# Patient Record
Sex: Male | Born: 1940 | Race: Black or African American | Hispanic: No | Marital: Single | Smoking: Former smoker
Health system: Southern US, Community
[De-identification: ages and names within clinical notes are randomized; demographics above are authoritative.]

## PROBLEM LIST (undated history)

## (undated) DIAGNOSIS — N186 End stage renal disease: Secondary | ICD-10-CM

## (undated) DIAGNOSIS — Z8679 Personal history of other diseases of the circulatory system: Secondary | ICD-10-CM

## (undated) DIAGNOSIS — I96 Gangrene, not elsewhere classified: Secondary | ICD-10-CM

## (undated) DIAGNOSIS — I639 Cerebral infarction, unspecified: Secondary | ICD-10-CM

## (undated) DIAGNOSIS — Z8673 Personal history of transient ischemic attack (TIA), and cerebral infarction without residual deficits: Secondary | ICD-10-CM

## (undated) DIAGNOSIS — Z992 Dependence on renal dialysis: Secondary | ICD-10-CM

## (undated) DIAGNOSIS — F419 Anxiety disorder, unspecified: Secondary | ICD-10-CM

## (undated) DIAGNOSIS — M869 Osteomyelitis, unspecified: Secondary | ICD-10-CM

## (undated) DIAGNOSIS — I350 Nonrheumatic aortic (valve) stenosis: Secondary | ICD-10-CM

## (undated) DIAGNOSIS — K922 Gastrointestinal hemorrhage, unspecified: Secondary | ICD-10-CM

## (undated) DIAGNOSIS — Z8719 Personal history of other diseases of the digestive system: Secondary | ICD-10-CM

## (undated) DIAGNOSIS — I739 Peripheral vascular disease, unspecified: Secondary | ICD-10-CM

## (undated) DIAGNOSIS — Z9289 Personal history of other medical treatment: Secondary | ICD-10-CM

## (undated) DIAGNOSIS — Z8711 Personal history of peptic ulcer disease: Secondary | ICD-10-CM

## (undated) DIAGNOSIS — I10 Essential (primary) hypertension: Secondary | ICD-10-CM

## (undated) DIAGNOSIS — R011 Cardiac murmur, unspecified: Secondary | ICD-10-CM

## (undated) DIAGNOSIS — M1990 Unspecified osteoarthritis, unspecified site: Secondary | ICD-10-CM

## (undated) DIAGNOSIS — D509 Iron deficiency anemia, unspecified: Secondary | ICD-10-CM

## (undated) HISTORY — DX: Personal history of transient ischemic attack (TIA), and cerebral infarction without residual deficits: Z86.73

## (undated) HISTORY — PX: ARTERIOVENOUS GRAFT PLACEMENT: SUR1029

## (undated) HISTORY — PX: SHOULDER SURGERY: SHX246

## (undated) HISTORY — DX: Personal history of other diseases of the circulatory system: Z86.79

## (undated) HISTORY — DX: Nonrheumatic aortic (valve) stenosis: I35.0

## (undated) HISTORY — DX: Iron deficiency anemia, unspecified: D50.9

## (undated) HISTORY — PX: CHOLECYSTECTOMY: SHX55

## (undated) HISTORY — PX: ESOPHAGOGASTRODUODENOSCOPY: SHX1529

## (undated) HISTORY — DX: Essential (primary) hypertension: I10

---
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
## 1998-06-27 ENCOUNTER — Encounter: Payer: Self-pay | Admitting: Nephrology

## 1998-06-27 ENCOUNTER — Ambulatory Visit (HOSPITAL_COMMUNITY): Admission: RE | Admit: 1998-06-27 | Discharge: 1998-06-27 | Payer: Self-pay | Admitting: Nephrology

## 1998-07-11 ENCOUNTER — Observation Stay (HOSPITAL_COMMUNITY): Admission: RE | Admit: 1998-07-11 | Discharge: 1998-07-12 | Payer: Self-pay | Admitting: Nephrology

## 1998-07-11 ENCOUNTER — Encounter: Payer: Self-pay | Admitting: Nephrology

## 1999-04-20 ENCOUNTER — Encounter (HOSPITAL_COMMUNITY): Admission: RE | Admit: 1999-04-20 | Discharge: 1999-07-19 | Payer: Self-pay | Admitting: Nephrology

## 1999-06-15 ENCOUNTER — Encounter: Payer: Self-pay | Admitting: Nephrology

## 1999-06-15 ENCOUNTER — Inpatient Hospital Stay (HOSPITAL_COMMUNITY): Admission: EM | Admit: 1999-06-15 | Discharge: 1999-06-20 | Payer: Self-pay | Admitting: Emergency Medicine

## 1999-06-30 ENCOUNTER — Emergency Department (HOSPITAL_COMMUNITY): Admission: EM | Admit: 1999-06-30 | Discharge: 1999-06-30 | Payer: Self-pay | Admitting: Emergency Medicine

## 1999-12-11 ENCOUNTER — Encounter: Payer: Self-pay | Admitting: Vascular Surgery

## 1999-12-11 ENCOUNTER — Ambulatory Visit (HOSPITAL_COMMUNITY): Admission: RE | Admit: 1999-12-11 | Discharge: 1999-12-11 | Payer: Self-pay | Admitting: Nephrology

## 1999-12-26 ENCOUNTER — Ambulatory Visit (HOSPITAL_COMMUNITY): Admission: RE | Admit: 1999-12-26 | Discharge: 1999-12-26 | Payer: Self-pay | Admitting: *Deleted

## 1999-12-26 ENCOUNTER — Encounter: Payer: Self-pay | Admitting: *Deleted

## 2002-04-29 HISTORY — PX: COLONOSCOPY: SHX174

## 2002-05-19 ENCOUNTER — Emergency Department (HOSPITAL_COMMUNITY): Admission: EM | Admit: 2002-05-19 | Discharge: 2002-05-19 | Payer: Self-pay | Admitting: Internal Medicine

## 2002-06-24 ENCOUNTER — Encounter: Payer: Self-pay | Admitting: Internal Medicine

## 2002-06-24 ENCOUNTER — Ambulatory Visit (HOSPITAL_COMMUNITY): Admission: RE | Admit: 2002-06-24 | Discharge: 2002-06-24 | Payer: Self-pay | Admitting: Internal Medicine

## 2002-06-25 ENCOUNTER — Emergency Department (HOSPITAL_COMMUNITY): Admission: EM | Admit: 2002-06-25 | Discharge: 2002-06-25 | Payer: Self-pay | Admitting: Anesthesiology

## 2002-07-06 ENCOUNTER — Ambulatory Visit (HOSPITAL_COMMUNITY): Admission: RE | Admit: 2002-07-06 | Discharge: 2002-07-06 | Payer: Self-pay | Admitting: General Surgery

## 2002-11-28 HISTORY — PX: ESOPHAGOGASTRODUODENOSCOPY: SHX1529

## 2002-12-12 ENCOUNTER — Inpatient Hospital Stay (HOSPITAL_COMMUNITY): Admission: EM | Admit: 2002-12-12 | Discharge: 2002-12-16 | Payer: Self-pay | Admitting: Emergency Medicine

## 2002-12-12 ENCOUNTER — Encounter: Payer: Self-pay | Admitting: Emergency Medicine

## 2002-12-13 ENCOUNTER — Encounter: Payer: Self-pay | Admitting: Internal Medicine

## 2003-02-06 ENCOUNTER — Encounter: Payer: Self-pay | Admitting: Emergency Medicine

## 2003-02-06 ENCOUNTER — Emergency Department (HOSPITAL_COMMUNITY): Admission: EM | Admit: 2003-02-06 | Discharge: 2003-02-06 | Payer: Self-pay | Admitting: Emergency Medicine

## 2003-04-05 ENCOUNTER — Ambulatory Visit (HOSPITAL_COMMUNITY): Admission: RE | Admit: 2003-04-05 | Discharge: 2003-04-05 | Payer: Self-pay | Admitting: Internal Medicine

## 2003-05-24 ENCOUNTER — Ambulatory Visit (HOSPITAL_COMMUNITY): Admission: RE | Admit: 2003-05-24 | Discharge: 2003-05-24 | Payer: Self-pay | Admitting: Nephrology

## 2003-11-17 ENCOUNTER — Emergency Department (HOSPITAL_COMMUNITY): Admission: EM | Admit: 2003-11-17 | Discharge: 2003-11-17 | Payer: Self-pay | Admitting: Internal Medicine

## 2003-11-28 ENCOUNTER — Ambulatory Visit (HOSPITAL_COMMUNITY): Admission: RE | Admit: 2003-11-28 | Discharge: 2003-11-28 | Payer: Self-pay | Admitting: Nephrology

## 2004-04-10 ENCOUNTER — Ambulatory Visit (HOSPITAL_COMMUNITY): Admission: RE | Admit: 2004-04-10 | Discharge: 2004-04-10 | Payer: Self-pay | Admitting: Nephrology

## 2004-10-25 ENCOUNTER — Emergency Department (HOSPITAL_COMMUNITY): Admission: EM | Admit: 2004-10-25 | Discharge: 2004-10-25 | Payer: Self-pay | Admitting: Emergency Medicine

## 2005-05-10 ENCOUNTER — Emergency Department (HOSPITAL_COMMUNITY): Admission: EM | Admit: 2005-05-10 | Discharge: 2005-05-10 | Payer: Self-pay | Admitting: Emergency Medicine

## 2007-03-10 ENCOUNTER — Ambulatory Visit (HOSPITAL_COMMUNITY): Admission: RE | Admit: 2007-03-10 | Discharge: 2007-03-10 | Payer: Self-pay | Admitting: Nephrology

## 2007-06-17 ENCOUNTER — Ambulatory Visit (HOSPITAL_COMMUNITY): Admission: RE | Admit: 2007-06-17 | Discharge: 2007-06-17 | Payer: Self-pay | Admitting: Medical

## 2007-09-07 ENCOUNTER — Ambulatory Visit (HOSPITAL_COMMUNITY): Admission: RE | Admit: 2007-09-07 | Discharge: 2007-09-07 | Payer: Self-pay | Admitting: Nephrology

## 2007-10-25 ENCOUNTER — Emergency Department (HOSPITAL_COMMUNITY): Admission: EM | Admit: 2007-10-25 | Discharge: 2007-10-25 | Payer: Self-pay | Admitting: Emergency Medicine

## 2007-10-29 ENCOUNTER — Ambulatory Visit (HOSPITAL_COMMUNITY): Admission: RE | Admit: 2007-10-29 | Discharge: 2007-10-29 | Payer: Self-pay | Admitting: Internal Medicine

## 2008-03-01 ENCOUNTER — Ambulatory Visit (HOSPITAL_COMMUNITY): Admission: RE | Admit: 2008-03-01 | Discharge: 2008-03-01 | Payer: Self-pay | Admitting: Urology

## 2008-09-01 ENCOUNTER — Emergency Department (HOSPITAL_COMMUNITY): Admission: EM | Admit: 2008-09-01 | Discharge: 2008-09-01 | Payer: Self-pay | Admitting: Emergency Medicine

## 2008-09-03 ENCOUNTER — Emergency Department (HOSPITAL_COMMUNITY): Admission: EM | Admit: 2008-09-03 | Discharge: 2008-09-03 | Payer: Self-pay | Admitting: Emergency Medicine

## 2008-09-08 ENCOUNTER — Ambulatory Visit (HOSPITAL_COMMUNITY): Admission: RE | Admit: 2008-09-08 | Discharge: 2008-09-08 | Payer: Self-pay | Admitting: Urology

## 2009-05-15 ENCOUNTER — Emergency Department (HOSPITAL_COMMUNITY): Admission: EM | Admit: 2009-05-15 | Discharge: 2009-05-15 | Payer: Self-pay | Admitting: Emergency Medicine

## 2009-06-27 ENCOUNTER — Ambulatory Visit: Payer: Self-pay | Admitting: Vascular Surgery

## 2009-06-28 ENCOUNTER — Ambulatory Visit: Payer: Self-pay | Admitting: Vascular Surgery

## 2009-06-28 ENCOUNTER — Ambulatory Visit (HOSPITAL_COMMUNITY): Admission: RE | Admit: 2009-06-28 | Discharge: 2009-06-28 | Payer: Self-pay | Admitting: Vascular Surgery

## 2009-07-12 ENCOUNTER — Emergency Department (HOSPITAL_COMMUNITY): Admission: EM | Admit: 2009-07-12 | Discharge: 2009-07-13 | Payer: Self-pay | Admitting: Emergency Medicine

## 2009-07-15 ENCOUNTER — Emergency Department (HOSPITAL_COMMUNITY): Admission: EM | Admit: 2009-07-15 | Discharge: 2009-07-15 | Payer: Self-pay | Admitting: Emergency Medicine

## 2009-07-18 ENCOUNTER — Ambulatory Visit: Payer: Self-pay | Admitting: Vascular Surgery

## 2009-07-27 ENCOUNTER — Ambulatory Visit: Payer: Self-pay | Admitting: Vascular Surgery

## 2009-07-27 ENCOUNTER — Ambulatory Visit (HOSPITAL_COMMUNITY): Admission: RE | Admit: 2009-07-27 | Discharge: 2009-07-27 | Payer: Self-pay | Admitting: Vascular Surgery

## 2009-08-14 ENCOUNTER — Ambulatory Visit (HOSPITAL_COMMUNITY): Admission: RE | Admit: 2009-08-14 | Discharge: 2009-08-14 | Payer: Self-pay | Admitting: Nephrology

## 2009-08-24 ENCOUNTER — Ambulatory Visit: Payer: Self-pay | Admitting: Vascular Surgery

## 2009-10-23 ENCOUNTER — Encounter (HOSPITAL_COMMUNITY): Admission: RE | Admit: 2009-10-23 | Discharge: 2009-11-22 | Payer: Self-pay | Admitting: Nephrology

## 2009-11-01 ENCOUNTER — Emergency Department (HOSPITAL_COMMUNITY): Admission: EM | Admit: 2009-11-01 | Discharge: 2009-11-01 | Payer: Self-pay | Admitting: Emergency Medicine

## 2009-11-30 ENCOUNTER — Inpatient Hospital Stay (HOSPITAL_COMMUNITY)
Admission: EM | Admit: 2009-11-30 | Discharge: 2009-12-02 | Payer: Self-pay | Source: Home / Self Care | Admitting: Emergency Medicine

## 2009-11-30 ENCOUNTER — Ambulatory Visit: Payer: Self-pay | Admitting: Cardiology

## 2009-12-01 ENCOUNTER — Encounter (INDEPENDENT_AMBULATORY_CARE_PROVIDER_SITE_OTHER): Payer: Self-pay | Admitting: Internal Medicine

## 2010-05-07 ENCOUNTER — Ambulatory Visit
Admission: RE | Admit: 2010-05-07 | Discharge: 2010-05-07 | Payer: Self-pay | Source: Home / Self Care | Attending: Surgery | Admitting: Surgery

## 2010-05-11 ENCOUNTER — Ambulatory Visit (HOSPITAL_COMMUNITY)
Admission: RE | Admit: 2010-05-11 | Discharge: 2010-05-11 | Payer: Self-pay | Source: Home / Self Care | Attending: Surgery | Admitting: Surgery

## 2010-05-14 LAB — POCT I-STAT 4, (NA,K, GLUC, HGB,HCT)
Glucose, Bld: 92 mg/dL (ref 70–99)
HCT: 53 % — ABNORMAL HIGH (ref 39.0–52.0)
Hemoglobin: 18 g/dL — ABNORMAL HIGH (ref 13.0–17.0)
Potassium: 5.2 mEq/L — ABNORMAL HIGH (ref 3.5–5.1)
Sodium: 138 mEq/L (ref 135–145)

## 2010-05-14 LAB — SURGICAL PCR SCREEN
MRSA, PCR: NEGATIVE
Staphylococcus aureus: NEGATIVE

## 2010-05-17 ENCOUNTER — Ambulatory Visit
Admission: RE | Admit: 2010-05-17 | Discharge: 2010-05-17 | Payer: Self-pay | Source: Home / Self Care | Attending: Vascular Surgery | Admitting: Vascular Surgery

## 2010-05-18 NOTE — Assessment & Plan Note (Signed)
OFFICE VISIT  [name], [name] DOB:  1940/10/25                                       05/17/2010 CHART#:[id number]  CHIEF COMPLAINT:  Bilateral foot ulcers.  HISTORY OF PRESENT ILLNESS:  The patient has a history of multiple ulcerations on both feet.  Each of these usually started as a blister. These have been persistent for 6 to 8 months.  He has been followed at the [facility] and was referred here for further evaluation of nonhealing wounds.  He denies any pain in his feet.  He denies any claudication symptoms although he seems to be minimally ambulatory.  CHRONIC MEDICAL PROBLEMS:  Include end-stage renal disease for the last 12 to 15 years.  He has dialysis on Mondays, Wednesdays and Fridays at [facility].  He also has a history of chronic neuropathy and hypertension.  All of these problems are currently controlled and followed by his nephrologist as well as the wound healing center.  ABIs were recently performed at the [facility] which showed an ABI on the right side of 0.8 and on the left 0.9.  He had, however, only toe pressures of 31 bilaterally.  PAST SURGICAL HISTORY:  Right shoulder fracture.  SOCIAL HISTORY:  He is single.  He has 2 children.  He quit smoking 5 years ago but was a heavy smoker prior to this.  He does not consume alcohol regularly.  FAMILY HISTORY:  Is not remarkable for vascular disease at age less than 8.  REVIEW OF SYSTEMS:  GENERAL:  He has had a 15 pound weight loss over the last 3 months; he thinks this is due to anorexia from antibiotics. VASCULAR:  He had a TIA 15 years ago. GI:  He had peptic ulcer disease 3 to 4 years ago but he did not require an operation. CARDIAC:  He denies any recent chest pain or shortness of breath. URINARY:  He has chronic kidney disease and is anuric. All other systems were negative.  MEDICATIONS: 1. Include Sensipar 60 mg once a day. 2.  Fosrenol 500 mg a.c. 3. Rena-Vite 1 tablet daily. 4. Gabapentin 600 mg q.h.s. 5. Clonidine 0.2 mg b.i.d. 6. Norvasc 5 mg once a day.  ALLERGIES:  Aspirin from his previous history of GI bleed.  PHYSICAL EXAMINATION:  Vital signs:  Blood pressure is 138/82 in the left arm, heart rate is 101 and regular.  Temperature is 98.  HEENT: Unremarkable.  Neck:  Has 2+ carotid pulses without bruit.  Chest: Clear to auscultation.  Cardiac:  Exam is regular rate and rhythm without murmur.  Abdomen:  Soft, nontender, nondistended.  No masses. Musculoskeletal:  He has no obvious major joint deformities. Neurologic:  Upper extremity and lower extremity motor strength is 5/5 and symmetric.  He has a decreased sensation in the feet on the plantar aspect bilaterally.  Skin:  Has a right heel ulcer which is 4 x 2 cm in diameter and is granulating at the base.  The fifth toe has gangrene. It is dry.  The first metatarsal head on the right foot has a 3 x 2 cm ulceration which has some granulation tissue.  On the left foot he has a 3 x 2 cm ulceration on the first metatarsal head which is granulating. He has a left lateral malleolus ulcer which is essentially healed  at this point.  Peripheral vascular:  He has 2+ radial pulses bilaterally. He has a healing incision on his right wrist from recent fistula ligation for steal.  He is currently using a hemodialysis catheter. Lower extremities:  He has 2+ femoral and 2+ popliteal pulses bilaterally.  He has absent pedal pulses bilaterally.  I reviewed his noninvasive studies from [facility] which showed the ABIs as listed above as well as toe pressures.  His noninvasive testing suggested primarily tibial or possible small vessel disease.  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  I believe the best option for him would be aortogram, bilateral  lower extremity runoff, possible angioplasty and stenting if possible or possible consider direct revascularization. There may also be a possibility he has no distal targets or small vessel disease alone.  I discussed the procedure risks, benefits, possible complications with the patient today.  We have scheduled him for an arteriogram with lower extremity runoff, possible angioplasty and stenting with my partner, Dr. Bridgett Larsson, on a nondialysis day.  This is scheduled for 05/24/2010.    Jessy Oto. Fields, MD Electronically Signed  CEF/MEDQ  D:  05/17/2010  T:  05/18/2010  Job:  [id number]  cc:   Dr Belva Bertin

## 2010-05-20 ENCOUNTER — Encounter: Payer: Self-pay | Admitting: Nephrology

## 2010-05-21 ENCOUNTER — Encounter: Payer: Self-pay | Admitting: Urology

## 2010-05-24 ENCOUNTER — Ambulatory Visit (HOSPITAL_COMMUNITY)
Admission: RE | Admit: 2010-05-24 | Discharge: 2010-05-24 | Payer: Self-pay | Source: Home / Self Care | Attending: Vascular Surgery | Admitting: Vascular Surgery

## 2010-05-24 LAB — POCT ACTIVATED CLOTTING TIME
Activated Clotting Time: 193 seconds
Activated Clotting Time: 211 seconds

## 2010-05-24 LAB — POCT I-STAT, CHEM 8
BUN: 44 mg/dL — ABNORMAL HIGH (ref 6–23)
Chloride: 107 mEq/L (ref 96–112)
Creatinine, Ser: 12.1 mg/dL — ABNORMAL HIGH (ref 0.4–1.5)
HCT: 52 % (ref 39.0–52.0)
Potassium: 5.9 mEq/L — ABNORMAL HIGH (ref 3.5–5.1)
Sodium: 137 mEq/L (ref 135–145)
TCO2: 26 mmol/L (ref 0–100)

## 2010-05-24 LAB — BASIC METABOLIC PANEL: CO2: 23 mEq/L (ref 19–32)

## 2010-05-27 NOTE — Op Note (Addendum)
[name], [name]                   ACCOUNT NO.:  [id number]  MEDICAL RECORD NO.:  [id number]          PATIENT TYPE:  AMB  LOCATION:  SDS                          FACILITY:  [facility]  PHYSICIAN:  Joseph Katherine, MD       DATE OF BIRTH:  04-11-1941  DATE OF PROCEDURE:  05/24/2010 DATE OF DISCHARGE:  05/24/2010                              OPERATIVE REPORT   PROCEDURES: 1. Left common femoral artery cannulation under ultrasound guidance. 2. Aortogram. 3. Bilateral leg runoff. 4. Third-order arterial selection. 5. Angioplasty of the right anterior tibial artery x 3.  PREOPERATIVE DIAGNOSIS:  Bilateral foot ulcers.  POSTOPERATIVE DIAGNOSIS:  Bilateral foot ulcers.  SURGEON:  Joseph Goodyear, MD  ESTIMATED BLOOD LOSS:  50 mL.  CONTRAST:  235 mL.  FINDINGS:  Included: 1. A patent aorta that was heavily calcified. 2. Bilateral renal arteries are occluded distally without any obvious     nephrograms. 3. Bilateral common iliac, internal and external iliac arteries are     patent, but heavily calcified. 4. Patent bilateral common superficial and profunda femoral arteries. 5. Bilateral superficial femoral artery is heavily diseased.  In the     right superficial femoral artery, there is an area with about 50%     stenosis. 6. Bilateral popliteal arteries are patent but heavily diseased. 7. Bilateral tibial arteries are diseased heavily with the right     posterior tibial occluded after takeoff. 8. The right anterior tibial has multiple occlusions with     reconstitution distally as the only runoff, obviously not in     continuity with blood flow. 9. The forefoot and the right foot is supplied via anterior tibial     artery that is only about 2.5 mm in diameter. 10.There is a resolution of multiple anterior tibial occlusions with a     repeat angioplasty first with a 2.0 mm x 220 mm and then followed     by a 3.0 mm x 220 mm balloon. 11.Residual right anterior tibial orificial stenosis  about 50% that     was not responsive to angioplasty with a 4 mm x 20 mm angioplasty     balloon x2. 12.The left tibial arteries are all occluded shortly after takeoff. 13.There is reconstitution of a left anterior tibial via collaterals. 14.Left anterior tibial is the only runoff to the foot with limited     visualization of the foot perfusion, obviously no in-line     continuity of flow.  INDICATIONS:  This is a 70 year old gentleman with bilateral foot ulcers which were apparently previously healing, but then recently, the ulcerations have gotten worse.  His right leg is worse than his left, so the plan was to intervene on his right leg first.  He is aware of the risk of this procedure, which includes bleeding, infection, possible embolization, possible access complications, possible need for emergent surgical intervention, possible embolization, and possible need for additional procedures.  He agreed to proceed forward with the procedure regardless of these risks.  DESCRIPTION OF OPERATION:  After full informed written consent was obtained from the patient,  he was brought back to the angio suite and placed supine upon the angio table.  He was connected to monitoring equipment.  Then, he was given conscious sedation, amounts of which are documented in his chart.  The patient was then prepped and draped in standard fashion for aortogram bilateral leg runoff.  I turned my attention first to his left groin as this was a right leg intervention. Under ultrasound guidance, I cannulated the left common femoral artery. Under ultrasound guidance, I passed the Bentson wire up into the aorta. The needle was exchanged for a 5-French sheath and I then exchanged the dilator for an Omniflush catheter, which was loaded to the level of L1. A wire was removed.  The catheter was connected to power injector circuit after performing the declotting deairing maneuver.  The power injector aortogram  findings are as listed above.  I then pulled down the catheter to just proximal to the bifurcation and then bilateral leg automated runoff was completed.  Due to the flow disturbances on both sides; however, the tibials were not well imaged.  It was also evident from the findings that his only true runoff to his right foot was the anterior tibial, which had multiple areas of occlusion.  As per discussion with Dr. Oneida [name], this patient is not a great candidate for intervention.  I felt the attempt at re-cannulating his right anterior tibial artery was indicated.  At this point, using a Bentson wire and Omniflush, I selected out the right common iliac artery and then using these two, I was able to advance down into the superficial femoral artery.  The wire was exchanged for a Rosen wire and the catheter was removed at this point and I exchanged the sheath out for a 6-French Ansel sheath, which was advanced over the bifurcation under fluoroscopic guidance.  This was lodged down into the superficial femoral artery without any difficulties.  I then game a 80 mg/kg bolus of Heparin intravenously to obtain therapeutic anticoagulation.  I then removed the Rosen wire and then connected the sheath to the power injector.  We did then some dedicated tibial images and a dedicated lateral foot found to fully image the tibias and the foot profusion.  Based on this, I felt again that an attempt at re-cannulating anterior tibial was necessary.  I at this point obtained a Cook exchange catheter and a 0.018" Spartacore wire and using these two, I was able to navigate down into the anterior tibial artery.  Using this combination, I was able to get through the occlusions and get down onto the dorsum of the right foot and anterior tibial artery.  At this point, the exchange catheter was removed.  Then I obtained a 2-mm x 220-mm angioplasty balloon.  This was placed and inflated twice along the length of the  anterior tibial artery to treat the areas of occlusion. The balloon was removed.  A hand injection demonstrated continued stenoses in this artery, so then we placed 3-mm x 220-mm balloon and treated the length of this anterior tibial artery with 2 inflations to treat the entire length.  Once again, the balloon was removed and then hand injections demonstrated resolution of most of the occlusions in this right anterior tibial artery.  At the orifice of the anterior tibial takeoff of the trifurcation; however, there were remained a 50% stenosis at the orifice.  I obtained a 4-mm x 20-mm balloon and centered it around this orificial stenosis and inflated it to 10 atmospheres for  a minute.  I did this twice in between injecting using hand injections to interrogate the artery.  There was absolutely no response to the angioplasties. Based on the size measurement of the digital caliper, I did not think it was safe to attempt a 5-mm dilation here.  My suspicion is that there is heavy calcification at this anterior tibial takeoff and this would be best treated with a Diamondback orbital arthrectomy device.  At this point, we completed some additional runoff films to make sure we did not embolize distally.  The anterior tibial artery remained open, and there was good flow down to the foot.  I then pulled the wire out and then pulled the sheath back into the left external iliac artery under fluoroscopic guidance.  The sheath was then connected to the power injector and then retrograde shots were completed to interrogate the tibial on the left side and also to foot.  The findings of which are listed above.  At this point, the plan was to pull this left sheath after the patient's anticoagulation has fully reversed and then he will be discharged after 4-hour bedrest.  COMPLICATIONS:  None.  CONDITION:  Stable.  PLAN:  The plan in this patient is to allow him to recover from this procedure.  He may  possibly need some additional treatment with orbital arthrectomy to the left anterior tibial artery orifice and possibly the superficial femoral artery to treat the 50% stenosis.      Joseph Geneva, MD     BLC/MEDQ  D:  05/24/2010  T:  05/25/2010  Job:  [id number]  Electronically Signed by Adele Barthel MD on 05/27/2010 03:06:12 PM

## 2010-06-04 ENCOUNTER — Ambulatory Visit: Payer: Self-pay | Admitting: Surgery

## 2010-06-10 NOTE — Op Note (Addendum)
[name], [name]                   ACCOUNT NO.:  [id number]  MEDICAL RECORD NO.:  [id number]          PATIENT TYPE:  AMB  LOCATION:  SDS                          FACILITY:  [facility]  PHYSICIAN:  Theotis Burrow IV, MDDATE OF BIRTH:  1940/08/05  DATE OF PROCEDURE:  05/11/2010 DATE OF DISCHARGE:  05/11/2010                              OPERATIVE REPORT   PREOPERATIVE DIAGNOSES:  End-stage renal disease, right arm steal syndrome.  POSTOPERATIVE DIAGNOSES:  End-stage renal disease, right arm steal syndrome.  PROCEDURE PERFORMED: 1. Ultrasound-guided placement of right internal jugular Diatek     catheter. 2. Ligation of right arteriovenous fistula.  ANESTHESIA:  MAC.  COMPLICATIONS:  None.  INDICATIONS:  Mr. Krajewski is a 70 year old gentleman who has had a right radiocephalic fistula placed several months ago.  He has developed progressive discomfort in his hand and now has nonhealing ulcers on his fingers.  Ultrasound in the office revealed a 50-mm gradient between his radial artery with and without graft compression.  I strongly feel that his fistula is contributing to the nonhealing nature of his wounds, and I have recommended that he undergo fistula ligation.  PROCEDURE:  The patient was identified in the holding area and taken to room six, placed supine on the table.  MAC anesthesia was administered. He was prepped and draped in usual fashion.  A time-out was called. Antibiotics were given.  I initially began by placing the Diatek catheter.  The right internal jugular vein was evaluated with ultrasound.  It was found to be easily compressible and widely patent. A 11 blade was used to make a skin incision.  Lidocaine 1% was used for local anesthesia.  The right internal jugular vein was then cannulated under ultrasound guidance with an 18 gauge needle.  An 0.035 wire was advanced easily into the inferior vena cava under fluoroscopic visualization.  Subcutaneous track was  dilated with sequential dilators and peel-away sheath was placed.  Next, a 23-cm Diatek catheter was advanced through the peel-away sheath, which was removed.  A site was then selected with a skin exit site.  This area was anesthetized and opened with an 11 blade.  Subcutaneous tunnel was then created and then dilated.  The catheter was brought through the tunnel.  The cuff was situated at the skin exit site.  Fluoroscopy was used to confirm that the catheter tip was in good position.  The catheter was connected to the ports.  Both ports flushed and aspirated without difficulty.  The catheter was sewn in place with 3-0 nylon.  Skin incisions in the neck was closed with 4-0 Vicryl.  Catheter was then filled with the appropriate volumes of  heparin.  Sterile dressing was applied.  Next, attention was turned towards the right arm, which was prepped and draped sterilely.  Lidocaine 1% was used for local anesthesia.  The patient's previous skin incision was opened with #10 blade.  Cautery was used to dissect through the subcutaneous tissue.  The fistula was identified and dissected out down to near the arteriovenous anastomosis.  Once adequate exposure was obtained, Baby Gregory clamps were  placed proximally and distally on the fistula.  The fistula was then transected with an 11 blade.  Both ends were oversewn in two layers with 5-0 Prolene.  The wound was irrigated and hemostasis was achieved.  The deep tissue was closed with 3-0 Vicryl. Skin was closed with 3-0 Vicryl.  Dermabond was placed on the wound. The patient tolerated the procedure well.  There were no complications.     Eldridge Abrahams, MD     VWB/MEDQ  D:  05/12/2010  T:  05/13/2010  Job:  [id number]  Electronically Signed by Orvan Falconer IV MD on 06/10/2010 09:57:38 PM

## 2010-06-15 ENCOUNTER — Ambulatory Visit: Payer: Self-pay | Admitting: Vascular Surgery

## 2010-06-15 ENCOUNTER — Ambulatory Visit (INDEPENDENT_AMBULATORY_CARE_PROVIDER_SITE_OTHER): Payer: BC Managed Care – PPO | Admitting: Vascular Surgery

## 2010-06-15 ENCOUNTER — Encounter (INDEPENDENT_AMBULATORY_CARE_PROVIDER_SITE_OTHER): Payer: BC Managed Care – PPO

## 2010-06-15 DIAGNOSIS — L98499 Non-pressure chronic ulcer of skin of other sites with unspecified severity: Secondary | ICD-10-CM

## 2010-06-15 DIAGNOSIS — Z48812 Encounter for surgical aftercare following surgery on the circulatory system: Secondary | ICD-10-CM

## 2010-06-15 DIAGNOSIS — I739 Peripheral vascular disease, unspecified: Secondary | ICD-10-CM

## 2010-06-21 NOTE — Assessment & Plan Note (Addendum)
OFFICE VISIT  KHAMAURI, RONDEAU DOB:  29-May-1940                                       06/04/2010 CHART#:[id number]  The patient was supposed to come back today for a 3 week followup following ligation of his fistula.  He did not show up today.    Eldridge Abrahams, MD Electronically Signed  VWB/MEDQ  D:  06/04/2010  T:  06/05/2010  Job:  [id number]

## 2010-06-25 ENCOUNTER — Ambulatory Visit (HOSPITAL_COMMUNITY)
Admission: RE | Admit: 2010-06-25 | Discharge: 2010-06-25 | Disposition: A | Discharge status: Home / Self Care | Payer: Medicare Other | Source: Ambulatory Visit | Attending: Vascular Surgery | Admitting: Vascular Surgery

## 2010-06-25 DIAGNOSIS — I70269 Atherosclerosis of native arteries of extremities with gangrene, unspecified extremity: Secondary | ICD-10-CM

## 2010-06-25 LAB — POCT I-STAT, CHEM 8
BUN: 44 mg/dL — ABNORMAL HIGH (ref 6–23)
Calcium, Ion: 1.03 mmol/L — ABNORMAL LOW (ref 1.12–1.32)
Creatinine, Ser: 13.8 mg/dL — ABNORMAL HIGH (ref 0.4–1.5)
Glucose, Bld: 97 mg/dL (ref 70–99)
HCT: 46 % (ref 39.0–52.0)
Hemoglobin: 15.6 g/dL (ref 13.0–17.0)
TCO2: 19 mmol/L (ref 0–100)

## 2010-06-25 LAB — POCT ACTIVATED CLOTTING TIME: Activated Clotting Time: 187 seconds

## 2010-06-27 NOTE — Op Note (Signed)
[name], [name]                   ACCOUNT NO.:  [id number]  MEDICAL RECORD NO.:  [id number]           PATIENT TYPE:  O  LOCATION:  SDSC                         FACILITY:  [facility]  PHYSICIAN:  Conrad Marengo, MD       DATE OF BIRTH:  Dec 23, 1940  DATE OF PROCEDURE:  06/25/2010 DATE OF DISCHARGE:  06/25/2010                              OPERATIVE REPORT   PROCEDURE:   1. Left common femoral artery cannulation under ultrasound guidance,  2. Third order arterial selection,  3. Right leg angiogram,  4. Orbital arthrectomy of the right anterior tibial artery with a Diamondback stealth device with a 1.25 Predator,  5. Angioplasty right anterior tibial artery with a 4 mm x 20-mm balloon.  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  SURGEON:  Aaron Edelman L. Bridgett Larsson, MD  ANESTHESIA:  Conscious sedation.  ESTIMATED BLOOD LOSS:  Minimal.  CONTRAST:  55 mL.  FINDINGS: 1. Patent right anterior tibial artery with orificial stenosis greater than 50%  2. Multiple right anterior tibial artery stenoses less than 50%, greatly improved from previous study,  3. In-line flow to the right dorsal arch  4. Resolution of the right anterior tibial artery orificial stenosis with orbital arthrectomy at 2 runs of 60,000 revolutions per minute and 2 runs of 90,000 revolutions per minute, followed by angioplasty with a 4-mm x 20-mm balloon.  INDICATIONS:  This is a 70 year old gentleman with right foot gangrene. Previously, I was asked to angio this gentleman to try to salvage his right foot.  At that sitting he had extensive atherosclerotic disease in his tibials.  His only in-line artery was his right anterior tibial artery.  There was multiple high-grade stenoses near 90% in his artery. I was able to traverse the stenoses and angioplasty most of his anterior tibial artery,  however at the orifice of its anterior tibial there was a calcified lesion that was resistant to multiple angioplasties including a 4-mm angioplasty balloon.  We allowed the patient to heal for a few weeks  and reevaluated him in clinic.   At that point he continued to have gangrenous  changes in the right foot with no clear evidence of him healing in his right foot,  even though the ABIs demonstrated approximately a 10% increase in his blood flow in that foot, it was felt that additional intervention was going to be necessary to give this gentleman any chance of healing his multiple ulcerations in his foot.  Based on the findings from previous, I suggested an orbital arthrectomy at its orificial lesion may give him a chance at successfully angioplasty of this lesion, so we discussed the risk of this procedure which included access complications, possible embolization, possible rupture of the treated arteries, possible need for emergent surgical operation and also possible need for additional procedure.  He is aware of the risks and agreed to proceed forward such.  DESCRIPTION OF THE OPERATION:  After full informed written consent was obtained from the patient, he was  brought back to the angio suite, and placed supine upon the angio table.  Prior to conscious sedation, he was connected to monitoring equipment and then given conscious sedation, amounts of which are documented in the chart.  After anesthetizing the left groin, under ultrasound guidance, I cannulated the left common femoral artery and advanced a J-wire up into his iliac system.  The needle was exchanged for a 6-French Ansel sheath which was advanced to the level of his common iliac artery.  The dilator was then removed and then a crossover catheter was loaded over the wire.  Using the crossover catheter in his J-wire, I was able to select out the right common iliac artery and advance down into the iliac system.  Using this  combination, I was actually able to get the sheath across over the bifurcation down to the level of the external iliac artery.  At this point I was no longer able to get the J-wire to advance, so I exchanged the J-wire out for a Bentson wire.  The Bentson wire was able to advance down into the superficial femoral artery and then I took out the catheter and loaded the dilator for the Ansel sheath back over the wire and advanced the sheath all the way down into the superficial femoral artery hubbing the catheter into process.  The patient was given 6500 units of Heparin intravenously to get therapeutic anticoagulation. At this point a Quick-Cross catheter was loaded over the wire and the wire was exchanged out for a 0.014" ViperWire which was loaded through the [employer] catheter.  Using this combination, we were able to traverse the superficial femoral artery and get down to the level of the popliteal artery.  We then did hand injections to the sheath demonstrating the orifice of the anterior tibial artery.  Using the ViperWire and the [employer], I was able to get into the anterior tibial artery.  I was able to advance the wire all the way down into the dorsum of the foot.  I then did hand injections through the sheath which demonstrated a widely patent anterior tibial artery with multiple stenoses less than 30%.  Dr. Oneida Alar was present and reviewed the films with me and together we decided that the stenoses in the anterior tibial artery were not sufficient to merit any type of intervention, however, the greater than 50% orificial stenosis of the anterior tibial needed to be treated, so at this point the Quick-Cross wire was removed.  The [employer] device was loaded with a 1.25 Predator drill.  This was placed over the wire and advanced down to the level of the orifice of the anterior tibial artery.  I did another repeat hand injection to once again verify the position at the  anterior tibial artery and then using orbital arthrectomy device, I treated the orifice with 2 runs at low velocity at 60,000 rpms and then I treated it twice with 90,000 rpms and then did a completion angio.  At this point there appeared to be some improvement but there was degree of haziness still at the orifice that raised concern that in fact there was still some residual stenosis here, so I obtained a 4-mm x 20-mm balloon and inflated it, centered around the orifice at the right anterior tibial artery.  This balloon inflated with actually no waste whatsoever at nominal 8 atmospheres.  The balloon was deflated and removed and then hand injections were completed.  This demonstrated complete resolution of the right anterior  tibial artery orificial stenosis.  At this point, the balloon was removed along with the ViperWire.  The sheath was then pulled back into the left external iliac artery with a plan to remove the sheath once his anticoagulation had completely reversed.    Complications were none.    Condition was stable.     Conrad , MD     BLC/MEDQ  D:  06/25/2010  T:  06/26/2010  Job:  [id number]  Electronically Signed by Adele Barthel MD on 06/27/2010 08:52:53 AM

## 2010-07-13 ENCOUNTER — Emergency Department (HOSPITAL_COMMUNITY): Payer: Medicare Other

## 2010-07-13 ENCOUNTER — Emergency Department (HOSPITAL_COMMUNITY)
Admission: EM | Admit: 2010-07-13 | Discharge: 2010-07-13 | Disposition: A | Discharge status: Home / Self Care | Payer: Medicare Other | Attending: Emergency Medicine | Admitting: Emergency Medicine

## 2010-07-13 DIAGNOSIS — N189 Chronic kidney disease, unspecified: Secondary | ICD-10-CM | POA: Insufficient documentation

## 2010-07-13 DIAGNOSIS — Z86718 Personal history of other venous thrombosis and embolism: Secondary | ICD-10-CM | POA: Insufficient documentation

## 2010-07-13 DIAGNOSIS — R109 Unspecified abdominal pain: Secondary | ICD-10-CM | POA: Insufficient documentation

## 2010-07-13 DIAGNOSIS — L909 Atrophic disorder of skin, unspecified: Secondary | ICD-10-CM | POA: Insufficient documentation

## 2010-07-13 DIAGNOSIS — L919 Hypertrophic disorder of the skin, unspecified: Secondary | ICD-10-CM | POA: Insufficient documentation

## 2010-07-13 LAB — BASIC METABOLIC PANEL
BUN: 11 mg/dL (ref 6–23)
Glucose, Bld: 93 mg/dL (ref 70–99)
Sodium: 141 mEq/L (ref 135–145)

## 2010-07-13 LAB — POCT CARDIAC MARKERS
CKMB, poc: 2.9 ng/mL (ref 1.0–8.0)
Troponin i, poc: <0.05 ng/mL (ref 0.00–0.09)

## 2010-07-13 LAB — CBC
HCT: 39.7 % (ref 39.0–52.0)
Hemoglobin: 12.4 g/dL — ABNORMAL LOW (ref 13.0–17.0)
Hemoglobin: 14.9 g/dL (ref 13.0–17.0)
MCH: 22 pg — ABNORMAL LOW (ref 26.0–34.0)
MCHC: 32.9 g/dL (ref 30.0–36.0)
RBC: 6.91 MIL/uL — ABNORMAL HIGH (ref 4.22–5.81)
WBC: 9.3 10*3/uL (ref 4.0–10.5)
WBC: 7.9 10*3/uL (ref 4.0–10.5)

## 2010-07-13 LAB — DIFFERENTIAL
Basophils Absolute: 0 10*3/uL (ref 0.0–0.1)
Basophils Absolute: 0 10*3/uL (ref 0.0–0.1)
Basophils Relative: 0 % (ref 0–1)
Eosinophils Absolute: 0.4 10*3/uL (ref 0.0–0.7)
Lymphocytes Relative: 19 % (ref 12–46)
Lymphs Abs: 1.8 10*3/uL (ref 0.7–4.0)
Monocytes Absolute: 0.6 10*3/uL (ref 0.1–1.0)
Neutro Abs: 6.4 10*3/uL (ref 1.7–7.7)
Neutro Abs: 5 10*3/uL (ref 1.7–7.7)
Neutrophils Relative %: 69 % (ref 43–77)
Neutrophils Relative %: 63 % (ref 43–77)

## 2010-07-13 LAB — LIPID PANEL
HDL: 31 mg/dL — ABNORMAL LOW (ref >39)
LDL Cholesterol: 85 mg/dL (ref 0–99)
Triglycerides: 122 mg/dL (ref <150)

## 2010-07-13 LAB — CARDIAC PANEL(CRET KIN+CKTOT+MB+TROPI)
CK, MB: 5.3 ng/mL — ABNORMAL HIGH (ref 0.3–4.0)
Relative Index: 4.7 — ABNORMAL HIGH (ref 0.0–2.5)
Relative Index: 5.2 — ABNORMAL HIGH (ref 0.0–2.5)
Troponin I: 0.27 ng/mL — ABNORMAL HIGH (ref 0.00–0.06)

## 2010-07-13 LAB — COMPREHENSIVE METABOLIC PANEL
ALT: <8 U/L (ref 0–53)
AST: 19 U/L (ref 0–37)
CO2: 29 mEq/L (ref 19–32)
Calcium: 9.1 mg/dL (ref 8.4–10.5)
GFR calc Af Amer: 10 mL/min — ABNORMAL LOW (ref >60)
GFR calc non Af Amer: 8 mL/min — ABNORMAL LOW (ref >60)
Potassium: 3.9 mEq/L (ref 3.5–5.1)
Sodium: 139 mEq/L (ref 135–145)

## 2010-07-13 LAB — D-DIMER, QUANTITATIVE: D-Dimer, Quant: 2.95 ug/mL-FEU — ABNORMAL HIGH (ref 0.00–0.48)

## 2010-07-13 LAB — BRAIN NATRIURETIC PEPTIDE: Pro B Natriuretic peptide (BNP): >3200 pg/mL — ABNORMAL HIGH (ref 0.0–100.0)

## 2010-07-13 MED ORDER — IOHEXOL 300 MG/ML  SOLN
100.0000 mL | Freq: Once | INTRAMUSCULAR | Status: AC | PRN
  Administered 2010-07-13: 100 mL via INTRAVENOUS

## 2010-07-14 ENCOUNTER — Emergency Department (HOSPITAL_COMMUNITY)
Admission: EM | Admit: 2010-07-14 | Discharge: 2010-07-14 | Disposition: A | Discharge status: Home / Self Care | Payer: Medicare Other | Attending: Emergency Medicine | Admitting: Emergency Medicine

## 2010-07-14 DIAGNOSIS — R109 Unspecified abdominal pain: Secondary | ICD-10-CM | POA: Insufficient documentation

## 2010-07-14 DIAGNOSIS — Z7901 Long term (current) use of anticoagulants: Secondary | ICD-10-CM | POA: Insufficient documentation

## 2010-07-14 DIAGNOSIS — I129 Hypertensive chronic kidney disease with stage 1 through stage 4 chronic kidney disease, or unspecified chronic kidney disease: Secondary | ICD-10-CM | POA: Insufficient documentation

## 2010-07-14 DIAGNOSIS — Z86718 Personal history of other venous thrombosis and embolism: Secondary | ICD-10-CM | POA: Insufficient documentation

## 2010-07-14 DIAGNOSIS — N189 Chronic kidney disease, unspecified: Secondary | ICD-10-CM | POA: Insufficient documentation

## 2010-07-14 DIAGNOSIS — K429 Umbilical hernia without obstruction or gangrene: Secondary | ICD-10-CM | POA: Insufficient documentation

## 2010-07-17 ENCOUNTER — Emergency Department (HOSPITAL_COMMUNITY)
Admission: EM | Admit: 2010-07-17 | Discharge: 2010-07-17 | Disposition: A | Discharge status: Home / Self Care | Payer: Medicare Other | Attending: Emergency Medicine | Admitting: Emergency Medicine

## 2010-07-17 DIAGNOSIS — I129 Hypertensive chronic kidney disease with stage 1 through stage 4 chronic kidney disease, or unspecified chronic kidney disease: Secondary | ICD-10-CM | POA: Insufficient documentation

## 2010-07-17 DIAGNOSIS — Z992 Dependence on renal dialysis: Secondary | ICD-10-CM | POA: Insufficient documentation

## 2010-07-17 DIAGNOSIS — N189 Chronic kidney disease, unspecified: Secondary | ICD-10-CM | POA: Insufficient documentation

## 2010-07-17 DIAGNOSIS — Z86718 Personal history of other venous thrombosis and embolism: Secondary | ICD-10-CM | POA: Insufficient documentation

## 2010-07-17 DIAGNOSIS — R1033 Periumbilical pain: Secondary | ICD-10-CM | POA: Insufficient documentation

## 2010-07-17 LAB — CBC
Hemoglobin: 13 g/dL (ref 13.0–17.0)
MCH: 22 pg — ABNORMAL LOW (ref 26.0–34.0)
MCV: 71 fL — ABNORMAL LOW (ref 78.0–100.0)
RBC: 5.9 MIL/uL — ABNORMAL HIGH (ref 4.22–5.81)

## 2010-07-17 LAB — DIFFERENTIAL
Eosinophils Absolute: 0.4 10*3/uL (ref 0.0–0.7)
Lymphs Abs: 2.5 10*3/uL (ref 0.7–4.0)
Monocytes Relative: 8 % (ref 3–12)
Neutro Abs: 5.2 10*3/uL (ref 1.7–7.7)
Neutrophils Relative %: 59 % (ref 43–77)

## 2010-07-17 LAB — COMPREHENSIVE METABOLIC PANEL
BUN: 22 mg/dL (ref 6–23)
CO2: 26 mEq/L (ref 19–32)
Chloride: 98 mEq/L (ref 96–112)
Creatinine, Ser: 9.15 mg/dL — ABNORMAL HIGH (ref 0.4–1.5)
GFR calc non Af Amer: 6 mL/min — ABNORMAL LOW (ref >60)
Total Bilirubin: 0.6 mg/dL (ref 0.3–1.2)

## 2010-07-22 ENCOUNTER — Emergency Department (HOSPITAL_COMMUNITY): Payer: Medicare Other

## 2010-07-22 ENCOUNTER — Emergency Department (HOSPITAL_COMMUNITY)
Admission: EM | Admit: 2010-07-22 | Discharge: 2010-07-22 | Disposition: A | Discharge status: Home / Self Care | Payer: Medicare Other | Attending: Emergency Medicine | Admitting: Emergency Medicine

## 2010-07-22 DIAGNOSIS — K5900 Constipation, unspecified: Secondary | ICD-10-CM | POA: Insufficient documentation

## 2010-07-22 DIAGNOSIS — Z992 Dependence on renal dialysis: Secondary | ICD-10-CM | POA: Insufficient documentation

## 2010-07-22 DIAGNOSIS — R630 Anorexia: Secondary | ICD-10-CM | POA: Insufficient documentation

## 2010-07-22 DIAGNOSIS — I129 Hypertensive chronic kidney disease with stage 1 through stage 4 chronic kidney disease, or unspecified chronic kidney disease: Secondary | ICD-10-CM | POA: Insufficient documentation

## 2010-07-22 DIAGNOSIS — N189 Chronic kidney disease, unspecified: Secondary | ICD-10-CM | POA: Insufficient documentation

## 2010-07-22 DIAGNOSIS — Z79899 Other long term (current) drug therapy: Secondary | ICD-10-CM | POA: Insufficient documentation

## 2010-07-22 DIAGNOSIS — R1033 Periumbilical pain: Secondary | ICD-10-CM | POA: Insufficient documentation

## 2010-07-22 DIAGNOSIS — Z86718 Personal history of other venous thrombosis and embolism: Secondary | ICD-10-CM | POA: Insufficient documentation

## 2010-07-22 LAB — CBC
Hemoglobin: 13.1 g/dL (ref 13.0–17.0)
MCHC: 31.1 g/dL (ref 30.0–36.0)
RBC: 6 MIL/uL — ABNORMAL HIGH (ref 4.22–5.81)

## 2010-07-22 LAB — DIFFERENTIAL
Basophils Absolute: 0.1 10*3/uL (ref 0.0–0.1)
Basophils Relative: 1 % (ref 0–1)
Eosinophils Absolute: 0.3 10*3/uL (ref 0.0–0.7)
Eosinophils Relative: 3 % (ref 0–5)
Lymphocytes Relative: 26 % (ref 12–46)
Lymphs Abs: 2.3 10*3/uL (ref 0.7–4.0)
Monocytes Absolute: 0.6 10*3/uL (ref 0.1–1.0)
Monocytes Relative: 7 % (ref 3–12)
Neutro Abs: 5.8 10*3/uL (ref 1.7–7.7)
Neutrophils Relative %: 64 % (ref 43–77)

## 2010-07-22 LAB — COMPREHENSIVE METABOLIC PANEL
ALT: <8 U/L (ref 0–53)
AST: 25 U/L (ref 0–37)
Albumin: 3.2 g/dL — ABNORMAL LOW (ref 3.5–5.2)
CO2: 25 mEq/L (ref 19–32)
Calcium: 8.1 mg/dL — ABNORMAL LOW (ref 8.4–10.5)
Chloride: 99 mEq/L (ref 96–112)
GFR calc Af Amer: 6 mL/min — ABNORMAL LOW (ref >60)
GFR calc non Af Amer: 5 mL/min — ABNORMAL LOW (ref >60)
Sodium: 135 mEq/L (ref 135–145)

## 2010-07-23 LAB — POCT I-STAT 4, (NA,K, GLUC, HGB,HCT)
Glucose, Bld: 102 mg/dL — ABNORMAL HIGH (ref 70–99)
HCT: 58 % — ABNORMAL HIGH (ref 39.0–52.0)
Hemoglobin: 19.7 g/dL — ABNORMAL HIGH (ref 13.0–17.0)
Potassium: 4.5 mEq/L (ref 3.5–5.1)

## 2010-07-23 LAB — HEMOGLOBIN AND HEMATOCRIT, BLOOD: Hemoglobin: 15.2 g/dL (ref 13.0–17.0)

## 2010-07-30 ENCOUNTER — Other Ambulatory Visit (HOSPITAL_COMMUNITY): Payer: Self-pay | Admitting: Internal Medicine

## 2010-08-02 ENCOUNTER — Ambulatory Visit (HOSPITAL_COMMUNITY)
Admission: RE | Admit: 2010-08-02 | Discharge: 2010-08-02 | Disposition: A | Discharge status: Home / Self Care | Payer: Medicare Other | Source: Ambulatory Visit | Attending: Internal Medicine | Admitting: Internal Medicine

## 2010-08-02 ENCOUNTER — Other Ambulatory Visit (HOSPITAL_COMMUNITY): Payer: Self-pay | Admitting: Internal Medicine

## 2010-08-02 DIAGNOSIS — K769 Liver disease, unspecified: Secondary | ICD-10-CM | POA: Insufficient documentation

## 2010-08-02 DIAGNOSIS — K8020 Calculus of gallbladder without cholecystitis without obstruction: Secondary | ICD-10-CM | POA: Insufficient documentation

## 2010-08-02 DIAGNOSIS — Z992 Dependence on renal dialysis: Secondary | ICD-10-CM | POA: Insufficient documentation

## 2010-08-02 DIAGNOSIS — R109 Unspecified abdominal pain: Secondary | ICD-10-CM | POA: Insufficient documentation

## 2010-08-03 ENCOUNTER — Emergency Department (HOSPITAL_COMMUNITY)
Admission: EM | Admit: 2010-08-03 | Discharge: 2010-08-03 | Disposition: A | Discharge status: Home / Self Care | Payer: Medicare Other | Attending: Emergency Medicine | Admitting: Emergency Medicine

## 2010-08-03 DIAGNOSIS — R1013 Epigastric pain: Secondary | ICD-10-CM | POA: Insufficient documentation

## 2010-08-03 DIAGNOSIS — N186 End stage renal disease: Secondary | ICD-10-CM | POA: Insufficient documentation

## 2010-08-03 DIAGNOSIS — I120 Hypertensive chronic kidney disease with stage 5 chronic kidney disease or end stage renal disease: Secondary | ICD-10-CM | POA: Insufficient documentation

## 2010-08-03 DIAGNOSIS — R1011 Right upper quadrant pain: Secondary | ICD-10-CM | POA: Insufficient documentation

## 2010-08-03 DIAGNOSIS — Z992 Dependence on renal dialysis: Secondary | ICD-10-CM | POA: Insufficient documentation

## 2010-08-03 DIAGNOSIS — R110 Nausea: Secondary | ICD-10-CM | POA: Insufficient documentation

## 2010-08-03 LAB — COMPREHENSIVE METABOLIC PANEL
Albumin: 3.1 g/dL — ABNORMAL LOW (ref 3.5–5.2)
Alkaline Phosphatase: 80 U/L (ref 39–117)
BUN: 20 mg/dL (ref 6–23)
Chloride: 102 mEq/L (ref 96–112)
Creatinine, Ser: 9.84 mg/dL — ABNORMAL HIGH (ref 0.4–1.5)
Glucose, Bld: 81 mg/dL (ref 70–99)
Total Bilirubin: 0.5 mg/dL (ref 0.3–1.2)
Total Protein: 7.7 g/dL (ref 6.0–8.3)

## 2010-08-03 LAB — CBC
Hemoglobin: 11.7 g/dL — ABNORMAL LOW (ref 13.0–17.0)
Platelets: 250 10*3/uL (ref 150–400)
RBC: 5.5 MIL/uL (ref 4.22–5.81)
WBC: 9.4 10*3/uL (ref 4.0–10.5)

## 2010-08-03 LAB — DIFFERENTIAL
Basophils Absolute: 0.1 10*3/uL (ref 0.0–0.1)
Eosinophils Absolute: 0.4 10*3/uL (ref 0.0–0.7)
Lymphocytes Relative: 21 % (ref 12–46)
Monocytes Relative: 6 % (ref 3–12)
Neutrophils Relative %: 68 % (ref 43–77)

## 2010-09-11 NOTE — Procedures (Signed)
CEPHALIC VEIN MAPPING   INDICATION:  Right upper extremity vein mapping, preop AVF.   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   EXAM:  The right cephalic vein is compressible.   Diameter measurements range from 0.31 cm to 0.52 cm.   The left cephalic vein is not evaluated.   See attached worksheet for all measurements.   IMPRESSION:  1. Patent right cephalic vein which is of acceptable diameter for use      as a dialysis access site.  2. Right basilic vein is patent with measurements as shown on attached      drawing.   ___________________________________________  Nelda Severe. Kellie Simmering, M.D.   AS/MEDQ  D:  07/18/2009  T:  07/19/2009  Job:  [id number]

## 2010-09-11 NOTE — Assessment & Plan Note (Signed)
OFFICE VISIT   [name], [name]  DOB:  [birth year]                                       07/18/2009  CHART#:[id number]   The patient returns today for vascular access evaluation in his right  extremity.  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  He also had a  Palindrome catheter inserted.  The left arm wound is healing.  He is on  antibiotics on dialysis.   On examination he has about a 1.5 to 2 cm area where the ulceration was  noted which is contracting and there is no drainage from this.  He has a  well-healed incision in the left arm.  Right upper extremity had a 3+  brachial and 2+ radial pulse.  Today I ordered vein mapping in the right  upper extremity which I reviewed and interpreted.  He does have adequate  cephalic vein in the right arm from the wrist to the shoulder and it  appears his basilic vein connects to the deep system in the mid upper  arm.   We will schedule him for a Cimino fistula of his right arm on Thursday,  March 31 at [facility] as an outpatient.  Hopefully this will provide  a satisfactory site for vascular access in this nice man.  I looked at  the forearm cephalic vein with the SonoSite today and it does appear to  be acceptable in caliber down to the level of the wrist.     Nelda Severe. Kellie Simmering, M.D.  Electronically Signed   JDL/MEDQ  D:  07/18/2009  T:  07/19/2009  Job:  [id number]

## 2010-09-11 NOTE — Assessment & Plan Note (Signed)
OFFICE VISIT   AVERIL, DOIDGE  DOB:  07-Aug-1940                                       05/07/2010  CHART#:[id number]   ADDENDUM:  Duplex ultrasound reveals significant change with  compression.  The second digit pressure in the right extremity is 38 at  rest.  With fistula compression it goes to 88.  Again, he is scheduled  for his fistula ligation this Friday.     Eldridge Abrahams, MD  Electronically Signed   VWB/MEDQ  D:  05/07/2010  T:  05/08/2010  Job:  [id number]

## 2010-09-11 NOTE — Consult Note (Signed)
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   HISTORY OF PRESENT ILLNESS:  The patient is a very pleasant 70 year old  gentleman who is in end-stage renal disease.  He has, in fact, been on  dialysis since 2002.  He currently dialyzes at [facility] in [place] on  Monday, Wednesdays and Fridays.  He had a fistula originally placed by  Dr. Scot Dock on June 19, 1999.  This has been his only fistula.  This  past weekend, he noted the fistula to be enlarging and becoming very  thin-walled.  This alarmed him and his physician recommended that he be  evaluated at [facility].   PAST MEDICAL HISTORY:  Consistent with hypertension, end-stage renal  disease, and a history of a stroke which is remote.   FAMILY HISTORY:  Noncontributory.   SOCIAL HISTORY:  He is single and retired.  He has a remote history of  tobacco use quitting, approximately 5 years ago.  He also has a remote  history of alcohol abuse.   REVIEW OF SYSTEMS:  A review of systems was undertaken.  All  interrogatories were answered negatively.   PHYSICAL FINDINGS:  A well-nourished, well-developed, elderly man in no  apparent distress.  Heart rate was 102, blood pressure 163/90, O2  saturation was 97%, respiratory rate was 16.  HEENT:  PERRLA.  EOMI.  Mucous membranes were pink and moist.  The trachea was midline.  Lungs  were clear to auscultation.  Cardiac exam revealed a regular rate and  rhythm.  A 2/6 systolic ejection murmur in the aortic window was  appreciated.  The abdomen was soft, nontender.  Bowel sounds were  present.  Musculoskeletal exam demonstrated no major deformities.  Neurological exam did reveal a very slight weakness in his right lower  extremity.  No  ulcers or rashes were present.  Lymphatic exam revealed  no lymphadenopathy.   Examination of his left AV fistula demonstrated 1 relatively large  aneurysmal area which was becoming very, very thin-walled.  At this  time, I requested Dr. Kellie Simmering to evaluate the patient.  He entered the  room and evaluated the fistula.  He felt that the fistula was in need of  ligation.  He so informed the patient and recommended ligation of the  fistula with insertion of a Palindrome hemodialysis catheter.  As the  patient dialyzes on Monday, Wednesday and Fridays, he recommended that  the procedure be done tomorrow with the patient being dialyzed on  Thursday if needed.   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  We do not advocate use of the fistula at this time for  hemodialysis.  We do feel that he should undergo ligation of the fistula  and placement of a Palindrome catheter.  This is scheduled to be done on  Wednesday, June 28, 2009.  After the fistula has had an opportunity to  cool down, we will reevaluate him for placement of other permanent  access.   Chad Cordial,  PA   Nelda Severe. Kellie Simmering, M.D.  Electronically Signed   KEL/MEDQ  D:  06/27/2009  T:  06/28/2009  Job:  [id number]   cc:   [facility]  Dr. Legrand Rams

## 2010-09-11 NOTE — Assessment & Plan Note (Signed)
OFFICE VISIT   [name], [name]  DOB:  Sep 18, 1940                                       08/24/2009  [id number]   Date of surgery was 07/27/2009.   The patient returns to clinic today following placement of a right  Cimino AV fistula on 07/27/2009.  This is the usual followup  appointment.  He does complain of some intermittent right hand numbness  and tingling.  He states that this only lasts less than 5 minutes at a  time and it usually occurs at night and without aggravating or  mitigating factors.  He states that when he is using his hand he does  not have this problem, the problem of numbness or tingling.  He does not  have any hand fatigue.   PHYSICAL FINDINGS:  Vital signs:  Heart rate is 94, blood pressure  210/121, temperature 97.9.  General:  He is a well-nourished African  American male in no distress.  HEENT:  NCAT.  PERRLA.  EOMI.  Mucous  membranes were pink and moist.  Lungs:  Were clear to auscultation.  Cardiac:  Exam revealed a regular rate and rhythm.  His incision was  well-healed.   He was instructed to continue to observe the tingling and numbness.  He  was informed that if this did increase or symptoms worsen or he  developed pain and hand fatigue with movement and work that he should  return to [place] for further evaluation.  His shunt appears to be maturing  very nicely and it should be ready for use in the next six to eight  weeks.   Joseph Cordial, PA   Joseph Oto. Fields, MD  Electronically Signed   KEL/MEDQ  D:  08/24/2009  T:  08/24/2009  Job:  [id number]

## 2010-09-11 NOTE — Assessment & Plan Note (Signed)
OFFICE VISIT   XYAIRE, GOLDNER  DOB:  09/08/1940                                       05/07/2010  CHART#:[id number]   [name] comes in today for evaluation of his right arm fistula.  The  patient had a right Cimino fistula placed by Dr. Kellie Simmering on July 27, 2009.  In his immediate postoperative visit, he was having intermittent  numbness and tingling, however, it was only lasting less than 5 minutes  at a time and went away with activity.  The patient states although he  has not been to see [place] in the office, he has been having problems since  his fistula went in.  It even caused him to miss deer hunting season  this year because he could not pull the trigger on his shotgun.  Over  the past 1 month, he has developed ulcers on the first and second  fingers.  He complains of some numbness and tingling and hand at all  times now.   PHYSICAL EXAMINATION:  Heart rate 120, blood pressure 119/72,  temperature is 98.1.  GENERAL:  Well-appearing, in no distress.  RESPIRATIONS:  Nonlabored.  CARDIOVASCULAR:  There is a palpable thrill within his fistula.  EXTREMITIES:  There are ulcerations of the first and second digits.  His  grip strength is adequate.   ASSESSMENT/PLAN:  Right finger ulcers on the same side as the right arm  fistula.  I suspect that his ulcerations and symptoms are related to a  steal syndrome.  I am going to order an ultrasound to confirm this.  In  the event that there is significant change in his pressures with and  without fistula compression, I am going to schedule him to get his  fistula ligated.  I discussed with him that this will hopefully improve  his a chance of healing his wounds.  I am not convinced that he is going  to get the sensation back in his hand as this has been present for quite  some time.  If ultrasound confirms a steal phenomenon, I am going to  remove his fistula this Friday the 13th.  We will coordinate his  dialysis  schedule so that he can be dialyzed appropriately.     Eldridge Abrahams, MD  Electronically Signed   VWB/MEDQ  D:  05/07/2010  T:  05/08/2010  Job:  [id number]

## 2010-09-13 ENCOUNTER — Inpatient Hospital Stay (HOSPITAL_COMMUNITY)
Admission: EM | Admit: 2010-09-13 | Discharge: 2010-09-18 | DRG: 417 | Disposition: A | Discharge status: Home / Self Care | Payer: Medicare Other | Source: Ambulatory Visit | Attending: General Surgery | Admitting: General Surgery

## 2010-09-13 ENCOUNTER — Emergency Department (HOSPITAL_COMMUNITY): Payer: Medicare Other

## 2010-09-13 DIAGNOSIS — E875 Hyperkalemia: Secondary | ICD-10-CM | POA: Diagnosis present

## 2010-09-13 DIAGNOSIS — Z992 Dependence on renal dialysis: Secondary | ICD-10-CM

## 2010-09-13 DIAGNOSIS — I120 Hypertensive chronic kidney disease with stage 5 chronic kidney disease or end stage renal disease: Secondary | ICD-10-CM | POA: Diagnosis present

## 2010-09-13 DIAGNOSIS — K8000 Calculus of gallbladder with acute cholecystitis without obstruction: Principal | ICD-10-CM | POA: Diagnosis present

## 2010-09-13 DIAGNOSIS — I739 Peripheral vascular disease, unspecified: Secondary | ICD-10-CM | POA: Diagnosis present

## 2010-09-13 DIAGNOSIS — N186 End stage renal disease: Secondary | ICD-10-CM | POA: Diagnosis present

## 2010-09-13 DIAGNOSIS — Z8673 Personal history of transient ischemic attack (TIA), and cerebral infarction without residual deficits: Secondary | ICD-10-CM

## 2010-09-13 LAB — CBC
HCT: 46.4 % (ref 39.0–52.0)
Hemoglobin: 14.6 g/dL (ref 13.0–17.0)
MCHC: 31.5 g/dL (ref 30.0–36.0)
RBC: 6.73 MIL/uL — ABNORMAL HIGH (ref 4.22–5.81)
WBC: 12.9 10*3/uL — ABNORMAL HIGH (ref 4.0–10.5)

## 2010-09-13 LAB — BASIC METABOLIC PANEL
CO2: 22 mEq/L (ref 19–32)
Calcium: 9.9 mg/dL (ref 8.4–10.5)
Chloride: 98 mEq/L (ref 96–112)
GFR calc Af Amer: 6 mL/min — ABNORMAL LOW (ref >60)
Glucose, Bld: 100 mg/dL — ABNORMAL HIGH (ref 70–99)
Potassium: 5.6 mEq/L — ABNORMAL HIGH (ref 3.5–5.1)
Sodium: 134 mEq/L — ABNORMAL LOW (ref 135–145)

## 2010-09-13 LAB — DIFFERENTIAL
Basophils Absolute: 0.1 10*3/uL (ref 0.0–0.1)
Eosinophils Relative: 4 % (ref 0–5)
Lymphocytes Relative: 28 % (ref 12–46)
Lymphs Abs: 3.6 10*3/uL (ref 0.7–4.0)
Monocytes Relative: 10 % (ref 3–12)
Neutrophils Relative %: 57 % (ref 43–77)

## 2010-09-13 LAB — HEPATIC FUNCTION PANEL
AST: 20 U/L (ref 0–37)
Albumin: 3.8 g/dL (ref 3.5–5.2)
Total Bilirubin: 0.2 mg/dL — ABNORMAL LOW (ref 0.3–1.2)
Total Protein: 9.4 g/dL — ABNORMAL HIGH (ref 6.0–8.3)

## 2010-09-14 ENCOUNTER — Inpatient Hospital Stay (HOSPITAL_COMMUNITY): Payer: Medicare Other

## 2010-09-14 LAB — BASIC METABOLIC PANEL
CO2: 21 mEq/L (ref 19–32)
Chloride: 98 mEq/L (ref 96–112)
Creatinine, Ser: 11.36 mg/dL — ABNORMAL HIGH (ref 0.4–1.5)
GFR calc Af Amer: 5 mL/min — ABNORMAL LOW (ref >60)
Glucose, Bld: 90 mg/dL (ref 70–99)

## 2010-09-14 LAB — DIFFERENTIAL
Band Neutrophils: 0 % (ref 0–10)
Basophils Relative: 1 % (ref 0–1)
Basophils Relative: 0 % (ref 0–1)
Eosinophils Absolute: 0.6 10*3/uL (ref 0.0–0.7)
Eosinophils Relative: 5 % (ref 0–5)
Lymphocytes Relative: 31 % (ref 12–46)
Lymphs Abs: 3.8 10*3/uL (ref 0.7–4.0)
Metamyelocytes Relative: 0 %
Monocytes Relative: 7 % (ref 3–12)
Myelocytes: 0 %
Neutro Abs: 7 10*3/uL (ref 1.7–7.7)

## 2010-09-14 LAB — HEPATIC FUNCTION PANEL
AST: 17 U/L (ref 0–37)
Albumin: 3.4 g/dL — ABNORMAL LOW (ref 3.5–5.2)
Alkaline Phosphatase: 125 U/L — ABNORMAL HIGH (ref 39–117)
Total Bilirubin: 0.4 mg/dL (ref 0.3–1.2)
Total Protein: 8.6 g/dL — ABNORMAL HIGH (ref 6.0–8.3)

## 2010-09-14 LAB — CBC
HCT: 43.4 % (ref 39.0–52.0)
HCT: 44.9 % (ref 39.0–52.0)
Hemoglobin: 13.7 g/dL (ref 13.0–17.0)
MCH: 21.7 pg — ABNORMAL LOW (ref 26.0–34.0)
MCH: 21.9 pg — ABNORMAL LOW (ref 26.0–34.0)
MCHC: 31.6 g/dL (ref 30.0–36.0)
MCHC: 32.1 g/dL (ref 30.0–36.0)
MCV: 68.3 fL — ABNORMAL LOW (ref 78.0–100.0)
RDW: 21.6 % — ABNORMAL HIGH (ref 11.5–15.5)
RDW: 22 % — ABNORMAL HIGH (ref 11.5–15.5)

## 2010-09-14 NOTE — Consult Note (Signed)
[name], [name]                             ACCOUNT NO.:  [id number]   MEDICAL RECORD NO.:  [id number]                   PATIENT TYPE:  INP   LOCATION:  IC03                                 FACILITY:  [facility]   PHYSICIAN:  [name], M.D.                DATE OF BIRTH:  Feb 01, 1941   DATE OF CONSULTATION:  12/13/2002  DATE OF DISCHARGE:                                   CONSULTATION   REFERRING PHYSICIANS:  Dr. Iona [name] and Dr. Garfield [name].   REASON FOR THE CONSULT:  This is a 70 year old gentleman with end-stage  renal disease on dialysis who presented with an upper GI bleed and dropped  his hematocrit down to 22 who has difficult-to-interpret symptoms due to his  mild uremic encephalopathy, but also has some evidence of discomfort in his  chest and has a mildly abnormal set of initial cardiac enzymes.  He is  difficult to interview but denies any chest discomfort.  Says he had some  sharp chest discomfort prior to admission but has none now.   PAST MEDICAL HISTORY:  Significant for end-stage renal disease.  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  Iron  deficiency anemia with heme positive stools in the past.  He has EGD and  colonoscopy done in March 2004 which showed diverticula and polyps in his  cecum which were resected.  He has hypertension.  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  He had an echocardiogram done in February  2001 which shows severe LVH with normal left ventricular function and  minimal valvular heart disease.   MEDICATIONS PRIOR TO ADMISSION:  1. Clonidine 0.2 mg b.i.d.  2. Norvasc 10 mg a day.  3. Metoprolol 100 mg a day.  4. PhosLo.  5. Lescol.  6. Requip.  7. Aspirin 325 mg a day.   SOCIAL HISTORY:  He lives in [place] with his daughter.   He is a retired  Engineer, manufacturing systems.  He has two children.  He smokes about a half a pack of  cigarettes a day and he quit about 10 years ago, does not drink any alcohol.  Has about a 40+ pack-year smoking history.  Does not use any illicit drugs.   FAMILY HISTORY:  His mother died during childbirth and his father died at  the age of 11 of a myocardial infarction.   CURRENT MEDICATIONS:  In the hospital he is on:  1. Unasyn 1.5 grams once a day.  2. Protonix 40 mg a day.  3. Normal saline.  4. Ativan, Demerol, Phenergan, and Benadryl - all p.r.n.   REVIEW OF SYSTEMS:  Unobtainable.  PHYSICAL EXAMINATION:  GENERAL:  He is an ill-appearing man with an NG tube  that is draining bright red blood.  VITAL SIGNS:  His temperature is 97.8, his pulse is 112 in sinus  tachycardia, respiratory rate 20, his blood pressure is 92/59, and 97% on 2  liters nasal cannula.  He is lying in bed uncomfortable in the left lateral  decubitus position and difficult to examine or interview.  HEENT:  Significant only for the NG tube and the poor dentition.  NECK:  Supple.  He has no jugular venous distention.  HEART:  He does have a 2/6 holosystolic murmur that radiates up into his  neck.  CHEST:  Clear to auscultation bilaterally.  ABDOMEN:  Mildly tender in the upper quadrants but otherwise is normal with  hyperactive bowel sounds.  EXTREMITIES:  Without significant clubbing, cyanosis, or edema.  NEUROLOGIC:  Difficult to interpret.  He is, however, not alert and oriented  at this time.   LABORATORY DATA:  He had a chest CT which showed no acute changes but the  liver lesion that was previously described.  His electrocardiogram shows  sinus tachycardia at a rate of 126 with normal axes, normal intervals, no ST-  T wave changes concerning for ischemia, and no Q waves; essentially  unchanged from an EKG from January 2004.  Laboratories:  His white blood  cell count is 22,000 with an H&H of 7 and 22;  platelet count of 296,000.  Sodium of 138, potassium 4.8, chloride of 100, bicarb of 22, BUN and  creatinine are 111 and 14.9 with a blood sugar of 109.  His liver function  studies are unremarkable.  Initial cardiac enzymes show a CK of 254 with an  MB of 7.0 and a troponin of 0.08.   ASSESSMENT AND PLAN:  In essence, this is a gentleman with unusual  discomfort in his chest which is atypical for coronary artery disease.  His  electrocardiogram is essentially unchanged but he has a mildly abnormal set  of cardiac enzymes on initial.  This may be secondary to either his acute GI  bleed or potentially even his renal failure.  He does have an acute upper GI  bleed which needs to be addressed promptly.  The end-stage renal disease I  think he is undergoing hemodialysis for this.  The confusion is not clear to  me as to what that is from, and his leukocytosis is also unclear.  At this  point if he does have coronary artery disease there is not a lot that we can  do to treat that other than to give him blood and potentially he may benefit  from some beta blockers but at this point with his hemodynamic instability I  am not certain that is a good idea.  I really would encourage initially for  him to be resuscitated with blood and fluids to stabilize his hemodynamics,  get a sense for where he is bleeding and treat that primarily, and at that  point I think aggressive evaluation for his coronary artery disease is  probably warranted, but until he can be safely anticoagulated, evaluation  invasively does not seem to be warranted at this point.                                               [name], M.D.  JH/MEDQ  D:  12/13/2002  T:  12/13/2002  Job:  [id number]

## 2010-09-14 NOTE — Consult Note (Signed)
NAMEFREDRIK, [name]                               ACCOUNT NO.:  [id number]   MEDICAL RECORD NO.:  [id number]                   PATIENT TYPE:   LOCATION:                                       FACILITY:   PHYSICIAN:  Barrie Folk. Berdine Addison, M.D.                DATE OF BIRTH:   DATE OF CONSULTATION:  DATE OF DISCHARGE:                                   CONSULTATION   CHIEF COMPLAINT:  Passing black stools around 1730 on the day of admission.   HISTORY OF PRESENT ILLNESS:  The patient is a 70 year old single disabled  black male from [place], [place].  History is positive for stomach  pain, increased bowel sounds, nausea, vomiting, dizziness and passing of  several black stools.  The patient experienced also, malaise and sweating  episodes.  The patient contacted his daughter for transportation to the  emergency room at [facility] for evaluation.  He vomited in the  emergency room at [facility] was described as coffee-  ground color and guaiac positive.  Stools examined by ER staff, revealed  black discoloration.   PAST MEDICAL HISTORY:  Negative for peptic ulcer disease, colon cancer,  inflammatory bowel disease and bleeding disorder.  Medical history positive  for diverticular disease of the sigmoid colon by colonoscopy, end-stage  renal disease and hypertension.  Medical history was negative also for  diabetes, tuberculosis, sickle cell, asthma, seizures disorder.   HABITS:  Negative for heavy consumption of nonsteroidal anti-inflammatory  agent, positive former use of ethanol x10 years and positive recurrent  cigarette smoking (1-1/2 pack per day since age 56).   PRESCRIBED MEDICATIONS:  1. Clonidine 0.2 mg p.o. b.i.d.  2. Norvasc 10 mg p.o. every day.  3. Toprol XL 100 mg p.o. every day.  4. One aspirin 325 mg, coated, p.o. every day.   ALLERGIES:  Not allergic to any known medication.   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  The patient had an AV  shunt inserted during this hospitalization.  Patient goes to dialysis on  Monday, Wednesday and Friday of each week, under the supervision of Dr.  Randel [name].   FAMILY HISTORY:  Mother deceased at age 55, cause unknown; father deceased  age 47, secondary to heart attack; one daughter living, age 60, with history  of hypertension and one son living, age 80, good health.   REVIEW OF SYSTEMS:  Negative for syncope, shortness of breath, chronic  hiccup, chest pain, palpitation, hemoptysis, wheezing, epistaxis, dysphagia,  constipation, diarrhea, swelling of legs, weight loss, night sweats, etc.  Sexually transmitted disease history is positive for gonorrhea  infection x2  (treated by a local physician).  Sexually transmitted disease history is  negative for syphilis, herpes and HIV infection.   PHYSICAL EXAMINATION:  GENERAL:  A middle-aged, medium framed, medium  height, alert black male in no apparent respiratory distress.  SKIN:  Warm and dry.  HEENT:  Head-Normocephalic.  Eyes-negative ptosis, sclerae white.  Pupils  round, equal and reactive to light.  Extraocular movement intact.  Ears-  normal auricle, external canals patent, tympanic membranes pearly-gray.  Nose-negative for discharge.  Mouth-positive missing teeth, dentition fair  with a few cavities, no bleeding gums.  Posterior pharynx benign.  VITAL SIGNS:  Blood pressure at 2130 on December 12, 2002, pulse 90,  respirations 32, blood pressure 107/59.  Blood pressure at 2201 on December 12, 2002, pulse 101, blood pressure 71/44.  Blood pressure at 2235 on December 12, 2002, pulse 93, blood pressure 88/52.  NECK:  Negative for lymphadenopathy or thyromegaly.  LUNGS:  Clear.  HEART:  Audible S1, S2 without murmur, rub or gallop.  Regular rate and  rhythm.  ABDOMEN:  Slightly obese, hyperactive bowel sounds, soft, nontender in all  four  quadrants, no palpable mass, no organomegaly.  GENITALIA:  Penis uncircumcised, no penile lesion or discharge.  Scrotum-  palpable testicles without nodular tenderness.  RECTAL:  Exam by ER, stools black.  EXTREMITIES:  Upper left arm positive for AV shunt with palpable thrill.  Lower extremities, no tibial edema, no discoloration.  Palpable femoral  artery and dorsalis pedis bilaterally.  NEUROLOGIC:  Alert and oriented to person, place and time.  Cranial nerves  II-XII appeared intact.  Babinksi downward bilaterally.   LABORATORY DATA:  White count 19.7, hemoglobin 10.4, hematocrit 32.5,  platelet 316,000.  Sodium 134, potassium 4.7, chloride 96, CO2 28, glucose  195, BUN 93, creatinine 15.0.  Albumin 3.2, SGOT 25, SGPT 21, alkaline  phosphatase 47.  Total bilirubin 0.4.  Prothrombin time 13.6 seconds.  INR  1.1, partial thromboplastin time 31 seconds.   IMPRESSIONS:  Primary  1. Acute upper gastrointestinal bleed.  2. Mild anemia.  Secondary Diagnoses  1. End-stage renal disease.  2. Hypertension.   DISCUSSION:  The patient has been evaluated by Dr. Gala [name] at my request  because of his hemodynamic instability.  Moreover, Dr. Caron [name],  Nephrologist, will be contacted to supervise his dialysis which is due on  December 13, 2002.  The patient has been given Pepcid 20 mg IV x1 in the  emergency room.  Moreover, he has been started on IV normal saline for a  bolus of 250 cc.  The patient is admitted to ICU.  He is scheduled for  transfusion of 1 unit of  packed red blood cells (type and cross) as ordered by GI.  Continue IV fluid  of normal saline.  NG tube has been inserted for low intermittent suction.  Activity-Bedrest.  Will repeat CBC, anemia panel and reticulocyte count.  Hold all antihypertensive medications.  Diagnostic EGD by [name],  M.D.                                                   Barrie Folk. Berdine Addison, M.D.   Armanda Heritage  D:  12/13/2002  T:  12/13/2002  Job:   [id number]

## 2010-09-14 NOTE — Op Note (Signed)
[place]  Patient:    [name], [name]                            MRN: [id number] Proc. Date: 12/26/99 Attending:  Gordy Clement, M.D.                           Operative Report  PREOPERATIVE DIAGNOSIS: 1. End-stage renal failure. 2. Poorly functioning right internal jugular Ash catheter.  POSTOPERATIVE DIAGNOSIS: 1. End-stage renal failure. 2. Poorly functioning right internal jugular Ash catheter.  OPERATION PERFORMED:  Reinsertion of right internal jugular Ash catheter.  SURGEON:  Gordy Clement, M.D.  ASSISTANT:  Nurse.  ANESTHESIA:  Local with MAC.  DESCRIPTION OF PROCEDURE:   The patient was brought to the operating room in stable condition.  Placed in supine position.  Right neck and chest prepped and draped in a sterile fashion.  Skin and subcutaneous tissues were instilled with 1% Xylocaine with epinephrine.  A transverse skin incision was made at the base of the right neck.  Dissection carried down to expose the Ash catheter which was tunneled.  The catheter mobilized.  Catheter controlled with a hemostat and divided transversely.  A guide wire passed through the catheter and the distal portion of the catheter removed.  The remaining portion of the catheter was also removed.  A new subcutaneous tunnel was then created.  A new Ash catheter was placed through the tunnel.  A tear-away sheath and dilator were inserted over the guide wire and the dilator and guide wire were removed.  The catheter placed through the tear-away sheath and the sheath removed.  The catheter positioned in the superior vena cava under fluoroscopic control.  The skin was closed at the insertion site with interrupted 3-0 nylon suture. Catheter fixed to the skin with interrupted 2-0 silks or interrupted 3-0 silk suture.  Catheter filled with heparin saline solution.  A sterile dressing was applied.  The patient was transferred to the recovery room in  stable conditionn. DD: 12/26/99 TD:  12/27/99 Job: [id number]

## 2010-09-14 NOTE — Op Note (Signed)
NAME:  MATHHEW, DOODY                             ACCOUNT NO.:  [id number]   MEDICAL RECORD NO.:  [id number]                   PATIENT TYPE:  AMB   LOCATION:  DAY                                  FACILITY:  [facility]   PHYSICIAN:  R. Garfield Cornea, M.D.              DATE OF BIRTH:  11-08-1940   DATE OF PROCEDURE:  DATE OF DISCHARGE:                                 OPERATIVE REPORT   PROCEDURE:  Surveillance esophagogastroduodenoscopy.   ENDOSCOPIST:  Cristopher Estimable. Rourk, M.D.   INDICATIONS FOR PROCEDURE:  The patient is a 70 year old gentleman admitted  to the hospital back in August this year with an upper GI bleed.  EGD  revealed multiple gastric ulcers in geographic distribution and erosive  reflux esophagitis.  He had H. pylori based on serologies.  He was treated  with a 2-week course of triple drug therapy.  He has done really well.  He  has been on Nexium 40 mg orally daily ever since.  He really has not had any  GI symptoms until actually today when he has experienced some nausea this  morning.  EGD is now being done to document healing of the previously  documented gastric ulceration.  This approach has been discussed with the  patient at length.  The potential risks, benefits, and alternatives have  been reviewed.  Please see my office dictation of February 01, 2003 and my  handwritten, interim and updated H&P.   PROCEDURE NOTE:  O2 saturation, blood pressure, pulse and respirations were  monitored throughout the entirety procedure.  Conscious sedation: Versed 3  mg IV in divided doses, morphine 2 mg IV.   INSTRUMENT:  Olympus video chip adult gastroscope.   FINDINGS:  Examination of the tubular esophagus revealed no mucosal  abnormalities. The EG junction was easily traversed.   STOMACH:  The gastric cavity was empty.  It insufflated well with air.  A  thorough examination of the gastric mucosa including a retroflex view of the  proximal stomach and esophagogastric junction  demonstrated only a small  hiatal hernia.  Previously noted gastric ulcers were completely healed and  the mucosa appeared normal.  The pylorus was patent and easily traversed.   DUODENUM:  The bulb and the second portion revealed no abnormalities.   THERAPEUTIC/DIAGNOSTIC MANEUVERS:  None.   The patient tolerated the procedure well and was reacted in endoscopy.   IMPRESSION:  1. Normal esophagus.  2. Small hiatal hernia otherwise normal stomach.  3. Normal D1 and D2.   RECOMMENDATIONS:  1. Continue Nexium 40 mg orally daily for gastroesophageal reflux disease.  2. A risks and benefits analysis needs to be done in regards to the     patient's starting back on antiplatelet therapy in the way of a baby     aspirin, once daily.  Certainly if it is elected to put him back on a  baby aspirin daily he needs to remain on concomitant acid suppression     therapy in the way of Nexium or some other PPI.      ___________________________________________                                            Bridgette Habermann, M.D.   RMR/MEDQ  D:  04/05/2003  T:  04/05/2003  Job:  [id number]   cc:   Scarlett Presto, M.D.  Fax: [phone]   Alison Murray, M.D.  [address]  [place]  [place] [address]  Fax: [place]. Berdine Addison, M.D.  [address]  [place]  [place] [address]  Fax: [phone]

## 2010-09-14 NOTE — Procedures (Signed)
   NAME:  TROYE, YAMASHIRO                             ACCOUNT NO.:  [id number]   MEDICAL RECORD NO.:  [id number]                   PATIENT TYPE:  EMS   LOCATION:  ED                                   FACILITY:  [facility]   PHYSICIAN:  Edward L. Luan Pulling, M.D.             DATE OF BIRTH:  [birth year]   DATE OF PROCEDURE:  06/25/2002  DATE OF DISCHARGE:  06/25/2002                                EKG INTERPRETATION   TIME/DATE:  [id number], June 25, 2002.   DESCRIPTION OF PROCEDURE:  The rhythm is a sinus tachycardia with a rate of  about 110.  The axis is leftward but does not meet criteria for left axis  deviation.  Somewhat slow R-wave progression across the precordium may  indicate a previous anteroseptal myocardial infarction.  There are ST and T-  wave changes which are diffuse but nonspecific.                                               Edward L. Luan Pulling, M.D.    ELH/MEDQ  D:  06/25/2002  T:  06/25/2002  Job:  [id number]

## 2010-09-14 NOTE — Op Note (Signed)
NAME:  [name], [name]                             ACCOUNT NO.:  [id number]   MEDICAL RECORD NO.:  [id number]                   PATIENT TYPE:  INP   LOCATION:  IC03                                 FACILITY:  [facility]   PHYSICIAN:  R. Garfield Cornea, M.D.              DATE OF BIRTH:  July 04, 1940   DATE OF PROCEDURE:  12/14/2002  DATE OF DISCHARGE:                                 OPERATIVE REPORT   PROCEDURE:  Bedside ICU diagnostic esophagogastroduodenoscopy.   ENDOSCOPIST:  Cristopher Estimable. Rourk, M.D.   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  He was admitted to the hospital with a  hemodynamically significant upper GI bleed.  He has stabilized over the past  12 hours.  Bleeding has subsided.  He had chest pain, elevated cardiac  enzymes and EKG abnormalities for which we held off on EGD yesterday and  proceeded with consultation.  It was not felt that he was having an acute  ishemic event per Dr. Wilhemina Cash.   He has received a total of 7 units of packed red blood cells since  admission.  As of this morning, he is now hemodynamically stable. He is  having no pain.   EGD is now being done at the bedside.  This approach has been discussed with  the patient at length.  The potential risks, benefits, and alternatives have  been reviewed; questions answered; all parties are agreeable. Please see the  documentation in the medical record.   PROCEDURE NOTE:  O2 saturation, blood pressure, pulse and respirations were  monitored throughout the entirety of the procedure.  Conscious sedation:  Morphine sulfate 2 mg IV, Versed 2 mg IV. Cetacaine spray for topical  oropharyngeal anesthesia.   INSTRUMENT:  Olympus video chip adult gastroscope.   FINDINGS:  Examination of the tubular esophagus revealed a single distal  esophageal erosion, otherwise the esophageal mucosa appeared normal.  The EG  junction was easily traversed.   STOMACH:  The gastric cavity was free of blood.  It insufflated well with  air.  A thorough examination of the gastric mucosa including a retroflex  view of the proximal stomach and esophagogastric junction was undertaken.  The patient had multiple punctate hemorrhages in the area of the antrum  consistent with NG tube trauma.   He also had multiple gastric ulcers in a geographic distribution  throughout the body and greater curvature. Although these lesions were  friable there was no stigmata of arterial bleeding seen.  This did not  appear to represent an infiltrating process.  Please see photos.  There were  also multiple antral erosions.  The pylorus was patent and easily traversed.   Examination of the bulb and the second portion revealed multiple bulbar  erosions; however, no duodenal ulcers were seen.  The second portion  appeared normal.  There were no therapeutic modalities employed.  The patient tolerated the  procedure well and was reacted at the bedside.   IMPRESSION:  1. Distal esophageal erosions consistent with erosive reflux esophagitis     (mild).  2. Multiple gastric ulcers in a geographic distribution.  3. Multiple antral and bulbar erosions.  4. Punctate hemorrhages in the antrum consistent with nasogastric tube     trauma. The remainder of the upper gastrointestinal tract was okay.  5. I suspect today's findings may be secondary to aspirin, Helicobacter     pylori needs to be ruled out.  Mucosal ischemia also remains in the     differential.  6. I doubt this represents a neoplastic process, but he will certainly need     to have follow up interval endoscopies to document healing.   RECOMMENDATIONS:  1. Change Protonix to p.o. Increase Protonix to 30 mg orally b.i.d.  2. Add Carafate 2 gm slurries q.i.d. x3 days.  3. Check H. pylori serologies.  4. Allow a clear liquid diet.  5. Recheck hemoglobin later today and tomorrow morning.  6. Avoid aspirin in the  future.  7. Plan for a repeat EGD in approximately 12 weeks.  I have discussed by     findings and recommendations with the patient's son in the ICU.                                               Bridgette Habermann, M.D.    RMR/MEDQ  D:  12/14/2002  T:  12/14/2002  Job:  [id number]   cc:   Barrie Folk. Berdine Addison, M.D.  [address]  [place]  [place] [address]  Fax: [phone]   Scarlett Presto, M.D.  Fax: [phone]   Alison Murray, M.D.  [address]  [place]  [place] [address]  Fax: [phone]

## 2010-09-14 NOTE — Op Note (Signed)
[place]  Patient:    [name], [name]                            MRN: [id number] Proc. Date: 12/11/99 Adm. Date:  [id number] Disc. Date: [id number] Attending:  Dorothea Glassman                           Operative Report  PREOPERATIVE DIAGNOSIS:  Chronic renal failure.  POSTOPERATIVE DIAGNOSIS:  Chronic renal failure.  PROCEDURE:  Placement of right IJ Ash catheter.  SURGEON:  Judeth Cornfield. Scot Dock, M.D.  ASSISTANT:  Nurse.  ANESTHESIA:  Local with sedation.  DESCRIPTION OF PROCEDURE:  The patient was taken to the operating room and sedated by anesthesia.  The neck and upper chest were prepped and draped in the usual fashion.  After the skin was infiltrated with 1% lidocaine, the right internal jugular vein was cannulated and the guide wire was introduced into the superior vena cava under fluoroscopic control.  The exit site for the catheter was chosen and the skin was anesthetized between the two areas.  A 24.0 cm catheter was passed between the two incisions with the cuff positioned in the midportion of the tunnel.  The tract over the wire was dilated and then the dilator and peel-away sheath were passed over the wire, and then the wire and dilator were removed.  The catheter was passed through the peel-away sheath and positioned in the right atrium.  Both ports went through easily and were then flushed with heparinized saline and filled with concentrated heparin.  The catheter was secured at its exit site with a #3-0 nylon suture. The IJ cannulation site was closed with #4-0 subcuticular stitch.  Sterile dressing was applied.  The patient tolerated the procedure well and was transferred to the recovery room in satisfactory condition.  All needle and sponge counts were correct.DD: 12/11/99 TD:  12/12/99 Job: [id number]

## 2010-09-14 NOTE — Discharge Summary (Signed)
NAMEKAVIK, YERTON                             ACCOUNT NO.:  [id number]   MEDICAL RECORD NO.:  [id number]                   PATIENT TYPE:  INP   LOCATION:  A201                                 FACILITY:  [facility]   PHYSICIAN:  Hildred Laser, M.D.                 DATE OF BIRTH:  [birth year]   DATE OF ADMISSION:  12/12/2002  DATE OF DISCHARGE:  12/16/2002                                 DISCHARGE SUMMARY   ADMISSION DIAGNOSES:  1. Gastrointestinal bleed with melena and hematemesis.  2. End-stage renal disease on dialysis.  3. History of hypertension.   DISCHARGE DIAGNOSES:  1. Upper gastrointestinal bleed with multiple gastric ulcers in a geographic     distribution, multiple antral and bulbar erosions, distal esophageal     erosions consistent with erosive reflux esophagitis.  2. Anemia secondary to #1.  3. Helicobacter pylori positive serologies.  4. End-stage renal disease.  5. Hypertension.   CONSULTATIONS:  1. Scarlett Presto, M.D., cardiology.  Consult obtained on December 13, 2002.  2. Dr. Iona Beard assisted with the patient's care, on call for primary     care physician, Dr. Irving Shows.  3. Alison Murray, M.D., for dialysis on December 13, 2002.   PROCEDURES:  1. Esophagogastroduodenoscopy on December 14, 2002, by Dr. Gala Romney which     revealed above findings as well as punctate hemorrhages in the antrum     consistent with nasogastric tube trauma.  2. Computed tomography of the chest, abdomen and pelvis on December 13, 2002,     performed for severe abdominal pain and chest pain revealing     arthrosclerosis of the thoracic aorta, but no evidence of dissection or     aneurysm.  Nasogastric tube in place.  Calcified pleural plaque in left     hemidiaphragm.  Stomach moderately distended with ingested material and     air which may reflect blood clot.  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  Mildly prominent lymph nodes in the gastrohepatic ligament.   HISTORY OF PRESENT ILLNESS:  The patient is a 70 year old, African-American  male with end-stage renal disease on dialysis.  He came to the emergency  room after experiencing several black tarry bowel movements.  While in the  ED, he became nauseated and had coffee-grounds emesis.  Just prior to  vomiting, he had periumbilical abdominal cramping.  In the ED, he was found  to be orthostatic with systolic pressure of 70 standing and sitting pressure  was 95 and pulse was in the 90-100 range.  NG tube was placed and returned  relatively small amount of coffee-grounds emesis and blood  clots.  He was  admitted for further management and evaluation.   LABORATORY DATA AND X-RAY FINDINGS:  On admission, the patient's WBC was  19,700, hemoglobin 10.4, hematocrit 32.5.  PT/INR normal, PTT normal.  Glucose elevated at 195, BUN 93, creatinine 15, potassium 4.7, albumin 3.2.  LFTs otherwise normal.   HOSPITAL COURSE:  Problem 1.  GASTROINTESTINAL BLEED:  Initially, the  patient was started on IV Protonix 40 mg q.24h.  He was given one unit of  packed red blood cells initially.  He was given gentle rehydration given  history of end-stage renal disease.  Overnight, he had several bloody,  maroon stools.  By the next morning, his hemoglobin was 9.2, hematocrit  28.4, white count up to 22.5.  BUN was up to 111 with creatinine 14.9.  Amylase and lipase were normal.  He was having severe abdominal pain.  He  developed chest pain.  Given his leukocytosis and severe abdominal pain, CT  of the abdomen, chest and pelvis was obtained with findings as outlined  above.  Cardiology consult was obtained for abnormal EKG and chest pain.  Cardiac enzymes x3 were mildly elevated, but it was felt to be due to his   renal failure.  His chest pain was felt to be atypical, although he does  have multiple risks factors for coronary artery disease, therefore  outpatient workup was recommended.  From a cardiology standpoint, he was  felt to be stable for endoscopy.  On hospital day #2, he had a diagnostic  bedside EGD as outlined above.  By this point, he had already received seven  units of packed red blood cells.  His Protonix was increased to b.i.d.  Carafate was added.  H. pylori serologies were obtained and were positive.  He is advised to use no more NSAIDs or aspirin.  By day #3 of  hospitalization, he was feeling much better.  He began diet and was  tolerating this very well.  He was transferred out to unit from the ICU.  He  was still passing some dark, maroon stools, therefore he was kept for an  extra day.  By the time of discharge, his hemoglobin was stable for more  than 24 hours and his stools were becoming more light.  He was felt to be  stable for discharge.  Not mentioned previously, his hemoglobin declined  down to 7.7 throughout this hospitalization.  By the time of discharge, his  hemoglobin was 10.6 post seven units of packed red blood cells.   Problem 2.  END-STAGE RENAL DISEASE:  Dr. Lowanda Foster was consulted for  dialysis.  The patient has end-stage renal disease on dialysis.  On  admission, his creatinine was 15.  The patient was hemodialyzed twice during  admission.  At the time of discharge, his creatinine was 12.8.  He received  six of his seven units of packed red blood cells while on dialysis.   Problem 3.  CHEST PAIN:  Cardiology consult was obtained.  He had sinus  tachycardia with nonspecific ST abnormalities.  Three sets of cardiac  enzymes were slightly elevated felt to be due to his renal failure.  The  patient was felt to have multiple risk factors and is scheduled for outpatient followup with Dr. Wilhemina Cash in one to two weeks.  Plans are for  possible adenosine Cardiolite.   The patient's chest pain resolved in less  than 24 hours of when it began.   Problem 4.  HISTORY OF LIVER LESION:  This was again seen on CT and was felt  to be reduced in size from prior MRI in 1999.  He will follow up with Dr.  Irving Shows.   DISCHARGE LABORATORY DATA AND X-RAY FINDINGS:  On December 16, 2002,  hemoglobin was 10.6, hematocrit 31.2.  Sodium was 134, potassium 4.6, BUN  62, creatinine 12.8.  White count did come down to 12.8 as of December 15, 2002.   DISCHARGE PHYSICAL EXAMINATION:  VITAL SIGNS:  Temperature 98.1, pulse 78,  respirations 18, blood pressure 148/85.  ABDOMEN:  Soft, nontender, nondistended.  CHEST:  Lungs clear to auscultation.   CONDITION ON DISCHARGE:  Good.   DISPOSITION:  Discharged to home.    DISCHARGE MEDICATIONS:  1. Resume home medications.  He will not take any aspirin or NSAIDs.  2. Protonix 40 mg one p.o. b.i.d. x2 weeks and then once daily, prescription     given with same instructions with one-month supply and three refills.  3. Amoxicillin 500 mg two tablets p.o. b.i.d. x14 days, no refills.  4. Clarithromycin 500 mg one p.o. b.i.d. x14 days, no refills.   FOLLOW UP:  The patient was instructed to follow up with Dr. Ellyn Hack on  September 2, at 11:30 a.m.  Follow up with Dr. Irving Shows on September 2,  at 9 a.m.  Our office will arrange for repeat upper endoscopy in 10 weeks by  Dr. Gala Romney.   SPECIAL INSTRUCTIONS:  He is to notify M.D. if he develops black stools,  blood in stools, fever, abdominal or chest pain and/or weakness.     Neil Crouch, P.A.                        Hildred Laser, M.D.    LL/MEDQ  D:  12/16/2002  T:  12/17/2002  Job:  [id number]

## 2010-09-14 NOTE — H&P (Signed)
NAME:  [name]                             ACCOUNT NO.:  [id number]   MEDICAL RECORD NO.:  [id number]                   PATIENT TYPE:  INP   LOCATION:  IC03                                 FACILITY:  [facility]   PHYSICIAN:  R. Garfield Cornea, M.D.              DATE OF BIRTH:  May 13, 1940   DATE OF ADMISSION:  12/12/2002  DATE OF DISCHARGE:                                HISTORY & PHYSICAL   REASON FOR ADMISSION:  Upper gastrointestinal bleed.   HISTORY OF PRESENT ILLNESS:  [name] is a pleasant 70 year old African  American male with end-stage renal disease, on hemodialysis.  He came to the  emergency department today after experiencing several black tarry bowel  movements.  While waiting in the emergency department, he became nauseated  and had some coffee-ground emesis.  Just prior to the vomiting, he had some  periumbilical abdominal cramping.  He was brought to the back of the  emergency department, where he was found to be orthostatic with a systolic  pressure of 70 standing and sitting pressure was 95.  Pulse was in the 90-  100 range.  NG tube was placed which has returned relatively small amount of  coffee-grounds and blood clot.   He tells me he had some vague abdominal cramping two days ago, but this is  the first time he has ever had black tarry bowel movements or hematemesis.  He currently denies any abdominal pain.  He has a systolic blood pressure of  95 and a pulse of 90.   ADMISSION LABORATORY DATA:  White count 19,700, H&H 10.4 and 32.5.  MCV  66.3, pro time 13.6, platelet count 316,000.   PAST MEDICAL HISTORY:  Significant for iron deficiency anemia, Hemoccult  positive stools noted earlier this year for which he underwent EGD and  colonoscopy by [name], M.D.  Old records indicate he underwent EGD  back in March of this year.  It was a negative exam.  Colonoscopy revealed  left-sided diverticula and a pedunculated polyp in the cecum which was  resected (pathology not on chart).  Again, prior to this acute illness, Mr.  [name] denies reflux symptoms, odynophagia, dysphagia, early satiety, nausea,  vomiting.  Has not had any melena or rectal bleeding previously, nor has he  had any abdominal pain.  No history of peptic ulcer disease.  He denies any  NSAID use, aside from the one aspirin that he is taking.  There is no family history of GI malignancy.  He gets dialysis three days  weekly and today he is followed by Dr. [name].  Drs. Hill and Warrington  primarily.   Past medical history is significant for long-standing hypertension, end-  stage renal disease, probably secondary to hypertension, on hemodialysis.  History of iron deficiency anemia with workup as outlined above.  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   [name] denies any cardiopulmonary problems previously, although old  records indicate he did have plain films previously, suggesting  cardiomyopathy and congestive heart failure.  [name], M.D. also was  noted to have ordered an MRI previously, which revealed a suspicious lesion  in the right lobe of the liver, impinging on his kidney (followup indicated  in the chart, however, the patient says he was rechecked and this lesion  improved on subsequent studies without being fully explained per patient).   PAST SURGICAL HISTORY:  Left AV shunt, left upper extremity surgery in  [place] and right shoulder at age 48.   ADMISSION MEDICATIONS:  1. Clonidine HCL 0.2 mg b.i.d.  2. Norvasc 10 mg daily.  3. Metoprolol 100 mg daily.  4. PhosLo.  5. Lescol XL.  6. Requip.  7. ASA 325 mg daily.   ALLERGIES:  No known drug allergies.   FAMILY HISTORY:  No current GI disease.   SOCIAL HISTORY:  Patient retired from a Clinical cytogeneticist.  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  He does smoke.  He does not use alcohol.  He  has two children.  One of his daughters,  Ms. [name], is with him  this evening.   REVIEW OF SYSTEMS:  No recent chest pain, dyspnea on exertion.  No change in  weight.  No fever or chills.   PHYSICAL EXAMINATION:  GENERAL:  A pleasant 70 year old African American  male, sitting up on a hospital gerney with an NG tube in place.  He is  alert, conversant, appears comfortable.  He is accompanied by his daughter.  VITAL SIGNS:  Blood pressure 95/70, pulse 90, respiratory rate 18.  SKIN:  Warm and dry.  HEENT:  Sclerae muddy, conjunctivae somewhat pale.  Oral cavity, no lesions.  Dentition somewhat poor.  NECK:  JVD is not prominent.  CHEST:  Lungs are clear to auscultation.  CARDIAC:  Regular rate and rhythm without murmur, gallop, rub.  ABDOMEN:  Nondistended.  He has no scars.  Positive bowel sounds, no bruits.  Abdomen is entirely soft and nontender, without appreciable mass or  organomegaly.  EXTREMITIES:  No edema.   ADDITIONAL LABORATORY DATA:  Sodium 134, potassium 12.7, chloride 96, carbon  dioxide 20, glucose 195, BUN 93, creatinine 15.  Calcium 8.5, total protein  6.7, albumin 3.2, AST 25, ALT 21, alkaline phosphatase 647, bilirubin 0.4.  White count 19.7, H&H 10.4, 32.5.  MCV 66.3 as stated above.  Neutrophils  56%.   ADMITTING IMPRESSION:  [name] is a pleasant 70 year old gentleman  admitted to the hospital with a hemodynamically significant gastrointestinal  bleed.  He has stabilized in terms of blood pressure with infusion of normal  saline.  He has been given a dose of Prevacid by Dr. Araceli Bouche here in the  emergency department.   He has had some recent abdominal discomfort as described above, which may or  may not have anything to do with his gastrointestinal bleeding.  He does  take aspirin daily.   I doubt if this gentleman has advanced chronic liver disease, peptic ulcer  disease is fairly out of the differential at this time.  The relationship between bleeding and vomiting is not consistent  for a Mallory-Weiss tear,  but at this point, that remains a possibility as well.   He does have end-stage renal disease and is on dialysis, scheduled to have  dialysis  tomorrow morning.  That will likely have to be postponed.   PLAN:  1. I discussed the patient briefly with Dr. Berdine Addison earlier this evening.  Will     go ahead and admit him to my service for placement in the ICU.  Will     start IV Protonix 40 mg IV for 24 hours.  Will back off his normal saline     a bit to run 75 cc/hr. and watch him very closely in terms of hemodynamic     parameters.  Will go ahead and transfuse 1 unit of packed red blood cells     initially and keep typed and crossed at all times.  2. Recheck CBC tomorrow morning.  3. Will plan to perform EGD tomorrow morning.  I have discussed this     approach along with potential benefits, alternatives with Ms. [name], the patient's daughter and Mr. Dibartolo.  All parties are     agreeable.  4. Will also ask Dr. [name] to come see him tomorrow.                                                    Bridgette Habermann, M.D.    RMR/MEDQ  D:  12/12/2002  T:  12/13/2002  Job:  [id number]   cc:   Barrie Folk. Berdine Addison, M.D.  [address]  [place]  [place] [address]  Fax: [phone]   Dr. [name]

## 2010-09-14 NOTE — Discharge Summary (Signed)
[place]. [facility]  Patient:    [name], [name]                            MRN: [id number] Adm. Date:  [id number] Disc. Date: [id number] Attending:  Georgette Shell Dictator:   Lorayne Bender, M.D. CC:         Dr. Forestine Chute, Moises Blood, M.D.             Lonzo Candy, M.D.                           Discharge Summary  DATE OF BIRTH:  December 31, 1940  DISCHARGE DIAGNOSES: 1. 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    a. CT without contrast negative.    b. MRI: Acute right parietal parasagittal cortical infarct.    c. 2-D echocardiogram:  No thrombus.  Mild tricuspid regurgitation.  Trace       mitral regurgitation.  Mild aortic regurgitation with severe asymmetric       left ventricular hypertrophy.    d. Carotid Doppler ultrasound: No significant ICA stenosis. 2. 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 3. Hypertension. 4. 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 5. 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 6. Minor/chronic renal insufficiency.  DISCHARGE MEDICATIONS: 1. Nephro-Vite 1 tablet per day. 2. Ferrous sulfate 325 mg t.i.d. 3. Clonidine 0.3 mg patch each week on Fridays. 4. Triamterene 100 mg q.d. 5. Torsemide 100 mg b.i.d. 6. K-Dur 20 mEq q.d. 7. Coumadin 5 mg q.d. 8. Norvasc 10 mg q.d. 9. Metoprolol XL 100 mg q.d.  DISCHARGE INSTRUCTIONS:  Appointment at short stay [facility] on 25 June 1999, for prothrombin time and BMET.  Later follow up with be with r. Demissachew in [place].  DIET:  Kidney failure diet, 78 g protein, 2 g sodium, 4 g potassium (as he has hypokalemia).  CONSULTATIONS:  Dr. Candis Schatz from neurology on 15 June 1999.  PROCEDURES: 1. Head CT without contrast: Hyperdense punctate foci within the basal ganglia    bilaterally attributed to calcification.  No acute intracranial    hemorrhage/edema. 2. Brain MRI: Acute right parietal parasagittal cortical infarcts/mild    atrophy and small vessel disease. 3. Carotid Dopplers: Mild noncalcific plug throughout bilaterally.  No    significant ICA stenosis, vertebral flow antegrade. 4. 2-D echocardiogram: Left ventricle normal size, no thrombus.  Severe asymmetric    left ventricular hypertrophy.  No echo findings consistent with left ventricular    outflow obstruction.  Ejection fraction normal.  Mild aortic sclerosis.  No    aortic stenosis.  Mild aortic regurgitation.  Trace mitral regurgitation. Mild    tricuspid regurgitation.  LABORATORY DATA:  Sodium 139, potassium 3.1, chloride 109, bicarb 23, glucose 83, BUN 31, creatinine 4.2, calcium 7.4, phosphorus 6.1.  White count 9.1, hemoglobin 8.0, MCV 66.1, platelets 498.  PT 12.5, PTT 32.  Iron 11, total iron binding capacity 103, saturation 11, ferritin 979.  UA: Specific gravity 1.017, pH 6.5,  protein more than 300, white blood cells 0 to 5, red blood cells 6 to 10, hyaline casts present.  HOSPITAL COURSE:  #1 -  ACUTE CEREBROVASCULAR ACCIDENT:  A 70 year old black male with history of  membranous glomerulonephritis was admitted with a two-day history of weakness in the  left lower extremity.  Head CT did not show any acute event; however, MRI showed acute stroke in the parietal lobe.  Neurology did not recommend immediate anticoagulation as it was not an acute event.  During the hospitalization, his eft lower extremity weakness improved.  Given the history of membranous glomerulonephritis/proteinuria, as he has hypercoagulopathy, we decided to put im on Coumadin to prevent CVA or any other thromboembolic events.  He as given 5 mg Coumadin on the day of discharge (20 June 1999).  He will be followed with prothrombin time on Monday, and then he will schedule appointment for the next ab work to adjust the Coumadin dose.  During hospital course,  physical therapy evaluated him and did not recommend inpatient or outpatient physical therapy for the lower extremity weakness.  #2 - MEMBRANOUS GLOMERULONEPHRITIS/CHRONIC RENAL INSUFFICIENCY:  He was on Cytoxan for a while until approximately one month ago.  However, as his renal function s worsening gradually, we decided to have an access for hemodialysis while he was in the hospital.  CVTS performed an A-V fistula in the right upper arm on 19 June 1999.  At this point, he does to need hemodialysis.  He will continue with a renal diet which is 70 g protein, 2 g sodium and 4 g potassium.  On hospital followup, be sure that he is on Os-Cal 500 mg t.i.d. with meals as his phosphorus level was high.  #3 - HYPOKALEMIA:  During hospital stay, he had hypokalemia with normal magnesium levels.  It was though to be secondary to the diuretic he was on.  After additional triamterene 100 mg q.d., his potassium was normal on 40 mEq K-Dur q.d.  On hospital followup, he needs a BMET to check the potassium level.  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  As his iron studies revealed iron deficiency during hospital stay, he received daily iron injections.  He as discharged on p.o. iron.  On hospital followup, consider epoetin injections as well.  #5 - HYPERTENSION:  On above medication, his blood pressure ran between normal range during hospital stay. DD:  06/20/99 TD:  06/20/99 Job: [id number]

## 2010-09-14 NOTE — Op Note (Signed)
[place]. [facility]  Patient:    [name], [name]                            MRN: [id number] Proc. Date: 06/19/99 Adm. Date:  [id number] Disc. Date: [id number] Attending:  Georgette Shell                           Operative Report  PREOPERATIVE DIAGNOSIS:  Chronic renal failure.  POSTOPERATIVE DIAGNOSIS:  Chronic renal failure.  OPERATION:  Placement of left upper arm arteriovenous fistula.  SURGEON:  Judeth Cornfield. Scot Dock, M.D.  ASSISTANT:  ______  ANESTHESIA:  Local sedation  TECHNIQUE:  The patient was taken to the operating room and sedated by anesthesia. The entire left upper extremity was prepped and draped in the usual sterile fashion.   After the skin was anesthetized with 1% lidocaine, a transverse incision was made at the antecubital level.  The brachial artery was dissected free here and was soft.  Next, the cephalic vein was dissected free and appeared to be usable.  I ligated this distally and gently distended it.  It easily took a 3.5 mm dilator, and I passed a 3-Fogarty catheter the entire length of the catheter.  The patient was systemically heparinized.  Clamps were placed proximally and distally on the brachial artery, and longitudinal arteriotomy was made.  The vein was mobilized  over and sewn end-to-side to the brachial artery using continuous 6-0 Prolene suture.  On completion, there was an excellent thrill in the vein.  Hemostasis as obtained in the wound, and the wound was closed with a deep layer of 3-0 Vicryl and skin closed with 4-0 Vicryl.  A sterile dressing was applied.  The patient tolerated the procedure well and was transferred to the recovery room in satisfactory condition.  All needle and sponge counts were correct. DD:  06/19/99 TD:  06/20/99 Job: [id number]

## 2010-09-14 NOTE — Group Therapy Note (Signed)
NAME:  [name], [name]                             ACCOUNT NO.:  [id number]   MEDICAL RECORD NO.:  [id number]                   PATIENT TYPE:  INP   LOCATION:  IC03                                 FACILITY:  [facility]   PHYSICIAN:  Alison Murray, M.D.             DATE OF BIRTH:  [birth year]   DATE OF PROCEDURE:  DATE OF DISCHARGE:                                   PROGRESS NOTE   REASON FOR CONSULTATION:  Upper gastrointestinal bleeding.   HISTORY OF PRESENT ILLNESS:  [name] is a 70 year old African American with  past medical history of hypertension, end stage renal disease on maintenance  hemodialysis.  He was brought to the emergency room after the patient had  coffee-ground vomitus and also black tarry stools.  [name] said that on  Friday, after he left dialysis, he had three to four diarrhea which  improved.  On the day before this admission, he started having some vomiting  of which seemed to be coffee-ground.  Hence, the patient was brought to the  emergency room.  When he was in the emergency room, the patient was also  found to be hypertensive with orthostatic hypertension, low H&H.  Hence, the  patient was admitted.  Since the patient had received some medication, at  this moment he seems to be somewhat sleepy, possibly from Phenergan and  Demerol that he received in the emergency room.  The patient denies any  fever, chills or sweating.  The patient also said that there is no one at  home with a similar problems.   PAST MEDICAL HISTORY:  1. Hypertension.  2. Right cerebrovascular accident which improved significantly over the last     years.  3. History of anemia, possibly combination from iron deficiency and history     of thalassemia, minor.  4. History of end-stage renal disease.  5. History of gastrointestinal bleeding from diverticulosis status post     colonoscopy.  6. History of liver cyst which was thought to be secondary to cavernous     hemangioma.  7.  History of left ventricular hypertrophy.   ALLERGIES:  No known drug allergies.   MEDICATIONS:  1. Unasyn 1.5 g q.12h.  He is not given any now.  2. Protonix 40 mg IV.  3. Ativan 1 mg q.4h. p.r.n. agitation.  4. Benadryl p.r.n.   SOCIAL HISTORY:  He has history of smoking.  No history of alcohol abuse.  He stopped drinking a long time ago.   FAMILY HISTORY:  Mother died at age 1 for unknown reason.  Father died at  24 with history of coronary artery disease, possibly MI.  He has a brother  who is healthy except history of hypertension and a son who is healthy.   REVIEW OF SYSTEMS:  No fever, chills or sweating.  Other diarrhea has  stopped.  He has some abdominal pain which  is making him somewhat restless.  No shortness of breath.  At this moment he feels better.  He was having  orthostatic hypertension and dizziness before he came.  No edema.   PHYSICAL EXAMINATION:  VITAL SIGNS:  Blood pressure 127/71, afebrile.  GENERAL APPEARANCE:  Somewhat sleepy but arousable.  HEENT:  No conjunctivae pallor.  Muddy sclerae.  Oral mucosa seems to be  somewhat dry.  NECK:  Supple.  No JVD.  CHEST:  Clear to auscultation.  HEART:  Regular rate and rhythm.  No murmurs.  ABDOMEN:  Soft, positive bowel sounds, nontender.  EXTREMITIES:  No edema.   LABORATORY DATA:  White blood cell count from this morning was 22.5.  Hemoglobin when he came was 10.4 and 32.5.  It presently went down to 7.3  and 22.1.  He has an NG tube with coffee-ground material.  Sodium 137,  potassium 4.8, albumin 111, creatinine 14.9, calcium 8.1.  CPK 254, MB  fraction is 7.  Relative index 2.8.  Troponin 0.08.   ASSESSMENT:  1. Upper gastrointestinal bleeding.  Etiology not clear.  Not sure whether     the patient has new onset.  CT scan of the abdomen did not show any gas     in the diaphragm.  He has no sign of perforation.  His H&H seems to be     dropping.  The patient denies any shortness of breath at this  moment.  2. End-stage.  The patient has high BUN and creatinine.  This could be     because of upper gastrointestinal bleeding.  His BUN has gone to 111,     normal potassium.  The patient does not have any sign of fluid overload.  3. Elevated cardiac enzymes.  Being evaluated by Cardiology.  4. Leukocytosis.  The patient, at this moment, does not have any fever,     chills or sweating.  Source not clear.  He has a fistula, possibly     reactive, but at this moment cannot rule out infectious etiology.  His     white blood cell count seems to be increasing.  5. Hypertension.  BP seems to be somewhat low.   RECOMMENDATIONS:  We will ____________ today.  Will plan to transfuse him  two units.  Probably type and cross for another two units.  Will do a blood  culture also.  Will hold any heparin.  Will use __________ and evaluation by  Cardiology, and at this moment since GI is on board, will follow the patient  and wait for final management.  Now we will not remove any fluid.                                               Alison Murray, M.D.    BB/MEDQ  D:  12/13/2002  T:  12/13/2002  Job:  [id number]

## 2010-09-14 NOTE — H&P (Signed)
[place]. [facility]  Patient:    [name], [name]                            MRN: [id number] Adm. Date:  [id number] Attending:  Georgette Shell Dictator:   Lorayne Bender, M.D.                         History and Physical  CHIEF COMPLAINT:  Left leg "feeling funny".  HISTORY OF PRESENT ILLNESS:  The patient is a 70 year old black male with a history of membranous glomerulonephritis, who presents with a two day history of a funny feeling, heaviness in the left lower extremity.  He denies numbness, tingling, ack pain, back injury.  No urinary incontinence.  He had nausea two days ago with one time of vomiting.  Denies decreased appetite, weight loss.  No fever or chills.  Does not have any history of diabetes.  PAST MEDICAL HISTORY: 1.  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  No     baseline creatinine in the old records. 2.  Hypertension. 3.  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 4.  A 7.5 x 7 cm mass lateral to the right kidney, rule out renal cell carcinoma.     No renal vein thrombosis by MRI in December 2000. 5.  Microcytic anemia/thalassemia minor.  FAMILY HISTORY:  Mother died during a delivery.  Father died at age 20 from acute MI.  SOCIAL HISTORY:  Lives alone in [place], [place] and works at [employer]. Rare to occasional alcohol.  Smoker of half a pack a day.  ALLERGIES:  No known drug allergies.  MEDICATIONS: 1.  Norvasc 10 mg q.d. 2.  Zaroxolyn 5 mg q.d. 3.  Demadex 100 mg b.i.d. 4.  Toprol XL 100 mg q.d. 5.  K-Dur 40 mEq q.d. 6.  Off Cytoxan about one month.  PHYSICAL EXAMINATION:  VITAL SIGNS:  Blood pressure 194/111, heart rate 86, temperature 98 degrees, respirations 16.  Oxygen saturation 96 per cent on room air.  GENERAL:  Black male in bed, in no acute distress.  SKIN:  Warm and dry.  HEENT:  Fundi benign.  PERL.  NECK:   No JVD, no  lymphadenopathy.  HEART:  Regular, no murmurs  LUNGS:  Clear to auscultation.  No wheezing.  ABDOMEN:  Nontender, nondistended.  Positive bowel sounds.  No hepatosplenomegaly.  EXTREMITIES:  1+ pitting edema bilaterally.  NEUROLOGIC:  Oriented x three, alert, awake.  Cranial nerves intact.  No sensory loss in extremities.  Left lower extremity mild proximal muscle weakness, 4+/5.  Others 5/5 motor functions.  All deep tendon reflexes normoactive.  Babinski negative bilaterally.  Cerebellar tests normal.  Gait unsteady.  LABORATORY DATA:  CBC showed hemoglobin 8.5, WBC 11.2, MCV 61; platelets 641,000. Sodium 144, potassium 3.0, chloride 118, bicarbonate 22, BUN 29, creatinine 5.0, glucose 76.  Calcium 7.8, total protein 4.7, albumin 1.2, total bilirubin 0.2, alkaline phosphatase 52, SGPT 5, SGOT 12.  Head CT shows no acute process.  ASSESSMENT:  The patient is a 69 year old black male with a history of hypertension and nephrotic syndrome, who presents with mild left lower extremity weakness going on for about two days.  Although head CT does not show an acute event a lacunar  infarct is likely.  To rule out a cortical stroke MRI may be considered.  He was on Cytoxan for  membranous glomerulonephritis until a month ago, and not aware of any neurologic side effects of Cytoxan that could explain this weakness.  When he was diagnosed with nephrotic syndrome he had an extensive work-up for a possible malignancy which all returned negative.  PLAN: 1.  Admit and get old records from the office. 2.  Neurology consultation. 3.  Add aspirin to current treatment. 4.  Discontinue Zaroxolyn because of hypokalemia and check magnesium level. Anemia     work-up, iron studies, stool guaiacs x three. 5.  Follow creatinine level.  If increases progressively then consider MRI to     rule out renal vein thrombosis.DD:  06/15/99 TD:  06/15/99 Job: [id number]

## 2010-09-15 ENCOUNTER — Inpatient Hospital Stay (HOSPITAL_COMMUNITY): Payer: Medicare Other

## 2010-09-15 LAB — BASIC METABOLIC PANEL
BUN: 28 mg/dL — ABNORMAL HIGH (ref 6–23)
Calcium: 10.1 mg/dL (ref 8.4–10.5)
Creatinine, Ser: 9.95 mg/dL — ABNORMAL HIGH (ref 0.4–1.5)
GFR calc non Af Amer: 5 mL/min — ABNORMAL LOW (ref >60)
Glucose, Bld: 94 mg/dL (ref 70–99)
Potassium: 6.2 mEq/L — ABNORMAL HIGH (ref 3.5–5.1)

## 2010-09-15 LAB — DIFFERENTIAL
Basophils Relative: 0 % (ref 0–1)
Eosinophils Absolute: 0.3 10*3/uL (ref 0.0–0.7)
Lymphocytes Relative: 25 % (ref 12–46)
Lymphs Abs: 3.7 10*3/uL (ref 0.7–4.0)
Neutro Abs: 9.4 10*3/uL — ABNORMAL HIGH (ref 1.7–7.7)

## 2010-09-15 LAB — CBC
MCH: 22 pg — ABNORMAL LOW (ref 26.0–34.0)
MCV: 69.1 fL — ABNORMAL LOW (ref 78.0–100.0)
Platelets: 306 10*3/uL (ref 150–400)
RBC: 6.54 MIL/uL — ABNORMAL HIGH (ref 4.22–5.81)

## 2010-09-16 LAB — DIFFERENTIAL
Basophils Relative: 0 % (ref 0–1)
Eosinophils Relative: 3 % (ref 0–5)
Lymphocytes Relative: 21 % (ref 12–46)
Monocytes Absolute: 1.4 10*3/uL — ABNORMAL HIGH (ref 0.1–1.0)
Neutrophils Relative %: 65 % (ref 43–77)

## 2010-09-16 LAB — BASIC METABOLIC PANEL
BUN: 19 mg/dL (ref 6–23)
CO2: 30 mEq/L (ref 19–32)
Chloride: 95 mEq/L — ABNORMAL LOW (ref 96–112)
Glucose, Bld: 89 mg/dL (ref 70–99)
Potassium: 4.9 mEq/L (ref 3.5–5.1)

## 2010-09-16 LAB — CBC
HCT: 40 % (ref 39.0–52.0)
MCV: 69 fL — ABNORMAL LOW (ref 78.0–100.0)
RBC: 5.8 MIL/uL (ref 4.22–5.81)
WBC: 13 10*3/uL — ABNORMAL HIGH (ref 4.0–10.5)

## 2010-09-17 ENCOUNTER — Other Ambulatory Visit: Payer: Self-pay | Admitting: General Surgery

## 2010-09-17 LAB — DIFFERENTIAL
Basophils Relative: 1 % (ref 0–1)
Eosinophils Absolute: 0.5 10*3/uL (ref 0.0–0.7)
Monocytes Absolute: 1.3 10*3/uL — ABNORMAL HIGH (ref 0.1–1.0)
Monocytes Relative: 12 % (ref 3–12)
Neutro Abs: 6.4 10*3/uL (ref 1.7–7.7)

## 2010-09-17 LAB — BASIC METABOLIC PANEL
BUN: 24 mg/dL — ABNORMAL HIGH (ref 6–23)
CO2: 26 mEq/L (ref 19–32)
Chloride: 90 mEq/L — ABNORMAL LOW (ref 96–112)
Creatinine, Ser: 10.38 mg/dL — ABNORMAL HIGH (ref 0.4–1.5)

## 2010-09-17 LAB — CBC
Hemoglobin: 12 g/dL — ABNORMAL LOW (ref 13.0–17.0)
MCH: 21.5 pg — ABNORMAL LOW (ref 26.0–34.0)
MCHC: 31.4 g/dL (ref 30.0–36.0)
MCV: 68.3 fL — ABNORMAL LOW (ref 78.0–100.0)

## 2010-09-17 NOTE — Consult Note (Signed)
NAMEJOSHUAN, [name]                   ACCOUNT NO.:  [id number]  MEDICAL RECORD NO.:  [id number]           PATIENT TYPE:  I  LOCATION:  [id number]                          FACILITY:  [facility]  PHYSICIAN:  Alison Murray, M.D.DATE OF BIRTH:  10/29/1940  DATE OF CONSULTATION:  09/14/2010 DATE OF DISCHARGE:                                CONSULTATION   REASON FOR CONSULTATION:  End-stage renal disease for hemodialysis.  [name] is a 70 years old gentleman who is well-known to me who has history of end-stage renal disease, on maintenance hemodialysis, history of hypertension presently came with abdominal pain and distention. According to the patient, he was having pain since yesterday, and he could not move his bowels and because of that, he came to the emergency room and presently admitted to the hospital for further workup and management.  According to the patient, he still has the pain.  However, he moved his bowel one time after he came to the hospital.  He deniesany fevers, chills, or sweating.  PAST MEDICAL HISTORY: 1. He has history of end-stage renal disease.  He is on maintenance     hemodialysis on Monday, Wednesday, and Friday.  He is due for     today. 2. History of hypertension. 3. History of GI bleeding. 4. History of mild CVA. 5. History of neuropathy. 6. History of cavernous hemangioma of the liver. 7. History of recurrent access failure. 8. 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 9. History of right AV fistula placement and ligation of the fistula     because of steal syndrome, and he presently has a catheter.  MEDICATIONS: 1. Lopressor 5 mg IV q.6 h. 2. Protonix 40 mg p.o. daily. 3. Zosyn, he is getting 2.25 g IV q.8 h. 4. He is getting also IV fluids at 80 mL per hour, everything else is     p.r.n.  ALLERGIES:  He is allergic to ASPIRIN.  SOCIAL HISTORY:  No history of smoking.  No history of alcohol abuse.  FAMILY HISTORY:  Noncontributory.  REVIEW  OF SYSTEMS:  Mainly abdominal pain, diffuse, and this is associated with abdominal distention and also constipation.  The patient denies any fevers, chills, or sweating.  He has moved his bowels since he came to the hospital.  There is no blood or any other finding. However, even though he moves his bowel, still he has some pain.  PHYSICAL EXAMINATION:  VITAL SIGNS:  Temperature is 97.5, blood pressure 111/69, pulse of 81. CHEST:  Clear to auscultation.  No rales.  No rhonchi.  No egophony. HEART:  Regular rate and rhythm.  No murmur.  No S3. ABDOMEN:  Slightly distended, somewhat tender.  No rebound tenderness. However, seems to be somewhat tympanic. EXTREMITIES:  He does not have any edema.  His white blood cell count is 12.4, hemoglobin 13.7, hematocrit 43.7, platelet of 316.  His sodium is 135, potassium 5.4, BUN is 34, creatinine 11.36.  His alk phosphate is 125, albumin is 3.4.  He has abdominal x-ray, which showed no acute abdominal findings.  ASSESSMENT: 1. Abdominal pain  with constipation and also some nausea, but no     vomiting.  Presently, still he has bowel.  He does not have any     fevers, chills, or sweating. 2. End-stage renal disease.  He is status post hemodialysis on     Wednesday.  BUN and creatinine within acceptable range.  However,     his potassium seems to be somewhat high.  He is due for dialysis     today. 3. History of hypertension, blood pressure seems to be reasonably     controlled. 4. Previous history of gastrointestinal bleeding. 5. History of mild cerebrovascular accident. 6. History of cavernous hemangioma of the liver. 7. History of thalassemia and anemia of chronic disease. 8. History of recurrent loss of fistula and graft. 9. 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 10.History of gangrene of his foot and infection, treated at [facility].  RECOMMENDATIONS:  We  will make arrangement for the patient to get dialysis.  If he is going to have surgery today that is by history, probably we will not use any heparin and we will use 2K/2.5 calcium bath.  Probably, we will try to get about 2 L.  We will continue his other medications.  If he is not going to have surgery, however, probably we will use some heparin, and we will treat him for regular dialysis duration.     Alison Murray, M.D.     BB/MEDQ  D:  09/14/2010  T:  09/14/2010  Job:  [id number]  Electronically Signed by Alison Murray M.D. on 09/17/2010 01:42:52 PM

## 2010-09-18 ENCOUNTER — Inpatient Hospital Stay (HOSPITAL_COMMUNITY): Payer: Medicare Other

## 2010-09-18 LAB — BASIC METABOLIC PANEL
BUN: 36 mg/dL — ABNORMAL HIGH (ref 6–23)
Chloride: 91 mEq/L — ABNORMAL LOW (ref 96–112)
GFR calc non Af Amer: 4 mL/min — ABNORMAL LOW (ref >60)
Potassium: 4.7 mEq/L (ref 3.5–5.1)
Sodium: 129 mEq/L — ABNORMAL LOW (ref 135–145)

## 2010-09-18 LAB — DIFFERENTIAL
Lymphocytes Relative: 21 % (ref 12–46)
Lymphs Abs: 2.2 10*3/uL (ref 0.7–4.0)
Monocytes Relative: 9 % (ref 3–12)
Neutro Abs: 7 10*3/uL (ref 1.7–7.7)
Neutrophils Relative %: 67 % (ref 43–77)

## 2010-09-18 LAB — CBC
HCT: 36.1 % — ABNORMAL LOW (ref 39.0–52.0)
Hemoglobin: 11.4 g/dL — ABNORMAL LOW (ref 13.0–17.0)
MCH: 21.4 pg — ABNORMAL LOW (ref 26.0–34.0)
MCV: 67.7 fL — ABNORMAL LOW (ref 78.0–100.0)
Platelets: 274 10*3/uL (ref 150–400)
RBC: 5.33 MIL/uL (ref 4.22–5.81)
WBC: 10.5 10*3/uL (ref 4.0–10.5)

## 2010-10-17 NOTE — Op Note (Signed)
NAMEKIRKPATRICK, MONCLOVA                   ACCOUNT NO.:  [id number]  MEDICAL RECORD NO.:  [id number]           PATIENT TYPE:  I  LOCATION:  [id number]                          FACILITY:  [facility]  PHYSICIAN:  Chelsea Primus, MD      DATE OF BIRTH:  Jun 19, 1940  DATE OF PROCEDURE:  09/17/2010 DATE OF DISCHARGE:                              OPERATIVE REPORT   PREOPERATIVE DIAGNOSES:  Acute cholecystitis.  POSTOPERATIVE DIAGNOSIS:  Acute cholecystitis.  PROCEDURE:  Laparoscopic cholecystectomy.  SURGEON:  Chelsea Primus, MD  ANESTHESIA:  General endotracheal local anesthetic with 0.5% Sensorcaine plain.  SPECIMEN:  Gallbladder.  ESTIMATED BLOOD LOSS:  Less than 100 mL.  INDICATIONS:  The patient is a 70 year old male with a history of end- stage renal disease who presented to [facility] with right upper quadrant abdominal pain.  It had actually been worked up previously and was actually had known biliary disease.  However, prior to elective intervention, the patient had an acute episode of cholecystitis.  He was admitted for continued management and intervention.  Due to his end-stage renal disease, however, the patient did require several rounds of hemodialysis to adequately resuscitate him to a point of proceeding to the operating room.  At this point, his electrolytes were within normal limits other than his elevated creatinine and plans are for proceeding to the operating room.  Risks, benefits, and alternatives were discussed at length with the patient including but not limited to risk of bleeding, infection, bile leak, possibility of intraoperative cardiac and pulmonary events, bile duct injury, small bowel injury.  The patient is aware and consents to proceed.  OPERATION:  The patient was taken to the operating room and was placed in supine position on the operating table at which time, the general anesthetic was administered.  Once the patient was asleep, he  was endotracheally intubated by Anesthesia.  At this point, his abdomen was prepped with DuraPrep solution and draped in standard fashion.  A stab incision was created supraumbilically with an 11 blade scalpel. Additional dissection down to the subcuticular tissue, was carried out using a Kocher clamp, which was utilized to grasp the anterior abdominal fascia and moved this anteriorly.  A Veress needle was inserted.  Saline drop test was utilized to confirm intraperitoneal placement then pneumoperitoneum was initiated.  Once sufficient pneumoperitoneum was obtained, an 11-mm trocar was inserted over laparoscope allowing visualization of the trocar entering into the peritoneal cavity.  At this time, the inner cannula was removed.  The laparoscope was reinserted.  There was no evidence of any trocar or Veress needle placement injury.  At this time, the remaining trocars were placed with an 11-mm trocar in the epigastrium, a 5-mm trocar in the midline between two 11-mm trocars and a 5-mm trocar in the right lateral abdominal wall. The patient was placed into a reverse Trendelenburg left lateral decubitus position.  The fundus of the gallbladder was grasped and lifted up and over the right lobe of the liver.  Due to distention, I did use a Weck needle to aspirate the fluid out of the gallbladder. This  did return back bilious in nature without any evidence of hydrops. This did allow for adequate decompression of the gallbladder to adequately grasp it, lifted up and over the right lobe of the liver.  At this time, the peritoneal reflection was bluntly stripped off the infundibulum exposing the cystic duct as it enters into the infundibulum.  Three Endoclips were placed proximally, one distally, and cystic duct was divided between the two most distal clips.  Similarly, the cystic artery was identified.  Two Endoclips were placed proximally, one distally, and the cystic artery was divided between  the two most distal clips.  During the dissection, the posterior branch of the cystic artery was encountered.  This was also clipped with two Endoclips and dissection was continued with electrocautery to dissect the gallbladder free from the gallbladder fossa.  Once it was freed, it was placed into an EndoCatch bag and placed up and over the right lobe of the liver.  At this time, inspection of the gallbladder demonstrated adequate hemostasis having been obtained with electrocautery.  However, the gallbladder fossa was quite raw, and therefore after copiously irrigating the field, I opted to place a piece of Surgicel snow into the gallbladder fossa to help with continued hemostasis.  The Endoclips were noted to be in excellent position.  There was no evidence of any bleeding or bile leak from any of the clips.  At this time, attention was turned to closure.  Using an Endoclose suture passing device, 2-0 Vicryl suture was passed through both the 11-mm trocar sites.  With these sutures in place, the gallbladder was retrieved and removed through the umbilical trocar site. Some blunt and sharp dissection was required to adequately dilate the umbilical trocar site enough to remove the gallbladder in an intact EndoCatch bag, which was placed in the back table and sent as primary specimen to Pathology.  At this time, pneumoperitoneum was evacuated, the Vicryl sutures were secured.  The local anesthetic was instilled.  4- 0 Monocryl was utilized to reapproximate the skin edges at all four trocar sites.  The skin was washed and dried with moist dry towel. Benzoin was applied around the incision.  Half inch Steri-Strips were placed.  Drapes were removed. The patient was allowed to come out of general anesthetic.  He was transferred back to regular hospital bed in stable condition.  At the conclusion of the procedure, all instrument, sponge, needle counts were correct.  The patient tolerated the  procedure extremely well.     Chelsea Primus, MD     BZ/MEDQ  D:  09/17/2010  T:  09/18/2010  Job:  [id number]  Electronically Signed by Chelsea Primus MD on 10/17/2010 11:47:53 AM

## 2010-10-17 NOTE — Op Note (Signed)
  NAMEZEUS, PROPP NO.:  [id number]  MEDICAL RECORD NO.:  [id number]           PATIENT TYPE:  LOCATION:                                 FACILITY:  PHYSICIAN:  Chelsea Primus, MD      DATE OF BIRTH:  05-Dec-1940  DATE OF PROCEDURE:  09/15/2010 DATE OF DISCHARGE:                              OPERATIVE REPORT   PREPROCEDURE DIAGNOSES:  End-stage renal disease, chronic thrombophlebitis, hypotension.  POSTPROCEDURE DIAGNOSES:  End-stage renal disease, chronic thrombophlebitis, hypotension.  PROCEDURE:  Left subclavian vein triple-lumen catheter insertion.  SURGEON:  Dr. Chelsea Primus.  ANESTHETIC:  Lidocaine plain 1%.  ESTIMATED BLOOD LOSS:  Minimal.  INDICATIONS:  The patient is a 70 year old male with end-stage renal disease well-known to myself admitted for acute cholecystitis. Unfortunately his end-stage renal disease has limited his progress and has required hemodialysis to attempt to change or improve hishyperkalemia.  A peripheral IV had been obtained, but was quickly lost and due to the patient's chronic thrombophlebitis, a new peripheral IV site has not been able to be obtained.  Additionally, due to his renal history he is not a candidate for PICC line due to PICC line protocol. He does have a right-sided was created AV fistula and a thrombosed left arm AV fistula that is again no longer working.  Due to this, we are limited on access however, risks, benefits and alternatives of a left septal subclavian vein triple-lumen catheter was discussed at length with the patient including bleeding, infection and pneumothorax.  His questions and concerns were addressed and he consented to placement of a triple-lumen catheter.  PROCEDURE:  At this time, the patient was placed into a Trendelenburg position.  His left neck and chest wall was prepped with chlorhexidine solution.  Drapes were placed, 1% lidocaine was injected along the planned course of  venipuncture, 18 gauge introducer needle was utilized to identify the left subclavian vein.  Good venous return was obtained. J-wire was advanced without difficulties and then using standard sterile Seldinger technique, a triple-lumen catheter was advanced to 17.5 cm, good venous return was obtained in all 3 ports and easily flushed.  The catheter was secured to the skin with a 2-0 silk x3 for redundancy.  A sterile dressing was placed.  Sharps were disposed of in proper accordance.  The patient did have a stat portable chest x-ray ordered which demonstrated no evidence any pneumothorax or complications and good positioning of the catheter.  The patient tolerated the procedure extremely well.     Chelsea Primus, MD     BZ/MEDQ  D:  09/15/2010  T:  09/16/2010  Job:  [id number]  Electronically Signed by Chelsea Primus MD on 10/17/2010 11:47:50 AM

## 2010-11-06 NOTE — Assessment & Plan Note (Signed)
OFFICE VISIT  Joseph, [name] DOB:  08/16/1940                                       06/15/2010 CHART#:[id number]  This is an established patient followup.  HISTORY OF PRESENT ILLNESS:  This is a 70 year old gentleman with bilateral foot ulcers the right greater than left that was sent to me for an angioplasty that is predominantly for his right foot for management of nonhealing foot ulcers.  During that procedure on 05/24/2010 he underwent an angioplasty of the right anterior tibial artery x3 with good response to the angioplasty.  However, at the right anterior tibial artery origin there was a calcific stenosis that was not responsive to angioplasty.  As this is his only inline flow to his right foot the plan was to allow him to recover and see how much of his tissue he could heal with the increased flow from the procedure and at this point the patient has undergone a right fifth toe amputation.  He is continuing to undergo wound care to try to salvage the right foot.  He does note some pain continuing in the right foot greater than the left.  PHYSICAL EXAMINATION:  On physical examination today he had a blood pressure of 128/87 with a heart rate of 114, respirations were 22.  On his examination bilateral groins are palpable femoral pulses.  There is no evidence of any hematoma at the cannulation site and no evidence of a pseudoaneurysm.  The right foot is actually warmer than the left foot at this point.  There are multiple ischemic areas on the right foot.  There is an open wound at this site of the fifth toe amputation and then there are some ulcerated areas on the plantar surface of this right foot. There is no frank drainage from any of the wounds at this point.  NONINVASIVE VASCULAR IMAGING:  He had bilateral ABIs which demonstrate right side ABI of 0.82 with improved dorsalis pedis index, on the left side the ABI was 0.82.  The dorsalis pedis  bilaterally are biphasic and posterior tibials are monophasic.  MEDICAL DECISION MAKING:  This is a 70 year old gentleman who has undergone a successful angioplasty of the right anterior tibial artery. The ABIs demonstrate improvement of flow to the anterior tibial, however, it is evident that without additional blood flow it is unlikely to completely heal his wounds in his right foot.  Looking at the distal targets in this gentleman on digital caliper measurement his anterior tibial artery was less than 3 mm, only about 2.5 cm and was quite calcific throughout.  So I have concerns that any bypass to the anterior tibial is doomed to fail due to the small size and highly calcified artery.  Additionally at the orifice of the anterior tibial artery there is a stenosis that was not responsive to standard angioplasty.  My recommendation for this patient would be to consider possibly orbital atherectomy with repeat angioplasty in him.  We discussed the risks of this which include possible dissection and subsequent thrombosis of this right anterior tibial artery.  However, he is aware that he is likely to lose the right foot with additional intervention so as we discussed it would be worth making an attempt to try to salvage his right anterior tibial artery in order to try to make an attempt to salvage the right foot.  Otherwise  he is likely going to require at minimal a below the knee amputation in the near future.  He is aware of the risks of this procedure which we discussed as previously, bleeding, infection, possible access complications, possible rupture of the artery, possible dissection, possible embolization, possible need for emergent surgical intervention, and possible need for additional procedures.  He is aware of such and has agreed to proceed forward.  Tentatively we will schedule him for this coming Thursday for intervention, have the [facility] rep for the orbital atherectomy  device present.  Also Dr. Oneida Alar would like to be present also and we will try to arrange the schedule so that he can be present for this procedure.    Conrad [place], MD Electronically Signed  BLC/MEDQ  D:  06/15/2010  T:  06/18/2010  Job:  [id number]

## 2011-01-24 LAB — CBC
HCT: 52.1 — ABNORMAL HIGH
Hemoglobin: 16
MCHC: 30.6
MCV: 72.4 — ABNORMAL LOW
Platelets: 313
RBC: 7.19 — ABNORMAL HIGH
RDW: 18.8 — ABNORMAL HIGH
WBC: 11.8 — ABNORMAL HIGH

## 2011-01-24 LAB — DIFFERENTIAL
Basophils Absolute: 0.1
Basophils Relative: 1
Eosinophils Absolute: 0.3
Eosinophils Relative: 2
Lymphocytes Relative: 25
Lymphs Abs: 2.9
Monocytes Relative: 9
Neutro Abs: 7.4
Neutrophils Relative %: 63

## 2011-01-24 LAB — BASIC METABOLIC PANEL
BUN: 37 — ABNORMAL HIGH
CO2: 26
Calcium: 8.7
Chloride: 104
Creatinine, Ser: 13.18 — ABNORMAL HIGH
GFR calc Af Amer: 5 — ABNORMAL LOW
GFR calc non Af Amer: 4 — ABNORMAL LOW
Glucose, Bld: 106 — ABNORMAL HIGH
Potassium: 4.2
Sodium: 141

## 2011-02-09 ENCOUNTER — Emergency Department (HOSPITAL_COMMUNITY)
Admission: EM | Admit: 2011-02-09 | Discharge: 2011-02-10 | Disposition: A | Discharge status: Home / Self Care | Payer: Medicare Other | Attending: Emergency Medicine | Admitting: Emergency Medicine

## 2011-02-09 ENCOUNTER — Encounter: Payer: Self-pay | Admitting: *Deleted

## 2011-02-09 DIAGNOSIS — K529 Noninfective gastroenteritis and colitis, unspecified: Secondary | ICD-10-CM

## 2011-02-09 DIAGNOSIS — N289 Disorder of kidney and ureter, unspecified: Secondary | ICD-10-CM | POA: Insufficient documentation

## 2011-02-09 DIAGNOSIS — R109 Unspecified abdominal pain: Secondary | ICD-10-CM | POA: Insufficient documentation

## 2011-02-09 DIAGNOSIS — I10 Essential (primary) hypertension: Secondary | ICD-10-CM | POA: Insufficient documentation

## 2011-02-09 DIAGNOSIS — R197 Diarrhea, unspecified: Secondary | ICD-10-CM | POA: Insufficient documentation

## 2011-02-09 DIAGNOSIS — K5289 Other specified noninfective gastroenteritis and colitis: Secondary | ICD-10-CM | POA: Insufficient documentation

## 2011-02-09 LAB — CBC
MCH: 22.3 pg — ABNORMAL LOW (ref 26.0–34.0)
MCV: 66.7 fL — ABNORMAL LOW (ref 78.0–100.0)
Platelets: 309 10*3/uL (ref 150–400)
RDW: 19.4 % — ABNORMAL HIGH (ref 11.5–15.5)
WBC: 17.1 10*3/uL — ABNORMAL HIGH (ref 4.0–10.5)

## 2011-02-09 LAB — COMPREHENSIVE METABOLIC PANEL
ALT: 10 U/L (ref 0–53)
AST: 15 U/L (ref 0–37)
Albumin: 3.5 g/dL (ref 3.5–5.2)
Calcium: 9.4 mg/dL (ref 8.4–10.5)
Chloride: 93 mEq/L — ABNORMAL LOW (ref 96–112)
Creatinine, Ser: 10.44 mg/dL — ABNORMAL HIGH (ref 0.50–1.35)
Sodium: 131 mEq/L — ABNORMAL LOW (ref 135–145)

## 2011-02-09 MED ORDER — PANTOPRAZOLE SODIUM 40 MG PO TBEC
40.0000 mg | DELAYED_RELEASE_TABLET | Freq: Once | ORAL | Status: AC
  Administered 2011-02-09: 40 mg via ORAL
  Filled 2011-02-09: qty 1

## 2011-02-09 MED ORDER — LOPERAMIDE HCL 2 MG PO CAPS
4.0000 mg | ORAL_CAPSULE | Freq: Once | ORAL | Status: AC
  Administered 2011-02-09: 4 mg via ORAL
  Filled 2011-02-09: qty 2

## 2011-02-09 MED ORDER — ONDANSETRON 8 MG PO TBDP
8.0000 mg | ORAL_TABLET | Freq: Once | ORAL | Status: AC
  Administered 2011-02-09: 8 mg via ORAL
  Filled 2011-02-09: qty 1

## 2011-02-09 NOTE — ED Notes (Signed)
Pt reports lower abd pain starting this afternoon while at a picnic, pt had gallbladder removed 2 mts ago

## 2011-02-09 NOTE — ED Provider Notes (Signed)
History     CSN: [id number] Arrival date & time: 02/09/2011 10:21 PM  Chief Complaint  Patient presents with  . Abdominal Pain    (Consider location/radiation/quality/duration/timing/severity/associated sxs/prior treatment) HPI This 70 year old male has almost 12 hours of lower abdominal waxing and waning cramping but constant mild to moderate pain which is nonradiating and is associated with about 5 nonbloody loose stools as well as nausea but no vomiting, chest pain, shortness of breath, cough, fever, lightheadedness. The patient makes no urine. He is dialyzed Monday, Wednesday, and Friday. There is no prior treatment at home. He is not tender when he pushes on his abdomen. He did not eat anything all day today. He has no fever no bloody diarrhea no travel history recently. Past Medical History  Diagnosis Date  . Hypertension   . Renal disorder     Past Surgical History  Procedure Date  . Cholecystectomy   . Arteriovenous graft placement     No family history on file.  History  Substance Use Topics  . Smoking status: Never Smoker   . Smokeless tobacco: Not on file  . Alcohol Use: No      Review of Systems  Constitutional: Positive for appetite change. Negative for fever.       10 Systems reviewed and are negative for acute change except as noted in the HPI.  HENT: Negative for congestion.   Eyes: Negative for discharge and redness.  Respiratory: Negative for cough and shortness of breath.   Cardiovascular: Negative for chest pain.  Gastrointestinal: Positive for nausea, abdominal pain and diarrhea. Negative for vomiting and blood in stool.  Musculoskeletal: Negative for back pain.  Skin: Negative for rash.  Neurological: Negative for dizziness, syncope, light-headedness, numbness and headaches.  Psychiatric/Behavioral:       No behavior change.    Allergies  Asa-ca carbonate  Home Medications  No current outpatient prescriptions on file.  BP 135/85  Pulse  100  Temp(Src) 98.2 F (36.8 C) (Oral)  Resp 16  Ht 5\' 5"  (1.651 m)  Wt 188 lb (85.276 kg)  BMI 31.28 kg/m2  SpO2 99%  Physical Exam  Nursing note and vitals reviewed. Constitutional: He is oriented to person, place, and time.       Awake, alert, nontoxic appearance.  Normal speech and gait.  HENT:  Head: Atraumatic.  Mouth/Throat: Oropharynx is clear and moist.  Eyes: Conjunctivae are normal. Pupils are equal, round, and reactive to light. Right eye exhibits no discharge. Left eye exhibits no discharge.  Neck: Neck supple.  Cardiovascular: Normal rate and regular rhythm.   No murmur heard. Pulmonary/Chest: Effort normal and breath sounds normal. No respiratory distress. He has no wheezes. He has no rales. He exhibits no tenderness.  Abdominal: Soft. Bowel sounds are normal. He exhibits no distension and no mass. There is no tenderness. There is no rebound and no guarding.  Genitourinary:       Scrotum is nontender  Musculoskeletal: Normal range of motion. He exhibits no edema and no tenderness.       Baseline ROM, no obvious new focal weakness.  Neurological: He is alert and oriented to person, place, and time.       Mental status and motor strength appears baseline for patient and situation.  Skin: No rash noted.  Psychiatric: He has a normal mood and affect.    ED Course  Procedures (including critical care time)  Labs Reviewed  CBC - Abnormal; Notable for the following:    WBC  17.1 (*)    RBC 7.36 (*)    MCV 66.7 (*)    MCH 22.3 (*)    RDW 19.4 (*)    All other components within normal limits  COMPREHENSIVE METABOLIC PANEL - Abnormal; Notable for the following:    Sodium 131 (*)    Chloride 93 (*)    CO2 18 (*)    Glucose, Bld 111 (*)    BUN 32 (*)    Creatinine, Ser 10.44 (*)    Total Protein 9.0 (*)    GFR calc non Af Amer 4 (*)    GFR calc Af Amer 5 (*)    All other components within normal limits  LAB REPORT - SCANNED   No results found.   1.  Diarrhea   2. Abdominal pain   3. Gastroenteritis       MDM  I doubt any other [facility] precluding discharge at this time including, but not necessarily limited to the following:peritonitis.        Babette Relic, MD 02/12/11 [phone]

## 2011-02-10 NOTE — Progress Notes (Signed)
2350 Assumed care/disposition of patient. Patient is ESRD with recent h/o diarrhea illness and lower abdominal pain.Has been given immodium with slowing of the diarrrhea. Pain has improved. Awaiting labs.  [place] with elevated BUN<Cr c/w ESRD. Elevated WBC c/w viral gastroenteritis. VSS. No elevated temperature. Patient dialyzes M-W-F. Pt feels improved after observation and/or treatment in ED.Pt stable in ED with no significant deterioration in condition.The patient appears reasonably screened and/or stabilized for discharge and I doubt any other medical condition or other [facility] requiring further screening, evaluation, or treatment in the ED at this time prior to discharge.Discharge instruction completed.

## 2011-03-04 NOTE — H&P (Signed)
[name], [name] NO.:  [id number]  MEDICAL RECORD NO.:  [id number]  LOCATION:  [id number]                          FACILITY:  [facility]  PHYSICIAN:  Chelsea Primus, MD      DATE OF BIRTH:  08-26-1940  DATE OF ADMISSION:  09/13/2010 DATE OF DISCHARGE:  05/22/2012LH                             HISTORY & PHYSICAL   CHIEF COMPLAINT:  Right upper quadrant abdominal pain.  HISTORY OF PRESENT ILLNESS:  The patient is a 70 year old male with a history of end-stage renal disease, atherosclerotic vascular disease, and hypertension who presented to [facility] with right upper quadrant abdominal pain.  Workup and evaluation was consistent for acute cholecystitis.  He had no evidence of any fevers, chills, nausea, vomiting, or bowel changes.  No melena, no hematochezia, no acholic stools.  No history of jaundice.  Symptoms had actually started after his last round of hemodialysis.  PAST MEDICAL HISTORY: 1. End-stage renal disease. 2. Hypertension. 3. History of GI bleed. 4. History of CVA. 5. Neuropathy. 6. Cavernous hemangioma of the liver.  PAST SURGICAL HISTORY:  Toe amputation and multiple AV fistula placements.  MEDICATIONS:  Lopressor, Protonix, is currently receiving Zosyn in the hospital.  ALLERGIES:  ASPIRIN.  SOCIAL HISTORY:  No history of tobacco, no current alcohol or recreational drug abuse.  FAMILY HISTORY:  Noncontributory.  REVIEW OF SYSTEMS:  Unremarkable other than gastrointestinal as per HPI, otherwise all systems are unremarkable.  PHYSICAL EXAMINATION:  VITAL SIGNS:  Temperature 98.2, heart rate 83, respirations 20, blood pressure 110/69. GENERAL:  The patient is not in any acute distress.  He is alert.  He is oriented. HEENT:  Scalp, no deformities or masses.  Eyes, pupils equal, round, reactive.  No conjunctival pallor is noted.  Oral mucosa is pink. Normal occlusion. NECK:  Trachea is midline.  No cervical  lymphadenopathy. PULMONARY:  Unlabored respirations.  Clear to auscultation bilaterally. CARDIOVASCULAR:  Regular rate and rhythm.  No murmurs or gallops. ABDOMEN:  Positive bowel sounds. ABDOMEN:  Soft, somewhat distended.  Does have some right upper quadrant abdominal pain.  No clear classic Murphy sign.  No diffuse peritoneal signs. EXTREMITIES:  Warm and dry.  PERTINENT LAB WORK:  The patient does have a elevated white blood cell count of 13086, hemoglobin is 13000.  Acute abdominal series demonstrates no free air, and unremarkable bowel gas distribution.  ASSESSMENT/PLAN:  Acute cholecystitis.  At this point due to his renal history, Nephrology consultation with Dr. Lowanda Foster will be obtained. Once his renal disease is stabilized and the patient is resuscitated, we will plan to proceed to the operating room.  At this point, we will continue IV antibiotics, IV fluid hydration, and limited oral intake to continue conservative bowel rest.  Risks, benefits, and alternatives of proceeding to the operating room for laparoscopic possible open cholecystectomy were discussed at length with the patient.  The patient understands these risks.  We will continue to follow the patient's clinical progression.     Chelsea Primus, MD     BZ/MEDQ  D:  03/04/2011  T:  03/04/2011  Job:  [id number]

## 2011-03-04 NOTE — Discharge Summary (Signed)
NAMEAMEN, DEVINCENTIS NO.:  [id number]  MEDICAL RECORD NO.:  [id number]  LOCATION:  [id number]                          FACILITY:  [facility]  PHYSICIAN:  Chelsea Primus, MD      DATE OF BIRTH:  1940-05-08  DATE OF ADMISSION:  09/13/2010 DATE OF DISCHARGE:  05/22/2012LH                              DISCHARGE SUMMARY   ADMISSION DIAGNOSIS:  Right upper quadrant abdominal pain.  DISCHARGE DIAGNOSES:  Status post cholecystectomy, history of end-stage renal disease, hypertension, history of gastrointestinal bleeding, history of cerebrovascular accident, history of neuropathy, history of arteriovenous fistula.  BRIEF H AND P:  Please see admission history and physical for complete H and P.  The patient is a 71 year old male who presented to [facility] with right upper quadrant abdominal pain.  The patient is well known to me  for access issues in the past due to his end-stage renal disease.  He presented with right upper quadrant abdominal pain, workup and evaluation was consistent for acute cholecystitis.  He was admitted for continued hemodialysis management prior to proceeding to the operating room.  HOSPITAL COURSE:  The patient was admitted on Sep 13, 2010.  He was continued on IV fluid hydration, continued on IV antibiotics, and continued on hemodialysis.  Due to limited access, triple-lumen catheter was placed on Sep 15, 2010 for continued IV fluid hydration, medications, and monitoring.  Following hemodialysis and improvement of his renal function.  The patient was taken to the OR on Sep 17, 2010. He did undergo a laparoscopic cholecystectomy which he tolerated well. Following this, he has been a brief period in the [facility] and was transferred back to Medical Surgical Floor.  He did have another around of hemodialysis the following postoperative day, and he was stable both from postoperative and renal status and plans were made for discharge  to home.  BRIEF DISCHARGE INSTRUCTIONS:  The patient is to increase activity as tolerated, not to lift anything greater than 20 pounds for the next 4 weeks.  He is to call with any questions, problems or concerns.  He is to continue his dialysis on his normal schedule.  DISCHARGE MEDICATIONS:  Please see the discharge medication reconciliation sheet for all discharge medications.     Chelsea Primus, MD     BZ/MEDQ  D:  03/04/2011  T:  03/04/2011  Job:  [id number]

## 2011-05-01 DIAGNOSIS — N2581 Secondary hyperparathyroidism of renal origin: Secondary | ICD-10-CM | POA: Diagnosis not present

## 2011-05-01 DIAGNOSIS — D509 Iron deficiency anemia, unspecified: Secondary | ICD-10-CM | POA: Diagnosis not present

## 2011-05-01 DIAGNOSIS — N186 End stage renal disease: Secondary | ICD-10-CM | POA: Diagnosis not present

## 2011-05-18 ENCOUNTER — Emergency Department (HOSPITAL_COMMUNITY)
Admission: EM | Admit: 2011-05-18 | Discharge: 2011-05-18 | Disposition: A | Discharge status: Home / Self Care | Payer: Medicare Other | Attending: Emergency Medicine | Admitting: Emergency Medicine

## 2011-05-18 ENCOUNTER — Emergency Department (HOSPITAL_COMMUNITY): Payer: Medicare Other

## 2011-05-18 ENCOUNTER — Encounter (HOSPITAL_COMMUNITY): Payer: Self-pay

## 2011-05-18 DIAGNOSIS — Z992 Dependence on renal dialysis: Secondary | ICD-10-CM | POA: Insufficient documentation

## 2011-05-18 DIAGNOSIS — R109 Unspecified abdominal pain: Secondary | ICD-10-CM | POA: Insufficient documentation

## 2011-05-18 DIAGNOSIS — N289 Disorder of kidney and ureter, unspecified: Secondary | ICD-10-CM | POA: Diagnosis not present

## 2011-05-18 DIAGNOSIS — R112 Nausea with vomiting, unspecified: Secondary | ICD-10-CM | POA: Diagnosis not present

## 2011-05-18 DIAGNOSIS — Z9889 Other specified postprocedural states: Secondary | ICD-10-CM | POA: Diagnosis not present

## 2011-05-18 DIAGNOSIS — J449 Chronic obstructive pulmonary disease, unspecified: Secondary | ICD-10-CM | POA: Diagnosis not present

## 2011-05-18 DIAGNOSIS — I10 Essential (primary) hypertension: Secondary | ICD-10-CM | POA: Insufficient documentation

## 2011-05-18 DIAGNOSIS — K5289 Other specified noninfective gastroenteritis and colitis: Secondary | ICD-10-CM | POA: Diagnosis not present

## 2011-05-18 DIAGNOSIS — K529 Noninfective gastroenteritis and colitis, unspecified: Secondary | ICD-10-CM

## 2011-05-18 LAB — CBC
HCT: 46.9 % (ref 39.0–52.0)
Hemoglobin: 14.9 g/dL (ref 13.0–17.0)
MCH: 21.9 pg — ABNORMAL LOW (ref 26.0–34.0)
MCV: 69.1 fL — ABNORMAL LOW (ref 78.0–100.0)
RBC: 6.79 MIL/uL — ABNORMAL HIGH (ref 4.22–5.81)

## 2011-05-18 LAB — BASIC METABOLIC PANEL
BUN: 24 mg/dL — ABNORMAL HIGH (ref 6–23)
Calcium: 10 mg/dL (ref 8.4–10.5)
Creatinine, Ser: 9.67 mg/dL — ABNORMAL HIGH (ref 0.50–1.35)
GFR calc non Af Amer: 5 mL/min — ABNORMAL LOW (ref >90)
Glucose, Bld: 142 mg/dL — ABNORMAL HIGH (ref 70–99)
Potassium: 3.5 mEq/L (ref 3.5–5.1)

## 2011-05-18 LAB — DIFFERENTIAL
Eosinophils Absolute: 0.1 10*3/uL (ref 0.0–0.7)
Eosinophils Relative: 0 % (ref 0–5)
Lymphs Abs: 0.3 10*3/uL — ABNORMAL LOW (ref 0.7–4.0)
Monocytes Absolute: 0.7 10*3/uL (ref 0.1–1.0)
Monocytes Relative: 4 % (ref 3–12)

## 2011-05-18 MED ORDER — DIPHENOXYLATE-ATROPINE 2.5-0.025 MG PO TABS: 1.0000 | ORAL_TABLET | Freq: Four times a day (QID) | ORAL | Status: AC | PRN

## 2011-05-18 MED ORDER — ONDANSETRON HCL 4 MG/2ML IJ SOLN: 4.0000 mg | Freq: Once | INTRAMUSCULAR | Status: DC

## 2011-05-18 MED ORDER — DIPHENOXYLATE-ATROPINE 2.5-0.025 MG PO TABS
2.0000 | ORAL_TABLET | Freq: Once | ORAL | Status: AC
  Administered 2011-05-18: 2 via ORAL
  Filled 2011-05-18: qty 2

## 2011-05-18 MED ORDER — CIPROFLOXACIN HCL 500 MG PO TABS: 500.0000 mg | ORAL_TABLET | Freq: Two times a day (BID) | ORAL | Status: AC

## 2011-05-18 MED ORDER — SODIUM CHLORIDE 0.9 % IV BOLUS (SEPSIS): 250.0000 mL | Freq: Once | INTRAVENOUS | Status: DC

## 2011-05-18 MED ORDER — CIPROFLOXACIN HCL 250 MG PO TABS
500.0000 mg | ORAL_TABLET | Freq: Once | ORAL | Status: AC
  Administered 2011-05-18: 500 mg via ORAL
  Filled 2011-05-18: qty 2

## 2011-05-18 MED ORDER — ONDANSETRON 4 MG PO TBDP
4.0000 mg | ORAL_TABLET | Freq: Once | ORAL | Status: AC
  Administered 2011-05-18: 4 mg via ORAL
  Filled 2011-05-18: qty 1

## 2011-05-18 MED ORDER — ONDANSETRON HCL 4 MG PO TABS: 4.0000 mg | ORAL_TABLET | Freq: Four times a day (QID) | ORAL | Status: AC

## 2011-05-18 NOTE — ED Notes (Signed)
Up to bathroom.  Diarrhea stool per patient (#7 since illness began).  Temp recheck  97.8  Shivering with chills.

## 2011-05-18 NOTE — ED Provider Notes (Signed)
History     CSN: [id number]  Arrival date & time 05/18/11  1655   None     Chief Complaint  Patient presents with  . Nausea  . Emesis  . Diarrhea  . Abdominal Pain    (Consider location/radiation/quality/duration/timing/severity/associated sxs/prior treatment) Patient is a 71 y.o. male presenting with vomiting. The history is provided by the patient (Patient complains of vomiting and diarrhea for a couple days. No blood in vomit or diarrhea. He says to get dialysis next on Monday.).  Emesis  This is a new problem. The current episode started 2 days ago. The problem occurs more than 10 times per day. The problem has not changed since onset.The emesis has an appearance of stomach contents. There has been no fever. Associated symptoms include diarrhea. Pertinent negatives include no abdominal pain, no chills, no cough, no fever, no headaches and no sweats. Risk factors include suspect food intake.    Past Medical History  Diagnosis Date  . Hypertension   . Renal disorder     Past Surgical History  Procedure Date  . Cholecystectomy   . Arteriovenous graft placement     No family history on file.  History  Substance Use Topics  . Smoking status: Never Smoker   . Smokeless tobacco: Not on file  . Alcohol Use: No      Review of Systems  Constitutional: Negative for fever, chills and fatigue.  HENT: Negative for congestion, sinus pressure and ear discharge.   Eyes: Negative for discharge.  Respiratory: Negative for cough.   Cardiovascular: Negative for chest pain.  Gastrointestinal: Positive for vomiting and diarrhea. Negative for abdominal pain.  Genitourinary: Negative for frequency and hematuria.  Musculoskeletal: Negative for back pain.  Skin: Negative for rash.  Neurological: Negative for seizures and headaches.  Hematological: Negative.   Psychiatric/Behavioral: Negative for hallucinations.    Allergies  Asa-ca carbonate  Home Medications  No current  outpatient prescriptions on file.  BP 114/63  Pulse 111  Temp(Src) 97.5 F (36.4 C) (Oral)  Resp 28  Ht 5\' 5"  (1.651 m)  Wt 198 lb (89.812 kg)  BMI 32.95 kg/m2  SpO2 100%  Physical Exam  Constitutional: He is oriented to person, place, and time. He appears well-developed.  HENT:  Head: Normocephalic and atraumatic.  Eyes: Conjunctivae and EOM are normal. No scleral icterus.  Neck: Neck supple. No thyromegaly present.  Cardiovascular: Normal rate and regular rhythm.  Exam reveals no gallop and no friction rub.   No murmur heard. Pulmonary/Chest: No stridor. He has no wheezes. He has no rales. He exhibits no tenderness.  Abdominal: He exhibits no distension. There is no tenderness. There is no rebound.  Musculoskeletal: Normal range of motion. He exhibits no edema.  Lymphadenopathy:    He has no cervical adenopathy.  Neurological: He is oriented to person, place, and time. Coordination normal.  Skin: No rash noted. No erythema.  Psychiatric: He has a normal mood and affect. His behavior is normal.    ED Course  Procedures (including critical care time)  Labs Reviewed - No data to display No results found.   1. Renal failure      Results for orders placed during the hospital encounter of 05/18/11  CBC      Component Value Range   WBC 18.4 (*) 4.0 - 10.5 (K/uL)   RBC 6.79 (*) 4.22 - 5.81 (MIL/uL)   Hemoglobin 14.9  13.0 - 17.0 (g/dL)   HCT 46.9  39.0 - 52.0 (%)  MCV 69.1 (*) 78.0 - 100.0 (fL)   MCH 21.9 (*) 26.0 - 34.0 (pg)   MCHC 31.8  30.0 - 36.0 (g/dL)   RDW 20.1 (*) 11.5 - 15.5 (%)   Platelets 255  150 - 400 (K/uL)  DIFFERENTIAL      Component Value Range   Neutrophils Relative 94 (*) 43 - 77 (%)   Neutro Abs 17.3 (*) 1.7 - 7.7 (K/uL)   Lymphocytes Relative 2 (*) 12 - 46 (%)   Lymphs Abs 0.3 (*) 0.7 - 4.0 (K/uL)   Monocytes Relative 4  3 - 12 (%)   Monocytes Absolute 0.7  0.1 - 1.0 (K/uL)   Eosinophils Relative 0  0 - 5 (%)   Eosinophils Absolute 0.1   0.0 - 0.7 (K/uL)   Basophils Relative 0  0 - 1 (%)   Basophils Absolute 0.0  0.0 - 0.1 (K/uL)  BASIC METABOLIC PANEL      Component Value Range   Sodium 138  135 - 145 (mEq/L)   Potassium 3.5  3.5 - 5.1 (mEq/L)   Chloride 101  96 - 112 (mEq/L)   CO2 22  19 - 32 (mEq/L)   Glucose, Bld 142 (*) 70 - 99 (mg/dL)   BUN 24 (*) 6 - 23 (mg/dL)   Creatinine, Ser 9.67 (*) 0.50 - 1.35 (mg/dL)   Calcium 10.0  8.4 - 10.5 (mg/dL)   GFR calc non Af Amer 5 (*) >90 (mL/min)   GFR calc Af Amer 6 (*) >90 (mL/min)   Dg Abd Acute W/chest  05/18/2011  *RADIOLOGY REPORT*  Clinical Data: Abdominal pain, nausea, vomiting.  ACUTE ABDOMEN SERIES (ABDOMEN 2 VIEW & CHEST 1 VIEW)  Comparison: 09/13/2010  Findings: There is hyperinflation of the lungs compatible with COPD.  Scarring in the lung bases.  Heart is normal size. Tortuosity of the thoracic aorta.  Prior cholecystectomy.  Mild gaseous distention of the stomach.  No evidence of obstruction or free air.  Extensive vascular calcifications in the anatomic pelvis.  No bony abnormality.  IMPRESSION: No obstruction or free air.  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  Original Report Authenticated By: Raelyn Number, M.D.     MDM  Gastroenteritis.  Pt will follow up monday        Joseph Diego, MD 05/20/11 1139

## 2011-05-18 NOTE — ED Provider Notes (Signed)
History   This chart was scribed for Joseph Diego, MD by Marin Comment . The patient was seen in room APA01/APA01 and the patient's care was started at 7:15pm.   CSN: [id number]  Arrival date & time 05/18/11  1655   First MD Initiated Contact with Patient 05/18/11 1912      Chief Complaint  Patient presents with  . Nausea  . Emesis  . Diarrhea  . Abdominal Pain    (Consider location/radiation/quality/duration/timing/severity/associated sxs/prior treatment) HPI CALLIN GRISE is a 71 y.o. male who presents to the Emergency Department complaining of intermittent, moderate emesis with associated nausea, diarrhea, chills, and abdominal pain that started about 3 hours ago. Patient reports that he has v/d several times since onset. Patient denies fever, sore throat, and leg pain. Patient notes he is a dialysis patient and Monday is his next treatment. Patient reports no sick contacts.     Past Medical History  Diagnosis Date  . Hypertension   . Renal disorder     Past Surgical History  Procedure Date  . Cholecystectomy   . Arteriovenous graft placement     No family history on file.  History  Substance Use Topics  . Smoking status: Never Smoker   . Smokeless tobacco: Not on file  . Alcohol Use: No      Review of Systems  Constitutional: Positive for fever and chills. Negative for fatigue.  HENT: Negative for congestion, sore throat, sinus pressure and ear discharge.   Eyes: Negative for discharge.  Respiratory: Negative for cough.   Cardiovascular: Negative for chest pain.  Gastrointestinal: Positive for nausea, vomiting, abdominal pain and diarrhea. Negative for blood in stool.  Genitourinary: Negative for frequency and hematuria.  Musculoskeletal: Negative for myalgias and back pain.  Skin: Negative for rash.  Neurological: Negative for seizures and headaches.  Hematological: Negative.   Psychiatric/Behavioral: Negative for hallucinations.     Allergies  Asa-ca carbonate  Home Medications  No current outpatient prescriptions on file.  BP 114/63  Pulse 111  Temp(Src) 97.5 F (36.4 C) (Oral)  Resp 28  Ht 5\' 5"  (1.651 m)  Wt 198 lb (89.812 kg)  BMI 32.95 kg/m2  SpO2 100%  Physical Exam  Constitutional: He is oriented to person, place, and time. He appears well-developed.  HENT:  Head: Normocephalic and atraumatic.  Eyes: Conjunctivae and EOM are normal. No scleral icterus.  Neck: Neck supple. No thyromegaly present.  Cardiovascular: Normal rate and regular rhythm.  Exam reveals no gallop and no friction rub.   No murmur heard. Pulmonary/Chest: No stridor. He has no wheezes. He has no rales. He exhibits no tenderness.  Abdominal: He exhibits no distension. There is no tenderness. There is no rebound.  Musculoskeletal: Normal range of motion. He exhibits no edema.       Dialysis graft on right forearm.   Lymphadenopathy:    He has no cervical adenopathy.  Neurological: He is oriented to person, place, and time. Coordination normal.  Skin: No rash noted. No erythema.  Psychiatric: He has a normal mood and affect. His behavior is normal.    ED Course  Procedures (including critical care time)   COORDINATION OF CARE:  1930: Medications Ordered: Ondansetron, 0.9% Sodium chloride 2100: Medications Ordered: Diphenoxylate-atropine 2.5-0.025 mg per tablet 2 tablet-once; ondansetron disintegrating tablet 4 mg  Results for orders placed during the hospital encounter of 05/18/11  CBC      Component Value Range   WBC 18.4 (*) 4.0 - 10.5 (  K/uL)   RBC 6.79 (*) 4.22 - 5.81 (MIL/uL)   Hemoglobin 14.9  13.0 - 17.0 (g/dL)   HCT 46.9  39.0 - 52.0 (%)   MCV 69.1 (*) 78.0 - 100.0 (fL)   MCH 21.9 (*) 26.0 - 34.0 (pg)   MCHC 31.8  30.0 - 36.0 (g/dL)   RDW 20.1 (*) 11.5 - 15.5 (%)   Platelets 255  150 - 400 (K/uL)  DIFFERENTIAL      Component Value Range   Neutrophils Relative 94 (*) 43 - 77 (%)   Neutro Abs 17.3 (*)  1.7 - 7.7 (K/uL)   Lymphocytes Relative 2 (*) 12 - 46 (%)   Lymphs Abs 0.3 (*) 0.7 - 4.0 (K/uL)   Monocytes Relative 4  3 - 12 (%)   Monocytes Absolute 0.7  0.1 - 1.0 (K/uL)   Eosinophils Relative 0  0 - 5 (%)   Eosinophils Absolute 0.1  0.0 - 0.7 (K/uL)   Basophils Relative 0  0 - 1 (%)   Basophils Absolute 0.0  0.0 - 0.1 (K/uL)  BASIC METABOLIC PANEL      Component Value Range   Sodium 138  135 - 145 (mEq/L)   Potassium 3.5  3.5 - 5.1 (mEq/L)   Chloride 101  96 - 112 (mEq/L)   CO2 22  19 - 32 (mEq/L)   Glucose, Bld 142 (*) 70 - 99 (mg/dL)   BUN 24 (*) 6 - 23 (mg/dL)   Creatinine, Ser 9.67 (*) 0.50 - 1.35 (mg/dL)   Calcium 10.0  8.4 - 10.5 (mg/dL)   GFR calc non Af Amer 5 (*) >90 (mL/min)   GFR calc Af Amer 6 (*) >90 (mL/min)   Dg Abd Acute W/chest  05/18/2011  *RADIOLOGY REPORT*  Clinical Data: Abdominal pain, nausea, vomiting.  ACUTE ABDOMEN SERIES (ABDOMEN 2 VIEW & CHEST 1 VIEW)  Comparison: 09/13/2010  Findings: There is hyperinflation of the lungs compatible with COPD.  Scarring in the lung bases.  Heart is normal size. Tortuosity of the thoracic aorta.  Prior cholecystectomy.  Mild gaseous distention of the stomach.  No evidence of obstruction or free air.  Extensive vascular calcifications in the anatomic pelvis.  No bony abnormality.  IMPRESSION: No obstruction or free air.  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  Original Report Authenticated By: Raelyn Number, M.D.      No diagnosis found.  Pt improved with tx  MDM  diarhea colitis,  The chart was scribed for me under my direct supervision.  I personally performed the history, physical, and medical decision making and all procedures in the evaluation of this patient.Joseph Diego, MD 05/18/11 2215

## 2011-05-18 NOTE — ED Notes (Signed)
Multiple attempts by nurses on first shift and 5 attemps by this shift to start IV without success.  Can only attempt on left arm - active shunt on right.

## 2011-05-18 NOTE — ED Notes (Signed)
MD aware of No IV start - advised to hold off for further attempts at this time.

## 2011-05-18 NOTE — ED Notes (Signed)
Up to bathroom three more times for diarrhea stools  -  Patient states it is coming out of him like water.

## 2011-05-18 NOTE — ED Notes (Signed)
Pt presents with abd pain, n/v/d since 1630 today.

## 2011-05-18 NOTE — ED Notes (Signed)
States his stomach is feeling much better - has not been to [facility] in past hour.

## 2011-05-18 NOTE — ED Notes (Signed)
Pt c/o severe abdominal pain, nausea,vomiting and diarrhea since 1630. Pt states that he has vomited x 6 and has had diarrhea x 5 since it started. Pt also having chills. Alert and oriented x 3. Skin warm and dry. Color pink. Breath sounds clear and equal bilaterally. Pt states that he had his gallbladder out 6 months ago.

## 2011-05-30 DIAGNOSIS — N186 End stage renal disease: Secondary | ICD-10-CM | POA: Diagnosis not present

## 2011-05-31 DIAGNOSIS — D509 Iron deficiency anemia, unspecified: Secondary | ICD-10-CM | POA: Diagnosis not present

## 2011-05-31 DIAGNOSIS — N186 End stage renal disease: Secondary | ICD-10-CM | POA: Diagnosis not present

## 2011-05-31 DIAGNOSIS — N2581 Secondary hyperparathyroidism of renal origin: Secondary | ICD-10-CM | POA: Diagnosis not present

## 2011-06-03 DIAGNOSIS — N186 End stage renal disease: Secondary | ICD-10-CM | POA: Diagnosis not present

## 2011-06-03 DIAGNOSIS — N2581 Secondary hyperparathyroidism of renal origin: Secondary | ICD-10-CM | POA: Diagnosis not present

## 2011-06-03 DIAGNOSIS — D509 Iron deficiency anemia, unspecified: Secondary | ICD-10-CM | POA: Diagnosis not present

## 2011-06-05 DIAGNOSIS — D509 Iron deficiency anemia, unspecified: Secondary | ICD-10-CM | POA: Diagnosis not present

## 2011-06-05 DIAGNOSIS — N186 End stage renal disease: Secondary | ICD-10-CM | POA: Diagnosis not present

## 2011-06-05 DIAGNOSIS — N2581 Secondary hyperparathyroidism of renal origin: Secondary | ICD-10-CM | POA: Diagnosis not present

## 2011-06-07 DIAGNOSIS — N186 End stage renal disease: Secondary | ICD-10-CM | POA: Diagnosis not present

## 2011-06-07 DIAGNOSIS — D509 Iron deficiency anemia, unspecified: Secondary | ICD-10-CM | POA: Diagnosis not present

## 2011-06-07 DIAGNOSIS — N2581 Secondary hyperparathyroidism of renal origin: Secondary | ICD-10-CM | POA: Diagnosis not present

## 2011-06-10 DIAGNOSIS — N2581 Secondary hyperparathyroidism of renal origin: Secondary | ICD-10-CM | POA: Diagnosis not present

## 2011-06-10 DIAGNOSIS — N186 End stage renal disease: Secondary | ICD-10-CM | POA: Diagnosis not present

## 2011-06-10 DIAGNOSIS — D509 Iron deficiency anemia, unspecified: Secondary | ICD-10-CM | POA: Diagnosis not present

## 2011-06-12 DIAGNOSIS — N186 End stage renal disease: Secondary | ICD-10-CM | POA: Diagnosis not present

## 2011-06-12 DIAGNOSIS — D509 Iron deficiency anemia, unspecified: Secondary | ICD-10-CM | POA: Diagnosis not present

## 2011-06-12 DIAGNOSIS — N2581 Secondary hyperparathyroidism of renal origin: Secondary | ICD-10-CM | POA: Diagnosis not present

## 2011-06-14 DIAGNOSIS — N2581 Secondary hyperparathyroidism of renal origin: Secondary | ICD-10-CM | POA: Diagnosis not present

## 2011-06-14 DIAGNOSIS — D509 Iron deficiency anemia, unspecified: Secondary | ICD-10-CM | POA: Diagnosis not present

## 2011-06-14 DIAGNOSIS — N186 End stage renal disease: Secondary | ICD-10-CM | POA: Diagnosis not present

## 2011-06-17 DIAGNOSIS — N186 End stage renal disease: Secondary | ICD-10-CM | POA: Diagnosis not present

## 2011-06-17 DIAGNOSIS — N2581 Secondary hyperparathyroidism of renal origin: Secondary | ICD-10-CM | POA: Diagnosis not present

## 2011-06-17 DIAGNOSIS — D509 Iron deficiency anemia, unspecified: Secondary | ICD-10-CM | POA: Diagnosis not present

## 2011-06-19 DIAGNOSIS — N2581 Secondary hyperparathyroidism of renal origin: Secondary | ICD-10-CM | POA: Diagnosis not present

## 2011-06-19 DIAGNOSIS — D509 Iron deficiency anemia, unspecified: Secondary | ICD-10-CM | POA: Diagnosis not present

## 2011-06-19 DIAGNOSIS — N186 End stage renal disease: Secondary | ICD-10-CM | POA: Diagnosis not present

## 2011-06-21 DIAGNOSIS — N186 End stage renal disease: Secondary | ICD-10-CM | POA: Diagnosis not present

## 2011-06-21 DIAGNOSIS — D509 Iron deficiency anemia, unspecified: Secondary | ICD-10-CM | POA: Diagnosis not present

## 2011-06-21 DIAGNOSIS — N2581 Secondary hyperparathyroidism of renal origin: Secondary | ICD-10-CM | POA: Diagnosis not present

## 2011-06-24 DIAGNOSIS — N186 End stage renal disease: Secondary | ICD-10-CM | POA: Diagnosis not present

## 2011-06-24 DIAGNOSIS — D509 Iron deficiency anemia, unspecified: Secondary | ICD-10-CM | POA: Diagnosis not present

## 2011-06-24 DIAGNOSIS — N2581 Secondary hyperparathyroidism of renal origin: Secondary | ICD-10-CM | POA: Diagnosis not present

## 2011-06-26 DIAGNOSIS — N2581 Secondary hyperparathyroidism of renal origin: Secondary | ICD-10-CM | POA: Diagnosis not present

## 2011-06-26 DIAGNOSIS — D509 Iron deficiency anemia, unspecified: Secondary | ICD-10-CM | POA: Diagnosis not present

## 2011-06-26 DIAGNOSIS — N186 End stage renal disease: Secondary | ICD-10-CM | POA: Diagnosis not present

## 2011-06-27 DIAGNOSIS — N186 End stage renal disease: Secondary | ICD-10-CM | POA: Diagnosis not present

## 2011-06-28 DIAGNOSIS — N2581 Secondary hyperparathyroidism of renal origin: Secondary | ICD-10-CM | POA: Diagnosis not present

## 2011-06-28 DIAGNOSIS — N186 End stage renal disease: Secondary | ICD-10-CM | POA: Diagnosis not present

## 2011-06-28 DIAGNOSIS — D509 Iron deficiency anemia, unspecified: Secondary | ICD-10-CM | POA: Diagnosis not present

## 2011-07-11 ENCOUNTER — Ambulatory Visit (HOSPITAL_COMMUNITY)
Admission: RE | Admit: 2011-07-11 | Discharge: 2011-07-11 | Disposition: A | Discharge status: Home / Self Care | Payer: Medicare Other | Source: Ambulatory Visit | Attending: Nephrology | Admitting: Nephrology

## 2011-07-11 ENCOUNTER — Other Ambulatory Visit (HOSPITAL_COMMUNITY): Payer: Self-pay | Admitting: Nephrology

## 2011-07-11 DIAGNOSIS — R059 Cough, unspecified: Secondary | ICD-10-CM | POA: Diagnosis not present

## 2011-07-11 DIAGNOSIS — R05 Cough: Secondary | ICD-10-CM | POA: Diagnosis not present

## 2011-07-16 DIAGNOSIS — L89899 Pressure ulcer of other site, unspecified stage: Secondary | ICD-10-CM | POA: Diagnosis not present

## 2011-07-16 DIAGNOSIS — L8990 Pressure ulcer of unspecified site, unspecified stage: Secondary | ICD-10-CM | POA: Diagnosis not present

## 2011-07-16 DIAGNOSIS — L84 Corns and callosities: Secondary | ICD-10-CM | POA: Diagnosis not present

## 2011-07-16 DIAGNOSIS — I779 Disorder of arteries and arterioles, unspecified: Secondary | ICD-10-CM | POA: Diagnosis not present

## 2011-07-16 DIAGNOSIS — M86679 Other chronic osteomyelitis, unspecified ankle and foot: Secondary | ICD-10-CM | POA: Diagnosis not present

## 2011-07-16 DIAGNOSIS — L97509 Non-pressure chronic ulcer of other part of unspecified foot with unspecified severity: Secondary | ICD-10-CM | POA: Diagnosis not present

## 2011-07-23 DIAGNOSIS — M86679 Other chronic osteomyelitis, unspecified ankle and foot: Secondary | ICD-10-CM | POA: Diagnosis not present

## 2011-07-23 DIAGNOSIS — I779 Disorder of arteries and arterioles, unspecified: Secondary | ICD-10-CM | POA: Diagnosis not present

## 2011-07-23 DIAGNOSIS — L97509 Non-pressure chronic ulcer of other part of unspecified foot with unspecified severity: Secondary | ICD-10-CM | POA: Diagnosis not present

## 2011-07-23 DIAGNOSIS — L8990 Pressure ulcer of unspecified site, unspecified stage: Secondary | ICD-10-CM | POA: Diagnosis not present

## 2011-07-23 DIAGNOSIS — L84 Corns and callosities: Secondary | ICD-10-CM | POA: Diagnosis not present

## 2011-07-23 DIAGNOSIS — L89899 Pressure ulcer of other site, unspecified stage: Secondary | ICD-10-CM | POA: Diagnosis not present

## 2011-07-28 DIAGNOSIS — N186 End stage renal disease: Secondary | ICD-10-CM | POA: Diagnosis not present

## 2011-07-29 DIAGNOSIS — D509 Iron deficiency anemia, unspecified: Secondary | ICD-10-CM | POA: Diagnosis not present

## 2011-07-29 DIAGNOSIS — N2581 Secondary hyperparathyroidism of renal origin: Secondary | ICD-10-CM | POA: Diagnosis not present

## 2011-07-29 DIAGNOSIS — N039 Chronic nephritic syndrome with unspecified morphologic changes: Secondary | ICD-10-CM | POA: Diagnosis not present

## 2011-07-29 DIAGNOSIS — N186 End stage renal disease: Secondary | ICD-10-CM | POA: Diagnosis not present

## 2011-07-31 DIAGNOSIS — N186 End stage renal disease: Secondary | ICD-10-CM | POA: Diagnosis not present

## 2011-07-31 DIAGNOSIS — D631 Anemia in chronic kidney disease: Secondary | ICD-10-CM | POA: Diagnosis not present

## 2011-07-31 DIAGNOSIS — D509 Iron deficiency anemia, unspecified: Secondary | ICD-10-CM | POA: Diagnosis not present

## 2011-07-31 DIAGNOSIS — N2581 Secondary hyperparathyroidism of renal origin: Secondary | ICD-10-CM | POA: Diagnosis not present

## 2011-08-02 DIAGNOSIS — D631 Anemia in chronic kidney disease: Secondary | ICD-10-CM | POA: Diagnosis not present

## 2011-08-02 DIAGNOSIS — N186 End stage renal disease: Secondary | ICD-10-CM | POA: Diagnosis not present

## 2011-08-02 DIAGNOSIS — D509 Iron deficiency anemia, unspecified: Secondary | ICD-10-CM | POA: Diagnosis not present

## 2011-08-02 DIAGNOSIS — N2581 Secondary hyperparathyroidism of renal origin: Secondary | ICD-10-CM | POA: Diagnosis not present

## 2011-08-05 DIAGNOSIS — D509 Iron deficiency anemia, unspecified: Secondary | ICD-10-CM | POA: Diagnosis not present

## 2011-08-05 DIAGNOSIS — N186 End stage renal disease: Secondary | ICD-10-CM | POA: Diagnosis not present

## 2011-08-05 DIAGNOSIS — N039 Chronic nephritic syndrome with unspecified morphologic changes: Secondary | ICD-10-CM | POA: Diagnosis not present

## 2011-08-05 DIAGNOSIS — N2581 Secondary hyperparathyroidism of renal origin: Secondary | ICD-10-CM | POA: Diagnosis not present

## 2011-08-05 DIAGNOSIS — D631 Anemia in chronic kidney disease: Secondary | ICD-10-CM | POA: Diagnosis not present

## 2011-08-06 DIAGNOSIS — L97509 Non-pressure chronic ulcer of other part of unspecified foot with unspecified severity: Secondary | ICD-10-CM | POA: Diagnosis not present

## 2011-08-06 DIAGNOSIS — L84 Corns and callosities: Secondary | ICD-10-CM | POA: Diagnosis not present

## 2011-08-06 DIAGNOSIS — S98139A Complete traumatic amputation of one unspecified lesser toe, initial encounter: Secondary | ICD-10-CM | POA: Diagnosis not present

## 2011-08-06 DIAGNOSIS — M86679 Other chronic osteomyelitis, unspecified ankle and foot: Secondary | ICD-10-CM | POA: Diagnosis not present

## 2011-08-07 DIAGNOSIS — N2581 Secondary hyperparathyroidism of renal origin: Secondary | ICD-10-CM | POA: Diagnosis not present

## 2011-08-07 DIAGNOSIS — D631 Anemia in chronic kidney disease: Secondary | ICD-10-CM | POA: Diagnosis not present

## 2011-08-07 DIAGNOSIS — D509 Iron deficiency anemia, unspecified: Secondary | ICD-10-CM | POA: Diagnosis not present

## 2011-08-07 DIAGNOSIS — N186 End stage renal disease: Secondary | ICD-10-CM | POA: Diagnosis not present

## 2011-08-09 DIAGNOSIS — N186 End stage renal disease: Secondary | ICD-10-CM | POA: Diagnosis not present

## 2011-08-09 DIAGNOSIS — D631 Anemia in chronic kidney disease: Secondary | ICD-10-CM | POA: Diagnosis not present

## 2011-08-09 DIAGNOSIS — D509 Iron deficiency anemia, unspecified: Secondary | ICD-10-CM | POA: Diagnosis not present

## 2011-08-09 DIAGNOSIS — N2581 Secondary hyperparathyroidism of renal origin: Secondary | ICD-10-CM | POA: Diagnosis not present

## 2011-08-12 DIAGNOSIS — N2581 Secondary hyperparathyroidism of renal origin: Secondary | ICD-10-CM | POA: Diagnosis not present

## 2011-08-12 DIAGNOSIS — D631 Anemia in chronic kidney disease: Secondary | ICD-10-CM | POA: Diagnosis not present

## 2011-08-12 DIAGNOSIS — D509 Iron deficiency anemia, unspecified: Secondary | ICD-10-CM | POA: Diagnosis not present

## 2011-08-12 DIAGNOSIS — N186 End stage renal disease: Secondary | ICD-10-CM | POA: Diagnosis not present

## 2011-08-14 DIAGNOSIS — D631 Anemia in chronic kidney disease: Secondary | ICD-10-CM | POA: Diagnosis not present

## 2011-08-14 DIAGNOSIS — N2581 Secondary hyperparathyroidism of renal origin: Secondary | ICD-10-CM | POA: Diagnosis not present

## 2011-08-14 DIAGNOSIS — D509 Iron deficiency anemia, unspecified: Secondary | ICD-10-CM | POA: Diagnosis not present

## 2011-08-14 DIAGNOSIS — N186 End stage renal disease: Secondary | ICD-10-CM | POA: Diagnosis not present

## 2011-08-16 DIAGNOSIS — N2581 Secondary hyperparathyroidism of renal origin: Secondary | ICD-10-CM | POA: Diagnosis not present

## 2011-08-16 DIAGNOSIS — N186 End stage renal disease: Secondary | ICD-10-CM | POA: Diagnosis not present

## 2011-08-16 DIAGNOSIS — D631 Anemia in chronic kidney disease: Secondary | ICD-10-CM | POA: Diagnosis not present

## 2011-08-16 DIAGNOSIS — N039 Chronic nephritic syndrome with unspecified morphologic changes: Secondary | ICD-10-CM | POA: Diagnosis not present

## 2011-08-16 DIAGNOSIS — D509 Iron deficiency anemia, unspecified: Secondary | ICD-10-CM | POA: Diagnosis not present

## 2011-08-19 DIAGNOSIS — D509 Iron deficiency anemia, unspecified: Secondary | ICD-10-CM | POA: Diagnosis not present

## 2011-08-19 DIAGNOSIS — D631 Anemia in chronic kidney disease: Secondary | ICD-10-CM | POA: Diagnosis not present

## 2011-08-19 DIAGNOSIS — N2581 Secondary hyperparathyroidism of renal origin: Secondary | ICD-10-CM | POA: Diagnosis not present

## 2011-08-19 DIAGNOSIS — N186 End stage renal disease: Secondary | ICD-10-CM | POA: Diagnosis not present

## 2011-08-21 DIAGNOSIS — N186 End stage renal disease: Secondary | ICD-10-CM | POA: Diagnosis not present

## 2011-08-21 DIAGNOSIS — N039 Chronic nephritic syndrome with unspecified morphologic changes: Secondary | ICD-10-CM | POA: Diagnosis not present

## 2011-08-21 DIAGNOSIS — N2581 Secondary hyperparathyroidism of renal origin: Secondary | ICD-10-CM | POA: Diagnosis not present

## 2011-08-21 DIAGNOSIS — D509 Iron deficiency anemia, unspecified: Secondary | ICD-10-CM | POA: Diagnosis not present

## 2011-08-23 DIAGNOSIS — N2581 Secondary hyperparathyroidism of renal origin: Secondary | ICD-10-CM | POA: Diagnosis not present

## 2011-08-23 DIAGNOSIS — N039 Chronic nephritic syndrome with unspecified morphologic changes: Secondary | ICD-10-CM | POA: Diagnosis not present

## 2011-08-23 DIAGNOSIS — N186 End stage renal disease: Secondary | ICD-10-CM | POA: Diagnosis not present

## 2011-08-23 DIAGNOSIS — D509 Iron deficiency anemia, unspecified: Secondary | ICD-10-CM | POA: Diagnosis not present

## 2011-08-26 DIAGNOSIS — N186 End stage renal disease: Secondary | ICD-10-CM | POA: Diagnosis not present

## 2011-08-26 DIAGNOSIS — N039 Chronic nephritic syndrome with unspecified morphologic changes: Secondary | ICD-10-CM | POA: Diagnosis not present

## 2011-08-26 DIAGNOSIS — N2581 Secondary hyperparathyroidism of renal origin: Secondary | ICD-10-CM | POA: Diagnosis not present

## 2011-08-26 DIAGNOSIS — D631 Anemia in chronic kidney disease: Secondary | ICD-10-CM | POA: Diagnosis not present

## 2011-08-26 DIAGNOSIS — D509 Iron deficiency anemia, unspecified: Secondary | ICD-10-CM | POA: Diagnosis not present

## 2011-08-27 DIAGNOSIS — N186 End stage renal disease: Secondary | ICD-10-CM | POA: Diagnosis not present

## 2011-08-28 DIAGNOSIS — N186 End stage renal disease: Secondary | ICD-10-CM | POA: Diagnosis not present

## 2011-08-28 DIAGNOSIS — N039 Chronic nephritic syndrome with unspecified morphologic changes: Secondary | ICD-10-CM | POA: Diagnosis not present

## 2011-08-28 DIAGNOSIS — N2581 Secondary hyperparathyroidism of renal origin: Secondary | ICD-10-CM | POA: Diagnosis not present

## 2011-08-28 DIAGNOSIS — D509 Iron deficiency anemia, unspecified: Secondary | ICD-10-CM | POA: Diagnosis not present

## 2011-08-28 DIAGNOSIS — D631 Anemia in chronic kidney disease: Secondary | ICD-10-CM | POA: Diagnosis not present

## 2011-08-29 DIAGNOSIS — N19 Unspecified kidney failure: Secondary | ICD-10-CM | POA: Diagnosis not present

## 2011-08-29 DIAGNOSIS — I10 Essential (primary) hypertension: Secondary | ICD-10-CM | POA: Diagnosis not present

## 2011-08-30 DIAGNOSIS — D509 Iron deficiency anemia, unspecified: Secondary | ICD-10-CM | POA: Diagnosis not present

## 2011-08-30 DIAGNOSIS — N186 End stage renal disease: Secondary | ICD-10-CM | POA: Diagnosis not present

## 2011-08-30 DIAGNOSIS — N039 Chronic nephritic syndrome with unspecified morphologic changes: Secondary | ICD-10-CM | POA: Diagnosis not present

## 2011-08-30 DIAGNOSIS — N2581 Secondary hyperparathyroidism of renal origin: Secondary | ICD-10-CM | POA: Diagnosis not present

## 2011-09-02 DIAGNOSIS — N039 Chronic nephritic syndrome with unspecified morphologic changes: Secondary | ICD-10-CM | POA: Diagnosis not present

## 2011-09-02 DIAGNOSIS — N2581 Secondary hyperparathyroidism of renal origin: Secondary | ICD-10-CM | POA: Diagnosis not present

## 2011-09-02 DIAGNOSIS — D509 Iron deficiency anemia, unspecified: Secondary | ICD-10-CM | POA: Diagnosis not present

## 2011-09-02 DIAGNOSIS — N186 End stage renal disease: Secondary | ICD-10-CM | POA: Diagnosis not present

## 2011-09-04 DIAGNOSIS — N2581 Secondary hyperparathyroidism of renal origin: Secondary | ICD-10-CM | POA: Diagnosis not present

## 2011-09-04 DIAGNOSIS — D631 Anemia in chronic kidney disease: Secondary | ICD-10-CM | POA: Diagnosis not present

## 2011-09-04 DIAGNOSIS — N186 End stage renal disease: Secondary | ICD-10-CM | POA: Diagnosis not present

## 2011-09-04 DIAGNOSIS — D509 Iron deficiency anemia, unspecified: Secondary | ICD-10-CM | POA: Diagnosis not present

## 2011-09-05 DIAGNOSIS — I10 Essential (primary) hypertension: Secondary | ICD-10-CM | POA: Diagnosis not present

## 2011-09-05 DIAGNOSIS — N19 Unspecified kidney failure: Secondary | ICD-10-CM | POA: Diagnosis not present

## 2011-09-06 DIAGNOSIS — D509 Iron deficiency anemia, unspecified: Secondary | ICD-10-CM | POA: Diagnosis not present

## 2011-09-06 DIAGNOSIS — N2581 Secondary hyperparathyroidism of renal origin: Secondary | ICD-10-CM | POA: Diagnosis not present

## 2011-09-06 DIAGNOSIS — N186 End stage renal disease: Secondary | ICD-10-CM | POA: Diagnosis not present

## 2011-09-06 DIAGNOSIS — D631 Anemia in chronic kidney disease: Secondary | ICD-10-CM | POA: Diagnosis not present

## 2011-09-09 DIAGNOSIS — D509 Iron deficiency anemia, unspecified: Secondary | ICD-10-CM | POA: Diagnosis not present

## 2011-09-09 DIAGNOSIS — D631 Anemia in chronic kidney disease: Secondary | ICD-10-CM | POA: Diagnosis not present

## 2011-09-09 DIAGNOSIS — N2581 Secondary hyperparathyroidism of renal origin: Secondary | ICD-10-CM | POA: Diagnosis not present

## 2011-09-09 DIAGNOSIS — N186 End stage renal disease: Secondary | ICD-10-CM | POA: Diagnosis not present

## 2011-09-11 DIAGNOSIS — N186 End stage renal disease: Secondary | ICD-10-CM | POA: Diagnosis not present

## 2011-09-11 DIAGNOSIS — D631 Anemia in chronic kidney disease: Secondary | ICD-10-CM | POA: Diagnosis not present

## 2011-09-11 DIAGNOSIS — D509 Iron deficiency anemia, unspecified: Secondary | ICD-10-CM | POA: Diagnosis not present

## 2011-09-11 DIAGNOSIS — N2581 Secondary hyperparathyroidism of renal origin: Secondary | ICD-10-CM | POA: Diagnosis not present

## 2011-09-13 ENCOUNTER — Other Ambulatory Visit: Payer: Self-pay

## 2011-09-13 DIAGNOSIS — N039 Chronic nephritic syndrome with unspecified morphologic changes: Secondary | ICD-10-CM | POA: Diagnosis not present

## 2011-09-13 DIAGNOSIS — D631 Anemia in chronic kidney disease: Secondary | ICD-10-CM | POA: Diagnosis not present

## 2011-09-13 DIAGNOSIS — T82898A Other specified complication of vascular prosthetic devices, implants and grafts, initial encounter: Secondary | ICD-10-CM

## 2011-09-13 DIAGNOSIS — N2581 Secondary hyperparathyroidism of renal origin: Secondary | ICD-10-CM | POA: Diagnosis not present

## 2011-09-13 DIAGNOSIS — D509 Iron deficiency anemia, unspecified: Secondary | ICD-10-CM | POA: Diagnosis not present

## 2011-09-13 DIAGNOSIS — N186 End stage renal disease: Secondary | ICD-10-CM | POA: Diagnosis not present

## 2011-09-16 DIAGNOSIS — D509 Iron deficiency anemia, unspecified: Secondary | ICD-10-CM | POA: Diagnosis not present

## 2011-09-16 DIAGNOSIS — N039 Chronic nephritic syndrome with unspecified morphologic changes: Secondary | ICD-10-CM | POA: Diagnosis not present

## 2011-09-16 DIAGNOSIS — N2581 Secondary hyperparathyroidism of renal origin: Secondary | ICD-10-CM | POA: Diagnosis not present

## 2011-09-16 DIAGNOSIS — N186 End stage renal disease: Secondary | ICD-10-CM | POA: Diagnosis not present

## 2011-09-18 DIAGNOSIS — D631 Anemia in chronic kidney disease: Secondary | ICD-10-CM | POA: Diagnosis not present

## 2011-09-18 DIAGNOSIS — N186 End stage renal disease: Secondary | ICD-10-CM | POA: Diagnosis not present

## 2011-09-18 DIAGNOSIS — D509 Iron deficiency anemia, unspecified: Secondary | ICD-10-CM | POA: Diagnosis not present

## 2011-09-18 DIAGNOSIS — N2581 Secondary hyperparathyroidism of renal origin: Secondary | ICD-10-CM | POA: Diagnosis not present

## 2011-09-19 ENCOUNTER — Encounter (HOSPITAL_COMMUNITY): Payer: Self-pay | Admitting: Emergency Medicine

## 2011-09-19 ENCOUNTER — Inpatient Hospital Stay (HOSPITAL_COMMUNITY)
Admission: EM | Admit: 2011-09-19 | Discharge: 2011-09-23 | DRG: 377 | Disposition: A | Discharge status: Home / Self Care | Payer: Medicare Other | Attending: Internal Medicine | Admitting: Internal Medicine

## 2011-09-19 DIAGNOSIS — I120 Hypertensive chronic kidney disease with stage 5 chronic kidney disease or end stage renal disease: Secondary | ICD-10-CM | POA: Diagnosis not present

## 2011-09-19 DIAGNOSIS — I10 Essential (primary) hypertension: Secondary | ICD-10-CM | POA: Diagnosis not present

## 2011-09-19 DIAGNOSIS — K921 Melena: Secondary | ICD-10-CM | POA: Diagnosis present

## 2011-09-19 DIAGNOSIS — Z992 Dependence on renal dialysis: Secondary | ICD-10-CM | POA: Diagnosis not present

## 2011-09-19 DIAGNOSIS — K625 Hemorrhage of anus and rectum: Principal | ICD-10-CM | POA: Diagnosis present

## 2011-09-19 DIAGNOSIS — N186 End stage renal disease: Secondary | ICD-10-CM | POA: Diagnosis present

## 2011-09-19 DIAGNOSIS — D500 Iron deficiency anemia secondary to blood loss (chronic): Secondary | ICD-10-CM | POA: Diagnosis present

## 2011-09-19 DIAGNOSIS — G589 Mononeuropathy, unspecified: Secondary | ICD-10-CM | POA: Diagnosis not present

## 2011-09-19 DIAGNOSIS — K2960 Other gastritis without bleeding: Secondary | ICD-10-CM | POA: Diagnosis present

## 2011-09-19 DIAGNOSIS — Z8673 Personal history of transient ischemic attack (TIA), and cerebral infarction without residual deficits: Secondary | ICD-10-CM

## 2011-09-19 DIAGNOSIS — E119 Type 2 diabetes mellitus without complications: Secondary | ICD-10-CM | POA: Diagnosis not present

## 2011-09-19 DIAGNOSIS — D1803 Hemangioma of intra-abdominal structures: Secondary | ICD-10-CM | POA: Diagnosis present

## 2011-09-19 DIAGNOSIS — K922 Gastrointestinal hemorrhage, unspecified: Secondary | ICD-10-CM | POA: Diagnosis not present

## 2011-09-19 DIAGNOSIS — R5383 Other fatigue: Secondary | ICD-10-CM | POA: Diagnosis not present

## 2011-09-19 DIAGNOSIS — R109 Unspecified abdominal pain: Secondary | ICD-10-CM | POA: Diagnosis not present

## 2011-09-19 DIAGNOSIS — K449 Diaphragmatic hernia without obstruction or gangrene: Secondary | ICD-10-CM | POA: Diagnosis not present

## 2011-09-19 DIAGNOSIS — I739 Peripheral vascular disease, unspecified: Secondary | ICD-10-CM | POA: Diagnosis not present

## 2011-09-19 HISTORY — DX: End stage renal disease: N18.6

## 2011-09-19 HISTORY — DX: Dependence on renal dialysis: Z99.2

## 2011-09-19 HISTORY — DX: Cerebral infarction, unspecified: I63.9

## 2011-09-19 LAB — OCCULT BLOOD, POC DEVICE: Fecal Occult Bld: POSITIVE

## 2011-09-19 LAB — TYPE AND SCREEN
ABO/RH(D): A POS
Antibody Screen: NEGATIVE

## 2011-09-19 LAB — DIFFERENTIAL
Basophils Relative: 0 % (ref 0–1)
Eosinophils Absolute: 0.4 10*3/uL (ref 0.0–0.7)
Monocytes Relative: 6 % (ref 3–12)
Neutrophils Relative %: 62 % (ref 43–77)

## 2011-09-19 LAB — CBC
Hemoglobin: 13.2 g/dL (ref 13.0–17.0)
MCH: 22 pg — ABNORMAL LOW (ref 26.0–34.0)
MCHC: 32 g/dL (ref 30.0–36.0)
MCHC: 32.5 g/dL (ref 30.0–36.0)
Platelets: 304 10*3/uL (ref 150–400)
RDW: 19.3 % — ABNORMAL HIGH (ref 11.5–15.5)
WBC: 13.9 10*3/uL — ABNORMAL HIGH (ref 4.0–10.5)

## 2011-09-19 LAB — BASIC METABOLIC PANEL
BUN: 54 mg/dL — ABNORMAL HIGH (ref 6–23)
CO2: 27 mEq/L (ref 19–32)
Chloride: 96 mEq/L (ref 96–112)
Creatinine, Ser: 9.79 mg/dL — ABNORMAL HIGH (ref 0.50–1.35)
GFR calc Af Amer: 6 mL/min — ABNORMAL LOW (ref >90)
Glucose, Bld: 86 mg/dL (ref 70–99)

## 2011-09-19 LAB — APTT: aPTT: 31 seconds (ref 24–37)

## 2011-09-19 MED ORDER — AMLODIPINE BESYLATE 5 MG PO TABS
5.0000 mg | ORAL_TABLET | Freq: Every day | ORAL | Status: DC
  Filled 2011-09-19 (×2): qty 1

## 2011-09-19 MED ORDER — GABAPENTIN 300 MG PO CAPS
600.0000 mg | ORAL_CAPSULE | Freq: Every day | ORAL | Status: DC
  Administered 2011-09-19 – 2011-09-22 (×4): 600 mg via ORAL
  Filled 2011-09-19 (×4): qty 2

## 2011-09-19 MED ORDER — METOPROLOL SUCCINATE ER 50 MG PO TB24
50.0000 mg | ORAL_TABLET | Freq: Every day | ORAL | Status: DC
  Administered 2011-09-22 – 2011-09-23 (×2): 50 mg via ORAL
  Filled 2011-09-19 (×4): qty 1

## 2011-09-19 MED ORDER — ONDANSETRON HCL 4 MG/2ML IJ SOLN
4.0000 mg | Freq: Once | INTRAMUSCULAR | Status: AC
  Administered 2011-09-19: 4 mg via INTRAVENOUS
  Filled 2011-09-19: qty 2

## 2011-09-19 MED ORDER — PANTOPRAZOLE SODIUM 40 MG IV SOLR
40.0000 mg | Freq: Once | INTRAVENOUS | Status: AC
  Administered 2011-09-19: 40 mg via INTRAVENOUS
  Filled 2011-09-19: qty 40

## 2011-09-19 MED ORDER — SODIUM CHLORIDE 0.9 % IV BOLUS (SEPSIS)
500.0000 mL | Freq: Once | INTRAVENOUS | Status: AC
  Administered 2011-09-19: 11:00:00 via INTRAVENOUS

## 2011-09-19 MED ORDER — SODIUM CHLORIDE 0.9 % IV SOLN: INTRAVENOUS | Status: AC

## 2011-09-19 MED ORDER — ONDANSETRON HCL 4 MG PO TABS: 4.0000 mg | ORAL_TABLET | Freq: Four times a day (QID) | ORAL | Status: DC | PRN

## 2011-09-19 MED ORDER — CINACALCET HCL 30 MG PO TABS
60.0000 mg | ORAL_TABLET | Freq: Every day | ORAL | Status: DC
  Administered 2011-09-19 – 2011-09-23 (×5): 60 mg via ORAL
  Filled 2011-09-19 (×7): qty 2

## 2011-09-19 MED ORDER — HYDROXYZINE HCL 10 MG PO TABS
10.0000 mg | ORAL_TABLET | Freq: Every evening | ORAL | Status: DC | PRN
  Filled 2011-09-19: qty 1

## 2011-09-19 MED ORDER — PANTOPRAZOLE SODIUM 40 MG IV SOLR
40.0000 mg | INTRAVENOUS | Status: DC
  Administered 2011-09-19 – 2011-09-22 (×3): 40 mg via INTRAVENOUS
  Filled 2011-09-19 (×3): qty 40

## 2011-09-19 MED ORDER — ALPRAZOLAM 0.25 MG PO TABS
0.2500 mg | ORAL_TABLET | Freq: Three times a day (TID) | ORAL | Status: DC
  Administered 2011-09-19 – 2011-09-23 (×9): 0.25 mg via ORAL
  Filled 2011-09-19 (×9): qty 1

## 2011-09-19 MED ORDER — NEPHRO-VITE 0.8 MG PO TABS
1.0000 | ORAL_TABLET | Freq: Every day | ORAL | Status: DC
  Administered 2011-09-19 – 2011-09-23 (×4): 1 via ORAL
  Filled 2011-09-19 (×5): qty 1

## 2011-09-19 NOTE — H&P (Signed)
NAMEHERSCHEL, [name]                   ACCOUNT NO.:  [id number]  MEDICAL RECORD NO.:  [id number]  LOCATION:  [id number]                          FACILITY:  [facility]  PHYSICIAN:  Jacqulyne Gladue D. Legrand Rams, MD   DATE OF BIRTH:  11-15-1940  DATE OF ADMISSION:  09/19/2011 DATE OF DISCHARGE:  LH                             HISTORY & PHYSICAL   CHIEF COMPLAINT:  Rectal bleeding.  HISTORY OF PRESENT ILLNESS:  This is a 71 year old male patient with history of multiple medical illnesses, who is currently on hemodialysis, came to my office with complaint of dark tarry stools.  He had frequent dark stool and some blood in it.  He had the symptoms since last night. It was watery and has no pain.  The patient was seen in the office and stool occult test was positive and he had melena during the examination. The patient was sent to emergency room where he was further evaluated and was admitted for further treatment.  The patient is hemodynamically stable and his hemoglobin has not dropped.  However, due to his multiple medical illnesses and risk factor, the patient is being admitted for further evaluation.  REVIEW OF SYSTEMS:  No nausea vomiting, abdominal pain, dysuria, urgency, or frequency of urination.  No headache, fever, chills, cough, shortness of breath, or chest pain.  PAST MEDICAL HISTORY: 1. Renal failure, on hemodialysis. 2. Hypertension. 3. History of CVA. 4. History of previous GI bleed. 5. Neuropathy. 6. Cavernous hemangioma of the liver.  CURRENT MEDICATIONS: 1. Lomotil 1 tablet p.o. as needed. 2. Xanax 0.25 mg t.i.d. 3. Norvasc 5 mg daily. 4. Nephro-Vite 1 tablet p.o. daily. 5. Sensipar 60 mg daily. 6. Plavix 75 mg daily. 7. Neurontin 600 mg at bedtime. 8. Hydroxyzine 10 mg at bedtime. 9. Lidocaine cream apply as needed. 10.Metoprolol succinate 50 mg daily. 11.Zofran 4 mg q.6 p.r.n. 12.Zovirax 800 mg 3 tablets b.i.d. with meals.  SOCIAL HISTORY:  The patient has no history of alcohol,  tobacco, or substance abuse.  FAMILY HISTORY:  Not available at this time.  PHYSICAL EXAMINATION:  GENERAL:  The patient is alert, awake, and chronically sick looking. VITAL SIGNS:  Blood pressure 190/64, pulse 105, respiratory rate 18, temperature 98 degrees Fahrenheit. HEENT:  Pupils are equal, reactive. NECK:  Supple. CHEST:  Decreased air entry, few rhonchi. CARDIOVASCULAR SYSTEM:  First and second heart sounds heard.  No murmur. No gallop. ABDOMEN:  Soft, and lax.  Bowel sound is positive.  No mass or organomegaly. EXTREMITIES:  No leg edema.  LABS:  CBC:  WBC 13.3, hemoglobin 13.2, hematocrit 41.3, and platelets 309.  BMP:  Sodium 136, potassium 5.0, chloride 96, carbon dioxide 27, glucose 86, BUN 54, creatinine 9.79, calcium 9.8.  ASSESSMENT: 1. Rectal bleed, etiology not clear at this time. 2. End-stage renal failure, on hemodialysis. 3. Hypertension. 4. History of cerebrovascular accident.  PLAN:  We will admit the patient and keep on clear liquid diet.  We will start on Protonix.  We will continue to monitor his CBC.  We will do a GI and Nephrology consult.     Jadea Shiffer D. Legrand Rams, MD     TDF/MEDQ  D:  09/19/2011  T:  09/19/2011  Job:  [id number]

## 2011-09-19 NOTE — Plan of Care (Signed)
Problem: Phase I Progression Outcomes Goal: Initial discharge plan identified Outcome: Adequate for Discharge Will go home with family

## 2011-09-19 NOTE — ED Provider Notes (Signed)
History   This chart was scribed for Nat Christen, MD by Carolyne Littles. The patient was seen in room APA14/APA14. Patient's care was started at 0933.    CSN: [id number]  Arrival date & time 09/19/11  [id number]   First MD Initiated Contact with Patient 09/19/11 1007      Chief Complaint  Patient presents with  . Rectal Bleeding    (Consider location/radiation/quality/duration/timing/severity/associated sxs/prior treatment) HPI [name] is a 71 y.o. male who presents to the Emergency Department complaining of melena onset yesterday with associated lower abdominal pain, decreased appetite and weakness and persistent since. Patient reports previously being diagnosed with ulcer. Patient is currently using blood thinner but is unable to provide name of medication. Denies fever, chills, nausea, vomiting, hematuria, dysuria. Patient with a h/o HTN, renal disorder currently on dialysis (scheduled Monday, Wednesday, Friday), cholecystectomy.   Past Medical History  Diagnosis Date  . Hypertension   . Renal disorder     Past Surgical History  Procedure Date  . Cholecystectomy   . Arteriovenous graft placement     History reviewed. No pertinent family history.  History  Substance Use Topics  . Smoking status: Never Smoker   . Smokeless tobacco: Not on file  . Alcohol Use: No      Review of Systems A complete 10 system review of systems was obtained and all systems are negative except as noted in the HPI and PMH.   Allergies  Asa-ca carbonate  Home Medications  No current outpatient prescriptions on file.  BP 90/64  Pulse 105  Temp(Src) 98.7 F (37.1 C) (Oral)  Resp 18  Ht 5\' 6"  (1.676 m)  Wt 180 lb (81.647 kg)  BMI 29.05 kg/m2  SpO2 98%  Physical Exam  Nursing note and vitals reviewed. Constitutional: He is oriented to person, place, and time. He appears well-developed and well-nourished. No distress.  HENT:  Head: Normocephalic and atraumatic.  Eyes: EOM are normal.  Pupils are equal, round, and reactive to light.  Neck: Neck supple. No tracheal deviation present.  Cardiovascular: Normal rate.   Pulmonary/Chest: Effort normal. No respiratory distress.  Abdominal: Soft. He exhibits no distension.  Genitourinary: Guaiac positive stool.       No masses on rectal exam. Black stool color.   Musculoskeletal: Normal range of motion. He exhibits no edema.  Neurological: He is alert and oriented to person, place, and time. No sensory deficit.  Skin: Skin is warm and dry.  Psychiatric: He has a normal mood and affect. His behavior is normal.    ED Course  Procedures (including critical care time)  DIAGNOSTIC STUDIES: Oxygen Saturation is 98% on room air, normal by my interpretation.    COORDINATION OF CARE: 10:27AM- Rectal exam performed. Patient informed of current plan for treatment and evaluation and agrees with plan at this time. Explained probable admission.    Labs Reviewed  CBC - Abnormal; Notable for the following:    WBC 13.3 (*)    RBC 6.00 (*)    MCV 68.8 (*)    MCH 22.0 (*)    RDW 19.5 (*)    All other components within normal limits  DIFFERENTIAL - Abnormal; Notable for the following:    Neutro Abs 8.3 (*)    All other components within normal limits  BASIC METABOLIC PANEL - Abnormal; Notable for the following:    BUN 54 (*)    Creatinine, Ser 9.79 (*)    GFR calc non Af Amer 5 (*)  GFR calc Af Amer 6 (*)    All other components within normal limits  PROTIME-INR  APTT  OCCULT BLOOD, POC DEVICE  TYPE AND SCREEN   No results found.   No diagnosis found.  CRITICAL CARE Performed by: Nat Christen  ?  Total critical care time: 30  Critical care time was exclusive of separately billable procedures and treating other patients.  Critical care was necessary to treat or prevent imminent or life-threatening deterioration.  Critical care was time spent personally by me on the following activities: development of treatment plan  with patient and/or surrogate as well as nursing, discussions with consultants, evaluation of patient's response to treatment, examination of patient, obtaining history from patient or surrogate, ordering and performing treatments and interventions, ordering and review of laboratory studies, ordering and review of radiographic studies, pulse oximetry and re-evaluation of patient's condition.  MDM  Patient has end-stage renal disease. complain of black stool.  Rectal heme positive. Blood pressure has been low. IV Protonix started. Admit.      I personally performed the services described in this documentation, which was scribed in my presence. The recorded information has been reviewed and considered.    Nat Christen, MD 09/19/11 [phone]

## 2011-09-19 NOTE — ED Notes (Signed)
Pt c/o abd pain and dark stools since yesterday.

## 2011-09-19 NOTE — ED Notes (Signed)
Rectal exam performed by Dr. Lacinda Axon. Positive heme occult

## 2011-09-20 ENCOUNTER — Inpatient Hospital Stay (HOSPITAL_COMMUNITY): Payer: Medicare Other

## 2011-09-20 ENCOUNTER — Encounter (HOSPITAL_COMMUNITY): Payer: Self-pay | Admitting: Gastroenterology

## 2011-09-20 ENCOUNTER — Encounter: Payer: Self-pay | Admitting: Vascular Surgery

## 2011-09-20 DIAGNOSIS — K922 Gastrointestinal hemorrhage, unspecified: Secondary | ICD-10-CM

## 2011-09-20 LAB — BASIC METABOLIC PANEL
Chloride: 97 mEq/L (ref 96–112)
GFR calc non Af Amer: 4 mL/min — ABNORMAL LOW (ref >90)
Glucose, Bld: 86 mg/dL (ref 70–99)
Potassium: 5.4 mEq/L — ABNORMAL HIGH (ref 3.5–5.1)
Sodium: 134 mEq/L — ABNORMAL LOW (ref 135–145)

## 2011-09-20 LAB — CBC
HCT: 37.1 % — ABNORMAL LOW (ref 39.0–52.0)
Hemoglobin: 12 g/dL — ABNORMAL LOW (ref 13.0–17.0)
MCHC: 32.3 g/dL (ref 30.0–36.0)
RBC: 5.44 MIL/uL (ref 4.22–5.81)
WBC: 10.8 10*3/uL — ABNORMAL HIGH (ref 4.0–10.5)

## 2011-09-20 MED ORDER — SODIUM CHLORIDE 0.45 % IV SOLN
Freq: Once | INTRAVENOUS | Status: AC
  Administered 2011-09-21: 20 mL via INTRAVENOUS

## 2011-09-20 NOTE — Consult Note (Signed)
Referring Provider: [name], MD Primary Care Physician:  [name], MD, MD Primary Gastroenterologist:  Garfield Cornea, MD  Reason for Consultation:  Melena, heme positive stool  HPI: [name] is a 71 y.o. male admitted yesterday evening with complaints of two day h/o black tarry stools. Seen by Dr. Legrand Rams yesterday, DRE confirmed melena/heme positive stool. Past sent to ED for evaluation. Patient states symptoms began two days ago. One episode of periumbilical abdominal pain which has resolved. No n/v. No rectal bleeding. Patient has h/o gastric ulcers in 2004, s/p H.Pylori treatment. See below for details. Last TCS prior to 2004, cecal polyp, path unknown. Denies dysphagia, heartburn. Denies NSAIDS/ASA. Recently took something OTC for toothache, believes it was Tylenol. Patient is on Plavix.  Hemoglobin in ED was 13.2. Down to 78 today. Last melena, small amount this morning. INR 1. PTT 31. Platelets 304,000.   Prior to Admission medications   Medication Sig Start Date End Date Taking? Authorizing Provider  ALPRAZolam (XANAX) 0.25 MG tablet Take 0.25 mg by mouth 3 (three) times daily.   Yes Historical Provider, MD  amLODipine (NORVASC) 5 MG tablet Take 5 mg by mouth daily.   Yes Historical Provider, MD  b complex-vitamin c-folic acid (NEPHRO-VITE) 0.8 MG TABS Take 0.8 mg by mouth daily.   Yes Historical Provider, MD  cinacalcet (SENSIPAR) 60 MG tablet Take 60 mg by mouth daily. With evening meal   Yes Historical Provider, MD  clopidogrel (PLAVIX) 75 MG tablet Take 75 mg by mouth daily.   Yes Historical Provider, MD  diphenoxylate-atropine (LOMOTIL) 2.5-0.025 MG per tablet Take 1 tablet by mouth as needed. diarrhea   Yes Historical Provider, MD  gabapentin (NEURONTIN) 600 MG tablet Take 600 mg by mouth at bedtime.   Yes Historical Provider, MD  hydrOXYzine (ATARAX/VISTARIL) 10 MG tablet Take 10 mg by mouth at bedtime as needed. itching   Yes Historical Provider, MD    lidocaine-prilocaine (EMLA) cream Apply 1 application topically as needed. Apply to fistula 1 hour  Before treatment   Yes Historical Provider, MD  metoprolol succinate (TOPROL-XL) 50 MG 24 hr tablet Take 50 mg by mouth daily. Take with or immediately following a meal.   Yes Historical Provider, MD  ondansetron (ZOFRAN) 4 MG tablet Take 4 mg by mouth every 6 (six) hours as needed. nausea   Yes Historical Provider, MD  sevelamer (RENVELA) 800 MG tablet Take 2,400-4,000 mg by mouth 3 (three) times daily with meals. Patient takes 5 tablets with meals and 3 tablets with snacks   Yes Historical Provider, MD    Current Facility-Administered Medications  Medication Dose Route Frequency Provider Last Rate Last Dose  . 0.9 %  sodium chloride infusion   Intravenous STAT Nat Christen, MD      . ALPRAZolam Duanne Moron) tablet 0.25 mg  0.25 mg Oral TID [name], MD   0.25 mg at 09/19/11 2219  . amLODipine (NORVASC) tablet 5 mg  5 mg Oral Daily [name], MD      . b complex-vitamin c-folic acid (NEPHRO-VITE) tablet 1 tablet  1 tablet Oral Daily [name], MD   1 tablet at 09/19/11 2007  . cinacalcet (SENSIPAR) tablet 60 mg  60 mg Oral Daily [name], MD   60 mg at 09/20/11 0900  . gabapentin (NEURONTIN) capsule 600 mg  600 mg Oral QHS [name], MD   600 mg at 09/19/11 2219  . hydrOXYzine (ATARAX/VISTARIL) tablet 10 mg  10 mg Oral QHS PRN [name],  MD      . metoprolol succinate (TOPROL-XL) 24 hr tablet 50 mg  50 mg Oral Daily [name], MD      . ondansetron (ZOFRAN) injection 4 mg  4 mg Intravenous Once Nat Christen, MD   4 mg at 09/19/11 1103  . ondansetron (ZOFRAN) tablet 4 mg  4 mg Oral Q6H PRN [name], MD      . pantoprazole (PROTONIX) injection 40 mg  40 mg Intravenous Once Nat Christen, MD   40 mg at 09/19/11 1103  . pantoprazole (PROTONIX) injection 40 mg  40 mg Intravenous Q24H [name], MD   40 mg at 09/19/11 2006  . sodium chloride 0.9 % bolus 500 mL  500 mL  Intravenous Once Nat Christen, MD        Allergies as of 09/19/2011 - Review Complete 09/19/2011  Allergen Reaction Noted  . Asa-ca carbonate (aspirin-calcium carbonate) Other (See Comments) 07/13/2010    Past Medical History  Diagnosis Date  . Hypertension   . ESRD (end stage renal disease) on dialysis   . CVA (cerebral infarction)   . Gastric ulcer 2004    treated for h.pylori see PSH  . LVH (left ventricular hypertrophy)     Past Surgical History  Procedure Date  . Cholecystectomy   . Arteriovenous graft placement   . Shoulder surgery   . Colonoscopy 2004    Dr. Irving Shows, left sided diverticula and cecal polyp, path unknown  . Esophagogastroduodenoscopy 11/2002    Dr. Gala Romney, erosive reflux esophagitis, multiple gastric ulcer and antral/bulbar erosions. Serologies positive for H.Pylori and was treated  . Esophagogastroduodenoscopy 11/20014    Dr. Gala Romney, small hh only, ulcers healed    Family History  Problem Relation Age of Onset  . Colon cancer Neg Hx   . Liver disease Neg Hx     History   Social History  . Marital Status: Single    Spouse Name: N/A    Number of Children: 2  . Years of Education: N/A   Occupational History  .     Social History Main Topics  . Smoking status: Former Research scientist (life sciences)  . Smokeless tobacco: Not on file   Comment: quit 2006  . Alcohol Use: No  . Drug Use: No  . Sexually Active: Not on file   Other Topics Concern  . Not on file   Social History Narrative  . No narrative on file     ROS:  General: Negative for anorexia, weight loss, fever, chills, fatigue, weakness. Eyes: Negative for vision changes.  ENT: Negative for hoarseness, difficulty swallowing, nasal congestion. CV: Negative for chest pain, angina, palpitations, dyspnea on exertion, peripheral edema.  Respiratory: Negative for dyspnea at rest, dyspnea on exertion, cough, sputum, wheezing.  GI: See history of present illness. GU:  Negative for dysuria, hematuria,  urinary incontinence, urinary frequency, nocturnal urination.  MS: Negative for joint pain, low back pain.  Derm: Negative for rash or itching.  Neuro: Negative for weakness, abnormal sensation, seizure, frequent headaches, memory loss, confusion.  Psych: Negative for anxiety, depression, suicidal ideation, hallucinations.  Endo: Negative for unusual weight change.  Heme: Negative for bruising or bleeding. Allergy: Negative for rash or hives.       Physical Examination: Vital signs in last 24 hours: Temp:  [97.4 F (36.3 C)-98 F (36.7 C)] 97.4 F (36.3 C) (05/24 0544) Pulse Rate:  [72-84] 78  (05/24 0544) Resp:  [18] 18  (05/24 0544) BP: (94-126)/(57-75) 102/67 mmHg (05/24 0544)  SpO2:  [95 %-99 %] 95 % (05/24 0544) Last BM Date: 09/19/11  General: Chronically ill appearing, well-developed in no acute distress.  Head: Normocephalic, atraumatic.   Eyes: Conjunctiva pink, no icterus. Mouth: Oropharyngeal mucosa moist and pink , no lesions erythema or exudate. Neck: Supple without thyromegaly, masses, or lymphadenopathy.  Lungs: Clear to auscultation bilaterally.  Heart: Regular rate and rhythm, no murmurs rubs or gallops.  Abdomen: Bowel sounds are normal, nontender, nondistended, no hepatosplenomegaly or masses, no abdominal bruits or    hernia , no rebound or guarding.   Rectal: not performed Extremities: No lower extremity edema, clubbing, deformity.  Neuro: Alert and oriented x 4 , grossly normal neurologically.  Skin: Warm and dry, no rash or jaundice.   Psych: Alert and cooperative, normal mood and affect.        Intake/Output from previous day: 05/23 0701 - 05/24 0700 In: 470 [P.O.:460; IV Piggyback:10] Out: -  Intake/Output this shift:    Lab Results: CBC  Basename 09/20/11 0541 09/19/11 1610 09/19/11 1036  WBC 10.8* 13.9* 13.3*  HGB 12.0* 13.1 13.2  HCT 37.1* 40.3 41.3  MCV 68.2* 68.8* 68.8*  PLT 304 304 309   BMET  Basename 09/20/11 0541 09/19/11 1036    NA 134* 136  K 5.4* 5.0  CL 97 96  CO2 22 27  GLUCOSE 86 86  BUN 60* 54*  CREATININE 11.31* 9.79*  CALCIUM 9.3 9.8   PT/INR  Basename 09/19/11 1040  LABPROT 13.4  INR 1.00      Imaging Studies: No results found.[name].Brome week]   Impression: 71 y/o AA male on hemodialysis for ESRD presents with acute onset melena of 48 hours duration. No other significant GI symptoms. Hemoglobin down 1 gram/dL. Several episodes of melena, likely due to UGI bleed. DDx includes PUD, gastritis, ERE, AVMs, malignancy.   Plan: 1. IV PPI. 2. EGD tomorrow given hyperkalemia. Patient scheduled for hemodialysis this afternoon.  I would like to thank Dr. Legrand Rams for allowing [place] to take part in the care of this nice patient.     LOS: 1 day   Neil Crouch  09/20/2011, 10:29 AM Patient seen and examined. Agree with above plan. The risks, benefits, limitations, alternatives and imponderables have been reviewed with the patient. Potential for esophageal dilation, biopsy, etc. have also been reviewed.  Questions have been answered. All parties agreeable.

## 2011-09-20 NOTE — Progress Notes (Signed)
UR Chart Review Completed  

## 2011-09-20 NOTE — Progress Notes (Signed)
NAMEMARION, [name]                   ACCOUNT NO.:  [id number]  MEDICAL RECORD NO.:  [id number]  LOCATION:  [id number]                          FACILITY:  [facility]  PHYSICIAN:  Elowen Debruyn D. Legrand Rams, MD   DATE OF BIRTH:  09-May-1940  DATE OF PROCEDURE:  09/20/2011 DATE OF DISCHARGE:                                PROGRESS NOTE   SUBJECTIVE:  The patient continued to have dark tarry and loose stool. He has no frank blood.  The patient is waiting for GI evaluation.  OBJECTIVE:  GENERAL:  The patient is alert, awake, and resting. VITAL SIGNS:  Blood pressure 126/75, pulse 105, respiratory rate 18, temperature 99 degrees Fahrenheit. CHEST:  Clear lung fields.  Good air entry. CARDIOVASCULAR:  First and second heart sounds heard.  No murmur, no gallop. ABDOMEN:  Soft and lax.  Bowel sound is positive.  No mass or organomegaly. EXTREMITIES:  No leg edema.  LABORATORY DATA:  WBC 10.8, hemoglobin 12.0, hematocrit 31.7, and platelets 304.  BMP:  Sodium 134, potassium 5.4, chloride 97, carbon dioxide 22, glucose 86, BUN 60, creatinine 7.31, calcium 9.3.  ASSESSMENT: 1. Rectal bleed, etiology not clear. 2. Mild anemia, probably secondary to blood loss. 3. End-stage renal failure. 4. Hypertension.  PLAN:  We will continue the patient on Protonix.  We will keep him on clear liquid diet.  He is planned for hemodialysis.  GI evaluation is pending.     [name] D. Legrand Rams, MD     TDF/MEDQ  D:  09/20/2011  T:  09/20/2011  Job:  [id number]

## 2011-09-20 NOTE — Consult Note (Signed)
Reason for Consult: End-stage renal disease Referring Physician: [name] is an 71 y.o. male.  HPI: He is a patient was history of hypertension, CVA and end-stage renal disease on maintenance hemodialysis presently came  because of  Hemoccult positive stool. Her patient is still that he start having some dark stool the last couple of days. He didn't see any frank bleeding. Patient denies also any abdominal pain no nausea vomiting. [name]  has previous history of GI bleeding present time he had polyps and some diverticulosis.  Past Medical History  Diagnosis Date  . Hypertension   . Renal disorder     Past Surgical History  Procedure Date  . Cholecystectomy   . Arteriovenous graft placement     History reviewed. No pertinent family history.  Social History:  reports that he has never smoked. He does not have any smokeless tobacco history on file. He reports that he does not drink alcohol or use illicit drugs.  Allergies:  Allergies  Allergen Reactions  . Asa-Ca Carbonate (Aspirin-Calcium Carbonate) Other (See Comments)    bleed    Medications: I have reviewed the patient'[name] current medications.  Results for orders placed during the hospital encounter of 09/19/11 (from the past 48 hour([name]))  OCCULT BLOOD, POC DEVICE     Status: Normal   Collection Time   09/19/11 10:29 AM      Component Value Range Comment   Fecal Occult Bld POSITIVE     CBC     Status: Abnormal   Collection Time   09/19/11 10:36 AM      Component Value Range Comment   WBC 13.3 (*) 4.0 - 10.5 (K/uL)    RBC 6.00 (*) 4.22 - 5.81 (MIL/uL)    Hemoglobin 13.2  13.0 - 17.0 (g/dL)    HCT 41.3  39.0 - 52.0 (%)    MCV 68.8 (*) 78.0 - 100.0 (fL)    MCH 22.0 (*) 26.0 - 34.0 (pg)    MCHC 32.0  30.0 - 36.0 (g/dL)    RDW 19.5 (*) 11.5 - 15.5 (%)    Platelets 309  150 - 400 (K/uL)   DIFFERENTIAL     Status: Abnormal   Collection Time   09/19/11 10:36 AM      Component Value Range Comment   Neutrophils  Relative 62  43 - 77 (%)    Neutro Abs 8.3 (*) 1.7 - 7.7 (K/uL)    Lymphocytes Relative 28  12 - 46 (%)    Lymphs Abs 3.7  0.7 - 4.0 (K/uL)    Monocytes Relative 6  3 - 12 (%)    Monocytes Absolute 0.8  0.1 - 1.0 (K/uL)    Eosinophils Relative 3  0 - 5 (%)    Eosinophils Absolute 0.4  0.0 - 0.7 (K/uL)    Basophils Relative 0  0 - 1 (%)    Basophils Absolute 0.1  0.0 - 0.1 (K/uL)   BASIC METABOLIC PANEL     Status: Abnormal   Collection Time   09/19/11 10:36 AM      Component Value Range Comment   Sodium 136  135 - 145 (mEq/L)    Potassium 5.0  3.5 - 5.1 (mEq/L)    Chloride 96  96 - 112 (mEq/L)    CO2 27  19 - 32 (mEq/L)    Glucose, Bld 86  70 - 99 (mg/dL)    BUN 54 (*) 6 - 23 (mg/dL)    Creatinine,  Ser 9.79 (*) 0.50 - 1.35 (mg/dL)    Calcium 9.8  8.4 - 10.5 (mg/dL)    GFR calc non Af Amer 5 (*) >90 (mL/min)    GFR calc Af Amer 6 (*) >90 (mL/min)   PROTIME-INR     Status: Normal   Collection Time   09/19/11 10:40 AM      Component Value Range Comment   Prothrombin Time 13.4  11.6 - 15.2 (seconds)    INR 1.00  0.00 - 1.49    APTT     Status: Normal   Collection Time   09/19/11 10:40 AM      Component Value Range Comment   aPTT 31  24 - 37 (seconds)   TYPE AND SCREEN     Status: Normal   Collection Time   09/19/11 10:40 AM      Component Value Range Comment   ABO/RH(D) A POS      Antibody Screen NEG      Sample Expiration 09/22/2011     CBC     Status: Abnormal   Collection Time   09/19/11  4:10 PM      Component Value Range Comment   WBC 13.9 (*) 4.0 - 10.5 (K/uL)    RBC 5.86 (*) 4.22 - 5.81 (MIL/uL)    Hemoglobin 13.1  13.0 - 17.0 (g/dL)    HCT 40.3  39.0 - 52.0 (%)    MCV 68.8 (*) 78.0 - 100.0 (fL)    MCH 22.4 (*) 26.0 - 34.0 (pg)    MCHC 32.5  30.0 - 36.0 (g/dL)    RDW 19.3 (*) 11.5 - 15.5 (%)    Platelets 304  150 - 400 (K/uL)   CBC     Status: Abnormal   Collection Time   09/20/11  5:41 AM      Component Value Range Comment   WBC 10.8 (*) 4.0 - 10.5 (K/uL)     RBC 5.44  4.22 - 5.81 (MIL/uL)    Hemoglobin 12.0 (*) 13.0 - 17.0 (g/dL)    HCT 37.1 (*) 39.0 - 52.0 (%)    MCV 68.2 (*) 78.0 - 100.0 (fL)    MCH 22.1 (*) 26.0 - 34.0 (pg)    MCHC 32.3  30.0 - 36.0 (g/dL)    RDW 19.0 (*) 11.5 - 15.5 (%)    Platelets 304  150 - 400 (K/uL)   BASIC METABOLIC PANEL     Status: Abnormal   Collection Time   09/20/11  5:41 AM      Component Value Range Comment   Sodium 134 (*) 135 - 145 (mEq/L)    Potassium 5.4 (*) 3.5 - 5.1 (mEq/L)    Chloride 97  96 - 112 (mEq/L)    CO2 22  19 - 32 (mEq/L)    Glucose, Bld 86  70 - 99 (mg/dL)    BUN 60 (*) 6 - 23 (mg/dL)    Creatinine, Ser 11.31 (*) 0.50 - 1.35 (mg/dL)    Calcium 9.3  8.4 - 10.5 (mg/dL)    GFR calc non Af Amer 4 (*) >90 (mL/min)    GFR calc Af Amer 5 (*) >90 (mL/min)     No results found.  Review of Systems  Respiratory: Negative for cough and shortness of breath.   Cardiovascular: Negative for chest pain and orthopnea.  Gastrointestinal: Positive for blood in stool. Negative for nausea, vomiting and abdominal pain.  Neurological: Negative for weakness.   Blood pressure 102/67, pulse 78, temperature  97.4 F (36.3 C), temperature source Oral, resp. rate 18, height 5\' 6"  (1.676 m), weight 81.647 kg (180 lb), SpO2 95.00%. Physical Exam  Constitutional: No distress.  Eyes: No scleral icterus.  Neck: Neck supple. No JVD present.  Cardiovascular: Normal rate and regular rhythm.   No murmur heard. Respiratory: He has no wheezes. He has no rales.  GI: He exhibits no distension. There is no tenderness.  Musculoskeletal: He exhibits no edema.    Assessment/Plan: Problem #1 end-stage renal disease he status post hemodialysis on Wednesday his pending creatinine was in acceptable range. Problem #2 history of hyperkalemia patient potassium 5.4 slightly high Problem #3 history of GI bleeding. As stated above patient wheeze previous history of  polyps and diverticulosis . His hemoglobin and hematocrit at  this moment seems to be still stable. 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 Plan: We'll make arrangements for patient to get dialysis today We'll hold heparin We'll continue his other medication as before and we'll check his phosphorus in the morning.  [name] 09/20/2011, 7:25 AM

## 2011-09-20 NOTE — Care Management Note (Signed)
    Page 1 of 1   09/20/2011     2:09:54 PM   CARE MANAGEMENT NOTE 09/20/2011  Patient:  [name], [name]   Account Number:  [id number]  Date Initiated:  09/20/2011  Documentation initiated by:  Theophilus Kinds  Subjective/Objective Assessment:   Pt admitted from home with a gi bleed. Pt lives alone and is independent with ADL's PTA. Pt will return home at discharge. Pt is dialysis pt.     Action/Plan:   No CM needs noted.   Anticipated DC Date:  09/26/2011   Anticipated DC Plan:  [place]  CM consult      Choice offered to / List presented to:             Status of service:  Completed, signed off Medicare Important Message given?   (If response is "NO", the following Medicare IM given date fields will be blank) Date Medicare IM given:   Date Additional Medicare IM given:    Discharge Disposition:    Per UR Regulation:    If discussed at Long Length of Stay Meetings, dates discussed:    Comments:  09/20/11 [place], RN BSN CM

## 2011-09-21 ENCOUNTER — Encounter (HOSPITAL_COMMUNITY)
Admission: EM | Disposition: A | Discharge status: Home / Self Care | Payer: Self-pay | Source: Home / Self Care | Attending: Internal Medicine

## 2011-09-21 ENCOUNTER — Encounter (HOSPITAL_COMMUNITY): Payer: Self-pay | Admitting: *Deleted

## 2011-09-21 DIAGNOSIS — K449 Diaphragmatic hernia without obstruction or gangrene: Secondary | ICD-10-CM

## 2011-09-21 DIAGNOSIS — K2960 Other gastritis without bleeding: Secondary | ICD-10-CM

## 2011-09-21 DIAGNOSIS — K921 Melena: Secondary | ICD-10-CM

## 2011-09-21 HISTORY — PX: ESOPHAGOGASTRODUODENOSCOPY: SHX5428

## 2011-09-21 LAB — BASIC METABOLIC PANEL
BUN: 30 mg/dL — ABNORMAL HIGH (ref 6–23)
CO2: 28 mEq/L (ref 19–32)
Calcium: 9 mg/dL (ref 8.4–10.5)
Glucose, Bld: 87 mg/dL (ref 70–99)
Potassium: 4.5 mEq/L (ref 3.5–5.1)
Sodium: 135 mEq/L (ref 135–145)

## 2011-09-21 LAB — CBC
HCT: 37.7 % — ABNORMAL LOW (ref 39.0–52.0)
Hemoglobin: 12.3 g/dL — ABNORMAL LOW (ref 13.0–17.0)
MCH: 22.4 pg — ABNORMAL LOW (ref 26.0–34.0)
RBC: 5.49 MIL/uL (ref 4.22–5.81)

## 2011-09-21 SURGERY — EGD (ESOPHAGOGASTRODUODENOSCOPY)
Anesthesia: Moderate Sedation

## 2011-09-21 MED ORDER — MIDAZOLAM HCL 5 MG/5ML IJ SOLN
INTRAMUSCULAR | Status: AC
  Filled 2011-09-21: qty 10

## 2011-09-21 MED ORDER — MEPERIDINE HCL 100 MG/ML IJ SOLN
INTRAMUSCULAR | Status: DC | PRN
  Administered 2011-09-21 (×2): 25 mg via INTRAVENOUS

## 2011-09-21 MED ORDER — MEPERIDINE HCL 100 MG/ML IJ SOLN
INTRAMUSCULAR | Status: AC
  Filled 2011-09-21: qty 1

## 2011-09-21 MED ORDER — MIDAZOLAM HCL 5 MG/5ML IJ SOLN
INTRAMUSCULAR | Status: DC | PRN
  Administered 2011-09-21: 2 mg via INTRAVENOUS
  Administered 2011-09-21 (×2): 1 mg via INTRAVENOUS

## 2011-09-21 MED ORDER — STERILE WATER FOR IRRIGATION IR SOLN
Status: DC | PRN
  Administered 2011-09-21: 09:00:00

## 2011-09-21 NOTE — Progress Notes (Signed)
[name], MCCLARNON                   ACCOUNT NO.:  [id number]  MEDICAL RECORD NO.:  [id number]  LOCATION:  [id number]                          FACILITY:  [facility]  PHYSICIAN:  Clayborn Milnes D. Legrand Rams, MD   DATE OF BIRTH:  1940/12/10  DATE OF PROCEDURE:  09/21/2011 DATE OF DISCHARGE:                                PROGRESS NOTE   SUBJECTIVE:  The patient still has dark bloody stool which is loose and frequent.  He is scheduled for colonoscopy today.  OBJECTIVE:  GENERAL:  Patient is alert, awake, and resting, not in any form of distress. VITAL SIGNS:  Blood pressure 81/56, pulse 73, respiratory rate 18, temperature 97 degrees Fahrenheit. CHEST:  Clear lung fields.  Good air entry. CARDIOVASCULAR SYSTEM:  First and second heart sounds heard.  No murmur, no gallop. ABDOMEN:  Soft and lax.  Bowel sound is positive.  No mass or organomegaly. EXTREMITIES:  No leg edema.  LABORATORY DATA:  Pending.  ASSESSMENT: 1. Rectal bleed, etiology not clear. 2. Hypertension. 3. End-stage renal failure.  PLAN:  The patient is scheduled for colonoscopy.  Would follow GI recommendation.  We will do CBC daily and we will continue hemodialysis.     [name] D. Legrand Rams, MD     TDF/MEDQ  D:  09/21/2011  T:  09/21/2011  Job:  [id number]

## 2011-09-21 NOTE — Progress Notes (Signed)
Patient will need a follow up appointment with [place] in 4 weeks to reassess and set up a colonoscopy.

## 2011-09-21 NOTE — Progress Notes (Signed)
Followup note Reason for followup end-stage renal disease He is  A patient was history of hypertension and end-stage renal disease presently came with dark stool. Patient has this moment denies any nausea vomiting appetite is good. We'll examination his blood pressure is 90/52 pulse of 100 febrile Chest is clear to auscultation no rales or rhonchi. His heart exam regular rate and rhythm no murmur Abdomen soft positive bowel sound Extremities no edema.  Problem #1 end-stage renal disease is status post hemodialysis yesterday presently he doesn't have any uremic sinus symptoms. Problem #2 history of  GI bleeding his hemoglobin and hematocrit is stable. Patient had previous history of GI bleading and at that  time to be secondary to diverticulosis/polyps Problem #3 history of hypertension patient presently on Norvasc blood pressure seems to be somewhat low. 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 Patient has an appointment to see vascular surgery. Problem #6 history of LVH. Plan: We'll DC Norvasc We'll check his basic metabolic panel, CBC and phosphorus in the morning. History dialysis will be on Monday which is his  regular schedule.

## 2011-09-21 NOTE — Op Note (Signed)
[facility] [address] [place], [place]  [address]  ENDOSCOPY PROCEDURE REPORT  PATIENT:  [name], [name]  MR#:  [id number] BIRTHDATE:  [birth year], 70 yrs. old  GENDER:  male  ENDOSCOPIST:  R. Garfield Cornea, MD Quentin Ore Referred by:  Conni Slipper, M.D. Fran Lowes, M.D.  PROCEDURE DATE:  09/21/2011 PROCEDURE:  EGD with gastric biopsy  INDICATIONS:   melena/Hemoccult positive stool but no significant decline in hemoglobin; in fact hemoglobin today is 12.3-was 12.0 yesterday ; potassium 4.5 this morning after dialysis yesterday afternoon.  INFORMED CONSENT:   The risks, benefits, limitations, alternatives and imponderables have been discussed.  The potential for biopsy, esophogeal dilation, etc. have also been reviewed.  Questions have been answered.  All parties agreeable.  Please see the history and physical in the medical record for more information.  MEDICATIONS:    Versed 4 mg IV and Demerol 50 mg IV in divided doses  DESCRIPTION OF PROCEDURE:   The [id number] endoscope was introduced through the mouth and advanced to the second portion of the duodenum without difficulty or limitations.  The mucosal surfaces were surveyed very carefully during advancement of the scope and upon withdrawal.  Retroflexion view of the proximal stomach and esophagogastric junction was performed.  <<PROCEDUREIMAGES>>  FINDINGS:  Normal esophagus. Stomach empty. Small hiatal hernia. Antral erosions with superficial vascular ectasia present. No ulcer                                         or infiltrating process. Hours. First and second portion of the duodenum appeared normal.  THERAPEUTIC / DIAGNOSTIC MANEUVERS PERFORMED:   Biopsies of antrum.  COMPLICATIONS:   None  IMPRESSION:  Small hiatal hernia. 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 I doubt a recent significant GI bleed.Marland Kitchen  RECOMMENDATIONS:   Advance diet. Okay to continued antiplatelet therapy. Continue concomitant PPI therapy. Followup on pathology. Will arrange outpatient followup with [place] to set up a colonoscopy as it's been nearly 10 years since he had one done.  ______________________________ R. Garfield Cornea, MD Quentin Ore  CC:  n. eSIGNED:   R. Legrand Como Heydy Montilla at 09/21/2011 09:50 AM  Elam Dutch, [id number]

## 2011-09-22 LAB — BASIC METABOLIC PANEL
BUN: 43 mg/dL — ABNORMAL HIGH (ref 6–23)
CO2: 27 mEq/L (ref 19–32)
Chloride: 93 mEq/L — ABNORMAL LOW (ref 96–112)
Creatinine, Ser: 10.63 mg/dL — ABNORMAL HIGH (ref 0.50–1.35)

## 2011-09-22 LAB — CBC
HCT: 36.7 % — ABNORMAL LOW (ref 39.0–52.0)
MCV: 69.2 fL — ABNORMAL LOW (ref 78.0–100.0)
RBC: 5.3 MIL/uL (ref 4.22–5.81)
RDW: 19.4 % — ABNORMAL HIGH (ref 11.5–15.5)
WBC: 8.5 10*3/uL (ref 4.0–10.5)

## 2011-09-22 MED ORDER — PANTOPRAZOLE SODIUM 40 MG PO TBEC
40.0000 mg | DELAYED_RELEASE_TABLET | Freq: Every day | ORAL | Status: DC
  Administered 2011-09-23: 40 mg via ORAL
  Filled 2011-09-22 (×2): qty 1

## 2011-09-22 NOTE — Progress Notes (Signed)
Followup note Reason for followup end-stage renal disease [name] is a patient was history of hypertension, end-stage renal disease on maintenance hemodialysis presently was brought because of  GI bleeding. Presently the patient denies any nausea vomiting and also  he denies any change in the color of stool. On examination patient is alert and apparent distress his blood pressure is 9255 pulse of 96 temperature 97.4 Chest is clear to auscultation no rales or egophony His heart exam revealed regular rate and rhythm Extremities no edema. Problem #2 end-stage renal disease she status post hemodialysis yesterday his pedis 43 creatinine is 10.65 with potassium of 7.5. Problem #2 history of dark stool he is status post upper endoscopy. Presently was found to have hiatal hernia with minor portion. His hemoglobin and hematocrit at this moment is stable Problem #4 history of hypertension blood pressure seems to be somewhat low his antihypertensive medications.. Problem #5 history of hyperphosphatemia phosphorus is 4.7 was in acceptable range Problem #6 history of LVH.  Plan: We'll continue his present management Since the GI is planning to do colonoscopy after  4 weeks as outpatient  If patient is going to be discharge  Next dialysis will be on Tuesday and it will be done as an outpatient.

## 2011-09-23 LAB — CBC
HCT: 37.8 % — ABNORMAL LOW (ref 39.0–52.0)
Hemoglobin: 12.2 g/dL — ABNORMAL LOW (ref 13.0–17.0)
MCH: 22.2 pg — ABNORMAL LOW (ref 26.0–34.0)
MCHC: 32.3 g/dL (ref 30.0–36.0)

## 2011-09-23 MED ORDER — PANTOPRAZOLE SODIUM 40 MG PO TBEC: 40.0000 mg | DELAYED_RELEASE_TABLET | Freq: Every day | ORAL | Status: DC

## 2011-09-23 NOTE — Progress Notes (Signed)
Subjective: Interval History: has no complaint of difficulty breathing. He denies any nausea vomiting. Appetite is good.  Objective: Vital signs in last 24 hours: Temp:  [97.6 F (36.4 C)] 97.6 F (36.4 C) (05/27 0522) Pulse Rate:  [72-91] 77  (05/27 0522) Resp:  [18] 18  (05/27 0522) BP: (112-122)/(62-70) 120/62 mmHg (05/27 0522) SpO2:  [90 %-100 %] 90 % (05/27 0522) Weight change:   Intake/Output from previous day: 05/26 0701 - 05/27 0700 In: 1140 [P.O.:1040] Out: -  Intake/Output this shift: Total I/O In: 240 [P.O.:240] Out: -   General appearance: alert, cooperative and no distress Resp: clear to auscultation bilaterally Cardio: regular rate and rhythm, S1, S2 normal, no murmur, click, rub or gallop GI: soft, non-tender; bowel sounds normal; no masses,  no organomegaly Extremities: extremities normal, atraumatic, no cyanosis or edema  Lab Results:  [facility] 09/23/11 0548 09/22/11 0519  WBC 8.4 8.5  HGB 12.2* 12.0*  HCT 37.8* 36.7*  PLT 287 243   BMET:  Basename 09/22/11 0519 09/21/11 0655  NA 132* 135  K 3.9 4.5  CL 93* 97  CO2 27 28  GLUCOSE 104* 87  BUN 43* 30*  CREATININE 10.63* 8.52*  CALCIUM 8.4 9.0   No results found for this basename: PTH:2 in the last 72 hours Iron Studies: No results found for this basename: IRON,TIBC,TRANSFERRIN,FERRITIN in the last 72 hours  Studies/Results: No results found.  I have reviewed the patient's current medications.  Assessment/Plan: Problem #1 end-stage renal disease is status post hemodialysis on Friday his BUN is 43 creatinine is 10.63 from yesterday and potassium 3.9 stable.  Problem #2 anemia his hemoglobin is 12.2 hematocrit 7.8  Problem #3 history of hypertension blood pressure is reasonably controlled he is off Norvasc. 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 Problem #6 history of LVH Problem #7 history of GERD is on Protonix is a symptomatic. Plan: We'll make arrangements for patient to get dialysis tomorrow as an outpatient.  LOS: 4 days   Charae Depaolis S 09/23/2011,10:27 AM

## 2011-09-23 NOTE — Discharge Summary (Signed)
NAMEWILMONT, [name]                   ACCOUNT NO.:  [id number]  MEDICAL RECORD NO.:  [id number]  LOCATION:  [id number]                          FACILITY:  [facility]  PHYSICIAN:  Jayko Voorhees D. Legrand Rams, MD   DATE OF BIRTH:  1940/09/01  DATE OF ADMISSION:  09/19/2011 DATE OF DISCHARGE:  05/27/2013LH                              DISCHARGE SUMMARY   DISCHARGE DIAGNOSES: 1. Rectal bleed, etiology not clear. 2. Anemia secondary to the above. 3. End-stage renal failure, on hemodialysis. 4. Hypertension.  DISCHARGE MEDICATIONS: 1. Protonix 40 mg daily. 2. Xanax 0.25 mg t.i.d. 3. Amlodipine 5 mg p.o. daily. 4. Vitamin B with folic acid 0.8 mg daily. 5. Sensipar 60 mg daily. 6. Lomotil 1 tablet p.r.n. 7. Gabapentin 600 mg daily. 8. Metoprolol 50 mg daily. 9. __________ 800 mg 3 tablets p.o. t.i.d. with meals.  DISPOSITION:  The patient is discharged to home in stable condition.  LABS ON DISCHARGE:  CBC:  WBC 8.4, hemoglobin 12.2, hematocrit 37.8, and platelet 287.  DISCHARGE INSTRUCTION:  The patient will continue Protonix.  He will temporarily hold Plavix.  The patient will be followed in the office in 1-week duration.  HOSPITAL COURSE:  This is a 71 year old male patient with history of multiple medical illnesses who was admitted due to rectal bleed.  The patient was admitted and started on Protonix.  He was seen by GI and EGD was done.  His EGD result was negative.  The patient remained stable.  There was no significant drop in his hemoglobin and hematocrit.  His rectal bleed subsided at this time.  He is planned to be seen by a GI in 1- month duration.  The patient will be discharged to be followed in outpatient.     Aimi Essner D. Legrand Rams, MD     TDF/MEDQ  D:  09/23/2011  T:  09/23/2011  Job:  [id number]

## 2011-09-23 NOTE — Progress Notes (Signed)
[name], [name]                   ACCOUNT NO.:  [id number]  MEDICAL RECORD NO.:  [id number]  LOCATION:  [id number]                          FACILITY:  [facility]  PHYSICIAN:  Khalis Hittle D. Legrand Rams, MD   DATE OF BIRTH:  [birth year]  DATE OF PROCEDURE: DATE OF DISCHARGE:                                PROGRESS NOTE   SUBJECTIVE:  The patient feels better.  His rectal bleed is improving. He had EGD yesterday.  No fever or chills.  OBJECTIVE:  GENERAL:  The patient is alert, awake, and resting. VITAL SIGNS:  Blood pressure 130/80, pulse 87, respiratory rate 18, temperature 98.7 degrees Fahrenheit. CHEST:  Clear. LUNGS:  Good air entry. CARDIOVASCULAR:  First and second heart sound heard.  No murmur.  No gallop. ABDOMEN:  Soft and lax.  Bowel sound is positive.  No mass or organomegaly. EXTREMITIES:  No leg edema.  LABS:  CBC; WBC 7.6, hemoglobin 12.3, hematocrit 37.6, and platelets 232.  ASSESSMENT: 1. Rectal bleed, clinically improving. 2. Mild anemia secondary to the above. 3. End-stage renal failure, on hemodialysis. 4. Hypertension.  PLAN:  We will continue to monitor his CBC.  We will continue hemodialysis.  If his rectal bleed subsides and the patient remained stable, we will plan to discharge him in a.m.     Joseph Kubicek D. Legrand Rams, MD     TDF/MEDQ  D:  09/22/2011  T:  09/23/2011  Job:  [id number]

## 2011-09-24 ENCOUNTER — Encounter (INDEPENDENT_AMBULATORY_CARE_PROVIDER_SITE_OTHER): Payer: Medicare Other | Admitting: *Deleted

## 2011-09-24 ENCOUNTER — Ambulatory Visit (INDEPENDENT_AMBULATORY_CARE_PROVIDER_SITE_OTHER): Payer: Medicare Other | Admitting: Vascular Surgery

## 2011-09-24 ENCOUNTER — Encounter: Payer: Self-pay | Admitting: Vascular Surgery

## 2011-09-24 VITALS — BP 128/79 | HR 85 | Resp 18 | Ht 65.0 in | Wt 191.4 lb

## 2011-09-24 DIAGNOSIS — N2581 Secondary hyperparathyroidism of renal origin: Secondary | ICD-10-CM | POA: Diagnosis not present

## 2011-09-24 DIAGNOSIS — T82898A Other specified complication of vascular prosthetic devices, implants and grafts, initial encounter: Secondary | ICD-10-CM | POA: Diagnosis not present

## 2011-09-24 DIAGNOSIS — D509 Iron deficiency anemia, unspecified: Secondary | ICD-10-CM | POA: Diagnosis not present

## 2011-09-24 DIAGNOSIS — N186 End stage renal disease: Secondary | ICD-10-CM

## 2011-09-24 DIAGNOSIS — N039 Chronic nephritic syndrome with unspecified morphologic changes: Secondary | ICD-10-CM | POA: Diagnosis not present

## 2011-09-24 NOTE — Progress Notes (Signed)
Patient has today for evaluation of possible steal. He has a long history of AV access dating back to several years. He currently has a right upper arm AV fistula. He reports tingling in his right hand and this can be worse at night and occasionally is worse while on hemodialysis. He does not have any tissue loss related to this. He does not have any symptoms in his left arm.  Past Medical History  Diagnosis Date  . Hypertension   . ESRD (end stage renal disease) on dialysis   . CVA (cerebral infarction)   . Gastric ulcer 2004    treated for h.pylori see PSH  . LVH (left ventricular hypertrophy)     History  Substance Use Topics  . Smoking status: Former Smoker    Quit date: 04/29/2004  . Smokeless tobacco: Not on file   Comment: quit 2006  . Alcohol Use: No    Family History  Problem Relation Age of Onset  . Colon cancer Neg Hx   . Liver disease Neg Hx     Allergies  Allergen Reactions  . Asa-Ca Carbonate (Aspirin-Calcium Carbonate) Other (See Comments)    bleed    Current outpatient prescriptions:ALPRAZolam (XANAX) 0.25 MG tablet, Take 0.25 mg by mouth 3 (three) times daily., Disp: , Rfl: ;  amLODipine (NORVASC) 5 MG tablet, Take 5 mg by mouth daily., Disp: , Rfl: ;  b complex-vitamin c-folic acid (NEPHRO-VITE) 0.8 MG TABS, Take 0.8 mg by mouth daily., Disp: , Rfl: ;  cinacalcet (SENSIPAR) 60 MG tablet, Take 60 mg by mouth daily. With evening meal, Disp: , Rfl:  diphenoxylate-atropine (LOMOTIL) 2.5-0.025 MG per tablet, Take 1 tablet by mouth as needed. diarrhea, Disp: , Rfl: ;  gabapentin (NEURONTIN) 600 MG tablet, Take 600 mg by mouth at bedtime., Disp: , Rfl: ;  hydrOXYzine (ATARAX/VISTARIL) 10 MG tablet, Take 10 mg by mouth at bedtime as needed. itching, Disp: , Rfl:  metoprolol succinate (TOPROL-XL) 50 MG 24 hr tablet, Take 50 mg by mouth daily. Take with or immediately following a meal., Disp: , Rfl: ;  ondansetron (ZOFRAN) 4 MG tablet, Take 4 mg by mouth every 6 (six) hours  as needed. nausea, Disp: , Rfl: ;  pantoprazole (PROTONIX) 40 MG tablet, Take 1 tablet (40 mg total) by mouth daily at 12 noon., Disp: 30 tablet, Rfl: 3 sevelamer (RENVELA) 800 MG tablet, Take 2,400-4,000 mg by mouth 3 (three) times daily with meals. Patient takes 5 tablets with meals and 3 tablets with snacks, Disp: , Rfl: ;  lidocaine-prilocaine (EMLA) cream, Apply 1 application topically as needed. Apply to fistula 1 hour  Before treatment, Disp: , Rfl:   BP 128/79  Pulse 85  Resp 18  Ht 5\' 5"  (1.651 m)  Wt 191 lb 6.4 oz (86.818 kg)  BMI 31.85 kg/m2  Body mass index is 31.85 kg/(m^2).       His exam well-developed well-nourished gentleman in no acute distress. He does have 2+ radial pulses bilaterally. He is a well-developed right upper arm AV fistula. His pains are warm with no evidence of tissue loss. He has equal grip strength bilaterally. He does have some mild swelling in his right versus left arm.  Noninvasive vascular laboratory studies today revealed normal pressures bilaterally and normal digital waveforms bilaterally he has triphasic radial and ulnar signals bilaterally  Impression and plan: Symptoms can be compatible with mild steel in his right hand. His Doppler flow and pulse status is normal. I explained to the patient  there is no danger associated with this. Ex-preemie only option would be ligation of this fistula if his symptoms progress. He reports at this is not the case and he is comfortable continuing to use the fistula with observation of his symptoms. He will notify should he develop any progressive problems otherwise will be seen on an as-needed basis

## 2011-09-25 ENCOUNTER — Encounter (HOSPITAL_COMMUNITY): Payer: Self-pay | Admitting: Internal Medicine

## 2011-09-25 DIAGNOSIS — D631 Anemia in chronic kidney disease: Secondary | ICD-10-CM | POA: Diagnosis not present

## 2011-09-25 DIAGNOSIS — D509 Iron deficiency anemia, unspecified: Secondary | ICD-10-CM | POA: Diagnosis not present

## 2011-09-25 DIAGNOSIS — N2581 Secondary hyperparathyroidism of renal origin: Secondary | ICD-10-CM | POA: Diagnosis not present

## 2011-09-25 DIAGNOSIS — N186 End stage renal disease: Secondary | ICD-10-CM | POA: Diagnosis not present

## 2011-09-27 DIAGNOSIS — N2581 Secondary hyperparathyroidism of renal origin: Secondary | ICD-10-CM | POA: Diagnosis not present

## 2011-09-27 DIAGNOSIS — N039 Chronic nephritic syndrome with unspecified morphologic changes: Secondary | ICD-10-CM | POA: Diagnosis not present

## 2011-09-27 DIAGNOSIS — D509 Iron deficiency anemia, unspecified: Secondary | ICD-10-CM | POA: Diagnosis not present

## 2011-09-27 DIAGNOSIS — N186 End stage renal disease: Secondary | ICD-10-CM | POA: Diagnosis not present

## 2011-09-30 ENCOUNTER — Encounter: Payer: Self-pay | Admitting: Internal Medicine

## 2011-09-30 DIAGNOSIS — N2581 Secondary hyperparathyroidism of renal origin: Secondary | ICD-10-CM | POA: Diagnosis not present

## 2011-09-30 DIAGNOSIS — D509 Iron deficiency anemia, unspecified: Secondary | ICD-10-CM | POA: Diagnosis not present

## 2011-09-30 DIAGNOSIS — N186 End stage renal disease: Secondary | ICD-10-CM | POA: Diagnosis not present

## 2011-10-02 DIAGNOSIS — N2581 Secondary hyperparathyroidism of renal origin: Secondary | ICD-10-CM | POA: Diagnosis not present

## 2011-10-02 DIAGNOSIS — N186 End stage renal disease: Secondary | ICD-10-CM | POA: Diagnosis not present

## 2011-10-02 DIAGNOSIS — D509 Iron deficiency anemia, unspecified: Secondary | ICD-10-CM | POA: Diagnosis not present

## 2011-10-03 ENCOUNTER — Emergency Department (HOSPITAL_COMMUNITY)
Admission: EM | Admit: 2011-10-03 | Discharge: 2011-10-03 | Disposition: A | Discharge status: Home / Self Care | Payer: Medicare Other | Attending: Emergency Medicine | Admitting: Emergency Medicine

## 2011-10-03 ENCOUNTER — Encounter (HOSPITAL_COMMUNITY): Payer: Self-pay | Admitting: *Deleted

## 2011-10-03 DIAGNOSIS — K625 Hemorrhage of anus and rectum: Secondary | ICD-10-CM | POA: Insufficient documentation

## 2011-10-03 DIAGNOSIS — N186 End stage renal disease: Secondary | ICD-10-CM | POA: Diagnosis not present

## 2011-10-03 DIAGNOSIS — Z8673 Personal history of transient ischemic attack (TIA), and cerebral infarction without residual deficits: Secondary | ICD-10-CM | POA: Diagnosis not present

## 2011-10-03 DIAGNOSIS — Z992 Dependence on renal dialysis: Secondary | ICD-10-CM | POA: Insufficient documentation

## 2011-10-03 DIAGNOSIS — Z87891 Personal history of nicotine dependence: Secondary | ICD-10-CM | POA: Diagnosis not present

## 2011-10-03 DIAGNOSIS — I120 Hypertensive chronic kidney disease with stage 5 chronic kidney disease or end stage renal disease: Secondary | ICD-10-CM | POA: Diagnosis not present

## 2011-10-03 LAB — CBC
Hemoglobin: 13.4 g/dL (ref 13.0–17.0)
MCH: 22.2 pg — ABNORMAL LOW (ref 26.0–34.0)
MCH: 22.4 pg — ABNORMAL LOW (ref 26.0–34.0)
MCHC: 32 g/dL (ref 30.0–36.0)
MCV: 70.1 fL — ABNORMAL LOW (ref 78.0–100.0)
MCV: 69.9 fL — ABNORMAL LOW (ref 78.0–100.0)
Platelets: 347 10*3/uL (ref 150–400)
RDW: 19.4 % — ABNORMAL HIGH (ref 11.5–15.5)

## 2011-10-03 LAB — BASIC METABOLIC PANEL
BUN: 24 mg/dL — ABNORMAL HIGH (ref 6–23)
GFR calc non Af Amer: 5 mL/min — ABNORMAL LOW (ref >90)
Glucose, Bld: 91 mg/dL (ref 70–99)
Potassium: 4.3 mEq/L (ref 3.5–5.1)

## 2011-10-03 LAB — DIFFERENTIAL
Basophils Absolute: 0.1 10*3/uL (ref 0.0–0.1)
Lymphs Abs: 2.6 10*3/uL (ref 0.7–4.0)
Monocytes Absolute: 0.5 10*3/uL (ref 0.1–1.0)

## 2011-10-03 NOTE — ED Notes (Signed)
Paged Dr. Oneida Alar for Dr. Christy Gentles for consult.

## 2011-10-03 NOTE — Discharge Instructions (Signed)
Please have them hold your heparin during dialysis tomorrow  Rectal Bleeding  Rectal bleeding is when blood comes out of the opening of the butt (anus). Rectal bleeding may show up as bright red blood or really dark poop (stool). The poop may look dark red, maroon, or black. Rectal bleeding is often a sign that something is wrong. This needs to be checked by a doctor.  HOME CARE  Eat a diet high in fiber. This will help keep your poop soft.   Limit activitiy.   Drink enough fluids to keep your pee (urine) clear or pale yellow.   Take a warm bath to soothe any pain.   Follow up with your doctor as told.  GET HELP RIGHT AWAY IF:  You have more bleeding.   You have black or dark red poop.   You throw up (vomit) blood or it looks like coffee grounds.   You have belly (abdominal) pain or tenderness.   You have a fever.   You feel weak, sick to your stomach (nauseous), or you pass out (faint).   You have pain that is so bad you cannot poop (bowel movement).  MAKE SURE YOU:  Understand these instructions.   Will watch your condition.   Will get help right away if you are not doing well or get worse.  Document Released: 12/26/2010 Document Revised: 04/04/2011 Document Reviewed: 12/26/2010 [facility] Patient Information 2012 [place].

## 2011-10-03 NOTE — ED Notes (Signed)
C/o rectal bleeding, denies pain

## 2011-10-03 NOTE — ED Provider Notes (Signed)
History  This chart was scribed for Sharyon Cable, MD by Jenne Campus. This patient was seen in room APA11/APA11 and the patient's care was started at 7:14PM.  CSN: [id number]  Arrival date & time 10/03/11  1845   First MD Initiated Contact with Patient 10/03/11 1914      Chief Complaint  Patient presents with  . Rectal Bleeding    The history is provided by the patient. No language interpreter was used.    [name] is a 71 y.o. male who presents to the Emergency Department complaining of one episode of rectal bleeding described as dark red blood mixed with dark stool that occurred today.Pt states that he was admitted about a week ago for the same symptoms. He reports that the symptoms were resolved during his stay but the cause was not discovered. He states that he did not receive a blood transfusion during that stay. He reports that he followed up with his PCP today and was told that he has a colonoscopy scheduled on 10/22/11 to further investigate the symptoms. He denies any further episodes stating that his stool had been back to normal until today. He denies fevers, emesis, SOB, weakness, light-headed, back and abdominal pain as associated symptoms.He has a h/o ESRD for which he receives dialysis on Monday, Wednesday, and Friday. He also has a h/o gastric ulcer, HTN, CVA and LVH. He is a former smoker but denies alcohol use.    Dr. Legrand Rams is PCP.  Past Medical History  Diagnosis Date  . Hypertension   . ESRD (end stage renal disease) on dialysis   . CVA (cerebral infarction)   . Gastric ulcer 2004    treated for h.pylori see PSH  . LVH (left ventricular hypertrophy)     Past Surgical History  Procedure Date  . Cholecystectomy   . Arteriovenous graft placement   . Shoulder surgery   . Colonoscopy 2004    Dr. Irving Shows, left sided diverticula and cecal polyp, path unknown  . Esophagogastroduodenoscopy 11/2002    Dr. Gala Romney, erosive reflux esophagitis, multiple gastric  ulcer and antral/bulbar erosions. Serologies positive for H.Pylori and was treated  . Esophagogastroduodenoscopy 11/20014    Dr. Gala Romney, small hh only, ulcers healed  . Esophagogastroduodenoscopy 09/21/2011    Procedure: ESOPHAGOGASTRODUODENOSCOPY (EGD);  Surgeon: Daneil Dolin, MD;  Location: [facility] ENDO SUITE;  Service: Endoscopy;  Laterality: N/A;    Family History  Problem Relation Age of Onset  . Colon cancer Neg Hx   . Liver disease Neg Hx     History  Substance Use Topics  . Smoking status: Former Smoker    Quit date: 04/29/2004  . Smokeless tobacco: Not on file   Comment: quit 2006  . Alcohol Use: No      Review of Systems  A complete 10 system review of systems was obtained and all systems are negative except as noted in the HPI and PMH.    Allergies  Asa-ca carbonate  Home Medications   Current Outpatient Rx  Name Route Sig Dispense Refill  . ALPRAZOLAM 0.25 MG PO TABS Oral Take 0.25 mg by mouth 3 (three) times daily.    Marland Kitchen AMLODIPINE BESYLATE 5 MG PO TABS Oral Take 5 mg by mouth daily.    Marland Kitchen NEPHRO-VITE 0.8 MG PO TABS Oral Take 0.8 mg by mouth daily.    Marland Kitchen CINACALCET HCL 60 MG PO TABS Oral Take 60 mg by mouth daily. With evening meal    . DIPHENOXYLATE-ATROPINE  2.5-0.025 MG PO TABS Oral Take 1 tablet by mouth as needed. diarrhea    . GABAPENTIN 600 MG PO TABS Oral Take 600 mg by mouth at bedtime.    Marland Kitchen HYDROXYZINE HCL 10 MG PO TABS Oral Take 10 mg by mouth at bedtime as needed. itching    . LIDOCAINE-PRILOCAINE 2.5-2.5 % EX CREA Topical Apply 1 application topically as needed. Apply to fistula 1 hour  Before treatment    . METOPROLOL SUCCINATE ER 50 MG PO TB24 Oral Take 50 mg by mouth daily. Take with or immediately following a meal.    . ONDANSETRON HCL 4 MG PO TABS Oral Take 4 mg by mouth every 6 (six) hours as needed. nausea    . PANTOPRAZOLE SODIUM 40 MG PO TBEC Oral Take 1 tablet (40 mg total) by mouth daily at 12 noon. 30 tablet 3  . SEVELAMER CARBONATE 800 MG  PO TABS Oral Take 2,400-4,000 mg by mouth 3 (three) times daily with meals. Patient takes 5 tablets with meals and 3 tablets with snacks      Triage Vitals: BP 130/79  Pulse 88  Temp(Src) 98.4 F (36.9 C) (Oral)  Resp 20  Ht 5\' 6"  (1.676 m)  Wt 180 lb (81.647 kg)  BMI 29.05 kg/m2  SpO2 98% BP 155/83  Pulse 86  Temp(Src) 98.4 F (36.9 C) (Oral)  Resp 22  Ht 5\' 6"  (1.676 m)  Wt 180 lb (81.647 kg)  BMI 29.05 kg/m2  SpO2 96%   Physical Exam  Nursing note and vitals reviewed.  CONSTITUTIONAL: Well developed/well nourished HEAD AND FACE: Normocephalic/atraumatic EYES: EOMI/PERRL, conjunctiva pink ENMT: Mucous membranes moist NECK: supple no meningeal signs SPINE:entire spine nontender CV: S1/S2 noted, no murmurs/rubs/gallops noted LUNGS: Lungs are clear to auscultation bilaterally, no apparent distress ABDOMEN: soft, nontender, no rebound or guarding GU: no cva tenderness Rectal: no hemorrhoids, dark stool noted but no gross melena noted, no bright red blood , chaperone present, faintly positive stool card NEURO: Pt is awake/alert, moves all extremitiesx4 EXTREMITIES: pulses normal, full ROM, dialysis access in right arm with thrill noted  SKIN: warm, color normal PSYCH: no abnormalities of mood noted  ED Course  Procedures   DIAGNOSTIC STUDIES: Oxygen Saturation is 98% on room air, normal by my interpretation.    COORDINATION OF CARE: 7:22PM-Discussed rectal exam with pt and pt agreed. 7:25PM-Discussed treatment plan which includes blood work and consultation with GI specialists with pt and pt agreed to plan. D/w dr fields We discussed recent EGD results (no significant findings) and normal hemoglobin and without symptoms of anemia Likely safe for d/c as long as heparin can be held at dialysis Pt agreeable to this, but will monitor in ED, recheck cbc at 2230 (over 4 hrs since last BM) and if normal safe for d/c 11:40 PM Repeat HGB unchanged He denies further rectal  bleeding Denies dizziness/weakness Feel he is safe/stable for d/c Discussed strict return precautions He will hold his heparin at HD tomorrow  The patient appears reasonably screened and/or stabilized for discharge and I doubt any other medical condition or other [facility] requiring further screening, evaluation, or treatment in the ED at this time prior to discharge.    Labs Reviewed  CBC  DIFFERENTIAL  BASIC METABOLIC PANEL  OCCULT BLOOD X 1 CARD TO LAB, STOOL     MDM  Nursing notes including past medical history, social history and family history reviewed and considered in documentation All labs/vitals reviewed and considered Previous records reviewed and  considered - recent EGD shows possible antral erosions   I personally performed the services described in this documentation, which was scribed in my presence. The recorded information has been reviewed and considered.          Sharyon Cable, MD 10/03/11 2342

## 2011-10-04 ENCOUNTER — Telehealth: Payer: Self-pay | Admitting: Internal Medicine

## 2011-10-04 ENCOUNTER — Telehealth: Payer: Self-pay | Admitting: Gastroenterology

## 2011-10-04 DIAGNOSIS — D509 Iron deficiency anemia, unspecified: Secondary | ICD-10-CM | POA: Diagnosis not present

## 2011-10-04 DIAGNOSIS — N186 End stage renal disease: Secondary | ICD-10-CM | POA: Diagnosis not present

## 2011-10-04 DIAGNOSIS — N2581 Secondary hyperparathyroidism of renal origin: Secondary | ICD-10-CM | POA: Diagnosis not present

## 2011-10-04 NOTE — Telephone Encounter (Signed)
Pt presented to ed with rectal bleeding X1. Needs OPV ASAP TO SEE DR. Gala Romney OR AN EXTENDER. PT HAS ESRD AND IS ON DIALYSIS.

## 2011-10-04 NOTE — Telephone Encounter (Signed)
Pt needs to be seen next week.

## 2011-10-04 NOTE — Telephone Encounter (Signed)
Per [facility] patient was seen in the ED with rectal bleeding  X 1 and needs OV ASAP to see RMR or an Extender. Pt has ESRD and is on dialysis. Pt already has OV set up for 6/25 @ 230 with KJ. Please advise.

## 2011-10-04 NOTE — Telephone Encounter (Signed)
I have urgent on Tues.

## 2011-10-07 DIAGNOSIS — D509 Iron deficiency anemia, unspecified: Secondary | ICD-10-CM | POA: Diagnosis not present

## 2011-10-07 DIAGNOSIS — N186 End stage renal disease: Secondary | ICD-10-CM | POA: Diagnosis not present

## 2011-10-07 DIAGNOSIS — N2581 Secondary hyperparathyroidism of renal origin: Secondary | ICD-10-CM | POA: Diagnosis not present

## 2011-10-07 NOTE — Telephone Encounter (Signed)
Pt is aware of OV tomorrow with KJ at [facility]

## 2011-10-07 NOTE — Telephone Encounter (Signed)
Pt is aware of OV tomorrow at 1130 with KJ

## 2011-10-07 NOTE — Telephone Encounter (Signed)
Tried calling patient to offer OV for tomorrow with KJ at 3pm. No answer and no voice mail. I will try again later. Pt gets Dialysis on M,W,F.

## 2011-10-08 ENCOUNTER — Other Ambulatory Visit: Payer: Self-pay | Admitting: Internal Medicine

## 2011-10-08 ENCOUNTER — Ambulatory Visit (INDEPENDENT_AMBULATORY_CARE_PROVIDER_SITE_OTHER): Payer: Medicare Other | Admitting: Urgent Care

## 2011-10-08 ENCOUNTER — Encounter: Payer: Self-pay | Admitting: Urgent Care

## 2011-10-08 VITALS — BP 118/66 | HR 94 | Temp 97.6°F | Ht 66.0 in | Wt 188.0 lb

## 2011-10-08 DIAGNOSIS — K921 Melena: Secondary | ICD-10-CM

## 2011-10-08 DIAGNOSIS — Z1211 Encounter for screening for malignant neoplasm of colon: Secondary | ICD-10-CM

## 2011-10-08 MED ORDER — PEG 3350-KCL-NA BICARB-NACL 420 G PO SOLR: ORAL | Status: AC

## 2011-10-08 NOTE — Patient Instructions (Signed)
You need colonoscopy with Dr. Gala Romney

## 2011-10-08 NOTE — Assessment & Plan Note (Addendum)
[name] is a pleasant 71 y.o. male with recent history of dark stools. Recent EGD shows antral erosions and early GAVE.  He has had no further melena. He is due for colonoscopy. History of polyps ?path 2004.  I have discussed risks & benefits which include, but are not limited to, bleeding, infection, perforation & drug reaction.  The patient agrees with this plan & written consent will be obtained.

## 2011-10-08 NOTE — Progress Notes (Signed)
Primary Care Physician:  [name], MD, MD Primary Gastroenterologist:  Dr. Gala Romney  Chief Complaint  Patient presents with  . Colonoscopy   HPI:  [name] is a 71 y.o. male here for follow-up to set up colonoscopy. He was seen while hospitalized May 24 for melena. He underwent EGD at that time by Dr. 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 His hemoglobin drifted down to just below normal (12g). He denies any other significant GI complaints. His last colonoscopy was back in 2004 & he had a cecal polyp. He is due for colonoscopy at this time. He was seen in the emergency department last week with complaints of dark stools. His hemoglobin was 13.4.  He was sent home and has seen no further melena or rectal bleeding since then. He denies any lower GI symptoms including constipation, diarrhea, rectal bleeding, melena or weight loss.  Denies any upper GI symptoms including heartburn, indigestion, nausea, vomiting, dysphagia, odynophagia or anorexia. He has been off Plavix since hospital discharge.   Past Medical History  Diagnosis Date  . Hypertension   . ESRD (end stage renal disease) on dialysis   . CVA (cerebral infarction)   . Gastric ulcer 2004    treated for h.pylori see PSH  . LVH (left ventricular hypertrophy)     Past Surgical History  Procedure Date  . Cholecystectomy   . Arteriovenous graft placement   . Shoulder surgery   . Colonoscopy 2004    Dr. Irving Shows, left sided diverticula and cecal polyp, path unknown  . Esophagogastroduodenoscopy 11/2002    Dr. Gala Romney, erosive reflux esophagitis, multiple gastric ulcer and antral/bulbar erosions. Serologies positive for H.Pylori and was treated  . Esophagogastroduodenoscopy 11/20014    Dr. Gala Romney, small hh only, ulcers healed  . Esophagogastroduodenoscopy 09/21/2011    Dr Trevor [facility], antral erosions, ?early GAVE    Current Outpatient Prescriptions  Medication Sig Dispense Refill    . ALPRAZolam (XANAX) 0.25 MG tablet Take 0.25 mg by mouth 3 (three) times daily.      Marland Kitchen amLODipine (NORVASC) 5 MG tablet Take 5 mg by mouth daily.      Marland Kitchen b complex-vitamin c-folic acid (NEPHRO-VITE) 0.8 MG TABS Take 0.8 mg by mouth every other day. Takes on Tuesdays, Thursdays, Saturdays, and Sundays. Does not take on Mondays, Wednesdays, and Fridays due to Dialysis treatments.      . cinacalcet (SENSIPAR) 60 MG tablet Take 60 mg by mouth every evening. With evening meal      . hydrOXYzine (ATARAX/VISTARIL) 10 MG tablet Take 10 mg by mouth as needed. For itching      . lidocaine-prilocaine (EMLA) cream Apply 1 application topically as needed. Apply to fistula 1 hour  Before treatment      . metoprolol succinate (TOPROL-XL) 50 MG 24 hr tablet Take 50 mg by mouth daily. Take with or immediately following a meal.      . pantoprazole (PROTONIX) 40 MG tablet Take 1 tablet (40 mg total) by mouth daily at 12 noon.  30 tablet  3  . promethazine (PHENERGAN) 25 MG tablet Take 12.5 mg by mouth Daily.      . sevelamer (RENVELA) 800 MG tablet Take 2,400-4,000 mg by mouth 3 (three) times daily with meals. Patient takes 5 tablets with meals and 3 tablets with snacks        Allergies as of 10/08/2011 - Review Complete 10/08/2011  Allergen Reaction Noted  . Aspirin  10/03/2011  Family History  Problem Relation Age of Onset  . Colon cancer Neg Hx   . Liver disease Neg Hx     History   Social History  . Marital Status: Single    Spouse Name: N/A    Number of Children: 2  . Years of Education: N/A   Occupational History  . retired, [employer]    Social History Main Topics  . Smoking status: Former Smoker    Quit date: 04/29/2004  . Smokeless tobacco: Not on file   Comment: quit 2006  . Alcohol Use: No  . Drug Use: No  . Sexually Active: Not on file   Other Topics Concern  . Not on file   Social History Narrative   Lives aloneDaughter 20-min away   Review of Systems: Gen: Denies any  fever, chills, sweats, anorexia, fatigue, weakness, malaise, weight loss, and sleep disorder CV: Denies chest pain, angina, palpitations, syncope, orthopnea, PND, peripheral edema, and claudication. Resp: Denies dyspnea at rest, dyspnea with exercise, cough, sputum, wheezing, coughing up blood, and pleurisy. GI: Denies vomiting blood, jaundice, and fecal incontinence.   Denies dysphagia or odynophagia. GU :On dialysis MS: Denies joint pain, limitation of movement, and swelling, stiffness, low back pain, extremity pain. Denies muscle weakness, cramps, atrophy.  Derm: Denies rash, itching, dry skin, hives, moles, warts, or unhealing ulcers.  Psych: Denies depression, anxiety, memory loss, suicidal ideation, hallucinations, paranoia, and confusion. Heme: Denies bruising, bleeding, and enlarged lymph nodes. Neuro:  Denies any headaches, dizziness, paresthesias. Endo:  Denies any problems with DM, thyroid, adrenal function.  Physical Exam: BP 118/66  Pulse 94  Temp(Src) 97.6 F (36.4 C) (Temporal)  Ht 5\' 6"  (1.676 m)  Wt 188 lb (85.276 kg)  BMI 30.34 kg/m2 General:   Alert,  Well-developed, well-nourished, pleasant and cooperative in NAD Head:  Normocephalic and atraumatic. Eyes:  Sclera clear, no icterus.   Conjunctiva pink. Ears:  Normal auditory acuity. Nose:  No deformity, discharge, or lesions. Mouth:  No deformity or lesions,oropharynx pink & moist.  Poor dentition. Neck:  Supple; no masses or thyromegaly. Lungs:  Clear throughout to auscultation.   No wheezes, crackles, or rhonchi. No acute distress. Heart:  Regular rate and rhythm; no murmurs, clicks, rubs,  or gallops. Abdomen:  Normal bowel sounds.  No bruits.  Soft, non-tender and non-distended without masses, hepatosplenomegaly or hernias noted.  No guarding or rebound tenderness.   Rectal:  Deferred. Msk:  Symmetrical without gross deformities. Normal posture. Pulses:  Normal pulses noted. Extremities:  + clubbing.  No  edema. Neurologic:  Alert and  oriented x4;  grossly normal neurologically. Skin:  Intact without significant lesions or rashes. Lymph Nodes:  No significant cervical adenopathy. Psych:  Alert and cooperative. Normal mood and affect.

## 2011-10-08 NOTE — Progress Notes (Signed)
Faxed to PCP

## 2011-10-09 DIAGNOSIS — N2581 Secondary hyperparathyroidism of renal origin: Secondary | ICD-10-CM | POA: Diagnosis not present

## 2011-10-09 DIAGNOSIS — N186 End stage renal disease: Secondary | ICD-10-CM | POA: Diagnosis not present

## 2011-10-09 DIAGNOSIS — D509 Iron deficiency anemia, unspecified: Secondary | ICD-10-CM | POA: Diagnosis not present

## 2011-10-11 DIAGNOSIS — N186 End stage renal disease: Secondary | ICD-10-CM | POA: Diagnosis not present

## 2011-10-11 DIAGNOSIS — N2581 Secondary hyperparathyroidism of renal origin: Secondary | ICD-10-CM | POA: Diagnosis not present

## 2011-10-11 DIAGNOSIS — D509 Iron deficiency anemia, unspecified: Secondary | ICD-10-CM | POA: Diagnosis not present

## 2011-10-14 DIAGNOSIS — N2581 Secondary hyperparathyroidism of renal origin: Secondary | ICD-10-CM | POA: Diagnosis not present

## 2011-10-14 DIAGNOSIS — N186 End stage renal disease: Secondary | ICD-10-CM | POA: Diagnosis not present

## 2011-10-14 DIAGNOSIS — D509 Iron deficiency anemia, unspecified: Secondary | ICD-10-CM | POA: Diagnosis not present

## 2011-10-16 DIAGNOSIS — D509 Iron deficiency anemia, unspecified: Secondary | ICD-10-CM | POA: Diagnosis not present

## 2011-10-16 DIAGNOSIS — N186 End stage renal disease: Secondary | ICD-10-CM | POA: Diagnosis not present

## 2011-10-16 DIAGNOSIS — N2581 Secondary hyperparathyroidism of renal origin: Secondary | ICD-10-CM | POA: Diagnosis not present

## 2011-10-18 DIAGNOSIS — N2581 Secondary hyperparathyroidism of renal origin: Secondary | ICD-10-CM | POA: Diagnosis not present

## 2011-10-18 DIAGNOSIS — N186 End stage renal disease: Secondary | ICD-10-CM | POA: Diagnosis not present

## 2011-10-18 DIAGNOSIS — D509 Iron deficiency anemia, unspecified: Secondary | ICD-10-CM | POA: Diagnosis not present

## 2011-10-21 DIAGNOSIS — D509 Iron deficiency anemia, unspecified: Secondary | ICD-10-CM | POA: Diagnosis not present

## 2011-10-21 DIAGNOSIS — N2581 Secondary hyperparathyroidism of renal origin: Secondary | ICD-10-CM | POA: Diagnosis not present

## 2011-10-21 DIAGNOSIS — N186 End stage renal disease: Secondary | ICD-10-CM | POA: Diagnosis not present

## 2011-10-22 ENCOUNTER — Encounter (HOSPITAL_COMMUNITY): Payer: Self-pay | Admitting: Pharmacy Technician

## 2011-10-22 ENCOUNTER — Ambulatory Visit: Payer: Medicare Other | Admitting: Urgent Care

## 2011-10-23 DIAGNOSIS — N186 End stage renal disease: Secondary | ICD-10-CM | POA: Diagnosis not present

## 2011-10-23 DIAGNOSIS — N2581 Secondary hyperparathyroidism of renal origin: Secondary | ICD-10-CM | POA: Diagnosis not present

## 2011-10-23 DIAGNOSIS — D509 Iron deficiency anemia, unspecified: Secondary | ICD-10-CM | POA: Diagnosis not present

## 2011-10-25 DIAGNOSIS — N2581 Secondary hyperparathyroidism of renal origin: Secondary | ICD-10-CM | POA: Diagnosis not present

## 2011-10-25 DIAGNOSIS — D509 Iron deficiency anemia, unspecified: Secondary | ICD-10-CM | POA: Diagnosis not present

## 2011-10-25 DIAGNOSIS — N186 End stage renal disease: Secondary | ICD-10-CM | POA: Diagnosis not present

## 2011-10-27 DIAGNOSIS — N186 End stage renal disease: Secondary | ICD-10-CM | POA: Diagnosis not present

## 2011-10-28 DIAGNOSIS — N2581 Secondary hyperparathyroidism of renal origin: Secondary | ICD-10-CM | POA: Diagnosis not present

## 2011-10-28 DIAGNOSIS — N186 End stage renal disease: Secondary | ICD-10-CM | POA: Diagnosis not present

## 2011-10-28 DIAGNOSIS — D509 Iron deficiency anemia, unspecified: Secondary | ICD-10-CM | POA: Diagnosis not present

## 2011-10-28 MED ORDER — SODIUM CHLORIDE 0.45 % IV SOLN: Freq: Once | INTRAVENOUS | Status: DC

## 2011-10-29 ENCOUNTER — Encounter (HOSPITAL_COMMUNITY): Payer: Self-pay

## 2011-10-29 ENCOUNTER — Encounter (HOSPITAL_COMMUNITY)
Admission: RE | Disposition: A | Discharge status: Home / Self Care | Payer: Self-pay | Source: Ambulatory Visit | Attending: Internal Medicine

## 2011-10-29 ENCOUNTER — Ambulatory Visit (HOSPITAL_COMMUNITY)
Admission: RE | Admit: 2011-10-29 | Discharge: 2011-10-29 | Disposition: A | Discharge status: Home / Self Care | Payer: Medicare Other | Source: Ambulatory Visit | Attending: Internal Medicine | Admitting: Internal Medicine

## 2011-10-29 DIAGNOSIS — I10 Essential (primary) hypertension: Secondary | ICD-10-CM | POA: Diagnosis not present

## 2011-10-29 DIAGNOSIS — K921 Melena: Secondary | ICD-10-CM | POA: Diagnosis not present

## 2011-10-29 DIAGNOSIS — K5730 Diverticulosis of large intestine without perforation or abscess without bleeding: Secondary | ICD-10-CM

## 2011-10-29 DIAGNOSIS — K922 Gastrointestinal hemorrhage, unspecified: Secondary | ICD-10-CM

## 2011-10-29 DIAGNOSIS — Z860100 Personal history of colon polyps, unspecified: Secondary | ICD-10-CM | POA: Insufficient documentation

## 2011-10-29 DIAGNOSIS — D126 Benign neoplasm of colon, unspecified: Secondary | ICD-10-CM | POA: Diagnosis not present

## 2011-10-29 DIAGNOSIS — Z992 Dependence on renal dialysis: Secondary | ICD-10-CM | POA: Diagnosis not present

## 2011-10-29 DIAGNOSIS — N186 End stage renal disease: Secondary | ICD-10-CM | POA: Insufficient documentation

## 2011-10-29 DIAGNOSIS — Z1211 Encounter for screening for malignant neoplasm of colon: Secondary | ICD-10-CM

## 2011-10-29 HISTORY — PX: COLONOSCOPY: SHX5424

## 2011-10-29 SURGERY — COLONOSCOPY
Anesthesia: Moderate Sedation

## 2011-10-29 MED ORDER — MIDAZOLAM HCL 5 MG/5ML IJ SOLN
INTRAMUSCULAR | Status: AC
  Filled 2011-10-29: qty 10

## 2011-10-29 MED ORDER — MIDAZOLAM HCL 5 MG/5ML IJ SOLN
INTRAMUSCULAR | Status: DC | PRN
  Administered 2011-10-29 (×2): 1 mg via INTRAVENOUS
  Administered 2011-10-29: 2 mg via INTRAVENOUS

## 2011-10-29 MED ORDER — SODIUM CHLORIDE 0.9 % IV SOLN
INTRAVENOUS | Status: DC
  Administered 2011-10-29: 09:00:00 via INTRAVENOUS

## 2011-10-29 MED ORDER — MEPERIDINE HCL 100 MG/ML IJ SOLN
INTRAMUSCULAR | Status: DC | PRN
  Administered 2011-10-29: 50 mg via INTRAVENOUS

## 2011-10-29 MED ORDER — MEPERIDINE HCL 100 MG/ML IJ SOLN
INTRAMUSCULAR | Status: AC
  Filled 2011-10-29: qty 2

## 2011-10-29 NOTE — Op Note (Signed)
[facility] [address] [place], [place]  [address]  COLONOSCOPY PROCEDURE REPORT  PATIENT:  [name], [name]  MR#:  [id number] BIRTHDATE:  [birth year], 70 yrs. old  GENDER:  male ENDOSCOPIST:  R. Garfield Cornea, MD FACP [facility] REF. BY:  Conni Slipper, M.D. PROCEDURE DATE:  10/29/2011 PROCEDURE:  Colonoscopy with multiple snare polypectomies and biopsy  INDICATIONS:  Recent GI bleed. Distant history of colonic adenoma. See prior EGD report  INFORMED CONSENT:  The risks, benefits, alternatives and imponderables including but not limited to bleeding, perforation as well as the possibility of a missed lesion have been reviewed. The potential for biopsy, lesion removal, etc. have also been discussed.  Questions have been answered.  All parties agreeable. Please see the history and physical in the medical record for more information.  MEDICATIONS:  Versed 4 mg IV and Demerol 50 mg in divided dose  DESCRIPTION OF PROCEDURE:  After a digital rectal exam was performed, the [id number]) colonoscope was advanced from the anus through the rectum and colon to the area of the cecum, ileocecal valve and appendiceal orifice.  The cecum was deeply intubated.  These structures were well-seen and photographed for the record.  From the level of the cecum and ileocecal valve, the scope was slowly and cautiously withdrawn.  The mucosal surfaces were carefully surveyed utilizing scope tip deflection to facilitate fold flattening as needed.  The scope was pulled down into the rectum where a thorough examination including retroflexion was performed. <<PROCEDUREIMAGES>>  FINDINGS: Adequate preparation. Normal rectum. Scattered pancolonic diverticula (left sided greater than right). Multiple colonic polyps largest approximately 1 cm in the mid sigmoid segment. A 5 mm polyp in the ascendig segment. 3 motor polyp in the transverse segment. 3 mm area of polypoid mucosa in the base of the  cecum.  THERAPEUTIC / DIAGNOSTIC MANEUVERS PERFORMED:  The largest (sigmoid)  polyp was hot snare removed. Also, the ascending colon polyp was hot snare removed. Cecal polypoid mucosa was cold biopsied. The transverse colon polyp was cold biopsied/ removed. COMPLICATIONS:  None  CECAL WITHDRAWAL TIME: 9 minutes  IMPRESSION: Colonic diverticulosis. Multiple colonic polyps -- removed as described above.  RECOMMENDATIONS:   Followup on path pathology. Further recommendations to follow.  ______________________________ R. Garfield Cornea, MD Quentin Ore  CC:  Conni Slipper, M.D.  n. eSIGNED:   R. Legrand Como Arriah Wadle at 10/29/2011 10:29 AM  Elam Dutch, [id number]

## 2011-10-29 NOTE — H&P (View-Only) (Signed)
Primary Care Physician:  [name], MD, MD Primary Gastroenterologist:  Dr. Gala Romney  Chief Complaint  Patient presents with  . Colonoscopy   HPI:  [name] is a 71 y.o. male here for follow-up to set up colonoscopy. He was seen while hospitalized May 24 for melena. He underwent EGD at that time by Dr. 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 His hemoglobin drifted down to just below normal (12g). He denies any other significant GI complaints. His last colonoscopy was back in 2004 & he had a cecal polyp. He is due for colonoscopy at this time. He was seen in the emergency department last week with complaints of dark stools. His hemoglobin was 13.4.  He was sent home and has seen no further melena or rectal bleeding since then. He denies any lower GI symptoms including constipation, diarrhea, rectal bleeding, melena or weight loss.  Denies any upper GI symptoms including heartburn, indigestion, nausea, vomiting, dysphagia, odynophagia or anorexia. He has been off Plavix since hospital discharge.   Past Medical History  Diagnosis Date  . Hypertension   . ESRD (end stage renal disease) on dialysis   . CVA (cerebral infarction)   . Gastric ulcer 2004    treated for h.pylori see PSH  . LVH (left ventricular hypertrophy)     Past Surgical History  Procedure Date  . Cholecystectomy   . Arteriovenous graft placement   . Shoulder surgery   . Colonoscopy 2004    Dr. Irving Shows, left sided diverticula and cecal polyp, path unknown  . Esophagogastroduodenoscopy 11/2002    Dr. Gala Romney, erosive reflux esophagitis, multiple gastric ulcer and antral/bulbar erosions. Serologies positive for H.Pylori and was treated  . Esophagogastroduodenoscopy 11/20014    Dr. Gala Romney, small hh only, ulcers healed  . Esophagogastroduodenoscopy 09/21/2011    Dr Trevor Iha [facility], antral erosions, ?early GAVE    Current Outpatient Prescriptions  Medication Sig Dispense Refill    . ALPRAZolam (XANAX) 0.25 MG tablet Take 0.25 mg by mouth 3 (three) times daily.      Marland Kitchen amLODipine (NORVASC) 5 MG tablet Take 5 mg by mouth daily.      Marland Kitchen b complex-vitamin c-folic acid (NEPHRO-VITE) 0.8 MG TABS Take 0.8 mg by mouth every other day. Takes on Tuesdays, Thursdays, Saturdays, and Sundays. Does not take on Mondays, Wednesdays, and Fridays due to Dialysis treatments.      . cinacalcet (SENSIPAR) 60 MG tablet Take 60 mg by mouth every evening. With evening meal      . hydrOXYzine (ATARAX/VISTARIL) 10 MG tablet Take 10 mg by mouth as needed. For itching      . lidocaine-prilocaine (EMLA) cream Apply 1 application topically as needed. Apply to fistula 1 hour  Before treatment      . metoprolol succinate (TOPROL-XL) 50 MG 24 hr tablet Take 50 mg by mouth daily. Take with or immediately following a meal.      . pantoprazole (PROTONIX) 40 MG tablet Take 1 tablet (40 mg total) by mouth daily at 12 noon.  30 tablet  3  . promethazine (PHENERGAN) 25 MG tablet Take 12.5 mg by mouth Daily.      . sevelamer (RENVELA) 800 MG tablet Take 2,400-4,000 mg by mouth 3 (three) times daily with meals. Patient takes 5 tablets with meals and 3 tablets with snacks        Allergies as of 10/08/2011 - Review Complete 10/08/2011  Allergen Reaction Noted  . Aspirin  10/03/2011  Family History  Problem Relation Age of Onset  . Colon cancer Neg Hx   . Liver disease Neg Hx     History   Social History  . Marital Status: Single    Spouse Name: N/A    Number of Children: 2  . Years of Education: N/A   Occupational History  . retired, [employer]    Social History Main Topics  . Smoking status: Former Smoker    Quit date: 04/29/2004  . Smokeless tobacco: Not on file   Comment: quit 2006  . Alcohol Use: No  . Drug Use: No  . Sexually Active: Not on file   Other Topics Concern  . Not on file   Social History Narrative   Lives aloneDaughter 20-min away   Review of Systems: Gen: Denies any  fever, chills, sweats, anorexia, fatigue, weakness, malaise, weight loss, and sleep disorder CV: Denies chest pain, angina, palpitations, syncope, orthopnea, PND, peripheral edema, and claudication. Resp: Denies dyspnea at rest, dyspnea with exercise, cough, sputum, wheezing, coughing up blood, and pleurisy. GI: Denies vomiting blood, jaundice, and fecal incontinence.   Denies dysphagia or odynophagia. GU :On dialysis MS: Denies joint pain, limitation of movement, and swelling, stiffness, low back pain, extremity pain. Denies muscle weakness, cramps, atrophy.  Derm: Denies rash, itching, dry skin, hives, moles, warts, or unhealing ulcers.  Psych: Denies depression, anxiety, memory loss, suicidal ideation, hallucinations, paranoia, and confusion. Heme: Denies bruising, bleeding, and enlarged lymph nodes. Neuro:  Denies any headaches, dizziness, paresthesias. Endo:  Denies any problems with DM, thyroid, adrenal function.  Physical Exam: BP 118/66  Pulse 94  Temp(Src) 97.6 F (36.4 C) (Temporal)  Ht 5\' 6"  (1.676 m)  Wt 188 lb (85.276 kg)  BMI 30.34 kg/m2 General:   Alert,  Well-developed, well-nourished, pleasant and cooperative in NAD Head:  Normocephalic and atraumatic. Eyes:  Sclera clear, no icterus.   Conjunctiva pink. Ears:  Normal auditory acuity. Nose:  No deformity, discharge, or lesions. Mouth:  No deformity or lesions,oropharynx pink & moist.  Poor dentition. Neck:  Supple; no masses or thyromegaly. Lungs:  Clear throughout to auscultation.   No wheezes, crackles, or rhonchi. No acute distress. Heart:  Regular rate and rhythm; no murmurs, clicks, rubs,  or gallops. Abdomen:  Normal bowel sounds.  No bruits.  Soft, non-tender and non-distended without masses, hepatosplenomegaly or hernias noted.  No guarding or rebound tenderness.   Rectal:  Deferred. Msk:  Symmetrical without gross deformities. Normal posture. Pulses:  Normal pulses noted. Extremities:  + clubbing.  No  edema. Neurologic:  Alert and  oriented x4;  grossly normal neurologically. Skin:  Intact without significant lesions or rashes. Lymph Nodes:  No significant cervical adenopathy. Psych:  Alert and cooperative. Normal mood and affect.

## 2011-10-29 NOTE — Interval H&P Note (Signed)
History and Physical Interval Note:  10/29/2011 9:55 AM  [name]  has presented today for surgery, with the diagnosis of SCREENING COLONOSCOPY  The various methods of treatment have been discussed with the patient and family. After consideration of risks, benefits and other options for treatment, the patient has consented to  Procedure(s) (LRB): COLONOSCOPY (N/A) as a surgical intervention .  The patient's history has been reviewed, patient examined, no change in status, stable for surgery.  I have reviewed the patients' chart and labs.  Questions were answered to the patient's satisfaction.     Manus Rudd

## 2011-10-29 NOTE — Discharge Instructions (Addendum)
Colonoscopy Discharge Instructions  Read the instructions outlined below and refer to this sheet in the next few weeks. These discharge instructions provide you with general information on caring for yourself after you leave the hospital. Your doctor may also give you specific instructions. While your treatment has been planned according to the most current medical practices available, unavoidable complications occasionally occur. If you have any problems or questions after discharge, call Dr. Gala Romney at [phone]. ACTIVITY  You may resume your regular activity, but move at a slower pace for the next 24 hours.   Take frequent rest periods for the next 24 hours.   Walking will help get rid of the air and reduce the bloated feeling in your belly (abdomen).   No driving for 24 hours (because of the medicine (anesthesia) used during the test).    Do not sign any important legal documents or operate any machinery for 24 hours (because of the anesthesia used during the test).  NUTRITION  Drink plenty of fluids.   You may resume your normal diet as instructed by your doctor.   Begin with a light meal and progress to your normal diet. Heavy or fried foods are harder to digest and may make you feel sick to your stomach (nauseated).   Avoid alcoholic beverages for 24 hours or as instructed.  MEDICATIONS  You may resume your normal medications unless your doctor tells you otherwise.  WHAT YOU CAN EXPECT TODAY  Some feelings of bloating in the abdomen.   Passage of more gas than usual.   Spotting of blood in your stool or on the toilet paper.  IF YOU HAD POLYPS REMOVED DURING THE COLONOSCOPY:  No aspirin products for 7 days or as instructed.   No alcohol for 7 days or as instructed.   Eat a soft diet for the next 24 hours.  FINDING OUT THE RESULTS OF YOUR TEST Not all test results are available during your visit. If your test results are not back during the visit, make an appointment  with your caregiver to find out the results. Do not assume everything is normal if you have not heard from your caregiver or the medical facility. It is important for you to follow up on all of your test results.  SEEK IMMEDIATE MEDICAL ATTENTION IF:  You have more than a spotting of blood in your stool.   Your belly is swollen (abdominal distention).   You are nauseated or vomiting.   You have a temperature over 101.   You have abdominal pain or discomfort that is severe or gets worse throughout the day.    Diverticulosis and polyp information provided  Further recommendations to follow pending review of pathology report  Colon Polyps A polyp is extra tissue that grows inside your body. Colon polyps grow in the large intestine. The large intestine, also called the colon, is part of your digestive system. It is a long, hollow tube at the end of your digestive tract where your body makes and stores stool. Most polyps are not dangerous. They are benign. This means they are not cancerous. But over time, some types of polyps can turn into cancer. Polyps that are smaller than a pea are usually not harmful. But larger polyps could someday become or may already be cancerous. To be safe, doctors remove all polyps and test them.  WHO GETS POLYPS? Anyone can get polyps, but certain people are more likely than others. You may have a greater chance of getting polyps  if:  You are over 50.   You have had polyps before.   Someone in your family has had polyps.   Someone in your family has had cancer of the large intestine.   Find out if someone in your family has had polyps. You may also be more likely to get polyps if you:   Eat a lot of fatty foods.   Smoke.   Drink alcohol.   Do not exercise.   Eat too much.  SYMPTOMS  Most small polyps do not cause symptoms. People often do not know they have one until their caregiver finds it during a regular checkup or while testing them for  something else. Some people do have symptoms like these:  Bleeding from the anus. You might notice blood on your underwear or on toilet paper after you have had a bowel movement.   Constipation or diarrhea that lasts more than a week.   Blood in the stool. Blood can make stool look black or it can show up as red streaks in the stool.  If you have any of these symptoms, see your caregiver. HOW DOES THE DOCTOR TEST FOR POLYPS? The doctor can use four tests to check for polyps:  Digital rectal exam. The caregiver wears gloves and checks your rectum (the last part of the large intestine) to see if it feels normal. This test would find polyps only in the rectum. Your caregiver may need to do one of the other tests listed below to find polyps higher up in the intestine.   Barium enema. The caregiver puts a liquid called barium into your rectum before taking x-rays of your large intestine. Barium makes your intestine look white in the pictures. Polyps are dark, so they are easy to see.   Sigmoidoscopy. With this test, the caregiver can see inside your large intestine. A thin flexible tube is placed into your rectum. The device is called a sigmoidoscope, which has a light and a tiny video camera in it. The caregiver uses the sigmoidoscope to look at the last third of your large intestine.   Colonoscopy. This test is like sigmoidoscopy, but the caregiver looks at all of the large intestine. It usually requires sedation. This is the most common method for finding and removing polyps.  TREATMENT   The caregiver will remove the polyp during sigmoidoscopy or colonoscopy. The polyp is then tested for cancer.   If you have had polyps, your caregiver may want you to get tested regularly in the future.  PREVENTION  There is not one sure way to prevent polyps. You might be able to lower your risk of getting them if you:  Eat more fruits and vegetables and less fatty food.   Do not smoke.   Avoid  alcohol.   Exercise every day.   Lose weight if you are overweight.   Eating more calcium and folate can also lower your risk of getting polyps. Some foods that are rich in calcium are milk, cheese, and broccoli. Some foods that are rich in folate are chickpeas, kidney beans, and spinach.   Aspirin might help prevent polyps. Studies are under way.    Diverticulosis Diverticulosis is a common condition that develops when small pouches (diverticula) form in the wall of the colon. The risk of diverticulosis increases with age. It happens more often in people who eat a low-fiber diet. Most individuals with diverticulosis have no symptoms. Those individuals with symptoms usually experience abdominal pain, constipation, or loose stools (  diarrhea). HOME CARE INSTRUCTIONS   Increase the amount of fiber in your diet as directed by your caregiver or dietician. This may reduce symptoms of diverticulosis.   Your caregiver may recommend taking a dietary fiber supplement.   Drink at least 6 to 8 glasses of water each day to prevent constipation.   Try not to strain when you have a bowel movement.   Your caregiver may recommend avoiding nuts and seeds to prevent complications, although this is still an uncertain benefit.   Only take over-the-counter or prescription medicines for pain, discomfort, or fever as directed by your caregiver.  FOODS WITH HIGH FIBER CONTENT INCLUDE:  Fruits. Apple, peach, pear, tangerine, raisins, prunes.   Vegetables. Brussels sprouts, asparagus, broccoli, cabbage, carrot, cauliflower, romaine lettuce, spinach, summer squash, tomato, winter squash, zucchini.   Starchy Vegetables. Baked beans, kidney beans, lima beans, split peas, lentils, potatoes (with skin).   Grains. Whole wheat bread, brown rice, bran flake cereal, plain oatmeal, white rice, shredded wheat, bran muffins.  SEEK IMMEDIATE MEDICAL CARE IF:   You develop increasing pain or severe bloating.   You  have an oral temperature above 102 F (38.9 C), not controlled by medicine.   You develop vomiting or bowel movements that are bloody or black.  Document Released: 01/11/2004 Document Revised: 04/04/2011 Document Reviewed: 09/13/2009 [facility] Patient Information 2012 [place].

## 2011-10-30 ENCOUNTER — Encounter: Payer: Self-pay | Admitting: Internal Medicine

## 2011-10-30 DIAGNOSIS — N2581 Secondary hyperparathyroidism of renal origin: Secondary | ICD-10-CM | POA: Diagnosis not present

## 2011-10-30 DIAGNOSIS — N186 End stage renal disease: Secondary | ICD-10-CM | POA: Diagnosis not present

## 2011-10-30 DIAGNOSIS — D509 Iron deficiency anemia, unspecified: Secondary | ICD-10-CM | POA: Diagnosis not present

## 2011-11-01 DIAGNOSIS — D509 Iron deficiency anemia, unspecified: Secondary | ICD-10-CM | POA: Diagnosis not present

## 2011-11-01 DIAGNOSIS — N186 End stage renal disease: Secondary | ICD-10-CM | POA: Diagnosis not present

## 2011-11-01 DIAGNOSIS — N2581 Secondary hyperparathyroidism of renal origin: Secondary | ICD-10-CM | POA: Diagnosis not present

## 2011-11-04 ENCOUNTER — Encounter (HOSPITAL_COMMUNITY): Payer: Self-pay | Admitting: Internal Medicine

## 2011-11-04 DIAGNOSIS — N186 End stage renal disease: Secondary | ICD-10-CM | POA: Diagnosis not present

## 2011-11-04 DIAGNOSIS — D509 Iron deficiency anemia, unspecified: Secondary | ICD-10-CM | POA: Diagnosis not present

## 2011-11-04 DIAGNOSIS — N2581 Secondary hyperparathyroidism of renal origin: Secondary | ICD-10-CM | POA: Diagnosis not present

## 2011-11-06 DIAGNOSIS — N186 End stage renal disease: Secondary | ICD-10-CM | POA: Diagnosis not present

## 2011-11-06 DIAGNOSIS — D509 Iron deficiency anemia, unspecified: Secondary | ICD-10-CM | POA: Diagnosis not present

## 2011-11-06 DIAGNOSIS — N2581 Secondary hyperparathyroidism of renal origin: Secondary | ICD-10-CM | POA: Diagnosis not present

## 2011-11-08 DIAGNOSIS — N2581 Secondary hyperparathyroidism of renal origin: Secondary | ICD-10-CM | POA: Diagnosis not present

## 2011-11-08 DIAGNOSIS — D509 Iron deficiency anemia, unspecified: Secondary | ICD-10-CM | POA: Diagnosis not present

## 2011-11-08 DIAGNOSIS — N186 End stage renal disease: Secondary | ICD-10-CM | POA: Diagnosis not present

## 2011-11-11 DIAGNOSIS — D509 Iron deficiency anemia, unspecified: Secondary | ICD-10-CM | POA: Diagnosis not present

## 2011-11-11 DIAGNOSIS — N186 End stage renal disease: Secondary | ICD-10-CM | POA: Diagnosis not present

## 2011-11-11 DIAGNOSIS — N2581 Secondary hyperparathyroidism of renal origin: Secondary | ICD-10-CM | POA: Diagnosis not present

## 2011-11-13 DIAGNOSIS — N186 End stage renal disease: Secondary | ICD-10-CM | POA: Diagnosis not present

## 2011-11-13 DIAGNOSIS — N2581 Secondary hyperparathyroidism of renal origin: Secondary | ICD-10-CM | POA: Diagnosis not present

## 2011-11-13 DIAGNOSIS — D509 Iron deficiency anemia, unspecified: Secondary | ICD-10-CM | POA: Diagnosis not present

## 2011-11-15 DIAGNOSIS — D509 Iron deficiency anemia, unspecified: Secondary | ICD-10-CM | POA: Diagnosis not present

## 2011-11-15 DIAGNOSIS — N2581 Secondary hyperparathyroidism of renal origin: Secondary | ICD-10-CM | POA: Diagnosis not present

## 2011-11-15 DIAGNOSIS — N186 End stage renal disease: Secondary | ICD-10-CM | POA: Diagnosis not present

## 2011-11-18 DIAGNOSIS — N2581 Secondary hyperparathyroidism of renal origin: Secondary | ICD-10-CM | POA: Diagnosis not present

## 2011-11-18 DIAGNOSIS — D509 Iron deficiency anemia, unspecified: Secondary | ICD-10-CM | POA: Diagnosis not present

## 2011-11-18 DIAGNOSIS — N186 End stage renal disease: Secondary | ICD-10-CM | POA: Diagnosis not present

## 2011-11-20 DIAGNOSIS — N186 End stage renal disease: Secondary | ICD-10-CM | POA: Diagnosis not present

## 2011-11-20 DIAGNOSIS — N2581 Secondary hyperparathyroidism of renal origin: Secondary | ICD-10-CM | POA: Diagnosis not present

## 2011-11-20 DIAGNOSIS — D509 Iron deficiency anemia, unspecified: Secondary | ICD-10-CM | POA: Diagnosis not present

## 2011-11-22 DIAGNOSIS — D509 Iron deficiency anemia, unspecified: Secondary | ICD-10-CM | POA: Diagnosis not present

## 2011-11-22 DIAGNOSIS — N2581 Secondary hyperparathyroidism of renal origin: Secondary | ICD-10-CM | POA: Diagnosis not present

## 2011-11-22 DIAGNOSIS — N186 End stage renal disease: Secondary | ICD-10-CM | POA: Diagnosis not present

## 2011-11-25 DIAGNOSIS — D509 Iron deficiency anemia, unspecified: Secondary | ICD-10-CM | POA: Diagnosis not present

## 2011-11-25 DIAGNOSIS — N186 End stage renal disease: Secondary | ICD-10-CM | POA: Diagnosis not present

## 2011-11-25 DIAGNOSIS — N2581 Secondary hyperparathyroidism of renal origin: Secondary | ICD-10-CM | POA: Diagnosis not present

## 2011-11-27 DIAGNOSIS — N186 End stage renal disease: Secondary | ICD-10-CM | POA: Diagnosis not present

## 2011-11-27 DIAGNOSIS — N2581 Secondary hyperparathyroidism of renal origin: Secondary | ICD-10-CM | POA: Diagnosis not present

## 2011-11-27 DIAGNOSIS — D509 Iron deficiency anemia, unspecified: Secondary | ICD-10-CM | POA: Diagnosis not present

## 2011-11-28 ENCOUNTER — Other Ambulatory Visit: Payer: Self-pay

## 2011-11-28 DIAGNOSIS — I10 Essential (primary) hypertension: Secondary | ICD-10-CM | POA: Diagnosis not present

## 2011-11-28 DIAGNOSIS — N19 Unspecified kidney failure: Secondary | ICD-10-CM | POA: Diagnosis not present

## 2011-11-28 DIAGNOSIS — J40 Bronchitis, not specified as acute or chronic: Secondary | ICD-10-CM

## 2011-11-28 DIAGNOSIS — J410 Simple chronic bronchitis: Secondary | ICD-10-CM | POA: Diagnosis not present

## 2011-11-28 NOTE — Progress Notes (Signed)
REVIEWED.  

## 2011-11-29 DIAGNOSIS — D509 Iron deficiency anemia, unspecified: Secondary | ICD-10-CM | POA: Diagnosis not present

## 2011-11-29 DIAGNOSIS — N2581 Secondary hyperparathyroidism of renal origin: Secondary | ICD-10-CM | POA: Diagnosis not present

## 2011-11-29 DIAGNOSIS — L89899 Pressure ulcer of other site, unspecified stage: Secondary | ICD-10-CM | POA: Diagnosis not present

## 2011-11-29 DIAGNOSIS — N186 End stage renal disease: Secondary | ICD-10-CM | POA: Diagnosis not present

## 2011-12-02 DIAGNOSIS — L89899 Pressure ulcer of other site, unspecified stage: Secondary | ICD-10-CM | POA: Diagnosis not present

## 2011-12-02 DIAGNOSIS — N2581 Secondary hyperparathyroidism of renal origin: Secondary | ICD-10-CM | POA: Diagnosis not present

## 2011-12-02 DIAGNOSIS — N186 End stage renal disease: Secondary | ICD-10-CM | POA: Diagnosis not present

## 2011-12-02 DIAGNOSIS — D509 Iron deficiency anemia, unspecified: Secondary | ICD-10-CM | POA: Diagnosis not present

## 2011-12-04 DIAGNOSIS — N186 End stage renal disease: Secondary | ICD-10-CM | POA: Diagnosis not present

## 2011-12-04 DIAGNOSIS — D509 Iron deficiency anemia, unspecified: Secondary | ICD-10-CM | POA: Diagnosis not present

## 2011-12-04 DIAGNOSIS — L89899 Pressure ulcer of other site, unspecified stage: Secondary | ICD-10-CM | POA: Diagnosis not present

## 2011-12-04 DIAGNOSIS — N2581 Secondary hyperparathyroidism of renal origin: Secondary | ICD-10-CM | POA: Diagnosis not present

## 2011-12-05 ENCOUNTER — Ambulatory Visit (HOSPITAL_COMMUNITY)
Admission: RE | Admit: 2011-12-05 | Discharge: 2011-12-05 | Disposition: A | Discharge status: Home / Self Care | Payer: Medicare Other | Source: Ambulatory Visit | Attending: Internal Medicine | Admitting: Internal Medicine

## 2011-12-05 DIAGNOSIS — R0609 Other forms of dyspnea: Secondary | ICD-10-CM | POA: Diagnosis not present

## 2011-12-05 DIAGNOSIS — R0989 Other specified symptoms and signs involving the circulatory and respiratory systems: Secondary | ICD-10-CM | POA: Insufficient documentation

## 2011-12-05 MED ORDER — ALBUTEROL SULFATE (5 MG/ML) 0.5% IN NEBU
2.5000 mg | INHALATION_SOLUTION | Freq: Once | RESPIRATORY_TRACT | Status: AC
  Administered 2011-12-05: 2.5 mg via RESPIRATORY_TRACT

## 2011-12-06 DIAGNOSIS — N186 End stage renal disease: Secondary | ICD-10-CM | POA: Diagnosis not present

## 2011-12-06 DIAGNOSIS — D509 Iron deficiency anemia, unspecified: Secondary | ICD-10-CM | POA: Diagnosis not present

## 2011-12-06 DIAGNOSIS — N2581 Secondary hyperparathyroidism of renal origin: Secondary | ICD-10-CM | POA: Diagnosis not present

## 2011-12-06 DIAGNOSIS — L89899 Pressure ulcer of other site, unspecified stage: Secondary | ICD-10-CM | POA: Diagnosis not present

## 2011-12-09 DIAGNOSIS — L89899 Pressure ulcer of other site, unspecified stage: Secondary | ICD-10-CM | POA: Diagnosis not present

## 2011-12-09 DIAGNOSIS — N186 End stage renal disease: Secondary | ICD-10-CM | POA: Diagnosis not present

## 2011-12-09 DIAGNOSIS — N2581 Secondary hyperparathyroidism of renal origin: Secondary | ICD-10-CM | POA: Diagnosis not present

## 2011-12-09 DIAGNOSIS — D509 Iron deficiency anemia, unspecified: Secondary | ICD-10-CM | POA: Diagnosis not present

## 2011-12-11 DIAGNOSIS — L89899 Pressure ulcer of other site, unspecified stage: Secondary | ICD-10-CM | POA: Diagnosis not present

## 2011-12-11 DIAGNOSIS — N2581 Secondary hyperparathyroidism of renal origin: Secondary | ICD-10-CM | POA: Diagnosis not present

## 2011-12-11 DIAGNOSIS — D509 Iron deficiency anemia, unspecified: Secondary | ICD-10-CM | POA: Diagnosis not present

## 2011-12-11 DIAGNOSIS — N186 End stage renal disease: Secondary | ICD-10-CM | POA: Diagnosis not present

## 2011-12-13 DIAGNOSIS — D509 Iron deficiency anemia, unspecified: Secondary | ICD-10-CM | POA: Diagnosis not present

## 2011-12-13 DIAGNOSIS — L89899 Pressure ulcer of other site, unspecified stage: Secondary | ICD-10-CM | POA: Diagnosis not present

## 2011-12-13 DIAGNOSIS — N2581 Secondary hyperparathyroidism of renal origin: Secondary | ICD-10-CM | POA: Diagnosis not present

## 2011-12-13 DIAGNOSIS — N186 End stage renal disease: Secondary | ICD-10-CM | POA: Diagnosis not present

## 2011-12-16 DIAGNOSIS — N186 End stage renal disease: Secondary | ICD-10-CM | POA: Diagnosis not present

## 2011-12-16 DIAGNOSIS — D509 Iron deficiency anemia, unspecified: Secondary | ICD-10-CM | POA: Diagnosis not present

## 2011-12-16 DIAGNOSIS — L89899 Pressure ulcer of other site, unspecified stage: Secondary | ICD-10-CM | POA: Diagnosis not present

## 2011-12-16 DIAGNOSIS — N2581 Secondary hyperparathyroidism of renal origin: Secondary | ICD-10-CM | POA: Diagnosis not present

## 2011-12-18 DIAGNOSIS — N186 End stage renal disease: Secondary | ICD-10-CM | POA: Diagnosis not present

## 2011-12-18 DIAGNOSIS — N2581 Secondary hyperparathyroidism of renal origin: Secondary | ICD-10-CM | POA: Diagnosis not present

## 2011-12-18 DIAGNOSIS — D509 Iron deficiency anemia, unspecified: Secondary | ICD-10-CM | POA: Diagnosis not present

## 2011-12-18 DIAGNOSIS — L89899 Pressure ulcer of other site, unspecified stage: Secondary | ICD-10-CM | POA: Diagnosis not present

## 2011-12-20 DIAGNOSIS — N2581 Secondary hyperparathyroidism of renal origin: Secondary | ICD-10-CM | POA: Diagnosis not present

## 2011-12-20 DIAGNOSIS — D509 Iron deficiency anemia, unspecified: Secondary | ICD-10-CM | POA: Diagnosis not present

## 2011-12-20 DIAGNOSIS — L89899 Pressure ulcer of other site, unspecified stage: Secondary | ICD-10-CM | POA: Diagnosis not present

## 2011-12-20 DIAGNOSIS — N186 End stage renal disease: Secondary | ICD-10-CM | POA: Diagnosis not present

## 2011-12-23 DIAGNOSIS — L89899 Pressure ulcer of other site, unspecified stage: Secondary | ICD-10-CM | POA: Diagnosis not present

## 2011-12-23 DIAGNOSIS — N186 End stage renal disease: Secondary | ICD-10-CM | POA: Diagnosis not present

## 2011-12-23 DIAGNOSIS — D509 Iron deficiency anemia, unspecified: Secondary | ICD-10-CM | POA: Diagnosis not present

## 2011-12-23 DIAGNOSIS — N2581 Secondary hyperparathyroidism of renal origin: Secondary | ICD-10-CM | POA: Diagnosis not present

## 2011-12-24 DIAGNOSIS — I739 Peripheral vascular disease, unspecified: Secondary | ICD-10-CM | POA: Diagnosis not present

## 2011-12-24 DIAGNOSIS — R209 Unspecified disturbances of skin sensation: Secondary | ICD-10-CM | POA: Diagnosis not present

## 2011-12-24 DIAGNOSIS — I999 Unspecified disorder of circulatory system: Secondary | ICD-10-CM | POA: Diagnosis not present

## 2011-12-24 DIAGNOSIS — Z992 Dependence on renal dialysis: Secondary | ICD-10-CM | POA: Diagnosis not present

## 2011-12-24 DIAGNOSIS — L97509 Non-pressure chronic ulcer of other part of unspecified foot with unspecified severity: Secondary | ICD-10-CM | POA: Diagnosis not present

## 2011-12-24 DIAGNOSIS — N186 End stage renal disease: Secondary | ICD-10-CM | POA: Diagnosis not present

## 2011-12-24 DIAGNOSIS — L84 Corns and callosities: Secondary | ICD-10-CM | POA: Diagnosis not present

## 2011-12-24 DIAGNOSIS — S90129A Contusion of unspecified lesser toe(s) without damage to nail, initial encounter: Secondary | ICD-10-CM | POA: Diagnosis not present

## 2011-12-24 DIAGNOSIS — S98139A Complete traumatic amputation of one unspecified lesser toe, initial encounter: Secondary | ICD-10-CM | POA: Diagnosis not present

## 2011-12-25 ENCOUNTER — Other Ambulatory Visit: Payer: Self-pay

## 2011-12-25 DIAGNOSIS — D509 Iron deficiency anemia, unspecified: Secondary | ICD-10-CM | POA: Diagnosis not present

## 2011-12-25 DIAGNOSIS — N186 End stage renal disease: Secondary | ICD-10-CM | POA: Diagnosis not present

## 2011-12-25 DIAGNOSIS — N2581 Secondary hyperparathyroidism of renal origin: Secondary | ICD-10-CM | POA: Diagnosis not present

## 2011-12-25 DIAGNOSIS — L89899 Pressure ulcer of other site, unspecified stage: Secondary | ICD-10-CM | POA: Diagnosis not present

## 2011-12-25 DIAGNOSIS — L97509 Non-pressure chronic ulcer of other part of unspecified foot with unspecified severity: Secondary | ICD-10-CM

## 2011-12-25 DIAGNOSIS — I739 Peripheral vascular disease, unspecified: Secondary | ICD-10-CM

## 2011-12-26 NOTE — Procedures (Signed)
[name], [name]                   ACCOUNT NO.:  [id number]  MEDICAL RECORD NO.:  [id number]  LOCATION:                                 FACILITY:  PHYSICIAN:  Shyanne Mcclary L. Luan Pulling, M.D.DATE OF BIRTH:  23-Jun-1940  DATE OF PROCEDURE:  12/25/2011 DATE OF DISCHARGE:                           PULMONARY FUNCTION TEST   Reason for pulmonary function testing is bronchitis.  1. Spirometry shows a mild ventilatory defect with evidence of airflow     obstruction. 2. Lung volumes show air trapping. 3. DLCO is normal. 4. Airway resistance is elevated confirming the presence of airflow     obstruction. 5. There is no significant bronchodilator improvement. 6. Based on the patient's pulmonary function testing and smoking     history, this is consistent with COPD.     [name] L. Luan Pulling, M.D.     ELH/MEDQ  D:  12/25/2011  T:  12/25/2011  Job:  [id number]  cc:   Tesfaye D. Legrand Rams, MD Fax: [phone]

## 2011-12-27 ENCOUNTER — Other Ambulatory Visit: Payer: Self-pay

## 2011-12-27 DIAGNOSIS — L97509 Non-pressure chronic ulcer of other part of unspecified foot with unspecified severity: Secondary | ICD-10-CM | POA: Diagnosis not present

## 2011-12-27 DIAGNOSIS — I739 Peripheral vascular disease, unspecified: Secondary | ICD-10-CM

## 2011-12-27 DIAGNOSIS — Z992 Dependence on renal dialysis: Secondary | ICD-10-CM | POA: Diagnosis not present

## 2011-12-27 DIAGNOSIS — N2581 Secondary hyperparathyroidism of renal origin: Secondary | ICD-10-CM | POA: Diagnosis not present

## 2011-12-27 DIAGNOSIS — N186 End stage renal disease: Secondary | ICD-10-CM | POA: Diagnosis not present

## 2011-12-27 DIAGNOSIS — L97409 Non-pressure chronic ulcer of unspecified heel and midfoot with unspecified severity: Secondary | ICD-10-CM | POA: Diagnosis not present

## 2011-12-27 DIAGNOSIS — I120 Hypertensive chronic kidney disease with stage 5 chronic kidney disease or end stage renal disease: Secondary | ICD-10-CM | POA: Diagnosis not present

## 2011-12-27 DIAGNOSIS — D509 Iron deficiency anemia, unspecified: Secondary | ICD-10-CM | POA: Diagnosis not present

## 2011-12-27 DIAGNOSIS — S98139A Complete traumatic amputation of one unspecified lesser toe, initial encounter: Secondary | ICD-10-CM | POA: Diagnosis not present

## 2011-12-27 DIAGNOSIS — L89899 Pressure ulcer of other site, unspecified stage: Secondary | ICD-10-CM | POA: Diagnosis not present

## 2011-12-27 DIAGNOSIS — R209 Unspecified disturbances of skin sensation: Secondary | ICD-10-CM | POA: Diagnosis not present

## 2011-12-27 DIAGNOSIS — I999 Unspecified disorder of circulatory system: Secondary | ICD-10-CM | POA: Diagnosis not present

## 2011-12-27 LAB — PULMONARY FUNCTION TEST

## 2011-12-28 DIAGNOSIS — N186 End stage renal disease: Secondary | ICD-10-CM | POA: Diagnosis not present

## 2011-12-30 DIAGNOSIS — N2581 Secondary hyperparathyroidism of renal origin: Secondary | ICD-10-CM | POA: Diagnosis not present

## 2011-12-30 DIAGNOSIS — N186 End stage renal disease: Secondary | ICD-10-CM | POA: Diagnosis not present

## 2011-12-30 DIAGNOSIS — Z23 Encounter for immunization: Secondary | ICD-10-CM | POA: Diagnosis not present

## 2011-12-30 DIAGNOSIS — D509 Iron deficiency anemia, unspecified: Secondary | ICD-10-CM | POA: Diagnosis not present

## 2011-12-30 DIAGNOSIS — L89899 Pressure ulcer of other site, unspecified stage: Secondary | ICD-10-CM | POA: Diagnosis not present

## 2011-12-31 DIAGNOSIS — L84 Corns and callosities: Secondary | ICD-10-CM | POA: Diagnosis not present

## 2011-12-31 DIAGNOSIS — I739 Peripheral vascular disease, unspecified: Secondary | ICD-10-CM | POA: Diagnosis not present

## 2011-12-31 DIAGNOSIS — N186 End stage renal disease: Secondary | ICD-10-CM | POA: Diagnosis not present

## 2011-12-31 DIAGNOSIS — Z992 Dependence on renal dialysis: Secondary | ICD-10-CM | POA: Diagnosis not present

## 2011-12-31 DIAGNOSIS — G5790 Unspecified mononeuropathy of unspecified lower limb: Secondary | ICD-10-CM | POA: Diagnosis not present

## 2011-12-31 DIAGNOSIS — L98499 Non-pressure chronic ulcer of skin of other sites with unspecified severity: Secondary | ICD-10-CM | POA: Diagnosis not present

## 2011-12-31 DIAGNOSIS — L97509 Non-pressure chronic ulcer of other part of unspecified foot with unspecified severity: Secondary | ICD-10-CM | POA: Diagnosis not present

## 2012-01-01 DIAGNOSIS — N186 End stage renal disease: Secondary | ICD-10-CM | POA: Diagnosis not present

## 2012-01-09 DIAGNOSIS — L97509 Non-pressure chronic ulcer of other part of unspecified foot with unspecified severity: Secondary | ICD-10-CM | POA: Diagnosis not present

## 2012-01-09 DIAGNOSIS — S90129A Contusion of unspecified lesser toe(s) without damage to nail, initial encounter: Secondary | ICD-10-CM | POA: Diagnosis not present

## 2012-01-09 DIAGNOSIS — N186 End stage renal disease: Secondary | ICD-10-CM | POA: Diagnosis not present

## 2012-01-09 DIAGNOSIS — G5790 Unspecified mononeuropathy of unspecified lower limb: Secondary | ICD-10-CM | POA: Diagnosis not present

## 2012-01-09 DIAGNOSIS — Z992 Dependence on renal dialysis: Secondary | ICD-10-CM | POA: Diagnosis not present

## 2012-01-09 DIAGNOSIS — L84 Corns and callosities: Secondary | ICD-10-CM | POA: Diagnosis not present

## 2012-01-09 DIAGNOSIS — I739 Peripheral vascular disease, unspecified: Secondary | ICD-10-CM | POA: Diagnosis not present

## 2012-01-09 DIAGNOSIS — L98499 Non-pressure chronic ulcer of skin of other sites with unspecified severity: Secondary | ICD-10-CM | POA: Diagnosis not present

## 2012-01-10 ENCOUNTER — Ambulatory Visit: Payer: Medicare Other | Admitting: Vascular Surgery

## 2012-01-15 ENCOUNTER — Encounter: Payer: Self-pay | Admitting: Vascular Surgery

## 2012-01-16 ENCOUNTER — Encounter: Payer: Self-pay | Admitting: Vascular Surgery

## 2012-01-16 ENCOUNTER — Other Ambulatory Visit: Payer: Self-pay

## 2012-01-16 ENCOUNTER — Ambulatory Visit (INDEPENDENT_AMBULATORY_CARE_PROVIDER_SITE_OTHER): Payer: Medicare Other | Admitting: Vascular Surgery

## 2012-01-16 ENCOUNTER — Encounter (INDEPENDENT_AMBULATORY_CARE_PROVIDER_SITE_OTHER): Payer: Medicare Other | Admitting: *Deleted

## 2012-01-16 ENCOUNTER — Ambulatory Visit (INDEPENDENT_AMBULATORY_CARE_PROVIDER_SITE_OTHER): Payer: Medicare Other | Admitting: *Deleted

## 2012-01-16 VITALS — BP 143/86 | HR 87 | Temp 98.1°F | Ht 66.0 in | Wt 183.0 lb

## 2012-01-16 DIAGNOSIS — L97409 Non-pressure chronic ulcer of unspecified heel and midfoot with unspecified severity: Secondary | ICD-10-CM

## 2012-01-16 DIAGNOSIS — L97509 Non-pressure chronic ulcer of other part of unspecified foot with unspecified severity: Secondary | ICD-10-CM

## 2012-01-16 DIAGNOSIS — Z48812 Encounter for surgical aftercare following surgery on the circulatory system: Secondary | ICD-10-CM

## 2012-01-16 DIAGNOSIS — L97411 Non-pressure chronic ulcer of right heel and midfoot limited to breakdown of skin: Secondary | ICD-10-CM | POA: Insufficient documentation

## 2012-01-16 DIAGNOSIS — I739 Peripheral vascular disease, unspecified: Secondary | ICD-10-CM

## 2012-01-16 NOTE — Progress Notes (Signed)
[facility] [place] HISTORY AND PHYSICAL   History of Present Illness:  Patient is a 71 y.o. year old male who presents for evaluation of a nonhealing ulcer right foot.  The patient previously underwent atherectomy and angioplasty of his anterior tibial artery by Dr. Bridgett Larsson in February of 2012.  He now returns referred by Dr. Nils Pyle for a poorly healing wound of his right lateral foot. He denies rest pain. The patient is on hemodialysis and dialyzes Monday Wednesday and Friday at [facility]. Other medical problems include hypertension, peptic ulcer disease, prior stroke. These are all currently stable.  Past Medical History  Diagnosis Date  . Hypertension   . ESRD (end stage renal disease) on dialysis   . CVA (cerebral infarction)   . Gastric ulcer 2004    treated for h.pylori see PSH  . LVH (left ventricular hypertrophy)   . Stroke     Past Surgical History  Procedure Date  . Cholecystectomy   . Arteriovenous graft placement   . Shoulder surgery   . Colonoscopy 2004    Dr. Irving Shows, left sided diverticula and cecal polyp, path unknown  . Esophagogastroduodenoscopy 11/2002    Dr. Gala Romney, erosive reflux esophagitis, multiple gastric ulcer and antral/bulbar erosions. Serologies positive for H.Pylori and was treated  . Esophagogastroduodenoscopy 11/20014    Dr. Gala Romney, small hh only, ulcers healed  . Esophagogastroduodenoscopy 09/21/2011    Dr Trevor Iha [facility], antral erosions, ?early GAVE  . Colonoscopy 10/29/2011    Procedure: COLONOSCOPY;  Surgeon: Daneil Dolin, MD;  [facility];  Service: Endoscopy;  Laterality: N/A;  10:15     Social History History  Substance Use Topics  . Smoking status: Former Smoker    Quit date: 04/29/2004  . Smokeless tobacco: Former Systems developer    Types: [place] date: 01/16/1987   Comment: quit 2006  . Alcohol Use: No    Family History Family History  Problem Relation Age of Onset  . Colon cancer Neg Hx   .  Liver disease Neg Hx     Allergies  Allergies  Allergen Reactions  . Aspirin     Causes internal bleeding      Current Outpatient Prescriptions  Medication Sig Dispense Refill  . ALPRAZolam (XANAX) 0.25 MG tablet Take 0.25 mg by mouth 3 (three) times daily.      Marland Kitchen amLODipine (NORVASC) 5 MG tablet Take 5 mg by mouth daily.      Marland Kitchen b complex-vitamin c-folic acid (NEPHRO-VITE) 0.8 MG TABS Take 0.8 mg by mouth as directed. Takes on Tuesdays, Thursdays, Saturdays, and Sundays. Does not take on Mondays, Wednesdays, and Fridays due to Dialysis treatments.      . cinacalcet (SENSIPAR) 60 MG tablet Take 60 mg by mouth every evening. With evening meal      . hydrOXYzine (ATARAX/VISTARIL) 10 MG tablet Take 10 mg by mouth as needed. For itching      . metoprolol succinate (TOPROL-XL) 50 MG 24 hr tablet Take 50 mg by mouth daily. Take with or immediately following a meal.      . promethazine (PHENERGAN) 25 MG tablet Take 12.5 mg by mouth Daily.      . sevelamer (RENVELA) 800 MG tablet Take 2,400-4,000 mg by mouth 3 (three) times daily with meals. Patient takes 5 tablets with meals and 3 tablets with snacks        ROS:   General:  No weight loss, Fever, chills  HEENT: No  recent headaches, no nasal bleeding, no visual changes, no sore throat  Neurologic: No dizziness, blackouts, seizures. No recent symptoms of stroke or mini- stroke. No recent episodes of slurred speech, or temporary blindness.  Cardiac: No recent episodes of chest pain/pressure, no shortness of breath at rest.  No shortness of breath with exertion.  Denies history of atrial fibrillation or irregular heartbeat  Vascular: No history of rest pain in feet.  No history of claudication.  + history of non-healing ulcer, No history of DVT   Pulmonary: No home oxygen, no productive cough, no hemoptysis,  No asthma or wheezing  Musculoskeletal:  [ ]  Arthritis, [ ]  Low back pain,  [ ]  Joint pain  Hematologic:No history of  hypercoagulable state.  No history of easy bleeding.  No history of anemia  Gastrointestinal: No hematochezia or melena,  No gastroesophageal reflux, no trouble swallowing  Urinary: [ ]  chronic Kidney disease, [ ]  on HD - [ ]  MWF or [ ]  TTHS, [ ]  Burning with urination, [ ]  Frequent urination, [ ]  Difficulty urinating;   Skin: No rashes  Psychological: No history of anxiety,  No history of depression   Physical Examination  Filed Vitals:   01/16/12 1218  BP: 143/86  Pulse: 87  Temp: 98.1 F (36.7 C)  TempSrc: Oral  Height: 5\' 6"  (1.676 m)  Weight: 183 lb (83.008 kg)  SpO2: 98%    Body mass index is 29.54 kg/(m^2).  General:  Alert and oriented, no acute distress HEENT: Normal Neck: No bruit or JVD Pulmonary: Clear to auscultation bilaterally Cardiac: Regular Rate and Rhythm without murmur Abdomen: Soft, non-tender, non-distended, no mass Skin: No rash, 2 cm necrotic ulcer right fifth metatarsal head laterally, 3 cm callous right first metatarsal head Extremity Pulses:  2+ radial, brachial, femoral, absent right dorsalis pedis, posterior tibial pulses 1+ left dorsalis pedis Musculoskeletal: No deformity or edema  Neurologic: Upper and lower extremity motor 5/5 and symmetric  DATA: The patient had bilateral ABIs and toe pressures today. 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 However I believe he warrants repeat arteriogram to make sure that he has not progressed   PLAN:  Right lower extremity arteriogram with possible intervention September 27. We will rearrange his dialysis schedule so that he has not dialyzed on that same day. Risks benefits possible complications and procedure details were discussed with the patient today.  [name], MD [facility] [place] Office: [phone] Pager: [phone]

## 2012-01-17 ENCOUNTER — Encounter (HOSPITAL_COMMUNITY): Payer: Self-pay | Admitting: Pharmacy Technician

## 2012-01-23 DIAGNOSIS — I739 Peripheral vascular disease, unspecified: Secondary | ICD-10-CM | POA: Diagnosis not present

## 2012-01-23 DIAGNOSIS — N186 End stage renal disease: Secondary | ICD-10-CM | POA: Diagnosis not present

## 2012-01-23 DIAGNOSIS — L97509 Non-pressure chronic ulcer of other part of unspecified foot with unspecified severity: Secondary | ICD-10-CM | POA: Diagnosis not present

## 2012-01-23 DIAGNOSIS — Z992 Dependence on renal dialysis: Secondary | ICD-10-CM | POA: Diagnosis not present

## 2012-01-23 DIAGNOSIS — L84 Corns and callosities: Secondary | ICD-10-CM | POA: Diagnosis not present

## 2012-01-23 DIAGNOSIS — S90129A Contusion of unspecified lesser toe(s) without damage to nail, initial encounter: Secondary | ICD-10-CM | POA: Diagnosis not present

## 2012-01-23 DIAGNOSIS — G5790 Unspecified mononeuropathy of unspecified lower limb: Secondary | ICD-10-CM | POA: Diagnosis not present

## 2012-01-23 MED ORDER — SODIUM CHLORIDE 0.9 % IJ SOLN: 3.0000 mL | INTRAMUSCULAR | Status: DC | PRN

## 2012-01-24 ENCOUNTER — Ambulatory Visit (HOSPITAL_COMMUNITY)
Admission: RE | Admit: 2012-01-24 | Discharge: 2012-01-24 | Disposition: A | Discharge status: Home / Self Care | Payer: Medicare Other | Source: Ambulatory Visit | Attending: Vascular Surgery | Admitting: Vascular Surgery

## 2012-01-24 ENCOUNTER — Encounter (HOSPITAL_COMMUNITY)
Admission: RE | Disposition: A | Discharge status: Home / Self Care | Payer: Self-pay | Source: Ambulatory Visit | Attending: Vascular Surgery

## 2012-01-24 DIAGNOSIS — Z992 Dependence on renal dialysis: Secondary | ICD-10-CM | POA: Insufficient documentation

## 2012-01-24 DIAGNOSIS — L98499 Non-pressure chronic ulcer of skin of other sites with unspecified severity: Secondary | ICD-10-CM | POA: Insufficient documentation

## 2012-01-24 DIAGNOSIS — I120 Hypertensive chronic kidney disease with stage 5 chronic kidney disease or end stage renal disease: Secondary | ICD-10-CM | POA: Diagnosis not present

## 2012-01-24 DIAGNOSIS — N186 End stage renal disease: Secondary | ICD-10-CM | POA: Insufficient documentation

## 2012-01-24 DIAGNOSIS — Z8673 Personal history of transient ischemic attack (TIA), and cerebral infarction without residual deficits: Secondary | ICD-10-CM | POA: Insufficient documentation

## 2012-01-24 DIAGNOSIS — I739 Peripheral vascular disease, unspecified: Secondary | ICD-10-CM | POA: Insufficient documentation

## 2012-01-24 HISTORY — PX: ABDOMINAL AORTAGRAM: SHX5454

## 2012-01-24 LAB — POCT I-STAT, CHEM 8
BUN: 46 mg/dL — ABNORMAL HIGH (ref 6–23)
Creatinine, Ser: 11.7 mg/dL — ABNORMAL HIGH (ref 0.50–1.35)
Potassium: 6.2 mEq/L — ABNORMAL HIGH (ref 3.5–5.1)
Sodium: 133 mEq/L — ABNORMAL LOW (ref 135–145)
TCO2: 22 mmol/L (ref 0–100)

## 2012-01-24 LAB — BASIC METABOLIC PANEL
CO2: 23 mEq/L (ref 19–32)
Glucose, Bld: 79 mg/dL (ref 70–99)
Potassium: 4.3 mEq/L (ref 3.5–5.1)
Sodium: 136 mEq/L (ref 135–145)

## 2012-01-24 SURGERY — ABDOMINAL AORTAGRAM
Anesthesia: LOCAL

## 2012-01-24 MED ORDER — ACETAMINOPHEN 325 MG PO TABS
325.0000 mg | ORAL_TABLET | ORAL | Status: DC | PRN
  Filled 2012-01-24: qty 2

## 2012-01-24 MED ORDER — MORPHINE SULFATE 10 MG/ML IJ SOLN: 2.0000 mg | INTRAMUSCULAR | Status: DC | PRN

## 2012-01-24 MED ORDER — FENTANYL CITRATE 0.05 MG/ML IJ SOLN
INTRAMUSCULAR | Status: AC
  Filled 2012-01-24: qty 2

## 2012-01-24 MED ORDER — PHENOL 1.4 % MT LIQD: 1.0000 | OROMUCOSAL | Status: DC | PRN

## 2012-01-24 MED ORDER — ONDANSETRON HCL 4 MG/2ML IJ SOLN: 4.0000 mg | Freq: Four times a day (QID) | INTRAMUSCULAR | Status: DC | PRN

## 2012-01-24 MED ORDER — HEPARIN (PORCINE) IN NACL 2-0.9 UNIT/ML-% IJ SOLN
INTRAMUSCULAR | Status: AC
  Filled 2012-01-24: qty 500

## 2012-01-24 MED ORDER — HYDRALAZINE HCL 20 MG/ML IJ SOLN: 10.0000 mg | INTRAMUSCULAR | Status: DC | PRN

## 2012-01-24 MED ORDER — LIDOCAINE HCL (PF) 1 % IJ SOLN
INTRAMUSCULAR | Status: AC
  Filled 2012-01-24: qty 30

## 2012-01-24 MED ORDER — INSULIN ASPART 100 UNIT/ML ~~LOC~~ SOLN
0.0000 [IU] | SUBCUTANEOUS | Status: DC
  Filled 2012-01-24: qty 3

## 2012-01-24 MED ORDER — METOPROLOL TARTRATE 1 MG/ML IV SOLN: 2.0000 mg | INTRAVENOUS | Status: DC | PRN

## 2012-01-24 MED ORDER — ACETAMINOPHEN 325 MG RE SUPP
325.0000 mg | RECTAL | Status: DC | PRN
  Filled 2012-01-24: qty 2

## 2012-01-24 MED ORDER — GUAIFENESIN-DM 100-10 MG/5ML PO SYRP: 15.0000 mL | ORAL_SOLUTION | ORAL | Status: DC | PRN

## 2012-01-24 MED ORDER — LABETALOL HCL 5 MG/ML IV SOLN: 10.0000 mg | INTRAVENOUS | Status: DC | PRN

## 2012-01-24 MED ORDER — OXYCODONE HCL 5 MG PO TABS: 5.0000 mg | ORAL_TABLET | ORAL | Status: DC | PRN

## 2012-01-24 NOTE — Op Note (Signed)
Procedure: Aortogram with bilateral lower extremity runoff  Preoperative diagnosis: Non healing ulcer  Postoperative diagnosis: Same  Anesthesia Local  Operative details: After obtaining informed consent, the patient was taken to the [facility]. The patient was placed in supine position on the Angio table. Both groins were prepped and draped in usual sterile fashion. Local anesthesia was infiltrated over the left common femoral artery. An introducer needle was used to cannulate the left common femoral artery and 035 versacore wire threaded into the abdominal aorta under fluoroscopic guidance. Next a 5 French sheath is placed over the guidewire in the left common femoral artery. A 5 French pigtail catheter was placed over the guidewire into the abdominal aorta and abdominal aortogram was obtained. The infrarenal abdominal aorta is patent. The left and right common internal and external iliac arteries are patent. Oblique views of the pelvis were also performed which confirms the above findings.   Next, the pigtail catheter was pulled down to just above the aortic bifurcation and lower extremity runoff views were obtained.  The left and right common femoral, superficial femoral, popliteal arteries are all patent.  The tibial vessels were not well visualized.  In order to get increased opacification of the tibials a 5 Fr crossover catheter was brought up on the field.  The crossover catheter was used to selectively catheterize the right common iliac artery and the guidewire advanced into the right distal external iliac artery. The crossover catheter was removed and replaced with a 5 French straight catheter. Angiogram was then performed the right lower extremity.  In the right leg, the common femoral artery is patent.  The superficial femoral artery is patent. The profunda is patent.  The popliteal artery is patent. The posterior tibial artery is occluded.  The peroneal artery is occluded.  The anterior tibial  artery is patent and fills the DP artery which is the only vessel to the foot.  There is no flow limiting stenosis with inline flow all the way to the foot.    Next the 5 French straight catheter was removed over a guidewire. The 5 French sheath was then connected to obtain a left lower extremity runoff.  In the left leg, the common femoral artery is patent.  The left SFA is patent. The profunda is patent.  The popliteal artery is patent. The posterior tibial artery is occluded.  The peroneal artery is occluded. The anterior tibial artery is the dominant runoff to the foot. This is in continuity to the DP.  The anterior tibial artery is very irregular with multiple segments of 50-75% stenosis.  The 5 Fr sheath was left in place to be pulled in the holding area. The patient tolerated the procedure well and there were no complications. Patient was taken to the holding area in stable condition.  Operative findings: Left leg- patent inflow, SFA, profunda, popliteal, 1 vessel runoff via AT Right leg- patent inflow with patent below knee popliteal one vessel runoff via the AT    Operative management: Continued wound care for right foot flow is optimized if further deterioration may need reevaluation or BKA.  Left anterior tibial artery could undergo angioplasty if he has wounds develop on the left foot.  Otherwise we will see him again in 6 months.  [name], MD [facility] [place] Office: [phone] Pager: [phone]

## 2012-01-24 NOTE — H&P (View-Only) (Signed)
[facility] [place] HISTORY AND PHYSICAL   History of Present Illness:  Patient is a 71 y.o. year old male who presents for evaluation of a nonhealing ulcer right foot.  The patient previously underwent atherectomy and angioplasty of his anterior tibial artery by Dr. Bridgett Larsson in February of 2012.  He now returns referred by Dr. Nils Pyle for a poorly healing wound of his right lateral foot. He denies rest pain. The patient is on hemodialysis and dialyzes Monday Wednesday and Friday at [facility]. Other medical problems include hypertension, peptic ulcer disease, prior stroke. These are all currently stable.  Past Medical History  Diagnosis Date  . Hypertension   . ESRD (end stage renal disease) on dialysis   . CVA (cerebral infarction)   . Gastric ulcer 2004    treated for h.pylori see PSH  . LVH (left ventricular hypertrophy)   . Stroke     Past Surgical History  Procedure Date  . Cholecystectomy   . Arteriovenous graft placement   . Shoulder surgery   . Colonoscopy 2004    Dr. Irving Shows, left sided diverticula and cecal polyp, path unknown  . Esophagogastroduodenoscopy 11/2002    Dr. Gala Romney, erosive reflux esophagitis, multiple gastric ulcer and antral/bulbar erosions. Serologies positive for H.Pylori and was treated  . Esophagogastroduodenoscopy 11/20014    Dr. Gala Romney, small hh only, ulcers healed  . Esophagogastroduodenoscopy 09/21/2011    Dr Trevor Iha [facility], antral erosions, ?early GAVE  . Colonoscopy 10/29/2011    Procedure: COLONOSCOPY;  Surgeon: Daneil Dolin, MD;  Location: [facility];  Service: Endoscopy;  Laterality: N/A;  10:15     Social History History  Substance Use Topics  . Smoking status: Former Smoker    Quit date: 04/29/2004  . Smokeless tobacco: Former Systems developer    Types: [place] date: 01/16/1987   Comment: quit 2006  . Alcohol Use: No    Family History Family History  Problem Relation Age of Onset  . Colon cancer Neg Hx   .  Liver disease Neg Hx     Allergies  Allergies  Allergen Reactions  . Aspirin     Causes internal bleeding      Current Outpatient Prescriptions  Medication Sig Dispense Refill  . ALPRAZolam (XANAX) 0.25 MG tablet Take 0.25 mg by mouth 3 (three) times daily.      Marland Kitchen amLODipine (NORVASC) 5 MG tablet Take 5 mg by mouth daily.      Marland Kitchen b complex-vitamin c-folic acid (NEPHRO-VITE) 0.8 MG TABS Take 0.8 mg by mouth as directed. Takes on Tuesdays, Thursdays, Saturdays, and Sundays. Does not take on Mondays, Wednesdays, and Fridays due to Dialysis treatments.      . cinacalcet (SENSIPAR) 60 MG tablet Take 60 mg by mouth every evening. With evening meal      . hydrOXYzine (ATARAX/VISTARIL) 10 MG tablet Take 10 mg by mouth as needed. For itching      . metoprolol succinate (TOPROL-XL) 50 MG 24 hr tablet Take 50 mg by mouth daily. Take with or immediately following a meal.      . promethazine (PHENERGAN) 25 MG tablet Take 12.5 mg by mouth Daily.      . sevelamer (RENVELA) 800 MG tablet Take 2,400-4,000 mg by mouth 3 (three) times daily with meals. Patient takes 5 tablets with meals and 3 tablets with snacks        ROS:   General:  No weight loss, Fever, chills  HEENT: No  recent headaches, no nasal bleeding, no visual changes, no sore throat  Neurologic: No dizziness, blackouts, seizures. No recent symptoms of stroke or mini- stroke. No recent episodes of slurred speech, or temporary blindness.  Cardiac: No recent episodes of chest pain/pressure, no shortness of breath at rest.  No shortness of breath with exertion.  Denies history of atrial fibrillation or irregular heartbeat  Vascular: No history of rest pain in feet.  No history of claudication.  + history of non-healing ulcer, No history of DVT   Pulmonary: No home oxygen, no productive cough, no hemoptysis,  No asthma or wheezing  Musculoskeletal:  [ ]  Arthritis, [ ]  Low back pain,  [ ]  Joint pain  Hematologic:No history of  hypercoagulable state.  No history of easy bleeding.  No history of anemia  Gastrointestinal: No hematochezia or melena,  No gastroesophageal reflux, no trouble swallowing  Urinary: [ ]  chronic Kidney disease, [ ]  on HD - [ ]  MWF or [ ]  TTHS, [ ]  Burning with urination, [ ]  Frequent urination, [ ]  Difficulty urinating;   Skin: No rashes  Psychological: No history of anxiety,  No history of depression   Physical Examination  Filed Vitals:   01/16/12 1218  BP: 143/86  Pulse: 87  Temp: 98.1 F (36.7 C)  TempSrc: Oral  Height: 5\' 6"  (1.676 m)  Weight: 183 lb (83.008 kg)  SpO2: 98%    Body mass index is 29.54 kg/(m^2).  General:  Alert and oriented, no acute distress HEENT: Normal Neck: No bruit or JVD Pulmonary: Clear to auscultation bilaterally Cardiac: Regular Rate and Rhythm without murmur Abdomen: Soft, non-tender, non-distended, no mass Skin: No rash, 2 cm necrotic ulcer right fifth metatarsal head laterally, 3 cm callous right first metatarsal head Extremity Pulses:  2+ radial, brachial, femoral, absent right dorsalis pedis, posterior tibial pulses 1+ left dorsalis pedis Musculoskeletal: No deformity or edema  Neurologic: Upper and lower extremity motor 5/5 and symmetric  DATA: The patient had bilateral ABIs and toe pressures today. 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 However I believe he warrants repeat arteriogram to make sure that he has not progressed   PLAN:  Right lower extremity arteriogram with possible intervention September 27. We will rearrange his dialysis schedule so that he has not dialyzed on that same day. Risks benefits possible complications and procedure details were discussed with the patient today.  [name], MD [facility] [place] Office: [phone] Pager: [phone]

## 2012-01-24 NOTE — Interval H&P Note (Signed)
History and Physical Interval Note:  01/24/2012 9:58 AM  [name]  has presented today for surgery, with the diagnosis of pvd  The various methods of treatment have been discussed with the patient and family. After consideration of risks, benefits and other options for treatment, the patient has consented to  Procedure(s) (LRB) with comments: ABDOMINAL AORTAGRAM (N/A) as a surgical intervention .  The patient's history has been reviewed, patient examined, no change in status, stable for surgery.  I have reviewed the patient's chart and labs.  Questions were answered to the patient's satisfaction.     FIELDS,CHARLES E

## 2012-01-27 ENCOUNTER — Other Ambulatory Visit: Payer: Self-pay | Admitting: *Deleted

## 2012-01-27 DIAGNOSIS — N186 End stage renal disease: Secondary | ICD-10-CM | POA: Diagnosis not present

## 2012-01-27 DIAGNOSIS — I739 Peripheral vascular disease, unspecified: Secondary | ICD-10-CM

## 2012-01-29 DIAGNOSIS — N2581 Secondary hyperparathyroidism of renal origin: Secondary | ICD-10-CM | POA: Diagnosis not present

## 2012-01-29 DIAGNOSIS — N186 End stage renal disease: Secondary | ICD-10-CM | POA: Diagnosis not present

## 2012-01-29 DIAGNOSIS — L89899 Pressure ulcer of other site, unspecified stage: Secondary | ICD-10-CM | POA: Diagnosis not present

## 2012-01-29 DIAGNOSIS — Z992 Dependence on renal dialysis: Secondary | ICD-10-CM | POA: Diagnosis not present

## 2012-01-29 DIAGNOSIS — D509 Iron deficiency anemia, unspecified: Secondary | ICD-10-CM | POA: Diagnosis not present

## 2012-01-31 DIAGNOSIS — L89899 Pressure ulcer of other site, unspecified stage: Secondary | ICD-10-CM | POA: Diagnosis not present

## 2012-01-31 DIAGNOSIS — N186 End stage renal disease: Secondary | ICD-10-CM | POA: Diagnosis not present

## 2012-01-31 DIAGNOSIS — N2581 Secondary hyperparathyroidism of renal origin: Secondary | ICD-10-CM | POA: Diagnosis not present

## 2012-01-31 DIAGNOSIS — D509 Iron deficiency anemia, unspecified: Secondary | ICD-10-CM | POA: Diagnosis not present

## 2012-01-31 DIAGNOSIS — Z992 Dependence on renal dialysis: Secondary | ICD-10-CM | POA: Diagnosis not present

## 2012-02-03 DIAGNOSIS — N186 End stage renal disease: Secondary | ICD-10-CM | POA: Diagnosis not present

## 2012-02-03 DIAGNOSIS — L89899 Pressure ulcer of other site, unspecified stage: Secondary | ICD-10-CM | POA: Diagnosis not present

## 2012-02-03 DIAGNOSIS — Z992 Dependence on renal dialysis: Secondary | ICD-10-CM | POA: Diagnosis not present

## 2012-02-03 DIAGNOSIS — N2581 Secondary hyperparathyroidism of renal origin: Secondary | ICD-10-CM | POA: Diagnosis not present

## 2012-02-03 DIAGNOSIS — D509 Iron deficiency anemia, unspecified: Secondary | ICD-10-CM | POA: Diagnosis not present

## 2012-02-05 DIAGNOSIS — D509 Iron deficiency anemia, unspecified: Secondary | ICD-10-CM | POA: Diagnosis not present

## 2012-02-05 DIAGNOSIS — N186 End stage renal disease: Secondary | ICD-10-CM | POA: Diagnosis not present

## 2012-02-05 DIAGNOSIS — Z992 Dependence on renal dialysis: Secondary | ICD-10-CM | POA: Diagnosis not present

## 2012-02-05 DIAGNOSIS — L89899 Pressure ulcer of other site, unspecified stage: Secondary | ICD-10-CM | POA: Diagnosis not present

## 2012-02-05 DIAGNOSIS — N2581 Secondary hyperparathyroidism of renal origin: Secondary | ICD-10-CM | POA: Diagnosis not present

## 2012-02-05 DIAGNOSIS — I10 Essential (primary) hypertension: Secondary | ICD-10-CM | POA: Diagnosis not present

## 2012-02-06 DIAGNOSIS — L03119 Cellulitis of unspecified part of limb: Secondary | ICD-10-CM | POA: Diagnosis not present

## 2012-02-06 DIAGNOSIS — L84 Corns and callosities: Secondary | ICD-10-CM | POA: Diagnosis not present

## 2012-02-06 DIAGNOSIS — G5790 Unspecified mononeuropathy of unspecified lower limb: Secondary | ICD-10-CM | POA: Diagnosis not present

## 2012-02-06 DIAGNOSIS — Z992 Dependence on renal dialysis: Secondary | ICD-10-CM | POA: Diagnosis not present

## 2012-02-06 DIAGNOSIS — I739 Peripheral vascular disease, unspecified: Secondary | ICD-10-CM | POA: Diagnosis not present

## 2012-02-06 DIAGNOSIS — R233 Spontaneous ecchymoses: Secondary | ICD-10-CM | POA: Diagnosis not present

## 2012-02-06 DIAGNOSIS — L98499 Non-pressure chronic ulcer of skin of other sites with unspecified severity: Secondary | ICD-10-CM | POA: Diagnosis not present

## 2012-02-06 DIAGNOSIS — L97509 Non-pressure chronic ulcer of other part of unspecified foot with unspecified severity: Secondary | ICD-10-CM | POA: Diagnosis not present

## 2012-02-06 DIAGNOSIS — N186 End stage renal disease: Secondary | ICD-10-CM | POA: Diagnosis not present

## 2012-02-06 DIAGNOSIS — I999 Unspecified disorder of circulatory system: Secondary | ICD-10-CM | POA: Diagnosis not present

## 2012-02-06 DIAGNOSIS — R209 Unspecified disturbances of skin sensation: Secondary | ICD-10-CM | POA: Diagnosis not present

## 2012-02-06 DIAGNOSIS — L02619 Cutaneous abscess of unspecified foot: Secondary | ICD-10-CM | POA: Diagnosis not present

## 2012-02-07 DIAGNOSIS — N186 End stage renal disease: Secondary | ICD-10-CM | POA: Diagnosis not present

## 2012-02-07 DIAGNOSIS — N2581 Secondary hyperparathyroidism of renal origin: Secondary | ICD-10-CM | POA: Diagnosis not present

## 2012-02-07 DIAGNOSIS — Z992 Dependence on renal dialysis: Secondary | ICD-10-CM | POA: Diagnosis not present

## 2012-02-07 DIAGNOSIS — L89899 Pressure ulcer of other site, unspecified stage: Secondary | ICD-10-CM | POA: Diagnosis not present

## 2012-02-07 DIAGNOSIS — D509 Iron deficiency anemia, unspecified: Secondary | ICD-10-CM | POA: Diagnosis not present

## 2012-02-10 DIAGNOSIS — N2581 Secondary hyperparathyroidism of renal origin: Secondary | ICD-10-CM | POA: Diagnosis not present

## 2012-02-10 DIAGNOSIS — Z992 Dependence on renal dialysis: Secondary | ICD-10-CM | POA: Diagnosis not present

## 2012-02-10 DIAGNOSIS — N186 End stage renal disease: Secondary | ICD-10-CM | POA: Diagnosis not present

## 2012-02-10 DIAGNOSIS — D509 Iron deficiency anemia, unspecified: Secondary | ICD-10-CM | POA: Diagnosis not present

## 2012-02-10 DIAGNOSIS — L89899 Pressure ulcer of other site, unspecified stage: Secondary | ICD-10-CM | POA: Diagnosis not present

## 2012-02-12 DIAGNOSIS — L89899 Pressure ulcer of other site, unspecified stage: Secondary | ICD-10-CM | POA: Diagnosis not present

## 2012-02-12 DIAGNOSIS — N186 End stage renal disease: Secondary | ICD-10-CM | POA: Diagnosis not present

## 2012-02-12 DIAGNOSIS — Z992 Dependence on renal dialysis: Secondary | ICD-10-CM | POA: Diagnosis not present

## 2012-02-12 DIAGNOSIS — N2581 Secondary hyperparathyroidism of renal origin: Secondary | ICD-10-CM | POA: Diagnosis not present

## 2012-02-12 DIAGNOSIS — D509 Iron deficiency anemia, unspecified: Secondary | ICD-10-CM | POA: Diagnosis not present

## 2012-02-13 DIAGNOSIS — L98499 Non-pressure chronic ulcer of skin of other sites with unspecified severity: Secondary | ICD-10-CM | POA: Diagnosis not present

## 2012-02-13 DIAGNOSIS — L02619 Cutaneous abscess of unspecified foot: Secondary | ICD-10-CM | POA: Diagnosis not present

## 2012-02-13 DIAGNOSIS — L97509 Non-pressure chronic ulcer of other part of unspecified foot with unspecified severity: Secondary | ICD-10-CM | POA: Diagnosis not present

## 2012-02-13 DIAGNOSIS — I999 Unspecified disorder of circulatory system: Secondary | ICD-10-CM | POA: Diagnosis not present

## 2012-02-13 DIAGNOSIS — R209 Unspecified disturbances of skin sensation: Secondary | ICD-10-CM | POA: Diagnosis not present

## 2012-02-13 DIAGNOSIS — N186 End stage renal disease: Secondary | ICD-10-CM | POA: Diagnosis not present

## 2012-02-13 DIAGNOSIS — G5790 Unspecified mononeuropathy of unspecified lower limb: Secondary | ICD-10-CM | POA: Diagnosis not present

## 2012-02-13 DIAGNOSIS — I739 Peripheral vascular disease, unspecified: Secondary | ICD-10-CM | POA: Diagnosis not present

## 2012-02-14 DIAGNOSIS — L89899 Pressure ulcer of other site, unspecified stage: Secondary | ICD-10-CM | POA: Diagnosis not present

## 2012-02-14 DIAGNOSIS — N186 End stage renal disease: Secondary | ICD-10-CM | POA: Diagnosis not present

## 2012-02-14 DIAGNOSIS — D509 Iron deficiency anemia, unspecified: Secondary | ICD-10-CM | POA: Diagnosis not present

## 2012-02-14 DIAGNOSIS — Z992 Dependence on renal dialysis: Secondary | ICD-10-CM | POA: Diagnosis not present

## 2012-02-14 DIAGNOSIS — N2581 Secondary hyperparathyroidism of renal origin: Secondary | ICD-10-CM | POA: Diagnosis not present

## 2012-02-17 DIAGNOSIS — N2581 Secondary hyperparathyroidism of renal origin: Secondary | ICD-10-CM | POA: Diagnosis not present

## 2012-02-17 DIAGNOSIS — D509 Iron deficiency anemia, unspecified: Secondary | ICD-10-CM | POA: Diagnosis not present

## 2012-02-17 DIAGNOSIS — Z992 Dependence on renal dialysis: Secondary | ICD-10-CM | POA: Diagnosis not present

## 2012-02-17 DIAGNOSIS — N186 End stage renal disease: Secondary | ICD-10-CM | POA: Diagnosis not present

## 2012-02-17 DIAGNOSIS — L89899 Pressure ulcer of other site, unspecified stage: Secondary | ICD-10-CM | POA: Diagnosis not present

## 2012-02-18 DIAGNOSIS — N19 Unspecified kidney failure: Secondary | ICD-10-CM | POA: Diagnosis not present

## 2012-02-18 DIAGNOSIS — I10 Essential (primary) hypertension: Secondary | ICD-10-CM | POA: Diagnosis not present

## 2012-02-19 DIAGNOSIS — Z992 Dependence on renal dialysis: Secondary | ICD-10-CM | POA: Diagnosis not present

## 2012-02-19 DIAGNOSIS — N186 End stage renal disease: Secondary | ICD-10-CM | POA: Diagnosis not present

## 2012-02-19 DIAGNOSIS — D509 Iron deficiency anemia, unspecified: Secondary | ICD-10-CM | POA: Diagnosis not present

## 2012-02-19 DIAGNOSIS — N2581 Secondary hyperparathyroidism of renal origin: Secondary | ICD-10-CM | POA: Diagnosis not present

## 2012-02-19 DIAGNOSIS — L89899 Pressure ulcer of other site, unspecified stage: Secondary | ICD-10-CM | POA: Diagnosis not present

## 2012-02-21 DIAGNOSIS — Z992 Dependence on renal dialysis: Secondary | ICD-10-CM | POA: Diagnosis not present

## 2012-02-21 DIAGNOSIS — N186 End stage renal disease: Secondary | ICD-10-CM | POA: Diagnosis not present

## 2012-02-21 DIAGNOSIS — D509 Iron deficiency anemia, unspecified: Secondary | ICD-10-CM | POA: Diagnosis not present

## 2012-02-21 DIAGNOSIS — L89899 Pressure ulcer of other site, unspecified stage: Secondary | ICD-10-CM | POA: Diagnosis not present

## 2012-02-21 DIAGNOSIS — N2581 Secondary hyperparathyroidism of renal origin: Secondary | ICD-10-CM | POA: Diagnosis not present

## 2012-02-24 DIAGNOSIS — D509 Iron deficiency anemia, unspecified: Secondary | ICD-10-CM | POA: Diagnosis not present

## 2012-02-24 DIAGNOSIS — L89899 Pressure ulcer of other site, unspecified stage: Secondary | ICD-10-CM | POA: Diagnosis not present

## 2012-02-24 DIAGNOSIS — N2581 Secondary hyperparathyroidism of renal origin: Secondary | ICD-10-CM | POA: Diagnosis not present

## 2012-02-24 DIAGNOSIS — N186 End stage renal disease: Secondary | ICD-10-CM | POA: Diagnosis not present

## 2012-02-24 DIAGNOSIS — Z992 Dependence on renal dialysis: Secondary | ICD-10-CM | POA: Diagnosis not present

## 2012-02-25 DIAGNOSIS — I120 Hypertensive chronic kidney disease with stage 5 chronic kidney disease or end stage renal disease: Secondary | ICD-10-CM | POA: Diagnosis not present

## 2012-02-25 DIAGNOSIS — Z992 Dependence on renal dialysis: Secondary | ICD-10-CM | POA: Diagnosis not present

## 2012-02-25 DIAGNOSIS — L97409 Non-pressure chronic ulcer of unspecified heel and midfoot with unspecified severity: Secondary | ICD-10-CM | POA: Diagnosis not present

## 2012-02-25 DIAGNOSIS — N186 End stage renal disease: Secondary | ICD-10-CM | POA: Diagnosis not present

## 2012-02-26 DIAGNOSIS — N186 End stage renal disease: Secondary | ICD-10-CM | POA: Diagnosis not present

## 2012-02-26 DIAGNOSIS — N2581 Secondary hyperparathyroidism of renal origin: Secondary | ICD-10-CM | POA: Diagnosis not present

## 2012-02-26 DIAGNOSIS — D509 Iron deficiency anemia, unspecified: Secondary | ICD-10-CM | POA: Diagnosis not present

## 2012-02-26 DIAGNOSIS — L89899 Pressure ulcer of other site, unspecified stage: Secondary | ICD-10-CM | POA: Diagnosis not present

## 2012-02-26 DIAGNOSIS — Z992 Dependence on renal dialysis: Secondary | ICD-10-CM | POA: Diagnosis not present

## 2012-02-27 DIAGNOSIS — I999 Unspecified disorder of circulatory system: Secondary | ICD-10-CM | POA: Diagnosis not present

## 2012-02-27 DIAGNOSIS — L97509 Non-pressure chronic ulcer of other part of unspecified foot with unspecified severity: Secondary | ICD-10-CM | POA: Diagnosis not present

## 2012-02-27 DIAGNOSIS — G5790 Unspecified mononeuropathy of unspecified lower limb: Secondary | ICD-10-CM | POA: Diagnosis not present

## 2012-02-27 DIAGNOSIS — R209 Unspecified disturbances of skin sensation: Secondary | ICD-10-CM | POA: Diagnosis not present

## 2012-02-27 DIAGNOSIS — L03119 Cellulitis of unspecified part of limb: Secondary | ICD-10-CM | POA: Diagnosis not present

## 2012-02-27 DIAGNOSIS — N186 End stage renal disease: Secondary | ICD-10-CM | POA: Diagnosis not present

## 2012-02-27 DIAGNOSIS — L02619 Cutaneous abscess of unspecified foot: Secondary | ICD-10-CM | POA: Diagnosis not present

## 2012-02-28 DIAGNOSIS — L89899 Pressure ulcer of other site, unspecified stage: Secondary | ICD-10-CM | POA: Diagnosis not present

## 2012-02-28 DIAGNOSIS — S91309A Unspecified open wound, unspecified foot, initial encounter: Secondary | ICD-10-CM | POA: Diagnosis not present

## 2012-02-28 DIAGNOSIS — D509 Iron deficiency anemia, unspecified: Secondary | ICD-10-CM | POA: Diagnosis not present

## 2012-02-28 DIAGNOSIS — M869 Osteomyelitis, unspecified: Secondary | ICD-10-CM | POA: Diagnosis not present

## 2012-02-28 DIAGNOSIS — N186 End stage renal disease: Secondary | ICD-10-CM | POA: Diagnosis not present

## 2012-02-28 DIAGNOSIS — N2581 Secondary hyperparathyroidism of renal origin: Secondary | ICD-10-CM | POA: Diagnosis not present

## 2012-03-02 DIAGNOSIS — I749 Embolism and thrombosis of unspecified artery: Secondary | ICD-10-CM | POA: Diagnosis not present

## 2012-03-02 DIAGNOSIS — G5790 Unspecified mononeuropathy of unspecified lower limb: Secondary | ICD-10-CM | POA: Diagnosis not present

## 2012-03-02 DIAGNOSIS — S91109A Unspecified open wound of unspecified toe(s) without damage to nail, initial encounter: Secondary | ICD-10-CM | POA: Diagnosis not present

## 2012-03-02 DIAGNOSIS — L84 Corns and callosities: Secondary | ICD-10-CM | POA: Diagnosis not present

## 2012-03-02 DIAGNOSIS — S98139A Complete traumatic amputation of one unspecified lesser toe, initial encounter: Secondary | ICD-10-CM | POA: Diagnosis not present

## 2012-03-02 DIAGNOSIS — N2581 Secondary hyperparathyroidism of renal origin: Secondary | ICD-10-CM | POA: Diagnosis not present

## 2012-03-02 DIAGNOSIS — N186 End stage renal disease: Secondary | ICD-10-CM | POA: Diagnosis not present

## 2012-03-02 DIAGNOSIS — M869 Osteomyelitis, unspecified: Secondary | ICD-10-CM | POA: Diagnosis not present

## 2012-03-02 DIAGNOSIS — R233 Spontaneous ecchymoses: Secondary | ICD-10-CM | POA: Diagnosis not present

## 2012-03-02 DIAGNOSIS — L97409 Non-pressure chronic ulcer of unspecified heel and midfoot with unspecified severity: Secondary | ICD-10-CM | POA: Diagnosis not present

## 2012-03-02 DIAGNOSIS — I999 Unspecified disorder of circulatory system: Secondary | ICD-10-CM | POA: Diagnosis not present

## 2012-03-02 DIAGNOSIS — L97509 Non-pressure chronic ulcer of other part of unspecified foot with unspecified severity: Secondary | ICD-10-CM | POA: Diagnosis not present

## 2012-03-02 DIAGNOSIS — L89899 Pressure ulcer of other site, unspecified stage: Secondary | ICD-10-CM | POA: Diagnosis not present

## 2012-03-02 DIAGNOSIS — R209 Unspecified disturbances of skin sensation: Secondary | ICD-10-CM | POA: Diagnosis not present

## 2012-03-02 DIAGNOSIS — D509 Iron deficiency anemia, unspecified: Secondary | ICD-10-CM | POA: Diagnosis not present

## 2012-03-02 DIAGNOSIS — Z992 Dependence on renal dialysis: Secondary | ICD-10-CM | POA: Diagnosis not present

## 2012-03-04 DIAGNOSIS — N2581 Secondary hyperparathyroidism of renal origin: Secondary | ICD-10-CM | POA: Diagnosis not present

## 2012-03-04 DIAGNOSIS — L89899 Pressure ulcer of other site, unspecified stage: Secondary | ICD-10-CM | POA: Diagnosis not present

## 2012-03-04 DIAGNOSIS — M869 Osteomyelitis, unspecified: Secondary | ICD-10-CM | POA: Diagnosis not present

## 2012-03-04 DIAGNOSIS — D509 Iron deficiency anemia, unspecified: Secondary | ICD-10-CM | POA: Diagnosis not present

## 2012-03-04 DIAGNOSIS — N186 End stage renal disease: Secondary | ICD-10-CM | POA: Diagnosis not present

## 2012-03-05 ENCOUNTER — Other Ambulatory Visit (HOSPITAL_COMMUNITY): Payer: Self-pay | Admitting: *Deleted

## 2012-03-05 ENCOUNTER — Ambulatory Visit (HOSPITAL_COMMUNITY)
Admission: RE | Admit: 2012-03-05 | Discharge: 2012-03-05 | Disposition: A | Discharge status: Home / Self Care | Payer: Medicare Other | Source: Ambulatory Visit | Attending: Internal Medicine | Admitting: Internal Medicine

## 2012-03-05 DIAGNOSIS — R209 Unspecified disturbances of skin sensation: Secondary | ICD-10-CM | POA: Diagnosis not present

## 2012-03-05 DIAGNOSIS — M7989 Other specified soft tissue disorders: Secondary | ICD-10-CM | POA: Diagnosis not present

## 2012-03-05 DIAGNOSIS — I739 Peripheral vascular disease, unspecified: Secondary | ICD-10-CM | POA: Diagnosis not present

## 2012-03-05 DIAGNOSIS — R52 Pain, unspecified: Secondary | ICD-10-CM | POA: Insufficient documentation

## 2012-03-05 DIAGNOSIS — L97509 Non-pressure chronic ulcer of other part of unspecified foot with unspecified severity: Secondary | ICD-10-CM | POA: Diagnosis not present

## 2012-03-05 DIAGNOSIS — N186 End stage renal disease: Secondary | ICD-10-CM | POA: Diagnosis not present

## 2012-03-05 DIAGNOSIS — L84 Corns and callosities: Secondary | ICD-10-CM | POA: Diagnosis not present

## 2012-03-05 DIAGNOSIS — L98499 Non-pressure chronic ulcer of skin of other sites with unspecified severity: Secondary | ICD-10-CM | POA: Diagnosis not present

## 2012-03-05 DIAGNOSIS — L97409 Non-pressure chronic ulcer of unspecified heel and midfoot with unspecified severity: Secondary | ICD-10-CM | POA: Diagnosis not present

## 2012-03-05 DIAGNOSIS — I749 Embolism and thrombosis of unspecified artery: Secondary | ICD-10-CM | POA: Diagnosis not present

## 2012-03-05 DIAGNOSIS — M79609 Pain in unspecified limb: Secondary | ICD-10-CM | POA: Diagnosis not present

## 2012-03-06 DIAGNOSIS — L89899 Pressure ulcer of other site, unspecified stage: Secondary | ICD-10-CM | POA: Diagnosis not present

## 2012-03-06 DIAGNOSIS — M869 Osteomyelitis, unspecified: Secondary | ICD-10-CM | POA: Diagnosis not present

## 2012-03-06 DIAGNOSIS — N2581 Secondary hyperparathyroidism of renal origin: Secondary | ICD-10-CM | POA: Diagnosis not present

## 2012-03-06 DIAGNOSIS — N186 End stage renal disease: Secondary | ICD-10-CM | POA: Diagnosis not present

## 2012-03-06 DIAGNOSIS — D509 Iron deficiency anemia, unspecified: Secondary | ICD-10-CM | POA: Diagnosis not present

## 2012-03-09 DIAGNOSIS — M869 Osteomyelitis, unspecified: Secondary | ICD-10-CM | POA: Diagnosis not present

## 2012-03-09 DIAGNOSIS — D509 Iron deficiency anemia, unspecified: Secondary | ICD-10-CM | POA: Diagnosis not present

## 2012-03-09 DIAGNOSIS — L89899 Pressure ulcer of other site, unspecified stage: Secondary | ICD-10-CM | POA: Diagnosis not present

## 2012-03-09 DIAGNOSIS — N2581 Secondary hyperparathyroidism of renal origin: Secondary | ICD-10-CM | POA: Diagnosis not present

## 2012-03-09 DIAGNOSIS — N186 End stage renal disease: Secondary | ICD-10-CM | POA: Diagnosis not present

## 2012-03-11 DIAGNOSIS — N2581 Secondary hyperparathyroidism of renal origin: Secondary | ICD-10-CM | POA: Diagnosis not present

## 2012-03-11 DIAGNOSIS — L89899 Pressure ulcer of other site, unspecified stage: Secondary | ICD-10-CM | POA: Diagnosis not present

## 2012-03-11 DIAGNOSIS — M869 Osteomyelitis, unspecified: Secondary | ICD-10-CM | POA: Diagnosis not present

## 2012-03-11 DIAGNOSIS — D509 Iron deficiency anemia, unspecified: Secondary | ICD-10-CM | POA: Diagnosis not present

## 2012-03-11 DIAGNOSIS — N186 End stage renal disease: Secondary | ICD-10-CM | POA: Diagnosis not present

## 2012-03-13 DIAGNOSIS — N186 End stage renal disease: Secondary | ICD-10-CM | POA: Diagnosis not present

## 2012-03-13 DIAGNOSIS — N2581 Secondary hyperparathyroidism of renal origin: Secondary | ICD-10-CM | POA: Diagnosis not present

## 2012-03-13 DIAGNOSIS — D509 Iron deficiency anemia, unspecified: Secondary | ICD-10-CM | POA: Diagnosis not present

## 2012-03-13 DIAGNOSIS — L89899 Pressure ulcer of other site, unspecified stage: Secondary | ICD-10-CM | POA: Diagnosis not present

## 2012-03-13 DIAGNOSIS — M869 Osteomyelitis, unspecified: Secondary | ICD-10-CM | POA: Diagnosis not present

## 2012-03-16 DIAGNOSIS — N186 End stage renal disease: Secondary | ICD-10-CM | POA: Diagnosis not present

## 2012-03-16 DIAGNOSIS — D509 Iron deficiency anemia, unspecified: Secondary | ICD-10-CM | POA: Diagnosis not present

## 2012-03-16 DIAGNOSIS — R209 Unspecified disturbances of skin sensation: Secondary | ICD-10-CM | POA: Diagnosis not present

## 2012-03-16 DIAGNOSIS — M869 Osteomyelitis, unspecified: Secondary | ICD-10-CM | POA: Diagnosis not present

## 2012-03-16 DIAGNOSIS — L84 Corns and callosities: Secondary | ICD-10-CM | POA: Diagnosis not present

## 2012-03-16 DIAGNOSIS — L97509 Non-pressure chronic ulcer of other part of unspecified foot with unspecified severity: Secondary | ICD-10-CM | POA: Diagnosis not present

## 2012-03-16 DIAGNOSIS — I749 Embolism and thrombosis of unspecified artery: Secondary | ICD-10-CM | POA: Diagnosis not present

## 2012-03-16 DIAGNOSIS — L89899 Pressure ulcer of other site, unspecified stage: Secondary | ICD-10-CM | POA: Diagnosis not present

## 2012-03-16 DIAGNOSIS — L97409 Non-pressure chronic ulcer of unspecified heel and midfoot with unspecified severity: Secondary | ICD-10-CM | POA: Diagnosis not present

## 2012-03-16 DIAGNOSIS — N2581 Secondary hyperparathyroidism of renal origin: Secondary | ICD-10-CM | POA: Diagnosis not present

## 2012-03-18 DIAGNOSIS — N186 End stage renal disease: Secondary | ICD-10-CM | POA: Diagnosis not present

## 2012-03-18 DIAGNOSIS — M869 Osteomyelitis, unspecified: Secondary | ICD-10-CM | POA: Diagnosis not present

## 2012-03-18 DIAGNOSIS — L89899 Pressure ulcer of other site, unspecified stage: Secondary | ICD-10-CM | POA: Diagnosis not present

## 2012-03-18 DIAGNOSIS — D509 Iron deficiency anemia, unspecified: Secondary | ICD-10-CM | POA: Diagnosis not present

## 2012-03-18 DIAGNOSIS — N2581 Secondary hyperparathyroidism of renal origin: Secondary | ICD-10-CM | POA: Diagnosis not present

## 2012-03-19 DIAGNOSIS — L97409 Non-pressure chronic ulcer of unspecified heel and midfoot with unspecified severity: Secondary | ICD-10-CM | POA: Diagnosis not present

## 2012-03-19 DIAGNOSIS — IMO0002 Reserved for concepts with insufficient information to code with codable children: Secondary | ICD-10-CM | POA: Diagnosis not present

## 2012-03-19 DIAGNOSIS — L98499 Non-pressure chronic ulcer of skin of other sites with unspecified severity: Secondary | ICD-10-CM | POA: Diagnosis not present

## 2012-03-19 DIAGNOSIS — I749 Embolism and thrombosis of unspecified artery: Secondary | ICD-10-CM | POA: Diagnosis not present

## 2012-03-19 DIAGNOSIS — S90129A Contusion of unspecified lesser toe(s) without damage to nail, initial encounter: Secondary | ICD-10-CM | POA: Diagnosis not present

## 2012-03-19 DIAGNOSIS — R209 Unspecified disturbances of skin sensation: Secondary | ICD-10-CM | POA: Diagnosis not present

## 2012-03-19 DIAGNOSIS — N186 End stage renal disease: Secondary | ICD-10-CM | POA: Diagnosis not present

## 2012-03-19 DIAGNOSIS — L97509 Non-pressure chronic ulcer of other part of unspecified foot with unspecified severity: Secondary | ICD-10-CM | POA: Diagnosis not present

## 2012-03-19 DIAGNOSIS — L84 Corns and callosities: Secondary | ICD-10-CM | POA: Diagnosis not present

## 2012-03-20 DIAGNOSIS — N2581 Secondary hyperparathyroidism of renal origin: Secondary | ICD-10-CM | POA: Diagnosis not present

## 2012-03-20 DIAGNOSIS — N186 End stage renal disease: Secondary | ICD-10-CM | POA: Diagnosis not present

## 2012-03-20 DIAGNOSIS — D509 Iron deficiency anemia, unspecified: Secondary | ICD-10-CM | POA: Diagnosis not present

## 2012-03-20 DIAGNOSIS — L89899 Pressure ulcer of other site, unspecified stage: Secondary | ICD-10-CM | POA: Diagnosis not present

## 2012-03-20 DIAGNOSIS — M869 Osteomyelitis, unspecified: Secondary | ICD-10-CM | POA: Diagnosis not present

## 2012-03-23 DIAGNOSIS — M869 Osteomyelitis, unspecified: Secondary | ICD-10-CM | POA: Diagnosis not present

## 2012-03-23 DIAGNOSIS — N186 End stage renal disease: Secondary | ICD-10-CM | POA: Diagnosis not present

## 2012-03-23 DIAGNOSIS — L89899 Pressure ulcer of other site, unspecified stage: Secondary | ICD-10-CM | POA: Diagnosis not present

## 2012-03-23 DIAGNOSIS — N2581 Secondary hyperparathyroidism of renal origin: Secondary | ICD-10-CM | POA: Diagnosis not present

## 2012-03-23 DIAGNOSIS — D509 Iron deficiency anemia, unspecified: Secondary | ICD-10-CM | POA: Diagnosis not present

## 2012-03-25 DIAGNOSIS — M869 Osteomyelitis, unspecified: Secondary | ICD-10-CM | POA: Diagnosis not present

## 2012-03-25 DIAGNOSIS — L89899 Pressure ulcer of other site, unspecified stage: Secondary | ICD-10-CM | POA: Diagnosis not present

## 2012-03-25 DIAGNOSIS — N186 End stage renal disease: Secondary | ICD-10-CM | POA: Diagnosis not present

## 2012-03-25 DIAGNOSIS — N2581 Secondary hyperparathyroidism of renal origin: Secondary | ICD-10-CM | POA: Diagnosis not present

## 2012-03-25 DIAGNOSIS — D509 Iron deficiency anemia, unspecified: Secondary | ICD-10-CM | POA: Diagnosis not present

## 2012-03-27 DIAGNOSIS — N186 End stage renal disease: Secondary | ICD-10-CM | POA: Diagnosis not present

## 2012-03-27 DIAGNOSIS — D509 Iron deficiency anemia, unspecified: Secondary | ICD-10-CM | POA: Diagnosis not present

## 2012-03-27 DIAGNOSIS — M869 Osteomyelitis, unspecified: Secondary | ICD-10-CM | POA: Diagnosis not present

## 2012-03-27 DIAGNOSIS — N2581 Secondary hyperparathyroidism of renal origin: Secondary | ICD-10-CM | POA: Diagnosis not present

## 2012-03-27 DIAGNOSIS — L89899 Pressure ulcer of other site, unspecified stage: Secondary | ICD-10-CM | POA: Diagnosis not present

## 2012-03-28 DIAGNOSIS — N186 End stage renal disease: Secondary | ICD-10-CM | POA: Diagnosis not present

## 2012-03-30 DIAGNOSIS — N186 End stage renal disease: Secondary | ICD-10-CM | POA: Diagnosis not present

## 2012-03-30 DIAGNOSIS — M869 Osteomyelitis, unspecified: Secondary | ICD-10-CM | POA: Diagnosis not present

## 2012-03-30 DIAGNOSIS — N2581 Secondary hyperparathyroidism of renal origin: Secondary | ICD-10-CM | POA: Diagnosis not present

## 2012-03-30 DIAGNOSIS — D509 Iron deficiency anemia, unspecified: Secondary | ICD-10-CM | POA: Diagnosis not present

## 2012-03-31 DIAGNOSIS — L98499 Non-pressure chronic ulcer of skin of other sites with unspecified severity: Secondary | ICD-10-CM | POA: Diagnosis not present

## 2012-03-31 DIAGNOSIS — I10 Essential (primary) hypertension: Secondary | ICD-10-CM | POA: Diagnosis not present

## 2012-03-31 DIAGNOSIS — L97509 Non-pressure chronic ulcer of other part of unspecified foot with unspecified severity: Secondary | ICD-10-CM | POA: Diagnosis not present

## 2012-03-31 DIAGNOSIS — L84 Corns and callosities: Secondary | ICD-10-CM | POA: Diagnosis not present

## 2012-03-31 DIAGNOSIS — N19 Unspecified kidney failure: Secondary | ICD-10-CM | POA: Diagnosis not present

## 2012-03-31 DIAGNOSIS — Z992 Dependence on renal dialysis: Secondary | ICD-10-CM | POA: Diagnosis not present

## 2012-03-31 DIAGNOSIS — I749 Embolism and thrombosis of unspecified artery: Secondary | ICD-10-CM | POA: Diagnosis not present

## 2012-03-31 DIAGNOSIS — I739 Peripheral vascular disease, unspecified: Secondary | ICD-10-CM | POA: Diagnosis not present

## 2012-03-31 DIAGNOSIS — G5790 Unspecified mononeuropathy of unspecified lower limb: Secondary | ICD-10-CM | POA: Diagnosis not present

## 2012-03-31 DIAGNOSIS — N186 End stage renal disease: Secondary | ICD-10-CM | POA: Diagnosis not present

## 2012-04-01 DIAGNOSIS — N186 End stage renal disease: Secondary | ICD-10-CM | POA: Diagnosis not present

## 2012-04-01 DIAGNOSIS — M869 Osteomyelitis, unspecified: Secondary | ICD-10-CM | POA: Diagnosis not present

## 2012-04-01 DIAGNOSIS — N2581 Secondary hyperparathyroidism of renal origin: Secondary | ICD-10-CM | POA: Diagnosis not present

## 2012-04-01 DIAGNOSIS — D509 Iron deficiency anemia, unspecified: Secondary | ICD-10-CM | POA: Diagnosis not present

## 2012-04-02 DIAGNOSIS — T82898A Other specified complication of vascular prosthetic devices, implants and grafts, initial encounter: Secondary | ICD-10-CM | POA: Diagnosis not present

## 2012-04-02 DIAGNOSIS — I771 Stricture of artery: Secondary | ICD-10-CM | POA: Diagnosis not present

## 2012-04-02 DIAGNOSIS — I871 Compression of vein: Secondary | ICD-10-CM | POA: Diagnosis not present

## 2012-04-02 DIAGNOSIS — N186 End stage renal disease: Secondary | ICD-10-CM | POA: Diagnosis not present

## 2012-04-03 DIAGNOSIS — M869 Osteomyelitis, unspecified: Secondary | ICD-10-CM | POA: Diagnosis not present

## 2012-04-03 DIAGNOSIS — N2581 Secondary hyperparathyroidism of renal origin: Secondary | ICD-10-CM | POA: Diagnosis not present

## 2012-04-03 DIAGNOSIS — N186 End stage renal disease: Secondary | ICD-10-CM | POA: Diagnosis not present

## 2012-04-03 DIAGNOSIS — D509 Iron deficiency anemia, unspecified: Secondary | ICD-10-CM | POA: Diagnosis not present

## 2012-04-06 DIAGNOSIS — N186 End stage renal disease: Secondary | ICD-10-CM | POA: Diagnosis not present

## 2012-04-06 DIAGNOSIS — D509 Iron deficiency anemia, unspecified: Secondary | ICD-10-CM | POA: Diagnosis not present

## 2012-04-06 DIAGNOSIS — M869 Osteomyelitis, unspecified: Secondary | ICD-10-CM | POA: Diagnosis not present

## 2012-04-06 DIAGNOSIS — N2581 Secondary hyperparathyroidism of renal origin: Secondary | ICD-10-CM | POA: Diagnosis not present

## 2012-04-08 DIAGNOSIS — D509 Iron deficiency anemia, unspecified: Secondary | ICD-10-CM | POA: Diagnosis not present

## 2012-04-08 DIAGNOSIS — N186 End stage renal disease: Secondary | ICD-10-CM | POA: Diagnosis not present

## 2012-04-08 DIAGNOSIS — N2581 Secondary hyperparathyroidism of renal origin: Secondary | ICD-10-CM | POA: Diagnosis not present

## 2012-04-08 DIAGNOSIS — M869 Osteomyelitis, unspecified: Secondary | ICD-10-CM | POA: Diagnosis not present

## 2012-04-09 DIAGNOSIS — I871 Compression of vein: Secondary | ICD-10-CM | POA: Diagnosis not present

## 2012-04-09 DIAGNOSIS — T82898A Other specified complication of vascular prosthetic devices, implants and grafts, initial encounter: Secondary | ICD-10-CM | POA: Diagnosis not present

## 2012-04-10 DIAGNOSIS — M869 Osteomyelitis, unspecified: Secondary | ICD-10-CM | POA: Diagnosis not present

## 2012-04-10 DIAGNOSIS — D509 Iron deficiency anemia, unspecified: Secondary | ICD-10-CM | POA: Diagnosis not present

## 2012-04-10 DIAGNOSIS — N2581 Secondary hyperparathyroidism of renal origin: Secondary | ICD-10-CM | POA: Diagnosis not present

## 2012-04-10 DIAGNOSIS — N186 End stage renal disease: Secondary | ICD-10-CM | POA: Diagnosis not present

## 2012-04-13 DIAGNOSIS — M869 Osteomyelitis, unspecified: Secondary | ICD-10-CM | POA: Diagnosis not present

## 2012-04-13 DIAGNOSIS — N2581 Secondary hyperparathyroidism of renal origin: Secondary | ICD-10-CM | POA: Diagnosis not present

## 2012-04-13 DIAGNOSIS — D509 Iron deficiency anemia, unspecified: Secondary | ICD-10-CM | POA: Diagnosis not present

## 2012-04-13 DIAGNOSIS — N186 End stage renal disease: Secondary | ICD-10-CM | POA: Diagnosis not present

## 2012-04-14 ENCOUNTER — Other Ambulatory Visit: Payer: Self-pay | Admitting: Vascular Surgery

## 2012-04-14 DIAGNOSIS — L98499 Non-pressure chronic ulcer of skin of other sites with unspecified severity: Secondary | ICD-10-CM | POA: Diagnosis not present

## 2012-04-15 ENCOUNTER — Other Ambulatory Visit: Payer: Self-pay | Admitting: *Deleted

## 2012-04-15 DIAGNOSIS — N186 End stage renal disease: Secondary | ICD-10-CM | POA: Diagnosis not present

## 2012-04-15 DIAGNOSIS — N2581 Secondary hyperparathyroidism of renal origin: Secondary | ICD-10-CM | POA: Diagnosis not present

## 2012-04-15 DIAGNOSIS — M7989 Other specified soft tissue disorders: Secondary | ICD-10-CM

## 2012-04-15 DIAGNOSIS — M79609 Pain in unspecified limb: Secondary | ICD-10-CM

## 2012-04-15 DIAGNOSIS — M869 Osteomyelitis, unspecified: Secondary | ICD-10-CM | POA: Diagnosis not present

## 2012-04-15 DIAGNOSIS — D509 Iron deficiency anemia, unspecified: Secondary | ICD-10-CM | POA: Diagnosis not present

## 2012-04-17 DIAGNOSIS — D509 Iron deficiency anemia, unspecified: Secondary | ICD-10-CM | POA: Diagnosis not present

## 2012-04-17 DIAGNOSIS — N2581 Secondary hyperparathyroidism of renal origin: Secondary | ICD-10-CM | POA: Diagnosis not present

## 2012-04-17 DIAGNOSIS — N186 End stage renal disease: Secondary | ICD-10-CM | POA: Diagnosis not present

## 2012-04-17 DIAGNOSIS — M869 Osteomyelitis, unspecified: Secondary | ICD-10-CM | POA: Diagnosis not present

## 2012-04-19 DIAGNOSIS — D509 Iron deficiency anemia, unspecified: Secondary | ICD-10-CM | POA: Diagnosis not present

## 2012-04-19 DIAGNOSIS — M869 Osteomyelitis, unspecified: Secondary | ICD-10-CM | POA: Diagnosis not present

## 2012-04-19 DIAGNOSIS — N186 End stage renal disease: Secondary | ICD-10-CM | POA: Diagnosis not present

## 2012-04-19 DIAGNOSIS — N2581 Secondary hyperparathyroidism of renal origin: Secondary | ICD-10-CM | POA: Diagnosis not present

## 2012-04-21 DIAGNOSIS — M869 Osteomyelitis, unspecified: Secondary | ICD-10-CM | POA: Diagnosis not present

## 2012-04-21 DIAGNOSIS — N186 End stage renal disease: Secondary | ICD-10-CM | POA: Diagnosis not present

## 2012-04-21 DIAGNOSIS — N2581 Secondary hyperparathyroidism of renal origin: Secondary | ICD-10-CM | POA: Diagnosis not present

## 2012-04-21 DIAGNOSIS — D509 Iron deficiency anemia, unspecified: Secondary | ICD-10-CM | POA: Diagnosis not present

## 2012-04-24 DIAGNOSIS — D509 Iron deficiency anemia, unspecified: Secondary | ICD-10-CM | POA: Diagnosis not present

## 2012-04-24 DIAGNOSIS — M869 Osteomyelitis, unspecified: Secondary | ICD-10-CM | POA: Diagnosis not present

## 2012-04-24 DIAGNOSIS — N2581 Secondary hyperparathyroidism of renal origin: Secondary | ICD-10-CM | POA: Diagnosis not present

## 2012-04-24 DIAGNOSIS — N186 End stage renal disease: Secondary | ICD-10-CM | POA: Diagnosis not present

## 2012-04-25 DIAGNOSIS — I120 Hypertensive chronic kidney disease with stage 5 chronic kidney disease or end stage renal disease: Secondary | ICD-10-CM | POA: Diagnosis not present

## 2012-04-25 DIAGNOSIS — L97409 Non-pressure chronic ulcer of unspecified heel and midfoot with unspecified severity: Secondary | ICD-10-CM | POA: Diagnosis not present

## 2012-04-25 DIAGNOSIS — Z992 Dependence on renal dialysis: Secondary | ICD-10-CM | POA: Diagnosis not present

## 2012-04-25 DIAGNOSIS — N186 End stage renal disease: Secondary | ICD-10-CM | POA: Diagnosis not present

## 2012-04-26 DIAGNOSIS — N2581 Secondary hyperparathyroidism of renal origin: Secondary | ICD-10-CM | POA: Diagnosis not present

## 2012-04-26 DIAGNOSIS — N186 End stage renal disease: Secondary | ICD-10-CM | POA: Diagnosis not present

## 2012-04-26 DIAGNOSIS — D509 Iron deficiency anemia, unspecified: Secondary | ICD-10-CM | POA: Diagnosis not present

## 2012-04-26 DIAGNOSIS — M869 Osteomyelitis, unspecified: Secondary | ICD-10-CM | POA: Diagnosis not present

## 2012-04-28 DIAGNOSIS — N186 End stage renal disease: Secondary | ICD-10-CM | POA: Diagnosis not present

## 2012-04-28 DIAGNOSIS — M869 Osteomyelitis, unspecified: Secondary | ICD-10-CM | POA: Diagnosis not present

## 2012-04-28 DIAGNOSIS — D509 Iron deficiency anemia, unspecified: Secondary | ICD-10-CM | POA: Diagnosis not present

## 2012-04-28 DIAGNOSIS — N2581 Secondary hyperparathyroidism of renal origin: Secondary | ICD-10-CM | POA: Diagnosis not present

## 2012-05-01 DIAGNOSIS — N2581 Secondary hyperparathyroidism of renal origin: Secondary | ICD-10-CM | POA: Diagnosis not present

## 2012-05-01 DIAGNOSIS — M869 Osteomyelitis, unspecified: Secondary | ICD-10-CM | POA: Diagnosis not present

## 2012-05-01 DIAGNOSIS — N186 End stage renal disease: Secondary | ICD-10-CM | POA: Diagnosis not present

## 2012-05-01 DIAGNOSIS — D509 Iron deficiency anemia, unspecified: Secondary | ICD-10-CM | POA: Diagnosis not present

## 2012-05-04 ENCOUNTER — Encounter: Payer: Self-pay | Admitting: Vascular Surgery

## 2012-05-05 ENCOUNTER — Ambulatory Visit (INDEPENDENT_AMBULATORY_CARE_PROVIDER_SITE_OTHER): Payer: Medicare Other | Admitting: Vascular Surgery

## 2012-05-05 ENCOUNTER — Encounter (INDEPENDENT_AMBULATORY_CARE_PROVIDER_SITE_OTHER): Payer: Medicare Other | Admitting: *Deleted

## 2012-05-05 ENCOUNTER — Encounter: Payer: Self-pay | Admitting: Vascular Surgery

## 2012-05-05 VITALS — BP 159/84 | HR 93 | Resp 16 | Ht 65.0 in | Wt 186.7 lb

## 2012-05-05 DIAGNOSIS — L98499 Non-pressure chronic ulcer of skin of other sites with unspecified severity: Secondary | ICD-10-CM | POA: Diagnosis not present

## 2012-05-05 DIAGNOSIS — Z48812 Encounter for surgical aftercare following surgery on the circulatory system: Secondary | ICD-10-CM | POA: Diagnosis not present

## 2012-05-05 DIAGNOSIS — Z992 Dependence on renal dialysis: Secondary | ICD-10-CM | POA: Diagnosis not present

## 2012-05-05 DIAGNOSIS — M7989 Other specified soft tissue disorders: Secondary | ICD-10-CM

## 2012-05-05 DIAGNOSIS — T82898A Other specified complication of vascular prosthetic devices, implants and grafts, initial encounter: Secondary | ICD-10-CM

## 2012-05-05 DIAGNOSIS — M79609 Pain in unspecified limb: Secondary | ICD-10-CM | POA: Diagnosis not present

## 2012-05-05 DIAGNOSIS — N19 Unspecified kidney failure: Secondary | ICD-10-CM | POA: Diagnosis not present

## 2012-05-05 DIAGNOSIS — N184 Chronic kidney disease, stage 4 (severe): Secondary | ICD-10-CM | POA: Diagnosis not present

## 2012-05-05 DIAGNOSIS — I739 Peripheral vascular disease, unspecified: Secondary | ICD-10-CM | POA: Diagnosis not present

## 2012-05-05 DIAGNOSIS — L97509 Non-pressure chronic ulcer of other part of unspecified foot with unspecified severity: Secondary | ICD-10-CM | POA: Diagnosis not present

## 2012-05-05 DIAGNOSIS — N186 End stage renal disease: Secondary | ICD-10-CM | POA: Diagnosis not present

## 2012-05-05 NOTE — Progress Notes (Signed)
  VASCULAR AND VEIN SURGERY PROGRESS NOTE  FOLLOW-UP HEMODIALYSIS ACCESS  Nephrologist : Dr Lowanda Foster  HPI: [name] is a 72 y.o. male who has a functioning BC AVF inright upper extremity. The patient complains of symptoms of numbness, tingling; aching pain pain in the Right hand. he states it sometimes wakes him up at night and is relieved with hand in dependent position. Pt has normal function and grip in the hand. This is essentially unchanged from his previous visit in May of 2013 . He is RHD  Significant Diagnostic Studies: CBC Lab Results  Component Value Date   WBC 10.8* 10/03/2011   HGB 19.7* 01/24/2012   HCT 58.0* 01/24/2012   MCV 69.9* 10/03/2011   PLT 351 10/03/2011    BMET    Component Value Date/Time   NA 136 01/24/2012 0841   K 4.3 01/24/2012 0841   CL 96 01/24/2012 0841   CO2 23 01/24/2012 0841   GLUCOSE 79 01/24/2012 0841   BUN 31* 01/24/2012 0841   CREATININE 11.66* 01/24/2012 0841   CALCIUM 9.1 01/24/2012 0841   GFRNONAA 4* 01/24/2012 0841   GFRAA 4* 01/24/2012 0841    COAG Lab Results  Component Value Date   INR 1.00 09/19/2011   INR 0.95 07/13/2010   No results found for this basename: PTT    Vital Signs  BP Readings from Last 3 Encounters:  05/05/12 159/84  01/24/12 130/78  01/24/12 130/78   Temp Readings from Last 3 Encounters:  01/24/12 98 F (36.7 C) Oral  01/24/12 98 F (36.7 C) Oral  01/16/12 98.1 F (36.7 C) Oral   SpO2 Readings from Last 3 Encounters:  01/24/12 97%  01/24/12 97%  01/16/12 98%   Pulse Readings from Last 3 Encounters:  05/05/12 93  01/24/12 81  01/24/12 81     Physical Examination  right upper Incision is healing well, skin color is normal , hand grip is 5/5, sensation in digits is mildly decreased as compared to left. There is no muscle wasting in the hand.  There is a good thrill and good bruit in the [facility] AVF and it appears they are using button hole technique in HD.  Assessment/Plan [name] is a 72 y.o. year  old male who presents s/p creation/revision of right upper extremity Hemodialysis access. He continues to have mild steal symptoms with numbness in the 1st through fourth fingers of the right hand but is able to use the hand normally. He does not want [place] to occlude the fistula as it works very well.  We will refer pt to hand therapy to see if they can help improve his symptoms.  [place] J 05/05/2012 4:41 PM    I have examined the patient, reviewed and agree with above. He did undergo right arm graft duplex today. This showed some area of webbing near the arteriovenous anastomosis and some thrombus in the stented area of his fistula. He does have good radial ulnar triphasic signals. Physical exam he does have palpable radial pulse. I again had a long discussion explaining the only option would be ligation of his fistula and placement of a leg graft. He reports he is able to tolerate the ischemic symptoms. We have referred him to hand therapy to see if he can have improvement of his symptoms  EARLY, TODD, MD 05/05/2012 5:05 PM

## 2012-05-06 DIAGNOSIS — N186 End stage renal disease: Secondary | ICD-10-CM | POA: Diagnosis not present

## 2012-05-21 DIAGNOSIS — I10 Essential (primary) hypertension: Secondary | ICD-10-CM | POA: Diagnosis not present

## 2012-05-21 DIAGNOSIS — N19 Unspecified kidney failure: Secondary | ICD-10-CM | POA: Diagnosis not present

## 2012-05-29 DIAGNOSIS — N186 End stage renal disease: Secondary | ICD-10-CM | POA: Diagnosis not present

## 2012-06-01 DIAGNOSIS — N186 End stage renal disease: Secondary | ICD-10-CM | POA: Diagnosis not present

## 2012-06-01 DIAGNOSIS — D509 Iron deficiency anemia, unspecified: Secondary | ICD-10-CM | POA: Diagnosis not present

## 2012-06-01 DIAGNOSIS — N2581 Secondary hyperparathyroidism of renal origin: Secondary | ICD-10-CM | POA: Diagnosis not present

## 2012-06-03 DIAGNOSIS — N186 End stage renal disease: Secondary | ICD-10-CM | POA: Diagnosis not present

## 2012-06-26 DIAGNOSIS — N186 End stage renal disease: Secondary | ICD-10-CM | POA: Diagnosis not present

## 2012-06-29 DIAGNOSIS — N186 End stage renal disease: Secondary | ICD-10-CM | POA: Diagnosis not present

## 2012-06-29 DIAGNOSIS — R319 Hematuria, unspecified: Secondary | ICD-10-CM | POA: Diagnosis not present

## 2012-06-29 DIAGNOSIS — N19 Unspecified kidney failure: Secondary | ICD-10-CM | POA: Diagnosis not present

## 2012-06-29 DIAGNOSIS — R310 Gross hematuria: Secondary | ICD-10-CM | POA: Diagnosis not present

## 2012-06-29 DIAGNOSIS — R112 Nausea with vomiting, unspecified: Secondary | ICD-10-CM | POA: Diagnosis not present

## 2012-06-29 DIAGNOSIS — D509 Iron deficiency anemia, unspecified: Secondary | ICD-10-CM | POA: Diagnosis not present

## 2012-06-29 DIAGNOSIS — N2581 Secondary hyperparathyroidism of renal origin: Secondary | ICD-10-CM | POA: Diagnosis not present

## 2012-07-01 DIAGNOSIS — N186 End stage renal disease: Secondary | ICD-10-CM | POA: Diagnosis not present

## 2012-07-01 DIAGNOSIS — D509 Iron deficiency anemia, unspecified: Secondary | ICD-10-CM | POA: Diagnosis not present

## 2012-07-01 DIAGNOSIS — R112 Nausea with vomiting, unspecified: Secondary | ICD-10-CM | POA: Diagnosis not present

## 2012-07-01 DIAGNOSIS — N2581 Secondary hyperparathyroidism of renal origin: Secondary | ICD-10-CM | POA: Diagnosis not present

## 2012-07-02 DIAGNOSIS — N189 Chronic kidney disease, unspecified: Secondary | ICD-10-CM | POA: Diagnosis not present

## 2012-07-02 DIAGNOSIS — R310 Gross hematuria: Secondary | ICD-10-CM | POA: Diagnosis not present

## 2012-07-04 DIAGNOSIS — N2581 Secondary hyperparathyroidism of renal origin: Secondary | ICD-10-CM | POA: Diagnosis not present

## 2012-07-04 DIAGNOSIS — R112 Nausea with vomiting, unspecified: Secondary | ICD-10-CM | POA: Diagnosis not present

## 2012-07-04 DIAGNOSIS — D509 Iron deficiency anemia, unspecified: Secondary | ICD-10-CM | POA: Diagnosis not present

## 2012-07-04 DIAGNOSIS — N186 End stage renal disease: Secondary | ICD-10-CM | POA: Diagnosis not present

## 2012-07-06 DIAGNOSIS — R112 Nausea with vomiting, unspecified: Secondary | ICD-10-CM | POA: Diagnosis not present

## 2012-07-06 DIAGNOSIS — D509 Iron deficiency anemia, unspecified: Secondary | ICD-10-CM | POA: Diagnosis not present

## 2012-07-06 DIAGNOSIS — N186 End stage renal disease: Secondary | ICD-10-CM | POA: Diagnosis not present

## 2012-07-06 DIAGNOSIS — N2581 Secondary hyperparathyroidism of renal origin: Secondary | ICD-10-CM | POA: Diagnosis not present

## 2012-07-08 DIAGNOSIS — D509 Iron deficiency anemia, unspecified: Secondary | ICD-10-CM | POA: Diagnosis not present

## 2012-07-08 DIAGNOSIS — N186 End stage renal disease: Secondary | ICD-10-CM | POA: Diagnosis not present

## 2012-07-08 DIAGNOSIS — N2581 Secondary hyperparathyroidism of renal origin: Secondary | ICD-10-CM | POA: Diagnosis not present

## 2012-07-08 DIAGNOSIS — R112 Nausea with vomiting, unspecified: Secondary | ICD-10-CM | POA: Diagnosis not present

## 2012-07-09 DIAGNOSIS — T82898A Other specified complication of vascular prosthetic devices, implants and grafts, initial encounter: Secondary | ICD-10-CM | POA: Diagnosis not present

## 2012-07-09 DIAGNOSIS — I871 Compression of vein: Secondary | ICD-10-CM | POA: Diagnosis not present

## 2012-07-09 DIAGNOSIS — N186 End stage renal disease: Secondary | ICD-10-CM | POA: Diagnosis not present

## 2012-07-09 DIAGNOSIS — I771 Stricture of artery: Secondary | ICD-10-CM | POA: Diagnosis not present

## 2012-07-10 DIAGNOSIS — D509 Iron deficiency anemia, unspecified: Secondary | ICD-10-CM | POA: Diagnosis not present

## 2012-07-10 DIAGNOSIS — R112 Nausea with vomiting, unspecified: Secondary | ICD-10-CM | POA: Diagnosis not present

## 2012-07-10 DIAGNOSIS — N186 End stage renal disease: Secondary | ICD-10-CM | POA: Diagnosis not present

## 2012-07-10 DIAGNOSIS — N2581 Secondary hyperparathyroidism of renal origin: Secondary | ICD-10-CM | POA: Diagnosis not present

## 2012-07-12 ENCOUNTER — Encounter (HOSPITAL_COMMUNITY): Payer: Self-pay | Admitting: *Deleted

## 2012-07-12 ENCOUNTER — Emergency Department (HOSPITAL_COMMUNITY)
Admission: EM | Admit: 2012-07-12 | Discharge: 2012-07-12 | Disposition: A | Discharge status: Home / Self Care | Payer: Medicare Other | Attending: Emergency Medicine | Admitting: Emergency Medicine

## 2012-07-12 DIAGNOSIS — K029 Dental caries, unspecified: Secondary | ICD-10-CM | POA: Diagnosis not present

## 2012-07-12 DIAGNOSIS — K089 Disorder of teeth and supporting structures, unspecified: Secondary | ICD-10-CM | POA: Diagnosis not present

## 2012-07-12 DIAGNOSIS — Z79899 Other long term (current) drug therapy: Secondary | ICD-10-CM | POA: Insufficient documentation

## 2012-07-12 DIAGNOSIS — Z8673 Personal history of transient ischemic attack (TIA), and cerebral infarction without residual deficits: Secondary | ICD-10-CM | POA: Diagnosis not present

## 2012-07-12 DIAGNOSIS — Z87891 Personal history of nicotine dependence: Secondary | ICD-10-CM | POA: Diagnosis not present

## 2012-07-12 DIAGNOSIS — N186 End stage renal disease: Secondary | ICD-10-CM | POA: Diagnosis not present

## 2012-07-12 DIAGNOSIS — R5381 Other malaise: Secondary | ICD-10-CM | POA: Diagnosis not present

## 2012-07-12 DIAGNOSIS — Z992 Dependence on renal dialysis: Secondary | ICD-10-CM | POA: Diagnosis not present

## 2012-07-12 DIAGNOSIS — R42 Dizziness and giddiness: Secondary | ICD-10-CM | POA: Diagnosis not present

## 2012-07-12 DIAGNOSIS — K0889 Other specified disorders of teeth and supporting structures: Secondary | ICD-10-CM

## 2012-07-12 DIAGNOSIS — Z8679 Personal history of other diseases of the circulatory system: Secondary | ICD-10-CM | POA: Diagnosis not present

## 2012-07-12 DIAGNOSIS — R112 Nausea with vomiting, unspecified: Secondary | ICD-10-CM | POA: Diagnosis not present

## 2012-07-12 DIAGNOSIS — I120 Hypertensive chronic kidney disease with stage 5 chronic kidney disease or end stage renal disease: Secondary | ICD-10-CM | POA: Insufficient documentation

## 2012-07-12 DIAGNOSIS — Z8719 Personal history of other diseases of the digestive system: Secondary | ICD-10-CM | POA: Insufficient documentation

## 2012-07-12 MED ORDER — PENICILLIN V POTASSIUM 500 MG PO TABS: 500.0000 mg | ORAL_TABLET | Freq: Four times a day (QID) | ORAL | Status: AC

## 2012-07-12 MED ORDER — PENICILLIN V POTASSIUM 250 MG PO TABS
500.0000 mg | ORAL_TABLET | Freq: Once | ORAL | Status: AC
  Administered 2012-07-12: 500 mg via ORAL
  Filled 2012-07-12: qty 2

## 2012-07-12 MED ORDER — HYDROCODONE-ACETAMINOPHEN 5-325 MG PO TABS
1.0000 | ORAL_TABLET | Freq: Once | ORAL | Status: AC
  Administered 2012-07-12: 1 via ORAL
  Filled 2012-07-12: qty 1

## 2012-07-12 MED ORDER — HYDROCODONE-ACETAMINOPHEN 5-325 MG PO TABS: 1.0000 | ORAL_TABLET | ORAL | Status: DC | PRN

## 2012-07-12 NOTE — ED Provider Notes (Signed)
History     CSN: [id number]  Arrival date & time 07/12/12  0335   First MD Initiated Contact with Patient 07/12/12 [phone]      Chief Complaint  Patient presents with  . Dental Pain    (Consider location/radiation/quality/duration/timing/severity/associated sxs/prior treatment) HPI [name] is a 72 y.o. male  With a h/o ESRD, HTN, CVA, LVH who presents to the Emergency Department complaining of dental pain that began yesterday. Left upper tooth with pain. He has been using ibuprofen without relief. He could not wait until Monday to see the dentist. Denies fever, chills, discharge from the tooth.  Past Medical History  Diagnosis Date  . Hypertension   . ESRD (end stage renal disease) on dialysis   . CVA (cerebral infarction)   . Gastric ulcer 2004    treated for h.pylori see PSH  . LVH (left ventricular hypertrophy)   . Stroke     Past Surgical History  Procedure Laterality Date  . Cholecystectomy    . Arteriovenous graft placement    . Shoulder surgery    . Colonoscopy  2004    Dr. Irving Shows, left sided diverticula and cecal polyp, path unknown  . Esophagogastroduodenoscopy  11/2002    Dr. Gala Romney, erosive reflux esophagitis, multiple gastric ulcer and antral/bulbar erosions. Serologies positive for H.Pylori and was treated  . Esophagogastroduodenoscopy  11/20014    Dr. Gala Romney, small hh only, ulcers healed  . Esophagogastroduodenoscopy  09/21/2011    Dr Trevor [facility], antral erosions, ?early GAVE  . Colonoscopy  10/29/2011    Procedure: COLONOSCOPY;  Surgeon: Daneil Dolin, MD;  Location: [facility];  Service: Endoscopy;  Laterality: N/A;  10:15    Family History  Problem Relation Age of Onset  . Colon cancer Neg Hx   . Liver disease Neg Hx     History  Substance Use Topics  . Smoking status: Former Smoker    Quit date: 04/29/2004  . Smokeless tobacco: Former Systems developer    Types: [place] date: 01/16/1987     Comment: quit 2006  . Alcohol Use: No       Review of Systems  Constitutional: Negative for fever.       10 Systems reviewed and are negative for acute change except as noted in the HPI.  HENT: Positive for dental problem. Negative for congestion.   Eyes: Negative for discharge and redness.  Respiratory: Negative for cough and shortness of breath.   Cardiovascular: Negative for chest pain.  Gastrointestinal: Negative for vomiting and abdominal pain.  Musculoskeletal: Negative for back pain.  Skin: Negative for rash.  Neurological: Negative for syncope, numbness and headaches.  Psychiatric/Behavioral:       No behavior change.    Allergies  Aspirin  Home Medications   Current Outpatient Rx  Name  Route  Sig  Dispense  Refill  . ALPRAZolam (XANAX) 0.25 MG tablet   Oral   Take 0.25 mg by mouth 3 (three) times daily.         Marland Kitchen amLODipine (NORVASC) 5 MG tablet   Oral   Take 5 mg by mouth daily.         Marland Kitchen b complex-vitamin c-folic acid (NEPHRO-VITE) 0.8 MG TABS   Oral   Take 0.8 mg by mouth as directed. Takes on Tuesdays, Thursdays, Saturdays, and Sundays. Does not take on Mondays, Wednesdays, and Fridays due to Dialysis treatments.         . cinacalcet (SENSIPAR) 60 MG  tablet   Oral   Take 60 mg by mouth every evening. With evening meal         . hydrOXYzine (ATARAX/VISTARIL) 10 MG tablet   Oral   Take 10 mg by mouth as needed. For itching         . metoprolol succinate (TOPROL-XL) 50 MG 24 hr tablet   Oral   Take 50 mg by mouth daily.          . promethazine (PHENERGAN) 25 MG tablet   Oral   Take 12.5 mg by mouth Daily.         . sevelamer (RENVELA) 800 MG tablet   Oral   Take 2,400-4,000 mg by mouth 3 (three) times daily with meals. Patient takes 5 tablets with meals and 3 tablets with snacks           BP 127/81  Pulse 95  Temp(Src) 97.7 F (36.5 C) (Oral)  Resp 16  Ht 5\' 5"  (1.651 m)  Wt 184 lb (83.462 kg)  BMI 30.62 kg/m2  SpO2 98%  Physical Exam  Nursing note and  vitals reviewed. Constitutional: He appears well-developed and well-nourished.  Awake, alert, nontoxic appearance.  HENT:  Head: Atraumatic.  Essentially edentulous. Tooth 12 is the tooth is question which is loose and without evidence of infection. No abscess is present.  Eyes: Right eye exhibits no discharge. Left eye exhibits no discharge.  Neck: Neck supple.  Pulmonary/Chest: Effort normal. He exhibits no tenderness.  Abdominal: Soft. There is no tenderness. There is no rebound.  Musculoskeletal: He exhibits no tenderness.  Baseline ROM, no obvious new focal weakness.  Neurological:  Mental status and motor strength appears baseline for patient and situation.  Skin: No rash noted.  AVG with good bruit and thrill  Psychiatric: He has a normal mood and affect.    ED Course  Procedures (including critical care time)     MDM  Patient with very poor dentition here with dental pain. Given hydrocodone and penicillin. He is to follow up with a dentist on Monday.Pt stable in ED with no significant deterioration in condition.The patient appears reasonably screened and/or stabilized for discharge and I doubt any other medical condition or other [facility] requiring further screening, evaluation, or treatment in the ED at this time prior to discharge.  MDM Reviewed: nursing note and vitals           Gypsy Balsam. Olin Hauser, MD 07/12/12 [phone]

## 2012-07-12 NOTE — ED Notes (Signed)
Pt reporting "real bad toothache" on left side.  States pain started Friday. Pt unable to see dentist as they were closed on Friday and he has dialysis on Monday.  States earliest he will be able to see dentist is Tuesday.

## 2012-07-13 ENCOUNTER — Emergency Department (HOSPITAL_COMMUNITY)
Admission: EM | Admit: 2012-07-13 | Discharge: 2012-07-13 | Disposition: A | Discharge status: Home / Self Care | Payer: Medicare Other | Attending: Emergency Medicine | Admitting: Emergency Medicine

## 2012-07-13 ENCOUNTER — Emergency Department (HOSPITAL_COMMUNITY): Payer: Medicare Other

## 2012-07-13 ENCOUNTER — Encounter (HOSPITAL_COMMUNITY): Payer: Self-pay | Admitting: *Deleted

## 2012-07-13 DIAGNOSIS — N2581 Secondary hyperparathyroidism of renal origin: Secondary | ICD-10-CM | POA: Diagnosis not present

## 2012-07-13 DIAGNOSIS — R5381 Other malaise: Secondary | ICD-10-CM | POA: Diagnosis not present

## 2012-07-13 DIAGNOSIS — K029 Dental caries, unspecified: Secondary | ICD-10-CM | POA: Diagnosis not present

## 2012-07-13 DIAGNOSIS — Z87891 Personal history of nicotine dependence: Secondary | ICD-10-CM | POA: Insufficient documentation

## 2012-07-13 DIAGNOSIS — Z79899 Other long term (current) drug therapy: Secondary | ICD-10-CM | POA: Insufficient documentation

## 2012-07-13 DIAGNOSIS — Z8719 Personal history of other diseases of the digestive system: Secondary | ICD-10-CM | POA: Insufficient documentation

## 2012-07-13 DIAGNOSIS — Z8673 Personal history of transient ischemic attack (TIA), and cerebral infarction without residual deficits: Secondary | ICD-10-CM | POA: Insufficient documentation

## 2012-07-13 DIAGNOSIS — R42 Dizziness and giddiness: Secondary | ICD-10-CM | POA: Diagnosis not present

## 2012-07-13 DIAGNOSIS — Z9089 Acquired absence of other organs: Secondary | ICD-10-CM | POA: Insufficient documentation

## 2012-07-13 DIAGNOSIS — N186 End stage renal disease: Secondary | ICD-10-CM | POA: Diagnosis not present

## 2012-07-13 DIAGNOSIS — D509 Iron deficiency anemia, unspecified: Secondary | ICD-10-CM | POA: Diagnosis not present

## 2012-07-13 DIAGNOSIS — Z992 Dependence on renal dialysis: Secondary | ICD-10-CM | POA: Insufficient documentation

## 2012-07-13 DIAGNOSIS — I120 Hypertensive chronic kidney disease with stage 5 chronic kidney disease or end stage renal disease: Secondary | ICD-10-CM | POA: Diagnosis not present

## 2012-07-13 DIAGNOSIS — R112 Nausea with vomiting, unspecified: Secondary | ICD-10-CM | POA: Diagnosis not present

## 2012-07-13 DIAGNOSIS — Z8679 Personal history of other diseases of the circulatory system: Secondary | ICD-10-CM | POA: Insufficient documentation

## 2012-07-13 LAB — COMPREHENSIVE METABOLIC PANEL
ALT: 7 U/L (ref 0–53)
AST: 14 U/L (ref 0–37)
Albumin: 3.2 g/dL — ABNORMAL LOW (ref 3.5–5.2)
CO2: 26 mEq/L (ref 19–32)
Calcium: 7.8 mg/dL — ABNORMAL LOW (ref 8.4–10.5)
Chloride: 93 mEq/L — ABNORMAL LOW (ref 96–112)
GFR calc non Af Amer: 5 mL/min — ABNORMAL LOW (ref >90)
Sodium: 136 mEq/L (ref 135–145)

## 2012-07-13 LAB — CBC WITH DIFFERENTIAL/PLATELET
Basophils Relative: 0 % (ref 0–1)
Eosinophils Absolute: 0.3 10*3/uL (ref 0.0–0.7)
Eosinophils Relative: 2 % (ref 0–5)
Hemoglobin: 15.6 g/dL (ref 13.0–17.0)
Lymphocytes Relative: 9 % — ABNORMAL LOW (ref 12–46)
Neutrophils Relative %: 83 % — ABNORMAL HIGH (ref 43–77)
RBC: 7.18 MIL/uL — ABNORMAL HIGH (ref 4.22–5.81)
Smear Review: ADEQUATE

## 2012-07-13 MED ORDER — ONDANSETRON HCL 4 MG/2ML IJ SOLN
4.0000 mg | INTRAMUSCULAR | Status: AC | PRN
  Administered 2012-07-13 (×2): 4 mg via INTRAVENOUS
  Filled 2012-07-13 (×2): qty 2

## 2012-07-13 MED ORDER — SODIUM CHLORIDE 0.9 % IV SOLN
INTRAVENOUS | Status: DC
  Administered 2012-07-13: 22:00:00 via INTRAVENOUS

## 2012-07-13 MED ORDER — ONDANSETRON HCL 4 MG/2ML IJ SOLN: 4.0000 mg | Freq: Once | INTRAMUSCULAR | Status: DC

## 2012-07-13 NOTE — ED Provider Notes (Signed)
Results for orders placed during the hospital encounter of 07/13/12  CBC WITH DIFFERENTIAL      Result Value Range   WBC 12.6 (*) 4.0 - 10.5 K/uL   RBC 7.18 (*) 4.22 - 5.81 MIL/uL   Hemoglobin 15.6  13.0 - 17.0 g/dL   HCT 47.0  39.0 - 52.0 %   MCV 65.5 (*) 78.0 - 100.0 fL   MCH 21.7 (*) 26.0 - 34.0 pg   MCHC 33.2  30.0 - 36.0 g/dL   RDW 19.1 (*) 11.5 - 15.5 %   Platelets 296  150 - 400 K/uL   Neutrophils Relative 83 (*) 43 - 77 %   Lymphocytes Relative 9 (*) 12 - 46 %   Monocytes Relative 6  3 - 12 %   Eosinophils Relative 2  0 - 5 %   Basophils Relative 0  0 - 1 %   Neutro Abs 10.4 (*) 1.7 - 7.7 K/uL   Lymphs Abs 1.1  0.7 - 4.0 K/uL   Monocytes Absolute 0.8  0.1 - 1.0 K/uL   Eosinophils Absolute 0.3  0.0 - 0.7 K/uL   Basophils Absolute 0.0  0.0 - 0.1 K/uL   RBC Morphology TARGET CELLS     Smear Review       Value: PLATELET CLUMPS NOTED ON SMEAR, COUNT APPEARS ADEQUATE  COMPREHENSIVE METABOLIC PANEL      Result Value Range   Sodium 136  135 - 145 mEq/L   Potassium 4.1  3.5 - 5.1 mEq/L   Chloride 93 (*) 96 - 112 mEq/L   CO2 26  19 - 32 mEq/L   Glucose, Bld 104 (*) 70 - 99 mg/dL   BUN 22  6 - 23 mg/dL   Creatinine, Ser 9.19 (*) 0.50 - 1.35 mg/dL   Calcium 7.8 (*) 8.4 - 10.5 mg/dL   Total Protein 8.4 (*) 6.0 - 8.3 g/dL   Albumin 3.2 (*) 3.5 - 5.2 g/dL   AST 14  0 - 37 U/L   ALT 7  0 - 53 U/L   Alkaline Phosphatase 86  39 - 117 U/L   Total Bilirubin 0.1 (*) 0.3 - 1.2 mg/dL   GFR calc non Af Amer 5 (*) >90 mL/min   GFR calc Af Amer 6 (*) >90 mL/min  LIPASE, BLOOD      Result Value Range   Lipase 18  11 - 59 U/L  TROPONIN I      Result Value Range   Troponin I <0.30  <0.30 ng/mL   Results for orders placed during the hospital encounter of 07/13/12  CBC WITH DIFFERENTIAL      Result Value Range   WBC 12.6 (*) 4.0 - 10.5 K/uL   RBC 7.18 (*) 4.22 - 5.81 MIL/uL   Hemoglobin 15.6  13.0 - 17.0 g/dL   HCT 47.0  39.0 - 52.0 %   MCV 65.5 (*) 78.0 - 100.0 fL   MCH 21.7 (*)  26.0 - 34.0 pg   MCHC 33.2  30.0 - 36.0 g/dL   RDW 19.1 (*) 11.5 - 15.5 %   Platelets 296  150 - 400 K/uL   Neutrophils Relative 83 (*) 43 - 77 %   Lymphocytes Relative 9 (*) 12 - 46 %   Monocytes Relative 6  3 - 12 %   Eosinophils Relative 2  0 - 5 %   Basophils Relative 0  0 - 1 %   Neutro Abs 10.4 (*) 1.7 - 7.7 K/uL  Lymphs Abs 1.1  0.7 - 4.0 K/uL   Monocytes Absolute 0.8  0.1 - 1.0 K/uL   Eosinophils Absolute 0.3  0.0 - 0.7 K/uL   Basophils Absolute 0.0  0.0 - 0.1 K/uL   RBC Morphology TARGET CELLS     Smear Review       Value: PLATELET CLUMPS NOTED ON SMEAR, COUNT APPEARS ADEQUATE  COMPREHENSIVE METABOLIC PANEL      Result Value Range   Sodium 136  135 - 145 mEq/L   Potassium 4.1  3.5 - 5.1 mEq/L   Chloride 93 (*) 96 - 112 mEq/L   CO2 26  19 - 32 mEq/L   Glucose, Bld 104 (*) 70 - 99 mg/dL   BUN 22  6 - 23 mg/dL   Creatinine, Ser 9.19 (*) 0.50 - 1.35 mg/dL   Calcium 7.8 (*) 8.4 - 10.5 mg/dL   Total Protein 8.4 (*) 6.0 - 8.3 g/dL   Albumin 3.2 (*) 3.5 - 5.2 g/dL   AST 14  0 - 37 U/L   ALT 7  0 - 53 U/L   Alkaline Phosphatase 86  39 - 117 U/L   Total Bilirubin 0.1 (*) 0.3 - 1.2 mg/dL   GFR calc non Af Amer 5 (*) >90 mL/min   GFR calc Af Amer 6 (*) >90 mL/min  LIPASE, BLOOD      Result Value Range   Lipase 18  11 - 59 U/L  TROPONIN I      Result Value Range   Troponin I <0.30  <0.30 ng/mL   Dg Chest 2 View  07/13/2012  *RADIOLOGY REPORT*  Clinical Data: Nausea, vomiting, and dizziness tonight.  The patient had dialysis today.  CHEST - 2 VIEW  Comparison: 07/11/2011  Findings: Shallow inspiration. The heart size and pulmonary vascularity are normal. The lungs appear clear and expanded without focal air space disease or consolidation. No blunting of the costophrenic angles.  No pneumothorax.  Mediastinal contours appear intact.  Calcified and tortuous aorta.  Probable calcified granulomas in the right hilum.  Degenerative changes in the spine. No significant change since  previous study.  IMPRESSION: Shallow inspiration.  No evidence of active pulmonary disease.   Original Report Authenticated By: Lucienne Capers, M.D.    Ct Head Wo Contrast  07/13/2012  *RADIOLOGY REPORT*  Clinical Data: Acute onset dizziness and nausea/vomiting 1 hour ago.  Generalized weakness.  End-stage renal disease on hemodialysis, and the patient had dialysis earlier today.  CT HEAD WITHOUT CONTRAST  Technique:  Contiguous axial images were obtained from the base of the skull through the vertex without contrast.  Comparison: None.  Findings: Ventricular system normal in size and appearance for age. Mild cortical atrophy consistent with age.  Mild changes of small vessel disease of the white matter.  Physiologic calcifications in the basal ganglia.  No mass lesion.  No midline shift.  No acute hemorrhage or hematoma.  No extra-axial fluid collections.  No evidence of acute infarction.  No skull fracture or other focal osseous abnormality involving the skull.  Visualized paranasal sinuses, bilateral mastoid air cells, and bilateral middle ear cavities well-aerated.  Bilateral carotid siphon and vertebrobasilar atherosclerosis.  IMPRESSION:  1.  No acute intracranial abnormality. 2.  Mild chronic microvascular ischemic changes of the white matter.   Original Report Authenticated By: Evangeline Dakin, M.D.     Workup in the emergency department without any significant abnormalities. Labs other than being consistent with his end-stage renal disease have no significant  abnormalities. Patient cleared for discharge home. Patient was turned over to me by Dr. Thurnell Garbe. Patient's cleared to be discharged home and followup with his doctor and dentist.  Mervin Kung, MD 07/13/12 2223

## 2012-07-13 NOTE — ED Notes (Signed)
Seen here yesterday for dental pain, went to dentist today and too much infection to remove tooth,  1 hour ago became dizzy and vomiting.

## 2012-07-13 NOTE — ED Provider Notes (Signed)
History     CSN: [id number]  Arrival date & time 07/13/12  2002   First MD Initiated Contact with Patient 07/13/12 2017      Chief Complaint  Patient presents with  . Dizziness     HPI Pt was seen at 2035.   Per pt, c/o gradual onset and persistence of constant "lightheadedness" that began approx 1 hour PTA.  Has been associated with an episode of N/V.  Pt describes the lightheadedness as "I feel like I'm going to pass out."  States he was evaluated by his Dentist today for a left upper "tooth infection" that the dentist wishes to "treat with antibiotics first before he pulls it."  Denies worsening of dental pain, no intra-oral edema, no intra-oral drainage. Pt endorses he had his usual full HD treatment today.  Pt denies fevers, no sore throat, no neck pain, no diarrhea, no black or blood in stools or emesis, no CP/SOB, no abd pain, no back pain, no rash, no dysphagia.    Past Medical History  Diagnosis Date  . Hypertension   . CVA (cerebral infarction)   . Gastric ulcer 2004    treated for h.pylori see PSH  . LVH (left ventricular hypertrophy)   . Stroke   . ESRD (end stage renal disease) on dialysis     Past Surgical History  Procedure Laterality Date  . Cholecystectomy    . Arteriovenous graft placement    . Shoulder surgery    . Colonoscopy  2004    Dr. Irving Shows, left sided diverticula and cecal polyp, path unknown  . Esophagogastroduodenoscopy  11/2002    Dr. Gala Romney, erosive reflux esophagitis, multiple gastric ulcer and antral/bulbar erosions. Serologies positive for H.Pylori and was treated  . Esophagogastroduodenoscopy  11/20014    Dr. Gala Romney, small hh only, ulcers healed  . Esophagogastroduodenoscopy  09/21/2011    Dr Trevor Iha [facility], antral erosions, ?early GAVE  . Colonoscopy  10/29/2011    Procedure: COLONOSCOPY;  Surgeon: Daneil Dolin, MD;  Location: [facility];  Service: Endoscopy;  Laterality: N/A;  10:15    Family History  Problem Relation Age of Onset   . Colon cancer Neg Hx   . Liver disease Neg Hx     History  Substance Use Topics  . Smoking status: Former Smoker    Quit date: 04/29/2004  . Smokeless tobacco: Former Systems developer    Types: [place] date: 01/16/1987     Comment: quit 2006  . Alcohol Use: No      Review of Systems ROS: Statement: All systems negative except as marked or noted in the HPI; Constitutional: Negative for fever and chills. ; ; Eyes: Negative for eye pain, redness and discharge. ; ; ENMT: Negative for ear pain, hoarseness, nasal congestion, sinus pressure and sore throat. ; ; Cardiovascular: Negative for chest pain, palpitations, diaphoresis, dyspnea and peripheral edema. ; ; Respiratory: Negative for cough, wheezing and stridor. ; ; Gastrointestinal: +N/V. Negative for diarrhea, abdominal pain, blood in stool, hematemesis, jaundice and rectal bleeding. . ; ; Genitourinary: Negative for dysuria, flank pain and hematuria. ; ; Musculoskeletal: Negative for back pain and neck pain. Negative for swelling and trauma.; ; Skin: Negative for pruritus, rash, abrasions, blisters, bruising and skin lesion.; ; Neuro: +lightheadedness. Negative for headache and neck stiffness. Negative for weakness, altered level of consciousness , altered mental status, extremity weakness, paresthesias, involuntary movement, seizure and syncope.        Allergies  Aspirin  Home Medications   Current Outpatient Rx  Name  Route  Sig  Dispense  Refill  . ALPRAZolam (XANAX) 0.25 MG tablet   Oral   Take 0.25 mg by mouth 3 (three) times daily.         Marland Kitchen amLODipine (NORVASC) 5 MG tablet   Oral   Take 5 mg by mouth daily.         Marland Kitchen b complex-vitamin c-folic acid (NEPHRO-VITE) 0.8 MG TABS   Oral   Take 0.8 mg by mouth as directed. Takes on Tuesdays, Thursdays, Saturdays, and Sundays. Does not take on Mondays, Wednesdays, and Fridays due to Dialysis treatments.         . cinacalcet (SENSIPAR) 60 MG tablet   Oral   Take 60 mg by  mouth every evening. With evening meal         . HYDROcodone-acetaminophen (NORCO/VICODIN) 5-325 MG per tablet   Oral   Take 1 tablet by mouth every 4 (four) hours as needed for pain.   15 tablet   0   . hydrOXYzine (ATARAX/VISTARIL) 10 MG tablet   Oral   Take 10 mg by mouth as needed. For itching         . metoprolol succinate (TOPROL-XL) 50 MG 24 hr tablet   Oral   Take 50 mg by mouth daily.          . penicillin v potassium (VEETID) 500 MG tablet   Oral   Take 1 tablet (500 mg total) by mouth 4 (four) times daily.   40 tablet   0   . promethazine (PHENERGAN) 25 MG tablet   Oral   Take 12.5 mg by mouth Daily.         . sevelamer (RENVELA) 800 MG tablet   Oral   Take 2,400-4,000 mg by mouth 3 (three) times daily with meals. Patient takes 5 tablets with meals and 3 tablets with snacks           BP 129/80  Pulse 97  Temp(Src) 98.3 F (36.8 C) (Oral)  Resp 20  Ht 5\' 5"  (1.651 m)  Wt 184 lb (83.462 kg)  BMI 30.62 kg/m2  SpO2 98%  Physical Exam 2040: Physical examination: Vital signs and O2 SAT: Reviewed; Constitutional: Well developed, Well nourished, In no acute distress; Head and Face: Normocephalic, Atraumatic; Eyes: EOMI, PERRL, No scleral icterus; ENMT: Mouth and pharynx normal, Poor dentition, Widespread dental decay, Left TM normal, Right TM normal, Mucous membranes dry, +upper left canine and premolars with extensive dental decay. Multiple missing teeth.  No gingival erythema, edema, fluctuance, or drainage.  No intra-oral edema. No submandibular or sublingual edema. No hoarse voice, no drooling, no stridor.  ; Neck: Supple, Full range of motion, No lymphadenopathy; Cardiovascular: Regular rate and rhythm, No gallop; Respiratory: Breath sounds clear & equal bilaterally, No rales, rhonchi, wheezes, Normal respiratory effort/excursion; Chest: Nontender, Movement normal; Extremities: Pulses normal, +palp thrill RUE AV fistula. No tenderness, No edema; Neuro:  AA&Ox3, Major CN grossly intact.  Strength 5/5 equal bilat UE's and LE's.  DTR 2/4 equal bilat UE's and LE's.  No gross sensory deficits.  Normal cerebellar testing bilat UE's (finger-nose) and LE's (heel-shin). Speech clear.  No facial droop.  No nystagmus.;;; Skin: Color normal, No rash, No petechiae, Warm, Dry    ED Course  Procedures    MDM  MDM Reviewed: previous chart, nursing note and vitals Reviewed previous: labs and ECG Interpretation: labs, ECG, x-ray and CT scan  Date: 07/13/2012  Rate: 92  Rhythm: normal sinus rhythm  QRS Axis: normal  Intervals: QT prolonged, QTc 502  ST/T Wave abnormalities: nonspecific T wave changes, TWI lead aVL  Conduction Disutrbances:nonspecific intraventricular conduction delay  Narrative Interpretation:   Old EKG Reviewed: unchanged and changes noted; QTc prolonged compared to previous EKG dated 01/24/2012, but EKG otherwise without significant changes.   Results for orders placed during the hospital encounter of 07/13/12  CBC WITH DIFFERENTIAL      Result Value Range   WBC 12.6 (*) 4.0 - 10.5 K/uL   RBC 7.18 (*) 4.22 - 5.81 MIL/uL   Hemoglobin 15.6  13.0 - 17.0 g/dL   HCT 47.0  39.0 - 52.0 %   MCV 65.5 (*) 78.0 - 100.0 fL   MCH 21.7 (*) 26.0 - 34.0 pg   MCHC 33.2  30.0 - 36.0 g/dL   RDW 19.1 (*) 11.5 - 15.5 %   Platelets 296  150 - 400 K/uL   Neutrophils Relative 83 (*) 43 - 77 %   Lymphocytes Relative 9 (*) 12 - 46 %   Monocytes Relative 6  3 - 12 %   Eosinophils Relative 2  0 - 5 %   Basophils Relative 0  0 - 1 %   Neutro Abs 10.4 (*) 1.7 - 7.7 K/uL   Lymphs Abs 1.1  0.7 - 4.0 K/uL   Monocytes Absolute 0.8  0.1 - 1.0 K/uL   Eosinophils Absolute 0.3  0.0 - 0.7 K/uL   Basophils Absolute 0.0  0.0 - 0.1 K/uL   RBC Morphology TARGET CELLS     Smear Review       Value: PLATELET CLUMPS NOTED ON SMEAR, COUNT APPEARS ADEQUATE   Dg Chest 2 View 07/13/2012  *RADIOLOGY REPORT*  Clinical Data: Nausea, vomiting, and dizziness tonight.   The patient had dialysis today.  CHEST - 2 VIEW  Comparison: 07/11/2011  Findings: Shallow inspiration. The heart size and pulmonary vascularity are normal. The lungs appear clear and expanded without focal air space disease or consolidation. No blunting of the costophrenic angles.  No pneumothorax.  Mediastinal contours appear intact.  Calcified and tortuous aorta.  Probable calcified granulomas in the right hilum.  Degenerative changes in the spine. No significant change since previous study.  IMPRESSION: Shallow inspiration.  No evidence of active pulmonary disease.   Original Report Authenticated By: Lucienne Capers, M.D.    Ct Head Wo Contrast 07/13/2012  *RADIOLOGY REPORT*  Clinical Data: Acute onset dizziness and nausea/vomiting 1 hour ago.  Generalized weakness.  End-stage renal disease on hemodialysis, and the patient had dialysis earlier today.  CT HEAD WITHOUT CONTRAST  Technique:  Contiguous axial images were obtained from the base of the skull through the vertex without contrast.  Comparison: None.  Findings: Ventricular system normal in size and appearance for age. Mild cortical atrophy consistent with age.  Mild changes of small vessel disease of the white matter.  Physiologic calcifications in the basal ganglia.  No mass lesion.  No midline shift.  No acute hemorrhage or hematoma.  No extra-axial fluid collections.  No evidence of acute infarction.  No skull fracture or other focal osseous abnormality involving the skull.  Visualized paranasal sinuses, bilateral mastoid air cells, and bilateral middle ear cavities well-aerated.  Bilateral carotid siphon and vertebrobasilar atherosclerosis.  IMPRESSION:  1.  No acute intracranial abnormality. 2.  Mild chronic microvascular ischemic changes of the white matter.   Original Report Authenticated By: Evangeline Dakin, M.D.  2205:  Pt not orthostatic.  Labs pending. Sign out to Dr. Rogene Houston.              Alfonzo Feller, DO 07/13/12  2208

## 2012-07-15 DIAGNOSIS — N2581 Secondary hyperparathyroidism of renal origin: Secondary | ICD-10-CM | POA: Diagnosis not present

## 2012-07-15 DIAGNOSIS — N186 End stage renal disease: Secondary | ICD-10-CM | POA: Diagnosis not present

## 2012-07-15 DIAGNOSIS — D509 Iron deficiency anemia, unspecified: Secondary | ICD-10-CM | POA: Diagnosis not present

## 2012-07-15 DIAGNOSIS — R112 Nausea with vomiting, unspecified: Secondary | ICD-10-CM | POA: Diagnosis not present

## 2012-07-16 DIAGNOSIS — R310 Gross hematuria: Secondary | ICD-10-CM | POA: Diagnosis not present

## 2012-07-16 DIAGNOSIS — N189 Chronic kidney disease, unspecified: Secondary | ICD-10-CM | POA: Diagnosis not present

## 2012-07-16 DIAGNOSIS — N289 Disorder of kidney and ureter, unspecified: Secondary | ICD-10-CM | POA: Diagnosis not present

## 2012-07-17 DIAGNOSIS — D509 Iron deficiency anemia, unspecified: Secondary | ICD-10-CM | POA: Diagnosis not present

## 2012-07-17 DIAGNOSIS — N2581 Secondary hyperparathyroidism of renal origin: Secondary | ICD-10-CM | POA: Diagnosis not present

## 2012-07-17 DIAGNOSIS — R112 Nausea with vomiting, unspecified: Secondary | ICD-10-CM | POA: Diagnosis not present

## 2012-07-17 DIAGNOSIS — N186 End stage renal disease: Secondary | ICD-10-CM | POA: Diagnosis not present

## 2012-07-20 DIAGNOSIS — I10 Essential (primary) hypertension: Secondary | ICD-10-CM | POA: Diagnosis not present

## 2012-07-20 DIAGNOSIS — D509 Iron deficiency anemia, unspecified: Secondary | ICD-10-CM | POA: Diagnosis not present

## 2012-07-20 DIAGNOSIS — N19 Unspecified kidney failure: Secondary | ICD-10-CM | POA: Diagnosis not present

## 2012-07-20 DIAGNOSIS — R112 Nausea with vomiting, unspecified: Secondary | ICD-10-CM | POA: Diagnosis not present

## 2012-07-20 DIAGNOSIS — N2581 Secondary hyperparathyroidism of renal origin: Secondary | ICD-10-CM | POA: Diagnosis not present

## 2012-07-20 DIAGNOSIS — N186 End stage renal disease: Secondary | ICD-10-CM | POA: Diagnosis not present

## 2012-07-20 DIAGNOSIS — J449 Chronic obstructive pulmonary disease, unspecified: Secondary | ICD-10-CM | POA: Diagnosis not present

## 2012-07-21 ENCOUNTER — Other Ambulatory Visit (HOSPITAL_COMMUNITY): Payer: Self-pay | Admitting: Urology

## 2012-07-21 ENCOUNTER — Other Ambulatory Visit: Payer: Self-pay | Admitting: Radiation Oncology

## 2012-07-21 DIAGNOSIS — N281 Cyst of kidney, acquired: Secondary | ICD-10-CM

## 2012-07-22 DIAGNOSIS — R112 Nausea with vomiting, unspecified: Secondary | ICD-10-CM | POA: Diagnosis not present

## 2012-07-22 DIAGNOSIS — N186 End stage renal disease: Secondary | ICD-10-CM | POA: Diagnosis not present

## 2012-07-22 DIAGNOSIS — N2581 Secondary hyperparathyroidism of renal origin: Secondary | ICD-10-CM | POA: Diagnosis not present

## 2012-07-22 DIAGNOSIS — D509 Iron deficiency anemia, unspecified: Secondary | ICD-10-CM | POA: Diagnosis not present

## 2012-07-23 ENCOUNTER — Ambulatory Visit: Payer: Medicare Other | Admitting: Vascular Surgery

## 2012-07-24 DIAGNOSIS — N2581 Secondary hyperparathyroidism of renal origin: Secondary | ICD-10-CM | POA: Diagnosis not present

## 2012-07-24 DIAGNOSIS — R112 Nausea with vomiting, unspecified: Secondary | ICD-10-CM | POA: Diagnosis not present

## 2012-07-24 DIAGNOSIS — N186 End stage renal disease: Secondary | ICD-10-CM | POA: Diagnosis not present

## 2012-07-24 DIAGNOSIS — D509 Iron deficiency anemia, unspecified: Secondary | ICD-10-CM | POA: Diagnosis not present

## 2012-07-27 DIAGNOSIS — D509 Iron deficiency anemia, unspecified: Secondary | ICD-10-CM | POA: Diagnosis not present

## 2012-07-27 DIAGNOSIS — N186 End stage renal disease: Secondary | ICD-10-CM | POA: Diagnosis not present

## 2012-07-27 DIAGNOSIS — R112 Nausea with vomiting, unspecified: Secondary | ICD-10-CM | POA: Diagnosis not present

## 2012-07-27 DIAGNOSIS — N2581 Secondary hyperparathyroidism of renal origin: Secondary | ICD-10-CM | POA: Diagnosis not present

## 2012-07-28 ENCOUNTER — Other Ambulatory Visit (HOSPITAL_COMMUNITY): Payer: Self-pay | Admitting: Urology

## 2012-07-28 ENCOUNTER — Ambulatory Visit (HOSPITAL_COMMUNITY)
Admission: RE | Admit: 2012-07-28 | Discharge: 2012-07-28 | Disposition: A | Discharge status: Home / Self Care | Payer: Medicare Other | Source: Ambulatory Visit | Attending: Urology | Admitting: Urology

## 2012-07-28 DIAGNOSIS — Q618 Other cystic kidney diseases: Secondary | ICD-10-CM | POA: Insufficient documentation

## 2012-07-28 DIAGNOSIS — N281 Cyst of kidney, acquired: Secondary | ICD-10-CM

## 2012-07-28 DIAGNOSIS — E278 Other specified disorders of adrenal gland: Secondary | ICD-10-CM | POA: Diagnosis not present

## 2012-07-28 DIAGNOSIS — R310 Gross hematuria: Secondary | ICD-10-CM | POA: Diagnosis not present

## 2012-07-29 DIAGNOSIS — N2581 Secondary hyperparathyroidism of renal origin: Secondary | ICD-10-CM | POA: Diagnosis not present

## 2012-07-29 DIAGNOSIS — D509 Iron deficiency anemia, unspecified: Secondary | ICD-10-CM | POA: Diagnosis not present

## 2012-07-29 DIAGNOSIS — N186 End stage renal disease: Secondary | ICD-10-CM | POA: Diagnosis not present

## 2012-07-31 DIAGNOSIS — N2581 Secondary hyperparathyroidism of renal origin: Secondary | ICD-10-CM | POA: Diagnosis not present

## 2012-07-31 DIAGNOSIS — N186 End stage renal disease: Secondary | ICD-10-CM | POA: Diagnosis not present

## 2012-07-31 DIAGNOSIS — D509 Iron deficiency anemia, unspecified: Secondary | ICD-10-CM | POA: Diagnosis not present

## 2012-08-03 DIAGNOSIS — D509 Iron deficiency anemia, unspecified: Secondary | ICD-10-CM | POA: Diagnosis not present

## 2012-08-03 DIAGNOSIS — N186 End stage renal disease: Secondary | ICD-10-CM | POA: Diagnosis not present

## 2012-08-03 DIAGNOSIS — N2581 Secondary hyperparathyroidism of renal origin: Secondary | ICD-10-CM | POA: Diagnosis not present

## 2012-08-05 DIAGNOSIS — D509 Iron deficiency anemia, unspecified: Secondary | ICD-10-CM | POA: Diagnosis not present

## 2012-08-05 DIAGNOSIS — N2581 Secondary hyperparathyroidism of renal origin: Secondary | ICD-10-CM | POA: Diagnosis not present

## 2012-08-05 DIAGNOSIS — N186 End stage renal disease: Secondary | ICD-10-CM | POA: Diagnosis not present

## 2012-08-07 DIAGNOSIS — N2581 Secondary hyperparathyroidism of renal origin: Secondary | ICD-10-CM | POA: Diagnosis not present

## 2012-08-07 DIAGNOSIS — D509 Iron deficiency anemia, unspecified: Secondary | ICD-10-CM | POA: Diagnosis not present

## 2012-08-07 DIAGNOSIS — N186 End stage renal disease: Secondary | ICD-10-CM | POA: Diagnosis not present

## 2012-08-10 DIAGNOSIS — D509 Iron deficiency anemia, unspecified: Secondary | ICD-10-CM | POA: Diagnosis not present

## 2012-08-10 DIAGNOSIS — N186 End stage renal disease: Secondary | ICD-10-CM | POA: Diagnosis not present

## 2012-08-10 DIAGNOSIS — N2581 Secondary hyperparathyroidism of renal origin: Secondary | ICD-10-CM | POA: Diagnosis not present

## 2012-08-12 DIAGNOSIS — N2581 Secondary hyperparathyroidism of renal origin: Secondary | ICD-10-CM | POA: Diagnosis not present

## 2012-08-12 DIAGNOSIS — D509 Iron deficiency anemia, unspecified: Secondary | ICD-10-CM | POA: Diagnosis not present

## 2012-08-12 DIAGNOSIS — N186 End stage renal disease: Secondary | ICD-10-CM | POA: Diagnosis not present

## 2012-08-14 DIAGNOSIS — N2581 Secondary hyperparathyroidism of renal origin: Secondary | ICD-10-CM | POA: Diagnosis not present

## 2012-08-14 DIAGNOSIS — D509 Iron deficiency anemia, unspecified: Secondary | ICD-10-CM | POA: Diagnosis not present

## 2012-08-14 DIAGNOSIS — N186 End stage renal disease: Secondary | ICD-10-CM | POA: Diagnosis not present

## 2012-08-17 ENCOUNTER — Other Ambulatory Visit: Payer: Self-pay

## 2012-08-17 ENCOUNTER — Emergency Department (HOSPITAL_COMMUNITY): Payer: Medicare Other

## 2012-08-17 ENCOUNTER — Emergency Department (HOSPITAL_COMMUNITY)
Admission: EM | Admit: 2012-08-17 | Discharge: 2012-08-17 | Disposition: A | Discharge status: Home / Self Care | Payer: Medicare Other | Attending: Emergency Medicine | Admitting: Emergency Medicine

## 2012-08-17 ENCOUNTER — Encounter (HOSPITAL_COMMUNITY): Payer: Self-pay | Admitting: *Deleted

## 2012-08-17 DIAGNOSIS — Z8719 Personal history of other diseases of the digestive system: Secondary | ICD-10-CM | POA: Diagnosis not present

## 2012-08-17 DIAGNOSIS — Z8673 Personal history of transient ischemic attack (TIA), and cerebral infarction without residual deficits: Secondary | ICD-10-CM | POA: Diagnosis not present

## 2012-08-17 DIAGNOSIS — R5383 Other fatigue: Secondary | ICD-10-CM | POA: Diagnosis not present

## 2012-08-17 DIAGNOSIS — Z8679 Personal history of other diseases of the circulatory system: Secondary | ICD-10-CM | POA: Diagnosis not present

## 2012-08-17 DIAGNOSIS — Z79899 Other long term (current) drug therapy: Secondary | ICD-10-CM | POA: Diagnosis not present

## 2012-08-17 DIAGNOSIS — R509 Fever, unspecified: Secondary | ICD-10-CM | POA: Insufficient documentation

## 2012-08-17 DIAGNOSIS — R61 Generalized hyperhidrosis: Secondary | ICD-10-CM | POA: Insufficient documentation

## 2012-08-17 DIAGNOSIS — D509 Iron deficiency anemia, unspecified: Secondary | ICD-10-CM | POA: Diagnosis not present

## 2012-08-17 DIAGNOSIS — N186 End stage renal disease: Secondary | ICD-10-CM | POA: Insufficient documentation

## 2012-08-17 DIAGNOSIS — R5381 Other malaise: Secondary | ICD-10-CM | POA: Insufficient documentation

## 2012-08-17 DIAGNOSIS — R531 Weakness: Secondary | ICD-10-CM

## 2012-08-17 DIAGNOSIS — Z992 Dependence on renal dialysis: Secondary | ICD-10-CM | POA: Diagnosis not present

## 2012-08-17 DIAGNOSIS — I120 Hypertensive chronic kidney disease with stage 5 chronic kidney disease or end stage renal disease: Secondary | ICD-10-CM | POA: Diagnosis not present

## 2012-08-17 DIAGNOSIS — R112 Nausea with vomiting, unspecified: Secondary | ICD-10-CM | POA: Insufficient documentation

## 2012-08-17 DIAGNOSIS — N2581 Secondary hyperparathyroidism of renal origin: Secondary | ICD-10-CM | POA: Diagnosis not present

## 2012-08-17 DIAGNOSIS — Z87891 Personal history of nicotine dependence: Secondary | ICD-10-CM | POA: Insufficient documentation

## 2012-08-17 LAB — CBC WITH DIFFERENTIAL/PLATELET
Basophils Absolute: 0 10*3/uL (ref 0.0–0.1)
Basophils Relative: 0 % (ref 0–1)
Eosinophils Absolute: 0.3 10*3/uL (ref 0.0–0.7)
HCT: 45.8 % (ref 39.0–52.0)
Hemoglobin: 15.5 g/dL (ref 13.0–17.0)
Lymphs Abs: 2.1 10*3/uL (ref 0.7–4.0)
MCHC: 33.8 g/dL (ref 30.0–36.0)
MCV: 65.1 fL — ABNORMAL LOW (ref 78.0–100.0)
Monocytes Relative: 6 % (ref 3–12)
Neutro Abs: 7.8 10*3/uL — ABNORMAL HIGH (ref 1.7–7.7)
RDW: 19.1 % — ABNORMAL HIGH (ref 11.5–15.5)

## 2012-08-17 LAB — BASIC METABOLIC PANEL
BUN: 29 mg/dL — ABNORMAL HIGH (ref 6–23)
CO2: 25 mEq/L (ref 19–32)
GFR calc non Af Amer: 6 mL/min — ABNORMAL LOW (ref >90)
Glucose, Bld: 121 mg/dL — ABNORMAL HIGH (ref 70–99)
Potassium: 3.5 mEq/L (ref 3.5–5.1)

## 2012-08-17 MED ORDER — ONDANSETRON 8 MG PO TBDP
8.0000 mg | ORAL_TABLET | Freq: Once | ORAL | Status: AC
Start: 2012-08-17 — End: 2012-08-17

## 2012-08-17 MED ORDER — ONDANSETRON 8 MG PO TBDP
ORAL_TABLET | ORAL | Status: AC
  Administered 2012-08-17: 8 mg via ORAL
  Filled 2012-08-17: qty 1

## 2012-08-17 NOTE — ED Provider Notes (Signed)
History  This chart was scribed for Joseph Diego, MD by Frederich Balding, ED Scribe. This patient was seen in room APA02/APA02 and the patient's care was started at 4:21 PM.   CSN: [id number]  Arrival date & time 08/17/12  1542     Chief Complaint  Patient presents with  . Dizziness  . Emesis    Patient is a 72 y.o. male presenting with vomiting. The history is provided by the patient. No language interpreter was used.  Emesis Severity:  Moderate Duration:  2 hours Timing:  Intermittent Quality:  Stomach contents Progression:  Worsening Chronicity:  New Relieved by:  None tried Worsened by:  Nothing tried Ineffective treatments:  None tried Associated symptoms: fever   Associated symptoms: no abdominal pain, no diarrhea and no headaches     [name] is a 72 y.o. male who presents to the Emergency Department complaining of moderate, intermittent emesis with associated episodes of dizziness that started approximately 2 hours after dialysis today. Pt states he has the diaphoresis and a slight fever. Pt denies neck pain, sore throat, visual disturbance, CP, cough, SOB, abdominal pain, diarrhea, urinary symptoms, back pain, HA, weakness, numbness and rash as associated symptoms.     Past Medical History  Diagnosis Date  . Hypertension   . CVA (cerebral infarction)   . Gastric ulcer 2004    treated for h.pylori see PSH  . LVH (left ventricular hypertrophy)   . Stroke   . ESRD (end stage renal disease) on dialysis     Past Surgical History  Procedure Laterality Date  . Cholecystectomy    . Arteriovenous graft placement    . Shoulder surgery    . Colonoscopy  2004    Dr. Irving Shows, left sided diverticula and cecal polyp, path unknown  . Esophagogastroduodenoscopy  11/2002    Dr. Gala Romney, erosive reflux esophagitis, multiple gastric ulcer and antral/bulbar erosions. Serologies positive for H.Pylori and was treated  . Esophagogastroduodenoscopy  11/20014    Dr. Gala Romney,  small hh only, ulcers healed  . Esophagogastroduodenoscopy  09/21/2011    Dr Trevor Iha [facility], antral erosions, ?early GAVE  . Colonoscopy  10/29/2011    Procedure: COLONOSCOPY;  Surgeon: Daneil Dolin, MD;  Location: [facility];  Service: Endoscopy;  Laterality: N/A;  10:15    Family History  Problem Relation Age of Onset  . Colon cancer Neg Hx   . Liver disease Neg Hx     History  Substance Use Topics  . Smoking status: Former Smoker    Quit date: 04/29/2004  . Smokeless tobacco: Former Systems developer    Types: [place] date: 01/16/1987     Comment: quit 2006  . Alcohol Use: No      Review of Systems  Constitutional: Positive for fever. Negative for appetite change and fatigue.  HENT: Negative for congestion, sinus pressure and ear discharge.   Eyes: Negative for discharge.  Respiratory: Negative for cough.   Cardiovascular: Negative for chest pain.  Gastrointestinal: Positive for nausea and vomiting. Negative for abdominal pain and diarrhea.  Genitourinary: Negative for frequency and hematuria.  Musculoskeletal: Negative for back pain.  Skin: Negative for rash.  Neurological: Positive for dizziness. Negative for seizures and headaches.  Psychiatric/Behavioral: Negative for hallucinations.    Allergies  Aspirin  Home Medications   Current Outpatient Rx  Name  Route  Sig  Dispense  Refill  . ALPRAZolam (XANAX) 0.25 MG tablet   Oral   Take 0.25  mg by mouth 3 (three) times daily.         Marland Kitchen amLODipine (NORVASC) 5 MG tablet   Oral   Take 5 mg by mouth daily.         Marland Kitchen b complex-vitamin c-folic acid (NEPHRO-VITE) 0.8 MG TABS   Oral   Take 0.8 mg by mouth as directed. Takes on Tuesdays, Thursdays, Saturdays, and Sundays. Does not take on Mondays, Wednesdays, and Fridays due to Dialysis treatments.         . cinacalcet (SENSIPAR) 60 MG tablet   Oral   Take 60 mg by mouth every evening. With evening meal         . HYDROcodone-acetaminophen (NORCO/VICODIN)  5-325 MG per tablet   Oral   Take 1 tablet by mouth every 4 (four) hours as needed for pain.   15 tablet   0   . hydrOXYzine (ATARAX/VISTARIL) 10 MG tablet   Oral   Take 10 mg by mouth as needed. For itching         . metoprolol succinate (TOPROL-XL) 50 MG 24 hr tablet   Oral   Take 50 mg by mouth daily.          . promethazine (PHENERGAN) 25 MG tablet   Oral   Take 12.5 mg by mouth Daily.         . sevelamer (RENVELA) 800 MG tablet   Oral   Take 2,400-4,000 mg by mouth 3 (three) times daily with meals. Patient takes 5 tablets with meals and 3 tablets with snacks           Triage Vitals: BP 155/92  Pulse 107  Temp(Src) 98.2 F (36.8 C) (Oral)  Resp 20  Wt 184 lb (83.462 kg)  BMI 30.62 kg/m2  SpO2 100%  Physical Exam  Nursing note and vitals reviewed. Constitutional: He is oriented to person, place, and time. He appears well-developed.  HENT:  Head: Normocephalic.  Dry mucous membranes.  Eyes: Conjunctivae and EOM are normal. No scleral icterus.  Neck: Neck supple. No thyromegaly present.  Cardiovascular: Normal rate and regular rhythm.  Exam reveals no gallop and no friction rub.   Murmur heard.  Systolic murmur is present with a grade of 2/6  Pulmonary/Chest: No stridor. He has no wheezes. He has no rales. He exhibits no tenderness.  Abdominal: He exhibits no distension. There is no tenderness. There is no rebound.  Musculoskeletal: Normal range of motion. He exhibits no edema.  Lymphadenopathy:    He has no cervical adenopathy.  Neurological: He is oriented to person, place, and time. Coordination normal.  Skin: No rash noted. No erythema.  Psychiatric: He has a normal mood and affect. His behavior is normal.    ED Course  Procedures (including critical care time)  DIAGNOSTIC STUDIES: Oxygen Saturation is 100% on RA, normal by my interpretation.    COORDINATION OF CARE: 4:35 PM-Discussed treatment plan which includes chest DG, CBC, BMP, and  troponin with pt at bedside and pt agreed to plan.  Medications  ondansetron (ZOFRAN-ODT) disintegrating tablet 8 mg (8 mg Oral Given 08/17/12 1619)   6:00 PM- Discussed test results with pt that showed no abnormalities. Advised pt of discharge with medications for nausea.     Labs Reviewed  CBC WITH DIFFERENTIAL - Abnormal; Notable for the following:    WBC 10.9 (*)    RBC 7.04 (*)    MCV 65.1 (*)    MCH 22.0 (*)    RDW 19.1 (*)  All other components within normal limits  BASIC METABOLIC PANEL  TROPONIN I   Dg Chest Port 1 View  08/17/2012  *RADIOLOGY REPORT*  Clinical Data: Weakness.  PORTABLE CHEST - 1 VIEW  Comparison: 07/13/2012  Findings: Low lung volumes again noted.  There is tortuosity of the thoracic aorta.  Heart size within normal limits for technique. The lungs appear grossly clear.  IMPRESSION:  1.  Low lung volumes.  No acute findings.   Original Report Authenticated By: Van Clines, M.D.      No diagnosis found.   Date: 08/17/2012  Rate:98  Rhythm: normal sinus rhythm  QRS Axis: normal  Intervals: normal  ST/T Wave abnormalities: nonspecific ST changes  Conduction Disutrbances:none  Narrative Interpretation:   Old EKG Reviewed: unchanged    MDM        The chart was scribed for me under my direct supervision.  I personally performed the history, physical, and medical decision making and all procedures in the evaluation of this patient.Joseph Diego, MD 08/17/12 [phone]

## 2012-08-17 NOTE — ED Notes (Addendum)
Increased dizziness with nausea that started approx 1 hr PTA.  Denies pain.  Denies weakness.  Speech clear. Neg FAST exam in triage.  Pt actively vomiting in triage.

## 2012-08-19 ENCOUNTER — Encounter: Payer: Self-pay | Admitting: Vascular Surgery

## 2012-08-19 DIAGNOSIS — D509 Iron deficiency anemia, unspecified: Secondary | ICD-10-CM | POA: Diagnosis not present

## 2012-08-19 DIAGNOSIS — N2581 Secondary hyperparathyroidism of renal origin: Secondary | ICD-10-CM | POA: Diagnosis not present

## 2012-08-19 DIAGNOSIS — N186 End stage renal disease: Secondary | ICD-10-CM | POA: Diagnosis not present

## 2012-08-20 ENCOUNTER — Encounter: Payer: Self-pay | Admitting: Vascular Surgery

## 2012-08-20 ENCOUNTER — Ambulatory Visit (INDEPENDENT_AMBULATORY_CARE_PROVIDER_SITE_OTHER): Payer: Medicare Other | Admitting: Vascular Surgery

## 2012-08-20 ENCOUNTER — Encounter (INDEPENDENT_AMBULATORY_CARE_PROVIDER_SITE_OTHER): Payer: Medicare Other | Admitting: *Deleted

## 2012-08-20 VITALS — BP 118/74 | Temp 97.6°F | Resp 18 | Ht 65.0 in | Wt 186.0 lb

## 2012-08-20 DIAGNOSIS — I739 Peripheral vascular disease, unspecified: Secondary | ICD-10-CM

## 2012-08-20 NOTE — Addendum Note (Signed)
Addended by: Dorthula Rue L on: 08/20/2012 03:43 PM   Modules accepted: Orders

## 2012-08-20 NOTE — Progress Notes (Signed)
Patient is a 72 year old male with peripheral arterial disease who returns for followup today. He states that the ulcers on his right foot has now completely healed. He denies any claudication symptoms. He is also on chronic hemodialysis. He states his right arm fistula is working well. He does still have a callus over the first metatarsal head on both feet.  Review of systems: He denies shortness of breath. He denies chest pain.  Physical exam: Filed Vitals:   08/20/12 1259  BP: 118/74  Temp: 97.6 F (36.4 C)  TempSrc: Oral  Resp: 18  Height: 5\' 5"  (1.651 m)  Weight: 186 lb (84.369 kg)  SpO2: 99%    Lower extremities: Skin intact feet bilaterally thickened callus 3 cm diameter of her first metatarsal head bilaterally no open ulcer, nonpalpable pedal pulses  Data: The patient had bilateral lower extremity duplex exam today which I reviewed and interpreted. This is consistent with his recent arteriogram which showed in-line flow via the anterior tibial artery bilaterally. The anterior tibial artery and all vessels above this are continuing to be patent. There is some stenosis of the mid right superficial femoral artery.  Assessment: Healed wounds known peripheral arterial disease primarily tibial vessels. The patient will continue followup intermittently at the wound center to see if they can soften the calluses on his feet. He will followup in one year with need for repeat arterial duplex exam. If he has no wounds that time and overall is unchanged he may followup on as-needed basis.  [name], MD [facility] Office: [phone] Pager: [phone]

## 2012-08-21 DIAGNOSIS — N2581 Secondary hyperparathyroidism of renal origin: Secondary | ICD-10-CM | POA: Diagnosis not present

## 2012-08-21 DIAGNOSIS — D509 Iron deficiency anemia, unspecified: Secondary | ICD-10-CM | POA: Diagnosis not present

## 2012-08-21 DIAGNOSIS — N186 End stage renal disease: Secondary | ICD-10-CM | POA: Diagnosis not present

## 2012-08-24 DIAGNOSIS — N186 End stage renal disease: Secondary | ICD-10-CM | POA: Diagnosis not present

## 2012-08-24 DIAGNOSIS — D509 Iron deficiency anemia, unspecified: Secondary | ICD-10-CM | POA: Diagnosis not present

## 2012-08-24 DIAGNOSIS — N2581 Secondary hyperparathyroidism of renal origin: Secondary | ICD-10-CM | POA: Diagnosis not present

## 2012-08-26 DIAGNOSIS — N2581 Secondary hyperparathyroidism of renal origin: Secondary | ICD-10-CM | POA: Diagnosis not present

## 2012-08-26 DIAGNOSIS — N186 End stage renal disease: Secondary | ICD-10-CM | POA: Diagnosis not present

## 2012-08-26 DIAGNOSIS — D509 Iron deficiency anemia, unspecified: Secondary | ICD-10-CM | POA: Diagnosis not present

## 2012-08-28 DIAGNOSIS — N2581 Secondary hyperparathyroidism of renal origin: Secondary | ICD-10-CM | POA: Diagnosis not present

## 2012-08-28 DIAGNOSIS — D509 Iron deficiency anemia, unspecified: Secondary | ICD-10-CM | POA: Diagnosis not present

## 2012-08-28 DIAGNOSIS — N186 End stage renal disease: Secondary | ICD-10-CM | POA: Diagnosis not present

## 2012-08-31 DIAGNOSIS — N2581 Secondary hyperparathyroidism of renal origin: Secondary | ICD-10-CM | POA: Diagnosis not present

## 2012-08-31 DIAGNOSIS — N186 End stage renal disease: Secondary | ICD-10-CM | POA: Diagnosis not present

## 2012-08-31 DIAGNOSIS — D509 Iron deficiency anemia, unspecified: Secondary | ICD-10-CM | POA: Diagnosis not present

## 2012-09-02 DIAGNOSIS — D509 Iron deficiency anemia, unspecified: Secondary | ICD-10-CM | POA: Diagnosis not present

## 2012-09-02 DIAGNOSIS — N186 End stage renal disease: Secondary | ICD-10-CM | POA: Diagnosis not present

## 2012-09-02 DIAGNOSIS — N2581 Secondary hyperparathyroidism of renal origin: Secondary | ICD-10-CM | POA: Diagnosis not present

## 2012-09-03 DIAGNOSIS — I10 Essential (primary) hypertension: Secondary | ICD-10-CM | POA: Diagnosis not present

## 2012-09-03 DIAGNOSIS — N19 Unspecified kidney failure: Secondary | ICD-10-CM | POA: Diagnosis not present

## 2012-09-04 DIAGNOSIS — D509 Iron deficiency anemia, unspecified: Secondary | ICD-10-CM | POA: Diagnosis not present

## 2012-09-04 DIAGNOSIS — N186 End stage renal disease: Secondary | ICD-10-CM | POA: Diagnosis not present

## 2012-09-04 DIAGNOSIS — N2581 Secondary hyperparathyroidism of renal origin: Secondary | ICD-10-CM | POA: Diagnosis not present

## 2012-09-07 DIAGNOSIS — N186 End stage renal disease: Secondary | ICD-10-CM | POA: Diagnosis not present

## 2012-09-07 DIAGNOSIS — D509 Iron deficiency anemia, unspecified: Secondary | ICD-10-CM | POA: Diagnosis not present

## 2012-09-07 DIAGNOSIS — N2581 Secondary hyperparathyroidism of renal origin: Secondary | ICD-10-CM | POA: Diagnosis not present

## 2012-09-09 DIAGNOSIS — N186 End stage renal disease: Secondary | ICD-10-CM | POA: Diagnosis not present

## 2012-09-09 DIAGNOSIS — D509 Iron deficiency anemia, unspecified: Secondary | ICD-10-CM | POA: Diagnosis not present

## 2012-09-09 DIAGNOSIS — N2581 Secondary hyperparathyroidism of renal origin: Secondary | ICD-10-CM | POA: Diagnosis not present

## 2012-09-09 DIAGNOSIS — I10 Essential (primary) hypertension: Secondary | ICD-10-CM | POA: Diagnosis not present

## 2012-09-11 DIAGNOSIS — N186 End stage renal disease: Secondary | ICD-10-CM | POA: Diagnosis not present

## 2012-09-11 DIAGNOSIS — D509 Iron deficiency anemia, unspecified: Secondary | ICD-10-CM | POA: Diagnosis not present

## 2012-09-11 DIAGNOSIS — N2581 Secondary hyperparathyroidism of renal origin: Secondary | ICD-10-CM | POA: Diagnosis not present

## 2012-09-14 DIAGNOSIS — D509 Iron deficiency anemia, unspecified: Secondary | ICD-10-CM | POA: Diagnosis not present

## 2012-09-14 DIAGNOSIS — R310 Gross hematuria: Secondary | ICD-10-CM | POA: Diagnosis not present

## 2012-09-14 DIAGNOSIS — N189 Chronic kidney disease, unspecified: Secondary | ICD-10-CM | POA: Diagnosis not present

## 2012-09-14 DIAGNOSIS — N186 End stage renal disease: Secondary | ICD-10-CM | POA: Diagnosis not present

## 2012-09-14 DIAGNOSIS — N281 Cyst of kidney, acquired: Secondary | ICD-10-CM | POA: Diagnosis not present

## 2012-09-14 DIAGNOSIS — N2581 Secondary hyperparathyroidism of renal origin: Secondary | ICD-10-CM | POA: Diagnosis not present

## 2012-09-16 DIAGNOSIS — N186 End stage renal disease: Secondary | ICD-10-CM | POA: Diagnosis not present

## 2012-09-16 DIAGNOSIS — D509 Iron deficiency anemia, unspecified: Secondary | ICD-10-CM | POA: Diagnosis not present

## 2012-09-16 DIAGNOSIS — N2581 Secondary hyperparathyroidism of renal origin: Secondary | ICD-10-CM | POA: Diagnosis not present

## 2012-09-18 DIAGNOSIS — N186 End stage renal disease: Secondary | ICD-10-CM | POA: Diagnosis not present

## 2012-09-18 DIAGNOSIS — D509 Iron deficiency anemia, unspecified: Secondary | ICD-10-CM | POA: Diagnosis not present

## 2012-09-18 DIAGNOSIS — N2581 Secondary hyperparathyroidism of renal origin: Secondary | ICD-10-CM | POA: Diagnosis not present

## 2012-09-21 DIAGNOSIS — N2581 Secondary hyperparathyroidism of renal origin: Secondary | ICD-10-CM | POA: Diagnosis not present

## 2012-09-21 DIAGNOSIS — N186 End stage renal disease: Secondary | ICD-10-CM | POA: Diagnosis not present

## 2012-09-21 DIAGNOSIS — D509 Iron deficiency anemia, unspecified: Secondary | ICD-10-CM | POA: Diagnosis not present

## 2012-09-23 DIAGNOSIS — N186 End stage renal disease: Secondary | ICD-10-CM | POA: Diagnosis not present

## 2012-09-23 DIAGNOSIS — D509 Iron deficiency anemia, unspecified: Secondary | ICD-10-CM | POA: Diagnosis not present

## 2012-09-23 DIAGNOSIS — N2581 Secondary hyperparathyroidism of renal origin: Secondary | ICD-10-CM | POA: Diagnosis not present

## 2012-09-25 DIAGNOSIS — D509 Iron deficiency anemia, unspecified: Secondary | ICD-10-CM | POA: Diagnosis not present

## 2012-09-25 DIAGNOSIS — N2581 Secondary hyperparathyroidism of renal origin: Secondary | ICD-10-CM | POA: Diagnosis not present

## 2012-09-25 DIAGNOSIS — N186 End stage renal disease: Secondary | ICD-10-CM | POA: Diagnosis not present

## 2012-09-26 DIAGNOSIS — N186 End stage renal disease: Secondary | ICD-10-CM | POA: Diagnosis not present

## 2012-09-28 DIAGNOSIS — N2581 Secondary hyperparathyroidism of renal origin: Secondary | ICD-10-CM | POA: Diagnosis not present

## 2012-09-28 DIAGNOSIS — D509 Iron deficiency anemia, unspecified: Secondary | ICD-10-CM | POA: Diagnosis not present

## 2012-09-28 DIAGNOSIS — N186 End stage renal disease: Secondary | ICD-10-CM | POA: Diagnosis not present

## 2012-09-30 DIAGNOSIS — N186 End stage renal disease: Secondary | ICD-10-CM | POA: Diagnosis not present

## 2012-10-06 DIAGNOSIS — N186 End stage renal disease: Secondary | ICD-10-CM | POA: Diagnosis not present

## 2012-10-06 DIAGNOSIS — I871 Compression of vein: Secondary | ICD-10-CM | POA: Diagnosis not present

## 2012-10-06 DIAGNOSIS — T82898A Other specified complication of vascular prosthetic devices, implants and grafts, initial encounter: Secondary | ICD-10-CM | POA: Diagnosis not present

## 2012-10-26 DIAGNOSIS — N186 End stage renal disease: Secondary | ICD-10-CM | POA: Diagnosis not present

## 2012-10-28 DIAGNOSIS — N186 End stage renal disease: Secondary | ICD-10-CM | POA: Diagnosis not present

## 2012-10-28 DIAGNOSIS — D509 Iron deficiency anemia, unspecified: Secondary | ICD-10-CM | POA: Diagnosis not present

## 2012-10-28 DIAGNOSIS — N2581 Secondary hyperparathyroidism of renal origin: Secondary | ICD-10-CM | POA: Diagnosis not present

## 2012-10-30 DIAGNOSIS — D509 Iron deficiency anemia, unspecified: Secondary | ICD-10-CM | POA: Diagnosis not present

## 2012-10-30 DIAGNOSIS — N2581 Secondary hyperparathyroidism of renal origin: Secondary | ICD-10-CM | POA: Diagnosis not present

## 2012-10-30 DIAGNOSIS — N186 End stage renal disease: Secondary | ICD-10-CM | POA: Diagnosis not present

## 2012-11-02 DIAGNOSIS — N186 End stage renal disease: Secondary | ICD-10-CM | POA: Diagnosis not present

## 2012-11-02 DIAGNOSIS — N2581 Secondary hyperparathyroidism of renal origin: Secondary | ICD-10-CM | POA: Diagnosis not present

## 2012-11-02 DIAGNOSIS — D509 Iron deficiency anemia, unspecified: Secondary | ICD-10-CM | POA: Diagnosis not present

## 2012-11-04 DIAGNOSIS — D509 Iron deficiency anemia, unspecified: Secondary | ICD-10-CM | POA: Diagnosis not present

## 2012-11-04 DIAGNOSIS — N2581 Secondary hyperparathyroidism of renal origin: Secondary | ICD-10-CM | POA: Diagnosis not present

## 2012-11-04 DIAGNOSIS — N186 End stage renal disease: Secondary | ICD-10-CM | POA: Diagnosis not present

## 2012-11-06 DIAGNOSIS — D509 Iron deficiency anemia, unspecified: Secondary | ICD-10-CM | POA: Diagnosis not present

## 2012-11-06 DIAGNOSIS — N186 End stage renal disease: Secondary | ICD-10-CM | POA: Diagnosis not present

## 2012-11-06 DIAGNOSIS — N2581 Secondary hyperparathyroidism of renal origin: Secondary | ICD-10-CM | POA: Diagnosis not present

## 2012-11-09 DIAGNOSIS — N2581 Secondary hyperparathyroidism of renal origin: Secondary | ICD-10-CM | POA: Diagnosis not present

## 2012-11-09 DIAGNOSIS — N186 End stage renal disease: Secondary | ICD-10-CM | POA: Diagnosis not present

## 2012-11-09 DIAGNOSIS — D509 Iron deficiency anemia, unspecified: Secondary | ICD-10-CM | POA: Diagnosis not present

## 2012-11-11 DIAGNOSIS — N186 End stage renal disease: Secondary | ICD-10-CM | POA: Diagnosis not present

## 2012-11-11 DIAGNOSIS — D509 Iron deficiency anemia, unspecified: Secondary | ICD-10-CM | POA: Diagnosis not present

## 2012-11-11 DIAGNOSIS — N2581 Secondary hyperparathyroidism of renal origin: Secondary | ICD-10-CM | POA: Diagnosis not present

## 2012-11-13 DIAGNOSIS — N186 End stage renal disease: Secondary | ICD-10-CM | POA: Diagnosis not present

## 2012-11-13 DIAGNOSIS — D509 Iron deficiency anemia, unspecified: Secondary | ICD-10-CM | POA: Diagnosis not present

## 2012-11-13 DIAGNOSIS — N2581 Secondary hyperparathyroidism of renal origin: Secondary | ICD-10-CM | POA: Diagnosis not present

## 2012-11-16 DIAGNOSIS — D509 Iron deficiency anemia, unspecified: Secondary | ICD-10-CM | POA: Diagnosis not present

## 2012-11-16 DIAGNOSIS — N2581 Secondary hyperparathyroidism of renal origin: Secondary | ICD-10-CM | POA: Diagnosis not present

## 2012-11-16 DIAGNOSIS — N186 End stage renal disease: Secondary | ICD-10-CM | POA: Diagnosis not present

## 2012-11-18 DIAGNOSIS — N186 End stage renal disease: Secondary | ICD-10-CM | POA: Diagnosis not present

## 2012-11-18 DIAGNOSIS — N2581 Secondary hyperparathyroidism of renal origin: Secondary | ICD-10-CM | POA: Diagnosis not present

## 2012-11-18 DIAGNOSIS — D509 Iron deficiency anemia, unspecified: Secondary | ICD-10-CM | POA: Diagnosis not present

## 2012-11-20 DIAGNOSIS — N186 End stage renal disease: Secondary | ICD-10-CM | POA: Diagnosis not present

## 2012-11-20 DIAGNOSIS — D509 Iron deficiency anemia, unspecified: Secondary | ICD-10-CM | POA: Diagnosis not present

## 2012-11-20 DIAGNOSIS — N2581 Secondary hyperparathyroidism of renal origin: Secondary | ICD-10-CM | POA: Diagnosis not present

## 2012-11-23 DIAGNOSIS — D509 Iron deficiency anemia, unspecified: Secondary | ICD-10-CM | POA: Diagnosis not present

## 2012-11-23 DIAGNOSIS — N186 End stage renal disease: Secondary | ICD-10-CM | POA: Diagnosis not present

## 2012-11-23 DIAGNOSIS — N2581 Secondary hyperparathyroidism of renal origin: Secondary | ICD-10-CM | POA: Diagnosis not present

## 2012-11-25 DIAGNOSIS — N186 End stage renal disease: Secondary | ICD-10-CM | POA: Diagnosis not present

## 2012-11-25 DIAGNOSIS — N2581 Secondary hyperparathyroidism of renal origin: Secondary | ICD-10-CM | POA: Diagnosis not present

## 2012-11-25 DIAGNOSIS — D509 Iron deficiency anemia, unspecified: Secondary | ICD-10-CM | POA: Diagnosis not present

## 2012-11-26 DIAGNOSIS — N186 End stage renal disease: Secondary | ICD-10-CM | POA: Diagnosis not present

## 2012-11-27 DIAGNOSIS — D509 Iron deficiency anemia, unspecified: Secondary | ICD-10-CM | POA: Diagnosis not present

## 2012-11-27 DIAGNOSIS — N2581 Secondary hyperparathyroidism of renal origin: Secondary | ICD-10-CM | POA: Diagnosis not present

## 2012-11-27 DIAGNOSIS — N186 End stage renal disease: Secondary | ICD-10-CM | POA: Diagnosis not present

## 2012-11-30 DIAGNOSIS — D509 Iron deficiency anemia, unspecified: Secondary | ICD-10-CM | POA: Diagnosis not present

## 2012-11-30 DIAGNOSIS — N2581 Secondary hyperparathyroidism of renal origin: Secondary | ICD-10-CM | POA: Diagnosis not present

## 2012-11-30 DIAGNOSIS — N186 End stage renal disease: Secondary | ICD-10-CM | POA: Diagnosis not present

## 2012-12-02 DIAGNOSIS — N186 End stage renal disease: Secondary | ICD-10-CM | POA: Diagnosis not present

## 2012-12-02 DIAGNOSIS — N2581 Secondary hyperparathyroidism of renal origin: Secondary | ICD-10-CM | POA: Diagnosis not present

## 2012-12-02 DIAGNOSIS — D509 Iron deficiency anemia, unspecified: Secondary | ICD-10-CM | POA: Diagnosis not present

## 2012-12-04 DIAGNOSIS — N2581 Secondary hyperparathyroidism of renal origin: Secondary | ICD-10-CM | POA: Diagnosis not present

## 2012-12-04 DIAGNOSIS — N186 End stage renal disease: Secondary | ICD-10-CM | POA: Diagnosis not present

## 2012-12-04 DIAGNOSIS — D509 Iron deficiency anemia, unspecified: Secondary | ICD-10-CM | POA: Diagnosis not present

## 2012-12-07 DIAGNOSIS — D509 Iron deficiency anemia, unspecified: Secondary | ICD-10-CM | POA: Diagnosis not present

## 2012-12-07 DIAGNOSIS — N186 End stage renal disease: Secondary | ICD-10-CM | POA: Diagnosis not present

## 2012-12-07 DIAGNOSIS — I10 Essential (primary) hypertension: Secondary | ICD-10-CM | POA: Diagnosis not present

## 2012-12-07 DIAGNOSIS — N19 Unspecified kidney failure: Secondary | ICD-10-CM | POA: Diagnosis not present

## 2012-12-07 DIAGNOSIS — N2581 Secondary hyperparathyroidism of renal origin: Secondary | ICD-10-CM | POA: Diagnosis not present

## 2012-12-09 DIAGNOSIS — D509 Iron deficiency anemia, unspecified: Secondary | ICD-10-CM | POA: Diagnosis not present

## 2012-12-09 DIAGNOSIS — N2581 Secondary hyperparathyroidism of renal origin: Secondary | ICD-10-CM | POA: Diagnosis not present

## 2012-12-09 DIAGNOSIS — N186 End stage renal disease: Secondary | ICD-10-CM | POA: Diagnosis not present

## 2012-12-11 DIAGNOSIS — N2581 Secondary hyperparathyroidism of renal origin: Secondary | ICD-10-CM | POA: Diagnosis not present

## 2012-12-11 DIAGNOSIS — D509 Iron deficiency anemia, unspecified: Secondary | ICD-10-CM | POA: Diagnosis not present

## 2012-12-11 DIAGNOSIS — N186 End stage renal disease: Secondary | ICD-10-CM | POA: Diagnosis not present

## 2012-12-14 DIAGNOSIS — N186 End stage renal disease: Secondary | ICD-10-CM | POA: Diagnosis not present

## 2012-12-14 DIAGNOSIS — D509 Iron deficiency anemia, unspecified: Secondary | ICD-10-CM | POA: Diagnosis not present

## 2012-12-14 DIAGNOSIS — N2581 Secondary hyperparathyroidism of renal origin: Secondary | ICD-10-CM | POA: Diagnosis not present

## 2012-12-16 DIAGNOSIS — D509 Iron deficiency anemia, unspecified: Secondary | ICD-10-CM | POA: Diagnosis not present

## 2012-12-16 DIAGNOSIS — N2581 Secondary hyperparathyroidism of renal origin: Secondary | ICD-10-CM | POA: Diagnosis not present

## 2012-12-16 DIAGNOSIS — N186 End stage renal disease: Secondary | ICD-10-CM | POA: Diagnosis not present

## 2012-12-18 DIAGNOSIS — N186 End stage renal disease: Secondary | ICD-10-CM | POA: Diagnosis not present

## 2012-12-18 DIAGNOSIS — D509 Iron deficiency anemia, unspecified: Secondary | ICD-10-CM | POA: Diagnosis not present

## 2012-12-18 DIAGNOSIS — N2581 Secondary hyperparathyroidism of renal origin: Secondary | ICD-10-CM | POA: Diagnosis not present

## 2012-12-21 DIAGNOSIS — N2581 Secondary hyperparathyroidism of renal origin: Secondary | ICD-10-CM | POA: Diagnosis not present

## 2012-12-21 DIAGNOSIS — N186 End stage renal disease: Secondary | ICD-10-CM | POA: Diagnosis not present

## 2012-12-21 DIAGNOSIS — D509 Iron deficiency anemia, unspecified: Secondary | ICD-10-CM | POA: Diagnosis not present

## 2012-12-23 DIAGNOSIS — D509 Iron deficiency anemia, unspecified: Secondary | ICD-10-CM | POA: Diagnosis not present

## 2012-12-23 DIAGNOSIS — N2581 Secondary hyperparathyroidism of renal origin: Secondary | ICD-10-CM | POA: Diagnosis not present

## 2012-12-23 DIAGNOSIS — N186 End stage renal disease: Secondary | ICD-10-CM | POA: Diagnosis not present

## 2012-12-24 DIAGNOSIS — IMO0002 Reserved for concepts with insufficient information to code with codable children: Secondary | ICD-10-CM | POA: Diagnosis not present

## 2012-12-24 DIAGNOSIS — L97509 Non-pressure chronic ulcer of other part of unspecified foot with unspecified severity: Secondary | ICD-10-CM | POA: Diagnosis not present

## 2012-12-24 DIAGNOSIS — I739 Peripheral vascular disease, unspecified: Secondary | ICD-10-CM | POA: Diagnosis not present

## 2012-12-25 DIAGNOSIS — N2581 Secondary hyperparathyroidism of renal origin: Secondary | ICD-10-CM | POA: Diagnosis not present

## 2012-12-25 DIAGNOSIS — N186 End stage renal disease: Secondary | ICD-10-CM | POA: Diagnosis not present

## 2012-12-25 DIAGNOSIS — D509 Iron deficiency anemia, unspecified: Secondary | ICD-10-CM | POA: Diagnosis not present

## 2012-12-27 DIAGNOSIS — N186 End stage renal disease: Secondary | ICD-10-CM | POA: Diagnosis not present

## 2012-12-28 DIAGNOSIS — D509 Iron deficiency anemia, unspecified: Secondary | ICD-10-CM | POA: Diagnosis not present

## 2012-12-28 DIAGNOSIS — N2581 Secondary hyperparathyroidism of renal origin: Secondary | ICD-10-CM | POA: Diagnosis not present

## 2012-12-28 DIAGNOSIS — N186 End stage renal disease: Secondary | ICD-10-CM | POA: Diagnosis not present

## 2012-12-28 DIAGNOSIS — Z23 Encounter for immunization: Secondary | ICD-10-CM | POA: Diagnosis not present

## 2012-12-30 DIAGNOSIS — N2581 Secondary hyperparathyroidism of renal origin: Secondary | ICD-10-CM | POA: Diagnosis not present

## 2012-12-30 DIAGNOSIS — D509 Iron deficiency anemia, unspecified: Secondary | ICD-10-CM | POA: Diagnosis not present

## 2012-12-30 DIAGNOSIS — Z23 Encounter for immunization: Secondary | ICD-10-CM | POA: Diagnosis not present

## 2012-12-30 DIAGNOSIS — N186 End stage renal disease: Secondary | ICD-10-CM | POA: Diagnosis not present

## 2012-12-31 DIAGNOSIS — G589 Mononeuropathy, unspecified: Secondary | ICD-10-CM | POA: Diagnosis not present

## 2012-12-31 DIAGNOSIS — I739 Peripheral vascular disease, unspecified: Secondary | ICD-10-CM | POA: Diagnosis not present

## 2012-12-31 DIAGNOSIS — N186 End stage renal disease: Secondary | ICD-10-CM | POA: Diagnosis not present

## 2012-12-31 DIAGNOSIS — Z992 Dependence on renal dialysis: Secondary | ICD-10-CM | POA: Diagnosis not present

## 2012-12-31 DIAGNOSIS — IMO0002 Reserved for concepts with insufficient information to code with codable children: Secondary | ICD-10-CM | POA: Diagnosis not present

## 2013-01-01 DIAGNOSIS — N186 End stage renal disease: Secondary | ICD-10-CM | POA: Diagnosis not present

## 2013-01-01 DIAGNOSIS — D509 Iron deficiency anemia, unspecified: Secondary | ICD-10-CM | POA: Diagnosis not present

## 2013-01-01 DIAGNOSIS — N2581 Secondary hyperparathyroidism of renal origin: Secondary | ICD-10-CM | POA: Diagnosis not present

## 2013-01-01 DIAGNOSIS — Z23 Encounter for immunization: Secondary | ICD-10-CM | POA: Diagnosis not present

## 2013-01-04 DIAGNOSIS — Z23 Encounter for immunization: Secondary | ICD-10-CM | POA: Diagnosis not present

## 2013-01-04 DIAGNOSIS — D509 Iron deficiency anemia, unspecified: Secondary | ICD-10-CM | POA: Diagnosis not present

## 2013-01-04 DIAGNOSIS — N2581 Secondary hyperparathyroidism of renal origin: Secondary | ICD-10-CM | POA: Diagnosis not present

## 2013-01-04 DIAGNOSIS — N186 End stage renal disease: Secondary | ICD-10-CM | POA: Diagnosis not present

## 2013-01-06 DIAGNOSIS — N2581 Secondary hyperparathyroidism of renal origin: Secondary | ICD-10-CM | POA: Diagnosis not present

## 2013-01-06 DIAGNOSIS — N186 End stage renal disease: Secondary | ICD-10-CM | POA: Diagnosis not present

## 2013-01-06 DIAGNOSIS — D509 Iron deficiency anemia, unspecified: Secondary | ICD-10-CM | POA: Diagnosis not present

## 2013-01-06 DIAGNOSIS — Z23 Encounter for immunization: Secondary | ICD-10-CM | POA: Diagnosis not present

## 2013-01-07 DIAGNOSIS — Z992 Dependence on renal dialysis: Secondary | ICD-10-CM | POA: Diagnosis not present

## 2013-01-07 DIAGNOSIS — IMO0002 Reserved for concepts with insufficient information to code with codable children: Secondary | ICD-10-CM | POA: Diagnosis not present

## 2013-01-07 DIAGNOSIS — I739 Peripheral vascular disease, unspecified: Secondary | ICD-10-CM | POA: Diagnosis not present

## 2013-01-07 DIAGNOSIS — G589 Mononeuropathy, unspecified: Secondary | ICD-10-CM | POA: Diagnosis not present

## 2013-01-07 DIAGNOSIS — N186 End stage renal disease: Secondary | ICD-10-CM | POA: Diagnosis not present

## 2013-01-08 DIAGNOSIS — D509 Iron deficiency anemia, unspecified: Secondary | ICD-10-CM | POA: Diagnosis not present

## 2013-01-08 DIAGNOSIS — N186 End stage renal disease: Secondary | ICD-10-CM | POA: Diagnosis not present

## 2013-01-08 DIAGNOSIS — Z23 Encounter for immunization: Secondary | ICD-10-CM | POA: Diagnosis not present

## 2013-01-08 DIAGNOSIS — N2581 Secondary hyperparathyroidism of renal origin: Secondary | ICD-10-CM | POA: Diagnosis not present

## 2013-01-11 DIAGNOSIS — D509 Iron deficiency anemia, unspecified: Secondary | ICD-10-CM | POA: Diagnosis not present

## 2013-01-11 DIAGNOSIS — Z23 Encounter for immunization: Secondary | ICD-10-CM | POA: Diagnosis not present

## 2013-01-11 DIAGNOSIS — N2581 Secondary hyperparathyroidism of renal origin: Secondary | ICD-10-CM | POA: Diagnosis not present

## 2013-01-11 DIAGNOSIS — N186 End stage renal disease: Secondary | ICD-10-CM | POA: Diagnosis not present

## 2013-01-13 DIAGNOSIS — N186 End stage renal disease: Secondary | ICD-10-CM | POA: Diagnosis not present

## 2013-01-13 DIAGNOSIS — Z23 Encounter for immunization: Secondary | ICD-10-CM | POA: Diagnosis not present

## 2013-01-13 DIAGNOSIS — N2581 Secondary hyperparathyroidism of renal origin: Secondary | ICD-10-CM | POA: Diagnosis not present

## 2013-01-13 DIAGNOSIS — D509 Iron deficiency anemia, unspecified: Secondary | ICD-10-CM | POA: Diagnosis not present

## 2013-01-15 DIAGNOSIS — D509 Iron deficiency anemia, unspecified: Secondary | ICD-10-CM | POA: Diagnosis not present

## 2013-01-15 DIAGNOSIS — Z23 Encounter for immunization: Secondary | ICD-10-CM | POA: Diagnosis not present

## 2013-01-15 DIAGNOSIS — N186 End stage renal disease: Secondary | ICD-10-CM | POA: Diagnosis not present

## 2013-01-15 DIAGNOSIS — N2581 Secondary hyperparathyroidism of renal origin: Secondary | ICD-10-CM | POA: Diagnosis not present

## 2013-01-18 DIAGNOSIS — N2581 Secondary hyperparathyroidism of renal origin: Secondary | ICD-10-CM | POA: Diagnosis not present

## 2013-01-18 DIAGNOSIS — N186 End stage renal disease: Secondary | ICD-10-CM | POA: Diagnosis not present

## 2013-01-18 DIAGNOSIS — Z23 Encounter for immunization: Secondary | ICD-10-CM | POA: Diagnosis not present

## 2013-01-18 DIAGNOSIS — D509 Iron deficiency anemia, unspecified: Secondary | ICD-10-CM | POA: Diagnosis not present

## 2013-01-20 DIAGNOSIS — N2581 Secondary hyperparathyroidism of renal origin: Secondary | ICD-10-CM | POA: Diagnosis not present

## 2013-01-20 DIAGNOSIS — Z23 Encounter for immunization: Secondary | ICD-10-CM | POA: Diagnosis not present

## 2013-01-20 DIAGNOSIS — D509 Iron deficiency anemia, unspecified: Secondary | ICD-10-CM | POA: Diagnosis not present

## 2013-01-20 DIAGNOSIS — G589 Mononeuropathy, unspecified: Secondary | ICD-10-CM | POA: Diagnosis not present

## 2013-01-20 DIAGNOSIS — I739 Peripheral vascular disease, unspecified: Secondary | ICD-10-CM | POA: Diagnosis not present

## 2013-01-20 DIAGNOSIS — IMO0002 Reserved for concepts with insufficient information to code with codable children: Secondary | ICD-10-CM | POA: Diagnosis not present

## 2013-01-20 DIAGNOSIS — Z992 Dependence on renal dialysis: Secondary | ICD-10-CM | POA: Diagnosis not present

## 2013-01-20 DIAGNOSIS — N186 End stage renal disease: Secondary | ICD-10-CM | POA: Diagnosis not present

## 2013-01-22 DIAGNOSIS — Z23 Encounter for immunization: Secondary | ICD-10-CM | POA: Diagnosis not present

## 2013-01-22 DIAGNOSIS — N2581 Secondary hyperparathyroidism of renal origin: Secondary | ICD-10-CM | POA: Diagnosis not present

## 2013-01-22 DIAGNOSIS — D509 Iron deficiency anemia, unspecified: Secondary | ICD-10-CM | POA: Diagnosis not present

## 2013-01-22 DIAGNOSIS — N186 End stage renal disease: Secondary | ICD-10-CM | POA: Diagnosis not present

## 2013-01-25 DIAGNOSIS — N186 End stage renal disease: Secondary | ICD-10-CM | POA: Diagnosis not present

## 2013-01-25 DIAGNOSIS — D509 Iron deficiency anemia, unspecified: Secondary | ICD-10-CM | POA: Diagnosis not present

## 2013-01-25 DIAGNOSIS — N2581 Secondary hyperparathyroidism of renal origin: Secondary | ICD-10-CM | POA: Diagnosis not present

## 2013-01-25 DIAGNOSIS — Z23 Encounter for immunization: Secondary | ICD-10-CM | POA: Diagnosis not present

## 2013-01-26 DIAGNOSIS — N186 End stage renal disease: Secondary | ICD-10-CM | POA: Diagnosis not present

## 2013-01-27 DIAGNOSIS — N2581 Secondary hyperparathyroidism of renal origin: Secondary | ICD-10-CM | POA: Diagnosis not present

## 2013-01-27 DIAGNOSIS — Z992 Dependence on renal dialysis: Secondary | ICD-10-CM | POA: Diagnosis not present

## 2013-01-27 DIAGNOSIS — D509 Iron deficiency anemia, unspecified: Secondary | ICD-10-CM | POA: Diagnosis not present

## 2013-01-27 DIAGNOSIS — N186 End stage renal disease: Secondary | ICD-10-CM | POA: Diagnosis not present

## 2013-02-03 DIAGNOSIS — I10 Essential (primary) hypertension: Secondary | ICD-10-CM | POA: Diagnosis not present

## 2013-02-03 DIAGNOSIS — N186 End stage renal disease: Secondary | ICD-10-CM | POA: Diagnosis not present

## 2013-02-04 DIAGNOSIS — I739 Peripheral vascular disease, unspecified: Secondary | ICD-10-CM | POA: Diagnosis not present

## 2013-02-04 DIAGNOSIS — L97509 Non-pressure chronic ulcer of other part of unspecified foot with unspecified severity: Secondary | ICD-10-CM | POA: Diagnosis not present

## 2013-02-04 DIAGNOSIS — L02619 Cutaneous abscess of unspecified foot: Secondary | ICD-10-CM | POA: Diagnosis not present

## 2013-02-16 DIAGNOSIS — M25569 Pain in unspecified knee: Secondary | ICD-10-CM | POA: Diagnosis not present

## 2013-02-16 DIAGNOSIS — N19 Unspecified kidney failure: Secondary | ICD-10-CM | POA: Diagnosis not present

## 2013-02-17 DIAGNOSIS — N186 End stage renal disease: Secondary | ICD-10-CM | POA: Diagnosis not present

## 2013-02-18 ENCOUNTER — Ambulatory Visit (HOSPITAL_COMMUNITY)
Admission: RE | Admit: 2013-02-18 | Discharge: 2013-02-18 | Disposition: A | Discharge status: Home / Self Care | Payer: Medicare Other | Source: Ambulatory Visit | Attending: Internal Medicine | Admitting: Internal Medicine

## 2013-02-18 ENCOUNTER — Other Ambulatory Visit (HOSPITAL_COMMUNITY): Payer: Self-pay | Admitting: Internal Medicine

## 2013-02-18 DIAGNOSIS — M25569 Pain in unspecified knee: Secondary | ICD-10-CM | POA: Insufficient documentation

## 2013-02-18 DIAGNOSIS — M25562 Pain in left knee: Secondary | ICD-10-CM

## 2013-02-18 DIAGNOSIS — M1710 Unilateral primary osteoarthritis, unspecified knee: Secondary | ICD-10-CM | POA: Diagnosis not present

## 2013-02-25 DIAGNOSIS — L02619 Cutaneous abscess of unspecified foot: Secondary | ICD-10-CM | POA: Diagnosis not present

## 2013-02-25 DIAGNOSIS — L97409 Non-pressure chronic ulcer of unspecified heel and midfoot with unspecified severity: Secondary | ICD-10-CM | POA: Diagnosis not present

## 2013-02-25 DIAGNOSIS — I10 Essential (primary) hypertension: Secondary | ICD-10-CM | POA: Diagnosis not present

## 2013-02-25 DIAGNOSIS — L97509 Non-pressure chronic ulcer of other part of unspecified foot with unspecified severity: Secondary | ICD-10-CM | POA: Diagnosis not present

## 2013-02-25 DIAGNOSIS — M1990 Unspecified osteoarthritis, unspecified site: Secondary | ICD-10-CM | POA: Diagnosis not present

## 2013-02-25 DIAGNOSIS — N19 Unspecified kidney failure: Secondary | ICD-10-CM | POA: Diagnosis not present

## 2013-02-26 DIAGNOSIS — N186 End stage renal disease: Secondary | ICD-10-CM | POA: Diagnosis not present

## 2013-03-01 DIAGNOSIS — N186 End stage renal disease: Secondary | ICD-10-CM | POA: Diagnosis not present

## 2013-03-01 DIAGNOSIS — L97909 Non-pressure chronic ulcer of unspecified part of unspecified lower leg with unspecified severity: Secondary | ICD-10-CM | POA: Diagnosis not present

## 2013-03-01 DIAGNOSIS — D509 Iron deficiency anemia, unspecified: Secondary | ICD-10-CM | POA: Diagnosis not present

## 2013-03-01 DIAGNOSIS — G589 Mononeuropathy, unspecified: Secondary | ICD-10-CM | POA: Diagnosis not present

## 2013-03-01 DIAGNOSIS — N2581 Secondary hyperparathyroidism of renal origin: Secondary | ICD-10-CM | POA: Diagnosis not present

## 2013-03-03 DIAGNOSIS — D509 Iron deficiency anemia, unspecified: Secondary | ICD-10-CM | POA: Diagnosis not present

## 2013-03-03 DIAGNOSIS — N2581 Secondary hyperparathyroidism of renal origin: Secondary | ICD-10-CM | POA: Diagnosis not present

## 2013-03-03 DIAGNOSIS — N186 End stage renal disease: Secondary | ICD-10-CM | POA: Diagnosis not present

## 2013-03-04 DIAGNOSIS — L97409 Non-pressure chronic ulcer of unspecified heel and midfoot with unspecified severity: Secondary | ICD-10-CM | POA: Diagnosis not present

## 2013-03-04 DIAGNOSIS — G589 Mononeuropathy, unspecified: Secondary | ICD-10-CM | POA: Diagnosis not present

## 2013-03-04 DIAGNOSIS — L97909 Non-pressure chronic ulcer of unspecified part of unspecified lower leg with unspecified severity: Secondary | ICD-10-CM | POA: Diagnosis not present

## 2013-03-04 DIAGNOSIS — I739 Peripheral vascular disease, unspecified: Secondary | ICD-10-CM | POA: Diagnosis not present

## 2013-03-05 DIAGNOSIS — N186 End stage renal disease: Secondary | ICD-10-CM | POA: Diagnosis not present

## 2013-03-05 DIAGNOSIS — N2581 Secondary hyperparathyroidism of renal origin: Secondary | ICD-10-CM | POA: Diagnosis not present

## 2013-03-05 DIAGNOSIS — D509 Iron deficiency anemia, unspecified: Secondary | ICD-10-CM | POA: Diagnosis not present

## 2013-03-08 DIAGNOSIS — D509 Iron deficiency anemia, unspecified: Secondary | ICD-10-CM | POA: Diagnosis not present

## 2013-03-08 DIAGNOSIS — N2581 Secondary hyperparathyroidism of renal origin: Secondary | ICD-10-CM | POA: Diagnosis not present

## 2013-03-08 DIAGNOSIS — N186 End stage renal disease: Secondary | ICD-10-CM | POA: Diagnosis not present

## 2013-03-09 DIAGNOSIS — N19 Unspecified kidney failure: Secondary | ICD-10-CM | POA: Diagnosis not present

## 2013-03-09 DIAGNOSIS — I10 Essential (primary) hypertension: Secondary | ICD-10-CM | POA: Diagnosis not present

## 2013-03-09 DIAGNOSIS — M109 Gout, unspecified: Secondary | ICD-10-CM | POA: Diagnosis not present

## 2013-03-10 DIAGNOSIS — N186 End stage renal disease: Secondary | ICD-10-CM | POA: Diagnosis not present

## 2013-03-10 DIAGNOSIS — D509 Iron deficiency anemia, unspecified: Secondary | ICD-10-CM | POA: Diagnosis not present

## 2013-03-10 DIAGNOSIS — N2581 Secondary hyperparathyroidism of renal origin: Secondary | ICD-10-CM | POA: Diagnosis not present

## 2013-03-12 DIAGNOSIS — N186 End stage renal disease: Secondary | ICD-10-CM | POA: Diagnosis not present

## 2013-03-12 DIAGNOSIS — D509 Iron deficiency anemia, unspecified: Secondary | ICD-10-CM | POA: Diagnosis not present

## 2013-03-12 DIAGNOSIS — N2581 Secondary hyperparathyroidism of renal origin: Secondary | ICD-10-CM | POA: Diagnosis not present

## 2013-03-15 DIAGNOSIS — N186 End stage renal disease: Secondary | ICD-10-CM | POA: Diagnosis not present

## 2013-03-15 DIAGNOSIS — D509 Iron deficiency anemia, unspecified: Secondary | ICD-10-CM | POA: Diagnosis not present

## 2013-03-15 DIAGNOSIS — N2581 Secondary hyperparathyroidism of renal origin: Secondary | ICD-10-CM | POA: Diagnosis not present

## 2013-03-17 DIAGNOSIS — N2581 Secondary hyperparathyroidism of renal origin: Secondary | ICD-10-CM | POA: Diagnosis not present

## 2013-03-17 DIAGNOSIS — D509 Iron deficiency anemia, unspecified: Secondary | ICD-10-CM | POA: Diagnosis not present

## 2013-03-17 DIAGNOSIS — N186 End stage renal disease: Secondary | ICD-10-CM | POA: Diagnosis not present

## 2013-03-19 DIAGNOSIS — N2581 Secondary hyperparathyroidism of renal origin: Secondary | ICD-10-CM | POA: Diagnosis not present

## 2013-03-19 DIAGNOSIS — N186 End stage renal disease: Secondary | ICD-10-CM | POA: Diagnosis not present

## 2013-03-19 DIAGNOSIS — D509 Iron deficiency anemia, unspecified: Secondary | ICD-10-CM | POA: Diagnosis not present

## 2013-03-22 DIAGNOSIS — N186 End stage renal disease: Secondary | ICD-10-CM | POA: Diagnosis not present

## 2013-03-22 DIAGNOSIS — D509 Iron deficiency anemia, unspecified: Secondary | ICD-10-CM | POA: Diagnosis not present

## 2013-03-22 DIAGNOSIS — N2581 Secondary hyperparathyroidism of renal origin: Secondary | ICD-10-CM | POA: Diagnosis not present

## 2013-03-24 DIAGNOSIS — D509 Iron deficiency anemia, unspecified: Secondary | ICD-10-CM | POA: Diagnosis not present

## 2013-03-24 DIAGNOSIS — N2581 Secondary hyperparathyroidism of renal origin: Secondary | ICD-10-CM | POA: Diagnosis not present

## 2013-03-24 DIAGNOSIS — N186 End stage renal disease: Secondary | ICD-10-CM | POA: Diagnosis not present

## 2013-03-26 DIAGNOSIS — D509 Iron deficiency anemia, unspecified: Secondary | ICD-10-CM | POA: Diagnosis not present

## 2013-03-26 DIAGNOSIS — N2581 Secondary hyperparathyroidism of renal origin: Secondary | ICD-10-CM | POA: Diagnosis not present

## 2013-03-26 DIAGNOSIS — N186 End stage renal disease: Secondary | ICD-10-CM | POA: Diagnosis not present

## 2013-03-28 DIAGNOSIS — N186 End stage renal disease: Secondary | ICD-10-CM | POA: Diagnosis not present

## 2013-03-29 DIAGNOSIS — N2581 Secondary hyperparathyroidism of renal origin: Secondary | ICD-10-CM | POA: Diagnosis not present

## 2013-03-29 DIAGNOSIS — N186 End stage renal disease: Secondary | ICD-10-CM | POA: Diagnosis not present

## 2013-03-29 DIAGNOSIS — D509 Iron deficiency anemia, unspecified: Secondary | ICD-10-CM | POA: Diagnosis not present

## 2013-03-30 DIAGNOSIS — I779 Disorder of arteries and arterioles, unspecified: Secondary | ICD-10-CM | POA: Diagnosis not present

## 2013-03-30 DIAGNOSIS — I739 Peripheral vascular disease, unspecified: Secondary | ICD-10-CM | POA: Diagnosis not present

## 2013-03-30 DIAGNOSIS — N186 End stage renal disease: Secondary | ICD-10-CM | POA: Diagnosis not present

## 2013-03-31 DIAGNOSIS — N186 End stage renal disease: Secondary | ICD-10-CM | POA: Diagnosis not present

## 2013-04-06 DIAGNOSIS — N186 End stage renal disease: Secondary | ICD-10-CM | POA: Diagnosis not present

## 2013-04-06 DIAGNOSIS — T82898A Other specified complication of vascular prosthetic devices, implants and grafts, initial encounter: Secondary | ICD-10-CM | POA: Diagnosis not present

## 2013-04-06 DIAGNOSIS — I871 Compression of vein: Secondary | ICD-10-CM | POA: Diagnosis not present

## 2013-04-28 DIAGNOSIS — N186 End stage renal disease: Secondary | ICD-10-CM | POA: Diagnosis not present

## 2013-04-30 DIAGNOSIS — Z23 Encounter for immunization: Secondary | ICD-10-CM | POA: Diagnosis not present

## 2013-04-30 DIAGNOSIS — N2581 Secondary hyperparathyroidism of renal origin: Secondary | ICD-10-CM | POA: Diagnosis not present

## 2013-04-30 DIAGNOSIS — N186 End stage renal disease: Secondary | ICD-10-CM | POA: Diagnosis not present

## 2013-04-30 DIAGNOSIS — D509 Iron deficiency anemia, unspecified: Secondary | ICD-10-CM | POA: Diagnosis not present

## 2013-05-05 DIAGNOSIS — N186 End stage renal disease: Secondary | ICD-10-CM | POA: Diagnosis not present

## 2013-05-18 DIAGNOSIS — N19 Unspecified kidney failure: Secondary | ICD-10-CM | POA: Diagnosis not present

## 2013-05-18 DIAGNOSIS — I10 Essential (primary) hypertension: Secondary | ICD-10-CM | POA: Diagnosis not present

## 2013-05-29 DIAGNOSIS — N186 End stage renal disease: Secondary | ICD-10-CM | POA: Diagnosis not present

## 2013-05-31 DIAGNOSIS — D509 Iron deficiency anemia, unspecified: Secondary | ICD-10-CM | POA: Diagnosis not present

## 2013-05-31 DIAGNOSIS — N2581 Secondary hyperparathyroidism of renal origin: Secondary | ICD-10-CM | POA: Diagnosis not present

## 2013-05-31 DIAGNOSIS — N186 End stage renal disease: Secondary | ICD-10-CM | POA: Diagnosis not present

## 2013-06-02 DIAGNOSIS — N186 End stage renal disease: Secondary | ICD-10-CM | POA: Diagnosis not present

## 2013-06-26 DIAGNOSIS — N186 End stage renal disease: Secondary | ICD-10-CM | POA: Diagnosis not present

## 2013-06-28 DIAGNOSIS — N186 End stage renal disease: Secondary | ICD-10-CM | POA: Diagnosis not present

## 2013-06-28 DIAGNOSIS — D509 Iron deficiency anemia, unspecified: Secondary | ICD-10-CM | POA: Diagnosis not present

## 2013-06-28 DIAGNOSIS — N2581 Secondary hyperparathyroidism of renal origin: Secondary | ICD-10-CM | POA: Diagnosis not present

## 2013-06-28 DIAGNOSIS — D631 Anemia in chronic kidney disease: Secondary | ICD-10-CM | POA: Diagnosis not present

## 2013-07-20 DIAGNOSIS — L97409 Non-pressure chronic ulcer of unspecified heel and midfoot with unspecified severity: Secondary | ICD-10-CM | POA: Diagnosis not present

## 2013-07-20 DIAGNOSIS — L97509 Non-pressure chronic ulcer of other part of unspecified foot with unspecified severity: Secondary | ICD-10-CM | POA: Diagnosis not present

## 2013-07-20 DIAGNOSIS — I999 Unspecified disorder of circulatory system: Secondary | ICD-10-CM | POA: Diagnosis not present

## 2013-07-27 DIAGNOSIS — I739 Peripheral vascular disease, unspecified: Secondary | ICD-10-CM | POA: Diagnosis not present

## 2013-07-27 DIAGNOSIS — L98499 Non-pressure chronic ulcer of skin of other sites with unspecified severity: Secondary | ICD-10-CM | POA: Diagnosis not present

## 2013-07-27 DIAGNOSIS — I999 Unspecified disorder of circulatory system: Secondary | ICD-10-CM | POA: Diagnosis not present

## 2013-07-27 DIAGNOSIS — N186 End stage renal disease: Secondary | ICD-10-CM | POA: Diagnosis not present

## 2013-07-27 DIAGNOSIS — L97509 Non-pressure chronic ulcer of other part of unspecified foot with unspecified severity: Secondary | ICD-10-CM | POA: Diagnosis not present

## 2013-07-28 DIAGNOSIS — N2581 Secondary hyperparathyroidism of renal origin: Secondary | ICD-10-CM | POA: Diagnosis not present

## 2013-07-28 DIAGNOSIS — N186 End stage renal disease: Secondary | ICD-10-CM | POA: Diagnosis not present

## 2013-07-28 DIAGNOSIS — D631 Anemia in chronic kidney disease: Secondary | ICD-10-CM | POA: Diagnosis not present

## 2013-07-28 DIAGNOSIS — N039 Chronic nephritic syndrome with unspecified morphologic changes: Secondary | ICD-10-CM | POA: Diagnosis not present

## 2013-07-28 DIAGNOSIS — D509 Iron deficiency anemia, unspecified: Secondary | ICD-10-CM | POA: Diagnosis not present

## 2013-07-30 DIAGNOSIS — N039 Chronic nephritic syndrome with unspecified morphologic changes: Secondary | ICD-10-CM | POA: Diagnosis not present

## 2013-07-30 DIAGNOSIS — N2581 Secondary hyperparathyroidism of renal origin: Secondary | ICD-10-CM | POA: Diagnosis not present

## 2013-07-30 DIAGNOSIS — D631 Anemia in chronic kidney disease: Secondary | ICD-10-CM | POA: Diagnosis not present

## 2013-07-30 DIAGNOSIS — D509 Iron deficiency anemia, unspecified: Secondary | ICD-10-CM | POA: Diagnosis not present

## 2013-07-30 DIAGNOSIS — N186 End stage renal disease: Secondary | ICD-10-CM | POA: Diagnosis not present

## 2013-08-02 DIAGNOSIS — N186 End stage renal disease: Secondary | ICD-10-CM | POA: Diagnosis not present

## 2013-08-02 DIAGNOSIS — D631 Anemia in chronic kidney disease: Secondary | ICD-10-CM | POA: Diagnosis not present

## 2013-08-02 DIAGNOSIS — D509 Iron deficiency anemia, unspecified: Secondary | ICD-10-CM | POA: Diagnosis not present

## 2013-08-02 DIAGNOSIS — N2581 Secondary hyperparathyroidism of renal origin: Secondary | ICD-10-CM | POA: Diagnosis not present

## 2013-08-03 DIAGNOSIS — I999 Unspecified disorder of circulatory system: Secondary | ICD-10-CM | POA: Diagnosis not present

## 2013-08-03 DIAGNOSIS — L8994 Pressure ulcer of unspecified site, stage 4: Secondary | ICD-10-CM | POA: Diagnosis not present

## 2013-08-03 DIAGNOSIS — A4902 Methicillin resistant Staphylococcus aureus infection, unspecified site: Secondary | ICD-10-CM | POA: Diagnosis not present

## 2013-08-03 DIAGNOSIS — L98499 Non-pressure chronic ulcer of skin of other sites with unspecified severity: Secondary | ICD-10-CM | POA: Diagnosis not present

## 2013-08-03 DIAGNOSIS — I739 Peripheral vascular disease, unspecified: Secondary | ICD-10-CM | POA: Diagnosis not present

## 2013-08-03 DIAGNOSIS — L97809 Non-pressure chronic ulcer of other part of unspecified lower leg with unspecified severity: Secondary | ICD-10-CM | POA: Diagnosis not present

## 2013-08-03 DIAGNOSIS — L89899 Pressure ulcer of other site, unspecified stage: Secondary | ICD-10-CM | POA: Diagnosis not present

## 2013-08-04 DIAGNOSIS — D631 Anemia in chronic kidney disease: Secondary | ICD-10-CM | POA: Diagnosis not present

## 2013-08-04 DIAGNOSIS — N2581 Secondary hyperparathyroidism of renal origin: Secondary | ICD-10-CM | POA: Diagnosis not present

## 2013-08-04 DIAGNOSIS — N186 End stage renal disease: Secondary | ICD-10-CM | POA: Diagnosis not present

## 2013-08-04 DIAGNOSIS — D509 Iron deficiency anemia, unspecified: Secondary | ICD-10-CM | POA: Diagnosis not present

## 2013-08-06 DIAGNOSIS — D631 Anemia in chronic kidney disease: Secondary | ICD-10-CM | POA: Diagnosis not present

## 2013-08-06 DIAGNOSIS — N2581 Secondary hyperparathyroidism of renal origin: Secondary | ICD-10-CM | POA: Diagnosis not present

## 2013-08-06 DIAGNOSIS — N186 End stage renal disease: Secondary | ICD-10-CM | POA: Diagnosis not present

## 2013-08-06 DIAGNOSIS — D509 Iron deficiency anemia, unspecified: Secondary | ICD-10-CM | POA: Diagnosis not present

## 2013-08-09 DIAGNOSIS — N186 End stage renal disease: Secondary | ICD-10-CM | POA: Diagnosis not present

## 2013-08-09 DIAGNOSIS — D509 Iron deficiency anemia, unspecified: Secondary | ICD-10-CM | POA: Diagnosis not present

## 2013-08-09 DIAGNOSIS — N2581 Secondary hyperparathyroidism of renal origin: Secondary | ICD-10-CM | POA: Diagnosis not present

## 2013-08-09 DIAGNOSIS — D631 Anemia in chronic kidney disease: Secondary | ICD-10-CM | POA: Diagnosis not present

## 2013-08-10 DIAGNOSIS — L97809 Non-pressure chronic ulcer of other part of unspecified lower leg with unspecified severity: Secondary | ICD-10-CM | POA: Diagnosis not present

## 2013-08-10 DIAGNOSIS — L89899 Pressure ulcer of other site, unspecified stage: Secondary | ICD-10-CM | POA: Diagnosis not present

## 2013-08-10 DIAGNOSIS — I739 Peripheral vascular disease, unspecified: Secondary | ICD-10-CM | POA: Diagnosis not present

## 2013-08-10 DIAGNOSIS — A4902 Methicillin resistant Staphylococcus aureus infection, unspecified site: Secondary | ICD-10-CM | POA: Diagnosis not present

## 2013-08-10 DIAGNOSIS — I999 Unspecified disorder of circulatory system: Secondary | ICD-10-CM | POA: Diagnosis not present

## 2013-08-10 DIAGNOSIS — L98499 Non-pressure chronic ulcer of skin of other sites with unspecified severity: Secondary | ICD-10-CM | POA: Diagnosis not present

## 2013-08-10 DIAGNOSIS — L8994 Pressure ulcer of unspecified site, stage 4: Secondary | ICD-10-CM | POA: Diagnosis not present

## 2013-08-11 DIAGNOSIS — N186 End stage renal disease: Secondary | ICD-10-CM | POA: Diagnosis not present

## 2013-08-11 DIAGNOSIS — D631 Anemia in chronic kidney disease: Secondary | ICD-10-CM | POA: Diagnosis not present

## 2013-08-11 DIAGNOSIS — D509 Iron deficiency anemia, unspecified: Secondary | ICD-10-CM | POA: Diagnosis not present

## 2013-08-11 DIAGNOSIS — N039 Chronic nephritic syndrome with unspecified morphologic changes: Secondary | ICD-10-CM | POA: Diagnosis not present

## 2013-08-11 DIAGNOSIS — N2581 Secondary hyperparathyroidism of renal origin: Secondary | ICD-10-CM | POA: Diagnosis not present

## 2013-08-13 DIAGNOSIS — D631 Anemia in chronic kidney disease: Secondary | ICD-10-CM | POA: Diagnosis not present

## 2013-08-13 DIAGNOSIS — N2581 Secondary hyperparathyroidism of renal origin: Secondary | ICD-10-CM | POA: Diagnosis not present

## 2013-08-13 DIAGNOSIS — D509 Iron deficiency anemia, unspecified: Secondary | ICD-10-CM | POA: Diagnosis not present

## 2013-08-13 DIAGNOSIS — N186 End stage renal disease: Secondary | ICD-10-CM | POA: Diagnosis not present

## 2013-08-16 DIAGNOSIS — N039 Chronic nephritic syndrome with unspecified morphologic changes: Secondary | ICD-10-CM | POA: Diagnosis not present

## 2013-08-16 DIAGNOSIS — D631 Anemia in chronic kidney disease: Secondary | ICD-10-CM | POA: Diagnosis not present

## 2013-08-16 DIAGNOSIS — N2581 Secondary hyperparathyroidism of renal origin: Secondary | ICD-10-CM | POA: Diagnosis not present

## 2013-08-16 DIAGNOSIS — D509 Iron deficiency anemia, unspecified: Secondary | ICD-10-CM | POA: Diagnosis not present

## 2013-08-16 DIAGNOSIS — N186 End stage renal disease: Secondary | ICD-10-CM | POA: Diagnosis not present

## 2013-08-18 DIAGNOSIS — N2581 Secondary hyperparathyroidism of renal origin: Secondary | ICD-10-CM | POA: Diagnosis not present

## 2013-08-18 DIAGNOSIS — D631 Anemia in chronic kidney disease: Secondary | ICD-10-CM | POA: Diagnosis not present

## 2013-08-18 DIAGNOSIS — D509 Iron deficiency anemia, unspecified: Secondary | ICD-10-CM | POA: Diagnosis not present

## 2013-08-18 DIAGNOSIS — N186 End stage renal disease: Secondary | ICD-10-CM | POA: Diagnosis not present

## 2013-08-20 DIAGNOSIS — D509 Iron deficiency anemia, unspecified: Secondary | ICD-10-CM | POA: Diagnosis not present

## 2013-08-20 DIAGNOSIS — N2581 Secondary hyperparathyroidism of renal origin: Secondary | ICD-10-CM | POA: Diagnosis not present

## 2013-08-20 DIAGNOSIS — N186 End stage renal disease: Secondary | ICD-10-CM | POA: Diagnosis not present

## 2013-08-20 DIAGNOSIS — D631 Anemia in chronic kidney disease: Secondary | ICD-10-CM | POA: Diagnosis not present

## 2013-08-23 DIAGNOSIS — N039 Chronic nephritic syndrome with unspecified morphologic changes: Secondary | ICD-10-CM | POA: Diagnosis not present

## 2013-08-23 DIAGNOSIS — D631 Anemia in chronic kidney disease: Secondary | ICD-10-CM | POA: Diagnosis not present

## 2013-08-23 DIAGNOSIS — D509 Iron deficiency anemia, unspecified: Secondary | ICD-10-CM | POA: Diagnosis not present

## 2013-08-23 DIAGNOSIS — N2581 Secondary hyperparathyroidism of renal origin: Secondary | ICD-10-CM | POA: Diagnosis not present

## 2013-08-23 DIAGNOSIS — N186 End stage renal disease: Secondary | ICD-10-CM | POA: Diagnosis not present

## 2013-08-24 DIAGNOSIS — L97809 Non-pressure chronic ulcer of other part of unspecified lower leg with unspecified severity: Secondary | ICD-10-CM | POA: Diagnosis not present

## 2013-08-24 DIAGNOSIS — I10 Essential (primary) hypertension: Secondary | ICD-10-CM | POA: Diagnosis not present

## 2013-08-24 DIAGNOSIS — L8994 Pressure ulcer of unspecified site, stage 4: Secondary | ICD-10-CM | POA: Diagnosis not present

## 2013-08-24 DIAGNOSIS — L89899 Pressure ulcer of other site, unspecified stage: Secondary | ICD-10-CM | POA: Diagnosis not present

## 2013-08-24 DIAGNOSIS — N19 Unspecified kidney failure: Secondary | ICD-10-CM | POA: Diagnosis not present

## 2013-08-24 DIAGNOSIS — A4902 Methicillin resistant Staphylococcus aureus infection, unspecified site: Secondary | ICD-10-CM | POA: Diagnosis not present

## 2013-08-24 DIAGNOSIS — M109 Gout, unspecified: Secondary | ICD-10-CM | POA: Diagnosis not present

## 2013-08-24 DIAGNOSIS — I999 Unspecified disorder of circulatory system: Secondary | ICD-10-CM | POA: Diagnosis not present

## 2013-08-25 ENCOUNTER — Encounter: Payer: Self-pay | Admitting: Vascular Surgery

## 2013-08-25 DIAGNOSIS — D509 Iron deficiency anemia, unspecified: Secondary | ICD-10-CM | POA: Diagnosis not present

## 2013-08-25 DIAGNOSIS — N186 End stage renal disease: Secondary | ICD-10-CM | POA: Diagnosis not present

## 2013-08-25 DIAGNOSIS — D631 Anemia in chronic kidney disease: Secondary | ICD-10-CM | POA: Diagnosis not present

## 2013-08-25 DIAGNOSIS — N2581 Secondary hyperparathyroidism of renal origin: Secondary | ICD-10-CM | POA: Diagnosis not present

## 2013-08-26 ENCOUNTER — Encounter: Payer: Self-pay | Admitting: Vascular Surgery

## 2013-08-26 ENCOUNTER — Ambulatory Visit (INDEPENDENT_AMBULATORY_CARE_PROVIDER_SITE_OTHER): Payer: Medicare Other | Admitting: Vascular Surgery

## 2013-08-26 ENCOUNTER — Ambulatory Visit (HOSPITAL_COMMUNITY)
Admission: RE | Admit: 2013-08-26 | Discharge: 2013-08-26 | Disposition: A | Discharge status: Home / Self Care | Payer: Medicare Other | Source: Ambulatory Visit | Attending: Vascular Surgery | Admitting: Vascular Surgery

## 2013-08-26 VITALS — BP 135/81 | HR 96 | Resp 18 | Ht 65.0 in | Wt 178.4 lb

## 2013-08-26 DIAGNOSIS — N186 End stage renal disease: Secondary | ICD-10-CM | POA: Diagnosis not present

## 2013-08-26 DIAGNOSIS — I739 Peripheral vascular disease, unspecified: Secondary | ICD-10-CM

## 2013-08-26 NOTE — Progress Notes (Signed)
Patient is a 73 year old male with peripheral arterial disease who returns for followup today. He states that the ulcers on his right foot has now completely healed. 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 Nils Pyle. He denies any claudication symptoms. He is also on chronic hemodialysis. He states his right arm fistula is working well. He does still have a callus over the first metatarsal head on the right foot. He underwent atherectomy and angioplasty of his right anterior tibial artery and 2012. Arteriogram in 2013 showed patent in line one-vessel runoff via anterior tibial artery bilaterally.  Review of systems: He denies shortness of breath. He denies chest pain.  Physical exam:  Filed Vitals:   08/26/13 1234  BP: 135/81  Pulse: 96  Resp: 18  Height: 5\' 5"  (1.651 m)  Weight: 178 lb 6.4 oz (80.922 kg)    Lower extremities: Right foot thickened callus over first metatarsal head 3 x 3 cm diameter, left first metatarsal head ulcer 2 cm in diameter 3 mm depth with some granulation tissue at the base no palpable pedal pulses  Data: The patient had bilateral lower extremity duplex exam today which I reviewed and interpreted. This is consistent with his recent arteriogram which showed in-line flow via the anterior tibial artery bilaterally. The anterior tibial artery and all vessels above this are continuing to be patent. There is some stenosis of the mid right superficial femoral artery.  Assessment: Neuropathic ulcer left foot currently being followed by the wound center has in line arterial flow Plan: Followup when necessary if continued deterioration or nonhealing of the left foot and would consider arteriogram at that time  [name], MD [facility] [place] Office: [phone] Pager: [phone]

## 2013-08-27 DIAGNOSIS — D509 Iron deficiency anemia, unspecified: Secondary | ICD-10-CM | POA: Diagnosis not present

## 2013-08-27 DIAGNOSIS — N186 End stage renal disease: Secondary | ICD-10-CM | POA: Diagnosis not present

## 2013-08-27 DIAGNOSIS — N2581 Secondary hyperparathyroidism of renal origin: Secondary | ICD-10-CM | POA: Diagnosis not present

## 2013-08-30 DIAGNOSIS — N186 End stage renal disease: Secondary | ICD-10-CM | POA: Diagnosis not present

## 2013-08-30 DIAGNOSIS — D509 Iron deficiency anemia, unspecified: Secondary | ICD-10-CM | POA: Diagnosis not present

## 2013-08-30 DIAGNOSIS — N2581 Secondary hyperparathyroidism of renal origin: Secondary | ICD-10-CM | POA: Diagnosis not present

## 2013-09-01 DIAGNOSIS — N2581 Secondary hyperparathyroidism of renal origin: Secondary | ICD-10-CM | POA: Diagnosis not present

## 2013-09-01 DIAGNOSIS — D509 Iron deficiency anemia, unspecified: Secondary | ICD-10-CM | POA: Diagnosis not present

## 2013-09-01 DIAGNOSIS — N186 End stage renal disease: Secondary | ICD-10-CM | POA: Diagnosis not present

## 2013-09-03 DIAGNOSIS — N2581 Secondary hyperparathyroidism of renal origin: Secondary | ICD-10-CM | POA: Diagnosis not present

## 2013-09-03 DIAGNOSIS — N186 End stage renal disease: Secondary | ICD-10-CM | POA: Diagnosis not present

## 2013-09-03 DIAGNOSIS — D509 Iron deficiency anemia, unspecified: Secondary | ICD-10-CM | POA: Diagnosis not present

## 2013-09-06 DIAGNOSIS — D509 Iron deficiency anemia, unspecified: Secondary | ICD-10-CM | POA: Diagnosis not present

## 2013-09-06 DIAGNOSIS — N2581 Secondary hyperparathyroidism of renal origin: Secondary | ICD-10-CM | POA: Diagnosis not present

## 2013-09-06 DIAGNOSIS — N186 End stage renal disease: Secondary | ICD-10-CM | POA: Diagnosis not present

## 2013-09-07 DIAGNOSIS — L8990 Pressure ulcer of unspecified site, unspecified stage: Secondary | ICD-10-CM | POA: Diagnosis not present

## 2013-09-07 DIAGNOSIS — L97509 Non-pressure chronic ulcer of other part of unspecified foot with unspecified severity: Secondary | ICD-10-CM | POA: Diagnosis not present

## 2013-09-07 DIAGNOSIS — M869 Osteomyelitis, unspecified: Secondary | ICD-10-CM | POA: Diagnosis not present

## 2013-09-07 DIAGNOSIS — S91109A Unspecified open wound of unspecified toe(s) without damage to nail, initial encounter: Secondary | ICD-10-CM | POA: Diagnosis not present

## 2013-09-07 DIAGNOSIS — L98499 Non-pressure chronic ulcer of skin of other sites with unspecified severity: Secondary | ICD-10-CM | POA: Diagnosis not present

## 2013-09-07 DIAGNOSIS — I739 Peripheral vascular disease, unspecified: Secondary | ICD-10-CM | POA: Diagnosis not present

## 2013-09-07 DIAGNOSIS — Z91199 Patient's noncompliance with other medical treatment and regimen due to unspecified reason: Secondary | ICD-10-CM | POA: Diagnosis not present

## 2013-09-07 DIAGNOSIS — Z9119 Patient's noncompliance with other medical treatment and regimen: Secondary | ICD-10-CM | POA: Diagnosis not present

## 2013-09-07 DIAGNOSIS — L89899 Pressure ulcer of other site, unspecified stage: Secondary | ICD-10-CM | POA: Diagnosis not present

## 2013-09-08 DIAGNOSIS — N186 End stage renal disease: Secondary | ICD-10-CM | POA: Diagnosis not present

## 2013-09-08 DIAGNOSIS — N2581 Secondary hyperparathyroidism of renal origin: Secondary | ICD-10-CM | POA: Diagnosis not present

## 2013-09-08 DIAGNOSIS — D509 Iron deficiency anemia, unspecified: Secondary | ICD-10-CM | POA: Diagnosis not present

## 2013-09-10 DIAGNOSIS — D509 Iron deficiency anemia, unspecified: Secondary | ICD-10-CM | POA: Diagnosis not present

## 2013-09-10 DIAGNOSIS — N186 End stage renal disease: Secondary | ICD-10-CM | POA: Diagnosis not present

## 2013-09-10 DIAGNOSIS — N2581 Secondary hyperparathyroidism of renal origin: Secondary | ICD-10-CM | POA: Diagnosis not present

## 2013-09-13 DIAGNOSIS — N186 End stage renal disease: Secondary | ICD-10-CM | POA: Diagnosis not present

## 2013-09-13 DIAGNOSIS — D509 Iron deficiency anemia, unspecified: Secondary | ICD-10-CM | POA: Diagnosis not present

## 2013-09-13 DIAGNOSIS — N2581 Secondary hyperparathyroidism of renal origin: Secondary | ICD-10-CM | POA: Diagnosis not present

## 2013-09-14 DIAGNOSIS — L89899 Pressure ulcer of other site, unspecified stage: Secondary | ICD-10-CM | POA: Diagnosis not present

## 2013-09-14 DIAGNOSIS — Z9119 Patient's noncompliance with other medical treatment and regimen: Secondary | ICD-10-CM | POA: Diagnosis not present

## 2013-09-14 DIAGNOSIS — L8990 Pressure ulcer of unspecified site, unspecified stage: Secondary | ICD-10-CM | POA: Diagnosis not present

## 2013-09-14 DIAGNOSIS — I739 Peripheral vascular disease, unspecified: Secondary | ICD-10-CM | POA: Diagnosis not present

## 2013-09-14 DIAGNOSIS — Z91199 Patient's noncompliance with other medical treatment and regimen due to unspecified reason: Secondary | ICD-10-CM | POA: Diagnosis not present

## 2013-09-14 DIAGNOSIS — L97509 Non-pressure chronic ulcer of other part of unspecified foot with unspecified severity: Secondary | ICD-10-CM | POA: Diagnosis not present

## 2013-09-15 DIAGNOSIS — N2581 Secondary hyperparathyroidism of renal origin: Secondary | ICD-10-CM | POA: Diagnosis not present

## 2013-09-15 DIAGNOSIS — N186 End stage renal disease: Secondary | ICD-10-CM | POA: Diagnosis not present

## 2013-09-15 DIAGNOSIS — D509 Iron deficiency anemia, unspecified: Secondary | ICD-10-CM | POA: Diagnosis not present

## 2013-09-17 ENCOUNTER — Other Ambulatory Visit (HOSPITAL_COMMUNITY): Payer: Self-pay | Admitting: Urology

## 2013-09-17 DIAGNOSIS — D509 Iron deficiency anemia, unspecified: Secondary | ICD-10-CM | POA: Diagnosis not present

## 2013-09-17 DIAGNOSIS — N2581 Secondary hyperparathyroidism of renal origin: Secondary | ICD-10-CM | POA: Diagnosis not present

## 2013-09-17 DIAGNOSIS — N189 Chronic kidney disease, unspecified: Secondary | ICD-10-CM

## 2013-09-17 DIAGNOSIS — N186 End stage renal disease: Secondary | ICD-10-CM | POA: Diagnosis not present

## 2013-09-20 DIAGNOSIS — N186 End stage renal disease: Secondary | ICD-10-CM | POA: Diagnosis not present

## 2013-09-20 DIAGNOSIS — D509 Iron deficiency anemia, unspecified: Secondary | ICD-10-CM | POA: Diagnosis not present

## 2013-09-20 DIAGNOSIS — N2581 Secondary hyperparathyroidism of renal origin: Secondary | ICD-10-CM | POA: Diagnosis not present

## 2013-09-22 DIAGNOSIS — N2581 Secondary hyperparathyroidism of renal origin: Secondary | ICD-10-CM | POA: Diagnosis not present

## 2013-09-22 DIAGNOSIS — D509 Iron deficiency anemia, unspecified: Secondary | ICD-10-CM | POA: Diagnosis not present

## 2013-09-22 DIAGNOSIS — N186 End stage renal disease: Secondary | ICD-10-CM | POA: Diagnosis not present

## 2013-09-24 DIAGNOSIS — N2581 Secondary hyperparathyroidism of renal origin: Secondary | ICD-10-CM | POA: Diagnosis not present

## 2013-09-24 DIAGNOSIS — D509 Iron deficiency anemia, unspecified: Secondary | ICD-10-CM | POA: Diagnosis not present

## 2013-09-24 DIAGNOSIS — N186 End stage renal disease: Secondary | ICD-10-CM | POA: Diagnosis not present

## 2013-09-26 DIAGNOSIS — N186 End stage renal disease: Secondary | ICD-10-CM | POA: Diagnosis not present

## 2013-09-27 DIAGNOSIS — D509 Iron deficiency anemia, unspecified: Secondary | ICD-10-CM | POA: Diagnosis not present

## 2013-09-27 DIAGNOSIS — N2581 Secondary hyperparathyroidism of renal origin: Secondary | ICD-10-CM | POA: Diagnosis not present

## 2013-09-27 DIAGNOSIS — N186 End stage renal disease: Secondary | ICD-10-CM | POA: Diagnosis not present

## 2013-09-28 ENCOUNTER — Ambulatory Visit (HOSPITAL_COMMUNITY)
Admission: RE | Admit: 2013-09-28 | Discharge: 2013-09-28 | Disposition: A | Discharge status: Home / Self Care | Payer: Medicare Other | Source: Ambulatory Visit | Attending: Urology | Admitting: Urology

## 2013-09-28 DIAGNOSIS — N189 Chronic kidney disease, unspecified: Secondary | ICD-10-CM | POA: Diagnosis not present

## 2013-09-28 DIAGNOSIS — I739 Peripheral vascular disease, unspecified: Secondary | ICD-10-CM | POA: Diagnosis not present

## 2013-09-28 DIAGNOSIS — N289 Disorder of kidney and ureter, unspecified: Secondary | ICD-10-CM | POA: Diagnosis not present

## 2013-09-28 DIAGNOSIS — Z992 Dependence on renal dialysis: Secondary | ICD-10-CM | POA: Diagnosis not present

## 2013-09-28 DIAGNOSIS — L98499 Non-pressure chronic ulcer of skin of other sites with unspecified severity: Secondary | ICD-10-CM | POA: Diagnosis not present

## 2013-09-28 DIAGNOSIS — L8990 Pressure ulcer of unspecified site, unspecified stage: Secondary | ICD-10-CM | POA: Diagnosis not present

## 2013-09-28 DIAGNOSIS — L89899 Pressure ulcer of other site, unspecified stage: Secondary | ICD-10-CM | POA: Diagnosis not present

## 2013-09-28 DIAGNOSIS — N281 Cyst of kidney, acquired: Secondary | ICD-10-CM | POA: Insufficient documentation

## 2013-09-29 DIAGNOSIS — D509 Iron deficiency anemia, unspecified: Secondary | ICD-10-CM | POA: Diagnosis not present

## 2013-09-29 DIAGNOSIS — N186 End stage renal disease: Secondary | ICD-10-CM | POA: Diagnosis not present

## 2013-09-29 DIAGNOSIS — N2581 Secondary hyperparathyroidism of renal origin: Secondary | ICD-10-CM | POA: Diagnosis not present

## 2013-10-01 DIAGNOSIS — D509 Iron deficiency anemia, unspecified: Secondary | ICD-10-CM | POA: Diagnosis not present

## 2013-10-01 DIAGNOSIS — N186 End stage renal disease: Secondary | ICD-10-CM | POA: Diagnosis not present

## 2013-10-01 DIAGNOSIS — N2581 Secondary hyperparathyroidism of renal origin: Secondary | ICD-10-CM | POA: Diagnosis not present

## 2013-10-04 DIAGNOSIS — N186 End stage renal disease: Secondary | ICD-10-CM | POA: Diagnosis not present

## 2013-10-04 DIAGNOSIS — D509 Iron deficiency anemia, unspecified: Secondary | ICD-10-CM | POA: Diagnosis not present

## 2013-10-04 DIAGNOSIS — N2581 Secondary hyperparathyroidism of renal origin: Secondary | ICD-10-CM | POA: Diagnosis not present

## 2013-10-05 DIAGNOSIS — I871 Compression of vein: Secondary | ICD-10-CM | POA: Diagnosis not present

## 2013-10-05 DIAGNOSIS — N186 End stage renal disease: Secondary | ICD-10-CM | POA: Diagnosis not present

## 2013-10-05 DIAGNOSIS — T82898A Other specified complication of vascular prosthetic devices, implants and grafts, initial encounter: Secondary | ICD-10-CM | POA: Diagnosis not present

## 2013-10-06 DIAGNOSIS — D509 Iron deficiency anemia, unspecified: Secondary | ICD-10-CM | POA: Diagnosis not present

## 2013-10-06 DIAGNOSIS — N186 End stage renal disease: Secondary | ICD-10-CM | POA: Diagnosis not present

## 2013-10-06 DIAGNOSIS — N2581 Secondary hyperparathyroidism of renal origin: Secondary | ICD-10-CM | POA: Diagnosis not present

## 2013-10-07 DIAGNOSIS — N189 Chronic kidney disease, unspecified: Secondary | ICD-10-CM | POA: Diagnosis not present

## 2013-10-07 DIAGNOSIS — N281 Cyst of kidney, acquired: Secondary | ICD-10-CM | POA: Diagnosis not present

## 2013-10-07 DIAGNOSIS — R339 Retention of urine, unspecified: Secondary | ICD-10-CM | POA: Diagnosis not present

## 2013-10-07 DIAGNOSIS — R310 Gross hematuria: Secondary | ICD-10-CM | POA: Diagnosis not present

## 2013-10-08 DIAGNOSIS — N2581 Secondary hyperparathyroidism of renal origin: Secondary | ICD-10-CM | POA: Diagnosis not present

## 2013-10-08 DIAGNOSIS — D509 Iron deficiency anemia, unspecified: Secondary | ICD-10-CM | POA: Diagnosis not present

## 2013-10-08 DIAGNOSIS — N186 End stage renal disease: Secondary | ICD-10-CM | POA: Diagnosis not present

## 2013-10-11 DIAGNOSIS — N2581 Secondary hyperparathyroidism of renal origin: Secondary | ICD-10-CM | POA: Diagnosis not present

## 2013-10-11 DIAGNOSIS — N186 End stage renal disease: Secondary | ICD-10-CM | POA: Diagnosis not present

## 2013-10-11 DIAGNOSIS — D509 Iron deficiency anemia, unspecified: Secondary | ICD-10-CM | POA: Diagnosis not present

## 2013-10-12 DIAGNOSIS — L8990 Pressure ulcer of unspecified site, unspecified stage: Secondary | ICD-10-CM | POA: Diagnosis not present

## 2013-10-12 DIAGNOSIS — L89899 Pressure ulcer of other site, unspecified stage: Secondary | ICD-10-CM | POA: Diagnosis not present

## 2013-10-12 DIAGNOSIS — L8993 Pressure ulcer of unspecified site, stage 3: Secondary | ICD-10-CM | POA: Diagnosis not present

## 2013-10-13 DIAGNOSIS — N186 End stage renal disease: Secondary | ICD-10-CM | POA: Diagnosis not present

## 2013-10-13 DIAGNOSIS — N2581 Secondary hyperparathyroidism of renal origin: Secondary | ICD-10-CM | POA: Diagnosis not present

## 2013-10-13 DIAGNOSIS — D509 Iron deficiency anemia, unspecified: Secondary | ICD-10-CM | POA: Diagnosis not present

## 2013-10-15 DIAGNOSIS — N2581 Secondary hyperparathyroidism of renal origin: Secondary | ICD-10-CM | POA: Diagnosis not present

## 2013-10-15 DIAGNOSIS — D509 Iron deficiency anemia, unspecified: Secondary | ICD-10-CM | POA: Diagnosis not present

## 2013-10-15 DIAGNOSIS — N186 End stage renal disease: Secondary | ICD-10-CM | POA: Diagnosis not present

## 2013-10-18 DIAGNOSIS — D509 Iron deficiency anemia, unspecified: Secondary | ICD-10-CM | POA: Diagnosis not present

## 2013-10-18 DIAGNOSIS — N186 End stage renal disease: Secondary | ICD-10-CM | POA: Diagnosis not present

## 2013-10-18 DIAGNOSIS — N2581 Secondary hyperparathyroidism of renal origin: Secondary | ICD-10-CM | POA: Diagnosis not present

## 2013-10-20 DIAGNOSIS — N2581 Secondary hyperparathyroidism of renal origin: Secondary | ICD-10-CM | POA: Diagnosis not present

## 2013-10-20 DIAGNOSIS — D509 Iron deficiency anemia, unspecified: Secondary | ICD-10-CM | POA: Diagnosis not present

## 2013-10-20 DIAGNOSIS — N186 End stage renal disease: Secondary | ICD-10-CM | POA: Diagnosis not present

## 2013-10-22 DIAGNOSIS — D509 Iron deficiency anemia, unspecified: Secondary | ICD-10-CM | POA: Diagnosis not present

## 2013-10-22 DIAGNOSIS — N2581 Secondary hyperparathyroidism of renal origin: Secondary | ICD-10-CM | POA: Diagnosis not present

## 2013-10-22 DIAGNOSIS — N186 End stage renal disease: Secondary | ICD-10-CM | POA: Diagnosis not present

## 2013-10-25 DIAGNOSIS — N2581 Secondary hyperparathyroidism of renal origin: Secondary | ICD-10-CM | POA: Diagnosis not present

## 2013-10-25 DIAGNOSIS — D509 Iron deficiency anemia, unspecified: Secondary | ICD-10-CM | POA: Diagnosis not present

## 2013-10-25 DIAGNOSIS — N186 End stage renal disease: Secondary | ICD-10-CM | POA: Diagnosis not present

## 2013-10-27 DIAGNOSIS — N2581 Secondary hyperparathyroidism of renal origin: Secondary | ICD-10-CM | POA: Diagnosis not present

## 2013-10-27 DIAGNOSIS — D509 Iron deficiency anemia, unspecified: Secondary | ICD-10-CM | POA: Diagnosis not present

## 2013-10-27 DIAGNOSIS — N186 End stage renal disease: Secondary | ICD-10-CM | POA: Diagnosis not present

## 2013-11-03 DIAGNOSIS — N186 End stage renal disease: Secondary | ICD-10-CM | POA: Diagnosis not present

## 2013-11-18 DIAGNOSIS — I739 Peripheral vascular disease, unspecified: Secondary | ICD-10-CM | POA: Diagnosis not present

## 2013-11-18 DIAGNOSIS — Z872 Personal history of diseases of the skin and subcutaneous tissue: Secondary | ICD-10-CM | POA: Diagnosis not present

## 2013-11-18 DIAGNOSIS — G5790 Unspecified mononeuropathy of unspecified lower limb: Secondary | ICD-10-CM | POA: Diagnosis not present

## 2013-11-18 DIAGNOSIS — L98499 Non-pressure chronic ulcer of skin of other sites with unspecified severity: Secondary | ICD-10-CM | POA: Diagnosis not present

## 2013-11-25 DIAGNOSIS — Z23 Encounter for immunization: Secondary | ICD-10-CM | POA: Diagnosis not present

## 2013-11-25 DIAGNOSIS — N186 End stage renal disease: Secondary | ICD-10-CM | POA: Diagnosis not present

## 2013-11-25 DIAGNOSIS — I10 Essential (primary) hypertension: Secondary | ICD-10-CM | POA: Diagnosis not present

## 2013-11-26 DIAGNOSIS — N186 End stage renal disease: Secondary | ICD-10-CM | POA: Diagnosis not present

## 2013-11-29 DIAGNOSIS — D509 Iron deficiency anemia, unspecified: Secondary | ICD-10-CM | POA: Diagnosis not present

## 2013-11-29 DIAGNOSIS — N186 End stage renal disease: Secondary | ICD-10-CM | POA: Diagnosis not present

## 2013-11-29 DIAGNOSIS — N2581 Secondary hyperparathyroidism of renal origin: Secondary | ICD-10-CM | POA: Diagnosis not present

## 2013-12-01 DIAGNOSIS — N186 End stage renal disease: Secondary | ICD-10-CM | POA: Diagnosis not present

## 2013-12-01 DIAGNOSIS — N2581 Secondary hyperparathyroidism of renal origin: Secondary | ICD-10-CM | POA: Diagnosis not present

## 2013-12-01 DIAGNOSIS — D509 Iron deficiency anemia, unspecified: Secondary | ICD-10-CM | POA: Diagnosis not present

## 2013-12-03 DIAGNOSIS — D509 Iron deficiency anemia, unspecified: Secondary | ICD-10-CM | POA: Diagnosis not present

## 2013-12-03 DIAGNOSIS — N2581 Secondary hyperparathyroidism of renal origin: Secondary | ICD-10-CM | POA: Diagnosis not present

## 2013-12-03 DIAGNOSIS — N186 End stage renal disease: Secondary | ICD-10-CM | POA: Diagnosis not present

## 2013-12-06 DIAGNOSIS — D509 Iron deficiency anemia, unspecified: Secondary | ICD-10-CM | POA: Diagnosis not present

## 2013-12-06 DIAGNOSIS — N2581 Secondary hyperparathyroidism of renal origin: Secondary | ICD-10-CM | POA: Diagnosis not present

## 2013-12-06 DIAGNOSIS — N186 End stage renal disease: Secondary | ICD-10-CM | POA: Diagnosis not present

## 2013-12-08 DIAGNOSIS — N2581 Secondary hyperparathyroidism of renal origin: Secondary | ICD-10-CM | POA: Diagnosis not present

## 2013-12-08 DIAGNOSIS — D509 Iron deficiency anemia, unspecified: Secondary | ICD-10-CM | POA: Diagnosis not present

## 2013-12-08 DIAGNOSIS — N186 End stage renal disease: Secondary | ICD-10-CM | POA: Diagnosis not present

## 2013-12-09 DIAGNOSIS — I739 Peripheral vascular disease, unspecified: Secondary | ICD-10-CM | POA: Diagnosis not present

## 2013-12-09 DIAGNOSIS — L97509 Non-pressure chronic ulcer of other part of unspecified foot with unspecified severity: Secondary | ICD-10-CM | POA: Diagnosis not present

## 2013-12-09 DIAGNOSIS — L89899 Pressure ulcer of other site, unspecified stage: Secondary | ICD-10-CM | POA: Diagnosis not present

## 2013-12-09 DIAGNOSIS — L8994 Pressure ulcer of unspecified site, stage 4: Secondary | ICD-10-CM | POA: Diagnosis not present

## 2013-12-09 DIAGNOSIS — Z992 Dependence on renal dialysis: Secondary | ICD-10-CM | POA: Diagnosis not present

## 2013-12-10 DIAGNOSIS — N2581 Secondary hyperparathyroidism of renal origin: Secondary | ICD-10-CM | POA: Diagnosis not present

## 2013-12-10 DIAGNOSIS — N186 End stage renal disease: Secondary | ICD-10-CM | POA: Diagnosis not present

## 2013-12-10 DIAGNOSIS — D509 Iron deficiency anemia, unspecified: Secondary | ICD-10-CM | POA: Diagnosis not present

## 2013-12-13 DIAGNOSIS — D509 Iron deficiency anemia, unspecified: Secondary | ICD-10-CM | POA: Diagnosis not present

## 2013-12-13 DIAGNOSIS — N2581 Secondary hyperparathyroidism of renal origin: Secondary | ICD-10-CM | POA: Diagnosis not present

## 2013-12-13 DIAGNOSIS — N186 End stage renal disease: Secondary | ICD-10-CM | POA: Diagnosis not present

## 2013-12-15 DIAGNOSIS — D509 Iron deficiency anemia, unspecified: Secondary | ICD-10-CM | POA: Diagnosis not present

## 2013-12-15 DIAGNOSIS — N2581 Secondary hyperparathyroidism of renal origin: Secondary | ICD-10-CM | POA: Diagnosis not present

## 2013-12-15 DIAGNOSIS — F411 Generalized anxiety disorder: Secondary | ICD-10-CM | POA: Diagnosis not present

## 2013-12-15 DIAGNOSIS — J449 Chronic obstructive pulmonary disease, unspecified: Secondary | ICD-10-CM | POA: Diagnosis not present

## 2013-12-15 DIAGNOSIS — N19 Unspecified kidney failure: Secondary | ICD-10-CM | POA: Diagnosis not present

## 2013-12-15 DIAGNOSIS — N186 End stage renal disease: Secondary | ICD-10-CM | POA: Diagnosis not present

## 2013-12-16 DIAGNOSIS — L97509 Non-pressure chronic ulcer of other part of unspecified foot with unspecified severity: Secondary | ICD-10-CM | POA: Diagnosis not present

## 2013-12-16 DIAGNOSIS — I739 Peripheral vascular disease, unspecified: Secondary | ICD-10-CM | POA: Diagnosis not present

## 2013-12-16 DIAGNOSIS — Z992 Dependence on renal dialysis: Secondary | ICD-10-CM | POA: Diagnosis not present

## 2013-12-17 DIAGNOSIS — D509 Iron deficiency anemia, unspecified: Secondary | ICD-10-CM | POA: Diagnosis not present

## 2013-12-17 DIAGNOSIS — N186 End stage renal disease: Secondary | ICD-10-CM | POA: Diagnosis not present

## 2013-12-17 DIAGNOSIS — N2581 Secondary hyperparathyroidism of renal origin: Secondary | ICD-10-CM | POA: Diagnosis not present

## 2013-12-20 DIAGNOSIS — D509 Iron deficiency anemia, unspecified: Secondary | ICD-10-CM | POA: Diagnosis not present

## 2013-12-20 DIAGNOSIS — N186 End stage renal disease: Secondary | ICD-10-CM | POA: Diagnosis not present

## 2013-12-20 DIAGNOSIS — N2581 Secondary hyperparathyroidism of renal origin: Secondary | ICD-10-CM | POA: Diagnosis not present

## 2013-12-22 DIAGNOSIS — N186 End stage renal disease: Secondary | ICD-10-CM | POA: Diagnosis not present

## 2013-12-22 DIAGNOSIS — N2581 Secondary hyperparathyroidism of renal origin: Secondary | ICD-10-CM | POA: Diagnosis not present

## 2013-12-22 DIAGNOSIS — D509 Iron deficiency anemia, unspecified: Secondary | ICD-10-CM | POA: Diagnosis not present

## 2013-12-24 DIAGNOSIS — N2581 Secondary hyperparathyroidism of renal origin: Secondary | ICD-10-CM | POA: Diagnosis not present

## 2013-12-24 DIAGNOSIS — N186 End stage renal disease: Secondary | ICD-10-CM | POA: Diagnosis not present

## 2013-12-24 DIAGNOSIS — D509 Iron deficiency anemia, unspecified: Secondary | ICD-10-CM | POA: Diagnosis not present

## 2013-12-27 DIAGNOSIS — N2581 Secondary hyperparathyroidism of renal origin: Secondary | ICD-10-CM | POA: Diagnosis not present

## 2013-12-27 DIAGNOSIS — N186 End stage renal disease: Secondary | ICD-10-CM | POA: Diagnosis not present

## 2013-12-27 DIAGNOSIS — D509 Iron deficiency anemia, unspecified: Secondary | ICD-10-CM | POA: Diagnosis not present

## 2013-12-28 DIAGNOSIS — Z992 Dependence on renal dialysis: Secondary | ICD-10-CM | POA: Diagnosis not present

## 2013-12-28 DIAGNOSIS — N186 End stage renal disease: Secondary | ICD-10-CM | POA: Diagnosis not present

## 2013-12-28 DIAGNOSIS — L89899 Pressure ulcer of other site, unspecified stage: Secondary | ICD-10-CM | POA: Diagnosis not present

## 2013-12-28 DIAGNOSIS — L8992 Pressure ulcer of unspecified site, stage 2: Secondary | ICD-10-CM | POA: Diagnosis not present

## 2013-12-29 DIAGNOSIS — N2581 Secondary hyperparathyroidism of renal origin: Secondary | ICD-10-CM | POA: Diagnosis not present

## 2013-12-29 DIAGNOSIS — N186 End stage renal disease: Secondary | ICD-10-CM | POA: Diagnosis not present

## 2013-12-29 DIAGNOSIS — D509 Iron deficiency anemia, unspecified: Secondary | ICD-10-CM | POA: Diagnosis not present

## 2014-01-04 DIAGNOSIS — N186 End stage renal disease: Secondary | ICD-10-CM | POA: Diagnosis not present

## 2014-01-04 DIAGNOSIS — Z992 Dependence on renal dialysis: Secondary | ICD-10-CM | POA: Diagnosis not present

## 2014-01-04 DIAGNOSIS — L8992 Pressure ulcer of unspecified site, stage 2: Secondary | ICD-10-CM | POA: Diagnosis not present

## 2014-01-04 DIAGNOSIS — L8994 Pressure ulcer of unspecified site, stage 4: Secondary | ICD-10-CM | POA: Diagnosis not present

## 2014-01-04 DIAGNOSIS — L89899 Pressure ulcer of other site, unspecified stage: Secondary | ICD-10-CM | POA: Diagnosis not present

## 2014-01-10 DIAGNOSIS — N186 End stage renal disease: Secondary | ICD-10-CM | POA: Diagnosis not present

## 2014-01-26 DIAGNOSIS — N186 End stage renal disease: Secondary | ICD-10-CM | POA: Diagnosis not present

## 2014-01-28 DIAGNOSIS — N186 End stage renal disease: Secondary | ICD-10-CM | POA: Diagnosis not present

## 2014-01-28 DIAGNOSIS — Z23 Encounter for immunization: Secondary | ICD-10-CM | POA: Diagnosis not present

## 2014-01-28 DIAGNOSIS — Z992 Dependence on renal dialysis: Secondary | ICD-10-CM | POA: Diagnosis not present

## 2014-01-28 DIAGNOSIS — D509 Iron deficiency anemia, unspecified: Secondary | ICD-10-CM | POA: Diagnosis not present

## 2014-01-28 DIAGNOSIS — D631 Anemia in chronic kidney disease: Secondary | ICD-10-CM | POA: Diagnosis not present

## 2014-01-28 DIAGNOSIS — N2581 Secondary hyperparathyroidism of renal origin: Secondary | ICD-10-CM | POA: Diagnosis not present

## 2014-02-11 DIAGNOSIS — N186 End stage renal disease: Secondary | ICD-10-CM | POA: Diagnosis not present

## 2014-02-11 DIAGNOSIS — Z992 Dependence on renal dialysis: Secondary | ICD-10-CM | POA: Diagnosis not present

## 2014-02-24 DIAGNOSIS — M1000 Idiopathic gout, unspecified site: Secondary | ICD-10-CM | POA: Diagnosis not present

## 2014-02-24 DIAGNOSIS — N186 End stage renal disease: Secondary | ICD-10-CM | POA: Diagnosis not present

## 2014-02-24 DIAGNOSIS — I10 Essential (primary) hypertension: Secondary | ICD-10-CM | POA: Diagnosis not present

## 2014-02-24 DIAGNOSIS — Z23 Encounter for immunization: Secondary | ICD-10-CM | POA: Diagnosis not present

## 2014-02-26 DIAGNOSIS — N186 End stage renal disease: Secondary | ICD-10-CM | POA: Diagnosis not present

## 2014-02-26 DIAGNOSIS — Z992 Dependence on renal dialysis: Secondary | ICD-10-CM | POA: Diagnosis not present

## 2014-02-28 DIAGNOSIS — D509 Iron deficiency anemia, unspecified: Secondary | ICD-10-CM | POA: Diagnosis not present

## 2014-02-28 DIAGNOSIS — N186 End stage renal disease: Secondary | ICD-10-CM | POA: Diagnosis not present

## 2014-02-28 DIAGNOSIS — N2581 Secondary hyperparathyroidism of renal origin: Secondary | ICD-10-CM | POA: Diagnosis not present

## 2014-02-28 DIAGNOSIS — Z992 Dependence on renal dialysis: Secondary | ICD-10-CM | POA: Diagnosis not present

## 2014-03-10 DIAGNOSIS — Z720 Tobacco use: Secondary | ICD-10-CM | POA: Diagnosis not present

## 2014-03-10 DIAGNOSIS — I10 Essential (primary) hypertension: Secondary | ICD-10-CM | POA: Diagnosis not present

## 2014-03-10 DIAGNOSIS — G629 Polyneuropathy, unspecified: Secondary | ICD-10-CM | POA: Diagnosis not present

## 2014-03-10 DIAGNOSIS — I70245 Atherosclerosis of native arteries of left leg with ulceration of other part of foot: Secondary | ICD-10-CM | POA: Diagnosis not present

## 2014-03-10 DIAGNOSIS — L97529 Non-pressure chronic ulcer of other part of left foot with unspecified severity: Secondary | ICD-10-CM | POA: Diagnosis not present

## 2014-03-10 DIAGNOSIS — Z8673 Personal history of transient ischemic attack (TIA), and cerebral infarction without residual deficits: Secondary | ICD-10-CM | POA: Diagnosis not present

## 2014-03-10 DIAGNOSIS — R938 Abnormal findings on diagnostic imaging of other specified body structures: Secondary | ICD-10-CM | POA: Diagnosis not present

## 2014-03-10 DIAGNOSIS — L97829 Non-pressure chronic ulcer of other part of left lower leg with unspecified severity: Secondary | ICD-10-CM | POA: Diagnosis not present

## 2014-03-17 DIAGNOSIS — I10 Essential (primary) hypertension: Secondary | ICD-10-CM | POA: Diagnosis not present

## 2014-03-17 DIAGNOSIS — I70245 Atherosclerosis of native arteries of left leg with ulceration of other part of foot: Secondary | ICD-10-CM | POA: Diagnosis not present

## 2014-03-17 DIAGNOSIS — G629 Polyneuropathy, unspecified: Secondary | ICD-10-CM | POA: Diagnosis not present

## 2014-03-17 DIAGNOSIS — L97529 Non-pressure chronic ulcer of other part of left foot with unspecified severity: Secondary | ICD-10-CM | POA: Diagnosis not present

## 2014-03-17 DIAGNOSIS — L97829 Non-pressure chronic ulcer of other part of left lower leg with unspecified severity: Secondary | ICD-10-CM | POA: Diagnosis not present

## 2014-03-17 DIAGNOSIS — Z8673 Personal history of transient ischemic attack (TIA), and cerebral infarction without residual deficits: Secondary | ICD-10-CM | POA: Diagnosis not present

## 2014-03-17 DIAGNOSIS — Z720 Tobacco use: Secondary | ICD-10-CM | POA: Diagnosis not present

## 2014-03-28 DIAGNOSIS — Z992 Dependence on renal dialysis: Secondary | ICD-10-CM | POA: Diagnosis not present

## 2014-03-28 DIAGNOSIS — N186 End stage renal disease: Secondary | ICD-10-CM | POA: Diagnosis not present

## 2014-03-30 DIAGNOSIS — N186 End stage renal disease: Secondary | ICD-10-CM | POA: Diagnosis not present

## 2014-03-30 DIAGNOSIS — D509 Iron deficiency anemia, unspecified: Secondary | ICD-10-CM | POA: Diagnosis not present

## 2014-03-30 DIAGNOSIS — N2581 Secondary hyperparathyroidism of renal origin: Secondary | ICD-10-CM | POA: Diagnosis not present

## 2014-03-30 DIAGNOSIS — Z992 Dependence on renal dialysis: Secondary | ICD-10-CM | POA: Diagnosis not present

## 2014-04-01 DIAGNOSIS — N2581 Secondary hyperparathyroidism of renal origin: Secondary | ICD-10-CM | POA: Diagnosis not present

## 2014-04-01 DIAGNOSIS — Z992 Dependence on renal dialysis: Secondary | ICD-10-CM | POA: Diagnosis not present

## 2014-04-01 DIAGNOSIS — N186 End stage renal disease: Secondary | ICD-10-CM | POA: Diagnosis not present

## 2014-04-01 DIAGNOSIS — D509 Iron deficiency anemia, unspecified: Secondary | ICD-10-CM | POA: Diagnosis not present

## 2014-04-04 DIAGNOSIS — Z992 Dependence on renal dialysis: Secondary | ICD-10-CM | POA: Diagnosis not present

## 2014-04-04 DIAGNOSIS — N186 End stage renal disease: Secondary | ICD-10-CM | POA: Diagnosis not present

## 2014-04-04 DIAGNOSIS — D509 Iron deficiency anemia, unspecified: Secondary | ICD-10-CM | POA: Diagnosis not present

## 2014-04-04 DIAGNOSIS — N2581 Secondary hyperparathyroidism of renal origin: Secondary | ICD-10-CM | POA: Diagnosis not present

## 2014-04-06 DIAGNOSIS — D509 Iron deficiency anemia, unspecified: Secondary | ICD-10-CM | POA: Diagnosis not present

## 2014-04-06 DIAGNOSIS — N2581 Secondary hyperparathyroidism of renal origin: Secondary | ICD-10-CM | POA: Diagnosis not present

## 2014-04-06 DIAGNOSIS — Z992 Dependence on renal dialysis: Secondary | ICD-10-CM | POA: Diagnosis not present

## 2014-04-06 DIAGNOSIS — N186 End stage renal disease: Secondary | ICD-10-CM | POA: Diagnosis not present

## 2014-04-07 ENCOUNTER — Encounter (HOSPITAL_COMMUNITY): Payer: Self-pay | Admitting: Vascular Surgery

## 2014-04-08 DIAGNOSIS — D509 Iron deficiency anemia, unspecified: Secondary | ICD-10-CM | POA: Diagnosis not present

## 2014-04-08 DIAGNOSIS — N186 End stage renal disease: Secondary | ICD-10-CM | POA: Diagnosis not present

## 2014-04-08 DIAGNOSIS — Z992 Dependence on renal dialysis: Secondary | ICD-10-CM | POA: Diagnosis not present

## 2014-04-08 DIAGNOSIS — N2581 Secondary hyperparathyroidism of renal origin: Secondary | ICD-10-CM | POA: Diagnosis not present

## 2014-04-11 DIAGNOSIS — N186 End stage renal disease: Secondary | ICD-10-CM | POA: Diagnosis not present

## 2014-04-11 DIAGNOSIS — N2581 Secondary hyperparathyroidism of renal origin: Secondary | ICD-10-CM | POA: Diagnosis not present

## 2014-04-11 DIAGNOSIS — D509 Iron deficiency anemia, unspecified: Secondary | ICD-10-CM | POA: Diagnosis not present

## 2014-04-11 DIAGNOSIS — Z992 Dependence on renal dialysis: Secondary | ICD-10-CM | POA: Diagnosis not present

## 2014-04-13 DIAGNOSIS — N186 End stage renal disease: Secondary | ICD-10-CM | POA: Diagnosis not present

## 2014-04-13 DIAGNOSIS — Z992 Dependence on renal dialysis: Secondary | ICD-10-CM | POA: Diagnosis not present

## 2014-04-13 DIAGNOSIS — N2581 Secondary hyperparathyroidism of renal origin: Secondary | ICD-10-CM | POA: Diagnosis not present

## 2014-04-13 DIAGNOSIS — D509 Iron deficiency anemia, unspecified: Secondary | ICD-10-CM | POA: Diagnosis not present

## 2014-04-15 DIAGNOSIS — Z992 Dependence on renal dialysis: Secondary | ICD-10-CM | POA: Diagnosis not present

## 2014-04-15 DIAGNOSIS — D509 Iron deficiency anemia, unspecified: Secondary | ICD-10-CM | POA: Diagnosis not present

## 2014-04-15 DIAGNOSIS — N2581 Secondary hyperparathyroidism of renal origin: Secondary | ICD-10-CM | POA: Diagnosis not present

## 2014-04-15 DIAGNOSIS — N186 End stage renal disease: Secondary | ICD-10-CM | POA: Diagnosis not present

## 2014-04-18 DIAGNOSIS — N2581 Secondary hyperparathyroidism of renal origin: Secondary | ICD-10-CM | POA: Diagnosis not present

## 2014-04-18 DIAGNOSIS — N186 End stage renal disease: Secondary | ICD-10-CM | POA: Diagnosis not present

## 2014-04-18 DIAGNOSIS — Z992 Dependence on renal dialysis: Secondary | ICD-10-CM | POA: Diagnosis not present

## 2014-04-18 DIAGNOSIS — D509 Iron deficiency anemia, unspecified: Secondary | ICD-10-CM | POA: Diagnosis not present

## 2014-04-20 DIAGNOSIS — Z992 Dependence on renal dialysis: Secondary | ICD-10-CM | POA: Diagnosis not present

## 2014-04-20 DIAGNOSIS — N2581 Secondary hyperparathyroidism of renal origin: Secondary | ICD-10-CM | POA: Diagnosis not present

## 2014-04-20 DIAGNOSIS — D509 Iron deficiency anemia, unspecified: Secondary | ICD-10-CM | POA: Diagnosis not present

## 2014-04-20 DIAGNOSIS — N186 End stage renal disease: Secondary | ICD-10-CM | POA: Diagnosis not present

## 2014-04-23 DIAGNOSIS — Z992 Dependence on renal dialysis: Secondary | ICD-10-CM | POA: Diagnosis not present

## 2014-04-23 DIAGNOSIS — D509 Iron deficiency anemia, unspecified: Secondary | ICD-10-CM | POA: Diagnosis not present

## 2014-04-23 DIAGNOSIS — N2581 Secondary hyperparathyroidism of renal origin: Secondary | ICD-10-CM | POA: Diagnosis not present

## 2014-04-23 DIAGNOSIS — N186 End stage renal disease: Secondary | ICD-10-CM | POA: Diagnosis not present

## 2014-04-25 DIAGNOSIS — D509 Iron deficiency anemia, unspecified: Secondary | ICD-10-CM | POA: Diagnosis not present

## 2014-04-25 DIAGNOSIS — N2581 Secondary hyperparathyroidism of renal origin: Secondary | ICD-10-CM | POA: Diagnosis not present

## 2014-04-25 DIAGNOSIS — Z992 Dependence on renal dialysis: Secondary | ICD-10-CM | POA: Diagnosis not present

## 2014-04-25 DIAGNOSIS — N186 End stage renal disease: Secondary | ICD-10-CM | POA: Diagnosis not present

## 2014-04-27 DIAGNOSIS — N2581 Secondary hyperparathyroidism of renal origin: Secondary | ICD-10-CM | POA: Diagnosis not present

## 2014-04-27 DIAGNOSIS — D509 Iron deficiency anemia, unspecified: Secondary | ICD-10-CM | POA: Diagnosis not present

## 2014-04-27 DIAGNOSIS — Z992 Dependence on renal dialysis: Secondary | ICD-10-CM | POA: Diagnosis not present

## 2014-04-27 DIAGNOSIS — N186 End stage renal disease: Secondary | ICD-10-CM | POA: Diagnosis not present

## 2014-04-28 DIAGNOSIS — Z992 Dependence on renal dialysis: Secondary | ICD-10-CM | POA: Diagnosis not present

## 2014-04-28 DIAGNOSIS — N186 End stage renal disease: Secondary | ICD-10-CM | POA: Diagnosis not present

## 2014-04-29 DIAGNOSIS — N2581 Secondary hyperparathyroidism of renal origin: Secondary | ICD-10-CM | POA: Diagnosis not present

## 2014-04-29 DIAGNOSIS — M7989 Other specified soft tissue disorders: Secondary | ICD-10-CM | POA: Diagnosis not present

## 2014-04-29 DIAGNOSIS — D509 Iron deficiency anemia, unspecified: Secondary | ICD-10-CM | POA: Diagnosis not present

## 2014-04-29 DIAGNOSIS — Z992 Dependence on renal dialysis: Secondary | ICD-10-CM | POA: Diagnosis not present

## 2014-04-29 DIAGNOSIS — N186 End stage renal disease: Secondary | ICD-10-CM | POA: Diagnosis not present

## 2014-05-02 DIAGNOSIS — D509 Iron deficiency anemia, unspecified: Secondary | ICD-10-CM | POA: Diagnosis not present

## 2014-05-02 DIAGNOSIS — Z992 Dependence on renal dialysis: Secondary | ICD-10-CM | POA: Diagnosis not present

## 2014-05-02 DIAGNOSIS — M7989 Other specified soft tissue disorders: Secondary | ICD-10-CM | POA: Diagnosis not present

## 2014-05-02 DIAGNOSIS — N2581 Secondary hyperparathyroidism of renal origin: Secondary | ICD-10-CM | POA: Diagnosis not present

## 2014-05-02 DIAGNOSIS — N186 End stage renal disease: Secondary | ICD-10-CM | POA: Diagnosis not present

## 2014-05-04 DIAGNOSIS — D509 Iron deficiency anemia, unspecified: Secondary | ICD-10-CM | POA: Diagnosis not present

## 2014-05-04 DIAGNOSIS — M7989 Other specified soft tissue disorders: Secondary | ICD-10-CM | POA: Diagnosis not present

## 2014-05-04 DIAGNOSIS — N186 End stage renal disease: Secondary | ICD-10-CM | POA: Diagnosis not present

## 2014-05-04 DIAGNOSIS — N2581 Secondary hyperparathyroidism of renal origin: Secondary | ICD-10-CM | POA: Diagnosis not present

## 2014-05-04 DIAGNOSIS — Z992 Dependence on renal dialysis: Secondary | ICD-10-CM | POA: Diagnosis not present

## 2014-05-06 DIAGNOSIS — N2581 Secondary hyperparathyroidism of renal origin: Secondary | ICD-10-CM | POA: Diagnosis not present

## 2014-05-06 DIAGNOSIS — Z992 Dependence on renal dialysis: Secondary | ICD-10-CM | POA: Diagnosis not present

## 2014-05-06 DIAGNOSIS — M7989 Other specified soft tissue disorders: Secondary | ICD-10-CM | POA: Diagnosis not present

## 2014-05-06 DIAGNOSIS — D509 Iron deficiency anemia, unspecified: Secondary | ICD-10-CM | POA: Diagnosis not present

## 2014-05-06 DIAGNOSIS — N186 End stage renal disease: Secondary | ICD-10-CM | POA: Diagnosis not present

## 2014-05-09 DIAGNOSIS — N186 End stage renal disease: Secondary | ICD-10-CM | POA: Diagnosis not present

## 2014-05-09 DIAGNOSIS — N2581 Secondary hyperparathyroidism of renal origin: Secondary | ICD-10-CM | POA: Diagnosis not present

## 2014-05-09 DIAGNOSIS — M7989 Other specified soft tissue disorders: Secondary | ICD-10-CM | POA: Diagnosis not present

## 2014-05-09 DIAGNOSIS — Z992 Dependence on renal dialysis: Secondary | ICD-10-CM | POA: Diagnosis not present

## 2014-05-09 DIAGNOSIS — D509 Iron deficiency anemia, unspecified: Secondary | ICD-10-CM | POA: Diagnosis not present

## 2014-05-11 DIAGNOSIS — N186 End stage renal disease: Secondary | ICD-10-CM | POA: Diagnosis not present

## 2014-05-11 DIAGNOSIS — D509 Iron deficiency anemia, unspecified: Secondary | ICD-10-CM | POA: Diagnosis not present

## 2014-05-11 DIAGNOSIS — M7989 Other specified soft tissue disorders: Secondary | ICD-10-CM | POA: Diagnosis not present

## 2014-05-11 DIAGNOSIS — Z992 Dependence on renal dialysis: Secondary | ICD-10-CM | POA: Diagnosis not present

## 2014-05-11 DIAGNOSIS — N2581 Secondary hyperparathyroidism of renal origin: Secondary | ICD-10-CM | POA: Diagnosis not present

## 2014-05-13 DIAGNOSIS — D509 Iron deficiency anemia, unspecified: Secondary | ICD-10-CM | POA: Diagnosis not present

## 2014-05-13 DIAGNOSIS — N186 End stage renal disease: Secondary | ICD-10-CM | POA: Diagnosis not present

## 2014-05-13 DIAGNOSIS — Z992 Dependence on renal dialysis: Secondary | ICD-10-CM | POA: Diagnosis not present

## 2014-05-13 DIAGNOSIS — N2581 Secondary hyperparathyroidism of renal origin: Secondary | ICD-10-CM | POA: Diagnosis not present

## 2014-05-13 DIAGNOSIS — M7989 Other specified soft tissue disorders: Secondary | ICD-10-CM | POA: Diagnosis not present

## 2014-05-16 DIAGNOSIS — D509 Iron deficiency anemia, unspecified: Secondary | ICD-10-CM | POA: Diagnosis not present

## 2014-05-16 DIAGNOSIS — N2581 Secondary hyperparathyroidism of renal origin: Secondary | ICD-10-CM | POA: Diagnosis not present

## 2014-05-16 DIAGNOSIS — N186 End stage renal disease: Secondary | ICD-10-CM | POA: Diagnosis not present

## 2014-05-16 DIAGNOSIS — M7989 Other specified soft tissue disorders: Secondary | ICD-10-CM | POA: Diagnosis not present

## 2014-05-16 DIAGNOSIS — Z992 Dependence on renal dialysis: Secondary | ICD-10-CM | POA: Diagnosis not present

## 2014-05-18 DIAGNOSIS — N186 End stage renal disease: Secondary | ICD-10-CM | POA: Diagnosis not present

## 2014-05-18 DIAGNOSIS — D509 Iron deficiency anemia, unspecified: Secondary | ICD-10-CM | POA: Diagnosis not present

## 2014-05-18 DIAGNOSIS — M7989 Other specified soft tissue disorders: Secondary | ICD-10-CM | POA: Diagnosis not present

## 2014-05-18 DIAGNOSIS — N2581 Secondary hyperparathyroidism of renal origin: Secondary | ICD-10-CM | POA: Diagnosis not present

## 2014-05-18 DIAGNOSIS — Z992 Dependence on renal dialysis: Secondary | ICD-10-CM | POA: Diagnosis not present

## 2014-05-19 DIAGNOSIS — M109 Gout, unspecified: Secondary | ICD-10-CM | POA: Diagnosis not present

## 2014-05-19 DIAGNOSIS — K219 Gastro-esophageal reflux disease without esophagitis: Secondary | ICD-10-CM | POA: Diagnosis not present

## 2014-05-19 DIAGNOSIS — N186 End stage renal disease: Secondary | ICD-10-CM | POA: Diagnosis not present

## 2014-05-19 DIAGNOSIS — I10 Essential (primary) hypertension: Secondary | ICD-10-CM | POA: Diagnosis not present

## 2014-05-20 DIAGNOSIS — D509 Iron deficiency anemia, unspecified: Secondary | ICD-10-CM | POA: Diagnosis not present

## 2014-05-20 DIAGNOSIS — N2581 Secondary hyperparathyroidism of renal origin: Secondary | ICD-10-CM | POA: Diagnosis not present

## 2014-05-20 DIAGNOSIS — N186 End stage renal disease: Secondary | ICD-10-CM | POA: Diagnosis not present

## 2014-05-20 DIAGNOSIS — Z992 Dependence on renal dialysis: Secondary | ICD-10-CM | POA: Diagnosis not present

## 2014-05-20 DIAGNOSIS — M7989 Other specified soft tissue disorders: Secondary | ICD-10-CM | POA: Diagnosis not present

## 2014-05-23 DIAGNOSIS — N186 End stage renal disease: Secondary | ICD-10-CM | POA: Diagnosis not present

## 2014-05-23 DIAGNOSIS — M7989 Other specified soft tissue disorders: Secondary | ICD-10-CM | POA: Diagnosis not present

## 2014-05-23 DIAGNOSIS — Z992 Dependence on renal dialysis: Secondary | ICD-10-CM | POA: Diagnosis not present

## 2014-05-23 DIAGNOSIS — D509 Iron deficiency anemia, unspecified: Secondary | ICD-10-CM | POA: Diagnosis not present

## 2014-05-23 DIAGNOSIS — N2581 Secondary hyperparathyroidism of renal origin: Secondary | ICD-10-CM | POA: Diagnosis not present

## 2014-05-24 DIAGNOSIS — I70245 Atherosclerosis of native arteries of left leg with ulceration of other part of foot: Secondary | ICD-10-CM | POA: Diagnosis not present

## 2014-05-24 DIAGNOSIS — L97529 Non-pressure chronic ulcer of other part of left foot with unspecified severity: Secondary | ICD-10-CM | POA: Diagnosis not present

## 2014-05-24 DIAGNOSIS — R233 Spontaneous ecchymoses: Secondary | ICD-10-CM | POA: Diagnosis not present

## 2014-05-24 DIAGNOSIS — L84 Corns and callosities: Secondary | ICD-10-CM | POA: Diagnosis not present

## 2014-05-24 DIAGNOSIS — T23211A Burn of second degree of right thumb (nail), initial encounter: Secondary | ICD-10-CM | POA: Diagnosis not present

## 2014-05-24 DIAGNOSIS — Z992 Dependence on renal dialysis: Secondary | ICD-10-CM | POA: Diagnosis not present

## 2014-05-24 DIAGNOSIS — T82848A Pain from vascular prosthetic devices, implants and grafts, initial encounter: Secondary | ICD-10-CM | POA: Diagnosis not present

## 2014-05-25 DIAGNOSIS — M7989 Other specified soft tissue disorders: Secondary | ICD-10-CM | POA: Diagnosis not present

## 2014-05-25 DIAGNOSIS — N186 End stage renal disease: Secondary | ICD-10-CM | POA: Diagnosis not present

## 2014-05-25 DIAGNOSIS — N2581 Secondary hyperparathyroidism of renal origin: Secondary | ICD-10-CM | POA: Diagnosis not present

## 2014-05-25 DIAGNOSIS — Z992 Dependence on renal dialysis: Secondary | ICD-10-CM | POA: Diagnosis not present

## 2014-05-25 DIAGNOSIS — D509 Iron deficiency anemia, unspecified: Secondary | ICD-10-CM | POA: Diagnosis not present

## 2014-05-27 DIAGNOSIS — Z992 Dependence on renal dialysis: Secondary | ICD-10-CM | POA: Diagnosis not present

## 2014-05-27 DIAGNOSIS — M7989 Other specified soft tissue disorders: Secondary | ICD-10-CM | POA: Diagnosis not present

## 2014-05-27 DIAGNOSIS — N2581 Secondary hyperparathyroidism of renal origin: Secondary | ICD-10-CM | POA: Diagnosis not present

## 2014-05-27 DIAGNOSIS — N186 End stage renal disease: Secondary | ICD-10-CM | POA: Diagnosis not present

## 2014-05-27 DIAGNOSIS — D509 Iron deficiency anemia, unspecified: Secondary | ICD-10-CM | POA: Diagnosis not present

## 2014-05-29 DIAGNOSIS — Z992 Dependence on renal dialysis: Secondary | ICD-10-CM | POA: Diagnosis not present

## 2014-05-29 DIAGNOSIS — N186 End stage renal disease: Secondary | ICD-10-CM | POA: Diagnosis not present

## 2014-05-30 DIAGNOSIS — Z992 Dependence on renal dialysis: Secondary | ICD-10-CM | POA: Diagnosis not present

## 2014-05-30 DIAGNOSIS — D509 Iron deficiency anemia, unspecified: Secondary | ICD-10-CM | POA: Diagnosis not present

## 2014-05-30 DIAGNOSIS — N2581 Secondary hyperparathyroidism of renal origin: Secondary | ICD-10-CM | POA: Diagnosis not present

## 2014-05-30 DIAGNOSIS — N186 End stage renal disease: Secondary | ICD-10-CM | POA: Diagnosis not present

## 2014-05-31 DIAGNOSIS — Z992 Dependence on renal dialysis: Secondary | ICD-10-CM | POA: Diagnosis not present

## 2014-05-31 DIAGNOSIS — L84 Corns and callosities: Secondary | ICD-10-CM | POA: Diagnosis not present

## 2014-05-31 DIAGNOSIS — T23211A Burn of second degree of right thumb (nail), initial encounter: Secondary | ICD-10-CM | POA: Diagnosis not present

## 2014-05-31 DIAGNOSIS — T23211D Burn of second degree of right thumb (nail), subsequent encounter: Secondary | ICD-10-CM | POA: Diagnosis not present

## 2014-06-01 DIAGNOSIS — N2581 Secondary hyperparathyroidism of renal origin: Secondary | ICD-10-CM | POA: Diagnosis not present

## 2014-06-01 DIAGNOSIS — G629 Polyneuropathy, unspecified: Secondary | ICD-10-CM | POA: Diagnosis not present

## 2014-06-01 DIAGNOSIS — L851 Acquired keratosis [keratoderma] palmaris et plantaris: Secondary | ICD-10-CM | POA: Diagnosis not present

## 2014-06-01 DIAGNOSIS — L97522 Non-pressure chronic ulcer of other part of left foot with fat layer exposed: Secondary | ICD-10-CM | POA: Diagnosis not present

## 2014-06-01 DIAGNOSIS — Z992 Dependence on renal dialysis: Secondary | ICD-10-CM | POA: Diagnosis not present

## 2014-06-01 DIAGNOSIS — N186 End stage renal disease: Secondary | ICD-10-CM | POA: Diagnosis not present

## 2014-06-01 DIAGNOSIS — D509 Iron deficiency anemia, unspecified: Secondary | ICD-10-CM | POA: Diagnosis not present

## 2014-06-01 DIAGNOSIS — B351 Tinea unguium: Secondary | ICD-10-CM | POA: Diagnosis not present

## 2014-06-02 DIAGNOSIS — Z992 Dependence on renal dialysis: Secondary | ICD-10-CM | POA: Diagnosis not present

## 2014-06-02 DIAGNOSIS — L97522 Non-pressure chronic ulcer of other part of left foot with fat layer exposed: Secondary | ICD-10-CM | POA: Diagnosis not present

## 2014-06-02 DIAGNOSIS — T23211A Burn of second degree of right thumb (nail), initial encounter: Secondary | ICD-10-CM | POA: Diagnosis not present

## 2014-06-02 DIAGNOSIS — I70245 Atherosclerosis of native arteries of left leg with ulceration of other part of foot: Secondary | ICD-10-CM | POA: Diagnosis not present

## 2014-06-02 DIAGNOSIS — L84 Corns and callosities: Secondary | ICD-10-CM | POA: Diagnosis not present

## 2014-06-03 DIAGNOSIS — D509 Iron deficiency anemia, unspecified: Secondary | ICD-10-CM | POA: Diagnosis not present

## 2014-06-03 DIAGNOSIS — N2581 Secondary hyperparathyroidism of renal origin: Secondary | ICD-10-CM | POA: Diagnosis not present

## 2014-06-03 DIAGNOSIS — N186 End stage renal disease: Secondary | ICD-10-CM | POA: Diagnosis not present

## 2014-06-03 DIAGNOSIS — Z992 Dependence on renal dialysis: Secondary | ICD-10-CM | POA: Diagnosis not present

## 2014-06-06 DIAGNOSIS — N2581 Secondary hyperparathyroidism of renal origin: Secondary | ICD-10-CM | POA: Diagnosis not present

## 2014-06-06 DIAGNOSIS — N186 End stage renal disease: Secondary | ICD-10-CM | POA: Diagnosis not present

## 2014-06-06 DIAGNOSIS — D509 Iron deficiency anemia, unspecified: Secondary | ICD-10-CM | POA: Diagnosis not present

## 2014-06-06 DIAGNOSIS — Z992 Dependence on renal dialysis: Secondary | ICD-10-CM | POA: Diagnosis not present

## 2014-06-08 DIAGNOSIS — Z992 Dependence on renal dialysis: Secondary | ICD-10-CM | POA: Diagnosis not present

## 2014-06-08 DIAGNOSIS — N2581 Secondary hyperparathyroidism of renal origin: Secondary | ICD-10-CM | POA: Diagnosis not present

## 2014-06-08 DIAGNOSIS — D509 Iron deficiency anemia, unspecified: Secondary | ICD-10-CM | POA: Diagnosis not present

## 2014-06-08 DIAGNOSIS — N186 End stage renal disease: Secondary | ICD-10-CM | POA: Diagnosis not present

## 2014-06-10 DIAGNOSIS — D509 Iron deficiency anemia, unspecified: Secondary | ICD-10-CM | POA: Diagnosis not present

## 2014-06-10 DIAGNOSIS — N186 End stage renal disease: Secondary | ICD-10-CM | POA: Diagnosis not present

## 2014-06-10 DIAGNOSIS — N2581 Secondary hyperparathyroidism of renal origin: Secondary | ICD-10-CM | POA: Diagnosis not present

## 2014-06-10 DIAGNOSIS — Z992 Dependence on renal dialysis: Secondary | ICD-10-CM | POA: Diagnosis not present

## 2014-06-13 DIAGNOSIS — N2581 Secondary hyperparathyroidism of renal origin: Secondary | ICD-10-CM | POA: Diagnosis not present

## 2014-06-13 DIAGNOSIS — Z992 Dependence on renal dialysis: Secondary | ICD-10-CM | POA: Diagnosis not present

## 2014-06-13 DIAGNOSIS — N186 End stage renal disease: Secondary | ICD-10-CM | POA: Diagnosis not present

## 2014-06-13 DIAGNOSIS — D509 Iron deficiency anemia, unspecified: Secondary | ICD-10-CM | POA: Diagnosis not present

## 2014-06-14 DIAGNOSIS — L84 Corns and callosities: Secondary | ICD-10-CM | POA: Diagnosis not present

## 2014-06-14 DIAGNOSIS — T23211A Burn of second degree of right thumb (nail), initial encounter: Secondary | ICD-10-CM | POA: Diagnosis not present

## 2014-06-14 DIAGNOSIS — Z992 Dependence on renal dialysis: Secondary | ICD-10-CM | POA: Diagnosis not present

## 2014-06-15 DIAGNOSIS — D509 Iron deficiency anemia, unspecified: Secondary | ICD-10-CM | POA: Diagnosis not present

## 2014-06-15 DIAGNOSIS — N2581 Secondary hyperparathyroidism of renal origin: Secondary | ICD-10-CM | POA: Diagnosis not present

## 2014-06-15 DIAGNOSIS — N186 End stage renal disease: Secondary | ICD-10-CM | POA: Diagnosis not present

## 2014-06-15 DIAGNOSIS — Z992 Dependence on renal dialysis: Secondary | ICD-10-CM | POA: Diagnosis not present

## 2014-06-17 DIAGNOSIS — N186 End stage renal disease: Secondary | ICD-10-CM | POA: Diagnosis not present

## 2014-06-17 DIAGNOSIS — Z992 Dependence on renal dialysis: Secondary | ICD-10-CM | POA: Diagnosis not present

## 2014-06-17 DIAGNOSIS — N2581 Secondary hyperparathyroidism of renal origin: Secondary | ICD-10-CM | POA: Diagnosis not present

## 2014-06-17 DIAGNOSIS — D509 Iron deficiency anemia, unspecified: Secondary | ICD-10-CM | POA: Diagnosis not present

## 2014-06-20 DIAGNOSIS — Z992 Dependence on renal dialysis: Secondary | ICD-10-CM | POA: Diagnosis not present

## 2014-06-20 DIAGNOSIS — D509 Iron deficiency anemia, unspecified: Secondary | ICD-10-CM | POA: Diagnosis not present

## 2014-06-20 DIAGNOSIS — N186 End stage renal disease: Secondary | ICD-10-CM | POA: Diagnosis not present

## 2014-06-20 DIAGNOSIS — N2581 Secondary hyperparathyroidism of renal origin: Secondary | ICD-10-CM | POA: Diagnosis not present

## 2014-06-22 DIAGNOSIS — N2581 Secondary hyperparathyroidism of renal origin: Secondary | ICD-10-CM | POA: Diagnosis not present

## 2014-06-22 DIAGNOSIS — D509 Iron deficiency anemia, unspecified: Secondary | ICD-10-CM | POA: Diagnosis not present

## 2014-06-22 DIAGNOSIS — N186 End stage renal disease: Secondary | ICD-10-CM | POA: Diagnosis not present

## 2014-06-22 DIAGNOSIS — Z992 Dependence on renal dialysis: Secondary | ICD-10-CM | POA: Diagnosis not present

## 2014-06-24 DIAGNOSIS — N2581 Secondary hyperparathyroidism of renal origin: Secondary | ICD-10-CM | POA: Diagnosis not present

## 2014-06-24 DIAGNOSIS — D509 Iron deficiency anemia, unspecified: Secondary | ICD-10-CM | POA: Diagnosis not present

## 2014-06-24 DIAGNOSIS — N186 End stage renal disease: Secondary | ICD-10-CM | POA: Diagnosis not present

## 2014-06-24 DIAGNOSIS — Z992 Dependence on renal dialysis: Secondary | ICD-10-CM | POA: Diagnosis not present

## 2014-06-27 DIAGNOSIS — N186 End stage renal disease: Secondary | ICD-10-CM | POA: Diagnosis not present

## 2014-06-27 DIAGNOSIS — N2581 Secondary hyperparathyroidism of renal origin: Secondary | ICD-10-CM | POA: Diagnosis not present

## 2014-06-27 DIAGNOSIS — Z992 Dependence on renal dialysis: Secondary | ICD-10-CM | POA: Diagnosis not present

## 2014-06-27 DIAGNOSIS — D509 Iron deficiency anemia, unspecified: Secondary | ICD-10-CM | POA: Diagnosis not present

## 2014-06-29 DIAGNOSIS — N186 End stage renal disease: Secondary | ICD-10-CM | POA: Diagnosis not present

## 2014-06-29 DIAGNOSIS — D509 Iron deficiency anemia, unspecified: Secondary | ICD-10-CM | POA: Diagnosis not present

## 2014-06-29 DIAGNOSIS — N2581 Secondary hyperparathyroidism of renal origin: Secondary | ICD-10-CM | POA: Diagnosis not present

## 2014-06-29 DIAGNOSIS — Z992 Dependence on renal dialysis: Secondary | ICD-10-CM | POA: Diagnosis not present

## 2014-06-29 DIAGNOSIS — L298 Other pruritus: Secondary | ICD-10-CM | POA: Diagnosis not present

## 2014-06-30 DIAGNOSIS — N186 End stage renal disease: Secondary | ICD-10-CM | POA: Diagnosis not present

## 2014-06-30 DIAGNOSIS — Z992 Dependence on renal dialysis: Secondary | ICD-10-CM | POA: Diagnosis not present

## 2014-06-30 DIAGNOSIS — T82858D Stenosis of vascular prosthetic devices, implants and grafts, subsequent encounter: Secondary | ICD-10-CM | POA: Diagnosis not present

## 2014-06-30 DIAGNOSIS — I871 Compression of vein: Secondary | ICD-10-CM | POA: Diagnosis not present

## 2014-07-01 DIAGNOSIS — Z992 Dependence on renal dialysis: Secondary | ICD-10-CM | POA: Diagnosis not present

## 2014-07-01 DIAGNOSIS — L298 Other pruritus: Secondary | ICD-10-CM | POA: Diagnosis not present

## 2014-07-01 DIAGNOSIS — D509 Iron deficiency anemia, unspecified: Secondary | ICD-10-CM | POA: Diagnosis not present

## 2014-07-01 DIAGNOSIS — N186 End stage renal disease: Secondary | ICD-10-CM | POA: Diagnosis not present

## 2014-07-01 DIAGNOSIS — N2581 Secondary hyperparathyroidism of renal origin: Secondary | ICD-10-CM | POA: Diagnosis not present

## 2014-07-04 DIAGNOSIS — N2581 Secondary hyperparathyroidism of renal origin: Secondary | ICD-10-CM | POA: Diagnosis not present

## 2014-07-04 DIAGNOSIS — Z992 Dependence on renal dialysis: Secondary | ICD-10-CM | POA: Diagnosis not present

## 2014-07-04 DIAGNOSIS — D509 Iron deficiency anemia, unspecified: Secondary | ICD-10-CM | POA: Diagnosis not present

## 2014-07-04 DIAGNOSIS — N186 End stage renal disease: Secondary | ICD-10-CM | POA: Diagnosis not present

## 2014-07-04 DIAGNOSIS — L298 Other pruritus: Secondary | ICD-10-CM | POA: Diagnosis not present

## 2014-07-06 DIAGNOSIS — D509 Iron deficiency anemia, unspecified: Secondary | ICD-10-CM | POA: Diagnosis not present

## 2014-07-06 DIAGNOSIS — N2581 Secondary hyperparathyroidism of renal origin: Secondary | ICD-10-CM | POA: Diagnosis not present

## 2014-07-06 DIAGNOSIS — Z992 Dependence on renal dialysis: Secondary | ICD-10-CM | POA: Diagnosis not present

## 2014-07-06 DIAGNOSIS — N186 End stage renal disease: Secondary | ICD-10-CM | POA: Diagnosis not present

## 2014-07-06 DIAGNOSIS — L298 Other pruritus: Secondary | ICD-10-CM | POA: Diagnosis not present

## 2014-07-08 DIAGNOSIS — N2581 Secondary hyperparathyroidism of renal origin: Secondary | ICD-10-CM | POA: Diagnosis not present

## 2014-07-08 DIAGNOSIS — Z992 Dependence on renal dialysis: Secondary | ICD-10-CM | POA: Diagnosis not present

## 2014-07-08 DIAGNOSIS — L298 Other pruritus: Secondary | ICD-10-CM | POA: Diagnosis not present

## 2014-07-08 DIAGNOSIS — D509 Iron deficiency anemia, unspecified: Secondary | ICD-10-CM | POA: Diagnosis not present

## 2014-07-08 DIAGNOSIS — N186 End stage renal disease: Secondary | ICD-10-CM | POA: Diagnosis not present

## 2014-07-11 DIAGNOSIS — D509 Iron deficiency anemia, unspecified: Secondary | ICD-10-CM | POA: Diagnosis not present

## 2014-07-11 DIAGNOSIS — Z992 Dependence on renal dialysis: Secondary | ICD-10-CM | POA: Diagnosis not present

## 2014-07-11 DIAGNOSIS — N186 End stage renal disease: Secondary | ICD-10-CM | POA: Diagnosis not present

## 2014-07-11 DIAGNOSIS — L298 Other pruritus: Secondary | ICD-10-CM | POA: Diagnosis not present

## 2014-07-11 DIAGNOSIS — N2581 Secondary hyperparathyroidism of renal origin: Secondary | ICD-10-CM | POA: Diagnosis not present

## 2014-07-13 DIAGNOSIS — L298 Other pruritus: Secondary | ICD-10-CM | POA: Diagnosis not present

## 2014-07-13 DIAGNOSIS — N2581 Secondary hyperparathyroidism of renal origin: Secondary | ICD-10-CM | POA: Diagnosis not present

## 2014-07-13 DIAGNOSIS — N186 End stage renal disease: Secondary | ICD-10-CM | POA: Diagnosis not present

## 2014-07-13 DIAGNOSIS — Z992 Dependence on renal dialysis: Secondary | ICD-10-CM | POA: Diagnosis not present

## 2014-07-13 DIAGNOSIS — D509 Iron deficiency anemia, unspecified: Secondary | ICD-10-CM | POA: Diagnosis not present

## 2014-07-15 DIAGNOSIS — L298 Other pruritus: Secondary | ICD-10-CM | POA: Diagnosis not present

## 2014-07-15 DIAGNOSIS — D509 Iron deficiency anemia, unspecified: Secondary | ICD-10-CM | POA: Diagnosis not present

## 2014-07-15 DIAGNOSIS — Z992 Dependence on renal dialysis: Secondary | ICD-10-CM | POA: Diagnosis not present

## 2014-07-15 DIAGNOSIS — N186 End stage renal disease: Secondary | ICD-10-CM | POA: Diagnosis not present

## 2014-07-15 DIAGNOSIS — N2581 Secondary hyperparathyroidism of renal origin: Secondary | ICD-10-CM | POA: Diagnosis not present

## 2014-07-18 DIAGNOSIS — N186 End stage renal disease: Secondary | ICD-10-CM | POA: Diagnosis not present

## 2014-07-18 DIAGNOSIS — Z992 Dependence on renal dialysis: Secondary | ICD-10-CM | POA: Diagnosis not present

## 2014-07-18 DIAGNOSIS — N2581 Secondary hyperparathyroidism of renal origin: Secondary | ICD-10-CM | POA: Diagnosis not present

## 2014-07-18 DIAGNOSIS — L298 Other pruritus: Secondary | ICD-10-CM | POA: Diagnosis not present

## 2014-07-18 DIAGNOSIS — D509 Iron deficiency anemia, unspecified: Secondary | ICD-10-CM | POA: Diagnosis not present

## 2014-07-20 DIAGNOSIS — L298 Other pruritus: Secondary | ICD-10-CM | POA: Diagnosis not present

## 2014-07-20 DIAGNOSIS — Z992 Dependence on renal dialysis: Secondary | ICD-10-CM | POA: Diagnosis not present

## 2014-07-20 DIAGNOSIS — N2581 Secondary hyperparathyroidism of renal origin: Secondary | ICD-10-CM | POA: Diagnosis not present

## 2014-07-20 DIAGNOSIS — D509 Iron deficiency anemia, unspecified: Secondary | ICD-10-CM | POA: Diagnosis not present

## 2014-07-20 DIAGNOSIS — N186 End stage renal disease: Secondary | ICD-10-CM | POA: Diagnosis not present

## 2014-07-22 DIAGNOSIS — D509 Iron deficiency anemia, unspecified: Secondary | ICD-10-CM | POA: Diagnosis not present

## 2014-07-22 DIAGNOSIS — L298 Other pruritus: Secondary | ICD-10-CM | POA: Diagnosis not present

## 2014-07-22 DIAGNOSIS — Z992 Dependence on renal dialysis: Secondary | ICD-10-CM | POA: Diagnosis not present

## 2014-07-22 DIAGNOSIS — N2581 Secondary hyperparathyroidism of renal origin: Secondary | ICD-10-CM | POA: Diagnosis not present

## 2014-07-22 DIAGNOSIS — N186 End stage renal disease: Secondary | ICD-10-CM | POA: Diagnosis not present

## 2014-07-25 DIAGNOSIS — Z992 Dependence on renal dialysis: Secondary | ICD-10-CM | POA: Diagnosis not present

## 2014-07-25 DIAGNOSIS — L298 Other pruritus: Secondary | ICD-10-CM | POA: Diagnosis not present

## 2014-07-25 DIAGNOSIS — N2581 Secondary hyperparathyroidism of renal origin: Secondary | ICD-10-CM | POA: Diagnosis not present

## 2014-07-25 DIAGNOSIS — D509 Iron deficiency anemia, unspecified: Secondary | ICD-10-CM | POA: Diagnosis not present

## 2014-07-25 DIAGNOSIS — N186 End stage renal disease: Secondary | ICD-10-CM | POA: Diagnosis not present

## 2014-07-27 DIAGNOSIS — L298 Other pruritus: Secondary | ICD-10-CM | POA: Diagnosis not present

## 2014-07-27 DIAGNOSIS — N186 End stage renal disease: Secondary | ICD-10-CM | POA: Diagnosis not present

## 2014-07-27 DIAGNOSIS — D509 Iron deficiency anemia, unspecified: Secondary | ICD-10-CM | POA: Diagnosis not present

## 2014-07-27 DIAGNOSIS — Z992 Dependence on renal dialysis: Secondary | ICD-10-CM | POA: Diagnosis not present

## 2014-07-27 DIAGNOSIS — N2581 Secondary hyperparathyroidism of renal origin: Secondary | ICD-10-CM | POA: Diagnosis not present

## 2014-07-28 DIAGNOSIS — N186 End stage renal disease: Secondary | ICD-10-CM | POA: Diagnosis not present

## 2014-07-28 DIAGNOSIS — Z992 Dependence on renal dialysis: Secondary | ICD-10-CM | POA: Diagnosis not present

## 2014-07-29 DIAGNOSIS — N2581 Secondary hyperparathyroidism of renal origin: Secondary | ICD-10-CM | POA: Diagnosis not present

## 2014-07-29 DIAGNOSIS — Z992 Dependence on renal dialysis: Secondary | ICD-10-CM | POA: Diagnosis not present

## 2014-07-29 DIAGNOSIS — N186 End stage renal disease: Secondary | ICD-10-CM | POA: Diagnosis not present

## 2014-07-29 DIAGNOSIS — D509 Iron deficiency anemia, unspecified: Secondary | ICD-10-CM | POA: Diagnosis not present

## 2014-07-29 DIAGNOSIS — N250 Renal osteodystrophy: Secondary | ICD-10-CM | POA: Diagnosis not present

## 2014-08-01 DIAGNOSIS — Z992 Dependence on renal dialysis: Secondary | ICD-10-CM | POA: Diagnosis not present

## 2014-08-01 DIAGNOSIS — D509 Iron deficiency anemia, unspecified: Secondary | ICD-10-CM | POA: Diagnosis not present

## 2014-08-01 DIAGNOSIS — N186 End stage renal disease: Secondary | ICD-10-CM | POA: Diagnosis not present

## 2014-08-01 DIAGNOSIS — N2581 Secondary hyperparathyroidism of renal origin: Secondary | ICD-10-CM | POA: Diagnosis not present

## 2014-08-01 DIAGNOSIS — N250 Renal osteodystrophy: Secondary | ICD-10-CM | POA: Diagnosis not present

## 2014-08-03 DIAGNOSIS — D509 Iron deficiency anemia, unspecified: Secondary | ICD-10-CM | POA: Diagnosis not present

## 2014-08-03 DIAGNOSIS — N2581 Secondary hyperparathyroidism of renal origin: Secondary | ICD-10-CM | POA: Diagnosis not present

## 2014-08-03 DIAGNOSIS — Z992 Dependence on renal dialysis: Secondary | ICD-10-CM | POA: Diagnosis not present

## 2014-08-03 DIAGNOSIS — N250 Renal osteodystrophy: Secondary | ICD-10-CM | POA: Diagnosis not present

## 2014-08-03 DIAGNOSIS — N186 End stage renal disease: Secondary | ICD-10-CM | POA: Diagnosis not present

## 2014-08-05 DIAGNOSIS — D509 Iron deficiency anemia, unspecified: Secondary | ICD-10-CM | POA: Diagnosis not present

## 2014-08-05 DIAGNOSIS — Z992 Dependence on renal dialysis: Secondary | ICD-10-CM | POA: Diagnosis not present

## 2014-08-05 DIAGNOSIS — N2581 Secondary hyperparathyroidism of renal origin: Secondary | ICD-10-CM | POA: Diagnosis not present

## 2014-08-05 DIAGNOSIS — N250 Renal osteodystrophy: Secondary | ICD-10-CM | POA: Diagnosis not present

## 2014-08-05 DIAGNOSIS — N186 End stage renal disease: Secondary | ICD-10-CM | POA: Diagnosis not present

## 2014-08-08 ENCOUNTER — Encounter (HOSPITAL_COMMUNITY): Payer: Self-pay | Admitting: Emergency Medicine

## 2014-08-08 ENCOUNTER — Emergency Department (HOSPITAL_COMMUNITY)
Admission: EM | Admit: 2014-08-08 | Discharge: 2014-08-08 | Disposition: A | Discharge status: Home / Self Care | Payer: Medicare Other | Attending: Emergency Medicine | Admitting: Emergency Medicine

## 2014-08-08 ENCOUNTER — Emergency Department (HOSPITAL_COMMUNITY): Payer: Medicare Other

## 2014-08-08 DIAGNOSIS — Z79899 Other long term (current) drug therapy: Secondary | ICD-10-CM | POA: Insufficient documentation

## 2014-08-08 DIAGNOSIS — N186 End stage renal disease: Secondary | ICD-10-CM | POA: Diagnosis not present

## 2014-08-08 DIAGNOSIS — Z992 Dependence on renal dialysis: Secondary | ICD-10-CM | POA: Diagnosis not present

## 2014-08-08 DIAGNOSIS — Z8719 Personal history of other diseases of the digestive system: Secondary | ICD-10-CM | POA: Insufficient documentation

## 2014-08-08 DIAGNOSIS — D509 Iron deficiency anemia, unspecified: Secondary | ICD-10-CM | POA: Diagnosis not present

## 2014-08-08 DIAGNOSIS — Z87891 Personal history of nicotine dependence: Secondary | ICD-10-CM | POA: Insufficient documentation

## 2014-08-08 DIAGNOSIS — B351 Tinea unguium: Secondary | ICD-10-CM | POA: Diagnosis not present

## 2014-08-08 DIAGNOSIS — L97522 Non-pressure chronic ulcer of other part of left foot with fat layer exposed: Secondary | ICD-10-CM | POA: Diagnosis not present

## 2014-08-08 DIAGNOSIS — G629 Polyneuropathy, unspecified: Secondary | ICD-10-CM | POA: Diagnosis not present

## 2014-08-08 DIAGNOSIS — Z8673 Personal history of transient ischemic attack (TIA), and cerebral infarction without residual deficits: Secondary | ICD-10-CM | POA: Insufficient documentation

## 2014-08-08 DIAGNOSIS — I739 Peripheral vascular disease, unspecified: Secondary | ICD-10-CM | POA: Diagnosis not present

## 2014-08-08 DIAGNOSIS — N250 Renal osteodystrophy: Secondary | ICD-10-CM | POA: Diagnosis not present

## 2014-08-08 DIAGNOSIS — R0602 Shortness of breath: Secondary | ICD-10-CM

## 2014-08-08 DIAGNOSIS — I120 Hypertensive chronic kidney disease with stage 5 chronic kidney disease or end stage renal disease: Secondary | ICD-10-CM | POA: Diagnosis not present

## 2014-08-08 DIAGNOSIS — N2581 Secondary hyperparathyroidism of renal origin: Secondary | ICD-10-CM | POA: Diagnosis not present

## 2014-08-08 LAB — CBC WITH DIFFERENTIAL/PLATELET
Basophils Absolute: 0 10*3/uL (ref 0.0–0.1)
Basophils Relative: 0 % (ref 0–1)
Eosinophils Absolute: 0.4 10*3/uL (ref 0.0–0.7)
Eosinophils Relative: 4 % (ref 0–5)
HCT: 41 % (ref 39.0–52.0)
HEMOGLOBIN: 13.1 g/dL (ref 13.0–17.0)
LYMPHS ABS: 1.2 10*3/uL (ref 0.7–4.0)
Lymphocytes Relative: 11 % — ABNORMAL LOW (ref 12–46)
MCH: 21.3 pg — ABNORMAL LOW (ref 26.0–34.0)
MCHC: 32 g/dL (ref 30.0–36.0)
MCV: 66.6 fL — ABNORMAL LOW (ref 78.0–100.0)
MONOS PCT: 5 % (ref 3–12)
Monocytes Absolute: 0.5 10*3/uL (ref 0.1–1.0)
NEUTROS ABS: 9.3 10*3/uL — AB (ref 1.7–7.7)
NEUTROS PCT: 81 % — AB (ref 43–77)
Platelets: 279 10*3/uL (ref 150–400)
RBC: 6.16 MIL/uL — AB (ref 4.22–5.81)
RDW: 19.3 % — ABNORMAL HIGH (ref 11.5–15.5)
WBC: 11.5 10*3/uL — ABNORMAL HIGH (ref 4.0–10.5)

## 2014-08-08 LAB — BASIC METABOLIC PANEL
ANION GAP: 15 (ref 5–15)
BUN: 42 mg/dL — ABNORMAL HIGH (ref 6–23)
CALCIUM: 9.6 mg/dL (ref 8.4–10.5)
CO2: 28 mmol/L (ref 19–32)
Chloride: 99 mmol/L (ref 96–112)
Creatinine, Ser: 12.05 mg/dL — ABNORMAL HIGH (ref 0.50–1.35)
GFR, EST AFRICAN AMERICAN: 4 mL/min — AB (ref >90)
GFR, EST NON AFRICAN AMERICAN: 4 mL/min — AB (ref >90)
GLUCOSE: 96 mg/dL (ref 70–99)
POTASSIUM: 3.4 mmol/L — AB (ref 3.5–5.1)
SODIUM: 142 mmol/L (ref 135–145)

## 2014-08-08 LAB — TROPONIN I: Troponin I: 0.1 ng/mL — ABNORMAL HIGH (ref <0.031)

## 2014-08-08 NOTE — ED Provider Notes (Signed)
CSN: [id number]     Arrival date & time 08/08/14  [id number] History   First MD Initiated Contact with Patient 08/08/14 0536     Chief Complaint  Patient presents with  . Shortness of Breath     (Consider location/radiation/quality/duration/timing/severity/associated sxs/prior Treatment) HPI  Patient has end-stage renal disease and normally gets dialysis on Monday (today), Wednesday and Fridays. He states he has on dialysis for 16 years and he has never missed a treatment. He normally goes at 6:30 in the morning. He states about 3 AM this morning he started getting short of breath. He denies wheezing, chest pain or fever. He denies any swelling of his legs but states his abdomen feels a little bit swollen. He states last time he felt like this he had fluid overload and needed dialysis. He denies eating anything he should know of the last couple of days. Specifically he denies any increased salt intake. Patient states he went to his dialysis unit this morning ([facility]) which was open however they refused to do his dialysis and told him to come to the emergency department.   PCP Dr Legrand Rams Dialysis Dr Lowanda Foster  Past Medical History  Diagnosis Date  . Hypertension   . CVA (cerebral infarction)   . Gastric ulcer 2004    treated for h.pylori see PSH  . LVH (left ventricular hypertrophy)   . Stroke   . ESRD (end stage renal disease) on dialysis   . Dialysis patient    Past Surgical History  Procedure Laterality Date  . Cholecystectomy    . Shoulder surgery    . Colonoscopy  2004    Dr. Irving Shows, left sided diverticula and cecal polyp, path unknown  . Esophagogastroduodenoscopy  11/2002    Dr. Gala Romney, erosive reflux esophagitis, multiple gastric ulcer and antral/bulbar erosions. Serologies positive for H.Pylori and was treated  . Esophagogastroduodenoscopy  11/20014    Dr. Gala Romney, small hh only, ulcers healed  . Esophagogastroduodenoscopy  09/21/2011    Dr Trevor Iha [facility], antral erosions,  ?early GAVE  . Colonoscopy  10/29/2011    Procedure: COLONOSCOPY;  Surgeon: Daneil Dolin, MD;  Location[facility];  Service: Endoscopy;  Laterality: N/A;  10:15  . Arteriovenous graft placement Right right arm  . Abdominal aortagram N/A 01/24/2012    Procedure: ABDOMINAL Maxcine Ham;  Surgeon: Elam Dutch, MD;  Location: [facility] CATH LAB;  Service: Cardiovascular;  Laterality: N/A;   Family History  Problem Relation Age of Onset  . Colon cancer Neg Hx   . Liver disease Neg Hx    History  Substance Use Topics  . Smoking status: Former Smoker    Quit date: 04/29/2004  . Smokeless tobacco: Former Systems developer    Types: [place] date: 01/16/1987     Comment: quit 2006  . Alcohol Use: No  lives alone  Review of Systems  All other systems reviewed and are negative.     Allergies  Aspirin  Home Medications   Prior to Admission medications   Medication Sig Start Date End Date Taking? Authorizing Provider  ALPRAZolam (XANAX) 0.25 MG tablet Take 0.25 mg by mouth 3 (three) times daily.   Yes Historical Provider, MD  amLODipine (NORVASC) 5 MG tablet Take 5 mg by mouth daily.   Yes Historical Provider, MD  b complex-vitamin c-folic acid (NEPHRO-VITE) 0.8 MG TABS Take 0.8 mg by mouth as directed. Takes on Tuesdays, Thursdays, Saturdays, and Sundays. Does not take on Mondays, Wednesdays, and Fridays due  to Dialysis treatments.   Yes Historical Provider, MD  cinacalcet (SENSIPAR) 60 MG tablet Take 60 mg by mouth every evening. With evening meal   Yes Historical Provider, MD  HYDROcodone-acetaminophen (NORCO/VICODIN) 5-325 MG per tablet Take 1 tablet by mouth every 4 (four) hours as needed for pain. 07/12/12  Yes Prentiss Bells, MD  hydrOXYzine (ATARAX/VISTARIL) 10 MG tablet Take 10 mg by mouth as needed. For itching   Yes Historical Provider, MD  metoprolol succinate (TOPROL-XL) 50 MG 24 hr tablet Take 50 mg by mouth daily.    Yes Historical Provider, MD  sevelamer (RENVELA) 800 MG tablet  Take 2,400-4,000 mg by mouth 3 (three) times daily with meals. Patient takes 5 tablets with meals and 3 tablets with snacks   Yes Historical Provider, MD  promethazine (PHENERGAN) 25 MG tablet Take 12.5 mg by mouth Daily. 07/22/11   Historical Provider, MD   BP 157/97 mmHg  Pulse 101  Temp(Src) 97.5 F (36.4 C) (Oral)  Resp 20  Ht 5\' 5"  (1.651 m)  Wt 185 lb (83.915 kg)  BMI 30.79 kg/m2  SpO2 96%  Vital signs normal except tachycardia  Physical Exam  Constitutional: He is oriented to person, place, and time. He appears well-developed and well-nourished.  Non-toxic appearance. He does not appear ill. No distress.  HENT:  Head: Normocephalic and atraumatic.  Right Ear: External ear normal.  Left Ear: External ear normal.  Nose: Nose normal. No mucosal edema or rhinorrhea.  Mouth/Throat: Oropharynx is clear and moist and mucous membranes are normal. No dental abscesses or uvula swelling.  Eyes: Conjunctivae and EOM are normal. Pupils are equal, round, and reactive to light.  Neck: Normal range of motion and full passive range of motion without pain. Neck supple.  Cardiovascular: Normal rate, regular rhythm and normal heart sounds.  Exam reveals no gallop and no friction rub.   No murmur heard. Pulmonary/Chest: No accessory muscle usage. No tachypnea. No respiratory distress. He has decreased breath sounds. He has no wheezes. He has no rhonchi. He has no rales. He exhibits no tenderness and no crepitus.  Pt is noted to have a cough which he states is chronic  Abdominal: Soft. Normal appearance and bowel sounds are normal. He exhibits distension. There is no tenderness. There is no rebound and no guarding.  Abdomen distended but soft  Musculoskeletal: Normal range of motion. He exhibits no edema or tenderness.  Moves all extremities well.   Neurological: He is alert and oriented to person, place, and time. He has normal strength. No cranial nerve deficit.  Skin: Skin is warm, dry and  intact. No rash noted. No erythema. No pallor.  Psychiatric: He has a normal mood and affect. His speech is normal and behavior is normal. His mood appears not anxious.  Nursing note and vitals reviewed.   ED Course  Procedures (including critical care time)   07:45 Patient denies having any chest pain. He states when he went to the dialysis center he asked if he can have oxygen and that was when the nurse told him he needed to come to the emergency department. Patient's pulse ox is been 96-100% on RA while I sit in the room and talk to him. Patient is not in any respiratory distress. Patient now admits he drank more fluids than usual over the weekend because he had several family events he went to.   07:36 Dr Lowanda Foster, will call the dialysis center and let them know he is coming to get  dialysis this morning  Review review of patient's prior testing shows he had elevated troponins in August 2011. When I review his hospital records from that time. He had been admitted to the hospital with congestive heart failure and received dialysis. I feel his elevated troponin is related to his congestive heart failure today.   Labs Review Results for orders placed or performed during the hospital encounter of [id number]  Basic metabolic panel  Result Value Ref Range   Sodium 142 135 - 145 mmol/L   Potassium 3.4 (L) 3.5 - 5.1 mmol/L   Chloride 99 96 - 112 mmol/L   CO2 28 19 - 32 mmol/L   Glucose, Bld 96 70 - 99 mg/dL   BUN 42 (H) 6 - 23 mg/dL   Creatinine, Ser 12.05 (H) 0.50 - 1.35 mg/dL   Calcium 9.6 8.4 - 10.5 mg/dL   GFR calc non Af Amer 4 (L) >90 mL/min   GFR calc Af Amer 4 (L) >90 mL/min   Anion gap 15 5 - 15  CBC with Differential  Result Value Ref Range   WBC 11.5 (H) 4.0 - 10.5 K/uL   RBC 6.16 (H) 4.22 - 5.81 MIL/uL   Hemoglobin 13.1 13.0 - 17.0 g/dL   HCT 41.0 39.0 - 52.0 %   MCV 66.6 (L) 78.0 - 100.0 fL   MCH 21.3 (L) 26.0 - 34.0 pg   MCHC 32.0 30.0 - 36.0 g/dL   RDW 19.3 (H) 11.5  - 15.5 %   Platelets 279 150 - 400 K/uL   Neutrophils Relative % 81 (H) 43 - 77 %   Neutro Abs 9.3 (H) 1.7 - 7.7 K/uL   Lymphocytes Relative 11 (L) 12 - 46 %   Lymphs Abs 1.2 0.7 - 4.0 K/uL   Monocytes Relative 5 3 - 12 %   Monocytes Absolute 0.5 0.1 - 1.0 K/uL   Eosinophils Relative 4 0 - 5 %   Eosinophils Absolute 0.4 0.0 - 0.7 K/uL   Basophils Relative 0 0 - 1 %   Basophils Absolute 0.0 0.0 - 0.1 K/uL  Troponin I  Result Value Ref Range   Troponin I 0.10 (H) <0.031 ng/mL   Laboratory interpretation all normal except chronic renal failure     Imaging Review Dg Chest 2 View  08/08/2014   CLINICAL DATA:  Shortness of breath  EXAM: CHEST  2 VIEW  COMPARISON:  08/17/2012  FINDINGS: There is borderline cardiomegaly. Mild aortic tortuosity for age, stable from prior.  Interstitial coarsening with [employer]. No asymmetric consolidation. There is chronic elevation of the posterior left diaphragm with possible diaphragmatic pleural calcification.  IMPRESSION: Mild pulmonary edema.   Electronically Signed   By: Monte Fantasia M.D.   On: 08/08/2014 06:09     EKG Interpretation   Date/Time:  Monday August 08 2014 05:41:48 EDT Ventricular Rate:  105 PR Interval:  163 QRS Duration: 116 QT Interval:  393 QTC Calculation: 519 R Axis:   -22 Text Interpretation:  Sinus tachycardia Nonspecific intraventricular  conduction delay Nonspecific T abnormalities, lateral leads No significant  change since last tracing 17 Aug 2012 Confirmed by [facility]  MD-I, Greenlee Ancheta  ([id number]) on 08/08/2014 5:46:51 AM      MDM   Final diagnoses:  SOB (shortness of breath)  ESRD needing dialysis    Plan discharge  [name], MD, Barbette Or, MD 08/08/14 [phone]

## 2014-08-08 NOTE — ED Notes (Signed)
Pt denies any pain or shortness of breath and states that he is "feeling pretty good." He asked about dialysis and was informed that a consult with nephrology has been ordered. Pt comfortable in room, no acute distress noted.

## 2014-08-08 NOTE — ED Notes (Signed)
Patient c/o shortness of breath since 0300; patient is supposed to be at dialysis in an hour.

## 2014-08-08 NOTE — Discharge Instructions (Signed)
Dr Lowanda Foster is going to call [facility] to let them know you are coming back this morning to get your dialysis done.

## 2014-08-10 DIAGNOSIS — N250 Renal osteodystrophy: Secondary | ICD-10-CM | POA: Diagnosis not present

## 2014-08-10 DIAGNOSIS — D509 Iron deficiency anemia, unspecified: Secondary | ICD-10-CM | POA: Diagnosis not present

## 2014-08-10 DIAGNOSIS — N186 End stage renal disease: Secondary | ICD-10-CM | POA: Diagnosis not present

## 2014-08-10 DIAGNOSIS — N2581 Secondary hyperparathyroidism of renal origin: Secondary | ICD-10-CM | POA: Diagnosis not present

## 2014-08-10 DIAGNOSIS — Z992 Dependence on renal dialysis: Secondary | ICD-10-CM | POA: Diagnosis not present

## 2014-08-12 DIAGNOSIS — N2581 Secondary hyperparathyroidism of renal origin: Secondary | ICD-10-CM | POA: Diagnosis not present

## 2014-08-12 DIAGNOSIS — N250 Renal osteodystrophy: Secondary | ICD-10-CM | POA: Diagnosis not present

## 2014-08-12 DIAGNOSIS — D509 Iron deficiency anemia, unspecified: Secondary | ICD-10-CM | POA: Diagnosis not present

## 2014-08-12 DIAGNOSIS — Z992 Dependence on renal dialysis: Secondary | ICD-10-CM | POA: Diagnosis not present

## 2014-08-12 DIAGNOSIS — N186 End stage renal disease: Secondary | ICD-10-CM | POA: Diagnosis not present

## 2014-08-15 DIAGNOSIS — I739 Peripheral vascular disease, unspecified: Secondary | ICD-10-CM | POA: Diagnosis not present

## 2014-08-15 DIAGNOSIS — Z992 Dependence on renal dialysis: Secondary | ICD-10-CM | POA: Diagnosis not present

## 2014-08-15 DIAGNOSIS — N186 End stage renal disease: Secondary | ICD-10-CM | POA: Diagnosis not present

## 2014-08-15 DIAGNOSIS — L97522 Non-pressure chronic ulcer of other part of left foot with fat layer exposed: Secondary | ICD-10-CM | POA: Diagnosis not present

## 2014-08-15 DIAGNOSIS — D509 Iron deficiency anemia, unspecified: Secondary | ICD-10-CM | POA: Diagnosis not present

## 2014-08-15 DIAGNOSIS — G629 Polyneuropathy, unspecified: Secondary | ICD-10-CM | POA: Diagnosis not present

## 2014-08-15 DIAGNOSIS — L97512 Non-pressure chronic ulcer of other part of right foot with fat layer exposed: Secondary | ICD-10-CM | POA: Diagnosis not present

## 2014-08-15 DIAGNOSIS — N2581 Secondary hyperparathyroidism of renal origin: Secondary | ICD-10-CM | POA: Diagnosis not present

## 2014-08-15 DIAGNOSIS — N250 Renal osteodystrophy: Secondary | ICD-10-CM | POA: Diagnosis not present

## 2014-08-17 DIAGNOSIS — N2581 Secondary hyperparathyroidism of renal origin: Secondary | ICD-10-CM | POA: Diagnosis not present

## 2014-08-17 DIAGNOSIS — N250 Renal osteodystrophy: Secondary | ICD-10-CM | POA: Diagnosis not present

## 2014-08-17 DIAGNOSIS — Z992 Dependence on renal dialysis: Secondary | ICD-10-CM | POA: Diagnosis not present

## 2014-08-17 DIAGNOSIS — N186 End stage renal disease: Secondary | ICD-10-CM | POA: Diagnosis not present

## 2014-08-17 DIAGNOSIS — D509 Iron deficiency anemia, unspecified: Secondary | ICD-10-CM | POA: Diagnosis not present

## 2014-08-19 DIAGNOSIS — N2581 Secondary hyperparathyroidism of renal origin: Secondary | ICD-10-CM | POA: Diagnosis not present

## 2014-08-19 DIAGNOSIS — Z992 Dependence on renal dialysis: Secondary | ICD-10-CM | POA: Diagnosis not present

## 2014-08-19 DIAGNOSIS — N250 Renal osteodystrophy: Secondary | ICD-10-CM | POA: Diagnosis not present

## 2014-08-19 DIAGNOSIS — N186 End stage renal disease: Secondary | ICD-10-CM | POA: Diagnosis not present

## 2014-08-19 DIAGNOSIS — D509 Iron deficiency anemia, unspecified: Secondary | ICD-10-CM | POA: Diagnosis not present

## 2014-08-22 DIAGNOSIS — N186 End stage renal disease: Secondary | ICD-10-CM | POA: Diagnosis not present

## 2014-08-22 DIAGNOSIS — I739 Peripheral vascular disease, unspecified: Secondary | ICD-10-CM | POA: Diagnosis not present

## 2014-08-22 DIAGNOSIS — G629 Polyneuropathy, unspecified: Secondary | ICD-10-CM | POA: Diagnosis not present

## 2014-08-22 DIAGNOSIS — L97522 Non-pressure chronic ulcer of other part of left foot with fat layer exposed: Secondary | ICD-10-CM | POA: Diagnosis not present

## 2014-08-22 DIAGNOSIS — N250 Renal osteodystrophy: Secondary | ICD-10-CM | POA: Diagnosis not present

## 2014-08-22 DIAGNOSIS — N2581 Secondary hyperparathyroidism of renal origin: Secondary | ICD-10-CM | POA: Diagnosis not present

## 2014-08-22 DIAGNOSIS — D509 Iron deficiency anemia, unspecified: Secondary | ICD-10-CM | POA: Diagnosis not present

## 2014-08-22 DIAGNOSIS — L97512 Non-pressure chronic ulcer of other part of right foot with fat layer exposed: Secondary | ICD-10-CM | POA: Diagnosis not present

## 2014-08-22 DIAGNOSIS — Z992 Dependence on renal dialysis: Secondary | ICD-10-CM | POA: Diagnosis not present

## 2014-08-24 DIAGNOSIS — N250 Renal osteodystrophy: Secondary | ICD-10-CM | POA: Diagnosis not present

## 2014-08-24 DIAGNOSIS — N186 End stage renal disease: Secondary | ICD-10-CM | POA: Diagnosis not present

## 2014-08-24 DIAGNOSIS — N2581 Secondary hyperparathyroidism of renal origin: Secondary | ICD-10-CM | POA: Diagnosis not present

## 2014-08-24 DIAGNOSIS — Z992 Dependence on renal dialysis: Secondary | ICD-10-CM | POA: Diagnosis not present

## 2014-08-24 DIAGNOSIS — D509 Iron deficiency anemia, unspecified: Secondary | ICD-10-CM | POA: Diagnosis not present

## 2014-08-25 DIAGNOSIS — M79641 Pain in right hand: Secondary | ICD-10-CM | POA: Diagnosis not present

## 2014-08-25 DIAGNOSIS — I10 Essential (primary) hypertension: Secondary | ICD-10-CM | POA: Diagnosis not present

## 2014-08-25 DIAGNOSIS — N186 End stage renal disease: Secondary | ICD-10-CM | POA: Diagnosis not present

## 2014-08-26 DIAGNOSIS — N250 Renal osteodystrophy: Secondary | ICD-10-CM | POA: Diagnosis not present

## 2014-08-26 DIAGNOSIS — D509 Iron deficiency anemia, unspecified: Secondary | ICD-10-CM | POA: Diagnosis not present

## 2014-08-26 DIAGNOSIS — N2581 Secondary hyperparathyroidism of renal origin: Secondary | ICD-10-CM | POA: Diagnosis not present

## 2014-08-26 DIAGNOSIS — N186 End stage renal disease: Secondary | ICD-10-CM | POA: Diagnosis not present

## 2014-08-26 DIAGNOSIS — Z992 Dependence on renal dialysis: Secondary | ICD-10-CM | POA: Diagnosis not present

## 2014-08-27 DIAGNOSIS — N186 End stage renal disease: Secondary | ICD-10-CM | POA: Diagnosis not present

## 2014-08-27 DIAGNOSIS — Z992 Dependence on renal dialysis: Secondary | ICD-10-CM | POA: Diagnosis not present

## 2014-08-29 DIAGNOSIS — N2581 Secondary hyperparathyroidism of renal origin: Secondary | ICD-10-CM | POA: Diagnosis not present

## 2014-08-29 DIAGNOSIS — D509 Iron deficiency anemia, unspecified: Secondary | ICD-10-CM | POA: Diagnosis not present

## 2014-08-29 DIAGNOSIS — Z992 Dependence on renal dialysis: Secondary | ICD-10-CM | POA: Diagnosis not present

## 2014-08-29 DIAGNOSIS — N186 End stage renal disease: Secondary | ICD-10-CM | POA: Diagnosis not present

## 2014-08-30 ENCOUNTER — Encounter (HOSPITAL_BASED_OUTPATIENT_CLINIC_OR_DEPARTMENT_OTHER): Payer: Medicare Other | Attending: General Surgery

## 2014-08-31 DIAGNOSIS — N186 End stage renal disease: Secondary | ICD-10-CM | POA: Diagnosis not present

## 2014-08-31 DIAGNOSIS — N2581 Secondary hyperparathyroidism of renal origin: Secondary | ICD-10-CM | POA: Diagnosis not present

## 2014-08-31 DIAGNOSIS — D509 Iron deficiency anemia, unspecified: Secondary | ICD-10-CM | POA: Diagnosis not present

## 2014-08-31 DIAGNOSIS — Z992 Dependence on renal dialysis: Secondary | ICD-10-CM | POA: Diagnosis not present

## 2014-09-02 DIAGNOSIS — N2581 Secondary hyperparathyroidism of renal origin: Secondary | ICD-10-CM | POA: Diagnosis not present

## 2014-09-02 DIAGNOSIS — Z992 Dependence on renal dialysis: Secondary | ICD-10-CM | POA: Diagnosis not present

## 2014-09-02 DIAGNOSIS — N186 End stage renal disease: Secondary | ICD-10-CM | POA: Diagnosis not present

## 2014-09-02 DIAGNOSIS — D509 Iron deficiency anemia, unspecified: Secondary | ICD-10-CM | POA: Diagnosis not present

## 2014-09-05 DIAGNOSIS — N2581 Secondary hyperparathyroidism of renal origin: Secondary | ICD-10-CM | POA: Diagnosis not present

## 2014-09-05 DIAGNOSIS — Z992 Dependence on renal dialysis: Secondary | ICD-10-CM | POA: Diagnosis not present

## 2014-09-05 DIAGNOSIS — D509 Iron deficiency anemia, unspecified: Secondary | ICD-10-CM | POA: Diagnosis not present

## 2014-09-05 DIAGNOSIS — N186 End stage renal disease: Secondary | ICD-10-CM | POA: Diagnosis not present

## 2014-09-07 DIAGNOSIS — Z992 Dependence on renal dialysis: Secondary | ICD-10-CM | POA: Diagnosis not present

## 2014-09-07 DIAGNOSIS — N186 End stage renal disease: Secondary | ICD-10-CM | POA: Diagnosis not present

## 2014-09-07 DIAGNOSIS — D509 Iron deficiency anemia, unspecified: Secondary | ICD-10-CM | POA: Diagnosis not present

## 2014-09-07 DIAGNOSIS — N2581 Secondary hyperparathyroidism of renal origin: Secondary | ICD-10-CM | POA: Diagnosis not present

## 2014-09-09 DIAGNOSIS — N186 End stage renal disease: Secondary | ICD-10-CM | POA: Diagnosis not present

## 2014-09-09 DIAGNOSIS — Z992 Dependence on renal dialysis: Secondary | ICD-10-CM | POA: Diagnosis not present

## 2014-09-09 DIAGNOSIS — D509 Iron deficiency anemia, unspecified: Secondary | ICD-10-CM | POA: Diagnosis not present

## 2014-09-09 DIAGNOSIS — N2581 Secondary hyperparathyroidism of renal origin: Secondary | ICD-10-CM | POA: Diagnosis not present

## 2014-09-12 DIAGNOSIS — D509 Iron deficiency anemia, unspecified: Secondary | ICD-10-CM | POA: Diagnosis not present

## 2014-09-12 DIAGNOSIS — N2581 Secondary hyperparathyroidism of renal origin: Secondary | ICD-10-CM | POA: Diagnosis not present

## 2014-09-12 DIAGNOSIS — Z992 Dependence on renal dialysis: Secondary | ICD-10-CM | POA: Diagnosis not present

## 2014-09-12 DIAGNOSIS — N186 End stage renal disease: Secondary | ICD-10-CM | POA: Diagnosis not present

## 2014-09-14 DIAGNOSIS — D509 Iron deficiency anemia, unspecified: Secondary | ICD-10-CM | POA: Diagnosis not present

## 2014-09-14 DIAGNOSIS — N2581 Secondary hyperparathyroidism of renal origin: Secondary | ICD-10-CM | POA: Diagnosis not present

## 2014-09-14 DIAGNOSIS — N186 End stage renal disease: Secondary | ICD-10-CM | POA: Diagnosis not present

## 2014-09-14 DIAGNOSIS — Z992 Dependence on renal dialysis: Secondary | ICD-10-CM | POA: Diagnosis not present

## 2014-09-16 DIAGNOSIS — N2581 Secondary hyperparathyroidism of renal origin: Secondary | ICD-10-CM | POA: Diagnosis not present

## 2014-09-16 DIAGNOSIS — Z992 Dependence on renal dialysis: Secondary | ICD-10-CM | POA: Diagnosis not present

## 2014-09-16 DIAGNOSIS — D509 Iron deficiency anemia, unspecified: Secondary | ICD-10-CM | POA: Diagnosis not present

## 2014-09-16 DIAGNOSIS — N186 End stage renal disease: Secondary | ICD-10-CM | POA: Diagnosis not present

## 2014-09-19 ENCOUNTER — Other Ambulatory Visit (HOSPITAL_COMMUNITY): Payer: Self-pay | Admitting: Urology

## 2014-09-19 DIAGNOSIS — N186 End stage renal disease: Secondary | ICD-10-CM | POA: Diagnosis not present

## 2014-09-19 DIAGNOSIS — N2581 Secondary hyperparathyroidism of renal origin: Secondary | ICD-10-CM | POA: Diagnosis not present

## 2014-09-19 DIAGNOSIS — N281 Cyst of kidney, acquired: Secondary | ICD-10-CM

## 2014-09-19 DIAGNOSIS — D509 Iron deficiency anemia, unspecified: Secondary | ICD-10-CM | POA: Diagnosis not present

## 2014-09-19 DIAGNOSIS — Z992 Dependence on renal dialysis: Secondary | ICD-10-CM | POA: Diagnosis not present

## 2014-09-21 DIAGNOSIS — D509 Iron deficiency anemia, unspecified: Secondary | ICD-10-CM | POA: Diagnosis not present

## 2014-09-21 DIAGNOSIS — Z992 Dependence on renal dialysis: Secondary | ICD-10-CM | POA: Diagnosis not present

## 2014-09-21 DIAGNOSIS — N186 End stage renal disease: Secondary | ICD-10-CM | POA: Diagnosis not present

## 2014-09-21 DIAGNOSIS — N2581 Secondary hyperparathyroidism of renal origin: Secondary | ICD-10-CM | POA: Diagnosis not present

## 2014-09-22 DIAGNOSIS — I10 Essential (primary) hypertension: Secondary | ICD-10-CM | POA: Diagnosis not present

## 2014-09-22 DIAGNOSIS — M109 Gout, unspecified: Secondary | ICD-10-CM | POA: Diagnosis not present

## 2014-09-22 DIAGNOSIS — K219 Gastro-esophageal reflux disease without esophagitis: Secondary | ICD-10-CM | POA: Diagnosis not present

## 2014-09-22 DIAGNOSIS — N186 End stage renal disease: Secondary | ICD-10-CM | POA: Diagnosis not present

## 2014-09-23 DIAGNOSIS — N2581 Secondary hyperparathyroidism of renal origin: Secondary | ICD-10-CM | POA: Diagnosis not present

## 2014-09-23 DIAGNOSIS — D509 Iron deficiency anemia, unspecified: Secondary | ICD-10-CM | POA: Diagnosis not present

## 2014-09-23 DIAGNOSIS — N186 End stage renal disease: Secondary | ICD-10-CM | POA: Diagnosis not present

## 2014-09-23 DIAGNOSIS — Z992 Dependence on renal dialysis: Secondary | ICD-10-CM | POA: Diagnosis not present

## 2014-09-26 DIAGNOSIS — Z992 Dependence on renal dialysis: Secondary | ICD-10-CM | POA: Diagnosis not present

## 2014-09-26 DIAGNOSIS — D509 Iron deficiency anemia, unspecified: Secondary | ICD-10-CM | POA: Diagnosis not present

## 2014-09-26 DIAGNOSIS — N186 End stage renal disease: Secondary | ICD-10-CM | POA: Diagnosis not present

## 2014-09-26 DIAGNOSIS — N2581 Secondary hyperparathyroidism of renal origin: Secondary | ICD-10-CM | POA: Diagnosis not present

## 2014-09-27 DIAGNOSIS — Z992 Dependence on renal dialysis: Secondary | ICD-10-CM | POA: Diagnosis not present

## 2014-09-27 DIAGNOSIS — N186 End stage renal disease: Secondary | ICD-10-CM | POA: Diagnosis not present

## 2014-09-28 DIAGNOSIS — N186 End stage renal disease: Secondary | ICD-10-CM | POA: Diagnosis not present

## 2014-09-28 DIAGNOSIS — D509 Iron deficiency anemia, unspecified: Secondary | ICD-10-CM | POA: Diagnosis not present

## 2014-09-28 DIAGNOSIS — Z992 Dependence on renal dialysis: Secondary | ICD-10-CM | POA: Diagnosis not present

## 2014-09-28 DIAGNOSIS — N2581 Secondary hyperparathyroidism of renal origin: Secondary | ICD-10-CM | POA: Diagnosis not present

## 2014-09-30 DIAGNOSIS — N186 End stage renal disease: Secondary | ICD-10-CM | POA: Diagnosis not present

## 2014-09-30 DIAGNOSIS — Z992 Dependence on renal dialysis: Secondary | ICD-10-CM | POA: Diagnosis not present

## 2014-09-30 DIAGNOSIS — N2581 Secondary hyperparathyroidism of renal origin: Secondary | ICD-10-CM | POA: Diagnosis not present

## 2014-09-30 DIAGNOSIS — D509 Iron deficiency anemia, unspecified: Secondary | ICD-10-CM | POA: Diagnosis not present

## 2014-10-03 DIAGNOSIS — Z992 Dependence on renal dialysis: Secondary | ICD-10-CM | POA: Diagnosis not present

## 2014-10-03 DIAGNOSIS — N186 End stage renal disease: Secondary | ICD-10-CM | POA: Diagnosis not present

## 2014-10-03 DIAGNOSIS — N2581 Secondary hyperparathyroidism of renal origin: Secondary | ICD-10-CM | POA: Diagnosis not present

## 2014-10-03 DIAGNOSIS — D509 Iron deficiency anemia, unspecified: Secondary | ICD-10-CM | POA: Diagnosis not present

## 2014-10-05 DIAGNOSIS — Z992 Dependence on renal dialysis: Secondary | ICD-10-CM | POA: Diagnosis not present

## 2014-10-05 DIAGNOSIS — N186 End stage renal disease: Secondary | ICD-10-CM | POA: Diagnosis not present

## 2014-10-05 DIAGNOSIS — N2581 Secondary hyperparathyroidism of renal origin: Secondary | ICD-10-CM | POA: Diagnosis not present

## 2014-10-05 DIAGNOSIS — D509 Iron deficiency anemia, unspecified: Secondary | ICD-10-CM | POA: Diagnosis not present

## 2014-10-06 ENCOUNTER — Ambulatory Visit (HOSPITAL_COMMUNITY)
Admission: RE | Admit: 2014-10-06 | Discharge: 2014-10-06 | Disposition: A | Discharge status: Home / Self Care | Payer: Medicare Other | Source: Ambulatory Visit | Attending: Urology | Admitting: Urology

## 2014-10-06 DIAGNOSIS — Z992 Dependence on renal dialysis: Secondary | ICD-10-CM | POA: Diagnosis not present

## 2014-10-06 DIAGNOSIS — K769 Liver disease, unspecified: Secondary | ICD-10-CM | POA: Diagnosis not present

## 2014-10-06 DIAGNOSIS — N281 Cyst of kidney, acquired: Secondary | ICD-10-CM | POA: Diagnosis not present

## 2014-10-06 DIAGNOSIS — K760 Fatty (change of) liver, not elsewhere classified: Secondary | ICD-10-CM | POA: Diagnosis not present

## 2014-10-06 DIAGNOSIS — Q619 Cystic kidney disease, unspecified: Secondary | ICD-10-CM | POA: Insufficient documentation

## 2014-10-07 DIAGNOSIS — N2581 Secondary hyperparathyroidism of renal origin: Secondary | ICD-10-CM | POA: Diagnosis not present

## 2014-10-07 DIAGNOSIS — N186 End stage renal disease: Secondary | ICD-10-CM | POA: Diagnosis not present

## 2014-10-07 DIAGNOSIS — D509 Iron deficiency anemia, unspecified: Secondary | ICD-10-CM | POA: Diagnosis not present

## 2014-10-07 DIAGNOSIS — Z992 Dependence on renal dialysis: Secondary | ICD-10-CM | POA: Diagnosis not present

## 2014-10-10 DIAGNOSIS — N2581 Secondary hyperparathyroidism of renal origin: Secondary | ICD-10-CM | POA: Diagnosis not present

## 2014-10-10 DIAGNOSIS — D509 Iron deficiency anemia, unspecified: Secondary | ICD-10-CM | POA: Diagnosis not present

## 2014-10-10 DIAGNOSIS — Z992 Dependence on renal dialysis: Secondary | ICD-10-CM | POA: Diagnosis not present

## 2014-10-10 DIAGNOSIS — N186 End stage renal disease: Secondary | ICD-10-CM | POA: Diagnosis not present

## 2014-10-11 ENCOUNTER — Encounter: Payer: Self-pay | Admitting: Internal Medicine

## 2014-10-12 DIAGNOSIS — D509 Iron deficiency anemia, unspecified: Secondary | ICD-10-CM | POA: Diagnosis not present

## 2014-10-12 DIAGNOSIS — Z992 Dependence on renal dialysis: Secondary | ICD-10-CM | POA: Diagnosis not present

## 2014-10-12 DIAGNOSIS — N2581 Secondary hyperparathyroidism of renal origin: Secondary | ICD-10-CM | POA: Diagnosis not present

## 2014-10-12 DIAGNOSIS — N186 End stage renal disease: Secondary | ICD-10-CM | POA: Diagnosis not present

## 2014-10-14 DIAGNOSIS — D509 Iron deficiency anemia, unspecified: Secondary | ICD-10-CM | POA: Diagnosis not present

## 2014-10-14 DIAGNOSIS — N2581 Secondary hyperparathyroidism of renal origin: Secondary | ICD-10-CM | POA: Diagnosis not present

## 2014-10-14 DIAGNOSIS — N186 End stage renal disease: Secondary | ICD-10-CM | POA: Diagnosis not present

## 2014-10-14 DIAGNOSIS — Z992 Dependence on renal dialysis: Secondary | ICD-10-CM | POA: Diagnosis not present

## 2014-10-17 DIAGNOSIS — D509 Iron deficiency anemia, unspecified: Secondary | ICD-10-CM | POA: Diagnosis not present

## 2014-10-17 DIAGNOSIS — N2581 Secondary hyperparathyroidism of renal origin: Secondary | ICD-10-CM | POA: Diagnosis not present

## 2014-10-17 DIAGNOSIS — Z992 Dependence on renal dialysis: Secondary | ICD-10-CM | POA: Diagnosis not present

## 2014-10-17 DIAGNOSIS — N186 End stage renal disease: Secondary | ICD-10-CM | POA: Diagnosis not present

## 2014-10-19 DIAGNOSIS — Z992 Dependence on renal dialysis: Secondary | ICD-10-CM | POA: Diagnosis not present

## 2014-10-19 DIAGNOSIS — N186 End stage renal disease: Secondary | ICD-10-CM | POA: Diagnosis not present

## 2014-10-19 DIAGNOSIS — D509 Iron deficiency anemia, unspecified: Secondary | ICD-10-CM | POA: Diagnosis not present

## 2014-10-19 DIAGNOSIS — N2581 Secondary hyperparathyroidism of renal origin: Secondary | ICD-10-CM | POA: Diagnosis not present

## 2014-10-21 DIAGNOSIS — D509 Iron deficiency anemia, unspecified: Secondary | ICD-10-CM | POA: Diagnosis not present

## 2014-10-21 DIAGNOSIS — N2581 Secondary hyperparathyroidism of renal origin: Secondary | ICD-10-CM | POA: Diagnosis not present

## 2014-10-21 DIAGNOSIS — Z992 Dependence on renal dialysis: Secondary | ICD-10-CM | POA: Diagnosis not present

## 2014-10-21 DIAGNOSIS — N186 End stage renal disease: Secondary | ICD-10-CM | POA: Diagnosis not present

## 2014-10-24 DIAGNOSIS — N2581 Secondary hyperparathyroidism of renal origin: Secondary | ICD-10-CM | POA: Diagnosis not present

## 2014-10-24 DIAGNOSIS — D509 Iron deficiency anemia, unspecified: Secondary | ICD-10-CM | POA: Diagnosis not present

## 2014-10-24 DIAGNOSIS — N186 End stage renal disease: Secondary | ICD-10-CM | POA: Diagnosis not present

## 2014-10-24 DIAGNOSIS — Z992 Dependence on renal dialysis: Secondary | ICD-10-CM | POA: Diagnosis not present

## 2014-10-26 DIAGNOSIS — N186 End stage renal disease: Secondary | ICD-10-CM | POA: Diagnosis not present

## 2014-10-26 DIAGNOSIS — N2581 Secondary hyperparathyroidism of renal origin: Secondary | ICD-10-CM | POA: Diagnosis not present

## 2014-10-26 DIAGNOSIS — D509 Iron deficiency anemia, unspecified: Secondary | ICD-10-CM | POA: Diagnosis not present

## 2014-10-26 DIAGNOSIS — Z992 Dependence on renal dialysis: Secondary | ICD-10-CM | POA: Diagnosis not present

## 2014-10-27 DIAGNOSIS — N186 End stage renal disease: Secondary | ICD-10-CM | POA: Diagnosis not present

## 2014-10-27 DIAGNOSIS — Z992 Dependence on renal dialysis: Secondary | ICD-10-CM | POA: Diagnosis not present

## 2014-10-28 DIAGNOSIS — N2581 Secondary hyperparathyroidism of renal origin: Secondary | ICD-10-CM | POA: Diagnosis not present

## 2014-10-28 DIAGNOSIS — D509 Iron deficiency anemia, unspecified: Secondary | ICD-10-CM | POA: Diagnosis not present

## 2014-10-28 DIAGNOSIS — Z992 Dependence on renal dialysis: Secondary | ICD-10-CM | POA: Diagnosis not present

## 2014-10-28 DIAGNOSIS — N186 End stage renal disease: Secondary | ICD-10-CM | POA: Diagnosis not present

## 2014-10-31 DIAGNOSIS — D509 Iron deficiency anemia, unspecified: Secondary | ICD-10-CM | POA: Diagnosis not present

## 2014-10-31 DIAGNOSIS — Z992 Dependence on renal dialysis: Secondary | ICD-10-CM | POA: Diagnosis not present

## 2014-10-31 DIAGNOSIS — N186 End stage renal disease: Secondary | ICD-10-CM | POA: Diagnosis not present

## 2014-10-31 DIAGNOSIS — N2581 Secondary hyperparathyroidism of renal origin: Secondary | ICD-10-CM | POA: Diagnosis not present

## 2014-11-02 DIAGNOSIS — D509 Iron deficiency anemia, unspecified: Secondary | ICD-10-CM | POA: Diagnosis not present

## 2014-11-02 DIAGNOSIS — N2581 Secondary hyperparathyroidism of renal origin: Secondary | ICD-10-CM | POA: Diagnosis not present

## 2014-11-02 DIAGNOSIS — Z992 Dependence on renal dialysis: Secondary | ICD-10-CM | POA: Diagnosis not present

## 2014-11-02 DIAGNOSIS — N186 End stage renal disease: Secondary | ICD-10-CM | POA: Diagnosis not present

## 2014-11-04 DIAGNOSIS — Z992 Dependence on renal dialysis: Secondary | ICD-10-CM | POA: Diagnosis not present

## 2014-11-04 DIAGNOSIS — N2581 Secondary hyperparathyroidism of renal origin: Secondary | ICD-10-CM | POA: Diagnosis not present

## 2014-11-04 DIAGNOSIS — N186 End stage renal disease: Secondary | ICD-10-CM | POA: Diagnosis not present

## 2014-11-04 DIAGNOSIS — D509 Iron deficiency anemia, unspecified: Secondary | ICD-10-CM | POA: Diagnosis not present

## 2014-11-07 DIAGNOSIS — N2581 Secondary hyperparathyroidism of renal origin: Secondary | ICD-10-CM | POA: Diagnosis not present

## 2014-11-07 DIAGNOSIS — N186 End stage renal disease: Secondary | ICD-10-CM | POA: Diagnosis not present

## 2014-11-07 DIAGNOSIS — Z992 Dependence on renal dialysis: Secondary | ICD-10-CM | POA: Diagnosis not present

## 2014-11-07 DIAGNOSIS — D509 Iron deficiency anemia, unspecified: Secondary | ICD-10-CM | POA: Diagnosis not present

## 2014-11-09 DIAGNOSIS — Z992 Dependence on renal dialysis: Secondary | ICD-10-CM | POA: Diagnosis not present

## 2014-11-09 DIAGNOSIS — N2581 Secondary hyperparathyroidism of renal origin: Secondary | ICD-10-CM | POA: Diagnosis not present

## 2014-11-09 DIAGNOSIS — N186 End stage renal disease: Secondary | ICD-10-CM | POA: Diagnosis not present

## 2014-11-09 DIAGNOSIS — D509 Iron deficiency anemia, unspecified: Secondary | ICD-10-CM | POA: Diagnosis not present

## 2014-11-11 DIAGNOSIS — N186 End stage renal disease: Secondary | ICD-10-CM | POA: Diagnosis not present

## 2014-11-11 DIAGNOSIS — D509 Iron deficiency anemia, unspecified: Secondary | ICD-10-CM | POA: Diagnosis not present

## 2014-11-11 DIAGNOSIS — Z992 Dependence on renal dialysis: Secondary | ICD-10-CM | POA: Diagnosis not present

## 2014-11-11 DIAGNOSIS — N2581 Secondary hyperparathyroidism of renal origin: Secondary | ICD-10-CM | POA: Diagnosis not present

## 2014-11-13 DIAGNOSIS — R55 Syncope and collapse: Secondary | ICD-10-CM | POA: Diagnosis present

## 2014-11-13 DIAGNOSIS — Z8673 Personal history of transient ischemic attack (TIA), and cerebral infarction without residual deficits: Secondary | ICD-10-CM | POA: Insufficient documentation

## 2014-11-13 DIAGNOSIS — Z79899 Other long term (current) drug therapy: Secondary | ICD-10-CM | POA: Diagnosis not present

## 2014-11-13 DIAGNOSIS — I120 Hypertensive chronic kidney disease with stage 5 chronic kidney disease or end stage renal disease: Secondary | ICD-10-CM | POA: Insufficient documentation

## 2014-11-13 DIAGNOSIS — Z87891 Personal history of nicotine dependence: Secondary | ICD-10-CM | POA: Insufficient documentation

## 2014-11-13 DIAGNOSIS — Z992 Dependence on renal dialysis: Secondary | ICD-10-CM | POA: Diagnosis not present

## 2014-11-13 DIAGNOSIS — Z4931 Encounter for adequacy testing for hemodialysis: Secondary | ICD-10-CM | POA: Diagnosis not present

## 2014-11-13 DIAGNOSIS — H81399 Other peripheral vertigo, unspecified ear: Secondary | ICD-10-CM | POA: Insufficient documentation

## 2014-11-13 DIAGNOSIS — N186 End stage renal disease: Secondary | ICD-10-CM | POA: Diagnosis not present

## 2014-11-14 ENCOUNTER — Encounter (HOSPITAL_COMMUNITY): Payer: Self-pay | Admitting: *Deleted

## 2014-11-14 ENCOUNTER — Emergency Department (HOSPITAL_COMMUNITY)
Admission: EM | Admit: 2014-11-14 | Discharge: 2014-11-14 | Disposition: A | Discharge status: Home / Self Care | Payer: Medicare Other | Attending: Emergency Medicine | Admitting: Emergency Medicine

## 2014-11-14 DIAGNOSIS — N186 End stage renal disease: Secondary | ICD-10-CM | POA: Diagnosis not present

## 2014-11-14 DIAGNOSIS — N2581 Secondary hyperparathyroidism of renal origin: Secondary | ICD-10-CM | POA: Diagnosis not present

## 2014-11-14 DIAGNOSIS — D509 Iron deficiency anemia, unspecified: Secondary | ICD-10-CM | POA: Diagnosis not present

## 2014-11-14 DIAGNOSIS — Z992 Dependence on renal dialysis: Secondary | ICD-10-CM

## 2014-11-14 DIAGNOSIS — H81399 Other peripheral vertigo, unspecified ear: Secondary | ICD-10-CM

## 2014-11-14 LAB — CBC WITH DIFFERENTIAL/PLATELET
Basophils Absolute: 0.1 10*3/uL (ref 0.0–0.1)
Basophils Relative: 1 % (ref 0–1)
Eosinophils Absolute: 0.4 10*3/uL (ref 0.0–0.7)
Eosinophils Relative: 5 % (ref 0–5)
HEMATOCRIT: 36.7 % — AB (ref 39.0–52.0)
HEMOGLOBIN: 12 g/dL — AB (ref 13.0–17.0)
LYMPHS ABS: 1.8 10*3/uL (ref 0.7–4.0)
Lymphocytes Relative: 22 % (ref 12–46)
MCH: 20.6 pg — ABNORMAL LOW (ref 26.0–34.0)
MCHC: 32.7 g/dL (ref 30.0–36.0)
MCV: 63.1 fL — AB (ref 78.0–100.0)
Monocytes Absolute: 0.6 10*3/uL (ref 0.1–1.0)
Monocytes Relative: 7 % (ref 3–12)
NEUTROS ABS: 5.4 10*3/uL (ref 1.7–7.7)
Neutrophils Relative %: 66 % (ref 43–77)
Platelets: 351 10*3/uL (ref 150–400)
RBC: 5.82 MIL/uL — ABNORMAL HIGH (ref 4.22–5.81)
RDW: 19.1 % — ABNORMAL HIGH (ref 11.5–15.5)
WBC: 8.2 10*3/uL (ref 4.0–10.5)

## 2014-11-14 LAB — BASIC METABOLIC PANEL
Anion gap: 12 (ref 5–15)
BUN: 30 mg/dL — ABNORMAL HIGH (ref 6–20)
CO2: 27 mmol/L (ref 22–32)
CREATININE: 11.74 mg/dL — AB (ref 0.61–1.24)
Calcium: 7.5 mg/dL — ABNORMAL LOW (ref 8.9–10.3)
Chloride: 99 mmol/L — ABNORMAL LOW (ref 101–111)
GFR calc Af Amer: 4 mL/min — ABNORMAL LOW (ref >60)
GFR, EST NON AFRICAN AMERICAN: 4 mL/min — AB (ref >60)
Glucose, Bld: 90 mg/dL (ref 65–99)
Potassium: 3.6 mmol/L (ref 3.5–5.1)
SODIUM: 138 mmol/L (ref 135–145)

## 2014-11-14 MED ORDER — MECLIZINE HCL 25 MG PO TABS: 25.0000 mg | ORAL_TABLET | Freq: Three times a day (TID) | ORAL | Status: DC | PRN

## 2014-11-14 MED ORDER — MECLIZINE HCL 12.5 MG PO TABS
25.0000 mg | ORAL_TABLET | Freq: Once | ORAL | Status: AC
  Administered 2014-11-14: 25 mg via ORAL
  Filled 2014-11-14: qty 2

## 2014-11-14 NOTE — ED Notes (Signed)
Pt states he has been feeling dizzy all day; pt states he feels lightheaded; pt states he feels unsteady on his feet when he walks

## 2014-11-14 NOTE — ED Provider Notes (Signed)
CSN: [id number]     Arrival date & time 11/13/14  2336 History  This chart was scribed for Delora Fuel, MD by Irene Pap, ED Scribe. This patient was seen in room APA11/APA11 and patient care was started at 12:29 AM.     Chief Complaint  Patient presents with  . Near Syncope   The history is provided by the patient. No language interpreter was used.    HPI Comments: [name] is a 74 y.o. male who is a dialysis pt with hx of CVA and stroke who presents to the Emergency Department complaining of dizziness and lightheadedness onset earlier today. States he ate breakfast at church when the dizziness started and states that his BP went up.; reports that it feels like he's drunk and about to pass out, but has not experienced syncope. States that the dizziness worsens with walking. Denies fever, chills, nausea, vomiting. Reports that he receives dialysis on Monday, Wednesday and Friday.   Past Medical History  Diagnosis Date  . Hypertension   . CVA (cerebral infarction)   . Gastric ulcer 2004    treated for h.pylori see PSH  . LVH (left ventricular hypertrophy)   . Stroke   . ESRD (end stage renal disease) on dialysis   . Dialysis patient    Past Surgical History  Procedure Laterality Date  . Cholecystectomy    . Shoulder surgery    . Colonoscopy  2004    Dr. Irving Shows, left sided diverticula and cecal polyp, path unknown  . Esophagogastroduodenoscopy  11/2002    Dr. Gala Romney, erosive reflux esophagitis, multiple gastric ulcer and antral/bulbar erosions. Serologies positive for H.Pylori and was treated  . Esophagogastroduodenoscopy  11/20014    Dr. Gala Romney, small hh only, ulcers healed  . Esophagogastroduodenoscopy  09/21/2011    Dr Trevor Iha [facility], antral erosions, ?early GAVE  . Colonoscopy  10/29/2011    Procedure: COLONOSCOPY;  Surgeon: Daneil Dolin, MD;  [facility];  Service: Endoscopy;  Laterality: N/A;  10:15  . Arteriovenous graft placement Right right arm  .  Abdominal aortagram N/A 01/24/2012    Procedure: ABDOMINAL Maxcine Ham;  Surgeon: Elam Dutch, MD;  Location: [facility] CATH LAB;  Service: Cardiovascular;  Laterality: N/A;   Family History  Problem Relation Age of Onset  . Colon cancer Neg Hx   . Liver disease Neg Hx    History  Substance Use Topics  . Smoking status: Former Smoker    Quit date: 04/29/2004  . Smokeless tobacco: Former Systems developer    Types: [place] date: 01/16/1987     Comment: quit 2006  . Alcohol Use: No    Review of Systems  Constitutional: Negative for fever and chills.  Gastrointestinal: Negative for nausea and vomiting.  Neurological: Positive for dizziness and light-headedness. Negative for syncope.  All other systems reviewed and are negative.  Allergies  Aspirin  Home Medications   Prior to Admission medications   Medication Sig Start Date End Date Taking? Authorizing Provider  ALPRAZolam (XANAX) 0.25 MG tablet Take 0.25 mg by mouth 3 (three) times daily.   Yes Historical Provider, MD  amLODipine (NORVASC) 5 MG tablet Take 5 mg by mouth daily.   Yes Historical Provider, MD  b complex-vitamin c-folic acid (NEPHRO-VITE) 0.8 MG TABS Take 0.8 mg by mouth as directed. Takes on Tuesdays, Thursdays, Saturdays, and Sundays. Does not take on Mondays, Wednesdays, and Fridays due to Dialysis treatments.   Yes Historical Provider, MD  cinacalcet (  SENSIPAR) 60 MG tablet Take 60 mg by mouth every evening. With evening meal   Yes Historical Provider, MD  hydrOXYzine (ATARAX/VISTARIL) 10 MG tablet Take 10 mg by mouth as needed. For itching   Yes Historical Provider, MD  metoprolol succinate (TOPROL-XL) 50 MG 24 hr tablet Take 50 mg by mouth daily.    Yes Historical Provider, MD  promethazine (PHENERGAN) 25 MG tablet Take 12.5 mg by mouth Daily. 07/22/11  Yes Historical Provider, MD  sevelamer (RENVELA) 800 MG tablet Take 2,400-4,000 mg by mouth 3 (three) times daily with meals. Patient takes 5 tablets with meals and 3  tablets with snacks   Yes Historical Provider, MD  HYDROcodone-acetaminophen (NORCO/VICODIN) 5-325 MG per tablet Take 1 tablet by mouth every 4 (four) hours as needed for pain. 07/12/12   Prentiss Bells, MD   BP 131/76 mmHg  Pulse 90  Temp(Src) 98.2 F (36.8 C) (Oral)  Resp 20  Ht 5\' 5"  (1.651 m)  Wt 185 lb (83.915 kg)  BMI 30.79 kg/m2  SpO2 100%  Physical Exam  Constitutional: He is oriented to person, place, and time. He appears well-developed and well-nourished.  HENT:  Head: Normocephalic and atraumatic.  Eyes: EOM are normal. Pupils are equal, round, and reactive to light.  Neck: Normal range of motion. Neck supple. No JVD present.  Cardiovascular: Normal rate, regular rhythm and normal heart sounds.   No murmur heard. Pulmonary/Chest: Effort normal and breath sounds normal. He has no wheezes. He has no rales. He exhibits no tenderness.  Abdominal: Bowel sounds are normal. He exhibits no distension and no mass. There is no tenderness.  Musculoskeletal: Normal range of motion.  AV graft present in right forearm with thrill present.  Lymphadenopathy:    He has no cervical adenopathy.  Neurological: He is alert and oriented to person, place, and time. No cranial nerve deficit. He exhibits normal muscle tone. Coordination normal.  No nystagmus; dizziness reproduced by passive head movement  Skin: Skin is warm and dry. No rash noted.  Psychiatric: He has a normal mood and affect. His behavior is normal. Judgment and thought content normal.  Nursing note and vitals reviewed.   ED Course  Procedures (including critical care time) DIAGNOSTIC STUDIES: Oxygen Saturation is 100% on RA, normal by my interpretation.    COORDINATION OF CARE: 12:31 AM-Discussed treatment plan which includes labs and anti-dizziness medication with pt at bedside and pt agreed to plan.   Labs Review Results for orders placed or performed during the hospital encounter of 11/14/14  CBC with Differential   Result Value Ref Range   WBC 8.2 4.0 - 10.5 K/uL   RBC 5.82 (H) 4.22 - 5.81 MIL/uL   Hemoglobin 12.0 (L) 13.0 - 17.0 g/dL   HCT 36.7 (L) 39.0 - 52.0 %   MCV 63.1 (L) 78.0 - 100.0 fL   MCH 20.6 (L) 26.0 - 34.0 pg   MCHC 32.7 30.0 - 36.0 g/dL   RDW 19.1 (H) 11.5 - 15.5 %   Platelets 351 150 - 400 K/uL   Neutrophils Relative % 66 43 - 77 %   Neutro Abs 5.4 1.7 - 7.7 K/uL   Lymphocytes Relative 22 12 - 46 %   Lymphs Abs 1.8 0.7 - 4.0 K/uL   Monocytes Relative 7 3 - 12 %   Monocytes Absolute 0.6 0.1 - 1.0 K/uL   Eosinophils Relative 5 0 - 5 %   Eosinophils Absolute 0.4 0.0 - 0.7 K/uL   Basophils Relative  1 0 - 1 %   Basophils Absolute 0.1 0.0 - 0.1 K/uL  Basic metabolic panel  Result Value Ref Range   Sodium 138 135 - 145 mmol/L   Potassium 3.6 3.5 - 5.1 mmol/L   Chloride 99 (L) 101 - 111 mmol/L   CO2 27 22 - 32 mmol/L   Glucose, Bld 90 65 - 99 mg/dL   BUN 30 (H) 6 - 20 mg/dL   Creatinine, Ser 11.74 (H) 0.61 - 1.24 mg/dL   Calcium 7.5 (L) 8.9 - 10.3 mg/dL   GFR calc non Af Amer 4 (L) >60 mL/min   GFR calc Af Amer 4 (L) >60 mL/min   Anion gap 12 5 - 15     EKG Interpretation   Date/Time:  Monday November 14 2014 00:20:10 EDT Ventricular Rate:  86 PR Interval:  159 QRS Duration: 130 QT Interval:  436 QTC Calculation: 521 R Axis:   -44 Text Interpretation:  Sinus rhythm Nonspecific IVCD with LAD When compared  with ECG of 08/08/2014, No significant change was found Confirmed by [facility]   MD, Marely Apgar ([id number]) on 11/14/2014 12:24:04 AM      MDM   Final diagnoses:  Peripheral vertigo, unspecified laterality  End-stage renal disease on hemodialysis    Dizziness which seems to be peripheral vertigo. He'll be given therapeutic trial of meclizine. Screening labs are obtained.  Laboratory workup is unremarkable. He had excellent relief of symptoms with oral meclizine and is discharged with prescription for same.   I personally performed the services described in this  documentation, which was scribed in my presence. The recorded information has been reviewed and is accurate.     Delora Fuel, MD [id number]

## 2014-11-14 NOTE — Discharge Instructions (Signed)
Vertigo Vertigo means you feel like you or your surroundings are moving when they are not. Vertigo can be dangerous if it occurs when you are at work, driving, or performing difficult activities.  CAUSES  Vertigo occurs when there is a conflict of signals sent to your brain from the visual and sensory systems in your body. There are many different causes of vertigo, including:  Infections, especially in the inner ear.  A bad reaction to a drug or misuse of alcohol and medicines.  Withdrawal from drugs or alcohol.  Rapidly changing positions, such as lying down or rolling over in bed.  A migraine headache.  Decreased blood flow to the brain.  Increased pressure in the brain from a head injury, infection, tumor, or bleeding. SYMPTOMS  You may feel as though the world is spinning around or you are falling to the ground. Because your balance is upset, vertigo can cause nausea and vomiting. You may have involuntary eye movements (nystagmus). DIAGNOSIS  Vertigo is usually diagnosed by physical exam. If the cause of your vertigo is unknown, your caregiver may perform imaging tests, such as an MRI scan (magnetic resonance imaging). TREATMENT  Most cases of vertigo resolve on their own, without treatment. Depending on the cause, your caregiver may prescribe certain medicines. If your vertigo is related to body position issues, your caregiver may recommend movements or procedures to correct the problem. In rare cases, if your vertigo is caused by certain inner ear problems, you may need surgery. HOME CARE INSTRUCTIONS   Follow your caregiver's instructions.  Avoid driving.  Avoid operating heavy machinery.  Avoid performing any tasks that would be dangerous to you or others during a vertigo episode.  Tell your caregiver if you notice that certain medicines seem to be causing your vertigo. Some of the medicines used to treat vertigo episodes can actually make them worse in some people. SEEK  IMMEDIATE MEDICAL CARE IF:   Your medicines do not relieve your vertigo or are making it worse.  You develop problems with talking, walking, weakness, or using your arms, hands, or legs.  You develop severe headaches.  Your nausea or vomiting continues or gets worse.  You develop visual changes.  A family member notices behavioral changes.  Your condition gets worse. MAKE SURE YOU:  Understand these instructions.  Will watch your condition.  Will get help right away if you are not doing well or get worse. Document Released: 01/23/2005 Document Revised: 07/08/2011 Document Reviewed: 11/01/2010 [facility] Patient Information 2015 [place], [place]. This information is not intended to replace advice given to you by your health care provider. Make sure you discuss any questions you have with your health care provider.  Meclizine tablets or capsules What is this medicine? MECLIZINE (MEK li zeen) is an antihistamine. It is used to prevent nausea, vomiting, or dizziness caused by motion sickness. It is also used to prevent and treat vertigo (extreme dizziness or a feeling that you or your surroundings are tilting or spinning around). This medicine may be used for other purposes; ask your health care provider or pharmacist if you have questions. COMMON BRAND NAME(S): Antivert, Dramamine Less Drowsy, Medivert, Meni-D What should I tell my health care provider before I take this medicine? They need to know if you have any of these conditions: -asthma -glaucoma -prostate trouble -stomach problems -urinary problems -an unusual or allergic reaction to meclizine, other medicines, foods, dyes, or preservatives -pregnant or trying to get pregnant -breast-feeding How should I use this medicine?  Take this medicine by mouth with a glass of water. Follow the directions on the prescription label. If you are using this medicine to prevent motion sickness, take the dose at least 1 hour before travel.  If it upsets your stomach, take it with food or milk. Take your doses at regular intervals. Do not take your medicine more often than directed. Talk to your pediatrician regarding the use of this medicine in children. Special care may be needed. Overdosage: If you think you have taken too much of this medicine contact a poison control center or emergency room at once. NOTE: This medicine is only for you. Do not share this medicine with others. What if I miss a dose? If you miss a dose, take it as soon as you can. If it is almost time for your next dose, take only that dose. Do not take double or extra doses. What may interact with this medicine? -barbiturate medicines for inducing sleep or treating seizures -digoxin -medicines for anxiety or sleeping problems, like alprazolam, diazepam or temazepam -medicines for hay fever and other allergies -medicines for mental depression -medicines for movement abnormalities as in Parkinson's disease, or for stomach problems -medicines for pain -medicines that relax muscles This list may not describe all possible interactions. Give your health care provider a list of all the medicines, herbs, non-prescription drugs, or dietary supplements you use. Also tell them if you smoke, drink alcohol, or use illegal drugs. Some items may interact with your medicine. What should I watch for while using this medicine? If you are taking this medicine on a regular schedule, visit your doctor or health care professional for regular checks on your progress. You may get dizzy, drowsy or have blurred vision. Do not drive, use machinery, or do anything that needs mental alertness until you know how this medicine affects you. Do not stand or sit up quickly, especially if you are an older patient. This reduces the risk of dizzy or fainting spells. Alcohol can increase possible dizziness. Avoid alcoholic drinks. Your mouth may get dry. Chewing sugarless gum or sucking hard candy,  and drinking plenty of water may help. Contact your doctor if the problem does not go away or is severe. This medicine may cause dry eyes and blurred vision. If you wear contact lenses you may feel some discomfort. Lubricating drops may help. See your eye doctor if the problem does not go away or is severe. What side effects may I notice from receiving this medicine? Side effects that you should report to your doctor or health care professional as soon as possible: -fainting spells -fast or irregular heartbeat Side effects that usually do not require medical attention (report to your doctor or health care professional if they continue or are bothersome): -constipation -difficulty passing urine -difficulty sleeping -headache -stomach upset This list may not describe all possible side effects. Call your doctor for medical advice about side effects. You may report side effects to FDA at 1-800-FDA-1088. Where should I keep my medicine? Keep out of the reach of children. Store at room temperature between 15 and 30 degrees C (59 and 86 degrees F). Keep container tightly closed. Throw away any unused medicine after the expiration date. NOTE: This sheet is a summary. It may not cover all possible information. If you have questions about this medicine, talk to your doctor, pharmacist, or health care provider.  2015, Elsevier/Gold Standard. (2007-10-22 10:35:36)

## 2014-11-14 NOTE — ED Notes (Signed)
Pt c/o dizziness that started yesterday while he was at church, Dr Roxanne Mins in prior to RN, see edp assessment for further,

## 2014-11-16 DIAGNOSIS — Z992 Dependence on renal dialysis: Secondary | ICD-10-CM | POA: Diagnosis not present

## 2014-11-16 DIAGNOSIS — N186 End stage renal disease: Secondary | ICD-10-CM | POA: Diagnosis not present

## 2014-11-16 DIAGNOSIS — D509 Iron deficiency anemia, unspecified: Secondary | ICD-10-CM | POA: Diagnosis not present

## 2014-11-16 DIAGNOSIS — N2581 Secondary hyperparathyroidism of renal origin: Secondary | ICD-10-CM | POA: Diagnosis not present

## 2014-11-18 DIAGNOSIS — D509 Iron deficiency anemia, unspecified: Secondary | ICD-10-CM | POA: Diagnosis not present

## 2014-11-18 DIAGNOSIS — N2581 Secondary hyperparathyroidism of renal origin: Secondary | ICD-10-CM | POA: Diagnosis not present

## 2014-11-18 DIAGNOSIS — Z992 Dependence on renal dialysis: Secondary | ICD-10-CM | POA: Diagnosis not present

## 2014-11-18 DIAGNOSIS — N186 End stage renal disease: Secondary | ICD-10-CM | POA: Diagnosis not present

## 2014-11-21 DIAGNOSIS — L851 Acquired keratosis [keratoderma] palmaris et plantaris: Secondary | ICD-10-CM | POA: Diagnosis not present

## 2014-11-21 DIAGNOSIS — G629 Polyneuropathy, unspecified: Secondary | ICD-10-CM | POA: Diagnosis not present

## 2014-11-21 DIAGNOSIS — Z992 Dependence on renal dialysis: Secondary | ICD-10-CM | POA: Diagnosis not present

## 2014-11-21 DIAGNOSIS — N2581 Secondary hyperparathyroidism of renal origin: Secondary | ICD-10-CM | POA: Diagnosis not present

## 2014-11-21 DIAGNOSIS — I739 Peripheral vascular disease, unspecified: Secondary | ICD-10-CM | POA: Diagnosis not present

## 2014-11-21 DIAGNOSIS — B351 Tinea unguium: Secondary | ICD-10-CM | POA: Diagnosis not present

## 2014-11-21 DIAGNOSIS — N186 End stage renal disease: Secondary | ICD-10-CM | POA: Diagnosis not present

## 2014-11-21 DIAGNOSIS — D509 Iron deficiency anemia, unspecified: Secondary | ICD-10-CM | POA: Diagnosis not present

## 2014-11-21 DIAGNOSIS — L84 Corns and callosities: Secondary | ICD-10-CM | POA: Diagnosis not present

## 2014-11-23 DIAGNOSIS — N2581 Secondary hyperparathyroidism of renal origin: Secondary | ICD-10-CM | POA: Diagnosis not present

## 2014-11-23 DIAGNOSIS — N186 End stage renal disease: Secondary | ICD-10-CM | POA: Diagnosis not present

## 2014-11-23 DIAGNOSIS — D509 Iron deficiency anemia, unspecified: Secondary | ICD-10-CM | POA: Diagnosis not present

## 2014-11-23 DIAGNOSIS — Z992 Dependence on renal dialysis: Secondary | ICD-10-CM | POA: Diagnosis not present

## 2014-11-25 DIAGNOSIS — N2581 Secondary hyperparathyroidism of renal origin: Secondary | ICD-10-CM | POA: Diagnosis not present

## 2014-11-25 DIAGNOSIS — D509 Iron deficiency anemia, unspecified: Secondary | ICD-10-CM | POA: Diagnosis not present

## 2014-11-25 DIAGNOSIS — N186 End stage renal disease: Secondary | ICD-10-CM | POA: Diagnosis not present

## 2014-11-25 DIAGNOSIS — Z992 Dependence on renal dialysis: Secondary | ICD-10-CM | POA: Diagnosis not present

## 2014-11-27 DIAGNOSIS — Z992 Dependence on renal dialysis: Secondary | ICD-10-CM | POA: Diagnosis not present

## 2014-11-27 DIAGNOSIS — N186 End stage renal disease: Secondary | ICD-10-CM | POA: Diagnosis not present

## 2014-11-28 DIAGNOSIS — N2581 Secondary hyperparathyroidism of renal origin: Secondary | ICD-10-CM | POA: Diagnosis not present

## 2014-11-28 DIAGNOSIS — D509 Iron deficiency anemia, unspecified: Secondary | ICD-10-CM | POA: Diagnosis not present

## 2014-11-28 DIAGNOSIS — N186 End stage renal disease: Secondary | ICD-10-CM | POA: Diagnosis not present

## 2014-11-28 DIAGNOSIS — Z992 Dependence on renal dialysis: Secondary | ICD-10-CM | POA: Diagnosis not present

## 2014-11-30 DIAGNOSIS — N2581 Secondary hyperparathyroidism of renal origin: Secondary | ICD-10-CM | POA: Diagnosis not present

## 2014-11-30 DIAGNOSIS — D509 Iron deficiency anemia, unspecified: Secondary | ICD-10-CM | POA: Diagnosis not present

## 2014-11-30 DIAGNOSIS — Z992 Dependence on renal dialysis: Secondary | ICD-10-CM | POA: Diagnosis not present

## 2014-11-30 DIAGNOSIS — N186 End stage renal disease: Secondary | ICD-10-CM | POA: Diagnosis not present

## 2014-12-02 DIAGNOSIS — N186 End stage renal disease: Secondary | ICD-10-CM | POA: Diagnosis not present

## 2014-12-02 DIAGNOSIS — D509 Iron deficiency anemia, unspecified: Secondary | ICD-10-CM | POA: Diagnosis not present

## 2014-12-02 DIAGNOSIS — N2581 Secondary hyperparathyroidism of renal origin: Secondary | ICD-10-CM | POA: Diagnosis not present

## 2014-12-02 DIAGNOSIS — Z992 Dependence on renal dialysis: Secondary | ICD-10-CM | POA: Diagnosis not present

## 2014-12-05 DIAGNOSIS — D509 Iron deficiency anemia, unspecified: Secondary | ICD-10-CM | POA: Diagnosis not present

## 2014-12-05 DIAGNOSIS — Z992 Dependence on renal dialysis: Secondary | ICD-10-CM | POA: Diagnosis not present

## 2014-12-05 DIAGNOSIS — N2581 Secondary hyperparathyroidism of renal origin: Secondary | ICD-10-CM | POA: Diagnosis not present

## 2014-12-05 DIAGNOSIS — N186 End stage renal disease: Secondary | ICD-10-CM | POA: Diagnosis not present

## 2014-12-07 DIAGNOSIS — N186 End stage renal disease: Secondary | ICD-10-CM | POA: Diagnosis not present

## 2014-12-07 DIAGNOSIS — Z992 Dependence on renal dialysis: Secondary | ICD-10-CM | POA: Diagnosis not present

## 2014-12-07 DIAGNOSIS — D509 Iron deficiency anemia, unspecified: Secondary | ICD-10-CM | POA: Diagnosis not present

## 2014-12-07 DIAGNOSIS — N2581 Secondary hyperparathyroidism of renal origin: Secondary | ICD-10-CM | POA: Diagnosis not present

## 2014-12-09 DIAGNOSIS — D509 Iron deficiency anemia, unspecified: Secondary | ICD-10-CM | POA: Diagnosis not present

## 2014-12-09 DIAGNOSIS — N2581 Secondary hyperparathyroidism of renal origin: Secondary | ICD-10-CM | POA: Diagnosis not present

## 2014-12-09 DIAGNOSIS — Z992 Dependence on renal dialysis: Secondary | ICD-10-CM | POA: Diagnosis not present

## 2014-12-09 DIAGNOSIS — N186 End stage renal disease: Secondary | ICD-10-CM | POA: Diagnosis not present

## 2014-12-12 DIAGNOSIS — Z992 Dependence on renal dialysis: Secondary | ICD-10-CM | POA: Diagnosis not present

## 2014-12-12 DIAGNOSIS — N186 End stage renal disease: Secondary | ICD-10-CM | POA: Diagnosis not present

## 2014-12-12 DIAGNOSIS — D509 Iron deficiency anemia, unspecified: Secondary | ICD-10-CM | POA: Diagnosis not present

## 2014-12-12 DIAGNOSIS — N2581 Secondary hyperparathyroidism of renal origin: Secondary | ICD-10-CM | POA: Diagnosis not present

## 2014-12-14 DIAGNOSIS — D509 Iron deficiency anemia, unspecified: Secondary | ICD-10-CM | POA: Diagnosis not present

## 2014-12-14 DIAGNOSIS — Z992 Dependence on renal dialysis: Secondary | ICD-10-CM | POA: Diagnosis not present

## 2014-12-14 DIAGNOSIS — N186 End stage renal disease: Secondary | ICD-10-CM | POA: Diagnosis not present

## 2014-12-14 DIAGNOSIS — N2581 Secondary hyperparathyroidism of renal origin: Secondary | ICD-10-CM | POA: Diagnosis not present

## 2014-12-16 DIAGNOSIS — Z992 Dependence on renal dialysis: Secondary | ICD-10-CM | POA: Diagnosis not present

## 2014-12-16 DIAGNOSIS — N186 End stage renal disease: Secondary | ICD-10-CM | POA: Diagnosis not present

## 2014-12-16 DIAGNOSIS — D509 Iron deficiency anemia, unspecified: Secondary | ICD-10-CM | POA: Diagnosis not present

## 2014-12-16 DIAGNOSIS — N2581 Secondary hyperparathyroidism of renal origin: Secondary | ICD-10-CM | POA: Diagnosis not present

## 2014-12-19 DIAGNOSIS — Z992 Dependence on renal dialysis: Secondary | ICD-10-CM | POA: Diagnosis not present

## 2014-12-19 DIAGNOSIS — N186 End stage renal disease: Secondary | ICD-10-CM | POA: Diagnosis not present

## 2014-12-19 DIAGNOSIS — N2581 Secondary hyperparathyroidism of renal origin: Secondary | ICD-10-CM | POA: Diagnosis not present

## 2014-12-19 DIAGNOSIS — D509 Iron deficiency anemia, unspecified: Secondary | ICD-10-CM | POA: Diagnosis not present

## 2014-12-21 DIAGNOSIS — Z992 Dependence on renal dialysis: Secondary | ICD-10-CM | POA: Diagnosis not present

## 2014-12-21 DIAGNOSIS — N186 End stage renal disease: Secondary | ICD-10-CM | POA: Diagnosis not present

## 2014-12-21 DIAGNOSIS — D509 Iron deficiency anemia, unspecified: Secondary | ICD-10-CM | POA: Diagnosis not present

## 2014-12-21 DIAGNOSIS — N2581 Secondary hyperparathyroidism of renal origin: Secondary | ICD-10-CM | POA: Diagnosis not present

## 2014-12-22 DIAGNOSIS — Z23 Encounter for immunization: Secondary | ICD-10-CM | POA: Diagnosis not present

## 2014-12-22 DIAGNOSIS — K219 Gastro-esophageal reflux disease without esophagitis: Secondary | ICD-10-CM | POA: Diagnosis not present

## 2014-12-22 DIAGNOSIS — I10 Essential (primary) hypertension: Secondary | ICD-10-CM | POA: Diagnosis not present

## 2014-12-22 DIAGNOSIS — N186 End stage renal disease: Secondary | ICD-10-CM | POA: Diagnosis not present

## 2014-12-22 DIAGNOSIS — M109 Gout, unspecified: Secondary | ICD-10-CM | POA: Diagnosis not present

## 2014-12-23 DIAGNOSIS — D509 Iron deficiency anemia, unspecified: Secondary | ICD-10-CM | POA: Diagnosis not present

## 2014-12-23 DIAGNOSIS — N2581 Secondary hyperparathyroidism of renal origin: Secondary | ICD-10-CM | POA: Diagnosis not present

## 2014-12-23 DIAGNOSIS — N186 End stage renal disease: Secondary | ICD-10-CM | POA: Diagnosis not present

## 2014-12-23 DIAGNOSIS — Z992 Dependence on renal dialysis: Secondary | ICD-10-CM | POA: Diagnosis not present

## 2014-12-26 DIAGNOSIS — Z992 Dependence on renal dialysis: Secondary | ICD-10-CM | POA: Diagnosis not present

## 2014-12-26 DIAGNOSIS — D509 Iron deficiency anemia, unspecified: Secondary | ICD-10-CM | POA: Diagnosis not present

## 2014-12-26 DIAGNOSIS — N186 End stage renal disease: Secondary | ICD-10-CM | POA: Diagnosis not present

## 2014-12-26 DIAGNOSIS — N2581 Secondary hyperparathyroidism of renal origin: Secondary | ICD-10-CM | POA: Diagnosis not present

## 2014-12-27 DIAGNOSIS — B351 Tinea unguium: Secondary | ICD-10-CM | POA: Diagnosis not present

## 2014-12-27 DIAGNOSIS — E1142 Type 2 diabetes mellitus with diabetic polyneuropathy: Secondary | ICD-10-CM | POA: Diagnosis not present

## 2014-12-27 DIAGNOSIS — I739 Peripheral vascular disease, unspecified: Secondary | ICD-10-CM | POA: Diagnosis not present

## 2014-12-27 DIAGNOSIS — L851 Acquired keratosis [keratoderma] palmaris et plantaris: Secondary | ICD-10-CM | POA: Diagnosis not present

## 2014-12-28 DIAGNOSIS — Z992 Dependence on renal dialysis: Secondary | ICD-10-CM | POA: Diagnosis not present

## 2014-12-28 DIAGNOSIS — N2581 Secondary hyperparathyroidism of renal origin: Secondary | ICD-10-CM | POA: Diagnosis not present

## 2014-12-28 DIAGNOSIS — D509 Iron deficiency anemia, unspecified: Secondary | ICD-10-CM | POA: Diagnosis not present

## 2014-12-28 DIAGNOSIS — N186 End stage renal disease: Secondary | ICD-10-CM | POA: Diagnosis not present

## 2014-12-30 DIAGNOSIS — N2581 Secondary hyperparathyroidism of renal origin: Secondary | ICD-10-CM | POA: Diagnosis not present

## 2014-12-30 DIAGNOSIS — D509 Iron deficiency anemia, unspecified: Secondary | ICD-10-CM | POA: Diagnosis not present

## 2014-12-30 DIAGNOSIS — N186 End stage renal disease: Secondary | ICD-10-CM | POA: Diagnosis not present

## 2014-12-30 DIAGNOSIS — Z992 Dependence on renal dialysis: Secondary | ICD-10-CM | POA: Diagnosis not present

## 2015-01-02 DIAGNOSIS — D509 Iron deficiency anemia, unspecified: Secondary | ICD-10-CM | POA: Diagnosis not present

## 2015-01-02 DIAGNOSIS — N2581 Secondary hyperparathyroidism of renal origin: Secondary | ICD-10-CM | POA: Diagnosis not present

## 2015-01-02 DIAGNOSIS — Z992 Dependence on renal dialysis: Secondary | ICD-10-CM | POA: Diagnosis not present

## 2015-01-02 DIAGNOSIS — N186 End stage renal disease: Secondary | ICD-10-CM | POA: Diagnosis not present

## 2015-01-04 DIAGNOSIS — N186 End stage renal disease: Secondary | ICD-10-CM | POA: Diagnosis not present

## 2015-01-04 DIAGNOSIS — N2581 Secondary hyperparathyroidism of renal origin: Secondary | ICD-10-CM | POA: Diagnosis not present

## 2015-01-04 DIAGNOSIS — D509 Iron deficiency anemia, unspecified: Secondary | ICD-10-CM | POA: Diagnosis not present

## 2015-01-04 DIAGNOSIS — Z992 Dependence on renal dialysis: Secondary | ICD-10-CM | POA: Diagnosis not present

## 2015-01-06 DIAGNOSIS — N186 End stage renal disease: Secondary | ICD-10-CM | POA: Diagnosis not present

## 2015-01-06 DIAGNOSIS — Z992 Dependence on renal dialysis: Secondary | ICD-10-CM | POA: Diagnosis not present

## 2015-01-06 DIAGNOSIS — N2581 Secondary hyperparathyroidism of renal origin: Secondary | ICD-10-CM | POA: Diagnosis not present

## 2015-01-06 DIAGNOSIS — D509 Iron deficiency anemia, unspecified: Secondary | ICD-10-CM | POA: Diagnosis not present

## 2015-01-09 DIAGNOSIS — N186 End stage renal disease: Secondary | ICD-10-CM | POA: Diagnosis not present

## 2015-01-09 DIAGNOSIS — Z992 Dependence on renal dialysis: Secondary | ICD-10-CM | POA: Diagnosis not present

## 2015-01-09 DIAGNOSIS — N2581 Secondary hyperparathyroidism of renal origin: Secondary | ICD-10-CM | POA: Diagnosis not present

## 2015-01-09 DIAGNOSIS — D509 Iron deficiency anemia, unspecified: Secondary | ICD-10-CM | POA: Diagnosis not present

## 2015-01-10 DIAGNOSIS — L97521 Non-pressure chronic ulcer of other part of left foot limited to breakdown of skin: Secondary | ICD-10-CM | POA: Diagnosis not present

## 2015-01-10 DIAGNOSIS — E1342 Other specified diabetes mellitus with diabetic polyneuropathy: Secondary | ICD-10-CM | POA: Diagnosis not present

## 2015-01-10 DIAGNOSIS — L97511 Non-pressure chronic ulcer of other part of right foot limited to breakdown of skin: Secondary | ICD-10-CM | POA: Diagnosis not present

## 2015-01-11 DIAGNOSIS — D509 Iron deficiency anemia, unspecified: Secondary | ICD-10-CM | POA: Diagnosis not present

## 2015-01-11 DIAGNOSIS — N186 End stage renal disease: Secondary | ICD-10-CM | POA: Diagnosis not present

## 2015-01-11 DIAGNOSIS — Z992 Dependence on renal dialysis: Secondary | ICD-10-CM | POA: Diagnosis not present

## 2015-01-11 DIAGNOSIS — N2581 Secondary hyperparathyroidism of renal origin: Secondary | ICD-10-CM | POA: Diagnosis not present

## 2015-01-13 DIAGNOSIS — N2581 Secondary hyperparathyroidism of renal origin: Secondary | ICD-10-CM | POA: Diagnosis not present

## 2015-01-13 DIAGNOSIS — N186 End stage renal disease: Secondary | ICD-10-CM | POA: Diagnosis not present

## 2015-01-13 DIAGNOSIS — D509 Iron deficiency anemia, unspecified: Secondary | ICD-10-CM | POA: Diagnosis not present

## 2015-01-13 DIAGNOSIS — Z992 Dependence on renal dialysis: Secondary | ICD-10-CM | POA: Diagnosis not present

## 2015-01-16 DIAGNOSIS — N186 End stage renal disease: Secondary | ICD-10-CM | POA: Diagnosis not present

## 2015-01-16 DIAGNOSIS — N2581 Secondary hyperparathyroidism of renal origin: Secondary | ICD-10-CM | POA: Diagnosis not present

## 2015-01-16 DIAGNOSIS — Z992 Dependence on renal dialysis: Secondary | ICD-10-CM | POA: Diagnosis not present

## 2015-01-16 DIAGNOSIS — D509 Iron deficiency anemia, unspecified: Secondary | ICD-10-CM | POA: Diagnosis not present

## 2015-01-18 DIAGNOSIS — N186 End stage renal disease: Secondary | ICD-10-CM | POA: Diagnosis not present

## 2015-01-18 DIAGNOSIS — N2581 Secondary hyperparathyroidism of renal origin: Secondary | ICD-10-CM | POA: Diagnosis not present

## 2015-01-18 DIAGNOSIS — Z992 Dependence on renal dialysis: Secondary | ICD-10-CM | POA: Diagnosis not present

## 2015-01-18 DIAGNOSIS — D509 Iron deficiency anemia, unspecified: Secondary | ICD-10-CM | POA: Diagnosis not present

## 2015-01-19 DIAGNOSIS — M79644 Pain in right finger(s): Secondary | ICD-10-CM | POA: Diagnosis not present

## 2015-01-19 DIAGNOSIS — K219 Gastro-esophageal reflux disease without esophagitis: Secondary | ICD-10-CM | POA: Diagnosis not present

## 2015-01-19 DIAGNOSIS — I10 Essential (primary) hypertension: Secondary | ICD-10-CM | POA: Diagnosis not present

## 2015-01-19 DIAGNOSIS — N186 End stage renal disease: Secondary | ICD-10-CM | POA: Diagnosis not present

## 2015-01-20 DIAGNOSIS — D509 Iron deficiency anemia, unspecified: Secondary | ICD-10-CM | POA: Diagnosis not present

## 2015-01-20 DIAGNOSIS — N2581 Secondary hyperparathyroidism of renal origin: Secondary | ICD-10-CM | POA: Diagnosis not present

## 2015-01-20 DIAGNOSIS — Z992 Dependence on renal dialysis: Secondary | ICD-10-CM | POA: Diagnosis not present

## 2015-01-20 DIAGNOSIS — N186 End stage renal disease: Secondary | ICD-10-CM | POA: Diagnosis not present

## 2015-01-23 DIAGNOSIS — N186 End stage renal disease: Secondary | ICD-10-CM | POA: Diagnosis not present

## 2015-01-23 DIAGNOSIS — N2581 Secondary hyperparathyroidism of renal origin: Secondary | ICD-10-CM | POA: Diagnosis not present

## 2015-01-23 DIAGNOSIS — D509 Iron deficiency anemia, unspecified: Secondary | ICD-10-CM | POA: Diagnosis not present

## 2015-01-23 DIAGNOSIS — Z992 Dependence on renal dialysis: Secondary | ICD-10-CM | POA: Diagnosis not present

## 2015-01-25 DIAGNOSIS — N2581 Secondary hyperparathyroidism of renal origin: Secondary | ICD-10-CM | POA: Diagnosis not present

## 2015-01-25 DIAGNOSIS — N186 End stage renal disease: Secondary | ICD-10-CM | POA: Diagnosis not present

## 2015-01-25 DIAGNOSIS — Z992 Dependence on renal dialysis: Secondary | ICD-10-CM | POA: Diagnosis not present

## 2015-01-25 DIAGNOSIS — D509 Iron deficiency anemia, unspecified: Secondary | ICD-10-CM | POA: Diagnosis not present

## 2015-01-27 DIAGNOSIS — N186 End stage renal disease: Secondary | ICD-10-CM | POA: Diagnosis not present

## 2015-01-27 DIAGNOSIS — N2581 Secondary hyperparathyroidism of renal origin: Secondary | ICD-10-CM | POA: Diagnosis not present

## 2015-01-27 DIAGNOSIS — D509 Iron deficiency anemia, unspecified: Secondary | ICD-10-CM | POA: Diagnosis not present

## 2015-01-27 DIAGNOSIS — Z992 Dependence on renal dialysis: Secondary | ICD-10-CM | POA: Diagnosis not present

## 2015-01-30 DIAGNOSIS — N186 End stage renal disease: Secondary | ICD-10-CM | POA: Diagnosis not present

## 2015-01-30 DIAGNOSIS — N2581 Secondary hyperparathyroidism of renal origin: Secondary | ICD-10-CM | POA: Diagnosis not present

## 2015-01-30 DIAGNOSIS — D509 Iron deficiency anemia, unspecified: Secondary | ICD-10-CM | POA: Diagnosis not present

## 2015-01-30 DIAGNOSIS — Z992 Dependence on renal dialysis: Secondary | ICD-10-CM | POA: Diagnosis not present

## 2015-01-31 DIAGNOSIS — L97511 Non-pressure chronic ulcer of other part of right foot limited to breakdown of skin: Secondary | ICD-10-CM | POA: Diagnosis not present

## 2015-01-31 DIAGNOSIS — L97521 Non-pressure chronic ulcer of other part of left foot limited to breakdown of skin: Secondary | ICD-10-CM | POA: Diagnosis not present

## 2015-01-31 DIAGNOSIS — E1342 Other specified diabetes mellitus with diabetic polyneuropathy: Secondary | ICD-10-CM | POA: Diagnosis not present

## 2015-02-01 DIAGNOSIS — N2581 Secondary hyperparathyroidism of renal origin: Secondary | ICD-10-CM | POA: Diagnosis not present

## 2015-02-01 DIAGNOSIS — Z992 Dependence on renal dialysis: Secondary | ICD-10-CM | POA: Diagnosis not present

## 2015-02-01 DIAGNOSIS — D509 Iron deficiency anemia, unspecified: Secondary | ICD-10-CM | POA: Diagnosis not present

## 2015-02-01 DIAGNOSIS — N186 End stage renal disease: Secondary | ICD-10-CM | POA: Diagnosis not present

## 2015-02-03 DIAGNOSIS — N2581 Secondary hyperparathyroidism of renal origin: Secondary | ICD-10-CM | POA: Diagnosis not present

## 2015-02-03 DIAGNOSIS — Z992 Dependence on renal dialysis: Secondary | ICD-10-CM | POA: Diagnosis not present

## 2015-02-03 DIAGNOSIS — N186 End stage renal disease: Secondary | ICD-10-CM | POA: Diagnosis not present

## 2015-02-03 DIAGNOSIS — D509 Iron deficiency anemia, unspecified: Secondary | ICD-10-CM | POA: Diagnosis not present

## 2015-02-06 DIAGNOSIS — N186 End stage renal disease: Secondary | ICD-10-CM | POA: Diagnosis not present

## 2015-02-06 DIAGNOSIS — I10 Essential (primary) hypertension: Secondary | ICD-10-CM | POA: Diagnosis not present

## 2015-02-06 DIAGNOSIS — N2581 Secondary hyperparathyroidism of renal origin: Secondary | ICD-10-CM | POA: Diagnosis not present

## 2015-02-06 DIAGNOSIS — D509 Iron deficiency anemia, unspecified: Secondary | ICD-10-CM | POA: Diagnosis not present

## 2015-02-06 DIAGNOSIS — Z992 Dependence on renal dialysis: Secondary | ICD-10-CM | POA: Diagnosis not present

## 2015-02-08 DIAGNOSIS — N186 End stage renal disease: Secondary | ICD-10-CM | POA: Diagnosis not present

## 2015-02-08 DIAGNOSIS — N2581 Secondary hyperparathyroidism of renal origin: Secondary | ICD-10-CM | POA: Diagnosis not present

## 2015-02-08 DIAGNOSIS — D509 Iron deficiency anemia, unspecified: Secondary | ICD-10-CM | POA: Diagnosis not present

## 2015-02-08 DIAGNOSIS — Z992 Dependence on renal dialysis: Secondary | ICD-10-CM | POA: Diagnosis not present

## 2015-02-10 DIAGNOSIS — N2581 Secondary hyperparathyroidism of renal origin: Secondary | ICD-10-CM | POA: Diagnosis not present

## 2015-02-10 DIAGNOSIS — D509 Iron deficiency anemia, unspecified: Secondary | ICD-10-CM | POA: Diagnosis not present

## 2015-02-10 DIAGNOSIS — Z992 Dependence on renal dialysis: Secondary | ICD-10-CM | POA: Diagnosis not present

## 2015-02-10 DIAGNOSIS — N186 End stage renal disease: Secondary | ICD-10-CM | POA: Diagnosis not present

## 2015-02-13 DIAGNOSIS — D509 Iron deficiency anemia, unspecified: Secondary | ICD-10-CM | POA: Diagnosis not present

## 2015-02-13 DIAGNOSIS — Z992 Dependence on renal dialysis: Secondary | ICD-10-CM | POA: Diagnosis not present

## 2015-02-13 DIAGNOSIS — N186 End stage renal disease: Secondary | ICD-10-CM | POA: Diagnosis not present

## 2015-02-13 DIAGNOSIS — N2581 Secondary hyperparathyroidism of renal origin: Secondary | ICD-10-CM | POA: Diagnosis not present

## 2015-02-15 DIAGNOSIS — N186 End stage renal disease: Secondary | ICD-10-CM | POA: Diagnosis not present

## 2015-02-15 DIAGNOSIS — N2581 Secondary hyperparathyroidism of renal origin: Secondary | ICD-10-CM | POA: Diagnosis not present

## 2015-02-15 DIAGNOSIS — Z992 Dependence on renal dialysis: Secondary | ICD-10-CM | POA: Diagnosis not present

## 2015-02-15 DIAGNOSIS — D509 Iron deficiency anemia, unspecified: Secondary | ICD-10-CM | POA: Diagnosis not present

## 2015-02-17 DIAGNOSIS — N186 End stage renal disease: Secondary | ICD-10-CM | POA: Diagnosis not present

## 2015-02-17 DIAGNOSIS — Z992 Dependence on renal dialysis: Secondary | ICD-10-CM | POA: Diagnosis not present

## 2015-02-17 DIAGNOSIS — N2581 Secondary hyperparathyroidism of renal origin: Secondary | ICD-10-CM | POA: Diagnosis not present

## 2015-02-17 DIAGNOSIS — D509 Iron deficiency anemia, unspecified: Secondary | ICD-10-CM | POA: Diagnosis not present

## 2015-02-20 DIAGNOSIS — D509 Iron deficiency anemia, unspecified: Secondary | ICD-10-CM | POA: Diagnosis not present

## 2015-02-20 DIAGNOSIS — Z992 Dependence on renal dialysis: Secondary | ICD-10-CM | POA: Diagnosis not present

## 2015-02-20 DIAGNOSIS — N186 End stage renal disease: Secondary | ICD-10-CM | POA: Diagnosis not present

## 2015-02-20 DIAGNOSIS — N2581 Secondary hyperparathyroidism of renal origin: Secondary | ICD-10-CM | POA: Diagnosis not present

## 2015-02-22 DIAGNOSIS — N2581 Secondary hyperparathyroidism of renal origin: Secondary | ICD-10-CM | POA: Diagnosis not present

## 2015-02-22 DIAGNOSIS — N186 End stage renal disease: Secondary | ICD-10-CM | POA: Diagnosis not present

## 2015-02-22 DIAGNOSIS — D509 Iron deficiency anemia, unspecified: Secondary | ICD-10-CM | POA: Diagnosis not present

## 2015-02-22 DIAGNOSIS — Z992 Dependence on renal dialysis: Secondary | ICD-10-CM | POA: Diagnosis not present

## 2015-02-24 DIAGNOSIS — N2581 Secondary hyperparathyroidism of renal origin: Secondary | ICD-10-CM | POA: Diagnosis not present

## 2015-02-24 DIAGNOSIS — N186 End stage renal disease: Secondary | ICD-10-CM | POA: Diagnosis not present

## 2015-02-24 DIAGNOSIS — Z992 Dependence on renal dialysis: Secondary | ICD-10-CM | POA: Diagnosis not present

## 2015-02-24 DIAGNOSIS — D509 Iron deficiency anemia, unspecified: Secondary | ICD-10-CM | POA: Diagnosis not present

## 2015-02-27 DIAGNOSIS — N186 End stage renal disease: Secondary | ICD-10-CM | POA: Diagnosis not present

## 2015-02-27 DIAGNOSIS — N2581 Secondary hyperparathyroidism of renal origin: Secondary | ICD-10-CM | POA: Diagnosis not present

## 2015-02-27 DIAGNOSIS — Z992 Dependence on renal dialysis: Secondary | ICD-10-CM | POA: Diagnosis not present

## 2015-02-27 DIAGNOSIS — D509 Iron deficiency anemia, unspecified: Secondary | ICD-10-CM | POA: Diagnosis not present

## 2015-02-28 ENCOUNTER — Ambulatory Visit (INDEPENDENT_AMBULATORY_CARE_PROVIDER_SITE_OTHER): Payer: Medicare Other | Admitting: Neurology

## 2015-02-28 ENCOUNTER — Encounter: Payer: Self-pay | Admitting: Neurology

## 2015-02-28 ENCOUNTER — Telehealth: Payer: Self-pay | Admitting: Neurology

## 2015-02-28 VITALS — BP 141/77 | HR 103 | Ht 65.0 in | Wt 184.2 lb

## 2015-02-28 DIAGNOSIS — G5600 Carpal tunnel syndrome, unspecified upper limb: Secondary | ICD-10-CM | POA: Insufficient documentation

## 2015-02-28 DIAGNOSIS — G5603 Carpal tunnel syndrome, bilateral upper limbs: Secondary | ICD-10-CM | POA: Diagnosis not present

## 2015-02-28 NOTE — Telephone Encounter (Signed)
Phone call from [facility] [phone], needs order or NCS/EMG. Please fax to [phone].

## 2015-02-28 NOTE — Progress Notes (Signed)
[facility]    Provider:  Dr Jaynee Eagles Referring Provider: [name], MD Primary Care Physician:  [name], MD  CC:  Hand pain  HPI:  [name] is a 74 y.o. male here as a referral from Dr. Legrand Rams for hand pain. PMHx ESRD on HD, HTN. Symptoms are in both hands left>right. Symptoms are in digits 1-4 of the left > right hands. Symptoms started a year ago and worsening. Tingling, numbness and burning in the hands to the wrists. Symptoms are worse at night and wakes him with severe pain, tries to shake it out and "mashes on it" and then it eases off and wakes him up again in a few hours. If he isn't moving around he feels better. Symptoms radiate up into the wrist. No weakness, not dropping objects, no problems opening up jars. Just a lot of pain especially at night. No neck pain or radicular symptoms.  Reviewed notes, labs and imaging from outside physicians, which showed:   Personally reviewed imaging CT of the head 06/2012 and agree with following:  IMPRESSION:  1. No acute intracranial abnormality. 2. Mild chronic microvascular ischemic changes of the white matter.  BMP with creatinine 11.74   Review of Systems: Patient complains of symptoms per HPI as well as the following symptoms: numbness, weakness, joint pain, joint swelling, aching muscles. Pertinent negatives per HPI. All others negative.   Social History   Social History  . Marital Status: Single    Spouse Name: N/A  . Number of Children: 2  . Years of Education: N/A   Occupational History  . retired, [employer]    Social History Main Topics  . Smoking status: Former Smoker    Quit date: 04/29/2004  . Smokeless tobacco: Former Systems developer    Types: [place] date: 01/16/1987     Comment: quit 2006  . Alcohol Use: No  . Drug Use: No  . Sexual Activity: Not on file   Other Topics Concern  . Not on file   Social History Narrative   Lives alone   Daughter 20-min away   Caffeine  use: 32oz soda per day    Family History  Problem Relation Age of Onset  . Colon cancer Neg Hx   . Liver disease Neg Hx     Past Medical History  Diagnosis Date  . Hypertension   . CVA (cerebral infarction)   . Gastric ulcer 2004    treated for h.pylori see PSH  . LVH (left ventricular hypertrophy)   . Stroke ([place])   . ESRD (end stage renal disease) on dialysis ([place])   . Dialysis patient [facility])     Past Surgical History  Procedure Laterality Date  . Cholecystectomy    . Shoulder surgery    . Colonoscopy  2004    Dr. Irving Shows, left sided diverticula and cecal polyp, path unknown  . Esophagogastroduodenoscopy  11/2002    Dr. Gala Romney, erosive reflux esophagitis, multiple gastric ulcer and antral/bulbar erosions. Serologies positive for H.Pylori and was treated  . Esophagogastroduodenoscopy  11/20014    Dr. Gala Romney, small hh only, ulcers healed  . Esophagogastroduodenoscopy  09/21/2011    Dr Trevor Iha [facility], antral erosions, ?early GAVE  . Colonoscopy  10/29/2011    Procedure: COLONOSCOPY;  Surgeon: Daneil Dolin, MD;  [facility];  Service: Endoscopy;  Laterality: N/A;  10:15  . Arteriovenous graft placement Right right arm  . Abdominal aortagram N/A 01/24/2012    Procedure:  ABDOMINAL AORTAGRAM;  Surgeon: Elam Dutch, MD;  Location: [facility] CATH LAB;  Service: Cardiovascular;  Laterality: N/A;    Current Outpatient Prescriptions  Medication Sig Dispense Refill  . amLODipine (NORVASC) 5 MG tablet Take 10 mg by mouth daily.     Marland Kitchen b complex-vitamin c-folic acid (NEPHRO-VITE) 0.8 MG TABS Take 0.8 mg by mouth as directed. Takes on Tuesdays, Thursdays, Saturdays, and Sundays. Does not take on Mondays, Wednesdays, and Fridays due to Dialysis treatments.    . cinacalcet (SENSIPAR) 60 MG tablet Take 60 mg by mouth every evening. With evening meal    . clopidogrel (PLAVIX) 75 MG tablet Take 75 mg by mouth daily.    . metoprolol succinate (TOPROL-XL) 50 MG 24 hr tablet Take 50 mg  by mouth daily.     . promethazine (PHENERGAN) 25 MG tablet Take 12.5 mg by mouth Daily.    . sevelamer (RENVELA) 800 MG tablet Take 2,400-4,000 mg by mouth 3 (three) times daily with meals. Patient takes 5 tablets with meals and 3 tablets with snacks    . ALPRAZolam (XANAX) 0.25 MG tablet Take 0.25 mg by mouth 3 (three) times daily.    Marland Kitchen HYDROcodone-acetaminophen (NORCO/VICODIN) 5-325 MG per tablet Take 1 tablet by mouth every 4 (four) hours as needed for pain. (Patient not taking: Reported on 02/28/2015) 15 tablet 0  . meclizine (ANTIVERT) 25 MG tablet Take 1 tablet (25 mg total) by mouth 3 (three) times daily as needed for dizziness. (Patient not taking: Reported on 02/28/2015) 30 tablet 0   No current facility-administered medications for this visit.    Allergies as of 02/28/2015 - Review Complete 11/14/2014  Allergen Reaction Noted  . Aspirin  10/03/2011    Vitals: BP 141/77 mmHg  Pulse 103  Ht 5\' 5"  (1.651 m)  Wt 184 lb 3.2 oz (83.553 kg)  BMI 30.65 kg/m2 Last Weight:  Wt Readings from Last 1 Encounters:  02/28/15 184 lb 3.2 oz (83.553 kg)   Last Height:   Ht Readings from Last 1 Encounters:  02/28/15 5\' 5"  (1.651 m)    Physical exam: Exam: Gen: NAD, conversant, well nourised, obese, well groomed                     CV: RRR, no MRG. No Carotid Bruits. No peripheral edema, warm, nontender Eyes: Conjunctivae clear without exudates or hemorrhage  Neuro: Detailed Neurologic Exam  Speech:    Speech is normal; fluent and spontaneous with normal comprehension.  Cognition:    The patient is oriented to person, place, and time;     recent and remote memory intact;     language fluent;     normal attention, concentration,     fund of knowledge Cranial Nerves:    The pupils are equal, round, and reactive to light. The fundi are flat. Visual fields are full to finger confrontation. Extraocular movements are intact. Trigeminal sensation is intact and the muscles of mastication  are normal. The face is symmetric. The palate elevates in the midline. Hearing intact. Voice is normal. Shoulder shrug is normal. The tongue has normal motion without fasciculations.   Coordination:    Normal finger to nose and heel to shin.  Gait:    Not ataxic  Motor Observation: No thenar wasting.     No asymmetry, no atrophy, and no involuntary movements noted. No thenar atrophy.  Tone:    Normal muscle tone.    Posture:    Posture is normal. normal  erect    Strength: bilateral deltoid weakness (chronic due to "bad shoulders")    Strength is V/V in the upper and lower limbs. Intact strength of the intrinsic hand muscles.     Sensation: intact to LT     Reflex Exam:  DTR's:    Deep tendon reflexes in the upper and lower extremities are symetrical bilaterally.   Toes:    The toes are downgoing bilaterally.   Clonus:    Clonus is absent.   Negative Phalen's Maneuver, +Tinel's sign    Assessment/Plan:   74 y.o. male here as a referral from Dr. Legrand Rams for hand pain. PMHx ESRD on HD, HTN. 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   EMG/NCS bilateral upper extremities,  likely CTS Conservative measures, Wrist splints at night , no activities that can exacerbate compression of the median nerve at the wrist, Tylenol prn for the pain If emg/ncs shows CTS, will refer to hand surgeon.    Sarina Ill, MD  [facility] [address] [place], [place] [address]  Phone [phone] Fax [phone]

## 2015-02-28 NOTE — Telephone Encounter (Signed)
[name] KitchenMarland Kitchenplease note

## 2015-02-28 NOTE — Telephone Encounter (Signed)
Faxed order for EMG/NCS as requested. Received fax confirmation.

## 2015-02-28 NOTE — Patient Instructions (Signed)
Overall you are doing fairly well but I do want to suggest a few things today:   Remember to drink plenty of fluid, eat healthy meals and do not skip any meals. Try to eat protein with a every meal and eat a healthy snack such as fruit or nuts in between meals. Try to keep a regular sleep-wake schedule and try to exercise daily, particularly in the form of walking, 20-30 minutes a day, if you can.   As far as your medications are concerned, I would like to suggest: Tylenol as needed  Bilateral wrist splints  As far as diagnostic testing: EMG/NCS of the arms/hands  I would like to see you back after results, sooner if we need to. Please call us with any interim questions, concerns, problems, updates or refill requests.   Please also call us for any test results so we can go over those with you on the phone.  My clinical assistant and will answer any of your questions and relay your messages to me and also relay most of my messages to you.   Our phone number is [phone]. We also have an after hours call service for urgent matters and there is a physician on-call for urgent questions. For any emergencies you know to call 911 or go to the nearest emergency room

## 2015-03-01 DIAGNOSIS — N2581 Secondary hyperparathyroidism of renal origin: Secondary | ICD-10-CM | POA: Diagnosis not present

## 2015-03-01 DIAGNOSIS — Z992 Dependence on renal dialysis: Secondary | ICD-10-CM | POA: Diagnosis not present

## 2015-03-01 DIAGNOSIS — N186 End stage renal disease: Secondary | ICD-10-CM | POA: Diagnosis not present

## 2015-03-03 DIAGNOSIS — N186 End stage renal disease: Secondary | ICD-10-CM | POA: Diagnosis not present

## 2015-03-03 DIAGNOSIS — Z992 Dependence on renal dialysis: Secondary | ICD-10-CM | POA: Diagnosis not present

## 2015-03-03 DIAGNOSIS — N2581 Secondary hyperparathyroidism of renal origin: Secondary | ICD-10-CM | POA: Diagnosis not present

## 2015-03-04 ENCOUNTER — Encounter (HOSPITAL_COMMUNITY): Payer: Self-pay

## 2015-03-04 ENCOUNTER — Emergency Department (HOSPITAL_COMMUNITY)
Admission: EM | Admit: 2015-03-04 | Discharge: 2015-03-05 | Disposition: A | Discharge status: Home / Self Care | Payer: Medicare Other | Attending: Emergency Medicine | Admitting: Emergency Medicine

## 2015-03-04 ENCOUNTER — Emergency Department (HOSPITAL_COMMUNITY): Payer: Medicare Other

## 2015-03-04 DIAGNOSIS — R7989 Other specified abnormal findings of blood chemistry: Secondary | ICD-10-CM | POA: Diagnosis not present

## 2015-03-04 DIAGNOSIS — I120 Hypertensive chronic kidney disease with stage 5 chronic kidney disease or end stage renal disease: Secondary | ICD-10-CM | POA: Insufficient documentation

## 2015-03-04 DIAGNOSIS — N186 End stage renal disease: Secondary | ICD-10-CM | POA: Diagnosis not present

## 2015-03-04 DIAGNOSIS — Z7902 Long term (current) use of antithrombotics/antiplatelets: Secondary | ICD-10-CM | POA: Insufficient documentation

## 2015-03-04 DIAGNOSIS — Z79899 Other long term (current) drug therapy: Secondary | ICD-10-CM | POA: Diagnosis not present

## 2015-03-04 DIAGNOSIS — Z8673 Personal history of transient ischemic attack (TIA), and cerebral infarction without residual deficits: Secondary | ICD-10-CM | POA: Insufficient documentation

## 2015-03-04 DIAGNOSIS — Z87891 Personal history of nicotine dependence: Secondary | ICD-10-CM | POA: Insufficient documentation

## 2015-03-04 DIAGNOSIS — R5383 Other fatigue: Secondary | ICD-10-CM | POA: Insufficient documentation

## 2015-03-04 DIAGNOSIS — R269 Unspecified abnormalities of gait and mobility: Secondary | ICD-10-CM | POA: Diagnosis not present

## 2015-03-04 DIAGNOSIS — R011 Cardiac murmur, unspecified: Secondary | ICD-10-CM | POA: Insufficient documentation

## 2015-03-04 DIAGNOSIS — Z8719 Personal history of other diseases of the digestive system: Secondary | ICD-10-CM | POA: Diagnosis not present

## 2015-03-04 DIAGNOSIS — Z992 Dependence on renal dialysis: Secondary | ICD-10-CM | POA: Insufficient documentation

## 2015-03-04 DIAGNOSIS — R42 Dizziness and giddiness: Secondary | ICD-10-CM | POA: Diagnosis not present

## 2015-03-04 DIAGNOSIS — R778 Other specified abnormalities of plasma proteins: Secondary | ICD-10-CM

## 2015-03-04 LAB — BASIC METABOLIC PANEL
Anion gap: 13 (ref 5–15)
BUN: 22 mg/dL — ABNORMAL HIGH (ref 6–20)
CO2: 23 mmol/L (ref 22–32)
Calcium: 8.9 mg/dL (ref 8.9–10.3)
Chloride: 96 mmol/L — ABNORMAL LOW (ref 101–111)
Creatinine, Ser: 9.89 mg/dL — ABNORMAL HIGH (ref 0.61–1.24)
GFR calc Af Amer: 5 mL/min — ABNORMAL LOW (ref >60)
GFR calc non Af Amer: 5 mL/min — ABNORMAL LOW (ref >60)
Glucose, Bld: 97 mg/dL (ref 65–99)
Potassium: 3.7 mmol/L (ref 3.5–5.1)
Sodium: 132 mmol/L — ABNORMAL LOW (ref 135–145)

## 2015-03-04 MED ORDER — MECLIZINE HCL 12.5 MG PO TABS
25.0000 mg | ORAL_TABLET | Freq: Once | ORAL | Status: AC
  Administered 2015-03-04: 25 mg via ORAL
  Filled 2015-03-04: qty 2

## 2015-03-04 NOTE — ED Provider Notes (Signed)
CSN: [id number]     Arrival date & time 03/04/15  2219 History  By signing my name below, I, [name], attest that this documentation has been prepared under the direction and in the presence of [name], MD at 2307. Electronically Signed: Meriel [name], ED Scribe. 03/04/2015. 12:32 AM.   Chief Complaint  Patient presents with  . Dizziness   The history is provided by the patient. No language interpreter was used.   HPI Comments: [name] is a 74 y.o. 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 Pt states he 'just feels bad' and he has never felt this way before. He associates feeling off balance when walking and swaying from side to side but he has not fallen, a pre-syncopal feeling, and light-headedness with standing up. He notes no modifying factors to his symptoms. Pt denies any similar experiences in the past, new medications, or new activities. Also denies any fever or chills, any pain, nausea, vomiting, SOB, room-spinning dizziness or syncope,rectal bleeding, melena, or numbness or tingling in extremities. Pt is taking Plavix but no longer taking daily Aspirin. He is prescribed meclizine for dizziness but he does not associate any falls or this dizziness to be a room-spinning dizziness.   Pt has ESRD and gets dialysis on M, W, F.   PCP: Dr. Legrand Rams.   Past Medical History  Diagnosis Date  . Hypertension   . CVA (cerebral infarction)   . Gastric ulcer 2004    treated for h.pylori see PSH  . LVH (left ventricular hypertrophy)   . Stroke ([place])   . ESRD (end stage renal disease) on dialysis ([place])   . Dialysis patient [facility])    Past Surgical History  Procedure Laterality Date  . Cholecystectomy    . Shoulder surgery    . Colonoscopy  2004    Dr. Irving Shows, left sided diverticula and cecal polyp,  path unknown  . Esophagogastroduodenoscopy  11/2002    Dr. Gala Romney, erosive reflux esophagitis, multiple gastric ulcer and antral/bulbar erosions. Serologies positive for H.Pylori and was treated  . Esophagogastroduodenoscopy  11/20014    Dr. Gala Romney, small hh only, ulcers healed  . Esophagogastroduodenoscopy  09/21/2011    Dr Trevor Iha [facility], antral erosions, ?early GAVE  . Colonoscopy  10/29/2011    Procedure: COLONOSCOPY;  Surgeon: Daneil Dolin, MD;  Location: [facility];  Service: Endoscopy;  Laterality: N/A;  10:15  . Arteriovenous graft placement Right right arm  . Abdominal aortagram N/A 01/24/2012    Procedure: ABDOMINAL Maxcine Ham;  Surgeon: Elam Dutch, MD;  Location[facility] CATH LAB;  Service: Cardiovascular;  Laterality: N/A;   Family History  Problem Relation Age of Onset  . Colon cancer Neg Hx   . Liver disease Neg Hx    Social History  Substance Use Topics  . Smoking status: Former Smoker    Quit date: 04/29/2004  . Smokeless tobacco: Former Systems developer    Types: [place] date: 01/16/1987     Comment: quit 2006  . Alcohol Use: No  lives at home Lives alone  Review of Systems  Constitutional: Positive for fatigue. Negative for fever and chills.  Respiratory: Negative for shortness of breath.   Cardiovascular: Negative for chest pain.  Gastrointestinal: Negative for nausea, vomiting, abdominal pain and blood in stool.  Musculoskeletal: Positive for gait problem ( off balance, swaying ). Negative for back pain.  Neurological: Positive for light-headedness. Negative for dizziness, syncope, weakness and numbness.  Hematological: Bruises/bleeds easily.  All other systems reviewed and are negative.  Allergies  Aspirin  Home Medications   Prior to Admission medications   Medication Sig Start Date End Date Taking? Authorizing Provider  ALPRAZolam (XANAX) 0.25 MG tablet Take 0.25 mg by mouth 3 (three) times daily.   Yes Historical Provider, MD  amLODipine (NORVASC) 10 MG  tablet Take 10 mg by mouth daily.   Yes Historical Provider, MD  b complex-vitamin c-folic acid (NEPHRO-VITE) 0.8 MG TABS Take 0.8 mg by mouth as directed. Takes on Tuesdays, Thursdays, Saturdays, and Sundays. Does not take on Mondays, Wednesdays, and Fridays due to Dialysis treatments.   Yes Historical Provider, MD  cinacalcet (SENSIPAR) 60 MG tablet Take 60 mg by mouth every evening. With evening meal   Yes Historical Provider, MD  clopidogrel (PLAVIX) 75 MG tablet Take 75 mg by mouth daily. 07/26/10  Yes Historical Provider, MD  metoprolol succinate (TOPROL-XL) 50 MG 24 hr tablet Take 50 mg by mouth daily.    Yes Historical Provider, MD  sevelamer (RENVELA) 800 MG tablet Take 2,400-4,000 mg by mouth 3 (three) times daily with meals. Patient takes 5 tablets with meals and 3 tablets with snacks   Yes Historical Provider, MD  HYDROcodone-acetaminophen (NORCO/VICODIN) 5-325 MG per tablet Take 1 tablet by mouth every 4 (four) hours as needed for pain. Patient not taking: Reported on 02/28/2015 07/12/12   Prentiss Bells, MD  meclizine (ANTIVERT) 25 MG tablet Take 1 tablet (25 mg total) by mouth 3 (three) times daily as needed for dizziness. 03/05/15   [name], MD  promethazine (PHENERGAN) 25 MG tablet Take 25 mg by mouth daily.  07/22/11   Historical Provider, MD   BP 148/87 mmHg  Pulse 101  Temp(Src) 97.7 F (36.5 C) (Oral)  Resp 20  Ht 5\' 5"  (1.651 m)  Wt 184 lb (83.462 kg)  BMI 30.62 kg/m2  SpO2 100%  Vital signs normal except for borderline tachycardia  Physical Exam  Constitutional: He is oriented to person, place, and time. He appears well-developed and well-nourished.  Non-toxic appearance. He does not appear ill. No distress.  HENT:  Head: Normocephalic and atraumatic.  Right Ear: External ear normal.  Left Ear: External ear normal.  Nose: Nose normal. No mucosal edema or rhinorrhea.  Mouth/Throat: Oropharynx is clear and moist and mucous membranes are normal. No dental abscesses or  uvula swelling.  Eyes: Conjunctivae and EOM are normal. Pupils are equal, round, and reactive to light.  Neck: Normal range of motion and full passive range of motion without pain. Neck supple.  Cardiovascular: Normal rate and regular rhythm.  Exam reveals no gallop and no friction rub.   Murmur heard. Grade II systolic murmur.   Pulmonary/Chest: Effort normal and breath sounds normal. No respiratory distress. He has no wheezes. He has no rhonchi. He has no rales. He exhibits no tenderness and no crepitus.  Abdominal: Soft. Normal appearance and bowel sounds are normal. He exhibits no distension. There is no tenderness. There is no rebound and no guarding.  Musculoskeletal: Normal range of motion. He exhibits no edema or tenderness.  Moves all extremities well.  Neurological: He is alert and oriented to person, place, and time. He has normal strength. No cranial nerve deficit.  Pt is right-handed, grip strength is minimally weak on the left. Limited  abduction of shoulders but no gross pronator drift, no distal motor weakness. FTN intact bilaterally.   Skin: Skin is warm, dry and intact. No rash noted. No erythema. No pallor.  Psychiatric: He has a normal mood and affect. His speech is normal and behavior is normal. His mood appears not anxious.  Nursing note and vitals reviewed.   ED Course  Procedures   Medications  meclizine (ANTIVERT) tablet 25 mg (25 mg Oral Given 03/04/15 2325)    DIAGNOSTIC STUDIES: Oxygen Saturation is 100% on RA, normal by my interpretation.    COORDINATION OF CARE: 11:19 PM Discussed treatment plan with pt at bedside and pt agreed to plan.  12:49 AM  Discussed test results and pt is feeling better after the meclizine. Will get a repeat troponin at 1:30 AM.   00:32 Orthostatic Vital Signs AH  Orthostatic Lying  - BP- Lying: 138/83 mmHg ; Pulse- Lying: 93  Orthostatic Sitting - BP- Sitting: 152/79 mmHg ; Pulse- Sitting: 92  Orthostatic Standing at 0 minutes -  BP- Standing at 0 minutes: 122/79 mmHg ; Pulse- Standing at 0 minutes: 93    Negative orthostatics   1:43 AM patient's second troponin resulted in is basically the same as the first. The elevated troponin may be due to his chronic renal disease. He was discharged home feeling improved.  Labs Review Results for orders placed or performed during the hospital encounter of 03/04/15  CBC with Differential  Result Value Ref Range   WBC 8.8 4.0 - 10.5 K/uL   RBC 6.75 (H) 4.22 - 5.81 MIL/uL   Hemoglobin 14.5 13.0 - 17.0 g/dL   HCT 43.1 39.0 - 52.0 %   MCV 63.9 (L) 78.0 - 100.0 fL   MCH 21.5 (L) 26.0 - 34.0 pg   MCHC 33.6 30.0 - 36.0 g/dL   RDW 21.0 (H) 11.5 - 15.5 %   Platelets 294 150 - 400 K/uL   Neutrophils Relative % 55 %   Lymphocytes Relative 26 %   Monocytes Relative 10 %   Eosinophils Relative 9 %   Basophils Relative 0 %   Band Neutrophils 0 %   Metamyelocytes Relative 0 %   Myelocytes 0 %   Promyelocytes Absolute 0 %   Blasts 0 %   nRBC 0 0 /100 WBC   Other 0 %   Neutro Abs 4.8 1.7 - 7.7 K/uL   Lymphs Abs 2.3 0.7 - 4.0 K/uL   Monocytes Absolute 0.9 0.1 - 1.0 K/uL   Eosinophils Absolute 0.8 (H) 0.0 - 0.7 K/uL   Basophils Absolute 0.0 0.0 - 0.1 K/uL  Basic metabolic panel  Result Value Ref Range   Sodium 132 (L) 135 - 145 mmol/L   Potassium 3.7 3.5 - 5.1 mmol/L   Chloride 96 (L) 101 - 111 mmol/L   CO2 23 22 - 32 mmol/L   Glucose, Bld 97 65 - 99 mg/dL   BUN 22 (H) 6 - 20 mg/dL   Creatinine, Ser 9.89 (H) 0.61 - 1.24 mg/dL   Calcium 8.9 8.9 - 10.3 mg/dL   GFR calc non Af Amer 5 (L) >60 mL/min   GFR calc Af Amer 5 (L) >60 mL/min   Anion gap 13 5 - 15  Troponin I  Result Value Ref Range   Troponin I 0.06 (H) <0.031 ng/mL  Troponin I  Result Value Ref Range   Troponin I 0.07 (H) <0.031 ng/mL   Laboratory interpretation all normal except positive troponin, chronic renal failure  Imaging Review Ct Head Wo Contrast  03/05/2015  CLINICAL DATA:  Fatigue,  feeling bad, gait imbalance with walking. History of hypertension, stroke, end-stage renal disease on dialysis. EXAM: CT HEAD WITHOUT CONTRAST TECHNIQUE: Contiguous axial images were obtained from the base of the skull through the vertex without intravenous contrast. COMPARISON:  CT head July 13, 2012. FINDINGS: The ventricles and sulci are normal for age. No intraparenchymal hemorrhage, mass effect nor midline shift. Patchy supratentorial white matter hypodensities are less than expected for patient's age and though non-specific suggest sequelae of chronic small vessel ischemic disease. No acute large vascular territory infarcts. Small area RIGHT frontal encephalomalacia No abnormal extra-axial fluid collections. Basal cisterns are patent. Mild calcific atherosclerosis of the carotid siphons and included vertebral arteries. Calcific atherosclerosis of RIGHT MCA. No skull fracture. The included ocular globes and orbital contents are non-suspicious. The mastoid aircells and included paranasal sinuses are well-aerated. IMPRESSION: No acute intracranial process. Small area RIGHT frontal encephalomalacia may be posttraumatic or ischemic. Mild chronic small vessel ischemic disease. Electronically Signed   By: Elon Alas M.D.   On: 03/05/2015 00:25   I have personally reviewed and evaluated these images and lab results as part of my medical decision-making.   EKG Interpretation   Date/Time:  Saturday March 04 2015 22:37:11 EDT Ventricular Rate:  96 PR Interval:  165 QRS Duration: 128 QT Interval:  418 QTC Calculation: 528 R Axis:   -27 Text Interpretation:  Sinus rhythm Right bundle branch block Non-specific  ST-t changes Confirmed by Wilson Singer  MD, STEPHEN ([id number]) on 03/04/2015 10:50:22  PM      MDM   Final diagnoses:  Dizziness  Troponin level elevated  ESRD on hemodialysis (HCC)   New Prescriptions   MECLIZINE (ANTIVERT) 25 MG TABLET    Take 1 tablet (25 mg total) by mouth 3 (three)  times daily as needed for dizziness.    Plan discharge  [name], MD, FACEP  I personally performed the services described in this documentation, which was scribed in my presence. The recorded information has been reviewed and considered.  [name], MD, Barbette Or, MD 03/05/15 [phone]

## 2015-03-04 NOTE — ED Notes (Signed)
Patient states that he has been feeling dizzy and off balance all day long. Does not feel like the room is spinning. Denies visual changes, headaches, or weakness.

## 2015-03-05 DIAGNOSIS — R42 Dizziness and giddiness: Secondary | ICD-10-CM | POA: Diagnosis not present

## 2015-03-05 DIAGNOSIS — R269 Unspecified abnormalities of gait and mobility: Secondary | ICD-10-CM | POA: Diagnosis not present

## 2015-03-05 DIAGNOSIS — R5383 Other fatigue: Secondary | ICD-10-CM | POA: Diagnosis not present

## 2015-03-05 LAB — CBC WITH DIFFERENTIAL/PLATELET
Band Neutrophils: 0 %
Basophils Absolute: 0 10*3/uL (ref 0.0–0.1)
Basophils Relative: 0 %
Blasts: 0 %
Eosinophils Absolute: 0.8 10*3/uL — ABNORMAL HIGH (ref 0.0–0.7)
Eosinophils Relative: 9 %
HCT: 43.1 % (ref 39.0–52.0)
Hemoglobin: 14.5 g/dL (ref 13.0–17.0)
Lymphocytes Relative: 26 %
Lymphs Abs: 2.3 10*3/uL (ref 0.7–4.0)
MCH: 21.5 pg — ABNORMAL LOW (ref 26.0–34.0)
MCHC: 33.6 g/dL (ref 30.0–36.0)
MCV: 63.9 fL — ABNORMAL LOW (ref 78.0–100.0)
Metamyelocytes Relative: 0 %
Monocytes Absolute: 0.9 10*3/uL (ref 0.1–1.0)
Monocytes Relative: 10 %
Myelocytes: 0 %
Neutro Abs: 4.8 10*3/uL (ref 1.7–7.7)
Neutrophils Relative %: 55 %
Other: 0 %
Platelets: 294 10*3/uL (ref 150–400)
Promyelocytes Absolute: 0 %
RBC: 6.75 MIL/uL — ABNORMAL HIGH (ref 4.22–5.81)
RDW: 21 % — ABNORMAL HIGH (ref 11.5–15.5)
WBC: 8.8 10*3/uL (ref 4.0–10.5)
nRBC: 0 /100 WBC

## 2015-03-05 LAB — TROPONIN I
Troponin I: 0.06 ng/mL — ABNORMAL HIGH (ref <0.031)
Troponin I: 0.07 ng/mL — ABNORMAL HIGH (ref <0.031)

## 2015-03-05 MED ORDER — MECLIZINE HCL 25 MG PO TABS: 25.0000 mg | ORAL_TABLET | Freq: Three times a day (TID) | ORAL | Status: DC | PRN

## 2015-03-05 NOTE — Discharge Instructions (Signed)
Take the meclizine as needed for symptoms of dizziness. Recheck if you get worse. Keep your dialysis appointment on Monday, Nov. 7th. Recheck if you get worse.

## 2015-03-05 NOTE — ED Notes (Signed)
Patient is on Dialysis not able to urinate.

## 2015-03-06 DIAGNOSIS — N186 End stage renal disease: Secondary | ICD-10-CM | POA: Diagnosis not present

## 2015-03-06 DIAGNOSIS — N2581 Secondary hyperparathyroidism of renal origin: Secondary | ICD-10-CM | POA: Diagnosis not present

## 2015-03-06 DIAGNOSIS — Z992 Dependence on renal dialysis: Secondary | ICD-10-CM | POA: Diagnosis not present

## 2015-03-07 DIAGNOSIS — L97511 Non-pressure chronic ulcer of other part of right foot limited to breakdown of skin: Secondary | ICD-10-CM | POA: Diagnosis not present

## 2015-03-07 DIAGNOSIS — E1342 Other specified diabetes mellitus with diabetic polyneuropathy: Secondary | ICD-10-CM | POA: Diagnosis not present

## 2015-03-07 DIAGNOSIS — L97521 Non-pressure chronic ulcer of other part of left foot limited to breakdown of skin: Secondary | ICD-10-CM | POA: Diagnosis not present

## 2015-03-08 DIAGNOSIS — Z992 Dependence on renal dialysis: Secondary | ICD-10-CM | POA: Diagnosis not present

## 2015-03-08 DIAGNOSIS — N186 End stage renal disease: Secondary | ICD-10-CM | POA: Diagnosis not present

## 2015-03-08 DIAGNOSIS — N2581 Secondary hyperparathyroidism of renal origin: Secondary | ICD-10-CM | POA: Diagnosis not present

## 2015-03-10 DIAGNOSIS — Z992 Dependence on renal dialysis: Secondary | ICD-10-CM | POA: Diagnosis not present

## 2015-03-10 DIAGNOSIS — N186 End stage renal disease: Secondary | ICD-10-CM | POA: Diagnosis not present

## 2015-03-10 DIAGNOSIS — N2581 Secondary hyperparathyroidism of renal origin: Secondary | ICD-10-CM | POA: Diagnosis not present

## 2015-03-13 DIAGNOSIS — N2581 Secondary hyperparathyroidism of renal origin: Secondary | ICD-10-CM | POA: Diagnosis not present

## 2015-03-13 DIAGNOSIS — N186 End stage renal disease: Secondary | ICD-10-CM | POA: Diagnosis not present

## 2015-03-13 DIAGNOSIS — Z992 Dependence on renal dialysis: Secondary | ICD-10-CM | POA: Diagnosis not present

## 2015-03-15 DIAGNOSIS — N2581 Secondary hyperparathyroidism of renal origin: Secondary | ICD-10-CM | POA: Diagnosis not present

## 2015-03-15 DIAGNOSIS — Z992 Dependence on renal dialysis: Secondary | ICD-10-CM | POA: Diagnosis not present

## 2015-03-15 DIAGNOSIS — N186 End stage renal disease: Secondary | ICD-10-CM | POA: Diagnosis not present

## 2015-03-16 ENCOUNTER — Ambulatory Visit (INDEPENDENT_AMBULATORY_CARE_PROVIDER_SITE_OTHER): Payer: Medicare Other | Admitting: Neurology

## 2015-03-16 DIAGNOSIS — G5603 Carpal tunnel syndrome, bilateral upper limbs: Secondary | ICD-10-CM | POA: Diagnosis not present

## 2015-03-16 DIAGNOSIS — G63 Polyneuropathy in diseases classified elsewhere: Secondary | ICD-10-CM

## 2015-03-16 NOTE — Procedures (Signed)
[facility]  [address]  [place], [place] [address] Tel: [phone] Fax:  [phone] Test Date:  03/16/2015  Patient: [name] DOB: November 02, 1940 Physician: Narda Amber  Sex: Male Height: 5\' 5"  Ref Phys: Acie Fredrickson, MD  ID#: [id number] Temp: 33.1C Technician: Jerilynn Mages. Dean   Patient Complaints: This is a 74 year old gentleman with end-stage renal disease and hypertension referred for evaluation of bilateral hand paresthesias, worse on the left.  NCV & EMG Findings: Extensive electrodiagnostic testing of the left upper extremity and additional studies of the right shows: 1. Nearly all sensory responses are absent including bilateral median, bilateral ulnar, and right radial sensory nerves. The left radial sensory response shows reduced amplitude (8.0 V).   2. Bilateral median motor responses showed no motor response. The right ulnar motor response is borderline reduced in amplitude (6.7 mV) with borderline conduction velocity slowing across the forearm (B Elbow-Wrist, 49 m/s); of note, he does have been arteriovenous fistula in this extremity. Left ulnar motor responses within normal limits. 3. Despite maximal activation, no motor unit recruitment was seen in the abductor pollicis brevis muscles bilaterally, where there is also severe muscle atrophy. Chronic motor axon loss changes are seen affecting the distal hand muscles bilaterally, without accompanied active denervation.  Impression: 1. The electrophysiologic findings are most consistent with a severe and chronic sensorimotor polyneuropathy, axon loss in type, affecting the upper extremities. 2. 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   _____________________________ Narda Amber, D.O.    Nerve Conduction  Studies Anti Sensory Summary Table   Stim Site NR Peak (ms) Norm Peak (ms) P-T Amp (V) Norm P-T Amp  Left Median Anti Sensory (2nd Digit)  Wrist NR  <3.8  >10  Right Median Anti Sensory (2nd Digit)  Wrist NR  <3.8  >10  Left Radial Anti Sensory (Base 1st Digit)  33.1C  Wrist    2.7 <2.8 8.0 >10  Right Radial Anti Sensory (Base 1st Digit)  33.1C  Wrist NR  <2.8  >10  Left Ulnar Anti Sensory (5th Digit)  Wrist NR  <3.2  >5  Right Ulnar Anti Sensory (5th Digit)  Wrist NR  <3.2  >5   Motor Summary Table   Stim Site NR Onset (ms) Norm Onset (ms) O-P Amp (mV) Norm O-P Amp Site1 Site2 Delta-0 (ms) Dist (cm) Vel (m/s) Norm Vel (m/s)  Left Median Motor (Abd Poll Brev)  33.1C  Wrist NR  <4.0  >5 Elbow Wrist  31.0  >50  Elbow NR            Right Median Motor (Abd Poll Brev)  Wrist NR  <4.0  >5 Elbow Wrist  0.0  >50  Elbow NR            Left Ulnar Motor (Abd Dig Minimi)  Wrist    2.9 <3.1 7.0 >7 B Elbow Wrist 4.9 25.0 51 >50  B Elbow    7.8  6.0  A Elbow B Elbow 1.7 10.0 59 >50  A Elbow    9.5  5.5         Right Ulnar Motor (Abd Dig Minimi)  Wrist    3.0 <3.1 6.7 >7 B Elbow Wrist 5.3 26.0 49 >50  B Elbow    8.3  5.5  A Elbow B Elbow 1.9  10.0 53 >50  A Elbow    10.2  5.3          EMG   Side Muscle Ins Act Fibs Psw Fasc Number Recrt Dur Dur. Amp Amp. Poly Poly. Comment  Right 1stDorInt Nml Nml Nml Nml 2- Rapid Some 1+ Some 1+ Some 1+ N/A  Right Abd Poll Brev Nml Nml Nml Nml [facility] None - - - - - - ATR  Right Ext Indicis Nml Nml Nml Nml 2- Rapid Some 1+ Some 1+ Some 1+ N/A  Right PronatorTeres Nml Nml Nml Nml Nml Nml Nml Nml Nml Nml Nml Nml N/A  Right Triceps Nml Nml Nml Nml Nml Nml Nml Nml Nml Nml Nml Nml N/A  Right Deltoid Nml Nml Nml Nml Nml Nml Nml Nml Nml Nml Nml Nml N/A  Right ABD Dig Min Nml Nml Nml Nml 2- Rapid Some 1+ Some 1+ Nml Nml N/A  Left Deltoid Nml Nml Nml Nml Nml Nml Nml Nml Nml Nml Nml Nml N/A  Left 1stDorInt Nml Nml Nml Nml 1- Mod-R Few 1+ Few 1+ Nml Nml N/A  Left  Abd Poll Brev Nml Nml Nml Nml NE None - - - - - - ATR  Left Ext Indicis Nml Nml Nml Nml 1- Rapid Some 1+ Some 1+ Few 1+ N/A  Left PronatorTeres Nml Nml Nml Nml Nml Nml Nml Nml Nml Nml Nml Nml N/A  Left Biceps Nml Nml Nml Nml Nml Nml Nml Nml Nml Nml Nml Nml N/A  Left Triceps Nml Nml Nml Nml Nml Nml Nml Nml Nml Nml Nml Nml N/A  Left ABD Dig Min Nml Nml Nml Nml 2- Rapid Some 1+ Some 1+ Few 1+ N/A  Left FlexPolLong Nml Nml Nml Nml 1- Mod-R Few 1+ Few 1+ Nml Nml N/A      Waveforms:

## 2015-03-17 DIAGNOSIS — N2581 Secondary hyperparathyroidism of renal origin: Secondary | ICD-10-CM | POA: Diagnosis not present

## 2015-03-17 DIAGNOSIS — N186 End stage renal disease: Secondary | ICD-10-CM | POA: Diagnosis not present

## 2015-03-17 DIAGNOSIS — Z992 Dependence on renal dialysis: Secondary | ICD-10-CM | POA: Diagnosis not present

## 2015-03-20 ENCOUNTER — Telehealth: Payer: Self-pay | Admitting: Neurology

## 2015-03-20 DIAGNOSIS — Z992 Dependence on renal dialysis: Secondary | ICD-10-CM | POA: Diagnosis not present

## 2015-03-20 DIAGNOSIS — N186 End stage renal disease: Secondary | ICD-10-CM | POA: Diagnosis not present

## 2015-03-20 DIAGNOSIS — N2581 Secondary hyperparathyroidism of renal origin: Secondary | ICD-10-CM | POA: Diagnosis not present

## 2015-03-20 DIAGNOSIS — G5603 Carpal tunnel syndrome, bilateral upper limbs: Secondary | ICD-10-CM

## 2015-03-20 NOTE — Telephone Encounter (Signed)
Patient with bilateral CTS. Will place referral to hand surgery. See result note.

## 2015-03-20 NOTE — Telephone Encounter (Signed)
Called and relayed Dr Jaynee Eagles message below. Pt verbalized understanding.  He knows to expect a call to schedule appt with hand surgeon.

## 2015-03-21 DIAGNOSIS — E1342 Other specified diabetes mellitus with diabetic polyneuropathy: Secondary | ICD-10-CM | POA: Diagnosis not present

## 2015-03-21 DIAGNOSIS — L97521 Non-pressure chronic ulcer of other part of left foot limited to breakdown of skin: Secondary | ICD-10-CM | POA: Diagnosis not present

## 2015-03-21 DIAGNOSIS — L97511 Non-pressure chronic ulcer of other part of right foot limited to breakdown of skin: Secondary | ICD-10-CM | POA: Diagnosis not present

## 2015-03-22 DIAGNOSIS — Z992 Dependence on renal dialysis: Secondary | ICD-10-CM | POA: Diagnosis not present

## 2015-03-22 DIAGNOSIS — N186 End stage renal disease: Secondary | ICD-10-CM | POA: Diagnosis not present

## 2015-03-22 DIAGNOSIS — N2581 Secondary hyperparathyroidism of renal origin: Secondary | ICD-10-CM | POA: Diagnosis not present

## 2015-03-24 DIAGNOSIS — N186 End stage renal disease: Secondary | ICD-10-CM | POA: Diagnosis not present

## 2015-03-24 DIAGNOSIS — N2581 Secondary hyperparathyroidism of renal origin: Secondary | ICD-10-CM | POA: Diagnosis not present

## 2015-03-24 DIAGNOSIS — Z992 Dependence on renal dialysis: Secondary | ICD-10-CM | POA: Diagnosis not present

## 2015-03-27 DIAGNOSIS — N186 End stage renal disease: Secondary | ICD-10-CM | POA: Diagnosis not present

## 2015-03-27 DIAGNOSIS — N2581 Secondary hyperparathyroidism of renal origin: Secondary | ICD-10-CM | POA: Diagnosis not present

## 2015-03-27 DIAGNOSIS — Z992 Dependence on renal dialysis: Secondary | ICD-10-CM | POA: Diagnosis not present

## 2015-03-28 NOTE — Telephone Encounter (Signed)
Patient called to get test results from test that was done with hand surgeon. Please call [phone].

## 2015-03-28 NOTE — Telephone Encounter (Signed)
Called pt back. Relayed results of EMG/NCS per Dr Jaynee Eagles notes again. Advised he needs to call [phone] to schedule appt w/ hand surgeon located at [address], [place], [place] [phone]. He verbalized understanding.

## 2015-03-28 NOTE — Telephone Encounter (Deleted)
Went and saw Copy. Last week had appt. Legrand Como.

## 2015-03-29 DIAGNOSIS — N186 End stage renal disease: Secondary | ICD-10-CM | POA: Diagnosis not present

## 2015-03-29 DIAGNOSIS — N2581 Secondary hyperparathyroidism of renal origin: Secondary | ICD-10-CM | POA: Diagnosis not present

## 2015-03-29 DIAGNOSIS — Z992 Dependence on renal dialysis: Secondary | ICD-10-CM | POA: Diagnosis not present

## 2015-03-29 NOTE — Telephone Encounter (Signed)
Spoke to pt and advised him that the referral to Dr. Noemi Chapel, the hand surgeon, was sent internally. This means that his office can see the NCV/EMG results on EPIC. If they cannot see them, they can contact our office. Their office will be contacting the pt for an appt. The referral was sent to them on 11/22. Pt verbalized understanding.

## 2015-03-29 NOTE — Telephone Encounter (Signed)
Patient called back inquiring as to who will send results from EMG/NCS to hand surgeon

## 2015-03-31 DIAGNOSIS — Z992 Dependence on renal dialysis: Secondary | ICD-10-CM | POA: Diagnosis not present

## 2015-03-31 DIAGNOSIS — N2581 Secondary hyperparathyroidism of renal origin: Secondary | ICD-10-CM | POA: Diagnosis not present

## 2015-03-31 DIAGNOSIS — N186 End stage renal disease: Secondary | ICD-10-CM | POA: Diagnosis not present

## 2015-03-31 NOTE — Telephone Encounter (Signed)
Called back and spoke to [place]. Expressed that we have spoken to pt several times about results of EMG/NCS and that Dr Jaynee Eagles referred him to Dr Vanita Ingles surgeon and explained he should get a call to schedule appt. Also expressed I gave the pt the phone number. She is going to let Ramiro Harvest know about situation.

## 2015-03-31 NOTE — Telephone Encounter (Signed)
Phone call from Ramiro Harvest, [place] / Dr. Lowanda Foster [phone], patient expressed concern to PA that he does not have a follow up appointment re: NCS/EMG with Dr. Jaynee Eagles. I advised Dr. Jaynee Eagles will be back in the office on Monday and someone will call back then, states this is fine, can also speak with Heather at above number when you call back.

## 2015-04-03 ENCOUNTER — Telehealth: Payer: Self-pay | Admitting: *Deleted

## 2015-04-03 DIAGNOSIS — N186 End stage renal disease: Secondary | ICD-10-CM | POA: Diagnosis not present

## 2015-04-03 DIAGNOSIS — Z992 Dependence on renal dialysis: Secondary | ICD-10-CM | POA: Diagnosis not present

## 2015-04-03 DIAGNOSIS — N2581 Secondary hyperparathyroidism of renal origin: Secondary | ICD-10-CM | POA: Diagnosis not present

## 2015-04-03 NOTE — Telephone Encounter (Signed)
Ramiro Harvest, PA from  Dr. Lowanda Foster office called and wanted to know about pt and EMG/NCS study results. Advised I spoke to [place] on Friday. Relayed results and explained we sent referral to Dr Noemi Chapel and gave address and phone number to him to give to pt again. Explained I spoke to pt a few times about this. He is going to give pt information again. Pt in his office now. Told him to call back if he needed anything further.

## 2015-04-04 DIAGNOSIS — G5601 Carpal tunnel syndrome, right upper limb: Secondary | ICD-10-CM | POA: Diagnosis not present

## 2015-04-04 DIAGNOSIS — G5602 Carpal tunnel syndrome, left upper limb: Secondary | ICD-10-CM | POA: Diagnosis not present

## 2015-04-05 DIAGNOSIS — N2581 Secondary hyperparathyroidism of renal origin: Secondary | ICD-10-CM | POA: Diagnosis not present

## 2015-04-05 DIAGNOSIS — Z992 Dependence on renal dialysis: Secondary | ICD-10-CM | POA: Diagnosis not present

## 2015-04-05 DIAGNOSIS — N186 End stage renal disease: Secondary | ICD-10-CM | POA: Diagnosis not present

## 2015-04-07 DIAGNOSIS — N186 End stage renal disease: Secondary | ICD-10-CM | POA: Diagnosis not present

## 2015-04-07 DIAGNOSIS — N2581 Secondary hyperparathyroidism of renal origin: Secondary | ICD-10-CM | POA: Diagnosis not present

## 2015-04-07 DIAGNOSIS — Z992 Dependence on renal dialysis: Secondary | ICD-10-CM | POA: Diagnosis not present

## 2015-04-10 DIAGNOSIS — N186 End stage renal disease: Secondary | ICD-10-CM | POA: Diagnosis not present

## 2015-04-10 DIAGNOSIS — N2581 Secondary hyperparathyroidism of renal origin: Secondary | ICD-10-CM | POA: Diagnosis not present

## 2015-04-10 DIAGNOSIS — Z992 Dependence on renal dialysis: Secondary | ICD-10-CM | POA: Diagnosis not present

## 2015-04-11 DIAGNOSIS — G5601 Carpal tunnel syndrome, right upper limb: Secondary | ICD-10-CM | POA: Diagnosis not present

## 2015-04-11 DIAGNOSIS — G5602 Carpal tunnel syndrome, left upper limb: Secondary | ICD-10-CM | POA: Diagnosis not present

## 2015-04-12 DIAGNOSIS — N2581 Secondary hyperparathyroidism of renal origin: Secondary | ICD-10-CM | POA: Diagnosis not present

## 2015-04-12 DIAGNOSIS — N186 End stage renal disease: Secondary | ICD-10-CM | POA: Diagnosis not present

## 2015-04-12 DIAGNOSIS — Z992 Dependence on renal dialysis: Secondary | ICD-10-CM | POA: Diagnosis not present

## 2015-04-14 DIAGNOSIS — N2581 Secondary hyperparathyroidism of renal origin: Secondary | ICD-10-CM | POA: Diagnosis not present

## 2015-04-14 DIAGNOSIS — Z992 Dependence on renal dialysis: Secondary | ICD-10-CM | POA: Diagnosis not present

## 2015-04-14 DIAGNOSIS — N186 End stage renal disease: Secondary | ICD-10-CM | POA: Diagnosis not present

## 2015-04-17 DIAGNOSIS — N2581 Secondary hyperparathyroidism of renal origin: Secondary | ICD-10-CM | POA: Diagnosis not present

## 2015-04-17 DIAGNOSIS — N186 End stage renal disease: Secondary | ICD-10-CM | POA: Diagnosis not present

## 2015-04-17 DIAGNOSIS — Z992 Dependence on renal dialysis: Secondary | ICD-10-CM | POA: Diagnosis not present

## 2015-04-19 DIAGNOSIS — Z992 Dependence on renal dialysis: Secondary | ICD-10-CM | POA: Diagnosis not present

## 2015-04-19 DIAGNOSIS — N186 End stage renal disease: Secondary | ICD-10-CM | POA: Diagnosis not present

## 2015-04-19 DIAGNOSIS — N2581 Secondary hyperparathyroidism of renal origin: Secondary | ICD-10-CM | POA: Diagnosis not present

## 2015-04-20 DIAGNOSIS — G5602 Carpal tunnel syndrome, left upper limb: Secondary | ICD-10-CM | POA: Diagnosis not present

## 2015-04-20 HISTORY — PX: OTHER SURGICAL HISTORY: SHX169

## 2015-04-21 DIAGNOSIS — N186 End stage renal disease: Secondary | ICD-10-CM | POA: Diagnosis not present

## 2015-04-21 DIAGNOSIS — Z992 Dependence on renal dialysis: Secondary | ICD-10-CM | POA: Diagnosis not present

## 2015-04-21 DIAGNOSIS — N2581 Secondary hyperparathyroidism of renal origin: Secondary | ICD-10-CM | POA: Diagnosis not present

## 2015-04-24 DIAGNOSIS — N2581 Secondary hyperparathyroidism of renal origin: Secondary | ICD-10-CM | POA: Diagnosis not present

## 2015-04-24 DIAGNOSIS — N186 End stage renal disease: Secondary | ICD-10-CM | POA: Diagnosis not present

## 2015-04-24 DIAGNOSIS — Z992 Dependence on renal dialysis: Secondary | ICD-10-CM | POA: Diagnosis not present

## 2015-04-25 DIAGNOSIS — L97521 Non-pressure chronic ulcer of other part of left foot limited to breakdown of skin: Secondary | ICD-10-CM | POA: Diagnosis not present

## 2015-04-25 DIAGNOSIS — L97511 Non-pressure chronic ulcer of other part of right foot limited to breakdown of skin: Secondary | ICD-10-CM | POA: Diagnosis not present

## 2015-04-25 DIAGNOSIS — E1342 Other specified diabetes mellitus with diabetic polyneuropathy: Secondary | ICD-10-CM | POA: Diagnosis not present

## 2015-04-26 DIAGNOSIS — N186 End stage renal disease: Secondary | ICD-10-CM | POA: Diagnosis not present

## 2015-04-26 DIAGNOSIS — Z992 Dependence on renal dialysis: Secondary | ICD-10-CM | POA: Diagnosis not present

## 2015-04-26 DIAGNOSIS — N2581 Secondary hyperparathyroidism of renal origin: Secondary | ICD-10-CM | POA: Diagnosis not present

## 2015-04-28 DIAGNOSIS — N186 End stage renal disease: Secondary | ICD-10-CM | POA: Diagnosis not present

## 2015-04-28 DIAGNOSIS — N2581 Secondary hyperparathyroidism of renal origin: Secondary | ICD-10-CM | POA: Diagnosis not present

## 2015-04-28 DIAGNOSIS — Z992 Dependence on renal dialysis: Secondary | ICD-10-CM | POA: Diagnosis not present

## 2015-04-29 DIAGNOSIS — Z992 Dependence on renal dialysis: Secondary | ICD-10-CM | POA: Diagnosis not present

## 2015-04-29 DIAGNOSIS — N186 End stage renal disease: Secondary | ICD-10-CM | POA: Diagnosis not present

## 2015-05-01 DIAGNOSIS — D509 Iron deficiency anemia, unspecified: Secondary | ICD-10-CM | POA: Diagnosis not present

## 2015-05-01 DIAGNOSIS — N2581 Secondary hyperparathyroidism of renal origin: Secondary | ICD-10-CM | POA: Diagnosis not present

## 2015-05-01 DIAGNOSIS — N186 End stage renal disease: Secondary | ICD-10-CM | POA: Diagnosis not present

## 2015-05-01 DIAGNOSIS — Z992 Dependence on renal dialysis: Secondary | ICD-10-CM | POA: Diagnosis not present

## 2015-05-03 DIAGNOSIS — D509 Iron deficiency anemia, unspecified: Secondary | ICD-10-CM | POA: Diagnosis not present

## 2015-05-03 DIAGNOSIS — N186 End stage renal disease: Secondary | ICD-10-CM | POA: Diagnosis not present

## 2015-05-03 DIAGNOSIS — N2581 Secondary hyperparathyroidism of renal origin: Secondary | ICD-10-CM | POA: Diagnosis not present

## 2015-05-03 DIAGNOSIS — Z992 Dependence on renal dialysis: Secondary | ICD-10-CM | POA: Diagnosis not present

## 2015-05-05 DIAGNOSIS — N2581 Secondary hyperparathyroidism of renal origin: Secondary | ICD-10-CM | POA: Diagnosis not present

## 2015-05-05 DIAGNOSIS — D509 Iron deficiency anemia, unspecified: Secondary | ICD-10-CM | POA: Diagnosis not present

## 2015-05-05 DIAGNOSIS — N186 End stage renal disease: Secondary | ICD-10-CM | POA: Diagnosis not present

## 2015-05-05 DIAGNOSIS — Z992 Dependence on renal dialysis: Secondary | ICD-10-CM | POA: Diagnosis not present

## 2015-05-09 ENCOUNTER — Emergency Department (HOSPITAL_COMMUNITY)
Admission: EM | Admit: 2015-05-09 | Discharge: 2015-05-10 | Disposition: A | Discharge status: Home / Self Care | Payer: Medicare Other | Source: Home / Self Care | Attending: Emergency Medicine | Admitting: Emergency Medicine

## 2015-05-09 ENCOUNTER — Emergency Department (HOSPITAL_COMMUNITY)
Admission: EM | Admit: 2015-05-09 | Discharge: 2015-05-09 | Disposition: A | Discharge status: Home / Self Care | Payer: Medicare Other | Attending: Emergency Medicine | Admitting: Emergency Medicine

## 2015-05-09 ENCOUNTER — Encounter (HOSPITAL_COMMUNITY): Payer: Self-pay | Admitting: *Deleted

## 2015-05-09 ENCOUNTER — Encounter (HOSPITAL_COMMUNITY): Payer: Self-pay | Admitting: Emergency Medicine

## 2015-05-09 DIAGNOSIS — Y9289 Other specified places as the place of occurrence of the external cause: Secondary | ICD-10-CM | POA: Diagnosis not present

## 2015-05-09 DIAGNOSIS — X58XXXA Exposure to other specified factors, initial encounter: Secondary | ICD-10-CM | POA: Diagnosis not present

## 2015-05-09 DIAGNOSIS — Z992 Dependence on renal dialysis: Secondary | ICD-10-CM | POA: Insufficient documentation

## 2015-05-09 DIAGNOSIS — Z7902 Long term (current) use of antithrombotics/antiplatelets: Secondary | ICD-10-CM | POA: Insufficient documentation

## 2015-05-09 DIAGNOSIS — Z8673 Personal history of transient ischemic attack (TIA), and cerebral infarction without residual deficits: Secondary | ICD-10-CM | POA: Insufficient documentation

## 2015-05-09 DIAGNOSIS — X58XXXD Exposure to other specified factors, subsequent encounter: Secondary | ICD-10-CM

## 2015-05-09 DIAGNOSIS — N186 End stage renal disease: Secondary | ICD-10-CM

## 2015-05-09 DIAGNOSIS — T8249XD Other complication of vascular dialysis catheter, subsequent encounter: Secondary | ICD-10-CM

## 2015-05-09 DIAGNOSIS — Y658 Other specified misadventures during surgical and medical care: Secondary | ICD-10-CM | POA: Insufficient documentation

## 2015-05-09 DIAGNOSIS — Z87891 Personal history of nicotine dependence: Secondary | ICD-10-CM | POA: Insufficient documentation

## 2015-05-09 DIAGNOSIS — Y998 Other external cause status: Secondary | ICD-10-CM | POA: Diagnosis not present

## 2015-05-09 DIAGNOSIS — Z8719 Personal history of other diseases of the digestive system: Secondary | ICD-10-CM | POA: Insufficient documentation

## 2015-05-09 DIAGNOSIS — Z452 Encounter for adjustment and management of vascular access device: Secondary | ICD-10-CM | POA: Diagnosis present

## 2015-05-09 DIAGNOSIS — Z79899 Other long term (current) drug therapy: Secondary | ICD-10-CM | POA: Insufficient documentation

## 2015-05-09 DIAGNOSIS — Y9389 Activity, other specified: Secondary | ICD-10-CM | POA: Diagnosis not present

## 2015-05-09 DIAGNOSIS — T829XXA Unspecified complication of cardiac and vascular prosthetic device, implant and graft, initial encounter: Secondary | ICD-10-CM

## 2015-05-09 DIAGNOSIS — T8249XA Other complication of vascular dialysis catheter, initial encounter: Secondary | ICD-10-CM | POA: Insufficient documentation

## 2015-05-09 DIAGNOSIS — T82898A Other specified complication of vascular prosthetic devices, implants and grafts, initial encounter: Secondary | ICD-10-CM | POA: Diagnosis not present

## 2015-05-09 DIAGNOSIS — I120 Hypertensive chronic kidney disease with stage 5 chronic kidney disease or end stage renal disease: Secondary | ICD-10-CM | POA: Insufficient documentation

## 2015-05-09 DIAGNOSIS — I871 Compression of vein: Secondary | ICD-10-CM | POA: Diagnosis not present

## 2015-05-09 DIAGNOSIS — T82838D Hemorrhage of vascular prosthetic devices, implants and grafts, subsequent encounter: Secondary | ICD-10-CM

## 2015-05-09 DIAGNOSIS — T82858D Stenosis of vascular prosthetic devices, implants and grafts, subsequent encounter: Secondary | ICD-10-CM | POA: Diagnosis not present

## 2015-05-09 NOTE — ED Notes (Signed)
Patient states he had dialysis access put into his left chest today and when he got home this evening it started bleeding. States "it got my whole pillow wet."

## 2015-05-09 NOTE — ED Provider Notes (Signed)
CSN: [id number]     Arrival date & time 05/09/15  1950 History   First MD Initiated Contact with Patient 05/09/15 2122     Chief Complaint  Patient presents with  . Vascular Access Problem     (Consider location/radiation/quality/duration/timing/severity/associated sxs/prior Treatment) HPI Comments: Dialysis cath placed in L chest today 1.5 hours ago started bleeding No associated sx Sx are mild, constant, no sob,, no anticoag other than plavix.  The history is provided by the patient.    Past Medical History  Diagnosis Date  . Hypertension   . CVA (cerebral infarction)   . Gastric ulcer 2004    treated for h.pylori see PSH  . LVH (left ventricular hypertrophy)   . Stroke ([place])   . ESRD (end stage renal disease) on dialysis ([place])   . Dialysis patient [facility])    Past Surgical History  Procedure Laterality Date  . Cholecystectomy    . Shoulder surgery    . Colonoscopy  2004    Dr. Irving Shows, left sided diverticula and cecal polyp, path unknown  . Esophagogastroduodenoscopy  11/2002    Dr. Gala Romney, erosive reflux esophagitis, multiple gastric ulcer and antral/bulbar erosions. Serologies positive for H.Pylori and was treated  . Esophagogastroduodenoscopy  11/20014    Dr. Gala Romney, small hh only, ulcers healed  . Esophagogastroduodenoscopy  09/21/2011    Dr Trevor Iha [facility], antral erosions, ?early GAVE  . Colonoscopy  10/29/2011    Procedure: COLONOSCOPY;  Surgeon: Daneil Dolin, MD;  [facility];  Service: Endoscopy;  Laterality: N/A;  10:15  . Arteriovenous graft placement Right right arm  . Abdominal aortagram N/A 01/24/2012    Procedure: ABDOMINAL Maxcine Ham;  Surgeon: Elam Dutch, MD;  Location: [facility] CATH LAB;  Service: Cardiovascular;  Laterality: N/A;   Family History  Problem Relation Age of Onset  . Colon cancer Neg Hx   . Liver disease Neg Hx    Social History  Substance Use Topics  . Smoking status: Former Smoker    Quit date: 04/29/2004  . Smokeless  tobacco: Former Systems developer    Types: [place] date: 01/16/1987     Comment: quit 2006  . Alcohol Use: No    Review of Systems  All other systems reviewed and are negative.     Allergies  Aspirin  Home Medications   Prior to Admission medications   Medication Sig Start Date End Date Taking? Authorizing Provider  ALPRAZolam Duanne Moron) 0.5 MG tablet TAKE ONE TABLET BY MOUTH ONCE DAILY AT BEDTIME 04/14/15  Yes Historical Provider, MD  amLODipine (NORVASC) 10 MG tablet Take 10 mg by mouth daily.   Yes Historical Provider, MD  b complex-vitamin c-folic acid (NEPHRO-VITE) 0.8 MG TABS Take 0.8 mg by mouth as directed. Takes on Tuesdays, Thursdays, Saturdays, and Sundays. Does not take on Mondays, Wednesdays, and Fridays due to Dialysis treatments.   Yes Historical Provider, MD  cinacalcet (SENSIPAR) 60 MG tablet Take 60 mg by mouth every evening. With evening meal   Yes Historical Provider, MD  clopidogrel (PLAVIX) 75 MG tablet Take 75 mg by mouth daily. 07/26/10  Yes Historical Provider, MD  meclizine (ANTIVERT) 25 MG tablet Take 1 tablet (25 mg total) by mouth 3 (three) times daily as needed for dizziness. Patient taking differently: Take 25 mg by mouth daily.  03/05/15  Yes [name], MD  metoprolol succinate (TOPROL-XL) 50 MG 24 hr tablet Take 50 mg by mouth daily.    Yes Historical Provider, MD  omeprazole (PRILOSEC) 20 MG capsule Take 20 mg by mouth daily.  07/30/10  Yes Historical Provider, MD  promethazine (PHENERGAN) 25 MG tablet Take 25 mg by mouth daily.  07/22/11  Yes Historical Provider, MD  sevelamer (RENVELA) 800 MG tablet Take 2,400-4,000 mg by mouth 3 (three) times daily with meals. Patient takes 5 tablets with meals and 3 tablets with snacks   Yes Historical Provider, MD  HYDROcodone-acetaminophen (NORCO/VICODIN) 5-325 MG per tablet Take 1 tablet by mouth every 4 (four) hours as needed for pain. Patient not taking: Reported on 02/28/2015 07/12/12   Prentiss Bells, MD   BP 134/76 mmHg   Pulse 84  Temp(Src) 98 F (36.7 C) (Oral)  Resp 17  Ht 5\' 5"  (1.651 m)  Wt 184 lb (83.462 kg)  BMI 30.62 kg/m2  SpO2 99% Physical Exam  Constitutional: He appears well-developed and well-nourished. No distress.  HENT:  Head: Normocephalic and atraumatic.  Eyes: Conjunctivae are normal. Right eye exhibits no discharge. Left eye exhibits no discharge.  Pulmonary/Chest: Effort normal. No respiratory distress.  Neurological: He is alert. Coordination normal.  Skin: Skin is warm and dry. No rash noted. He is not diaphoretic. No erythema.  Mild bleeding ongoing - stopped with quick clot  Psychiatric: He has a normal mood and affect.  Nursing note and vitals reviewed.   ED Course  Procedures (including critical care time) Labs Review Labs Reviewed - No data to display  Imaging Review No results found. I have personally reviewed and evaluated these images and lab results as part of my medical decision-making.    MDM   Final diagnoses:  Complication of vascular access for dialysis, initial encounter ([place])    Well appaering, VS stable and noraml Blood stopped with quick clot Home wit dialysis in AM for recheck of site.    Noemi Chapel, MD 05/09/15 [phone]

## 2015-05-09 NOTE — ED Notes (Signed)
Pt reports getting home and port area began bleeding.  Pt seen and treated for same earlier tonight.

## 2015-05-10 DIAGNOSIS — N186 End stage renal disease: Secondary | ICD-10-CM | POA: Diagnosis not present

## 2015-05-10 DIAGNOSIS — Z992 Dependence on renal dialysis: Secondary | ICD-10-CM | POA: Diagnosis not present

## 2015-05-10 DIAGNOSIS — D509 Iron deficiency anemia, unspecified: Secondary | ICD-10-CM | POA: Diagnosis not present

## 2015-05-10 DIAGNOSIS — N2581 Secondary hyperparathyroidism of renal origin: Secondary | ICD-10-CM | POA: Diagnosis not present

## 2015-05-10 NOTE — ED Provider Notes (Signed)
TIME SEEN: 2:30 AM    CHIEF COMPLAINT: bleeding from left dialysis catheter  HPI: Patient is a 75 y.o. M with history of hypertension, CVA, end-stage renal disease who is now on hemodialysis who had a left chest dialysis catheter placed yesterday. He was seen yesterday evening for bleeding. Chronic clot and a pressure bandage was applied to this area. He states when he got home he noticed that the bandage appeared to be soaked in blood and he came back to the hospital. Currently there is no active bleeding but only clot. He is not on any anticoagulation or antiplatelet agent per his report. Denies any pain. Is scheduled for dialysis in the morning.  ROS: See HPI Constitutional: no fever  Eyes: no drainage  ENT: no runny nose   Cardiovascular:  no chest pain  Resp: no SOB  GI: no vomiting GU: no dysuria Integumentary: no rash  Allergy: no hives  Musculoskeletal: no leg swelling  Neurological: no slurred speech ROS otherwise negative  PAST MEDICAL HISTORY/PAST SURGICAL HISTORY:  Past Medical History  Diagnosis Date  . Hypertension   . CVA (cerebral infarction)   . Gastric ulcer 2004    treated for h.pylori see PSH  . LVH (left ventricular hypertrophy)   . Stroke ([place])   . ESRD (end stage renal disease) on dialysis ([place])   . Dialysis patient [facility])     MEDICATIONS:  Prior to Admission medications   Medication Sig Start Date End Date Taking? Authorizing Provider  ALPRAZolam Duanne Moron) 0.5 MG tablet TAKE ONE TABLET BY MOUTH ONCE DAILY AT BEDTIME 04/14/15   Historical Provider, MD  amLODipine (NORVASC) 10 MG tablet Take 10 mg by mouth daily.    Historical Provider, MD  b complex-vitamin c-folic acid (NEPHRO-VITE) 0.8 MG TABS Take 0.8 mg by mouth as directed. Takes on Tuesdays, Thursdays, Saturdays, and Sundays. Does not take on Mondays, Wednesdays, and Fridays due to Dialysis treatments.    Historical Provider, MD  cinacalcet (SENSIPAR) 60 MG tablet Take 60 mg by mouth every evening.  With evening meal    Historical Provider, MD  clopidogrel (PLAVIX) 75 MG tablet Take 75 mg by mouth daily. 07/26/10   Historical Provider, MD  HYDROcodone-acetaminophen (NORCO/VICODIN) 5-325 MG per tablet Take 1 tablet by mouth every 4 (four) hours as needed for pain. Patient not taking: Reported on 02/28/2015 07/12/12   Prentiss Bells, MD  meclizine (ANTIVERT) 25 MG tablet Take 1 tablet (25 mg total) by mouth 3 (three) times daily as needed for dizziness. Patient taking differently: Take 25 mg by mouth daily.  03/05/15   [name], MD  metoprolol succinate (TOPROL-XL) 50 MG 24 hr tablet Take 50 mg by mouth daily.     Historical Provider, MD  omeprazole (PRILOSEC) 20 MG capsule Take 20 mg by mouth daily.  07/30/10   Historical Provider, MD  promethazine (PHENERGAN) 25 MG tablet Take 25 mg by mouth daily.  07/22/11   Historical Provider, MD  sevelamer (RENVELA) 800 MG tablet Take 2,400-4,000 mg by mouth 3 (three) times daily with meals. Patient takes 5 tablets with meals and 3 tablets with snacks    Historical Provider, MD    ALLERGIES:  Allergies  Allergen Reactions  . Aspirin Other (See Comments)    Causes internal bleeding     SOCIAL HISTORY:  Social History  Substance Use Topics  . Smoking status: Former Smoker    Quit date: 04/29/2004  . Smokeless tobacco: Former Systems developer    Types: Loss adjuster, chartered  Quit date: 01/16/1987     Comment: quit 2006  . Alcohol Use: No    FAMILY HISTORY: Family History  Problem Relation Age of Onset  . Colon cancer Neg Hx   . Liver disease Neg Hx     EXAM: BP 142/83 mmHg  Pulse 87  Temp(Src) 98.2 F (36.8 C) (Oral)  Resp 16  Wt 184 lb (83.462 kg)  SpO2 100% CONSTITUTIONAL: Alert and oriented and responds appropriately to questions. Well-appearing; well-nourished HEAD: Normocephalic EYES: Conjunctivae clear, PERRL ENT: normal nose; no rhinorrhea; moist mucous membranes; pharynx without lesions noted NECK: Supple, no meningismus, no LAD  CARD: RRR; S1 and S2  appreciated; no murmurs, no clicks, no rubs, no gallops CHEST:  Dialysis catheter in the left chest wall without erythema, warmth, fluctuance or purulent drainage. He has mild tender around his dialysis catheter. There is clot associated around the entrance of the dialysis catheter as well as the area where the catheter has been sutured in place but no active bleeding. RESP: Normal chest excursion without splinting or tachypnea; breath sounds clear and equal bilaterally; no wheezes, no rhonchi, no rales, no hypoxia or respiratory distress, speaking full sentences ABD/GI: Normal bowel sounds; non-distended; soft, non-tender, no rebound, no guarding, no peritoneal signs BACK:  The back appears normal and is non-tender to palpation, there is no CVA tenderness EXT: Normal ROM in all joints; non-tender to palpation; no edema; normal capillary refill; no cyanosis, no calf tenderness or swelling    SKIN: Normal color for age and race; warm NEURO: Moves all extremities equally, sensation to light touch intact diffusely, cranial nerves II through XII intact PSYCH: The patient's mood and manner are appropriate. Grooming and personal hygiene are appropriate.  MEDICAL DECISION MAKING: Patient here with bleeding from his dialysis catheter site that was placed yesterday. Patient has been monitored for several hours without any active bleeding. We have replaced his quick clot and a new pressure bandage. Discussed with patient I do not recommend removing the clots around the sutures or dialysis catheter as it may begin to rebleed. Discussed return precautions. I feel he is safe to go to dialysis in the morning and have his catheter use. He verbalizes understanding and is comfortable with this plan.  Patient is hemodynamically stable.      [place], DO 05/10/15 [phone]

## 2015-05-10 NOTE — Discharge Instructions (Signed)
You were seen in the emergency department from bleeding from your dialysis catheter. It is not bleeding currently. If you notice that there is heavy bleeding around the catheter recommend you hold direct pressure for 20 minutes. If this does not stop the bleeding please return to the emergency department. You may have a small amount of blood around this area which is normal. You also have clot around this area which we recommend you do not remove as this may make bleeding return. I feel you're safe to go for dialysis in the morning.   Central Line Dialysis Access Placement, Care After Refer to this sheet after your procedure. These instructions provide you with information on caring for yourself after your procedure. Your health care provider may also give you more specific instructions. Your treatment has been planned according to current medical practices, but problems sometimes occur. Call your health care provider if you have any problems or questions after your procedure.  WHAT TO EXPECT AFTER THE PROCEDURE  You may feel some discomfort after the local anesthetic wears off. Your discomfort should gradually improve over the next several days. Ask your health care provider if you can take pain medicines.  You may notice some redness, mild pain, swelling, bruising, or light bleeding at the site where the thin, flexible tube (catheter) was placed. These should improve over the next several days. HOME CARE INSTRUCTIONS   Rest for the remainder of the day.  Avoid any heavy lifting (more than 10 lb [4.5 kg]) for at least 3 days.   If your catheter bandage (dressing) becomes soaked with blood, apply firm and direct pressure on the insertion site and sit up for 20 minutes. Your dialysis nurses will change your dressing during your next visit or at your next dialysis treatment.  Keep the dressing around the insertion site dry. Avoid using the shower. You may take a bath after 24 hours. Bathe in a tub  and keep the dressing and catheter above the level of the water. Do not submerge catheter underwater, such as by swimming.  You may resume your usual diet.  Do not operate heavy machinery, drive, or make legal decisions for the first 24 hours after the procedure if you were given sedatives or other medicines to help you relax.  SEEK MEDICAL CARE IF:  The catheter gets pulled or comes out.  You develop any signs of infection around the insertion site such as:   Bleeding that does not stop even after applying pressure as instructed.  Increased pain.  Unusual drainage such as pus.   You develop a fever or chills.   You have any other questions or concerns related to your procedure or the care of your central line.  SEEK IMMEDIATE MEDICAL CARE IF:  You develop lightheadedness or dizziness.   You faint.   You develop shortness of breath or difficulty breathing.   You have any symptoms of an allergic reaction, such as:  Itching.   Rash at the site of insertion.   This information is not intended to replace advice given to you by your health care provider. Make sure you discuss any questions you have with your health care provider.   Document Released: 11/28/2003 Document Revised: 04/20/2013 Document Reviewed: 01/23/2012 Elsevier Interactive Patient Education [employer].

## 2015-05-10 NOTE — ED Notes (Signed)
quikclot dressing applied with 4x4's and tegaderms; pt tolerated well

## 2015-05-11 DIAGNOSIS — N2581 Secondary hyperparathyroidism of renal origin: Secondary | ICD-10-CM | POA: Diagnosis not present

## 2015-05-11 DIAGNOSIS — Z992 Dependence on renal dialysis: Secondary | ICD-10-CM | POA: Diagnosis not present

## 2015-05-11 DIAGNOSIS — N186 End stage renal disease: Secondary | ICD-10-CM | POA: Diagnosis not present

## 2015-05-11 DIAGNOSIS — D509 Iron deficiency anemia, unspecified: Secondary | ICD-10-CM | POA: Diagnosis not present

## 2015-05-12 DIAGNOSIS — D509 Iron deficiency anemia, unspecified: Secondary | ICD-10-CM | POA: Diagnosis not present

## 2015-05-12 DIAGNOSIS — N2581 Secondary hyperparathyroidism of renal origin: Secondary | ICD-10-CM | POA: Diagnosis not present

## 2015-05-12 DIAGNOSIS — Z992 Dependence on renal dialysis: Secondary | ICD-10-CM | POA: Diagnosis not present

## 2015-05-12 DIAGNOSIS — N186 End stage renal disease: Secondary | ICD-10-CM | POA: Diagnosis not present

## 2015-05-15 ENCOUNTER — Other Ambulatory Visit: Payer: Self-pay

## 2015-05-15 DIAGNOSIS — Z992 Dependence on renal dialysis: Secondary | ICD-10-CM | POA: Diagnosis not present

## 2015-05-15 DIAGNOSIS — Z01810 Encounter for preprocedural cardiovascular examination: Secondary | ICD-10-CM

## 2015-05-15 DIAGNOSIS — D509 Iron deficiency anemia, unspecified: Secondary | ICD-10-CM | POA: Diagnosis not present

## 2015-05-15 DIAGNOSIS — N186 End stage renal disease: Secondary | ICD-10-CM | POA: Diagnosis not present

## 2015-05-15 DIAGNOSIS — N2581 Secondary hyperparathyroidism of renal origin: Secondary | ICD-10-CM | POA: Diagnosis not present

## 2015-05-17 DIAGNOSIS — N186 End stage renal disease: Secondary | ICD-10-CM | POA: Diagnosis not present

## 2015-05-17 DIAGNOSIS — D509 Iron deficiency anemia, unspecified: Secondary | ICD-10-CM | POA: Diagnosis not present

## 2015-05-17 DIAGNOSIS — N2581 Secondary hyperparathyroidism of renal origin: Secondary | ICD-10-CM | POA: Diagnosis not present

## 2015-05-17 DIAGNOSIS — Z992 Dependence on renal dialysis: Secondary | ICD-10-CM | POA: Diagnosis not present

## 2015-05-18 DIAGNOSIS — M1A00X0 Idiopathic chronic gout, unspecified site, without tophus (tophi): Secondary | ICD-10-CM | POA: Diagnosis not present

## 2015-05-18 DIAGNOSIS — K219 Gastro-esophageal reflux disease without esophagitis: Secondary | ICD-10-CM | POA: Diagnosis not present

## 2015-05-18 DIAGNOSIS — N186 End stage renal disease: Secondary | ICD-10-CM | POA: Diagnosis not present

## 2015-05-18 DIAGNOSIS — I10 Essential (primary) hypertension: Secondary | ICD-10-CM | POA: Diagnosis not present

## 2015-05-19 ENCOUNTER — Encounter: Payer: Self-pay | Admitting: Vascular Surgery

## 2015-05-19 DIAGNOSIS — N2581 Secondary hyperparathyroidism of renal origin: Secondary | ICD-10-CM | POA: Diagnosis not present

## 2015-05-19 DIAGNOSIS — D509 Iron deficiency anemia, unspecified: Secondary | ICD-10-CM | POA: Diagnosis not present

## 2015-05-19 DIAGNOSIS — Z992 Dependence on renal dialysis: Secondary | ICD-10-CM | POA: Diagnosis not present

## 2015-05-19 DIAGNOSIS — N186 End stage renal disease: Secondary | ICD-10-CM | POA: Diagnosis not present

## 2015-05-22 ENCOUNTER — Encounter: Payer: Self-pay | Admitting: Vascular Surgery

## 2015-05-22 DIAGNOSIS — N186 End stage renal disease: Secondary | ICD-10-CM | POA: Diagnosis not present

## 2015-05-22 DIAGNOSIS — N2581 Secondary hyperparathyroidism of renal origin: Secondary | ICD-10-CM | POA: Diagnosis not present

## 2015-05-22 DIAGNOSIS — Z992 Dependence on renal dialysis: Secondary | ICD-10-CM | POA: Diagnosis not present

## 2015-05-22 DIAGNOSIS — D509 Iron deficiency anemia, unspecified: Secondary | ICD-10-CM | POA: Diagnosis not present

## 2015-05-23 DIAGNOSIS — L97511 Non-pressure chronic ulcer of other part of right foot limited to breakdown of skin: Secondary | ICD-10-CM | POA: Diagnosis not present

## 2015-05-23 DIAGNOSIS — L97521 Non-pressure chronic ulcer of other part of left foot limited to breakdown of skin: Secondary | ICD-10-CM | POA: Diagnosis not present

## 2015-05-23 DIAGNOSIS — B351 Tinea unguium: Secondary | ICD-10-CM | POA: Diagnosis not present

## 2015-05-23 DIAGNOSIS — E1142 Type 2 diabetes mellitus with diabetic polyneuropathy: Secondary | ICD-10-CM | POA: Diagnosis not present

## 2015-05-24 DIAGNOSIS — N2581 Secondary hyperparathyroidism of renal origin: Secondary | ICD-10-CM | POA: Diagnosis not present

## 2015-05-24 DIAGNOSIS — Z992 Dependence on renal dialysis: Secondary | ICD-10-CM | POA: Diagnosis not present

## 2015-05-24 DIAGNOSIS — D509 Iron deficiency anemia, unspecified: Secondary | ICD-10-CM | POA: Diagnosis not present

## 2015-05-24 DIAGNOSIS — N186 End stage renal disease: Secondary | ICD-10-CM | POA: Diagnosis not present

## 2015-05-25 ENCOUNTER — Ambulatory Visit (HOSPITAL_COMMUNITY)
Admission: RE | Admit: 2015-05-25 | Discharge: 2015-05-25 | Disposition: A | Discharge status: Home / Self Care | Payer: Medicare Other | Source: Ambulatory Visit | Attending: Vascular Surgery | Admitting: Vascular Surgery

## 2015-05-25 ENCOUNTER — Ambulatory Visit (INDEPENDENT_AMBULATORY_CARE_PROVIDER_SITE_OTHER)
Admission: RE | Admit: 2015-05-25 | Discharge: 2015-05-25 | Disposition: A | Discharge status: Home / Self Care | Payer: Medicare Other | Source: Ambulatory Visit | Attending: Vascular Surgery | Admitting: Vascular Surgery

## 2015-05-25 DIAGNOSIS — N186 End stage renal disease: Secondary | ICD-10-CM

## 2015-05-25 DIAGNOSIS — Z01810 Encounter for preprocedural cardiovascular examination: Secondary | ICD-10-CM

## 2015-05-25 DIAGNOSIS — I120 Hypertensive chronic kidney disease with stage 5 chronic kidney disease or end stage renal disease: Secondary | ICD-10-CM | POA: Diagnosis not present

## 2015-05-25 DIAGNOSIS — Z992 Dependence on renal dialysis: Secondary | ICD-10-CM | POA: Insufficient documentation

## 2015-05-26 DIAGNOSIS — N186 End stage renal disease: Secondary | ICD-10-CM | POA: Diagnosis not present

## 2015-05-26 DIAGNOSIS — Z992 Dependence on renal dialysis: Secondary | ICD-10-CM | POA: Diagnosis not present

## 2015-05-26 DIAGNOSIS — D509 Iron deficiency anemia, unspecified: Secondary | ICD-10-CM | POA: Diagnosis not present

## 2015-05-26 DIAGNOSIS — N2581 Secondary hyperparathyroidism of renal origin: Secondary | ICD-10-CM | POA: Diagnosis not present

## 2015-05-29 DIAGNOSIS — D509 Iron deficiency anemia, unspecified: Secondary | ICD-10-CM | POA: Diagnosis not present

## 2015-05-29 DIAGNOSIS — N2581 Secondary hyperparathyroidism of renal origin: Secondary | ICD-10-CM | POA: Diagnosis not present

## 2015-05-29 DIAGNOSIS — Z992 Dependence on renal dialysis: Secondary | ICD-10-CM | POA: Diagnosis not present

## 2015-05-29 DIAGNOSIS — N186 End stage renal disease: Secondary | ICD-10-CM | POA: Diagnosis not present

## 2015-05-30 ENCOUNTER — Encounter: Payer: Self-pay | Admitting: *Deleted

## 2015-05-30 ENCOUNTER — Encounter: Payer: Self-pay | Admitting: Vascular Surgery

## 2015-05-30 ENCOUNTER — Ambulatory Visit (INDEPENDENT_AMBULATORY_CARE_PROVIDER_SITE_OTHER): Payer: Medicare Other | Admitting: Vascular Surgery

## 2015-05-30 ENCOUNTER — Other Ambulatory Visit: Payer: Self-pay | Admitting: *Deleted

## 2015-05-30 VITALS — BP 125/70 | HR 95 | Temp 98.9°F | Resp 18 | Ht 65.0 in | Wt 180.0 lb

## 2015-05-30 DIAGNOSIS — Z992 Dependence on renal dialysis: Secondary | ICD-10-CM | POA: Diagnosis not present

## 2015-05-30 DIAGNOSIS — N186 End stage renal disease: Secondary | ICD-10-CM

## 2015-05-30 NOTE — Progress Notes (Signed)
Subjective:     Patient ID: [name], male   DOB: 10-19-1940, 75 y.o.   MRN: [id number]  HPI this 75 year old male with end-stage renal disease what referred by Dr.Befekadu for evaluation for vascular access. Patient has been on dialysis for 14 years currently on Monday Wednesday and Friday. He had a left upper arm AV fistula many many years ago. 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 Patient is right-handed.  Past Medical History  Diagnosis Date  . Hypertension   . CVA (cerebral infarction)   . Gastric ulcer 2004    treated for h.pylori see PSH  . LVH (left ventricular hypertrophy)   . Stroke ([place])   . ESRD (end stage renal disease) on dialysis ([place])   . Dialysis patient [facility])     Social History  Substance Use Topics  . Smoking status: Former Smoker    Quit date: 04/29/2004  . Smokeless tobacco: Former Systems developer    Types: [place] date: 01/16/1987     Comment: quit 2006  . Alcohol Use: No    Family History  Problem Relation Age of Onset  . Colon cancer Neg Hx   . Liver disease Neg Hx     Allergies  Allergen Reactions  . Aspirin Other (See Comments)    Causes internal bleeding      Current outpatient prescriptions:  .  amLODipine (NORVASC) 10 MG tablet, Take 10 mg by mouth daily., Disp: , Rfl:  .  b complex-vitamin c-folic acid (NEPHRO-VITE) 0.8 MG TABS, Take 0.8 mg by mouth as directed. Takes on Tuesdays, Thursdays, Saturdays, and Sundays. Does not take on Mondays, Wednesdays, and Fridays due to Dialysis treatments., Disp: , Rfl:  .  cinacalcet (SENSIPAR) 60 MG tablet, Take 60 mg by mouth every evening. With evening meal, Disp: , Rfl:  .  meclizine (ANTIVERT) 25 MG tablet, Take 1 tablet (25 mg total) by mouth 3 (three) times daily as needed for dizziness. (Patient taking differently: Take 25 mg by mouth daily. ), Disp: 30 tablet, Rfl: 0 .  sevelamer (RENVELA) 800 MG tablet,  Take 2,400-4,000 mg by mouth 3 (three) times daily with meals. Patient takes 5 tablets with meals and 3 tablets with snacks, Disp: , Rfl:  .  ALPRAZolam (XANAX) 0.5 MG tablet, Reported on 05/30/2015, Disp: , Rfl:  .  clopidogrel (PLAVIX) 75 MG tablet, Take 75 mg by mouth daily. Reported on 05/30/2015, Disp: , Rfl:  .  metoprolol succinate (TOPROL-XL) 50 MG 24 hr tablet, Take 50 mg by mouth daily. Reported on 05/30/2015, Disp: , Rfl:  .  omeprazole (PRILOSEC) 20 MG capsule, Take 20 mg by mouth daily. Reported on 05/30/2015, Disp: , Rfl:  .  promethazine (PHENERGAN) 25 MG tablet, Take 25 mg by mouth daily. Reported on 05/30/2015, Disp: , Rfl:   Filed Vitals:   05/30/15 0925  BP: 125/70  Pulse: 95  Temp: 98.9 F (37.2 C)  TempSrc: Oral  Resp: 18  Height: 5\' 5"  (1.651 m)  Weight: 180 lb (81.647 kg)  SpO2: 99%    Body mass index is 29.95 kg/(m^2).           Review of Systems patient has a history of mild CVA. No history of lateralizing weakness, aphasia, amaurosis fugax, diplopia, blurred vision, or syncope. S pain, dyspnea on exertion, PND, orthopnea. Other systems negative and complete review of systems     Objective:  Physical Exam BP 125/70 mmHg  Pulse 95  Temp(Src) 98.9 F (37.2 C) (Oral)  Resp 18  Ht 5\' 5"  (1.651 m)  Wt 180 lb (81.647 kg)  BMI 29.95 kg/m2  SpO2 99%  Gen.-alert and oriented x3 in no apparent distress HEENT normal for age Lungs no rhonchi or wheezing Cardiovascular regular rhythm no murmurs carotid pulses 3+ palpable no bruits audible Abdomen soft nontender no palpable masses Musculoskeletal free of  major deformities Skin clear -no rashes Neurologic normal Lower extremities 3+ femoral and dorsalis pedis pulses palpable bilaterally with no edema 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 There is a widely patent cephalic vein in the left forearm which feeds into the basilic system. 3+ radial pulse palpable on the  left. Right upper extremity with chronically occluded upper arm AV fistula.  Today our ordered vein mapping chart reviewed and interpreted. There is a large caliber left cephalic vein in the forearm which appears to be patent and into the basilic system in the upper arm. Both basilic veins right and left appear satisfactory for basilic vein transposition if needed.       Assessment:     End-stage renal disease with recent failure of right upper arm AV fistula-needs access    Plan:    plan left radial-cephalic AV fistula on Thursday, February 2. Patient has had history of steal syndrome apparently in right hand many years ago but denies any pain or numbness in the left hand at this time.

## 2015-05-31 ENCOUNTER — Encounter (HOSPITAL_COMMUNITY): Payer: Self-pay | Admitting: Emergency Medicine

## 2015-05-31 DIAGNOSIS — Z992 Dependence on renal dialysis: Secondary | ICD-10-CM | POA: Diagnosis not present

## 2015-05-31 DIAGNOSIS — N2581 Secondary hyperparathyroidism of renal origin: Secondary | ICD-10-CM | POA: Diagnosis not present

## 2015-05-31 DIAGNOSIS — N186 End stage renal disease: Secondary | ICD-10-CM | POA: Diagnosis not present

## 2015-05-31 MED ORDER — DEXTROSE 5 % IV SOLN: 1.5000 g | INTRAVENOUS | Status: DC

## 2015-05-31 NOTE — Progress Notes (Signed)
Anesthesia Chart Review:  Pt is a 75 year old male scheduled for L AV radiocephalic AV fistula creation on 06/01/2015 with Dr. Kellie Simmering  Pt is a same day work up.  PCP is Dr. [name].   PMH includes:  HTN, CVA, ESRD on HD. Former smoker. BMI 30.   Medications include: amlodipine, plavix, metoprolol, prilosec.   Pt will need labs DOS.   Chest x-ray 08/08/14 reviewed. Mild pulmonary edema.   EKG 03/04/15: Sinus rhythm. RBBB. Non-specific ST-t changes  Echo 12/01/09:  - Left ventricle: The cavity size was normal. Moderate LVH with disproportionate upper septal thickening, Systolic function was moderately to severely reduced. The estimated ejection fraction was in the range of 30% to 35%. Moderate diffuse hypokinesis. - Aortic valve: Mildly calcified annulus. Moderately thickened, moderately calcified leaflets. Cusp separation was mildly reduced. 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 Valve area: 0.93cm^2(VTI). Valve area: 1.08cm^2 (Vmax). - Mitral valve: Moderately calcified annulus. - Left atrium: The atrium was mildly to moderately dilated. - Right ventricle: The cavity size was normal. Wall thickness was increased.  I spoke with Dr. Josephine Cables staff to see if pt has seen cardiology or had a follow up record. PCP's office has no records of this. We have been unable to reach pt today. Reviewed case with Dr. Lissa Hoard. Pt needs to see cardiology to have issues noted on above echo addressed prior to surgery. Left voicemail for [facility] in Dr. Evelena Leyden office.   Willeen Cass, FNP-BC [facility] Short Stay Surgical [facility]/Anesthesiology Phone: [phone] 05/31/2015 12:34 PM

## 2015-06-01 ENCOUNTER — Encounter (HOSPITAL_COMMUNITY): Admission: RE | Payer: Self-pay | Source: Ambulatory Visit

## 2015-06-01 ENCOUNTER — Ambulatory Visit (HOSPITAL_COMMUNITY): Admission: RE | Admit: 2015-06-01 | Payer: Medicare Other | Source: Ambulatory Visit | Admitting: Vascular Surgery

## 2015-06-01 ENCOUNTER — Encounter: Payer: Self-pay | Admitting: Nephrology

## 2015-06-01 SURGERY — ARTERIOVENOUS (AV) FISTULA CREATION
Anesthesia: Monitor Anesthesia Care | Laterality: Left

## 2015-06-02 DIAGNOSIS — N2581 Secondary hyperparathyroidism of renal origin: Secondary | ICD-10-CM | POA: Diagnosis not present

## 2015-06-02 DIAGNOSIS — N186 End stage renal disease: Secondary | ICD-10-CM | POA: Diagnosis not present

## 2015-06-02 DIAGNOSIS — Z992 Dependence on renal dialysis: Secondary | ICD-10-CM | POA: Diagnosis not present

## 2015-06-05 DIAGNOSIS — Z992 Dependence on renal dialysis: Secondary | ICD-10-CM | POA: Diagnosis not present

## 2015-06-05 DIAGNOSIS — N186 End stage renal disease: Secondary | ICD-10-CM | POA: Diagnosis not present

## 2015-06-05 DIAGNOSIS — N2581 Secondary hyperparathyroidism of renal origin: Secondary | ICD-10-CM | POA: Diagnosis not present

## 2015-06-07 ENCOUNTER — Encounter: Payer: Self-pay | Admitting: Cardiology

## 2015-06-07 DIAGNOSIS — N186 End stage renal disease: Secondary | ICD-10-CM | POA: Diagnosis not present

## 2015-06-07 DIAGNOSIS — Z992 Dependence on renal dialysis: Secondary | ICD-10-CM | POA: Diagnosis not present

## 2015-06-07 DIAGNOSIS — N2581 Secondary hyperparathyroidism of renal origin: Secondary | ICD-10-CM | POA: Diagnosis not present

## 2015-06-09 DIAGNOSIS — N2581 Secondary hyperparathyroidism of renal origin: Secondary | ICD-10-CM | POA: Diagnosis not present

## 2015-06-09 DIAGNOSIS — Z992 Dependence on renal dialysis: Secondary | ICD-10-CM | POA: Diagnosis not present

## 2015-06-09 DIAGNOSIS — N186 End stage renal disease: Secondary | ICD-10-CM | POA: Diagnosis not present

## 2015-06-12 DIAGNOSIS — N186 End stage renal disease: Secondary | ICD-10-CM | POA: Diagnosis not present

## 2015-06-12 DIAGNOSIS — N2581 Secondary hyperparathyroidism of renal origin: Secondary | ICD-10-CM | POA: Diagnosis not present

## 2015-06-12 DIAGNOSIS — Z992 Dependence on renal dialysis: Secondary | ICD-10-CM | POA: Diagnosis not present

## 2015-06-13 DIAGNOSIS — L97511 Non-pressure chronic ulcer of other part of right foot limited to breakdown of skin: Secondary | ICD-10-CM | POA: Diagnosis not present

## 2015-06-13 DIAGNOSIS — E1142 Type 2 diabetes mellitus with diabetic polyneuropathy: Secondary | ICD-10-CM | POA: Diagnosis not present

## 2015-06-14 DIAGNOSIS — N186 End stage renal disease: Secondary | ICD-10-CM | POA: Diagnosis not present

## 2015-06-14 DIAGNOSIS — Z992 Dependence on renal dialysis: Secondary | ICD-10-CM | POA: Diagnosis not present

## 2015-06-14 DIAGNOSIS — N2581 Secondary hyperparathyroidism of renal origin: Secondary | ICD-10-CM | POA: Diagnosis not present

## 2015-06-15 ENCOUNTER — Ambulatory Visit (INDEPENDENT_AMBULATORY_CARE_PROVIDER_SITE_OTHER): Payer: Medicare Other | Admitting: Cardiology

## 2015-06-15 ENCOUNTER — Encounter: Payer: Self-pay | Admitting: Cardiology

## 2015-06-15 VITALS — BP 170/94 | HR 96 | Ht 65.0 in | Wt 184.8 lb

## 2015-06-15 DIAGNOSIS — Z01810 Encounter for preprocedural cardiovascular examination: Secondary | ICD-10-CM | POA: Diagnosis not present

## 2015-06-15 DIAGNOSIS — I350 Nonrheumatic aortic (valve) stenosis: Secondary | ICD-10-CM

## 2015-06-15 DIAGNOSIS — N186 End stage renal disease: Secondary | ICD-10-CM | POA: Diagnosis not present

## 2015-06-15 DIAGNOSIS — Z992 Dependence on renal dialysis: Secondary | ICD-10-CM | POA: Diagnosis not present

## 2015-06-15 NOTE — Patient Instructions (Signed)
Medication Instructions:  The current medical regimen is effective;  continue present plan and medications.  Testing/Procedures: Your physician has requested that you have an echocardiogram. Echocardiography is a painless test that uses sound waves to create images of your heart. It provides your doctor with information about the size and shape of your heart and how well your heart's chambers and valves are working. This procedure takes approximately one hour. There are no restrictions for this procedure.  Your physician has requested that you have a lexiscan myoview. For further information please visit [email or web address]. Please follow instruction sheet, as given.  Follow-Up: Follow up in 2 weeks with Dr Marlou Porch or as soon as possible after the testing as been completed.  If you need a refill on your cardiac medications before your next appointment, please call your pharmacy.  Thank you for choosing [facility]!!

## 2015-06-15 NOTE — Progress Notes (Signed)
Cardiology Office Note    Date:  06/15/2015   ID:  [name], DOB July 26, 1940, MRN [id number]  PCP:  [name], MD  Cardiologist:   Candee Furbish, MD     History of Present Illness:  [name] is a 75 y.o. 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 Quit tob 10 years ago.    overall he is not feeling any chest pain, no shortness of breath, no syncope. Ambulates without difficulty. Minor left leg dragging sensation from his prior stroke.    Past Medical History  Diagnosis Date  . Hypertension   . CVA (cerebral infarction)   . Gastric ulcer 2004    treated for h.pylori see PSH  . LVH (left ventricular hypertrophy)   . Stroke ([place])   . ESRD (end stage renal disease) on dialysis ([place])   . Dialysis patient [facility])     Past Surgical History  Procedure Laterality Date  . Cholecystectomy    . Shoulder surgery    . Colonoscopy  2004    Dr. Irving Shows, left sided diverticula and cecal polyp, path unknown  . Esophagogastroduodenoscopy  11/2002    Dr. Gala Romney, erosive reflux esophagitis, multiple gastric ulcer and antral/bulbar erosions. Serologies positive for H.Pylori and was treated  . Esophagogastroduodenoscopy  11/20014    Dr. Gala Romney, small hh only, ulcers healed  . Esophagogastroduodenoscopy  09/21/2011    Dr Trevor Iha [facility], antral erosions, ?early GAVE  . Colonoscopy  10/29/2011    Procedure: COLONOSCOPY;  Surgeon: Daneil Dolin, MD;  [facility];  Service: Endoscopy;  Laterality: N/A;  10:15  . Arteriovenous graft placement Right right arm  . Abdominal aortagram N/A 01/24/2012    Procedure: ABDOMINAL Maxcine Ham;  Surgeon: Elam Dutch, MD;  Location: [facility] CATH LAB;  Service:  Cardiovascular;  Laterality: N/A;  . Carpel tunnel Left Dec. 22, 2016    Outpatient Prescriptions Prior to Visit  Medication Sig Dispense Refill  . ALPRAZolam (XANAX) 0.5 MG tablet Take 0.5 mg by mouth at bedtime as needed for anxiety.     Marland Kitchen amLODipine (NORVASC) 10 MG tablet Take 10 mg by mouth daily.    Marland Kitchen b complex-vitamin c-folic acid (NEPHRO-VITE) 0.8 MG TABS Take 0.8 mg by mouth as directed. Takes on Tuesdays, Thursdays, Saturdays, and Sundays. Does not take on Mondays, Wednesdays, and Fridays due to Dialysis treatments.    . cinacalcet (SENSIPAR) 60 MG tablet Take 60 mg by mouth every evening. With evening meal    . clopidogrel (PLAVIX) 75 MG tablet Take 75 mg by mouth daily. Reported on 05/30/2015    . metoprolol succinate (TOPROL-XL) 50 MG 24 hr tablet Take 50 mg by mouth daily. Reported on 05/30/2015    . omeprazole (PRILOSEC) 20 MG capsule Take 20 mg by mouth daily. Reported on 05/30/2015    . promethazine (PHENERGAN) 25 MG tablet Take 25 mg by mouth daily. Reported on 05/30/2015    . sevelamer (RENVELA) 800 MG tablet Take 2,400-4,000 mg by mouth 3 (three) times daily with meals. Patient takes 5 tablets with meals and 3 tablets with snacks    . meclizine (ANTIVERT) 25 MG tablet Take 1 tablet (25 mg total) by mouth 3 (three) times daily as needed for dizziness. (Patient taking differently:  Take 25 mg by mouth daily. ) 30 tablet 0   No facility-administered medications prior to visit.     Allergies:   Aspirin   Social History   Social History  . Marital Status: Single    Spouse Name: N/A  . Number of Children: 2  . Years of Education: N/A   Occupational History  . retired, [employer]    Social History Main Topics  . Smoking status: Former Smoker    Quit date: 04/29/2004  . Smokeless tobacco: Former Systems developer    Types: [place] date: 01/16/1987     Comment: quit 2006  . Alcohol Use: No  . Drug Use: No  . Sexual Activity: Not Asked   Other Topics Concern  . None    Social History Narrative   Lives alone   Daughter 20-min away   Caffeine use: 32oz soda per day     Family History:  The patient's family history includes Hypertension in his mother. There is no history of Colon cancer or Liver disease. his mother died when he was very young at age 84. His father died in his 80s or 22s. He does not believe that there was any heart disease.   ROS:   Please see the history of present illness.    ROS denies any fevers, chills, orthopnea, PND, bleeding, syncope. All other systems reviewed and are negative.   PHYSICAL EXAM:   VS:  BP 170/94 mmHg  Pulse 96  Ht 5\' 5"  (1.651 m)  Wt 184 lb 12.8 oz (83.825 kg)  BMI 30.75 kg/m2   GEN: Well nourished, well developed, in no acute distress HEENT: normal Neck: no JVD, carotid bruits, or masses Cardiac: RRR; 3/6 SEM, no rubs, or gallops,no edema  dialysis catheter left chest noted, prior right arm fistula noted  Respiratory:  clear to auscultation bilaterally, normal work of breathing GI: soft, nontender, nondistended, + BS MS: no deformity or atrophy Skin: warm and dry, no rash Neuro:  Alert and Oriented x 3, Strength and sensation are intact Psych: euthymic mood, full affect  Wt Readings from Last 3 Encounters:  06/15/15 184 lb 12.8 oz (83.825 kg)  05/30/15 180 lb (81.647 kg)  05/09/15 184 lb (83.462 kg)      Studies/Labs Reviewed:   EKG:  EKG is not ordered today.   EKG from 03/06/15 shows sinus rhythm, right bundle branch block with nonspecific ST-T wave changes possible lateral ischemia   Recent Labs: 03/04/2015: BUN 22*; Creatinine, Ser 9.89*; Hemoglobin 14.5; Platelets 294; Potassium 3.7; Sodium 132*   Lipid Panel    Component Value Date/Time   CHOL  12/01/2009 0145    140        ATP III CLASSIFICATION:  <200     mg/dL   Desirable  200-239  mg/dL   Borderline High  >=240    mg/dL   High          TRIG 122 12/01/2009 0145   HDL 31* 12/01/2009 0145   CHOLHDL 4.5 12/01/2009 0145   VLDL  24 12/01/2009 0145   LDLCALC  12/01/2009 0145    85        Total Cholesterol/HDL:CHD Risk Coronary Heart Disease Risk Table                     Men   Women  1/2 Average Risk   3.4   3.3  Average Risk       5.0  4.4  2 X Average Risk   9.6   7.1  3 X Average Risk  23.4   11.0        Use the calculated Patient Ratio above and the CHD Risk Table to determine the patient's CHD Risk.        ATP III CLASSIFICATION (LDL):  <100     mg/dL   Optimal  100-129  mg/dL   Near or Above                    Optimal  130-159  mg/dL   Borderline  160-189  mg/dL   High  >190     mg/dL   Very High    Additional studies/ records that were reviewed today include:  ECHO 12/01/09: - Left ventricle: The cavity size was normal. Moderate LVH with  disproportionate upper septal thickening, Systolic function was  moderately to severely reduced. The estimated ejection fraction  was in the range of 30% to 35%. Moderate diffuse hypokinesis. - Aortic valve: Mildly calcified annulus. Moderately thickened,  moderately calcified leaflets. Cusp separation was mildly reduced.  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 Valve area: 0.93cm^2(VTI). Valve area:  1.08cm^2 (Vmax). - Mitral valve: Moderately calcified annulus. - Left atrium: The atrium was mildly to moderately dilated. - Right ventricle: The cavity size was normal. Wall thickness was  increased.    ASSESSMENT:    1. End stage renal disease ([place])   2. ESRD on dialysis [facility])      PLAN:  In order of problems listed above:  1. Preoperative risk stratification prior to AV fistula placement - I will check a pharmacologic stress test as well as echocardiogram for further restriction of medication. Prior EF on echocardiogram in 2011 was 30-35%. I wonder if this has improved. I do appreciate aortic valve murmur on exam today. I wonder if aortic valve stenosis has increased in severity. We will need to answer these questions prior to proceeding with  general anesthesia. If cardiomyopathy exists or if nuclear stress test is significantly abnormal, further evaluation with coronary angiography to exclude significant underlying triple-vessel coronary artery disease may need to take place. 2. Cardiomyopathy-he is currently on Toprol. He is not on ACE inhibitor because of end-stage renal disease. Continue to treat hypertension. Possibly hypertensive cardiomyopathy but cannot exclude ischemic. Retesting since it is been approximate 6 years since his last evaluation of ejection fraction is taking place. 3. End-stage renal disease on hemodialysis-currently being dialyzed through catheter. 4. History of stroke in 2000. Plavix. 5. Peripheral vascular disease-prior atherectomy of anterior tibial artery, right 2012    Medication Adjustments/Labs and Tests Ordered: Current medicines are reviewed at length with the patient today.  Concerns regarding medicines are outlined above.  Medication changes, Labs and Tests ordered today are listed in the Patient Instructions below. There are no Patient Instructions on file for this visit.     Bobby Rumpf, MD  06/15/2015 1:46 PM    [facility] [place], [place], [place]  [address] Phone: [phone]; Fax: [phone]

## 2015-06-16 DIAGNOSIS — N186 End stage renal disease: Secondary | ICD-10-CM | POA: Diagnosis not present

## 2015-06-16 DIAGNOSIS — Z992 Dependence on renal dialysis: Secondary | ICD-10-CM | POA: Diagnosis not present

## 2015-06-16 DIAGNOSIS — N2581 Secondary hyperparathyroidism of renal origin: Secondary | ICD-10-CM | POA: Diagnosis not present

## 2015-06-19 DIAGNOSIS — Z992 Dependence on renal dialysis: Secondary | ICD-10-CM | POA: Diagnosis not present

## 2015-06-19 DIAGNOSIS — N186 End stage renal disease: Secondary | ICD-10-CM | POA: Diagnosis not present

## 2015-06-19 DIAGNOSIS — N2581 Secondary hyperparathyroidism of renal origin: Secondary | ICD-10-CM | POA: Diagnosis not present

## 2015-06-21 DIAGNOSIS — N2581 Secondary hyperparathyroidism of renal origin: Secondary | ICD-10-CM | POA: Diagnosis not present

## 2015-06-21 DIAGNOSIS — Z992 Dependence on renal dialysis: Secondary | ICD-10-CM | POA: Diagnosis not present

## 2015-06-21 DIAGNOSIS — N186 End stage renal disease: Secondary | ICD-10-CM | POA: Diagnosis not present

## 2015-06-22 ENCOUNTER — Telehealth (HOSPITAL_COMMUNITY): Payer: Self-pay | Admitting: *Deleted

## 2015-06-22 NOTE — Telephone Encounter (Signed)
Attempted to call patient regarding upcoming appointment- no answer. [name] J Binnie Vonderhaar, RN 

## 2015-06-23 DIAGNOSIS — N186 End stage renal disease: Secondary | ICD-10-CM | POA: Diagnosis not present

## 2015-06-23 DIAGNOSIS — N2581 Secondary hyperparathyroidism of renal origin: Secondary | ICD-10-CM | POA: Diagnosis not present

## 2015-06-23 DIAGNOSIS — Z992 Dependence on renal dialysis: Secondary | ICD-10-CM | POA: Diagnosis not present

## 2015-06-26 ENCOUNTER — Telehealth (HOSPITAL_COMMUNITY): Payer: Self-pay | Admitting: *Deleted

## 2015-06-26 ENCOUNTER — Telehealth: Payer: Self-pay | Admitting: *Deleted

## 2015-06-26 DIAGNOSIS — N186 End stage renal disease: Secondary | ICD-10-CM | POA: Diagnosis not present

## 2015-06-26 DIAGNOSIS — N2581 Secondary hyperparathyroidism of renal origin: Secondary | ICD-10-CM | POA: Diagnosis not present

## 2015-06-26 DIAGNOSIS — Z992 Dependence on renal dialysis: Secondary | ICD-10-CM | POA: Diagnosis not present

## 2015-06-26 NOTE — Telephone Encounter (Signed)
Returning your call. °

## 2015-06-26 NOTE — Telephone Encounter (Signed)
Attempted to call patient regarding upcoming appointment- no answer. Eros Montour J Lacosta Hargan, RN 

## 2015-06-27 ENCOUNTER — Other Ambulatory Visit: Payer: Self-pay

## 2015-06-27 ENCOUNTER — Ambulatory Visit (HOSPITAL_BASED_OUTPATIENT_CLINIC_OR_DEPARTMENT_OTHER): Payer: Medicare Other

## 2015-06-27 ENCOUNTER — Ambulatory Visit (HOSPITAL_COMMUNITY): Payer: Medicare Other | Attending: Cardiology

## 2015-06-27 DIAGNOSIS — I340 Nonrheumatic mitral (valve) insufficiency: Secondary | ICD-10-CM | POA: Insufficient documentation

## 2015-06-27 DIAGNOSIS — N186 End stage renal disease: Secondary | ICD-10-CM | POA: Diagnosis not present

## 2015-06-27 DIAGNOSIS — L97511 Non-pressure chronic ulcer of other part of right foot limited to breakdown of skin: Secondary | ICD-10-CM | POA: Diagnosis not present

## 2015-06-27 DIAGNOSIS — Z01810 Encounter for preprocedural cardiovascular examination: Secondary | ICD-10-CM

## 2015-06-27 DIAGNOSIS — I352 Nonrheumatic aortic (valve) stenosis with insufficiency: Secondary | ICD-10-CM | POA: Diagnosis not present

## 2015-06-27 DIAGNOSIS — I1311 Hypertensive heart and chronic kidney disease without heart failure, with stage 5 chronic kidney disease, or end stage renal disease: Secondary | ICD-10-CM | POA: Insufficient documentation

## 2015-06-27 DIAGNOSIS — R9439 Abnormal result of other cardiovascular function study: Secondary | ICD-10-CM | POA: Diagnosis not present

## 2015-06-27 DIAGNOSIS — Z992 Dependence on renal dialysis: Secondary | ICD-10-CM

## 2015-06-27 DIAGNOSIS — R0609 Other forms of dyspnea: Secondary | ICD-10-CM | POA: Insufficient documentation

## 2015-06-27 DIAGNOSIS — I350 Nonrheumatic aortic (valve) stenosis: Secondary | ICD-10-CM | POA: Diagnosis not present

## 2015-06-27 DIAGNOSIS — E1142 Type 2 diabetes mellitus with diabetic polyneuropathy: Secondary | ICD-10-CM | POA: Diagnosis not present

## 2015-06-27 LAB — MYOCARDIAL PERFUSION IMAGING
CHL CUP NUCLEAR SDS: 0
CHL CUP NUCLEAR SRS: 6
CHL CUP NUCLEAR SSS: 6
CSEPPHR: 96 {beats}/min
LHR: 0.29
LV dias vol: 124 mL
LV sys vol: 79 mL
Rest HR: 84 {beats}/min
TID: 0.79

## 2015-06-27 MED ORDER — REGADENOSON 0.4 MG/5ML IV SOLN
0.4000 mg | Freq: Once | INTRAVENOUS | Status: AC
  Administered 2015-06-27: 0.4 mg via INTRAVENOUS

## 2015-06-27 MED ORDER — TECHNETIUM TC 99M SESTAMIBI GENERIC - CARDIOLITE
10.3000 | Freq: Once | INTRAVENOUS | Status: AC | PRN
  Administered 2015-06-27: 10 via INTRAVENOUS

## 2015-06-27 MED ORDER — TECHNETIUM TC 99M SESTAMIBI GENERIC - CARDIOLITE
32.8000 | Freq: Once | INTRAVENOUS | Status: AC | PRN
  Administered 2015-06-27: 32.8 via INTRAVENOUS

## 2015-06-28 ENCOUNTER — Telehealth: Payer: Self-pay | Admitting: Cardiology

## 2015-06-28 DIAGNOSIS — Z992 Dependence on renal dialysis: Secondary | ICD-10-CM | POA: Diagnosis not present

## 2015-06-28 DIAGNOSIS — N186 End stage renal disease: Secondary | ICD-10-CM | POA: Diagnosis not present

## 2015-06-28 DIAGNOSIS — N2581 Secondary hyperparathyroidism of renal origin: Secondary | ICD-10-CM | POA: Diagnosis not present

## 2015-06-28 NOTE — Telephone Encounter (Signed)
Request for surgical clearance:  1. What type of surgery is being performed? Left arm Fistula   2. When is this surgery scheduled? Pending   3. Are there any medications that need to be held prior to surgery and how long?did not say   4. Name of physician performing surgery? Dr. Tinnie Gens   5. What is your office phone and fax number? [phone] 6.

## 2015-06-29 NOTE — Telephone Encounter (Signed)
Informed pt of echo and stress test results and need for appt to come in and discuss cath.   Pt verbalized understanding. Pt has dialysis on Mon, Wed and Fri so unable to come in on those days. Scheduled pt to be seen on 07/06/15 with Dr. Marlou Porch.

## 2015-06-29 NOTE — Telephone Encounter (Signed)
Needs to come in to discuss cath. Abnormal stress and echo. See result notes from these studies.   Candee Furbish, MD

## 2015-06-30 DIAGNOSIS — Z992 Dependence on renal dialysis: Secondary | ICD-10-CM | POA: Diagnosis not present

## 2015-06-30 DIAGNOSIS — N186 End stage renal disease: Secondary | ICD-10-CM | POA: Diagnosis not present

## 2015-06-30 DIAGNOSIS — N2581 Secondary hyperparathyroidism of renal origin: Secondary | ICD-10-CM | POA: Diagnosis not present

## 2015-07-03 DIAGNOSIS — N186 End stage renal disease: Secondary | ICD-10-CM | POA: Diagnosis not present

## 2015-07-03 DIAGNOSIS — N2581 Secondary hyperparathyroidism of renal origin: Secondary | ICD-10-CM | POA: Diagnosis not present

## 2015-07-03 DIAGNOSIS — Z992 Dependence on renal dialysis: Secondary | ICD-10-CM | POA: Diagnosis not present

## 2015-07-05 DIAGNOSIS — Z992 Dependence on renal dialysis: Secondary | ICD-10-CM | POA: Diagnosis not present

## 2015-07-05 DIAGNOSIS — N2581 Secondary hyperparathyroidism of renal origin: Secondary | ICD-10-CM | POA: Diagnosis not present

## 2015-07-05 DIAGNOSIS — N186 End stage renal disease: Secondary | ICD-10-CM | POA: Diagnosis not present

## 2015-07-06 ENCOUNTER — Encounter: Payer: Self-pay | Admitting: *Deleted

## 2015-07-06 ENCOUNTER — Encounter: Payer: Self-pay | Admitting: Cardiology

## 2015-07-06 ENCOUNTER — Ambulatory Visit (INDEPENDENT_AMBULATORY_CARE_PROVIDER_SITE_OTHER): Payer: Medicare Other | Admitting: Cardiology

## 2015-07-06 VITALS — BP 152/88 | HR 96 | Ht 65.0 in | Wt 180.4 lb

## 2015-07-06 DIAGNOSIS — R9439 Abnormal result of other cardiovascular function study: Secondary | ICD-10-CM

## 2015-07-06 DIAGNOSIS — N186 End stage renal disease: Secondary | ICD-10-CM

## 2015-07-06 DIAGNOSIS — I739 Peripheral vascular disease, unspecified: Secondary | ICD-10-CM | POA: Diagnosis not present

## 2015-07-06 DIAGNOSIS — Z01810 Encounter for preprocedural cardiovascular examination: Secondary | ICD-10-CM

## 2015-07-06 LAB — CBC
HCT: 44.3 % (ref 39.0–52.0)
Hemoglobin: 14.2 g/dL (ref 13.0–17.0)
MCH: 20.8 pg — AB (ref 26.0–34.0)
MCHC: 32.1 g/dL (ref 30.0–36.0)
MCV: 64.9 fL — AB (ref 78.0–100.0)
Platelets: 330 10*3/uL (ref 150–400)
RBC: 6.83 MIL/uL — AB (ref 4.22–5.81)
RDW: 21.1 % — ABNORMAL HIGH (ref 11.5–15.5)
WBC: 9.7 10*3/uL (ref 4.0–10.5)

## 2015-07-06 LAB — BASIC METABOLIC PANEL
BUN: 32 mg/dL — AB (ref 7–25)
CHLORIDE: 96 mmol/L — AB (ref 98–110)
CO2: 21 mmol/L (ref 20–31)
Calcium: 9.5 mg/dL (ref 8.6–10.3)
Creat: 10.8 mg/dL — ABNORMAL HIGH (ref 0.70–1.18)
GLUCOSE: 107 mg/dL — AB (ref 65–99)
POTASSIUM: 4 mmol/L (ref 3.5–5.3)
Sodium: 135 mmol/L (ref 135–146)

## 2015-07-06 NOTE — H&P (Signed)
Cardiology Office Note    Date:  07/06/2015   ID:  [name], DOB Sep 07, 1940, MRN [id number]  PCP:  [name], MD  Cardiologist:   Candee Furbish, MD     History of Present Illness:  [name] is a 75 y.o. 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 Quit tob 10 years ago.    overall he is not feeling any chest pain, no shortness of breath, no syncope. Ambulates without difficulty. Minor left leg dragging sensation from his prior stroke.  His nuclear stress to came back demonstrating large inferior infarct with ejection fraction of 30% range. Echo also confirms cardiomyopathy. Prior to his upcoming general anesthesia, I think it would be prudent to exclude the possibility of severe triple-vessel coronary artery disease with angiogram.  Past Medical History  Diagnosis Date  . Hypertension   . CVA (cerebral infarction)   . Gastric ulcer 2004    treated for h.pylori see PSH  . LVH (left ventricular hypertrophy)   . Stroke ([place])   . ESRD (end stage renal disease) on dialysis ([place])   . Dialysis patient [facility])     Past Surgical History  Procedure Laterality Date  . Cholecystectomy    . Shoulder surgery    . Colonoscopy  2004    Dr. Irving Shows, left sided diverticula and cecal polyp, path unknown  . Esophagogastroduodenoscopy  11/2002    Dr. Gala Romney, erosive reflux esophagitis, multiple gastric ulcer and antral/bulbar erosions. Serologies positive for H.Pylori and was treated  . Esophagogastroduodenoscopy  11/20014    Dr. Gala Romney, small hh only, ulcers healed  . Esophagogastroduodenoscopy  09/21/2011    Dr Trevor Iha [facility], antral erosions, ?early GAVE  . Colonoscopy  10/29/2011    Procedure: COLONOSCOPY;   Surgeon: Daneil Dolin, MD;  [facility];  Service: Endoscopy;  Laterality: N/A;  10:15  . Arteriovenous graft placement Right right arm  . Abdominal aortagram N/A 01/24/2012    Procedure: ABDOMINAL Maxcine Ham;  Surgeon: Elam Dutch, MD;  Location: [facility] CATH LAB;  Service: Cardiovascular;  Laterality: N/A;  . Carpel tunnel Left Dec. 22, 2016    Outpatient Prescriptions Prior to Visit  Medication Sig Dispense Refill  . ALPRAZolam (XANAX) 0.5 MG tablet Take 0.5 mg by mouth at bedtime as needed for anxiety.     Marland Kitchen amLODipine (NORVASC) 10 MG tablet Take 10 mg by mouth daily.    Marland Kitchen b complex-vitamin c-folic acid (NEPHRO-VITE) 0.8 MG TABS Take 0.8 mg by mouth as directed. Takes on Tuesdays, Thursdays, Saturdays, and Sundays. Does not take on Mondays, Wednesdays, and Fridays due to Dialysis treatments.    . cinacalcet (SENSIPAR) 60 MG tablet Take 60 mg by mouth every evening. With evening meal    . clopidogrel (PLAVIX) 75 MG tablet Take 75 mg by mouth daily. Reported on 05/30/2015    . meclizine (ANTIVERT) 25 MG tablet Take 25 mg by mouth daily.    . metoprolol succinate (TOPROL-XL) 50 MG 24 hr tablet Take 50 mg by mouth daily. Reported on 05/30/2015    . omeprazole (PRILOSEC) 20 MG capsule Take 20 mg by mouth daily. Reported on 05/30/2015    . promethazine (PHENERGAN) 25 MG tablet Take 25 mg by mouth every 6 (six) hours as  needed for nausea. Reported on 05/30/2015    . sevelamer (RENVELA) 800 MG tablet Take 2,400-4,000 mg by mouth 3 (three) times daily with meals. Patient takes 5 tablets with meals and 3 tablets with snacks     No facility-administered medications prior to visit.     Allergies:   Aspirin   Social History   Social History  . Marital Status: Single    Spouse Name: N/A  . Number of Children: 2  . Years of Education: N/A   Occupational History  . retired, [employer]    Social History Main Topics  . Smoking status: Former Smoker    Quit date: 04/29/2004  .  Smokeless tobacco: Former Systems developer    Types: [place] date: 01/16/1987     Comment: quit 2006  . Alcohol Use: No  . Drug Use: No  . Sexual Activity: Not Asked   Other Topics Concern  . None   Social History Narrative   Lives alone   Daughter 20-min away   Caffeine use: 32oz soda per day     Family History:  The patient's family history includes Hypertension in his mother. There is no history of Colon cancer or Liver disease. his mother died when he was very young at age 78. His father died in his 48s or 21s. He does not believe that there was any heart disease.   ROS:   Please see the history of present illness.    ROS denies any fevers, chills, orthopnea, PND, bleeding, syncope. All other systems reviewed and are negative.   PHYSICAL EXAM:   VS:  BP 152/88 mmHg  Pulse 96  Ht 5\' 5"  (1.651 m)  Wt 180 lb 6.4 oz (81.829 kg)  BMI 30.02 kg/m2   GEN: Well nourished, well developed, in no acute distress HEENT: normal Neck: no JVD, carotid bruits, or masses Cardiac: RRR; 3/6 SEM, no rubs, or gallops,no edema  dialysis catheter left chest noted, prior right arm fistula noted  Respiratory:  clear to auscultation bilaterally, normal work of breathing GI: soft, nontender, nondistended, + BS MS: no deformity or atrophy Skin: warm and dry, no rash Neuro:  Alert and Oriented x 3, Strength and sensation are intact Psych: euthymic mood, full affect  Wt Readings from Last 3 Encounters:  07/06/15 180 lb 6.4 oz (81.829 kg)  06/27/15 184 lb (83.462 kg)  06/15/15 184 lb 12.8 oz (83.825 kg)      Studies/Labs Reviewed:   EKG:  EKG is not ordered today.   EKG from 03/06/15 shows sinus rhythm, right bundle branch block with nonspecific ST-T wave changes possible lateral ischemia   Recent Labs: 03/04/2015: BUN 22*; Creatinine, Ser 9.89*; Hemoglobin 14.5; Platelets 294; Potassium 3.7; Sodium 132*   Lipid Panel    Component Value Date/Time   CHOL  12/01/2009 0145    140        ATP III  CLASSIFICATION:  <200     mg/dL   Desirable  200-239  mg/dL   Borderline High  >=240    mg/dL   High          TRIG 122 12/01/2009 0145   HDL 31* 12/01/2009 0145   CHOLHDL 4.5 12/01/2009 0145   VLDL 24 12/01/2009 0145   [place]  12/01/2009 0145    85        Total Cholesterol/HDL:CHD Risk Coronary Heart Disease Risk Table  Men   Women  1/2 Average Risk   3.4   3.3  Average Risk       5.0   4.4  2 X Average Risk   9.6   7.1  3 X Average Risk  23.4   11.0        Use the calculated Patient Ratio above and the CHD Risk Table to determine the patient's CHD Risk.        ATP III CLASSIFICATION (LDL):  <100     mg/dL   Optimal  100-129  mg/dL   Near or Above                    Optimal  130-159  mg/dL   Borderline  160-189  mg/dL   High  >190     mg/dL   Very High    Additional studies/ records that were reviewed today include:  ECHO 12/01/09: - Left ventricle: The cavity size was normal. Moderate LVH with  disproportionate upper septal thickening, Systolic function was  moderately to severely reduced. The estimated ejection fraction  was in the range of 30% to 35%. Moderate diffuse hypokinesis. - Aortic valve: Mildly calcified annulus. Moderately thickened,  moderately calcified leaflets. Cusp separation was mildly reduced.  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 Valve area: 0.93cm^2(VTI). Valve area:  1.08cm^2 (Vmax). - Mitral valve: Moderately calcified annulus. - Left atrium: The atrium was mildly to moderately dilated. - Right ventricle: The cavity size was normal. Wall thickness was  increased.  06/27/15 echocardiogram-reported as normal ejection fraction however upon further inspection, appears to be reduced, EF on nuclear stress test was in the 30% range, inferior wall motion abnormality noted. Also has moderate aortic stenosis present.  ASSESSMENT:    1. Abnormal stress test   2. End stage renal disease ([place])   3. Peripheral vascular disease,  unspecified ([place])   4. Pre-operative cardiovascular examination      PLAN:  In order of problems listed above:  1. Preoperative risk stratification prior to AV fistula placement -Significantly abnormal stress test with inferior wall infarct, reduced ejection fraction in the 30% range- further evaluation with coronary angiography to exclude significant underlying triple-vessel coronary artery disease may need to take place. He will need to be femoral access given prior fistulas. We discussed risk of stroke, leading, death, myocardial infarction. Understands, willing to proceed. 2. Cardiomyopathy-he is currently on Toprol. He is not on ACE inhibitor because of end-stage renal disease. Continue to treat hypertension. Possibly hypertensive cardiomyopathy but cannot exclude ischemic.  3. End-stage renal disease on hemodialysis-currently being dialyzed through catheter. 4. History of stroke in 2000. Plavix. 5. Peripheral vascular disease-prior atherectomy of anterior tibial artery, right 2012  Orders for cath written.  Medication Adjustments/Labs and Tests Ordered: Current medicines are reviewed at length with the patient today.  Concerns regarding medicines are outlined above.  Medication changes, Labs and Tests ordered today are listed in the Patient Instructions below. There are no Patient Instructions on file for this visit.     Bobby Rumpf, MD  07/06/2015 11:43 AM    [facility] [place], [place], [place]  [address] Phone: [phone]; Fax: [phone]

## 2015-07-06 NOTE — Patient Instructions (Addendum)
Medication Instructions:  The current medical regimen is effective;  continue present plan and medications.  Labwork: Please have blood work today (BMP, CBC and PT/INR)  Testing/Procedures: Your physician has requested that you have a cardiac catheterization. Cardiac catheterization is used to diagnose and/or treat various heart conditions. Doctors may recommend this procedure for a number of different reasons. The most common reason is to evaluate chest pain. Chest pain can be a symptom of coronary artery disease (CAD), and cardiac catheterization can show whether plaque is narrowing or blocking your heart's arteries. This procedure is also used to evaluate the valves, as well as measure the blood flow and oxygen levels in different parts of your heart. For further information please visit [email or web address]. Please follow instruction sheet, as given.  Follow-Up: Follow up in approximately 2 weeks after your cardiac cath.  If you need a refill on your cardiac medications before your next appointment, please call your pharmacy.  Thank you for choosing [facility]!!

## 2015-07-07 DIAGNOSIS — N186 End stage renal disease: Secondary | ICD-10-CM | POA: Diagnosis not present

## 2015-07-07 DIAGNOSIS — N2581 Secondary hyperparathyroidism of renal origin: Secondary | ICD-10-CM | POA: Diagnosis not present

## 2015-07-07 DIAGNOSIS — Z992 Dependence on renal dialysis: Secondary | ICD-10-CM | POA: Diagnosis not present

## 2015-07-07 LAB — PROTIME-INR
INR: 1.02 (ref <1.50)
PROTHROMBIN TIME: 13.5 s (ref 11.6–15.2)

## 2015-07-10 DIAGNOSIS — N186 End stage renal disease: Secondary | ICD-10-CM | POA: Diagnosis not present

## 2015-07-10 DIAGNOSIS — N2581 Secondary hyperparathyroidism of renal origin: Secondary | ICD-10-CM | POA: Diagnosis not present

## 2015-07-10 DIAGNOSIS — Z992 Dependence on renal dialysis: Secondary | ICD-10-CM | POA: Diagnosis not present

## 2015-07-11 ENCOUNTER — Ambulatory Visit (HOSPITAL_COMMUNITY)
Admission: RE | Admit: 2015-07-11 | Discharge: 2015-07-12 | Disposition: A | Discharge status: Home / Self Care | Payer: Medicare Other | Source: Ambulatory Visit | Attending: Cardiovascular Disease | Admitting: Cardiovascular Disease

## 2015-07-11 ENCOUNTER — Encounter (HOSPITAL_COMMUNITY): Payer: Self-pay | Admitting: Nurse Practitioner

## 2015-07-11 ENCOUNTER — Encounter (HOSPITAL_COMMUNITY)
Admission: RE | Disposition: A | Discharge status: Home / Self Care | Payer: Self-pay | Source: Ambulatory Visit | Attending: Cardiovascular Disease

## 2015-07-11 DIAGNOSIS — I429 Cardiomyopathy, unspecified: Secondary | ICD-10-CM | POA: Insufficient documentation

## 2015-07-11 DIAGNOSIS — N186 End stage renal disease: Secondary | ICD-10-CM | POA: Diagnosis not present

## 2015-07-11 DIAGNOSIS — Z992 Dependence on renal dialysis: Secondary | ICD-10-CM | POA: Diagnosis not present

## 2015-07-11 DIAGNOSIS — I120 Hypertensive chronic kidney disease with stage 5 chronic kidney disease or end stage renal disease: Secondary | ICD-10-CM | POA: Insufficient documentation

## 2015-07-11 DIAGNOSIS — I2510 Atherosclerotic heart disease of native coronary artery without angina pectoris: Secondary | ICD-10-CM | POA: Diagnosis present

## 2015-07-11 DIAGNOSIS — I739 Peripheral vascular disease, unspecified: Secondary | ICD-10-CM | POA: Diagnosis present

## 2015-07-11 DIAGNOSIS — I428 Other cardiomyopathies: Secondary | ICD-10-CM

## 2015-07-11 DIAGNOSIS — I350 Nonrheumatic aortic (valve) stenosis: Secondary | ICD-10-CM | POA: Diagnosis not present

## 2015-07-11 DIAGNOSIS — Z7902 Long term (current) use of antithrombotics/antiplatelets: Secondary | ICD-10-CM | POA: Insufficient documentation

## 2015-07-11 DIAGNOSIS — Z79899 Other long term (current) drug therapy: Secondary | ICD-10-CM | POA: Diagnosis not present

## 2015-07-11 DIAGNOSIS — Z789 Other specified health status: Secondary | ICD-10-CM

## 2015-07-11 DIAGNOSIS — R9439 Abnormal result of other cardiovascular function study: Secondary | ICD-10-CM | POA: Diagnosis present

## 2015-07-11 HISTORY — PX: CARDIAC CATHETERIZATION: SHX172

## 2015-07-11 SURGERY — LEFT HEART CATH AND CORONARY ANGIOGRAPHY
Anesthesia: LOCAL

## 2015-07-11 MED ORDER — SODIUM CHLORIDE 0.9 % IV SOLN: 250.0000 mL | INTRAVENOUS | Status: DC | PRN

## 2015-07-11 MED ORDER — ACETAMINOPHEN 325 MG PO TABS: 650.0000 mg | ORAL_TABLET | ORAL | Status: DC | PRN

## 2015-07-11 MED ORDER — SODIUM CHLORIDE 0.9% FLUSH: 3.0000 mL | Freq: Two times a day (BID) | INTRAVENOUS | Status: DC

## 2015-07-11 MED ORDER — SODIUM CHLORIDE 0.9% FLUSH
3.0000 mL | Freq: Two times a day (BID) | INTRAVENOUS | Status: DC
  Administered 2015-07-11 – 2015-07-12 (×3): 3 mL via INTRAVENOUS

## 2015-07-11 MED ORDER — LISINOPRIL 5 MG PO TABS
5.0000 mg | ORAL_TABLET | Freq: Every day | ORAL | Status: DC
  Administered 2015-07-12: 5 mg via ORAL
  Filled 2015-07-11: qty 1

## 2015-07-11 MED ORDER — ATORVASTATIN CALCIUM 40 MG PO TABS
40.0000 mg | ORAL_TABLET | Freq: Every day | ORAL | Status: DC
  Administered 2015-07-11: 40 mg via ORAL
  Filled 2015-07-11: qty 1

## 2015-07-11 MED ORDER — FENTANYL CITRATE (PF) 100 MCG/2ML IJ SOLN
INTRAMUSCULAR | Status: DC | PRN
  Administered 2015-07-11: 50 ug via INTRAVENOUS

## 2015-07-11 MED ORDER — AMLODIPINE BESYLATE 10 MG PO TABS
10.0000 mg | ORAL_TABLET | Freq: Every day | ORAL | Status: DC
  Administered 2015-07-12: 10 mg via ORAL
  Filled 2015-07-11: qty 1

## 2015-07-11 MED ORDER — METOPROLOL SUCCINATE ER 50 MG PO TB24
50.0000 mg | ORAL_TABLET | Freq: Every day | ORAL | Status: DC
  Administered 2015-07-12: 50 mg via ORAL
  Filled 2015-07-11: qty 1

## 2015-07-11 MED ORDER — LIDOCAINE HCL (PF) 1 % IJ SOLN
INTRAMUSCULAR | Status: DC | PRN
  Administered 2015-07-11: 20 mL

## 2015-07-11 MED ORDER — SODIUM CHLORIDE 0.9% FLUSH
3.0000 mL | Freq: Two times a day (BID) | INTRAVENOUS | Status: DC
  Administered 2015-07-11 (×2): 3 mL via INTRAVENOUS

## 2015-07-11 MED ORDER — CINACALCET HCL 30 MG PO TABS
60.0000 mg | ORAL_TABLET | Freq: Every evening | ORAL | Status: DC
  Administered 2015-07-11: 60 mg via ORAL
  Filled 2015-07-11: qty 2

## 2015-07-11 MED ORDER — MIDAZOLAM HCL 2 MG/2ML IJ SOLN
INTRAMUSCULAR | Status: DC | PRN
  Administered 2015-07-11: 1 mg via INTRAVENOUS

## 2015-07-11 MED ORDER — HEPARIN (PORCINE) IN NACL 2-0.9 UNIT/ML-% IJ SOLN
INTRAMUSCULAR | Status: AC
  Filled 2015-07-11: qty 1000

## 2015-07-11 MED ORDER — SEVELAMER CARBONATE 800 MG PO TABS
4000.0000 mg | ORAL_TABLET | Freq: Three times a day (TID) | ORAL | Status: DC
  Administered 2015-07-11 – 2015-07-12 (×2): 4000 mg via ORAL
  Filled 2015-07-11 (×2): qty 5

## 2015-07-11 MED ORDER — SODIUM CHLORIDE 0.9% FLUSH: 3.0000 mL | INTRAVENOUS | Status: DC | PRN

## 2015-07-11 MED ORDER — ZOLPIDEM TARTRATE 5 MG PO TABS: 5.0000 mg | ORAL_TABLET | Freq: Every evening | ORAL | Status: DC | PRN

## 2015-07-11 MED ORDER — MIDAZOLAM HCL 2 MG/2ML IJ SOLN
INTRAMUSCULAR | Status: AC
  Filled 2015-07-11: qty 2

## 2015-07-11 MED ORDER — NITROGLYCERIN 1 MG/10 ML FOR IR/CATH LAB
INTRA_ARTERIAL | Status: AC
  Filled 2015-07-11: qty 10

## 2015-07-11 MED ORDER — LIDOCAINE HCL (PF) 1 % IJ SOLN
INTRAMUSCULAR | Status: AC
  Filled 2015-07-11: qty 30

## 2015-07-11 MED ORDER — ONDANSETRON HCL 4 MG/2ML IJ SOLN: 4.0000 mg | Freq: Four times a day (QID) | INTRAMUSCULAR | Status: DC | PRN

## 2015-07-11 MED ORDER — CLOPIDOGREL BISULFATE 75 MG PO TABS
75.0000 mg | ORAL_TABLET | Freq: Every day | ORAL | Status: DC
  Administered 2015-07-12: 75 mg via ORAL
  Filled 2015-07-11: qty 1

## 2015-07-11 MED ORDER — FENTANYL CITRATE (PF) 100 MCG/2ML IJ SOLN
INTRAMUSCULAR | Status: AC
  Filled 2015-07-11: qty 2

## 2015-07-11 MED ORDER — DIAZEPAM 5 MG PO TABS: 5.0000 mg | ORAL_TABLET | Freq: Four times a day (QID) | ORAL | Status: DC | PRN

## 2015-07-11 MED ORDER — SODIUM CHLORIDE 0.9 % IV SOLN
INTRAVENOUS | Status: DC
  Administered 2015-07-11: 07:00:00 via INTRAVENOUS

## 2015-07-11 MED ORDER — HEPARIN (PORCINE) IN NACL 2-0.9 UNIT/ML-% IJ SOLN
INTRAMUSCULAR | Status: DC | PRN
  Administered 2015-07-11: 1000 mL

## 2015-07-11 MED ORDER — IOHEXOL 350 MG/ML SOLN
INTRAVENOUS | Status: DC | PRN
  Administered 2015-07-11: 100 mL via INTRA_ARTERIAL

## 2015-07-11 MED ORDER — MECLIZINE HCL 25 MG PO TABS
25.0000 mg | ORAL_TABLET | Freq: Every day | ORAL | Status: DC
  Administered 2015-07-12: 25 mg via ORAL
  Filled 2015-07-11: qty 1

## 2015-07-11 MED ORDER — PANTOPRAZOLE SODIUM 40 MG PO TBEC
40.0000 mg | DELAYED_RELEASE_TABLET | Freq: Every day | ORAL | Status: DC
  Administered 2015-07-12: 40 mg via ORAL
  Filled 2015-07-11: qty 1

## 2015-07-11 MED ORDER — SEVELAMER CARBONATE 800 MG PO TABS: 2400.0000 mg | ORAL_TABLET | Freq: Two times a day (BID) | ORAL | Status: DC | PRN

## 2015-07-11 SURGICAL SUPPLY — 8 items
CATH INFINITI 5FR MULTPACK ANG (CATHETERS) ×1 IMPLANT
KIT HEART LEFT (KITS) ×2 IMPLANT
PACK CARDIAC CATHETERIZATION (CUSTOM PROCEDURE TRAY) ×2 IMPLANT
SHEATH PINNACLE 5F 10CM (SHEATH) ×1 IMPLANT
SYR MEDRAD MARK V 150ML (SYRINGE) ×3 IMPLANT
TUBING CIL FLEX 10 FLL-RA (TUBING) ×2 IMPLANT
VALVE MANIFOLD 3 PORT W/RA/ON (MISCELLANEOUS) ×2 IMPLANT
WIRE EMERALD 3MM-J .035X150CM (WIRE) ×1 IMPLANT

## 2015-07-11 NOTE — Progress Notes (Signed)
Site area: right groin a 5 french arterial sheath was removed  Site Prior to Removal:  Level 0  Pressure Applied For 15 MINUTES    Minutes Beginning at 0900am  Manual:   Yes.    Patient Status During Pull:  stable  Post Pull Groin Site:  Level 0  Post Pull Instructions Given:  Yes.    Post Pull Pulses Present:  Yes.    Dressing Applied:  Yes.    Comments:  VS remain stable

## 2015-07-11 NOTE — Progress Notes (Signed)
Spoke with Mariann Laster at [facility] in [place].  Pt is scheduled for diaylsis in the morning at 0656.  They said they could do his diaylsis at 12:00.  Notified Trish that they need to d/c patient early am so he can make it to his diaylsis by 12:00.

## 2015-07-11 NOTE — Discharge Instructions (Signed)
Coronary Angiogram °A coronary angiogram, also called coronary angiography, is an X-ray procedure used to look at the arteries in the heart. In this procedure, a dye (contrast dye) is injected through a long, hollow tube (catheter). The catheter is about the size of a piece of cooked spaghetti and is inserted through your groin, wrist, or arm. The dye is injected into each artery, and X-rays are then taken to show if there is a blockage in the arteries of your heart. °LET YOUR HEALTH CARE PROVIDER KNOW ABOUT: °· Any allergies you have, including allergies to shellfish or contrast dye.   °· All medicines you are taking, including vitamins, herbs, eye drops, creams, and over-the-counter medicines.   °· Previous problems you or members of your family have had with the use of anesthetics.   °· Any blood disorders you have.   °· Previous surgeries you have had. °· History of kidney problems or failure.   °· Other medical conditions you have. °RISKS AND COMPLICATIONS  °Generally, a coronary angiogram is a safe procedure. However, problems can occur and include: °· Allergic reaction to the dye. °· Bleeding from the access site or other locations. °· Kidney injury, especially in people with impaired kidney function.  °· Stroke (rare). °· Heart attack (rare). °BEFORE THE PROCEDURE  °· Do not eat or drink anything after midnight the night before the procedure or as directed by your health care provider.   °· Ask your health care provider about changing or stopping your regular medicines. This is especially important if you are taking diabetes medicines or blood thinners. °PROCEDURE °· You may be given a medicine to help you relax (sedative) before the procedure. This medicine is given through an intravenous (IV) access tube that is inserted into one of your veins.   °· The area where the catheter will be inserted will be washed and shaved. This is usually done in the groin but may be done in the fold of your arm (near your  elbow) or in the wrist.    °· A medicine will be given to numb the area where the catheter will be inserted (local anesthetic).   °· The health care provider will insert the catheter into an artery. The catheter will be guided by using a special type of X-ray (fluoroscopy) of the blood vessel being examined.   °· A special dye will then be injected into the catheter, and X-rays will be taken. The dye will help to show where any narrowing or blockages are located in the heart arteries.   °AFTER THE PROCEDURE  °· If the procedure is done through the leg, you will be kept in bed lying flat for several hours. You will be instructed to not bend or cross your legs. °· The insertion site will be checked frequently.   °· The pulse in your feet or wrist will be checked frequently.   °· Additional blood tests, X-rays, and an electrocardiogram may be done.   °  °This information is not intended to replace advice given to you by your health care provider. Make sure you discuss any questions you have with your health care provider. °  °Document Released: 10/20/2002 Document Revised: 05/06/2014 Document Reviewed: 09/07/2012 °Elsevier Interactive Patient Education ©2016 Elsevier Inc. ° °

## 2015-07-11 NOTE — Progress Notes (Signed)
Pt has CAD, LVD, and PVD. Will add ACE and statin Rx. Early discharge tomorrow for HD in [place] 12 noon.   Kerin Ransom PA-C 07/11/2015 4:44 PM

## 2015-07-12 DIAGNOSIS — I350 Nonrheumatic aortic (valve) stenosis: Secondary | ICD-10-CM

## 2015-07-12 DIAGNOSIS — I429 Cardiomyopathy, unspecified: Secondary | ICD-10-CM | POA: Diagnosis not present

## 2015-07-12 DIAGNOSIS — I120 Hypertensive chronic kidney disease with stage 5 chronic kidney disease or end stage renal disease: Secondary | ICD-10-CM | POA: Diagnosis not present

## 2015-07-12 DIAGNOSIS — N2581 Secondary hyperparathyroidism of renal origin: Secondary | ICD-10-CM | POA: Diagnosis not present

## 2015-07-12 DIAGNOSIS — I2510 Atherosclerotic heart disease of native coronary artery without angina pectoris: Secondary | ICD-10-CM | POA: Diagnosis not present

## 2015-07-12 DIAGNOSIS — Z992 Dependence on renal dialysis: Secondary | ICD-10-CM | POA: Diagnosis not present

## 2015-07-12 DIAGNOSIS — N186 End stage renal disease: Secondary | ICD-10-CM | POA: Diagnosis not present

## 2015-07-12 MED ORDER — ATORVASTATIN CALCIUM 40 MG PO TABS
40.0000 mg | ORAL_TABLET | Freq: Every day | ORAL | Status: DC
Start: 2015-07-12 — End: 2015-12-14

## 2015-07-12 NOTE — Progress Notes (Signed)
    Subjective:  No complaints this am  Objective:  Vital Signs in the last 24 hours: Temp:  [97.6 F (36.4 C)-98.4 F (36.9 C)] 98.4 F (36.9 C) (03/15 0533) Pulse Rate:  [80-97] 87 (03/15 0533) Resp:  [15-19] 16 (03/15 0533) BP: (101-163)/(32-92) 131/78 mmHg (03/15 0533) SpO2:  [97 %-100 %] 100 % (03/15 0533) Weight:  [179 lb 14.4 oz (81.602 kg)] 179 lb 14.4 oz (81.602 kg) (03/15 0533)  Intake/Output from previous day:  Intake/Output Summary (Last 24 hours) at 07/12/15 0930 Last data filed at 07/12/15 0900  Gross per 24 hour  Intake    840 ml  Output      0 ml  Net    840 ml    Physical Exam: General appearance: alert, cooperative and no distress Lungs: clear to auscultation bilaterally Heart: regular rate and rhythm and 2/6 AS murmur   Rate: 88  Rhythm: normal sinus rhythm  Lab Results: No results for input(s): WBC, HGB, PLT in the last 72 hours. No results for input(s): NA, K, CL, CO2, GLUCOSE, BUN, CREATININE in the last 72 hours. No results for input(s): TROPONINI in the last 72 hours.  Invalid input(s): CK, MB No results for input(s): INR in the last 72 hours.  Scheduled Meds: . amLODipine  10 mg Oral Daily  . atorvastatin  40 mg Oral q1800  . cinacalcet  60 mg Oral QPM  . clopidogrel  75 mg Oral Daily  . lisinopril  5 mg Oral Daily  . meclizine  25 mg Oral Daily  . metoprolol succinate  50 mg Oral Daily  . pantoprazole  40 mg Oral Daily  . sevelamer carbonate  4,000 mg Oral TID WC  . sodium chloride flush  3 mL Intravenous Q12H  . sodium chloride flush  3 mL Intravenous Q12H   Continuous Infusions:  PRN Meds:.sodium chloride, sodium chloride, acetaminophen, diazepam, ondansetron (ZOFRAN) IV, sevelamer carbonate, sodium chloride flush, sodium chloride flush, zolpidem   Imaging: No results found.  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 Will add Lipitor 40 mg. Pt  Says he had GI bleeding on ASA in the past.   Kerin Ransom PA-C 07/12/2015, 9:30 AM [phone]  Attending Note:   The patient was seen and examined.  Agree with assessment and plan as noted above.  Changes made to the above note as needed.  Pt is doing well. Was seen on 3/15 Stable for DC .   Thayer Headings, Brooke Bonito., MD, [facility] 07/13/2015, 4:58 PM [address],  [place] Pager [phone]

## 2015-07-12 NOTE — Discharge Summary (Signed)
Discharge Summary    Patient ID: [name],  MRN: [id number], DOB/AGE: Feb 23, 1941 75 y.o.  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 Aspirin Other (See Comments)    Causes internal bleeding     Diagnostic Studies/Procedures    Coronary angiogram 07/11/15 _____________   History of Present Illness     75 y.o. AA male with end-stage renal disease, AS, moderate LVD- sent to Dr Marlou Porch for pre op clearance (AVF repair). OP Myoview as [facility] Course          75 y.o. AA male with end-stage renal disease, left ventricular hypertrophy, echocardiogram in Feb 2017 showing moderate aortic stenosis with a EF of 45%, PVD, hypertension, and a history of GI bleeding (on Plavix -no  ASA) seen by Dr Marlou Porch as an OP in February for pre op risk stratification prior to AV fistula placement.  Myoview 2/06/07/15 was abnormal and the pt was admitted for diagnostic cath 07/11/15. This revealed 45% proximal LAD. Overall EF at cath 35%. Dr Claiborne Billings mentioned that the pt had at least mild aortic stenosis with LV to AO gradient ranging from 10 -18 mm Hg  with reduced LV function; LVEDP 13 mm Hg.  He suggested medical therapy for his cardiomyopathy and mild CAD.He also suggested a dobutamine stress echo if there was concern for low gradient AS with reduced LV function. The pt was kept overnight and is for discharge 07/12/15. He will f/u with Dr Marlou Porch as an OP. Lipitor 40 mg added. Consider adding an ACE as an OP.   _____________  Discharge Vitals Blood pressure 130/70, pulse 87, temperature 98.4 F (36.9 C), temperature source  Oral, resp. rate 16, height 5\' 5"  (1.651 m), weight 179 lb 14.4 oz (81.602 kg), SpO2 100 %.  Filed Weights   07/11/15 0559 07/12/15 0533  Weight: 184 lb (83.462 kg) 179 lb 14.4 oz (81.602 kg)    Labs & Radiologic Studies     CBC No results for input(s): WBC, NEUTROABS, HGB, HCT, MCV, PLT in the last 72 hours. Basic Metabolic Panel No results for input(s): NA, K, CL, CO2, GLUCOSE, BUN, CREATININE, CALCIUM, MG, PHOS in the last 72 hours. Liver Function Tests No results for input(s): AST, ALT, ALKPHOS, BILITOT, PROT, ALBUMIN in the last 72 hours. No results for input(s): LIPASE, AMYLASE in the last 72 hours. Cardiac Enzymes No results for input(s): CKTOTAL, CKMB, CKMBINDEX, TROPONINI in the last 72 hours. BNP Invalid input(s): POCBNP D-Dimer No results for input(s): DDIMER in the last 72 hours. Hemoglobin A1C No results for input(s): HGBA1C in the last 72 hours. Fasting Lipid Panel No results for input(s): CHOL, HDL, LDLCALC, TRIG, CHOLHDL, LDLDIRECT in the last 72 hours. Thyroid Function Tests No results for input(s): TSH, T4TOTAL, T3FREE, THYROIDAB in the last 72 hours.  Invalid input(s): FREET3  No results found.  Disposition   Pt is being discharged home today in good condition.  Follow-up Plans & Appointments    Follow-up Information    Follow up with Candee Furbish, MD.   Specialty:  Cardiology   Why:  office will contact you   Contact information:   [address] [place] [address] [phone]        Discharge Medications   Current Discharge Medication List    START taking these medications   Details  acetaminophen (TYLENOL) 325 MG tablet Take 2 tablets (650 mg total) by mouth every 4 (four) hours as needed for headache or mild pain.    atorvastatin (LIPITOR) 40 MG tablet Take 1 tablet (40 mg total) by mouth daily at 6 PM. Qty: 90 tablet, Refills: 3      CONTINUE these medications which have NOT CHANGED   Details  ALPRAZolam  (XANAX) 0.5 MG tablet Take 0.5 mg by mouth at bedtime as needed for anxiety.     amLODipine (NORVASC) 10 MG tablet Take 10 mg by mouth daily.    b complex-vitamin c-folic acid (NEPHRO-VITE) 0.8 MG TABS Take 0.8 mg by mouth as directed. Takes on Tuesdays, Thursdays, Saturdays, and Sundays. Does not take on Mondays, Wednesdays, and Fridays due to Dialysis treatments.    cinacalcet (SENSIPAR) 60 MG tablet Take 60 mg by mouth every evening. With evening meal    clopidogrel (PLAVIX) 75 MG tablet Take 75 mg by mouth daily. Reported on 05/30/2015    meclizine (ANTIVERT) 25 MG tablet Take 25 mg by mouth daily.    metoprolol succinate (TOPROL-XL) 50 MG 24 hr tablet Take 50 mg by mouth daily. Reported on 05/30/2015    omeprazole (PRILOSEC) 20 MG capsule Take 20 mg by mouth daily. Reported on 05/30/2015    promethazine (PHENERGAN) 25 MG tablet Take 25 mg by mouth every 6 (six) hours as needed for nausea. Reported on 05/30/2015    sevelamer (RENVELA) 800 MG tablet Take 2,400-4,000 mg by mouth 3 (three) times daily with meals. Patient takes 5 tablets with meals and 3 tablets with snacks         Aspirin prescribed at discharge?  No: H/O GI bleed High Intensity Statin Prescribed? (Lipitor 40-80mg  or Crestor 20-40mg ): Yes Beta Blocker Prescribed? Yes For EF 45% or less, Was ACEI/ARB Prescribed? No: HD pt- may add as an OP ADP Receptor Inhibitor Prescribed? (i.e. Plavix etc.-Includes Medically Managed Patients): Yes For EF <40%, Aldosterone Inhibitor Prescribed? No: Dialysis pt Was EF assessed during THIS hospitalization? Yes Was Cardiac Rehab II ordered? (Included Medically managed Patients): No: NA   Outstanding Labs/Studies    Duration of Discharge Encounter   Greater than 30 minutes including physician time.  Angelena Form K PA 07/12/2015, 9:33 AM   Attending Note:   The patient was seen and examined.  Agree with assessment and plan as noted above.  Changes made to the above note as  needed.  Pt  Is stable for DC   Thayer Headings, Brooke Bonito., MD, [facility] 07/13/2015, 5:00 PM [address],  [place] Pager [phone]

## 2015-07-12 NOTE — Discharge Summary (Signed)
Pt got discharged to home, discharge instructions provided and patient showed understanding to it, IV taken out,Telemonitor DC,pt left unit in wheelchair with all of the belongings accompanied with a family member (friend)

## 2015-07-13 ENCOUNTER — Telehealth: Payer: Self-pay | Admitting: Cardiology

## 2015-07-13 NOTE — Telephone Encounter (Signed)
PT CALLED  AND  STATED NEEDS  TO  HAVE  AND  OUTPATIENT  PROCEDURE  DONE ON RIGHT  HAND    AND  WANTS TO KNOW   IF MAY PROCEED . PT  AWARE   THAT  SURGEONS OFFICE  NEEDS  TO  NOTIFY  DR Marlou Porch IF   PT  IS   CLEAR  FROM  CARDIAC  PERSPECTIVE .WILL AWAIT  FOR  A  CLEARANCE FORM  TO REVIEW .Adonis Housekeeper

## 2015-07-13 NOTE — Telephone Encounter (Signed)
New Message  Pt requested to speak w/ RN concernin follow up on recent test he did w/ ortho. Please call back and discuss.

## 2015-07-14 DIAGNOSIS — N186 End stage renal disease: Secondary | ICD-10-CM | POA: Diagnosis not present

## 2015-07-14 DIAGNOSIS — N2581 Secondary hyperparathyroidism of renal origin: Secondary | ICD-10-CM | POA: Diagnosis not present

## 2015-07-14 DIAGNOSIS — Z992 Dependence on renal dialysis: Secondary | ICD-10-CM | POA: Diagnosis not present

## 2015-07-17 DIAGNOSIS — N186 End stage renal disease: Secondary | ICD-10-CM | POA: Diagnosis not present

## 2015-07-17 DIAGNOSIS — N2581 Secondary hyperparathyroidism of renal origin: Secondary | ICD-10-CM | POA: Diagnosis not present

## 2015-07-17 DIAGNOSIS — Z992 Dependence on renal dialysis: Secondary | ICD-10-CM | POA: Diagnosis not present

## 2015-07-17 NOTE — Telephone Encounter (Signed)
Pt had cardiac cath 3/14 by Dr Claiborne Billings for cardiac clearance after an abnormal stress test.  Will forward to Dr Marlou Porch for review and give recommendations r/t surgical clearance.

## 2015-07-18 DIAGNOSIS — Z992 Dependence on renal dialysis: Secondary | ICD-10-CM | POA: Diagnosis not present

## 2015-07-18 DIAGNOSIS — I871 Compression of vein: Secondary | ICD-10-CM | POA: Diagnosis not present

## 2015-07-18 DIAGNOSIS — T82858D Stenosis of vascular prosthetic devices, implants and grafts, subsequent encounter: Secondary | ICD-10-CM | POA: Diagnosis not present

## 2015-07-18 DIAGNOSIS — N186 End stage renal disease: Secondary | ICD-10-CM | POA: Diagnosis not present

## 2015-07-19 DIAGNOSIS — Z992 Dependence on renal dialysis: Secondary | ICD-10-CM | POA: Diagnosis not present

## 2015-07-19 DIAGNOSIS — N2581 Secondary hyperparathyroidism of renal origin: Secondary | ICD-10-CM | POA: Diagnosis not present

## 2015-07-19 DIAGNOSIS — N186 End stage renal disease: Secondary | ICD-10-CM | POA: Diagnosis not present

## 2015-07-20 ENCOUNTER — Encounter: Payer: Self-pay | Admitting: Cardiology

## 2015-07-20 NOTE — Telephone Encounter (Signed)
Pt aware he has been cleared to have surgery for his fistula placement.  Forwarded Dr Marlou Porch' surgical clearance to Dr Kellie Simmering for his knowledge.

## 2015-07-21 DIAGNOSIS — N2581 Secondary hyperparathyroidism of renal origin: Secondary | ICD-10-CM | POA: Diagnosis not present

## 2015-07-21 DIAGNOSIS — Z992 Dependence on renal dialysis: Secondary | ICD-10-CM | POA: Diagnosis not present

## 2015-07-21 DIAGNOSIS — N186 End stage renal disease: Secondary | ICD-10-CM | POA: Diagnosis not present

## 2015-07-24 DIAGNOSIS — Z992 Dependence on renal dialysis: Secondary | ICD-10-CM | POA: Diagnosis not present

## 2015-07-24 DIAGNOSIS — N186 End stage renal disease: Secondary | ICD-10-CM | POA: Diagnosis not present

## 2015-07-24 DIAGNOSIS — N2581 Secondary hyperparathyroidism of renal origin: Secondary | ICD-10-CM | POA: Diagnosis not present

## 2015-07-26 DIAGNOSIS — Z992 Dependence on renal dialysis: Secondary | ICD-10-CM | POA: Diagnosis not present

## 2015-07-26 DIAGNOSIS — N2581 Secondary hyperparathyroidism of renal origin: Secondary | ICD-10-CM | POA: Diagnosis not present

## 2015-07-26 DIAGNOSIS — N186 End stage renal disease: Secondary | ICD-10-CM | POA: Diagnosis not present

## 2015-07-28 DIAGNOSIS — N2581 Secondary hyperparathyroidism of renal origin: Secondary | ICD-10-CM | POA: Diagnosis not present

## 2015-07-28 DIAGNOSIS — Z992 Dependence on renal dialysis: Secondary | ICD-10-CM | POA: Diagnosis not present

## 2015-07-28 DIAGNOSIS — N186 End stage renal disease: Secondary | ICD-10-CM | POA: Diagnosis not present

## 2015-07-31 DIAGNOSIS — Z992 Dependence on renal dialysis: Secondary | ICD-10-CM | POA: Diagnosis not present

## 2015-07-31 DIAGNOSIS — N2581 Secondary hyperparathyroidism of renal origin: Secondary | ICD-10-CM | POA: Diagnosis not present

## 2015-07-31 DIAGNOSIS — N186 End stage renal disease: Secondary | ICD-10-CM | POA: Diagnosis not present

## 2015-08-01 DIAGNOSIS — L97512 Non-pressure chronic ulcer of other part of right foot with fat layer exposed: Secondary | ICD-10-CM | POA: Diagnosis not present

## 2015-08-01 DIAGNOSIS — E1142 Type 2 diabetes mellitus with diabetic polyneuropathy: Secondary | ICD-10-CM | POA: Diagnosis not present

## 2015-08-01 DIAGNOSIS — B351 Tinea unguium: Secondary | ICD-10-CM | POA: Diagnosis not present

## 2015-08-02 DIAGNOSIS — N2581 Secondary hyperparathyroidism of renal origin: Secondary | ICD-10-CM | POA: Diagnosis not present

## 2015-08-02 DIAGNOSIS — Z992 Dependence on renal dialysis: Secondary | ICD-10-CM | POA: Diagnosis not present

## 2015-08-02 DIAGNOSIS — N186 End stage renal disease: Secondary | ICD-10-CM | POA: Diagnosis not present

## 2015-08-04 DIAGNOSIS — Z992 Dependence on renal dialysis: Secondary | ICD-10-CM | POA: Diagnosis not present

## 2015-08-04 DIAGNOSIS — N186 End stage renal disease: Secondary | ICD-10-CM | POA: Diagnosis not present

## 2015-08-04 DIAGNOSIS — N2581 Secondary hyperparathyroidism of renal origin: Secondary | ICD-10-CM | POA: Diagnosis not present

## 2015-08-07 DIAGNOSIS — N186 End stage renal disease: Secondary | ICD-10-CM | POA: Diagnosis not present

## 2015-08-07 DIAGNOSIS — Z992 Dependence on renal dialysis: Secondary | ICD-10-CM | POA: Diagnosis not present

## 2015-08-07 DIAGNOSIS — N2581 Secondary hyperparathyroidism of renal origin: Secondary | ICD-10-CM | POA: Diagnosis not present

## 2015-08-09 DIAGNOSIS — N186 End stage renal disease: Secondary | ICD-10-CM | POA: Diagnosis not present

## 2015-08-09 DIAGNOSIS — N2581 Secondary hyperparathyroidism of renal origin: Secondary | ICD-10-CM | POA: Diagnosis not present

## 2015-08-09 DIAGNOSIS — Z992 Dependence on renal dialysis: Secondary | ICD-10-CM | POA: Diagnosis not present

## 2015-08-11 DIAGNOSIS — Z992 Dependence on renal dialysis: Secondary | ICD-10-CM | POA: Diagnosis not present

## 2015-08-11 DIAGNOSIS — N186 End stage renal disease: Secondary | ICD-10-CM | POA: Diagnosis not present

## 2015-08-11 DIAGNOSIS — N2581 Secondary hyperparathyroidism of renal origin: Secondary | ICD-10-CM | POA: Diagnosis not present

## 2015-08-14 DIAGNOSIS — N186 End stage renal disease: Secondary | ICD-10-CM | POA: Diagnosis not present

## 2015-08-14 DIAGNOSIS — N2581 Secondary hyperparathyroidism of renal origin: Secondary | ICD-10-CM | POA: Diagnosis not present

## 2015-08-14 DIAGNOSIS — Z992 Dependence on renal dialysis: Secondary | ICD-10-CM | POA: Diagnosis not present

## 2015-08-15 ENCOUNTER — Encounter: Payer: Self-pay | Admitting: Cardiology

## 2015-08-15 ENCOUNTER — Ambulatory Visit (INDEPENDENT_AMBULATORY_CARE_PROVIDER_SITE_OTHER): Payer: Medicare Other | Admitting: Cardiology

## 2015-08-15 VITALS — BP 120/70 | HR 84 | Ht 65.0 in | Wt 179.0 lb

## 2015-08-15 DIAGNOSIS — L97512 Non-pressure chronic ulcer of other part of right foot with fat layer exposed: Secondary | ICD-10-CM | POA: Diagnosis not present

## 2015-08-15 DIAGNOSIS — I428 Other cardiomyopathies: Secondary | ICD-10-CM

## 2015-08-15 DIAGNOSIS — I350 Nonrheumatic aortic (valve) stenosis: Secondary | ICD-10-CM | POA: Diagnosis not present

## 2015-08-15 DIAGNOSIS — E1142 Type 2 diabetes mellitus with diabetic polyneuropathy: Secondary | ICD-10-CM | POA: Diagnosis not present

## 2015-08-15 DIAGNOSIS — N186 End stage renal disease: Secondary | ICD-10-CM | POA: Diagnosis not present

## 2015-08-15 DIAGNOSIS — Z01810 Encounter for preprocedural cardiovascular examination: Secondary | ICD-10-CM | POA: Diagnosis not present

## 2015-08-15 DIAGNOSIS — I429 Cardiomyopathy, unspecified: Secondary | ICD-10-CM

## 2015-08-15 DIAGNOSIS — Z992 Dependence on renal dialysis: Secondary | ICD-10-CM

## 2015-08-15 DIAGNOSIS — I739 Peripheral vascular disease, unspecified: Secondary | ICD-10-CM | POA: Diagnosis not present

## 2015-08-15 DIAGNOSIS — L97521 Non-pressure chronic ulcer of other part of left foot limited to breakdown of skin: Secondary | ICD-10-CM | POA: Diagnosis not present

## 2015-08-15 NOTE — Patient Instructions (Signed)

## 2015-08-15 NOTE — Progress Notes (Signed)
Cardiology Office Note    Date:  08/15/2015   ID:  [name], DOB 1940/11/12, MRN [id number]  PCP:  [name], MD  Cardiologist:   Candee Furbish, MD     History of Present Illness:  [name] is a 75 y.o. 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 Quit tob 10 years ago.    overall he is not feeling any chest pain, no shortness of breath, no syncope. Ambulates without difficulty. Minor left leg dragging sensation from his prior stroke.    Past Medical History  Diagnosis Date  . Hypertension   . CVA (cerebral infarction)   . Gastric ulcer 2004    treated for h.pylori see PSH  . LVH (left ventricular hypertrophy)   . Stroke ([place])   . ESRD (end stage renal disease) on dialysis ([facility])   . Dialysis patient [facility])     Past Surgical History  Procedure Laterality Date  . Cholecystectomy    . Shoulder surgery    . Colonoscopy  2004    Dr. Irving Shows, left sided diverticula and cecal polyp, path unknown  . Esophagogastroduodenoscopy  11/2002    Dr. Gala Romney, erosive reflux esophagitis, multiple gastric ulcer and antral/bulbar erosions. Serologies positive for H.Pylori and was treated  . Esophagogastroduodenoscopy  11/20014    Dr. Gala Romney, small hh only, ulcers healed  . Esophagogastroduodenoscopy  09/21/2011    Dr Trevor Iha [facility], antral erosions, ?early GAVE  . Colonoscopy  10/29/2011    Procedure: COLONOSCOPY;  Surgeon: Daneil Dolin, MD;  Location: [facility];  Service: Endoscopy;  Laterality: N/A;  10:15  . Arteriovenous graft placement Right right arm  . Abdominal aortagram N/A 01/24/2012    Procedure: ABDOMINAL Maxcine Ham;  Surgeon: Elam Dutch, MD;  Location: [facility] CATH LAB;  Service:  Cardiovascular;  Laterality: N/A;  . Carpel tunnel Left Dec. 22, 2016  . Cardiac catheterization N/A 07/11/2015    Procedure: Left Heart Cath and Coronary Angiography;  Surgeon: Troy Sine, MD;  Location: [place] CV LAB;  Service: Cardiovascular;  Laterality: N/A;    Outpatient Prescriptions Prior to Visit  Medication Sig Dispense Refill  . acetaminophen (TYLENOL) 325 MG tablet Take 2 tablets (650 mg total) by mouth every 4 (four) hours as needed for headache or mild pain.    Marland Kitchen ALPRAZolam (XANAX) 0.5 MG tablet Take 0.5 mg by mouth at bedtime as needed for anxiety.     Marland Kitchen amLODipine (NORVASC) 10 MG tablet Take 10 mg by mouth daily.    Marland Kitchen atorvastatin (LIPITOR) 40 MG tablet Take 1 tablet (40 mg total) by mouth daily at 6 PM. 90 tablet 3  . b complex-vitamin c-folic acid (NEPHRO-VITE) 0.8 MG TABS Take 0.8 mg by mouth as directed. Takes on Tuesdays, Thursdays, Saturdays, and Sundays. Does not take on Mondays, Wednesdays, and Fridays due to Dialysis treatments.    . cinacalcet (SENSIPAR) 60 MG tablet Take 60 mg by mouth every evening. With evening meal    . clopidogrel (PLAVIX) 75 MG tablet Take 75 mg by mouth daily. Reported on 05/30/2015    . meclizine (ANTIVERT) 25 MG tablet Take 25 mg by mouth daily.    . metoprolol succinate (TOPROL-XL) 50 MG 24 hr tablet Take 50 mg by mouth  daily. Reported on 05/30/2015    . omeprazole (PRILOSEC) 20 MG capsule Take 20 mg by mouth daily. Reported on 05/30/2015    . promethazine (PHENERGAN) 25 MG tablet Take 25 mg by mouth every 6 (six) hours as needed for nausea. Reported on 05/30/2015    . sevelamer (RENVELA) 800 MG tablet Take 2,400-4,000 mg by mouth 3 (three) times daily with meals. Patient takes 5 tablets with meals and 3 tablets with snacks     No facility-administered medications prior to visit.     Allergies:   Aspirin   Social History   Social History  . Marital Status: Single    Spouse Name: N/A  . Number of Children: 2  . Years of  Education: N/A   Occupational History  . retired, [employer]    Social History Main Topics  . Smoking status: Former Smoker    Quit date: 04/29/2004  . Smokeless tobacco: Former Systems developer    Types: [place] date: 01/16/1987     Comment: quit 2006  . Alcohol Use: No  . Drug Use: No  . Sexual Activity: Not Asked   Other Topics Concern  . None   Social History Narrative   Lives alone   Daughter 20-min away   Caffeine use: 32oz soda per day     Family History:  The patient's family history includes Hypertension in his mother. There is no history of Colon cancer or Liver disease. his mother died when he was very young at age 46. His father died in his 77s or 29s. He does not believe that there was any heart disease.   ROS:   Please see the history of present illness.    ROS denies any fevers, chills, orthopnea, PND, bleeding, syncope. All other systems reviewed and are negative.   PHYSICAL EXAM:   VS:  BP 120/70 mmHg  Pulse 84  Ht 5\' 5"  (1.651 m)  Wt 179 lb (81.194 kg)  BMI 29.79 kg/m2   GEN: Well nourished, well developed, in no acute distress HEENT: normal Neck: no JVD, carotid bruits, or masses Cardiac: RRR; 3/6 SEM, no rubs, or gallops,no edema  dialysis catheter left chest noted, prior right arm fistula noted  Respiratory:  clear to auscultation bilaterally, normal work of breathing GI: soft, nontender, nondistended, + BS MS: no deformity or atrophy Skin: warm and dry, no rash Neuro:  Alert and Oriented x 3, Strength and sensation are intact Psych: euthymic mood, full affect  Wt Readings from Last 3 Encounters:  08/15/15 179 lb (81.194 kg)  07/12/15 179 lb 14.4 oz (81.602 kg)  07/06/15 180 lb 6.4 oz (81.829 kg)      Studies/Labs Reviewed:   EKG:  EKG is not ordered today.   EKG from 03/06/15 shows sinus rhythm, right bundle branch block with nonspecific ST-T wave changes possible lateral ischemia   Recent Labs: 07/06/2015: BUN 32*; Creat 10.80*; Hemoglobin  14.2; Platelets 330; Potassium 4.0; Sodium 135   Lipid Panel    Component Value Date/Time   CHOL  12/01/2009 0145    140        ATP III CLASSIFICATION:  <200     mg/dL   Desirable  200-239  mg/dL   Borderline High  >=240    mg/dL   High          TRIG 122 12/01/2009 0145   HDL 31* 12/01/2009 0145   CHOLHDL 4.5 12/01/2009 0145   VLDL 24 12/01/2009 0145  [place]  12/01/2009 0145    85        Total Cholesterol/HDL:CHD Risk Coronary Heart Disease Risk Table                     Men   Women  1/2 Average Risk   3.4   3.3  Average Risk       5.0   4.4  2 X Average Risk   9.6   7.1  3 X Average Risk  23.4   11.0        Use the calculated Patient Ratio above and the CHD Risk Table to determine the patient's CHD Risk.        ATP III CLASSIFICATION (LDL):  <100     mg/dL   Optimal  100-129  mg/dL   Near or Above                    Optimal  130-159  mg/dL   Borderline  160-189  mg/dL   High  >190     mg/dL   Very High    Additional studies/ records that were reviewed today include:  ECHO 12/01/09: - Left ventricle: The cavity size was normal. Moderate LVH with  disproportionate upper septal thickening, Systolic function was  moderately to severely reduced. The estimated ejection fraction  was in the range of 30% to 35%. Moderate diffuse hypokinesis. - Aortic valve: Mildly calcified annulus. Moderately thickened,  moderately calcified leaflets. Cusp separation was mildly reduced.  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 Valve area: 0.93cm^2(VTI). Valve area:  1.08cm^2 (Vmax). - Mitral valve: Moderately calcified annulus. - Left atrium: The atrium was mildly to moderately dilated. - Right ventricle: The cavity size was normal. Wall thickness was  increased.  ECHO 06/27/15: Personally viewed echocardiogram, there does appear to be inferolateral wall motion abnormality with ejection fraction in the 45% range Moderate aortic stenosis noted (mean 49mmHg).  Cardiac Cath  07/11/15:  Prox LAD lesion, 45% stenosed.  There is moderate to severe left ventricular systolic dysfunction.  Nonischemic cardiomyopathy with global hypokinesis with more pronounced inferior hypocontractility and overall ejection fraction at 35%.  Mild coronary calcification with 40-50% eccentric proximal LAD stenosis just prior to a septal perforating artery; mild calcification of the proximal left circumflex coronary artery; and large dominant RCA without significant obstructive stenoses.  Probably at least mild aortic stenosis with LV to AO gradient ranging from 10 -18 mm Hg in this patient with reduced LV function; LVEDP 13 mm Hg.   RECOMMENDATION: Medical therapy for his cardiomyopathy and mild CAD. Consider a dobutamine stress echo if concern for low gradient AS with reduced LV function.  ASSESSMENT:    1. End stage renal disease ([place])   2. Pre-operative cardiovascular examination   3. ESRD on dialysis ([place])   4. Peripheral vascular disease, unspecified ([place])   5. Aortic stenosis   6. Nonischemic cardiomyopathy ([facility])      PLAN:  In order of problems listed above:   Preoperative risk stratification prior to AV fistula placement - Cath depicted non ischemic cardiomyopathy (only 45% LAD lesion).  Prior EF on echocardiogram in 2011 was 30-35% (current cath ~35%, ECHO 45%). Moderate aortic valve stenosis. NYHA 1-2. Stable. May proceed with AV fistula placement with moderate cardiac risk.   Cardiomyopathy-he is currently on Toprol. He is not on ACE inhibitor because of end-stage renal disease.  Possibly hypertensive cardiomyopathy.   Moderate aortic stenosis - murmur on exam. No significant progression over  the past 6 years. No symptoms.   End-stage renal disease on hemodialysis-currently being dialyzed through catheter.  History of stroke in 2000. Plavix.  Peripheral vascular disease-prior atherectomy of anterior tibial artery, right 2012    Medication Adjustments/Labs  and Tests Ordered: Current medicines are reviewed at length with the patient today.  Concerns regarding medicines are outlined above.  Medication changes, Labs and Tests ordered today are listed in the Patient Instructions below. Patient Instructions  Medication Instructions:  The current medical regimen is effective;  continue present plan and medications.  Follow-Up: Follow up in 6 months with Dr. Marlou Porch.  You will receive a letter in the mail 2 months before you are due.  Please call us when you receive this letter to schedule your follow up appointment.  If you need a refill on your cardiac medications before your next appointment, please call your pharmacy.  Thank you for choosing [facility]!!            Signed, Candee Furbish, MD  08/15/2015 9:36 AM    [facility] [place], [place], [place]  [address] Phone: [phone]; Fax: [phone]

## 2015-08-16 DIAGNOSIS — N186 End stage renal disease: Secondary | ICD-10-CM | POA: Diagnosis not present

## 2015-08-16 DIAGNOSIS — Z992 Dependence on renal dialysis: Secondary | ICD-10-CM | POA: Diagnosis not present

## 2015-08-16 DIAGNOSIS — N2581 Secondary hyperparathyroidism of renal origin: Secondary | ICD-10-CM | POA: Diagnosis not present

## 2015-08-18 ENCOUNTER — Other Ambulatory Visit: Payer: Self-pay

## 2015-08-18 DIAGNOSIS — N2581 Secondary hyperparathyroidism of renal origin: Secondary | ICD-10-CM | POA: Diagnosis not present

## 2015-08-18 DIAGNOSIS — T8249XA Other complication of vascular dialysis catheter, initial encounter: Secondary | ICD-10-CM | POA: Diagnosis not present

## 2015-08-18 DIAGNOSIS — Z992 Dependence on renal dialysis: Secondary | ICD-10-CM | POA: Diagnosis not present

## 2015-08-18 DIAGNOSIS — I871 Compression of vein: Secondary | ICD-10-CM | POA: Diagnosis not present

## 2015-08-18 DIAGNOSIS — N186 End stage renal disease: Secondary | ICD-10-CM | POA: Diagnosis not present

## 2015-08-21 DIAGNOSIS — Z992 Dependence on renal dialysis: Secondary | ICD-10-CM | POA: Diagnosis not present

## 2015-08-21 DIAGNOSIS — N186 End stage renal disease: Secondary | ICD-10-CM | POA: Diagnosis not present

## 2015-08-21 DIAGNOSIS — N2581 Secondary hyperparathyroidism of renal origin: Secondary | ICD-10-CM | POA: Diagnosis not present

## 2015-08-23 DIAGNOSIS — Z992 Dependence on renal dialysis: Secondary | ICD-10-CM | POA: Diagnosis not present

## 2015-08-23 DIAGNOSIS — N2581 Secondary hyperparathyroidism of renal origin: Secondary | ICD-10-CM | POA: Diagnosis not present

## 2015-08-23 DIAGNOSIS — N186 End stage renal disease: Secondary | ICD-10-CM | POA: Diagnosis not present

## 2015-08-24 DIAGNOSIS — N186 End stage renal disease: Secondary | ICD-10-CM | POA: Diagnosis not present

## 2015-08-24 DIAGNOSIS — I5022 Chronic systolic (congestive) heart failure: Secondary | ICD-10-CM | POA: Diagnosis not present

## 2015-08-24 DIAGNOSIS — I2510 Atherosclerotic heart disease of native coronary artery without angina pectoris: Secondary | ICD-10-CM | POA: Diagnosis not present

## 2015-08-24 DIAGNOSIS — I10 Essential (primary) hypertension: Secondary | ICD-10-CM | POA: Diagnosis not present

## 2015-08-25 DIAGNOSIS — N2581 Secondary hyperparathyroidism of renal origin: Secondary | ICD-10-CM | POA: Diagnosis not present

## 2015-08-25 DIAGNOSIS — N186 End stage renal disease: Secondary | ICD-10-CM | POA: Diagnosis not present

## 2015-08-25 DIAGNOSIS — Z992 Dependence on renal dialysis: Secondary | ICD-10-CM | POA: Diagnosis not present

## 2015-08-27 DIAGNOSIS — Z992 Dependence on renal dialysis: Secondary | ICD-10-CM | POA: Diagnosis not present

## 2015-08-27 DIAGNOSIS — N186 End stage renal disease: Secondary | ICD-10-CM | POA: Diagnosis not present

## 2015-08-28 DIAGNOSIS — N2581 Secondary hyperparathyroidism of renal origin: Secondary | ICD-10-CM | POA: Diagnosis not present

## 2015-08-28 DIAGNOSIS — Z992 Dependence on renal dialysis: Secondary | ICD-10-CM | POA: Diagnosis not present

## 2015-08-28 DIAGNOSIS — N186 End stage renal disease: Secondary | ICD-10-CM | POA: Diagnosis not present

## 2015-08-30 ENCOUNTER — Encounter (HOSPITAL_COMMUNITY): Payer: Self-pay | Admitting: *Deleted

## 2015-08-30 DIAGNOSIS — N2581 Secondary hyperparathyroidism of renal origin: Secondary | ICD-10-CM | POA: Diagnosis not present

## 2015-08-30 DIAGNOSIS — Z992 Dependence on renal dialysis: Secondary | ICD-10-CM | POA: Diagnosis not present

## 2015-08-30 DIAGNOSIS — N186 End stage renal disease: Secondary | ICD-10-CM | POA: Diagnosis not present

## 2015-08-30 NOTE — Progress Notes (Signed)
Pt has seen Dr. Candee Furbish for cardiac workup and clearance. Clearance note in EPIC dated 08/15/15. Pt denies any recent chest pain or sob.   Echo - 06/27/15 - in [place] - 06/27/15 - in EPIC Cath - 07/11/15 - in EPIC EKG - 07/11/15 - in [facility]

## 2015-08-31 ENCOUNTER — Ambulatory Visit (HOSPITAL_COMMUNITY): Payer: Medicare Other | Admitting: Anesthesiology

## 2015-08-31 ENCOUNTER — Encounter (HOSPITAL_COMMUNITY)
Admission: RE | Disposition: A | Discharge status: Home / Self Care | Payer: Self-pay | Source: Ambulatory Visit | Attending: Vascular Surgery

## 2015-08-31 ENCOUNTER — Ambulatory Visit (HOSPITAL_COMMUNITY)
Admission: RE | Admit: 2015-08-31 | Discharge: 2015-08-31 | Disposition: A | Discharge status: Home / Self Care | Payer: Medicare Other | Source: Ambulatory Visit | Attending: Vascular Surgery | Admitting: Vascular Surgery

## 2015-08-31 ENCOUNTER — Other Ambulatory Visit: Payer: Self-pay | Admitting: *Deleted

## 2015-08-31 ENCOUNTER — Encounter (HOSPITAL_COMMUNITY): Payer: Self-pay | Admitting: *Deleted

## 2015-08-31 DIAGNOSIS — Z87891 Personal history of nicotine dependence: Secondary | ICD-10-CM | POA: Insufficient documentation

## 2015-08-31 DIAGNOSIS — I120 Hypertensive chronic kidney disease with stage 5 chronic kidney disease or end stage renal disease: Secondary | ICD-10-CM | POA: Diagnosis present

## 2015-08-31 DIAGNOSIS — Z79899 Other long term (current) drug therapy: Secondary | ICD-10-CM | POA: Diagnosis not present

## 2015-08-31 DIAGNOSIS — Z7902 Long term (current) use of antithrombotics/antiplatelets: Secondary | ICD-10-CM | POA: Insufficient documentation

## 2015-08-31 DIAGNOSIS — N186 End stage renal disease: Secondary | ICD-10-CM | POA: Diagnosis not present

## 2015-08-31 DIAGNOSIS — Z992 Dependence on renal dialysis: Secondary | ICD-10-CM | POA: Insufficient documentation

## 2015-08-31 DIAGNOSIS — R531 Weakness: Secondary | ICD-10-CM | POA: Insufficient documentation

## 2015-08-31 DIAGNOSIS — Z4931 Encounter for adequacy testing for hemodialysis: Secondary | ICD-10-CM

## 2015-08-31 DIAGNOSIS — I2510 Atherosclerotic heart disease of native coronary artery without angina pectoris: Secondary | ICD-10-CM | POA: Insufficient documentation

## 2015-08-31 DIAGNOSIS — Z8673 Personal history of transient ischemic attack (TIA), and cerebral infarction without residual deficits: Secondary | ICD-10-CM | POA: Diagnosis not present

## 2015-08-31 HISTORY — PX: AV FISTULA PLACEMENT: SHX1204

## 2015-08-31 LAB — POCT I-STAT 4, (NA,K, GLUC, HGB,HCT)
GLUCOSE: 91 mg/dL (ref 65–99)
HCT: 52 % (ref 39.0–52.0)
Hemoglobin: 17.7 g/dL — ABNORMAL HIGH (ref 13.0–17.0)
Potassium: 3.8 mmol/L (ref 3.5–5.1)
Sodium: 136 mmol/L (ref 135–145)

## 2015-08-31 SURGERY — ARTERIOVENOUS (AV) FISTULA CREATION
Anesthesia: Monitor Anesthesia Care | Site: Arm Lower | Laterality: Left

## 2015-08-31 MED ORDER — OXYCODONE-ACETAMINOPHEN 5-325 MG PO TABS
1.0000 | ORAL_TABLET | Freq: Once | ORAL | Status: AC
  Administered 2015-08-31: 1 via ORAL

## 2015-08-31 MED ORDER — PROPOFOL 500 MG/50ML IV EMUL
INTRAVENOUS | Status: DC | PRN
Start: 2015-08-31 — End: 2015-08-31
  Administered 2015-08-31: 25 ug/kg/min via INTRAVENOUS

## 2015-08-31 MED ORDER — FENTANYL CITRATE (PF) 250 MCG/5ML IJ SOLN
INTRAMUSCULAR | Status: AC
  Filled 2015-08-31: qty 5

## 2015-08-31 MED ORDER — HEPARIN SODIUM (PORCINE) 1000 UNIT/ML IJ SOLN
INTRAMUSCULAR | Status: AC
  Filled 2015-08-31: qty 1

## 2015-08-31 MED ORDER — FENTANYL CITRATE (PF) 100 MCG/2ML IJ SOLN
INTRAMUSCULAR | Status: DC | PRN
  Administered 2015-08-31 (×2): 50 ug via INTRAVENOUS

## 2015-08-31 MED ORDER — CHLORHEXIDINE GLUCONATE CLOTH 2 % EX PADS: 6.0000 | MEDICATED_PAD | Freq: Once | CUTANEOUS | Status: DC

## 2015-08-31 MED ORDER — SODIUM CHLORIDE 0.9 % IV SOLN: INTRAVENOUS | Status: DC

## 2015-08-31 MED ORDER — SUCCINYLCHOLINE CHLORIDE 200 MG/10ML IV SOSY
PREFILLED_SYRINGE | INTRAVENOUS | Status: AC
  Filled 2015-08-31: qty 10

## 2015-08-31 MED ORDER — LIDOCAINE-EPINEPHRINE (PF) 1 %-1:200000 IJ SOLN
INTRAMUSCULAR | Status: DC | PRN
  Administered 2015-08-31: 13 mL

## 2015-08-31 MED ORDER — OXYCODONE-ACETAMINOPHEN 5-325 MG PO TABS
1.0000 | ORAL_TABLET | Freq: Four times a day (QID) | ORAL | Status: DC | PRN
Start: 2015-08-31 — End: 2015-12-14

## 2015-08-31 MED ORDER — FENTANYL CITRATE (PF) 100 MCG/2ML IJ SOLN: 25.0000 ug | INTRAMUSCULAR | Status: DC | PRN

## 2015-08-31 MED ORDER — PROPOFOL 10 MG/ML IV BOLUS
INTRAVENOUS | Status: AC
  Filled 2015-08-31: qty 20

## 2015-08-31 MED ORDER — ONDANSETRON HCL 4 MG/2ML IJ SOLN: 4.0000 mg | Freq: Once | INTRAMUSCULAR | Status: DC | PRN

## 2015-08-31 MED ORDER — OXYCODONE HCL 5 MG/5ML PO SOLN: 5.0000 mg | Freq: Once | ORAL | Status: AC | PRN

## 2015-08-31 MED ORDER — SODIUM CHLORIDE 0.9 % IV SOLN
INTRAVENOUS | Status: DC | PRN
  Administered 2015-08-31 (×2): via INTRAVENOUS

## 2015-08-31 MED ORDER — SODIUM CHLORIDE 0.9 % IV SOLN
INTRAVENOUS | Status: DC | PRN
  Administered 2015-08-31: 07:00:00

## 2015-08-31 MED ORDER — PHENYLEPHRINE 40 MCG/ML (10ML) SYRINGE FOR IV PUSH (FOR BLOOD PRESSURE SUPPORT)
PREFILLED_SYRINGE | INTRAVENOUS | Status: AC
  Filled 2015-08-31: qty 10

## 2015-08-31 MED ORDER — DEXTROSE 5 % IV SOLN
INTRAVENOUS | Status: AC
  Filled 2015-08-31: qty 1.5

## 2015-08-31 MED ORDER — DEXTROSE 5 % IV SOLN
1.5000 g | INTRAVENOUS | Status: AC
  Administered 2015-08-31: 1.5 g via INTRAVENOUS

## 2015-08-31 MED ORDER — LIDOCAINE HCL (CARDIAC) 20 MG/ML IV SOLN
INTRAVENOUS | Status: DC | PRN
  Administered 2015-08-31: 50 mg via INTRAVENOUS

## 2015-08-31 MED ORDER — LIDOCAINE 2% (20 MG/ML) 5 ML SYRINGE
INTRAMUSCULAR | Status: AC
  Filled 2015-08-31: qty 5

## 2015-08-31 MED ORDER — OXYCODONE HCL 5 MG PO TABS
ORAL_TABLET | ORAL | Status: AC
  Filled 2015-08-31: qty 1

## 2015-08-31 MED ORDER — PHENYLEPHRINE HCL 10 MG/ML IJ SOLN
10.0000 mg | INTRAVENOUS | Status: DC | PRN
  Administered 2015-08-31: 40 ug/min via INTRAVENOUS

## 2015-08-31 MED ORDER — 0.9 % SODIUM CHLORIDE (POUR BTL) OPTIME
TOPICAL | Status: DC | PRN
  Administered 2015-08-31: 1000 mL

## 2015-08-31 MED ORDER — EPHEDRINE 5 MG/ML INJ
INTRAVENOUS | Status: AC
  Filled 2015-08-31: qty 10

## 2015-08-31 MED ORDER — LIDOCAINE-EPINEPHRINE (PF) 1 %-1:200000 IJ SOLN
INTRAMUSCULAR | Status: AC
  Filled 2015-08-31: qty 30

## 2015-08-31 MED ORDER — ONDANSETRON HCL 4 MG/2ML IJ SOLN
INTRAMUSCULAR | Status: AC
  Filled 2015-08-31: qty 2

## 2015-08-31 MED ORDER — OXYCODONE HCL 5 MG PO TABS
5.0000 mg | ORAL_TABLET | Freq: Once | ORAL | Status: AC | PRN
  Administered 2015-08-31: 5 mg via ORAL

## 2015-08-31 MED ORDER — ROCURONIUM BROMIDE 50 MG/5ML IV SOLN
INTRAVENOUS | Status: AC
  Filled 2015-08-31: qty 1

## 2015-08-31 MED ORDER — OXYCODONE-ACETAMINOPHEN 5-325 MG PO TABS
ORAL_TABLET | ORAL | Status: AC
  Filled 2015-08-31: qty 1

## 2015-08-31 MED ORDER — PHENYLEPHRINE HCL 10 MG/ML IJ SOLN
INTRAMUSCULAR | Status: DC | PRN
  Administered 2015-08-31 (×2): 80 ug via INTRAVENOUS

## 2015-08-31 SURGICAL SUPPLY — 32 items
ARMBAND PINK RESTRICT EXTREMIT (MISCELLANEOUS) ×3 IMPLANT
CANISTER SUCTION 2500CC (MISCELLANEOUS) ×3 IMPLANT
CLIP TI MEDIUM 6 (CLIP) ×3 IMPLANT
CLIP TI WIDE RED SMALL 6 (CLIP) ×5 IMPLANT
COVER PROBE W GEL 5X96 (DRAPES) ×2 IMPLANT
DRAIN PENROSE 1/2X12 LTX STRL (WOUND CARE) ×3 IMPLANT
ELECT REM PT RETURN 9FT ADLT (ELECTROSURGICAL) ×3
ELECTRODE REM PT RTRN 9FT ADLT (ELECTROSURGICAL) ×1 IMPLANT
GEL ULTRASOUND 20GR AQUASONIC (MISCELLANEOUS) IMPLANT
GLOVE BIOGEL PI IND STRL 6.5 (GLOVE) IMPLANT
GLOVE BIOGEL PI IND STRL 7.0 (GLOVE) IMPLANT
GLOVE BIOGEL PI IND STRL 7.5 (GLOVE) IMPLANT
GLOVE BIOGEL PI INDICATOR 6.5 (GLOVE) ×2
GLOVE BIOGEL PI INDICATOR 7.0 (GLOVE) ×2
GLOVE BIOGEL PI INDICATOR 7.5 (GLOVE) ×2
GLOVE ECLIPSE 7.0 STRL STRAW (GLOVE) ×2 IMPLANT
GLOVE SS BIOGEL STRL SZ 7 (GLOVE) ×1 IMPLANT
GLOVE SUPERSENSE BIOGEL SZ 7 (GLOVE) ×2
GLOVE SURG SS PI 6.5 STRL IVOR (GLOVE) ×2 IMPLANT
GOWN STRL REUS W/ TWL LRG LVL3 (GOWN DISPOSABLE) ×3 IMPLANT
GOWN STRL REUS W/TWL LRG LVL3 (GOWN DISPOSABLE) ×9
KIT BASIN OR (CUSTOM PROCEDURE TRAY) ×3 IMPLANT
KIT ROOM TURNOVER OR (KITS) ×3 IMPLANT
LIQUID BAND (GAUZE/BANDAGES/DRESSINGS) ×3 IMPLANT
NS IRRIG 1000ML POUR BTL (IV SOLUTION) ×3 IMPLANT
PACK CV ACCESS (CUSTOM PROCEDURE TRAY) ×3 IMPLANT
PAD ARMBOARD 7.5X6 YLW CONV (MISCELLANEOUS) ×6 IMPLANT
SUT PROLENE 6 0 BV (SUTURE) ×5 IMPLANT
SUT VIC AB 3-0 SH 27 (SUTURE) ×3
SUT VIC AB 3-0 SH 27X BRD (SUTURE) ×1 IMPLANT
UNDERPAD 30X30 INCONTINENT (UNDERPADS AND DIAPERS) ×3 IMPLANT
WATER STERILE IRR 1000ML POUR (IV SOLUTION) ×3 IMPLANT

## 2015-08-31 NOTE — Op Note (Signed)
OPERATIVE REPORT  Date of Surgery: 08/31/2015  Surgeon: Tinnie Gens, MD  Assistant: Gerri Lins PA  Pre-op Diagnosis: End Stage Renal Disease  N18.6  Post-op Diagnosis: End Stage Renal Disease  N18.6  Procedure: Procedure(s): ARTERIOVENOUS (AV) FISTULA CREATION- LEFT RADIOCEPHALIC  Anesthesia: Mac  EBL: Minimal  Complications: None  Procedure Details: The patient was taken the operating room placed in supine position at which time left upper extremity was imaged using B mode ultrasound-sono site. The cephalic vein in the left forearm appeared adequate for fistula creation. Patient had a thrombosed upper arm brachial-cephalic AV fistula. After prepping and draping in routine sterile manner and infiltration of 1% Xylocaine with epinephrine short longitudinal incision was made proximal the wrist between the radial artery and cephalic vein. Cephalic vein was dissected free branches ligated with 4-0 silk ties and divided. It was ligated distally transected gently dilated with heparinized saline and marked for orientation purposes. It was a satisfactory vein being at least 2-1/2 3 mm in size. Radial artery was then exposed. It was diffusely diseased with calcification but did have soft areas and had a good pulse and was of a large caliber. It was encircled proximally and distally with Vesseloops. It was then occluded proximally and distally with small vascular clamps opened longitudinally 15 blade extended with the Potts scissors. There was good inflow and backbleeding. Vein was carefully measured spatulated and anastomosed end-to-side with 6-0 Prolene. Clamps were then released there was a good pulse and palpable thrill in the fistula up to the antecubital area. There was good Doppler flow. The distal radial Doppler flow decreased with the fistula open and improved with compression of the fistula but never disappeared. Adequate hemostasis was achieved wound closed in layers with Vicryl  subcuticular fashion with Dermabond patient taken to recovery room in satisfactory condition   Tinnie Gens, MD 08/31/2015 8:52 AM

## 2015-08-31 NOTE — H&P (Signed)
Vascular Surgery Consultation  Reason for Consult: Needs vascular access  HPI: [name] is a 75 y.o. male who presents for evaluation of vascular access. Patient is right-handed. 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 Now presents for further access in left upper extremity   Past Medical History  Diagnosis Date  . Hypertension   . CVA (cerebral infarction)   . Gastric ulcer 2004    treated for h.pylori see PSH  . LVH (left ventricular hypertrophy)   . Dialysis patient ([place])   . Stroke [facility])     left side weakness  . Cardiomyopathy ([place])   . ESRD (end stage renal disease) on dialysis [facility])     M/W/F   Past Surgical History  Procedure Laterality Date  . Cholecystectomy    . Shoulder surgery    . Colonoscopy  2004    Dr. Irving Shows, left sided diverticula and cecal polyp, path unknown  . Esophagogastroduodenoscopy  11/2002    Dr. Gala Romney, erosive reflux esophagitis, multiple gastric ulcer and antral/bulbar erosions. Serologies positive for H.Pylori and was treated  . Esophagogastroduodenoscopy  11/20014    Dr. Gala Romney, small hh only, ulcers healed  . Esophagogastroduodenoscopy  09/21/2011    Dr Trevor Iha [facility], antral erosions, ?early GAVE  . Colonoscopy  10/29/2011    Procedure: COLONOSCOPY;  Surgeon: Daneil Dolin, MD;  [facility];  Service: Endoscopy;  Laterality: N/A;  10:15  . Arteriovenous graft placement Right right arm  . Abdominal aortagram N/A 01/24/2012    Procedure: ABDOMINAL Maxcine Ham;  Surgeon: Elam Dutch, MD;  Location: [facility] CATH LAB;  Service: Cardiovascular;  Laterality: N/A;  . Carpel tunnel Left Dec. 22, 2016  . Cardiac catheterization N/A 07/11/2015    Procedure: Left Heart Cath and Coronary Angiography;  Surgeon: Troy Sine, MD;  Location: [place] CV LAB;  Service: Cardiovascular;  Laterality: N/A;   Social History   Social History  . Marital Status: Single    Spouse Name: N/A  .  Number of Children: 2  . Years of Education: N/A   Occupational History  . retired, [employer]    Social History Main Topics  . Smoking status: Former Smoker    Quit date: 04/29/2004  . Smokeless tobacco: Former Systems developer    Types: [place] date: 01/16/1987     Comment: quit 2006  . Alcohol Use: No  . Drug Use: No  . Sexual Activity: Not Asked   Other Topics Concern  . None   Social History Narrative   Lives alone   Daughter 20-min away   Caffeine use: 32oz soda per day   Family History  Problem Relation Age of Onset  . Colon cancer Neg Hx   . Liver disease Neg Hx   . Hypertension Mother    Allergies  Allergen Reactions  . Aspirin Other (See Comments)    Causes internal bleeding    Prior to Admission medications   Medication Sig Start Date End Date Taking? Authorizing Provider  ALPRAZolam Duanne Moron) 0.5 MG tablet Take 0.5 mg by mouth at bedtime as needed for anxiety.    Yes Historical Provider, MD  amLODipine (NORVASC) 10 MG tablet Take 10 mg by mouth daily.   Yes Historical Provider, MD  atorvastatin (LIPITOR) 40 MG tablet Take 1 tablet (40 mg total) by mouth daily at 6 PM. 07/12/15  Yes Erlene Quan, PA-C  b complex-vitamin c-folic acid (NEPHRO-VITE) 0.8 MG TABS  Take 0.8 mg by mouth as directed. Takes on Tuesdays, Thursdays, Saturdays, and Sundays. Does not take on Mondays, Wednesdays, and Fridays due to Dialysis treatments.   Yes Historical Provider, MD  cinacalcet (SENSIPAR) 60 MG tablet Take 60 mg by mouth every evening. With evening meal   Yes Historical Provider, MD  clopidogrel (PLAVIX) 75 MG tablet Take 75 mg by mouth daily. Reported on 05/30/2015 07/26/10  Yes Historical Provider, MD  meclizine (ANTIVERT) 25 MG tablet Take 25 mg by mouth daily.   Yes Historical Provider, MD  metoprolol succinate (TOPROL-XL) 50 MG 24 hr tablet Take 50 mg by mouth daily. Reported on 05/30/2015   Yes Historical Provider, MD  omeprazole (PRILOSEC) 20 MG capsule Take 20 mg by mouth daily.  Reported on 05/30/2015 07/30/10  Yes Historical Provider, MD  promethazine (PHENERGAN) 25 MG tablet Take 25 mg by mouth every 6 (six) hours as needed for nausea. Reported on 05/30/2015 07/22/11  Yes Historical Provider, MD  sevelamer (RENVELA) 800 MG tablet Take 2,400-4,000 mg by mouth 3 (three) times daily with meals. Patient takes 5 tablets with meals and 3 tablets with snacks   Yes Historical Provider, MD  acetaminophen (TYLENOL) 325 MG tablet Take 2 tablets (650 mg total) by mouth every 4 (four) hours as needed for headache or mild pain. 07/11/15   Erlene Quan, PA-C     Positive ROS:   All other systems have been reviewed and were otherwise negative with the exception of those mentioned in the HPI and as above.  Physical Exam: Filed Vitals:   08/31/15 0633  BP: 104/71  Pulse: 91  Temp: 98.1 F (36.7 C)  Resp: 20    General: Alert, no acute distress HEENT: Normal for age Cardiovascular: Regular rate and rhythm. Carotid pulses 2+, no bruits audible Respiratory: Clear to auscultation. No cyanosis, no use of accessory musculature GI: No organomegaly, abdomen is soft and non-tender Skin: No lesions in the area of chief complaint Neurologic: Sensation intact distally Psychiatric: Patient is competent for consent with normal mood and affect Musculoskeletal: No obvious deformities Extremities: Left upper extremity with thrombosed upper arm brachial-cephalic AV fistula with multiple aneurysmal changes 2+ radial pulse palpable  Imaging reviewed: The cephalic vein in the left arm appears to be patent from distal forearm to the axilla through the basilic system at the antecubital area.   Assessment/Plan:  Plan left arm AV fistula either radial cephalic or basilic vein transposition depending on operative findings   Tinnie Gens, MD 08/31/2015 7:35 AM

## 2015-08-31 NOTE — Anesthesia Postprocedure Evaluation (Signed)
Anesthesia Post Note  Patient: CHRISTOPHERJOHN GHERE  Procedure(s) Performed: Procedure(s) (LRB): ARTERIOVENOUS (AV) FISTULA CREATION- LEFT RADIOCEPHALIC (Left)  Patient location during evaluation: PACU Anesthesia Type: MAC Level of consciousness: awake, awake and alert and oriented Pain management: pain level controlled Vital Signs Assessment: post-procedure vital signs reviewed and stable Respiratory status: spontaneous breathing, nonlabored ventilation and respiratory function stable Anesthetic complications: no    Last Vitals:  Filed Vitals:   08/31/15 1000 08/31/15 1018  BP: 125/86 128/79  Pulse: 82 84  Temp: 36.7 C   Resp: 17 18    Last Pain:  Filed Vitals:   08/31/15 1021  PainSc: 0-No pain                 Orlena Garmon COKER

## 2015-08-31 NOTE — Progress Notes (Signed)
Pt A&O X4. VSS. Minimal pain. Sitting up in stretcher. Ready for discharge to home, but no ride. Pt is attempting to contact someone.

## 2015-08-31 NOTE — Anesthesia Preprocedure Evaluation (Addendum)
Anesthesia Evaluation  Patient identified by MRN, date of birth, ID band Patient awake    Reviewed: Allergy & Precautions, NPO status , Patient's Chart, lab work & pertinent test results  History of Anesthesia Complications Negative for: history of anesthetic complications  Airway Mallampati: II  TM Distance: >3 FB     Dental  (+) Edentulous Upper, Edentulous Lower   Pulmonary former smoker,    Pulmonary exam normal breath sounds clear to auscultation       Cardiovascular hypertension, + CAD and + Peripheral Vascular Disease   Rhythm:Regular Rate:Normal     Neuro/Psych Left leg weakness CVA    GI/Hepatic PUD,   Endo/Other    Renal/GU ESRFRenal disease     Musculoskeletal   Abdominal Normal abdominal exam  (+)   Peds  Hematology   Anesthesia Other Findings   Reproductive/Obstetrics                          Anesthesia Physical Anesthesia Plan  ASA: IV  Anesthesia Plan: MAC   Post-op Pain Management:    Induction: Intravenous  Airway Management Planned: Mask  Additional Equipment:   Intra-op Plan:   Post-operative Plan:   Informed Consent: I have reviewed the patients History and Physical, chart, labs and discussed the procedure including the risks, benefits and alternatives for the proposed anesthesia with the patient or authorized representative who has indicated his/her understanding and acceptance.     Plan Discussed with: CRNA, Anesthesiologist and Surgeon  Anesthesia Plan Comments:        Anesthesia Quick Evaluation

## 2015-08-31 NOTE — Transfer of Care (Signed)
Immediate Anesthesia Transfer of Care Note  Patient: [name]  Procedure(s) Performed: Procedure(s): ARTERIOVENOUS (AV) FISTULA CREATION- LEFT RADIOCEPHALIC (Left)  Patient Location: PACU  Anesthesia Type:MAC  Level of Consciousness: awake, alert , oriented and patient cooperative  Airway & Oxygen Therapy: Patient Spontanous Breathing  Post-op Assessment: Report given to RN, Post -op Vital signs reviewed and stable and Patient moving all extremities  Post vital signs: Reviewed and stable  Last Vitals:  Filed Vitals:   08/31/15 0633  BP: 104/71  Pulse: 91  Temp: 36.7 C  Resp: 20    Last Pain: There were no vitals filed for this visit.    Patients Stated Pain Goal: 5 ([id number])  Complications: No apparent anesthesia complications

## 2015-09-01 ENCOUNTER — Encounter (HOSPITAL_COMMUNITY): Payer: Self-pay | Admitting: Vascular Surgery

## 2015-09-01 DIAGNOSIS — N186 End stage renal disease: Secondary | ICD-10-CM | POA: Diagnosis not present

## 2015-09-01 DIAGNOSIS — Z992 Dependence on renal dialysis: Secondary | ICD-10-CM | POA: Diagnosis not present

## 2015-09-01 DIAGNOSIS — N2581 Secondary hyperparathyroidism of renal origin: Secondary | ICD-10-CM | POA: Diagnosis not present

## 2015-09-04 ENCOUNTER — Telehealth: Payer: Self-pay | Admitting: Vascular Surgery

## 2015-09-04 DIAGNOSIS — N186 End stage renal disease: Secondary | ICD-10-CM | POA: Diagnosis not present

## 2015-09-04 DIAGNOSIS — Z992 Dependence on renal dialysis: Secondary | ICD-10-CM | POA: Diagnosis not present

## 2015-09-04 DIAGNOSIS — N2581 Secondary hyperparathyroidism of renal origin: Secondary | ICD-10-CM | POA: Diagnosis not present

## 2015-09-04 NOTE — Telephone Encounter (Signed)
sched lab 5/26 at 2 and md 5/30at 1:45. Spoke to pt to inform them of appt.

## 2015-09-04 NOTE — Telephone Encounter (Signed)
-----   Message from Mena Goes, RN sent at 08/31/2015  1:08 PM EDT ----- Regarding: schedule   ----- Message -----    From: Ulyses Amor, PA-C    Sent: 08/31/2015   8:41 AM      To: Vvs Charge Pool  F/U with Dr.Lawson in 4 weeks with a duplex of the left arm fistula

## 2015-09-06 DIAGNOSIS — N186 End stage renal disease: Secondary | ICD-10-CM | POA: Diagnosis not present

## 2015-09-06 DIAGNOSIS — N2581 Secondary hyperparathyroidism of renal origin: Secondary | ICD-10-CM | POA: Diagnosis not present

## 2015-09-06 DIAGNOSIS — Z992 Dependence on renal dialysis: Secondary | ICD-10-CM | POA: Diagnosis not present

## 2015-09-08 DIAGNOSIS — N2581 Secondary hyperparathyroidism of renal origin: Secondary | ICD-10-CM | POA: Diagnosis not present

## 2015-09-08 DIAGNOSIS — N186 End stage renal disease: Secondary | ICD-10-CM | POA: Diagnosis not present

## 2015-09-08 DIAGNOSIS — Z992 Dependence on renal dialysis: Secondary | ICD-10-CM | POA: Diagnosis not present

## 2015-09-11 DIAGNOSIS — N2581 Secondary hyperparathyroidism of renal origin: Secondary | ICD-10-CM | POA: Diagnosis not present

## 2015-09-11 DIAGNOSIS — Z992 Dependence on renal dialysis: Secondary | ICD-10-CM | POA: Diagnosis not present

## 2015-09-11 DIAGNOSIS — N186 End stage renal disease: Secondary | ICD-10-CM | POA: Diagnosis not present

## 2015-09-13 DIAGNOSIS — N186 End stage renal disease: Secondary | ICD-10-CM | POA: Diagnosis not present

## 2015-09-13 DIAGNOSIS — Z992 Dependence on renal dialysis: Secondary | ICD-10-CM | POA: Diagnosis not present

## 2015-09-13 DIAGNOSIS — N2581 Secondary hyperparathyroidism of renal origin: Secondary | ICD-10-CM | POA: Diagnosis not present

## 2015-09-15 DIAGNOSIS — N186 End stage renal disease: Secondary | ICD-10-CM | POA: Diagnosis not present

## 2015-09-15 DIAGNOSIS — N2581 Secondary hyperparathyroidism of renal origin: Secondary | ICD-10-CM | POA: Diagnosis not present

## 2015-09-15 DIAGNOSIS — Z992 Dependence on renal dialysis: Secondary | ICD-10-CM | POA: Diagnosis not present

## 2015-09-18 DIAGNOSIS — N186 End stage renal disease: Secondary | ICD-10-CM | POA: Diagnosis not present

## 2015-09-18 DIAGNOSIS — Z992 Dependence on renal dialysis: Secondary | ICD-10-CM | POA: Diagnosis not present

## 2015-09-18 DIAGNOSIS — N2581 Secondary hyperparathyroidism of renal origin: Secondary | ICD-10-CM | POA: Diagnosis not present

## 2015-09-19 DIAGNOSIS — E1142 Type 2 diabetes mellitus with diabetic polyneuropathy: Secondary | ICD-10-CM | POA: Diagnosis not present

## 2015-09-19 DIAGNOSIS — B351 Tinea unguium: Secondary | ICD-10-CM | POA: Diagnosis not present

## 2015-09-19 DIAGNOSIS — L851 Acquired keratosis [keratoderma] palmaris et plantaris: Secondary | ICD-10-CM | POA: Diagnosis not present

## 2015-09-20 ENCOUNTER — Encounter: Payer: Self-pay | Admitting: Vascular Surgery

## 2015-09-20 DIAGNOSIS — Z992 Dependence on renal dialysis: Secondary | ICD-10-CM | POA: Diagnosis not present

## 2015-09-20 DIAGNOSIS — N186 End stage renal disease: Secondary | ICD-10-CM | POA: Diagnosis not present

## 2015-09-20 DIAGNOSIS — N2581 Secondary hyperparathyroidism of renal origin: Secondary | ICD-10-CM | POA: Diagnosis not present

## 2015-09-22 ENCOUNTER — Ambulatory Visit (HOSPITAL_COMMUNITY)
Admission: RE | Admit: 2015-09-22 | Discharge: 2015-09-22 | Disposition: A | Discharge status: Home / Self Care | Payer: Medicare Other | Source: Ambulatory Visit | Attending: Vascular Surgery | Admitting: Vascular Surgery

## 2015-09-22 DIAGNOSIS — N186 End stage renal disease: Secondary | ICD-10-CM | POA: Insufficient documentation

## 2015-09-22 DIAGNOSIS — N2581 Secondary hyperparathyroidism of renal origin: Secondary | ICD-10-CM | POA: Diagnosis not present

## 2015-09-22 DIAGNOSIS — Z992 Dependence on renal dialysis: Secondary | ICD-10-CM | POA: Diagnosis not present

## 2015-09-22 DIAGNOSIS — Z4931 Encounter for adequacy testing for hemodialysis: Secondary | ICD-10-CM | POA: Insufficient documentation

## 2015-09-22 DIAGNOSIS — I120 Hypertensive chronic kidney disease with stage 5 chronic kidney disease or end stage renal disease: Secondary | ICD-10-CM | POA: Diagnosis not present

## 2015-09-25 DIAGNOSIS — N2581 Secondary hyperparathyroidism of renal origin: Secondary | ICD-10-CM | POA: Diagnosis not present

## 2015-09-25 DIAGNOSIS — Z992 Dependence on renal dialysis: Secondary | ICD-10-CM | POA: Diagnosis not present

## 2015-09-25 DIAGNOSIS — N186 End stage renal disease: Secondary | ICD-10-CM | POA: Diagnosis not present

## 2015-09-26 ENCOUNTER — Ambulatory Visit (INDEPENDENT_AMBULATORY_CARE_PROVIDER_SITE_OTHER): Payer: Self-pay | Admitting: Vascular Surgery

## 2015-09-26 ENCOUNTER — Encounter: Payer: Self-pay | Admitting: Vascular Surgery

## 2015-09-26 VITALS — BP 120/73 | HR 76 | Temp 97.8°F | Resp 16 | Ht 65.0 in | Wt 180.0 lb

## 2015-09-26 DIAGNOSIS — Z992 Dependence on renal dialysis: Secondary | ICD-10-CM

## 2015-09-26 DIAGNOSIS — N186 End stage renal disease: Secondary | ICD-10-CM

## 2015-09-26 NOTE — Progress Notes (Signed)
Subjective:     Patient ID: [name], male   DOB: Nov 11, 1940, 75 y.o.   MRN: [id number]  HPI this 75 year old male returns for initial follow-up regarding the left radial-cephalic AV fistula I created on 09/02/2015. Patient is on hemodialysis through a tunneled catheter in the left IJ. He has had no severe pain or numbness in the entire left hand but does have some numbness in the left third finger since the procedure.   Review of Systems     Objective:   Physical Exam BP 120/73 mmHg  Pulse 76  Temp(Src) 97.8 F (36.6 C)  Resp 16  Ht 5\' 5"  (1.651 m)  Wt 180 lb (81.647 kg)  BMI 29.95 kg/m2  SpO2 99%  Gen. well-developed well-nourished male no apparent distress alert and oriented 3 Left upper extremity with slowly healing incision proximal to the wrist where the anastomosis was performed. Left hand is well perfused. 3 mm wide eschar at the level of the incision but no infection. The fistula itself is readily palpable with good pulse and palpable thrill.  Today I ordered a duplex scan of the fistula which I reviewed and interpreted. There is to use very small branches in the forearm. The fistula itself is about 0.22 -0.25 cm in diameter throughout the forearm. There is some calcification in the artery.     Assessment:     Widely patent left radial-cephalic AV fistula created on 09/02/2015 and patient currently on hemodialysis with tunneled catheter left IJ    Plan:     Okay to use fistula following 12/03/2015 Return to see [place] on when necessary basis

## 2015-09-27 DIAGNOSIS — Z992 Dependence on renal dialysis: Secondary | ICD-10-CM | POA: Diagnosis not present

## 2015-09-27 DIAGNOSIS — N186 End stage renal disease: Secondary | ICD-10-CM | POA: Diagnosis not present

## 2015-09-27 DIAGNOSIS — N2581 Secondary hyperparathyroidism of renal origin: Secondary | ICD-10-CM | POA: Diagnosis not present

## 2015-09-29 DIAGNOSIS — N186 End stage renal disease: Secondary | ICD-10-CM | POA: Diagnosis not present

## 2015-09-29 DIAGNOSIS — Z992 Dependence on renal dialysis: Secondary | ICD-10-CM | POA: Diagnosis not present

## 2015-09-29 DIAGNOSIS — N2581 Secondary hyperparathyroidism of renal origin: Secondary | ICD-10-CM | POA: Diagnosis not present

## 2015-09-29 DIAGNOSIS — D509 Iron deficiency anemia, unspecified: Secondary | ICD-10-CM | POA: Diagnosis not present

## 2015-10-02 DIAGNOSIS — N2581 Secondary hyperparathyroidism of renal origin: Secondary | ICD-10-CM | POA: Diagnosis not present

## 2015-10-02 DIAGNOSIS — D509 Iron deficiency anemia, unspecified: Secondary | ICD-10-CM | POA: Diagnosis not present

## 2015-10-02 DIAGNOSIS — Z992 Dependence on renal dialysis: Secondary | ICD-10-CM | POA: Diagnosis not present

## 2015-10-02 DIAGNOSIS — N186 End stage renal disease: Secondary | ICD-10-CM | POA: Diagnosis not present

## 2015-10-04 DIAGNOSIS — N186 End stage renal disease: Secondary | ICD-10-CM | POA: Diagnosis not present

## 2015-10-04 DIAGNOSIS — N2581 Secondary hyperparathyroidism of renal origin: Secondary | ICD-10-CM | POA: Diagnosis not present

## 2015-10-04 DIAGNOSIS — D509 Iron deficiency anemia, unspecified: Secondary | ICD-10-CM | POA: Diagnosis not present

## 2015-10-04 DIAGNOSIS — Z992 Dependence on renal dialysis: Secondary | ICD-10-CM | POA: Diagnosis not present

## 2015-10-06 DIAGNOSIS — D509 Iron deficiency anemia, unspecified: Secondary | ICD-10-CM | POA: Diagnosis not present

## 2015-10-06 DIAGNOSIS — N186 End stage renal disease: Secondary | ICD-10-CM | POA: Diagnosis not present

## 2015-10-06 DIAGNOSIS — Z992 Dependence on renal dialysis: Secondary | ICD-10-CM | POA: Diagnosis not present

## 2015-10-06 DIAGNOSIS — N2581 Secondary hyperparathyroidism of renal origin: Secondary | ICD-10-CM | POA: Diagnosis not present

## 2015-10-09 DIAGNOSIS — N2581 Secondary hyperparathyroidism of renal origin: Secondary | ICD-10-CM | POA: Diagnosis not present

## 2015-10-09 DIAGNOSIS — Z992 Dependence on renal dialysis: Secondary | ICD-10-CM | POA: Diagnosis not present

## 2015-10-09 DIAGNOSIS — N186 End stage renal disease: Secondary | ICD-10-CM | POA: Diagnosis not present

## 2015-10-09 DIAGNOSIS — D509 Iron deficiency anemia, unspecified: Secondary | ICD-10-CM | POA: Diagnosis not present

## 2015-10-10 DIAGNOSIS — G5601 Carpal tunnel syndrome, right upper limb: Secondary | ICD-10-CM | POA: Diagnosis not present

## 2015-10-11 DIAGNOSIS — Z992 Dependence on renal dialysis: Secondary | ICD-10-CM | POA: Diagnosis not present

## 2015-10-11 DIAGNOSIS — N2581 Secondary hyperparathyroidism of renal origin: Secondary | ICD-10-CM | POA: Diagnosis not present

## 2015-10-11 DIAGNOSIS — N186 End stage renal disease: Secondary | ICD-10-CM | POA: Diagnosis not present

## 2015-10-11 DIAGNOSIS — D509 Iron deficiency anemia, unspecified: Secondary | ICD-10-CM | POA: Diagnosis not present

## 2015-10-13 DIAGNOSIS — D509 Iron deficiency anemia, unspecified: Secondary | ICD-10-CM | POA: Diagnosis not present

## 2015-10-13 DIAGNOSIS — N2581 Secondary hyperparathyroidism of renal origin: Secondary | ICD-10-CM | POA: Diagnosis not present

## 2015-10-13 DIAGNOSIS — Z992 Dependence on renal dialysis: Secondary | ICD-10-CM | POA: Diagnosis not present

## 2015-10-13 DIAGNOSIS — N186 End stage renal disease: Secondary | ICD-10-CM | POA: Diagnosis not present

## 2015-10-16 DIAGNOSIS — N186 End stage renal disease: Secondary | ICD-10-CM | POA: Diagnosis not present

## 2015-10-16 DIAGNOSIS — D509 Iron deficiency anemia, unspecified: Secondary | ICD-10-CM | POA: Diagnosis not present

## 2015-10-16 DIAGNOSIS — Z992 Dependence on renal dialysis: Secondary | ICD-10-CM | POA: Diagnosis not present

## 2015-10-16 DIAGNOSIS — N2581 Secondary hyperparathyroidism of renal origin: Secondary | ICD-10-CM | POA: Diagnosis not present

## 2015-10-18 DIAGNOSIS — N2581 Secondary hyperparathyroidism of renal origin: Secondary | ICD-10-CM | POA: Diagnosis not present

## 2015-10-18 DIAGNOSIS — N186 End stage renal disease: Secondary | ICD-10-CM | POA: Diagnosis not present

## 2015-10-18 DIAGNOSIS — D509 Iron deficiency anemia, unspecified: Secondary | ICD-10-CM | POA: Diagnosis not present

## 2015-10-18 DIAGNOSIS — Z992 Dependence on renal dialysis: Secondary | ICD-10-CM | POA: Diagnosis not present

## 2015-10-20 DIAGNOSIS — D509 Iron deficiency anemia, unspecified: Secondary | ICD-10-CM | POA: Diagnosis not present

## 2015-10-20 DIAGNOSIS — N186 End stage renal disease: Secondary | ICD-10-CM | POA: Diagnosis not present

## 2015-10-20 DIAGNOSIS — Z992 Dependence on renal dialysis: Secondary | ICD-10-CM | POA: Diagnosis not present

## 2015-10-20 DIAGNOSIS — N2581 Secondary hyperparathyroidism of renal origin: Secondary | ICD-10-CM | POA: Diagnosis not present

## 2015-10-23 DIAGNOSIS — Z992 Dependence on renal dialysis: Secondary | ICD-10-CM | POA: Diagnosis not present

## 2015-10-23 DIAGNOSIS — N186 End stage renal disease: Secondary | ICD-10-CM | POA: Diagnosis not present

## 2015-10-23 DIAGNOSIS — D509 Iron deficiency anemia, unspecified: Secondary | ICD-10-CM | POA: Diagnosis not present

## 2015-10-23 DIAGNOSIS — N2581 Secondary hyperparathyroidism of renal origin: Secondary | ICD-10-CM | POA: Diagnosis not present

## 2015-10-24 DIAGNOSIS — L97522 Non-pressure chronic ulcer of other part of left foot with fat layer exposed: Secondary | ICD-10-CM | POA: Diagnosis not present

## 2015-10-24 DIAGNOSIS — E1142 Type 2 diabetes mellitus with diabetic polyneuropathy: Secondary | ICD-10-CM | POA: Diagnosis not present

## 2015-10-25 DIAGNOSIS — Z992 Dependence on renal dialysis: Secondary | ICD-10-CM | POA: Diagnosis not present

## 2015-10-25 DIAGNOSIS — N186 End stage renal disease: Secondary | ICD-10-CM | POA: Diagnosis not present

## 2015-10-25 DIAGNOSIS — N2581 Secondary hyperparathyroidism of renal origin: Secondary | ICD-10-CM | POA: Diagnosis not present

## 2015-10-25 DIAGNOSIS — D509 Iron deficiency anemia, unspecified: Secondary | ICD-10-CM | POA: Diagnosis not present

## 2015-10-27 DIAGNOSIS — N186 End stage renal disease: Secondary | ICD-10-CM | POA: Diagnosis not present

## 2015-10-27 DIAGNOSIS — Z992 Dependence on renal dialysis: Secondary | ICD-10-CM | POA: Diagnosis not present

## 2015-10-27 DIAGNOSIS — D509 Iron deficiency anemia, unspecified: Secondary | ICD-10-CM | POA: Diagnosis not present

## 2015-10-27 DIAGNOSIS — N2581 Secondary hyperparathyroidism of renal origin: Secondary | ICD-10-CM | POA: Diagnosis not present

## 2015-10-30 DIAGNOSIS — D509 Iron deficiency anemia, unspecified: Secondary | ICD-10-CM | POA: Diagnosis not present

## 2015-10-30 DIAGNOSIS — Z992 Dependence on renal dialysis: Secondary | ICD-10-CM | POA: Diagnosis not present

## 2015-10-30 DIAGNOSIS — N186 End stage renal disease: Secondary | ICD-10-CM | POA: Diagnosis not present

## 2015-10-30 DIAGNOSIS — N2581 Secondary hyperparathyroidism of renal origin: Secondary | ICD-10-CM | POA: Diagnosis not present

## 2015-10-30 DIAGNOSIS — T82898A Other specified complication of vascular prosthetic devices, implants and grafts, initial encounter: Secondary | ICD-10-CM | POA: Diagnosis not present

## 2015-11-01 DIAGNOSIS — N186 End stage renal disease: Secondary | ICD-10-CM | POA: Diagnosis not present

## 2015-11-01 DIAGNOSIS — Z992 Dependence on renal dialysis: Secondary | ICD-10-CM | POA: Diagnosis not present

## 2015-11-01 DIAGNOSIS — D509 Iron deficiency anemia, unspecified: Secondary | ICD-10-CM | POA: Diagnosis not present

## 2015-11-01 DIAGNOSIS — N2581 Secondary hyperparathyroidism of renal origin: Secondary | ICD-10-CM | POA: Diagnosis not present

## 2015-11-01 DIAGNOSIS — T82898A Other specified complication of vascular prosthetic devices, implants and grafts, initial encounter: Secondary | ICD-10-CM | POA: Diagnosis not present

## 2015-11-03 DIAGNOSIS — D509 Iron deficiency anemia, unspecified: Secondary | ICD-10-CM | POA: Diagnosis not present

## 2015-11-03 DIAGNOSIS — Z992 Dependence on renal dialysis: Secondary | ICD-10-CM | POA: Diagnosis not present

## 2015-11-03 DIAGNOSIS — T82898A Other specified complication of vascular prosthetic devices, implants and grafts, initial encounter: Secondary | ICD-10-CM | POA: Diagnosis not present

## 2015-11-03 DIAGNOSIS — N186 End stage renal disease: Secondary | ICD-10-CM | POA: Diagnosis not present

## 2015-11-03 DIAGNOSIS — N2581 Secondary hyperparathyroidism of renal origin: Secondary | ICD-10-CM | POA: Diagnosis not present

## 2015-11-03 DIAGNOSIS — G5601 Carpal tunnel syndrome, right upper limb: Secondary | ICD-10-CM | POA: Diagnosis not present

## 2015-11-04 DIAGNOSIS — N2581 Secondary hyperparathyroidism of renal origin: Secondary | ICD-10-CM | POA: Diagnosis not present

## 2015-11-04 DIAGNOSIS — T82898A Other specified complication of vascular prosthetic devices, implants and grafts, initial encounter: Secondary | ICD-10-CM | POA: Diagnosis not present

## 2015-11-04 DIAGNOSIS — N186 End stage renal disease: Secondary | ICD-10-CM | POA: Diagnosis not present

## 2015-11-04 DIAGNOSIS — D509 Iron deficiency anemia, unspecified: Secondary | ICD-10-CM | POA: Diagnosis not present

## 2015-11-04 DIAGNOSIS — Z992 Dependence on renal dialysis: Secondary | ICD-10-CM | POA: Diagnosis not present

## 2015-11-06 DIAGNOSIS — T82898A Other specified complication of vascular prosthetic devices, implants and grafts, initial encounter: Secondary | ICD-10-CM | POA: Diagnosis not present

## 2015-11-06 DIAGNOSIS — Z992 Dependence on renal dialysis: Secondary | ICD-10-CM | POA: Diagnosis not present

## 2015-11-06 DIAGNOSIS — N186 End stage renal disease: Secondary | ICD-10-CM | POA: Diagnosis not present

## 2015-11-06 DIAGNOSIS — D509 Iron deficiency anemia, unspecified: Secondary | ICD-10-CM | POA: Diagnosis not present

## 2015-11-06 DIAGNOSIS — N2581 Secondary hyperparathyroidism of renal origin: Secondary | ICD-10-CM | POA: Diagnosis not present

## 2015-11-07 DIAGNOSIS — E1142 Type 2 diabetes mellitus with diabetic polyneuropathy: Secondary | ICD-10-CM | POA: Diagnosis not present

## 2015-11-07 DIAGNOSIS — L97522 Non-pressure chronic ulcer of other part of left foot with fat layer exposed: Secondary | ICD-10-CM | POA: Diagnosis not present

## 2015-11-07 DIAGNOSIS — L97511 Non-pressure chronic ulcer of other part of right foot limited to breakdown of skin: Secondary | ICD-10-CM | POA: Diagnosis not present

## 2015-11-08 ENCOUNTER — Encounter (HOSPITAL_COMMUNITY): Payer: Self-pay | Admitting: Emergency Medicine

## 2015-11-08 ENCOUNTER — Emergency Department (HOSPITAL_COMMUNITY)
Admission: EM | Admit: 2015-11-08 | Discharge: 2015-11-08 | Disposition: A | Discharge status: Home / Self Care | Payer: Medicare Other | Attending: Emergency Medicine | Admitting: Emergency Medicine

## 2015-11-08 ENCOUNTER — Emergency Department (HOSPITAL_COMMUNITY): Payer: Medicare Other

## 2015-11-08 DIAGNOSIS — J069 Acute upper respiratory infection, unspecified: Secondary | ICD-10-CM | POA: Diagnosis not present

## 2015-11-08 DIAGNOSIS — M7989 Other specified soft tissue disorders: Secondary | ICD-10-CM | POA: Insufficient documentation

## 2015-11-08 DIAGNOSIS — R05 Cough: Secondary | ICD-10-CM | POA: Diagnosis not present

## 2015-11-08 DIAGNOSIS — Z87891 Personal history of nicotine dependence: Secondary | ICD-10-CM | POA: Diagnosis not present

## 2015-11-08 DIAGNOSIS — N186 End stage renal disease: Secondary | ICD-10-CM | POA: Diagnosis not present

## 2015-11-08 DIAGNOSIS — Z8673 Personal history of transient ischemic attack (TIA), and cerebral infarction without residual deficits: Secondary | ICD-10-CM | POA: Diagnosis not present

## 2015-11-08 DIAGNOSIS — Z992 Dependence on renal dialysis: Secondary | ICD-10-CM | POA: Insufficient documentation

## 2015-11-08 DIAGNOSIS — T82898A Other specified complication of vascular prosthetic devices, implants and grafts, initial encounter: Secondary | ICD-10-CM | POA: Diagnosis not present

## 2015-11-08 DIAGNOSIS — I120 Hypertensive chronic kidney disease with stage 5 chronic kidney disease or end stage renal disease: Secondary | ICD-10-CM | POA: Insufficient documentation

## 2015-11-08 DIAGNOSIS — N2581 Secondary hyperparathyroidism of renal origin: Secondary | ICD-10-CM | POA: Diagnosis not present

## 2015-11-08 DIAGNOSIS — Z79899 Other long term (current) drug therapy: Secondary | ICD-10-CM | POA: Diagnosis not present

## 2015-11-08 DIAGNOSIS — D509 Iron deficiency anemia, unspecified: Secondary | ICD-10-CM | POA: Diagnosis not present

## 2015-11-08 MED ORDER — SALINE SPRAY 0.65 % NA SOLN
1.0000 | NASAL | Status: DC | PRN
  Administered 2015-11-08: 1 via NASAL
  Filled 2015-11-08: qty 44

## 2015-11-08 MED ORDER — LORATADINE 10 MG PO TABS: 10.0000 mg | ORAL_TABLET | ORAL | Status: DC | PRN

## 2015-11-08 NOTE — ED Notes (Signed)
PT states nasal congestion that resolved last week but then started having non-productive congested wet cough. PT states was sent over to ED for eval after his dialysis treatment today.

## 2015-11-08 NOTE — ED Provider Notes (Signed)
CSN: [id number]     Arrival date & time 11/08/15  1159 History  By signing my name below, I, [name], attest that this documentation has been prepared under the direction and in the presence of [name], MD. Electronically Signed: Rayna [name], ED Scribe. 11/08/2015. 12:28 PM.   Chief Complaint  Patient presents with  . Cough   The history is provided by the patient. No language interpreter was used.    HPI Comments: [name] is a 75 y.o. male with a PMHx of ESRD (MWF dialysis) who presents to the Emergency Department complaining of intermittent, moderate, non-productive cough x 1 week. Pt states he began experiencing cold like symptoms including cough and congestion last week which alleviated with time and then began once again following his dialysis treatment today. Pt states he went through 4.5 hours of dialysis this morning. Pt recently had surgery to his right hand for carpel tunnel. He denies fever, chills and leg swelling.   Past Medical History  Diagnosis Date  . Hypertension   . CVA (cerebral infarction)   . Gastric ulcer 2004    treated for h.pylori see PSH  . LVH (left ventricular hypertrophy)   . Dialysis patient ([place])   . Stroke [facility])     left side weakness  . Cardiomyopathy ([place])   . ESRD (end stage renal disease) on dialysis [facility])     M/W/F   Past Surgical History  Procedure Laterality Date  . Cholecystectomy    . Shoulder surgery    . Colonoscopy  2004    Dr. Irving Shows, left sided diverticula and cecal polyp, path unknown  . Esophagogastroduodenoscopy  11/2002    Dr. Gala Romney, erosive reflux esophagitis, multiple gastric ulcer and antral/bulbar erosions. Serologies positive for H.Pylori and was treated  . Esophagogastroduodenoscopy  11/20014    Dr. Gala Romney, small hh only, ulcers healed  . Esophagogastroduodenoscopy  09/21/2011    Dr Trevor Iha [facility], antral erosions, ?early GAVE  . Colonoscopy  10/29/2011    Procedure: COLONOSCOPY;  Surgeon: Daneil Dolin, MD;  [facility];  Service: Endoscopy;  Laterality: N/A;  10:15  . Arteriovenous graft placement Right right arm  . Abdominal aortagram N/A 01/24/2012    Procedure: ABDOMINAL Maxcine Ham;  Surgeon: Elam Dutch, MD;  Location: [facility] CATH LAB;  Service: Cardiovascular;  Laterality: N/A;  . Carpel tunnel Left Dec. 22, 2016  . Cardiac catheterization N/A 07/11/2015    Procedure: Left Heart Cath and Coronary Angiography;  Surgeon: Troy Sine, MD;  Location: [place] CV LAB;  Service: Cardiovascular;  Laterality: N/A;  . Av fistula placement Left 08/31/2015    Procedure: ARTERIOVENOUS (AV) FISTULA CREATION- LEFT RADIOCEPHALIC;  Surgeon: Mal Misty, MD;  Location: [facility] OR;  Service: Vascular;  Laterality: Left;   Family History  Problem Relation Age of Onset  . Colon cancer Neg Hx   . Liver disease Neg Hx   . Hypertension Mother    Social History  Substance Use Topics  . Smoking status: Former Smoker    Quit date: 04/29/2004  . Smokeless tobacco: Former Systems developer    Types: [place] date: 01/16/1987     Comment: quit 2006  . Alcohol Use: No    Review of Systems  Constitutional: Negative for fever and chills.  HENT: Positive for congestion.   Respiratory: Positive for cough.   Cardiovascular: Positive for leg swelling.  All other systems reviewed and are negative.   Allergies  Aspirin  Home Medications   Prior to Admission medications   Medication Sig Start Date End Date Taking? Authorizing Provider  acetaminophen (TYLENOL) 325 MG tablet Take 2 tablets (650 mg total) by mouth every 4 (four) hours as needed for headache or mild pain. 07/11/15   Erlene Quan, PA-C  ALPRAZolam Duanne Moron) 0.5 MG tablet Take 0.5 mg by mouth at bedtime as needed for anxiety.     Historical Provider, MD  amLODipine (NORVASC) 10 MG tablet Take 10 mg by mouth daily.    Historical Provider, MD  atorvastatin (LIPITOR) 40 MG tablet Take 1 tablet (40 mg total) by mouth daily at 6 PM.  07/12/15   Erlene Quan, PA-C  b complex-vitamin c-folic acid (NEPHRO-VITE) 0.8 MG TABS Take 0.8 mg by mouth as directed. Takes on Tuesdays, Thursdays, Saturdays, and Sundays. Does not take on Mondays, Wednesdays, and Fridays due to Dialysis treatments.    Historical Provider, MD  cinacalcet (SENSIPAR) 60 MG tablet Take 60 mg by mouth every evening. With evening meal    Historical Provider, MD  clopidogrel (PLAVIX) 75 MG tablet Take 75 mg by mouth daily. Reported on 05/30/2015 07/26/10   Historical Provider, MD  loratadine (CLARITIN) 10 MG tablet Take 1 tablet (10 mg total) by mouth every other day as needed for allergies. 11/08/15   [name], MD  meclizine (ANTIVERT) 25 MG tablet Take 25 mg by mouth daily.    Historical Provider, MD  metoprolol succinate (TOPROL-XL) 50 MG 24 hr tablet Take 50 mg by mouth daily. Reported on 05/30/2015    Historical Provider, MD  omeprazole (PRILOSEC) 20 MG capsule Take 20 mg by mouth daily. Reported on 05/30/2015 07/30/10   Historical Provider, MD  oxyCODONE-acetaminophen (PERCOCET/ROXICET) 5-325 MG tablet Take 1 tablet by mouth every 6 (six) hours as needed. 08/31/15   Ulyses Amor, PA-C  promethazine (PHENERGAN) 25 MG tablet Take 25 mg by mouth every 6 (six) hours as needed for nausea. Reported on 05/30/2015 07/22/11   Historical Provider, MD  sevelamer (RENVELA) 800 MG tablet Take 2,400-4,000 mg by mouth 3 (three) times daily with meals. Patient takes 5 tablets with meals and 3 tablets with snacks    Historical Provider, MD   BP 115/71 mmHg  Pulse 74  Temp(Src) 97.8 F (36.6 C) (Oral)  Resp 16  Ht 5\' 5"  (1.651 m)  Wt 180 lb (81.647 kg)  BMI 29.95 kg/m2  SpO2 100%    Physical Exam  Constitutional: He is oriented to person, place, and time. He appears well-developed and well-nourished.  HENT:  Head: Normocephalic and atraumatic.  Eyes: EOM are normal.  Neck: Normal range of motion.  Cardiovascular: Normal rate.   AV fistula in left forearm with good  thrill   Pulmonary/Chest: Effort normal. No respiratory distress.  Mild rhonchi noted bilaterally with auscultation   Abdominal: Soft.  Musculoskeletal: Normal range of motion.  Neurological: He is alert and oriented to person, place, and time.  Skin: Skin is warm and dry.  Well healing wound noted to right wrist from a recent carpel tunnel procedure.   Psychiatric: He has a normal mood and affect.  Nursing note and vitals reviewed.   ED Course  Procedures  DIAGNOSTIC STUDIES: Oxygen Saturation is 100% on RA, normal by my interpretation.    COORDINATION OF CARE: 12:20 PM Discussed next steps with pt. Pt verbalized understanding and is agreeable with the plan.   Labs Review Labs Reviewed - No data to display  Imaging Review  Dg Chest 2 View  11/08/2015  CLINICAL DATA:  Cough and congestion for 1 week with fevers EXAM: CHEST  2 VIEW COMPARISON:  08/08/2014 FINDINGS: Dialysis catheter is now seen in satisfactory position. Cardiac shadow is stable. Stable aortic calcifications are noted. Postsurgical changes in the right humerus are noted. The lungs are well-aerated without focal infiltrate or sizable effusion. No acute bony abnormality is noted. IMPRESSION: No acute abnormality noted. Aortic atherosclerosis. Electronically Signed   By: Inez Catalina M.D.   On: 11/08/2015 12:51   I have personally reviewed and evaluated these images and lab results as part of my medical decision-making.   EKG Interpretation None      MDM  Pt's CXR is negative.  He has been afebrile.  He does not have a cough.  I don't think he needs oral abx at this time.  We will treat him with saline nasal spray and clariting for his sx.  Pt knows to return if worse.  Final diagnoses:  URI (upper respiratory infection)  End-stage renal disease on hemodialysis ([place])    I personally performed the services described in this documentation, which was scribed in my presence. The recorded information has been reviewed  and is accurate.     [name], MD 11/08/15 1308

## 2015-11-08 NOTE — ED Notes (Signed)
EDP at bedside updating patient. 

## 2015-11-10 DIAGNOSIS — N186 End stage renal disease: Secondary | ICD-10-CM | POA: Diagnosis not present

## 2015-11-10 DIAGNOSIS — D509 Iron deficiency anemia, unspecified: Secondary | ICD-10-CM | POA: Diagnosis not present

## 2015-11-10 DIAGNOSIS — Z992 Dependence on renal dialysis: Secondary | ICD-10-CM | POA: Diagnosis not present

## 2015-11-10 DIAGNOSIS — T82898A Other specified complication of vascular prosthetic devices, implants and grafts, initial encounter: Secondary | ICD-10-CM | POA: Diagnosis not present

## 2015-11-10 DIAGNOSIS — N2581 Secondary hyperparathyroidism of renal origin: Secondary | ICD-10-CM | POA: Diagnosis not present

## 2015-11-13 DIAGNOSIS — N2581 Secondary hyperparathyroidism of renal origin: Secondary | ICD-10-CM | POA: Diagnosis not present

## 2015-11-13 DIAGNOSIS — D509 Iron deficiency anemia, unspecified: Secondary | ICD-10-CM | POA: Diagnosis not present

## 2015-11-13 DIAGNOSIS — Z992 Dependence on renal dialysis: Secondary | ICD-10-CM | POA: Diagnosis not present

## 2015-11-13 DIAGNOSIS — T82898A Other specified complication of vascular prosthetic devices, implants and grafts, initial encounter: Secondary | ICD-10-CM | POA: Diagnosis not present

## 2015-11-13 DIAGNOSIS — N186 End stage renal disease: Secondary | ICD-10-CM | POA: Diagnosis not present

## 2015-11-15 DIAGNOSIS — T82898A Other specified complication of vascular prosthetic devices, implants and grafts, initial encounter: Secondary | ICD-10-CM | POA: Diagnosis not present

## 2015-11-15 DIAGNOSIS — N186 End stage renal disease: Secondary | ICD-10-CM | POA: Diagnosis not present

## 2015-11-15 DIAGNOSIS — D509 Iron deficiency anemia, unspecified: Secondary | ICD-10-CM | POA: Diagnosis not present

## 2015-11-15 DIAGNOSIS — Z992 Dependence on renal dialysis: Secondary | ICD-10-CM | POA: Diagnosis not present

## 2015-11-15 DIAGNOSIS — N2581 Secondary hyperparathyroidism of renal origin: Secondary | ICD-10-CM | POA: Diagnosis not present

## 2015-11-17 DIAGNOSIS — T82898A Other specified complication of vascular prosthetic devices, implants and grafts, initial encounter: Secondary | ICD-10-CM | POA: Diagnosis not present

## 2015-11-17 DIAGNOSIS — N186 End stage renal disease: Secondary | ICD-10-CM | POA: Diagnosis not present

## 2015-11-17 DIAGNOSIS — D509 Iron deficiency anemia, unspecified: Secondary | ICD-10-CM | POA: Diagnosis not present

## 2015-11-17 DIAGNOSIS — N2581 Secondary hyperparathyroidism of renal origin: Secondary | ICD-10-CM | POA: Diagnosis not present

## 2015-11-17 DIAGNOSIS — Z992 Dependence on renal dialysis: Secondary | ICD-10-CM | POA: Diagnosis not present

## 2015-11-20 DIAGNOSIS — Z992 Dependence on renal dialysis: Secondary | ICD-10-CM | POA: Diagnosis not present

## 2015-11-20 DIAGNOSIS — T82898A Other specified complication of vascular prosthetic devices, implants and grafts, initial encounter: Secondary | ICD-10-CM | POA: Diagnosis not present

## 2015-11-20 DIAGNOSIS — D509 Iron deficiency anemia, unspecified: Secondary | ICD-10-CM | POA: Diagnosis not present

## 2015-11-20 DIAGNOSIS — N186 End stage renal disease: Secondary | ICD-10-CM | POA: Diagnosis not present

## 2015-11-20 DIAGNOSIS — N2581 Secondary hyperparathyroidism of renal origin: Secondary | ICD-10-CM | POA: Diagnosis not present

## 2015-11-22 DIAGNOSIS — D509 Iron deficiency anemia, unspecified: Secondary | ICD-10-CM | POA: Diagnosis not present

## 2015-11-22 DIAGNOSIS — N2581 Secondary hyperparathyroidism of renal origin: Secondary | ICD-10-CM | POA: Diagnosis not present

## 2015-11-22 DIAGNOSIS — T82898A Other specified complication of vascular prosthetic devices, implants and grafts, initial encounter: Secondary | ICD-10-CM | POA: Diagnosis not present

## 2015-11-22 DIAGNOSIS — Z992 Dependence on renal dialysis: Secondary | ICD-10-CM | POA: Diagnosis not present

## 2015-11-22 DIAGNOSIS — N186 End stage renal disease: Secondary | ICD-10-CM | POA: Diagnosis not present

## 2015-11-23 DIAGNOSIS — I5022 Chronic systolic (congestive) heart failure: Secondary | ICD-10-CM | POA: Diagnosis not present

## 2015-11-23 DIAGNOSIS — N186 End stage renal disease: Secondary | ICD-10-CM | POA: Diagnosis not present

## 2015-11-23 DIAGNOSIS — I2510 Atherosclerotic heart disease of native coronary artery without angina pectoris: Secondary | ICD-10-CM | POA: Diagnosis not present

## 2015-11-23 DIAGNOSIS — K219 Gastro-esophageal reflux disease without esophagitis: Secondary | ICD-10-CM | POA: Diagnosis not present

## 2015-11-24 DIAGNOSIS — N186 End stage renal disease: Secondary | ICD-10-CM | POA: Diagnosis not present

## 2015-11-24 DIAGNOSIS — T82898A Other specified complication of vascular prosthetic devices, implants and grafts, initial encounter: Secondary | ICD-10-CM | POA: Diagnosis not present

## 2015-11-24 DIAGNOSIS — Z992 Dependence on renal dialysis: Secondary | ICD-10-CM | POA: Diagnosis not present

## 2015-11-24 DIAGNOSIS — D509 Iron deficiency anemia, unspecified: Secondary | ICD-10-CM | POA: Diagnosis not present

## 2015-11-24 DIAGNOSIS — N2581 Secondary hyperparathyroidism of renal origin: Secondary | ICD-10-CM | POA: Diagnosis not present

## 2015-11-27 DIAGNOSIS — N186 End stage renal disease: Secondary | ICD-10-CM | POA: Diagnosis not present

## 2015-11-27 DIAGNOSIS — T82898A Other specified complication of vascular prosthetic devices, implants and grafts, initial encounter: Secondary | ICD-10-CM | POA: Diagnosis not present

## 2015-11-27 DIAGNOSIS — N2581 Secondary hyperparathyroidism of renal origin: Secondary | ICD-10-CM | POA: Diagnosis not present

## 2015-11-27 DIAGNOSIS — Z992 Dependence on renal dialysis: Secondary | ICD-10-CM | POA: Diagnosis not present

## 2015-11-27 DIAGNOSIS — D509 Iron deficiency anemia, unspecified: Secondary | ICD-10-CM | POA: Diagnosis not present

## 2015-11-28 DIAGNOSIS — E1142 Type 2 diabetes mellitus with diabetic polyneuropathy: Secondary | ICD-10-CM | POA: Diagnosis not present

## 2015-11-28 DIAGNOSIS — L851 Acquired keratosis [keratoderma] palmaris et plantaris: Secondary | ICD-10-CM | POA: Diagnosis not present

## 2015-11-28 DIAGNOSIS — B351 Tinea unguium: Secondary | ICD-10-CM | POA: Diagnosis not present

## 2015-11-29 DIAGNOSIS — Z992 Dependence on renal dialysis: Secondary | ICD-10-CM | POA: Diagnosis not present

## 2015-11-29 DIAGNOSIS — N2581 Secondary hyperparathyroidism of renal origin: Secondary | ICD-10-CM | POA: Diagnosis not present

## 2015-11-29 DIAGNOSIS — N186 End stage renal disease: Secondary | ICD-10-CM | POA: Diagnosis not present

## 2015-12-01 DIAGNOSIS — Z992 Dependence on renal dialysis: Secondary | ICD-10-CM | POA: Diagnosis not present

## 2015-12-01 DIAGNOSIS — N186 End stage renal disease: Secondary | ICD-10-CM | POA: Diagnosis not present

## 2015-12-01 DIAGNOSIS — N2581 Secondary hyperparathyroidism of renal origin: Secondary | ICD-10-CM | POA: Diagnosis not present

## 2015-12-04 DIAGNOSIS — Z992 Dependence on renal dialysis: Secondary | ICD-10-CM | POA: Diagnosis not present

## 2015-12-04 DIAGNOSIS — N2581 Secondary hyperparathyroidism of renal origin: Secondary | ICD-10-CM | POA: Diagnosis not present

## 2015-12-04 DIAGNOSIS — N186 End stage renal disease: Secondary | ICD-10-CM | POA: Diagnosis not present

## 2015-12-06 DIAGNOSIS — Z992 Dependence on renal dialysis: Secondary | ICD-10-CM | POA: Diagnosis not present

## 2015-12-06 DIAGNOSIS — N186 End stage renal disease: Secondary | ICD-10-CM | POA: Diagnosis not present

## 2015-12-06 DIAGNOSIS — N2581 Secondary hyperparathyroidism of renal origin: Secondary | ICD-10-CM | POA: Diagnosis not present

## 2015-12-08 DIAGNOSIS — Z992 Dependence on renal dialysis: Secondary | ICD-10-CM | POA: Diagnosis not present

## 2015-12-08 DIAGNOSIS — N186 End stage renal disease: Secondary | ICD-10-CM | POA: Diagnosis not present

## 2015-12-08 DIAGNOSIS — N2581 Secondary hyperparathyroidism of renal origin: Secondary | ICD-10-CM | POA: Diagnosis not present

## 2015-12-11 DIAGNOSIS — Z992 Dependence on renal dialysis: Secondary | ICD-10-CM | POA: Diagnosis not present

## 2015-12-11 DIAGNOSIS — N2581 Secondary hyperparathyroidism of renal origin: Secondary | ICD-10-CM | POA: Diagnosis not present

## 2015-12-11 DIAGNOSIS — N186 End stage renal disease: Secondary | ICD-10-CM | POA: Diagnosis not present

## 2015-12-12 DIAGNOSIS — E1142 Type 2 diabetes mellitus with diabetic polyneuropathy: Secondary | ICD-10-CM | POA: Diagnosis not present

## 2015-12-12 DIAGNOSIS — L97511 Non-pressure chronic ulcer of other part of right foot limited to breakdown of skin: Secondary | ICD-10-CM | POA: Diagnosis not present

## 2015-12-13 ENCOUNTER — Other Ambulatory Visit: Payer: Self-pay

## 2015-12-13 DIAGNOSIS — Z992 Dependence on renal dialysis: Secondary | ICD-10-CM | POA: Diagnosis not present

## 2015-12-13 DIAGNOSIS — N186 End stage renal disease: Secondary | ICD-10-CM | POA: Diagnosis not present

## 2015-12-13 DIAGNOSIS — N2581 Secondary hyperparathyroidism of renal origin: Secondary | ICD-10-CM | POA: Diagnosis not present

## 2015-12-13 NOTE — Progress Notes (Unsigned)
Received fax from [facility], on behalf of Dr. Lowanda Foster, requesting a fistulogram. Per note, patient has a knot on his arm and dialysis center is unable to cannulate AVF. Scheduled patient for left arm fistulogram tomorrow, 12/14/15, with Dr. Bridgett Larsson. Notified Mr. Gonsalez of procedure and provided instructions. Patient verbalized understanding. Spoke with Nira Conn at [facility] to inform of procedure; acknowledged.

## 2015-12-14 ENCOUNTER — Other Ambulatory Visit: Payer: Self-pay

## 2015-12-14 ENCOUNTER — Encounter (HOSPITAL_COMMUNITY): Payer: Self-pay | Admitting: *Deleted

## 2015-12-14 ENCOUNTER — Ambulatory Visit (HOSPITAL_COMMUNITY)
Admission: RE | Admit: 2015-12-14 | Discharge: 2015-12-14 | Disposition: A | Discharge status: Home / Self Care | Payer: Medicare Other | Source: Ambulatory Visit | Attending: Vascular Surgery | Admitting: Vascular Surgery

## 2015-12-14 ENCOUNTER — Encounter (HOSPITAL_COMMUNITY)
Admission: RE | Disposition: A | Discharge status: Home / Self Care | Payer: Self-pay | Source: Ambulatory Visit | Attending: Vascular Surgery

## 2015-12-14 DIAGNOSIS — Z8719 Personal history of other diseases of the digestive system: Secondary | ICD-10-CM | POA: Insufficient documentation

## 2015-12-14 DIAGNOSIS — Z87891 Personal history of nicotine dependence: Secondary | ICD-10-CM | POA: Diagnosis not present

## 2015-12-14 DIAGNOSIS — I120 Hypertensive chronic kidney disease with stage 5 chronic kidney disease or end stage renal disease: Secondary | ICD-10-CM | POA: Insufficient documentation

## 2015-12-14 DIAGNOSIS — Z8249 Family history of ischemic heart disease and other diseases of the circulatory system: Secondary | ICD-10-CM | POA: Diagnosis not present

## 2015-12-14 DIAGNOSIS — Y832 Surgical operation with anastomosis, bypass or graft as the cause of abnormal reaction of the patient, or of later complication, without mention of misadventure at the time of the procedure: Secondary | ICD-10-CM | POA: Diagnosis not present

## 2015-12-14 DIAGNOSIS — N186 End stage renal disease: Secondary | ICD-10-CM | POA: Diagnosis not present

## 2015-12-14 DIAGNOSIS — Z992 Dependence on renal dialysis: Secondary | ICD-10-CM | POA: Diagnosis not present

## 2015-12-14 DIAGNOSIS — T82510A Breakdown (mechanical) of surgically created arteriovenous fistula, initial encounter: Secondary | ICD-10-CM | POA: Insufficient documentation

## 2015-12-14 DIAGNOSIS — I69354 Hemiplegia and hemiparesis following cerebral infarction affecting left non-dominant side: Secondary | ICD-10-CM | POA: Insufficient documentation

## 2015-12-14 DIAGNOSIS — I429 Cardiomyopathy, unspecified: Secondary | ICD-10-CM | POA: Insufficient documentation

## 2015-12-14 DIAGNOSIS — T82898A Other specified complication of vascular prosthetic devices, implants and grafts, initial encounter: Secondary | ICD-10-CM | POA: Diagnosis not present

## 2015-12-14 HISTORY — PX: PERIPHERAL VASCULAR CATHETERIZATION: SHX172C

## 2015-12-14 LAB — POCT I-STAT, CHEM 8
BUN: 34 mg/dL — ABNORMAL HIGH (ref 6–20)
CALCIUM ION: 0.86 mmol/L — AB (ref 1.12–1.23)
CREATININE: 10.3 mg/dL — AB (ref 0.61–1.24)
Chloride: 104 mmol/L (ref 101–111)
GLUCOSE: 99 mg/dL (ref 65–99)
HCT: 54 % — ABNORMAL HIGH (ref 39.0–52.0)
HEMOGLOBIN: 18.4 g/dL — AB (ref 13.0–17.0)
POTASSIUM: 6.1 mmol/L — AB (ref 3.5–5.1)
Sodium: 131 mmol/L — ABNORMAL LOW (ref 135–145)
TCO2: 16 mmol/L (ref 0–100)

## 2015-12-14 SURGERY — A/V SHUNTOGRAM/FISTULAGRAM

## 2015-12-14 MED ORDER — HEPARIN (PORCINE) IN NACL 2-0.9 UNIT/ML-% IJ SOLN
INTRAMUSCULAR | Status: DC | PRN
  Administered 2015-12-14: 500 mL

## 2015-12-14 MED ORDER — IODIXANOL 320 MG/ML IV SOLN
INTRAVENOUS | Status: DC | PRN
  Administered 2015-12-14: 35 mL via INTRAVENOUS

## 2015-12-14 MED ORDER — SODIUM CHLORIDE 0.9% FLUSH: 3.0000 mL | Freq: Two times a day (BID) | INTRAVENOUS | Status: DC

## 2015-12-14 MED ORDER — SODIUM CHLORIDE 0.9 % IV SOLN: 250.0000 mL | INTRAVENOUS | Status: DC | PRN

## 2015-12-14 MED ORDER — LIDOCAINE HCL (PF) 1 % IJ SOLN
INTRAMUSCULAR | Status: DC | PRN
  Administered 2015-12-14: 3 mL via SUBCUTANEOUS

## 2015-12-14 MED ORDER — ACETAMINOPHEN 325 MG PO TABS: 650.0000 mg | ORAL_TABLET | ORAL | Status: DC | PRN

## 2015-12-14 MED ORDER — SODIUM CHLORIDE 0.9% FLUSH
3.0000 mL | INTRAVENOUS | Status: DC | PRN
Start: 2015-12-14 — End: 2015-12-14

## 2015-12-14 MED ORDER — HEPARIN (PORCINE) IN NACL 2-0.9 UNIT/ML-% IJ SOLN
INTRAMUSCULAR | Status: AC
  Filled 2015-12-14: qty 500

## 2015-12-14 MED ORDER — LIDOCAINE HCL (PF) 1 % IJ SOLN
INTRAMUSCULAR | Status: AC
  Filled 2015-12-14: qty 30

## 2015-12-14 MED ORDER — SODIUM CHLORIDE 0.9% FLUSH: 3.0000 mL | INTRAVENOUS | Status: DC | PRN

## 2015-12-14 SURGICAL SUPPLY — 11 items
BAG SNAP BAND KOVER 36X36 (MISCELLANEOUS) ×3 IMPLANT
COVER DOME SNAP 22 D (MISCELLANEOUS) ×3 IMPLANT
COVER PRB 48X5XTLSCP FOLD TPE (BAG) ×1 IMPLANT
COVER PROBE 5X48 (BAG) ×3
KIT MICROINTRODUCER STIFF 5F (SHEATH) ×2 IMPLANT
PROTECTION STATION PRESSURIZED (MISCELLANEOUS) ×3
STATION PROTECTION PRESSURIZED (MISCELLANEOUS) ×1 IMPLANT
STOPCOCK MORSE 400PSI 3WAY (MISCELLANEOUS) ×3 IMPLANT
TRAY PV CATH (CUSTOM PROCEDURE TRAY) ×3 IMPLANT
TUBING CIL FLEX 10 FLL-RA (TUBING) ×3 IMPLANT
WIRE TORQFLEX AUST .018X40CM (WIRE) ×4 IMPLANT

## 2015-12-14 NOTE — Discharge Instructions (Signed)
Fistulogram, Care After °Refer to this sheet in the next few weeks. These instructions provide you with information on caring for yourself after your procedure. Your health care provider may also give you more specific instructions. Your treatment has been planned according to current medical practices, but problems sometimes occur. Call your health care provider if you have any problems or questions after your procedure. °WHAT TO EXPECT AFTER THE PROCEDURE °After your procedure, it is typical to have the following: °· A small amount of discomfort in the area where the catheters were placed. °· A small amount of bruising around the fistula. °· Sleepiness and fatigue. °HOME CARE INSTRUCTIONS °· Rest at home for the day following your procedure. °· Do not drive or operate heavy machinery while taking pain medicine. °· Take medicines only as directed by your health care provider. °· Do not take baths, swim, or use a hot tub until your health care provider approves. You may shower 24 hours after the procedure or as directed by your health care provider. °· There are many different ways to close and cover an incision, including stitches, skin glue, and adhesive strips. Follow your health care provider's instructions on: °¨ Incision care. °¨ Bandage (dressing) changes and removal. °¨ Incision closure removal. °· Monitor your dialysis fistula carefully. °SEEK MEDICAL CARE IF: °· You have drainage, redness, swelling, or pain at your catheter site. °· You have a fever. °· You have chills. °SEEK IMMEDIATE MEDICAL CARE IF: °· You feel weak. °· You have trouble balancing. °· You have trouble moving your arms or legs. °· You have problems with your speech or vision. °· You can no longer feel a vibration or buzz when you put your fingers over your dialysis fistula. °· The limb that was used for the procedure: °¨ Swells. °¨ Is painful. °¨ Is cold. °¨ Is discolored, such as blue or pale white. °  °This information is not intended  to replace advice given to you by your health care provider. Make sure you discuss any questions you have with your health care provider. °  °Document Released: 08/30/2013 Document Reviewed: 08/30/2013 °Elsevier Interactive Patient Education ©2016 Elsevier Inc. ° °

## 2015-12-14 NOTE — Op Note (Signed)
    OPERATIVE NOTE   PROCEDURE: 1. left radiocephalic arteriovenous fistula cannulation under ultrasound guidance 2. left arm fistuloogram  PRE-OPERATIVE DIAGNOSIS: Malfunctioning left radiocephalic arteriovenous fistula  POST-OPERATIVE DIAGNOSIS: same as above   SURGEON: Adele Barthel, MD  ANESTHESIA: local  ESTIMATED BLOOD LOSS: 5 cc  FINDING(S): 1. Small patent non-mature fistula only ~4 mm lumen, draining primarily into basilic and brachial vein 2. Patent basilic and brachial veins 3. Diseased small upper arm cephalic vein 4. Patent central venous system 5. Left internal jugular vein tunneled dialysis catheter in place  SPECIMEN(S):  None  CONTRAST: 35 cc  INDICATIONS: [name] is a 75 y.o. male who  presents with malfunctioning left radiocephalic arteriovenous fistula.  The patient is scheduled for left arm fistulogram.  The patient is aware the risks include but are not limited to: bleeding, infection, thrombosis of the cannulated access, and possible anaphylactic reaction to the contrast.  The patient is aware of the risks of the procedure and elects to proceed forward.  DESCRIPTION: After full informed written consent was obtained, the patient was brought back to the angiography suite and placed supine upon the angiography table.  The patient was connected to monitoring equipment.  The left forearm was prepped and draped in the standard fashion for a left arm fistulogram.  Under ultrasound guidance, the left radiocephalic arteriovenous fistula was cannulated with a micropuncture needle.  The microwire was advanced into the fistula and the needle was exchanged for the a microsheath, which was lodged 2 cm into the access.  The wire was removed and the sheath was connected to the IV extension tubing.  Hand injections were completed to image the access from the antecubitum up to the level of axilla.  The central venous structures were also imaged by hand injections.  I also  obtained reflux images by compressing the outflow with a blood pressure cuff.  Based on the images, this patient will need: ligation of left radiocephalic arteriovenous fistula and basilic vein transposition vs brachial vein transposition.  A 4-0 Monocryl purse-string suture was sewn around the sheath.  The sheath was removed while tying down the suture.  A sterile bandage was applied to the puncture site.   COMPLICATIONS: none  CONDITION: stable   Adele Barthel, MD [facility] Office: [phone] Pager: [phone]  12/14/2015 10:38 AM

## 2015-12-14 NOTE — H&P (Signed)
Brief History and Physical  History of Present Illness  [name] is a 75 y.o. male who presents with chief complaint: non-functional L RC AVF.  The fistula has infilitrated on two prior attempts.  The patient continues to dialyze via a LIJV TDC.  The patient presents today for L arm fistulogram, possible intervention.  This patient denies any steal sx..    Past Medical History:  Diagnosis Date  . Cardiomyopathy ([place])   . CVA (cerebral infarction)   . Dialysis patient ([place])   . ESRD (end stage renal disease) on dialysis ([employer])    M/W/F  . Gastric ulcer 2004   treated for h.pylori see PSH  . Hypertension   . LVH (left ventricular hypertrophy)   . Stroke [facility])    left side weakness    Past Surgical History:  Procedure Laterality Date  . ABDOMINAL AORTAGRAM N/A 01/24/2012   Procedure: ABDOMINAL Maxcine Ham;  Surgeon: Elam Dutch, MD;  Location: [facility] CATH LAB;  Service: Cardiovascular;  Laterality: N/A;  . ARTERIOVENOUS GRAFT PLACEMENT Right right arm  . AV FISTULA PLACEMENT Left 08/31/2015   Procedure: ARTERIOVENOUS (AV) FISTULA CREATION- LEFT RADIOCEPHALIC;  Surgeon: Mal Misty, MD;  Location: [place];  Service: Vascular;  Laterality: Left;  . CARDIAC CATHETERIZATION N/A 07/11/2015   Procedure: Left Heart Cath and Coronary Angiography;  Surgeon: Troy Sine, MD;  Location: [place] CV LAB;  Service: Cardiovascular;  Laterality: N/A;  . Carpel Tunnel Left Dec. 22, 2016  . CHOLECYSTECTOMY    . COLONOSCOPY  2004   Dr. Irving Shows, left sided diverticula and cecal polyp, path unknown  . COLONOSCOPY  10/29/2011   Procedure: COLONOSCOPY;  Surgeon: Daneil Dolin, MD;  [facility];  Service: Endoscopy;  Laterality: N/A;  10:15  . ESOPHAGOGASTRODUODENOSCOPY  11/2002   Dr. Gala Romney, erosive reflux esophagitis, multiple gastric ulcer and antral/bulbar erosions. Serologies positive for H.Pylori and was treated  . ESOPHAGOGASTRODUODENOSCOPY  11/20014   Dr. Gala Romney, small hh  only, ulcers healed  . ESOPHAGOGASTRODUODENOSCOPY  09/21/2011   Dr Trevor Iha [facility], antral erosions, ?early GAVE  . SHOULDER SURGERY      Social History   Social History  . Marital status: Single    Spouse name: N/A  . Number of children: 2  . Years of education: N/A   Occupational History  . retired, [employer] Retired   Social History Main Topics  . Smoking status: Former Smoker    Quit date: 04/29/2004  . Smokeless tobacco: Former Systems developer    Types: [place] date: 01/16/1987     Comment: quit 2006  . Alcohol use No  . Drug use: No  . Sexual activity: Not on file   Other Topics Concern  . Not on file   Social History Narrative   Lives alone   Daughter 20-min away   Caffeine use: 32oz soda per day    Family History  Problem Relation Age of Onset  . Hypertension Mother   . Colon cancer Neg Hx   . Liver disease Neg Hx     No current facility-administered medications on file prior to encounter.    Current Outpatient Prescriptions on File Prior to Encounter  Medication Sig Dispense Refill  . acetaminophen (TYLENOL) 325 MG tablet Take 2 tablets (650 mg total) by mouth every 4 (four) hours as needed for headache or mild pain.    Marland Kitchen amLODipine (NORVASC) 10 MG tablet Take 10 mg by  mouth daily.    Marland Kitchen b complex-vitamin c-folic acid (NEPHRO-VITE) 0.8 MG TABS Take 0.8 mg by mouth as directed. Takes on Tuesdays, Thursdays, Saturdays, and Sundays. Does not take on Mondays, Wednesdays, and Fridays due to Dialysis treatments.    . cinacalcet (SENSIPAR) 60 MG tablet Take 60 mg by mouth every evening. With evening meal    . loratadine (CLARITIN) 10 MG tablet Take 1 tablet (10 mg total) by mouth every other day as needed for allergies. 30 tablet 0  . meclizine (ANTIVERT) 25 MG tablet Take 25 mg by mouth daily.    . sevelamer (RENVELA) 800 MG tablet Take 2,400-4,000 mg by mouth 3 (three) times daily with meals. Patient takes 5 tablets with meals and 3 tablets with snacks      Allergies   Allergen Reactions  . Aspirin Other (See Comments)    Causes internal bleeding     Review of Systems: no steal sx, no successful HD run via L RC AVF   Physical Examination  Vitals:   12/14/15 0552  BP: 134/69  Pulse: 91  Resp: 18  Temp: 97.8 F (36.6 C)  TempSrc: Oral  SpO2: 100%  Weight: 182 lb (82.6 kg)  Height: 5\' 5"  (1.651 m)    General: A&O x 3, WDWN  Pulmonary: Sym exp, good air movt, CTAB, no rales, rhonchi, & wheezing  Cardiac: RRR, Nl S1, S2, no Murmurs, rubs or gallops  Gastrointestinal: soft, NTND, -G/R, - HSM, - masses, - CVAT B  Musculoskeletal: M/S 5/5 throughout , Extremities without ischemic changes, old hematoma in forearm, palpable faint thrill, +bruit  Laboratory See [place] is a 75 y.o. male who presents with: non-maturing L RC AVF.   The patient is scheduled for: L arm fistulogram, possible intervention I discussed with the patient the nature of angiographic procedures, especially the limited patencies of any endovascular intervention.  The patient is aware of that the risks of an angiographic procedure include but are not limited to: bleeding, infection, access site complications, renal failure, embolization, rupture of vessel, dissection, possible need for emergent surgical intervention, possible need for surgical procedures to treat the patient's pathology, and stroke and death.    The patient is aware of the risks and agrees to proceed.  Adele Barthel, MD [facility] [place] Office: [phone] Pager: [phone]  12/14/2015, 9:17 AM

## 2015-12-15 DIAGNOSIS — Z992 Dependence on renal dialysis: Secondary | ICD-10-CM | POA: Diagnosis not present

## 2015-12-15 DIAGNOSIS — N2581 Secondary hyperparathyroidism of renal origin: Secondary | ICD-10-CM | POA: Diagnosis not present

## 2015-12-15 DIAGNOSIS — N186 End stage renal disease: Secondary | ICD-10-CM | POA: Diagnosis not present

## 2015-12-18 ENCOUNTER — Encounter (HOSPITAL_COMMUNITY): Payer: Self-pay | Admitting: *Deleted

## 2015-12-18 DIAGNOSIS — Z992 Dependence on renal dialysis: Secondary | ICD-10-CM | POA: Diagnosis not present

## 2015-12-18 DIAGNOSIS — N2581 Secondary hyperparathyroidism of renal origin: Secondary | ICD-10-CM | POA: Diagnosis not present

## 2015-12-18 DIAGNOSIS — N186 End stage renal disease: Secondary | ICD-10-CM | POA: Diagnosis not present

## 2015-12-18 NOTE — Progress Notes (Signed)
Spoke with pt for pre-op call. Pt denies cardiac history, chest pain or sob. 

## 2015-12-19 ENCOUNTER — Ambulatory Visit (HOSPITAL_COMMUNITY): Payer: Medicare Other | Admitting: Anesthesiology

## 2015-12-19 ENCOUNTER — Encounter (HOSPITAL_COMMUNITY): Payer: Self-pay | Admitting: *Deleted

## 2015-12-19 ENCOUNTER — Ambulatory Visit (HOSPITAL_COMMUNITY)
Admission: RE | Admit: 2015-12-19 | Discharge: 2015-12-19 | Disposition: A | Discharge status: Home / Self Care | Payer: Medicare Other | Source: Ambulatory Visit | Attending: Vascular Surgery | Admitting: Vascular Surgery

## 2015-12-19 ENCOUNTER — Encounter (HOSPITAL_COMMUNITY)
Admission: RE | Disposition: A | Discharge status: Home / Self Care | Payer: Self-pay | Source: Ambulatory Visit | Attending: Vascular Surgery

## 2015-12-19 DIAGNOSIS — Z87891 Personal history of nicotine dependence: Secondary | ICD-10-CM | POA: Diagnosis not present

## 2015-12-19 DIAGNOSIS — Z79899 Other long term (current) drug therapy: Secondary | ICD-10-CM | POA: Diagnosis not present

## 2015-12-19 DIAGNOSIS — N186 End stage renal disease: Secondary | ICD-10-CM | POA: Insufficient documentation

## 2015-12-19 DIAGNOSIS — I2510 Atherosclerotic heart disease of native coronary artery without angina pectoris: Secondary | ICD-10-CM | POA: Diagnosis not present

## 2015-12-19 DIAGNOSIS — T82510A Breakdown (mechanical) of surgically created arteriovenous fistula, initial encounter: Secondary | ICD-10-CM | POA: Diagnosis not present

## 2015-12-19 DIAGNOSIS — Y832 Surgical operation with anastomosis, bypass or graft as the cause of abnormal reaction of the patient, or of later complication, without mention of misadventure at the time of the procedure: Secondary | ICD-10-CM | POA: Insufficient documentation

## 2015-12-19 DIAGNOSIS — I120 Hypertensive chronic kidney disease with stage 5 chronic kidney disease or end stage renal disease: Secondary | ICD-10-CM | POA: Diagnosis not present

## 2015-12-19 DIAGNOSIS — Z992 Dependence on renal dialysis: Secondary | ICD-10-CM | POA: Diagnosis not present

## 2015-12-19 DIAGNOSIS — T82590A Other mechanical complication of surgically created arteriovenous fistula, initial encounter: Secondary | ICD-10-CM | POA: Diagnosis not present

## 2015-12-19 DIAGNOSIS — M1990 Unspecified osteoarthritis, unspecified site: Secondary | ICD-10-CM | POA: Diagnosis not present

## 2015-12-19 DIAGNOSIS — I429 Cardiomyopathy, unspecified: Secondary | ICD-10-CM | POA: Insufficient documentation

## 2015-12-19 DIAGNOSIS — I69354 Hemiplegia and hemiparesis following cerebral infarction affecting left non-dominant side: Secondary | ICD-10-CM | POA: Diagnosis not present

## 2015-12-19 DIAGNOSIS — N185 Chronic kidney disease, stage 5: Secondary | ICD-10-CM | POA: Diagnosis not present

## 2015-12-19 DIAGNOSIS — T82898A Other specified complication of vascular prosthetic devices, implants and grafts, initial encounter: Secondary | ICD-10-CM | POA: Diagnosis not present

## 2015-12-19 HISTORY — DX: Unspecified osteoarthritis, unspecified site: M19.90

## 2015-12-19 HISTORY — PX: BASCILIC VEIN TRANSPOSITION: SHX5742

## 2015-12-19 HISTORY — PX: LIGATION OF ARTERIOVENOUS  FISTULA: SHX5948

## 2015-12-19 LAB — POCT I-STAT 4, (NA,K, GLUC, HGB,HCT)
GLUCOSE: 83 mg/dL (ref 65–99)
HEMATOCRIT: 49 % (ref 39.0–52.0)
HEMOGLOBIN: 16.7 g/dL (ref 13.0–17.0)
POTASSIUM: 4.6 mmol/L (ref 3.5–5.1)
Sodium: 134 mmol/L — ABNORMAL LOW (ref 135–145)

## 2015-12-19 SURGERY — LIGATION OF ARTERIOVENOUS  FISTULA
Anesthesia: Monitor Anesthesia Care | Site: Arm Upper | Laterality: Left

## 2015-12-19 MED ORDER — PHENYLEPHRINE 40 MCG/ML (10ML) SYRINGE FOR IV PUSH (FOR BLOOD PRESSURE SUPPORT)
PREFILLED_SYRINGE | INTRAVENOUS | Status: AC
  Filled 2015-12-19: qty 20

## 2015-12-19 MED ORDER — GLYCOPYRROLATE 0.2 MG/ML IV SOSY
PREFILLED_SYRINGE | INTRAVENOUS | Status: AC
  Filled 2015-12-19: qty 3

## 2015-12-19 MED ORDER — METOPROLOL TARTRATE 5 MG/5ML IV SOLN
INTRAVENOUS | Status: AC
  Filled 2015-12-19: qty 5

## 2015-12-19 MED ORDER — GLYCOPYRROLATE 0.2 MG/ML IJ SOLN
INTRAMUSCULAR | Status: DC | PRN
  Administered 2015-12-19: 0.2 mg via INTRAVENOUS

## 2015-12-19 MED ORDER — PROPOFOL 500 MG/50ML IV EMUL
INTRAVENOUS | Status: DC | PRN
  Administered 2015-12-19: 13:00:00 via INTRAVENOUS
  Administered 2015-12-19: 25 ug/kg/min via INTRAVENOUS

## 2015-12-19 MED ORDER — EPHEDRINE SULFATE 50 MG/ML IJ SOLN
INTRAMUSCULAR | Status: DC | PRN
Start: 2015-12-19 — End: 2015-12-19
  Administered 2015-12-19: 5 mg via INTRAVENOUS

## 2015-12-19 MED ORDER — OXYCODONE-ACETAMINOPHEN 5-325 MG PO TABS: 1.0000 | ORAL_TABLET | Freq: Four times a day (QID) | ORAL | 0 refills | Status: DC | PRN

## 2015-12-19 MED ORDER — FENTANYL CITRATE (PF) 100 MCG/2ML IJ SOLN
INTRAMUSCULAR | Status: AC
Start: 2015-12-19 — End: 2015-12-19
  Filled 2015-12-19: qty 2

## 2015-12-19 MED ORDER — PHENYLEPHRINE HCL 10 MG/ML IJ SOLN
INTRAMUSCULAR | Status: DC | PRN
  Administered 2015-12-19: 120 ug via INTRAVENOUS
  Administered 2015-12-19: 160 ug via INTRAVENOUS
  Administered 2015-12-19 (×3): 120 ug via INTRAVENOUS
  Administered 2015-12-19: 160 ug via INTRAVENOUS

## 2015-12-19 MED ORDER — PHENYLEPHRINE HCL 10 MG/ML IJ SOLN
INTRAVENOUS | Status: DC | PRN
  Administered 2015-12-19: 50 ug/min via INTRAVENOUS

## 2015-12-19 MED ORDER — SODIUM CHLORIDE 0.9 % IV SOLN
INTRAVENOUS | Status: DC | PRN
  Administered 2015-12-19: 12:00:00

## 2015-12-19 MED ORDER — HYDROMORPHONE HCL 1 MG/ML IJ SOLN
0.2500 mg | INTRAMUSCULAR | Status: DC | PRN
  Administered 2015-12-19 (×2): 0.25 mg via INTRAVENOUS

## 2015-12-19 MED ORDER — PROPOFOL 10 MG/ML IV BOLUS
INTRAVENOUS | Status: AC
  Filled 2015-12-19: qty 60

## 2015-12-19 MED ORDER — ONDANSETRON HCL 4 MG/2ML IJ SOLN
INTRAMUSCULAR | Status: AC
  Filled 2015-12-19: qty 2

## 2015-12-19 MED ORDER — PROPOFOL 10 MG/ML IV BOLUS
INTRAVENOUS | Status: DC | PRN
  Administered 2015-12-19: 50 mg via INTRAVENOUS
  Administered 2015-12-19: 20 mg via INTRAVENOUS
  Administered 2015-12-19: 30 mg via INTRAVENOUS

## 2015-12-19 MED ORDER — PROPOFOL 10 MG/ML IV BOLUS
INTRAVENOUS | Status: AC
  Filled 2015-12-19: qty 40

## 2015-12-19 MED ORDER — DEXTROSE 5 % IV SOLN
1.5000 g | INTRAVENOUS | Status: AC
  Administered 2015-12-19: 1.5 g via INTRAVENOUS

## 2015-12-19 MED ORDER — LIDOCAINE 2% (20 MG/ML) 5 ML SYRINGE
INTRAMUSCULAR | Status: AC
  Filled 2015-12-19: qty 5

## 2015-12-19 MED ORDER — 0.9 % SODIUM CHLORIDE (POUR BTL) OPTIME
TOPICAL | Status: DC | PRN
  Administered 2015-12-19: 1000 mL

## 2015-12-19 MED ORDER — ONDANSETRON HCL 4 MG/2ML IJ SOLN
INTRAMUSCULAR | Status: DC | PRN
  Administered 2015-12-19: 4 mg via INTRAVENOUS

## 2015-12-19 MED ORDER — DEXTROSE 5 % IV SOLN
INTRAVENOUS | Status: AC
  Filled 2015-12-19: qty 1.5

## 2015-12-19 MED ORDER — HYDROMORPHONE HCL 1 MG/ML IJ SOLN
INTRAMUSCULAR | Status: AC
  Filled 2015-12-19: qty 1

## 2015-12-19 MED ORDER — FENTANYL CITRATE (PF) 100 MCG/2ML IJ SOLN
INTRAMUSCULAR | Status: DC | PRN
  Administered 2015-12-19: 25 ug via INTRAVENOUS
  Administered 2015-12-19: 50 ug via INTRAVENOUS
  Administered 2015-12-19: 25 ug via INTRAVENOUS

## 2015-12-19 MED ORDER — EPHEDRINE 5 MG/ML INJ
INTRAVENOUS | Status: AC
  Filled 2015-12-19: qty 10

## 2015-12-19 MED ORDER — SODIUM CHLORIDE 0.9 % IV SOLN: INTRAVENOUS | Status: DC

## 2015-12-19 MED ORDER — LIDOCAINE HCL (PF) 1 % IJ SOLN
INTRAMUSCULAR | Status: AC
  Filled 2015-12-19: qty 30

## 2015-12-19 MED ORDER — OXYCODONE HCL 5 MG PO TABS
5.0000 mg | ORAL_TABLET | Freq: Once | ORAL | Status: AC
  Administered 2015-12-19: 5 mg via ORAL

## 2015-12-19 MED ORDER — OXYCODONE HCL 5 MG PO TABS
ORAL_TABLET | ORAL | Status: AC
  Filled 2015-12-19: qty 1

## 2015-12-19 MED ORDER — LIDOCAINE HCL (CARDIAC) 20 MG/ML IV SOLN
INTRAVENOUS | Status: DC | PRN
  Administered 2015-12-19: 60 mg via INTRATRACHEAL

## 2015-12-19 MED ORDER — LIDOCAINE HCL (PF) 1 % IJ SOLN
INTRAMUSCULAR | Status: DC | PRN
  Administered 2015-12-19: 30 mL

## 2015-12-19 MED ORDER — CHLORHEXIDINE GLUCONATE CLOTH 2 % EX PADS: 6.0000 | MEDICATED_PAD | Freq: Once | CUTANEOUS | Status: DC

## 2015-12-19 MED ORDER — SODIUM CHLORIDE 0.9 % IV SOLN
INTRAVENOUS | Status: DC
  Administered 2015-12-19: 09:00:00 via INTRAVENOUS

## 2015-12-19 MED ORDER — METOPROLOL TARTARATE 1 MG/ML SYRINGE (5ML)
Status: DC | PRN
  Administered 2015-12-19 (×2): 1 mg via INTRAVENOUS

## 2015-12-19 SURGICAL SUPPLY — 47 items
AGENT HMST SPONGE THK3/8 (HEMOSTASIS)
ARMBAND PINK RESTRICT EXTREMIT (MISCELLANEOUS) ×4 IMPLANT
CANISTER SUCTION 2500CC (MISCELLANEOUS) ×4 IMPLANT
CLIP TI MEDIUM 24 (CLIP) ×4 IMPLANT
CLIP TI WIDE RED SMALL 24 (CLIP) ×4 IMPLANT
CORDS BIPOLAR (ELECTRODE) IMPLANT
COVER PROBE W GEL 5X96 (DRAPES) ×4 IMPLANT
DECANTER SPIKE VIAL GLASS SM (MISCELLANEOUS) ×4 IMPLANT
ELECT REM PT RETURN 9FT ADLT (ELECTROSURGICAL) ×4
ELECTRODE REM PT RTRN 9FT ADLT (ELECTROSURGICAL) ×2 IMPLANT
GAUZE SPONGE 4X4 12PLY STRL (GAUZE/BANDAGES/DRESSINGS) ×4 IMPLANT
GEL ULTRASOUND 20GR AQUASONIC (MISCELLANEOUS) IMPLANT
GLOVE BIO SURGEON STRL SZ7 (GLOVE) ×4 IMPLANT
GLOVE BIO SURGEON STRL SZ8 (GLOVE) ×2 IMPLANT
GLOVE BIOGEL PI IND STRL 6.5 (GLOVE) IMPLANT
GLOVE BIOGEL PI IND STRL 7.0 (GLOVE) IMPLANT
GLOVE BIOGEL PI IND STRL 7.5 (GLOVE) ×2 IMPLANT
GLOVE BIOGEL PI INDICATOR 6.5 (GLOVE) ×2
GLOVE BIOGEL PI INDICATOR 7.0 (GLOVE) ×6
GLOVE BIOGEL PI INDICATOR 7.5 (GLOVE) ×4
GLOVE ECLIPSE 6.5 STRL STRAW (GLOVE) ×2 IMPLANT
GLOVE ECLIPSE 7.0 STRL STRAW (GLOVE) ×2 IMPLANT
GLOVE SURG SS PI 6.5 STRL IVOR (GLOVE) ×4 IMPLANT
GOWN STRL REUS W/ TWL LRG LVL3 (GOWN DISPOSABLE) ×6 IMPLANT
GOWN STRL REUS W/TWL LRG LVL3 (GOWN DISPOSABLE) ×20
HEMOSTAT SPONGE AVITENE ULTRA (HEMOSTASIS) IMPLANT
KIT BASIN OR (CUSTOM PROCEDURE TRAY) ×4 IMPLANT
KIT ROOM TURNOVER OR (KITS) ×4 IMPLANT
LIQUID BAND (GAUZE/BANDAGES/DRESSINGS) ×6 IMPLANT
NS IRRIG 1000ML POUR BTL (IV SOLUTION) ×4 IMPLANT
PACK CV ACCESS (CUSTOM PROCEDURE TRAY) ×4 IMPLANT
PAD ARMBOARD 7.5X6 YLW CONV (MISCELLANEOUS) ×8 IMPLANT
SPONGE SURGIFOAM ABS GEL 100 (HEMOSTASIS) IMPLANT
SUT ETHILON 3 0 PS 1 (SUTURE) IMPLANT
SUT MNCRL AB 4-0 PS2 18 (SUTURE) ×8 IMPLANT
SUT PROLENE 6 0 BV (SUTURE) ×6 IMPLANT
SUT PROLENE 7 0 BV 1 (SUTURE) IMPLANT
SUT SILK 0 TIES 10X30 (SUTURE) ×4 IMPLANT
SUT SILK 2 0 SH (SUTURE) ×4 IMPLANT
SUT VIC AB 2-0 CT1 27 (SUTURE) ×4
SUT VIC AB 2-0 CT1 TAPERPNT 27 (SUTURE) ×2 IMPLANT
SUT VIC AB 3-0 SH 27 (SUTURE) ×8
SUT VIC AB 3-0 SH 27X BRD (SUTURE) ×4 IMPLANT
SWAB COLLECTION DEVICE MRSA (MISCELLANEOUS) IMPLANT
TUBE ANAEROBIC SPECIMEN COL (MISCELLANEOUS) IMPLANT
UNDERPAD 30X30 INCONTINENT (UNDERPADS AND DIAPERS) ×4 IMPLANT
WATER STERILE IRR 1000ML POUR (IV SOLUTION) ×4 IMPLANT

## 2015-12-19 NOTE — Progress Notes (Signed)
Dialysis access record faxed to [place] [facility] 

## 2015-12-19 NOTE — Interval H&P Note (Signed)
[facility] [place]  History and Physical Update  The patient was interviewed and re-examined.  The patient's previous History and Physical has been reviewed and is unchanged from my consult except for: interval fistulogram.  Based on the fistulogram, I left the previous L RC AVF was likely never going to mature.  The upper arm cephalic vein was unsuitable for use for a brachiocephalic arteriovenous fistula.  I recommended ligation of the left RC AVF and placement of a first stage BVT.     Risk, benefits, and alternatives to access surgery were discussed.    The patient is aware the risks include but are not limited to: bleeding, infection, steal syndrome, nerve damage, ischemic monomelic neuropathy, failure to mature, need for additional procedures, death and stroke.    The patient agrees to proceed forward with the procedure.  Adele Barthel, MD [facility] [place] Office: [phone] Pager: [phone]  12/19/2015, 11:05 AM

## 2015-12-19 NOTE — Anesthesia Postprocedure Evaluation (Signed)
Anesthesia Post Note  Patient: [name]  Procedure(s) Performed: Procedure(s) (LRB): LIGATION OF RADIOCEPHALIC ARTERIOVENOUS  FISTULA (Left) FIRST STAGE BRACHIAL VEIN TRANSPOSITION (Left)  Patient location during evaluation: PACU Anesthesia Type: MAC Level of consciousness: awake and alert Pain management: pain level controlled Vital Signs Assessment: post-procedure vital signs reviewed and stable Respiratory status: spontaneous breathing Cardiovascular status: stable Anesthetic complications: no    Last Vitals:  Vitals:   12/19/15 1445 12/19/15 1448  BP: 106/61 118/70  Pulse: 83   Resp: 17   Temp: 36.1 C     Last Pain:  Vitals:   12/19/15 1445  TempSrc:   PainSc: [place]

## 2015-12-19 NOTE — Transfer of Care (Signed)
Immediate Anesthesia Transfer of Care Note  Patient: [name]  Procedure(s) Performed: Procedure(s): LIGATION OF RADIOCEPHALIC ARTERIOVENOUS  FISTULA (Left) FIRST STAGE BRACHIAL VEIN TRANSPOSITION (Left)  Patient Location: PACU  Anesthesia Type:MAC  Level of Consciousness: awake, alert , oriented and patient cooperative  Airway & Oxygen Therapy: Patient Spontanous Breathing and Patient connected to nasal cannula oxygen  Post-op Assessment: Report given to RN, Post -op Vital signs reviewed and stable, Patient moving all extremities X 4 and Patient able to stick tongue midline  Post vital signs: Reviewed and stable  Last Vitals:  Vitals:   12/19/15 0753  BP: 127/74  Pulse: 81  Resp: 20  Temp: 36.4 C    Last Pain:  Vitals:   12/19/15 0753  TempSrc: Oral         Complications: No apparent anesthesia complications

## 2015-12-19 NOTE — Progress Notes (Signed)
Report given to maryann shaver rn as caregiver 

## 2015-12-19 NOTE — H&P (View-Only) (Signed)
Brief History and Physical  History of Present Illness  [name] is a 75 y.o. male who presents with chief complaint: non-functional L RC AVF.  The fistula has infilitrated on two prior attempts.  The patient continues to dialyze via a LIJV TDC.  The patient presents today for L arm fistulogram, possible intervention.  This patient denies any steal sx..    Past Medical History:  Diagnosis Date  . Cardiomyopathy ([place])   . CVA (cerebral infarction)   . Dialysis patient ([place])   . ESRD (end stage renal disease) on dialysis ([place])    M/W/F  . Gastric ulcer 2004   treated for h.pylori see PSH  . Hypertension   . LVH (left ventricular hypertrophy)   . Stroke [facility])    left side weakness    Past Surgical History:  Procedure Laterality Date  . ABDOMINAL AORTAGRAM N/A 01/24/2012   Procedure: ABDOMINAL Maxcine Ham;  Surgeon: Elam Dutch, MD;  Location: [facility] CATH LAB;  Service: Cardiovascular;  Laterality: N/A;  . ARTERIOVENOUS GRAFT PLACEMENT Right right arm  . AV FISTULA PLACEMENT Left 08/31/2015   Procedure: ARTERIOVENOUS (AV) FISTULA CREATION- LEFT RADIOCEPHALIC;  Surgeon: Mal Misty, MD;  Location: [place];  Service: Vascular;  Laterality: Left;  . CARDIAC CATHETERIZATION N/A 07/11/2015   Procedure: Left Heart Cath and Coronary Angiography;  Surgeon: Troy Sine, MD;  Location: [place] CV LAB;  Service: Cardiovascular;  Laterality: N/A;  . Carpel Tunnel Left Dec. 22, 2016  . CHOLECYSTECTOMY    . COLONOSCOPY  2004   Dr. Irving Shows, left sided diverticula and cecal polyp, path unknown  . COLONOSCOPY  10/29/2011   Procedure: COLONOSCOPY;  Surgeon: Daneil Dolin, MD;  [facility];  Service: Endoscopy;  Laterality: N/A;  10:15  . ESOPHAGOGASTRODUODENOSCOPY  11/2002   Dr. Gala Romney, erosive reflux esophagitis, multiple gastric ulcer and antral/bulbar erosions. Serologies positive for H.Pylori and was treated  . ESOPHAGOGASTRODUODENOSCOPY  11/20014   Dr. Gala Romney, small hh  only, ulcers healed  . ESOPHAGOGASTRODUODENOSCOPY  09/21/2011   Dr Trevor Iha [facility], antral erosions, ?early GAVE  . SHOULDER SURGERY      Social History   Social History  . Marital status: Single    Spouse name: N/A  . Number of children: 2  . Years of education: N/A   Occupational History  . retired, [employer] Retired   Social History Main Topics  . Smoking status: Former Smoker    Quit date: 04/29/2004  . Smokeless tobacco: Former Systems developer    Types: [place] date: 01/16/1987     Comment: quit 2006  . Alcohol use No  . Drug use: No  . Sexual activity: Not on file   Other Topics Concern  . Not on file   Social History Narrative   Lives alone   Daughter 20-min away   Caffeine use: 32oz soda per day    Family History  Problem Relation Age of Onset  . Hypertension Mother   . Colon cancer Neg Hx   . Liver disease Neg Hx     No current facility-administered medications on file prior to encounter.    Current Outpatient Prescriptions on File Prior to Encounter  Medication Sig Dispense Refill  . acetaminophen (TYLENOL) 325 MG tablet Take 2 tablets (650 mg total) by mouth every 4 (four) hours as needed for headache or mild pain.    Marland Kitchen amLODipine (NORVASC) 10 MG tablet Take 10 mg by  mouth daily.    Marland Kitchen b complex-vitamin c-folic acid (NEPHRO-VITE) 0.8 MG TABS Take 0.8 mg by mouth as directed. Takes on Tuesdays, Thursdays, Saturdays, and Sundays. Does not take on Mondays, Wednesdays, and Fridays due to Dialysis treatments.    . cinacalcet (SENSIPAR) 60 MG tablet Take 60 mg by mouth every evening. With evening meal    . loratadine (CLARITIN) 10 MG tablet Take 1 tablet (10 mg total) by mouth every other day as needed for allergies. 30 tablet 0  . meclizine (ANTIVERT) 25 MG tablet Take 25 mg by mouth daily.    . sevelamer (RENVELA) 800 MG tablet Take 2,400-4,000 mg by mouth 3 (three) times daily with meals. Patient takes 5 tablets with meals and 3 tablets with snacks      Allergies   Allergen Reactions  . Aspirin Other (See Comments)    Causes internal bleeding     Review of Systems: no steal sx, no successful HD run via L RC AVF   Physical Examination  Vitals:   12/14/15 0552  BP: 134/69  Pulse: 91  Resp: 18  Temp: 97.8 F (36.6 C)  TempSrc: Oral  SpO2: 100%  Weight: 182 lb (82.6 kg)  Height: 5\' 5"  (1.651 m)    General: A&O x 3, WDWN  Pulmonary: Sym exp, good air movt, CTAB, no rales, rhonchi, & wheezing  Cardiac: RRR, Nl S1, S2, no Murmurs, rubs or gallops  Gastrointestinal: soft, NTND, -G/R, - HSM, - masses, - CVAT B  Musculoskeletal: M/S 5/5 throughout , Extremities without ischemic changes, old hematoma in forearm, palpable faint thrill, +bruit  Laboratory See [place] is a 75 y.o. male who presents with: non-maturing L RC AVF.   The patient is scheduled for: L arm fistulogram, possible intervention I discussed with the patient the nature of angiographic procedures, especially the limited patencies of any endovascular intervention.  The patient is aware of that the risks of an angiographic procedure include but are not limited to: bleeding, infection, access site complications, renal failure, embolization, rupture of vessel, dissection, possible need for emergent surgical intervention, possible need for surgical procedures to treat the patient's pathology, and stroke and death.    The patient is aware of the risks and agrees to proceed.  Adele Barthel, MD [facility] [place] Office: [phone] Pager: [phone]  12/14/2015, 9:17 AM

## 2015-12-19 NOTE — Anesthesia Preprocedure Evaluation (Signed)
Anesthesia Evaluation  Patient identified by MRN, date of birth, ID band Patient awake    Reviewed: Allergy & Precautions, NPO status , Patient's Chart, lab work & pertinent test results  History of Anesthesia Complications Negative for: history of anesthetic complications  Airway Mallampati: II  TM Distance: >3 FB     Dental  (+) Edentulous Upper, Edentulous Lower   Pulmonary former smoker,    Pulmonary exam normal breath sounds clear to auscultation       Cardiovascular hypertension, + CAD and + Peripheral Vascular Disease   Rhythm:Regular Rate:Normal     Neuro/Psych Left leg weakness CVA    GI/Hepatic PUD,   Endo/Other    Renal/GU ESRFRenal disease     Musculoskeletal  (+) Arthritis ,   Abdominal Normal abdominal exam  (+)   Peds  Hematology   Anesthesia Other Findings   Reproductive/Obstetrics                             Anesthesia Physical  Anesthesia Plan  ASA: IV  Anesthesia Plan: MAC   Post-op Pain Management:    Induction: Intravenous  Airway Management Planned: Mask  Additional Equipment:   Intra-op Plan:   Post-operative Plan:   Informed Consent: I have reviewed the patients History and Physical, chart, labs and discussed the procedure including the risks, benefits and alternatives for the proposed anesthesia with the patient or authorized representative who has indicated his/her understanding and acceptance.   Dental advisory given  Plan Discussed with: CRNA  Anesthesia Plan Comments:         Anesthesia Quick Evaluation

## 2015-12-19 NOTE — Op Note (Signed)
OPERATIVE NOTE   PROCEDURE: 1. left first stage brachial vein transposition (brachiobrachial arteriovenous fistula) placement Ligation of left radiocephalic arteriovenous fistula   PRE-OPERATIVE DIAGNOSIS: end stage renal disease-HD, non-maturing R RC AVF  POST-OPERATIVE DIAGNOSIS: same as above   SURGEON: Adele Barthel, MD  ASSISTANT(S): Gerri Lins, PAC   ANESTHESIA: local and MAC  ESTIMATED BLOOD LOSS: 50 cc  FINDING(S): 1.  Likely significant atherosclerosis in radial and ulnar arteries given lack of pulses after ligation of radiocephalic arteriovenous fistula  2.  Faint doppler signals in radial artery at end of case 3.  Severe scarring in antecubitum: would NOT use antecubital artery for any further inflow for an access procedure 4.  Basilic vein only 2 mm in diameter 5.  Brachial vein 3-4 mm in diameter.   6.  Brachial artery: 4-5 mm in diameter 6.  Strong thrill in fistula at end of case  SPECIMEN(S):  none  INDICATIONS:   [name] is a 75 y.o. male who presents with end stage renal disease-HD.  His left radiocephalic arteriovenous fistula has never been usable.  His fistulogram demonstrated a short small fistula.  I recommended: ligation of the left radiocephalic arteriovenous fistula and placement of either a first stage basilic vs.  brachial vein transposition.  The patient is aware the risks include but are not limited to: bleeding, infection, steal syndrome, nerve damage, ischemic monomelic neuropathy, failure to mature, and need for additional procedures.  The patient is aware of the risks of the procedure and elects to proceed forward.   DESCRIPTION: After full informed written consent was obtained from the patient, the patient was brought back to the operating room and placed supine upon the operating table.  Prior to induction, the patient received IV antibiotics.   After obtaining adequate anesthesia, the patient was then prepped and draped in the  standard fashion for a left arm access procedure.  I identified the distal radiocephalic arteriovenous fistula under Sonosite guidance.  I made an incision over the fistula.  I dissected it out and then tied off the fistula distally with two 2-0 silk ties and then transected the fistula.  The incision was repaired with a 3-0 Vicryl in the subcutaneous tissue.  The skin was reapproximated with a running stitch of 4-0 Monocryl.    I turned my attention to the antecubitum and identified the patient's brachial vein and brachial artery.  Using SonoSite guidance, the location of these vessels were marked out on the skin.   At this point, I injected local anesthetic to obtain a field block of the antecubitum.  In total, I injected about 5 mL of 1% lidocaine without epinephrine.  I made a transverse incision at the level of the antecubitum and dissected through the subcutaneous tissue and fascia to gain exposure of the brachial artery.  This was noted to be 4-5 mm in diameter externally.  This was extremely difficult due to diffuse dense scar tissue throughout the antecubitum.  This artery was dissected out proximally and distally and controlled with vessel loops .  I then dissected out the basilic vein.  This vein only appeared to be 2 mm in diameter.  I then dissected out the brachial vein.  This was also extremely difficult due to dense scar tissue.  This vein was noted to be 3-4 mm in diameter externally.  The distal segment of the vein was ligated with a  2-0 silk, and the vein was transected.  The proximal segment was interrogated  with serial dilators.  The vein accepted up to a 4 mm dilator without any difficulty.  I then instilled the heparinized saline into the vein and clamped it.  At this point, I reset my exposure of the brachial artery and placed the artery under tension proximally and distally.  I made an arteriotomy with a #11 blade, and then I extended the arteriotomy with a Potts scissor.  I injected  heparinized saline proximal and distal to this arteriotomy.  The vein was then sewn to the artery in an end-to-side configuration with a running stitch of 6-0 Prolene.  Prior to completing this anastomosis, I allowed the vein and artery to backbleed.  There was no evidence of clot from any vessels.  I completed the anastomosis in the usual fashion and then released all vessel loops and clamps.  There was a strongly  palpable thrill in the venous outflow, and there was a weak dopplerable radial signal.  This was no different from after ligation of the radiocephalic fistula.  At this point, I irrigated out the surgical wound.  There was no further active bleeding.  The subcutaneous tissue was reapproximated with a running stitch of 3-0 Vicryl.  The skin was then reapproximated with a running subcuticular stitch of 4-0 Vicryl.  The skin was then cleaned, dried, and reinforced with Liquidband.  The same as completed on the left wrist.  The patient tolerated this procedure well.    COMPLICATIONS: none  CONDITION: stable   Adele Barthel, MD, [facility] [place] Office: [phone] Pager: [phone]  12/19/2015, 1:42 PM

## 2015-12-20 ENCOUNTER — Encounter (HOSPITAL_COMMUNITY): Payer: Self-pay | Admitting: Vascular Surgery

## 2015-12-20 DIAGNOSIS — Z992 Dependence on renal dialysis: Secondary | ICD-10-CM | POA: Diagnosis not present

## 2015-12-20 DIAGNOSIS — N2581 Secondary hyperparathyroidism of renal origin: Secondary | ICD-10-CM | POA: Diagnosis not present

## 2015-12-20 DIAGNOSIS — N186 End stage renal disease: Secondary | ICD-10-CM | POA: Diagnosis not present

## 2015-12-21 ENCOUNTER — Telehealth: Payer: Self-pay | Admitting: Vascular Surgery

## 2015-12-21 NOTE — Telephone Encounter (Signed)
-----   Message from Mena Goes, RN sent at 12/19/2015  2:18 PM EDT ----- Regarding: schedule 6 weeks post AVF    ----- Message ----- From: Conrad [place], MD Sent: 12/19/2015   1:55 PM To: Vvs Charge [address]  [name] [id number] 1940-12-02  PROCEDURE: left first stage brachial vein transposition (brachiobrachial arteriovenous fistula) placement Ligation of left radiocephalic arteriovenous fistula   Asst: Gerri Lins, [facility]   Follow-up: 6 weeks

## 2015-12-21 NOTE — Telephone Encounter (Signed)
Sched appt 10/16 at 10:45. Lm on cell# to inform pt.

## 2015-12-22 DIAGNOSIS — N2581 Secondary hyperparathyroidism of renal origin: Secondary | ICD-10-CM | POA: Diagnosis not present

## 2015-12-22 DIAGNOSIS — N186 End stage renal disease: Secondary | ICD-10-CM | POA: Diagnosis not present

## 2015-12-22 DIAGNOSIS — Z992 Dependence on renal dialysis: Secondary | ICD-10-CM | POA: Diagnosis not present

## 2015-12-25 DIAGNOSIS — N186 End stage renal disease: Secondary | ICD-10-CM | POA: Diagnosis not present

## 2015-12-25 DIAGNOSIS — N2581 Secondary hyperparathyroidism of renal origin: Secondary | ICD-10-CM | POA: Diagnosis not present

## 2015-12-25 DIAGNOSIS — Z992 Dependence on renal dialysis: Secondary | ICD-10-CM | POA: Diagnosis not present

## 2015-12-26 DIAGNOSIS — L97519 Non-pressure chronic ulcer of other part of right foot with unspecified severity: Secondary | ICD-10-CM | POA: Diagnosis not present

## 2015-12-26 DIAGNOSIS — G629 Polyneuropathy, unspecified: Secondary | ICD-10-CM | POA: Diagnosis not present

## 2015-12-27 DIAGNOSIS — N2581 Secondary hyperparathyroidism of renal origin: Secondary | ICD-10-CM | POA: Diagnosis not present

## 2015-12-27 DIAGNOSIS — I871 Compression of vein: Secondary | ICD-10-CM | POA: Diagnosis not present

## 2015-12-27 DIAGNOSIS — N186 End stage renal disease: Secondary | ICD-10-CM | POA: Diagnosis not present

## 2015-12-27 DIAGNOSIS — Z992 Dependence on renal dialysis: Secondary | ICD-10-CM | POA: Diagnosis not present

## 2015-12-27 DIAGNOSIS — T82858D Stenosis of vascular prosthetic devices, implants and grafts, subsequent encounter: Secondary | ICD-10-CM | POA: Diagnosis not present

## 2015-12-28 DIAGNOSIS — Z992 Dependence on renal dialysis: Secondary | ICD-10-CM | POA: Diagnosis not present

## 2015-12-28 DIAGNOSIS — N186 End stage renal disease: Secondary | ICD-10-CM | POA: Diagnosis not present

## 2015-12-29 DIAGNOSIS — N186 End stage renal disease: Secondary | ICD-10-CM | POA: Diagnosis not present

## 2015-12-29 DIAGNOSIS — N2581 Secondary hyperparathyroidism of renal origin: Secondary | ICD-10-CM | POA: Diagnosis not present

## 2015-12-29 DIAGNOSIS — Z992 Dependence on renal dialysis: Secondary | ICD-10-CM | POA: Diagnosis not present

## 2016-01-01 DIAGNOSIS — N186 End stage renal disease: Secondary | ICD-10-CM | POA: Diagnosis not present

## 2016-01-01 DIAGNOSIS — Z992 Dependence on renal dialysis: Secondary | ICD-10-CM | POA: Diagnosis not present

## 2016-01-01 DIAGNOSIS — N2581 Secondary hyperparathyroidism of renal origin: Secondary | ICD-10-CM | POA: Diagnosis not present

## 2016-01-03 DIAGNOSIS — N2581 Secondary hyperparathyroidism of renal origin: Secondary | ICD-10-CM | POA: Diagnosis not present

## 2016-01-03 DIAGNOSIS — Z992 Dependence on renal dialysis: Secondary | ICD-10-CM | POA: Diagnosis not present

## 2016-01-03 DIAGNOSIS — N186 End stage renal disease: Secondary | ICD-10-CM | POA: Diagnosis not present

## 2016-01-05 DIAGNOSIS — N186 End stage renal disease: Secondary | ICD-10-CM | POA: Diagnosis not present

## 2016-01-05 DIAGNOSIS — Z992 Dependence on renal dialysis: Secondary | ICD-10-CM | POA: Diagnosis not present

## 2016-01-05 DIAGNOSIS — N2581 Secondary hyperparathyroidism of renal origin: Secondary | ICD-10-CM | POA: Diagnosis not present

## 2016-01-07 DIAGNOSIS — Z992 Dependence on renal dialysis: Secondary | ICD-10-CM | POA: Diagnosis not present

## 2016-01-07 DIAGNOSIS — N2581 Secondary hyperparathyroidism of renal origin: Secondary | ICD-10-CM | POA: Diagnosis not present

## 2016-01-07 DIAGNOSIS — N186 End stage renal disease: Secondary | ICD-10-CM | POA: Diagnosis not present

## 2016-01-10 DIAGNOSIS — N186 End stage renal disease: Secondary | ICD-10-CM | POA: Diagnosis not present

## 2016-01-10 DIAGNOSIS — N2581 Secondary hyperparathyroidism of renal origin: Secondary | ICD-10-CM | POA: Diagnosis not present

## 2016-01-10 DIAGNOSIS — Z992 Dependence on renal dialysis: Secondary | ICD-10-CM | POA: Diagnosis not present

## 2016-01-12 DIAGNOSIS — N186 End stage renal disease: Secondary | ICD-10-CM | POA: Diagnosis not present

## 2016-01-12 DIAGNOSIS — N2581 Secondary hyperparathyroidism of renal origin: Secondary | ICD-10-CM | POA: Diagnosis not present

## 2016-01-12 DIAGNOSIS — Z992 Dependence on renal dialysis: Secondary | ICD-10-CM | POA: Diagnosis not present

## 2016-01-15 DIAGNOSIS — N2581 Secondary hyperparathyroidism of renal origin: Secondary | ICD-10-CM | POA: Diagnosis not present

## 2016-01-15 DIAGNOSIS — Z992 Dependence on renal dialysis: Secondary | ICD-10-CM | POA: Diagnosis not present

## 2016-01-15 DIAGNOSIS — N186 End stage renal disease: Secondary | ICD-10-CM | POA: Diagnosis not present

## 2016-01-16 DIAGNOSIS — L97512 Non-pressure chronic ulcer of other part of right foot with fat layer exposed: Secondary | ICD-10-CM | POA: Diagnosis not present

## 2016-01-16 DIAGNOSIS — G629 Polyneuropathy, unspecified: Secondary | ICD-10-CM | POA: Diagnosis not present

## 2016-01-17 DIAGNOSIS — N2581 Secondary hyperparathyroidism of renal origin: Secondary | ICD-10-CM | POA: Diagnosis not present

## 2016-01-17 DIAGNOSIS — Z992 Dependence on renal dialysis: Secondary | ICD-10-CM | POA: Diagnosis not present

## 2016-01-17 DIAGNOSIS — N186 End stage renal disease: Secondary | ICD-10-CM | POA: Diagnosis not present

## 2016-01-19 DIAGNOSIS — N186 End stage renal disease: Secondary | ICD-10-CM | POA: Diagnosis not present

## 2016-01-19 DIAGNOSIS — N2581 Secondary hyperparathyroidism of renal origin: Secondary | ICD-10-CM | POA: Diagnosis not present

## 2016-01-19 DIAGNOSIS — Z992 Dependence on renal dialysis: Secondary | ICD-10-CM | POA: Diagnosis not present

## 2016-01-22 DIAGNOSIS — Z992 Dependence on renal dialysis: Secondary | ICD-10-CM | POA: Diagnosis not present

## 2016-01-22 DIAGNOSIS — N186 End stage renal disease: Secondary | ICD-10-CM | POA: Diagnosis not present

## 2016-01-22 DIAGNOSIS — N2581 Secondary hyperparathyroidism of renal origin: Secondary | ICD-10-CM | POA: Diagnosis not present

## 2016-01-24 DIAGNOSIS — N186 End stage renal disease: Secondary | ICD-10-CM | POA: Diagnosis not present

## 2016-01-24 DIAGNOSIS — N2581 Secondary hyperparathyroidism of renal origin: Secondary | ICD-10-CM | POA: Diagnosis not present

## 2016-01-24 DIAGNOSIS — Z992 Dependence on renal dialysis: Secondary | ICD-10-CM | POA: Diagnosis not present

## 2016-01-26 DIAGNOSIS — Z992 Dependence on renal dialysis: Secondary | ICD-10-CM | POA: Diagnosis not present

## 2016-01-26 DIAGNOSIS — N2581 Secondary hyperparathyroidism of renal origin: Secondary | ICD-10-CM | POA: Diagnosis not present

## 2016-01-26 DIAGNOSIS — N186 End stage renal disease: Secondary | ICD-10-CM | POA: Diagnosis not present

## 2016-01-27 DIAGNOSIS — Z992 Dependence on renal dialysis: Secondary | ICD-10-CM | POA: Diagnosis not present

## 2016-01-27 DIAGNOSIS — N186 End stage renal disease: Secondary | ICD-10-CM | POA: Diagnosis not present

## 2016-01-29 DIAGNOSIS — N2581 Secondary hyperparathyroidism of renal origin: Secondary | ICD-10-CM | POA: Diagnosis not present

## 2016-01-29 DIAGNOSIS — N186 End stage renal disease: Secondary | ICD-10-CM | POA: Diagnosis not present

## 2016-01-29 DIAGNOSIS — Z992 Dependence on renal dialysis: Secondary | ICD-10-CM | POA: Diagnosis not present

## 2016-01-29 DIAGNOSIS — D509 Iron deficiency anemia, unspecified: Secondary | ICD-10-CM | POA: Diagnosis not present

## 2016-01-29 NOTE — Progress Notes (Signed)
Postoperative Access Visit   History of Present Illness  [name] is a 75 y.o. year old male who presents for postoperative follow-up for: L 1st BRVT, ligation of L RC AVF (Date: 12/19/15).  The patient's wounds are healed.  The patient notes no steal symptoms.  The patient is able to complete their activities of daily living.  The patient's current symptoms are: none.  Past Medical History:  Diagnosis Date  . Arthritis   . Cardiomyopathy ([place])   . CVA (cerebral infarction)   . Dialysis patient ([place])   . ESRD (end stage renal disease) on dialysis ([place])    M/W/F  . Gastric ulcer 2004   treated for h.pylori see PSH  . Hypertension   . Low iron   . LVH (left ventricular hypertrophy)   . Stroke [facility])    left side weakness    Past Surgical History:  Procedure Laterality Date  . ABDOMINAL AORTAGRAM N/A 01/24/2012   Procedure: ABDOMINAL Maxcine Ham;  Surgeon: Elam Dutch, MD;  Location: [facility] CATH LAB;  Service: Cardiovascular;  Laterality: N/A;  . ARTERIOVENOUS GRAFT PLACEMENT Right right arm  . AV FISTULA PLACEMENT Left 08/31/2015   Procedure: ARTERIOVENOUS (AV) FISTULA CREATION- LEFT RADIOCEPHALIC;  Surgeon: Mal Misty, MD;  Location: [place];  Service: Vascular;  Laterality: Left;  . BASCILIC VEIN TRANSPOSITION Left 12/19/2015   Procedure: FIRST STAGE BRACHIAL VEIN TRANSPOSITION;  Surgeon: Conrad [place], MD;  Location: [place];  Service: Vascular;  Laterality: Left;  . CARDIAC CATHETERIZATION N/A 07/11/2015   Procedure: Left Heart Cath and Coronary Angiography;  Surgeon: Troy Sine, MD;  Location: [place] CV LAB;  Service: Cardiovascular;  Laterality: N/A;  . Carpel Tunnel Left Dec. 22, 2016  . CHOLECYSTECTOMY    . COLONOSCOPY  2004   Dr. Irving Shows, left sided diverticula and cecal polyp, path unknown  . COLONOSCOPY  10/29/2011   Procedure: COLONOSCOPY;  Surgeon: Daneil Dolin, MD;  [facility];  Service: Endoscopy;  Laterality: N/A;  10:15  .  ESOPHAGOGASTRODUODENOSCOPY  11/2002   Dr. Gala Romney, erosive reflux esophagitis, multiple gastric ulcer and antral/bulbar erosions. Serologies positive for H.Pylori and was treated  . ESOPHAGOGASTRODUODENOSCOPY  11/20014   Dr. Gala Romney, small hh only, ulcers healed  . ESOPHAGOGASTRODUODENOSCOPY  09/21/2011   Dr Trevor Iha [facility], antral erosions, ?early GAVE  . LIGATION OF ARTERIOVENOUS  FISTULA Left 12/19/2015   Procedure: LIGATION OF RADIOCEPHALIC ARTERIOVENOUS  FISTULA;  Surgeon: Conrad Roscoe, MD;  Location: [place];  Service: Vascular;  Laterality: Left;  . PERIPHERAL VASCULAR CATHETERIZATION N/A 12/14/2015   Procedure: Fistulagram;  Surgeon: Conrad Coeb[place], MD;  Location: [place] CV LAB;  Service: Cardiovascular;  Laterality: N/A;  . SHOULDER SURGERY      Social History   Social History  . Marital status: Single    Spouse name: N/A  . Number of children: 2  . Years of education: N/A   Occupational History  . retired, [employer] Retired   Social History Main Topics  . Smoking status: Former Smoker    Quit date: 04/29/2004  . Smokeless tobacco: Former Systems developer    Types: [place] date: 01/16/1987     Comment: quit 2006  . Alcohol use No  . Drug use: No  . Sexual activity: Not on file   Other Topics Concern  . Not on file   Social History Narrative   Lives alone   Daughter 20-min away  Caffeine use: 32oz soda per day    Family History  Problem Relation Age of Onset  . Hypertension Mother   . Colon cancer Neg Hx   . Liver disease Neg Hx     Current Outpatient Prescriptions  Medication Sig Dispense Refill  . ALPRAZolam (XANAX) 0.5 MG tablet Take 0.5 mg by mouth daily.  3  . amLODipine (NORVASC) 10 MG tablet Take 10 mg by mouth daily.    Marland Kitchen atorvastatin (LIPITOR) 40 MG tablet Take 40 mg by mouth daily.  3  . b complex-vitamin c-folic acid (NEPHRO-VITE) 0.8 MG TABS Take 0.8 mg by mouth as directed. Takes on Tuesdays, Thursdays, Saturdays, and Sundays. Does not take on Mondays,  Wednesdays, and Fridays due to Dialysis treatments.    . cinacalcet (SENSIPAR) 60 MG tablet Take 60 mg by mouth every evening. With evening meal    . loratadine (CLARITIN) 10 MG tablet Take 1 tablet (10 mg total) by mouth every other day as needed for allergies. 30 tablet 0  . meclizine (ANTIVERT) 25 MG tablet Take 25 mg by mouth daily.    Marland Kitchen oxyCODONE-acetaminophen (PERCOCET/ROXICET) 5-325 MG tablet Take 1 tablet by mouth every 6 (six) hours as needed. 6 tablet 0  . promethazine (PHENERGAN) 25 MG tablet Take 25 mg by mouth every other day.    . sevelamer (RENVELA) 800 MG tablet Take 2,400-4,000 mg by mouth 3 (three) times daily with meals. Patient takes 5 tablets with meals and 3 tablets with snacks     No current facility-administered medications for this visit.      Allergies  Allergen Reactions  . Aspirin Other (See Comments)    Causes internal bleeding      REVIEW OF SYSTEMS:  (Positives checked otherwise negative)  CARDIOVASCULAR:   [ ]  chest pain,  [ ]  chest pressure,  [ ]  palpitations,  [ ]  shortness of breath when laying flat,  [ ]  shortness of breath with exertion,   [ ]  pain in feet when walking,  [ ]  pain in feet when laying flat, [ ]  history of blood clot in veins (DVT),  [ ]  history of phlebitis,  [ ]  swelling in legs,  [ ]  varicose veins  PULMONARY:   [ ]  productive cough,  [ ]  asthma,  [ ]  wheezing  NEUROLOGIC:   [ ]  weakness in arms or legs,  [ ]  numbness in arms or legs,  [ ]  difficulty speaking or slurred speech,  [ ]  temporary loss of vision in one eye,  [ ]  dizziness  HEMATOLOGIC:   [ ]  bleeding problems,  [ ]  problems with blood clotting too easily  MUSCULOSKEL:   [ ]  joint pain, [ ]  joint swelling  GASTROINTEST:   [ ]  vomiting blood,  [ ]  blood in stool     GENITOURINARY:   [ ]  burning with urination,  [ ]  blood in urine [x]  end stage renal disease-HD: M/W/F   PSYCHIATRIC:   [ ]  history of major depression  INTEGUMENTARY:   [ ]   rashes,  [ ]  ulcers  CONSTITUTIONAL:   [ ]  fever,  [ ]  chills    Physical Examination Vitals:   02/02/16 1057  BP: 133/74  Pulse: 86  Resp: 16  Temp: 97.8 F (36.6 C)   Pulmonary: Sym exp, good air movt, CTAB, no rales, rhonchi, & wheezing  Cardiac: RRR, Nl S1, S2, no Murmurs, rubs or gallops  LUE: Incision is healed, skin feels warm, hand grip is 5/5,  sensation in digits is intact, palpable thrill, bruit can  be auscultated, on Sonosite: fistula >6 mm throughout   [place] is a 75 y.o. year old male who presents s/p L 1st BRVT, ligation of L RC AVF.   The patient's access is ready for transposition.  This is scheduled for this coming Thursday, 12 OCT 17. Risk, benefits, and alternatives to access surgery were discussed.   The patient is aware the risks include but are not limited to: bleeding, infection, steal syndrome, nerve damage, ischemic monomelic neuropathy, failure to mature, need for additional procedures, death and stroke.  The patient agrees to proceed forward with the procedure.  Thank you for allowing [place] to participate in this patient's care.  Adele Barthel, MD, FACS [facility] [place] Office: [phone] Pager: [phone]

## 2016-01-30 ENCOUNTER — Encounter: Payer: Self-pay | Admitting: Vascular Surgery

## 2016-01-30 DIAGNOSIS — L97519 Non-pressure chronic ulcer of other part of right foot with unspecified severity: Secondary | ICD-10-CM | POA: Diagnosis not present

## 2016-01-30 DIAGNOSIS — G629 Polyneuropathy, unspecified: Secondary | ICD-10-CM | POA: Diagnosis not present

## 2016-01-31 DIAGNOSIS — N186 End stage renal disease: Secondary | ICD-10-CM | POA: Diagnosis not present

## 2016-01-31 DIAGNOSIS — Z992 Dependence on renal dialysis: Secondary | ICD-10-CM | POA: Diagnosis not present

## 2016-01-31 DIAGNOSIS — N2581 Secondary hyperparathyroidism of renal origin: Secondary | ICD-10-CM | POA: Diagnosis not present

## 2016-01-31 DIAGNOSIS — D509 Iron deficiency anemia, unspecified: Secondary | ICD-10-CM | POA: Diagnosis not present

## 2016-02-02 ENCOUNTER — Ambulatory Visit (INDEPENDENT_AMBULATORY_CARE_PROVIDER_SITE_OTHER): Payer: Medicare Other | Admitting: Vascular Surgery

## 2016-02-02 ENCOUNTER — Encounter: Payer: Self-pay | Admitting: Vascular Surgery

## 2016-02-02 ENCOUNTER — Encounter: Payer: Medicare Other | Admitting: Vascular Surgery

## 2016-02-02 ENCOUNTER — Other Ambulatory Visit: Payer: Self-pay

## 2016-02-02 VITALS — BP 133/74 | HR 86 | Temp 97.8°F | Resp 16 | Ht 65.0 in | Wt 178.0 lb

## 2016-02-02 DIAGNOSIS — N186 End stage renal disease: Secondary | ICD-10-CM | POA: Diagnosis not present

## 2016-02-02 DIAGNOSIS — Z992 Dependence on renal dialysis: Secondary | ICD-10-CM

## 2016-02-02 DIAGNOSIS — N2581 Secondary hyperparathyroidism of renal origin: Secondary | ICD-10-CM | POA: Diagnosis not present

## 2016-02-02 DIAGNOSIS — D509 Iron deficiency anemia, unspecified: Secondary | ICD-10-CM | POA: Diagnosis not present

## 2016-02-05 DIAGNOSIS — E785 Hyperlipidemia, unspecified: Secondary | ICD-10-CM | POA: Diagnosis not present

## 2016-02-05 DIAGNOSIS — Z79899 Other long term (current) drug therapy: Secondary | ICD-10-CM | POA: Diagnosis not present

## 2016-02-05 DIAGNOSIS — N2581 Secondary hyperparathyroidism of renal origin: Secondary | ICD-10-CM | POA: Diagnosis not present

## 2016-02-05 DIAGNOSIS — N186 End stage renal disease: Secondary | ICD-10-CM | POA: Diagnosis not present

## 2016-02-05 DIAGNOSIS — Z992 Dependence on renal dialysis: Secondary | ICD-10-CM | POA: Diagnosis not present

## 2016-02-05 DIAGNOSIS — D509 Iron deficiency anemia, unspecified: Secondary | ICD-10-CM | POA: Diagnosis not present

## 2016-02-06 ENCOUNTER — Encounter (HOSPITAL_COMMUNITY): Payer: Self-pay | Admitting: *Deleted

## 2016-02-06 NOTE — Progress Notes (Signed)
Pt denies SOB, chest pain, and being under the care of a cardiologist. Pt made aware to stop taking vitamins, fish oil, herbal medications and NSAID's. Pt verbalized understanding of all pre-op instructions.

## 2016-02-07 DIAGNOSIS — N2581 Secondary hyperparathyroidism of renal origin: Secondary | ICD-10-CM | POA: Diagnosis not present

## 2016-02-07 DIAGNOSIS — Z992 Dependence on renal dialysis: Secondary | ICD-10-CM | POA: Diagnosis not present

## 2016-02-07 DIAGNOSIS — N186 End stage renal disease: Secondary | ICD-10-CM | POA: Diagnosis not present

## 2016-02-07 DIAGNOSIS — D509 Iron deficiency anemia, unspecified: Secondary | ICD-10-CM | POA: Diagnosis not present

## 2016-02-08 ENCOUNTER — Ambulatory Visit (HOSPITAL_COMMUNITY)
Admission: RE | Admit: 2016-02-08 | Discharge: 2016-02-08 | Disposition: A | Discharge status: Home / Self Care | Payer: Medicare Other | Source: Ambulatory Visit | Attending: Vascular Surgery | Admitting: Vascular Surgery

## 2016-02-08 ENCOUNTER — Ambulatory Visit (HOSPITAL_COMMUNITY): Payer: Medicare Other | Admitting: Anesthesiology

## 2016-02-08 ENCOUNTER — Encounter (HOSPITAL_COMMUNITY)
Admission: RE | Disposition: A | Discharge status: Home / Self Care | Payer: Self-pay | Source: Ambulatory Visit | Attending: Vascular Surgery

## 2016-02-08 ENCOUNTER — Encounter (HOSPITAL_COMMUNITY): Payer: Self-pay | Admitting: *Deleted

## 2016-02-08 DIAGNOSIS — I69354 Hemiplegia and hemiparesis following cerebral infarction affecting left non-dominant side: Secondary | ICD-10-CM | POA: Diagnosis not present

## 2016-02-08 DIAGNOSIS — M1990 Unspecified osteoarthritis, unspecified site: Secondary | ICD-10-CM | POA: Insufficient documentation

## 2016-02-08 DIAGNOSIS — I080 Rheumatic disorders of both mitral and aortic valves: Secondary | ICD-10-CM | POA: Diagnosis not present

## 2016-02-08 DIAGNOSIS — R011 Cardiac murmur, unspecified: Secondary | ICD-10-CM | POA: Diagnosis not present

## 2016-02-08 DIAGNOSIS — N185 Chronic kidney disease, stage 5: Secondary | ICD-10-CM | POA: Diagnosis not present

## 2016-02-08 DIAGNOSIS — Z8719 Personal history of other diseases of the digestive system: Secondary | ICD-10-CM | POA: Insufficient documentation

## 2016-02-08 DIAGNOSIS — Z8711 Personal history of peptic ulcer disease: Secondary | ICD-10-CM | POA: Diagnosis not present

## 2016-02-08 DIAGNOSIS — I120 Hypertensive chronic kidney disease with stage 5 chronic kidney disease or end stage renal disease: Secondary | ICD-10-CM | POA: Diagnosis not present

## 2016-02-08 DIAGNOSIS — Z886 Allergy status to analgesic agent status: Secondary | ICD-10-CM | POA: Diagnosis not present

## 2016-02-08 DIAGNOSIS — Z87891 Personal history of nicotine dependence: Secondary | ICD-10-CM | POA: Insufficient documentation

## 2016-02-08 DIAGNOSIS — Z992 Dependence on renal dialysis: Secondary | ICD-10-CM | POA: Insufficient documentation

## 2016-02-08 DIAGNOSIS — I2510 Atherosclerotic heart disease of native coronary artery without angina pectoris: Secondary | ICD-10-CM | POA: Insufficient documentation

## 2016-02-08 DIAGNOSIS — I429 Cardiomyopathy, unspecified: Secondary | ICD-10-CM | POA: Diagnosis not present

## 2016-02-08 DIAGNOSIS — N186 End stage renal disease: Secondary | ICD-10-CM | POA: Diagnosis not present

## 2016-02-08 HISTORY — PX: BASCILIC VEIN TRANSPOSITION: SHX5742

## 2016-02-08 LAB — POCT I-STAT 4, (NA,K, GLUC, HGB,HCT)
Glucose, Bld: 92 mg/dL (ref 65–99)
HCT: 55 % — ABNORMAL HIGH (ref 39.0–52.0)
HEMOGLOBIN: 18.7 g/dL — AB (ref 13.0–17.0)
POTASSIUM: 4.2 mmol/L (ref 3.5–5.1)
Sodium: 135 mmol/L (ref 135–145)

## 2016-02-08 SURGERY — TRANSPOSITION, VEIN, BASILIC
Anesthesia: General | Site: Arm Upper | Laterality: Left

## 2016-02-08 MED ORDER — FENTANYL CITRATE (PF) 100 MCG/2ML IJ SOLN
INTRAMUSCULAR | Status: AC
  Filled 2016-02-08: qty 2

## 2016-02-08 MED ORDER — PROPOFOL 10 MG/ML IV BOLUS
INTRAVENOUS | Status: DC | PRN
  Administered 2016-02-08: 30 mg via INTRAVENOUS
  Administered 2016-02-08: 150 mg via INTRAVENOUS
  Administered 2016-02-08: 20 mg via INTRAVENOUS

## 2016-02-08 MED ORDER — FENTANYL CITRATE (PF) 100 MCG/2ML IJ SOLN
INTRAMUSCULAR | Status: DC | PRN
  Administered 2016-02-08 (×4): 25 ug via INTRAVENOUS

## 2016-02-08 MED ORDER — ONDANSETRON HCL 4 MG/2ML IJ SOLN
INTRAMUSCULAR | Status: DC | PRN
  Administered 2016-02-08: 4 mg via INTRAVENOUS

## 2016-02-08 MED ORDER — LIDOCAINE HCL (PF) 1 % IJ SOLN
INTRAMUSCULAR | Status: DC | PRN
  Administered 2016-02-08: 30 mL via INTRADERMAL

## 2016-02-08 MED ORDER — OXYCODONE-ACETAMINOPHEN 5-325 MG PO TABS: 1.0000 | ORAL_TABLET | Freq: Four times a day (QID) | ORAL | 0 refills | Status: DC | PRN

## 2016-02-08 MED ORDER — PHENYLEPHRINE HCL 10 MG/ML IJ SOLN
INTRAVENOUS | Status: DC | PRN
  Administered 2016-02-08: 25 ug/min via INTRAVENOUS

## 2016-02-08 MED ORDER — 0.9 % SODIUM CHLORIDE (POUR BTL) OPTIME
TOPICAL | Status: DC | PRN
  Administered 2016-02-08: 1000 mL

## 2016-02-08 MED ORDER — LIDOCAINE HCL (CARDIAC) 20 MG/ML IV SOLN
INTRAVENOUS | Status: DC | PRN
  Administered 2016-02-08: 60 mg via INTRAVENOUS

## 2016-02-08 MED ORDER — EPHEDRINE SULFATE-NACL 50-0.9 MG/10ML-% IV SOSY
PREFILLED_SYRINGE | INTRAVENOUS | Status: DC | PRN
  Administered 2016-02-08: 5 mg via INTRAVENOUS

## 2016-02-08 MED ORDER — CEFUROXIME SODIUM 1.5 G IJ SOLR
INTRAMUSCULAR | Status: AC
  Filled 2016-02-08: qty 1.5

## 2016-02-08 MED ORDER — SODIUM CHLORIDE 0.9 % IV SOLN
INTRAVENOUS | Status: DC
  Administered 2016-02-08: 35 mL/h via INTRAVENOUS

## 2016-02-08 MED ORDER — PHENYLEPHRINE 40 MCG/ML (10ML) SYRINGE FOR IV PUSH (FOR BLOOD PRESSURE SUPPORT)
PREFILLED_SYRINGE | INTRAVENOUS | Status: DC | PRN
  Administered 2016-02-08 (×2): 40 ug via INTRAVENOUS

## 2016-02-08 MED ORDER — HEMOSTATIC AGENTS (NO CHARGE) OPTIME
TOPICAL | Status: DC | PRN
  Administered 2016-02-08: 2 via TOPICAL

## 2016-02-08 MED ORDER — SODIUM CHLORIDE 0.9 % IV SOLN
INTRAVENOUS | Status: DC | PRN
  Administered 2016-02-08: 13:00:00

## 2016-02-08 MED ORDER — DEXTROSE 5 % IV SOLN
1.5000 g | INTRAVENOUS | Status: AC
  Administered 2016-02-08: 1.5 g via INTRAVENOUS

## 2016-02-08 MED ORDER — SODIUM CHLORIDE 0.9 % IV SOLN
INTRAVENOUS | Status: DC
Start: 2016-02-08 — End: 2016-02-08
  Administered 2016-02-08 (×2): via INTRAVENOUS

## 2016-02-08 MED ORDER — LIDOCAINE HCL (PF) 1 % IJ SOLN
INTRAMUSCULAR | Status: AC
  Filled 2016-02-08: qty 30

## 2016-02-08 MED ORDER — CHLORHEXIDINE GLUCONATE CLOTH 2 % EX PADS: 6.0000 | MEDICATED_PAD | Freq: Once | CUTANEOUS | Status: DC

## 2016-02-08 MED ORDER — FENTANYL CITRATE (PF) 100 MCG/2ML IJ SOLN
25.0000 ug | INTRAMUSCULAR | Status: DC | PRN
  Administered 2016-02-08: 25 ug via INTRAVENOUS
  Administered 2016-02-08: 50 ug via INTRAVENOUS
  Administered 2016-02-08: 25 ug via INTRAVENOUS

## 2016-02-08 SURGICAL SUPPLY — 48 items
ADH SKN CLS APL DERMABOND .7 (GAUZE/BANDAGES/DRESSINGS) ×1
AGENT HMST SPONGE THK3/8 (HEMOSTASIS) ×2
ARMBAND PINK RESTRICT EXTREMIT (MISCELLANEOUS) ×3 IMPLANT
CANISTER SUCTION 2500CC (MISCELLANEOUS) ×3 IMPLANT
CLIP TI MEDIUM 24 (CLIP) ×3 IMPLANT
CLIP TI WIDE RED SMALL 24 (CLIP) ×3 IMPLANT
CORDS BIPOLAR (ELECTRODE) IMPLANT
COVER PROBE W GEL 5X96 (DRAPES) ×2 IMPLANT
DECANTER SPIKE VIAL GLASS SM (MISCELLANEOUS) ×3 IMPLANT
DERMABOND ADVANCED (GAUZE/BANDAGES/DRESSINGS) ×2
DERMABOND ADVANCED .7 DNX12 (GAUZE/BANDAGES/DRESSINGS) IMPLANT
ELECT REM PT RETURN 9FT ADLT (ELECTROSURGICAL) ×3
ELECTRODE REM PT RTRN 9FT ADLT (ELECTROSURGICAL) ×1 IMPLANT
GLOVE BIO SURGEON STRL SZ 6.5 (GLOVE) ×2 IMPLANT
GLOVE BIO SURGEON STRL SZ7 (GLOVE) ×3 IMPLANT
GLOVE BIO SURGEONS STRL SZ 6.5 (GLOVE) ×2
GLOVE BIOGEL PI IND STRL 6.5 (GLOVE) IMPLANT
GLOVE BIOGEL PI IND STRL 7.5 (GLOVE) ×1 IMPLANT
GLOVE BIOGEL PI INDICATOR 6.5 (GLOVE) ×6
GLOVE BIOGEL PI INDICATOR 7.5 (GLOVE) ×2
GLOVE ECLIPSE 6.5 STRL STRAW (GLOVE) ×2 IMPLANT
GOWN STRL REUS W/ TWL LRG LVL3 (GOWN DISPOSABLE) ×3 IMPLANT
GOWN STRL REUS W/TWL LRG LVL3 (GOWN DISPOSABLE) ×12
HEMOSTAT SPONGE AVITENE ULTRA (HEMOSTASIS) ×4 IMPLANT
KIT BASIN OR (CUSTOM PROCEDURE TRAY) ×3 IMPLANT
KIT ROOM TURNOVER OR (KITS) ×3 IMPLANT
LIQUID BAND (GAUZE/BANDAGES/DRESSINGS) ×3 IMPLANT
NS IRRIG 1000ML POUR BTL (IV SOLUTION) ×3 IMPLANT
PACK CV ACCESS (CUSTOM PROCEDURE TRAY) ×3 IMPLANT
PAD ARMBOARD 7.5X6 YLW CONV (MISCELLANEOUS) ×6 IMPLANT
SPONGE GAUZE 4X4 12PLY STER LF (GAUZE/BANDAGES/DRESSINGS) ×2 IMPLANT
SUT MNCRL AB 4-0 PS2 18 (SUTURE) ×3 IMPLANT
SUT PROLENE 6 0 BV (SUTURE) ×7 IMPLANT
SUT PROLENE 7 0 BV 1 (SUTURE) IMPLANT
SUT SILK 2 0 (SUTURE) ×3
SUT SILK 2 0 FS (SUTURE) ×2 IMPLANT
SUT SILK 2 0 SH (SUTURE) ×2 IMPLANT
SUT SILK 2-0 18XBRD TIE 12 (SUTURE) IMPLANT
SUT SILK 3 0 (SUTURE) ×3
SUT SILK 3-0 18XBRD TIE 12 (SUTURE) IMPLANT
SUT SILK 4 0 (SUTURE) ×6
SUT SILK 4-0 18XBRD TIE 12 (SUTURE) IMPLANT
SUT VIC AB 2-0 CT1 27 (SUTURE) ×3
SUT VIC AB 2-0 CT1 TAPERPNT 27 (SUTURE) ×1 IMPLANT
SUT VIC AB 3-0 SH 27 (SUTURE) ×12
SUT VIC AB 3-0 SH 27X BRD (SUTURE) IMPLANT
UNDERPAD 30X30 (UNDERPADS AND DIAPERS) ×3 IMPLANT
WATER STERILE IRR 1000ML POUR (IV SOLUTION) ×3 IMPLANT

## 2016-02-08 NOTE — Discharge Instructions (Signed)
° ° °  02/08/2016 [name] [id number] 10/30/1940  Surgeon(s): Conrad Fox [place], MD  Procedure(s): SECOND STAGE LEFT BRACHIAL VEIN TRANSPOSITION  x Do not stick fistula for 6 weeks

## 2016-02-08 NOTE — Anesthesia Procedure Notes (Signed)
Procedure Name: LMA Insertion Date/Time: 02/08/2016 12:18 PM Performed by: Everlean Cherry A Pre-anesthesia Checklist: Emergency Drugs available, Patient identified, Suction available and Patient being monitored Patient Re-evaluated:Patient Re-evaluated prior to inductionOxygen Delivery Method: Circle system utilized Preoxygenation: Pre-oxygenation with 100% oxygen Intubation Type: IV induction Ventilation: Mask ventilation without difficulty LMA: LMA inserted LMA Size: 4.0 Number of attempts: 1 Placement Confirmation: positive ETCO2 and breath sounds checked- equal and bilateral Tube secured with: Tape Dental Injury: Teeth and Oropharynx as per pre-operative assessment

## 2016-02-08 NOTE — Op Note (Signed)
    OPERATIVE NOTE   PROCEDURE: left second stage brachial vein transposition (brachiobrachial arteriovenous fistula) placement  PRE-OPERATIVE DIAGNOSIS: end stage renal disease-HD, s/p successful first stage brachial vein transposition   POST-OPERATIVE DIAGNOSIS: same as above   SURGEON: Adele Barthel, MD  ASSISTANT(S): Leontine Locket, PAC   ANESTHESIA: general  ESTIMATED BLOOD LOSS: 50 cc  FINDING(S): 1.  Fistula: > 6 mm throughout most of fistula except for a taper segment distally down to 4 mm 2.  Easily palpable thrill in fistula at end of case  SPECIMEN(S):  none  INDICATIONS:   [name] is a 75 y.o. male who presents with end stage renal disease-HD s/p successful left first stage brachial vein transposition.  The patient is scheduled for left second stage brachial vein transposition.  The patient is aware the risks include but are not limited to: bleeding, infection, steal syndrome, nerve damage, ischemic monomelic neuropathy, failure to mature, and need for additional procedures.  The patient is aware of the risks of the procedure and elects to proceed forward.   DESCRIPTION: After full informed written consent was obtained from the patient, the patient was brought back to the operating room and placed supine upon the operating table.  Prior to induction, the patient received IV antibiotics.   After obtaining adequate anesthesia, the patient was then prepped and draped in the standard fashion for a left arm access procedure.  I turned my attention first to identifying the patient's brachiobrachial arteriovenous fistula.  Using SonoSite guidance, the location of this fistula was marked out on the skin.  I made an longitudinal incision over the fistula from its arterial anastomosis up to its axillary extent.  I carefully dissected the fistula away from its adjacent nerves, ligating side branches in the process.  Eventually the entirety of this fistula was mobilized.  At this  point, I dissected a plane superficial to the bicipital fascia.  The fistula was secured in this new location with interrupted 3-0 Vicryl stitches tied from side branches to the underlying fascia.  The deep subcutaneous tissue was inspected for bleeding.  Bleeding was controlled with electrocautery and placement of large pieces of thrombin and gelfoam.  I washed out the surgical site after waiting a few minutes, and there was no further bleeding.  I reapproximated the fascia with horizontal mattress stitches of 2-0 Vicryl.  The deep subcutaneous tissue was reapproximated with mattress sutures of 3-0 Vicryl to eliminate some of the dead space.  The superficial subcutaneous tissue was then reapproximated along the incision line with a running stitch of 3-0 Vicryl.  The skin was then reapproximated with a running subcuticular of 4-0 Monocryl.  The skin was then cleaned, dried, and reinforced with Dermabond.  The patient tolerated this procedure well.     COMPLICATIONS: none  CONDITION: stable   Adele Barthel, MD, [facility] [place] Office: [phone] Pager: [phone]  02/08/2016, 2:55 PM

## 2016-02-08 NOTE — Anesthesia Preprocedure Evaluation (Addendum)
Anesthesia Evaluation  Patient identified by MRN, date of birth, ID band Patient awake    Reviewed: Allergy & Precautions, NPO status , Patient's Chart, lab work & pertinent test results  Airway Mallampati: II  TM Distance: >3 FB Neck ROM: Full    Dental  (+) Dental Advisory Given   Pulmonary former smoker,    breath sounds clear to auscultation       Cardiovascular hypertension, Pt. on medications + CAD and +CHF  + Valvular Problems/Murmurs AS  Rhythm:Regular Rate:Normal  05/2015 Echo: Left ventricle: The cavity size was mildly dilated. Wall  thickness was increased in a pattern of moderate LVH. There was mild focal basal hypertrophy of the septum. Systolic function was  normal. The estimated ejection fraction was in the range of 60  to 65%. Wall motion was normal; there were no regional wall   motion abnormalities. Doppler parameters are consistent with  abnormal left ventricular relaxation (grade 1 diastolic  dysfunction). - Aortic valve: There was moderate stenosis. There was trivial  regurgitation. Valve area (VTI): 0.89 cm^2. Valve area (Vmax):  1.02 cm^2. Valve area (Vmean): 0.91 cm^2. - Mitral valve: Calcified annulus. Mildly thickened leaflets .   There was mild regurgitation. - Left atrium: The atrium was moderately dilated.   Neuro/Psych CVA    GI/Hepatic Neg liver ROS, PUD,   Endo/Other  negative endocrine ROS  Renal/GU ESRF and DialysisRenal disease     Musculoskeletal  (+) Arthritis ,   Abdominal   Peds  Hematology negative hematology ROS (+)   Anesthesia Other Findings   Reproductive/Obstetrics                             Lab Results  Component Value Date   WBC 9.7 07/06/2015   HGB 18.7 (H) 02/08/2016   HCT 55.0 (H) 02/08/2016   MCV 64.9 (L) 07/06/2015   PLT 330 07/06/2015   Lab Results  Component Value Date   CREATININE 10.30 (H) 12/14/2015   BUN 34 (H) 12/14/2015   NA  135 02/08/2016   K 4.2 02/08/2016   CL 104 12/14/2015   CO2 21 07/06/2015    Anesthesia Physical Anesthesia Plan  ASA: III  Anesthesia Plan: General   Post-op Pain Management:    Induction: Intravenous  Airway Management Planned: LMA  Additional Equipment:   Intra-op Plan:   Post-operative Plan: Extubation in OR  Informed Consent: I have reviewed the patients History and Physical, chart, labs and discussed the procedure including the risks, benefits and alternatives for the proposed anesthesia with the patient or authorized representative who has indicated his/her understanding and acceptance.   Dental advisory given  Plan Discussed with: CRNA  Anesthesia Plan Comments:         Anesthesia Quick Evaluation

## 2016-02-08 NOTE — H&P (View-Only) (Signed)
Postoperative Access Visit   History of Present Illness  [name] is a 75 y.o. year old male who presents for postoperative follow-up for: L 1st BRVT, ligation of L RC AVF (Date: 12/19/15).  The patient's wounds are healed.  The patient notes no steal symptoms.  The patient is able to complete their activities of daily living.  The patient's current symptoms are: none.  Past Medical History:  Diagnosis Date  . Arthritis   . Cardiomyopathy ([place])   . CVA (cerebral infarction)   . Dialysis patient ([place])   . ESRD (end stage renal disease) on dialysis ([place])    M/W/F  . Gastric ulcer 2004   treated for h.pylori see PSH  . Hypertension   . Low iron   . LVH (left ventricular hypertrophy)   . Stroke [facility])    left side weakness    Past Surgical History:  Procedure Laterality Date  . ABDOMINAL AORTAGRAM N/A 01/24/2012   Procedure: ABDOMINAL Maxcine Ham;  Surgeon: Elam Dutch, MD;  Location: [facility] CATH LAB;  Service: Cardiovascular;  Laterality: N/A;  . ARTERIOVENOUS GRAFT PLACEMENT Right right arm  . AV FISTULA PLACEMENT Left 08/31/2015   Procedure: ARTERIOVENOUS (AV) FISTULA CREATION- LEFT RADIOCEPHALIC;  Surgeon: Mal Misty, MD;  Location: [place];  Service: Vascular;  Laterality: Left;  . BASCILIC VEIN TRANSPOSITION Left 12/19/2015   Procedure: FIRST STAGE BRACHIAL VEIN TRANSPOSITION;  Surgeon: Conrad Hardwick, MD;  Location: [place];  Service: Vascular;  Laterality: Left;  . CARDIAC CATHETERIZATION N/A 07/11/2015   Procedure: Left Heart Cath and Coronary Angiography;  Surgeon: Troy Sine, MD;  Location: [place] CV LAB;  Service: Cardiovascular;  Laterality: N/A;  . Carpel Tunnel Left Dec. 22, 2016  . CHOLECYSTECTOMY    . COLONOSCOPY  2004   Dr. Irving Shows, left sided diverticula and cecal polyp, path unknown  . COLONOSCOPY  10/29/2011   Procedure: COLONOSCOPY;  Surgeon: Daneil Dolin, MD;  [facility]O [facility];  Service: Endoscopy;  Laterality: N/A;  10:15  .  ESOPHAGOGASTRODUODENOSCOPY  11/2002   Dr. Gala Romney, erosive reflux esophagitis, multiple gastric ulcer and antral/bulbar erosions. Serologies positive for H.Pylori and was treated  . ESOPHAGOGASTRODUODENOSCOPY  11/20014   Dr. Gala Romney, small hh only, ulcers healed  . ESOPHAGOGASTRODUODENOSCOPY  09/21/2011   Dr Trevor Iha [facility], antral erosions, ?early GAVE  . LIGATION OF ARTERIOVENOUS  FISTULA Left 12/19/2015   Procedure: LIGATION OF RADIOCEPHALIC ARTERIOVENOUS  FISTULA;  Surgeon: Conrad St. Peters, MD;  Location: [place];  Service: Vascular;  Laterality: Left;  . PERIPHERAL VASCULAR CATHETERIZATION N/A 12/14/2015   Procedure: Fistulagram;  Surgeon: Conrad Greenvale, MD;  Location: [place] CV LAB;  Service: Cardiovascular;  Laterality: N/A;  . SHOULDER SURGERY      Social History   Social History  . Marital status: Single    Spouse name: N/A  . Number of children: 2  . Years of education: N/A   Occupational History  . retired, [employer] Retired   Social History Main Topics  . Smoking status: Former Smoker    Quit date: 04/29/2004  . Smokeless tobacco: Former Systems developer    Types: [place] date: 01/16/1987     Comment: quit 2006  . Alcohol use No  . Drug use: No  . Sexual activity: Not on file   Other Topics Concern  . Not on file   Social History Narrative   Lives alone   Daughter 20-min away  Caffeine use: 32oz soda per day    Family History  Problem Relation Age of Onset  . Hypertension Mother   . Colon cancer Neg Hx   . Liver disease Neg Hx     Current Outpatient Prescriptions  Medication Sig Dispense Refill  . ALPRAZolam (XANAX) 0.5 MG tablet Take 0.5 mg by mouth daily.  3  . amLODipine (NORVASC) 10 MG tablet Take 10 mg by mouth daily.    Marland Kitchen atorvastatin (LIPITOR) 40 MG tablet Take 40 mg by mouth daily.  3  . b complex-vitamin c-folic acid (NEPHRO-VITE) 0.8 MG TABS Take 0.8 mg by mouth as directed. Takes on Tuesdays, Thursdays, Saturdays, and Sundays. Does not take on Mondays,  Wednesdays, and Fridays due to Dialysis treatments.    . cinacalcet (SENSIPAR) 60 MG tablet Take 60 mg by mouth every evening. With evening meal    . loratadine (CLARITIN) 10 MG tablet Take 1 tablet (10 mg total) by mouth every other day as needed for allergies. 30 tablet 0  . meclizine (ANTIVERT) 25 MG tablet Take 25 mg by mouth daily.    Marland Kitchen oxyCODONE-acetaminophen (PERCOCET/ROXICET) 5-325 MG tablet Take 1 tablet by mouth every 6 (six) hours as needed. 6 tablet 0  . promethazine (PHENERGAN) 25 MG tablet Take 25 mg by mouth every other day.    . sevelamer (RENVELA) 800 MG tablet Take 2,400-4,000 mg by mouth 3 (three) times daily with meals. Patient takes 5 tablets with meals and 3 tablets with snacks     No current facility-administered medications for this visit.      Allergies  Allergen Reactions  . Aspirin Other (See Comments)    Causes internal bleeding      REVIEW OF SYSTEMS:  (Positives checked otherwise negative)  CARDIOVASCULAR:   [ ]  chest pain,  [ ]  chest pressure,  [ ]  palpitations,  [ ]  shortness of breath when laying flat,  [ ]  shortness of breath with exertion,   [ ]  pain in feet when walking,  [ ]  pain in feet when laying flat, [ ]  history of blood clot in veins (DVT),  [ ]  history of phlebitis,  [ ]  swelling in legs,  [ ]  varicose veins  PULMONARY:   [ ]  productive cough,  [ ]  asthma,  [ ]  wheezing  NEUROLOGIC:   [ ]  weakness in arms or legs,  [ ]  numbness in arms or legs,  [ ]  difficulty speaking or slurred speech,  [ ]  temporary loss of vision in one eye,  [ ]  dizziness  HEMATOLOGIC:   [ ]  bleeding problems,  [ ]  problems with blood clotting too easily  MUSCULOSKEL:   [ ]  joint pain, [ ]  joint swelling  GASTROINTEST:   [ ]  vomiting blood,  [ ]  blood in stool     GENITOURINARY:   [ ]  burning with urination,  [ ]  blood in urine [x]  end stage renal disease-HD: M/W/F   PSYCHIATRIC:   [ ]  history of major depression  INTEGUMENTARY:   [ ]   rashes,  [ ]  ulcers  CONSTITUTIONAL:   [ ]  fever,  [ ]  chills    Physical Examination Vitals:   02/02/16 1057  BP: 133/74  Pulse: 86  Resp: 16  Temp: 97.8 F (36.6 C)   Pulmonary: Sym exp, good air movt, CTAB, no rales, rhonchi, & wheezing  Cardiac: RRR, Nl S1, S2, no Murmurs, rubs or gallops  LUE: Incision is healed, skin feels warm, hand grip is 5/5,  sensation in digits is intact, palpable thrill, bruit can  be auscultated, on Sonosite: fistula >6 mm throughout   [name] is a 75 y.o. year old male who presents s/p L 1st BRVT, ligation of L RC AVF.   The patient's access is ready for transposition.  This is scheduled for this coming Thursday, 12 OCT 17. Risk, benefits, and alternatives to access surgery were discussed.   The patient is aware the risks include but are not limited to: bleeding, infection, steal syndrome, nerve damage, ischemic monomelic neuropathy, failure to mature, need for additional procedures, death and stroke.  The patient agrees to proceed forward with the procedure.  Thank you for allowing [place] to participate in this patient's care.  Adele Barthel, MD, FACS [facility] [place] Office: [phone] Pager: [phone]

## 2016-02-08 NOTE — Transfer of Care (Signed)
Immediate Anesthesia Transfer of Care Note  Patient: [name]  Procedure(s) Performed: Procedure(s): SECOND STAGE BRACHIAL VEIN TRANSPOSITION (Left)  Patient Location: PACU  Anesthesia Type:General  Level of Consciousness: awake, alert , oriented and patient cooperative  Airway & Oxygen Therapy: Patient Spontanous Breathing and Patient connected to nasal cannula oxygen  Post-op Assessment: Report given to RN, Post -op Vital signs reviewed and stable and Patient moving all extremities X 4  Post vital signs: Reviewed and stable  Last Vitals:  Vitals:   02/08/16 0839 02/08/16 1516  BP: 108/67   Pulse: 72   Resp: 16   Temp:  36.6 C    Last Pain:  Vitals:   02/08/16 0839  TempSrc: Oral         Complications: No apparent anesthesia complications

## 2016-02-08 NOTE — Interval H&P Note (Signed)
History and Physical Interval Note:  02/08/2016 11:05 AM  [name]  has presented today for surgery, with the diagnosis of End Stage Renal Disease N18.6  The various methods of treatment have been discussed with the patient and family. After consideration of risks, benefits and other options for treatment, the patient has consented to  Procedure(s): [place] (Left) as a surgical intervention .  The patient's history has been reviewed, patient examined, no change in status, stable for surgery.  I have reviewed the patient's chart and labs.  Questions were answered to the patient's satisfaction.     Adele Barthel

## 2016-02-08 NOTE — Anesthesia Postprocedure Evaluation (Signed)
Anesthesia Post Note  Patient: [name]  Procedure(s) Performed: Procedure(s) (LRB): SECOND STAGE BRACHIAL VEIN TRANSPOSITION (Left)  Patient location during evaluation: PACU Anesthesia Type: General Level of consciousness: awake and alert and oriented Pain management: pain level controlled Vital Signs Assessment: post-procedure vital signs reviewed and stable Respiratory status: spontaneous breathing, nonlabored ventilation and respiratory function stable Cardiovascular status: blood pressure returned to baseline and stable Postop Assessment: no signs of nausea or vomiting Anesthetic complications: no Comments: Patient had brief period of emergence delirium after extubation, which has now resolved.    Last Vitals:  Vitals:   02/08/16 1616 02/08/16 1630  BP:    Pulse:  80  Resp:  15  Temp: 36.3 C     Last Pain:  Vitals:   02/08/16 1629  TempSrc:   PainSc: 5                  Vana Arif A.

## 2016-02-09 ENCOUNTER — Telehealth: Payer: Self-pay | Admitting: Vascular Surgery

## 2016-02-09 ENCOUNTER — Encounter (HOSPITAL_COMMUNITY): Payer: Self-pay | Admitting: Vascular Surgery

## 2016-02-09 DIAGNOSIS — N186 End stage renal disease: Secondary | ICD-10-CM | POA: Diagnosis not present

## 2016-02-09 DIAGNOSIS — D509 Iron deficiency anemia, unspecified: Secondary | ICD-10-CM | POA: Diagnosis not present

## 2016-02-09 DIAGNOSIS — Z992 Dependence on renal dialysis: Secondary | ICD-10-CM | POA: Diagnosis not present

## 2016-02-09 DIAGNOSIS — N2581 Secondary hyperparathyroidism of renal origin: Secondary | ICD-10-CM | POA: Diagnosis not present

## 2016-02-09 NOTE — Telephone Encounter (Signed)
-----   Message from Denman George, RN sent at 02/08/2016  4:36 PM EDT ----- Regarding: 4 week f/u with Dr. Bridgett Larsson   ----- Message ----- From: Conrad Milan, MD Sent: 02/08/2016   2:58 PM To: [facility] [address]  [name] [id number] 08-Jul-1940  PROCEDURE: left second stage brachial vein transposition (brachiobrachial arteriovenous fistula) placement  Asst: Leontine Locket, PAC   Follow-up: 4 weeks

## 2016-02-09 NOTE — Telephone Encounter (Signed)
Spoke to pt on mobile number will mail letter as well for appt 11/10

## 2016-02-12 DIAGNOSIS — N186 End stage renal disease: Secondary | ICD-10-CM | POA: Diagnosis not present

## 2016-02-12 DIAGNOSIS — N2581 Secondary hyperparathyroidism of renal origin: Secondary | ICD-10-CM | POA: Diagnosis not present

## 2016-02-12 DIAGNOSIS — D509 Iron deficiency anemia, unspecified: Secondary | ICD-10-CM | POA: Diagnosis not present

## 2016-02-12 DIAGNOSIS — Z992 Dependence on renal dialysis: Secondary | ICD-10-CM | POA: Diagnosis not present

## 2016-02-13 ENCOUNTER — Encounter: Payer: Self-pay | Admitting: Family

## 2016-02-13 ENCOUNTER — Ambulatory Visit (INDEPENDENT_AMBULATORY_CARE_PROVIDER_SITE_OTHER): Payer: Medicare Other | Admitting: Family

## 2016-02-13 VITALS — BP 121/77 | HR 96 | Temp 98.2°F | Resp 18 | Ht 65.0 in | Wt 179.6 lb

## 2016-02-13 DIAGNOSIS — I770 Arteriovenous fistula, acquired: Secondary | ICD-10-CM

## 2016-02-13 DIAGNOSIS — N186 End stage renal disease: Secondary | ICD-10-CM

## 2016-02-13 DIAGNOSIS — Z992 Dependence on renal dialysis: Secondary | ICD-10-CM

## 2016-02-13 MED ORDER — TRAMADOL HCL 50 MG PO TABS: 50.0000 mg | ORAL_TABLET | Freq: Two times a day (BID) | ORAL | Status: DC | PRN

## 2016-02-13 MED ORDER — TRAMADOL HCL 50 MG PO TABS: 50.0000 mg | ORAL_TABLET | Freq: Two times a day (BID) | ORAL | 0 refills | Status: DC | PRN

## 2016-02-13 NOTE — Progress Notes (Signed)
    Postoperative Access Visit   History of Present Illness  [name] is a 75 y.o. year old male who is s/p left second stage brachial vein transposition (brachiobrachial arteriovenous fistula) placement on 02/08/16 by Dr. Bridgett Larsson. He returns today at the request of the renal dialysis center re oozing at the incision. Pt states there is a little drainage at times on his shirt. He denies fever or chills. 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 He dialyzes via left upper chest tunneled dialysis catheter on M-W-F at [facility] in [place].   The patient is able to complete their activities of daily living.    For VQI Use Only  PRE-ADM LIVING: Home  AMB STATUS: Ambulatory  Physical Examination Vitals:   02/13/16 1201  BP: 121/77  Pulse: 96  Resp: 18  Temp: 98.2 F (36.8 C)  TempSrc: Oral  SpO2: 97%  Weight: 179 lb 9.6 oz (81.5 kg)  Height: 5\' 5"  (1.651 m)   Body mass index is 29.89 kg/m.  Left upper arm incision is healing well, scant dried serous drainage at mid incision. S Moderate amount of swelling and ecchymosis surrounding left upper arm incision. Bilateral hand grip is 5/5, sensation in digits is intact, palpable thrill, bruit can be auscultated   Medical Decision Making  [name] is a 75 y.o. year old male who presents s/p left second stage brachial vein transposition (brachiobrachial arteriovenous fistula) placement on 02/08/16. Dr. Donnetta Hutching spoke with and evaluated pt. Swelling and accompanying ecchymosis at left upper and lower arm is likely from a seroma which should resolve, may take a week or so. No signs of infection.  Pt requested a refill on his oxycodoneAPAP 5/325 for intermittent left arm pain. But he is also taking Xanax and phenergan; will prescribe tramadol instead, 1 tab po every 12 hours prn pain, disp #15, 0 refills.   Follow up with Dr. Bridgett Larsson as scheduled on 03/08/16.  Dr. Donnetta Hutching advised pt to  notify us if the swelling worsens, if he develops fever and chills, if the drainage increases.   Thank you for allowing [place] to participate in this patient's care.  Zekiah Caruth, Sharmon Leyden, RN, MSN, FNP-C [facility] [place] Office: [phone]  02/13/2016, 12:18 PM  Clinic MD: Early

## 2016-02-14 DIAGNOSIS — N2581 Secondary hyperparathyroidism of renal origin: Secondary | ICD-10-CM | POA: Diagnosis not present

## 2016-02-14 DIAGNOSIS — N186 End stage renal disease: Secondary | ICD-10-CM | POA: Diagnosis not present

## 2016-02-14 DIAGNOSIS — Z992 Dependence on renal dialysis: Secondary | ICD-10-CM | POA: Diagnosis not present

## 2016-02-14 DIAGNOSIS — D509 Iron deficiency anemia, unspecified: Secondary | ICD-10-CM | POA: Diagnosis not present

## 2016-02-15 DIAGNOSIS — M109 Gout, unspecified: Secondary | ICD-10-CM | POA: Diagnosis not present

## 2016-02-15 DIAGNOSIS — K219 Gastro-esophageal reflux disease without esophagitis: Secondary | ICD-10-CM | POA: Diagnosis not present

## 2016-02-15 DIAGNOSIS — N186 End stage renal disease: Secondary | ICD-10-CM | POA: Diagnosis not present

## 2016-02-15 DIAGNOSIS — I10 Essential (primary) hypertension: Secondary | ICD-10-CM | POA: Diagnosis not present

## 2016-02-16 DIAGNOSIS — N186 End stage renal disease: Secondary | ICD-10-CM | POA: Diagnosis not present

## 2016-02-16 DIAGNOSIS — N2581 Secondary hyperparathyroidism of renal origin: Secondary | ICD-10-CM | POA: Diagnosis not present

## 2016-02-16 DIAGNOSIS — Z992 Dependence on renal dialysis: Secondary | ICD-10-CM | POA: Diagnosis not present

## 2016-02-16 DIAGNOSIS — D509 Iron deficiency anemia, unspecified: Secondary | ICD-10-CM | POA: Diagnosis not present

## 2016-02-19 DIAGNOSIS — N186 End stage renal disease: Secondary | ICD-10-CM | POA: Diagnosis not present

## 2016-02-19 DIAGNOSIS — D509 Iron deficiency anemia, unspecified: Secondary | ICD-10-CM | POA: Diagnosis not present

## 2016-02-19 DIAGNOSIS — N2581 Secondary hyperparathyroidism of renal origin: Secondary | ICD-10-CM | POA: Diagnosis not present

## 2016-02-19 DIAGNOSIS — Z992 Dependence on renal dialysis: Secondary | ICD-10-CM | POA: Diagnosis not present

## 2016-02-20 ENCOUNTER — Telehealth: Payer: Self-pay

## 2016-02-20 NOTE — Telephone Encounter (Signed)
Attempted to return call to pt.; rec'd voice message on Triage line re: his pain medication not working.  Left voice message to call office to speak to nurse.

## 2016-02-21 DIAGNOSIS — D509 Iron deficiency anemia, unspecified: Secondary | ICD-10-CM | POA: Diagnosis not present

## 2016-02-21 DIAGNOSIS — N186 End stage renal disease: Secondary | ICD-10-CM | POA: Diagnosis not present

## 2016-02-21 DIAGNOSIS — Z992 Dependence on renal dialysis: Secondary | ICD-10-CM | POA: Diagnosis not present

## 2016-02-21 DIAGNOSIS — N2581 Secondary hyperparathyroidism of renal origin: Secondary | ICD-10-CM | POA: Diagnosis not present

## 2016-02-23 DIAGNOSIS — N2581 Secondary hyperparathyroidism of renal origin: Secondary | ICD-10-CM | POA: Diagnosis not present

## 2016-02-23 DIAGNOSIS — Z992 Dependence on renal dialysis: Secondary | ICD-10-CM | POA: Diagnosis not present

## 2016-02-23 DIAGNOSIS — D509 Iron deficiency anemia, unspecified: Secondary | ICD-10-CM | POA: Diagnosis not present

## 2016-02-23 DIAGNOSIS — N186 End stage renal disease: Secondary | ICD-10-CM | POA: Diagnosis not present

## 2016-02-26 DIAGNOSIS — N2581 Secondary hyperparathyroidism of renal origin: Secondary | ICD-10-CM | POA: Diagnosis not present

## 2016-02-26 DIAGNOSIS — N186 End stage renal disease: Secondary | ICD-10-CM | POA: Diagnosis not present

## 2016-02-26 DIAGNOSIS — Z992 Dependence on renal dialysis: Secondary | ICD-10-CM | POA: Diagnosis not present

## 2016-02-26 DIAGNOSIS — D509 Iron deficiency anemia, unspecified: Secondary | ICD-10-CM | POA: Diagnosis not present

## 2016-02-26 NOTE — Telephone Encounter (Signed)
Called pt. to follow up on status of his post op pain.  Reported his left arm incision is "sore."  Denied any redness or warmth.  Denied any fever/ chills.  Reported the incision is intact.  Reported previously had small amt. of bloody drainage from the incision., but denied any active drainage at present.  Stated there is swelling just below the incision.  Advised to elevate the arm above level of the heart, at intervals.  Also, advised to try Tylenol, OTC for discomfort.  Encouraged to watch for sign/ sx's of infection, and notify the office for any concerns.  Verb. Understanding.

## 2016-02-27 DIAGNOSIS — N186 End stage renal disease: Secondary | ICD-10-CM | POA: Diagnosis not present

## 2016-02-27 DIAGNOSIS — L851 Acquired keratosis [keratoderma] palmaris et plantaris: Secondary | ICD-10-CM | POA: Diagnosis not present

## 2016-02-27 DIAGNOSIS — B351 Tinea unguium: Secondary | ICD-10-CM | POA: Diagnosis not present

## 2016-02-27 DIAGNOSIS — E1142 Type 2 diabetes mellitus with diabetic polyneuropathy: Secondary | ICD-10-CM | POA: Diagnosis not present

## 2016-02-27 DIAGNOSIS — Z992 Dependence on renal dialysis: Secondary | ICD-10-CM | POA: Diagnosis not present

## 2016-02-28 DIAGNOSIS — N186 End stage renal disease: Secondary | ICD-10-CM | POA: Diagnosis not present

## 2016-02-28 DIAGNOSIS — N2581 Secondary hyperparathyroidism of renal origin: Secondary | ICD-10-CM | POA: Diagnosis not present

## 2016-02-28 DIAGNOSIS — Z992 Dependence on renal dialysis: Secondary | ICD-10-CM | POA: Diagnosis not present

## 2016-02-28 DIAGNOSIS — D509 Iron deficiency anemia, unspecified: Secondary | ICD-10-CM | POA: Diagnosis not present

## 2016-03-01 DIAGNOSIS — N186 End stage renal disease: Secondary | ICD-10-CM | POA: Diagnosis not present

## 2016-03-01 DIAGNOSIS — Z992 Dependence on renal dialysis: Secondary | ICD-10-CM | POA: Diagnosis not present

## 2016-03-01 DIAGNOSIS — N2581 Secondary hyperparathyroidism of renal origin: Secondary | ICD-10-CM | POA: Diagnosis not present

## 2016-03-01 DIAGNOSIS — D509 Iron deficiency anemia, unspecified: Secondary | ICD-10-CM | POA: Diagnosis not present

## 2016-03-04 DIAGNOSIS — Z992 Dependence on renal dialysis: Secondary | ICD-10-CM | POA: Diagnosis not present

## 2016-03-04 DIAGNOSIS — N186 End stage renal disease: Secondary | ICD-10-CM | POA: Diagnosis not present

## 2016-03-04 DIAGNOSIS — N2581 Secondary hyperparathyroidism of renal origin: Secondary | ICD-10-CM | POA: Diagnosis not present

## 2016-03-04 DIAGNOSIS — D509 Iron deficiency anemia, unspecified: Secondary | ICD-10-CM | POA: Diagnosis not present

## 2016-03-06 DIAGNOSIS — N186 End stage renal disease: Secondary | ICD-10-CM | POA: Diagnosis not present

## 2016-03-06 DIAGNOSIS — Z992 Dependence on renal dialysis: Secondary | ICD-10-CM | POA: Diagnosis not present

## 2016-03-06 DIAGNOSIS — D509 Iron deficiency anemia, unspecified: Secondary | ICD-10-CM | POA: Diagnosis not present

## 2016-03-06 DIAGNOSIS — N2581 Secondary hyperparathyroidism of renal origin: Secondary | ICD-10-CM | POA: Diagnosis not present

## 2016-03-08 ENCOUNTER — Ambulatory Visit: Payer: Medicare Other | Admitting: Vascular Surgery

## 2016-03-08 DIAGNOSIS — Z992 Dependence on renal dialysis: Secondary | ICD-10-CM | POA: Diagnosis not present

## 2016-03-08 DIAGNOSIS — D509 Iron deficiency anemia, unspecified: Secondary | ICD-10-CM | POA: Diagnosis not present

## 2016-03-08 DIAGNOSIS — N2581 Secondary hyperparathyroidism of renal origin: Secondary | ICD-10-CM | POA: Diagnosis not present

## 2016-03-08 DIAGNOSIS — N186 End stage renal disease: Secondary | ICD-10-CM | POA: Diagnosis not present

## 2016-03-11 DIAGNOSIS — D509 Iron deficiency anemia, unspecified: Secondary | ICD-10-CM | POA: Diagnosis not present

## 2016-03-11 DIAGNOSIS — N186 End stage renal disease: Secondary | ICD-10-CM | POA: Diagnosis not present

## 2016-03-11 DIAGNOSIS — N2581 Secondary hyperparathyroidism of renal origin: Secondary | ICD-10-CM | POA: Diagnosis not present

## 2016-03-11 DIAGNOSIS — Z992 Dependence on renal dialysis: Secondary | ICD-10-CM | POA: Diagnosis not present

## 2016-03-13 DIAGNOSIS — N2581 Secondary hyperparathyroidism of renal origin: Secondary | ICD-10-CM | POA: Diagnosis not present

## 2016-03-13 DIAGNOSIS — Z992 Dependence on renal dialysis: Secondary | ICD-10-CM | POA: Diagnosis not present

## 2016-03-13 DIAGNOSIS — N186 End stage renal disease: Secondary | ICD-10-CM | POA: Diagnosis not present

## 2016-03-13 DIAGNOSIS — D509 Iron deficiency anemia, unspecified: Secondary | ICD-10-CM | POA: Diagnosis not present

## 2016-03-15 DIAGNOSIS — N2581 Secondary hyperparathyroidism of renal origin: Secondary | ICD-10-CM | POA: Diagnosis not present

## 2016-03-15 DIAGNOSIS — Z992 Dependence on renal dialysis: Secondary | ICD-10-CM | POA: Diagnosis not present

## 2016-03-15 DIAGNOSIS — D509 Iron deficiency anemia, unspecified: Secondary | ICD-10-CM | POA: Diagnosis not present

## 2016-03-15 DIAGNOSIS — N186 End stage renal disease: Secondary | ICD-10-CM | POA: Diagnosis not present

## 2016-03-18 DIAGNOSIS — Z992 Dependence on renal dialysis: Secondary | ICD-10-CM | POA: Diagnosis not present

## 2016-03-18 DIAGNOSIS — N2581 Secondary hyperparathyroidism of renal origin: Secondary | ICD-10-CM | POA: Diagnosis not present

## 2016-03-18 DIAGNOSIS — N186 End stage renal disease: Secondary | ICD-10-CM | POA: Diagnosis not present

## 2016-03-18 DIAGNOSIS — D509 Iron deficiency anemia, unspecified: Secondary | ICD-10-CM | POA: Diagnosis not present

## 2016-03-20 DIAGNOSIS — N2581 Secondary hyperparathyroidism of renal origin: Secondary | ICD-10-CM | POA: Diagnosis not present

## 2016-03-20 DIAGNOSIS — Z992 Dependence on renal dialysis: Secondary | ICD-10-CM | POA: Diagnosis not present

## 2016-03-20 DIAGNOSIS — N186 End stage renal disease: Secondary | ICD-10-CM | POA: Diagnosis not present

## 2016-03-20 DIAGNOSIS — D509 Iron deficiency anemia, unspecified: Secondary | ICD-10-CM | POA: Diagnosis not present

## 2016-03-22 DIAGNOSIS — N2581 Secondary hyperparathyroidism of renal origin: Secondary | ICD-10-CM | POA: Diagnosis not present

## 2016-03-22 DIAGNOSIS — D509 Iron deficiency anemia, unspecified: Secondary | ICD-10-CM | POA: Diagnosis not present

## 2016-03-22 DIAGNOSIS — N186 End stage renal disease: Secondary | ICD-10-CM | POA: Diagnosis not present

## 2016-03-22 DIAGNOSIS — Z992 Dependence on renal dialysis: Secondary | ICD-10-CM | POA: Diagnosis not present

## 2016-03-25 DIAGNOSIS — N186 End stage renal disease: Secondary | ICD-10-CM | POA: Diagnosis not present

## 2016-03-25 DIAGNOSIS — D509 Iron deficiency anemia, unspecified: Secondary | ICD-10-CM | POA: Diagnosis not present

## 2016-03-25 DIAGNOSIS — N2581 Secondary hyperparathyroidism of renal origin: Secondary | ICD-10-CM | POA: Diagnosis not present

## 2016-03-25 DIAGNOSIS — Z992 Dependence on renal dialysis: Secondary | ICD-10-CM | POA: Diagnosis not present

## 2016-03-27 ENCOUNTER — Encounter: Payer: Self-pay | Admitting: Vascular Surgery

## 2016-03-27 DIAGNOSIS — N2581 Secondary hyperparathyroidism of renal origin: Secondary | ICD-10-CM | POA: Diagnosis not present

## 2016-03-27 DIAGNOSIS — D509 Iron deficiency anemia, unspecified: Secondary | ICD-10-CM | POA: Diagnosis not present

## 2016-03-27 DIAGNOSIS — N186 End stage renal disease: Secondary | ICD-10-CM | POA: Diagnosis not present

## 2016-03-27 DIAGNOSIS — Z992 Dependence on renal dialysis: Secondary | ICD-10-CM | POA: Diagnosis not present

## 2016-03-28 DIAGNOSIS — N186 End stage renal disease: Secondary | ICD-10-CM | POA: Diagnosis not present

## 2016-03-28 DIAGNOSIS — Z992 Dependence on renal dialysis: Secondary | ICD-10-CM | POA: Diagnosis not present

## 2016-03-28 NOTE — Progress Notes (Signed)
    Postoperative Access Visit   History of Present Illness  GORJE IYER is a 75 y.o. year old male who presents for postoperative follow-up for: L 2nd BRVT (Date: 02/08/16).  The patient's wounds are not completely healed.  The patient notes no steal symptoms.  The patient is able to complete their activities of daily living.  The patient's current symptoms are: hump in L arm.  For VQI Use Only  PRE-ADM LIVING: Home  AMB STATUS: Ambulatory  Physical Examination Vitals:   03/29/16 1121  BP: 126/71  Pulse: 71  Resp: 18  Temp: 98.3 F (36.8 C)    LUE: Incision is healed except for small shallow area in mid-segment, skin feels warm, hand grip is 5/5, sensation in digits is intact, palpable thrill, bruit can be auscultated, On sonosite: large hematoma around fistula in [place] is a 75 y.o. 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   Would wrap L arm in towel and apply warm compress to arm to encourage resolution of the hematoma.    I suspect cannulation would be very difficult until the hematoma resolves.  Will have the pt come back in 4 weeks to check on progress.  Thank you for allowing [place] to participate in this patient's care.  Adele Barthel, MD, FACS [facility] [place] Office: [phone] Pager: [phone]

## 2016-03-29 ENCOUNTER — Encounter: Payer: Self-pay | Admitting: Vascular Surgery

## 2016-03-29 ENCOUNTER — Ambulatory Visit (INDEPENDENT_AMBULATORY_CARE_PROVIDER_SITE_OTHER): Payer: Medicare Other | Admitting: Vascular Surgery

## 2016-03-29 VITALS — BP 126/71 | HR 71 | Temp 98.3°F | Resp 18 | Ht 65.0 in | Wt 176.0 lb

## 2016-03-29 DIAGNOSIS — Z992 Dependence on renal dialysis: Secondary | ICD-10-CM

## 2016-03-29 DIAGNOSIS — D509 Iron deficiency anemia, unspecified: Secondary | ICD-10-CM | POA: Diagnosis not present

## 2016-03-29 DIAGNOSIS — N2581 Secondary hyperparathyroidism of renal origin: Secondary | ICD-10-CM | POA: Diagnosis not present

## 2016-03-29 DIAGNOSIS — N186 End stage renal disease: Secondary | ICD-10-CM | POA: Diagnosis not present

## 2016-04-01 DIAGNOSIS — N2581 Secondary hyperparathyroidism of renal origin: Secondary | ICD-10-CM | POA: Diagnosis not present

## 2016-04-01 DIAGNOSIS — N186 End stage renal disease: Secondary | ICD-10-CM | POA: Diagnosis not present

## 2016-04-01 DIAGNOSIS — Z992 Dependence on renal dialysis: Secondary | ICD-10-CM | POA: Diagnosis not present

## 2016-04-01 DIAGNOSIS — D509 Iron deficiency anemia, unspecified: Secondary | ICD-10-CM | POA: Diagnosis not present

## 2016-04-02 DIAGNOSIS — E1142 Type 2 diabetes mellitus with diabetic polyneuropathy: Secondary | ICD-10-CM | POA: Diagnosis not present

## 2016-04-02 DIAGNOSIS — L851 Acquired keratosis [keratoderma] palmaris et plantaris: Secondary | ICD-10-CM | POA: Diagnosis not present

## 2016-04-02 DIAGNOSIS — B351 Tinea unguium: Secondary | ICD-10-CM | POA: Diagnosis not present

## 2016-04-03 DIAGNOSIS — Z992 Dependence on renal dialysis: Secondary | ICD-10-CM | POA: Diagnosis not present

## 2016-04-03 DIAGNOSIS — D509 Iron deficiency anemia, unspecified: Secondary | ICD-10-CM | POA: Diagnosis not present

## 2016-04-03 DIAGNOSIS — N2581 Secondary hyperparathyroidism of renal origin: Secondary | ICD-10-CM | POA: Diagnosis not present

## 2016-04-03 DIAGNOSIS — N186 End stage renal disease: Secondary | ICD-10-CM | POA: Diagnosis not present

## 2016-04-05 DIAGNOSIS — N2581 Secondary hyperparathyroidism of renal origin: Secondary | ICD-10-CM | POA: Diagnosis not present

## 2016-04-05 DIAGNOSIS — N186 End stage renal disease: Secondary | ICD-10-CM | POA: Diagnosis not present

## 2016-04-05 DIAGNOSIS — Z992 Dependence on renal dialysis: Secondary | ICD-10-CM | POA: Diagnosis not present

## 2016-04-05 DIAGNOSIS — D509 Iron deficiency anemia, unspecified: Secondary | ICD-10-CM | POA: Diagnosis not present

## 2016-04-08 DIAGNOSIS — Z992 Dependence on renal dialysis: Secondary | ICD-10-CM | POA: Diagnosis not present

## 2016-04-08 DIAGNOSIS — N2581 Secondary hyperparathyroidism of renal origin: Secondary | ICD-10-CM | POA: Diagnosis not present

## 2016-04-08 DIAGNOSIS — D509 Iron deficiency anemia, unspecified: Secondary | ICD-10-CM | POA: Diagnosis not present

## 2016-04-08 DIAGNOSIS — N186 End stage renal disease: Secondary | ICD-10-CM | POA: Diagnosis not present

## 2016-04-10 DIAGNOSIS — D509 Iron deficiency anemia, unspecified: Secondary | ICD-10-CM | POA: Diagnosis not present

## 2016-04-10 DIAGNOSIS — N186 End stage renal disease: Secondary | ICD-10-CM | POA: Diagnosis not present

## 2016-04-10 DIAGNOSIS — Z992 Dependence on renal dialysis: Secondary | ICD-10-CM | POA: Diagnosis not present

## 2016-04-10 DIAGNOSIS — N2581 Secondary hyperparathyroidism of renal origin: Secondary | ICD-10-CM | POA: Diagnosis not present

## 2016-04-12 DIAGNOSIS — N186 End stage renal disease: Secondary | ICD-10-CM | POA: Diagnosis not present

## 2016-04-12 DIAGNOSIS — D509 Iron deficiency anemia, unspecified: Secondary | ICD-10-CM | POA: Diagnosis not present

## 2016-04-12 DIAGNOSIS — N2581 Secondary hyperparathyroidism of renal origin: Secondary | ICD-10-CM | POA: Diagnosis not present

## 2016-04-12 DIAGNOSIS — Z992 Dependence on renal dialysis: Secondary | ICD-10-CM | POA: Diagnosis not present

## 2016-04-15 DIAGNOSIS — N2581 Secondary hyperparathyroidism of renal origin: Secondary | ICD-10-CM | POA: Diagnosis not present

## 2016-04-15 DIAGNOSIS — Z992 Dependence on renal dialysis: Secondary | ICD-10-CM | POA: Diagnosis not present

## 2016-04-15 DIAGNOSIS — D509 Iron deficiency anemia, unspecified: Secondary | ICD-10-CM | POA: Diagnosis not present

## 2016-04-15 DIAGNOSIS — N186 End stage renal disease: Secondary | ICD-10-CM | POA: Diagnosis not present

## 2016-04-17 DIAGNOSIS — N2581 Secondary hyperparathyroidism of renal origin: Secondary | ICD-10-CM | POA: Diagnosis not present

## 2016-04-17 DIAGNOSIS — D509 Iron deficiency anemia, unspecified: Secondary | ICD-10-CM | POA: Diagnosis not present

## 2016-04-17 DIAGNOSIS — Z992 Dependence on renal dialysis: Secondary | ICD-10-CM | POA: Diagnosis not present

## 2016-04-17 DIAGNOSIS — N186 End stage renal disease: Secondary | ICD-10-CM | POA: Diagnosis not present

## 2016-04-19 DIAGNOSIS — N186 End stage renal disease: Secondary | ICD-10-CM | POA: Diagnosis not present

## 2016-04-19 DIAGNOSIS — Z992 Dependence on renal dialysis: Secondary | ICD-10-CM | POA: Diagnosis not present

## 2016-04-19 DIAGNOSIS — D509 Iron deficiency anemia, unspecified: Secondary | ICD-10-CM | POA: Diagnosis not present

## 2016-04-19 DIAGNOSIS — N2581 Secondary hyperparathyroidism of renal origin: Secondary | ICD-10-CM | POA: Diagnosis not present

## 2016-04-21 DIAGNOSIS — Z992 Dependence on renal dialysis: Secondary | ICD-10-CM | POA: Diagnosis not present

## 2016-04-21 DIAGNOSIS — N2581 Secondary hyperparathyroidism of renal origin: Secondary | ICD-10-CM | POA: Diagnosis not present

## 2016-04-21 DIAGNOSIS — N186 End stage renal disease: Secondary | ICD-10-CM | POA: Diagnosis not present

## 2016-04-21 DIAGNOSIS — D509 Iron deficiency anemia, unspecified: Secondary | ICD-10-CM | POA: Diagnosis not present

## 2016-04-24 DIAGNOSIS — Z992 Dependence on renal dialysis: Secondary | ICD-10-CM | POA: Diagnosis not present

## 2016-04-24 DIAGNOSIS — D509 Iron deficiency anemia, unspecified: Secondary | ICD-10-CM | POA: Diagnosis not present

## 2016-04-24 DIAGNOSIS — N186 End stage renal disease: Secondary | ICD-10-CM | POA: Diagnosis not present

## 2016-04-24 DIAGNOSIS — N2581 Secondary hyperparathyroidism of renal origin: Secondary | ICD-10-CM | POA: Diagnosis not present

## 2016-04-26 DIAGNOSIS — Z992 Dependence on renal dialysis: Secondary | ICD-10-CM | POA: Diagnosis not present

## 2016-04-26 DIAGNOSIS — N2581 Secondary hyperparathyroidism of renal origin: Secondary | ICD-10-CM | POA: Diagnosis not present

## 2016-04-26 DIAGNOSIS — N186 End stage renal disease: Secondary | ICD-10-CM | POA: Diagnosis not present

## 2016-04-26 DIAGNOSIS — D509 Iron deficiency anemia, unspecified: Secondary | ICD-10-CM | POA: Diagnosis not present

## 2016-04-28 DIAGNOSIS — N186 End stage renal disease: Secondary | ICD-10-CM | POA: Diagnosis not present

## 2016-04-28 DIAGNOSIS — Z992 Dependence on renal dialysis: Secondary | ICD-10-CM | POA: Diagnosis not present

## 2016-04-29 DIAGNOSIS — B9562 Methicillin resistant Staphylococcus aureus infection as the cause of diseases classified elsewhere: Secondary | ICD-10-CM | POA: Diagnosis not present

## 2016-04-29 DIAGNOSIS — D509 Iron deficiency anemia, unspecified: Secondary | ICD-10-CM | POA: Diagnosis not present

## 2016-04-29 DIAGNOSIS — N2581 Secondary hyperparathyroidism of renal origin: Secondary | ICD-10-CM | POA: Diagnosis not present

## 2016-04-29 DIAGNOSIS — L97511 Non-pressure chronic ulcer of other part of right foot limited to breakdown of skin: Secondary | ICD-10-CM | POA: Diagnosis not present

## 2016-04-29 DIAGNOSIS — Z992 Dependence on renal dialysis: Secondary | ICD-10-CM | POA: Diagnosis not present

## 2016-04-29 DIAGNOSIS — N186 End stage renal disease: Secondary | ICD-10-CM | POA: Diagnosis not present

## 2016-05-01 DIAGNOSIS — N186 End stage renal disease: Secondary | ICD-10-CM | POA: Diagnosis not present

## 2016-05-01 DIAGNOSIS — L97511 Non-pressure chronic ulcer of other part of right foot limited to breakdown of skin: Secondary | ICD-10-CM | POA: Diagnosis not present

## 2016-05-01 DIAGNOSIS — Z992 Dependence on renal dialysis: Secondary | ICD-10-CM | POA: Diagnosis not present

## 2016-05-01 DIAGNOSIS — D509 Iron deficiency anemia, unspecified: Secondary | ICD-10-CM | POA: Diagnosis not present

## 2016-05-01 DIAGNOSIS — B9562 Methicillin resistant Staphylococcus aureus infection as the cause of diseases classified elsewhere: Secondary | ICD-10-CM | POA: Diagnosis not present

## 2016-05-01 DIAGNOSIS — N2581 Secondary hyperparathyroidism of renal origin: Secondary | ICD-10-CM | POA: Diagnosis not present

## 2016-05-03 DIAGNOSIS — N2581 Secondary hyperparathyroidism of renal origin: Secondary | ICD-10-CM | POA: Diagnosis not present

## 2016-05-03 DIAGNOSIS — L97511 Non-pressure chronic ulcer of other part of right foot limited to breakdown of skin: Secondary | ICD-10-CM | POA: Diagnosis not present

## 2016-05-03 DIAGNOSIS — D509 Iron deficiency anemia, unspecified: Secondary | ICD-10-CM | POA: Diagnosis not present

## 2016-05-03 DIAGNOSIS — B9562 Methicillin resistant Staphylococcus aureus infection as the cause of diseases classified elsewhere: Secondary | ICD-10-CM | POA: Diagnosis not present

## 2016-05-03 DIAGNOSIS — Z992 Dependence on renal dialysis: Secondary | ICD-10-CM | POA: Diagnosis not present

## 2016-05-03 DIAGNOSIS — N186 End stage renal disease: Secondary | ICD-10-CM | POA: Diagnosis not present

## 2016-05-06 DIAGNOSIS — L97511 Non-pressure chronic ulcer of other part of right foot limited to breakdown of skin: Secondary | ICD-10-CM | POA: Diagnosis not present

## 2016-05-06 DIAGNOSIS — N2581 Secondary hyperparathyroidism of renal origin: Secondary | ICD-10-CM | POA: Diagnosis not present

## 2016-05-06 DIAGNOSIS — N186 End stage renal disease: Secondary | ICD-10-CM | POA: Diagnosis not present

## 2016-05-06 DIAGNOSIS — B9562 Methicillin resistant Staphylococcus aureus infection as the cause of diseases classified elsewhere: Secondary | ICD-10-CM | POA: Diagnosis not present

## 2016-05-06 DIAGNOSIS — Z992 Dependence on renal dialysis: Secondary | ICD-10-CM | POA: Diagnosis not present

## 2016-05-06 DIAGNOSIS — D509 Iron deficiency anemia, unspecified: Secondary | ICD-10-CM | POA: Diagnosis not present

## 2016-05-07 DIAGNOSIS — E1142 Type 2 diabetes mellitus with diabetic polyneuropathy: Secondary | ICD-10-CM | POA: Diagnosis not present

## 2016-05-07 DIAGNOSIS — L0390 Cellulitis, unspecified: Secondary | ICD-10-CM | POA: Diagnosis not present

## 2016-05-07 DIAGNOSIS — L97519 Non-pressure chronic ulcer of other part of right foot with unspecified severity: Secondary | ICD-10-CM | POA: Diagnosis not present

## 2016-05-08 DIAGNOSIS — L97511 Non-pressure chronic ulcer of other part of right foot limited to breakdown of skin: Secondary | ICD-10-CM | POA: Diagnosis not present

## 2016-05-08 DIAGNOSIS — Z992 Dependence on renal dialysis: Secondary | ICD-10-CM | POA: Diagnosis not present

## 2016-05-08 DIAGNOSIS — D509 Iron deficiency anemia, unspecified: Secondary | ICD-10-CM | POA: Diagnosis not present

## 2016-05-08 DIAGNOSIS — B9562 Methicillin resistant Staphylococcus aureus infection as the cause of diseases classified elsewhere: Secondary | ICD-10-CM | POA: Diagnosis not present

## 2016-05-08 DIAGNOSIS — N186 End stage renal disease: Secondary | ICD-10-CM | POA: Diagnosis not present

## 2016-05-08 DIAGNOSIS — N2581 Secondary hyperparathyroidism of renal origin: Secondary | ICD-10-CM | POA: Diagnosis not present

## 2016-05-10 DIAGNOSIS — L97511 Non-pressure chronic ulcer of other part of right foot limited to breakdown of skin: Secondary | ICD-10-CM | POA: Diagnosis not present

## 2016-05-10 DIAGNOSIS — N186 End stage renal disease: Secondary | ICD-10-CM | POA: Diagnosis not present

## 2016-05-10 DIAGNOSIS — Z992 Dependence on renal dialysis: Secondary | ICD-10-CM | POA: Diagnosis not present

## 2016-05-10 DIAGNOSIS — B9562 Methicillin resistant Staphylococcus aureus infection as the cause of diseases classified elsewhere: Secondary | ICD-10-CM | POA: Diagnosis not present

## 2016-05-10 DIAGNOSIS — N2581 Secondary hyperparathyroidism of renal origin: Secondary | ICD-10-CM | POA: Diagnosis not present

## 2016-05-10 DIAGNOSIS — D509 Iron deficiency anemia, unspecified: Secondary | ICD-10-CM | POA: Diagnosis not present

## 2016-05-13 DIAGNOSIS — Z992 Dependence on renal dialysis: Secondary | ICD-10-CM | POA: Diagnosis not present

## 2016-05-13 DIAGNOSIS — N2581 Secondary hyperparathyroidism of renal origin: Secondary | ICD-10-CM | POA: Diagnosis not present

## 2016-05-13 DIAGNOSIS — D509 Iron deficiency anemia, unspecified: Secondary | ICD-10-CM | POA: Diagnosis not present

## 2016-05-13 DIAGNOSIS — L97511 Non-pressure chronic ulcer of other part of right foot limited to breakdown of skin: Secondary | ICD-10-CM | POA: Diagnosis not present

## 2016-05-13 DIAGNOSIS — N186 End stage renal disease: Secondary | ICD-10-CM | POA: Diagnosis not present

## 2016-05-13 DIAGNOSIS — B9562 Methicillin resistant Staphylococcus aureus infection as the cause of diseases classified elsewhere: Secondary | ICD-10-CM | POA: Diagnosis not present

## 2016-05-14 DIAGNOSIS — L97512 Non-pressure chronic ulcer of other part of right foot with fat layer exposed: Secondary | ICD-10-CM | POA: Diagnosis not present

## 2016-05-14 DIAGNOSIS — G629 Polyneuropathy, unspecified: Secondary | ICD-10-CM | POA: Diagnosis not present

## 2016-05-14 DIAGNOSIS — K219 Gastro-esophageal reflux disease without esophagitis: Secondary | ICD-10-CM | POA: Diagnosis not present

## 2016-05-14 DIAGNOSIS — I2510 Atherosclerotic heart disease of native coronary artery without angina pectoris: Secondary | ICD-10-CM | POA: Diagnosis not present

## 2016-05-14 DIAGNOSIS — N186 End stage renal disease: Secondary | ICD-10-CM | POA: Diagnosis not present

## 2016-05-14 DIAGNOSIS — I5022 Chronic systolic (congestive) heart failure: Secondary | ICD-10-CM | POA: Diagnosis not present

## 2016-05-14 DIAGNOSIS — I739 Peripheral vascular disease, unspecified: Secondary | ICD-10-CM | POA: Diagnosis not present

## 2016-05-15 DIAGNOSIS — N186 End stage renal disease: Secondary | ICD-10-CM | POA: Diagnosis not present

## 2016-05-15 DIAGNOSIS — D509 Iron deficiency anemia, unspecified: Secondary | ICD-10-CM | POA: Diagnosis not present

## 2016-05-15 DIAGNOSIS — Z992 Dependence on renal dialysis: Secondary | ICD-10-CM | POA: Diagnosis not present

## 2016-05-15 DIAGNOSIS — B9562 Methicillin resistant Staphylococcus aureus infection as the cause of diseases classified elsewhere: Secondary | ICD-10-CM | POA: Diagnosis not present

## 2016-05-15 DIAGNOSIS — N2581 Secondary hyperparathyroidism of renal origin: Secondary | ICD-10-CM | POA: Diagnosis not present

## 2016-05-15 DIAGNOSIS — L97511 Non-pressure chronic ulcer of other part of right foot limited to breakdown of skin: Secondary | ICD-10-CM | POA: Diagnosis not present

## 2016-05-17 DIAGNOSIS — B9562 Methicillin resistant Staphylococcus aureus infection as the cause of diseases classified elsewhere: Secondary | ICD-10-CM | POA: Diagnosis not present

## 2016-05-17 DIAGNOSIS — D509 Iron deficiency anemia, unspecified: Secondary | ICD-10-CM | POA: Diagnosis not present

## 2016-05-17 DIAGNOSIS — Z992 Dependence on renal dialysis: Secondary | ICD-10-CM | POA: Diagnosis not present

## 2016-05-17 DIAGNOSIS — L97511 Non-pressure chronic ulcer of other part of right foot limited to breakdown of skin: Secondary | ICD-10-CM | POA: Diagnosis not present

## 2016-05-17 DIAGNOSIS — N186 End stage renal disease: Secondary | ICD-10-CM | POA: Diagnosis not present

## 2016-05-17 DIAGNOSIS — N2581 Secondary hyperparathyroidism of renal origin: Secondary | ICD-10-CM | POA: Diagnosis not present

## 2016-05-20 DIAGNOSIS — Z992 Dependence on renal dialysis: Secondary | ICD-10-CM | POA: Diagnosis not present

## 2016-05-20 DIAGNOSIS — N186 End stage renal disease: Secondary | ICD-10-CM | POA: Diagnosis not present

## 2016-05-20 DIAGNOSIS — B9562 Methicillin resistant Staphylococcus aureus infection as the cause of diseases classified elsewhere: Secondary | ICD-10-CM | POA: Diagnosis not present

## 2016-05-20 DIAGNOSIS — L97511 Non-pressure chronic ulcer of other part of right foot limited to breakdown of skin: Secondary | ICD-10-CM | POA: Diagnosis not present

## 2016-05-20 DIAGNOSIS — N2581 Secondary hyperparathyroidism of renal origin: Secondary | ICD-10-CM | POA: Diagnosis not present

## 2016-05-20 DIAGNOSIS — D509 Iron deficiency anemia, unspecified: Secondary | ICD-10-CM | POA: Diagnosis not present

## 2016-05-21 ENCOUNTER — Encounter: Payer: Self-pay | Admitting: Vascular Surgery

## 2016-05-21 DIAGNOSIS — L97521 Non-pressure chronic ulcer of other part of left foot limited to breakdown of skin: Secondary | ICD-10-CM | POA: Diagnosis not present

## 2016-05-21 DIAGNOSIS — G629 Polyneuropathy, unspecified: Secondary | ICD-10-CM | POA: Diagnosis not present

## 2016-05-22 DIAGNOSIS — B9562 Methicillin resistant Staphylococcus aureus infection as the cause of diseases classified elsewhere: Secondary | ICD-10-CM | POA: Diagnosis not present

## 2016-05-22 DIAGNOSIS — N2581 Secondary hyperparathyroidism of renal origin: Secondary | ICD-10-CM | POA: Diagnosis not present

## 2016-05-22 DIAGNOSIS — N186 End stage renal disease: Secondary | ICD-10-CM | POA: Diagnosis not present

## 2016-05-22 DIAGNOSIS — Z992 Dependence on renal dialysis: Secondary | ICD-10-CM | POA: Diagnosis not present

## 2016-05-22 DIAGNOSIS — L97511 Non-pressure chronic ulcer of other part of right foot limited to breakdown of skin: Secondary | ICD-10-CM | POA: Diagnosis not present

## 2016-05-22 DIAGNOSIS — D509 Iron deficiency anemia, unspecified: Secondary | ICD-10-CM | POA: Diagnosis not present

## 2016-05-23 NOTE — Progress Notes (Signed)
    Postoperative Access Visit   History of Present Illness  [name] is a 76 y.o. year old male who presents for postoperative follow-up for: L 2nd BRVT (Date: 02/08/16).  The patient's wounds are healed.  His prior hematoma in L arm is gone.  The patient notes no steal symptoms.  The patient is able to complete their activities of daily living.  The patient's current symptoms are: none.  For VQI Use Only  PRE-ADM LIVING: Home  AMB STATUS: Ambulatory  Physical Examination Vitals:   05/24/16 1208  BP: 122/78  Pulse: 88  Resp: 16  Temp: 97.7 F (36.5 C)    LUE: Incision is healed, skin feels warm, hand grip is 5/5, sensation in digits is intact, palpable thrill, bruit can be auscultated   Medical Decision Making  [name] is a 76 y.o. year old male who presents s/p L 2nd BRVT.   The patient's access is ready for use.  I marked the location of the fistula on the patient's arm.  The patient's tunneled dialysis catheter can be removed after two successful cannulations and completed dialysis treatments.  Thank you for allowing [place] to participate in this patient's care.  Adele Barthel, MD, FACS [facility] [place] Office: [phone] Pager: [phone]

## 2016-05-24 ENCOUNTER — Ambulatory Visit (INDEPENDENT_AMBULATORY_CARE_PROVIDER_SITE_OTHER): Payer: Self-pay | Admitting: Vascular Surgery

## 2016-05-24 VITALS — BP 122/78 | HR 88 | Temp 97.7°F | Resp 16 | Ht 65.0 in | Wt 177.0 lb

## 2016-05-24 DIAGNOSIS — B9562 Methicillin resistant Staphylococcus aureus infection as the cause of diseases classified elsewhere: Secondary | ICD-10-CM | POA: Diagnosis not present

## 2016-05-24 DIAGNOSIS — N186 End stage renal disease: Secondary | ICD-10-CM | POA: Diagnosis not present

## 2016-05-24 DIAGNOSIS — N2581 Secondary hyperparathyroidism of renal origin: Secondary | ICD-10-CM | POA: Diagnosis not present

## 2016-05-24 DIAGNOSIS — Z992 Dependence on renal dialysis: Secondary | ICD-10-CM | POA: Diagnosis not present

## 2016-05-24 DIAGNOSIS — L97511 Non-pressure chronic ulcer of other part of right foot limited to breakdown of skin: Secondary | ICD-10-CM | POA: Diagnosis not present

## 2016-05-24 DIAGNOSIS — D509 Iron deficiency anemia, unspecified: Secondary | ICD-10-CM | POA: Diagnosis not present

## 2016-05-27 DIAGNOSIS — D509 Iron deficiency anemia, unspecified: Secondary | ICD-10-CM | POA: Diagnosis not present

## 2016-05-27 DIAGNOSIS — Z992 Dependence on renal dialysis: Secondary | ICD-10-CM | POA: Diagnosis not present

## 2016-05-27 DIAGNOSIS — B9562 Methicillin resistant Staphylococcus aureus infection as the cause of diseases classified elsewhere: Secondary | ICD-10-CM | POA: Diagnosis not present

## 2016-05-27 DIAGNOSIS — L97511 Non-pressure chronic ulcer of other part of right foot limited to breakdown of skin: Secondary | ICD-10-CM | POA: Diagnosis not present

## 2016-05-27 DIAGNOSIS — N2581 Secondary hyperparathyroidism of renal origin: Secondary | ICD-10-CM | POA: Diagnosis not present

## 2016-05-27 DIAGNOSIS — N186 End stage renal disease: Secondary | ICD-10-CM | POA: Diagnosis not present

## 2016-05-29 DIAGNOSIS — L97511 Non-pressure chronic ulcer of other part of right foot limited to breakdown of skin: Secondary | ICD-10-CM | POA: Diagnosis not present

## 2016-05-29 DIAGNOSIS — N186 End stage renal disease: Secondary | ICD-10-CM | POA: Diagnosis not present

## 2016-05-29 DIAGNOSIS — B9562 Methicillin resistant Staphylococcus aureus infection as the cause of diseases classified elsewhere: Secondary | ICD-10-CM | POA: Diagnosis not present

## 2016-05-29 DIAGNOSIS — N2581 Secondary hyperparathyroidism of renal origin: Secondary | ICD-10-CM | POA: Diagnosis not present

## 2016-05-29 DIAGNOSIS — Z992 Dependence on renal dialysis: Secondary | ICD-10-CM | POA: Diagnosis not present

## 2016-05-29 DIAGNOSIS — D509 Iron deficiency anemia, unspecified: Secondary | ICD-10-CM | POA: Diagnosis not present

## 2016-05-30 DIAGNOSIS — Z992 Dependence on renal dialysis: Secondary | ICD-10-CM | POA: Diagnosis not present

## 2016-05-30 DIAGNOSIS — N186 End stage renal disease: Secondary | ICD-10-CM | POA: Diagnosis not present

## 2016-05-30 DIAGNOSIS — N2581 Secondary hyperparathyroidism of renal origin: Secondary | ICD-10-CM | POA: Diagnosis not present

## 2016-05-31 DIAGNOSIS — Z992 Dependence on renal dialysis: Secondary | ICD-10-CM | POA: Diagnosis not present

## 2016-05-31 DIAGNOSIS — N186 End stage renal disease: Secondary | ICD-10-CM | POA: Diagnosis not present

## 2016-05-31 DIAGNOSIS — N2581 Secondary hyperparathyroidism of renal origin: Secondary | ICD-10-CM | POA: Diagnosis not present

## 2016-06-03 DIAGNOSIS — N186 End stage renal disease: Secondary | ICD-10-CM | POA: Diagnosis not present

## 2016-06-03 DIAGNOSIS — Z992 Dependence on renal dialysis: Secondary | ICD-10-CM | POA: Diagnosis not present

## 2016-06-03 DIAGNOSIS — N2581 Secondary hyperparathyroidism of renal origin: Secondary | ICD-10-CM | POA: Diagnosis not present

## 2016-06-04 DIAGNOSIS — G629 Polyneuropathy, unspecified: Secondary | ICD-10-CM | POA: Diagnosis not present

## 2016-06-04 DIAGNOSIS — L97511 Non-pressure chronic ulcer of other part of right foot limited to breakdown of skin: Secondary | ICD-10-CM | POA: Diagnosis not present

## 2016-06-05 DIAGNOSIS — N186 End stage renal disease: Secondary | ICD-10-CM | POA: Diagnosis not present

## 2016-06-05 DIAGNOSIS — N2581 Secondary hyperparathyroidism of renal origin: Secondary | ICD-10-CM | POA: Diagnosis not present

## 2016-06-05 DIAGNOSIS — Z992 Dependence on renal dialysis: Secondary | ICD-10-CM | POA: Diagnosis not present

## 2016-06-07 DIAGNOSIS — N186 End stage renal disease: Secondary | ICD-10-CM | POA: Diagnosis not present

## 2016-06-07 DIAGNOSIS — Z992 Dependence on renal dialysis: Secondary | ICD-10-CM | POA: Diagnosis not present

## 2016-06-07 DIAGNOSIS — N2581 Secondary hyperparathyroidism of renal origin: Secondary | ICD-10-CM | POA: Diagnosis not present

## 2016-06-10 DIAGNOSIS — N186 End stage renal disease: Secondary | ICD-10-CM | POA: Diagnosis not present

## 2016-06-10 DIAGNOSIS — Z992 Dependence on renal dialysis: Secondary | ICD-10-CM | POA: Diagnosis not present

## 2016-06-10 DIAGNOSIS — N2581 Secondary hyperparathyroidism of renal origin: Secondary | ICD-10-CM | POA: Diagnosis not present

## 2016-06-12 DIAGNOSIS — N2581 Secondary hyperparathyroidism of renal origin: Secondary | ICD-10-CM | POA: Diagnosis not present

## 2016-06-12 DIAGNOSIS — Z992 Dependence on renal dialysis: Secondary | ICD-10-CM | POA: Diagnosis not present

## 2016-06-12 DIAGNOSIS — N186 End stage renal disease: Secondary | ICD-10-CM | POA: Diagnosis not present

## 2016-06-14 DIAGNOSIS — N2581 Secondary hyperparathyroidism of renal origin: Secondary | ICD-10-CM | POA: Diagnosis not present

## 2016-06-14 DIAGNOSIS — N186 End stage renal disease: Secondary | ICD-10-CM | POA: Diagnosis not present

## 2016-06-14 DIAGNOSIS — Z992 Dependence on renal dialysis: Secondary | ICD-10-CM | POA: Diagnosis not present

## 2016-06-17 DIAGNOSIS — Z992 Dependence on renal dialysis: Secondary | ICD-10-CM | POA: Diagnosis not present

## 2016-06-17 DIAGNOSIS — N186 End stage renal disease: Secondary | ICD-10-CM | POA: Diagnosis not present

## 2016-06-17 DIAGNOSIS — N2581 Secondary hyperparathyroidism of renal origin: Secondary | ICD-10-CM | POA: Diagnosis not present

## 2016-06-18 DIAGNOSIS — L97511 Non-pressure chronic ulcer of other part of right foot limited to breakdown of skin: Secondary | ICD-10-CM | POA: Diagnosis not present

## 2016-06-19 DIAGNOSIS — N186 End stage renal disease: Secondary | ICD-10-CM | POA: Diagnosis not present

## 2016-06-19 DIAGNOSIS — Z992 Dependence on renal dialysis: Secondary | ICD-10-CM | POA: Diagnosis not present

## 2016-06-19 DIAGNOSIS — N2581 Secondary hyperparathyroidism of renal origin: Secondary | ICD-10-CM | POA: Diagnosis not present

## 2016-06-21 DIAGNOSIS — N186 End stage renal disease: Secondary | ICD-10-CM | POA: Diagnosis not present

## 2016-06-21 DIAGNOSIS — Z992 Dependence on renal dialysis: Secondary | ICD-10-CM | POA: Diagnosis not present

## 2016-06-21 DIAGNOSIS — N2581 Secondary hyperparathyroidism of renal origin: Secondary | ICD-10-CM | POA: Diagnosis not present

## 2016-06-24 DIAGNOSIS — Z992 Dependence on renal dialysis: Secondary | ICD-10-CM | POA: Diagnosis not present

## 2016-06-24 DIAGNOSIS — N186 End stage renal disease: Secondary | ICD-10-CM | POA: Diagnosis not present

## 2016-06-24 DIAGNOSIS — N2581 Secondary hyperparathyroidism of renal origin: Secondary | ICD-10-CM | POA: Diagnosis not present

## 2016-06-26 DIAGNOSIS — Z992 Dependence on renal dialysis: Secondary | ICD-10-CM | POA: Diagnosis not present

## 2016-06-26 DIAGNOSIS — N186 End stage renal disease: Secondary | ICD-10-CM | POA: Diagnosis not present

## 2016-06-26 DIAGNOSIS — N2581 Secondary hyperparathyroidism of renal origin: Secondary | ICD-10-CM | POA: Diagnosis not present

## 2016-06-28 DIAGNOSIS — N186 End stage renal disease: Secondary | ICD-10-CM | POA: Diagnosis not present

## 2016-06-28 DIAGNOSIS — Z992 Dependence on renal dialysis: Secondary | ICD-10-CM | POA: Diagnosis not present

## 2016-06-28 DIAGNOSIS — N2581 Secondary hyperparathyroidism of renal origin: Secondary | ICD-10-CM | POA: Diagnosis not present

## 2016-07-01 ENCOUNTER — Telehealth: Payer: Self-pay

## 2016-07-01 DIAGNOSIS — Z992 Dependence on renal dialysis: Secondary | ICD-10-CM | POA: Diagnosis not present

## 2016-07-01 DIAGNOSIS — N186 End stage renal disease: Secondary | ICD-10-CM | POA: Diagnosis not present

## 2016-07-01 DIAGNOSIS — N2581 Secondary hyperparathyroidism of renal origin: Secondary | ICD-10-CM | POA: Diagnosis not present

## 2016-07-01 NOTE — Telephone Encounter (Signed)
Pt on sched for 3/9 at 1:30 gets dialyzed early mornings pt ok'd this time slot

## 2016-07-01 NOTE — Telephone Encounter (Signed)
-----   Message from Conrad Glascock, MD sent at 07/01/2016  1:44 PM EST ----- Regarding: RE: question about OV vs Fistulogram Just needs an appt.  Does not need to be overloaded onto schedule but I think I have a slot open this Friday  ----- Message ----- From: Denman George, RN Sent: 07/01/2016  11:26 AM To: Conrad [place], MD Subject: question about OV vs Fistulogram               Rec'd request from [facility] in [place] for a fistulogram; when called to schedule, it was reported they were able to cannulate the AVF today, but had problems on Friday, 3/2.  Stated there is swelling at site, typically every other treatment day.  Due to a previous large hematoma, does he need to come to office for duplex, or should he be scheduled for Fistulogram? (still has his dialysis catheter)

## 2016-07-02 DIAGNOSIS — L97511 Non-pressure chronic ulcer of other part of right foot limited to breakdown of skin: Secondary | ICD-10-CM | POA: Diagnosis not present

## 2016-07-03 DIAGNOSIS — N2581 Secondary hyperparathyroidism of renal origin: Secondary | ICD-10-CM | POA: Diagnosis not present

## 2016-07-03 DIAGNOSIS — N186 End stage renal disease: Secondary | ICD-10-CM | POA: Diagnosis not present

## 2016-07-03 DIAGNOSIS — Z992 Dependence on renal dialysis: Secondary | ICD-10-CM | POA: Diagnosis not present

## 2016-07-04 NOTE — Progress Notes (Signed)
    Established Dialysis Access  History of Present Illness  [name] is a 76 y.o. 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  Reportedly patient has intermittent had cannulation issues.  His current sx are: none.  He notes no steal symptoms.  Pt reported was successfully cannulated today  The patient's PMH, PSH, SH, and FamHx are unchanged from 05/24/16.  Current Outpatient Prescriptions  Medication Sig Dispense Refill  . ALPRAZolam (XANAX) 0.5 MG tablet Take 0.5 mg by mouth daily.  3  . atorvastatin (LIPITOR) 40 MG tablet Take 40 mg by mouth daily.  3  . b complex-vitamin c-folic acid (NEPHRO-VITE) 0.8 MG TABS Take 0.8 mg by mouth See admin instructions. Takes on Tuesdays, Thursdays, Saturdays, and Sundays. Does not take on Mondays, Wednesdays, and Fridays due to Dialysis treatments.    . cinacalcet (SENSIPAR) 60 MG tablet Take 60 mg by mouth every evening. With evening meal    . loratadine (CLARITIN) 10 MG tablet Take 1 tablet (10 mg total) by mouth every other day as needed for allergies. 30 tablet 0  . meclizine (ANTIVERT) 25 MG tablet Take 25 mg by mouth daily as needed for dizziness (Non dialysis days).     . promethazine (PHENERGAN) 25 MG tablet Take 25 mg by mouth every other day.    . sevelamer (RENVELA) 800 MG tablet Take 2,400-4,000 mg by mouth See admin instructions. Take 4000 mg by mouth 3 times daily with meals and take 2400 mg by mouth with snacks.     No current facility-administered medications for this visit.     Allergies  Allergen Reactions  . Aspirin Other (See Comments)    INTERNAL BLEEDING    On ROS today: no steal sx, no flow rate issues   Physical Examination  Vitals:   07/05/16 1135  BP: 127/79  Pulse: (!) 101  Resp: 18  Temp: (!) 96.9 F (36.1 C)  SpO2: 99%  Weight: 177 lb (80.3 kg)  Height: 5\' 5"  (1.651 m)   Body mass index is 29.45 kg/m.  General: A&O x 3, WD, WN  Pulmonary: Sym exp, good air movt,  CTAB, no rales, rhonchi, & wheezing  Cardiac: RRR, Nl S1, S2, no Murmurs, rubs or gallops  Vascular: Vessel Right Left  Radial Palpable Palpable  Ulnar Not Palpable Not Palpable  Brachial Palpable Palpable   Gastrointestinal: soft, NTND, no G/R, bo HSM, no masses, no CVAT B  Musculoskeletal: M/S 5/5 throughout , Extremities without  ischemic changes , + palpable thrill in access in LUA, + bruit in access  Neurologic: Pain and light touch intact in extremities , Motor exam as listed above   Medical Decision Making  [name] is a 77 y.o. male who presents with ESRD requiring hemodialysis, s/p L staged BRVT   Today's dialysis session proves again that cannulation can be completed in the hands of a competent technician.  If cannulation continues to be an issue, might consider fistulogram to evaluate for any technical issues with the fistula.Adele Barthel, MD, FACS [facility] [place] Office: [phone] Pager: [phone]

## 2016-07-05 ENCOUNTER — Encounter: Payer: Self-pay | Admitting: Vascular Surgery

## 2016-07-05 ENCOUNTER — Ambulatory Visit (INDEPENDENT_AMBULATORY_CARE_PROVIDER_SITE_OTHER): Payer: Medicare Other | Admitting: Vascular Surgery

## 2016-07-05 VITALS — BP 127/79 | HR 101 | Temp 96.9°F | Resp 18 | Ht 65.0 in | Wt 177.0 lb

## 2016-07-05 DIAGNOSIS — N186 End stage renal disease: Secondary | ICD-10-CM | POA: Diagnosis not present

## 2016-07-05 DIAGNOSIS — Z992 Dependence on renal dialysis: Secondary | ICD-10-CM

## 2016-07-05 DIAGNOSIS — N2581 Secondary hyperparathyroidism of renal origin: Secondary | ICD-10-CM | POA: Diagnosis not present

## 2016-07-08 DIAGNOSIS — N186 End stage renal disease: Secondary | ICD-10-CM | POA: Diagnosis not present

## 2016-07-08 DIAGNOSIS — N2581 Secondary hyperparathyroidism of renal origin: Secondary | ICD-10-CM | POA: Diagnosis not present

## 2016-07-08 DIAGNOSIS — Z992 Dependence on renal dialysis: Secondary | ICD-10-CM | POA: Diagnosis not present

## 2016-07-09 DIAGNOSIS — N2581 Secondary hyperparathyroidism of renal origin: Secondary | ICD-10-CM | POA: Diagnosis not present

## 2016-07-09 DIAGNOSIS — N186 End stage renal disease: Secondary | ICD-10-CM | POA: Diagnosis not present

## 2016-07-09 DIAGNOSIS — Z992 Dependence on renal dialysis: Secondary | ICD-10-CM | POA: Diagnosis not present

## 2016-07-10 DIAGNOSIS — N186 End stage renal disease: Secondary | ICD-10-CM | POA: Diagnosis not present

## 2016-07-10 DIAGNOSIS — N2581 Secondary hyperparathyroidism of renal origin: Secondary | ICD-10-CM | POA: Diagnosis not present

## 2016-07-10 DIAGNOSIS — Z992 Dependence on renal dialysis: Secondary | ICD-10-CM | POA: Diagnosis not present

## 2016-07-12 ENCOUNTER — Other Ambulatory Visit (HOSPITAL_COMMUNITY): Payer: Self-pay | Admitting: Nephrology

## 2016-07-12 DIAGNOSIS — N186 End stage renal disease: Secondary | ICD-10-CM

## 2016-07-12 DIAGNOSIS — N2581 Secondary hyperparathyroidism of renal origin: Secondary | ICD-10-CM | POA: Diagnosis not present

## 2016-07-12 DIAGNOSIS — Z992 Dependence on renal dialysis: Secondary | ICD-10-CM | POA: Diagnosis not present

## 2016-07-15 DIAGNOSIS — N2581 Secondary hyperparathyroidism of renal origin: Secondary | ICD-10-CM | POA: Diagnosis not present

## 2016-07-15 DIAGNOSIS — Z992 Dependence on renal dialysis: Secondary | ICD-10-CM | POA: Diagnosis not present

## 2016-07-15 DIAGNOSIS — N186 End stage renal disease: Secondary | ICD-10-CM | POA: Diagnosis not present

## 2016-07-16 ENCOUNTER — Ambulatory Visit (HOSPITAL_COMMUNITY)
Admission: RE | Admit: 2016-07-16 | Discharge: 2016-07-16 | Disposition: A | Discharge status: Home / Self Care | Payer: Medicare Other | Source: Ambulatory Visit | Attending: Nephrology | Admitting: Nephrology

## 2016-07-16 ENCOUNTER — Encounter (HOSPITAL_COMMUNITY): Payer: Self-pay | Admitting: General Surgery

## 2016-07-16 DIAGNOSIS — Z452 Encounter for adjustment and management of vascular access device: Secondary | ICD-10-CM | POA: Insufficient documentation

## 2016-07-16 DIAGNOSIS — N186 End stage renal disease: Secondary | ICD-10-CM | POA: Diagnosis not present

## 2016-07-16 DIAGNOSIS — Z992 Dependence on renal dialysis: Secondary | ICD-10-CM | POA: Diagnosis not present

## 2016-07-16 HISTORY — PX: IR GENERIC HISTORICAL: IMG1180011

## 2016-07-16 MED ORDER — CHLORHEXIDINE GLUCONATE 4 % EX LIQD
CUTANEOUS | Status: AC
Start: 2016-07-16 — End: 2016-07-16
  Filled 2016-07-16: qty 15

## 2016-07-16 MED ORDER — LIDOCAINE HCL (PF) 1 % IJ SOLN
INTRAMUSCULAR | Status: AC
  Filled 2016-07-16: qty 30

## 2016-07-16 NOTE — Procedures (Signed)
Successful removal of a left IJ tunneled HD cath 0 EBL or complications Dressing placed over site

## 2016-07-17 DIAGNOSIS — N2581 Secondary hyperparathyroidism of renal origin: Secondary | ICD-10-CM | POA: Diagnosis not present

## 2016-07-17 DIAGNOSIS — N186 End stage renal disease: Secondary | ICD-10-CM | POA: Diagnosis not present

## 2016-07-17 DIAGNOSIS — Z992 Dependence on renal dialysis: Secondary | ICD-10-CM | POA: Diagnosis not present

## 2016-07-19 DIAGNOSIS — N186 End stage renal disease: Secondary | ICD-10-CM | POA: Diagnosis not present

## 2016-07-19 DIAGNOSIS — N2581 Secondary hyperparathyroidism of renal origin: Secondary | ICD-10-CM | POA: Diagnosis not present

## 2016-07-19 DIAGNOSIS — Z992 Dependence on renal dialysis: Secondary | ICD-10-CM | POA: Diagnosis not present

## 2016-07-22 DIAGNOSIS — Z992 Dependence on renal dialysis: Secondary | ICD-10-CM | POA: Diagnosis not present

## 2016-07-22 DIAGNOSIS — N186 End stage renal disease: Secondary | ICD-10-CM | POA: Diagnosis not present

## 2016-07-22 DIAGNOSIS — N2581 Secondary hyperparathyroidism of renal origin: Secondary | ICD-10-CM | POA: Diagnosis not present

## 2016-07-23 DIAGNOSIS — L97512 Non-pressure chronic ulcer of other part of right foot with fat layer exposed: Secondary | ICD-10-CM | POA: Diagnosis not present

## 2016-07-23 DIAGNOSIS — G629 Polyneuropathy, unspecified: Secondary | ICD-10-CM | POA: Diagnosis not present

## 2016-07-24 DIAGNOSIS — N2581 Secondary hyperparathyroidism of renal origin: Secondary | ICD-10-CM | POA: Diagnosis not present

## 2016-07-24 DIAGNOSIS — Z992 Dependence on renal dialysis: Secondary | ICD-10-CM | POA: Diagnosis not present

## 2016-07-24 DIAGNOSIS — N186 End stage renal disease: Secondary | ICD-10-CM | POA: Diagnosis not present

## 2016-07-26 DIAGNOSIS — N186 End stage renal disease: Secondary | ICD-10-CM | POA: Diagnosis not present

## 2016-07-26 DIAGNOSIS — Z992 Dependence on renal dialysis: Secondary | ICD-10-CM | POA: Diagnosis not present

## 2016-07-26 DIAGNOSIS — N2581 Secondary hyperparathyroidism of renal origin: Secondary | ICD-10-CM | POA: Diagnosis not present

## 2016-07-27 DIAGNOSIS — Z992 Dependence on renal dialysis: Secondary | ICD-10-CM | POA: Diagnosis not present

## 2016-07-27 DIAGNOSIS — N186 End stage renal disease: Secondary | ICD-10-CM | POA: Diagnosis not present

## 2016-07-28 DIAGNOSIS — N2581 Secondary hyperparathyroidism of renal origin: Secondary | ICD-10-CM | POA: Diagnosis not present

## 2016-07-28 DIAGNOSIS — N186 End stage renal disease: Secondary | ICD-10-CM | POA: Diagnosis not present

## 2016-07-28 DIAGNOSIS — D509 Iron deficiency anemia, unspecified: Secondary | ICD-10-CM | POA: Diagnosis not present

## 2016-07-28 DIAGNOSIS — Z992 Dependence on renal dialysis: Secondary | ICD-10-CM | POA: Diagnosis not present

## 2016-07-28 DIAGNOSIS — T82898A Other specified complication of vascular prosthetic devices, implants and grafts, initial encounter: Secondary | ICD-10-CM | POA: Diagnosis not present

## 2016-07-29 DIAGNOSIS — N2581 Secondary hyperparathyroidism of renal origin: Secondary | ICD-10-CM | POA: Diagnosis not present

## 2016-07-29 DIAGNOSIS — N186 End stage renal disease: Secondary | ICD-10-CM | POA: Diagnosis not present

## 2016-07-29 DIAGNOSIS — Z992 Dependence on renal dialysis: Secondary | ICD-10-CM | POA: Diagnosis not present

## 2016-07-29 DIAGNOSIS — D509 Iron deficiency anemia, unspecified: Secondary | ICD-10-CM | POA: Diagnosis not present

## 2016-07-29 DIAGNOSIS — T82898A Other specified complication of vascular prosthetic devices, implants and grafts, initial encounter: Secondary | ICD-10-CM | POA: Diagnosis not present

## 2016-07-30 DIAGNOSIS — L97511 Non-pressure chronic ulcer of other part of right foot limited to breakdown of skin: Secondary | ICD-10-CM | POA: Diagnosis not present

## 2016-07-30 DIAGNOSIS — L97521 Non-pressure chronic ulcer of other part of left foot limited to breakdown of skin: Secondary | ICD-10-CM | POA: Diagnosis not present

## 2016-07-31 DIAGNOSIS — T82898A Other specified complication of vascular prosthetic devices, implants and grafts, initial encounter: Secondary | ICD-10-CM | POA: Diagnosis not present

## 2016-07-31 DIAGNOSIS — N186 End stage renal disease: Secondary | ICD-10-CM | POA: Diagnosis not present

## 2016-07-31 DIAGNOSIS — Z992 Dependence on renal dialysis: Secondary | ICD-10-CM | POA: Diagnosis not present

## 2016-07-31 DIAGNOSIS — N2581 Secondary hyperparathyroidism of renal origin: Secondary | ICD-10-CM | POA: Diagnosis not present

## 2016-07-31 DIAGNOSIS — D509 Iron deficiency anemia, unspecified: Secondary | ICD-10-CM | POA: Diagnosis not present

## 2016-08-01 DIAGNOSIS — T82898A Other specified complication of vascular prosthetic devices, implants and grafts, initial encounter: Secondary | ICD-10-CM | POA: Diagnosis not present

## 2016-08-01 DIAGNOSIS — N186 End stage renal disease: Secondary | ICD-10-CM | POA: Diagnosis not present

## 2016-08-01 DIAGNOSIS — N2581 Secondary hyperparathyroidism of renal origin: Secondary | ICD-10-CM | POA: Diagnosis not present

## 2016-08-01 DIAGNOSIS — Z992 Dependence on renal dialysis: Secondary | ICD-10-CM | POA: Diagnosis not present

## 2016-08-01 DIAGNOSIS — D509 Iron deficiency anemia, unspecified: Secondary | ICD-10-CM | POA: Diagnosis not present

## 2016-08-02 DIAGNOSIS — T82898A Other specified complication of vascular prosthetic devices, implants and grafts, initial encounter: Secondary | ICD-10-CM | POA: Diagnosis not present

## 2016-08-02 DIAGNOSIS — N186 End stage renal disease: Secondary | ICD-10-CM | POA: Diagnosis not present

## 2016-08-02 DIAGNOSIS — N2581 Secondary hyperparathyroidism of renal origin: Secondary | ICD-10-CM | POA: Diagnosis not present

## 2016-08-02 DIAGNOSIS — Z992 Dependence on renal dialysis: Secondary | ICD-10-CM | POA: Diagnosis not present

## 2016-08-02 DIAGNOSIS — D509 Iron deficiency anemia, unspecified: Secondary | ICD-10-CM | POA: Diagnosis not present

## 2016-08-05 DIAGNOSIS — N186 End stage renal disease: Secondary | ICD-10-CM | POA: Diagnosis not present

## 2016-08-05 DIAGNOSIS — Z992 Dependence on renal dialysis: Secondary | ICD-10-CM | POA: Diagnosis not present

## 2016-08-05 DIAGNOSIS — D509 Iron deficiency anemia, unspecified: Secondary | ICD-10-CM | POA: Diagnosis not present

## 2016-08-05 DIAGNOSIS — T82898A Other specified complication of vascular prosthetic devices, implants and grafts, initial encounter: Secondary | ICD-10-CM | POA: Diagnosis not present

## 2016-08-05 DIAGNOSIS — N2581 Secondary hyperparathyroidism of renal origin: Secondary | ICD-10-CM | POA: Diagnosis not present

## 2016-08-07 DIAGNOSIS — N186 End stage renal disease: Secondary | ICD-10-CM | POA: Diagnosis not present

## 2016-08-07 DIAGNOSIS — D509 Iron deficiency anemia, unspecified: Secondary | ICD-10-CM | POA: Diagnosis not present

## 2016-08-07 DIAGNOSIS — Z992 Dependence on renal dialysis: Secondary | ICD-10-CM | POA: Diagnosis not present

## 2016-08-07 DIAGNOSIS — T82898A Other specified complication of vascular prosthetic devices, implants and grafts, initial encounter: Secondary | ICD-10-CM | POA: Diagnosis not present

## 2016-08-07 DIAGNOSIS — N2581 Secondary hyperparathyroidism of renal origin: Secondary | ICD-10-CM | POA: Diagnosis not present

## 2016-08-09 DIAGNOSIS — N2581 Secondary hyperparathyroidism of renal origin: Secondary | ICD-10-CM | POA: Diagnosis not present

## 2016-08-09 DIAGNOSIS — N186 End stage renal disease: Secondary | ICD-10-CM | POA: Diagnosis not present

## 2016-08-09 DIAGNOSIS — T82898A Other specified complication of vascular prosthetic devices, implants and grafts, initial encounter: Secondary | ICD-10-CM | POA: Diagnosis not present

## 2016-08-09 DIAGNOSIS — Z992 Dependence on renal dialysis: Secondary | ICD-10-CM | POA: Diagnosis not present

## 2016-08-09 DIAGNOSIS — D509 Iron deficiency anemia, unspecified: Secondary | ICD-10-CM | POA: Diagnosis not present

## 2016-08-12 DIAGNOSIS — N2581 Secondary hyperparathyroidism of renal origin: Secondary | ICD-10-CM | POA: Diagnosis not present

## 2016-08-12 DIAGNOSIS — D509 Iron deficiency anemia, unspecified: Secondary | ICD-10-CM | POA: Diagnosis not present

## 2016-08-12 DIAGNOSIS — Z992 Dependence on renal dialysis: Secondary | ICD-10-CM | POA: Diagnosis not present

## 2016-08-12 DIAGNOSIS — N186 End stage renal disease: Secondary | ICD-10-CM | POA: Diagnosis not present

## 2016-08-12 DIAGNOSIS — T82898A Other specified complication of vascular prosthetic devices, implants and grafts, initial encounter: Secondary | ICD-10-CM | POA: Diagnosis not present

## 2016-08-13 DIAGNOSIS — I10 Essential (primary) hypertension: Secondary | ICD-10-CM | POA: Diagnosis not present

## 2016-08-13 DIAGNOSIS — K219 Gastro-esophageal reflux disease without esophagitis: Secondary | ICD-10-CM | POA: Diagnosis not present

## 2016-08-13 DIAGNOSIS — N186 End stage renal disease: Secondary | ICD-10-CM | POA: Diagnosis not present

## 2016-08-13 DIAGNOSIS — I2510 Atherosclerotic heart disease of native coronary artery without angina pectoris: Secondary | ICD-10-CM | POA: Diagnosis not present

## 2016-08-14 DIAGNOSIS — T82898A Other specified complication of vascular prosthetic devices, implants and grafts, initial encounter: Secondary | ICD-10-CM | POA: Diagnosis not present

## 2016-08-14 DIAGNOSIS — D509 Iron deficiency anemia, unspecified: Secondary | ICD-10-CM | POA: Diagnosis not present

## 2016-08-14 DIAGNOSIS — Z992 Dependence on renal dialysis: Secondary | ICD-10-CM | POA: Diagnosis not present

## 2016-08-14 DIAGNOSIS — N186 End stage renal disease: Secondary | ICD-10-CM | POA: Diagnosis not present

## 2016-08-14 DIAGNOSIS — N2581 Secondary hyperparathyroidism of renal origin: Secondary | ICD-10-CM | POA: Diagnosis not present

## 2016-08-16 DIAGNOSIS — D509 Iron deficiency anemia, unspecified: Secondary | ICD-10-CM | POA: Diagnosis not present

## 2016-08-16 DIAGNOSIS — N2581 Secondary hyperparathyroidism of renal origin: Secondary | ICD-10-CM | POA: Diagnosis not present

## 2016-08-16 DIAGNOSIS — Z992 Dependence on renal dialysis: Secondary | ICD-10-CM | POA: Diagnosis not present

## 2016-08-16 DIAGNOSIS — T82898A Other specified complication of vascular prosthetic devices, implants and grafts, initial encounter: Secondary | ICD-10-CM | POA: Diagnosis not present

## 2016-08-16 DIAGNOSIS — N186 End stage renal disease: Secondary | ICD-10-CM | POA: Diagnosis not present

## 2016-08-19 DIAGNOSIS — D509 Iron deficiency anemia, unspecified: Secondary | ICD-10-CM | POA: Diagnosis not present

## 2016-08-19 DIAGNOSIS — Z992 Dependence on renal dialysis: Secondary | ICD-10-CM | POA: Diagnosis not present

## 2016-08-19 DIAGNOSIS — N186 End stage renal disease: Secondary | ICD-10-CM | POA: Diagnosis not present

## 2016-08-19 DIAGNOSIS — T82898A Other specified complication of vascular prosthetic devices, implants and grafts, initial encounter: Secondary | ICD-10-CM | POA: Diagnosis not present

## 2016-08-19 DIAGNOSIS — N2581 Secondary hyperparathyroidism of renal origin: Secondary | ICD-10-CM | POA: Diagnosis not present

## 2016-08-20 DIAGNOSIS — G629 Polyneuropathy, unspecified: Secondary | ICD-10-CM | POA: Diagnosis not present

## 2016-08-20 DIAGNOSIS — L97522 Non-pressure chronic ulcer of other part of left foot with fat layer exposed: Secondary | ICD-10-CM | POA: Diagnosis not present

## 2016-08-20 DIAGNOSIS — L97512 Non-pressure chronic ulcer of other part of right foot with fat layer exposed: Secondary | ICD-10-CM | POA: Diagnosis not present

## 2016-08-21 DIAGNOSIS — T82898A Other specified complication of vascular prosthetic devices, implants and grafts, initial encounter: Secondary | ICD-10-CM | POA: Diagnosis not present

## 2016-08-21 DIAGNOSIS — D509 Iron deficiency anemia, unspecified: Secondary | ICD-10-CM | POA: Diagnosis not present

## 2016-08-21 DIAGNOSIS — N2581 Secondary hyperparathyroidism of renal origin: Secondary | ICD-10-CM | POA: Diagnosis not present

## 2016-08-21 DIAGNOSIS — N186 End stage renal disease: Secondary | ICD-10-CM | POA: Diagnosis not present

## 2016-08-21 DIAGNOSIS — Z992 Dependence on renal dialysis: Secondary | ICD-10-CM | POA: Diagnosis not present

## 2016-08-23 DIAGNOSIS — N186 End stage renal disease: Secondary | ICD-10-CM | POA: Diagnosis not present

## 2016-08-23 DIAGNOSIS — D509 Iron deficiency anemia, unspecified: Secondary | ICD-10-CM | POA: Diagnosis not present

## 2016-08-23 DIAGNOSIS — T82898A Other specified complication of vascular prosthetic devices, implants and grafts, initial encounter: Secondary | ICD-10-CM | POA: Diagnosis not present

## 2016-08-23 DIAGNOSIS — Z992 Dependence on renal dialysis: Secondary | ICD-10-CM | POA: Diagnosis not present

## 2016-08-23 DIAGNOSIS — N2581 Secondary hyperparathyroidism of renal origin: Secondary | ICD-10-CM | POA: Diagnosis not present

## 2016-08-26 DIAGNOSIS — D509 Iron deficiency anemia, unspecified: Secondary | ICD-10-CM | POA: Diagnosis not present

## 2016-08-26 DIAGNOSIS — N186 End stage renal disease: Secondary | ICD-10-CM | POA: Diagnosis not present

## 2016-08-26 DIAGNOSIS — Z992 Dependence on renal dialysis: Secondary | ICD-10-CM | POA: Diagnosis not present

## 2016-08-26 DIAGNOSIS — T82898A Other specified complication of vascular prosthetic devices, implants and grafts, initial encounter: Secondary | ICD-10-CM | POA: Diagnosis not present

## 2016-08-26 DIAGNOSIS — N2581 Secondary hyperparathyroidism of renal origin: Secondary | ICD-10-CM | POA: Diagnosis not present

## 2016-08-27 DIAGNOSIS — N186 End stage renal disease: Secondary | ICD-10-CM | POA: Diagnosis not present

## 2016-08-27 DIAGNOSIS — N2581 Secondary hyperparathyroidism of renal origin: Secondary | ICD-10-CM | POA: Diagnosis not present

## 2016-08-27 DIAGNOSIS — Z992 Dependence on renal dialysis: Secondary | ICD-10-CM | POA: Diagnosis not present

## 2016-08-28 DIAGNOSIS — N186 End stage renal disease: Secondary | ICD-10-CM | POA: Diagnosis not present

## 2016-08-28 DIAGNOSIS — N2581 Secondary hyperparathyroidism of renal origin: Secondary | ICD-10-CM | POA: Diagnosis not present

## 2016-08-28 DIAGNOSIS — Z992 Dependence on renal dialysis: Secondary | ICD-10-CM | POA: Diagnosis not present

## 2016-08-30 DIAGNOSIS — N2581 Secondary hyperparathyroidism of renal origin: Secondary | ICD-10-CM | POA: Diagnosis not present

## 2016-08-30 DIAGNOSIS — Z992 Dependence on renal dialysis: Secondary | ICD-10-CM | POA: Diagnosis not present

## 2016-08-30 DIAGNOSIS — N186 End stage renal disease: Secondary | ICD-10-CM | POA: Diagnosis not present

## 2016-09-02 DIAGNOSIS — N186 End stage renal disease: Secondary | ICD-10-CM | POA: Diagnosis not present

## 2016-09-02 DIAGNOSIS — Z992 Dependence on renal dialysis: Secondary | ICD-10-CM | POA: Diagnosis not present

## 2016-09-02 DIAGNOSIS — N2581 Secondary hyperparathyroidism of renal origin: Secondary | ICD-10-CM | POA: Diagnosis not present

## 2016-09-03 DIAGNOSIS — L97511 Non-pressure chronic ulcer of other part of right foot limited to breakdown of skin: Secondary | ICD-10-CM | POA: Diagnosis not present

## 2016-09-03 DIAGNOSIS — G629 Polyneuropathy, unspecified: Secondary | ICD-10-CM | POA: Diagnosis not present

## 2016-09-03 DIAGNOSIS — L97521 Non-pressure chronic ulcer of other part of left foot limited to breakdown of skin: Secondary | ICD-10-CM | POA: Diagnosis not present

## 2016-09-04 DIAGNOSIS — N2581 Secondary hyperparathyroidism of renal origin: Secondary | ICD-10-CM | POA: Diagnosis not present

## 2016-09-04 DIAGNOSIS — N186 End stage renal disease: Secondary | ICD-10-CM | POA: Diagnosis not present

## 2016-09-04 DIAGNOSIS — Z992 Dependence on renal dialysis: Secondary | ICD-10-CM | POA: Diagnosis not present

## 2016-09-06 DIAGNOSIS — N2581 Secondary hyperparathyroidism of renal origin: Secondary | ICD-10-CM | POA: Diagnosis not present

## 2016-09-06 DIAGNOSIS — N186 End stage renal disease: Secondary | ICD-10-CM | POA: Diagnosis not present

## 2016-09-06 DIAGNOSIS — Z992 Dependence on renal dialysis: Secondary | ICD-10-CM | POA: Diagnosis not present

## 2016-09-09 DIAGNOSIS — Z992 Dependence on renal dialysis: Secondary | ICD-10-CM | POA: Diagnosis not present

## 2016-09-09 DIAGNOSIS — N186 End stage renal disease: Secondary | ICD-10-CM | POA: Diagnosis not present

## 2016-09-09 DIAGNOSIS — N2581 Secondary hyperparathyroidism of renal origin: Secondary | ICD-10-CM | POA: Diagnosis not present

## 2016-09-11 DIAGNOSIS — N186 End stage renal disease: Secondary | ICD-10-CM | POA: Diagnosis not present

## 2016-09-11 DIAGNOSIS — Z992 Dependence on renal dialysis: Secondary | ICD-10-CM | POA: Diagnosis not present

## 2016-09-11 DIAGNOSIS — N2581 Secondary hyperparathyroidism of renal origin: Secondary | ICD-10-CM | POA: Diagnosis not present

## 2016-09-13 DIAGNOSIS — N186 End stage renal disease: Secondary | ICD-10-CM | POA: Diagnosis not present

## 2016-09-13 DIAGNOSIS — Z992 Dependence on renal dialysis: Secondary | ICD-10-CM | POA: Diagnosis not present

## 2016-09-13 DIAGNOSIS — N2581 Secondary hyperparathyroidism of renal origin: Secondary | ICD-10-CM | POA: Diagnosis not present

## 2016-09-16 DIAGNOSIS — Z992 Dependence on renal dialysis: Secondary | ICD-10-CM | POA: Diagnosis not present

## 2016-09-16 DIAGNOSIS — N186 End stage renal disease: Secondary | ICD-10-CM | POA: Diagnosis not present

## 2016-09-16 DIAGNOSIS — N2581 Secondary hyperparathyroidism of renal origin: Secondary | ICD-10-CM | POA: Diagnosis not present

## 2016-09-17 DIAGNOSIS — L97521 Non-pressure chronic ulcer of other part of left foot limited to breakdown of skin: Secondary | ICD-10-CM | POA: Diagnosis not present

## 2016-09-17 DIAGNOSIS — G629 Polyneuropathy, unspecified: Secondary | ICD-10-CM | POA: Diagnosis not present

## 2016-09-17 DIAGNOSIS — L97511 Non-pressure chronic ulcer of other part of right foot limited to breakdown of skin: Secondary | ICD-10-CM | POA: Diagnosis not present

## 2016-09-18 DIAGNOSIS — N2581 Secondary hyperparathyroidism of renal origin: Secondary | ICD-10-CM | POA: Diagnosis not present

## 2016-09-18 DIAGNOSIS — N186 End stage renal disease: Secondary | ICD-10-CM | POA: Diagnosis not present

## 2016-09-18 DIAGNOSIS — Z992 Dependence on renal dialysis: Secondary | ICD-10-CM | POA: Diagnosis not present

## 2016-09-20 DIAGNOSIS — N186 End stage renal disease: Secondary | ICD-10-CM | POA: Diagnosis not present

## 2016-09-20 DIAGNOSIS — N2581 Secondary hyperparathyroidism of renal origin: Secondary | ICD-10-CM | POA: Diagnosis not present

## 2016-09-20 DIAGNOSIS — Z992 Dependence on renal dialysis: Secondary | ICD-10-CM | POA: Diagnosis not present

## 2016-09-23 DIAGNOSIS — N186 End stage renal disease: Secondary | ICD-10-CM | POA: Diagnosis not present

## 2016-09-23 DIAGNOSIS — Z992 Dependence on renal dialysis: Secondary | ICD-10-CM | POA: Diagnosis not present

## 2016-09-23 DIAGNOSIS — N2581 Secondary hyperparathyroidism of renal origin: Secondary | ICD-10-CM | POA: Diagnosis not present

## 2016-09-25 DIAGNOSIS — N2581 Secondary hyperparathyroidism of renal origin: Secondary | ICD-10-CM | POA: Diagnosis not present

## 2016-09-25 DIAGNOSIS — Z992 Dependence on renal dialysis: Secondary | ICD-10-CM | POA: Diagnosis not present

## 2016-09-25 DIAGNOSIS — N186 End stage renal disease: Secondary | ICD-10-CM | POA: Diagnosis not present

## 2016-09-26 DIAGNOSIS — Z992 Dependence on renal dialysis: Secondary | ICD-10-CM | POA: Diagnosis not present

## 2016-09-26 DIAGNOSIS — N186 End stage renal disease: Secondary | ICD-10-CM | POA: Diagnosis not present

## 2016-09-27 DIAGNOSIS — N186 End stage renal disease: Secondary | ICD-10-CM | POA: Diagnosis not present

## 2016-09-27 DIAGNOSIS — Z992 Dependence on renal dialysis: Secondary | ICD-10-CM | POA: Diagnosis not present

## 2016-09-27 DIAGNOSIS — D509 Iron deficiency anemia, unspecified: Secondary | ICD-10-CM | POA: Diagnosis not present

## 2016-09-27 DIAGNOSIS — N2581 Secondary hyperparathyroidism of renal origin: Secondary | ICD-10-CM | POA: Diagnosis not present

## 2016-09-30 DIAGNOSIS — N186 End stage renal disease: Secondary | ICD-10-CM | POA: Diagnosis not present

## 2016-09-30 DIAGNOSIS — D509 Iron deficiency anemia, unspecified: Secondary | ICD-10-CM | POA: Diagnosis not present

## 2016-09-30 DIAGNOSIS — Z992 Dependence on renal dialysis: Secondary | ICD-10-CM | POA: Diagnosis not present

## 2016-09-30 DIAGNOSIS — N2581 Secondary hyperparathyroidism of renal origin: Secondary | ICD-10-CM | POA: Diagnosis not present

## 2016-10-02 DIAGNOSIS — N186 End stage renal disease: Secondary | ICD-10-CM | POA: Diagnosis not present

## 2016-10-02 DIAGNOSIS — N2581 Secondary hyperparathyroidism of renal origin: Secondary | ICD-10-CM | POA: Diagnosis not present

## 2016-10-02 DIAGNOSIS — Z992 Dependence on renal dialysis: Secondary | ICD-10-CM | POA: Diagnosis not present

## 2016-10-02 DIAGNOSIS — D509 Iron deficiency anemia, unspecified: Secondary | ICD-10-CM | POA: Diagnosis not present

## 2016-10-04 DIAGNOSIS — D509 Iron deficiency anemia, unspecified: Secondary | ICD-10-CM | POA: Diagnosis not present

## 2016-10-04 DIAGNOSIS — N2581 Secondary hyperparathyroidism of renal origin: Secondary | ICD-10-CM | POA: Diagnosis not present

## 2016-10-04 DIAGNOSIS — Z992 Dependence on renal dialysis: Secondary | ICD-10-CM | POA: Diagnosis not present

## 2016-10-04 DIAGNOSIS — N186 End stage renal disease: Secondary | ICD-10-CM | POA: Diagnosis not present

## 2016-10-06 DIAGNOSIS — Z992 Dependence on renal dialysis: Secondary | ICD-10-CM | POA: Diagnosis not present

## 2016-10-06 DIAGNOSIS — N186 End stage renal disease: Secondary | ICD-10-CM | POA: Diagnosis not present

## 2016-10-06 DIAGNOSIS — D509 Iron deficiency anemia, unspecified: Secondary | ICD-10-CM | POA: Diagnosis not present

## 2016-10-06 DIAGNOSIS — N2581 Secondary hyperparathyroidism of renal origin: Secondary | ICD-10-CM | POA: Diagnosis not present

## 2016-10-07 DIAGNOSIS — Z992 Dependence on renal dialysis: Secondary | ICD-10-CM | POA: Diagnosis not present

## 2016-10-07 DIAGNOSIS — N2581 Secondary hyperparathyroidism of renal origin: Secondary | ICD-10-CM | POA: Diagnosis not present

## 2016-10-07 DIAGNOSIS — N186 End stage renal disease: Secondary | ICD-10-CM | POA: Diagnosis not present

## 2016-10-07 DIAGNOSIS — D509 Iron deficiency anemia, unspecified: Secondary | ICD-10-CM | POA: Diagnosis not present

## 2016-10-09 DIAGNOSIS — N186 End stage renal disease: Secondary | ICD-10-CM | POA: Diagnosis not present

## 2016-10-09 DIAGNOSIS — D509 Iron deficiency anemia, unspecified: Secondary | ICD-10-CM | POA: Diagnosis not present

## 2016-10-09 DIAGNOSIS — N2581 Secondary hyperparathyroidism of renal origin: Secondary | ICD-10-CM | POA: Diagnosis not present

## 2016-10-09 DIAGNOSIS — Z992 Dependence on renal dialysis: Secondary | ICD-10-CM | POA: Diagnosis not present

## 2016-10-11 DIAGNOSIS — N186 End stage renal disease: Secondary | ICD-10-CM | POA: Diagnosis not present

## 2016-10-11 DIAGNOSIS — Z992 Dependence on renal dialysis: Secondary | ICD-10-CM | POA: Diagnosis not present

## 2016-10-11 DIAGNOSIS — D509 Iron deficiency anemia, unspecified: Secondary | ICD-10-CM | POA: Diagnosis not present

## 2016-10-11 DIAGNOSIS — N2581 Secondary hyperparathyroidism of renal origin: Secondary | ICD-10-CM | POA: Diagnosis not present

## 2016-10-14 DIAGNOSIS — Z992 Dependence on renal dialysis: Secondary | ICD-10-CM | POA: Diagnosis not present

## 2016-10-14 DIAGNOSIS — N186 End stage renal disease: Secondary | ICD-10-CM | POA: Diagnosis not present

## 2016-10-14 DIAGNOSIS — N2581 Secondary hyperparathyroidism of renal origin: Secondary | ICD-10-CM | POA: Diagnosis not present

## 2016-10-14 DIAGNOSIS — D509 Iron deficiency anemia, unspecified: Secondary | ICD-10-CM | POA: Diagnosis not present

## 2016-10-15 DIAGNOSIS — G609 Hereditary and idiopathic neuropathy, unspecified: Secondary | ICD-10-CM | POA: Diagnosis not present

## 2016-10-15 DIAGNOSIS — L97529 Non-pressure chronic ulcer of other part of left foot with unspecified severity: Secondary | ICD-10-CM | POA: Diagnosis not present

## 2016-10-15 DIAGNOSIS — L97519 Non-pressure chronic ulcer of other part of right foot with unspecified severity: Secondary | ICD-10-CM | POA: Diagnosis not present

## 2016-10-16 DIAGNOSIS — D509 Iron deficiency anemia, unspecified: Secondary | ICD-10-CM | POA: Diagnosis not present

## 2016-10-16 DIAGNOSIS — N2581 Secondary hyperparathyroidism of renal origin: Secondary | ICD-10-CM | POA: Diagnosis not present

## 2016-10-16 DIAGNOSIS — N186 End stage renal disease: Secondary | ICD-10-CM | POA: Diagnosis not present

## 2016-10-16 DIAGNOSIS — Z992 Dependence on renal dialysis: Secondary | ICD-10-CM | POA: Diagnosis not present

## 2016-10-18 DIAGNOSIS — N2581 Secondary hyperparathyroidism of renal origin: Secondary | ICD-10-CM | POA: Diagnosis not present

## 2016-10-18 DIAGNOSIS — D509 Iron deficiency anemia, unspecified: Secondary | ICD-10-CM | POA: Diagnosis not present

## 2016-10-18 DIAGNOSIS — N186 End stage renal disease: Secondary | ICD-10-CM | POA: Diagnosis not present

## 2016-10-18 DIAGNOSIS — Z992 Dependence on renal dialysis: Secondary | ICD-10-CM | POA: Diagnosis not present

## 2016-10-21 DIAGNOSIS — Z992 Dependence on renal dialysis: Secondary | ICD-10-CM | POA: Diagnosis not present

## 2016-10-21 DIAGNOSIS — N186 End stage renal disease: Secondary | ICD-10-CM | POA: Diagnosis not present

## 2016-10-21 DIAGNOSIS — D509 Iron deficiency anemia, unspecified: Secondary | ICD-10-CM | POA: Diagnosis not present

## 2016-10-21 DIAGNOSIS — N2581 Secondary hyperparathyroidism of renal origin: Secondary | ICD-10-CM | POA: Diagnosis not present

## 2016-10-23 DIAGNOSIS — N2581 Secondary hyperparathyroidism of renal origin: Secondary | ICD-10-CM | POA: Diagnosis not present

## 2016-10-23 DIAGNOSIS — D509 Iron deficiency anemia, unspecified: Secondary | ICD-10-CM | POA: Diagnosis not present

## 2016-10-23 DIAGNOSIS — N186 End stage renal disease: Secondary | ICD-10-CM | POA: Diagnosis not present

## 2016-10-23 DIAGNOSIS — Z992 Dependence on renal dialysis: Secondary | ICD-10-CM | POA: Diagnosis not present

## 2016-10-25 DIAGNOSIS — Z992 Dependence on renal dialysis: Secondary | ICD-10-CM | POA: Diagnosis not present

## 2016-10-25 DIAGNOSIS — N186 End stage renal disease: Secondary | ICD-10-CM | POA: Diagnosis not present

## 2016-10-25 DIAGNOSIS — N2581 Secondary hyperparathyroidism of renal origin: Secondary | ICD-10-CM | POA: Diagnosis not present

## 2016-10-25 DIAGNOSIS — D509 Iron deficiency anemia, unspecified: Secondary | ICD-10-CM | POA: Diagnosis not present

## 2016-10-26 DIAGNOSIS — Z992 Dependence on renal dialysis: Secondary | ICD-10-CM | POA: Diagnosis not present

## 2016-10-26 DIAGNOSIS — N186 End stage renal disease: Secondary | ICD-10-CM | POA: Diagnosis not present

## 2016-10-27 DIAGNOSIS — D509 Iron deficiency anemia, unspecified: Secondary | ICD-10-CM | POA: Diagnosis not present

## 2016-10-27 DIAGNOSIS — Z992 Dependence on renal dialysis: Secondary | ICD-10-CM | POA: Diagnosis not present

## 2016-10-27 DIAGNOSIS — N2581 Secondary hyperparathyroidism of renal origin: Secondary | ICD-10-CM | POA: Diagnosis not present

## 2016-10-27 DIAGNOSIS — N186 End stage renal disease: Secondary | ICD-10-CM | POA: Diagnosis not present

## 2016-10-28 DIAGNOSIS — D509 Iron deficiency anemia, unspecified: Secondary | ICD-10-CM | POA: Diagnosis not present

## 2016-10-28 DIAGNOSIS — Z992 Dependence on renal dialysis: Secondary | ICD-10-CM | POA: Diagnosis not present

## 2016-10-28 DIAGNOSIS — N186 End stage renal disease: Secondary | ICD-10-CM | POA: Diagnosis not present

## 2016-10-28 DIAGNOSIS — N2581 Secondary hyperparathyroidism of renal origin: Secondary | ICD-10-CM | POA: Diagnosis not present

## 2016-10-30 DIAGNOSIS — Z992 Dependence on renal dialysis: Secondary | ICD-10-CM | POA: Diagnosis not present

## 2016-10-30 DIAGNOSIS — D509 Iron deficiency anemia, unspecified: Secondary | ICD-10-CM | POA: Diagnosis not present

## 2016-10-30 DIAGNOSIS — N2581 Secondary hyperparathyroidism of renal origin: Secondary | ICD-10-CM | POA: Diagnosis not present

## 2016-10-30 DIAGNOSIS — N186 End stage renal disease: Secondary | ICD-10-CM | POA: Diagnosis not present

## 2016-11-01 DIAGNOSIS — Z992 Dependence on renal dialysis: Secondary | ICD-10-CM | POA: Diagnosis not present

## 2016-11-01 DIAGNOSIS — N186 End stage renal disease: Secondary | ICD-10-CM | POA: Diagnosis not present

## 2016-11-01 DIAGNOSIS — N2581 Secondary hyperparathyroidism of renal origin: Secondary | ICD-10-CM | POA: Diagnosis not present

## 2016-11-01 DIAGNOSIS — D509 Iron deficiency anemia, unspecified: Secondary | ICD-10-CM | POA: Diagnosis not present

## 2016-11-04 DIAGNOSIS — Z992 Dependence on renal dialysis: Secondary | ICD-10-CM | POA: Diagnosis not present

## 2016-11-04 DIAGNOSIS — N2581 Secondary hyperparathyroidism of renal origin: Secondary | ICD-10-CM | POA: Diagnosis not present

## 2016-11-04 DIAGNOSIS — D509 Iron deficiency anemia, unspecified: Secondary | ICD-10-CM | POA: Diagnosis not present

## 2016-11-04 DIAGNOSIS — N186 End stage renal disease: Secondary | ICD-10-CM | POA: Diagnosis not present

## 2016-11-05 DIAGNOSIS — N186 End stage renal disease: Secondary | ICD-10-CM | POA: Diagnosis not present

## 2016-11-05 DIAGNOSIS — Z992 Dependence on renal dialysis: Secondary | ICD-10-CM | POA: Diagnosis not present

## 2016-11-05 DIAGNOSIS — D509 Iron deficiency anemia, unspecified: Secondary | ICD-10-CM | POA: Diagnosis not present

## 2016-11-05 DIAGNOSIS — G629 Polyneuropathy, unspecified: Secondary | ICD-10-CM | POA: Diagnosis not present

## 2016-11-05 DIAGNOSIS — L97511 Non-pressure chronic ulcer of other part of right foot limited to breakdown of skin: Secondary | ICD-10-CM | POA: Diagnosis not present

## 2016-11-05 DIAGNOSIS — L97521 Non-pressure chronic ulcer of other part of left foot limited to breakdown of skin: Secondary | ICD-10-CM | POA: Diagnosis not present

## 2016-11-05 DIAGNOSIS — N2581 Secondary hyperparathyroidism of renal origin: Secondary | ICD-10-CM | POA: Diagnosis not present

## 2016-11-06 DIAGNOSIS — Z992 Dependence on renal dialysis: Secondary | ICD-10-CM | POA: Diagnosis not present

## 2016-11-06 DIAGNOSIS — N2581 Secondary hyperparathyroidism of renal origin: Secondary | ICD-10-CM | POA: Diagnosis not present

## 2016-11-06 DIAGNOSIS — D509 Iron deficiency anemia, unspecified: Secondary | ICD-10-CM | POA: Diagnosis not present

## 2016-11-06 DIAGNOSIS — N186 End stage renal disease: Secondary | ICD-10-CM | POA: Diagnosis not present

## 2016-11-08 DIAGNOSIS — Z992 Dependence on renal dialysis: Secondary | ICD-10-CM | POA: Diagnosis not present

## 2016-11-08 DIAGNOSIS — D509 Iron deficiency anemia, unspecified: Secondary | ICD-10-CM | POA: Diagnosis not present

## 2016-11-08 DIAGNOSIS — N2581 Secondary hyperparathyroidism of renal origin: Secondary | ICD-10-CM | POA: Diagnosis not present

## 2016-11-08 DIAGNOSIS — N186 End stage renal disease: Secondary | ICD-10-CM | POA: Diagnosis not present

## 2016-11-11 DIAGNOSIS — Z992 Dependence on renal dialysis: Secondary | ICD-10-CM | POA: Diagnosis not present

## 2016-11-11 DIAGNOSIS — N186 End stage renal disease: Secondary | ICD-10-CM | POA: Diagnosis not present

## 2016-11-11 DIAGNOSIS — D509 Iron deficiency anemia, unspecified: Secondary | ICD-10-CM | POA: Diagnosis not present

## 2016-11-11 DIAGNOSIS — N2581 Secondary hyperparathyroidism of renal origin: Secondary | ICD-10-CM | POA: Diagnosis not present

## 2016-11-13 DIAGNOSIS — D509 Iron deficiency anemia, unspecified: Secondary | ICD-10-CM | POA: Diagnosis not present

## 2016-11-13 DIAGNOSIS — N186 End stage renal disease: Secondary | ICD-10-CM | POA: Diagnosis not present

## 2016-11-13 DIAGNOSIS — Z992 Dependence on renal dialysis: Secondary | ICD-10-CM | POA: Diagnosis not present

## 2016-11-13 DIAGNOSIS — N2581 Secondary hyperparathyroidism of renal origin: Secondary | ICD-10-CM | POA: Diagnosis not present

## 2016-11-15 DIAGNOSIS — Z992 Dependence on renal dialysis: Secondary | ICD-10-CM | POA: Diagnosis not present

## 2016-11-15 DIAGNOSIS — N2581 Secondary hyperparathyroidism of renal origin: Secondary | ICD-10-CM | POA: Diagnosis not present

## 2016-11-15 DIAGNOSIS — N186 End stage renal disease: Secondary | ICD-10-CM | POA: Diagnosis not present

## 2016-11-15 DIAGNOSIS — D509 Iron deficiency anemia, unspecified: Secondary | ICD-10-CM | POA: Diagnosis not present

## 2016-11-18 DIAGNOSIS — N186 End stage renal disease: Secondary | ICD-10-CM | POA: Diagnosis not present

## 2016-11-18 DIAGNOSIS — D509 Iron deficiency anemia, unspecified: Secondary | ICD-10-CM | POA: Diagnosis not present

## 2016-11-18 DIAGNOSIS — Z992 Dependence on renal dialysis: Secondary | ICD-10-CM | POA: Diagnosis not present

## 2016-11-18 DIAGNOSIS — N2581 Secondary hyperparathyroidism of renal origin: Secondary | ICD-10-CM | POA: Diagnosis not present

## 2016-11-19 ENCOUNTER — Other Ambulatory Visit: Payer: Self-pay

## 2016-11-19 DIAGNOSIS — K219 Gastro-esophageal reflux disease without esophagitis: Secondary | ICD-10-CM | POA: Diagnosis not present

## 2016-11-19 DIAGNOSIS — N186 End stage renal disease: Secondary | ICD-10-CM | POA: Diagnosis not present

## 2016-11-19 DIAGNOSIS — I2510 Atherosclerotic heart disease of native coronary artery without angina pectoris: Secondary | ICD-10-CM | POA: Diagnosis not present

## 2016-11-19 DIAGNOSIS — I10 Essential (primary) hypertension: Secondary | ICD-10-CM | POA: Diagnosis not present

## 2016-11-19 DIAGNOSIS — Z1389 Encounter for screening for other disorder: Secondary | ICD-10-CM | POA: Diagnosis not present

## 2016-11-19 DIAGNOSIS — L97529 Non-pressure chronic ulcer of other part of left foot with unspecified severity: Secondary | ICD-10-CM | POA: Diagnosis not present

## 2016-11-19 DIAGNOSIS — L97519 Non-pressure chronic ulcer of other part of right foot with unspecified severity: Secondary | ICD-10-CM | POA: Diagnosis not present

## 2016-11-20 DIAGNOSIS — N186 End stage renal disease: Secondary | ICD-10-CM | POA: Diagnosis not present

## 2016-11-20 DIAGNOSIS — Z992 Dependence on renal dialysis: Secondary | ICD-10-CM | POA: Diagnosis not present

## 2016-11-20 DIAGNOSIS — D509 Iron deficiency anemia, unspecified: Secondary | ICD-10-CM | POA: Diagnosis not present

## 2016-11-20 DIAGNOSIS — N2581 Secondary hyperparathyroidism of renal origin: Secondary | ICD-10-CM | POA: Diagnosis not present

## 2016-11-22 DIAGNOSIS — N2581 Secondary hyperparathyroidism of renal origin: Secondary | ICD-10-CM | POA: Diagnosis not present

## 2016-11-22 DIAGNOSIS — N186 End stage renal disease: Secondary | ICD-10-CM | POA: Diagnosis not present

## 2016-11-22 DIAGNOSIS — Z992 Dependence on renal dialysis: Secondary | ICD-10-CM | POA: Diagnosis not present

## 2016-11-22 DIAGNOSIS — D509 Iron deficiency anemia, unspecified: Secondary | ICD-10-CM | POA: Diagnosis not present

## 2016-11-25 DIAGNOSIS — D509 Iron deficiency anemia, unspecified: Secondary | ICD-10-CM | POA: Diagnosis not present

## 2016-11-25 DIAGNOSIS — N2581 Secondary hyperparathyroidism of renal origin: Secondary | ICD-10-CM | POA: Diagnosis not present

## 2016-11-25 DIAGNOSIS — Z992 Dependence on renal dialysis: Secondary | ICD-10-CM | POA: Diagnosis not present

## 2016-11-25 DIAGNOSIS — N186 End stage renal disease: Secondary | ICD-10-CM | POA: Diagnosis not present

## 2016-11-26 DIAGNOSIS — N186 End stage renal disease: Secondary | ICD-10-CM | POA: Diagnosis not present

## 2016-11-26 DIAGNOSIS — Z992 Dependence on renal dialysis: Secondary | ICD-10-CM | POA: Diagnosis not present

## 2016-11-27 DIAGNOSIS — Z992 Dependence on renal dialysis: Secondary | ICD-10-CM | POA: Diagnosis not present

## 2016-11-27 DIAGNOSIS — R6883 Chills (without fever): Secondary | ICD-10-CM | POA: Diagnosis not present

## 2016-11-27 DIAGNOSIS — D509 Iron deficiency anemia, unspecified: Secondary | ICD-10-CM | POA: Diagnosis not present

## 2016-11-27 DIAGNOSIS — N186 End stage renal disease: Secondary | ICD-10-CM | POA: Diagnosis not present

## 2016-11-27 DIAGNOSIS — N2581 Secondary hyperparathyroidism of renal origin: Secondary | ICD-10-CM | POA: Diagnosis not present

## 2016-11-29 DIAGNOSIS — R6883 Chills (without fever): Secondary | ICD-10-CM | POA: Diagnosis not present

## 2016-11-29 DIAGNOSIS — N186 End stage renal disease: Secondary | ICD-10-CM | POA: Diagnosis not present

## 2016-11-29 DIAGNOSIS — N2581 Secondary hyperparathyroidism of renal origin: Secondary | ICD-10-CM | POA: Diagnosis not present

## 2016-11-29 DIAGNOSIS — Z992 Dependence on renal dialysis: Secondary | ICD-10-CM | POA: Diagnosis not present

## 2016-11-29 DIAGNOSIS — D509 Iron deficiency anemia, unspecified: Secondary | ICD-10-CM | POA: Diagnosis not present

## 2016-12-02 DIAGNOSIS — Z992 Dependence on renal dialysis: Secondary | ICD-10-CM | POA: Diagnosis not present

## 2016-12-02 DIAGNOSIS — D509 Iron deficiency anemia, unspecified: Secondary | ICD-10-CM | POA: Diagnosis not present

## 2016-12-02 DIAGNOSIS — R6883 Chills (without fever): Secondary | ICD-10-CM | POA: Diagnosis not present

## 2016-12-02 DIAGNOSIS — N186 End stage renal disease: Secondary | ICD-10-CM | POA: Diagnosis not present

## 2016-12-02 DIAGNOSIS — N2581 Secondary hyperparathyroidism of renal origin: Secondary | ICD-10-CM | POA: Diagnosis not present

## 2016-12-04 DIAGNOSIS — Z992 Dependence on renal dialysis: Secondary | ICD-10-CM | POA: Diagnosis not present

## 2016-12-04 DIAGNOSIS — D509 Iron deficiency anemia, unspecified: Secondary | ICD-10-CM | POA: Diagnosis not present

## 2016-12-04 DIAGNOSIS — N2581 Secondary hyperparathyroidism of renal origin: Secondary | ICD-10-CM | POA: Diagnosis not present

## 2016-12-04 DIAGNOSIS — R6883 Chills (without fever): Secondary | ICD-10-CM | POA: Diagnosis not present

## 2016-12-04 DIAGNOSIS — N186 End stage renal disease: Secondary | ICD-10-CM | POA: Diagnosis not present

## 2016-12-05 DIAGNOSIS — N186 End stage renal disease: Secondary | ICD-10-CM | POA: Diagnosis not present

## 2016-12-05 DIAGNOSIS — Z992 Dependence on renal dialysis: Secondary | ICD-10-CM | POA: Diagnosis not present

## 2016-12-05 DIAGNOSIS — N2581 Secondary hyperparathyroidism of renal origin: Secondary | ICD-10-CM | POA: Diagnosis not present

## 2016-12-05 DIAGNOSIS — D509 Iron deficiency anemia, unspecified: Secondary | ICD-10-CM | POA: Diagnosis not present

## 2016-12-05 DIAGNOSIS — R6883 Chills (without fever): Secondary | ICD-10-CM | POA: Diagnosis not present

## 2016-12-06 DIAGNOSIS — R6883 Chills (without fever): Secondary | ICD-10-CM | POA: Diagnosis not present

## 2016-12-06 DIAGNOSIS — Z992 Dependence on renal dialysis: Secondary | ICD-10-CM | POA: Diagnosis not present

## 2016-12-06 DIAGNOSIS — N186 End stage renal disease: Secondary | ICD-10-CM | POA: Diagnosis not present

## 2016-12-06 DIAGNOSIS — D509 Iron deficiency anemia, unspecified: Secondary | ICD-10-CM | POA: Diagnosis not present

## 2016-12-06 DIAGNOSIS — N2581 Secondary hyperparathyroidism of renal origin: Secondary | ICD-10-CM | POA: Diagnosis not present

## 2016-12-09 ENCOUNTER — Telehealth: Payer: Self-pay

## 2016-12-09 ENCOUNTER — Other Ambulatory Visit: Payer: Self-pay

## 2016-12-09 ENCOUNTER — Encounter (HOSPITAL_COMMUNITY): Payer: Self-pay | Admitting: *Deleted

## 2016-12-09 NOTE — Telephone Encounter (Signed)
Returned call to [employer] at [place] [facility] in [place].  Reported there was difficulty cannulating the left arm fistula this morning.  Noted a very faint bruit and thrill in the upper fistula site.  Stated they were only able to cannulate with one needle this AM, and couldn't get a 2nd needle placed.  Reported she feels that there is only a small segment of the fistula that is open.  Reported he was unable to dialyze today.  Advised to contact Interventional Radiology to sched. Poss Thrombolysis, as a first step, since the fistula is well established.  Advised to call office back if unable to sched. with IR.  Agreed.

## 2016-12-09 NOTE — Telephone Encounter (Signed)
-----   Message from Margy Clarks, LPN sent at 3/64/6803  9:12 AM EDT ----- Regarding: Pt unable to dialyze Seth Bake from [facility] called and stated this pt could not dialyze today. They think his graft is clotted. Does he just need apt with Dr or does he need an u/s too?  Thanks Ebony Hail

## 2016-12-10 ENCOUNTER — Ambulatory Visit (HOSPITAL_COMMUNITY)
Admission: RE | Admit: 2016-12-10 | Discharge: 2016-12-10 | Disposition: A | Discharge status: Home / Self Care | Payer: Medicare Other | Source: Ambulatory Visit | Attending: Vascular Surgery | Admitting: Vascular Surgery

## 2016-12-10 ENCOUNTER — Ambulatory Visit (HOSPITAL_COMMUNITY): Payer: Medicare Other | Admitting: Anesthesiology

## 2016-12-10 ENCOUNTER — Ambulatory Visit (HOSPITAL_COMMUNITY): Payer: Medicare Other

## 2016-12-10 ENCOUNTER — Encounter (HOSPITAL_COMMUNITY): Payer: Self-pay | Admitting: *Deleted

## 2016-12-10 ENCOUNTER — Encounter (HOSPITAL_COMMUNITY)
Admission: RE | Disposition: A | Discharge status: Home / Self Care | Payer: Self-pay | Source: Ambulatory Visit | Attending: Vascular Surgery

## 2016-12-10 DIAGNOSIS — Y832 Surgical operation with anastomosis, bypass or graft as the cause of abnormal reaction of the patient, or of later complication, without mention of misadventure at the time of the procedure: Secondary | ICD-10-CM | POA: Diagnosis not present

## 2016-12-10 DIAGNOSIS — T82868A Thrombosis of vascular prosthetic devices, implants and grafts, initial encounter: Secondary | ICD-10-CM | POA: Diagnosis not present

## 2016-12-10 DIAGNOSIS — I429 Cardiomyopathy, unspecified: Secondary | ICD-10-CM | POA: Diagnosis not present

## 2016-12-10 DIAGNOSIS — I120 Hypertensive chronic kidney disease with stage 5 chronic kidney disease or end stage renal disease: Secondary | ICD-10-CM | POA: Diagnosis not present

## 2016-12-10 DIAGNOSIS — Z87891 Personal history of nicotine dependence: Secondary | ICD-10-CM | POA: Diagnosis not present

## 2016-12-10 DIAGNOSIS — Z79899 Other long term (current) drug therapy: Secondary | ICD-10-CM | POA: Diagnosis not present

## 2016-12-10 DIAGNOSIS — M1990 Unspecified osteoarthritis, unspecified site: Secondary | ICD-10-CM | POA: Diagnosis not present

## 2016-12-10 DIAGNOSIS — Z992 Dependence on renal dialysis: Secondary | ICD-10-CM | POA: Insufficient documentation

## 2016-12-10 DIAGNOSIS — I509 Heart failure, unspecified: Secondary | ICD-10-CM | POA: Diagnosis not present

## 2016-12-10 DIAGNOSIS — Z8673 Personal history of transient ischemic attack (TIA), and cerebral infarction without residual deficits: Secondary | ICD-10-CM | POA: Insufficient documentation

## 2016-12-10 DIAGNOSIS — F419 Anxiety disorder, unspecified: Secondary | ICD-10-CM | POA: Diagnosis not present

## 2016-12-10 DIAGNOSIS — T82858A Stenosis of vascular prosthetic devices, implants and grafts, initial encounter: Secondary | ICD-10-CM | POA: Insufficient documentation

## 2016-12-10 DIAGNOSIS — Z419 Encounter for procedure for purposes other than remedying health state, unspecified: Secondary | ICD-10-CM

## 2016-12-10 DIAGNOSIS — N186 End stage renal disease: Secondary | ICD-10-CM | POA: Diagnosis not present

## 2016-12-10 DIAGNOSIS — Z95828 Presence of other vascular implants and grafts: Secondary | ICD-10-CM

## 2016-12-10 DIAGNOSIS — I132 Hypertensive heart and chronic kidney disease with heart failure and with stage 5 chronic kidney disease, or end stage renal disease: Secondary | ICD-10-CM | POA: Diagnosis not present

## 2016-12-10 DIAGNOSIS — Z452 Encounter for adjustment and management of vascular access device: Secondary | ICD-10-CM | POA: Diagnosis not present

## 2016-12-10 HISTORY — PX: FISTULOGRAM: SHX5832

## 2016-12-10 HISTORY — PX: INSERTION OF DIALYSIS CATHETER: SHX1324

## 2016-12-10 HISTORY — DX: Anxiety disorder, unspecified: F41.9

## 2016-12-10 LAB — POCT I-STAT 4, (NA,K, GLUC, HGB,HCT)
Glucose, Bld: 84 mg/dL (ref 65–99)
HCT: 44 % (ref 39.0–52.0)
Hemoglobin: 15 g/dL (ref 13.0–17.0)
Potassium: 4.2 mmol/L (ref 3.5–5.1)
Sodium: 140 mmol/L (ref 135–145)

## 2016-12-10 SURGERY — INSERTION OF DIALYSIS CATHETER
Anesthesia: Monitor Anesthesia Care | Site: Neck | Laterality: Left

## 2016-12-10 MED ORDER — PROPOFOL 500 MG/50ML IV EMUL
INTRAVENOUS | Status: DC | PRN
  Administered 2016-12-10: 75 ug/kg/min via INTRAVENOUS

## 2016-12-10 MED ORDER — PHENYLEPHRINE HCL 10 MG/ML IJ SOLN
INTRAVENOUS | Status: DC | PRN
  Administered 2016-12-10: 20 ug/min via INTRAVENOUS

## 2016-12-10 MED ORDER — HEPARIN SODIUM (PORCINE) 1000 UNIT/ML IJ SOLN
INTRAMUSCULAR | Status: DC | PRN
  Administered 2016-12-10: 1000 [IU] via INTRAVENOUS

## 2016-12-10 MED ORDER — OXYCODONE-ACETAMINOPHEN 5-325 MG PO TABS: 1.0000 | ORAL_TABLET | Freq: Four times a day (QID) | ORAL | 0 refills | Status: DC | PRN

## 2016-12-10 MED ORDER — PROPOFOL 10 MG/ML IV BOLUS
INTRAVENOUS | Status: AC
  Filled 2016-12-10: qty 20

## 2016-12-10 MED ORDER — IODIXANOL 320 MG/ML IV SOLN
INTRAVENOUS | Status: DC | PRN
  Administered 2016-12-10: 50 mL via INTRAVENOUS

## 2016-12-10 MED ORDER — DEXTROSE 5 % IV SOLN
1.5000 g | INTRAVENOUS | Status: AC
  Administered 2016-12-10: 1.5 g via INTRAVENOUS

## 2016-12-10 MED ORDER — LIDOCAINE-EPINEPHRINE (PF) 1 %-1:200000 IJ SOLN
INTRAMUSCULAR | Status: AC
  Filled 2016-12-10: qty 30

## 2016-12-10 MED ORDER — 0.9 % SODIUM CHLORIDE (POUR BTL) OPTIME
TOPICAL | Status: DC | PRN
  Administered 2016-12-10: 1000 mL

## 2016-12-10 MED ORDER — FENTANYL CITRATE (PF) 100 MCG/2ML IJ SOLN
INTRAMUSCULAR | Status: DC | PRN
  Administered 2016-12-10 (×2): 50 ug via INTRAVENOUS

## 2016-12-10 MED ORDER — HEPARIN SODIUM (PORCINE) 1000 UNIT/ML IJ SOLN
INTRAMUSCULAR | Status: DC | PRN
  Administered 2016-12-10: 5000 [IU] via INTRAVENOUS

## 2016-12-10 MED ORDER — MIDAZOLAM HCL 5 MG/5ML IJ SOLN
INTRAMUSCULAR | Status: DC | PRN
  Administered 2016-12-10: 1 mg via INTRAVENOUS

## 2016-12-10 MED ORDER — LIDOCAINE-EPINEPHRINE (PF) 1 %-1:200000 IJ SOLN
INTRAMUSCULAR | Status: DC | PRN
  Administered 2016-12-10: 30 mL

## 2016-12-10 MED ORDER — ONDANSETRON HCL 4 MG/2ML IJ SOLN: 4.0000 mg | Freq: Once | INTRAMUSCULAR | Status: DC | PRN

## 2016-12-10 MED ORDER — ARTIFICIAL TEARS OPHTHALMIC OINT
TOPICAL_OINTMENT | OPHTHALMIC | Status: AC
  Filled 2016-12-10: qty 3.5

## 2016-12-10 MED ORDER — HEPARIN SODIUM (PORCINE) 1000 UNIT/ML IJ SOLN
INTRAMUSCULAR | Status: AC
  Filled 2016-12-10: qty 1

## 2016-12-10 MED ORDER — SODIUM CHLORIDE 0.9 % IV SOLN
INTRAVENOUS | Status: DC | PRN
  Administered 2016-12-10: 09:00:00

## 2016-12-10 MED ORDER — SODIUM CHLORIDE 0.9 % IJ SOLN
INTRAMUSCULAR | Status: AC
  Filled 2016-12-10: qty 10

## 2016-12-10 MED ORDER — FENTANYL CITRATE (PF) 100 MCG/2ML IJ SOLN
25.0000 ug | INTRAMUSCULAR | Status: DC | PRN
  Administered 2016-12-10: 25 ug via INTRAVENOUS

## 2016-12-10 MED ORDER — MIDAZOLAM HCL 2 MG/2ML IJ SOLN
INTRAMUSCULAR | Status: AC
  Filled 2016-12-10: qty 2

## 2016-12-10 MED ORDER — PHENYLEPHRINE HCL 10 MG/ML IJ SOLN
INTRAMUSCULAR | Status: DC | PRN
  Administered 2016-12-10 (×2): 80 ug via INTRAVENOUS

## 2016-12-10 MED ORDER — CEFUROXIME SODIUM 1.5 G IV SOLR
INTRAVENOUS | Status: AC
  Filled 2016-12-10: qty 1.5

## 2016-12-10 MED ORDER — FENTANYL CITRATE (PF) 250 MCG/5ML IJ SOLN
INTRAMUSCULAR | Status: AC
  Filled 2016-12-10: qty 5

## 2016-12-10 MED ORDER — FENTANYL CITRATE (PF) 100 MCG/2ML IJ SOLN
INTRAMUSCULAR | Status: AC
  Administered 2016-12-10: 25 ug via INTRAVENOUS
  Filled 2016-12-10: qty 2

## 2016-12-10 MED ORDER — ONDANSETRON HCL 4 MG/2ML IJ SOLN
INTRAMUSCULAR | Status: AC
  Filled 2016-12-10: qty 2

## 2016-12-10 MED ORDER — PHENYLEPHRINE 40 MCG/ML (10ML) SYRINGE FOR IV PUSH (FOR BLOOD PRESSURE SUPPORT)
PREFILLED_SYRINGE | INTRAVENOUS | Status: AC
  Filled 2016-12-10: qty 10

## 2016-12-10 MED ORDER — PROPOFOL 10 MG/ML IV BOLUS
INTRAVENOUS | Status: DC | PRN
  Administered 2016-12-10: 25 mg via INTRAVENOUS

## 2016-12-10 MED ORDER — SODIUM CHLORIDE 0.9 % IV SOLN
INTRAVENOUS | Status: DC
  Administered 2016-12-10: 08:00:00 via INTRAVENOUS

## 2016-12-10 MED ORDER — CHLORHEXIDINE GLUCONATE CLOTH 2 % EX PADS: 6.0000 | MEDICATED_PAD | Freq: Once | CUTANEOUS | Status: DC

## 2016-12-10 SURGICAL SUPPLY — 56 items
ADH SKN CLS APL DERMABOND .7 (GAUZE/BANDAGES/DRESSINGS) ×4
BAG DECANTER FOR FLEXI CONT (MISCELLANEOUS) ×3 IMPLANT
BIOPATCH RED 1 DISK 7.0 (GAUZE/BANDAGES/DRESSINGS) ×3 IMPLANT
CATH ANGIO 5F BER2 65CM (CATHETERS) ×1 IMPLANT
CATH EMB 3FR 40CM (CATHETERS) ×1 IMPLANT
CATH EMB 4FR 40CM (CATHETERS) ×1 IMPLANT
CATH PALINDROME RT-P 15FX19CM (CATHETERS) IMPLANT
CATH PALINDROME RT-P 15FX23CM (CATHETERS) ×1 IMPLANT
CATH PALINDROME RT-P 15FX28CM (CATHETERS) IMPLANT
CATH PALINDROME RT-P 15FX55CM (CATHETERS) IMPLANT
COVER PROBE W GEL 5X96 (DRAPES) ×3 IMPLANT
COVER SURGICAL LIGHT HANDLE (MISCELLANEOUS) ×3 IMPLANT
DERMABOND ADVANCED (GAUZE/BANDAGES/DRESSINGS) ×2
DERMABOND ADVANCED .7 DNX12 (GAUZE/BANDAGES/DRESSINGS) ×2 IMPLANT
DEVICE TORQUE KENDALL .025-038 (MISCELLANEOUS) ×1 IMPLANT
DRAPE C-ARM 42X72 X-RAY (DRAPES) ×3 IMPLANT
DRAPE CHEST BREAST 15X10 FENES (DRAPES) ×3 IMPLANT
GAUZE SPONGE 2X2 8PLY NS (GAUZE/BANDAGES/DRESSINGS) ×1 IMPLANT
GAUZE SPONGE 4X4 12PLY STRL (GAUZE/BANDAGES/DRESSINGS) ×1 IMPLANT
GAUZE SPONGE 4X4 16PLY XRAY LF (GAUZE/BANDAGES/DRESSINGS) ×4 IMPLANT
GLOVE BIO SURGEON STRL SZ7.5 (GLOVE) ×3 IMPLANT
GLOVE BIOGEL PI IND STRL 6.5 (GLOVE) IMPLANT
GLOVE BIOGEL PI IND STRL 7.0 (GLOVE) IMPLANT
GLOVE BIOGEL PI INDICATOR 6.5 (GLOVE) ×3
GLOVE BIOGEL PI INDICATOR 7.0 (GLOVE) ×2
GOWN STRL REUS W/ TWL LRG LVL3 (GOWN DISPOSABLE) ×2 IMPLANT
GOWN STRL REUS W/ TWL XL LVL3 (GOWN DISPOSABLE) ×2 IMPLANT
GOWN STRL REUS W/TWL LRG LVL3 (GOWN DISPOSABLE) ×9
GOWN STRL REUS W/TWL XL LVL3 (GOWN DISPOSABLE) ×6
GUIDEWIRE ANGLED .035X150CM (WIRE) ×1 IMPLANT
KIT BASIN OR (CUSTOM PROCEDURE TRAY) ×3 IMPLANT
KIT ROOM TURNOVER OR (KITS) ×3 IMPLANT
NDL 18GX1X1/2 (RX/OR ONLY) (NEEDLE) ×2 IMPLANT
NDL HYPO 25GX1X1/2 BEV (NEEDLE) ×2 IMPLANT
NDL PERC 18GX7CM (NEEDLE) IMPLANT
NEEDLE 18GX1X1/2 (RX/OR ONLY) (NEEDLE) ×3 IMPLANT
NEEDLE HYPO 25GX1X1/2 BEV (NEEDLE) ×3 IMPLANT
NEEDLE PERC 18GX7CM (NEEDLE) ×3 IMPLANT
NS IRRIG 1000ML POUR BTL (IV SOLUTION) ×3 IMPLANT
PACK SURGICAL SETUP 50X90 (CUSTOM PROCEDURE TRAY) ×3 IMPLANT
PAD ARMBOARD 7.5X6 YLW CONV (MISCELLANEOUS) ×6 IMPLANT
SET MICROPUNCTURE 5F STIFF (MISCELLANEOUS) ×1 IMPLANT
SHEATH AVANTI 11CM 5FR (MISCELLANEOUS) ×1 IMPLANT
SOAP 2 % CHG 4 OZ (WOUND CARE) ×3 IMPLANT
SUT ETHILON 3 0 PS 1 (SUTURE) ×3 IMPLANT
SUT MNCRL AB 4-0 PS2 18 (SUTURE) ×3 IMPLANT
SUT PROLENE 5 0 C 1 24 (SUTURE) ×1 IMPLANT
SYR 10ML LL (SYRINGE) ×3 IMPLANT
SYR 20CC LL (SYRINGE) ×7 IMPLANT
SYR 5ML LL (SYRINGE) ×3 IMPLANT
SYR CONTROL 10ML LL (SYRINGE) ×3 IMPLANT
TAPE CLOTH SURG 4X10 WHT LF (GAUZE/BANDAGES/DRESSINGS) ×1 IMPLANT
TOWEL GREEN STERILE FF (TOWEL DISPOSABLE) ×3 IMPLANT
TOWEL OR 17X26 4PK STRL BLUE (TOWEL DISPOSABLE) ×3 IMPLANT
WATER STERILE IRR 1000ML POUR (IV SOLUTION) ×3 IMPLANT
WIRE AMPLATZ SS-J .035X180CM (WIRE) ×1 IMPLANT

## 2016-12-10 NOTE — Transfer of Care (Signed)
Immediate Anesthesia Transfer of Care Note  Patient: Joseph Maryland Sr.  Procedure(s) Performed: Procedure(s): INSERTION OF TUNNELED DIALYSIS CATHETER (Left) THROMBECTOMY OF LEFT ARM ARTERIOVENOUS FISTULA (Left)  Patient Location: PACU  Anesthesia Type:MAC  Level of Consciousness: awake, alert , oriented and patient cooperative  Airway & Oxygen Therapy: Patient Spontanous Breathing and Patient connected to face mask oxygen  Post-op Assessment: Report given to RN and Post -op Vital signs reviewed and stable  Post vital signs: Reviewed and stable  Last Vitals:  Vitals:   12/10/16 1049 12/10/16 1051  BP:  (!) 152/92  Pulse:    Resp:  13  Temp: 36.5 C   SpO2:      Last Pain:  Vitals:   12/10/16 0701  TempSrc: Oral      Patients Stated Pain Goal: 5 (21/30/86 5784)  Complications: No apparent anesthesia complications

## 2016-12-10 NOTE — Anesthesia Postprocedure Evaluation (Signed)
Anesthesia Post Note  Patient: Joseph ALBRECHT Sr.  Procedure(s) Performed: Procedure(s) (LRB): INSERTION OF TUNNELED DIALYSIS CATHETER (Left) THROMBECTOMY OF LEFT ARM ARTERIOVENOUS FISTULA (Left)     Patient location during evaluation: PACU Anesthesia Type: MAC Level of consciousness: awake and alert Pain management: pain level controlled Vital Signs Assessment: post-procedure vital signs reviewed and stable Respiratory status: spontaneous breathing, nonlabored ventilation, respiratory function stable and patient connected to nasal cannula oxygen Cardiovascular status: stable and blood pressure returned to baseline Anesthetic complications: no    Last Vitals:  Vitals:   12/10/16 1235 12/10/16 1315  BP: (!) 145/77 140/67  Pulse: 73 73  Resp: 18 18  Temp:    SpO2: 100% 100%    Last Pain:  Vitals:   12/10/16 1315  TempSrc:   PainSc: [place]

## 2016-12-10 NOTE — Anesthesia Preprocedure Evaluation (Addendum)
Anesthesia Evaluation  Patient identified by MRN, date of birth, ID band Patient awake    Reviewed: Allergy & Precautions, NPO status , Patient's Chart, lab work & pertinent test results  Airway Mallampati: II  TM Distance: >3 FB Neck ROM: Full    Dental  (+) Edentulous Upper, Edentulous Lower   Pulmonary former smoker,    breath sounds clear to auscultation       Cardiovascular hypertension, Pt. on medications + CAD and +CHF  + Valvular Problems/Murmurs AS  Rhythm:Regular Rate:Normal  05/2015 Echo: Left ventricle: The cavity size was mildly dilated. Wall  thickness was increased in a pattern of moderate LVH. There was mild focal basal hypertrophy of the septum. Systolic function was  normal. The estimated ejection fraction was in the range of 60  to 65%. Wall motion was normal; there were no regional wall   motion abnormalities. Doppler parameters are consistent with  abnormal left ventricular relaxation (grade 1 diastolic  dysfunction). - Aortic valve: There was moderate stenosis. There was trivial  regurgitation. Valve area (VTI): 0.89 cm^2. Valve area (Vmax):  1.02 cm^2. Valve area (Vmean): 0.91 cm^2. - Mitral valve: Calcified annulus. Mildly thickened leaflets .   There was mild regurgitation. - Left atrium: The atrium was moderately dilated.  Cath 06/2015 - Prox LAD lesion, 45% stenosed. There is moderate to severe left ventricular systolic dysfunction. EF 35%   Neuro/Psych Anxiety LLE weakness from CVA CVA, Residual Symptoms    GI/Hepatic Neg liver ROS, PUD,   Endo/Other  negative endocrine ROS  Renal/GU ESRF and DialysisRenal disease     Musculoskeletal  (+) Arthritis ,   Abdominal   Peds  Hematology negative hematology ROS (+)   Anesthesia Other Findings Last dialysis Friday, graft clotted yesterday  Reproductive/Obstetrics                            Lab Results  Component Value  Date   WBC 9.7 07/06/2015   HGB 18.7 (H) 02/08/2016   HCT 55.0 (H) 02/08/2016   MCV 64.9 (L) 07/06/2015   PLT 330 07/06/2015   Lab Results  Component Value Date   CREATININE 10.30 (H) 12/14/2015   BUN 34 (H) 12/14/2015   NA 135 02/08/2016   K 4.2 02/08/2016   CL 104 12/14/2015   CO2 21 07/06/2015    Anesthesia Physical  Anesthesia Plan  ASA: III  Anesthesia Plan: MAC   Post-op Pain Management:    Induction: Intravenous  PONV Risk Score and Plan: 1 and Treatment may vary due to age or medical condition  Airway Management Planned: Nasal Cannula  Additional Equipment:   Intra-op Plan:   Post-operative Plan:   Informed Consent: I have reviewed the patients History and Physical, chart, labs and discussed the procedure including the risks, benefits and alternatives for the proposed anesthesia with the patient or authorized representative who has indicated his/her understanding and acceptance.   Dental advisory given  Plan Discussed with: CRNA  Anesthesia Plan Comments:         Anesthesia Quick Evaluation

## 2016-12-10 NOTE — H&P (Signed)
HP    History of Present Illness: This is a 76 y.o. male with most recently a left brachial vein transposition av fistula. 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 He also has a history of ulceration on his toes requiring angiogram with intervention. He now has occluded his left arm avf and is in need of access.  Past Medical History:  Diagnosis Date  . Anxiety   . Arthritis   . Bifascicular block   . Cardiomyopathy ([place])   . CVA (cerebral infarction)   . Dialysis patient ([place])   . ESRD (end stage renal disease) on dialysis [facility])    M/W/F at [facility] in [place]  . Gastric ulcer 2004   treated for h.pylori see PSH  . Hypertension   . Low iron   . LVH (left ventricular hypertrophy)   . Stroke [facility])    left side weakness    Past Surgical History:  Procedure Laterality Date  . ABDOMINAL AORTAGRAM N/A 01/24/2012   Procedure: ABDOMINAL Maxcine Ham;  Surgeon: Elam Dutch, MD;  Location: [facility] CATH LAB;  Service: Cardiovascular;  Laterality: N/A;  . ARTERIOVENOUS GRAFT PLACEMENT Right right arm  . AV FISTULA PLACEMENT Left 08/31/2015   Procedure: ARTERIOVENOUS (AV) FISTULA CREATION- LEFT RADIOCEPHALIC;  Surgeon: Mal Misty, MD;  Location: [place];  Service: Vascular;  Laterality: Left;  . BASCILIC VEIN TRANSPOSITION Left 12/19/2015   Procedure: FIRST STAGE BRACHIAL VEIN TRANSPOSITION;  Surgeon: Conrad [place], MD;  Location: [facility];  Service: Vascular;  Laterality: Left;  . BASCILIC VEIN TRANSPOSITION Left 02/08/2016   Procedure: SECOND STAGE BRACHIAL VEIN TRANSPOSITION;  Surgeon: Conrad Hubbard, MD;  Location: [place];  Service: Vascular;  Laterality: Left;  . CARDIAC CATHETERIZATION N/A 07/11/2015   Procedure: Left Heart Cath and Coronary Angiography;  Surgeon: Troy Sine, MD;  Location: [place] CV LAB;  Service: Cardiovascular;  Laterality: N/A;  . Carpel Tunnel Left Dec. 22, 2016  . CHOLECYSTECTOMY    . COLONOSCOPY  2004     Dr. Irving Shows, left sided diverticula and cecal polyp, path unknown  . COLONOSCOPY  10/29/2011   Procedure: COLONOSCOPY;  Surgeon: Daneil Dolin, MD;  [facility];  Service: Endoscopy;  Laterality: N/A;  10:15  . ESOPHAGOGASTRODUODENOSCOPY  11/2002   Dr. Gala Romney, erosive reflux esophagitis, multiple gastric ulcer and antral/bulbar erosions. Serologies positive for H.Pylori and was treated  . ESOPHAGOGASTRODUODENOSCOPY  11/20014   Dr. Gala Romney, small hh only, ulcers healed  . ESOPHAGOGASTRODUODENOSCOPY  09/21/2011   Dr Trevor Iha [facility], antral erosions, ?early GAVE  . IR GENERIC HISTORICAL  07/16/2016   IR REMOVAL TUN CV CATH W/O FL 07/16/2016 Saverio Danker, PA-C [facility]-INTER[facility] RAD  . LIGATION OF ARTERIOVENOUS  FISTULA Left 12/19/2015   Procedure: LIGATION OF RADIOCEPHALIC ARTERIOVENOUS  FISTULA;  Surgeon: Conrad Rosendale, MD;  Location: [place];  Service: Vascular;  Laterality: Left;  . PERIPHERAL VASCULAR CATHETERIZATION N/A 12/14/2015   Procedure: Fistulagram;  Surgeon: Conrad , MD;  Location: [place] CV LAB;  Service: Cardiovascular;  Laterality: N/A;  . SHOULDER SURGERY      Allergies  Allergen Reactions  . Aspirin Other (See Comments)    INTERNAL BLEEDING    Prior to Admission medications   Medication Sig Start Date End Date Taking? Authorizing Provider  ALPRAZolam Duanne Moron) 0.5 MG tablet Take 0.5 mg by mouth daily. 10/06/15  Yes [provider]  atorvastatin (LIPITOR) 40 MG tablet Take 40  mg by mouth daily. 11/03/15  Yes [provider]  b complex-vitamin c-folic acid (NEPHRO-VITE) 0.8 MG TABS Take 0.8 mg by mouth See admin instructions. Takes on Tuesdays, Thursdays, Saturdays, and Sundays. Does not take on Mondays, Wednesdays, and Fridays due to Dialysis treatments.   Yes [provider]  cinacalcet (SENSIPAR) 60 MG tablet Take 60 mg by mouth every evening. With evening meal   Yes [provider]  loratadine (CLARITIN) 10 MG tablet Take 1 tablet  (10 mg total) by mouth every other day as needed for allergies. 11/08/15  Yes [name], MD  meclizine (ANTIVERT) 25 MG tablet Take 25 mg by mouth daily as needed for dizziness (Non dialysis days).    Yes [provider]  promethazine (PHENERGAN) 25 MG tablet Take 25 mg by mouth every 8 (eight) hours as needed for nausea or vomiting.  08/03/10  Yes [provider]  sevelamer (RENVELA) 800 MG tablet Take 800-2,400 mg by mouth See admin instructions. Take 3200  mg by mouth 3 times daily with meals and take 800 mg by mouth with snacks.   Yes [provider]    Social History   Social History  . Marital status: Single    Spouse name: N/A  . Number of children: 2  . Years of education: N/A   Occupational History  . retired, [employer] Retired   Social History Main Topics  . Smoking status: Former Smoker    Quit date: 04/29/2004  . Smokeless tobacco: Former Systems developer    Types: [place] date: 01/16/1987     Comment: quit 2006  . Alcohol use No  . Drug use: No  . Sexual activity: Not on file   Other Topics Concern  . Not on file   Social History Narrative   Lives alone   Daughter 20-min away   Caffeine use: 32oz soda per day     Family History  Problem Relation Age of Onset  . Hypertension Mother   . Colon cancer Neg Hx   . Liver disease Neg Hx     ROS: no complaints today   Physical Examination  Vitals:   12/10/16 0701  BP: (!) 166/97  Pulse: 88  Resp: 18  Temp: 97.7 F (36.5 C)  SpO2: 99%   Body mass index is 28.29 kg/m.  General:  WDWN in NAD HENT: WNL, normocephalic Pulmonary: normal non-labored breathing, without Rales, rhonchi,  wheezing Cardiac: palpable radial pulses, no palpable popliteal pulses Abdomen:soft, NT/ND, no masses Extremities: left upper arm has no thrill, pulsatility proximally in avf Neurologic: A&O X 3 ; SENSATION: normal; MOTOR FUNCTION:  moving all extremities equally. Speech is fluent/normal Psychiatric:   Appropriate mood and affect  CBC    Component Value Date/Time   WBC 9.7 07/06/2015 1158   RBC 6.83 (H) 07/06/2015 1158   HGB 18.7 (H) 02/08/2016 0842   HCT 55.0 (H) 02/08/2016 0842   PLT 330 07/06/2015 1158   MCV 64.9 (L) 07/06/2015 1158   MCH 20.8 (L) 07/06/2015 1158   MCHC 32.1 07/06/2015 1158   RDW 21.1 (H) 07/06/2015 1158   LYMPHSABS 2.3 03/04/2015 2322   MONOABS 0.9 03/04/2015 2322   EOSABS 0.8 (H) 03/04/2015 2322   BASOSABS 0.0 03/04/2015 2322    BMET    Component Value Date/Time   NA 135 02/08/2016 0842   K 4.2 02/08/2016 0842   CL 104 12/14/2015 0745   CO2 21 07/06/2015 1158   GLUCOSE 92 02/08/2016  1791   BUN 34 (H) 12/14/2015 0745   CREATININE 10.30 (H) 12/14/2015 0745   CREATININE 10.80 (H) 07/06/2015 1158   CALCIUM 9.5 07/06/2015 1158   GFRNONAA 5 (L) 03/04/2015 2322   GFRAA 5 (L) 03/04/2015 2322    COAGS: Lab Results  Component Value Date   INR 1.02 07/06/2015   INR 1.00 09/19/2011   INR 0.95 07/13/2010     ASSESSMENT/PLAN: This is a 76 y.o. male with occluded brachial vein transposition avf by physical exam in need of temporary access. Will place tdc and consider thrombectomy of left arm avf. Will need definitive access planning to follow.   Brandon C. Donzetta Matters, MD [facility] [place] Office: [phone] Pager: [phone]

## 2016-12-10 NOTE — Progress Notes (Signed)
Patient states his ride home and the person is going to stay with him overnight does not get off work until 3 pm. [name] in PACU notified.

## 2016-12-10 NOTE — Progress Notes (Signed)
Dr. Fransisco Beau notified of EKG.

## 2016-12-10 NOTE — Op Note (Signed)
Patient name: Joseph CHICOINE Sr. MRN: [id number] DOB: 1940-07-19 Sex: male  12/10/2016 Pre-operative Diagnosis: esrd, clotted left arm av fistula Post-operative diagnosis:  Same Surgeon:  Eda Paschal. Donzetta Matters, MD Assistant: Silva Bandy, PA Procedure Performed: 1.  US guided cannulation of left IJ 2.  Central venogram 3.  Placement of left IJ 23cm tunneled dialysis catheter 4.  Left arm brachial vein av fistula thrombectomy  Indications:  76 year old male on dialysis via left arm brachial vein AV fistula that has been transposed. He has had recent difficulty with dialysis and the day prior to this procedure was noted to have no palpable thrill within his access. He is therefore indicated for placement of tunneled catheter with possible fistulogram left arm and possible  Findings: The left IJ has a tight stenosis centrally but he appears to have a patent left innominate vein and superior vena cava. The 23 cm catheter was placed into the SVC. Following thrombectomy of the left arm fistula and there was no backbleeding and at completion there was a multiphasic signal at the level of the radial artery at the wrist and a waterhammer in the fistula itself.   Procedure:  The patient was identified in the holding area and taken to the operating room where he was placed supine on the operating table and Mac anesthesia induced. He was sterilely prepped and draped in his bilateral neck and chest area given antibiotics and timeout called. Patient was ultrasound to evaluate the right neck where there was no discernible IJ that had patency. On the left neck the IJ was large however noncompressible. This was cannulated initially with 18-gauge needle but the J-wire would not pass. This was removed and pressure held for 2 minutes. I then cannulated with an 21-gauge needle and passed a microwire although this too would not pass I was able to under fluoroscopy get a micropuncture sheath in place. Central venogram was  performed and demonstrated patency of the central veins. Using fluoroscopy I then passed a Glidewire centrally and then exchanged for short 5 [place] sheath. Using the catheter I directed the wire into the IVC and then exchanged for Amplatz wire. At this time a counterincision was made and 23 cm catheter was tunneled to the neck. The tract was serially dilated and the introducer sheath placed under fluoroscopy into the SVC. Upon introducing the catheter the introducer did withdraw and the catheter itself went into the right innominate vein upon insertion. I then retracted into the left innominate vein and used a Glidewire to directed caudally into the SVC. At this time it was trimmed to size and assembled flushed with heparinized saline followed by heparin. I affix it to the skin with 3-0 nylon and the neck incision was closed with 4-0 Monocryl. Dermabond was applied here as well as Biopatch and sterile dressing.  We then prepped and draped the left arm again timed out. The fistula itself had pulsatility proximally and a transverse incision was made overlying this. We dissected out the fistula placed vessel loop around it. The patient was given 5000 units of heparin and after surgery for 2 minutes the fistula was clamped proximally. I opened longitudinally and there was no bleeding at that time although there was notable clot. This time I passed initially a 3 Fogarty followed by 4 Fogarty and removed significant clot however I never had adequate backbleeding and it was very difficult to pass the Fogarty's. I then opened the clamp and did a strong antegrade bleeding. This I  instilled heparinized saline in the fistula after re-clamping proximally. I then closed with 5-0 Prolene suture in a running fashion. Doppler demonstrated a waterhammer signal within the fistula which is likely thrombosed. There was a multiphasic signal at the radial artery at the wrist. Satisfied with this we irrigated the wound closed in 2  layers with Vicryl and Monocryl. Patient tolerated procedure well without immediate complication.    Brand on C. Donzetta Matters, MD [facility] [place] Office: [phone] Pager: [phone]

## 2016-12-10 NOTE — Discharge Instructions (Signed)
° °  [facility]  Discharge Instructions  AV Fistula or Graft Surgery for Dialysis Access  Please refer to the following instructions for your post-procedure care. Your surgeon or physician assistant will discuss any changes with you.  Activity  You may drive the day following your surgery, if you are comfortable and no longer taking prescription pain medication. Resume full activity as the soreness in your incision resolves.  Bathing/Showering  You may shower after you go home. Keep your incision dry for 48 hours. Do not soak in a bathtub, hot tub, or swim until the incision heals completely. You may not shower if you have a hemodialysis catheter.  Incision Care  Clean your incision with mild soap and water after 48 hours. Pat the area dry with a clean towel. You do not need a bandage unless otherwise instructed. Do not apply any ointments or creams to your incision. You may have skin glue on your incision. Do not peel it off. It will come off on its own in about one week. Your arm may swell a bit after surgery. To reduce swelling use pillows to elevate your arm so it is above your heart. Your doctor will tell you if you need to lightly wrap your arm with an ACE bandage.  You may not take a shower with a dialysis catheter.   Diet  Resume your normal diet. There are not special food restrictions following this procedure. In order to heal from your surgery, it is CRITICAL to get adequate nutrition. Your body requires vitamins, minerals, and protein. Vegetables are the best source of vitamins and minerals. Vegetables also provide the perfect balance of protein. Processed food has little nutritional value, so try to avoid this.  Medications  Resume taking all of your medications. If your incision is causing pain, you may take over-the counter pain relievers such as acetaminophen (Tylenol). If you were prescribed a stronger pain medication, please be aware these  medications can cause nausea and constipation. Prevent nausea by taking the medication with a snack or meal. Avoid constipation by drinking plenty of fluids and eating foods with high amount of fiber, such as fruits, vegetables, and grains. Do not take Tylenol if you are taking prescription pain medications.  Follow up Your surgeon may want to see you in the office following your access surgery. If so, this will be arranged at the time of your surgery.  Please call us immediately for any of the following conditions:  Increased pain, redness, drainage (pus) from your incision site Fever of 101 degrees or higher Severe or worsening pain at your incision site Hand pain or numbness.  Reduce your risk of vascular disease:  Stop smoking. If you would like help, call [facility] at 1-800-QUIT-NOW (309) 397-4031) or [place] at [place] your cholesterol Maintain a desired weight Control your diabetes Keep your blood pressure down  Dialysis  It will take several weeks to several months for your new dialysis access to be ready for use. Your surgeon will determine when it is OK to use it. Your nephrologist will continue to direct your dialysis. You can continue to use your Permcath until your new access is ready for use.   12/10/2016 [place] [id number] 1940-05-05  Surgeon(s): Waynetta Sandy, MD  Procedure(s): INSERTION OF TUNNELED DIALYSIS CATHETER THROMBECTOMY OF LEFT ARM ARTERIOVENOUS FISTULA    If you have any questions, please call the office at [phone].

## 2016-12-11 ENCOUNTER — Other Ambulatory Visit: Payer: Self-pay

## 2016-12-11 ENCOUNTER — Encounter (HOSPITAL_COMMUNITY): Payer: Self-pay | Admitting: Vascular Surgery

## 2016-12-11 DIAGNOSIS — D509 Iron deficiency anemia, unspecified: Secondary | ICD-10-CM | POA: Diagnosis not present

## 2016-12-11 DIAGNOSIS — N186 End stage renal disease: Secondary | ICD-10-CM | POA: Diagnosis not present

## 2016-12-11 DIAGNOSIS — Z992 Dependence on renal dialysis: Secondary | ICD-10-CM | POA: Diagnosis not present

## 2016-12-11 DIAGNOSIS — N2581 Secondary hyperparathyroidism of renal origin: Secondary | ICD-10-CM | POA: Diagnosis not present

## 2016-12-11 DIAGNOSIS — R6883 Chills (without fever): Secondary | ICD-10-CM | POA: Diagnosis not present

## 2016-12-13 DIAGNOSIS — R6883 Chills (without fever): Secondary | ICD-10-CM | POA: Diagnosis not present

## 2016-12-13 DIAGNOSIS — D509 Iron deficiency anemia, unspecified: Secondary | ICD-10-CM | POA: Diagnosis not present

## 2016-12-13 DIAGNOSIS — N186 End stage renal disease: Secondary | ICD-10-CM | POA: Diagnosis not present

## 2016-12-13 DIAGNOSIS — N2581 Secondary hyperparathyroidism of renal origin: Secondary | ICD-10-CM | POA: Diagnosis not present

## 2016-12-13 DIAGNOSIS — Z992 Dependence on renal dialysis: Secondary | ICD-10-CM | POA: Diagnosis not present

## 2016-12-16 DIAGNOSIS — D509 Iron deficiency anemia, unspecified: Secondary | ICD-10-CM | POA: Diagnosis not present

## 2016-12-16 DIAGNOSIS — Z992 Dependence on renal dialysis: Secondary | ICD-10-CM | POA: Diagnosis not present

## 2016-12-16 DIAGNOSIS — N186 End stage renal disease: Secondary | ICD-10-CM | POA: Diagnosis not present

## 2016-12-16 DIAGNOSIS — R6883 Chills (without fever): Secondary | ICD-10-CM | POA: Diagnosis not present

## 2016-12-16 DIAGNOSIS — N2581 Secondary hyperparathyroidism of renal origin: Secondary | ICD-10-CM | POA: Diagnosis not present

## 2016-12-17 ENCOUNTER — Encounter (HOSPITAL_COMMUNITY): Payer: Self-pay | Admitting: Surgery

## 2016-12-17 ENCOUNTER — Other Ambulatory Visit: Payer: Self-pay

## 2016-12-17 ENCOUNTER — Encounter (HOSPITAL_COMMUNITY)
Admission: RE | Disposition: A | Discharge status: Home / Self Care | Payer: Self-pay | Source: Ambulatory Visit | Attending: Surgery

## 2016-12-17 ENCOUNTER — Ambulatory Visit (HOSPITAL_COMMUNITY)
Admission: RE | Admit: 2016-12-17 | Discharge: 2016-12-17 | Disposition: A | Discharge status: Home / Self Care | Payer: Medicare Other | Source: Ambulatory Visit | Attending: Surgery | Admitting: Surgery

## 2016-12-17 DIAGNOSIS — N186 End stage renal disease: Secondary | ICD-10-CM | POA: Diagnosis not present

## 2016-12-17 DIAGNOSIS — Z992 Dependence on renal dialysis: Principal | ICD-10-CM

## 2016-12-17 DIAGNOSIS — I871 Compression of vein: Secondary | ICD-10-CM | POA: Diagnosis not present

## 2016-12-17 DIAGNOSIS — N185 Chronic kidney disease, stage 5: Secondary | ICD-10-CM | POA: Diagnosis not present

## 2016-12-17 DIAGNOSIS — Z48812 Encounter for surgical aftercare following surgery on the circulatory system: Secondary | ICD-10-CM

## 2016-12-17 HISTORY — PX: UPPER EXTREMITY VENOGRAPHY: CATH118272

## 2016-12-17 LAB — POCT I-STAT, CHEM 8
BUN: 30 mg/dL — AB (ref 6–20)
CREATININE: 9.7 mg/dL — AB (ref 0.61–1.24)
Calcium, Ion: 0.86 mmol/L — CL (ref 1.15–1.40)
Chloride: 104 mmol/L (ref 101–111)
GLUCOSE: 89 mg/dL (ref 65–99)
HCT: 45 % (ref 39.0–52.0)
HEMOGLOBIN: 15.3 g/dL (ref 13.0–17.0)
Potassium: 5.9 mmol/L — ABNORMAL HIGH (ref 3.5–5.1)
Sodium: 134 mmol/L — ABNORMAL LOW (ref 135–145)
TCO2: 26 mmol/L (ref 0–100)

## 2016-12-17 SURGERY — UPPER EXTREMITY VENOGRAPHY
Anesthesia: LOCAL | Laterality: Bilateral

## 2016-12-17 MED ORDER — SODIUM CHLORIDE 0.9% FLUSH: 3.0000 mL | INTRAVENOUS | Status: DC | PRN

## 2016-12-17 MED ORDER — SODIUM CHLORIDE 0.9 % IV SOLN: 250.0000 mL | INTRAVENOUS | Status: DC | PRN

## 2016-12-17 MED ORDER — IODIXANOL 320 MG/ML IV SOLN
INTRAVENOUS | Status: DC | PRN
  Administered 2016-12-17: 50 mL via INTRAVENOUS

## 2016-12-17 MED ORDER — SODIUM CHLORIDE 0.9% FLUSH: 3.0000 mL | Freq: Two times a day (BID) | INTRAVENOUS | Status: DC

## 2016-12-17 MED ORDER — LABETALOL HCL 5 MG/ML IV SOLN: 10.0000 mg | INTRAVENOUS | Status: DC | PRN

## 2016-12-17 MED ORDER — HYDRALAZINE HCL 20 MG/ML IJ SOLN: 5.0000 mg | INTRAMUSCULAR | Status: DC | PRN

## 2016-12-17 SURGICAL SUPPLY — 2 items
STOPCOCK MORSE 400PSI 3WAY (MISCELLANEOUS) ×1 IMPLANT
TUBING CIL FLEX 10 FLL-RA (TUBING) ×1 IMPLANT

## 2016-12-17 NOTE — Discharge Instructions (Signed)
Venogram, Care After °This sheet gives you information about how to care for yourself after your procedure. Your health care provider may also give you more specific instructions. If you have problems or questions, contact your health care provider. °What can I expect after the procedure? °After the procedure, it is common to have: °· Bruising or mild discomfort in the area where the IV was inserted (insertion site). ° °Follow these instructions at home: °Eating and drinking °· Follow instructions from your health care provider about eating or drinking restrictions. °· Drink a lot of fluids for the first several days after the procedure, as directed by your health care provider. This helps to wash (flush) the contrast out of your body. Examples of healthy fluids include water or low-calorie drinks. °General instructions °· Check your IV insertion area every day for signs of infection. Check for: °? Redness, swelling, or pain. °? Fluid or blood. °? Warmth. °? Pus or a bad smell. °· Take over-the-counter and prescription medicines only as told by your health care provider. °· Rest and return to your normal activities as told by your health care provider. Ask your health care provider what activities are safe for you. °· Do not drive for 24 hours if you were given a medicine to help you relax (sedative), or until your health care provider approves. °· Keep all follow-up visits as told by your health care provider. This is important. °Contact a health care provider if: °· Your skin becomes itchy or you develop a rash or hives. °· You have a fever that does not get better with medicine. °· You feel nauseous. °· You vomit. °· You have redness, swelling, or pain around the insertion site. °· You have fluid or blood coming from the insertion site. °· Your insertion area feels warm to the touch. °· You have pus or a bad smell coming from the insertion site. °Get help right away if: °· You have difficulty breathing or  shortness of breath. °· You develop chest pain. °· You faint. °· You feel very dizzy. °These symptoms may represent a serious problem that is an emergency. Do not wait to see if the symptoms will go away. Get medical help right away. Call your local emergency services (911 in the [place] Do not drive yourself to the hospital. °Summary °· After your procedure, it is common to have bruising or mild discomfort in the area where the IV was inserted. °· You should check your IV insertion area every day for signs of infection. °· Take over-the-counter and prescription medicines only as told by your health care provider. °· You should drink a lot of fluids for the first several days after the procedure to help flush the contrast from your body. °This information is not intended to replace advice given to you by your health care provider. Make sure you discuss any questions you have with your health care provider. °Document Released: 02/03/2013 Document Revised: 03/09/2016 Document Reviewed: 03/09/2016 °Elsevier Interactive Patient Education © 2017 Elsevier Inc. ° °

## 2016-12-17 NOTE — H&P (Signed)
   Patient name: [name]. MRN: [id number] DOB: 11-20-1940 Sex: male   HISTORY OF PRESENT ILLNESS:   Roverto Bodmer. is a 76 y.o. male who recently had a left brachiocephalic fistula ligated, is here today for access planning.  CURRENT MEDICATIONS:    Current Facility-Administered Medications  Medication Dose Route Frequency Provider Last Rate Last Dose  . sodium chloride flush (NS) 0.9 % injection 3 mL  3 mL Intravenous PRN Serafina Mitchell, MD        REVIEW OF SYSTEMS:   [X]  denotes positive finding, [ ]  denotes negative finding Cardiac  Comments:  Chest pain or chest pressure:    Shortness of breath upon exertion:    Short of breath when lying flat:    Irregular heart rhythm:    Constitutional    Fever or chills:      PHYSICAL EXAM:   Vitals:   12/17/16 0610  BP: (!) 155/98  Pulse: 95  Resp: 18  Temp: 98.1 F (36.7 C)  TempSrc: Oral  SpO2: 100%  Weight: 170 lb (77.1 kg)  Height: 5\' 5"  (1.651 m)    GENERAL: The patient is a well-nourished male, in no acute distress. The vital signs are documented above. CARDIOVASCULAR: There is a regular rate and rhythm. PULMONARY: Non-labored respirations   STUDIES:   none   MEDICAL ISSUES:   Bilateral upper extremity venogram will be performed to determine access management.  Annamarie Major, MD [facility] [phone] Pager [phone]

## 2016-12-17 NOTE — Op Note (Signed)
    Patient name: Joseph SHIMEL Sr. MRN: [id number] DOB: Dec 13, 1940 Sex: male  12/17/2016 Pre-operative Diagnosis: ESRD Post-operative diagnosis:  Same Surgeon:  Annamarie Major Procedure Performed:  1.  Bilateral venogram    Indications:  Patient is here for access planning  Procedure:  The patient was identified in the holding area and taken to room 8. Bilateral peripheral IV's were placed in the holding area and contrast injections were performed  Findings:  There appears to be luminal narrowing within the left subclavian vein with possible significant stenosis identified, however this doesn't appear to be patent.  On the right side, no definitive stenosis is identified.  There is sluggish opacification of the central venous system, possibly related to the existing dialysis catheter.   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  No definitive stenosis was identified.    Theotis Burrow, M.D. [facility] [place] Office: [phone] Pager:  [phone]

## 2016-12-18 DIAGNOSIS — Z992 Dependence on renal dialysis: Secondary | ICD-10-CM | POA: Diagnosis not present

## 2016-12-18 DIAGNOSIS — N2581 Secondary hyperparathyroidism of renal origin: Secondary | ICD-10-CM | POA: Diagnosis not present

## 2016-12-18 DIAGNOSIS — R6883 Chills (without fever): Secondary | ICD-10-CM | POA: Diagnosis not present

## 2016-12-18 DIAGNOSIS — N186 End stage renal disease: Secondary | ICD-10-CM | POA: Diagnosis not present

## 2016-12-18 DIAGNOSIS — D509 Iron deficiency anemia, unspecified: Secondary | ICD-10-CM | POA: Diagnosis not present

## 2016-12-20 DIAGNOSIS — R6883 Chills (without fever): Secondary | ICD-10-CM | POA: Diagnosis not present

## 2016-12-20 DIAGNOSIS — N186 End stage renal disease: Secondary | ICD-10-CM | POA: Diagnosis not present

## 2016-12-20 DIAGNOSIS — N2581 Secondary hyperparathyroidism of renal origin: Secondary | ICD-10-CM | POA: Diagnosis not present

## 2016-12-20 DIAGNOSIS — Z992 Dependence on renal dialysis: Secondary | ICD-10-CM | POA: Diagnosis not present

## 2016-12-20 DIAGNOSIS — D509 Iron deficiency anemia, unspecified: Secondary | ICD-10-CM | POA: Diagnosis not present

## 2016-12-23 DIAGNOSIS — N2581 Secondary hyperparathyroidism of renal origin: Secondary | ICD-10-CM | POA: Diagnosis not present

## 2016-12-23 DIAGNOSIS — N186 End stage renal disease: Secondary | ICD-10-CM | POA: Diagnosis not present

## 2016-12-23 DIAGNOSIS — Z992 Dependence on renal dialysis: Secondary | ICD-10-CM | POA: Diagnosis not present

## 2016-12-23 DIAGNOSIS — R6883 Chills (without fever): Secondary | ICD-10-CM | POA: Diagnosis not present

## 2016-12-23 DIAGNOSIS — D509 Iron deficiency anemia, unspecified: Secondary | ICD-10-CM | POA: Diagnosis not present

## 2016-12-25 ENCOUNTER — Encounter (HOSPITAL_COMMUNITY): Payer: Self-pay | Admitting: Emergency Medicine

## 2016-12-25 ENCOUNTER — Emergency Department (HOSPITAL_COMMUNITY)
Admission: EM | Admit: 2016-12-25 | Discharge: 2016-12-25 | Disposition: A | Discharge status: Home / Self Care | Payer: Medicare Other | Attending: Emergency Medicine | Admitting: Emergency Medicine

## 2016-12-25 ENCOUNTER — Emergency Department (HOSPITAL_COMMUNITY): Payer: Medicare Other

## 2016-12-25 DIAGNOSIS — N186 End stage renal disease: Secondary | ICD-10-CM | POA: Insufficient documentation

## 2016-12-25 DIAGNOSIS — I120 Hypertensive chronic kidney disease with stage 5 chronic kidney disease or end stage renal disease: Secondary | ICD-10-CM | POA: Insufficient documentation

## 2016-12-25 DIAGNOSIS — R7881 Bacteremia: Secondary | ICD-10-CM | POA: Diagnosis not present

## 2016-12-25 DIAGNOSIS — Z87891 Personal history of nicotine dependence: Secondary | ICD-10-CM | POA: Diagnosis not present

## 2016-12-25 DIAGNOSIS — D509 Iron deficiency anemia, unspecified: Secondary | ICD-10-CM | POA: Diagnosis not present

## 2016-12-25 DIAGNOSIS — N2581 Secondary hyperparathyroidism of renal origin: Secondary | ICD-10-CM | POA: Diagnosis not present

## 2016-12-25 DIAGNOSIS — R6883 Chills (without fever): Secondary | ICD-10-CM | POA: Diagnosis not present

## 2016-12-25 DIAGNOSIS — Z992 Dependence on renal dialysis: Secondary | ICD-10-CM | POA: Diagnosis not present

## 2016-12-25 DIAGNOSIS — R509 Fever, unspecified: Secondary | ICD-10-CM | POA: Diagnosis present

## 2016-12-25 LAB — CBC WITH DIFFERENTIAL/PLATELET
BASOS ABS: 0 10*3/uL (ref 0.0–0.1)
Basophils Relative: 0 %
EOS ABS: 0.4 10*3/uL (ref 0.0–0.7)
Eosinophils Relative: 3 %
HCT: 40.7 % (ref 39.0–52.0)
Hemoglobin: 13.3 g/dL (ref 13.0–17.0)
LYMPHS ABS: 0.9 10*3/uL (ref 0.7–4.0)
Lymphocytes Relative: 6 %
MCH: 21.6 pg — ABNORMAL LOW (ref 26.0–34.0)
MCHC: 32.7 g/dL (ref 30.0–36.0)
MCV: 66.1 fL — ABNORMAL LOW (ref 78.0–100.0)
MONO ABS: 0.6 10*3/uL (ref 0.1–1.0)
Monocytes Relative: 4 %
NEUTROS ABS: 12.7 10*3/uL — AB (ref 1.7–7.7)
Neutrophils Relative %: 87 %
Platelets: 221 10*3/uL (ref 150–400)
RBC: 6.16 MIL/uL — ABNORMAL HIGH (ref 4.22–5.81)
RDW: 19.4 % — AB (ref 11.5–15.5)
WBC: 14.6 10*3/uL — ABNORMAL HIGH (ref 4.0–10.5)

## 2016-12-25 LAB — BASIC METABOLIC PANEL
ANION GAP: 12 (ref 5–15)
BUN: 21 mg/dL — AB (ref 6–20)
CALCIUM: 9 mg/dL (ref 8.9–10.3)
CO2: 25 mmol/L (ref 22–32)
Chloride: 97 mmol/L — ABNORMAL LOW (ref 101–111)
Creatinine, Ser: 8.12 mg/dL — ABNORMAL HIGH (ref 0.61–1.24)
GFR, EST AFRICAN AMERICAN: 7 mL/min — AB (ref >60)
GFR, EST NON AFRICAN AMERICAN: 6 mL/min — AB (ref >60)
GLUCOSE: 97 mg/dL (ref 65–99)
Potassium: 4.5 mmol/L (ref 3.5–5.1)
SODIUM: 134 mmol/L — AB (ref 135–145)

## 2016-12-25 LAB — I-STAT CG4 LACTIC ACID, ED
LACTIC ACID, VENOUS: 2.79 mmol/L — AB (ref 0.5–1.9)
Lactic Acid, Venous: 1.58 mmol/L (ref 0.5–1.9)

## 2016-12-25 MED ORDER — ACETAMINOPHEN 500 MG PO TABS
1000.0000 mg | ORAL_TABLET | Freq: Once | ORAL | Status: AC
  Administered 2016-12-25: 1000 mg via ORAL
  Filled 2016-12-25: qty 2

## 2016-12-25 NOTE — ED Provider Notes (Signed)
[place] DEPT Provider Note   CSN: [id number] Arrival date & time: 12/25/16  1034     History   Chief Complaint Chief Complaint  Patient presents with  . Chills    HPI Joseph HIGHAM Sr. is a 76 y.o. male.  Patient complains of chills, which started this morning, while he was at dialysis.  Because of the chills he was treated with IV antibiotics, after blood cultures were done.  He then came here for evaluation.  He denies recent cough, skin rash, vomiting, diarrhea, weakness or dizziness.  There are no other no modifying factors.  HPI  Past Medical History:  Diagnosis Date  . Anxiety   . Arthritis   . Bifascicular block   . Cardiomyopathy ([place])   . CVA (cerebral infarction)   . Dialysis patient ([place])   . ESRD (end stage renal disease) on dialysis [facility])    M/W/F at [facility] in [place]  . Gastric ulcer 2004   treated for h.pylori see PSH  . Hypertension   . Low iron   . LVH (left ventricular hypertrophy)   . Stroke [facility])    left side weakness    Patient Active Problem List   Diagnosis Date Noted  . Nonischemic cardiomyopathy ([place]) 08/15/2015  . Aortic stenosis 08/15/2015  . Moderate aortic stenosis 07/12/2015  . 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 CAD- 40-50% LAD at cath 07/11/15 07/11/2015  . Aspirin intolerance 07/11/2015  . Abnormal stress test   . ESRD on dialysis ([place]) 05/30/2015  . CTS (carpal tunnel syndrome) 02/28/2015  . PVD of LE - Dr Oneida Alar follows 08/20/2012  . Ulcer of foot ([place]) 01/16/2012  . Encounter for screening colonoscopy 10/08/2011  . Melena 10/08/2011  . Other complications due to renal dialysis device, implant, and graft 09/24/2011    Past Surgical History:  Procedure Laterality Date  . ABDOMINAL AORTAGRAM N/A 01/24/2012   Procedure: ABDOMINAL Maxcine Ham;  Surgeon: Elam Dutch, MD;  Location: [facility] CATH LAB;  Service: Cardiovascular;  Laterality: N/A;  . ARTERIOVENOUS GRAFT PLACEMENT Right right arm  . AV  FISTULA PLACEMENT Left 08/31/2015   Procedure: ARTERIOVENOUS (AV) FISTULA CREATION- LEFT RADIOCEPHALIC;  Surgeon: Mal Misty, MD;  Location: [place];  Service: Vascular;  Laterality: Left;  . BASCILIC VEIN TRANSPOSITION Left 12/19/2015   Procedure: FIRST STAGE BRACHIAL VEIN TRANSPOSITION;  Surgeon: Conrad [place], MD;  Location: [place];  Service: Vascular;  Laterality: Left;  . BASCILIC VEIN TRANSPOSITION Left 02/08/2016   Procedure: SECOND STAGE BRACHIAL VEIN TRANSPOSITION;  Surgeon: Conrad Perry, MD;  Location: [place];  Service: Vascular;  Laterality: Left;  . CARDIAC CATHETERIZATION N/A 07/11/2015   Procedure: Left Heart Cath and Coronary Angiography;  Surgeon: Troy Sine, MD;  Location: [place] CV LAB;  Service: Cardiovascular;  Laterality: N/A;  . Carpel Tunnel Left Dec. 22, 2016  . CHOLECYSTECTOMY    . COLONOSCOPY  2004   Dr. Irving Shows, left sided diverticula and cecal polyp, path unknown  . COLONOSCOPY  10/29/2011   Procedure: COLONOSCOPY;  Surgeon: Daneil Dolin, MD;  [facility];  Service: Endoscopy;  Laterality: N/A;  10:15  . ESOPHAGOGASTRODUODENOSCOPY  11/2002   Dr. Gala Romney, erosive reflux esophagitis, multiple gastric ulcer and antral/bulbar erosions. Serologies positive for H.Pylori and was treated  . ESOPHAGOGASTRODUODENOSCOPY  11/20014   Dr. Gala Romney, small hh only, ulcers healed  . ESOPHAGOGASTRODUODENOSCOPY  09/21/2011   Dr Trevor Iha HH, antral erosions, ?early GAVE  . FISTULOGRAM Left  12/10/2016   Procedure: THROMBECTOMY OF LEFT ARM ARTERIOVENOUS FISTULA;  Surgeon: Waynetta Sandy, MD;  Location: [place];  Service: Vascular;  Laterality: Left;  . INSERTION OF DIALYSIS CATHETER Left 12/10/2016   Procedure: INSERTION OF TUNNELED DIALYSIS CATHETER;  Surgeon: Waynetta Sandy, MD;  Location: [place];  Service: Vascular;  Laterality: Left;  . IR GENERIC HISTORICAL  07/16/2016   IR REMOVAL TUN CV CATH W/O FL 07/16/2016 Saverio Danker, PA-C [facility]-INTER[facility] RAD  .  LIGATION OF ARTERIOVENOUS  FISTULA Left 12/19/2015   Procedure: LIGATION OF RADIOCEPHALIC ARTERIOVENOUS  FISTULA;  Surgeon: Conrad Bettles, MD;  Location: [place];  Service: Vascular;  Laterality: Left;  . PERIPHERAL VASCULAR CATHETERIZATION N/A 12/14/2015   Procedure: Fistulagram;  Surgeon: Conrad [place]up, MD;  Location: [place] CV LAB;  Service: Cardiovascular;  Laterality: N/A;  . SHOULDER SURGERY    . UPPER EXTREMITY VENOGRAPHY Bilateral 12/17/2016   Procedure: Bilateral Upper Extremity Venography;  Surgeon: Serafina Mitchell, MD;  Location: [place] CV LAB;  Service: Cardiovascular;  Laterality: Bilateral;       Home Medications    Prior to Admission medications   Medication Sig Start Date End Date Taking? Authorizing Provider  ALPRAZolam Duanne Moron) 0.5 MG tablet Take 0.5 mg by mouth daily. 10/06/15  Yes [provider]  atorvastatin (LIPITOR) 40 MG tablet Take 40 mg by mouth daily. 11/03/15  Yes [provider]  b complex-vitamin c-folic acid (NEPHRO-VITE) 0.8 MG TABS Take 0.8 mg by mouth See admin instructions. Takes on Tuesdays, Thursdays, Saturdays, and Sundays. Does not take on Mondays, Wednesdays, and Fridays due to Dialysis treatments.   Yes [provider]  cinacalcet (SENSIPAR) 60 MG tablet Take 60 mg by mouth every evening. With evening meal   Yes [provider]  loratadine (CLARITIN) 10 MG tablet Take 1 tablet (10 mg total) by mouth every other day as needed for allergies. 11/08/15  Yes [name], MD  meclizine (ANTIVERT) 25 MG tablet Take 25 mg by mouth daily as needed for dizziness (Non dialysis days).    Yes [provider]  oxyCODONE-acetaminophen (PERCOCET/ROXICET) 5-325 MG tablet Take 1 tablet by mouth every 6 (six) hours as needed. 12/10/16  Yes Alvia Grove, PA-C  promethazine (PHENERGAN) 25 MG tablet Take 25 mg by mouth every 8 (eight) hours as needed for nausea or vomiting.  08/03/10  Yes [provider]  sevelamer  (RENVELA) 800 MG tablet Take 800-2,400 mg by mouth See admin instructions. Take 3200  mg by mouth 3 times daily with meals and take 800 mg by mouth with snacks.   Yes [provider]    Family History Family History  Problem Relation Age of Onset  . Hypertension Mother   . Colon cancer Neg Hx   . Liver disease Neg Hx     Social History Social History  Substance Use Topics  . Smoking status: Former Smoker    Quit date: 04/29/2004  . Smokeless tobacco: Former Systems developer    Types: [place] date: 01/16/1987     Comment: quit 2006  . Alcohol use No     Allergies   Aspirin   Review of Systems Review of Systems  All other systems reviewed and are negative.    Physical Exam Updated Vital Signs BP (!) 148/105   Pulse (!) 127   Temp (!) 104.8 F (40.4 C) Comment: edp aware and no orders received.   Resp (!) 28   Ht  5\' 5"  (1.651 m)   Wt 77.1 kg (170 lb)   SpO2 90%   BMI 28.29 kg/m   Physical Exam  Constitutional: He is oriented to person, place, and time. He appears well-developed. No distress.  Elderly, frail  HENT:  Head: Normocephalic and atraumatic.  Right Ear: External ear normal.  Left Ear: External ear normal.  Eyes: Pupils are equal, round, and reactive to light. Conjunctivae and EOM are normal.  Neck: Normal range of motion and phonation normal. Neck supple.  Cardiovascular: Normal rate, regular rhythm and normal heart sounds.   Dialysis catheter left upper chest wall, site and appliance appear normal.  Pulmonary/Chest: Effort normal and breath sounds normal. He exhibits no bony tenderness.  Abdominal: Soft. There is no tenderness.  Musculoskeletal: Normal range of motion. He exhibits no edema or deformity.  Neurological: He is alert and oriented to person, place, and time. No cranial nerve deficit or sensory deficit. He exhibits normal muscle tone. Coordination normal.  No dysarthria or aphasia.  Skin: Skin is warm, dry and intact.  Psychiatric: He  has a normal mood and affect. His behavior is normal. Judgment and thought content normal.  Nursing note and vitals reviewed.    ED Treatments / Results  Labs (all labs ordered are listed, but only abnormal results are displayed) Labs Reviewed  BASIC METABOLIC PANEL - Abnormal; Notable for the following:       Result Value   Sodium 134 (*)    Chloride 97 (*)    BUN 21 (*)    Creatinine, Ser 8.12 (*)    GFR calc non Af Amer 6 (*)    GFR calc Af Amer 7 (*)    All other components within normal limits  CBC WITH DIFFERENTIAL/PLATELET - Abnormal; Notable for the following:    WBC 14.6 (*)    RBC 6.16 (*)    MCV 66.1 (*)    MCH 21.6 (*)    RDW 19.4 (*)    Neutro Abs 12.7 (*)    All other components within normal limits  I-STAT CG4 LACTIC ACID, ED - Abnormal; Notable for the following:    Lactic Acid, Venous 2.79 (*)    All other components within normal limits    EKG  EKG Interpretation  Date/Time:  Wednesday December 25 2016 11:16:00 EDT Ventricular Rate:  113 PR Interval:    QRS Duration: 156 QT Interval:  357 QTC Calculation: 490 R Axis:   146 Text Interpretation:  Sinus tachycardia Multiform ventricular premature complexes RBBB and LPFB Since last tracing rate faster Confirmed by Daleen Bo [phone]) on 12/25/2016 12:53:26 PM       Radiology Dg Chest 2 View  Result Date: 12/25/2016 CLINICAL DATA:  Chills. EXAM: CHEST  2 VIEW COMPARISON:  12/10/2016 FINDINGS: Borderline heart size that is stable. Dialysis catheter on the left with tip at the SVC level. Stable tortuosity of the atherosclerotic aorta. Chronic vascular prominence. No pulmonary edema or pleural effusion. No evidence of pneumonia. No acute osseous finding.  Postoperative right humerus. IMPRESSION: No evidence of acute disease.  Stable from prior. Electronically Signed   By: Monte Fantasia M.D.   On: 12/25/2016 12:23    Procedures Procedures (including critical care time)  Medications Ordered in  ED Medications  acetaminophen (TYLENOL) tablet 1,000 mg (1,000 mg Oral Given 12/25/16 1224)     Initial Impression / Assessment and Plan / ED Course  I have reviewed the triage vital signs and the nursing notes.  Pertinent  labs & imaging results that were available during my care of the patient were reviewed by me and considered in my medical decision making (see chart for details).      Patient Vitals for the past 24 hrs:  BP Temp Temp src Pulse Resp SpO2 Height Weight  12/25/16 1400 (!) 148/105 - - - (!) 28 - - -  12/25/16 1350 - (!) 104.8 F (40.4 C) - - - - - -  12/25/16 1330 (!) 175/144 - - (!) 127 (!) 32 90 % - -  12/25/16 1300 (!) 160/138 - - (!) 124 (!) 36 - - -  12/25/16 1221 - (!) 103.5 F (39.7 C) Rectal - - - - -  12/25/16 1200 (!) 171/79 - - 60 - 97 % - -  12/25/16 1145 - - - (!) 111 (!) 23 95 % - -  12/25/16 1130 (!) 151/97 - - (!) 112 (!) 27 95 % - -  12/25/16 1107 - (!) 97.5 F (36.4 C) Oral (!) 122 18 94 % 5\' 5"  (1.651 m) 77.1 kg (170 lb)   14: 00-case discussed with his nephrologist, Dr. Hinda Lenis.  He states that the patient received Ancef and Fortaz, during the last hour of dialysis, today.  He plans on continuing antibiotics, for the duration of blood culture surveillance.  He agrees with discharge from the ED at this time  2:51 PM Reevaluation with update and discussion. After initial assessment and treatment, an updated evaluation reveals he is comfortable, feels well, and is tolerated oral liquids here.  Findings discussed with patient, and family member, all questions answered. Teesha Ohm L     Final Clinical Impressions(s) / ED Diagnoses   Final diagnoses:  Bacteremia    End-stage hemodialysis with chills, fever and suspected bacteremia, likely due to Central dialysis catheter.  Patient has already been treated with appropriate antibiotics and is not due another dose until his next dialysis treatment in 2 days.  He is hemodynamically stable.  He is  mentating normally.  His fever, is being controlled with Tylenol.  There is no indication for hospitalization, at this time.  Nursing Notes Reviewed/ Care Coordinated Applicable Imaging Reviewed Interpretation of Laboratory Data incorporated into ED treatment  The patient appears reasonably screened and/or stabilized for discharge and I doubt any other medical condition or other [facility] requiring further screening, evaluation, or treatment in the ED at this time prior to discharge.  Plan: Home Medications-continue usual treatment, and use APAP for every 4 hours; Home Treatments-usual diet; return here if the recommended treatment, does not improve the symptoms; Recommended follow up-PCP and nephrology as needed, and s planned   New Prescriptions New Prescriptions   No medications on file     Daleen Bo, MD 12/25/16 1456

## 2016-12-25 NOTE — Discharge Instructions (Signed)
You have been treated for suspected bacteremia, at dialysis.  It is important to make sure you are eating and drinking normally.  Also for fever take Tylenol every 4 hours around-the-clock.  An appropriate dose for you is 1000 mg of Tylenol every 6 hours.  Return here, if needed, for problems.

## 2016-12-25 NOTE — ED Notes (Signed)
Pt difficult stick. RN in to attempt US guided IV, edp states hold IV.

## 2016-12-25 NOTE — ED Notes (Signed)
edp aware of lactic acid. No orders received at this time.

## 2016-12-25 NOTE — ED Triage Notes (Addendum)
Pt referred by [facility]. Facility reports pt received three hours of treatment today when sudden onset chills and shortness of breath. Mild dyspnea noted at rest. Pt denies pain,cough, fever. Facility reported obtained blood cultures x2 and reported administered 2 grams ancef PTA. Pt has dialysis catheter noted to left chest.

## 2016-12-27 DIAGNOSIS — Z992 Dependence on renal dialysis: Secondary | ICD-10-CM | POA: Diagnosis not present

## 2016-12-27 DIAGNOSIS — N2581 Secondary hyperparathyroidism of renal origin: Secondary | ICD-10-CM | POA: Diagnosis not present

## 2016-12-27 DIAGNOSIS — T82858A Stenosis of vascular prosthetic devices, implants and grafts, initial encounter: Secondary | ICD-10-CM | POA: Diagnosis not present

## 2016-12-27 DIAGNOSIS — D509 Iron deficiency anemia, unspecified: Secondary | ICD-10-CM | POA: Diagnosis not present

## 2016-12-27 DIAGNOSIS — R6883 Chills (without fever): Secondary | ICD-10-CM | POA: Diagnosis not present

## 2016-12-27 DIAGNOSIS — N186 End stage renal disease: Secondary | ICD-10-CM | POA: Diagnosis not present

## 2016-12-28 DIAGNOSIS — T80212A Local infection due to central venous catheter, initial encounter: Secondary | ICD-10-CM | POA: Diagnosis not present

## 2016-12-28 DIAGNOSIS — D509 Iron deficiency anemia, unspecified: Secondary | ICD-10-CM | POA: Diagnosis not present

## 2016-12-28 DIAGNOSIS — N2581 Secondary hyperparathyroidism of renal origin: Secondary | ICD-10-CM | POA: Diagnosis not present

## 2016-12-28 DIAGNOSIS — Z992 Dependence on renal dialysis: Secondary | ICD-10-CM | POA: Diagnosis not present

## 2016-12-28 DIAGNOSIS — N186 End stage renal disease: Secondary | ICD-10-CM | POA: Diagnosis not present

## 2016-12-28 DIAGNOSIS — T82898A Other specified complication of vascular prosthetic devices, implants and grafts, initial encounter: Secondary | ICD-10-CM | POA: Diagnosis not present

## 2016-12-28 DIAGNOSIS — B9562 Methicillin resistant Staphylococcus aureus infection as the cause of diseases classified elsewhere: Secondary | ICD-10-CM | POA: Diagnosis not present

## 2016-12-30 DIAGNOSIS — D509 Iron deficiency anemia, unspecified: Secondary | ICD-10-CM | POA: Diagnosis not present

## 2016-12-30 DIAGNOSIS — B9562 Methicillin resistant Staphylococcus aureus infection as the cause of diseases classified elsewhere: Secondary | ICD-10-CM | POA: Diagnosis not present

## 2016-12-30 DIAGNOSIS — T80212A Local infection due to central venous catheter, initial encounter: Secondary | ICD-10-CM | POA: Diagnosis not present

## 2016-12-30 DIAGNOSIS — Z992 Dependence on renal dialysis: Secondary | ICD-10-CM | POA: Diagnosis not present

## 2016-12-30 DIAGNOSIS — N2581 Secondary hyperparathyroidism of renal origin: Secondary | ICD-10-CM | POA: Diagnosis not present

## 2016-12-30 DIAGNOSIS — N186 End stage renal disease: Secondary | ICD-10-CM | POA: Diagnosis not present

## 2017-01-01 DIAGNOSIS — B9562 Methicillin resistant Staphylococcus aureus infection as the cause of diseases classified elsewhere: Secondary | ICD-10-CM | POA: Diagnosis not present

## 2017-01-01 DIAGNOSIS — T80212A Local infection due to central venous catheter, initial encounter: Secondary | ICD-10-CM | POA: Diagnosis not present

## 2017-01-01 DIAGNOSIS — D509 Iron deficiency anemia, unspecified: Secondary | ICD-10-CM | POA: Diagnosis not present

## 2017-01-01 DIAGNOSIS — N2581 Secondary hyperparathyroidism of renal origin: Secondary | ICD-10-CM | POA: Diagnosis not present

## 2017-01-01 DIAGNOSIS — N186 End stage renal disease: Secondary | ICD-10-CM | POA: Diagnosis not present

## 2017-01-01 DIAGNOSIS — Z992 Dependence on renal dialysis: Secondary | ICD-10-CM | POA: Diagnosis not present

## 2017-01-03 DIAGNOSIS — T80212A Local infection due to central venous catheter, initial encounter: Secondary | ICD-10-CM | POA: Diagnosis not present

## 2017-01-03 DIAGNOSIS — N186 End stage renal disease: Secondary | ICD-10-CM | POA: Diagnosis not present

## 2017-01-03 DIAGNOSIS — N2581 Secondary hyperparathyroidism of renal origin: Secondary | ICD-10-CM | POA: Diagnosis not present

## 2017-01-03 DIAGNOSIS — D509 Iron deficiency anemia, unspecified: Secondary | ICD-10-CM | POA: Diagnosis not present

## 2017-01-03 DIAGNOSIS — B9562 Methicillin resistant Staphylococcus aureus infection as the cause of diseases classified elsewhere: Secondary | ICD-10-CM | POA: Diagnosis not present

## 2017-01-03 DIAGNOSIS — Z992 Dependence on renal dialysis: Secondary | ICD-10-CM | POA: Diagnosis not present

## 2017-01-04 DIAGNOSIS — D509 Iron deficiency anemia, unspecified: Secondary | ICD-10-CM | POA: Diagnosis not present

## 2017-01-04 DIAGNOSIS — Z992 Dependence on renal dialysis: Secondary | ICD-10-CM | POA: Diagnosis not present

## 2017-01-04 DIAGNOSIS — N2581 Secondary hyperparathyroidism of renal origin: Secondary | ICD-10-CM | POA: Diagnosis not present

## 2017-01-04 DIAGNOSIS — T80212A Local infection due to central venous catheter, initial encounter: Secondary | ICD-10-CM | POA: Diagnosis not present

## 2017-01-04 DIAGNOSIS — B9562 Methicillin resistant Staphylococcus aureus infection as the cause of diseases classified elsewhere: Secondary | ICD-10-CM | POA: Diagnosis not present

## 2017-01-04 DIAGNOSIS — N186 End stage renal disease: Secondary | ICD-10-CM | POA: Diagnosis not present

## 2017-01-06 DIAGNOSIS — Z992 Dependence on renal dialysis: Secondary | ICD-10-CM | POA: Diagnosis not present

## 2017-01-06 DIAGNOSIS — B9562 Methicillin resistant Staphylococcus aureus infection as the cause of diseases classified elsewhere: Secondary | ICD-10-CM | POA: Diagnosis not present

## 2017-01-06 DIAGNOSIS — T80212A Local infection due to central venous catheter, initial encounter: Secondary | ICD-10-CM | POA: Diagnosis not present

## 2017-01-06 DIAGNOSIS — N186 End stage renal disease: Secondary | ICD-10-CM | POA: Diagnosis not present

## 2017-01-06 DIAGNOSIS — N2581 Secondary hyperparathyroidism of renal origin: Secondary | ICD-10-CM | POA: Diagnosis not present

## 2017-01-06 DIAGNOSIS — D509 Iron deficiency anemia, unspecified: Secondary | ICD-10-CM | POA: Diagnosis not present

## 2017-01-08 DIAGNOSIS — T80212A Local infection due to central venous catheter, initial encounter: Secondary | ICD-10-CM | POA: Diagnosis not present

## 2017-01-08 DIAGNOSIS — Z992 Dependence on renal dialysis: Secondary | ICD-10-CM | POA: Diagnosis not present

## 2017-01-08 DIAGNOSIS — B9562 Methicillin resistant Staphylococcus aureus infection as the cause of diseases classified elsewhere: Secondary | ICD-10-CM | POA: Diagnosis not present

## 2017-01-08 DIAGNOSIS — N2581 Secondary hyperparathyroidism of renal origin: Secondary | ICD-10-CM | POA: Diagnosis not present

## 2017-01-08 DIAGNOSIS — D509 Iron deficiency anemia, unspecified: Secondary | ICD-10-CM | POA: Diagnosis not present

## 2017-01-08 DIAGNOSIS — N186 End stage renal disease: Secondary | ICD-10-CM | POA: Diagnosis not present

## 2017-01-10 DIAGNOSIS — D509 Iron deficiency anemia, unspecified: Secondary | ICD-10-CM | POA: Diagnosis not present

## 2017-01-10 DIAGNOSIS — T80212A Local infection due to central venous catheter, initial encounter: Secondary | ICD-10-CM | POA: Diagnosis not present

## 2017-01-10 DIAGNOSIS — N2581 Secondary hyperparathyroidism of renal origin: Secondary | ICD-10-CM | POA: Diagnosis not present

## 2017-01-10 DIAGNOSIS — B9562 Methicillin resistant Staphylococcus aureus infection as the cause of diseases classified elsewhere: Secondary | ICD-10-CM | POA: Diagnosis not present

## 2017-01-10 DIAGNOSIS — N186 End stage renal disease: Secondary | ICD-10-CM | POA: Diagnosis not present

## 2017-01-10 DIAGNOSIS — Z992 Dependence on renal dialysis: Secondary | ICD-10-CM | POA: Diagnosis not present

## 2017-01-13 DIAGNOSIS — Z992 Dependence on renal dialysis: Secondary | ICD-10-CM | POA: Diagnosis not present

## 2017-01-13 DIAGNOSIS — N186 End stage renal disease: Secondary | ICD-10-CM | POA: Diagnosis not present

## 2017-01-13 DIAGNOSIS — T80212A Local infection due to central venous catheter, initial encounter: Secondary | ICD-10-CM | POA: Diagnosis not present

## 2017-01-13 DIAGNOSIS — D509 Iron deficiency anemia, unspecified: Secondary | ICD-10-CM | POA: Diagnosis not present

## 2017-01-13 DIAGNOSIS — B9562 Methicillin resistant Staphylococcus aureus infection as the cause of diseases classified elsewhere: Secondary | ICD-10-CM | POA: Diagnosis not present

## 2017-01-13 DIAGNOSIS — N2581 Secondary hyperparathyroidism of renal origin: Secondary | ICD-10-CM | POA: Diagnosis not present

## 2017-01-15 ENCOUNTER — Ambulatory Visit: Payer: Medicare Other | Admitting: Vascular Surgery

## 2017-01-15 ENCOUNTER — Encounter (HOSPITAL_COMMUNITY): Payer: Medicare Other

## 2017-01-15 DIAGNOSIS — T80212A Local infection due to central venous catheter, initial encounter: Secondary | ICD-10-CM | POA: Diagnosis not present

## 2017-01-15 DIAGNOSIS — D509 Iron deficiency anemia, unspecified: Secondary | ICD-10-CM | POA: Diagnosis not present

## 2017-01-15 DIAGNOSIS — N186 End stage renal disease: Secondary | ICD-10-CM | POA: Diagnosis not present

## 2017-01-15 DIAGNOSIS — Z992 Dependence on renal dialysis: Secondary | ICD-10-CM | POA: Diagnosis not present

## 2017-01-15 DIAGNOSIS — B9562 Methicillin resistant Staphylococcus aureus infection as the cause of diseases classified elsewhere: Secondary | ICD-10-CM | POA: Diagnosis not present

## 2017-01-15 DIAGNOSIS — N2581 Secondary hyperparathyroidism of renal origin: Secondary | ICD-10-CM | POA: Diagnosis not present

## 2017-01-17 DIAGNOSIS — D509 Iron deficiency anemia, unspecified: Secondary | ICD-10-CM | POA: Diagnosis not present

## 2017-01-17 DIAGNOSIS — T80212A Local infection due to central venous catheter, initial encounter: Secondary | ICD-10-CM | POA: Diagnosis not present

## 2017-01-17 DIAGNOSIS — Z992 Dependence on renal dialysis: Secondary | ICD-10-CM | POA: Diagnosis not present

## 2017-01-17 DIAGNOSIS — N186 End stage renal disease: Secondary | ICD-10-CM | POA: Diagnosis not present

## 2017-01-17 DIAGNOSIS — N2581 Secondary hyperparathyroidism of renal origin: Secondary | ICD-10-CM | POA: Diagnosis not present

## 2017-01-17 DIAGNOSIS — B9562 Methicillin resistant Staphylococcus aureus infection as the cause of diseases classified elsewhere: Secondary | ICD-10-CM | POA: Diagnosis not present

## 2017-01-20 DIAGNOSIS — Z992 Dependence on renal dialysis: Secondary | ICD-10-CM | POA: Diagnosis not present

## 2017-01-20 DIAGNOSIS — T80212A Local infection due to central venous catheter, initial encounter: Secondary | ICD-10-CM | POA: Diagnosis not present

## 2017-01-20 DIAGNOSIS — B9562 Methicillin resistant Staphylococcus aureus infection as the cause of diseases classified elsewhere: Secondary | ICD-10-CM | POA: Diagnosis not present

## 2017-01-20 DIAGNOSIS — N186 End stage renal disease: Secondary | ICD-10-CM | POA: Diagnosis not present

## 2017-01-20 DIAGNOSIS — D509 Iron deficiency anemia, unspecified: Secondary | ICD-10-CM | POA: Diagnosis not present

## 2017-01-20 DIAGNOSIS — N2581 Secondary hyperparathyroidism of renal origin: Secondary | ICD-10-CM | POA: Diagnosis not present

## 2017-01-22 DIAGNOSIS — D509 Iron deficiency anemia, unspecified: Secondary | ICD-10-CM | POA: Diagnosis not present

## 2017-01-22 DIAGNOSIS — T80212A Local infection due to central venous catheter, initial encounter: Secondary | ICD-10-CM | POA: Diagnosis not present

## 2017-01-22 DIAGNOSIS — N186 End stage renal disease: Secondary | ICD-10-CM | POA: Diagnosis not present

## 2017-01-22 DIAGNOSIS — Z992 Dependence on renal dialysis: Secondary | ICD-10-CM | POA: Diagnosis not present

## 2017-01-22 DIAGNOSIS — N2581 Secondary hyperparathyroidism of renal origin: Secondary | ICD-10-CM | POA: Diagnosis not present

## 2017-01-22 DIAGNOSIS — B9562 Methicillin resistant Staphylococcus aureus infection as the cause of diseases classified elsewhere: Secondary | ICD-10-CM | POA: Diagnosis not present

## 2017-01-24 DIAGNOSIS — T80212A Local infection due to central venous catheter, initial encounter: Secondary | ICD-10-CM | POA: Diagnosis not present

## 2017-01-24 DIAGNOSIS — L97521 Non-pressure chronic ulcer of other part of left foot limited to breakdown of skin: Secondary | ICD-10-CM | POA: Diagnosis not present

## 2017-01-24 DIAGNOSIS — N2581 Secondary hyperparathyroidism of renal origin: Secondary | ICD-10-CM | POA: Diagnosis not present

## 2017-01-24 DIAGNOSIS — Z992 Dependence on renal dialysis: Secondary | ICD-10-CM | POA: Diagnosis not present

## 2017-01-24 DIAGNOSIS — B9562 Methicillin resistant Staphylococcus aureus infection as the cause of diseases classified elsewhere: Secondary | ICD-10-CM | POA: Diagnosis not present

## 2017-01-24 DIAGNOSIS — N186 End stage renal disease: Secondary | ICD-10-CM | POA: Diagnosis not present

## 2017-01-24 DIAGNOSIS — D509 Iron deficiency anemia, unspecified: Secondary | ICD-10-CM | POA: Diagnosis not present

## 2017-01-26 DIAGNOSIS — N186 End stage renal disease: Secondary | ICD-10-CM | POA: Diagnosis not present

## 2017-01-26 DIAGNOSIS — Z992 Dependence on renal dialysis: Secondary | ICD-10-CM | POA: Diagnosis not present

## 2017-01-27 DIAGNOSIS — N186 End stage renal disease: Secondary | ICD-10-CM | POA: Diagnosis not present

## 2017-01-27 DIAGNOSIS — D509 Iron deficiency anemia, unspecified: Secondary | ICD-10-CM | POA: Diagnosis not present

## 2017-01-27 DIAGNOSIS — Z992 Dependence on renal dialysis: Secondary | ICD-10-CM | POA: Diagnosis not present

## 2017-01-27 DIAGNOSIS — N2581 Secondary hyperparathyroidism of renal origin: Secondary | ICD-10-CM | POA: Diagnosis not present

## 2017-01-29 DIAGNOSIS — D509 Iron deficiency anemia, unspecified: Secondary | ICD-10-CM | POA: Diagnosis not present

## 2017-01-29 DIAGNOSIS — N2581 Secondary hyperparathyroidism of renal origin: Secondary | ICD-10-CM | POA: Diagnosis not present

## 2017-01-29 DIAGNOSIS — N186 End stage renal disease: Secondary | ICD-10-CM | POA: Diagnosis not present

## 2017-01-29 DIAGNOSIS — Z992 Dependence on renal dialysis: Secondary | ICD-10-CM | POA: Diagnosis not present

## 2017-01-31 DIAGNOSIS — N186 End stage renal disease: Secondary | ICD-10-CM | POA: Diagnosis not present

## 2017-01-31 DIAGNOSIS — Z992 Dependence on renal dialysis: Secondary | ICD-10-CM | POA: Diagnosis not present

## 2017-01-31 DIAGNOSIS — N2581 Secondary hyperparathyroidism of renal origin: Secondary | ICD-10-CM | POA: Diagnosis not present

## 2017-01-31 DIAGNOSIS — D509 Iron deficiency anemia, unspecified: Secondary | ICD-10-CM | POA: Diagnosis not present

## 2017-02-03 DIAGNOSIS — N2581 Secondary hyperparathyroidism of renal origin: Secondary | ICD-10-CM | POA: Diagnosis not present

## 2017-02-03 DIAGNOSIS — Z79899 Other long term (current) drug therapy: Secondary | ICD-10-CM | POA: Diagnosis not present

## 2017-02-03 DIAGNOSIS — N186 End stage renal disease: Secondary | ICD-10-CM | POA: Diagnosis not present

## 2017-02-03 DIAGNOSIS — Z992 Dependence on renal dialysis: Secondary | ICD-10-CM | POA: Diagnosis not present

## 2017-02-03 DIAGNOSIS — E785 Hyperlipidemia, unspecified: Secondary | ICD-10-CM | POA: Diagnosis not present

## 2017-02-03 DIAGNOSIS — D509 Iron deficiency anemia, unspecified: Secondary | ICD-10-CM | POA: Diagnosis not present

## 2017-02-05 DIAGNOSIS — D509 Iron deficiency anemia, unspecified: Secondary | ICD-10-CM | POA: Diagnosis not present

## 2017-02-05 DIAGNOSIS — N2581 Secondary hyperparathyroidism of renal origin: Secondary | ICD-10-CM | POA: Diagnosis not present

## 2017-02-05 DIAGNOSIS — Z992 Dependence on renal dialysis: Secondary | ICD-10-CM | POA: Diagnosis not present

## 2017-02-05 DIAGNOSIS — N186 End stage renal disease: Secondary | ICD-10-CM | POA: Diagnosis not present

## 2017-02-06 NOTE — Progress Notes (Signed)
Established Dialysis Access   History of Present Illness   [name]. is a 76 y.o. (20-Apr-1941) male who presents for re-evaluation for permanent access.  The patient is right hand dominant.  Previous access procedures have been completed in the both arms.  The patient's complication from previous access procedures include: thrombosis.  The patient has never had a previous PPM placed.  Past Medical History:  Diagnosis Date  . Anxiety   . Arthritis   . Bifascicular block   . Cardiomyopathy ([place])   . CVA (cerebral infarction)   . Dialysis patient ([place])   . ESRD (end stage renal disease) on dialysis [facility])    M/W/F at [facility] in [place]  . Gastric ulcer 2004   treated for h.pylori see PSH  . Hypertension   . Low iron   . LVH (left ventricular hypertrophy)   . Stroke [facility])    left side weakness    Past Surgical History:  Procedure Laterality Date  . ABDOMINAL AORTAGRAM N/A 01/24/2012   Procedure: ABDOMINAL Maxcine Ham;  Surgeon: Elam Dutch, MD;  Location: [facility] CATH LAB;  Service: Cardiovascular;  Laterality: N/A;  . ARTERIOVENOUS GRAFT PLACEMENT Right right arm  . AV FISTULA PLACEMENT Left 08/31/2015   Procedure: ARTERIOVENOUS (AV) FISTULA CREATION- LEFT RADIOCEPHALIC;  Surgeon: Mal Misty, MD;  Location: [place];  Service: Vascular;  Laterality: Left;  . BASCILIC VEIN TRANSPOSITION Left 12/19/2015   Procedure: FIRST STAGE BRACHIAL VEIN TRANSPOSITION;  Surgeon: Conrad [place], MD;  Location: [place];  Service: Vascular;  Laterality: Left;  . BASCILIC VEIN TRANSPOSITION Left 02/08/2016   Procedure: SECOND STAGE BRACHIAL VEIN TRANSPOSITION;  Surgeon: Conrad [place], MD;  Location: [place];  Service: Vascular;  Laterality: Left;  . CARDIAC CATHETERIZATION N/A 07/11/2015   Procedure: Left Heart Cath and Coronary Angiography;  Surgeon: Troy Sine, MD;  Location: [place] CV LAB;  Service: Cardiovascular;  Laterality: N/A;  . Carpel Tunnel Left Dec. 22, 2016  . CHOLECYSTECTOMY     . COLONOSCOPY  2004   Dr. Irving Shows, left sided diverticula and cecal polyp, path unknown  . COLONOSCOPY  10/29/2011   Procedure: COLONOSCOPY;  Surgeon: Daneil Dolin, MD;  [facility];  Service: Endoscopy;  Laterality: N/A;  10:15  . ESOPHAGOGASTRODUODENOSCOPY  11/2002   Dr. Gala Romney, erosive reflux esophagitis, multiple gastric ulcer and antral/bulbar erosions. Serologies positive for H.Pylori and was treated  . ESOPHAGOGASTRODUODENOSCOPY  11/20014   Dr. Gala Romney, small hh only, ulcers healed  . ESOPHAGOGASTRODUODENOSCOPY  09/21/2011   Dr Trevor Iha HH, antral erosions, ?early GAVE  . FISTULOGRAM Left 12/10/2016   Procedure: THROMBECTOMY OF LEFT ARM ARTERIOVENOUS FISTULA;  Surgeon: Waynetta Sandy, MD;  Location: [place];  Service: Vascular;  Laterality: Left;  . INSERTION OF DIALYSIS CATHETER Left 12/10/2016   Procedure: INSERTION OF TUNNELED DIALYSIS CATHETER;  Surgeon: Waynetta Sandy, MD;  Location: [place];  Service: Vascular;  Laterality: Left;  . IR GENERIC HISTORICAL  07/16/2016   IR REMOVAL TUN CV CATH W/O FL 07/16/2016 Saverio Danker, PA-C [facility]INTER[facility]  . LIGATION OF ARTERIOVENOUS  FISTULA Left 12/19/2015   Procedure: LIGATION OF RADIOCEPHALIC ARTERIOVENOUS  FISTULA;  Surgeon: Conrad [place], MD;  Location: [place];  Service: Vascular;  Laterality: Left;  . PERIPHERAL VASCULAR CATHETERIZATION N/A 12/14/2015   Procedure: Fistulagram;  Surgeon: Conrad [place], MD;  Location: [place] CV LAB;  Service: Cardiovascular;  Laterality: N/A;  . SHOULDER SURGERY    .  UPPER EXTREMITY VENOGRAPHY Bilateral 12/17/2016   Procedure: Bilateral Upper Extremity Venography;  Surgeon: Serafina Mitchell, MD;  Location: [place] CV LAB;  Service: Cardiovascular;  Laterality: Bilateral;    Social History   Social History  . Marital status: Single    Spouse name: N/A  . Number of children: 2  . Years of education: N/A   Occupational History  . retired, [employer] Retired    Social History Main Topics  . Smoking status: Former Smoker    Quit date: 04/29/2004  . Smokeless tobacco: Former Systems developer    Types: [place] date: 01/16/1987     Comment: quit 2006  . Alcohol use No  . Drug use: No  . Sexual activity: Not on file   Other Topics Concern  . Not on file   Social History Narrative   Lives alone   Daughter 20-min away   Caffeine use: 32oz soda per day    Family History  Problem Relation Age of Onset  . Hypertension Mother   . Colon cancer Neg Hx   . Liver disease Neg Hx     Current Outpatient Prescriptions  Medication Sig Dispense Refill  . ALPRAZolam (XANAX) 0.5 MG tablet Take 0.5 mg by mouth daily.  3  . atorvastatin (LIPITOR) 40 MG tablet Take 40 mg by mouth daily.  3  . b complex-vitamin c-folic acid (NEPHRO-VITE) 0.8 MG TABS Take 0.8 mg by mouth See admin instructions. Takes on Tuesdays, Thursdays, Saturdays, and Sundays. Does not take on Mondays, Wednesdays, and Fridays due to Dialysis treatments.    . cinacalcet (SENSIPAR) 60 MG tablet Take 60 mg by mouth every evening. With evening meal    . loratadine (CLARITIN) 10 MG tablet Take 1 tablet (10 mg total) by mouth every other day as needed for allergies. 30 tablet 0  . meclizine (ANTIVERT) 25 MG tablet Take 25 mg by mouth daily as needed for dizziness (Non dialysis days).     Mar[place] Kitchen oxyCODONE-acetaminophen (PERCOCET/ROXICET) 5-325 MG tablet Take 1 tablet by mouth every 6 (six) hours as needed. 6 tablet 0  . promethazine (PHENERGAN) 25 MG tablet Take 25 mg by mouth every 8 (eight) hours as needed for nausea or vomiting.     . sevelamer (RENVELA) 800 MG tablet Take 800-2,400 mg by mouth See admin instructions. Take 3200  mg by mouth 3 times daily with meals and take 800 mg by mouth with snacks.     No current facility-administered medications for this visit.      Allergies  Allergen Reactions  . Aspirin Other (See Comments)    INTERNAL BLEEDING    REVIEW OF SYSTEMS (negative unless  checked):   Cardiac:  []  Chest pain or chest pressure? []  Shortness of breath upon activity? []  Shortness of breath when lying flat? []  Irregular heart rhythm?  Vascular:  []  Pain in calf, thigh, or hip brought on by walking? []  Pain in feet at night that wakes you up from your sleep? []  Blood clot in your veins? []  Leg swelling?  Pulmonary:  []  Oxygen at home? [x]  Chronic cough? []  Wheezing?  Neurologic:  []  Sudden weakness in arms or legs? []  Sudden numbness in arms or legs? []  Sudden onset of difficult speaking or slurred speech? []  Temporary loss of vision in one eye? []  Problems with dizziness?  Gastrointestinal:  []  Blood in stool? []  Vomited blood?  Genitourinary:  []  Burning when urinating? []  Blood in urine? [x]  end stage renal  disease-HD: M/W/F  Psychiatric:  []  Major depression  Hematologic:  []  Bleeding problems? []  Problems with blood clotting?  Dermatologic:  []  Rashes or ulcers?  Constitutional:  []  Fever or chills?  Ear/Nose/Throat:  []  Change in hearing? []  Nose bleeds? []  Sore throat?  Musculoskeletal:  []  Back pain? []  Joint pain? []  Muscle pain?    Physical Examination   Vitals:   02/07/17 1348 02/07/17 1351  BP: (!) 162/92 (!) 151/89  Pulse: 86 86  Resp: 18   Temp: 97.8 F (36.6 C)   SpO2: 99%   Weight: 178 lb (80.7 kg)   Height: 5\' 5"  (1.651 m)    Body mass index is 29.62 kg/m.  General Alert, O x 3, WD, NAD, coughing  Pulmonary Sym exp, good B air movt, CTA B  Cardiac RRR, Nl S1, S2, no Murmurs, No rubs, No S3,S4  Vascular Vessel Right Left  Radial Palpable Palpable  Brachial Palpable Palpable  Ulnar Not palpable Not palpable    Musculo- skeletal M/S 5/5 throughout  , Extremities without ischemic changes, palpable prior graft in R arm    Neurologic Pain and light touch intact in extremities , Motor exam as listed above     Non-invasive Vascular Imaging   RUE Vein Mapping  (02/07/2017):   R arm:  acceptable vein conduits include none    Medical Decision Making   VUE PAVON Sr. is a 76 y.o. male who presents with ESRD requiring hemodialysis.    BUE Venogram demonstrates extensive proximal brachial veins and axillary vein.  Based on vein mapping and examination, this patient's permanent access options include: RUA AVG, possible hybrid AVG placement  I had an extensive discussion with this patient in regards to the nature of access surgery, including risk, benefits, and alternatives.    The patient is aware that the risks of access surgery include but are not limited to: bleeding, infection, steal syndrome, nerve damage, ischemic monomelic neuropathy, failure of access to mature, and possible need for additional access procedures in the future.  The patient has agreed to proceed with the above procedure which will be scheduled 23 OCT 18.   Adele Barthel, MD, FACS [facility] [place] Office: [phone] Pager: [phone]

## 2017-02-07 ENCOUNTER — Ambulatory Visit (HOSPITAL_COMMUNITY)
Admission: RE | Admit: 2017-02-07 | Discharge: 2017-02-07 | Disposition: A | Discharge status: Home / Self Care | Payer: Medicare Other | Source: Ambulatory Visit | Attending: Vascular Surgery | Admitting: Vascular Surgery

## 2017-02-07 ENCOUNTER — Ambulatory Visit (INDEPENDENT_AMBULATORY_CARE_PROVIDER_SITE_OTHER): Payer: Self-pay | Admitting: Vascular Surgery

## 2017-02-07 ENCOUNTER — Encounter: Payer: Self-pay | Admitting: *Deleted

## 2017-02-07 ENCOUNTER — Encounter: Payer: Self-pay | Admitting: Vascular Surgery

## 2017-02-07 VITALS — BP 151/89 | HR 86 | Temp 97.8°F | Resp 18 | Ht 65.0 in | Wt 178.0 lb

## 2017-02-07 DIAGNOSIS — N186 End stage renal disease: Secondary | ICD-10-CM | POA: Insufficient documentation

## 2017-02-07 DIAGNOSIS — Z992 Dependence on renal dialysis: Secondary | ICD-10-CM

## 2017-02-07 DIAGNOSIS — Z01818 Encounter for other preprocedural examination: Secondary | ICD-10-CM | POA: Insufficient documentation

## 2017-02-07 DIAGNOSIS — Z48812 Encounter for surgical aftercare following surgery on the circulatory system: Secondary | ICD-10-CM | POA: Insufficient documentation

## 2017-02-08 DIAGNOSIS — D509 Iron deficiency anemia, unspecified: Secondary | ICD-10-CM | POA: Diagnosis not present

## 2017-02-08 DIAGNOSIS — N186 End stage renal disease: Secondary | ICD-10-CM | POA: Diagnosis not present

## 2017-02-08 DIAGNOSIS — Z992 Dependence on renal dialysis: Secondary | ICD-10-CM | POA: Diagnosis not present

## 2017-02-08 DIAGNOSIS — N2581 Secondary hyperparathyroidism of renal origin: Secondary | ICD-10-CM | POA: Diagnosis not present

## 2017-02-10 ENCOUNTER — Other Ambulatory Visit: Payer: Self-pay | Admitting: *Deleted

## 2017-02-10 DIAGNOSIS — Z992 Dependence on renal dialysis: Secondary | ICD-10-CM | POA: Diagnosis not present

## 2017-02-10 DIAGNOSIS — D509 Iron deficiency anemia, unspecified: Secondary | ICD-10-CM | POA: Diagnosis not present

## 2017-02-10 DIAGNOSIS — N186 End stage renal disease: Secondary | ICD-10-CM | POA: Diagnosis not present

## 2017-02-10 DIAGNOSIS — N2581 Secondary hyperparathyroidism of renal origin: Secondary | ICD-10-CM | POA: Diagnosis not present

## 2017-02-12 DIAGNOSIS — D509 Iron deficiency anemia, unspecified: Secondary | ICD-10-CM | POA: Diagnosis not present

## 2017-02-12 DIAGNOSIS — N2581 Secondary hyperparathyroidism of renal origin: Secondary | ICD-10-CM | POA: Diagnosis not present

## 2017-02-12 DIAGNOSIS — Z992 Dependence on renal dialysis: Secondary | ICD-10-CM | POA: Diagnosis not present

## 2017-02-12 DIAGNOSIS — N186 End stage renal disease: Secondary | ICD-10-CM | POA: Diagnosis not present

## 2017-02-14 DIAGNOSIS — D509 Iron deficiency anemia, unspecified: Secondary | ICD-10-CM | POA: Diagnosis not present

## 2017-02-14 DIAGNOSIS — N2581 Secondary hyperparathyroidism of renal origin: Secondary | ICD-10-CM | POA: Diagnosis not present

## 2017-02-14 DIAGNOSIS — L97521 Non-pressure chronic ulcer of other part of left foot limited to breakdown of skin: Secondary | ICD-10-CM | POA: Diagnosis not present

## 2017-02-14 DIAGNOSIS — N186 End stage renal disease: Secondary | ICD-10-CM | POA: Diagnosis not present

## 2017-02-14 DIAGNOSIS — Z992 Dependence on renal dialysis: Secondary | ICD-10-CM | POA: Diagnosis not present

## 2017-02-17 ENCOUNTER — Encounter (HOSPITAL_COMMUNITY): Payer: Self-pay | Admitting: *Deleted

## 2017-02-17 DIAGNOSIS — N2581 Secondary hyperparathyroidism of renal origin: Secondary | ICD-10-CM | POA: Diagnosis not present

## 2017-02-17 DIAGNOSIS — Z992 Dependence on renal dialysis: Secondary | ICD-10-CM | POA: Diagnosis not present

## 2017-02-17 DIAGNOSIS — N186 End stage renal disease: Secondary | ICD-10-CM | POA: Diagnosis not present

## 2017-02-17 DIAGNOSIS — D509 Iron deficiency anemia, unspecified: Secondary | ICD-10-CM | POA: Diagnosis not present

## 2017-02-17 NOTE — Progress Notes (Signed)
Pt denies SOB, chest pain, and being under the care of a cardiologist. Pt made aware to stop taking vitamins, fish oil and herbal medications. Do not take any NSAIDs ie: Ibuprofen, Advil, Naproxen (Aleve), Motrin, BC and Goody Powder. Pt verbalized understanding of all pre-op instructions.

## 2017-02-18 ENCOUNTER — Encounter (HOSPITAL_COMMUNITY): Payer: Self-pay | Admitting: *Deleted

## 2017-02-18 ENCOUNTER — Ambulatory Visit (HOSPITAL_COMMUNITY): Payer: Medicare Other | Admitting: Anesthesiology

## 2017-02-18 ENCOUNTER — Telehealth: Payer: Self-pay | Admitting: Vascular Surgery

## 2017-02-18 ENCOUNTER — Encounter (HOSPITAL_COMMUNITY)
Admission: RE | Disposition: A | Discharge status: Home / Self Care | Payer: Self-pay | Source: Ambulatory Visit | Attending: Vascular Surgery

## 2017-02-18 ENCOUNTER — Ambulatory Visit (HOSPITAL_COMMUNITY)
Admission: RE | Admit: 2017-02-18 | Discharge: 2017-02-18 | Disposition: A | Discharge status: Home / Self Care | Payer: Medicare Other | Source: Ambulatory Visit | Attending: Vascular Surgery | Admitting: Vascular Surgery

## 2017-02-18 DIAGNOSIS — F419 Anxiety disorder, unspecified: Secondary | ICD-10-CM | POA: Insufficient documentation

## 2017-02-18 DIAGNOSIS — I120 Hypertensive chronic kidney disease with stage 5 chronic kidney disease or end stage renal disease: Secondary | ICD-10-CM | POA: Insufficient documentation

## 2017-02-18 DIAGNOSIS — Z8719 Personal history of other diseases of the digestive system: Secondary | ICD-10-CM | POA: Insufficient documentation

## 2017-02-18 DIAGNOSIS — Z87891 Personal history of nicotine dependence: Secondary | ICD-10-CM | POA: Diagnosis not present

## 2017-02-18 DIAGNOSIS — K210 Gastro-esophageal reflux disease with esophagitis: Secondary | ICD-10-CM | POA: Insufficient documentation

## 2017-02-18 DIAGNOSIS — I509 Heart failure, unspecified: Secondary | ICD-10-CM | POA: Diagnosis not present

## 2017-02-18 DIAGNOSIS — Z79899 Other long term (current) drug therapy: Secondary | ICD-10-CM | POA: Insufficient documentation

## 2017-02-18 DIAGNOSIS — L720 Epidermal cyst: Secondary | ICD-10-CM | POA: Insufficient documentation

## 2017-02-18 DIAGNOSIS — Z8673 Personal history of transient ischemic attack (TIA), and cerebral infarction without residual deficits: Secondary | ICD-10-CM | POA: Diagnosis not present

## 2017-02-18 DIAGNOSIS — N185 Chronic kidney disease, stage 5: Secondary | ICD-10-CM | POA: Diagnosis not present

## 2017-02-18 DIAGNOSIS — I429 Cardiomyopathy, unspecified: Secondary | ICD-10-CM | POA: Insufficient documentation

## 2017-02-18 DIAGNOSIS — T82868A Thrombosis of vascular prosthetic devices, implants and grafts, initial encounter: Secondary | ICD-10-CM | POA: Diagnosis not present

## 2017-02-18 DIAGNOSIS — N186 End stage renal disease: Secondary | ICD-10-CM | POA: Insufficient documentation

## 2017-02-18 DIAGNOSIS — Z992 Dependence on renal dialysis: Secondary | ICD-10-CM | POA: Insufficient documentation

## 2017-02-18 DIAGNOSIS — I130 Hypertensive heart and chronic kidney disease with heart failure and stage 1 through stage 4 chronic kidney disease, or unspecified chronic kidney disease: Secondary | ICD-10-CM | POA: Diagnosis not present

## 2017-02-18 HISTORY — PX: MASS EXCISION: SHX2000

## 2017-02-18 HISTORY — PX: AV FISTULA PLACEMENT: SHX1204

## 2017-02-18 LAB — POCT I-STAT 4, (NA,K, GLUC, HGB,HCT)
Glucose, Bld: 90 mg/dL (ref 65–99)
HCT: 48 % (ref 39.0–52.0)
Hemoglobin: 16.3 g/dL (ref 13.0–17.0)
POTASSIUM: 3.9 mmol/L (ref 3.5–5.1)
Sodium: 139 mmol/L (ref 135–145)

## 2017-02-18 SURGERY — INSERTION OF ARTERIOVENOUS (AV) GORE-TEX GRAFT ARM
Anesthesia: Monitor Anesthesia Care | Site: Arm Upper | Laterality: Right

## 2017-02-18 MED ORDER — CEFUROXIME SODIUM 1.5 G IV SOLR
INTRAVENOUS | Status: AC
  Filled 2017-02-18: qty 1.5

## 2017-02-18 MED ORDER — HEPARIN SODIUM (PORCINE) 1000 UNIT/ML IJ SOLN
INTRAMUSCULAR | Status: AC
  Filled 2017-02-18: qty 1

## 2017-02-18 MED ORDER — HEPARIN SODIUM (PORCINE) 1000 UNIT/ML IJ SOLN
INTRAMUSCULAR | Status: DC | PRN
  Administered 2017-02-18: 4000 [IU] via INTRAVENOUS

## 2017-02-18 MED ORDER — ONDANSETRON HCL 4 MG/2ML IJ SOLN
INTRAMUSCULAR | Status: DC | PRN
  Administered 2017-02-18: 4 mg via INTRAVENOUS

## 2017-02-18 MED ORDER — LIDOCAINE HCL 1 % IJ SOLN
INTRAMUSCULAR | Status: AC
  Filled 2017-02-18: qty 20

## 2017-02-18 MED ORDER — FENTANYL CITRATE (PF) 100 MCG/2ML IJ SOLN: 25.0000 ug | INTRAMUSCULAR | Status: DC | PRN

## 2017-02-18 MED ORDER — FENTANYL CITRATE (PF) 250 MCG/5ML IJ SOLN
INTRAMUSCULAR | Status: AC
  Filled 2017-02-18: qty 5

## 2017-02-18 MED ORDER — SODIUM CHLORIDE 0.9 % IV SOLN
INTRAVENOUS | Status: DC
  Administered 2017-02-18: 07:00:00 via INTRAVENOUS

## 2017-02-18 MED ORDER — ONDANSETRON HCL 4 MG/2ML IJ SOLN
INTRAMUSCULAR | Status: AC
  Filled 2017-02-18: qty 2

## 2017-02-18 MED ORDER — PROTAMINE SULFATE 10 MG/ML IV SOLN
INTRAVENOUS | Status: AC
  Filled 2017-02-18: qty 5

## 2017-02-18 MED ORDER — HEMOSTATIC AGENTS (NO CHARGE) OPTIME
TOPICAL | Status: DC | PRN
  Administered 2017-02-18: 1 via TOPICAL

## 2017-02-18 MED ORDER — DEXAMETHASONE SODIUM PHOSPHATE 10 MG/ML IJ SOLN
INTRAMUSCULAR | Status: AC
  Filled 2017-02-18: qty 1

## 2017-02-18 MED ORDER — LIDOCAINE HCL (CARDIAC) 20 MG/ML IV SOLN
INTRAVENOUS | Status: DC | PRN
  Administered 2017-02-18: 20 mg via INTRAVENOUS

## 2017-02-18 MED ORDER — OXYCODONE-ACETAMINOPHEN 5-325 MG PO TABS: 1.0000 | ORAL_TABLET | Freq: Four times a day (QID) | ORAL | 0 refills | Status: DC | PRN

## 2017-02-18 MED ORDER — PROPOFOL 10 MG/ML IV BOLUS
INTRAVENOUS | Status: AC
  Filled 2017-02-18: qty 20

## 2017-02-18 MED ORDER — LIDOCAINE 2% (20 MG/ML) 5 ML SYRINGE
INTRAMUSCULAR | Status: AC
  Filled 2017-02-18: qty 5

## 2017-02-18 MED ORDER — PHENYLEPHRINE 40 MCG/ML (10ML) SYRINGE FOR IV PUSH (FOR BLOOD PRESSURE SUPPORT)
PREFILLED_SYRINGE | INTRAVENOUS | Status: AC
  Filled 2017-02-18: qty 10

## 2017-02-18 MED ORDER — LIDOCAINE HCL (PF) 1 % IJ SOLN
INTRAMUSCULAR | Status: DC | PRN
  Administered 2017-02-18: 20 mL
  Administered 2017-02-18: 20 mg

## 2017-02-18 MED ORDER — PROPOFOL 500 MG/50ML IV EMUL
INTRAVENOUS | Status: DC | PRN
  Administered 2017-02-18: 75 ug/kg/min via INTRAVENOUS

## 2017-02-18 MED ORDER — MIDAZOLAM HCL 5 MG/5ML IJ SOLN
INTRAMUSCULAR | Status: DC | PRN
  Administered 2017-02-18: 1 mg via INTRAVENOUS

## 2017-02-18 MED ORDER — 0.9 % SODIUM CHLORIDE (POUR BTL) OPTIME
TOPICAL | Status: DC | PRN
  Administered 2017-02-18: 1000 mL

## 2017-02-18 MED ORDER — MEPERIDINE HCL 25 MG/ML IJ SOLN: 6.2500 mg | INTRAMUSCULAR | Status: DC | PRN

## 2017-02-18 MED ORDER — MIDAZOLAM HCL 2 MG/2ML IJ SOLN
INTRAMUSCULAR | Status: AC
  Filled 2017-02-18: qty 2

## 2017-02-18 MED ORDER — DEXTROSE 5 % IV SOLN
1.5000 g | INTRAVENOUS | Status: AC
  Administered 2017-02-18: 1.5 g via INTRAVENOUS

## 2017-02-18 MED ORDER — SODIUM CHLORIDE 0.9 % IV SOLN
INTRAVENOUS | Status: DC | PRN
  Administered 2017-02-18: 08:00:00

## 2017-02-18 MED ORDER — ONDANSETRON HCL 4 MG/2ML IJ SOLN: 4.0000 mg | Freq: Once | INTRAMUSCULAR | Status: DC | PRN

## 2017-02-18 MED ORDER — PROTAMINE SULFATE 10 MG/ML IV SOLN
INTRAVENOUS | Status: DC | PRN
  Administered 2017-02-18 (×2): 10 mg via INTRAVENOUS

## 2017-02-18 MED ORDER — PHENYLEPHRINE HCL 10 MG/ML IJ SOLN
INTRAMUSCULAR | Status: DC | PRN
  Administered 2017-02-18 (×7): 80 ug via INTRAVENOUS

## 2017-02-18 MED ORDER — FENTANYL CITRATE (PF) 100 MCG/2ML IJ SOLN
INTRAMUSCULAR | Status: DC | PRN
  Administered 2017-02-18 (×2): 25 ug via INTRAVENOUS
  Administered 2017-02-18: 50 ug via INTRAVENOUS

## 2017-02-18 SURGICAL SUPPLY — 49 items
ADH SKN CLS APL DERMABOND .7 (GAUZE/BANDAGES/DRESSINGS) ×1
AGENT HMST SPONGE THK3/8 (HEMOSTASIS) ×1
ARMBAND PINK RESTRICT EXTREMIT (MISCELLANEOUS) ×6 IMPLANT
CANISTER SUCT 3000ML PPV (MISCELLANEOUS) ×3 IMPLANT
CLIP VESOCCLUDE MED 6/CT (CLIP) ×3 IMPLANT
CLIP VESOCCLUDE SM WIDE 6/CT (CLIP) ×3 IMPLANT
COVER PROBE W GEL 5X96 (DRAPES) ×3 IMPLANT
DECANTER SPIKE VIAL GLASS SM (MISCELLANEOUS) ×3 IMPLANT
DERMABOND ADVANCED (GAUZE/BANDAGES/DRESSINGS) ×2
DERMABOND ADVANCED .7 DNX12 (GAUZE/BANDAGES/DRESSINGS) ×1 IMPLANT
ELECT REM PT RETURN 9FT ADLT (ELECTROSURGICAL) ×3
ELECTRODE REM PT RTRN 9FT ADLT (ELECTROSURGICAL) ×1 IMPLANT
GLOVE BIO SURGEON STRL SZ 6.5 (GLOVE) ×1 IMPLANT
GLOVE BIO SURGEON STRL SZ7 (GLOVE) ×3 IMPLANT
GLOVE BIO SURGEONS STRL SZ 6.5 (GLOVE) ×1
GLOVE BIOGEL PI IND STRL 7.0 (GLOVE) IMPLANT
GLOVE BIOGEL PI IND STRL 7.5 (GLOVE) ×1 IMPLANT
GLOVE BIOGEL PI IND STRL 8.5 (GLOVE) IMPLANT
GLOVE BIOGEL PI INDICATOR 7.0 (GLOVE) ×4
GLOVE BIOGEL PI INDICATOR 7.5 (GLOVE) ×2
GLOVE BIOGEL PI INDICATOR 8.5 (GLOVE) ×2
GLOVE ECLIPSE 6.5 STRL STRAW (GLOVE) ×6 IMPLANT
GLOVE SS BIOGEL STRL SZ 8 (GLOVE) IMPLANT
GLOVE SUPERSENSE BIOGEL SZ 8 (GLOVE) ×2
GOWN STRL REUS W/ TWL LRG LVL3 (GOWN DISPOSABLE) ×3 IMPLANT
GOWN STRL REUS W/ TWL XL LVL3 (GOWN DISPOSABLE) IMPLANT
GOWN STRL REUS W/TWL LRG LVL3 (GOWN DISPOSABLE) ×9
GOWN STRL REUS W/TWL XL LVL3 (GOWN DISPOSABLE) ×12
GRAFT GORETEX STRT 4-7X45 (Vascular Products) ×2 IMPLANT
HEMOSTAT SPONGE AVITENE ULTRA (HEMOSTASIS) ×2 IMPLANT
KIT BASIN OR (CUSTOM PROCEDURE TRAY) ×3 IMPLANT
KIT ROOM TURNOVER OR (KITS) ×3 IMPLANT
NS IRRIG 1000ML POUR BTL (IV SOLUTION) ×3 IMPLANT
PACK CV ACCESS (CUSTOM PROCEDURE TRAY) ×3 IMPLANT
PAD ARMBOARD 7.5X6 YLW CONV (MISCELLANEOUS) ×6 IMPLANT
SUT GORETEX 6.0 TT13 (SUTURE) IMPLANT
SUT MNCRL AB 4-0 PS2 18 (SUTURE) ×5 IMPLANT
SUT PROLENE 5 0 C 1 24 (SUTURE) ×2 IMPLANT
SUT PROLENE 6 0 BV (SUTURE) ×5 IMPLANT
SUT PROLENE 7 0 BV 1 (SUTURE) ×4 IMPLANT
SUT SILK 2 0 PERMA HAND 18 BK (SUTURE) IMPLANT
SUT VIC AB 3-0 SH 27 (SUTURE) ×12
SUT VIC AB 3-0 SH 27X BRD (SUTURE) ×2 IMPLANT
SUT VIC AB 4-0 PS2 27 (SUTURE) ×2 IMPLANT
SWAB COLLECTION DEVICE MRSA (MISCELLANEOUS) ×2 IMPLANT
SWAB CULTURE LIQUID MINI MALE (MISCELLANEOUS) ×2 IMPLANT
SYR TOOMEY 50ML (SYRINGE) IMPLANT
UNDERPAD 30X30 (UNDERPADS AND DIAPERS) ×3 IMPLANT
WATER STERILE IRR 1000ML POUR (IV SOLUTION) ×3 IMPLANT

## 2017-02-18 NOTE — Anesthesia Postprocedure Evaluation (Signed)
Anesthesia Post Note  Patient: Joseph LAMPING Sr.  Procedure(s) Performed: INSERTION OF ARTERIOVENOUS (AV) GORE-TEX GRAFT  RIGHT UPPER ARM (Right Arm Upper) EXCISION OF RIGHT AXILLARY EPIDERMAL INCLUSION CYST (Right Arm Upper)     Patient location during evaluation: PACU Anesthesia Type: MAC Level of consciousness: awake Pain management: pain level controlled Vital Signs Assessment: post-procedure vital signs reviewed and stable Respiratory status: spontaneous breathing Cardiovascular status: stable Postop Assessment: no apparent nausea or vomiting Anesthetic complications: no    Last Vitals:  Vitals:   02/18/17 1012 02/18/17 1013  BP:  119/81  Pulse:  80  Resp:  (!) 21  Temp: (!) 36.4 C   SpO2:  100%    Last Pain:  Vitals:   02/18/17 0942  TempSrc:   PainSc: 0-No pain   Pain Goal:                 Ceriah Kohler JR,JOHN Mccartney [place]

## 2017-02-18 NOTE — Interval H&P Note (Signed)
History and Physical Interval Note:  02/18/2017 7:11 AM  Joseph Maryland Sr.  has presented today for surgery, with the diagnosis of end stage renal disease  The various methods of treatment have been discussed with the patient and family. After consideration of risks, benefits and other options for treatment, the patient has consented to  Procedure(s): INSERTION OF ARTERIOVENOUS (AV) GORE-TEX GRAFT ARM (Right) as a surgical intervention .  The patient's history has been reviewed, patient examined, no change in status, stable for surgery.  I have reviewed the patient's chart and labs.  Questions were answered to the patient's satisfaction.     Adele Barthel

## 2017-02-18 NOTE — H&P (View-Only) (Signed)
Established Dialysis Access   History of Present Illness   [name]. is a 76 y.o. (1940-08-09) male who presents for re-evaluation for permanent access.  The patient is right hand dominant.  Previous access procedures have been completed in the both arms.  The patient's complication from previous access procedures include: thrombosis.  The patient has never had a previous PPM placed.  Past Medical History:  Diagnosis Date  . Anxiety   . Arthritis   . Bifascicular block   . Cardiomyopathy ([place])   . CVA (cerebral infarction)   . Dialysis patient ([place])   . ESRD (end stage renal disease) on dialysis [facility])    M/W/F at [facility] in [place]  . Gastric ulcer 2004   treated for h.pylori see PSH  . Hypertension   . Low iron   . LVH (left ventricular hypertrophy)   . Stroke [facility])    left side weakness    Past Surgical History:  Procedure Laterality Date  . ABDOMINAL AORTAGRAM N/A 01/24/2012   Procedure: ABDOMINAL Maxcine Ham;  Surgeon: Elam Dutch, MD;  Location: [facility] CATH LAB;  Service: Cardiovascular;  Laterality: N/A;  . ARTERIOVENOUS GRAFT PLACEMENT Right right arm  . AV FISTULA PLACEMENT Left 08/31/2015   Procedure: ARTERIOVENOUS (AV) FISTULA CREATION- LEFT RADIOCEPHALIC;  Surgeon: Mal Misty, MD;  Location: [place];  Service: Vascular;  Laterality: Left;  . BASCILIC VEIN TRANSPOSITION Left 12/19/2015   Procedure: FIRST STAGE BRACHIAL VEIN TRANSPOSITION;  Surgeon: Conrad Collins, MD;  Location: [place];  Service: Vascular;  Laterality: Left;  . BASCILIC VEIN TRANSPOSITION Left 02/08/2016   Procedure: SECOND STAGE BRACHIAL VEIN TRANSPOSITION;  Surgeon: Conrad Banks, MD;  Location: [place];  Service: Vascular;  Laterality: Left;  . CARDIAC CATHETERIZATION N/A 07/11/2015   Procedure: Left Heart Cath and Coronary Angiography;  Surgeon: Troy Sine, MD;  Location: [place] CV LAB;  Service: Cardiovascular;  Laterality: N/A;  . Carpel Tunnel Left Dec. 22, 2016  . CHOLECYSTECTOMY     . COLONOSCOPY  2004   Dr. Irving Shows, left sided diverticula and cecal polyp, path unknown  . COLONOSCOPY  10/29/2011   Procedure: COLONOSCOPY;  Surgeon: Daneil Dolin, MD;  [facility];  Service: Endoscopy;  Laterality: N/A;  10:15  . ESOPHAGOGASTRODUODENOSCOPY  11/2002   Dr. Gala Romney, erosive reflux esophagitis, multiple gastric ulcer and antral/bulbar erosions. Serologies positive for H.Pylori and was treated  . ESOPHAGOGASTRODUODENOSCOPY  11/20014   Dr. Gala Romney, small hh only, ulcers healed  . ESOPHAGOGASTRODUODENOSCOPY  09/21/2011   Dr Trevor Iha [facility], antral erosions, ?early GAVE  . FISTULOGRAM Left 12/10/2016   Procedure: THROMBECTOMY OF LEFT ARM ARTERIOVENOUS FISTULA;  Surgeon: Waynetta Sandy, MD;  Location: [place];  Service: Vascular;  Laterality: Left;  . INSERTION OF DIALYSIS CATHETER Left 12/10/2016   Procedure: INSERTION OF TUNNELED DIALYSIS CATHETER;  Surgeon: Waynetta Sandy, MD;  Location: [place];  Service: Vascular;  Laterality: Left;  . IR GENERIC HISTORICAL  07/16/2016   IR REMOVAL TUN CV CATH W/O FL 07/16/2016 Saverio Danker, PA-C [facility]-INTERV RAD  . LIGATION OF ARTERIOVENOUS  FISTULA Left 12/19/2015   Procedure: LIGATION OF RADIOCEPHALIC ARTERIOVENOUS  FISTULA;  Surgeon: Conrad Buckner, MD;  Location: [place];  Service: Vascular;  Laterality: Left;  . PERIPHERAL VASCULAR CATHETERIZATION N/A 12/14/2015   Procedure: Fistulagram;  Surgeon: Conrad Atwood, MD;  Location: [place] CV LAB;  Service: Cardiovascular;  Laterality: N/A;  . SHOULDER SURGERY    .  UPPER EXTREMITY VENOGRAPHY Bilateral 12/17/2016   Procedure: Bilateral Upper Extremity Venography;  Surgeon: Serafina Mitchell, MD;  Location: [place] CV LAB;  Service: Cardiovascular;  Laterality: Bilateral;    Social History   Social History  . Marital status: Single    Spouse name: N/A  . Number of children: 2  . Years of education: N/A   Occupational History  . retired, [employer] Retired    Social History Main Topics  . Smoking status: Former Smoker    Quit date: 04/29/2004  . Smokeless tobacco: Former Systems developer    Types: [place] date: 01/16/1987     Comment: quit 2006  . Alcohol use No  . Drug use: No  . Sexual activity: Not on file   Other Topics Concern  . Not on file   Social History Narrative   Lives alone   Daughter 20-min away   Caffeine use: 32oz soda per day    Family History  Problem Relation Age of Onset  . Hypertension Mother   . Colon cancer Neg Hx   . Liver disease Neg Hx     Current Outpatient Prescriptions  Medication Sig Dispense Refill  . ALPRAZolam (XANAX) 0.5 MG tablet Take 0.5 mg by mouth daily.  3  . atorvastatin (LIPITOR) 40 MG tablet Take 40 mg by mouth daily.  3  . b complex-vitamin c-folic acid (NEPHRO-VITE) 0.8 MG TABS Take 0.8 mg by mouth See admin instructions. Takes on Tuesdays, Thursdays, Saturdays, and Sundays. Does not take on Mondays, Wednesdays, and Fridays due to Dialysis treatments.    . cinacalcet (SENSIPAR) 60 MG tablet Take 60 mg by mouth every evening. With evening meal    . loratadine (CLARITIN) 10 MG tablet Take 1 tablet (10 mg total) by mouth every other day as needed for allergies. 30 tablet 0  . meclizine (ANTIVERT) 25 MG tablet Take 25 mg by mouth daily as needed for dizziness (Non dialysis days).     Marland Kitchen oxyCODONE-acetaminophen (PERCOCET/ROXICET) 5-325 MG tablet Take 1 tablet by mouth every 6 (six) hours as needed. 6 tablet 0  . promethazine (PHENERGAN) 25 MG tablet Take 25 mg by mouth every 8 (eight) hours as needed for nausea or vomiting.     . sevelamer (RENVELA) 800 MG tablet Take 800-2,400 mg by mouth See admin instructions. Take 3200  mg by mouth 3 times daily with meals and take 800 mg by mouth with snacks.     No current facility-administered medications for this visit.      Allergies  Allergen Reactions  . Aspirin Other (See Comments)    INTERNAL BLEEDING    REVIEW OF SYSTEMS (negative unless  checked):   Cardiac:  []  Chest pain or chest pressure? []  Shortness of breath upon activity? []  Shortness of breath when lying flat? []  Irregular heart rhythm?  Vascular:  []  Pain in calf, thigh, or hip brought on by walking? []  Pain in feet at night that wakes you up from your sleep? []  Blood clot in your veins? []  Leg swelling?  Pulmonary:  []  Oxygen at home? [x]  Chronic cough? []  Wheezing?  Neurologic:  []  Sudden weakness in arms or legs? []  Sudden numbness in arms or legs? []  Sudden onset of difficult speaking or slurred speech? []  Temporary loss of vision in one eye? []  Problems with dizziness?  Gastrointestinal:  []  Blood in stool? []  Vomited blood?  Genitourinary:  []  Burning when urinating? []  Blood in urine? [x]  end stage renal  disease-HD: M/W/F  Psychiatric:  []  Major depression  Hematologic:  []  Bleeding problems? []  Problems with blood clotting?  Dermatologic:  []  Rashes or ulcers?  Constitutional:  []  Fever or chills?  Ear/Nose/Throat:  []  Change in hearing? []  Nose bleeds? []  Sore throat?  Musculoskeletal:  []  Back pain? []  Joint pain? []  Muscle pain?    Physical Examination   Vitals:   02/07/17 1348 02/07/17 1351  BP: (!) 162/92 (!) 151/89  Pulse: 86 86  Resp: 18   Temp: 97.8 F (36.6 C)   SpO2: 99%   Weight: 178 lb (80.7 kg)   Height: 5\' 5"  (1.651 m)    Body mass index is 29.62 kg/m.  General Alert, O x 3, WD, NAD, coughing  Pulmonary Sym exp, good B air movt, CTA B  Cardiac RRR, Nl S1, S2, no Murmurs, No rubs, No S3,S4  Vascular Vessel Right Left  Radial Palpable Palpable  Brachial Palpable Palpable  Ulnar Not palpable Not palpable    Musculo- skeletal M/S 5/5 throughout  , Extremities without ischemic changes, palpable prior graft in R arm    Neurologic Pain and light touch intact in extremities , Motor exam as listed above     Non-invasive Vascular Imaging   RUE Vein Mapping  (02/07/2017):   R arm:  acceptable vein conduits include none    Medical Decision Making   IANMICHAEL AMESCUA Sr. is a 76 y.o. male who presents with ESRD requiring hemodialysis.    BUE Venogram demonstrates extensive proximal brachial veins and axillary vein.  Based on vein mapping and examination, this patient's permanent access options include: RUA AVG, possible hybrid AVG placement  I had an extensive discussion with this patient in regards to the nature of access surgery, including risk, benefits, and alternatives.    The patient is aware that the risks of access surgery include but are not limited to: bleeding, infection, steal syndrome, nerve damage, ischemic monomelic neuropathy, failure of access to mature, and possible need for additional access procedures in the future.  The patient has agreed to proceed with the above procedure which will be scheduled 23 OCT 18.   Adele Barthel, MD, FACS [facility] [place] Office: [phone] Pager: [phone]

## 2017-02-18 NOTE — Telephone Encounter (Signed)
Sched appt 04/11/17 at 10:45. Lm on cell#.

## 2017-02-18 NOTE — Transfer of Care (Signed)
Immediate Anesthesia Transfer of Care Note  Patient: Joseph Maryland Sr.  Procedure(s) Performed: INSERTION OF ARTERIOVENOUS (AV) GORE-TEX GRAFT  RIGHT UPPER ARM (Right Arm Upper) EXCISION OF RIGHT AXILLARY EPIDERMAL INCLUSION CYST (Right Arm Upper)  Patient Location: PACU  Anesthesia Type:MAC  Level of Consciousness: awake and alert   Airway & Oxygen Therapy: Patient Spontanous Breathing and Patient connected to nasal cannula oxygen  Post-op Assessment: Report given to RN and Post -op Vital signs reviewed and stable  Post vital signs: Reviewed and stable  Last Vitals:  Vitals:   02/18/17 0605  BP: (!) 146/71  Pulse: 87  Resp: 18  Temp: (!) 36.3 C  SpO2: 97%    Last Pain:  Vitals:   02/18/17 0605  TempSrc: Oral         Complications: No apparent anesthesia complications

## 2017-02-18 NOTE — Telephone Encounter (Signed)
-----   Message from Mena Goes, RN sent at 02/18/2017 10:13 AM EDT ----- Regarding: 6-8 weeks   ----- Message ----- From: Conrad Wallace, MD Sent: 02/18/2017   9:50 AM To: [address]  Joseph SEDORE Sr. [id number] 07/07/1940  PROCEDURE:  1.  Right upper arm arteriovenous graft 2.  Excision of right axillary epidermal inclusion cyst  Asst: Linus Orn, CSA  Follow-up: 6-8 weeks

## 2017-02-18 NOTE — Anesthesia Preprocedure Evaluation (Signed)
Anesthesia Evaluation  Patient identified by MRN, date of birth, ID band Patient awake    Reviewed: Allergy & Precautions, NPO status , Patient's Chart, lab work & pertinent test results  Airway Mallampati: II  TM Distance: >3 FB Neck ROM: Full    Dental  (+) Edentulous Upper, Edentulous Lower   Pulmonary former smoker,    breath sounds clear to auscultation       Cardiovascular hypertension, Pt. on medications + CAD and +CHF  Normal cardiovascular exam+ Valvular Problems/Murmurs AS  Rhythm:Regular Rate:Normal  05/2015 Echo: Left ventricle: The cavity size was mildly dilated. Wall  thickness was increased in a pattern of moderate LVH. There was mild focal basal hypertrophy of the septum. Systolic function was  normal. The estimated ejection fraction was in the range of 60  to 65%. Wall motion was normal; there were no regional wall   motion abnormalities. Doppler parameters are consistent with  abnormal left ventricular relaxation (grade 1 diastolic  dysfunction). - Aortic valve: There was moderate stenosis. There was trivial  regurgitation. Valve area (VTI): 0.89 cm^2. Valve area (Vmax):  1.02 cm^2. Valve area (Vmean): 0.91 cm^2. - Mitral valve: Calcified annulus. Mildly thickened leaflets .   There was mild regurgitation. - Left atrium: The atrium was moderately dilated.  Cath 06/2015 - Prox LAD lesion, 45% stenosed. There is moderate to severe left ventricular systolic dysfunction. EF 35%   Neuro/Psych Anxiety LLE weakness from CVA CVA, Residual Symptoms    GI/Hepatic Neg liver ROS, PUD,   Endo/Other  negative endocrine ROS  Renal/GU ESRF and DialysisRenal disease     Musculoskeletal  (+) Arthritis ,   Abdominal Normal abdominal exam  (+)   Peds  Hematology negative hematology ROS (+)   Anesthesia Other Findings Last dialysis Friday, graft clotted yesterday  Reproductive/Obstetrics                              Lab Results  Component Value Date   WBC 14.6 (H) 12/25/2016   HGB 13.3 12/25/2016   HCT 40.7 12/25/2016   MCV 66.1 (L) 12/25/2016   PLT 221 12/25/2016   Lab Results  Component Value Date   CREATININE 8.12 (H) 12/25/2016   BUN 21 (H) 12/25/2016   NA 134 (L) 12/25/2016   K 4.5 12/25/2016   CL 97 (L) 12/25/2016   CO2 25 12/25/2016    Anesthesia Physical  Anesthesia Plan  ASA: III  Anesthesia Plan: MAC   Post-op Pain Management:    Induction: Intravenous  PONV Risk Score and Plan: 1 and Treatment may vary due to age or medical condition  Airway Management Planned: Nasal Cannula and Natural Airway  Additional Equipment:   Intra-op Plan:   Post-operative Plan:   Informed Consent: I have reviewed the patients History and Physical, chart, labs and discussed the procedure including the risks, benefits and alternatives for the proposed anesthesia with the patient or authorized representative who has indicated his/her understanding and acceptance.   Dental advisory given  Plan Discussed with: CRNA and Surgeon  Anesthesia Plan Comments:         Anesthesia Quick Evaluation

## 2017-02-18 NOTE — Progress Notes (Signed)
He will have his son-in-law Jacques Navy  [phone]) pick him up, but Mr. Glo Herring won't be off work until 3:00pm.  Mr Armwood may have a back up driver.

## 2017-02-18 NOTE — Op Note (Signed)
OPERATIVE NOTE   PROCEDURE:  1.  Right upper arm arteriovenous graft 2.  Excision of right axillary epidermal inclusion cyst  PRE-OPERATIVE DIAGNOSIS: end stage renal disease   POST-OPERATIVE DIAGNOSIS: same as above   SURGEON: Adele Barthel, MD  ASSISTANT(S): Linus Orn, CSA  ANESTHESIA: local and MAC  ESTIMATED BLOOD LOSS: 50 cc  FINDING(S): 1. Brachial artery: 4-4.5 mm, limited atherosclerotic disease 2. Brachial vein: 6-7 mm 3. Palpable thrill at end of case 4. Dopplerable right radial signal 5. Epidermal inclusion cyst without communication to axillary exposure  SPECIMEN(S):  none  INDICATIONS:   Joseph EYERMAN Sr. is a 76 y.o. male who presents with end stage renal disease requiring hemodialysis.  The patient present for right upper arm arteriovenous graft.  Risk, benefits, and alternatives to access surgery were discussed.  The patient is aware the risks include but are not limited to: bleeding, infection, steal syndrome, nerve damage, ischemic monomelic neuropathy, failure to mature, and need for additional procedures.  The patient is aware of the risks and elects to proceed forward.   DESCRIPTION: After full informed written consent was obtained from the patient, the patient was brought back to the operating room and placed supine upon the operating table.  The patient was given IV antibiotics prior to proceeding.  After obtaining adequate sedation, the patient was prepped and draped in standard fashion for a right arm access procedure.    I turned my attention first to the antecubitum.  Under ultrasound guidance, I identified the location of the brachial artery and marked it on the skin.  I then examined the bicipital groove and identified the axillary vein and marked it on the skin.    I injected a total of 40 cc of 1% lidocaine without epinephrine overlying the arterial and venous exposures and along the tunnel route of the graft.  I made an incision over the  brachial artery, and dissected down through the subcutaneous tissue to the fascia carefully and was able to dissect out the brachial artery.  The artery was about 4-4.5 mm externally.  It was controlled proximally and distally with vessel loops   I turned my attention to the high bicipital groove.  I made an incision overlying the proximal vein and dissected down through the subcutaneous tissue and fascia until I reached the high brachial vein.  Externally, it appeared to be 6-7 mm in diameter.  I then dissected out this vein proximally and distally.    I took a Dietitian and dissected from the arterial exposure up to the venous exposure.  I then delivered the 4 x 7-mm stretch Gore-Tex graft through the metal tunnel and then pulled out the metal tunnel leaving the graft in place.  The 4-mm end of the graft was left in the arterial exposure and the 7 mm graft was left in the venous exposure.    I then gave the patient 4000 units of heparin to gain some anticoagulation.  After waiting 3 minutes, I placed the brachial artery under tension proximally and distally with vessel loops.  I then made an arteriotomy and extended it with a Potts scissor.  I sewed the 4-mm end of the graft to this arteriotomy with a running stitch of 7-0 Prolene.  I released the vessel loops on the inflow and allowed the artery to decompress through the graft. There was good pulsatile bleeding through this graft.  I clamped the graft near its arterial anastomosis and sucked out all  the blood in the graft and loaded the graft with heparinized saline.    At this point, I pulled the graft to appropriate length and reset my exposure of the high brachial vein.  I tied off the vein distally with a 2-0 silk and then transected it.  There was vigorous venous backbleeding from the vein.  I then injected some heparinized saline into this vein and then clamped it.  I then spatulated the vein to facilitate an end-to-end anastomosis.  I  also spatulated the graft to facilitate an end-to-end anastomosis.  In the process of spatulating, I cut the graft to appropriate length for this anastomosis.  This graft was sewn to the vein in an end-to-end configuration with a 5-0 Prolene.  Prior to completing this anastomosis, I allowed the vein to backbleed.  I also allowed the graft to bleed in an antegrade fashion.  There was no clot observe from either end of the anastomosis.  I completed this anastomosis in the usual fashion.  At this point, I irrigated out the venous exposure and then packed Avitene in this incision.  I turned my attention back to the arterial exposure.  This incision was washed out also.  I repacked the incision with Avitene.  I tested the distal arterial flow with a continuous doppler: distal radial signal was dopplerable.  The brachial artery proximally and distally had multiphasic waveforms.  The venous outflow had a flow signature consistent with a widely patent arteriovenous graft.  There was an easily palpable thrill in the high brachial vein.    At this point, I washed out the arterial exposure.  There was no more active bleeding.  The subcutaneous tissue was reapproximated with a running stitch of 3-0 Vicryl.  The skin was then reapproximated with a running subcuticular 4-0 Monocryl.  The skin was then cleaned, dried, and Dermabond used to reinforce the skin closure.  I then repeated this same process in the vein exposure.  Similarly the bleeding was evaluated and the subcutaneous tissue and skin reapproximated and closed.  At this point, I turned my attention to a palpable mass adjacent to the venous exposure.  I suspected this was an epidermal inclusion cyst.  I elected to remove this as it could potentially contaminate my newly placed arteriovenous graft.  I injected 3 cc of 1% lidocaine without epinephrine over the cyst.  I made an incision with a 15 blade.  I gently evacuated the contents of the cyst.  This cyst  contents appeared to be old sebum, consistent with epidermal inclusion cyst.  In this process of bluntly dissecting out the cyst, I identified the wall of the cyst.  I dissected out the entirety of the cyst wall with electrocautery.  There was no bleeding in this cavity.  I washed out the cavity and then reapproximated the subcutaneous tissue with a 3-0 Vicryl.  The skin was then reapproximated with a running subcuticular of 4-0 Moncryl.  The skin was cleaned, dried, and reinforced with Dermabond.   COMPLICATIONS: none  CONDITION: stable   Adele Barthel, MD, [facility] [place] Office: [phone] Pager: [phone]  02/18/2017, 9:35 AM

## 2017-02-19 ENCOUNTER — Encounter (HOSPITAL_COMMUNITY): Payer: Self-pay | Admitting: Vascular Surgery

## 2017-02-19 DIAGNOSIS — N186 End stage renal disease: Secondary | ICD-10-CM | POA: Diagnosis not present

## 2017-02-19 DIAGNOSIS — Z992 Dependence on renal dialysis: Secondary | ICD-10-CM | POA: Diagnosis not present

## 2017-02-19 DIAGNOSIS — D509 Iron deficiency anemia, unspecified: Secondary | ICD-10-CM | POA: Diagnosis not present

## 2017-02-19 DIAGNOSIS — N2581 Secondary hyperparathyroidism of renal origin: Secondary | ICD-10-CM | POA: Diagnosis not present

## 2017-02-21 DIAGNOSIS — D509 Iron deficiency anemia, unspecified: Secondary | ICD-10-CM | POA: Diagnosis not present

## 2017-02-21 DIAGNOSIS — N2581 Secondary hyperparathyroidism of renal origin: Secondary | ICD-10-CM | POA: Diagnosis not present

## 2017-02-21 DIAGNOSIS — Z992 Dependence on renal dialysis: Secondary | ICD-10-CM | POA: Diagnosis not present

## 2017-02-21 DIAGNOSIS — N186 End stage renal disease: Secondary | ICD-10-CM | POA: Diagnosis not present

## 2017-02-22 LAB — AEROBIC/ANAEROBIC CULTURE (SURGICAL/DEEP WOUND)

## 2017-02-22 LAB — AEROBIC/ANAEROBIC CULTURE W GRAM STAIN (SURGICAL/DEEP WOUND): Culture: NO GROWTH

## 2017-02-24 DIAGNOSIS — Z992 Dependence on renal dialysis: Secondary | ICD-10-CM | POA: Diagnosis not present

## 2017-02-24 DIAGNOSIS — N186 End stage renal disease: Secondary | ICD-10-CM | POA: Diagnosis not present

## 2017-02-24 DIAGNOSIS — N2581 Secondary hyperparathyroidism of renal origin: Secondary | ICD-10-CM | POA: Diagnosis not present

## 2017-02-24 DIAGNOSIS — D509 Iron deficiency anemia, unspecified: Secondary | ICD-10-CM | POA: Diagnosis not present

## 2017-02-26 DIAGNOSIS — N186 End stage renal disease: Secondary | ICD-10-CM | POA: Diagnosis not present

## 2017-02-26 DIAGNOSIS — N2581 Secondary hyperparathyroidism of renal origin: Secondary | ICD-10-CM | POA: Diagnosis not present

## 2017-02-26 DIAGNOSIS — Z992 Dependence on renal dialysis: Secondary | ICD-10-CM | POA: Diagnosis not present

## 2017-02-26 DIAGNOSIS — D509 Iron deficiency anemia, unspecified: Secondary | ICD-10-CM | POA: Diagnosis not present

## 2017-02-27 DIAGNOSIS — I110 Hypertensive heart disease with heart failure: Secondary | ICD-10-CM | POA: Diagnosis not present

## 2017-02-27 DIAGNOSIS — D509 Iron deficiency anemia, unspecified: Secondary | ICD-10-CM | POA: Diagnosis not present

## 2017-02-27 DIAGNOSIS — I5022 Chronic systolic (congestive) heart failure: Secondary | ICD-10-CM | POA: Diagnosis not present

## 2017-02-27 DIAGNOSIS — N186 End stage renal disease: Secondary | ICD-10-CM | POA: Diagnosis not present

## 2017-02-27 DIAGNOSIS — Z992 Dependence on renal dialysis: Secondary | ICD-10-CM | POA: Diagnosis not present

## 2017-02-27 DIAGNOSIS — T82898A Other specified complication of vascular prosthetic devices, implants and grafts, initial encounter: Secondary | ICD-10-CM | POA: Diagnosis not present

## 2017-02-27 DIAGNOSIS — K219 Gastro-esophageal reflux disease without esophagitis: Secondary | ICD-10-CM | POA: Diagnosis not present

## 2017-02-27 DIAGNOSIS — N2581 Secondary hyperparathyroidism of renal origin: Secondary | ICD-10-CM | POA: Diagnosis not present

## 2017-02-28 DIAGNOSIS — N2581 Secondary hyperparathyroidism of renal origin: Secondary | ICD-10-CM | POA: Diagnosis not present

## 2017-02-28 DIAGNOSIS — D509 Iron deficiency anemia, unspecified: Secondary | ICD-10-CM | POA: Diagnosis not present

## 2017-02-28 DIAGNOSIS — T82898A Other specified complication of vascular prosthetic devices, implants and grafts, initial encounter: Secondary | ICD-10-CM | POA: Diagnosis not present

## 2017-02-28 DIAGNOSIS — N186 End stage renal disease: Secondary | ICD-10-CM | POA: Diagnosis not present

## 2017-02-28 DIAGNOSIS — Z992 Dependence on renal dialysis: Secondary | ICD-10-CM | POA: Diagnosis not present

## 2017-03-03 DIAGNOSIS — D509 Iron deficiency anemia, unspecified: Secondary | ICD-10-CM | POA: Diagnosis not present

## 2017-03-03 DIAGNOSIS — T82898A Other specified complication of vascular prosthetic devices, implants and grafts, initial encounter: Secondary | ICD-10-CM | POA: Diagnosis not present

## 2017-03-03 DIAGNOSIS — N2581 Secondary hyperparathyroidism of renal origin: Secondary | ICD-10-CM | POA: Diagnosis not present

## 2017-03-03 DIAGNOSIS — N186 End stage renal disease: Secondary | ICD-10-CM | POA: Diagnosis not present

## 2017-03-03 DIAGNOSIS — Z992 Dependence on renal dialysis: Secondary | ICD-10-CM | POA: Diagnosis not present

## 2017-03-05 DIAGNOSIS — Z992 Dependence on renal dialysis: Secondary | ICD-10-CM | POA: Diagnosis not present

## 2017-03-05 DIAGNOSIS — N186 End stage renal disease: Secondary | ICD-10-CM | POA: Diagnosis not present

## 2017-03-05 DIAGNOSIS — N2581 Secondary hyperparathyroidism of renal origin: Secondary | ICD-10-CM | POA: Diagnosis not present

## 2017-03-05 DIAGNOSIS — D509 Iron deficiency anemia, unspecified: Secondary | ICD-10-CM | POA: Diagnosis not present

## 2017-03-05 DIAGNOSIS — T82898A Other specified complication of vascular prosthetic devices, implants and grafts, initial encounter: Secondary | ICD-10-CM | POA: Diagnosis not present

## 2017-03-07 DIAGNOSIS — N186 End stage renal disease: Secondary | ICD-10-CM | POA: Diagnosis not present

## 2017-03-07 DIAGNOSIS — N2581 Secondary hyperparathyroidism of renal origin: Secondary | ICD-10-CM | POA: Diagnosis not present

## 2017-03-07 DIAGNOSIS — T82898A Other specified complication of vascular prosthetic devices, implants and grafts, initial encounter: Secondary | ICD-10-CM | POA: Diagnosis not present

## 2017-03-07 DIAGNOSIS — Z992 Dependence on renal dialysis: Secondary | ICD-10-CM | POA: Diagnosis not present

## 2017-03-07 DIAGNOSIS — D509 Iron deficiency anemia, unspecified: Secondary | ICD-10-CM | POA: Diagnosis not present

## 2017-03-08 DIAGNOSIS — T82898A Other specified complication of vascular prosthetic devices, implants and grafts, initial encounter: Secondary | ICD-10-CM | POA: Diagnosis not present

## 2017-03-08 DIAGNOSIS — N186 End stage renal disease: Secondary | ICD-10-CM | POA: Diagnosis not present

## 2017-03-08 DIAGNOSIS — D509 Iron deficiency anemia, unspecified: Secondary | ICD-10-CM | POA: Diagnosis not present

## 2017-03-08 DIAGNOSIS — N2581 Secondary hyperparathyroidism of renal origin: Secondary | ICD-10-CM | POA: Diagnosis not present

## 2017-03-08 DIAGNOSIS — Z992 Dependence on renal dialysis: Secondary | ICD-10-CM | POA: Diagnosis not present

## 2017-03-10 DIAGNOSIS — T82898A Other specified complication of vascular prosthetic devices, implants and grafts, initial encounter: Secondary | ICD-10-CM | POA: Diagnosis not present

## 2017-03-10 DIAGNOSIS — Z992 Dependence on renal dialysis: Secondary | ICD-10-CM | POA: Diagnosis not present

## 2017-03-10 DIAGNOSIS — N186 End stage renal disease: Secondary | ICD-10-CM | POA: Diagnosis not present

## 2017-03-10 DIAGNOSIS — D509 Iron deficiency anemia, unspecified: Secondary | ICD-10-CM | POA: Diagnosis not present

## 2017-03-10 DIAGNOSIS — N2581 Secondary hyperparathyroidism of renal origin: Secondary | ICD-10-CM | POA: Diagnosis not present

## 2017-03-11 DIAGNOSIS — L97511 Non-pressure chronic ulcer of other part of right foot limited to breakdown of skin: Secondary | ICD-10-CM | POA: Diagnosis not present

## 2017-03-12 DIAGNOSIS — T82898A Other specified complication of vascular prosthetic devices, implants and grafts, initial encounter: Secondary | ICD-10-CM | POA: Diagnosis not present

## 2017-03-12 DIAGNOSIS — N186 End stage renal disease: Secondary | ICD-10-CM | POA: Diagnosis not present

## 2017-03-12 DIAGNOSIS — D509 Iron deficiency anemia, unspecified: Secondary | ICD-10-CM | POA: Diagnosis not present

## 2017-03-12 DIAGNOSIS — Z992 Dependence on renal dialysis: Secondary | ICD-10-CM | POA: Diagnosis not present

## 2017-03-12 DIAGNOSIS — N2581 Secondary hyperparathyroidism of renal origin: Secondary | ICD-10-CM | POA: Diagnosis not present

## 2017-03-14 DIAGNOSIS — T82898A Other specified complication of vascular prosthetic devices, implants and grafts, initial encounter: Secondary | ICD-10-CM | POA: Diagnosis not present

## 2017-03-14 DIAGNOSIS — D509 Iron deficiency anemia, unspecified: Secondary | ICD-10-CM | POA: Diagnosis not present

## 2017-03-14 DIAGNOSIS — N186 End stage renal disease: Secondary | ICD-10-CM | POA: Diagnosis not present

## 2017-03-14 DIAGNOSIS — Z992 Dependence on renal dialysis: Secondary | ICD-10-CM | POA: Diagnosis not present

## 2017-03-14 DIAGNOSIS — N2581 Secondary hyperparathyroidism of renal origin: Secondary | ICD-10-CM | POA: Diagnosis not present

## 2017-03-17 DIAGNOSIS — T82898A Other specified complication of vascular prosthetic devices, implants and grafts, initial encounter: Secondary | ICD-10-CM | POA: Diagnosis not present

## 2017-03-17 DIAGNOSIS — D509 Iron deficiency anemia, unspecified: Secondary | ICD-10-CM | POA: Diagnosis not present

## 2017-03-17 DIAGNOSIS — N2581 Secondary hyperparathyroidism of renal origin: Secondary | ICD-10-CM | POA: Diagnosis not present

## 2017-03-17 DIAGNOSIS — N186 End stage renal disease: Secondary | ICD-10-CM | POA: Diagnosis not present

## 2017-03-17 DIAGNOSIS — Z992 Dependence on renal dialysis: Secondary | ICD-10-CM | POA: Diagnosis not present

## 2017-03-19 DIAGNOSIS — N186 End stage renal disease: Secondary | ICD-10-CM | POA: Diagnosis not present

## 2017-03-19 DIAGNOSIS — N2581 Secondary hyperparathyroidism of renal origin: Secondary | ICD-10-CM | POA: Diagnosis not present

## 2017-03-19 DIAGNOSIS — T82898A Other specified complication of vascular prosthetic devices, implants and grafts, initial encounter: Secondary | ICD-10-CM | POA: Diagnosis not present

## 2017-03-19 DIAGNOSIS — Z992 Dependence on renal dialysis: Secondary | ICD-10-CM | POA: Diagnosis not present

## 2017-03-19 DIAGNOSIS — D509 Iron deficiency anemia, unspecified: Secondary | ICD-10-CM | POA: Diagnosis not present

## 2017-03-21 DIAGNOSIS — Z992 Dependence on renal dialysis: Secondary | ICD-10-CM | POA: Diagnosis not present

## 2017-03-21 DIAGNOSIS — N186 End stage renal disease: Secondary | ICD-10-CM | POA: Diagnosis not present

## 2017-03-21 DIAGNOSIS — N2581 Secondary hyperparathyroidism of renal origin: Secondary | ICD-10-CM | POA: Diagnosis not present

## 2017-03-21 DIAGNOSIS — T82898A Other specified complication of vascular prosthetic devices, implants and grafts, initial encounter: Secondary | ICD-10-CM | POA: Diagnosis not present

## 2017-03-21 DIAGNOSIS — D509 Iron deficiency anemia, unspecified: Secondary | ICD-10-CM | POA: Diagnosis not present

## 2017-03-24 ENCOUNTER — Encounter (HOSPITAL_COMMUNITY): Payer: Self-pay | Admitting: Emergency Medicine

## 2017-03-24 ENCOUNTER — Emergency Department (HOSPITAL_COMMUNITY)
Admission: EM | Admit: 2017-03-24 | Discharge: 2017-03-24 | Disposition: A | Discharge status: Home / Self Care | Payer: Medicare Other | Attending: Emergency Medicine | Admitting: Emergency Medicine

## 2017-03-24 ENCOUNTER — Other Ambulatory Visit: Payer: Self-pay

## 2017-03-24 DIAGNOSIS — N186 End stage renal disease: Secondary | ICD-10-CM | POA: Insufficient documentation

## 2017-03-24 DIAGNOSIS — Z87891 Personal history of nicotine dependence: Secondary | ICD-10-CM | POA: Diagnosis not present

## 2017-03-24 DIAGNOSIS — Z8673 Personal history of transient ischemic attack (TIA), and cerebral infarction without residual deficits: Secondary | ICD-10-CM | POA: Diagnosis not present

## 2017-03-24 DIAGNOSIS — Z79899 Other long term (current) drug therapy: Secondary | ICD-10-CM | POA: Insufficient documentation

## 2017-03-24 DIAGNOSIS — R110 Nausea: Secondary | ICD-10-CM | POA: Insufficient documentation

## 2017-03-24 DIAGNOSIS — Z992 Dependence on renal dialysis: Secondary | ICD-10-CM | POA: Diagnosis not present

## 2017-03-24 DIAGNOSIS — I120 Hypertensive chronic kidney disease with stage 5 chronic kidney disease or end stage renal disease: Secondary | ICD-10-CM | POA: Insufficient documentation

## 2017-03-24 DIAGNOSIS — D509 Iron deficiency anemia, unspecified: Secondary | ICD-10-CM | POA: Diagnosis not present

## 2017-03-24 DIAGNOSIS — T82898A Other specified complication of vascular prosthetic devices, implants and grafts, initial encounter: Secondary | ICD-10-CM | POA: Diagnosis not present

## 2017-03-24 DIAGNOSIS — N2581 Secondary hyperparathyroidism of renal origin: Secondary | ICD-10-CM | POA: Diagnosis not present

## 2017-03-24 LAB — COMPREHENSIVE METABOLIC PANEL
ALBUMIN: 3.7 g/dL (ref 3.5–5.0)
ALK PHOS: 98 U/L (ref 38–126)
ALT: 20 U/L (ref 17–63)
ANION GAP: 12 (ref 5–15)
AST: 30 U/L (ref 15–41)
BILIRUBIN TOTAL: 0.5 mg/dL (ref 0.3–1.2)
BUN: 19 mg/dL (ref 6–20)
CALCIUM: 8.4 mg/dL — AB (ref 8.9–10.3)
CO2: 28 mmol/L (ref 22–32)
Chloride: 92 mmol/L — ABNORMAL LOW (ref 101–111)
Creatinine, Ser: 6.43 mg/dL — ABNORMAL HIGH (ref 0.61–1.24)
GFR calc Af Amer: 9 mL/min — ABNORMAL LOW (ref >60)
GFR calc non Af Amer: 8 mL/min — ABNORMAL LOW (ref >60)
GLUCOSE: 120 mg/dL — AB (ref 65–99)
Potassium: 2.9 mmol/L — ABNORMAL LOW (ref 3.5–5.1)
SODIUM: 132 mmol/L — AB (ref 135–145)
TOTAL PROTEIN: 8.5 g/dL — AB (ref 6.5–8.1)

## 2017-03-24 LAB — CBC WITH DIFFERENTIAL/PLATELET
BASOS ABS: 0.1 10*3/uL (ref 0.0–0.1)
BASOS PCT: 0 %
Eosinophils Absolute: 0.5 10*3/uL (ref 0.0–0.7)
Eosinophils Relative: 4 %
HEMATOCRIT: 42.4 % (ref 39.0–52.0)
HEMOGLOBIN: 13.4 g/dL (ref 13.0–17.0)
LYMPHS PCT: 22 %
Lymphs Abs: 2.6 10*3/uL (ref 0.7–4.0)
MCH: 21.2 pg — ABNORMAL LOW (ref 26.0–34.0)
MCHC: 31.6 g/dL (ref 30.0–36.0)
MCV: 67 fL — ABNORMAL LOW (ref 78.0–100.0)
MONO ABS: 0.8 10*3/uL (ref 0.1–1.0)
Monocytes Relative: 7 %
NEUTROS PCT: 67 %
Neutro Abs: 7.7 10*3/uL (ref 1.7–7.7)
Platelets: 289 10*3/uL (ref 150–400)
RBC: 6.33 MIL/uL — AB (ref 4.22–5.81)
RDW: 18.1 % — ABNORMAL HIGH (ref 11.5–15.5)
WBC: 11.6 10*3/uL — AB (ref 4.0–10.5)

## 2017-03-24 LAB — LIPASE, BLOOD: Lipase: 30 U/L (ref 11–51)

## 2017-03-24 MED ORDER — ONDANSETRON 8 MG PO TBDP
8.0000 mg | ORAL_TABLET | Freq: Once | ORAL | Status: AC
  Administered 2017-03-24: 8 mg via ORAL
  Filled 2017-03-24: qty 1

## 2017-03-24 MED ORDER — ONDANSETRON HCL 8 MG PO TABS: 8.0000 mg | ORAL_TABLET | Freq: Three times a day (TID) | ORAL | 0 refills | Status: DC | PRN

## 2017-03-24 NOTE — Discharge Instructions (Signed)
Start with a clear liquid diet this evening then gradually advance to regular foods, over the next day or 2.  Return here, if needed, for problems.

## 2017-03-24 NOTE — ED Notes (Signed)
Drinking without difficulty.  Denies any nausea or pain at this time.

## 2017-03-24 NOTE — ED Triage Notes (Signed)
PT c/o upper abdominal discomfort with nausea and cough that started after dialysis treatment.

## 2017-03-24 NOTE — ED Provider Notes (Signed)
[facility] EMERGENCY DEPARTMENT Provider Note   CSN: [id number] Arrival date & time: 03/24/17  1427     History   Chief Complaint Chief Complaint  Patient presents with  . Abdominal Pain    HPI CORD [name] Sr. is a 76 y.o. male.  Patient presents for evaluation of nausea and "dry heaves."  The started today after he completed his dialysis treatment and was driving to pick up a family member.  He had a normal breakfast this morning and normal bowel.  No vomiting, diarrhea, fever, chills, weakness or dizziness.  He is taking his usual medications.  There are no other known modifying factors.  HPI  Past Medical History:  Diagnosis Date  . Anxiety   . Arthritis   . Bifascicular block   . Cardiomyopathy ([place])   . CVA (cerebral infarction)   . Dialysis patient ([place])   . ESRD (end stage renal disease) on dialysis [facility])    M/W/F at [facility] in [place]  . Gastric ulcer 2004   treated for h.pylori see PSH  . Hypertension   . Low iron   . LVH (left ventricular hypertrophy)   . Stroke [facility])    left side weakness    Patient Active Problem List   Diagnosis Date Noted  . Nonischemic cardiomyopathy ([place]) 08/15/2015  . Aortic stenosis 08/15/2015  . Moderate aortic stenosis 07/12/2015  . 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 CAD- 40-50% LAD at cath 07/11/15 07/11/2015  . Aspirin intolerance 07/11/2015  . Abnormal stress test   . ESRD on dialysis ([place]) 05/30/2015  . CTS (carpal tunnel syndrome) 02/28/2015  . PVD of LE - Dr Oneida Alar follows 08/20/2012  . Ulcer of foot ([place]) 01/16/2012  . Encounter for screening colonoscopy 10/08/2011  . Melena 10/08/2011  . Other complications due to renal dialysis device, implant, and graft 09/24/2011    Past Surgical History:  Procedure Laterality Date  . ABDOMINAL AORTAGRAM N/A 01/24/2012   Procedure: ABDOMINAL Maxcine Ham;  Surgeon: Elam Dutch, MD;  Location: [facility] CATH LAB;  Service: Cardiovascular;  Laterality: N/A;    . ARTERIOVENOUS GRAFT PLACEMENT Right right arm  . AV FISTULA PLACEMENT Left 08/31/2015   Procedure: ARTERIOVENOUS (AV) FISTULA CREATION- LEFT RADIOCEPHALIC;  Surgeon: Mal Misty, MD;  Location: [place];  Service: Vascular;  Laterality: Left;  . AV FISTULA PLACEMENT Right 02/18/2017   Procedure: INSERTION OF ARTERIOVENOUS (AV) GORE-TEX GRAFT  RIGHT UPPER ARM;  Surgeon: Conrad Carlisle, MD;  Location: [place];  Service: Vascular;  Laterality: Right;  . BASCILIC VEIN TRANSPOSITION Left 12/19/2015   Procedure: FIRST STAGE BRACHIAL VEIN TRANSPOSITION;  Surgeon: Conrad Elk, MD;  Location: [place];  Service: Vascular;  Laterality: Left;  . BASCILIC VEIN TRANSPOSITION Left 02/08/2016   Procedure: SECOND STAGE BRACHIAL VEIN TRANSPOSITION;  Surgeon: Conrad Grainfield, MD;  Location: [place];  Service: Vascular;  Laterality: Left;  . CARDIAC CATHETERIZATION N/A 07/11/2015   Procedure: Left Heart Cath and Coronary Angiography;  Surgeon: Troy Sine, MD;  Location: [place] CV LAB;  Service: Cardiovascular;  Laterality: N/A;  . Carpel Tunnel Left Dec. 22, 2016  . CHOLECYSTECTOMY    . COLONOSCOPY  2004   Dr. Irving Shows, left sided diverticula and cecal polyp, path unknown  . COLONOSCOPY  10/29/2011   Procedure: COLONOSCOPY;  Surgeon: Daneil Dolin, MD;  [facility];  Service: Endoscopy;  Laterality: N/A;  10:15  . ESOPHAGOGASTRODUODENOSCOPY  11/2002   Dr. Gala Romney, erosive  reflux esophagitis, multiple gastric ulcer and antral/bulbar erosions. Serologies positive for H.Pylori and was treated  . ESOPHAGOGASTRODUODENOSCOPY  11/20014   Dr. Gala Romney, small hh only, ulcers healed  . ESOPHAGOGASTRODUODENOSCOPY  09/21/2011   Dr Trevor Iha [facility], antral erosions, ?early GAVE  . FISTULOGRAM Left 12/10/2016   Procedure: THROMBECTOMY OF LEFT ARM ARTERIOVENOUS FISTULA;  Surgeon: Waynetta Sandy, MD;  Location: [place];  Service: Vascular;  Laterality: Left;  . INSERTION OF DIALYSIS CATHETER Left 12/10/2016    Procedure: INSERTION OF TUNNELED DIALYSIS CATHETER;  Surgeon: Waynetta Sandy, MD;  Location: [place];  Service: Vascular;  Laterality: Left;  . IR GENERIC HISTORICAL  07/16/2016   IR REMOVAL TUN CV CATH W/O FL 07/16/2016 Saverio Danker, PA-C [facility]-INTERV RAD  . LIGATION OF ARTERIOVENOUS  FISTULA Left 12/19/2015   Procedure: LIGATION OF RADIOCEPHALIC ARTERIOVENOUS  FISTULA;  Surgeon: Conrad Elliston, MD;  Location: [place];  Service: Vascular;  Laterality: Left;  Marland Kitchen MASS EXCISION Right 02/18/2017   Procedure: EXCISION OF RIGHT AXILLARY EPIDERMAL INCLUSION CYST;  Surgeon: Conrad West Pelzer, MD;  Location: [place];  Service: Vascular;  Laterality: Right;  . PERIPHERAL VASCULAR CATHETERIZATION N/A 12/14/2015   Procedure: Fistulagram;  Surgeon: Conrad Oak Grove, MD;  Location: [place] CV LAB;  Service: Cardiovascular;  Laterality: N/A;  . SHOULDER SURGERY    . UPPER EXTREMITY VENOGRAPHY Bilateral 12/17/2016   Procedure: Bilateral Upper Extremity Venography;  Surgeon: Serafina Mitchell, MD;  Location: [place] CV LAB;  Service: Cardiovascular;  Laterality: Bilateral;       Home Medications    Prior to Admission medications   Medication Sig Start Date End Date Taking? Authorizing Provider  ALPRAZolam Duanne Moron) 0.5 MG tablet Take 0.5 mg by mouth daily. 10/06/15  Yes [provider]  atorvastatin (LIPITOR) 40 MG tablet Take 40 mg by mouth daily. 11/03/15  Yes [provider]  b complex-vitamin c-folic acid (NEPHRO-VITE) 0.8 MG TABS Take 0.8 mg by mouth See admin instructions. Takes on Tuesdays, Thursdays, Saturdays, and Sundays. Does not take on Mondays, Wednesdays, and Fridays due to Dialysis treatments.   Yes [provider]  cinacalcet (SENSIPAR) 60 MG tablet Take 60 mg by mouth every evening. With evening meal   Yes [provider]  loratadine (CLARITIN) 10 MG tablet Take 1 tablet (10 mg total) by mouth every other day as needed for allergies. 11/08/15  Yes [name], MD    meclizine (ANTIVERT) 25 MG tablet Take 25 mg by mouth daily as needed for dizziness (Non dialysis days).    Yes [provider]  promethazine (PHENERGAN) 25 MG tablet Take 25 mg by mouth every 8 (eight) hours as needed for nausea or vomiting.  08/03/10  Yes [provider]  sevelamer (RENVELA) 800 MG tablet Take 16 mg by mouth See admin instructions. Take 2400  mg by mouth 3 times daily with full meals and take 1600 mg by mouth with snacks.   Yes [provider]  ondansetron (ZOFRAN) 8 MG tablet Take 1 tablet (8 mg total) by mouth every 8 (eight) hours as needed for nausea or vomiting. 03/24/17   Daleen Bo, MD  oxyCODONE-acetaminophen (ROXICET) 5-325 MG tablet Take 1 tablet by mouth every 6 (six) hours as needed. Patient not taking: Reported on 03/24/2017 02/18/17 02/18/18  Conrad Ludlow, MD    Family History Family History  Problem Relation Age of Onset  . Hypertension Mother   . Colon cancer Neg Hx   . Liver disease Neg  Hx     Social History Social History   Tobacco Use  . Smoking status: Former Smoker    Last attempt to quit: 04/29/2004    Years since quitting: 12.9  . Smokeless tobacco: Former Systems developer    Types: Chew    Quit date: 01/16/1987  . Tobacco comment: quit 2006  Substance Use Topics  . Alcohol use: No  . Drug use: No     Allergies   Aspirin   Review of Systems Review of Systems  All other systems reviewed and are negative.    Physical Exam Updated Vital Signs BP (!) 141/77   Pulse 93   Temp 97.8 F (36.6 C) (Oral)   Resp 18   Ht 5\' 5"  (1.651 m)   Wt 77.1 kg (170 lb)   SpO2 97%   BMI 28.29 kg/m   Physical Exam  Constitutional: He is oriented to person, place, and time. He appears well-developed and well-nourished.  HENT:  Head: Normocephalic and atraumatic.  Right Ear: External ear normal.  Left Ear: External ear normal.  Eyes: Conjunctivae and EOM are normal. Pupils are equal, round, and reactive to light.  Neck:  Normal range of motion and phonation normal. Neck supple.  Cardiovascular: Normal rate, regular rhythm and normal heart sounds.  Dialysis catheter left upper chest wall, site and appliance appears normal  Pulmonary/Chest: Effort normal and breath sounds normal. He exhibits no bony tenderness.  Abdominal: Soft. There is no splenomegaly or hepatomegaly. There is no tenderness. There is no rigidity, no rebound and no guarding. No hernia.  Musculoskeletal: Normal range of motion.  Neurological: He is alert and oriented to person, place, and time. No cranial nerve deficit or sensory deficit. He exhibits normal muscle tone. Coordination normal.  Skin: Skin is warm, dry and intact.  Psychiatric: He has a normal mood and affect. His behavior is normal. Judgment and thought content normal.  Nursing note and vitals reviewed.    ED Treatments / Results  Labs (all labs ordered are listed, but only abnormal results are displayed) Labs Reviewed  CBC WITH DIFFERENTIAL/PLATELET - Abnormal; Notable for the following components:      Result Value   WBC 11.6 (*)    RBC 6.33 (*)    MCV 67.0 (*)    MCH 21.2 (*)    RDW 18.1 (*)    All other components within normal limits  COMPREHENSIVE METABOLIC PANEL - Abnormal; Notable for the following components:   Sodium 132 (*)    Potassium 2.9 (*)    Chloride 92 (*)    Glucose, Bld 120 (*)    Creatinine, Ser 6.43 (*)    Calcium 8.4 (*)    Total Protein 8.5 (*)    GFR calc non Af Amer 8 (*)    GFR calc Af Amer 9 (*)    All other components within normal limits  LIPASE, BLOOD    EKG  EKG Interpretation  Date/Time:  Monday March 24 2017 14:40:57 EST Ventricular Rate:  97 PR Interval:  158 QRS Duration: 166 QT Interval:  446 QTC Calculation: 566 R Axis:   85 Text Interpretation:  Sinus rhythm with frequent Premature ventricular complexes Right bundle branch block Abnormal ECG since last tracing no significant change Confirmed by Daleen Bo  [phone]) on 03/24/2017 4:44:42 PM       Radiology No results found.  Procedures Procedures (including critical care time)  Medications Ordered in ED Medications  ondansetron (ZOFRAN-ODT) disintegrating tablet 8 mg (8 mg  Oral Given 03/24/17 1706)     Initial Impression / Assessment and Plan / ED Course  I have reviewed the triage vital signs and the nursing notes.  Pertinent labs & imaging results that were available during my care of the patient were reviewed by me and considered in my medical decision making (see chart for details).  Clinical Course as of Mar 25 1835  Mon Mar 24, 2017  1835 Mild elevation WBC: (!) 11.6 [EW]  1835 Normal Hemoglobin: 13.4 [EW]  1835 Low, post dialysis Potassium: (!) 2.9 [EW]  1835 Elevated consistent with end-stage renal disease Creatinine: (!) 6.43 [EW]  1835 Slightly high BP: (!) 141/77 [EW]    Clinical Course User Index [EW] Daleen Bo, MD     Patient Vitals for the past 24 hrs:  BP Temp Temp src Pulse Resp SpO2 Height Weight  03/24/17 1730 (!) 141/77 - - 93 - 97 % - -  03/24/17 1701 (!) 141/89 - - 88 18 96 % - -  03/24/17 1700 (!) 141/89 - - 91 - 98 % - -  03/24/17 1436 - - - - - - 5\' 5"  (1.651 m) 77.1 kg (170 lb)  03/24/17 1435 (!) 154/76 97.8 F (36.6 C) Oral 92 (!) 22 96 % - -    6:34 PM Reevaluation with update and discussion. After initial assessment and treatment, an updated evaluation reveals he states he is feeling better at this time.  He is now tolerating oral liquids.  Orthostatics were negative.  Findings discussed with patient and family member, all questions answered. Daleen Bo      Final Clinical Impressions(s) / ED Diagnoses   Final diagnoses:  Nausea   Nonspecific nausea, improved, after given antiemetic.  Screening evaluation is reassuring.  Potassium low postdialysis, does not require treatment at this time.  Nonspecific white blood cell count elevation.  Doubt serious bacterial infection metabolic  instability or impending vascular collapse.  Nursing Notes Reviewed/ Care Coordinated Applicable Imaging Reviewed Interpretation of Laboratory Data incorporated into ED treatment  The patient appears reasonably screened and/or stabilized for discharge and I doubt any other medical condition or other [facility] requiring further screening, evaluation, or treatment in the ED at this time prior to discharge.  Plan: Home Medications-continue usual medications; Home Treatments-gradually advance diet; return here if the recommended treatment, does not improve the symptoms; Recommended follow up-PCP, as needed   ED Discharge Orders        Ordered    ondansetron (ZOFRAN) 8 MG tablet  Every 8 hours PRN     03/24/17 1833       Daleen Bo, MD 03/24/17 1836

## 2017-03-24 NOTE — ED Notes (Signed)
Pt given water per fluid challenge.  

## 2017-03-26 DIAGNOSIS — N2581 Secondary hyperparathyroidism of renal origin: Secondary | ICD-10-CM | POA: Diagnosis not present

## 2017-03-26 DIAGNOSIS — N186 End stage renal disease: Secondary | ICD-10-CM | POA: Diagnosis not present

## 2017-03-26 DIAGNOSIS — D509 Iron deficiency anemia, unspecified: Secondary | ICD-10-CM | POA: Diagnosis not present

## 2017-03-26 DIAGNOSIS — T82898A Other specified complication of vascular prosthetic devices, implants and grafts, initial encounter: Secondary | ICD-10-CM | POA: Diagnosis not present

## 2017-03-26 DIAGNOSIS — Z992 Dependence on renal dialysis: Secondary | ICD-10-CM | POA: Diagnosis not present

## 2017-03-28 DIAGNOSIS — T82898A Other specified complication of vascular prosthetic devices, implants and grafts, initial encounter: Secondary | ICD-10-CM | POA: Diagnosis not present

## 2017-03-28 DIAGNOSIS — N186 End stage renal disease: Secondary | ICD-10-CM | POA: Diagnosis not present

## 2017-03-28 DIAGNOSIS — D509 Iron deficiency anemia, unspecified: Secondary | ICD-10-CM | POA: Diagnosis not present

## 2017-03-28 DIAGNOSIS — Z992 Dependence on renal dialysis: Secondary | ICD-10-CM | POA: Diagnosis not present

## 2017-03-28 DIAGNOSIS — N2581 Secondary hyperparathyroidism of renal origin: Secondary | ICD-10-CM | POA: Diagnosis not present

## 2017-03-29 DIAGNOSIS — N2581 Secondary hyperparathyroidism of renal origin: Secondary | ICD-10-CM | POA: Diagnosis not present

## 2017-03-29 DIAGNOSIS — M86171 Other acute osteomyelitis, right ankle and foot: Secondary | ICD-10-CM | POA: Diagnosis not present

## 2017-03-29 DIAGNOSIS — Z992 Dependence on renal dialysis: Secondary | ICD-10-CM | POA: Diagnosis not present

## 2017-03-29 DIAGNOSIS — N186 End stage renal disease: Secondary | ICD-10-CM | POA: Diagnosis not present

## 2017-03-31 ENCOUNTER — Other Ambulatory Visit: Payer: Self-pay

## 2017-03-31 ENCOUNTER — Emergency Department (HOSPITAL_COMMUNITY): Payer: Medicare Other

## 2017-03-31 ENCOUNTER — Emergency Department (HOSPITAL_COMMUNITY)
Admission: EM | Admit: 2017-03-31 | Discharge: 2017-03-31 | Disposition: A | Discharge status: Home / Self Care | Payer: Medicare Other | Attending: Emergency Medicine | Admitting: Emergency Medicine

## 2017-03-31 ENCOUNTER — Encounter (HOSPITAL_COMMUNITY): Payer: Self-pay | Admitting: *Deleted

## 2017-03-31 DIAGNOSIS — I429 Cardiomyopathy, unspecified: Secondary | ICD-10-CM | POA: Insufficient documentation

## 2017-03-31 DIAGNOSIS — N2581 Secondary hyperparathyroidism of renal origin: Secondary | ICD-10-CM | POA: Diagnosis not present

## 2017-03-31 DIAGNOSIS — M86171 Other acute osteomyelitis, right ankle and foot: Secondary | ICD-10-CM | POA: Diagnosis not present

## 2017-03-31 DIAGNOSIS — Z8673 Personal history of transient ischemic attack (TIA), and cerebral infarction without residual deficits: Secondary | ICD-10-CM | POA: Insufficient documentation

## 2017-03-31 DIAGNOSIS — N186 End stage renal disease: Secondary | ICD-10-CM | POA: Diagnosis not present

## 2017-03-31 DIAGNOSIS — J189 Pneumonia, unspecified organism: Secondary | ICD-10-CM | POA: Diagnosis not present

## 2017-03-31 DIAGNOSIS — Z87891 Personal history of nicotine dependence: Secondary | ICD-10-CM | POA: Diagnosis not present

## 2017-03-31 DIAGNOSIS — Z79899 Other long term (current) drug therapy: Secondary | ICD-10-CM | POA: Diagnosis not present

## 2017-03-31 DIAGNOSIS — R05 Cough: Secondary | ICD-10-CM | POA: Diagnosis not present

## 2017-03-31 DIAGNOSIS — Z992 Dependence on renal dialysis: Secondary | ICD-10-CM | POA: Insufficient documentation

## 2017-03-31 DIAGNOSIS — I120 Hypertensive chronic kidney disease with stage 5 chronic kidney disease or end stage renal disease: Secondary | ICD-10-CM | POA: Diagnosis not present

## 2017-03-31 DIAGNOSIS — R531 Weakness: Secondary | ICD-10-CM | POA: Diagnosis present

## 2017-03-31 LAB — CBC WITH DIFFERENTIAL/PLATELET
BASOS ABS: 0.1 10*3/uL (ref 0.0–0.1)
BASOS PCT: 1 %
EOS ABS: 0.6 10*3/uL (ref 0.0–0.7)
EOS PCT: 7 %
HCT: 38.1 % — ABNORMAL LOW (ref 39.0–52.0)
Hemoglobin: 11.9 g/dL — ABNORMAL LOW (ref 13.0–17.0)
Lymphocytes Relative: 27 %
Lymphs Abs: 2.1 10*3/uL (ref 0.7–4.0)
MCH: 20.8 pg — ABNORMAL LOW (ref 26.0–34.0)
MCHC: 31.2 g/dL (ref 30.0–36.0)
MCV: 66.7 fL — ABNORMAL LOW (ref 78.0–100.0)
Monocytes Absolute: 0.6 10*3/uL (ref 0.1–1.0)
Monocytes Relative: 8 %
Neutro Abs: 4.4 10*3/uL (ref 1.7–7.7)
Neutrophils Relative %: 57 %
PLATELETS: 223 10*3/uL (ref 150–400)
RBC: 5.71 MIL/uL (ref 4.22–5.81)
RDW: 17.5 % — ABNORMAL HIGH (ref 11.5–15.5)
WBC: 7.8 10*3/uL (ref 4.0–10.5)

## 2017-03-31 LAB — COMPREHENSIVE METABOLIC PANEL
ALT: 20 U/L (ref 17–63)
AST: 24 U/L (ref 15–41)
Albumin: 3.3 g/dL — ABNORMAL LOW (ref 3.5–5.0)
Alkaline Phosphatase: 88 U/L (ref 38–126)
Anion gap: 12 (ref 5–15)
BUN: 43 mg/dL — AB (ref 6–20)
CHLORIDE: 101 mmol/L (ref 101–111)
CO2: 24 mmol/L (ref 22–32)
CREATININE: 10.48 mg/dL — AB (ref 0.61–1.24)
Calcium: 8.2 mg/dL — ABNORMAL LOW (ref 8.9–10.3)
GFR calc non Af Amer: 4 mL/min — ABNORMAL LOW (ref >60)
GFR, EST AFRICAN AMERICAN: 5 mL/min — AB (ref >60)
Glucose, Bld: 89 mg/dL (ref 65–99)
POTASSIUM: 3.7 mmol/L (ref 3.5–5.1)
SODIUM: 137 mmol/L (ref 135–145)
Total Bilirubin: 0.5 mg/dL (ref 0.3–1.2)
Total Protein: 7.2 g/dL (ref 6.5–8.1)

## 2017-03-31 LAB — I-STAT TROPONIN, ED: Troponin i, poc: 0.08 ng/mL (ref 0.00–0.08)

## 2017-03-31 MED ORDER — LEVOFLOXACIN 750 MG PO TABS
750.0000 mg | ORAL_TABLET | Freq: Once | ORAL | Status: AC
  Administered 2017-03-31: 750 mg via ORAL
  Filled 2017-03-31: qty 1

## 2017-03-31 MED ORDER — LEVOFLOXACIN 500 MG PO TABS: 500.0000 mg | ORAL_TABLET | ORAL | 0 refills | Status: DC

## 2017-03-31 NOTE — ED Provider Notes (Signed)
[facility] EMERGENCY DEPARTMENT Provider Note   CSN: [id number] Arrival date & time: 03/31/17  0126     History   Chief Complaint Chief Complaint  Patient presents with  . Weakness    HPI Joseph CAMBRIDGE Sr. is a 76 y.o. male.  Patient presents to the emergency department for evaluation of generalized weakness.  Patient reports "I just feel bad".  He reports his symptoms have been ongoing for about a week.  He was seen in the ER recently for nausea and dry heaves.  Those symptoms have resolved, but over the last few days he has been feeling very weak, occasionally dizzy and short of breath.  He is a dialysis patient, last dialyzed 2 days ago.  He is due for dialysis in the morning.  He is not experiencing chest pain.      Past Medical History:  Diagnosis Date  . Anxiety   . Arthritis   . Bifascicular block   . Cardiomyopathy ([place])   . CVA (cerebral infarction)   . Dialysis patient ([place])   . ESRD (end stage renal disease) on dialysis [facility])    M/W/F at [facility] in [place]  . Gastric ulcer 2004   treated for h.pylori see PSH  . Hypertension   . Low iron   . LVH (left ventricular hypertrophy)   . Stroke [facility])    left side weakness    Patient Active Problem List   Diagnosis Date Noted  . Nonischemic cardiomyopathy ([place]) 08/15/2015  . Aortic stenosis 08/15/2015  . Moderate aortic stenosis 07/12/2015  . 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 CAD- 40-50% LAD at cath 07/11/15 07/11/2015  . Aspirin intolerance 07/11/2015  . Abnormal stress test   . ESRD on dialysis ([place]) 05/30/2015  . CTS (carpal tunnel syndrome) 02/28/2015  . PVD of LE - Dr Oneida Alar follows 08/20/2012  . Ulcer of foot ([place]) 01/16/2012  . Encounter for screening colonoscopy 10/08/2011  . Melena 10/08/2011  . Other complications due to renal dialysis device, implant, and graft 09/24/2011    Past Surgical History:  Procedure Laterality Date  . ABDOMINAL AORTAGRAM N/A 01/24/2012   Procedure: ABDOMINAL Maxcine Ham;  Surgeon: Elam Dutch, MD;  Location: [facility] CATH LAB;  Service: Cardiovascular;  Laterality: N/A;  . ARTERIOVENOUS GRAFT PLACEMENT Right right arm  . AV FISTULA PLACEMENT Left 08/31/2015   Procedure: ARTERIOVENOUS (AV) FISTULA CREATION- LEFT RADIOCEPHALIC;  Surgeon: Mal Misty, MD;  Location: [place];  Service: Vascular;  Laterality: Left;  . AV FISTULA PLACEMENT Right 02/18/2017   Procedure: INSERTION OF ARTERIOVENOUS (AV) GORE-TEX GRAFT  RIGHT UPPER ARM;  Surgeon: Conrad Godley, MD;  Location: [place];  Service: Vascular;  Laterality: Right;  . BASCILIC VEIN TRANSPOSITION Left 12/19/2015   Procedure: FIRST STAGE BRACHIAL VEIN TRANSPOSITION;  Surgeon: Conrad [place], MD;  Location: [place];  Service: Vascular;  Laterality: Left;  . BASCILIC VEIN TRANSPOSITION Left 02/08/2016   Procedure: SECOND STAGE BRACHIAL VEIN TRANSPOSITION;  Surgeon: Conrad Mascotte, MD;  Location: [place];  Service: Vascular;  Laterality: Left;  . CARDIAC CATHETERIZATION N/A 07/11/2015   Procedure: Left Heart Cath and Coronary Angiography;  Surgeon: Troy Sine, MD;  Location: [place] CV LAB;  Service: Cardiovascular;  Laterality: N/A;  . Carpel Tunnel Left Dec. 22, 2016  . CHOLECYSTECTOMY    . COLONOSCOPY  2004   Dr. Irving Shows, left sided diverticula and cecal polyp, path unknown  . COLONOSCOPY  10/29/2011   Procedure: COLONOSCOPY;  Surgeon: Daneil Dolin, MD;  [facility];  Service: Endoscopy;  Laterality: N/A;  10:15  . ESOPHAGOGASTRODUODENOSCOPY  11/2002   Dr. Gala Romney, erosive reflux esophagitis, multiple gastric ulcer and antral/bulbar erosions. Serologies positive for H.Pylori and was treated  . ESOPHAGOGASTRODUODENOSCOPY  11/20014   Dr. Gala Romney, small hh only, ulcers healed  . ESOPHAGOGASTRODUODENOSCOPY  09/21/2011   Dr Trevor Iha HH, antral erosions, ?early GAVE  . FISTULOGRAM Left 12/10/2016   Procedure: THROMBECTOMY OF LEFT ARM ARTERIOVENOUS FISTULA;  Surgeon: Waynetta Sandy, MD;  Location: [place];  Service: Vascular;  Laterality: Left;  . INSERTION OF DIALYSIS CATHETER Left 12/10/2016   Procedure: INSERTION OF TUNNELED DIALYSIS CATHETER;  Surgeon: Waynetta Sandy, MD;  Location: [place];  Service: Vascular;  Laterality: Left;  . IR GENERIC HISTORICAL  07/16/2016   IR REMOVAL TUN CV CATH W/O FL 07/16/2016 Saverio Danker, PA-C [facility]-INTERV RAD  . LIGATION OF ARTERIOVENOUS  FISTULA Left 12/19/2015   Procedure: LIGATION OF RADIOCEPHALIC ARTERIOVENOUS  FISTULA;  Surgeon: Conrad Homeland, MD;  Location: [place];  Service: Vascular;  Laterality: Left;  Marland Kitchen MASS EXCISION Right 02/18/2017   Procedure: EXCISION OF RIGHT AXILLARY EPIDERMAL INCLUSION CYST;  Surgeon: Conrad Hesston, MD;  Location: [place];  Service: Vascular;  Laterality: Right;  . PERIPHERAL VASCULAR CATHETERIZATION N/A 12/14/2015   Procedure: Fistulagram;  Surgeon: Conrad Oak[place], MD;  Location: [place] CV LAB;  Service: Cardiovascular;  Laterality: N/A;  . SHOULDER SURGERY    . UPPER EXTREMITY VENOGRAPHY Bilateral 12/17/2016   Procedure: Bilateral Upper Extremity Venography;  Surgeon: Serafina Mitchell, MD;  Location: [place] CV LAB;  Service: Cardiovascular;  Laterality: Bilateral;       Home Medications    Prior to Admission medications   Medication Sig Start Date End Date Taking? Authorizing Provider  ALPRAZolam Duanne Moron) 0.5 MG tablet Take 0.5 mg by mouth daily. 10/06/15   [provider]  atorvastatin (LIPITOR) 40 MG tablet Take 40 mg by mouth daily. 11/03/15   [provider]  b complex-vitamin c-folic acid (NEPHRO-VITE) 0.8 MG TABS Take 0.8 mg by mouth See admin instructions. Takes on Tuesdays, Thursdays, Saturdays, and Sundays. Does not take on Mondays, Wednesdays, and Fridays due to Dialysis treatments.    [provider]  cinacalcet (SENSIPAR) 60 MG tablet Take 60 mg by mouth every evening. With evening meal    [provider]  levofloxacin (LEVAQUIN) 500 MG  tablet Take 1 tablet (500 mg total) by mouth every other day. Take first dose on December 5, take after dialysis 03/31/17   Orpah Greek, MD  loratadine (CLARITIN) 10 MG tablet Take 1 tablet (10 mg total) by mouth every other day as needed for allergies. 11/08/15   [name], MD  meclizine (ANTIVERT) 25 MG tablet Take 25 mg by mouth daily as needed for dizziness (Non dialysis days).     [provider]  ondansetron (ZOFRAN) 8 MG tablet Take 1 tablet (8 mg total) by mouth every 8 (eight) hours as needed for nausea or vomiting. 03/24/17   Daleen Bo, MD  oxyCODONE-acetaminophen (ROXICET) 5-325 MG tablet Take 1 tablet by mouth every 6 (six) hours as needed. Patient not taking: Reported on 03/24/2017 02/18/17 02/18/18  Conrad [place], MD  promethazine (PHENERGAN) 25 MG tablet Take 25 mg by mouth every 8 (eight) hours as needed for nausea or vomiting.  08/03/10   [provider]  sevelamer (RENVELA) 800 MG tablet Take 16 mg by  mouth See admin instructions. Take 2400  mg by mouth 3 times daily with full meals and take 1600 mg by mouth with snacks.    [provider]    Family History Family History  Problem Relation Age of Onset  . Hypertension Mother   . Colon cancer Neg Hx   . Liver disease Neg Hx     Social History Social History   Tobacco Use  . Smoking status: Former Smoker    Last attempt to quit: 04/29/2004    Years since quitting: 12.9  . Smokeless tobacco: Former Systems developer    Types: Chew    Quit date: 01/16/1987  . Tobacco comment: quit 2006  Substance Use Topics  . Alcohol use: No  . Drug use: No     Allergies   Aspirin   Review of Systems Review of Systems  Respiratory: Positive for shortness of breath.   Cardiovascular: Negative for chest pain.  Neurological: Positive for dizziness and weakness.  All other systems reviewed and are negative.    Physical Exam Updated Vital Signs BP 121/73   Pulse 83   Temp (!) 97.5 F (36.4 C)  (Oral)   Resp (!) 21   Ht 5\' 5"  (1.651 m)   Wt 77.1 kg (170 lb)   SpO2 97%   BMI 28.29 kg/m   Physical Exam  Constitutional: He is oriented to person, place, and time. He appears well-developed and well-nourished. No distress.  HENT:  Head: Normocephalic and atraumatic.  Right Ear: Hearing normal.  Left Ear: Hearing normal.  Nose: Nose normal.  Mouth/Throat: Oropharynx is clear and moist and mucous membranes are normal.  Eyes: Conjunctivae and EOM are normal. Pupils are equal, round, and reactive to light.  Neck: Normal range of motion. Neck supple.  Cardiovascular: Regular rhythm, S1 normal and S2 normal. Exam reveals no gallop and no friction rub.  No murmur heard. Pulmonary/Chest: Effort normal and breath sounds normal. No respiratory distress. He exhibits no tenderness.  Abdominal: Soft. Normal appearance and bowel sounds are normal. There is no hepatosplenomegaly. There is no tenderness. There is no rebound, no guarding, no tenderness at McBurney's point and negative Murphy's sign. No hernia.  Musculoskeletal: Normal range of motion.  Neurological: He is alert and oriented to person, place, and time. He has normal strength. No cranial nerve deficit or sensory deficit. Coordination normal. GCS eye subscore is 4. GCS verbal subscore is 5. GCS motor subscore is 6.  Skin: Skin is warm, dry and intact. No rash noted. No cyanosis.  Psychiatric: He has a normal mood and affect. His speech is normal and behavior is normal. Thought content normal.  Nursing note and vitals reviewed.    ED Treatments / Results  Labs (all labs ordered are listed, but only abnormal results are displayed) Labs Reviewed  CBC WITH DIFFERENTIAL/PLATELET - Abnormal; Notable for the following components:      Result Value   Hemoglobin 11.9 (*)    HCT 38.1 (*)    MCV 66.7 (*)    MCH 20.8 (*)    RDW 17.5 (*)    All other components within normal limits  COMPREHENSIVE METABOLIC PANEL - Abnormal; Notable for  the following components:   BUN 43 (*)    Creatinine, Ser 10.48 (*)    Calcium 8.2 (*)    Albumin 3.3 (*)    GFR calc non Af Amer 4 (*)    GFR calc Af Amer 5 (*)    All other components within  normal limits  I-STAT TROPONIN, ED    EKG  EKG Interpretation  Date/Time:  Monday March 31 2017 01:40:02 EST Ventricular Rate:  97 PR Interval:    QRS Duration: 164 QT Interval:  422 QTC Calculation: 537 R Axis:   69 Text Interpretation:  Sinus rhythm Ventricular premature complex Consider left atrial enlargement Right bundle branch block No significant change since last tracing Confirmed by Orpah Greek [phone]) on 03/31/2017 1:46:48 AM       Radiology Dg Chest 2 View  Result Date: 03/31/2017 CLINICAL DATA:  Initial evaluation for acute weakness, cough. EXAM: CHEST  2 VIEW COMPARISON:  Prior radiograph from 12/25/2016. FINDINGS: Left-sided dual lumen hemodialysis catheter in place, stable. Mild cardiomegaly, unchanged. Mediastinal silhouette normal. Aortic atherosclerosis. Lungs mildly hypoinflated. Patchy and hazy bibasilar opacities, left greater than right, favored to reflect atelectasis, although possible infiltrate not entirely excluded, particularly at the left lung base. Probable trace left pleural effusion. Mild vascular congestion without pulmonary edema. No pneumothorax. No acute osseous abnormality. Posttraumatic deformity with sequelae of prior ORIF seen at the proximal right humerus. IMPRESSION: 1. Shallow lung inflation with streaky and patchy bibasilar opacities, left greater than right. Atelectasis is favored, although a superimposed infiltrate could be considered in the correct clinical setting, particularly at the left lung base. 2. Trace left pleural effusion. 3. Cardiomegaly with mild perihilar vascular congestion without overt pulmonary edema. 4. Aortic atherosclerosis. Electronically Signed   By: Jeannine Boga M.D.   On: 03/31/2017 02:53     Procedures Procedures (including critical care time)  Medications Ordered in ED Medications  levofloxacin (LEVAQUIN) tablet 750 mg (not administered)     Initial Impression / Assessment and Plan / ED Course  I have reviewed the triage vital signs and the nursing notes.  Pertinent labs & imaging results that were available during my care of the patient were reviewed by me and considered in my medical decision making (see chart for details).     Patient presents to the emergency department for evaluation of generalized weakness, dizziness as well as shortness of breath.  Patient is on hemodialysis.  He is due for dialysis in the morning.  He does not have any hypoxia and appears comfortable here.  All vital signs are normal.  Blood work is normal.  Chest x-ray does suggest early pneumonia.  This would likely explain patient's current symptoms.  Will initiate high-dose Levaquin (750 mg p.o. now then 500 mg every 48 hours).  Final Clinical Impressions(s) / ED Diagnoses   Final diagnoses:  Community acquired pneumonia, unspecified laterality    ED Discharge Orders        Ordered    levofloxacin (LEVAQUIN) 500 MG tablet  Every 48 hours     03/31/17 0321       Orpah Greek, MD 03/31/17 [id number]

## 2017-03-31 NOTE — ED Triage Notes (Signed)
Pt c/o feeling weak, dizzy and sob x 6 days; pt goes for dialysis later today; pt had dialysis last on Friday, 3 days ago

## 2017-04-01 ENCOUNTER — Other Ambulatory Visit (HOSPITAL_COMMUNITY): Payer: Self-pay | Admitting: Podiatry

## 2017-04-01 DIAGNOSIS — M869 Osteomyelitis, unspecified: Secondary | ICD-10-CM | POA: Diagnosis not present

## 2017-04-01 DIAGNOSIS — G629 Polyneuropathy, unspecified: Secondary | ICD-10-CM | POA: Diagnosis not present

## 2017-04-01 DIAGNOSIS — I739 Peripheral vascular disease, unspecified: Secondary | ICD-10-CM

## 2017-04-02 DIAGNOSIS — N186 End stage renal disease: Secondary | ICD-10-CM | POA: Diagnosis not present

## 2017-04-02 DIAGNOSIS — Z992 Dependence on renal dialysis: Secondary | ICD-10-CM | POA: Diagnosis not present

## 2017-04-02 DIAGNOSIS — N2581 Secondary hyperparathyroidism of renal origin: Secondary | ICD-10-CM | POA: Diagnosis not present

## 2017-04-02 DIAGNOSIS — M86171 Other acute osteomyelitis, right ankle and foot: Secondary | ICD-10-CM | POA: Diagnosis not present

## 2017-04-03 ENCOUNTER — Encounter: Payer: Self-pay | Admitting: Cardiovascular Disease

## 2017-04-03 ENCOUNTER — Ambulatory Visit (HOSPITAL_COMMUNITY)
Admission: RE | Admit: 2017-04-03 | Discharge: 2017-04-03 | Disposition: A | Discharge status: Home / Self Care | Payer: Medicare Other | Source: Ambulatory Visit | Attending: Podiatry | Admitting: Podiatry

## 2017-04-03 ENCOUNTER — Other Ambulatory Visit (HOSPITAL_COMMUNITY): Payer: Self-pay | Admitting: Podiatry

## 2017-04-03 DIAGNOSIS — I739 Peripheral vascular disease, unspecified: Secondary | ICD-10-CM | POA: Diagnosis not present

## 2017-04-03 DIAGNOSIS — R9439 Abnormal result of other cardiovascular function study: Secondary | ICD-10-CM | POA: Insufficient documentation

## 2017-04-03 DIAGNOSIS — L97909 Non-pressure chronic ulcer of unspecified part of unspecified lower leg with unspecified severity: Secondary | ICD-10-CM | POA: Diagnosis not present

## 2017-04-04 DIAGNOSIS — M869 Osteomyelitis, unspecified: Secondary | ICD-10-CM | POA: Diagnosis not present

## 2017-04-04 DIAGNOSIS — I739 Peripheral vascular disease, unspecified: Secondary | ICD-10-CM | POA: Diagnosis not present

## 2017-04-04 DIAGNOSIS — G629 Polyneuropathy, unspecified: Secondary | ICD-10-CM | POA: Diagnosis not present

## 2017-04-04 DIAGNOSIS — N2581 Secondary hyperparathyroidism of renal origin: Secondary | ICD-10-CM | POA: Diagnosis not present

## 2017-04-04 DIAGNOSIS — Z992 Dependence on renal dialysis: Secondary | ICD-10-CM | POA: Diagnosis not present

## 2017-04-04 DIAGNOSIS — M86171 Other acute osteomyelitis, right ankle and foot: Secondary | ICD-10-CM | POA: Diagnosis not present

## 2017-04-04 DIAGNOSIS — N186 End stage renal disease: Secondary | ICD-10-CM | POA: Diagnosis not present

## 2017-04-08 DIAGNOSIS — N2581 Secondary hyperparathyroidism of renal origin: Secondary | ICD-10-CM | POA: Diagnosis not present

## 2017-04-08 DIAGNOSIS — M86171 Other acute osteomyelitis, right ankle and foot: Secondary | ICD-10-CM | POA: Diagnosis not present

## 2017-04-08 DIAGNOSIS — N186 End stage renal disease: Secondary | ICD-10-CM | POA: Diagnosis not present

## 2017-04-08 DIAGNOSIS — Z992 Dependence on renal dialysis: Secondary | ICD-10-CM | POA: Diagnosis not present

## 2017-04-09 ENCOUNTER — Encounter: Payer: Self-pay | Admitting: Cardiology

## 2017-04-09 DIAGNOSIS — N2581 Secondary hyperparathyroidism of renal origin: Secondary | ICD-10-CM | POA: Diagnosis not present

## 2017-04-09 DIAGNOSIS — M86171 Other acute osteomyelitis, right ankle and foot: Secondary | ICD-10-CM | POA: Diagnosis not present

## 2017-04-09 DIAGNOSIS — Z992 Dependence on renal dialysis: Secondary | ICD-10-CM | POA: Diagnosis not present

## 2017-04-09 DIAGNOSIS — N186 End stage renal disease: Secondary | ICD-10-CM | POA: Diagnosis not present

## 2017-04-09 NOTE — Progress Notes (Signed)
Cardiology Office Note  Date: 04/10/2017   ID: Joseph [place] Sr., DOB [birth year], MRN [id number]  PCP: Joseph Fire, MD  Consulting Cardiologist: Joseph Lesches, MD   Chief Complaint  Patient presents with  . Dyspnea on exertion    History of Present Illness: [name]. is a 76 y.o. male patient of Dr. Marlou [name], last seen in April 2017 based on chart review.  He is referred to the office by Dr. Lowanda [name] for the evaluation of dyspnea on exertion.  I reviewed extensive records and updated his chart.  He states that over the last year he has had progressive dyspnea on exertion, particularly when walking up an incline.  He describes NYHA class III symptoms.  He has been using a cane to ambulate more recently.  He does not report chest tightness, no palpitations or syncope.  Also denies orthopnea and leg swelling.  Cardiac history includes previously documented nonischemic cardiomyopathy with normalization of LVEF as of February 2017.  He also had moderate to severe aortic stenosis as of February 2017.  Cardiac catheterization last year revealed nonobstructive coronary atherosclerosis.  He has a long-standing history of end-stage renal disease on hemodialysis.  He reports compliance with sessions.  I reviewed his medications which are outlined below.  He reports compliance.   Past Medical History:  Diagnosis Date  . Anxiety   . Aortic stenosis   . Arthritis   . CVA (cerebral infarction)   . ESRD (end stage renal disease) on dialysis [facility])    M/W/F at [facility] in [place]  . Essential hypertension   . Gastric ulcer 2004  . History of cardiomyopathy    LVEF normal as of February 2017  . History of stroke    Left side weakness  . Iron deficiency anemia     Past Surgical History:  Procedure Laterality Date  . ABDOMINAL AORTAGRAM N/A 01/24/2012   Procedure: ABDOMINAL Maxcine Ham;  Surgeon: Elam Dutch, MD;  Location: [facility] CATH LAB;  Service: Cardiovascular;  Laterality: N/A;    . ARTERIOVENOUS GRAFT PLACEMENT Right right arm  . AV FISTULA PLACEMENT Left 08/31/2015   Procedure: ARTERIOVENOUS (AV) FISTULA CREATION- LEFT RADIOCEPHALIC;  Surgeon: Mal Misty, MD;  Location: [place];  Service: Vascular;  Laterality: Left;  . AV FISTULA PLACEMENT Right 02/18/2017   Procedure: INSERTION OF ARTERIOVENOUS (AV) GORE-TEX GRAFT  RIGHT UPPER ARM;  Surgeon: Conrad [place], MD;  Location: [place];  Service: Vascular;  Laterality: Right;  . BASCILIC VEIN TRANSPOSITION Left 12/19/2015   Procedure: FIRST STAGE BRACHIAL VEIN TRANSPOSITION;  Surgeon: Conrad [place], MD;  Location: [place];  Service: Vascular;  Laterality: Left;  . BASCILIC VEIN TRANSPOSITION Left 02/08/2016   Procedure: SECOND STAGE BRACHIAL VEIN TRANSPOSITION;  Surgeon: Conrad , MD;  Location: [place];  Service: Vascular;  Laterality: Left;  . CARDIAC CATHETERIZATION N/A 07/11/2015   Procedure: Left Heart Cath and Coronary Angiography;  Surgeon: Troy Sine, MD;  Location: [place] CV LAB;  Service: Cardiovascular;  Laterality: N/A;  . Carpel Tunnel Left Dec. 22, 2016  . CHOLECYSTECTOMY    . COLONOSCOPY  2004   Dr. Irving Shows, left sided diverticula and cecal polyp, path unknown  . COLONOSCOPY  10/29/2011   Procedure: COLONOSCOPY;  Surgeon: Daneil Dolin, MD;  [facility];  Service: Endoscopy;  Laterality: N/A;  10:15  . ESOPHAGOGASTRODUODENOSCOPY  11/2002   Dr. Gala Romney, erosive reflux esophagitis, multiple gastric ulcer and antral/bulbar erosions. Serologies positive  for H.Pylori and was treated  . ESOPHAGOGASTRODUODENOSCOPY  11/20014   Dr. Gala Romney, small hh only, ulcers healed  . ESOPHAGOGASTRODUODENOSCOPY  09/21/2011   Dr Trevor Iha HH, antral erosions, ?early GAVE  . FISTULOGRAM Left 12/10/2016   Procedure: THROMBECTOMY OF LEFT ARM ARTERIOVENOUS FISTULA;  Surgeon: Waynetta Sandy, MD;  Location: [place];  Service: Vascular;  Laterality: Left;  . INSERTION OF DIALYSIS CATHETER Left 12/10/2016    Procedure: INSERTION OF TUNNELED DIALYSIS CATHETER;  Surgeon: Waynetta Sandy, MD;  Location: [place];  Service: Vascular;  Laterality: Left;  . IR GENERIC HISTORICAL  07/16/2016   IR REMOVAL TUN CV CATH W/O FL 07/16/2016 Saverio Danker, PA-C [facility]-INTERV RAD  . LIGATION OF ARTERIOVENOUS  FISTULA Left 12/19/2015   Procedure: LIGATION OF RADIOCEPHALIC ARTERIOVENOUS  FISTULA;  Surgeon: Conrad Millington, MD;  Location: [place];  Service: Vascular;  Laterality: Left;  Marland Kitchen MASS EXCISION Right 02/18/2017   Procedure: EXCISION OF RIGHT AXILLARY EPIDERMAL INCLUSION CYST;  Surgeon: Conrad Allen, MD;  Location: [place];  Service: Vascular;  Laterality: Right;  . PERIPHERAL VASCULAR CATHETERIZATION N/A 12/14/2015   Procedure: Fistulagram;  Surgeon: Conrad [place], MD;  Location: [place] CV LAB;  Service: Cardiovascular;  Laterality: N/A;  . SHOULDER SURGERY    . UPPER EXTREMITY VENOGRAPHY Bilateral 12/17/2016   Procedure: Bilateral Upper Extremity Venography;  Surgeon: Serafina Mitchell, MD;  Location: [place] CV LAB;  Service: Cardiovascular;  Laterality: Bilateral;    Current Outpatient Medications  Medication Sig Dispense Refill  . ALPRAZolam (XANAX) 0.5 MG tablet Take 0.5 mg by mouth daily.  3  . atorvastatin (LIPITOR) 40 MG tablet Take 40 mg by mouth daily.  3  . b complex-vitamin c-folic acid (NEPHRO-VITE) 0.8 MG TABS Take 0.8 mg by mouth See admin instructions. Takes on Tuesdays, Thursdays, Saturdays, and Sundays. Does not take on Mondays, Wednesdays, and Fridays due to Dialysis treatments.    . cinacalcet (SENSIPAR) 60 MG tablet Take 60 mg by mouth every evening. With evening meal    . levofloxacin (LEVAQUIN) 500 MG tablet Take 1 tablet (500 mg total) by mouth every other day. Take first dose on December 5, take after dialysis 5 tablet 0  . loratadine (CLARITIN) 10 MG tablet Take 1 tablet (10 mg total) by mouth every other day as needed for allergies. 30 tablet 0  . meclizine (ANTIVERT) 25 MG tablet  Take 25 mg by mouth daily as needed for dizziness (Non dialysis days).     . ondansetron (ZOFRAN) 8 MG tablet Take 1 tablet (8 mg total) by mouth every 8 (eight) hours as needed for nausea or vomiting. 20 tablet 0  . promethazine (PHENERGAN) 25 MG tablet Take 25 mg by mouth every 8 (eight) hours as needed for nausea or vomiting.     . sevelamer (RENVELA) 800 MG tablet Take 16 mg by mouth See admin instructions. Take 2400  mg by mouth 3 times daily with full meals and take 1600 mg by mouth with snacks.     No current facility-administered medications for this visit.    Allergies:  Aspirin   Social History: The patient  reports that he quit smoking about 12 years ago. He quit smokeless tobacco use about 30 years ago. His smokeless tobacco use included chew. He reports that he does not drink alcohol or use drugs.   Family History: The patient's family history includes Hypertension in his mother.   ROS:  Please see the history of present illness.  Otherwise, complete review of systems is positive for chronic left-sided weakness after stroke.  All other systems are reviewed and negative.   Physical Exam: VS:  BP 140/80   Pulse 90   Ht 5\' 5"  (1.651 m)   Wt 178 lb (80.7 kg)   SpO2 96%   BMI 29.62 kg/m , BMI Body mass index is 29.62 kg/m.  Wt Readings from Last 3 Encounters:  04/10/17 178 lb (80.7 kg)  03/31/17 170 lb (77.1 kg)  03/24/17 170 lb (77.1 kg)    General: Chronically ill-appearing male, no distress. HEENT: Conjunctiva and lids normal, oropharynx clear with poor dentition. Neck: Supple, no elevated JVP or carotid bruits, no thyromegaly. Lungs: Clear to auscultation, nonlabored breathing at rest. Cardiac: Regular rate and rhythm, no S3, 3/6 systolic murmur, no pericardial rub. Abdomen: Soft, nontender, bowel sounds present, no guarding or rebound. Extremities: No pitting edema, distal pulses diminished.  Toes with evidence of onychomycosis and also ischemic PAD changes. Skin:  Warm and dry. Musculoskeletal: No kyphosis. Neuropsychiatric: Alert and oriented x3, affect grossly appropriate.  ECG: I personally reviewed the tracing from 03/31/2017 which showed sinus rhythm with right bundle branch block and PVC.  Recent Labwork: 03/31/2017: ALT 20; AST 24; BUN 43; Creatinine, Ser 10.48; Hemoglobin 11.9; Platelets 223; Potassium 3.7; Sodium 137   Other Studies Reviewed Today:  Echocardiogram 06/27/2015: Study Conclusions   - Left ventricle: The cavity size was mildly dilated. Wall   thickness was increased in a pattern of moderate LVH. There was   mild focal basal hypertrophy of the septum. Systolic function was   normal. The estimated ejection fraction was in the range of 60%   to 65%. Wall motion was normal; there were no regional wall   motion abnormalities. Doppler parameters are consistent with   abnormal left ventricular relaxation (grade 1 diastolic   dysfunction). - Aortic valve: There was moderate stenosis. There was trivial   regurgitation. Valve area (VTI): 0.89 cm^2. Valve area (Vmax):   1.02 cm^2. Valve area (Vmean): 0.91 cm^2. - Mitral valve: Calcified annulus. Mildly thickened leaflets .   There was mild regurgitation. - Left atrium: The atrium was moderately dilated.  Cardiac catheterization 07/11/2015:  Prox LAD lesion, 45% stenosed.  There is moderate to severe left ventricular systolic dysfunction.   Nonischemic cardiomyopathy with global hypokinesis with more pronounced inferior hypocontractility and overall ejection fraction at 35%.  Mild coronary calcification with 40-50% eccentric proximal LAD stenosis just prior to a septal perforating artery; mild calcification of the proximal left circumflex coronary artery; and large dominant RCA without significant obstructive stenoses.  Probably at least mild aortic stenosis with LV to AO gradient ranging from 10 -18 mm Hg in this patient with reduced LV function; LVEDP 13 mm Hg.   Assessment  and Plan:  1.  Progressive dyspnea on exertion over the last year.  Patient has a history of nonischemic cardiomyopathy although normalization of LVEF as of echocardiogram February 2017.  Also history of moderate to severe aortic stenosis at that time as well which may be the issue if this has further progressed.  Plan is to obtain a follow-up echocardiogram.  2.  Nonobstructive CAD by cardiac catheterization in March 2017.  Currently on statin therapy.  Not on aspirin with previous history of bleeding.  3.  End-stage renal disease on hemodialysis.  4.  Essential hypertension.  5.  Mixed hyperlipidemia, on Lipitor.  Current medicines were reviewed with the patient today.   Orders Placed This Encounter  Procedures  . ECHOCARDIOGRAM COMPLETE    Disposition: Call with test results and determine next step.  Signed, Satira Sark, MD, [facility] 04/10/2017 10:00 AM    [facility] [address], [place], [place] [address] Phone: [phone]; Fax: [phone]

## 2017-04-10 ENCOUNTER — Encounter: Payer: Self-pay | Admitting: Cardiology

## 2017-04-10 ENCOUNTER — Ambulatory Visit (INDEPENDENT_AMBULATORY_CARE_PROVIDER_SITE_OTHER): Payer: Medicare Other | Admitting: Cardiology

## 2017-04-10 VITALS — BP 140/80 | HR 90 | Ht 65.0 in | Wt 178.0 lb

## 2017-04-10 DIAGNOSIS — R0609 Other forms of dyspnea: Secondary | ICD-10-CM

## 2017-04-10 DIAGNOSIS — Z992 Dependence on renal dialysis: Secondary | ICD-10-CM | POA: Diagnosis not present

## 2017-04-10 DIAGNOSIS — N186 End stage renal disease: Secondary | ICD-10-CM

## 2017-04-10 DIAGNOSIS — I350 Nonrheumatic aortic (valve) stenosis: Secondary | ICD-10-CM | POA: Diagnosis not present

## 2017-04-10 DIAGNOSIS — I10 Essential (primary) hypertension: Secondary | ICD-10-CM | POA: Diagnosis not present

## 2017-04-10 DIAGNOSIS — E782 Mixed hyperlipidemia: Secondary | ICD-10-CM | POA: Diagnosis not present

## 2017-04-10 DIAGNOSIS — I2510 Atherosclerotic heart disease of native coronary artery without angina pectoris: Secondary | ICD-10-CM | POA: Diagnosis not present

## 2017-04-10 NOTE — Patient Instructions (Signed)
Your physician recommends that you schedule a follow-up appointment in: We will call you with results.     Your physician has requested that you have an echocardiogram. Echocardiography is a painless test that uses sound waves to create images of your heart. It provides your doctor with information about the size and shape of your heart and how well your heart's chambers and valves are working. This procedure takes approximately one hour. There are no restrictions for this procedure.     Your physician recommends that you continue on your current medications as directed. Please refer to the Current Medication list given to you today.      No lab work ordered today.      Thank you for choosing [facility] !

## 2017-04-11 ENCOUNTER — Ambulatory Visit (INDEPENDENT_AMBULATORY_CARE_PROVIDER_SITE_OTHER): Payer: Medicare Other | Admitting: Family

## 2017-04-11 ENCOUNTER — Encounter: Payer: Self-pay | Admitting: Family

## 2017-04-11 ENCOUNTER — Encounter: Payer: Self-pay | Admitting: *Deleted

## 2017-04-11 ENCOUNTER — Other Ambulatory Visit: Payer: Self-pay | Admitting: *Deleted

## 2017-04-11 ENCOUNTER — Encounter: Payer: Medicare Other | Admitting: Vascular Surgery

## 2017-04-11 VITALS — BP 153/78 | HR 113 | Temp 97.2°F | Resp 18 | Wt 178.4 lb

## 2017-04-11 DIAGNOSIS — I770 Arteriovenous fistula, acquired: Secondary | ICD-10-CM

## 2017-04-11 DIAGNOSIS — I779 Disorder of arteries and arterioles, unspecified: Secondary | ICD-10-CM

## 2017-04-11 DIAGNOSIS — I96 Gangrene, not elsewhere classified: Secondary | ICD-10-CM | POA: Diagnosis not present

## 2017-04-11 DIAGNOSIS — N186 End stage renal disease: Secondary | ICD-10-CM

## 2017-04-11 DIAGNOSIS — N2581 Secondary hyperparathyroidism of renal origin: Secondary | ICD-10-CM | POA: Diagnosis not present

## 2017-04-11 DIAGNOSIS — M86171 Other acute osteomyelitis, right ankle and foot: Secondary | ICD-10-CM | POA: Diagnosis not present

## 2017-04-11 DIAGNOSIS — Z992 Dependence on renal dialysis: Secondary | ICD-10-CM | POA: Diagnosis not present

## 2017-04-11 NOTE — Patient Instructions (Signed)

## 2017-04-11 NOTE — H&P (View-Only) (Signed)
[facility] [place]   CC: Follow up peripheral artery occlusive disease  History of Present Illness [name]. is a 76 y.o. male who is s/p Right upper arm arteriovenous graft and Excision of right axillary epidermal inclusion cyst on 02-18-17 by Dr. Bridgett Larsson. Pt is scheduled to see Dr. Bridgett Larsson for follow up of the above surgery on 05-02-17.   Pt was last evaluated for PAD on 08-26-13 by Dr. Oneida Alar. At that time pt stated that the ulcers on his right foot had completely healed. 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 Nils Pyle. He denied any claudication symptoms. He is also on chronic hemodialysis. His right arm fistula was working well. He had a callus over the first metatarsal head on the right foot. He underwent atherectomy and angioplasty of his right anterior tibial artery in 2012. Arteriogram in 2013 showed patent in line one-vessel runoff via anterior tibial artery bilaterally. Lower extremities: Right foot thickened callus over first metatarsal head 3 x 3 cm diameter, left first metatarsal head ulcer 2 cm in diameter 3 mm depth with some granulation tissue at the base no palpable pedal pulses Data: The patient had bilateral lower extremity duplex exam which was consistent with his recent arteriogram which showed in-line flow via the anterior tibial artery bilaterally. The anterior tibial artery and all vessels above this were continuing to be patent. There was some stenosis of the mid right superficial femoral artery. Assessment: Neuropathic ulcer left foot was being followed by the wound center has in line arterial flow Plan: Followup when necessary if continued deterioration or nonhealing of the left foot and would consider arteriogram at that time.  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 Pt states he has been receiving antibx in HD for the last 4 days.  He dialyzes M-W-F at [facility] via left upper chest catheter.   Pt reports about a month ago his left great toe started having blisters and ulcers, no pain associated with this, no fever or chills.     Diabetic: Yes, prior to HD Tobacco use: former smoker, quit in 2006  Pt meds include: Statin :Yes Betablocker: No ASA: states he cannot take due to hx of GI bleed Other anticoagulants/antiplatelets: no, states hx of GI bleed with blood transfusions  Past Medical History:  Diagnosis Date  . Anxiety   . Aortic stenosis   . Arthritis   . CVA (cerebral infarction)   . ESRD (end stage renal disease) on dialysis [facility])    M/W/F at [facility] in [place]  . Essential hypertension   . Gastric ulcer 2004  . History of cardiomyopathy    LVEF normal as of February 2017  . History of stroke    Left side weakness  . Iron deficiency anemia     Social History Social History   Tobacco Use  . Smoking status: Former Smoker    Last attempt to quit: 04/29/2004    Years since quitting: 12.9  . Smokeless tobacco: Former Systems developer    Types: Chew    Quit date: 01/16/1987  . Tobacco comment: quit 2006  Substance Use Topics  . Alcohol use: No  . Drug use: No    Family History Family History  Problem Relation Age  of Onset  . Hypertension Mother   . Colon cancer Neg Hx   . Liver disease Neg Hx     Past Surgical History:  Procedure Laterality Date  . ABDOMINAL AORTAGRAM N/A 01/24/2012   Procedure: ABDOMINAL Maxcine Ham;  Surgeon: Elam Dutch, MD;  Location: [facility] CATH LAB;  Service: Cardiovascular;  Laterality: N/A;  . ARTERIOVENOUS GRAFT PLACEMENT Right right arm  . AV FISTULA PLACEMENT Left 08/31/2015   Procedure: ARTERIOVENOUS (AV) FISTULA CREATION- LEFT RADIOCEPHALIC;  Surgeon: Mal Misty, MD;  Location: [place];  Service: Vascular;  Laterality: Left;  . AV FISTULA  PLACEMENT Right 02/18/2017   Procedure: INSERTION OF ARTERIOVENOUS (AV) GORE-TEX GRAFT  RIGHT UPPER ARM;  Surgeon: Conrad Austin, MD;  Location: [place];  Service: Vascular;  Laterality: Right;  . BASCILIC VEIN TRANSPOSITION Left 12/19/2015   Procedure: FIRST STAGE BRACHIAL VEIN TRANSPOSITION;  Surgeon: Conrad La Puente, MD;  Location: [place];  Service: Vascular;  Laterality: Left;  . BASCILIC VEIN TRANSPOSITION Left 02/08/2016   Procedure: SECOND STAGE BRACHIAL VEIN TRANSPOSITION;  Surgeon: Conrad [place], MD;  Location: [place];  Service: Vascular;  Laterality: Left;  . CARDIAC CATHETERIZATION N/A 07/11/2015   Procedure: Left Heart Cath and Coronary Angiography;  Surgeon: Troy Sine, MD;  Location: [place] CV LAB;  Service: Cardiovascular;  Laterality: N/A;  . Carpel Tunnel Left Dec. 22, 2016  . CHOLECYSTECTOMY    . COLONOSCOPY  2004   Dr. Irving Shows, left sided diverticula and cecal polyp, path unknown  . COLONOSCOPY  10/29/2011   Procedure: COLONOSCOPY;  Surgeon: Daneil Dolin, MD;  [facility];  Service: Endoscopy;  Laterality: N/A;  10:15  . ESOPHAGOGASTRODUODENOSCOPY  11/2002   Dr. Gala Romney, erosive reflux esophagitis, multiple gastric ulcer and antral/bulbar erosions. Serologies positive for H.Pylori and was treated  . ESOPHAGOGASTRODUODENOSCOPY  11/20014   Dr. Gala Romney, small hh only, ulcers healed  . ESOPHAGOGASTRODUODENOSCOPY  09/21/2011   Dr Trevor Iha [facility], antral erosions, ?early GAVE  . FISTULOGRAM Left 12/10/2016   Procedure: THROMBECTOMY OF LEFT ARM ARTERIOVENOUS FISTULA;  Surgeon: Waynetta Sandy, MD;  Location: [place];  Service: Vascular;  Laterality: Left;  . INSERTION OF DIALYSIS CATHETER Left 12/10/2016   Procedure: INSERTION OF TUNNELED DIALYSIS CATHETER;  Surgeon: Waynetta Sandy, MD;  Location: [place];  Service: Vascular;  Laterality: Left;  . IR GENERIC HISTORICAL  07/16/2016   IR REMOVAL TUN CV CATH W/O FL 07/16/2016 Saverio Danker, PA-C [facility]-INTERV RAD    . LIGATION OF ARTERIOVENOUS  FISTULA Left 12/19/2015   Procedure: LIGATION OF RADIOCEPHALIC ARTERIOVENOUS  FISTULA;  Surgeon: Conrad Iola, MD;  Location: [place];  Service: Vascular;  Laterality: Left;  Marland Kitchen MASS EXCISION Right 02/18/2017   Procedure: EXCISION OF RIGHT AXILLARY EPIDERMAL INCLUSION CYST;  Surgeon: Conrad [place], MD;  Location: [place];  Service: Vascular;  Laterality: Right;  . PERIPHERAL VASCULAR CATHETERIZATION N/A 12/14/2015   Procedure: Fistulagram;  Surgeon: Conrad [place] Park, MD;  Location: [place] CV LAB;  Service: Cardiovascular;  Laterality: N/A;  . SHOULDER SURGERY    . UPPER EXTREMITY VENOGRAPHY Bilateral 12/17/2016   Procedure: Bilateral Upper Extremity Venography;  Surgeon: Serafina Mitchell, MD;  Location: [place] CV LAB;  Service: Cardiovascular;  Laterality: Bilateral;    Allergies  Allergen Reactions  . Aspirin Other (See Comments)    INTERNAL BLEEDING    Current Outpatient Medications  Medication Sig Dispense Refill  . ALPRAZolam (XANAX) 0.5 MG tablet Take 0.5  mg by mouth daily.  3  . atorvastatin (LIPITOR) 40 MG tablet Take 40 mg by mouth daily.  3  . b complex-vitamin c-folic acid (NEPHRO-VITE) 0.8 MG TABS Take 0.8 mg by mouth See admin instructions. Takes on Tuesdays, Thursdays, Saturdays, and Sundays. Does not take on Mondays, Wednesdays, and Fridays due to Dialysis treatments.    . cinacalcet (SENSIPAR) 60 MG tablet Take 60 mg by mouth every evening. With evening meal    . levofloxacin (LEVAQUIN) 500 MG tablet Take 1 tablet (500 mg total) by mouth every other day. Take first dose on December 5, take after dialysis 5 tablet 0  . loratadine (CLARITIN) 10 MG tablet Take 1 tablet (10 mg total) by mouth every other day as needed for allergies. 30 tablet 0  . meclizine (ANTIVERT) 25 MG tablet Take 25 mg by mouth daily as needed for dizziness (Non dialysis days).     . ondansetron (ZOFRAN) 8 MG tablet Take 1 tablet (8 mg total) by mouth every 8 (eight) hours as  needed for nausea or vomiting. 20 tablet 0  . promethazine (PHENERGAN) 25 MG tablet Take 25 mg by mouth every 8 (eight) hours as needed for nausea or vomiting.     . sevelamer (RENVELA) 800 MG tablet Take 16 mg by mouth See admin instructions. Take 2400  mg by mouth 3 times daily with full meals and take 1600 mg by mouth with snacks.     No current facility-administered medications for this visit.     ROS: See HPI for pertinent positives and negatives.   Physical Examination  Vitals:   04/11/17 1325  BP: (!) 153/78  Pulse: (!) 113  Resp: 18  Temp: (!) 97.2 F (36.2 C)  TempSrc: Oral  SpO2: 97%  Weight: 178 lb 6.4 oz (80.9 kg)   Body mass index is 29.69 kg/m.  General: A&O x 3, WDWN, male. Gait: normal Eyes: PERRLA. Pulmonary: Respirations are non labored, CTAB, good air movement Cardiac: irregular Rhythm, no detected murmur.         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 Right 5th toe is surgically absent.                                                                                                           LE Pulses Right Left       FEMORAL  2+ palpable  2+ palpable        POPLITEAL  not palpable   not palpable       POSTERIOR TIBIAL  not palpable, no Doppler signal   not palpable, no Doppler signal        DORSALIS PEDIS      ANTERIOR TIBIAL not palpable, + Doppler signal  not palpable, + Doppler signal        PERONEAL non-Dopplerable    non-Dopplerable    Abdomen: soft, NT, no palapbe masses. Skin: no rashes, see Extremities.  Musculoskeletal: no muscle wasting or atrophy.  Neurologic:  A&O X 3; Appropriate Affect ; SENSATION: diminished in feet; MOTOR FUNCTION:  moving all extremities equally, motor strength 5/5 throughout. Speech is fluent/normal. CN 2-12 intact.    ASSESSMENT: [name]. is a 76 y.o. male who is s/p Right upper arm arteriovenous graft and Excision of right axillary epidermal inclusion cyst on 02-18-17 by Dr. Bridgett Larsson. Pt is scheduled to see Dr. Bridgett Larsson for follow up of the above surgery on 05-02-17.   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  He was last evaluated for PAD in April 2015.  There are + Doppler signals in bilateral DP arteries, no peroneal or PT signals bilaterally. No signs of ischemia on left foot or leg.   I discussed with Dr. Bridgett Larsson pt HPI and exam results, see Plan.   DATA  No test results for today, see Doppler exam results above.     PLAN:  Based on the patient's vascular studies and examination, pt will be scheduled for aortogram with bilateral run off, possible intervention on a Tuesday or a Thursday (non HD day) next week.    I discussed in depth with the patient the nature of atherosclerosis, and emphasized the importance of maximal medical management including strict control of blood pressure, blood glucose, and lipid levels, obtaining regular exercise, and continued cessation of smoking.  The patient is aware that without maximal medical management the underlying atherosclerotic disease process will progress, limiting the benefit of any interventions.  The patient was given information about PAD including signs, symptoms, treatment, what symptoms should prompt the patient to seek immediate medical care, and risk reduction measures to take.  Clemon Chambers, RN, MSN, FNP-C [facility] Phone: [phone]  Clinic MD: Chen/Cain  04/11/17 1:35 PM

## 2017-04-11 NOTE — Progress Notes (Signed)
[facility] OF [place]   CC: Follow up peripheral artery occlusive disease  History of Present Illness [name]. is a 76 y.o. male who is s/p Right upper arm arteriovenous graft and Excision of right axillary epidermal inclusion cyst on 02-18-17 by Dr. Bridgett Larsson. Pt is scheduled to see Dr. Bridgett Larsson for follow up of the above surgery on 05-02-17.   Pt was last evaluated for PAD on 08-26-13 by Dr. Oneida Alar. At that time pt stated that the ulcers on his right foot had completely healed. 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 Nils Pyle. He denied any claudication symptoms. He is also on chronic hemodialysis. His right arm fistula was working well. He had a callus over the first metatarsal head on the right foot. He underwent atherectomy and angioplasty of his right anterior tibial artery in 2012. Arteriogram in 2013 showed patent in line one-vessel runoff via anterior tibial artery bilaterally. Lower extremities: Right foot thickened callus over first metatarsal head 3 x 3 cm diameter, left first metatarsal head ulcer 2 cm in diameter 3 mm depth with some granulation tissue at the base no palpable pedal pulses Data: The patient had bilateral lower extremity duplex exam which was consistent with his recent arteriogram which showed in-line flow via the anterior tibial artery bilaterally. The anterior tibial artery and all vessels above this were continuing to be patent. There was some stenosis of the mid right superficial femoral artery. Assessment: Neuropathic ulcer left foot was being followed by the wound center has in line arterial flow Plan: Followup when necessary if continued deterioration or nonhealing of the left foot and would consider arteriogram at that time.  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 Pt states he has been receiving antibx in HD for the last 4 days.  He dialyzes M-W-F at [facility] via left upper chest catheter.   Pt reports about a month ago his left great toe started having blisters and ulcers, no pain associated with this, no fever or chills.     Diabetic: Yes, prior to HD Tobacco use: former smoker, quit in 2006  Pt meds include: Statin :Yes Betablocker: No ASA: states he cannot take due to hx of GI bleed Other anticoagulants/antiplatelets: no, states hx of GI bleed with blood transfusions  Past Medical History:  Diagnosis Date  . Anxiety   . Aortic stenosis   . Arthritis   . CVA (cerebral infarction)   . ESRD (end stage renal disease) on dialysis [facility])    M/W/F at [facility] in [place]  . Essential hypertension   . Gastric ulcer 2004  . History of cardiomyopathy    LVEF normal as of February 2017  . History of stroke    Left side weakness  . Iron deficiency anemia     Social History Social History   Tobacco Use  . Smoking status: Former Smoker    Last attempt to quit: 04/29/2004    Years since quitting: 12.9  . Smokeless tobacco: Former Systems developer    Types: Chew    Quit date: 01/16/1987  . Tobacco comment: quit 2006  Substance Use Topics  . Alcohol use: No  . Drug use: No    Family History Family History  Problem Relation Age  of Onset  . Hypertension Mother   . Colon cancer Neg Hx   . Liver disease Neg Hx     Past Surgical History:  Procedure Laterality Date  . ABDOMINAL AORTAGRAM N/A 01/24/2012   Procedure: ABDOMINAL Maxcine Ham;  Surgeon: Elam Dutch, MD;  Location: [facility] CATH LAB;  Service: Cardiovascular;  Laterality: N/A;  . ARTERIOVENOUS GRAFT PLACEMENT Right right arm  . AV FISTULA PLACEMENT Left 08/31/2015   Procedure: ARTERIOVENOUS (AV) FISTULA CREATION- LEFT RADIOCEPHALIC;  Surgeon: Mal Misty, MD;  Location: [place];  Service: Vascular;  Laterality: Left;  . AV FISTULA  PLACEMENT Right 02/18/2017   Procedure: INSERTION OF ARTERIOVENOUS (AV) GORE-TEX GRAFT  RIGHT UPPER ARM;  Surgeon: Conrad Elk Ridge, MD;  Location: [place];  Service: Vascular;  Laterality: Right;  . BASCILIC VEIN TRANSPOSITION Left 12/19/2015   Procedure: FIRST STAGE BRACHIAL VEIN TRANSPOSITION;  Surgeon: Conrad Beulaville, MD;  Location: [place];  Service: Vascular;  Laterality: Left;  . BASCILIC VEIN TRANSPOSITION Left 02/08/2016   Procedure: SECOND STAGE BRACHIAL VEIN TRANSPOSITION;  Surgeon: Conrad [place], MD;  Location: [place];  Service: Vascular;  Laterality: Left;  . CARDIAC CATHETERIZATION N/A 07/11/2015   Procedure: Left Heart Cath and Coronary Angiography;  Surgeon: Troy Sine, MD;  Location: [place] CV LAB;  Service: Cardiovascular;  Laterality: N/A;  . Carpel Tunnel Left Dec. 22, 2016  . CHOLECYSTECTOMY    . COLONOSCOPY  2004   Dr. Irving Shows, left sided diverticula and cecal polyp, path unknown  . COLONOSCOPY  10/29/2011   Procedure: COLONOSCOPY;  Surgeon: Daneil Dolin, MD;  [facility];  Service: Endoscopy;  Laterality: N/A;  10:15  . ESOPHAGOGASTRODUODENOSCOPY  11/2002   Dr. Gala Romney, erosive reflux esophagitis, multiple gastric ulcer and antral/bulbar erosions. Serologies positive for H.Pylori and was treated  . ESOPHAGOGASTRODUODENOSCOPY  11/20014   Dr. Gala Romney, small hh only, ulcers healed  . ESOPHAGOGASTRODUODENOSCOPY  09/21/2011   Dr Trevor Iha [facility], antral erosions, ?early GAVE  . FISTULOGRAM Left 12/10/2016   Procedure: THROMBECTOMY OF LEFT ARM ARTERIOVENOUS FISTULA;  Surgeon: Waynetta Sandy, MD;  Location: [place];  Service: Vascular;  Laterality: Left;  . INSERTION OF DIALYSIS CATHETER Left 12/10/2016   Procedure: INSERTION OF TUNNELED DIALYSIS CATHETER;  Surgeon: Waynetta Sandy, MD;  Location: [place];  Service: Vascular;  Laterality: Left;  . IR GENERIC HISTORICAL  07/16/2016   IR REMOVAL TUN CV CATH W/O FL 07/16/2016 Saverio Danker, PA-C [facility]-INTERV RAD    . LIGATION OF ARTERIOVENOUS  FISTULA Left 12/19/2015   Procedure: LIGATION OF RADIOCEPHALIC ARTERIOVENOUS  FISTULA;  Surgeon: Conrad Roxie, MD;  Location: [place];  Service: Vascular;  Laterality: Left;  Marland Kitchen MASS EXCISION Right 02/18/2017   Procedure: EXCISION OF RIGHT AXILLARY EPIDERMAL INCLUSION CYST;  Surgeon: Conrad Beltrami, MD;  Location: [place];  Service: Vascular;  Laterality: Right;  . PERIPHERAL VASCULAR CATHETERIZATION N/A 12/14/2015   Procedure: Fistulagram;  Surgeon: Conrad , MD;  Location: [place] CV LAB;  Service: Cardiovascular;  Laterality: N/A;  . SHOULDER SURGERY    . UPPER EXTREMITY VENOGRAPHY Bilateral 12/17/2016   Procedure: Bilateral Upper Extremity Venography;  Surgeon: Serafina Mitchell, MD;  Location: [place] CV LAB;  Service: Cardiovascular;  Laterality: Bilateral;    Allergies  Allergen Reactions  . Aspirin Other (See Comments)    INTERNAL BLEEDING    Current Outpatient Medications  Medication Sig Dispense Refill  . ALPRAZolam (XANAX) 0.5 MG tablet Take 0.5  mg by mouth daily.  3  . atorvastatin (LIPITOR) 40 MG tablet Take 40 mg by mouth daily.  3  . b complex-vitamin c-folic acid (NEPHRO-VITE) 0.8 MG TABS Take 0.8 mg by mouth See admin instructions. Takes on Tuesdays, Thursdays, Saturdays, and Sundays. Does not take on Mondays, Wednesdays, and Fridays due to Dialysis treatments.    . cinacalcet (SENSIPAR) 60 MG tablet Take 60 mg by mouth every evening. With evening meal    . levofloxacin (LEVAQUIN) 500 MG tablet Take 1 tablet (500 mg total) by mouth every other day. Take first dose on December 5, take after dialysis 5 tablet 0  . loratadine (CLARITIN) 10 MG tablet Take 1 tablet (10 mg total) by mouth every other day as needed for allergies. 30 tablet 0  . meclizine (ANTIVERT) 25 MG tablet Take 25 mg by mouth daily as needed for dizziness (Non dialysis days).     . ondansetron (ZOFRAN) 8 MG tablet Take 1 tablet (8 mg total) by mouth every 8 (eight) hours as  needed for nausea or vomiting. 20 tablet 0  . promethazine (PHENERGAN) 25 MG tablet Take 25 mg by mouth every 8 (eight) hours as needed for nausea or vomiting.     . sevelamer (RENVELA) 800 MG tablet Take 16 mg by mouth See admin instructions. Take 2400  mg by mouth 3 times daily with full meals and take 1600 mg by mouth with snacks.     No current facility-administered medications for this visit.     ROS: See HPI for pertinent positives and negatives.   Physical Examination  Vitals:   04/11/17 1325  BP: (!) 153/78  Pulse: (!) 113  Resp: 18  Temp: (!) 97.2 F (36.2 C)  TempSrc: Oral  SpO2: 97%  Weight: 178 lb 6.4 oz (80.9 kg)   Body mass index is 29.69 kg/m.  General: A&O x 3, WDWN, male. Gait: normal Eyes: PERRLA. Pulmonary: Respirations are non labored, CTAB, good air movement Cardiac: irregular Rhythm, no detected murmur.         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 Right 5th toe is surgically absent.                                                                                                           LE Pulses Right Left       FEMORAL  2+ palpable  2+ palpable        POPLITEAL  not palpable   not palpable       POSTERIOR TIBIAL  not palpable, no Doppler signal   not palpable, no Doppler signal        DORSALIS PEDIS      ANTERIOR TIBIAL not palpable, + Doppler signal  not palpable, + Doppler signal        PERONEAL non-Dopplerable    non-Dopplerable    Abdomen: soft, NT, no palapbe masses. Skin: no rashes, see Extremities.  Musculoskeletal: no muscle wasting or atrophy.  Neurologic:  A&O X 3; Appropriate Affect ; SENSATION: diminished in feet; MOTOR FUNCTION:  moving all extremities equally, motor strength 5/5 throughout. Speech is fluent/normal. CN 2-12 intact.    ASSESSMENT: [name]. is a 76 y.o. male who is s/p Right upper arm arteriovenous graft and Excision of right axillary epidermal inclusion cyst on 02-18-17 by Dr. Bridgett Larsson. Pt is scheduled to see Dr. Bridgett Larsson for follow up of the above surgery on 05-02-17.   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  He was last evaluated for PAD in April 2015.  There are + Doppler signals in bilateral DP arteries, no peroneal or PT signals bilaterally. No signs of ischemia on left foot or leg.   I discussed with Dr. Bridgett Larsson pt HPI and exam results, see Plan.   DATA  No test results for today, see Doppler exam results above.     PLAN:  Based on the patient's vascular studies and examination, pt will be scheduled for aortogram with bilateral run off, possible intervention on a Tuesday or a Thursday (non HD day) next week.    I discussed in depth with the patient the nature of atherosclerosis, and emphasized the importance of maximal medical management including strict control of blood pressure, blood glucose, and lipid levels, obtaining regular exercise, and continued cessation of smoking.  The patient is aware that without maximal medical management the underlying atherosclerotic disease process will progress, limiting the benefit of any interventions.  The patient was given information about PAD including signs, symptoms, treatment, what symptoms should prompt the patient to seek immediate medical care, and risk reduction measures to take.  Clemon Chambers, RN, MSN, FNP-C [facility] [employer] Phone: [phone]  Clinic MD: Chen/Cain  04/11/17 1:35 PM

## 2017-04-13 DIAGNOSIS — N2581 Secondary hyperparathyroidism of renal origin: Secondary | ICD-10-CM | POA: Diagnosis not present

## 2017-04-13 DIAGNOSIS — Z992 Dependence on renal dialysis: Secondary | ICD-10-CM | POA: Diagnosis not present

## 2017-04-13 DIAGNOSIS — M86171 Other acute osteomyelitis, right ankle and foot: Secondary | ICD-10-CM | POA: Diagnosis not present

## 2017-04-13 DIAGNOSIS — N186 End stage renal disease: Secondary | ICD-10-CM | POA: Diagnosis not present

## 2017-04-14 DIAGNOSIS — Z992 Dependence on renal dialysis: Secondary | ICD-10-CM | POA: Diagnosis not present

## 2017-04-14 DIAGNOSIS — M86171 Other acute osteomyelitis, right ankle and foot: Secondary | ICD-10-CM | POA: Diagnosis not present

## 2017-04-14 DIAGNOSIS — N186 End stage renal disease: Secondary | ICD-10-CM | POA: Diagnosis not present

## 2017-04-14 DIAGNOSIS — N2581 Secondary hyperparathyroidism of renal origin: Secondary | ICD-10-CM | POA: Diagnosis not present

## 2017-04-15 ENCOUNTER — Encounter (HOSPITAL_COMMUNITY): Payer: Self-pay

## 2017-04-15 ENCOUNTER — Other Ambulatory Visit: Payer: Self-pay

## 2017-04-15 ENCOUNTER — Ambulatory Visit (HOSPITAL_COMMUNITY): Admission: RE | Admit: 2017-04-15 | Payer: Medicare Other | Source: Ambulatory Visit

## 2017-04-15 ENCOUNTER — Encounter (HOSPITAL_COMMUNITY)
Admission: RE | Disposition: A | Discharge status: Home / Self Care | Payer: Self-pay | Source: Ambulatory Visit | Attending: Vascular Surgery

## 2017-04-15 ENCOUNTER — Ambulatory Visit (HOSPITAL_COMMUNITY)
Admission: RE | Admit: 2017-04-15 | Discharge: 2017-04-16 | Disposition: A | Discharge status: Home / Self Care | Payer: Medicare Other | Source: Ambulatory Visit | Attending: Vascular Surgery | Admitting: Vascular Surgery

## 2017-04-15 DIAGNOSIS — F419 Anxiety disorder, unspecified: Secondary | ICD-10-CM | POA: Insufficient documentation

## 2017-04-15 DIAGNOSIS — I739 Peripheral vascular disease, unspecified: Secondary | ICD-10-CM | POA: Diagnosis present

## 2017-04-15 DIAGNOSIS — Z79899 Other long term (current) drug therapy: Secondary | ICD-10-CM | POA: Diagnosis not present

## 2017-04-15 DIAGNOSIS — I70202 Unspecified atherosclerosis of native arteries of extremities, left leg: Secondary | ICD-10-CM | POA: Insufficient documentation

## 2017-04-15 DIAGNOSIS — I429 Cardiomyopathy, unspecified: Secondary | ICD-10-CM | POA: Diagnosis not present

## 2017-04-15 DIAGNOSIS — I69354 Hemiplegia and hemiparesis following cerebral infarction affecting left non-dominant side: Secondary | ICD-10-CM | POA: Insufficient documentation

## 2017-04-15 DIAGNOSIS — N186 End stage renal disease: Secondary | ICD-10-CM | POA: Diagnosis not present

## 2017-04-15 DIAGNOSIS — E1122 Type 2 diabetes mellitus with diabetic chronic kidney disease: Secondary | ICD-10-CM | POA: Insufficient documentation

## 2017-04-15 DIAGNOSIS — Z992 Dependence on renal dialysis: Secondary | ICD-10-CM | POA: Insufficient documentation

## 2017-04-15 DIAGNOSIS — I70261 Atherosclerosis of native arteries of extremities with gangrene, right leg: Secondary | ICD-10-CM | POA: Diagnosis not present

## 2017-04-15 DIAGNOSIS — Z87891 Personal history of nicotine dependence: Secondary | ICD-10-CM | POA: Diagnosis not present

## 2017-04-15 DIAGNOSIS — I120 Hypertensive chronic kidney disease with stage 5 chronic kidney disease or end stage renal disease: Secondary | ICD-10-CM | POA: Diagnosis not present

## 2017-04-15 DIAGNOSIS — E1152 Type 2 diabetes mellitus with diabetic peripheral angiopathy with gangrene: Secondary | ICD-10-CM | POA: Insufficient documentation

## 2017-04-15 HISTORY — PX: ABDOMINAL AORTOGRAM W/LOWER EXTREMITY: CATH118223

## 2017-04-15 HISTORY — PX: ULTRASOUND GUIDANCE FOR VASCULAR ACCESS: SHX6516

## 2017-04-15 LAB — POCT I-STAT, CHEM 8
BUN: 17 mg/dL (ref 6–20)
CALCIUM ION: 0.97 mmol/L — AB (ref 1.15–1.40)
CREATININE: 7.6 mg/dL — AB (ref 0.61–1.24)
Chloride: 97 mmol/L — ABNORMAL LOW (ref 101–111)
Glucose, Bld: 85 mg/dL (ref 65–99)
HCT: 42 % (ref 39.0–52.0)
HEMOGLOBIN: 14.3 g/dL (ref 13.0–17.0)
Potassium: 3.5 mmol/L (ref 3.5–5.1)
Sodium: 137 mmol/L (ref 135–145)
TCO2: 26 mmol/L (ref 22–32)

## 2017-04-15 SURGERY — ABDOMINAL AORTOGRAM W/LOWER EXTREMITY
Anesthesia: LOCAL

## 2017-04-15 MED ORDER — SODIUM CHLORIDE 0.9% FLUSH: 3.0000 mL | INTRAVENOUS | Status: DC | PRN

## 2017-04-15 MED ORDER — IODIXANOL 320 MG/ML IV SOLN
INTRAVENOUS | Status: DC | PRN
  Administered 2017-04-15: 110 mL via INTRA_ARTERIAL

## 2017-04-15 MED ORDER — SODIUM CHLORIDE 0.9 % IV SOLN
INTRAVENOUS | Status: AC | PRN
  Administered 2017-04-15: 10 mL/h via INTRAVENOUS

## 2017-04-15 MED ORDER — HYDRALAZINE HCL 20 MG/ML IJ SOLN: 10.0000 mg | INTRAMUSCULAR | Status: DC | PRN

## 2017-04-15 MED ORDER — LEVOFLOXACIN 500 MG PO TABS
500.0000 mg | ORAL_TABLET | ORAL | Status: DC
  Filled 2017-04-15: qty 1

## 2017-04-15 MED ORDER — LORATADINE 10 MG PO TABS: 10.0000 mg | ORAL_TABLET | ORAL | Status: DC | PRN

## 2017-04-15 MED ORDER — RENA-VITE PO TABS: 1.0000 | ORAL_TABLET | Freq: Every day | ORAL | Status: DC

## 2017-04-15 MED ORDER — LABETALOL HCL 5 MG/ML IV SOLN: 10.0000 mg | INTRAVENOUS | Status: DC | PRN

## 2017-04-15 MED ORDER — LABETALOL HCL 5 MG/ML IV SOLN
INTRAVENOUS | Status: AC
  Filled 2017-04-15: qty 4

## 2017-04-15 MED ORDER — HEPARIN (PORCINE) IN NACL 2-0.9 UNIT/ML-% IJ SOLN
INTRAMUSCULAR | Status: AC | PRN
  Administered 2017-04-15: 1000 mL

## 2017-04-15 MED ORDER — MIDAZOLAM HCL 2 MG/2ML IJ SOLN
INTRAMUSCULAR | Status: DC | PRN
  Administered 2017-04-15: 1 mg via INTRAVENOUS

## 2017-04-15 MED ORDER — ONDANSETRON HCL 4 MG/2ML IJ SOLN: 4.0000 mg | Freq: Four times a day (QID) | INTRAMUSCULAR | Status: DC | PRN

## 2017-04-15 MED ORDER — FENTANYL CITRATE (PF) 100 MCG/2ML IJ SOLN
INTRAMUSCULAR | Status: AC
  Filled 2017-04-15: qty 2

## 2017-04-15 MED ORDER — ACETAMINOPHEN 325 MG PO TABS: 650.0000 mg | ORAL_TABLET | ORAL | Status: DC | PRN

## 2017-04-15 MED ORDER — SEVELAMER CARBONATE 800 MG PO TABS
2400.0000 mg | ORAL_TABLET | Freq: Three times a day (TID) | ORAL | Status: DC
  Administered 2017-04-15 – 2017-04-16 (×2): 2400 mg via ORAL
  Filled 2017-04-15 (×2): qty 3

## 2017-04-15 MED ORDER — PROMETHAZINE HCL 25 MG PO TABS: 25.0000 mg | ORAL_TABLET | Freq: Three times a day (TID) | ORAL | Status: DC | PRN

## 2017-04-15 MED ORDER — MIDAZOLAM HCL 2 MG/2ML IJ SOLN
INTRAMUSCULAR | Status: AC
  Filled 2017-04-15: qty 2

## 2017-04-15 MED ORDER — SODIUM CHLORIDE 0.9% FLUSH: 3.0000 mL | Freq: Two times a day (BID) | INTRAVENOUS | Status: DC

## 2017-04-15 MED ORDER — HYDRALAZINE HCL 20 MG/ML IJ SOLN: 5.0000 mg | INTRAMUSCULAR | Status: DC | PRN

## 2017-04-15 MED ORDER — MECLIZINE HCL 25 MG PO TABS: 25.0000 mg | ORAL_TABLET | Freq: Every day | ORAL | Status: DC | PRN

## 2017-04-15 MED ORDER — FENTANYL CITRATE (PF) 100 MCG/2ML IJ SOLN
INTRAMUSCULAR | Status: DC | PRN
  Administered 2017-04-15: 50 ug via INTRAVENOUS

## 2017-04-15 MED ORDER — CINACALCET HCL 30 MG PO TABS
60.0000 mg | ORAL_TABLET | Freq: Every day | ORAL | Status: DC
  Administered 2017-04-15: 60 mg via ORAL
  Filled 2017-04-15: qty 2

## 2017-04-15 MED ORDER — SODIUM CHLORIDE 0.9 % IV SOLN: 250.0000 mL | INTRAVENOUS | Status: DC | PRN

## 2017-04-15 MED ORDER — SEVELAMER CARBONATE 800 MG PO TABS: 1600.0000 mg | ORAL_TABLET | Freq: Two times a day (BID) | ORAL | Status: DC | PRN

## 2017-04-15 MED ORDER — LIDOCAINE HCL (PF) 1 % IJ SOLN
INTRAMUSCULAR | Status: DC | PRN
  Administered 2017-04-15: 15 mL

## 2017-04-15 MED ORDER — SEVELAMER CARBONATE 800 MG PO TABS: 16.0000 mg | ORAL_TABLET | ORAL | Status: DC

## 2017-04-15 MED ORDER — LIDOCAINE HCL (PF) 1 % IJ SOLN
INTRAMUSCULAR | Status: AC
  Filled 2017-04-15: qty 30

## 2017-04-15 MED ORDER — SODIUM CHLORIDE 0.9% FLUSH
3.0000 mL | Freq: Two times a day (BID) | INTRAVENOUS | Status: DC
  Administered 2017-04-16: 3 mL via INTRAVENOUS

## 2017-04-15 MED ORDER — ONDANSETRON HCL 4 MG PO TABS: 8.0000 mg | ORAL_TABLET | Freq: Three times a day (TID) | ORAL | Status: DC | PRN

## 2017-04-15 MED ORDER — ATORVASTATIN CALCIUM 40 MG PO TABS
40.0000 mg | ORAL_TABLET | Freq: Every day | ORAL | Status: DC
  Administered 2017-04-16: 40 mg via ORAL
  Filled 2017-04-15: qty 1

## 2017-04-15 MED ORDER — LABETALOL HCL 5 MG/ML IV SOLN
INTRAVENOUS | Status: DC | PRN
  Administered 2017-04-15 (×2): 10 mg via INTRAVENOUS

## 2017-04-15 MED ORDER — HEPARIN (PORCINE) IN NACL 2-0.9 UNIT/ML-% IJ SOLN
INTRAMUSCULAR | Status: AC
  Filled 2017-04-15: qty 1000

## 2017-04-15 MED ORDER — ALPRAZOLAM 0.5 MG PO TABS
0.5000 mg | ORAL_TABLET | Freq: Every day | ORAL | Status: DC
  Administered 2017-04-16: 0.5 mg via ORAL
  Filled 2017-04-15: qty 1

## 2017-04-15 SURGICAL SUPPLY — 13 items
CATH ANGIO 5F PIGTAIL 65CM (CATHETERS) ×1 IMPLANT
CATH SOFT-VU 4F 65 STRAIGHT (CATHETERS) IMPLANT
CATH SOFT-VU STRAIGHT 4F 65CM (CATHETERS) ×3
CATH TEMPO 5F RIM 65CM (CATHETERS) ×1 IMPLANT
COVER PRB 48X5XTLSCP FOLD TPE (BAG) IMPLANT
COVER PROBE 5X48 (BAG) ×3
KIT MICROINTRODUCER STIFF 5F (SHEATH) ×1 IMPLANT
KIT PV (KITS) ×3 IMPLANT
SHEATH PINNACLE 5F 10CM (SHEATH) ×1 IMPLANT
SYR MEDRAD MARK V 150ML (SYRINGE) ×3 IMPLANT
TRANSDUCER W/STOPCOCK (MISCELLANEOUS) ×3 IMPLANT
TRAY PV CATH (CUSTOM PROCEDURE TRAY) ×3 IMPLANT
WIRE HITORQ VERSACORE ST 145CM (WIRE) ×1 IMPLANT

## 2017-04-15 NOTE — Op Note (Signed)
   PATIENT: [name].      MRN: [id number] DOB: 1940-09-05    DATE OF PROCEDURE: 04/15/2017  INDICATIONS:    [name]. is a 76 y.o. 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  The patient was set up for an arteriogram with plans to follow-up with Dr. Bridgett Larsson after.   PROCEDURE:    1.  Ultrasound-guided access to the left common femoral artery 2.  Aortogram with bilateral iliac arteriogram 3.  Selective catheterization of the right external iliac artery with right lower extremity runoff 4.  Retrograde left femoral arteriogram with left lower extremity runoff  SURGEON: Judeth Cornfield. Scot Dock, MD, FACS  ANESTHESIA: Local with sedation  EBL: Minimal  TECHNIQUE: The patient was taken to the peripheral vascular lab and was sedated.  The period of conscious sedation was 30 minutes.  During that time period, I was present face-to-face 100% of the time.  The patient was administered 1 mg of Versed and 50 mcg of fentanyl. The patient's heart rate, blood pressure, and oxygen saturation were monitored by the nurse continuously during the procedure.  Both groins were prepped and draped in usual sterile fashion.  Under ultrasound guidance, after the skin was anesthetized, the left common femoral artery was cannulated with a micropuncture needle and a micropuncture sheath introduced over a wire.  This was exchanged for a 5 French sheath over a versa core wire.  The pigtail catheter was positioned at the L1 vertebral body and flush aortogram obtained.  This was exchanged for a crossover catheter which was positioned into the right common iliac artery.  Prior to this I did obtain an oblique iliac projection.  The wire was advanced down to the common femoral artery and the crossover catheter exchanged for a straight catheter.  Selective right external iliac arteriogram was obtained with right lower extremity runoff.  The straight catheter was then removed and  a retrograde left femoral arteriogram obtained.  The patient was then transferred to the holding area for removal of the sheath.  No immediate complications were noted.   FINDINGS:   1.  Single renal arteries bilaterally with no significant renal artery stenosis identified.  The infrarenal aorta, bilateral common iliac arteries, bilateral external iliac arteries, and bilateral internal iliac arteries have diffuse calcific disease but no focal stenosis. 2.  On the right side, which is the site of concern, the common femoral, superficial femoral, deep femoral, popliteal, and anterior tibial arteries are patent.  There is diffuse calcific disease and irregularity but no focal stenosis.  The posterior tibial and peroneal arteries are occluded on the right. 3.  On the left side, the common femoral, superficial femoral, deep femoral, popliteal, and anterior tibial arteries are patent.  They have diffuse calcific disease and irregularity but no focal stenosis.  The anterior tibial artery does have diffuse moderate to severe disease.  The peroneal and posterior tibial arteries on the left are occluded.  CLINICAL NOTE: Patient has continuous flow through the anterior tibial artery on the right with no options for revascularization.  He has an appointment to follow with Dr. Bridgett Larsson in 2 weeks to follow the right foot wound.  Deitra Mayo, MD, FACS [facility]  DATE OF DICTATION:   04/15/2017

## 2017-04-15 NOTE — Progress Notes (Signed)
2nd reminder for patient not to move left leg. Post sheath removal bedrest instructions reiterated.

## 2017-04-15 NOTE — Interval H&P Note (Signed)
History and Physical Interval Note:  04/15/2017 11:54 AM  Joseph Maryland Sr.  has presented today for surgery, with the diagnosis of pvd with gaingrene  The various methods of treatment have been discussed with the patient and family. After consideration of risks, benefits and other options for treatment, the patient has consented to  Procedure(s): ABDOMINAL AORTOGRAM W/LOWER EXTREMITY (N/A) as a surgical intervention .  The patient's history has been reviewed, patient examined, no change in status, stable for surgery.  I have reviewed the patient's chart and labs.  Questions were answered to the patient's satisfaction.     Deitra Mayo

## 2017-04-15 NOTE — Plan of Care (Signed)
  Education: Knowledge of General Education information will improve 04/15/2017 2249 - Progressing by Drenda Freeze, RN

## 2017-04-15 NOTE — Progress Notes (Signed)
29fr sheath aspirated and removed from LFA, manual pressure applied for 20 minutes. Groin level 0, tegaderm dressing applied.   Left dp pulse present with doppler. Right peroneal  Pulse present with doppler.  Bedrest begins a t13:30:00

## 2017-04-16 ENCOUNTER — Encounter (HOSPITAL_COMMUNITY): Payer: Self-pay | Admitting: Vascular Surgery

## 2017-04-16 DIAGNOSIS — M86171 Other acute osteomyelitis, right ankle and foot: Secondary | ICD-10-CM | POA: Diagnosis not present

## 2017-04-16 DIAGNOSIS — N186 End stage renal disease: Secondary | ICD-10-CM | POA: Diagnosis not present

## 2017-04-16 DIAGNOSIS — E1122 Type 2 diabetes mellitus with diabetic chronic kidney disease: Secondary | ICD-10-CM | POA: Diagnosis not present

## 2017-04-16 DIAGNOSIS — I70261 Atherosclerosis of native arteries of extremities with gangrene, right leg: Secondary | ICD-10-CM | POA: Diagnosis not present

## 2017-04-16 DIAGNOSIS — I70202 Unspecified atherosclerosis of native arteries of extremities, left leg: Secondary | ICD-10-CM | POA: Diagnosis not present

## 2017-04-16 DIAGNOSIS — Z992 Dependence on renal dialysis: Secondary | ICD-10-CM | POA: Diagnosis not present

## 2017-04-16 DIAGNOSIS — N2581 Secondary hyperparathyroidism of renal origin: Secondary | ICD-10-CM | POA: Diagnosis not present

## 2017-04-16 DIAGNOSIS — I120 Hypertensive chronic kidney disease with stage 5 chronic kidney disease or end stage renal disease: Secondary | ICD-10-CM | POA: Diagnosis not present

## 2017-04-16 DIAGNOSIS — F419 Anxiety disorder, unspecified: Secondary | ICD-10-CM | POA: Diagnosis not present

## 2017-04-16 DIAGNOSIS — E1152 Type 2 diabetes mellitus with diabetic peripheral angiopathy with gangrene: Secondary | ICD-10-CM | POA: Diagnosis not present

## 2017-04-16 NOTE — Progress Notes (Signed)
   VASCULAR SURGERY ASSESSMENT & PLAN:   Home today.  He already has a follow-up appointment scheduled with Dr. Bridgett Larsson   SUBJECTIVE:   No complaints  PHYSICAL EXAM:   Vitals:   04/15/17 1515 04/15/17 1600 04/15/17 1630 04/15/17 2128  BP: (!) 147/73 123/69 127/63 121/63  Pulse: 82 79 (!) 40 84  Resp: 14 (!) 22 15 16   Temp:    97.9 F (36.6 C)  TempSrc:    Oral  SpO2: 98% 100% 100% 98%  Weight:      Height:       No hematoma left groin  LABS:   Lab Results  Component Value Date   WBC 7.8 03/31/2017   HGB 14.3 04/15/2017   HCT 42.0 04/15/2017   MCV 66.7 (L) 03/31/2017   PLT 223 03/31/2017   Lab Results  Component Value Date   CREATININE 7.60 (H) 04/15/2017   Lab Results  Component Value Date   INR 1.02 07/06/2015    PROBLEM LIST:    Active Problems:   PVD (peripheral vascular disease) (HCC)   CURRENT MEDS:   . ALPRAZolam  0.5 mg Oral Daily  . atorvastatin  40 mg Oral Daily  . cinacalcet  60 mg Oral Q supper  . levofloxacin  500 mg Oral Q48H  . multivitamin  1 tablet Oral QHS  . sevelamer carbonate  2,400 mg Oral TID WC  . sodium chloride flush  3 mL Intravenous Q12H    Deitra Mayo Beeper: [phone] Office: [phone] 04/16/2017

## 2017-04-16 NOTE — Progress Notes (Signed)
Pt to discharge home, per order. IV and telemetry box removed. Pt received discharge instructions and all questions were answered. Pt has all belongings packed and is dressed. Pt waiting for ride to arrived.    Grant Fontana BSN, RN

## 2017-04-16 NOTE — Discharge Summary (Signed)
Physician Discharge Summary   Patient ID: [name] [id number] 76 y.o. 01-25-1941  Admit date: 04/15/2017  Discharge date and time: 04/16/17   Admitting Physician: Angelia Mould, MD   Discharge Physician: same  Admission Diagnoses: PVD (peripheral vascular disease) [facility]) [I73.9]  Discharge Diagnoses: same  Admission Condition: good  Discharged Condition: good  Indication for Admission: gangrene R foot  Hospital Course: [name] is a 76 year old male who came in as an outpatient for aortogram with bilateral lower extremity runoff by Dr. Doren Custard on 04/15/17 due to ischemic tissue changes of right foot.  He tolerated this procedure well and was admitted to the hospital overnight to monitor catheterization site as well as a circulation.  POD #1 catheterization site is without palpable hematoma or pseudoaneurysm and patient is feeling fit for discharge.  He will follow-up in office with Dr. Bridgett Larsson to monitor tissue changes of right foot in about 2 weeks.  Discharge instructions were reviewed with the patient and he voices understanding.  He will be discharged this morning in stable condition.  Consults: None  Treatments: surgery: Aortogram with BLE runoff by Dr. Scot Dock 04/15/17  Discharge Exam: See progress note 12/19 Vitals:   04/15/17 2128 04/16/17 0641  BP: 121/63 118/71  Pulse: 84 83  Resp: 16 19  Temp: 97.9 F (36.6 C) (!) 97.5 F (36.4 C)  SpO2: 98% 100%     Disposition: 01-Home or Self Care  Patient Instructions:  Allergies as of 04/16/2017      Reactions   Aspirin Other (See Comments)   INTERNAL BLEEDING      Medication List    TAKE these medications   ALPRAZolam 0.5 MG tablet Commonly known as:  XANAX Take 0.5 mg by mouth daily.   atorvastatin 40 MG tablet Commonly known as:  LIPITOR Take 40 mg by mouth daily.   b complex-vitamin c-folic acid 0.8 MG Tabs tablet Take 0.8 mg by mouth See admin instructions. Takes on Tuesdays, Thursdays,  Saturdays, and Sundays. Does not take on Mondays, Wednesdays, and Fridays due to Dialysis treatments.   cinacalcet 60 MG tablet Commonly known as:  SENSIPAR Take 60 mg by mouth every evening. With evening meal   levofloxacin 500 MG tablet Commonly known as:  LEVAQUIN Take 1 tablet (500 mg total) by mouth every other day. Take first dose on December 5, take after dialysis   loratadine 10 MG tablet Commonly known as:  CLARITIN Take 1 tablet (10 mg total) by mouth every other day as needed for allergies.   meclizine 25 MG tablet Commonly known as:  ANTIVERT Take 25 mg by mouth daily as needed for dizziness (Non dialysis days).   ondansetron 8 MG tablet Commonly known as:  ZOFRAN Take 1 tablet (8 mg total) by mouth every 8 (eight) hours as needed for nausea or vomiting.   promethazine 25 MG tablet Commonly known as:  PHENERGAN Take 25 mg by mouth every 8 (eight) hours as needed for nausea or vomiting.   sevelamer carbonate 800 MG tablet Commonly known as:  RENVELA Take 16 mg by mouth See admin instructions. Take 2400  mg by mouth 3 times daily with full meals and take 1600 mg by mouth with snacks.      Activity: activity as tolerated Diet: regular diet Wound Care: as directed  Follow-up with Dr. Bridgett Larsson as scheduled  Signed: Dagoberto Ligas 04/16/2017 7:13 AM

## 2017-04-16 NOTE — Discharge Instructions (Signed)
° °  [facility] of [place] ° °Discharge Instructions ° °Lower Extremity Angiogram; Angioplasty/Stenting ° °Please refer to the following instructions for your post-procedure care. Your surgeon or physician assistant will discuss any changes with you. ° °Activity ° °Avoid lifting more than 8 pounds (1 gallons of milk) for 72 hours (3 days) after your procedure. You may walk as much as you can tolerate. It's OK to drive after 72 hours. ° °Bathing/Showering ° °You may shower the day after your procedure. If you have a bandage, you may remove it at 24- 48 hours. Clean your incision site with mild soap and water. Pat the area dry with a clean towel. ° °Diet ° °Resume your pre-procedure diet. There are no special food restrictions following this procedure. All patients with peripheral vascular disease should follow a low fat/low cholesterol diet. In order to heal from your surgery, it is CRITICAL to get adequate nutrition. Your body requires vitamins, minerals, and protein. Vegetables are the best source of vitamins and minerals. Vegetables also provide the perfect balance of protein. Processed food has little nutritional value, so try to avoid this. ° °Medications ° °Resume taking all of your medications unless your doctor tells you not to. If your incision is causing pain, you may take over-the-counter pain relievers such as acetaminophen (Tylenol) ° °Follow Up ° °Follow up will be arranged at the time of your procedure. You may have an office visit scheduled or may be scheduled for surgery. Ask your surgeon if you have any questions. ° °Please call us immediately for any of the following conditions: °•Severe or worsening pain your legs or feet at rest or with walking. °•Increased pain, redness, drainage at your groin puncture site. °•Fever of 101 degrees or higher. °•If you have any mild or slow bleeding from your puncture site: lie down, apply firm constant pressure over the area with a piece of  gauze or a clean wash cloth for 30 minutes- no peeking!, call 911 right away if you are still bleeding after 30 minutes, or if the bleeding is heavy and unmanageable. ° °Reduce your risk factors of vascular disease: ° °Stop smoking. If you would like help call [facility] at 1-800-QUIT-NOW (1-800-784-8669) or [place] at 336-586-4000. °Manage your cholesterol °Maintain a desired weight °Control your diabetes °Keep your blood pressure down ° °If you have any questions, please call the office at [phone] ° °

## 2017-04-18 DIAGNOSIS — M86171 Other acute osteomyelitis, right ankle and foot: Secondary | ICD-10-CM | POA: Diagnosis not present

## 2017-04-18 DIAGNOSIS — Z992 Dependence on renal dialysis: Secondary | ICD-10-CM | POA: Diagnosis not present

## 2017-04-18 DIAGNOSIS — N186 End stage renal disease: Secondary | ICD-10-CM | POA: Diagnosis not present

## 2017-04-18 DIAGNOSIS — N2581 Secondary hyperparathyroidism of renal origin: Secondary | ICD-10-CM | POA: Diagnosis not present

## 2017-04-20 DIAGNOSIS — N2581 Secondary hyperparathyroidism of renal origin: Secondary | ICD-10-CM | POA: Diagnosis not present

## 2017-04-20 DIAGNOSIS — N186 End stage renal disease: Secondary | ICD-10-CM | POA: Diagnosis not present

## 2017-04-20 DIAGNOSIS — M86171 Other acute osteomyelitis, right ankle and foot: Secondary | ICD-10-CM | POA: Diagnosis not present

## 2017-04-20 DIAGNOSIS — Z992 Dependence on renal dialysis: Secondary | ICD-10-CM | POA: Diagnosis not present

## 2017-04-23 DIAGNOSIS — N2581 Secondary hyperparathyroidism of renal origin: Secondary | ICD-10-CM | POA: Diagnosis not present

## 2017-04-23 DIAGNOSIS — N186 End stage renal disease: Secondary | ICD-10-CM | POA: Diagnosis not present

## 2017-04-23 DIAGNOSIS — Z992 Dependence on renal dialysis: Secondary | ICD-10-CM | POA: Diagnosis not present

## 2017-04-23 DIAGNOSIS — M86171 Other acute osteomyelitis, right ankle and foot: Secondary | ICD-10-CM | POA: Diagnosis not present

## 2017-04-25 DIAGNOSIS — M86171 Other acute osteomyelitis, right ankle and foot: Secondary | ICD-10-CM | POA: Diagnosis not present

## 2017-04-25 DIAGNOSIS — Z992 Dependence on renal dialysis: Secondary | ICD-10-CM | POA: Diagnosis not present

## 2017-04-25 DIAGNOSIS — N186 End stage renal disease: Secondary | ICD-10-CM | POA: Diagnosis not present

## 2017-04-25 DIAGNOSIS — N2581 Secondary hyperparathyroidism of renal origin: Secondary | ICD-10-CM | POA: Diagnosis not present

## 2017-04-28 DIAGNOSIS — N2581 Secondary hyperparathyroidism of renal origin: Secondary | ICD-10-CM | POA: Diagnosis not present

## 2017-04-28 DIAGNOSIS — M86171 Other acute osteomyelitis, right ankle and foot: Secondary | ICD-10-CM | POA: Diagnosis not present

## 2017-04-28 DIAGNOSIS — Z992 Dependence on renal dialysis: Secondary | ICD-10-CM | POA: Diagnosis not present

## 2017-04-28 DIAGNOSIS — N186 End stage renal disease: Secondary | ICD-10-CM | POA: Diagnosis not present

## 2017-04-29 DIAGNOSIS — Z992 Dependence on renal dialysis: Secondary | ICD-10-CM | POA: Diagnosis not present

## 2017-04-29 DIAGNOSIS — N186 End stage renal disease: Secondary | ICD-10-CM | POA: Diagnosis not present

## 2017-04-29 DIAGNOSIS — D509 Iron deficiency anemia, unspecified: Secondary | ICD-10-CM | POA: Diagnosis not present

## 2017-04-29 DIAGNOSIS — N2581 Secondary hyperparathyroidism of renal origin: Secondary | ICD-10-CM | POA: Diagnosis not present

## 2017-04-30 DIAGNOSIS — Z992 Dependence on renal dialysis: Secondary | ICD-10-CM | POA: Diagnosis not present

## 2017-04-30 DIAGNOSIS — N186 End stage renal disease: Secondary | ICD-10-CM | POA: Diagnosis not present

## 2017-04-30 DIAGNOSIS — D509 Iron deficiency anemia, unspecified: Secondary | ICD-10-CM | POA: Diagnosis not present

## 2017-04-30 DIAGNOSIS — N2581 Secondary hyperparathyroidism of renal origin: Secondary | ICD-10-CM | POA: Diagnosis not present

## 2017-04-30 NOTE — Progress Notes (Signed)
[name]    Postoperative Access Visit   History of Present Illness   Joseph Conly. is a 77 y.o. year old male who presents for postoperative follow-up for: right upper arm arteriovenous graft, EIC excision (Date: 02/18/17).  The patient's wounds are healed.  The patient notes no steal symptoms.  The patient is able to complete their activities of daily living.  The patient's current symptoms are: none.  In regards to his R foot ischemia, toes are asx with some progression in ischemia.   Physical Examination   Vitals:   05/01/17 0841 05/01/17 0844  BP: (!) 153/87 (!) 142/71  Pulse: 95 (!) 110  Resp: 18   Temp: (!) 97.4 F (36.3 C)   TempSrc: Oral   SpO2: 98%   Weight: 175 lb (79.4 kg)   Height: 5\' 5"  (1.651 m)     right arm Incisions are healed, skin feels warm, hand grip is 5/5, sensation in digits is intact, palpable thrill, bruit can be auscultated   R foot: dry gangrene toe #2, ischemic toes #1&#3  Medical Decision Making   Joseph PRINSEN Sr. is a 77 y.o. year old male who presents s/p right upper arm arteriovenous graft, EIC excision, R toes ischemia   The patient's access is ready for use.  The patient's tunneled dialysis catheter can be removed after two successful cannulations and completed dialysis treatments.  I review Dr. Nicole Cella recent angiogram and will refer patient to Dr. Sharol Given for R TMA.  Thank you for allowing [place] to participate in this patient's care.   Adele Barthel, MD, FACS [facility] Office: [phone] Pager: [phone]

## 2017-05-01 ENCOUNTER — Encounter: Payer: Self-pay | Admitting: Vascular Surgery

## 2017-05-01 ENCOUNTER — Other Ambulatory Visit: Payer: Self-pay

## 2017-05-01 ENCOUNTER — Ambulatory Visit (INDEPENDENT_AMBULATORY_CARE_PROVIDER_SITE_OTHER): Payer: Medicare Other | Admitting: Vascular Surgery

## 2017-05-01 VITALS — BP 142/71 | HR 110 | Temp 97.4°F | Resp 18 | Ht 65.0 in | Wt 175.0 lb

## 2017-05-01 DIAGNOSIS — N186 End stage renal disease: Secondary | ICD-10-CM

## 2017-05-01 DIAGNOSIS — Z992 Dependence on renal dialysis: Secondary | ICD-10-CM

## 2017-05-01 DIAGNOSIS — I739 Peripheral vascular disease, unspecified: Secondary | ICD-10-CM

## 2017-05-02 ENCOUNTER — Encounter: Payer: Medicare Other | Admitting: Vascular Surgery

## 2017-05-02 DIAGNOSIS — N186 End stage renal disease: Secondary | ICD-10-CM | POA: Diagnosis not present

## 2017-05-02 DIAGNOSIS — Z992 Dependence on renal dialysis: Secondary | ICD-10-CM | POA: Diagnosis not present

## 2017-05-02 DIAGNOSIS — N2581 Secondary hyperparathyroidism of renal origin: Secondary | ICD-10-CM | POA: Diagnosis not present

## 2017-05-02 DIAGNOSIS — D509 Iron deficiency anemia, unspecified: Secondary | ICD-10-CM | POA: Diagnosis not present

## 2017-05-05 DIAGNOSIS — N186 End stage renal disease: Secondary | ICD-10-CM | POA: Diagnosis not present

## 2017-05-05 DIAGNOSIS — D509 Iron deficiency anemia, unspecified: Secondary | ICD-10-CM | POA: Diagnosis not present

## 2017-05-05 DIAGNOSIS — N2581 Secondary hyperparathyroidism of renal origin: Secondary | ICD-10-CM | POA: Diagnosis not present

## 2017-05-05 DIAGNOSIS — Z992 Dependence on renal dialysis: Secondary | ICD-10-CM | POA: Diagnosis not present

## 2017-05-07 DIAGNOSIS — N2581 Secondary hyperparathyroidism of renal origin: Secondary | ICD-10-CM | POA: Diagnosis not present

## 2017-05-07 DIAGNOSIS — Z992 Dependence on renal dialysis: Secondary | ICD-10-CM | POA: Diagnosis not present

## 2017-05-07 DIAGNOSIS — D509 Iron deficiency anemia, unspecified: Secondary | ICD-10-CM | POA: Diagnosis not present

## 2017-05-07 DIAGNOSIS — N186 End stage renal disease: Secondary | ICD-10-CM | POA: Diagnosis not present

## 2017-05-08 ENCOUNTER — Encounter (INDEPENDENT_AMBULATORY_CARE_PROVIDER_SITE_OTHER): Payer: Self-pay | Admitting: Orthopedic Surgery

## 2017-05-08 ENCOUNTER — Ambulatory Visit (INDEPENDENT_AMBULATORY_CARE_PROVIDER_SITE_OTHER): Payer: Medicare Other | Admitting: Orthopedic Surgery

## 2017-05-08 ENCOUNTER — Other Ambulatory Visit (INDEPENDENT_AMBULATORY_CARE_PROVIDER_SITE_OTHER): Payer: Self-pay | Admitting: Family

## 2017-05-08 DIAGNOSIS — I96 Gangrene, not elsewhere classified: Secondary | ICD-10-CM

## 2017-05-08 NOTE — Progress Notes (Signed)
Office Visit Note   Patient: Joseph ERMAN Sr.           Date of Birth: [birth year]           MRN: [id number] Visit Date: 05/08/2017              Requested by: [name], MD [address] [place], [place] [address] PCP: [name], MD  No chief complaint on file.     HPI: Patient is a 77 year old gentleman who is seen for initial evaluation for gangrene of the right forefoot.  Patient states that the fifth toe fell off due to the dry gangrene on its own.  Patient has been to see Dr. Bridgett Larsson and vascular surgery and does not have revascularization options.  Review of systems positive for acid reflux arthritis hypertension kidney disease stroke.  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  Assessment & Plan: Visit Diagnoses:  1. Gangrene of right foot ([place])     Plan: We will plan for a transmetatarsal amputation tomorrow.  Discussed risk of the wound not healing potential for discharge to skilled nursing.  Patient states he understands wished to proceed at this time.  Plan for dialysis postoperatively in the hospital.  Follow-Up Instructions: Return in about 2 weeks (around 05/22/2017).   Ortho Exam  Patient is alert, oriented, no adenopathy, well-dressed, normal affect, normal respiratory effort. Examination patient has an antalgic gait.  The skin is thin and atrophic in his right foot he does have a palpable dorsalis pedis pulse.  He has a healed fifth ray amputation the great toe second third and fourth toes have dry gangrenous changes there is no ascending cellulitis no odor no drainage.  Imaging: No results found. No images are attached to the encounter.  Labs: Lab Results  Component Value Date   REPTSTATUS 02/22/2017 FINAL 02/18/2017   GRAMSTAIN  02/18/2017    RARE WBC PRESENT, PREDOMINANTLY PMN FEW GRAM POSITIVE COCCI    CULT  02/18/2017    NO GROWTH AEROBICALLY MIXED ANAEROBIC FLORA PRESENT.  CALL LAB IF FURTHER IID  REQUIRED.     @LABSALLVALUES (HGBA1)@  There is no height or weight on file to calculate BMI.  Orders:  No orders of the defined types were placed in this encounter.  No orders of the defined types were placed in this encounter.    Procedures: No procedures performed  Clinical Data: No additional findings.  ROS:  All other systems negative, except as noted in the HPI. Review of Systems  Objective: Vital Signs: There were no vitals taken for this visit.  Specialty Comments:  No specialty comments available.  PMFS History: Patient Active Problem List   Diagnosis Date Noted  . PVD (peripheral vascular disease) ([place]) 04/15/2017  . Nonischemic cardiomyopathy ([place]) 08/15/2015  . Aortic stenosis 08/15/2015  . Moderate aortic stenosis 07/12/2015  . 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 CAD- 40-50% LAD at cath 07/11/15 07/11/2015  . Aspirin intolerance 07/11/2015  . Abnormal stress test   . ESRD on dialysis ([place]) 05/30/2015  . CTS (carpal tunnel syndrome) 02/28/2015  . PVD of LE - Dr Oneida Alar follows 08/20/2012  . Ulcer of foot ([place]) 01/16/2012  . Encounter for screening colonoscopy 10/08/2011  . Melena 10/08/2011  . Other complications due to renal dialysis device, implant, and graft 09/24/2011   Past Medical History:  Diagnosis Date  . Anxiety   . Aortic stenosis   . Arthritis   . CVA (  cerebral infarction)   . ESRD (end stage renal disease) on dialysis [facility])    M/W/F at [facility] in [place]  . Essential hypertension   . Gastric ulcer 2004  . History of cardiomyopathy    LVEF normal as of February 2017  . History of stroke    Left side weakness  . Iron deficiency anemia     Family History  Problem Relation Age of Onset  . Hypertension Mother   . Colon cancer Neg Hx   . Liver disease Neg Hx     Past Surgical History:  Procedure Laterality Date  . ABDOMINAL AORTAGRAM N/A 01/24/2012   Procedure: ABDOMINAL Maxcine Ham;  Surgeon: Elam Dutch, MD;  Location: [facility] CATH LAB;  Service: Cardiovascular;  Laterality: N/A;  . ABDOMINAL AORTOGRAM W/LOWER EXTREMITY N/A 04/15/2017   Procedure: ABDOMINAL AORTOGRAM W/LOWER EXTREMITY;  Surgeon: Angelia Mould, MD;  Location: [place] CV LAB;  Service: Cardiovascular;  Laterality: N/A;  . ARTERIOVENOUS GRAFT PLACEMENT Right right arm  . AV FISTULA PLACEMENT Left 08/31/2015   Procedure: ARTERIOVENOUS (AV) FISTULA CREATION- LEFT RADIOCEPHALIC;  Surgeon: Mal Misty, MD;  Location: [place];  Service: Vascular;  Laterality: Left;  . AV FISTULA PLACEMENT Right 02/18/2017   Procedure: INSERTION OF ARTERIOVENOUS (AV) GORE-TEX GRAFT  RIGHT UPPER ARM;  Surgeon: Conrad Palomas, MD;  Location: [place];  Service: Vascular;  Laterality: Right;  . BASCILIC VEIN TRANSPOSITION Left 12/19/2015   Procedure: FIRST STAGE BRACHIAL VEIN TRANSPOSITION;  Surgeon: Conrad Kingsville, MD;  Location: [place];  Service: Vascular;  Laterality: Left;  . BASCILIC VEIN TRANSPOSITION Left 02/08/2016   Procedure: SECOND STAGE BRACHIAL VEIN TRANSPOSITION;  Surgeon: Conrad Daniel, MD;  Location: [place];  Service: Vascular;  Laterality: Left;  . CARDIAC CATHETERIZATION N/A 07/11/2015   Procedure: Left Heart Cath and Coronary Angiography;  Surgeon: Troy Sine, MD;  Location: [facility] CV LAB;  Service: Cardiovascular;  Laterality: N/A;  . Carpel Tunnel Left Dec. 22, 2016  . CHOLECYSTECTOMY    . COLONOSCOPY  2004   Dr. Irving Shows, left sided diverticula and cecal polyp, path unknown  . COLONOSCOPY  10/29/2011   Procedure: COLONOSCOPY;  Surgeon: Daneil Dolin, MD;  [facility];  Service: Endoscopy;  Laterality: N/A;  10:15  . ESOPHAGOGASTRODUODENOSCOPY  11/2002   Dr. Gala Romney, erosive reflux esophagitis, multiple gastric ulcer and antral/bulbar erosions. Serologies positive for H.Pylori and was treated  . ESOPHAGOGASTRODUODENOSCOPY  11/20014   Dr. Gala Romney, small hh only, ulcers healed  . ESOPHAGOGASTRODUODENOSCOPY   09/21/2011   Dr Trevor Iha [facility], antral erosions, ?early GAVE  . FISTULOGRAM Left 12/10/2016   Procedure: THROMBECTOMY OF LEFT ARM ARTERIOVENOUS FISTULA;  Surgeon: Waynetta Sandy, MD;  Location: [place];  Service: Vascular;  Laterality: Left;  . INSERTION OF DIALYSIS CATHETER Left 12/10/2016   Procedure: INSERTION OF TUNNELED DIALYSIS CATHETER;  Surgeon: Waynetta Sandy, MD;  Location: [facility];  Service: Vascular;  Laterality: Left;  . IR GENERIC HISTORICAL  07/16/2016   IR REMOVAL TUN CV CATH W/O FL 07/16/2016 Saverio Danker, PA-C [facility]  . LIGATION OF ARTERIOVENOUS  FISTULA Left 12/19/2015   Procedure: LIGATION OF RADIOCEPHALIC ARTERIOVENOUS  FISTULA;  Surgeon: Conrad Mount Eagle, MD;  Location: [place] [facility];  Service: Vascular;  Laterality: Left;  Marland Kitchen MASS EXCISION Right 02/18/2017   Procedure: EXCISION OF RIGHT AXILLARY EPIDERMAL INCLUSION CYST;  Surgeon: Conrad , MD;  Location: [place];  Service: Vascular;  Laterality: Right;  .  PERIPHERAL VASCULAR CATHETERIZATION N/A 12/14/2015   Procedure: Fistulagram;  Surgeon: Conrad Shishmaref, MD;  Location: [facility] CV LAB;  Service: Cardiovascular;  Laterality: N/A;  . SHOULDER SURGERY    . ULTRASOUND GUIDANCE FOR VASCULAR ACCESS  04/15/2017   Procedure: Ultrasound Guidance For Vascular Access;  Surgeon: Angelia Mould, MD;  Location: [facility] CV LAB;  Service: Cardiovascular;;  . UPPER EXTREMITY VENOGRAPHY Bilateral 12/17/2016   Procedure: Bilateral Upper Extremity Venography;  Surgeon: Serafina Mitchell, MD;  Location: [facility] CV LAB;  Service: Cardiovascular;  Laterality: Bilateral;   Social History   Occupational History  . Occupation: retired, Licensed conveyancer    Employer: RETIRED  Tobacco Use  . Smoking status: Former Smoker    Last attempt to quit: 04/29/2004    Years since quitting: 13.0  . Smokeless tobacco: Former Systems developer    Types: Chew    Quit date: 01/16/1987  . Tobacco comment: quit 2006  Substance and Sexual Activity  .  Alcohol use: No  . Drug use: No  . Sexual activity: Not on file

## 2017-05-09 ENCOUNTER — Encounter (HOSPITAL_COMMUNITY)
Admission: RE | Disposition: A | Discharge status: Skilled Nursing Facility | Payer: Self-pay | Source: Ambulatory Visit | Attending: Orthopedic Surgery

## 2017-05-09 ENCOUNTER — Inpatient Hospital Stay (HOSPITAL_COMMUNITY): Payer: Medicare Other | Admitting: Certified Registered Nurse Anesthetist

## 2017-05-09 ENCOUNTER — Inpatient Hospital Stay (HOSPITAL_COMMUNITY)
Admission: RE | Admit: 2017-05-09 | Discharge: 2017-05-13 | DRG: 239 | Disposition: A | Discharge status: Skilled Nursing Facility | Payer: Medicare Other | Source: Ambulatory Visit | Attending: Orthopedic Surgery | Admitting: Orthopedic Surgery

## 2017-05-09 ENCOUNTER — Other Ambulatory Visit: Payer: Self-pay

## 2017-05-09 ENCOUNTER — Encounter (HOSPITAL_COMMUNITY): Payer: Self-pay

## 2017-05-09 DIAGNOSIS — Z886 Allergy status to analgesic agent status: Secondary | ICD-10-CM

## 2017-05-09 DIAGNOSIS — Z9049 Acquired absence of other specified parts of digestive tract: Secondary | ICD-10-CM

## 2017-05-09 DIAGNOSIS — Z4901 Encounter for fitting and adjustment of extracorporeal dialysis catheter: Secondary | ICD-10-CM | POA: Diagnosis not present

## 2017-05-09 DIAGNOSIS — D509 Iron deficiency anemia, unspecified: Secondary | ICD-10-CM | POA: Diagnosis not present

## 2017-05-09 DIAGNOSIS — D631 Anemia in chronic kidney disease: Secondary | ICD-10-CM | POA: Diagnosis present

## 2017-05-09 DIAGNOSIS — N186 End stage renal disease: Secondary | ICD-10-CM

## 2017-05-09 DIAGNOSIS — F411 Generalized anxiety disorder: Secondary | ICD-10-CM | POA: Diagnosis not present

## 2017-05-09 DIAGNOSIS — Z8249 Family history of ischemic heart disease and other diseases of the circulatory system: Secondary | ICD-10-CM

## 2017-05-09 DIAGNOSIS — Z992 Dependence on renal dialysis: Secondary | ICD-10-CM | POA: Diagnosis not present

## 2017-05-09 DIAGNOSIS — N19 Unspecified kidney failure: Secondary | ICD-10-CM | POA: Diagnosis not present

## 2017-05-09 DIAGNOSIS — N185 Chronic kidney disease, stage 5: Secondary | ICD-10-CM | POA: Diagnosis not present

## 2017-05-09 DIAGNOSIS — I96 Gangrene, not elsewhere classified: Secondary | ICD-10-CM | POA: Diagnosis not present

## 2017-05-09 DIAGNOSIS — N179 Acute kidney failure, unspecified: Secondary | ICD-10-CM | POA: Diagnosis not present

## 2017-05-09 DIAGNOSIS — R2689 Other abnormalities of gait and mobility: Secondary | ICD-10-CM | POA: Diagnosis not present

## 2017-05-09 DIAGNOSIS — Z79899 Other long term (current) drug therapy: Secondary | ICD-10-CM | POA: Diagnosis not present

## 2017-05-09 DIAGNOSIS — M6281 Muscle weakness (generalized): Secondary | ICD-10-CM | POA: Diagnosis not present

## 2017-05-09 DIAGNOSIS — I10 Essential (primary) hypertension: Secondary | ICD-10-CM | POA: Diagnosis not present

## 2017-05-09 DIAGNOSIS — N2581 Secondary hyperparathyroidism of renal origin: Secondary | ICD-10-CM | POA: Diagnosis not present

## 2017-05-09 DIAGNOSIS — F419 Anxiety disorder, unspecified: Secondary | ICD-10-CM | POA: Diagnosis present

## 2017-05-09 DIAGNOSIS — I350 Nonrheumatic aortic (valve) stenosis: Secondary | ICD-10-CM | POA: Diagnosis not present

## 2017-05-09 DIAGNOSIS — Z89431 Acquired absence of right foot: Secondary | ICD-10-CM

## 2017-05-09 DIAGNOSIS — I120 Hypertensive chronic kidney disease with stage 5 chronic kidney disease or end stage renal disease: Secondary | ICD-10-CM | POA: Diagnosis present

## 2017-05-09 DIAGNOSIS — I69354 Hemiplegia and hemiparesis following cerebral infarction affecting left non-dominant side: Secondary | ICD-10-CM

## 2017-05-09 DIAGNOSIS — I739 Peripheral vascular disease, unspecified: Secondary | ICD-10-CM | POA: Diagnosis not present

## 2017-05-09 DIAGNOSIS — E785 Hyperlipidemia, unspecified: Secondary | ICD-10-CM | POA: Diagnosis not present

## 2017-05-09 DIAGNOSIS — I429 Cardiomyopathy, unspecified: Secondary | ICD-10-CM | POA: Diagnosis not present

## 2017-05-09 DIAGNOSIS — E7849 Other hyperlipidemia: Secondary | ICD-10-CM | POA: Diagnosis not present

## 2017-05-09 DIAGNOSIS — S98919D Complete traumatic amputation of unspecified foot, level unspecified, subsequent encounter: Secondary | ICD-10-CM | POA: Diagnosis not present

## 2017-05-09 DIAGNOSIS — Z4781 Encounter for orthopedic aftercare following surgical amputation: Secondary | ICD-10-CM | POA: Diagnosis not present

## 2017-05-09 HISTORY — PX: AMPUTATION: SHX166

## 2017-05-09 HISTORY — DX: Gangrene, not elsewhere classified: I96

## 2017-05-09 LAB — BASIC METABOLIC PANEL
Anion gap: 13 (ref 5–15)
BUN: 25 mg/dL — AB (ref 6–20)
CHLORIDE: 98 mmol/L — AB (ref 101–111)
CO2: 26 mmol/L (ref 22–32)
CREATININE: 9.13 mg/dL — AB (ref 0.61–1.24)
Calcium: 8.9 mg/dL (ref 8.9–10.3)
GFR calc Af Amer: 6 mL/min — ABNORMAL LOW (ref >60)
GFR calc non Af Amer: 5 mL/min — ABNORMAL LOW (ref >60)
GLUCOSE: 81 mg/dL (ref 65–99)
Potassium: 4.1 mmol/L (ref 3.5–5.1)
Sodium: 137 mmol/L (ref 135–145)

## 2017-05-09 LAB — CBC
HCT: 36 % — ABNORMAL LOW (ref 39.0–52.0)
Hemoglobin: 11.3 g/dL — ABNORMAL LOW (ref 13.0–17.0)
MCH: 20.1 pg — AB (ref 26.0–34.0)
MCHC: 31.4 g/dL (ref 30.0–36.0)
MCV: 64.1 fL — AB (ref 78.0–100.0)
PLATELETS: 220 10*3/uL (ref 150–400)
RBC: 5.62 MIL/uL (ref 4.22–5.81)
RDW: 17.9 % — AB (ref 11.5–15.5)
WBC: 8.3 10*3/uL (ref 4.0–10.5)

## 2017-05-09 SURGERY — AMPUTATION, FOOT, PARTIAL
Anesthesia: General | Site: Foot | Laterality: Right

## 2017-05-09 MED ORDER — SUGAMMADEX SODIUM 200 MG/2ML IV SOLN
INTRAVENOUS | Status: AC
  Filled 2017-05-09: qty 2

## 2017-05-09 MED ORDER — LIDOCAINE HCL (CARDIAC) 20 MG/ML IV SOLN
INTRAVENOUS | Status: DC | PRN
  Administered 2017-05-09: 50 mg via INTRAVENOUS

## 2017-05-09 MED ORDER — METHOCARBAMOL 500 MG PO TABS
500.0000 mg | ORAL_TABLET | Freq: Four times a day (QID) | ORAL | Status: DC | PRN
  Administered 2017-05-10: 500 mg via ORAL
  Filled 2017-05-09: qty 1

## 2017-05-09 MED ORDER — SEVELAMER CARBONATE 800 MG PO TABS: 1600.0000 mg | ORAL_TABLET | ORAL | Status: DC | PRN

## 2017-05-09 MED ORDER — HYDROCODONE-ACETAMINOPHEN 5-325 MG PO TABS: 1.0000 | ORAL_TABLET | ORAL | 0 refills | Status: DC | PRN

## 2017-05-09 MED ORDER — BISACODYL 10 MG RE SUPP: 10.0000 mg | Freq: Every day | RECTAL | Status: DC | PRN

## 2017-05-09 MED ORDER — ROCURONIUM BROMIDE 10 MG/ML (PF) SYRINGE
PREFILLED_SYRINGE | INTRAVENOUS | Status: AC
  Filled 2017-05-09: qty 5

## 2017-05-09 MED ORDER — HYDROMORPHONE HCL 1 MG/ML IJ SOLN: 1.0000 mg | INTRAMUSCULAR | Status: DC | PRN

## 2017-05-09 MED ORDER — CEFAZOLIN SODIUM-DEXTROSE 1-4 GM/50ML-% IV SOLN
1.0000 g | Freq: Four times a day (QID) | INTRAVENOUS | Status: DC
  Filled 2017-05-09 (×2): qty 50

## 2017-05-09 MED ORDER — PROPOFOL 10 MG/ML IV BOLUS
INTRAVENOUS | Status: DC | PRN
  Administered 2017-05-09: 150 mg via INTRAVENOUS

## 2017-05-09 MED ORDER — ONDANSETRON HCL 4 MG/2ML IJ SOLN: 4.0000 mg | Freq: Once | INTRAMUSCULAR | Status: DC | PRN

## 2017-05-09 MED ORDER — ACETAMINOPHEN 325 MG PO TABS: 650.0000 mg | ORAL_TABLET | ORAL | Status: DC | PRN

## 2017-05-09 MED ORDER — CINACALCET HCL 30 MG PO TABS
60.0000 mg | ORAL_TABLET | Freq: Every evening | ORAL | Status: DC
  Administered 2017-05-09 – 2017-05-12 (×4): 60 mg via ORAL
  Filled 2017-05-09 (×4): qty 2

## 2017-05-09 MED ORDER — POLYETHYLENE GLYCOL 3350 17 G PO PACK: 17.0000 g | PACK | Freq: Every day | ORAL | Status: DC | PRN

## 2017-05-09 MED ORDER — OXYCODONE HCL 5 MG PO TABS: 5.0000 mg | ORAL_TABLET | Freq: Once | ORAL | Status: DC | PRN

## 2017-05-09 MED ORDER — FENTANYL CITRATE (PF) 100 MCG/2ML IJ SOLN
INTRAMUSCULAR | Status: DC | PRN
  Administered 2017-05-09: 75 ug via INTRAVENOUS

## 2017-05-09 MED ORDER — CHLORHEXIDINE GLUCONATE 4 % EX LIQD: 60.0000 mL | Freq: Once | CUTANEOUS | Status: DC

## 2017-05-09 MED ORDER — FENTANYL CITRATE (PF) 100 MCG/2ML IJ SOLN: 25.0000 ug | INTRAMUSCULAR | Status: DC | PRN

## 2017-05-09 MED ORDER — CEFAZOLIN SODIUM-DEXTROSE 2-4 GM/100ML-% IV SOLN
2.0000 g | INTRAVENOUS | Status: AC
  Administered 2017-05-09: 2 g via INTRAVENOUS
  Filled 2017-05-09: qty 100

## 2017-05-09 MED ORDER — METOCLOPRAMIDE HCL 5 MG/ML IJ SOLN: 5.0000 mg | Freq: Three times a day (TID) | INTRAMUSCULAR | Status: DC | PRN

## 2017-05-09 MED ORDER — HYDROCODONE-ACETAMINOPHEN 5-325 MG PO TABS: 1.0000 | ORAL_TABLET | ORAL | Status: DC | PRN

## 2017-05-09 MED ORDER — ONDANSETRON HCL 4 MG PO TABS: 4.0000 mg | ORAL_TABLET | Freq: Four times a day (QID) | ORAL | Status: DC | PRN

## 2017-05-09 MED ORDER — ASPIRIN EC 325 MG PO TBEC
325.0000 mg | DELAYED_RELEASE_TABLET | Freq: Every day | ORAL | Status: DC
  Filled 2017-05-09: qty 1

## 2017-05-09 MED ORDER — EPHEDRINE 5 MG/ML INJ
INTRAVENOUS | Status: AC
  Filled 2017-05-09: qty 10

## 2017-05-09 MED ORDER — OXYCODONE HCL 5 MG/5ML PO SOLN: 5.0000 mg | Freq: Once | ORAL | Status: DC | PRN

## 2017-05-09 MED ORDER — ATORVASTATIN CALCIUM 40 MG PO TABS
40.0000 mg | ORAL_TABLET | Freq: Every day | ORAL | Status: DC
  Administered 2017-05-09 – 2017-05-13 (×5): 40 mg via ORAL
  Filled 2017-05-09 (×5): qty 1

## 2017-05-09 MED ORDER — ONDANSETRON HCL 4 MG/2ML IJ SOLN
INTRAMUSCULAR | Status: DC | PRN
  Administered 2017-05-09: 4 mg via INTRAVENOUS

## 2017-05-09 MED ORDER — 0.9 % SODIUM CHLORIDE (POUR BTL) OPTIME
TOPICAL | Status: DC | PRN
  Administered 2017-05-09: 1000 mL

## 2017-05-09 MED ORDER — SEVELAMER CARBONATE 800 MG PO TABS
2400.0000 mg | ORAL_TABLET | Freq: Three times a day (TID) | ORAL | Status: DC
  Administered 2017-05-09 – 2017-05-13 (×8): 2400 mg via ORAL
  Filled 2017-05-09 (×8): qty 3

## 2017-05-09 MED ORDER — PHENYLEPHRINE 40 MCG/ML (10ML) SYRINGE FOR IV PUSH (FOR BLOOD PRESSURE SUPPORT)
PREFILLED_SYRINGE | INTRAVENOUS | Status: AC
  Filled 2017-05-09: qty 10

## 2017-05-09 MED ORDER — SODIUM CHLORIDE 0.9 % IV SOLN
INTRAVENOUS | Status: DC
  Administered 2017-05-09: 12:00:00 via INTRAVENOUS

## 2017-05-09 MED ORDER — OXYCODONE HCL 5 MG PO TABS
10.0000 mg | ORAL_TABLET | ORAL | Status: DC | PRN
  Administered 2017-05-09 – 2017-05-12 (×5): 10 mg via ORAL
  Filled 2017-05-09 (×4): qty 2

## 2017-05-09 MED ORDER — ACETAMINOPHEN 650 MG RE SUPP: 650.0000 mg | RECTAL | Status: DC | PRN

## 2017-05-09 MED ORDER — CEFAZOLIN SODIUM-DEXTROSE 1-4 GM/50ML-% IV SOLN
1.0000 g | Freq: Once | INTRAVENOUS | Status: AC
  Administered 2017-05-09: 1 g via INTRAVENOUS
  Filled 2017-05-09: qty 50

## 2017-05-09 MED ORDER — DOCUSATE SODIUM 100 MG PO CAPS
100.0000 mg | ORAL_CAPSULE | Freq: Two times a day (BID) | ORAL | Status: DC
  Administered 2017-05-09 – 2017-05-13 (×6): 100 mg via ORAL
  Filled 2017-05-09 (×6): qty 1

## 2017-05-09 MED ORDER — PHENYLEPHRINE HCL 10 MG/ML IJ SOLN
INTRAMUSCULAR | Status: DC | PRN
  Administered 2017-05-09 (×2): 120 ug via INTRAVENOUS

## 2017-05-09 MED ORDER — HEPARIN SODIUM (PORCINE) 1000 UNIT/ML IJ SOLN
1000.0000 [IU] | Freq: Once | INTRAMUSCULAR | Status: AC
  Administered 2017-05-09: 2.5 mL via INTRAVENOUS

## 2017-05-09 MED ORDER — ALPRAZOLAM 0.5 MG PO TABS
0.5000 mg | ORAL_TABLET | Freq: Every day | ORAL | Status: DC
  Administered 2017-05-09 – 2017-05-13 (×5): 0.5 mg via ORAL
  Filled 2017-05-09 (×5): qty 1

## 2017-05-09 MED ORDER — FENTANYL CITRATE (PF) 250 MCG/5ML IJ SOLN
INTRAMUSCULAR | Status: AC
  Filled 2017-05-09: qty 5

## 2017-05-09 MED ORDER — ONDANSETRON HCL 4 MG/2ML IJ SOLN: 4.0000 mg | Freq: Four times a day (QID) | INTRAMUSCULAR | Status: DC | PRN

## 2017-05-09 MED ORDER — SODIUM CHLORIDE 0.9 % IV SOLN: INTRAVENOUS | Status: DC

## 2017-05-09 MED ORDER — METHOCARBAMOL 1000 MG/10ML IJ SOLN
500.0000 mg | Freq: Four times a day (QID) | INTRAVENOUS | Status: DC | PRN
  Filled 2017-05-09: qty 5

## 2017-05-09 MED ORDER — ONDANSETRON HCL 4 MG/2ML IJ SOLN
INTRAMUSCULAR | Status: AC
  Filled 2017-05-09: qty 2

## 2017-05-09 MED ORDER — DEXAMETHASONE SODIUM PHOSPHATE 10 MG/ML IJ SOLN
INTRAMUSCULAR | Status: AC
  Filled 2017-05-09: qty 1

## 2017-05-09 MED ORDER — LIDOCAINE 2% (20 MG/ML) 5 ML SYRINGE
INTRAMUSCULAR | Status: AC
  Filled 2017-05-09: qty 5

## 2017-05-09 MED ORDER — MAGNESIUM CITRATE PO SOLN: 1.0000 | Freq: Once | ORAL | Status: DC | PRN

## 2017-05-09 MED ORDER — SUCCINYLCHOLINE CHLORIDE 200 MG/10ML IV SOSY
PREFILLED_SYRINGE | INTRAVENOUS | Status: AC
  Filled 2017-05-09: qty 10

## 2017-05-09 MED ORDER — METOCLOPRAMIDE HCL 5 MG PO TABS: 5.0000 mg | ORAL_TABLET | Freq: Three times a day (TID) | ORAL | Status: DC | PRN

## 2017-05-09 SURGICAL SUPPLY — 37 items
APL SKNCLS STERI-STRIP NONHPOA (GAUZE/BANDAGES/DRESSINGS) ×1
BENZOIN TINCTURE PRP APPL 2/3 (GAUZE/BANDAGES/DRESSINGS) ×3 IMPLANT
BLADE SAW SGTL HD 18.5X60.5X1. (BLADE) ×3 IMPLANT
BLADE SURG 21 STRL SS (BLADE) ×3 IMPLANT
BNDG COHESIVE 4X5 TAN STRL (GAUZE/BANDAGES/DRESSINGS) IMPLANT
BNDG GAUZE ELAST 4 BULKY (GAUZE/BANDAGES/DRESSINGS) IMPLANT
COVER SURGICAL LIGHT HANDLE (MISCELLANEOUS) ×3 IMPLANT
DRAPE INCISE IOBAN 66X45 STRL (DRAPES) ×3 IMPLANT
DRAPE U-SHAPE 47X51 STRL (DRAPES) ×3 IMPLANT
DRESSING PREVENA PLUS CUSTOM (GAUZE/BANDAGES/DRESSINGS) IMPLANT
DRSG ADAPTIC 3X8 NADH LF (GAUZE/BANDAGES/DRESSINGS) IMPLANT
DRSG PAD ABDOMINAL 8X10 ST (GAUZE/BANDAGES/DRESSINGS) IMPLANT
DRSG PREVENA PLUS CUSTOM (GAUZE/BANDAGES/DRESSINGS) ×6
DURAPREP 26ML APPLICATOR (WOUND CARE) ×3 IMPLANT
ELECT REM PT RETURN 9FT ADLT (ELECTROSURGICAL) ×3
ELECTRODE REM PT RTRN 9FT ADLT (ELECTROSURGICAL) ×1 IMPLANT
GAUZE SPONGE 4X4 12PLY STRL (GAUZE/BANDAGES/DRESSINGS) IMPLANT
GLOVE BIOGEL PI IND STRL 9 (GLOVE) ×1 IMPLANT
GLOVE BIOGEL PI INDICATOR 9 (GLOVE) ×2
GLOVE SURG ORTHO 9.0 STRL STRW (GLOVE) ×3 IMPLANT
GOWN STRL REUS W/ TWL XL LVL3 (GOWN DISPOSABLE) ×3 IMPLANT
GOWN STRL REUS W/TWL XL LVL3 (GOWN DISPOSABLE) ×9
KIT BASIN OR (CUSTOM PROCEDURE TRAY) ×3 IMPLANT
KIT PREVENA INCISION MGT 13 (CANNISTER) ×2 IMPLANT
KIT ROOM TURNOVER OR (KITS) ×3 IMPLANT
NS IRRIG 1000ML POUR BTL (IV SOLUTION) ×3 IMPLANT
PACK ORTHO EXTREMITY (CUSTOM PROCEDURE TRAY) ×3 IMPLANT
PAD ARMBOARD 7.5X6 YLW CONV (MISCELLANEOUS) ×6 IMPLANT
SPONGE LAP 18X18 X RAY DECT (DISPOSABLE) IMPLANT
SUT ETHILON 2 0 PSLX (SUTURE) ×6 IMPLANT
SUT VIC AB 2-0 CTB1 (SUTURE) IMPLANT
TOWEL OR 17X24 6PK STRL BLUE (TOWEL DISPOSABLE) ×3 IMPLANT
TOWEL OR 17X26 10 PK STRL BLUE (TOWEL DISPOSABLE) ×3 IMPLANT
TUBE CONNECTING 12'X1/4 (SUCTIONS) ×1
TUBE CONNECTING 12X1/4 (SUCTIONS) ×2 IMPLANT
WATER STERILE IRR 1000ML POUR (IV SOLUTION) ×3 IMPLANT
YANKAUER SUCT BULB TIP NO VENT (SUCTIONS) ×3 IMPLANT

## 2017-05-09 NOTE — H&P (Signed)
Joseph BERA Sr. is an 77 y.o. male.   Chief Complaint: Gangrenous changes of the right forearm.  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  Past Medical History:  Diagnosis Date  . Anxiety   . Aortic stenosis   . Arthritis   . CVA (cerebral infarction)   . ESRD (end stage renal disease) on dialysis [facility])    M/W/F at [facility] in [place]  . Essential hypertension   . Gangrene of right foot ([place])   . Gastric ulcer 2004  . History of cardiomyopathy    LVEF normal as of February 2017  . History of stroke    Left side weakness  . Iron deficiency anemia     Past Surgical History:  Procedure Laterality Date  . ABDOMINAL AORTAGRAM N/A 01/24/2012   Procedure: ABDOMINAL Maxcine Ham;  Surgeon: Elam Dutch, MD;  Location: [facility] CATH LAB;  Service: Cardiovascular;  Laterality: N/A;  . ABDOMINAL AORTOGRAM W/LOWER EXTREMITY N/A 04/15/2017   Procedure: ABDOMINAL AORTOGRAM W/LOWER EXTREMITY;  Surgeon: Angelia Mould, MD;  Location: [facility] CV LAB;  Service: Cardiovascular;  Laterality: N/A;  . ARTERIOVENOUS GRAFT PLACEMENT Right right arm  . AV FISTULA PLACEMENT Left 08/31/2015   Procedure: ARTERIOVENOUS (AV) FISTULA CREATION- LEFT RADIOCEPHALIC;  Surgeon: Mal Misty, MD;  Location: [place];  Service: Vascular;  Laterality: Left;  . AV FISTULA PLACEMENT Right 02/18/2017   Procedure: INSERTION OF ARTERIOVENOUS (AV) GORE-TEX GRAFT  RIGHT UPPER ARM;  Surgeon: Conrad Gayville, MD;  Location: [facility];  Service: Vascular;  Laterality: Right;  . BASCILIC VEIN TRANSPOSITION Left 12/19/2015   Procedure: FIRST STAGE BRACHIAL VEIN TRANSPOSITION;  Surgeon: Conrad Tonyville, MD;  Location: [facility];  Service: Vascular;  Laterality: Left;  . BASCILIC VEIN TRANSPOSITION Left 02/08/2016   Procedure: SECOND STAGE BRACHIAL VEIN TRANSPOSITION;  Surgeon: Conrad , MD;  Location: [place] [facility];  Service: Vascular;  Laterality: Left;  . CARDIAC CATHETERIZATION N/A 07/11/2015   Procedure: Left Heart Cath and Coronary Angiography;  Surgeon: Troy Sine, MD;  Location: [place] CV LAB;  Service: Cardiovascular;  Laterality: N/A;  . Carpel Tunnel Left Dec. 22, 2016  . CHOLECYSTECTOMY    . COLONOSCOPY  2004   Dr. Irving Shows, left sided diverticula and cecal polyp, path unknown  . COLONOSCOPY  10/29/2011   Procedure: COLONOSCOPY;  Surgeon: Daneil Dolin, MD;  [facility];  Service: Endoscopy;  Laterality: N/A;  10:15  . ESOPHAGOGASTRODUODENOSCOPY  11/2002   Dr. Gala Romney, erosive reflux esophagitis, multiple gastric ulcer and antral/bulbar erosions. Serologies positive for H.Pylori and was treated  . ESOPHAGOGASTRODUODENOSCOPY  11/20014   Dr. Gala Romney, small hh only, ulcers healed  . ESOPHAGOGASTRODUODENOSCOPY  09/21/2011   Dr Trevor Iha HH, antral erosions, ?early GAVE  . FISTULOGRAM Left 12/10/2016   Procedure: THROMBECTOMY OF LEFT ARM ARTERIOVENOUS FISTULA;  Surgeon: Waynetta Sandy, MD;  Location: [place];  Service: Vascular;  Laterality: Left;  . INSERTION OF DIALYSIS CATHETER Left 12/10/2016   Procedure: INSERTION OF TUNNELED DIALYSIS CATHETER;  Surgeon: Waynetta Sandy, MD;  Location: [place];  Service: Vascular;  Laterality: Left;  . IR GENERIC HISTORICAL  07/16/2016   IR REMOVAL TUN CV CATH W/O FL 07/16/2016 Saverio Danker, PA-C [facility]  . LIGATION OF ARTERIOVENOUS  FISTULA Left 12/19/2015   Procedure: LIGATION OF RADIOCEPHALIC ARTERIOVENOUS  FISTULA;  Surgeon: Conrad Franklin, MD;  Location: [facility];  Service: Vascular;  Laterality: Left;  Marland Kitchen MASS EXCISION Right 02/18/2017   Procedure: EXCISION OF RIGHT AXILLARY EPIDERMAL INCLUSION CYST;  Surgeon: Conrad Northbrook, MD;  Location: [place];  Service: Vascular;  Laterality: Right;  . PERIPHERAL VASCULAR CATHETERIZATION N/A 12/14/2015   Procedure: Fistulagram;  Surgeon: Conrad Togiak, MD;   Location: [place] CV LAB;  Service: Cardiovascular;  Laterality: N/A;  . SHOULDER SURGERY    . ULTRASOUND GUIDANCE FOR VASCULAR ACCESS  04/15/2017   Procedure: Ultrasound Guidance For Vascular Access;  Surgeon: Angelia Mould, MD;  Location: [place] CV LAB;  Service: Cardiovascular;;  . UPPER EXTREMITY VENOGRAPHY Bilateral 12/17/2016   Procedure: Bilateral Upper Extremity Venography;  Surgeon: Serafina Mitchell, MD;  Location: [place] CV LAB;  Service: Cardiovascular;  Laterality: Bilateral;    Family History  Problem Relation Age of Onset  . Hypertension Mother   . Colon cancer Neg Hx   . Liver disease Neg Hx    Social History:  reports that he quit smoking about 13 years ago. He quit smokeless tobacco use about 30 years ago. His smokeless tobacco use included chew. He reports that he does not drink alcohol or use drugs.  Allergies:  Allergies  Allergen Reactions  . Aspirin Other (See Comments)    INTERNAL BLEEDING    Medications Prior to Admission  Medication Sig Dispense Refill  . ALPRAZolam (XANAX) 0.5 MG tablet Take 0.5 mg by mouth daily.  3  . atorvastatin (LIPITOR) 40 MG tablet Take 40 mg by mouth daily.  3  . b complex-vitamin c-folic acid (NEPHRO-VITE) 0.8 MG TABS Take 0.8 mg by mouth See admin instructions. Takes on Tuesdays, Thursdays, Saturdays, and Sundays. Does not take on Mondays, Wednesdays, and Fridays due to Dialysis treatments.    . cinacalcet (SENSIPAR) 60 MG tablet Take 60 mg by mouth every evening. With evening meal    . meclizine (ANTIVERT) 25 MG tablet Take 25 mg by mouth daily as needed for dizziness (Non dialysis days).     . ondansetron (ZOFRAN) 8 MG tablet Take 1 tablet (8 mg total) by mouth every 8 (eight) hours as needed for nausea or vomiting. 20 tablet 0  . promethazine (PHENERGAN) 25 MG tablet Take 25 mg by mouth every 8 (eight) hours as needed for nausea or vomiting.     . sevelamer (RENVELA) 800 MG tablet Take 1,600-2,400 mg by  mouth See admin instructions. Take 2400  mg by mouth 3 times daily with full meals and take 1600 mg by mouth with snacks.      Results for orders placed or performed during the hospital encounter of 05/09/17 (from the past 48 hour(s))  I-STAT 4, (NA,K, GLUC, HGB,HCT)     Status: Abnormal   Collection Time: 05/09/17  8:35 AM  Result Value Ref Range   Sodium 133 (L) 135 - 145 mmol/L   Potassium 8.1 (HH) 3.5 - 5.1 mmol/L   Glucose, Bld 93 65 - 99 mg/dL   HCT 38.0 (L) 39.0 - 52.0 %   Hemoglobin 12.9 (L) 13.0 - 17.0 g/dL   Comment NOTIFIED PHYSICIAN    No results found.  Review of Systems  All other systems reviewed and are negative.   Blood pressure (!) 144/83, pulse 95, temperature 97.9 F (36.6 C), temperature source Axillary, resp. rate 18, height 5\' 5"  (1.651 m), weight 175 lb (79.4 kg), SpO2 100 %. Physical Exam  Examination patient is  alert oriented no adenopathy well-dressed normal affect normal respiratory effort He does have a dopplerable dorsalis pedis pulse biphasic.  He has ischemic gangrenous changes to the right forefoot. Assessment/Plan  Assessment: End-stage renal disease on dialysis with peripheral vascular disease with gangrene of the right forefoot.  Plan: We will plan for right transmetatarsal amputation.  Risks and benefits were discussed including risk of the wound not healing.  Patient states he understands wished to proceed at this time.  Plan for dialysis postoperatively.  [name], MD 05/09/2017, 9:26 AM

## 2017-05-09 NOTE — Anesthesia Postprocedure Evaluation (Signed)
Anesthesia Post Note  Patient: RENNE PLATTS Sr.  Procedure(s) Performed: RIGHT TRANSMETATARSAL AMPUTATION (Right Foot)     Patient location during evaluation: PACU Anesthesia Type: General Level of consciousness: awake and alert Pain management: pain level controlled Vital Signs Assessment: post-procedure vital signs reviewed and stable Respiratory status: spontaneous breathing, nonlabored ventilation, respiratory function stable and patient connected to nasal cannula oxygen Cardiovascular status: blood pressure returned to baseline and stable Postop Assessment: no apparent nausea or vomiting Anesthetic complications: no    Last Vitals:  Vitals:   05/09/17 1400 05/09/17 1419  BP:  (!) 164/87  Pulse: 82 88  Resp: 16 16  Temp: (!) 36.2 C   SpO2: 97% 96%    Last Pain:  Vitals:   05/09/17 0817  TempSrc: Axillary                 [name]

## 2017-05-09 NOTE — Evaluation (Signed)
Physical Therapy Evaluation Patient Details Name: Joseph ENCK Sr. MRN: [id number] DOB: 08/02/1940 Today's Date: 05/09/2017   History of Present Illness  77 y.o. male s/p right transmetatarsal amputation 1/11. PMH includes: ESRD on dialysis, CVA, aortic stenosis, HTN, Anxiety, Cardiomyopathy, shoulder surgery.     Clinical Impression  Patient is s/p above surgery resulting in functional limitations due to the deficits listed below (see PT Problem List). PTA, pt living alone ambulating without AD and driving. Patients adult children live across street, but patient wishes to return to cousins house who is avail 24/7 for support/supervision with level entry to her home. Upon eval, patient presents with mild post op pain and weakness in RLE that limit his mobility. Pt also demonstrating poor adherence to TDWB status at this time (see General Ambulation/Gait Comments section below). Next visit will focus on gait training, recommending HHPT to work with patient to ensure cary over and safety upon d/c once medically ready.  Patient will benefit from skilled PT to increase their independence and safety with mobility to allow discharge to the venue listed below.       Follow Up Recommendations Home health PT;Supervision for mobility/OOB    Equipment Recommendations  Rolling walker with 5" wheels    Recommendations for Other Services       Precautions / Restrictions Precautions Precautions: Fall Restrictions Weight Bearing Restrictions: Yes RLE Weight Bearing: Touchdown weight bearing      Mobility  Bed Mobility Overal bed mobility: Needs Assistance Bed Mobility: Supine to Sit     Supine to sit: Supervision     General bed mobility comments: Supervision for safety  Transfers Overall transfer level: Needs assistance Equipment used: Rolling walker (2 wheeled) Transfers: Sit to/from Stand Sit to Stand: Min guard         General transfer comment: Min guard for safety, cues for hand  placement with RW and WB status  Ambulation/Gait Ambulation/Gait assistance: Min guard Ambulation Distance (Feet): 100 Feet Assistive device: Rolling walker (2 wheeled) Gait Pattern/deviations: Step-to pattern;Antalgic Gait velocity: decreased   General Gait Details: Mulitomodal cueing for TDWB status and techinque with RW. Patient unable to avoid PWB through heel during ambulation despite attempts to correct and edcuate new mechanics. Patient unwilling to avoid, family educated and understands TDWB status clearly. Several trials some better than others but ulimately unable to follow consistently  Stairs            Wheelchair Mobility    Modified Rankin (Stroke Patients Only)       Balance Overall balance assessment: Needs assistance Sitting-balance support: Feet unsupported;No upper extremity supported Sitting balance-Leahy Scale: Normal     Standing balance support: During functional activity;Bilateral upper extremity supported Standing balance-Leahy Scale: Fair Standing balance comment: safer with BUE support at this time                             Pertinent Vitals/Pain Pain Assessment: Faces Faces Pain Scale: Hurts little more Pain Location: R foot Pain Descriptors / Indicators: Discomfort;Grimacing Pain Intervention(s): Limited activity within patient's tolerance;Monitored during session;Premedicated before session    Home Living Family/patient expects to be discharged to:: Private residence Living Arrangements: Alone               Additional Comments: Patient plans to d/c to cousins home who is avail 24 hour support. entry level with bath tub.     Prior Function Level of Independence: Independent  Comments: ambulating without AD and driving to HD      Hand Dominance        Extremity/Trunk Assessment   Upper Extremity Assessment Upper Extremity Assessment: Overall WFL for tasks assessed    Lower Extremity  Assessment Lower Extremity Assessment: Overall WFL for tasks assessed       Communication   Communication: No difficulties  Cognition Arousal/Alertness: Awake/alert Behavior During Therapy: WFL for tasks assessed/performed Overall Cognitive Status: Within Functional Limits for tasks assessed                                        General Comments General comments (skin integrity, edema, etc.): Edcuated family on TDWB and discussed support/supervision for d/c home.    Exercises General Exercises - Lower Extremity Ankle Circles/Pumps: AROM;Left;10 reps   Assessment/Plan    PT Assessment Patient needs continued PT services  PT Problem List Decreased strength;Decreased range of motion;Decreased activity tolerance;Decreased balance;Decreased mobility;Decreased knowledge of use of DME;Decreased safety awareness;Pain       PT Treatment Interventions DME instruction;Gait training;Stair training;Functional mobility training;Therapeutic activities;Therapeutic exercise    PT Goals (Current goals can be found in the Care Plan section)  Acute Rehab PT Goals Patient Stated Goal: return to cousins house PT Goal Formulation: With patient/family Time For Goal Achievement: 05/16/17 Potential to Achieve Goals: Good    Frequency Min 5X/week   Barriers to discharge        Co-evaluation               AM-PAC PT "6 Clicks" Daily Activity  Outcome Measure Difficulty turning over in bed (including adjusting bedclothes, sheets and blankets)?: A Little Difficulty moving from lying on back to sitting on the side of the bed? : A Little Difficulty sitting down on and standing up from a chair with arms (e.g., wheelchair, bedside commode, etc,.)?: A Little Help needed moving to and from a bed to chair (including a wheelchair)?: A Little Help needed walking in hospital room?: A Little Help needed climbing 3-5 steps with a railing? : A Little 6 Click Score: 18    End of  Session Equipment Utilized During Treatment: Gait belt Activity Tolerance: Patient tolerated treatment well Patient left: in chair;with call bell/phone within reach;with nursing/sitter in room;with family/visitor present Nurse Communication: Mobility status PT Visit Diagnosis: Unsteadiness on feet (R26.81);Other abnormalities of gait and mobility (R26.89);Pain;Difficulty in walking, not elsewhere classified (R26.2) Pain - Right/Left: Right Pain - part of body: Ankle and joints of foot    Time: 1700-1730 PT Time Calculation (min) (ACUTE ONLY): 30 min   Charges:   PT Evaluation $PT Eval Low Complexity: 1 Low PT Treatments $Gait Training: 8-22 mins   PT G Codes:       Reinaldo Berber, PT, DPT Acute Rehab Services Pager: [phone]   Reinaldo Berber 05/09/2017, 5:42 PM

## 2017-05-09 NOTE — Anesthesia Postprocedure Evaluation (Signed)
Anesthesia Post Note  Patient: Joseph GARRY Sr.  Procedure(s) Performed: RIGHT TRANSMETATARSAL AMPUTATION (Right Foot)     Patient location during evaluation: PACU Anesthesia Type: General Level of consciousness: awake and alert Pain management: pain level controlled Vital Signs Assessment: post-procedure vital signs reviewed and stable Respiratory status: spontaneous breathing, nonlabored ventilation, respiratory function stable and patient connected to nasal cannula oxygen Cardiovascular status: blood pressure returned to baseline and stable Postop Assessment: no apparent nausea or vomiting Anesthetic complications: no    Last Vitals:  Vitals:   05/09/17 1400 05/09/17 1419  BP:  (!) 164/87  Pulse: 82 88  Resp: 16 16  Temp: (!) 36.2 C   SpO2: 97% 96%    Last Pain:  Vitals:   05/09/17 0817  TempSrc: Axillary                 Milly Goggins COKER

## 2017-05-09 NOTE — Op Note (Signed)
05/09/2017  12:42 PM  PATIENT:  Joseph Maryland Sr.    PRE-OPERATIVE DIAGNOSIS:  Gangrene Right Foot  POST-OPERATIVE DIAGNOSIS:  Same  PROCEDURE:  RIGHT TRANSMETATARSAL AMPUTATION application of Praveena wound VAC.  SURGEON:  [name], MD  PHYSICIAN ASSISTANT:None ANESTHESIA:   General  PREOPERATIVE INDICATIONS:  Joseph DEMAN Sr. is a  77 y.o. male with a diagnosis of Gangrene Right Foot who failed conservative measures and elected for surgical management.    The risks benefits and alternatives were discussed with the patient preoperatively including but not limited to the risks of infection, bleeding, nerve injury, cardiopulmonary complications, the need for revision surgery, among others, and the patient was willing to proceed.  OPERATIVE IMPLANTS: Praveena wound VAC  OPERATIVE FINDINGS: Good petechial bleeding  OPERATIVE PROCEDURE: Patient was brought to the operating room and underwent a general anesthetic.  After adequate levels of anesthesia were obtained patient's right lower extremity was prepped using DuraPrep draped into a sterile field a timeout was called.  A fishmouth incision was made just proximal to the ulcerative tissue dorsally and plantarly.  A transmetatarsal amputation was performed with a oscillating saw.  Electrocautery was used for hemostasis.  The wound was irrigated with normal saline.  The incision was closed using 2-0 nylon there was no tension on the skin the wound edges approximated well and there was good petechial bleeding.  The wound was cleansed the skin was prepped using benzoin a Praveena wound VAC was applied this had a good suction fit patient was extubated taken to the PACU in stable condition.   DISCHARGE PLANNING:  Antibiotic duration: 24 hours  Weightbearing: Touchdown weightbearing on the right  Pain medication: Oral and IV pain medication Vicodin for discharge pain medication  Dressing care/ Wound VAC: Wound VAC to remain in place for 1  week  Ambulatory devices: Walker  Discharge to: Discharge to home, Saturday AM  Follow-up: In the office 1 week post operative.

## 2017-05-09 NOTE — Transfer of Care (Signed)
Immediate Anesthesia Transfer of Care Note  Patient: Joseph KLAS Sr.  Procedure(s) Performed: RIGHT TRANSMETATARSAL AMPUTATION (Right Foot)  Patient Location: PACU  Anesthesia Type:General  Level of Consciousness: awake, alert , oriented and patient cooperative  Airway & Oxygen Therapy: Patient Spontanous Breathing and Patient connected to nasal cannula oxygen  Post-op Assessment: Report given to RN and Post -op Vital signs reviewed and stable  Post vital signs: Reviewed and stable  Last Vitals:  Vitals:   05/09/17 1330 05/09/17 1349  BP:  (!) 141/85  Pulse: 82 76  Resp: 15 16  Temp:    SpO2: 99% 97%    Last Pain:  Vitals:   05/09/17 0817  TempSrc: Axillary      Patients Stated Pain Goal: 3 (78/24/23 5361)  Complications: No apparent anesthesia complications

## 2017-05-09 NOTE — Anesthesia Preprocedure Evaluation (Addendum)
Anesthesia Evaluation  Patient identified by MRN, date of birth, ID band Patient awake    Reviewed: Allergy & Precautions, NPO status , Patient's Chart, lab work & pertinent test results  Airway Mallampati: II  TM Distance: >3 FB Neck ROM: Full    Dental  (+) Edentulous Upper, Edentulous Lower   Pulmonary former smoker,    breath sounds clear to auscultation       Cardiovascular hypertension,  Rhythm:Regular     Neuro/Psych    GI/Hepatic   Endo/Other    Renal/GU      Musculoskeletal   Abdominal   Peds  Hematology   Anesthesia Other Findings   Reproductive/Obstetrics                            Anesthesia Physical Anesthesia Plan  ASA: III  Anesthesia Plan: MAC   Post-op Pain Management:    Induction:   PONV Risk Score and Plan: Ondansetron and Propofol infusion  Airway Management Planned: Natural Airway and Simple Face Mask  Additional Equipment:   Intra-op Plan:   Post-operative Plan:   Informed Consent: I have reviewed the patients History and Physical, chart, labs and discussed the procedure including the risks, benefits and alternatives for the proposed anesthesia with the patient or authorized representative who has indicated his/her understanding and acceptance.   Dental advisory given  Plan Discussed with: Anesthesiologist  Anesthesia Plan Comments:         Anesthesia Quick Evaluation

## 2017-05-09 NOTE — Progress Notes (Signed)
ISTAT resulted with potassium 8.1. Dr. Linna Caprice made aware. Lab to redraw sample.

## 2017-05-09 NOTE — Progress Notes (Signed)
Repeat Potassium 4.1

## 2017-05-09 NOTE — Progress Notes (Signed)
Orthopedic Tech Progress Note Patient Details:  Joseph CUMBIE Sr. 09/30/1940 [id number]  Ortho Devices Type of Ortho Device: Postop shoe/boot Ortho Device/Splint Location: rle Ortho Device/Splint Interventions: Application   Post Interventions Patient Tolerated: Well Instructions Provided: Care of device   Hildred Priest 05/09/2017, 2:08 PM

## 2017-05-09 NOTE — Anesthesia Procedure Notes (Signed)
Procedure Name: LMA Insertion Date/Time: 05/09/2017 12:24 PM Performed by: Shirlyn Goltz, CRNA Pre-anesthesia Checklist: Patient identified, Emergency Drugs available, Suction available and Patient being monitored Patient Re-evaluated:Patient Re-evaluated prior to induction Oxygen Delivery Method: Circle system utilized Preoxygenation: Pre-oxygenation with 100% oxygen Induction Type: IV induction Ventilation: Mask ventilation without difficulty LMA: LMA inserted LMA Size: 5.0 Number of attempts: 1 Placement Confirmation: positive ETCO2 and breath sounds checked- equal and bilateral Tube secured with: Tape Dental Injury: Teeth and Oropharynx as per pre-operative assessment

## 2017-05-10 ENCOUNTER — Encounter (HOSPITAL_COMMUNITY): Payer: Self-pay | Admitting: Orthopedic Surgery

## 2017-05-10 LAB — RENAL FUNCTION PANEL
Albumin: 3 g/dL — ABNORMAL LOW (ref 3.5–5.0)
Anion gap: 16 — ABNORMAL HIGH (ref 5–15)
BUN: 36 mg/dL — ABNORMAL HIGH (ref 6–20)
CHLORIDE: 97 mmol/L — AB (ref 101–111)
CO2: 23 mmol/L (ref 22–32)
CREATININE: 10.7 mg/dL — AB (ref 0.61–1.24)
Calcium: 8.7 mg/dL — ABNORMAL LOW (ref 8.9–10.3)
GFR calc non Af Amer: 4 mL/min — ABNORMAL LOW (ref >60)
GFR, EST AFRICAN AMERICAN: 5 mL/min — AB (ref >60)
Glucose, Bld: 83 mg/dL (ref 65–99)
Phosphorus: 5.4 mg/dL — ABNORMAL HIGH (ref 2.5–4.6)
Potassium: 3.5 mmol/L (ref 3.5–5.1)
Sodium: 136 mmol/L (ref 135–145)

## 2017-05-10 LAB — CBC
HCT: 33.9 % — ABNORMAL LOW (ref 39.0–52.0)
Hemoglobin: 11.3 g/dL — ABNORMAL LOW (ref 13.0–17.0)
MCH: 21.2 pg — AB (ref 26.0–34.0)
MCHC: 33.3 g/dL (ref 30.0–36.0)
MCV: 63.6 fL — AB (ref 78.0–100.0)
PLATELETS: 287 10*3/uL (ref 150–400)
RBC: 5.33 MIL/uL (ref 4.22–5.81)
RDW: 17.5 % — ABNORMAL HIGH (ref 11.5–15.5)
WBC: 10.1 10*3/uL (ref 4.0–10.5)

## 2017-05-10 LAB — HEPATITIS B SURFACE ANTIGEN: HEP B S AG: NEGATIVE

## 2017-05-10 MED ORDER — HEPARIN SODIUM (PORCINE) 1000 UNIT/ML DIALYSIS: 1000.0000 [IU] | INTRAMUSCULAR | Status: DC | PRN

## 2017-05-10 MED ORDER — LIDOCAINE-PRILOCAINE 2.5-2.5 % EX CREA: 1.0000 "application " | TOPICAL_CREAM | CUTANEOUS | Status: DC | PRN

## 2017-05-10 MED ORDER — LIDOCAINE HCL (PF) 1 % IJ SOLN: 5.0000 mL | INTRAMUSCULAR | Status: DC | PRN

## 2017-05-10 MED ORDER — ALTEPLASE 2 MG IJ SOLR: 2.0000 mg | Freq: Once | INTRAMUSCULAR | Status: DC | PRN

## 2017-05-10 MED ORDER — SODIUM CHLORIDE 0.9 % IV SOLN: 100.0000 mL | INTRAVENOUS | Status: DC | PRN

## 2017-05-10 MED ORDER — PENTAFLUOROPROP-TETRAFLUOROETH EX AERO: 1.0000 "application " | INHALATION_SPRAY | CUTANEOUS | Status: DC | PRN

## 2017-05-10 MED ORDER — OXYCODONE HCL 5 MG PO TABS
ORAL_TABLET | ORAL | Status: AC
  Filled 2017-05-10: qty 2

## 2017-05-10 NOTE — Progress Notes (Signed)
Physical Therapy Treatment Patient Details Name: Joseph BIDINGER Sr. MRN: [id number] DOB: 07/30/1940 Today's Date: 05/10/2017    History of Present Illness 77 y.o. male s/p right transmetatarsal amputation 1/11. PMH includes: ESRD on dialysis, CVA, aortic stenosis, HTN, Anxiety, Cardiomyopathy, shoulder surgery.     PT Comments    Patient tolerated gait training well. Pt overall min guard assist for mobility. Family present end of session. Current plan remains appropriate.    Follow Up Recommendations  Home health PT;Supervision for mobility/OOB     Equipment Recommendations  Rolling walker with 5" wheels    Recommendations for Other Services       Precautions / Restrictions Precautions Precautions: Fall Restrictions Weight Bearing Restrictions: Yes RLE Weight Bearing: Touchdown weight bearing    Mobility  Bed Mobility Overal bed mobility: Needs Assistance Bed Mobility: Sit to Supine       Sit to supine: Min guard   General bed mobility comments: min guard for safety and managing wound vac  Transfers Overall transfer level: Needs assistance Equipment used: Rolling walker (2 wheeled) Transfers: Sit to/from Stand Sit to Stand: Min guard         General transfer comment: cues for safe hand placement  Ambulation/Gait Ambulation/Gait assistance: Min guard Ambulation Distance (Feet): 100 Feet Assistive device: Rolling walker (2 wheeled) Gait Pattern/deviations: Step-through pattern;Decreased stance time - right;Decreased stride length;Trunk flexed Gait velocity: decreased   General Gait Details: cues for sequencing, posture, and maintaining TDWB; pt appears to be more PWB still despite education on techniques to offload weight on R LE   Stairs            Wheelchair Mobility    Modified Rankin (Stroke Patients Only)       Balance Overall balance assessment: Needs assistance Sitting-balance support: Feet unsupported;No upper extremity  supported Sitting balance-Leahy Scale: Normal     Standing balance support: During functional activity;Bilateral upper extremity supported Standing balance-Leahy Scale: Fair                              Cognition Arousal/Alertness: Awake/alert Behavior During Therapy: WFL for tasks assessed/performed Overall Cognitive Status: Within Functional Limits for tasks assessed                                        Exercises      General Comments General comments (skin integrity, edema, etc.): educated to deep R LE elevated when resting       Pertinent Vitals/Pain Pain Assessment: Faces Faces Pain Scale: Hurts a little bit Pain Location: R foot Pain Descriptors / Indicators: Discomfort Pain Intervention(s): Monitored during session;Repositioned    Home Living                      Prior Function            PT Goals (current goals can now be found in the care plan section) Acute Rehab PT Goals Patient Stated Goal: return to cousins house PT Goal Formulation: With patient/family Time For Goal Achievement: 05/16/17 Potential to Achieve Goals: Good Progress towards PT goals: Progressing toward goals    Frequency    Min 5X/week      PT Plan Current plan remains appropriate    Co-evaluation              AM-PAC PT "6  Clicks" Daily Activity  Outcome Measure  Difficulty turning over in bed (including adjusting bedclothes, sheets and blankets)?: A Little Difficulty moving from lying on back to sitting on the side of the bed? : A Little Difficulty sitting down on and standing up from a chair with arms (e.g., wheelchair, bedside commode, etc,.)?: A Little Help needed moving to and from a bed to chair (including a wheelchair)?: A Little Help needed walking in hospital room?: A Little Help needed climbing 3-5 steps with a railing? : A Little 6 Click Score: 18    End of Session Equipment Utilized During Treatment: Gait  belt Activity Tolerance: Patient tolerated treatment well Patient left: with call bell/phone within reach;with family/visitor present;in bed;with SCD's reapplied Nurse Communication: Mobility status PT Visit Diagnosis: Unsteadiness on feet (R26.81);Other abnormalities of gait and mobility (R26.89);Pain;Difficulty in walking, not elsewhere classified (R26.2) Pain - Right/Left: Right Pain - part of body: Ankle and joints of foot     Time: 2449-7530 PT Time Calculation (min) (ACUTE ONLY): 15 min  Charges:  $Gait Training: 8-22 mins                    G Codes:       Earney Navy, PTA Pager: [phone]     Darliss Cheney 05/10/2017, 2:15 PM

## 2017-05-10 NOTE — Progress Notes (Signed)
Subjective: 1 Day Post-Op Procedure(s) (LRB): RIGHT TRANSMETATARSAL AMPUTATION (Right) Patient reports pain as mild and moderate.    Objective: Vital signs in last 24 hours: Temp:  [97 F (36.1 C)-98.4 F (36.9 C)] 97.6 F (36.4 C) (01/12 1154) Pulse Rate:  [68-104] 89 (01/12 1154) Resp:  [15-18] 18 (01/12 1154) BP: (101-164)/(57-87) 121/64 (01/12 1154) SpO2:  [93 %-100 %] 98 % (01/12 1154) Weight:  [168 lb 14 oz (76.6 kg)-175 lb 14.8 oz (79.8 kg)] 168 lb 14 oz (76.6 kg) (01/12 1154)  Intake/Output from previous day: 01/11 0701 - 01/12 0700 In: 300 [I.V.:300] Out: -  Intake/Output this shift: Total I/O In: -  Out: 3000 [Other:3000]  Recent Labs    05/09/17 0835 05/09/17 0925 05/10/17 0803  HGB 12.9* 11.3* 11.3*   Recent Labs    05/09/17 0925 05/10/17 0803  WBC 8.3 10.1  RBC 5.62 5.33  HCT 36.0* 33.9*  PLT 220 287   Recent Labs    05/09/17 0925 05/10/17 0803  NA 137 136  K 4.1 3.5  CL 98* 97*  CO2 26 23  BUN 25* 36*  CREATININE 9.13* 10.70*  GLUCOSE 81 83  CALCIUM 8.9 8.7*   No results for input(s): LABPT, INR in the last 72 hours.  Incision: dressing C/D/I Wound VAC  Assessment/Plan: 1 Day Post-Op Procedure(s) (LRB): RIGHT TRANSMETATARSAL AMPUTATION (Right) Discharge to SNF  Biagio Borg., PA-C 05/10/2017, 12:57 PM

## 2017-05-10 NOTE — Progress Notes (Signed)
Physical Therapy Cancellation Note   05/10/17 1028  PT Visit Information  Last PT Received On 05/10/17  Reason Eval/Treat Not Completed Patient at procedure or test/unavailable. Pt in HD. PT will check on pt later as time allows.    Earney Navy, PTA Pager: [phone]

## 2017-05-10 NOTE — NC FL2 (Signed)
[place] LEVEL OF CARE SCREENING TOOL     IDENTIFICATION  Patient Name: Joseph STOGSDILL Sr. Birthdate: 04-18-1941 Sex: male Admission Date (Current Location): 05/09/2017  [facility] [place] Number:  Herbalist and Address:  [facility] [place] [address] [place], [place] [address]      Provider Number: [id number]  Attending Physician Name and Address:  [name], MD  Relative Name and Phone Number:       Current Level of Care: Hospital Recommended Level of Care: [place] Prior Approval Number:    Date Approved/Denied:   PASRR Number: [id number]  Discharge Plan: SNF    Current Diagnoses: Patient Active Problem List   Diagnosis Date Noted  . S/P transmetatarsal amputation of foot, right ([place]) 05/09/2017  . PVD (peripheral vascular disease) ([place]) 04/15/2017  . Nonischemic cardiomyopathy ([place]) 08/15/2015  . Aortic stenosis 08/15/2015  . Moderate aortic stenosis 07/12/2015  . 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 CAD- 40-50% LAD at cath 07/11/15 07/11/2015  . Aspirin intolerance 07/11/2015  . Abnormal stress test   . ESRD on dialysis ([place]) 05/30/2015  . CTS (carpal tunnel syndrome) 02/28/2015  . PVD of LE - Dr Oneida Alar follows 08/20/2012  . Ulcer of foot ([place]) 01/16/2012  . Encounter for screening colonoscopy 10/08/2011  . Melena 10/08/2011  . Other complications due to renal dialysis device, implant, and graft 09/24/2011    Orientation RESPIRATION BLADDER Height & Weight     Self, Time, Situation  Normal Continent Weight: 168 lb 14 oz (76.6 kg) Height:  5\' 5"  (165.1 cm)  BEHAVIORAL SYMPTOMS/MOOD NEUROLOGICAL BOWEL NUTRITION STATUS        (Please see d/c summary)  AMBULATORY STATUS COMMUNICATION OF NEEDS Skin   Limited Assist Verbally Surgical wounds(Right food, negative pressure wound therapy)                       Personal Care Assistance Level of Assistance  Dressing,  Bathing Bathing Assistance: Limited assistance   Dressing Assistance: Independent     Functional Limitations Info  Sight, Hearing, Speech Sight Info: Adequate Hearing Info: Adequate Speech Info: Adequate    SPECIAL CARE FACTORS FREQUENCY  PT (By licensed PT), OT (By licensed OT)     PT Frequency: 3x OT Frequency: 3x            Contractures Contractures Info: Not present    Additional Factors Info  Code Status, Allergies Code Status Info: Full Code Allergies Info: Aspirin           Current Medications (05/10/2017):  This is the current hospital active medication list Current Facility-Administered Medications  Medication Dose Route Frequency Provider Last Rate Last Dose  . 0.9 %  sodium chloride infusion   Intravenous Continuous [name], MD      . acetaminophen (TYLENOL) tablet 650 mg  650 mg Oral Q4H PRN [name], MD       Or  . acetaminophen (TYLENOL) suppository 650 mg  650 mg Rectal Q4H PRN [name], MD      . ALPRAZolam Duanne Moron) tablet 0.5 mg  0.5 mg Oral Daily [name], MD   0.5 mg at 05/10/17 1258  . aspirin EC tablet 325 mg  325 mg Oral QHS [name], MD      . atorvastatin (LIPITOR) tablet 40 mg  40 mg Oral Daily [name], MD   40  mg at 05/10/17 1257  . bisacodyl (DULCOLAX) suppository 10 mg  10 mg Rectal Daily PRN [name], MD      . cinacalcet [facility]) tablet 60 mg  60 mg Oral QPM [name], MD   60 mg at 05/09/17 1702  . docusate sodium (COLACE) capsule 100 mg  100 mg Oral BID [name], MD   100 mg at 05/10/17 1254  . HYDROcodone-acetaminophen (NORCO/VICODIN) 5-325 MG per tablet 1 tablet  1 tablet Oral Q4H PRN [name], MD      . HYDROmorphone (DILAUDID) injection 1 mg  1 mg Intravenous Q2H PRN [name], MD      . magnesium citrate solution 1 Bottle  1 Bottle Oral Once PRN [name], MD      . methocarbamol (ROBAXIN) tablet 500 mg  500 mg Oral Q6H PRN [name], MD       Or  .  methocarbamol (ROBAXIN) 500 mg in dextrose 5 % 50 mL IVPB  500 mg Intravenous Q6H PRN [name], MD      . metoCLOPramide (REGLAN) tablet 5-10 mg  5-10 mg Oral Q8H PRN [name], MD       Or  . metoCLOPramide (REGLAN) injection 5-10 mg  5-10 mg Intravenous Q8H PRN [name], MD      . ondansetron [facility]) tablet 4 mg  4 mg Oral Q6H PRN [name], MD       Or  . ondansetron [facility]) injection 4 mg  4 mg Intravenous Q6H PRN [name], MD      . oxyCODONE (Oxy IR/ROXICODONE) immediate release tablet 10 mg  10 mg Oral Q3H PRN [name], MD   10 mg at 05/10/17 9728  . polyethylene glycol (MIRALAX / GLYCOLAX) packet 17 g  17 g Oral Daily PRN [name], MD      . sevelamer carbonate (RENVELA) tablet 1,600 mg  1,600 mg Oral PRN [name], MD      . sevelamer carbonate (RENVELA) tablet 2,400 mg  2,400 mg Oral TID WC [name], MD   2,400 mg at 05/09/17 1611     Discharge Medications: Please see discharge summary for a list of discharge medications.  Relevant Imaging Results:  Relevant Lab Results:   Additional Information SSN: [id number]  Eileen Stanford, LCSW

## 2017-05-10 NOTE — Consult Note (Signed)
Referring Provider: No ref. provider found Primary Care Physician:  [name], MD Primary Nephrologist:    Reason for Consultation:   Dialysis management   HTN  Hyperkalemia  Anemia and secondary hyperparathyroidism  HPI:  77 year old man with ESRD  MWF dialysis [place]  Hypertension and history of CVA  Is admitted for elective surgical amputation of Right foot secondary to gangrene   Past Medical History:  Diagnosis Date  . Anxiety   . Aortic stenosis   . Arthritis   . CVA (cerebral infarction)   . ESRD (end stage renal disease) on dialysis [facility])    M/W/F at [facility] in [place]  . Essential hypertension   . Gangrene of right foot ([place])   . Gastric ulcer 2004  . History of cardiomyopathy    LVEF normal as of February 2017  . History of stroke    Left side weakness  . Iron deficiency anemia     Past Surgical History:  Procedure Laterality Date  . ABDOMINAL AORTAGRAM N/A 01/24/2012   Procedure: ABDOMINAL Maxcine Ham;  Surgeon: Elam Dutch, MD;  Location: [facility] CATH LAB;  Service: Cardiovascular;  Laterality: N/A;  . ABDOMINAL AORTOGRAM W/LOWER EXTREMITY N/A 04/15/2017   Procedure: ABDOMINAL AORTOGRAM W/LOWER EXTREMITY;  Surgeon: Angelia Mould, MD;  Location: [place] CV LAB;  Service: Cardiovascular;  Laterality: N/A;  . AMPUTATION Right 05/09/2017   Procedure: RIGHT TRANSMETATARSAL AMPUTATION;  Surgeon: [name], MD;  Location: [place];  Service: Orthopedics;  Laterality: Right;  . ARTERIOVENOUS GRAFT PLACEMENT Right right arm  . AV FISTULA PLACEMENT Left 08/31/2015   Procedure: ARTERIOVENOUS (AV) FISTULA CREATION- LEFT RADIOCEPHALIC;  Surgeon: Mal Misty, MD;  Location: [place];  Service: Vascular;  Laterality: Left;  . AV FISTULA PLACEMENT Right 02/18/2017   Procedure: INSERTION OF ARTERIOVENOUS (AV) GORE-TEX GRAFT  RIGHT UPPER ARM;  Surgeon: Conrad Troy, MD;  Location: [place];  Service: Vascular;  Laterality: Right;  . BASCILIC VEIN TRANSPOSITION Left  12/19/2015   Procedure: FIRST STAGE BRACHIAL VEIN TRANSPOSITION;  Surgeon: Conrad [place], MD;  Location: [place];  Service: Vascular;  Laterality: Left;  . BASCILIC VEIN TRANSPOSITION Left 02/08/2016   Procedure: SECOND STAGE BRACHIAL VEIN TRANSPOSITION;  Surgeon: Conrad Montrose, MD;  Location: [place];  Service: Vascular;  Laterality: Left;  . CARDIAC CATHETERIZATION N/A 07/11/2015   Procedure: Left Heart Cath and Coronary Angiography;  Surgeon: Troy Sine, MD;  Location: [place] CV LAB;  Service: Cardiovascular;  Laterality: N/A;  . Carpel Tunnel Left Dec. 22, 2016  . CHOLECYSTECTOMY    . COLONOSCOPY  2004   Dr. Irving Shows, left sided diverticula and cecal polyp, path unknown  . COLONOSCOPY  10/29/2011   Procedure: COLONOSCOPY;  Surgeon: Daneil Dolin, MD;  [facility];  Service: Endoscopy;  Laterality: N/A;  10:15  . ESOPHAGOGASTRODUODENOSCOPY  11/2002   Dr. Gala Romney, erosive reflux esophagitis, multiple gastric ulcer and antral/bulbar erosions. Serologies positive for H.Pylori and was treated  . ESOPHAGOGASTRODUODENOSCOPY  11/20014   Dr. Gala Romney, small hh only, ulcers healed  . ESOPHAGOGASTRODUODENOSCOPY  09/21/2011   Dr Trevor Iha [facility], antral erosions, ?early GAVE  . FISTULOGRAM Left 12/10/2016   Procedure: THROMBECTOMY OF LEFT ARM ARTERIOVENOUS FISTULA;  Surgeon: Waynetta Sandy, MD;  Location: [place];  Service: Vascular;  Laterality: Left;  . INSERTION OF DIALYSIS CATHETER Left 12/10/2016   Procedure: INSERTION OF TUNNELED DIALYSIS CATHETER;  Surgeon: Waynetta Sandy, MD;  Location: [place];  Service: Vascular;  Laterality: Left;  . IR GENERIC HISTORICAL  07/16/2016   IR REMOVAL TUN CV CATH W/O FL 07/16/2016 Saverio Danker, PA-C [facility]-INTERV RAD  . LIGATION OF ARTERIOVENOUS  FISTULA Left 12/19/2015   Procedure: LIGATION OF RADIOCEPHALIC ARTERIOVENOUS  FISTULA;  Surgeon: Conrad [place], MD;  Location: [place];  Service: Vascular;  Laterality: Left;  Marland Kitchen MASS EXCISION Right  02/18/2017   Procedure: EXCISION OF RIGHT AXILLARY EPIDERMAL INCLUSION CYST;  Surgeon: Conrad [place], MD;  Location: [place];  Service: Vascular;  Laterality: Right;  . PERIPHERAL VASCULAR CATHETERIZATION N/A 12/14/2015   Procedure: Fistulagram;  Surgeon: Conrad , MD;  Location: [place] CV LAB;  Service: Cardiovascular;  Laterality: N/A;  . SHOULDER SURGERY    . ULTRASOUND GUIDANCE FOR VASCULAR ACCESS  04/15/2017   Procedure: Ultrasound Guidance For Vascular Access;  Surgeon: Angelia Mould, MD;  Location: [place] CV LAB;  Service: Cardiovascular;;  . UPPER EXTREMITY VENOGRAPHY Bilateral 12/17/2016   Procedure: Bilateral Upper Extremity Venography;  Surgeon: Serafina Mitchell, MD;  Location: [place] CV LAB;  Service: Cardiovascular;  Laterality: Bilateral;    Prior to Admission medications   Medication Sig Start Date End Date Taking? Authorizing Provider  ALPRAZolam Duanne Moron) 0.5 MG tablet Take 0.5 mg by mouth daily. 10/06/15  Yes [provider]  atorvastatin (LIPITOR) 40 MG tablet Take 40 mg by mouth daily. 11/03/15  Yes [provider]  b complex-vitamin c-folic acid (NEPHRO-VITE) 0.8 MG TABS Take 0.8 mg by mouth See admin instructions. Takes on Tuesdays, Thursdays, Saturdays, and Sundays. Does not take on Mondays, Wednesdays, and Fridays due to Dialysis treatments.   Yes [provider]  cinacalcet (SENSIPAR) 60 MG tablet Take 60 mg by mouth every evening. With evening meal   Yes [provider]  meclizine (ANTIVERT) 25 MG tablet Take 25 mg by mouth daily as needed for dizziness (Non dialysis days).    Yes [provider]  ondansetron (ZOFRAN) 8 MG tablet Take 1 tablet (8 mg total) by mouth every 8 (eight) hours as needed for nausea or vomiting. 03/24/17  Yes Daleen Bo, MD  promethazine (PHENERGAN) 25 MG tablet Take 25 mg by mouth every 8 (eight) hours as needed for nausea or vomiting.  08/03/10  Yes [provider]   sevelamer (RENVELA) 800 MG tablet Take 1,600-2,400 mg by mouth See admin instructions. Take 2400  mg by mouth 3 times daily with full meals and take 1600 mg by mouth with snacks.   Yes [provider]  HYDROcodone-acetaminophen (NORCO/VICODIN) 5-325 MG tablet Take 1 tablet by mouth every 4 (four) hours as needed for moderate pain. 05/09/17   [name], MD    Current Facility-Administered Medications  Medication Dose Route Frequency Provider Last Rate Last Dose  . 0.9 %  sodium chloride infusion   Intravenous Continuous [name], MD      . 0.9 %  sodium chloride infusion  100 mL Intravenous PRN Edrick Oh, MD      . 0.9 %  sodium chloride infusion  100 mL Intravenous PRN Edrick Oh, MD      . acetaminophen (TYLENOL) tablet 650 mg  650 mg Oral Q4H PRN [name], MD       Or  . acetaminophen (TYLENOL) suppository 650 mg  650 mg Rectal Q4H PRN [name], MD      . ALPRAZolam Duanne Moron) tablet 0.5 mg  0.5 mg Oral Daily [name], MD  0.5 mg at 05/09/17 1611  . alteplase (CATHFLO ACTIVASE) injection 2 mg  2 mg Intracatheter Once PRN Edrick Oh, MD      . aspirin EC tablet 325 mg  325 mg Oral QHS [name], MD      . atorvastatin (LIPITOR) tablet 40 mg  40 mg Oral Daily [name], MD   40 mg at 05/09/17 1611  . bisacodyl (DULCOLAX) suppository 10 mg  10 mg Rectal Daily PRN [name], MD      . cinacalcet [facility]) tablet 60 mg  60 mg Oral QPM [name], MD   60 mg at 05/09/17 1702  . docusate sodium (COLACE) capsule 100 mg  100 mg Oral BID [name], MD   100 mg at 05/09/17 2134  . heparin injection 1,000 Units  1,000 Units Dialysis PRN Edrick Oh, MD      . HYDROcodone-acetaminophen (NORCO/VICODIN) 5-325 MG per tablet 1 tablet  1 tablet Oral Q4H PRN [name], MD      . HYDROmorphone (DILAUDID) injection 1 mg  1 mg Intravenous Q2H PRN [name], MD      . lidocaine (PF) (XYLOCAINE) 1 % injection 5 mL  5 mL Intradermal PRN  Edrick Oh, MD      . lidocaine-prilocaine (EMLA) cream 1 application  1 application Topical PRN Edrick Oh, MD      . magnesium citrate solution 1 Bottle  1 Bottle Oral Once PRN [name], MD      . methocarbamol (ROBAXIN) tablet 500 mg  500 mg Oral Q6H PRN [name], MD       Or  . methocarbamol (ROBAXIN) 500 mg in dextrose 5 % 50 mL IVPB  500 mg Intravenous Q6H PRN [name], MD      . metoCLOPramide (REGLAN) tablet 5-10 mg  5-10 mg Oral Q8H PRN [name], MD       Or  . metoCLOPramide (REGLAN) injection 5-10 mg  5-10 mg Intravenous Q8H PRN [name], MD      . ondansetron [facility]) tablet 4 mg  4 mg Oral Q6H PRN [name], MD       Or  . ondansetron [facility]) injection 4 mg  4 mg Intravenous Q6H PRN [name], MD      . oxyCODONE (Oxy IR/ROXICODONE) immediate release tablet 10 mg  10 mg Oral Q3H PRN [name], MD   10 mg at 05/09/17 1611  . pentafluoroprop-tetrafluoroeth (GEBAUERS) aerosol 1 application  1 application Topical PRN Edrick Oh, MD      . polyethylene glycol (MIRALAX / GLYCOLAX) packet 17 g  17 g Oral Daily PRN [name], MD      . sevelamer carbonate (RENVELA) tablet 1,600 mg  1,600 mg Oral PRN [name], MD      . sevelamer carbonate (RENVELA) tablet 2,400 mg  2,400 mg Oral TID WC [name], MD   2,400 mg at 05/09/17 1611    Allergies as of 05/08/2017 - Review Complete 05/08/2017  Allergen Reaction Noted  . Aspirin Other (See Comments) 10/03/2011    Family History  Problem Relation Age of Onset  . Hypertension Mother   . Colon cancer Neg Hx   . Liver disease Neg Hx     Social History   Socioeconomic History  . Marital status: Single    Spouse name: Not on file  . Number of children: 2  .  Years of education: Not on file  . Highest education level: Not on file  Social Needs  . Financial resource strain: Not on file  . Food insecurity - worry: Not on file  . Food insecurity - inability: Not on file   . Transportation needs - medical: Not on file  . Transportation needs - non-medical: Not on file  Occupational History  . Occupation: retired, Licensed conveyancer    Employer: RETIRED  Tobacco Use  . Smoking status: Former Smoker    Last attempt to quit: 04/29/2004    Years since quitting: 13.0  . Smokeless tobacco: Former Systems developer    Types: Chew    Quit date: 01/16/1987  . Tobacco comment: quit 2006  Substance and Sexual Activity  . Alcohol use: No  . Drug use: No  . Sexual activity: Not on file  Other Topics Concern  . Not on file  Social History Narrative   Lives alone   Daughter 20-min away   Caffeine use: 32oz soda per day    Review of Systems: Gen: Denies any fever, chills, sweats, anorexia, fatigue, weakness, malaise, weight loss, and sleep disorder HEENT: No visual complaints, No history of Retinopathy. Normal external appearance No Epistaxis or Sore throat. No sinusitis.   CV: Denies chest pain, angina, palpitations, syncope, orthopnea, PND, peripheral edema, and claudication. Resp: Denies dyspnea at rest, dyspnea with exercise, cough, sputum, wheezing, coughing up blood, and pleurisy. GI: Denies vomiting blood, jaundice, and fecal incontinence.   Denies dysphagia or odynophagia. History of gastric ulcer  GU : Denies urinary burning, blood in urine, urinary frequency, urinary hesitancy, nocturnal urination, and urinary incontinence.  No renal calculi. MS: Denies joint pain, limitation of movement, and swelling, stiffness, low back pain, extremity pain. Denies muscle weakness, cramps, atrophy.  No use of non steroidal antiinflammatory drugs. Derm: Denies rash, itching, dry skin, hives, moles, warts, or unhealing ulcers.  Psych: Denies depression, anxiety, memory loss, suicidal ideation, hallucinations, paranoia, and confusion. Heme: Denies bruising, bleeding, and enlarged lymph nodes. Neuro: No headache.  No diplopia. No dysarthria.  No dysphasia.    history of CVA.  No Seizures. No  paresthesias.  No weakness. Endocrine No DM.  No Thyroid disease.  No Adrenal disease.  Physical Exam: Vital signs in last 24 hours: Temp:  [97 F (36.1 C)-98.4 F (36.9 C)] 98.2 F (36.8 C) (01/12 0735) Pulse Rate:  [76-95] 88 (01/12 0745) Resp:  [15-18] 17 (01/12 0745) BP: (120-164)/(57-87) 132/76 (01/12 0745) SpO2:  [93 %-100 %] 97 % (01/12 0735) Weight:  [175 lb (79.4 kg)-175 lb 14.8 oz (79.8 kg)] 175 lb 14.8 oz (79.8 kg) (01/12 0735) Last BM Date: 05/09/17 General:   Alert,  Well-developed, well-nourished, pleasant and cooperative in NAD Head:  Normocephalic and atraumatic. Eyes:  Sclera clear, no icterus.   Conjunctiva pink. Ears:  Normal auditory acuity. Nose:  No deformity, discharge,  or lesions. Mouth:  No deformity or lesions, dentition normal. Neck:  Supple; no masses or thyromegaly. JVP not elevated Lungs:  Clear throughout to auscultation.   No wheezes, crackles, or rhonchi. No acute distress. Heart:  Regular rate and rhythm; no murmurs, clicks, rubs,  or gallops. Abdomen:  Soft, nontender and nondistended. No masses, hepatosplenomegaly or hernias noted. Normal bowel sounds, without guarding, and without rebound.   Msk:  Symmetrical without gross deformities. Normal posture. Pulses:  No carotid, renal, femoral bruits. DP and PT symmetrical and equal Extremities:  Without clubbing or edema. Neurologic:  Alert and  oriented x4;  grossly normal neurologically. Skin:  Intact without significant lesions or rashes. Cervical Nodes:  No significant cervical adenopathy. Psych:  Alert and cooperative. Normal mood and affect.  Intake/Output from previous day: 01/11 0701 - 01/12 0700 In: 300 [I.V.:300] Out: -  Intake/Output this shift: No intake/output data recorded.  Lab Results: Recent Labs    05/09/17 0835 05/09/17 0925  WBC  --  8.3  HGB 12.9* 11.3*  HCT 38.0* 36.0*  PLT  --  220   BMET Recent Labs    05/09/17 0835 05/09/17 0925  NA 133* 137  K 8.1* 4.1   CL  --  98*  CO2  --  26  GLUCOSE 93 81  BUN  --  25*  CREATININE  --  9.13*  CALCIUM  --  8.9   LFT No results for input(s): PROT, ALBUMIN, AST, ALT, ALKPHOS, BILITOT, BILIDIR, IBILI in the last 72 hours. PT/INR No results for input(s): LABPROT, INR in the last 72 hours. Hepatitis Panel No results for input(s): HEPBSAG, HCVAB, HEPAIGM, HEPBIGM in the last 72 hours.  Studies/Results: No results found.  Assessment/Plan:  ESRD- MWF dialysis [place]   ANEMIA-Stable no ESA  MBD- binders vitamin D and cinacalcet  HTN/VOL-will proceed with dialysis for volume control and resume home BP medications   ACCESS- AVF   OTHER- Will request records from out patient facility    LOS: 1 [place] W @TODAY @8 :06 AM

## 2017-05-11 NOTE — Progress Notes (Signed)
Subjective: 2 Days Post-Op Procedure(s) (LRB): RIGHT TRANSMETATARSAL AMPUTATION (Right) Patient reports pain as mild and moderate.    Objective: Vital signs in last 24 hours: Temp:  [97.5 F (36.4 C)-99.3 F (37.4 C)] 98.9 F (37.2 C) (01/13 0527) Pulse Rate:  [71-92] 92 (01/13 0527) Resp:  [16-18] 16 (01/12 1333) BP: (99-140)/(52-67) 100/61 (01/13 0527) SpO2:  [95 %-100 %] 95 % (01/13 0527) Weight:  [168 lb 14 oz (76.6 kg)] 168 lb 14 oz (76.6 kg) (01/12 1154)   VAC in place.  Comfortable  Intake/Output from previous day: 01/12 0701 - 01/13 0700 In: 450 [P.O.:450] Out: 3000  Intake/Output this shift: No intake/output data recorded.  Recent Labs    05/09/17 0835 05/09/17 0925 05/10/17 0803  HGB 12.9* 11.3* 11.3*   Recent Labs    05/09/17 0925 05/10/17 0803  WBC 8.3 10.1  RBC 5.62 5.33  HCT 36.0* 33.9*  PLT 220 287   Recent Labs    05/09/17 0925 05/10/17 0803  NA 137 136  K 4.1 3.5  CL 98* 97*  CO2 26 23  BUN 25* 36*  CREATININE 9.13* 10.70*  GLUCOSE 81 83  CALCIUM 8.9 8.7*   No results for input(s): LABPT, INR in the last 72 hours.  VAC in place  Assessment/Plan: 2 Days Post-Op Procedure(s) (LRB): RIGHT TRANSMETATARSAL AMPUTATION (Right) Discharge to SNF  [facility] PA-C 05/11/2017, 11:04 AM

## 2017-05-11 NOTE — Progress Notes (Signed)
CSW spoke with patient's daughter, [name] to provide an update about discharge plan. Linda in agreement with SNF placement in [place], thankful for CSW interaction.  Madilyn Fireman, MSW, LCSW-A Weekend Clinical Social Worker [phone]

## 2017-05-11 NOTE — Clinical Social Work Note (Signed)
Clinical Social Work Assessment  Patient Details  Name: Joseph SMOCK Sr. MRN: [id number] Date of Birth: 1940-12-05  Date of referral:  05/11/17               Reason for consult:  Discharge Planning                Permission sought to share information with:  Facility Sport and exercise psychologist Permission granted to share information::  Yes, Verbal Permission Granted  Name::     [name]  Agency::     Relationship::  Daughter  Contact Information:  [phone]  Housing/Transportation Living arrangements for the past 2 months:  [place] of Information:  Patient Patient Interpreter Needed:  None Criminal Activity/Legal Involvement Pertinent to Current Situation/Hospitalization:  No - Comment as needed Significant Relationships:    Lives with:  Self Do you feel safe going back to the place where you live?  Yes Need for family participation in patient care:  Yes (Comment)  Care giving concerns:  Patient lives alone and does not feel safe returning home with [facility] PT due to the time he would be left alone. He stated he wants to go to SNF until he gets better.   Social Worker assessment / plan:  CSW will fax out patient to facilities in [place]. Patient's daughter [name] lives there and would be able to visit with him regularly.  Employment status:  Retired Nurse, adult PT Recommendations:  Home with Home Health(PT recommended [place] PT but patient lives alone and does not feel safe being alone until he is fully recovered) Information / Referral to community resources:  [place]  Patient/Family's Response to care: Patient desires SNF instead of [facility] PT for initial discharge plan.  Patient/Family's Understanding of and Emotional Response to Diagnosis, Current Treatment, and Prognosis:  Patient understanding of SNF placement process, answered questions.  Emotional Assessment Appearance:  Appears stated age Attitude/Demeanor/Rapport:     Affect (typically observed):  Accepting Orientation:  Oriented to Self, Oriented to Place, Oriented to Situation, Oriented to  Time Alcohol / Substance use:  Not Applicable Psych involvement (Current and /or in the community):  No (Comment)  Discharge Needs  Concerns to be addressed:  Discharge Planning Concerns Readmission within the last 30 days:  No Current discharge risk:  Physical Impairment, Lives alone Barriers to Discharge:  Unsafe home situation(Patient lives alone, does not feel safe returning there yet)   Archie Endo, LCSW 05/11/2017, 10:53 AM

## 2017-05-11 NOTE — Progress Notes (Signed)
Physical Therapy Treatment Patient Details Name: [name] Sr. MRN: [id number] DOB: 1940/07/04 Today's Date: 05/11/2017    History of Present Illness 77 y.o. male s/p right transmetatarsal amputation 1/11. PMH includes: ESRD on dialysis, CVA, aortic stenosis, HTN, Anxiety, Cardiomyopathy, shoulder surgery.     PT Comments    Pt progressing with activity but still putting more wt through RLE than TDWB. He is only [place] through heel and he can verbalize TDWB but does not seem to translate to what he does functionally. Will keep working on this. Ambulated 125' with RW and min-guard A. PT will continue to follow.    Follow Up Recommendations  Home health PT;Supervision for mobility/OOB     Equipment Recommendations  Rolling walker with 5" wheels    Recommendations for Other Services       Precautions / Restrictions Precautions Precautions: Fall Restrictions Weight Bearing Restrictions: Yes RLE Weight Bearing: Touchdown weight bearing    Mobility  Bed Mobility Overal bed mobility: Modified Independent Bed Mobility: Supine to Sit     Supine to sit: Modified independent (Device/Increase time)     General bed mobility comments: mod I to EOB  Transfers Overall transfer level: Needs assistance Equipment used: Rolling walker (2 wheeled) Transfers: Sit to/from Stand Sit to Stand: Supervision         General transfer comment: cues for safe hand placement with sit to stand and stand to sit  Ambulation/Gait Ambulation/Gait assistance: Min guard Ambulation Distance (Feet): 125 Feet Assistive device: Rolling walker (2 wheeled) Gait Pattern/deviations: Step-through pattern;Decreased stance time - right;Decreased stride length;Trunk flexed Gait velocity: decreased Gait velocity interpretation: Below normal speed for age/gender General Gait Details: cues for sequencing, posture, and maintaining TDWB; pt appears to be more PWB still despite education on techniques to offload  weight on R LE. He did do better staying within RW today   Stairs            Wheelchair Mobility    Modified Rankin (Stroke Patients Only)       Balance Overall balance assessment: Needs assistance Sitting-balance support: Feet unsupported;No upper extremity supported Sitting balance-Leahy Scale: Normal     Standing balance support: During functional activity;Bilateral upper extremity supported Standing balance-Leahy Scale: Fair Standing balance comment: safer with BUE support at this time                            Cognition Arousal/Alertness: Awake/alert Behavior During Therapy: WFL for tasks assessed/performed Overall Cognitive Status: No family/caregiver present to determine baseline cognitive functioning Area of Impairment: Memory                     Memory: Decreased recall of precautions;Decreased short-term memory         General Comments: pt appears in tact, however, does not seem able to comprehend instructions for TDWB. Several visual pictures given for him and he nods in agreement but then ambulates with more than TDWB despite frequent cues.       Exercises General Exercises - Lower Extremity Ankle Circles/Pumps: AROM;10 reps;Both    General Comments General comments (skin integrity, edema, etc.): reinforced need for RLE elevation in chair and bed      Pertinent Vitals/Pain Pain Assessment: 0-10 Pain Score: 9  Pain Location: R foot Pain Descriptors / Indicators: Discomfort Pain Intervention(s): Monitored during session;Repositioned    Home Living  Prior Function            PT Goals (current goals can now be found in the care plan section) Acute Rehab PT Goals Patient Stated Goal: [facility] Goal Formulation: With patient/family Time For Goal Achievement: 05/16/17 Potential to Achieve Goals: Good Progress towards PT goals: Progressing toward goals    Frequency    Min  5X/week      PT Plan Current plan remains appropriate    Co-evaluation              AM-PAC PT "6 Clicks" Daily Activity  Outcome Measure  Difficulty turning over in bed (including adjusting bedclothes, sheets and blankets)?: A Little Difficulty moving from lying on back to sitting on the side of the bed? : A Little Difficulty sitting down on and standing up from a chair with arms (e.g., wheelchair, bedside commode, etc,.)?: A Little Help needed moving to and from a bed to chair (including a wheelchair)?: A Little Help needed walking in hospital room?: A Little Help needed climbing 3-5 steps with a railing? : A Little 6 Click Score: 18    End of Session Equipment Utilized During Treatment: Gait belt Activity Tolerance: Patient tolerated treatment well Patient left: with call bell/phone within reach;in chair Nurse Communication: Mobility status PT Visit Diagnosis: Unsteadiness on feet (R26.81);Other abnormalities of gait and mobility (R26.89);Pain;Difficulty in walking, not elsewhere classified (R26.2) Pain - Right/Left: Right Pain - part of body: Ankle and joints of foot     Time: 9024-0973 PT Time Calculation (min) (ACUTE ONLY): 27 min  Charges:  $Gait Training: 23-37 mins                    G Codes:       Leighton Roach, PT  Acute Rehab Services  [place] 05/11/2017, 10:22 AM

## 2017-05-11 NOTE — Progress Notes (Signed)
[facility] ROUNDING NOTE   Subjective:   No complaints this am  S/p Right transmetatarsal amputation   History of CVA and aortic stenosis He lives alone  I think he will need SNF   Objective:  Vital signs in last 24 hours:  Temp:  [97.5 F (36.4 C)-99.3 F (37.4 C)] 98.9 F (37.2 C) (01/13 0527) Pulse Rate:  [71-103] 92 (01/13 0527) Resp:  [16-18] 16 (01/12 1333) BP: (99-143)/(52-75) 100/61 (01/13 0527) SpO2:  [95 %-100 %] 95 % (01/13 0527) Weight:  [168 lb 14 oz (76.6 kg)] 168 lb 14 oz (76.6 kg) (01/12 1154)  Weight change: 14.8 oz (0.42 kg) Filed Weights   05/09/17 0857 05/10/17 0735 05/10/17 1154  Weight: 175 lb (79.4 kg) 175 lb 14.8 oz (79.8 kg) 168 lb 14 oz (76.6 kg)    Intake/Output: I/O last 3 completed shifts: In: 450 [P.O.:450] Out: 3000 [Other:3000]   Intake/Output this shift:  No intake/output data recorded.  CVS- RRR RS- CTA ABD- BS present soft non-distended EXT-  Foot amp on R with wound vac   Basic Metabolic Panel: Recent Labs  Lab 05/09/17 0835 05/09/17 0925 05/10/17 0803  NA 133* 137 136  K 8.1* 4.1 3.5  CL  --  98* 97*  CO2  --  26 23  GLUCOSE 93 81 83  BUN  --  25* 36*  CREATININE  --  9.13* 10.70*  CALCIUM  --  8.9 8.7*  PHOS  --   --  5.4*    Liver Function Tests: Recent Labs  Lab 05/10/17 0803  ALBUMIN 3.0*   No results for input(s): LIPASE, AMYLASE in the last 168 hours. No results for input(s): AMMONIA in the last 168 hours.  CBC: Recent Labs  Lab 05/09/17 0835 05/09/17 0925 05/10/17 0803  WBC  --  8.3 10.1  HGB 12.9* 11.3* 11.3*  HCT 38.0* 36.0* 33.9*  MCV  --  64.1* 63.6*  PLT  --  220 287    Cardiac Enzymes: No results for input(s): CKTOTAL, CKMB, CKMBINDEX, TROPONINI in the last 168 hours.  BNP: Invalid input(s): POCBNP  CBG: No results for input(s): GLUCAP in the last 168 hours.  Microbiology: Results for orders placed or performed during the hospital encounter of 02/18/17   Aerobic/Anaerobic Culture (surgical/deep wound)     Status: None   Collection Time: 02/18/17  9:09 AM  Result Value Ref Range Status   Specimen Description FLUID RIGHT  Final   Special Requests   Final    RIGHT AXILLARY EPIDERMAL CYST SWABS ONLY PATIENT ON FOLLOWING ZENICEPH   Gram Stain   Final    RARE WBC PRESENT, PREDOMINANTLY PMN FEW GRAM POSITIVE COCCI    Culture   Final    NO GROWTH AEROBICALLY MIXED ANAEROBIC FLORA PRESENT.  CALL LAB IF FURTHER IID REQUIRED.    Report Status 02/22/2017 FINAL  Final    Coagulation Studies: No results for input(s): LABPROT, INR in the last 72 hours.  Urinalysis: No results for input(s): COLORURINE, LABSPEC, PHURINE, GLUCOSEU, HGBUR, BILIRUBINUR, KETONESUR, PROTEINUR, UROBILINOGEN, NITRITE, LEUKOCYTESUR in the last 72 hours.  Invalid input(s): APPERANCEUR    Imaging: No results found.   Medications:   . sodium chloride    . methocarbamol (ROBAXIN)  IV     . ALPRAZolam  0.5 mg Oral Daily  . aspirin EC  325 mg Oral QHS  . atorvastatin  40 mg Oral Daily  . cinacalcet  60 mg Oral QPM  . docusate sodium  100 mg Oral BID  . sevelamer carbonate  2,400 mg Oral TID WC   acetaminophen **OR** acetaminophen, bisacodyl, HYDROcodone-acetaminophen, HYDROmorphone (DILAUDID) injection, magnesium citrate, methocarbamol **OR** methocarbamol (ROBAXIN)  IV, metoCLOPramide **OR** metoCLOPramide (REGLAN) injection, ondansetron **OR** ondansetron (ZOFRAN) IV, oxyCODONE, polyethylene glycol, sevelamer carbonate  Assessment/ Plan:   ESRD- MWF dialysis [place]   ANEMIA-Stable no ESA  MBD- binders vitamin D and cinacalcet  HTN/VOL-will proceed with dialysis for volume control and resume home BP medications   ACCESS- AVF        LOS: 2 Kathalene Sporer W @TODAY @10 :21 AM

## 2017-05-11 NOTE — NC FL2 (Signed)
[place] LEVEL OF CARE SCREENING TOOL     IDENTIFICATION  Patient Name: Joseph NEWBERRY Sr. Birthdate: [birth year] Sex: male Admission Date (Current Location): 05/09/2017  [facility] [place] and [place] Number:  [place] and Address:  [facility], [place] [address], [place], [place] [address]      Provider Number: [id number]  Attending Physician Name and Address:  [name], MD  Relative Name and Phone Number:  [name] [phone]    Current Level of Care: Hospital Recommended Level of Care: [place] Prior Approval Number:    Date Approved/Denied:   PASRR Number: [id number]  Discharge Plan: SNF    Current Diagnoses: Patient Active Problem List   Diagnosis Date Noted  . S/P transmetatarsal amputation of foot, right ([place]) 05/09/2017  . PVD (peripheral vascular disease) ([place]) 04/15/2017  . Nonischemic cardiomyopathy ([place]) 08/15/2015  . Aortic stenosis 08/15/2015  . Moderate aortic stenosis 07/12/2015  . 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 CAD- 40-50% LAD at cath 07/11/15 07/11/2015  . Aspirin intolerance 07/11/2015  . Abnormal stress test   . ESRD on dialysis ([place]) 05/30/2015  . CTS (carpal tunnel syndrome) 02/28/2015  . PVD of LE - Dr Oneida Alar follows 08/20/2012  . Ulcer of foot ([place]) 01/16/2012  . Encounter for screening colonoscopy 10/08/2011  . Melena 10/08/2011  . Other complications due to renal dialysis device, implant, and graft 09/24/2011    Orientation RESPIRATION BLADDER Height & Weight     Self, Time, Situation, Place  Normal Continent Weight: 168 lb 14 oz (76.6 kg) Height:  5\' 5"  (165.1 cm)  BEHAVIORAL SYMPTOMS/MOOD NEUROLOGICAL BOWEL NUTRITION STATUS      Continent (Please see d/c summary)  AMBULATORY STATUS COMMUNICATION OF NEEDS Skin   Limited Assist Verbally Surgical wounds(Patient has post-op shoe on)                       Personal Care Assistance Level of  Assistance  Dressing, Bathing Bathing Assistance: Independent   Dressing Assistance: Independent     Functional Limitations Info  Sight, Hearing, Speech Sight Info: Adequate Hearing Info: Adequate Speech Info: Adequate    SPECIAL CARE FACTORS FREQUENCY  PT (By licensed PT)     PT Frequency: 5x OT Frequency: 3x            Contractures Contractures Info: Not present    Additional Factors Info  Code Status, Allergies Code Status Info: Full Code Allergies Info: Aspirin           Current Medications (05/11/2017):  This is the current hospital active medication list Current Facility-Administered Medications  Medication Dose Route Frequency Provider Last Rate Last Dose  . 0.9 %  sodium chloride infusion   Intravenous Continuous [name], MD      . acetaminophen (TYLENOL) tablet 650 mg  650 mg Oral Q4H PRN [name], MD       Or  . acetaminophen (TYLENOL) suppository 650 mg  650 mg Rectal Q4H PRN [name], MD      . ALPRAZolam Duanne Moron) tablet 0.5 mg  0.5 mg Oral Daily [name], MD   0.5 mg at 05/11/17 1003  . aspirin EC tablet 325 mg  325 mg Oral QHS [name], MD      . atorvastatin (LIPITOR) tablet 40 mg  40 mg Oral Daily [name], MD   40 mg at 05/11/17 1003  . bisacodyl (  DULCOLAX) suppository 10 mg  10 mg Rectal Daily PRN [name], MD      . cinacalcet [facility]) tablet 60 mg  60 mg Oral QPM [name], MD   60 mg at 05/10/17 1807  . docusate sodium (COLACE) capsule 100 mg  100 mg Oral BID [name], MD   100 mg at 05/11/17 1003  . HYDROcodone-acetaminophen (NORCO/VICODIN) 5-325 MG per tablet 1 tablet  1 tablet Oral Q4H PRN [name], MD      . HYDROmorphone (DILAUDID) injection 1 mg  1 mg Intravenous Q2H PRN [name], MD      . magnesium citrate solution 1 Bottle  1 Bottle Oral Once PRN [name], MD      . methocarbamol (ROBAXIN) tablet 500 mg  500 mg Oral Q6H PRN [name], MD   500 mg at 05/10/17  1522   Or  . methocarbamol (ROBAXIN) 500 mg in dextrose 5 % 50 mL IVPB  500 mg Intravenous Q6H PRN [name], MD      . metoCLOPramide (REGLAN) tablet 5-10 mg  5-10 mg Oral Q8H PRN [name], MD       Or  . metoCLOPramide (REGLAN) injection 5-10 mg  5-10 mg Intravenous Q8H PRN [name], MD      . ondansetron [facility]) tablet 4 mg  4 mg Oral Q6H PRN [name], MD       Or  . ondansetron [facility]) injection 4 mg  4 mg Intravenous Q6H PRN [name], MD      . oxyCODONE (Oxy IR/ROXICODONE) immediate release tablet 10 mg  10 mg Oral Q3H PRN [name], MD   10 mg at 05/10/17 1522  . polyethylene glycol (MIRALAX / GLYCOLAX) packet 17 g  17 g Oral Daily PRN [name], MD      . sevelamer carbonate (RENVELA) tablet 1,600 mg  1,600 mg Oral PRN [name], MD      . sevelamer carbonate (RENVELA) tablet 2,400 mg  2,400 mg Oral TID WC [name], MD   2,400 mg at 05/11/17 8341     Discharge Medications: Please see discharge summary for a list of discharge medications.  Relevant Imaging Results:  Relevant Lab Results:   Additional Information SSN: [id number]  Archie Endo, Latanya Presser Weekend Clinical Social Worker [phone]

## 2017-05-12 ENCOUNTER — Inpatient Hospital Stay (HOSPITAL_COMMUNITY): Payer: Medicare Other

## 2017-05-12 ENCOUNTER — Encounter (HOSPITAL_COMMUNITY): Payer: Self-pay | Admitting: Interventional Radiology

## 2017-05-12 HISTORY — PX: IR REMOVAL TUN CV CATH W/O FL: IMG2289

## 2017-05-12 LAB — CBC
HCT: 34.7 % — ABNORMAL LOW (ref 39.0–52.0)
Hemoglobin: 11.5 g/dL — ABNORMAL LOW (ref 13.0–17.0)
MCH: 20.9 pg — ABNORMAL LOW (ref 26.0–34.0)
MCHC: 33.1 g/dL (ref 30.0–36.0)
MCV: 63.2 fL — AB (ref 78.0–100.0)
PLATELETS: 195 10*3/uL (ref 150–400)
RBC: 5.49 MIL/uL (ref 4.22–5.81)
RDW: 17.4 % — ABNORMAL HIGH (ref 11.5–15.5)
WBC: 10.8 10*3/uL — AB (ref 4.0–10.5)

## 2017-05-12 LAB — RENAL FUNCTION PANEL
ALBUMIN: 2.9 g/dL — AB (ref 3.5–5.0)
Anion gap: 15 (ref 5–15)
BUN: 36 mg/dL — ABNORMAL HIGH (ref 6–20)
CHLORIDE: 97 mmol/L — AB (ref 101–111)
CO2: 25 mmol/L (ref 22–32)
Calcium: 9.2 mg/dL (ref 8.9–10.3)
Creatinine, Ser: 10.3 mg/dL — ABNORMAL HIGH (ref 0.61–1.24)
GFR, EST AFRICAN AMERICAN: 5 mL/min — AB (ref >60)
GFR, EST NON AFRICAN AMERICAN: 4 mL/min — AB (ref >60)
Glucose, Bld: 127 mg/dL — ABNORMAL HIGH (ref 65–99)
PHOSPHORUS: 4.5 mg/dL (ref 2.5–4.6)
POTASSIUM: 3.3 mmol/L — AB (ref 3.5–5.1)
Sodium: 137 mmol/L (ref 135–145)

## 2017-05-12 LAB — POCT I-STAT 4, (NA,K, GLUC, HGB,HCT)
Glucose, Bld: 93 mg/dL (ref 65–99)
HEMATOCRIT: 38 % — AB (ref 39.0–52.0)
Hemoglobin: 12.9 g/dL — ABNORMAL LOW (ref 13.0–17.0)
Potassium: 8.1 mmol/L (ref 3.5–5.1)
Sodium: 133 mmol/L — ABNORMAL LOW (ref 135–145)

## 2017-05-12 MED ORDER — LIDOCAINE HCL (PF) 1 % IJ SOLN: 5.0000 mL | INTRAMUSCULAR | Status: DC | PRN

## 2017-05-12 MED ORDER — LIDOCAINE HCL 1 % IJ SOLN
INTRAMUSCULAR | Status: AC
  Filled 2017-05-12: qty 20

## 2017-05-12 MED ORDER — LIDOCAINE-PRILOCAINE 2.5-2.5 % EX CREA: 1.0000 "application " | TOPICAL_CREAM | CUTANEOUS | Status: DC | PRN

## 2017-05-12 MED ORDER — CHLORHEXIDINE GLUCONATE 4 % EX LIQD
CUTANEOUS | Status: AC
  Filled 2017-05-12: qty 15

## 2017-05-12 MED ORDER — SODIUM CHLORIDE 0.9 % IV SOLN
100.0000 mL | INTRAVENOUS | Status: DC | PRN
Start: 2017-05-12 — End: 2017-05-12

## 2017-05-12 MED ORDER — PENTAFLUOROPROP-TETRAFLUOROETH EX AERO
1.0000 | INHALATION_SPRAY | CUTANEOUS | Status: DC | PRN
Start: 2017-05-12 — End: 2017-05-12

## 2017-05-12 MED ORDER — HEPARIN SODIUM (PORCINE) 1000 UNIT/ML DIALYSIS: 1000.0000 [IU] | INTRAMUSCULAR | Status: DC | PRN

## 2017-05-12 MED ORDER — ALTEPLASE 2 MG IJ SOLR: 2.0000 mg | Freq: Once | INTRAMUSCULAR | Status: DC | PRN

## 2017-05-12 MED ORDER — SODIUM CHLORIDE 0.9 % IV SOLN: 100.0000 mL | INTRAVENOUS | Status: DC | PRN

## 2017-05-12 NOTE — Social Work (Addendum)
CSW met with patient and daughter at bedside to discuss SNF offers. Pt accepted SNF bed offer from [facility].  Pt going to dialysis in [facility] in [place], M/W/Friday.  CSW called [facility] and left message for call back.  CSW will f/u.  3:37pm: SNF confirmed bed offer.  Pt will discharge when medically ready.  Elissa Hefty, LCSW Clinical Social Worker [phone]

## 2017-05-12 NOTE — Progress Notes (Signed)
  [facility] Progress Note   Assessment/ Plan:   1. S/p R TMT: per ortho.  Has vac in place. 2.ESRD: continue MWF. Normally [facility] .  Dialyzing with 2 15 g needles today.  Will see if we can remove TDC before d/c. 3. Anemia: Hgb 11.1 4. 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 Nutrition: renal diet and vitamins 6. Hypertension: on no antihypertensives at present with adequate Bps  7.  Dispo: to SNF tomorrow.    Subjective:    Seen on HD.  Tolerating treatment well.  Using AVF with 2 15 g needles, TDC in place but not being used.     Objective:   BP (!) 109/52   Pulse (!) 54   Temp 98.5 F (36.9 C) (Oral)   Resp 16   Ht 5\' 5"  (1.651 m)   Wt 77 kg (169 lb 12.1 oz)   SpO2 98%   BMI 28.25 kg/m   Physical Exam: Gen: NAD, sitting in bed, appears in NAD CVS: RRR no m/r/g Resp: clear bilaterally no c/w/r Abd: nondistended Ext:no LE edema  Labs: BMET Recent Labs  Lab 05/09/17 0835 05/09/17 0925 05/10/17 0803 05/12/17 0809  NA 133* 137 136 137  K 8.1* 4.1 3.5 3.3*  CL  --  98* 97* 97*  CO2  --  26 23 25   GLUCOSE 93 81 83 127*  BUN  --  25* 36* 36*  CREATININE  --  9.13* 10.70* 10.30*  CALCIUM  --  8.9 8.7* 9.2  PHOS  --   --  5.4* 4.5   CBC Recent Labs  Lab 05/09/17 0835 05/09/17 0925 05/10/17 0803 05/12/17 0810  WBC  --  8.3 10.1 10.8*  HGB 12.9* 11.3* 11.3* 11.5*  HCT 38.0* 36.0* 33.9* 34.7*  MCV  --  64.1* 63.6* 63.2*  PLT  --  220 287 PENDING    @IMGRELPRIORS @ Medications:    . ALPRAZolam  0.5 mg Oral Daily  . aspirin EC  325 mg Oral QHS  . atorvastatin  40 mg Oral Daily  . cinacalcet  60 mg Oral QPM  . docusate sodium  100 mg Oral BID  . sevelamer carbonate  2,400 mg Oral TID WC     Madelon Lips, MD [facility] pgr [phone] 05/12/2017, 12:16 PM

## 2017-05-12 NOTE — Progress Notes (Signed)
PT Cancellation Note  Patient Details Name: Joseph HOLLINGS Sr. MRN: [id number] DOB: 10-07-1940   Cancelled Treatment:    Reason Eval/Treat Not Completed: Patient at procedure or test/unavailable Attempted to see patient again today. Pt off unit for HD cat removal. PT will continue to follow acutely.    [place] April, PTA Pager: [phone]   05/12/2017, 4:22 PM

## 2017-05-12 NOTE — Procedures (Signed)
Patient seen and examined on Hemodialysis. QB 400 mL/ min via LUE AVF, UF goal 2L Treatment adjusted as needed.  Using 2 15 g needles.  Will remove TDC  Madelon Lips MD [place] pgr [phone] 12:27 PM

## 2017-05-12 NOTE — Progress Notes (Signed)
Patient ID: [name]., male   DOB: [birth year], 77 y.o.   MRN: [id number] Patient is status post right transmetatarsal amputation.  The wound VAC dressing is functioning well the canister is dry.  Patient will need discharge to skilled nursing.  Anticipate discharge to skilled nursing tomorrow.

## 2017-05-12 NOTE — Progress Notes (Signed)
Physical Therapy Cancellation Note   05/12/17 1010  PT Visit Information  Last PT Received On 05/12/17  Reason Eval/Treat Not Completed Patient at procedure or test/unavailable. Pt in HD. PT will check on pt later as time allows.    Earney Navy, PTA Pager: [phone]

## 2017-05-13 DIAGNOSIS — N179 Acute kidney failure, unspecified: Secondary | ICD-10-CM | POA: Diagnosis not present

## 2017-05-13 DIAGNOSIS — I120 Hypertensive chronic kidney disease with stage 5 chronic kidney disease or end stage renal disease: Secondary | ICD-10-CM | POA: Diagnosis not present

## 2017-05-13 DIAGNOSIS — I96 Gangrene, not elsewhere classified: Secondary | ICD-10-CM | POA: Diagnosis not present

## 2017-05-13 DIAGNOSIS — S98919D Complete traumatic amputation of unspecified foot, level unspecified, subsequent encounter: Secondary | ICD-10-CM | POA: Diagnosis not present

## 2017-05-13 DIAGNOSIS — E7849 Other hyperlipidemia: Secondary | ICD-10-CM | POA: Diagnosis not present

## 2017-05-13 DIAGNOSIS — N2581 Secondary hyperparathyroidism of renal origin: Secondary | ICD-10-CM | POA: Diagnosis not present

## 2017-05-13 DIAGNOSIS — M6281 Muscle weakness (generalized): Secondary | ICD-10-CM | POA: Diagnosis not present

## 2017-05-13 DIAGNOSIS — D631 Anemia in chronic kidney disease: Secondary | ICD-10-CM | POA: Diagnosis not present

## 2017-05-13 DIAGNOSIS — N185 Chronic kidney disease, stage 5: Secondary | ICD-10-CM | POA: Diagnosis not present

## 2017-05-13 DIAGNOSIS — F411 Generalized anxiety disorder: Secondary | ICD-10-CM | POA: Diagnosis not present

## 2017-05-13 DIAGNOSIS — R2689 Other abnormalities of gait and mobility: Secondary | ICD-10-CM | POA: Diagnosis not present

## 2017-05-13 DIAGNOSIS — Z4781 Encounter for orthopedic aftercare following surgical amputation: Secondary | ICD-10-CM | POA: Diagnosis not present

## 2017-05-13 DIAGNOSIS — D509 Iron deficiency anemia, unspecified: Secondary | ICD-10-CM | POA: Diagnosis not present

## 2017-05-13 DIAGNOSIS — Z992 Dependence on renal dialysis: Secondary | ICD-10-CM | POA: Diagnosis not present

## 2017-05-13 DIAGNOSIS — F419 Anxiety disorder, unspecified: Secondary | ICD-10-CM | POA: Diagnosis not present

## 2017-05-13 DIAGNOSIS — I429 Cardiomyopathy, unspecified: Secondary | ICD-10-CM | POA: Diagnosis not present

## 2017-05-13 DIAGNOSIS — Z89431 Acquired absence of right foot: Secondary | ICD-10-CM | POA: Diagnosis not present

## 2017-05-13 DIAGNOSIS — N186 End stage renal disease: Secondary | ICD-10-CM | POA: Diagnosis not present

## 2017-05-13 DIAGNOSIS — E785 Hyperlipidemia, unspecified: Secondary | ICD-10-CM | POA: Diagnosis not present

## 2017-05-13 MED ORDER — OXYCODONE-ACETAMINOPHEN 5-325 MG PO TABS: 1.0000 | ORAL_TABLET | Freq: Four times a day (QID) | ORAL | 0 refills | Status: DC | PRN

## 2017-05-13 NOTE — Progress Notes (Signed)
  [facility] Progress Note   Assessment/ Plan:   1. S/p R TMT: per ortho.  Has vac in place.  To SNF. 2.ESRD: continue MWF. Normally [facility].  Dialyzing with 2 15 g needles successfully.  TDC removed. 3. Anemia: Hgb 11.1 4. 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 Nutrition: renal diet and vitamins 6. Hypertension: on no antihypertensives at present with adequate Bps  7.  Dispo: to SNF  Subjective:    For discharge today.  L IJ hemodialysis catheter removed yesterday successfully.     Objective:   BP (!) 101/58 (BP Location: Left Arm)   Pulse 85   Temp 98.3 F (36.8 C) (Oral)   Resp 17   Ht 5\' 5"  (1.651 m)   Wt 75.2 kg (165 lb 12.6 oz)   SpO2 93%   BMI 27.59 kg/m   Physical Exam: Gen: NAD, sitting in bed, appears in NAD CVS: RRR no m/r/g Resp: clear bilaterally no c/w/r Abd: nondistended Ext:no LE edema ACCESS: LUE AVF + T/B, L IJ TDC removed.  Labs: BMET Recent Labs  Lab 05/09/17 0835 05/09/17 0925 05/10/17 0803 05/12/17 0809  NA 133* 137 136 137  K 8.1* 4.1 3.5 3.3*  CL  --  98* 97* 97*  CO2  --  26 23 25   GLUCOSE 93 81 83 127*  BUN  --  25* 36* 36*  CREATININE  --  9.13* 10.70* 10.30*  CALCIUM  --  8.9 8.7* 9.2  PHOS  --   --  5.4* 4.5   CBC Recent Labs  Lab 05/09/17 0835 05/09/17 0925 05/10/17 0803 05/12/17 0810  WBC  --  8.3 10.1 10.8*  HGB 12.9* 11.3* 11.3* 11.5*  HCT 38.0* 36.0* 33.9* 34.7*  MCV  --  64.1* 63.6* 63.2*  PLT  --  220 287 195    @IMGRELPRIORS @ Medications:    . ALPRAZolam  0.5 mg Oral Daily  . aspirin EC  325 mg Oral QHS  . atorvastatin  40 mg Oral Daily  . cinacalcet  60 mg Oral QPM  . docusate sodium  100 mg Oral BID  . sevelamer carbonate  2,400 mg Oral TID WC     Madelon Lips, MD [facility] pgr [phone] 05/13/2017, 10:36 AM

## 2017-05-13 NOTE — Progress Notes (Signed)
Physical Therapy Treatment Patient Details Name: Joseph HIMEBAUGH Sr. MRN: [id number] DOB: [birth year] Today's Date: 05/13/2017    History of Present Illness 77 y.o. male s/p right transmetatarsal amputation 1/11. PMH includes: ESRD on dialysis, CVA, aortic stenosis, HTN, Anxiety, Cardiomyopathy, shoulder surgery.     PT Comments    Patient tolerated session well and with no c/o pain. Pt continues to need multimodal cues for maintaining TDWB status while ambulating. Pt verbalized understanding however has difficulty maintaining weightbearing status. Continue to progress as tolerated.    Follow Up Recommendations  Home health PT;Supervision for mobility/OOB     Equipment Recommendations  Rolling walker with 5" wheels    Recommendations for Other Services       Precautions / Restrictions Precautions Precautions: Fall Restrictions Weight Bearing Restrictions: Yes RLE Weight Bearing: Touchdown weight bearing    Mobility  Bed Mobility               General bed mobility comments: pt OOB in chair upon arrival  Transfers Overall transfer level: Needs assistance Equipment used: Rolling walker (2 wheeled) Transfers: Sit to/from Stand Sit to Stand: Supervision         General transfer comment: cues for safe hand placement when returning to sit in recliner; supervision for safety  Ambulation/Gait Ambulation/Gait assistance: Min guard Ambulation Distance (Feet): 140 Feet Assistive device: Rolling walker (2 wheeled) Gait Pattern/deviations: Step-through pattern;Decreased stride length;Trunk flexed Gait velocity: decreased   General Gait Details: vc and visual cues for maintaining TDWB R LE; pt continues to ambulate with more than TDWB despite cues and verbalizing understanding   Stairs            Wheelchair Mobility    Modified Rankin (Stroke Patients Only)       Balance Overall balance assessment: Needs assistance Sitting-balance support: Feet unsupported;No  upper extremity supported Sitting balance-Leahy Scale: Normal     Standing balance support: During functional activity;Bilateral upper extremity supported Standing balance-Leahy Scale: Fair                              Cognition Arousal/Alertness: Awake/alert Behavior During Therapy: WFL for tasks assessed/performed Overall Cognitive Status: No family/caregiver present to determine baseline cognitive functioning Area of Impairment: Memory                     Memory: Decreased recall of precautions;Decreased short-term memory         General Comments: pt continues to verbalize understanding of weightbearing status however does not follow cues for techniques to maintain weightbearing status      Exercises General Exercises - Lower Extremity Ankle Circles/Pumps: AROM;Both;20 reps Long Arc Quad: AROM;Both;Other reps (comment);Seated(25) Hip Flexion/Marching: AROM;Both;20 reps;Seated Amputee Exercises Chair Push Up: AROM;15 reps;Seated    General Comments        Pertinent Vitals/Pain Pain Assessment: No/denies pain    Home Living                      Prior Function            PT Goals (current goals can now be found in the care plan section) Acute Rehab PT Goals PT Goal Formulation: With patient/family Time For Goal Achievement: 05/16/17 Potential to Achieve Goals: Good Progress towards PT goals: Progressing toward goals    Frequency    Min 5X/week      PT Plan Current plan remains appropriate  Co-evaluation              AM-PAC PT "6 Clicks" Daily Activity  Outcome Measure  Difficulty turning over in bed (including adjusting bedclothes, sheets and blankets)?: None Difficulty moving from lying on back to sitting on the side of the bed? : A Little Difficulty sitting down on and standing up from a chair with arms (e.g., wheelchair, bedside commode, etc,.)?: A Little Help needed moving to and from a bed to chair  (including a wheelchair)?: A Little Help needed walking in hospital room?: A Little Help needed climbing 3-5 steps with a railing? : A Little 6 Click Score: 19    End of Session Equipment Utilized During Treatment: Gait belt Activity Tolerance: Patient tolerated treatment well Patient left: with call bell/phone within reach;in chair Nurse Communication: Mobility status PT Visit Diagnosis: Unsteadiness on feet (R26.81);Other abnormalities of gait and mobility (R26.89);Pain;Difficulty in walking, not elsewhere classified (R26.2) Pain - Right/Left: Right Pain - part of body: Ankle and joints of foot     Time: 4193-7902 PT Time Calculation (min) (ACUTE ONLY): 16 min  Charges:  $Gait Training: 8-22 mins                    G Codes:       Earney Navy, PTA Pager: [phone]     Darliss Cheney 05/13/2017, 11:01 AM

## 2017-05-13 NOTE — Discharge Summary (Signed)
Discharge Diagnoses:  Active Problems:   S/P transmetatarsal amputation of foot, right ([place])   Surgeries: Procedure(s): RIGHT TRANSMETATARSAL AMPUTATION on 05/09/2017    Consultants: Treatment Team:  Edrick Oh, MD  Discharged Condition: Improved  Hospital Course: [name]. is an 77 y.o. male who was admitted 05/09/2017 with a chief complaint of gangrene right foot, with a final diagnosis of Gangrene Right Foot.  Patient was brought to the operating room on 05/09/2017 and underwent Procedure(s): RIGHT TRANSMETATARSAL AMPUTATION.    Patient was given perioperative antibiotics:  Anti-infectives (From admission, onward)   Start     Dose/Rate Route Frequency Ordered Stop   05/09/17 2300  ceFAZolin (ANCEF) IVPB 1 g/50 mL premix     1 g 100 mL/hr over 30 Minutes Intravenous  Once 05/09/17 1459 05/09/17 2351   05/09/17 1800  ceFAZolin (ANCEF) IVPB 1 g/50 mL premix  Status:  Discontinued     1 g 100 mL/hr over 30 Minutes Intravenous Every 6 hours 05/09/17 1418 05/09/17 1459   05/09/17 0830  ceFAZolin (ANCEF) IVPB 2g/100 mL premix     2 g 200 mL/hr over 30 Minutes Intravenous On call to O.R. 05/09/17 [id number] 05/09/17 1225    .  Patient was given sequential compression devices, early ambulation, and aspirin for DVT prophylaxis.  Recent vital signs:  Patient Vitals for the past 24 hrs:  BP Temp Temp src Pulse Resp SpO2 Weight  05/13/17 0604 (!) 101/58 98.3 F (36.8 C) Oral 85 17 93 % -  05/12/17 2103 (!) 107/53 99.1 F (37.3 C) Oral 96 17 94 % -  05/12/17 1300 (!) 90/49 98 F (36.7 C) Oral 86 18 97 % -  05/12/17 1203 (!) 105/58 (!) 97.5 F (36.4 C) Oral 84 (!) 183 98 % 165 lb 12.6 oz (75.2 kg)  05/12/17 1130 (!) 109/52 - - (!) 54 - - -  05/12/17 1100 115/67 - - 80 - - -  05/12/17 1030 (!) 104/44 - - 85 - - -  05/12/17 1015 (!) 87/40 - - (!) 57 - - -  05/12/17 1000 (!) 94/36 - - (!) 52 - - -  05/12/17 0930 (!) 92/59 - - 79 - - -  05/12/17 0915 130/72 - - 80 - - -  05/12/17  0900 108/66 - - 78 - - -  05/12/17 0845 (!) 84/49 - - (!) 51 - - -  05/12/17 0830 (!) 94/55 - - (!) 51 - - -  05/12/17 0810 121/74 - - 82 - - -  05/12/17 0803 94/69 - - 67 - - -  05/12/17 0755 122/61 98.5 F (36.9 C) Oral 77 16 98 % 169 lb 12.1 oz (77 kg)  .  Recent laboratory studies: Ir Removal Tun Cv Cath W/o Fl  Result Date: 05/12/2017 CLINICAL DATA:  Renal failure. Left IJ tunneled hemodialysis catheter placed 12/10/2016 surgically. Fistula is not functioning for dialysis and catheter no longer needed. EXAM: TUNNELED HEMODIALYSIS CATHETER REMOVAL TECHNIQUE: The procedure, risks (including but not limited to bleeding, infection, organ damage ), benefits, and alternatives were explained to the patient. Questions regarding the procedure were encouraged and answered. The patient understands and consents to the procedure. Overlying skin prepped with chlorhexidine, draped in usual sterile fashion. The previously placed hemodialysis catheter was removed intact with gentle traction. Hemostasis was achieved. Site covered with a sterile dressing. The patient tolerated the procedure well. COMPLICATIONS: none IMPRESSION: 1. Technically successful left IJ tunneled hemodialysis catheter removal. Electronically Signed  By: Lucrezia Europe M.D.   On: 05/12/2017 16:24    Discharge Medications:   Allergies as of 05/13/2017      Reactions   Aspirin Other (See Comments)   INTERNAL BLEEDING      Medication List    TAKE these medications   ALPRAZolam 0.5 MG tablet Commonly known as:  XANAX Take 0.5 mg by mouth daily.   atorvastatin 40 MG tablet Commonly known as:  LIPITOR Take 40 mg by mouth daily.   b complex-vitamin c-folic acid 0.8 MG Tabs tablet Take 0.8 mg by mouth See admin instructions. Takes on Tuesdays, Thursdays, Saturdays, and Sundays. Does not take on Mondays, Wednesdays, and Fridays due to Dialysis treatments.   cinacalcet 60 MG tablet Commonly known as:  SENSIPAR Take 60 mg by mouth  every evening. With evening meal   HYDROcodone-acetaminophen 5-325 MG tablet Commonly known as:  NORCO/VICODIN Take 1 tablet by mouth every 4 (four) hours as needed for moderate pain.   meclizine 25 MG tablet Commonly known as:  ANTIVERT Take 25 mg by mouth daily as needed for dizziness (Non dialysis days).   ondansetron 8 MG tablet Commonly known as:  ZOFRAN Take 1 tablet (8 mg total) by mouth every 8 (eight) hours as needed for nausea or vomiting.   oxyCODONE-acetaminophen 5-325 MG tablet Commonly known as:  PERCOCET/ROXICET Take 1 tablet by mouth every 6 (six) hours as needed for severe pain.   promethazine 25 MG tablet Commonly known as:  PHENERGAN Take 25 mg by mouth every 8 (eight) hours as needed for nausea or vomiting.   sevelamer carbonate 800 MG tablet Commonly known as:  RENVELA Take 1,600-2,400 mg by mouth See admin instructions. Take 2400  mg by mouth 3 times daily with full meals and take 1600 mg by mouth with snacks.            Discharge Care Instructions  (From admission, onward)        Start     Ordered   05/13/17 0000  Touch down weight bearing    Question Answer Comment  Laterality right   Extremity Lower      05/13/17 0625   05/09/17 0000  Touch down weight bearing    Question Answer Comment  Laterality right   Extremity Lower      01 /11/19 1256      Diagnostic Studies: Ir Removal Tun Cv Cath W/o Fl  Result Date: 05/12/2017 CLINICAL DATA:  Renal failure. Left IJ tunneled hemodialysis catheter placed 12/10/2016 surgically. Fistula is not functioning for dialysis and catheter no longer needed. EXAM: TUNNELED HEMODIALYSIS CATHETER REMOVAL TECHNIQUE: The procedure, risks (including but not limited to bleeding, infection, organ damage ), benefits, and alternatives were explained to the patient. Questions regarding the procedure were encouraged and answered. The patient understands and consents to the procedure. Overlying skin prepped with  chlorhexidine, draped in usual sterile fashion. The previously placed hemodialysis catheter was removed intact with gentle traction. Hemostasis was achieved. Site covered with a sterile dressing. The patient tolerated the procedure well. COMPLICATIONS: none IMPRESSION: 1. Technically successful left IJ tunneled hemodialysis catheter removal. Electronically Signed   By: Lucrezia Europe M.D.   On: 05/12/2017 16:24    Patient benefited maximally from their hospital stay and there were no complications.     Disposition: 01-Home or Self Care Discharge Instructions    Call MD / Call 911   Complete by:  As directed    If you experience chest pain or shortness  of breath, CALL 911 and be transported to the hospital emergency room.  If you develope a fever above 101 F, pus (white drainage) or increased drainage or redness at the wound, or calf pain, call your surgeon's office.   Call MD / Call 911   Complete by:  As directed    If you experience chest pain or shortness of breath, CALL 911 and be transported to the hospital emergency room.  If you develope a fever above 101 F, pus (white drainage) or increased drainage or redness at the wound, or calf pain, call your surgeon's office.   Constipation Prevention   Complete by:  As directed    Drink plenty of fluids.  Prune juice may be helpful.  You may use a stool softener, such as Colace (over the counter) 100 mg twice a day.  Use MiraLax (over the counter) for constipation as needed.   Constipation Prevention   Complete by:  As directed    Drink plenty of fluids.  Prune juice may be helpful.  You may use a stool softener, such as Colace (over the counter) 100 mg twice a day.  Use MiraLax (over the counter) for constipation as needed.   Diet - low sodium heart healthy   Complete by:  As directed    Diet - low sodium heart healthy   Complete by:  As directed    Increase activity slowly as tolerated   Complete by:  As directed    Increase activity slowly as  tolerated   Complete by:  As directed    Negative Pressure Wound Therapy - Incisional   Complete by:  As directed    Continue wound VAC for 1 week.  Plug the unit in to ensure that it stays charged.  If the wound VAC stops working remove the dressing and the sponge, apply dry dressing and call the office for follow-up.  Plan to follow-up next week.   Negative Pressure Wound Therapy - Incisional   Complete by:  As directed    Continue VAC for 1 week then D/C, remove VAC dressing, apply dry dressing   Touch down weight bearing   Complete by:  As directed    Laterality:  right   Extremity:  Lower   Touch down weight bearing   Complete by:  As directed    Laterality:  right   Extremity:  Lower      Contact information for follow-up providers    [name], MD Follow up in 1 week(s).   Specialty:  Orthopedic Surgery Contact information: [place] [address] [phone]            Contact information for after-discharge care    [facility] Follow up.   Service:  Skilled Nursing Contact information: [address] [place] [phone]                   Signed: [name] 05/13/2017, 6:26 AM

## 2017-05-13 NOTE — Care Management Important Message (Signed)
Important Message  Patient Details  Name: Joseph MOLESWORTH Sr. MRN: [id number] Date of Birth: May 24, 1940   Medicare Important Message Given:  Yes    Arien Morine Montine Circle 05/13/2017, 12:59 PM

## 2017-05-13 NOTE — Social Work (Signed)
Clinical Social Worker facilitated patient discharge including contacting patient family and facility to confirm patient discharge plans.  Clinical information faxed to facility and family agreeable with plan.    CSW arranged ambulance transport via [facility] to [facility].    RN to call [phone] to give report prior to discharge.  Clinical Social Worker will sign off for now as social work intervention is no longer needed. Please consult [place] again if new need arises.  Elissa Hefty, LCSW Clinical Social Worker [phone]

## 2017-05-13 NOTE — Clinical Social Work Placement (Signed)
   CLINICAL SOCIAL WORK PLACEMENT  NOTE  Date:  05/13/2017  Patient Details  Name: Joseph POLANCO Sr. MRN: [id number] Date of Birth: [birth year]  Clinical Social Work is seeking post-discharge placement for this patient at the [place] level of care (*CSW will initial, date and re-position this form in  chart as items are completed):  Yes   Patient/family provided with [facility] Work Department's list of facilities offering this level of care within the geographic area requested by the patient (or if unable, by the patient's family).  Yes   Patient/family informed of their freedom to choose among providers that offer the needed level of care, that participate in Medicare, Medicaid or managed care program needed by the patient, have an available bed and are willing to accept the patient.  Yes   Patient/family informed of [facility]'s ownership interest in [facility] and [facility], as well as of the fact that they are under no obligation to receive care at these facilities.  PASRR submitted to EDS on       PASRR number received on 05/11/17     Existing PASRR number confirmed on       FL2 transmitted to all facilities in geographic area requested by pt/family on 05/11/17     FL2 transmitted to all facilities within larger geographic area on       Patient informed that his/her managed care company has contracts with or will negotiate with certain facilities, including the following:        Yes   Patient/family informed of bed offers received.  Patient chooses bed at [facility]     Physician recommends and patient chooses bed at      Patient to be transferred to [facility] on 05/13/17.  Patient to be transferred to facility by [facility]     Patient family notified on 05/13/17 of transfer.  Name of family member notified:  pt responsible for self     PHYSICIAN       Additional Comment:     _______________________________________________ Normajean Baxter, LCSW 05/13/2017, 9:41 AM

## 2017-05-14 ENCOUNTER — Encounter: Payer: Self-pay | Admitting: Vascular Surgery

## 2017-05-14 DIAGNOSIS — Z992 Dependence on renal dialysis: Secondary | ICD-10-CM | POA: Diagnosis not present

## 2017-05-14 DIAGNOSIS — N2581 Secondary hyperparathyroidism of renal origin: Secondary | ICD-10-CM | POA: Diagnosis not present

## 2017-05-14 DIAGNOSIS — N186 End stage renal disease: Secondary | ICD-10-CM | POA: Diagnosis not present

## 2017-05-14 DIAGNOSIS — D509 Iron deficiency anemia, unspecified: Secondary | ICD-10-CM | POA: Diagnosis not present

## 2017-05-16 DIAGNOSIS — N186 End stage renal disease: Secondary | ICD-10-CM | POA: Diagnosis not present

## 2017-05-16 DIAGNOSIS — N2581 Secondary hyperparathyroidism of renal origin: Secondary | ICD-10-CM | POA: Diagnosis not present

## 2017-05-16 DIAGNOSIS — D509 Iron deficiency anemia, unspecified: Secondary | ICD-10-CM | POA: Diagnosis not present

## 2017-05-16 DIAGNOSIS — Z992 Dependence on renal dialysis: Secondary | ICD-10-CM | POA: Diagnosis not present

## 2017-05-19 DIAGNOSIS — N186 End stage renal disease: Secondary | ICD-10-CM | POA: Diagnosis not present

## 2017-05-19 DIAGNOSIS — Z992 Dependence on renal dialysis: Secondary | ICD-10-CM | POA: Diagnosis not present

## 2017-05-19 DIAGNOSIS — D509 Iron deficiency anemia, unspecified: Secondary | ICD-10-CM | POA: Diagnosis not present

## 2017-05-19 DIAGNOSIS — N2581 Secondary hyperparathyroidism of renal origin: Secondary | ICD-10-CM | POA: Diagnosis not present

## 2017-05-21 DIAGNOSIS — N186 End stage renal disease: Secondary | ICD-10-CM | POA: Diagnosis not present

## 2017-05-21 DIAGNOSIS — Z992 Dependence on renal dialysis: Secondary | ICD-10-CM | POA: Diagnosis not present

## 2017-05-21 DIAGNOSIS — N2581 Secondary hyperparathyroidism of renal origin: Secondary | ICD-10-CM | POA: Diagnosis not present

## 2017-05-21 DIAGNOSIS — D509 Iron deficiency anemia, unspecified: Secondary | ICD-10-CM | POA: Diagnosis not present

## 2017-05-22 ENCOUNTER — Encounter (INDEPENDENT_AMBULATORY_CARE_PROVIDER_SITE_OTHER): Payer: Self-pay | Admitting: Orthopedic Surgery

## 2017-05-22 ENCOUNTER — Ambulatory Visit (INDEPENDENT_AMBULATORY_CARE_PROVIDER_SITE_OTHER): Payer: Medicare Other | Admitting: Orthopedic Surgery

## 2017-05-22 VITALS — Ht 65.0 in | Wt 165.0 lb

## 2017-05-22 DIAGNOSIS — Z89431 Acquired absence of right foot: Secondary | ICD-10-CM

## 2017-05-22 NOTE — Progress Notes (Signed)
Post-Op Visit Note   Patient: Joseph ELLENDER Sr.           Date of Birth: Nov 09, 1940           MRN: [id number] Visit Date: 05/22/2017 PCP: [name], MD  Chief Complaint:  Chief Complaint  Patient presents with  . Right Foot - Routine Post Op    05/09/17 right transmet amputation     HPI:  HPI The patient is a 77 year old gentleman seen 13 days status post right transmetatarsal amputation. Some bloody drainage. Post op shoe in place. Is nonweight bearing.  Ortho Exam Incision well approximated with sutures. Healing well. No gaping. No erythema, drainage. No sign of infection. Minimal swelling.  Visit Diagnoses:  1. S/P transmetatarsal amputation of foot, right ([place])     Plan: advised to elevate. Begin daily dial soap cleansing. Dry dressings. Continue nonweight bearing. Follow up in 1 week for suture removal.   Follow-Up Instructions: No Follow-up on file.   Imaging: No results found.  Orders:  No orders of the defined types were placed in this encounter.  No orders of the defined types were placed in this encounter.    PMFS History: Patient Active Problem List   Diagnosis Date Noted  . S/P transmetatarsal amputation of foot, right ([place]) 05/09/2017  . PVD (peripheral vascular disease) ([place]) 04/15/2017  . Nonischemic cardiomyopathy ([place]) 08/15/2015  . Aortic stenosis 08/15/2015  . Moderate aortic stenosis 07/12/2015  . 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 CAD- 40-50% LAD at cath 07/11/15 07/11/2015  . Aspirin intolerance 07/11/2015  . Abnormal stress test   . ESRD on dialysis ([place]) 05/30/2015  . CTS (carpal tunnel syndrome) 02/28/2015  . PVD of LE - Dr Oneida Alar follows 08/20/2012  . Ulcer of foot ([place]) 01/16/2012  . Encounter for screening colonoscopy 10/08/2011  . Melena 10/08/2011  . Other complications due to renal dialysis device, implant, and graft 09/24/2011   Past Medical History:  Diagnosis Date  . Anxiety   . Aortic stenosis   .  Arthritis   . CVA (cerebral infarction)   . ESRD (end stage renal disease) on dialysis [facility])    M/W/F at [facility] in [place]  . Essential hypertension   . Gangrene of right foot ([place])   . Gastric ulcer 2004  . History of cardiomyopathy    LVEF normal as of February 2017  . History of stroke    Left side weakness  . Iron deficiency anemia     Family History  Problem Relation Age of Onset  . Hypertension Mother   . Colon cancer Neg Hx   . Liver disease Neg Hx     Past Surgical History:  Procedure Laterality Date  . ABDOMINAL AORTAGRAM N/A 01/24/2012   Procedure: ABDOMINAL Maxcine Ham;  Surgeon: Elam Dutch, MD;  Location: [facility] CATH LAB;  Service: Cardiovascular;  Laterality: N/A;  . ABDOMINAL AORTOGRAM W/LOWER EXTREMITY N/A 04/15/2017   Procedure: ABDOMINAL AORTOGRAM W/LOWER EXTREMITY;  Surgeon: Angelia Mould, MD;  Location: [place] CV LAB;  Service: Cardiovascular;  Laterality: N/A;  . AMPUTATION Right 05/09/2017   Procedure: RIGHT TRANSMETATARSAL AMPUTATION;  Surgeon: [name], MD;  Location: [place];  Service: Orthopedics;  Laterality: Right;  . ARTERIOVENOUS GRAFT PLACEMENT Right right arm  . AV FISTULA PLACEMENT Left 08/31/2015   Procedure: ARTERIOVENOUS (AV) FISTULA CREATION- LEFT RADIOCEPHALIC;  Surgeon: Mal Misty, MD;  Location: [place];  Service: Vascular;  Laterality: Left;  . AV  FISTULA PLACEMENT Right 02/18/2017   Procedure: INSERTION OF ARTERIOVENOUS (AV) GORE-TEX GRAFT  RIGHT UPPER ARM;  Surgeon: Conrad [place]le [place], MD;  Location: [place];  Service: Vascular;  Laterality: Right;  . BASCILIC VEIN TRANSPOSITION Left 12/19/2015   Procedure: FIRST STAGE BRACHIAL VEIN TRANSPOSITION;  Surgeon: Conrad [place], MD;  Location: [place];  Service: Vascular;  Laterality: Left;  . BASCILIC VEIN TRANSPOSITION Left 02/08/2016   Procedure: SECOND STAGE BRACHIAL VEIN TRANSPOSITION;  Surgeon: Conrad Bartholomew, MD;  Location: [employer];  Service: Vascular;  Laterality: Left;  . CARDIAC  CATHETERIZATION N/A 07/11/2015   Procedure: Left Heart Cath and Coronary Angiography;  Surgeon: Troy Sine, MD;  Location: [place] CV LAB;  Service: Cardiovascular;  Laterality: N/A;  . Carpel Tunnel Left Dec. 22, 2016  . CHOLECYSTECTOMY    . COLONOSCOPY  2004   Dr. Irving Shows, left sided diverticula and cecal polyp, path unknown  . COLONOSCOPY  10/29/2011   Procedure: COLONOSCOPY;  Surgeon: Daneil Dolin, MD;  [facility];  Service: Endoscopy;  Laterality: N/A;  10:15  . ESOPHAGOGASTRODUODENOSCOPY  11/2002   Dr. Gala Romney, erosive reflux esophagitis, multiple gastric ulcer and antral/bulbar erosions. Serologies positive for H.Pylori and was treated  . ESOPHAGOGASTRODUODENOSCOPY  11/20014   Dr. Gala Romney, small hh only, ulcers healed  . ESOPHAGOGASTRODUODENOSCOPY  09/21/2011   Dr Trevor Iha [facility], antral erosions, ?early GAVE  . FISTULOGRAM Left 12/10/2016   Procedure: THROMBECTOMY OF LEFT ARM ARTERIOVENOUS FISTULA;  Surgeon: Waynetta Sandy, MD;  Location: [place];  Service: Vascular;  Laterality: Left;  . INSERTION OF DIALYSIS CATHETER Left 12/10/2016   Procedure: INSERTION OF TUNNELED DIALYSIS CATHETER;  Surgeon: Waynetta Sandy, MD;  Location: [place];  Service: Vascular;  Laterality: Left;  . IR GENERIC HISTORICAL  07/16/2016   IR REMOVAL TUN CV CATH W/O FL 07/16/2016 Saverio Danker, PA-C [facility]-INTERV RAD  . IR REMOVAL TUN CV CATH W/O FL  05/12/2017  . LIGATION OF ARTERIOVENOUS  FISTULA Left 12/19/2015   Procedure: LIGATION OF RADIOCEPHALIC ARTERIOVENOUS  FISTULA;  Surgeon: Conrad Belle Rive, MD;  Location: [place];  Service: Vascular;  Laterality: Left;  Marland Kitchen MASS EXCISION Right 02/18/2017   Procedure: EXCISION OF RIGHT AXILLARY EPIDERMAL INCLUSION CYST;  Surgeon: Conrad Maysville, MD;  Location: [place];  Service: Vascular;  Laterality: Right;  . PERIPHERAL VASCULAR CATHETERIZATION N/A 12/14/2015   Procedure: Fistulagram;  Surgeon: Conrad , MD;  Location: [place] CV LAB;   Service: Cardiovascular;  Laterality: N/A;  . SHOULDER SURGERY    . ULTRASOUND GUIDANCE FOR VASCULAR ACCESS  04/15/2017   Procedure: Ultrasound Guidance For Vascular Access;  Surgeon: Angelia Mould, MD;  Location: [place] CV LAB;  Service: Cardiovascular;;  . UPPER EXTREMITY VENOGRAPHY Bilateral 12/17/2016   Procedure: Bilateral Upper Extremity Venography;  Surgeon: Serafina Mitchell, MD;  Location: [place] CV LAB;  Service: Cardiovascular;  Laterality: Bilateral;   Social History   Occupational History  . Occupation: retired, Licensed conveyancer    Employer: RETIRED  Tobacco Use  . Smoking status: Former Smoker    Last attempt to quit: 04/29/2004    Years since quitting: 13.0  . Smokeless tobacco: Former Systems developer    Types: Chew    Quit date: 01/16/1987  . Tobacco comment: quit 2006  Substance and Sexual Activity  . Alcohol use: No  . Drug use: No  . Sexual activity: Not on file

## 2017-05-23 DIAGNOSIS — N186 End stage renal disease: Secondary | ICD-10-CM | POA: Diagnosis not present

## 2017-05-23 DIAGNOSIS — Z992 Dependence on renal dialysis: Secondary | ICD-10-CM | POA: Diagnosis not present

## 2017-05-23 DIAGNOSIS — D509 Iron deficiency anemia, unspecified: Secondary | ICD-10-CM | POA: Diagnosis not present

## 2017-05-23 DIAGNOSIS — N2581 Secondary hyperparathyroidism of renal origin: Secondary | ICD-10-CM | POA: Diagnosis not present

## 2017-05-26 DIAGNOSIS — D509 Iron deficiency anemia, unspecified: Secondary | ICD-10-CM | POA: Diagnosis not present

## 2017-05-26 DIAGNOSIS — Z992 Dependence on renal dialysis: Secondary | ICD-10-CM | POA: Diagnosis not present

## 2017-05-26 DIAGNOSIS — N2581 Secondary hyperparathyroidism of renal origin: Secondary | ICD-10-CM | POA: Diagnosis not present

## 2017-05-26 DIAGNOSIS — N186 End stage renal disease: Secondary | ICD-10-CM | POA: Diagnosis not present

## 2017-05-28 DIAGNOSIS — Z992 Dependence on renal dialysis: Secondary | ICD-10-CM | POA: Diagnosis not present

## 2017-05-28 DIAGNOSIS — D509 Iron deficiency anemia, unspecified: Secondary | ICD-10-CM | POA: Diagnosis not present

## 2017-05-28 DIAGNOSIS — N186 End stage renal disease: Secondary | ICD-10-CM | POA: Diagnosis not present

## 2017-05-28 DIAGNOSIS — N2581 Secondary hyperparathyroidism of renal origin: Secondary | ICD-10-CM | POA: Diagnosis not present

## 2017-05-29 ENCOUNTER — Ambulatory Visit (INDEPENDENT_AMBULATORY_CARE_PROVIDER_SITE_OTHER): Payer: Medicare Other | Admitting: Family

## 2017-05-29 ENCOUNTER — Encounter (INDEPENDENT_AMBULATORY_CARE_PROVIDER_SITE_OTHER): Payer: Self-pay | Admitting: Family

## 2017-05-29 VITALS — Ht 65.0 in | Wt 165.0 lb

## 2017-05-29 DIAGNOSIS — Z992 Dependence on renal dialysis: Secondary | ICD-10-CM | POA: Diagnosis not present

## 2017-05-29 DIAGNOSIS — N186 End stage renal disease: Secondary | ICD-10-CM | POA: Diagnosis not present

## 2017-05-29 DIAGNOSIS — Z89431 Acquired absence of right foot: Secondary | ICD-10-CM

## 2017-05-29 NOTE — Progress Notes (Signed)
Post-Op Visit Note   Patient: Joseph SIMPSON Sr.           Date of Birth: 1940/12/17           MRN: [id number] Visit Date: 05/29/2017 PCP: [name], MD  Chief Complaint:  Chief Complaint  Patient presents with  . Right Foot - Routine Post Op    05/09/17 right foot transmet amputation     HPI:  HPI The patient is a 77 year old gentleman seen status post right transmetatarsal amputation on 05/09/17. Some bloody drainage. Post op shoe in place.   States has been walking full weight bearing in his bedroom and around the rehab facility.  Ortho Exam Incision approximated with sutures. Healing slowly, has gaped a few mm. Exudative tissue in wound bed. Some dried blood. No purulence. No sign of infection. Minimal swelling.  Visit Diagnoses:  1. S/P transmetatarsal amputation of foot, right ([place])     Plan: advised to elevate. Begin daily dial soap cleansing. Dry dressings. Please weight bearing through heel only for transfers. Follow up in 2 week for suture removal.   Follow-Up Instructions: No Follow-up on file.   Imaging: No results found.  Orders:  No orders of the defined types were placed in this encounter.  No orders of the defined types were placed in this encounter.    PMFS History: Patient Active Problem List   Diagnosis Date Noted  . S/P transmetatarsal amputation of foot, right ([place]) 05/09/2017  . PVD (peripheral vascular disease) ([place]) 04/15/2017  . Nonischemic cardiomyopathy ([place]) 08/15/2015  . Aortic stenosis 08/15/2015  . Moderate aortic stenosis 07/12/2015  . 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 CAD- 40-50% LAD at cath 07/11/15 07/11/2015  . Aspirin intolerance 07/11/2015  . Abnormal stress test   . ESRD on dialysis ([place]) 05/30/2015  . CTS (carpal tunnel syndrome) 02/28/2015  . PVD of LE - Dr Oneida Alar follows 08/20/2012  . Ulcer of foot ([place]) 01/16/2012  . Encounter for screening colonoscopy 10/08/2011  . Melena 10/08/2011  . Other  complications due to renal dialysis device, implant, and graft 09/24/2011   Past Medical History:  Diagnosis Date  . Anxiety   . Aortic stenosis   . Arthritis   . CVA (cerebral infarction)   . ESRD (end stage renal disease) on dialysis [facility])    M/W/F at [facility] in [place]  . Essential hypertension   . Gangrene of right foot ([place])   . Gastric ulcer 2004  . History of cardiomyopathy    LVEF normal as of February 2017  . History of stroke    Left side weakness  . Iron deficiency anemia     Family History  Problem Relation Age of Onset  . Hypertension Mother   . Colon cancer Neg Hx   . Liver disease Neg Hx     Past Surgical History:  Procedure Laterality Date  . ABDOMINAL AORTAGRAM N/A 01/24/2012   Procedure: ABDOMINAL Maxcine Ham;  Surgeon: Elam Dutch, MD;  Location: [facility] CATH LAB;  Service: Cardiovascular;  Laterality: N/A;  . ABDOMINAL AORTOGRAM W/LOWER EXTREMITY N/A 04/15/2017   Procedure: ABDOMINAL AORTOGRAM W/LOWER EXTREMITY;  Surgeon: Angelia Mould, MD;  Location: [place] CV LAB;  Service: Cardiovascular;  Laterality: N/A;  . AMPUTATION Right 05/09/2017   Procedure: RIGHT TRANSMETATARSAL AMPUTATION;  Surgeon: [name], MD;  Location: [place];  Service: Orthopedics;  Laterality: Right;  . ARTERIOVENOUS GRAFT PLACEMENT Right right arm  . AV FISTULA PLACEMENT Left  08/31/2015   Procedure: ARTERIOVENOUS (AV) FISTULA CREATION- LEFT RADIOCEPHALIC;  Surgeon: Mal Misty, MD;  Location: [place];  Service: Vascular;  Laterality: Left;  . AV FISTULA PLACEMENT Right 02/18/2017   Procedure: INSERTION OF ARTERIOVENOUS (AV) GORE-TEX GRAFT  RIGHT UPPER ARM;  Surgeon: Conrad Ladora, MD;  Location: [place];  Service: Vascular;  Laterality: Right;  . BASCILIC VEIN TRANSPOSITION Left 12/19/2015   Procedure: FIRST STAGE BRACHIAL VEIN TRANSPOSITION;  Surgeon: Conrad Durant, MD;  Location: [place];  Service: Vascular;  Laterality: Left;  . BASCILIC VEIN TRANSPOSITION Left 02/08/2016     Procedure: SECOND STAGE BRACHIAL VEIN TRANSPOSITION;  Surgeon: Conrad [place], MD;  Location: [place];  Service: Vascular;  Laterality: Left;  . CARDIAC CATHETERIZATION N/A 07/11/2015   Procedure: Left Heart Cath and Coronary Angiography;  Surgeon: Troy Sine, MD;  Location: [place] CV LAB;  Service: Cardiovascular;  Laterality: N/A;  . Carpel Tunnel Left Dec. 22, 2016  . CHOLECYSTECTOMY    . COLONOSCOPY  2004   Dr. Irving Shows, left sided diverticula and cecal polyp, path unknown  . COLONOSCOPY  10/29/2011   Procedure: COLONOSCOPY;  Surgeon: Daneil Dolin, MD;  [facility];  Service: Endoscopy;  Laterality: N/A;  10:15  . ESOPHAGOGASTRODUODENOSCOPY  11/2002   Dr. Gala Romney, erosive reflux esophagitis, multiple gastric ulcer and antral/bulbar erosions. Serologies positive for H.Pylori and was treated  . ESOPHAGOGASTRODUODENOSCOPY  11/20014   Dr. Gala Romney, small hh only, ulcers healed  . ESOPHAGOGASTRODUODENOSCOPY  09/21/2011   Dr Trevor Iha HH, antral erosions, ?early GAVE  . FISTULOGRAM Left 12/10/2016   Procedure: THROMBECTOMY OF LEFT ARM ARTERIOVENOUS FISTULA;  Surgeon: Waynetta Sandy, MD;  Location: [place];  Service: Vascular;  Laterality: Left;  . INSERTION OF DIALYSIS CATHETER Left 12/10/2016   Procedure: INSERTION OF TUNNELED DIALYSIS CATHETER;  Surgeon: Waynetta Sandy, MD;  Location: [place];  Service: Vascular;  Laterality: Left;  . IR GENERIC HISTORICAL  07/16/2016   IR REMOVAL TUN CV CATH W/O FL 07/16/2016 Saverio Danker, PA-C [facility]INTER[facility] RAD  . IR REMOVAL TUN CV CATH W/O FL  05/12/2017  . LIGATION OF ARTERIOVENOUS  FISTULA Left 12/19/2015   Procedure: LIGATION OF RADIOCEPHALIC ARTERIOVENOUS  FISTULA;  Surgeon: Conrad [place], MD;  Location: [place];  Service: Vascular;  Laterality: Left;  Marland Kitchen MASS EXCISION Right 02/18/2017   Procedure: EXCISION OF RIGHT AXILLARY EPIDERMAL INCLUSION CYST;  Surgeon: Conrad [place], MD;  Location: [place];  Service: Vascular;  Laterality:  Right;  . PERIPHERAL VASCULAR CATHETERIZATION N/A 12/14/2015   Procedure: Fistulagram;  Surgeon: Conrad , MD;  Location: [place] CV LAB;  Service: Cardiovascular;  Laterality: N/A;  . SHOULDER SURGERY    . ULTRASOUND GUIDANCE FOR VASCULAR ACCESS  04/15/2017   Procedure: Ultrasound Guidance For Vascular Access;  Surgeon: Angelia Mould, MD;  Location: [place] CV LAB;  Service: Cardiovascular;;  . UPPER EXTREMITY VENOGRAPHY Bilateral 12/17/2016   Procedure: Bilateral Upper Extremity Venography;  Surgeon: Serafina Mitchell, MD;  Location: [place] CV LAB;  Service: Cardiovascular;  Laterality: Bilateral;   Social History   Occupational History  . Occupation: retired, Licensed conveyancer    Employer: RETIRED  Tobacco Use  . Smoking status: Former Smoker    Last attempt to quit: 04/29/2004    Years since quitting: 13.0  . Smokeless tobacco: Former Systems developer    Types: Chew    Quit date: 01/16/1987  . Tobacco comment: quit 2006  Substance  and Sexual Activity  . Alcohol use: No  . Drug use: No  . Sexual activity: Not on file

## 2017-05-30 DIAGNOSIS — N2581 Secondary hyperparathyroidism of renal origin: Secondary | ICD-10-CM | POA: Diagnosis not present

## 2017-05-30 DIAGNOSIS — D509 Iron deficiency anemia, unspecified: Secondary | ICD-10-CM | POA: Diagnosis not present

## 2017-05-30 DIAGNOSIS — Z992 Dependence on renal dialysis: Secondary | ICD-10-CM | POA: Diagnosis not present

## 2017-05-30 DIAGNOSIS — N186 End stage renal disease: Secondary | ICD-10-CM | POA: Diagnosis not present

## 2017-06-02 DIAGNOSIS — D509 Iron deficiency anemia, unspecified: Secondary | ICD-10-CM | POA: Diagnosis not present

## 2017-06-02 DIAGNOSIS — N186 End stage renal disease: Secondary | ICD-10-CM | POA: Diagnosis not present

## 2017-06-02 DIAGNOSIS — N2581 Secondary hyperparathyroidism of renal origin: Secondary | ICD-10-CM | POA: Diagnosis not present

## 2017-06-02 DIAGNOSIS — Z992 Dependence on renal dialysis: Secondary | ICD-10-CM | POA: Diagnosis not present

## 2017-06-04 DIAGNOSIS — N2581 Secondary hyperparathyroidism of renal origin: Secondary | ICD-10-CM | POA: Diagnosis not present

## 2017-06-04 DIAGNOSIS — N186 End stage renal disease: Secondary | ICD-10-CM | POA: Diagnosis not present

## 2017-06-04 DIAGNOSIS — Z992 Dependence on renal dialysis: Secondary | ICD-10-CM | POA: Diagnosis not present

## 2017-06-04 DIAGNOSIS — D509 Iron deficiency anemia, unspecified: Secondary | ICD-10-CM | POA: Diagnosis not present

## 2017-06-06 DIAGNOSIS — N2581 Secondary hyperparathyroidism of renal origin: Secondary | ICD-10-CM | POA: Diagnosis not present

## 2017-06-06 DIAGNOSIS — Z992 Dependence on renal dialysis: Secondary | ICD-10-CM | POA: Diagnosis not present

## 2017-06-06 DIAGNOSIS — N186 End stage renal disease: Secondary | ICD-10-CM | POA: Diagnosis not present

## 2017-06-06 DIAGNOSIS — D509 Iron deficiency anemia, unspecified: Secondary | ICD-10-CM | POA: Diagnosis not present

## 2017-06-09 DIAGNOSIS — Z992 Dependence on renal dialysis: Secondary | ICD-10-CM | POA: Diagnosis not present

## 2017-06-09 DIAGNOSIS — N186 End stage renal disease: Secondary | ICD-10-CM | POA: Diagnosis not present

## 2017-06-09 DIAGNOSIS — N2581 Secondary hyperparathyroidism of renal origin: Secondary | ICD-10-CM | POA: Diagnosis not present

## 2017-06-09 DIAGNOSIS — D509 Iron deficiency anemia, unspecified: Secondary | ICD-10-CM | POA: Diagnosis not present

## 2017-06-11 DIAGNOSIS — N186 End stage renal disease: Secondary | ICD-10-CM | POA: Diagnosis not present

## 2017-06-11 DIAGNOSIS — D509 Iron deficiency anemia, unspecified: Secondary | ICD-10-CM | POA: Diagnosis not present

## 2017-06-11 DIAGNOSIS — Z992 Dependence on renal dialysis: Secondary | ICD-10-CM | POA: Diagnosis not present

## 2017-06-11 DIAGNOSIS — N2581 Secondary hyperparathyroidism of renal origin: Secondary | ICD-10-CM | POA: Diagnosis not present

## 2017-06-12 ENCOUNTER — Encounter (INDEPENDENT_AMBULATORY_CARE_PROVIDER_SITE_OTHER): Payer: Self-pay | Admitting: Orthopedic Surgery

## 2017-06-12 ENCOUNTER — Ambulatory Visit (INDEPENDENT_AMBULATORY_CARE_PROVIDER_SITE_OTHER): Payer: Medicare Other | Admitting: Orthopedic Surgery

## 2017-06-12 VITALS — Ht 65.0 in | Wt 165.0 lb

## 2017-06-12 DIAGNOSIS — Z89431 Acquired absence of right foot: Secondary | ICD-10-CM

## 2017-06-12 MED ORDER — MUPIROCIN 2 % EX OINT: 1.0000 "application " | TOPICAL_OINTMENT | Freq: Every day | CUTANEOUS | 6 refills | Status: DC

## 2017-06-12 MED ORDER — NITROGLYCERIN 0.2 MG/HR TD PT24: 0.2000 mg | MEDICATED_PATCH | Freq: Every day | TRANSDERMAL | 12 refills | Status: DC

## 2017-06-12 NOTE — Progress Notes (Signed)
Post-Op Visit Note   Patient: Joseph HSIUNG Sr.           Date of Birth: [birth year]           MRN: [id number] Visit Date: 06/12/2017 PCP: [name], MD  Chief Complaint:  Chief Complaint  Patient presents with  . Right Foot - Routine Post Op    05/09/17 right transmet amputation     HPI:  HPI The patient is a 77 year old gentleman seen status post right transmetatarsal amputation on 05/09/17. Some bloody drainage. Post op shoe in place.   States has been trying to minimize his weight bearing but does ambulated in facility.  Ortho Exam Incision approximated with sutures. Healing slowly, has gaped medially, maceration medially along incision. No drainage today. No purulence. No sign of infection. Minimal swelling.  Visit Diagnoses:  No diagnosis found.  Plan: sutures harvested today. advised to elevate. Continue daily dial soap cleansing. Mupirocin dressings. Will begin Nitroglycerin patch to improve microcirculation. Please weight bearing through heel only for transfers. Follow up in 2 week for re evaluation. Discussed fear of impending dehiscence, consequence of nonhealing and possible need for further amputation surgery.   Follow-Up Instructions: No Follow-up on file.   Imaging: No results found.  Orders:  No orders of the defined types were placed in this encounter.  No orders of the defined types were placed in this encounter.    PMFS History: Patient Active Problem List   Diagnosis Date Noted  . S/P transmetatarsal amputation of foot, right ([place]) 05/09/2017  . PVD (peripheral vascular disease) ([place]) 04/15/2017  . Nonischemic cardiomyopathy ([place]) 08/15/2015  . Aortic stenosis 08/15/2015  . Moderate aortic stenosis 07/12/2015  . 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 CAD- 40-50% LAD at cath 07/11/15 07/11/2015  . Aspirin intolerance 07/11/2015  . Abnormal stress test   . ESRD on dialysis ([place]) 05/30/2015  . CTS (carpal tunnel syndrome)  02/28/2015  . PVD of LE - Dr Oneida Alar follows 08/20/2012  . Ulcer of foot ([place]) 01/16/2012  . Encounter for screening colonoscopy 10/08/2011  . Melena 10/08/2011  . Other complications due to renal dialysis device, implant, and graft 09/24/2011   Past Medical History:  Diagnosis Date  . Anxiety   . Aortic stenosis   . Arthritis   . CVA (cerebral infarction)   . ESRD (end stage renal disease) on dialysis [facility])    M/W/F at [facility] in [place]  . Essential hypertension   . Gangrene of right foot ([place])   . Gastric ulcer 2004  . History of cardiomyopathy    LVEF normal as of February 2017  . History of stroke    Left side weakness  . Iron deficiency anemia     Family History  Problem Relation Age of Onset  . Hypertension Mother   . Colon cancer Neg Hx   . Liver disease Neg Hx     Past Surgical History:  Procedure Laterality Date  . ABDOMINAL AORTAGRAM N/A 01/24/2012   Procedure: ABDOMINAL Maxcine Ham;  Surgeon: Elam Dutch, MD;  Location: [facility] CATH LAB;  Service: Cardiovascular;  Laterality: N/A;  . ABDOMINAL AORTOGRAM W/LOWER EXTREMITY N/A 04/15/2017   Procedure: ABDOMINAL AORTOGRAM W/LOWER EXTREMITY;  Surgeon: Angelia Mould, MD;  Location: [place] CV LAB;  Service: Cardiovascular;  Laterality: N/A;  . AMPUTATION Right 05/09/2017   Procedure: RIGHT TRANSMETATARSAL AMPUTATION;  Surgeon: [name], MD;  Location: [place];  Service: Orthopedics;  Laterality: Right;  .  ARTERIOVENOUS GRAFT PLACEMENT Right right arm  . AV FISTULA PLACEMENT Left 08/31/2015   Procedure: ARTERIOVENOUS (AV) FISTULA CREATION- LEFT RADIOCEPHALIC;  Surgeon: Mal Misty, MD;  Location: [place];  Service: Vascular;  Laterality: Left;  . AV FISTULA PLACEMENT Right 02/18/2017   Procedure: INSERTION OF ARTERIOVENOUS (AV) GORE-TEX GRAFT  RIGHT UPPER ARM;  Surgeon: Conrad Tilden, MD;  Location: [place];  Service: Vascular;  Laterality: Right;  . BASCILIC VEIN TRANSPOSITION Left 12/19/2015   Procedure:  FIRST STAGE BRACHIAL VEIN TRANSPOSITION;  Surgeon: Conrad [place], MD;  Location: [place];  Service: Vascular;  Laterality: Left;  . BASCILIC VEIN TRANSPOSITION Left 02/08/2016   Procedure: SECOND STAGE BRACHIAL VEIN TRANSPOSITION;  Surgeon: Conrad Odell, MD;  Location: [place];  Service: Vascular;  Laterality: Left;  . CARDIAC CATHETERIZATION N/A 07/11/2015   Procedure: Left Heart Cath and Coronary Angiography;  Surgeon: Troy Sine, MD;  Location: [place] CV LAB;  Service: Cardiovascular;  Laterality: N/A;  . Carpel Tunnel Left Dec. 22, 2016  . CHOLECYSTECTOMY    . COLONOSCOPY  2004   Dr. Irving Shows, left sided diverticula and cecal polyp, path unknown  . COLONOSCOPY  10/29/2011   Procedure: COLONOSCOPY;  Surgeon: Daneil Dolin, MD;  [facility];  Service: Endoscopy;  Laterality: N/A;  10:15  . ESOPHAGOGASTRODUODENOSCOPY  11/2002   Dr. Gala Romney, erosive reflux esophagitis, multiple gastric ulcer and antral/bulbar erosions. Serologies positive for H.Pylori and was treated  . ESOPHAGOGASTRODUODENOSCOPY  11/20014   Dr. Gala Romney, small hh only, ulcers healed  . ESOPHAGOGASTRODUODENOSCOPY  09/21/2011   Dr Trevor Iha HH, antral erosions, ?early GAVE  . FISTULOGRAM Left 12/10/2016   Procedure: THROMBECTOMY OF LEFT ARM ARTERIOVENOUS FISTULA;  Surgeon: Waynetta Sandy, MD;  Location: [place];  Service: Vascular;  Laterality: Left;  . INSERTION OF DIALYSIS CATHETER Left 12/10/2016   Procedure: INSERTION OF TUNNELED DIALYSIS CATHETER;  Surgeon: Waynetta Sandy, MD;  Location: [place];  Service: Vascular;  Laterality: Left;  . IR GENERIC HISTORICAL  07/16/2016   IR REMOVAL TUN CV CATH W/O FL 07/16/2016 Saverio Danker, PA-C [facility]INTER[facility]  . IR REMOVAL TUN CV CATH W/O FL  05/12/2017  . LIGATION OF ARTERIOVENOUS  FISTULA Left 12/19/2015   Procedure: LIGATION OF RADIOCEPHALIC ARTERIOVENOUS  FISTULA;  Surgeon: Conrad [place], MD;  Location: [place];  Service: Vascular;  Laterality: Left;  Marland Kitchen MASS  EXCISION Right 02/18/2017   Procedure: EXCISION OF RIGHT AXILLARY EPIDERMAL INCLUSION CYST;  Surgeon: Conrad Addison, MD;  Location: [place];  Service: Vascular;  Laterality: Right;  . PERIPHERAL VASCULAR CATHETERIZATION N/A 12/14/2015   Procedure: Fistulagram;  Surgeon: Conrad [place], MD;  Location: [place] CV LAB;  Service: Cardiovascular;  Laterality: N/A;  . SHOULDER SURGERY    . ULTRASOUND GUIDANCE FOR VASCULAR ACCESS  04/15/2017   Procedure: Ultrasound Guidance For Vascular Access;  Surgeon: Angelia Mould, MD;  Location: [place] CV LAB;  Service: Cardiovascular;;  . UPPER EXTREMITY VENOGRAPHY Bilateral 12/17/2016   Procedure: Bilateral Upper Extremity Venography;  Surgeon: Serafina Mitchell, MD;  Location: [place] CV LAB;  Service: Cardiovascular;  Laterality: Bilateral;   Social History   Occupational History  . Occupation: retired, Licensed conveyancer    Employer: RETIRED  Tobacco Use  . Smoking status: Former Smoker    Last attempt to quit: 04/29/2004    Years since quitting: 13.1  . Smokeless tobacco: Former Systems developer    Types: Loss adjuster, chartered  Quit date: 01/16/1987  . Tobacco comment: quit 2006  Substance and Sexual Activity  . Alcohol use: No  . Drug use: No  . Sexual activity: Not on file

## 2017-06-13 DIAGNOSIS — N186 End stage renal disease: Secondary | ICD-10-CM | POA: Diagnosis not present

## 2017-06-13 DIAGNOSIS — D509 Iron deficiency anemia, unspecified: Secondary | ICD-10-CM | POA: Diagnosis not present

## 2017-06-13 DIAGNOSIS — Z992 Dependence on renal dialysis: Secondary | ICD-10-CM | POA: Diagnosis not present

## 2017-06-13 DIAGNOSIS — N2581 Secondary hyperparathyroidism of renal origin: Secondary | ICD-10-CM | POA: Diagnosis not present

## 2017-06-16 DIAGNOSIS — N186 End stage renal disease: Secondary | ICD-10-CM | POA: Diagnosis not present

## 2017-06-16 DIAGNOSIS — D509 Iron deficiency anemia, unspecified: Secondary | ICD-10-CM | POA: Diagnosis not present

## 2017-06-16 DIAGNOSIS — N2581 Secondary hyperparathyroidism of renal origin: Secondary | ICD-10-CM | POA: Diagnosis not present

## 2017-06-16 DIAGNOSIS — Z992 Dependence on renal dialysis: Secondary | ICD-10-CM | POA: Diagnosis not present

## 2017-06-18 DIAGNOSIS — D509 Iron deficiency anemia, unspecified: Secondary | ICD-10-CM | POA: Diagnosis not present

## 2017-06-18 DIAGNOSIS — Z992 Dependence on renal dialysis: Secondary | ICD-10-CM | POA: Diagnosis not present

## 2017-06-18 DIAGNOSIS — N2581 Secondary hyperparathyroidism of renal origin: Secondary | ICD-10-CM | POA: Diagnosis not present

## 2017-06-18 DIAGNOSIS — N186 End stage renal disease: Secondary | ICD-10-CM | POA: Diagnosis not present

## 2017-06-20 ENCOUNTER — Telehealth (INDEPENDENT_AMBULATORY_CARE_PROVIDER_SITE_OTHER): Payer: Self-pay | Admitting: Orthopedic Surgery

## 2017-06-20 DIAGNOSIS — N2581 Secondary hyperparathyroidism of renal origin: Secondary | ICD-10-CM | POA: Diagnosis not present

## 2017-06-20 DIAGNOSIS — D509 Iron deficiency anemia, unspecified: Secondary | ICD-10-CM | POA: Diagnosis not present

## 2017-06-20 DIAGNOSIS — N186 End stage renal disease: Secondary | ICD-10-CM | POA: Diagnosis not present

## 2017-06-20 DIAGNOSIS — Z992 Dependence on renal dialysis: Secondary | ICD-10-CM | POA: Diagnosis not present

## 2017-06-20 NOTE — Telephone Encounter (Signed)
Spoke with patient: per Dr. Jess Barters last office note, he is only to be putting weight on his right heel and for transfers only, until his recheck on 06/26/17.  He said he'll try over the weekend to find someone to drive him.

## 2017-06-20 NOTE — Telephone Encounter (Signed)
Patient called asked if he can drive? Patient advised he has several appointments (Dialysis) he need to go too. Patient said he lives by himself. The number to contact patient is [phone]

## 2017-06-21 DIAGNOSIS — I120 Hypertensive chronic kidney disease with stage 5 chronic kidney disease or end stage renal disease: Secondary | ICD-10-CM | POA: Diagnosis not present

## 2017-06-21 DIAGNOSIS — Z992 Dependence on renal dialysis: Secondary | ICD-10-CM | POA: Diagnosis not present

## 2017-06-21 DIAGNOSIS — I2510 Atherosclerotic heart disease of native coronary artery without angina pectoris: Secondary | ICD-10-CM | POA: Diagnosis not present

## 2017-06-21 DIAGNOSIS — I739 Peripheral vascular disease, unspecified: Secondary | ICD-10-CM | POA: Diagnosis not present

## 2017-06-21 DIAGNOSIS — T8789 Other complications of amputation stump: Secondary | ICD-10-CM | POA: Diagnosis not present

## 2017-06-21 DIAGNOSIS — N186 End stage renal disease: Secondary | ICD-10-CM | POA: Diagnosis not present

## 2017-06-23 ENCOUNTER — Telehealth (INDEPENDENT_AMBULATORY_CARE_PROVIDER_SITE_OTHER): Payer: Self-pay | Admitting: Orthopedic Surgery

## 2017-06-23 DIAGNOSIS — D509 Iron deficiency anemia, unspecified: Secondary | ICD-10-CM | POA: Diagnosis not present

## 2017-06-23 DIAGNOSIS — N186 End stage renal disease: Secondary | ICD-10-CM | POA: Diagnosis not present

## 2017-06-23 DIAGNOSIS — Z992 Dependence on renal dialysis: Secondary | ICD-10-CM | POA: Diagnosis not present

## 2017-06-23 DIAGNOSIS — N2581 Secondary hyperparathyroidism of renal origin: Secondary | ICD-10-CM | POA: Diagnosis not present

## 2017-06-23 NOTE — Telephone Encounter (Signed)
Benjamine Mola called stating that he has started [facility] after rehab and did a wound check and the incision is open, she just wanted to make Dr. Sharol Given aware. CB # [phone]

## 2017-06-24 DIAGNOSIS — I2510 Atherosclerotic heart disease of native coronary artery without angina pectoris: Secondary | ICD-10-CM | POA: Diagnosis not present

## 2017-06-24 DIAGNOSIS — N186 End stage renal disease: Secondary | ICD-10-CM | POA: Diagnosis not present

## 2017-06-24 DIAGNOSIS — I739 Peripheral vascular disease, unspecified: Secondary | ICD-10-CM | POA: Diagnosis not present

## 2017-06-24 DIAGNOSIS — Z992 Dependence on renal dialysis: Secondary | ICD-10-CM | POA: Diagnosis not present

## 2017-06-24 DIAGNOSIS — T8789 Other complications of amputation stump: Secondary | ICD-10-CM | POA: Diagnosis not present

## 2017-06-24 DIAGNOSIS — I120 Hypertensive chronic kidney disease with stage 5 chronic kidney disease or end stage renal disease: Secondary | ICD-10-CM | POA: Diagnosis not present

## 2017-06-24 NOTE — Telephone Encounter (Signed)
Pt is s/p a transmet amputation. Has appt on Thursday will address and give any updated orders at that time.

## 2017-06-25 DIAGNOSIS — N2581 Secondary hyperparathyroidism of renal origin: Secondary | ICD-10-CM | POA: Diagnosis not present

## 2017-06-25 DIAGNOSIS — D509 Iron deficiency anemia, unspecified: Secondary | ICD-10-CM | POA: Diagnosis not present

## 2017-06-25 DIAGNOSIS — Z992 Dependence on renal dialysis: Secondary | ICD-10-CM | POA: Diagnosis not present

## 2017-06-25 DIAGNOSIS — N186 End stage renal disease: Secondary | ICD-10-CM | POA: Diagnosis not present

## 2017-06-26 ENCOUNTER — Ambulatory Visit (INDEPENDENT_AMBULATORY_CARE_PROVIDER_SITE_OTHER): Payer: Medicare Other | Admitting: Orthopedic Surgery

## 2017-06-26 ENCOUNTER — Encounter (INDEPENDENT_AMBULATORY_CARE_PROVIDER_SITE_OTHER): Payer: Self-pay | Admitting: Orthopedic Surgery

## 2017-06-26 VITALS — Ht 65.0 in | Wt 165.0 lb

## 2017-06-26 DIAGNOSIS — Z89431 Acquired absence of right foot: Secondary | ICD-10-CM | POA: Diagnosis not present

## 2017-06-26 DIAGNOSIS — N186 End stage renal disease: Secondary | ICD-10-CM | POA: Diagnosis not present

## 2017-06-26 DIAGNOSIS — I739 Peripheral vascular disease, unspecified: Secondary | ICD-10-CM | POA: Diagnosis not present

## 2017-06-26 DIAGNOSIS — Z992 Dependence on renal dialysis: Secondary | ICD-10-CM | POA: Diagnosis not present

## 2017-06-26 DIAGNOSIS — L97411 Non-pressure chronic ulcer of right heel and midfoot limited to breakdown of skin: Secondary | ICD-10-CM

## 2017-06-26 DIAGNOSIS — T8789 Other complications of amputation stump: Secondary | ICD-10-CM | POA: Diagnosis not present

## 2017-06-26 DIAGNOSIS — I120 Hypertensive chronic kidney disease with stage 5 chronic kidney disease or end stage renal disease: Secondary | ICD-10-CM | POA: Diagnosis not present

## 2017-06-26 DIAGNOSIS — I2510 Atherosclerotic heart disease of native coronary artery without angina pectoris: Secondary | ICD-10-CM | POA: Diagnosis not present

## 2017-06-26 NOTE — Progress Notes (Signed)
Office Visit Note   Patient: Joseph SHELLHAMMER Sr.           Date of Birth: 12-27-1940           MRN: [id number] Visit Date: 06/26/2017              Requested by: [name], MD [address] [place], [place] [address] PCP: [name], MD  Chief Complaint  Patient presents with  . Right Foot - Routine Post Op    05/09/17 right transmet amputation       HPI: Patient presents in follow-up for right transmetatarsal amputation he has an ulcer over the residual limb.  Assessment & Plan: Visit Diagnoses:  1. S/P transmetatarsal amputation of foot, right ([place])   2. Midfoot skin ulcer, right, limited to breakdown of skin ([place])     Plan: Ulcer was debrided of skin and soft tissue back to healthy viable tissue he will use antibiotic ointment he is given a prescription for [employer] for extra-depth shoes custom orthotic spacer carbon plate and double upright brace.  Follow-Up Instructions: Return in about 4 weeks (around 07/24/2017).   Ortho Exam  Patient is alert, oriented, no adenopathy, well-dressed, normal affect, normal respiratory effort. Examination patient swelling has decreased the wound is healing nicely he does have a large Waggoner grade 1 ulcer.  After informed consent a 10 blade knife was used to debride the skin and soft tissue back to healthy viable tissue this was touched with silver nitrate the ulcer is 3 cm in length and 5 mm in width and 3 mm deep.  There was good healthy granulation tissue after debridement there is no exposed bone tendon.  Imaging: No results found. No images are attached to the encounter.  Labs: Lab Results  Component Value Date   REPTSTATUS 02/22/2017 FINAL 02/18/2017   GRAMSTAIN  02/18/2017    RARE WBC PRESENT, PREDOMINANTLY PMN FEW GRAM POSITIVE COCCI    CULT  02/18/2017    NO GROWTH AEROBICALLY MIXED ANAEROBIC FLORA PRESENT.  CALL LAB IF FURTHER IID REQUIRED.     @LABSALLVALUES (HGBA1)@  Body mass index is 27.46  kg/m.  Orders:  No orders of the defined types were placed in this encounter.  No orders of the defined types were placed in this encounter.    Procedures: No procedures performed  Clinical Data: No additional findings.  ROS:  All other systems negative, except as noted in the HPI. Review of Systems  Objective: Vital Signs: Ht 5\' 5"  (1.651 m)   Wt 165 lb (74.8 kg)   BMI 27.46 kg/m   Specialty Comments:  No specialty comments available.  PMFS History: Patient Active Problem List   Diagnosis Date Noted  . S/P transmetatarsal amputation of foot, right ([place]) 05/09/2017  . PVD (peripheral vascular disease) ([place]) 04/15/2017  . Nonischemic cardiomyopathy ([place]) 08/15/2015  . Aortic stenosis 08/15/2015  . Moderate aortic stenosis 07/12/2015  . 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 CAD- 40-50% LAD at cath 07/11/15 07/11/2015  . Aspirin intolerance 07/11/2015  . Abnormal stress test   . ESRD on dialysis ([place]) 05/30/2015  . CTS (carpal tunnel syndrome) 02/28/2015  . PVD of LE - Dr Oneida Alar follows 08/20/2012  . Midfoot skin ulcer, right, limited to breakdown of skin ([place]) 01/16/2012  . Encounter for screening colonoscopy 10/08/2011  . Melena 10/08/2011  . Other complications due to renal dialysis device, implant, and graft 09/24/2011   Past Medical History:  Diagnosis Date  . Anxiety   .  Aortic stenosis   . Arthritis   . CVA (cerebral infarction)   . ESRD (end stage renal disease) on dialysis [facility])    M/W/F at [facility] in [place]  . Essential hypertension   . Gangrene of right foot ([place])   . Gastric ulcer 2004  . History of cardiomyopathy    LVEF normal as of February 2017  . History of stroke    Left side weakness  . Iron deficiency anemia     Family History  Problem Relation Age of Onset  . Hypertension Mother   . Colon cancer Neg Hx   . Liver disease Neg Hx     Past Surgical History:  Procedure Laterality Date  . ABDOMINAL AORTAGRAM N/A  01/24/2012   Procedure: ABDOMINAL Maxcine Ham;  Surgeon: Elam Dutch, MD;  Location: [facility] CATH LAB;  Service: Cardiovascular;  Laterality: N/A;  . ABDOMINAL AORTOGRAM W/LOWER EXTREMITY N/A 04/15/2017   Procedure: ABDOMINAL AORTOGRAM W/LOWER EXTREMITY;  Surgeon: Angelia Mould, MD;  Location: [place] CV LAB;  Service: Cardiovascular;  Laterality: N/A;  . AMPUTATION Right 05/09/2017   Procedure: RIGHT TRANSMETATARSAL AMPUTATION;  Surgeon: [name], MD;  Location: [place];  Service: Orthopedics;  Laterality: Right;  . ARTERIOVENOUS GRAFT PLACEMENT Right right arm  . AV FISTULA PLACEMENT Left 08/31/2015   Procedure: ARTERIOVENOUS (AV) FISTULA CREATION- LEFT RADIOCEPHALIC;  Surgeon: Mal Misty, MD;  Location: [place];  Service: Vascular;  Laterality: Left;  . AV FISTULA PLACEMENT Right 02/18/2017   Procedure: INSERTION OF ARTERIOVENOUS (AV) GORE-TEX GRAFT  RIGHT UPPER ARM;  Surgeon: Conrad [place], MD;  Location: [place];  Service: Vascular;  Laterality: Right;  . BASCILIC VEIN TRANSPOSITION Left 12/19/2015   Procedure: FIRST STAGE BRACHIAL VEIN TRANSPOSITION;  Surgeon: Conrad [place], MD;  Location: [place];  Service: Vascular;  Laterality: Left;  . BASCILIC VEIN TRANSPOSITION Left 02/08/2016   Procedure: SECOND STAGE BRACHIAL VEIN TRANSPOSITION;  Surgeon: Conrad [place], MD;  Location: [place];  Service: Vascular;  Laterality: Left;  . CARDIAC CATHETERIZATION N/A 07/11/2015   Procedure: Left Heart Cath and Coronary Angiography;  Surgeon: Troy Sine, MD;  Location: [place] CV LAB;  Service: Cardiovascular;  Laterality: N/A;  . Carpel Tunnel Left Dec. 22, 2016  . CHOLECYSTECTOMY    . COLONOSCOPY  2004   Dr. Irving Shows, left sided diverticula and cecal polyp, path unknown  . COLONOSCOPY  10/29/2011   Procedure: COLONOSCOPY;  Surgeon: Daneil Dolin, MD;  [facility];  Service: Endoscopy;  Laterality: N/A;  10:15  . ESOPHAGOGASTRODUODENOSCOPY  11/2002   Dr. Gala Romney, erosive reflux  esophagitis, multiple gastric ulcer and antral/bulbar erosions. Serologies positive for H.Pylori and was treated  . ESOPHAGOGASTRODUODENOSCOPY  11/20014   Dr. Gala Romney, small hh only, ulcers healed  . ESOPHAGOGASTRODUODENOSCOPY  09/21/2011   Dr Trevor Iha HH, antral erosions, ?early GAVE  . FISTULOGRAM Left 12/10/2016   Procedure: THROMBECTOMY OF LEFT ARM ARTERIOVENOUS FISTULA;  Surgeon: Waynetta Sandy, MD;  Location: [place];  Service: Vascular;  Laterality: Left;  . INSERTION OF DIALYSIS CATHETER Left 12/10/2016   Procedure: INSERTION OF TUNNELED DIALYSIS CATHETER;  Surgeon: Waynetta Sandy, MD;  Location: [place];  Service: Vascular;  Laterality: Left;  . IR GENERIC HISTORICAL  07/16/2016   IR REMOVAL TUN CV CATH W/O FL 07/16/2016 Saverio Danker, PA-C [facility]  . IR REMOVAL TUN CV CATH W/O FL  05/12/2017  . LIGATION OF ARTERIOVENOUS  FISTULA Left 12/19/2015   Procedure:  LIGATION OF RADIOCEPHALIC ARTERIOVENOUS  FISTULA;  Surgeon: Conrad [place], MD;  Location: [place];  Service: Vascular;  Laterality: Left;  Marland Kitchen MASS EXCISION Right 02/18/2017   Procedure: EXCISION OF RIGHT AXILLARY EPIDERMAL INCLUSION CYST;  Surgeon: Conrad [place], MD;  Location: [place];  Service: Vascular;  Laterality: Right;  . PERIPHERAL VASCULAR CATHETERIZATION N/A 12/14/2015   Procedure: Fistulagram;  Surgeon: Conrad [place], MD;  Location: [place] CV LAB;  Service: Cardiovascular;  Laterality: N/A;  . SHOULDER SURGERY    . ULTRASOUND GUIDANCE FOR VASCULAR ACCESS  04/15/2017   Procedure: Ultrasound Guidance For Vascular Access;  Surgeon: Angelia Mould, MD;  Location: [place] CV LAB;  Service: Cardiovascular;;  . UPPER EXTREMITY VENOGRAPHY Bilateral 12/17/2016   Procedure: Bilateral Upper Extremity Venography;  Surgeon: Serafina Mitchell, MD;  Location: [place] CV LAB;  Service: Cardiovascular;  Laterality: Bilateral;   Social History   Occupational History  . Occupation: retired, Licensed conveyancer     Employer: RETIRED  Tobacco Use  . Smoking status: Former Smoker    Last attempt to quit: 04/29/2004    Years since quitting: 13.1  . Smokeless tobacco: Former Systems developer    Types: Chew    Quit date: 01/16/1987  . Tobacco comment: quit 2006  Substance and Sexual Activity  . Alcohol use: No  . Drug use: No  . Sexual activity: Not on file

## 2017-06-27 DIAGNOSIS — N186 End stage renal disease: Secondary | ICD-10-CM | POA: Diagnosis not present

## 2017-06-27 DIAGNOSIS — N2581 Secondary hyperparathyroidism of renal origin: Secondary | ICD-10-CM | POA: Diagnosis not present

## 2017-06-27 DIAGNOSIS — Z992 Dependence on renal dialysis: Secondary | ICD-10-CM | POA: Diagnosis not present

## 2017-06-27 DIAGNOSIS — D509 Iron deficiency anemia, unspecified: Secondary | ICD-10-CM | POA: Diagnosis not present

## 2017-06-30 DIAGNOSIS — Z992 Dependence on renal dialysis: Secondary | ICD-10-CM | POA: Diagnosis not present

## 2017-06-30 DIAGNOSIS — N2581 Secondary hyperparathyroidism of renal origin: Secondary | ICD-10-CM | POA: Diagnosis not present

## 2017-06-30 DIAGNOSIS — D509 Iron deficiency anemia, unspecified: Secondary | ICD-10-CM | POA: Diagnosis not present

## 2017-06-30 DIAGNOSIS — N186 End stage renal disease: Secondary | ICD-10-CM | POA: Diagnosis not present

## 2017-07-01 ENCOUNTER — Other Ambulatory Visit (INDEPENDENT_AMBULATORY_CARE_PROVIDER_SITE_OTHER): Payer: Self-pay

## 2017-07-01 ENCOUNTER — Telehealth (INDEPENDENT_AMBULATORY_CARE_PROVIDER_SITE_OTHER): Payer: Self-pay | Admitting: Orthopedic Surgery

## 2017-07-01 DIAGNOSIS — I739 Peripheral vascular disease, unspecified: Secondary | ICD-10-CM | POA: Diagnosis not present

## 2017-07-01 DIAGNOSIS — I2510 Atherosclerotic heart disease of native coronary artery without angina pectoris: Secondary | ICD-10-CM | POA: Diagnosis not present

## 2017-07-01 DIAGNOSIS — I120 Hypertensive chronic kidney disease with stage 5 chronic kidney disease or end stage renal disease: Secondary | ICD-10-CM | POA: Diagnosis not present

## 2017-07-01 DIAGNOSIS — N186 End stage renal disease: Secondary | ICD-10-CM | POA: Diagnosis not present

## 2017-07-01 DIAGNOSIS — Z992 Dependence on renal dialysis: Secondary | ICD-10-CM | POA: Diagnosis not present

## 2017-07-01 DIAGNOSIS — T8789 Other complications of amputation stump: Secondary | ICD-10-CM | POA: Diagnosis not present

## 2017-07-01 MED ORDER — MUPIROCIN 2 % EX OINT: 1.0000 "application " | TOPICAL_OINTMENT | Freq: Every day | CUTANEOUS | 6 refills | Status: DC

## 2017-07-01 NOTE — Telephone Encounter (Signed)
Could the Bactroban be resent in to the pharmacy, it wasn't received.

## 2017-07-01 NOTE — Telephone Encounter (Signed)
Called in ointment to pharm this will not fax. Lm on vm to reorder.

## 2017-07-02 DIAGNOSIS — D509 Iron deficiency anemia, unspecified: Secondary | ICD-10-CM | POA: Diagnosis not present

## 2017-07-02 DIAGNOSIS — N2581 Secondary hyperparathyroidism of renal origin: Secondary | ICD-10-CM | POA: Diagnosis not present

## 2017-07-02 DIAGNOSIS — Z992 Dependence on renal dialysis: Secondary | ICD-10-CM | POA: Diagnosis not present

## 2017-07-02 DIAGNOSIS — N186 End stage renal disease: Secondary | ICD-10-CM | POA: Diagnosis not present

## 2017-07-03 DIAGNOSIS — K219 Gastro-esophageal reflux disease without esophagitis: Secondary | ICD-10-CM | POA: Diagnosis not present

## 2017-07-03 DIAGNOSIS — T8789 Other complications of amputation stump: Secondary | ICD-10-CM | POA: Diagnosis not present

## 2017-07-03 DIAGNOSIS — I739 Peripheral vascular disease, unspecified: Secondary | ICD-10-CM | POA: Diagnosis not present

## 2017-07-03 DIAGNOSIS — I2510 Atherosclerotic heart disease of native coronary artery without angina pectoris: Secondary | ICD-10-CM | POA: Diagnosis not present

## 2017-07-03 DIAGNOSIS — I5022 Chronic systolic (congestive) heart failure: Secondary | ICD-10-CM | POA: Diagnosis not present

## 2017-07-03 DIAGNOSIS — S98921S Partial traumatic amputation of right foot, level unspecified, sequela: Secondary | ICD-10-CM | POA: Diagnosis not present

## 2017-07-03 DIAGNOSIS — I120 Hypertensive chronic kidney disease with stage 5 chronic kidney disease or end stage renal disease: Secondary | ICD-10-CM | POA: Diagnosis not present

## 2017-07-03 DIAGNOSIS — N186 End stage renal disease: Secondary | ICD-10-CM | POA: Diagnosis not present

## 2017-07-03 DIAGNOSIS — Z992 Dependence on renal dialysis: Secondary | ICD-10-CM | POA: Diagnosis not present

## 2017-07-04 DIAGNOSIS — Z992 Dependence on renal dialysis: Secondary | ICD-10-CM | POA: Diagnosis not present

## 2017-07-04 DIAGNOSIS — N186 End stage renal disease: Secondary | ICD-10-CM | POA: Diagnosis not present

## 2017-07-04 DIAGNOSIS — N2581 Secondary hyperparathyroidism of renal origin: Secondary | ICD-10-CM | POA: Diagnosis not present

## 2017-07-04 DIAGNOSIS — D509 Iron deficiency anemia, unspecified: Secondary | ICD-10-CM | POA: Diagnosis not present

## 2017-07-07 DIAGNOSIS — Z992 Dependence on renal dialysis: Secondary | ICD-10-CM | POA: Diagnosis not present

## 2017-07-07 DIAGNOSIS — D509 Iron deficiency anemia, unspecified: Secondary | ICD-10-CM | POA: Diagnosis not present

## 2017-07-07 DIAGNOSIS — N2581 Secondary hyperparathyroidism of renal origin: Secondary | ICD-10-CM | POA: Diagnosis not present

## 2017-07-07 DIAGNOSIS — N186 End stage renal disease: Secondary | ICD-10-CM | POA: Diagnosis not present

## 2017-07-08 DIAGNOSIS — T8789 Other complications of amputation stump: Secondary | ICD-10-CM | POA: Diagnosis not present

## 2017-07-08 DIAGNOSIS — I2510 Atherosclerotic heart disease of native coronary artery without angina pectoris: Secondary | ICD-10-CM | POA: Diagnosis not present

## 2017-07-08 DIAGNOSIS — N186 End stage renal disease: Secondary | ICD-10-CM | POA: Diagnosis not present

## 2017-07-08 DIAGNOSIS — I739 Peripheral vascular disease, unspecified: Secondary | ICD-10-CM | POA: Diagnosis not present

## 2017-07-08 DIAGNOSIS — I120 Hypertensive chronic kidney disease with stage 5 chronic kidney disease or end stage renal disease: Secondary | ICD-10-CM | POA: Diagnosis not present

## 2017-07-08 DIAGNOSIS — Z992 Dependence on renal dialysis: Secondary | ICD-10-CM | POA: Diagnosis not present

## 2017-07-09 DIAGNOSIS — N186 End stage renal disease: Secondary | ICD-10-CM | POA: Diagnosis not present

## 2017-07-09 DIAGNOSIS — Z992 Dependence on renal dialysis: Secondary | ICD-10-CM | POA: Diagnosis not present

## 2017-07-09 DIAGNOSIS — D509 Iron deficiency anemia, unspecified: Secondary | ICD-10-CM | POA: Diagnosis not present

## 2017-07-09 DIAGNOSIS — N2581 Secondary hyperparathyroidism of renal origin: Secondary | ICD-10-CM | POA: Diagnosis not present

## 2017-07-10 DIAGNOSIS — N186 End stage renal disease: Secondary | ICD-10-CM | POA: Diagnosis not present

## 2017-07-10 DIAGNOSIS — I120 Hypertensive chronic kidney disease with stage 5 chronic kidney disease or end stage renal disease: Secondary | ICD-10-CM | POA: Diagnosis not present

## 2017-07-10 DIAGNOSIS — I739 Peripheral vascular disease, unspecified: Secondary | ICD-10-CM | POA: Diagnosis not present

## 2017-07-10 DIAGNOSIS — I2510 Atherosclerotic heart disease of native coronary artery without angina pectoris: Secondary | ICD-10-CM | POA: Diagnosis not present

## 2017-07-10 DIAGNOSIS — Z992 Dependence on renal dialysis: Secondary | ICD-10-CM | POA: Diagnosis not present

## 2017-07-10 DIAGNOSIS — T8789 Other complications of amputation stump: Secondary | ICD-10-CM | POA: Diagnosis not present

## 2017-07-11 DIAGNOSIS — Z992 Dependence on renal dialysis: Secondary | ICD-10-CM | POA: Diagnosis not present

## 2017-07-11 DIAGNOSIS — N2581 Secondary hyperparathyroidism of renal origin: Secondary | ICD-10-CM | POA: Diagnosis not present

## 2017-07-11 DIAGNOSIS — D509 Iron deficiency anemia, unspecified: Secondary | ICD-10-CM | POA: Diagnosis not present

## 2017-07-11 DIAGNOSIS — N186 End stage renal disease: Secondary | ICD-10-CM | POA: Diagnosis not present

## 2017-07-14 DIAGNOSIS — Z992 Dependence on renal dialysis: Secondary | ICD-10-CM | POA: Diagnosis not present

## 2017-07-14 DIAGNOSIS — D509 Iron deficiency anemia, unspecified: Secondary | ICD-10-CM | POA: Diagnosis not present

## 2017-07-14 DIAGNOSIS — N2581 Secondary hyperparathyroidism of renal origin: Secondary | ICD-10-CM | POA: Diagnosis not present

## 2017-07-14 DIAGNOSIS — N186 End stage renal disease: Secondary | ICD-10-CM | POA: Diagnosis not present

## 2017-07-15 DIAGNOSIS — I120 Hypertensive chronic kidney disease with stage 5 chronic kidney disease or end stage renal disease: Secondary | ICD-10-CM | POA: Diagnosis not present

## 2017-07-15 DIAGNOSIS — Z992 Dependence on renal dialysis: Secondary | ICD-10-CM | POA: Diagnosis not present

## 2017-07-15 DIAGNOSIS — I2510 Atherosclerotic heart disease of native coronary artery without angina pectoris: Secondary | ICD-10-CM | POA: Diagnosis not present

## 2017-07-15 DIAGNOSIS — N186 End stage renal disease: Secondary | ICD-10-CM | POA: Diagnosis not present

## 2017-07-15 DIAGNOSIS — T8789 Other complications of amputation stump: Secondary | ICD-10-CM | POA: Diagnosis not present

## 2017-07-15 DIAGNOSIS — I739 Peripheral vascular disease, unspecified: Secondary | ICD-10-CM | POA: Diagnosis not present

## 2017-07-16 DIAGNOSIS — Z992 Dependence on renal dialysis: Secondary | ICD-10-CM | POA: Diagnosis not present

## 2017-07-16 DIAGNOSIS — N186 End stage renal disease: Secondary | ICD-10-CM | POA: Diagnosis not present

## 2017-07-16 DIAGNOSIS — N2581 Secondary hyperparathyroidism of renal origin: Secondary | ICD-10-CM | POA: Diagnosis not present

## 2017-07-16 DIAGNOSIS — D509 Iron deficiency anemia, unspecified: Secondary | ICD-10-CM | POA: Diagnosis not present

## 2017-07-17 DIAGNOSIS — Z992 Dependence on renal dialysis: Secondary | ICD-10-CM | POA: Diagnosis not present

## 2017-07-17 DIAGNOSIS — T8789 Other complications of amputation stump: Secondary | ICD-10-CM | POA: Diagnosis not present

## 2017-07-17 DIAGNOSIS — I739 Peripheral vascular disease, unspecified: Secondary | ICD-10-CM | POA: Diagnosis not present

## 2017-07-17 DIAGNOSIS — N186 End stage renal disease: Secondary | ICD-10-CM | POA: Diagnosis not present

## 2017-07-17 DIAGNOSIS — I2510 Atherosclerotic heart disease of native coronary artery without angina pectoris: Secondary | ICD-10-CM | POA: Diagnosis not present

## 2017-07-17 DIAGNOSIS — I120 Hypertensive chronic kidney disease with stage 5 chronic kidney disease or end stage renal disease: Secondary | ICD-10-CM | POA: Diagnosis not present

## 2017-07-18 DIAGNOSIS — N186 End stage renal disease: Secondary | ICD-10-CM | POA: Diagnosis not present

## 2017-07-18 DIAGNOSIS — D509 Iron deficiency anemia, unspecified: Secondary | ICD-10-CM | POA: Diagnosis not present

## 2017-07-18 DIAGNOSIS — N2581 Secondary hyperparathyroidism of renal origin: Secondary | ICD-10-CM | POA: Diagnosis not present

## 2017-07-18 DIAGNOSIS — Z992 Dependence on renal dialysis: Secondary | ICD-10-CM | POA: Diagnosis not present

## 2017-07-21 DIAGNOSIS — D509 Iron deficiency anemia, unspecified: Secondary | ICD-10-CM | POA: Diagnosis not present

## 2017-07-21 DIAGNOSIS — Z992 Dependence on renal dialysis: Secondary | ICD-10-CM | POA: Diagnosis not present

## 2017-07-21 DIAGNOSIS — N2581 Secondary hyperparathyroidism of renal origin: Secondary | ICD-10-CM | POA: Diagnosis not present

## 2017-07-21 DIAGNOSIS — N186 End stage renal disease: Secondary | ICD-10-CM | POA: Diagnosis not present

## 2017-07-22 DIAGNOSIS — I739 Peripheral vascular disease, unspecified: Secondary | ICD-10-CM | POA: Diagnosis not present

## 2017-07-22 DIAGNOSIS — I120 Hypertensive chronic kidney disease with stage 5 chronic kidney disease or end stage renal disease: Secondary | ICD-10-CM | POA: Diagnosis not present

## 2017-07-22 DIAGNOSIS — N186 End stage renal disease: Secondary | ICD-10-CM | POA: Diagnosis not present

## 2017-07-22 DIAGNOSIS — Z992 Dependence on renal dialysis: Secondary | ICD-10-CM | POA: Diagnosis not present

## 2017-07-22 DIAGNOSIS — I2510 Atherosclerotic heart disease of native coronary artery without angina pectoris: Secondary | ICD-10-CM | POA: Diagnosis not present

## 2017-07-22 DIAGNOSIS — T8789 Other complications of amputation stump: Secondary | ICD-10-CM | POA: Diagnosis not present

## 2017-07-23 DIAGNOSIS — N2581 Secondary hyperparathyroidism of renal origin: Secondary | ICD-10-CM | POA: Diagnosis not present

## 2017-07-23 DIAGNOSIS — N186 End stage renal disease: Secondary | ICD-10-CM | POA: Diagnosis not present

## 2017-07-23 DIAGNOSIS — D509 Iron deficiency anemia, unspecified: Secondary | ICD-10-CM | POA: Diagnosis not present

## 2017-07-23 DIAGNOSIS — Z992 Dependence on renal dialysis: Secondary | ICD-10-CM | POA: Diagnosis not present

## 2017-07-24 ENCOUNTER — Encounter (INDEPENDENT_AMBULATORY_CARE_PROVIDER_SITE_OTHER): Payer: Self-pay | Admitting: Orthopedic Surgery

## 2017-07-24 ENCOUNTER — Ambulatory Visit (INDEPENDENT_AMBULATORY_CARE_PROVIDER_SITE_OTHER): Payer: Medicare Other | Admitting: Orthopedic Surgery

## 2017-07-24 VITALS — Ht 65.0 in | Wt 165.0 lb

## 2017-07-24 DIAGNOSIS — Z89431 Acquired absence of right foot: Secondary | ICD-10-CM | POA: Diagnosis not present

## 2017-07-24 DIAGNOSIS — L97411 Non-pressure chronic ulcer of right heel and midfoot limited to breakdown of skin: Secondary | ICD-10-CM

## 2017-07-24 NOTE — Progress Notes (Signed)
Office Visit Note   Patient: Joseph ARAVE Sr.           Date of Birth: 1941-04-21           MRN: [id number] Visit Date: 07/24/2017              Requested by: [name], MD [address] [place], [place] [address] PCP: [name], MD  Chief Complaint  Patient presents with  . 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    Assessment & Plan: Visit Diagnoses:  1. S/P transmetatarsal amputation of foot, right ([place])   2. Midfoot skin ulcer, right, limited to breakdown of skin ([place])     Plan: Ulcer debrided of skin and soft tissue.  Patient was given prescriptions for [facility] for extra-depth shoes custom orthotics a spacer for the right shoe as well as a brace for the right lower extremity.  Discussed the patient could resume driving once the shoes and braces are fabricated.  Follow-Up Instructions: Return in about 1 month (around 08/21/2017).   Ortho Exam  Patient is alert, oriented, no adenopathy, well-dressed, normal affect, normal respiratory effort. Examination patient has no redness no cellulitis no drainage he does have callus and an open wound.  After informed consent the skin and soft tissue was debrided with a 10 blade knife back to healthy viable bleeding granulation tissue this was touched with silver nitrate the ulcerative area is 10 x 30 mm and 3 mm deep.  Imaging: No results found. No images are attached to the encounter.  Labs: Lab Results  Component Value Date   REPTSTATUS 02/22/2017 FINAL 02/18/2017   GRAMSTAIN  02/18/2017    RARE WBC PRESENT, PREDOMINANTLY PMN FEW GRAM POSITIVE COCCI    CULT  02/18/2017    NO GROWTH AEROBICALLY MIXED ANAEROBIC FLORA PRESENT.  CALL LAB IF FURTHER IID REQUIRED.     @LABSALLVALUES (HGBA1)@  Body mass index is 27.46  kg/m.  Orders:  No orders of the defined types were placed in this encounter.  No orders of the defined types were placed in this encounter.    Procedures: No procedures performed  Clinical Data: No additional findings.  ROS:  All other systems negative, except as noted in the HPI. Review of Systems  Objective: Vital Signs: Ht 5\' 5"  (1.651 m)   Wt 165 lb (74.8 kg)   BMI 27.46 kg/m   Specialty Comments:  No specialty comments available.  PMFS History: Patient Active Problem List   Diagnosis Date Noted  . S/P transmetatarsal amputation of foot, right ([place]) 05/09/2017  . PVD (peripheral vascular disease) ([place]) 04/15/2017  . Nonischemic cardiomyopathy ([place]) 08/15/2015  . Aortic stenosis 08/15/2015  . Moderate aortic stenosis 07/12/2015  . 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 CAD- 40-50% LAD at cath 07/11/15 07/11/2015  . Aspirin intolerance 07/11/2015  . Abnormal stress test   . ESRD on dialysis ([place]) 05/30/2015  . CTS (carpal tunnel syndrome) 02/28/2015  . PVD of LE - Dr Oneida Alar follows 08/20/2012  . Midfoot skin ulcer, right, limited to breakdown of skin ([place]) 01/16/2012  . Encounter for screening colonoscopy 10/08/2011  . Melena 10/08/2011  . Other complications due to renal dialysis device, implant, and graft 09/24/2011   Past Medical History:  Diagnosis Date  . Anxiety   .  Aortic stenosis   . Arthritis   . CVA (cerebral infarction)   . ESRD (end stage renal disease) on dialysis [facility])    M/W/F at [facility] in [place]  . Essential hypertension   . Gangrene of right foot ([place])   . Gastric ulcer 2004  . History of cardiomyopathy    LVEF normal as of February 2017  . History of stroke    Left side weakness  . Iron deficiency anemia     Family History  Problem Relation Age of Onset  . Hypertension Mother   . Colon cancer Neg Hx   . Liver disease Neg Hx     Past Surgical History:  Procedure Laterality Date  . ABDOMINAL AORTAGRAM N/A  01/24/2012   Procedure: ABDOMINAL Maxcine Ham;  Surgeon: Elam Dutch, MD;  Location: [facility] CATH LAB;  Service: Cardiovascular;  Laterality: N/A;  . ABDOMINAL AORTOGRAM W/LOWER EXTREMITY N/A 04/15/2017   Procedure: ABDOMINAL AORTOGRAM W/LOWER EXTREMITY;  Surgeon: Angelia Mould, MD;  Location: [place] CV LAB;  Service: Cardiovascular;  Laterality: N/A;  . AMPUTATION Right 05/09/2017   Procedure: RIGHT TRANSMETATARSAL AMPUTATION;  Surgeon: [name], MD;  Location: [place];  Service: Orthopedics;  Laterality: Right;  . ARTERIOVENOUS GRAFT PLACEMENT Right right arm  . AV FISTULA PLACEMENT Left 08/31/2015   Procedure: ARTERIOVENOUS (AV) FISTULA CREATION- LEFT RADIOCEPHALIC;  Surgeon: Mal Misty, MD;  Location: [place];  Service: Vascular;  Laterality: Left;  . AV FISTULA PLACEMENT Right 02/18/2017   Procedure: INSERTION OF ARTERIOVENOUS (AV) GORE-TEX GRAFT  RIGHT UPPER ARM;  Surgeon: Conrad [place], MD;  Location: [place];  Service: Vascular;  Laterality: Right;  . BASCILIC VEIN TRANSPOSITION Left 12/19/2015   Procedure: FIRST STAGE BRACHIAL VEIN TRANSPOSITION;  Surgeon: Conrad [place], MD;  Location: [place];  Service: Vascular;  Laterality: Left;  . BASCILIC VEIN TRANSPOSITION Left 02/08/2016   Procedure: SECOND STAGE BRACHIAL VEIN TRANSPOSITION;  Surgeon: Conrad [place], MD;  Location: [place];  Service: Vascular;  Laterality: Left;  . CARDIAC CATHETERIZATION N/A 07/11/2015   Procedure: Left Heart Cath and Coronary Angiography;  Surgeon: Troy Sine, MD;  Location: [place] CV LAB;  Service: Cardiovascular;  Laterality: N/A;  . Carpel Tunnel Left Dec. 22, 2016  . CHOLECYSTECTOMY    . COLONOSCOPY  2004   Dr. Irving Shows, left sided diverticula and cecal polyp, path unknown  . COLONOSCOPY  10/29/2011   Procedure: COLONOSCOPY;  Surgeon: Daneil Dolin, MD;  [facility];  Service: Endoscopy;  Laterality: N/A;  10:15  . ESOPHAGOGASTRODUODENOSCOPY  11/2002   Dr. Gala Romney, erosive reflux  esophagitis, multiple gastric ulcer and antral/bulbar erosions. Serologies positive for H.Pylori and was treated  . ESOPHAGOGASTRODUODENOSCOPY  11/20014   Dr. Gala Romney, small hh only, ulcers healed  . ESOPHAGOGASTRODUODENOSCOPY  09/21/2011   Dr Trevor Iha [facility], antral erosions, ?early GAVE  . FISTULOGRAM Left 12/10/2016   Procedure: THROMBECTOMY OF LEFT ARM ARTERIOVENOUS FISTULA;  Surgeon: Waynetta Sandy, MD;  Location: [place];  Service: Vascular;  Laterality: Left;  . INSERTION OF DIALYSIS CATHETER Left 12/10/2016   Procedure: INSERTION OF TUNNELED DIALYSIS CATHETER;  Surgeon: Waynetta Sandy, MD;  Location: [place];  Service: Vascular;  Laterality: Left;  . IR GENERIC HISTORICAL  07/16/2016   IR REMOVAL TUN CV CATH W/O FL 07/16/2016 Saverio Danker, PA-C [facility]-INTERV RAD  . IR REMOVAL TUN CV CATH W/O FL  05/12/2017  . LIGATION OF ARTERIOVENOUS  FISTULA Left 12/19/2015   Procedure:  LIGATION OF RADIOCEPHALIC ARTERIOVENOUS  FISTULA;  Surgeon: Conrad Carlton, MD;  Location: [place];  Service: Vascular;  Laterality: Left;  Marland Kitchen MASS EXCISION Right 02/18/2017   Procedure: EXCISION OF RIGHT AXILLARY EPIDERMAL INCLUSION CYST;  Surgeon: Conrad [place], MD;  Location: [place];  Service: Vascular;  Laterality: Right;  . PERIPHERAL VASCULAR CATHETERIZATION N/A 12/14/2015   Procedure: Fistulagram;  Surgeon: Conrad [place], MD;  Location: [place] CV LAB;  Service: Cardiovascular;  Laterality: N/A;  . SHOULDER SURGERY    . ULTRASOUND GUIDANCE FOR VASCULAR ACCESS  04/15/2017   Procedure: Ultrasound Guidance For Vascular Access;  Surgeon: Angelia Mould, MD;  Location: [place] CV LAB;  Service: Cardiovascular;;  . UPPER EXTREMITY VENOGRAPHY Bilateral 12/17/2016   Procedure: Bilateral Upper Extremity Venography;  Surgeon: Serafina Mitchell, MD;  Location: [place] CV LAB;  Service: Cardiovascular;  Laterality: Bilateral;   Social History   Occupational History  . Occupation: retired, Licensed conveyancer     Employer: RETIRED  Tobacco Use  . Smoking status: Former Smoker    Last attempt to quit: 04/29/2004    Years since quitting: 13.2  . Smokeless tobacco: Former Systems developer    Types: Chew    Quit date: 01/16/1987  . Tobacco comment: quit 2006  Substance and Sexual Activity  . Alcohol use: No  . Drug use: No  . Sexual activity: Not on file

## 2017-07-25 DIAGNOSIS — Z992 Dependence on renal dialysis: Secondary | ICD-10-CM | POA: Diagnosis not present

## 2017-07-25 DIAGNOSIS — N186 End stage renal disease: Secondary | ICD-10-CM | POA: Diagnosis not present

## 2017-07-25 DIAGNOSIS — D509 Iron deficiency anemia, unspecified: Secondary | ICD-10-CM | POA: Diagnosis not present

## 2017-07-25 DIAGNOSIS — N2581 Secondary hyperparathyroidism of renal origin: Secondary | ICD-10-CM | POA: Diagnosis not present

## 2017-07-27 DIAGNOSIS — Z992 Dependence on renal dialysis: Secondary | ICD-10-CM | POA: Diagnosis not present

## 2017-07-27 DIAGNOSIS — N186 End stage renal disease: Secondary | ICD-10-CM | POA: Diagnosis not present

## 2017-07-28 DIAGNOSIS — N2581 Secondary hyperparathyroidism of renal origin: Secondary | ICD-10-CM | POA: Diagnosis not present

## 2017-07-28 DIAGNOSIS — N186 End stage renal disease: Secondary | ICD-10-CM | POA: Diagnosis not present

## 2017-07-28 DIAGNOSIS — Z992 Dependence on renal dialysis: Secondary | ICD-10-CM | POA: Diagnosis not present

## 2017-07-28 DIAGNOSIS — D509 Iron deficiency anemia, unspecified: Secondary | ICD-10-CM | POA: Diagnosis not present

## 2017-07-29 DIAGNOSIS — I2510 Atherosclerotic heart disease of native coronary artery without angina pectoris: Secondary | ICD-10-CM | POA: Diagnosis not present

## 2017-07-29 DIAGNOSIS — N186 End stage renal disease: Secondary | ICD-10-CM | POA: Diagnosis not present

## 2017-07-29 DIAGNOSIS — Z992 Dependence on renal dialysis: Secondary | ICD-10-CM | POA: Diagnosis not present

## 2017-07-29 DIAGNOSIS — I120 Hypertensive chronic kidney disease with stage 5 chronic kidney disease or end stage renal disease: Secondary | ICD-10-CM | POA: Diagnosis not present

## 2017-07-29 DIAGNOSIS — T8789 Other complications of amputation stump: Secondary | ICD-10-CM | POA: Diagnosis not present

## 2017-07-29 DIAGNOSIS — I739 Peripheral vascular disease, unspecified: Secondary | ICD-10-CM | POA: Diagnosis not present

## 2017-07-30 DIAGNOSIS — Z992 Dependence on renal dialysis: Secondary | ICD-10-CM | POA: Diagnosis not present

## 2017-07-30 DIAGNOSIS — N2581 Secondary hyperparathyroidism of renal origin: Secondary | ICD-10-CM | POA: Diagnosis not present

## 2017-07-30 DIAGNOSIS — N186 End stage renal disease: Secondary | ICD-10-CM | POA: Diagnosis not present

## 2017-07-30 DIAGNOSIS — D509 Iron deficiency anemia, unspecified: Secondary | ICD-10-CM | POA: Diagnosis not present

## 2017-07-31 DIAGNOSIS — I120 Hypertensive chronic kidney disease with stage 5 chronic kidney disease or end stage renal disease: Secondary | ICD-10-CM | POA: Diagnosis not present

## 2017-07-31 DIAGNOSIS — I739 Peripheral vascular disease, unspecified: Secondary | ICD-10-CM | POA: Diagnosis not present

## 2017-07-31 DIAGNOSIS — N186 End stage renal disease: Secondary | ICD-10-CM | POA: Diagnosis not present

## 2017-07-31 DIAGNOSIS — T8789 Other complications of amputation stump: Secondary | ICD-10-CM | POA: Diagnosis not present

## 2017-07-31 DIAGNOSIS — I2510 Atherosclerotic heart disease of native coronary artery without angina pectoris: Secondary | ICD-10-CM | POA: Diagnosis not present

## 2017-07-31 DIAGNOSIS — Z992 Dependence on renal dialysis: Secondary | ICD-10-CM | POA: Diagnosis not present

## 2017-08-01 DIAGNOSIS — Z992 Dependence on renal dialysis: Secondary | ICD-10-CM | POA: Diagnosis not present

## 2017-08-01 DIAGNOSIS — D509 Iron deficiency anemia, unspecified: Secondary | ICD-10-CM | POA: Diagnosis not present

## 2017-08-01 DIAGNOSIS — N186 End stage renal disease: Secondary | ICD-10-CM | POA: Diagnosis not present

## 2017-08-01 DIAGNOSIS — N2581 Secondary hyperparathyroidism of renal origin: Secondary | ICD-10-CM | POA: Diagnosis not present

## 2017-08-01 DIAGNOSIS — L97521 Non-pressure chronic ulcer of other part of left foot limited to breakdown of skin: Secondary | ICD-10-CM | POA: Diagnosis not present

## 2017-08-01 DIAGNOSIS — E1142 Type 2 diabetes mellitus with diabetic polyneuropathy: Secondary | ICD-10-CM | POA: Diagnosis not present

## 2017-08-04 DIAGNOSIS — Z992 Dependence on renal dialysis: Secondary | ICD-10-CM | POA: Diagnosis not present

## 2017-08-04 DIAGNOSIS — D509 Iron deficiency anemia, unspecified: Secondary | ICD-10-CM | POA: Diagnosis not present

## 2017-08-04 DIAGNOSIS — N186 End stage renal disease: Secondary | ICD-10-CM | POA: Diagnosis not present

## 2017-08-04 DIAGNOSIS — N2581 Secondary hyperparathyroidism of renal origin: Secondary | ICD-10-CM | POA: Diagnosis not present

## 2017-08-05 DIAGNOSIS — I739 Peripheral vascular disease, unspecified: Secondary | ICD-10-CM | POA: Diagnosis not present

## 2017-08-05 DIAGNOSIS — Z992 Dependence on renal dialysis: Secondary | ICD-10-CM | POA: Diagnosis not present

## 2017-08-05 DIAGNOSIS — T8789 Other complications of amputation stump: Secondary | ICD-10-CM | POA: Diagnosis not present

## 2017-08-05 DIAGNOSIS — I120 Hypertensive chronic kidney disease with stage 5 chronic kidney disease or end stage renal disease: Secondary | ICD-10-CM | POA: Diagnosis not present

## 2017-08-05 DIAGNOSIS — N186 End stage renal disease: Secondary | ICD-10-CM | POA: Diagnosis not present

## 2017-08-05 DIAGNOSIS — I2510 Atherosclerotic heart disease of native coronary artery without angina pectoris: Secondary | ICD-10-CM | POA: Diagnosis not present

## 2017-08-06 DIAGNOSIS — D509 Iron deficiency anemia, unspecified: Secondary | ICD-10-CM | POA: Diagnosis not present

## 2017-08-06 DIAGNOSIS — N2581 Secondary hyperparathyroidism of renal origin: Secondary | ICD-10-CM | POA: Diagnosis not present

## 2017-08-06 DIAGNOSIS — N186 End stage renal disease: Secondary | ICD-10-CM | POA: Diagnosis not present

## 2017-08-06 DIAGNOSIS — Z992 Dependence on renal dialysis: Secondary | ICD-10-CM | POA: Diagnosis not present

## 2017-08-08 DIAGNOSIS — Z992 Dependence on renal dialysis: Secondary | ICD-10-CM | POA: Diagnosis not present

## 2017-08-08 DIAGNOSIS — N186 End stage renal disease: Secondary | ICD-10-CM | POA: Diagnosis not present

## 2017-08-08 DIAGNOSIS — D509 Iron deficiency anemia, unspecified: Secondary | ICD-10-CM | POA: Diagnosis not present

## 2017-08-08 DIAGNOSIS — N2581 Secondary hyperparathyroidism of renal origin: Secondary | ICD-10-CM | POA: Diagnosis not present

## 2017-08-11 DIAGNOSIS — N2581 Secondary hyperparathyroidism of renal origin: Secondary | ICD-10-CM | POA: Diagnosis not present

## 2017-08-11 DIAGNOSIS — Z992 Dependence on renal dialysis: Secondary | ICD-10-CM | POA: Diagnosis not present

## 2017-08-11 DIAGNOSIS — N186 End stage renal disease: Secondary | ICD-10-CM | POA: Diagnosis not present

## 2017-08-11 DIAGNOSIS — D509 Iron deficiency anemia, unspecified: Secondary | ICD-10-CM | POA: Diagnosis not present

## 2017-08-12 DIAGNOSIS — I739 Peripheral vascular disease, unspecified: Secondary | ICD-10-CM | POA: Diagnosis not present

## 2017-08-12 DIAGNOSIS — Z992 Dependence on renal dialysis: Secondary | ICD-10-CM | POA: Diagnosis not present

## 2017-08-12 DIAGNOSIS — N186 End stage renal disease: Secondary | ICD-10-CM | POA: Diagnosis not present

## 2017-08-12 DIAGNOSIS — T8789 Other complications of amputation stump: Secondary | ICD-10-CM | POA: Diagnosis not present

## 2017-08-12 DIAGNOSIS — I2510 Atherosclerotic heart disease of native coronary artery without angina pectoris: Secondary | ICD-10-CM | POA: Diagnosis not present

## 2017-08-12 DIAGNOSIS — I120 Hypertensive chronic kidney disease with stage 5 chronic kidney disease or end stage renal disease: Secondary | ICD-10-CM | POA: Diagnosis not present

## 2017-08-13 DIAGNOSIS — Z992 Dependence on renal dialysis: Secondary | ICD-10-CM | POA: Diagnosis not present

## 2017-08-13 DIAGNOSIS — D509 Iron deficiency anemia, unspecified: Secondary | ICD-10-CM | POA: Diagnosis not present

## 2017-08-13 DIAGNOSIS — N186 End stage renal disease: Secondary | ICD-10-CM | POA: Diagnosis not present

## 2017-08-13 DIAGNOSIS — N2581 Secondary hyperparathyroidism of renal origin: Secondary | ICD-10-CM | POA: Diagnosis not present

## 2017-08-15 DIAGNOSIS — N186 End stage renal disease: Secondary | ICD-10-CM | POA: Diagnosis not present

## 2017-08-15 DIAGNOSIS — D509 Iron deficiency anemia, unspecified: Secondary | ICD-10-CM | POA: Diagnosis not present

## 2017-08-15 DIAGNOSIS — Z992 Dependence on renal dialysis: Secondary | ICD-10-CM | POA: Diagnosis not present

## 2017-08-15 DIAGNOSIS — N2581 Secondary hyperparathyroidism of renal origin: Secondary | ICD-10-CM | POA: Diagnosis not present

## 2017-08-18 DIAGNOSIS — N2581 Secondary hyperparathyroidism of renal origin: Secondary | ICD-10-CM | POA: Diagnosis not present

## 2017-08-18 DIAGNOSIS — Z992 Dependence on renal dialysis: Secondary | ICD-10-CM | POA: Diagnosis not present

## 2017-08-18 DIAGNOSIS — I2510 Atherosclerotic heart disease of native coronary artery without angina pectoris: Secondary | ICD-10-CM | POA: Diagnosis not present

## 2017-08-18 DIAGNOSIS — N186 End stage renal disease: Secondary | ICD-10-CM | POA: Diagnosis not present

## 2017-08-18 DIAGNOSIS — T8789 Other complications of amputation stump: Secondary | ICD-10-CM | POA: Diagnosis not present

## 2017-08-18 DIAGNOSIS — D509 Iron deficiency anemia, unspecified: Secondary | ICD-10-CM | POA: Diagnosis not present

## 2017-08-18 DIAGNOSIS — I739 Peripheral vascular disease, unspecified: Secondary | ICD-10-CM | POA: Diagnosis not present

## 2017-08-18 DIAGNOSIS — I120 Hypertensive chronic kidney disease with stage 5 chronic kidney disease or end stage renal disease: Secondary | ICD-10-CM | POA: Diagnosis not present

## 2017-08-20 DIAGNOSIS — N186 End stage renal disease: Secondary | ICD-10-CM | POA: Diagnosis not present

## 2017-08-20 DIAGNOSIS — D509 Iron deficiency anemia, unspecified: Secondary | ICD-10-CM | POA: Diagnosis not present

## 2017-08-20 DIAGNOSIS — Z992 Dependence on renal dialysis: Secondary | ICD-10-CM | POA: Diagnosis not present

## 2017-08-20 DIAGNOSIS — N2581 Secondary hyperparathyroidism of renal origin: Secondary | ICD-10-CM | POA: Diagnosis not present

## 2017-08-22 DIAGNOSIS — L97521 Non-pressure chronic ulcer of other part of left foot limited to breakdown of skin: Secondary | ICD-10-CM | POA: Diagnosis not present

## 2017-08-22 DIAGNOSIS — N2581 Secondary hyperparathyroidism of renal origin: Secondary | ICD-10-CM | POA: Diagnosis not present

## 2017-08-22 DIAGNOSIS — Z992 Dependence on renal dialysis: Secondary | ICD-10-CM | POA: Diagnosis not present

## 2017-08-22 DIAGNOSIS — E1142 Type 2 diabetes mellitus with diabetic polyneuropathy: Secondary | ICD-10-CM | POA: Diagnosis not present

## 2017-08-22 DIAGNOSIS — N186 End stage renal disease: Secondary | ICD-10-CM | POA: Diagnosis not present

## 2017-08-22 DIAGNOSIS — D509 Iron deficiency anemia, unspecified: Secondary | ICD-10-CM | POA: Diagnosis not present

## 2017-08-23 DIAGNOSIS — N2581 Secondary hyperparathyroidism of renal origin: Secondary | ICD-10-CM | POA: Diagnosis not present

## 2017-08-23 DIAGNOSIS — Z992 Dependence on renal dialysis: Secondary | ICD-10-CM | POA: Diagnosis not present

## 2017-08-23 DIAGNOSIS — N186 End stage renal disease: Secondary | ICD-10-CM | POA: Diagnosis not present

## 2017-08-23 DIAGNOSIS — D509 Iron deficiency anemia, unspecified: Secondary | ICD-10-CM | POA: Diagnosis not present

## 2017-08-25 ENCOUNTER — Ambulatory Visit (INDEPENDENT_AMBULATORY_CARE_PROVIDER_SITE_OTHER): Payer: Medicare Other | Admitting: Orthopedic Surgery

## 2017-08-25 DIAGNOSIS — N2581 Secondary hyperparathyroidism of renal origin: Secondary | ICD-10-CM | POA: Diagnosis not present

## 2017-08-25 DIAGNOSIS — Z992 Dependence on renal dialysis: Secondary | ICD-10-CM | POA: Diagnosis not present

## 2017-08-25 DIAGNOSIS — N186 End stage renal disease: Secondary | ICD-10-CM | POA: Diagnosis not present

## 2017-08-25 DIAGNOSIS — D509 Iron deficiency anemia, unspecified: Secondary | ICD-10-CM | POA: Diagnosis not present

## 2017-08-26 ENCOUNTER — Ambulatory Visit (INDEPENDENT_AMBULATORY_CARE_PROVIDER_SITE_OTHER): Payer: Medicare Other | Admitting: Orthopedic Surgery

## 2017-08-26 ENCOUNTER — Encounter (INDEPENDENT_AMBULATORY_CARE_PROVIDER_SITE_OTHER): Payer: Self-pay | Admitting: Orthopedic Surgery

## 2017-08-26 DIAGNOSIS — L97411 Non-pressure chronic ulcer of right heel and midfoot limited to breakdown of skin: Secondary | ICD-10-CM

## 2017-08-26 DIAGNOSIS — N186 End stage renal disease: Secondary | ICD-10-CM | POA: Diagnosis not present

## 2017-08-26 DIAGNOSIS — Z89431 Acquired absence of right foot: Secondary | ICD-10-CM

## 2017-08-26 DIAGNOSIS — Z992 Dependence on renal dialysis: Secondary | ICD-10-CM | POA: Diagnosis not present

## 2017-08-26 NOTE — Progress Notes (Signed)
Office Visit Note   Patient: Joseph VOORHEIS Sr.           Date of Birth: [birth year]           MRN: [id number] Visit Date: 08/26/2017              Requested by: [name], MD [address] [place], [place] [address] PCP: [name], MD  Chief Complaint  Patient presents with  . Right Foot - Pain      HPI: Patient is a 77 year old gentleman whose over 3 months status post right transmetatarsal amputation.  He is currently ambulating in a postoperative shoe he denies any drainage or fever complains of painful callus over the end of the wound.  Assessment & Plan: Visit Diagnoses:  1. S/P transmetatarsal amputation of foot, right ([place])   2. Midfoot skin ulcer, right, limited to breakdown of skin [facility])     Plan: Patient may advance to regular shoewear follow-up in 3 months recommended moisturizing lotion.  Follow-Up Instructions: Return in about 3 months (around 11/25/2017).   Ortho Exam  Patient is alert, oriented, no adenopathy, well-dressed, normal affect, normal respiratory effort. Examination patient's foot is plantigrade he has dry cracked skin no venous stasis swelling.  There is no purulence no drainage.  He has a large ulcer over the end of the residual limb.  After informed consent a 10 blade knife was used to debride the skin and soft tissue back to bleeding viable granulation tissue there is no exposed bone or tendon no abscess.  The wound was 2 x 6 cm and 5 mm deep.  Band-Aids were applied.  Imaging: No results found. No images are attached to the encounter.  Labs: Lab Results  Component Value Date   REPTSTATUS 02/22/2017 FINAL 02/18/2017   GRAMSTAIN  02/18/2017    RARE WBC PRESENT, PREDOMINANTLY PMN FEW GRAM POSITIVE COCCI    CULT  02/18/2017    NO GROWTH AEROBICALLY MIXED ANAEROBIC FLORA PRESENT.  CALL LAB IF FURTHER IID REQUIRED.     @LABSALLVALUES (HGBA1)@  There is no height or weight on file to calculate BMI.  Orders:  No orders of  the defined types were placed in this encounter.  No orders of the defined types were placed in this encounter.    Procedures: No procedures performed  Clinical Data: No additional findings.  ROS:  All other systems negative, except as noted in the HPI. Review of Systems  Objective: Vital Signs: There were no vitals taken for this visit.  Specialty Comments:  No specialty comments available.  PMFS History: Patient Active Problem List   Diagnosis Date Noted  . S/P transmetatarsal amputation of foot, right ([place]) 05/09/2017  . PVD (peripheral vascular disease) ([place]) 04/15/2017  . Nonischemic cardiomyopathy ([place]) 08/15/2015  . Aortic stenosis 08/15/2015  . Moderate aortic stenosis 07/12/2015  . 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 CAD- 40-50% LAD at cath 07/11/15 07/11/2015  . Aspirin intolerance 07/11/2015  . Abnormal stress test   . ESRD on dialysis ([place]) 05/30/2015  . CTS (carpal tunnel syndrome) 02/28/2015  . PVD of LE - Dr Oneida Alar follows 08/20/2012  . Midfoot skin ulcer, right, limited to breakdown of skin ([place]) 01/16/2012  . Encounter for screening colonoscopy 10/08/2011  . Melena 10/08/2011  . Other complications due to renal dialysis device, implant, and graft 09/24/2011   Past Medical History:  Diagnosis Date  . Anxiety   . Aortic stenosis   . Arthritis   .  CVA (cerebral infarction)   . ESRD (end stage renal disease) on dialysis [facility])    M/W/F at [facility] in [place]  . Essential hypertension   . Gangrene of right foot ([place])   . Gastric ulcer 2004  . History of cardiomyopathy    LVEF normal as of February 2017  . History of stroke    Left side weakness  . Iron deficiency anemia     Family History  Problem Relation Age of Onset  . Hypertension Mother   . Colon cancer Neg Hx   . Liver disease Neg Hx     Past Surgical History:  Procedure Laterality Date  . ABDOMINAL AORTAGRAM N/A 01/24/2012   Procedure: ABDOMINAL Maxcine Ham;   Surgeon: Elam Dutch, MD;  Location: [facility] CATH LAB;  Service: Cardiovascular;  Laterality: N/A;  . ABDOMINAL AORTOGRAM W/LOWER EXTREMITY N/A 04/15/2017   Procedure: ABDOMINAL AORTOGRAM W/LOWER EXTREMITY;  Surgeon: Angelia Mould, MD;  Location: [place] CV LAB;  Service: Cardiovascular;  Laterality: N/A;  . AMPUTATION Right 05/09/2017   Procedure: RIGHT TRANSMETATARSAL AMPUTATION;  Surgeon: [name], MD;  Location: [place];  Service: Orthopedics;  Laterality: Right;  . ARTERIOVENOUS GRAFT PLACEMENT Right right arm  . AV FISTULA PLACEMENT Left 08/31/2015   Procedure: ARTERIOVENOUS (AV) FISTULA CREATION- LEFT RADIOCEPHALIC;  Surgeon: Mal Misty, MD;  Location: [place];  Service: Vascular;  Laterality: Left;  . AV FISTULA PLACEMENT Right 02/18/2017   Procedure: INSERTION OF ARTERIOVENOUS (AV) GORE-TEX GRAFT  RIGHT UPPER ARM;  Surgeon: Conrad [place], MD;  Location: [place];  Service: Vascular;  Laterality: Right;  . BASCILIC VEIN TRANSPOSITION Left 12/19/2015   Procedure: FIRST STAGE BRACHIAL VEIN TRANSPOSITION;  Surgeon: Conrad [place], MD;  Location: [place];  Service: Vascular;  Laterality: Left;  . BASCILIC VEIN TRANSPOSITION Left 02/08/2016   Procedure: SECOND STAGE BRACHIAL VEIN TRANSPOSITION;  Surgeon: Conrad , MD;  Location: [place];  Service: Vascular;  Laterality: Left;  . CARDIAC CATHETERIZATION N/A 07/11/2015   Procedure: Left Heart Cath and Coronary Angiography;  Surgeon: Troy Sine, MD;  Location: [place] CV LAB;  Service: Cardiovascular;  Laterality: N/A;  . Carpel Tunnel Left Dec. 22, 2016  . CHOLECYSTECTOMY    . COLONOSCOPY  2004   Dr. Irving Shows, left sided diverticula and cecal polyp, path unknown  . COLONOSCOPY  10/29/2011   Procedure: COLONOSCOPY;  Surgeon: Daneil Dolin, MD;  [facility];  Service: Endoscopy;  Laterality: N/A;  10:15  . ESOPHAGOGASTRODUODENOSCOPY  11/2002   Dr. Gala Romney, erosive reflux esophagitis, multiple gastric ulcer and  antral/bulbar erosions. Serologies positive for H.Pylori and was treated  . ESOPHAGOGASTRODUODENOSCOPY  11/20014   Dr. Gala Romney, small hh only, ulcers healed  . ESOPHAGOGASTRODUODENOSCOPY  09/21/2011   Dr Trevor Iha HH, antral erosions, ?early GAVE  . FISTULOGRAM Left 12/10/2016   Procedure: THROMBECTOMY OF LEFT ARM ARTERIOVENOUS FISTULA;  Surgeon: Waynetta Sandy, MD;  Location: [place];  Service: Vascular;  Laterality: Left;  . INSERTION OF DIALYSIS CATHETER Left 12/10/2016   Procedure: INSERTION OF TUNNELED DIALYSIS CATHETER;  Surgeon: Waynetta Sandy, MD;  Location: [place];  Service: Vascular;  Laterality: Left;  . IR GENERIC HISTORICAL  07/16/2016   IR REMOVAL TUN CV CATH W/O FL 07/16/2016 Saverio Danker, PA-C [facility]-INTERV [facility]  . IR REMOVAL TUN CV CATH W/O FL  05/12/2017  . LIGATION OF ARTERIOVENOUS  FISTULA Left 12/19/2015   Procedure: LIGATION OF RADIOCEPHALIC ARTERIOVENOUS  FISTULA;  Surgeon: Aaron Edelman  Starlyn Skeans, MD;  Location: [place];  Service: Vascular;  Laterality: Left;  Marland Kitchen MASS EXCISION Right 02/18/2017   Procedure: EXCISION OF RIGHT AXILLARY EPIDERMAL INCLUSION CYST;  Surgeon: Conrad Richland, MD;  Location: [place];  Service: Vascular;  Laterality: Right;  . PERIPHERAL VASCULAR CATHETERIZATION N/A 12/14/2015   Procedure: Fistulagram;  Surgeon: Conrad [place], MD;  Location: [place] CV LAB;  Service: Cardiovascular;  Laterality: N/A;  . SHOULDER SURGERY    . ULTRASOUND GUIDANCE FOR VASCULAR ACCESS  04/15/2017   Procedure: Ultrasound Guidance For Vascular Access;  Surgeon: Angelia Mould, MD;  Location: [place] CV LAB;  Service: Cardiovascular;;  . UPPER EXTREMITY VENOGRAPHY Bilateral 12/17/2016   Procedure: Bilateral Upper Extremity Venography;  Surgeon: Serafina Mitchell, MD;  Location: [place] CV LAB;  Service: Cardiovascular;  Laterality: Bilateral;   Social History   Occupational History  . Occupation: retired, Licensed conveyancer    Employer: RETIRED  Tobacco Use  .  Smoking status: Former Smoker    Last attempt to quit: 04/29/2004    Years since quitting: 13.3  . Smokeless tobacco: Former Systems developer    Types: Chew    Quit date: 01/16/1987  . Tobacco comment: quit 2006  Substance and Sexual Activity  . Alcohol use: No  . Drug use: No  . Sexual activity: Not on file

## 2017-08-27 DIAGNOSIS — N2581 Secondary hyperparathyroidism of renal origin: Secondary | ICD-10-CM | POA: Diagnosis not present

## 2017-08-27 DIAGNOSIS — N186 End stage renal disease: Secondary | ICD-10-CM | POA: Diagnosis not present

## 2017-08-27 DIAGNOSIS — Z992 Dependence on renal dialysis: Secondary | ICD-10-CM | POA: Diagnosis not present

## 2017-08-29 DIAGNOSIS — N2581 Secondary hyperparathyroidism of renal origin: Secondary | ICD-10-CM | POA: Diagnosis not present

## 2017-08-29 DIAGNOSIS — Z992 Dependence on renal dialysis: Secondary | ICD-10-CM | POA: Diagnosis not present

## 2017-08-29 DIAGNOSIS — N186 End stage renal disease: Secondary | ICD-10-CM | POA: Diagnosis not present

## 2017-09-01 DIAGNOSIS — N2581 Secondary hyperparathyroidism of renal origin: Secondary | ICD-10-CM | POA: Diagnosis not present

## 2017-09-01 DIAGNOSIS — N186 End stage renal disease: Secondary | ICD-10-CM | POA: Diagnosis not present

## 2017-09-01 DIAGNOSIS — Z992 Dependence on renal dialysis: Secondary | ICD-10-CM | POA: Diagnosis not present

## 2017-09-03 DIAGNOSIS — Z992 Dependence on renal dialysis: Secondary | ICD-10-CM | POA: Diagnosis not present

## 2017-09-03 DIAGNOSIS — N186 End stage renal disease: Secondary | ICD-10-CM | POA: Diagnosis not present

## 2017-09-03 DIAGNOSIS — N2581 Secondary hyperparathyroidism of renal origin: Secondary | ICD-10-CM | POA: Diagnosis not present

## 2017-09-05 DIAGNOSIS — N2581 Secondary hyperparathyroidism of renal origin: Secondary | ICD-10-CM | POA: Diagnosis not present

## 2017-09-05 DIAGNOSIS — Z992 Dependence on renal dialysis: Secondary | ICD-10-CM | POA: Diagnosis not present

## 2017-09-05 DIAGNOSIS — N186 End stage renal disease: Secondary | ICD-10-CM | POA: Diagnosis not present

## 2017-09-08 DIAGNOSIS — Z992 Dependence on renal dialysis: Secondary | ICD-10-CM | POA: Diagnosis not present

## 2017-09-08 DIAGNOSIS — N186 End stage renal disease: Secondary | ICD-10-CM | POA: Diagnosis not present

## 2017-09-08 DIAGNOSIS — N2581 Secondary hyperparathyroidism of renal origin: Secondary | ICD-10-CM | POA: Diagnosis not present

## 2017-09-10 DIAGNOSIS — N186 End stage renal disease: Secondary | ICD-10-CM | POA: Diagnosis not present

## 2017-09-10 DIAGNOSIS — N2581 Secondary hyperparathyroidism of renal origin: Secondary | ICD-10-CM | POA: Diagnosis not present

## 2017-09-10 DIAGNOSIS — Z992 Dependence on renal dialysis: Secondary | ICD-10-CM | POA: Diagnosis not present

## 2017-09-12 DIAGNOSIS — Z992 Dependence on renal dialysis: Secondary | ICD-10-CM | POA: Diagnosis not present

## 2017-09-12 DIAGNOSIS — L97521 Non-pressure chronic ulcer of other part of left foot limited to breakdown of skin: Secondary | ICD-10-CM | POA: Diagnosis not present

## 2017-09-12 DIAGNOSIS — N2581 Secondary hyperparathyroidism of renal origin: Secondary | ICD-10-CM | POA: Diagnosis not present

## 2017-09-12 DIAGNOSIS — N186 End stage renal disease: Secondary | ICD-10-CM | POA: Diagnosis not present

## 2017-09-12 DIAGNOSIS — E1142 Type 2 diabetes mellitus with diabetic polyneuropathy: Secondary | ICD-10-CM | POA: Diagnosis not present

## 2017-09-15 DIAGNOSIS — N2581 Secondary hyperparathyroidism of renal origin: Secondary | ICD-10-CM | POA: Diagnosis not present

## 2017-09-15 DIAGNOSIS — N186 End stage renal disease: Secondary | ICD-10-CM | POA: Diagnosis not present

## 2017-09-15 DIAGNOSIS — Z992 Dependence on renal dialysis: Secondary | ICD-10-CM | POA: Diagnosis not present

## 2017-09-17 ENCOUNTER — Ambulatory Visit (INDEPENDENT_AMBULATORY_CARE_PROVIDER_SITE_OTHER): Payer: Medicare Other | Admitting: Orthopedic Surgery

## 2017-09-17 ENCOUNTER — Encounter (INDEPENDENT_AMBULATORY_CARE_PROVIDER_SITE_OTHER): Payer: Self-pay | Admitting: Orthopedic Surgery

## 2017-09-17 DIAGNOSIS — Z992 Dependence on renal dialysis: Secondary | ICD-10-CM | POA: Diagnosis not present

## 2017-09-17 DIAGNOSIS — Z89431 Acquired absence of right foot: Secondary | ICD-10-CM | POA: Diagnosis not present

## 2017-09-17 DIAGNOSIS — N186 End stage renal disease: Secondary | ICD-10-CM | POA: Diagnosis not present

## 2017-09-17 DIAGNOSIS — N2581 Secondary hyperparathyroidism of renal origin: Secondary | ICD-10-CM | POA: Diagnosis not present

## 2017-09-17 MED ORDER — SILVER SULFADIAZINE 1 % EX CREA: 1.0000 "application " | TOPICAL_CREAM | Freq: Every day | CUTANEOUS | 0 refills | Status: DC

## 2017-09-17 NOTE — Progress Notes (Signed)
Office Visit Note   Patient: Joseph FORMBY Sr.           Date of Birth: 10/19/1940           MRN: [id number] Visit Date: 09/17/2017              Requested by: [name], MD [address] [place], [place] [address] PCP: [name], MD  Chief Complaint  Patient presents with  . Right Foot - Follow-up, Routine Post Op      HPI: Patient is a 77 year old gentleman who is status post right transmetatarsal amputation on 04/29/17.  He is currently ambulating in a postoperative shoe. he denies any drainage or fever. complains of painful callus over the medial aspect of amputation.  Assessment & Plan: Visit Diagnoses:  No diagnosis found.  Plan: Patient to limit weight bearing. Continue daily wound care. Silvadene refilled. Follow up in 4 weeks.   Follow-Up Instructions: No follow-ups on file.   Ortho Exam  Patient is alert, oriented, no adenopathy, well-dressed, normal affect, normal respiratory effort. Examination patient's foot is plantigrade he has dry cracked skin no venous stasis swelling.  There is no purulence no drainage.  He has a large ulcer over the medial transmetatarsal amputation.  After informed consent a 10 blade knife was used to debride the skin and soft tissue back to bleeding viable granulation tissue there is no exposed bone or tendon no abscess.  The wound was 2 cm in diameter and 5 mm deep.  iodosorb dressing applied.  Imaging: No results found. No images are attached to the encounter.  Labs: Lab Results  Component Value Date   REPTSTATUS 02/22/2017 FINAL 02/18/2017   GRAMSTAIN  02/18/2017    RARE WBC PRESENT, PREDOMINANTLY PMN FEW GRAM POSITIVE COCCI    CULT  02/18/2017    NO GROWTH AEROBICALLY MIXED ANAEROBIC FLORA PRESENT.  CALL LAB IF FURTHER IID REQUIRED.     @LABSALLVALUES (HGBA1)@  There is no height or weight on file to calculate BMI.  Orders:  No orders of the defined types were placed in this encounter.  No orders of the  defined types were placed in this encounter.    Procedures: No procedures performed  Clinical Data: No additional findings.  ROS:  All other systems negative, except as noted in the HPI. Review of Systems  Constitutional: Negative for chills and fever.  Cardiovascular: Negative for leg swelling.  Skin: Positive for wound. Negative for color change.    Objective: Vital Signs: There were no vitals taken for this visit.  Specialty Comments:  No specialty comments available.  PMFS History: Patient Active Problem List   Diagnosis Date Noted  . S/P transmetatarsal amputation of foot, right ([place]) 05/09/2017  . PVD (peripheral vascular disease) ([place]) 04/15/2017  . Nonischemic cardiomyopathy ([place]) 08/15/2015  . Aortic stenosis 08/15/2015  . Moderate aortic stenosis 07/12/2015  . 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 CAD- 40-50% LAD at cath 07/11/15 07/11/2015  . Aspirin intolerance 07/11/2015  . Abnormal stress test   . ESRD on dialysis ([place]) 05/30/2015  . CTS (carpal tunnel syndrome) 02/28/2015  . PVD of LE - Dr [place] Alar follows 08/20/2012  . Midfoot skin ulcer, right, limited to breakdown of skin ([place]) 01/16/2012  . Encounter for screening colonoscopy 10/08/2011  . Melena 10/08/2011  . Other complications due to renal dialysis device, implant, and graft 09/24/2011   Past Medical History:  Diagnosis Date  . Anxiety   . Aortic stenosis   .  Arthritis   . CVA (cerebral infarction)   . ESRD (end stage renal disease) on dialysis [facility])    M/W/F at [facility] in [place]  . Essential hypertension   . Gangrene of right foot ([place])   . Gastric ulcer 2004  . History of cardiomyopathy    LVEF normal as of February 2017  . History of stroke    Left side weakness  . Iron deficiency anemia     Family History  Problem Relation Age of Onset  . Hypertension Mother   . Colon cancer Neg Hx   . Liver disease Neg Hx     Past Surgical History:  Procedure Laterality  Date  . ABDOMINAL AORTAGRAM N/A 01/24/2012   Procedure: ABDOMINAL Maxcine Ham;  Surgeon: Elam Dutch, MD;  Location: [facility] CATH LAB;  Service: Cardiovascular;  Laterality: N/A;  . ABDOMINAL AORTOGRAM W/LOWER EXTREMITY N/A 04/15/2017   Procedure: ABDOMINAL AORTOGRAM W/LOWER EXTREMITY;  Surgeon: Angelia Mould, MD;  Location: [place] CV LAB;  Service: Cardiovascular;  Laterality: N/A;  . AMPUTATION Right 05/09/2017   Procedure: RIGHT TRANSMETATARSAL AMPUTATION;  Surgeon: [name], MD;  Location: [place];  Service: Orthopedics;  Laterality: Right;  . ARTERIOVENOUS GRAFT PLACEMENT Right right arm  . AV FISTULA PLACEMENT Left 08/31/2015   Procedure: ARTERIOVENOUS (AV) FISTULA CREATION- LEFT RADIOCEPHALIC;  Surgeon: Mal Misty, MD;  Location: [place];  Service: Vascular;  Laterality: Left;  . AV FISTULA PLACEMENT Right 02/18/2017   Procedure: INSERTION OF ARTERIOVENOUS (AV) GORE-TEX GRAFT  RIGHT UPPER ARM;  Surgeon: Conrad [place]n Athyn, MD;  Location: [place];  Service: Vascular;  Laterality: Right;  . BASCILIC VEIN TRANSPOSITION Left 12/19/2015   Procedure: FIRST STAGE BRACHIAL VEIN TRANSPOSITION;  Surgeon: Conrad , MD;  Location: [place];  Service: Vascular;  Laterality: Left;  . BASCILIC VEIN TRANSPOSITION Left 02/08/2016   Procedure: SECOND STAGE BRACHIAL VEIN TRANSPOSITION;  Surgeon: Conrad , MD;  Location: [place];  Service: Vascular;  Laterality: Left;  . CARDIAC CATHETERIZATION N/A 07/11/2015   Procedure: Left Heart Cath and Coronary Angiography;  Surgeon: Troy Sine, MD;  Location: [place] CV LAB;  Service: Cardiovascular;  Laterality: N/A;  . Carpel Tunnel Left Dec. 22, 2016  . CHOLECYSTECTOMY    . COLONOSCOPY  2004   Dr. Irving Shows, left sided diverticula and cecal polyp, path unknown  . COLONOSCOPY  10/29/2011   Procedure: COLONOSCOPY;  Surgeon: Daneil Dolin, MD;  [facility];  Service: Endoscopy;  Laterality: N/A;  10:15  . ESOPHAGOGASTRODUODENOSCOPY   11/2002   Dr. Gala Romney, erosive reflux esophagitis, multiple gastric ulcer and antral/bulbar erosions. Serologies positive for H.Pylori and was treated  . ESOPHAGOGASTRODUODENOSCOPY  11/20014   Dr. Gala Romney, small hh only, ulcers healed  . ESOPHAGOGASTRODUODENOSCOPY  09/21/2011   Dr Trevor Iha [facility], antral erosions, ?early GAVE  . FISTULOGRAM Left 12/10/2016   Procedure: THROMBECTOMY OF LEFT ARM ARTERIOVENOUS FISTULA;  Surgeon: Waynetta Sandy, MD;  Location: [place];  Service: Vascular;  Laterality: Left;  . INSERTION OF DIALYSIS CATHETER Left 12/10/2016   Procedure: INSERTION OF TUNNELED DIALYSIS CATHETER;  Surgeon: Waynetta Sandy, MD;  Location: [place];  Service: Vascular;  Laterality: Left;  . IR GENERIC HISTORICAL  07/16/2016   IR REMOVAL TUN CV CATH W/O FL 07/16/2016 Saverio Danker, PA-C [facility]-INTER[facility] RAD  . IR REMOVAL TUN CV CATH W/O FL  05/12/2017  . LIGATION OF ARTERIOVENOUS  FISTULA Left 12/19/2015   Procedure: LIGATION OF RADIOCEPHALIC ARTERIOVENOUS  FISTULA;  Surgeon: Conrad Franklin, MD;  Location: [place];  Service: Vascular;  Laterality: Left;  Marland Kitchen MASS EXCISION Right 02/18/2017   Procedure: EXCISION OF RIGHT AXILLARY EPIDERMAL INCLUSION CYST;  Surgeon: Conrad Lyons, MD;  Location: [place];  Service: Vascular;  Laterality: Right;  . PERIPHERAL VASCULAR CATHETERIZATION N/A 12/14/2015   Procedure: Fistulagram;  Surgeon: Conrad [place], MD;  Location: [place] CV LAB;  Service: Cardiovascular;  Laterality: N/A;  . SHOULDER SURGERY    . ULTRASOUND GUIDANCE FOR VASCULAR ACCESS  04/15/2017   Procedure: Ultrasound Guidance For Vascular Access;  Surgeon: Angelia Mould, MD;  Location: [place] CV LAB;  Service: Cardiovascular;;  . UPPER EXTREMITY VENOGRAPHY Bilateral 12/17/2016   Procedure: Bilateral Upper Extremity Venography;  Surgeon: Serafina Mitchell, MD;  Location: [place] CV LAB;  Service: Cardiovascular;  Laterality: Bilateral;   Social History   Occupational History  .  Occupation: retired, Licensed conveyancer    Employer: RETIRED  Tobacco Use  . Smoking status: Former Smoker    Last attempt to quit: 04/29/2004    Years since quitting: 13.3  . Smokeless tobacco: Former Systems developer    Types: Chew    Quit date: 01/16/1987  . Tobacco comment: quit 2006  Substance and Sexual Activity  . Alcohol use: No  . Drug use: No  . Sexual activity: Not on file

## 2017-09-19 DIAGNOSIS — N186 End stage renal disease: Secondary | ICD-10-CM | POA: Diagnosis not present

## 2017-09-19 DIAGNOSIS — N2581 Secondary hyperparathyroidism of renal origin: Secondary | ICD-10-CM | POA: Diagnosis not present

## 2017-09-19 DIAGNOSIS — Z992 Dependence on renal dialysis: Secondary | ICD-10-CM | POA: Diagnosis not present

## 2017-09-22 DIAGNOSIS — Z992 Dependence on renal dialysis: Secondary | ICD-10-CM | POA: Diagnosis not present

## 2017-09-22 DIAGNOSIS — N186 End stage renal disease: Secondary | ICD-10-CM | POA: Diagnosis not present

## 2017-09-22 DIAGNOSIS — N2581 Secondary hyperparathyroidism of renal origin: Secondary | ICD-10-CM | POA: Diagnosis not present

## 2017-09-24 ENCOUNTER — Telehealth (INDEPENDENT_AMBULATORY_CARE_PROVIDER_SITE_OTHER): Payer: Self-pay | Admitting: Orthopedic Surgery

## 2017-09-24 DIAGNOSIS — N186 End stage renal disease: Secondary | ICD-10-CM | POA: Diagnosis not present

## 2017-09-24 DIAGNOSIS — Z992 Dependence on renal dialysis: Secondary | ICD-10-CM | POA: Diagnosis not present

## 2017-09-24 DIAGNOSIS — N2581 Secondary hyperparathyroidism of renal origin: Secondary | ICD-10-CM | POA: Diagnosis not present

## 2017-09-24 NOTE — Telephone Encounter (Signed)
Last 2 ov notes faxed to [facility] [phone] °

## 2017-09-26 DIAGNOSIS — N186 End stage renal disease: Secondary | ICD-10-CM | POA: Diagnosis not present

## 2017-09-26 DIAGNOSIS — N2581 Secondary hyperparathyroidism of renal origin: Secondary | ICD-10-CM | POA: Diagnosis not present

## 2017-09-26 DIAGNOSIS — Z992 Dependence on renal dialysis: Secondary | ICD-10-CM | POA: Diagnosis not present

## 2017-09-27 DIAGNOSIS — N2581 Secondary hyperparathyroidism of renal origin: Secondary | ICD-10-CM | POA: Diagnosis not present

## 2017-09-27 DIAGNOSIS — Z992 Dependence on renal dialysis: Secondary | ICD-10-CM | POA: Diagnosis not present

## 2017-09-27 DIAGNOSIS — N186 End stage renal disease: Secondary | ICD-10-CM | POA: Diagnosis not present

## 2017-09-29 DIAGNOSIS — N2581 Secondary hyperparathyroidism of renal origin: Secondary | ICD-10-CM | POA: Diagnosis not present

## 2017-09-29 DIAGNOSIS — Z992 Dependence on renal dialysis: Secondary | ICD-10-CM | POA: Diagnosis not present

## 2017-09-29 DIAGNOSIS — N186 End stage renal disease: Secondary | ICD-10-CM | POA: Diagnosis not present

## 2017-10-01 DIAGNOSIS — Z992 Dependence on renal dialysis: Secondary | ICD-10-CM | POA: Diagnosis not present

## 2017-10-01 DIAGNOSIS — N186 End stage renal disease: Secondary | ICD-10-CM | POA: Diagnosis not present

## 2017-10-01 DIAGNOSIS — N2581 Secondary hyperparathyroidism of renal origin: Secondary | ICD-10-CM | POA: Diagnosis not present

## 2017-10-03 DIAGNOSIS — N2581 Secondary hyperparathyroidism of renal origin: Secondary | ICD-10-CM | POA: Diagnosis not present

## 2017-10-03 DIAGNOSIS — Z992 Dependence on renal dialysis: Secondary | ICD-10-CM | POA: Diagnosis not present

## 2017-10-03 DIAGNOSIS — N186 End stage renal disease: Secondary | ICD-10-CM | POA: Diagnosis not present

## 2017-10-06 DIAGNOSIS — Z992 Dependence on renal dialysis: Secondary | ICD-10-CM | POA: Diagnosis not present

## 2017-10-06 DIAGNOSIS — N186 End stage renal disease: Secondary | ICD-10-CM | POA: Diagnosis not present

## 2017-10-06 DIAGNOSIS — N2581 Secondary hyperparathyroidism of renal origin: Secondary | ICD-10-CM | POA: Diagnosis not present

## 2017-10-07 DIAGNOSIS — K219 Gastro-esophageal reflux disease without esophagitis: Secondary | ICD-10-CM | POA: Diagnosis not present

## 2017-10-07 DIAGNOSIS — I5022 Chronic systolic (congestive) heart failure: Secondary | ICD-10-CM | POA: Diagnosis not present

## 2017-10-07 DIAGNOSIS — N186 End stage renal disease: Secondary | ICD-10-CM | POA: Diagnosis not present

## 2017-10-07 DIAGNOSIS — I10 Essential (primary) hypertension: Secondary | ICD-10-CM | POA: Diagnosis not present

## 2017-10-08 ENCOUNTER — Ambulatory Visit (INDEPENDENT_AMBULATORY_CARE_PROVIDER_SITE_OTHER): Payer: Medicare Other | Admitting: Orthopedic Surgery

## 2017-10-08 DIAGNOSIS — N186 End stage renal disease: Secondary | ICD-10-CM | POA: Diagnosis not present

## 2017-10-08 DIAGNOSIS — N2581 Secondary hyperparathyroidism of renal origin: Secondary | ICD-10-CM | POA: Diagnosis not present

## 2017-10-08 DIAGNOSIS — Z992 Dependence on renal dialysis: Secondary | ICD-10-CM | POA: Diagnosis not present

## 2017-10-10 DIAGNOSIS — Z992 Dependence on renal dialysis: Secondary | ICD-10-CM | POA: Diagnosis not present

## 2017-10-10 DIAGNOSIS — N186 End stage renal disease: Secondary | ICD-10-CM | POA: Diagnosis not present

## 2017-10-10 DIAGNOSIS — N2581 Secondary hyperparathyroidism of renal origin: Secondary | ICD-10-CM | POA: Diagnosis not present

## 2017-10-13 DIAGNOSIS — N186 End stage renal disease: Secondary | ICD-10-CM | POA: Diagnosis not present

## 2017-10-13 DIAGNOSIS — Z992 Dependence on renal dialysis: Secondary | ICD-10-CM | POA: Diagnosis not present

## 2017-10-13 DIAGNOSIS — N2581 Secondary hyperparathyroidism of renal origin: Secondary | ICD-10-CM | POA: Diagnosis not present

## 2017-10-15 DIAGNOSIS — N2581 Secondary hyperparathyroidism of renal origin: Secondary | ICD-10-CM | POA: Diagnosis not present

## 2017-10-15 DIAGNOSIS — Z992 Dependence on renal dialysis: Secondary | ICD-10-CM | POA: Diagnosis not present

## 2017-10-15 DIAGNOSIS — N186 End stage renal disease: Secondary | ICD-10-CM | POA: Diagnosis not present

## 2017-10-16 ENCOUNTER — Encounter (INDEPENDENT_AMBULATORY_CARE_PROVIDER_SITE_OTHER): Payer: Self-pay | Admitting: Orthopedic Surgery

## 2017-10-16 ENCOUNTER — Ambulatory Visit (INDEPENDENT_AMBULATORY_CARE_PROVIDER_SITE_OTHER): Payer: Medicare Other | Admitting: Orthopedic Surgery

## 2017-10-16 VITALS — Ht 65.0 in | Wt 165.0 lb

## 2017-10-16 DIAGNOSIS — Z89431 Acquired absence of right foot: Secondary | ICD-10-CM | POA: Diagnosis not present

## 2017-10-16 DIAGNOSIS — L97411 Non-pressure chronic ulcer of right heel and midfoot limited to breakdown of skin: Secondary | ICD-10-CM

## 2017-10-16 NOTE — Progress Notes (Signed)
Office Visit Note   Patient: Joseph KRAPF Sr.           Date of Birth: 1940-10-13           MRN: [id number] Visit Date: 10/16/2017              Requested by: [name], MD [address] [place], [place] [address] PCP: [name], MD  Chief Complaint  Patient presents with  . Right Foot - Routine Post Op    05/09/17 right transmet amputation       HPI: Patient is a 77 year old gentleman who was seen in follow-up for [place] grade 1 ulce right foot status post transmetatarsal amputation about 5 months ago.  Patient dates he just got his extra-depth shoes with custom orthotics.  Patient complains of ulceration over the forefoot he states he is doing Silvadene dressing changes daily.  Assessment & Plan: Visit Diagnoses:  1. S/P transmetatarsal amputation of foot, right ([place])   2. Midfoot skin ulcer, right, limited to breakdown of skin ([place])     Plan: Ulcer was debrided of skin and soft tissue back to healthy viable tissue.  Patient will continue with Silvadene dressing changes minimize his weightbearing follow-up in 3 weeks  Follow-Up Instructions: Return in about 3 weeks (around 11/06/2017).   Ortho Exam  Patient is alert, oriented, no adenopathy, well-dressed, normal affect, normal respiratory effort. Examination patient has increased callus and necrotic tissue over the transmetatarsal amputation.  After informed consent a 10 blade knife was used to debride the skin and soft tissue back to bleeding viable granulation tissue this was touched with silver nitrate.  The ulcer was 9 x 3 cm and 5 mm deep.  This did not probe to bone or tendon there is no abscess no signs of any deep infection.  The ulcerations most likely due to patient's ambulation without his shoe and orthotic that he has just received.  Imaging: No results found. No images are attached to the encounter.  Labs: Lab Results  Component Value Date   REPTSTATUS 02/22/2017 FINAL 02/18/2017   GRAMSTAIN   02/18/2017    RARE WBC PRESENT, PREDOMINANTLY PMN FEW GRAM POSITIVE COCCI    CULT  02/18/2017    NO GROWTH AEROBICALLY MIXED ANAEROBIC FLORA PRESENT.  CALL LAB IF FURTHER IID REQUIRED.      Lab Results  Component Value Date   ALBUMIN 2.9 (L) 05/12/2017   ALBUMIN 3.0 (L) 05/10/2017   ALBUMIN 3.3 (L) 03/31/2017    Body mass index is 27.46 kg/m.  Orders:  No orders of the defined types were placed in this encounter.  No orders of the defined types were placed in this encounter.    Procedures: No procedures performed  Clinical Data: No additional findings.  ROS:  All other systems negative, except as noted in the HPI. Review of Systems  Objective: Vital Signs: Ht 5\' 5"  (1.651 m)   Wt 165 lb (74.8 kg)   BMI 27.46 kg/m   Specialty Comments:  No specialty comments available.  PMFS History: Patient Active Problem List   Diagnosis Date Noted  . S/P transmetatarsal amputation of foot, right ([place]) 05/09/2017  . PVD (peripheral vascular disease) ([place]) 04/15/2017  . Nonischemic cardiomyopathy ([place]) 08/15/2015  . Aortic stenosis 08/15/2015  . Moderate aortic stenosis 07/12/2015  . 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 CAD- 40-50% LAD at cath 07/11/15 07/11/2015  . Aspirin intolerance 07/11/2015  . Abnormal stress test   . ESRD on  dialysis ([place]) 05/30/2015  . CTS (carpal tunnel syndrome) 02/28/2015  . PVD of LE - Dr Oneida Alar follows 08/20/2012  . Midfoot skin ulcer, right, limited to breakdown of skin ([place]) 01/16/2012  . Encounter for screening colonoscopy 10/08/2011  . Melena 10/08/2011  . Other complications due to renal dialysis device, implant, and graft 09/24/2011   Past Medical History:  Diagnosis Date  . Anxiety   . Aortic stenosis   . Arthritis   . CVA (cerebral infarction)   . ESRD (end stage renal disease) on dialysis [facility])    M/W/F at [facility] in [place]  . Essential hypertension   . Gangrene of right foot ([place])   . Gastric ulcer  2004  . History of cardiomyopathy    LVEF normal as of February 2017  . History of stroke    Left side weakness  . Iron deficiency anemia     Family History  Problem Relation Age of Onset  . Hypertension Mother   . Colon cancer Neg Hx   . Liver disease Neg Hx     Past Surgical History:  Procedure Laterality Date  . ABDOMINAL AORTAGRAM N/A 01/24/2012   Procedure: ABDOMINAL Maxcine Ham;  Surgeon: Elam Dutch, MD;  Location: [facility] CATH LAB;  Service: Cardiovascular;  Laterality: N/A;  . ABDOMINAL AORTOGRAM W/LOWER EXTREMITY N/A 04/15/2017   Procedure: ABDOMINAL AORTOGRAM W/LOWER EXTREMITY;  Surgeon: Angelia Mould, MD;  Location: [place] CV LAB;  Service: Cardiovascular;  Laterality: N/A;  . AMPUTATION Right 05/09/2017   Procedure: RIGHT TRANSMETATARSAL AMPUTATION;  Surgeon: [name], MD;  Location: [place];  Service: Orthopedics;  Laterality: Right;  . ARTERIOVENOUS GRAFT PLACEMENT Right right arm  . AV FISTULA PLACEMENT Left 08/31/2015   Procedure: ARTERIOVENOUS (AV) FISTULA CREATION- LEFT RADIOCEPHALIC;  Surgeon: Mal Misty, MD;  Location: [place];  Service: Vascular;  Laterality: Left;  . AV FISTULA PLACEMENT Right 02/18/2017   Procedure: INSERTION OF ARTERIOVENOUS (AV) GORE-TEX GRAFT  RIGHT UPPER ARM;  Surgeon: Conrad Farwell, MD;  Location: [place];  Service: Vascular;  Laterality: Right;  . BASCILIC VEIN TRANSPOSITION Left 12/19/2015   Procedure: FIRST STAGE BRACHIAL VEIN TRANSPOSITION;  Surgeon: Conrad [place], MD;  Location: [place];  Service: Vascular;  Laterality: Left;  . BASCILIC VEIN TRANSPOSITION Left 02/08/2016   Procedure: SECOND STAGE BRACHIAL VEIN TRANSPOSITION;  Surgeon: Conrad [place], MD;  Location: [place];  Service: Vascular;  Laterality: Left;  . CARDIAC CATHETERIZATION N/A 07/11/2015   Procedure: Left Heart Cath and Coronary Angiography;  Surgeon: Troy Sine, MD;  Location: [place] CV LAB;  Service: Cardiovascular;  Laterality: N/A;  . Carpel Tunnel  Left Dec. 22, 2016  . CHOLECYSTECTOMY    . COLONOSCOPY  2004   Dr. Irving Shows, left sided diverticula and cecal polyp, path unknown  . COLONOSCOPY  10/29/2011   Procedure: COLONOSCOPY;  Surgeon: Daneil Dolin, MD;  [facility];  Service: Endoscopy;  Laterality: N/A;  10:15  . ESOPHAGOGASTRODUODENOSCOPY  11/2002   Dr. Gala Romney, erosive reflux esophagitis, multiple gastric ulcer and antral/bulbar erosions. Serologies positive for H.Pylori and was treated  . ESOPHAGOGASTRODUODENOSCOPY  11/20014   Dr. Gala Romney, small hh only, ulcers healed  . ESOPHAGOGASTRODUODENOSCOPY  09/21/2011   Dr Trevor Iha HH, antral erosions, ?early GAVE  . FISTULOGRAM Left 12/10/2016   Procedure: THROMBECTOMY OF LEFT ARM ARTERIOVENOUS FISTULA;  Surgeon: Waynetta Sandy, MD;  Location: [place];  Service: Vascular;  Laterality: Left;  . INSERTION OF DIALYSIS CATHETER  Left 12/10/2016   Procedure: INSERTION OF TUNNELED DIALYSIS CATHETER;  Surgeon: Waynetta Sandy, MD;  Location: [place];  Service: Vascular;  Laterality: Left;  . IR GENERIC HISTORICAL  07/16/2016   IR REMOVAL TUN CV CATH W/O FL 07/16/2016 Saverio Danker, PA-C [facility]  . IR REMOVAL TUN CV CATH W/O FL  05/12/2017  . LIGATION OF ARTERIOVENOUS  FISTULA Left 12/19/2015   Procedure: LIGATION OF RADIOCEPHALIC ARTERIOVENOUS  FISTULA;  Surgeon: Conrad [place], MD;  Location: [place];  Service: Vascular;  Laterality: Left;  Marland Kitchen MASS EXCISION Right 02/18/2017   Procedure: EXCISION OF RIGHT AXILLARY EPIDERMAL INCLUSION CYST;  Surgeon: Conrad [place]a, MD;  Location: [place];  Service: Vascular;  Laterality: Right;  . PERIPHERAL VASCULAR CATHETERIZATION N/A 12/14/2015   Procedure: Fistulagram;  Surgeon: Conrad [place], MD;  Location: [place] CV LAB;  Service: Cardiovascular;  Laterality: N/A;  . SHOULDER SURGERY    . ULTRASOUND GUIDANCE FOR VASCULAR ACCESS  04/15/2017   Procedure: Ultrasound Guidance For Vascular Access;  Surgeon: Angelia Mould, MD;   Location: [place] CV LAB;  Service: Cardiovascular;;  . UPPER EXTREMITY VENOGRAPHY Bilateral 12/17/2016   Procedure: Bilateral Upper Extremity Venography;  Surgeon: Serafina Mitchell, MD;  Location: [place] CV LAB;  Service: Cardiovascular;  Laterality: Bilateral;   Social History   Occupational History  . Occupation: retired, Licensed conveyancer    Employer: RETIRED  Tobacco Use  . Smoking status: Former Smoker    Last attempt to quit: 04/29/2004    Years since quitting: 13.4  . Smokeless tobacco: Former Systems developer    Types: Chew    Quit date: 01/16/1987  . Tobacco comment: quit 2006  Substance and Sexual Activity  . Alcohol use: No  . Drug use: No  . Sexual activity: Not on file

## 2017-10-17 DIAGNOSIS — Z992 Dependence on renal dialysis: Secondary | ICD-10-CM | POA: Diagnosis not present

## 2017-10-17 DIAGNOSIS — N186 End stage renal disease: Secondary | ICD-10-CM | POA: Diagnosis not present

## 2017-10-17 DIAGNOSIS — N2581 Secondary hyperparathyroidism of renal origin: Secondary | ICD-10-CM | POA: Diagnosis not present

## 2017-10-20 ENCOUNTER — Other Ambulatory Visit: Payer: Self-pay | Admitting: Radiology

## 2017-10-20 ENCOUNTER — Other Ambulatory Visit (HOSPITAL_COMMUNITY): Payer: Self-pay | Admitting: Nephrology

## 2017-10-20 DIAGNOSIS — N186 End stage renal disease: Secondary | ICD-10-CM

## 2017-10-21 ENCOUNTER — Encounter (HOSPITAL_COMMUNITY): Payer: Self-pay | Admitting: Interventional Radiology

## 2017-10-21 ENCOUNTER — Ambulatory Visit (HOSPITAL_COMMUNITY)
Admission: RE | Admit: 2017-10-21 | Discharge: 2017-10-21 | Disposition: A | Discharge status: Home / Self Care | Payer: Medicare Other | Source: Ambulatory Visit | Attending: Nephrology | Admitting: Nephrology

## 2017-10-21 ENCOUNTER — Other Ambulatory Visit (HOSPITAL_COMMUNITY): Payer: Self-pay | Admitting: Nephrology

## 2017-10-21 DIAGNOSIS — Z87891 Personal history of nicotine dependence: Secondary | ICD-10-CM | POA: Diagnosis not present

## 2017-10-21 DIAGNOSIS — N186 End stage renal disease: Secondary | ICD-10-CM | POA: Insufficient documentation

## 2017-10-21 DIAGNOSIS — T82858A Stenosis of vascular prosthetic devices, implants and grafts, initial encounter: Secondary | ICD-10-CM | POA: Diagnosis not present

## 2017-10-21 DIAGNOSIS — I120 Hypertensive chronic kidney disease with stage 5 chronic kidney disease or end stage renal disease: Secondary | ICD-10-CM | POA: Insufficient documentation

## 2017-10-21 DIAGNOSIS — F419 Anxiety disorder, unspecified: Secondary | ICD-10-CM | POA: Diagnosis not present

## 2017-10-21 DIAGNOSIS — M1990 Unspecified osteoarthritis, unspecified site: Secondary | ICD-10-CM | POA: Diagnosis not present

## 2017-10-21 DIAGNOSIS — I871 Compression of vein: Secondary | ICD-10-CM | POA: Insufficient documentation

## 2017-10-21 DIAGNOSIS — I429 Cardiomyopathy, unspecified: Secondary | ICD-10-CM | POA: Diagnosis not present

## 2017-10-21 DIAGNOSIS — T82868A Thrombosis of vascular prosthetic devices, implants and grafts, initial encounter: Secondary | ICD-10-CM | POA: Insufficient documentation

## 2017-10-21 DIAGNOSIS — Y832 Surgical operation with anastomosis, bypass or graft as the cause of abnormal reaction of the patient, or of later complication, without mention of misadventure at the time of the procedure: Secondary | ICD-10-CM | POA: Diagnosis not present

## 2017-10-21 DIAGNOSIS — Z8673 Personal history of transient ischemic attack (TIA), and cerebral infarction without residual deficits: Secondary | ICD-10-CM | POA: Insufficient documentation

## 2017-10-21 HISTORY — PX: IR THROMBECTOMY AV FISTULA W/THROMBOLYSIS/PTA INC/SHUNT/IMG RIGHT: IMG6119

## 2017-10-21 HISTORY — PX: IR US GUIDE VASC ACCESS RIGHT: IMG2390

## 2017-10-21 HISTORY — PX: IR PTA ADDL CENTRAL DIALYSIS SEG THRU DIALY CIRCUIT RIGHT: IMG6121

## 2017-10-21 LAB — CBC
HCT: 50.5 % (ref 39.0–52.0)
Hemoglobin: 15.8 g/dL (ref 13.0–17.0)
MCH: 21.4 pg — ABNORMAL LOW (ref 26.0–34.0)
MCHC: 31.3 g/dL (ref 30.0–36.0)
MCV: 68.3 fL — ABNORMAL LOW (ref 78.0–100.0)
PLATELETS: 316 10*3/uL (ref 150–400)
RBC: 7.39 MIL/uL — ABNORMAL HIGH (ref 4.22–5.81)
RDW: 19.6 % — AB (ref 11.5–15.5)
WBC: 7.7 10*3/uL (ref 4.0–10.5)

## 2017-10-21 LAB — BASIC METABOLIC PANEL
ANION GAP: 18 — AB (ref 5–15)
BUN: 40 mg/dL — AB (ref 8–23)
CO2: 21 mmol/L — ABNORMAL LOW (ref 22–32)
Calcium: 8.5 mg/dL — ABNORMAL LOW (ref 8.9–10.3)
Chloride: 99 mmol/L (ref 98–111)
Creatinine, Ser: 14.56 mg/dL — ABNORMAL HIGH (ref 0.61–1.24)
GFR calc Af Amer: 3 mL/min — ABNORMAL LOW (ref >60)
GFR, EST NON AFRICAN AMERICAN: 3 mL/min — AB (ref >60)
GLUCOSE: 90 mg/dL (ref 70–99)
POTASSIUM: 4.9 mmol/L (ref 3.5–5.1)
SODIUM: 138 mmol/L (ref 135–145)

## 2017-10-21 LAB — PROTIME-INR
INR: 1.04
Prothrombin Time: 13.5 seconds (ref 11.4–15.2)

## 2017-10-21 MED ORDER — ALTEPLASE 2 MG IJ SOLR
INTRAMUSCULAR | Status: AC
  Administered 2017-10-21: 4 mg
  Filled 2017-10-21: qty 4

## 2017-10-21 MED ORDER — MIDAZOLAM HCL 2 MG/2ML IJ SOLN
INTRAMUSCULAR | Status: AC | PRN
  Administered 2017-10-21: 0.5 mg via INTRAVENOUS
  Administered 2017-10-21: 1 mg via INTRAVENOUS
  Administered 2017-10-21: 0.5 mg via INTRAVENOUS

## 2017-10-21 MED ORDER — FENTANYL CITRATE (PF) 100 MCG/2ML IJ SOLN
INTRAMUSCULAR | Status: AC
  Filled 2017-10-21: qty 2

## 2017-10-21 MED ORDER — FENTANYL CITRATE (PF) 100 MCG/2ML IJ SOLN
INTRAMUSCULAR | Status: AC | PRN
  Administered 2017-10-21 (×2): 25 ug via INTRAVENOUS
  Administered 2017-10-21 (×2): 12.5 ug via INTRAVENOUS
  Administered 2017-10-21: 25 ug via INTRAVENOUS

## 2017-10-21 MED ORDER — MIDAZOLAM HCL 2 MG/2ML IJ SOLN
INTRAMUSCULAR | Status: AC
  Filled 2017-10-21: qty 2

## 2017-10-21 MED ORDER — LIDOCAINE HCL 1 % IJ SOLN
INTRAMUSCULAR | Status: AC
  Filled 2017-10-21: qty 20

## 2017-10-21 MED ORDER — IOPAMIDOL (ISOVUE-300) INJECTION 61%
INTRAVENOUS | Status: AC
  Administered 2017-10-21: 80 mL
  Filled 2017-10-21: qty 100

## 2017-10-21 MED ORDER — SODIUM CHLORIDE 0.9 % IV SOLN: INTRAVENOUS | Status: DC

## 2017-10-21 MED ORDER — HEPARIN SODIUM (PORCINE) 1000 UNIT/ML IJ SOLN
INTRAMUSCULAR | Status: AC | PRN
  Administered 2017-10-21: 4000 [IU] via INTRAVENOUS

## 2017-10-21 MED ORDER — LIDOCAINE HCL (PF) 1 % IJ SOLN
INTRAMUSCULAR | Status: AC | PRN
  Administered 2017-10-21: 5 mL

## 2017-10-21 MED ORDER — HEPARIN SODIUM (PORCINE) 1000 UNIT/ML IJ SOLN
INTRAMUSCULAR | Status: AC
  Filled 2017-10-21: qty 1

## 2017-10-21 NOTE — Sedation Documentation (Signed)
Patient c/o lower back pain. MD aware.

## 2017-10-21 NOTE — H&P (Signed)
Chief Complaint: Patient was seen in consultation today for renal failure  Referring Physician(s): Fran Lowes  Supervising Physician: Sandi Mariscal  Patient Status: [name] - Out-pt  History of Present Illness: [name]. is a 77 y.o. 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   He presents today in his usual state of health. His RUA graft has no palpable thrill. He has been NPO.  He does not take blood thinners.   Past Medical History:  Diagnosis Date  . Anxiety   . Aortic stenosis   . Arthritis   . CVA (cerebral infarction)   . ESRD (end stage renal disease) on dialysis [facility])    M/W/F at [facility] in [place]  . Essential hypertension   . Gangrene of right foot ([place])   . Gastric ulcer 2004  . History of cardiomyopathy    LVEF normal as of February 2017  . History of stroke    Left side weakness  . Iron deficiency anemia     Past Surgical History:  Procedure Laterality Date  . ABDOMINAL AORTAGRAM N/A 01/24/2012   Procedure: ABDOMINAL Maxcine Ham;  Surgeon: Elam Dutch, MD;  Location: [facility] CATH LAB;  Service: Cardiovascular;  Laterality: N/A;  . ABDOMINAL AORTOGRAM W/LOWER EXTREMITY N/A 04/15/2017   Procedure: ABDOMINAL AORTOGRAM W/LOWER EXTREMITY;  Surgeon: Angelia Mould, MD;  Location: [place] CV LAB;  Service: Cardiovascular;  Laterality: N/A;  . AMPUTATION Right 05/09/2017   Procedure: RIGHT TRANSMETATARSAL AMPUTATION;  Surgeon: [name], MD;  Location: [place];  Service: Orthopedics;  Laterality: Right;  . ARTERIOVENOUS GRAFT PLACEMENT Right right arm  . AV FISTULA PLACEMENT Left 08/31/2015   Procedure: ARTERIOVENOUS (AV) FISTULA CREATION- LEFT RADIOCEPHALIC;  Surgeon: Mal Misty, MD;  Location: [place];  Service: Vascular;  Laterality: Left;  . AV FISTULA PLACEMENT Right 02/18/2017   Procedure: INSERTION OF ARTERIOVENOUS (AV) GORE-TEX GRAFT  RIGHT UPPER ARM;  Surgeon: Conrad Flaxville, MD;  Location: [place];  Service: Vascular;  Laterality: Right;  . BASCILIC VEIN TRANSPOSITION Left 12/19/2015   Procedure: FIRST STAGE BRACHIAL VEIN TRANSPOSITION;  Surgeon: Conrad Metcalfe, MD;  Location: [place];  Service: Vascular;  Laterality: Left;  . BASCILIC VEIN TRANSPOSITION Left 02/08/2016   Procedure: SECOND STAGE BRACHIAL VEIN TRANSPOSITION;  Surgeon: Conrad Luther, MD;  Location: [place];  Service: Vascular;  Laterality: Left;  . CARDIAC CATHETERIZATION N/A 07/11/2015   Procedure: Left Heart Cath and Coronary Angiography;  Surgeon: Troy Sine, MD;  Location: [place] CV LAB;  Service: Cardiovascular;  Laterality: N/A;  . Carpel Tunnel Left Dec. 22, 2016  . CHOLECYSTECTOMY    . COLONOSCOPY  2004   Dr. Irving Shows, left sided diverticula and cecal polyp, path unknown  . COLONOSCOPY  10/29/2011   Procedure: COLONOSCOPY;  Surgeon: Daneil Dolin, MD;  [facility];  Service: Endoscopy;  Laterality: N/A;  10:15  . ESOPHAGOGASTRODUODENOSCOPY  11/2002   Dr. Gala Romney, erosive reflux esophagitis, multiple gastric ulcer and antral/bulbar erosions. Serologies positive for H.Pylori and was treated  . ESOPHAGOGASTRODUODENOSCOPY  11/20014   Dr. Gala Romney, small hh only, ulcers healed  . ESOPHAGOGASTRODUODENOSCOPY  09/21/2011   Dr Trevor Iha HH, antral erosions, ?early GAVE  . FISTULOGRAM Left 12/10/2016   Procedure: THROMBECTOMY OF LEFT ARM ARTERIOVENOUS FISTULA;  Surgeon: Waynetta Sandy, MD;  Location: [place];  Service: Vascular;  Laterality: Left;  . INSERTION OF DIALYSIS CATHETER Left 12/10/2016   Procedure: INSERTION OF TUNNELED DIALYSIS CATHETER;  Surgeon: Waynetta Sandy, MD;  Location: [place];  Service: Vascular;  Laterality: Left;  . IR GENERIC HISTORICAL  07/16/2016   IR REMOVAL TUN CV CATH W/O FL 07/16/2016 Saverio Danker, PA-C [facility]-INTERV RAD  . IR REMOVAL TUN CV CATH W/O  FL  05/12/2017  . LIGATION OF ARTERIOVENOUS  FISTULA Left 12/19/2015   Procedure: LIGATION OF RADIOCEPHALIC ARTERIOVENOUS  FISTULA;  Surgeon: Conrad [place] Point, MD;  Location: [place];  Service: Vascular;  Laterality: Left;  Marland Kitchen MASS EXCISION Right 02/18/2017   Procedure: EXCISION OF RIGHT AXILLARY EPIDERMAL INCLUSION CYST;  Surgeon: Conrad Rome, MD;  Location: [place];  Service: Vascular;  Laterality: Right;  . PERIPHERAL VASCULAR CATHETERIZATION N/A 12/14/2015   Procedure: Fistulagram;  Surgeon: Conrad [place], MD;  Location: [place] CV LAB;  Service: Cardiovascular;  Laterality: N/A;  . SHOULDER SURGERY    . ULTRASOUND GUIDANCE FOR VASCULAR ACCESS  04/15/2017   Procedure: Ultrasound Guidance For Vascular Access;  Surgeon: Angelia Mould, MD;  Location: [place] CV LAB;  Service: Cardiovascular;;  . UPPER EXTREMITY VENOGRAPHY Bilateral 12/17/2016   Procedure: Bilateral Upper Extremity Venography;  Surgeon: Serafina Mitchell, MD;  Location: [place] CV LAB;  Service: Cardiovascular;  Laterality: Bilateral;    Allergies: Aspirin  Medications: Prior to Admission medications   Medication Sig Start Date End Date Taking? Authorizing Provider  ALPRAZolam Duanne Moron) 0.5 MG tablet Take 0.5 mg by mouth daily. 10/06/15  Yes [provider]  atorvastatin (LIPITOR) 40 MG tablet Take 40 mg by mouth daily. 11/03/15  Yes [provider]  b complex-vitamin c-folic acid (NEPHRO-VITE) 0.8 MG TABS Take 0.8 mg by mouth See admin instructions. Takes on Tuesdays, Thursdays, Saturdays, and Sundays. Does not take on Mondays, Wednesdays, and Fridays due to Dialysis treatments.   Yes [provider]  cinacalcet (SENSIPAR) 60 MG tablet Take 60 mg by mouth every evening. With evening meal   Yes [provider]  meclizine (ANTIVERT) 25 MG tablet Take 25 mg by mouth daily as needed for dizziness (Non dialysis days).    Yes [provider]  mupirocin ointment (BACTROBAN) 2 % Apply  1 application topically daily. 07/01/17  Yes [name], MD  nitroGLYCERIN (NITRODUR - DOSED IN MG/24 HR) 0.2 mg/hr patch Place 1 patch (0.2 mg total) onto the skin daily. Apply 1 patch to dorsum of foot daily. 06/12/17  Yes Suzan Slick, NP  ondansetron (ZOFRAN) 8 MG tablet Take 1 tablet (8 mg total) by mouth every 8 (eight) hours as needed for nausea or vomiting. 03/24/17  Yes Daleen Bo, MD  promethazine (PHENERGAN) 25 MG tablet Take 25 mg by mouth every 8 (eight) hours as needed for nausea or vomiting.  08/03/10  Yes [provider]  sevelamer (RENVELA) 800 MG tablet Take 1,600-2,400 mg by mouth See admin instructions. Take 2400  mg by mouth 3 times daily with full meals and take 1600 mg by mouth with snacks.   Yes [provider]  silver sulfADIAZINE (SILVADENE) 1 % cream Apply 1 application topically daily. 09/17/17  Yes Suzan Slick, NP  HYDROcodone-acetaminophen (NORCO/VICODIN) 5-325 MG tablet Take 1 tablet by mouth every 4 (four) hours as needed for moderate pain. Patient not taking: Reported on 10/21/2017 05/09/17   [name], MD  oxyCODONE-acetaminophen (PERCOCET/ROXICET) 5-325 MG tablet Take 1 tablet by mouth  every 6 (six) hours as needed for severe pain. Patient not taking: Reported on 10/21/2017 05/13/17   [name], MD     Family History  Problem Relation Age of Onset  . Hypertension Mother   . Colon cancer Neg Hx   . Liver disease Neg Hx     Social History   Socioeconomic History  . Marital status: Single    Spouse name: Not on file  . Number of children: 2  . Years of education: Not on file  . Highest education level: Not on file  Occupational History  . Occupation: retired, Licensed conveyancer    Employer: RETIRED  Social Needs  . Financial resource strain: Not on file  . Food insecurity:    Worry: Not on file    Inability: Not on file  . Transportation needs:    Medical: Not on file    Non-medical: Not on file  Tobacco Use  . Smoking  status: Former Smoker    Last attempt to quit: 04/29/2004    Years since quitting: 13.4  . Smokeless tobacco: Former Systems developer    Types: Chew    Quit date: 01/16/1987  . Tobacco comment: quit 2006  Substance and Sexual Activity  . Alcohol use: No  . Drug use: No  . Sexual activity: Not on file  Lifestyle  . Physical activity:    Days per week: Not on file    Minutes per session: Not on file  . Stress: Not on file  Relationships  . Social connections:    Talks on phone: Not on file    Gets together: Not on file    Attends religious service: Not on file    Active member of club or organization: Not on file    Attends meetings of clubs or organizations: Not on file    Relationship status: Not on file  Other Topics Concern  . Not on file  Social History Narrative   Lives alone   Daughter 20-min away   Caffeine use: 32oz soda per day     Review of Systems: A 12 point ROS discussed and pertinent positives are indicated in the HPI above.  All other systems are negative.  Review of Systems  Constitutional: Negative for fatigue and fever.  Respiratory: Negative for cough and shortness of breath.   Cardiovascular: Negative for chest pain.  Gastrointestinal: Negative for abdominal pain.  Musculoskeletal: Negative for back pain.  Psychiatric/Behavioral: Negative for behavioral problems and confusion.    Vital Signs: BP (!) 165/88   Pulse 76   Temp (!) 97.5 F (36.4 C) (Oral)   Resp 18   Ht 5\' 5"  (1.651 m)   Wt 170 lb (77.1 kg)   SpO2 100%   BMI 28.29 kg/m   Physical Exam  Constitutional: He is oriented to person, place, and time. He appears well-developed.  Neck: Normal range of motion. Neck supple.  Cardiovascular: Normal rate, regular rhythm and normal heart sounds.  Pulmonary/Chest: Effort normal and breath sounds normal. No respiratory distress. He has no wheezes.  Abdominal: Soft. Bowel sounds are normal.  Lymphadenopathy:    He has no cervical adenopathy.    Neurological: He is alert and oriented to person, place, and time.  Skin: Skin is warm and dry.  Psychiatric: He has a normal mood and affect. His behavior is normal. Judgment and thought content normal.  Nursing note and vitals reviewed.    MD Evaluation Airway: WNL Heart: WNL Abdomen: WNL Chest/ Lungs: WNL  ASA  Classification: 3 Mallampati/Airway Score: One   Imaging: No results found.  Labs:  CBC: Recent Labs    05/09/17 0925 05/10/17 0803 05/12/17 0810 10/21/17 0724  WBC 8.3 10.1 10.8* 7.7  HGB 11.3* 11.3* 11.5* 15.8  HCT 36.0* 33.9* 34.7* 50.5  PLT 220 287 195 316    COAGS: Recent Labs    10/21/17 0724  INR 1.04    BMP: Recent Labs    05/09/17 0925 05/10/17 0803 05/12/17 0809 10/21/17 0724  NA 137 136 137 138  K 4.1 3.5 3.3* 4.9  CL 98* 97* 97* 99  CO2 26 23 25  21*  GLUCOSE 81 83 127* 90  BUN 25* 36* 36* 40*  CALCIUM 8.9 8.7* 9.2 8.5*  CREATININE 9.13* 10.70* 10.30* 14.56*  GFRNONAA 5* 4* 4* 3*  GFRAA 6* 5* 5* 3*    LIVER FUNCTION TESTS: Recent Labs    03/24/17 1459 03/31/17 0206 05/10/17 0803 05/12/17 0809  BILITOT 0.5 0.5  --   --   AST 30 24  --   --   ALT 20 20  --   --   ALKPHOS 98 88  --   --   PROT 8.5* 7.2  --   --   ALBUMIN 3.7 3.3* 3.0* 2.9*    TUMOR MARKERS: No results for input(s): AFPTM, CEA, CA199, CHROMGRNA in the last 8760 hours.  Assessment and Plan: Patient with past medical history of renal failure on HD via RUA graft presents with complaint of clotted graft.  IR consulted for declotting procedure at the request of Dr. Lowanda Foster. Patient presents today in their usual state of health.  He has been NPO and is not currently on blood thinners.  He understands that if his graft cannot be declotted, a tunneled catheter will be placed for access. He is agreeable.   Risks and benefits discussed with the patient including, but not limited to bleeding, infection, vascular injury, pneumothorax which may require chest  tube placement, air embolism or even death  All of the patient's questions were answered, patient is agreeable to proceed. Consent signed and in chart.  Thank you for this interesting consult.  I greatly enjoyed meeting VELTON ROSELLE Sr. and look forward to participating in their care.  A copy of this report was sent to the requesting provider on this date.  Electronically Signed: Docia Barrier, PA 10/21/2017, 9:49 AM   I spent a total of  30 Minutes   in face to face in clinical consultation, greater than 50% of which was counseling/coordinating care for renal failure.

## 2017-10-21 NOTE — Procedures (Signed)
Pre procedural Dx: ESRD Post procedural Dx: Same.   Technically successful declot of right upper arm dialysis graft.   Access is ready for immediate use.  EBL: Trace  Complications: None immediate   Ronny Bacon, MD Pager #: [phone]

## 2017-10-22 DIAGNOSIS — N186 End stage renal disease: Secondary | ICD-10-CM | POA: Diagnosis not present

## 2017-10-22 DIAGNOSIS — Z992 Dependence on renal dialysis: Secondary | ICD-10-CM | POA: Diagnosis not present

## 2017-10-22 DIAGNOSIS — N2581 Secondary hyperparathyroidism of renal origin: Secondary | ICD-10-CM | POA: Diagnosis not present

## 2017-10-24 DIAGNOSIS — N186 End stage renal disease: Secondary | ICD-10-CM | POA: Diagnosis not present

## 2017-10-24 DIAGNOSIS — N2581 Secondary hyperparathyroidism of renal origin: Secondary | ICD-10-CM | POA: Diagnosis not present

## 2017-10-24 DIAGNOSIS — Z992 Dependence on renal dialysis: Secondary | ICD-10-CM | POA: Diagnosis not present

## 2017-10-26 DIAGNOSIS — Z992 Dependence on renal dialysis: Secondary | ICD-10-CM | POA: Diagnosis not present

## 2017-10-26 DIAGNOSIS — N186 End stage renal disease: Secondary | ICD-10-CM | POA: Diagnosis not present

## 2017-10-27 DIAGNOSIS — N2581 Secondary hyperparathyroidism of renal origin: Secondary | ICD-10-CM | POA: Diagnosis not present

## 2017-10-27 DIAGNOSIS — D509 Iron deficiency anemia, unspecified: Secondary | ICD-10-CM | POA: Diagnosis not present

## 2017-10-27 DIAGNOSIS — Z992 Dependence on renal dialysis: Secondary | ICD-10-CM | POA: Diagnosis not present

## 2017-10-27 DIAGNOSIS — N186 End stage renal disease: Secondary | ICD-10-CM | POA: Diagnosis not present

## 2017-10-28 DIAGNOSIS — E1142 Type 2 diabetes mellitus with diabetic polyneuropathy: Secondary | ICD-10-CM | POA: Diagnosis not present

## 2017-10-28 DIAGNOSIS — L851 Acquired keratosis [keratoderma] palmaris et plantaris: Secondary | ICD-10-CM | POA: Diagnosis not present

## 2017-10-28 DIAGNOSIS — B351 Tinea unguium: Secondary | ICD-10-CM | POA: Diagnosis not present

## 2017-10-29 DIAGNOSIS — Z992 Dependence on renal dialysis: Secondary | ICD-10-CM | POA: Diagnosis not present

## 2017-10-29 DIAGNOSIS — N186 End stage renal disease: Secondary | ICD-10-CM | POA: Diagnosis not present

## 2017-10-29 DIAGNOSIS — D509 Iron deficiency anemia, unspecified: Secondary | ICD-10-CM | POA: Diagnosis not present

## 2017-10-29 DIAGNOSIS — N2581 Secondary hyperparathyroidism of renal origin: Secondary | ICD-10-CM | POA: Diagnosis not present

## 2017-10-31 DIAGNOSIS — N2581 Secondary hyperparathyroidism of renal origin: Secondary | ICD-10-CM | POA: Diagnosis not present

## 2017-10-31 DIAGNOSIS — N186 End stage renal disease: Secondary | ICD-10-CM | POA: Diagnosis not present

## 2017-10-31 DIAGNOSIS — Z992 Dependence on renal dialysis: Secondary | ICD-10-CM | POA: Diagnosis not present

## 2017-10-31 DIAGNOSIS — D509 Iron deficiency anemia, unspecified: Secondary | ICD-10-CM | POA: Diagnosis not present

## 2017-11-03 DIAGNOSIS — D509 Iron deficiency anemia, unspecified: Secondary | ICD-10-CM | POA: Diagnosis not present

## 2017-11-03 DIAGNOSIS — N186 End stage renal disease: Secondary | ICD-10-CM | POA: Diagnosis not present

## 2017-11-03 DIAGNOSIS — Z992 Dependence on renal dialysis: Secondary | ICD-10-CM | POA: Diagnosis not present

## 2017-11-03 DIAGNOSIS — N2581 Secondary hyperparathyroidism of renal origin: Secondary | ICD-10-CM | POA: Diagnosis not present

## 2017-11-05 DIAGNOSIS — D509 Iron deficiency anemia, unspecified: Secondary | ICD-10-CM | POA: Diagnosis not present

## 2017-11-05 DIAGNOSIS — Z992 Dependence on renal dialysis: Secondary | ICD-10-CM | POA: Diagnosis not present

## 2017-11-05 DIAGNOSIS — N186 End stage renal disease: Secondary | ICD-10-CM | POA: Diagnosis not present

## 2017-11-05 DIAGNOSIS — N2581 Secondary hyperparathyroidism of renal origin: Secondary | ICD-10-CM | POA: Diagnosis not present

## 2017-11-06 ENCOUNTER — Encounter (INDEPENDENT_AMBULATORY_CARE_PROVIDER_SITE_OTHER): Payer: Self-pay | Admitting: Orthopedic Surgery

## 2017-11-06 ENCOUNTER — Ambulatory Visit (INDEPENDENT_AMBULATORY_CARE_PROVIDER_SITE_OTHER): Payer: Medicare Other | Admitting: Orthopedic Surgery

## 2017-11-06 VITALS — Ht 65.0 in | Wt 170.0 lb

## 2017-11-06 DIAGNOSIS — L97411 Non-pressure chronic ulcer of right heel and midfoot limited to breakdown of skin: Secondary | ICD-10-CM | POA: Diagnosis not present

## 2017-11-06 DIAGNOSIS — Z89431 Acquired absence of right foot: Secondary | ICD-10-CM

## 2017-11-06 NOTE — Progress Notes (Signed)
Office Visit Note   Patient: Joseph CAHOON Sr.           Date of Birth: 24-Mar-1941           MRN: [id number] Visit Date: 11/06/2017              Requested by: [name], MD [address] [place], [place] [address] PCP: [name], MD  Chief Complaint  Patient presents with  . Right Foot - Follow-up      HPI: Patient is a 77 year old gentleman who is 6 months status post right transmetatarsal amputation he is ambulating in regular shoewear he states he does have a prescription for new shoe wear.  He uses a cane.  He has no complaints he states he changes the dressing once a day.  Assessment & Plan: Visit Diagnoses:  1. S/P transmetatarsal amputation of foot, right ([place])   2. Midfoot skin ulcer, right, limited to breakdown of skin ([place])     Plan: Ulcer was debrided of skin and soft tissue.  No signs of infection.  Patient will continue with dressing changes minimize weightbearing he will obtain his new shoes and orthotics to hopefully unload some pressure from the end of the residual limb.  Follow-Up Instructions: Return in about 1 month (around 12/04/2017).   Ortho Exam  Patient is alert, oriented, no adenopathy, well-dressed, normal affect, normal respiratory effort. Examination patient's foot is plantigrade no equinus contracture.  He has a large ulcer with hypertrophic callus.  After informed consent a 10 blade knife was used to debride the skin and soft tissue back to healthy viable bleeding granulation tissue.  The total dimensions 10 x 3 cm and 5 mm deep.  There is no exposed bone or tendon no drainage.  There is no ascending cellulitis.  Imaging: No results found. No images are attached to the encounter.  Labs: Lab Results  Component Value Date   REPTSTATUS 02/22/2017 FINAL 02/18/2017   GRAMSTAIN  02/18/2017    RARE WBC PRESENT, PREDOMINANTLY PMN FEW GRAM POSITIVE COCCI    CULT  02/18/2017    NO GROWTH AEROBICALLY MIXED ANAEROBIC FLORA PRESENT.   CALL LAB IF FURTHER IID REQUIRED.      Lab Results  Component Value Date   ALBUMIN 2.9 (L) 05/12/2017   ALBUMIN 3.0 (L) 05/10/2017   ALBUMIN 3.3 (L) 03/31/2017    Body mass index is 28.29 kg/m.  Orders:  No orders of the defined types were placed in this encounter.  No orders of the defined types were placed in this encounter.    Procedures: No procedures performed  Clinical Data: No additional findings.  ROS:  All other systems negative, except as noted in the HPI. Review of Systems  Objective: Vital Signs: Ht 5\' 5"  (1.651 m)   Wt 170 lb (77.1 kg)   BMI 28.29 kg/m   Specialty Comments:  No specialty comments available.  PMFS History: Patient Active Problem List   Diagnosis Date Noted  . S/P transmetatarsal amputation of foot, right ([place]) 05/09/2017  . PVD (peripheral vascular disease) ([place]) 04/15/2017  . Nonischemic cardiomyopathy ([place]) 08/15/2015  . Aortic stenosis 08/15/2015  . Moderate aortic stenosis 07/12/2015  . 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 CAD- 40-50% LAD at cath 07/11/15 07/11/2015  . Aspirin intolerance 07/11/2015  . Abnormal stress test   . ESRD on dialysis ([place]) 05/30/2015  . CTS (carpal tunnel syndrome) 02/28/2015  . PVD of LE - Dr Oneida Alar follows 08/20/2012  .  Midfoot skin ulcer, right, limited to breakdown of skin ([place]) 01/16/2012  . Encounter for screening colonoscopy 10/08/2011  . Melena 10/08/2011  . Other complications due to renal dialysis device, implant, and graft 09/24/2011   Past Medical History:  Diagnosis Date  . Anxiety   . Aortic stenosis   . Arthritis   . CVA (cerebral infarction)   . ESRD (end stage renal disease) on dialysis [facility])    M/W/F at [facility] in [place]  . Essential hypertension   . Gangrene of right foot ([place])   . Gastric ulcer 2004  . History of cardiomyopathy    LVEF normal as of February 2017  . History of stroke    Left side weakness  . Iron deficiency anemia     Family  History  Problem Relation Age of Onset  . Hypertension Mother   . Colon cancer Neg Hx   . Liver disease Neg Hx     Past Surgical History:  Procedure Laterality Date  . ABDOMINAL AORTAGRAM N/A 01/24/2012   Procedure: ABDOMINAL Maxcine Ham;  Surgeon: Elam Dutch, MD;  Location: [facility] CATH LAB;  Service: Cardiovascular;  Laterality: N/A;  . ABDOMINAL AORTOGRAM W/LOWER EXTREMITY N/A 04/15/2017   Procedure: ABDOMINAL AORTOGRAM W/LOWER EXTREMITY;  Surgeon: Angelia Mould, MD;  Location: [place] CV LAB;  Service: Cardiovascular;  Laterality: N/A;  . AMPUTATION Right 05/09/2017   Procedure: RIGHT TRANSMETATARSAL AMPUTATION;  Surgeon: [name], MD;  Location: [place];  Service: Orthopedics;  Laterality: Right;  . ARTERIOVENOUS GRAFT PLACEMENT Right right arm  . AV FISTULA PLACEMENT Left 08/31/2015   Procedure: ARTERIOVENOUS (AV) FISTULA CREATION- LEFT RADIOCEPHALIC;  Surgeon: Mal Misty, MD;  Location: [place];  Service: Vascular;  Laterality: Left;  . AV FISTULA PLACEMENT Right 02/18/2017   Procedure: INSERTION OF ARTERIOVENOUS (AV) GORE-TEX GRAFT  RIGHT UPPER ARM;  Surgeon: Conrad Norco, MD;  Location: [place];  Service: Vascular;  Laterality: Right;  . BASCILIC VEIN TRANSPOSITION Left 12/19/2015   Procedure: FIRST STAGE BRACHIAL VEIN TRANSPOSITION;  Surgeon: Conrad Woodlawn, MD;  Location: [place];  Service: Vascular;  Laterality: Left;  . BASCILIC VEIN TRANSPOSITION Left 02/08/2016   Procedure: SECOND STAGE BRACHIAL VEIN TRANSPOSITION;  Surgeon: Conrad Holloway, MD;  Location: [place];  Service: Vascular;  Laterality: Left;  . CARDIAC CATHETERIZATION N/A 07/11/2015   Procedure: Left Heart Cath and Coronary Angiography;  Surgeon: Troy Sine, MD;  Location: [employer] CV LAB;  Service: Cardiovascular;  Laterality: N/A;  . Carpel Tunnel Left Dec. 22, 2016  . CHOLECYSTECTOMY    . COLONOSCOPY  2004   Dr. Irving Shows, left sided diverticula and cecal polyp, path unknown  . COLONOSCOPY   10/29/2011   Procedure: COLONOSCOPY;  Surgeon: Daneil Dolin, MD;  [facility];  Service: Endoscopy;  Laterality: N/A;  10:15  . ESOPHAGOGASTRODUODENOSCOPY  11/2002   Dr. Gala Romney, erosive reflux esophagitis, multiple gastric ulcer and antral/bulbar erosions. Serologies positive for H.Pylori and was treated  . ESOPHAGOGASTRODUODENOSCOPY  11/20014   Dr. Gala Romney, small hh only, ulcers healed  . ESOPHAGOGASTRODUODENOSCOPY  09/21/2011   Dr Trevor Iha HH, antral erosions, ?early GAVE  . FISTULOGRAM Left 12/10/2016   Procedure: THROMBECTOMY OF LEFT ARM ARTERIOVENOUS FISTULA;  Surgeon: Waynetta Sandy, MD;  Location: [place];  Service: Vascular;  Laterality: Left;  . INSERTION OF DIALYSIS CATHETER Left 12/10/2016   Procedure: INSERTION OF TUNNELED DIALYSIS CATHETER;  Surgeon: Waynetta Sandy, MD;  Location: [place];  Service:  Vascular;  Laterality: Left;  . IR GENERIC HISTORICAL  07/16/2016   IR REMOVAL TUN CV CATH W/O FL 07/16/2016 Saverio Danker, PA-C [facility]INTER[facility]  . IR PTA ADDL CENTRAL DIALYSIS SEG THRU DIALY CIRCUIT RIGHT Right 10/21/2017  . IR REMOVAL TUN CV CATH W/O FL  05/12/2017  . IR THROMBECTOMY AV FISTULA W/THROMBOLYSIS/PTA INC/SHUNT/IMG RIGHT Right 10/21/2017  . IR US GUIDE VASC ACCESS RIGHT  10/21/2017  . LIGATION OF ARTERIOVENOUS  FISTULA Left 12/19/2015   Procedure: LIGATION OF RADIOCEPHALIC ARTERIOVENOUS  FISTULA;  Surgeon: Conrad Swanville, MD;  Location: [place];  Service: Vascular;  Laterality: Left;  Marland Kitchen MASS EXCISION Right 02/18/2017   Procedure: EXCISION OF RIGHT AXILLARY EPIDERMAL INCLUSION CYST;  Surgeon: Conrad Bergen, MD;  Location: [place];  Service: Vascular;  Laterality: Right;  . PERIPHERAL VASCULAR CATHETERIZATION N/A 12/14/2015   Procedure: Fistulagram;  Surgeon: Conrad Fortescue, MD;  Location: [place] CV LAB;  Service: Cardiovascular;  Laterality: N/A;  . SHOULDER SURGERY    . ULTRASOUND GUIDANCE FOR VASCULAR ACCESS  04/15/2017   Procedure: Ultrasound Guidance  For Vascular Access;  Surgeon: Angelia Mould, MD;  Location: [place] CV LAB;  Service: Cardiovascular;;  . UPPER EXTREMITY VENOGRAPHY Bilateral 12/17/2016   Procedure: Bilateral Upper Extremity Venography;  Surgeon: Serafina Mitchell, MD;  Location: [place] CV LAB;  Service: Cardiovascular;  Laterality: Bilateral;   Social History   Occupational History  . Occupation: retired, Licensed conveyancer    Employer: RETIRED  Tobacco Use  . Smoking status: Former Smoker    Last attempt to quit: 04/29/2004    Years since quitting: 13.5  . Smokeless tobacco: Former Systems developer    Types: Chew    Quit date: 01/16/1987  . Tobacco comment: quit 2006  Substance and Sexual Activity  . Alcohol use: No  . Drug use: No  . Sexual activity: Not on file

## 2017-11-07 DIAGNOSIS — N186 End stage renal disease: Secondary | ICD-10-CM | POA: Diagnosis not present

## 2017-11-07 DIAGNOSIS — N2581 Secondary hyperparathyroidism of renal origin: Secondary | ICD-10-CM | POA: Diagnosis not present

## 2017-11-07 DIAGNOSIS — D509 Iron deficiency anemia, unspecified: Secondary | ICD-10-CM | POA: Diagnosis not present

## 2017-11-07 DIAGNOSIS — Z992 Dependence on renal dialysis: Secondary | ICD-10-CM | POA: Diagnosis not present

## 2017-11-10 DIAGNOSIS — Z992 Dependence on renal dialysis: Secondary | ICD-10-CM | POA: Diagnosis not present

## 2017-11-10 DIAGNOSIS — N186 End stage renal disease: Secondary | ICD-10-CM | POA: Diagnosis not present

## 2017-11-10 DIAGNOSIS — D509 Iron deficiency anemia, unspecified: Secondary | ICD-10-CM | POA: Diagnosis not present

## 2017-11-10 DIAGNOSIS — N2581 Secondary hyperparathyroidism of renal origin: Secondary | ICD-10-CM | POA: Diagnosis not present

## 2017-11-12 DIAGNOSIS — D509 Iron deficiency anemia, unspecified: Secondary | ICD-10-CM | POA: Diagnosis not present

## 2017-11-12 DIAGNOSIS — N2581 Secondary hyperparathyroidism of renal origin: Secondary | ICD-10-CM | POA: Diagnosis not present

## 2017-11-12 DIAGNOSIS — N186 End stage renal disease: Secondary | ICD-10-CM | POA: Diagnosis not present

## 2017-11-12 DIAGNOSIS — Z992 Dependence on renal dialysis: Secondary | ICD-10-CM | POA: Diagnosis not present

## 2017-11-14 DIAGNOSIS — N2581 Secondary hyperparathyroidism of renal origin: Secondary | ICD-10-CM | POA: Diagnosis not present

## 2017-11-14 DIAGNOSIS — Z992 Dependence on renal dialysis: Secondary | ICD-10-CM | POA: Diagnosis not present

## 2017-11-14 DIAGNOSIS — N186 End stage renal disease: Secondary | ICD-10-CM | POA: Diagnosis not present

## 2017-11-14 DIAGNOSIS — D509 Iron deficiency anemia, unspecified: Secondary | ICD-10-CM | POA: Diagnosis not present

## 2017-11-17 DIAGNOSIS — N2581 Secondary hyperparathyroidism of renal origin: Secondary | ICD-10-CM | POA: Diagnosis not present

## 2017-11-17 DIAGNOSIS — N186 End stage renal disease: Secondary | ICD-10-CM | POA: Diagnosis not present

## 2017-11-17 DIAGNOSIS — D509 Iron deficiency anemia, unspecified: Secondary | ICD-10-CM | POA: Diagnosis not present

## 2017-11-17 DIAGNOSIS — Z992 Dependence on renal dialysis: Secondary | ICD-10-CM | POA: Diagnosis not present

## 2017-11-19 DIAGNOSIS — N2581 Secondary hyperparathyroidism of renal origin: Secondary | ICD-10-CM | POA: Diagnosis not present

## 2017-11-19 DIAGNOSIS — N186 End stage renal disease: Secondary | ICD-10-CM | POA: Diagnosis not present

## 2017-11-19 DIAGNOSIS — Z992 Dependence on renal dialysis: Secondary | ICD-10-CM | POA: Diagnosis not present

## 2017-11-19 DIAGNOSIS — D509 Iron deficiency anemia, unspecified: Secondary | ICD-10-CM | POA: Diagnosis not present

## 2017-11-21 DIAGNOSIS — N2581 Secondary hyperparathyroidism of renal origin: Secondary | ICD-10-CM | POA: Diagnosis not present

## 2017-11-21 DIAGNOSIS — D509 Iron deficiency anemia, unspecified: Secondary | ICD-10-CM | POA: Diagnosis not present

## 2017-11-21 DIAGNOSIS — Z992 Dependence on renal dialysis: Secondary | ICD-10-CM | POA: Diagnosis not present

## 2017-11-21 DIAGNOSIS — N186 End stage renal disease: Secondary | ICD-10-CM | POA: Diagnosis not present

## 2017-11-24 DIAGNOSIS — N2581 Secondary hyperparathyroidism of renal origin: Secondary | ICD-10-CM | POA: Diagnosis not present

## 2017-11-24 DIAGNOSIS — Z992 Dependence on renal dialysis: Secondary | ICD-10-CM | POA: Diagnosis not present

## 2017-11-24 DIAGNOSIS — D509 Iron deficiency anemia, unspecified: Secondary | ICD-10-CM | POA: Diagnosis not present

## 2017-11-24 DIAGNOSIS — N186 End stage renal disease: Secondary | ICD-10-CM | POA: Diagnosis not present

## 2017-11-25 ENCOUNTER — Ambulatory Visit (INDEPENDENT_AMBULATORY_CARE_PROVIDER_SITE_OTHER): Payer: Medicare Other | Admitting: Orthopedic Surgery

## 2017-11-26 ENCOUNTER — Other Ambulatory Visit (HOSPITAL_COMMUNITY): Payer: Self-pay | Admitting: Nephrology

## 2017-11-26 ENCOUNTER — Other Ambulatory Visit: Payer: Self-pay | Admitting: Student

## 2017-11-26 DIAGNOSIS — N186 End stage renal disease: Secondary | ICD-10-CM | POA: Diagnosis not present

## 2017-11-26 DIAGNOSIS — Z992 Dependence on renal dialysis: Secondary | ICD-10-CM | POA: Diagnosis not present

## 2017-11-27 ENCOUNTER — Other Ambulatory Visit (HOSPITAL_COMMUNITY): Payer: Self-pay | Admitting: Nephrology

## 2017-11-27 ENCOUNTER — Ambulatory Visit (HOSPITAL_COMMUNITY)
Admission: RE | Admit: 2017-11-27 | Discharge: 2017-11-27 | Disposition: A | Discharge status: Home / Self Care | Payer: Medicare Other | Source: Ambulatory Visit | Attending: Nephrology | Admitting: Nephrology

## 2017-11-27 ENCOUNTER — Encounter (HOSPITAL_COMMUNITY): Payer: Self-pay

## 2017-11-27 ENCOUNTER — Other Ambulatory Visit: Payer: Self-pay

## 2017-11-27 DIAGNOSIS — N2581 Secondary hyperparathyroidism of renal origin: Secondary | ICD-10-CM | POA: Diagnosis not present

## 2017-11-27 DIAGNOSIS — Y832 Surgical operation with anastomosis, bypass or graft as the cause of abnormal reaction of the patient, or of later complication, without mention of misadventure at the time of the procedure: Secondary | ICD-10-CM | POA: Diagnosis not present

## 2017-11-27 DIAGNOSIS — Z8673 Personal history of transient ischemic attack (TIA), and cerebral infarction without residual deficits: Secondary | ICD-10-CM | POA: Diagnosis not present

## 2017-11-27 DIAGNOSIS — Z886 Allergy status to analgesic agent status: Secondary | ICD-10-CM | POA: Diagnosis not present

## 2017-11-27 DIAGNOSIS — Z89421 Acquired absence of other right toe(s): Secondary | ICD-10-CM | POA: Diagnosis not present

## 2017-11-27 DIAGNOSIS — Z992 Dependence on renal dialysis: Secondary | ICD-10-CM | POA: Insufficient documentation

## 2017-11-27 DIAGNOSIS — M1990 Unspecified osteoarthritis, unspecified site: Secondary | ICD-10-CM | POA: Diagnosis not present

## 2017-11-27 DIAGNOSIS — Z9049 Acquired absence of other specified parts of digestive tract: Secondary | ICD-10-CM | POA: Insufficient documentation

## 2017-11-27 DIAGNOSIS — T82898A Other specified complication of vascular prosthetic devices, implants and grafts, initial encounter: Secondary | ICD-10-CM | POA: Diagnosis not present

## 2017-11-27 DIAGNOSIS — I350 Nonrheumatic aortic (valve) stenosis: Secondary | ICD-10-CM | POA: Diagnosis not present

## 2017-11-27 DIAGNOSIS — I120 Hypertensive chronic kidney disease with stage 5 chronic kidney disease or end stage renal disease: Secondary | ICD-10-CM | POA: Insufficient documentation

## 2017-11-27 DIAGNOSIS — N186 End stage renal disease: Secondary | ICD-10-CM | POA: Insufficient documentation

## 2017-11-27 DIAGNOSIS — Z79899 Other long term (current) drug therapy: Secondary | ICD-10-CM | POA: Insufficient documentation

## 2017-11-27 DIAGNOSIS — Z8719 Personal history of other diseases of the digestive system: Secondary | ICD-10-CM | POA: Diagnosis not present

## 2017-11-27 DIAGNOSIS — Z8249 Family history of ischemic heart disease and other diseases of the circulatory system: Secondary | ICD-10-CM | POA: Insufficient documentation

## 2017-11-27 DIAGNOSIS — D509 Iron deficiency anemia, unspecified: Secondary | ICD-10-CM | POA: Diagnosis not present

## 2017-11-27 DIAGNOSIS — Z9889 Other specified postprocedural states: Secondary | ICD-10-CM | POA: Insufficient documentation

## 2017-11-27 DIAGNOSIS — K210 Gastro-esophageal reflux disease with esophagitis: Secondary | ICD-10-CM | POA: Insufficient documentation

## 2017-11-27 DIAGNOSIS — Z87891 Personal history of nicotine dependence: Secondary | ICD-10-CM | POA: Diagnosis not present

## 2017-11-27 DIAGNOSIS — I429 Cardiomyopathy, unspecified: Secondary | ICD-10-CM | POA: Diagnosis not present

## 2017-11-27 DIAGNOSIS — F419 Anxiety disorder, unspecified: Secondary | ICD-10-CM | POA: Insufficient documentation

## 2017-11-27 DIAGNOSIS — T82858A Stenosis of vascular prosthetic devices, implants and grafts, initial encounter: Secondary | ICD-10-CM | POA: Diagnosis not present

## 2017-11-27 HISTORY — PX: IR US GUIDE VASC ACCESS RIGHT: IMG2390

## 2017-11-27 HISTORY — PX: IR THROMBECTOMY AV FISTULA W/THROMBOLYSIS/PTA INC/SHUNT/IMG RIGHT: IMG6119

## 2017-11-27 LAB — BASIC METABOLIC PANEL
ANION GAP: 20 — AB (ref 5–15)
BUN: 74 mg/dL — ABNORMAL HIGH (ref 8–23)
CALCIUM: 8.3 mg/dL — AB (ref 8.9–10.3)
CO2: 21 mmol/L — AB (ref 22–32)
Chloride: 95 mmol/L — ABNORMAL LOW (ref 98–111)
Creatinine, Ser: 13.16 mg/dL — ABNORMAL HIGH (ref 0.61–1.24)
GFR calc non Af Amer: 3 mL/min — ABNORMAL LOW (ref >60)
GFR, EST AFRICAN AMERICAN: 4 mL/min — AB (ref >60)
GLUCOSE: 88 mg/dL (ref 70–99)
POTASSIUM: 4.6 mmol/L (ref 3.5–5.1)
Sodium: 136 mmol/L (ref 135–145)

## 2017-11-27 LAB — CBC
HEMATOCRIT: 46.7 % (ref 39.0–52.0)
HEMOGLOBIN: 14.9 g/dL (ref 13.0–17.0)
MCH: 21.3 pg — ABNORMAL LOW (ref 26.0–34.0)
MCHC: 31.9 g/dL (ref 30.0–36.0)
MCV: 66.9 fL — ABNORMAL LOW (ref 78.0–100.0)
Platelets: 253 10*3/uL (ref 150–400)
RBC: 6.98 MIL/uL — ABNORMAL HIGH (ref 4.22–5.81)
RDW: 18.6 % — AB (ref 11.5–15.5)
WBC: 8.5 10*3/uL (ref 4.0–10.5)

## 2017-11-27 LAB — APTT: APTT: 34 s (ref 24–36)

## 2017-11-27 LAB — PROTIME-INR
INR: 1.03
Prothrombin Time: 13.4 seconds (ref 11.4–15.2)

## 2017-11-27 MED ORDER — FENTANYL CITRATE (PF) 100 MCG/2ML IJ SOLN
INTRAMUSCULAR | Status: AC | PRN
  Administered 2017-11-27 (×2): 25 ug via INTRAVENOUS

## 2017-11-27 MED ORDER — ALTEPLASE 2 MG IJ SOLR
INTRAMUSCULAR | Status: AC
  Administered 2017-11-27: 2 mg
  Filled 2017-11-27: qty 2

## 2017-11-27 MED ORDER — HEPARIN SODIUM (PORCINE) 1000 UNIT/ML IJ SOLN
INTRAMUSCULAR | Status: AC | PRN
  Administered 2017-11-27: 3000 [IU] via INTRAVENOUS

## 2017-11-27 MED ORDER — LIDOCAINE HCL (PF) 1 % IJ SOLN
INTRAMUSCULAR | Status: AC | PRN
  Administered 2017-11-27: 10 mL

## 2017-11-27 MED ORDER — CEFAZOLIN SODIUM-DEXTROSE 2-4 GM/100ML-% IV SOLN: 2.0000 g | INTRAVENOUS | Status: DC

## 2017-11-27 MED ORDER — MIDAZOLAM HCL 2 MG/2ML IJ SOLN
INTRAMUSCULAR | Status: AC
  Filled 2017-11-27: qty 2

## 2017-11-27 MED ORDER — LIDOCAINE HCL 1 % IJ SOLN
INTRAMUSCULAR | Status: AC
  Filled 2017-11-27: qty 20

## 2017-11-27 MED ORDER — MIDAZOLAM HCL 2 MG/2ML IJ SOLN
INTRAMUSCULAR | Status: AC | PRN
  Administered 2017-11-27: 0.5 mg via INTRAVENOUS

## 2017-11-27 MED ORDER — FENTANYL CITRATE (PF) 100 MCG/2ML IJ SOLN
INTRAMUSCULAR | Status: AC
  Filled 2017-11-27: qty 2

## 2017-11-27 MED ORDER — IOPAMIDOL (ISOVUE-300) INJECTION 61%
INTRAVENOUS | Status: AC
  Administered 2017-11-27: 90 mL
  Filled 2017-11-27: qty 100

## 2017-11-27 MED ORDER — HEPARIN SODIUM (PORCINE) 1000 UNIT/ML IJ SOLN
INTRAMUSCULAR | Status: AC
  Administered 2017-11-27: 3000 [IU]
  Filled 2017-11-27: qty 1

## 2017-11-27 MED ORDER — SODIUM CHLORIDE 0.9 % IV SOLN: INTRAVENOUS | Status: DC

## 2017-11-27 NOTE — H&P (Signed)
Chief Complaint: Patient was seen in consultation today for RUA dialysis graft declot at the request of [place]  Referring Physician(s): Fran Lowes  Supervising Physician: Markus Daft  Patient Status: [facility] - Out-pt  History of Present Illness: Joseph Odea. is a 77 y.o. male   Hx CVA; HTN ESRD RUA graft placed 01/2017-- in use since 06/2017 RUA dialysis graft clotted Last used Mon without issue Yesterday complete clotted  Last intervention just 10/21/17 in IR  Request for thrombolysis with possible angioplasty/stent placement. Possible tunneled dialysis catheter placement  Past Medical History:  Diagnosis Date  . Anxiety   . Aortic stenosis   . Arthritis   . CVA (cerebral infarction)   . ESRD (end stage renal disease) on dialysis [facility])    M/W/F at [facility] in [place]  . Essential hypertension   . Gangrene of right foot ([place])   . Gastric ulcer 2004  . History of cardiomyopathy    LVEF normal as of February 2017  . History of stroke    Left side weakness  . Iron deficiency anemia     Past Surgical History:  Procedure Laterality Date  . ABDOMINAL AORTAGRAM N/A 01/24/2012   Procedure: ABDOMINAL Maxcine Ham;  Surgeon: Elam Dutch, MD;  Location: [facility] CATH LAB;  Service: Cardiovascular;  Laterality: N/A;  . ABDOMINAL AORTOGRAM W/LOWER EXTREMITY N/A 04/15/2017   Procedure: ABDOMINAL AORTOGRAM W/LOWER EXTREMITY;  Surgeon: Angelia Mould, MD;  Location: [place] CV LAB;  Service: Cardiovascular;  Laterality: N/A;  . AMPUTATION Right 05/09/2017   Procedure: RIGHT TRANSMETATARSAL AMPUTATION;  Surgeon: [name], MD;  Location: [place];  Service: Orthopedics;  Laterality: Right;  . ARTERIOVENOUS GRAFT PLACEMENT Right right arm  . AV FISTULA PLACEMENT Left 08/31/2015   Procedure: ARTERIOVENOUS (AV) FISTULA CREATION- LEFT RADIOCEPHALIC;  Surgeon: Mal Misty, MD;  Location: [place];  Service: Vascular;  Laterality: Left;  . AV FISTULA  PLACEMENT Right 02/18/2017   Procedure: INSERTION OF ARTERIOVENOUS (AV) GORE-TEX GRAFT  RIGHT UPPER ARM;  Surgeon: Conrad Hewitt, MD;  Location: [place];  Service: Vascular;  Laterality: Right;  . BASCILIC VEIN TRANSPOSITION Left 12/19/2015   Procedure: FIRST STAGE BRACHIAL VEIN TRANSPOSITION;  Surgeon: Conrad Tieton, MD;  Location: [place];  Service: Vascular;  Laterality: Left;  . BASCILIC VEIN TRANSPOSITION Left 02/08/2016   Procedure: SECOND STAGE BRACHIAL VEIN TRANSPOSITION;  Surgeon: Conrad Pewaukee, MD;  Location: [place];  Service: Vascular;  Laterality: Left;  . CARDIAC CATHETERIZATION N/A 07/11/2015   Procedure: Left Heart Cath and Coronary Angiography;  Surgeon: Troy Sine, MD;  Location: [place] CV LAB;  Service: Cardiovascular;  Laterality: N/A;  . Carpel Tunnel Left Dec. 22, 2016  . CHOLECYSTECTOMY    . COLONOSCOPY  2004   Dr. Irving Shows, left sided diverticula and cecal polyp, path unknown  . COLONOSCOPY  10/29/2011   Procedure: COLONOSCOPY;  Surgeon: Daneil Dolin, MD;  [facility];  Service: Endoscopy;  Laterality: N/A;  10:15  . ESOPHAGOGASTRODUODENOSCOPY  11/2002   Dr. Gala Romney, erosive reflux esophagitis, multiple gastric ulcer and antral/bulbar erosions. Serologies positive for H.Pylori and was treated  . ESOPHAGOGASTRODUODENOSCOPY  11/20014   Dr. Gala Romney, small hh only, ulcers healed  . ESOPHAGOGASTRODUODENOSCOPY  09/21/2011   Dr Trevor Iha [facility], antral erosions, ?early GAVE  . FISTULOGRAM Left 12/10/2016   Procedure: THROMBECTOMY OF LEFT ARM ARTERIOVENOUS FISTULA;  Surgeon: Waynetta Sandy, MD;  Location: [place];  Service: Vascular;  Laterality: Left;  .  INSERTION OF DIALYSIS CATHETER Left 12/10/2016   Procedure: INSERTION OF TUNNELED DIALYSIS CATHETER;  Surgeon: Waynetta Sandy, MD;  Location: [place];  Service: Vascular;  Laterality: Left;  . IR GENERIC HISTORICAL  07/16/2016   IR REMOVAL TUN CV CATH W/O FL 07/16/2016 Saverio Danker, PA-C [facility]-INTERV RAD    . IR PTA ADDL CENTRAL DIALYSIS SEG THRU DIALY CIRCUIT RIGHT Right 10/21/2017  . IR REMOVAL TUN CV CATH W/O FL  05/12/2017  . IR THROMBECTOMY AV FISTULA W/THROMBOLYSIS/PTA INC/SHUNT/IMG RIGHT Right 10/21/2017  . IR US GUIDE VASC ACCESS RIGHT  10/21/2017  . LIGATION OF ARTERIOVENOUS  FISTULA Left 12/19/2015   Procedure: LIGATION OF RADIOCEPHALIC ARTERIOVENOUS  FISTULA;  Surgeon: Conrad Warrenton, MD;  Location: [place];  Service: Vascular;  Laterality: Left;  Marland Kitchen MASS EXCISION Right 02/18/2017   Procedure: EXCISION OF RIGHT AXILLARY EPIDERMAL INCLUSION CYST;  Surgeon: Conrad Melvin, MD;  Location: [place];  Service: Vascular;  Laterality: Right;  . PERIPHERAL VASCULAR CATHETERIZATION N/A 12/14/2015   Procedure: Fistulagram;  Surgeon: Conrad Fillmore, MD;  Location: [place] CV LAB;  Service: Cardiovascular;  Laterality: N/A;  . SHOULDER SURGERY    . ULTRASOUND GUIDANCE FOR VASCULAR ACCESS  04/15/2017   Procedure: Ultrasound Guidance For Vascular Access;  Surgeon: Angelia Mould, MD;  Location: [place] CV LAB;  Service: Cardiovascular;;  . UPPER EXTREMITY VENOGRAPHY Bilateral 12/17/2016   Procedure: Bilateral Upper Extremity Venography;  Surgeon: Serafina Mitchell, MD;  Location: [place] CV LAB;  Service: Cardiovascular;  Laterality: Bilateral;    Allergies: Aspirin  Medications: Prior to Admission medications   Medication Sig Start Date End Date Taking? Authorizing Provider  ALPRAZolam Duanne Moron) 0.5 MG tablet Take 0.5 mg by mouth daily. 10/06/15  Yes [provider]  atorvastatin (LIPITOR) 40 MG tablet Take 40 mg by mouth daily. 11/03/15  Yes [provider]  b complex-vitamin c-folic acid (NEPHRO-VITE) 0.8 MG TABS Take 0.8 mg by mouth See admin instructions. Takes on Tuesdays, Thursdays, Saturdays, and Sundays. Does not take on Mondays, Wednesdays, and Fridays due to Dialysis treatments.   Yes [provider]  cinacalcet (SENSIPAR) 60 MG tablet Take 60 mg by mouth every  evening. With evening meal   Yes [provider]  HYDROcodone-acetaminophen (NORCO/VICODIN) 5-325 MG tablet Take 1 tablet by mouth every 4 (four) hours as needed for moderate pain. 05/09/17  Yes [name], MD  meclizine (ANTIVERT) 25 MG tablet Take 25 mg by mouth daily as needed for dizziness (Non dialysis days).    Yes [provider]  mupirocin ointment (BACTROBAN) 2 % Apply 1 application topically daily. 07/01/17  Yes [name], MD  ondansetron (ZOFRAN) 8 MG tablet Take 1 tablet (8 mg total) by mouth every 8 (eight) hours as needed for nausea or vomiting. 03/24/17  Yes Daleen Bo, MD  oxyCODONE-acetaminophen (PERCOCET/ROXICET) 5-325 MG tablet Take 1 tablet by mouth every 6 (six) hours as needed for severe pain. 05/13/17  Yes [name], MD  promethazine (PHENERGAN) 25 MG tablet Take 25 mg by mouth every 8 (eight) hours as needed for nausea or vomiting.  08/03/10  Yes [provider]  sevelamer (RENVELA) 800 MG tablet Take 1,600-2,400 mg by mouth See admin instructions. Take 2400  mg by mouth 3 times daily with full meals and take 1600 mg by mouth with snacks.   Yes [provider]  silver sulfADIAZINE (SILVADENE) 1 % cream Apply 1 application topically daily. 09/17/17  Yes Coralyn Pear,  Lodema Hong, NP  nitroGLYCERIN (NITRODUR - DOSED IN MG/24 HR) 0.2 mg/hr patch Place 1 patch (0.2 mg total) onto the skin daily. Apply 1 patch to dorsum of foot daily. 06/12/17   Suzan Slick, NP     Family History  Problem Relation Age of Onset  . Hypertension Mother   . Colon cancer Neg Hx   . Liver disease Neg Hx     Social History   Socioeconomic History  . Marital status: Single    Spouse name: Not on file  . Number of children: 2  . Years of education: Not on file  . Highest education level: Not on file  Occupational History  . Occupation: retired, Licensed conveyancer    Employer: RETIRED  Social Needs  . Financial resource strain: Not on file  . Food insecurity:     Worry: Not on file    Inability: Not on file  . Transportation needs:    Medical: Not on file    Non-medical: Not on file  Tobacco Use  . Smoking status: Former Smoker    Last attempt to quit: 04/29/2004    Years since quitting: 13.5  . Smokeless tobacco: Former Systems developer    Types: Chew    Quit date: 01/16/1987  . Tobacco comment: quit 2006  Substance and Sexual Activity  . Alcohol use: No  . Drug use: No  . Sexual activity: Not on file  Lifestyle  . Physical activity:    Days per week: Not on file    Minutes per session: Not on file  . Stress: Not on file  Relationships  . Social connections:    Talks on phone: Not on file    Gets together: Not on file    Attends religious service: Not on file    Active member of club or organization: Not on file    Attends meetings of clubs or organizations: Not on file    Relationship status: Not on file  Other Topics Concern  . Not on file  Social History Narrative   Lives alone   Daughter 20-min away   Caffeine use: 32oz soda per day    Review of Systems: A 12 point ROS discussed and pertinent positives are indicated in the HPI above.  All other systems are negative.  Review of Systems  Constitutional: Negative for activity change, fatigue and fever.  Respiratory: Negative for cough and shortness of breath.   Cardiovascular: Negative for chest pain.  Gastrointestinal: Negative for abdominal pain.  Neurological: Negative for weakness.  Psychiatric/Behavioral: Negative for behavioral problems and confusion.    Vital Signs: BP (!) 158/92   Pulse 83   Temp 98.2 F (36.8 C)   Resp 18   Ht 5\' 5"  (1.651 m)   Wt 170 lb (77.1 kg)   SpO2 99%   BMI 28.29 kg/m   Physical Exam  Constitutional: He is oriented to person, place, and time.  Cardiovascular: Normal rate, regular rhythm and normal heart sounds.  Pulmonary/Chest: Effort normal and breath sounds normal.  Abdominal: Soft. Bowel sounds are normal.  Musculoskeletal: Normal range  of motion.  RUA graft - no thrill no pulse  Neurological: He is alert and oriented to person, place, and time.  Skin: Skin is warm and dry.  Psychiatric: He has a normal mood and affect. His behavior is normal. Judgment and thought content normal.  Nursing note and vitals reviewed.   Imaging: No results found.  Labs:  CBC: Recent Labs  05/09/17 0925 05/10/17 0803 05/12/17 0810 10/21/17 0724  WBC 8.3 10.1 10.8* 7.7  HGB 11.3* 11.3* 11.5* 15.8  HCT 36.0* 33.9* 34.7* 50.5  PLT 220 287 195 316    COAGS: Recent Labs    10/21/17 0724  INR 1.04    BMP: Recent Labs    05/09/17 0925 05/10/17 0803 05/12/17 0809 10/21/17 0724  NA 137 136 137 138  K 4.1 3.5 3.3* 4.9  CL 98* 97* 97* 99  CO2 26 23 25  21*  GLUCOSE 81 83 127* 90  BUN 25* 36* 36* 40*  CALCIUM 8.9 8.7* 9.2 8.5*  CREATININE 9.13* 10.70* 10.30* 14.56*  GFRNONAA 5* 4* 4* 3*  GFRAA 6* 5* 5* 3*    LIVER FUNCTION TESTS: Recent Labs    03/24/17 1459 03/31/17 0206 05/10/17 0803 05/12/17 0809  BILITOT 0.5 0.5  --   --   AST 30 24  --   --   ALT 20 20  --   --   ALKPHOS 98 88  --   --   PROT 8.5* 7.2  --   --   ALBUMIN 3.7 3.3* 3.0* 2.9*    TUMOR MARKERS: No results for input(s): AFPTM, CEA, CA199, CHROMGRNA in the last 8760 hours.  Assessment and Plan:  ESRD RUA dialysis graft clotted Used Mon and was fine-- clot noted Wed am Scheduled for thrombolysis with possible angioplasty/stent placement Possible tunneled dialysis catheter placement if needed Risks and benefits discussed with the patient including, but not limited to bleeding, infection, vascular injury, pulmonary embolism, need for tunneled HD catheter placement or even death.  All of the patient's questions were answered, patient is agreeable to proceed. Consent signed and in chart.   Thank you for this interesting consult.  I greatly enjoyed meeting Joseph TINNON Sr. and look forward to participating in their care.  A copy of this  report was sent to the requesting provider on this date.  Electronically Signed: Lavonia Drafts, PA-C 11/27/2017, 7:25 AM   I spent a total of    25 Minutes in face to face in clinical consultation, greater than 50% of which was counseling/coordinating care for RUA graft declot

## 2017-11-27 NOTE — Procedures (Signed)
Successful declot of right arm AV graft.  Venous anastomosis balloon angioplasty up to 6 mm.  Minimal blood loss.  No immediate complication.

## 2017-11-27 NOTE — Sedation Documentation (Signed)
Bed Rest to start at 1005.

## 2017-11-27 NOTE — Discharge Instructions (Addendum)
Care After Declot These instructions give you information about caring for yourself after your procedure. Your doctor may also give you more specific instructions. Call your doctor if you have any problems or questions after your procedure. Follow these instructions at home:  Rest as told by your doctor.  Take medicines only as told by your doctor.  There are many different ways to close and cover the biopsy site, including stitches (sutures), skin glue, and adhesive strips. Follow instructions from your doctor about: ? How to take care of your biopsy site. ? When and how you should change your bandage (dressing). ? When you should remove your dressing. You may remove dressing in 24-48 hours ? Removing whatever was used to close your biopsy site.  Check your biopsy site every day for signs of infection. Watch for: ? Redness, swelling, or pain. ? Fluid, blood, or pus. Contact a doctor if:  You have a fever.  You have redness, swelling, or pain at the biopsy site, and it lasts longer than a few days.  You have fluid, blood, or pus coming from the biopsy site.  You feel sick to your stomach (nauseous).  You throw up (vomit). Get help right away if:  You are short of breath.  You have trouble breathing.  Your chest hurts.  You feel dizzy or you pass out (faint).  You have bleeding that does not stop with pressure or a bandage.  You cough up blood.  Your belly (abdomen) hurts. This information is not intended to replace advice given to you by your health care provider. Make sure you discuss any questions you have with your health care provider. Document Released: 03/28/2008 Document Revised: 09/21/2015 Document Reviewed: 04/11/2014 Elsevier Interactive Patient Education  2018 [place].  Moderate Conscious Sedation, Adult, Care After These instructions provide you with information about caring for yourself after your procedure. Your health care provider may also give  you more specific instructions. Your treatment has been planned according to current medical practices, but problems sometimes occur. Call your health care provider if you have any problems or questions after your procedure. What can I expect after the procedure? After your procedure, it is common:  To feel sleepy for several hours.  To feel clumsy and have poor balance for several hours.  To have poor judgment for several hours.  To vomit if you eat too soon.  Follow these instructions at home: For at least 24 hours after the procedure:   Do not: ? Participate in activities where you could fall or become injured. ? Drive. ? Use heavy machinery. ? Drink alcohol. ? Take sleeping pills or medicines that cause drowsiness. ? Make important decisions or sign legal documents. ? Take care of children on your own.  Rest. Eating and drinking  Follow the diet recommended by your health care provider.  If you vomit: ? Drink water, juice, or soup when you can drink without vomiting. ? Make sure you have little or no nausea before eating solid foods. General instructions  Have a responsible adult stay with you until you are awake and alert.  Take over-the-counter and prescription medicines only as told by your health care provider.  If you smoke, do not smoke without supervision.  Keep all follow-up visits as told by your health care provider. This is important. Contact a health care provider if:  You keep feeling nauseous or you keep vomiting.  You feel light-headed.  You develop a rash.  You have a fever.  Get help right away if:  You have trouble breathing. This information is not intended to replace advice given to you by your health care provider. Make sure you discuss any questions you have with your health care provider. Document Released: 02/03/2013 Document Revised: 09/18/2015 Document Reviewed: 08/05/2015 Elsevier Interactive Patient Education  [employer].

## 2017-11-27 NOTE — Discharge Instructions (Signed)
Fibrinolytic Therapy Fibrinolytic therapy is the use of medicine to dissolve blood clots. This therapy is used to prevent serious problems caused by blood clots, such as:  Heart attack.  Stroke.  Organ damage.  How does this therapy work? Blood clots can keep blood from flowing to certain areas of the body. Fibrinolytic therapy dissolves blood clots so that blood is able to flow to any areas that the blood clot blocked. Fibrinolytic therapy works best if it is started as soon as possible after a blood clot is found. In people diagnosed with a stroke, it should be started right away. In people with heart attack symptoms, it should be started within 30 minutes after the symptoms started. What are the risks of this therapy? Risks of this therapy include:  Bleeding (hemorrhage). This is the most common risk.  Allergic reaction.  When is this therapy recommended? Your health care provider may recommend fibrinolytic therapy based on:  Your symptoms.  Your test results.  Your medical history.  A physical exam.  This therapy cannot be done in some cases, due to the increased risk of hemorrhage. You may not be able to have fibrinolytic therapy if:  You have had bleeding in your brain.  You have had a stroke within the past 3 months.  You have internal bleeding.  There may be a tear in your aorta (aortic dissection).  You have had a closed head injury within the past 3 months.  You have very high blood pressure.  You are taking certain medicines that thin the blood.  Fibrinolytic therapy may be an option for you even if:  You have had internal bleeding in the last 2-4 weeks.  You are pregnant.  You have had a heart attack within the past 3 months.  Your health care provider will carefully consider the benefits of fibrinolytic therapy as well as the risk of hemorrhage. When should I seek immediate medical care? Seek immediate medical care if you have started fibrinolytic  therapy and you have any of the following problems:  You vomit.  You cough up blood.  You have blood in your urine or bowel movements.  Your bowel movements look similar to coffee grounds.  Your urine looks pink.  You have a lot of bruising on your skin.  You have a severe headache that does not go away.  This information is not intended to replace advice given to you by your health care provider. Make sure you discuss any questions you have with your health care provider. Document Released: 07/06/2003 Document Revised: 08/30/2015 Document Reviewed: 10/28/2014 Elsevier Interactive Patient Education  2018 [place]. Moderate Conscious Sedation, Adult, Care After These instructions provide you with information about caring for yourself after your procedure. Your health care provider may also give you more specific instructions. Your treatment has been planned according to current medical practices, but problems sometimes occur. Call your health care provider if you have any problems or questions after your procedure. What can I expect after the procedure? After your procedure, it is common:  To feel sleepy for several hours.  To feel clumsy and have poor balance for several hours.  To have poor judgment for several hours.  To vomit if you eat too soon.  Follow these instructions at home: For at least 24 hours after the procedure:   Do not: ? Participate in activities where you could fall or become injured. ? Drive. ? Use heavy machinery. ? Drink alcohol. ? Take sleeping pills or  medicines that cause drowsiness. ? Make important decisions or sign legal documents. ? Take care of children on your own.  Rest. Eating and drinking  Follow the diet recommended by your health care provider.  If you vomit: ? Drink water, juice, or soup when you can drink without vomiting. ? Make sure you have little or no nausea before eating solid foods. General instructions  Have a  responsible adult stay with you until you are awake and alert.  Take over-the-counter and prescription medicines only as told by your health care provider.  If you smoke, do not smoke without supervision.  Keep all follow-up visits as told by your health care provider. This is important. Contact a health care provider if:  You keep feeling nauseous or you keep vomiting.  You feel light-headed.  You develop a rash.  You have a fever. Get help right away if:  You have trouble breathing. This information is not intended to replace advice given to you by your health care provider. Make sure you discuss any questions you have with your health care provider. Document Released: 02/03/2013 Document Revised: 09/18/2015 Document Reviewed: 08/05/2015 Elsevier Interactive Patient Education  [employer].

## 2017-11-28 DIAGNOSIS — N2581 Secondary hyperparathyroidism of renal origin: Secondary | ICD-10-CM | POA: Diagnosis not present

## 2017-11-28 DIAGNOSIS — D509 Iron deficiency anemia, unspecified: Secondary | ICD-10-CM | POA: Diagnosis not present

## 2017-11-28 DIAGNOSIS — N186 End stage renal disease: Secondary | ICD-10-CM | POA: Diagnosis not present

## 2017-11-28 DIAGNOSIS — Z992 Dependence on renal dialysis: Secondary | ICD-10-CM | POA: Diagnosis not present

## 2017-12-01 DIAGNOSIS — N186 End stage renal disease: Secondary | ICD-10-CM | POA: Diagnosis not present

## 2017-12-01 DIAGNOSIS — D509 Iron deficiency anemia, unspecified: Secondary | ICD-10-CM | POA: Diagnosis not present

## 2017-12-01 DIAGNOSIS — Z992 Dependence on renal dialysis: Secondary | ICD-10-CM | POA: Diagnosis not present

## 2017-12-01 DIAGNOSIS — N2581 Secondary hyperparathyroidism of renal origin: Secondary | ICD-10-CM | POA: Diagnosis not present

## 2017-12-02 DIAGNOSIS — G629 Polyneuropathy, unspecified: Secondary | ICD-10-CM | POA: Diagnosis not present

## 2017-12-02 DIAGNOSIS — L97521 Non-pressure chronic ulcer of other part of left foot limited to breakdown of skin: Secondary | ICD-10-CM | POA: Diagnosis not present

## 2017-12-03 DIAGNOSIS — N2581 Secondary hyperparathyroidism of renal origin: Secondary | ICD-10-CM | POA: Diagnosis not present

## 2017-12-03 DIAGNOSIS — Z992 Dependence on renal dialysis: Secondary | ICD-10-CM | POA: Diagnosis not present

## 2017-12-03 DIAGNOSIS — D509 Iron deficiency anemia, unspecified: Secondary | ICD-10-CM | POA: Diagnosis not present

## 2017-12-03 DIAGNOSIS — N186 End stage renal disease: Secondary | ICD-10-CM | POA: Diagnosis not present

## 2017-12-04 ENCOUNTER — Encounter (INDEPENDENT_AMBULATORY_CARE_PROVIDER_SITE_OTHER): Payer: Self-pay | Admitting: Orthopedic Surgery

## 2017-12-04 ENCOUNTER — Ambulatory Visit (INDEPENDENT_AMBULATORY_CARE_PROVIDER_SITE_OTHER): Payer: Medicare Other | Admitting: Orthopedic Surgery

## 2017-12-04 VITALS — Ht 65.0 in | Wt 170.0 lb

## 2017-12-04 DIAGNOSIS — L97411 Non-pressure chronic ulcer of right heel and midfoot limited to breakdown of skin: Secondary | ICD-10-CM | POA: Diagnosis not present

## 2017-12-04 DIAGNOSIS — Z89431 Acquired absence of right foot: Secondary | ICD-10-CM | POA: Diagnosis not present

## 2017-12-04 NOTE — Progress Notes (Signed)
Office Visit Note   Patient: Joseph VIRTS Sr.           Date of Birth: 1940/05/14           MRN: [id number] Visit Date: 12/04/2017              Requested by: [name], MD [address] [place], [place] [address] PCP: [name], MD  Chief Complaint  Patient presents with  . Right Foot - Follow-up    04/2017 right transmet amputation       HPI: Patient is a 77 year old gentleman who presents status post right transmetatarsal amputation with ulceration distally over the residual limb.  Patient states he has been using Silvadene dressing changes he notices a small open dime size ulcer with increased depth and a small amount of bloody drainage.  Patient is about 8 months out from his transmetatarsal amputation wearing regular shoes.  Assessment & Plan: Visit Diagnoses:  1. S/P transmetatarsal amputation of foot, right ([place])   2. Midfoot skin ulcer, right, limited to breakdown of skin ([place])     Plan: Ulcer was debrided patient will continue with wound care recommended decreasing his weightbearing.  Follow-Up Instructions: Return in about 4 weeks (around 01/01/2018).   Ortho Exam  Patient is alert, oriented, no adenopathy, well-dressed, normal affect, normal respiratory effort. Examination patient has an antalgic gait.  He has a large open wound over the residual limb.  After informed consent a 10 blade knife was used to debride the skin and soft tissue back to bleeding viable granulation tissue this was touched with silver nitrate the wound after debridement is 3 x 6 cm and 5 mm deep.  This does not probe to bone or tendon.  No purulence no abscess no signs of infection.  Imaging: No results found. No images are attached to the encounter.  Labs: Lab Results  Component Value Date   REPTSTATUS 02/22/2017 FINAL 02/18/2017   GRAMSTAIN  02/18/2017    RARE WBC PRESENT, PREDOMINANTLY PMN FEW GRAM POSITIVE COCCI    CULT  02/18/2017    NO GROWTH AEROBICALLY MIXED  ANAEROBIC FLORA PRESENT.  CALL LAB IF FURTHER IID REQUIRED.      Lab Results  Component Value Date   ALBUMIN 2.9 (L) 05/12/2017   ALBUMIN 3.0 (L) 05/10/2017   ALBUMIN 3.3 (L) 03/31/2017    Body mass index is 28.29 kg/m.  Orders:  No orders of the defined types were placed in this encounter.  No orders of the defined types were placed in this encounter.    Procedures: No procedures performed  Clinical Data: No additional findings.  ROS:  All other systems negative, except as noted in the HPI. Review of Systems  Objective: Vital Signs: Ht 5\' 5"  (1.651 m)   Wt 170 lb (77.1 kg)   BMI 28.29 kg/m   Specialty Comments:  No specialty comments available.  PMFS History: Patient Active Problem List   Diagnosis Date Noted  . S/P transmetatarsal amputation of foot, right ([place]) 05/09/2017  . PVD (peripheral vascular disease) ([place]) 04/15/2017  . Nonischemic cardiomyopathy ([place]) 08/15/2015  . Aortic stenosis 08/15/2015  . Moderate aortic stenosis 07/12/2015  . 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 CAD- 40-50% LAD at cath 07/11/15 07/11/2015  . Aspirin intolerance 07/11/2015  . Abnormal stress test   . ESRD on dialysis ([place]) 05/30/2015  . CTS (carpal tunnel syndrome) 02/28/2015  . PVD of LE - Dr Oneida Alar follows 08/20/2012  . Midfoot skin  ulcer, right, limited to breakdown of skin ([place]) 01/16/2012  . Encounter for screening colonoscopy 10/08/2011  . Melena 10/08/2011  . Other complications due to renal dialysis device, implant, and graft 09/24/2011   Past Medical History:  Diagnosis Date  . Anxiety   . Aortic stenosis   . Arthritis   . CVA (cerebral infarction)   . ESRD (end stage renal disease) on dialysis [facility])    M/W/F at [facility] in [place]  . Essential hypertension   . Gangrene of right foot ([place])   . Gastric ulcer 2004  . History of cardiomyopathy    LVEF normal as of February 2017  . History of stroke    Left side weakness  . Iron  deficiency anemia     Family History  Problem Relation Age of Onset  . Hypertension Mother   . Colon cancer Neg Hx   . Liver disease Neg Hx     Past Surgical History:  Procedure Laterality Date  . ABDOMINAL AORTAGRAM N/A 01/24/2012   Procedure: ABDOMINAL Maxcine Ham;  Surgeon: Elam Dutch, MD;  Location: [facility] CATH LAB;  Service: Cardiovascular;  Laterality: N/A;  . ABDOMINAL AORTOGRAM W/LOWER EXTREMITY N/A 04/15/2017   Procedure: ABDOMINAL AORTOGRAM W/LOWER EXTREMITY;  Surgeon: Angelia Mould, MD;  Location: [place] CV LAB;  Service: Cardiovascular;  Laterality: N/A;  . AMPUTATION Right 05/09/2017   Procedure: RIGHT TRANSMETATARSAL AMPUTATION;  Surgeon: [name], MD;  Location: [place];  Service: Orthopedics;  Laterality: Right;  . ARTERIOVENOUS GRAFT PLACEMENT Right right arm  . AV FISTULA PLACEMENT Left 08/31/2015   Procedure: ARTERIOVENOUS (AV) FISTULA CREATION- LEFT RADIOCEPHALIC;  Surgeon: Mal Misty, MD;  Location: [place];  Service: Vascular;  Laterality: Left;  . AV FISTULA PLACEMENT Right 02/18/2017   Procedure: INSERTION OF ARTERIOVENOUS (AV) GORE-TEX GRAFT  RIGHT UPPER ARM;  Surgeon: Conrad [place], MD;  Location: [place];  Service: Vascular;  Laterality: Right;  . BASCILIC VEIN TRANSPOSITION Left 12/19/2015   Procedure: FIRST STAGE BRACHIAL VEIN TRANSPOSITION;  Surgeon: Conrad [place], MD;  Location: [place];  Service: Vascular;  Laterality: Left;  . BASCILIC VEIN TRANSPOSITION Left 02/08/2016   Procedure: SECOND STAGE BRACHIAL VEIN TRANSPOSITION;  Surgeon: Conrad [place], MD;  Location: [place];  Service: Vascular;  Laterality: Left;  . CARDIAC CATHETERIZATION N/A 07/11/2015   Procedure: Left Heart Cath and Coronary Angiography;  Surgeon: Troy Sine, MD;  Location: [place] CV LAB;  Service: Cardiovascular;  Laterality: N/A;  . Carpel Tunnel Left Dec. 22, 2016  . CHOLECYSTECTOMY    . COLONOSCOPY  2004   Dr. Irving Shows, left sided diverticula and cecal polyp, path  unknown  . COLONOSCOPY  10/29/2011   Procedure: COLONOSCOPY;  Surgeon: Daneil Dolin, MD;  [facility];  Service: Endoscopy;  Laterality: N/A;  10:15  . ESOPHAGOGASTRODUODENOSCOPY  11/2002   Dr. Gala Romney, erosive reflux esophagitis, multiple gastric ulcer and antral/bulbar erosions. Serologies positive for H.Pylori and was treated  . ESOPHAGOGASTRODUODENOSCOPY  11/20014   Dr. Gala Romney, small hh only, ulcers healed  . ESOPHAGOGASTRODUODENOSCOPY  09/21/2011   Dr Trevor Iha [facility], antral erosions, ?early GAVE  . FISTULOGRAM Left 12/10/2016   Procedure: THROMBECTOMY OF LEFT ARM ARTERIOVENOUS FISTULA;  Surgeon: Waynetta Sandy, MD;  Location: [place];  Service: Vascular;  Laterality: Left;  . INSERTION OF DIALYSIS CATHETER Left 12/10/2016   Procedure: INSERTION OF TUNNELED DIALYSIS CATHETER;  Surgeon: Waynetta Sandy, MD;  Location: [place];  Service: Vascular;  Laterality: Left;  . IR GENERIC HISTORICAL  07/16/2016   IR REMOVAL TUN CV CATH W/O FL 07/16/2016 Saverio Danker, PA-C [facility]-INTERV RAD  . IR PTA ADDL CENTRAL DIALYSIS SEG THRU DIALY CIRCUIT RIGHT Right 10/21/2017  . IR REMOVAL TUN CV CATH W/O FL  05/12/2017  . IR THROMBECTOMY AV FISTULA W/THROMBOLYSIS/PTA INC/SHUNT/IMG RIGHT Right 10/21/2017  . IR THROMBECTOMY AV FISTULA W/THROMBOLYSIS/PTA INC/SHUNT/IMG RIGHT Right 11/27/2017  . IR US GUIDE VASC ACCESS RIGHT  10/21/2017  . IR US GUIDE VASC ACCESS RIGHT  11/27/2017  . LIGATION OF ARTERIOVENOUS  FISTULA Left 12/19/2015   Procedure: LIGATION OF RADIOCEPHALIC ARTERIOVENOUS  FISTULA;  Surgeon: Conrad [place], MD;  Location: [place];  Service: Vascular;  Laterality: Left;  Marland Kitchen MASS EXCISION Right 02/18/2017   Procedure: EXCISION OF RIGHT AXILLARY EPIDERMAL INCLUSION CYST;  Surgeon: Conrad [place], MD;  Location: [place];  Service: Vascular;  Laterality: Right;  . PERIPHERAL VASCULAR CATHETERIZATION N/A 12/14/2015   Procedure: Fistulagram;  Surgeon: Conrad [place], MD;  Location: [place] CV LAB;   Service: Cardiovascular;  Laterality: N/A;  . SHOULDER SURGERY    . ULTRASOUND GUIDANCE FOR VASCULAR ACCESS  04/15/2017   Procedure: Ultrasound Guidance For Vascular Access;  Surgeon: Angelia Mould, MD;  Location: [place] CV LAB;  Service: Cardiovascular;;  . UPPER EXTREMITY VENOGRAPHY Bilateral 12/17/2016   Procedure: Bilateral Upper Extremity Venography;  Surgeon: Serafina Mitchell, MD;  Location: [place] CV LAB;  Service: Cardiovascular;  Laterality: Bilateral;   Social History   Occupational History  . Occupation: retired, Licensed conveyancer    Employer: RETIRED  Tobacco Use  . Smoking status: Former Smoker    Last attempt to quit: 04/29/2004    Years since quitting: 13.6  . Smokeless tobacco: Former Systems developer    Types: Chew    Quit date: 01/16/1987  . Tobacco comment: quit 2006  Substance and Sexual Activity  . Alcohol use: No  . Drug use: No  . Sexual activity: Not on file

## 2017-12-05 DIAGNOSIS — D509 Iron deficiency anemia, unspecified: Secondary | ICD-10-CM | POA: Diagnosis not present

## 2017-12-05 DIAGNOSIS — N186 End stage renal disease: Secondary | ICD-10-CM | POA: Diagnosis not present

## 2017-12-05 DIAGNOSIS — Z992 Dependence on renal dialysis: Secondary | ICD-10-CM | POA: Diagnosis not present

## 2017-12-05 DIAGNOSIS — N2581 Secondary hyperparathyroidism of renal origin: Secondary | ICD-10-CM | POA: Diagnosis not present

## 2017-12-08 DIAGNOSIS — Z992 Dependence on renal dialysis: Secondary | ICD-10-CM | POA: Diagnosis not present

## 2017-12-08 DIAGNOSIS — N2581 Secondary hyperparathyroidism of renal origin: Secondary | ICD-10-CM | POA: Diagnosis not present

## 2017-12-08 DIAGNOSIS — D509 Iron deficiency anemia, unspecified: Secondary | ICD-10-CM | POA: Diagnosis not present

## 2017-12-08 DIAGNOSIS — N186 End stage renal disease: Secondary | ICD-10-CM | POA: Diagnosis not present

## 2017-12-10 DIAGNOSIS — N186 End stage renal disease: Secondary | ICD-10-CM | POA: Diagnosis not present

## 2017-12-10 DIAGNOSIS — N2581 Secondary hyperparathyroidism of renal origin: Secondary | ICD-10-CM | POA: Diagnosis not present

## 2017-12-10 DIAGNOSIS — Z992 Dependence on renal dialysis: Secondary | ICD-10-CM | POA: Diagnosis not present

## 2017-12-10 DIAGNOSIS — D509 Iron deficiency anemia, unspecified: Secondary | ICD-10-CM | POA: Diagnosis not present

## 2017-12-12 DIAGNOSIS — Z992 Dependence on renal dialysis: Secondary | ICD-10-CM | POA: Diagnosis not present

## 2017-12-12 DIAGNOSIS — N186 End stage renal disease: Secondary | ICD-10-CM | POA: Diagnosis not present

## 2017-12-12 DIAGNOSIS — N2581 Secondary hyperparathyroidism of renal origin: Secondary | ICD-10-CM | POA: Diagnosis not present

## 2017-12-12 DIAGNOSIS — D509 Iron deficiency anemia, unspecified: Secondary | ICD-10-CM | POA: Diagnosis not present

## 2017-12-15 DIAGNOSIS — Z992 Dependence on renal dialysis: Secondary | ICD-10-CM | POA: Diagnosis not present

## 2017-12-15 DIAGNOSIS — D509 Iron deficiency anemia, unspecified: Secondary | ICD-10-CM | POA: Diagnosis not present

## 2017-12-15 DIAGNOSIS — N186 End stage renal disease: Secondary | ICD-10-CM | POA: Diagnosis not present

## 2017-12-15 DIAGNOSIS — N2581 Secondary hyperparathyroidism of renal origin: Secondary | ICD-10-CM | POA: Diagnosis not present

## 2017-12-17 DIAGNOSIS — N186 End stage renal disease: Secondary | ICD-10-CM | POA: Diagnosis not present

## 2017-12-17 DIAGNOSIS — N2581 Secondary hyperparathyroidism of renal origin: Secondary | ICD-10-CM | POA: Diagnosis not present

## 2017-12-17 DIAGNOSIS — D509 Iron deficiency anemia, unspecified: Secondary | ICD-10-CM | POA: Diagnosis not present

## 2017-12-17 DIAGNOSIS — Z992 Dependence on renal dialysis: Secondary | ICD-10-CM | POA: Diagnosis not present

## 2017-12-19 DIAGNOSIS — Z992 Dependence on renal dialysis: Secondary | ICD-10-CM | POA: Diagnosis not present

## 2017-12-19 DIAGNOSIS — N186 End stage renal disease: Secondary | ICD-10-CM | POA: Diagnosis not present

## 2017-12-19 DIAGNOSIS — N2581 Secondary hyperparathyroidism of renal origin: Secondary | ICD-10-CM | POA: Diagnosis not present

## 2017-12-19 DIAGNOSIS — D509 Iron deficiency anemia, unspecified: Secondary | ICD-10-CM | POA: Diagnosis not present

## 2017-12-21 DIAGNOSIS — N2581 Secondary hyperparathyroidism of renal origin: Secondary | ICD-10-CM | POA: Diagnosis not present

## 2017-12-21 DIAGNOSIS — N186 End stage renal disease: Secondary | ICD-10-CM | POA: Diagnosis not present

## 2017-12-21 DIAGNOSIS — D509 Iron deficiency anemia, unspecified: Secondary | ICD-10-CM | POA: Diagnosis not present

## 2017-12-21 DIAGNOSIS — Z992 Dependence on renal dialysis: Secondary | ICD-10-CM | POA: Diagnosis not present

## 2017-12-22 DIAGNOSIS — N186 End stage renal disease: Secondary | ICD-10-CM | POA: Diagnosis not present

## 2017-12-22 DIAGNOSIS — D509 Iron deficiency anemia, unspecified: Secondary | ICD-10-CM | POA: Diagnosis not present

## 2017-12-22 DIAGNOSIS — N2581 Secondary hyperparathyroidism of renal origin: Secondary | ICD-10-CM | POA: Diagnosis not present

## 2017-12-22 DIAGNOSIS — Z992 Dependence on renal dialysis: Secondary | ICD-10-CM | POA: Diagnosis not present

## 2017-12-23 DIAGNOSIS — G629 Polyneuropathy, unspecified: Secondary | ICD-10-CM | POA: Diagnosis not present

## 2017-12-23 DIAGNOSIS — L97521 Non-pressure chronic ulcer of other part of left foot limited to breakdown of skin: Secondary | ICD-10-CM | POA: Diagnosis not present

## 2017-12-24 DIAGNOSIS — Z992 Dependence on renal dialysis: Secondary | ICD-10-CM | POA: Diagnosis not present

## 2017-12-24 DIAGNOSIS — N2581 Secondary hyperparathyroidism of renal origin: Secondary | ICD-10-CM | POA: Diagnosis not present

## 2017-12-24 DIAGNOSIS — D509 Iron deficiency anemia, unspecified: Secondary | ICD-10-CM | POA: Diagnosis not present

## 2017-12-24 DIAGNOSIS — N186 End stage renal disease: Secondary | ICD-10-CM | POA: Diagnosis not present

## 2017-12-26 DIAGNOSIS — Z992 Dependence on renal dialysis: Secondary | ICD-10-CM | POA: Diagnosis not present

## 2017-12-26 DIAGNOSIS — D509 Iron deficiency anemia, unspecified: Secondary | ICD-10-CM | POA: Diagnosis not present

## 2017-12-26 DIAGNOSIS — N2581 Secondary hyperparathyroidism of renal origin: Secondary | ICD-10-CM | POA: Diagnosis not present

## 2017-12-26 DIAGNOSIS — N186 End stage renal disease: Secondary | ICD-10-CM | POA: Diagnosis not present

## 2017-12-27 DIAGNOSIS — N186 End stage renal disease: Secondary | ICD-10-CM | POA: Diagnosis not present

## 2017-12-28 DIAGNOSIS — Z23 Encounter for immunization: Secondary | ICD-10-CM | POA: Diagnosis not present

## 2017-12-28 DIAGNOSIS — N186 End stage renal disease: Secondary | ICD-10-CM | POA: Diagnosis not present

## 2017-12-28 DIAGNOSIS — Z992 Dependence on renal dialysis: Secondary | ICD-10-CM | POA: Diagnosis not present

## 2017-12-28 DIAGNOSIS — D509 Iron deficiency anemia, unspecified: Secondary | ICD-10-CM | POA: Diagnosis not present

## 2017-12-28 DIAGNOSIS — D631 Anemia in chronic kidney disease: Secondary | ICD-10-CM | POA: Diagnosis not present

## 2017-12-28 DIAGNOSIS — N2581 Secondary hyperparathyroidism of renal origin: Secondary | ICD-10-CM | POA: Diagnosis not present

## 2017-12-29 DIAGNOSIS — N186 End stage renal disease: Secondary | ICD-10-CM | POA: Diagnosis not present

## 2017-12-29 DIAGNOSIS — Z23 Encounter for immunization: Secondary | ICD-10-CM | POA: Diagnosis not present

## 2017-12-29 DIAGNOSIS — D631 Anemia in chronic kidney disease: Secondary | ICD-10-CM | POA: Diagnosis not present

## 2017-12-29 DIAGNOSIS — Z992 Dependence on renal dialysis: Secondary | ICD-10-CM | POA: Diagnosis not present

## 2017-12-29 DIAGNOSIS — D509 Iron deficiency anemia, unspecified: Secondary | ICD-10-CM | POA: Diagnosis not present

## 2017-12-29 DIAGNOSIS — N2581 Secondary hyperparathyroidism of renal origin: Secondary | ICD-10-CM | POA: Diagnosis not present

## 2017-12-31 ENCOUNTER — Other Ambulatory Visit: Payer: Self-pay | Admitting: Radiology

## 2017-12-31 ENCOUNTER — Other Ambulatory Visit (HOSPITAL_COMMUNITY): Payer: Self-pay | Admitting: Medical

## 2017-12-31 ENCOUNTER — Other Ambulatory Visit: Payer: Self-pay | Admitting: Student

## 2017-12-31 DIAGNOSIS — N186 End stage renal disease: Secondary | ICD-10-CM

## 2018-01-01 ENCOUNTER — Other Ambulatory Visit (HOSPITAL_COMMUNITY): Payer: Self-pay | Admitting: Medical

## 2018-01-01 ENCOUNTER — Other Ambulatory Visit: Payer: Self-pay

## 2018-01-01 ENCOUNTER — Ambulatory Visit (HOSPITAL_COMMUNITY)
Admission: RE | Admit: 2018-01-01 | Discharge: 2018-01-01 | Disposition: A | Discharge status: Home / Self Care | Payer: Medicare Other | Source: Ambulatory Visit | Attending: Medical | Admitting: Medical

## 2018-01-01 ENCOUNTER — Ambulatory Visit (INDEPENDENT_AMBULATORY_CARE_PROVIDER_SITE_OTHER): Payer: Medicare Other | Admitting: Orthopedic Surgery

## 2018-01-01 ENCOUNTER — Encounter (HOSPITAL_COMMUNITY): Payer: Self-pay | Admitting: Diagnostic Radiology

## 2018-01-01 DIAGNOSIS — I350 Nonrheumatic aortic (valve) stenosis: Secondary | ICD-10-CM | POA: Diagnosis not present

## 2018-01-01 DIAGNOSIS — N186 End stage renal disease: Secondary | ICD-10-CM

## 2018-01-01 DIAGNOSIS — F419 Anxiety disorder, unspecified: Secondary | ICD-10-CM | POA: Insufficient documentation

## 2018-01-01 DIAGNOSIS — Y832 Surgical operation with anastomosis, bypass or graft as the cause of abnormal reaction of the patient, or of later complication, without mention of misadventure at the time of the procedure: Secondary | ICD-10-CM | POA: Diagnosis not present

## 2018-01-01 DIAGNOSIS — T82858A Stenosis of vascular prosthetic devices, implants and grafts, initial encounter: Secondary | ICD-10-CM | POA: Diagnosis not present

## 2018-01-01 DIAGNOSIS — M1990 Unspecified osteoarthritis, unspecified site: Secondary | ICD-10-CM | POA: Diagnosis not present

## 2018-01-01 DIAGNOSIS — I120 Hypertensive chronic kidney disease with stage 5 chronic kidney disease or end stage renal disease: Secondary | ICD-10-CM | POA: Insufficient documentation

## 2018-01-01 DIAGNOSIS — T82898A Other specified complication of vascular prosthetic devices, implants and grafts, initial encounter: Secondary | ICD-10-CM | POA: Diagnosis not present

## 2018-01-01 DIAGNOSIS — Z8673 Personal history of transient ischemic attack (TIA), and cerebral infarction without residual deficits: Secondary | ICD-10-CM | POA: Diagnosis not present

## 2018-01-01 DIAGNOSIS — Z87891 Personal history of nicotine dependence: Secondary | ICD-10-CM | POA: Insufficient documentation

## 2018-01-01 HISTORY — PX: IR DIALY SHUNT INTRO NEEDLE/INTRACATH INITIAL W/IMG RIGHT: IMG6115

## 2018-01-01 HISTORY — PX: IR US GUIDE VASC ACCESS RIGHT: IMG2390

## 2018-01-01 LAB — CBC
HEMATOCRIT: 46 % (ref 39.0–52.0)
HEMOGLOBIN: 14.4 g/dL (ref 13.0–17.0)
MCH: 21 pg — ABNORMAL LOW (ref 26.0–34.0)
MCHC: 31.3 g/dL (ref 30.0–36.0)
MCV: 67.1 fL — ABNORMAL LOW (ref 78.0–100.0)
Platelets: 237 10*3/uL (ref 150–400)
RBC: 6.86 MIL/uL — AB (ref 4.22–5.81)
RDW: 18.9 % — ABNORMAL HIGH (ref 11.5–15.5)
WBC: 8.7 10*3/uL (ref 4.0–10.5)

## 2018-01-01 LAB — BASIC METABOLIC PANEL
Anion gap: 16 — ABNORMAL HIGH (ref 5–15)
BUN: 41 mg/dL — ABNORMAL HIGH (ref 8–23)
CALCIUM: 8 mg/dL — AB (ref 8.9–10.3)
CO2: 25 mmol/L (ref 22–32)
Chloride: 98 mmol/L (ref 98–111)
Creatinine, Ser: 13.34 mg/dL — ABNORMAL HIGH (ref 0.61–1.24)
GFR, EST AFRICAN AMERICAN: 4 mL/min — AB (ref >60)
GFR, EST NON AFRICAN AMERICAN: 3 mL/min — AB (ref >60)
GLUCOSE: 79 mg/dL (ref 70–99)
POTASSIUM: 5.1 mmol/L (ref 3.5–5.1)
SODIUM: 139 mmol/L (ref 135–145)

## 2018-01-01 LAB — PROTIME-INR
INR: 1.03
PROTHROMBIN TIME: 13.4 s (ref 11.4–15.2)

## 2018-01-01 MED ORDER — FENTANYL CITRATE (PF) 100 MCG/2ML IJ SOLN
INTRAMUSCULAR | Status: AC | PRN
  Administered 2018-01-01: 50 ug via INTRAVENOUS

## 2018-01-01 MED ORDER — LIDOCAINE HCL 1 % IJ SOLN
INTRAMUSCULAR | Status: AC
  Filled 2018-01-01: qty 20

## 2018-01-01 MED ORDER — SODIUM CHLORIDE 0.9 % IV SOLN: INTRAVENOUS | Status: DC

## 2018-01-01 MED ORDER — MIDAZOLAM HCL 2 MG/2ML IJ SOLN
INTRAMUSCULAR | Status: AC | PRN
  Administered 2018-01-01: 1 mg via INTRAVENOUS

## 2018-01-01 MED ORDER — IOPAMIDOL (ISOVUE-300) INJECTION 61%
INTRAVENOUS | Status: AC
  Administered 2018-01-01: 60 mL
  Filled 2018-01-01: qty 100

## 2018-01-01 MED ORDER — FENTANYL CITRATE (PF) 100 MCG/2ML IJ SOLN
INTRAMUSCULAR | Status: AC
  Filled 2018-01-01: qty 2

## 2018-01-01 MED ORDER — MIDAZOLAM HCL 2 MG/2ML IJ SOLN
INTRAMUSCULAR | Status: AC
  Filled 2018-01-01: qty 2

## 2018-01-01 MED ORDER — LIDOCAINE HCL 1 % IJ SOLN
INTRAMUSCULAR | Status: AC | PRN
  Administered 2018-01-01: 5 mL

## 2018-01-01 NOTE — Procedures (Signed)
  Pre-operative Diagnosis: Poor flows in right upper arm graft      Post-operative Diagnosis: Patent right upper arm graft, no significant stenosis   Indications: Poor flows in right arm graft  Procedure: Right upper arm shuntogram  Findings: Patent graft, no significant stenosis.  No intervention.   Complications: None     EBL: Minimal  Plan: Graft is ready to use.  Will discharge to home after recovery from sedation.

## 2018-01-01 NOTE — Discharge Instructions (Signed)

## 2018-01-01 NOTE — H&P (Signed)
Chief Complaint: Patient was seen in consultation today for clotted RUA dialysis graft  Referring Physician(s): Fran Lowes  Supervising Physician: Markus Daft  Patient Status: [facility] - Out-pt  History of Present Illness: Ell Tiso. is a 77 y.o. male with past medical history of CVA, HTN, ESRD on dialysis via RUA graft originally placed in Oct 2018.  He has presented to radiology for clotted x2 in the past- s/p declotting procedure 10/21/17 and 11/27/17.  States he was able to complete dialysis to his knowledge at last session and is unable to provide additional information as to why the dialysis center referred him for procedure today.   Request for declotting procedure with thrombolysis, possible angioplasty/stent placement to re-establish access.   Past Medical History:  Diagnosis Date  . Anxiety   . Aortic stenosis   . Arthritis   . CVA (cerebral infarction)   . ESRD (end stage renal disease) on dialysis [facility])    M/W/F at [facility] in [place]  . Essential hypertension   . Gangrene of right foot ([place])   . Gastric ulcer 2004  . History of cardiomyopathy    LVEF normal as of February 2017  . History of stroke    Left side weakness  . Iron deficiency anemia     Past Surgical History:  Procedure Laterality Date  . ABDOMINAL AORTAGRAM N/A 01/24/2012   Procedure: ABDOMINAL Maxcine Ham;  Surgeon: Elam Dutch, MD;  Location: [facility] CATH LAB;  Service: Cardiovascular;  Laterality: N/A;  . ABDOMINAL AORTOGRAM W/LOWER EXTREMITY N/A 04/15/2017   Procedure: ABDOMINAL AORTOGRAM W/LOWER EXTREMITY;  Surgeon: Angelia Mould, MD;  Location: [place] CV LAB;  Service: Cardiovascular;  Laterality: N/A;  . AMPUTATION Right 05/09/2017   Procedure: RIGHT TRANSMETATARSAL AMPUTATION;  Surgeon: [name], MD;  Location: [place];  Service: Orthopedics;  Laterality: Right;  . ARTERIOVENOUS GRAFT PLACEMENT Right right arm  . AV FISTULA PLACEMENT Left 08/31/2015   Procedure:  ARTERIOVENOUS (AV) FISTULA CREATION- LEFT RADIOCEPHALIC;  Surgeon: Mal Misty, MD;  Location: [place];  Service: Vascular;  Laterality: Left;  . AV FISTULA PLACEMENT Right 02/18/2017   Procedure: INSERTION OF ARTERIOVENOUS (AV) GORE-TEX GRAFT  RIGHT UPPER ARM;  Surgeon: Conrad [place], MD;  Location: [place];  Service: Vascular;  Laterality: Right;  . BASCILIC VEIN TRANSPOSITION Left 12/19/2015   Procedure: FIRST STAGE BRACHIAL VEIN TRANSPOSITION;  Surgeon: Conrad [place], MD;  Location: [place];  Service: Vascular;  Laterality: Left;  . BASCILIC VEIN TRANSPOSITION Left 02/08/2016   Procedure: SECOND STAGE BRACHIAL VEIN TRANSPOSITION;  Surgeon: Conrad [place], MD;  Location: [place];  Service: Vascular;  Laterality: Left;  . CARDIAC CATHETERIZATION N/A 07/11/2015   Procedure: Left Heart Cath and Coronary Angiography;  Surgeon: Troy Sine, MD;  Location: [place] CV LAB;  Service: Cardiovascular;  Laterality: N/A;  . Carpel Tunnel Left Dec. 22, 2016  . CHOLECYSTECTOMY    . COLONOSCOPY  2004   Dr. Irving Shows, left sided diverticula and cecal polyp, path unknown  . COLONOSCOPY  10/29/2011   Procedure: COLONOSCOPY;  Surgeon: Daneil Dolin, MD;  [facility];  Service: Endoscopy;  Laterality: N/A;  10:15  . ESOPHAGOGASTRODUODENOSCOPY  11/2002   Dr. Gala Romney, erosive reflux esophagitis, multiple gastric ulcer and antral/bulbar erosions. Serologies positive for H.Pylori and was treated  . ESOPHAGOGASTRODUODENOSCOPY  11/20014   Dr. Gala Romney, small hh only, ulcers healed  . ESOPHAGOGASTRODUODENOSCOPY  09/21/2011   Dr Trevor Iha HH, antral erosions, ?early  GAVE  . FISTULOGRAM Left 12/10/2016   Procedure: THROMBECTOMY OF LEFT ARM ARTERIOVENOUS FISTULA;  Surgeon: Waynetta Sandy, MD;  Location: [place];  Service: Vascular;  Laterality: Left;  . INSERTION OF DIALYSIS CATHETER Left 12/10/2016   Procedure: INSERTION OF TUNNELED DIALYSIS CATHETER;  Surgeon: Waynetta Sandy, MD;  Location: [place];  Service: Vascular;  Laterality: Left;  . IR GENERIC HISTORICAL  07/16/2016   IR REMOVAL TUN CV CATH W/O FL 07/16/2016 Saverio Danker, PA-C [facility]  . IR PTA ADDL CENTRAL DIALYSIS SEG THRU DIALY CIRCUIT RIGHT Right 10/21/2017  . IR REMOVAL TUN CV CATH W/O FL  05/12/2017  . IR THROMBECTOMY AV FISTULA W/THROMBOLYSIS/PTA INC/SHUNT/IMG RIGHT Right 10/21/2017  . IR THROMBECTOMY AV FISTULA W/THROMBOLYSIS/PTA INC/SHUNT/IMG RIGHT Right 11/27/2017  . IR US GUIDE VASC ACCESS RIGHT  10/21/2017  . IR US GUIDE VASC ACCESS RIGHT  11/27/2017  . LIGATION OF ARTERIOVENOUS  FISTULA Left 12/19/2015   Procedure: LIGATION OF RADIOCEPHALIC ARTERIOVENOUS  FISTULA;  Surgeon: Conrad [place], MD;  Location: [place];  Service: Vascular;  Laterality: Left;  Marland Kitchen MASS EXCISION Right 02/18/2017   Procedure: EXCISION OF RIGHT AXILLARY EPIDERMAL INCLUSION CYST;  Surgeon: Conrad [place], MD;  Location: [place];  Service: Vascular;  Laterality: Right;  . PERIPHERAL VASCULAR CATHETERIZATION N/A 12/14/2015   Procedure: Fistulagram;  Surgeon: Conrad [place], MD;  Location: [place] CV LAB;  Service: Cardiovascular;  Laterality: N/A;  . SHOULDER SURGERY    . ULTRASOUND GUIDANCE FOR VASCULAR ACCESS  04/15/2017   Procedure: Ultrasound Guidance For Vascular Access;  Surgeon: Angelia Mould, MD;  Location: [place] CV LAB;  Service: Cardiovascular;;  . UPPER EXTREMITY VENOGRAPHY Bilateral 12/17/2016   Procedure: Bilateral Upper Extremity Venography;  Surgeon: Serafina Mitchell, MD;  Location: [place] CV LAB;  Service: Cardiovascular;  Laterality: Bilateral;    Allergies: Aspirin  Medications: Prior to Admission medications   Medication Sig Start Date End Date Taking? Authorizing Provider  ALPRAZolam Duanne Moron) 0.5 MG tablet Take 0.5 mg by mouth daily. 10/06/15  Yes [provider]  atorvastatin (LIPITOR) 40 MG tablet Take 40 mg by mouth daily. 11/03/15  Yes [provider]  b complex-vitamin c-folic acid  (NEPHRO-VITE) 0.8 MG TABS Take 0.8 mg by mouth See admin instructions. Takes on Tuesdays, Thursdays, Saturdays, and Sundays. Does not take on Mondays, Wednesdays, and Fridays due to Dialysis treatments.   Yes [provider]  cinacalcet (SENSIPAR) 60 MG tablet Take 60 mg by mouth every evening. With evening meal   Yes [provider]  HYDROcodone-acetaminophen (NORCO/VICODIN) 5-325 MG tablet Take 1 tablet by mouth every 4 (four) hours as needed for moderate pain. 05/09/17  Yes [name], MD  meclizine (ANTIVERT) 25 MG tablet Take 25 mg by mouth daily as needed for dizziness (Non dialysis days).    Yes [provider]  mupirocin ointment (BACTROBAN) 2 % Apply 1 application topically daily. 07/01/17  Yes [name], MD  ondansetron (ZOFRAN) 8 MG tablet Take 1 tablet (8 mg total) by mouth every 8 (eight) hours as needed for nausea or vomiting. 03/24/17  Yes Daleen Bo, MD  oxyCODONE-acetaminophen (PERCOCET/ROXICET) 5-325 MG tablet Take 1 tablet by mouth every 6 (six) hours as needed for severe pain. 05/13/17  Yes [name], MD  promethazine (PHENERGAN) 25 MG tablet Take 25 mg by mouth every 8 (eight) hours as needed for nausea or vomiting.  08/03/10  Yes [provider]  sevelamer (RENVELA)  800 MG tablet Take 1,600-2,400 mg by mouth See admin instructions. Take 2400  mg by mouth 3 times daily with full meals and take 1600 mg by mouth with snacks.   Yes [provider]  silver sulfADIAZINE (SILVADENE) 1 % cream Apply 1 application topically daily. 09/17/17  Yes Suzan Slick, NP  nitroGLYCERIN (NITRODUR - DOSED IN MG/24 HR) 0.2 mg/hr patch Place 1 patch (0.2 mg total) onto the skin daily. Apply 1 patch to dorsum of foot daily. 06/12/17   Suzan Slick, NP     Family History  Problem Relation Age of Onset  . Hypertension Mother   . Colon cancer Neg Hx   . Liver disease Neg Hx     Social History   Socioeconomic History  . Marital status:  Single    Spouse name: Not on file  . Number of children: 2  . Years of education: Not on file  . Highest education level: Not on file  Occupational History  . Occupation: retired, Licensed conveyancer    Employer: RETIRED  Social Needs  . Financial resource strain: Not on file  . Food insecurity:    Worry: Not on file    Inability: Not on file  . Transportation needs:    Medical: Not on file    Non-medical: Not on file  Tobacco Use  . Smoking status: Former Smoker    Last attempt to quit: 04/29/2004    Years since quitting: 13.6  . Smokeless tobacco: Former Systems developer    Types: Chew    Quit date: 01/16/1987  . Tobacco comment: quit 2006  Substance and Sexual Activity  . Alcohol use: No  . Drug use: No  . Sexual activity: Not on file  Lifestyle  . Physical activity:    Days per week: Not on file    Minutes per session: Not on file  . Stress: Not on file  Relationships  . Social connections:    Talks on phone: Not on file    Gets together: Not on file    Attends religious service: Not on file    Active member of club or organization: Not on file    Attends meetings of clubs or organizations: Not on file    Relationship status: Not on file  Other Topics Concern  . Not on file  Social History Narrative   Lives alone   Daughter 20-min away   Caffeine use: 32oz soda per day    Review of Systems: A 12 point ROS discussed and pertinent positives are indicated in the HPI above.  All other systems are negative.  Review of Systems  Constitutional: Negative for activity change, fatigue and fever.  Respiratory: Negative for cough and shortness of breath.   Cardiovascular: Negative for chest pain.  Gastrointestinal: Negative for abdominal pain, diarrhea, nausea and vomiting.  Neurological: Negative for weakness.  Psychiatric/Behavioral: Negative for behavioral problems and confusion.    Vital Signs: BP 140/84   Pulse 93   Temp (!) 97.3 F (36.3 C)   Resp 18   Ht 5\' 5"  (1.651 m)    Wt 170 lb (77.1 kg)   SpO2 97%   BMI 28.29 kg/m   Physical Exam  Constitutional: He is oriented to person, place, and time. He appears well-developed. No distress.  Neck: Normal range of motion. Neck supple.  Cardiovascular: Normal rate, regular rhythm and normal heart sounds.  Pulmonary/Chest: Effort normal and breath sounds normal. No respiratory distress.  Musculoskeletal: Normal range of  motion.  RUA graft with palpable thrill throughout.   Neurological: He is alert and oriented to person, place, and time.  Skin: Skin is warm and dry. He is not diaphoretic.  Psychiatric: He has a normal mood and affect. His behavior is normal. Judgment and thought content normal.  Nursing note and vitals reviewed.   Imaging: No results found.  Labs:  CBC: Recent Labs    05/12/17 0810 10/21/17 0724 11/27/17 0649 01/01/18 1215  WBC 10.8* 7.7 8.5 8.7  HGB 11.5* 15.8 14.9 14.4  HCT 34.7* 50.5 46.7 46.0  PLT 195 316 253 237    COAGS: Recent Labs    10/21/17 0724 11/27/17 0649  INR 1.04 1.03  APTT  --  34    BMP: Recent Labs    05/12/17 0809 10/21/17 0724 11/27/17 0649 01/01/18 1215  NA 137 138 136 139  K 3.3* 4.9 4.6 5.1  CL 97* 99 95* 98  CO2 25 21* 21* 25  GLUCOSE 127* 90 88 79  BUN 36* 40* 74* 41*  CALCIUM 9.2 8.5* 8.3* 8.0*  CREATININE 10.30* 14.56* 13.16* 13.34*  GFRNONAA 4* 3* 3* 3*  GFRAA 5* 3* 4* 4*    LIVER FUNCTION TESTS: Recent Labs    03/24/17 1459 03/31/17 0206 05/10/17 0803 05/12/17 0809  BILITOT 0.5 0.5  --   --   AST 30 24  --   --   ALT 20 20  --   --   ALKPHOS 98 88  --   --   PROT 8.5* 7.2  --   --   ALBUMIN 3.7 3.3* 3.0* 2.9*    TUMOR MARKERS: No results for input(s): AFPTM, CEA, CA199, CHROMGRNA in the last 8760 hours.  Assessment and Plan: Patient with past medical history of ESRD on HD via RUA graft presents with complaint of clotted access.  IR consulted for declotting procedure at the request of Dr. Lowanda Foster. Patient presents  today in their usual state of health. His graft does have a palpable thrill throughout on assessment today.  Patient unable to provide additional information as to why he was referred for procedure today.  He has been NPO and is not currently on blood thinners.  Plan to proceed with angiogram, possible interventions if needed.  Patient understands that a tunneled line may be placed if no flow established through graft.   Risks and benefits discussed with the patient including, but not limited to bleeding, infection, vascular injury, pulmonary embolism, need for tunneled HD catheter placement or even death.  All of the patient's questions were answered, patient is agreeable to proceed. Consent signed and in chart.  Thank you for this interesting consult.  I greatly enjoyed meeting KEION NEELS Sr. and look forward to participating in their care.  A copy of this report was sent to the requesting provider on this date.  Electronically Signed: Docia Barrier, PA 01/01/2018, 1:52 PM   I spent a total of    25 Minutes in face to face in clinical consultation, greater than 50% of which was counseling/coordinating care for RUA graft declot

## 2018-01-01 NOTE — Sedation Documentation (Signed)
Patient breathing through mouth. ETCO2 monitor reading erroneous and sporadic at times.

## 2018-01-01 NOTE — Progress Notes (Signed)
Lab called blood was hemolyzed for PT new order placed and lab called to redraw.

## 2018-01-02 DIAGNOSIS — D631 Anemia in chronic kidney disease: Secondary | ICD-10-CM | POA: Diagnosis not present

## 2018-01-02 DIAGNOSIS — D509 Iron deficiency anemia, unspecified: Secondary | ICD-10-CM | POA: Diagnosis not present

## 2018-01-02 DIAGNOSIS — Z23 Encounter for immunization: Secondary | ICD-10-CM | POA: Diagnosis not present

## 2018-01-02 DIAGNOSIS — N2581 Secondary hyperparathyroidism of renal origin: Secondary | ICD-10-CM | POA: Diagnosis not present

## 2018-01-02 DIAGNOSIS — N186 End stage renal disease: Secondary | ICD-10-CM | POA: Diagnosis not present

## 2018-01-02 DIAGNOSIS — Z992 Dependence on renal dialysis: Secondary | ICD-10-CM | POA: Diagnosis not present

## 2018-01-05 DIAGNOSIS — D631 Anemia in chronic kidney disease: Secondary | ICD-10-CM | POA: Diagnosis not present

## 2018-01-05 DIAGNOSIS — D509 Iron deficiency anemia, unspecified: Secondary | ICD-10-CM | POA: Diagnosis not present

## 2018-01-05 DIAGNOSIS — N2581 Secondary hyperparathyroidism of renal origin: Secondary | ICD-10-CM | POA: Diagnosis not present

## 2018-01-05 DIAGNOSIS — Z992 Dependence on renal dialysis: Secondary | ICD-10-CM | POA: Diagnosis not present

## 2018-01-05 DIAGNOSIS — Z23 Encounter for immunization: Secondary | ICD-10-CM | POA: Diagnosis not present

## 2018-01-05 DIAGNOSIS — N186 End stage renal disease: Secondary | ICD-10-CM | POA: Diagnosis not present

## 2018-01-06 DIAGNOSIS — Z1331 Encounter for screening for depression: Secondary | ICD-10-CM | POA: Diagnosis not present

## 2018-01-06 DIAGNOSIS — Z1389 Encounter for screening for other disorder: Secondary | ICD-10-CM | POA: Diagnosis not present

## 2018-01-06 DIAGNOSIS — I2510 Atherosclerotic heart disease of native coronary artery without angina pectoris: Secondary | ICD-10-CM | POA: Diagnosis not present

## 2018-01-06 DIAGNOSIS — K219 Gastro-esophageal reflux disease without esophagitis: Secondary | ICD-10-CM | POA: Diagnosis not present

## 2018-01-06 DIAGNOSIS — M109 Gout, unspecified: Secondary | ICD-10-CM | POA: Diagnosis not present

## 2018-01-06 DIAGNOSIS — N186 End stage renal disease: Secondary | ICD-10-CM | POA: Diagnosis not present

## 2018-01-07 DIAGNOSIS — D509 Iron deficiency anemia, unspecified: Secondary | ICD-10-CM | POA: Diagnosis not present

## 2018-01-07 DIAGNOSIS — Z23 Encounter for immunization: Secondary | ICD-10-CM | POA: Diagnosis not present

## 2018-01-07 DIAGNOSIS — N2581 Secondary hyperparathyroidism of renal origin: Secondary | ICD-10-CM | POA: Diagnosis not present

## 2018-01-07 DIAGNOSIS — Z992 Dependence on renal dialysis: Secondary | ICD-10-CM | POA: Diagnosis not present

## 2018-01-07 DIAGNOSIS — D631 Anemia in chronic kidney disease: Secondary | ICD-10-CM | POA: Diagnosis not present

## 2018-01-07 DIAGNOSIS — N186 End stage renal disease: Secondary | ICD-10-CM | POA: Diagnosis not present

## 2018-01-08 ENCOUNTER — Ambulatory Visit (INDEPENDENT_AMBULATORY_CARE_PROVIDER_SITE_OTHER): Payer: Medicare Other | Admitting: Orthopedic Surgery

## 2018-01-08 ENCOUNTER — Encounter (INDEPENDENT_AMBULATORY_CARE_PROVIDER_SITE_OTHER): Payer: Self-pay | Admitting: Orthopedic Surgery

## 2018-01-08 VITALS — Ht 65.0 in | Wt 170.0 lb

## 2018-01-08 DIAGNOSIS — Z89431 Acquired absence of right foot: Secondary | ICD-10-CM

## 2018-01-08 DIAGNOSIS — L97411 Non-pressure chronic ulcer of right heel and midfoot limited to breakdown of skin: Secondary | ICD-10-CM | POA: Diagnosis not present

## 2018-01-08 NOTE — Progress Notes (Signed)
Office Visit Note   Patient: Joseph WOLFSON Sr.           Date of Birth: Dec 03, 1940           MRN: [id number] Visit Date: 01/08/2018              Requested by: [name], MD [address] [place], [place] [address] PCP: [name], MD  Chief Complaint  Patient presents with  . Right Foot - Routine Post Op      HPI: Patient is a 77 year old gentleman with peripheral vascular disease status post transmetatarsal amputation of the right who presents with 2 ulcerations on the plantar aspect of the right foot.  Patient states he minimizes his weightbearing and is wearing athletic sneakers.  Assessment & Plan: Visit Diagnoses:  1. S/P transmetatarsal amputation of foot, right ([place])   2. Midfoot skin ulcer, right, limited to breakdown of skin ([place])     Plan: Recommended continue with Silvadene dressing changes to the 2 wounds daily minimize his weightbearing follow-up for wound reevaluation  Follow-Up Instructions: Return in about 4 weeks (around 02/05/2018).   Ortho Exam  Patient is alert, oriented, no adenopathy, well-dressed, normal affect, normal respiratory effort. Examination patient's foot is plantigrade he has 2 large ulcers on plantar aspect of the right foot.  There is no redness no cellulitis no signs of infection there is no ischemic changes he does have venous stasis insufficiency changes.  After informed consent a 10 blade knife was used to debride skin and soft tissue back to bleeding viable granulation tissue.  Silver nitrate was used for hemostasis.  The medial ulcer is 3 cm in diameter and 5 mm deep the lateral ulcer is 2 x 4 cm and 3 mm deep.  There is no exposed bone or tendon no abscess no drainage.  Imaging: No results found. No images are attached to the encounter.  Labs: Lab Results  Component Value Date   REPTSTATUS 02/22/2017 FINAL 02/18/2017   GRAMSTAIN  02/18/2017    RARE WBC PRESENT, PREDOMINANTLY PMN FEW GRAM POSITIVE COCCI    CULT   02/18/2017    NO GROWTH AEROBICALLY MIXED ANAEROBIC FLORA PRESENT.  CALL LAB IF FURTHER IID REQUIRED.      Lab Results  Component Value Date   ALBUMIN 2.9 (L) 05/12/2017   ALBUMIN 3.0 (L) 05/10/2017   ALBUMIN 3.3 (L) 03/31/2017    Body mass index is 28.29 kg/m.  Orders:  No orders of the defined types were placed in this encounter.  No orders of the defined types were placed in this encounter.    Procedures: No procedures performed  Clinical Data: No additional findings.  ROS:  All other systems negative, except as noted in the HPI. Review of Systems  Objective: Vital Signs: Ht 5\' 5"  (1.651 m)   Wt 170 lb (77.1 kg)   BMI 28.29 kg/m   Specialty Comments:  No specialty comments available.  PMFS History: Patient Active Problem List   Diagnosis Date Noted  . S/P transmetatarsal amputation of foot, right ([place]) 05/09/2017  . PVD (peripheral vascular disease) ([place]) 04/15/2017  . Nonischemic cardiomyopathy ([place]) 08/15/2015  . Aortic stenosis 08/15/2015  . Moderate aortic stenosis 07/12/2015  . 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 CAD- 40-50% LAD at cath 07/11/15 07/11/2015  . Aspirin intolerance 07/11/2015  . Abnormal stress test   . ESRD on dialysis ([place]) 05/30/2015  . CTS (carpal tunnel syndrome) 02/28/2015  . PVD of LE -  Dr Oneida Alar follows 08/20/2012  . Midfoot skin ulcer, right, limited to breakdown of skin ([place]) 01/16/2012  . Encounter for screening colonoscopy 10/08/2011  . Melena 10/08/2011  . Other complications due to renal dialysis device, implant, and graft 09/24/2011   Past Medical History:  Diagnosis Date  . Anxiety   . Aortic stenosis   . Arthritis   . CVA (cerebral infarction)   . ESRD (end stage renal disease) on dialysis [facility])    M/W/F at [facility] in [place]  . Essential hypertension   . Gangrene of right foot ([place])   . Gastric ulcer 2004  . History of cardiomyopathy    LVEF normal as of February 2017  . History of  stroke    Left side weakness  . Iron deficiency anemia     Family History  Problem Relation Age of Onset  . Hypertension Mother   . Colon cancer Neg Hx   . Liver disease Neg Hx     Past Surgical History:  Procedure Laterality Date  . ABDOMINAL AORTAGRAM N/A 01/24/2012   Procedure: ABDOMINAL Maxcine Ham;  Surgeon: Elam Dutch, MD;  Location: [facility] CATH LAB;  Service: Cardiovascular;  Laterality: N/A;  . ABDOMINAL AORTOGRAM W/LOWER EXTREMITY N/A 04/15/2017   Procedure: ABDOMINAL AORTOGRAM W/LOWER EXTREMITY;  Surgeon: Angelia Mould, MD;  Location: [place] CV LAB;  Service: Cardiovascular;  Laterality: N/A;  . AMPUTATION Right 05/09/2017   Procedure: RIGHT TRANSMETATARSAL AMPUTATION;  Surgeon: [name], MD;  Location: [place];  Service: Orthopedics;  Laterality: Right;  . ARTERIOVENOUS GRAFT PLACEMENT Right right arm  . AV FISTULA PLACEMENT Left 08/31/2015   Procedure: ARTERIOVENOUS (AV) FISTULA CREATION- LEFT RADIOCEPHALIC;  Surgeon: Mal Misty, MD;  Location: [place];  Service: Vascular;  Laterality: Left;  . AV FISTULA PLACEMENT Right 02/18/2017   Procedure: INSERTION OF ARTERIOVENOUS (AV) GORE-TEX GRAFT  RIGHT UPPER ARM;  Surgeon: Conrad Babson Park, MD;  Location: [place];  Service: Vascular;  Laterality: Right;  . BASCILIC VEIN TRANSPOSITION Left 12/19/2015   Procedure: FIRST STAGE BRACHIAL VEIN TRANSPOSITION;  Surgeon: Conrad Bridgewater, MD;  Location: [place];  Service: Vascular;  Laterality: Left;  . BASCILIC VEIN TRANSPOSITION Left 02/08/2016   Procedure: SECOND STAGE BRACHIAL VEIN TRANSPOSITION;  Surgeon: Conrad Bonanza, MD;  Location: [place];  Service: Vascular;  Laterality: Left;  . CARDIAC CATHETERIZATION N/A 07/11/2015   Procedure: Left Heart Cath and Coronary Angiography;  Surgeon: Troy Sine, MD;  Location: [place] CV LAB;  Service: Cardiovascular;  Laterality: N/A;  . Carpel Tunnel Left Dec. 22, 2016  . CHOLECYSTECTOMY    . COLONOSCOPY  2004   Dr. Irving Shows, left  sided diverticula and cecal polyp, path unknown  . COLONOSCOPY  10/29/2011   Procedure: COLONOSCOPY;  Surgeon: Daneil Dolin, MD;  [facility];  Service: Endoscopy;  Laterality: N/A;  10:15  . ESOPHAGOGASTRODUODENOSCOPY  11/2002   Dr. Gala Romney, erosive reflux esophagitis, multiple gastric ulcer and antral/bulbar erosions. Serologies positive for H.Pylori and was treated  . ESOPHAGOGASTRODUODENOSCOPY  11/20014   Dr. Gala Romney, small hh only, ulcers healed  . ESOPHAGOGASTRODUODENOSCOPY  09/21/2011   Dr Trevor Iha [facility], antral erosions, ?early GAVE  . FISTULOGRAM Left 12/10/2016   Procedure: THROMBECTOMY OF LEFT ARM ARTERIOVENOUS FISTULA;  Surgeon: Waynetta Sandy, MD;  Location: [place];  Service: Vascular;  Laterality: Left;  . INSERTION OF DIALYSIS CATHETER Left 12/10/2016   Procedure: INSERTION OF TUNNELED DIALYSIS CATHETER;  Surgeon: Waynetta Sandy, MD;  [facility];  Service: Vascular;  Laterality: Left;  . IR DIALY SHUNT INTRO NEEDLE/INTRACATH INITIAL W/IMG RIGHT Right 01/01/2018  . IR GENERIC HISTORICAL  07/16/2016   IR REMOVAL TUN CV CATH W/O FL 07/16/2016 Saverio Danker, PA-C [facility]  . IR PTA ADDL CENTRAL DIALYSIS SEG THRU DIALY CIRCUIT RIGHT Right 10/21/2017  . IR REMOVAL TUN CV CATH W/O FL  05/12/2017  . IR THROMBECTOMY AV FISTULA W/THROMBOLYSIS/PTA INC/SHUNT/IMG RIGHT Right 10/21/2017  . IR THROMBECTOMY AV FISTULA W/THROMBOLYSIS/PTA INC/SHUNT/IMG RIGHT Right 11/27/2017  . IR US GUIDE VASC ACCESS RIGHT  10/21/2017  . IR US GUIDE VASC ACCESS RIGHT  11/27/2017  . IR US GUIDE VASC ACCESS RIGHT  01/01/2018  . LIGATION OF ARTERIOVENOUS  FISTULA Left 12/19/2015   Procedure: LIGATION OF RADIOCEPHALIC ARTERIOVENOUS  FISTULA;  Surgeon: Conrad Clarksburg, MD;  Location: [place];  Service: Vascular;  Laterality: Left;  Marland Kitchen MASS EXCISION Right 02/18/2017   Procedure: EXCISION OF RIGHT AXILLARY EPIDERMAL INCLUSION CYST;  Surgeon: Conrad Wiota, MD;  Location: [place];  Service: Vascular;   Laterality: Right;  . PERIPHERAL VASCULAR CATHETERIZATION N/A 12/14/2015   Procedure: Fistulagram;  Surgeon: Conrad Ellicott, MD;  Location: [place] CV LAB;  Service: Cardiovascular;  Laterality: N/A;  . SHOULDER SURGERY    . ULTRASOUND GUIDANCE FOR VASCULAR ACCESS  04/15/2017   Procedure: Ultrasound Guidance For Vascular Access;  Surgeon: Angelia Mould, MD;  Location: [place] [facility] LAB;  Service: Cardiovascular;;  . UPPER EXTREMITY VENOGRAPHY Bilateral 12/17/2016   Procedure: Bilateral Upper Extremity Venography;  Surgeon: Serafina Mitchell, MD;  Location: [place] CV LAB;  Service: Cardiovascular;  Laterality: Bilateral;   Social History   Occupational History  . Occupation: retired, Licensed conveyancer    Employer: RETIRED  Tobacco Use  . Smoking status: Former Smoker    Last attempt to quit: 04/29/2004    Years since quitting: 13.7  . Smokeless tobacco: Former Systems developer    Types: Chew    Quit date: 01/16/1987  . Tobacco comment: quit 2006  Substance and Sexual Activity  . Alcohol use: No  . Drug use: No  . Sexual activity: Not on file

## 2018-01-09 DIAGNOSIS — N186 End stage renal disease: Secondary | ICD-10-CM | POA: Diagnosis not present

## 2018-01-09 DIAGNOSIS — D509 Iron deficiency anemia, unspecified: Secondary | ICD-10-CM | POA: Diagnosis not present

## 2018-01-09 DIAGNOSIS — Z992 Dependence on renal dialysis: Secondary | ICD-10-CM | POA: Diagnosis not present

## 2018-01-09 DIAGNOSIS — Z23 Encounter for immunization: Secondary | ICD-10-CM | POA: Diagnosis not present

## 2018-01-09 DIAGNOSIS — N2581 Secondary hyperparathyroidism of renal origin: Secondary | ICD-10-CM | POA: Diagnosis not present

## 2018-01-09 DIAGNOSIS — D631 Anemia in chronic kidney disease: Secondary | ICD-10-CM | POA: Diagnosis not present

## 2018-01-12 DIAGNOSIS — Z992 Dependence on renal dialysis: Secondary | ICD-10-CM | POA: Diagnosis not present

## 2018-01-12 DIAGNOSIS — N2581 Secondary hyperparathyroidism of renal origin: Secondary | ICD-10-CM | POA: Diagnosis not present

## 2018-01-12 DIAGNOSIS — Z23 Encounter for immunization: Secondary | ICD-10-CM | POA: Diagnosis not present

## 2018-01-12 DIAGNOSIS — N186 End stage renal disease: Secondary | ICD-10-CM | POA: Diagnosis not present

## 2018-01-12 DIAGNOSIS — D631 Anemia in chronic kidney disease: Secondary | ICD-10-CM | POA: Diagnosis not present

## 2018-01-12 DIAGNOSIS — D509 Iron deficiency anemia, unspecified: Secondary | ICD-10-CM | POA: Diagnosis not present

## 2018-01-14 DIAGNOSIS — Z23 Encounter for immunization: Secondary | ICD-10-CM | POA: Diagnosis not present

## 2018-01-14 DIAGNOSIS — Z992 Dependence on renal dialysis: Secondary | ICD-10-CM | POA: Diagnosis not present

## 2018-01-14 DIAGNOSIS — D509 Iron deficiency anemia, unspecified: Secondary | ICD-10-CM | POA: Diagnosis not present

## 2018-01-14 DIAGNOSIS — N2581 Secondary hyperparathyroidism of renal origin: Secondary | ICD-10-CM | POA: Diagnosis not present

## 2018-01-14 DIAGNOSIS — N186 End stage renal disease: Secondary | ICD-10-CM | POA: Diagnosis not present

## 2018-01-14 DIAGNOSIS — D631 Anemia in chronic kidney disease: Secondary | ICD-10-CM | POA: Diagnosis not present

## 2018-01-16 DIAGNOSIS — Z23 Encounter for immunization: Secondary | ICD-10-CM | POA: Diagnosis not present

## 2018-01-16 DIAGNOSIS — N2581 Secondary hyperparathyroidism of renal origin: Secondary | ICD-10-CM | POA: Diagnosis not present

## 2018-01-16 DIAGNOSIS — D631 Anemia in chronic kidney disease: Secondary | ICD-10-CM | POA: Diagnosis not present

## 2018-01-16 DIAGNOSIS — Z992 Dependence on renal dialysis: Secondary | ICD-10-CM | POA: Diagnosis not present

## 2018-01-16 DIAGNOSIS — D509 Iron deficiency anemia, unspecified: Secondary | ICD-10-CM | POA: Diagnosis not present

## 2018-01-16 DIAGNOSIS — N186 End stage renal disease: Secondary | ICD-10-CM | POA: Diagnosis not present

## 2018-01-19 DIAGNOSIS — N2581 Secondary hyperparathyroidism of renal origin: Secondary | ICD-10-CM | POA: Diagnosis not present

## 2018-01-19 DIAGNOSIS — N186 End stage renal disease: Secondary | ICD-10-CM | POA: Diagnosis not present

## 2018-01-19 DIAGNOSIS — Z23 Encounter for immunization: Secondary | ICD-10-CM | POA: Diagnosis not present

## 2018-01-19 DIAGNOSIS — D509 Iron deficiency anemia, unspecified: Secondary | ICD-10-CM | POA: Diagnosis not present

## 2018-01-19 DIAGNOSIS — D631 Anemia in chronic kidney disease: Secondary | ICD-10-CM | POA: Diagnosis not present

## 2018-01-19 DIAGNOSIS — Z992 Dependence on renal dialysis: Secondary | ICD-10-CM | POA: Diagnosis not present

## 2018-01-20 DIAGNOSIS — D631 Anemia in chronic kidney disease: Secondary | ICD-10-CM | POA: Diagnosis not present

## 2018-01-20 DIAGNOSIS — N186 End stage renal disease: Secondary | ICD-10-CM | POA: Diagnosis not present

## 2018-01-20 DIAGNOSIS — D509 Iron deficiency anemia, unspecified: Secondary | ICD-10-CM | POA: Diagnosis not present

## 2018-01-20 DIAGNOSIS — Z992 Dependence on renal dialysis: Secondary | ICD-10-CM | POA: Diagnosis not present

## 2018-01-20 DIAGNOSIS — Z23 Encounter for immunization: Secondary | ICD-10-CM | POA: Diagnosis not present

## 2018-01-20 DIAGNOSIS — N2581 Secondary hyperparathyroidism of renal origin: Secondary | ICD-10-CM | POA: Diagnosis not present

## 2018-01-21 DIAGNOSIS — N2581 Secondary hyperparathyroidism of renal origin: Secondary | ICD-10-CM | POA: Diagnosis not present

## 2018-01-21 DIAGNOSIS — D631 Anemia in chronic kidney disease: Secondary | ICD-10-CM | POA: Diagnosis not present

## 2018-01-21 DIAGNOSIS — Z992 Dependence on renal dialysis: Secondary | ICD-10-CM | POA: Diagnosis not present

## 2018-01-21 DIAGNOSIS — N186 End stage renal disease: Secondary | ICD-10-CM | POA: Diagnosis not present

## 2018-01-21 DIAGNOSIS — D509 Iron deficiency anemia, unspecified: Secondary | ICD-10-CM | POA: Diagnosis not present

## 2018-01-21 DIAGNOSIS — Z23 Encounter for immunization: Secondary | ICD-10-CM | POA: Diagnosis not present

## 2018-01-23 DIAGNOSIS — Z992 Dependence on renal dialysis: Secondary | ICD-10-CM | POA: Diagnosis not present

## 2018-01-23 DIAGNOSIS — N186 End stage renal disease: Secondary | ICD-10-CM | POA: Diagnosis not present

## 2018-01-23 DIAGNOSIS — Z23 Encounter for immunization: Secondary | ICD-10-CM | POA: Diagnosis not present

## 2018-01-23 DIAGNOSIS — N2581 Secondary hyperparathyroidism of renal origin: Secondary | ICD-10-CM | POA: Diagnosis not present

## 2018-01-23 DIAGNOSIS — D631 Anemia in chronic kidney disease: Secondary | ICD-10-CM | POA: Diagnosis not present

## 2018-01-23 DIAGNOSIS — D509 Iron deficiency anemia, unspecified: Secondary | ICD-10-CM | POA: Diagnosis not present

## 2018-01-26 DIAGNOSIS — N2581 Secondary hyperparathyroidism of renal origin: Secondary | ICD-10-CM | POA: Diagnosis not present

## 2018-01-26 DIAGNOSIS — Z992 Dependence on renal dialysis: Secondary | ICD-10-CM | POA: Diagnosis not present

## 2018-01-26 DIAGNOSIS — Z23 Encounter for immunization: Secondary | ICD-10-CM | POA: Diagnosis not present

## 2018-01-26 DIAGNOSIS — N186 End stage renal disease: Secondary | ICD-10-CM | POA: Diagnosis not present

## 2018-01-26 DIAGNOSIS — D631 Anemia in chronic kidney disease: Secondary | ICD-10-CM | POA: Diagnosis not present

## 2018-01-26 DIAGNOSIS — D509 Iron deficiency anemia, unspecified: Secondary | ICD-10-CM | POA: Diagnosis not present

## 2018-01-27 DIAGNOSIS — D631 Anemia in chronic kidney disease: Secondary | ICD-10-CM | POA: Diagnosis not present

## 2018-01-27 DIAGNOSIS — N2581 Secondary hyperparathyroidism of renal origin: Secondary | ICD-10-CM | POA: Diagnosis not present

## 2018-01-27 DIAGNOSIS — N186 End stage renal disease: Secondary | ICD-10-CM | POA: Diagnosis not present

## 2018-01-27 DIAGNOSIS — D509 Iron deficiency anemia, unspecified: Secondary | ICD-10-CM | POA: Diagnosis not present

## 2018-01-27 DIAGNOSIS — Z992 Dependence on renal dialysis: Secondary | ICD-10-CM | POA: Diagnosis not present

## 2018-01-28 DIAGNOSIS — D631 Anemia in chronic kidney disease: Secondary | ICD-10-CM | POA: Diagnosis not present

## 2018-01-28 DIAGNOSIS — D509 Iron deficiency anemia, unspecified: Secondary | ICD-10-CM | POA: Diagnosis not present

## 2018-01-28 DIAGNOSIS — N186 End stage renal disease: Secondary | ICD-10-CM | POA: Diagnosis not present

## 2018-01-28 DIAGNOSIS — Z992 Dependence on renal dialysis: Secondary | ICD-10-CM | POA: Diagnosis not present

## 2018-01-28 DIAGNOSIS — N2581 Secondary hyperparathyroidism of renal origin: Secondary | ICD-10-CM | POA: Diagnosis not present

## 2018-01-30 ENCOUNTER — Other Ambulatory Visit (HOSPITAL_COMMUNITY)
Admission: RE | Admit: 2018-01-30 | Discharge: 2018-01-30 | Disposition: A | Discharge status: Home / Self Care | Payer: Medicare Other | Source: Other Acute Inpatient Hospital | Attending: Nephrology | Admitting: Nephrology

## 2018-01-30 DIAGNOSIS — D631 Anemia in chronic kidney disease: Secondary | ICD-10-CM | POA: Diagnosis not present

## 2018-01-30 DIAGNOSIS — D649 Anemia, unspecified: Secondary | ICD-10-CM | POA: Insufficient documentation

## 2018-01-30 DIAGNOSIS — D509 Iron deficiency anemia, unspecified: Secondary | ICD-10-CM | POA: Diagnosis not present

## 2018-01-30 DIAGNOSIS — N2581 Secondary hyperparathyroidism of renal origin: Secondary | ICD-10-CM | POA: Diagnosis not present

## 2018-01-30 DIAGNOSIS — Z992 Dependence on renal dialysis: Secondary | ICD-10-CM | POA: Diagnosis not present

## 2018-01-30 DIAGNOSIS — N186 End stage renal disease: Secondary | ICD-10-CM | POA: Diagnosis not present

## 2018-01-30 LAB — HEMOGLOBIN AND HEMATOCRIT, BLOOD
HCT: 47.5 % (ref 39.0–52.0)
Hemoglobin: 15.8 g/dL (ref 13.0–17.0)

## 2018-02-02 DIAGNOSIS — D509 Iron deficiency anemia, unspecified: Secondary | ICD-10-CM | POA: Diagnosis not present

## 2018-02-02 DIAGNOSIS — D631 Anemia in chronic kidney disease: Secondary | ICD-10-CM | POA: Diagnosis not present

## 2018-02-02 DIAGNOSIS — N2581 Secondary hyperparathyroidism of renal origin: Secondary | ICD-10-CM | POA: Diagnosis not present

## 2018-02-02 DIAGNOSIS — N186 End stage renal disease: Secondary | ICD-10-CM | POA: Diagnosis not present

## 2018-02-02 DIAGNOSIS — Z992 Dependence on renal dialysis: Secondary | ICD-10-CM | POA: Diagnosis not present

## 2018-02-03 ENCOUNTER — Encounter (INDEPENDENT_AMBULATORY_CARE_PROVIDER_SITE_OTHER): Payer: Self-pay | Admitting: Orthopedic Surgery

## 2018-02-03 ENCOUNTER — Ambulatory Visit (INDEPENDENT_AMBULATORY_CARE_PROVIDER_SITE_OTHER): Payer: Medicare Other | Admitting: Physician Assistant

## 2018-02-03 VITALS — Ht 65.0 in | Wt 170.0 lb

## 2018-02-03 DIAGNOSIS — I739 Peripheral vascular disease, unspecified: Secondary | ICD-10-CM | POA: Diagnosis not present

## 2018-02-03 DIAGNOSIS — Z992 Dependence on renal dialysis: Secondary | ICD-10-CM | POA: Diagnosis not present

## 2018-02-03 DIAGNOSIS — Z89431 Acquired absence of right foot: Secondary | ICD-10-CM | POA: Diagnosis not present

## 2018-02-03 DIAGNOSIS — L97411 Non-pressure chronic ulcer of right heel and midfoot limited to breakdown of skin: Secondary | ICD-10-CM | POA: Diagnosis not present

## 2018-02-03 DIAGNOSIS — N186 End stage renal disease: Secondary | ICD-10-CM

## 2018-02-03 NOTE — Progress Notes (Signed)
Office Visit Note   Patient: Joseph WIRICK Sr.           Date of Birth: 04-13-1941           MRN: [id number] Visit Date: 02/03/2018              Requested by: [name], MD [address] [place], [place] [address] PCP: [name], MD  Chief Complaint  Patient presents with  . Right Foot - Wound Check, Follow-up      HPI:   The patient is a 77 yo gentleman with PVD , S/P transmetatarsal amputation in Jan. 2019 who presents for follow up of 2 ulcerations of the plantar aspect of the right foot distally. He uses orthotics in his athletic shoes and tries not to weight bear much over the foot. No pain over the areas and minimal drainage. He is using Silvadene cream to the areas daily after cleaning.   Assessment & Plan: Visit Diagnoses:  1. S/P transmetatarsal amputation of foot, right ([place])   2. Midfoot skin ulcer, right, limited to breakdown of skin ([place])   3. PVD (peripheral vascular disease) ([place])   4. ESRD on dialysis [facility])     Plan: After informed consent the ulcers of the right foot were debrided of callus and soft tissue and skin with a #10 blade knife and the patient tolerated this well.  He will continue to use Silvadene cream to the right foot ulcers daily following cleaning with soap and water.  Minimize weightbearing over the right distal foot as much as possible.  He will follow-up in 4 weeks or sooner should he have difficulty in the interim.  Follow-Up Instructions: Return in about 4 weeks (around 03/03/2018).   Ortho Exam  Patient is alert, oriented, no adenopathy, well-dressed, normal affect, normal respiratory effort. The patient has 2 ulcers over the plantar aspect of the right foot.  There is no signs of cellulitis or infection.  After informed consent and #10 blade knife was used to debride skin and soft tissue and callus back to viable granulation.  The medial ulcer was to 3 cm in diameter and 0.5 mm in depth.  The lateral ulcer was 2 cm diameter  and 3 mm in depth.  There was no exposed bone or tendon and no abscess.  Imaging: No results found. No images are attached to the encounter.  Labs: Lab Results  Component Value Date   REPTSTATUS 02/22/2017 FINAL 02/18/2017   GRAMSTAIN  02/18/2017    RARE WBC PRESENT, PREDOMINANTLY PMN FEW GRAM POSITIVE COCCI    CULT  02/18/2017    NO GROWTH AEROBICALLY MIXED ANAEROBIC FLORA PRESENT.  CALL LAB IF FURTHER IID REQUIRED.      Lab Results  Component Value Date   ALBUMIN 2.9 (L) 05/12/2017   ALBUMIN 3.0 (L) 05/10/2017   ALBUMIN 3.3 (L) 03/31/2017    Body mass index is 28.29 kg/m.  Orders:  No orders of the defined types were placed in this encounter.  No orders of the defined types were placed in this encounter.    Procedures: No procedures performed  Clinical Data: No additional findings.  ROS:  All other systems negative, except as noted in the HPI. Review of Systems  Objective: Vital Signs: Ht 5\' 5"  (1.651 m)   Wt 170 lb (77.1 kg)   BMI 28.29 kg/m   Specialty Comments:  No specialty comments available.  PMFS History: Patient Active Problem List   Diagnosis Date Noted  .  S/P transmetatarsal amputation of foot, right ([place]) 05/09/2017  . PVD (peripheral vascular disease) ([place]) 04/15/2017  . Nonischemic cardiomyopathy ([place]) 08/15/2015  . Aortic stenosis 08/15/2015  . Moderate aortic stenosis 07/12/2015  . 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 CAD- 40-50% LAD at cath 07/11/15 07/11/2015  . Aspirin intolerance 07/11/2015  . Abnormal stress test   . ESRD on dialysis ([place]) 05/30/2015  . CTS (carpal tunnel syndrome) 02/28/2015  . PVD of LE - Dr Oneida Alar follows 08/20/2012  . Midfoot skin ulcer, right, limited to breakdown of skin ([place]) 01/16/2012  . Encounter for screening colonoscopy 10/08/2011  . Melena 10/08/2011  . Other complications due to renal dialysis device, implant, and graft 09/24/2011   Past Medical History:  Diagnosis Date    . Anxiety   . Aortic stenosis   . Arthritis   . CVA (cerebral infarction)   . ESRD (end stage renal disease) on dialysis [facility])    M/W/F at [facility] in [place]  . Essential hypertension   . Gangrene of right foot ([place])   . Gastric ulcer 2004  . History of cardiomyopathy    LVEF normal as of February 2017  . History of stroke    Left side weakness  . Iron deficiency anemia     Family History  Problem Relation Age of Onset  . Hypertension Mother   . Colon cancer Neg Hx   . Liver disease Neg Hx     Past Surgical History:  Procedure Laterality Date  . ABDOMINAL AORTAGRAM N/A 01/24/2012   Procedure: ABDOMINAL Maxcine Ham;  Surgeon: Elam Dutch, MD;  Location: [facility] CATH LAB;  Service: Cardiovascular;  Laterality: N/A;  . ABDOMINAL AORTOGRAM W/LOWER EXTREMITY N/A 04/15/2017   Procedure: ABDOMINAL AORTOGRAM W/LOWER EXTREMITY;  Surgeon: Angelia Mould, MD;  Location: [place] CV LAB;  Service: Cardiovascular;  Laterality: N/A;  . AMPUTATION Right 05/09/2017   Procedure: RIGHT TRANSMETATARSAL AMPUTATION;  Surgeon: [name], MD;  Location: [place];  Service: Orthopedics;  Laterality: Right;  . ARTERIOVENOUS GRAFT PLACEMENT Right right arm  . AV FISTULA PLACEMENT Left 08/31/2015   Procedure: ARTERIOVENOUS (AV) FISTULA CREATION- LEFT RADIOCEPHALIC;  Surgeon: Mal Misty, MD;  Location: [place];  Service: Vascular;  Laterality: Left;  . AV FISTULA PLACEMENT Right 02/18/2017   Procedure: INSERTION OF ARTERIOVENOUS (AV) GORE-TEX GRAFT  RIGHT UPPER ARM;  Surgeon: Conrad Montross, MD;  Location: [place];  Service: Vascular;  Laterality: Right;  . BASCILIC VEIN TRANSPOSITION Left 12/19/2015   Procedure: FIRST STAGE BRACHIAL VEIN TRANSPOSITION;  Surgeon: Conrad [place], MD;  Location: [place];  Service: Vascular;  Laterality: Left;  . BASCILIC VEIN TRANSPOSITION Left 02/08/2016   Procedure: SECOND STAGE BRACHIAL VEIN TRANSPOSITION;  Surgeon: Conrad Grace, MD;  Location: [place];  Service:  Vascular;  Laterality: Left;  . CARDIAC CATHETERIZATION N/A 07/11/2015   Procedure: Left Heart Cath and Coronary Angiography;  Surgeon: Troy Sine, MD;  Location: [place] [facility] LAB;  Service: Cardiovascular;  Laterality: N/A;  . Carpel Tunnel Left Dec. 22, 2016  . CHOLECYSTECTOMY    . COLONOSCOPY  2004   Dr. Irving Shows, left sided diverticula and cecal polyp, path unknown  . COLONOSCOPY  10/29/2011   Procedure: COLONOSCOPY;  Surgeon: Daneil Dolin, MD;  [facility];  Service: Endoscopy;  Laterality: N/A;  10:15  . ESOPHAGOGASTRODUODENOSCOPY  11/2002   Dr. Gala Romney, erosive reflux esophagitis, multiple gastric ulcer and antral/bulbar erosions. Serologies positive for H.Pylori and  was treated  . ESOPHAGOGASTRODUODENOSCOPY  11/20014   Dr. Gala Romney, small hh only, ulcers healed  . ESOPHAGOGASTRODUODENOSCOPY  09/21/2011   Dr Trevor Iha [facility], antral erosions, ?early GAVE  . FISTULOGRAM Left 12/10/2016   Procedure: THROMBECTOMY OF LEFT ARM ARTERIOVENOUS FISTULA;  Surgeon: Waynetta Sandy, MD;  Location: [place];  Service: Vascular;  Laterality: Left;  . INSERTION OF DIALYSIS CATHETER Left 12/10/2016   Procedure: INSERTION OF TUNNELED DIALYSIS CATHETER;  Surgeon: Waynetta Sandy, MD;  Location: [place];  Service: Vascular;  Laterality: Left;  . IR DIALY SHUNT INTRO [place] W/IMG RIGHT Right 01/01/2018  . IR GENERIC HISTORICAL  07/16/2016   IR REMOVAL TUN CV CATH W/O FL 07/16/2016 Saverio Danker, PA-C [facility]  . IR PTA ADDL CENTRAL DIALYSIS SEG THRU DIALY CIRCUIT RIGHT Right 10/21/2017  . IR REMOVAL TUN CV CATH W/O FL  05/12/2017  . IR THROMBECTOMY AV FISTULA W/THROMBOLYSIS/PTA INC/SHUNT/IMG RIGHT Right 10/21/2017  . IR THROMBECTOMY AV FISTULA W/THROMBOLYSIS/PTA INC/SHUNT/IMG RIGHT Right 11/27/2017  . IR US GUIDE VASC ACCESS RIGHT  10/21/2017  . IR US GUIDE VASC ACCESS RIGHT  11/27/2017  . IR US GUIDE VASC ACCESS RIGHT  01/01/2018  . LIGATION OF ARTERIOVENOUS  FISTULA  Left 12/19/2015   Procedure: LIGATION OF RADIOCEPHALIC ARTERIOVENOUS  FISTULA;  Surgeon: Conrad [place]ett, MD;  Location: [place];  Service: Vascular;  Laterality: Left;  Marland Kitchen MASS EXCISION Right 02/18/2017   Procedure: EXCISION OF RIGHT AXILLARY EPIDERMAL INCLUSION CYST;  Surgeon: Conrad Lyden, MD;  Location: [place];  Service: Vascular;  Laterality: Right;  . PERIPHERAL VASCULAR CATHETERIZATION N/A 12/14/2015   Procedure: Fistulagram;  Surgeon: Conrad Conners[place], MD;  Location: [place] CV LAB;  Service: Cardiovascular;  Laterality: N/A;  . SHOULDER SURGERY    . ULTRASOUND GUIDANCE FOR VASCULAR ACCESS  04/15/2017   Procedure: Ultrasound Guidance For Vascular Access;  Surgeon: Angelia Mould, MD;  Location: [place] [facility] LAB;  Service: Cardiovascular;;  . UPPER EXTREMITY VENOGRAPHY Bilateral 12/17/2016   Procedure: Bilateral Upper Extremity Venography;  Surgeon: Serafina Mitchell, MD;  Location: [place] [facility] LAB;  Service: Cardiovascular;  Laterality: Bilateral;   Social History   Occupational History  . Occupation: retired, Licensed conveyancer    Employer: RETIRED  Tobacco Use  . Smoking status: Former Smoker    Last attempt to quit: 04/29/2004    Years since quitting: 13.7  . Smokeless tobacco: Former Systems developer    Types: Chew    Quit date: 01/16/1987  . Tobacco comment: quit 2006  Substance and Sexual Activity  . Alcohol use: No  . Drug use: No  . Sexual activity: Not on file

## 2018-02-04 DIAGNOSIS — N2581 Secondary hyperparathyroidism of renal origin: Secondary | ICD-10-CM | POA: Diagnosis not present

## 2018-02-04 DIAGNOSIS — D631 Anemia in chronic kidney disease: Secondary | ICD-10-CM | POA: Diagnosis not present

## 2018-02-04 DIAGNOSIS — D509 Iron deficiency anemia, unspecified: Secondary | ICD-10-CM | POA: Diagnosis not present

## 2018-02-04 DIAGNOSIS — Z992 Dependence on renal dialysis: Secondary | ICD-10-CM | POA: Diagnosis not present

## 2018-02-04 DIAGNOSIS — N186 End stage renal disease: Secondary | ICD-10-CM | POA: Diagnosis not present

## 2018-02-06 DIAGNOSIS — D631 Anemia in chronic kidney disease: Secondary | ICD-10-CM | POA: Diagnosis not present

## 2018-02-06 DIAGNOSIS — Z992 Dependence on renal dialysis: Secondary | ICD-10-CM | POA: Diagnosis not present

## 2018-02-06 DIAGNOSIS — D509 Iron deficiency anemia, unspecified: Secondary | ICD-10-CM | POA: Diagnosis not present

## 2018-02-06 DIAGNOSIS — N186 End stage renal disease: Secondary | ICD-10-CM | POA: Diagnosis not present

## 2018-02-06 DIAGNOSIS — N2581 Secondary hyperparathyroidism of renal origin: Secondary | ICD-10-CM | POA: Diagnosis not present

## 2018-02-09 DIAGNOSIS — D631 Anemia in chronic kidney disease: Secondary | ICD-10-CM | POA: Diagnosis not present

## 2018-02-09 DIAGNOSIS — Z79899 Other long term (current) drug therapy: Secondary | ICD-10-CM | POA: Diagnosis not present

## 2018-02-09 DIAGNOSIS — Z992 Dependence on renal dialysis: Secondary | ICD-10-CM | POA: Diagnosis not present

## 2018-02-09 DIAGNOSIS — N2581 Secondary hyperparathyroidism of renal origin: Secondary | ICD-10-CM | POA: Diagnosis not present

## 2018-02-09 DIAGNOSIS — D509 Iron deficiency anemia, unspecified: Secondary | ICD-10-CM | POA: Diagnosis not present

## 2018-02-09 DIAGNOSIS — E785 Hyperlipidemia, unspecified: Secondary | ICD-10-CM | POA: Diagnosis not present

## 2018-02-09 DIAGNOSIS — N186 End stage renal disease: Secondary | ICD-10-CM | POA: Diagnosis not present

## 2018-02-11 DIAGNOSIS — N186 End stage renal disease: Secondary | ICD-10-CM | POA: Diagnosis not present

## 2018-02-11 DIAGNOSIS — D631 Anemia in chronic kidney disease: Secondary | ICD-10-CM | POA: Diagnosis not present

## 2018-02-11 DIAGNOSIS — Z992 Dependence on renal dialysis: Secondary | ICD-10-CM | POA: Diagnosis not present

## 2018-02-11 DIAGNOSIS — N2581 Secondary hyperparathyroidism of renal origin: Secondary | ICD-10-CM | POA: Diagnosis not present

## 2018-02-11 DIAGNOSIS — D509 Iron deficiency anemia, unspecified: Secondary | ICD-10-CM | POA: Diagnosis not present

## 2018-02-13 DIAGNOSIS — Z992 Dependence on renal dialysis: Secondary | ICD-10-CM | POA: Diagnosis not present

## 2018-02-13 DIAGNOSIS — D509 Iron deficiency anemia, unspecified: Secondary | ICD-10-CM | POA: Diagnosis not present

## 2018-02-13 DIAGNOSIS — N2581 Secondary hyperparathyroidism of renal origin: Secondary | ICD-10-CM | POA: Diagnosis not present

## 2018-02-13 DIAGNOSIS — N186 End stage renal disease: Secondary | ICD-10-CM | POA: Diagnosis not present

## 2018-02-13 DIAGNOSIS — D631 Anemia in chronic kidney disease: Secondary | ICD-10-CM | POA: Diagnosis not present

## 2018-02-16 DIAGNOSIS — N186 End stage renal disease: Secondary | ICD-10-CM | POA: Diagnosis not present

## 2018-02-16 DIAGNOSIS — N2581 Secondary hyperparathyroidism of renal origin: Secondary | ICD-10-CM | POA: Diagnosis not present

## 2018-02-16 DIAGNOSIS — D509 Iron deficiency anemia, unspecified: Secondary | ICD-10-CM | POA: Diagnosis not present

## 2018-02-16 DIAGNOSIS — D631 Anemia in chronic kidney disease: Secondary | ICD-10-CM | POA: Diagnosis not present

## 2018-02-16 DIAGNOSIS — Z992 Dependence on renal dialysis: Secondary | ICD-10-CM | POA: Diagnosis not present

## 2018-02-17 DIAGNOSIS — I739 Peripheral vascular disease, unspecified: Secondary | ICD-10-CM | POA: Diagnosis not present

## 2018-02-17 DIAGNOSIS — L97529 Non-pressure chronic ulcer of other part of left foot with unspecified severity: Secondary | ICD-10-CM | POA: Diagnosis not present

## 2018-02-17 DIAGNOSIS — E1142 Type 2 diabetes mellitus with diabetic polyneuropathy: Secondary | ICD-10-CM | POA: Diagnosis not present

## 2018-02-18 DIAGNOSIS — N186 End stage renal disease: Secondary | ICD-10-CM | POA: Diagnosis not present

## 2018-02-18 DIAGNOSIS — D509 Iron deficiency anemia, unspecified: Secondary | ICD-10-CM | POA: Diagnosis not present

## 2018-02-18 DIAGNOSIS — Z992 Dependence on renal dialysis: Secondary | ICD-10-CM | POA: Diagnosis not present

## 2018-02-18 DIAGNOSIS — D631 Anemia in chronic kidney disease: Secondary | ICD-10-CM | POA: Diagnosis not present

## 2018-02-18 DIAGNOSIS — N2581 Secondary hyperparathyroidism of renal origin: Secondary | ICD-10-CM | POA: Diagnosis not present

## 2018-02-19 DIAGNOSIS — D631 Anemia in chronic kidney disease: Secondary | ICD-10-CM | POA: Diagnosis not present

## 2018-02-19 DIAGNOSIS — Z992 Dependence on renal dialysis: Secondary | ICD-10-CM | POA: Diagnosis not present

## 2018-02-19 DIAGNOSIS — N186 End stage renal disease: Secondary | ICD-10-CM | POA: Diagnosis not present

## 2018-02-19 DIAGNOSIS — N2581 Secondary hyperparathyroidism of renal origin: Secondary | ICD-10-CM | POA: Diagnosis not present

## 2018-02-19 DIAGNOSIS — D509 Iron deficiency anemia, unspecified: Secondary | ICD-10-CM | POA: Diagnosis not present

## 2018-02-20 DIAGNOSIS — N2581 Secondary hyperparathyroidism of renal origin: Secondary | ICD-10-CM | POA: Diagnosis not present

## 2018-02-20 DIAGNOSIS — N186 End stage renal disease: Secondary | ICD-10-CM | POA: Diagnosis not present

## 2018-02-20 DIAGNOSIS — D509 Iron deficiency anemia, unspecified: Secondary | ICD-10-CM | POA: Diagnosis not present

## 2018-02-20 DIAGNOSIS — Z992 Dependence on renal dialysis: Secondary | ICD-10-CM | POA: Diagnosis not present

## 2018-02-20 DIAGNOSIS — D631 Anemia in chronic kidney disease: Secondary | ICD-10-CM | POA: Diagnosis not present

## 2018-02-23 DIAGNOSIS — D631 Anemia in chronic kidney disease: Secondary | ICD-10-CM | POA: Diagnosis not present

## 2018-02-23 DIAGNOSIS — N2581 Secondary hyperparathyroidism of renal origin: Secondary | ICD-10-CM | POA: Diagnosis not present

## 2018-02-23 DIAGNOSIS — D509 Iron deficiency anemia, unspecified: Secondary | ICD-10-CM | POA: Diagnosis not present

## 2018-02-23 DIAGNOSIS — N186 End stage renal disease: Secondary | ICD-10-CM | POA: Diagnosis not present

## 2018-02-23 DIAGNOSIS — Z992 Dependence on renal dialysis: Secondary | ICD-10-CM | POA: Diagnosis not present

## 2018-02-25 DIAGNOSIS — N186 End stage renal disease: Secondary | ICD-10-CM | POA: Diagnosis not present

## 2018-02-25 DIAGNOSIS — D631 Anemia in chronic kidney disease: Secondary | ICD-10-CM | POA: Diagnosis not present

## 2018-02-25 DIAGNOSIS — D509 Iron deficiency anemia, unspecified: Secondary | ICD-10-CM | POA: Diagnosis not present

## 2018-02-25 DIAGNOSIS — N2581 Secondary hyperparathyroidism of renal origin: Secondary | ICD-10-CM | POA: Diagnosis not present

## 2018-02-25 DIAGNOSIS — Z992 Dependence on renal dialysis: Secondary | ICD-10-CM | POA: Diagnosis not present

## 2018-02-26 DIAGNOSIS — Z992 Dependence on renal dialysis: Secondary | ICD-10-CM | POA: Diagnosis not present

## 2018-02-26 DIAGNOSIS — N186 End stage renal disease: Secondary | ICD-10-CM | POA: Diagnosis not present

## 2018-02-27 DIAGNOSIS — Z992 Dependence on renal dialysis: Secondary | ICD-10-CM | POA: Diagnosis not present

## 2018-02-27 DIAGNOSIS — D509 Iron deficiency anemia, unspecified: Secondary | ICD-10-CM | POA: Diagnosis not present

## 2018-02-27 DIAGNOSIS — N186 End stage renal disease: Secondary | ICD-10-CM | POA: Diagnosis not present

## 2018-02-27 DIAGNOSIS — N2581 Secondary hyperparathyroidism of renal origin: Secondary | ICD-10-CM | POA: Diagnosis not present

## 2018-03-02 DIAGNOSIS — N186 End stage renal disease: Secondary | ICD-10-CM | POA: Diagnosis not present

## 2018-03-02 DIAGNOSIS — Z992 Dependence on renal dialysis: Secondary | ICD-10-CM | POA: Diagnosis not present

## 2018-03-02 DIAGNOSIS — D509 Iron deficiency anemia, unspecified: Secondary | ICD-10-CM | POA: Diagnosis not present

## 2018-03-02 DIAGNOSIS — N2581 Secondary hyperparathyroidism of renal origin: Secondary | ICD-10-CM | POA: Diagnosis not present

## 2018-03-04 DIAGNOSIS — D509 Iron deficiency anemia, unspecified: Secondary | ICD-10-CM | POA: Diagnosis not present

## 2018-03-04 DIAGNOSIS — N2581 Secondary hyperparathyroidism of renal origin: Secondary | ICD-10-CM | POA: Diagnosis not present

## 2018-03-04 DIAGNOSIS — Z992 Dependence on renal dialysis: Secondary | ICD-10-CM | POA: Diagnosis not present

## 2018-03-04 DIAGNOSIS — N186 End stage renal disease: Secondary | ICD-10-CM | POA: Diagnosis not present

## 2018-03-06 DIAGNOSIS — D509 Iron deficiency anemia, unspecified: Secondary | ICD-10-CM | POA: Diagnosis not present

## 2018-03-06 DIAGNOSIS — N2581 Secondary hyperparathyroidism of renal origin: Secondary | ICD-10-CM | POA: Diagnosis not present

## 2018-03-06 DIAGNOSIS — Z992 Dependence on renal dialysis: Secondary | ICD-10-CM | POA: Diagnosis not present

## 2018-03-06 DIAGNOSIS — N186 End stage renal disease: Secondary | ICD-10-CM | POA: Diagnosis not present

## 2018-03-09 DIAGNOSIS — D509 Iron deficiency anemia, unspecified: Secondary | ICD-10-CM | POA: Diagnosis not present

## 2018-03-09 DIAGNOSIS — Z992 Dependence on renal dialysis: Secondary | ICD-10-CM | POA: Diagnosis not present

## 2018-03-09 DIAGNOSIS — N2581 Secondary hyperparathyroidism of renal origin: Secondary | ICD-10-CM | POA: Diagnosis not present

## 2018-03-09 DIAGNOSIS — N186 End stage renal disease: Secondary | ICD-10-CM | POA: Diagnosis not present

## 2018-03-10 ENCOUNTER — Other Ambulatory Visit (INDEPENDENT_AMBULATORY_CARE_PROVIDER_SITE_OTHER): Payer: Self-pay

## 2018-03-10 ENCOUNTER — Ambulatory Visit (INDEPENDENT_AMBULATORY_CARE_PROVIDER_SITE_OTHER): Payer: Medicare Other

## 2018-03-10 ENCOUNTER — Encounter (INDEPENDENT_AMBULATORY_CARE_PROVIDER_SITE_OTHER): Payer: Self-pay | Admitting: Orthopedic Surgery

## 2018-03-10 ENCOUNTER — Ambulatory Visit (INDEPENDENT_AMBULATORY_CARE_PROVIDER_SITE_OTHER): Payer: Medicare Other | Admitting: Physician Assistant

## 2018-03-10 DIAGNOSIS — L97411 Non-pressure chronic ulcer of right heel and midfoot limited to breakdown of skin: Secondary | ICD-10-CM

## 2018-03-10 DIAGNOSIS — Z89431 Acquired absence of right foot: Secondary | ICD-10-CM | POA: Diagnosis not present

## 2018-03-10 DIAGNOSIS — I739 Peripheral vascular disease, unspecified: Secondary | ICD-10-CM | POA: Diagnosis not present

## 2018-03-10 MED ORDER — MUPIROCIN 2 % EX OINT: 1.0000 "application " | TOPICAL_OINTMENT | Freq: Every day | CUTANEOUS | 6 refills | Status: DC

## 2018-03-11 ENCOUNTER — Encounter (INDEPENDENT_AMBULATORY_CARE_PROVIDER_SITE_OTHER): Payer: Self-pay | Admitting: Physician Assistant

## 2018-03-11 DIAGNOSIS — N186 End stage renal disease: Secondary | ICD-10-CM | POA: Diagnosis not present

## 2018-03-11 DIAGNOSIS — Z992 Dependence on renal dialysis: Secondary | ICD-10-CM | POA: Diagnosis not present

## 2018-03-11 DIAGNOSIS — D509 Iron deficiency anemia, unspecified: Secondary | ICD-10-CM | POA: Diagnosis not present

## 2018-03-11 DIAGNOSIS — N2581 Secondary hyperparathyroidism of renal origin: Secondary | ICD-10-CM | POA: Diagnosis not present

## 2018-03-11 NOTE — Progress Notes (Signed)
Office Visit Note   Patient: Joseph BOEHLE Sr.           Date of Birth: Aug 28, 1940           MRN: [id number] Visit Date: 03/10/2018              Requested by: [name], MD [address] [place], [place] [address] PCP: [name], MD  Chief Complaint  Patient presents with  . Right Foot - Wound Check      HPI: The patient is a 77 yo male who is seen for follow up of right foot plantar ulcers. He is S/P transmetatarsal amputation in Jan 2019. He is using orthotics in his shoes and trying not to weight bear over the right foot as much as possible. He reports some increased callus and drainage over the areas. He has been using silvadene cream to the areas. He was using some NTG patches to the right foot but does not have one on today.   Assessment & Plan: Visit Diagnoses:  1. Midfoot skin ulcer, right, limited to breakdown of skin ([place])   2. S/P transmetatarsal amputation of foot, right ([place])   3. PVD (peripheral vascular disease) ([place])     Plan: Concern for early osteomyelitis about the 1st MT head on xray. Will begin Bactroban to the areas daily after cleaning the foot with Dial soap and water. Counseled to try and stay off the right foot as much as possible and continue Glucerna Protein supplements at least daily.Follow up in 3 weeks or sooner. Follow up in 3 weeks or sooner if worsening in the interim.   Follow-Up Instructions: Return in about 3 weeks (around 03/31/2018).   Ortho Exam  Patient is alert, oriented, no adenopathy, well-dressed, normal affect, normal respiratory effort. After informed consent the right plantar surface foot ulcers were debrided with a #10 blade knife of callus and soft tissue and the patient tolerated this well. The ulcers are quarter size and have about 1 cm of depth. No signs of cellulitis or infection over the area.   Imaging: No results found. No images are attached to the encounter.  Labs: Lab Results  Component Value Date     REPTSTATUS 02/22/2017 FINAL 02/18/2017   GRAMSTAIN  02/18/2017    RARE WBC PRESENT, PREDOMINANTLY PMN FEW GRAM POSITIVE COCCI    CULT  02/18/2017    NO GROWTH AEROBICALLY MIXED ANAEROBIC FLORA PRESENT.  CALL LAB IF FURTHER IID REQUIRED.      Lab Results  Component Value Date   ALBUMIN 2.9 (L) 05/12/2017   ALBUMIN 3.0 (L) 05/10/2017   ALBUMIN 3.3 (L) 03/31/2017    There is no height or weight on file to calculate BMI.  Orders:  Orders Placed This Encounter  Procedures  . XR Foot 2 Views Right   Meds ordered this encounter  Medications  . DISCONTD: mupirocin ointment (BACTROBAN) 2 %    Sig: Apply 1 application topically daily.    Dispense:  22 g    Refill:  6  . mupirocin ointment (BACTROBAN) 2 %    Sig: Apply 1 application topically daily.    Dispense:  22 g    Refill:  6     Procedures: No procedures performed  Clinical Data: No additional findings.  ROS:  All other systems negative, except as noted in the HPI. Review of Systems  Objective: Vital Signs: There were no vitals taken for this visit.  Specialty Comments:  No specialty comments  available.  PMFS History: Patient Active Problem List   Diagnosis Date Noted  . S/P transmetatarsal amputation of foot, right ([place]) 05/09/2017  . PVD (peripheral vascular disease) ([place]) 04/15/2017  . Nonischemic cardiomyopathy ([place]) 08/15/2015  . Aortic stenosis 08/15/2015  . Moderate aortic stenosis 07/12/2015  . 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 CAD- 40-50% LAD at cath 07/11/15 07/11/2015  . Aspirin intolerance 07/11/2015  . Abnormal stress test   . ESRD on dialysis ([place]) 05/30/2015  . CTS (carpal tunnel syndrome) 02/28/2015  . PVD of LE - Dr Oneida Alar follows 08/20/2012  . Midfoot skin ulcer, right, limited to breakdown of skin ([place]) 01/16/2012  . Encounter for screening colonoscopy 10/08/2011  . Melena 10/08/2011  . Other complications due to renal dialysis device, implant, and graft  09/24/2011   Past Medical History:  Diagnosis Date  . Anxiety   . Aortic stenosis   . Arthritis   . CVA (cerebral infarction)   . ESRD (end stage renal disease) on dialysis [facility])    M/W/F at [facility] in [place]  . Essential hypertension   . Gangrene of right foot ([place])   . Gastric ulcer 2004  . History of cardiomyopathy    LVEF normal as of February 2017  . History of stroke    Left side weakness  . Iron deficiency anemia     Family History  Problem Relation Age of Onset  . Hypertension Mother   . Colon cancer Neg Hx   . Liver disease Neg Hx     Past Surgical History:  Procedure Laterality Date  . ABDOMINAL AORTAGRAM N/A 01/24/2012   Procedure: ABDOMINAL Maxcine Ham;  Surgeon: Elam Dutch, MD;  Location: [facility] CATH LAB;  Service: Cardiovascular;  Laterality: N/A;  . ABDOMINAL AORTOGRAM W/LOWER EXTREMITY N/A 04/15/2017   Procedure: ABDOMINAL AORTOGRAM W/LOWER EXTREMITY;  Surgeon: Angelia Mould, MD;  Location: [place] CV LAB;  Service: Cardiovascular;  Laterality: N/A;  . AMPUTATION Right 05/09/2017   Procedure: RIGHT TRANSMETATARSAL AMPUTATION;  Surgeon: [name], MD;  Location: [place];  Service: Orthopedics;  Laterality: Right;  . ARTERIOVENOUS GRAFT PLACEMENT Right right arm  . AV FISTULA PLACEMENT Left 08/31/2015   Procedure: ARTERIOVENOUS (AV) FISTULA CREATION- LEFT RADIOCEPHALIC;  Surgeon: Mal Misty, MD;  Location: [place];  Service: Vascular;  Laterality: Left;  . AV FISTULA PLACEMENT Right 02/18/2017   Procedure: INSERTION OF ARTERIOVENOUS (AV) GORE-TEX GRAFT  RIGHT UPPER ARM;  Surgeon: Conrad Wineglass, MD;  Location: [place];  Service: Vascular;  Laterality: Right;  . BASCILIC VEIN TRANSPOSITION Left 12/19/2015   Procedure: FIRST STAGE BRACHIAL VEIN TRANSPOSITION;  Surgeon: Conrad Covelo, MD;  Location: [place];  Service: Vascular;  Laterality: Left;  . BASCILIC VEIN TRANSPOSITION Left 02/08/2016   Procedure: SECOND STAGE BRACHIAL VEIN TRANSPOSITION;   Surgeon: Conrad , MD;  Location: [place];  Service: Vascular;  Laterality: Left;  . CARDIAC CATHETERIZATION N/A 07/11/2015   Procedure: Left Heart Cath and Coronary Angiography;  Surgeon: Troy Sine, MD;  Location: [place] CV LAB;  Service: Cardiovascular;  Laterality: N/A;  . Carpel Tunnel Left Dec. 22, 2016  . CHOLECYSTECTOMY    . COLONOSCOPY  2004   Dr. Irving Shows, left sided diverticula and cecal polyp, path unknown  . COLONOSCOPY  10/29/2011   Procedure: COLONOSCOPY;  Surgeon: Daneil Dolin, MD;  [facility];  Service: Endoscopy;  Laterality: N/A;  10:15  . ESOPHAGOGASTRODUODENOSCOPY  11/2002   Dr. Gala Romney,  erosive reflux esophagitis, multiple gastric ulcer and antral/bulbar erosions. Serologies positive for H.Pylori and was treated  . ESOPHAGOGASTRODUODENOSCOPY  11/20014   Dr. Gala Romney, small hh only, ulcers healed  . ESOPHAGOGASTRODUODENOSCOPY  09/21/2011   Dr Trevor Iha HH, antral erosions, ?early GAVE  . FISTULOGRAM Left 12/10/2016   Procedure: THROMBECTOMY OF LEFT ARM ARTERIOVENOUS FISTULA;  Surgeon: Waynetta Sandy, MD;  Location: [place];  Service: Vascular;  Laterality: Left;  . INSERTION OF DIALYSIS CATHETER Left 12/10/2016   Procedure: INSERTION OF TUNNELED DIALYSIS CATHETER;  Surgeon: Waynetta Sandy, MD;  Location: [place];  Service: Vascular;  Laterality: Left;  . IR DIALY SHUNT INTRO [place] W/IMG RIGHT Right 01/01/2018  . IR GENERIC HISTORICAL  07/16/2016   IR REMOVAL TUN CV CATH W/O FL 07/16/2016 Saverio Danker, PA-C [facility]  . IR PTA ADDL CENTRAL DIALYSIS SEG THRU DIALY CIRCUIT RIGHT Right 10/21/2017  . IR REMOVAL TUN CV CATH W/O FL  05/12/2017  . IR THROMBECTOMY AV FISTULA W/THROMBOLYSIS/PTA INC/SHUNT/IMG RIGHT Right 10/21/2017  . IR THROMBECTOMY AV FISTULA W/THROMBOLYSIS/PTA INC/SHUNT/IMG RIGHT Right 11/27/2017  . IR US GUIDE VASC ACCESS RIGHT  10/21/2017  . IR US GUIDE VASC ACCESS RIGHT  11/27/2017  . IR US GUIDE VASC ACCESS  RIGHT  01/01/2018  . LIGATION OF ARTERIOVENOUS  FISTULA Left 12/19/2015   Procedure: LIGATION OF RADIOCEPHALIC ARTERIOVENOUS  FISTULA;  Surgeon: Conrad Junior, MD;  Location: [place];  Service: Vascular;  Laterality: Left;  Marland Kitchen MASS EXCISION Right 02/18/2017   Procedure: EXCISION OF RIGHT AXILLARY EPIDERMAL INCLUSION CYST;  Surgeon: Conrad Romney, MD;  Location: [place];  Service: Vascular;  Laterality: Right;  . PERIPHERAL VASCULAR CATHETERIZATION N/A 12/14/2015   Procedure: Fistulagram;  Surgeon: Conrad Reserve, MD;  Location: [place] CV LAB;  Service: Cardiovascular;  Laterality: N/A;  . SHOULDER SURGERY    . ULTRASOUND GUIDANCE FOR VASCULAR ACCESS  04/15/2017   Procedure: Ultrasound Guidance For Vascular Access;  Surgeon: Angelia Mould, MD;  Location: [facility] CV LAB;  Service: Cardiovascular;;  . UPPER EXTREMITY VENOGRAPHY Bilateral 12/17/2016   Procedure: Bilateral Upper Extremity Venography;  Surgeon: Serafina Mitchell, MD;  Location: [place] CV LAB;  Service: Cardiovascular;  Laterality: Bilateral;   Social History   Occupational History  . Occupation: retired, Licensed conveyancer    Employer: RETIRED  Tobacco Use  . Smoking status: Former Smoker    Last attempt to quit: 04/29/2004    Years since quitting: 13.8  . Smokeless tobacco: Former Systems developer    Types: Chew    Quit date: 01/16/1987  . Tobacco comment: quit 2006  Substance and Sexual Activity  . Alcohol use: No  . Drug use: No  . Sexual activity: Not on file

## 2018-03-13 DIAGNOSIS — Z992 Dependence on renal dialysis: Secondary | ICD-10-CM | POA: Diagnosis not present

## 2018-03-13 DIAGNOSIS — N186 End stage renal disease: Secondary | ICD-10-CM | POA: Diagnosis not present

## 2018-03-13 DIAGNOSIS — N2581 Secondary hyperparathyroidism of renal origin: Secondary | ICD-10-CM | POA: Diagnosis not present

## 2018-03-13 DIAGNOSIS — D509 Iron deficiency anemia, unspecified: Secondary | ICD-10-CM | POA: Diagnosis not present

## 2018-03-16 DIAGNOSIS — N2581 Secondary hyperparathyroidism of renal origin: Secondary | ICD-10-CM | POA: Diagnosis not present

## 2018-03-16 DIAGNOSIS — Z992 Dependence on renal dialysis: Secondary | ICD-10-CM | POA: Diagnosis not present

## 2018-03-16 DIAGNOSIS — D509 Iron deficiency anemia, unspecified: Secondary | ICD-10-CM | POA: Diagnosis not present

## 2018-03-16 DIAGNOSIS — N186 End stage renal disease: Secondary | ICD-10-CM | POA: Diagnosis not present

## 2018-03-18 DIAGNOSIS — N186 End stage renal disease: Secondary | ICD-10-CM | POA: Diagnosis not present

## 2018-03-18 DIAGNOSIS — Z992 Dependence on renal dialysis: Secondary | ICD-10-CM | POA: Diagnosis not present

## 2018-03-18 DIAGNOSIS — N2581 Secondary hyperparathyroidism of renal origin: Secondary | ICD-10-CM | POA: Diagnosis not present

## 2018-03-18 DIAGNOSIS — D509 Iron deficiency anemia, unspecified: Secondary | ICD-10-CM | POA: Diagnosis not present

## 2018-03-19 ENCOUNTER — Other Ambulatory Visit: Payer: Self-pay

## 2018-03-20 DIAGNOSIS — D509 Iron deficiency anemia, unspecified: Secondary | ICD-10-CM | POA: Diagnosis not present

## 2018-03-20 DIAGNOSIS — N186 End stage renal disease: Secondary | ICD-10-CM | POA: Diagnosis not present

## 2018-03-20 DIAGNOSIS — N2581 Secondary hyperparathyroidism of renal origin: Secondary | ICD-10-CM | POA: Diagnosis not present

## 2018-03-20 DIAGNOSIS — Z992 Dependence on renal dialysis: Secondary | ICD-10-CM | POA: Diagnosis not present

## 2018-03-20 DIAGNOSIS — L97522 Non-pressure chronic ulcer of other part of left foot with fat layer exposed: Secondary | ICD-10-CM | POA: Diagnosis not present

## 2018-03-21 DIAGNOSIS — D509 Iron deficiency anemia, unspecified: Secondary | ICD-10-CM | POA: Diagnosis not present

## 2018-03-21 DIAGNOSIS — Z992 Dependence on renal dialysis: Secondary | ICD-10-CM | POA: Diagnosis not present

## 2018-03-21 DIAGNOSIS — N2581 Secondary hyperparathyroidism of renal origin: Secondary | ICD-10-CM | POA: Diagnosis not present

## 2018-03-21 DIAGNOSIS — N186 End stage renal disease: Secondary | ICD-10-CM | POA: Diagnosis not present

## 2018-03-23 DIAGNOSIS — D509 Iron deficiency anemia, unspecified: Secondary | ICD-10-CM | POA: Diagnosis not present

## 2018-03-23 DIAGNOSIS — Z992 Dependence on renal dialysis: Secondary | ICD-10-CM | POA: Diagnosis not present

## 2018-03-23 DIAGNOSIS — N2581 Secondary hyperparathyroidism of renal origin: Secondary | ICD-10-CM | POA: Diagnosis not present

## 2018-03-23 DIAGNOSIS — N186 End stage renal disease: Secondary | ICD-10-CM | POA: Diagnosis not present

## 2018-03-25 DIAGNOSIS — D509 Iron deficiency anemia, unspecified: Secondary | ICD-10-CM | POA: Diagnosis not present

## 2018-03-25 DIAGNOSIS — N2581 Secondary hyperparathyroidism of renal origin: Secondary | ICD-10-CM | POA: Diagnosis not present

## 2018-03-25 DIAGNOSIS — Z992 Dependence on renal dialysis: Secondary | ICD-10-CM | POA: Diagnosis not present

## 2018-03-25 DIAGNOSIS — N186 End stage renal disease: Secondary | ICD-10-CM | POA: Diagnosis not present

## 2018-03-27 DIAGNOSIS — N2581 Secondary hyperparathyroidism of renal origin: Secondary | ICD-10-CM | POA: Diagnosis not present

## 2018-03-27 DIAGNOSIS — N186 End stage renal disease: Secondary | ICD-10-CM | POA: Diagnosis not present

## 2018-03-27 DIAGNOSIS — D509 Iron deficiency anemia, unspecified: Secondary | ICD-10-CM | POA: Diagnosis not present

## 2018-03-27 DIAGNOSIS — Z992 Dependence on renal dialysis: Secondary | ICD-10-CM | POA: Diagnosis not present

## 2018-03-28 DIAGNOSIS — N186 End stage renal disease: Secondary | ICD-10-CM | POA: Diagnosis not present

## 2018-03-28 DIAGNOSIS — Z992 Dependence on renal dialysis: Secondary | ICD-10-CM | POA: Diagnosis not present

## 2018-03-29 DIAGNOSIS — Z992 Dependence on renal dialysis: Secondary | ICD-10-CM | POA: Diagnosis not present

## 2018-03-29 DIAGNOSIS — N186 End stage renal disease: Secondary | ICD-10-CM | POA: Diagnosis not present

## 2018-03-29 DIAGNOSIS — N2581 Secondary hyperparathyroidism of renal origin: Secondary | ICD-10-CM | POA: Diagnosis not present

## 2018-03-29 DIAGNOSIS — D509 Iron deficiency anemia, unspecified: Secondary | ICD-10-CM | POA: Diagnosis not present

## 2018-03-30 DIAGNOSIS — D509 Iron deficiency anemia, unspecified: Secondary | ICD-10-CM | POA: Diagnosis not present

## 2018-03-30 DIAGNOSIS — N186 End stage renal disease: Secondary | ICD-10-CM | POA: Diagnosis not present

## 2018-03-30 DIAGNOSIS — Z992 Dependence on renal dialysis: Secondary | ICD-10-CM | POA: Diagnosis not present

## 2018-03-30 DIAGNOSIS — N2581 Secondary hyperparathyroidism of renal origin: Secondary | ICD-10-CM | POA: Diagnosis not present

## 2018-03-31 ENCOUNTER — Encounter (INDEPENDENT_AMBULATORY_CARE_PROVIDER_SITE_OTHER): Payer: Self-pay | Admitting: Orthopedic Surgery

## 2018-03-31 ENCOUNTER — Ambulatory Visit (INDEPENDENT_AMBULATORY_CARE_PROVIDER_SITE_OTHER): Payer: Medicare Other | Admitting: Orthopedic Surgery

## 2018-03-31 VITALS — Ht 65.0 in | Wt 170.0 lb

## 2018-03-31 DIAGNOSIS — Z89431 Acquired absence of right foot: Secondary | ICD-10-CM

## 2018-03-31 DIAGNOSIS — L97411 Non-pressure chronic ulcer of right heel and midfoot limited to breakdown of skin: Secondary | ICD-10-CM

## 2018-03-31 NOTE — Progress Notes (Signed)
Office Visit Note   Patient: Joseph FICHERA Sr.           Date of Birth: May 16, 1940           MRN: [id number] Visit Date: 03/31/2018              Requested by: [name], MD [address] [place], [place] [address] PCP: [name], MD  Chief Complaint  Patient presents with  . Right Foot - Follow-up    Right foot transmet  Ulcer tx w/bactroban      HPI: Patient is a 77 year old gentleman who presents in follow-up status post right transmetatarsal amputation with plantar ulcer.  Patient states he feels well has no problems no complaints he feels like he is making good progress.  Assessment & Plan: Visit Diagnoses:  1. Midfoot skin ulcer, right, limited to breakdown of skin ([place])   2. S/P transmetatarsal amputation of foot, right ([place])     Plan: We will follow-up in 4 weeks.  Discussed that with the slight lytic changes on the distal aspect of the first metatarsal I am concerned that there may be an underlying bone infection that is keeping this wound from healing.  Will reevaluate at follow-up Iodosorb and 4 x 4's applied.  Follow-Up Instructions: Return in about 4 weeks (around 04/28/2018).   Ortho Exam  Patient is alert, oriented, no adenopathy, well-dressed, normal affect, normal respiratory effort. Examination patient's radiographs were reviewed again he does have some lytic changes at the distal aspect of the first metatarsal there is severe peripheral vascular disease with calcification of the vessels out to the amputation site.  There is a wedge of bone between the first and fourth metatarsals.  Patient has a large Waggoner grade 1 ulcer.  After informed consent a 10 blade knife was used to debride the skin and soft tissue back to healthy viable granulation tissue this did not probe to bone or tendon.  The ulcer is 6 cm x 3 cm and 5 mm deep this was touched with silver nitrate Iodosorb dressing was applied.  Imaging: No results found. No images are attached  to the encounter.  Labs: Lab Results  Component Value Date   REPTSTATUS 02/22/2017 FINAL 02/18/2017   GRAMSTAIN  02/18/2017    RARE WBC PRESENT, PREDOMINANTLY PMN FEW GRAM POSITIVE COCCI    CULT  02/18/2017    NO GROWTH AEROBICALLY MIXED ANAEROBIC FLORA PRESENT.  CALL LAB IF FURTHER IID REQUIRED.      Lab Results  Component Value Date   ALBUMIN 2.9 (L) 05/12/2017   ALBUMIN 3.0 (L) 05/10/2017   ALBUMIN 3.3 (L) 03/31/2017    Body mass index is 28.29 kg/m.  Orders:  No orders of the defined types were placed in this encounter.  No orders of the defined types were placed in this encounter.    Procedures: No procedures performed  Clinical Data: No additional findings.  ROS:  All other systems negative, except as noted in the HPI. Review of Systems  Objective: Vital Signs: Ht 5\' 5"  (1.651 m)   Wt 170 lb (77.1 kg)   BMI 28.29 kg/m   Specialty Comments:  No specialty comments available.  PMFS History: Patient Active Problem List   Diagnosis Date Noted  . S/P transmetatarsal amputation of foot, right ([place]) 05/09/2017  . PVD (peripheral vascular disease) ([place]) 04/15/2017  . Nonischemic cardiomyopathy ([place]) 08/15/2015  . Aortic stenosis 08/15/2015  . Moderate aortic stenosis 07/12/2015  . Non-ischemic cardiomyopathy- EF 35-  45% 07/11/2015  . CAD- 40-50% LAD at cath 07/11/15 07/11/2015  . Aspirin intolerance 07/11/2015  . Abnormal stress test   . ESRD on dialysis ([place]) 05/30/2015  . CTS (carpal tunnel syndrome) 02/28/2015  . PVD of LE - Dr Oneida Alar follows 08/20/2012  . Midfoot skin ulcer, right, limited to breakdown of skin ([place]) 01/16/2012  . Encounter for screening colonoscopy 10/08/2011  . Melena 10/08/2011  . Other complications due to renal dialysis device, implant, and graft 09/24/2011   Past Medical History:  Diagnosis Date  . Anxiety   . Aortic stenosis   . Arthritis   . CVA (cerebral infarction)   . ESRD (end stage renal disease) on dialysis  [facility])    M/W/F at [facility] in [place]  . Essential hypertension   . Gangrene of right foot ([place])   . Gastric ulcer 2004  . History of cardiomyopathy    LVEF normal as of February 2017  . History of stroke    Left side weakness  . Iron deficiency anemia     Family History  Problem Relation Age of Onset  . Hypertension Mother   . Colon cancer Neg Hx   . Liver disease Neg Hx     Past Surgical History:  Procedure Laterality Date  . ABDOMINAL AORTAGRAM N/A 01/24/2012   Procedure: ABDOMINAL Maxcine Ham;  Surgeon: Elam Dutch, MD;  Location: [facility] CATH LAB;  Service: Cardiovascular;  Laterality: N/A;  . ABDOMINAL AORTOGRAM W/LOWER EXTREMITY N/A 04/15/2017   Procedure: ABDOMINAL AORTOGRAM W/LOWER EXTREMITY;  Surgeon: Angelia Mould, MD;  Location: [place] CV LAB;  Service: Cardiovascular;  Laterality: N/A;  . AMPUTATION Right 05/09/2017   Procedure: RIGHT TRANSMETATARSAL AMPUTATION;  Surgeon: [name], MD;  Location: [place];  Service: Orthopedics;  Laterality: Right;  . ARTERIOVENOUS GRAFT PLACEMENT Right right arm  . AV FISTULA PLACEMENT Left 08/31/2015   Procedure: ARTERIOVENOUS (AV) FISTULA CREATION- LEFT RADIOCEPHALIC;  Surgeon: Mal Misty, MD;  Location: [place];  Service: Vascular;  Laterality: Left;  . AV FISTULA PLACEMENT Right 02/18/2017   Procedure: INSERTION OF ARTERIOVENOUS (AV) GORE-TEX GRAFT  RIGHT UPPER ARM;  Surgeon: Conrad [place], MD;  Location: [place];  Service: Vascular;  Laterality: Right;  . BASCILIC VEIN TRANSPOSITION Left 12/19/2015   Procedure: FIRST STAGE BRACHIAL VEIN TRANSPOSITION;  Surgeon: Conrad Colp, MD;  Location: [place];  Service: Vascular;  Laterality: Left;  . BASCILIC VEIN TRANSPOSITION Left 02/08/2016   Procedure: SECOND STAGE BRACHIAL VEIN TRANSPOSITION;  Surgeon: Conrad [place], MD;  Location: [place];  Service: Vascular;  Laterality: Left;  . CARDIAC CATHETERIZATION N/A 07/11/2015   Procedure: Left Heart Cath and Coronary Angiography;   Surgeon: Troy Sine, MD;  Location: [place] CV LAB;  Service: Cardiovascular;  Laterality: N/A;  . Carpel Tunnel Left Dec. 22, 2016  . CHOLECYSTECTOMY    . COLONOSCOPY  2004   Dr. Irving Shows, left sided diverticula and cecal polyp, path unknown  . COLONOSCOPY  10/29/2011   Procedure: COLONOSCOPY;  Surgeon: Daneil Dolin, MD;  [facility];  Service: Endoscopy;  Laterality: N/A;  10:15  . ESOPHAGOGASTRODUODENOSCOPY  11/2002   Dr. Gala Romney, erosive reflux esophagitis, multiple gastric ulcer and antral/bulbar erosions. Serologies positive for H.Pylori and was treated  . ESOPHAGOGASTRODUODENOSCOPY  11/20014   Dr. Gala Romney, small hh only, ulcers healed  . ESOPHAGOGASTRODUODENOSCOPY  09/21/2011   Dr Trevor Iha HH, antral erosions, ?early GAVE  . FISTULOGRAM Left 12/10/2016   Procedure: THROMBECTOMY OF  LEFT ARM ARTERIOVENOUS FISTULA;  Surgeon: Waynetta Sandy, MD;  Location: [place];  Service: Vascular;  Laterality: Left;  . INSERTION OF DIALYSIS CATHETER Left 12/10/2016   Procedure: INSERTION OF TUNNELED DIALYSIS CATHETER;  Surgeon: Waynetta Sandy, MD;  Location: [place];  Service: Vascular;  Laterality: Left;  . IR DIALY SHUNT INTRO [place] W/IMG RIGHT Right 01/01/2018  . IR GENERIC HISTORICAL  07/16/2016   IR REMOVAL TUN CV CATH W/O FL 07/16/2016 Saverio Danker, PA-C [facility]  . IR PTA ADDL CENTRAL DIALYSIS SEG THRU DIALY CIRCUIT RIGHT Right 10/21/2017  . IR REMOVAL TUN CV CATH W/O FL  05/12/2017  . IR THROMBECTOMY AV FISTULA W/THROMBOLYSIS/PTA INC/SHUNT/IMG RIGHT Right 10/21/2017  . IR THROMBECTOMY AV FISTULA W/THROMBOLYSIS/PTA INC/SHUNT/IMG RIGHT Right 11/27/2017  . IR US GUIDE VASC ACCESS RIGHT  10/21/2017  . IR US GUIDE VASC ACCESS RIGHT  11/27/2017  . IR US GUIDE VASC ACCESS RIGHT  01/01/2018  . LIGATION OF ARTERIOVENOUS  FISTULA Left 12/19/2015   Procedure: LIGATION OF RADIOCEPHALIC ARTERIOVENOUS  FISTULA;  Surgeon: Conrad [place], MD;  Location: [place];   Service: Vascular;  Laterality: Left;  Marland Kitchen MASS EXCISION Right 02/18/2017   Procedure: EXCISION OF RIGHT AXILLARY EPIDERMAL INCLUSION CYST;  Surgeon: Conrad [place], MD;  Location: [place];  Service: Vascular;  Laterality: Right;  . PERIPHERAL VASCULAR CATHETERIZATION N/A 12/14/2015   Procedure: Fistulagram;  Surgeon: Conrad , MD;  Location: [place] CV LAB;  Service: Cardiovascular;  Laterality: N/A;  . SHOULDER SURGERY    . ULTRASOUND GUIDANCE FOR VASCULAR ACCESS  04/15/2017   Procedure: Ultrasound Guidance For Vascular Access;  Surgeon: Angelia Mould, MD;  Location: [place] CV LAB;  Service: Cardiovascular;;  . UPPER EXTREMITY VENOGRAPHY Bilateral 12/17/2016   Procedure: Bilateral Upper Extremity Venography;  Surgeon: Serafina Mitchell, MD;  Location: [place] CV LAB;  Service: Cardiovascular;  Laterality: Bilateral;   Social History   Occupational History  . Occupation: retired, Licensed conveyancer    Employer: RETIRED  Tobacco Use  . Smoking status: Former Smoker    Last attempt to quit: 04/29/2004    Years since quitting: 13.9  . Smokeless tobacco: Former Systems developer    Types: Chew    Quit date: 01/16/1987  . Tobacco comment: quit 2006  Substance and Sexual Activity  . Alcohol use: No  . Drug use: No  . Sexual activity: Not on file

## 2018-04-01 DIAGNOSIS — D509 Iron deficiency anemia, unspecified: Secondary | ICD-10-CM | POA: Diagnosis not present

## 2018-04-01 DIAGNOSIS — Z992 Dependence on renal dialysis: Secondary | ICD-10-CM | POA: Diagnosis not present

## 2018-04-01 DIAGNOSIS — N186 End stage renal disease: Secondary | ICD-10-CM | POA: Diagnosis not present

## 2018-04-01 DIAGNOSIS — N2581 Secondary hyperparathyroidism of renal origin: Secondary | ICD-10-CM | POA: Diagnosis not present

## 2018-04-03 DIAGNOSIS — D509 Iron deficiency anemia, unspecified: Secondary | ICD-10-CM | POA: Diagnosis not present

## 2018-04-03 DIAGNOSIS — L97522 Non-pressure chronic ulcer of other part of left foot with fat layer exposed: Secondary | ICD-10-CM | POA: Diagnosis not present

## 2018-04-03 DIAGNOSIS — Z992 Dependence on renal dialysis: Secondary | ICD-10-CM | POA: Diagnosis not present

## 2018-04-03 DIAGNOSIS — N186 End stage renal disease: Secondary | ICD-10-CM | POA: Diagnosis not present

## 2018-04-03 DIAGNOSIS — N2581 Secondary hyperparathyroidism of renal origin: Secondary | ICD-10-CM | POA: Diagnosis not present

## 2018-04-06 DIAGNOSIS — D509 Iron deficiency anemia, unspecified: Secondary | ICD-10-CM | POA: Diagnosis not present

## 2018-04-06 DIAGNOSIS — N2581 Secondary hyperparathyroidism of renal origin: Secondary | ICD-10-CM | POA: Diagnosis not present

## 2018-04-06 DIAGNOSIS — N186 End stage renal disease: Secondary | ICD-10-CM | POA: Diagnosis not present

## 2018-04-06 DIAGNOSIS — Z992 Dependence on renal dialysis: Secondary | ICD-10-CM | POA: Diagnosis not present

## 2018-04-08 DIAGNOSIS — N2581 Secondary hyperparathyroidism of renal origin: Secondary | ICD-10-CM | POA: Diagnosis not present

## 2018-04-08 DIAGNOSIS — Z992 Dependence on renal dialysis: Secondary | ICD-10-CM | POA: Diagnosis not present

## 2018-04-08 DIAGNOSIS — D509 Iron deficiency anemia, unspecified: Secondary | ICD-10-CM | POA: Diagnosis not present

## 2018-04-08 DIAGNOSIS — N186 End stage renal disease: Secondary | ICD-10-CM | POA: Diagnosis not present

## 2018-04-10 DIAGNOSIS — Z992 Dependence on renal dialysis: Secondary | ICD-10-CM | POA: Diagnosis not present

## 2018-04-10 DIAGNOSIS — D509 Iron deficiency anemia, unspecified: Secondary | ICD-10-CM | POA: Diagnosis not present

## 2018-04-10 DIAGNOSIS — N2581 Secondary hyperparathyroidism of renal origin: Secondary | ICD-10-CM | POA: Diagnosis not present

## 2018-04-10 DIAGNOSIS — N186 End stage renal disease: Secondary | ICD-10-CM | POA: Diagnosis not present

## 2018-04-13 DIAGNOSIS — N2581 Secondary hyperparathyroidism of renal origin: Secondary | ICD-10-CM | POA: Diagnosis not present

## 2018-04-13 DIAGNOSIS — Z992 Dependence on renal dialysis: Secondary | ICD-10-CM | POA: Diagnosis not present

## 2018-04-13 DIAGNOSIS — D509 Iron deficiency anemia, unspecified: Secondary | ICD-10-CM | POA: Diagnosis not present

## 2018-04-13 DIAGNOSIS — N186 End stage renal disease: Secondary | ICD-10-CM | POA: Diagnosis not present

## 2018-04-14 DIAGNOSIS — I5022 Chronic systolic (congestive) heart failure: Secondary | ICD-10-CM | POA: Diagnosis not present

## 2018-04-14 DIAGNOSIS — N186 End stage renal disease: Secondary | ICD-10-CM | POA: Diagnosis not present

## 2018-04-14 DIAGNOSIS — I10 Essential (primary) hypertension: Secondary | ICD-10-CM | POA: Diagnosis not present

## 2018-04-14 DIAGNOSIS — I2510 Atherosclerotic heart disease of native coronary artery without angina pectoris: Secondary | ICD-10-CM | POA: Diagnosis not present

## 2018-04-15 DIAGNOSIS — N2581 Secondary hyperparathyroidism of renal origin: Secondary | ICD-10-CM | POA: Diagnosis not present

## 2018-04-15 DIAGNOSIS — N186 End stage renal disease: Secondary | ICD-10-CM | POA: Diagnosis not present

## 2018-04-15 DIAGNOSIS — Z992 Dependence on renal dialysis: Secondary | ICD-10-CM | POA: Diagnosis not present

## 2018-04-15 DIAGNOSIS — D509 Iron deficiency anemia, unspecified: Secondary | ICD-10-CM | POA: Diagnosis not present

## 2018-04-17 DIAGNOSIS — N186 End stage renal disease: Secondary | ICD-10-CM | POA: Diagnosis not present

## 2018-04-17 DIAGNOSIS — Z992 Dependence on renal dialysis: Secondary | ICD-10-CM | POA: Diagnosis not present

## 2018-04-17 DIAGNOSIS — D509 Iron deficiency anemia, unspecified: Secondary | ICD-10-CM | POA: Diagnosis not present

## 2018-04-17 DIAGNOSIS — N2581 Secondary hyperparathyroidism of renal origin: Secondary | ICD-10-CM | POA: Diagnosis not present

## 2018-04-19 DIAGNOSIS — D509 Iron deficiency anemia, unspecified: Secondary | ICD-10-CM | POA: Diagnosis not present

## 2018-04-19 DIAGNOSIS — N2581 Secondary hyperparathyroidism of renal origin: Secondary | ICD-10-CM | POA: Diagnosis not present

## 2018-04-19 DIAGNOSIS — Z992 Dependence on renal dialysis: Secondary | ICD-10-CM | POA: Diagnosis not present

## 2018-04-19 DIAGNOSIS — N186 End stage renal disease: Secondary | ICD-10-CM | POA: Diagnosis not present

## 2018-04-20 DIAGNOSIS — N2581 Secondary hyperparathyroidism of renal origin: Secondary | ICD-10-CM | POA: Diagnosis not present

## 2018-04-20 DIAGNOSIS — D509 Iron deficiency anemia, unspecified: Secondary | ICD-10-CM | POA: Diagnosis not present

## 2018-04-20 DIAGNOSIS — N186 End stage renal disease: Secondary | ICD-10-CM | POA: Diagnosis not present

## 2018-04-20 DIAGNOSIS — Z992 Dependence on renal dialysis: Secondary | ICD-10-CM | POA: Diagnosis not present

## 2018-04-21 DIAGNOSIS — N2581 Secondary hyperparathyroidism of renal origin: Secondary | ICD-10-CM | POA: Diagnosis not present

## 2018-04-21 DIAGNOSIS — D509 Iron deficiency anemia, unspecified: Secondary | ICD-10-CM | POA: Diagnosis not present

## 2018-04-21 DIAGNOSIS — Z992 Dependence on renal dialysis: Secondary | ICD-10-CM | POA: Diagnosis not present

## 2018-04-21 DIAGNOSIS — N186 End stage renal disease: Secondary | ICD-10-CM | POA: Diagnosis not present

## 2018-04-24 DIAGNOSIS — Z992 Dependence on renal dialysis: Secondary | ICD-10-CM | POA: Diagnosis not present

## 2018-04-24 DIAGNOSIS — D509 Iron deficiency anemia, unspecified: Secondary | ICD-10-CM | POA: Diagnosis not present

## 2018-04-24 DIAGNOSIS — N186 End stage renal disease: Secondary | ICD-10-CM | POA: Diagnosis not present

## 2018-04-24 DIAGNOSIS — N2581 Secondary hyperparathyroidism of renal origin: Secondary | ICD-10-CM | POA: Diagnosis not present

## 2018-04-27 DIAGNOSIS — N2581 Secondary hyperparathyroidism of renal origin: Secondary | ICD-10-CM | POA: Diagnosis not present

## 2018-04-27 DIAGNOSIS — N186 End stage renal disease: Secondary | ICD-10-CM | POA: Diagnosis not present

## 2018-04-27 DIAGNOSIS — D509 Iron deficiency anemia, unspecified: Secondary | ICD-10-CM | POA: Diagnosis not present

## 2018-04-27 DIAGNOSIS — Z992 Dependence on renal dialysis: Secondary | ICD-10-CM | POA: Diagnosis not present

## 2018-04-28 ENCOUNTER — Encounter (INDEPENDENT_AMBULATORY_CARE_PROVIDER_SITE_OTHER): Payer: Self-pay | Admitting: Orthopedic Surgery

## 2018-04-28 ENCOUNTER — Ambulatory Visit (INDEPENDENT_AMBULATORY_CARE_PROVIDER_SITE_OTHER): Payer: Medicare Other | Admitting: Physician Assistant

## 2018-04-28 VITALS — Ht 65.0 in | Wt 170.0 lb

## 2018-04-28 DIAGNOSIS — Z992 Dependence on renal dialysis: Secondary | ICD-10-CM | POA: Diagnosis not present

## 2018-04-28 DIAGNOSIS — L97411 Non-pressure chronic ulcer of right heel and midfoot limited to breakdown of skin: Secondary | ICD-10-CM

## 2018-04-28 DIAGNOSIS — I739 Peripheral vascular disease, unspecified: Secondary | ICD-10-CM

## 2018-04-28 DIAGNOSIS — Z89431 Acquired absence of right foot: Secondary | ICD-10-CM

## 2018-04-28 DIAGNOSIS — N186 End stage renal disease: Secondary | ICD-10-CM | POA: Diagnosis not present

## 2018-04-28 MED ORDER — NITROGLYCERIN 0.2 MG/HR TD PT24: 0.2000 mg | MEDICATED_PATCH | Freq: Every day | TRANSDERMAL | 11 refills | Status: DC

## 2018-04-28 NOTE — Progress Notes (Signed)
Office Visit Note   Patient: Joseph GEER Sr.           Date of Birth: 01-11-1941           MRN: [id number] Visit Date: 04/28/2018              Requested by: [name], MD [address] [place], [place] [address] PCP: [name], MD  Chief Complaint  Patient presents with  . Right Foot - Follow-up    TMA  surg I/2019; f/u appt      HPI: Patient is a 77 year old gentleman who is seen for follow-up of his right midfoot ulcers.  He underwent a right transmetatarsal amputation and January 2019 but developed persistent ulceration of the plantar surface.  He has been using some Iodosorb to the ulcer areas.  Assessment & Plan: Visi my favorite is a full-timet Diagnoses:  1. Midfoot skin ulcer, right, limited to breakdown of skin ([place])   2. S/P transmetatarsal amputation of foot, right ([place])   3. PVD (peripheral vascular disease) ([place])   4. ESRD on dialysis [facility])     Plan: The patient will be started on nitroglycerin patches to the foot as well.  A prescription was sent to his pharmacy but he did not mention that he might not be able to afford them as he believes they may have been prescribed in the past and he could not afford them.  He will follow-up next week.  The midfoot was debrided with a #10 blade knife and the patient tolerated this well.  He will continue Iodosorb to the wounds.  We again discussed that there might be underlying deep bone infection and he may require operative debridement.  He will follow next week or sooner should he have difficulties in the interim.  Follow-Up Instructions: Return in about 1 week (around 05/05/2018).   Ortho Exam  Patient is alert, oriented, no adenopathy, well-dressed, normal affect, normal respiratory effort. The ulcers over the right midfoot were debrided with a #10 blade knife and the patient tolerated this well.  He has palpable but diminished pedal pulses.  Working to try some nitroglycerin patches.  He appears now to have  2 distinct areas of ulceration which may actually be conjoined but he does have some tissue island between the 2 ulcers.  Imaging: No results found.   Labs: Lab Results  Component Value Date   REPTSTATUS 02/22/2017 FINAL 02/18/2017   GRAMSTAIN  02/18/2017    RARE WBC PRESENT, PREDOMINANTLY PMN FEW GRAM POSITIVE COCCI    CULT  02/18/2017    NO GROWTH AEROBICALLY MIXED ANAEROBIC FLORA PRESENT.  CALL LAB IF FURTHER IID REQUIRED.      Lab Results  Component Value Date   ALBUMIN 2.9 (L) 05/12/2017   ALBUMIN 3.0 (L) 05/10/2017   ALBUMIN 3.3 (L) 03/31/2017    Body mass index is 28.29 kg/m.  Orders:  No orders of the defined types were placed in this encounter.  Meds ordered this encounter  Medications  . DISCONTD: nitroGLYCERIN (NITRODUR - DOSED IN MG/24 HR) 0.2 mg/hr patch    Sig: Place 1 patch (0.2 mg total) onto the skin daily. Apply 1 patch to dorsum of foot daily.    Dispense:  30 patch    Refill:  11  . nitroGLYCERIN (NITRODUR - DOSED IN MG/24 HR) 0.2 mg/hr patch    Sig: Place 1 patch (0.2 mg total) onto the skin daily. Apply 1 patch to dorsum of foot daily.  Dispense:  30 patch    Refill:  11     Procedures: No procedures performed  Clinical Data: No additional findings.  ROS:  All other systems negative, except as noted in the HPI. Review of Systems  Objective: Vital Signs: Ht 5\' 5"  (1.651 m)   Wt 170 lb (77.1 kg)   BMI 28.29 kg/m   Specialty Comments:  No specialty comments available.  PMFS History: Patient Active Problem List   Diagnosis Date Noted  . S/P transmetatarsal amputation of foot, right ([place]) 05/09/2017  . PVD (peripheral vascular disease) ([place]) 04/15/2017  . Nonischemic cardiomyopathy ([place]) 08/15/2015  . Aortic stenosis 08/15/2015  . Moderate aortic stenosis 07/12/2015  . 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 CAD- 40-50% LAD at cath 07/11/15 07/11/2015  . Aspirin intolerance 07/11/2015  . Abnormal stress test    . ESRD on dialysis ([place]) 05/30/2015  . CTS (carpal tunnel syndrome) 02/28/2015  . PVD of LE - Dr Oneida Alar follows 08/20/2012  . Midfoot skin ulcer, right, limited to breakdown of skin ([place]) 01/16/2012  . Encounter for screening colonoscopy 10/08/2011  . Melena 10/08/2011  . Other complications due to renal dialysis device, implant, and graft 09/24/2011   Past Medical History:  Diagnosis Date  . Anxiety   . Aortic stenosis   . Arthritis   . CVA (cerebral infarction)   . ESRD (end stage renal disease) on dialysis [facility])    M/W/F at [facility] in [place]  . Essential hypertension   . Gangrene of right foot ([place])   . Gastric ulcer 2004  . History of cardiomyopathy    LVEF normal as of February 2017  . History of stroke    Left side weakness  . Iron deficiency anemia     Family History  Problem Relation Age of Onset  . Hypertension Mother   . Colon cancer Neg Hx   . Liver disease Neg Hx     Past Surgical History:  Procedure Laterality Date  . ABDOMINAL AORTAGRAM N/A 01/24/2012   Procedure: ABDOMINAL Maxcine Ham;  Surgeon: Elam Dutch, MD;  Location: [facility] CATH LAB;  Service: Cardiovascular;  Laterality: N/A;  . ABDOMINAL AORTOGRAM W/LOWER EXTREMITY N/A 04/15/2017   Procedure: ABDOMINAL AORTOGRAM W/LOWER EXTREMITY;  Surgeon: Angelia Mould, MD;  Location: [place] CV LAB;  Service: Cardiovascular;  Laterality: N/A;  . AMPUTATION Right 05/09/2017   Procedure: RIGHT TRANSMETATARSAL AMPUTATION;  Surgeon: [name], MD;  Location: [place];  Service: Orthopedics;  Laterality: Right;  . ARTERIOVENOUS GRAFT PLACEMENT Right right arm  . AV FISTULA PLACEMENT Left 08/31/2015   Procedure: ARTERIOVENOUS (AV) FISTULA CREATION- LEFT RADIOCEPHALIC;  Surgeon: Mal Misty, MD;  Location: [place];  Service: Vascular;  Laterality: Left;  . AV FISTULA PLACEMENT Right 02/18/2017   Procedure: INSERTION OF ARTERIOVENOUS (AV) GORE-TEX GRAFT  RIGHT UPPER ARM;  Surgeon: Conrad [place], MD;   Location: [place];  Service: Vascular;  Laterality: Right;  . BASCILIC VEIN TRANSPOSITION Left 12/19/2015   Procedure: FIRST STAGE BRACHIAL VEIN TRANSPOSITION;  Surgeon: Conrad [place], MD;  Location: [place];  Service: Vascular;  Laterality: Left;  . BASCILIC VEIN TRANSPOSITION Left 02/08/2016   Procedure: SECOND STAGE BRACHIAL VEIN TRANSPOSITION;  Surgeon: Conrad [place], MD;  Location: [place];  Service: Vascular;  Laterality: Left;  . CARDIAC CATHETERIZATION N/A 07/11/2015   Procedure: Left Heart Cath and Coronary Angiography;  Surgeon: Troy Sine, MD;  Location: [place] CV LAB;  Service: Cardiovascular;  Laterality:  N/A;  . Carpel Tunnel Left Dec. 22, 2016  . CHOLECYSTECTOMY    . COLONOSCOPY  2004   Dr. Irving Shows, left sided diverticula and cecal polyp, path unknown  . COLONOSCOPY  10/29/2011   Procedure: COLONOSCOPY;  Surgeon: Daneil Dolin, MD;  [facility];  Service: Endoscopy;  Laterality: N/A;  10:15  . ESOPHAGOGASTRODUODENOSCOPY  11/2002   Dr. Gala Romney, erosive reflux esophagitis, multiple gastric ulcer and antral/bulbar erosions. Serologies positive for H.Pylori and was treated  . ESOPHAGOGASTRODUODENOSCOPY  11/20014   Dr. Gala Romney, small hh only, ulcers healed  . ESOPHAGOGASTRODUODENOSCOPY  09/21/2011   Dr Trevor Iha HH, antral erosions, ?early GAVE  . FISTULOGRAM Left 12/10/2016   Procedure: THROMBECTOMY OF LEFT ARM ARTERIOVENOUS FISTULA;  Surgeon: Waynetta Sandy, MD;  Location: [place];  Service: Vascular;  Laterality: Left;  . INSERTION OF DIALYSIS CATHETER Left 12/10/2016   Procedure: INSERTION OF TUNNELED DIALYSIS CATHETER;  Surgeon: Waynetta Sandy, MD;  Location: [place];  Service: Vascular;  Laterality: Left;  . IR DIALY SHUNT INTRO [place] W/IMG RIGHT Right 01/01/2018  . IR GENERIC HISTORICAL  07/16/2016   IR REMOVAL TUN CV CATH W/O FL 07/16/2016 Saverio Danker, PA-C [facility]INTER[facility]  . IR PTA ADDL CENTRAL DIALYSIS SEG THRU DIALY CIRCUIT RIGHT  Right 10/21/2017  . IR REMOVAL TUN CV CATH W/O FL  05/12/2017  . IR THROMBECTOMY AV FISTULA W/THROMBOLYSIS/PTA INC/SHUNT/IMG RIGHT Right 10/21/2017  . IR THROMBECTOMY AV FISTULA W/THROMBOLYSIS/PTA INC/SHUNT/IMG RIGHT Right 11/27/2017  . IR US GUIDE VASC ACCESS RIGHT  10/21/2017  . IR US GUIDE VASC ACCESS RIGHT  11/27/2017  . IR US GUIDE VASC ACCESS RIGHT  01/01/2018  . LIGATION OF ARTERIOVENOUS  FISTULA Left 12/19/2015   Procedure: LIGATION OF RADIOCEPHALIC ARTERIOVENOUS  FISTULA;  Surgeon: Conrad [place], MD;  Location: [place];  Service: Vascular;  Laterality: Left;  Marland Kitchen MASS EXCISION Right 02/18/2017   Procedure: EXCISION OF RIGHT AXILLARY EPIDERMAL INCLUSION CYST;  Surgeon: Conrad [place], MD;  Location: [place];  Service: Vascular;  Laterality: Right;  . PERIPHERAL VASCULAR CATHETERIZATION N/A 12/14/2015   Procedure: Fistulagram;  Surgeon: Conrad [place], MD;  Location: [place] CV LAB;  Service: Cardiovascular;  Laterality: N/A;  . SHOULDER SURGERY    . ULTRASOUND GUIDANCE FOR VASCULAR ACCESS  04/15/2017   Procedure: Ultrasound Guidance For Vascular Access;  Surgeon: Angelia Mould, MD;  Location: [place] CV LAB;  Service: Cardiovascular;;  . UPPER EXTREMITY VENOGRAPHY Bilateral 12/17/2016   Procedure: Bilateral Upper Extremity Venography;  Surgeon: Serafina Mitchell, MD;  Location: [place] CV LAB;  Service: Cardiovascular;  Laterality: Bilateral;   Social History   Occupational History  . Occupation: retired, Licensed conveyancer    Employer: RETIRED  Tobacco Use  . Smoking status: Former Smoker    Last attempt to quit: 04/29/2004    Years since quitting: 14.0  . Smokeless tobacco: Former Systems developer    Types: Chew    Quit date: 01/16/1987  . Tobacco comment: quit 2006  Substance and Sexual Activity  . Alcohol use: No  . Drug use: No  . Sexual activity: Not on file

## 2018-04-29 DIAGNOSIS — Z992 Dependence on renal dialysis: Secondary | ICD-10-CM | POA: Diagnosis not present

## 2018-04-29 DIAGNOSIS — N186 End stage renal disease: Secondary | ICD-10-CM | POA: Diagnosis not present

## 2018-04-29 DIAGNOSIS — N2581 Secondary hyperparathyroidism of renal origin: Secondary | ICD-10-CM | POA: Diagnosis not present

## 2018-04-29 DIAGNOSIS — D509 Iron deficiency anemia, unspecified: Secondary | ICD-10-CM | POA: Diagnosis not present

## 2018-05-01 ENCOUNTER — Encounter (INDEPENDENT_AMBULATORY_CARE_PROVIDER_SITE_OTHER): Payer: Self-pay | Admitting: Physician Assistant

## 2018-05-01 DIAGNOSIS — N2581 Secondary hyperparathyroidism of renal origin: Secondary | ICD-10-CM | POA: Diagnosis not present

## 2018-05-01 DIAGNOSIS — N186 End stage renal disease: Secondary | ICD-10-CM | POA: Diagnosis not present

## 2018-05-01 DIAGNOSIS — Z992 Dependence on renal dialysis: Secondary | ICD-10-CM | POA: Diagnosis not present

## 2018-05-01 DIAGNOSIS — D509 Iron deficiency anemia, unspecified: Secondary | ICD-10-CM | POA: Diagnosis not present

## 2018-05-04 DIAGNOSIS — N2581 Secondary hyperparathyroidism of renal origin: Secondary | ICD-10-CM | POA: Diagnosis not present

## 2018-05-04 DIAGNOSIS — N186 End stage renal disease: Secondary | ICD-10-CM | POA: Diagnosis not present

## 2018-05-04 DIAGNOSIS — D509 Iron deficiency anemia, unspecified: Secondary | ICD-10-CM | POA: Diagnosis not present

## 2018-05-04 DIAGNOSIS — Z992 Dependence on renal dialysis: Secondary | ICD-10-CM | POA: Diagnosis not present

## 2018-05-05 ENCOUNTER — Encounter (INDEPENDENT_AMBULATORY_CARE_PROVIDER_SITE_OTHER): Payer: Self-pay | Admitting: Physician Assistant

## 2018-05-05 ENCOUNTER — Ambulatory Visit (INDEPENDENT_AMBULATORY_CARE_PROVIDER_SITE_OTHER): Payer: Medicare Other | Admitting: Physician Assistant

## 2018-05-05 DIAGNOSIS — Z89431 Acquired absence of right foot: Secondary | ICD-10-CM | POA: Diagnosis not present

## 2018-05-05 DIAGNOSIS — N186 End stage renal disease: Secondary | ICD-10-CM

## 2018-05-05 DIAGNOSIS — L97411 Non-pressure chronic ulcer of right heel and midfoot limited to breakdown of skin: Secondary | ICD-10-CM | POA: Diagnosis not present

## 2018-05-05 DIAGNOSIS — Z992 Dependence on renal dialysis: Secondary | ICD-10-CM | POA: Diagnosis not present

## 2018-05-05 DIAGNOSIS — I739 Peripheral vascular disease, unspecified: Secondary | ICD-10-CM

## 2018-05-05 MED ORDER — PENTOXIFYLLINE ER 400 MG PO TBCR: 400.0000 mg | EXTENDED_RELEASE_TABLET | Freq: Three times a day (TID) | ORAL | 11 refills | Status: DC

## 2018-05-05 MED ORDER — NITROGLYCERIN 0.2 MG/HR TD PT24: 0.2000 mg | MEDICATED_PATCH | Freq: Every day | TRANSDERMAL | 11 refills | Status: DC

## 2018-05-05 NOTE — Progress Notes (Signed)
Office Visit Note   Patient: Joseph KADLEC Sr.           Date of Birth: 1940/07/03           MRN: [id number] Visit Date: 05/05/2018              Requested by: [name], MD [address] [place], [place] [address] PCP: [name], MD  Chief Complaint  Patient presents with  . Right Foot - Follow-up      HPI: The patient is a 78 year old gentleman who is seen for follow-up of his right midfoot ulcers.  He underwent a right transmetatarsal amputation in January 2019 but developed a persistent ulceration of the plantar surface.  Assessment & Plan: Visit Diagnoses:  1. Midfoot skin ulcer, right, limited to breakdown of skin ([place])   2. S/P transmetatarsal amputation of foot, right ([place])   3. PVD (peripheral vascular disease) ([place])   4. ESRD on dialysis [facility])     Plan: After informed consent the calluses and ulcers over the right midfoot were debrided to bleeding healthier appearing soft tissue and the patient tolerated this well.  Will reorder the nitroglycerin patches for him to utilize on his foot as well as Trental 400 mg p.o.3 times daily.  We also provided him with a stump shrinker stocking to utilize to help with edema control.  We will see him back in 2 weeks.  Follow-Up Instructions: Return in about 2 weeks (around 05/19/2018).   Ortho Exam  Patient is alert, oriented, no adenopathy, well-dressed, normal affect, normal respiratory effort. The right foot plantar midfoot ulcers are looking improved.  There is less drainage and less odor.  After informed consent the areas were debrided of callus and peri-wound necrotic tissue to healthy bleeding tissue and this was looking much better following debridement.  There are 2 over the first and fifth metatarsal are about 0.5 cm in diameter each and are through the fat layer but there is no visible tendon or bone.  There is no periwound cellulitis.  There is mild edema but good pedal pulses.  Imaging: No results  found. No images are attached to the encounter.  Labs: Lab Results  Component Value Date   REPTSTATUS 02/22/2017 FINAL 02/18/2017   GRAMSTAIN  02/18/2017    RARE WBC PRESENT, PREDOMINANTLY PMN FEW GRAM POSITIVE COCCI    CULT  02/18/2017    NO GROWTH AEROBICALLY MIXED ANAEROBIC FLORA PRESENT.  CALL LAB IF FURTHER IID REQUIRED.      Lab Results  Component Value Date   ALBUMIN 2.9 (L) 05/12/2017   ALBUMIN 3.0 (L) 05/10/2017   ALBUMIN 3.3 (L) 03/31/2017    There is no height or weight on file to calculate BMI.  Orders:  No orders of the defined types were placed in this encounter.  Meds ordered this encounter  Medications  . nitroGLYCERIN (NITRODUR - DOSED IN MG/24 HR) 0.2 mg/hr patch    Sig: Place 1 patch (0.2 mg total) onto the skin daily. Apply 1 patch to dorsum of foot daily.    Dispense:  30 patch    Refill:  11  . pentoxifylline (TRENTAL) 400 MG CR tablet    Sig: Take 1 tablet (400 mg total) by mouth 3 (three) times daily with meals.    Dispense:  90 tablet    Refill:  11     Procedures: No procedures performed  Clinical Data: No additional findings.  ROS:  All other systems negative, except as  noted in the HPI. Review of Systems  Objective: Vital Signs: There were no vitals taken for this visit.  Specialty Comments:  No specialty comments available.  PMFS History: Patient Active Problem List   Diagnosis Date Noted  . S/P transmetatarsal amputation of foot, right ([place]) 05/09/2017  . PVD (peripheral vascular disease) ([place]) 04/15/2017  . Nonischemic cardiomyopathy ([place]) 08/15/2015  . Aortic stenosis 08/15/2015  . Moderate aortic stenosis 07/12/2015  . 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 CAD- 40-50% LAD at cath 07/11/15 07/11/2015  . Aspirin intolerance 07/11/2015  . Abnormal stress test   . ESRD on dialysis ([place]) 05/30/2015  . CTS (carpal tunnel syndrome) 02/28/2015  . PVD of LE - Dr Oneida Alar follows 08/20/2012  . Midfoot skin  ulcer, right, limited to breakdown of skin ([place]) 01/16/2012  . Encounter for screening colonoscopy 10/08/2011  . Melena 10/08/2011  . Other complications due to renal dialysis device, implant, and graft 09/24/2011   Past Medical History:  Diagnosis Date  . Anxiety   . Aortic stenosis   . Arthritis   . CVA (cerebral infarction)   . ESRD (end stage renal disease) on dialysis [facility])    M/W/F at [facility] in [place]  . Essential hypertension   . Gangrene of right foot ([place])   . Gastric ulcer 2004  . History of cardiomyopathy    LVEF normal as of February 2017  . History of stroke    Left side weakness  . Iron deficiency anemia     Family History  Problem Relation Age of Onset  . Hypertension Mother   . Colon cancer Neg Hx   . Liver disease Neg Hx     Past Surgical History:  Procedure Laterality Date  . ABDOMINAL AORTAGRAM N/A 01/24/2012   Procedure: ABDOMINAL Maxcine Ham;  Surgeon: Elam Dutch, MD;  Location: [facility] CATH LAB;  Service: Cardiovascular;  Laterality: N/A;  . ABDOMINAL AORTOGRAM W/LOWER EXTREMITY N/A 04/15/2017   Procedure: ABDOMINAL AORTOGRAM W/LOWER EXTREMITY;  Surgeon: Angelia Mould, MD;  Location: [place] CV LAB;  Service: Cardiovascular;  Laterality: N/A;  . AMPUTATION Right 05/09/2017   Procedure: RIGHT TRANSMETATARSAL AMPUTATION;  Surgeon: [name], MD;  Location: [place];  Service: Orthopedics;  Laterality: Right;  . ARTERIOVENOUS GRAFT PLACEMENT Right right arm  . AV FISTULA PLACEMENT Left 08/31/2015   Procedure: ARTERIOVENOUS (AV) FISTULA CREATION- LEFT RADIOCEPHALIC;  Surgeon: Mal Misty, MD;  Location: [place];  Service: Vascular;  Laterality: Left;  . AV FISTULA PLACEMENT Right 02/18/2017   Procedure: INSERTION OF ARTERIOVENOUS (AV) GORE-TEX GRAFT  RIGHT UPPER ARM;  Surgeon: Conrad [place], MD;  Location: [place];  Service: Vascular;  Laterality: Right;  . BASCILIC VEIN TRANSPOSITION Left 12/19/2015   Procedure: FIRST STAGE BRACHIAL VEIN  TRANSPOSITION;  Surgeon: Conrad [place], MD;  Location: [place];  Service: Vascular;  Laterality: Left;  . BASCILIC VEIN TRANSPOSITION Left 02/08/2016   Procedure: SECOND STAGE BRACHIAL VEIN TRANSPOSITION;  Surgeon: Conrad [place], MD;  Location: [place];  Service: Vascular;  Laterality: Left;  . CARDIAC CATHETERIZATION N/A 07/11/2015   Procedure: Left Heart Cath and Coronary Angiography;  Surgeon: Troy Sine, MD;  Location: [place] CV LAB;  Service: Cardiovascular;  Laterality: N/A;  . Carpel Tunnel Left Dec. 22, 2016  . CHOLECYSTECTOMY    . COLONOSCOPY  2004   Dr. Irving Shows, left sided diverticula and cecal polyp, path unknown  . COLONOSCOPY  10/29/2011   Procedure: COLONOSCOPY;  Surgeon:  Daneil Dolin, MD;  [facility];  Service: Endoscopy;  Laterality: N/A;  10:15  . ESOPHAGOGASTRODUODENOSCOPY  11/2002   Dr. Gala Romney, erosive reflux esophagitis, multiple gastric ulcer and antral/bulbar erosions. Serologies positive for H.Pylori and was treated  . ESOPHAGOGASTRODUODENOSCOPY  11/20014   Dr. Gala Romney, small hh only, ulcers healed  . ESOPHAGOGASTRODUODENOSCOPY  09/21/2011   Dr Trevor Iha HH, antral erosions, ?early GAVE  . FISTULOGRAM Left 12/10/2016   Procedure: THROMBECTOMY OF LEFT ARM ARTERIOVENOUS FISTULA;  Surgeon: Waynetta Sandy, MD;  Location: [place];  Service: Vascular;  Laterality: Left;  . INSERTION OF DIALYSIS CATHETER Left 12/10/2016   Procedure: INSERTION OF TUNNELED DIALYSIS CATHETER;  Surgeon: Waynetta Sandy, MD;  Location: [place];  Service: Vascular;  Laterality: Left;  . IR DIALY SHUNT INTRO [place] W/IMG RIGHT Right 01/01/2018  . IR GENERIC HISTORICAL  07/16/2016   IR REMOVAL TUN CV CATH W/O FL 07/16/2016 Saverio Danker, PA-C [facility]INTER[facility]  . IR PTA ADDL CENTRAL DIALYSIS SEG THRU DIALY CIRCUIT RIGHT Right 10/21/2017  . IR REMOVAL TUN CV CATH W/O FL  05/12/2017  . IR THROMBECTOMY AV FISTULA W/THROMBOLYSIS/PTA INC/SHUNT/IMG RIGHT Right  10/21/2017  . IR THROMBECTOMY AV FISTULA W/THROMBOLYSIS/PTA INC/SHUNT/IMG RIGHT Right 11/27/2017  . IR US GUIDE VASC ACCESS RIGHT  10/21/2017  . IR US GUIDE VASC ACCESS RIGHT  11/27/2017  . IR US GUIDE VASC ACCESS RIGHT  01/01/2018  . LIGATION OF ARTERIOVENOUS  FISTULA Left 12/19/2015   Procedure: LIGATION OF RADIOCEPHALIC ARTERIOVENOUS  FISTULA;  Surgeon: Conrad [place], MD;  Location: [place];  Service: Vascular;  Laterality: Left;  Marland Kitchen MASS EXCISION Right 02/18/2017   Procedure: EXCISION OF RIGHT AXILLARY EPIDERMAL INCLUSION CYST;  Surgeon: Conrad [place], MD;  Location: [place];  Service: Vascular;  Laterality: Right;  . PERIPHERAL VASCULAR CATHETERIZATION N/A 12/14/2015   Procedure: Fistulagram;  Surgeon: Conrad [place], MD;  Location: [place] CV LAB;  Service: Cardiovascular;  Laterality: N/A;  . SHOULDER SURGERY    . ULTRASOUND GUIDANCE FOR VASCULAR ACCESS  04/15/2017   Procedure: Ultrasound Guidance For Vascular Access;  Surgeon: Angelia Mould, MD;  Location: [place] CV LAB;  Service: Cardiovascular;;  . UPPER EXTREMITY VENOGRAPHY Bilateral 12/17/2016   Procedure: Bilateral Upper Extremity Venography;  Surgeon: Serafina Mitchell, MD;  Location: [place] CV LAB;  Service: Cardiovascular;  Laterality: Bilateral;   Social History   Occupational History  . Occupation: retired, Licensed conveyancer    Employer: RETIRED  Tobacco Use  . Smoking status: Former Smoker    Last attempt to quit: 04/29/2004    Years since quitting: 14.0  . Smokeless tobacco: Former Systems developer    Types: Chew    Quit date: 01/16/1987  . Tobacco comment: quit 2006  Substance and Sexual Activity  . Alcohol use: No  . Drug use: No  . Sexual activity: Not on file

## 2018-05-06 DIAGNOSIS — D509 Iron deficiency anemia, unspecified: Secondary | ICD-10-CM | POA: Diagnosis not present

## 2018-05-06 DIAGNOSIS — Z992 Dependence on renal dialysis: Secondary | ICD-10-CM | POA: Diagnosis not present

## 2018-05-06 DIAGNOSIS — N2581 Secondary hyperparathyroidism of renal origin: Secondary | ICD-10-CM | POA: Diagnosis not present

## 2018-05-06 DIAGNOSIS — N186 End stage renal disease: Secondary | ICD-10-CM | POA: Diagnosis not present

## 2018-05-08 DIAGNOSIS — Z992 Dependence on renal dialysis: Secondary | ICD-10-CM | POA: Diagnosis not present

## 2018-05-08 DIAGNOSIS — D509 Iron deficiency anemia, unspecified: Secondary | ICD-10-CM | POA: Diagnosis not present

## 2018-05-08 DIAGNOSIS — N2581 Secondary hyperparathyroidism of renal origin: Secondary | ICD-10-CM | POA: Diagnosis not present

## 2018-05-08 DIAGNOSIS — N186 End stage renal disease: Secondary | ICD-10-CM | POA: Diagnosis not present

## 2018-05-09 DIAGNOSIS — Z992 Dependence on renal dialysis: Secondary | ICD-10-CM | POA: Diagnosis not present

## 2018-05-09 DIAGNOSIS — N186 End stage renal disease: Secondary | ICD-10-CM | POA: Diagnosis not present

## 2018-05-09 DIAGNOSIS — D509 Iron deficiency anemia, unspecified: Secondary | ICD-10-CM | POA: Diagnosis not present

## 2018-05-09 DIAGNOSIS — N2581 Secondary hyperparathyroidism of renal origin: Secondary | ICD-10-CM | POA: Diagnosis not present

## 2018-05-11 DIAGNOSIS — D509 Iron deficiency anemia, unspecified: Secondary | ICD-10-CM | POA: Diagnosis not present

## 2018-05-11 DIAGNOSIS — Z992 Dependence on renal dialysis: Secondary | ICD-10-CM | POA: Diagnosis not present

## 2018-05-11 DIAGNOSIS — N2581 Secondary hyperparathyroidism of renal origin: Secondary | ICD-10-CM | POA: Diagnosis not present

## 2018-05-11 DIAGNOSIS — N186 End stage renal disease: Secondary | ICD-10-CM | POA: Diagnosis not present

## 2018-05-13 DIAGNOSIS — Z992 Dependence on renal dialysis: Secondary | ICD-10-CM | POA: Diagnosis not present

## 2018-05-13 DIAGNOSIS — D509 Iron deficiency anemia, unspecified: Secondary | ICD-10-CM | POA: Diagnosis not present

## 2018-05-13 DIAGNOSIS — N2581 Secondary hyperparathyroidism of renal origin: Secondary | ICD-10-CM | POA: Diagnosis not present

## 2018-05-13 DIAGNOSIS — N186 End stage renal disease: Secondary | ICD-10-CM | POA: Diagnosis not present

## 2018-05-15 DIAGNOSIS — Z992 Dependence on renal dialysis: Secondary | ICD-10-CM | POA: Diagnosis not present

## 2018-05-15 DIAGNOSIS — N2581 Secondary hyperparathyroidism of renal origin: Secondary | ICD-10-CM | POA: Diagnosis not present

## 2018-05-15 DIAGNOSIS — D509 Iron deficiency anemia, unspecified: Secondary | ICD-10-CM | POA: Diagnosis not present

## 2018-05-15 DIAGNOSIS — N186 End stage renal disease: Secondary | ICD-10-CM | POA: Diagnosis not present

## 2018-05-18 DIAGNOSIS — D509 Iron deficiency anemia, unspecified: Secondary | ICD-10-CM | POA: Diagnosis not present

## 2018-05-18 DIAGNOSIS — N2581 Secondary hyperparathyroidism of renal origin: Secondary | ICD-10-CM | POA: Diagnosis not present

## 2018-05-18 DIAGNOSIS — Z992 Dependence on renal dialysis: Secondary | ICD-10-CM | POA: Diagnosis not present

## 2018-05-18 DIAGNOSIS — N186 End stage renal disease: Secondary | ICD-10-CM | POA: Diagnosis not present

## 2018-05-18 IMAGING — DX DG CHEST 2V
2 series · 2 of 2 positions shown · non-contrast
Comparison: 12/10/2016

CLINICAL DATA: Ch[place].

EXAM:
CHEST  2 VIEW

[chest lat]
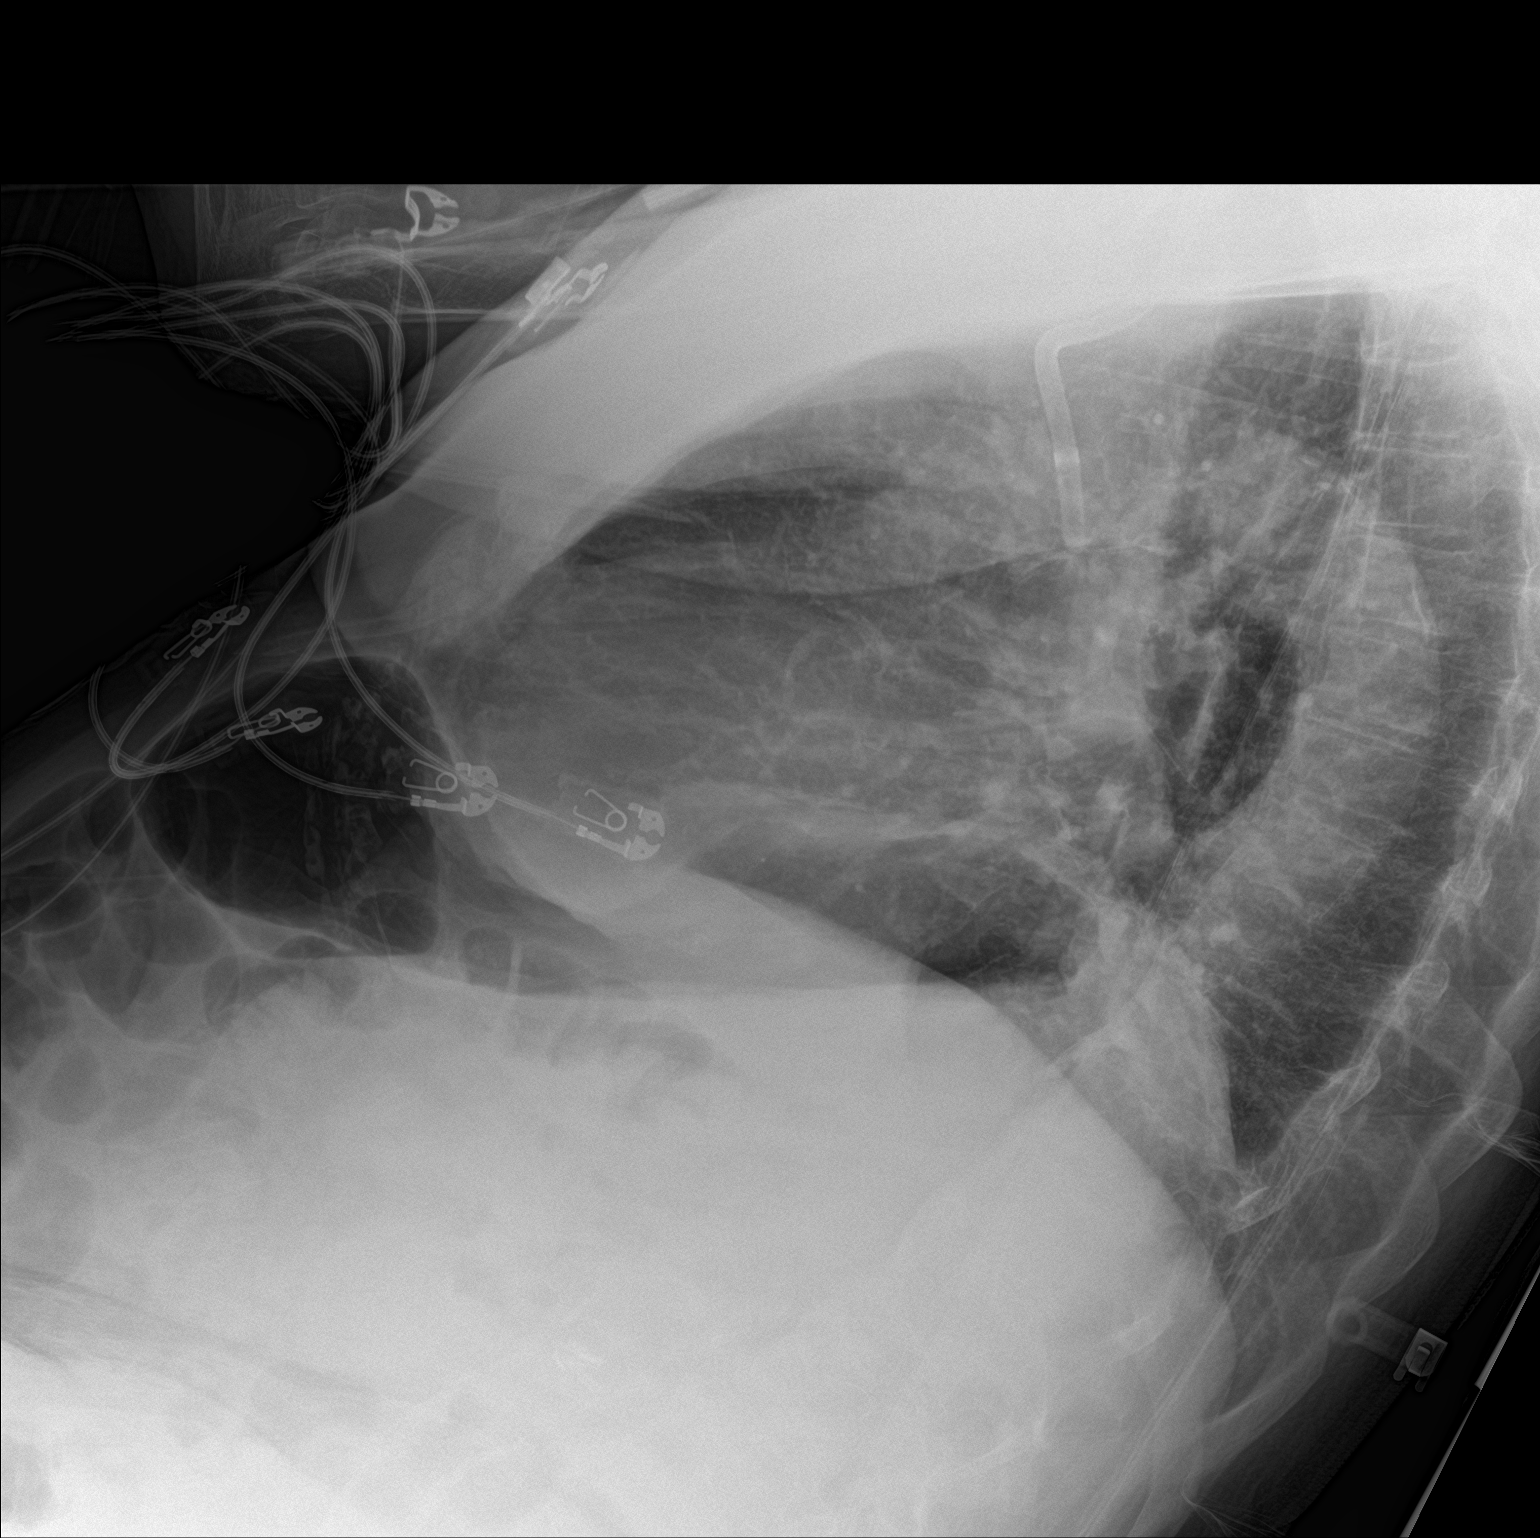

[chest ap]
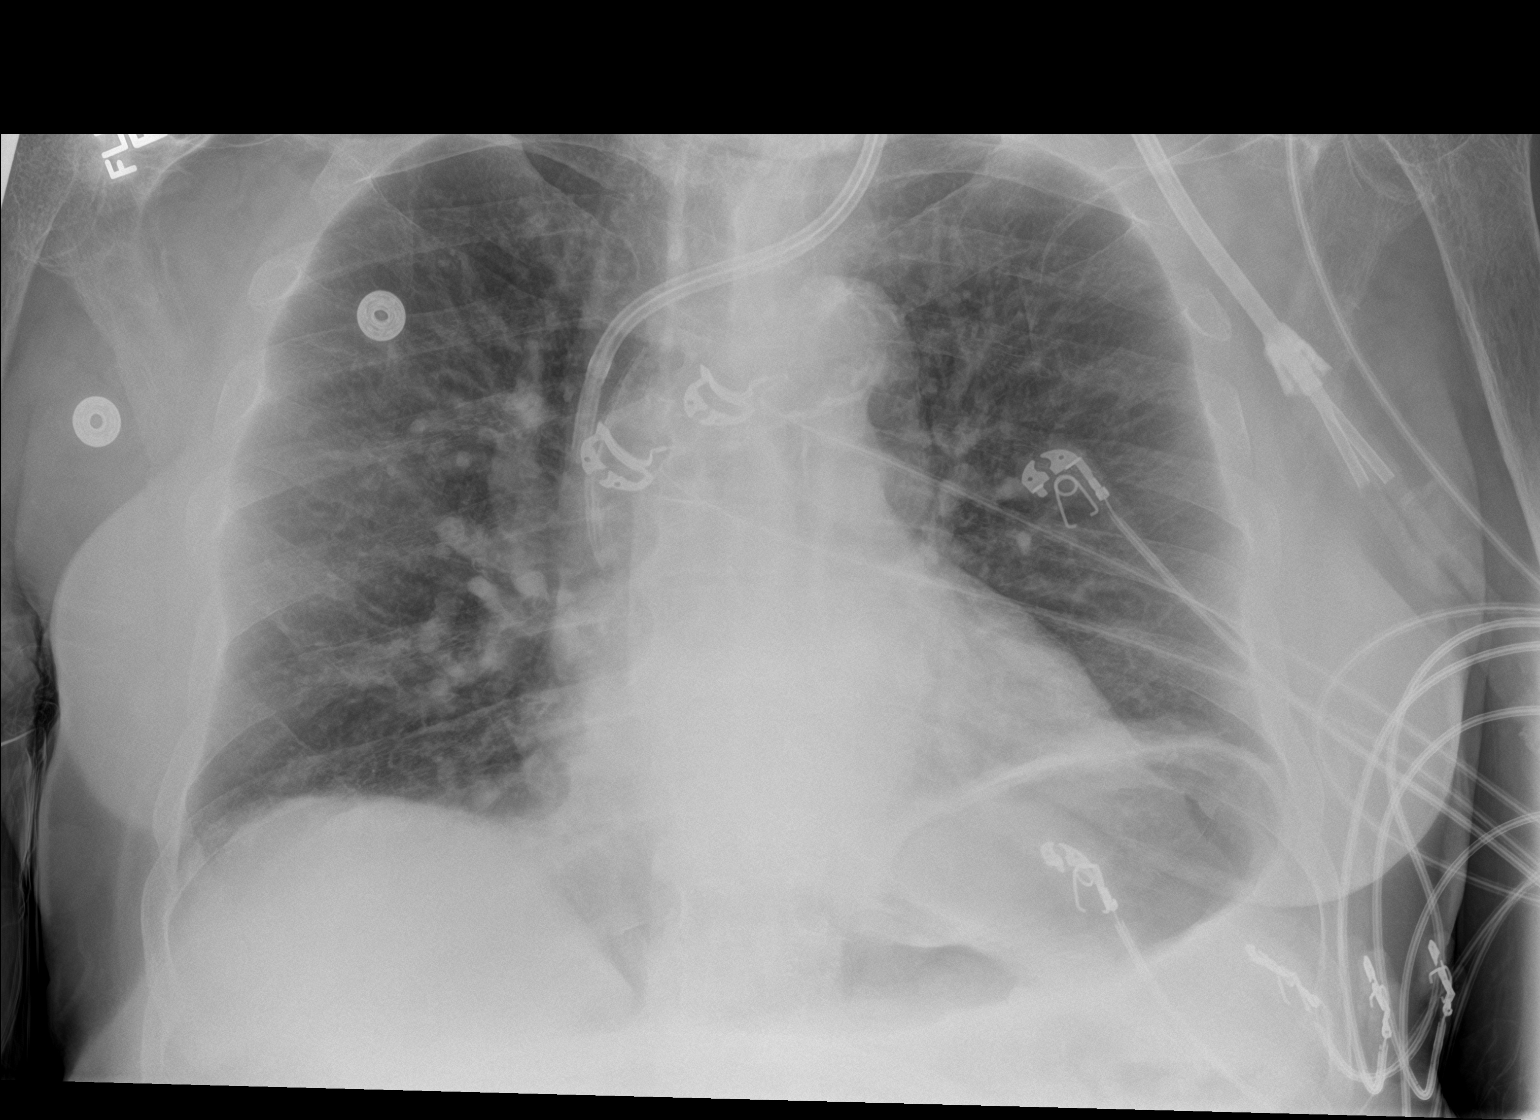

[2 of 2 positions shown; findings below may reference images not displayed]

FINDINGS: Borderline heart size that is stable. Dialysis catheter on the left
with tip at the SVC level. Stable tortuosity of the atherosclerotic
aorta. Chronic vascular prominence. No pulmonary edema or pleural
effusion. No evidence of pneumonia.

No acute osseous finding.  Postoperative right humerus.
IMPRESSION: No evidence of acute disease.  Stable from prior.

## 2018-05-20 DIAGNOSIS — D509 Iron deficiency anemia, unspecified: Secondary | ICD-10-CM | POA: Diagnosis not present

## 2018-05-20 DIAGNOSIS — N2581 Secondary hyperparathyroidism of renal origin: Secondary | ICD-10-CM | POA: Diagnosis not present

## 2018-05-20 DIAGNOSIS — Z992 Dependence on renal dialysis: Secondary | ICD-10-CM | POA: Diagnosis not present

## 2018-05-20 DIAGNOSIS — N186 End stage renal disease: Secondary | ICD-10-CM | POA: Diagnosis not present

## 2018-05-21 ENCOUNTER — Ambulatory Visit (INDEPENDENT_AMBULATORY_CARE_PROVIDER_SITE_OTHER): Payer: Medicare Other | Admitting: Orthopedic Surgery

## 2018-05-21 ENCOUNTER — Encounter (INDEPENDENT_AMBULATORY_CARE_PROVIDER_SITE_OTHER): Payer: Self-pay | Admitting: Orthopedic Surgery

## 2018-05-21 DIAGNOSIS — L97411 Non-pressure chronic ulcer of right heel and midfoot limited to breakdown of skin: Secondary | ICD-10-CM | POA: Diagnosis not present

## 2018-05-22 ENCOUNTER — Encounter (INDEPENDENT_AMBULATORY_CARE_PROVIDER_SITE_OTHER): Payer: Self-pay | Admitting: Orthopedic Surgery

## 2018-05-22 DIAGNOSIS — Z992 Dependence on renal dialysis: Secondary | ICD-10-CM | POA: Diagnosis not present

## 2018-05-22 DIAGNOSIS — N186 End stage renal disease: Secondary | ICD-10-CM | POA: Diagnosis not present

## 2018-05-22 DIAGNOSIS — D509 Iron deficiency anemia, unspecified: Secondary | ICD-10-CM | POA: Diagnosis not present

## 2018-05-22 DIAGNOSIS — N2581 Secondary hyperparathyroidism of renal origin: Secondary | ICD-10-CM | POA: Diagnosis not present

## 2018-05-22 NOTE — Progress Notes (Signed)
Office Visit Note   Patient: Joseph BILLEY Sr.           Date of Birth: March 27, 1941           MRN: [id number] Visit Date: 05/21/2018              Requested by: [name], MD [address] [place], [place] [address] PCP: [name], MD  Chief Complaint  Patient presents with  . Right Foot - Pain, Follow-up      HPI: Patient is a 78 year old gentleman status post transmetatarsal amputation of the right who presents with ulceration beneath the right midfoot.  He states he is doing well has some pain at times.  States there is minimal drainage from the wounds.  States he does not use a dressing.  Assessment & Plan: Visit Diagnoses:  1. Midfoot skin ulcer, right, limited to breakdown of skin ([place])     Plan: Ulcers were debrided of skin and soft tissue.  Recommended dressing changes daily protected weightbearing reevaluate in 4 weeks.  Follow-Up Instructions: Return in about 4 weeks (around 06/18/2018).   Ortho Exam  Patient is alert, oriented, no adenopathy, well-dressed, normal affect, normal respiratory effort. Examination patient is a stable transmetatarsal amputation there is increased callus and ulceration over the forefoot he has dorsiflexion to neutral.  After informed consent a 10 blade knife was used to debride the skin and soft tissue back to healthy viable tissue the ulcerative area is 6 x 3 cm and there are 2 areas that are about 5 mm deep 10 mm in diameter.  Silver nitrate was used for hemostasis there is no exposed bone no exposed tendon no drainage no cellulitis.  Imaging: No results found. No images are attached to the encounter.  Labs: Lab Results  Component Value Date   REPTSTATUS 02/22/2017 FINAL 02/18/2017   GRAMSTAIN  02/18/2017    RARE WBC PRESENT, PREDOMINANTLY PMN FEW GRAM POSITIVE COCCI    CULT  02/18/2017    NO GROWTH AEROBICALLY MIXED ANAEROBIC FLORA PRESENT.  CALL LAB IF FURTHER IID REQUIRED.      Lab Results  Component Value  Date   ALBUMIN 2.9 (L) 05/12/2017   ALBUMIN 3.0 (L) 05/10/2017   ALBUMIN 3.3 (L) 03/31/2017    There is no height or weight on file to calculate BMI.  Orders:  No orders of the defined types were placed in this encounter.  No orders of the defined types were placed in this encounter.    Procedures: No procedures performed  Clinical Data: No additional findings.  ROS:  All other systems negative, except as noted in the HPI. Review of Systems  Objective: Vital Signs: There were no vitals taken for this visit.  Specialty Comments:  No specialty comments available.  PMFS History: Patient Active Problem List   Diagnosis Date Noted  . S/P transmetatarsal amputation of foot, right ([place]) 05/09/2017  . PVD (peripheral vascular disease) ([place]) 04/15/2017  . Nonischemic cardiomyopathy ([place]) 08/15/2015  . Aortic stenosis 08/15/2015  . Moderate aortic stenosis 07/12/2015  . 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 CAD- 40-50% LAD at cath 07/11/15 07/11/2015  . Aspirin intolerance 07/11/2015  . Abnormal stress test   . ESRD on dialysis ([place]) 05/30/2015  . CTS (carpal tunnel syndrome) 02/28/2015  . PVD of LE - Dr Oneida Alar follows 08/20/2012  . Midfoot skin ulcer, right, limited to breakdown of skin ([place]) 01/16/2012  . Encounter for screening colonoscopy 10/08/2011  . Melena 10/08/2011  .  Other complications due to renal dialysis device, implant, and graft 09/24/2011   Past Medical History:  Diagnosis Date  . Anxiety   . Aortic stenosis   . Arthritis   . CVA (cerebral infarction)   . ESRD (end stage renal disease) on dialysis [facility])    M/W/F at [facility] in [place]  . Essential hypertension   . Gangrene of right foot ([place])   . Gastric ulcer 2004  . History of cardiomyopathy    LVEF normal as of February 2017  . History of stroke    Left side weakness  . Iron deficiency anemia     Family History  Problem Relation Age of Onset  . Hypertension Mother   .  Colon cancer Neg Hx   . Liver disease Neg Hx     Past Surgical History:  Procedure Laterality Date  . ABDOMINAL AORTAGRAM N/A 01/24/2012   Procedure: ABDOMINAL Maxcine Ham;  Surgeon: Elam Dutch, MD;  Location: [facility] CATH LAB;  Service: Cardiovascular;  Laterality: N/A;  . ABDOMINAL AORTOGRAM W/LOWER EXTREMITY N/A 04/15/2017   Procedure: ABDOMINAL AORTOGRAM W/LOWER EXTREMITY;  Surgeon: Angelia Mould, MD;  Location: [place] CV LAB;  Service: Cardiovascular;  Laterality: N/A;  . AMPUTATION Right 05/09/2017   Procedure: RIGHT TRANSMETATARSAL AMPUTATION;  Surgeon: [name], MD;  Location: [place];  Service: Orthopedics;  Laterality: Right;  . ARTERIOVENOUS GRAFT PLACEMENT Right right arm  . AV FISTULA PLACEMENT Left 08/31/2015   Procedure: ARTERIOVENOUS (AV) FISTULA CREATION- LEFT RADIOCEPHALIC;  Surgeon: Mal Misty, MD;  Location: [place];  Service: Vascular;  Laterality: Left;  . AV FISTULA PLACEMENT Right 02/18/2017   Procedure: INSERTION OF ARTERIOVENOUS (AV) GORE-TEX GRAFT  RIGHT UPPER ARM;  Surgeon: Conrad Peoria, MD;  Location: [place];  Service: Vascular;  Laterality: Right;  . BASCILIC VEIN TRANSPOSITION Left 12/19/2015   Procedure: FIRST STAGE BRACHIAL VEIN TRANSPOSITION;  Surgeon: Conrad [place], MD;  Location: [place];  Service: Vascular;  Laterality: Left;  . BASCILIC VEIN TRANSPOSITION Left 02/08/2016   Procedure: SECOND STAGE BRACHIAL VEIN TRANSPOSITION;  Surgeon: Conrad , MD;  Location: [place];  Service: Vascular;  Laterality: Left;  . CARDIAC CATHETERIZATION N/A 07/11/2015   Procedure: Left Heart Cath and Coronary Angiography;  Surgeon: [place] Sine, MD;  Location: [place] CV LAB;  Service: Cardiovascular;  Laterality: N/A;  . Carpel Tunnel Left Dec. 22, 2016  . CHOLECYSTECTOMY    . COLONOSCOPY  2004   Dr. Irving Shows, left sided diverticula and cecal polyp, path unknown  . COLONOSCOPY  10/29/2011   Procedure: COLONOSCOPY;  Surgeon: Daneil Dolin, MD;   [facility];  Service: Endoscopy;  Laterality: N/A;  10:15  . ESOPHAGOGASTRODUODENOSCOPY  11/2002   Dr. Gala Romney, erosive reflux esophagitis, multiple gastric ulcer and antral/bulbar erosions. Serologies positive for H.Pylori and was treated  . ESOPHAGOGASTRODUODENOSCOPY  11/20014   Dr. Gala Romney, small hh only, ulcers healed  . ESOPHAGOGASTRODUODENOSCOPY  09/21/2011   Dr Trevor Iha [facility], antral erosions, ?early GAVE  . FISTULOGRAM Left 12/10/2016   Procedure: THROMBECTOMY OF LEFT ARM ARTERIOVENOUS FISTULA;  Surgeon: Waynetta Sandy, MD;  Location: [place];  Service: Vascular;  Laterality: Left;  . INSERTION OF DIALYSIS CATHETER Left 12/10/2016   Procedure: INSERTION OF TUNNELED DIALYSIS CATHETER;  Surgeon: Waynetta Sandy, MD;  Location: [place];  Service: Vascular;  Laterality: Left;  . IR DIALY SHUNT INTRO [place] W/IMG RIGHT Right 01/01/2018  . IR GENERIC HISTORICAL  07/16/2016  IR REMOVAL TUN CV CATH W/O FL 07/16/2016 Saverio Danker, PA-C [facility]-INTERV RAD  . IR PTA ADDL CENTRAL DIALYSIS SEG THRU DIALY CIRCUIT RIGHT Right 10/21/2017  . IR REMOVAL TUN CV CATH W/O FL  05/12/2017  . IR THROMBECTOMY AV FISTULA W/THROMBOLYSIS/PTA INC/SHUNT/IMG RIGHT Right 10/21/2017  . IR THROMBECTOMY AV FISTULA W/THROMBOLYSIS/PTA INC/SHUNT/IMG RIGHT Right 11/27/2017  . IR US GUIDE VASC ACCESS RIGHT  10/21/2017  . IR US GUIDE VASC ACCESS RIGHT  11/27/2017  . IR US GUIDE VASC ACCESS RIGHT  01/01/2018  . LIGATION OF ARTERIOVENOUS  FISTULA Left 12/19/2015   Procedure: LIGATION OF RADIOCEPHALIC ARTERIOVENOUS  FISTULA;  Surgeon: Conrad Scobey, MD;  Location: [place];  Service: Vascular;  Laterality: Left;  Marland Kitchen MASS EXCISION Right 02/18/2017   Procedure: EXCISION OF RIGHT AXILLARY EPIDERMAL INCLUSION CYST;  Surgeon: Conrad Pascola, MD;  Location: [place];  Service: Vascular;  Laterality: Right;  . PERIPHERAL VASCULAR CATHETERIZATION N/A 12/14/2015   Procedure: Fistulagram;  Surgeon: [place] Beltrami, MD;   Location: [place] CV LAB;  Service: Cardiovascular;  Laterality: N/A;  . SHOULDER SURGERY    . ULTRASOUND GUIDANCE FOR VASCULAR ACCESS  04/15/2017   Procedure: Ultrasound Guidance For Vascular Access;  Surgeon: Angelia Mould, MD;  Location: [place] CV LAB;  Service: Cardiovascular;;  . UPPER EXTREMITY VENOGRAPHY Bilateral 12/17/2016   Procedure: Bilateral Upper Extremity Venography;  Surgeon: Serafina Mitchell, MD;  Location: [place] CV LAB;  Service: Cardiovascular;  Laterality: Bilateral;   Social History   Occupational History  . Occupation: retired, Licensed conveyancer    Employer: RETIRED  Tobacco Use  . Smoking status: Former Smoker    Last attempt to quit: 04/29/2004    Years since quitting: 14.0  . Smokeless tobacco: Former Systems developer    Types: Chew    Quit date: 01/16/1987  . Tobacco comment: quit 2006  Substance and Sexual Activity  . Alcohol use: No  . Drug use: No  . Sexual activity: Not on file

## 2018-05-25 DIAGNOSIS — Z992 Dependence on renal dialysis: Secondary | ICD-10-CM | POA: Diagnosis not present

## 2018-05-25 DIAGNOSIS — D509 Iron deficiency anemia, unspecified: Secondary | ICD-10-CM | POA: Diagnosis not present

## 2018-05-25 DIAGNOSIS — N186 End stage renal disease: Secondary | ICD-10-CM | POA: Diagnosis not present

## 2018-05-25 DIAGNOSIS — N2581 Secondary hyperparathyroidism of renal origin: Secondary | ICD-10-CM | POA: Diagnosis not present

## 2018-05-26 DIAGNOSIS — L98499 Non-pressure chronic ulcer of skin of other sites with unspecified severity: Secondary | ICD-10-CM | POA: Diagnosis not present

## 2018-05-26 DIAGNOSIS — E1165 Type 2 diabetes mellitus with hyperglycemia: Secondary | ICD-10-CM | POA: Diagnosis not present

## 2018-05-26 DIAGNOSIS — I10 Essential (primary) hypertension: Secondary | ICD-10-CM | POA: Diagnosis not present

## 2018-05-26 DIAGNOSIS — L03019 Cellulitis of unspecified finger: Secondary | ICD-10-CM | POA: Diagnosis not present

## 2018-05-27 DIAGNOSIS — Z992 Dependence on renal dialysis: Secondary | ICD-10-CM | POA: Diagnosis not present

## 2018-05-27 DIAGNOSIS — N186 End stage renal disease: Secondary | ICD-10-CM | POA: Diagnosis not present

## 2018-05-27 DIAGNOSIS — N2581 Secondary hyperparathyroidism of renal origin: Secondary | ICD-10-CM | POA: Diagnosis not present

## 2018-05-27 DIAGNOSIS — D509 Iron deficiency anemia, unspecified: Secondary | ICD-10-CM | POA: Diagnosis not present

## 2018-05-29 DIAGNOSIS — Z992 Dependence on renal dialysis: Secondary | ICD-10-CM | POA: Diagnosis not present

## 2018-05-29 DIAGNOSIS — D509 Iron deficiency anemia, unspecified: Secondary | ICD-10-CM | POA: Diagnosis not present

## 2018-05-29 DIAGNOSIS — N2581 Secondary hyperparathyroidism of renal origin: Secondary | ICD-10-CM | POA: Diagnosis not present

## 2018-05-29 DIAGNOSIS — N186 End stage renal disease: Secondary | ICD-10-CM | POA: Diagnosis not present

## 2018-05-30 DIAGNOSIS — Z992 Dependence on renal dialysis: Secondary | ICD-10-CM | POA: Diagnosis not present

## 2018-05-30 DIAGNOSIS — A419 Sepsis, unspecified organism: Secondary | ICD-10-CM | POA: Diagnosis not present

## 2018-05-30 DIAGNOSIS — T82898A Other specified complication of vascular prosthetic devices, implants and grafts, initial encounter: Secondary | ICD-10-CM | POA: Diagnosis not present

## 2018-05-30 DIAGNOSIS — N186 End stage renal disease: Secondary | ICD-10-CM | POA: Diagnosis not present

## 2018-05-30 DIAGNOSIS — I96 Gangrene, not elsewhere classified: Secondary | ICD-10-CM | POA: Diagnosis not present

## 2018-05-30 DIAGNOSIS — N2581 Secondary hyperparathyroidism of renal origin: Secondary | ICD-10-CM | POA: Diagnosis not present

## 2018-05-30 DIAGNOSIS — D509 Iron deficiency anemia, unspecified: Secondary | ICD-10-CM | POA: Diagnosis not present

## 2018-06-01 DIAGNOSIS — N2581 Secondary hyperparathyroidism of renal origin: Secondary | ICD-10-CM | POA: Diagnosis not present

## 2018-06-01 DIAGNOSIS — D509 Iron deficiency anemia, unspecified: Secondary | ICD-10-CM | POA: Diagnosis not present

## 2018-06-01 DIAGNOSIS — A419 Sepsis, unspecified organism: Secondary | ICD-10-CM | POA: Diagnosis not present

## 2018-06-01 DIAGNOSIS — N186 End stage renal disease: Secondary | ICD-10-CM | POA: Diagnosis not present

## 2018-06-01 DIAGNOSIS — T82898A Other specified complication of vascular prosthetic devices, implants and grafts, initial encounter: Secondary | ICD-10-CM | POA: Diagnosis not present

## 2018-06-01 DIAGNOSIS — Z992 Dependence on renal dialysis: Secondary | ICD-10-CM | POA: Diagnosis not present

## 2018-06-03 DIAGNOSIS — D509 Iron deficiency anemia, unspecified: Secondary | ICD-10-CM | POA: Diagnosis not present

## 2018-06-03 DIAGNOSIS — N2581 Secondary hyperparathyroidism of renal origin: Secondary | ICD-10-CM | POA: Diagnosis not present

## 2018-06-03 DIAGNOSIS — A419 Sepsis, unspecified organism: Secondary | ICD-10-CM | POA: Diagnosis not present

## 2018-06-03 DIAGNOSIS — T82898A Other specified complication of vascular prosthetic devices, implants and grafts, initial encounter: Secondary | ICD-10-CM | POA: Diagnosis not present

## 2018-06-03 DIAGNOSIS — N186 End stage renal disease: Secondary | ICD-10-CM | POA: Diagnosis not present

## 2018-06-03 DIAGNOSIS — Z992 Dependence on renal dialysis: Secondary | ICD-10-CM | POA: Diagnosis not present

## 2018-06-04 ENCOUNTER — Encounter: Payer: Self-pay | Admitting: *Deleted

## 2018-06-04 ENCOUNTER — Other Ambulatory Visit: Payer: Self-pay | Admitting: *Deleted

## 2018-06-04 ENCOUNTER — Ambulatory Visit (INDEPENDENT_AMBULATORY_CARE_PROVIDER_SITE_OTHER): Payer: Medicare Other | Admitting: Physician Assistant

## 2018-06-04 ENCOUNTER — Encounter: Payer: Self-pay | Admitting: Family

## 2018-06-04 ENCOUNTER — Other Ambulatory Visit: Payer: Self-pay

## 2018-06-04 VITALS — BP 121/65 | HR 82 | Temp 97.9°F | Resp 18 | Ht 65.0 in | Wt 170.0 lb

## 2018-06-04 DIAGNOSIS — Z992 Dependence on renal dialysis: Secondary | ICD-10-CM

## 2018-06-04 DIAGNOSIS — Z89431 Acquired absence of right foot: Secondary | ICD-10-CM

## 2018-06-04 DIAGNOSIS — I739 Peripheral vascular disease, unspecified: Secondary | ICD-10-CM

## 2018-06-04 DIAGNOSIS — I96 Gangrene, not elsewhere classified: Secondary | ICD-10-CM

## 2018-06-04 DIAGNOSIS — N186 End stage renal disease: Secondary | ICD-10-CM | POA: Diagnosis not present

## 2018-06-04 HISTORY — DX: Gangrene, not elsewhere classified: I96

## 2018-06-04 NOTE — H&P (View-Only) (Signed)
Established Dialysis Access   History of Present Illness   [name]. is a 78 y.o. (04-29-1941) male who presents with 2-week history of right middle finger pain, and swelling.  He is well-known to the VVSs office due to end-stage renal disease with several dialysis access procedures BUE as well as for PAD.  He has had several fistulas created in left arm that have since been abandoned.  Venogram performed by Dr. Trula Slade 11/2016 demonstrated a left central venous occlusion and a patent right central venous system.  Most recent access was right arm AV graft placed by Dr. Bridgett Larsson in October 2018.  He is dialyzing on a Monday Wednesday Friday schedule without complication from this AV graft.  He denies any trauma to his right middle finger.  He states that is causing him pain throughout the day.  He has history of bilateral carpal tunnel release surgery at [facility] however does not remember the surgeon.  He does not take any blood thinners.  He denies fevers, chills, nausea/vomiting.    He also underwent aortogram with bilateral lower extremity runoff in August 2019 by Dr. Scot Dock.  This demonstrated single-vessel runoff via anterior tibial artery with no option of revascularization.  He then underwent transmetatarsal amputation by Dr. Sharol Given who we continues to see for periodic wound care.  Patient however states TMA as well as several other ulcerations on his foot are nearly healed.  He has a follow-up in their office in another several weeks.  Current Outpatient Medications  Medication Sig Dispense Refill  . ALPRAZolam (XANAX) 0.5 MG tablet Take 0.5 mg by mouth daily.  3  . atorvastatin (LIPITOR) 40 MG tablet Take 40 mg by mouth daily.  3  . b complex-vitamin c-folic acid (NEPHRO-VITE) 0.8 MG TABS Take 0.8 mg by mouth See admin instructions. Takes on Tuesdays, Thursdays, Saturdays, and Sundays. Does not take on Mondays, Wednesdays, and Fridays due to Dialysis treatments.    . cinacalcet  (SENSIPAR) 60 MG tablet Take 60 mg by mouth every evening. With evening meal    . HYDROcodone-acetaminophen (NORCO/VICODIN) 5-325 MG tablet Take 1 tablet by mouth every 4 (four) hours as needed for moderate pain. 30 tablet 0  . meclizine (ANTIVERT) 25 MG tablet Take 25 mg by mouth daily as needed for dizziness (Non dialysis days).     . mupirocin ointment (BACTROBAN) 2 % Apply 1 application topically daily. 22 g 6  . mupirocin ointment (BACTROBAN) 2 % Apply 1 application topically daily. 22 g 6  . nitroGLYCERIN (NITRODUR - DOSED IN MG/24 HR) 0.2 mg/hr patch Place 1 patch (0.2 mg total) onto the skin daily. Apply 1 patch to dorsum of foot daily. 30 patch 11  . ondansetron (ZOFRAN) 8 MG tablet Take 1 tablet (8 mg total) by mouth every 8 (eight) hours as needed for nausea or vomiting. 20 tablet 0  . oxyCODONE-acetaminophen (PERCOCET/ROXICET) 5-325 MG tablet Take 1 tablet by mouth every 6 (six) hours as needed for severe pain. 30 tablet 0  . pentoxifylline (TRENTAL) 400 MG CR tablet Take 1 tablet (400 mg total) by mouth 3 (three) times daily with meals. 90 tablet 11  . promethazine (PHENERGAN) 25 MG tablet Take 25 mg by mouth every 8 (eight) hours as needed for nausea or vomiting.     . sevelamer (RENVELA) 800 MG tablet Take 1,600-2,400 mg by mouth See admin instructions. Take 2400  mg by mouth 3 times daily with full meals and take 1600 mg  by mouth with snacks.    . silver sulfADIAZINE (SILVADENE) 1 % cream Apply 1 application topically daily. 50 g 0   No current facility-administered medications for this visit.     On ROS today: 10 system ROS otherwise negative   Physical Examination   Vitals:   06/04/18 1056  BP: 121/65  Pulse: 82  Resp: 18  Temp: 97.9 F (36.6 C)  TempSrc: Oral  SpO2: 98%  Weight: 170 lb (77.1 kg)  Height: 5\' 5"  (1.651 m)   Body mass index is 28.29 kg/m.  General Alert, O x 3, WD, NAD  Pulmonary Sym exp, good B air movt, CTA B  Cardiac Irregular  Vascular  Vessel Right Left  Radial Not palpable however monophasic by Doppler Palpable  Brachial Palpable Palpable  Ulnar Not palpable however monophasic signal by Doppler Faintly palpable    Musculo- skeletal  patient preferred to keep dressing on right transmetatarsal amputation; multiphasic brisk right anterior tibial artery signal by Doppler; right middle finger with dry gangrene fingertip and edematous finger extending over metacarpal; dusky appearing index finger right hand; painful to touch; no drainage or purulence noted; palmar arch signal by Doppler right hand  Neurologic A&O; CN grossly intact      Medical Decision Making   [name]. is a 78 y.o. male who presents with 2-week history of painful swollen gangrenous middle finger and dusky appearing index finger of right hand; also with known PAD and single-vessel runoff right lower extremity   Gangrenous right middle finger and dusky appearing index finger; patient will likely require at least amputation of middle finger  Referral will be made to Hand Surgery  Right arm arteriovenous graft will need to be ligated in order to optimize blood flow and chances of healing of any surgical intervention on right hand  Venogram in August 2018 demonstrated a left central venous occlusion   Plan will be for ligation of right arm AV graft and placement of right IJ tunneled dialysis catheter by Dr. Scot Dock on 06/09/2018; he will then be kept overnight in observation  If discharged the following day he will be able to dialyze at outpatient center; this was confirmed with outpatient kidney center   Patient unwilling to remove dressing of right TMA  Brisk multiphasic signal of right anterior tibial artery suggesting intact single-vessel runoff which was demonstrated by December 2018 arteriogram  Continue current wound care with Dr. Etter Sjogren PA-C [facility] [place] Office: [phone]  Clinic MD:  This patient was seen in conjunction with Dr. Oneida Alar during office visit today

## 2018-06-04 NOTE — Progress Notes (Addendum)
Established Dialysis Access   History of Present Illness   [name]. is a 78 y.o. ([birth year]) male who presents with 2-week history of right middle finger pain, and swelling.  He is well-known to the V[facility] office due to end-stage renal disease with several dialysis access procedures BUE as well as for PAD.  He has had several fistulas created in left arm that have since been abandoned.  Venogram performed by Dr. Trula Slade 11/2016 demonstrated a left central venous occlusion and a patent right central venous system.  Most recent access was right arm AV graft placed by Dr. Bridgett Larsson in October 2018.  He is dialyzing on a Monday Wednesday Friday schedule without complication from this AV graft.  He denies any trauma to his right middle finger.  He states that is causing him pain throughout the day.  He has history of bilateral carpal tunnel release surgery at [facility] however does not remember the surgeon.  He does not take any blood thinners.  He denies fevers, chills, nausea/vomiting.    He also underwent aortogram with bilateral lower extremity runoff in August 2019 by Dr. Scot Dock.  This demonstrated single-vessel runoff via anterior tibial artery with no option of revascularization.  He then underwent transmetatarsal amputation by Dr. Sharol Given who we continues to see for periodic wound care.  Patient however states TMA as well as several other ulcerations on his foot are nearly healed.  He has a follow-up in their office in another several weeks.  Current Outpatient Medications  Medication Sig Dispense Refill  . ALPRAZolam (XANAX) 0.5 MG tablet Take 0.5 mg by mouth daily.  3  . atorvastatin (LIPITOR) 40 MG tablet Take 40 mg by mouth daily.  3  . b complex-vitamin c-folic acid (NEPHRO-VITE) 0.8 MG TABS Take 0.8 mg by mouth See admin instructions. Takes on Tuesdays, Thursdays, Saturdays, and Sundays. Does not take on Mondays, Wednesdays, and Fridays due to Dialysis treatments.    . cinacalcet  (SENSIPAR) 60 MG tablet Take 60 mg by mouth every evening. With evening meal    . HYDROcodone-acetaminophen (NORCO/VICODIN) 5-325 MG tablet Take 1 tablet by mouth every 4 (four) hours as needed for moderate pain. 30 tablet 0  . meclizine (ANTIVERT) 25 MG tablet Take 25 mg by mouth daily as needed for dizziness (Non dialysis days).     . mupirocin ointment (BACTROBAN) 2 % Apply 1 application topically daily. 22 g 6  . mupirocin ointment (BACTROBAN) 2 % Apply 1 application topically daily. 22 g 6  . nitroGLYCERIN (NITRODUR - DOSED IN MG/24 HR) 0.2 mg/hr patch Place 1 patch (0.2 mg total) onto the skin daily. Apply 1 patch to dorsum of foot daily. 30 patch 11  . ondansetron (ZOFRAN) 8 MG tablet Take 1 tablet (8 mg total) by mouth every 8 (eight) hours as needed for nausea or vomiting. 20 tablet 0  . oxyCODONE-acetaminophen (PERCOCET/ROXICET) 5-325 MG tablet Take 1 tablet by mouth every 6 (six) hours as needed for severe pain. 30 tablet 0  . pentoxifylline (TRENTAL) 400 MG CR tablet Take 1 tablet (400 mg total) by mouth 3 (three) times daily with meals. 90 tablet 11  . promethazine (PHENERGAN) 25 MG tablet Take 25 mg by mouth every 8 (eight) hours as needed for nausea or vomiting.     . sevelamer (RENVELA) 800 MG tablet Take 1,600-2,400 mg by mouth See admin instructions. Take 2400  mg by mouth 3 times daily with full meals and take 1600 mg  by mouth with snacks.    . silver sulfADIAZINE (SILVADENE) 1 % cream Apply 1 application topically daily. 50 g 0   No current facility-administered medications for this visit.     On ROS today: 10 system ROS otherwise negative   Physical Examination   Vitals:   06/04/18 1056  BP: 121/65  Pulse: 82  Resp: 18  Temp: 97.9 F (36.6 C)  TempSrc: Oral  SpO2: 98%  Weight: 170 lb (77.1 kg)  Height: 5\' 5"  (1.651 m)   Body mass index is 28.29 kg/m.  General Alert, O x 3, WD, NAD  Pulmonary Sym exp, good B air movt, CTA B  Cardiac Irregular  Vascular  Vessel Right Left  Radial Not palpable however monophasic by Doppler Palpable  Brachial Palpable Palpable  Ulnar Not palpable however monophasic signal by Doppler Faintly palpable    Musculo- skeletal  patient preferred to keep dressing on right transmetatarsal amputation; multiphasic brisk right anterior tibial artery signal by Doppler; right middle finger with dry gangrene fingertip and edematous finger extending over metacarpal; dusky appearing index finger right hand; painful to touch; no drainage or purulence noted; palmar arch signal by Doppler right hand  Neurologic A&O; CN grossly intact      Medical Decision Making   [name]. is a 78 y.o. male who presents with 2-week history of painful swollen gangrenous middle finger and dusky appearing index finger of right hand; also with known PAD and single-vessel runoff right lower extremity   Gangrenous right middle finger and dusky appearing index finger; patient will likely require at least amputation of middle finger  Referral will be made to Hand Surgery  Right arm arteriovenous graft will need to be ligated in order to optimize blood flow and chances of healing of any surgical intervention on right hand  Venogram in August 2018 demonstrated a left central venous occlusion   Plan will be for ligation of right arm AV graft and placement of right IJ tunneled dialysis catheter by Dr. Scot Dock on 06/09/2018; he will then be kept overnight in observation  If discharged the following day he will be able to dialyze at outpatient center; this was confirmed with outpatient kidney center   Patient unwilling to remove dressing of right TMA  Brisk multiphasic signal of right anterior tibial artery suggesting intact single-vessel runoff which was demonstrated by December 2018 arteriogram  Continue current wound care with Dr. Etter Sjogren PA-C [facility] [place] Office: [phone]  Clinic MD:  This patient was seen in conjunction with Dr. Oneida Alar during office visit today

## 2018-06-05 DIAGNOSIS — N2581 Secondary hyperparathyroidism of renal origin: Secondary | ICD-10-CM | POA: Diagnosis not present

## 2018-06-05 DIAGNOSIS — Z992 Dependence on renal dialysis: Secondary | ICD-10-CM | POA: Diagnosis not present

## 2018-06-05 DIAGNOSIS — N186 End stage renal disease: Secondary | ICD-10-CM | POA: Diagnosis not present

## 2018-06-05 DIAGNOSIS — T82898A Other specified complication of vascular prosthetic devices, implants and grafts, initial encounter: Secondary | ICD-10-CM | POA: Diagnosis not present

## 2018-06-05 DIAGNOSIS — D509 Iron deficiency anemia, unspecified: Secondary | ICD-10-CM | POA: Diagnosis not present

## 2018-06-05 DIAGNOSIS — A419 Sepsis, unspecified organism: Secondary | ICD-10-CM | POA: Diagnosis not present

## 2018-06-06 ENCOUNTER — Other Ambulatory Visit: Payer: Self-pay

## 2018-06-06 ENCOUNTER — Encounter (HOSPITAL_COMMUNITY): Payer: Self-pay | Admitting: *Deleted

## 2018-06-06 DIAGNOSIS — I350 Nonrheumatic aortic (valve) stenosis: Secondary | ICD-10-CM | POA: Diagnosis present

## 2018-06-06 DIAGNOSIS — Z87891 Personal history of nicotine dependence: Secondary | ICD-10-CM

## 2018-06-06 DIAGNOSIS — L03011 Cellulitis of right finger: Secondary | ICD-10-CM | POA: Diagnosis not present

## 2018-06-06 DIAGNOSIS — E1122 Type 2 diabetes mellitus with diabetic chronic kidney disease: Secondary | ICD-10-CM | POA: Diagnosis present

## 2018-06-06 DIAGNOSIS — T82898A Other specified complication of vascular prosthetic devices, implants and grafts, initial encounter: Principal | ICD-10-CM | POA: Diagnosis present

## 2018-06-06 DIAGNOSIS — Z79891 Long term (current) use of opiate analgesic: Secondary | ICD-10-CM

## 2018-06-06 DIAGNOSIS — N186 End stage renal disease: Secondary | ICD-10-CM | POA: Diagnosis present

## 2018-06-06 DIAGNOSIS — A4102 Sepsis due to Methicillin resistant Staphylococcus aureus: Secondary | ICD-10-CM | POA: Diagnosis not present

## 2018-06-06 DIAGNOSIS — Z9049 Acquired absence of other specified parts of digestive tract: Secondary | ICD-10-CM

## 2018-06-06 DIAGNOSIS — D509 Iron deficiency anemia, unspecified: Secondary | ICD-10-CM | POA: Diagnosis present

## 2018-06-06 DIAGNOSIS — Y832 Surgical operation with anastomosis, bypass or graft as the cause of abnormal reaction of the patient, or of later complication, without mention of misadventure at the time of the procedure: Secondary | ICD-10-CM | POA: Diagnosis present

## 2018-06-06 DIAGNOSIS — L02511 Cutaneous abscess of right hand: Secondary | ICD-10-CM | POA: Diagnosis present

## 2018-06-06 DIAGNOSIS — I2510 Atherosclerotic heart disease of native coronary artery without angina pectoris: Secondary | ICD-10-CM | POA: Diagnosis present

## 2018-06-06 DIAGNOSIS — Z79899 Other long term (current) drug therapy: Secondary | ICD-10-CM

## 2018-06-06 DIAGNOSIS — D631 Anemia in chronic kidney disease: Secondary | ICD-10-CM | POA: Diagnosis present

## 2018-06-06 DIAGNOSIS — F419 Anxiety disorder, unspecified: Secondary | ICD-10-CM | POA: Diagnosis present

## 2018-06-06 DIAGNOSIS — Z89421 Acquired absence of other right toe(s): Secondary | ICD-10-CM

## 2018-06-06 DIAGNOSIS — B9562 Methicillin resistant Staphylococcus aureus infection as the cause of diseases classified elsewhere: Secondary | ICD-10-CM | POA: Diagnosis present

## 2018-06-06 DIAGNOSIS — E1152 Type 2 diabetes mellitus with diabetic peripheral angiopathy with gangrene: Secondary | ICD-10-CM | POA: Diagnosis present

## 2018-06-06 DIAGNOSIS — Z886 Allergy status to analgesic agent status: Secondary | ICD-10-CM

## 2018-06-06 DIAGNOSIS — I69354 Hemiplegia and hemiparesis following cerebral infarction affecting left non-dominant side: Secondary | ICD-10-CM

## 2018-06-06 DIAGNOSIS — Z992 Dependence on renal dialysis: Secondary | ICD-10-CM

## 2018-06-06 DIAGNOSIS — I120 Hypertensive chronic kidney disease with stage 5 chronic kidney disease or end stage renal disease: Secondary | ICD-10-CM | POA: Diagnosis present

## 2018-06-06 DIAGNOSIS — I428 Other cardiomyopathies: Secondary | ICD-10-CM | POA: Diagnosis present

## 2018-06-06 DIAGNOSIS — Z8249 Family history of ischemic heart disease and other diseases of the circulatory system: Secondary | ICD-10-CM

## 2018-06-06 NOTE — Progress Notes (Signed)
Denies chest pain, shob, or cardiology visit.

## 2018-06-08 ENCOUNTER — Encounter (HOSPITAL_COMMUNITY): Payer: Self-pay | Admitting: Anesthesiology

## 2018-06-08 DIAGNOSIS — A419 Sepsis, unspecified organism: Secondary | ICD-10-CM | POA: Diagnosis not present

## 2018-06-08 DIAGNOSIS — N186 End stage renal disease: Secondary | ICD-10-CM | POA: Diagnosis not present

## 2018-06-08 DIAGNOSIS — N2581 Secondary hyperparathyroidism of renal origin: Secondary | ICD-10-CM | POA: Diagnosis not present

## 2018-06-08 DIAGNOSIS — T82898A Other specified complication of vascular prosthetic devices, implants and grafts, initial encounter: Secondary | ICD-10-CM | POA: Diagnosis not present

## 2018-06-08 DIAGNOSIS — D509 Iron deficiency anemia, unspecified: Secondary | ICD-10-CM | POA: Diagnosis not present

## 2018-06-08 DIAGNOSIS — Z992 Dependence on renal dialysis: Secondary | ICD-10-CM | POA: Diagnosis not present

## 2018-06-08 NOTE — Anesthesia Preprocedure Evaluation (Addendum)
Anesthesia Evaluation  Patient identified by MRN, date of birth, ID band Patient awake    Reviewed: Allergy & Precautions, NPO status , Patient's Chart, lab work & pertinent test results  Airway Mallampati: II  TM Distance: >3 FB Neck ROM: Full    Dental  (+) Poor Dentition   Pulmonary former smoker,    Pulmonary exam normal breath sounds clear to auscultation       Cardiovascular hypertension, Pt. on medications + CAD and + Peripheral Vascular Disease  Normal cardiovascular exam+ Valvular Problems/Murmurs AS  Rhythm:Regular Rate:Normal  Steal syndrome right arm Non ischemic CM. 45% lesion mid LAD LVEF 45% Moderate AS Hx/o gangrene right foot S/P TMA   Neuro/Psych Anxiety Left side weakness  Neuromuscular disease CVA, Residual Symptoms    GI/Hepatic PUD,   Endo/Other  negative endocrine ROSHyperlipidemia  Renal/GU ESRF and DialysisRenal diseaseDialysis M,W,F  negative genitourinary   Musculoskeletal  (+) Arthritis , Osteoarthritis,    Abdominal   Peds  Hematology  (+) anemia ,   Anesthesia Other Findings Gangrene right distal middle finger  Reproductive/Obstetrics                           Anesthesia Physical Anesthesia Plan  ASA: IV  Anesthesia Plan: MAC   Post-op Pain Management:    Induction: Intravenous  PONV Risk Score and Plan: 2 and Ondansetron, Propofol infusion and Treatment may vary due to age or medical condition  Airway Management Planned: Natural Airway, Simple Face Mask and Nasal Cannula  Additional Equipment:   Intra-op Plan:   Post-operative Plan:   Informed Consent: I have reviewed the patients History and Physical, chart, labs and discussed the procedure including the risks, benefits and alternatives for the proposed anesthesia with the patient or authorized representative who has indicated his/her understanding and acceptance.     Dental advisory  given  Plan Discussed with: CRNA and Surgeon  Anesthesia Plan Comments:        Anesthesia Quick Evaluation

## 2018-06-09 ENCOUNTER — Encounter (HOSPITAL_COMMUNITY)
Admission: RE | Disposition: A | Discharge status: Home / Self Care | Payer: Self-pay | Source: Home / Self Care | Attending: Vascular Surgery

## 2018-06-09 ENCOUNTER — Other Ambulatory Visit: Payer: Self-pay

## 2018-06-09 ENCOUNTER — Ambulatory Visit (HOSPITAL_COMMUNITY): Payer: Medicare Other | Admitting: Registered Nurse

## 2018-06-09 ENCOUNTER — Observation Stay (HOSPITAL_COMMUNITY)
Admission: RE | Admit: 2018-06-09 | Discharge: 2018-06-10 | Disposition: A | Discharge status: Home / Self Care | Payer: Medicare Other | Source: Home / Self Care | Attending: Vascular Surgery | Admitting: Vascular Surgery

## 2018-06-09 ENCOUNTER — Observation Stay (HOSPITAL_COMMUNITY): Payer: Medicare Other

## 2018-06-09 ENCOUNTER — Encounter (HOSPITAL_COMMUNITY): Payer: Self-pay | Admitting: *Deleted

## 2018-06-09 ENCOUNTER — Ambulatory Visit (HOSPITAL_COMMUNITY): Payer: Medicare Other

## 2018-06-09 DIAGNOSIS — N186 End stage renal disease: Secondary | ICD-10-CM | POA: Diagnosis not present

## 2018-06-09 DIAGNOSIS — I2510 Atherosclerotic heart disease of native coronary artery without angina pectoris: Secondary | ICD-10-CM

## 2018-06-09 DIAGNOSIS — Z89431 Acquired absence of right foot: Secondary | ICD-10-CM | POA: Insufficient documentation

## 2018-06-09 DIAGNOSIS — I120 Hypertensive chronic kidney disease with stage 5 chronic kidney disease or end stage renal disease: Secondary | ICD-10-CM | POA: Insufficient documentation

## 2018-06-09 DIAGNOSIS — A4102 Sepsis due to Methicillin resistant Staphylococcus aureus: Secondary | ICD-10-CM | POA: Diagnosis not present

## 2018-06-09 DIAGNOSIS — Z79899 Other long term (current) drug therapy: Secondary | ICD-10-CM | POA: Insufficient documentation

## 2018-06-09 DIAGNOSIS — Z992 Dependence on renal dialysis: Secondary | ICD-10-CM

## 2018-06-09 DIAGNOSIS — I96 Gangrene, not elsewhere classified: Secondary | ICD-10-CM | POA: Diagnosis not present

## 2018-06-09 DIAGNOSIS — E1152 Type 2 diabetes mellitus with diabetic peripheral angiopathy with gangrene: Secondary | ICD-10-CM | POA: Diagnosis not present

## 2018-06-09 DIAGNOSIS — I69351 Hemiplegia and hemiparesis following cerebral infarction affecting right dominant side: Secondary | ICD-10-CM

## 2018-06-09 DIAGNOSIS — Y832 Surgical operation with anastomosis, bypass or graft as the cause of abnormal reaction of the patient, or of later complication, without mention of misadventure at the time of the procedure: Secondary | ICD-10-CM

## 2018-06-09 DIAGNOSIS — Z09 Encounter for follow-up examination after completed treatment for conditions other than malignant neoplasm: Secondary | ICD-10-CM

## 2018-06-09 DIAGNOSIS — M7989 Other specified soft tissue disorders: Secondary | ICD-10-CM | POA: Diagnosis not present

## 2018-06-09 DIAGNOSIS — R42 Dizziness and giddiness: Secondary | ICD-10-CM | POA: Diagnosis not present

## 2018-06-09 DIAGNOSIS — I739 Peripheral vascular disease, unspecified: Secondary | ICD-10-CM

## 2018-06-09 DIAGNOSIS — T82898A Other specified complication of vascular prosthetic devices, implants and grafts, initial encounter: Secondary | ICD-10-CM | POA: Diagnosis not present

## 2018-06-09 DIAGNOSIS — M79641 Pain in right hand: Secondary | ICD-10-CM | POA: Diagnosis not present

## 2018-06-09 DIAGNOSIS — L02511 Cutaneous abscess of right hand: Secondary | ICD-10-CM | POA: Diagnosis not present

## 2018-06-09 DIAGNOSIS — A419 Sepsis, unspecified organism: Secondary | ICD-10-CM | POA: Diagnosis not present

## 2018-06-09 DIAGNOSIS — Z87891 Personal history of nicotine dependence: Secondary | ICD-10-CM

## 2018-06-09 DIAGNOSIS — D631 Anemia in chronic kidney disease: Secondary | ICD-10-CM | POA: Diagnosis not present

## 2018-06-09 DIAGNOSIS — I428 Other cardiomyopathies: Secondary | ICD-10-CM | POA: Diagnosis not present

## 2018-06-09 DIAGNOSIS — E1122 Type 2 diabetes mellitus with diabetic chronic kidney disease: Secondary | ICD-10-CM | POA: Diagnosis not present

## 2018-06-09 DIAGNOSIS — Z452 Encounter for adjustment and management of vascular access device: Secondary | ICD-10-CM | POA: Diagnosis not present

## 2018-06-09 DIAGNOSIS — D509 Iron deficiency anemia, unspecified: Secondary | ICD-10-CM | POA: Diagnosis not present

## 2018-06-09 DIAGNOSIS — L03011 Cellulitis of right finger: Secondary | ICD-10-CM | POA: Diagnosis not present

## 2018-06-09 DIAGNOSIS — I69354 Hemiplegia and hemiparesis following cerebral infarction affecting left non-dominant side: Secondary | ICD-10-CM | POA: Diagnosis not present

## 2018-06-09 HISTORY — PX: INSERTION OF DIALYSIS CATHETER: SHX1324

## 2018-06-09 HISTORY — PX: LIGATION ARTERIOVENOUS GORTEX GRAFT: SHX5947

## 2018-06-09 LAB — MRSA PCR SCREENING: MRSA by PCR: POSITIVE — AB

## 2018-06-09 LAB — CBC
HCT: 44.3 % (ref 39.0–52.0)
Hemoglobin: 14.3 g/dL (ref 13.0–17.0)
MCH: 20.5 pg — AB (ref 26.0–34.0)
MCHC: 32.3 g/dL (ref 30.0–36.0)
MCV: 63.6 fL — ABNORMAL LOW (ref 80.0–100.0)
Platelets: ADEQUATE 10*3/uL (ref 150–400)
RBC: 6.96 MIL/uL — ABNORMAL HIGH (ref 4.22–5.81)
RDW: 18.7 % — ABNORMAL HIGH (ref 11.5–15.5)
WBC: 26.4 10*3/uL — ABNORMAL HIGH (ref 4.0–10.5)
nRBC: 0 % (ref 0.0–0.2)

## 2018-06-09 LAB — CREATININE, SERUM
Creatinine, Ser: 8.08 mg/dL — ABNORMAL HIGH (ref 0.61–1.24)
GFR calc Af Amer: 7 mL/min — ABNORMAL LOW (ref >60)
GFR calc non Af Amer: 6 mL/min — ABNORMAL LOW (ref >60)

## 2018-06-09 LAB — POCT I-STAT 4, (NA,K, GLUC, HGB,HCT)
Glucose, Bld: 90 mg/dL (ref 70–99)
HCT: 45 % (ref 39.0–52.0)
Hemoglobin: 15.3 g/dL (ref 13.0–17.0)
POTASSIUM: 3.6 mmol/L (ref 3.5–5.1)
SODIUM: 136 mmol/L (ref 135–145)

## 2018-06-09 SURGERY — LIGATION ARTERIOVENOUS GORTEX GRAFT
Anesthesia: Monitor Anesthesia Care | Site: Chest | Laterality: Right

## 2018-06-09 MED ORDER — PHENYLEPHRINE 40 MCG/ML (10ML) SYRINGE FOR IV PUSH (FOR BLOOD PRESSURE SUPPORT)
PREFILLED_SYRINGE | INTRAVENOUS | Status: DC | PRN
  Administered 2018-06-09: 120 ug via INTRAVENOUS
  Administered 2018-06-09: 40 ug via INTRAVENOUS
  Administered 2018-06-09 (×2): 120 ug via INTRAVENOUS

## 2018-06-09 MED ORDER — ALUM & MAG HYDROXIDE-SIMETH 200-200-20 MG/5ML PO SUSP: 15.0000 mL | ORAL | Status: DC | PRN

## 2018-06-09 MED ORDER — LIDOCAINE-EPINEPHRINE (PF) 1 %-1:200000 IJ SOLN
INTRAMUSCULAR | Status: DC | PRN
  Administered 2018-06-09: 30 mL

## 2018-06-09 MED ORDER — SODIUM CHLORIDE 0.9 % IV SOLN
INTRAVENOUS | Status: AC
  Filled 2018-06-09: qty 1.2

## 2018-06-09 MED ORDER — LIDOCAINE 2% (20 MG/ML) 5 ML SYRINGE
INTRAMUSCULAR | Status: DC | PRN
  Administered 2018-06-09: 60 mg via INTRAVENOUS

## 2018-06-09 MED ORDER — FENTANYL CITRATE (PF) 250 MCG/5ML IJ SOLN
INTRAMUSCULAR | Status: AC
  Filled 2018-06-09: qty 5

## 2018-06-09 MED ORDER — ONDANSETRON HCL 4 MG/2ML IJ SOLN
INTRAMUSCULAR | Status: AC
  Filled 2018-06-09: qty 2

## 2018-06-09 MED ORDER — SEVELAMER CARBONATE 800 MG PO TABS
3200.0000 mg | ORAL_TABLET | Freq: Three times a day (TID) | ORAL | Status: DC
  Administered 2018-06-09 – 2018-06-10 (×2): 3200 mg via ORAL
  Filled 2018-06-09 (×2): qty 4

## 2018-06-09 MED ORDER — ATORVASTATIN CALCIUM 40 MG PO TABS
40.0000 mg | ORAL_TABLET | Freq: Every day | ORAL | Status: DC
  Administered 2018-06-09: 40 mg via ORAL
  Filled 2018-06-09: qty 1

## 2018-06-09 MED ORDER — POTASSIUM CHLORIDE CRYS ER 20 MEQ PO TBCR
20.0000 meq | EXTENDED_RELEASE_TABLET | Freq: Once | ORAL | Status: AC
  Administered 2018-06-09: 20 meq via ORAL
  Filled 2018-06-09: qty 1

## 2018-06-09 MED ORDER — CINACALCET HCL 30 MG PO TABS
60.0000 mg | ORAL_TABLET | Freq: Every evening | ORAL | Status: DC
  Administered 2018-06-09: 60 mg via ORAL
  Filled 2018-06-09: qty 2

## 2018-06-09 MED ORDER — TRAMADOL HCL 50 MG PO TABS
50.0000 mg | ORAL_TABLET | Freq: Three times a day (TID) | ORAL | Status: DC | PRN
  Administered 2018-06-09: 50 mg via ORAL
  Filled 2018-06-09: qty 1

## 2018-06-09 MED ORDER — 0.9 % SODIUM CHLORIDE (POUR BTL) OPTIME
TOPICAL | Status: DC | PRN
  Administered 2018-06-09: 1000 mL

## 2018-06-09 MED ORDER — CEFAZOLIN SODIUM-DEXTROSE 2-4 GM/100ML-% IV SOLN
2.0000 g | INTRAVENOUS | Status: AC
  Administered 2018-06-09: 2 g via INTRAVENOUS
  Filled 2018-06-09: qty 100

## 2018-06-09 MED ORDER — PHENOL 1.4 % MT LIQD: 1.0000 | OROMUCOSAL | Status: DC | PRN

## 2018-06-09 MED ORDER — GUAIFENESIN-DM 100-10 MG/5ML PO SYRP: 15.0000 mL | ORAL_SOLUTION | ORAL | Status: DC | PRN

## 2018-06-09 MED ORDER — LIDOCAINE 2% (20 MG/ML) 5 ML SYRINGE
INTRAMUSCULAR | Status: AC
  Filled 2018-06-09: qty 5

## 2018-06-09 MED ORDER — OXYCODONE-ACETAMINOPHEN 5-325 MG PO TABS
1.0000 | ORAL_TABLET | ORAL | Status: DC | PRN
  Administered 2018-06-10: 2 via ORAL
  Filled 2018-06-09: qty 2

## 2018-06-09 MED ORDER — SODIUM CHLORIDE 0.9 % IV SOLN
INTRAVENOUS | Status: DC | PRN
  Administered 2018-06-09: 30 ug/min via INTRAVENOUS

## 2018-06-09 MED ORDER — ONDANSETRON HCL 4 MG/2ML IJ SOLN: 4.0000 mg | Freq: Once | INTRAMUSCULAR | Status: DC | PRN

## 2018-06-09 MED ORDER — FENTANYL CITRATE (PF) 100 MCG/2ML IJ SOLN
INTRAMUSCULAR | Status: DC | PRN
  Administered 2018-06-09: 25 ug via INTRAVENOUS

## 2018-06-09 MED ORDER — DEXAMETHASONE SODIUM PHOSPHATE 10 MG/ML IJ SOLN
INTRAMUSCULAR | Status: AC
  Filled 2018-06-09: qty 1

## 2018-06-09 MED ORDER — IBUPROFEN 600 MG PO TABS: 600.0000 mg | ORAL_TABLET | Freq: Four times a day (QID) | ORAL | Status: DC | PRN

## 2018-06-09 MED ORDER — METOPROLOL TARTRATE 5 MG/5ML IV SOLN: 2.0000 mg | INTRAVENOUS | Status: DC | PRN

## 2018-06-09 MED ORDER — SODIUM CHLORIDE 0.9 % IV SOLN
INTRAVENOUS | Status: DC | PRN
  Administered 2018-06-09: 500 mL

## 2018-06-09 MED ORDER — PROPOFOL 500 MG/50ML IV EMUL
INTRAVENOUS | Status: DC | PRN
  Administered 2018-06-09: 75 ug/kg/min via INTRAVENOUS

## 2018-06-09 MED ORDER — LIDOCAINE HCL (PF) 1 % IJ SOLN
INTRAMUSCULAR | Status: AC
  Filled 2018-06-09: qty 30

## 2018-06-09 MED ORDER — HEPARIN SODIUM (PORCINE) 1000 UNIT/ML IJ SOLN
INTRAMUSCULAR | Status: AC
  Filled 2018-06-09: qty 1

## 2018-06-09 MED ORDER — DEXAMETHASONE SODIUM PHOSPHATE 10 MG/ML IJ SOLN
INTRAMUSCULAR | Status: DC | PRN
  Administered 2018-06-09: 5 mg via INTRAVENOUS

## 2018-06-09 MED ORDER — SODIUM CHLORIDE 0.9 % IV SOLN
INTRAVENOUS | Status: DC | PRN
  Administered 2018-06-09: 07:00:00 via INTRAVENOUS

## 2018-06-09 MED ORDER — PROPOFOL 1000 MG/100ML IV EMUL
INTRAVENOUS | Status: AC
  Filled 2018-06-09: qty 200

## 2018-06-09 MED ORDER — ONDANSETRON HCL 4 MG/2ML IJ SOLN: 4.0000 mg | Freq: Four times a day (QID) | INTRAMUSCULAR | Status: DC | PRN

## 2018-06-09 MED ORDER — HEPARIN SODIUM (PORCINE) 1000 UNIT/ML IJ SOLN
INTRAMUSCULAR | Status: DC | PRN
  Administered 2018-06-09: 1000 [IU]

## 2018-06-09 MED ORDER — ENOXAPARIN SODIUM 30 MG/0.3ML ~~LOC~~ SOLN
30.0000 mg | SUBCUTANEOUS | Status: DC
  Administered 2018-06-09: 30 mg via SUBCUTANEOUS
  Filled 2018-06-09: qty 0.3

## 2018-06-09 MED ORDER — PHENYLEPHRINE 40 MCG/ML (10ML) SYRINGE FOR IV PUSH (FOR BLOOD PRESSURE SUPPORT)
PREFILLED_SYRINGE | INTRAVENOUS | Status: AC
  Filled 2018-06-09: qty 10

## 2018-06-09 MED ORDER — LIDOCAINE-EPINEPHRINE (PF) 1 %-1:200000 IJ SOLN
INTRAMUSCULAR | Status: AC
  Filled 2018-06-09: qty 30

## 2018-06-09 MED ORDER — PANTOPRAZOLE SODIUM 40 MG PO TBEC
40.0000 mg | DELAYED_RELEASE_TABLET | Freq: Every day | ORAL | Status: DC
  Administered 2018-06-09: 40 mg via ORAL
  Filled 2018-06-09: qty 1

## 2018-06-09 MED ORDER — ONDANSETRON HCL 4 MG/2ML IJ SOLN
INTRAMUSCULAR | Status: DC | PRN
  Administered 2018-06-09: 4 mg via INTRAVENOUS

## 2018-06-09 MED ORDER — LABETALOL HCL 5 MG/ML IV SOLN: 10.0000 mg | INTRAVENOUS | Status: DC | PRN

## 2018-06-09 MED ORDER — RENA-VITE PO TABS
1.0000 | ORAL_TABLET | Freq: Every day | ORAL | Status: DC
  Administered 2018-06-09: 1 via ORAL
  Filled 2018-06-09: qty 1

## 2018-06-09 MED ORDER — PROPOFOL 10 MG/ML IV BOLUS
INTRAVENOUS | Status: DC | PRN
  Administered 2018-06-09: 20 mg via INTRAVENOUS

## 2018-06-09 MED ORDER — HYDRALAZINE HCL 20 MG/ML IJ SOLN: 5.0000 mg | INTRAMUSCULAR | Status: DC | PRN

## 2018-06-09 MED ORDER — FENTANYL CITRATE (PF) 100 MCG/2ML IJ SOLN: 25.0000 ug | INTRAMUSCULAR | Status: DC | PRN

## 2018-06-09 MED ORDER — SODIUM CHLORIDE 0.9 % IV SOLN: INTRAVENOUS | Status: DC

## 2018-06-09 SURGICAL SUPPLY — 52 items
ADH SKN CLS APL DERMABOND .7 (GAUZE/BANDAGES/DRESSINGS) ×4
BAG DECANTER FOR FLEXI CONT (MISCELLANEOUS) ×4 IMPLANT
BIOPATCH RED 1 DISK 7.0 (GAUZE/BANDAGES/DRESSINGS) ×3 IMPLANT
BIOPATCH RED 1IN DISK 7.0MM (GAUZE/BANDAGES/DRESSINGS) ×1
CANISTER SUCT 3000ML PPV (MISCELLANEOUS) ×4 IMPLANT
CATH PALINDROME RT-P 15FX19CM (CATHETERS) ×2 IMPLANT
CATH PALINDROME RT-P 15FX23CM (CATHETERS) IMPLANT
CATH PALINDROME RT-P 15FX28CM (CATHETERS) IMPLANT
CATH PALINDROME RT-P 15FX55CM (CATHETERS) IMPLANT
CHLORAPREP W/TINT 26ML (MISCELLANEOUS) ×4 IMPLANT
COVER PROBE W GEL 5X96 (DRAPES) IMPLANT
COVER SURGICAL LIGHT HANDLE (MISCELLANEOUS) ×4 IMPLANT
COVER WAND RF STERILE (DRAPES) ×4 IMPLANT
DERMABOND ADVANCED (GAUZE/BANDAGES/DRESSINGS) ×4
DERMABOND ADVANCED .7 DNX12 (GAUZE/BANDAGES/DRESSINGS) ×2 IMPLANT
DRAPE C-ARM 42X72 X-RAY (DRAPES) ×4 IMPLANT
DRAPE CHEST BREAST 15X10 FENES (DRAPES) ×4 IMPLANT
DRSG COVADERM 4X6 (GAUZE/BANDAGES/DRESSINGS) ×2 IMPLANT
ELECT REM PT RETURN 9FT ADLT (ELECTROSURGICAL) ×4
ELECTRODE REM PT RTRN 9FT ADLT (ELECTROSURGICAL) ×2 IMPLANT
GAUZE 4X4 16PLY RFD (DISPOSABLE) ×4 IMPLANT
GAUZE SPONGE 4X4 12PLY STRL (GAUZE/BANDAGES/DRESSINGS) ×4 IMPLANT
GLOVE BIO SURGEON STRL SZ7.5 (GLOVE) ×6 IMPLANT
GLOVE BIOGEL PI IND STRL 8 (GLOVE) ×2 IMPLANT
GLOVE BIOGEL PI INDICATOR 8 (GLOVE) ×4
GOWN STRL REUS W/ TWL LRG LVL3 (GOWN DISPOSABLE) ×6 IMPLANT
GOWN STRL REUS W/TWL LRG LVL3 (GOWN DISPOSABLE) ×20
KIT BASIN OR (CUSTOM PROCEDURE TRAY) ×4 IMPLANT
KIT TURNOVER KIT B (KITS) ×4 IMPLANT
NDL 18GX1X1/2 (RX/OR ONLY) (NEEDLE) ×2 IMPLANT
NDL HYPO 25GX1X1/2 BEV (NEEDLE) ×2 IMPLANT
NEEDLE 18GX1X1/2 (RX/OR ONLY) (NEEDLE) ×4 IMPLANT
NEEDLE HYPO 25GX1X1/2 BEV (NEEDLE) ×4 IMPLANT
NS IRRIG 1000ML POUR BTL (IV SOLUTION) ×4 IMPLANT
PACK CV ACCESS (CUSTOM PROCEDURE TRAY) ×4 IMPLANT
PACK SURGICAL SETUP 50X90 (CUSTOM PROCEDURE TRAY) ×4 IMPLANT
PAD ARMBOARD 7.5X6 YLW CONV (MISCELLANEOUS) ×8 IMPLANT
SPONGE SURGIFOAM ABS GEL 100 (HEMOSTASIS) IMPLANT
SUT ETHILON 3 0 PS 1 (SUTURE) ×4 IMPLANT
SUT PROLENE 6 0 BV (SUTURE) IMPLANT
SUT SILK 0 TIES 10X30 (SUTURE) ×4 IMPLANT
SUT VIC AB 3-0 SH 27 (SUTURE) ×4
SUT VIC AB 3-0 SH 27X BRD (SUTURE) ×2 IMPLANT
SUT VICRYL 4-0 PS2 18IN ABS (SUTURE) ×6 IMPLANT
SYR 10ML LL (SYRINGE) ×4 IMPLANT
SYR 20CC LL (SYRINGE) ×8 IMPLANT
SYR 5ML LL (SYRINGE) ×8 IMPLANT
SYR CONTROL 10ML LL (SYRINGE) ×4 IMPLANT
TOWEL GREEN STERILE (TOWEL DISPOSABLE) ×8 IMPLANT
TOWEL GREEN STERILE FF (TOWEL DISPOSABLE) ×4 IMPLANT
UNDERPAD 30X30 (UNDERPADS AND DIAPERS) ×4 IMPLANT
WATER STERILE IRR 1000ML POUR (IV SOLUTION) ×4 IMPLANT

## 2018-06-09 NOTE — Transfer of Care (Signed)
Immediate Anesthesia Transfer of Care Note  Patient: Joseph ALICEA Sr.  Procedure(s) Performed: LIGATION ARTERIOVENOUS GORTEX GRAFT RIGHT ARM (Right Arm Upper) INSERTION OF DIALYSIS CATHETER Right subclavian (Right Chest)  Patient Location: PACU  Anesthesia Type:MAC  Level of Consciousness: awake, alert  and oriented  Airway & Oxygen Therapy: Patient Spontanous Breathing  Post-op Assessment: Report given to RN and Post -op Vital signs reviewed and stable  Post vital signs: Reviewed and stable  Last Vitals:  Vitals Value Taken Time  BP 126/61 06/09/2018  8:53 AM  Temp    Pulse    Resp 12 06/09/2018  8:55 AM  SpO2    Vitals shown include unvalidated device data.  Last Pain:  Vitals:   06/09/18 0642  TempSrc:   PainSc: 8       Patients Stated Pain Goal: 10 (82/50/53 9767)  Complications: No apparent anesthesia complications

## 2018-06-09 NOTE — Anesthesia Procedure Notes (Signed)
Procedure Name: MAC Date/Time: 06/09/2018 7:35 AM Performed by: Trinna Post., CRNA Pre-anesthesia Checklist: Patient identified, Suction available, Patient being monitored, Timeout performed and Emergency Drugs available Patient Re-evaluated:Patient Re-evaluated prior to induction Oxygen Delivery Method: Simple face mask Preoxygenation: Pre-oxygenation with 100% oxygen Induction Type: IV induction Placement Confirmation: positive ETCO2

## 2018-06-09 NOTE — Interval H&P Note (Signed)
History and Physical Interval Note:  06/09/2018 7:20 AM  Joseph Maryland Sr.  has presented today for surgery, with the diagnosis of steal syndrome  The various methods of treatment have been discussed with the patient and family. After consideration of risks, benefits and other options for treatment, the patient has consented to  Procedure(s): LIGATION ARTERIOVENOUS GORTEX GRAFT RIGHT ARM (Right) INSERTION OF DIALYSIS CATHETER RIGHT SIDE CHEST (Right) as a surgical intervention .  The patient's history has been reviewed, patient examined, no change in status, stable for surgery.  I have reviewed the patient's chart and labs.  Questions were answered to the patient's satisfaction.     Deitra Mayo

## 2018-06-09 NOTE — Progress Notes (Signed)
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 Initiated contact precautions per MD order.   Farley Ly RN

## 2018-06-09 NOTE — Progress Notes (Signed)
New Admission Note:   Arrival Method: stretcher from the PACU Mental Orientation: alert and oriented x4  Telemetry: box 09, NSR Assessment: Completed Skin: Intact IV: left FA NSL Pain: 0 Tubes: None Safety Measures: Safety Fall Prevention Plan has been discussed  Admission:  5 [place] Orientation: Patient has been orientated to the room, unit and staff.   Family: NA  Orders to be reviewed and implemented. Will continue to monitor the patient. Call light has been placed within reach and bed alarm has been activated.   Baldo Ash, RN

## 2018-06-09 NOTE — Op Note (Signed)
    NAME: Joseph GRITTON Sr.    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  Placement of right subclavian vein 19 cm tunneled dialysis catheter 2.  Ligation of right upper arm AV graft  SURGEON: Judeth Cornfield. Scot Dock, MD, FACS  ASSIST: None  ANESTHESIA: Local with sedation  EBL: Minimal  INDICATIONS:    COLLEN VINCENT Sr. is a 78 y.o. male who was seen in the office by the physician's assistant with a wound on the right middle finger.  The patient had had a previous right arm graft placed and he was set up for ligation of the graft and placement of a catheter.  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  Therefore I placed a right subclavian vein catheter. The right radial signal improved markedly after ligation of the graft.  He has a brisk radial signal with the Doppler but no ulnar or palmar arch signal.  TECHNIQUE:   The patient was taken to the operating room and sedated by anesthesia.  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  I looked at the IJ on the right with the SonoSite.  This was thrombosed.  This was confirmed with color-flow Doppler and with compression.  I therefore elected a right subclavian vein approach.  The neck and upper chest were prepped and draped in usual sterile fashion.  After the skin was anesthetized with 1% lidocaine, the right subclavian vein was cannulated and a guidewire introduced into the superior vena cava under fluoroscopic control.  The tract over the wire was dilated and the dilator and peel-away sheath were advanced over the wire and the dilator removed.  The 19 cm catheter was threaded over the wire through the peel-away sheath down into the distal superior vena cava.  The exit site for the  catheter was selected and the skin anesthetized between the 2 areas the catheter was brought through the tunnel cut to the appropriate length and the distal ports were attached.  Both ports withdrew easily with and flushed with heparinized saline and filled with concentrated heparin.  A sterile dressing was applied.  Attention was then turned to the right arm.  This was prepped and draped in usual sterile fashion.  After the skin was anesthetized a transverse incision was made over the right upper arm AV graft.  The graft was dissected free and ligated with two 2-0 silk ties.  At the completion there was no flow in the graft.  Hemostasis was obtained in this wound.  The wound was closed with a deep layer of 3-0 Vicryl and the skin closed with 4-0 Vicryl.  Dermabond was applied.  Patient tolerated the procedure well and was transferred to the recovery room in stable condition.  All needle and sponge counts were correct.  Deitra Mayo, MD, FACS [facility]  DATE OF DICTATION:   06/09/2018

## 2018-06-09 NOTE — Anesthesia Postprocedure Evaluation (Signed)
Anesthesia Post Note  Patient: Joseph CUPPLES Sr.  Procedure(s) Performed: LIGATION ARTERIOVENOUS GORTEX GRAFT RIGHT ARM (Right Arm Upper) INSERTION OF DIALYSIS CATHETER Right subclavian (Right Chest)     Patient location during evaluation: PACU Anesthesia Type: MAC Level of consciousness: awake and alert and oriented Pain management: pain level controlled Vital Signs Assessment: post-procedure vital signs reviewed and stable Respiratory status: spontaneous breathing, nonlabored ventilation, respiratory function stable and patient connected to nasal cannula oxygen Cardiovascular status: stable and blood pressure returned to baseline Postop Assessment: no apparent nausea or vomiting Anesthetic complications: no    Last Vitals:  Vitals:   06/09/18 0955 06/09/18 1025  BP: 98/64 97/66  Pulse: 78 77  Resp: 19 17  Temp: 36.7 C   SpO2: 100% 100%    Last Pain:  Vitals:   06/09/18 0955  TempSrc:   PainSc: 0-No pain                 Antoinne Spadaccini A.

## 2018-06-10 ENCOUNTER — Encounter (HOSPITAL_COMMUNITY): Payer: Self-pay | Admitting: Vascular Surgery

## 2018-06-10 DIAGNOSIS — D509 Iron deficiency anemia, unspecified: Secondary | ICD-10-CM | POA: Diagnosis not present

## 2018-06-10 DIAGNOSIS — Z992 Dependence on renal dialysis: Secondary | ICD-10-CM | POA: Diagnosis not present

## 2018-06-10 DIAGNOSIS — A419 Sepsis, unspecified organism: Secondary | ICD-10-CM | POA: Diagnosis not present

## 2018-06-10 DIAGNOSIS — N2581 Secondary hyperparathyroidism of renal origin: Secondary | ICD-10-CM | POA: Diagnosis not present

## 2018-06-10 DIAGNOSIS — T82898A Other specified complication of vascular prosthetic devices, implants and grafts, initial encounter: Secondary | ICD-10-CM | POA: Diagnosis not present

## 2018-06-10 DIAGNOSIS — N186 End stage renal disease: Secondary | ICD-10-CM | POA: Diagnosis not present

## 2018-06-10 NOTE — Discharge Summary (Signed)
Physician Discharge Summary   Patient ID: [name] [id number] 78 y.o. Mar 18, 1941  Admit date: 06/09/2018  Discharge date and time: 06/10/18  Admitting Physician: Angelia Mould, MD   Discharge Physician: same  Admission Diagnoses: steal syndrome  Discharge Diagnoses: same  Admission Condition: poor  Discharged Condition: fair  Indication for Admission: steal syndrome RUE with nonhealing wound right 2nd 3rd finger  Hospital Course: 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  He was brought in for ligation of right AV graft and placement of right subclavian TDC by Dr. Scot Dock on 06/09/2018.  He tolerated the procedure well and stayed for observation overnight.  POD #1 he now has a palpable right radial pulse.  Preop it was confirmed with his outpatient dialysis center that he could be fit onto the schedule for his normal dialysis day today.  He will also require follow-up with Dr. Sharol Given for likely surgical intervention of fingers of right hand.  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  For now he will dialyze via [facility] until right hand has been addressed.  He will be worked up for potential left thigh graft in the future however may need to be catheter dependent.  Discharge instructions were reviewed with the patient and he voices understanding.  He will be discharged this morning and will dialyze at his outpatient kidney center.  Consults: None  Treatments: surgery by Dr. Scot Dock 06/09/18: Ligation of R arm AVG and placement of R subclavian [facility]  Discharge Exam: Minimal pain at incision site Vitals:   06/09/18 2225 06/10/18 0354  BP: 140/76 137/87  Pulse: 78 85  Resp: 19 18  Temp: 98 F (36.7 C) 98.1 F (36.7 C)  SpO2: 99% 93%   Lungs:  Non labored Incisions:  R arm incision c/d/i; R subclavian TDC unremarkable Extremities:  Palpable  R radial pulse; no thrill or bruit R arm AVG; gangrene R 2,3 finger Neurologic: A&O   Disposition: Discharge disposition: 01-Home or Self Care      Patient Instructions:  Allergies as of 06/10/2018      Reactions   Aspirin Other (See Comments)   INTERNAL BLEEDING Causes internal bleeding  Other Reaction: history of ulcers      Medication List    TAKE these medications   atorvastatin 40 MG tablet Commonly known as:  LIPITOR Take 40 mg by mouth daily.   b complex-vitamin c-folic acid 0.8 MG Tabs tablet Take 0.8 mg by mouth See admin instructions. Takes on Tuesdays, Thursdays, Saturdays, and Sundays. Does not take on Mondays, Wednesdays, and Fridays due to Dialysis treatments.   cinacalcet 60 MG tablet Commonly known as:  SENSIPAR Take 60 mg by mouth every evening. With evening meal   HYDROcodone-acetaminophen 5-325 MG tablet Commonly known as:  NORCO/VICODIN Take 1 tablet by mouth every 4 (four) hours as needed for moderate pain.   ibuprofen 200 MG tablet Commonly known as:  ADVIL,MOTRIN Take 600 mg by mouth every 6 (six) hours as needed for mild pain.   nitroGLYCERIN 0.2 mg/hr patch Commonly known as:  NITRODUR - Dosed in mg/24 hr Place 1 patch (0.2 mg total) onto the skin daily. Apply 1 patch to dorsum of foot daily.   ondansetron 8 MG tablet Commonly known as:  ZOFRAN Take 1 tablet (8 mg total) by mouth every 8 (eight) hours as needed  for nausea or vomiting.   oxyCODONE-acetaminophen 5-325 MG tablet Commonly known as:  PERCOCET/ROXICET Take 1 tablet by mouth every 6 (six) hours as needed for severe pain.   pentoxifylline 400 MG CR tablet Commonly known as:  TRENTAL Take 1 tablet (400 mg total) by mouth 3 (three) times daily with meals.   POTASSIMIN PO Take 1 tablet by mouth daily.   sevelamer carbonate 800 MG tablet Commonly known as:  RENVELA Take 1,600-3,200 mg by mouth See admin instructions. Take 3200  mg by mouth 3 times daily with full meals and  take 1600 mg by mouth with snacks.   traMADol 50 MG tablet Commonly known as:  ULTRAM Take 50 mg by mouth 3 (three) times daily as needed for moderate pain.      Activity: activity as tolerated Diet: regular diet Wound Care: keep wound clean and dry  Signed: Dagoberto Ligas 06/10/2018 8:16 AM

## 2018-06-10 NOTE — Progress Notes (Signed)
Patient discharged to home. Patient AVS reviewed and signed. Patient capable re-verbalizing medications and follow-up appointments. IV removed. Patient belongings sent with patient. Patient educated to return to the ED in the event of SOB, chest pain or dizziness.   Ramelo Oetken B. RN 

## 2018-06-10 NOTE — Discharge Instructions (Signed)
° °  [facility] ° °Discharge Instructions ° °AV Fistula or Graft Surgery for Dialysis Access ° °Please refer to the following instructions for your post-procedure care. Your surgeon or physician assistant will discuss any changes with you. ° °Activity ° °You may drive the day following your surgery, if you are comfortable and no longer taking prescription pain medication. Resume full activity as the soreness in your incision resolves. ° °Bathing/Showering ° °You may shower after you go home. Keep your incision dry for 48 hours. Do not soak in a bathtub, hot tub, or swim until the incision heals completely. You may not shower if you have a hemodialysis catheter. ° °Incision Care ° °Clean your incision with mild soap and water after 48 hours. Pat the area dry with a clean towel. You do not need a bandage unless otherwise instructed. Do not apply any ointments or creams to your incision. You may have skin glue on your incision. Do not peel it off. It will come off on its own in about one week. Your arm may swell a bit after surgery. To reduce swelling use pillows to elevate your arm so it is above your heart. Your doctor will tell you if you need to lightly wrap your arm with an ACE bandage. ° °Diet ° °Resume your normal diet. There are not special food restrictions following this procedure. In order to heal from your surgery, it is CRITICAL to get adequate nutrition. Your body requires vitamins, minerals, and protein. Vegetables are the best source of vitamins and minerals. Vegetables also provide the perfect balance of protein. Processed food has little nutritional value, so try to avoid this. ° °Medications ° °Resume taking all of your medications. If your incision is causing pain, you may take over-the counter pain relievers such as acetaminophen (Tylenol). If you were prescribed a stronger pain medication, please be aware these medications can cause nausea and constipation. Prevent  nausea by taking the medication with a snack or meal. Avoid constipation by drinking plenty of fluids and eating foods with high amount of fiber, such as fruits, vegetables, and grains. Do not take Tylenol if you are taking prescription pain medications. ° ° ° ° °Follow up °Your surgeon may want to see you in the office following your access surgery. If so, this will be arranged at the time of your surgery. ° °Please call us immediately for any of the following conditions: ° °Increased pain, redness, drainage (pus) from your incision site °Fever of 101 degrees or higher °Severe or worsening pain at your incision site °Hand pain or numbness. ° °Reduce your risk of vascular disease: ° °Stop smoking. If you would like help, call [facility] at 1-800-QUIT-NOW (1-800-784-8669) or  at 336-586-4000 ° °Manage your cholesterol °Maintain a desired weight °Control your diabetes °Keep your blood pressure down ° °Dialysis ° °It will take several weeks to several months for your new dialysis access to be ready for use. Your surgeon will determine when it is OK to use it. Your nephrologist will continue to direct your dialysis. You can continue to use your Permcath until your new access is ready for use. ° °If you have any questions, please call the office at [phone]. ° °

## 2018-06-12 ENCOUNTER — Other Ambulatory Visit: Payer: Self-pay

## 2018-06-12 ENCOUNTER — Emergency Department (HOSPITAL_COMMUNITY): Payer: Medicare Other

## 2018-06-12 ENCOUNTER — Inpatient Hospital Stay (HOSPITAL_COMMUNITY)
Admission: EM | Admit: 2018-06-12 | Discharge: 2018-06-18 | DRG: 252 | Disposition: A | Discharge status: Skilled Nursing Facility | Payer: Medicare Other | Attending: Internal Medicine | Admitting: Internal Medicine

## 2018-06-12 ENCOUNTER — Encounter (HOSPITAL_COMMUNITY): Payer: Self-pay | Admitting: Emergency Medicine

## 2018-06-12 DIAGNOSIS — L03011 Cellulitis of right finger: Secondary | ICD-10-CM | POA: Diagnosis not present

## 2018-06-12 DIAGNOSIS — M2550 Pain in unspecified joint: Secondary | ICD-10-CM | POA: Diagnosis not present

## 2018-06-12 DIAGNOSIS — B9562 Methicillin resistant Staphylococcus aureus infection as the cause of diseases classified elsewhere: Secondary | ICD-10-CM | POA: Diagnosis present

## 2018-06-12 DIAGNOSIS — I350 Nonrheumatic aortic (valve) stenosis: Secondary | ICD-10-CM | POA: Diagnosis present

## 2018-06-12 DIAGNOSIS — M6281 Muscle weakness (generalized): Secondary | ICD-10-CM | POA: Diagnosis not present

## 2018-06-12 DIAGNOSIS — R2689 Other abnormalities of gait and mobility: Secondary | ICD-10-CM | POA: Diagnosis not present

## 2018-06-12 DIAGNOSIS — Y832 Surgical operation with anastomosis, bypass or graft as the cause of abnormal reaction of the patient, or of later complication, without mention of misadventure at the time of the procedure: Secondary | ICD-10-CM | POA: Diagnosis not present

## 2018-06-12 DIAGNOSIS — R41841 Cognitive communication deficit: Secondary | ICD-10-CM | POA: Diagnosis not present

## 2018-06-12 DIAGNOSIS — Z89421 Acquired absence of other right toe(s): Secondary | ICD-10-CM | POA: Diagnosis not present

## 2018-06-12 DIAGNOSIS — E1122 Type 2 diabetes mellitus with diabetic chronic kidney disease: Secondary | ICD-10-CM | POA: Diagnosis not present

## 2018-06-12 DIAGNOSIS — L02511 Cutaneous abscess of right hand: Secondary | ICD-10-CM | POA: Diagnosis not present

## 2018-06-12 DIAGNOSIS — M79641 Pain in right hand: Secondary | ICD-10-CM | POA: Diagnosis not present

## 2018-06-12 DIAGNOSIS — E43 Unspecified severe protein-calorie malnutrition: Secondary | ICD-10-CM

## 2018-06-12 DIAGNOSIS — Z8249 Family history of ischemic heart disease and other diseases of the circulatory system: Secondary | ICD-10-CM | POA: Diagnosis not present

## 2018-06-12 DIAGNOSIS — M8628 Subacute osteomyelitis, other site: Secondary | ICD-10-CM | POA: Diagnosis not present

## 2018-06-12 DIAGNOSIS — Z89431 Acquired absence of right foot: Secondary | ICD-10-CM | POA: Diagnosis not present

## 2018-06-12 DIAGNOSIS — I739 Peripheral vascular disease, unspecified: Secondary | ICD-10-CM | POA: Diagnosis not present

## 2018-06-12 DIAGNOSIS — Z22322 Carrier or suspected carrier of Methicillin resistant Staphylococcus aureus: Secondary | ICD-10-CM

## 2018-06-12 DIAGNOSIS — Z79899 Other long term (current) drug therapy: Secondary | ICD-10-CM | POA: Diagnosis not present

## 2018-06-12 DIAGNOSIS — E440 Moderate protein-calorie malnutrition: Secondary | ICD-10-CM

## 2018-06-12 DIAGNOSIS — I120 Hypertensive chronic kidney disease with stage 5 chronic kidney disease or end stage renal disease: Secondary | ICD-10-CM | POA: Diagnosis not present

## 2018-06-12 DIAGNOSIS — T82898A Other specified complication of vascular prosthetic devices, implants and grafts, initial encounter: Secondary | ICD-10-CM | POA: Diagnosis not present

## 2018-06-12 DIAGNOSIS — R52 Pain, unspecified: Secondary | ICD-10-CM

## 2018-06-12 DIAGNOSIS — Z4781 Encounter for orthopedic aftercare following surgical amputation: Secondary | ICD-10-CM | POA: Diagnosis not present

## 2018-06-12 DIAGNOSIS — I10 Essential (primary) hypertension: Secondary | ICD-10-CM | POA: Diagnosis present

## 2018-06-12 DIAGNOSIS — L0390 Cellulitis, unspecified: Secondary | ICD-10-CM | POA: Diagnosis not present

## 2018-06-12 DIAGNOSIS — I2510 Atherosclerotic heart disease of native coronary artery without angina pectoris: Secondary | ICD-10-CM | POA: Diagnosis present

## 2018-06-12 DIAGNOSIS — Z992 Dependence on renal dialysis: Secondary | ICD-10-CM

## 2018-06-12 DIAGNOSIS — I96 Gangrene, not elsewhere classified: Secondary | ICD-10-CM | POA: Diagnosis not present

## 2018-06-12 DIAGNOSIS — D631 Anemia in chronic kidney disease: Secondary | ICD-10-CM | POA: Diagnosis not present

## 2018-06-12 DIAGNOSIS — I428 Other cardiomyopathies: Secondary | ICD-10-CM

## 2018-06-12 DIAGNOSIS — Z7401 Bed confinement status: Secondary | ICD-10-CM | POA: Diagnosis not present

## 2018-06-12 DIAGNOSIS — D72829 Elevated white blood cell count, unspecified: Secondary | ICD-10-CM | POA: Diagnosis present

## 2018-06-12 DIAGNOSIS — A419 Sepsis, unspecified organism: Secondary | ICD-10-CM | POA: Diagnosis not present

## 2018-06-12 DIAGNOSIS — S91301A Unspecified open wound, right foot, initial encounter: Secondary | ICD-10-CM | POA: Diagnosis not present

## 2018-06-12 DIAGNOSIS — Z79891 Long term (current) use of opiate analgesic: Secondary | ICD-10-CM | POA: Diagnosis not present

## 2018-06-12 DIAGNOSIS — N186 End stage renal disease: Secondary | ICD-10-CM | POA: Diagnosis not present

## 2018-06-12 DIAGNOSIS — D509 Iron deficiency anemia, unspecified: Secondary | ICD-10-CM | POA: Diagnosis not present

## 2018-06-12 DIAGNOSIS — Z789 Other specified health status: Secondary | ICD-10-CM | POA: Diagnosis present

## 2018-06-12 DIAGNOSIS — Z89021 Acquired absence of right finger(s): Secondary | ICD-10-CM | POA: Diagnosis not present

## 2018-06-12 DIAGNOSIS — Z9049 Acquired absence of other specified parts of digestive tract: Secondary | ICD-10-CM | POA: Diagnosis not present

## 2018-06-12 DIAGNOSIS — M7989 Other specified soft tissue disorders: Secondary | ICD-10-CM | POA: Diagnosis not present

## 2018-06-12 DIAGNOSIS — R6520 Severe sepsis without septic shock: Secondary | ICD-10-CM | POA: Diagnosis not present

## 2018-06-12 DIAGNOSIS — A4102 Sepsis due to Methicillin resistant Staphylococcus aureus: Secondary | ICD-10-CM | POA: Diagnosis not present

## 2018-06-12 DIAGNOSIS — R42 Dizziness and giddiness: Secondary | ICD-10-CM | POA: Diagnosis not present

## 2018-06-12 DIAGNOSIS — Z886 Allergy status to analgesic agent status: Secondary | ICD-10-CM | POA: Diagnosis not present

## 2018-06-12 DIAGNOSIS — Z87891 Personal history of nicotine dependence: Secondary | ICD-10-CM | POA: Diagnosis not present

## 2018-06-12 DIAGNOSIS — N2581 Secondary hyperparathyroidism of renal origin: Secondary | ICD-10-CM | POA: Diagnosis not present

## 2018-06-12 DIAGNOSIS — Z4801 Encounter for change or removal of surgical wound dressing: Secondary | ICD-10-CM | POA: Diagnosis not present

## 2018-06-12 DIAGNOSIS — F419 Anxiety disorder, unspecified: Secondary | ICD-10-CM | POA: Diagnosis present

## 2018-06-12 DIAGNOSIS — I69354 Hemiplegia and hemiparesis following cerebral infarction affecting left non-dominant side: Secondary | ICD-10-CM | POA: Diagnosis not present

## 2018-06-12 DIAGNOSIS — E1152 Type 2 diabetes mellitus with diabetic peripheral angiopathy with gangrene: Secondary | ICD-10-CM | POA: Diagnosis not present

## 2018-06-12 LAB — CBC WITH DIFFERENTIAL/PLATELET
Abs Immature Granulocytes: 0.16 10*3/uL — ABNORMAL HIGH (ref 0.00–0.07)
Basophils Absolute: 0.1 10*3/uL (ref 0.0–0.1)
Basophils Relative: 0 %
Eosinophils Absolute: 0.1 10*3/uL (ref 0.0–0.5)
Eosinophils Relative: 0 %
HCT: 42.1 % (ref 39.0–52.0)
Hemoglobin: 13.2 g/dL (ref 13.0–17.0)
Immature Granulocytes: 1 %
LYMPHS PCT: 5 %
Lymphs Abs: 1.3 10*3/uL (ref 0.7–4.0)
MCH: 20.1 pg — ABNORMAL LOW (ref 26.0–34.0)
MCHC: 31.4 g/dL (ref 30.0–36.0)
MCV: 64.1 fL — ABNORMAL LOW (ref 80.0–100.0)
Monocytes Absolute: 1.7 10*3/uL — ABNORMAL HIGH (ref 0.1–1.0)
Monocytes Relative: 7 %
Neutro Abs: 20.5 10*3/uL — ABNORMAL HIGH (ref 1.7–7.7)
Neutrophils Relative %: 87 %
PLATELETS: 382 10*3/uL (ref 150–400)
RBC: 6.57 MIL/uL — ABNORMAL HIGH (ref 4.22–5.81)
RDW: 19.1 % — ABNORMAL HIGH (ref 11.5–15.5)
WBC: 23.8 10*3/uL — ABNORMAL HIGH (ref 4.0–10.5)
nRBC: 0 % (ref 0.0–0.2)

## 2018-06-12 LAB — CBC
HEMATOCRIT: 42.2 % (ref 39.0–52.0)
Hemoglobin: 13 g/dL (ref 13.0–17.0)
MCH: 19.5 pg — ABNORMAL LOW (ref 26.0–34.0)
MCHC: 30.8 g/dL (ref 30.0–36.0)
MCV: 63.3 fL — ABNORMAL LOW (ref 80.0–100.0)
Platelets: 366 10*3/uL (ref 150–400)
RBC: 6.67 MIL/uL — ABNORMAL HIGH (ref 4.22–5.81)
RDW: 18.5 % — AB (ref 11.5–15.5)
WBC: 22.8 10*3/uL — AB (ref 4.0–10.5)
nRBC: 0 % (ref 0.0–0.2)

## 2018-06-12 LAB — COMPREHENSIVE METABOLIC PANEL
ALT: <5 U/L (ref 0–44)
ANION GAP: 12 (ref 5–15)
AST: 20 U/L (ref 15–41)
Albumin: 2.8 g/dL — ABNORMAL LOW (ref 3.5–5.0)
Alkaline Phosphatase: 79 U/L (ref 38–126)
BUN: 19 mg/dL (ref 8–23)
CHLORIDE: 96 mmol/L — AB (ref 98–111)
CO2: 28 mmol/L (ref 22–32)
Calcium: 9 mg/dL (ref 8.9–10.3)
Creatinine, Ser: 5.07 mg/dL — ABNORMAL HIGH (ref 0.61–1.24)
GFR calc Af Amer: 12 mL/min — ABNORMAL LOW (ref >60)
GFR calc non Af Amer: 10 mL/min — ABNORMAL LOW (ref >60)
Glucose, Bld: 84 mg/dL (ref 70–99)
Potassium: 3.3 mmol/L — ABNORMAL LOW (ref 3.5–5.1)
Sodium: 136 mmol/L (ref 135–145)
Total Bilirubin: 0.6 mg/dL (ref 0.3–1.2)
Total Protein: 8.1 g/dL (ref 6.5–8.1)

## 2018-06-12 LAB — CREATININE, SERUM
Creatinine, Ser: 5.7 mg/dL — ABNORMAL HIGH (ref 0.61–1.24)
GFR calc Af Amer: 10 mL/min — ABNORMAL LOW (ref >60)
GFR, EST NON AFRICAN AMERICAN: 9 mL/min — AB (ref >60)

## 2018-06-12 LAB — LACTIC ACID, PLASMA
LACTIC ACID, VENOUS: 2.7 mmol/L — AB (ref 0.5–1.9)
Lactic Acid, Venous: 3.3 mmol/L (ref 0.5–1.9)

## 2018-06-12 MED ORDER — ONDANSETRON HCL 4 MG/2ML IJ SOLN
4.0000 mg | Freq: Four times a day (QID) | INTRAMUSCULAR | Status: DC | PRN
  Administered 2018-06-14: 4 mg via INTRAVENOUS
  Filled 2018-06-12: qty 2

## 2018-06-12 MED ORDER — SENNOSIDES-DOCUSATE SODIUM 8.6-50 MG PO TABS: 1.0000 | ORAL_TABLET | Freq: Two times a day (BID) | ORAL | Status: DC | PRN

## 2018-06-12 MED ORDER — SODIUM CHLORIDE 0.9 % IV BOLUS (SEPSIS)
1000.0000 mL | Freq: Once | INTRAVENOUS | Status: AC
  Administered 2018-06-12: 1000 mL via INTRAVENOUS

## 2018-06-12 MED ORDER — VANCOMYCIN HCL IN DEXTROSE 1-5 GM/200ML-% IV SOLN
1000.0000 mg | Freq: Once | INTRAVENOUS | Status: AC
  Administered 2018-06-12: 1000 mg via INTRAVENOUS
  Filled 2018-06-12: qty 200

## 2018-06-12 MED ORDER — HEPARIN SODIUM (PORCINE) 5000 UNIT/ML IJ SOLN
5000.0000 [IU] | Freq: Three times a day (TID) | INTRAMUSCULAR | Status: DC
  Administered 2018-06-12 – 2018-06-18 (×16): 5000 [IU] via SUBCUTANEOUS
  Filled 2018-06-12 (×18): qty 1

## 2018-06-12 MED ORDER — SODIUM CHLORIDE 0.9 % IV SOLN
INTRAVENOUS | Status: AC
  Administered 2018-06-12 – 2018-06-13 (×3): via INTRAVENOUS

## 2018-06-12 MED ORDER — ACETAMINOPHEN 650 MG RE SUPP: 650.0000 mg | Freq: Four times a day (QID) | RECTAL | Status: DC | PRN

## 2018-06-12 MED ORDER — VANCOMYCIN HCL IN DEXTROSE 750-5 MG/150ML-% IV SOLN: 750.0000 mg | INTRAVENOUS | Status: DC

## 2018-06-12 MED ORDER — SODIUM CHLORIDE 0.9 % IV BOLUS (SEPSIS)
500.0000 mL | Freq: Once | INTRAVENOUS | Status: AC
  Administered 2018-06-12: 500 mL via INTRAVENOUS

## 2018-06-12 MED ORDER — PIPERACILLIN-TAZOBACTAM 3.375 G IVPB
3.3750 g | Freq: Two times a day (BID) | INTRAVENOUS | Status: DC
  Administered 2018-06-12 – 2018-06-17 (×11): 3.375 g via INTRAVENOUS
  Filled 2018-06-12 (×11): qty 50

## 2018-06-12 MED ORDER — HYDROCODONE-ACETAMINOPHEN 5-325 MG PO TABS
1.0000 | ORAL_TABLET | Freq: Four times a day (QID) | ORAL | Status: DC | PRN
  Administered 2018-06-14: 2 via ORAL
  Administered 2018-06-14: 1 via ORAL
  Administered 2018-06-15: 2 via ORAL
  Administered 2018-06-15: 1 via ORAL
  Administered 2018-06-16 – 2018-06-18 (×5): 2 via ORAL
  Filled 2018-06-12: qty 1
  Filled 2018-06-12 (×2): qty 2
  Filled 2018-06-12: qty 1
  Filled 2018-06-12 (×5): qty 2

## 2018-06-12 MED ORDER — ONDANSETRON HCL 4 MG PO TABS: 4.0000 mg | ORAL_TABLET | Freq: Four times a day (QID) | ORAL | Status: DC | PRN

## 2018-06-12 MED ORDER — ACETAMINOPHEN 325 MG PO TABS
650.0000 mg | ORAL_TABLET | Freq: Four times a day (QID) | ORAL | Status: DC | PRN
  Administered 2018-06-14: 650 mg via ORAL
  Filled 2018-06-12: qty 2

## 2018-06-12 MED ORDER — HYDROMORPHONE HCL 1 MG/ML IJ SOLN
0.5000 mg | INTRAMUSCULAR | Status: DC | PRN
  Administered 2018-06-12 – 2018-06-15 (×5): 0.5 mg via INTRAVENOUS
  Filled 2018-06-12 (×3): qty 0.5
  Filled 2018-06-12: qty 1
  Filled 2018-06-12: qty 0.5

## 2018-06-12 NOTE — H&P (Signed)
History and Physical  [name] HYW:[id number] DOB: 1940/12/02 DOA: 06/12/2018  Referring physician: Reather Converse MD  PCP: [name], MD   Chief Complaint: right hand pain  HPI: Joseph BONNETTE Sr. is a 78 y.o. male with end-stage renal disease on hemodialysis last dialyzed this morning, recent steal syndrome and recently discharged from [facility] on 06/10/2018 after having a ligation of the right AV graft and placement of right subclavian TDC by Dr. Doren Custard on 06/09/2018.  He had been followed and observed for gangrene of his right second and third finger.  He was discharged on a course of oral antibiotics.  Patient says that he has had increasing pain and swelling in the right middle finger.  This has progressed over the last 2 weeks.  Swelling and drainage has profoundly worsened over the past 2 days.  The patient denies having fever chills nausea vomiting.  He does feel weak and has malaise.  He reports a foul smell from drainage coming from the finger.  He denies having chest pain or shortness of breath.  ED course: The patient was hypotensive on arrival with a blood pressure of 81/36.  His pulse was 110.  Temperature 99.6.  His oxygenation was 96% on room air.  The patient had a lactic acid of 3.3.  BC was 23.8.  Hemoglobin 13.2.  Platelet count 382.  Potassium was 3.3.  Creatinine was 5.07.  The patient on the sepsis bundle and given boluses of IV fluid.  He was started on broad-spectrum antibiotics vancomycin and Zosyn.  Hand surgery was consulted and medicine was consulted to admit the patient to [facility] so he can be evaluated by the surgical teams.  Review of Systems:  Constitutional: Positive for chills. Negative for fever.  HENT: Negative for congestion.   Eyes: Negative for visual disturbance.  Respiratory: Negative for shortness of breath.   Cardiovascular: Negative for chest pain.  Gastrointestinal: Negative for abdominal pain and vomiting.  Genitourinary: Negative for dysuria and  flank pain.  Musculoskeletal: Negative for back pain, neck pain and neck stiffness.  Skin: Positive for rash and wound.  Neurological: Positive for light-headedness. Negative for headaches.  All systems reviewed and apart from history of presenting illness, are negative.  Past Medical History:  Diagnosis Date  . Anxiety   . Aortic stenosis   . Arthritis   . CVA (cerebral infarction)   . ESRD (end stage renal disease) on dialysis [facility])    M/W/F at [facility] in [place]  . Essential hypertension    resolved with HD  . Gangrene of right foot ([place])   . Gastric ulcer 2004  . History of cardiomyopathy    LVEF normal as of February 2017  . History of stroke    Left side weakness  . Iron deficiency anemia    Past Surgical History:  Procedure Laterality Date  . ABDOMINAL AORTAGRAM N/A 01/24/2012   Procedure: ABDOMINAL Maxcine Ham;  Surgeon: Elam Dutch, MD;  Location: [facility] CATH LAB;  Service: Cardiovascular;  Laterality: N/A;  . ABDOMINAL AORTOGRAM W/LOWER EXTREMITY N/A 04/15/2017   Procedure: ABDOMINAL AORTOGRAM W/LOWER EXTREMITY;  Surgeon: Angelia Mould, MD;  Location: [place] CV LAB;  Service: Cardiovascular;  Laterality: N/A;  . AMPUTATION Right 05/09/2017   Procedure: RIGHT TRANSMETATARSAL AMPUTATION;  Surgeon: [name], MD;  Location: [place];  Service: Orthopedics;  Laterality: Right;  . ARTERIOVENOUS GRAFT PLACEMENT Right right arm  . AV FISTULA PLACEMENT Left 08/31/2015   Procedure: ARTERIOVENOUS (AV)  FISTULA CREATION- LEFT RADIOCEPHALIC;  Surgeon: Mal Misty, MD;  Location: [place];  Service: Vascular;  Laterality: Left;  . AV FISTULA PLACEMENT Right 02/18/2017   Procedure: INSERTION OF ARTERIOVENOUS (AV) GORE-TEX GRAFT  RIGHT UPPER ARM;  Surgeon: Conrad El Dara, MD;  Location: [place];  Service: Vascular;  Laterality: Right;  . BASCILIC VEIN TRANSPOSITION Left 12/19/2015   Procedure: FIRST STAGE BRACHIAL VEIN TRANSPOSITION;  Surgeon: Conrad [place], MD;  Location: [place];  Service: Vascular;  Laterality: Left;  . BASCILIC VEIN TRANSPOSITION Left 02/08/2016   Procedure: SECOND STAGE BRACHIAL VEIN TRANSPOSITION;  Surgeon: Conrad Wedgefield, MD;  Location: [place];  Service: Vascular;  Laterality: Left;  . CARDIAC CATHETERIZATION N/A 07/11/2015   Procedure: Left Heart Cath and Coronary Angiography;  Surgeon: Troy Sine, MD;  Location: [place] CV LAB;  Service: Cardiovascular;  Laterality: N/A;  . Carpel Tunnel Left Dec. 22, 2016  . CHOLECYSTECTOMY    . COLONOSCOPY  2004   Dr. Irving Shows, left sided diverticula and cecal polyp, path unknown  . COLONOSCOPY  10/29/2011   Procedure: COLONOSCOPY;  Surgeon: Daneil Dolin, MD;  [facility];  Service: Endoscopy;  Laterality: N/A;  10:15  . ESOPHAGOGASTRODUODENOSCOPY  11/2002   Dr. Gala Romney, erosive reflux esophagitis, multiple gastric ulcer and antral/bulbar erosions. Serologies positive for H.Pylori and was treated  . ESOPHAGOGASTRODUODENOSCOPY  11/20014   Dr. Gala Romney, small hh only, ulcers healed  . ESOPHAGOGASTRODUODENOSCOPY  09/21/2011   Dr Trevor Iha [facility], antral erosions, ?early GAVE  . FISTULOGRAM Left 12/10/2016   Procedure: THROMBECTOMY OF LEFT ARM ARTERIOVENOUS FISTULA;  Surgeon: Waynetta Sandy, MD;  Location: [place];  Service: Vascular;  Laterality: Left;  . INSERTION OF DIALYSIS CATHETER Left 12/10/2016   Procedure: INSERTION OF TUNNELED DIALYSIS CATHETER;  Surgeon: Waynetta Sandy, MD;  Location: [place];  Service: Vascular;  Laterality: Left;  . INSERTION OF DIALYSIS CATHETER Right 06/09/2018   Procedure: INSERTION OF DIALYSIS CATHETER Right subclavian;  Surgeon: Angelia Mould, MD;  Location: [place];  Service: Vascular;  Laterality: Right;  . IR DIALY SHUNT INTRO [place] W/IMG RIGHT Right 01/01/2018  . IR GENERIC HISTORICAL  07/16/2016   IR REMOVAL TUN CV CATH W/O FL 07/16/2016 Saverio Danker, PA-C [facility]-INTERV RAD  . IR PTA ADDL CENTRAL DIALYSIS SEG THRU DIALY  CIRCUIT RIGHT Right 10/21/2017  . IR REMOVAL TUN CV CATH W/O FL  05/12/2017  . IR THROMBECTOMY AV FISTULA W/THROMBOLYSIS/PTA INC/SHUNT/IMG RIGHT Right 10/21/2017  . IR THROMBECTOMY AV FISTULA W/THROMBOLYSIS/PTA INC/SHUNT/IMG RIGHT Right 11/27/2017  . IR US GUIDE VASC ACCESS RIGHT  10/21/2017  . IR US GUIDE VASC ACCESS RIGHT  11/27/2017  . IR US GUIDE VASC ACCESS RIGHT  01/01/2018  . LIGATION ARTERIOVENOUS GORTEX GRAFT Right 06/09/2018  . LIGATION ARTERIOVENOUS GORTEX GRAFT Right 06/09/2018   Procedure: LIGATION ARTERIOVENOUS GORTEX GRAFT RIGHT ARM;  Surgeon: Angelia Mould, MD;  Location: [place];  Service: Vascular;  Laterality: Right;  . LIGATION OF ARTERIOVENOUS  FISTULA Left 12/19/2015   Procedure: LIGATION OF RADIOCEPHALIC ARTERIOVENOUS  FISTULA;  Surgeon: Conrad Zebulon, MD;  Location: [place];  Service: Vascular;  Laterality: Left;  Marland Kitchen MASS EXCISION Right 02/18/2017   Procedure: EXCISION OF RIGHT AXILLARY EPIDERMAL INCLUSION CYST;  Surgeon: Conrad Waterford, MD;  Location: [place];  Service: Vascular;  Laterality: Right;  . PERIPHERAL VASCULAR CATHETERIZATION N/A 12/14/2015   Procedure: Fistulagram;  Surgeon: Conrad Gaston, MD;  Location: [place] CV  LAB;  Service: Cardiovascular;  Laterality: N/A;  . SHOULDER SURGERY    . ULTRASOUND GUIDANCE FOR VASCULAR ACCESS  04/15/2017   Procedure: Ultrasound Guidance For Vascular Access;  Surgeon: Angelia Mould, MD;  Location: [place] CV LAB;  Service: Cardiovascular;;  . UPPER EXTREMITY VENOGRAPHY Bilateral 12/17/2016   Procedure: Bilateral Upper Extremity Venography;  Surgeon: Serafina Mitchell, MD;  Location: [place] CV LAB;  Service: Cardiovascular;  Laterality: Bilateral;   Social History:  reports that he quit smoking about 14 years ago. He quit smokeless tobacco use about 31 years ago.  His smokeless tobacco use included chew. He reports that he does not drink alcohol or use drugs.  Allergies  Allergen Reactions  . Aspirin Other (See  Comments)    INTERNAL BLEEDING Causes internal bleeding  Other Reaction: history of ulcers    Family History  Problem Relation Age of Onset  . Hypertension Mother   . Colon cancer Neg Hx   . Liver disease Neg Hx     Prior to Admission medications   Medication Sig Start Date End Date Taking? Authorizing Provider  atorvastatin (LIPITOR) 40 MG tablet Take 40 mg by mouth daily. 11/03/15   [provider]  b complex-vitamin c-folic acid (NEPHRO-VITE) 0.8 MG TABS Take 0.8 mg by mouth See admin instructions. Takes on Tuesdays, Thursdays, Saturdays, and Sundays. Does not take on Mondays, Wednesdays, and Fridays due to Dialysis treatments.    [provider]  cinacalcet (SENSIPAR) 60 MG tablet Take 60 mg by mouth every evening. With evening meal    [provider]  HYDROcodone-acetaminophen (NORCO/VICODIN) 5-325 MG tablet Take 1 tablet by mouth every 4 (four) hours as needed for moderate pain. Patient not taking: Reported on 06/09/2018 05/09/17   [name], MD  ibuprofen (ADVIL,MOTRIN) 200 MG tablet Take 600 mg by mouth every 6 (six) hours as needed for mild pain.    [provider]  nitroGLYCERIN (NITRODUR - DOSED IN MG/24 HR) 0.2 mg/hr patch Place 1 patch (0.2 mg total) onto the skin daily. Apply 1 patch to dorsum of foot daily. Patient not taking: Reported on 06/09/2018 05/05/18   Rayburn, Neta Mends, PA-C  ondansetron (ZOFRAN) 8 MG tablet Take 1 tablet (8 mg total) by mouth every 8 (eight) hours as needed for nausea or vomiting. Patient not taking: Reported on 06/09/2018 03/24/17   Daleen Bo, MD  oxyCODONE-acetaminophen (PERCOCET/ROXICET) 5-325 MG tablet Take 1 tablet by mouth every 6 (six) hours as needed for severe pain. Patient not taking: Reported on 06/09/2018 05/13/17   [name], MD  pentoxifylline (TRENTAL) 400 MG CR tablet Take 1 tablet (400 mg total) by mouth 3 (three) times daily with meals. Patient not taking: Reported on 06/09/2018  05/05/18   Rayburn, Neta Mends, PA-C  Potassium (POTASSIMIN PO) Take 1 tablet by mouth daily.    [provider]  sevelamer (RENVELA) 800 MG tablet Take 1,600-3,200 mg by mouth See admin instructions. Take 3200  mg by mouth 3 times daily with full meals and take 1600 mg by mouth with snacks.    [provider]  traMADol (ULTRAM) 50 MG tablet Take 50 mg by mouth 3 (three) times daily as needed for moderate pain.    [provider]   Physical Exam: Vitals:   06/12/18 1236 06/12/18 1245 06/12/18 1300 06/12/18 1330  BP:   (!) 132/48 (!) 143/48  Pulse: (!) 53 (!) 52  (!) 50  Resp: 20 (!) 26 19 (!)  21  Temp:      TempSrc:      SpO2: 98% 99%  100%  Weight:      Height:        General exam: Chronically ill-appearing male appears uncomfortable and in pain, supine on the gurney in no obvious distress.  Putrid foul smell in the air.  Multiple flies circulating about his right hand.    Head, eyes and ENT: Nontraumatic and normocephalic. Pupils equally reacting to light and accommodation. Oral mucosa dry.  Neck: Supple.  Right IJ TDC in place.  Lymphatics: No lymphadenopathy.  Respiratory system: Bilateral breath sounds.  Clear to auscultation. No increased work of breathing.  Cardiovascular system: S1 and S2 heard.   Gastrointestinal system: Abdomen is nondistended, soft and nontender. Normal bowel sounds heard. No organomegaly or masses appreciated.  Central nervous system: Alert and oriented. No focal neurological deficits.  Extremities: Gangrene right middle finger with foul-smelling extensive edema and purulent drainage at the base of the MCP of the middle finger.  Necrotic black right middle finger with foul smell appears dead and flies trying to lay eggs on right hand.  Cellulitis and warmth from the wrist proximally.  Poor pulses in right hand.  Warm hand.  Skin: No rashes or acute findings.  Musculoskeletal system: Negative exam.  Psychiatry:  Pleasant and cooperative.  Labs on Admission:  Basic Metabolic Panel: Recent Labs  Lab 06/09/18 0650 06/09/18 1232 06/12/18 1141  NA 136  --  136  K 3.6  --  3.3*  CL  --   --  96*  CO2  --   --  28  GLUCOSE 90  --  84  BUN  --   --  19  CREATININE  --  8.08* 5.07*  CALCIUM  --   --  9.0   Liver Function Tests: Recent Labs  Lab 06/12/18 1141  AST 20  ALT <5  ALKPHOS 79  BILITOT 0.6  PROT 8.1  ALBUMIN 2.8*   No results for input(s): LIPASE, AMYLASE in the last 168 hours. No results for input(s): AMMONIA in the last 168 hours. CBC: Recent Labs  Lab 06/09/18 0650 06/09/18 1232 06/12/18 1141  WBC  --  26.4* 23.8*  NEUTROABS  --   --  20.5*  HGB 15.3 14.3 13.2  HCT 45.0 44.3 42.1  MCV  --  63.6* 64.1*  PLT  --  PLATELET CLUMPS NOTED ON SMEAR, COUNT APPEARS ADEQUATE 382   Cardiac Enzymes: No results for input(s): CKTOTAL, CKMB, CKMBINDEX, TROPONINI in the last 168 hours.  BNP (last 3 results) No results for input(s): PROBNP in the last 8760 hours. CBG: No results for input(s): GLUCAP in the last 168 hours.  Radiological Exams on Admission: Dg Hand 2 View Right  Result Date: 06/12/2018 CLINICAL DATA:  Acute right hand pain and swelling. EXAM: RIGHT HAND - 2 VIEW COMPARISON:  None. FINDINGS: There is no evidence of fracture or dislocation. There is no evidence of arthropathy or other focal bone abnormality. Vascular calcifications are noted. Dorsal soft tissue swelling is noted which contains gas consistent with cellulitis. Severe enlargement of the third and fourth fingers is noted consistent with infection. Gas is present and gangrene can not be excluded. IMPRESSION: Dorsal soft tissue swelling is noted which contains gas consistent with cellulitis. Severe soft tissue enlargement of the third and fourth fingers is noted as well which contains gas concerning for cellulitis or possibly gangrene. No definite lytic destruction is seen to suggest osteomyelitis.  Electronically Signed   By: Marijo Conception, M.D.   On: 06/12/2018 13:56   EKG: Independently reviewed.  Sinus tachycardia with PVCs.  Assessment/Plan Principal Problem:   Severe sepsis (HCC) Active Problems:   ESRD on dialysis ([place])   Non-ischemic cardiomyopathy- EF 35- 45%   CAD- 40-50% LAD at cath 07/11/15   Aspirin intolerance   Moderate aortic stenosis   PVD (peripheral vascular disease) (HCC)   S/P transmetatarsal amputation of foot, right (HCC)   Gangrene of finger (HCC)   Leukocytosis   MRSA (methicillin resistant staph aureus) culture positive   Essential hypertension  1. Cellulitis and gangrene of the right hand involving the right middle finger- the patient is severely septic and that is being addressed with IV fluids, IV antibiotics and supportive therapy.  Dr. Reather Converse consulted to hand surgery Dr. Lenon Curt who will see the patient when he arrives at [facility].  Dr. Oneida Alar is on-call for vascular surgery today and I have called and asked for consultation as they have been following patient and discharged him 2 days ago.  Patient is going to require operative management for this problem. 2. End-stage renal disease on hemodialysis-patient has received a full hemodialysis treatment earlier today.  Consult the nephrology service to resume hemodialysis treatments on Monday. 3. Leukocytosis-secondary to severe sepsis and gangrene.  Follow WBC. 4. Severe sepsis-lactic acid is 3.3 on admission, the patient has been placed on the sepsis bundle and IV fluids have been bolused.  Following lactic acid. 5. Essential hypertension- follow blood pressures closely as he was hypotensive on arrival.  At this time, his BP has improved.   I asked the pharmacy to please reconcile his home medications.  6. Hypotension-resolved after fluid boluses given. 7. Nonischemic cardiomyopathy- monitor fluid status as he is being bolused via the sepsis bundle. 8. Peripheral vascular disease-severe and I have asked  the vascular surgery service to consult.  He was just discharged by their service 2 days ago. 9. MRSA positive-he has been started on vancomycin and Zosyn.  DVT Prophylaxis: Heparin Code Status: Full Family Communication: Bedside Disposition Plan: Admitting to [facility] progressive care unit  Critical Care Time spent: 39 minutes   Joseph Emery, MD [facility] How to contact the [facility] Attending or Consulting provider [place] or covering provider during after hours [place], for this patient?  1. Check the care team in [facility] and look for a) attending/consulting [facility] provider listed and b) the [facility] team listed 2. Log into [email or web address] and use [place]'s universal password to access. If you do not have the password, please contact the hospital operator. 3. Locate the [facility] provider you are looking for under [facility] and page to a number that you can be directly reached. 4. If you still have difficulty reaching the provider, please page the [facility] (Director on Call) for the Hospitalists listed on amion for assistance.

## 2018-06-12 NOTE — ED Notes (Signed)
[facility] called will send truck when available.Marland Kitchen

## 2018-06-12 NOTE — Progress Notes (Signed)
Joseph TREBILCOCK Sr. is a 78 y.o. male patient admitted from ED awake, alert - oriented  X 4 - no acute distress noted.  VSS - Blood pressure (!) 129/98, pulse (!) 40, temperature 98 F (36.7 C), temperature source Oral, resp. rate 20, height 5\' 5"  (1.651 m), weight 77.1 kg, SpO2 96 %.    IV in place, occlusive dsg intact without redness.  Orientation to room, and floor completed with information packet given to patient/family.  Patient declined safety video at this time.  Admission INP armband ID verified with patient/family, and in place.   SR up x 2, fall assessment complete, with patient and family able to verbalize understanding of risk associated with falls, and verbalized understanding to call nsg before up out of bed.  Call light within reach, patient able to voice, and demonstrate understanding.  Skin, clean-dry- intact without evidence of bruising, or skin tears.   No evidence of skin break down noted on exam.     Will cont to eval and treat per MD orders.  Luci Bank, RN 06/12/2018 6:12 PM

## 2018-06-12 NOTE — ED Notes (Signed)
Pt is HD pt and does not void.  Dr Reather Converse informed and plan is to d/c urine and urine culture because he has obvious source.

## 2018-06-12 NOTE — ED Notes (Signed)
[facility] en route.  °

## 2018-06-12 NOTE — ED Notes (Signed)
Cleaned hand

## 2018-06-12 NOTE — ED Notes (Signed)
[facility] into transport 

## 2018-06-12 NOTE — ED Provider Notes (Signed)
[facility] EMERGENCY DEPARTMENT Provider Note   CSN: [id number] Arrival date & time: 06/12/18  1112     History   Chief Complaint Chief Complaint  Patient presents with  . Hand Pain    HPI Joseph LITTON Sr. is a 78 y.o. male.  Patient with significant medical history including end-stage renal disease on dialysis last dialyzed this morning, recent steal syndrome and vascular procedure, gangrene of the right foot, cardiomyopathy nonischemic, history of stroke, anemia presents with worsening swelling and pain to his right hand.  Patient has had gangrene in the middle finger in the right hand secondary to decreased blood flow for 2 weeks.  Patient said worsening swelling and drainage the past 2 days.  Patient completed oral antibiotics last week.  Patient denies fever however has had chills.  Patient was told that at some point he would have to have the finger amputated.     Past Medical History:  Diagnosis Date  . Anxiety   . Aortic stenosis   . Arthritis   . CVA (cerebral infarction)   . ESRD (end stage renal disease) on dialysis [facility])    M/W/F at [facility] in [place]  . Essential hypertension    resolved with HD  . Gangrene of right foot ([place])   . Gastric ulcer 2004  . History of cardiomyopathy    LVEF normal as of February 2017  . History of stroke    Left side weakness  . Iron deficiency anemia     Patient Active Problem List   Diagnosis Date Noted  . Severe sepsis ([place]) 06/12/2018  . Leukocytosis 06/12/2018  . MRSA (methicillin resistant staph aureus) culture positive 06/12/2018  . Essential hypertension 06/12/2018  . ESRD (end stage renal disease) ([place]) 06/09/2018  . Gangrene of finger ([place]) 06/04/2018  . S/P transmetatarsal amputation of foot, right ([place]) 05/09/2017  . PVD (peripheral vascular disease) ([place]) 04/15/2017  . Nonischemic cardiomyopathy ([place]) 08/15/2015  . Aortic stenosis 08/15/2015  . Moderate aortic stenosis 07/12/2015  . Non-ischemic  cardiomyopathy- EF 35- 45% 07/11/2015  . CAD- 40-50% LAD at cath 07/11/15 07/11/2015  . Aspirin intolerance 07/11/2015  . Abnormal stress test   . ESRD on dialysis ([place]) 05/30/2015  . CTS (carpal tunnel syndrome) 02/28/2015  . PVD of LE - Dr Oneida Alar follows 08/20/2012  . Midfoot skin ulcer, right, limited to breakdown of skin ([place]) 01/16/2012  . Encounter for screening colonoscopy 10/08/2011  . Melena 10/08/2011  . Other complications due to renal dialysis device, implant, and graft 09/24/2011    Past Surgical History:  Procedure Laterality Date  . ABDOMINAL AORTAGRAM N/A 01/24/2012   Procedure: ABDOMINAL Maxcine Ham;  Surgeon: Elam Dutch, MD;  Location: [facility] CATH LAB;  Service: Cardiovascular;  Laterality: N/A;  . ABDOMINAL AORTOGRAM W/LOWER EXTREMITY N/A 04/15/2017   Procedure: ABDOMINAL AORTOGRAM W/LOWER EXTREMITY;  Surgeon: Angelia Mould, MD;  Location: [place] CV LAB;  Service: Cardiovascular;  Laterality: N/A;  . AMPUTATION Right 05/09/2017   Procedure: RIGHT TRANSMETATARSAL AMPUTATION;  Surgeon: [name], MD;  Location: [place];  Service: Orthopedics;  Laterality: Right;  . ARTERIOVENOUS GRAFT PLACEMENT Right right arm  . AV FISTULA PLACEMENT Left 08/31/2015   Procedure: ARTERIOVENOUS (AV) FISTULA CREATION- LEFT RADIOCEPHALIC;  Surgeon: Mal Misty, MD;  Location: [place];  Service: Vascular;  Laterality: Left;  . AV FISTULA PLACEMENT Right 02/18/2017   Procedure: INSERTION OF ARTERIOVENOUS (AV) GORE-TEX GRAFT  RIGHT UPPER ARM;  Surgeon: Conrad Amagansett, MD;  Location:  [facility] OR;  Service: Vascular;  Laterality: Right;  . BASCILIC VEIN TRANSPOSITION Left 12/19/2015   Procedure: FIRST STAGE BRACHIAL VEIN TRANSPOSITION;  Surgeon: Conrad Wilmore, MD;  Location: [place];  Service: Vascular;  Laterality: Left;  . BASCILIC VEIN TRANSPOSITION Left 02/08/2016   Procedure: SECOND STAGE BRACHIAL VEIN TRANSPOSITION;  Surgeon: Conrad Cl[place], MD;  Location: [place];  Service: Vascular;   Laterality: Left;  . CARDIAC CATHETERIZATION N/A 07/11/2015   Procedure: Left Heart Cath and Coronary Angiography;  Surgeon: [place] Sine, MD;  Location: [place] CV LAB;  Service: Cardiovascular;  Laterality: N/A;  . Carpel Tunnel Left Dec. 22, 2016  . CHOLECYSTECTOMY    . COLONOSCOPY  2004   Dr. Irving Shows, left sided diverticula and cecal polyp, path unknown  . COLONOSCOPY  10/29/2011   Procedure: COLONOSCOPY;  Surgeon: Daneil Dolin, MD;  Location: [facility];  Service: Endoscopy;  Laterality: N/A;  10:15  . ESOPHAGOGASTRODUODENOSCOPY  11/2002   Dr. Gala Romney, erosive reflux esophagitis, multiple gastric ulcer and antral/bulbar erosions. Serologies positive for H.Pylori and was treated  . ESOPHAGOGASTRODUODENOSCOPY  11/20014   Dr. Gala Romney, small hh only, ulcers healed  . ESOPHAGOGASTRODUODENOSCOPY  09/21/2011   Dr Trevor Iha [facility], antral erosions, ?early GAVE  . FISTULOGRAM Left 12/10/2016   Procedure: THROMBECTOMY OF LEFT ARM ARTERIOVENOUS FISTULA;  Surgeon: Waynetta Sandy, MD;  Location: [place];  Service: Vascular;  Laterality: Left;  . INSERTION OF DIALYSIS CATHETER Left 12/10/2016   Procedure: INSERTION OF TUNNELED DIALYSIS CATHETER;  Surgeon: Waynetta Sandy, MD;  Location: [place];  Service: Vascular;  Laterality: Left;  . INSERTION OF DIALYSIS CATHETER Right 06/09/2018   Procedure: INSERTION OF DIALYSIS CATHETER Right subclavian;  Surgeon: Angelia Mould, MD;  Location: [place];  Service: Vascular;  Laterality: Right;  . IR DIALY SHUNT INTRO [place] W/IMG RIGHT Right 01/01/2018  . IR GENERIC HISTORICAL  07/16/2016   IR REMOVAL TUN CV CATH W/O FL 07/16/2016 Saverio Danker, PA-C [facility]-INTERV RAD  . IR PTA ADDL CENTRAL DIALYSIS SEG THRU DIALY CIRCUIT RIGHT Right 10/21/2017  . IR REMOVAL TUN CV CATH W/O FL  05/12/2017  . IR THROMBECTOMY AV FISTULA W/THROMBOLYSIS/PTA INC/SHUNT/IMG RIGHT Right 10/21/2017  . IR THROMBECTOMY AV FISTULA W/THROMBOLYSIS/PTA  INC/SHUNT/IMG RIGHT Right 11/27/2017  . IR US GUIDE VASC ACCESS RIGHT  10/21/2017  . IR US GUIDE VASC ACCESS RIGHT  11/27/2017  . IR US GUIDE VASC ACCESS RIGHT  01/01/2018  . LIGATION ARTERIOVENOUS GORTEX GRAFT Right 06/09/2018  . LIGATION ARTERIOVENOUS GORTEX GRAFT Right 06/09/2018   Procedure: LIGATION ARTERIOVENOUS GORTEX GRAFT RIGHT ARM;  Surgeon: Angelia Mould, MD;  Location: [place];  Service: Vascular;  Laterality: Right;  . LIGATION OF ARTERIOVENOUS  FISTULA Left 12/19/2015   Procedure: LIGATION OF RADIOCEPHALIC ARTERIOVENOUS  FISTULA;  Surgeon: Conrad [place], MD;  Location: [place];  Service: Vascular;  Laterality: Left;  Marland Kitchen MASS EXCISION Right 02/18/2017   Procedure: EXCISION OF RIGHT AXILLARY EPIDERMAL INCLUSION CYST;  Surgeon: Conrad Emington, MD;  Location: [place];  Service: Vascular;  Laterality: Right;  . PERIPHERAL VASCULAR CATHETERIZATION N/A 12/14/2015   Procedure: Fistulagram;  Surgeon: Conrad , MD;  Location: [place] CV LAB;  Service: Cardiovascular;  Laterality: N/A;  . SHOULDER SURGERY    . ULTRASOUND GUIDANCE FOR VASCULAR ACCESS  04/15/2017   Procedure: Ultrasound Guidance For Vascular Access;  Surgeon: Angelia Mould, MD;  Location: [place] CV LAB;  Service: Cardiovascular;;  . UPPER  EXTREMITY VENOGRAPHY Bilateral 12/17/2016   Procedure: Bilateral Upper Extremity Venography;  Surgeon: Serafina Mitchell, MD;  Location: [place] CV LAB;  Service: Cardiovascular;  Laterality: Bilateral;        Home Medications    Prior to Admission medications   Medication Sig Start Date End Date Taking? Authorizing Provider  atorvastatin (LIPITOR) 40 MG tablet Take 40 mg by mouth daily. 11/03/15  Yes [provider]  b complex-vitamin c-folic acid (NEPHRO-VITE) 0.8 MG TABS Take 0.8 mg by mouth See admin instructions. Takes on Tuesdays, Thursdays, Saturdays, and Sundays. Does not take on Mondays, Wednesdays, and Fridays due to Dialysis treatments.   Yes [provider]  cinacalcet (SENSIPAR) 60 MG tablet Take 60 mg by mouth every evening. With evening meal   Yes [provider]  HYDROcodone-acetaminophen (NORCO/VICODIN) 5-325 MG tablet Take 1 tablet by mouth every 4 (four) hours as needed for moderate pain. 05/09/17  Yes [name], MD  ibuprofen (ADVIL,MOTRIN) 200 MG tablet Take 600 mg by mouth every 6 (six) hours as needed for mild pain.   Yes [provider]  oxyCODONE-acetaminophen (PERCOCET/ROXICET) 5-325 MG tablet Take 1 tablet by mouth every 6 (six) hours as needed for severe pain. 05/13/17  Yes [name], MD  Potassium (POTASSIMIN PO) Take 1 tablet by mouth daily.   Yes [provider]  sevelamer (RENVELA) 800 MG tablet Take 1,600-3,200 mg by mouth See admin instructions. Take 3200  mg by mouth 3 times daily with full meals and take 1600 mg by mouth with snacks.   Yes [provider]  traMADol (ULTRAM) 50 MG tablet Take 50 mg by mouth 3 (three) times daily as needed for moderate pain.   Yes [provider]  nitroGLYCERIN (NITRODUR - DOSED IN MG/24 HR) 0.2 mg/hr patch Place 1 patch (0.2 mg total) onto the skin daily. Apply 1 patch to dorsum of foot daily. Patient not taking: Reported on 06/12/2018 05/05/18   Rayburn, Neta Mends, PA-C  ondansetron (ZOFRAN) 8 MG tablet Take 1 tablet (8 mg total) by mouth every 8 (eight) hours as needed for nausea or vomiting. Patient not taking: Reported on 06/09/2018 03/24/17   Daleen Bo, MD  pentoxifylline (TRENTAL) 400 MG CR tablet Take 1 tablet (400 mg total) by mouth 3 (three) times daily with meals. Patient not taking: Reported on 06/09/2018 05/05/18   Rayburn, Neta Mends, PA-C    Family History Family History  Problem Relation Age of Onset  . Hypertension Mother   . Colon cancer Neg Hx   . Liver disease Neg Hx     Social History Social History   Tobacco Use  . Smoking status: Former Smoker    Last attempt to quit: 04/29/2004     Years since quitting: 14.1  . Smokeless tobacco: Former Systems developer    Types: Chew    Quit date: 01/16/1987  . Tobacco comment: quit 2006  Substance Use Topics  . Alcohol use: No  . Drug use: No     Allergies   Aspirin   Review of Systems Review of Systems  Constitutional: Positive for chills. Negative for fever.  HENT: Negative for congestion.   Eyes: Negative for visual disturbance.  Respiratory: Negative for shortness of breath.   Cardiovascular: Negative for chest pain.  Gastrointestinal: Negative for abdominal pain and vomiting.  Genitourinary: Negative for dysuria and flank pain.  Musculoskeletal: Negative for back pain, neck pain and neck stiffness.  Skin: Positive for rash and wound.  Neurological: Positive for light-headedness. Negative for headaches.     Physical Exam Updated Vital Signs BP (!) 120/91   Pulse 98   Temp 99.6 F (37.6 C) (Temporal)   Resp 20   Ht 5\' 5"  (1.651 m)   Wt 77.1 kg   SpO2 100%   BMI 28.29 kg/m   Physical Exam Vitals signs and nursing note reviewed.  Constitutional:      Appearance: He is well-developed.  HENT:     Head: Normocephalic and atraumatic.     Comments: Dry mm Eyes:     General:        Right eye: No discharge.        Left eye: No discharge.     Conjunctiva/sclera: Conjunctivae normal.  Neck:     Musculoskeletal: Normal range of motion and neck supple.     Trachea: No tracheal deviation.  Cardiovascular:     Rate and Rhythm: Normal rate and regular rhythm.  Pulmonary:     Effort: Pulmonary effort is normal.     Breath sounds: Normal breath sounds.  Abdominal:     General: There is no distension.     Palpations: Abdomen is soft.     Tenderness: There is no abdominal tenderness. There is no guarding.  Skin:    General: Skin is warm.     Findings: No rash.     Comments: Gangrene middle finger in the right hand extending from MCP joint.  Significant edema, strong odor and purulent drainage at the base of the MCP  middle finger.  Warmth and edema is extends to the wrist on the right hand.  Difficulty appreciating pulse in the right hand however distal hand warm to palpation.  Neurological:     Mental Status: He is alert and oriented to person, place, and time.      ED Treatments / Results  Labs (all labs ordered are listed, but only abnormal results are displayed) Labs Reviewed  COMPREHENSIVE METABOLIC PANEL - Abnormal; Notable for the following components:      Result Value   Potassium 3.3 (*)    Chloride 96 (*)    Creatinine, Ser 5.07 (*)    Albumin 2.8 (*)    GFR calc non Af Amer 10 (*)    GFR calc Af Amer 12 (*)    All other components within normal limits  CBC WITH DIFFERENTIAL/PLATELET - Abnormal; Notable for the following components:   WBC 23.8 (*)    RBC 6.57 (*)    MCV 64.1 (*)    MCH 20.1 (*)    RDW 19.1 (*)    Neutro Abs 20.5 (*)    Monocytes Absolute 1.7 (*)    Abs Immature Granulocytes 0.16 (*)    All other components within normal limits  LACTIC ACID, PLASMA - Abnormal; Notable for the following components:   Lactic Acid, Venous 3.3 (*)    All other components within normal limits  LACTIC ACID, PLASMA - Abnormal; Notable for the following components:   Lactic Acid, Venous 2.7 (*)    All other components within normal limits  CULTURE, BLOOD (ROUTINE X 2)  CULTURE, BLOOD (ROUTINE X 2)    EKG EKG Interpretation  Date/Time:  Friday June 12 2018 11:35:39 EST Ventricular Rate:  122 PR Interval:    QRS Duration: 154 QT Interval:  358 QTC Calculation: 469 R Axis:   -90 Text Interpretation:  Sinus tachycardia Paired ventricular premature complexes Right bundle branch block Probable inferior infarct, recent poor  baseline Confirmed by Elnora Morrison [phone]) on 06/12/2018 1:11:40 PM   Radiology Dg Hand 2 View Right  Result Date: 06/12/2018 CLINICAL DATA:  Acute right hand pain and swelling. EXAM: RIGHT HAND - 2 VIEW COMPARISON:  None. FINDINGS: There is no evidence  of fracture or dislocation. There is no evidence of arthropathy or other focal bone abnormality. Vascular calcifications are noted. Dorsal soft tissue swelling is noted which contains gas consistent with cellulitis. Severe enlargement of the third and fourth fingers is noted consistent with infection. Gas is present and gangrene can not be excluded. IMPRESSION: Dorsal soft tissue swelling is noted which contains gas consistent with cellulitis. Severe soft tissue enlargement of the third and fourth fingers is noted as well which contains gas concerning for cellulitis or possibly gangrene. No definite lytic destruction is seen to suggest osteomyelitis. Electronically Signed   By: Marijo Conception, M.D.   On: 06/12/2018 13:56    Procedures .Critical Care Performed by: Elnora Morrison, MD Authorized by: Elnora Morrison, MD   Critical care provider statement:    Critical care time (minutes):  80   Critical care start time:  06/12/2018 11:30 PM   Critical care end time:  06/12/2018 12:50 PM   Critical care time was exclusive of:  Separately billable procedures and treating other patients and teaching time   Critical care was necessary to treat or prevent imminent or life-threatening deterioration of the following conditions:  Sepsis   Critical care was time spent personally by me on the following activities:  Discussions with consultants, evaluation of patient's response to treatment, examination of patient, ordering and performing treatments and interventions, ordering and review of laboratory studies, ordering and review of radiographic studies, pulse oximetry, re-evaluation of patient's condition, obtaining history from patient or surrogate and review of old charts  Ultrasound ED Peripheral IV (Provider) Date/Time: 06/12/2018 12:56 PM Performed by: Elnora Morrison, MD Authorized by: Elnora Morrison, MD   Procedure details:    Indications: hypotension, multiple failed IV attempts and poor IV access      Skin Prep: chlorhexidine gluconate     Location:  Left AC   Angiocath:  18 G   Bedside Ultrasound Guided: Yes     Images: archived     Patient tolerated procedure without complications: Yes     Dressing applied: Yes   Ultrasound ED Peripheral IV (Provider) Date/Time: 06/12/2018 12:56 PM Performed by: Elnora Morrison, MD Authorized by: Elnora Morrison, MD   Procedure details:    Indications: hypotension     Skin Prep: chlorhexidine gluconate     Location:  Left anterior forearm   Angiocath:  20 G   Bedside Ultrasound Guided: Yes     Images: archived     Patient tolerated procedure without complications: Yes     Dressing applied: Yes     (including critical care time)  Medications Ordered in ED Medications  piperacillin-tazobactam (ZOSYN) IVPB 3.375 g (3.375 g Intravenous New Bag/Given 06/12/18 1345)  vancomycin (VANCOCIN) IVPB 750 mg/150 ml premix (has no administration in time range)  0.9 %  sodium chloride infusion ( Intravenous New Bag/Given 06/12/18 1505)  HYDROmorphone (DILAUDID) injection 0.5 mg (0.5 mg Intravenous Given 06/12/18 1507)  sodium chloride 0.9 % bolus 1,000 mL (0 mLs Intravenous Stopped 06/12/18 1331)    And  sodium chloride 0.9 % bolus 1,000 mL (0 mLs Intravenous Stopped 06/12/18 1430)    And  sodium chloride 0.9 % bolus 500 mL (0 mLs Intravenous Stopped 06/12/18 1502)  vancomycin (  VANCOCIN) IVPB 1000 mg/200 mL premix (0 mg Intravenous Stopped 06/12/18 1344)     Initial Impression / Assessment and Plan / ED Course  I have reviewed the triage vital signs and the nursing notes.  Pertinent labs & imaging results that were available during my care of the patient were reviewed by me and considered in my medical decision making (see chart for details).    Patient with vascular disease history presents with hypotension and significant clinical infection to his right hand with gangrene.  Sepsis order set and code sepsis called immediately.  30 cc/kg ordered IV fluids.   Patient had dialysis this morning.  Broad antibiotics IV ordered vancomycin and Zosyn.  Plan to transfer to [facility] for medicine admission and hand surgery consult.  Difficult IV, to ultrasound-guided IVs placed by myself.  Discussed with tried hospitalist for admission/transfer to [facility].  Hand surgeon consult pending, they will need to see the patient when he arrives at [facility].  Discussed with Dr. Jess Barters colleague on call who recommends calling back hand surgery to help manage the case.  Re-paged Dr. Lenon Curt. BP improving in the ED with IV fluids.  Sepsis - Repeat Assessment  Performed at:    300 pm  Vitals     Blood pressure (!) 143/48, pulse (!) 50, temperature 99.6 F (37.6 C), temperature source Temporal, resp. rate (!) 21, height 5\' 5"  (1.651 m), weight 77.1 kg, SpO2 100 %.  Heart:     Regular rate and rhythm  Lungs:    CTA  Capillary Refill:   <2 sec  Peripheral Pulse:   Radial pulse palpable  Skin:     gangrene finger, warm skin otherwise   The patients results and plan were reviewed and discussed.   Any x-rays performed were independently reviewed by myself.   Differential diagnosis were considered with the presenting HPI.  Medications  piperacillin-tazobactam (ZOSYN) IVPB 3.375 g (3.375 g Intravenous New Bag/Given 06/12/18 1345)  vancomycin (VANCOCIN) IVPB 750 mg/150 ml premix (has no administration in time range)  0.9 %  sodium chloride infusion ( Intravenous New Bag/Given 06/12/18 1505)  HYDROmorphone (DILAUDID) injection 0.5 mg (0.5 mg Intravenous Given 06/12/18 1507)  sodium chloride 0.9 % bolus 1,000 mL (0 mLs Intravenous Stopped 06/12/18 1331)    And  sodium chloride 0.9 % bolus 1,000 mL (0 mLs Intravenous Stopped 06/12/18 1430)    And  sodium chloride 0.9 % bolus 500 mL (0 mLs Intravenous Stopped 06/12/18 1502)  vancomycin (VANCOCIN) IVPB 1000 mg/200 mL premix (0 mg Intravenous Stopped 06/12/18 1344)    Vitals:   06/12/18 1300 06/12/18 1330 06/12/18  1400 06/12/18 1507  BP: (!) 132/48 (!) 143/48 (!) 121/40 (!) 120/91  Pulse:  (!) 50 (!) 49 98  Resp: 19 (!) 21 (!) 22 20  Temp:      TempSrc:      SpO2:  100% 99% 100%  Weight:      Height:        Final diagnoses:  Gangrene of finger of right hand (HCC)  Sepsis due to cellulitis (HCC)  Abscess of hand including fingers, right    Admission/ observation were discussed with the admitting physician, patient and/or family and they are comfortable with the plan.    Final Clinical Impressions(s) / ED Diagnoses   Final diagnoses:  Gangrene of finger of right hand ([place])  Sepsis due to cellulitis ([place])  Abscess of hand including fingers, right    ED Discharge Orders  None       Elnora Morrison, MD 06/12/18 1626

## 2018-06-12 NOTE — ED Triage Notes (Signed)
Pain to and swelling to RT hand for 2-3 weeks, reports middle finger is black in color

## 2018-06-12 NOTE — ED Notes (Signed)
CRITICAL VALUE ALERT  Critical Value:  Lactic Acid 2.7  Date & Time Notied:  06/12/2018 @ 1534  Provider Notified: Dr Wynetta Emery  Orders Received/Actions taken: MD informed

## 2018-06-12 NOTE — ED Notes (Signed)
Blood culture done x2.

## 2018-06-12 NOTE — Progress Notes (Addendum)
Pharmacy Antibiotic Note  [name]. is a 78 y.o. male admitted on 06/12/2018 with gangrene/finger infection.  Pharmacy has been consulted for Zosyn and Vanco dosing.  Plan: Vancomycin 1000 mg IV x 1 dose Vancomycing 750 mg IV MWF post-HD Zosyn 3.375g IV every 12 hours Monitor labs, c/s, and patient improvement  Height: 5\' 5"  (165.1 cm) Weight: 170 lb (77.1 kg) IBW/kg (Calculated) : 61.5  Temp (24hrs), Avg:99.6 F (37.6 C), Min:99.6 F (37.6 C), Max:99.6 F (37.6 C)  Recent Labs  Lab 06/09/18 1232 06/12/18 1141 06/12/18 1142  WBC 26.4* 23.8*  --   CREATININE 8.08* 5.07*  --   LATICACIDVEN  --   --  3.3*    Estimated Creatinine Clearance: 12.1 mL/min (A) (by C-G formula based on SCr of 5.07 mg/dL (H)).    Allergies  Allergen Reactions  . Aspirin Other (See Comments)    INTERNAL BLEEDING Causes internal bleeding  Other Reaction: history of ulcers    Antimicrobials this admission: Vanco 2/14 >> Zosyn 2/14 >>   Dose adjustments this admission: Zosyn  Microbiology results: 2/14 BCx: pending 2/14 UCx: pending       Thank you for allowing pharmacy to be a part of this patient's care.  Ramond Craver 06/12/2018 1:58 PM

## 2018-06-12 NOTE — ED Notes (Signed)
Date and time results received: 06/12/18 1315  Test: Lactic Acid Critical Value: 3.3  Name of Provider Notified: Dr. Reather Converse  Orders Received? Or Actions Taken?: See chart

## 2018-06-13 ENCOUNTER — Inpatient Hospital Stay (HOSPITAL_COMMUNITY): Payer: Medicare Other | Admitting: Anesthesiology

## 2018-06-13 ENCOUNTER — Encounter (HOSPITAL_COMMUNITY)
Admission: EM | Disposition: A | Discharge status: Skilled Nursing Facility | Payer: Self-pay | Source: Home / Self Care | Attending: Internal Medicine

## 2018-06-13 DIAGNOSIS — I2510 Atherosclerotic heart disease of native coronary artery without angina pectoris: Secondary | ICD-10-CM

## 2018-06-13 DIAGNOSIS — M8628 Subacute osteomyelitis, other site: Secondary | ICD-10-CM | POA: Diagnosis not present

## 2018-06-13 HISTORY — PX: AMPUTATION: SHX166

## 2018-06-13 LAB — BASIC METABOLIC PANEL
Anion gap: 11 (ref 5–15)
BUN: 33 mg/dL — ABNORMAL HIGH (ref 8–23)
CHLORIDE: 100 mmol/L (ref 98–111)
CO2: 21 mmol/L — ABNORMAL LOW (ref 22–32)
Calcium: 8.7 mg/dL — ABNORMAL LOW (ref 8.9–10.3)
Creatinine, Ser: 7.5 mg/dL — ABNORMAL HIGH (ref 0.61–1.24)
GFR calc Af Amer: 7 mL/min — ABNORMAL LOW (ref >60)
GFR calc non Af Amer: 6 mL/min — ABNORMAL LOW (ref >60)
Glucose, Bld: 94 mg/dL (ref 70–99)
Potassium: 3.9 mmol/L (ref 3.5–5.1)
Sodium: 132 mmol/L — ABNORMAL LOW (ref 135–145)

## 2018-06-13 LAB — COMPREHENSIVE METABOLIC PANEL
ALBUMIN: 2.2 g/dL — AB (ref 3.5–5.0)
ALT: 7 U/L (ref 0–44)
AST: 18 U/L (ref 15–41)
Alkaline Phosphatase: 70 U/L (ref 38–126)
Anion gap: 11 (ref 5–15)
BUN: 25 mg/dL — ABNORMAL HIGH (ref 8–23)
CO2: 25 mmol/L (ref 22–32)
Calcium: 9.4 mg/dL (ref 8.9–10.3)
Chloride: 100 mmol/L (ref 98–111)
Creatinine, Ser: 6.2 mg/dL — ABNORMAL HIGH (ref 0.61–1.24)
GFR calc Af Amer: 9 mL/min — ABNORMAL LOW (ref >60)
GFR calc non Af Amer: 8 mL/min — ABNORMAL LOW (ref >60)
Glucose, Bld: 105 mg/dL — ABNORMAL HIGH (ref 70–99)
Potassium: 3.7 mmol/L (ref 3.5–5.1)
Sodium: 136 mmol/L (ref 135–145)
Total Bilirubin: 0.8 mg/dL (ref 0.3–1.2)
Total Protein: 7.2 g/dL (ref 6.5–8.1)

## 2018-06-13 LAB — MAGNESIUM
Magnesium: 1.9 mg/dL (ref 1.7–2.4)
Magnesium: 1.8 mg/dL (ref 1.7–2.4)
Magnesium: 1.8 mg/dL (ref 1.7–2.4)

## 2018-06-13 LAB — CBC WITH DIFFERENTIAL/PLATELET
ABS IMMATURE GRANULOCYTES: 0.13 10*3/uL — AB (ref 0.00–0.07)
Basophils Absolute: 0.1 10*3/uL (ref 0.0–0.1)
Basophils Relative: 0 %
Eosinophils Absolute: 0.2 10*3/uL (ref 0.0–0.5)
Eosinophils Relative: 1 %
HCT: 37.6 % — ABNORMAL LOW (ref 39.0–52.0)
Hemoglobin: 11.8 g/dL — ABNORMAL LOW (ref 13.0–17.0)
IMMATURE GRANULOCYTES: 1 %
LYMPHS PCT: 6 %
Lymphs Abs: 1.2 10*3/uL (ref 0.7–4.0)
MCH: 19.8 pg — ABNORMAL LOW (ref 26.0–34.0)
MCHC: 31.4 g/dL (ref 30.0–36.0)
MCV: 63.1 fL — ABNORMAL LOW (ref 80.0–100.0)
Monocytes Absolute: 1.3 10*3/uL — ABNORMAL HIGH (ref 0.1–1.0)
Monocytes Relative: 7 %
Neutro Abs: 16.2 10*3/uL — ABNORMAL HIGH (ref 1.7–7.7)
Neutrophils Relative %: 85 %
Platelets: 350 10*3/uL (ref 150–400)
RBC: 5.96 MIL/uL — ABNORMAL HIGH (ref 4.22–5.81)
RDW: 18 % — ABNORMAL HIGH (ref 11.5–15.5)
WBC: 19 10*3/uL — ABNORMAL HIGH (ref 4.0–10.5)
nRBC: 0 % (ref 0.0–0.2)

## 2018-06-13 LAB — RENAL FUNCTION PANEL
ANION GAP: 13 (ref 5–15)
Albumin: 2.1 g/dL — ABNORMAL LOW (ref 3.5–5.0)
BUN: 25 mg/dL — ABNORMAL HIGH (ref 8–23)
CO2: 23 mmol/L (ref 22–32)
Calcium: 9.4 mg/dL (ref 8.9–10.3)
Chloride: 101 mmol/L (ref 98–111)
Creatinine, Ser: 6.47 mg/dL — ABNORMAL HIGH (ref 0.61–1.24)
GFR calc Af Amer: 9 mL/min — ABNORMAL LOW (ref >60)
GFR calc non Af Amer: 8 mL/min — ABNORMAL LOW (ref >60)
Glucose, Bld: 87 mg/dL (ref 70–99)
Phosphorus: 3.8 mg/dL (ref 2.5–4.6)
Potassium: 3.8 mmol/L (ref 3.5–5.1)
Sodium: 137 mmol/L (ref 135–145)

## 2018-06-13 LAB — LACTIC ACID, PLASMA: Lactic Acid, Venous: 1.6 mmol/L (ref 0.5–1.9)

## 2018-06-13 LAB — GLUCOSE, CAPILLARY: Glucose-Capillary: 74 mg/dL (ref 70–99)

## 2018-06-13 SURGERY — AMPUTATION DIGIT
Anesthesia: Monitor Anesthesia Care | Site: Ring Finger | Laterality: Right

## 2018-06-13 MED ORDER — ONDANSETRON HCL 4 MG/2ML IJ SOLN
INTRAMUSCULAR | Status: DC | PRN
  Administered 2018-06-13: 4 mg via INTRAVENOUS

## 2018-06-13 MED ORDER — SODIUM CHLORIDE 0.9 % IV BOLUS
250.0000 mL | Freq: Once | INTRAVENOUS | Status: AC
  Administered 2018-06-13: 250 mL via INTRAVENOUS

## 2018-06-13 MED ORDER — ROPIVACAINE HCL 5 MG/ML IJ SOLN
INTRAMUSCULAR | Status: DC | PRN
  Administered 2018-06-13: 30 mL via PERINEURAL

## 2018-06-13 MED ORDER — SODIUM CHLORIDE 0.9 % IV BOLUS: 250.0000 mL | Freq: Once | INTRAVENOUS | Status: DC

## 2018-06-13 MED ORDER — FENTANYL CITRATE (PF) 250 MCG/5ML IJ SOLN
INTRAMUSCULAR | Status: AC
  Filled 2018-06-13: qty 5

## 2018-06-13 MED ORDER — MIDAZOLAM HCL 2 MG/2ML IJ SOLN
INTRAMUSCULAR | Status: DC | PRN
  Administered 2018-06-13 (×2): 0.5 mg via INTRAVENOUS

## 2018-06-13 MED ORDER — PROPOFOL 10 MG/ML IV BOLUS
INTRAVENOUS | Status: AC
  Filled 2018-06-13: qty 20

## 2018-06-13 MED ORDER — MIDAZOLAM HCL 2 MG/2ML IJ SOLN
INTRAMUSCULAR | Status: AC
  Filled 2018-06-13: qty 2

## 2018-06-13 MED ORDER — 0.9 % SODIUM CHLORIDE (POUR BTL) OPTIME
TOPICAL | Status: DC | PRN
  Administered 2018-06-13: 1000 mL

## 2018-06-13 MED ORDER — SODIUM CHLORIDE 0.9 % IV SOLN: INTRAVENOUS | Status: DC

## 2018-06-13 MED ORDER — SODIUM CHLORIDE 0.9 % IV SOLN
INTRAVENOUS | Status: DC | PRN
  Administered 2018-06-13: 10:00:00 via INTRAVENOUS

## 2018-06-13 MED ORDER — PHENYLEPHRINE HCL 10 MG/ML IJ SOLN
INTRAMUSCULAR | Status: DC | PRN
  Administered 2018-06-13 (×2): 40 ug via INTRAVENOUS

## 2018-06-13 MED ORDER — FENTANYL CITRATE (PF) 250 MCG/5ML IJ SOLN
INTRAMUSCULAR | Status: DC | PRN
  Administered 2018-06-13: 50 ug via INTRAVENOUS
  Administered 2018-06-13 (×2): 25 ug via INTRAVENOUS

## 2018-06-13 MED ORDER — SODIUM CHLORIDE 0.9 % IV BOLUS
500.0000 mL | Freq: Once | INTRAVENOUS | Status: AC
  Administered 2018-06-13: 500 mL via INTRAVENOUS

## 2018-06-13 SURGICAL SUPPLY — 37 items
BANDAGE ACE 4X5 VEL STRL LF (GAUZE/BANDAGES/DRESSINGS) ×3 IMPLANT
BANDAGE ELASTIC 3 VELCRO ST LF (GAUZE/BANDAGES/DRESSINGS) ×2 IMPLANT
BNDG CMPR 9X4 STRL LF SNTH (GAUZE/BANDAGES/DRESSINGS) ×1
BNDG ESMARK 4X9 LF (GAUZE/BANDAGES/DRESSINGS) ×3 IMPLANT
BNDG GAUZE ELAST 4 BULKY (GAUZE/BANDAGES/DRESSINGS) ×5 IMPLANT
CHLORAPREP W/TINT 26ML (MISCELLANEOUS) ×3 IMPLANT
CORDS BIPOLAR (ELECTRODE) ×3 IMPLANT
COVER WAND RF STERILE (DRAPES) ×3 IMPLANT
DRSG ADAPTIC 3X8 NADH LF (GAUZE/BANDAGES/DRESSINGS) ×3 IMPLANT
ELECT REM PT RETURN 9FT ADLT (ELECTROSURGICAL) ×3
ELECTRODE REM PT RTRN 9FT ADLT (ELECTROSURGICAL) IMPLANT
GAUZE SPONGE 4X4 12PLY STRL (GAUZE/BANDAGES/DRESSINGS) ×5 IMPLANT
GAUZE XEROFORM 1X8 LF (GAUZE/BANDAGES/DRESSINGS) ×5 IMPLANT
GLOVE BIOGEL M 8.0 STRL (GLOVE) ×3 IMPLANT
GOWN STRL REUS W/ TWL LRG LVL3 (GOWN DISPOSABLE) ×1 IMPLANT
GOWN STRL REUS W/ TWL XL LVL3 (GOWN DISPOSABLE) ×1 IMPLANT
GOWN STRL REUS W/TWL LRG LVL3 (GOWN DISPOSABLE) ×3
GOWN STRL REUS W/TWL XL LVL3 (GOWN DISPOSABLE) ×3
JET LAVAGE IRRISEPT WOUND (IRRIGATION / IRRIGATOR) ×3
KIT BASIN OR (CUSTOM PROCEDURE TRAY) ×3 IMPLANT
KIT TURNOVER KIT B (KITS) ×3 IMPLANT
LAVAGE JET IRRISEPT WOUND (IRRIGATION / IRRIGATOR) IMPLANT
NS IRRIG 1000ML POUR BTL (IV SOLUTION) ×3 IMPLANT
PACK ORTHO EXTREMITY (CUSTOM PROCEDURE TRAY) ×3 IMPLANT
PAD ARMBOARD 7.5X6 YLW CONV (MISCELLANEOUS) ×6 IMPLANT
PAD CAST 4YDX4 CTTN HI CHSV (CAST SUPPLIES) ×1 IMPLANT
PADDING CAST COTTON 4X4 STRL (CAST SUPPLIES) ×3
SOAP 2 % CHG 4 OZ (WOUND CARE) ×3 IMPLANT
SPONGE LAP 18X18 X RAY DECT (DISPOSABLE) ×3 IMPLANT
SPONGE LAP 4X18 RFD (DISPOSABLE) ×3 IMPLANT
SUT ETHILON 2 0 FS 18 (SUTURE) ×2 IMPLANT
TOWEL OR 17X24 6PK STRL BLUE (TOWEL DISPOSABLE) ×3 IMPLANT
TOWEL OR 17X26 10 PK STRL BLUE (TOWEL DISPOSABLE) ×3 IMPLANT
TUBE CONNECTING 12'X1/4 (SUCTIONS) ×1
TUBE CONNECTING 12X1/4 (SUCTIONS) ×1 IMPLANT
WATER STERILE IRR 1000ML POUR (IV SOLUTION) ×3 IMPLANT
YANKAUER SUCT BULB TIP NO VENT (SUCTIONS) ×2 IMPLANT

## 2018-06-13 NOTE — Transfer of Care (Signed)
Immediate Anesthesia Transfer of Care Note  Patient: Joseph URETA Sr.  Procedure(s) Performed: AMPUTATION RIGHT LONG FINGER (Right Ring Finger)  Patient Location: PACU  Anesthesia Type:MAC  Level of Consciousness: awake, alert  and oriented  Airway & Oxygen Therapy: Patient Spontanous Breathing and Patient connected to nasal cannula oxygen  Post-op Assessment: Report given to RN and Post -op Vital signs reviewed and stable  Post vital signs: Reviewed and stable  Last Vitals:  Vitals Value Taken Time  BP 126/64 06/13/2018 10:35 AM  Temp 36.5 C 06/13/2018 10:35 AM  Pulse 94 06/13/2018 10:44 AM  Resp 21 06/13/2018 10:44 AM  SpO2 100 % 06/13/2018 10:44 AM  Vitals shown include unvalidated device data.  Last Pain:  Vitals:   06/13/18 0543  TempSrc:   PainSc: 0-No pain         Complications: No apparent anesthesia complications

## 2018-06-13 NOTE — Progress Notes (Signed)
Pt alert and oriented x4, in bed, BP 85/62 HR map 71, HR 84,  Sat 100% on 2L, pt asymptomatic no complaints  Shortness of breath . Pt DO notify of the low blood pressure; order are to continue to monitor pt. Rapid respond nurse notify .

## 2018-06-13 NOTE — Anesthesia Postprocedure Evaluation (Signed)
Anesthesia Post Note  Patient: Joseph VILORIA Sr.  Procedure(s) Performed: AMPUTATION RIGHT LONG FINGER (Right Ring Finger)     Patient location during evaluation: PACU Anesthesia Type: Regional and MAC Level of consciousness: awake and alert Pain management: pain level controlled Vital Signs Assessment: post-procedure vital signs reviewed and stable Respiratory status: spontaneous breathing, nonlabored ventilation, respiratory function stable and patient connected to nasal cannula oxygen Cardiovascular status: stable and blood pressure returned to baseline Postop Assessment: no apparent nausea or vomiting Anesthetic complications: no    Last Vitals:  Vitals:   06/13/18 1051 06/13/18 1108  BP: (!) 118/50 (!) 120/54  Pulse: 98 98  Resp: 18 18  Temp: 36.5 C 36.4 C  SpO2: 100% 98%    Last Pain:  Vitals:   06/13/18 1108  TempSrc: Oral  PainSc: 0-No pain                 Jaime Dome,W. EDMOND

## 2018-06-13 NOTE — Consult Note (Signed)
Subjective: Patient says he actually feels pretty well this morning his main complaint yesterday was pain  Asked by Dr. Wynetta Emery to see patient after recent graft ligation.  My partner Dr. Doren Custard ligated his right upper arm AV graft a few days ago.  The patient reports no erythema or drainage from the incision.  He has known gangrenous changes of fingers in his hands and has been waiting for hand surgery evaluation of this.  Physical exam:  Vitals:   06/13/18 0019 06/13/18 0124 06/13/18 0346 06/13/18 0742  BP: 99/80 (!) 78/43 (!) 103/42 (!) 111/43  Pulse: (!) 58  93   Resp: 19 19 17  (!) 21  Temp: 98.1 F (36.7 C)  98.3 F (36.8 C)   TempSrc: Oral  Oral   SpO2:   96%   Weight:      Height:       Right upper extremity healing incision over proximal biceps no erythema no drainage no swelling  Assessment: Doing well from vascular standpoint incision healing over recent AV graft ligation.  Plan: Patient still needs to have follow-up with hand surgery for evaluation of amputation of fingers.  Vascular surgery will sign off call if questions  [name], MD [facility] Office: [phone] Pager: [phone]

## 2018-06-13 NOTE — Consult Note (Addendum)
Renal Service Consult Note [facility]  [place]. 06/13/2018 Sol Blazing Requesting Physician:  Dr Starla Link  Reason for Consult:  ESRD pt w/ gangrene of R hand HPI: The patient is a 78 y.o. year-old with hx of CVA, cardiomyopathy, PUD, HTN, PAD s/p R TMA, AS, anxiety who been suffering w/ gangrene of the R hand.  His R arm AVG was ligated recently on 2/12 for this reason to see if it would help.  His hand however is worsening and he was admitted yesterday for severe pain the 2nd finger w/ swelling and drainage.  +mal odor as well. Pt's BP in ED was low in 80's. WBC was 23k. Pt was admitted and given IV vanc/zosyn. Today he went for R 2nd finger amputation surgery.  We are asked to see for ESRD.    Patient states he has been on HD for 24 yrs.  Gets HD MWF at [facility], has not missed any recent HD.  No sob or cough, no CP, no N/V/D.    ROS  denies CP  no joint pain   no HA  no blurry vision  no rash  no diarrhea  no nausea/ vomiting   Past Medical History  Past Medical History:  Diagnosis Date  . Anxiety   . Aortic stenosis   . Arthritis   . CVA (cerebral infarction)   . ESRD (end stage renal disease) on dialysis [facility])    M/W/F at [facility] in [place]  . Essential hypertension    resolved with HD  . Gangrene of right foot ([place])   . Gastric ulcer 2004  . History of cardiomyopathy    LVEF normal as of February 2017  . History of stroke    Left side weakness  . Iron deficiency anemia    Past Surgical History  Past Surgical History:  Procedure Laterality Date  . ABDOMINAL AORTAGRAM N/A 01/24/2012   Procedure: ABDOMINAL Maxcine Ham;  Surgeon: Elam Dutch, MD;  Location: [facility] CATH LAB;  Service: Cardiovascular;  Laterality: N/A;  . ABDOMINAL AORTOGRAM W/LOWER EXTREMITY N/A 04/15/2017   Procedure: ABDOMINAL AORTOGRAM W/LOWER EXTREMITY;  Surgeon: Angelia Mould, MD;  Location: [place] CV LAB;  Service: Cardiovascular;  Laterality: N/A;   . AMPUTATION Right 05/09/2017   Procedure: RIGHT TRANSMETATARSAL AMPUTATION;  Surgeon: [name], MD;  Location: [place];  Service: Orthopedics;  Laterality: Right;  . ARTERIOVENOUS GRAFT PLACEMENT Right right arm  . AV FISTULA PLACEMENT Left 08/31/2015   Procedure: ARTERIOVENOUS (AV) FISTULA CREATION- LEFT RADIOCEPHALIC;  Surgeon: Mal Misty, MD;  Location: [place];  Service: Vascular;  Laterality: Left;  . AV FISTULA PLACEMENT Right 02/18/2017   Procedure: INSERTION OF ARTERIOVENOUS (AV) GORE-TEX GRAFT  RIGHT UPPER ARM;  Surgeon: Conrad Dalzell, MD;  Location: [place];  Service: Vascular;  Laterality: Right;  . BASCILIC VEIN TRANSPOSITION Left 12/19/2015   Procedure: FIRST STAGE BRACHIAL VEIN TRANSPOSITION;  Surgeon: Conrad Perry, MD;  Location: [place];  Service: Vascular;  Laterality: Left;  . BASCILIC VEIN TRANSPOSITION Left 02/08/2016   Procedure: SECOND STAGE BRACHIAL VEIN TRANSPOSITION;  Surgeon: Conrad Chalfant, MD;  Location: [place];  Service: Vascular;  Laterality: Left;  . CARDIAC CATHETERIZATION N/A 07/11/2015   Procedure: Left Heart Cath and Coronary Angiography;  Surgeon: Troy Sine, MD;  Location: [place] CV LAB;  Service: Cardiovascular;  Laterality: N/A;  . Carpel Tunnel Left Dec. 22, 2016  . CHOLECYSTECTOMY    . COLONOSCOPY  2004   Dr. Irving Shows, left sided diverticula and cecal polyp, path unknown  . COLONOSCOPY  10/29/2011   Procedure: COLONOSCOPY;  Surgeon: Daneil Dolin, MD;  [facility];  Service: Endoscopy;  Laterality: N/A;  10:15  . ESOPHAGOGASTRODUODENOSCOPY  11/2002   Dr. Gala Romney, erosive reflux esophagitis, multiple gastric ulcer and antral/bulbar erosions. Serologies positive for H.Pylori and was treated  . ESOPHAGOGASTRODUODENOSCOPY  11/20014   Dr. Gala Romney, small hh only, ulcers healed  . ESOPHAGOGASTRODUODENOSCOPY  09/21/2011   Dr Trevor Iha [facility], antral erosions, ?early GAVE  . FISTULOGRAM Left 12/10/2016   Procedure: THROMBECTOMY OF LEFT ARM  ARTERIOVENOUS FISTULA;  Surgeon: Waynetta Sandy, MD;  Location: [place];  Service: Vascular;  Laterality: Left;  . INSERTION OF DIALYSIS CATHETER Left 12/10/2016   Procedure: INSERTION OF TUNNELED DIALYSIS CATHETER;  Surgeon: Waynetta Sandy, MD;  Location: [place];  Service: Vascular;  Laterality: Left;  . INSERTION OF DIALYSIS CATHETER Right 06/09/2018   Procedure: INSERTION OF DIALYSIS CATHETER Right subclavian;  Surgeon: Angelia Mould, MD;  Location: [place];  Service: Vascular;  Laterality: Right;  . IR DIALY SHUNT INTRO [place] W/IMG RIGHT Right 01/01/2018  . IR GENERIC HISTORICAL  07/16/2016   IR REMOVAL TUN CV CATH W/O FL 07/16/2016 Saverio Danker, PA-C [facility]-INTERV RAD  . IR PTA ADDL CENTRAL DIALYSIS SEG THRU DIALY CIRCUIT RIGHT Right 10/21/2017  . IR REMOVAL TUN CV CATH W/O FL  05/12/2017  . IR THROMBECTOMY AV FISTULA W/THROMBOLYSIS/PTA INC/SHUNT/IMG RIGHT Right 10/21/2017  . IR THROMBECTOMY AV FISTULA W/THROMBOLYSIS/PTA INC/SHUNT/IMG RIGHT Right 11/27/2017  . IR US GUIDE VASC ACCESS RIGHT  10/21/2017  . IR US GUIDE VASC ACCESS RIGHT  11/27/2017  . IR US GUIDE VASC ACCESS RIGHT  01/01/2018  . LIGATION ARTERIOVENOUS GORTEX GRAFT Right 06/09/2018  . LIGATION ARTERIOVENOUS GORTEX GRAFT Right 06/09/2018   Procedure: LIGATION ARTERIOVENOUS GORTEX GRAFT RIGHT ARM;  Surgeon: Angelia Mould, MD;  Location: [place];  Service: Vascular;  Laterality: Right;  . LIGATION OF ARTERIOVENOUS  FISTULA Left 12/19/2015   Procedure: LIGATION OF RADIOCEPHALIC ARTERIOVENOUS  FISTULA;  Surgeon: Conrad Astoria, MD;  Location: [place];  Service: Vascular;  Laterality: Left;  Marland Kitchen MASS EXCISION Right 02/18/2017   Procedure: EXCISION OF RIGHT AXILLARY EPIDERMAL INCLUSION CYST;  Surgeon: Conrad Douglassville, MD;  Location: [place];  Service: Vascular;  Laterality: Right;  . PERIPHERAL VASCULAR CATHETERIZATION N/A 12/14/2015   Procedure: Fistulagram;  Surgeon: Conrad Boonton, MD;  Location: [place] CV  LAB;  Service: Cardiovascular;  Laterality: N/A;  . SHOULDER SURGERY    . ULTRASOUND GUIDANCE FOR VASCULAR ACCESS  04/15/2017   Procedure: Ultrasound Guidance For Vascular Access;  Surgeon: Angelia Mould, MD;  Location: [place] CV LAB;  Service: Cardiovascular;;  . UPPER EXTREMITY VENOGRAPHY Bilateral 12/17/2016   Procedure: Bilateral Upper Extremity Venography;  Surgeon: Serafina Mitchell, MD;  Location: [place] CV LAB;  Service: Cardiovascular;  Laterality: Bilateral;   Family History  Family History  Problem Relation Age of Onset  . Hypertension Mother   . Colon cancer Neg Hx   . Liver disease Neg Hx    Social History  reports that he quit smoking about 14 years ago. He quit smokeless tobacco use about 31 years ago.  His smokeless tobacco use included chew. He reports that he does not drink alcohol or use drugs. Allergies  Allergies  Allergen Reactions  . Aspirin Other (See Comments)    INTERNAL BLEEDING Causes  internal bleeding  Other Reaction: history of ulcers   Home medications Prior to Admission medications   Medication Sig Start Date End Date Taking? Authorizing Provider  atorvastatin (LIPITOR) 40 MG tablet Take 40 mg by mouth daily. 11/03/15  Yes [provider]  b complex-vitamin c-folic acid (NEPHRO-VITE) 0.8 MG TABS Take 0.8 mg by mouth See admin instructions. Takes on Tuesdays, Thursdays, Saturdays, and Sundays. Does not take on Mondays, Wednesdays, and Fridays due to Dialysis treatments.   Yes [provider]  cinacalcet (SENSIPAR) 60 MG tablet Take 60 mg by mouth every evening. With evening meal   Yes [provider]  HYDROcodone-acetaminophen (NORCO/VICODIN) 5-325 MG tablet Take 1 tablet by mouth every 4 (four) hours as needed for moderate pain. 05/09/17  Yes [name], MD  ibuprofen (ADVIL,MOTRIN) 200 MG tablet Take 600 mg by mouth every 6 (six) hours as needed for mild pain.   Yes [provider]   oxyCODONE-acetaminophen (PERCOCET/ROXICET) 5-325 MG tablet Take 1 tablet by mouth every 6 (six) hours as needed for severe pain. 05/13/17  Yes [name], MD  Potassium (POTASSIMIN PO) Take 1 tablet by mouth daily.   Yes [provider]  sevelamer (RENVELA) 800 MG tablet Take 1,600-3,200 mg by mouth See admin instructions. Take 3200  mg by mouth 3 times daily with full meals and take 1600 mg by mouth with snacks.   Yes [provider]  traMADol (ULTRAM) 50 MG tablet Take 50 mg by mouth 3 (three) times daily as needed for moderate pain.   Yes [provider]  nitroGLYCERIN (NITRODUR - DOSED IN MG/24 HR) 0.2 mg/hr patch Place 1 patch (0.2 mg total) onto the skin daily. Apply 1 patch to dorsum of foot daily. Patient not taking: Reported on 06/12/2018 05/05/18   Rayburn, Neta Mends, PA-C  ondansetron (ZOFRAN) 8 MG tablet Take 1 tablet (8 mg total) by mouth every 8 (eight) hours as needed for nausea or vomiting. Patient not taking: Reported on 06/09/2018 03/24/17   Daleen Bo, MD  pentoxifylline (TRENTAL) 400 MG CR tablet Take 1 tablet (400 mg total) by mouth 3 (three) times daily with meals. Patient not taking: Reported on 06/09/2018 05/05/18   Rayburn, Neta Mends, PA-C   Liver Function Tests Recent Labs  Lab 06/12/18 1141 06/13/18 0109 06/13/18 0426  AST 20 18  --   ALT <5 7  --   ALKPHOS 79 70  --   BILITOT 0.6 0.8  --   PROT 8.1 7.2  --   ALBUMIN 2.8* 2.2* 2.1*   No results for input(s): LIPASE, AMYLASE in the last 168 hours. CBC Recent Labs  Lab 06/12/18 1141 06/12/18 1845 06/13/18 1245  WBC 23.8* 22.8* 19.0*  NEUTROABS 20.5*  --  16.2*  HGB 13.2 13.0 11.8*  HCT 42.1 42.2 37.6*  MCV 64.1* 63.3* 63.1*  PLT 382 366 086   Basic Metabolic Panel Recent Labs  Lab 06/09/18 0650 06/09/18 1232 06/12/18 1141 06/12/18 1845 06/13/18 0109 06/13/18 0426  NA 136  --  136  --  136 137  K 3.6  --  3.3*  --  3.7 3.8  CL  --   --  96*  --  100  101  CO2  --   --  28  --  25 23  GLUCOSE 90  --  84  --  105* 87  BUN  --   --  19  --  25* 25*  CREATININE  --  8.08* 5.07* 5.70* 6.20* 6.47*  CALCIUM  --   --  9.0  --  9.4 9.4  PHOS  --   --   --   --   --  3.8   Iron/TIBC/Ferritin/ %Sat No results found for: IRON, TIBC, FERRITIN, IRONPCTSAT  Vitals:   06/13/18 1051 06/13/18 1108 06/13/18 1230 06/13/18 1341  BP: (!) 118/50 (!) 120/54 (!) 85/62 (!) 87/48  Pulse: 98 98 88 85  Resp: 18 18 17  (!) 22  Temp: 97.7 F (36.5 C) 97.6 F (36.4 C)    TempSrc:  Oral    SpO2: 100% 98% 100%   Weight:      Height:       Exam Gen alert, no distress, post-op lying in bed, no distress  No jvd or bruits Chest clear bilat no rales or wheezing RRR no MRG Abd soft ntnd no mass or ascites +bs Ext no sig LE edema, 2+ RUE edema, R hand bandaged sp 2nd finger amputation Neuro is alert, Ox 3 , nf   Home meds:  - atorvastatin 40/ nitroglycerin patch 0.2mg  qd/ pentoxifylline  - cinacalcet 60 hs/ sevelamer 2-3 ac tid/ potassium po qd  - ibuprofen 600 qid prn/ oxycodone-acetaminophen 5-325 qid prn/ tramadol 50mg  tid prn/ prn ondansetron 8mg  tid  - vitmains  Dialysis:   4h  77kg   Heparin 1000 then 2000/hr   R IJ TDC / recent ligating R AVG   CXR 2/11 no edema  Assessment: 1. 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 Hypotension - BP's here in 90's, asymptomatic, not on any BP lowering meds at home or here.  3. ESRD on HD MWF. Had HD at OP unit Friday.  +2kg up by wts.  4. HTN 5. HL 6. MBD ckd 7. Anemia of ckd - Hb > 10 8. Dispo - stable for dc from renal standpoint. If here tomorrow will plan HD.     P: 1. Wean off nasal O2.  2. Get CXR if hypoxemic 3. Will follow.      [facility] 06/13/2018, 3:16 PM

## 2018-06-13 NOTE — Progress Notes (Addendum)
RN verified the presence of a signed informed consent that matches stated procedure by patient. Verified armband matches patient's stated name and birth date. Consent signed in Short Stay. Verified NPO status and that all jewelry, contact, glasses, dentures, and partials had been removed (if applicable).

## 2018-06-13 NOTE — Significant Event (Addendum)
Rapid Response Event Note  Overview: Soft BP and Irregular HR  Initial Focused Assessment: Called by RN about patient having an irregular HR and soft blood pressures. Per RN, patient is alert and oriented, warm and dry, having ectopy in small bursts. EKG was done. TRH NP was also notified. Hard to obtain accurate BPs (old AV site one extremity) and new site in other extremity, RLE (foot is partially amputated) as well. Patient is in no distress.   Interventions: -- EKG - AF rate controlled. -- 250 NS bolus x 1 -- Labs ordered (repeat LA, BMP, MAG)  Plan of Care: -- Follow up labs and vital signs as needed  Event Summary:    at    Call Time: [place] Time: 0015 End Time: Harvey, Delice Lesch

## 2018-06-13 NOTE — Op Note (Signed)
NAME: Joseph SR., Joseph L. MEDICAL RECORD [id number] ACCOUNT [id number] DATE OF BIRTH:18-Oct-1940 FACILITY: [facility] LOCATION: [facility] PHYSICIAN:Atticus Lemberger C. 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  PROCEDURE:  Amputation of the right long finger.  ANESTHESIA:  Axillary block.  INDICATIONS:  The patient is a 78 year old gentleman, a known renal vascular patient with history of dry gangrene.  Over the course of last couple days, he has developed wet gangrene and sepsis, source thought to be his right long finger.  On evaluation,  he had wet gangrene of the right long finger.  Risks, benefits and alternatives of amputation were discussed with the patient.  He agreed with this course of action.  Consent was obtained.  DESCRIPTION OF PROCEDURE:  The patient was taken to the operating room after anesthesia performed an axillary block.  The right upper extremity was prepped and draped in normal sterile fashion.  At the base of the finger, an incision in the skin down to  the bone was made.  A fish-mouth type skin flap was created.  The bone was transected just at the very proximal proximal phalanx.  A full-thickness skin, nonviable tissue, tendon, and bone were debrided.  There was a superficial skin slough on the dorsal  aspect of the metacarpophalangeal joint.  This skin was debrided.  Probing of this into that extensor tendon sheath area did not reveal any purulence.  There was some purulence in the flexor tendon sheath.  This was probed, squeezed, then irrigated with  IrriSept irrigation solution.  Hemostasis was then obtained with cautery.  The skin flap was closed with two 2-0 nylon sutures.  Xeroform was packed into the wound, and a sterile dressing was applied.  The patient tolerated the procedure well.  LN/NUANCE  D:06/13/2018 T:06/13/2018  JOB:[id number]

## 2018-06-13 NOTE — Progress Notes (Signed)
Patient ID: Joseph Hartshorn., male   DOB: 10-15-1940, 78 y.o.   MRN: [id number]  PROGRESS NOTE    [name]  WUJ:[id number] DOB: [birth year] DOA: 06/12/2018 PCP: [name], MD   Brief Narrative: 78 year old male with history of hypertension, peripheral vascular disease, nonobstructive coronary artery disease, nonischemic cardiomyopathy, end-stage renal disease on dialysis, recent steal syndrome and recently discharged from [facility] on 06/10/2018 after having a ligation of the right AV graft and placement of right subclavian TDC by Dr. Doren Custard on 06/09/2018 along with gangrene of his right second and third finger for which she was discharged on oral antibiotics presented with worsening right hand pain and was transferred from [facility] for the same.  Vascular surgery and hand surgery were consulted.  Patient was started on IV antibiotics.   Assessment & Plan:   Principal Problem:   Severe sepsis ([place]) Active Problems:   ESRD on dialysis ([place])   Non-ischemic cardiomyopathy- EF 35- 45%   CAD- 40-50% LAD at cath 07/11/15   Aspirin intolerance   Moderate aortic stenosis   PVD (peripheral vascular disease) ([facility])   S/P transmetatarsal amputation of foot, right ([facility]CC)   Gangrene of finger ([facility])   Leukocytosis   MRSA (methicillin resistant staph aureus) culture positive   Essential hypertension   Cellulitis and gangrene of the fingers of the right hand -Currently on broad-spectrum antibiotics.  Hand surgery has been consulted and probable plan for surgery today.  Leukocytosis -from above.  Continue antibiotics.  Monitor  Severe sepsis -From above.  Hemodynamically stable.  Follow cultures  Recent steal syndrome status post ligation of right AV graft and placement of right subclavian TDC by vascular surgery -Vascular surgery also consulted.  Follow recommendations  End-stage renal disease on hemodialysis -Had dialysis yesterday.  Consulted nephrology to continue  dialysis.  Hypotension in a patient with essential hypertension  -Blood pressure stable currently after gentle fluid boluses.  Antihypertensives on hold.  Monitor   Nonischemic cardiomyopathy/nonobstructive coronary artery disease -Apparently patient had normalization of LVEF as of echocardiogram in February 2017 -Currently no chest pain.  Strict input and output.  Daily weights.  Volume managed by dialysis.   DVT prophylaxis: Heparin Code Status: Full Family Communication: None at bedside Disposition Plan: Depends on clinical outcome  Consultants: Vascular surgery/hand surgery/nephrology  Procedures: None  Antimicrobials: Vancomycin and Zosyn from 06/12/2018 onwards   Subjective: Patient seen and examined at bedside.  He is a poor historian but is awake and denies worsening hand pain.  No overnight fever or vomiting.  Objective: Vitals:   06/13/18 0019 06/13/18 0124 06/13/18 0346 06/13/18 0742  BP: 99/80 (!) 78/43 (!) 103/42 (!) 111/43  Pulse: (!) 58  93   Resp: 19 19 17  (!) 21  Temp: 98.1 F (36.7 C)  98.3 F (36.8 C)   TempSrc: Oral  Oral   SpO2:   96%   Weight:      Height:        Intake/Output Summary (Last 24 hours) at 06/13/2018 0948 Last data filed at 06/12/2018 1502 Gross per 24 hour  Intake 2700 ml  Output -  Net 2700 ml   Filed Weights   06/12/18 1122  Weight: 77.1 kg    Examination:  General exam: Appears calm and comfortable.  Awake but a poor historian Respiratory system: Bilateral decreased breath sounds at bases Cardiovascular system: S1 & S2 heard, Rate controlled Gastrointestinal system: Abdomen is nondistended, soft and nontender. Normal bowel sounds  heard. Extremities: No cyanosis, clubbing, edema.  Right hand dressing present  Data Reviewed: I have personally reviewed following labs and imaging studies  CBC: Recent Labs  Lab 06/09/18 0650 06/09/18 1232 06/12/18 1141 06/12/18 1845  WBC  --  26.4* 23.8* 22.8*  NEUTROABS  --   --   20.5*  --   HGB 15.3 14.3 13.2 13.0  HCT 45.0 44.3 42.1 42.2  MCV  --  63.6* 64.1* 63.3*  PLT  --  PLATELET CLUMPS NOTED ON SMEAR, COUNT APPEARS ADEQUATE 382 469   Basic Metabolic Panel: Recent Labs  Lab 06/09/18 0650 06/09/18 1232 06/12/18 1141 06/12/18 1845 06/13/18 0109 06/13/18 0426  NA 136  --  136  --  136 137  K 3.6  --  3.3*  --  3.7 3.8  CL  --   --  96*  --  100 101  CO2  --   --  28  --  25 23  GLUCOSE 90  --  84  --  105* 87  BUN  --   --  19  --  25* 25*  CREATININE  --  8.08* 5.07* 5.70* 6.20* 6.47*  CALCIUM  --   --  9.0  --  9.4 9.4  MG  --   --   --   --  1.9 1.8  PHOS  --   --   --   --   --  3.8   GFR: Estimated Creatinine Clearance: 9.4 mL/min (A) (by C-G formula based on SCr of 6.47 mg/dL (H)). Liver Function Tests: Recent Labs  Lab 06/12/18 1141 06/13/18 0109 06/13/18 0426  AST 20 18  --   ALT <5 7  --   ALKPHOS 79 70  --   BILITOT 0.6 0.8  --   PROT 8.1 7.2  --   ALBUMIN 2.8* 2.2* 2.1*   No results for input(s): LIPASE, AMYLASE in the last 168 hours. No results for input(s): AMMONIA in the last 168 hours. Coagulation Profile: No results for input(s): INR, PROTIME in the last 168 hours. Cardiac Enzymes: No results for input(s): CKTOTAL, CKMB, CKMBINDEX, TROPONINI in the last 168 hours. BNP (last 3 results) No results for input(s): PROBNP in the last 8760 hours. HbA1C: No results for input(s): HGBA1C in the last 72 hours. CBG: No results for input(s): GLUCAP in the last 168 hours. Lipid Profile: No results for input(s): CHOL, HDL, LDLCALC, TRIG, CHOLHDL, LDLDIRECT in the last 72 hours. Thyroid Function Tests: No results for input(s): TSH, T4TOTAL, FREET4, T3FREE, THYROIDAB in the last 72 hours. Anemia Panel: No results for input(s): VITAMINB12, FOLATE, FERRITIN, TIBC, IRON, RETICCTPCT in the last 72 hours. Sepsis Labs: Recent Labs  Lab 06/12/18 1142 06/12/18 1438 06/13/18 0109  LATICACIDVEN 3.3* 2.7* 1.6    Recent Results (from  the past 240 hour(s))  MRSA PCR Screening     Status: Abnormal   Collection Time: 06/09/18  2:49 PM  Result Value Ref Range Status   MRSA by PCR POSITIVE (A) NEGATIVE Final    Comment:        The GeneXpert MRSA Assay (FDA approved for NASAL specimens only), is one component of a comprehensive MRSA colonization surveillance program. It is not intended to diagnose MRSA infection nor to guide or monitor treatment for MRSA infections. RESULT CALLED TO, READ BACK BY AND VERIFIED WITH: Mohammed Kindle RN 17:30 06/09/18 ([email or web address]) Performed at [facility], [place] [address], [place], [place] [address]   Blood  Culture (routine x 2)     Status: None (Preliminary result)   Collection Time: 06/12/18 11:41 AM  Result Value Ref Range Status   Specimen Description BLOOD LEFT ARM DRAWN BY RN  Final   Special Requests   Final    BOTTLES DRAWN AEROBIC AND ANAEROBIC Blood Culture adequate volume   Culture   Final    NO GROWTH < 24 HOURS Performed at [facility], [address] [place], [place] [address]    Report Status PENDING  Incomplete  Blood Culture (routine x 2)     Status: None (Preliminary result)   Collection Time: 06/12/18 11:46 AM  Result Value Ref Range Status   Specimen Description LEFT ANTECUBITAL DRAWN BY RN  Final   Special Requests   Final    BOTTLES DRAWN AEROBIC AND ANAEROBIC Blood Culture adequate volume   Culture   Final    NO GROWTH < 24 HOURS Performed at [facility], [address], [place], [place] [address]    Report Status PENDING  Incomplete         Radiology Studies: Dg Hand 2 View Right  Result Date: 06/12/2018 CLINICAL DATA:  Acute right hand pain and swelling. EXAM: RIGHT HAND - 2 VIEW COMPARISON:  None. FINDINGS: There is no evidence of fracture or dislocation. There is no evidence of arthropathy or other focal bone abnormality. Vascular calcifications are noted. Dorsal soft tissue swelling is noted which contains gas consistent with cellulitis. Severe  enlargement of the third and fourth fingers is noted consistent with infection. Gas is present and gangrene can not be excluded. IMPRESSION: Dorsal soft tissue swelling is noted which contains gas consistent with cellulitis. Severe soft tissue enlargement of the third and fourth fingers is noted as well which contains gas concerning for cellulitis or possibly gangrene. No definite lytic destruction is seen to suggest osteomyelitis. Electronically Signed   By: Marijo Conception, M.D.   On: 06/12/2018 13:56        Scheduled Meds: . [MAR Hold] heparin  5,000 Units Subcutaneous Q8H   Continuous Infusions: . sodium chloride 50 mL/hr at 06/12/18 1841  . [MAR Hold] piperacillin-tazobactam (ZOSYN)  IV 3.375 g (06/13/18 0208)  . [MAR Hold] vancomycin       LOS: 1 day        Aline August, MD [facility] Pager [phone]  If 7PM-7AM, please contact night-coverage [email or web address] Password [facility] 06/13/2018, 9:48 AM

## 2018-06-13 NOTE — Progress Notes (Addendum)
RN paged NP because pt had a soft BP and frequent PVCs on tele. One pause on tele. NP to bedside.  Pt looks good. Alert, talking, joking. EKG shows AFib in one teens. Do not see a hx AF on chart.  He is not on rate control meds. Do not want to tx AF at this point due to soft BP and pause. Will follow.  RN to page me next BP.  250cc bolus for soft BP.  LA elevated earlier today. Recheck. Check BMP and Mg.  KJKG, NP [facility] Update: LA normal. K normal. Mg normal. BP up to 103 after small bolus. HR in the 90s now.  KJKG, NP [facility]

## 2018-06-13 NOTE — Anesthesia Preprocedure Evaluation (Addendum)
Anesthesia Evaluation  Patient identified by MRN, date of birth, ID band Patient awake    Reviewed: Allergy & Precautions, H&P , NPO status , Patient's Chart, lab work & pertinent test results  Airway Mallampati: II  TM Distance: >3 FB Neck ROM: Full    Dental no notable dental hx. (+) Edentulous Upper, Edentulous Lower, Dental Advisory Given   Pulmonary neg pulmonary ROS, former smoker,    Pulmonary exam normal breath sounds clear to auscultation       Cardiovascular hypertension, + Peripheral Vascular Disease  + Valvular Problems/Murmurs AS  Rhythm:Regular Rate:Normal     Neuro/Psych Anxiety negative neurological ROS     GI/Hepatic Neg liver ROS, PUD,   Endo/Other  negative endocrine ROS  Renal/GU ESRF and DialysisRenal disease  negative genitourinary   Musculoskeletal  (+) Arthritis , Osteoarthritis,    Abdominal   Peds  Hematology  (+) Blood dyscrasia, anemia ,   Anesthesia Other Findings   Reproductive/Obstetrics negative OB ROS                             Anesthesia Physical Anesthesia Plan  ASA: IV  Anesthesia Plan: MAC and Regional   Post-op Pain Management:    Induction: Intravenous  PONV Risk Score and Plan: 1 and Ondansetron  Airway Management Planned: Simple Face Mask  Additional Equipment:   Intra-op Plan:   Post-operative Plan:   Informed Consent: I have reviewed the patients History and Physical, chart, labs and discussed the procedure including the risks, benefits and alternatives for the proposed anesthesia with the patient or authorized representative who has indicated his/her understanding and acceptance.     Dental advisory given  Plan Discussed with: CRNA  Anesthesia Plan Comments: (Pt with sepsis secondary to gangrene of the hand. H/o moderate AS in 2017 with no further studies that I am aware of. Plan is to get the patient through surgery with a  peripheral nerve block and not much sedation. Pt aware he will be aware in the OR but that he is too sick to put to sleep.)       Anesthesia Quick Evaluation

## 2018-06-13 NOTE — Consult Note (Signed)
Reason for Consult:infected finger Referring Physician: ER  CC:My finger is infected  HPI:  Joseph LEDO Sr. is an 78 y.o. right handed male who presents with drainage of long finger.    Pt had av fistula ligation recently, had known gangrene changes per notes of finger, send home with f/u with Dr Sharol Given.  Represented 2d after discharge with wet gangrene of finger(s) sepsis.        Pain is rated at    5/10 and is described as dull.  Pain is constant.  Pain is made better by rest/immobilization, worse with motion.   Associated signs/symptoms:multiple medical problems Previous treatment:  Av fistula ligation recently.  Past Medical History:  Diagnosis Date  . Anxiety   . Aortic stenosis   . Arthritis   . CVA (cerebral infarction)   . ESRD (end stage renal disease) on dialysis [facility])    M/W/F at [facility] in [place]  . Essential hypertension    resolved with HD  . Gangrene of right foot ([place])   . Gastric ulcer 2004  . History of cardiomyopathy    LVEF normal as of February 2017  . History of stroke    Left side weakness  . Iron deficiency anemia     Past Surgical History:  Procedure Laterality Date  . ABDOMINAL AORTAGRAM N/A 01/24/2012   Procedure: ABDOMINAL Maxcine Ham;  Surgeon: Elam Dutch, MD;  Location: [facility] CATH LAB;  Service: Cardiovascular;  Laterality: N/A;  . ABDOMINAL AORTOGRAM W/LOWER EXTREMITY N/A 04/15/2017   Procedure: ABDOMINAL AORTOGRAM W/LOWER EXTREMITY;  Surgeon: Angelia Mould, MD;  Location: [place] CV LAB;  Service: Cardiovascular;  Laterality: N/A;  . AMPUTATION Right 05/09/2017   Procedure: RIGHT TRANSMETATARSAL AMPUTATION;  Surgeon: [name], MD;  Location: [place];  Service: Orthopedics;  Laterality: Right;  . ARTERIOVENOUS GRAFT PLACEMENT Right right arm  . AV FISTULA PLACEMENT Left 08/31/2015   Procedure: ARTERIOVENOUS (AV) FISTULA CREATION- LEFT RADIOCEPHALIC;  Surgeon: Mal Misty, MD;  Location: [place];  Service: Vascular;  Laterality:  Left;  . AV FISTULA PLACEMENT Right 02/18/2017   Procedure: INSERTION OF ARTERIOVENOUS (AV) GORE-TEX GRAFT  RIGHT UPPER ARM;  Surgeon: Conrad Camp Hill, MD;  Location: [place];  Service: Vascular;  Laterality: Right;  . BASCILIC VEIN TRANSPOSITION Left 12/19/2015   Procedure: FIRST STAGE BRACHIAL VEIN TRANSPOSITION;  Surgeon: Conrad North Adams, MD;  Location: [place];  Service: Vascular;  Laterality: Left;  . BASCILIC VEIN TRANSPOSITION Left 02/08/2016   Procedure: SECOND STAGE BRACHIAL VEIN TRANSPOSITION;  Surgeon: Conrad Clarksville, MD;  Location: [place];  Service: Vascular;  Laterality: Left;  . CARDIAC CATHETERIZATION N/A 07/11/2015   Procedure: Left Heart Cath and Coronary Angiography;  Surgeon: Troy Sine, MD;  Location: [place] CV LAB;  Service: Cardiovascular;  Laterality: N/A;  . Carpel Tunnel Left Dec. 22, 2016  . CHOLECYSTECTOMY    . COLONOSCOPY  2004   Dr. Irving Shows, left sided diverticula and cecal polyp, path unknown  . COLONOSCOPY  10/29/2011   Procedure: COLONOSCOPY;  Surgeon: Daneil Dolin, MD;  [facility];  Service: Endoscopy;  Laterality: N/A;  10:15  . ESOPHAGOGASTRODUODENOSCOPY  11/2002   Dr. Gala Romney, erosive reflux esophagitis, multiple gastric ulcer and antral/bulbar erosions. Serologies positive for H.Pylori and was treated  . ESOPHAGOGASTRODUODENOSCOPY  11/20014   Dr. Gala Romney, small hh only, ulcers healed  . ESOPHAGOGASTRODUODENOSCOPY  09/21/2011   Dr Trevor Iha [facility], antral erosions, ?early GAVE  . FISTULOGRAM Left 12/10/2016  Procedure: THROMBECTOMY OF LEFT ARM ARTERIOVENOUS FISTULA;  Surgeon: Waynetta Sandy, MD;  Location: [place];  Service: Vascular;  Laterality: Left;  . INSERTION OF DIALYSIS CATHETER Left 12/10/2016   Procedure: INSERTION OF TUNNELED DIALYSIS CATHETER;  Surgeon: Waynetta Sandy, MD;  Location: [place];  Service: Vascular;  Laterality: Left;  . INSERTION OF DIALYSIS CATHETER Right 06/09/2018   Procedure: INSERTION OF DIALYSIS CATHETER  Right subclavian;  Surgeon: Angelia Mould, MD;  Location: [place];  Service: Vascular;  Laterality: Right;  . IR DIALY SHUNT INTRO [place] W/IMG RIGHT Right 01/01/2018  . IR GENERIC HISTORICAL  07/16/2016   IR REMOVAL TUN CV CATH W/O FL 07/16/2016 Saverio Danker, PA-C [facility]-INTERV RAD  . IR PTA ADDL CENTRAL DIALYSIS SEG THRU DIALY CIRCUIT RIGHT Right 10/21/2017  . IR REMOVAL TUN CV CATH W/O FL  05/12/2017  . IR THROMBECTOMY AV FISTULA W/THROMBOLYSIS/PTA INC/SHUNT/IMG RIGHT Right 10/21/2017  . IR THROMBECTOMY AV FISTULA W/THROMBOLYSIS/PTA INC/SHUNT/IMG RIGHT Right 11/27/2017  . IR US GUIDE VASC ACCESS RIGHT  10/21/2017  . IR US GUIDE VASC ACCESS RIGHT  11/27/2017  . IR US GUIDE VASC ACCESS RIGHT  01/01/2018  . LIGATION ARTERIOVENOUS GORTEX GRAFT Right 06/09/2018  . LIGATION ARTERIOVENOUS GORTEX GRAFT Right 06/09/2018   Procedure: LIGATION ARTERIOVENOUS GORTEX GRAFT RIGHT ARM;  Surgeon: Angelia Mould, MD;  Location: [place];  Service: Vascular;  Laterality: Right;  . LIGATION OF ARTERIOVENOUS  FISTULA Left 12/19/2015   Procedure: LIGATION OF RADIOCEPHALIC ARTERIOVENOUS  FISTULA;  Surgeon: Conrad Vail, MD;  Location: [place];  Service: Vascular;  Laterality: Left;  Marland Kitchen MASS EXCISION Right 02/18/2017   Procedure: EXCISION OF RIGHT AXILLARY EPIDERMAL INCLUSION CYST;  Surgeon: Conrad Larned, MD;  Location: [place];  Service: Vascular;  Laterality: Right;  . PERIPHERAL VASCULAR CATHETERIZATION N/A 12/14/2015   Procedure: Fistulagram;  Surgeon: Conrad Alameda, MD;  Location: [place] CV LAB;  Service: Cardiovascular;  Laterality: N/A;  . SHOULDER SURGERY    . ULTRASOUND GUIDANCE FOR VASCULAR ACCESS  04/15/2017   Procedure: Ultrasound Guidance For Vascular Access;  Surgeon: Angelia Mould, MD;  Location: [place] CV LAB;  Service: Cardiovascular;;  . UPPER EXTREMITY VENOGRAPHY Bilateral 12/17/2016   Procedure: Bilateral Upper Extremity Venography;  Surgeon: Serafina Mitchell, MD;   Location: [place] CV LAB;  Service: Cardiovascular;  Laterality: Bilateral;    Family History  Problem Relation Age of Onset  . Hypertension Mother   . Colon cancer Neg Hx   . Liver disease Neg Hx     Social History:  reports that he quit smoking about 14 years ago. He quit smokeless tobacco use about 31 years ago.  His smokeless tobacco use included chew. He reports that he does not drink alcohol or use drugs.  Allergies:  Allergies  Allergen Reactions  . Aspirin Other (See Comments)    INTERNAL BLEEDING Causes internal bleeding  Other Reaction: history of ulcers    Medications: I have reviewed the patient's current medications.  Results for orders placed or performed during the hospital encounter of 06/12/18 (from the past 48 hour(s))  Comprehensive metabolic panel     Status: Abnormal   Collection Time: 06/12/18 11:41 AM  Result Value Ref Range   Sodium 136 135 - 145 mmol/L   Potassium 3.3 (L) 3.5 - 5.1 mmol/L   Chloride 96 (L) 98 - 111 mmol/L   CO2 28 22 - 32 mmol/L   Glucose, Bld 84 70 - 99 mg/dL  BUN 19 8 - 23 mg/dL   Creatinine, Ser 5.07 (H) 0.61 - 1.24 mg/dL   Calcium 9.0 8.9 - 10.3 mg/dL   Total Protein 8.1 6.5 - 8.1 g/dL   Albumin 2.8 (L) 3.5 - 5.0 g/dL   AST 20 15 - 41 U/L   ALT <5 0 - 44 U/L   Alkaline Phosphatase 79 38 - 126 U/L   Total Bilirubin 0.6 0.3 - 1.2 mg/dL   GFR calc non Af Amer 10 (L) >60 mL/min   GFR calc Af Amer 12 (L) >60 mL/min   Anion gap 12 5 - 15    Comment: Performed at [facility], [address] [place], [place] [address]  CBC WITH DIFFERENTIAL     Status: Abnormal   Collection Time: 06/12/18 11:41 AM  Result Value Ref Range   WBC 23.8 (H) 4.0 - 10.5 K/uL   RBC 6.57 (H) 4.22 - 5.81 MIL/uL   Hemoglobin 13.2 13.0 - 17.0 g/dL   HCT 42.1 39.0 - 52.0 %   MCV 64.1 (L) 80.0 - 100.0 fL   MCH 20.1 (L) 26.0 - 34.0 pg   MCHC 31.4 30.0 - 36.0 g/dL   RDW 19.1 (H) 11.5 - 15.5 %   Platelets 382 150 - 400 K/uL    Comment: FEW PLATELET  CLUMPS SEEN   nRBC 0.0 0.0 - 0.2 %   Neutrophils Relative % 87 %   Neutro Abs 20.5 (H) 1.7 - 7.7 K/uL   Lymphocytes Relative 5 %   Lymphs Abs 1.3 0.7 - 4.0 K/uL   Monocytes Relative 7 %   Monocytes Absolute 1.7 (H) 0.1 - 1.0 K/uL   Eosinophils Relative 0 %   Eosinophils Absolute 0.1 0.0 - 0.5 K/uL   Basophils Relative 0 %   Basophils Absolute 0.1 0.0 - 0.1 K/uL   RBC Morphology SLIGHT MICROCYTOSIS    Immature Granulocytes 1 %   Abs Immature Granulocytes 0.16 (H) 0.00 - 0.07 K/uL    Comment: Performed at [facility], [address], [place], [place] [address]  Blood Culture (routine x 2)     Status: None (Preliminary result)   Collection Time: 06/12/18 11:41 AM  Result Value Ref Range   Specimen Description BLOOD LEFT ARM DRAWN BY RN    Special Requests      BOTTLES DRAWN AEROBIC AND ANAEROBIC Blood Culture adequate volume   Culture      NO GROWTH < 24 HOURS Performed at [facility], [address] [place], [place] [address]    Report Status PENDING   Lactic acid, plasma     Status: Abnormal   Collection Time: 06/12/18 11:42 AM  Result Value Ref Range   Lactic Acid, Venous 3.3 (HH) 0.5 - 1.9 mmol/L    Comment: CRITICAL RESULT CALLED TO, READ BACK BY AND VERIFIED WITH: KEMP,C. AT 1314 ON 06/12/2018 BY EVA Performed at [facility], [address], [place], [place] [address]   Blood Culture (routine x 2)     Status: None (Preliminary result)   Collection Time: 06/12/18 11:46 AM  Result Value Ref Range   Specimen Description LEFT ANTECUBITAL DRAWN BY RN    Special Requests      BOTTLES DRAWN AEROBIC AND ANAEROBIC Blood Culture adequate volume   Culture      NO GROWTH < 24 HOURS Performed at [facility], [address], [place], [place] [address]    Report Status PENDING   Lactic acid, plasma     Status: Abnormal  Collection Time: 06/12/18  2:38 PM  Result Value Ref Range   Lactic Acid, Venous 2.7 (HH) 0.5 - 1.9 mmol/L    Comment: CRITICAL RESULT CALLED TO, READ BACK BY  AND VERIFIED WITH: MARTIN,D ON 06/12/18 AT 1535 BY LOY,C Performed at [facility], [address], [place], [place] [address]   CBC     Status: Abnormal   Collection Time: 06/12/18  6:45 PM  Result Value Ref Range   WBC 22.8 (H) 4.0 - 10.5 K/uL   RBC 6.67 (H) 4.22 - 5.81 MIL/uL   Hemoglobin 13.0 13.0 - 17.0 g/dL   HCT 42.2 39.0 - 52.0 %   MCV 63.3 (L) 80.0 - 100.0 fL   MCH 19.5 (L) 26.0 - 34.0 pg   MCHC 30.8 30.0 - 36.0 g/dL   RDW 18.5 (H) 11.5 - 15.5 %   Platelets 366 150 - 400 K/uL    Comment: REPEATED TO VERIFY   nRBC 0.0 0.0 - 0.2 %    Comment: Performed at [facility], [place] [address], [place], [place] [address]  Creatinine, serum     Status: Abnormal   Collection Time: 06/12/18  6:45 PM  Result Value Ref Range   Creatinine, Ser 5.70 (H) 0.61 - 1.24 mg/dL   GFR calc non Af Amer 9 (L) >60 mL/min   GFR calc Af Amer 10 (L) >60 mL/min    Comment: Performed at [facility] [address] [place], [place] [address]  Lactic acid, plasma     Status: None   Collection Time: 06/13/18  1:09 AM  Result Value Ref Range   Lactic Acid, Venous 1.6 0.5 - 1.9 mmol/L    Comment: Performed at [facility] [address], [place], [place] [address]  Comprehensive metabolic panel     Status: Abnormal   Collection Time: 06/13/18  1:09 AM  Result Value Ref Range   Sodium 136 135 - 145 mmol/L   Potassium 3.7 3.5 - 5.1 mmol/L   Chloride 100 98 - 111 mmol/L   CO2 25 22 - 32 mmol/L   Glucose, Bld 105 (H) 70 - 99 mg/dL   BUN 25 (H) 8 - 23 mg/dL   Creatinine, Ser 6.20 (H) 0.61 - 1.24 mg/dL   Calcium 9.4 8.9 - 10.3 mg/dL   Total Protein 7.2 6.5 - 8.1 g/dL   Albumin 2.2 (L) 3.5 - 5.0 g/dL   AST 18 15 - 41 U/L   ALT 7 0 - 44 U/L   Alkaline Phosphatase 70 38 - 126 U/L   Total Bilirubin 0.8 0.3 - 1.2 mg/dL   GFR calc non Af Amer 8 (L) >60 mL/min   GFR calc Af Amer 9 (L) >60 mL/min   Anion gap 11 5 - 15    Comment: Performed at [facility], [place] [address],  [place], [place] [address]  Magnesium     Status: None   Collection Time: 06/13/18  1:09 AM  Result Value Ref Range   Magnesium 1.9 1.7 - 2.4 mg/dL    Comment: Performed at [facility] [address], [place], [place] [address]  Renal function panel     Status: Abnormal   Collection Time: 06/13/18  4:26 AM  Result Value Ref Range   Sodium 137 135 - 145 mmol/L   Potassium 3.8 3.5 - 5.1 mmol/L   Chloride 101 98 - 111 mmol/L   CO2 23 22 - 32 mmol/L  Glucose, Bld 87 70 - 99 mg/dL   BUN 25 (H) 8 - 23 mg/dL   Creatinine, Ser 6.47 (H) 0.61 - 1.24 mg/dL   Calcium 9.4 8.9 - 10.3 mg/dL   Phosphorus 3.8 2.5 - 4.6 mg/dL   Albumin 2.1 (L) 3.5 - 5.0 g/dL   GFR calc non Af Amer 8 (L) >60 mL/min   GFR calc Af Amer 9 (L) >60 mL/min   Anion gap 13 5 - 15    Comment: Performed at [facility] [address] [place], [place] [address]  Magnesium     Status: None   Collection Time: 06/13/18  4:26 AM  Result Value Ref Range   Magnesium 1.8 1.7 - 2.4 mg/dL    Comment: Performed at [facility] [address], [place],  [address]    Dg Hand 2 View Right  Result Date: 06/12/2018 CLINICAL DATA:  Acute right hand pain and swelling. EXAM: RIGHT HAND - 2 VIEW COMPARISON:  None. FINDINGS: There is no evidence of fracture or dislocation. There is no evidence of arthropathy or other focal bone abnormality. Vascular calcifications are noted. Dorsal soft tissue swelling is noted which contains gas consistent with cellulitis. Severe enlargement of the third and fourth fingers is noted consistent with infection. Gas is present and gangrene can not be excluded. IMPRESSION: Dorsal soft tissue swelling is noted which contains gas consistent with cellulitis. Severe soft tissue enlargement of the third and fourth fingers is noted as well which contains gas concerning for cellulitis or possibly gangrene. No definite lytic destruction is seen to suggest osteomyelitis. Electronically Signed   By: Marijo Conception, M.D.   On: 06/12/2018 13:56    Pertinent items are noted in HPI. 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  The patient understands that additional treatment may be necessary.  [name] 06/13/2018, 9:30 AM

## 2018-06-13 NOTE — Anesthesia Procedure Notes (Signed)
Anesthesia Regional Block: Supraclavicular block   Pre-Anesthetic Checklist: ,, timeout performed, Correct Patient, Correct Site, Correct Laterality, Correct Procedure, Correct Position, site marked, Risks and benefits discussed, pre-op evaluation,  At surgeon's request and post-op pain management  Laterality: Right  Prep: Maximum Sterile Barrier Precautions used, chloraprep       Needles:  Injection technique: Single-shot  Needle Type: Echogenic Stimulator Needle     Needle Length: 5cm  Needle Gauge: 22     Additional Needles:   Procedures:,,,, ultrasound used (permanent image in chart),,,,  Narrative:  Start time: 06/13/2018 9:25 AM End time: 06/13/2018 9:35 AM Injection made incrementally with aspirations every 5 mL. Anesthesiologist: Roderic Palau, MD  Additional Notes: 2% Lidocaine skin wheel.

## 2018-06-14 ENCOUNTER — Encounter (HOSPITAL_COMMUNITY): Payer: Self-pay | Admitting: General Surgery

## 2018-06-14 LAB — RENAL FUNCTION PANEL
ANION GAP: 12 (ref 5–15)
Albumin: 1.9 g/dL — ABNORMAL LOW (ref 3.5–5.0)
BUN: 41 mg/dL — ABNORMAL HIGH (ref 8–23)
CO2: 21 mmol/L — ABNORMAL LOW (ref 22–32)
Calcium: 9.2 mg/dL (ref 8.9–10.3)
Chloride: 104 mmol/L (ref 98–111)
Creatinine, Ser: 8.66 mg/dL — ABNORMAL HIGH (ref 0.61–1.24)
GFR calc non Af Amer: 5 mL/min — ABNORMAL LOW (ref >60)
GFR, EST AFRICAN AMERICAN: 6 mL/min — AB (ref >60)
Glucose, Bld: 77 mg/dL (ref 70–99)
Phosphorus: 5.6 mg/dL — ABNORMAL HIGH (ref 2.5–4.6)
Potassium: 4.4 mmol/L (ref 3.5–5.1)
Sodium: 137 mmol/L (ref 135–145)

## 2018-06-14 LAB — CBC WITH DIFFERENTIAL/PLATELET
Abs Immature Granulocytes: 0.16 10*3/uL — ABNORMAL HIGH (ref 0.00–0.07)
Basophils Absolute: 0.1 10*3/uL (ref 0.0–0.1)
Basophils Relative: 1 %
Eosinophils Absolute: 0.5 10*3/uL (ref 0.0–0.5)
Eosinophils Relative: 3 %
HCT: 36.7 % — ABNORMAL LOW (ref 39.0–52.0)
Hemoglobin: 11.8 g/dL — ABNORMAL LOW (ref 13.0–17.0)
Immature Granulocytes: 1 %
Lymphocytes Relative: 11 %
Lymphs Abs: 2.1 10*3/uL (ref 0.7–4.0)
MCH: 20.5 pg — ABNORMAL LOW (ref 26.0–34.0)
MCHC: 32.2 g/dL (ref 30.0–36.0)
MCV: 63.7 fL — ABNORMAL LOW (ref 80.0–100.0)
Monocytes Absolute: 1.4 10*3/uL — ABNORMAL HIGH (ref 0.1–1.0)
Monocytes Relative: 7 %
Neutro Abs: 14.2 10*3/uL — ABNORMAL HIGH (ref 1.7–7.7)
Neutrophils Relative %: 77 %
Platelets: 343 10*3/uL (ref 150–400)
RBC: 5.76 MIL/uL (ref 4.22–5.81)
RDW: 18.6 % — ABNORMAL HIGH (ref 11.5–15.5)
WBC: 18.4 10*3/uL — ABNORMAL HIGH (ref 4.0–10.5)
nRBC: 0 % (ref 0.0–0.2)

## 2018-06-14 LAB — MAGNESIUM: Magnesium: 1.9 mg/dL (ref 1.7–2.4)

## 2018-06-14 MED ORDER — CHLORHEXIDINE GLUCONATE CLOTH 2 % EX PADS
6.0000 | MEDICATED_PAD | Freq: Every day | CUTANEOUS | Status: DC
  Administered 2018-06-15 – 2018-06-18 (×4): 6 via TOPICAL

## 2018-06-14 MED ORDER — VANCOMYCIN HCL IN DEXTROSE 750-5 MG/150ML-% IV SOLN
750.0000 mg | Freq: Once | INTRAVENOUS | Status: AC
  Administered 2018-06-14: 750 mg via INTRAVENOUS
  Filled 2018-06-14: qty 150

## 2018-06-14 MED ORDER — VANCOMYCIN HCL IN DEXTROSE 750-5 MG/150ML-% IV SOLN
750.0000 mg | INTRAVENOUS | Status: DC
  Administered 2018-06-15 – 2018-06-17 (×2): 750 mg via INTRAVENOUS
  Filled 2018-06-14 (×3): qty 150

## 2018-06-14 NOTE — Progress Notes (Signed)
Dressing changed per orders

## 2018-06-14 NOTE — Progress Notes (Signed)
Pharmacy Antibiotic Note  [name]. is a 78 y.o. male admitted on 06/12/2018 with gangrene/finger infection.  Pharmacy has been consulted for Zosyn and Vancomycin dosing.  Pt had emergent amputation of RLF on 2/15, but per surgery, may need further I&D. Recommended to continue broad spectrum antibiotics at this time. Patient did not receive a full loading dose of vancomycin, so will give vancomycin 750 mg IV x1 today to complete load. He can then continue on vanc with HD. WBC down to 18.4 and pt remains afebrile. Currently following MWF HD schedule.  Plan: Vancomycin 750 mg IV x 1 today Continue Vancomycin 750 mg IV MWF post-HD Continue Zosyn 3.375g IV every 12 hours F/u HD schedule, c/s, de-escalation, and LOT  Height: 5\' 5"  (165.1 cm) Weight: 174 lb 13.2 oz (79.3 kg) IBW/kg (Calculated) : 61.5  Temp (24hrs), Avg:98.6 F (37 C), Min:97.8 F (36.6 C), Max:99 F (37.2 C)  Recent Labs  Lab 06/09/18 1232 06/12/18 1141 06/12/18 1142 06/12/18 1438 06/12/18 1845 06/13/18 0109 06/13/18 0426 06/13/18 1245 06/13/18 1842 06/14/18 1032  WBC 26.4* 23.8*  --   --  22.8*  --   --  19.0*  --  18.4*  CREATININE 8.08* 5.07*  --   --  5.70* 6.20* 6.47*  --  7.50* 8.66*  LATICACIDVEN  --   --  3.3* 2.7*  --  1.6  --   --   --   --     Estimated Creatinine Clearance: 7.2 mL/min (A) (by C-G formula based on SCr of 8.66 mg/dL (H)).    Allergies  Allergen Reactions  . Aspirin Other (See Comments)    INTERNAL BLEEDING Causes internal bleeding  Other Reaction: history of ulcers    Antimicrobials this admission: Vanco 2/14 >> Zosyn 2/14 >>   Dose adjustments this admission: N/A  Microbiology results: 2/14 BCx: HGTD       Thank you for allowing pharmacy to be a part of this patient's care.  Jackson Latino, PharmD PGY1 Pharmacy Resident Phone [phone] 06/14/2018     2:09 PM

## 2018-06-14 NOTE — Progress Notes (Signed)
Patient ID: [name]., male   DOB: 1940-12-12, 78 y.o.   MRN: [id number]  PROGRESS NOTE    [name]  AOZ:[id number] DOB: 02/13/1941 DOA: 06/12/2018 PCP: [name], MD   Brief Narrative: 78 year old male with history of hypertension, peripheral vascular disease, nonobstructive coronary artery disease, nonischemic cardiomyopathy, end-stage renal disease on dialysis, recent steal syndrome and recently discharged from [facility] on 06/10/2018 after having a ligation of the right AV graft and placement of right subclavian TDC by Dr. Doren Custard on 06/09/2018 along with gangrene of his right second and third finger for which she was discharged on oral antibiotics presented with worsening right hand pain and was transferred from [facility] for the same.  Vascular surgery and hand surgery were consulted.  Patient was started on IV antibiotics.  Patient underwent amputation of right long finger.   Assessment & Plan:   Principal Problem:   Severe sepsis ([place]) Active Problems:   ESRD on dialysis ([place])   Non-ischemic cardiomyopathy- EF 35- 45%   CAD- 40-50% LAD at cath 07/11/15   Aspirin intolerance   Moderate aortic stenosis   PVD (peripheral vascular disease) (HCC)   S/P transmetatarsal amputation of foot, right (HCC)   Gangrene of finger (HCC)   Leukocytosis   MRSA (methicillin resistant staph aureus) culture positive   Essential hypertension   Cellulitis and gangrene of the finger of the right hand -Currently on broad-spectrum antibiotics.  -Status post amputation of right long finger on 06/13/2018 by hand surgery.  Will follow further recommendations.  We will follow-up with hand surgery regarding duration of antibiotics.  Leukocytosis -from above.  Continue antibiotics.  Monitor  Severe sepsis -From above.  Hemodynamically stable.  Cultures negative so far.  Recent steal syndrome status post ligation of right AV graft and placement of right subclavian TDC by  vascular surgery -Vascular surgery has signed off.  Outpatient follow-up with vascular surgery.  End-stage renal disease on hemodialysis -Nephrology following.  Dialysis as per nephrology schedule.  Hypotension in a patient with essential hypertension  -Blood pressure stable currently after gentle fluid boluses.  Antihypertensives on hold.  Monitor  Nonischemic cardiomyopathy/nonobstructive coronary artery disease -Apparently patient had normalization of LVEF as of echocardiogram in February 2017 -Currently no chest pain.  Strict input and output.  Daily weights.  Volume managed by dialysis.   DVT prophylaxis: Heparin Code Status: Full Family Communication: None at bedside Disposition Plan: Discharge home in the next 1 to 2 days once cleared by hand surgery.  Consultants: Vascular surgery/hand surgery/nephrology  Procedures: Amputation of right long finger on 06/13/2018  Antimicrobials: Vancomycin and Zosyn from 06/12/2018 onwards   Subjective: Patient seen and examined at bedside.  He is awake and denies any overnight fever or vomiting.  Complains of right hand pain. Objective: Vitals:   06/13/18 2106 06/14/18 0025 06/14/18 0622 06/14/18 1029  BP:   (!) 97/48 (!) 99/47  Pulse:   70 78  Resp:   16 18  Temp: 99 F (37.2 C) 98.9 F (37.2 C)  97.8 F (36.6 C)  TempSrc: Oral Oral  Oral  SpO2:   100% 100%  Weight:   79.3 kg   Height:        Intake/Output Summary (Last 24 hours) at 06/14/2018 1033 Last data filed at 06/13/2018 1617 Gross per 24 hour  Intake 946.67 ml  Output -  Net 946.67 ml   Filed Weights   06/12/18 1122 06/14/18 0622  Weight: 77.1 kg  79.3 kg    Examination:  General exam: Appears calm and comfortable.  Awake but a poor historian.  No distress Respiratory system: Bilateral decreased breath sounds at bases, no wheezing Cardiovascular system: Rate controlled, S1 & S2 heard Gastrointestinal system: Abdomen is nondistended, soft and nontender. Normal  bowel sounds heard. Extremities: No cyanosis,edema.  Right hand dressing present  Data Reviewed: I have personally reviewed following labs and imaging studies  CBC: Recent Labs  Lab 06/09/18 0650 06/09/18 1232 06/12/18 1141 06/12/18 1845 06/13/18 1245  WBC  --  26.4* 23.8* 22.8* 19.0*  NEUTROABS  --   --  20.5*  --  16.2*  HGB 15.3 14.3 13.2 13.0 11.8*  HCT 45.0 44.3 42.1 42.2 37.6*  MCV  --  63.6* 64.1* 63.3* 63.1*  PLT  --  PLATELET CLUMPS NOTED ON SMEAR, COUNT APPEARS ADEQUATE 382 366 301   Basic Metabolic Panel: Recent Labs  Lab 06/09/18 0650  06/12/18 1141 06/12/18 1845 06/13/18 0109 06/13/18 0426 06/13/18 1842  NA 136  --  136  --  136 137 132*  K 3.6  --  3.3*  --  3.7 3.8 3.9  CL  --   --  96*  --  100 101 100  CO2  --   --  28  --  25 23 21*  GLUCOSE 90  --  84  --  105* 87 94  BUN  --   --  19  --  25* 25* 33*  CREATININE  --    < > 5.07* 5.70* 6.20* 6.47* 7.50*  CALCIUM  --   --  9.0  --  9.4 9.4 8.7*  MG  --   --   --   --  1.9 1.8 1.8  PHOS  --   --   --   --   --  3.8  --    < > = values in this interval not displayed.   GFR: Estimated Creatinine Clearance: 8.3 mL/min (A) (by C-G formula based on SCr of 7.5 mg/dL (H)). Liver Function Tests: Recent Labs  Lab 06/12/18 1141 06/13/18 0109 06/13/18 0426  AST 20 18  --   ALT <5 7  --   ALKPHOS 79 70  --   BILITOT 0.6 0.8  --   PROT 8.1 7.2  --   ALBUMIN 2.8* 2.2* 2.1*   No results for input(s): LIPASE, AMYLASE in the last 168 hours. No results for input(s): AMMONIA in the last 168 hours. Coagulation Profile: No results for input(s): INR, PROTIME in the last 168 hours. Cardiac Enzymes: No results for input(s): CKTOTAL, CKMB, CKMBINDEX, TROPONINI in the last 168 hours. BNP (last 3 results) No results for input(s): PROBNP in the last 8760 hours. HbA1C: No results for input(s): HGBA1C in the last 72 hours. CBG: Recent Labs  Lab 06/13/18 1039  GLUCAP 74   Lipid Profile: No results for  input(s): CHOL, HDL, LDLCALC, TRIG, CHOLHDL, LDLDIRECT in the last 72 hours. Thyroid Function Tests: No results for input(s): TSH, T4TOTAL, FREET4, T3FREE, THYROIDAB in the last 72 hours. Anemia Panel: No results for input(s): VITAMINB12, FOLATE, FERRITIN, TIBC, IRON, RETICCTPCT in the last 72 hours. Sepsis Labs: Recent Labs  Lab 06/12/18 1142 06/12/18 1438 06/13/18 0109  LATICACIDVEN 3.3* 2.7* 1.6    Recent Results (from the past 240 hour(s))  MRSA PCR Screening     Status: Abnormal   Collection Time: 06/09/18  2:49 PM  Result Value Ref Range Status   MRSA  by PCR POSITIVE (A) NEGATIVE Final    Comment:        The GeneXpert MRSA Assay (FDA approved for NASAL specimens only), is one component of a comprehensive MRSA colonization surveillance program. It is not intended to diagnose MRSA infection nor to guide or monitor treatment for MRSA infections. RESULT CALLED TO, READ BACK BY AND VERIFIED WITH: Mohammed Kindle RN 17:30 06/09/18 ([email or web address]) Performed at [facility], [place] [address], [place], [place] [id number]   Blood Culture (routine x 2)     Status: None (Preliminary result)   Collection Time: 06/12/18 11:41 AM  Result Value Ref Range Status   Specimen Description BLOOD LEFT ARM DRAWN BY RN  Final   Special Requests   Final    BOTTLES DRAWN AEROBIC AND ANAEROBIC Blood Culture adequate volume   Culture   Final    NO GROWTH 2 DAYS Performed at [facility], [address] [place], [place] [address]    Report Status PENDING  Incomplete  Blood Culture (routine x 2)     Status: None (Preliminary result)   Collection Time: 06/12/18 11:46 AM  Result Value Ref Range Status   Specimen Description LEFT ANTECUBITAL DRAWN BY RN  Final   Special Requests   Final    BOTTLES DRAWN AEROBIC AND ANAEROBIC Blood Culture adequate volume   Culture   Final    NO GROWTH 2 DAYS Performed at [facility], [address] [place], [place] [address]    Report Status PENDING  Incomplete          Radiology Studies: Dg Hand 2 View Right  Result Date: 06/12/2018 CLINICAL DATA:  Acute right hand pain and swelling. EXAM: RIGHT HAND - 2 VIEW COMPARISON:  None. FINDINGS: There is no evidence of fracture or dislocation. There is no evidence of arthropathy or other focal bone abnormality. Vascular calcifications are noted. Dorsal soft tissue swelling is noted which contains gas consistent with cellulitis. Severe enlargement of the third and fourth fingers is noted consistent with infection. Gas is present and gangrene can not be excluded. IMPRESSION: Dorsal soft tissue swelling is noted which contains gas consistent with cellulitis. Severe soft tissue enlargement of the third and fourth fingers is noted as well which contains gas concerning for cellulitis or possibly gangrene. No definite lytic destruction is seen to suggest osteomyelitis. Electronically Signed   By: Marijo Conception, M.D.   On: 06/12/2018 13:56        Scheduled Meds: . heparin  5,000 Units Subcutaneous Q8H   Continuous Infusions: . sodium chloride 50 mL/hr at 06/13/18 1711  . piperacillin-tazobactam (ZOSYN)  IV 3.375 g (06/14/18 0300)  . [START ON 06/15/2018] vancomycin       LOS: 2 days        Aline August, MD [facility] Pager [phone]  If 7PM-7AM, please contact night-coverage [email or web address] Password [facility] 06/14/2018, 10:33 AM

## 2018-06-14 NOTE — Progress Notes (Signed)
S: mild hand pain  O:Blood pressure (!) 99/47, pulse 78, temperature 97.8 F (36.6 C), temperature source Oral, resp. rate 18, height 5\' 5"  (1.651 m), weight 79.3 kg, SpO2 100 %.  Dressing changed, packing removed, some purulent drainage, wound irrigated with saline flushes, re bandaged  A:s/p I&D L hand, amputation of LF - infection   P: will start wound wash outs tid, pt may need further I&D, will assess daily.  Cont bs abx.

## 2018-06-14 NOTE — Progress Notes (Signed)
[facility] Progress Note  Subjective: no c/o today  Vitals:   06/13/18 2106 06/14/18 0025 06/14/18 0622 06/14/18 1029  BP:   (!) 97/48 (!) 99/47  Pulse:   70 78  Resp:   16 18  Temp: 99 F (37.2 C) 98.9 F (37.2 C)  97.8 F (36.6 C)  TempSrc: Oral Oral  Oral  SpO2:   100% 100%  Weight:   79.3 kg   Height:        Inpatient medications: . heparin  5,000 Units Subcutaneous Q8H   . piperacillin-tazobactam (ZOSYN)  IV 3.375 g (06/14/18 0300)  . [START ON 06/15/2018] vancomycin     acetaminophen **OR** acetaminophen, HYDROcodone-acetaminophen, HYDROmorphone (DILAUDID) injection, ondansetron **OR** ondansetron (ZOFRAN) IV, senna-docusate  Iron/TIBC/Ferritin/ %Sat No results found for: IRON, TIBC, FERRITIN, IRONPCTSAT  Exam: Gen alert, no distress, post-op lying in bed, no distress  No jvd or bruits Chest clear bilat no rales or wheezing RRR no MRG Abd soft ntnd no mass or ascites +bs Ext no sig LE edema, 2+ RUE edema, R hand bandaged sp 2nd finger amputation Neuro is alert, Ox 3 , nf   Home meds:  - atorvastatin 40/ nitroglycerin patch 0.2mg  qd/ pentoxifylline  - cinacalcet 60 hs/ sevelamer 2-3 ac tid/ potassium po qd  - ibuprofen 600 qid prn/ oxycodone-acetaminophen 5-325 qid prn/ tramadol 50mg  tid prn/ prn ondansetron 8mg  tid  - vitmains  Dialysis: [facility] MWF  4h  77kg   Heparin 1000 then 2000/hr   R IJ TDC (R AVG sp ligation)   CXR 2/11 no edema  Assessment: 1. Gangrene sp amputation R 2nd finger: per surgeon's has some drainage today, may need further I&D. Getting IV abx.  2. Hypotension - BP's here in 90's, no symptoms.  BP's recent admit were normal 130/ 80 range.  Will order blood cx's.  3. ESRD on HD MWF. HD tomorrow, get stand wt today.  4. 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 HD tomorrow. Min UF given hypotension.    [facility] 06/14/2018, 12:06 PM  Recent Labs  Lab  06/13/18 0426 06/13/18 1842 06/14/18 1032  NA 137 132* 137  K 3.8 3.9 4.4  CL 101 100 104  CO2 23 21* 21*  GLUCOSE 87 94 77  BUN 25* 33* 41*  CREATININE 6.47* 7.50* 8.66*  CALCIUM 9.4 8.7* 9.2  PHOS 3.8  --  5.6*  ALBUMIN 2.1*  --  1.9*   Recent Labs  Lab 06/12/18 1141 06/13/18 0109  AST 20 18  ALT <5 7  ALKPHOS 79 70  BILITOT 0.6 0.8  PROT 8.1 7.2   Recent Labs  Lab 06/13/18 1245 06/14/18 1032  WBC 19.0* 18.4*  NEUTROABS 16.2* 14.2*  HGB 11.8* 11.8*  HCT 37.6* 36.7*  MCV 63.1* 63.7*  PLT 350 343

## 2018-06-15 ENCOUNTER — Inpatient Hospital Stay (HOSPITAL_COMMUNITY): Payer: Medicare Other

## 2018-06-15 LAB — RENAL FUNCTION PANEL
Albumin: 2 g/dL — ABNORMAL LOW (ref 3.5–5.0)
Anion gap: 15 (ref 5–15)
BUN: 51 mg/dL — ABNORMAL HIGH (ref 8–23)
CO2: 23 mmol/L (ref 22–32)
Calcium: 9.1 mg/dL (ref 8.9–10.3)
Chloride: 100 mmol/L (ref 98–111)
Creatinine, Ser: 10.73 mg/dL — ABNORMAL HIGH (ref 0.61–1.24)
GFR calc non Af Amer: 4 mL/min — ABNORMAL LOW (ref >60)
GFR, EST AFRICAN AMERICAN: 5 mL/min — AB (ref >60)
Glucose, Bld: 102 mg/dL — ABNORMAL HIGH (ref 70–99)
Phosphorus: 4.7 mg/dL — ABNORMAL HIGH (ref 2.5–4.6)
Potassium: 3.9 mmol/L (ref 3.5–5.1)
Sodium: 138 mmol/L (ref 135–145)

## 2018-06-15 LAB — CBC WITH DIFFERENTIAL/PLATELET
Abs Immature Granulocytes: 0.14 10*3/uL — ABNORMAL HIGH (ref 0.00–0.07)
BASOS ABS: 0.1 10*3/uL (ref 0.0–0.1)
Basophils Relative: 1 %
Eosinophils Absolute: 0.6 10*3/uL — ABNORMAL HIGH (ref 0.0–0.5)
Eosinophils Relative: 4 %
HCT: 38.8 % — ABNORMAL LOW (ref 39.0–52.0)
Hemoglobin: 12 g/dL — ABNORMAL LOW (ref 13.0–17.0)
Immature Granulocytes: 1 %
LYMPHS ABS: 2 10*3/uL (ref 0.7–4.0)
LYMPHS PCT: 13 %
MCH: 20 pg — ABNORMAL LOW (ref 26.0–34.0)
MCHC: 30.9 g/dL (ref 30.0–36.0)
MCV: 64.8 fL — ABNORMAL LOW (ref 80.0–100.0)
Monocytes Absolute: 0.8 10*3/uL (ref 0.1–1.0)
Monocytes Relative: 5 %
NRBC: 0 % (ref 0.0–0.2)
Neutro Abs: 11.9 10*3/uL — ABNORMAL HIGH (ref 1.7–7.7)
Neutrophils Relative %: 76 %
Platelets: 389 10*3/uL (ref 150–400)
RBC: 5.99 MIL/uL — ABNORMAL HIGH (ref 4.22–5.81)
RDW: 20.8 % — ABNORMAL HIGH (ref 11.5–15.5)
WBC: 15.6 10*3/uL — ABNORMAL HIGH (ref 4.0–10.5)

## 2018-06-15 LAB — HEPATITIS B SURFACE ANTIGEN: Hepatitis B Surface Ag: NEGATIVE

## 2018-06-15 MED ORDER — HEPARIN SODIUM (PORCINE) 1000 UNIT/ML IJ SOLN
INTRAMUSCULAR | Status: AC
  Administered 2018-06-15: 1000 [IU]
  Filled 2018-06-15: qty 3

## 2018-06-15 MED ORDER — ALTEPLASE 2 MG IJ SOLR
INTRAMUSCULAR | Status: AC
  Filled 2018-06-15: qty 4

## 2018-06-15 MED ORDER — ALTEPLASE 2 MG IJ SOLR
2.0000 mg | Freq: Once | INTRAMUSCULAR | Status: AC | PRN
  Administered 2018-06-15: 2 mg

## 2018-06-15 NOTE — Evaluation (Signed)
Physical Therapy Evaluation Patient Details Name: Joseph SHOULTZ Sr. MRN: [id number] DOB: 12-21-1940 Today's Date: 06/15/2018   History of Present Illness  Pt is a 78 y.o. male who presented with severe sepsis, s/p R long finger amputation. PMH: R transmetatarsal amputation, ESRD on dialysis, CVA, aortic stenosis, HTN, anxiety, cardiomyopathy, shoulder surgery.   Clinical Impression   Pt received in bed, willing to participate in therapy. He reports pain 5/10 in R hand. Instructed pt in NWB through R hand to protect surgical site. Monitored BP in supine, WNL, BP in sitting increased but WNL; note that BP is being taken around leg so suspect increase in sitting may be due to leg position change. Bed mobility with S, sit to stand transfer with minA from low bed, minA for standing balance. Pt reports multiple falls in recent weeks. Unable to attempt ambulation at this time due to pt's HD appointment. Concerned about falls if pt were to go home due to unsteadiness on feet, where he also has no caregiver available. Based on functional mobility observed and history of falls, recommending CIR or SNF, pending mobility progression. Pt will benefit from acute physical therapy services to progress his functional mobility so that he is safe for DC.     Follow Up Recommendations CIR;SNF(CIR vs SNF pending progress)    Equipment Recommendations  Other (comment)(pt has necessary equipment at home)    Recommendations for Other Services Rehab consult     Precautions / Restrictions Precautions Precautions: Fall Restrictions Weight Bearing Restrictions: Yes RUE Weight Bearing: Non weight bearing      Mobility  Bed Mobility Overal bed mobility: Needs Assistance Bed Mobility: Supine to Sit;Sit to Supine     Supine to sit: Supervision;HOB elevated Sit to supine: Supervision;HOB elevated   General bed mobility comments: Pt able to move from supine to sit and back without physical assistance, S for  line management, HOB elevated, use of bed rails.  Transfers Overall transfer level: Needs assistance Equipment used: None Transfers: Sit to/from Stand Sit to Stand: Min assist         General transfer comment: Pt required minA to boost from sitting on low bed, minA to regain balance once in standing.   Ambulation/Gait             General Gait Details: unable to attempt due to pt being picked up for dialysis appointment  Stairs            Wheelchair Mobility    Modified Rankin (Stroke Patients Only)       Balance Overall balance assessment: Needs assistance;History of Falls Sitting-balance support: Bilateral upper extremity supported;Feet supported Sitting balance-Leahy Scale: Fair Sitting balance - Comments: Pt able to maintain sitting balance on EOB safely without assist.      Standing balance-Leahy Scale: Poor Standing balance comment: Upon standing, pt required minA to regain balance and cuing to adjust feet to find balance. Reports history of falling.                             Pertinent Vitals/Pain Pain Assessment: 0-10 Pain Score: 5  Pain Location: R hand Pain Descriptors / Indicators: Operative site guarding Pain Intervention(s): Monitored during session    Home Living Family/patient expects to be discharged to:: Private residence Living Arrangements: Alone Available Help at Discharge: Other (Comment)(none per pt) Type of Home: Apartment Home Access: Level entry     Home Layout: One level Home Equipment: Gilford Rile -  2 wheels;Cane - single point      Prior Function Level of Independence: Independent with assistive device(s)         Comments: Pt states that he uses RW or cane when walking longer distances, but when he takes his time he walks without AD. Reports history of multiple falls in recent weeks-legs giving out.     Hand Dominance        Extremity/Trunk Assessment   Upper Extremity Assessment Upper Extremity  Assessment: Defer to OT evaluation    Lower Extremity Assessment Lower Extremity Assessment: Generalized weakness       Communication   Communication: No difficulties  Cognition Arousal/Alertness: Awake/alert Behavior During Therapy: WFL for tasks assessed/performed Overall Cognitive Status: Within Functional Limits for tasks assessed                                        General Comments      Exercises     Assessment/Plan    PT Assessment Patient needs continued PT services  PT Problem List Decreased strength;Decreased balance;Decreased knowledge of precautions;Pain;Decreased mobility;Decreased activity tolerance;Decreased coordination;Decreased safety awareness       PT Treatment Interventions DME instruction;Functional mobility training;Balance training;Patient/family education;Gait training;Therapeutic activities;Neuromuscular re-education;Therapeutic exercise;Stair training    PT Goals (Current goals can be found in the Care Plan section)  Acute Rehab PT Goals Patient Stated Goal: get better PT Goal Formulation: With patient Time For Goal Achievement: 06/29/18 Potential to Achieve Goals: Good    Frequency Min 3X/week   Barriers to discharge Decreased caregiver support      Co-evaluation               AM-PAC PT "6 Clicks" Mobility  Outcome Measure Help needed turning from your back to your side while in a flat bed without using bedrails?: A Little Help needed moving from lying on your back to sitting on the side of a flat bed without using bedrails?: A Little Help needed moving to and from a bed to a chair (including a wheelchair)?: A Little Help needed standing up from a chair using your arms (e.g., wheelchair or bedside chair)?: A Little Help needed to walk in hospital room?: A Lot Help needed climbing 3-5 steps with a railing? : A Lot 6 Click Score: 16    End of Session Equipment Utilized During Treatment: Gait belt Activity  Tolerance: Patient tolerated treatment well Patient left: in bed;Other (comment)(on the way to dialysis)   PT Visit Diagnosis: Unsteadiness on feet (R26.81);Muscle weakness (generalized) (M62.81);Repeated falls (R29.6);Other abnormalities of gait and mobility (R26.89)    Time:  -      Charges:              Ronnell Guadalajara, SPT   Ronnell Guadalajara 06/15/2018, 12:14 PM

## 2018-06-15 NOTE — Consult Note (Signed)
[facility] Nurse wound consult note Reason for Consult: Consult requested for left foot.  Patient states he is followed as an outpatient by podiatry for a chronic callous and foot wound. Wound type: Left plantar foot with full thickness wound Measurement:1X.5X.3cm Wound bed: dry dark red  Drainage (amount, consistency, odor) small amt pink drainage, no odor Periwound: surrounded by dry dark brown calloused edges Dressing procedure/placement/frequency: Aquacel to provide antimicrobial benefits and promote healing.  Discussed plan of care with patient and he plans to follow-up with podiatry after discharge. Please re-consult if further assistance is needed.  Thank-you,  Julien Girt MSN, [place], [place], [place], [place]

## 2018-06-15 NOTE — Progress Notes (Signed)
[facility] Progress Note  Subjective: no c/o today  Vitals:   06/14/18 1725 06/14/18 2020 06/15/18 0056 06/15/18 1225  BP: (!) 87/65  (!) 100/59 (!) (P) 121/58  Pulse: 89  76 (P) 78  Resp: (!) 22  18 (P) 13  Temp: 98.1 F (36.7 C) 98.2 F (36.8 C)  (!) (P) 97 F (36.1 C)  TempSrc: Oral   (P) Oral  SpO2: 97%  100%   Weight:      Height:        Inpatient medications: . Chlorhexidine Gluconate Cloth  6 each Topical Q0600  . heparin  5,000 Units Subcutaneous Q8H   . piperacillin-tazobactam (ZOSYN)  IV 3.375 g (06/15/18 0102)  . vancomycin     acetaminophen **OR** acetaminophen, HYDROcodone-acetaminophen, HYDROmorphone (DILAUDID) injection, ondansetron **OR** ondansetron (ZOFRAN) IV, senna-docusate  Iron/TIBC/Ferritin/ %Sat No results found for: IRON, TIBC, FERRITIN, IRONPCTSAT  Exam: Gen alert, no distress No jvd or bruits Chest clear bilat RRR no MRG Abd soft ntnd no mass or ascites +bs Ext R hand bandaged sp 2nd finger amputation, no LE edema Neuro is alert, Ox 3 , nf   Home meds:  - atorvastatin 40/ nitroglycerin patch 0.2mg  qd/ pentoxifylline  - cinacalcet 60 hs/ sevelamer 2-3 ac tid/ potassium po qd  - oxycodone-acetaminophen 5-325 qid prn/ tramadol 50mg  tid prn/ prn ondansetron 8mg  tid/ ibuprofen prn  - vitmains  Dialysis: [facility] MWF  4h  77kg   Heparin 1000 then 2000/hr   R IJ TDC (R AVG sp ligation)   CXR 2/11 no edema  Assessment/ Plan: 1. 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  2. Hypotension - resolved, BCx's negative. BP's better 120/60.  3. ESRD on HD MWF. HD today, minimal UF.  4. 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

## 2018-06-15 NOTE — Progress Notes (Signed)
Patient ID: Joseph Hartshorn., male   DOB: 1941/02/09, 78 y.o.   MRN: [id number]  PROGRESS NOTE    [name]  TWS:[id number] DOB: 1940-10-12 DOA: 06/12/2018 PCP: [name], MD   Brief Narrative: 78 year old male with history of hypertension, peripheral vascular disease, nonobstructive coronary artery disease, nonischemic cardiomyopathy, end-stage renal disease on dialysis, recent steal syndrome and recently discharged from [facility] on 06/10/2018 after having a ligation of the right AV graft and placement of right subclavian TDC by Dr. Doren Custard on 06/09/2018 along with gangrene of his right second and third finger for which she was discharged on oral antibiotics presented with worsening right hand pain and was transferred from [facility] for the same.  Vascular surgery and hand surgery were consulted.  Patient was started on IV antibiotics.  Patient underwent amputation of right long finger.   Assessment & Plan:   Principal Problem:   Severe sepsis ([place]) Active Problems:   ESRD on dialysis ([place])   Non-ischemic cardiomyopathy- EF 35- 45%   CAD- 40-50% LAD at cath 07/11/15   Aspirin intolerance   Moderate aortic stenosis   PVD (peripheral vascular disease) (HCC)   S/P transmetatarsal amputation of foot, right (HCC)   Gangrene of finger (HCC)   Leukocytosis   MRSA (methicillin resistant staph aureus) culture positive   Essential hypertension   Cellulitis and gangrene of the finger of the right hand -Currently on broad-spectrum antibiotics.  -Status post amputation of right long finger on 06/13/2018 by hand surgery.  Wound care and dressing changes as per hand surgery recommendations.  Will follow further recommendations from hand surgery.  Continue broad-spectrum antibiotics for now.  Leukocytosis -from above.  Continue antibiotics.  Monitor  Severe sepsis -From above.  Hemodynamically stable.  Cultures negative so far.  Recent steal syndrome status post ligation  of right AV graft and placement of right subclavian TDC by vascular surgery -Vascular surgery has signed off.  Outpatient follow-up with vascular surgery.  End-stage renal disease on hemodialysis -Nephrology following.  Dialysis as per nephrology schedule.  Hypotension in a patient with essential hypertension  -Blood pressure stable currently after gentle fluid boluses.  Antihypertensives on hold.  Monitor  Nonischemic cardiomyopathy/nonobstructive coronary artery disease -Apparently patient had normalization of LVEF as of echocardiogram in February 2017 -Currently no chest pain.  Strict input and output.  Daily weights.  Volume managed by dialysis.   DVT prophylaxis: Heparin Code Status: Full Family Communication: None at bedside Disposition Plan: Discharge home once cleared by hand surgery.  Consultants: Vascular surgery/hand surgery/nephrology  Procedures: Amputation of right long finger on 06/13/2018  Antimicrobials: Vancomycin and Zosyn from 06/12/2018 onwards   Subjective: Patient seen and examined at bedside.  He denies worsening right hand pain.  No overnight fever, nausea or vomiting.   Objective: Vitals:   06/14/18 1500 06/14/18 1725 06/14/18 2020 06/15/18 0056  BP: 99/73 (!) 87/65  (!) 100/59  Pulse: 83 89  76  Resp: 17 (!) 22  18  Temp:  98.1 F (36.7 C) 98.2 F (36.8 C)   TempSrc:  Oral    SpO2: 98% 97%  100%  Weight:      Height:        Intake/Output Summary (Last 24 hours) at 06/15/2018 0853 Last data filed at 06/14/2018 1800 Gross per 24 hour  Intake 240 ml  Output 0 ml  Net 240 ml   Filed Weights   06/12/18 1122 06/14/18 0622  Weight: 77.1 kg 79.3  kg    Examination:  General exam: No acute distress.  Alert but a poor historian. Respiratory system: Bilateral decreased breath sounds at bases with some basilar crackles  cardiovascular system: S1-S2 heard, rate controlled Gastrointestinal system: Abdomen is nondistended, soft and nontender. Normal  bowel sounds heard. Extremities: No cyanosis,edema.  Right hand dressing present  Data Reviewed: I have personally reviewed following labs and imaging studies  CBC: Recent Labs  Lab 06/09/18 1232 06/12/18 1141 06/12/18 1845 06/13/18 1245 06/14/18 1032  WBC 26.4* 23.8* 22.8* 19.0* 18.4*  NEUTROABS  --  20.5*  --  16.2* 14.2*  HGB 14.3 13.2 13.0 11.8* 11.8*  HCT 44.3 42.1 42.2 37.6* 36.7*  MCV 63.6* 64.1* 63.3* 63.1* 63.7*  PLT PLATELET CLUMPS NOTED ON SMEAR, COUNT APPEARS ADEQUATE 382 366 350 449   Basic Metabolic Panel: Recent Labs  Lab 06/12/18 1141 06/12/18 1845 06/13/18 0109 06/13/18 0426 06/13/18 1842 06/14/18 1032  NA 136  --  136 137 132* 137  K 3.3*  --  3.7 3.8 3.9 4.4  CL 96*  --  100 101 100 104  CO2 28  --  25 23 21* 21*  GLUCOSE 84  --  105* 87 94 77  BUN 19  --  25* 25* 33* 41*  CREATININE 5.07* 5.70* 6.20* 6.47* 7.50* 8.66*  CALCIUM 9.0  --  9.4 9.4 8.7* 9.2  MG  --   --  1.9 1.8 1.8 1.9  PHOS  --   --   --  3.8  --  5.6*   GFR: Estimated Creatinine Clearance: 7.2 mL/min (A) (by C-G formula based on SCr of 8.66 mg/dL (H)). Liver Function Tests: Recent Labs  Lab 06/12/18 1141 06/13/18 0109 06/13/18 0426 06/14/18 1032  AST 20 18  --   --   ALT <5 7  --   --   ALKPHOS 79 70  --   --   BILITOT 0.6 0.8  --   --   PROT 8.1 7.2  --   --   ALBUMIN 2.8* 2.2* 2.1* 1.9*   No results for input(s): LIPASE, AMYLASE in the last 168 hours. No results for input(s): AMMONIA in the last 168 hours. Coagulation Profile: No results for input(s): INR, PROTIME in the last 168 hours. Cardiac Enzymes: No results for input(s): CKTOTAL, CKMB, CKMBINDEX, TROPONINI in the last 168 hours. BNP (last 3 results) No results for input(s): PROBNP in the last 8760 hours. HbA1C: No results for input(s): HGBA1C in the last 72 hours. CBG: Recent Labs  Lab 06/13/18 1039  GLUCAP 74   Lipid Profile: No results for input(s): CHOL, HDL, LDLCALC, TRIG, CHOLHDL, LDLDIRECT in  the last 72 hours. Thyroid Function Tests: No results for input(s): TSH, T4TOTAL, FREET4, T3FREE, THYROIDAB in the last 72 hours. Anemia Panel: No results for input(s): VITAMINB12, FOLATE, FERRITIN, TIBC, IRON, RETICCTPCT in the last 72 hours. Sepsis Labs: Recent Labs  Lab 06/12/18 1142 06/12/18 1438 06/13/18 0109  LATICACIDVEN 3.3* 2.7* 1.6    Recent Results (from the past 240 hour(s))  MRSA PCR Screening     Status: Abnormal   Collection Time: 06/09/18  2:49 PM  Result Value Ref Range Status   MRSA by PCR POSITIVE (A) NEGATIVE Final    Comment:        The GeneXpert MRSA Assay (FDA approved for NASAL specimens only), is one component of a comprehensive MRSA colonization surveillance program. It is not intended to diagnose MRSA infection nor to guide or monitor treatment  for MRSA infections. RESULT CALLED TO, READ BACK BY AND VERIFIED WITH: Mohammed Kindle RN 17:30 06/09/18 ([email or web address]) Performed at [facility], [place] [address] [place], [place] [address]   Blood Culture (routine x 2)     Status: None (Preliminary result)   Collection Time: 06/12/18 11:41 AM  Result Value Ref Range Status   Specimen Description BLOOD LEFT ARM DRAWN BY RN  Final   Special Requests   Final    BOTTLES DRAWN AEROBIC AND ANAEROBIC Blood Culture adequate volume   Culture   Final    NO GROWTH 3 DAYS Performed at [facility], [address] [place], [place] [address]    Report Status PENDING  Incomplete  Blood Culture (routine x 2)     Status: None (Preliminary result)   Collection Time: 06/12/18 11:46 AM  Result Value Ref Range Status   Specimen Description LEFT ANTECUBITAL DRAWN BY RN  Final   Special Requests   Final    BOTTLES DRAWN AEROBIC AND ANAEROBIC Blood Culture adequate volume   Culture   Final    NO GROWTH 3 DAYS Performed at [facility], [address] [place], [place] [address]    Report Status PENDING  Incomplete         Radiology Studies: No results  found.      Scheduled Meds: . Chlorhexidine Gluconate Cloth  6 each Topical Q0600  . heparin  5,000 Units Subcutaneous Q8H   Continuous Infusions: . piperacillin-tazobactam (ZOSYN)  IV 3.375 g (06/15/18 0102)  . vancomycin       LOS: 3 days        Aline August, MD [facility] Pager [phone]  If 7PM-7AM, please contact night-coverage [email or web address] Password [facility] 06/15/2018, 8:53 AM

## 2018-06-16 LAB — RENAL FUNCTION PANEL
Albumin: 1.9 g/dL — ABNORMAL LOW (ref 3.5–5.0)
Anion gap: 13 (ref 5–15)
BUN: 32 mg/dL — ABNORMAL HIGH (ref 8–23)
CO2: 22 mmol/L (ref 22–32)
Calcium: 9.2 mg/dL (ref 8.9–10.3)
Chloride: 101 mmol/L (ref 98–111)
Creatinine, Ser: 8.43 mg/dL — ABNORMAL HIGH (ref 0.61–1.24)
GFR calc Af Amer: 6 mL/min — ABNORMAL LOW (ref >60)
GFR, EST NON AFRICAN AMERICAN: 6 mL/min — AB (ref >60)
Glucose, Bld: 93 mg/dL (ref 70–99)
Phosphorus: 4.7 mg/dL — ABNORMAL HIGH (ref 2.5–4.6)
Potassium: 4 mmol/L (ref 3.5–5.1)
Sodium: 136 mmol/L (ref 135–145)

## 2018-06-16 LAB — CBC WITH DIFFERENTIAL/PLATELET
BASOS PCT: 1 %
Basophils Absolute: 0.1 10*3/uL (ref 0.0–0.1)
EOS ABS: 0.5 10*3/uL (ref 0.0–0.5)
Eosinophils Relative: 4 %
HCT: 35.4 % — ABNORMAL LOW (ref 39.0–52.0)
Hemoglobin: 11.4 g/dL — ABNORMAL LOW (ref 13.0–17.0)
Lymphocytes Relative: 13 %
Lymphs Abs: 1.7 10*3/uL (ref 0.7–4.0)
MCH: 19.8 pg — ABNORMAL LOW (ref 26.0–34.0)
MCHC: 32.2 g/dL (ref 30.0–36.0)
MCV: 61.5 fL — ABNORMAL LOW (ref 80.0–100.0)
Monocytes Absolute: 1 10*3/uL (ref 0.1–1.0)
Monocytes Relative: 8 %
Neutro Abs: 10 10*3/uL — ABNORMAL HIGH (ref 1.7–7.7)
Neutrophils Relative %: 73 %
Platelets: 387 10*3/uL (ref 150–400)
RBC: 5.76 MIL/uL (ref 4.22–5.81)
RDW: 18.6 % — ABNORMAL HIGH (ref 11.5–15.5)
WBC: 13.5 10*3/uL — AB (ref 4.0–10.5)
nRBC: 0 % (ref 0.0–0.2)

## 2018-06-16 LAB — HEPATITIS B CORE ANTIBODY, TOTAL: Hep B Core Total Ab: NEGATIVE

## 2018-06-16 LAB — HEPATITIS B SURFACE ANTIBODY,QUALITATIVE: Hep B S Ab: REACTIVE

## 2018-06-16 MED ORDER — CHLORHEXIDINE GLUCONATE CLOTH 2 % EX PADS
6.0000 | MEDICATED_PAD | Freq: Every day | CUTANEOUS | Status: DC
  Administered 2018-06-16: 6 via TOPICAL

## 2018-06-16 MED ORDER — LOPERAMIDE HCL 2 MG PO CAPS
2.0000 mg | ORAL_CAPSULE | ORAL | Status: DC | PRN
  Administered 2018-06-16: 2 mg via ORAL
  Filled 2018-06-16: qty 1

## 2018-06-16 NOTE — Progress Notes (Signed)
[facility] Progress Note  Subjective: no c/o today  Vitals:   06/15/18 1600 06/15/18 1620 06/15/18 2139 06/16/18 0452  BP: (!) 152/67 (!) 147/80 118/73 131/76  Pulse: 82 79 86 85  Resp:  18 18 16   Temp:  97.9 F (36.6 C) 98.5 F (36.9 C) 98.2 F (36.8 C)  TempSrc:  Oral Oral Oral  SpO2:  100% 95% 99%  Weight:  78 kg    Height:        Inpatient medications: . Chlorhexidine Gluconate Cloth  6 each Topical Q0600  . heparin  5,000 Units Subcutaneous Q8H   . piperacillin-tazobactam (ZOSYN)  IV 3.375 g (06/16/18 0238)  . vancomycin 750 mg (06/15/18 1857)   acetaminophen **OR** acetaminophen, HYDROcodone-acetaminophen, HYDROmorphone (DILAUDID) injection, ondansetron **OR** ondansetron (ZOFRAN) IV, senna-docusate  Iron/TIBC/Ferritin/ %Sat No results found for: IRON, TIBC, FERRITIN, IRONPCTSAT  Exam: Gen alert, no distress No jvd or bruits Chest clear bilat RRR no MRG Abd soft ntnd no mass or ascites +bs Ext R 2nd finger amputation wrapped, no LE edema, 2+ RUE edema Neuro is alert, Ox 3 , nf   Home meds:  - atorvastatin 40/ nitroglycerin patch 0.2mg  qd/ pentoxifylline  - cinacalcet 60 hs/ sevelamer 2-3 ac tid/ potassium po qd  - oxycodone-acetaminophen 5-325 qid prn/ tramadol 50mg  tid prn/ prn ondansetron 8mg  tid/ ibuprofen prn  - vitmains  Dialysis: [facility] MWF  4h  77kg   Heparin 1000 then 2000/hr   R IJ TDC (R AVG sp ligation)   CXR 2/11 no edema  Assessment/ Plan: 1. 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  2. Hypotension - resolved, BCx's negative. BP's better 120/60.  3. ESRD on HD MWF. HD today, minimal UF.  4. 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

## 2018-06-16 NOTE — Progress Notes (Signed)
IP rehab admissions - Patient lives alone and has minimal help at discharge.  I feel that SNF is best option at this time.  Call me for questions.  [phone]

## 2018-06-16 NOTE — NC FL2 (Addendum)
[place] LEVEL OF CARE SCREENING TOOL     IDENTIFICATION  Patient Name: Joseph PFEIFLE Sr. Birthdate: [birth year] Sex: male Admission Date (Current Location): 06/12/2018  [facility] [address] and [place] Number:  [employer] and Address:  [facility] [place], [place] [address] [place], [place] [address]      Provider Number: [id number]  Attending Physician Name and Address:  Aline August, MD  Relative Name and Phone Number:  [name], daughter, [phone]    Current Level of Care: Hospital Recommended Level of Care: [place] Prior Approval Number:    Date Approved/Denied:   PASRR Number: [id number]  Discharge Plan: SNF    Current Diagnoses: Patient Active Problem List   Diagnosis Date Noted  . Severe sepsis ([place]) 06/12/2018  . Leukocytosis 06/12/2018  . MRSA (methicillin resistant staph aureus) culture positive 06/12/2018  . Essential hypertension 06/12/2018  . ESRD (end stage renal disease) ([place]) 06/09/2018  . Gangrene of finger ([place]) 06/04/2018  . S/P transmetatarsal amputation of foot, right ([place]) 05/09/2017  . PVD (peripheral vascular disease) ([place]) 04/15/2017  . Nonischemic cardiomyopathy ([place]) 08/15/2015  . Aortic stenosis 08/15/2015  . Moderate aortic stenosis 07/12/2015  . 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 CAD- 40-50% LAD at cath 07/11/15 07/11/2015  . Aspirin intolerance 07/11/2015  . Abnormal stress test   . ESRD on dialysis ([place]) 05/30/2015  . CTS (carpal tunnel syndrome) 02/28/2015  . PVD of LE - Dr Oneida Alar follows 08/20/2012  . Midfoot skin ulcer, right, limited to breakdown of skin ([place]) 01/16/2012  . Encounter for screening colonoscopy 10/08/2011  . Melena 10/08/2011  . Other complications due to renal dialysis device, implant, and graft 09/24/2011    Orientation RESPIRATION BLADDER Height & Weight     Self, Time, Situation, Place  Normal Continent Weight: 78 kg Height:  5\' 5"  (165.1  cm)  BEHAVIORAL SYMPTOMS/MOOD NEUROLOGICAL BOWEL NUTRITION STATUS  (None)   Continent Diet(Please see DC Summary)  AMBULATORY STATUS COMMUNICATION OF NEEDS Skin   Limited Assist Verbally Surgical wounds(Closed incision on arm, neck; diabetic ulcer on heel)                       Personal Care Assistance Level of Assistance  Bathing, Feeding, Dressing Bathing Assistance: Limited assistance Feeding assistance: Independent Dressing Assistance: Limited assistance     Functional Limitations Info  Sight, Hearing, Speech Sight Info: Adequate Hearing Info: Adequate Speech Info: Adequate    SPECIAL CARE FACTORS FREQUENCY  PT (By licensed PT), OT (By licensed OT)     PT Frequency: 5x/week OT Frequency: 3x/week            Contractures Contractures Info: Not present    Additional Factors Info  Code Status, Allergies, Isolation Precautions Code Status Info: Full Allergies Info: Aspirin     Isolation Precautions Info: MRSA in the nose     Current Medications (06/16/2018):  This is the current hospital active medication list Current Facility-Administered Medications  Medication Dose Route Frequency Provider Last Rate Last Dose  . acetaminophen (TYLENOL) tablet 650 mg  650 mg Oral Q6H PRN Johnson, Clanford L, MD   650 mg at 06/14/18 1736   Or  . acetaminophen (TYLENOL) suppository 650 mg  650 mg Rectal Q6H PRN Johnson, Clanford L, MD      . Chlorhexidine Gluconate Cloth 2 % PADS 6 each  6 each Topical G8115 Roney Jaffe, MD   6 each at 06/16/18 [phone]  .  heparin injection 5,000 Units  5,000 Units Subcutaneous Q8H Johnson, Clanford L, MD   5,000 Units at 06/16/18 0555  . HYDROcodone-acetaminophen (NORCO/VICODIN) 5-325 MG per tablet 1-2 tablet  1-2 tablet Oral Q6H PRN Wynetta Emery, Clanford L, MD   2 tablet at 06/16/18 1031  . HYDROmorphone (DILAUDID) injection 0.5 mg  0.5 mg Intravenous Q4H PRN Johnson, Clanford L, MD   0.5 mg at 06/15/18 0100  . ondansetron (ZOFRAN) tablet 4 mg  4  mg Oral Q6H PRN Johnson, Clanford L, MD       Or  . ondansetron (ZOFRAN) injection 4 mg  4 mg Intravenous Q6H PRN Wynetta Emery, Clanford L, MD   4 mg at 06/14/18 2216  . piperacillin-tazobactam (ZOSYN) IVPB 3.375 g  3.375 g Intravenous Q12H Johnson, Clanford L, MD 12.5 mL/hr at 06/16/18 0238 3.375 g at 06/16/18 0238  . senna-docusate (Senokot-S) tablet 1 tablet  1 tablet Oral BID PRN Johnson, Clanford L, MD      . vancomycin (VANCOCIN) IVPB 750 mg/150 ml premix  750 mg Intravenous Q M,W,F-HD Lore, Melissa A, RPH 150 mL/hr at 06/15/18 1857 750 mg at 06/15/18 1857     Discharge Medications: Please see discharge summary for a list of discharge medications.  Relevant Imaging Results:  Relevant Lab Results:   Additional Information SSN: [id number]   Gets HD MWF at [place], [place]

## 2018-06-16 NOTE — Progress Notes (Signed)
Patient ID: [name]., male   DOB: 1941/04/22, 78 y.o.   MRN: [id number]  PROGRESS NOTE    [name]  QPR:[id number] DOB: 02/09/1941 DOA: 06/12/2018 PCP: [name], MD   Brief Narrative: 78 year old male with history of hypertension, peripheral vascular disease, nonobstructive coronary artery disease, nonischemic cardiomyopathy, end-stage renal disease on dialysis, recent steal syndrome and recently discharged from [facility] on 06/10/2018 after having a ligation of the right AV graft and placement of right subclavian TDC by Dr. Doren Custard on 06/09/2018 along with gangrene of his right second and third finger for which she was discharged on oral antibiotics presented with worsening right hand pain and was transferred from [facility] for the same.  Vascular surgery and hand surgery were consulted.  Patient was started on IV antibiotics.  Patient underwent amputation of right long finger.   Assessment & Plan:   Principal Problem:   Severe sepsis ([place]) Active Problems:   ESRD on dialysis ([place])   Non-ischemic cardiomyopathy- EF 35- 45%   CAD- 40-50% LAD at cath 07/11/15   Aspirin intolerance   Moderate aortic stenosis   PVD (peripheral vascular disease) (HCC)   S/P transmetatarsal amputation of foot, right (HCC)   Gangrene of finger (HCC)   Leukocytosis   MRSA (methicillin resistant staph aureus) culture positive   Essential hypertension   Cellulitis and gangrene of the finger of the right hand -Currently on broad-spectrum antibiotics.  -Status post amputation of right long finger on 06/13/2018 by hand surgery.  Wound care and dressing changes as per hand surgery recommendations.  Will follow further recommendations from hand surgery.  Continue broad-spectrum antibiotics for now.  Leukocytosis -from above.  Continue antibiotics.  Monitor  Severe sepsis -From above.  Hemodynamically stable.  Cultures negative so far.  Recent steal syndrome status post ligation  of right AV graft and placement of right subclavian TDC by vascular surgery -Vascular surgery has signed off.  Outpatient follow-up with vascular surgery.  History of right transmetatarsal amputation with possibility of osteomyelitis -Orthopedics consulted because x-ray of the right lower extremity is showing findings concerning for osteomyelitis.  Left plantar foot chronic wound -Wound care as per wound care consult recommendations.  Outpatient follow-up with podiatry/orthopedics  Probable moderate malnutrition with hypoalbuminemia -We will consult nutrition  End-stage renal disease on hemodialysis -Nephrology following.  Dialysis as per nephrology schedule.  Hypotension in a patient with essential hypertension  -Blood pressure much improved now.  Antihypertensives on hold.  Monitor.  If blood pressure remains elevated, consider restarting antihypertensives.  Nonischemic cardiomyopathy/nonobstructive coronary artery disease -Apparently patient had normalization of LVEF as of echocardiogram in February 2017 -Currently no chest pain.  Strict input and output.  Daily weights.  Volume managed by dialysis.   DVT prophylaxis: Heparin Code Status: Full Family Communication: None at bedside Disposition Plan: Discharge home once cleared by hand surgery.  Consultants: Vascular surgery/hand surgery/nephrology/orthopedics  Procedures: Amputation of right long finger on 06/13/2018  Antimicrobials: Vancomycin and Zosyn from 06/12/2018 onwards   Subjective: Patient seen and examined at bedside.  He denies any worsening abdominal pain, shortness of breath, chest pain, nausea or vomiting.  No worsening right hand pain.   Objective: Vitals:   06/15/18 1600 06/15/18 1620 06/15/18 2139 06/16/18 0452  BP: (!) 152/67 (!) 147/80 118/73 131/76  Pulse: 82 79 86 85  Resp:  18 18 16   Temp:  97.9 F (36.6 C) 98.5 F (36.9 C) 98.2 F (36.8 C)  TempSrc:  Oral Oral Oral  SpO2:  100% 95% 99%  Weight:   78 kg    Height:        Intake/Output Summary (Last 24 hours) at 06/16/2018 0958 Last data filed at 06/16/2018 0919 Gross per 24 hour  Intake 470 ml  Output 1538 ml  Net -1068 ml   Filed Weights   06/14/18 0622 06/15/18 1225 06/15/18 1620  Weight: 79.3 kg 79.4 kg 78 kg    Examination:  General exam: No distress.  Awake but a poor historian.   Respiratory system: Bilateral decreased breath sounds at bases with some basilar crackles, no wheezing cardiovascular system: Rate controlled, S1-S2 heard Gastrointestinal system: Abdomen is nondistended, soft and nontender. Normal bowel sounds heard. Extremities: Right hand dressing present.  No cyanosis or clubbing.  Right transmetatarsal amputation present Data Reviewed: I have personally reviewed following labs and imaging studies  CBC: Recent Labs  Lab 06/12/18 1141 06/12/18 1845 06/13/18 1245 06/14/18 1032 06/15/18 0727 06/16/18 0325  WBC 23.8* 22.8* 19.0* 18.4* 15.6* 13.5*  NEUTROABS 20.5*  --  16.2* 14.2* 11.9* PENDING  HGB 13.2 13.0 11.8* 11.8* 12.0* 11.4*  HCT 42.1 42.2 37.6* 36.7* 38.8* 35.4*  MCV 64.1* 63.3* 63.1* 63.7* 64.8* 61.5*  PLT 382 366 350 343 389 263   Basic Metabolic Panel: Recent Labs  Lab 06/13/18 0109 06/13/18 0426 06/13/18 1842 06/14/18 1032 06/15/18 1300 06/16/18 0325  NA 136 137 132* 137 138 136  K 3.7 3.8 3.9 4.4 3.9 4.0  CL 100 101 100 104 100 101  CO2 25 23 21* 21* 23 22  GLUCOSE 105* 87 94 77 102* 93  BUN 25* 25* 33* 41* 51* 32*  CREATININE 6.20* 6.47* 7.50* 8.66* 10.73* 8.43*  CALCIUM 9.4 9.4 8.7* 9.2 9.1 9.2  MG 1.9 1.8 1.8 1.9  --   --   PHOS  --  3.8  --  5.6* 4.7* 4.7*   GFR: Estimated Creatinine Clearance: 7.3 mL/min (A) (by C-G formula based on SCr of 8.43 mg/dL (H)). Liver Function Tests: Recent Labs  Lab 06/12/18 1141 06/13/18 0109 06/13/18 0426 06/14/18 1032 06/15/18 1300 06/16/18 0325  AST 20 18  --   --   --   --   ALT <5 7  --   --   --   --   ALKPHOS 79 70  --    --   --   --   BILITOT 0.6 0.8  --   --   --   --   PROT 8.1 7.2  --   --   --   --   ALBUMIN 2.8* 2.2* 2.1* 1.9* 2.0* 1.9*   No results for input(s): LIPASE, AMYLASE in the last 168 hours. No results for input(s): AMMONIA in the last 168 hours. Coagulation Profile: No results for input(s): INR, PROTIME in the last 168 hours. Cardiac Enzymes: No results for input(s): CKTOTAL, CKMB, CKMBINDEX, TROPONINI in the last 168 hours. BNP (last 3 results) No results for input(s): PROBNP in the last 8760 hours. HbA1C: No results for input(s): HGBA1C in the last 72 hours. CBG: Recent Labs  Lab 06/13/18 1039  GLUCAP 74   Lipid Profile: No results for input(s): CHOL, HDL, LDLCALC, TRIG, CHOLHDL, LDLDIRECT in the last 72 hours. Thyroid Function Tests: No results for input(s): TSH, T4TOTAL, FREET4, T3FREE, THYROIDAB in the last 72 hours. Anemia Panel: No results for input(s): VITAMINB12, FOLATE, FERRITIN, TIBC, IRON, RETICCTPCT in the last 72 hours. Sepsis Labs: Recent Labs  Lab  06/12/18 1142 06/12/18 1438 06/13/18 0109  LATICACIDVEN 3.3* 2.7* 1.6    Recent Results (from the past 240 hour(s))  MRSA PCR Screening     Status: Abnormal   Collection Time: 06/09/18  2:49 PM  Result Value Ref Range Status   MRSA by PCR POSITIVE (A) NEGATIVE Final    Comment:        The GeneXpert MRSA Assay (FDA approved for NASAL specimens only), is one component of a comprehensive MRSA colonization surveillance program. It is not intended to diagnose MRSA infection nor to guide or monitor treatment for MRSA infections. RESULT CALLED TO, READ BACK BY AND VERIFIED WITH: Mohammed Kindle RN 17:30 06/09/18 ([email or web address]) Performed at [facility], [place] [address], [place], [place] [address]   Blood Culture (routine x 2)     Status: None (Preliminary result)   Collection Time: 06/12/18 11:41 AM  Result Value Ref Range Status   Specimen Description BLOOD LEFT ARM DRAWN BY RN  Final   Special Requests   Final     BOTTLES DRAWN AEROBIC AND ANAEROBIC Blood Culture adequate volume   Culture   Final    NO GROWTH 4 DAYS Performed at [facility], [address] [place], [place] [address]    Report Status PENDING  Incomplete  Blood Culture (routine x 2)     Status: None (Preliminary result)   Collection Time: 06/12/18 11:46 AM  Result Value Ref Range Status   Specimen Description LEFT ANTECUBITAL DRAWN BY RN  Final   Special Requests   Final    BOTTLES DRAWN AEROBIC AND ANAEROBIC Blood Culture adequate volume   Culture   Final    NO GROWTH 4 DAYS Performed at [facility], [address], [place], [place] [address]    Report Status PENDING  Incomplete         Radiology Studies: Dg Foot 2 Views Right  Result Date: 06/15/2018 CLINICAL DATA:  Open wound at amputation site. EXAM: RIGHT FOOT - 2 VIEW COMPARISON:  03/05/2012 FINDINGS: Patient is status post transmetatarsal amputation of the digits with ankylosed appearance between the second and fourth metatarsal shafts. Tapering of the fifth metatarsal shaft is noted with faint irregularity of the tip. Similarly slightly frayed appearance involving the distal amputation margin of the first metatarsal is noted. Findings CT excluded osteomyelitis. Thickening of the amputation stump with soft tissue ulceration is suggested along the medial aspect of the forefoot. Enthesophytes are noted along plantar dorsal aspect of the calcaneus. The ankle and subtalar joints are maintained as are the midfoot articulations. Vascular calcifications crossing the ankle joint are noted keeping diabetes. IMPRESSION: Status post transmetatarsal amputation of the first through fifth rays with ankylosed appearance between the second and fourth metatarsal shafts. A slightly frayed appearance involving the amputation margin of the first and fifth metatarsals are noted. Findings can not exclude osteomyelitis. Inflammatory thickening with subtle soft tissue ulceration/emphysema is noted  along the plantar aspect of the amputation margin. Electronically Signed   By: Ashley Royalty M.D.   On: 06/15/2018 23:07        Scheduled Meds: . Chlorhexidine Gluconate Cloth  6 each Topical Q0600  . heparin  5,000 Units Subcutaneous Q8H   Continuous Infusions: . piperacillin-tazobactam (ZOSYN)  IV 3.375 g (06/16/18 0238)  . vancomycin 750 mg (06/15/18 1857)     LOS: 4 days        Aline August, MD [facility] Pager [phone]  If 7PM-7AM, please contact night-coverage [email or web address] Password [email or web address]  06/16/2018, 9:58 AM

## 2018-06-16 NOTE — Clinical Social Work Note (Signed)
Clinical Social Work Assessment  Patient Details  Name: Joseph MEECH Sr. MRN: [id number] Date of Birth: 21-Oct-1940  Date of referral:  06/16/18               Reason for consult:  Facility Placement                Permission sought to share information with:  Facility Sport and exercise psychologist, Family Supports Permission granted to share information::  Yes, Verbal Permission Granted  Name::        Agency::  SNFs  Relationship::     Contact Information:     Housing/Transportation Living arrangements for the past 2 months:  Apartment Source of Information:  Patient Patient Interpreter Needed:  None Criminal Activity/Legal Involvement Pertinent to Current Situation/Hospitalization:  No - Comment as needed Significant Relationships:  Adult Children Lives with:  Self Do you feel safe going back to the place where you live?  Yes Need for family participation in patient care:  No (Coment)  Care giving concerns:  CSW received consult for possible SNF placement at time of discharge. CSW spoke with patient regarding PT recommendation of SNF placement at time of discharge. Patient reported that he lives alone and his family is currently unable to care for patient at their home given patient's current physical needs and fall risk. Patient expressed understanding of PT recommendation and is agreeable to SNF placement at time of discharge. CSW to continue to follow and assist with discharge planning needs.   Social Worker assessment / plan:  CSW spoke with patient concerning possibility of rehab at [facility] before returning home.  Employment status:  Retired Forensic scientist:  Medicare PT Recommendations:  [place] / Referral to community resources:  [place]  Patient/Family's Response to care:  Patient recognizes need for rehab before returning home and is agreeable to a SNF in [place]. Patient reported preference for [facility] since he has  been there before and they can transport him to dialysis. He requests just a few weeks of rehab until his hand feels better so he can use his cane.  Patient/Family's Understanding of and Emotional Response to Diagnosis, Current Treatment, and Prognosis:  Patient/family is realistic regarding therapy needs and expressed being hopeful for SNF placement. Patient expressed understanding of CSW role and discharge process as well as medical condition. No questions/concerns about plan or treatment.    Emotional Assessment Appearance:  Appears stated age Attitude/Demeanor/Rapport:  Gracious Affect (typically observed):  Accepting, Appropriate, Pleasant Orientation:  Oriented to Self, Oriented to Place, Oriented to  Time, Oriented to Situation Alcohol / Substance use:  Not Applicable Psych involvement (Current and /or in the community):  No (Comment)  Discharge Needs  Concerns to be addressed:  Care Coordination Readmission within the last 30 days:  Yes Current discharge risk:  Dependent with Mobility Barriers to Discharge:  Continued Medical Work up   [employer], LCSW 06/16/2018, 10:27 AM

## 2018-06-16 NOTE — Progress Notes (Signed)
Physical Therapy Treatment Patient Details Name: Joseph KLAS Sr. MRN: [id number] DOB: [birth year] Today's Date: 06/16/2018    History of Present Illness Pt is a 78 y.o. male who presented with severe sepsis, s/p R long finger amputation. PMH: R transmetatarsal amputation, ESRD on dialysis, CVA, aortic stenosis, HTN, anxiety, cardiomyopathy, shoulder surgery.    PT Comments    General improvement with stability using cane.  Pt still guarded, but uses the cane safely.   Follow Up Recommendations  SNF     Equipment Recommendations  Other (comment)    Recommendations for Other Services       Precautions / Restrictions Precautions Precautions: Fall Restrictions RUE Weight Bearing: Non weight bearing    Mobility  Bed Mobility Overal bed mobility: Needs Assistance Bed Mobility: Supine to Sit;Sit to Supine     Supine to sit: Supervision;HOB elevated Sit to supine: Supervision;HOB elevated(mildly elevated.)      Transfers Overall transfer level: Needs assistance Equipment used: Straight cane Transfers: Sit to/from Stand Sit to Stand: Min guard            Ambulation/Gait Ambulation/Gait assistance: Min Web designer (Feet): 300 Feet Assistive device: Straight cane Gait Pattern/deviations: Step-through pattern Gait velocity: moderate   General Gait Details: safe use of the cane.  Pt likely more steady with shoes given transmet amp on the right.   Stairs             Wheelchair Mobility    Modified Rankin (Stroke Patients Only)       Balance Overall balance assessment: Needs assistance   Sitting balance-Leahy Scale: Good       Standing balance-Leahy Scale: Fair Standing balance comment: needing AD or external support for dynamic tasks.                            Cognition Arousal/Alertness: Awake/alert Behavior During Therapy: WFL for tasks assessed/performed Overall Cognitive Status: Within Functional Limits for tasks  assessed                                        Exercises      General Comments        Pertinent Vitals/Pain Pain Assessment: Faces Faces Pain Scale: Hurts even more Pain Location: R hand Pain Descriptors / Indicators: Operative site guarding Pain Intervention(s): Monitored during session    Home Living                      Prior Function            PT Goals (current goals can now be found in the care plan section) Acute Rehab PT Goals Patient Stated Goal: get better PT Goal Formulation: With patient Time For Goal Achievement: 06/29/18 Potential to Achieve Goals: Good Progress towards PT goals: Progressing toward goals    Frequency    Min 3X/week      PT Plan Current plan remains appropriate    Co-evaluation              AM-PAC PT "6 Clicks" Mobility   Outcome Measure  Help needed turning from your back to your side while in a flat bed without using bedrails?: A Little Help needed moving from lying on your back to sitting on the side of a flat bed without using bedrails?: A Little Help needed moving to  and from a bed to a chair (including a wheelchair)?: A Little Help needed standing up from a chair using your arms (e.g., wheelchair or bedside chair)?: A Little Help needed to walk in hospital room?: A Little Help needed climbing 3-5 steps with a railing? : A Little 6 Click Score: 18    End of Session   Activity Tolerance: Patient tolerated treatment well Patient left: in chair;with call bell/phone within reach Nurse Communication: Mobility status PT Visit Diagnosis: Unsteadiness on feet (R26.81)     Time: 9704-4925 PT Time Calculation (min) (ACUTE ONLY): 19 min  Charges:  $Gait Training: 8-22 mins                     06/16/2018  [name], [name] [phone]  (pager) [phone]  (office)   [name] 06/16/2018, 5:33 PM

## 2018-06-16 NOTE — Progress Notes (Signed)
S: hand some sore.   O:Blood pressure (!) 108/59, pulse 79, temperature 98.2 F (36.8 C), temperature source Oral, resp. rate 18, height 5\' 5"  (1.651 m), weight 78 kg, SpO2 95 %.  Dressing changed, hand still swollen, wound is closing up, squeezed from prox to distal on volar and dorsal side, some retained fluid  A:s/p I&D /amputation RLF   P:cont irrigating wound, still may require additional debridement, I&D in future

## 2018-06-16 NOTE — Consult Note (Signed)
ORTHOPAEDIC CONSULTATION  REQUESTING PHYSICIAN: Aline August, MD  Chief Complaint: Right transmetatarsal amputation rule out osteomyelitis.  HPI: [name]. is a 78 y.o. 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  Past Medical History:  Diagnosis Date  . Anxiety   . Aortic stenosis   . Arthritis   . CVA (cerebral infarction)   . ESRD (end stage renal disease) on dialysis [facility])    M/W/F at [facility] in [place]  . Essential hypertension    resolved with HD  . Gangrene of right foot ([place])   . Gastric ulcer 2004  . History of cardiomyopathy    LVEF normal as of February 2017  . History of stroke    Left side weakness  . Iron deficiency anemia    Past Surgical History:  Procedure Laterality Date  . ABDOMINAL AORTAGRAM N/A 01/24/2012   Procedure: ABDOMINAL Maxcine Ham;  Surgeon: Elam Dutch, MD;  Location: [facility] CATH LAB;  Service: Cardiovascular;  Laterality: N/A;  . ABDOMINAL AORTOGRAM W/LOWER EXTREMITY N/A 04/15/2017   Procedure: ABDOMINAL AORTOGRAM W/LOWER EXTREMITY;  Surgeon: Angelia Mould, MD;  Location: [place] CV LAB;  Service: Cardiovascular;  Laterality: N/A;  . AMPUTATION Right 05/09/2017   Procedure: RIGHT TRANSMETATARSAL AMPUTATION;  Surgeon: [name], MD;  Location: [place];  Service: Orthopedics;  Laterality: Right;  . AMPUTATION Right 06/13/2018   Procedure: AMPUTATION RIGHT LONG FINGER;  Surgeon: Dayna Barker, MD;  Location: [place];  Service: Plastics;  Laterality: Right;  . ARTERIOVENOUS GRAFT PLACEMENT Right right arm  . AV FISTULA PLACEMENT Left 08/31/2015   Procedure: ARTERIOVENOUS (AV) FISTULA CREATION- LEFT RADIOCEPHALIC;  Surgeon: Mal Misty, MD;  Location: [place];  Service: Vascular;  Laterality: Left;  . AV FISTULA  PLACEMENT Right 02/18/2017   Procedure: INSERTION OF ARTERIOVENOUS (AV) GORE-TEX GRAFT  RIGHT UPPER ARM;  Surgeon: Conrad [place]irie, MD;  Location: [place];  Service: Vascular;  Laterality: Right;  . BASCILIC VEIN TRANSPOSITION Left 12/19/2015   Procedure: FIRST STAGE BRACHIAL VEIN TRANSPOSITION;  Surgeon: Conrad [place], MD;  Location: [place];  Service: Vascular;  Laterality: Left;  . BASCILIC VEIN TRANSPOSITION Left 02/08/2016   Procedure: SECOND STAGE BRACHIAL VEIN TRANSPOSITION;  Surgeon: Conrad Crenshaw, MD;  Location: [place];  Service: Vascular;  Laterality: Left;  . CARDIAC CATHETERIZATION N/A 07/11/2015   Procedure: Left Heart Cath and Coronary Angiography;  Surgeon: Troy Sine, MD;  Location: [place] CV LAB;  Service: Cardiovascular;  Laterality: N/A;  . Carpel Tunnel Left Dec. 22, 2016  . CHOLECYSTECTOMY    . COLONOSCOPY  2004   Dr. Irving Shows, left sided diverticula and cecal polyp, path unknown  . COLONOSCOPY  10/29/2011   Procedure: COLONOSCOPY;  Surgeon: Daneil Dolin, MD;  [facility];  Service: Endoscopy;  Laterality: N/A;  10:15  . ESOPHAGOGASTRODUODENOSCOPY  11/2002   Dr. Gala [place], erosive reflux esophagitis, multiple gastric ulcer and antral/bulbar erosions. Serologies positive for H.Pylori and was treated  . ESOPHAGOGASTRODUODENOSCOPY  11/20014   Dr. Gala [place], small hh only, ulcers healed  . ESOPHAGOGASTRODUODENOSCOPY  09/21/2011   Dr Trevor Iha [facility], antral erosions, ?early GAVE  . FISTULOGRAM Left 12/10/2016   Procedure: THROMBECTOMY OF LEFT ARM ARTERIOVENOUS FISTULA;  Surgeon: Waynetta Sandy, MD;  Location: [place];  Service: Vascular;  Laterality:  Left;  . INSERTION OF DIALYSIS CATHETER Left 12/10/2016   Procedure: INSERTION OF TUNNELED DIALYSIS CATHETER;  Surgeon: Waynetta Sandy, MD;  Location: [place];  Service: Vascular;  Laterality: Left;  . INSERTION OF DIALYSIS CATHETER Right 06/09/2018   Procedure: INSERTION OF DIALYSIS CATHETER Right subclavian;   Surgeon: Angelia Mould, MD;  Location: [place];  Service: Vascular;  Laterality: Right;  . IR DIALY SHUNT INTRO [place] W/IMG RIGHT Right 01/01/2018  . IR GENERIC HISTORICAL  07/16/2016   IR REMOVAL TUN CV CATH W/O FL 07/16/2016 Saverio Danker, PA-C [facility]-INTERV RAD  . IR PTA ADDL CENTRAL DIALYSIS SEG THRU DIALY CIRCUIT RIGHT Right 10/21/2017  . IR REMOVAL TUN CV CATH W/O FL  05/12/2017  . IR THROMBECTOMY AV FISTULA W/THROMBOLYSIS/PTA INC/SHUNT/IMG RIGHT Right 10/21/2017  . IR THROMBECTOMY AV FISTULA W/THROMBOLYSIS/PTA INC/SHUNT/IMG RIGHT Right 11/27/2017  . IR US GUIDE VASC ACCESS RIGHT  10/21/2017  . IR US GUIDE VASC ACCESS RIGHT  11/27/2017  . IR US GUIDE VASC ACCESS RIGHT  01/01/2018  . LIGATION ARTERIOVENOUS GORTEX GRAFT Right 06/09/2018  . LIGATION ARTERIOVENOUS GORTEX GRAFT Right 06/09/2018   Procedure: LIGATION ARTERIOVENOUS GORTEX GRAFT RIGHT ARM;  Surgeon: Angelia Mould, MD;  Location: [place];  Service: Vascular;  Laterality: Right;  . LIGATION OF ARTERIOVENOUS  FISTULA Left 12/19/2015   Procedure: LIGATION OF RADIOCEPHALIC ARTERIOVENOUS  FISTULA;  Surgeon: Conrad Alpine, MD;  Location: [place];  Service: Vascular;  Laterality: Left;  Marland Kitchen MASS EXCISION Right 02/18/2017   Procedure: EXCISION OF RIGHT AXILLARY EPIDERMAL INCLUSION CYST;  Surgeon: Conrad Lyman, MD;  Location: [place];  Service: Vascular;  Laterality: Right;  . PERIPHERAL VASCULAR CATHETERIZATION N/A 12/14/2015   Procedure: Fistulagram;  Surgeon: Conrad Prospect, MD;  Location: [place] CV LAB;  Service: Cardiovascular;  Laterality: N/A;  . SHOULDER SURGERY    . ULTRASOUND GUIDANCE FOR VASCULAR ACCESS  04/15/2017   Procedure: Ultrasound Guidance For Vascular Access;  Surgeon: Angelia Mould, MD;  Location: [place] CV LAB;  Service: Cardiovascular;;  . UPPER EXTREMITY VENOGRAPHY Bilateral 12/17/2016   Procedure: Bilateral Upper Extremity Venography;  Surgeon: Serafina Mitchell, MD;  Location: [place] CV  LAB;  Service: Cardiovascular;  Laterality: Bilateral;   Social History   Socioeconomic History  . Marital status: Single    Spouse name: Not on file  . Number of children: 2  . Years of education: Not on file  . Highest education level: Not on file  Occupational History  . Occupation: retired, Licensed conveyancer    Employer: RETIRED  Social Needs  . Financial resource strain: Not on file  . Food insecurity:    Worry: Not on file    Inability: Not on file  . Transportation needs:    Medical: Not on file    Non-medical: Not on file  Tobacco Use  . Smoking status: Former Smoker    Last attempt to quit: 04/29/2004    Years since quitting: 14.1  . Smokeless tobacco: Former Systems developer    Types: Chew    Quit date: 01/16/1987  . Tobacco comment: quit 2006  Substance and Sexual Activity  . Alcohol use: No  . Drug use: No  . Sexual activity: Not on file  Lifestyle  . Physical activity:    Days per week: Not on file    Minutes per session: Not on file  . Stress: Not on file  Relationships  . Social connections:    Talks on phone:  Not on file    Gets together: Not on file    Attends religious service: Not on file    Active member of club or organization: Not on file    Attends meetings of clubs or organizations: Not on file    Relationship status: Not on file  Other Topics Concern  . Not on file  Social History Narrative   Lives alone   Daughter 20-min away   Caffeine use: 32oz soda per day   Family History  Problem Relation Age of Onset  . Hypertension Mother   . Colon cancer Neg Hx   . Liver disease Neg Hx    - negative except otherwise stated in the family history section Allergies  Allergen Reactions  . Aspirin Other (See Comments)    INTERNAL BLEEDING Causes internal bleeding  Other Reaction: history of ulcers   Prior to Admission medications   Medication Sig Start Date End Date Taking? Authorizing Provider  atorvastatin (LIPITOR) 40 MG tablet Take 40 mg by mouth daily.  11/03/15  Yes [provider]  b complex-vitamin c-folic acid (NEPHRO-VITE) 0.8 MG TABS Take 0.8 mg by mouth See admin instructions. Takes on Tuesdays, Thursdays, Saturdays, and Sundays. Does not take on Mondays, Wednesdays, and Fridays due to Dialysis treatments.   Yes [provider]  cinacalcet (SENSIPAR) 60 MG tablet Take 60 mg by mouth every evening. With evening meal   Yes [provider]  HYDROcodone-acetaminophen (NORCO/VICODIN) 5-325 MG tablet Take 1 tablet by mouth every 4 (four) hours as needed for moderate pain. 05/09/17  Yes [name], MD  ibuprofen (ADVIL,MOTRIN) 200 MG tablet Take 600 mg by mouth every 6 (six) hours as needed for mild pain.   Yes [provider]  oxyCODONE-acetaminophen (PERCOCET/ROXICET) 5-325 MG tablet Take 1 tablet by mouth every 6 (six) hours as needed for severe pain. 05/13/17  Yes [name], MD  Potassium (POTASSIMIN PO) Take 1 tablet by mouth daily.   Yes [provider]  sevelamer (RENVELA) 800 MG tablet Take 1,600-3,200 mg by mouth See admin instructions. Take 3200  mg by mouth 3 times daily with full meals and take 1600 mg by mouth with snacks.   Yes [provider]  traMADol (ULTRAM) 50 MG tablet Take 50 mg by mouth 3 (three) times daily as needed for moderate pain.   Yes [provider]  nitroGLYCERIN (NITRODUR - DOSED IN MG/24 HR) 0.2 mg/hr patch Place 1 patch (0.2 mg total) onto the skin daily. Apply 1 patch to dorsum of foot daily. Patient not taking: Reported on 06/12/2018 05/05/18   Rayburn, Neta Mends, PA-C  ondansetron (ZOFRAN) 8 MG tablet Take 1 tablet (8 mg total) by mouth every 8 (eight) hours as needed for nausea or vomiting. Patient not taking: Reported on 06/09/2018 03/24/17   Daleen Bo, MD  pentoxifylline (TRENTAL) 400 MG CR tablet Take 1 tablet (400 mg total) by mouth 3 (three) times daily with meals. Patient not taking: Reported on 06/09/2018 05/05/18   Rayburn, Neta Mends, PA-C   Dg Foot 2 Views Right  Result Date: 06/15/2018 CLINICAL DATA:  Open wound at amputation site. EXAM: RIGHT FOOT - 2 VIEW COMPARISON:  03/05/2012 FINDINGS: Patient is status post transmetatarsal amputation of the digits with ankylosed appearance between the second and fourth metatarsal shafts. Tapering of the fifth metatarsal shaft is noted with faint irregularity of the tip. Similarly slightly frayed appearance involving the distal amputation margin of the first metatarsal is noted. Findings  CT excluded osteomyelitis. Thickening of the amputation stump with soft tissue ulceration is suggested along the medial aspect of the forefoot. Enthesophytes are noted along plantar dorsal aspect of the calcaneus. The ankle and subtalar joints are maintained as are the midfoot articulations. Vascular calcifications crossing the ankle joint are noted keeping diabetes. IMPRESSION: Status post transmetatarsal amputation of the first through fifth rays with ankylosed appearance between the second and fourth metatarsal shafts. A slightly frayed appearance involving the amputation margin of the first and fifth metatarsals are noted. Findings can not exclude osteomyelitis. Inflammatory thickening with subtle soft tissue ulceration/emphysema is noted along the plantar aspect of the amputation margin. Electronically Signed   By: Ashley Royalty M.D.   On: 06/15/2018 23:07   - pertinent xrays, CT, MRI studies were reviewed and independently interpreted  Positive ROS: All other systems have been reviewed and were otherwise negative with the exception of those mentioned in the HPI and as above.  Physical Exam: General: Alert, no acute distress Psychiatric: Patient is competent for consent with normal mood and affect Lymphatic: No axillary or cervical lymphadenopathy Cardiovascular: No pedal edema Respiratory: No cyanosis, no use of accessory musculature GI: No organomegaly, abdomen is soft and  non-tender    Images:  @ENCIMAGES @  Labs:  Lab Results  Component Value Date   REPTSTATUS PENDING 06/12/2018   GRAMSTAIN  02/18/2017    RARE WBC PRESENT, PREDOMINANTLY PMN FEW GRAM POSITIVE COCCI    CULT  06/12/2018    NO GROWTH 4 DAYS Performed at [facility], [address], [place], [place] [address]     Lab Results  Component Value Date   ALBUMIN 1.9 (L) 06/16/2018   ALBUMIN 2.0 (L) 06/15/2018   ALBUMIN 1.9 (L) 06/14/2018    Neurologic: Patient does not have protective sensation bilateral lower extremities.   MUSCULOSKELETAL:   Skin: Examination there is no redness no cellulitis there are some ischemic changes to the foot.  No purulent drainage no ascending cellulitis.  Patient does have a palpable pulse.  He has severe protein caloric malnutrition with albumin of 1.9. No abscess no open ulcer no pain to palpation  Review of the radiographs shows calcification of the vessels out to the transmetatarsal amputation.  There is a suggestion of possible osteomyelitis of metatarsals 1 and 5.    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  Plan: I will continue to follow with the foot in the office.  No surgical intervention at this time.  Thank you for the consult and the opportunity to see [name], [place] [phone] 8:37 AM

## 2018-06-17 DIAGNOSIS — L02511 Cutaneous abscess of right hand: Secondary | ICD-10-CM

## 2018-06-17 DIAGNOSIS — L0390 Cellulitis, unspecified: Secondary | ICD-10-CM

## 2018-06-17 LAB — CULTURE, BLOOD (ROUTINE X 2)
Culture: NO GROWTH
Culture: NO GROWTH
Special Requests: ADEQUATE
Special Requests: ADEQUATE

## 2018-06-17 LAB — BASIC METABOLIC PANEL
Anion gap: 13 (ref 5–15)
BUN: 40 mg/dL — ABNORMAL HIGH (ref 8–23)
CALCIUM: 9.4 mg/dL (ref 8.9–10.3)
CO2: 21 mmol/L — AB (ref 22–32)
Chloride: 102 mmol/L (ref 98–111)
Creatinine, Ser: 10.06 mg/dL — ABNORMAL HIGH (ref 0.61–1.24)
GFR calc Af Amer: 5 mL/min — ABNORMAL LOW (ref >60)
GFR calc non Af Amer: 4 mL/min — ABNORMAL LOW (ref >60)
Glucose, Bld: 84 mg/dL (ref 70–99)
Potassium: 4.6 mmol/L (ref 3.5–5.1)
Sodium: 136 mmol/L (ref 135–145)

## 2018-06-17 LAB — CBC WITH DIFFERENTIAL/PLATELET
Abs Immature Granulocytes: 0 10*3/uL (ref 0.00–0.07)
Basophils Absolute: 0.2 10*3/uL — ABNORMAL HIGH (ref 0.0–0.1)
Basophils Relative: 2 %
Eosinophils Absolute: 0.5 10*3/uL (ref 0.0–0.5)
Eosinophils Relative: 4 %
HCT: 36.2 % — ABNORMAL LOW (ref 39.0–52.0)
Hemoglobin: 11.6 g/dL — ABNORMAL LOW (ref 13.0–17.0)
Lymphocytes Relative: 17 %
Lymphs Abs: 2 10*3/uL (ref 0.7–4.0)
MCH: 20 pg — ABNORMAL LOW (ref 26.0–34.0)
MCHC: 32 g/dL (ref 30.0–36.0)
MCV: 62.3 fL — ABNORMAL LOW (ref 80.0–100.0)
Monocytes Absolute: 0.7 10*3/uL (ref 0.1–1.0)
Monocytes Relative: 6 %
Neutro Abs: 8.4 10*3/uL — ABNORMAL HIGH (ref 1.7–7.7)
Neutrophils Relative %: 71 %
Platelets: 377 10*3/uL (ref 150–400)
RBC: 5.81 MIL/uL (ref 4.22–5.81)
RDW: 18.7 % — ABNORMAL HIGH (ref 11.5–15.5)
WBC: 11.9 10*3/uL — ABNORMAL HIGH (ref 4.0–10.5)
nRBC: 0 % (ref 0.0–0.2)
nRBC: 0 /100 WBC

## 2018-06-17 LAB — MAGNESIUM: Magnesium: 2 mg/dL (ref 1.7–2.4)

## 2018-06-17 MED ORDER — HEPARIN SODIUM (PORCINE) 1000 UNIT/ML IJ SOLN
INTRAMUSCULAR | Status: AC
  Administered 2018-06-17: 3500 [IU] via INTRAVENOUS_CENTRAL
  Filled 2018-06-17: qty 4

## 2018-06-17 MED ORDER — HEPARIN SODIUM (PORCINE) 1000 UNIT/ML DIALYSIS
3500.0000 [IU] | INTRAMUSCULAR | Status: DC | PRN
  Administered 2018-06-17: 3500 [IU] via INTRAVENOUS_CENTRAL
  Filled 2018-06-17 (×2): qty 4

## 2018-06-17 MED ORDER — ALTEPLASE 2 MG IJ SOLR
INTRAMUSCULAR | Status: AC
  Administered 2018-06-17: 4 mg
  Filled 2018-06-17: qty 4

## 2018-06-17 MED ORDER — RENA-VITE PO TABS
1.0000 | ORAL_TABLET | Freq: Every day | ORAL | Status: DC
  Administered 2018-06-17: 1 via ORAL
  Filled 2018-06-17: qty 1

## 2018-06-17 MED ORDER — VANCOMYCIN HCL IN DEXTROSE 750-5 MG/150ML-% IV SOLN
INTRAVENOUS | Status: AC
  Filled 2018-06-17: qty 150

## 2018-06-17 MED ORDER — NEPRO/CARBSTEADY PO LIQD: 237.0000 mL | Freq: Two times a day (BID) | ORAL | Status: DC

## 2018-06-17 NOTE — Progress Notes (Signed)
PROGRESS NOTE        PATIENT DETAILS Name: Joseph ZINK Sr. Age: 78 y.o. Sex: male Date of Birth: [birth year] Admit Date: 06/12/2018 Admitting Physician Clanford Marisa Hua, MD PYP:[id number], Brandon Melnick, MD  Brief Narrative: Patient is a 78 y.o. 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  Evaluated by hand surgery-underwent amputation of the right long finger on 2/15.  See below for further details  Subjective: No chest pain or shortness of breath.  Lying comfortably in bed.  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  Blood cultures on 2/14 remain negative.  Will discontinue Zosyn and just continue on vancomycin.  Will await further input from hand surgery.    ?  X-ray evidence of osteomyelitis of the metatarsals of the first and fifth tarsal shafts: Seen by Dr. Loma Sender clinical evidence of osteomyelitis at this time.  Dr. Sharol Given will continue to follow foot in the office.  ESRD: On HD MWF-defer further to nephrology.  Left foot plantar surface wound: Continue outpatient follow-up with podiatry/orthopedics.  Recent steal syndrome status post ligation of right AV graft and placement of right subclavian TDC by vascular surgery on 2/11  DVT Prophylaxis: Prophylactic Heparin  Code Status: Full code  Family Communication: Noneat bedside   Disposition Plan: Remain inpatient- SNF on discharge  Antimicrobial agents: Anti-infectives (From admission, onward)   Start     Dose/Rate Route Frequency Ordered Stop   06/17/18 1048  Vancomycin (VANCOCIN) 750-5 MG/150ML-% IVPB    Note to Pharmacy:  Lacie Scotts   : cabinet override      06/17/18 1048 06/17/18 1115   06/15/18 1800  vancomycin (VANCOCIN) IVPB 750  mg/150 ml premix  Status:  Discontinued     750 mg 150 mL/hr over 60 Minutes Intravenous Every M-W-F 06/12/18 1421 06/14/18 1410   06/15/18 1200  vancomycin (VANCOCIN) IVPB 750 mg/150 ml premix     750 mg 150 mL/hr over 60 Minutes Intravenous Every M-W-F (Hemodialysis) 06/14/18 1410     06/14/18 1600  vancomycin (VANCOCIN) IVPB 750 mg/150 ml premix     750 mg 150 mL/hr over 60 Minutes Intravenous  Once 06/14/18 1405 06/14/18 1632   06/12/18 1400  piperacillin-tazobactam (ZOSYN) IVPB 3.375 g     3.375 g 12.5 mL/hr over 240 Minutes Intravenous Every 12 hours 06/12/18 1248     06/12/18 1315  vancomycin (VANCOCIN) IVPB 1000 mg/200 mL premix     1,000 mg 200 mL/hr over 60 Minutes Intravenous  Once 06/12/18 1234 06/12/18 1344      Procedures: 2/15>>right long finger amputation  CONSULTS:  nephrology, orthopedic surgery and vascular surgery  Time spent: 25- minutes-Greater than 50% of this time was spent in counseling, explanation of diagnosis, planning of further management, and coordination of care.  MEDICATIONS: Scheduled Meds: . Chlorhexidine Gluconate Cloth  6 each Topical Q0600  . heparin  5,000 Units Subcutaneous Q8H   Continuous Infusions: . piperacillin-tazobactam (ZOSYN)  IV 3.375 g (06/17/18 1352)  . vancomycin 750 mg (06/17/18 1050)   PRN Meds:.acetaminophen **OR** acetaminophen, [START ON 06/18/2018] heparin, HYDROcodone-acetaminophen, HYDROmorphone (DILAUDID) injection, loperamide, ondansetron **OR** ondansetron (ZOFRAN) IV, senna-docusate  PHYSICAL EXAM: Vital signs: Vitals:   06/17/18 0930 06/17/18 1000 06/17/18 1030 06/17/18 1105  BP: (!) 145/78 (!) 146/67 127/70 (!) 156/69  Pulse: 82 82 79 82  Resp:    18  Temp:    98.1 F (36.7 C)  TempSrc:    Oral  SpO2:    96%  Weight:    79 kg  Height:       Filed Weights   06/17/18 0338 06/17/18 0700 06/17/18 1105  Weight: 77.7 kg 80.6 kg 79 kg   Body mass index is 28.98 kg/m.   General appearance :Awake,  alert, not in any distress.  Eyes:Pink conjunctiva HEENT: Atraumatic and Normocephalic Neck: supple, no JVD.  Resp:Good air entry bilaterally, no added sounds  CVS: S1 S2 regular, no murmurs.  GI: Bowel sounds present, Non tender and not distended with no gaurding, rigidity or rebound.No organomegaly Extremities: B/L Lower Ext shows no edema Neurology:  speech clear,Non focal, sensation is grossly intact. Musculoskeletal:No digital cyanosis Skin:No Rash, warm and dry Wounds:N/A  I have personally reviewed following labs and imaging studies  LABORATORY DATA: CBC: Recent Labs  Lab 06/13/18 1245 06/14/18 1032 06/15/18 0727 06/16/18 0325 06/17/18 0458  WBC 19.0* 18.4* 15.6* 13.5* 11.9*  NEUTROABS 16.2* 14.2* 11.9* 10.0* 8.4*  HGB 11.8* 11.8* 12.0* 11.4* 11.6*  HCT 37.6* 36.7* 38.8* 35.4* 36.2*  MCV 63.1* 63.7* 64.8* 61.5* 62.3*  PLT 350 343 389 387 580    Basic Metabolic Panel: Recent Labs  Lab 06/13/18 0109 06/13/18 0426 06/13/18 1842 06/14/18 1032 06/15/18 1300 06/16/18 0325 06/17/18 0458  NA 136 137 132* 137 138 136 136  K 3.7 3.8 3.9 4.4 3.9 4.0 4.6  CL 100 101 100 104 100 101 102  CO2 25 23 21* 21* 23 22 21*  GLUCOSE 105* 87 94 77 102* 93 84  BUN 25* 25* 33* 41* 51* 32* 40*  CREATININE 6.20* 6.47* 7.50* 8.66* 10.73* 8.43* 10.06*  CALCIUM 9.4 9.4 8.7* 9.2 9.1 9.2 9.4  MG 1.9 1.8 1.8 1.9  --   --  2.0  PHOS  --  3.8  --  5.6* 4.7* 4.7*  --     GFR: Estimated Creatinine Clearance: 6.1 mL/min (A) (by C-G formula based on SCr of 10.06 mg/dL (H)).  Liver Function Tests: Recent Labs  Lab 06/12/18 1141 06/13/18 0109 06/13/18 0426 06/14/18 1032 06/15/18 1300 06/16/18 0325  AST 20 18  --   --   --   --   ALT <5 7  --   --   --   --   ALKPHOS 79 70  --   --   --   --   BILITOT 0.6 0.8  --   --   --   --   PROT 8.1 7.2  --   --   --   --   ALBUMIN 2.8* 2.2* 2.1* 1.9* 2.0* 1.9*   No results for input(s): LIPASE, AMYLASE in the last 168 hours. No results  for input(s): AMMONIA in the last 168 hours.  Coagulation Profile: No results for input(s): INR, PROTIME in the last 168 hours.  Cardiac Enzymes: No results for input(s): CKTOTAL, CKMB, CKMBINDEX, TROPONINI in the last 168 hours.  BNP (last 3 results) No results for input(s): PROBNP in the last 8760 hours.  HbA1C: No results for input(s): HGBA1C in the last 72 hours.  CBG: Recent Labs  Lab 06/13/18 1039  GLUCAP 74    Lipid Profile: No results for input(s):  CHOL, HDL, LDLCALC, TRIG, CHOLHDL, LDLDIRECT in the last 72 hours.  Thyroid Function Tests: No results for input(s): TSH, T4TOTAL, FREET4, T3FREE, THYROIDAB in the last 72 hours.  Anemia Panel: No results for input(s): VITAMINB12, FOLATE, FERRITIN, TIBC, IRON, RETICCTPCT in the last 72 hours.  Urine analysis: No results found for: COLORURINE, APPEARANCEUR, LABSPEC, PHURINE, GLUCOSEU, HGBUR, BILIRUBINUR, KETONESUR, PROTEINUR, UROBILINOGEN, NITRITE, LEUKOCYTESUR  Sepsis Labs: Lactic Acid, Venous    Component Value Date/Time   LATICACIDVEN 1.6 06/13/2018 0109    MICROBIOLOGY: Recent Results (from the past 240 hour(s))  MRSA PCR Screening     Status: Abnormal   Collection Time: 06/09/18  2:49 PM  Result Value Ref Range Status   MRSA by PCR POSITIVE (A) NEGATIVE Final    Comment:        The GeneXpert MRSA Assay (FDA approved for NASAL specimens only), is one component of a comprehensive MRSA colonization surveillance program. It is not intended to diagnose MRSA infection nor to guide or monitor treatment for MRSA infections. RESULT CALLED TO, READ BACK BY AND VERIFIED WITH: Mohammed Kindle RN 17:30 06/09/18 ([email or web address]) Performed at [facility], [place] [address], [place], [place] [address]   Blood Culture (routine x 2)     Status: None   Collection Time: 06/12/18 11:41 AM  Result Value Ref Range Status   Specimen Description BLOOD LEFT ARM DRAWN BY RN  Final   Special Requests   Final    BOTTLES DRAWN AEROBIC  AND ANAEROBIC Blood Culture adequate volume   Culture   Final    NO GROWTH 5 DAYS Performed at [facility], [address], [place], [place] [address]    Report Status 06/17/2018 FINAL  Final  Blood Culture (routine x 2)     Status: None   Collection Time: 06/12/18 11:46 AM  Result Value Ref Range Status   Specimen Description LEFT ANTECUBITAL DRAWN BY RN  Final   Special Requests   Final    BOTTLES DRAWN AEROBIC AND ANAEROBIC Blood Culture adequate volume   Culture   Final    NO GROWTH 5 DAYS Performed at [facility], [address], [place], [place] [address]    Report Status 06/17/2018 FINAL  Final    RADIOLOGY STUDIES/RESULTS: Dg Hand 2 View Right  Result Date: 06/12/2018 CLINICAL DATA:  Acute right hand pain and swelling. EXAM: RIGHT HAND - 2 VIEW COMPARISON:  None. FINDINGS: There is no evidence of fracture or dislocation. There is no evidence of arthropathy or other focal bone abnormality. Vascular calcifications are noted. Dorsal soft tissue swelling is noted which contains gas consistent with cellulitis. Severe enlargement of the third and fourth fingers is noted consistent with infection. Gas is present and gangrene can not be excluded. IMPRESSION: Dorsal soft tissue swelling is noted which contains gas consistent with cellulitis. Severe soft tissue enlargement of the third and fourth fingers is noted as well which contains gas concerning for cellulitis or possibly gangrene. No definite lytic destruction is seen to suggest osteomyelitis. Electronically Signed   By: Marijo Conception, M.D.   On: 06/12/2018 13:56   Dg Chest Port 1 View  Result Date: 06/09/2018 CLINICAL DATA:  78 year old male status post dialysis catheter placement. EXAM: PORTABLE CHEST 1 VIEW COMPARISON:  03/31/2017 and earlier. FINDINGS: Portable AP semi upright view at 0923 hours. Right chest dual lumen dialysis type catheter in place. Catheter tips project at the lower SVC level. Lower lung volumes. Confluent  opacity at the left lung  base similar to the prior study. No pneumothorax, pulmonary edema or definite pleural effusion. Mediastinal contours remain normal. Calcified aortic atherosclerosis. Visualized tracheal air column is within normal limits. Negative visible bowel gas pattern. Chronic right humerus intramedullary rod. IMPRESSION: 1. Right chest dialysis catheter with catheter tips at the lower SVC level. 2. Atelectasis or consolidation at the left lung base similar to 2018 radiographs. No definite pleural effusion. 3.  Aortic Atherosclerosis (ICD10-I70.0). Electronically Signed   By: Genevie Ann M.D.   On: 06/09/2018 09:40   Dg Foot 2 Views Right  Result Date: 06/15/2018 CLINICAL DATA:  Open wound at amputation site. EXAM: RIGHT FOOT - 2 VIEW COMPARISON:  03/05/2012 FINDINGS: Patient is status post transmetatarsal amputation of the digits with ankylosed appearance between the second and fourth metatarsal shafts. Tapering of the fifth metatarsal shaft is noted with faint irregularity of the tip. Similarly slightly frayed appearance involving the distal amputation margin of the first metatarsal is noted. Findings CT excluded osteomyelitis. Thickening of the amputation stump with soft tissue ulceration is suggested along the medial aspect of the forefoot. Enthesophytes are noted along plantar dorsal aspect of the calcaneus. The ankle and subtalar joints are maintained as are the midfoot articulations. Vascular calcifications crossing the ankle joint are noted keeping diabetes. IMPRESSION: Status post transmetatarsal amputation of the first through fifth rays with ankylosed appearance between the second and fourth metatarsal shafts. A slightly frayed appearance involving the amputation margin of the first and fifth metatarsals are noted. Findings can not exclude osteomyelitis. Inflammatory thickening with subtle soft tissue ulceration/emphysema is noted along the plantar aspect of the amputation margin.  Electronically Signed   By: Ashley Royalty M.D.   On: 06/15/2018 23:07   Dg Fluoro Guide Cv Line-no Report  Result Date: 06/09/2018 Fluoroscopy was utilized by the requesting physician.  No radiographic interpretation.     LOS: 5 days   Oren Binet, MD  [facility]  If 7PM-7AM, please contact night-coverage  Please page via [email or web address]  Go to [email or web address] and use [place]'s universal password to access. If you do not have the password, please contact the hospital operator.  Locate the [facility] provider you are looking for under [facility] and page to a number that you can be directly reached. If you still have difficulty reaching the provider, please page the [facility] (Director on Call) for the Hospitalists listed on amion for assistance.  06/17/2018, 2:54 PM

## 2018-06-17 NOTE — Progress Notes (Signed)
Pharmacy Antibiotic Note  [name]. is a 78 y.o. male admitted on 06/12/2018 with gangrene to right long finger .  Pharmacy has been consulted for Vanco/Zosyn dosing.  ID: [id number] vanc and zosyn for gangrene to right long finger s/p urgent amputation 2/15. May need further I&D per ortho note 2/18  Afb, WBC 11.9 down, dose for ESRD  Vanco 2/14 >> Zosyn 2/14 >>   2/14 Blood x 2 - negative 2/11 MRSA PCR: positive   Plan:  Vanc  750 mg IV MWF post-HD. Level Monday if continued  Zosyn 3.375g IV every 12 hours    Height: 5\' 5"  (165.1 cm) Weight: 174 lb 2.6 oz (79 kg) IBW/kg (Calculated) : 61.5  Temp (24hrs), Avg:98.2 F (36.8 C), Min:97.9 F (36.6 C), Max:98.6 F (37 C)  Recent Labs  Lab 06/12/18 1142 06/12/18 1438  06/13/18 0109  06/13/18 1245 06/13/18 1842 06/14/18 1032 06/15/18 0727 06/15/18 1300 06/16/18 0325 06/17/18 0458  WBC  --   --    < >  --   --  19.0*  --  18.4* 15.6*  --  13.5* 11.9*  CREATININE  --   --    < > 6.20*   < >  --  7.50* 8.66*  --  10.73* 8.43* 10.06*  LATICACIDVEN 3.3* 2.7*  --  1.6  --   --   --   --   --   --   --   --    < > = values in this interval not displayed.    Estimated Creatinine Clearance: 6.1 mL/min (A) (by C-G formula based on SCr of 10.06 mg/dL (H)).    Allergies  Allergen Reactions  . Aspirin Other (See Comments)    INTERNAL BLEEDING Causes internal bleeding  Other Reaction: history of ulcers     [name], PharmD, BCPS Clinical Staff Pharmacist [place], [place] 06/17/2018 1:04 PM

## 2018-06-17 NOTE — Progress Notes (Signed)
Initial Nutrition Assessment  DOCUMENTATION CODES:   Non-severe (moderate) malnutrition in context of chronic illness  INTERVENTION:   Nepro Shake po BID, each supplement provides 425 kcal and 19 grams protein  Add Renal MVI   NUTRITION DIAGNOSIS:   Moderate Malnutrition related to chronic illness as evidenced by mild fat depletion, mild muscle depletion, moderate muscle depletion.  GOAL:   Patient will meet greater than or equal to 90% of their needs  MONITOR:   PO intake, Supplement acceptance, Labs, Weight trends  REASON FOR ASSESSMENT:   Consult Assessment of nutrition requirement/status  ASSESSMENT:    78 yo male recently [facility] on [facility]12 after having ligation of right AV graft, placement of TDC, gangrene of right 2nd and 3rd fingers which was treated with antibiotics. [facility] and underwent right long finger amputation. Now with cellulitis and gangrene of fingers of right hand, sepsis. PMH includes ESRD/HD, steal syndrome, PVD, HTN, CAD, CM   EDW 77 kg, current wt 79.3 kg. Pt reports he has lost weight but does not know how much  Recorded po intake 0-50% of meals, 25% on average this admission. Pt reports appetite is good. Denies using oral nutrition supplements. Pt reports he typically eats 2 meals per day, even on HD days, and snacks all day. Pt typically eats out, does not cook much on the weekends. Pt reports he typically eats biscuit of some sort in AM and then a normal "big meal" either after HD or in the evening. Pt snacks on all sorts of things, cookies, bugles etc.    Labs: phosphorus 4.7 (acceptable for HD) Meds: reviewed. Pt on pain regimen and bowel regimen    Noted home meds include Nephro-Vite, Sensipar 60 mg, Potassium, Renvela 3200 mg with meals, 1600 mg with snacks.     NUTRITION - FOCUSED PHYSICAL EXAM:    Most Recent Value  Orbital Region  Mild depletion  Upper Arm Region   Unable to assess  Thoracic and Lumbar Region  Mild depletion  Buccal Region  Moderate depletion  Temple Region  Moderate depletion  Clavicle Bone Region  Mild depletion  Clavicle and Acromion Bone Region  Mild depletion  Scapular Bone Region  Moderate depletion  Dorsal Hand  Unable to assess  Patellar Region  Mild depletion  Anterior Thigh Region  Moderate depletion  Posterior Calf Region  Mild depletion  Edema (RD Assessment)  None  Hair  Reviewed  Eyes  Reviewed  Mouth  Reviewed  Skin  Reviewed  Nails  Reviewed       Diet Order:   Diet Order            Diet renal/carb modified with fluid restriction Diet-HS Snack? Nothing; Fluid restriction: 1200 mL Fluid; Room service appropriate? Yes; Fluid consistency: Thin  Diet effective now              EDUCATION NEEDS:   Education needs have been addressed  Skin:  Skin Assessment: Skin Integrity Issues: Skin Integrity Issues:: Diabetic Ulcer Diabetic Ulcer: heel  Last BM:  2/17  Height:   Ht Readings from Last 1 Encounters:  06/12/18 5\' 5"  (1.651 m)    Weight:   Wt Readings from Last 1 Encounters:  06/17/18 79 kg    BMI:  Body mass index is 28.98 kg/m.  Estimated Nutritional Needs:   Kcal:  2000-2200 kcals   Protein:  100-115 g  Fluid:  1000 mL plus UOP  Kerman Passey MS, RD, [place], [place] [phone] Pager  [phone] Weekend/On-Call Pager

## 2018-06-17 NOTE — Progress Notes (Signed)
[facility] Progress Note  Subjective: no c/o today, on HD, TDC not working great 200 ml/min  Vitals:   06/17/18 0930 06/17/18 1000 06/17/18 1030 06/17/18 1105  BP: (!) 145/78 (!) 146/67 127/70 (!) 156/69  Pulse: 82 82 79 82  Resp:    18  Temp:    98.1 F (36.7 C)  TempSrc:    Oral  SpO2:    96%  Weight:    79 kg  Height:        Inpatient medications: . Chlorhexidine Gluconate Cloth  6 each Topical Q0600  . heparin  5,000 Units Subcutaneous Q8H   . piperacillin-tazobactam (ZOSYN)  IV Stopped (06/17/18 1142)  . vancomycin 750 mg (06/17/18 1050)   acetaminophen **OR** acetaminophen, [START ON 06/18/2018] heparin, HYDROcodone-acetaminophen, HYDROmorphone (DILAUDID) injection, loperamide, ondansetron **OR** ondansetron (ZOFRAN) IV, senna-docusate  Iron/TIBC/Ferritin/ %Sat No results found for: IRON, TIBC, FERRITIN, IRONPCTSAT  Exam: Gen alert, no distress No jvd or bruits Chest clear bilat RRR no MRG Abd soft ntnd no mass or ascites +bs Ext R 2nd finger amputation wrapped, no LE edema, 2+ RUE edema Neuro is alert, Ox 3 , nf   Home meds:  - atorvastatin 40/ nitroglycerin patch 0.2mg  qd/ pentoxifylline  - cinacalcet 60 hs/ sevelamer 2-3 ac tid/ potassium po qd  - oxycodone-acetaminophen 5-325 qid prn/ tramadol 50mg  tid prn/ prn ondansetron 8mg  tid/ ibuprofen prn  - vitmains  Dialysis: [facility] MWF  4h  77kg   Heparin 1000 then 2000/hr   R IJ TDC (R AVG sp ligation)   CXR 2/11 no edema  Assessment/ Plan: 1. 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  2. Hypertension: BP's stable 3. ESRD on HD MWF. HD today, will TPA cath post HD and remove in am.  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

## 2018-06-18 ENCOUNTER — Ambulatory Visit (INDEPENDENT_AMBULATORY_CARE_PROVIDER_SITE_OTHER): Payer: Medicare Other | Admitting: Orthopedic Surgery

## 2018-06-18 DIAGNOSIS — T82898A Other specified complication of vascular prosthetic devices, implants and grafts, initial encounter: Secondary | ICD-10-CM | POA: Diagnosis not present

## 2018-06-18 DIAGNOSIS — D509 Iron deficiency anemia, unspecified: Secondary | ICD-10-CM | POA: Diagnosis not present

## 2018-06-18 DIAGNOSIS — Z4781 Encounter for orthopedic aftercare following surgical amputation: Secondary | ICD-10-CM | POA: Diagnosis not present

## 2018-06-18 DIAGNOSIS — Z7401 Bed confinement status: Secondary | ICD-10-CM | POA: Diagnosis not present

## 2018-06-18 DIAGNOSIS — Z89431 Acquired absence of right foot: Secondary | ICD-10-CM | POA: Diagnosis not present

## 2018-06-18 DIAGNOSIS — I429 Cardiomyopathy, unspecified: Secondary | ICD-10-CM | POA: Diagnosis not present

## 2018-06-18 DIAGNOSIS — M2550 Pain in unspecified joint: Secondary | ICD-10-CM | POA: Diagnosis not present

## 2018-06-18 DIAGNOSIS — M6281 Muscle weakness (generalized): Secondary | ICD-10-CM | POA: Diagnosis not present

## 2018-06-18 DIAGNOSIS — I120 Hypertensive chronic kidney disease with stage 5 chronic kidney disease or end stage renal disease: Secondary | ICD-10-CM | POA: Diagnosis not present

## 2018-06-18 DIAGNOSIS — T8249XA Other complication of vascular dialysis catheter, initial encounter: Secondary | ICD-10-CM | POA: Diagnosis not present

## 2018-06-18 DIAGNOSIS — N184 Chronic kidney disease, stage 4 (severe): Secondary | ICD-10-CM | POA: Diagnosis not present

## 2018-06-18 DIAGNOSIS — A4189 Other specified sepsis: Secondary | ICD-10-CM | POA: Diagnosis not present

## 2018-06-18 DIAGNOSIS — Z992 Dependence on renal dialysis: Secondary | ICD-10-CM | POA: Diagnosis not present

## 2018-06-18 DIAGNOSIS — R2689 Other abnormalities of gait and mobility: Secondary | ICD-10-CM | POA: Diagnosis not present

## 2018-06-18 DIAGNOSIS — E43 Unspecified severe protein-calorie malnutrition: Secondary | ICD-10-CM

## 2018-06-18 DIAGNOSIS — E611 Iron deficiency: Secondary | ICD-10-CM | POA: Diagnosis not present

## 2018-06-18 DIAGNOSIS — N186 End stage renal disease: Secondary | ICD-10-CM | POA: Diagnosis not present

## 2018-06-18 DIAGNOSIS — E440 Moderate protein-calorie malnutrition: Secondary | ICD-10-CM

## 2018-06-18 DIAGNOSIS — N2581 Secondary hyperparathyroidism of renal origin: Secondary | ICD-10-CM | POA: Diagnosis not present

## 2018-06-18 DIAGNOSIS — I96 Gangrene, not elsewhere classified: Secondary | ICD-10-CM | POA: Diagnosis not present

## 2018-06-18 DIAGNOSIS — R6520 Severe sepsis without septic shock: Secondary | ICD-10-CM | POA: Diagnosis not present

## 2018-06-18 DIAGNOSIS — I10 Essential (primary) hypertension: Secondary | ICD-10-CM | POA: Diagnosis not present

## 2018-06-18 DIAGNOSIS — A419 Sepsis, unspecified organism: Secondary | ICD-10-CM | POA: Diagnosis not present

## 2018-06-18 DIAGNOSIS — Z89021 Acquired absence of right finger(s): Secondary | ICD-10-CM | POA: Diagnosis not present

## 2018-06-18 DIAGNOSIS — R41841 Cognitive communication deficit: Secondary | ICD-10-CM | POA: Diagnosis not present

## 2018-06-18 DIAGNOSIS — D631 Anemia in chronic kidney disease: Secondary | ICD-10-CM | POA: Diagnosis not present

## 2018-06-18 DIAGNOSIS — Z4801 Encounter for change or removal of surgical wound dressing: Secondary | ICD-10-CM | POA: Diagnosis not present

## 2018-06-18 MED ORDER — VANCOMYCIN HCL IN DEXTROSE 750-5 MG/150ML-% IV SOLN: 750.0000 mg | INTRAVENOUS | 0 refills | Status: AC

## 2018-06-18 MED ORDER — HYDROCODONE-ACETAMINOPHEN 5-325 MG PO TABS: 1.0000 | ORAL_TABLET | Freq: Four times a day (QID) | ORAL | 0 refills | Status: DC | PRN

## 2018-06-18 MED ORDER — HEPARIN SODIUM (PORCINE) 1000 UNIT/ML IJ SOLN
INTRAMUSCULAR | Status: AC
  Filled 2018-06-18: qty 3

## 2018-06-18 MED ORDER — NEPRO/CARBSTEADY PO LIQD: 237.0000 mL | Freq: Two times a day (BID) | ORAL | 0 refills | Status: DC

## 2018-06-18 NOTE — Plan of Care (Signed)

## 2018-06-18 NOTE — Clinical Social Work Placement (Signed)
   CLINICAL SOCIAL WORK PLACEMENT  NOTE  Date:  06/18/2018  Patient Details  Name: Joseph BEKKER Sr. MRN: [id number] Date of Birth: [birth year]  Clinical Social Work is seeking post-discharge placement for this patient at the [place] level of care (*CSW will initial, date and re-position this form in  chart as items are completed):  Yes   Patient/family provided with [place] Work Department's list of facilities offering this level of care within the geographic area requested by the patient (or if unable, by the patient's family).  Yes   Patient/family informed of their freedom to choose among providers that offer the needed level of care, that participate in Medicare, Medicaid or managed care program needed by the patient, have an available bed and are willing to accept the patient.  Yes   Patient/family informed of [place]'s ownership interest in [facility] and [facility], as well as of the fact that they are under no obligation to receive care at these facilities.  PASRR submitted to EDS on       PASRR number received on       Existing PASRR number confirmed on 06/16/18     FL2 transmitted to all facilities in geographic area requested by pt/family on 06/16/18     FL2 transmitted to all facilities within larger geographic area on       Patient informed that his/her managed care company has contracts with or will negotiate with certain facilities, including the following:        Yes   Patient/family informed of bed offers received.  Patient chooses bed at [facility]     Physician recommends and patient chooses bed at      Patient to be transferred to [facility] on 06/18/18.  Patient to be transferred to facility by [facility]     Patient family notified on 06/18/18 of transfer.  Name of family member notified:  Patient declined     PHYSICIAN       Additional Comment:     _______________________________________________ Benard Halsted, LCSW 06/18/2018, 12:41 PM

## 2018-06-18 NOTE — Progress Notes (Signed)
Patient will DC to: [facility] Anticipated DC date: 06/18/18 Family notified: Patient declined Transport by: Corey Harold   Per MD patient ready for DC to [facility]. RN, patient, patient's family, and facility notified of DC. Discharge Summary and FL2 sent to facility. RN to call report prior to discharge ([phone]). DC packet on chart. Ambulance transport requested for patient.   CSW will sign off for now as social work intervention is no longer needed. Please consult [place] again if new needs arise.  Cedric Fishman, LCSW Clinical Social Worker [phone]

## 2018-06-18 NOTE — Progress Notes (Signed)
[facility] Progress Note  Subjective: going to SNF today  Vitals:   06/17/18 1508 06/17/18 2154 06/18/18 0537 06/18/18 1351  BP: 114/70  139/70 107/68  Pulse: 85 85 86 91  Resp: 16 15  16   Temp: 98.7 F (37.1 C) 98.5 F (36.9 C) 98.4 F (36.9 C) 98.4 F (36.9 C)  TempSrc: Oral Oral Oral Oral  SpO2: 97% 96% 96% 96%  Weight:      Height:        Inpatient medications: . Chlorhexidine Gluconate Cloth  6 each Topical Q0600  . feeding supplement (NEPRO CARB STEADY)  237 mL Oral BID BM  . heparin      . heparin  5,000 Units Subcutaneous Q8H  . multivitamin  1 tablet Oral QHS   . vancomycin Stopped (06/17/18 1938)   acetaminophen **OR** acetaminophen, heparin, HYDROcodone-acetaminophen, HYDROmorphone (DILAUDID) injection, loperamide, ondansetron **OR** ondansetron (ZOFRAN) IV, senna-docusate  Iron/TIBC/Ferritin/ %Sat No results found for: IRON, TIBC, FERRITIN, IRONPCTSAT  Exam: Gen alert, no distress No jvd or bruits Chest clear bilat RRR no MRG Abd soft ntnd no mass or ascites +bs Ext R 2nd finger amputation wrapped, no LE edema, 2+ RUE edema Neuro is alert, Ox 3 , nf   Home meds:  - atorvastatin 40/ nitroglycerin patch 0.2mg  qd/ pentoxifylline  - cinacalcet 60 hs/ sevelamer 2-3 ac tid/ potassium po qd  - oxycodone-acetaminophen 5-325 qid prn/ tramadol 50mg  tid prn/ prn ondansetron 8mg  tid/ ibuprofen prn  - vitmains  Dialysis: [facility] MWF  4h  77kg   Heparin 1000 then 2000/hr   R IJ TDC (R AVG sp ligation)   CXR 2/11 no edema  Assessment/ Plan: 1. 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 Hypertension: BP's stable 3. ESRD on HD MWF. Got cath TPA'd overnight.  4. HL 5. MBD ckd 6. 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 Dispo - to SNF today   [facility] 06/18/2018, 3:05 PM  Recent Labs  Lab 06/15/18 1300 06/16/18 0325 06/17/18 0458  NA 138 136 136  K 3.9 4.0 4.6  CL 100 101 102  CO2 23 22 21*   GLUCOSE 102* 93 84  BUN 51* 32* 40*  CREATININE 10.73* 8.43* 10.06*  CALCIUM 9.1 9.2 9.4  PHOS 4.7* 4.7*  --   ALBUMIN 2.0* 1.9*  --    Recent Labs  Lab 06/12/18 1141 06/13/18 0109  AST 20 18  ALT <5 7  ALKPHOS 79 70  BILITOT 0.6 0.8  PROT 8.1 7.2   Recent Labs  Lab 06/16/18 0325 06/17/18 0458  WBC 13.5* 11.9*  NEUTROABS 10.0* 8.4*  HGB 11.4* 11.6*  HCT 35.4* 36.2*  MCV 61.5* 62.3*  PLT 387 377

## 2018-06-18 NOTE — Discharge Summary (Signed)
PATIENT DETAILS Name: Joseph SAINTJEAN Sr. Age: 78 y.o. Sex: male Date of Birth: Apr 19, 1941 MRN: [id number]. Admitting Physician: Murlean Iba, MD [id number], Brandon Melnick, MD  Admit Date: 06/12/2018 Discharge date: 06/18/2018  Recommendations for Outpatient Follow-up:  1. Follow up with PCP in 1-2 weeks 2. Please obtain BMP/CBC in one week  Admitted From:  Home  Disposition: SNF   Home Health: No  Equipment/Devices: None  Discharge Condition: Stable  CODE STATUS: FULL CODE  Diet recommendation:  Heart Healthy   Brief Summary: See H&P, Labs, Consult and Test reports for all details in brief, Patient is a 78 y.o. 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  Evaluated by hand surgery-underwent amputation of the right long finger on 2/15.  See below for further details  Brief Hospital Course: Sepsis secondary to infection and gangrene of the long finger of the right hand: Sepsis pathophysiology has resolved, underwent right long finger amputation on 2/15.  Blood cultures on 2/14 remain negative.    Remains on IV vancomycin.  Spoke with Dr. Lenon Curt over the phone on 2/20-okay to discharge to SNF with IV vancomycin for another 2 weeks (patient will get with HD).  He will arrange for follow-up in his office over the next few days-if wound exhibits any signs of worsening-patient may require another I&D in the near future.  Continue wound care-see below.  ?  X-ray evidence of osteomyelitis of the metatarsals of the first and fifth tarsal shafts: Seen by Dr. Loma Sender clinical evidence of osteomyelitis at this time no skin breakdown-no ulceration at the amputation site-agree with orthopedics that there is no clinical features suggestive of osteomyelitis at this point.  Dr. Sharol Given will continue to follow foot in the office.  ESRD: On HD MWF-defer further to  nephrology.  Left foot plantar surface wound: Continue outpatient follow-up with podiatry/orthopedics.  Recent steal syndrome status post ligation of right AV graft and placement of right subclavian TDC by vascular surgery on 2/11  Procedures/Studies: 2/15>>right long finger amputation  Discharge Diagnoses:  Principal Problem:   Severe sepsis ([place]) Active Problems:   ESRD on dialysis ([place])   Non-ischemic cardiomyopathy- EF 35- 45%   CAD- 40-50% LAD at cath 07/11/15   Aspirin intolerance   Moderate aortic stenosis   PVD (peripheral vascular disease) ([facility])   S/P transmetatarsal amputation of foot, right ([facility])   Gangrene of finger ([facility])   Leukocytosis   MRSA (methicillin resistant staph aureus) culture positive   Essential hypertension   Discharge Instructions:  Activity:  As tolerated with Full fall precautions use walker/cane & assistance as needed   Discharge Instructions    Diet - low sodium heart healthy   Complete by:  As directed    Discharge wound care:   Complete by:  As directed    1.Right Hand: Irrigate amputation site ( stick saline flush syringe gently in the wound) with 2 saline flushes 3x daily,  Gently squeeze/"milk"  Back of hand and palm from proximal to distal to expel any remaining fluid!) then insert end of a 2x2 into wound to stent open, and cover.  2.Left Foot wound:Cut small piece of Aquacel and tuck into left foot wound Q day, using swab to fill.  Cover with foam dressing.  (Change foam dressing Q 3 days or PRN soiling.)  Moisten previous dressing with NS to remove each time   Increase  activity slowly   Complete by:  As directed      Allergies as of 06/18/2018      Reactions   Aspirin Other (See Comments)   INTERNAL BLEEDING Causes internal bleeding  Other Reaction: history of ulcers      Medication List    STOP taking these medications   ibuprofen 200 MG tablet Commonly known as:  ADVIL,MOTRIN   ondansetron 8 MG tablet Commonly known  as:  ZOFRAN   oxyCODONE-acetaminophen 5-325 MG tablet Commonly known as:  PERCOCET/ROXICET   pentoxifylline 400 MG CR tablet Commonly known as:  TRENTAL   POTASSIMIN PO   traMADol 50 MG tablet Commonly known as:  ULTRAM     TAKE these medications   atorvastatin 40 MG tablet Commonly known as:  LIPITOR Take 40 mg by mouth daily.   b complex-vitamin c-folic acid 0.8 MG Tabs tablet Take 0.8 mg by mouth See admin instructions. Takes on Tuesdays, Thursdays, Saturdays, and Sundays. Does not take on Mondays, Wednesdays, and Fridays due to Dialysis treatments.   cinacalcet 60 MG tablet Commonly known as:  SENSIPAR Take 60 mg by mouth every evening. With evening meal   feeding supplement (NEPRO CARB STEADY) Liqd Take 237 mLs by mouth 2 (two) times daily between meals.   HYDROcodone-acetaminophen 5-325 MG tablet Commonly known as:  NORCO/VICODIN Take 1 tablet by mouth every 6 (six) hours as needed for moderate pain. What changed:  when to take this   nitroGLYCERIN 0.2 mg/hr patch Commonly known as:  NITRODUR - Dosed in mg/24 hr Place 1 patch (0.2 mg total) onto the skin daily. Apply 1 patch to dorsum of foot daily.   sevelamer carbonate 800 MG tablet Commonly known as:  RENVELA Take 1,600-3,200 mg by mouth See admin instructions. Take 3200  mg by mouth 3 times daily with full meals and take 1600 mg by mouth with snacks.   Vancomycin 750-5 MG/150ML-% Soln Commonly known as:  VANCOCIN Inject 150 mLs (750 mg total) into the vein every Monday, Wednesday, and Friday with hemodialysis for 14 days. For 2 additional weeks Start taking on:  June 19, 2018            Discharge Care Instructions  (From admission, onward)         Start     Ordered   06/18/18 0000  Discharge wound care:    Comments:  1.Right Hand: Irrigate amputation site ( stick saline flush syringe gently in the wound) with 2 saline flushes 3x daily,  Gently squeeze/"milk"  Back of hand and palm from proximal  to distal to expel any remaining fluid!) then insert end of a 2x2 into wound to stent open, and cover.  2.Left Foot wound:Cut small piece of Aquacel and tuck into left foot wound Q day, using swab to fill.  Cover with foam dressing.  (Change foam dressing Q 3 days or PRN soiling.)  Moisten previous dressing with NS to remove each time   06/18/18 1020          Contact information for follow-up providers    [name], MD Follow up in 4 week(s).   Specialty:  Orthopedic Surgery Contact information: [place] [address] [phone]        Dayna Barker, MD. Schedule an appointment as soon as possible for a visit in 5 day(s).   Specialty:  General Surgery Contact information: [address] [place] [address] [phone]  [facility] Follow up.   Why:  follow at your usual schedule           Contact information for after-discharge care    Destination    [facility] .   Service:  Skilled Nursing Contact information: [address] [place] [phone]                 Allergies  Allergen Reactions  . Aspirin Other (See Comments)    INTERNAL BLEEDING Causes internal bleeding  Other Reaction: history of ulcers    Consultations:   nephrology and orthopedic surgery   Other Procedures/Studies: Dg Hand 2 View Right  Result Date: 06/12/2018 CLINICAL DATA:  Acute right hand pain and swelling. EXAM: RIGHT HAND - 2 VIEW COMPARISON:  None. FINDINGS: There is no evidence of fracture or dislocation. There is no evidence of arthropathy or other focal bone abnormality. Vascular calcifications are noted. Dorsal soft tissue swelling is noted which contains gas consistent with cellulitis. Severe enlargement of the third and fourth fingers is noted consistent with infection. Gas is present and gangrene can not be excluded.  IMPRESSION: Dorsal soft tissue swelling is noted which contains gas consistent with cellulitis. Severe soft tissue enlargement of the third and fourth fingers is noted as well which contains gas concerning for cellulitis or possibly gangrene. No definite lytic destruction is seen to suggest osteomyelitis. Electronically Signed   By: Marijo Conception, M.D.   On: 06/12/2018 13:56   Dg Chest Port 1 View  Result Date: 06/09/2018 CLINICAL DATA:  78 year old male status post dialysis catheter placement. EXAM: PORTABLE CHEST 1 VIEW COMPARISON:  03/31/2017 and earlier. FINDINGS: Portable AP semi upright view at 0923 hours. Right chest dual lumen dialysis type catheter in place. Catheter tips project at the lower SVC level. Lower lung volumes. Confluent opacity at the left lung base similar to the prior study. No pneumothorax, pulmonary edema or definite pleural effusion. Mediastinal contours remain normal. Calcified aortic atherosclerosis. Visualized tracheal air column is within normal limits. Negative visible bowel gas pattern. Chronic right humerus intramedullary rod. IMPRESSION: 1. Right chest dialysis catheter with catheter tips at the lower SVC level. 2. Atelectasis or consolidation at the left lung base similar to 2018 radiographs. No definite pleural effusion. 3.  Aortic Atherosclerosis (ICD10-I70.0). Electronically Signed   By: Genevie Ann M.D.   On: 06/09/2018 09:40   Dg Foot 2 Views Right  Result Date: 06/15/2018 CLINICAL DATA:  Open wound at amputation site. EXAM: RIGHT FOOT - 2 VIEW COMPARISON:  03/05/2012 FINDINGS: Patient is status post transmetatarsal amputation of the digits with ankylosed appearance between the second and fourth metatarsal shafts. Tapering of the fifth metatarsal shaft is noted with faint irregularity of the tip. Similarly slightly frayed appearance involving the distal amputation margin of the first metatarsal is noted. Findings CT excluded osteomyelitis. Thickening of the amputation  stump with soft tissue ulceration is suggested along the medial aspect of the forefoot. Enthesophytes are noted along plantar dorsal aspect of the calcaneus. The ankle and subtalar joints are maintained as are the midfoot articulations. Vascular calcifications crossing the ankle joint are noted keeping diabetes. IMPRESSION: Status post transmetatarsal amputation of the first through fifth rays with ankylosed appearance between the second and fourth metatarsal shafts. A slightly frayed appearance involving the amputation margin of the first and fifth metatarsals are noted. Findings can not exclude osteomyelitis. Inflammatory thickening with subtle soft tissue ulceration/emphysema is noted  along the plantar aspect of the amputation margin. Electronically Signed   By: Ashley Royalty M.D.   On: 06/15/2018 23:07   Dg Fluoro Guide Cv Line-no Report  Result Date: 06/09/2018 Fluoroscopy was utilized by the requesting physician.  No radiographic interpretation.      TODAY-DAY OF DISCHARGE:  Subjective:   [name] today has no headache,no chest abdominal pain,no new weakness tingling or numbness, feels much better wants to go home today.   Objective:   Blood pressure 139/70, pulse 86, temperature 98.4 F (36.9 C), temperature source Oral, resp. rate 15, height 5\' 5"  (1.651 m), weight 79 kg, SpO2 96 %.  Intake/Output Summary (Last 24 hours) at 06/18/2018 1021 Last data filed at 06/17/2018 1800 Gross per 24 hour  Intake 50 ml  Output 1700 ml  Net -1650 ml   Filed Weights   06/17/18 0338 06/17/18 0700 06/17/18 1105  Weight: 77.7 kg 80.6 kg 79 kg    Exam: Awake Alert, Oriented *3, No new F.N deficits, Normal affect [place].AT,PERRAL Supple Neck,No JVD, No cervical lymphadenopathy appriciated.  Symmetrical Chest wall movement, Good air movement bilaterally, CTAB RRR,No Gallops,Rubs or new Murmurs, No Parasternal Heave +ve B.Sounds, Abd Soft, Non tender, No organomegaly appriciated, No rebound -guarding  or rigidity. No Cyanosis, Clubbing or edema, No new Rash or bruise   PERTINENT RADIOLOGIC STUDIES: Dg Hand 2 View Right  Result Date: 06/12/2018 CLINICAL DATA:  Acute right hand pain and swelling. EXAM: RIGHT HAND - 2 VIEW COMPARISON:  None. FINDINGS: There is no evidence of fracture or dislocation. There is no evidence of arthropathy or other focal bone abnormality. Vascular calcifications are noted. Dorsal soft tissue swelling is noted which contains gas consistent with cellulitis. Severe enlargement of the third and fourth fingers is noted consistent with infection. Gas is present and gangrene can not be excluded. IMPRESSION: Dorsal soft tissue swelling is noted which contains gas consistent with cellulitis. Severe soft tissue enlargement of the third and fourth fingers is noted as well which contains gas concerning for cellulitis or possibly gangrene. No definite lytic destruction is seen to suggest osteomyelitis. Electronically Signed   By: Marijo Conception, M.D.   On: 06/12/2018 13:56   Dg Chest Port 1 View  Result Date: 06/09/2018 CLINICAL DATA:  78 year old male status post dialysis catheter placement. EXAM: PORTABLE CHEST 1 VIEW COMPARISON:  03/31/2017 and earlier. FINDINGS: Portable AP semi upright view at 0923 hours. Right chest dual lumen dialysis type catheter in place. Catheter tips project at the lower SVC level. Lower lung volumes. Confluent opacity at the left lung base similar to the prior study. No pneumothorax, pulmonary edema or definite pleural effusion. Mediastinal contours remain normal. Calcified aortic atherosclerosis. Visualized tracheal air column is within normal limits. Negative visible bowel gas pattern. Chronic right humerus intramedullary rod. IMPRESSION: 1. Right chest dialysis catheter with catheter tips at the lower SVC level. 2. Atelectasis or consolidation at the left lung base similar to 2018 radiographs. No definite pleural effusion. 3.  Aortic Atherosclerosis  (ICD10-I70.0). Electronically Signed   By: Genevie Ann M.D.   On: 06/09/2018 09:40   Dg Foot 2 Views Right  Result Date: 06/15/2018 CLINICAL DATA:  Open wound at amputation site. EXAM: RIGHT FOOT - 2 VIEW COMPARISON:  03/05/2012 FINDINGS: Patient is status post transmetatarsal amputation of the digits with ankylosed appearance between the second and fourth metatarsal shafts. Tapering of the fifth metatarsal shaft is noted with faint irregularity of the tip. Similarly slightly frayed appearance involving  the distal amputation margin of the first metatarsal is noted. Findings CT excluded osteomyelitis. Thickening of the amputation stump with soft tissue ulceration is suggested along the medial aspect of the forefoot. Enthesophytes are noted along plantar dorsal aspect of the calcaneus. The ankle and subtalar joints are maintained as are the midfoot articulations. Vascular calcifications crossing the ankle joint are noted keeping diabetes. IMPRESSION: Status post transmetatarsal amputation of the first through fifth rays with ankylosed appearance between the second and fourth metatarsal shafts. A slightly frayed appearance involving the amputation margin of the first and fifth metatarsals are noted. Findings can not exclude osteomyelitis. Inflammatory thickening with subtle soft tissue ulceration/emphysema is noted along the plantar aspect of the amputation margin. Electronically Signed   By: Ashley Royalty M.D.   On: 06/15/2018 23:07   Dg Fluoro Guide Cv Line-no Report  Result Date: 06/09/2018 Fluoroscopy was utilized by the requesting physician.  No radiographic interpretation.     PERTINENT LAB RESULTS: CBC: Recent Labs    06/16/18 0325 06/17/18 0458  WBC 13.5* 11.9*  HGB 11.4* 11.6*  HCT 35.4* 36.2*  PLT 387 377   CMET CMP     Component Value Date/Time   NA 136 06/17/2018 0458   K 4.6 06/17/2018 0458   CL 102 06/17/2018 0458   CO2 21 (L) 06/17/2018 0458   GLUCOSE 84 06/17/2018 0458   BUN 40  (H) 06/17/2018 0458   CREATININE 10.06 (H) 06/17/2018 0458   CREATININE 10.80 (H) 07/06/2015 1158   CALCIUM 9.4 06/17/2018 0458   PROT 7.2 06/13/2018 0109   ALBUMIN 1.9 (L) 06/16/2018 0325   AST 18 06/13/2018 0109   ALT 7 06/13/2018 0109   ALKPHOS 70 06/13/2018 0109   BILITOT 0.8 06/13/2018 0109   GFRNONAA 4 (L) 06/17/2018 0458   GFRAA 5 (L) 06/17/2018 0458    GFR Estimated Creatinine Clearance: 6.1 mL/min (A) (by C-G formula based on SCr of 10.06 mg/dL (H)). No results for input(s): LIPASE, AMYLASE in the last 72 hours. No results for input(s): CKTOTAL, CKMB, CKMBINDEX, TROPONINI in the last 72 hours. Invalid input(s): POCBNP No results for input(s): DDIMER in the last 72 hours. No results for input(s): HGBA1C in the last 72 hours. No results for input(s): CHOL, HDL, LDLCALC, TRIG, CHOLHDL, LDLDIRECT in the last 72 hours. No results for input(s): TSH, T4TOTAL, T3FREE, THYROIDAB in the last 72 hours.  Invalid input(s): FREET3 No results for input(s): VITAMINB12, FOLATE, FERRITIN, TIBC, IRON, RETICCTPCT in the last 72 hours. Coags: No results for input(s): INR in the last 72 hours.  Invalid input(s): PT Microbiology: Recent Results (from the past 240 hour(s))  MRSA PCR Screening     Status: Abnormal   Collection Time: 06/09/18  2:49 PM  Result Value Ref Range Status   MRSA by PCR POSITIVE (A) NEGATIVE Final    Comment:        The GeneXpert MRSA Assay (FDA approved for NASAL specimens only), is one component of a comprehensive MRSA colonization surveillance program. It is not intended to diagnose MRSA infection nor to guide or monitor treatment for MRSA infections. RESULT CALLED TO, READ BACK BY AND VERIFIED WITH: Mohammed Kindle RN 17:30 06/09/18 ([email or web address]) Performed at [facility], [place] [address], [place],  [address]   Blood Culture (routine x 2)     Status: None   Collection Time: 06/12/18 11:41 AM  Result Value Ref Range Status   Specimen Description BLOOD  LEFT ARM DRAWN BY RN  Final  Special Requests   Final    BOTTLES DRAWN AEROBIC AND ANAEROBIC Blood Culture adequate volume   Culture   Final    NO GROWTH 5 DAYS Performed at [facility], [address], [place], [place] [address]    Report Status 06/17/2018 FINAL  Final  Blood Culture (routine x 2)     Status: None   Collection Time: 06/12/18 11:46 AM  Result Value Ref Range Status   Specimen Description LEFT ANTECUBITAL DRAWN BY RN  Final   Special Requests   Final    BOTTLES DRAWN AEROBIC AND ANAEROBIC Blood Culture adequate volume   Culture   Final    NO GROWTH 5 DAYS Performed at [facility], [address], [place], [place] [address]    Report Status 06/17/2018 FINAL  Final    FURTHER DISCHARGE INSTRUCTIONS:  Get Medicines reviewed and adjusted: Please take all your medications with you for your next visit with your Primary MD  Laboratory/radiological data: Please request your Primary MD to go over all hospital tests and procedure/radiological results at the follow up, please ask your Primary MD to get all Hospital records sent to his/her office.  In some cases, they will be blood work, cultures and biopsy results pending at the time of your discharge. Please request that your primary care M.D. goes through all the records of your hospital data and follows up on these results.  Also Note the following: If you experience worsening of your admission symptoms, develop shortness of breath, life threatening emergency, suicidal or homicidal thoughts you must seek medical attention immediately by calling 911 or calling your MD immediately  if symptoms less severe.  You must read complete instructions/literature along with all the possible adverse reactions/side effects for all the Medicines you take and that have been prescribed to you. Take any new Medicines after you have completely understood and accpet all the possible adverse reactions/side effects.   Do not drive when taking  Pain medications or sleeping medications (Benzodaizepines)  Do not take more than prescribed Pain, Sleep and Anxiety Medications. It is not advisable to combine anxiety,sleep and pain medications without talking with your primary care practitioner  Special Instructions: If you have smoked or chewed Tobacco  in the last 2 yrs please stop smoking, stop any regular Alcohol  and or any Recreational drug use.  Wear Seat belts while driving.  Please note: You were cared for by a hospitalist during your hospital stay. Once you are discharged, your primary care physician will handle any further medical issues. Please note that NO REFILLS for any discharge medications will be authorized once you are discharged, as it is imperative that you return to your primary care physician (or establish a relationship with a primary care physician if you do not have one) for your post hospital discharge needs so that they can reassess your need for medications and monitor your lab values.  Total Time spent coordinating discharge including counseling, education and face to face time equals 35 minutes.  SignedOren Binet 06/18/2018 10:21 AM

## 2018-06-18 NOTE — Progress Notes (Signed)
Physical Therapy Treatment Patient Details Name: [name] Sr. MRN: [id number] DOB: 01/19/1941 Today's Date: 06/18/2018    History of Present Illness Pt is a 78 y.o. male who presented with severe sepsis, s/p R long finger amputation. PMH: R transmetatarsal amputation, ESRD on dialysis, CVA, aortic stenosis, HTN, anxiety, cardiomyopathy, shoulder surgery.    PT Comments    Steady improvement in stability with pt not comfortable using cane in left hand.  Though uses cane safely and effectively once cued to use it.    Follow Up Recommendations  SNF     Equipment Recommendations  None recommended by PT    Recommendations for Other Services       Precautions / Restrictions Precautions Precautions: Fall Restrictions RUE Weight Bearing: Non weight bearing    Mobility  Bed Mobility               General bed mobility comments: OOB in the recliner on arrival  Transfers Overall transfer level: Needs assistance Equipment used: Straight cane Transfers: Sit to/from Stand Sit to Stand: Min guard            Ambulation/Gait Ambulation/Gait assistance: Min guard Gait Distance (Feet): 150 Feet(x2) Assistive device: Straight cane Gait Pattern/deviations: Step-through pattern Gait velocity: moderate Gait velocity interpretation: 1.31 - 2.62 ft/sec, indicative of limited community ambulator General Gait Details: generally safe use of the cane once cued to "use it" and not carry it along.   Stairs             Wheelchair Mobility    Modified Rankin (Stroke Patients Only)       Balance     Sitting balance-Leahy Scale: Good       Standing balance-Leahy Scale: Fair Standing balance comment: needing AD or external support for dynamic tasks.                            Cognition Arousal/Alertness: Awake/alert Behavior During Therapy: WFL for tasks assessed/performed Overall Cognitive Status: Within Functional Limits for tasks assessed                                         Exercises      General Comments        Pertinent Vitals/Pain Faces Pain Scale: Hurts little more Pain Location: R hand Pain Descriptors / Indicators: Operative site guarding    Home Living                      Prior Function            PT Goals (current goals can now be found in the care plan section) Acute Rehab PT Goals Patient Stated Goal: get better PT Goal Formulation: With patient Time For Goal Achievement: 06/29/18 Potential to Achieve Goals: Good Progress towards PT goals: Progressing toward goals    Frequency    Min 3X/week      PT Plan Current plan remains appropriate    Co-evaluation              AM-PAC PT "6 Clicks" Mobility   Outcome Measure  Help needed turning from your back to your side while in a flat bed without using bedrails?: A Little Help needed moving from lying on your back to sitting on the side of a flat bed without using bedrails?: A Little Help  needed moving to and from a bed to a chair (including a wheelchair)?: A Little Help needed standing up from a chair using your arms (e.g., wheelchair or bedside chair)?: A Little Help needed to walk in hospital room?: A Little Help needed climbing 3-5 steps with a railing? : A Little 6 Click Score: 18    End of Session   Activity Tolerance: Patient tolerated treatment well Patient left: in chair;with call bell/phone within reach Nurse Communication: Mobility status PT Visit Diagnosis: Unsteadiness on feet (R26.81)     Time: 1215-1227 PT Time Calculation (min) (ACUTE ONLY): 12 min  Charges:  $Gait Training: 8-22 mins                     06/18/2018  Donnella Sham, [place] [phone]  (pager) [phone]  (office)   Tessie Fass [place] 06/18/2018, 12:40 PM

## 2018-06-18 NOTE — Progress Notes (Signed)
Arrived to patient bedside to remove Cathflo dwell from catheter and replace with heparin.  Performed dressing change. Patient alert oriented vitals stable alerted floor RN.

## 2018-06-19 DIAGNOSIS — N2581 Secondary hyperparathyroidism of renal origin: Secondary | ICD-10-CM | POA: Diagnosis not present

## 2018-06-19 DIAGNOSIS — T82898A Other specified complication of vascular prosthetic devices, implants and grafts, initial encounter: Secondary | ICD-10-CM | POA: Diagnosis not present

## 2018-06-19 DIAGNOSIS — T8249XA Other complication of vascular dialysis catheter, initial encounter: Secondary | ICD-10-CM | POA: Diagnosis not present

## 2018-06-19 DIAGNOSIS — N186 End stage renal disease: Secondary | ICD-10-CM | POA: Diagnosis not present

## 2018-06-19 DIAGNOSIS — A419 Sepsis, unspecified organism: Secondary | ICD-10-CM | POA: Diagnosis not present

## 2018-06-19 DIAGNOSIS — D509 Iron deficiency anemia, unspecified: Secondary | ICD-10-CM | POA: Diagnosis not present

## 2018-06-19 DIAGNOSIS — Z992 Dependence on renal dialysis: Secondary | ICD-10-CM | POA: Diagnosis not present

## 2018-06-20 DIAGNOSIS — T82898A Other specified complication of vascular prosthetic devices, implants and grafts, initial encounter: Secondary | ICD-10-CM | POA: Diagnosis not present

## 2018-06-20 DIAGNOSIS — N186 End stage renal disease: Secondary | ICD-10-CM | POA: Diagnosis not present

## 2018-06-20 DIAGNOSIS — A4189 Other specified sepsis: Secondary | ICD-10-CM | POA: Diagnosis not present

## 2018-06-20 DIAGNOSIS — Z992 Dependence on renal dialysis: Secondary | ICD-10-CM | POA: Diagnosis not present

## 2018-06-20 DIAGNOSIS — A419 Sepsis, unspecified organism: Secondary | ICD-10-CM | POA: Diagnosis not present

## 2018-06-20 DIAGNOSIS — D509 Iron deficiency anemia, unspecified: Secondary | ICD-10-CM | POA: Diagnosis not present

## 2018-06-20 DIAGNOSIS — N2581 Secondary hyperparathyroidism of renal origin: Secondary | ICD-10-CM | POA: Diagnosis not present

## 2018-06-20 DIAGNOSIS — I429 Cardiomyopathy, unspecified: Secondary | ICD-10-CM | POA: Diagnosis not present

## 2018-06-20 DIAGNOSIS — N184 Chronic kidney disease, stage 4 (severe): Secondary | ICD-10-CM | POA: Diagnosis not present

## 2018-06-22 DIAGNOSIS — N2581 Secondary hyperparathyroidism of renal origin: Secondary | ICD-10-CM | POA: Diagnosis not present

## 2018-06-22 DIAGNOSIS — N186 End stage renal disease: Secondary | ICD-10-CM | POA: Diagnosis not present

## 2018-06-22 DIAGNOSIS — A419 Sepsis, unspecified organism: Secondary | ICD-10-CM | POA: Diagnosis not present

## 2018-06-22 DIAGNOSIS — D509 Iron deficiency anemia, unspecified: Secondary | ICD-10-CM | POA: Diagnosis not present

## 2018-06-22 DIAGNOSIS — Z992 Dependence on renal dialysis: Secondary | ICD-10-CM | POA: Diagnosis not present

## 2018-06-22 DIAGNOSIS — T82898A Other specified complication of vascular prosthetic devices, implants and grafts, initial encounter: Secondary | ICD-10-CM | POA: Diagnosis not present

## 2018-06-24 DIAGNOSIS — T82898A Other specified complication of vascular prosthetic devices, implants and grafts, initial encounter: Secondary | ICD-10-CM | POA: Diagnosis not present

## 2018-06-24 DIAGNOSIS — N2581 Secondary hyperparathyroidism of renal origin: Secondary | ICD-10-CM | POA: Diagnosis not present

## 2018-06-24 DIAGNOSIS — D509 Iron deficiency anemia, unspecified: Secondary | ICD-10-CM | POA: Diagnosis not present

## 2018-06-24 DIAGNOSIS — Z992 Dependence on renal dialysis: Secondary | ICD-10-CM | POA: Diagnosis not present

## 2018-06-24 DIAGNOSIS — N186 End stage renal disease: Secondary | ICD-10-CM | POA: Diagnosis not present

## 2018-06-24 DIAGNOSIS — A419 Sepsis, unspecified organism: Secondary | ICD-10-CM | POA: Diagnosis not present

## 2018-06-25 ENCOUNTER — Other Ambulatory Visit: Payer: Self-pay | Admitting: *Deleted

## 2018-06-25 NOTE — Patient Outreach (Signed)
[facility]) Care Management  06/25/2018  [name] Sr. 1941/03/18 [id number]   Onsite visit to [facility].  IDT meeting.  Per therapy patient is high functioning, he was independent and driving before admission.  He goes to dialysis M,W,F He is on antibiotics and is getting wound care for a couple of wounds.  Discharge plan is to return home at [facility].   Attempted to meet with patient, he was not in his room.  Plan to monitor for any [facility] care management needs.  Royetta Crochet. Laymond Purser, MSN, RN, [employer] , [place] [phone]) Business Cell  [phone]) Toll Free Office

## 2018-06-26 DIAGNOSIS — A419 Sepsis, unspecified organism: Secondary | ICD-10-CM | POA: Diagnosis not present

## 2018-06-26 DIAGNOSIS — N186 End stage renal disease: Secondary | ICD-10-CM | POA: Diagnosis not present

## 2018-06-26 DIAGNOSIS — N2581 Secondary hyperparathyroidism of renal origin: Secondary | ICD-10-CM | POA: Diagnosis not present

## 2018-06-26 DIAGNOSIS — T82898A Other specified complication of vascular prosthetic devices, implants and grafts, initial encounter: Secondary | ICD-10-CM | POA: Diagnosis not present

## 2018-06-26 DIAGNOSIS — Z992 Dependence on renal dialysis: Secondary | ICD-10-CM | POA: Diagnosis not present

## 2018-06-26 DIAGNOSIS — D509 Iron deficiency anemia, unspecified: Secondary | ICD-10-CM | POA: Diagnosis not present

## 2018-06-27 DIAGNOSIS — N186 End stage renal disease: Secondary | ICD-10-CM | POA: Diagnosis not present

## 2018-06-27 DIAGNOSIS — Z992 Dependence on renal dialysis: Secondary | ICD-10-CM | POA: Diagnosis not present

## 2018-06-28 DIAGNOSIS — E611 Iron deficiency: Secondary | ICD-10-CM | POA: Diagnosis not present

## 2018-06-28 DIAGNOSIS — N186 End stage renal disease: Secondary | ICD-10-CM | POA: Diagnosis not present

## 2018-06-28 DIAGNOSIS — D509 Iron deficiency anemia, unspecified: Secondary | ICD-10-CM | POA: Diagnosis not present

## 2018-06-28 DIAGNOSIS — N2581 Secondary hyperparathyroidism of renal origin: Secondary | ICD-10-CM | POA: Diagnosis not present

## 2018-06-28 DIAGNOSIS — A419 Sepsis, unspecified organism: Secondary | ICD-10-CM | POA: Diagnosis not present

## 2018-06-28 DIAGNOSIS — Z992 Dependence on renal dialysis: Secondary | ICD-10-CM | POA: Diagnosis not present

## 2018-06-29 DIAGNOSIS — N186 End stage renal disease: Secondary | ICD-10-CM | POA: Diagnosis not present

## 2018-06-29 DIAGNOSIS — D509 Iron deficiency anemia, unspecified: Secondary | ICD-10-CM | POA: Diagnosis not present

## 2018-06-29 DIAGNOSIS — E611 Iron deficiency: Secondary | ICD-10-CM | POA: Diagnosis not present

## 2018-06-29 DIAGNOSIS — A419 Sepsis, unspecified organism: Secondary | ICD-10-CM | POA: Diagnosis not present

## 2018-06-29 DIAGNOSIS — Z992 Dependence on renal dialysis: Secondary | ICD-10-CM | POA: Diagnosis not present

## 2018-06-29 DIAGNOSIS — N2581 Secondary hyperparathyroidism of renal origin: Secondary | ICD-10-CM | POA: Diagnosis not present

## 2018-07-01 DIAGNOSIS — D509 Iron deficiency anemia, unspecified: Secondary | ICD-10-CM | POA: Diagnosis not present

## 2018-07-01 DIAGNOSIS — A419 Sepsis, unspecified organism: Secondary | ICD-10-CM | POA: Diagnosis not present

## 2018-07-01 DIAGNOSIS — Z992 Dependence on renal dialysis: Secondary | ICD-10-CM | POA: Diagnosis not present

## 2018-07-01 DIAGNOSIS — E611 Iron deficiency: Secondary | ICD-10-CM | POA: Diagnosis not present

## 2018-07-01 DIAGNOSIS — N186 End stage renal disease: Secondary | ICD-10-CM | POA: Diagnosis not present

## 2018-07-01 DIAGNOSIS — N2581 Secondary hyperparathyroidism of renal origin: Secondary | ICD-10-CM | POA: Diagnosis not present

## 2018-07-02 ENCOUNTER — Other Ambulatory Visit: Payer: Self-pay | Admitting: *Deleted

## 2018-07-02 NOTE — Patient Outreach (Signed)
[facility] [facility]) Care Management  07/02/2018  ADAIR LAUDERBACK Sr. 18-Jul-1940 [id number]   Collaboration with Lynelle Smoke, [place].  She reports patient discharging 07/04/18. He will have home care services.  No [facility] care management needs identified.  Plan to sign off. Royetta Crochet. Laymond Purser, MSN, RN, [employer] , [place] [phone]) Business Cell  [phone]) Toll Free Office

## 2018-07-03 DIAGNOSIS — Z992 Dependence on renal dialysis: Secondary | ICD-10-CM | POA: Diagnosis not present

## 2018-07-03 DIAGNOSIS — A419 Sepsis, unspecified organism: Secondary | ICD-10-CM | POA: Diagnosis not present

## 2018-07-03 DIAGNOSIS — E611 Iron deficiency: Secondary | ICD-10-CM | POA: Diagnosis not present

## 2018-07-03 DIAGNOSIS — D509 Iron deficiency anemia, unspecified: Secondary | ICD-10-CM | POA: Diagnosis not present

## 2018-07-03 DIAGNOSIS — N2581 Secondary hyperparathyroidism of renal origin: Secondary | ICD-10-CM | POA: Diagnosis not present

## 2018-07-03 DIAGNOSIS — N186 End stage renal disease: Secondary | ICD-10-CM | POA: Diagnosis not present

## 2018-07-06 DIAGNOSIS — A419 Sepsis, unspecified organism: Secondary | ICD-10-CM | POA: Diagnosis not present

## 2018-07-06 DIAGNOSIS — N186 End stage renal disease: Secondary | ICD-10-CM | POA: Diagnosis not present

## 2018-07-06 DIAGNOSIS — N2581 Secondary hyperparathyroidism of renal origin: Secondary | ICD-10-CM | POA: Diagnosis not present

## 2018-07-06 DIAGNOSIS — D509 Iron deficiency anemia, unspecified: Secondary | ICD-10-CM | POA: Diagnosis not present

## 2018-07-06 DIAGNOSIS — Z992 Dependence on renal dialysis: Secondary | ICD-10-CM | POA: Diagnosis not present

## 2018-07-06 DIAGNOSIS — E611 Iron deficiency: Secondary | ICD-10-CM | POA: Diagnosis not present

## 2018-07-08 ENCOUNTER — Other Ambulatory Visit: Payer: Self-pay

## 2018-07-08 DIAGNOSIS — Z992 Dependence on renal dialysis: Secondary | ICD-10-CM | POA: Diagnosis not present

## 2018-07-08 DIAGNOSIS — E611 Iron deficiency: Secondary | ICD-10-CM | POA: Diagnosis not present

## 2018-07-08 DIAGNOSIS — A419 Sepsis, unspecified organism: Secondary | ICD-10-CM | POA: Diagnosis not present

## 2018-07-08 DIAGNOSIS — N186 End stage renal disease: Secondary | ICD-10-CM | POA: Diagnosis not present

## 2018-07-08 DIAGNOSIS — D509 Iron deficiency anemia, unspecified: Secondary | ICD-10-CM | POA: Diagnosis not present

## 2018-07-08 DIAGNOSIS — N2581 Secondary hyperparathyroidism of renal origin: Secondary | ICD-10-CM | POA: Diagnosis not present

## 2018-07-08 NOTE — Patient Outreach (Signed)
[facility]) Care Management  07/08/2018  JERYL UMHOLTZ Sr. 11-May-1940 [id number]   EMMI- General Discharge RED ON EMMI ALERT Day # 1 Date:  07/07/2018 Red Alert Reason:  Scheduled follow-up? No   Outreach attempt: no answer.  Unable to leave a message.     Plan: RN CM will attempt again within 4 business days and send letter.    Jone Baseman, RN, MSN [facility] Care Management Care Management Coordinator Direct Line [phone] Toll Free: [phone]  Fax: [phone]

## 2018-07-09 ENCOUNTER — Other Ambulatory Visit: Payer: Self-pay

## 2018-07-09 NOTE — Patient Outreach (Signed)
[facility]) Care Management  07/09/2018  Joseph KUNIN Sr. 04/08/1941 [id number]   EMMI- General Discharge RED ON EMMI ALERT Day # 1 Date: 07/07/2018 Red Alert Reason: Scheduled follow-up? No   Outreach attempt: spoke with patient.  He reports he is doing well.  Discussed red alert. Patient states he has seen the hand surgeon and been released.  He states that he does see Dr. Sharol Given on 07-23-2018.  He states he has a conflict appointment with Dr. Legrand Rams on the same day but will be calling to reschedule appointment.  He states home health is involved for dressing changes to his hand.  He states that his hand is healing well, little to no drainage, and no redness to site.  Patient taking medications as prescribe and declines any further needs at this time.    Plan: RN CM will close case.    Jone Baseman, RN, MSN [place] Management Care Management Coordinator Direct Line [phone] Cell [phone] Toll Free: [phone]  Fax: [phone]

## 2018-07-10 DIAGNOSIS — N186 End stage renal disease: Secondary | ICD-10-CM | POA: Diagnosis not present

## 2018-07-10 DIAGNOSIS — D509 Iron deficiency anemia, unspecified: Secondary | ICD-10-CM | POA: Diagnosis not present

## 2018-07-10 DIAGNOSIS — Z992 Dependence on renal dialysis: Secondary | ICD-10-CM | POA: Diagnosis not present

## 2018-07-10 DIAGNOSIS — N2581 Secondary hyperparathyroidism of renal origin: Secondary | ICD-10-CM | POA: Diagnosis not present

## 2018-07-10 DIAGNOSIS — A419 Sepsis, unspecified organism: Secondary | ICD-10-CM | POA: Diagnosis not present

## 2018-07-10 DIAGNOSIS — E611 Iron deficiency: Secondary | ICD-10-CM | POA: Diagnosis not present

## 2018-07-13 ENCOUNTER — Other Ambulatory Visit: Payer: Self-pay

## 2018-07-13 DIAGNOSIS — N2581 Secondary hyperparathyroidism of renal origin: Secondary | ICD-10-CM | POA: Diagnosis not present

## 2018-07-13 DIAGNOSIS — A419 Sepsis, unspecified organism: Secondary | ICD-10-CM | POA: Diagnosis not present

## 2018-07-13 DIAGNOSIS — D509 Iron deficiency anemia, unspecified: Secondary | ICD-10-CM | POA: Diagnosis not present

## 2018-07-13 DIAGNOSIS — Z992 Dependence on renal dialysis: Secondary | ICD-10-CM | POA: Diagnosis not present

## 2018-07-13 DIAGNOSIS — N186 End stage renal disease: Secondary | ICD-10-CM | POA: Diagnosis not present

## 2018-07-13 DIAGNOSIS — E611 Iron deficiency: Secondary | ICD-10-CM | POA: Diagnosis not present

## 2018-07-15 DIAGNOSIS — A419 Sepsis, unspecified organism: Secondary | ICD-10-CM | POA: Diagnosis not present

## 2018-07-15 DIAGNOSIS — Z992 Dependence on renal dialysis: Secondary | ICD-10-CM | POA: Diagnosis not present

## 2018-07-15 DIAGNOSIS — E611 Iron deficiency: Secondary | ICD-10-CM | POA: Diagnosis not present

## 2018-07-15 DIAGNOSIS — D509 Iron deficiency anemia, unspecified: Secondary | ICD-10-CM | POA: Diagnosis not present

## 2018-07-15 DIAGNOSIS — N186 End stage renal disease: Secondary | ICD-10-CM | POA: Diagnosis not present

## 2018-07-15 DIAGNOSIS — N2581 Secondary hyperparathyroidism of renal origin: Secondary | ICD-10-CM | POA: Diagnosis not present

## 2018-07-15 NOTE — Patient Outreach (Signed)
[facility]) Care Management  07/15/2018  [name] Sr. [birth year] [id number]   Late entry for 07/13/2018  EMMI- General Discharge RED ON EMMI ALERT Day # 4 Date: 07/10/2018  Red Alert Reason:  Sad/hopeless/anxious/empty? Yes    Outreach attempt: spoke with patient.  He is able to verify HIPAA.  Addressed red alert with patient.  He denies any problems with feeling sad/hopeless/anxious/empty.  He also denies any problems with feeling depressed.  Patient denies any current needs.       Plan: RN CM will close case.  Jone Baseman, RN, MSN [facility] Care Management Care Management Coordinator Direct Line [phone] Toll Free: [phone]  Fax: [phone]

## 2018-07-16 DIAGNOSIS — N186 End stage renal disease: Secondary | ICD-10-CM | POA: Diagnosis not present

## 2018-07-16 DIAGNOSIS — I739 Peripheral vascular disease, unspecified: Secondary | ICD-10-CM | POA: Diagnosis not present

## 2018-07-16 DIAGNOSIS — S68122S Partial traumatic metacarpophalangeal amputation of right middle finger, sequela: Secondary | ICD-10-CM | POA: Diagnosis not present

## 2018-07-16 DIAGNOSIS — A419 Sepsis, unspecified organism: Secondary | ICD-10-CM | POA: Diagnosis not present

## 2018-07-17 DIAGNOSIS — N2581 Secondary hyperparathyroidism of renal origin: Secondary | ICD-10-CM | POA: Diagnosis not present

## 2018-07-17 DIAGNOSIS — A419 Sepsis, unspecified organism: Secondary | ICD-10-CM | POA: Diagnosis not present

## 2018-07-17 DIAGNOSIS — E611 Iron deficiency: Secondary | ICD-10-CM | POA: Diagnosis not present

## 2018-07-17 DIAGNOSIS — D509 Iron deficiency anemia, unspecified: Secondary | ICD-10-CM | POA: Diagnosis not present

## 2018-07-17 DIAGNOSIS — N186 End stage renal disease: Secondary | ICD-10-CM | POA: Diagnosis not present

## 2018-07-17 DIAGNOSIS — Z992 Dependence on renal dialysis: Secondary | ICD-10-CM | POA: Diagnosis not present

## 2018-07-20 DIAGNOSIS — Z992 Dependence on renal dialysis: Secondary | ICD-10-CM | POA: Diagnosis not present

## 2018-07-20 DIAGNOSIS — D509 Iron deficiency anemia, unspecified: Secondary | ICD-10-CM | POA: Diagnosis not present

## 2018-07-20 DIAGNOSIS — N2581 Secondary hyperparathyroidism of renal origin: Secondary | ICD-10-CM | POA: Diagnosis not present

## 2018-07-20 DIAGNOSIS — N186 End stage renal disease: Secondary | ICD-10-CM | POA: Diagnosis not present

## 2018-07-20 DIAGNOSIS — E611 Iron deficiency: Secondary | ICD-10-CM | POA: Diagnosis not present

## 2018-07-20 DIAGNOSIS — A419 Sepsis, unspecified organism: Secondary | ICD-10-CM | POA: Diagnosis not present

## 2018-07-21 ENCOUNTER — Telehealth (INDEPENDENT_AMBULATORY_CARE_PROVIDER_SITE_OTHER): Payer: Self-pay

## 2018-07-21 NOTE — Telephone Encounter (Signed)
Patient was called and screened and stated No to all questions.

## 2018-07-22 DIAGNOSIS — N186 End stage renal disease: Secondary | ICD-10-CM | POA: Diagnosis not present

## 2018-07-22 DIAGNOSIS — A419 Sepsis, unspecified organism: Secondary | ICD-10-CM | POA: Diagnosis not present

## 2018-07-22 DIAGNOSIS — N2581 Secondary hyperparathyroidism of renal origin: Secondary | ICD-10-CM | POA: Diagnosis not present

## 2018-07-22 DIAGNOSIS — E611 Iron deficiency: Secondary | ICD-10-CM | POA: Diagnosis not present

## 2018-07-22 DIAGNOSIS — D509 Iron deficiency anemia, unspecified: Secondary | ICD-10-CM | POA: Diagnosis not present

## 2018-07-22 DIAGNOSIS — Z992 Dependence on renal dialysis: Secondary | ICD-10-CM | POA: Diagnosis not present

## 2018-07-23 ENCOUNTER — Encounter (INDEPENDENT_AMBULATORY_CARE_PROVIDER_SITE_OTHER): Payer: Self-pay | Admitting: Orthopedic Surgery

## 2018-07-23 ENCOUNTER — Other Ambulatory Visit: Payer: Self-pay

## 2018-07-23 ENCOUNTER — Ambulatory Visit (INDEPENDENT_AMBULATORY_CARE_PROVIDER_SITE_OTHER): Payer: Medicare Other | Admitting: Orthopedic Surgery

## 2018-07-23 VITALS — Ht 65.0 in | Wt 174.0 lb

## 2018-07-23 DIAGNOSIS — L97411 Non-pressure chronic ulcer of right heel and midfoot limited to breakdown of skin: Secondary | ICD-10-CM | POA: Diagnosis not present

## 2018-07-23 DIAGNOSIS — Z89431 Acquired absence of right foot: Secondary | ICD-10-CM

## 2018-07-23 NOTE — Progress Notes (Signed)
Office Visit Note   Patient: Joseph THIER Sr.           Date of Birth: 11/19/1940           MRN: [id number] Visit Date: 07/23/2018              Requested by: [name], MD [address] [place], [place] [address] PCP: [name], MD  Chief Complaint  Patient presents with  . Right Foot - Follow-up      HPI: Patient is a 77 year old gentleman who presents with ulceration right transmetatarsal amputation.  Patient denies any drainage.  Patient is also status post right long finger amputation is concerned with swelling in his right hand.  Assessment & Plan: Visit Diagnoses:  1. Midfoot skin ulcer, right, limited to breakdown of skin ([place])   2. S/P transmetatarsal amputation of foot, right ([place])     Plan: Patient was given a compression sleeve to wear to decrease the swelling in his right hand.  He also was debrided of skin and soft tissue.  Continue with protective shoe wear.  Follow-Up Instructions: Return in about 4 weeks (around 08/20/2018).   Ortho Exam  Patient is alert, oriented, no adenopathy, well-dressed, normal affect, normal respiratory effort. Examination patient has an antalgic gait he has a massive ulcer over the right transmetatarsal amputation.  After informed consent a 10 blade knife was used to debride the skin and soft tissue back to bleeding viable granulation tissue.  There is no exposed bone or tendon the wound was 30 x 60 mm and 5 mm deep after debridement.  This was touched with silver nitrate and Band-Aids were applied.  Semination the right hand the long finger amputation is healing well he does have significant swelling in the hand but no signs of infection no pain with range of motion of his fingers.  Patient was placed in a compression sleeve.  Imaging: No results found. No images are attached to the encounter.  Labs: Lab Results  Component Value Date   REPTSTATUS 06/17/2018 FINAL 06/12/2018   GRAMSTAIN  02/18/2017    RARE WBC  PRESENT, PREDOMINANTLY PMN FEW GRAM POSITIVE COCCI    CULT  06/12/2018    NO GROWTH 5 DAYS Performed at [facility] [address], [place], [place] [address]      Lab Results  Component Value Date   ALBUMIN 1.9 (L) 06/16/2018   ALBUMIN 2.0 (L) 06/15/2018   ALBUMIN 1.9 (L) 06/14/2018    Body mass index is 28.96 kg/m.  Orders:  No orders of the defined types were placed in this encounter.  No orders of the defined types were placed in this encounter.    Procedures: No procedures performed  Clinical Data: No additional findings.  ROS:  All other systems negative, except as noted in the HPI. Review of Systems  Objective: Vital Signs: Ht 5\' 5"  (1.651 m)   Wt 174 lb (78.9 kg)   BMI 28.96 kg/m   Specialty Comments:  No specialty comments available.  PMFS History: Patient Active Problem List   Diagnosis Date Noted  . Malnutrition of moderate degree 06/18/2018  . Severe sepsis ([place]) 06/12/2018  . Leukocytosis 06/12/2018  . MRSA (methicillin resistant staph aureus) culture positive 06/12/2018  . Essential hypertension 06/12/2018  . ESRD (end stage renal disease) ([place]) 06/09/2018  . Gangrene of finger ([place]) 06/04/2018  . S/P transmetatarsal amputation of foot, right ([place]) 05/09/2017  . PVD (peripheral vascular disease) ([place]) 04/15/2017  . Nonischemic  cardiomyopathy ([place]) 08/15/2015  . Aortic stenosis 08/15/2015  . Moderate aortic stenosis 07/12/2015  . 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 CAD- 40-50% LAD at cath 07/11/15 07/11/2015  . Aspirin intolerance 07/11/2015  . Abnormal stress test   . ESRD on dialysis ([place]) 05/30/2015  . CTS (carpal tunnel syndrome) 02/28/2015  . PVD of LE - Dr Oneida Alar follows 08/20/2012  . Midfoot skin ulcer, right, limited to breakdown of skin ([place]) 01/16/2012  . Encounter for screening colonoscopy 10/08/2011  . Melena 10/08/2011  . Other complications due to renal dialysis device, implant, and graft 09/24/2011    Past Medical History:  Diagnosis Date  . Anxiety   . Aortic stenosis   . Arthritis   . CVA (cerebral infarction)   . ESRD (end stage renal disease) on dialysis [facility])    M/W/F at [facility] in [place]  . Essential hypertension    resolved with HD  . Gangrene of right foot ([place])   . Gastric ulcer 2004  . History of cardiomyopathy    LVEF normal as of February 2017  . History of stroke    Left side weakness  . Iron deficiency anemia     Family History  Problem Relation Age of Onset  . Hypertension Mother   . Colon cancer Neg Hx   . Liver disease Neg Hx     Past Surgical History:  Procedure Laterality Date  . ABDOMINAL AORTAGRAM N/A 01/24/2012   Procedure: ABDOMINAL Maxcine Ham;  Surgeon: Elam Dutch, MD;  Location: [facility] CATH LAB;  Service: Cardiovascular;  Laterality: N/A;  . ABDOMINAL AORTOGRAM W/LOWER EXTREMITY N/A 04/15/2017   Procedure: ABDOMINAL AORTOGRAM W/LOWER EXTREMITY;  Surgeon: Angelia Mould, MD;  Location: [place] CV LAB;  Service: Cardiovascular;  Laterality: N/A;  . AMPUTATION Right 05/09/2017   Procedure: RIGHT TRANSMETATARSAL AMPUTATION;  Surgeon: [name], MD;  Location: [place];  Service: Orthopedics;  Laterality: Right;  . AMPUTATION Right 06/13/2018   Procedure: AMPUTATION RIGHT LONG FINGER;  Surgeon: Dayna Barker, MD;  Location: [place];  Service: Plastics;  Laterality: Right;  . ARTERIOVENOUS GRAFT PLACEMENT Right right arm  . AV FISTULA PLACEMENT Left 08/31/2015   Procedure: ARTERIOVENOUS (AV) FISTULA CREATION- LEFT RADIOCEPHALIC;  Surgeon: Mal Misty, MD;  Location: [place];  Service: Vascular;  Laterality: Left;  . AV FISTULA PLACEMENT Right 02/18/2017   Procedure: INSERTION OF ARTERIOVENOUS (AV) GORE-TEX GRAFT  RIGHT UPPER ARM;  Surgeon: Conrad Buford, MD;  Location: [place];  Service: Vascular;  Laterality: Right;  . BASCILIC VEIN TRANSPOSITION Left 12/19/2015   Procedure: FIRST STAGE BRACHIAL VEIN TRANSPOSITION;  Surgeon: Conrad [place], MD;   Location: [place];  Service: Vascular;  Laterality: Left;  . BASCILIC VEIN TRANSPOSITION Left 02/08/2016   Procedure: SECOND STAGE BRACHIAL VEIN TRANSPOSITION;  Surgeon: Conrad [place], MD;  Location: [place];  Service: Vascular;  Laterality: Left;  . CARDIAC CATHETERIZATION N/A 07/11/2015   Procedure: Left Heart Cath and Coronary Angiography;  Surgeon: [place] Sine, MD;  Location: [place] CV LAB;  Service: Cardiovascular;  Laterality: N/A;  . Carpel Tunnel Left Dec. 22, 2016  . CHOLECYSTECTOMY    . COLONOSCOPY  2004   Dr. Irving Shows, left sided diverticula and cecal polyp, path unknown  . COLONOSCOPY  10/29/2011   Procedure: COLONOSCOPY;  Surgeon: Daneil Dolin, MD;  [facility];  Service: Endoscopy;  Laterality: N/A;  10:15  . ESOPHAGOGASTRODUODENOSCOPY  11/2002   Dr. Gala Romney, erosive reflux  esophagitis, multiple gastric ulcer and antral/bulbar erosions. Serologies positive for H.Pylori and was treated  . ESOPHAGOGASTRODUODENOSCOPY  11/20014   Dr. Gala Romney, small hh only, ulcers healed  . ESOPHAGOGASTRODUODENOSCOPY  09/21/2011   Dr Trevor Iha [facility], antral erosions, ?early GAVE  . FISTULOGRAM Left 12/10/2016   Procedure: THROMBECTOMY OF LEFT ARM ARTERIOVENOUS FISTULA;  Surgeon: Waynetta Sandy, MD;  Location: [place];  Service: Vascular;  Laterality: Left;  . INSERTION OF DIALYSIS CATHETER Left 12/10/2016   Procedure: INSERTION OF TUNNELED DIALYSIS CATHETER;  Surgeon: Waynetta Sandy, MD;  Location: [place];  Service: Vascular;  Laterality: Left;  . INSERTION OF DIALYSIS CATHETER Right 06/09/2018   Procedure: INSERTION OF DIALYSIS CATHETER Right subclavian;  Surgeon: Angelia Mould, MD;  Location: [place];  Service: Vascular;  Laterality: Right;  . IR DIALY SHUNT INTRO [place] W/IMG RIGHT Right 01/01/2018  . IR GENERIC HISTORICAL  07/16/2016   IR REMOVAL TUN CV CATH W/O FL 07/16/2016 Saverio Danker, PA-C [facility]-INTERV RAD  . IR PTA ADDL CENTRAL DIALYSIS SEG  THRU DIALY CIRCUIT RIGHT Right 10/21/2017  . IR REMOVAL TUN CV CATH W/O FL  05/12/2017  . IR THROMBECTOMY AV FISTULA W/THROMBOLYSIS/PTA INC/SHUNT/IMG RIGHT Right 10/21/2017  . IR THROMBECTOMY AV FISTULA W/THROMBOLYSIS/PTA INC/SHUNT/IMG RIGHT Right 11/27/2017  . IR US GUIDE VASC ACCESS RIGHT  10/21/2017  . IR US GUIDE VASC ACCESS RIGHT  11/27/2017  . IR US GUIDE VASC ACCESS RIGHT  01/01/2018  . LIGATION ARTERIOVENOUS GORTEX GRAFT Right 06/09/2018  . LIGATION ARTERIOVENOUS GORTEX GRAFT Right 06/09/2018   Procedure: LIGATION ARTERIOVENOUS GORTEX GRAFT RIGHT ARM;  Surgeon: Angelia Mould, MD;  Location: [place];  Service: Vascular;  Laterality: Right;  . LIGATION OF ARTERIOVENOUS  FISTULA Left 12/19/2015   Procedure: LIGATION OF RADIOCEPHALIC ARTERIOVENOUS  FISTULA;  Surgeon: Conrad [place], MD;  Location: [place];  Service: Vascular;  Laterality: Left;  Marland Kitchen MASS EXCISION Right 02/18/2017   Procedure: EXCISION OF RIGHT AXILLARY EPIDERMAL INCLUSION CYST;  Surgeon: Conrad Kyle, MD;  Location: [place];  Service: Vascular;  Laterality: Right;  . PERIPHERAL VASCULAR CATHETERIZATION N/A 12/14/2015   Procedure: Fistulagram;  Surgeon: Conrad [place], MD;  Location: [place] CV LAB;  Service: Cardiovascular;  Laterality: N/A;  . SHOULDER SURGERY    . ULTRASOUND GUIDANCE FOR VASCULAR ACCESS  04/15/2017   Procedure: Ultrasound Guidance For Vascular Access;  Surgeon: Angelia Mould, MD;  Location: [place] CV LAB;  Service: Cardiovascular;;  . UPPER EXTREMITY VENOGRAPHY Bilateral 12/17/2016   Procedure: Bilateral Upper Extremity Venography;  Surgeon: Serafina Mitchell, MD;  Location: [place] CV LAB;  Service: Cardiovascular;  Laterality: Bilateral;   Social History   Occupational History  . Occupation: retired, Licensed conveyancer    Employer: RETIRED  Tobacco Use  . Smoking status: Former Smoker    Last attempt to quit: 04/29/2004    Years since quitting: 14.2  . Smokeless tobacco: Former Systems developer    Types: Chew     Quit date: 01/16/1987  . Tobacco comment: quit 2006  Substance and Sexual Activity  . Alcohol use: No  . Drug use: No  . Sexual activity: Not on file

## 2018-07-24 DIAGNOSIS — E611 Iron deficiency: Secondary | ICD-10-CM | POA: Diagnosis not present

## 2018-07-24 DIAGNOSIS — N2581 Secondary hyperparathyroidism of renal origin: Secondary | ICD-10-CM | POA: Diagnosis not present

## 2018-07-24 DIAGNOSIS — N186 End stage renal disease: Secondary | ICD-10-CM | POA: Diagnosis not present

## 2018-07-24 DIAGNOSIS — D509 Iron deficiency anemia, unspecified: Secondary | ICD-10-CM | POA: Diagnosis not present

## 2018-07-24 DIAGNOSIS — A419 Sepsis, unspecified organism: Secondary | ICD-10-CM | POA: Diagnosis not present

## 2018-07-24 DIAGNOSIS — Z992 Dependence on renal dialysis: Secondary | ICD-10-CM | POA: Diagnosis not present

## 2018-07-25 DIAGNOSIS — Z4781 Encounter for orthopedic aftercare following surgical amputation: Secondary | ICD-10-CM | POA: Diagnosis not present

## 2018-07-27 DIAGNOSIS — N186 End stage renal disease: Secondary | ICD-10-CM | POA: Diagnosis not present

## 2018-07-27 DIAGNOSIS — Z992 Dependence on renal dialysis: Secondary | ICD-10-CM | POA: Diagnosis not present

## 2018-07-27 DIAGNOSIS — A419 Sepsis, unspecified organism: Secondary | ICD-10-CM | POA: Diagnosis not present

## 2018-07-27 DIAGNOSIS — E611 Iron deficiency: Secondary | ICD-10-CM | POA: Diagnosis not present

## 2018-07-27 DIAGNOSIS — N2581 Secondary hyperparathyroidism of renal origin: Secondary | ICD-10-CM | POA: Diagnosis not present

## 2018-07-27 DIAGNOSIS — D509 Iron deficiency anemia, unspecified: Secondary | ICD-10-CM | POA: Diagnosis not present

## 2018-07-28 DIAGNOSIS — Z992 Dependence on renal dialysis: Secondary | ICD-10-CM | POA: Diagnosis not present

## 2018-07-28 DIAGNOSIS — N186 End stage renal disease: Secondary | ICD-10-CM | POA: Diagnosis not present

## 2018-07-29 DIAGNOSIS — N186 End stage renal disease: Secondary | ICD-10-CM | POA: Diagnosis not present

## 2018-07-29 DIAGNOSIS — D509 Iron deficiency anemia, unspecified: Secondary | ICD-10-CM | POA: Diagnosis not present

## 2018-07-29 DIAGNOSIS — N2581 Secondary hyperparathyroidism of renal origin: Secondary | ICD-10-CM | POA: Diagnosis not present

## 2018-07-29 DIAGNOSIS — Z992 Dependence on renal dialysis: Secondary | ICD-10-CM | POA: Diagnosis not present

## 2018-07-29 DIAGNOSIS — D631 Anemia in chronic kidney disease: Secondary | ICD-10-CM | POA: Diagnosis not present

## 2018-07-29 DIAGNOSIS — E611 Iron deficiency: Secondary | ICD-10-CM | POA: Diagnosis not present

## 2018-07-31 DIAGNOSIS — N2581 Secondary hyperparathyroidism of renal origin: Secondary | ICD-10-CM | POA: Diagnosis not present

## 2018-07-31 DIAGNOSIS — N186 End stage renal disease: Secondary | ICD-10-CM | POA: Diagnosis not present

## 2018-07-31 DIAGNOSIS — D509 Iron deficiency anemia, unspecified: Secondary | ICD-10-CM | POA: Diagnosis not present

## 2018-07-31 DIAGNOSIS — D631 Anemia in chronic kidney disease: Secondary | ICD-10-CM | POA: Diagnosis not present

## 2018-07-31 DIAGNOSIS — Z992 Dependence on renal dialysis: Secondary | ICD-10-CM | POA: Diagnosis not present

## 2018-07-31 DIAGNOSIS — E611 Iron deficiency: Secondary | ICD-10-CM | POA: Diagnosis not present

## 2018-08-02 ENCOUNTER — Emergency Department (HOSPITAL_COMMUNITY): Payer: Medicare Other

## 2018-08-02 ENCOUNTER — Other Ambulatory Visit: Payer: Self-pay

## 2018-08-02 ENCOUNTER — Encounter (HOSPITAL_COMMUNITY): Payer: Self-pay | Admitting: Emergency Medicine

## 2018-08-02 ENCOUNTER — Emergency Department (HOSPITAL_COMMUNITY)
Admission: EM | Admit: 2018-08-02 | Discharge: 2018-08-02 | Disposition: A | Discharge status: Home / Self Care | Payer: Medicare Other | Attending: Emergency Medicine | Admitting: Emergency Medicine

## 2018-08-02 DIAGNOSIS — Z992 Dependence on renal dialysis: Secondary | ICD-10-CM | POA: Insufficient documentation

## 2018-08-02 DIAGNOSIS — Z79899 Other long term (current) drug therapy: Secondary | ICD-10-CM | POA: Diagnosis not present

## 2018-08-02 DIAGNOSIS — R0602 Shortness of breath: Secondary | ICD-10-CM | POA: Insufficient documentation

## 2018-08-02 DIAGNOSIS — N186 End stage renal disease: Secondary | ICD-10-CM | POA: Insufficient documentation

## 2018-08-02 DIAGNOSIS — J810 Acute pulmonary edema: Secondary | ICD-10-CM

## 2018-08-02 DIAGNOSIS — I120 Hypertensive chronic kidney disease with stage 5 chronic kidney disease or end stage renal disease: Secondary | ICD-10-CM | POA: Insufficient documentation

## 2018-08-02 DIAGNOSIS — Z89021 Acquired absence of right finger(s): Secondary | ICD-10-CM | POA: Diagnosis not present

## 2018-08-02 DIAGNOSIS — Z959 Presence of cardiac and vascular implant and graft, unspecified: Secondary | ICD-10-CM | POA: Diagnosis not present

## 2018-08-02 DIAGNOSIS — F17220 Nicotine dependence, chewing tobacco, uncomplicated: Secondary | ICD-10-CM | POA: Insufficient documentation

## 2018-08-02 DIAGNOSIS — Z8673 Personal history of transient ischemic attack (TIA), and cerebral infarction without residual deficits: Secondary | ICD-10-CM | POA: Diagnosis not present

## 2018-08-02 DIAGNOSIS — R918 Other nonspecific abnormal finding of lung field: Secondary | ICD-10-CM | POA: Diagnosis not present

## 2018-08-02 LAB — BASIC METABOLIC PANEL
Anion gap: 11 (ref 5–15)
BUN: 25 mg/dL — ABNORMAL HIGH (ref 8–23)
CO2: 26 mmol/L (ref 22–32)
Calcium: 8.8 mg/dL — ABNORMAL LOW (ref 8.9–10.3)
Chloride: 103 mmol/L (ref 98–111)
Creatinine, Ser: 7.46 mg/dL — ABNORMAL HIGH (ref 0.61–1.24)
GFR calc Af Amer: 7 mL/min — ABNORMAL LOW (ref >60)
GFR calc non Af Amer: 6 mL/min — ABNORMAL LOW (ref >60)
Glucose, Bld: 97 mg/dL (ref 70–99)
Potassium: 3.1 mmol/L — ABNORMAL LOW (ref 3.5–5.1)
Sodium: 140 mmol/L (ref 135–145)

## 2018-08-02 LAB — CBC WITH DIFFERENTIAL/PLATELET
Abs Immature Granulocytes: 0.03 10*3/uL (ref 0.00–0.07)
Basophils Absolute: 0.1 10*3/uL (ref 0.0–0.1)
Basophils Relative: 1 %
Eosinophils Absolute: 0.5 10*3/uL (ref 0.0–0.5)
Eosinophils Relative: 6 %
HCT: 33.1 % — ABNORMAL LOW (ref 39.0–52.0)
Hemoglobin: 10.2 g/dL — ABNORMAL LOW (ref 13.0–17.0)
Immature Granulocytes: 0 %
Lymphocytes Relative: 14 %
Lymphs Abs: 1.3 10*3/uL (ref 0.7–4.0)
MCH: 20.2 pg — ABNORMAL LOW (ref 26.0–34.0)
MCHC: 30.8 g/dL (ref 30.0–36.0)
MCV: 65.5 fL — ABNORMAL LOW (ref 80.0–100.0)
Monocytes Absolute: 0.5 10*3/uL (ref 0.1–1.0)
Monocytes Relative: 6 %
Neutro Abs: 6.9 10*3/uL (ref 1.7–7.7)
Neutrophils Relative %: 73 %
Platelets: 241 10*3/uL (ref 150–400)
RBC: 5.05 MIL/uL (ref 4.22–5.81)
RDW: 21 % — ABNORMAL HIGH (ref 11.5–15.5)
WBC: 9.4 10*3/uL (ref 4.0–10.5)
nRBC: 0 % (ref 0.0–0.2)

## 2018-08-02 MED ORDER — IOHEXOL 350 MG/ML SOLN
100.0000 mL | Freq: Once | INTRAVENOUS | Status: AC | PRN
  Administered 2018-08-02: 10:00:00 100 mL via INTRAVENOUS

## 2018-08-02 MED ORDER — ALBUTEROL SULFATE (2.5 MG/3ML) 0.083% IN NEBU
5.0000 mg | INHALATION_SOLUTION | Freq: Once | RESPIRATORY_TRACT | Status: AC
  Administered 2018-08-02: 5 mg via RESPIRATORY_TRACT
  Filled 2018-08-02: qty 6

## 2018-08-02 NOTE — ED Notes (Signed)
Pt. Ambulated well and maintained a 98-100% Oxygen saturation. Pt. Was very winded upon returning to the stretcher.

## 2018-08-02 NOTE — Consult Note (Signed)
[facility] Medical Consultation  Joseph RUNYON Sr. FTD:[id number] DOB: 1940-12-15 DOA: 08/02/2018 PCP: [name], MD   Requesting physician: Dr. Laverta Baltimore Date of consultation: 08/02/18 Reason for consultation: Shortness of breath  Impression/Recommendations 1-shortness of breath: In the setting of acute on chronic diastolic heart failure. -Patient with very mild increased vascular congestion appreciated on CT angiogram -Good oxygen saturation on room air and hemodynamically stable -At this moment no requiring emergent dialysis -Educated about importance of following low-sodium diet and to pursuit dialysis as an outpatient. -Extensive discussion regarding red flags that can trigger return to the hospital for emergency treatment. -All questions were answered and the patient was very appreciative on information and honesty for treatment.  *The rest of his medical problems remain stable and he will continue taking medications as prior to admission to maintain good control of his conditions.  Please contact me if I can be of assistance in the meanwhile. Thank you for this consultation.  At this moment will feel that would be safe for the patient to go home and pursued dialysis as already planned/scheduled tomorrow morning.  There is no need for emergency treatment.  Patient also expressing not looking to pay any out-of-pocket co-pays for observation status.  Chief Complaint: Shortness of breath.  HPI:  78 year old male with a past medical history significant for hypertension, hyperlipidemia, end-stage renal disease on hemodialysis Monday Wednesday and Friday, history of aortic stenosis and anemia of chronic kidney disease; who presented to the emergency department secondary to shortness of breath for the last 2 days.  Patient expressed shortness of breath mainly present on exertion and local weight while resting.  He denies chest pain, coughing, fever, chills, sick contacts, nausea, vomiting,  melena, hematochezia, productive cough or any other complaints. Work-up in the ED demonstrated mild pulmonary edema/vascular congestion on CT angiogram of the chest.  No pulmonary embolism.  Stable vital signs, in no acute distress and with good oxygen saturation on room air.  Of note, patient expressed recent diet indiscretion regarding sodium intake after his last dialysis treatment.  Review of Systems:  Negative except as otherwise mentioned in HPI.  Past Medical History:  Diagnosis Date  . Anxiety   . Aortic stenosis   . Arthritis   . CVA (cerebral infarction)   . ESRD (end stage renal disease) on dialysis [facility])    M/W/F at [facility] in [place]  . Essential hypertension    resolved with HD  . Gangrene of right foot ([place])   . Gastric ulcer 2004  . History of cardiomyopathy    LVEF normal as of February 2017  . History of stroke    Left side weakness  . Iron deficiency anemia    Past Surgical History:  Procedure Laterality Date  . ABDOMINAL AORTAGRAM N/A 01/24/2012   Procedure: ABDOMINAL Maxcine Ham;  Surgeon: Elam Dutch, MD;  Location: [facility] CATH LAB;  Service: Cardiovascular;  Laterality: N/A;  . ABDOMINAL AORTOGRAM W/LOWER EXTREMITY N/A 04/15/2017   Procedure: ABDOMINAL AORTOGRAM W/LOWER EXTREMITY;  Surgeon: Angelia Mould, MD;  Location: [place] CV LAB;  Service: Cardiovascular;  Laterality: N/A;  . AMPUTATION Right 05/09/2017   Procedure: RIGHT TRANSMETATARSAL AMPUTATION;  Surgeon: [name], MD;  Location: [place];  Service: Orthopedics;  Laterality: Right;  . AMPUTATION Right 06/13/2018   Procedure: AMPUTATION RIGHT LONG FINGER;  Surgeon: Dayna Barker, MD;  Location: [place];  Service: Plastics;  Laterality: Right;  . ARTERIOVENOUS GRAFT PLACEMENT Right right arm  . AV FISTULA PLACEMENT  Left 08/31/2015   Procedure: ARTERIOVENOUS (AV) FISTULA CREATION- LEFT RADIOCEPHALIC;  Surgeon: Mal Misty, MD;  Location: [place];  Service: Vascular;  Laterality: Left;   . AV FISTULA PLACEMENT Right 02/18/2017   Procedure: INSERTION OF ARTERIOVENOUS (AV) GORE-TEX GRAFT  RIGHT UPPER ARM;  Surgeon: Conrad Munford, MD;  Location: [place];  Service: Vascular;  Laterality: Right;  . BASCILIC VEIN TRANSPOSITION Left 12/19/2015   Procedure: FIRST STAGE BRACHIAL VEIN TRANSPOSITION;  Surgeon: Conrad Custer, MD;  Location: [place];  Service: Vascular;  Laterality: Left;  . BASCILIC VEIN TRANSPOSITION Left 02/08/2016   Procedure: SECOND STAGE BRACHIAL VEIN TRANSPOSITION;  Surgeon: Conrad Grayson, MD;  Location: [place];  Service: Vascular;  Laterality: Left;  . CARDIAC CATHETERIZATION N/A 07/11/2015   Procedure: Left Heart Cath and Coronary Angiography;  Surgeon: Troy Sine, MD;  Location: [place] CV LAB;  Service: Cardiovascular;  Laterality: N/A;  . Carpel Tunnel Left Dec. 22, 2016  . CHOLECYSTECTOMY    . COLONOSCOPY  2004   Dr. Irving Shows, left sided diverticula and cecal polyp, path unknown  . COLONOSCOPY  10/29/2011   Procedure: COLONOSCOPY;  Surgeon: Daneil Dolin, MD;  [facility];  Service: Endoscopy;  Laterality: N/A;  10:15  . ESOPHAGOGASTRODUODENOSCOPY  11/2002   Dr. Gala Romney, erosive reflux esophagitis, multiple gastric ulcer and antral/bulbar erosions. Serologies positive for H.Pylori and was treated  . ESOPHAGOGASTRODUODENOSCOPY  11/20014   Dr. Gala Romney, small hh only, ulcers healed  . ESOPHAGOGASTRODUODENOSCOPY  09/21/2011   Dr Trevor Iha [facility], antral erosions, ?early GAVE  . FISTULOGRAM Left 12/10/2016   Procedure: THROMBECTOMY OF LEFT ARM ARTERIOVENOUS FISTULA;  Surgeon: Waynetta Sandy, MD;  Location: [place];  Service: Vascular;  Laterality: Left;  . INSERTION OF DIALYSIS CATHETER Left 12/10/2016   Procedure: INSERTION OF TUNNELED DIALYSIS CATHETER;  Surgeon: Waynetta Sandy, MD;  Location: [place];  Service: Vascular;  Laterality: Left;  . INSERTION OF DIALYSIS CATHETER Right 06/09/2018   Procedure: INSERTION OF DIALYSIS CATHETER Right  subclavian;  Surgeon: Angelia Mould, MD;  Location: [place];  Service: Vascular;  Laterality: Right;  . IR DIALY SHUNT INTRO [place] W/IMG RIGHT Right 01/01/2018  . IR GENERIC HISTORICAL  07/16/2016   IR REMOVAL TUN CV CATH W/O FL 07/16/2016 Saverio Danker, PA-C [facility]-INTERV RAD  . IR PTA ADDL CENTRAL DIALYSIS SEG THRU DIALY CIRCUIT RIGHT Right 10/21/2017  . IR REMOVAL TUN CV CATH W/O FL  05/12/2017  . IR THROMBECTOMY AV FISTULA W/THROMBOLYSIS/PTA INC/SHUNT/IMG RIGHT Right 10/21/2017  . IR THROMBECTOMY AV FISTULA W/THROMBOLYSIS/PTA INC/SHUNT/IMG RIGHT Right 11/27/2017  . IR US GUIDE VASC ACCESS RIGHT  10/21/2017  . IR US GUIDE VASC ACCESS RIGHT  11/27/2017  . IR US GUIDE VASC ACCESS RIGHT  01/01/2018  . LIGATION ARTERIOVENOUS GORTEX GRAFT Right 06/09/2018  . LIGATION ARTERIOVENOUS GORTEX GRAFT Right 06/09/2018   Procedure: LIGATION ARTERIOVENOUS GORTEX GRAFT RIGHT ARM;  Surgeon: Angelia Mould, MD;  Location: [place];  Service: Vascular;  Laterality: Right;  . LIGATION OF ARTERIOVENOUS  FISTULA Left 12/19/2015   Procedure: LIGATION OF RADIOCEPHALIC ARTERIOVENOUS  FISTULA;  Surgeon: Conrad Limaville, MD;  Location: [place];  Service: Vascular;  Laterality: Left;  Marland Kitchen MASS EXCISION Right 02/18/2017   Procedure: EXCISION OF RIGHT AXILLARY EPIDERMAL INCLUSION CYST;  Surgeon: Conrad Lockhart, MD;  Location: [place];  Service: Vascular;  Laterality: Right;  . PERIPHERAL VASCULAR CATHETERIZATION N/A 12/14/2015   Procedure: Nolon Stalls;  Surgeon: Jannette Fogo  Bridgett Larsson, MD;  Location: [place] CV LAB;  Service: Cardiovascular;  Laterality: N/A;  . SHOULDER SURGERY    . ULTRASOUND GUIDANCE FOR VASCULAR ACCESS  04/15/2017   Procedure: Ultrasound Guidance For Vascular Access;  Surgeon: Angelia Mould, MD;  Location: [place] CV LAB;  Service: Cardiovascular;;  . UPPER EXTREMITY VENOGRAPHY Bilateral 12/17/2016   Procedure: Bilateral Upper Extremity Venography;  Surgeon: Serafina Mitchell, MD;  Location:  [place] CV LAB;  Service: Cardiovascular;  Laterality: Bilateral;   Social History:  reports that he quit smoking about 14 years ago. He quit smokeless tobacco use about 31 years ago.  His smokeless tobacco use included chew. He reports that he does not drink alcohol or use drugs.  Allergies  Allergen Reactions  . Aspirin Other (See Comments)    INTERNAL BLEEDING Causes internal bleeding  Other Reaction: history of ulcers   Family History  Problem Relation Age of Onset  . Hypertension Mother   . Colon cancer Neg Hx   . Liver disease Neg Hx     Prior to Admission medications   Medication Sig Start Date End Date Taking? Authorizing Provider  atorvastatin (LIPITOR) 40 MG tablet Take 40 mg by mouth daily. 11/03/15  Yes [provider]  b complex-vitamin c-folic acid (NEPHRO-VITE) 0.8 MG TABS Take 0.8 mg by mouth See admin instructions. Takes on Tuesdays, Thursdays, Saturdays, and Sundays. Does not take on Mondays, Wednesdays, and Fridays due to Dialysis treatments.   Yes [provider]  cinacalcet (SENSIPAR) 60 MG tablet Take 60 mg by mouth every evening. With evening meal   Yes [provider]  HYDROcodone-acetaminophen (NORCO/VICODIN) 5-325 MG tablet Take 1 tablet by mouth every 6 (six) hours as needed for moderate pain. 06/18/18  Yes Ghimire, Henreitta Leber, MD  nitroGLYCERIN (NITRODUR - DOSED IN MG/24 HR) 0.2 mg/hr patch Place 1 patch (0.2 mg total) onto the skin daily. Apply 1 patch to dorsum of foot daily. 05/05/18  Yes Rayburn, Neta Mends, PA-C  Nutritional Supplements (FEEDING SUPPLEMENT, NEPRO CARB STEADY,) LIQD Take 237 mLs by mouth 2 (two) times daily between meals. 06/18/18  Yes Ghimire, Henreitta Leber, MD  sevelamer (RENVELA) 800 MG tablet Take 1,600-3,200 mg by mouth See admin instructions. Take 3200  mg by mouth 3 times daily with full meals and take 1600 mg by mouth with snacks.   Yes [provider]   Physical Exam: Blood pressure (!) 164/95,  pulse 91, temperature 97.8 F (36.6 C), temperature source Oral, resp. rate 19, height 5\' 5"  (1.651 m), weight 77.1 kg, SpO2 98 %. Vitals:   08/02/18 1116 08/02/18 1200  BP: (!) 166/90 (!) 164/95  Pulse: (!) 101 91  Resp: 20 19  Temp: 97.8 F (36.6 C)   SpO2: 100% 98%     General: Afebrile, no chest pain, no nausea, no vomiting, good oxygen saturation on room air and able to speak in full sentences.  Eyes: PERRLA, no icterus, no nystagmus  ENT: No ears or nostrils discharges; no erythema appreciated in his pharynx.  Neck: No JVD.  Cardiovascular: S1 and S2, no rubs, no gallops  Respiratory: Decreased breath sounds at the bases, no frank crackles, no wheezing, no using accessory muscles.  Normal respiratory effort.  Abdomen: Soft, nontender, nondistended, positive bowel sounds  Skin: No erythema, Mild swelling appreciated on his right hand; positive right long finger amputation; no drainage or signs of superimposed infection.  Musculoskeletal: No cyanosis or clubbing.  Psychiatric: No acute hallucinations or suicidal  ideation; oriented x3; stable mood.  Neurologic: No focal weakness, cranial nerves grossly intact, moving 4 limbs subcutaneously.  Labs on Admission:  Basic Metabolic Panel: Recent Labs  Lab 08/02/18 0525  NA 140  K 3.1*  CL 103  CO2 26  GLUCOSE 97  BUN 25*  CREATININE 7.46*  CALCIUM 8.8*   CBC: Recent Labs  Lab 08/02/18 0525  WBC 9.4  NEUTROABS 6.9  HGB 10.2*  HCT 33.1*  MCV 65.5*  PLT 241    Radiological Exams on Admission: Dg Chest 2 View  Result Date: 08/02/2018 CLINICAL DATA:  Shortness of breath since yesterday morning. EXAM: CHEST - 2 VIEW COMPARISON:  06/09/2018 and 03/31/2017 FINDINGS: There are thickened bronchovascular markings and thickened interstitial markings, the latter most evident in the lower lungs. More discrete opacities noted at the left lung base consistent with scarring or chronic atelectasis. These findings are  similar to the study dated 03/31/2017. No convincing pneumonia or pulmonary edema. No pleural effusion or pneumothorax. Cardiac silhouette is normal in size. No mediastinal or hilar masses or convincing adenopathy. Right sided tunneled central venous catheter has its tip projecting in the right atrium. Skeletal structures are demineralized. Stable intramedullary rod in the proximal right humerus. No acute findings. IMPRESSION: 1. No acute cardiopulmonary disease. 2. Thickened bronchovascular markings and interstitial markings are chronic, stable from prior studies dating to 03/31/2017. Electronically Signed   By: Lajean Manes M.D.   On: 08/02/2018 06:21   Ct Angio Chest Pe W/cm &/or Wo Cm  Result Date: 08/02/2018 CLINICAL DATA:  Two days of shortness of breath. Long-standing hemodialysis. EXAM: CT ANGIOGRAPHY CHEST WITH CONTRAST TECHNIQUE: Multidetector CT imaging of the chest was performed using the standard protocol during bolus administration of intravenous contrast. Multiplanar CT image reconstructions and MIPs were obtained to evaluate the vascular anatomy. CONTRAST:  128mL OMNIPAQUE IOHEXOL 350 MG/ML SOLN COMPARISON:  Abdominal CT 07/16/2012 FINDINGS: Cardiovascular: Normal heart size. No pericardial effusion. Negative for pulmonary embolism. Extensive atherosclerotic calcification of the aorta and coronaries. Mediastinum/Nodes: Negative for adenopathy or mass Lungs/Pleura: Generalized airway thickening with Kerley lines and small pleural effusions. Mild atelectasis. No consolidation or worrisome ground-glass opacity Upper Abdomen: The visualized kidneys show lesions/cysts of various complexity correlating with history of longstanding end-stage renal disease. Long-standing, benign mass associated with inferior right liver with dystrophic calcification. Cholecystectomy clips. Musculoskeletal: Spondylosis with multi-level ankylosis. Generalized osteopenia. Advanced bilateral glenohumeral osteoarthritis with  joint effusion and bursal distension on the right. Question renal osteodystrophy at the level of the acromioclavicular joints. 23 mm lucency in the T10 vertebral body and right pedicle is well-defined benign-appearing but was not seen on abdominal CT in 2014. T2 hyperintense lesion was seen in this area on 2016 abdominal MRI Gynecomastia Nonenhancing graft in the subcutaneous right arm. Presumed dermal inclusion cyst in the subcutaneous lateral right chest wall. Review of the MIP images confirms the above findings. IMPRESSION: 1. Pulmonary edema/CHF with trace pleural effusion. Negative for pulmonary embolism. 2. Lucent lesion in the T10 body not seen by CT in 2014 but present (and smaller) on 2016 abdominal MRI. Question brown tumor given changes of renal osteodystrophy. Would also correlate with serum protein, although would expect a more aggressive course for myeloma/plasmacytoma. 3. Multiple chronic findings which are described above. Electronically Signed   By: Monte Fantasia M.D.   On: 08/02/2018 10:59   Time spent: 30 minutes  Barton Dubois [facility] Pager [phone]   08/02/2018, 1:46 PM

## 2018-08-02 NOTE — ED Provider Notes (Signed)
[facility] EMERGENCY DEPARTMENT Provider Note   CSN: [id number] Arrival date & time: 08/02/18  7494  Time seen 5:25 AM  History   Chief Complaint Chief Complaint  Patient presents with  . Shortness of Breath    HPI [name]. is a 78 y.o. male.     HPI patient states he has end-stage renal disease and has been a dialysis patient for 22 years.  He goes to [facility] on [address].  He normally gets dialyzed on Monday, tomorrow, Wednesday, and Friday.  He states he did not miss any dialysis last week.  He states that Saturday morning, April 4 he started getting short of breath and it was mainly dyspnea on exertion.  He states if he would sit and rest it would go away.  He states he has a chronic cough is not changed.  He states that was from smoking years ago.  He denies any fever.  He denies any chest pain or swelling of his legs.  He states he normally does not feel this way because he does not normally miss dialysis.  He states he ate a meatball sub-on Friday after his dialysis.  Yesterday he ate a tenderloin biscuit from a gas station restaurant and canned peaches.  PCP [name], MD   Past Medical History:  Diagnosis Date  . Anxiety   . Aortic stenosis   . Arthritis   . CVA (cerebral infarction)   . ESRD (end stage renal disease) on dialysis [facility])    M/W/F at [facility] in [place]  . Essential hypertension    resolved with HD  . Gangrene of right foot ([place])   . Gastric ulcer 2004  . History of cardiomyopathy    LVEF normal as of February 2017  . History of stroke    Left side weakness  . Iron deficiency anemia     Patient Active Problem List   Diagnosis Date Noted  . Malnutrition of moderate degree 06/18/2018  . Severe sepsis ([place]) 06/12/2018  . Leukocytosis 06/12/2018  . MRSA (methicillin resistant staph aureus) culture positive 06/12/2018  . Essential hypertension 06/12/2018  . ESRD (end stage renal disease) ([place]) 06/09/2018  . Gangrene of finger ([place])  06/04/2018  . S/P transmetatarsal amputation of foot, right ([place]) 05/09/2017  . PVD (peripheral vascular disease) ([place]) 04/15/2017  . Nonischemic cardiomyopathy ([place]) 08/15/2015  . Aortic stenosis 08/15/2015  . Moderate aortic stenosis 07/12/2015  . 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 CAD- 40-50% LAD at cath 07/11/15 07/11/2015  . Aspirin intolerance 07/11/2015  . Abnormal stress test   . ESRD on dialysis ([place]) 05/30/2015  . CTS (carpal tunnel syndrome) 02/28/2015  . PVD of LE - Dr Oneida Alar follows 08/20/2012  . Midfoot skin ulcer, right, limited to breakdown of skin ([place]) 01/16/2012  . Encounter for screening colonoscopy 10/08/2011  . Melena 10/08/2011  . Other complications due to renal dialysis device, implant, and graft 09/24/2011    Past Surgical History:  Procedure Laterality Date  . ABDOMINAL AORTAGRAM N/A 01/24/2012   Procedure: ABDOMINAL Maxcine Ham;  Surgeon: Elam Dutch, MD;  Location: [facility] CATH LAB;  Service: Cardiovascular;  Laterality: N/A;  . ABDOMINAL AORTOGRAM W/LOWER EXTREMITY N/A 04/15/2017   Procedure: ABDOMINAL AORTOGRAM W/LOWER EXTREMITY;  Surgeon: Angelia Mould, MD;  Location: [place] CV LAB;  Service: Cardiovascular;  Laterality: N/A;  . AMPUTATION Right 05/09/2017   Procedure: RIGHT TRANSMETATARSAL AMPUTATION;  Surgeon: [name], MD;  Location: [place];  Service:  Orthopedics;  Laterality: Right;  . AMPUTATION Right 06/13/2018   Procedure: AMPUTATION RIGHT LONG FINGER;  Surgeon: Dayna Barker, MD;  Location: [place];  Service: Plastics;  Laterality: Right;  . ARTERIOVENOUS GRAFT PLACEMENT Right right arm  . AV FISTULA PLACEMENT Left 08/31/2015   Procedure: ARTERIOVENOUS (AV) FISTULA CREATION- LEFT RADIOCEPHALIC;  Surgeon: Mal Misty, MD;  Location: [place];  Service: Vascular;  Laterality: Left;  . AV FISTULA PLACEMENT Right 02/18/2017   Procedure: INSERTION OF ARTERIOVENOUS (AV) GORE-TEX GRAFT  RIGHT UPPER ARM;  Surgeon: Conrad Nanty-Glo, MD;  Location: [place];  Service: Vascular;  Laterality: Right;  . BASCILIC VEIN TRANSPOSITION Left 12/19/2015   Procedure: FIRST STAGE BRACHIAL VEIN TRANSPOSITION;  Surgeon: Conrad Riverwoods, MD;  Location: [place];  Service: Vascular;  Laterality: Left;  . BASCILIC VEIN TRANSPOSITION Left 02/08/2016   Procedure: SECOND STAGE BRACHIAL VEIN TRANSPOSITION;  Surgeon: Conrad Hillandale, MD;  Location: [place];  Service: Vascular;  Laterality: Left;  . CARDIAC CATHETERIZATION N/A 07/11/2015   Procedure: Left Heart Cath and Coronary Angiography;  Surgeon: Troy Sine, MD;  Location: [place] CV LAB;  Service: Cardiovascular;  Laterality: N/A;  . Carpel Tunnel Left Dec. 22, 2016  . CHOLECYSTECTOMY    . COLONOSCOPY  2004   Dr. Irving Shows, left sided diverticula and cecal polyp, path unknown  . COLONOSCOPY  10/29/2011   Procedure: COLONOSCOPY;  Surgeon: Daneil Dolin, MD;  [facility];  Service: Endoscopy;  Laterality: N/A;  10:15  . ESOPHAGOGASTRODUODENOSCOPY  11/2002   Dr. Gala Romney, erosive reflux esophagitis, multiple gastric ulcer and antral/bulbar erosions. Serologies positive for H.Pylori and was treated  . ESOPHAGOGASTRODUODENOSCOPY  11/20014   Dr. Gala Romney, small hh only, ulcers healed  . ESOPHAGOGASTRODUODENOSCOPY  09/21/2011   Dr Trevor Iha [facility], antral erosions, ?early GAVE  . FISTULOGRAM Left 12/10/2016   Procedure: THROMBECTOMY OF LEFT ARM ARTERIOVENOUS FISTULA;  Surgeon: Waynetta Sandy, MD;  Location: [place];  Service: Vascular;  Laterality: Left;  . INSERTION OF DIALYSIS CATHETER Left 12/10/2016   Procedure: INSERTION OF TUNNELED DIALYSIS CATHETER;  Surgeon: Waynetta Sandy, MD;  Location: [place];  Service: Vascular;  Laterality: Left;  . INSERTION OF DIALYSIS CATHETER Right 06/09/2018   Procedure: INSERTION OF DIALYSIS CATHETER Right subclavian;  Surgeon: Angelia Mould, MD;  Location: [place];  Service: Vascular;  Laterality: Right;  . IR DIALY SHUNT INTRO  [place] W/IMG RIGHT Right 01/01/2018  . IR GENERIC HISTORICAL  07/16/2016   IR REMOVAL TUN CV CATH W/O FL 07/16/2016 Saverio Danker, PA-C [facility]-INTERV RAD  . IR PTA ADDL CENTRAL DIALYSIS SEG THRU DIALY CIRCUIT RIGHT Right 10/21/2017  . IR REMOVAL TUN CV CATH W/O FL  05/12/2017  . IR THROMBECTOMY AV FISTULA W/THROMBOLYSIS/PTA INC/SHUNT/IMG RIGHT Right 10/21/2017  . IR THROMBECTOMY AV FISTULA W/THROMBOLYSIS/PTA INC/SHUNT/IMG RIGHT Right 11/27/2017  . IR US GUIDE VASC ACCESS RIGHT  10/21/2017  . IR US GUIDE VASC ACCESS RIGHT  11/27/2017  . IR US GUIDE VASC ACCESS RIGHT  01/01/2018  . LIGATION ARTERIOVENOUS GORTEX GRAFT Right 06/09/2018  . LIGATION ARTERIOVENOUS GORTEX GRAFT Right 06/09/2018   Procedure: LIGATION ARTERIOVENOUS GORTEX GRAFT RIGHT ARM;  Surgeon: Angelia Mould, MD;  Location: [place];  Service: Vascular;  Laterality: Right;  . LIGATION OF ARTERIOVENOUS  FISTULA Left 12/19/2015   Procedure: LIGATION OF RADIOCEPHALIC ARTERIOVENOUS  FISTULA;  Surgeon: Conrad Sea Bright, MD;  Location: [place];  Service: Vascular;  Laterality: Left;  Marland Kitchen MASS  EXCISION Right 02/18/2017   Procedure: EXCISION OF RIGHT AXILLARY EPIDERMAL INCLUSION CYST;  Surgeon: Conrad Midpines, MD;  Location: [place];  Service: Vascular;  Laterality: Right;  . PERIPHERAL VASCULAR CATHETERIZATION N/A 12/14/2015   Procedure: Fistulagram;  Surgeon: Conrad Loachapoka, MD;  Location: [place] CV LAB;  Service: Cardiovascular;  Laterality: N/A;  . SHOULDER SURGERY    . ULTRASOUND GUIDANCE FOR VASCULAR ACCESS  04/15/2017   Procedure: Ultrasound Guidance For Vascular Access;  Surgeon: Angelia Mould, MD;  Location: [place] CV LAB;  Service: Cardiovascular;;  . UPPER EXTREMITY VENOGRAPHY Bilateral 12/17/2016   Procedure: Bilateral Upper Extremity Venography;  Surgeon: Serafina Mitchell, MD;  Location: [place] CV LAB;  Service: Cardiovascular;  Laterality: Bilateral;        Home Medications    Prior to Admission  medications   Medication Sig Start Date End Date Taking? Authorizing Provider  atorvastatin (LIPITOR) 40 MG tablet Take 40 mg by mouth daily. 11/03/15  Yes [provider]  b complex-vitamin c-folic acid (NEPHRO-VITE) 0.8 MG TABS Take 0.8 mg by mouth See admin instructions. Takes on Tuesdays, Thursdays, Saturdays, and Sundays. Does not take on Mondays, Wednesdays, and Fridays due to Dialysis treatments.   Yes [provider]  cinacalcet (SENSIPAR) 60 MG tablet Take 60 mg by mouth every evening. With evening meal   Yes [provider]  HYDROcodone-acetaminophen (NORCO/VICODIN) 5-325 MG tablet Take 1 tablet by mouth every 6 (six) hours as needed for moderate pain. 06/18/18  Yes Ghimire, Henreitta Leber, MD  nitroGLYCERIN (NITRODUR - DOSED IN MG/24 HR) 0.2 mg/hr patch Place 1 patch (0.2 mg total) onto the skin daily. Apply 1 patch to dorsum of foot daily. 05/05/18  Yes Rayburn, Neta Mends, PA-C  Nutritional Supplements (FEEDING SUPPLEMENT, NEPRO CARB STEADY,) LIQD Take 237 mLs by mouth 2 (two) times daily between meals. 06/18/18  Yes Ghimire, Henreitta Leber, MD  sevelamer (RENVELA) 800 MG tablet Take 1,600-3,200 mg by mouth See admin instructions. Take 3200  mg by mouth 3 times daily with full meals and take 1600 mg by mouth with snacks.   Yes [provider]    Family History Family History  Problem Relation Age of Onset  . Hypertension Mother   . Colon cancer Neg Hx   . Liver disease Neg Hx     Social History Social History   Tobacco Use  . Smoking status: Former Smoker    Last attempt to quit: 04/29/2004    Years since quitting: 14.2  . Smokeless tobacco: Former Systems developer    Types: Chew    Quit date: 01/16/1987  . Tobacco comment: quit 2006  Substance Use Topics  . Alcohol use: No  . Drug use: No     Allergies   Aspirin   Review of Systems Review of Systems  All other systems reviewed and are negative.    Physical Exam Updated Vital Signs BP (!) 164/98    Pulse 83   Temp 97.9 F (36.6 C) (Oral)   Resp 16   Ht _0  (1.651 m)   Wt 77.1 kg   SpO2 100%   BMI 28.29 kg/m   Vital signs normal except hypertension   Physical Exam Vitals signs and nursing note reviewed.  Constitutional:      General: He is not in acute distress.    Appearance: He is well-developed.  HENT:     Head: Normocephalic and atraumatic.     Nose: Nose normal.  Mouth/Throat:     Mouth: Mucous membranes are dry.  Eyes:     Extraocular Movements: Extraocular movements intact.     Conjunctiva/sclera: Conjunctivae normal.     Pupils: Pupils are equal, round, and reactive to light.  Neck:     Musculoskeletal: Normal range of motion and neck supple.  Cardiovascular:     Rate and Rhythm: Normal rate and regular rhythm.  Pulmonary:     Effort: Pulmonary effort is normal. No respiratory distress.     Breath sounds: Normal breath sounds. No wheezing, rhonchi or rales.  Musculoskeletal: Normal range of motion.        General: No swelling.     Comments: Patient is missing his right middle finger which was amputated about 3 weeks ago.  He has some mild swelling in that hand  Neurological:     General: No focal deficit present.     Mental Status: He is alert and oriented to person, place, and time.     Cranial Nerves: No cranial nerve deficit.  Psychiatric:        Mood and Affect: Mood normal.        Behavior: Behavior normal.        Thought Content: Thought content normal.      ED Treatments / Results  Labs (all labs ordered are listed, but only abnormal results are displayed) Results for orders placed or performed during the hospital encounter of 46/27/03  Basic metabolic panel  Result Value Ref Range   Sodium 140 135 - 145 mmol/L   Potassium 3.1 (L) 3.5 - 5.1 mmol/L   Chloride 103 98 - 111 mmol/L   CO2 26 22 - 32 mmol/L   Glucose, Bld 97 70 - 99 mg/dL   BUN 25 (H) 8 - 23 mg/dL   Creatinine, Ser 7.46 (H) 0.61 - 1.24 mg/dL   Calcium 8.8 (L) 8.9 -  10.3 mg/dL   GFR calc non Af Amer 6 (L) >60 mL/min   GFR calc Af Amer 7 (L) >60 mL/min   Anion gap 11 5 - 15  CBC with Differential  Result Value Ref Range   WBC 9.4 4.0 - 10.5 K/uL   RBC 5.05 4.22 - 5.81 MIL/uL   Hemoglobin 10.2 (L) 13.0 - 17.0 g/dL   HCT 33.1 (L) 39.0 - 52.0 %   MCV 65.5 (L) 80.0 - 100.0 fL   MCH 20.2 (L) 26.0 - 34.0 pg   MCHC 30.8 30.0 - 36.0 g/dL   RDW 21.0 (H) 11.5 - 15.5 %   Platelets 241 150 - 400 K/uL   nRBC 0.0 0.0 - 0.2 %   Neutrophils Relative % 73 %   Neutro Abs 6.9 1.7 - 7.7 K/uL   Lymphocytes Relative 14 %   Lymphs Abs 1.3 0.7 - 4.0 K/uL   Monocytes Relative 6 %   Monocytes Absolute 0.5 0.1 - 1.0 K/uL   Eosinophils Relative 6 %   Eosinophils Absolute 0.5 0.0 - 0.5 K/uL   Basophils Relative 1 %   Basophils Absolute 0.1 0.0 - 0.1 K/uL   Immature Granulocytes 0 %   Abs Immature Granulocytes 0.03 0.00 - 0.07 K/uL   Schistocytes PRESENT    Spherocytes PRESENT    Laboratory interpretation all normal except hypokalemia which is good he is not hyperkalemic, stable anemia    EKG EKG Interpretation  Date/Time:  Sunday August 02 2018 04:52:41 EDT Ventricular Rate:  99 PR Interval:    QRS Duration: 165 QT Interval:  416 QTC Calculation: 534 R Axis:   70 Text Interpretation:  Sinus tachycardia Multiform ventricular premature complexes Right bundle branch block No significant change since last tracing 13 Jun 2018 Confirmed by [name] [phone]) on 08/02/2018 6:16:11 AM   Radiology Dg Chest 2 View  Result Date: 08/02/2018 CLINICAL DATA:  Shortness of breath since yesterday morning. EXAM: CHEST - 2 VIEW COMPARISON:  06/09/2018 and 03/31/2017 FINDINGS: There are thickened bronchovascular markings and thickened interstitial markings, the latter most evident in the lower lungs. More discrete opacities noted at the left lung base consistent with scarring or chronic atelectasis. These findings are similar to the study dated 03/31/2017. No convincing pneumonia  or pulmonary edema. No pleural effusion or pneumothorax. Cardiac silhouette is normal in size. No mediastinal or hilar masses or convincing adenopathy. Right sided tunneled central venous catheter has its tip projecting in the right atrium. Skeletal structures are demineralized. Stable intramedullary rod in the proximal right humerus. No acute findings. IMPRESSION: 1. No acute cardiopulmonary disease. 2. Thickened bronchovascular markings and interstitial markings are chronic, stable from prior studies dating to 03/31/2017. Electronically Signed   By: Lajean Manes M.D.   On: 08/02/2018 06:21    Procedures Procedures (including critical care time)  Medications Ordered in ED Medications  albuterol (PROVENTIL) (2.5 MG/3ML) 0.083% nebulizer solution 5 mg (5 mg Nebulization Given 08/02/18 0729)     Initial Impression / Assessment and Plan / ED Course  I have reviewed the triage vital signs and the nursing notes.  Pertinent labs & imaging results that were available during my care of the patient were reviewed by me and considered in my medical decision making (see chart for details).    Patient is a dialysis patient who presents with shortness of breath.  EKG was done to look for signs for hyperkalemia, chest x-ray was done to look for evidence of pulmonary edema, and be met was done to also look for hyperkalemia.  Patient was ambulated by nursing staff and they report he ambulated well with pulse ox 98 to 100% however they state when he returned to his room he was short of breath.  Patient has a history of smoking in the past.  I am going to try an albuterol nebulizer to see if that helps with his breathing.   7:25 AM I went to talk to the patient about his test results.  We discussed trying the albuterol nebulizer to see if that would help with his shortness of breath.  Patient does have a history of smoking.  He also told me he has a chronic smoker's cough.  Hopefully this will make him feel  better and he can go home with an albuterol inhaler.  Recheck at 8:30 AM patient was re-ambulated around the emergency department by nursing staff after his nebulizer treatment.  However he said he still got short of breath, he did not feel like the nebulizer helped at all.  When I listen to him his lungs are still clear with maybe some decreased breath sounds in the left base.  At this point he decided do a CTA of his chest to look for blood clot, subclinical pneumonia, or other etiology for his shortness of breath.  Patient is adamant that he has never had this before.  08:45 AM Pt left with Dr Laverta Baltimore to get results of his CTA and disposition.   Final Clinical Impressions(s) / ED Diagnoses   Final diagnoses:  Shortness of breath    Disposition pending  [name], MD, Barbette Or, MD 08/02/18 [phone]

## 2018-08-02 NOTE — ED Triage Notes (Signed)
Pt with c/o sob since yesterday morning.

## 2018-08-02 NOTE — ED Provider Notes (Signed)
Blood pressure (!) 164/98, pulse 83, temperature 97.9 F (36.6 C), temperature source Oral, resp. rate 16, height 5\' 5"  (1.651 m), weight 77.1 kg, SpO2 98 %.  Assuming care from Dr. Tomi Bamberger.  In short, [name]. is a 78 y.o. male with a chief complaint of Shortness of Breath .  Refer to the original H&P for additional details.  The current plan of care is to f/u after CT chest and reassess.  11:10 AM  Spoke with Dr. Jonnie Finner regarding the CT. Pulmonary edema pattern on CT. Patient with increased WOB on ambulation without hypoxemia. HD scheduled for tomorrow at 6AM. He discussed with Dr. Jimmy Footman who recommends obs admit overnight with emergent HD if patient worsens but if not could get routine HD in the AM.   01:31 PM  Spoke with the Hospitalist, Dr. Dyann Kief regarding the patient and Nephrology recommendation. Agree that no indication for emergent HD. See consult note. Patient comfortable with the plan at discharge and agrees to return immediately with any new or suddenly worsening symptoms. Patient understands importance of going to HD in the AM.    Long, Wonda Olds, MD 08/02/18 1427

## 2018-08-02 NOTE — ED Notes (Signed)
Pt ambulated with a steady gait but stated he felt shortness of breath.

## 2018-08-02 NOTE — Discharge Instructions (Signed)
You appear to be short of breath due to fluid in the lungs. You do not require dialysis on an emergency basis today but should present to dialysis first thing tomorrow morning. If you become more short of breath overnight you will need to return to the Emergency Department immediately. You can always call 911 if symptoms are severe.

## 2018-08-03 DIAGNOSIS — Z992 Dependence on renal dialysis: Secondary | ICD-10-CM | POA: Diagnosis not present

## 2018-08-03 DIAGNOSIS — N186 End stage renal disease: Secondary | ICD-10-CM | POA: Diagnosis not present

## 2018-08-03 DIAGNOSIS — D631 Anemia in chronic kidney disease: Secondary | ICD-10-CM | POA: Diagnosis not present

## 2018-08-03 DIAGNOSIS — E611 Iron deficiency: Secondary | ICD-10-CM | POA: Diagnosis not present

## 2018-08-03 DIAGNOSIS — D509 Iron deficiency anemia, unspecified: Secondary | ICD-10-CM | POA: Diagnosis not present

## 2018-08-03 DIAGNOSIS — N2581 Secondary hyperparathyroidism of renal origin: Secondary | ICD-10-CM | POA: Diagnosis not present

## 2018-08-04 DIAGNOSIS — L97521 Non-pressure chronic ulcer of other part of left foot limited to breakdown of skin: Secondary | ICD-10-CM | POA: Diagnosis not present

## 2018-08-04 DIAGNOSIS — I739 Peripheral vascular disease, unspecified: Secondary | ICD-10-CM | POA: Diagnosis not present

## 2018-08-05 DIAGNOSIS — E611 Iron deficiency: Secondary | ICD-10-CM | POA: Diagnosis not present

## 2018-08-05 DIAGNOSIS — D509 Iron deficiency anemia, unspecified: Secondary | ICD-10-CM | POA: Diagnosis not present

## 2018-08-05 DIAGNOSIS — Z992 Dependence on renal dialysis: Secondary | ICD-10-CM | POA: Diagnosis not present

## 2018-08-05 DIAGNOSIS — D631 Anemia in chronic kidney disease: Secondary | ICD-10-CM | POA: Diagnosis not present

## 2018-08-05 DIAGNOSIS — N2581 Secondary hyperparathyroidism of renal origin: Secondary | ICD-10-CM | POA: Diagnosis not present

## 2018-08-05 DIAGNOSIS — N186 End stage renal disease: Secondary | ICD-10-CM | POA: Diagnosis not present

## 2018-08-07 DIAGNOSIS — D509 Iron deficiency anemia, unspecified: Secondary | ICD-10-CM | POA: Diagnosis not present

## 2018-08-07 DIAGNOSIS — N2581 Secondary hyperparathyroidism of renal origin: Secondary | ICD-10-CM | POA: Diagnosis not present

## 2018-08-07 DIAGNOSIS — D631 Anemia in chronic kidney disease: Secondary | ICD-10-CM | POA: Diagnosis not present

## 2018-08-07 DIAGNOSIS — N186 End stage renal disease: Secondary | ICD-10-CM | POA: Diagnosis not present

## 2018-08-07 DIAGNOSIS — Z992 Dependence on renal dialysis: Secondary | ICD-10-CM | POA: Diagnosis not present

## 2018-08-07 DIAGNOSIS — E611 Iron deficiency: Secondary | ICD-10-CM | POA: Diagnosis not present

## 2018-08-10 DIAGNOSIS — Z992 Dependence on renal dialysis: Secondary | ICD-10-CM | POA: Diagnosis not present

## 2018-08-10 DIAGNOSIS — N2581 Secondary hyperparathyroidism of renal origin: Secondary | ICD-10-CM | POA: Diagnosis not present

## 2018-08-10 DIAGNOSIS — E611 Iron deficiency: Secondary | ICD-10-CM | POA: Diagnosis not present

## 2018-08-10 DIAGNOSIS — D631 Anemia in chronic kidney disease: Secondary | ICD-10-CM | POA: Diagnosis not present

## 2018-08-10 DIAGNOSIS — N186 End stage renal disease: Secondary | ICD-10-CM | POA: Diagnosis not present

## 2018-08-10 DIAGNOSIS — D509 Iron deficiency anemia, unspecified: Secondary | ICD-10-CM | POA: Diagnosis not present

## 2018-08-12 DIAGNOSIS — N2581 Secondary hyperparathyroidism of renal origin: Secondary | ICD-10-CM | POA: Diagnosis not present

## 2018-08-12 DIAGNOSIS — E611 Iron deficiency: Secondary | ICD-10-CM | POA: Diagnosis not present

## 2018-08-12 DIAGNOSIS — Z992 Dependence on renal dialysis: Secondary | ICD-10-CM | POA: Diagnosis not present

## 2018-08-12 DIAGNOSIS — N186 End stage renal disease: Secondary | ICD-10-CM | POA: Diagnosis not present

## 2018-08-12 DIAGNOSIS — D509 Iron deficiency anemia, unspecified: Secondary | ICD-10-CM | POA: Diagnosis not present

## 2018-08-12 DIAGNOSIS — D631 Anemia in chronic kidney disease: Secondary | ICD-10-CM | POA: Diagnosis not present

## 2018-08-14 DIAGNOSIS — D631 Anemia in chronic kidney disease: Secondary | ICD-10-CM | POA: Diagnosis not present

## 2018-08-14 DIAGNOSIS — D509 Iron deficiency anemia, unspecified: Secondary | ICD-10-CM | POA: Diagnosis not present

## 2018-08-14 DIAGNOSIS — N2581 Secondary hyperparathyroidism of renal origin: Secondary | ICD-10-CM | POA: Diagnosis not present

## 2018-08-14 DIAGNOSIS — E611 Iron deficiency: Secondary | ICD-10-CM | POA: Diagnosis not present

## 2018-08-14 DIAGNOSIS — Z992 Dependence on renal dialysis: Secondary | ICD-10-CM | POA: Diagnosis not present

## 2018-08-14 DIAGNOSIS — N186 End stage renal disease: Secondary | ICD-10-CM | POA: Diagnosis not present

## 2018-08-17 DIAGNOSIS — N2581 Secondary hyperparathyroidism of renal origin: Secondary | ICD-10-CM | POA: Diagnosis not present

## 2018-08-17 DIAGNOSIS — D509 Iron deficiency anemia, unspecified: Secondary | ICD-10-CM | POA: Diagnosis not present

## 2018-08-17 DIAGNOSIS — N186 End stage renal disease: Secondary | ICD-10-CM | POA: Diagnosis not present

## 2018-08-17 DIAGNOSIS — E611 Iron deficiency: Secondary | ICD-10-CM | POA: Diagnosis not present

## 2018-08-17 DIAGNOSIS — D631 Anemia in chronic kidney disease: Secondary | ICD-10-CM | POA: Diagnosis not present

## 2018-08-17 DIAGNOSIS — Z992 Dependence on renal dialysis: Secondary | ICD-10-CM | POA: Diagnosis not present

## 2018-08-18 DIAGNOSIS — L97521 Non-pressure chronic ulcer of other part of left foot limited to breakdown of skin: Secondary | ICD-10-CM | POA: Diagnosis not present

## 2018-08-18 DIAGNOSIS — I739 Peripheral vascular disease, unspecified: Secondary | ICD-10-CM | POA: Diagnosis not present

## 2018-08-19 DIAGNOSIS — E611 Iron deficiency: Secondary | ICD-10-CM | POA: Diagnosis not present

## 2018-08-19 DIAGNOSIS — N186 End stage renal disease: Secondary | ICD-10-CM | POA: Diagnosis not present

## 2018-08-19 DIAGNOSIS — N2581 Secondary hyperparathyroidism of renal origin: Secondary | ICD-10-CM | POA: Diagnosis not present

## 2018-08-19 DIAGNOSIS — D631 Anemia in chronic kidney disease: Secondary | ICD-10-CM | POA: Diagnosis not present

## 2018-08-19 DIAGNOSIS — Z992 Dependence on renal dialysis: Secondary | ICD-10-CM | POA: Diagnosis not present

## 2018-08-19 DIAGNOSIS — D509 Iron deficiency anemia, unspecified: Secondary | ICD-10-CM | POA: Diagnosis not present

## 2018-08-20 ENCOUNTER — Other Ambulatory Visit: Payer: Self-pay

## 2018-08-20 ENCOUNTER — Ambulatory Visit (INDEPENDENT_AMBULATORY_CARE_PROVIDER_SITE_OTHER): Payer: Medicare Other | Admitting: Physician Assistant

## 2018-08-20 ENCOUNTER — Encounter (INDEPENDENT_AMBULATORY_CARE_PROVIDER_SITE_OTHER): Payer: Self-pay | Admitting: Orthopedic Surgery

## 2018-08-20 VITALS — Ht 65.0 in | Wt 170.0 lb

## 2018-08-20 DIAGNOSIS — I739 Peripheral vascular disease, unspecified: Secondary | ICD-10-CM

## 2018-08-20 DIAGNOSIS — L97411 Non-pressure chronic ulcer of right heel and midfoot limited to breakdown of skin: Secondary | ICD-10-CM | POA: Diagnosis not present

## 2018-08-20 DIAGNOSIS — Z992 Dependence on renal dialysis: Secondary | ICD-10-CM

## 2018-08-20 DIAGNOSIS — N186 End stage renal disease: Secondary | ICD-10-CM

## 2018-08-20 DIAGNOSIS — Z89431 Acquired absence of right foot: Secondary | ICD-10-CM

## 2018-08-20 MED ORDER — SILVER SULFADIAZINE 1 % EX CREA: 1.0000 "application " | TOPICAL_CREAM | Freq: Every day | CUTANEOUS | 0 refills | Status: DC

## 2018-08-20 NOTE — Progress Notes (Signed)
Office Visit Note   Patient: Joseph HOBBINS Sr.           Date of Birth: 1941/01/29           MRN: [id number] Visit Date: 08/20/2018              Requested by: [name], MD [address] [place], [place] [address] PCP: [name], MD  Chief Complaint  Patient presents with  . Right Foot - Follow-up    Hx transmet amp. Ulcer   . Right Hand - Follow-up    S/p right long finger amp 06/13/18      HPI: The patient is a 78 year old gentleman who presents with ulceration of his right transmetatarsal amputation.  This is been ongoing for several months.  The areas are callused over again and he noticed some very rare drainage.  He has not been applying anything to the residual ulcer.  He ambulates with a cane in a regular shoe.  Assessment & Plan: Visit Diagnoses:  1. Midfoot skin ulcer, right, limited to breakdown of skin ([place])   2. S/P transmetatarsal amputation of foot, right ([place])   3. ESRD on dialysis ([place])   4. PVD (peripheral vascular disease) ([place])     Plan: After informed consent the callus and ulcer over the right transmetatarsal amputation area was debrided with a #10 blade knife and the patient tolerated this well.  Hemostasis was achieved with silver nitrate.  Iodosorb was applied to the area today.  He is going to use some Silvadene to the area daily after cleaning with soap and water.  He will follow-up in 2 months or sooner should he have difficulties in the interim.  Follow-Up Instructions: Return in about 2 months (around 10/20/2018).   Ortho Exam  Patient is alert, oriented, no adenopathy, well-dressed, normal affect, normal respiratory effort. Examination of the right transtibial amputation shows callus and ulcer over the plantar surface.  The callus was debrided and the residual ulcer is approximately 1 cm in diameter and approximately 1 to 2 mm in depth.  There is no odor scant serous appearing drainage. He has good palpable pedal pulses and there  are no signs of cellulitis of the foot. Imaging: No results found.   Labs: Lab Results  Component Value Date   REPTSTATUS 06/17/2018 FINAL 06/12/2018   GRAMSTAIN  02/18/2017    RARE WBC PRESENT, PREDOMINANTLY PMN FEW GRAM POSITIVE COCCI    CULT  06/12/2018    NO GROWTH 5 DAYS Performed at [facility], [address] [place], [place] [address]      Lab Results  Component Value Date   ALBUMIN 1.9 (L) 06/16/2018   ALBUMIN 2.0 (L) 06/15/2018   ALBUMIN 1.9 (L) 06/14/2018    Body mass index is 28.29 kg/m.  Orders:  No orders of the defined types were placed in this encounter.  Meds ordered this encounter  Medications  . silver sulfADIAZINE (SILVADENE) 1 % cream    Sig: Apply 1 application topically daily.    Dispense:  50 g    Refill:  0    Apply to right foot daily after washing     Procedures: No procedures performed  Clinical Data: No additional findings.  ROS:  All other systems negative, except as noted in the HPI. Review of Systems  Objective: Vital Signs: Ht 5\' 5"  (1.651 m)   Wt 170 lb (77.1 kg)   BMI 28.29 kg/m   Specialty Comments:  No specialty comments  available.  PMFS History: Patient Active Problem List   Diagnosis Date Noted  . Malnutrition of moderate degree 06/18/2018  . Severe sepsis ([place]) 06/12/2018  . Leukocytosis 06/12/2018  . MRSA (methicillin resistant staph aureus) culture positive 06/12/2018  . Essential hypertension 06/12/2018  . ESRD (end stage renal disease) ([place]) 06/09/2018  . Gangrene of finger ([place]) 06/04/2018  . S/P transmetatarsal amputation of foot, right ([place]) 05/09/2017  . PVD (peripheral vascular disease) ([place]) 04/15/2017  . Nonischemic cardiomyopathy ([place]) 08/15/2015  . Aortic stenosis 08/15/2015  . Moderate aortic stenosis 07/12/2015  . 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 CAD- 40-50% LAD at cath 07/11/15 07/11/2015  . Aspirin intolerance 07/11/2015  . Abnormal stress test   . ESRD on  dialysis ([place]) 05/30/2015  . CTS (carpal tunnel syndrome) 02/28/2015  . PVD of LE - Dr Oneida Alar follows 08/20/2012  . Midfoot skin ulcer, right, limited to breakdown of skin ([place]) 01/16/2012  . Encounter for screening colonoscopy 10/08/2011  . Melena 10/08/2011  . Other complications due to renal dialysis device, implant, and graft 09/24/2011   Past Medical History:  Diagnosis Date  . Anxiety   . Aortic stenosis   . Arthritis   . CVA (cerebral infarction)   . ESRD (end stage renal disease) on dialysis [facility])    M/W/F at [facility] in [place]  . Essential hypertension    resolved with HD  . Gangrene of right foot ([place])   . Gastric ulcer 2004  . History of cardiomyopathy    LVEF normal as of February 2017  . History of stroke    Left side weakness  . Iron deficiency anemia     Family History  Problem Relation Age of Onset  . Hypertension Mother   . Colon cancer Neg Hx   . Liver disease Neg Hx     Past Surgical History:  Procedure Laterality Date  . ABDOMINAL AORTAGRAM N/A 01/24/2012   Procedure: ABDOMINAL Maxcine Ham;  Surgeon: Elam Dutch, MD;  Location: [facility] CATH LAB;  Service: Cardiovascular;  Laterality: N/A;  . ABDOMINAL AORTOGRAM W/LOWER EXTREMITY N/A 04/15/2017   Procedure: ABDOMINAL AORTOGRAM W/LOWER EXTREMITY;  Surgeon: Angelia Mould, MD;  Location: [place] CV LAB;  Service: Cardiovascular;  Laterality: N/A;  . AMPUTATION Right 05/09/2017   Procedure: RIGHT TRANSMETATARSAL AMPUTATION;  Surgeon: [name], MD;  Location: [place];  Service: Orthopedics;  Laterality: Right;  . AMPUTATION Right 06/13/2018   Procedure: AMPUTATION RIGHT LONG FINGER;  Surgeon: Dayna Barker, MD;  Location: [place];  Service: Plastics;  Laterality: Right;  . ARTERIOVENOUS GRAFT PLACEMENT Right right arm  . AV FISTULA PLACEMENT Left 08/31/2015   Procedure: ARTERIOVENOUS (AV) FISTULA CREATION- LEFT RADIOCEPHALIC;  Surgeon: Mal Misty, MD;  Location: [place];  Service: Vascular;   Laterality: Left;  . AV FISTULA PLACEMENT Right 02/18/2017   Procedure: INSERTION OF ARTERIOVENOUS (AV) GORE-TEX GRAFT  RIGHT UPPER ARM;  Surgeon: Conrad Brimfield, MD;  Location: [place];  Service: Vascular;  Laterality: Right;  . BASCILIC VEIN TRANSPOSITION Left 12/19/2015   Procedure: FIRST STAGE BRACHIAL VEIN TRANSPOSITION;  Surgeon: Conrad Coahoma, MD;  Location: [place];  Service: Vascular;  Laterality: Left;  . BASCILIC VEIN TRANSPOSITION Left 02/08/2016   Procedure: SECOND STAGE BRACHIAL VEIN TRANSPOSITION;  Surgeon: Conrad , MD;  Location: [place];  Service: Vascular;  Laterality: Left;  . CARDIAC CATHETERIZATION N/A 07/11/2015   Procedure: Left Heart Cath and Coronary Angiography;  Surgeon: Troy Sine, MD;  Location: [place] CV LAB;  Service: Cardiovascular;  Laterality: N/A;  . Carpel Tunnel Left Dec. 22, 2016  . CHOLECYSTECTOMY    . COLONOSCOPY  2004   Dr. Irving Shows, left sided diverticula and cecal polyp, path unknown  . COLONOSCOPY  10/29/2011   Procedure: COLONOSCOPY;  Surgeon: Daneil Dolin, MD;  [facility];  Service: Endoscopy;  Laterality: N/A;  10:15  . ESOPHAGOGASTRODUODENOSCOPY  11/2002   Dr. Gala Romney, erosive reflux esophagitis, multiple gastric ulcer and antral/bulbar erosions. Serologies positive for H.Pylori and was treated  . ESOPHAGOGASTRODUODENOSCOPY  11/20014   Dr. Gala Romney, small hh only, ulcers healed  . ESOPHAGOGASTRODUODENOSCOPY  09/21/2011   Dr Trevor Iha HH, antral erosions, ?early GAVE  . FISTULOGRAM Left 12/10/2016   Procedure: THROMBECTOMY OF LEFT ARM ARTERIOVENOUS FISTULA;  Surgeon: Waynetta Sandy, MD;  Location: [place];  Service: Vascular;  Laterality: Left;  . INSERTION OF DIALYSIS CATHETER Left 12/10/2016   Procedure: INSERTION OF TUNNELED DIALYSIS CATHETER;  Surgeon: Waynetta Sandy, MD;  Location: [place];  Service: Vascular;  Laterality: Left;  . INSERTION OF DIALYSIS CATHETER Right 06/09/2018   Procedure: INSERTION OF  DIALYSIS CATHETER Right subclavian;  Surgeon: Angelia Mould, MD;  Location: [place];  Service: Vascular;  Laterality: Right;  . IR DIALY SHUNT INTRO [place] W/IMG RIGHT Right 01/01/2018  . IR GENERIC HISTORICAL  07/16/2016   IR REMOVAL TUN CV CATH W/O FL 07/16/2016 Saverio Danker, PA-C [facility]-INTERV RAD  . IR PTA ADDL CENTRAL DIALYSIS SEG THRU DIALY CIRCUIT RIGHT Right 10/21/2017  . IR REMOVAL TUN CV CATH W/O FL  05/12/2017  . IR THROMBECTOMY AV FISTULA W/THROMBOLYSIS/PTA INC/SHUNT/IMG RIGHT Right 10/21/2017  . IR THROMBECTOMY AV FISTULA W/THROMBOLYSIS/PTA INC/SHUNT/IMG RIGHT Right 11/27/2017  . IR US GUIDE VASC ACCESS RIGHT  10/21/2017  . IR US GUIDE VASC ACCESS RIGHT  11/27/2017  . IR US GUIDE VASC ACCESS RIGHT  01/01/2018  . LIGATION ARTERIOVENOUS GORTEX GRAFT Right 06/09/2018  . LIGATION ARTERIOVENOUS GORTEX GRAFT Right 06/09/2018   Procedure: LIGATION ARTERIOVENOUS GORTEX GRAFT RIGHT ARM;  Surgeon: Angelia Mould, MD;  Location: [place];  Service: Vascular;  Laterality: Right;  . LIGATION OF ARTERIOVENOUS  FISTULA Left 12/19/2015   Procedure: LIGATION OF RADIOCEPHALIC ARTERIOVENOUS  FISTULA;  Surgeon: Conrad Hollyvilla, MD;  Location: [place];  Service: Vascular;  Laterality: Left;  Marland Kitchen MASS EXCISION Right 02/18/2017   Procedure: EXCISION OF RIGHT AXILLARY EPIDERMAL INCLUSION CYST;  Surgeon: Conrad Hills, MD;  Location: [place];  Service: Vascular;  Laterality: Right;  . PERIPHERAL VASCULAR CATHETERIZATION N/A 12/14/2015   Procedure: Fistulagram;  Surgeon: Conrad Tornado, MD;  Location: [place] CV LAB;  Service: Cardiovascular;  Laterality: N/A;  . SHOULDER SURGERY    . ULTRASOUND GUIDANCE FOR VASCULAR ACCESS  04/15/2017   Procedure: Ultrasound Guidance For Vascular Access;  Surgeon: Angelia Mould, MD;  Location: [place] CV LAB;  Service: Cardiovascular;;  . UPPER EXTREMITY VENOGRAPHY Bilateral 12/17/2016   Procedure: Bilateral Upper Extremity Venography;  Surgeon: Serafina Mitchell, MD;  Location: [place] CV LAB;  Service: Cardiovascular;  Laterality: Bilateral;   Social History   Occupational History  . Occupation: retired, Licensed conveyancer    Employer: RETIRED  Tobacco Use  . Smoking status: Former Smoker    Last attempt to quit: 04/29/2004    Years since quitting: 14.3  . Smokeless tobacco: Former Systems developer    Types: Chew    Quit date: 01/16/1987  . Tobacco  comment: quit 2006  Substance and Sexual Activity  . Alcohol use: No  . Drug use: No  . Sexual activity: Not on file

## 2018-08-21 DIAGNOSIS — Z992 Dependence on renal dialysis: Secondary | ICD-10-CM | POA: Diagnosis not present

## 2018-08-21 DIAGNOSIS — N186 End stage renal disease: Secondary | ICD-10-CM | POA: Diagnosis not present

## 2018-08-21 DIAGNOSIS — D509 Iron deficiency anemia, unspecified: Secondary | ICD-10-CM | POA: Diagnosis not present

## 2018-08-21 DIAGNOSIS — N2581 Secondary hyperparathyroidism of renal origin: Secondary | ICD-10-CM | POA: Diagnosis not present

## 2018-08-21 DIAGNOSIS — D631 Anemia in chronic kidney disease: Secondary | ICD-10-CM | POA: Diagnosis not present

## 2018-08-21 DIAGNOSIS — E611 Iron deficiency: Secondary | ICD-10-CM | POA: Diagnosis not present

## 2018-08-24 DIAGNOSIS — N2581 Secondary hyperparathyroidism of renal origin: Secondary | ICD-10-CM | POA: Diagnosis not present

## 2018-08-24 DIAGNOSIS — Z992 Dependence on renal dialysis: Secondary | ICD-10-CM | POA: Diagnosis not present

## 2018-08-24 DIAGNOSIS — D509 Iron deficiency anemia, unspecified: Secondary | ICD-10-CM | POA: Diagnosis not present

## 2018-08-24 DIAGNOSIS — N186 End stage renal disease: Secondary | ICD-10-CM | POA: Diagnosis not present

## 2018-08-24 DIAGNOSIS — E611 Iron deficiency: Secondary | ICD-10-CM | POA: Diagnosis not present

## 2018-08-24 DIAGNOSIS — D631 Anemia in chronic kidney disease: Secondary | ICD-10-CM | POA: Diagnosis not present

## 2018-08-26 DIAGNOSIS — D509 Iron deficiency anemia, unspecified: Secondary | ICD-10-CM | POA: Diagnosis not present

## 2018-08-26 DIAGNOSIS — N186 End stage renal disease: Secondary | ICD-10-CM | POA: Diagnosis not present

## 2018-08-26 DIAGNOSIS — Z992 Dependence on renal dialysis: Secondary | ICD-10-CM | POA: Diagnosis not present

## 2018-08-26 DIAGNOSIS — D631 Anemia in chronic kidney disease: Secondary | ICD-10-CM | POA: Diagnosis not present

## 2018-08-26 DIAGNOSIS — N2581 Secondary hyperparathyroidism of renal origin: Secondary | ICD-10-CM | POA: Diagnosis not present

## 2018-08-26 DIAGNOSIS — E611 Iron deficiency: Secondary | ICD-10-CM | POA: Diagnosis not present

## 2018-08-27 ENCOUNTER — Encounter (HOSPITAL_COMMUNITY): Payer: Self-pay | Admitting: General Surgery

## 2018-08-27 DIAGNOSIS — N186 End stage renal disease: Secondary | ICD-10-CM | POA: Diagnosis not present

## 2018-08-27 DIAGNOSIS — Z992 Dependence on renal dialysis: Secondary | ICD-10-CM | POA: Diagnosis not present

## 2018-08-28 DIAGNOSIS — N186 End stage renal disease: Secondary | ICD-10-CM | POA: Diagnosis not present

## 2018-08-28 DIAGNOSIS — D509 Iron deficiency anemia, unspecified: Secondary | ICD-10-CM | POA: Diagnosis not present

## 2018-08-28 DIAGNOSIS — Z992 Dependence on renal dialysis: Secondary | ICD-10-CM | POA: Diagnosis not present

## 2018-08-28 DIAGNOSIS — N2581 Secondary hyperparathyroidism of renal origin: Secondary | ICD-10-CM | POA: Diagnosis not present

## 2018-08-31 DIAGNOSIS — N186 End stage renal disease: Secondary | ICD-10-CM | POA: Diagnosis not present

## 2018-08-31 DIAGNOSIS — N2581 Secondary hyperparathyroidism of renal origin: Secondary | ICD-10-CM | POA: Diagnosis not present

## 2018-08-31 DIAGNOSIS — D509 Iron deficiency anemia, unspecified: Secondary | ICD-10-CM | POA: Diagnosis not present

## 2018-08-31 DIAGNOSIS — Z992 Dependence on renal dialysis: Secondary | ICD-10-CM | POA: Diagnosis not present

## 2018-09-01 DIAGNOSIS — N186 End stage renal disease: Secondary | ICD-10-CM | POA: Diagnosis not present

## 2018-09-01 DIAGNOSIS — Z992 Dependence on renal dialysis: Secondary | ICD-10-CM | POA: Diagnosis not present

## 2018-09-01 DIAGNOSIS — T82898A Other specified complication of vascular prosthetic devices, implants and grafts, initial encounter: Secondary | ICD-10-CM | POA: Diagnosis not present

## 2018-09-02 ENCOUNTER — Emergency Department (HOSPITAL_COMMUNITY)
Admission: EM | Admit: 2018-09-02 | Discharge: 2018-09-02 | Disposition: A | Discharge status: Home / Self Care | Payer: Medicare Other | Attending: Emergency Medicine | Admitting: Emergency Medicine

## 2018-09-02 ENCOUNTER — Encounter (HOSPITAL_COMMUNITY): Payer: Self-pay

## 2018-09-02 ENCOUNTER — Other Ambulatory Visit: Payer: Self-pay

## 2018-09-02 DIAGNOSIS — L089 Local infection of the skin and subcutaneous tissue, unspecified: Secondary | ICD-10-CM | POA: Insufficient documentation

## 2018-09-02 DIAGNOSIS — L0201 Cutaneous abscess of face: Secondary | ICD-10-CM

## 2018-09-02 DIAGNOSIS — Z992 Dependence on renal dialysis: Secondary | ICD-10-CM | POA: Diagnosis not present

## 2018-09-02 DIAGNOSIS — I2510 Atherosclerotic heart disease of native coronary artery without angina pectoris: Secondary | ICD-10-CM | POA: Insufficient documentation

## 2018-09-02 DIAGNOSIS — N186 End stage renal disease: Secondary | ICD-10-CM | POA: Diagnosis not present

## 2018-09-02 DIAGNOSIS — Z87891 Personal history of nicotine dependence: Secondary | ICD-10-CM | POA: Diagnosis not present

## 2018-09-02 DIAGNOSIS — L723 Sebaceous cyst: Secondary | ICD-10-CM | POA: Diagnosis not present

## 2018-09-02 DIAGNOSIS — Z79899 Other long term (current) drug therapy: Secondary | ICD-10-CM | POA: Insufficient documentation

## 2018-09-02 DIAGNOSIS — D509 Iron deficiency anemia, unspecified: Secondary | ICD-10-CM | POA: Diagnosis not present

## 2018-09-02 DIAGNOSIS — N2581 Secondary hyperparathyroidism of renal origin: Secondary | ICD-10-CM | POA: Diagnosis not present

## 2018-09-02 DIAGNOSIS — I120 Hypertensive chronic kidney disease with stage 5 chronic kidney disease or end stage renal disease: Secondary | ICD-10-CM | POA: Insufficient documentation

## 2018-09-02 MED ORDER — LIDOCAINE HCL (PF) 1 % IJ SOLN
INTRAMUSCULAR | Status: AC
  Administered 2018-09-02: 2 mL
  Filled 2018-09-02: qty 8

## 2018-09-02 MED ORDER — SULFAMETHOXAZOLE-TRIMETHOPRIM 800-160 MG PO TABS: 1.0000 | ORAL_TABLET | Freq: Two times a day (BID) | ORAL | 0 refills | Status: AC

## 2018-09-02 MED ORDER — POVIDONE-IODINE 10 % EX SOLN
CUTANEOUS | Status: AC
  Administered 2018-09-02: 11:00:00
  Filled 2018-09-02: qty 30

## 2018-09-02 NOTE — ED Triage Notes (Signed)
Pt has abscess to left side of face that appeared a few months ago. In the last week, abscess has gotten progressively bigger. No fever or chills.

## 2018-09-02 NOTE — ED Notes (Signed)
Pt has dialysis fistula to right and left arms. Right arm is not able to be used anymore. Left arm can be used for dialysis. No sticks to left arm

## 2018-09-02 NOTE — ED Provider Notes (Signed)
[facility] EMERGENCY DEPARTMENT Provider Note   CSN: [id number] Arrival date & time: 09/02/18  0957    History   Chief Complaint Chief Complaint  Patient presents with  . Abscess    HPI Joseph KLONOWSKI Sr. is a 78 y.o. male.     The history is provided by the patient. No language interpreter was used.  Abscess  Location:  Face Facial abscess location:  Face Size:  3 Abscess quality: draining, painful and redness   Red streaking: no   Progression:  Worsening Pain details:    Severity:  Moderate   Timing:  Constant Chronicity:  New Relieved by:  Nothing Worsened by:  Nothing Ineffective treatments:  None tried Associated symptoms: no fever   Pt reports he has a large abscess on the left side of his face  Past Medical History:  Diagnosis Date  . Anxiety   . Aortic stenosis   . Arthritis   . CVA (cerebral infarction)   . ESRD (end stage renal disease) on dialysis [facility])    M/W/F at [facility] in [place]  . Essential hypertension    resolved with HD  . Gangrene of right foot ([place])   . Gastric ulcer 2004  . History of cardiomyopathy    LVEF normal as of February 2017  . History of stroke    Left side weakness  . Iron deficiency anemia     Patient Active Problem List   Diagnosis Date Noted  . Malnutrition of moderate degree 06/18/2018  . Severe sepsis ([place]) 06/12/2018  . Leukocytosis 06/12/2018  . MRSA (methicillin resistant staph aureus) culture positive 06/12/2018  . Essential hypertension 06/12/2018  . ESRD (end stage renal disease) ([place]) 06/09/2018  . Gangrene of finger ([place]) 06/04/2018  . S/P transmetatarsal amputation of foot, right ([place]) 05/09/2017  . PVD (peripheral vascular disease) ([place]) 04/15/2017  . Nonischemic cardiomyopathy ([place]) 08/15/2015  . Aortic stenosis 08/15/2015  . Moderate aortic stenosis 07/12/2015  . 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 CAD- 40-50% LAD at cath 07/11/15 07/11/2015  . Aspirin intolerance 07/11/2015  .  Abnormal stress test   . ESRD on dialysis ([place]) 05/30/2015  . CTS (carpal tunnel syndrome) 02/28/2015  . PVD of LE - Dr Oneida Alar follows 08/20/2012  . Midfoot skin ulcer, right, limited to breakdown of skin ([place]) 01/16/2012  . Encounter for screening colonoscopy 10/08/2011  . Melena 10/08/2011  . Other complications due to renal dialysis device, implant, and graft 09/24/2011    Past Surgical History:  Procedure Laterality Date  . ABDOMINAL AORTAGRAM N/A 01/24/2012   Procedure: ABDOMINAL Maxcine Ham;  Surgeon: Elam Dutch, MD;  Location: [facility] CATH LAB;  Service: Cardiovascular;  Laterality: N/A;  . ABDOMINAL AORTOGRAM W/LOWER EXTREMITY N/A 04/15/2017   Procedure: ABDOMINAL AORTOGRAM W/LOWER EXTREMITY;  Surgeon: Angelia Mould, MD;  Location: [place] CV LAB;  Service: Cardiovascular;  Laterality: N/A;  . AMPUTATION Right 05/09/2017   Procedure: RIGHT TRANSMETATARSAL AMPUTATION;  Surgeon: [name], MD;  Location: [place];  Service: Orthopedics;  Laterality: Right;  . AMPUTATION Right 06/13/2018   Procedure: AMPUTATION RIGHT LONG FINGER;  Surgeon: Dayna Barker, MD;  Location: [place];  Service: Plastics;  Laterality: Right;  . ARTERIOVENOUS GRAFT PLACEMENT Right right arm  . AV FISTULA PLACEMENT Left 08/31/2015   Procedure: ARTERIOVENOUS (AV) FISTULA CREATION- LEFT RADIOCEPHALIC;  Surgeon: Mal Misty, MD;  Location: [place];  Service: Vascular;  Laterality: Left;  . AV FISTULA PLACEMENT Right 02/18/2017   Procedure:  INSERTION OF ARTERIOVENOUS (AV) GORE-TEX GRAFT  RIGHT UPPER ARM;  Surgeon: Conrad Chubbuck, MD;  Location: [place];  Service: Vascular;  Laterality: Right;  . BASCILIC VEIN TRANSPOSITION Left 12/19/2015   Procedure: FIRST STAGE BRACHIAL VEIN TRANSPOSITION;  Surgeon: Conrad [place], MD;  Location: [place];  Service: Vascular;  Laterality: Left;  . BASCILIC VEIN TRANSPOSITION Left 02/08/2016   Procedure: SECOND STAGE BRACHIAL VEIN TRANSPOSITION;  Surgeon: Conrad [place], MD;   Location: [place];  Service: Vascular;  Laterality: Left;  . CARDIAC CATHETERIZATION N/A 07/11/2015   Procedure: Left Heart Cath and Coronary Angiography;  Surgeon: Troy Sine, MD;  Location: [place] CV LAB;  Service: Cardiovascular;  Laterality: N/A;  . Carpel Tunnel Left Dec. 22, 2016  . CHOLECYSTECTOMY    . COLONOSCOPY  2004   Dr. Irving Shows, left sided diverticula and cecal polyp, path unknown  . COLONOSCOPY  10/29/2011   Procedure: COLONOSCOPY;  Surgeon: Daneil Dolin, MD;  [facility];  Service: Endoscopy;  Laterality: N/A;  10:15  . ESOPHAGOGASTRODUODENOSCOPY  11/2002   Dr. Gala Romney, erosive reflux esophagitis, multiple gastric ulcer and antral/bulbar erosions. Serologies positive for H.Pylori and was treated  . ESOPHAGOGASTRODUODENOSCOPY  11/20014   Dr. Gala Romney, small hh only, ulcers healed  . ESOPHAGOGASTRODUODENOSCOPY  09/21/2011   Dr Trevor Iha HH, antral erosions, ?early GAVE  . FISTULOGRAM Left 12/10/2016   Procedure: THROMBECTOMY OF LEFT ARM ARTERIOVENOUS FISTULA;  Surgeon: Waynetta Sandy, MD;  Location: [place];  Service: Vascular;  Laterality: Left;  . INSERTION OF DIALYSIS CATHETER Left 12/10/2016   Procedure: INSERTION OF TUNNELED DIALYSIS CATHETER;  Surgeon: Waynetta Sandy, MD;  Location: [place];  Service: Vascular;  Laterality: Left;  . INSERTION OF DIALYSIS CATHETER Right 06/09/2018   Procedure: INSERTION OF DIALYSIS CATHETER Right subclavian;  Surgeon: Angelia Mould, MD;  Location: [place];  Service: Vascular;  Laterality: Right;  . IR DIALY SHUNT INTRO [place] W/IMG RIGHT Right 01/01/2018  . IR GENERIC HISTORICAL  07/16/2016   IR REMOVAL TUN CV CATH W/O FL 07/16/2016 Saverio Danker, PA-C [facility]INTER[facility]  . IR PTA ADDL CENTRAL DIALYSIS SEG THRU DIALY CIRCUIT RIGHT Right 10/21/2017  . IR REMOVAL TUN CV CATH W/O FL  05/12/2017  . IR THROMBECTOMY AV FISTULA W/THROMBOLYSIS/PTA INC/SHUNT/IMG RIGHT Right 10/21/2017  . IR THROMBECTOMY  AV FISTULA W/THROMBOLYSIS/PTA INC/SHUNT/IMG RIGHT Right 11/27/2017  . IR US GUIDE VASC ACCESS RIGHT  10/21/2017  . IR US GUIDE VASC ACCESS RIGHT  11/27/2017  . IR US GUIDE VASC ACCESS RIGHT  01/01/2018  . LIGATION ARTERIOVENOUS GORTEX GRAFT Right 06/09/2018  . LIGATION ARTERIOVENOUS GORTEX GRAFT Right 06/09/2018   Procedure: LIGATION ARTERIOVENOUS GORTEX GRAFT RIGHT ARM;  Surgeon: Angelia Mould, MD;  Location: [place];  Service: Vascular;  Laterality: Right;  . LIGATION OF ARTERIOVENOUS  FISTULA Left 12/19/2015   Procedure: LIGATION OF RADIOCEPHALIC ARTERIOVENOUS  FISTULA;  Surgeon: Conrad [place]akes, MD;  Location: [place];  Service: Vascular;  Laterality: Left;  Marland Kitchen MASS EXCISION Right 02/18/2017   Procedure: EXCISION OF RIGHT AXILLARY EPIDERMAL INCLUSION CYST;  Surgeon: Conrad Centre, MD;  Location: [place];  Service: Vascular;  Laterality: Right;  . PERIPHERAL VASCULAR CATHETERIZATION N/A 12/14/2015   Procedure: Fistulagram;  Surgeon: Conrad Blessing, MD;  Location: [place] CV LAB;  Service: Cardiovascular;  Laterality: N/A;  . SHOULDER SURGERY    . ULTRASOUND GUIDANCE FOR VASCULAR ACCESS  04/15/2017   Procedure: Ultrasound Guidance For Vascular Access;  Surgeon: Angelia Mould, MD;  Location: [place] CV LAB;  Service: Cardiovascular;;  . UPPER EXTREMITY VENOGRAPHY Bilateral 12/17/2016   Procedure: Bilateral Upper Extremity Venography;  Surgeon: Serafina Mitchell, MD;  Location: [place] CV LAB;  Service: Cardiovascular;  Laterality: Bilateral;        Home Medications    Prior to Admission medications   Medication Sig Start Date End Date Taking? Authorizing Provider  atorvastatin (LIPITOR) 40 MG tablet Take 40 mg by mouth daily. 11/03/15   [provider]  b complex-vitamin c-folic acid (NEPHRO-VITE) 0.8 MG TABS Take 0.8 mg by mouth See admin instructions. Takes on Tuesdays, Thursdays, Saturdays, and Sundays. Does not take on Mondays, Wednesdays, and Fridays due to Dialysis  treatments.    [provider]  cinacalcet (SENSIPAR) 60 MG tablet Take 60 mg by mouth every evening. With evening meal    [provider]  HYDROcodone-acetaminophen (NORCO/VICODIN) 5-325 MG tablet Take 1 tablet by mouth every 6 (six) hours as needed for moderate pain. 06/18/18   Ghimire, Henreitta Leber, MD  nitroGLYCERIN (NITRODUR - DOSED IN MG/24 HR) 0.2 mg/hr patch Place 1 patch (0.2 mg total) onto the skin daily. Apply 1 patch to dorsum of foot daily. 05/05/18   Rayburn, Neta Mends, PA-C  Nutritional Supplements (FEEDING SUPPLEMENT, NEPRO CARB STEADY,) LIQD Take 237 mLs by mouth 2 (two) times daily between meals. 06/18/18   Ghimire, Henreitta Leber, MD  sevelamer (RENVELA) 800 MG tablet Take 1,600-3,200 mg by mouth See admin instructions. Take 3200  mg by mouth 3 times daily with full meals and take 1600 mg by mouth with snacks.    [provider]  silver sulfADIAZINE (SILVADENE) 1 % cream Apply 1 application topically daily. 08/20/18   Rayburn, Neta Mends, PA-C  sulfamethoxazole-trimethoprim (BACTRIM DS) 800-160 MG tablet Take 1 tablet by mouth 2 (two) times daily for 7 days. 09/02/18 09/09/18  Fransico Meadow, PA-C    Family History Family History  Problem Relation Age of Onset  . Hypertension Mother   . Colon cancer Neg Hx   . Liver disease Neg Hx     Social History Social History   Tobacco Use  . Smoking status: Former Smoker    Last attempt to quit: 04/29/2004    Years since quitting: 14.3  . Smokeless tobacco: Former Systems developer    Types: Chew    Quit date: 01/16/1987  . Tobacco comment: quit 2006  Substance Use Topics  . Alcohol use: No  . Drug use: No     Allergies   Aspirin   Review of Systems Review of Systems  Constitutional: Negative for fever.  Skin: Positive for color change and wound.  All other systems reviewed and are negative.    Physical Exam Updated Vital Signs BP 116/64 (BP Location: Right Arm)   Pulse 64   Temp 97.6 F (36.4 C)  (Oral)   Resp 12   SpO2 100%   Physical Exam Vitals signs and nursing note reviewed.  Constitutional:      Appearance: He is well-developed.  HENT:     Head: Normocephalic and atraumatic.     Comments: Large swollen area left face   Eyes:     Conjunctiva/sclera: Conjunctivae normal.  Neck:     Musculoskeletal: Neck supple.  Cardiovascular:     Rate and Rhythm: Normal rate and regular rhythm.     Heart sounds: No murmur.  Pulmonary:     Effort: Pulmonary effort is normal. No respiratory distress.  Breath sounds: Normal breath sounds.  Abdominal:     Palpations: Abdomen is soft.     Tenderness: There is no abdominal tenderness.  Skin:    General: Skin is warm and dry.  Neurological:     Mental Status: He is alert.  Psychiatric:        Mood and Affect: Mood normal.      ED Treatments / Results  Labs (all labs ordered are listed, but only abnormal results are displayed) Labs Reviewed - No data to display  EKG None  Radiology No results found.  Procedures .Marland KitchenIncision and Drainage Date/Time: 09/02/2018 2:34 PM Performed by: Fransico Meadow, PA-C Authorized by: Fransico Meadow, PA-C   Consent:    Consent obtained:  Verbal   Consent given by:  Patient   Alternatives discussed:  No treatment Location:    Type:  Abscess   Size:  4 Pre-procedure details:    Skin preparation:  Betadine Procedure type:    Complexity:  Simple Procedure details:    Needle aspiration: no     Incision types:  Single straight   Incision depth:  Dermal   Scalpel blade:  11   Wound management:  Probed and deloculated   Drainage amount:  Moderate   Wound treatment:  Wound left open   Packing materials:  None Post-procedure details:    Patient tolerance of procedure:  Tolerated well, no immediate complications Comments:     Purulent and necrotic cystic drainage    (including critical care time)  Medications Ordered in ED Medications  lidocaine (PF) (XYLOCAINE) 1 % injection  (2 mLs  Given 09/02/18 1035)  povidone-iodine (BETADINE) 10 % external solution (  Given 09/02/18 1035)     Initial Impression / Assessment and Plan / ED Course  I have reviewed the triage vital signs and the nursing notes.  Pertinent labs & imaging results that were available during my care of the patient were reviewed by me and considered in my medical decision making (see chart for details).        MDM  Pt counseled on cyst.   Pt advised of need for wound care.   Final Clinical Impressions(s) / ED Diagnoses   Final diagnoses:  Infected sebaceous cyst  Abscess of face    ED Discharge Orders         Ordered    sulfamethoxazole-trimethoprim (BACTRIM DS) 800-160 MG tablet  2 times daily     09/02/18 1054        An After Visit Summary was printed and given to the patient.    Fransico Meadow, PA-C 09/02/18 [place], [place], DO 09/05/18 1910

## 2018-09-02 NOTE — Discharge Instructions (Signed)
Return if any problem.  Recheck if any problems.

## 2018-09-04 DIAGNOSIS — D509 Iron deficiency anemia, unspecified: Secondary | ICD-10-CM | POA: Diagnosis not present

## 2018-09-04 DIAGNOSIS — N2581 Secondary hyperparathyroidism of renal origin: Secondary | ICD-10-CM | POA: Diagnosis not present

## 2018-09-04 DIAGNOSIS — Z992 Dependence on renal dialysis: Secondary | ICD-10-CM | POA: Diagnosis not present

## 2018-09-04 DIAGNOSIS — N186 End stage renal disease: Secondary | ICD-10-CM | POA: Diagnosis not present

## 2018-09-06 DIAGNOSIS — N2581 Secondary hyperparathyroidism of renal origin: Secondary | ICD-10-CM | POA: Diagnosis not present

## 2018-09-06 DIAGNOSIS — N186 End stage renal disease: Secondary | ICD-10-CM | POA: Diagnosis not present

## 2018-09-06 DIAGNOSIS — D509 Iron deficiency anemia, unspecified: Secondary | ICD-10-CM | POA: Diagnosis not present

## 2018-09-06 DIAGNOSIS — Z992 Dependence on renal dialysis: Secondary | ICD-10-CM | POA: Diagnosis not present

## 2018-09-07 DIAGNOSIS — D509 Iron deficiency anemia, unspecified: Secondary | ICD-10-CM | POA: Diagnosis not present

## 2018-09-07 DIAGNOSIS — Z992 Dependence on renal dialysis: Secondary | ICD-10-CM | POA: Diagnosis not present

## 2018-09-07 DIAGNOSIS — N2581 Secondary hyperparathyroidism of renal origin: Secondary | ICD-10-CM | POA: Diagnosis not present

## 2018-09-07 DIAGNOSIS — N186 End stage renal disease: Secondary | ICD-10-CM | POA: Diagnosis not present

## 2018-09-09 DIAGNOSIS — Z992 Dependence on renal dialysis: Secondary | ICD-10-CM | POA: Diagnosis not present

## 2018-09-09 DIAGNOSIS — N2581 Secondary hyperparathyroidism of renal origin: Secondary | ICD-10-CM | POA: Diagnosis not present

## 2018-09-09 DIAGNOSIS — D509 Iron deficiency anemia, unspecified: Secondary | ICD-10-CM | POA: Diagnosis not present

## 2018-09-09 DIAGNOSIS — N186 End stage renal disease: Secondary | ICD-10-CM | POA: Diagnosis not present

## 2018-09-11 DIAGNOSIS — N2581 Secondary hyperparathyroidism of renal origin: Secondary | ICD-10-CM | POA: Diagnosis not present

## 2018-09-11 DIAGNOSIS — Z992 Dependence on renal dialysis: Secondary | ICD-10-CM | POA: Diagnosis not present

## 2018-09-11 DIAGNOSIS — D509 Iron deficiency anemia, unspecified: Secondary | ICD-10-CM | POA: Diagnosis not present

## 2018-09-11 DIAGNOSIS — N186 End stage renal disease: Secondary | ICD-10-CM | POA: Diagnosis not present

## 2018-09-14 DIAGNOSIS — D509 Iron deficiency anemia, unspecified: Secondary | ICD-10-CM | POA: Diagnosis not present

## 2018-09-14 DIAGNOSIS — Z992 Dependence on renal dialysis: Secondary | ICD-10-CM | POA: Diagnosis not present

## 2018-09-14 DIAGNOSIS — N2581 Secondary hyperparathyroidism of renal origin: Secondary | ICD-10-CM | POA: Diagnosis not present

## 2018-09-14 DIAGNOSIS — N186 End stage renal disease: Secondary | ICD-10-CM | POA: Diagnosis not present

## 2018-09-16 DIAGNOSIS — N2581 Secondary hyperparathyroidism of renal origin: Secondary | ICD-10-CM | POA: Diagnosis not present

## 2018-09-16 DIAGNOSIS — N186 End stage renal disease: Secondary | ICD-10-CM | POA: Diagnosis not present

## 2018-09-16 DIAGNOSIS — D509 Iron deficiency anemia, unspecified: Secondary | ICD-10-CM | POA: Diagnosis not present

## 2018-09-16 DIAGNOSIS — Z992 Dependence on renal dialysis: Secondary | ICD-10-CM | POA: Diagnosis not present

## 2018-09-18 DIAGNOSIS — D509 Iron deficiency anemia, unspecified: Secondary | ICD-10-CM | POA: Diagnosis not present

## 2018-09-18 DIAGNOSIS — N186 End stage renal disease: Secondary | ICD-10-CM | POA: Diagnosis not present

## 2018-09-18 DIAGNOSIS — Z992 Dependence on renal dialysis: Secondary | ICD-10-CM | POA: Diagnosis not present

## 2018-09-18 DIAGNOSIS — N2581 Secondary hyperparathyroidism of renal origin: Secondary | ICD-10-CM | POA: Diagnosis not present

## 2018-09-21 DIAGNOSIS — D509 Iron deficiency anemia, unspecified: Secondary | ICD-10-CM | POA: Diagnosis not present

## 2018-09-21 DIAGNOSIS — N2581 Secondary hyperparathyroidism of renal origin: Secondary | ICD-10-CM | POA: Diagnosis not present

## 2018-09-21 DIAGNOSIS — Z992 Dependence on renal dialysis: Secondary | ICD-10-CM | POA: Diagnosis not present

## 2018-09-21 DIAGNOSIS — N186 End stage renal disease: Secondary | ICD-10-CM | POA: Diagnosis not present

## 2018-09-23 DIAGNOSIS — N2581 Secondary hyperparathyroidism of renal origin: Secondary | ICD-10-CM | POA: Diagnosis not present

## 2018-09-23 DIAGNOSIS — N186 End stage renal disease: Secondary | ICD-10-CM | POA: Diagnosis not present

## 2018-09-23 DIAGNOSIS — D509 Iron deficiency anemia, unspecified: Secondary | ICD-10-CM | POA: Diagnosis not present

## 2018-09-23 DIAGNOSIS — Z992 Dependence on renal dialysis: Secondary | ICD-10-CM | POA: Diagnosis not present

## 2018-09-25 DIAGNOSIS — N2581 Secondary hyperparathyroidism of renal origin: Secondary | ICD-10-CM | POA: Diagnosis not present

## 2018-09-25 DIAGNOSIS — D509 Iron deficiency anemia, unspecified: Secondary | ICD-10-CM | POA: Diagnosis not present

## 2018-09-25 DIAGNOSIS — N186 End stage renal disease: Secondary | ICD-10-CM | POA: Diagnosis not present

## 2018-09-25 DIAGNOSIS — Z992 Dependence on renal dialysis: Secondary | ICD-10-CM | POA: Diagnosis not present

## 2018-09-27 DIAGNOSIS — N186 End stage renal disease: Secondary | ICD-10-CM | POA: Diagnosis not present

## 2018-09-27 DIAGNOSIS — Z992 Dependence on renal dialysis: Secondary | ICD-10-CM | POA: Diagnosis not present

## 2018-09-28 DIAGNOSIS — Z992 Dependence on renal dialysis: Secondary | ICD-10-CM | POA: Diagnosis not present

## 2018-09-28 DIAGNOSIS — N2581 Secondary hyperparathyroidism of renal origin: Secondary | ICD-10-CM | POA: Diagnosis not present

## 2018-09-28 DIAGNOSIS — D509 Iron deficiency anemia, unspecified: Secondary | ICD-10-CM | POA: Diagnosis not present

## 2018-09-28 DIAGNOSIS — N186 End stage renal disease: Secondary | ICD-10-CM | POA: Diagnosis not present

## 2018-09-28 DIAGNOSIS — T82898A Other specified complication of vascular prosthetic devices, implants and grafts, initial encounter: Secondary | ICD-10-CM | POA: Diagnosis not present

## 2018-09-30 DIAGNOSIS — N186 End stage renal disease: Secondary | ICD-10-CM | POA: Diagnosis not present

## 2018-09-30 DIAGNOSIS — D509 Iron deficiency anemia, unspecified: Secondary | ICD-10-CM | POA: Diagnosis not present

## 2018-09-30 DIAGNOSIS — T82898A Other specified complication of vascular prosthetic devices, implants and grafts, initial encounter: Secondary | ICD-10-CM | POA: Diagnosis not present

## 2018-09-30 DIAGNOSIS — N2581 Secondary hyperparathyroidism of renal origin: Secondary | ICD-10-CM | POA: Diagnosis not present

## 2018-09-30 DIAGNOSIS — Z992 Dependence on renal dialysis: Secondary | ICD-10-CM | POA: Diagnosis not present

## 2018-10-02 DIAGNOSIS — T82898A Other specified complication of vascular prosthetic devices, implants and grafts, initial encounter: Secondary | ICD-10-CM | POA: Diagnosis not present

## 2018-10-02 DIAGNOSIS — N2581 Secondary hyperparathyroidism of renal origin: Secondary | ICD-10-CM | POA: Diagnosis not present

## 2018-10-02 DIAGNOSIS — N186 End stage renal disease: Secondary | ICD-10-CM | POA: Diagnosis not present

## 2018-10-02 DIAGNOSIS — Z992 Dependence on renal dialysis: Secondary | ICD-10-CM | POA: Diagnosis not present

## 2018-10-02 DIAGNOSIS — I871 Compression of vein: Secondary | ICD-10-CM | POA: Diagnosis not present

## 2018-10-02 DIAGNOSIS — D509 Iron deficiency anemia, unspecified: Secondary | ICD-10-CM | POA: Diagnosis not present

## 2018-10-03 DIAGNOSIS — Z992 Dependence on renal dialysis: Secondary | ICD-10-CM | POA: Diagnosis not present

## 2018-10-03 DIAGNOSIS — D509 Iron deficiency anemia, unspecified: Secondary | ICD-10-CM | POA: Diagnosis not present

## 2018-10-03 DIAGNOSIS — T82898A Other specified complication of vascular prosthetic devices, implants and grafts, initial encounter: Secondary | ICD-10-CM | POA: Diagnosis not present

## 2018-10-03 DIAGNOSIS — N2581 Secondary hyperparathyroidism of renal origin: Secondary | ICD-10-CM | POA: Diagnosis not present

## 2018-10-03 DIAGNOSIS — N186 End stage renal disease: Secondary | ICD-10-CM | POA: Diagnosis not present

## 2018-10-05 DIAGNOSIS — T82898A Other specified complication of vascular prosthetic devices, implants and grafts, initial encounter: Secondary | ICD-10-CM | POA: Diagnosis not present

## 2018-10-05 DIAGNOSIS — N186 End stage renal disease: Secondary | ICD-10-CM | POA: Diagnosis not present

## 2018-10-05 DIAGNOSIS — Z992 Dependence on renal dialysis: Secondary | ICD-10-CM | POA: Diagnosis not present

## 2018-10-05 DIAGNOSIS — D509 Iron deficiency anemia, unspecified: Secondary | ICD-10-CM | POA: Diagnosis not present

## 2018-10-05 DIAGNOSIS — N2581 Secondary hyperparathyroidism of renal origin: Secondary | ICD-10-CM | POA: Diagnosis not present

## 2018-10-06 DIAGNOSIS — D509 Iron deficiency anemia, unspecified: Secondary | ICD-10-CM | POA: Diagnosis not present

## 2018-10-06 DIAGNOSIS — Z89431 Acquired absence of right foot: Secondary | ICD-10-CM | POA: Diagnosis not present

## 2018-10-06 DIAGNOSIS — N2581 Secondary hyperparathyroidism of renal origin: Secondary | ICD-10-CM | POA: Diagnosis not present

## 2018-10-06 DIAGNOSIS — I739 Peripheral vascular disease, unspecified: Secondary | ICD-10-CM | POA: Diagnosis not present

## 2018-10-06 DIAGNOSIS — L97521 Non-pressure chronic ulcer of other part of left foot limited to breakdown of skin: Secondary | ICD-10-CM | POA: Diagnosis not present

## 2018-10-06 DIAGNOSIS — Z992 Dependence on renal dialysis: Secondary | ICD-10-CM | POA: Diagnosis not present

## 2018-10-06 DIAGNOSIS — T82898A Other specified complication of vascular prosthetic devices, implants and grafts, initial encounter: Secondary | ICD-10-CM | POA: Diagnosis not present

## 2018-10-06 DIAGNOSIS — N186 End stage renal disease: Secondary | ICD-10-CM | POA: Diagnosis not present

## 2018-10-07 DIAGNOSIS — N186 End stage renal disease: Secondary | ICD-10-CM | POA: Diagnosis not present

## 2018-10-07 DIAGNOSIS — D509 Iron deficiency anemia, unspecified: Secondary | ICD-10-CM | POA: Diagnosis not present

## 2018-10-07 DIAGNOSIS — Z992 Dependence on renal dialysis: Secondary | ICD-10-CM | POA: Diagnosis not present

## 2018-10-07 DIAGNOSIS — T82898A Other specified complication of vascular prosthetic devices, implants and grafts, initial encounter: Secondary | ICD-10-CM | POA: Diagnosis not present

## 2018-10-07 DIAGNOSIS — N2581 Secondary hyperparathyroidism of renal origin: Secondary | ICD-10-CM | POA: Diagnosis not present

## 2018-10-08 ENCOUNTER — Encounter: Payer: Self-pay | Admitting: Orthopedic Surgery

## 2018-10-08 ENCOUNTER — Ambulatory Visit (INDEPENDENT_AMBULATORY_CARE_PROVIDER_SITE_OTHER): Payer: Medicare Other | Admitting: Physician Assistant

## 2018-10-08 ENCOUNTER — Other Ambulatory Visit: Payer: Self-pay

## 2018-10-08 VITALS — Ht 65.0 in | Wt 170.0 lb

## 2018-10-08 DIAGNOSIS — N186 End stage renal disease: Secondary | ICD-10-CM

## 2018-10-08 DIAGNOSIS — Z89431 Acquired absence of right foot: Secondary | ICD-10-CM

## 2018-10-08 DIAGNOSIS — L97411 Non-pressure chronic ulcer of right heel and midfoot limited to breakdown of skin: Secondary | ICD-10-CM

## 2018-10-08 DIAGNOSIS — I739 Peripheral vascular disease, unspecified: Secondary | ICD-10-CM

## 2018-10-08 NOTE — Progress Notes (Signed)
Office Visit Note   Patient: Joseph SOLIVAN Sr.           Date of Birth: 30-Oct-1940           MRN: [id number] Visit Date: 10/08/2018              Requested by: [name], MD [address] [place],  [place] [address] PCP: [name], MD  Chief Complaint  Patient presents with  . Right Foot - Pain    Hx transmet amputation painful callus       HPI: The patient is a 78 yo gentleman who is seen for follow up of a ulcer over his distal residual transmetatarsal amputation of the right foot. He reports recurrent callus over the area, but denies drainage. He reports some pain over the area when ambulating.   Assessment & Plan: Visit Diagnoses:  1. Midfoot skin ulcer, right, limited to breakdown of skin ([place])   2. S/P transmetatarsal amputation of foot, right ([place])   3. ESRD on dialysis ([place])   4. PVD (peripheral vascular disease) ([place])     Plan: after informed consent, the callus/ulcer over the distal transmetatarsal amputation site to healthy bleeding tissue and silver nitrate was used for hemostasis.  He was given a prescription for [facility] to evaluate for new insert/spacer .  Follow up in 4 weeks.   Follow-Up Instructions: Return in about 4 weeks (around 11/05/2018).   Ortho Exam  Patient is alert, oriented, no adenopathy, well-dressed, normal affect, normal respiratory effort. The distal right transmetatarsal amputation site was debrided with a #10 blade knife to healthy bleeding tissue and hemostasis was achieved with silver nitrate. There are no signs of infection or cellulitis of the residual foot. Palpable pedal pulses.   Imaging: No results found. No images are attached to the encounter.  Labs: Lab Results  Component Value Date   REPTSTATUS 06/17/2018 FINAL 06/12/2018   GRAMSTAIN  02/18/2017    RARE WBC PRESENT, PREDOMINANTLY PMN FEW GRAM POSITIVE COCCI    CULT  06/12/2018    NO GROWTH 5 DAYS Performed at [facility], [address],  [place], [place] [address]      Lab Results  Component Value Date   ALBUMIN 1.9 (L) 06/16/2018   ALBUMIN 2.0 (L) 06/15/2018   ALBUMIN 1.9 (L) 06/14/2018    Body mass index is 28.29 kg/m.  Orders:  No orders of the defined types were placed in this encounter.  No orders of the defined types were placed in this encounter.    Procedures: No procedures performed  Clinical Data: No additional findings.  ROS:  All other systems negative, except as noted in the HPI. Review of Systems  Objective: Vital Signs: Ht 5\' 5"  (1.651 m)   Wt 170 lb (77.1 kg)   BMI 28.29 kg/m   Specialty Comments:  No specialty comments available.  PMFS History: Patient Active Problem List   Diagnosis Date Noted  . Malnutrition of moderate degree 06/18/2018  . Severe sepsis ([place]) 06/12/2018  . Leukocytosis 06/12/2018  . MRSA (methicillin resistant staph aureus) culture positive 06/12/2018  . Essential hypertension 06/12/2018  . ESRD (end stage renal disease) ([place]) 06/09/2018  . Gangrene of finger ([place]) 06/04/2018  . S/P transmetatarsal amputation of foot, right ([place]) 05/09/2017  . PVD (peripheral vascular disease) ([place]) 04/15/2017  . Nonischemic cardiomyopathy ([place]) 08/15/2015  . Aortic stenosis 08/15/2015  . Moderate aortic stenosis 07/12/2015  . 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 CAD-  40-50% LAD at cath 07/11/15 07/11/2015  . Aspirin intolerance 07/11/2015  . Abnormal stress test   . ESRD on dialysis ([place]) 05/30/2015  . CTS (carpal tunnel syndrome) 02/28/2015  . PVD of LE - Dr Oneida Alar follows 08/20/2012  . Midfoot skin ulcer, right, limited to breakdown of skin ([place]) 01/16/2012  . Encounter for screening colonoscopy 10/08/2011  . Melena 10/08/2011  . Other complications due to renal dialysis device, implant, and graft 09/24/2011   Past Medical History:  Diagnosis Date  . Anxiety   . Aortic stenosis   . Arthritis   . CVA (cerebral infarction)   . ESRD (end stage  renal disease) on dialysis [facility])    M/W/F at [facility] in [place]  . Essential hypertension    resolved with HD  . Gangrene of right foot ([place])   . Gastric ulcer 2004  . History of cardiomyopathy    LVEF normal as of February 2017  . History of stroke    Left side weakness  . Iron deficiency anemia     Family History  Problem Relation Age of Onset  . Hypertension Mother   . Colon cancer Neg Hx   . Liver disease Neg Hx     Past Surgical History:  Procedure Laterality Date  . ABDOMINAL AORTAGRAM N/A 01/24/2012   Procedure: ABDOMINAL Maxcine Ham;  Surgeon: Elam Dutch, MD;  Location: [facility] CATH LAB;  Service: Cardiovascular;  Laterality: N/A;  . ABDOMINAL AORTOGRAM W/LOWER EXTREMITY N/A 04/15/2017   Procedure: ABDOMINAL AORTOGRAM W/LOWER EXTREMITY;  Surgeon: Angelia Mould, MD;  Location: [place] CV LAB;  Service: Cardiovascular;  Laterality: N/A;  . AMPUTATION Right 05/09/2017   Procedure: RIGHT TRANSMETATARSAL AMPUTATION;  Surgeon: [name], MD;  Location: [place];  Service: Orthopedics;  Laterality: Right;  . AMPUTATION Right 06/13/2018   Procedure: AMPUTATION RIGHT LONG FINGER;  Surgeon: Dayna Barker, MD;  Location: [place];  Service: Plastics;  Laterality: Right;  . ARTERIOVENOUS GRAFT PLACEMENT Right right arm  . AV FISTULA PLACEMENT Left 08/31/2015   Procedure: ARTERIOVENOUS (AV) FISTULA CREATION- LEFT RADIOCEPHALIC;  Surgeon: Mal Misty, MD;  Location: [place];  Service: Vascular;  Laterality: Left;  . AV FISTULA PLACEMENT Right 02/18/2017   Procedure: INSERTION OF ARTERIOVENOUS (AV) GORE-TEX GRAFT  RIGHT UPPER ARM;  Surgeon: Conrad Dundee, MD;  Location: [place];  Service: Vascular;  Laterality: Right;  . BASCILIC VEIN TRANSPOSITION Left 12/19/2015   Procedure: FIRST STAGE BRACHIAL VEIN TRANSPOSITION;  Surgeon: Conrad [place] Sandy, MD;  Location: [place];  Service: Vascular;  Laterality: Left;  . BASCILIC VEIN TRANSPOSITION Left 02/08/2016   Procedure: SECOND STAGE BRACHIAL  VEIN TRANSPOSITION;  Surgeon: Conrad Ephraim, MD;  Location: [place];  Service: Vascular;  Laterality: Left;  . CARDIAC CATHETERIZATION N/A 07/11/2015   Procedure: Left Heart Cath and Coronary Angiography;  Surgeon: Troy Sine, MD;  Location: [place] CV LAB;  Service: Cardiovascular;  Laterality: N/A;  . Carpel Tunnel Left Dec. 22, 2016  . CHOLECYSTECTOMY    . COLONOSCOPY  2004   Dr. Irving Shows, left sided diverticula and cecal polyp, path unknown  . COLONOSCOPY  10/29/2011   Procedure: COLONOSCOPY;  Surgeon: Daneil Dolin, MD;  [facility];  Service: Endoscopy;  Laterality: N/A;  10:15  . ESOPHAGOGASTRODUODENOSCOPY  11/2002   Dr. Gala Romney, erosive reflux esophagitis, multiple gastric ulcer and antral/bulbar erosions. Serologies positive for H.Pylori and was treated  . ESOPHAGOGASTRODUODENOSCOPY  11/20014   Dr. Gala Romney, small hh  only, ulcers healed  . ESOPHAGOGASTRODUODENOSCOPY  09/21/2011   Dr Trevor Iha [facility], antral erosions, ?early GAVE  . FISTULOGRAM Left 12/10/2016   Procedure: THROMBECTOMY OF LEFT ARM ARTERIOVENOUS FISTULA;  Surgeon: Waynetta Sandy, MD;  Location: [place];  Service: Vascular;  Laterality: Left;  . INSERTION OF DIALYSIS CATHETER Left 12/10/2016   Procedure: INSERTION OF TUNNELED DIALYSIS CATHETER;  Surgeon: Waynetta Sandy, MD;  Location: [facility];  Service: Vascular;  Laterality: Left;  . INSERTION OF DIALYSIS CATHETER Right 06/09/2018   Procedure: INSERTION OF DIALYSIS CATHETER Right subclavian;  Surgeon: Angelia Mould, MD;  Location: [place];  Service: Vascular;  Laterality: Right;  . IR DIALY SHUNT INTRO [place] W/IMG RIGHT Right 01/01/2018  . IR GENERIC HISTORICAL  07/16/2016   IR REMOVAL TUN CV CATH W/O FL 07/16/2016 Saverio Danker, PA-C [facility]-INTERV RAD  . IR PTA ADDL CENTRAL DIALYSIS SEG THRU DIALY CIRCUIT RIGHT Right 10/21/2017  . IR REMOVAL TUN CV CATH W/O FL  05/12/2017  . IR THROMBECTOMY AV FISTULA W/THROMBOLYSIS/PTA  INC/SHUNT/IMG RIGHT Right 10/21/2017  . IR THROMBECTOMY AV FISTULA W/THROMBOLYSIS/PTA INC/SHUNT/IMG RIGHT Right 11/27/2017  . IR US GUIDE VASC ACCESS RIGHT  10/21/2017  . IR US GUIDE VASC ACCESS RIGHT  11/27/2017  . IR US GUIDE VASC ACCESS RIGHT  01/01/2018  . LIGATION ARTERIOVENOUS GORTEX GRAFT Right 06/09/2018  . LIGATION ARTERIOVENOUS GORTEX GRAFT Right 06/09/2018   Procedure: LIGATION ARTERIOVENOUS GORTEX GRAFT RIGHT ARM;  Surgeon: Angelia Mould, MD;  Location: [place];  Service: Vascular;  Laterality: Right;  . LIGATION OF ARTERIOVENOUS  FISTULA Left 12/19/2015   Procedure: LIGATION OF RADIOCEPHALIC ARTERIOVENOUS  FISTULA;  Surgeon: Conrad L[place], MD;  Location: [place];  Service: Vascular;  Laterality: Left;  Marland Kitchen MASS EXCISION Right 02/18/2017   Procedure: EXCISION OF RIGHT AXILLARY EPIDERMAL INCLUSION CYST;  Surgeon: Conrad Madelia, MD;  Location: [place];  Service: Vascular;  Laterality: Right;  . PERIPHERAL VASCULAR CATHETERIZATION N/A 12/14/2015   Procedure: Fistulagram;  Surgeon: Conrad Radom, MD;  Location: [place] CV LAB;  Service: Cardiovascular;  Laterality: N/A;  . SHOULDER SURGERY    . ULTRASOUND GUIDANCE FOR VASCULAR ACCESS  04/15/2017   Procedure: Ultrasound Guidance For Vascular Access;  Surgeon: Angelia Mould, MD;  Location: [place] CV LAB;  Service: Cardiovascular;;  . UPPER EXTREMITY VENOGRAPHY Bilateral 12/17/2016   Procedure: Bilateral Upper Extremity Venography;  Surgeon: Serafina Mitchell, MD;  Location: [place] CV LAB;  Service: Cardiovascular;  Laterality: Bilateral;   Social History   Occupational History  . Occupation: retired, Licensed conveyancer    Employer: RETIRED  Tobacco Use  . Smoking status: Former Smoker    Quit date: 04/29/2004    Years since quitting: 14.4  . Smokeless tobacco: Former Systems developer    Types: Chew    Quit date: 01/16/1987  . Tobacco comment: quit 2006  Substance and Sexual Activity  . Alcohol use: No  . Drug use: No  . Sexual activity:  Not on file

## 2018-10-09 DIAGNOSIS — N186 End stage renal disease: Secondary | ICD-10-CM | POA: Diagnosis not present

## 2018-10-09 DIAGNOSIS — T82898A Other specified complication of vascular prosthetic devices, implants and grafts, initial encounter: Secondary | ICD-10-CM | POA: Diagnosis not present

## 2018-10-09 DIAGNOSIS — D509 Iron deficiency anemia, unspecified: Secondary | ICD-10-CM | POA: Diagnosis not present

## 2018-10-09 DIAGNOSIS — N2581 Secondary hyperparathyroidism of renal origin: Secondary | ICD-10-CM | POA: Diagnosis not present

## 2018-10-09 DIAGNOSIS — Z992 Dependence on renal dialysis: Secondary | ICD-10-CM | POA: Diagnosis not present

## 2018-10-12 DIAGNOSIS — T82898A Other specified complication of vascular prosthetic devices, implants and grafts, initial encounter: Secondary | ICD-10-CM | POA: Diagnosis not present

## 2018-10-12 DIAGNOSIS — Z992 Dependence on renal dialysis: Secondary | ICD-10-CM | POA: Diagnosis not present

## 2018-10-12 DIAGNOSIS — D509 Iron deficiency anemia, unspecified: Secondary | ICD-10-CM | POA: Diagnosis not present

## 2018-10-12 DIAGNOSIS — N186 End stage renal disease: Secondary | ICD-10-CM | POA: Diagnosis not present

## 2018-10-12 DIAGNOSIS — N2581 Secondary hyperparathyroidism of renal origin: Secondary | ICD-10-CM | POA: Diagnosis not present

## 2018-10-13 DIAGNOSIS — T8249XA Other complication of vascular dialysis catheter, initial encounter: Secondary | ICD-10-CM | POA: Diagnosis not present

## 2018-10-13 DIAGNOSIS — N186 End stage renal disease: Secondary | ICD-10-CM | POA: Diagnosis not present

## 2018-10-13 DIAGNOSIS — Z992 Dependence on renal dialysis: Secondary | ICD-10-CM | POA: Diagnosis not present

## 2018-10-13 DIAGNOSIS — I871 Compression of vein: Secondary | ICD-10-CM | POA: Diagnosis not present

## 2018-10-14 DIAGNOSIS — N186 End stage renal disease: Secondary | ICD-10-CM | POA: Diagnosis not present

## 2018-10-14 DIAGNOSIS — Z992 Dependence on renal dialysis: Secondary | ICD-10-CM | POA: Diagnosis not present

## 2018-10-14 DIAGNOSIS — N2581 Secondary hyperparathyroidism of renal origin: Secondary | ICD-10-CM | POA: Diagnosis not present

## 2018-10-14 DIAGNOSIS — D509 Iron deficiency anemia, unspecified: Secondary | ICD-10-CM | POA: Diagnosis not present

## 2018-10-14 DIAGNOSIS — T82898A Other specified complication of vascular prosthetic devices, implants and grafts, initial encounter: Secondary | ICD-10-CM | POA: Diagnosis not present

## 2018-10-16 DIAGNOSIS — D509 Iron deficiency anemia, unspecified: Secondary | ICD-10-CM | POA: Diagnosis not present

## 2018-10-16 DIAGNOSIS — Z992 Dependence on renal dialysis: Secondary | ICD-10-CM | POA: Diagnosis not present

## 2018-10-16 DIAGNOSIS — N2581 Secondary hyperparathyroidism of renal origin: Secondary | ICD-10-CM | POA: Diagnosis not present

## 2018-10-16 DIAGNOSIS — N186 End stage renal disease: Secondary | ICD-10-CM | POA: Diagnosis not present

## 2018-10-16 DIAGNOSIS — T82898A Other specified complication of vascular prosthetic devices, implants and grafts, initial encounter: Secondary | ICD-10-CM | POA: Diagnosis not present

## 2018-10-19 DIAGNOSIS — N186 End stage renal disease: Secondary | ICD-10-CM | POA: Diagnosis not present

## 2018-10-19 DIAGNOSIS — Z992 Dependence on renal dialysis: Secondary | ICD-10-CM | POA: Diagnosis not present

## 2018-10-19 DIAGNOSIS — D509 Iron deficiency anemia, unspecified: Secondary | ICD-10-CM | POA: Diagnosis not present

## 2018-10-19 DIAGNOSIS — N2581 Secondary hyperparathyroidism of renal origin: Secondary | ICD-10-CM | POA: Diagnosis not present

## 2018-10-19 DIAGNOSIS — T82898A Other specified complication of vascular prosthetic devices, implants and grafts, initial encounter: Secondary | ICD-10-CM | POA: Diagnosis not present

## 2018-10-20 ENCOUNTER — Ambulatory Visit: Payer: Self-pay | Admitting: Orthopedic Surgery

## 2018-10-21 DIAGNOSIS — N186 End stage renal disease: Secondary | ICD-10-CM | POA: Diagnosis not present

## 2018-10-21 DIAGNOSIS — T82898A Other specified complication of vascular prosthetic devices, implants and grafts, initial encounter: Secondary | ICD-10-CM | POA: Diagnosis not present

## 2018-10-21 DIAGNOSIS — N2581 Secondary hyperparathyroidism of renal origin: Secondary | ICD-10-CM | POA: Diagnosis not present

## 2018-10-21 DIAGNOSIS — D509 Iron deficiency anemia, unspecified: Secondary | ICD-10-CM | POA: Diagnosis not present

## 2018-10-21 DIAGNOSIS — Z992 Dependence on renal dialysis: Secondary | ICD-10-CM | POA: Diagnosis not present

## 2018-10-22 DIAGNOSIS — I951 Orthostatic hypotension: Secondary | ICD-10-CM | POA: Diagnosis not present

## 2018-10-22 DIAGNOSIS — I2510 Atherosclerotic heart disease of native coronary artery without angina pectoris: Secondary | ICD-10-CM | POA: Diagnosis not present

## 2018-10-22 DIAGNOSIS — N186 End stage renal disease: Secondary | ICD-10-CM | POA: Diagnosis not present

## 2018-10-23 DIAGNOSIS — D509 Iron deficiency anemia, unspecified: Secondary | ICD-10-CM | POA: Diagnosis not present

## 2018-10-23 DIAGNOSIS — N186 End stage renal disease: Secondary | ICD-10-CM | POA: Diagnosis not present

## 2018-10-23 DIAGNOSIS — T82898A Other specified complication of vascular prosthetic devices, implants and grafts, initial encounter: Secondary | ICD-10-CM | POA: Diagnosis not present

## 2018-10-23 DIAGNOSIS — Z992 Dependence on renal dialysis: Secondary | ICD-10-CM | POA: Diagnosis not present

## 2018-10-23 DIAGNOSIS — N2581 Secondary hyperparathyroidism of renal origin: Secondary | ICD-10-CM | POA: Diagnosis not present

## 2018-10-26 DIAGNOSIS — N186 End stage renal disease: Secondary | ICD-10-CM | POA: Diagnosis not present

## 2018-10-26 DIAGNOSIS — D509 Iron deficiency anemia, unspecified: Secondary | ICD-10-CM | POA: Diagnosis not present

## 2018-10-26 DIAGNOSIS — Z992 Dependence on renal dialysis: Secondary | ICD-10-CM | POA: Diagnosis not present

## 2018-10-26 DIAGNOSIS — T82898A Other specified complication of vascular prosthetic devices, implants and grafts, initial encounter: Secondary | ICD-10-CM | POA: Diagnosis not present

## 2018-10-26 DIAGNOSIS — N2581 Secondary hyperparathyroidism of renal origin: Secondary | ICD-10-CM | POA: Diagnosis not present

## 2018-10-27 ENCOUNTER — Encounter: Payer: Self-pay | Admitting: Vascular Surgery

## 2018-10-27 DIAGNOSIS — Z992 Dependence on renal dialysis: Secondary | ICD-10-CM | POA: Diagnosis not present

## 2018-10-27 DIAGNOSIS — N186 End stage renal disease: Secondary | ICD-10-CM | POA: Diagnosis not present

## 2018-10-28 DIAGNOSIS — N186 End stage renal disease: Secondary | ICD-10-CM | POA: Diagnosis not present

## 2018-10-28 DIAGNOSIS — Z992 Dependence on renal dialysis: Secondary | ICD-10-CM | POA: Diagnosis not present

## 2018-10-28 DIAGNOSIS — N2581 Secondary hyperparathyroidism of renal origin: Secondary | ICD-10-CM | POA: Diagnosis not present

## 2018-10-28 DIAGNOSIS — D509 Iron deficiency anemia, unspecified: Secondary | ICD-10-CM | POA: Diagnosis not present

## 2018-10-29 ENCOUNTER — Encounter: Payer: Self-pay | Admitting: *Deleted

## 2018-10-29 ENCOUNTER — Other Ambulatory Visit: Payer: Self-pay

## 2018-10-29 ENCOUNTER — Encounter: Payer: Self-pay | Admitting: Vascular Surgery

## 2018-10-29 ENCOUNTER — Other Ambulatory Visit: Payer: Self-pay | Admitting: *Deleted

## 2018-10-29 ENCOUNTER — Ambulatory Visit (HOSPITAL_COMMUNITY)
Admission: RE | Admit: 2018-10-29 | Discharge: 2018-10-29 | Disposition: A | Discharge status: Home / Self Care | Payer: Medicare Other | Source: Ambulatory Visit | Attending: Vascular Surgery | Admitting: Vascular Surgery

## 2018-10-29 ENCOUNTER — Ambulatory Visit (INDEPENDENT_AMBULATORY_CARE_PROVIDER_SITE_OTHER)
Admission: RE | Admit: 2018-10-29 | Discharge: 2018-10-29 | Disposition: A | Discharge status: Home / Self Care | Payer: Medicare Other | Source: Ambulatory Visit | Attending: Vascular Surgery | Admitting: Vascular Surgery

## 2018-10-29 ENCOUNTER — Ambulatory Visit (INDEPENDENT_AMBULATORY_CARE_PROVIDER_SITE_OTHER): Payer: Medicare Other | Admitting: Vascular Surgery

## 2018-10-29 VITALS — BP 120/77 | HR 84 | Temp 97.6°F | Resp 20 | Ht 65.0 in | Wt 156.0 lb

## 2018-10-29 DIAGNOSIS — T82868A Thrombosis of vascular prosthetic devices, implants and grafts, initial encounter: Secondary | ICD-10-CM

## 2018-10-29 DIAGNOSIS — Z992 Dependence on renal dialysis: Secondary | ICD-10-CM | POA: Diagnosis not present

## 2018-10-29 DIAGNOSIS — N186 End stage renal disease: Secondary | ICD-10-CM

## 2018-10-29 NOTE — Progress Notes (Signed)
Patient name: Joseph HOSIER Sr. MRN: [id number] DOB: 1940/07/04 Sex: male  REASON FOR VISIT:   To evaluate for new hemodialysis access.  The consult is requested by Dr. Lowanda Foster.  HPI:   Joseph BOZMAN Sr. is a pleasant 78 y.o. 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  On 06/09/2018, he underwent placement of a right subclavian vein tunneled dialysis catheter and ligation of his right upper arm graft.  Of note this patient had had a previous central venogram which showed no central venous occlusion on the left.  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  This patient has had multiple previous access procedures.  Most recently had the right arm graft ligated for steal.  This was an upper arm graft.  On the left side he has had a brachial vein transposition which failed and also a previous left radiocephalic fistula which failed.  He previously had wounds on the right foot and ultimately required a transmetatarsal amputation.  He is undergone an arteriogram which shows diffuse calcific disease and anterior tibial runoff only with no real options for revascularization back in 2018.  Since his graft was ligated he required amputation of the involved finger on the right hand and this is healing.  He denies any recent uremic symptoms.  The patient dialyzes on Monday Wednesdays and Fridays.  Past Medical History:  Diagnosis Date  . Anxiety   . Aortic stenosis   . Arthritis   . CVA (cerebral infarction)   . ESRD (end stage renal disease) on dialysis [facility])    M/W/F at [facility] in [place]  . Essential hypertension    resolved with HD  . Gangrene of right foot ([place])   . Gastric ulcer 2004  . History of cardiomyopathy    LVEF normal as of February 2017  . History of stroke    Left side weakness  . Iron deficiency anemia     Family History  Problem Relation Age of Onset  . Hypertension  Mother   . Colon cancer Neg Hx   . Liver disease Neg Hx     SOCIAL HISTORY: Social History   Tobacco Use  . Smoking status: Former Smoker    Quit date: 04/29/2004    Years since quitting: 14.5  . Smokeless tobacco: Former Systems developer    Types: Chew    Quit date: 01/16/1987  . Tobacco comment: quit 2006  Substance Use Topics  . Alcohol use: No    Allergies  Allergen Reactions  . Aspirin Other (See Comments)    INTERNAL BLEEDING Causes internal bleeding  Other Reaction: history of ulcers    Current Outpatient Medications  Medication Sig Dispense Refill  . atorvastatin (LIPITOR) 40 MG tablet Take 40 mg by mouth daily.  3  . b complex-vitamin c-folic acid (NEPHRO-VITE) 0.8 MG TABS Take 0.8 mg by mouth See admin instructions. Takes on Tuesdays, Thursdays, Saturdays, and Sundays. Does not take on Mondays, Wednesdays, and Fridays due to Dialysis treatments.    . cinacalcet (SENSIPAR) 60 MG tablet Take 60 mg by mouth every evening. With evening meal    . HYDROcodone-acetaminophen (NORCO/VICODIN) 5-325 MG tablet Take 1 tablet by mouth every 6 (six) hours as needed for moderate pain. 20 tablet 0  . meclizine (ANTIVERT) 25 MG tablet TK 1 T PO BID PRN    . midodrine (PROAMATINE) 10 MG tablet TK 1  T PO BEFORE DIALYSIS    . nitroGLYCERIN (NITRODUR - DOSED IN MG/24 HR) 0.2 mg/hr patch Place 1 patch (0.2 mg total) onto the skin daily. Apply 1 patch to dorsum of foot daily. 30 patch 11  . Nutritional Supplements (FEEDING SUPPLEMENT, NEPRO CARB STEADY,) LIQD Take 237 mLs by mouth 2 (two) times daily between meals.  0  . sevelamer (RENVELA) 800 MG tablet Take 1,600-3,200 mg by mouth See admin instructions. Take 3200  mg by mouth 3 times daily with full meals and take 1600 mg by mouth with snacks.    . silver sulfADIAZINE (SILVADENE) 1 % cream Apply 1 application topically daily. 50 g 0   No current facility-administered medications for this visit.     REVIEW OF SYSTEMS:  [X]  denotes positive finding,  [ ]  denotes negative finding Cardiac  Comments:  Chest pain or chest pressure:    Shortness of breath upon exertion:    Short of breath when lying flat:    Irregular heart rhythm:        Vascular    Pain in calf, thigh, or hip brought on by ambulation:    Pain in feet at night that wakes you up from your sleep:     Blood clot in your veins:    Leg swelling:         Pulmonary    Oxygen at home:    Productive cough:     Wheezing:         Neurologic    Sudden weakness in arms or legs:     Sudden numbness in arms or legs:     Sudden onset of difficulty speaking or slurred speech:    Temporary loss of vision in one eye:     Problems with dizziness:         Gastrointestinal    Blood in stool:     Vomited blood:         Genitourinary    Burning when urinating:     Blood in urine:        Psychiatric    Major depression:         Hematologic    Bleeding problems:    Problems with blood clotting too easily:        Skin    Rashes or ulcers:        Constitutional    Fever or chills:     PHYSICAL EXAM:   Vitals:   10/29/18 0901  BP: 120/77  Pulse: 84  Resp: 20  Temp: 97.6 F (36.4 C)  SpO2: 98%  Weight: 156 lb (70.8 kg)  Height: 5\' 5"  (1.651 m)    GENERAL: The patient is a well-nourished male, in no acute distress. The vital signs are documented above. CARDIAC: There is a regular rate and rhythm.  VASCULAR: I do not detect carotid bruits. On my exam, I cannot palpate a radial or ulnar pulse on the left.  On my exam he has a monophasic radial and ulnar signal with the Doppler. I cannot palpate femoral pulses.  He appears to have bulky plaques in both common femoral arteries. He has a fairly brisk anterior tibial signal bilaterally.  This is his only runoff bilaterally. PULMONARY: There is good air exchange bilaterally without wheezing or rales. ABDOMEN: Soft and non-tender with normal pitched bowel sounds.  MUSCULOSKELETAL: He has a healed amputation of the right  middle finger. NEUROLOGIC: No focal weakness or paresthesias are detected. SKIN: There are no ulcers  or rashes noted. PSYCHIATRIC: The patient has a normal affect.  DATA:    ARTERIOGRAM: I reviewed his arteriogram from 2018.  He had diffuse multilevel disease with runoff via the anterior tibial artery only bilaterally.  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  ARTERIAL DUPLEX: I have independently interpreted his arterial duplex scan today.  He was noted to have biphasic signals in the radial ulnar position bilaterally.  VEIN MAP: I have independently interpreted his vein map.  I do not see any adequate veins for use as a fistula.  MEDICAL ISSUES:   END-STAGE RENAL DISEASE: This patient has a very complicated access issue.  Given that he required ligation of his right upper arm graft for steal that ultimately resulted in amputation of the right middle finger I do not think he is a candidate for access in the right arm.  He has had a failed brachial vein transposition and radiocephalic fistula on the left and clearly has no further options for a fistula.  His only option for a graft would be an upper arm graft on the left.  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  For this reason before considering an upper arm graft on the left I have recommended a central venogram.  The only alternative would be a thigh graft which she is very much opposed to.  If ultimately he requires a thigh graft I would recommend a CT angiogram of the abdomen and pelvis with runoff given that I cannot palpate femoral pulses and he has large bulky plaques in both common femoral arteries.  He may not be a candidate for a thigh graft.  Regardless we will start with a central venogram and determine if he is a candidate for a left upper arm graft.  He dialyzes on  Monday Wednesdays and Fridays so we will arrange this on a Tuesday or Thursday.  We will make further recommendations pending these results.  A total of 40 minutes was spent on this visit. 20 minutes was face to face time. More than 50% of the time was spent on counseling and coordinating with the patient.    Deitra Mayo [facility] [phone]

## 2018-10-29 NOTE — H&P (View-Only) (Signed)
Patient name: Joseph FEDEWA Sr. MRN: [id number] DOB: 1941/03/13 Sex: male  REASON FOR VISIT:   To evaluate for new hemodialysis access.  The consult is requested by Dr. Lowanda Foster.  HPI:   Joseph AXTMAN Sr. is a pleasant 78 y.o. 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  On 06/09/2018, he underwent placement of a right subclavian vein tunneled dialysis catheter and ligation of his right upper arm graft.  Of note this patient had had a previous central venogram which showed no central venous occlusion on the left.  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  This patient has had multiple previous access procedures.  Most recently had the right arm graft ligated for steal.  This was an upper arm graft.  On the left side he has had a brachial vein transposition which failed and also a previous left radiocephalic fistula which failed.  He previously had wounds on the right foot and ultimately required a transmetatarsal amputation.  He is undergone an arteriogram which shows diffuse calcific disease and anterior tibial runoff only with no real options for revascularization back in 2018.  Since his graft was ligated he required amputation of the involved finger on the right hand and this is healing.  He denies any recent uremic symptoms.  The patient dialyzes on Monday Wednesdays and Fridays.  Past Medical History:  Diagnosis Date  . Anxiety   . Aortic stenosis   . Arthritis   . CVA (cerebral infarction)   . ESRD (end stage renal disease) on dialysis [facility])    M/W/F at [facility] in [place]  . Essential hypertension    resolved with HD  . Gangrene of right foot ([place])   . Gastric ulcer 2004  . History of cardiomyopathy    LVEF normal as of February 2017  . History of stroke    Left side weakness  . Iron deficiency anemia     Family History  Problem Relation Age of Onset  . Hypertension  Mother   . Colon cancer Neg Hx   . Liver disease Neg Hx     SOCIAL HISTORY: Social History   Tobacco Use  . Smoking status: Former Smoker    Quit date: 04/29/2004    Years since quitting: 14.5  . Smokeless tobacco: Former Systems developer    Types: Chew    Quit date: 01/16/1987  . Tobacco comment: quit 2006  Substance Use Topics  . Alcohol use: No    Allergies  Allergen Reactions  . Aspirin Other (See Comments)    INTERNAL BLEEDING Causes internal bleeding  Other Reaction: history of ulcers    Current Outpatient Medications  Medication Sig Dispense Refill  . atorvastatin (LIPITOR) 40 MG tablet Take 40 mg by mouth daily.  3  . b complex-vitamin c-folic acid (NEPHRO-VITE) 0.8 MG TABS Take 0.8 mg by mouth See admin instructions. Takes on Tuesdays, Thursdays, Saturdays, and Sundays. Does not take on Mondays, Wednesdays, and Fridays due to Dialysis treatments.    . cinacalcet (SENSIPAR) 60 MG tablet Take 60 mg by mouth every evening. With evening meal    . HYDROcodone-acetaminophen (NORCO/VICODIN) 5-325 MG tablet Take 1 tablet by mouth every 6 (six) hours as needed for moderate pain. 20 tablet 0  . meclizine (ANTIVERT) 25 MG tablet TK 1 T PO BID PRN    . midodrine (PROAMATINE) 10 MG tablet TK 1  T PO BEFORE DIALYSIS    . nitroGLYCERIN (NITRODUR - DOSED IN MG/24 HR) 0.2 mg/hr patch Place 1 patch (0.2 mg total) onto the skin daily. Apply 1 patch to dorsum of foot daily. 30 patch 11  . Nutritional Supplements (FEEDING SUPPLEMENT, NEPRO CARB STEADY,) LIQD Take 237 mLs by mouth 2 (two) times daily between meals.  0  . sevelamer (RENVELA) 800 MG tablet Take 1,600-3,200 mg by mouth See admin instructions. Take 3200  mg by mouth 3 times daily with full meals and take 1600 mg by mouth with snacks.    . silver sulfADIAZINE (SILVADENE) 1 % cream Apply 1 application topically daily. 50 g 0   No current facility-administered medications for this visit.     REVIEW OF SYSTEMS:  [X]  denotes positive finding,  [ ]  denotes negative finding Cardiac  Comments:  Chest pain or chest pressure:    Shortness of breath upon exertion:    Short of breath when lying flat:    Irregular heart rhythm:        Vascular    Pain in calf, thigh, or hip brought on by ambulation:    Pain in feet at night that wakes you up from your sleep:     Blood clot in your veins:    Leg swelling:         Pulmonary    Oxygen at home:    Productive cough:     Wheezing:         Neurologic    Sudden weakness in arms or legs:     Sudden numbness in arms or legs:     Sudden onset of difficulty speaking or slurred speech:    Temporary loss of vision in one eye:     Problems with dizziness:         Gastrointestinal    Blood in stool:     Vomited blood:         Genitourinary    Burning when urinating:     Blood in urine:        Psychiatric    Major depression:         Hematologic    Bleeding problems:    Problems with blood clotting too easily:        Skin    Rashes or ulcers:        Constitutional    Fever or chills:     PHYSICAL EXAM:   Vitals:   10/29/18 0901  BP: 120/77  Pulse: 84  Resp: 20  Temp: 97.6 F (36.4 C)  SpO2: 98%  Weight: 156 lb (70.8 kg)  Height: 5\' 5"  (1.651 m)    GENERAL: The patient is a well-nourished male, in no acute distress. The vital signs are documented above. CARDIAC: There is a regular rate and rhythm.  VASCULAR: I do not detect carotid bruits. On my exam, I cannot palpate a radial or ulnar pulse on the left.  On my exam he has a monophasic radial and ulnar signal with the Doppler. I cannot palpate femoral pulses.  He appears to have bulky plaques in both common femoral arteries. He has a fairly brisk anterior tibial signal bilaterally.  This is his only runoff bilaterally. PULMONARY: There is good air exchange bilaterally without wheezing or rales. ABDOMEN: Soft and non-tender with normal pitched bowel sounds.  MUSCULOSKELETAL: He has a healed amputation of the right  middle finger. NEUROLOGIC: No focal weakness or paresthesias are detected. SKIN: There are no ulcers  or rashes noted. PSYCHIATRIC: The patient has a normal affect.  DATA:    ARTERIOGRAM: I reviewed his arteriogram from 2018.  He had diffuse multilevel disease with runoff via the anterior tibial artery only bilaterally.  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  ARTERIAL DUPLEX: I have independently interpreted his arterial duplex scan today.  He was noted to have biphasic signals in the radial ulnar position bilaterally.  VEIN MAP: I have independently interpreted his vein map.  I do not see any adequate veins for use as a fistula.  MEDICAL ISSUES:   END-STAGE RENAL DISEASE: This patient has a very complicated access issue.  Given that he required ligation of his right upper arm graft for steal that ultimately resulted in amputation of the right middle finger I do not think he is a candidate for access in the right arm.  He has had a failed brachial vein transposition and radiocephalic fistula on the left and clearly has no further options for a fistula.  His only option for a graft would be an upper arm graft on the left.  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  For this reason before considering an upper arm graft on the left I have recommended a central venogram.  The only alternative would be a thigh graft which she is very much opposed to.  If ultimately he requires a thigh graft I would recommend a CT angiogram of the abdomen and pelvis with runoff given that I cannot palpate femoral pulses and he has large bulky plaques in both common femoral arteries.  He may not be a candidate for a thigh graft.  Regardless we will start with a central venogram and determine if he is a candidate for a left upper arm graft.  He dialyzes on  Monday Wednesdays and Fridays so we will arrange this on a Tuesday or Thursday.  We will make further recommendations pending these results.  A total of 40 minutes was spent on this visit. 20 minutes was face to face time. More than 50% of the time was spent on counseling and coordinating with the patient.    Deitra Mayo [facility] [phone]

## 2018-10-30 DIAGNOSIS — Z992 Dependence on renal dialysis: Secondary | ICD-10-CM | POA: Diagnosis not present

## 2018-10-30 DIAGNOSIS — N2581 Secondary hyperparathyroidism of renal origin: Secondary | ICD-10-CM | POA: Diagnosis not present

## 2018-10-30 DIAGNOSIS — D509 Iron deficiency anemia, unspecified: Secondary | ICD-10-CM | POA: Diagnosis not present

## 2018-10-30 DIAGNOSIS — N186 End stage renal disease: Secondary | ICD-10-CM | POA: Diagnosis not present

## 2018-11-02 ENCOUNTER — Other Ambulatory Visit (HOSPITAL_COMMUNITY)
Admission: RE | Admit: 2018-11-02 | Discharge: 2018-11-02 | Disposition: A | Discharge status: Home / Self Care | Payer: Medicare Other | Source: Ambulatory Visit | Attending: Surgery | Admitting: Surgery

## 2018-11-02 ENCOUNTER — Other Ambulatory Visit: Payer: Self-pay

## 2018-11-02 DIAGNOSIS — D509 Iron deficiency anemia, unspecified: Secondary | ICD-10-CM | POA: Diagnosis not present

## 2018-11-02 DIAGNOSIS — Z01812 Encounter for preprocedural laboratory examination: Secondary | ICD-10-CM | POA: Insufficient documentation

## 2018-11-02 DIAGNOSIS — N2581 Secondary hyperparathyroidism of renal origin: Secondary | ICD-10-CM | POA: Diagnosis not present

## 2018-11-02 DIAGNOSIS — Z1159 Encounter for screening for other viral diseases: Secondary | ICD-10-CM | POA: Insufficient documentation

## 2018-11-02 DIAGNOSIS — Z992 Dependence on renal dialysis: Secondary | ICD-10-CM | POA: Diagnosis not present

## 2018-11-02 DIAGNOSIS — N186 End stage renal disease: Secondary | ICD-10-CM | POA: Diagnosis not present

## 2018-11-03 ENCOUNTER — Ambulatory Visit (HOSPITAL_COMMUNITY)
Admission: RE | Admit: 2018-11-03 | Discharge: 2018-11-03 | Disposition: A | Discharge status: Home / Self Care | Payer: Medicare Other | Attending: Surgery | Admitting: Surgery

## 2018-11-03 ENCOUNTER — Other Ambulatory Visit: Payer: Self-pay

## 2018-11-03 ENCOUNTER — Encounter (HOSPITAL_COMMUNITY)
Admission: RE | Disposition: A | Discharge status: Home / Self Care | Payer: Self-pay | Source: Home / Self Care | Attending: Surgery

## 2018-11-03 ENCOUNTER — Encounter (HOSPITAL_COMMUNITY): Payer: Self-pay | Admitting: Surgery

## 2018-11-03 DIAGNOSIS — I871 Compression of vein: Secondary | ICD-10-CM | POA: Diagnosis not present

## 2018-11-03 DIAGNOSIS — I428 Other cardiomyopathies: Secondary | ICD-10-CM | POA: Diagnosis not present

## 2018-11-03 DIAGNOSIS — Z992 Dependence on renal dialysis: Secondary | ICD-10-CM | POA: Insufficient documentation

## 2018-11-03 DIAGNOSIS — M1990 Unspecified osteoarthritis, unspecified site: Secondary | ICD-10-CM | POA: Diagnosis not present

## 2018-11-03 DIAGNOSIS — Z89021 Acquired absence of right finger(s): Secondary | ICD-10-CM | POA: Diagnosis not present

## 2018-11-03 DIAGNOSIS — N186 End stage renal disease: Secondary | ICD-10-CM | POA: Diagnosis not present

## 2018-11-03 DIAGNOSIS — N185 Chronic kidney disease, stage 5: Secondary | ICD-10-CM | POA: Diagnosis not present

## 2018-11-03 DIAGNOSIS — Z8673 Personal history of transient ischemic attack (TIA), and cerebral infarction without residual deficits: Secondary | ICD-10-CM | POA: Diagnosis not present

## 2018-11-03 DIAGNOSIS — Z79899 Other long term (current) drug therapy: Secondary | ICD-10-CM | POA: Diagnosis not present

## 2018-11-03 DIAGNOSIS — I350 Nonrheumatic aortic (valve) stenosis: Secondary | ICD-10-CM | POA: Diagnosis not present

## 2018-11-03 DIAGNOSIS — I120 Hypertensive chronic kidney disease with stage 5 chronic kidney disease or end stage renal disease: Secondary | ICD-10-CM | POA: Diagnosis not present

## 2018-11-03 DIAGNOSIS — Z886 Allergy status to analgesic agent status: Secondary | ICD-10-CM | POA: Diagnosis not present

## 2018-11-03 DIAGNOSIS — Z8249 Family history of ischemic heart disease and other diseases of the circulatory system: Secondary | ICD-10-CM | POA: Diagnosis not present

## 2018-11-03 DIAGNOSIS — Z87891 Personal history of nicotine dependence: Secondary | ICD-10-CM | POA: Diagnosis not present

## 2018-11-03 HISTORY — PX: UPPER EXTREMITY VENOGRAPHY: CATH118272

## 2018-11-03 LAB — POCT I-STAT, CHEM 8
BUN: 29 mg/dL — ABNORMAL HIGH (ref 8–23)
Calcium, Ion: 0.82 mmol/L — CL (ref 1.15–1.40)
Chloride: 108 mmol/L (ref 98–111)
Creatinine, Ser: 10 mg/dL — ABNORMAL HIGH (ref 0.61–1.24)
Glucose, Bld: 91 mg/dL (ref 70–99)
HCT: 54 % — ABNORMAL HIGH (ref 39.0–52.0)
Hemoglobin: 18.4 g/dL — ABNORMAL HIGH (ref 13.0–17.0)
Potassium: 4.2 mmol/L (ref 3.5–5.1)
Sodium: 133 mmol/L — ABNORMAL LOW (ref 135–145)
TCO2: 22 mmol/L (ref 22–32)

## 2018-11-03 LAB — SARS CORONAVIRUS 2 (TAT 6-24 HRS): SARS Coronavirus 2: NEGATIVE

## 2018-11-03 SURGERY — UPPER EXTREMITY VENOGRAPHY
Anesthesia: LOCAL | Laterality: Left

## 2018-11-03 MED ORDER — SODIUM CHLORIDE 0.9% FLUSH: 3.0000 mL | INTRAVENOUS | Status: DC | PRN

## 2018-11-03 MED ORDER — SODIUM CHLORIDE 0.9% FLUSH: 3.0000 mL | Freq: Two times a day (BID) | INTRAVENOUS | Status: DC

## 2018-11-03 MED ORDER — SODIUM CHLORIDE 0.9 % IV SOLN: 250.0000 mL | INTRAVENOUS | Status: DC | PRN

## 2018-11-03 MED ORDER — IODIXANOL 320 MG/ML IV SOLN
INTRAVENOUS | Status: DC | PRN
  Administered 2018-11-03: 25 mL via INTRAVENOUS

## 2018-11-03 SURGICAL SUPPLY — 2 items
STOPCOCK MORSE 400PSI 3WAY (MISCELLANEOUS) ×1 IMPLANT
TUBING CIL FLEX 10 FLL-RA (TUBING) ×1 IMPLANT

## 2018-11-03 NOTE — Interval H&P Note (Signed)
History and Physical Interval Note:  11/03/2018 8:31 AM  Joseph Maryland Sr.  has presented today for surgery, with the diagnosis of Poor flow in fistula.  The various methods of treatment have been discussed with the patient and family. After consideration of risks, benefits and other options for treatment, the patient has consented to  Procedure(s): UPPER EXTREMITY VENOGRAPHY (Left) as a surgical intervention.  The patient's history has been reviewed, patient examined, no change in status, stable for surgery.  I have reviewed the patient's chart and labs.  Questions were answered to the patient's satisfaction.     Annamarie Major

## 2018-11-03 NOTE — Discharge Instructions (Signed)
Venogram, Care After °This sheet gives you information about how to care for yourself after your procedure. Your health care provider may also give you more specific instructions. If you have problems or questions, contact your health care provider. °What can I expect after the procedure? °After the procedure, it is common to have: °· Bruising or mild discomfort in the area where the IV was inserted (insertion site). °Follow these instructions at home: °Eating and drinking ° °· Follow instructions from your health care provider about eating or drinking restrictions. °· Drink a lot of fluids for the first several days after the procedure, as directed by your health care provider. This helps to wash (flush) the contrast out of your body. Examples of healthy fluids include water or low-calorie drinks. °General instructions °· Check your IV insertion area every day for signs of infection. Check for: °? Redness, swelling, or pain. °? Fluid or blood. °? Warmth. °? Pus or a bad smell. °· Take over-the-counter and prescription medicines only as told by your health care provider. °· Rest and return to your normal activities as told by your health care provider. Ask your health care provider what activities are safe for you. °· Do not drive for 24 hours if you were given a medicine to help you relax (sedative), or until your health care provider approves. °· Keep all follow-up visits as told by your health care provider. This is important. °Contact a health care provider if: °· Your skin becomes itchy or you develop a rash or hives. °· You have a fever that does not get better with medicine. °· You feel nauseous. °· You vomit. °· You have redness, swelling, or pain around the insertion site. °· You have fluid or blood coming from the insertion site. °· Your insertion area feels warm to the touch. °· You have pus or a bad smell coming from the insertion site. °Get help right away if: °· You have difficulty breathing or  shortness of breath. °· You develop chest pain. °· You faint. °· You feel very dizzy. °These symptoms may represent a serious problem that is an emergency. Do not wait to see if the symptoms will go away. Get medical help right away. Call your local emergency services (911 in the [place] Do not drive yourself to the hospital. °Summary °· After your procedure, it is common to have bruising or mild discomfort in the area where the IV was inserted. °· You should check your IV insertion area every day for signs of infection. °· Take over-the-counter and prescription medicines only as told by your health care provider. °· You should drink a lot of fluids for the first several days after the procedure to help flush the contrast from your body. °This information is not intended to replace advice given to you by your health care provider. Make sure you discuss any questions you have with your health care provider. °Document Released: 02/03/2013 Document Revised: 03/28/2017 Document Reviewed: 03/09/2016 °Elsevier Patient Education © 2020 [employer] ° °

## 2018-11-03 NOTE — Op Note (Signed)
    Patient name: Joseph MARQUARD Sr. MRN: [id number] DOB: [birth year] Sex: male  11/03/2018 Pre-operative Diagnosis: ESRD Post-operative diagnosis:  Same Surgeon:  Annamarie Major Procedure Performed:  1.  Left-sided venogram    Indications: The patient is in need of new access.  He has an occluded right sided central venous system.  He is refusing a leg graft.  He is here for evaluation of the left side.  Procedure:  The patient was identified in the holding area and taken to room 8.  The patient was then placed supine on the table and prepped and draped in the usual sterile fashion.  A time out was called.  Contrast injections were performed through the IV that was placed in the holding area.  This was a 25-gauge IV at the hand.  Findings: Mostly collateral vessels are seen in the upper arm.  The central venous system does appear to be patent however there does appear to be a stenosis within the axillary vein.  The innominate vein is patent without stenosis and drains into the superior vena cava.    Impression:  #1  The patient has a patent central venous system however there does appear to be narrowing within the axillary vein.  The patient could be considered for left arm access however this would require simultaneous stenting versus angioplasty of the central venous system for placement of a hybrid versus HeRO graft.    Theotis Burrow, M.D., [facility] [place] Office: [phone] Pager:  [phone]

## 2018-11-04 DIAGNOSIS — N2581 Secondary hyperparathyroidism of renal origin: Secondary | ICD-10-CM | POA: Diagnosis not present

## 2018-11-04 DIAGNOSIS — Z992 Dependence on renal dialysis: Secondary | ICD-10-CM | POA: Diagnosis not present

## 2018-11-04 DIAGNOSIS — N186 End stage renal disease: Secondary | ICD-10-CM | POA: Diagnosis not present

## 2018-11-04 DIAGNOSIS — D509 Iron deficiency anemia, unspecified: Secondary | ICD-10-CM | POA: Diagnosis not present

## 2018-11-05 ENCOUNTER — Other Ambulatory Visit: Payer: Self-pay

## 2018-11-05 ENCOUNTER — Ambulatory Visit (INDEPENDENT_AMBULATORY_CARE_PROVIDER_SITE_OTHER): Payer: Medicare Other | Admitting: Physician Assistant

## 2018-11-05 ENCOUNTER — Encounter: Payer: Self-pay | Admitting: Orthopedic Surgery

## 2018-11-05 VITALS — Ht 65.0 in | Wt 158.0 lb

## 2018-11-05 DIAGNOSIS — I739 Peripheral vascular disease, unspecified: Secondary | ICD-10-CM | POA: Diagnosis not present

## 2018-11-05 DIAGNOSIS — Z992 Dependence on renal dialysis: Secondary | ICD-10-CM | POA: Diagnosis not present

## 2018-11-05 DIAGNOSIS — L97411 Non-pressure chronic ulcer of right heel and midfoot limited to breakdown of skin: Secondary | ICD-10-CM | POA: Diagnosis not present

## 2018-11-05 DIAGNOSIS — Z89431 Acquired absence of right foot: Secondary | ICD-10-CM

## 2018-11-05 DIAGNOSIS — N186 End stage renal disease: Secondary | ICD-10-CM

## 2018-11-05 DIAGNOSIS — D509 Iron deficiency anemia, unspecified: Secondary | ICD-10-CM | POA: Diagnosis not present

## 2018-11-05 DIAGNOSIS — N2581 Secondary hyperparathyroidism of renal origin: Secondary | ICD-10-CM | POA: Diagnosis not present

## 2018-11-05 NOTE — Progress Notes (Signed)
Office Visit Note   Patient: Joseph STAILEY Sr.           Date of Birth: [birth year]           MRN: [id number] Visit Date: 11/05/2018              Requested by: [name], MD [address] [place],  [place] [address] PCP: [name], MD  Chief Complaint  Patient presents with  . Right Foot - Follow-up      HPI: The patient is a 78 year old gentleman who is seen for follow-up of his right foot midfoot plantar ulceration over the residual transmetatarsal amputation.  He reports some residual current callus and some scant brownish drainage from the foot.  He reports he is otherwise been doing well.  Assessment & Plan: Visit Diagnoses:  1. Midfoot skin ulcer, right, limited to breakdown of skin ([place])   2. S/P transmetatarsal amputation of foot, right ([place])   3. ESRD on dialysis ([place])   4. PVD (peripheral vascular disease) ([place])     Plan: After informed consent, the right foot plantar ulcer over the residual foot was debrided to healthy bleeding tissue and hemostasis was achieved with silver nitrate. iodosorb ointment was applied to the area and the patient will continue daily dressing changes with silvadene cream.  Follow up in 4 weeks.   Follow-Up Instructions: Return in about 4 weeks (around 12/03/2018).   Ortho Exam  Patient is alert, oriented, no adenopathy, well-dressed, normal affect, normal respiratory effort. Right transmetatarsal amputation site has recurrent callus across all the residual metatarsals on the plantar surface.  This was debrided with a #10 blade knife after informed consent and the patient tolerated this well.  He does have 3 small 2 to 3 mm shallow ulcerations which were debrided to healthy viable bleeding tissue and hemostasis was achieved with silver nitrate.  The overall size of the entire area is approximately 3 x 6 cm with the 3 small ulcerations as noted.  There are no signs of cellulitis of the foot.  He has palpable pedal pulses.  He will  follow-up in 4 weeks.  Imaging: No results found.   Labs: Lab Results  Component Value Date   REPTSTATUS 06/17/2018 FINAL 06/12/2018   GRAMSTAIN  02/18/2017    RARE WBC PRESENT, PREDOMINANTLY PMN FEW GRAM POSITIVE COCCI    CULT  06/12/2018    NO GROWTH 5 DAYS Performed at [facility], [address], [place], [place] [address]      Lab Results  Component Value Date   ALBUMIN 1.9 (L) 06/16/2018   ALBUMIN 2.0 (L) 06/15/2018   ALBUMIN 1.9 (L) 06/14/2018    Lab Results  Component Value Date   MG 2.0 06/17/2018   MG 1.9 06/14/2018   MG 1.8 06/13/2018   No results found for: VD25OH  No results found for: PREALBUMIN CBC EXTENDED Latest Ref Rng & Units 11/03/2018 08/02/2018 06/17/2018  WBC 4.0 - 10.5 K/uL - 9.4 11.9(H)  RBC 4.22 - 5.81 MIL/uL - 5.05 5.81  HGB 13.0 - 17.0 g/dL 18.4(H) 10.2(L) 11.6(L)  HCT 39.0 - 52.0 % 54.0(H) 33.1(L) 36.2(L)  PLT 150 - 400 K/uL - 241 377  NEUTROABS 1.7 - 7.7 K/uL - 6.9 8.4(H)  LYMPHSABS 0.7 - 4.0 K/uL - 1.3 2.0     Body mass index is 26.29 kg/m.  Orders:  No orders of the defined types were placed in this encounter.  No orders of the defined types were placed  in this encounter.    Procedures: No procedures performed  Clinical Data: No additional findings.  ROS:  All other systems negative, except as noted in the HPI. Review of Systems  Objective: Vital Signs: Ht 5\' 5"  (1.651 m)   Wt 158 lb (71.7 kg)   BMI 26.29 kg/m   Specialty Comments:  No specialty comments available.  PMFS History: Patient Active Problem List   Diagnosis Date Noted  . Malnutrition of moderate degree 06/18/2018  . Severe sepsis ([place]) 06/12/2018  . Leukocytosis 06/12/2018  . MRSA (methicillin resistant staph aureus) culture positive 06/12/2018  . Essential hypertension 06/12/2018  . ESRD (end stage renal disease) ([place]) 06/09/2018  . Gangrene of finger ([place]) 06/04/2018  . S/P transmetatarsal amputation of foot, right ([place]) 05/09/2017  . PVD  (peripheral vascular disease) ([place]) 04/15/2017  . Nonischemic cardiomyopathy ([place]) 08/15/2015  . Aortic stenosis 08/15/2015  . Moderate aortic stenosis 07/12/2015  . 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 CAD- 40-50% LAD at cath 07/11/15 07/11/2015  . Aspirin intolerance 07/11/2015  . Abnormal stress test   . ESRD on dialysis ([place]) 05/30/2015  . CTS (carpal tunnel syndrome) 02/28/2015  . PVD of LE - Dr Oneida Alar follows 08/20/2012  . Midfoot skin ulcer, right, limited to breakdown of skin ([place]) 01/16/2012  . Encounter for screening colonoscopy 10/08/2011  . Melena 10/08/2011  . Other complications due to renal dialysis device, implant, and graft 09/24/2011   Past Medical History:  Diagnosis Date  . Anxiety   . Aortic stenosis   . Arthritis   . CVA (cerebral infarction)   . ESRD (end stage renal disease) on dialysis [facility])    M/W/F at [facility] in [place]  . Essential hypertension    resolved with HD  . Gangrene of right foot ([place])   . Gastric ulcer 2004  . History of cardiomyopathy    LVEF normal as of February 2017  . History of stroke    Left side weakness  . Iron deficiency anemia     Family History  Problem Relation Age of Onset  . Hypertension Mother   . Colon cancer Neg Hx   . Liver disease Neg Hx     Past Surgical History:  Procedure Laterality Date  . ABDOMINAL AORTAGRAM N/A 01/24/2012   Procedure: ABDOMINAL Maxcine Ham;  Surgeon: Elam Dutch, MD;  Location: [facility] CATH LAB;  Service: Cardiovascular;  Laterality: N/A;  . ABDOMINAL AORTOGRAM W/LOWER EXTREMITY N/A 04/15/2017   Procedure: ABDOMINAL AORTOGRAM W/LOWER EXTREMITY;  Surgeon: Angelia Mould, MD;  Location: [place] CV LAB;  Service: Cardiovascular;  Laterality: N/A;  . AMPUTATION Right 05/09/2017   Procedure: RIGHT TRANSMETATARSAL AMPUTATION;  Surgeon: [name], MD;  Location: [place];  Service: Orthopedics;  Laterality: Right;  . AMPUTATION Right 06/13/2018   Procedure: AMPUTATION  RIGHT LONG FINGER;  Surgeon: Dayna Barker, MD;  Location: [place];  Service: Plastics;  Laterality: Right;  . ARTERIOVENOUS GRAFT PLACEMENT Right right arm  . AV FISTULA PLACEMENT Left 08/31/2015   Procedure: ARTERIOVENOUS (AV) FISTULA CREATION- LEFT RADIOCEPHALIC;  Surgeon: Mal Misty, MD;  Location: [place];  Service: Vascular;  Laterality: Left;  . AV FISTULA PLACEMENT Right 02/18/2017   Procedure: INSERTION OF ARTERIOVENOUS (AV) GORE-TEX GRAFT  RIGHT UPPER ARM;  Surgeon: Conrad Loleta, MD;  Location: [place];  Service: Vascular;  Laterality: Right;  . BASCILIC VEIN TRANSPOSITION Left 12/19/2015   Procedure: FIRST STAGE BRACHIAL VEIN TRANSPOSITION;  Surgeon: Conrad Manvel, MD;  Location: [facility] OR;  Service: Vascular;  Laterality: Left;  . BASCILIC VEIN TRANSPOSITION Left 02/08/2016   Procedure: SECOND STAGE BRACHIAL VEIN TRANSPOSITION;  Surgeon: Conrad Chugcreek, MD;  Location: [place];  Service: Vascular;  Laterality: Left;  . CARDIAC CATHETERIZATION N/A 07/11/2015   Procedure: Left Heart Cath and Coronary Angiography;  Surgeon: Troy Sine, MD;  Location: [place] CV LAB;  Service: Cardiovascular;  Laterality: N/A;  . Carpel Tunnel Left Dec. 22, 2016  . CHOLECYSTECTOMY    . COLONOSCOPY  2004   Dr. Irving Shows, left sided diverticula and cecal polyp, path unknown  . COLONOSCOPY  10/29/2011   Procedure: COLONOSCOPY;  Surgeon: Daneil Dolin, MD;  Location: [facility];  Service: Endoscopy;  Laterality: N/A;  10:15  . ESOPHAGOGASTRODUODENOSCOPY  11/2002   Dr. Gala Romney, erosive reflux esophagitis, multiple gastric ulcer and antral/bulbar erosions. Serologies positive for H.Pylori and was treated  . ESOPHAGOGASTRODUODENOSCOPY  11/20014   Dr. Gala Romney, small hh only, ulcers healed  . ESOPHAGOGASTRODUODENOSCOPY  09/21/2011   Dr Trevor Iha [facility], antral erosions, ?early GAVE  . FISTULOGRAM Left 12/10/2016   Procedure: THROMBECTOMY OF LEFT ARM ARTERIOVENOUS FISTULA;  Surgeon: Waynetta Sandy, MD;   Location: [place];  Service: Vascular;  Laterality: Left;  . INSERTION OF DIALYSIS CATHETER Left 12/10/2016   Procedure: INSERTION OF TUNNELED DIALYSIS CATHETER;  Surgeon: Waynetta Sandy, MD;  Location: [place];  Service: Vascular;  Laterality: Left;  . INSERTION OF DIALYSIS CATHETER Right 06/09/2018   Procedure: INSERTION OF DIALYSIS CATHETER Right subclavian;  Surgeon: Angelia Mould, MD;  Location: [place];  Service: Vascular;  Laterality: Right;  . IR DIALY SHUNT INTRO [place] W/IMG RIGHT Right 01/01/2018  . IR GENERIC HISTORICAL  07/16/2016   IR REMOVAL TUN CV CATH W/O FL 07/16/2016 Saverio Danker, PA-C [facility]-INTERV RAD  . IR PTA ADDL CENTRAL DIALYSIS SEG THRU DIALY CIRCUIT RIGHT Right 10/21/2017  . IR REMOVAL TUN CV CATH W/O FL  05/12/2017  . IR THROMBECTOMY AV FISTULA W/THROMBOLYSIS/PTA INC/SHUNT/IMG RIGHT Right 10/21/2017  . IR THROMBECTOMY AV FISTULA W/THROMBOLYSIS/PTA INC/SHUNT/IMG RIGHT Right 11/27/2017  . IR US GUIDE VASC ACCESS RIGHT  10/21/2017  . IR US GUIDE VASC ACCESS RIGHT  11/27/2017  . IR US GUIDE VASC ACCESS RIGHT  01/01/2018  . LIGATION ARTERIOVENOUS GORTEX GRAFT Right 06/09/2018  . LIGATION ARTERIOVENOUS GORTEX GRAFT Right 06/09/2018   Procedure: LIGATION ARTERIOVENOUS GORTEX GRAFT RIGHT ARM;  Surgeon: Angelia Mould, MD;  Location: [place];  Service: Vascular;  Laterality: Right;  . LIGATION OF ARTERIOVENOUS  FISTULA Left 12/19/2015   Procedure: LIGATION OF RADIOCEPHALIC ARTERIOVENOUS  FISTULA;  Surgeon: Conrad Animas, MD;  Location: [place];  Service: Vascular;  Laterality: Left;  Marland Kitchen MASS EXCISION Right 02/18/2017   Procedure: EXCISION OF RIGHT AXILLARY EPIDERMAL INCLUSION CYST;  Surgeon: Conrad Stroud, MD;  Location: [place];  Service: Vascular;  Laterality: Right;  . PERIPHERAL VASCULAR CATHETERIZATION N/A 12/14/2015   Procedure: Fistulagram;  Surgeon: Conrad [place], MD;  Location: [place] CV LAB;  Service: Cardiovascular;  Laterality: N/A;  . SHOULDER  SURGERY    . ULTRASOUND GUIDANCE FOR VASCULAR ACCESS  04/15/2017   Procedure: Ultrasound Guidance For Vascular Access;  Surgeon: Angelia Mould, MD;  Location: [place] CV LAB;  Service: Cardiovascular;;  . UPPER EXTREMITY VENOGRAPHY Bilateral 12/17/2016   Procedure: Bilateral Upper Extremity Venography;  Surgeon: Serafina Mitchell, MD;  Location: [place] CV LAB;  Service: Cardiovascular;  Laterality: Bilateral;  .  UPPER EXTREMITY VENOGRAPHY Left 11/03/2018   Procedure: UPPER EXTREMITY VENOGRAPHY;  Surgeon: Serafina Mitchell, MD;  Location: [place] CV LAB;  Service: Cardiovascular;  Laterality: Left;   Social History   Occupational History  . Occupation: retired, Licensed conveyancer    Employer: RETIRED  Tobacco Use  . Smoking status: Former Smoker    Quit date: 04/29/2004    Years since quitting: 14.5  . Smokeless tobacco: Former Systems developer    Types: Chew    Quit date: 01/16/1987  . Tobacco comment: quit 2006  Substance and Sexual Activity  . Alcohol use: No  . Drug use: No  . Sexual activity: Not on file

## 2018-11-06 ENCOUNTER — Encounter: Payer: Self-pay | Admitting: Physician Assistant

## 2018-11-06 DIAGNOSIS — Z992 Dependence on renal dialysis: Secondary | ICD-10-CM | POA: Diagnosis not present

## 2018-11-06 DIAGNOSIS — D509 Iron deficiency anemia, unspecified: Secondary | ICD-10-CM | POA: Diagnosis not present

## 2018-11-06 DIAGNOSIS — N186 End stage renal disease: Secondary | ICD-10-CM | POA: Diagnosis not present

## 2018-11-06 DIAGNOSIS — N2581 Secondary hyperparathyroidism of renal origin: Secondary | ICD-10-CM | POA: Diagnosis not present

## 2018-11-06 DIAGNOSIS — L97521 Non-pressure chronic ulcer of other part of left foot limited to breakdown of skin: Secondary | ICD-10-CM | POA: Diagnosis not present

## 2018-11-06 DIAGNOSIS — I739 Peripheral vascular disease, unspecified: Secondary | ICD-10-CM | POA: Diagnosis not present

## 2018-11-09 DIAGNOSIS — N2581 Secondary hyperparathyroidism of renal origin: Secondary | ICD-10-CM | POA: Diagnosis not present

## 2018-11-09 DIAGNOSIS — D509 Iron deficiency anemia, unspecified: Secondary | ICD-10-CM | POA: Diagnosis not present

## 2018-11-09 DIAGNOSIS — N186 End stage renal disease: Secondary | ICD-10-CM | POA: Diagnosis not present

## 2018-11-09 DIAGNOSIS — Z992 Dependence on renal dialysis: Secondary | ICD-10-CM | POA: Diagnosis not present

## 2018-11-11 DIAGNOSIS — N2581 Secondary hyperparathyroidism of renal origin: Secondary | ICD-10-CM | POA: Diagnosis not present

## 2018-11-11 DIAGNOSIS — D509 Iron deficiency anemia, unspecified: Secondary | ICD-10-CM | POA: Diagnosis not present

## 2018-11-11 DIAGNOSIS — Z992 Dependence on renal dialysis: Secondary | ICD-10-CM | POA: Diagnosis not present

## 2018-11-11 DIAGNOSIS — N186 End stage renal disease: Secondary | ICD-10-CM | POA: Diagnosis not present

## 2018-11-13 DIAGNOSIS — D509 Iron deficiency anemia, unspecified: Secondary | ICD-10-CM | POA: Diagnosis not present

## 2018-11-13 DIAGNOSIS — Z992 Dependence on renal dialysis: Secondary | ICD-10-CM | POA: Diagnosis not present

## 2018-11-13 DIAGNOSIS — N2581 Secondary hyperparathyroidism of renal origin: Secondary | ICD-10-CM | POA: Diagnosis not present

## 2018-11-13 DIAGNOSIS — N186 End stage renal disease: Secondary | ICD-10-CM | POA: Diagnosis not present

## 2018-11-16 DIAGNOSIS — D509 Iron deficiency anemia, unspecified: Secondary | ICD-10-CM | POA: Diagnosis not present

## 2018-11-16 DIAGNOSIS — Z992 Dependence on renal dialysis: Secondary | ICD-10-CM | POA: Diagnosis not present

## 2018-11-16 DIAGNOSIS — N2581 Secondary hyperparathyroidism of renal origin: Secondary | ICD-10-CM | POA: Diagnosis not present

## 2018-11-16 DIAGNOSIS — N186 End stage renal disease: Secondary | ICD-10-CM | POA: Diagnosis not present

## 2018-11-18 DIAGNOSIS — Z992 Dependence on renal dialysis: Secondary | ICD-10-CM | POA: Diagnosis not present

## 2018-11-18 DIAGNOSIS — D509 Iron deficiency anemia, unspecified: Secondary | ICD-10-CM | POA: Diagnosis not present

## 2018-11-18 DIAGNOSIS — N186 End stage renal disease: Secondary | ICD-10-CM | POA: Diagnosis not present

## 2018-11-18 DIAGNOSIS — N2581 Secondary hyperparathyroidism of renal origin: Secondary | ICD-10-CM | POA: Diagnosis not present

## 2018-11-20 DIAGNOSIS — Z992 Dependence on renal dialysis: Secondary | ICD-10-CM | POA: Diagnosis not present

## 2018-11-20 DIAGNOSIS — N2581 Secondary hyperparathyroidism of renal origin: Secondary | ICD-10-CM | POA: Diagnosis not present

## 2018-11-20 DIAGNOSIS — N186 End stage renal disease: Secondary | ICD-10-CM | POA: Diagnosis not present

## 2018-11-20 DIAGNOSIS — D509 Iron deficiency anemia, unspecified: Secondary | ICD-10-CM | POA: Diagnosis not present

## 2018-11-23 DIAGNOSIS — D509 Iron deficiency anemia, unspecified: Secondary | ICD-10-CM | POA: Diagnosis not present

## 2018-11-23 DIAGNOSIS — N186 End stage renal disease: Secondary | ICD-10-CM | POA: Diagnosis not present

## 2018-11-23 DIAGNOSIS — Z992 Dependence on renal dialysis: Secondary | ICD-10-CM | POA: Diagnosis not present

## 2018-11-23 DIAGNOSIS — N2581 Secondary hyperparathyroidism of renal origin: Secondary | ICD-10-CM | POA: Diagnosis not present

## 2018-11-25 ENCOUNTER — Ambulatory Visit (INDEPENDENT_AMBULATORY_CARE_PROVIDER_SITE_OTHER): Payer: Medicare Other | Admitting: Cardiology

## 2018-11-25 ENCOUNTER — Other Ambulatory Visit: Payer: Self-pay

## 2018-11-25 ENCOUNTER — Encounter: Payer: Self-pay | Admitting: Cardiology

## 2018-11-25 ENCOUNTER — Other Ambulatory Visit: Payer: Self-pay | Admitting: *Deleted

## 2018-11-25 ENCOUNTER — Telehealth: Payer: Self-pay | Admitting: Licensed Clinical Social Worker

## 2018-11-25 DIAGNOSIS — N186 End stage renal disease: Secondary | ICD-10-CM

## 2018-11-25 DIAGNOSIS — R55 Syncope and collapse: Secondary | ICD-10-CM | POA: Diagnosis not present

## 2018-11-25 DIAGNOSIS — I2510 Atherosclerotic heart disease of native coronary artery without angina pectoris: Secondary | ICD-10-CM | POA: Diagnosis not present

## 2018-11-25 DIAGNOSIS — D509 Iron deficiency anemia, unspecified: Secondary | ICD-10-CM | POA: Diagnosis not present

## 2018-11-25 DIAGNOSIS — Z992 Dependence on renal dialysis: Secondary | ICD-10-CM

## 2018-11-25 DIAGNOSIS — I350 Nonrheumatic aortic (valve) stenosis: Secondary | ICD-10-CM | POA: Diagnosis not present

## 2018-11-25 DIAGNOSIS — E782 Mixed hyperlipidemia: Secondary | ICD-10-CM | POA: Diagnosis not present

## 2018-11-25 DIAGNOSIS — N2581 Secondary hyperparathyroidism of renal origin: Secondary | ICD-10-CM | POA: Diagnosis not present

## 2018-11-25 NOTE — Patient Instructions (Signed)
Medication Instructions: Your physician recommends that you continue on your current medications as directed. Please refer to the Current Medication list given to you today.   Labwork: none  Procedures/Testing: Your physician has recommended that you wear an event monitor ( 14 day ZIO). Event monitors are medical devices that record the heart's electrical activity. Doctors most often [place] these monitors to diagnose arrhythmias. Arrhythmias are problems with the speed or rhythm of the heartbeat. The monitor is a small, portable device. You can wear one while you do your normal daily activities. This is usually used to diagnose what is causing palpitations/syncope (passing out).  Your physician has requested that you have an echocardiogram. Echocardiography is a painless test that uses sound waves to create images of your heart. It provides your doctor with information about the size and shape of your heart and how well your heart's chambers and valves are working. This procedure takes approximately one hour. There are no restrictions for this procedure.    Follow-Up:InThe office 4-6 weeks with dr.McDowell or Caryl Bis PA-C  Any Additional Special Instructions Will Be Listed Below (If Applicable).     If you need a refill on your cardiac medications before your next appointment, please call your pharmacy.

## 2018-11-25 NOTE — Progress Notes (Signed)
Virtual Visit via Telephone Note   This visit type was conducted due to national recommendations for restrictions regarding the COVID-19 Pandemic (e.g. social distancing) in an effort to limit this patient's exposure and mitigate transmission in our community.  Due to his co-morbid illnesses, this patient is at least at moderate risk for complications without adequate follow up.  This format is felt to be most appropriate for this patient at this time.  The patient did not have access to video technology/had technical difficulties with video requiring transitioning to audio format only (telephone).  All issues noted in this document were discussed and addressed.  No physical exam could be performed with this format.  Please refer to the patient's chart for his  consent to telehealth for [facility].   Date:  11/25/2018   ID:  Joseph Maryland Sr., DOB 06/27/1940, MRN [id number]  Patient Location: Home Provider Location: Office  PCP:  [name], MD  Evaluating Cardiologist: Satira Sark, MD Electrophysiologist:  None   Evaluation Performed:  Follow-Up Visit  Chief Complaint:   Lightheadedness  History of Present Illness:    Joseph Ayoub. is a 78 y.o. male last seen in the office in December 2018, referred back by nephrology for evaluation of intermittent lightheadedness.  No records were provided.  Patient did not have video access today and we spoke by phone.  He tells me that he has been having episodes where he feels lightheaded, as if he might pass out, but the symptoms only last for a few seconds and he never has frank syncope.  This occurs sometimes after his hemodialysis sessions.  It may occur more sporadically at other times, not always while standing.  He does not report any palpitations or chest pain.  Patient has not maintained cardiac follow-up and did not present for the previous echocardiogram ordered in 2018.  I reviewed his medications which are listed below.  He  does use Proamatine with hemodialysis sessions, presumably due to low blood pressure.  I reviewed his he is not on any antihypertensive medications.  The patient does not have symptoms concerning for COVID-19 infection (fever, chills, cough, or new shortness of breath).    Past Medical History:  Diagnosis Date   Anxiety    Aortic stenosis    Arthritis    CVA (cerebral infarction)    ESRD (end stage renal disease) on dialysis ([place])    M/W/F at [facility] in [place] hypertension    resolved with HD   Gangrene of right foot ([place])    Gastric ulcer 2004   History of cardiomyopathy    LVEF normal as of February 2017   History of stroke    Left side weakness   Iron deficiency anemia    Past Surgical History:  Procedure Laterality Date   ABDOMINAL AORTAGRAM N/A 01/24/2012   Procedure: ABDOMINAL Maxcine Ham;  Surgeon: Elam Dutch, MD;  Location: [facility] CATH LAB;  Service: Cardiovascular;  Laterality: N/A;   ABDOMINAL AORTOGRAM W/LOWER EXTREMITY N/A 04/15/2017   Procedure: ABDOMINAL AORTOGRAM W/LOWER EXTREMITY;  Surgeon: Angelia Mould, MD;  Location: [place] CV LAB;  Service: Cardiovascular;  Laterality: N/A;   AMPUTATION Right 05/09/2017   Procedure: RIGHT TRANSMETATARSAL AMPUTATION;  Surgeon: [name], MD;  Location: [place];  Service: Orthopedics;  Laterality: Right;   AMPUTATION Right 06/13/2018   Procedure: AMPUTATION RIGHT LONG FINGER;  Surgeon: Dayna Barker, MD;  Location: [place];  Service: Plastics;  Laterality: Right;  ARTERIOVENOUS GRAFT PLACEMENT Right right arm   AV FISTULA PLACEMENT Left 08/31/2015   Procedure: ARTERIOVENOUS (AV) FISTULA CREATION- LEFT RADIOCEPHALIC;  Surgeon: Mal Misty, MD;  Location: [place];  Service: Vascular;  Laterality: Left;   AV FISTULA PLACEMENT Right 02/18/2017   Procedure: INSERTION OF ARTERIOVENOUS (AV) GORE-TEX GRAFT  RIGHT UPPER ARM;  Surgeon: Conrad [place], MD;  Location: [place];  Service: Vascular;   Laterality: Right;   [place] Left 12/19/2015   Procedure: FIRST STAGE BRACHIAL VEIN TRANSPOSITION;  Surgeon: Conrad [place], MD;  Location: [place];  Service: Vascular;  Laterality: Left;   [place] Left 02/08/2016   Procedure: SECOND STAGE BRACHIAL VEIN TRANSPOSITION;  Surgeon: Conrad Nimrod, MD;  Location: [place];  Service: Vascular;  Laterality: Left;   CARDIAC CATHETERIZATION N/A 07/11/2015   Procedure: Left Heart Cath and Coronary Angiography;  Surgeon: Troy Sine, MD;  Location: [place] CV LAB;  Service: Cardiovascular;  Laterality: N/A;   [place] Left Dec. 22, 2016   CHOLECYSTECTOMY     COLONOSCOPY  2004   Dr. Irving Shows, left sided diverticula and cecal polyp, path unknown   COLONOSCOPY  10/29/2011   Procedure: COLONOSCOPY;  Surgeon: Daneil Dolin, MD;  [facility];  Service: Endoscopy;  Laterality: N/A;  10:15   ESOPHAGOGASTRODUODENOSCOPY  11/2002   Dr. Gala Romney, erosive reflux esophagitis, multiple gastric ulcer and antral/bulbar erosions. Serologies positive for H.Pylori and was treated   ESOPHAGOGASTRODUODENOSCOPY  11/20014   Dr. Gala Romney, small hh only, ulcers healed   ESOPHAGOGASTRODUODENOSCOPY  09/21/2011   Dr Trevor Iha [facility], antral erosions, ?early [place]   FISTULOGRAM Left 12/10/2016   Procedure: THROMBECTOMY OF LEFT ARM ARTERIOVENOUS FISTULA;  Surgeon: Waynetta Sandy, MD;  Location: [place];  Service: Vascular;  Laterality: Left;   INSERTION OF DIALYSIS CATHETER Left 12/10/2016   Procedure: INSERTION OF TUNNELED DIALYSIS CATHETER;  Surgeon: Waynetta Sandy, MD;  Location: [place];  Service: Vascular;  Laterality: Left;   INSERTION OF DIALYSIS CATHETER Right 06/09/2018   Procedure: INSERTION OF DIALYSIS CATHETER Right subclavian;  Surgeon: Angelia Mould, MD;  Location: [place];  Service: Vascular;  Laterality: Right;   IR DIALY SHUNT INTRO [place] W/IMG RIGHT Right 01/01/2018    IR GENERIC HISTORICAL  07/16/2016   IR REMOVAL TUN CV CATH W/O FL 07/16/2016 Saverio Danker, PA-C [facility]-INTER[facility] RAD   IR PTA ADDL CENTRAL DIALYSIS SEG THRU DIALY CIRCUIT RIGHT Right 10/21/2017   IR REMOVAL TUN CV CATH W/O FL  05/12/2017   IR THROMBECTOMY AV FISTULA W/THROMBOLYSIS/PTA INC/SHUNT/IMG RIGHT Right 10/21/2017   IR THROMBECTOMY AV FISTULA W/THROMBOLYSIS/PTA INC/SHUNT/IMG RIGHT Right 11/27/2017   IR US GUIDE VASC ACCESS RIGHT  10/21/2017   IR US GUIDE VASC ACCESS RIGHT  11/27/2017   IR US GUIDE VASC ACCESS RIGHT  01/01/2018   LIGATION ARTERIOVENOUS GORTEX GRAFT Right 06/09/2018   LIGATION ARTERIOVENOUS GORTEX GRAFT Right 06/09/2018   Procedure: LIGATION ARTERIOVENOUS GORTEX GRAFT RIGHT ARM;  Surgeon: Angelia Mould, MD;  Location: [place];  Service: Vascular;  Laterality: Right;   LIGATION OF ARTERIOVENOUS  FISTULA Left 12/19/2015   Procedure: LIGATION OF RADIOCEPHALIC ARTERIOVENOUS  FISTULA;  Surgeon: Conrad [place], MD;  Location: [place];  Service: Vascular;  Laterality: Left;   MASS EXCISION Right 02/18/2017   Procedure: EXCISION OF RIGHT AXILLARY EPIDERMAL INCLUSION CYST;  Surgeon: Conrad [place], MD;  Location: [place];  Service: Vascular;  Laterality: Right;   PERIPHERAL VASCULAR  CATHETERIZATION N/A 12/14/2015   Procedure: Fistulagram;  Surgeon: Conrad [place], MD;  Location: [place] CV LAB;  Service: Cardiovascular;  Laterality: N/A;   SHOULDER SURGERY     ULTRASOUND GUIDANCE FOR VASCULAR ACCESS  04/15/2017   Procedure: Ultrasound Guidance For Vascular Access;  Surgeon: Angelia Mould, MD;  Location: [place] CV LAB;  Service: Cardiovascular;;   UPPER EXTREMITY VENOGRAPHY Bilateral 12/17/2016   Procedure: Bilateral Upper Extremity Venography;  Surgeon: Serafina Mitchell, MD;  Location: [place] CV LAB;  Service: Cardiovascular;  Laterality: Bilateral;   UPPER EXTREMITY VENOGRAPHY Left 11/03/2018   Procedure: UPPER EXTREMITY VENOGRAPHY;  Surgeon: Serafina Mitchell, MD;   Location: [place] CV LAB;  Service: Cardiovascular;  Laterality: Left;     Current Meds  Medication Sig   atorvastatin (LIPITOR) 40 MG tablet Take 40 mg by mouth daily.   b complex-vitamin c-folic acid (NEPHRO-VITE) 0.8 MG TABS Take 1 tablet by mouth See admin instructions. Take one tablet daily on Tuesdays, Thursdays, Saturdays, and Sundays.   cinacalcet (SENSIPAR) 60 MG tablet Take 60 mg by mouth every evening.    meclizine (ANTIVERT) 25 MG tablet Take 25 mg by mouth 2 (two) times daily as needed for dizziness or nausea.    midodrine (PROAMATINE) 10 MG tablet Take 10 mg by mouth See admin instructions. Take one tablet before dialysis.   Nutritional Supplements (FEEDING SUPPLEMENT, NEPRO CARB STEADY,) LIQD Take 237 mLs by mouth 2 (two) times daily between meals.   sevelamer (RENVELA) 800 MG tablet Take 1,600-3,200 mg by mouth See admin instructions. Take 3200  mg by mouth 3 times daily with full meals and take 1600 mg by mouth with snacks.   silver sulfADIAZINE (SILVADENE) 1 % cream Apply 1 application topically daily.     Allergies:   Aspirin   Social History   Tobacco Use   Smoking status: Former Smoker    Quit date: 04/29/2004    Years since quitting: 14.5   Smokeless tobacco: Former Systems developer    Types: [place] date: 01/16/1987   Tobacco comment: quit 2006  Substance Use Topics   Alcohol use: No   Drug use: No     Family Hx: The patient's family history includes Hypertension in his mother. There is no history of Colon cancer or Liver disease.  ROS:   Please see the history of present illness. All other systems reviewed and are negative.   Prior CV studies:   The following studies were reviewed today:  Echocardiogram 06/27/2015: Study Conclusions   - Left ventricle: The cavity size was mildly dilated. Wall thickness was increased in a pattern of moderate LVH. There was mild focal basal hypertrophy of the septum. Systolic function was normal. The  estimated ejection fraction was in the range of 60% to 65%. Wall motion was normal; there were no regional wall motion abnormalities. Doppler parameters are consistent with abnormal left ventricular relaxation (grade 1 diastolic dysfunction). - Aortic valve: There was moderate stenosis. There was trivial regurgitation. Valve area (VTI): 0.89 cm^2. Valve area (Vmax): 1.02 cm^2. Valve area (Vmean): 0.91 cm^2. - Mitral valve: Calcified annulus. Mildly thickened leaflets . There was mild regurgitation. - Left atrium: The atrium was moderately dilated.  Cardiac catheterization 07/11/2015:  Prox LAD lesion, 45% stenosed.  There is moderate to severe left ventricular systolic dysfunction.  Nonischemic cardiomyopathy with global hypokinesis with more pronounced inferior hypocontractility and overall ejection fraction at 35%.  Mild coronary calcification with 40-50% eccentric proximal  LAD stenosis just prior to a septal perforating artery; mild calcification of the proximal left circumflex coronary artery; and large dominant RCA without significant obstructive stenoses.  Probably at least mild aortic stenosis with LV to AO gradient ranging from 10 -18 mm Hg in this patient with reduced LV function; LVEDP 13 mm Hg.  Labs/Other Tests and Data Reviewed:    EKG:  An ECG dated 08/02/2018 was personally reviewed today and demonstrated:  Sinus tachycardia with right bundle branch block and PVCs.  Recent Labs: 06/13/2018: ALT 7 06/17/2018: Magnesium 2.0 08/02/2018: Platelets 241 11/03/2018: BUN 29; Creatinine, Ser 10.00; Hemoglobin 18.4; Potassium 4.2; Sodium 133    Wt Readings from Last 3 Encounters:  11/05/18 158 lb (71.7 kg)  11/03/18 158 lb (71.7 kg)  10/29/18 156 lb (70.8 kg)     Objective:    Vital Signs:  There were no vitals taken for this visit.   He does not have a scale or otherwise way to check vital signs. Patient spoke in full sentences, not short of breath. No  audible wheezing or coughing. Normal speech pattern.  ASSESSMENT & PLAN:    1.  Intermittent lightheadedness, near syncope without obvious precipitant.  States that it does not always occur while standing, seems to happen more frequently after hemodialysis sessions though.  He is not on any antihypertensive medications, uses Proamatine with his hemodialysis sessions.  He does have the episodes at other times as well.  We will investigate for any potential arrhythmia genic causes with a 14-day ZIO patch.  He does have evidence of conduction system disease with right bundle branch block by ECG in April.  2.  History of moderate aortic stenosis by echocardiogram in 2017.  I expect that this has advanced over the years, he did not follow-up for prior echocardiogram in 2018.  We will arrange a repeat study for reevaluation.  3.  ESRD on hemodialysis.  4.  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  COVID-19 Education: The signs and symptoms of COVID-19 were discussed with the patient and how to seek care for testing (follow up with PCP or arrange E-visit).  The importance of social distancing was discussed today.  Time:   Today, I have spent 8 minutes with the patient with telehealth technology discussing the above problems.     Medication Adjustments/Labs and Tests Ordered: Current medicines are reviewed at length with the patient today.  Concerns regarding medicines are outlined above.   Tests Ordered: Orders Placed This Encounter  Procedures   Cardiac event monitor   ECHOCARDIOGRAM COMPLETE    Medication Changes: No orders of the defined types were placed in this encounter.   Follow Up:  In Person 4 to 6 weeks in the [place] office.  Signed, Rozann Lesches, MD  11/25/2018 11:46 AM    [place]

## 2018-11-25 NOTE — Telephone Encounter (Signed)
CSW referred to assist patient with obtaining a BP cuff. CSW contacted patient to inform cuff will be delivered to home. Patient grateful for support and assistance. CSW available as needed. Jackie Ashritha Desrosiers, LCSW, CCSW-MCS [phone]  

## 2018-11-27 ENCOUNTER — Encounter (HOSPITAL_COMMUNITY): Payer: Self-pay | Admitting: *Deleted

## 2018-11-27 ENCOUNTER — Other Ambulatory Visit: Payer: Self-pay

## 2018-11-27 ENCOUNTER — Other Ambulatory Visit (HOSPITAL_COMMUNITY)
Admission: RE | Admit: 2018-11-27 | Discharge: 2018-11-27 | Disposition: A | Discharge status: Home / Self Care | Payer: Medicare Other | Source: Ambulatory Visit | Attending: Vascular Surgery | Admitting: Vascular Surgery

## 2018-11-27 DIAGNOSIS — Z20828 Contact with and (suspected) exposure to other viral communicable diseases: Secondary | ICD-10-CM | POA: Diagnosis not present

## 2018-11-27 DIAGNOSIS — N2581 Secondary hyperparathyroidism of renal origin: Secondary | ICD-10-CM | POA: Diagnosis not present

## 2018-11-27 DIAGNOSIS — I739 Peripheral vascular disease, unspecified: Secondary | ICD-10-CM | POA: Diagnosis not present

## 2018-11-27 DIAGNOSIS — L97521 Non-pressure chronic ulcer of other part of left foot limited to breakdown of skin: Secondary | ICD-10-CM | POA: Diagnosis not present

## 2018-11-27 DIAGNOSIS — N186 End stage renal disease: Secondary | ICD-10-CM | POA: Diagnosis not present

## 2018-11-27 DIAGNOSIS — Z992 Dependence on renal dialysis: Secondary | ICD-10-CM | POA: Diagnosis not present

## 2018-11-27 DIAGNOSIS — D509 Iron deficiency anemia, unspecified: Secondary | ICD-10-CM | POA: Diagnosis not present

## 2018-11-27 LAB — SARS CORONAVIRUS 2 (TAT 6-24 HRS): SARS Coronavirus 2: NEGATIVE

## 2018-11-27 NOTE — Progress Notes (Signed)
Anesthesia Chart Review: SAME DAY WORK-UP    Case: [id number] Date/Time: 12/01/18 0915   Procedure: INSERTION OF ARTERIOVENOUS (AV) GORE-TEX GRAFT ARM (Left )   Anesthesia type: Choice   Pre-op diagnosis: end stage renal disease   Location: [facility] OR ROOM 11 / [place] OR   Surgeon: Angelia Mould, MD      DISCUSSION: Patient is a 78 year old male scheduled for the above procedure. He is currently getting hemodialyzed via a right Hamilton vein tunneled catheter.  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    Patient was evaluated (via telemedicine) seen by cardiologist Dr. Domenic Polite on 11/25/18 for lightheadedness/near syncope. Episodes more frequent after HD. On midodrine for presumed hypotension with dialysis. Given evidence of conduction system disease with RBBB and moderate AS in 2017, a 14 day ZioPatch and repeat echo ordered. These have not been done yet and there is no mention of surgery plans, so I will reach out to Dr. Domenic Polite to clarify timing. Echo is currently scheduled for 12/08/18.  I reached out to Dr. Domenic Polite about surgery plans given pending tests. Discussed this type of case typically done with local and MAC, but occasional require general/LMA. If procedure done under local or IV sedation, he felt patient could probably go ahead with procedure without preoperative testing, as even finding of severe AS may not necessarily hold things up. (If he did have worsening AS then would likely need to be transitioned for catheter to permanent HD access before intervention.)     Reviewed with anesthesiologist Hoy Morn, MD. If labs  acceptable and no acute changes/symptoms then would anticipate that he could proceed as planned.  Anesthesia team to evaluate on the day of surgery.  Definitive plan at that time.  Presurgical COVID test in process.   PROVIDERS: [name], MD is listed as PCP Fran Lowes, MD is nephrologist Rozann Lesches, MD is cardiologist   LABS: He is for updated labs prior to surgery.   IMAGES: CXR 08/02/18: IMPRESSION: 1. No acute cardiopulmonary disease. 2. Thickened bronchovascular markings and interstitial markings are chronic, stable from prior studies dating to 03/31/2017.    EKG: 08/02/18:  Sinus tachycardia Multiform ventricular premature complexes Right bundle branch block No significant change since last tracing 13 Jun 2018 Confirmed by [name] [phone]) on 08/02/2018 6:16:11 AM   CV: Cardiac cath 07/11/15 (done for abnormal 06/27/15 stress test, EF 36%, apical-inferior perfusion defect, inferior hypokinesis):  Prox LAD lesion, 45% stenosed.  There is moderate to severe left ventricular systolic dysfunction. - Nonischemic cardiomyopathy with global hypokinesis with more pronounced inferior hypocontractility and overall ejection fraction at 35%. - Mild coronary calcification with 40-50% eccentric proximal LAD stenosis just prior to a septal perforating artery; mild calcification of the proximal left circumflex coronary artery; and large dominant RCA without significant obstructive stenoses. - Probably at least mild aortic stenosis with LV to AO gradient ranging from 10 -18 mm Hg in this patient with reduced LV function; LVEDP 13 mm Hg.  RECOMMENDATION: Medical therapy for his cardiomyopathy and mild CAD.  Consider a dobutamine stress echo if concern for low gradient AS with reduced LV function.  Echo 06/27/15: Study Conclusions - Left ventricle:  The cavity size was mildly dilated. Wall   thickness was increased in a pattern of moderate LVH. There was   mild focal  basal hypertrophy of the septum. Systolic function was   normal. The estimated ejection fraction was in the range of 60%   to 65%. Wall motion was normal; there were no regional wall   motion abnormalities. Doppler parameters are consistent with   abnormal left ventricular relaxation (grade 1 diastolic   dysfunction). - Aortic valve: There was moderate stenosis. There was trivial   regurgitation. Valve area (VTI): 0.89 cm^2. Valve area (Vmax):   1.02 cm^2. Valve area (Vmean): 0.91 cm^2. - Mitral valve: Calcified annulus. Mildly thickened leaflets .   There was mild regurgitation. - Left atrium: The atrium was moderately dilated.   Past Medical History:  Diagnosis Date  . Anxiety   . Aortic stenosis   . Arthritis   . CVA (cerebral infarction)   . ESRD (end stage renal disease) on dialysis [facility])    M/W/F at [facility] in [place]  . Essential hypertension    resolved with HD  . Gangrene of right foot ([place])   . Gastric ulcer 2004  . History of cardiomyopathy    LVEF normal as of February 2017  . History of stroke    Left side weakness  . Iron deficiency anemia     Past Surgical History:  Procedure Laterality Date  . ABDOMINAL AORTAGRAM N/A 01/24/2012   Procedure: ABDOMINAL Maxcine Ham;  Surgeon: Elam Dutch, MD;  Location: [facility] CATH LAB;  Service: Cardiovascular;  Laterality: N/A;  . ABDOMINAL AORTOGRAM W/LOWER EXTREMITY N/A 04/15/2017   Procedure: ABDOMINAL AORTOGRAM W/LOWER EXTREMITY;  Surgeon: Angelia Mould, MD;  Location: [place] CV LAB;  Service: Cardiovascular;  Laterality: N/A;  . AMPUTATION Right 05/09/2017   Procedure: RIGHT TRANSMETATARSAL AMPUTATION;  Surgeon: [name], MD;  Location: [place];  Service: Orthopedics;  Laterality: Right;  . AMPUTATION Right 06/13/2018   Procedure: AMPUTATION RIGHT LONG FINGER;  Surgeon: Dayna Barker, MD;  Location: [place];  Service: Plastics;  Laterality: Right;  . ARTERIOVENOUS GRAFT PLACEMENT Right right arm  . AV  FISTULA PLACEMENT Left 08/31/2015   Procedure: ARTERIOVENOUS (AV) FISTULA CREATION- LEFT RADIOCEPHALIC;  Surgeon: Mal Misty, MD;  Location: [place];  Service: Vascular;  Laterality: Left;  . AV FISTULA PLACEMENT Right 02/18/2017   Procedure: INSERTION OF ARTERIOVENOUS (AV) GORE-TEX GRAFT  RIGHT UPPER ARM;  Surgeon: Conrad Nassau, MD;  Location: [place];  Service: Vascular;  Laterality: Right;  . BASCILIC VEIN TRANSPOSITION Left 12/19/2015   Procedure: FIRST STAGE BRACHIAL VEIN TRANSPOSITION;  Surgeon: Conrad Clay, MD;  Location: [place];  Service: Vascular;  Laterality: Left;  . BASCILIC VEIN TRANSPOSITION Left 02/08/2016   Procedure: SECOND STAGE BRACHIAL VEIN TRANSPOSITION;  Surgeon: Conrad Hollandale, MD;  Location: [place];  Service: Vascular;  Laterality: Left;  . CARDIAC CATHETERIZATION N/A 07/11/2015   Procedure: Left Heart Cath and Coronary Angiography;  Surgeon: Troy Sine, MD;  Location: [place] CV LAB;  Service: Cardiovascular;  Laterality: N/A;  . Carpel Tunnel Left Dec. 22, 2016  . CHOLECYSTECTOMY    . COLONOSCOPY  2004   Dr. Irving Shows, left sided diverticula and cecal polyp, path unknown  . COLONOSCOPY  10/29/2011   Procedure: COLONOSCOPY;  Surgeon: Daneil Dolin, MD;  [facility];  Service: Endoscopy;  Laterality: N/A;  10:15  . ESOPHAGOGASTRODUODENOSCOPY  11/2002   Dr. Gala Romney, erosive reflux esophagitis,  multiple gastric ulcer and antral/bulbar erosions. Serologies positive for H.Pylori and was treated  . ESOPHAGOGASTRODUODENOSCOPY  11/20014   Dr. Gala Romney, small hh only, ulcers healed  . ESOPHAGOGASTRODUODENOSCOPY  09/21/2011   Dr Trevor Iha [facility], antral erosions, ?early GAVE  . FISTULOGRAM Left 12/10/2016   Procedure: THROMBECTOMY OF LEFT ARM ARTERIOVENOUS FISTULA;  Surgeon: Waynetta Sandy, MD;  Location: [place];  Service: Vascular;  Laterality: Left;  . INSERTION OF DIALYSIS CATHETER Left 12/10/2016   Procedure: INSERTION OF TUNNELED DIALYSIS CATHETER;  Surgeon:  Waynetta Sandy, MD;  Location: [place];  Service: Vascular;  Laterality: Left;  . INSERTION OF DIALYSIS CATHETER Right 06/09/2018   Procedure: INSERTION OF DIALYSIS CATHETER Right subclavian;  Surgeon: Angelia Mould, MD;  Location: [place];  Service: Vascular;  Laterality: Right;  . IR DIALY SHUNT INTRO [place] W/IMG RIGHT Right 01/01/2018  . IR GENERIC HISTORICAL  07/16/2016   IR REMOVAL TUN CV CATH W/O FL 07/16/2016 Saverio Danker, PA-C [facility]-INTERV RAD  . IR PTA ADDL CENTRAL DIALYSIS SEG THRU DIALY CIRCUIT RIGHT Right 10/21/2017  . IR REMOVAL TUN CV CATH W/O FL  05/12/2017  . IR THROMBECTOMY AV FISTULA W/THROMBOLYSIS/PTA INC/SHUNT/IMG RIGHT Right 10/21/2017  . IR THROMBECTOMY AV FISTULA W/THROMBOLYSIS/PTA INC/SHUNT/IMG RIGHT Right 11/27/2017  . IR US GUIDE VASC ACCESS RIGHT  10/21/2017  . IR US GUIDE VASC ACCESS RIGHT  11/27/2017  . IR US GUIDE VASC ACCESS RIGHT  01/01/2018  . LIGATION ARTERIOVENOUS GORTEX GRAFT Right 06/09/2018  . LIGATION ARTERIOVENOUS GORTEX GRAFT Right 06/09/2018   Procedure: LIGATION ARTERIOVENOUS GORTEX GRAFT RIGHT ARM;  Surgeon: Angelia Mould, MD;  Location: [place];  Service: Vascular;  Laterality: Right;  . LIGATION OF ARTERIOVENOUS  FISTULA Left 12/19/2015   Procedure: LIGATION OF RADIOCEPHALIC ARTERIOVENOUS  FISTULA;  Surgeon: Conrad Greendale, MD;  Location: [place];  Service: Vascular;  Laterality: Left;  Marland Kitchen MASS EXCISION Right 02/18/2017   Procedure: EXCISION OF RIGHT AXILLARY EPIDERMAL INCLUSION CYST;  Surgeon: Conrad Carrollton, MD;  Location: [place];  Service: Vascular;  Laterality: Right;  . PERIPHERAL VASCULAR CATHETERIZATION N/A 12/14/2015   Procedure: Fistulagram;  Surgeon: Conrad , MD;  Location: [place] CV LAB;  Service: Cardiovascular;  Laterality: N/A;  . SHOULDER SURGERY    . ULTRASOUND GUIDANCE FOR VASCULAR ACCESS  04/15/2017   Procedure: Ultrasound Guidance For Vascular Access;  Surgeon: Angelia Mould, MD;  Location: [place] CV LAB;  Service: Cardiovascular;;  . UPPER EXTREMITY VENOGRAPHY Bilateral 12/17/2016   Procedure: Bilateral Upper Extremity Venography;  Surgeon: Serafina Mitchell, MD;  Location: [place] CV LAB;  Service: Cardiovascular;  Laterality: Bilateral;  . UPPER EXTREMITY VENOGRAPHY Left 11/03/2018   Procedure: UPPER EXTREMITY VENOGRAPHY;  Surgeon: Serafina Mitchell, MD;  Location: [place] CV LAB;  Service: Cardiovascular;  Laterality: Left;    MEDICATIONS: No current facility-administered medications for this encounter.    Marland Kitchen atorvastatin (LIPITOR) 40 MG tablet  . b complex-vitamin c-folic acid (NEPHRO-VITE) 0.8 MG TABS  . cinacalcet (SENSIPAR) 60 MG tablet  . meclizine (ANTIVERT) 25 MG tablet  . midodrine (PROAMATINE) 10 MG tablet  . nitroGLYCERIN (NITRODUR - DOSED IN MG/24 HR) 0.2 mg/hr patch  . Nutritional Supplements (FEEDING SUPPLEMENT, NEPRO CARB STEADY,) LIQD  . sevelamer (RENVELA) 800 MG tablet  . silver sulfADIAZINE (SILVADENE) 1 % cream     Myra Gianotti, PA-C Surgical Short Stay/Anesthesiology [facility] Phone [phone] [facility] Phone [phone] 11/27/2018 4:40 PM

## 2018-11-27 NOTE — Anesthesia Preprocedure Evaluation (Addendum)
Anesthesia Evaluation  Patient identified by MRN, date of birth, ID band Patient awake    Reviewed: Allergy & Precautions, NPO status , Patient's Chart, lab work & pertinent test results  History of Anesthesia Complications Negative for: history of anesthetic complications  Airway Mallampati: II  TM Distance: >3 FB Neck ROM: Full    Dental  (+) Dental Advisory Given   Pulmonary neg recent URI, former smoker,    breath sounds clear to auscultation       Cardiovascular hypertension, + CAD and + Peripheral Vascular Disease   Rhythm:Regular     Neuro/Psych PSYCHIATRIC DISORDERS Anxiety  Neuromuscular disease CVA    GI/Hepatic PUD,   Endo/Other    Renal/GU Renal disease     Musculoskeletal  (+) Arthritis ,   Abdominal   Peds  Hematology  (+) anemia ,   Anesthesia Other Findings   Reproductive/Obstetrics                                                             Anesthesia Evaluation  Patient identified by MRN, date of birth, ID band Patient awake    Reviewed: Allergy & Precautions, H&P , NPO status , Patient's Chart, lab work & pertinent test results  Airway Mallampati: II  TM Distance: >3 FB Neck ROM: Full    Dental no notable dental hx. (+) Edentulous Upper, Edentulous Lower, Dental Advisory Given   Pulmonary neg pulmonary ROS, former smoker,    Pulmonary exam normal breath sounds clear to auscultation       Cardiovascular hypertension, + Peripheral Vascular Disease  + Valvular Problems/Murmurs AS  Rhythm:Regular Rate:Normal     Neuro/Psych Anxiety negative neurological ROS     GI/Hepatic Neg liver ROS, PUD,   Endo/Other  negative endocrine ROS  Renal/GU ESRF and DialysisRenal disease  negative genitourinary   Musculoskeletal  (+) Arthritis , Osteoarthritis,    Abdominal   Peds  Hematology  (+) Blood dyscrasia, anemia ,   Anesthesia Other  Findings   Reproductive/Obstetrics negative OB ROS                             Anesthesia Physical Anesthesia Plan  ASA: IV  Anesthesia Plan: MAC and Regional   Post-op Pain Management:    Induction: Intravenous  PONV Risk Score and Plan: 1 and Ondansetron  Airway Management Planned: Simple Face Mask  Additional Equipment:   Intra-op Plan:   Post-operative Plan:   Informed Consent: I have reviewed the patients History and Physical, chart, labs and discussed the procedure including the risks, benefits and alternatives for the proposed anesthesia with the patient or authorized representative who has indicated his/her understanding and acceptance.     Dental advisory given  Plan Discussed with: CRNA  Anesthesia Plan Comments: (Pt with sepsis secondary to gangrene of the hand. H/o moderate AS in 2017 with no further studies that I am aware of. Plan is to get the patient through surgery with a peripheral nerve block and not much sedation. Pt aware he will be aware in the OR but that he is too sick to put to sleep.)       Anesthesia Quick Evaluation  Anesthesia Evaluation  Patient identified by MRN, date of birth, ID band Patient awake    Reviewed: Allergy & Precautions, H&P , NPO status , Patient's Chart, lab work & pertinent test results  Airway Mallampati: II  TM Distance: >3 FB Neck ROM: Full    Dental no notable dental hx. (+) Edentulous Upper, Edentulous Lower, Dental Advisory Given   Pulmonary neg pulmonary ROS, former smoker,    Pulmonary exam normal breath sounds clear to auscultation       Cardiovascular hypertension, + Peripheral Vascular Disease  + Valvular Problems/Murmurs AS  Rhythm:Regular Rate:Normal     Neuro/Psych Anxiety negative neurological ROS     GI/Hepatic Neg liver ROS, PUD,   Endo/Other  negative endocrine ROS  Renal/GU ESRF and DialysisRenal disease   negative genitourinary   Musculoskeletal  (+) Arthritis , Osteoarthritis,    Abdominal   Peds  Hematology  (+) Blood dyscrasia, anemia ,   Anesthesia Other Findings   Reproductive/Obstetrics negative OB ROS                             Anesthesia Physical Anesthesia Plan  ASA: IV  Anesthesia Plan: MAC and Regional   Post-op Pain Management:    Induction: Intravenous  PONV Risk Score and Plan: 1 and Ondansetron  Airway Management Planned: Simple Face Mask  Additional Equipment:   Intra-op Plan:   Post-operative Plan:   Informed Consent: I have reviewed the patients History and Physical, chart, labs and discussed the procedure including the risks, benefits and alternatives for the proposed anesthesia with the patient or authorized representative who has indicated his/her understanding and acceptance.     Dental advisory given  Plan Discussed with: CRNA  Anesthesia Plan Comments: (Pt with sepsis secondary to gangrene of the hand. H/o moderate AS in 2017 with no further studies that I am aware of. Plan is to get the patient through surgery with a peripheral nerve block and not much sedation. Pt aware he will be aware in the OR but that he is too sick to put to sleep.)       Anesthesia Quick Evaluation  Anesthesia Physical Anesthesia Plan  ASA: IV  Anesthesia Plan: MAC   Post-op Pain Management:    Induction: Intravenous  PONV Risk Score and Plan: 1 and Treatment may vary due to age or medical condition and Propofol infusion  Airway Management Planned: Nasal Cannula  Additional Equipment: None  Intra-op Plan:   Post-operative Plan:   Informed Consent: I have reviewed the patients History and Physical, chart, labs and discussed the procedure including the risks, benefits and alternatives for the proposed anesthesia with the patient or authorized representative who has indicated his/her understanding and acceptance.      Dental advisory given  Plan Discussed with: CRNA and Surgeon  Anesthesia Plan Comments: (PAT note written 11/27/2018 by Myra Gianotti, PA-C. )       Anesthesia Quick Evaluation

## 2018-11-27 NOTE — Progress Notes (Signed)
Mr [name] denies chest pain or shortness of breath.  Patient's cardiologist has patient scheduled for an ECHO and heart monitor on 12/08/2018.I asked [name] , PA- C to review. Mr [name] reports that he does not have anyone to stay with him when he goes home the day of surgery. I sent a message to [facility] at [facility] informing her of this. Mr [name]  Had covid 19 test today and in in quarantine at home alone.

## 2018-11-28 DIAGNOSIS — N186 End stage renal disease: Secondary | ICD-10-CM | POA: Diagnosis not present

## 2018-11-28 DIAGNOSIS — D509 Iron deficiency anemia, unspecified: Secondary | ICD-10-CM | POA: Diagnosis not present

## 2018-11-28 DIAGNOSIS — N2581 Secondary hyperparathyroidism of renal origin: Secondary | ICD-10-CM | POA: Diagnosis not present

## 2018-11-28 DIAGNOSIS — Z992 Dependence on renal dialysis: Secondary | ICD-10-CM | POA: Diagnosis not present

## 2018-11-30 DIAGNOSIS — N186 End stage renal disease: Secondary | ICD-10-CM | POA: Diagnosis not present

## 2018-11-30 DIAGNOSIS — D509 Iron deficiency anemia, unspecified: Secondary | ICD-10-CM | POA: Diagnosis not present

## 2018-11-30 DIAGNOSIS — Z992 Dependence on renal dialysis: Secondary | ICD-10-CM | POA: Diagnosis not present

## 2018-11-30 DIAGNOSIS — N2581 Secondary hyperparathyroidism of renal origin: Secondary | ICD-10-CM | POA: Diagnosis not present

## 2018-12-01 ENCOUNTER — Encounter (HOSPITAL_COMMUNITY)
Admission: RE | Disposition: A | Discharge status: Home / Self Care | Payer: Self-pay | Source: Home / Self Care | Attending: Vascular Surgery

## 2018-12-01 ENCOUNTER — Observation Stay (HOSPITAL_COMMUNITY)
Admission: RE | Admit: 2018-12-01 | Discharge: 2018-12-02 | Disposition: A | Discharge status: Home / Self Care | Payer: Medicare Other | Attending: Vascular Surgery | Admitting: Vascular Surgery

## 2018-12-01 ENCOUNTER — Ambulatory Visit (HOSPITAL_COMMUNITY): Payer: Medicare Other | Admitting: Vascular Surgery

## 2018-12-01 ENCOUNTER — Other Ambulatory Visit: Payer: Self-pay

## 2018-12-01 ENCOUNTER — Encounter (HOSPITAL_COMMUNITY): Payer: Self-pay

## 2018-12-01 DIAGNOSIS — X58XXXA Exposure to other specified factors, initial encounter: Secondary | ICD-10-CM | POA: Insufficient documentation

## 2018-12-01 DIAGNOSIS — I739 Peripheral vascular disease, unspecified: Secondary | ICD-10-CM | POA: Insufficient documentation

## 2018-12-01 DIAGNOSIS — Z7901 Long term (current) use of anticoagulants: Secondary | ICD-10-CM | POA: Diagnosis not present

## 2018-12-01 DIAGNOSIS — E46 Unspecified protein-calorie malnutrition: Secondary | ICD-10-CM | POA: Insufficient documentation

## 2018-12-01 DIAGNOSIS — N2581 Secondary hyperparathyroidism of renal origin: Secondary | ICD-10-CM | POA: Insufficient documentation

## 2018-12-01 DIAGNOSIS — I428 Other cardiomyopathies: Secondary | ICD-10-CM | POA: Insufficient documentation

## 2018-12-01 DIAGNOSIS — I350 Nonrheumatic aortic (valve) stenosis: Secondary | ICD-10-CM | POA: Diagnosis not present

## 2018-12-01 DIAGNOSIS — I120 Hypertensive chronic kidney disease with stage 5 chronic kidney disease or end stage renal disease: Secondary | ICD-10-CM | POA: Diagnosis not present

## 2018-12-01 DIAGNOSIS — F419 Anxiety disorder, unspecified: Secondary | ICD-10-CM | POA: Diagnosis not present

## 2018-12-01 DIAGNOSIS — Z8711 Personal history of peptic ulcer disease: Secondary | ICD-10-CM | POA: Diagnosis not present

## 2018-12-01 DIAGNOSIS — N185 Chronic kidney disease, stage 5: Secondary | ICD-10-CM | POA: Diagnosis not present

## 2018-12-01 DIAGNOSIS — Z8614 Personal history of Methicillin resistant Staphylococcus aureus infection: Secondary | ICD-10-CM | POA: Diagnosis not present

## 2018-12-01 DIAGNOSIS — S61401A Unspecified open wound of right hand, initial encounter: Secondary | ICD-10-CM | POA: Insufficient documentation

## 2018-12-01 DIAGNOSIS — Z992 Dependence on renal dialysis: Secondary | ICD-10-CM | POA: Insufficient documentation

## 2018-12-01 DIAGNOSIS — M1990 Unspecified osteoarthritis, unspecified site: Secondary | ICD-10-CM | POA: Insufficient documentation

## 2018-12-01 DIAGNOSIS — Z87891 Personal history of nicotine dependence: Secondary | ICD-10-CM | POA: Insufficient documentation

## 2018-12-01 DIAGNOSIS — I69354 Hemiplegia and hemiparesis following cerebral infarction affecting left non-dominant side: Secondary | ICD-10-CM | POA: Diagnosis not present

## 2018-12-01 DIAGNOSIS — Z79899 Other long term (current) drug therapy: Secondary | ICD-10-CM | POA: Insufficient documentation

## 2018-12-01 DIAGNOSIS — D509 Iron deficiency anemia, unspecified: Secondary | ICD-10-CM | POA: Diagnosis not present

## 2018-12-01 DIAGNOSIS — N186 End stage renal disease: Secondary | ICD-10-CM | POA: Diagnosis present

## 2018-12-01 DIAGNOSIS — I2510 Atherosclerotic heart disease of native coronary artery without angina pectoris: Secondary | ICD-10-CM | POA: Insufficient documentation

## 2018-12-01 HISTORY — DX: Gastrointestinal hemorrhage, unspecified: K92.2

## 2018-12-01 HISTORY — DX: Personal history of peptic ulcer disease: Z87.11

## 2018-12-01 HISTORY — DX: Personal history of other medical treatment: Z92.89

## 2018-12-01 HISTORY — PX: AV FISTULA PLACEMENT: SHX1204

## 2018-12-01 LAB — PROTIME-INR
INR: 1.1 (ref 0.8–1.2)
Prothrombin Time: 13.9 seconds (ref 11.4–15.2)

## 2018-12-01 LAB — POCT I-STAT 4, (NA,K, GLUC, HGB,HCT)
Glucose, Bld: 81 mg/dL (ref 70–99)
HCT: 57 % — ABNORMAL HIGH (ref 39.0–52.0)
Hemoglobin: 19.4 g/dL — ABNORMAL HIGH (ref 13.0–17.0)
Potassium: 4.2 mmol/L (ref 3.5–5.1)
Sodium: 137 mmol/L (ref 135–145)

## 2018-12-01 LAB — COMPREHENSIVE METABOLIC PANEL
ALT: 11 U/L (ref 0–44)
AST: 17 U/L (ref 15–41)
Albumin: 3 g/dL — ABNORMAL LOW (ref 3.5–5.0)
Alkaline Phosphatase: 55 U/L (ref 38–126)
Anion gap: 17 — ABNORMAL HIGH (ref 5–15)
BUN: 34 mg/dL — ABNORMAL HIGH (ref 8–23)
CO2: 24 mmol/L (ref 22–32)
Calcium: 8.9 mg/dL (ref 8.9–10.3)
Chloride: 97 mmol/L — ABNORMAL LOW (ref 98–111)
Creatinine, Ser: 9.83 mg/dL — ABNORMAL HIGH (ref 0.61–1.24)
GFR calc Af Amer: 5 mL/min — ABNORMAL LOW (ref >60)
GFR calc non Af Amer: 5 mL/min — ABNORMAL LOW (ref >60)
Glucose, Bld: 91 mg/dL (ref 70–99)
Potassium: 4.3 mmol/L (ref 3.5–5.1)
Sodium: 138 mmol/L (ref 135–145)
Total Bilirubin: 0.5 mg/dL (ref 0.3–1.2)
Total Protein: 6.9 g/dL (ref 6.5–8.1)

## 2018-12-01 LAB — SURGICAL PCR SCREEN
MRSA, PCR: POSITIVE — AB
Staphylococcus aureus: POSITIVE — AB

## 2018-12-01 LAB — CBC
HCT: 50.5 % (ref 39.0–52.0)
Hemoglobin: 16.1 g/dL (ref 13.0–17.0)
MCH: 21.4 pg — ABNORMAL LOW (ref 26.0–34.0)
MCHC: 31.9 g/dL (ref 30.0–36.0)
MCV: 67.1 fL — ABNORMAL LOW (ref 80.0–100.0)
Platelets: UNDETERMINED 10*3/uL (ref 150–400)
RBC: 7.53 MIL/uL — ABNORMAL HIGH (ref 4.22–5.81)
RDW: 19.2 % — ABNORMAL HIGH (ref 11.5–15.5)
WBC: 7.9 10*3/uL (ref 4.0–10.5)
nRBC: 0 % (ref 0.0–0.2)

## 2018-12-01 SURGERY — INSERTION OF ARTERIOVENOUS (AV) GORE-TEX GRAFT ARM
Anesthesia: Monitor Anesthesia Care | Site: Arm Upper | Laterality: Left

## 2018-12-01 MED ORDER — ONDANSETRON HCL 4 MG/2ML IJ SOLN: 4.0000 mg | Freq: Four times a day (QID) | INTRAMUSCULAR | Status: DC | PRN

## 2018-12-01 MED ORDER — OXYCODONE-ACETAMINOPHEN 5-325 MG PO TABS
1.0000 | ORAL_TABLET | ORAL | Status: DC | PRN
  Administered 2018-12-01: 14:00:00 2 via ORAL
  Filled 2018-12-01: qty 2

## 2018-12-01 MED ORDER — MIDODRINE HCL 5 MG PO TABS
10.0000 mg | ORAL_TABLET | ORAL | Status: DC
  Administered 2018-12-02 (×2): 10 mg via ORAL
  Filled 2018-12-01 (×2): qty 2

## 2018-12-01 MED ORDER — PHENYLEPHRINE 40 MCG/ML (10ML) SYRINGE FOR IV PUSH (FOR BLOOD PRESSURE SUPPORT)
PREFILLED_SYRINGE | INTRAVENOUS | Status: DC | PRN
  Administered 2018-12-01: 80 ug via INTRAVENOUS

## 2018-12-01 MED ORDER — CINACALCET HCL 30 MG PO TABS
60.0000 mg | ORAL_TABLET | Freq: Every evening | ORAL | Status: DC
  Administered 2018-12-01: 60 mg via ORAL
  Filled 2018-12-01: qty 2

## 2018-12-01 MED ORDER — PHENYLEPHRINE 40 MCG/ML (10ML) SYRINGE FOR IV PUSH (FOR BLOOD PRESSURE SUPPORT)
PREFILLED_SYRINGE | INTRAVENOUS | Status: AC
  Filled 2018-12-01: qty 10

## 2018-12-01 MED ORDER — PROTAMINE SULFATE 10 MG/ML IV SOLN
INTRAVENOUS | Status: DC | PRN
  Administered 2018-12-01: 40 mg via INTRAVENOUS

## 2018-12-01 MED ORDER — STERILE WATER FOR IRRIGATION IR SOLN
Status: DC | PRN
  Administered 2018-12-01: 1000 mL

## 2018-12-01 MED ORDER — PROPOFOL 500 MG/50ML IV EMUL
INTRAVENOUS | Status: DC | PRN
  Administered 2018-12-01: 100 ug/kg/min via INTRAVENOUS

## 2018-12-01 MED ORDER — CEFAZOLIN SODIUM-DEXTROSE 2-4 GM/100ML-% IV SOLN
INTRAVENOUS | Status: AC
  Filled 2018-12-01: qty 100

## 2018-12-01 MED ORDER — ENOXAPARIN SODIUM 30 MG/0.3ML ~~LOC~~ SOLN
30.0000 mg | SUBCUTANEOUS | Status: DC
  Administered 2018-12-01: 22:00:00 30 mg via SUBCUTANEOUS
  Filled 2018-12-01: qty 0.3

## 2018-12-01 MED ORDER — SODIUM CHLORIDE 0.9 % IV SOLN
INTRAVENOUS | Status: DC | PRN
  Administered 2018-12-01: 50 ug/min via INTRAVENOUS

## 2018-12-01 MED ORDER — FENTANYL CITRATE (PF) 250 MCG/5ML IJ SOLN
INTRAMUSCULAR | Status: DC | PRN
  Administered 2018-12-01: 50 ug via INTRAVENOUS

## 2018-12-01 MED ORDER — CHLORHEXIDINE GLUCONATE CLOTH 2 % EX PADS
6.0000 | MEDICATED_PAD | Freq: Every day | CUTANEOUS | Status: DC
  Administered 2018-12-02: 6 via TOPICAL

## 2018-12-01 MED ORDER — MECLIZINE HCL 25 MG PO TABS: 25.0000 mg | ORAL_TABLET | Freq: Two times a day (BID) | ORAL | Status: DC | PRN

## 2018-12-01 MED ORDER — LIDOCAINE HCL (PF) 1 % IJ SOLN
INTRAMUSCULAR | Status: AC
  Filled 2018-12-01: qty 30

## 2018-12-01 MED ORDER — MIDAZOLAM HCL 5 MG/5ML IJ SOLN
INTRAMUSCULAR | Status: DC | PRN
  Administered 2018-12-01 (×2): 1 mg via INTRAVENOUS

## 2018-12-01 MED ORDER — NITROGLYCERIN 0.2 MG/HR TD PT24: 0.2000 mg | MEDICATED_PATCH | Freq: Every day | TRANSDERMAL | Status: DC

## 2018-12-01 MED ORDER — MIDAZOLAM HCL 2 MG/2ML IJ SOLN
INTRAMUSCULAR | Status: AC
  Filled 2018-12-01: qty 2

## 2018-12-01 MED ORDER — OXYCODONE-ACETAMINOPHEN 5-325 MG PO TABS: 1.0000 | ORAL_TABLET | Freq: Four times a day (QID) | ORAL | 0 refills | Status: DC | PRN

## 2018-12-01 MED ORDER — HYDRALAZINE HCL 20 MG/ML IJ SOLN: 5.0000 mg | INTRAMUSCULAR | Status: DC | PRN

## 2018-12-01 MED ORDER — SEVELAMER CARBONATE 800 MG PO TABS
3200.0000 mg | ORAL_TABLET | Freq: Three times a day (TID) | ORAL | Status: DC
  Administered 2018-12-01 (×2): 3200 mg via ORAL
  Filled 2018-12-01 (×2): qty 4

## 2018-12-01 MED ORDER — POTASSIUM CHLORIDE CRYS ER 20 MEQ PO TBCR: 20.0000 meq | EXTENDED_RELEASE_TABLET | Freq: Once | ORAL | Status: DC

## 2018-12-01 MED ORDER — PROPOFOL 1000 MG/100ML IV EMUL
INTRAVENOUS | Status: AC
  Filled 2018-12-01: qty 100

## 2018-12-01 MED ORDER — MUPIROCIN 2 % EX OINT
1.0000 "application " | TOPICAL_OINTMENT | Freq: Two times a day (BID) | CUTANEOUS | Status: DC
  Administered 2018-12-01 (×2): 1 via NASAL
  Filled 2018-12-01: qty 22

## 2018-12-01 MED ORDER — FENTANYL CITRATE (PF) 250 MCG/5ML IJ SOLN
INTRAMUSCULAR | Status: AC
  Filled 2018-12-01: qty 5

## 2018-12-01 MED ORDER — ALUM & MAG HYDROXIDE-SIMETH 200-200-20 MG/5ML PO SUSP: 15.0000 mL | ORAL | Status: DC | PRN

## 2018-12-01 MED ORDER — PROPOFOL 10 MG/ML IV BOLUS
INTRAVENOUS | Status: AC
  Filled 2018-12-01: qty 20

## 2018-12-01 MED ORDER — GUAIFENESIN-DM 100-10 MG/5ML PO SYRP: 15.0000 mL | ORAL_SOLUTION | ORAL | Status: DC | PRN

## 2018-12-01 MED ORDER — ALBUMIN HUMAN 5 % IV SOLN
12.5000 g | Freq: Once | INTRAVENOUS | Status: AC
  Administered 2018-12-01: 12:00:00 12.5 g via INTRAVENOUS

## 2018-12-01 MED ORDER — SODIUM CHLORIDE 0.9 % IV SOLN
INTRAVENOUS | Status: DC
  Administered 2018-12-01: 07:00:00 via INTRAVENOUS

## 2018-12-01 MED ORDER — LIDOCAINE HCL (PF) 1 % IJ SOLN
INTRAMUSCULAR | Status: DC | PRN
  Administered 2018-12-01: 10 mL

## 2018-12-01 MED ORDER — LIDOCAINE 2% (20 MG/ML) 5 ML SYRINGE
INTRAMUSCULAR | Status: AC
  Filled 2018-12-01: qty 5

## 2018-12-01 MED ORDER — SEVELAMER CARBONATE 800 MG PO TABS: 1600.0000 mg | ORAL_TABLET | Freq: Three times a day (TID) | ORAL | Status: DC | PRN

## 2018-12-01 MED ORDER — SODIUM CHLORIDE 0.9 % IV SOLN: 250.0000 mL | INTRAVENOUS | Status: DC | PRN

## 2018-12-01 MED ORDER — RENA-VITE PO TABS
1.0000 | ORAL_TABLET | Freq: Every day | ORAL | Status: DC
  Administered 2018-12-01: 22:00:00 1 via ORAL
  Filled 2018-12-01: qty 1

## 2018-12-01 MED ORDER — PANTOPRAZOLE SODIUM 40 MG PO TBEC
40.0000 mg | DELAYED_RELEASE_TABLET | Freq: Every day | ORAL | Status: DC
  Administered 2018-12-01: 14:00:00 40 mg via ORAL
  Filled 2018-12-01: qty 1

## 2018-12-01 MED ORDER — SODIUM CHLORIDE 0.9% FLUSH: 3.0000 mL | INTRAVENOUS | Status: DC | PRN

## 2018-12-01 MED ORDER — SODIUM CHLORIDE 0.9% FLUSH
3.0000 mL | Freq: Two times a day (BID) | INTRAVENOUS | Status: DC
  Administered 2018-12-01 (×2): 3 mL via INTRAVENOUS

## 2018-12-01 MED ORDER — ALBUMIN HUMAN 5 % IV SOLN
INTRAVENOUS | Status: AC
  Filled 2018-12-01: qty 250

## 2018-12-01 MED ORDER — ATORVASTATIN CALCIUM 40 MG PO TABS
40.0000 mg | ORAL_TABLET | Freq: Every day | ORAL | Status: DC
  Administered 2018-12-01: 18:00:00 40 mg via ORAL
  Filled 2018-12-01 (×2): qty 1

## 2018-12-01 MED ORDER — LIDOCAINE-EPINEPHRINE (PF) 1 %-1:200000 IJ SOLN
INTRAMUSCULAR | Status: AC
  Filled 2018-12-01: qty 30

## 2018-12-01 MED ORDER — SODIUM CHLORIDE 0.9 % IV SOLN
INTRAVENOUS | Status: DC | PRN
  Administered 2018-12-01: 500 mL

## 2018-12-01 MED ORDER — THROMBIN (RECOMBINANT) 20000 UNITS EX SOLR
CUTANEOUS | Status: AC
  Filled 2018-12-01: qty 20000

## 2018-12-01 MED ORDER — CEFAZOLIN SODIUM-DEXTROSE 2-4 GM/100ML-% IV SOLN
2.0000 g | INTRAVENOUS | Status: AC
  Administered 2018-12-01: 2 g via INTRAVENOUS

## 2018-12-01 MED ORDER — LIDOCAINE-EPINEPHRINE (PF) 1 %-1:200000 IJ SOLN
INTRAMUSCULAR | Status: DC | PRN
  Administered 2018-12-01: 30 mL

## 2018-12-01 MED ORDER — METOPROLOL TARTRATE 5 MG/5ML IV SOLN: 2.0000 mg | INTRAVENOUS | Status: DC | PRN

## 2018-12-01 MED ORDER — NEPRO/CARBSTEADY PO LIQD
237.0000 mL | Freq: Two times a day (BID) | ORAL | Status: DC
  Administered 2018-12-01: 14:00:00 237 mL via ORAL

## 2018-12-01 MED ORDER — LABETALOL HCL 5 MG/ML IV SOLN: 10.0000 mg | INTRAVENOUS | Status: DC | PRN

## 2018-12-01 MED ORDER — HEPARIN SODIUM (PORCINE) 1000 UNIT/ML IJ SOLN
INTRAMUSCULAR | Status: DC | PRN
  Administered 2018-12-01: 7000 [IU] via INTRAVENOUS

## 2018-12-01 MED ORDER — SODIUM CHLORIDE 0.9 % IV SOLN
INTRAVENOUS | Status: AC
  Filled 2018-12-01: qty 1.2

## 2018-12-01 MED ORDER — PHENOL 1.4 % MT LIQD: 1.0000 | OROMUCOSAL | Status: DC | PRN

## 2018-12-01 MED ORDER — 0.9 % SODIUM CHLORIDE (POUR BTL) OPTIME
TOPICAL | Status: DC | PRN
  Administered 2018-12-01: 1000 mL

## 2018-12-01 SURGICAL SUPPLY — 42 items
ADH SKN CLS APL DERMABOND .7 (GAUZE/BANDAGES/DRESSINGS) ×1
ARMBAND PINK RESTRICT EXTREMIT (MISCELLANEOUS) ×4 IMPLANT
CANISTER SUCT 3000ML PPV (MISCELLANEOUS) ×2 IMPLANT
CANNULA VESSEL 3MM 2 BLNT TIP (CANNULA) ×2 IMPLANT
CLIP VESOCCLUDE MED 6/CT (CLIP) ×2 IMPLANT
CLIP VESOCCLUDE SM WIDE 6/CT (CLIP) ×2 IMPLANT
COVER WAND RF STERILE (DRAPES) ×2 IMPLANT
DECANTER SPIKE VIAL GLASS SM (MISCELLANEOUS) ×2 IMPLANT
DERMABOND ADVANCED (GAUZE/BANDAGES/DRESSINGS) ×1
DERMABOND ADVANCED .7 DNX12 (GAUZE/BANDAGES/DRESSINGS) ×1 IMPLANT
DRAPE HALF SHEET 40X57 (DRAPES) ×3 IMPLANT
ELECT REM PT RETURN 9FT ADLT (ELECTROSURGICAL) ×2
ELECTRODE REM PT RTRN 9FT ADLT (ELECTROSURGICAL) ×1 IMPLANT
GLOVE BIO SURGEON STRL SZ 6.5 (GLOVE) ×1 IMPLANT
GLOVE BIO SURGEON STRL SZ7.5 (GLOVE) ×3 IMPLANT
GLOVE BIOGEL PI IND STRL 6.5 (GLOVE) IMPLANT
GLOVE BIOGEL PI IND STRL 8 (GLOVE) ×1 IMPLANT
GLOVE BIOGEL PI INDICATOR 6.5 (GLOVE) ×1
GLOVE BIOGEL PI INDICATOR 8 (GLOVE) ×2
GLOVE SS BIOGEL STRL SZ 8 (GLOVE) IMPLANT
GLOVE SUPERSENSE BIOGEL SZ 8 (GLOVE) ×1
GLOVE SURG SS PI 7.0 STRL IVOR (GLOVE) ×1 IMPLANT
GOWN STRL REUS W/ TWL LRG LVL3 (GOWN DISPOSABLE) ×3 IMPLANT
GOWN STRL REUS W/TWL LRG LVL3 (GOWN DISPOSABLE) ×6
GRAFT GORETEX STRT 4-7X45 (Vascular Products) ×1 IMPLANT
KIT BASIN OR (CUSTOM PROCEDURE TRAY) ×2 IMPLANT
KIT TURNOVER KIT B (KITS) ×2 IMPLANT
NS IRRIG 1000ML POUR BTL (IV SOLUTION) ×2 IMPLANT
PACK CV ACCESS (CUSTOM PROCEDURE TRAY) ×2 IMPLANT
PAD ARMBOARD 7.5X6 YLW CONV (MISCELLANEOUS) ×4 IMPLANT
SPONGE LAP 18X18 RF (DISPOSABLE) ×1 IMPLANT
SPONGE SURGIFOAM ABS GEL 100 (HEMOSTASIS) IMPLANT
SUT PROLENE 6 0 BV (SUTURE) ×7 IMPLANT
SUT SILK 2 0 (SUTURE) ×2
SUT SILK 2-0 18XBRD TIE 12 (SUTURE) IMPLANT
SUT VIC AB 3-0 SH 27 (SUTURE) ×6
SUT VIC AB 3-0 SH 27X BRD (SUTURE) ×2 IMPLANT
SUT VICRYL 4-0 PS2 18IN ABS (SUTURE) ×4 IMPLANT
SYR TOOMEY 50ML (SYRINGE) IMPLANT
TOWEL GREEN STERILE (TOWEL DISPOSABLE) ×2 IMPLANT
UNDERPAD 30X30 (UNDERPADS AND DIAPERS) ×2 IMPLANT
WATER STERILE IRR 1000ML POUR (IV SOLUTION) ×2 IMPLANT

## 2018-12-01 NOTE — Op Note (Signed)
    NAME: Joseph CUFFEE Sr.    MRN: [id number] DOB: [birth year]    DATE OF OPERATION: 12/01/2018  PREOP DIAGNOSIS:    End-stage renal disease  POSTOP DIAGNOSIS:    Same  PROCEDURE:    New left upper arm AV graft  SURGEON: Judeth Cornfield. Scot Dock, MD, FACS  ASSIST: Linus Orn, SA  ANESTHESIA: Local with sedation  EBL: Minimal  INDICATIONS:    Joseph NILL Sr. is a 78 y.o. male who presents for new access.  FINDINGS:   5 mm upper arm brachial vein.  Large 5 mm brachial artery  TECHNIQUE:   The patient was taken to the operating room and sedated by anesthesia.  The left upper extremity was prepped and draped in usual sterile fashion.  After the skin was anesthetized with 1% lidocaine, an incision was made over the brachial artery just above the antecubital level.  Here the artery was dissected free.  It was quite large.  Separate longitudinal incision was made beneath the axilla after the skin was anesthetized.  Here the high brachial vein was dissected free.  A 4-7 mm PTFE graft was tunneled between the 2 incisions and the patient was heparinized.  The brachial artery was clamped proximally and distally and a longitudinal arteriotomy was made.  A short segment of the 4 mm into the graft was excised, the graft slightly spatulated, and sewn end-to-side to the brachial artery using continuous 6-0 Prolene suture.  The graft sent for the appropriate length for anastomosis to the high brachial vein.  The vein was ligated distally and spatulated proximally.  The graft cut the appropriate length spatulated and sewn into into the vein using continuous 6-0 Prolene suture.  At the completion was an excellent thrill in the graft.  There was a good radial signal with the Doppler.  The heparin was partially reversed with protamine.  Each of the wounds was closed with a deep layer of 3-0 Vicryl and the skin closed with 4-0 Vicryl.  Dermabond was applied.  The patient tolerated the procedure well and  was transferred to the recovery room in stable condition.  All needle and sponge counts were correct.  Deitra Mayo, MD, FACS [facility]  DATE OF DICTATION:   12/01/2018

## 2018-12-01 NOTE — Progress Notes (Addendum)
[facility] Progress Note   Subjective:   Patient is a 78 year old male admitted for creation of new LUE AVG today by Dr. Scot Dock. Patient is being admitted overnight and nephrology was consulted for dialysis tomorrow. Patient normally dialyzes at [facility] on MWF. He reports his treatment time is 5 AM and he knows he will not be able to get to the center for dialysis tomorrow, even if it is rescheduled for the afternoon, and he would like to get dialysis here prior to discharge. He denies any recent issues with dialysis. Denies SOB, dyspnea, cough, CP, palpitations, abdominal pain, N/V/D. No pain of surgery site. Currently dialyzing through a tunneled dialysis catheter. BP is controlled, K+ 4.2, Hgb 19.4.   Objective Vitals:   12/01/18 1138 12/01/18 1139 12/01/18 1213 12/01/18 1215  BP:   124/77 124/77  Pulse:  84 83 (!) 25  Resp:  (!) 22 16 16   Temp:   98.7 F (37.1 C)   TempSrc:   Oral   SpO2: 100% 100% 96% 93%  Weight:      Height:       Physical Exam General: Well developed male. Eating lunch. Alert and in NAD Heart: RRR, no murmurs, rubs or gallops Lungs: CTA bilaterally without wheezing, rhonchi or rales Abdomen: Soft, non-tender, non-distended. + BS Extremities: No peripheral edema. Dialysis Access:  TDC, new LUE AVF + thrill  Additional Objective Labs: Basic Metabolic Panel: Recent Labs  Lab 12/01/18 0707  NA 137  K 4.2  GLUCOSE 81   CBC: Recent Labs  Lab 12/01/18 0707  HGB 19.4*  HCT 57.0*   Blood Culture    Component Value Date/Time   SDES LEFT ANTECUBITAL DRAWN BY RN 06/12/2018 1146   SPECREQUEST  06/12/2018 1146    BOTTLES DRAWN AEROBIC AND ANAEROBIC Blood Culture adequate volume   CULT  06/12/2018 1146    NO GROWTH 5 DAYS Performed at [facility], [address] [place], [place] [address]    REPTSTATUS 06/17/2018 FINAL 06/12/2018 1146    Cardiac Enzymes: No results for input(s): CKTOTAL, CKMB, CKMBINDEX, TROPONINI in the  last 168 hours. CBG: No results for input(s): GLUCAP in the last 168 hours. Iron Studies: No results for input(s): IRON, TIBC, TRANSFERRIN, FERRITIN in the last 72 hours. @lablastinr3 @ Studies/Results: No results found. Medications: . sodium chloride     . atorvastatin  40 mg Oral q1800  . cinacalcet  60 mg Oral QPM  . enoxaparin (LOVENOX) injection  30 mg Subcutaneous Q24H  . feeding supplement (NEPRO CARB STEADY)  237 mL Oral BID BM  . [START ON 12/02/2018] midodrine  10 mg Oral Q M,W,F-HD  . multivitamin  1 tablet Oral QHS  . mupirocin ointment  1 application Nasal BID  . pantoprazole  40 mg Oral Daily  . potassium chloride  20-40 mEq Oral Once  . sevelamer carbonate  3,200 mg Oral TID WC  . sodium chloride flush  3 mL Intravenous Q12H    Dialysis Orders: [facility]  T:60 min; EDW 71.5kg; Heparin 500 unit load and 2000 units/hour; BFR 400, DFR 800, 2K/2.5Ca;  R IJ TDC [name] 2 mcg IV    Assessment/Plan:  1. ESRD: MWF schedule, requests dialysis here tomorrow prior to discharge as he will not be able to get to his outpatient center on time. Will plan for HD first shift. K+ 4.2, no emergent indication for dialysis today. No heparin tomorrow as patient is post-op.  2. HTN/volume:  Blood pressure controlled. No  evidence of volume overload on exam. Will plan HD tomorrow to [facility]. 3. Anemia: Hgb 19.4. No ESA. Follow up at outpatient dialysis center. 4. Secondary hyperparathyroidism:  No labs available, continue home meds and follow up with [facility] as scheduled.  5. Nutrition:  Renal diet.   Anice Paganini, PA-C 12/01/2018, 1:50 PM  [facility] Pager: [phone]  Patient seen and examined, agree with above note with above modifications.  Pt strongly desires inpatient dialysis tomorrow after needing to stay in hospital due to transportation issues.   That will be the plan, HD here first shift  Corliss Parish, MD 12/01/2018

## 2018-12-01 NOTE — Discharge Instructions (Signed)
° °  [facility]  Discharge Instructions  AV Fistula or Graft Surgery for Dialysis Access  Please refer to the following instructions for your post-procedure care. Your surgeon or physician assistant will discuss any changes with you.  Activity  You may drive the day following your surgery, if you are comfortable and no longer taking prescription pain medication. Resume full activity as the soreness in your incision resolves.  Bathing/Showering  You may shower after you go home. Keep your incision dry for 48 hours. Do not soak in a bathtub, hot tub, or swim until the incision heals completely. You may not shower if you have a hemodialysis catheter.  Incision Care  Clean your incision with mild soap and water after 48 hours. Pat the area dry with a clean towel. You do not need a bandage unless otherwise instructed. Do not apply any ointments or creams to your incision. You may have skin glue on your incision. Do not peel it off. It will come off on its own in about one week. Your arm may swell a bit after surgery. To reduce swelling use pillows to elevate your arm so it is above your heart. Your doctor will tell you if you need to lightly wrap your arm with an ACE bandage.  Diet  Resume your normal diet. There are not special food restrictions following this procedure. In order to heal from your surgery, it is CRITICAL to get adequate nutrition. Your body requires vitamins, minerals, and protein. Vegetables are the best source of vitamins and minerals. Vegetables also provide the perfect balance of protein. Processed food has little nutritional value, so try to avoid this.  Medications  Resume taking all of your medications. If your incision is causing pain, you may take over-the counter pain relievers such as acetaminophen (Tylenol). If you were prescribed a stronger pain medication, please be aware these medications can cause nausea and constipation. Prevent  nausea by taking the medication with a snack or meal. Avoid constipation by drinking plenty of fluids and eating foods with high amount of fiber, such as fruits, vegetables, and grains.  Do not take Tylenol if you are taking prescription pain medications.  Follow up Your surgeon may want to see you in the office following your access surgery. If so, this will be arranged at the time of your surgery.  Please call us immediately for any of the following conditions:  Increased pain, redness, drainage (pus) from your incision site Fever of 101 degrees or higher Severe or worsening pain at your incision site Hand pain or numbness.  Reduce your risk of vascular disease:  Stop smoking. If you would like help, call [facility] at 1-800-QUIT-NOW (703) 203-1976) or [place] at [place] your cholesterol Maintain a desired weight Control your diabetes Keep your blood pressure down  Dialysis  It will take several weeks to several months for your new dialysis access to be ready for use. Your surgeon will determine when it is okay to use it. Your nephrologist will continue to direct your dialysis. You can continue to use your Permcath until your new access is ready for use.   12/01/2018 Joseph WIGGER SrMarland Kitchen [id number] 1941/02/28  Surgeon(s): Angelia Mould, MD  Procedure(s): INSERTION OF ARTERIOVENOUS (AV) GORE-TEX GRAFT ARM  x Do not stick graft for 4 weeks    If you have any questions, please call the office at [phone].

## 2018-12-01 NOTE — Progress Notes (Signed)
VASCULAR SURGERY:  I spoke to the patient's dialysis center in [place].  He will dialyze at 10 AM tomorrow instead of 5 AM.  Deitra Mayo, MD, [place] [phone] Office: [phone]

## 2018-12-01 NOTE — Transfer of Care (Signed)
Immediate Anesthesia Transfer of Care Note  Patient: Joseph Maryland Sr.  Procedure(s) Performed: INSERTION OF ARTERIOVENOUS (AV) GORE-TEX GRAFT ARM (Left Arm Upper)  Patient Location: PACU  Anesthesia Type:MAC  Level of Consciousness: drowsy and patient cooperative  Airway & Oxygen Therapy: Patient Spontanous Breathing and Patient connected to nasal cannula oxygen  Post-op Assessment: Report given to RN, Post -op Vital signs reviewed and stable and Patient moving all extremities  Post vital signs: Reviewed and stable  Last Vitals:  Vitals Value Taken Time  BP 99/61 12/01/18 1106  Temp    Pulse 86 12/01/18 1109  Resp 18 12/01/18 1109  SpO2 100 % 12/01/18 1109  Vitals shown include unvalidated device data.  Last Pain:  Vitals:   12/01/18 0657  TempSrc:   PainSc: 0-No pain      Patients Stated Pain Goal: 3 (86/76/72 0947)  Complications: No apparent anesthesia complications

## 2018-12-01 NOTE — H&P (Signed)
Patient name: Joseph COMAS Sr. MRN: [id number]        DOB: [birth year]          Sex: male  REASON FOR VISIT:   To evaluate for new hemodialysis access.  The consult is requested by Dr. Lowanda Foster.  HPI:   Joseph BEICHNER Sr. is a pleasant 78 y.o. 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  On 06/09/2018, he underwent placement of a right subclavian vein tunneled dialysis catheter and ligation of his right upper arm graft.  Of note this patient had had a previous central venogram which showed no central venous occlusion on the left.  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  This patient has had multiple previous access procedures.  Most recently had the right arm graft ligated for steal.  This was an upper arm graft.  On the left side he has had a brachial vein transposition which failed and also a previous left radiocephalic fistula which failed.  He previously had wounds on the right foot and ultimately required a transmetatarsal amputation.  He is undergone an arteriogram which shows diffuse calcific disease and anterior tibial runoff only with no real options for revascularization back in 2018.  Since his graft was ligated he required amputation of the involved finger on the right hand and this is healing.  He denies any recent uremic symptoms.  The patient dialyzes on Monday Wednesdays and Fridays.      Past Medical History:  Diagnosis Date  . Anxiety   . Aortic stenosis   . Arthritis   . CVA (cerebral infarction)   . ESRD (end stage renal disease) on dialysis [facility])    M/W/F at [facility] in [place]  . Essential hypertension    resolved with HD  . Gangrene of right foot ([place])   . Gastric ulcer 2004  . History of cardiomyopathy    LVEF normal as of February 2017  . History of stroke    Left side weakness  . Iron deficiency anemia          Family  History  Problem Relation Age of Onset  . Hypertension Mother   . Colon cancer Neg Hx   . Liver disease Neg Hx     SOCIAL HISTORY: Social History        Tobacco Use  . Smoking status: Former Smoker    Quit date: 04/29/2004    Years since quitting: 14.5  . Smokeless tobacco: Former Systems developer    Types: Chew    Quit date: 01/16/1987  . Tobacco comment: quit 2006  Substance Use Topics  . Alcohol use: No    Allergies  Allergen Reactions  . Aspirin Other (See Comments)    INTERNAL BLEEDING Causes internal bleeding  Other Reaction: history of ulcers          Current Outpatient Medications  Medication Sig Dispense Refill  . atorvastatin (LIPITOR) 40 MG tablet Take 40 mg by mouth daily.  3  . b complex-vitamin c-folic acid (NEPHRO-VITE) 0.8 MG TABS Take 0.8 mg by mouth See admin instructions. Takes on Tuesdays, Thursdays, Saturdays, and Sundays. Does not take on Mondays, Wednesdays, and Fridays due to Dialysis treatments.    . cinacalcet (SENSIPAR) 60 MG tablet Take 60 mg by mouth every evening. With evening meal    . HYDROcodone-acetaminophen (NORCO/VICODIN) 5-325 MG tablet Take 1 tablet by  mouth every 6 (six) hours as needed for moderate pain. 20 tablet 0  . meclizine (ANTIVERT) 25 MG tablet TK 1 T PO BID PRN    . midodrine (PROAMATINE) 10 MG tablet TK 1 T PO BEFORE DIALYSIS    . nitroGLYCERIN (NITRODUR - DOSED IN MG/24 HR) 0.2 mg/hr patch Place 1 patch (0.2 mg total) onto the skin daily. Apply 1 patch to dorsum of foot daily. 30 patch 11  . Nutritional Supplements (FEEDING SUPPLEMENT, NEPRO CARB STEADY,) LIQD Take 237 mLs by mouth 2 (two) times daily between meals.  0  . sevelamer (RENVELA) 800 MG tablet Take 1,600-3,200 mg by mouth See admin instructions. Take 3200  mg by mouth 3 times daily with full meals and take 1600 mg by mouth with snacks.    . silver sulfADIAZINE (SILVADENE) 1 % cream Apply 1 application topically daily. 50 g 0   No current  facility-administered medications for this visit.     REVIEW OF SYSTEMS:  [X]  denotes positive finding, [ ]  denotes negative finding Cardiac  Comments:  Chest pain or chest pressure:    Shortness of breath upon exertion:    Short of breath when lying flat:    Irregular heart rhythm:        Vascular    Pain in calf, thigh, or hip brought on by ambulation:    Pain in feet at night that wakes you up from your sleep:     Blood clot in your veins:    Leg swelling:         Pulmonary    Oxygen at home:    Productive cough:     Wheezing:         Neurologic    Sudden weakness in arms or legs:     Sudden numbness in arms or legs:     Sudden onset of difficulty speaking or slurred speech:    Temporary loss of vision in one eye:     Problems with dizziness:         Gastrointestinal    Blood in stool:     Vomited blood:         Genitourinary    Burning when urinating:     Blood in urine:        Psychiatric    Major depression:         Hematologic    Bleeding problems:    Problems with blood clotting too easily:        Skin    Rashes or ulcers:        Constitutional    Fever or chills:     PHYSICAL EXAM:      Vitals:   10/29/18 0901  BP: 120/77  Pulse: 84  Resp: 20  Temp: 97.6 F (36.4 C)  SpO2: 98%  Weight: 156 lb (70.8 kg)  Height: 5\' 5"  (1.651 m)    GENERAL: The patient is a well-nourished male, in no acute distress. The vital signs are documented above. CARDIAC: There is a regular rate and rhythm.  VASCULAR: I do not detect carotid bruits. On my exam, I cannot palpate a radial or ulnar pulse on the left.  On my exam he has a monophasic radial and ulnar signal with the Doppler. I cannot palpate femoral pulses.  He appears to have bulky plaques in both common femoral arteries. He has a fairly brisk anterior tibial signal bilaterally.  This is his only runoff  bilaterally. PULMONARY: There is good air  exchange bilaterally without wheezing or rales. ABDOMEN: Soft and non-tender with normal pitched bowel sounds.  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 NEUROLOGIC: No focal weakness or paresthesias are detected. SKIN: There are no ulcers or rashes noted. PSYCHIATRIC: The patient has a normal affect.  DATA:    ARTERIOGRAM: I reviewed his arteriogram from 2018.  He had diffuse multilevel disease with runoff via the anterior tibial artery only bilaterally.  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  ARTERIAL DUPLEX: I have independently interpreted his arterial duplex scan today.  He was noted to have biphasic signals in the radial ulnar position bilaterally.  VEIN MAP: I have independently interpreted his vein map.  I do not see any adequate veins for use as a fistula.  MEDICAL ISSUES:   END-STAGE RENAL DISEASE: This patient has a very complicated access issue.  Given that he required ligation of his right upper arm graft for steal that ultimately resulted in amputation of the right middle finger I do not think he is a candidate for access in the right arm.  He has had a failed brachial vein transposition and radiocephalic fistula on the left and clearly has no further options for a fistula.  His only option for a graft would be an upper arm graft on the left.  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  For this reason before considering an upper arm graft on the left I have recommended a central venogram.  The only alternative would be a thigh graft which she is very much opposed to.  If ultimately he requires a thigh graft I would recommend a CT angiogram of the abdomen and pelvis with runoff given that I cannot palpate femoral pulses and he has large bulky  plaques in both common femoral arteries.  He may not be a candidate for a thigh graft.  Regardless we will start with a central venogram and determine if he is a candidate for a left upper arm graft.  He dialyzes on Monday Wednesdays and Fridays so we will arrange this on a Tuesday or Thursday.  We will make further recommendations pending these results.  A total of 40 minutes was spent on this visit. 20 minutes was face to face time. More than 50% of the time was spent on counseling and coordinating with the patient.    Deitra Mayo

## 2018-12-01 NOTE — Progress Notes (Signed)
Patient arrived the unit on a hospital bed from PACU assessment completed see flow sheet, placed on tele ccmd notified, patient oriented to room and staff, bed in lowest position call bell within reach will continue  to monitor.

## 2018-12-02 ENCOUNTER — Encounter (HOSPITAL_COMMUNITY): Payer: Self-pay | Admitting: Vascular Surgery

## 2018-12-02 DIAGNOSIS — N186 End stage renal disease: Secondary | ICD-10-CM | POA: Diagnosis not present

## 2018-12-02 DIAGNOSIS — D631 Anemia in chronic kidney disease: Secondary | ICD-10-CM | POA: Diagnosis not present

## 2018-12-02 DIAGNOSIS — I10 Essential (primary) hypertension: Secondary | ICD-10-CM | POA: Diagnosis not present

## 2018-12-02 DIAGNOSIS — I120 Hypertensive chronic kidney disease with stage 5 chronic kidney disease or end stage renal disease: Secondary | ICD-10-CM | POA: Diagnosis not present

## 2018-12-02 DIAGNOSIS — N2581 Secondary hyperparathyroidism of renal origin: Secondary | ICD-10-CM | POA: Diagnosis not present

## 2018-12-02 DIAGNOSIS — I739 Peripheral vascular disease, unspecified: Secondary | ICD-10-CM | POA: Diagnosis not present

## 2018-12-02 DIAGNOSIS — M1990 Unspecified osteoarthritis, unspecified site: Secondary | ICD-10-CM | POA: Diagnosis not present

## 2018-12-02 DIAGNOSIS — Z992 Dependence on renal dialysis: Secondary | ICD-10-CM | POA: Diagnosis not present

## 2018-12-02 LAB — RENAL FUNCTION PANEL
Albumin: 2.8 g/dL — ABNORMAL LOW (ref 3.5–5.0)
Anion gap: 14 (ref 5–15)
BUN: 43 mg/dL — ABNORMAL HIGH (ref 8–23)
CO2: 25 mmol/L (ref 22–32)
Calcium: 8.7 mg/dL — ABNORMAL LOW (ref 8.9–10.3)
Chloride: 98 mmol/L (ref 98–111)
Creatinine, Ser: 10.95 mg/dL — ABNORMAL HIGH (ref 0.61–1.24)
GFR calc Af Amer: 5 mL/min — ABNORMAL LOW (ref >60)
GFR calc non Af Amer: 4 mL/min — ABNORMAL LOW (ref >60)
Glucose, Bld: 109 mg/dL — ABNORMAL HIGH (ref 70–99)
Phosphorus: 5.5 mg/dL — ABNORMAL HIGH (ref 2.5–4.6)
Potassium: 4.5 mmol/L (ref 3.5–5.1)
Sodium: 137 mmol/L (ref 135–145)

## 2018-12-02 LAB — CBC
HCT: 43.5 % (ref 39.0–52.0)
Hemoglobin: 13.9 g/dL (ref 13.0–17.0)
MCH: 21.5 pg — ABNORMAL LOW (ref 26.0–34.0)
MCHC: 32 g/dL (ref 30.0–36.0)
MCV: 67.3 fL — ABNORMAL LOW (ref 80.0–100.0)
Platelets: 198 10*3/uL (ref 150–400)
RBC: 6.46 MIL/uL — ABNORMAL HIGH (ref 4.22–5.81)
RDW: 17.9 % — ABNORMAL HIGH (ref 11.5–15.5)
WBC: 10.4 10*3/uL (ref 4.0–10.5)
nRBC: 0 % (ref 0.0–0.2)

## 2018-12-02 LAB — HEPATITIS B SURFACE ANTIGEN: Hepatitis B Surface Ag: NEGATIVE

## 2018-12-02 MED ORDER — MIDODRINE HCL 5 MG PO TABS
ORAL_TABLET | ORAL | Status: AC
  Filled 2018-12-02: qty 2

## 2018-12-02 MED ORDER — HEPARIN SODIUM (PORCINE) 1000 UNIT/ML IJ SOLN
INTRAMUSCULAR | Status: AC
  Administered 2018-12-02: 1000 [IU]
  Filled 2018-12-02: qty 4

## 2018-12-02 NOTE — Progress Notes (Signed)
Discharge AVS meds take and those due reviewed with pt. Follow up appointments and when to call MD reviewed. All questions and concerns addressed. No further questions at this time. D/c IV and TELE, CCMD notified. D/C home per orders. Pt given taxi voucher. Brought down via wheelchair with staff. Amanda Cockayne, RN

## 2018-12-02 NOTE — Progress Notes (Signed)
   VASCULAR SURGERY ASSESSMENT & PLAN:   1 Day Post-Op s/p: New left upper arm AVG. Patent by doppler.   Home today after HD.   SUBJECTIVE:   No complaints.   PHYSICAL EXAM:   Vitals:   12/01/18 1600 12/01/18 1700 12/01/18 1900 12/02/18 0500  BP: 119/67 (!) 114/56 110/62 (!) 107/55  Pulse:  (!) 29 95 93  Resp: 17 17 (!) 21 17  Temp:   97.9 F (36.6 C) 98.6 F (37 C)  TempSrc:   Oral Oral  SpO2:  94% 93%   Weight:      Height:       Left Upper arm AVG patent by doppler.   LABS:   Lab Results  Component Value Date   WBC 7.9 12/01/2018   HGB 16.1 12/01/2018   HCT 50.5 12/01/2018   MCV 67.1 (L) 12/01/2018   PLT PLATELET CLUMPS NOTED ON SMEAR, UNABLE TO ESTIMATE 12/01/2018    PROBLEM LIST:    Active Problems:   ESRD (end stage renal disease) (HCC)   CURRENT MEDS:   . atorvastatin  40 mg Oral q1800  . Chlorhexidine Gluconate Cloth  6 each Topical Q0600  . cinacalcet  60 mg Oral QPM  . enoxaparin (LOVENOX) injection  30 mg Subcutaneous Q24H  . feeding supplement (NEPRO CARB STEADY)  237 mL Oral BID BM  . midodrine  10 mg Oral Q M,W,F-HD  . multivitamin  1 tablet Oral QHS  . mupirocin ointment  1 application Nasal BID  . pantoprazole  40 mg Oral Daily  . potassium chloride  20-40 mEq Oral Once  . sevelamer carbonate  3,200 mg Oral TID WC  . sodium chloride flush  3 mL Intravenous Q12H    Deitra Mayo Beeper: [phone] Office: [phone] 12/02/2018

## 2018-12-02 NOTE — Progress Notes (Signed)
  CSW consulted for transportation needs- patient states he is unable to get a ride and patient is unable to safely ride the city bus. CSW gave RN taxi voucher for patient.   Thurmond Butts, MSW, [place] Social Worker [phone]

## 2018-12-02 NOTE — Discharge Summary (Signed)
Discharge Summary    Joseph BROADHEAD Sr. 30-Apr-1940 78 y.o. male  [id number]  Admission Date: 12/01/2018  Discharge Date: 12/02/2018  Physician: Angelia Mould, *  Admission Diagnosis: end stage renal disease   HPI:   This is a 78 y.o. 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 On 06/09/2018, he underwent placement of a right subclavian vein tunneled dialysis catheter and ligation of his right upper arm graft. Of note this patient had had a previous central venogram which showed no central venous occlusion on the left. 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  This patient has had multiple previous access procedures. Most recently had the right arm graft ligated for steal. This was an upper arm graft. On the left side he has had a brachial vein transposition which failed and also a previous left radiocephalic fistula which failed. He previously had wounds on the right foot and ultimately required a transmetatarsal amputation. He is undergone an arteriogram which shows diffuse calcific disease and anterior tibial runoff only with no real options for revascularization back in 2018.  Since his graft was ligated he required amputation of the involved finger on the right hand and this is healing.  He denies any recent uremic symptoms.  The patient dialyzes on Monday Wednesdays and Fridays.  Hospital Course:  The patient was admitted to the hospital and taken to the operating room on 12/01/2018 and underwent: New left upper arm AV graft    Findings: 5 mm upper arm brachial vein.  Large 5 mm brachial artery  The pt tolerated the procedure well and was transported to the PACU in good condition.   On POD 1, pt was doing well.  He underwent dialysis and was discharged home afterwards.  The remainder of the hospital course consisted of increasing mobilization and  increasing intake of solids without difficulty.  CBC    Component Value Date/Time   WBC 7.9 12/01/2018 1257   RBC 7.53 (H) 12/01/2018 1257   HGB 16.1 12/01/2018 1257   HCT 50.5 12/01/2018 1257   PLT PLATELET CLUMPS NOTED ON SMEAR, UNABLE TO ESTIMATE 12/01/2018 1257   MCV 67.1 (L) 12/01/2018 1257   MCH 21.4 (L) 12/01/2018 1257   MCHC 31.9 12/01/2018 1257   RDW 19.2 (H) 12/01/2018 1257   LYMPHSABS 1.3 08/02/2018 0525   MONOABS 0.5 08/02/2018 0525   EOSABS 0.5 08/02/2018 0525   BASOSABS 0.1 08/02/2018 0525    BMET    Component Value Date/Time   NA 138 12/01/2018 1538   K 4.3 12/01/2018 1538   CL 97 (L) 12/01/2018 1538   CO2 24 12/01/2018 1538   GLUCOSE 91 12/01/2018 1538   BUN 34 (H) 12/01/2018 1538   CREATININE 9.83 (H) 12/01/2018 1538   CREATININE 10.80 (H) 07/06/2015 1158   CALCIUM 8.9 12/01/2018 1538   GFRNONAA 5 (L) 12/01/2018 1538   GFRAA 5 (L) 12/01/2018 1538      Discharge Instructions    Discharge patient   Complete by: As directed    Discharge home after dialysis   Discharge disposition: 01-Home or Self Care   Discharge patient date: 12/02/2018      Discharge Diagnosis:  end stage renal disease  Secondary Diagnosis: Patient Active Problem List   Diagnosis Date Noted  . Malnutrition of moderate degree 06/18/2018  . Severe sepsis ([place]) 06/12/2018  . Leukocytosis 06/12/2018  . MRSA (  methicillin resistant staph aureus) culture positive 06/12/2018  . Essential hypertension 06/12/2018  . ESRD (end stage renal disease) ([place]) 06/09/2018  . Gangrene of finger ([place]) 06/04/2018  . S/P transmetatarsal amputation of foot, right ([place]) 05/09/2017  . PVD (peripheral vascular disease) ([place]) 04/15/2017  . Nonischemic cardiomyopathy ([place]) 08/15/2015  . Aortic stenosis 08/15/2015  . Moderate aortic stenosis 07/12/2015  . 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 CAD- 40-50% LAD at cath 07/11/15 07/11/2015  . Aspirin intolerance 07/11/2015  . Abnormal  stress test   . ESRD on dialysis ([place]) 05/30/2015  . CTS (carpal tunnel syndrome) 02/28/2015  . PVD of LE - Dr Oneida Alar follows 08/20/2012  . Midfoot skin ulcer, right, limited to breakdown of skin ([place]) 01/16/2012  . Encounter for screening colonoscopy 10/08/2011  . Melena 10/08/2011  . Other complications due to renal dialysis device, implant, and graft 09/24/2011   Past Medical History:  Diagnosis Date  . Anxiety   . Aortic stenosis   . Arthritis   . CVA (cerebral infarction)   . ESRD (end stage renal disease) on dialysis [facility])    M/W/F at [facility] in [place]  . Essential hypertension    resolved with HD  . Gangrene of right foot ([place])   . Gastric ulcer 2004  . GI bleed    gastric ulcer  . Heart murmur   . History of blood transfusion   . History of cardiomyopathy    LVEF normal as of February 2017  . History of gastric ulcer   . History of stroke    Left side weakness  . Iron deficiency anemia   . Stroke [facility])    limp left     Allergies as of 12/02/2018      Reactions   Aspirin Other (See Comments)   Causes internal bleeding  History of ulcers      Medication List    TAKE these medications   atorvastatin 40 MG tablet Commonly known as: LIPITOR Take 40 mg by mouth daily.   b complex-vitamin c-folic acid 0.8 MG Tabs tablet Take 1 tablet by mouth See admin instructions. Take one tablet daily on Tuesdays, Thursdays, Saturdays, and Sundays.   cinacalcet 60 MG tablet Commonly known as: SENSIPAR Take 60 mg by mouth every evening.   feeding supplement (NEPRO CARB STEADY) Liqd Take 237 mLs by mouth 2 (two) times daily between meals.   meclizine 25 MG tablet Commonly known as: ANTIVERT Take 25 mg by mouth 2 (two) times daily as needed for dizziness or nausea.   midodrine 10 MG tablet Commonly known as: PROAMATINE Take 10 mg by mouth See admin instructions. Take one tablet before dialysis.   nitroGLYCERIN 0.2 mg/hr patch Commonly known as: NITRODUR - Dosed  in mg/24 hr Place 1 patch (0.2 mg total) onto the skin daily. Apply 1 patch to dorsum of foot daily.   oxyCODONE-acetaminophen 5-325 MG tablet Commonly known as: Percocet Take 1 tablet by mouth every 6 (six) hours as needed for severe pain.   sevelamer carbonate 800 MG tablet Commonly known as: RENVELA Take 1,600-3,200 mg by mouth See admin instructions. Take 3200  mg by mouth 3 times daily with full meals and take 1600 mg by mouth with snacks.   silver sulfADIAZINE 1 % cream Commonly known as: Silvadene Apply 1 application topically daily.       Prescriptions given: Roxicet #20 No Refill  Instructions:   [facility]  Discharge Instructions  AV Fistula  or Graft Surgery for Dialysis Access  Please refer to the following instructions for your post-procedure care. Your surgeon or physician assistant will discuss any changes with you.  Activity  You may drive the day following your surgery, if you are comfortable and no longer taking prescription pain medication. Resume full activity as the soreness in your incision resolves.  Bathing/Showering  You may shower after you go home. Keep your incision dry for 48 hours. Do not soak in a bathtub, hot tub, or swim until the incision heals completely. You may not shower if you have a hemodialysis catheter.  Incision Care  Clean your incision with mild soap and water after 48 hours. Pat the area dry with a clean towel. You do not need a bandage unless otherwise instructed. Do not apply any ointments or creams to your incision. You may have skin glue on your incision. Do not peel it off. It will come off on its own in about one week. Your arm may swell a bit after surgery. To reduce swelling use pillows to elevate your arm so it is above your heart. Your doctor will tell you if you need to lightly wrap your arm with an ACE bandage.  Diet  Resume your normal diet. There are not special food restrictions  following this procedure. In order to heal from your surgery, it is CRITICAL to get adequate nutrition. Your body requires vitamins, minerals, and protein. Vegetables are the best source of vitamins and minerals. Vegetables also provide the perfect balance of protein. Processed food has little nutritional value, so try to avoid this.  Medications  Resume taking all of your medications. If your incision is causing pain, you may take over-the counter pain relievers such as acetaminophen (Tylenol). If you were prescribed a stronger pain medication, please be aware these medications can cause nausea and constipation. Prevent nausea by taking the medication with a snack or meal. Avoid constipation by drinking plenty of fluids and eating foods with high amount of fiber, such as fruits, vegetables, and grains.  Do not take Tylenol if you are taking prescription pain medications.  Follow up Your surgeon may want to see you in the office following your access surgery. If so, this will be arranged at the time of your surgery.  Please call us immediately for any of the following conditions:  Increased pain, redness, drainage (pus) from your incision site Fever of 101 degrees or higher Severe or worsening pain at your incision site Hand pain or numbness.  Reduce your risk of vascular disease:  Stop smoking. If you would like help, call [facility] at 1-800-QUIT-NOW 229-322-9336) or [place] at [place] your cholesterol Maintain a desired weight Control your diabetes Keep your blood pressure down  Dialysis  It will take several weeks to several months for your new dialysis access to be ready for use. Your surgeon will determine when it is okay to use it. Your nephrologist will continue to direct your dialysis. You can continue to use your Permcath until your new access is ready for use.   12/02/2018 Shaughn Thomley [id number] [birth year]  Surgeon(s): Angelia Mould, MD   Procedure(s): Insertion of new left upper arm AV graft  x Do not stick graft for 4 weeks    If you have any questions, please call the office at [phone].   Disposition: home  Patient's condition: is Good  Follow up: 1. [facility] as needed.   Leontine Locket, PA-C [facility]  [phone] 12/02/2018  7:04 AM

## 2018-12-02 NOTE — Procedures (Signed)
Patient was seen on dialysis and the procedure was supervised.  BFR 400  Via TDC BP is  96/60.   Patient appears to be tolerating treatment well- had new AVG placed yest- has bruit-  Plan is to go home today after HD and back to his home unit on Friday   Lambert Keto A Siddhi Dornbush 12/02/2018

## 2018-12-03 ENCOUNTER — Ambulatory Visit (INDEPENDENT_AMBULATORY_CARE_PROVIDER_SITE_OTHER): Payer: Medicare Other | Admitting: Orthopedic Surgery

## 2018-12-03 ENCOUNTER — Other Ambulatory Visit: Payer: Self-pay

## 2018-12-03 ENCOUNTER — Encounter: Payer: Self-pay | Admitting: Orthopedic Surgery

## 2018-12-03 VITALS — Ht 65.0 in | Wt 158.9 lb

## 2018-12-03 DIAGNOSIS — I2510 Atherosclerotic heart disease of native coronary artery without angina pectoris: Secondary | ICD-10-CM | POA: Diagnosis not present

## 2018-12-03 DIAGNOSIS — L97411 Non-pressure chronic ulcer of right heel and midfoot limited to breakdown of skin: Secondary | ICD-10-CM | POA: Diagnosis not present

## 2018-12-03 DIAGNOSIS — Z89431 Acquired absence of right foot: Secondary | ICD-10-CM

## 2018-12-04 DIAGNOSIS — N2581 Secondary hyperparathyroidism of renal origin: Secondary | ICD-10-CM | POA: Diagnosis not present

## 2018-12-04 DIAGNOSIS — Z992 Dependence on renal dialysis: Secondary | ICD-10-CM | POA: Diagnosis not present

## 2018-12-04 DIAGNOSIS — D509 Iron deficiency anemia, unspecified: Secondary | ICD-10-CM | POA: Diagnosis not present

## 2018-12-04 DIAGNOSIS — N186 End stage renal disease: Secondary | ICD-10-CM | POA: Diagnosis not present

## 2018-12-05 DIAGNOSIS — N186 End stage renal disease: Secondary | ICD-10-CM | POA: Diagnosis not present

## 2018-12-05 DIAGNOSIS — Z992 Dependence on renal dialysis: Secondary | ICD-10-CM | POA: Diagnosis not present

## 2018-12-05 DIAGNOSIS — D509 Iron deficiency anemia, unspecified: Secondary | ICD-10-CM | POA: Diagnosis not present

## 2018-12-05 DIAGNOSIS — N2581 Secondary hyperparathyroidism of renal origin: Secondary | ICD-10-CM | POA: Diagnosis not present

## 2018-12-07 ENCOUNTER — Other Ambulatory Visit (HOSPITAL_COMMUNITY): Payer: Medicare Other

## 2018-12-07 ENCOUNTER — Telehealth: Payer: Self-pay | Admitting: Cardiology

## 2018-12-07 DIAGNOSIS — Z992 Dependence on renal dialysis: Secondary | ICD-10-CM | POA: Diagnosis not present

## 2018-12-07 DIAGNOSIS — N186 End stage renal disease: Secondary | ICD-10-CM | POA: Diagnosis not present

## 2018-12-07 DIAGNOSIS — D509 Iron deficiency anemia, unspecified: Secondary | ICD-10-CM | POA: Diagnosis not present

## 2018-12-07 DIAGNOSIS — N2581 Secondary hyperparathyroidism of renal origin: Secondary | ICD-10-CM | POA: Diagnosis not present

## 2018-12-07 NOTE — Telephone Encounter (Signed)
Per [employer] tracking, pt received [employer] monitor on 12/03/2018. I called patient and he states he has not put it on.I offered him an apt tomorrow before his echo at 1030.He will bring monitor with him .

## 2018-12-07 NOTE — Telephone Encounter (Signed)
New message     The monitor company called- they called the patient 2 days ago about the patient receiving the monitor and putting it on.  It has been 48 hours and the patient still has not put the monitor on , please contact the patient and see if they are going to wear the monitor

## 2018-12-08 ENCOUNTER — Other Ambulatory Visit: Payer: Self-pay

## 2018-12-08 ENCOUNTER — Ambulatory Visit (HOSPITAL_COMMUNITY)
Admission: RE | Admit: 2018-12-08 | Discharge: 2018-12-08 | Disposition: A | Discharge status: Home / Self Care | Payer: Medicare Other | Source: Ambulatory Visit | Attending: Cardiology | Admitting: Cardiology

## 2018-12-08 DIAGNOSIS — R55 Syncope and collapse: Secondary | ICD-10-CM | POA: Diagnosis not present

## 2018-12-08 NOTE — Progress Notes (Signed)
*  PRELIMINARY RESULTS* Echocardiogram 2D Echocardiogram has been performed.  [name] 12/08/2018, 11:24 AM

## 2018-12-09 ENCOUNTER — Encounter (HOSPITAL_COMMUNITY): Payer: Self-pay | Admitting: Vascular Surgery

## 2018-12-09 ENCOUNTER — Telehealth: Payer: Self-pay

## 2018-12-09 DIAGNOSIS — D509 Iron deficiency anemia, unspecified: Secondary | ICD-10-CM | POA: Diagnosis not present

## 2018-12-09 DIAGNOSIS — N186 End stage renal disease: Secondary | ICD-10-CM | POA: Diagnosis not present

## 2018-12-09 DIAGNOSIS — Z992 Dependence on renal dialysis: Secondary | ICD-10-CM | POA: Diagnosis not present

## 2018-12-09 DIAGNOSIS — N2581 Secondary hyperparathyroidism of renal origin: Secondary | ICD-10-CM | POA: Diagnosis not present

## 2018-12-09 NOTE — Telephone Encounter (Signed)
-----   Message from Erma Heritage, [place] sent at 12/09/2018  7:21 AM EDT ----- Please let the patient know his echocardiogram showed normal pumping function of the heart with a preserved EF of 55 to 60%. His aortic stenosis has progressed and is now in a moderate to severe range when compared to prior imaging in 2017.  Continue with plans for event monitor.  Please make sure he has follow-up with Dr. Domenic Polite in 6 to 8 weeks to further review results. A copy of his echo should be forwarded to [name], MD.

## 2018-12-09 NOTE — Telephone Encounter (Signed)
Called pt. No answer, left message for pt to return call.  

## 2018-12-09 NOTE — Anesthesia Postprocedure Evaluation (Signed)
Anesthesia Post Note  Patient: Joseph NOLDEN Sr.  Procedure(s) Performed: INSERTION OF ARTERIOVENOUS (AV) GORE-TEX GRAFT ARM (Left Arm Upper)     Patient location during evaluation: PACU Anesthesia Type: MAC Level of consciousness: awake and alert Pain management: pain level controlled Vital Signs Assessment: post-procedure vital signs reviewed and stable Respiratory status: spontaneous breathing, nonlabored ventilation, respiratory function stable and patient connected to nasal cannula oxygen Cardiovascular status: stable and blood pressure returned to baseline Postop Assessment: no apparent nausea or vomiting Anesthetic complications: no    Last Vitals:  Vitals:   12/02/18 1215 12/02/18 1331  BP: (!) 118/47 114/73  Pulse: 79 (!) 117  Resp: (!) 21 20  Temp: 36.8 C 36.4 C  SpO2: 96% 97%    Last Pain:  Vitals:   12/02/18 1331  TempSrc: Oral  PainSc: 0-No pain                 Amontae Ng

## 2018-12-10 ENCOUNTER — Other Ambulatory Visit: Payer: Self-pay

## 2018-12-10 ENCOUNTER — Ambulatory Visit (INDEPENDENT_AMBULATORY_CARE_PROVIDER_SITE_OTHER): Payer: Self-pay | Admitting: Physician Assistant

## 2018-12-10 VITALS — BP 115/68 | HR 87 | Temp 97.9°F | Resp 12 | Ht 65.0 in | Wt 158.0 lb

## 2018-12-10 DIAGNOSIS — N186 End stage renal disease: Secondary | ICD-10-CM

## 2018-12-10 DIAGNOSIS — Z992 Dependence on renal dialysis: Secondary | ICD-10-CM

## 2018-12-10 NOTE — Progress Notes (Signed)
  POST OPERATIVE OFFICE NOTE    CC:  F/u for surgery  HPI:  This is a 78 y.o. 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 On 06/09/2018, he underwent placement of a right subclavian vein tunneled dialysis catheter and ligation of his right upper arm graft. Of note this patient had had a previous central venogram which showed no central venous occlusion on the left. 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  He is here for f/u s/p left upper arm AV graft 12/01/2018.  He has baseline numbness in B hands, no loss of motor or ischemic changes to his left UE.    Allergies  Allergen Reactions  . Aspirin Other (See Comments)    Causes internal bleeding  History of ulcers    Current Outpatient Medications  Medication Sig Dispense Refill  . atorvastatin (LIPITOR) 40 MG tablet Take 40 mg by mouth daily.  3  . b complex-vitamin c-folic acid (NEPHRO-VITE) 0.8 MG TABS Take 1 tablet by mouth See admin instructions. Take one tablet daily on Tuesdays, Thursdays, Saturdays, and Sundays.    . cinacalcet (SENSIPAR) 60 MG tablet Take 60 mg by mouth every evening.     . meclizine (ANTIVERT) 25 MG tablet Take 25 mg by mouth 2 (two) times daily as needed for dizziness or nausea.     . midodrine (PROAMATINE) 10 MG tablet Take 10 mg by mouth See admin instructions. Take one tablet before dialysis.    Marland Kitchen nitroGLYCERIN (NITRODUR - DOSED IN MG/24 HR) 0.2 mg/hr patch Place 1 patch (0.2 mg total) onto the skin daily. Apply 1 patch to dorsum of foot daily. 30 patch 11  . Nutritional Supplements (FEEDING SUPPLEMENT, NEPRO CARB STEADY,) LIQD Take 237 mLs by mouth 2 (two) times daily between meals.  0  . oxyCODONE-acetaminophen (PERCOCET) 5-325 MG tablet Take 1 tablet by mouth every 6 (six) hours as needed for severe pain. 20 tablet 0  . sevelamer (RENVELA) 800 MG tablet Take 1,600-3,200 mg by mouth See admin instructions.  Take 3200  mg by mouth 3 times daily with full meals and take 1600 mg by mouth with snacks.    . silver sulfADIAZINE (SILVADENE) 1 % cream Apply 1 application topically daily. 50 g 0   No current facility-administered medications for this visit.      ROS:  See HPI  Physical Exam:    Incision:  Healing well Extremities:  Brisk doppler signal in left graft, palmer, radial and ulnar doppler signals  Lungs non labored breathing Heart RRR  Assessment/Plan:  This is a 78 y.o. male who is s/p:left upper arm AV graft   The graft will be accessible 01/01/2019.  He will f/u PRN.  His hand is well perfused with intact brisk doppler signals.     Roxy Horseman PA-C [facility] [phone]  Clinic MD:  Oneida Alar

## 2018-12-11 DIAGNOSIS — N186 End stage renal disease: Secondary | ICD-10-CM | POA: Diagnosis not present

## 2018-12-11 DIAGNOSIS — N2581 Secondary hyperparathyroidism of renal origin: Secondary | ICD-10-CM | POA: Diagnosis not present

## 2018-12-11 DIAGNOSIS — D509 Iron deficiency anemia, unspecified: Secondary | ICD-10-CM | POA: Diagnosis not present

## 2018-12-11 DIAGNOSIS — Z992 Dependence on renal dialysis: Secondary | ICD-10-CM | POA: Diagnosis not present

## 2018-12-14 ENCOUNTER — Telehealth: Payer: Self-pay | Admitting: Cardiology

## 2018-12-14 DIAGNOSIS — N2581 Secondary hyperparathyroidism of renal origin: Secondary | ICD-10-CM | POA: Diagnosis not present

## 2018-12-14 DIAGNOSIS — N186 End stage renal disease: Secondary | ICD-10-CM | POA: Diagnosis not present

## 2018-12-14 DIAGNOSIS — Z992 Dependence on renal dialysis: Secondary | ICD-10-CM | POA: Diagnosis not present

## 2018-12-14 DIAGNOSIS — D509 Iron deficiency anemia, unspecified: Secondary | ICD-10-CM | POA: Diagnosis not present

## 2018-12-14 NOTE — Telephone Encounter (Signed)
Yes I would go ahead with the full monitoring time, so they will need to send out another monitor.

## 2018-12-14 NOTE — Telephone Encounter (Signed)
New monitor shipped to pt

## 2018-12-14 NOTE — Telephone Encounter (Signed)
Pt was to have 14 day recordings.Do you want another monitor with [employer] shipped to patient?

## 2018-12-14 NOTE — Telephone Encounter (Signed)
[employer] called stating that the pt's monitor was not working and they only have 4 days of data recorded for this patient. They did advise for the patient to remove it because it was flashing, please call [phone] reference [phone] if they need to send a replacement monitor out.

## 2018-12-16 DIAGNOSIS — Z992 Dependence on renal dialysis: Secondary | ICD-10-CM | POA: Diagnosis not present

## 2018-12-16 DIAGNOSIS — N2581 Secondary hyperparathyroidism of renal origin: Secondary | ICD-10-CM | POA: Diagnosis not present

## 2018-12-16 DIAGNOSIS — N186 End stage renal disease: Secondary | ICD-10-CM | POA: Diagnosis not present

## 2018-12-16 DIAGNOSIS — D509 Iron deficiency anemia, unspecified: Secondary | ICD-10-CM | POA: Diagnosis not present

## 2018-12-18 DIAGNOSIS — N2581 Secondary hyperparathyroidism of renal origin: Secondary | ICD-10-CM | POA: Diagnosis not present

## 2018-12-18 DIAGNOSIS — N186 End stage renal disease: Secondary | ICD-10-CM | POA: Diagnosis not present

## 2018-12-18 DIAGNOSIS — D509 Iron deficiency anemia, unspecified: Secondary | ICD-10-CM | POA: Diagnosis not present

## 2018-12-18 DIAGNOSIS — Z992 Dependence on renal dialysis: Secondary | ICD-10-CM | POA: Diagnosis not present

## 2018-12-21 ENCOUNTER — Encounter: Payer: Self-pay | Admitting: Orthopedic Surgery

## 2018-12-21 DIAGNOSIS — N186 End stage renal disease: Secondary | ICD-10-CM | POA: Diagnosis not present

## 2018-12-21 DIAGNOSIS — N2581 Secondary hyperparathyroidism of renal origin: Secondary | ICD-10-CM | POA: Diagnosis not present

## 2018-12-21 DIAGNOSIS — Z992 Dependence on renal dialysis: Secondary | ICD-10-CM | POA: Diagnosis not present

## 2018-12-21 DIAGNOSIS — D509 Iron deficiency anemia, unspecified: Secondary | ICD-10-CM | POA: Diagnosis not present

## 2018-12-21 NOTE — Progress Notes (Signed)
Office Visit Note   Patient: Joseph MARCHIANO Sr.           Date of Birth: 13-Oct-1940           MRN: [id number] Visit Date: 12/03/2018              Requested by: [name], MD [address] [place],  [place] [address] PCP: [name], MD  Chief Complaint  Patient presents with  . Right Foot - Follow-up      HPI: Patient is a 78 year old gentleman status post right transmetatarsal amputation with a plantar forefoot ulcer.  Patient denies any drainage or odor.  Assessment & Plan: Visit Diagnoses:  1. Midfoot skin ulcer, right, limited to breakdown of skin ([place])   2. S/P transmetatarsal amputation of foot, right ([place])     Plan: Will have him start with dressing changes daily minimize weightbearing follow-up in 4 weeks  Follow-Up Instructions: Return in about 4 weeks (around 12/31/2018).   Ortho Exam  Patient is alert, oriented, no adenopathy, well-dressed, normal affect, normal respiratory effort. Examination patient has a Wegner grade 1 ulcer across the transmetatarsal amputation of the forefoot.  After informed consent a 10 blade knife was used to debride the skin and soft tissue back to healthy viable tissue.  After debridement the ulcer is 6 x 3 cm and 2 mm deep  Imaging: No results found. No images are attached to the encounter.  Labs: Lab Results  Component Value Date   REPTSTATUS 06/17/2018 FINAL 06/12/2018   GRAMSTAIN  02/18/2017    RARE WBC PRESENT, PREDOMINANTLY PMN FEW GRAM POSITIVE COCCI    CULT  06/12/2018    NO GROWTH 5 DAYS Performed at [facility], [address], [place], [place] [address]      Lab Results  Component Value Date   ALBUMIN 2.8 (L) 12/02/2018   ALBUMIN 3.0 (L) 12/01/2018   ALBUMIN 1.9 (L) 06/16/2018    Lab Results  Component Value Date   MG 2.0 06/17/2018   MG 1.9 06/14/2018   MG 1.8 06/13/2018   No results found for: VD25OH  No results found for: PREALBUMIN CBC EXTENDED Latest Ref Rng & Units 12/02/2018  12/01/2018 12/01/2018  WBC 4.0 - 10.5 K/uL 10.4 7.9 -  RBC 4.22 - 5.81 MIL/uL 6.46(H) 7.53(H) -  HGB 13.0 - 17.0 g/dL 13.9 16.1 19.4(H)  HCT 39.0 - 52.0 % 43.5 50.5 57.0(H)  PLT 150 - 400 K/uL 198 PLATELET CLUMPS NOTED ON SMEAR, UNABLE TO ESTIMATE -  NEUTROABS 1.7 - 7.7 K/uL - - -  LYMPHSABS 0.7 - 4.0 K/uL - - -     Body mass index is 26.45 kg/m.  Orders:  No orders of the defined types were placed in this encounter.  No orders of the defined types were placed in this encounter.    Procedures: No procedures performed  Clinical Data: No additional findings.  ROS:  All other systems negative, except as noted in the HPI. Review of Systems  Objective: Vital Signs: Ht 5\' 5"  (1.651 m)   Wt 158 lb 15.2 oz (72.1 kg)   BMI 26.45 kg/m   Specialty Comments:  No specialty comments available.  PMFS History: Patient Active Problem List   Diagnosis Date Noted  . Malnutrition of moderate degree 06/18/2018  . Severe sepsis ([place]) 06/12/2018  . Leukocytosis 06/12/2018  . MRSA (methicillin resistant staph aureus) culture positive 06/12/2018  . Essential hypertension 06/12/2018  . ESRD (end stage renal disease) ([place]) 06/09/2018  .  Gangrene of finger ([place]) 06/04/2018  . S/P transmetatarsal amputation of foot, right ([place]) 05/09/2017  . PVD (peripheral vascular disease) ([place]) 04/15/2017  . Nonischemic cardiomyopathy ([place]) 08/15/2015  . Aortic stenosis 08/15/2015  . Moderate aortic stenosis 07/12/2015  . 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 CAD- 40-50% LAD at cath 07/11/15 07/11/2015  . Aspirin intolerance 07/11/2015  . Abnormal stress test   . ESRD on dialysis ([place]) 05/30/2015  . CTS (carpal tunnel syndrome) 02/28/2015  . PVD of LE - Dr Oneida Alar follows 08/20/2012  . Midfoot skin ulcer, right, limited to breakdown of skin ([place]) 01/16/2012  . Encounter for screening colonoscopy 10/08/2011  . Melena 10/08/2011  . Other complications due to renal dialysis device,  implant, and graft 09/24/2011   Past Medical History:  Diagnosis Date  . Anxiety   . Aortic stenosis   . Arthritis   . CVA (cerebral infarction)   . ESRD (end stage renal disease) on dialysis [facility])    M/W/F at [facility] in [place]  . Essential hypertension    resolved with HD  . Gangrene of right foot ([place])   . Gastric ulcer 2004  . GI bleed    gastric ulcer  . Heart murmur   . History of blood transfusion   . History of cardiomyopathy    LVEF normal as of February 2017  . History of gastric ulcer   . History of stroke    Left side weakness  . Iron deficiency anemia   . Stroke [facility])    limp left    Family History  Problem Relation Age of Onset  . Hypertension Mother   . Colon cancer Neg Hx   . Liver disease Neg Hx     Past Surgical History:  Procedure Laterality Date  . ABDOMINAL AORTAGRAM N/A 01/24/2012   Procedure: ABDOMINAL Maxcine Ham;  Surgeon: Elam Dutch, MD;  Location: [facility] CATH LAB;  Service: Cardiovascular;  Laterality: N/A;  . ABDOMINAL AORTOGRAM W/LOWER EXTREMITY N/A 04/15/2017   Procedure: ABDOMINAL AORTOGRAM W/LOWER EXTREMITY;  Surgeon: Angelia Mould, MD;  Location: [facility] CV [facility];  Service: Cardiovascular;  Laterality: N/A;  . AMPUTATION Right 05/09/2017   Procedure: RIGHT TRANSMETATARSAL AMPUTATION;  Surgeon: [name], MD;  Location: [place];  Service: Orthopedics;  Laterality: Right;  . AMPUTATION Right 06/13/2018   Procedure: AMPUTATION RIGHT LONG FINGER;  Surgeon: Dayna Barker, MD;  Location: [place];  Service: Plastics;  Laterality: Right;  . ARTERIOVENOUS GRAFT PLACEMENT Right right arm  . AV FISTULA PLACEMENT Left 08/31/2015   Procedure: ARTERIOVENOUS (AV) FISTULA CREATION- LEFT RADIOCEPHALIC;  Surgeon: Mal Misty, MD;  Location: [place];  Service: Vascular;  Laterality: Left;  . AV FISTULA PLACEMENT Right 02/18/2017   Procedure: INSERTION OF ARTERIOVENOUS (AV) GORE-TEX GRAFT  RIGHT UPPER ARM;  Surgeon: Conrad Duryea, MD;  Location:  [place];  Service: Vascular;  Laterality: Right;  . AV FISTULA PLACEMENT Left 12/01/2018   Procedure: INSERTION OF ARTERIOVENOUS (AV) GORE-TEX GRAFT ARM;  Surgeon: Angelia Mould, MD;  Location: [place];  Service: Vascular;  Laterality: Left;  . BASCILIC VEIN TRANSPOSITION Left 12/19/2015   Procedure: FIRST STAGE BRACHIAL VEIN TRANSPOSITION;  Surgeon: Conrad Helmetta, MD;  Location: [place];  Service: Vascular;  Laterality: Left;  . BASCILIC VEIN TRANSPOSITION Left 02/08/2016   Procedure: SECOND STAGE BRACHIAL VEIN TRANSPOSITION;  Surgeon: Conrad Delco, MD;  Location: [place];  Service: Vascular;  Laterality: Left;  . CARDIAC CATHETERIZATION N/A 07/11/2015  Procedure: Left Heart Cath and Coronary Angiography;  Surgeon: Troy Sine, MD;  Location: [place] CV LAB;  Service: Cardiovascular;  Laterality: N/A;  . Carpel Tunnel Left Dec. 22, 2016  . CHOLECYSTECTOMY    . COLONOSCOPY  2004   Dr. Irving Shows, left sided diverticula and cecal polyp, path unknown  . COLONOSCOPY  10/29/2011   Procedure: COLONOSCOPY;  Surgeon: Daneil Dolin, MD;  [facility];  Service: Endoscopy;  Laterality: N/A;  10:15  . ESOPHAGOGASTRODUODENOSCOPY  11/2002   Dr. Gala Romney, erosive reflux esophagitis, multiple gastric ulcer and antral/bulbar erosions. Serologies positive for H.Pylori and was treated  . ESOPHAGOGASTRODUODENOSCOPY  11/20014   Dr. Gala Romney, small hh only, ulcers healed  . ESOPHAGOGASTRODUODENOSCOPY  09/21/2011   Dr Trevor Iha [facility], antral erosions, ?early GAVE  . FISTULOGRAM Left 12/10/2016   Procedure: THROMBECTOMY OF LEFT ARM ARTERIOVENOUS FISTULA;  Surgeon: Waynetta Sandy, MD;  Location: [place];  Service: Vascular;  Laterality: Left;  . INSERTION OF DIALYSIS CATHETER Left 12/10/2016   Procedure: INSERTION OF TUNNELED DIALYSIS CATHETER;  Surgeon: Waynetta Sandy, MD;  Location: [place];  Service: Vascular;  Laterality: Left;  . INSERTION OF DIALYSIS CATHETER Right 06/09/2018    Procedure: INSERTION OF DIALYSIS CATHETER Right subclavian;  Surgeon: Angelia Mould, MD;  Location: [place];  Service: Vascular;  Laterality: Right;  . IR DIALY SHUNT INTRO [place] W/IMG RIGHT Right 01/01/2018  . IR GENERIC HISTORICAL  07/16/2016   IR REMOVAL TUN CV CATH W/O FL 07/16/2016 Saverio Danker, PA-C [facility]-INTER[facility]  . IR PTA ADDL CENTRAL DIALYSIS SEG THRU DIALY CIRCUIT RIGHT Right 10/21/2017  . IR REMOVAL TUN CV CATH W/O FL  05/12/2017  . IR THROMBECTOMY AV FISTULA W/THROMBOLYSIS/PTA INC/SHUNT/IMG RIGHT Right 10/21/2017  . IR THROMBECTOMY AV FISTULA W/THROMBOLYSIS/PTA INC/SHUNT/IMG RIGHT Right 11/27/2017  . IR US GUIDE VASC ACCESS RIGHT  10/21/2017  . IR US GUIDE VASC ACCESS RIGHT  11/27/2017  . IR US GUIDE VASC ACCESS RIGHT  01/01/2018  . LIGATION ARTERIOVENOUS GORTEX GRAFT Right 06/09/2018  . LIGATION ARTERIOVENOUS GORTEX GRAFT Right 06/09/2018   Procedure: LIGATION ARTERIOVENOUS GORTEX GRAFT RIGHT ARM;  Surgeon: Angelia Mould, MD;  Location: [place];  Service: Vascular;  Laterality: Right;  . LIGATION OF ARTERIOVENOUS  FISTULA Left 12/19/2015   Procedure: LIGATION OF RADIOCEPHALIC ARTERIOVENOUS  FISTULA;  Surgeon: Conrad Shields, MD;  Location: [place];  Service: Vascular;  Laterality: Left;  Marland Kitchen MASS EXCISION Right 02/18/2017   Procedure: EXCISION OF RIGHT AXILLARY EPIDERMAL INCLUSION CYST;  Surgeon: Conrad Herrick, MD;  Location: [place];  Service: Vascular;  Laterality: Right;  . PERIPHERAL VASCULAR CATHETERIZATION N/A 12/14/2015   Procedure: Fistulagram;  Surgeon: Conrad Pulaski, MD;  Location: [place] CV LAB;  Service: Cardiovascular;  Laterality: N/A;  . SHOULDER SURGERY Right    fracture  . ULTRASOUND GUIDANCE FOR VASCULAR ACCESS  04/15/2017   Procedure: Ultrasound Guidance For Vascular Access;  Surgeon: Angelia Mould, MD;  Location: [place] CV LAB;  Service: Cardiovascular;;  . UPPER EXTREMITY VENOGRAPHY Bilateral 12/17/2016   Procedure: Bilateral Upper  Extremity Venography;  Surgeon: Serafina Mitchell, MD;  Location: [place] CV LAB;  Service: Cardiovascular;  Laterality: Bilateral;  . UPPER EXTREMITY VENOGRAPHY Left 11/03/2018   Procedure: UPPER EXTREMITY VENOGRAPHY;  Surgeon: Serafina Mitchell, MD;  Location: [place] CV LAB;  Service: Cardiovascular;  Laterality: Left;   Social History   Occupational History  . Occupation: retired, [employer]  Employer: RETIRED  Tobacco Use  . Smoking status: Former Smoker    Years: 25.00    Quit date: 04/29/2004    Years since quitting: 14.6  . Smokeless tobacco: Former Systems developer    Types: Chew    Quit date: 01/16/1987  . Tobacco comment: quit 2006  Substance and Sexual Activity  . Alcohol use: No  . Drug use: No  . Sexual activity: Not on file

## 2018-12-23 ENCOUNTER — Ambulatory Visit (INDEPENDENT_AMBULATORY_CARE_PROVIDER_SITE_OTHER): Payer: Medicare Other

## 2018-12-23 ENCOUNTER — Telehealth: Payer: Self-pay

## 2018-12-23 DIAGNOSIS — N2581 Secondary hyperparathyroidism of renal origin: Secondary | ICD-10-CM | POA: Diagnosis not present

## 2018-12-23 DIAGNOSIS — R55 Syncope and collapse: Secondary | ICD-10-CM

## 2018-12-23 DIAGNOSIS — D509 Iron deficiency anemia, unspecified: Secondary | ICD-10-CM | POA: Diagnosis not present

## 2018-12-23 DIAGNOSIS — N186 End stage renal disease: Secondary | ICD-10-CM | POA: Diagnosis not present

## 2018-12-23 DIAGNOSIS — Z992 Dependence on renal dialysis: Secondary | ICD-10-CM | POA: Diagnosis not present

## 2018-12-23 NOTE — Telephone Encounter (Signed)
[facility] called and said that pt had left graft placed this month and he is now having symptoms of steel syndrome.   Called and offered an appt for pt to be seen today but he is not able to come till next week per patient. Appt was made and discussed with patient the risk of waiting and advised if he gets worse to go to ER for evaluation.   York Cerise, CMA

## 2018-12-24 ENCOUNTER — Other Ambulatory Visit: Payer: Self-pay

## 2018-12-24 DIAGNOSIS — N186 End stage renal disease: Secondary | ICD-10-CM

## 2018-12-24 DIAGNOSIS — Z992 Dependence on renal dialysis: Secondary | ICD-10-CM

## 2018-12-25 DIAGNOSIS — I739 Peripheral vascular disease, unspecified: Secondary | ICD-10-CM | POA: Diagnosis not present

## 2018-12-25 DIAGNOSIS — N2581 Secondary hyperparathyroidism of renal origin: Secondary | ICD-10-CM | POA: Diagnosis not present

## 2018-12-25 DIAGNOSIS — L97529 Non-pressure chronic ulcer of other part of left foot with unspecified severity: Secondary | ICD-10-CM | POA: Diagnosis not present

## 2018-12-25 DIAGNOSIS — N186 End stage renal disease: Secondary | ICD-10-CM | POA: Diagnosis not present

## 2018-12-25 DIAGNOSIS — D509 Iron deficiency anemia, unspecified: Secondary | ICD-10-CM | POA: Diagnosis not present

## 2018-12-25 DIAGNOSIS — Z992 Dependence on renal dialysis: Secondary | ICD-10-CM | POA: Diagnosis not present

## 2018-12-28 ENCOUNTER — Other Ambulatory Visit: Payer: Self-pay

## 2018-12-28 ENCOUNTER — Other Ambulatory Visit: Payer: Self-pay | Admitting: *Deleted

## 2018-12-28 ENCOUNTER — Encounter: Payer: Self-pay | Admitting: *Deleted

## 2018-12-28 ENCOUNTER — Ambulatory Visit (HOSPITAL_COMMUNITY)
Admission: RE | Admit: 2018-12-28 | Discharge: 2018-12-28 | Disposition: A | Discharge status: Home / Self Care | Payer: Medicare Other | Source: Ambulatory Visit | Attending: Family | Admitting: Family

## 2018-12-28 ENCOUNTER — Ambulatory Visit (INDEPENDENT_AMBULATORY_CARE_PROVIDER_SITE_OTHER): Payer: Self-pay | Admitting: Family

## 2018-12-28 ENCOUNTER — Encounter: Payer: Self-pay | Admitting: Family

## 2018-12-28 VITALS — BP 123/71 | HR 84 | Temp 97.3°F | Resp 14 | Ht 65.0 in | Wt 158.0 lb

## 2018-12-28 DIAGNOSIS — N2581 Secondary hyperparathyroidism of renal origin: Secondary | ICD-10-CM | POA: Diagnosis not present

## 2018-12-28 DIAGNOSIS — Z992 Dependence on renal dialysis: Secondary | ICD-10-CM

## 2018-12-28 DIAGNOSIS — N186 End stage renal disease: Secondary | ICD-10-CM

## 2018-12-28 DIAGNOSIS — Z89431 Acquired absence of right foot: Secondary | ICD-10-CM

## 2018-12-28 DIAGNOSIS — T82898A Other specified complication of vascular prosthetic devices, implants and grafts, initial encounter: Secondary | ICD-10-CM

## 2018-12-28 DIAGNOSIS — I779 Disorder of arteries and arterioles, unspecified: Secondary | ICD-10-CM

## 2018-12-28 DIAGNOSIS — D509 Iron deficiency anemia, unspecified: Secondary | ICD-10-CM | POA: Diagnosis not present

## 2018-12-28 NOTE — Progress Notes (Signed)
CC: Severe steal left hand since 12-02-18  History of Present Illness  [name]. is a 78 y.o. ([birth year]) male who is s/p insertion of left upper arm AV graft on 12-01-18 by Dr. Scot Dock. Before this he was s/p right upper arm arteriovenous graft andExcision of right axillary epidermal inclusion cyst on 02-18-17 by Dr. Bridgett Larsson.  Pt states he lost a finger on his right hand due to lack of circulation.   Pt also has PAOD.  He returns today with c/o tingling and numbness in his left hand, mild during the day, wakes him up every night with pain in his left hand. He states the pain started in his left hand the day after the 12-01-18 insertion of left upper arm AVG.   He dialyzes MWF via right right side TDC, appears to be subclavian.   Venogram 11-03-18 by Dr. Trula Slade:  Impression:             #1  The patient has a patent central venous system however there does appear to be narrowing within the axillary vein.  The patient could be considered for left arm access however this would require simultaneous stenting versus angioplasty of the central venous system for placement of a hybrid versus HeRO graft.   Past Medical History:  Diagnosis Date  . Anxiety   . Aortic stenosis   . Arthritis   . CVA (cerebral infarction)   . ESRD (end stage renal disease) on dialysis [facility])    M/W/F at [facility] in [place]  . Essential hypertension    resolved with HD  . Gangrene of right foot ([place])   . Gastric ulcer 2004  . GI bleed    gastric ulcer  . Heart murmur   . History of blood transfusion   . History of cardiomyopathy    LVEF normal as of February 2017  . History of gastric ulcer   . History of stroke    Left side weakness  . Iron deficiency anemia   . Stroke [facility])    limp left    Social History Social History   Tobacco Use  . Smoking status: Former Smoker    Years: 25.00    Quit date: 04/29/2004    Years since quitting: 14.6  . Smokeless tobacco: Former Systems developer    Types: Chew    Quit  date: 01/16/1987  . Tobacco comment: quit 2006  Substance Use Topics  . Alcohol use: No  . Drug use: No    Family History Family History  Problem Relation Age of Onset  . Hypertension Mother   . Colon cancer Neg Hx   . Liver disease Neg Hx     Surgical History Past Surgical History:  Procedure Laterality Date  . ABDOMINAL AORTAGRAM N/A 01/24/2012   Procedure: ABDOMINAL Maxcine Ham;  Surgeon: Elam Dutch, MD;  Location: [facility] CATH LAB;  Service: Cardiovascular;  Laterality: N/A;  . ABDOMINAL AORTOGRAM W/LOWER EXTREMITY N/A 04/15/2017   Procedure: ABDOMINAL AORTOGRAM W/LOWER EXTREMITY;  Surgeon: Angelia Mould, MD;  Location: [place] CV LAB;  Service: Cardiovascular;  Laterality: N/A;  . AMPUTATION Right 05/09/2017   Procedure: RIGHT TRANSMETATARSAL AMPUTATION;  Surgeon: [name], MD;  Location: [place];  Service: Orthopedics;  Laterality: Right;  . AMPUTATION Right 06/13/2018   Procedure: AMPUTATION RIGHT LONG FINGER;  Surgeon: Dayna Barker, MD;  Location: [place];  Service: Plastics;  Laterality: Right;  . ARTERIOVENOUS GRAFT PLACEMENT Right right arm  . AV FISTULA PLACEMENT  Left 08/31/2015   Procedure: ARTERIOVENOUS (AV) FISTULA CREATION- LEFT RADIOCEPHALIC;  Surgeon: Mal Misty, MD;  Location: [place];  Service: Vascular;  Laterality: Left;  . AV FISTULA PLACEMENT Right 02/18/2017   Procedure: INSERTION OF ARTERIOVENOUS (AV) GORE-TEX GRAFT  RIGHT UPPER ARM;  Surgeon: Conrad [place], MD;  Location: [place];  Service: Vascular;  Laterality: Right;  . AV FISTULA PLACEMENT Left 12/01/2018   Procedure: INSERTION OF ARTERIOVENOUS (AV) GORE-TEX GRAFT ARM;  Surgeon: Angelia Mould, MD;  Location: [place];  Service: Vascular;  Laterality: Left;  . BASCILIC VEIN TRANSPOSITION Left 12/19/2015   Procedure: FIRST STAGE BRACHIAL VEIN TRANSPOSITION;  Surgeon: Conrad [place], MD;  Location: [place];  Service: Vascular;  Laterality: Left;  . BASCILIC VEIN TRANSPOSITION Left 02/08/2016    Procedure: SECOND STAGE BRACHIAL VEIN TRANSPOSITION;  Surgeon: Conrad [place], MD;  Location: [place];  Service: Vascular;  Laterality: Left;  . CARDIAC CATHETERIZATION N/A 07/11/2015   Procedure: Left Heart Cath and Coronary Angiography;  Surgeon: Troy Sine, MD;  Location: [place] CV LAB;  Service: Cardiovascular;  Laterality: N/A;  . Carpel Tunnel Left Dec. 22, 2016  . CHOLECYSTECTOMY    . COLONOSCOPY  2004   Dr. Irving Shows, left sided diverticula and cecal polyp, path unknown  . COLONOSCOPY  10/29/2011   Procedure: COLONOSCOPY;  Surgeon: Daneil Dolin, MD;  [facility];  Service: Endoscopy;  Laterality: N/A;  10:15  . ESOPHAGOGASTRODUODENOSCOPY  11/2002   Dr. Gala Romney, erosive reflux esophagitis, multiple gastric ulcer and antral/bulbar erosions. Serologies positive for H.Pylori and was treated  . ESOPHAGOGASTRODUODENOSCOPY  11/20014   Dr. Gala Romney, small hh only, ulcers healed  . ESOPHAGOGASTRODUODENOSCOPY  09/21/2011   Dr Trevor Iha HH, antral erosions, ?early GAVE  . FISTULOGRAM Left 12/10/2016   Procedure: THROMBECTOMY OF LEFT ARM ARTERIOVENOUS FISTULA;  Surgeon: Waynetta Sandy, MD;  Location: [place];  Service: Vascular;  Laterality: Left;  . INSERTION OF DIALYSIS CATHETER Left 12/10/2016   Procedure: INSERTION OF TUNNELED DIALYSIS CATHETER;  Surgeon: Waynetta Sandy, MD;  Location: [place];  Service: Vascular;  Laterality: Left;  . INSERTION OF DIALYSIS CATHETER Right 06/09/2018   Procedure: INSERTION OF DIALYSIS CATHETER Right subclavian;  Surgeon: Angelia Mould, MD;  Location: [place];  Service: Vascular;  Laterality: Right;  . IR DIALY SHUNT INTRO [place] W/IMG RIGHT Right 01/01/2018  . IR GENERIC HISTORICAL  07/16/2016   IR REMOVAL TUN CV CATH W/O FL 07/16/2016 Saverio Danker, PA-C [facility]  . IR PTA ADDL CENTRAL DIALYSIS SEG THRU DIALY CIRCUIT RIGHT Right 10/21/2017  . IR REMOVAL TUN CV CATH W/O FL  05/12/2017  . IR THROMBECTOMY AV  FISTULA W/THROMBOLYSIS/PTA INC/SHUNT/IMG RIGHT Right 10/21/2017  . IR THROMBECTOMY AV FISTULA W/THROMBOLYSIS/PTA INC/SHUNT/IMG RIGHT Right 11/27/2017  . IR US GUIDE VASC ACCESS RIGHT  10/21/2017  . IR US GUIDE VASC ACCESS RIGHT  11/27/2017  . IR US GUIDE VASC ACCESS RIGHT  01/01/2018  . LIGATION ARTERIOVENOUS GORTEX GRAFT Right 06/09/2018  . LIGATION ARTERIOVENOUS GORTEX GRAFT Right 06/09/2018   Procedure: LIGATION ARTERIOVENOUS GORTEX GRAFT RIGHT ARM;  Surgeon: Angelia Mould, MD;  Location: [place];  Service: Vascular;  Laterality: Right;  . LIGATION OF ARTERIOVENOUS  FISTULA Left 12/19/2015   Procedure: LIGATION OF RADIOCEPHALIC ARTERIOVENOUS  FISTULA;  Surgeon: Conrad , MD;  Location: [place];  Service: Vascular;  Laterality: Left;  Marland Kitchen MASS EXCISION Right 02/18/2017   Procedure: EXCISION OF RIGHT AXILLARY EPIDERMAL  INCLUSION CYST;  Surgeon: Conrad [place], MD;  Location: [place];  Service: Vascular;  Laterality: Right;  . PERIPHERAL VASCULAR CATHETERIZATION N/A 12/14/2015   Procedure: Fistulagram;  Surgeon: Conrad [place], MD;  Location: [place] CV LAB;  Service: Cardiovascular;  Laterality: N/A;  . SHOULDER SURGERY Right    fracture  . ULTRASOUND GUIDANCE FOR VASCULAR ACCESS  04/15/2017   Procedure: Ultrasound Guidance For Vascular Access;  Surgeon: Angelia Mould, MD;  Location: [place] CV LAB;  Service: Cardiovascular;;  . UPPER EXTREMITY VENOGRAPHY Bilateral 12/17/2016   Procedure: Bilateral Upper Extremity Venography;  Surgeon: Serafina Mitchell, MD;  Location: [place] CV LAB;  Service: Cardiovascular;  Laterality: Bilateral;  . UPPER EXTREMITY VENOGRAPHY Left 11/03/2018   Procedure: UPPER EXTREMITY VENOGRAPHY;  Surgeon: Serafina Mitchell, MD;  Location: [place] CV LAB;  Service: Cardiovascular;  Laterality: Left;    Allergies  Allergen Reactions  . Aspirin Other (See Comments)    Causes internal bleeding  History of ulcers    Current Outpatient Medications   Medication Sig Dispense Refill  . atorvastatin (LIPITOR) 40 MG tablet Take 40 mg by mouth daily.  3  . b complex-vitamin c-folic acid (NEPHRO-VITE) 0.8 MG TABS Take 1 tablet by mouth See admin instructions. Take one tablet daily on Tuesdays, Thursdays, Saturdays, and Sundays.    . cinacalcet (SENSIPAR) 60 MG tablet Take 60 mg by mouth every evening.     . meclizine (ANTIVERT) 25 MG tablet Take 25 mg by mouth 2 (two) times daily as needed for dizziness or nausea.     . midodrine (PROAMATINE) 10 MG tablet Take 10 mg by mouth See admin instructions. Take one tablet before dialysis.    Marland Kitchen nitroGLYCERIN (NITRODUR - DOSED IN MG/24 HR) 0.2 mg/hr patch Place 1 patch (0.2 mg total) onto the skin daily. Apply 1 patch to dorsum of foot daily. 30 patch 11  . Nutritional Supplements (FEEDING SUPPLEMENT, NEPRO CARB STEADY,) LIQD Take 237 mLs by mouth 2 (two) times daily between meals.  0  . oxyCODONE-acetaminophen (PERCOCET) 5-325 MG tablet Take 1 tablet by mouth every 6 (six) hours as needed for severe pain. 20 tablet 0  . sevelamer (RENVELA) 800 MG tablet Take 1,600-3,200 mg by mouth See admin instructions. Take 3200  mg by mouth 3 times daily with full meals and take 1600 mg by mouth with snacks.    . silver sulfADIAZINE (SILVADENE) 1 % cream Apply 1 application topically daily. 50 g 0   No current facility-administered medications for this visit.      REVIEW OF SYSTEMS: see HPI for pertinent positives and negatives    PHYSICAL EXAMINATION:  Vitals:   12/28/18 1601  BP: 123/71  Pulse: 84  Resp: 14  Temp: (!) 97.3 F (36.3 C)  TempSrc: Temporal  SpO2: 98%  Weight: 158 lb (71.7 kg)  Height: 5\' 5"  (1.651 m)   Body mass index is 26.29 kg/m.  General: The patient appears his stated age.  Walks with a cane HEENT:  No gross abnormalities Pulmonary: Respirations are non-labored, adequate air movement in all fields, no rales, rhonchi, or wheezes Abdomen: Soft and non-tender with normal bowel  sounds. Musculoskeletal: There are no major deformities. Right 3rd finger is absent.  Neurologic: sensation to light touch in both hands is equal, left hand grip strength is 4/5, right hand is 5/5 Skin: There are no ulcer or rashes noted. Psychiatric: The patient has normal affect. Cardiovascular: There is a regular rate and  rhythm without significant murmur appreciated.  TDC right upper chest. Left upper arm AVG with no palpable thrill, no audible bruit, is hard, knotty. Left radial pulse is not palpable  Non-Invasive Vascular Imaging  Left arm Access Duplex  (Date: 12/28/2018):  Findings: +------------------+-------------+----------+---------+----------+-------------+ Arteriovenous     Diameter (cm)Depth (cm)BranchingPSV (cm/s) Flow Volume  Graft                                                         (ml/min)    +------------------+-------------+----------+---------+----------+-------------+ Native Artery                                        130         391      Inflow                                                                    +------------------+-------------+----------+---------+----------+-------------+ Arterial                                             410                  Anastomosis                                                               +------------------+-------------+----------+---------+----------+-------------+ just after                                           159                  anastomosis                                                               +------------------+-------------+----------+---------+----------+-------------+ mid graft                         0.4                 72                  +------------------+-------------+----------+---------+----------+-------------+ distal graft  67                   +------------------+-------------+----------+---------+----------+-------------+ venous anastomosis                                    77                  +------------------+-------------+----------+---------+----------+-------------+ venous outflow                                        52                  +------------------+-------------+----------+---------+----------+-------------+ Summary: Patent arteriovenous graft with velocities and measurements noted above. Left radial artery antegrade. No appreciable change to left radial artery with proximal AVG compression.   Medical Decision Making   [place]. is a 77 y.o. male who has severe steal symptoms in his left hand, wakes him at night, is mild during the day. He states these symptoms started the day after the left upper arm AV graft placement on 12-01-18.   Left radial pulse is absent.   He dialyzes via right side chest TDC.   I discussed this with Dr. Trula Slade.  Will schedule for ligation of left arm AVG this week ASAP to restore arterial perfusion to left hand.  Clemon Chambers, RN, MSN, FNP-C [facility] Office: [phone]  12/28/2018, 4:42 PM  Clinic MD: Trula Slade

## 2018-12-29 ENCOUNTER — Other Ambulatory Visit: Payer: Self-pay | Admitting: *Deleted

## 2018-12-29 ENCOUNTER — Encounter (HOSPITAL_COMMUNITY): Payer: Self-pay | Admitting: *Deleted

## 2018-12-29 ENCOUNTER — Other Ambulatory Visit (HOSPITAL_COMMUNITY)
Admission: RE | Admit: 2018-12-29 | Discharge: 2018-12-29 | Disposition: A | Discharge status: Home / Self Care | Payer: Medicare Other | Source: Ambulatory Visit | Attending: Surgery | Admitting: Surgery

## 2018-12-29 ENCOUNTER — Other Ambulatory Visit: Payer: Self-pay

## 2018-12-29 DIAGNOSIS — N186 End stage renal disease: Secondary | ICD-10-CM | POA: Diagnosis not present

## 2018-12-29 DIAGNOSIS — T82898A Other specified complication of vascular prosthetic devices, implants and grafts, initial encounter: Secondary | ICD-10-CM | POA: Diagnosis not present

## 2018-12-29 DIAGNOSIS — Z992 Dependence on renal dialysis: Secondary | ICD-10-CM | POA: Diagnosis not present

## 2018-12-29 DIAGNOSIS — Z01812 Encounter for preprocedural laboratory examination: Secondary | ICD-10-CM | POA: Insufficient documentation

## 2018-12-29 DIAGNOSIS — Z20828 Contact with and (suspected) exposure to other viral communicable diseases: Secondary | ICD-10-CM | POA: Diagnosis not present

## 2018-12-29 DIAGNOSIS — D509 Iron deficiency anemia, unspecified: Secondary | ICD-10-CM | POA: Diagnosis not present

## 2018-12-29 DIAGNOSIS — N2581 Secondary hyperparathyroidism of renal origin: Secondary | ICD-10-CM | POA: Diagnosis not present

## 2018-12-29 LAB — SARS CORONAVIRUS 2 (TAT 6-24 HRS): SARS Coronavirus 2: NEGATIVE

## 2018-12-29 NOTE — Progress Notes (Signed)
Spoke with pt for pre-op call. Pt has hx of CAD and Aortic stenosis. Sees Dr. Domenic Polite. States he has a monitor that he is wearing now. Pt denies any recent chest pain or sob. Pt states he is not diabetic.  Pt had Covid test done today. States he understands about quarantine. He will have to go to dialysis tomorrow but knows he has to wear a mask.    Coronavirus Screening  Have you experienced the following symptoms:  Cough yes/no: No Fever (>100.8F)  yes/no: No Runny nose yes/no: No Sore throat yes/no: No Difficulty breathing/shortness of breath  yes/no: No  Have you or a family member traveled in the last 14 days and where? yes/no: No   Patient reminded that hospital visitation restrictions are in effect and the importance of the restrictions. Pt states he will have to stay overnight because he has no one to stay with him 24 hours after surgery.  When I asked pt which arm he was having surgery on, he stated that Dr. Donzetta Matters was going to be using his leg because his arm already has a graft and it's not working right. I have called and left Becky, RN at Dr. Claretha Cooper office a voicemail asking her to check into this.

## 2018-12-29 NOTE — Progress Notes (Signed)
Call to patient with changes for surgery on 12/31/2018. T arrive at 7:30 am or as directed by hospital pre-admission department. Dr. Donzetta Matters will be surgeon for ligation of A/V graft. Verbalized understanding.

## 2018-12-30 DIAGNOSIS — T82898A Other specified complication of vascular prosthetic devices, implants and grafts, initial encounter: Secondary | ICD-10-CM | POA: Diagnosis not present

## 2018-12-30 DIAGNOSIS — N186 End stage renal disease: Secondary | ICD-10-CM | POA: Diagnosis not present

## 2018-12-30 DIAGNOSIS — D509 Iron deficiency anemia, unspecified: Secondary | ICD-10-CM | POA: Diagnosis not present

## 2018-12-30 DIAGNOSIS — Z992 Dependence on renal dialysis: Secondary | ICD-10-CM | POA: Diagnosis not present

## 2018-12-30 DIAGNOSIS — N2581 Secondary hyperparathyroidism of renal origin: Secondary | ICD-10-CM | POA: Diagnosis not present

## 2018-12-30 NOTE — Anesthesia Preprocedure Evaluation (Addendum)
Anesthesia Evaluation  Patient identified by MRN, date of birth, ID band Patient awake    Reviewed: Allergy & Precautions, NPO status , Patient's Chart, lab work & pertinent test results  History of Anesthesia Complications Negative for: history of anesthetic complications  Airway Mallampati: II  TM Distance: >3 FB Neck ROM: Full    Dental  (+) Edentulous Upper, Edentulous Lower   Pulmonary former smoker,    Pulmonary exam normal        Cardiovascular hypertension, + CAD and + Peripheral Vascular Disease  + Valvular Problems/Murmurs AS and AI  Rhythm:Regular Rate:Normal + Systolic murmurs  '20 TTE - EF 55-60%. There is moderately increased left ventricular wall thickness. Left ventricular diastolic Doppler parameters are consistent with impaired relaxation. Left atrial size was severely dilated. Aortic valve regurgitation is moderate by color flow Doppler. Moderate-severe stenosis of the aortic valve.  Mean gradient 38 mmHg, dimensionless inex 0.28, AVA VTI 0.84. Pulmonary hypertension is indeterminate    Neuro/Psych PSYCHIATRIC DISORDERS Anxiety CVA, Residual Symptoms    GI/Hepatic Neg liver ROS, PUD,   Endo/Other  negative endocrine ROS  Renal/GU Dialysis and ESRFRenal disease     Musculoskeletal  (+) Arthritis ,   Abdominal   Peds  Hematology negative hematology ROS (+)   Anesthesia Other Findings   Reproductive/Obstetrics                           Anesthesia Physical Anesthesia Plan  ASA: IV  Anesthesia Plan: MAC   Post-op Pain Management:    Induction: Intravenous  PONV Risk Score and Plan: 1 and Propofol infusion and Treatment may vary due to age or medical condition  Airway Management Planned: Natural Airway and Simple Face Mask  Additional Equipment: None  Intra-op Plan:   Post-operative Plan:   Informed Consent: I have reviewed the patients History and Physical,  chart, labs and discussed the procedure including the risks, benefits and alternatives for the proposed anesthesia with the patient or authorized representative who has indicated his/her understanding and acceptance.       Plan Discussed with: CRNA and Anesthesiologist  Anesthesia Plan Comments:       Anesthesia Quick Evaluation

## 2018-12-30 NOTE — Progress Notes (Signed)
Anesthesia Chart Review: SAME DAY WORK-UP   Case: [id number] Date/Time: 12/31/18 3762   Procedure: LIGATION ARTERIOVENOUS GORTEX GRAFT LEFT ARM (Left )   Anesthesia type: Choice   Pre-op diagnosis: STEAL SYNDROME LEFT HAND   Location: [facility] [place] OR ROOM 11 / [place] OR   Surgeon: Waynetta Sandy, MD      DISCUSSION: Patient is a 78 year old male scheduled for the above procedure. He is s/p insertion of LUE AVGG 12/01/18 and is currently getting hemodialyzed (MWF, [facility]) via a right Ina vein tunneled catheter.  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    Patient was evaluated (via telemedicine) seen by cardiologist Dr. Domenic Polite on 11/25/18 for lightheadedness/near syncope. Episodes more frequent after HD. On midodrine for presumed hypotension with dialysis. Given evidence of conduction system disease with RBBB and history of AS, a 14 day ZioPatch and repeat echo ordered. The ZioPatch had to be redone due to patient's monitor not working properly and only four days worth of data. Echo was repeated (see below) and showed progression from moderate to moderate-severe AS. Six month follow-up recommended (due ~ 05/2019).   If labs acceptable and no acute changes/symptoms then would anticipate that he could proceed as planned.  Anesthesia team to evaluate on the day of surgery.  12/29/18 Presurgical COVID test negative.    PROVIDERS: [name], MD is listed as PCP Fran Lowes, MD is nephrologist Rozann Lesches, MD is cardiologist   LABS: He is for updated labs prior to surgery.   IMAGES: CXR  08/02/18: IMPRESSION: 1. No acute cardiopulmonary disease. 2. Thickened bronchovascular markings and interstitial markings are chronic, stable from prior studies dating to 03/31/2017.    EKG: 08/02/18:  Sinus tachycardia Multiform ventricular premature complexes Right bundle branch block No significant change since last tracing 13 Jun 2018 Confirmed by [name] [phone]) on 08/02/2018 6:16:11 AM   CV: Echo 12/08/18: IMPRESSIONS  1. The left ventricle has normal systolic function, with an ejection fraction of 55-60%. The cavity size was normal. There is moderately increased left ventricular wall thickness. Left ventricular diastolic Doppler parameters are consistent with  impaired relaxation. Elevated mean left atrial pressure.  2. The right ventricle has normal systolic function. The cavity was normal. There is no increase in right ventricular wall thickness.  3. Left atrial size was severely dilated.  4. Mild thickening of the mitral valve leaflet. Mild calcification of the mitral valve leaflet. There is moderate mitral annular calcification present. No evidence of mitral valve stenosis.  5. The aortic valve has an indeterminate number of cusps. Moderate thickening of the aortic valve. Moderate calcification of the aortic valve. Aortic valve regurgitation is moderate by color flow Doppler. Moderate-severe stenosis of the aortic valve.  Moderate aortic annular calcification noted.  6. Moderate to severe aortic stenosis. The highest gradient is noted with the pedhoff probe. Mean gradient 38 mmHg, dimensionless inex 0.28, AVA VTI 0.84.  7. The aorta is normal in size and structure.  8. Pulmonary hypertension is indeterminate, inadequate TR jet.  9. The interatrial septum was not well visualized. (Comparison echo 06/27/15 showed moderate AS. Valve area (VTI): 0.89 cm^2. Valve area (Vmax):1.02 cm^2. Valve area (Vmean): 0.91 cm^2.)  Cardiac cath 07/11/15 (done for abnormal 06/27/15 stress  test, EF 36%, apical-inferior perfusion defect, inferior hypokinesis):  Prox LAD lesion, 45% stenosed.  There is moderate to severe left ventricular systolic dysfunction. - Nonischemic cardiomyopathy with global hypokinesis with more pronounced inferior hypocontractility and overall ejection fraction at 35%. - Mild coronary calcification with 40-50% eccentric proximal LAD stenosis just prior to a septal perforating artery; mild calcification of the proximal left circumflex coronary artery; and large dominant RCA without significant obstructive stenoses. - Probably at least mild aortic stenosis with LV to AO gradient ranging from 10 -18 mm Hg in this patient with reduced LV function; LVEDP 13 mm Hg.  RECOMMENDATION: Medical therapy for his cardiomyopathy and mild CAD. Consider a dobutamine stress echo if concern for low gradient AS with reduced LV function.    Past Medical History:  Diagnosis Date  . Anxiety   . Aortic stenosis   . Arthritis   . CVA (cerebral infarction)   . ESRD (end stage renal disease) on dialysis [facility])    M/W/F at [facility] in [place]  . Essential hypertension    resolved with HD  . Gangrene of right foot ([place])   . Gastric ulcer 2004  . GI bleed    gastric ulcer  . Heart murmur   . History of blood transfusion   . History of cardiomyopathy    LVEF normal as of February 2017  . History of gastric ulcer   . History of stroke    Left side weakness  . Iron deficiency anemia   . Stroke [facility])    limp left    Past Surgical History:  Procedure Laterality Date  . ABDOMINAL AORTAGRAM N/A 01/24/2012   Procedure: ABDOMINAL Maxcine Ham;  Surgeon: Elam Dutch, MD;  Location: [facility] C[facility] LAB;  Service: Cardiovascular;  Laterality: N/A;  . ABDOMINAL AORTOGRAM W/LOWER EXTREMITY N/A 04/15/2017   Procedure: ABDOMINAL AORTOGRAM W/LOWER EXTREMITY;  Surgeon: Angelia Mould, MD;  Location: [place] CV LAB;  Service: Cardiovascular;  Laterality: N/A;  .  AMPUTATION Right 05/09/2017   Procedure: RIGHT TRANSMETATARSAL AMPUTATION;  Surgeon: [name], MD;  Location: [place];  Service: Orthopedics;  Laterality: Right;  . AMPUTATION Right 06/13/2018   Procedure: AMPUTATION RIGHT LONG FINGER;  Surgeon: Dayna Barker, MD;  Location: [place];  Service: Plastics;  Laterality: Right;  . ARTERIOVENOUS GRAFT PLACEMENT Right right arm  . AV FISTULA PLACEMENT Left 08/31/2015   Procedure: ARTERIOVENOUS (AV) FISTULA CREATION- LEFT RADIOCEPHALIC;  Surgeon: Mal Misty, MD;  Location: [place];  Service: Vascular;  Laterality: Left;  . AV FISTULA PLACEMENT Right 02/18/2017   Procedure: INSERTION OF ARTERIOVENOUS (AV) GORE-TEX GRAFT  RIGHT UPPER ARM;  Surgeon: Conrad Vandalia, MD;  Location: [place];  Service: Vascular;  Laterality: Right;  . AV FISTULA PLACEMENT Left 12/01/2018   Procedure: INSERTION OF ARTERIOVENOUS (AV) GORE-TEX GRAFT ARM;  Surgeon: Angelia Mould, MD;  Location: [place];  Service: Vascular;  Laterality: Left;  . BASCILIC VEIN TRANSPOSITION Left 12/19/2015   Procedure: FIRST STAGE BRACHIAL VEIN TRANSPOSITION;  Surgeon: Conrad Avon, MD;  Location: [place];  Service: Vascular;  Laterality: Left;  . BASCILIC VEIN TRANSPOSITION Left 02/08/2016   Procedure: SECOND STAGE BRACHIAL VEIN TRANSPOSITION;  Surgeon: Conrad Nelchina, MD;  Location: [place];  Service: Vascular;  Laterality: Left;  . CARDIAC CATHETERIZATION N/A 07/11/2015   Procedure: Left Heart Cath and Coronary Angiography;  Surgeon: Troy Sine, MD;  Location: [place] CV LAB;  Service: Cardiovascular;  Laterality: N/A;  . Carpel Tunnel Left Dec. 22, 2016  . CHOLECYSTECTOMY    . COLONOSCOPY  2004   Dr. Irving Shows, left sided diverticula and cecal polyp, path unknown  . COLONOSCOPY  10/29/2011   Procedure: COLONOSCOPY;  Surgeon: Daneil Dolin, MD;  [facility];  Service: Endoscopy;  Laterality: N/A;  10:15  . ESOPHAGOGASTRODUODENOSCOPY  11/2002   Dr. Gala Romney, erosive reflux  esophagitis, multiple gastric ulcer and antral/bulbar erosions. Serologies positive for H.Pylori and was treated  . ESOPHAGOGASTRODUODENOSCOPY  11/20014   Dr. Gala Romney, small hh only, ulcers healed  . ESOPHAGOGASTRODUODENOSCOPY  09/21/2011   Dr Trevor Iha [facility], antral erosions, ?early GAVE  . FISTULOGRAM Left 12/10/2016   Procedure: THROMBECTOMY OF LEFT ARM ARTERIOVENOUS FISTULA;  Surgeon: Waynetta Sandy, MD;  Location: [place];  Service: Vascular;  Laterality: Left;  . INSERTION OF DIALYSIS CATHETER Left 12/10/2016   Procedure: INSERTION OF TUNNELED DIALYSIS CATHETER;  Surgeon: Waynetta Sandy, MD;  Location: [place];  Service: Vascular;  Laterality: Left;  . INSERTION OF DIALYSIS CATHETER Right 06/09/2018   Procedure: INSERTION OF DIALYSIS CATHETER Right subclavian;  Surgeon: Angelia Mould, MD;  Location: [place];  Service: Vascular;  Laterality: Right;  . IR DIALY SHUNT INTRO [place] W/IMG RIGHT Right 01/01/2018  . IR GENERIC HISTORICAL  07/16/2016   IR REMOVAL TUN CV CATH W/O FL 07/16/2016 Saverio Danker, PA-C [facility]-INTERV RAD  . IR PTA ADDL CENTRAL DIALYSIS SEG THRU DIALY CIRCUIT RIGHT Right 10/21/2017  . IR REMOVAL TUN CV CATH W/O FL  05/12/2017  . IR THROMBECTOMY AV FISTULA W/THROMBOLYSIS/PTA INC/SHUNT/IMG RIGHT Right 10/21/2017  . IR THROMBECTOMY AV FISTULA W/THROMBOLYSIS/PTA INC/SHUNT/IMG RIGHT Right 11/27/2017  . IR US GUIDE VASC ACCESS RIGHT  10/21/2017  . IR US GUIDE VASC ACCESS RIGHT  11/27/2017  . IR US GUIDE VASC ACCESS RIGHT  01/01/2018  . LIGATION ARTERIOVENOUS GORTEX GRAFT Right 06/09/2018  . LIGATION ARTERIOVENOUS GORTEX GRAFT Right 06/09/2018   Procedure: LIGATION ARTERIOVENOUS GORTEX GRAFT RIGHT ARM;  Surgeon: Angelia Mould, MD;  Location: [place];  Service: Vascular;  Laterality: Right;  . LIGATION OF ARTERIOVENOUS  FISTULA Left 12/19/2015   Procedure: LIGATION OF RADIOCEPHALIC ARTERIOVENOUS  FISTULA;  Surgeon: Conrad Mendon, MD;  Location: [place];   Service: Vascular;  Laterality: Left;  Marland Kitchen MASS EXCISION Right 02/18/2017   Procedure: EXCISION OF RIGHT AXILLARY EPIDERMAL INCLUSION CYST;  Surgeon: Conrad Amherst, MD;  Location: [place];  Service: Vascular;  Laterality: Right;  . PERIPHERAL VASCULAR CATHETERIZATION N/A 12/14/2015   Procedure: Fistulagram;  Surgeon: Conrad Gilbert, MD;  Location: [place] CV LAB;  Service: Cardiovascular;  Laterality: N/A;  . SHOULDER SURGERY Right    fracture  . ULTRASOUND GUIDANCE FOR VASCULAR ACCESS  04/15/2017   Procedure: Ultrasound Guidance For Vascular Access;  Surgeon: Angelia Mould, MD;  Location: [place] CV LAB;  Service: Cardiovascular;;  . UPPER EXTREMITY VENOGRAPHY Bilateral 12/17/2016   Procedure: Bilateral Upper Extremity Venography;  Surgeon: Serafina Mitchell, MD;  Location: [place] CV LAB;  Service: Cardiovascular;  Laterality: Bilateral;  . UPPER EXTREMITY VENOGRAPHY Left 11/03/2018   Procedure: UPPER EXTREMITY VENOGRAPHY;  Surgeon: Serafina Mitchell, MD;  Location: [place] CV LAB;  Service: Cardiovascular;  Laterality: Left;    MEDICATIONS: No current facility-administered medications for this encounter.    Marland Kitchen atorvastatin (LIPITOR) 40 MG tablet  . b complex-vitamin c-folic acid (NEPHRO-VITE) 0.8 MG TABS  . cinacalcet (SENSIPAR) 60  MG tablet  . meclizine (ANTIVERT) 25 MG tablet  . midodrine (PROAMATINE) 10 MG tablet  . nitroGLYCERIN (NITRODUR - DOSED IN MG/24 HR) 0.2 mg/hr patch  . Nutritional Supplements (FEEDING SUPPLEMENT, NEPRO CARB STEADY,) LIQD  . oxyCODONE-acetaminophen (PERCOCET) 5-325 MG tablet  . sevelamer (RENVELA) 800 MG tablet  . silver sulfADIAZINE (SILVADENE) 1 % cream    Myra Gianotti, PA-C Surgical Short Stay/Anesthesiology [facility] Phone [phone] [facility] Phone [phone] 12/30/2018 10:27 AM

## 2018-12-31 ENCOUNTER — Ambulatory Visit: Payer: Medicare Other | Admitting: Orthopedic Surgery

## 2018-12-31 ENCOUNTER — Observation Stay (HOSPITAL_COMMUNITY)
Admission: RE | Admit: 2018-12-31 | Discharge: 2019-01-01 | Disposition: A | Discharge status: Home / Self Care | Payer: Medicare Other | Attending: Vascular Surgery | Admitting: Vascular Surgery

## 2018-12-31 ENCOUNTER — Ambulatory Visit (HOSPITAL_COMMUNITY): Payer: Medicare Other | Admitting: Vascular Surgery

## 2018-12-31 ENCOUNTER — Other Ambulatory Visit: Payer: Self-pay

## 2018-12-31 ENCOUNTER — Encounter (HOSPITAL_COMMUNITY): Payer: Self-pay | Admitting: *Deleted

## 2018-12-31 ENCOUNTER — Encounter (HOSPITAL_COMMUNITY)
Admission: RE | Disposition: A | Discharge status: Home / Self Care | Payer: Self-pay | Source: Home / Self Care | Attending: Vascular Surgery

## 2018-12-31 DIAGNOSIS — Z886 Allergy status to analgesic agent status: Secondary | ICD-10-CM | POA: Diagnosis not present

## 2018-12-31 DIAGNOSIS — I428 Other cardiomyopathies: Secondary | ICD-10-CM | POA: Insufficient documentation

## 2018-12-31 DIAGNOSIS — Y832 Surgical operation with anastomosis, bypass or graft as the cause of abnormal reaction of the patient, or of later complication, without mention of misadventure at the time of the procedure: Secondary | ICD-10-CM | POA: Diagnosis not present

## 2018-12-31 DIAGNOSIS — Z95828 Presence of other vascular implants and grafts: Secondary | ICD-10-CM | POA: Insufficient documentation

## 2018-12-31 DIAGNOSIS — G5600 Carpal tunnel syndrome, unspecified upper limb: Secondary | ICD-10-CM | POA: Diagnosis not present

## 2018-12-31 DIAGNOSIS — Z79899 Other long term (current) drug therapy: Secondary | ICD-10-CM | POA: Insufficient documentation

## 2018-12-31 DIAGNOSIS — I739 Peripheral vascular disease, unspecified: Secondary | ICD-10-CM | POA: Insufficient documentation

## 2018-12-31 DIAGNOSIS — Z8614 Personal history of Methicillin resistant Staphylococcus aureus infection: Secondary | ICD-10-CM | POA: Diagnosis not present

## 2018-12-31 DIAGNOSIS — Z8673 Personal history of transient ischemic attack (TIA), and cerebral infarction without residual deficits: Secondary | ICD-10-CM | POA: Insufficient documentation

## 2018-12-31 DIAGNOSIS — T82898A Other specified complication of vascular prosthetic devices, implants and grafts, initial encounter: Principal | ICD-10-CM | POA: Insufficient documentation

## 2018-12-31 DIAGNOSIS — Z8619 Personal history of other infectious and parasitic diseases: Secondary | ICD-10-CM | POA: Diagnosis not present

## 2018-12-31 DIAGNOSIS — I120 Hypertensive chronic kidney disease with stage 5 chronic kidney disease or end stage renal disease: Secondary | ICD-10-CM | POA: Insufficient documentation

## 2018-12-31 DIAGNOSIS — Z89421 Acquired absence of other right toe(s): Secondary | ICD-10-CM | POA: Diagnosis not present

## 2018-12-31 DIAGNOSIS — I2510 Atherosclerotic heart disease of native coronary artery without angina pectoris: Secondary | ICD-10-CM | POA: Diagnosis not present

## 2018-12-31 DIAGNOSIS — Z992 Dependence on renal dialysis: Secondary | ICD-10-CM | POA: Insufficient documentation

## 2018-12-31 DIAGNOSIS — M1990 Unspecified osteoarthritis, unspecified site: Secondary | ICD-10-CM | POA: Insufficient documentation

## 2018-12-31 DIAGNOSIS — Z87891 Personal history of nicotine dependence: Secondary | ICD-10-CM | POA: Insufficient documentation

## 2018-12-31 DIAGNOSIS — N186 End stage renal disease: Secondary | ICD-10-CM | POA: Diagnosis not present

## 2018-12-31 DIAGNOSIS — I711 Thoracic aortic aneurysm, ruptured: Secondary | ICD-10-CM | POA: Diagnosis not present

## 2018-12-31 DIAGNOSIS — I771 Stricture of artery: Secondary | ICD-10-CM | POA: Diagnosis not present

## 2018-12-31 HISTORY — PX: LIGATION ARTERIOVENOUS GORTEX GRAFT: SHX5947

## 2018-12-31 LAB — CREATININE, SERUM
Creatinine, Ser: 8.08 mg/dL — ABNORMAL HIGH (ref 0.61–1.24)
GFR calc Af Amer: 7 mL/min — ABNORMAL LOW (ref >60)
GFR calc non Af Amer: 6 mL/min — ABNORMAL LOW (ref >60)

## 2018-12-31 LAB — GLUCOSE, CAPILLARY
Glucose-Capillary: 64 mg/dL — ABNORMAL LOW (ref 70–99)
Glucose-Capillary: 83 mg/dL (ref 70–99)
Glucose-Capillary: 95 mg/dL (ref 70–99)

## 2018-12-31 LAB — CBC
HCT: 44.3 % (ref 39.0–52.0)
Hemoglobin: 13.8 g/dL (ref 13.0–17.0)
MCH: 21 pg — ABNORMAL LOW (ref 26.0–34.0)
MCHC: 31.2 g/dL (ref 30.0–36.0)
MCV: 67.3 fL — ABNORMAL LOW (ref 80.0–100.0)
Platelets: 154 10*3/uL (ref 150–400)
RBC: 6.58 MIL/uL — ABNORMAL HIGH (ref 4.22–5.81)
RDW: 19 % — ABNORMAL HIGH (ref 11.5–15.5)
WBC: 7.3 10*3/uL (ref 4.0–10.5)
nRBC: 0 % (ref 0.0–0.2)

## 2018-12-31 LAB — POCT I-STAT 4, (NA,K, GLUC, HGB,HCT)
Glucose, Bld: 93 mg/dL (ref 70–99)
HCT: 50 % (ref 39.0–52.0)
Hemoglobin: 17 g/dL (ref 13.0–17.0)
Potassium: 4 mmol/L (ref 3.5–5.1)
Sodium: 137 mmol/L (ref 135–145)

## 2018-12-31 LAB — SURGICAL PCR SCREEN
MRSA, PCR: POSITIVE — AB
Staphylococcus aureus: POSITIVE — AB

## 2018-12-31 SURGERY — LIGATION ARTERIOVENOUS GORTEX GRAFT
Anesthesia: Monitor Anesthesia Care | Site: Arm Upper | Laterality: Left

## 2018-12-31 MED ORDER — ALBUMIN HUMAN 25 % IV SOLN
12.5000 g | Freq: Once | INTRAVENOUS | Status: DC
  Filled 2018-12-31: qty 50

## 2018-12-31 MED ORDER — FENTANYL CITRATE (PF) 100 MCG/2ML IJ SOLN
25.0000 ug | INTRAMUSCULAR | Status: DC | PRN
  Administered 2018-12-31: 12.5 ug via INTRAVENOUS

## 2018-12-31 MED ORDER — FENTANYL CITRATE (PF) 100 MCG/2ML IJ SOLN
INTRAMUSCULAR | Status: DC | PRN
  Administered 2018-12-31 (×4): 25 ug via INTRAVENOUS

## 2018-12-31 MED ORDER — FENTANYL CITRATE (PF) 100 MCG/2ML IJ SOLN
INTRAMUSCULAR | Status: AC
  Filled 2018-12-31: qty 2

## 2018-12-31 MED ORDER — PANTOPRAZOLE SODIUM 40 MG PO TBEC
40.0000 mg | DELAYED_RELEASE_TABLET | Freq: Every day | ORAL | Status: DC
  Administered 2018-12-31 – 2019-01-01 (×2): 40 mg via ORAL
  Filled 2018-12-31 (×2): qty 1

## 2018-12-31 MED ORDER — OXYCODONE-ACETAMINOPHEN 5-325 MG PO TABS
1.0000 | ORAL_TABLET | ORAL | Status: DC | PRN
  Administered 2018-12-31 – 2019-01-01 (×2): 2 via ORAL
  Filled 2018-12-31 (×2): qty 2

## 2018-12-31 MED ORDER — ONDANSETRON HCL 4 MG/2ML IJ SOLN: 4.0000 mg | Freq: Four times a day (QID) | INTRAMUSCULAR | Status: DC | PRN

## 2018-12-31 MED ORDER — POTASSIUM CHLORIDE CRYS ER 20 MEQ PO TBCR: 20.0000 meq | EXTENDED_RELEASE_TABLET | Freq: Once | ORAL | Status: DC

## 2018-12-31 MED ORDER — ALUM & MAG HYDROXIDE-SIMETH 200-200-20 MG/5ML PO SUSP: 15.0000 mL | ORAL | Status: DC | PRN

## 2018-12-31 MED ORDER — ATORVASTATIN CALCIUM 40 MG PO TABS
40.0000 mg | ORAL_TABLET | Freq: Every day | ORAL | Status: DC
  Administered 2018-12-31: 17:00:00 40 mg via ORAL
  Filled 2018-12-31: qty 1

## 2018-12-31 MED ORDER — LABETALOL HCL 5 MG/ML IV SOLN: 10.0000 mg | INTRAVENOUS | Status: DC | PRN

## 2018-12-31 MED ORDER — ONDANSETRON HCL 4 MG/2ML IJ SOLN: 4.0000 mg | Freq: Once | INTRAMUSCULAR | Status: DC | PRN

## 2018-12-31 MED ORDER — GUAIFENESIN-DM 100-10 MG/5ML PO SYRP: 15.0000 mL | ORAL_SOLUTION | ORAL | Status: DC | PRN

## 2018-12-31 MED ORDER — ALBUMIN HUMAN 5 % IV SOLN
INTRAVENOUS | Status: AC
  Administered 2018-12-31: 13:00:00 12.5 g
  Filled 2018-12-31: qty 250

## 2018-12-31 MED ORDER — CEFAZOLIN SODIUM-DEXTROSE 2-4 GM/100ML-% IV SOLN
2.0000 g | INTRAVENOUS | Status: AC
  Administered 2018-12-31: 2 g via INTRAVENOUS
  Filled 2018-12-31: qty 100

## 2018-12-31 MED ORDER — SODIUM CHLORIDE 0.9 % IV SOLN
INTRAVENOUS | Status: AC
  Filled 2018-12-31: qty 1.2

## 2018-12-31 MED ORDER — MIDODRINE HCL 5 MG PO TABS
10.0000 mg | ORAL_TABLET | ORAL | Status: DC
  Administered 2019-01-01: 10 mg via ORAL
  Filled 2018-12-31: qty 2

## 2018-12-31 MED ORDER — PROPOFOL 10 MG/ML IV BOLUS
INTRAVENOUS | Status: AC
  Filled 2018-12-31: qty 20

## 2018-12-31 MED ORDER — SODIUM CHLORIDE 0.9 % IV SOLN
INTRAVENOUS | Status: DC
  Administered 2018-12-31: 16:00:00 via INTRAVENOUS

## 2018-12-31 MED ORDER — OXYCODONE HCL 5 MG PO TABS
5.0000 mg | ORAL_TABLET | Freq: Once | ORAL | Status: AC | PRN
  Administered 2018-12-31: 5 mg via ORAL

## 2018-12-31 MED ORDER — PHENYLEPHRINE HCL (PRESSORS) 10 MG/ML IV SOLN
INTRAVENOUS | Status: DC | PRN
  Administered 2018-12-31: 80 ug via INTRAVENOUS

## 2018-12-31 MED ORDER — OXYCODONE HCL 5 MG/5ML PO SOLN: 5.0000 mg | Freq: Once | ORAL | Status: AC | PRN

## 2018-12-31 MED ORDER — SODIUM CHLORIDE 0.9 % IV SOLN
INTRAVENOUS | Status: DC | PRN
  Administered 2018-12-31: 100 ug/min via INTRAVENOUS

## 2018-12-31 MED ORDER — PROPOFOL 10 MG/ML IV BOLUS
INTRAVENOUS | Status: DC | PRN
  Administered 2018-12-31: 30 mg via INTRAVENOUS

## 2018-12-31 MED ORDER — LIDOCAINE 2% (20 MG/ML) 5 ML SYRINGE
INTRAMUSCULAR | Status: DC | PRN
  Administered 2018-12-31: 80 mg via INTRAVENOUS

## 2018-12-31 MED ORDER — LIDOCAINE HCL (PF) 1 % IJ SOLN
INTRAMUSCULAR | Status: DC | PRN
  Administered 2018-12-31: 15 mL via SUBCUTANEOUS

## 2018-12-31 MED ORDER — HEMOSTATIC AGENTS (NO CHARGE) OPTIME
TOPICAL | Status: DC | PRN
  Administered 2018-12-31: 1 via TOPICAL

## 2018-12-31 MED ORDER — LIDOCAINE HCL (PF) 1 % IJ SOLN
INTRAMUSCULAR | Status: AC
  Filled 2018-12-31: qty 30

## 2018-12-31 MED ORDER — METOPROLOL TARTRATE 5 MG/5ML IV SOLN: 2.0000 mg | INTRAVENOUS | Status: DC | PRN

## 2018-12-31 MED ORDER — HEPARIN SODIUM (PORCINE) 5000 UNIT/ML IJ SOLN: 5000.0000 [IU] | Freq: Three times a day (TID) | INTRAMUSCULAR | Status: DC

## 2018-12-31 MED ORDER — PHENOL 1.4 % MT LIQD: 1.0000 | OROMUCOSAL | Status: DC | PRN

## 2018-12-31 MED ORDER — CHLORHEXIDINE GLUCONATE 4 % EX LIQD: 60.0000 mL | Freq: Once | CUTANEOUS | Status: DC

## 2018-12-31 MED ORDER — MUPIROCIN 2 % EX OINT
1.0000 "application " | TOPICAL_OINTMENT | Freq: Once | CUTANEOUS | Status: AC
  Administered 2018-12-31: 1 via TOPICAL
  Filled 2018-12-31: qty 22

## 2018-12-31 MED ORDER — HYDRALAZINE HCL 20 MG/ML IJ SOLN: 5.0000 mg | INTRAMUSCULAR | Status: DC | PRN

## 2018-12-31 MED ORDER — 0.9 % SODIUM CHLORIDE (POUR BTL) OPTIME
TOPICAL | Status: DC | PRN
  Administered 2018-12-31: 1000 mL

## 2018-12-31 MED ORDER — CINACALCET HCL 30 MG PO TABS
60.0000 mg | ORAL_TABLET | Freq: Every evening | ORAL | Status: DC
  Administered 2018-12-31: 17:00:00 60 mg via ORAL
  Filled 2018-12-31: qty 2

## 2018-12-31 MED ORDER — OXYCODONE HCL 5 MG PO TABS
ORAL_TABLET | ORAL | Status: AC
  Filled 2018-12-31: qty 1

## 2018-12-31 MED ORDER — MORPHINE SULFATE (PF) 2 MG/ML IV SOLN: 1.0000 mg | INTRAVENOUS | Status: DC | PRN

## 2018-12-31 MED ORDER — 0.9 % SODIUM CHLORIDE (POUR BTL) OPTIME
TOPICAL | Status: DC | PRN
  Administered 2018-12-31: 11:00:00 500 mL

## 2018-12-31 MED ORDER — PROPOFOL 500 MG/50ML IV EMUL
INTRAVENOUS | Status: DC | PRN
  Administered 2018-12-31: 80 ug/kg/min via INTRAVENOUS

## 2018-12-31 MED ORDER — SODIUM CHLORIDE 0.9 % IV SOLN
INTRAVENOUS | Status: DC
  Administered 2018-12-31: 10:00:00 via INTRAVENOUS

## 2018-12-31 SURGICAL SUPPLY — 41 items
ADH SKN CLS APL DERMABOND .7 (GAUZE/BANDAGES/DRESSINGS) ×1
BNDG CMPR 9X4 STRL LF SNTH (GAUZE/BANDAGES/DRESSINGS) ×1
BNDG ELASTIC 4X5.8 VLCR STR LF (GAUZE/BANDAGES/DRESSINGS) ×2 IMPLANT
BNDG ESMARK 4X9 LF (GAUZE/BANDAGES/DRESSINGS) ×2 IMPLANT
CANISTER SUCT 3000ML PPV (MISCELLANEOUS) ×3 IMPLANT
CLIP VESOCCLUDE MED 6/CT (CLIP) ×2 IMPLANT
CLIP VESOCCLUDE SM WIDE 6/CT (CLIP) ×2 IMPLANT
COVER PROBE W GEL 5X96 (DRAPES) ×3 IMPLANT
COVER WAND RF STERILE (DRAPES) ×3 IMPLANT
CUFF TOURN SGL QUICK 18X4 (TOURNIQUET CUFF) ×2 IMPLANT
DERMABOND ADVANCED (GAUZE/BANDAGES/DRESSINGS) ×2
DERMABOND ADVANCED .7 DNX12 (GAUZE/BANDAGES/DRESSINGS) ×1 IMPLANT
ELECT REM PT RETURN 9FT ADLT (ELECTROSURGICAL) ×3
ELECTRODE REM PT RTRN 9FT ADLT (ELECTROSURGICAL) ×1 IMPLANT
GAUZE SPONGE 4X4 12PLY STRL (GAUZE/BANDAGES/DRESSINGS) ×3 IMPLANT
GLOVE BIO SURGEON STRL SZ7 (GLOVE) ×3 IMPLANT
GLOVE BIOGEL PI IND STRL 7.5 (GLOVE) ×1 IMPLANT
GLOVE BIOGEL PI INDICATOR 7.5 (GLOVE) ×2
GOWN STRL REUS W/ TWL LRG LVL3 (GOWN DISPOSABLE) ×3 IMPLANT
GOWN STRL REUS W/ TWL XL LVL3 (GOWN DISPOSABLE) IMPLANT
GOWN STRL REUS W/TWL LRG LVL3 (GOWN DISPOSABLE) ×12
GOWN STRL REUS W/TWL XL LVL3 (GOWN DISPOSABLE) ×3
HEMOSTAT SNOW SURGICEL 2X4 (HEMOSTASIS) ×2 IMPLANT
KIT BASIN OR (CUSTOM PROCEDURE TRAY) ×3 IMPLANT
KIT TURNOVER KIT B (KITS) ×3 IMPLANT
NS IRRIG 1000ML POUR BTL (IV SOLUTION) ×5 IMPLANT
PACK CV ACCESS (CUSTOM PROCEDURE TRAY) ×3 IMPLANT
PAD ARMBOARD 7.5X6 YLW CONV (MISCELLANEOUS) ×6 IMPLANT
PADDING CAST COTTON 6X4 STRL (CAST SUPPLIES) ×2 IMPLANT
PATCH VASC XENOSURE 1CMX6CM (Vascular Products) ×3 IMPLANT
PATCH VASC XENOSURE 1X6 (Vascular Products) IMPLANT
SUT ETHILON 3 0 PS 1 (SUTURE) ×2 IMPLANT
SUT MNCRL AB 4-0 PS2 18 (SUTURE) ×6 IMPLANT
SUT PROLENE 5 0 C 1 24 (SUTURE) ×4 IMPLANT
SUT PROLENE 6 0 BV (SUTURE) ×4 IMPLANT
SUT SILK 0 TIES 10X30 (SUTURE) ×3 IMPLANT
SUT VIC AB 3-0 SH 27 (SUTURE) ×3
SUT VIC AB 3-0 SH 27X BRD (SUTURE) ×1 IMPLANT
TOWEL GREEN STERILE (TOWEL DISPOSABLE) ×3 IMPLANT
UNDERPAD 30X30 (UNDERPADS AND DIAPERS) ×3 IMPLANT
WATER STERILE IRR 1000ML POUR (IV SOLUTION) ×3 IMPLANT

## 2018-12-31 NOTE — Op Note (Signed)
Patient name: Joseph RABBANI Sr. 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 Donzetta Matters, MD Assistant: Laurence Slate, PA Procedure Performed: 1.  Ligation left arm AV graft 2.  Endarterectomy and patch angioplasty of left brachial artery with bovine pericardial patch  Indications: 78 year old male with end-stage renal disease currently dialyzing via right IJ catheter.  1 month ago he had a left brachial artery to axillary vein graft placed.  He was seen in the office with pain in the left arm worse at night.  He has no palpable pulses at the wrist and is indicated for graft ligation.  Findings: The graft itself was heavily incorporated.  While mobilizing the brachial artery the graft evulsed given the amount of scar tissue.  The graft was ligated.  There was significant soft plaque within the brachial artery and this was endarterectomized with tourniquet in place.  We did open the incision 2 cm distally to identify healthy brachial artery and a bovine pericardial patch was used.  At completion he has a signal at the radial artery at the wrist.  Radial artery is nonpalpable likely secondary to forearm arterial disease.  There is good signal in the brachial artery distal to our patch.   Procedure:  The patient was identified in the holding area and taken to the operating room where is placed supine operative MAC anesthesia induced.  Sterilely prepped draped left upper extremity usual fashion antibiotics were administered timeout called.  Ultrasound was used to identify the graft to artery anastomosis.  The old incision was opened and was heavily scarred.  We dissected out the graft placed a vessel loop around this.  We then traced back to the artery.  I was able to get the artery out proximally distally was heavily scarred.  While attempting to cut through scar tissue did  transect part of the brachial artery.  At this time pressure was held and tourniquet was placed up at the level of the upper arm.  We dissected out and clamped the brachial artery proximally and then dissected distally.  Unfortunately the artery had been exarterectomized.  I extended the incision 2 cm.  Identified healthy brachial artery.  I clamped there.  We had let the tourniquet down for a few minutes we had clamps on we then placed Esmarch around the arm and we placed up the tourniquet.  The clamps were taken off.  I performed endarterectomy of the artery back to healthy-appearing tissue on both sides.  The artery was itself heavily calcified it was quite large and patulous.  This allowed for patch angioplasty.  We fashioned a bovine pericardial patch and sewed in place with 6-0 Prolene suture.  Prior to completion of our anastomosis we allowed flushing all directions.  We allowed our tourniquet down we had strong inflow.  We did have good backbleeding I did not think I needed to pass a Fogarty catheter.  I irrigated with heparinized saline clamped the arteries.  I completed the anastomosis.  There was a good signal in the wound brachial artery distal to our patch.  There is minimal signal at the radial artery at the wrist did augment with compression of the brachial artery.  Satisfied with this we obtain hemostasis and irrigated the wound thoroughly.  We closed in layers with Vicryl and Monocryl dermal was placed to level skin.  He was awakened anesthesia having tolerated  procedure without any complication.  EBL: 200 cc    Gracynn Rajewski C. Donzetta Matters, MD [facility] Office: [phone] Pager: [phone]

## 2018-12-31 NOTE — Anesthesia Postprocedure Evaluation (Signed)
Anesthesia Post Note  Patient: Joseph GLENDENING Sr.  Procedure(s) Performed: LIGATION OF LEFT UPPER ARM ARTERIVENOUS GORTEX GRAFT, LEFT BRACHIAL ARTERY ENDARTERECTOMY WITH BOVINE PATCH ANGIOPLASTY (Left Arm Upper)     Patient location during evaluation: PACU Anesthesia Type: MAC Level of consciousness: awake and alert Pain management: pain level controlled Vital Signs Assessment: post-procedure vital signs reviewed and stable Respiratory status: spontaneous breathing, nonlabored ventilation and respiratory function stable Cardiovascular status: stable and blood pressure returned to baseline Anesthetic complications: no    Last Vitals:  Vitals:   12/31/18 1400 12/31/18 1415  BP: (!) 99/52 114/61  Pulse: 81 80  Resp: (!) 23 15  Temp:  (!) 36.2 C  SpO2: 98% 95%    Last Pain:  Vitals:   12/31/18 1415  TempSrc:   PainSc: 4                  Audry Pili

## 2018-12-31 NOTE — Progress Notes (Signed)
Pt received from PACU. Pt vital stable. Belongings at bedside. CCMD notfied/telebox 25 applied. Pt oriented to room call bell in place. CHG bath given. Will continue to monitor.  Jerald Kief, RN

## 2018-12-31 NOTE — H&P (Signed)
   History and Physical Update  The patient was interviewed and re-examined.  The patient's previous History and Physical has been reviewed and is unchanged from recent office visit. Plan to ligate left arm avg.  Dalante Minus C. Donzetta Matters, MD [facility] Office: [phone] Pager: [phone]   12/31/2018, 9:41 AM

## 2018-12-31 NOTE — Progress Notes (Signed)
Dr Fransisco Beau made aware of patient's ISTAT results and potassium of 7.9  Will re-draw ISTAT per Dr. Fransisco Beau.  Patient on monitor, alert, oriented and no complaints.  Will continue to monitor patient.

## 2018-12-31 NOTE — Transfer of Care (Signed)
Immediate Anesthesia Transfer of Care Note  Patient: [name]  Procedure(s) Performed: LIGATION OF LEFT UPPER ARM ARTERIVENOUS GORTEX GRAFT, LEFT BRACHIAL ARTERY ENDARTERECTOMY WITH BOVINE PATCH ANGIOPLASTY (Left Arm Upper)  Patient Location: PACU  Anesthesia Type:MAC  Level of Consciousness: awake, alert , oriented and patient cooperative  Airway & Oxygen Therapy: Patient Spontanous Breathing and Patient connected to face mask oxygen  Post-op Assessment: Report given to RN and Post -op Vital signs reviewed and stable  Post vital signs: Reviewed and stable  Last Vitals:  Vitals Value Taken Time  BP 115/70 12/31/18 1215  Temp 36.3 C 12/31/18 1215  Pulse 81 12/31/18 1215  Resp 15 12/31/18 1215  SpO2 99 % 12/31/18 1215    Last Pain:  Vitals:   12/31/18 0835  TempSrc:   PainSc: 4       Patients Stated Pain Goal: 3 (29/56/21 3086)  Complications: No apparent anesthesia complications

## 2018-12-31 NOTE — Progress Notes (Signed)
Pharmacy contacted for med rec.  Unable to reach pharm tech.  Will continue to contact.

## 2019-01-01 DIAGNOSIS — Z992 Dependence on renal dialysis: Secondary | ICD-10-CM | POA: Diagnosis not present

## 2019-01-01 DIAGNOSIS — T82898A Other specified complication of vascular prosthetic devices, implants and grafts, initial encounter: Secondary | ICD-10-CM | POA: Diagnosis not present

## 2019-01-01 DIAGNOSIS — N186 End stage renal disease: Secondary | ICD-10-CM | POA: Diagnosis not present

## 2019-01-01 DIAGNOSIS — G5600 Carpal tunnel syndrome, unspecified upper limb: Secondary | ICD-10-CM | POA: Diagnosis not present

## 2019-01-01 DIAGNOSIS — I2510 Atherosclerotic heart disease of native coronary artery without angina pectoris: Secondary | ICD-10-CM | POA: Diagnosis not present

## 2019-01-01 DIAGNOSIS — I120 Hypertensive chronic kidney disease with stage 5 chronic kidney disease or end stage renal disease: Secondary | ICD-10-CM | POA: Diagnosis not present

## 2019-01-01 MED ORDER — HEPARIN SODIUM (PORCINE) 1000 UNIT/ML IJ SOLN
1.8000 mL | Freq: Once | INTRAMUSCULAR | Status: AC
  Administered 2019-01-01: 11:00:00 1800 [IU]

## 2019-01-01 MED ORDER — OXYCODONE-ACETAMINOPHEN 5-325 MG PO TABS: 1.0000 | ORAL_TABLET | ORAL | 0 refills | Status: DC | PRN

## 2019-01-01 NOTE — Discharge Instructions (Signed)
° °  [facility] ° °Discharge Instructions ° °AV Fistula or Graft Surgery for Dialysis Access ° °Please refer to the following instructions for your post-procedure care. Your surgeon or physician assistant will discuss any changes with you. ° °Activity ° °You may drive the day following your surgery, if you are comfortable and no longer taking prescription pain medication. Resume full activity as the soreness in your incision resolves. ° °Bathing/Showering ° °You may shower after you go home. Keep your incision dry for 48 hours. Do not soak in a bathtub, hot tub, or swim until the incision heals completely. You may not shower if you have a hemodialysis catheter. ° °Incision Care ° °Clean your incision with mild soap and water after 48 hours. Pat the area dry with a clean towel. You do not need a bandage unless otherwise instructed. Do not apply any ointments or creams to your incision. You may have skin glue on your incision. Do not peel it off. It will come off on its own in about one week. Your arm may swell a bit after surgery. To reduce swelling use pillows to elevate your arm so it is above your heart. Your doctor will tell you if you need to lightly wrap your arm with an ACE bandage. ° °Diet ° °Resume your normal diet. There are not special food restrictions following this procedure. In order to heal from your surgery, it is CRITICAL to get adequate nutrition. Your body requires vitamins, minerals, and protein. Vegetables are the best source of vitamins and minerals. Vegetables also provide the perfect balance of protein. Processed food has little nutritional value, so try to avoid this. ° °Medications ° °Resume taking all of your medications. If your incision is causing pain, you may take over-the counter pain relievers such as acetaminophen (Tylenol). If you were prescribed a stronger pain medication, please be aware these medications can cause nausea and constipation. Prevent  nausea by taking the medication with a snack or meal. Avoid constipation by drinking plenty of fluids and eating foods with high amount of fiber, such as fruits, vegetables, and grains. Do not take Tylenol if you are taking prescription pain medications. ° ° ° ° °Follow up °Your surgeon may want to see you in the office following your access surgery. If so, this will be arranged at the time of your surgery. ° °Please call us immediately for any of the following conditions: ° °Increased pain, redness, drainage (pus) from your incision site °Fever of 101 degrees or higher °Severe or worsening pain at your incision site °Hand pain or numbness. ° °Reduce your risk of vascular disease: ° °Stop smoking. If you would like help, call [facility] at 1-800-QUIT-NOW (1-800-784-8669) or [place] at 336-586-4000 ° °Manage your cholesterol °Maintain a desired weight °Control your diabetes °Keep your blood pressure down ° °Dialysis ° °It will take several weeks to several months for your new dialysis access to be ready for use. Your surgeon will determine when it is OK to use it. Your nephrologist will continue to direct your dialysis. You can continue to use your Permcath until your new access is ready for use. ° °If you have any questions, please call the office at [phone]. ° °

## 2019-01-01 NOTE — Discharge Summary (Signed)
Physician Discharge Summary   Patient ID: [name] [id number] 78 y.o. 01/22/1941  Admit date: 12/31/2018  Discharge date and time:  01/01/19  Admitting Physician: Waynetta Sandy, MD   Discharge Physician: same  Admission Diagnoses: STEAL SYNDROME LEFT HAND  Discharge Diagnoses: ESRD  Admission Condition: fair  Discharged Condition: fair  Indication for Admission: steal syndrome L hand  Hospital Course: [name] is a 78 year old male who was brought in as an outpatient with steal syndrome in left hand for ligation of left arm AV graft as well as endarterectomy and patch angioplasty of left brachial artery on 12/31/2018 by Dr. Donzetta Matters.  He tolerated this procedure well and was admitted to the hospital overnight.  POD #1 he is perfusing his left hand with grip strength intact.  He is feeling ready for discharge home this morning.  Plan will be to have hemodialysis via right [facility] tomorrow at outpatient center.  He will follow-up with Dr. Donzetta Matters in office in about 3 weeks.  He will be prescribed 1 to 2 days of narcotic pain medication for continued postoperative pain control.  Discharge instructions were reviewed with the patient and he voiced his understanding.  He will be discharged home this morning in stable condition.  Consults: None  Treatments: surgery: Ligation left arm AV graft and endarterectomy and patch angioplasty of left brachial artery with bovine patch by Dr. Donzetta Matters on 12/31/2018  Discharge Exam: See progress note 01/01/2019 Vitals:   12/31/18 2100 01/01/19 0342  BP: (!) 118/46 138/67  Pulse: 73 78  Resp: 14 16  Temp: (!) 97.5 F (36.4 C) 98.1 F (36.7 C)  SpO2: 94% 93%     Disposition: Discharge disposition: 01-Home or Self Care       Patient Instructions:  Allergies as of 01/01/2019      Reactions   Aspirin Other (See Comments)   Causes internal bleeding  History of ulcers      Medication List    TAKE these medications   atorvastatin 40  MG tablet Commonly known as: LIPITOR Take 40 mg by mouth daily.   b complex-vitamin c-folic acid 0.8 MG Tabs tablet Take 1 tablet by mouth See admin instructions. Take one tablet daily on Tuesdays, Thursdays, Saturdays, and Sundays.   cinacalcet 60 MG tablet Commonly known as: SENSIPAR Take 60 mg by mouth every evening.   feeding supplement (NEPRO CARB STEADY) Liqd Take 237 mLs by mouth 2 (two) times daily between meals.   midodrine 10 MG tablet Commonly known as: PROAMATINE Take 10 mg by mouth every Monday, Wednesday, and Friday. before dialysis.   nitroGLYCERIN 0.2 mg/hr patch Commonly known as: NITRODUR - Dosed in mg/24 hr Place 1 patch (0.2 mg total) onto the skin daily. Apply 1 patch to dorsum of foot daily.   oxyCODONE-acetaminophen 5-325 MG tablet Commonly known as: Percocet Take 1 tablet by mouth every 4 (four) hours as needed for severe pain. What changed: when to take this   sevelamer carbonate 800 MG tablet Commonly known as: RENVELA Take 1,600-3,200 mg by mouth See admin instructions. Take 3200  mg by mouth 3 times daily with full meals and take 1600 mg by mouth with snacks.      Activity: activity as tolerated Diet: renal diet Wound Care: keep wound clean and dry  Follow-up with Dr. Donzetta Matters in 3 weeks.  SignedDagoberto Ligas 01/01/2019 8:00 AM

## 2019-01-01 NOTE — Plan of Care (Signed)
All goals are adequate for discharge. Patient alert. No s/s of distress noted.

## 2019-01-01 NOTE — Progress Notes (Addendum)
  Progress Note    01/01/2019 7:22 AM 1 Day Post-Op  Subjective: ready for discharge home   Vitals:   12/31/18 2100 01/01/19 0342  BP: (!) 118/46 138/67  Pulse: 73 78  Resp: 14 16  Temp: (!) 97.5 F (36.4 C) 98.1 F (36.7 C)  SpO2: 94% 93%   Physical Exam: Lungs:  Non labored Incisions:  LUE incisions c/d/i Ext: grip strength L hand intact Neurologic: A&O  CBC    Component Value Date/Time   WBC 7.3 12/31/2018 1641   RBC 6.58 (H) 12/31/2018 1641   HGB 13.8 12/31/2018 1641   HCT 44.3 12/31/2018 1641   PLT 154 12/31/2018 1641   MCV 67.3 (L) 12/31/2018 1641   MCH 21.0 (L) 12/31/2018 1641   MCHC 31.2 12/31/2018 1641   RDW 19.0 (H) 12/31/2018 1641   LYMPHSABS 1.3 08/02/2018 0525   MONOABS 0.5 08/02/2018 0525   EOSABS 0.5 08/02/2018 0525   BASOSABS 0.1 08/02/2018 0525    BMET    Component Value Date/Time   NA 137 12/31/2018 0952   K 4.0 12/31/2018 0952   CL 98 12/02/2018 0827   CO2 25 12/02/2018 0827   GLUCOSE 93 12/31/2018 0952   BUN 43 (H) 12/02/2018 0827   CREATININE 8.08 (H) 12/31/2018 1641   CREATININE 10.80 (H) 07/06/2015 1158   CALCIUM 8.7 (L) 12/02/2018 0827   GFRNONAA 6 (L) 12/31/2018 1641   GFRAA 7 (L) 12/31/2018 1641    INR    Component Value Date/Time   INR 1.1 12/01/2018 1538     Intake/Output Summary (Last 24 hours) at 01/01/2019 5732 Last data filed at 12/31/2018 1625 Gross per 24 hour  Intake 252.75 ml  Output 425 ml  Net -172.25 ml     Assessment/Plan:  78 y.o. male is s/p ligation L arm AV graft; endarterectomy L brachial with bovine patch 1 Day Post-Op   Perfusing L hand with grip strength intact Ok to discharge home today Dialysis tomorrow via [facility]   Dagoberto Ligas, PA-C [facility] [phone] 01/01/2019 7:22 AM   I have independently interviewed and examined patient and agree with PA assessment and plan above.  Left hand is much warmer incisions clean dry intact.  Patient able to dialyze tomorrow  with [facility].  Will need to evaluate for further dialysis access in the near future.  Emmilyn Crooke C. Donzetta Matters, MD [facility] [place] Office: [phone] Pager: [phone]

## 2019-01-01 NOTE — Progress Notes (Signed)
Received consult to disconnect IV fluids from HD cath. Found patient with line attached from PACU. Gauze dressing in place. Dressing changed, HD cath venous lumen flushed with heparin, capped and clamped.

## 2019-01-02 DIAGNOSIS — N186 End stage renal disease: Secondary | ICD-10-CM | POA: Diagnosis not present

## 2019-01-02 DIAGNOSIS — N2581 Secondary hyperparathyroidism of renal origin: Secondary | ICD-10-CM | POA: Diagnosis not present

## 2019-01-02 DIAGNOSIS — D509 Iron deficiency anemia, unspecified: Secondary | ICD-10-CM | POA: Diagnosis not present

## 2019-01-02 DIAGNOSIS — T82898A Other specified complication of vascular prosthetic devices, implants and grafts, initial encounter: Secondary | ICD-10-CM | POA: Diagnosis not present

## 2019-01-02 DIAGNOSIS — Z992 Dependence on renal dialysis: Secondary | ICD-10-CM | POA: Diagnosis not present

## 2019-01-04 DIAGNOSIS — T82898A Other specified complication of vascular prosthetic devices, implants and grafts, initial encounter: Secondary | ICD-10-CM | POA: Diagnosis not present

## 2019-01-04 DIAGNOSIS — N186 End stage renal disease: Secondary | ICD-10-CM | POA: Diagnosis not present

## 2019-01-04 DIAGNOSIS — N2581 Secondary hyperparathyroidism of renal origin: Secondary | ICD-10-CM | POA: Diagnosis not present

## 2019-01-04 DIAGNOSIS — D509 Iron deficiency anemia, unspecified: Secondary | ICD-10-CM | POA: Diagnosis not present

## 2019-01-04 DIAGNOSIS — Z992 Dependence on renal dialysis: Secondary | ICD-10-CM | POA: Diagnosis not present

## 2019-01-05 ENCOUNTER — Encounter (HOSPITAL_COMMUNITY): Payer: Self-pay | Admitting: Vascular Surgery

## 2019-01-05 LAB — POCT I-STAT 4, (NA,K, GLUC, HGB,HCT)
Glucose, Bld: 86 mg/dL (ref 70–99)
HCT: 53 % — ABNORMAL HIGH (ref 39.0–52.0)
Hemoglobin: 18 g/dL — ABNORMAL HIGH (ref 13.0–17.0)
Potassium: 7.9 mmol/L (ref 3.5–5.1)
Sodium: 131 mmol/L — ABNORMAL LOW (ref 135–145)

## 2019-01-06 DIAGNOSIS — T82898A Other specified complication of vascular prosthetic devices, implants and grafts, initial encounter: Secondary | ICD-10-CM | POA: Diagnosis not present

## 2019-01-06 DIAGNOSIS — Z992 Dependence on renal dialysis: Secondary | ICD-10-CM | POA: Diagnosis not present

## 2019-01-06 DIAGNOSIS — N2581 Secondary hyperparathyroidism of renal origin: Secondary | ICD-10-CM | POA: Diagnosis not present

## 2019-01-06 DIAGNOSIS — D509 Iron deficiency anemia, unspecified: Secondary | ICD-10-CM | POA: Diagnosis not present

## 2019-01-06 DIAGNOSIS — N186 End stage renal disease: Secondary | ICD-10-CM | POA: Diagnosis not present

## 2019-01-08 DIAGNOSIS — N186 End stage renal disease: Secondary | ICD-10-CM | POA: Diagnosis not present

## 2019-01-08 DIAGNOSIS — Z992 Dependence on renal dialysis: Secondary | ICD-10-CM | POA: Diagnosis not present

## 2019-01-08 DIAGNOSIS — N2581 Secondary hyperparathyroidism of renal origin: Secondary | ICD-10-CM | POA: Diagnosis not present

## 2019-01-08 DIAGNOSIS — T82898A Other specified complication of vascular prosthetic devices, implants and grafts, initial encounter: Secondary | ICD-10-CM | POA: Diagnosis not present

## 2019-01-08 DIAGNOSIS — D509 Iron deficiency anemia, unspecified: Secondary | ICD-10-CM | POA: Diagnosis not present

## 2019-01-11 DIAGNOSIS — N186 End stage renal disease: Secondary | ICD-10-CM | POA: Diagnosis not present

## 2019-01-11 DIAGNOSIS — N2581 Secondary hyperparathyroidism of renal origin: Secondary | ICD-10-CM | POA: Diagnosis not present

## 2019-01-11 DIAGNOSIS — Z992 Dependence on renal dialysis: Secondary | ICD-10-CM | POA: Diagnosis not present

## 2019-01-11 DIAGNOSIS — D509 Iron deficiency anemia, unspecified: Secondary | ICD-10-CM | POA: Diagnosis not present

## 2019-01-11 DIAGNOSIS — T82898A Other specified complication of vascular prosthetic devices, implants and grafts, initial encounter: Secondary | ICD-10-CM | POA: Diagnosis not present

## 2019-01-13 DIAGNOSIS — T82898A Other specified complication of vascular prosthetic devices, implants and grafts, initial encounter: Secondary | ICD-10-CM | POA: Diagnosis not present

## 2019-01-13 DIAGNOSIS — Z992 Dependence on renal dialysis: Secondary | ICD-10-CM | POA: Diagnosis not present

## 2019-01-13 DIAGNOSIS — N2581 Secondary hyperparathyroidism of renal origin: Secondary | ICD-10-CM | POA: Diagnosis not present

## 2019-01-13 DIAGNOSIS — D509 Iron deficiency anemia, unspecified: Secondary | ICD-10-CM | POA: Diagnosis not present

## 2019-01-13 DIAGNOSIS — N186 End stage renal disease: Secondary | ICD-10-CM | POA: Diagnosis not present

## 2019-01-14 DIAGNOSIS — I951 Orthostatic hypotension: Secondary | ICD-10-CM | POA: Diagnosis not present

## 2019-01-14 DIAGNOSIS — N186 End stage renal disease: Secondary | ICD-10-CM | POA: Diagnosis not present

## 2019-01-14 DIAGNOSIS — I5022 Chronic systolic (congestive) heart failure: Secondary | ICD-10-CM | POA: Diagnosis not present

## 2019-01-14 DIAGNOSIS — Z23 Encounter for immunization: Secondary | ICD-10-CM | POA: Diagnosis not present

## 2019-01-14 DIAGNOSIS — Z1331 Encounter for screening for depression: Secondary | ICD-10-CM | POA: Diagnosis not present

## 2019-01-14 DIAGNOSIS — Z1389 Encounter for screening for other disorder: Secondary | ICD-10-CM | POA: Diagnosis not present

## 2019-01-14 DIAGNOSIS — I2510 Atherosclerotic heart disease of native coronary artery without angina pectoris: Secondary | ICD-10-CM | POA: Diagnosis not present

## 2019-01-15 DIAGNOSIS — D509 Iron deficiency anemia, unspecified: Secondary | ICD-10-CM | POA: Diagnosis not present

## 2019-01-15 DIAGNOSIS — N2581 Secondary hyperparathyroidism of renal origin: Secondary | ICD-10-CM | POA: Diagnosis not present

## 2019-01-15 DIAGNOSIS — Z992 Dependence on renal dialysis: Secondary | ICD-10-CM | POA: Diagnosis not present

## 2019-01-15 DIAGNOSIS — T82898A Other specified complication of vascular prosthetic devices, implants and grafts, initial encounter: Secondary | ICD-10-CM | POA: Diagnosis not present

## 2019-01-15 DIAGNOSIS — N186 End stage renal disease: Secondary | ICD-10-CM | POA: Diagnosis not present

## 2019-01-18 ENCOUNTER — Ambulatory Visit: Payer: Medicare Other | Admitting: Orthopedic Surgery

## 2019-01-18 DIAGNOSIS — D509 Iron deficiency anemia, unspecified: Secondary | ICD-10-CM | POA: Diagnosis not present

## 2019-01-18 DIAGNOSIS — N186 End stage renal disease: Secondary | ICD-10-CM | POA: Diagnosis not present

## 2019-01-18 DIAGNOSIS — Z992 Dependence on renal dialysis: Secondary | ICD-10-CM | POA: Diagnosis not present

## 2019-01-18 DIAGNOSIS — T82898A Other specified complication of vascular prosthetic devices, implants and grafts, initial encounter: Secondary | ICD-10-CM | POA: Diagnosis not present

## 2019-01-18 DIAGNOSIS — N2581 Secondary hyperparathyroidism of renal origin: Secondary | ICD-10-CM | POA: Diagnosis not present

## 2019-01-19 ENCOUNTER — Encounter: Payer: Self-pay | Admitting: Orthopedic Surgery

## 2019-01-19 ENCOUNTER — Ambulatory Visit (INDEPENDENT_AMBULATORY_CARE_PROVIDER_SITE_OTHER): Payer: Medicare Other | Admitting: Orthopedic Surgery

## 2019-01-19 ENCOUNTER — Other Ambulatory Visit: Payer: Self-pay

## 2019-01-19 VITALS — Ht 65.0 in | Wt 159.0 lb

## 2019-01-19 DIAGNOSIS — L97411 Non-pressure chronic ulcer of right heel and midfoot limited to breakdown of skin: Secondary | ICD-10-CM

## 2019-01-19 DIAGNOSIS — Z89431 Acquired absence of right foot: Secondary | ICD-10-CM

## 2019-01-19 DIAGNOSIS — I779 Disorder of arteries and arterioles, unspecified: Secondary | ICD-10-CM

## 2019-01-19 NOTE — Progress Notes (Signed)
Office Visit Note   Patient: Joseph VILARDI Sr.           Date of Birth: 1940/11/03           MRN: [id number] Visit Date: 01/19/2019              Requested by: [name], MD [address] [place],  [place] [address] PCP: [name], MD  Chief Complaint  Patient presents with  . Right Foot - Follow-up      HPI: Patient is a 78 year old gentleman who is status post a right transmetatarsal amputation he has a large plantar ulcer.  Patient states he is recently been hospitalized to take down and revise his vascular access for dialysis.  Assessment & Plan: Visit Diagnoses:  1. Midfoot skin ulcer, right, limited to breakdown of skin ([place])   2. S/P transmetatarsal amputation of foot, right ([place])     Plan: Patient is given a prescription for biotech for extra-depth shoes orthotics spacer carbon plate for the right foot.  Patient may require double upright braces.  Follow-Up Instructions: Return in about 4 weeks (around 02/16/2019).   Ortho Exam  Patient is alert, oriented, no adenopathy, well-dressed, normal affect, normal respiratory effort. Examination patient's foot is plantigrade there is no redness no cellulitis no signs of infection.  He has a large plantar ulcer.  After informed consent a 10 blade knife was used to bring the skin and soft tissue back to healthy viable tissue the wound is 4 x 6 cm and 5 mm deep silver nitrate was used for hemostasis.  Imaging: No results found. No images are attached to the encounter.  Labs: Lab Results  Component Value Date   REPTSTATUS 06/17/2018 FINAL 06/12/2018   GRAMSTAIN  02/18/2017    RARE WBC PRESENT, PREDOMINANTLY PMN FEW GRAM POSITIVE COCCI    CULT  06/12/2018    NO GROWTH 5 DAYS Performed at [facility], [address], [place], [place] [address]      Lab Results  Component Value Date   ALBUMIN 2.8 (L) 12/02/2018   ALBUMIN 3.0 (L) 12/01/2018   ALBUMIN 1.9 (L) 06/16/2018    Lab Results  Component  Value Date   MG 2.0 06/17/2018   MG 1.9 06/14/2018   MG 1.8 06/13/2018   No results found for: VD25OH  No results found for: PREALBUMIN CBC EXTENDED Latest Ref Rng & Units 12/31/2018 12/31/2018 12/31/2018  WBC 4.0 - 10.5 K/uL 7.3 - -  RBC 4.22 - 5.81 MIL/uL 6.58(H) - -  HGB 13.0 - 17.0 g/dL 13.8 17.0 18.0(H)  HCT 39.0 - 52.0 % 44.3 50.0 53.0(H)  PLT 150 - 400 K/uL 154 - -  NEUTROABS 1.7 - 7.7 K/uL - - -  LYMPHSABS 0.7 - 4.0 K/uL - - -     Body mass index is 26.46 kg/m.  Orders:  No orders of the defined types were placed in this encounter.  No orders of the defined types were placed in this encounter.    Procedures: No procedures performed  Clinical Data: No additional findings.  ROS:  All other systems negative, except as noted in the HPI. Review of Systems  Objective: Vital Signs: Ht 5\' 5"  (1.651 m)   Wt 159 lb (72.1 kg)   BMI 26.46 kg/m   Specialty Comments:  No specialty comments available.  PMFS History: Patient Active Problem List   Diagnosis Date Noted  . Malnutrition of moderate degree 06/18/2018  . Severe sepsis ([place]) 06/12/2018  .  Leukocytosis 06/12/2018  . MRSA (methicillin resistant staph aureus) culture positive 06/12/2018  . Essential hypertension 06/12/2018  . ESRD (end stage renal disease) ([place]) 06/09/2018  . Gangrene of finger ([place]) 06/04/2018  . S/P transmetatarsal amputation of foot, right ([place]) 05/09/2017  . PVD (peripheral vascular disease) ([place]) 04/15/2017  . Nonischemic cardiomyopathy ([place]) 08/15/2015  . Aortic stenosis 08/15/2015  . Moderate aortic stenosis 07/12/2015  . 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 CAD- 40-50% LAD at cath 07/11/15 07/11/2015  . Aspirin intolerance 07/11/2015  . Abnormal stress test   . ESRD on dialysis ([place]) 05/30/2015  . CTS (carpal tunnel syndrome) 02/28/2015  . PVD of LE - Dr Oneida Alar follows 08/20/2012  . Midfoot skin ulcer, right, limited to breakdown of skin ([place]) 01/16/2012  .  Encounter for screening colonoscopy 10/08/2011  . Melena 10/08/2011  . Other complications due to renal dialysis device, implant, and graft 09/24/2011   Past Medical History:  Diagnosis Date  . Anxiety   . Aortic stenosis   . Arthritis   . CVA (cerebral infarction)   . ESRD (end stage renal disease) on dialysis [facility])    M/W/F at [facility] in [place]  . Essential hypertension    resolved with HD  . Gangrene of right foot ([place])   . Gastric ulcer 2004  . GI bleed    gastric ulcer  . Heart murmur   . History of blood transfusion   . History of cardiomyopathy    LVEF normal as of February 2017  . History of gastric ulcer   . History of stroke    Left side weakness  . Iron deficiency anemia   . Stroke [facility])    limp left    Family History  Problem Relation Age of Onset  . Hypertension Mother   . Colon cancer Neg Hx   . Liver disease Neg Hx     Past Surgical History:  Procedure Laterality Date  . ABDOMINAL AORTAGRAM N/A 01/24/2012   Procedure: ABDOMINAL Maxcine Ham;  Surgeon: Elam Dutch, MD;  Location: [facility] CATH LAB;  Service: Cardiovascular;  Laterality: N/A;  . ABDOMINAL AORTOGRAM W/LOWER EXTREMITY N/A 04/15/2017   Procedure: ABDOMINAL AORTOGRAM W/LOWER EXTREMITY;  Surgeon: Angelia Mould, MD;  Location: [place] CV LAB;  Service: Cardiovascular;  Laterality: N/A;  . AMPUTATION Right 05/09/2017   Procedure: RIGHT TRANSMETATARSAL AMPUTATION;  Surgeon: [name], MD;  Location: [place];  Service: Orthopedics;  Laterality: Right;  . AMPUTATION Right 06/13/2018   Procedure: AMPUTATION RIGHT LONG FINGER;  Surgeon: Dayna Barker, MD;  Location: [place];  Service: Plastics;  Laterality: Right;  . ARTERIOVENOUS GRAFT PLACEMENT Right right arm  . AV FISTULA PLACEMENT Left 08/31/2015   Procedure: ARTERIOVENOUS (AV) FISTULA CREATION- LEFT RADIOCEPHALIC;  Surgeon: Mal Misty, MD;  Location: [place];  Service: Vascular;  Laterality: Left;  . AV FISTULA PLACEMENT Right  02/18/2017   Procedure: INSERTION OF ARTERIOVENOUS (AV) GORE-TEX GRAFT  RIGHT UPPER ARM;  Surgeon: Conrad Lane, MD;  Location: [place];  Service: Vascular;  Laterality: Right;  . AV FISTULA PLACEMENT Left 12/01/2018   Procedure: INSERTION OF ARTERIOVENOUS (AV) GORE-TEX GRAFT ARM;  Surgeon: Angelia Mould, MD;  Location: [place];  Service: Vascular;  Laterality: Left;  . BASCILIC VEIN TRANSPOSITION Left 12/19/2015   Procedure: FIRST STAGE BRACHIAL VEIN TRANSPOSITION;  Surgeon: Conrad [place], MD;  Location: [place];  Service: Vascular;  Laterality: Left;  . BASCILIC VEIN TRANSPOSITION Left 02/08/2016   Procedure:  SECOND STAGE BRACHIAL VEIN TRANSPOSITION;  Surgeon: Conrad Lemont, MD;  Location: [place];  Service: Vascular;  Laterality: Left;  . CARDIAC CATHETERIZATION N/A 07/11/2015   Procedure: Left Heart Cath and Coronary Angiography;  Surgeon: Troy Sine, MD;  Location: [place] CV LAB;  Service: Cardiovascular;  Laterality: N/A;  . Carpel Tunnel Left Dec. 22, 2016  . CHOLECYSTECTOMY    . COLONOSCOPY  2004   Dr. Irving Shows, left sided diverticula and cecal polyp, path unknown  . COLONOSCOPY  10/29/2011   Procedure: COLONOSCOPY;  Surgeon: Daneil Dolin, MD;  [facility];  Service: Endoscopy;  Laterality: N/A;  10:15  . ESOPHAGOGASTRODUODENOSCOPY  11/2002   Dr. Gala Romney, erosive reflux esophagitis, multiple gastric ulcer and antral/bulbar erosions. Serologies positive for H.Pylori and was treated  . ESOPHAGOGASTRODUODENOSCOPY  11/20014   Dr. Gala Romney, small hh only, ulcers healed  . ESOPHAGOGASTRODUODENOSCOPY  09/21/2011   Dr Trevor Iha [facility], antral erosions, ?early GAVE  . FISTULOGRAM Left 12/10/2016   Procedure: THROMBECTOMY OF LEFT ARM ARTERIOVENOUS FISTULA;  Surgeon: Waynetta Sandy, MD;  Location: [place];  Service: Vascular;  Laterality: Left;  . INSERTION OF DIALYSIS CATHETER Left 12/10/2016   Procedure: INSERTION OF TUNNELED DIALYSIS CATHETER;  Surgeon: Waynetta Sandy, MD;  Location: [place];  Service: Vascular;  Laterality: Left;  . INSERTION OF DIALYSIS CATHETER Right 06/09/2018   Procedure: INSERTION OF DIALYSIS CATHETER Right subclavian;  Surgeon: Angelia Mould, MD;  Location: [place];  Service: Vascular;  Laterality: Right;  . IR DIALY SHUNT INTRO [place] W/IMG RIGHT Right 01/01/2018  . IR GENERIC HISTORICAL  07/16/2016   IR REMOVAL TUN CV CATH W/O FL 07/16/2016 Saverio Danker, PA-C [facility]-INTERV RAD  . IR PTA ADDL CENTRAL DIALYSIS SEG THRU DIALY CIRCUIT RIGHT Right 10/21/2017  . IR REMOVAL TUN CV CATH W/O FL  05/12/2017  . IR THROMBECTOMY AV FISTULA W/THROMBOLYSIS/PTA INC/SHUNT/IMG RIGHT Right 10/21/2017  . IR THROMBECTOMY AV FISTULA W/THROMBOLYSIS/PTA INC/SHUNT/IMG RIGHT Right 11/27/2017  . IR US GUIDE VASC ACCESS RIGHT  10/21/2017  . IR US GUIDE VASC ACCESS RIGHT  11/27/2017  . IR US GUIDE VASC ACCESS RIGHT  01/01/2018  . LIGATION ARTERIOVENOUS GORTEX GRAFT Right 06/09/2018  . LIGATION ARTERIOVENOUS GORTEX GRAFT Right 06/09/2018   Procedure: LIGATION ARTERIOVENOUS GORTEX GRAFT RIGHT ARM;  Surgeon: Angelia Mould, MD;  Location: [place];  Service: Vascular;  Laterality: Right;  . LIGATION ARTERIOVENOUS GORTEX GRAFT Left 12/31/2018   Procedure: LIGATION OF LEFT UPPER ARM ARTERIVENOUS GORTEX GRAFT, LEFT BRACHIAL ARTERY ENDARTERECTOMY WITH BOVINE PATCH ANGIOPLASTY;  Surgeon: Waynetta Sandy, MD;  Location: [place];  Service: Vascular;  Laterality: Left;  . LIGATION OF ARTERIOVENOUS  FISTULA Left 12/19/2015   Procedure: LIGATION OF RADIOCEPHALIC ARTERIOVENOUS  FISTULA;  Surgeon: Conrad [place], MD;  Location: [place];  Service: Vascular;  Laterality: Left;  Marland Kitchen MASS EXCISION Right 02/18/2017   Procedure: EXCISION OF RIGHT AXILLARY EPIDERMAL INCLUSION CYST;  Surgeon: Conrad Hartington, MD;  Location: [place];  Service: Vascular;  Laterality: Right;  . PERIPHERAL VASCULAR CATHETERIZATION N/A 12/14/2015   Procedure: Fistulagram;  Surgeon: Conrad Algood, MD;  Location: [place] CV LAB;  Service: Cardiovascular;  Laterality: N/A;  . SHOULDER SURGERY Right    fracture  . ULTRASOUND GUIDANCE FOR VASCULAR ACCESS  04/15/2017   Procedure: Ultrasound Guidance For Vascular Access;  Surgeon: Angelia Mould, MD;  Location: [place] CV LAB;  Service: Cardiovascular;;  . UPPER EXTREMITY VENOGRAPHY Bilateral  12/17/2016   Procedure: Bilateral Upper Extremity Venography;  Surgeon: Serafina Mitchell, MD;  Location: [place] CV LAB;  Service: Cardiovascular;  Laterality: Bilateral;  . UPPER EXTREMITY VENOGRAPHY Left 11/03/2018   Procedure: UPPER EXTREMITY VENOGRAPHY;  Surgeon: Serafina Mitchell, MD;  Location: [place] CV LAB;  Service: Cardiovascular;  Laterality: Left;   Social History   Occupational History  . Occupation: retired, Licensed conveyancer    Employer: RETIRED  Tobacco Use  . Smoking status: Former Smoker    Years: 25.00    Quit date: 04/29/2004    Years since quitting: 14.7  . Smokeless tobacco: Former Systems developer    Types: Chew    Quit date: 01/16/1987  . Tobacco comment: quit 2006  Substance and Sexual Activity  . Alcohol use: No  . Drug use: No  . Sexual activity: Not on file

## 2019-01-20 DIAGNOSIS — N186 End stage renal disease: Secondary | ICD-10-CM | POA: Diagnosis not present

## 2019-01-20 DIAGNOSIS — T82898A Other specified complication of vascular prosthetic devices, implants and grafts, initial encounter: Secondary | ICD-10-CM | POA: Diagnosis not present

## 2019-01-20 DIAGNOSIS — N2581 Secondary hyperparathyroidism of renal origin: Secondary | ICD-10-CM | POA: Diagnosis not present

## 2019-01-20 DIAGNOSIS — Z992 Dependence on renal dialysis: Secondary | ICD-10-CM | POA: Diagnosis not present

## 2019-01-20 DIAGNOSIS — D509 Iron deficiency anemia, unspecified: Secondary | ICD-10-CM | POA: Diagnosis not present

## 2019-01-21 ENCOUNTER — Other Ambulatory Visit: Payer: Self-pay

## 2019-01-21 NOTE — Progress Notes (Signed)
Cardiology Office Note  Date: 01/22/2019   ID: Joseph SMAIL Sr., DOB 05/30/1940, MRN [id number]  PCP:  [name], MD  Cardiologist:  Rozann Lesches, MD Electrophysiologist:  None   Chief Complaint  Patient presents with  . Cardiac follow-up    History of Present Illness: [name]. is a 78 y.o. male last assessed via telehealth encounter in July for evaluation of intermittent lightheadedness.  He was referred for an echocardiogram at that time, overdue for reassessment of aortic stenosis, and also wore a cardiac monitor.  August demonstrated normal LVEF at 55 to 83% with diastolic dysfunction, normal right ventricular contraction, severe left atrial enlargement, moderate to severe aortic stenosis with reported mean gradient of 38 mmHg and dimensionless index 0.28.  This had progressed in comparison to prior evaluation in 2017.  Cardiac monitor showed sinus rhythm with atrial and ventricular ectopy including brief episodes of PSVT and a single episode of NSVT.  Importantly there were no sustained arrhythmias.  I reviewed interval records, he was just recently hospitalized in early September with steal syndrome affecting the left hand status post ligation of the left arm AV graft as well as endarterectomy and patch angioplasty of the left brachial artery per Dr. Donzetta Matters.  Right IJ TDC placed for hemodialysis.  He presents today reporting no worsening feelings of intermittent lightheadedness, no frank syncope, no chest pain or palpitations.  I talked with him about the results of his echocardiogram and will plan to follow this over the next 6 months.  He is not a good surgical candidate, but may be a candidate for TAVR ultimately.  Past Medical History:  Diagnosis Date  . Anxiety   . Aortic stenosis   . Arthritis   . CVA (cerebral infarction)   . ESRD (end stage renal disease) on dialysis [facility])    M/W/F at [facility] in [place]  . Essential hypertension    resolved with HD   . Gangrene of right foot ([place])   . Gastric ulcer 2004  . GI bleed    gastric ulcer  . Heart murmur   . History of blood transfusion   . History of cardiomyopathy    LVEF normal as of February 2017  . History of gastric ulcer   . History of stroke    Left side weakness  . Iron deficiency anemia   . Stroke [facility])    limp left    Past Surgical History:  Procedure Laterality Date  . ABDOMINAL AORTAGRAM N/A 01/24/2012   Procedure: ABDOMINAL Maxcine Ham;  Surgeon: Elam Dutch, MD;  Location: [facility] CATH LAB;  Service: Cardiovascular;  Laterality: N/A;  . ABDOMINAL AORTOGRAM W/LOWER EXTREMITY N/A 04/15/2017   Procedure: ABDOMINAL AORTOGRAM W/LOWER EXTREMITY;  Surgeon: Angelia Mould, MD;  Location: [place] CV LAB;  Service: Cardiovascular;  Laterality: N/A;  . AMPUTATION Right 05/09/2017   Procedure: RIGHT TRANSMETATARSAL AMPUTATION;  Surgeon: [name], MD;  Location: [place];  Service: Orthopedics;  Laterality: Right;  . AMPUTATION Right 06/13/2018   Procedure: AMPUTATION RIGHT LONG FINGER;  Surgeon: Dayna Barker, MD;  Location: [place];  Service: Plastics;  Laterality: Right;  . ARTERIOVENOUS GRAFT PLACEMENT Right right arm  . AV FISTULA PLACEMENT Left 08/31/2015   Procedure: ARTERIOVENOUS (AV) FISTULA CREATION- LEFT RADIOCEPHALIC;  Surgeon: Mal Misty, MD;  Location: [place];  Service: Vascular;  Laterality: Left;  . AV FISTULA PLACEMENT Right 02/18/2017   Procedure: INSERTION OF ARTERIOVENOUS (AV) GORE-TEX GRAFT  RIGHT UPPER  ARM;  Surgeon: Conrad Carrollton, MD;  Location: [place];  Service: Vascular;  Laterality: Right;  . AV FISTULA PLACEMENT Left 12/01/2018   Procedure: INSERTION OF ARTERIOVENOUS (AV) GORE-TEX GRAFT ARM;  Surgeon: Angelia Mould, MD;  Location: [place];  Service: Vascular;  Laterality: Left;  . BASCILIC VEIN TRANSPOSITION Left 12/19/2015   Procedure: FIRST STAGE BRACHIAL VEIN TRANSPOSITION;  Surgeon: Conrad Davenport, MD;  Location: [place];  Service: Vascular;   Laterality: Left;  . BASCILIC VEIN TRANSPOSITION Left 02/08/2016   Procedure: SECOND STAGE BRACHIAL VEIN TRANSPOSITION;  Surgeon: Conrad Overton, MD;  Location: [place];  Service: Vascular;  Laterality: Left;  . CARDIAC CATHETERIZATION N/A 07/11/2015   Procedure: Left Heart Cath and Coronary Angiography;  Surgeon: Troy Sine, MD;  Location: [place] CV LAB;  Service: Cardiovascular;  Laterality: N/A;  . Carpel Tunnel Left Dec. 22, 2016  . CHOLECYSTECTOMY    . COLONOSCOPY  2004   Dr. Irving Shows, left sided diverticula and cecal polyp, path unknown  . COLONOSCOPY  10/29/2011   Procedure: COLONOSCOPY;  Surgeon: Daneil Dolin, MD;  [facility];  Service: Endoscopy;  Laterality: N/A;  10:15  . ESOPHAGOGASTRODUODENOSCOPY  11/2002   Dr. Gala Romney, erosive reflux esophagitis, multiple gastric ulcer and antral/bulbar erosions. Serologies positive for H.Pylori and was treated  . ESOPHAGOGASTRODUODENOSCOPY  11/20014   Dr. Gala Romney, small hh only, ulcers healed  . ESOPHAGOGASTRODUODENOSCOPY  09/21/2011   Dr Trevor Iha [facility], antral erosions, ?early GAVE  . FISTULOGRAM Left 12/10/2016   Procedure: THROMBECTOMY OF LEFT ARM ARTERIOVENOUS FISTULA;  Surgeon: Waynetta Sandy, MD;  Location: [place];  Service: Vascular;  Laterality: Left;  . INSERTION OF DIALYSIS CATHETER Left 12/10/2016   Procedure: INSERTION OF TUNNELED DIALYSIS CATHETER;  Surgeon: Waynetta Sandy, MD;  Location: [place];  Service: Vascular;  Laterality: Left;  . INSERTION OF DIALYSIS CATHETER Right 06/09/2018   Procedure: INSERTION OF DIALYSIS CATHETER Right subclavian;  Surgeon: Angelia Mould, MD;  Location: [place];  Service: Vascular;  Laterality: Right;  . IR DIALY SHUNT INTRO [place] W/IMG RIGHT Right 01/01/2018  . IR GENERIC HISTORICAL  07/16/2016   IR REMOVAL TUN CV CATH W/O FL 07/16/2016 Saverio Danker, PA-C [facility]-INTERV RAD  . IR PTA ADDL CENTRAL DIALYSIS SEG THRU DIALY CIRCUIT RIGHT Right 10/21/2017   . IR REMOVAL TUN CV CATH W/O FL  05/12/2017  . IR THROMBECTOMY AV FISTULA W/THROMBOLYSIS/PTA INC/SHUNT/IMG RIGHT Right 10/21/2017  . IR THROMBECTOMY AV FISTULA W/THROMBOLYSIS/PTA INC/SHUNT/IMG RIGHT Right 11/27/2017  . IR US GUIDE VASC ACCESS RIGHT  10/21/2017  . IR US GUIDE VASC ACCESS RIGHT  11/27/2017  . IR US GUIDE VASC ACCESS RIGHT  01/01/2018  . LIGATION ARTERIOVENOUS GORTEX GRAFT Right 06/09/2018  . LIGATION ARTERIOVENOUS GORTEX GRAFT Right 06/09/2018   Procedure: LIGATION ARTERIOVENOUS GORTEX GRAFT RIGHT ARM;  Surgeon: Angelia Mould, MD;  Location: [place];  Service: Vascular;  Laterality: Right;  . LIGATION ARTERIOVENOUS GORTEX GRAFT Left 12/31/2018   Procedure: LIGATION OF LEFT UPPER ARM ARTERIVENOUS GORTEX GRAFT, LEFT BRACHIAL ARTERY ENDARTERECTOMY WITH BOVINE PATCH ANGIOPLASTY;  Surgeon: Waynetta Sandy, MD;  Location: [place];  Service: Vascular;  Laterality: Left;  . LIGATION OF ARTERIOVENOUS  FISTULA Left 12/19/2015   Procedure: LIGATION OF RADIOCEPHALIC ARTERIOVENOUS  FISTULA;  Surgeon: Conrad , MD;  Location: [place];  Service: Vascular;  Laterality: Left;  Marland Kitchen MASS EXCISION Right 02/18/2017   Procedure: EXCISION OF RIGHT AXILLARY EPIDERMAL INCLUSION CYST;  Surgeon: Conrad Abram, MD;  Location: [place];  Service: Vascular;  Laterality: Right;  . PERIPHERAL VASCULAR CATHETERIZATION N/A 12/14/2015   Procedure: Fistulagram;  Surgeon: Conrad Opdyke, MD;  Location: [place] CV LAB;  Service: Cardiovascular;  Laterality: N/A;  . SHOULDER SURGERY Right    fracture  . ULTRASOUND GUIDANCE FOR VASCULAR ACCESS  04/15/2017   Procedure: Ultrasound Guidance For Vascular Access;  Surgeon: Angelia Mould, MD;  Location: [place] CV LAB;  Service: Cardiovascular;;  . UPPER EXTREMITY VENOGRAPHY Bilateral 12/17/2016   Procedure: Bilateral Upper Extremity Venography;  Surgeon: Serafina Mitchell, MD;  Location: [place] CV LAB;  Service: Cardiovascular;  Laterality: Bilateral;  .  UPPER EXTREMITY VENOGRAPHY Left 11/03/2018   Procedure: UPPER EXTREMITY VENOGRAPHY;  Surgeon: Serafina Mitchell, MD;  Location: [place] CV LAB;  Service: Cardiovascular;  Laterality: Left;    Current Outpatient Medications  Medication Sig Dispense Refill  . atorvastatin (LIPITOR) 40 MG tablet Take 40 mg by mouth daily.  3  . b complex-vitamin c-folic acid (NEPHRO-VITE) 0.8 MG TABS Take 1 tablet by mouth See admin instructions. Take one tablet daily on Tuesdays, Thursdays, Saturdays, and Sundays.    . cinacalcet (SENSIPAR) 60 MG tablet Take 60 mg by mouth every evening.     . midodrine (PROAMATINE) 10 MG tablet Take 10 mg by mouth every Monday, Wednesday, and Friday. before dialysis.    Marland Kitchen nitroGLYCERIN (NITRODUR - DOSED IN MG/24 HR) 0.2 mg/hr patch Place 1 patch (0.2 mg total) onto the skin daily. Apply 1 patch to dorsum of foot daily. 30 patch 11  . oxyCODONE-acetaminophen (PERCOCET) 5-325 MG tablet Take 1 tablet by mouth every 4 (four) hours as needed for severe pain. 15 tablet 0  . sevelamer (RENVELA) 800 MG tablet Take 1,600-3,200 mg by mouth See admin instructions. Take 3200  mg by mouth 3 times daily with full meals and take 1600 mg by mouth with snacks.     No current facility-administered medications for this visit.    Allergies:  Aspirin   Social History: The patient  reports that he quit smoking about 14 years ago. He quit after 25.00 years of use. He quit smokeless tobacco use about 32 years ago.  His smokeless tobacco use included chew. He reports that he does not drink alcohol or use drugs.   ROS:  Please see the history of present illness. Otherwise, complete review of systems is positive for hearing loss.  All other systems are reviewed and negative.   Physical Exam: VS:  BP 122/65   Pulse 79   Temp (!) 97.1 F (36.2 C)   Ht 5\' 5"  (1.651 m)   Wt 159 lb (72.1 kg)   BMI 26.46 kg/m , BMI Body mass index is 26.46 kg/m.  Wt Readings from Last 3 Encounters:  01/22/19 159 lb  (72.1 kg)  01/19/19 159 lb (72.1 kg)  12/31/18 159 lb (72.1 kg)    General: Elderly male, appears comfortable at rest. HEENT: Conjunctiva and lids normal, wearing a mask. Neck: Supple, no elevated JVP or carotid bruits, no thyromegaly. Thorax: Right-sided dialysis catheter dressed. Lungs: Decreased breath sounds without wheezing, nonlabored breathing at rest. Cardiac: Regular rate and rhythm, no S3, 3/6 systolic murmur, no pericardial rub. Abdomen: Soft, nontender, bowel sounds present. Extremities: No pitting edema, distal pulses 1-2+. Skin: Warm and dry. Musculoskeletal: No kyphosis. Neuropsychiatric: Alert and oriented x3, affect grossly appropriate.  ECG:  An ECG dated 08/02/2018 was personally reviewed today and  demonstrated:  Sinus tachycardia with right bundle branch block and PVCs.  Recent Labwork: 06/17/2018: Magnesium 2.0 12/01/2018: ALT 11; AST 17 12/02/2018: BUN 43 12/31/2018: Creatinine, Ser 8.08; Hemoglobin 13.8; Platelets 154; Potassium 4.0; Sodium 137   Other Studies Reviewed Today:  Cardiac monitor 12/23/2018: ZIO Patch monitor reviewed, 5 days 9 hours were able to be analyzed after artifact removed.  Predominant rhythm is sinus with heart rate ranging from 60 bpm up to 116 bpm and average heart rate 85 bpm.  There were occasional PACs representing 3.5% of total beats.  There were also occasional PVCs representing 4.9% of total beats.  Brief episodes of PSVT were noted lasting from 5-11 beats and nonsustained.  There was a single episode of NSVT lasting 4 beats.  No pauses were observed.  Echocardiogram 12/08/2018:  1. The left ventricle has normal systolic function, with an ejection fraction of 55-60%. The cavity size was normal. There is moderately increased left ventricular wall thickness. Left ventricular diastolic Doppler parameters are consistent with  impaired relaxation. Elevated mean left atrial pressure.  2. The right ventricle has normal systolic function. The cavity  was normal. There is no increase in right ventricular wall thickness.  3. Left atrial size was severely dilated.  4. Mild thickening of the mitral valve leaflet. Mild calcification of the mitral valve leaflet. There is moderate mitral annular calcification present. No evidence of mitral valve stenosis.  5. The aortic valve has an indeterminate number of cusps. Moderate thickening of the aortic valve. Moderate calcification of the aortic valve. Aortic valve regurgitation is moderate by color flow Doppler. Moderate-severe stenosis of the aortic valve.  Moderate aortic annular calcification noted.  6. Moderate to severe aortic stenosis. The highest gradient is noted with the pedhoff probe. Mean gradient 38 mmHg, dimensionless inex 0.28, AVA VTI 0.84.  7. The aorta is normal in size and structure.  8. Pulmonary hypertension is indeterminate, inadequate TR jet.  9. The interatrial septum was not well visualized.  Assessment and Plan:  1.  Moderate to severe calcific aortic stenosis, progressed since last assessment in 2017.  He does not report any obvious progressive limitations at this time with baseline ADLs and hemodialysis.  Continue follow-up with a repeat study in 6 months.  He is not a good surgical candidate but may be a candidate for TAVR ultimately.  2.  ESRD on hemodialysis.  3.  Intermittent lightheadedness.  He is on Proamatine, blood pressure is normal today.  Cardiac monitor did not demonstrate any sustained arrhythmias to necessarily explain symptoms.  LVEF is normal range.  4.  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  LVEF has normalized.  Medication Adjustments/Labs and Tests Ordered: Current medicines are reviewed at length with the patient today.  Concerns regarding medicines are outlined above.   Tests Ordered: Orders Placed This Encounter  Procedures  . ECHOCARDIOGRAM COMPLETE    Medication Changes: No orders of the defined types were placed  in this encounter.   Disposition:  Follow up 6 months in the [place] office.  Signed, Satira Sark, MD, [facility] 01/22/2019 1:27 PM    [facility] at [facility] [address] [place], [place] [address] Phone: [phone]; Fax: [phone]

## 2019-01-22 ENCOUNTER — Ambulatory Visit (INDEPENDENT_AMBULATORY_CARE_PROVIDER_SITE_OTHER): Payer: Medicare Other | Admitting: Cardiology

## 2019-01-22 ENCOUNTER — Encounter: Payer: Self-pay | Admitting: Cardiology

## 2019-01-22 ENCOUNTER — Encounter: Payer: Medicare Other | Admitting: Family

## 2019-01-22 VITALS — BP 122/65 | HR 79 | Temp 97.1°F | Ht 65.0 in | Wt 159.0 lb

## 2019-01-22 DIAGNOSIS — N186 End stage renal disease: Secondary | ICD-10-CM | POA: Diagnosis not present

## 2019-01-22 DIAGNOSIS — I779 Disorder of arteries and arterioles, unspecified: Secondary | ICD-10-CM | POA: Diagnosis not present

## 2019-01-22 DIAGNOSIS — N2581 Secondary hyperparathyroidism of renal origin: Secondary | ICD-10-CM | POA: Diagnosis not present

## 2019-01-22 DIAGNOSIS — Z992 Dependence on renal dialysis: Secondary | ICD-10-CM

## 2019-01-22 DIAGNOSIS — I471 Supraventricular tachycardia, unspecified: Secondary | ICD-10-CM

## 2019-01-22 DIAGNOSIS — L97529 Non-pressure chronic ulcer of other part of left foot with unspecified severity: Secondary | ICD-10-CM | POA: Diagnosis not present

## 2019-01-22 DIAGNOSIS — I493 Ventricular premature depolarization: Secondary | ICD-10-CM | POA: Diagnosis not present

## 2019-01-22 DIAGNOSIS — I350 Nonrheumatic aortic (valve) stenosis: Secondary | ICD-10-CM | POA: Diagnosis not present

## 2019-01-22 DIAGNOSIS — I739 Peripheral vascular disease, unspecified: Secondary | ICD-10-CM | POA: Diagnosis not present

## 2019-01-22 DIAGNOSIS — D509 Iron deficiency anemia, unspecified: Secondary | ICD-10-CM | POA: Diagnosis not present

## 2019-01-22 DIAGNOSIS — T82898A Other specified complication of vascular prosthetic devices, implants and grafts, initial encounter: Secondary | ICD-10-CM | POA: Diagnosis not present

## 2019-01-22 NOTE — Patient Instructions (Signed)
Medication Instructions: Your physician recommends that you continue on your current medications as directed. Please refer to the Current Medication list given to you today.   Labwork: None today  Procedures/Testing: Your physician has requested that you have an echocardiogram in 6 months . Echocardiography is a painless test that uses sound waves to create images of your heart. It provides your doctor with information about the size and shape of your heart and how well your heart's chambers and valves are working. This procedure takes approximately one hour. There are no restrictions for this procedure.    Follow-Up: In 6 months with Dr.McDowell  Any Additional Special Instructions Will Be Listed Below (If Applicable).     If you need a refill on your cardiac medications before your next appointment, please call your pharmacy.

## 2019-01-24 DIAGNOSIS — N186 End stage renal disease: Secondary | ICD-10-CM | POA: Diagnosis not present

## 2019-01-24 DIAGNOSIS — D509 Iron deficiency anemia, unspecified: Secondary | ICD-10-CM | POA: Diagnosis not present

## 2019-01-24 DIAGNOSIS — N2581 Secondary hyperparathyroidism of renal origin: Secondary | ICD-10-CM | POA: Diagnosis not present

## 2019-01-24 DIAGNOSIS — T82898A Other specified complication of vascular prosthetic devices, implants and grafts, initial encounter: Secondary | ICD-10-CM | POA: Diagnosis not present

## 2019-01-24 DIAGNOSIS — Z992 Dependence on renal dialysis: Secondary | ICD-10-CM | POA: Diagnosis not present

## 2019-01-25 DIAGNOSIS — T82898A Other specified complication of vascular prosthetic devices, implants and grafts, initial encounter: Secondary | ICD-10-CM | POA: Diagnosis not present

## 2019-01-25 DIAGNOSIS — N2581 Secondary hyperparathyroidism of renal origin: Secondary | ICD-10-CM | POA: Diagnosis not present

## 2019-01-25 DIAGNOSIS — D509 Iron deficiency anemia, unspecified: Secondary | ICD-10-CM | POA: Diagnosis not present

## 2019-01-25 DIAGNOSIS — Z992 Dependence on renal dialysis: Secondary | ICD-10-CM | POA: Diagnosis not present

## 2019-01-25 DIAGNOSIS — N186 End stage renal disease: Secondary | ICD-10-CM | POA: Diagnosis not present

## 2019-01-26 DIAGNOSIS — T82898A Other specified complication of vascular prosthetic devices, implants and grafts, initial encounter: Secondary | ICD-10-CM | POA: Diagnosis not present

## 2019-01-26 DIAGNOSIS — D509 Iron deficiency anemia, unspecified: Secondary | ICD-10-CM | POA: Diagnosis not present

## 2019-01-26 DIAGNOSIS — Z992 Dependence on renal dialysis: Secondary | ICD-10-CM | POA: Diagnosis not present

## 2019-01-26 DIAGNOSIS — N186 End stage renal disease: Secondary | ICD-10-CM | POA: Diagnosis not present

## 2019-01-26 DIAGNOSIS — N2581 Secondary hyperparathyroidism of renal origin: Secondary | ICD-10-CM | POA: Diagnosis not present

## 2019-01-26 DIAGNOSIS — T8249XA Other complication of vascular dialysis catheter, initial encounter: Secondary | ICD-10-CM | POA: Diagnosis not present

## 2019-01-27 DIAGNOSIS — T82898A Other specified complication of vascular prosthetic devices, implants and grafts, initial encounter: Secondary | ICD-10-CM | POA: Diagnosis not present

## 2019-01-27 DIAGNOSIS — N2581 Secondary hyperparathyroidism of renal origin: Secondary | ICD-10-CM | POA: Diagnosis not present

## 2019-01-27 DIAGNOSIS — N186 End stage renal disease: Secondary | ICD-10-CM | POA: Diagnosis not present

## 2019-01-27 DIAGNOSIS — Z992 Dependence on renal dialysis: Secondary | ICD-10-CM | POA: Diagnosis not present

## 2019-01-27 DIAGNOSIS — D509 Iron deficiency anemia, unspecified: Secondary | ICD-10-CM | POA: Diagnosis not present

## 2019-01-28 ENCOUNTER — Other Ambulatory Visit: Payer: Self-pay

## 2019-01-28 ENCOUNTER — Encounter: Payer: Self-pay | Admitting: Family

## 2019-01-28 ENCOUNTER — Ambulatory Visit (INDEPENDENT_AMBULATORY_CARE_PROVIDER_SITE_OTHER): Payer: Self-pay | Admitting: Family

## 2019-01-28 VITALS — BP 123/75 | HR 102 | Temp 98.0°F | Resp 14 | Ht 65.0 in | Wt 158.0 lb

## 2019-01-28 DIAGNOSIS — N186 End stage renal disease: Secondary | ICD-10-CM

## 2019-01-28 DIAGNOSIS — Z89431 Acquired absence of right foot: Secondary | ICD-10-CM

## 2019-01-28 DIAGNOSIS — Z992 Dependence on renal dialysis: Secondary | ICD-10-CM

## 2019-01-28 DIAGNOSIS — Z23 Encounter for immunization: Secondary | ICD-10-CM | POA: Diagnosis not present

## 2019-01-28 DIAGNOSIS — N2581 Secondary hyperparathyroidism of renal origin: Secondary | ICD-10-CM | POA: Diagnosis not present

## 2019-01-28 DIAGNOSIS — D509 Iron deficiency anemia, unspecified: Secondary | ICD-10-CM | POA: Diagnosis not present

## 2019-01-28 NOTE — Progress Notes (Signed)
CC: post op  Follow up ligation left upper arm AVG and endarterectomy and patch angioplasty of left brachial artery   History of Present Illness  Joseph LADUKE Sr. is a 78 y.o. (March 07, 1941) male who is s/p ligation of left arm AV graft and endarterectomy and patch angioplasty of left brachial artery with bovine pericardial patch on 12-31-18 by Dr. Donzetta Matters. Pt is dialyzing via right IJ catheter.  1 month prior to the above intervention he had a left brachial artery to axillary vein graft placed.  He was seen in the office with pain in the left arm worse at night.  He had no palpable pulses at the wrist and was indicated for graft ligation.  He returns today for post op follow up.   He states that his left hand feels much better since the left AVG ligation. The left upper arm incisions are all well healed.  Pt states he feels well.   Past Medical History:  Diagnosis Date  . Anxiety   . Aortic stenosis   . Arthritis   . CVA (cerebral infarction)   . ESRD (end stage renal disease) on dialysis [facility])    M/W/F at [facility] in [place]  . Essential hypertension    resolved with HD  . Gangrene of right foot ([place])   . Gastric ulcer 2004  . GI bleed    gastric ulcer  . Heart murmur   . History of blood transfusion   . History of cardiomyopathy    LVEF normal as of February 2017  . History of gastric ulcer   . History of stroke    Left side weakness  . Iron deficiency anemia   . Stroke [facility])    limp left    Social History Social History   Tobacco Use  . Smoking status: Former Smoker    Years: 25.00    Quit date: 04/29/2004    Years since quitting: 14.7  . Smokeless tobacco: Former Systems developer    Types: Chew    Quit date: 01/16/1987  . Tobacco comment: quit 2006  Substance Use Topics  . Alcohol use: No  . Drug use: No    Family History Family History  Problem Relation Age of Onset  . Hypertension Mother   . Colon cancer Neg Hx   . Liver disease Neg Hx     Surgical History Past  Surgical History:  Procedure Laterality Date  . ABDOMINAL AORTAGRAM N/A 01/24/2012   Procedure: ABDOMINAL Maxcine Ham;  Surgeon: Elam Dutch, MD;  Location: [facility] CATH LAB;  Service: Cardiovascular;  Laterality: N/A;  . ABDOMINAL AORTOGRAM W/LOWER EXTREMITY N/A 04/15/2017   Procedure: ABDOMINAL AORTOGRAM W/LOWER EXTREMITY;  Surgeon: Angelia Mould, MD;  Location: [place] CV LAB;  Service: Cardiovascular;  Laterality: N/A;  . AMPUTATION Right 05/09/2017   Procedure: RIGHT TRANSMETATARSAL AMPUTATION;  Surgeon: [name], MD;  Location: [place];  Service: Orthopedics;  Laterality: Right;  . AMPUTATION Right 06/13/2018   Procedure: AMPUTATION RIGHT LONG FINGER;  Surgeon: Dayna Barker, MD;  Location: [place];  Service: Plastics;  Laterality: Right;  . ARTERIOVENOUS GRAFT PLACEMENT Right right arm  . AV FISTULA PLACEMENT Left 08/31/2015   Procedure: ARTERIOVENOUS (AV) FISTULA CREATION- LEFT RADIOCEPHALIC;  Surgeon: Mal Misty, MD;  Location: [place];  Service: Vascular;  Laterality: Left;  . AV FISTULA PLACEMENT Right 02/18/2017   Procedure: INSERTION OF ARTERIOVENOUS (AV) GORE-TEX GRAFT  RIGHT UPPER ARM;  Surgeon: Conrad [place], MD;  Location: [facility]  OR;  Service: Vascular;  Laterality: Right;  . AV FISTULA PLACEMENT Left 12/01/2018   Procedure: INSERTION OF ARTERIOVENOUS (AV) GORE-TEX GRAFT ARM;  Surgeon: Angelia Mould, MD;  Location: [place];  Service: Vascular;  Laterality: Left;  . BASCILIC VEIN TRANSPOSITION Left 12/19/2015   Procedure: FIRST STAGE BRACHIAL VEIN TRANSPOSITION;  Surgeon: Conrad [place], MD;  Location: [place];  Service: Vascular;  Laterality: Left;  . BASCILIC VEIN TRANSPOSITION Left 02/08/2016   Procedure: SECOND STAGE BRACHIAL VEIN TRANSPOSITION;  Surgeon: Conrad Gilmore, MD;  Location: [place];  Service: Vascular;  Laterality: Left;  . CARDIAC CATHETERIZATION N/A 07/11/2015   Procedure: Left Heart Cath and Coronary Angiography;  Surgeon: Troy Sine, MD;  Location: [place] CV LAB;  Service: Cardiovascular;  Laterality: N/A;  . Carpel Tunnel Left Dec. 22, 2016  . CHOLECYSTECTOMY    . COLONOSCOPY  2004   Dr. Irving Shows, left sided diverticula and cecal polyp, path unknown  . COLONOSCOPY  10/29/2011   Procedure: COLONOSCOPY;  Surgeon: Daneil Dolin, MD;  [facility];  Service: Endoscopy;  Laterality: N/A;  10:15  . ESOPHAGOGASTRODUODENOSCOPY  11/2002   Dr. Gala Romney, erosive reflux esophagitis, multiple gastric ulcer and antral/bulbar erosions. Serologies positive for H.Pylori and was treated  . ESOPHAGOGASTRODUODENOSCOPY  11/20014   Dr. Gala Romney, small hh only, ulcers healed  . ESOPHAGOGASTRODUODENOSCOPY  09/21/2011   Dr Trevor Iha HH, antral erosions, ?early GAVE  . FISTULOGRAM Left 12/10/2016   Procedure: THROMBECTOMY OF LEFT ARM ARTERIOVENOUS FISTULA;  Surgeon: Waynetta Sandy, MD;  Location: [place];  Service: Vascular;  Laterality: Left;  . INSERTION OF DIALYSIS CATHETER Left 12/10/2016   Procedure: INSERTION OF TUNNELED DIALYSIS CATHETER;  Surgeon: Waynetta Sandy, MD;  Location: [place];  Service: Vascular;  Laterality: Left;  . INSERTION OF DIALYSIS CATHETER Right 06/09/2018   Procedure: INSERTION OF DIALYSIS CATHETER Right subclavian;  Surgeon: Angelia Mould, MD;  Location: [place];  Service: Vascular;  Laterality: Right;  . IR DIALY SHUNT INTRO [place] W/IMG RIGHT Right 01/01/2018  . IR GENERIC HISTORICAL  07/16/2016   IR REMOVAL TUN CV CATH W/O FL 07/16/2016 Saverio Danker, PA-C [facility]-INTERV RAD  . IR PTA ADDL CENTRAL DIALYSIS SEG THRU DIALY CIRCUIT RIGHT Right 10/21/2017  . IR REMOVAL TUN CV CATH W/O FL  05/12/2017  . IR THROMBECTOMY AV FISTULA W/THROMBOLYSIS/PTA INC/SHUNT/IMG RIGHT Right 10/21/2017  . IR THROMBECTOMY AV FISTULA W/THROMBOLYSIS/PTA INC/SHUNT/IMG RIGHT Right 11/27/2017  . IR US GUIDE VASC ACCESS RIGHT  10/21/2017  . IR US GUIDE VASC ACCESS RIGHT  11/27/2017  . IR US GUIDE VASC ACCESS RIGHT  01/01/2018   . LIGATION ARTERIOVENOUS GORTEX GRAFT Right 06/09/2018  . LIGATION ARTERIOVENOUS GORTEX GRAFT Right 06/09/2018   Procedure: LIGATION ARTERIOVENOUS GORTEX GRAFT RIGHT ARM;  Surgeon: Angelia Mould, MD;  Location: [place];  Service: Vascular;  Laterality: Right;  . LIGATION ARTERIOVENOUS GORTEX GRAFT Left 12/31/2018   Procedure: LIGATION OF LEFT UPPER ARM ARTERIVENOUS GORTEX GRAFT, LEFT BRACHIAL ARTERY ENDARTERECTOMY WITH BOVINE PATCH ANGIOPLASTY;  Surgeon: Waynetta Sandy, MD;  Location: [place];  Service: Vascular;  Laterality: Left;  . LIGATION OF ARTERIOVENOUS  FISTULA Left 12/19/2015   Procedure: LIGATION OF RADIOCEPHALIC ARTERIOVENOUS  FISTULA;  Surgeon: Conrad [place], MD;  Location: [place];  Service: Vascular;  Laterality: Left;  Marland Kitchen MASS EXCISION Right 02/18/2017   Procedure: EXCISION OF RIGHT AXILLARY EPIDERMAL INCLUSION CYST;  Surgeon: Conrad , MD;  Location: [place];  Service: Vascular;  Laterality: Right;  . PERIPHERAL VASCULAR CATHETERIZATION N/A 12/14/2015   Procedure: Fistulagram;  Surgeon: Conrad [place], MD;  Location: [place] CV LAB;  Service: Cardiovascular;  Laterality: N/A;  . SHOULDER SURGERY Right    fracture  . ULTRASOUND GUIDANCE FOR VASCULAR ACCESS  04/15/2017   Procedure: Ultrasound Guidance For Vascular Access;  Surgeon: Angelia Mould, MD;  Location: [place] CV LAB;  Service: Cardiovascular;;  . UPPER EXTREMITY VENOGRAPHY Bilateral 12/17/2016   Procedure: Bilateral Upper Extremity Venography;  Surgeon: Serafina Mitchell, MD;  Location: [place] CV LAB;  Service: Cardiovascular;  Laterality: Bilateral;  . UPPER EXTREMITY VENOGRAPHY Left 11/03/2018   Procedure: UPPER EXTREMITY VENOGRAPHY;  Surgeon: Serafina Mitchell, MD;  Location: [place] CV LAB;  Service: Cardiovascular;  Laterality: Left;    Allergies  Allergen Reactions  . Aspirin Other (See Comments)    Causes internal bleeding  History of ulcers    Current Outpatient Medications   Medication Sig Dispense Refill  . atorvastatin (LIPITOR) 40 MG tablet Take 40 mg by mouth daily.  3  . b complex-vitamin c-folic acid (NEPHRO-VITE) 0.8 MG TABS Take 1 tablet by mouth See admin instructions. Take one tablet daily on Tuesdays, Thursdays, Saturdays, and Sundays.    . cinacalcet (SENSIPAR) 60 MG tablet Take 60 mg by mouth every evening.     . midodrine (PROAMATINE) 10 MG tablet Take 10 mg by mouth every Monday, Wednesday, and Friday. before dialysis.    Marland Kitchen nitroGLYCERIN (NITRODUR - DOSED IN MG/24 HR) 0.2 mg/hr patch Place 1 patch (0.2 mg total) onto the skin daily. Apply 1 patch to dorsum of foot daily. 30 patch 11  . sevelamer (RENVELA) 800 MG tablet Take 1,600-3,200 mg by mouth See admin instructions. Take 3200  mg by mouth 3 times daily with full meals and take 1600 mg by mouth with snacks.     No current facility-administered medications for this visit.      REVIEW OF SYSTEMS: see HPI for pertinent positives and negatives    PHYSICAL EXAMINATION:  Vitals:   01/28/19 1017  BP: 123/75  Pulse: (!) 102  Resp: 14  Temp: 98 F (36.7 C)  TempSrc: Temporal  SpO2: 96%  Weight: 158 lb (71.7 kg)  Height: 5\' 5"  (1.651 m)   Body mass index is 26.29 kg/m.  General: The patient appears his stated age.   HEENT:  No gross abnormalities Pulmonary: Respirations are non-labored Abdomen: Soft and non-tender with. Musculoskeletal: There are no major deformities.   Neurologic: No focal weakness or paresthesias are detected, bilateral hand grip strength is 5/5. 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  Skin: There are no ulcer or rashes noted. Psychiatric: The patient has normal affect. Cardiovascular: There is a regular rate and rhythm. Left upper arm AVG is silent and hard. Right upper AV access is silent and hard.  Bilateral brachial pulses are palpable, bilateral radial and ulnar pulses are not palpable.   Non-Invasive Vascular Imaging  None recently   Medical Decision Making  CLAY SOLUM Sr. is a 78 y.o. male who presents with a completely healed ligated left upper arm AVG.   Pt has had an access in his right upper arm in the past, and the AVG in his left upper arm has been ligated, and pt states that his left hand feels much better. He has no palpable radial nor ulnar pulses bilaterally. He currently dialyzes via right IJ TDC. On MWF  at [facility].   I spoke with Dr. Scot Dock re no usable permanent access at this point. Will schedule to for vein mapping and arterial duplex of upper extremities, see any vascular surgeon afterward for discussion re creating a new access. Vinnie Level Nickel, RN, MSN, FNP-C [facility] [place] Office: [phone]  01/28/2019, 10:43 AM  Clinic MD: Laqueta Due

## 2019-01-29 ENCOUNTER — Ambulatory Visit (HOSPITAL_COMMUNITY)
Admission: RE | Admit: 2019-01-29 | Discharge: 2019-01-29 | Disposition: A | Discharge status: Home / Self Care | Payer: Medicare Other | Source: Ambulatory Visit | Attending: Vascular Surgery | Admitting: Vascular Surgery

## 2019-01-29 ENCOUNTER — Ambulatory Visit (INDEPENDENT_AMBULATORY_CARE_PROVIDER_SITE_OTHER): Payer: Self-pay | Admitting: Vascular Surgery

## 2019-01-29 ENCOUNTER — Encounter: Payer: Self-pay | Admitting: Vascular Surgery

## 2019-01-29 ENCOUNTER — Ambulatory Visit (INDEPENDENT_AMBULATORY_CARE_PROVIDER_SITE_OTHER)
Admission: RE | Admit: 2019-01-29 | Discharge: 2019-01-29 | Disposition: A | Discharge status: Home / Self Care | Payer: Medicare Other | Source: Ambulatory Visit | Attending: Vascular Surgery | Admitting: Vascular Surgery

## 2019-01-29 ENCOUNTER — Other Ambulatory Visit: Payer: Self-pay

## 2019-01-29 VITALS — BP 138/80 | HR 80 | Temp 97.9°F | Resp 20 | Ht 65.0 in | Wt 159.0 lb

## 2019-01-29 DIAGNOSIS — N2581 Secondary hyperparathyroidism of renal origin: Secondary | ICD-10-CM | POA: Diagnosis not present

## 2019-01-29 DIAGNOSIS — Z992 Dependence on renal dialysis: Secondary | ICD-10-CM

## 2019-01-29 DIAGNOSIS — N186 End stage renal disease: Secondary | ICD-10-CM | POA: Diagnosis not present

## 2019-01-29 DIAGNOSIS — Z23 Encounter for immunization: Secondary | ICD-10-CM | POA: Diagnosis not present

## 2019-01-29 DIAGNOSIS — D509 Iron deficiency anemia, unspecified: Secondary | ICD-10-CM | POA: Diagnosis not present

## 2019-01-29 NOTE — Progress Notes (Signed)
Patient ID: [name]., male   DOB: 1940-09-16, 78 y.o.   MRN: [id number]  Reason for Consult: Follow-up   Referred by [name], MD  Subjective:     HPI:  Joseph CHLEBOWSKI Sr. is a 78 y.o. male has had bilateral upper extremity grafting.  Recently had steal underwent patch angioplasty of his left brachial artery.  His hand is doing reasonably well at this point.  He has never had access in his groins.  Dialyzes on Mondays Wednesdays and Fridays.  Does not take blood thinners.  Does have a history of right toe amputations.  Past Medical History:  Diagnosis Date  . Anxiety   . Aortic stenosis   . Arthritis   . CVA (cerebral infarction)   . ESRD (end stage renal disease) on dialysis [facility])    M/W/F at [facility] in [place]  . Essential hypertension    resolved with HD  . Gangrene of right foot ([place])   . Gastric ulcer 2004  . GI bleed    gastric ulcer  . Heart murmur   . History of blood transfusion   . History of cardiomyopathy    LVEF normal as of February 2017  . History of gastric ulcer   . History of stroke    Left side weakness  . Iron deficiency anemia   . Stroke [name])    limp left   Family History  Problem Relation Age of Onset  . Hypertension Mother   . Colon cancer Neg Hx   . Liver disease Neg Hx    Past Surgical History:  Procedure Laterality Date  . ABDOMINAL AORTAGRAM N/A 01/24/2012   Procedure: ABDOMINAL Maxcine Ham;  Surgeon: Elam Dutch, MD;  Location: [facility] CATH LAB;  Service: Cardiovascular;  Laterality: N/A;  . ABDOMINAL AORTOGRAM W/LOWER EXTREMITY N/A 04/15/2017   Procedure: ABDOMINAL AORTOGRAM W/LOWER EXTREMITY;  Surgeon: Angelia Mould, MD;  Location: [place] CV LAB;  Service: Cardiovascular;  Laterality: N/A;  . AMPUTATION Right 05/09/2017   Procedure: RIGHT TRANSMETATARSAL AMPUTATION;  Surgeon: [name], MD;  Location: [place];  Service: Orthopedics;  Laterality: Right;  . AMPUTATION Right 06/13/2018   Procedure: AMPUTATION  RIGHT LONG FINGER;  Surgeon: Dayna Barker, MD;  Location: [place];  Service: Plastics;  Laterality: Right;  . ARTERIOVENOUS GRAFT PLACEMENT Right right arm  . AV FISTULA PLACEMENT Left 08/31/2015   Procedure: ARTERIOVENOUS (AV) FISTULA CREATION- LEFT RADIOCEPHALIC;  Surgeon: Mal Misty, MD;  Location: [place];  Service: Vascular;  Laterality: Left;  . AV FISTULA PLACEMENT Right 02/18/2017   Procedure: INSERTION OF ARTERIOVENOUS (AV) GORE-TEX GRAFT  RIGHT UPPER ARM;  Surgeon: Conrad [place], MD;  Location: [place];  Service: Vascular;  Laterality: Right;  . AV FISTULA PLACEMENT Left 12/01/2018   Procedure: INSERTION OF ARTERIOVENOUS (AV) GORE-TEX GRAFT ARM;  Surgeon: Angelia Mould, MD;  Location: [place];  Service: Vascular;  Laterality: Left;  . BASCILIC VEIN TRANSPOSITION Left 12/19/2015   Procedure: FIRST STAGE BRACHIAL VEIN TRANSPOSITION;  Surgeon: Conrad [place], MD;  Location: [place];  Service: Vascular;  Laterality: Left;  . BASCILIC VEIN TRANSPOSITION Left 02/08/2016   Procedure: SECOND STAGE BRACHIAL VEIN TRANSPOSITION;  Surgeon: Conrad [place], MD;  Location: [place];  Service: Vascular;  Laterality: Left;  . CARDIAC CATHETERIZATION N/A 07/11/2015   Procedure: Left Heart Cath and Coronary Angiography;  Surgeon: Troy Sine, MD;  Location: [place] CV LAB;  Service: Cardiovascular;  Laterality: N/A;  .  Carpel Tunnel Left Dec. 22, 2016  . CHOLECYSTECTOMY    . COLONOSCOPY  2004   Dr. Irving Shows, left sided diverticula and cecal polyp, path unknown  . COLONOSCOPY  10/29/2011   Procedure: COLONOSCOPY;  Surgeon: Daneil Dolin, MD;  [facility];  Service: Endoscopy;  Laterality: N/A;  10:15  . ESOPHAGOGASTRODUODENOSCOPY  11/2002   Dr. Gala Romney, erosive reflux esophagitis, multiple gastric ulcer and antral/bulbar erosions. Serologies positive for H.Pylori and was treated  . ESOPHAGOGASTRODUODENOSCOPY  11/20014   Dr. Gala Romney, small hh only, ulcers healed  . ESOPHAGOGASTRODUODENOSCOPY   09/21/2011   Dr Trevor Iha HH, antral erosions, ?early GAVE  . FISTULOGRAM Left 12/10/2016   Procedure: THROMBECTOMY OF LEFT ARM ARTERIOVENOUS FISTULA;  Surgeon: Waynetta Sandy, MD;  Location: [place];  Service: Vascular;  Laterality: Left;  . INSERTION OF DIALYSIS CATHETER Left 12/10/2016   Procedure: INSERTION OF TUNNELED DIALYSIS CATHETER;  Surgeon: Waynetta Sandy, MD;  Location: [place];  Service: Vascular;  Laterality: Left;  . INSERTION OF DIALYSIS CATHETER Right 06/09/2018   Procedure: INSERTION OF DIALYSIS CATHETER Right subclavian;  Surgeon: Angelia Mould, MD;  Location: [place];  Service: Vascular;  Laterality: Right;  . IR DIALY SHUNT INTRO [place] W/IMG RIGHT Right 01/01/2018  . IR GENERIC HISTORICAL  07/16/2016   IR REMOVAL TUN CV CATH W/O FL 07/16/2016 Saverio Danker, PA-C [facility]-INTER[facility] RAD  . IR PTA ADDL CENTRAL DIALYSIS SEG THRU DIALY CIRCUIT RIGHT Right 10/21/2017  . IR REMOVAL TUN CV CATH W/O FL  05/12/2017  . IR THROMBECTOMY AV FISTULA W/THROMBOLYSIS/PTA INC/SHUNT/IMG RIGHT Right 10/21/2017  . IR THROMBECTOMY AV FISTULA W/THROMBOLYSIS/PTA INC/SHUNT/IMG RIGHT Right 11/27/2017  . IR US GUIDE VASC ACCESS RIGHT  10/21/2017  . IR US GUIDE VASC ACCESS RIGHT  11/27/2017  . IR US GUIDE VASC ACCESS RIGHT  01/01/2018  . LIGATION ARTERIOVENOUS GORTEX GRAFT Right 06/09/2018  . LIGATION ARTERIOVENOUS GORTEX GRAFT Right 06/09/2018   Procedure: LIGATION ARTERIOVENOUS GORTEX GRAFT RIGHT ARM;  Surgeon: Angelia Mould, MD;  Location: [place];  Service: Vascular;  Laterality: Right;  . LIGATION ARTERIOVENOUS GORTEX GRAFT Left 12/31/2018   Procedure: LIGATION OF LEFT UPPER ARM ARTERIVENOUS GORTEX GRAFT, LEFT BRACHIAL ARTERY ENDARTERECTOMY WITH BOVINE PATCH ANGIOPLASTY;  Surgeon: Waynetta Sandy, MD;  Location: [place];  Service: Vascular;  Laterality: Left;  . LIGATION OF ARTERIOVENOUS  FISTULA Left 12/19/2015   Procedure: LIGATION OF RADIOCEPHALIC ARTERIOVENOUS   FISTULA;  Surgeon: Conrad [place], MD;  Location: [place];  Service: Vascular;  Laterality: Left;  Marland Kitchen MASS EXCISION Right 02/18/2017   Procedure: EXCISION OF RIGHT AXILLARY EPIDERMAL INCLUSION CYST;  Surgeon: Conrad [place], MD;  Location: [place];  Service: Vascular;  Laterality: Right;  . PERIPHERAL VASCULAR CATHETERIZATION N/A 12/14/2015   Procedure: Fistulagram;  Surgeon: Conrad [place], MD;  Location: [place] CV LAB;  Service: Cardiovascular;  Laterality: N/A;  . SHOULDER SURGERY Right    fracture  . ULTRASOUND GUIDANCE FOR VASCULAR ACCESS  04/15/2017   Procedure: Ultrasound Guidance For Vascular Access;  Surgeon: Angelia Mould, MD;  Location: [place] CV LAB;  Service: Cardiovascular;;  . UPPER EXTREMITY VENOGRAPHY Bilateral 12/17/2016   Procedure: Bilateral Upper Extremity Venography;  Surgeon: Serafina Mitchell, MD;  Location: [place] CV LAB;  Service: Cardiovascular;  Laterality: Bilateral;  . UPPER EXTREMITY VENOGRAPHY Left 11/03/2018   Procedure: UPPER EXTREMITY VENOGRAPHY;  Surgeon: Serafina Mitchell, MD;  Location: [place] CV LAB;  Service: Cardiovascular;  Laterality:  Left;    Short Social History:  Social History   Tobacco Use  . Smoking status: Former Smoker    Years: 25.00    Quit date: 04/29/2004    Years since quitting: 14.7  . Smokeless tobacco: Former Systems developer    Types: Chew    Quit date: 01/16/1987  . Tobacco comment: quit 2006  Substance Use Topics  . Alcohol use: No    Allergies  Allergen Reactions  . Aspirin Other (See Comments)    Causes internal bleeding  History of ulcers    Current Outpatient Medications  Medication Sig Dispense Refill  . atorvastatin (LIPITOR) 40 MG tablet Take 40 mg by mouth daily.  3  . b complex-vitamin c-folic acid (NEPHRO-VITE) 0.8 MG TABS Take 1 tablet by mouth See admin instructions. Take one tablet daily on Tuesdays, Thursdays, Saturdays, and Sundays.    . cinacalcet (SENSIPAR) 60 MG tablet Take 60 mg by mouth every  evening.     . midodrine (PROAMATINE) 10 MG tablet Take 10 mg by mouth every Monday, Wednesday, and Friday. before dialysis.    Marland Kitchen sevelamer (RENVELA) 800 MG tablet Take 1,600-3,200 mg by mouth See admin instructions. Take 3200  mg by mouth 3 times daily with full meals and take 1600 mg by mouth with snacks.    . nitroGLYCERIN (NITRODUR - DOSED IN MG/24 HR) 0.2 mg/hr patch Place 1 patch (0.2 mg total) onto the skin daily. Apply 1 patch to dorsum of foot daily. (Patient not taking: Reported on 01/29/2019) 30 patch 11   No current facility-administered medications for this visit.     Review of Systems  Constitutional:  Constitutional negative. HENT: HENT negative.  Eyes: Eyes negative.  Respiratory: Respiratory negative.  Cardiovascular: Cardiovascular negative.  GI: Gastrointestinal negative.  Musculoskeletal: Musculoskeletal negative.  Neurological: Neurological negative. Hematologic: Hematologic/lymphatic negative.  Psychiatric: Psychiatric negative.        Objective:  Objective   Vitals:   01/29/19 1128  BP: 138/80  Pulse: 80  Resp: 20  Temp: 97.9 F (36.6 C)  SpO2: 99%  Weight: 159 lb (72.1 kg)  Height: 5\' 5"  (1.651 m)   Body mass index is 26.46 kg/m.  Physical Exam HENT:     Head: Normocephalic.     Mouth/Throat:     Mouth: Mucous membranes are moist.  Eyes:     Pupils: Pupils are equal, round, and reactive to light.  Cardiovascular:     Rate and Rhythm: Normal rate.     Pulses:          Femoral pulses are 2+ on the left side.      Popliteal pulses are 2+ on the left side.  Pulmonary:     Effort: Pulmonary effort is normal.  Abdominal:     General: Abdomen is flat.     Palpations: Abdomen is soft.  Musculoskeletal: Normal range of motion.        General: No swelling.  Skin:    General: Skin is warm and dry.     Capillary Refill: Capillary refill takes less than 2 seconds.  Neurological:     General: No focal deficit present.     Mental Status: He is  alert.  Psychiatric:        Mood and Affect: Mood normal.        Thought Content: Thought content normal.        Judgment: Judgment normal.     Data: No studies today     Assessment/Plan:  78 year old male has had bilateral graft excisions upper extremities for steal.  Most recently a patch angioplasty of his brachial artery.  Left hand feeling fine.  Has had previous right toe amputations.  Left foot has been fine.  There is a palpable popliteal on the left as well as a soft appearing left femoral artery.  We will plan for left femoral AV loop graft.  He dialyzes Monday Wednesday Fridays.  We discussed risk of his alternatives he agrees to proceed on a nondialysis day in the near future.     Waynetta Sandy MD [facility]

## 2019-02-01 DIAGNOSIS — Z23 Encounter for immunization: Secondary | ICD-10-CM | POA: Diagnosis not present

## 2019-02-01 DIAGNOSIS — Z992 Dependence on renal dialysis: Secondary | ICD-10-CM | POA: Diagnosis not present

## 2019-02-01 DIAGNOSIS — N2581 Secondary hyperparathyroidism of renal origin: Secondary | ICD-10-CM | POA: Diagnosis not present

## 2019-02-01 DIAGNOSIS — D509 Iron deficiency anemia, unspecified: Secondary | ICD-10-CM | POA: Diagnosis not present

## 2019-02-01 DIAGNOSIS — N186 End stage renal disease: Secondary | ICD-10-CM | POA: Diagnosis not present

## 2019-02-03 DIAGNOSIS — Z23 Encounter for immunization: Secondary | ICD-10-CM | POA: Diagnosis not present

## 2019-02-03 DIAGNOSIS — N2581 Secondary hyperparathyroidism of renal origin: Secondary | ICD-10-CM | POA: Diagnosis not present

## 2019-02-03 DIAGNOSIS — N186 End stage renal disease: Secondary | ICD-10-CM | POA: Diagnosis not present

## 2019-02-03 DIAGNOSIS — D509 Iron deficiency anemia, unspecified: Secondary | ICD-10-CM | POA: Diagnosis not present

## 2019-02-03 DIAGNOSIS — Z992 Dependence on renal dialysis: Secondary | ICD-10-CM | POA: Diagnosis not present

## 2019-02-05 ENCOUNTER — Other Ambulatory Visit: Payer: Self-pay

## 2019-02-05 ENCOUNTER — Encounter (HOSPITAL_COMMUNITY): Payer: Self-pay | Admitting: *Deleted

## 2019-02-05 ENCOUNTER — Other Ambulatory Visit (HOSPITAL_COMMUNITY)
Admission: RE | Admit: 2019-02-05 | Discharge: 2019-02-05 | Disposition: A | Discharge status: Home / Self Care | Payer: Medicare Other | Source: Ambulatory Visit | Attending: Vascular Surgery | Admitting: Vascular Surgery

## 2019-02-05 DIAGNOSIS — Z20828 Contact with and (suspected) exposure to other viral communicable diseases: Secondary | ICD-10-CM | POA: Diagnosis not present

## 2019-02-05 DIAGNOSIS — Z992 Dependence on renal dialysis: Secondary | ICD-10-CM | POA: Diagnosis not present

## 2019-02-05 DIAGNOSIS — Z23 Encounter for immunization: Secondary | ICD-10-CM | POA: Diagnosis not present

## 2019-02-05 DIAGNOSIS — N2581 Secondary hyperparathyroidism of renal origin: Secondary | ICD-10-CM | POA: Diagnosis not present

## 2019-02-05 DIAGNOSIS — N186 End stage renal disease: Secondary | ICD-10-CM | POA: Diagnosis not present

## 2019-02-05 DIAGNOSIS — D509 Iron deficiency anemia, unspecified: Secondary | ICD-10-CM | POA: Diagnosis not present

## 2019-02-05 DIAGNOSIS — Z01812 Encounter for preprocedural laboratory examination: Secondary | ICD-10-CM | POA: Diagnosis not present

## 2019-02-05 LAB — SARS CORONAVIRUS 2 (TAT 6-24 HRS): SARS Coronavirus 2: NEGATIVE

## 2019-02-05 NOTE — Progress Notes (Signed)
[name] denies chest pain or shortness of breath. Patient was tested for Covid and has been in quarantine since that time, he will go to dialysis on Monday and wear a mask.  [name] states he does not know who is driving him home Tuesday, and he has no one to stay with `him; "that's why I usually spend the night."  I called Dr Claretha Cooper office and spoke with Jacqlyn Larsen and gave her that information.

## 2019-02-08 DIAGNOSIS — D509 Iron deficiency anemia, unspecified: Secondary | ICD-10-CM | POA: Diagnosis not present

## 2019-02-08 DIAGNOSIS — E785 Hyperlipidemia, unspecified: Secondary | ICD-10-CM | POA: Diagnosis not present

## 2019-02-08 DIAGNOSIS — Z5181 Encounter for therapeutic drug level monitoring: Secondary | ICD-10-CM | POA: Diagnosis not present

## 2019-02-08 DIAGNOSIS — Z79899 Other long term (current) drug therapy: Secondary | ICD-10-CM | POA: Diagnosis not present

## 2019-02-08 DIAGNOSIS — Z992 Dependence on renal dialysis: Secondary | ICD-10-CM | POA: Diagnosis not present

## 2019-02-08 DIAGNOSIS — N186 End stage renal disease: Secondary | ICD-10-CM | POA: Diagnosis not present

## 2019-02-08 DIAGNOSIS — Z23 Encounter for immunization: Secondary | ICD-10-CM | POA: Diagnosis not present

## 2019-02-08 DIAGNOSIS — N2581 Secondary hyperparathyroidism of renal origin: Secondary | ICD-10-CM | POA: Diagnosis not present

## 2019-02-08 NOTE — Anesthesia Preprocedure Evaluation (Addendum)
Anesthesia Evaluation  Patient identified by MRN, date of birth, ID band Patient awake    Reviewed: Allergy & Precautions, H&P , NPO status , Patient's Chart, lab work & pertinent test results  Airway Mallampati: II  TM Distance: >3 FB Neck ROM: Full    Dental no notable dental hx. (+) Edentulous Upper, Edentulous Lower, Dental Advisory Given   Pulmonary neg pulmonary ROS, former smoker,    Pulmonary exam normal breath sounds clear to auscultation       Cardiovascular hypertension, + Peripheral Vascular Disease  + Valvular Problems/Murmurs AS  Rhythm:Regular Rate:Normal     Neuro/Psych Anxiety CVA    GI/Hepatic Neg liver ROS, PUD,   Endo/Other  negative endocrine ROS  Renal/GU ESRF and DialysisRenal disease  negative genitourinary   Musculoskeletal  (+) Arthritis , Osteoarthritis,    Abdominal   Peds  Hematology  (+) Blood dyscrasia, anemia ,   Anesthesia Other Findings   Reproductive/Obstetrics negative OB ROS                           Anesthesia Physical Anesthesia Plan  ASA: IV  Anesthesia Plan: General   Post-op Pain Management:    Induction: Intravenous  PONV Risk Score and Plan: 3 and Ondansetron, Dexamethasone and Treatment may vary due to age or medical condition  Airway Management Planned: Oral ETT  Additional Equipment:   Intra-op Plan:   Post-operative Plan: Extubation in OR  Informed Consent: I have reviewed the patients History and Physical, chart, labs and discussed the procedure including the risks, benefits and alternatives for the proposed anesthesia with the patient or authorized representative who has indicated his/her understanding and acceptance.     Dental advisory given  Plan Discussed with: CRNA  Anesthesia Plan Comments: (PAT note written by Myra Gianotti, PA-C. )       Anesthesia Quick Evaluation

## 2019-02-08 NOTE — Progress Notes (Signed)
Anesthesia Chart Review: SAME DAY WORK-UP    Case: [id number] Date/Time: 02/09/19 0908   Procedure: INSERTION OF ARTERIOVENOUS (AV) GORE-TEX GRAFT LEFT THIGH (Left )   Anesthesia type: Monitor Anesthesia Care   Pre-op diagnosis: TO PROVIDE ACCESS FOR HEMODIALYSIS   Location: [facility] ROOM 11 / [place]   Surgeon: Waynetta Sandy, MD      DISCUSSION: Patient is a 78 year old male scheduled for the above procedure.He is s/p insertion of LUE AVGG 12/01/18 and is currently getting hemodialyzed (MWF, [place]) via a right [place] vein tunneled catheter. Unfortunately, he developed steal syndrome and underwent ligation of LUE AVGG with left brachial artery endarterectomy/patch angioplasty on 12/31/18.  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  Patient as last seen by Dr. Domenic Polite on 925/20 for cardiac follow-up. He was not having any worsening intermittent lightheadedness and no syncope, chest pain, or palpitations. On midodrine for hypotension with dialysis. Recent ZioPatch showed occasional ectopy with brief episodes of PSVT and a single episode of NSVT, but not sustained arrhythmias or pauses. August echo showed moderate-severe AS with plans to re-evaluate in six months. (Not felt to be a good surgical candidate, but potentially could be considered for TAVR if progresses to severe AS.)  He denied chest pain and SOB per PAT phone RN. If labs acceptable and no acute changes/symptoms then would anticipate that he could proceed as planned.Anesthesia team to  evaluate on the day of surgery.  02/05/19 Presurgical COVIDtestnegative. Dr. Claretha Cooper office notified that patient does not have any one to stay with him after surgery.     VS: Day of surgery.   PROVIDERS: [name], MDis listed as PCP Fran Lowes, MD is nephrologist Rozann Lesches, MD is cardiologist   LABS:He is for updated labs prior to surgery.   IMAGES: CXR 08/02/18: IMPRESSION: 1. No acute cardiopulmonary disease. 2. Thickened bronchovascular markings and interstitial markings are chronic, stable from prior studies dating to 03/31/2017.    EKG:08/02/18: Sinus tachycardia Multiform ventricular premature complexes Right bundle branch block No significant change since last tracing 13 Jun 2018 Confirmed by [name] [id number]) on 08/02/2018 6:16:11 AM    CV: ZioPatch 12/23/18-01/03/19: Study Highlights ZIO Patch monitor reviewed, 5 days 9 hours were able to be analyzed after artifact removed.  Predominant rhythm is sinus with heart rate ranging from 60 bpm up to 116 bpm and average heart rate 85 bpm.  There were occasional PACs representing 3.5% of total beats.  There were also occasional PVCs representing 4.9% of total beats.  Brief episodes of PSVT were noted lasting from 5-11 beats and nonsustained.  There was a single episode of NSVT lasting 4 beats.  No pauses were observed. - Reviewed by Dr. Domenic Polite, "Cardiac monitor showed occasional atrial and ventricular ectopy with brief episodes of PSVT and single episode of NSVT. Not entirely clear that this would explain his episodes of lightheadedness as there were no sustained arrhythmias or pauses..."    Echo 12/08/18: IMPRESSIONS 1. The left ventricle has normal systolic function, with an ejection fraction of 55-60%. The cavity size was normal. There is moderately increased left ventricular wall thickness. Left ventricular diastolic Doppler parameters  are consistent with  impaired relaxation. Elevated  mean left atrial pressure. 2. The right ventricle has normal systolic function. The cavity was normal. There is no increase in right ventricular wall thickness. 3. Left atrial size was severely dilated. 4. Mild thickening of the mitral valve leaflet. Mild calcification of the mitral valve leaflet. There is moderate mitral annular calcification present. No evidence of mitral valve stenosis. 5. The aortic valve has an indeterminate number of cusps. Moderate thickening of the aortic valve. Moderate calcification of the aortic valve. Aortic valve regurgitation is moderate by color flow Doppler. Moderate-severe stenosis of the aortic valve.  Moderate aortic annular calcification noted. 6. Moderate to severe aortic stenosis. The highest gradient is noted with the pedhoff probe. Mean gradient 38 mmHg, dimensionless inex 0.28, AVA VTI 0.84. 7. The aorta is normal in size and structure. 8. Pulmonary hypertension is indeterminate, inadequate TR jet. 9. The interatrial septum was not well visualized. (Comparison echo 06/27/15 showed moderate AS. Valve area (VTI): 0.89 cm^2. Valve area (Vmax):1.02 cm^2. Valve area (Vmean): 0.91 cm^2.)   Cardiac cath 07/11/15 (done for abnormal 06/27/15 stress test, EF 36%, apical-inferior perfusion defect, inferior hypokinesis):  Prox LAD lesion, 45% stenosed.  There is moderate to severe left ventricular systolic dysfunction. -Nonischemic cardiomyopathy with global hypokinesis with more pronounced inferior hypocontractility and overall ejection fraction at 35%. -Mild coronary calcification with 40-50% eccentric proximal LAD stenosis just prior to a septal perforating artery; mild calcification of the proximal left circumflex coronary artery; and large dominant RCA without significant obstructive stenoses. -Probably at least mild aortic stenosis with LV to AO gradient ranging from 10 -18 mm Hg in this patient with reduced LV function; LVEDP 13 mm Hg.   RECOMMENDATION: Medical therapy for his cardiomyopathy and mild CAD. Consider a dobutamine stress echo if concern for low gradient AS with reduced LV function.   Past Medical History:  Diagnosis Date  . Anxiety   . Aortic stenosis   . Arthritis    left hand  . CVA (cerebral infarction)   . ESRD (end stage renal disease) on dialysis [facility])    M/W/F at [facility] in [place]  . Essential hypertension    resolved with HD  . Gangrene of right foot ([place])   . Gastric ulcer 2004  . GI bleed    gastric ulcer  . Heart murmur   . History of blood transfusion   . History of cardiomyopathy    LVEF normal as of February 2017  . History of gastric ulcer   . History of stroke    Left side weakness  . Iron deficiency anemia   . Peripheral vascular disease ([place])   . Stroke [facility]) 2000   limp left    Past Surgical History:  Procedure Laterality Date  . ABDOMINAL AORTAGRAM N/A 01/24/2012   Procedure: ABDOMINAL Maxcine Ham;  Surgeon: Elam Dutch, MD;  Location: [facility] CATH LAB;  Service: Cardiovascular;  Laterality: N/A;  . ABDOMINAL AORTOGRAM W/LOWER EXTREMITY N/A 04/15/2017   Procedure: ABDOMINAL AORTOGRAM W/LOWER EXTREMITY;  Surgeon: Angelia Mould, MD;  Location: [place] CV LAB;  Service: Cardiovascular;  Laterality: N/A;  . AMPUTATION Right 05/09/2017   Procedure: RIGHT TRANSMETATARSAL AMPUTATION;  Surgeon: [name], MD;  Location: [place];  Service: Orthopedics;  Laterality: Right;  . AMPUTATION Right 06/13/2018   Procedure: AMPUTATION RIGHT LONG FINGER;  Surgeon: Dayna Barker, MD;  Location: [place];  Service: Plastics;  Laterality: Right;  . ARTERIOVENOUS GRAFT PLACEMENT Right right arm  .  AV FISTULA PLACEMENT Left 08/31/2015   Procedure: ARTERIOVENOUS (AV) FISTULA CREATION- LEFT RADIOCEPHALIC;  Surgeon: Mal Misty, MD;  Location: [place];  Service: Vascular;  Laterality: Left;  . AV FISTULA PLACEMENT Right 02/18/2017   Procedure: INSERTION OF ARTERIOVENOUS (AV)  GORE-TEX GRAFT  RIGHT UPPER ARM;  Surgeon: Conrad Snead, MD;  Location: [place];  Service: Vascular;  Laterality: Right;  . AV FISTULA PLACEMENT Left 12/01/2018   Procedure: INSERTION OF ARTERIOVENOUS (AV) GORE-TEX GRAFT ARM;  Surgeon: Angelia Mould, MD;  Location: [place];  Service: Vascular;  Laterality: Left;  . BASCILIC VEIN TRANSPOSITION Left 12/19/2015   Procedure: FIRST STAGE BRACHIAL VEIN TRANSPOSITION;  Surgeon: Conrad Clinchco, MD;  Location: [place];  Service: Vascular;  Laterality: Left;  . BASCILIC VEIN TRANSPOSITION Left 02/08/2016   Procedure: SECOND STAGE BRACHIAL VEIN TRANSPOSITION;  Surgeon: Conrad [place], MD;  Location: [place];  Service: Vascular;  Laterality: Left;  . CARDIAC CATHETERIZATION N/A 07/11/2015   Procedure: Left Heart Cath and Coronary Angiography;  Surgeon: Troy Sine, MD;  Location: [place] CV LAB;  Service: Cardiovascular;  Laterality: N/A;  . Carpel Tunnel Left Dec. 22, 2016  . CHOLECYSTECTOMY    . COLONOSCOPY  2004   Dr. Irving Shows, left sided diverticula and cecal polyp, path unknown  . COLONOSCOPY  10/29/2011   Procedure: COLONOSCOPY;  Surgeon: Daneil Dolin, MD;  [facility];  Service: Endoscopy;  Laterality: N/A;  10:15  . ESOPHAGOGASTRODUODENOSCOPY  11/2002   Dr. Gala Romney, erosive reflux esophagitis, multiple gastric ulcer and antral/bulbar erosions. Serologies positive for H.Pylori and was treated  . ESOPHAGOGASTRODUODENOSCOPY  11/20014   Dr. Gala Romney, small hh only, ulcers healed  . ESOPHAGOGASTRODUODENOSCOPY  09/21/2011   Dr Trevor Iha [facility], antral erosions, ?early GAVE  . FISTULOGRAM Left 12/10/2016   Procedure: THROMBECTOMY OF LEFT ARM ARTERIOVENOUS FISTULA;  Surgeon: Waynetta Sandy, MD;  Location: [place];  Service: Vascular;  Laterality: Left;  . INSERTION OF DIALYSIS CATHETER Left 12/10/2016   Procedure: INSERTION OF TUNNELED DIALYSIS CATHETER;  Surgeon: Waynetta Sandy, MD;  Location: [facility];  Service: Vascular;   Laterality: Left;  . INSERTION OF DIALYSIS CATHETER Right 06/09/2018   Procedure: INSERTION OF DIALYSIS CATHETER Right subclavian;  Surgeon: Angelia Mould, MD;  Location: [place];  Service: Vascular;  Laterality: Right;  . IR DIALY SHUNT INTRO [place] W/IMG RIGHT Right 01/01/2018  . IR GENERIC HISTORICAL  07/16/2016   IR REMOVAL TUN CV CATH W/O FL 07/16/2016 Saverio Danker, PA-C [facility]-INTERV RAD  . IR PTA ADDL CENTRAL DIALYSIS SEG THRU DIALY CIRCUIT RIGHT Right 10/21/2017  . IR REMOVAL TUN CV CATH W/O FL  05/12/2017  . IR THROMBECTOMY AV FISTULA W/THROMBOLYSIS/PTA INC/SHUNT/IMG RIGHT Right 10/21/2017  . IR THROMBECTOMY AV FISTULA W/THROMBOLYSIS/PTA INC/SHUNT/IMG RIGHT Right 11/27/2017  . IR US GUIDE VASC ACCESS RIGHT  10/21/2017  . IR US GUIDE VASC ACCESS RIGHT  11/27/2017  . IR US GUIDE VASC ACCESS RIGHT  01/01/2018  . LIGATION ARTERIOVENOUS GORTEX GRAFT Right 06/09/2018  . LIGATION ARTERIOVENOUS GORTEX GRAFT Right 06/09/2018   Procedure: LIGATION ARTERIOVENOUS GORTEX GRAFT RIGHT ARM;  Surgeon: Angelia Mould, MD;  Location: [place];  Service: Vascular;  Laterality: Right;  . LIGATION ARTERIOVENOUS GORTEX GRAFT Left 12/31/2018   Procedure: LIGATION OF LEFT UPPER ARM ARTERIVENOUS GORTEX GRAFT, LEFT BRACHIAL ARTERY ENDARTERECTOMY WITH BOVINE PATCH ANGIOPLASTY;  Surgeon: Waynetta Sandy, MD;  Location: [place];  Service: Vascular;  Laterality: Left;  .  LIGATION OF ARTERIOVENOUS  FISTULA Left 12/19/2015   Procedure: LIGATION OF RADIOCEPHALIC ARTERIOVENOUS  FISTULA;  Surgeon: Conrad Haxtun, MD;  Location: [place];  Service: Vascular;  Laterality: Left;  Marland Kitchen MASS EXCISION Right 02/18/2017   Procedure: EXCISION OF RIGHT AXILLARY EPIDERMAL INCLUSION CYST;  Surgeon: Conrad Prunedale, MD;  Location: [place];  Service: Vascular;  Laterality: Right;  . PERIPHERAL VASCULAR CATHETERIZATION N/A 12/14/2015   Procedure: Fistulagram;  Surgeon: Conrad Varn[place], MD;  Location: [place] CV LAB;  Service:  Cardiovascular;  Laterality: N/A;  . SHOULDER SURGERY Right    fracture  . ULTRASOUND GUIDANCE FOR VASCULAR ACCESS  04/15/2017   Procedure: Ultrasound Guidance For Vascular Access;  Surgeon: Angelia Mould, MD;  Location: [place] CV LAB;  Service: Cardiovascular;;  . UPPER EXTREMITY VENOGRAPHY Bilateral 12/17/2016   Procedure: Bilateral Upper Extremity Venography;  Surgeon: Serafina Mitchell, MD;  Location: [place] CV LAB;  Service: Cardiovascular;  Laterality: Bilateral;  . UPPER EXTREMITY VENOGRAPHY Left 11/03/2018   Procedure: UPPER EXTREMITY VENOGRAPHY;  Surgeon: Serafina Mitchell, MD;  Location: [place] CV LAB;  Service: Cardiovascular;  Laterality: Left;    MEDICATIONS: No current facility-administered medications for this encounter.    Marland Kitchen atorvastatin (LIPITOR) 40 MG tablet  . b complex-vitamin c-folic acid (NEPHRO-VITE) 0.8 MG TABS  . cinacalcet (SENSIPAR) 60 MG tablet  . midodrine (PROAMATINE) 10 MG tablet  . nitroGLYCERIN (NITRODUR - DOSED IN MG/24 HR) 0.2 mg/hr patch  . sevelamer (RENVELA) 800 MG tablet     Myra Gianotti, PA-C Surgical Short Stay/Anesthesiology [facility] Phone [phone] [facility] Phone [phone] 02/08/2019 9:28 AM

## 2019-02-09 ENCOUNTER — Encounter (HOSPITAL_COMMUNITY): Payer: Self-pay

## 2019-02-09 ENCOUNTER — Observation Stay (HOSPITAL_COMMUNITY)
Admission: RE | Admit: 2019-02-09 | Discharge: 2019-02-10 | Disposition: A | Discharge status: Home / Self Care | Payer: Medicare Other | Attending: Vascular Surgery | Admitting: Vascular Surgery

## 2019-02-09 ENCOUNTER — Other Ambulatory Visit: Payer: Self-pay

## 2019-02-09 ENCOUNTER — Ambulatory Visit (HOSPITAL_COMMUNITY): Payer: Medicare Other | Admitting: Vascular Surgery

## 2019-02-09 ENCOUNTER — Encounter (HOSPITAL_COMMUNITY)
Admission: RE | Disposition: A | Discharge status: Home / Self Care | Payer: Self-pay | Source: Home / Self Care | Attending: Vascular Surgery

## 2019-02-09 DIAGNOSIS — F419 Anxiety disorder, unspecified: Secondary | ICD-10-CM | POA: Diagnosis not present

## 2019-02-09 DIAGNOSIS — D509 Iron deficiency anemia, unspecified: Secondary | ICD-10-CM | POA: Diagnosis not present

## 2019-02-09 DIAGNOSIS — M1990 Unspecified osteoarthritis, unspecified site: Secondary | ICD-10-CM | POA: Insufficient documentation

## 2019-02-09 DIAGNOSIS — I120 Hypertensive chronic kidney disease with stage 5 chronic kidney disease or end stage renal disease: Principal | ICD-10-CM | POA: Insufficient documentation

## 2019-02-09 DIAGNOSIS — N186 End stage renal disease: Secondary | ICD-10-CM | POA: Diagnosis not present

## 2019-02-09 DIAGNOSIS — I739 Peripheral vascular disease, unspecified: Secondary | ICD-10-CM | POA: Diagnosis not present

## 2019-02-09 DIAGNOSIS — Z886 Allergy status to analgesic agent status: Secondary | ICD-10-CM | POA: Diagnosis not present

## 2019-02-09 DIAGNOSIS — Z87891 Personal history of nicotine dependence: Secondary | ICD-10-CM | POA: Insufficient documentation

## 2019-02-09 DIAGNOSIS — Z992 Dependence on renal dialysis: Secondary | ICD-10-CM | POA: Diagnosis not present

## 2019-02-09 DIAGNOSIS — E8889 Other specified metabolic disorders: Secondary | ICD-10-CM | POA: Insufficient documentation

## 2019-02-09 DIAGNOSIS — I350 Nonrheumatic aortic (valve) stenosis: Secondary | ICD-10-CM | POA: Insufficient documentation

## 2019-02-09 DIAGNOSIS — Z79899 Other long term (current) drug therapy: Secondary | ICD-10-CM | POA: Diagnosis not present

## 2019-02-09 DIAGNOSIS — Z8673 Personal history of transient ischemic attack (TIA), and cerebral infarction without residual deficits: Secondary | ICD-10-CM | POA: Diagnosis not present

## 2019-02-09 DIAGNOSIS — N2581 Secondary hyperparathyroidism of renal origin: Secondary | ICD-10-CM | POA: Diagnosis not present

## 2019-02-09 DIAGNOSIS — N185 Chronic kidney disease, stage 5: Secondary | ICD-10-CM | POA: Diagnosis not present

## 2019-02-09 HISTORY — PX: AV FISTULA PLACEMENT: SHX1204

## 2019-02-09 HISTORY — DX: Peripheral vascular disease, unspecified: I73.9

## 2019-02-09 LAB — SURGICAL PCR SCREEN
MRSA, PCR: POSITIVE — AB
Staphylococcus aureus: POSITIVE — AB

## 2019-02-09 LAB — POCT I-STAT, CHEM 8
BUN: 21 mg/dL (ref 8–23)
Calcium, Ion: 1.03 mmol/L — ABNORMAL LOW (ref 1.15–1.40)
Chloride: 98 mmol/L (ref 98–111)
Creatinine, Ser: 7.4 mg/dL — ABNORMAL HIGH (ref 0.61–1.24)
Glucose, Bld: 80 mg/dL (ref 70–99)
HCT: 49 % (ref 39.0–52.0)
Hemoglobin: 16.7 g/dL (ref 13.0–17.0)
Potassium: 3.6 mmol/L (ref 3.5–5.1)
Sodium: 137 mmol/L (ref 135–145)
TCO2: 26 mmol/L (ref 22–32)

## 2019-02-09 LAB — GLUCOSE, CAPILLARY: Glucose-Capillary: 111 mg/dL — ABNORMAL HIGH (ref 70–99)

## 2019-02-09 SURGERY — INSERTION OF ARTERIOVENOUS (AV) GORE-TEX GRAFT THIGH
Anesthesia: General | Site: Leg Upper | Laterality: Left

## 2019-02-09 MED ORDER — CEFAZOLIN SODIUM-DEXTROSE 1-4 GM/50ML-% IV SOLN
1.0000 g | Freq: Once | INTRAVENOUS | Status: AC
  Administered 2019-02-10: 1 g via INTRAVENOUS
  Filled 2019-02-09: qty 50

## 2019-02-09 MED ORDER — SEVELAMER CARBONATE 800 MG PO TABS: 1600.0000 mg | ORAL_TABLET | Freq: Three times a day (TID) | ORAL | Status: DC | PRN

## 2019-02-09 MED ORDER — CHLORHEXIDINE GLUCONATE CLOTH 2 % EX PADS: 6.0000 | MEDICATED_PAD | Freq: Once | CUTANEOUS | Status: DC

## 2019-02-09 MED ORDER — LABETALOL HCL 5 MG/ML IV SOLN: 10.0000 mg | INTRAVENOUS | Status: DC | PRN

## 2019-02-09 MED ORDER — LIDOCAINE-EPINEPHRINE (PF) 1 %-1:200000 IJ SOLN
INTRAMUSCULAR | Status: AC
  Filled 2019-02-09: qty 30

## 2019-02-09 MED ORDER — SUCCINYLCHOLINE CHLORIDE 200 MG/10ML IV SOSY
PREFILLED_SYRINGE | INTRAVENOUS | Status: DC | PRN
  Administered 2019-02-09: 80 mg via INTRAVENOUS

## 2019-02-09 MED ORDER — ONDANSETRON HCL 4 MG/2ML IJ SOLN: 4.0000 mg | Freq: Four times a day (QID) | INTRAMUSCULAR | Status: DC | PRN

## 2019-02-09 MED ORDER — FENTANYL CITRATE (PF) 100 MCG/2ML IJ SOLN
25.0000 ug | INTRAMUSCULAR | Status: DC | PRN
  Administered 2019-02-09 (×2): 25 ug via INTRAVENOUS

## 2019-02-09 MED ORDER — 0.9 % SODIUM CHLORIDE (POUR BTL) OPTIME
TOPICAL | Status: DC | PRN
  Administered 2019-02-09: 1000 mL

## 2019-02-09 MED ORDER — SODIUM CHLORIDE 0.9 % IV SOLN
INTRAVENOUS | Status: DC | PRN
  Administered 2019-02-09: 09:00:00 25 ug/min via INTRAVENOUS

## 2019-02-09 MED ORDER — FENTANYL CITRATE (PF) 250 MCG/5ML IJ SOLN
INTRAMUSCULAR | Status: AC
  Filled 2019-02-09: qty 5

## 2019-02-09 MED ORDER — SODIUM CHLORIDE 0.9 % IV SOLN
INTRAVENOUS | Status: DC
  Administered 2019-02-09 (×2): via INTRAVENOUS

## 2019-02-09 MED ORDER — MUPIROCIN 2 % EX OINT
1.0000 "application " | TOPICAL_OINTMENT | Freq: Once | CUTANEOUS | Status: AC
  Administered 2019-02-09: 1 via TOPICAL
  Filled 2019-02-09: qty 22

## 2019-02-09 MED ORDER — SEVELAMER CARBONATE 800 MG PO TABS
3200.0000 mg | ORAL_TABLET | Freq: Three times a day (TID) | ORAL | Status: DC
  Administered 2019-02-09 – 2019-02-10 (×2): 3200 mg via ORAL
  Filled 2019-02-09 (×2): qty 4

## 2019-02-09 MED ORDER — ACETAMINOPHEN 325 MG PO TABS: 325.0000 mg | ORAL_TABLET | ORAL | Status: DC | PRN

## 2019-02-09 MED ORDER — RENA-VITE PO TABS
1.0000 | ORAL_TABLET | Freq: Every day | ORAL | Status: DC
  Administered 2019-02-09: 1 via ORAL
  Filled 2019-02-09: qty 1

## 2019-02-09 MED ORDER — LIDOCAINE 2% (20 MG/ML) 5 ML SYRINGE
INTRAMUSCULAR | Status: DC | PRN
  Administered 2019-02-09: 60 mg via INTRAVENOUS

## 2019-02-09 MED ORDER — ONDANSETRON HCL 4 MG/2ML IJ SOLN
INTRAMUSCULAR | Status: DC | PRN
  Administered 2019-02-09: 4 mg via INTRAVENOUS

## 2019-02-09 MED ORDER — MORPHINE SULFATE (PF) 2 MG/ML IV SOLN: 2.0000 mg | INTRAVENOUS | Status: DC | PRN

## 2019-02-09 MED ORDER — PROPOFOL 10 MG/ML IV BOLUS
INTRAVENOUS | Status: DC | PRN
  Administered 2019-02-09: 100 mg via INTRAVENOUS

## 2019-02-09 MED ORDER — PHENOL 1.4 % MT LIQD: 1.0000 | OROMUCOSAL | Status: DC | PRN

## 2019-02-09 MED ORDER — FENTANYL CITRATE (PF) 100 MCG/2ML IJ SOLN
INTRAMUSCULAR | Status: AC
  Filled 2019-02-09: qty 2

## 2019-02-09 MED ORDER — GLYCOPYRROLATE PF 0.2 MG/ML IJ SOSY
PREFILLED_SYRINGE | INTRAMUSCULAR | Status: DC | PRN
  Administered 2019-02-09: 0.4 mg via INTRAVENOUS

## 2019-02-09 MED ORDER — CEFAZOLIN SODIUM-DEXTROSE 2-4 GM/100ML-% IV SOLN
2.0000 g | INTRAVENOUS | Status: AC
  Administered 2019-02-09: 09:00:00 2 g via INTRAVENOUS

## 2019-02-09 MED ORDER — ATORVASTATIN CALCIUM 40 MG PO TABS
40.0000 mg | ORAL_TABLET | Freq: Every day | ORAL | Status: DC
  Administered 2019-02-09: 40 mg via ORAL
  Filled 2019-02-09: qty 1

## 2019-02-09 MED ORDER — PANTOPRAZOLE SODIUM 40 MG PO TBEC
40.0000 mg | DELAYED_RELEASE_TABLET | Freq: Every day | ORAL | Status: DC
  Administered 2019-02-10: 40 mg via ORAL
  Filled 2019-02-09: qty 1

## 2019-02-09 MED ORDER — HEPARIN SODIUM (PORCINE) 1000 UNIT/ML IJ SOLN
INTRAMUSCULAR | Status: DC | PRN
  Administered 2019-02-09: 3000 [IU] via INTRAVENOUS

## 2019-02-09 MED ORDER — FENTANYL CITRATE (PF) 100 MCG/2ML IJ SOLN
INTRAMUSCULAR | Status: DC | PRN
  Administered 2019-02-09 (×4): 25 ug via INTRAVENOUS
  Administered 2019-02-09: 50 ug via INTRAVENOUS

## 2019-02-09 MED ORDER — ACETAMINOPHEN 325 MG RE SUPP: 325.0000 mg | RECTAL | Status: DC | PRN

## 2019-02-09 MED ORDER — OXYCODONE-ACETAMINOPHEN 5-325 MG PO TABS
1.0000 | ORAL_TABLET | ORAL | Status: DC | PRN
  Administered 2019-02-10: 2 via ORAL
  Filled 2019-02-09: qty 2

## 2019-02-09 MED ORDER — ALUM & MAG HYDROXIDE-SIMETH 200-200-20 MG/5ML PO SUSP: 15.0000 mL | ORAL | Status: DC | PRN

## 2019-02-09 MED ORDER — VANCOMYCIN HCL IN DEXTROSE 1-5 GM/200ML-% IV SOLN
1000.0000 mg | Freq: Once | INTRAVENOUS | Status: AC
  Administered 2019-02-09: 09:00:00 1000 mg via INTRAVENOUS
  Filled 2019-02-09: qty 200

## 2019-02-09 MED ORDER — MIDODRINE HCL 5 MG PO TABS
10.0000 mg | ORAL_TABLET | ORAL | Status: DC
  Administered 2019-02-10: 10 mg via ORAL
  Filled 2019-02-09: qty 2

## 2019-02-09 MED ORDER — CHLORHEXIDINE GLUCONATE CLOTH 2 % EX PADS
6.0000 | MEDICATED_PAD | Freq: Every day | CUTANEOUS | Status: DC
  Administered 2019-02-10: 6 via TOPICAL

## 2019-02-09 MED ORDER — ROCURONIUM BROMIDE 100 MG/10ML IV SOLN
INTRAVENOUS | Status: DC | PRN
  Administered 2019-02-09: 30 mg via INTRAVENOUS

## 2019-02-09 MED ORDER — SODIUM CHLORIDE 0.9 % IV SOLN
INTRAVENOUS | Status: DC | PRN
  Administered 2019-02-09: 500 mL

## 2019-02-09 MED ORDER — PHENYLEPHRINE 40 MCG/ML (10ML) SYRINGE FOR IV PUSH (FOR BLOOD PRESSURE SUPPORT)
PREFILLED_SYRINGE | INTRAVENOUS | Status: DC | PRN
  Administered 2019-02-09 (×2): 80 ug via INTRAVENOUS
  Administered 2019-02-09: 40 ug via INTRAVENOUS
  Administered 2019-02-09: 80 ug via INTRAVENOUS

## 2019-02-09 MED ORDER — CINACALCET HCL 30 MG PO TABS
60.0000 mg | ORAL_TABLET | Freq: Every evening | ORAL | Status: DC
  Administered 2019-02-09: 60 mg via ORAL
  Filled 2019-02-09: qty 2

## 2019-02-09 MED ORDER — CEFAZOLIN SODIUM-DEXTROSE 2-4 GM/100ML-% IV SOLN
INTRAVENOUS | Status: AC
  Filled 2019-02-09: qty 100

## 2019-02-09 MED ORDER — METOPROLOL TARTRATE 5 MG/5ML IV SOLN: 2.0000 mg | INTRAVENOUS | Status: DC | PRN

## 2019-02-09 MED ORDER — BISACODYL 10 MG RE SUPP: 10.0000 mg | Freq: Every day | RECTAL | Status: DC | PRN

## 2019-02-09 MED ORDER — GUAIFENESIN-DM 100-10 MG/5ML PO SYRP: 15.0000 mL | ORAL_SOLUTION | ORAL | Status: DC | PRN

## 2019-02-09 MED ORDER — ACETAMINOPHEN 500 MG PO TABS
1000.0000 mg | ORAL_TABLET | Freq: Once | ORAL | Status: AC
  Administered 2019-02-09: 1000 mg via ORAL
  Filled 2019-02-09: qty 2

## 2019-02-09 MED ORDER — NEOSTIGMINE METHYLSULFATE 10 MG/10ML IV SOLN
INTRAVENOUS | Status: DC | PRN
  Administered 2019-02-09: 3 mg via INTRAVENOUS

## 2019-02-09 MED ORDER — VANCOMYCIN HCL IN DEXTROSE 1-5 GM/200ML-% IV SOLN
INTRAVENOUS | Status: AC
  Filled 2019-02-09: qty 200

## 2019-02-09 MED ORDER — HYDRALAZINE HCL 20 MG/ML IJ SOLN: 5.0000 mg | INTRAMUSCULAR | Status: DC | PRN

## 2019-02-09 MED ORDER — POLYETHYLENE GLYCOL 3350 17 G PO PACK: 17.0000 g | PACK | Freq: Every day | ORAL | Status: DC | PRN

## 2019-02-09 MED ORDER — DEXAMETHASONE SODIUM PHOSPHATE 10 MG/ML IJ SOLN
INTRAMUSCULAR | Status: DC | PRN
  Administered 2019-02-09: 10 mg via INTRAVENOUS

## 2019-02-09 MED ORDER — POTASSIUM CHLORIDE CRYS ER 20 MEQ PO TBCR
20.0000 meq | EXTENDED_RELEASE_TABLET | Freq: Once | ORAL | Status: AC
  Administered 2019-02-09: 20 meq via ORAL
  Filled 2019-02-09: qty 1

## 2019-02-09 MED ORDER — MUPIROCIN 2 % EX OINT
1.0000 "application " | TOPICAL_OINTMENT | Freq: Two times a day (BID) | CUTANEOUS | Status: DC
  Administered 2019-02-09 – 2019-02-10 (×2): 1 via NASAL
  Filled 2019-02-09: qty 22

## 2019-02-09 MED ORDER — SODIUM CHLORIDE 0.9 % IV SOLN
INTRAVENOUS | Status: AC
  Filled 2019-02-09: qty 1.2

## 2019-02-09 SURGICAL SUPPLY — 34 items
ADH SKN CLS APL DERMABOND .7 (GAUZE/BANDAGES/DRESSINGS) ×2
CANISTER SUCT 3000ML PPV (MISCELLANEOUS) ×3 IMPLANT
CLIP VESOCCLUDE MED 6/CT (CLIP) ×3 IMPLANT
CLIP VESOCCLUDE SM WIDE 6/CT (CLIP) ×3 IMPLANT
DERMABOND ADVANCED (GAUZE/BANDAGES/DRESSINGS) ×4
DERMABOND ADVANCED .7 DNX12 (GAUZE/BANDAGES/DRESSINGS) ×1 IMPLANT
DRAPE INCISE IOBAN 66X45 STRL (DRAPES) ×3 IMPLANT
ELECT REM PT RETURN 9FT ADLT (ELECTROSURGICAL) ×3
ELECTRODE REM PT RTRN 9FT ADLT (ELECTROSURGICAL) ×1 IMPLANT
GLOVE BIO SURGEON STRL SZ7 (GLOVE) ×2 IMPLANT
GLOVE BIO SURGEON STRL SZ7.5 (GLOVE) ×5 IMPLANT
GLOVE BIOGEL PI IND STRL 6 (GLOVE) IMPLANT
GLOVE BIOGEL PI IND STRL 6.5 (GLOVE) IMPLANT
GLOVE BIOGEL PI INDICATOR 6 (GLOVE) ×4
GLOVE BIOGEL PI INDICATOR 6.5 (GLOVE) ×6
GOWN STRL REUS W/ TWL LRG LVL3 (GOWN DISPOSABLE) ×2 IMPLANT
GOWN STRL REUS W/ TWL XL LVL3 (GOWN DISPOSABLE) ×1 IMPLANT
GOWN STRL REUS W/TWL LRG LVL3 (GOWN DISPOSABLE) ×9
GOWN STRL REUS W/TWL XL LVL3 (GOWN DISPOSABLE) ×6
GRAFT GORETEX STRT 4-7X45 (Vascular Products) ×2 IMPLANT
HEMOSTAT SNOW SURGICEL 2X4 (HEMOSTASIS) IMPLANT
INSERT FOGARTY SM (MISCELLANEOUS) ×1 IMPLANT
KIT BASIN OR (CUSTOM PROCEDURE TRAY) ×3 IMPLANT
KIT TURNOVER KIT B (KITS) ×3 IMPLANT
NS IRRIG 1000ML POUR BTL (IV SOLUTION) ×3 IMPLANT
PACK CV ACCESS (CUSTOM PROCEDURE TRAY) ×3 IMPLANT
PAD ARMBOARD 7.5X6 YLW CONV (MISCELLANEOUS) ×6 IMPLANT
SUT MNCRL AB 4-0 PS2 18 (SUTURE) IMPLANT
SUT PROLENE 6 0 BV (SUTURE) ×8 IMPLANT
SUT VIC AB 3-0 SH 27 (SUTURE) ×6
SUT VIC AB 3-0 SH 27X BRD (SUTURE) ×2 IMPLANT
TOWEL GREEN STERILE (TOWEL DISPOSABLE) ×3 IMPLANT
UNDERPAD 30X30 (UNDERPADS AND DIAPERS) ×1 IMPLANT
WATER STERILE IRR 1000ML POUR (IV SOLUTION) ×3 IMPLANT

## 2019-02-09 NOTE — Anesthesia Postprocedure Evaluation (Signed)
Anesthesia Post Note  Patient: Joseph REGAS Sr.  Procedure(s) Performed: INSERTION OF ARTERIOVENOUS (AV) GORE-TEX GRAFT LEFT THIGH (Left Leg Upper)     Patient location during evaluation: PACU Anesthesia Type: General Level of consciousness: awake and alert Pain management: pain level controlled Vital Signs Assessment: post-procedure vital signs reviewed and stable Respiratory status: spontaneous breathing, nonlabored ventilation and respiratory function stable Cardiovascular status: blood pressure returned to baseline and stable Postop Assessment: no apparent nausea or vomiting Anesthetic complications: no    Last Vitals:  Vitals:   02/09/19 1110 02/09/19 1140  BP: 119/63 114/61  Pulse: 81 80  Resp: 19 20  Temp:    SpO2: 100% 98%    Last Pain:  Vitals:   02/09/19 1105  TempSrc:   PainSc: 3                  Deedee Lybarger,W. EDMOND

## 2019-02-09 NOTE — Anesthesia Procedure Notes (Signed)
Procedure Name: Intubation Date/Time: 02/09/2019 9:02 AM Performed by: Candis Shine, CRNA Pre-anesthesia Checklist: Patient identified, Emergency Drugs available, Suction available and Patient being monitored Patient Re-evaluated:Patient Re-evaluated prior to induction Oxygen Delivery Method: Circle System Utilized Preoxygenation: Pre-oxygenation with 100% oxygen Induction Type: IV induction and Rapid sequence Laryngoscope Size: Miller and 2 Grade View: Grade I Tube type: Oral Tube size: 7.5 mm Number of attempts: 1 Airway Equipment and Method: Stylet Placement Confirmation: ETT inserted through vocal cords under direct vision,  positive ETCO2 and breath sounds checked- equal and bilateral Secured at: 21 cm Tube secured with: Tape Dental Injury: Teeth and Oropharynx as per pre-operative assessment

## 2019-02-09 NOTE — Op Note (Signed)
    Patient name: Joseph WITUCKI Sr. MRN: [id number] DOB: 05-10-1940 Sex: male  02/09/2019 Pre-operative Diagnosis: End-stage renal disease Post-operative diagnosis:  Same Surgeon:  Eda Paschal. Donzetta Matters, MD Assistant: Ellsworth Lennox, RN Procedure Performed:  Left thigh AV graft with 4-7 mm PTFE  Indications: 78 year old male with bilateral upper extremity failed accesses for which he had steal.  Most recently underwent brachial artery patch angioplasty after graft ligation.  He does have 2 amputations on the right.  He is now indicated for left femoral artery graft.  Findings: Common femoral artery was actually quite calcified.  The vein was free of disease.  At completion there was a strong thrill in the graft.  Graft originates from the common femoral artery.   Procedure:  The patient was identified in the holding area and taken to the operating room where is placed upon operative general anesthesia induced.  Sterilely prepped and draped in the left groin in the usual fashion antibiotics were minister and timeout was called.  We began with transverse incision over the palpable common femoral artery.  We dissected down to this.  Come from artery itself is calcified however upon area was a bit soft there was an area for clamping proximally distally we placed a vessel loop around this.  We dissected out the common femoral vein enough to be able to place a side-biting clamp on it.  We tunneled a 4-7 mm graft with arterial limb lateral.  Patient was given 3000 units of heparin.  We placed side-biting clamp on the vein.  We open the vein longitudinally.  We spatulated the graft sewed into side with 6-0 Prolene suture.  Upon completion we flushed the graft clamped it through a counterincision.  We then clamped the common femoral artery proximally distally opened longitudinally.  We trimmed the graft to size of the 4 mm in and sewed it into side with 6-0 Prolene.  This time prior to completion anastomosis we allowed  flushing all directions.  Upon completion there was a very strong thrill in the graft.  We obtain hemostasis irrigated the wounds.  We closed the groin incision in layers with Vicryl and Monocryl.  The counterincision was closed with erupted Vicryl and Monocryl.  Dermabond placed to both places.  His wife anesthesia having tolerated procedure well without immediate complication.  Counts were correct at completion.  EBL: 50 cc   Phong Isenberg C. Donzetta Matters, MD [facility] Office: [phone] Pager: [phone]

## 2019-02-09 NOTE — Transfer of Care (Signed)
Immediate Anesthesia Transfer of Care Note  Patient: Joseph [place] Sr.  Procedure(s) Performed: INSERTION OF ARTERIOVENOUS (AV) GORE-TEX GRAFT LEFT THIGH (Left Leg Upper)  Patient Location: PACU  Anesthesia Type:General  Level of Consciousness: awake, alert  and oriented  Airway & Oxygen Therapy: Patient Spontanous Breathing and Patient connected to face mask oxygen  Post-op Assessment: Report given to RN and Post -op Vital signs reviewed and stable  Post vital signs: Reviewed and stable  Last Vitals:  Vitals Value Taken Time  BP 155/85 02/09/19 1025  Temp    Pulse 92 02/09/19 1027  Resp 20 02/09/19 1027  SpO2 100 % 02/09/19 1027  Vitals shown include unvalidated device data.  Last Pain:  Vitals:   02/09/19 0721  TempSrc:   PainSc: 0-No pain         Complications: No apparent anesthesia complications

## 2019-02-09 NOTE — H&P (Signed)
   History and Physical Update  The patient was interviewed and re-examined.  The patient's previous History and Physical has been reviewed and is unchanged from recent office visit. Plan for left thigh av graft.   Nelani Schmelzle C. Donzetta Matters, MD [facility] Office: [phone] Pager: [phone]   02/09/2019, 7:28 AM

## 2019-02-10 ENCOUNTER — Encounter (HOSPITAL_COMMUNITY): Payer: Self-pay | Admitting: Vascular Surgery

## 2019-02-10 DIAGNOSIS — F419 Anxiety disorder, unspecified: Secondary | ICD-10-CM | POA: Diagnosis not present

## 2019-02-10 DIAGNOSIS — N2581 Secondary hyperparathyroidism of renal origin: Secondary | ICD-10-CM | POA: Diagnosis not present

## 2019-02-10 DIAGNOSIS — D631 Anemia in chronic kidney disease: Secondary | ICD-10-CM | POA: Diagnosis not present

## 2019-02-10 DIAGNOSIS — N186 End stage renal disease: Secondary | ICD-10-CM | POA: Diagnosis not present

## 2019-02-10 DIAGNOSIS — I350 Nonrheumatic aortic (valve) stenosis: Secondary | ICD-10-CM | POA: Diagnosis not present

## 2019-02-10 DIAGNOSIS — I739 Peripheral vascular disease, unspecified: Secondary | ICD-10-CM | POA: Diagnosis not present

## 2019-02-10 DIAGNOSIS — I120 Hypertensive chronic kidney disease with stage 5 chronic kidney disease or end stage renal disease: Secondary | ICD-10-CM | POA: Diagnosis not present

## 2019-02-10 DIAGNOSIS — Z992 Dependence on renal dialysis: Secondary | ICD-10-CM | POA: Diagnosis not present

## 2019-02-10 LAB — BASIC METABOLIC PANEL
Anion gap: 14 (ref 5–15)
BUN: 27 mg/dL — ABNORMAL HIGH (ref 8–23)
CO2: 21 mmol/L — ABNORMAL LOW (ref 22–32)
Calcium: 8.2 mg/dL — ABNORMAL LOW (ref 8.9–10.3)
Chloride: 99 mmol/L (ref 98–111)
Creatinine, Ser: 9.15 mg/dL — ABNORMAL HIGH (ref 0.61–1.24)
GFR calc Af Amer: 6 mL/min — ABNORMAL LOW (ref >60)
GFR calc non Af Amer: 5 mL/min — ABNORMAL LOW (ref >60)
Glucose, Bld: 100 mg/dL — ABNORMAL HIGH (ref 70–99)
Potassium: 4.7 mmol/L (ref 3.5–5.1)
Sodium: 134 mmol/L — ABNORMAL LOW (ref 135–145)

## 2019-02-10 LAB — CBC
HCT: 40.1 % (ref 39.0–52.0)
Hemoglobin: 13.2 g/dL (ref 13.0–17.0)
MCH: 22 pg — ABNORMAL LOW (ref 26.0–34.0)
MCHC: 32.9 g/dL (ref 30.0–36.0)
MCV: 66.8 fL — ABNORMAL LOW (ref 80.0–100.0)
Platelets: 249 10*3/uL (ref 150–400)
RBC: 6 MIL/uL — ABNORMAL HIGH (ref 4.22–5.81)
RDW: 18.6 % — ABNORMAL HIGH (ref 11.5–15.5)
WBC: 13.9 10*3/uL — ABNORMAL HIGH (ref 4.0–10.5)
nRBC: 0 % (ref 0.0–0.2)

## 2019-02-10 LAB — HEPATITIS B CORE ANTIBODY, TOTAL: Hep B Core Total Ab: NONREACTIVE

## 2019-02-10 MED ORDER — HEPARIN SODIUM (PORCINE) 1000 UNIT/ML DIALYSIS
1000.0000 [IU] | INTRAMUSCULAR | Status: DC | PRN
  Administered 2019-02-10: 17:00:00 1000 [IU] via INTRAVENOUS_CENTRAL
  Filled 2019-02-10: qty 1

## 2019-02-10 MED ORDER — LIDOCAINE-PRILOCAINE 2.5-2.5 % EX CREA: 1.0000 "application " | TOPICAL_CREAM | CUTANEOUS | Status: DC | PRN

## 2019-02-10 MED ORDER — ALTEPLASE 2 MG IJ SOLR: 2.0000 mg | Freq: Once | INTRAMUSCULAR | Status: DC | PRN

## 2019-02-10 MED ORDER — PENTAFLUOROPROP-TETRAFLUOROETH EX AERO: 1.0000 "application " | INHALATION_SPRAY | CUTANEOUS | Status: DC | PRN

## 2019-02-10 MED ORDER — MIDODRINE HCL 5 MG PO TABS
ORAL_TABLET | ORAL | Status: AC
  Administered 2019-02-10: 10 mg
  Filled 2019-02-10: qty 2

## 2019-02-10 MED ORDER — HEPARIN SODIUM (PORCINE) 1000 UNIT/ML IJ SOLN
INTRAMUSCULAR | Status: AC
  Administered 2019-02-10: 1000 [IU] via INTRAVENOUS_CENTRAL
  Filled 2019-02-10: qty 4

## 2019-02-10 MED ORDER — LIDOCAINE HCL (PF) 1 % IJ SOLN: 5.0000 mL | INTRAMUSCULAR | Status: DC | PRN

## 2019-02-10 MED ORDER — DOXERCALCIFEROL 4 MCG/2ML IV SOLN
2.0000 ug | INTRAVENOUS | Status: DC
  Filled 2019-02-10: qty 2

## 2019-02-10 MED ORDER — SODIUM CHLORIDE 0.9 % IV SOLN: 100.0000 mL | INTRAVENOUS | Status: DC | PRN

## 2019-02-10 MED ORDER — OXYCODONE-ACETAMINOPHEN 5-325 MG PO TABS: 1.0000 | ORAL_TABLET | Freq: Four times a day (QID) | ORAL | 0 refills | Status: DC | PRN

## 2019-02-10 MED ORDER — MIDODRINE HCL 5 MG PO TABS
10.0000 mg | ORAL_TABLET | ORAL | Status: DC | PRN
  Filled 2019-02-10: qty 2

## 2019-02-10 NOTE — Discharge Instructions (Signed)
° °  [facility] ° °Discharge Instructions ° °AV Fistula or Graft Surgery for Dialysis Access ° °Please refer to the following instructions for your post-procedure care. Your surgeon or physician assistant will discuss any changes with you. ° °Activity ° °You may drive the day following your surgery, if you are comfortable and no longer taking prescription pain medication. Resume full activity as the soreness in your incision resolves. ° °Bathing/Showering ° °You may shower after you go home. Keep your incision dry for 48 hours. Do not soak in a bathtub, hot tub, or swim until the incision heals completely. You may not shower if you have a hemodialysis catheter. ° °Incision Care ° °Clean your incision with mild soap and water after 48 hours. Pat the area dry with a clean towel. You do not need a bandage unless otherwise instructed. Do not apply any ointments or creams to your incision. You may have skin glue on your incision. Do not peel it off. It will come off on its own in about one week. Your arm may swell a bit after surgery. To reduce swelling use pillows to elevate your arm so it is above your heart. Your doctor will tell you if you need to lightly wrap your arm with an ACE bandage. ° °Diet ° °Resume your normal diet. There are not special food restrictions following this procedure. In order to heal from your surgery, it is CRITICAL to get adequate nutrition. Your body requires vitamins, minerals, and protein. Vegetables are the best source of vitamins and minerals. Vegetables also provide the perfect balance of protein. Processed food has little nutritional value, so try to avoid this. ° °Medications ° °Resume taking all of your medications. If your incision is causing pain, you may take over-the counter pain relievers such as acetaminophen (Tylenol). If you were prescribed a stronger pain medication, please be aware these medications can cause nausea and constipation. Prevent  nausea by taking the medication with a snack or meal. Avoid constipation by drinking plenty of fluids and eating foods with high amount of fiber, such as fruits, vegetables, and grains. Do not take Tylenol if you are taking prescription pain medications. ° ° ° ° °Follow up °Your surgeon may want to see you in the office following your access surgery. If so, this will be arranged at the time of your surgery. ° °Please call us immediately for any of the following conditions: ° °Increased pain, redness, drainage (pus) from your incision site °Fever of 101 degrees or higher °Severe or worsening pain at your incision site °Hand pain or numbness. ° °Reduce your risk of vascular disease: ° °Stop smoking. If you would like help, call [facility] at 1-800-QUIT-NOW (1-800-784-8669) or [facility] at 336-586-4000 ° °Manage your cholesterol °Maintain a desired weight °Control your diabetes °Keep your blood pressure down ° °Dialysis ° °It will take several weeks to several months for your new dialysis access to be ready for use. Your surgeon will determine when it is OK to use it. Your nephrologist will continue to direct your dialysis. You can continue to use your Permcath until your new access is ready for use. ° °If you have any questions, please call the office at [phone]. ° °

## 2019-02-10 NOTE — Care Management CC44 (Signed)
Condition Code 44 Documentation Completed  Patient Details  Name: Joseph RENWICK Sr. MRN: [id number] Date of Birth: 1940-05-05   Condition Code 44 given:  Yes Patient signature on Condition Code 44 notice:  Yes Documentation of 2 MD's agreement:  Yes Code 44 added to claim:  Yes    Dawayne Patricia, RN 02/10/2019, 4:59 PM

## 2019-02-10 NOTE — Progress Notes (Signed)
CSW arranged Transportation for patient to home([place])with [facility] transportation(after hrs # [phone]). They will pick up patient at 7pm.   RN informed.   Thurmond Butts, MSW, [place] Social Worker [phone]

## 2019-02-10 NOTE — Procedures (Signed)
I was present at this dialysis session. I have reviewed the session itself and made appropriate changes.   Vital signs in last 24 hours:  Temp:  [98 F (36.7 C)-98.1 F (36.7 C)] 98 F (36.7 C) (10/14 1315) Pulse Rate:  [61-83] 61 (10/14 1500) Resp:  [14-22] 18 (10/14 1315) BP: (107-130)/(53-69) 119/53 (10/14 1500) SpO2:  [95 %-99 %] 96 % (10/14 1315) Weight:  [74.4 kg-75.1 kg] 75.1 kg (10/14 1315) Weight change:  Filed Weights   02/09/19 0721 02/10/19 0620 02/10/19 1315  Weight: 72.1 kg 74.4 kg 75.1 kg    Recent Labs  Lab 02/10/19 0431  NA 134*  K 4.7  CL 99  CO2 21*  GLUCOSE 100*  BUN 27*  CREATININE 9.15*  CALCIUM 8.2*    Recent Labs  Lab 02/09/19 0801 02/10/19 0431  WBC  --  13.9*  HGB 16.7 13.2  HCT 49.0 40.1  MCV  --  66.8*  PLT  --  249    Scheduled Meds: . atorvastatin  40 mg Oral q1800  . Chlorhexidine Gluconate Cloth  6 each Topical Daily  . cinacalcet  60 mg Oral QPM  . doxercalciferol  2 mcg Intravenous Q M,W,F-HD  . midodrine  10 mg Oral Q M,W,F  . multivitamin  1 tablet Oral QHS  . mupirocin ointment  1 application Nasal BID  . pantoprazole  40 mg Oral Daily  . sevelamer carbonate  3,200 mg Oral TID WC   Continuous Infusions: PRN Meds:.acetaminophen **OR** acetaminophen, alum & mag hydroxide-simeth, bisacodyl, guaiFENesin-dextromethorphan, hydrALAZINE, labetalol, metoprolol tartrate, morphine injection, ondansetron, oxyCODONE-acetaminophen, phenol, polyethylene glycol, sevelamer carbonate   Donetta [place],  MD 02/10/2019, 3:35 PM

## 2019-02-10 NOTE — Progress Notes (Addendum)
  Progress Note    02/10/2019 7:34 AM 1 Day Post-Op  Subjective:  Denies rest pain L foot; normally has dialysis 1st shift at [facility] in [place]:   02/09/19 1936 02/10/19 0620  BP: (!) 130/57 (!) 109/53  Pulse: 83 79  Resp: (!) 22 20  Temp: 98 F (36.7 C) 98 F (36.7 C)  SpO2: 99% 95%   Physical Exam: Lungs:  Non labored Incisions:  L groin and counter incision c/d/i Extremities:  L ATA by doppler; palpable thrill through L thigh AVG Neurologic: A&O  CBC    Component Value Date/Time   WBC 13.9 (H) 02/10/2019 0431   RBC 6.00 (H) 02/10/2019 0431   HGB 13.2 02/10/2019 0431   HCT 40.1 02/10/2019 0431   PLT 249 02/10/2019 0431   MCV 66.8 (L) 02/10/2019 0431   MCH 22.0 (L) 02/10/2019 0431   MCHC 32.9 02/10/2019 0431   RDW 18.6 (H) 02/10/2019 0431   LYMPHSABS 1.3 08/02/2018 0525   MONOABS 0.5 08/02/2018 0525   EOSABS 0.5 08/02/2018 0525   BASOSABS 0.1 08/02/2018 0525    BMET    Component Value Date/Time   NA 134 (L) 02/10/2019 0431   K 4.7 02/10/2019 0431   CL 99 02/10/2019 0431   CO2 21 (L) 02/10/2019 0431   GLUCOSE 100 (H) 02/10/2019 0431   BUN 27 (H) 02/10/2019 0431   CREATININE 9.15 (H) 02/10/2019 0431   CREATININE 10.80 (H) 07/06/2015 1158   CALCIUM 8.2 (L) 02/10/2019 0431   GFRNONAA 5 (L) 02/10/2019 0431   GFRAA 6 (L) 02/10/2019 0431    INR    Component Value Date/Time   INR 1.1 12/01/2018 1538     Intake/Output Summary (Last 24 hours) at 02/10/2019 0734 Last data filed at 02/09/2019 1600 Gross per 24 hour  Intake 676.67 ml  Output 25 ml  Net 651.67 ml     Assessment/Plan:  78 y.o. male is s/p L thigh AVG 1 Day Post-Op   Patent L thigh graft with palpable thrill Perfusing LLE with brisk L ATA signal Given that patient is 1st shift HD in [place], I will ask Nephrology about plans for next treatment   Dagoberto Ligas, PA-C [facility] [phone] 02/10/2019 7:34 AM  I have independently interviewed  and examined patient and agree with PA assessment and plan above. Appreciate nephrology assistance. Will dc home after dialysis.   Brandon C. Donzetta Matters, MD [facility] [place] Office: [phone] Pager: [phone]

## 2019-02-10 NOTE — Care Management Obs Status (Signed)
[place] NOTIFICATION   Patient Details  Name: Joseph GOETZE Sr. MRN: [id number] Date of Birth: Aug 10, 1940   Medicare Observation Status Notification Given:  Yes    Dawayne Patricia, RN 02/10/2019, 4:59 PM

## 2019-02-10 NOTE — Discharge Summary (Signed)
Physician Discharge Summary   Patient ID: [name] [id number] 78 y.o. Feb 19, 1941  Admit date: 02/09/2019  Discharge date and time: 02/10/19   Admitting Physician: Waynetta Sandy, MD   Discharge Physician: same  Admission Diagnoses: ESRD  Discharge Diagnoses: same  Admission Condition: fair  Discharged Condition: fair  Indication for Admission: Dialysis access placement  Hospital Course: [name] is a 78 year old male who came in as an outpatient for left thigh AV graft placement by Dr. Donzetta Matters on 02/09/2019.  He tolerated the procedure well and was admitted overnight.  Due to overnight hospitalization he missed his first shift hemodialysis treatment at [facility] in [place] and thus nephrology was consulted for planning for next HD treatment.  He was seen by nephrology and scheduled for inpatient dialysis.  He will be discharged home after inpatient HD treatment.  He will follow-up in office in about 3 weeks for incision check.  Left thigh graft will be able to be used in about 4 weeks.  He was prescribed 2 to 3 days of narcotic pain medication for continued postoperative pain control.  Discharge instructions were reviewed with the patient and he voices his understanding.  It should also be noted that patient maintained a brisk left anterior tibial artery signal by Doppler.  He will be discharged home after dialysis in stable condition.  Consults: nephrology  Treatments: surgery: Left thigh AV graft by Dr. Donzetta Matters on 02/09/2019  Discharge Exam: See progress note 02/10/2019 Vitals:   02/10/19 1430 02/10/19 1500  BP: 125/69 (!) 119/53  Pulse: 78 61  Resp:    Temp:    SpO2:      Wound Disposition: Discharge disposition: 01-Home or Self Care       Patient Instructions:  Allergies as of 02/10/2019      Reactions   Aspirin Other (See Comments)   Causes internal bleeding  History of ulcers      Medication List    STOP taking these medications    nitroGLYCERIN 0.2 mg/hr patch Commonly known as: NITRODUR - Dosed in mg/24 hr     TAKE these medications   atorvastatin 40 MG tablet Commonly known as: LIPITOR Take 40 mg by mouth daily.   b complex-vitamin c-folic acid 0.8 MG Tabs tablet Take 1 tablet by mouth See admin instructions. Take one tablet daily on Tuesdays, Thursdays, Saturdays, and Sundays.   cholecalciferol 25 MCG (1000 UT) tablet Commonly known as: VITAMIN D3 Take 1,000 Units by mouth daily.   cinacalcet 60 MG tablet Commonly known as: SENSIPAR Take 60 mg by mouth every evening.   midodrine 10 MG tablet Commonly known as: PROAMATINE Take 10 mg by mouth every Monday, Wednesday, and Friday. before dialysis.   oxyCODONE-acetaminophen 5-325 MG tablet Commonly known as: PERCOCET/ROXICET Take 1 tablet by mouth every 6 (six) hours as needed for moderate pain.   sevelamer carbonate 800 MG tablet Commonly known as: RENVELA Take 1,600-3,200 mg by mouth See admin instructions. Take 3200  mg by mouth 3 times daily with full meals and take 1600 mg by mouth with snacks.   vitamin C 500 MG tablet Commonly known as: ASCORBIC ACID Take 500 mg by mouth daily.      Activity: activity as tolerated Diet: renal diet Wound Care: keep wound clean and dry  Follow-up with Dr. Donzetta Matters in 3 weeks.  SignedDagoberto Ligas 02/10/2019 3:32 PM

## 2019-02-10 NOTE — Consult Note (Signed)
[facility] Renal Consultation Note    Indication for Consultation:  Management of ESRD/hemodialysis; anemia, hypertension/volume and secondary hyperparathyroidism  HPI: Joseph HARTY Sr. is a 78 y.o. 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 Donzetta Matters.  We have been asked to provide HD since he was not able to make his outpatient appointment this morning.  Plans are to discharge him to home after HD and f/u with his outpatient unit on Friday.  He tolerated the surgery well.    Past Medical History:  Diagnosis Date  . Anxiety   . Aortic stenosis   . Arthritis    left hand  . CVA (cerebral infarction)   . ESRD (end stage renal disease) on dialysis [facility])    M/W/F at [facility] in [place]  . Essential hypertension    resolved with HD  . Gangrene of right foot ([place])   . Gastric ulcer 2004  . GI bleed    gastric ulcer  . Heart murmur   . History of blood transfusion   . History of cardiomyopathy    LVEF normal as of February 2017  . History of gastric ulcer   . History of stroke    Left side weakness  . Iron deficiency anemia   . Peripheral vascular disease ([place])   . Stroke [facility]) 2000   limp left   Past Surgical History:  Procedure Laterality Date  . ABDOMINAL AORTAGRAM N/A 01/24/2012   Procedure: ABDOMINAL Maxcine Ham;  Surgeon: Elam Dutch, MD;  Location: [facility] CATH LAB;  Service: Cardiovascular;  Laterality: N/A;  . ABDOMINAL AORTOGRAM W/LOWER EXTREMITY N/A 04/15/2017   Procedure: ABDOMINAL AORTOGRAM W/LOWER EXTREMITY;  Surgeon: Angelia Mould, MD;  Location: [place] CV LAB;  Service: Cardiovascular;  Laterality: N/A;  . AMPUTATION Right 05/09/2017   Procedure: RIGHT TRANSMETATARSAL AMPUTATION;  Surgeon: [name], MD;  Location: [place];  Service: Orthopedics;   Laterality: Right;  . AMPUTATION Right 06/13/2018   Procedure: AMPUTATION RIGHT LONG FINGER;  Surgeon: Dayna Barker, MD;  Location: [place];  Service: Plastics;  Laterality: Right;  . ARTERIOVENOUS GRAFT PLACEMENT Right right arm  . AV FISTULA PLACEMENT Left 08/31/2015   Procedure: ARTERIOVENOUS (AV) FISTULA CREATION- LEFT RADIOCEPHALIC;  Surgeon: Mal Misty, MD;  Location: [place];  Service: Vascular;  Laterality: Left;  . AV FISTULA PLACEMENT Right 02/18/2017   Procedure: INSERTION OF ARTERIOVENOUS (AV) GORE-TEX GRAFT  RIGHT UPPER ARM;  Surgeon: Conrad Bradley Gardens, MD;  Location: [place];  Service: Vascular;  Laterality: Right;  . AV FISTULA PLACEMENT Left 12/01/2018   Procedure: INSERTION OF ARTERIOVENOUS (AV) GORE-TEX GRAFT ARM;  Surgeon: Angelia Mould, MD;  Location: [place];  Service: Vascular;  Laterality: Left;  . BASCILIC VEIN TRANSPOSITION Left 12/19/2015   Procedure: FIRST STAGE BRACHIAL VEIN TRANSPOSITION;  Surgeon: Conrad Sandyfield, MD;  Location: [place];  Service: Vascular;  Laterality: Left;  . BASCILIC VEIN TRANSPOSITION Left 02/08/2016   Procedure: SECOND STAGE BRACHIAL VEIN TRANSPOSITION;  Surgeon: Conrad Delaware, MD;  Location: [place];  Service: Vascular;  Laterality: Left;  . CARDIAC CATHETERIZATION N/A 07/11/2015   Procedure: Left Heart Cath and Coronary Angiography;  Surgeon: Troy Sine, MD;  Location: [place] CV LAB;  Service: Cardiovascular;  Laterality: N/A;  .  Carpel Tunnel Left Dec. 22, 2016  . CHOLECYSTECTOMY    . COLONOSCOPY  2004   Dr. Irving Shows, left sided diverticula and cecal polyp, path unknown  . COLONOSCOPY  10/29/2011   Procedure: COLONOSCOPY;  Surgeon: Daneil Dolin, MD;  [facility];  Service: Endoscopy;  Laterality: N/A;  10:15  . ESOPHAGOGASTRODUODENOSCOPY  11/2002   Dr. Gala Romney, erosive reflux esophagitis, multiple gastric ulcer and antral/bulbar erosions. Serologies positive for H.Pylori and was treated  . ESOPHAGOGASTRODUODENOSCOPY  11/20014    Dr. Gala Romney, small hh only, ulcers healed  . ESOPHAGOGASTRODUODENOSCOPY  09/21/2011   Dr Trevor Iha [facility], antral erosions, ?early GAVE  . FISTULOGRAM Left 12/10/2016   Procedure: THROMBECTOMY OF LEFT ARM ARTERIOVENOUS FISTULA;  Surgeon: Waynetta Sandy, MD;  Location: [place];  Service: Vascular;  Laterality: Left;  . INSERTION OF DIALYSIS CATHETER Left 12/10/2016   Procedure: INSERTION OF TUNNELED DIALYSIS CATHETER;  Surgeon: Waynetta Sandy, MD;  Location: [place];  Service: Vascular;  Laterality: Left;  . INSERTION OF DIALYSIS CATHETER Right 06/09/2018   Procedure: INSERTION OF DIALYSIS CATHETER Right subclavian;  Surgeon: Angelia Mould, MD;  Location: [place];  Service: Vascular;  Laterality: Right;  . IR DIALY SHUNT INTRO [place] W/IMG RIGHT Right 01/01/2018  . IR GENERIC HISTORICAL  07/16/2016   IR REMOVAL TUN CV CATH W/O FL 07/16/2016 Saverio Danker, PA-C [facility]-INTERV RAD  . IR PTA ADDL CENTRAL DIALYSIS SEG THRU DIALY CIRCUIT RIGHT Right 10/21/2017  . IR REMOVAL TUN CV CATH W/O FL  05/12/2017  . IR THROMBECTOMY AV FISTULA W/THROMBOLYSIS/PTA INC/SHUNT/IMG RIGHT Right 10/21/2017  . IR THROMBECTOMY AV FISTULA W/THROMBOLYSIS/PTA INC/SHUNT/IMG RIGHT Right 11/27/2017  . IR US GUIDE VASC ACCESS RIGHT  10/21/2017  . IR US GUIDE VASC ACCESS RIGHT  11/27/2017  . IR US GUIDE VASC ACCESS RIGHT  01/01/2018  . LIGATION ARTERIOVENOUS GORTEX GRAFT Right 06/09/2018  . LIGATION ARTERIOVENOUS GORTEX GRAFT Right 06/09/2018   Procedure: LIGATION ARTERIOVENOUS GORTEX GRAFT RIGHT ARM;  Surgeon: Angelia Mould, MD;  Location: [place];  Service: Vascular;  Laterality: Right;  . LIGATION ARTERIOVENOUS GORTEX GRAFT Left 12/31/2018   Procedure: LIGATION OF LEFT UPPER ARM ARTERIVENOUS GORTEX GRAFT, LEFT BRACHIAL ARTERY ENDARTERECTOMY WITH BOVINE PATCH ANGIOPLASTY;  Surgeon: Waynetta Sandy, MD;  Location: [place];  Service: Vascular;  Laterality: Left;  . LIGATION OF ARTERIOVENOUS   FISTULA Left 12/19/2015   Procedure: LIGATION OF RADIOCEPHALIC ARTERIOVENOUS  FISTULA;  Surgeon: Conrad Garwin, MD;  Location: [place];  Service: Vascular;  Laterality: Left;  Marland Kitchen MASS EXCISION Right 02/18/2017   Procedure: EXCISION OF RIGHT AXILLARY EPIDERMAL INCLUSION CYST;  Surgeon: Conrad Jourdanton, MD;  Location: [place];  Service: Vascular;  Laterality: Right;  . PERIPHERAL VASCULAR CATHETERIZATION N/A 12/14/2015   Procedure: Fistulagram;  Surgeon: Conrad Tustin, MD;  Location: [place] CV LAB;  Service: Cardiovascular;  Laterality: N/A;  . SHOULDER SURGERY Right    fracture  . ULTRASOUND GUIDANCE FOR VASCULAR ACCESS  04/15/2017   Procedure: Ultrasound Guidance For Vascular Access;  Surgeon: Angelia Mould, MD;  Location: [place] CV LAB;  Service: Cardiovascular;;  . UPPER EXTREMITY VENOGRAPHY Bilateral 12/17/2016   Procedure: Bilateral Upper Extremity Venography;  Surgeon: Serafina Mitchell, MD;  Location: [place] CV LAB;  Service: Cardiovascular;  Laterality: Bilateral;  . UPPER EXTREMITY VENOGRAPHY Left 11/03/2018   Procedure: UPPER EXTREMITY VENOGRAPHY;  Surgeon: Serafina Mitchell, MD;  Location: [place] CV LAB;  Service: Cardiovascular;  Laterality:  Left;   Family History:   Family History  Problem Relation Age of Onset  . Hypertension Mother   . Colon cancer Neg Hx   . Liver disease Neg Hx    Social History:  reports that he quit smoking about 14 years ago. He quit after 25.00 years of use. He quit smokeless tobacco use about 32 years ago.  His smokeless tobacco use included chew. He reports that he does not drink alcohol or use drugs. Allergies  Allergen Reactions  . Aspirin Other (See Comments)    Causes internal bleeding  History of ulcers   Prior to Admission medications   Medication Sig Start Date End Date Taking? Authorizing Provider  atorvastatin (LIPITOR) 40 MG tablet Take 40 mg by mouth daily. 11/03/15  Yes [provider]  b complex-vitamin c-folic  acid (NEPHRO-VITE) 0.8 MG TABS Take 1 tablet by mouth See admin instructions. Take one tablet daily on Tuesdays, Thursdays, Saturdays, and Sundays.   Yes [provider]  cholecalciferol (VITAMIN D3) 25 MCG (1000 UT) tablet Take 1,000 Units by mouth daily.   Yes [provider]  cinacalcet (SENSIPAR) 60 MG tablet Take 60 mg by mouth every evening.    Yes [provider]  midodrine (PROAMATINE) 10 MG tablet Take 10 mg by mouth every Monday, Wednesday, and Friday. before dialysis. 09/30/18  Yes [provider]  sevelamer (RENVELA) 800 MG tablet Take 1,600-3,200 mg by mouth See admin instructions. Take 3200  mg by mouth 3 times daily with full meals and take 1600 mg by mouth with snacks.   Yes [provider]  vitamin C (ASCORBIC ACID) 500 MG tablet Take 500 mg by mouth daily.   Yes [provider]  nitroGLYCERIN (NITRODUR - DOSED IN MG/24 HR) 0.2 mg/hr patch Place 1 patch (0.2 mg total) onto the skin daily. Apply 1 patch to dorsum of foot daily. Patient not taking: Reported on 01/29/2019 05/05/18   Rayburn, Neta Mends, PA-C  oxyCODONE-acetaminophen (PERCOCET/ROXICET) 5-325 MG tablet Take 1 tablet by mouth every 6 (six) hours as needed for moderate pain. 02/10/19   Dagoberto Ligas, PA-C   Current Facility-Administered Medications  Medication Dose Route Frequency Provider Last Rate Last Dose  . acetaminophen (TYLENOL) tablet 325-650 mg  325-650 mg Oral Q4H PRN Rhyne, Samantha J, PA-C       Or  . acetaminophen (TYLENOL) suppository 325-650 mg  325-650 mg Rectal Q4H PRN Rhyne, Samantha J, PA-C      . alum & mag hydroxide-simeth (MAALOX/MYLANTA) 200-200-20 MG/5ML suspension 15-30 mL  15-30 mL Oral Q2H PRN Rhyne, Samantha J, PA-C      . atorvastatin (LIPITOR) tablet 40 mg  40 mg Oral q1800 Rhyne, Samantha J, PA-C   40 mg at 02/09/19 1640  . bisacodyl (DULCOLAX) suppository 10 mg  10 mg Rectal Daily PRN Rhyne, Hulen Shouts, PA-C      . Chlorhexidine  Gluconate Cloth 2 % PADS 6 each  6 each Topical Daily Waynetta Sandy, MD   6 each at 02/10/19 [phone]  . cinacalcet (SENSIPAR) tablet 60 mg  60 mg Oral QPM Rhyne, Samantha J, PA-C   60 mg at 02/09/19 1640  . doxercalciferol ([name]) injection 2 mcg  2 mcg Intravenous Q M,W,F-HD Valentina Gu, NP      . guaiFENesin-dextromethorphan (ROBITUSSIN DM) 100-10 MG/5ML syrup 15 mL  15 mL Oral Q4H PRN Rhyne, Samantha J, PA-C      . hydrALAZINE (APRESOLINE) injection 5 mg  5 mg Intravenous  Q20 Min PRN Rhyne, Hulen Shouts, PA-C      . labetalol (NORMODYNE) injection 10 mg  10 mg Intravenous Q10 min PRN Rhyne, Samantha J, PA-C      . metoprolol tartrate (LOPRESSOR) injection 2-5 mg  2-5 mg Intravenous Q2H PRN Rhyne, Samantha J, PA-C      . midodrine (PROAMATINE) tablet 10 mg  10 mg Oral Q M,W,F Rhyne, Samantha J, PA-C   10 mg at 02/10/19 9379  . morphine 2 MG/ML injection 2 mg  2 mg Intravenous Q2H PRN Rhyne, Samantha J, PA-C      . multivitamin (RENA-VIT) tablet 1 tablet  1 tablet Oral QHS Rhyne, Hulen Shouts, PA-C   1 tablet at 02/09/19 2119  . mupirocin ointment (BACTROBAN) 2 % 1 application  1 application Nasal BID Waynetta Sandy, MD   1 application at 02/40/97 [phone]  . ondansetron (ZOFRAN) injection 4 mg  4 mg Intravenous Q6H PRN Rhyne, Samantha J, PA-C      . oxyCODONE-acetaminophen (PERCOCET/ROXICET) 5-325 MG per tablet 1-2 tablet  1-2 tablet Oral Q4H PRN Rhyne, Hulen Shouts, PA-C   2 tablet at 02/10/19 0140  . pantoprazole (PROTONIX) EC tablet 40 mg  40 mg Oral Daily Rhyne, Samantha J, PA-C   40 mg at 02/10/19 0827  . phenol (CHLORASEPTIC) mouth spray 1 spray  1 spray Mouth/Throat PRN Rhyne, Samantha J, PA-C      . polyethylene glycol (MIRALAX / GLYCOLAX) packet 17 g  17 g Oral Daily PRN Rhyne, Samantha J, PA-C      . sevelamer carbonate (RENVELA) tablet 1,600 mg  1,600 mg Oral TID PRN Waynetta Sandy, MD      . sevelamer carbonate (RENVELA) tablet 3,200 mg  3,200 mg Oral  TID WC Rhyne, Samantha J, PA-C   3,200 mg at 02/10/19 0827   Labs: Basic Metabolic Panel: Recent Labs  Lab 02/09/19 0801 02/10/19 0431  NA 137 134*  K 3.6 4.7  CL 98 99  CO2  --  21*  GLUCOSE 80 100*  BUN 21 27*  CREATININE 7.40* 9.15*  CALCIUM  --  8.2*   Liver Function Tests: No results for input(s): AST, ALT, ALKPHOS, BILITOT, PROT, ALBUMIN in the last 168 hours. No results for input(s): LIPASE, AMYLASE in the last 168 hours. No results for input(s): AMMONIA in the last 168 hours. CBC: Recent Labs  Lab 02/09/19 0801 02/10/19 0431  WBC  --  13.9*  HGB 16.7 13.2  HCT 49.0 40.1  MCV  --  66.8*  PLT  --  249   Cardiac Enzymes: No results for input(s): CKTOTAL, CKMB, CKMBINDEX, TROPONINI in the last 168 hours. CBG: Recent Labs  Lab 02/09/19 1027  GLUCAP 111*   Iron Studies: No results for input(s): IRON, TIBC, TRANSFERRIN, FERRITIN in the last 72 hours. Studies/Results: No results found.  ROS: A comprehensive review of systems was negative. Physical Exam: Vitals:   02/10/19 1330 02/10/19 1400 02/10/19 1430 02/10/19 1500  BP: 130/61 107/62 125/69 (!) 119/53  Pulse: 69 76 78 61  Resp:      Temp:      TempSrc:      SpO2:      Weight:      Height:          Weight change:   Intake/Output Summary (Last 24 hours) at 02/10/2019 1537 Last data filed at 02/09/2019 1600 Gross per 24 hour  Intake 56.67 ml  Output -  Net 56.67 ml   BP Marland Kitchen)  119/53   Pulse 61   Temp 98 F (36.7 C) (Oral)   Resp 18   Ht 5\' 5"  (1.651 m)   Wt 75.1 kg   SpO2 96%   BMI 27.55 kg/m  General appearance: alert, cooperative and no distress Head: Normocephalic, without obvious abnormality, atraumatic Resp: clear to auscultation bilaterally Cardio: regular rate and rhythm, S1, S2 normal, no murmur, click, rub or gallop GI: soft, non-tender; bowel sounds normal; no masses,  no organomegaly Extremities: no edema, Left thigh AVG +T/B Dialysis Access:  Dialysis Orders: [facility]  on MWF . EDW 72kg HD [place] 2K/2.5Ca  Time 4 hours Heparin 2000 units. Access RIJ TDC BFR 400 DFR 800    Hectoral 2 mcg IV/tx Midodrine 10 mg prior to HD   Assessment/Plan: 1.  Vascular access- s/p L thigh AVG 2.  ESRD -  Continue with HD qMWF 3.  Hypertension/volume  - stable 4.  Anemia  - stable no epo 5.  Metabolic bone disease -   stable 6.  Nutrition -  Renal diet 7. Disposition- for discharge after HD today.  Donetta Potts, MD [place] Pager [phone] 02/10/2019, 3:37 PM

## 2019-02-11 LAB — HEPATITIS B E ANTIGEN: Hep B E Ag: NEGATIVE

## 2019-02-12 DIAGNOSIS — D509 Iron deficiency anemia, unspecified: Secondary | ICD-10-CM | POA: Diagnosis not present

## 2019-02-12 DIAGNOSIS — N186 End stage renal disease: Secondary | ICD-10-CM | POA: Diagnosis not present

## 2019-02-12 DIAGNOSIS — Z23 Encounter for immunization: Secondary | ICD-10-CM | POA: Diagnosis not present

## 2019-02-12 DIAGNOSIS — N2581 Secondary hyperparathyroidism of renal origin: Secondary | ICD-10-CM | POA: Diagnosis not present

## 2019-02-12 DIAGNOSIS — Z992 Dependence on renal dialysis: Secondary | ICD-10-CM | POA: Diagnosis not present

## 2019-02-12 LAB — HEPATITIS B E ANTIBODY: Hep B E Ab: NEGATIVE

## 2019-02-15 DIAGNOSIS — N2581 Secondary hyperparathyroidism of renal origin: Secondary | ICD-10-CM | POA: Diagnosis not present

## 2019-02-15 DIAGNOSIS — N186 End stage renal disease: Secondary | ICD-10-CM | POA: Diagnosis not present

## 2019-02-15 DIAGNOSIS — D509 Iron deficiency anemia, unspecified: Secondary | ICD-10-CM | POA: Diagnosis not present

## 2019-02-15 DIAGNOSIS — Z23 Encounter for immunization: Secondary | ICD-10-CM | POA: Diagnosis not present

## 2019-02-15 DIAGNOSIS — Z992 Dependence on renal dialysis: Secondary | ICD-10-CM | POA: Diagnosis not present

## 2019-02-17 DIAGNOSIS — Z23 Encounter for immunization: Secondary | ICD-10-CM | POA: Diagnosis not present

## 2019-02-17 DIAGNOSIS — N2581 Secondary hyperparathyroidism of renal origin: Secondary | ICD-10-CM | POA: Diagnosis not present

## 2019-02-17 DIAGNOSIS — Z992 Dependence on renal dialysis: Secondary | ICD-10-CM | POA: Diagnosis not present

## 2019-02-17 DIAGNOSIS — D509 Iron deficiency anemia, unspecified: Secondary | ICD-10-CM | POA: Diagnosis not present

## 2019-02-17 DIAGNOSIS — N186 End stage renal disease: Secondary | ICD-10-CM | POA: Diagnosis not present

## 2019-02-18 ENCOUNTER — Ambulatory Visit (INDEPENDENT_AMBULATORY_CARE_PROVIDER_SITE_OTHER): Payer: Medicare Other | Admitting: Orthopedic Surgery

## 2019-02-18 ENCOUNTER — Other Ambulatory Visit: Payer: Self-pay

## 2019-02-18 VITALS — Ht 65.0 in | Wt 149.0 lb

## 2019-02-18 DIAGNOSIS — Z89431 Acquired absence of right foot: Secondary | ICD-10-CM | POA: Diagnosis not present

## 2019-02-18 DIAGNOSIS — I779 Disorder of arteries and arterioles, unspecified: Secondary | ICD-10-CM

## 2019-02-19 DIAGNOSIS — D509 Iron deficiency anemia, unspecified: Secondary | ICD-10-CM | POA: Diagnosis not present

## 2019-02-19 DIAGNOSIS — N186 End stage renal disease: Secondary | ICD-10-CM | POA: Diagnosis not present

## 2019-02-19 DIAGNOSIS — Z992 Dependence on renal dialysis: Secondary | ICD-10-CM | POA: Diagnosis not present

## 2019-02-19 DIAGNOSIS — Z23 Encounter for immunization: Secondary | ICD-10-CM | POA: Diagnosis not present

## 2019-02-19 DIAGNOSIS — N2581 Secondary hyperparathyroidism of renal origin: Secondary | ICD-10-CM | POA: Diagnosis not present

## 2019-02-22 DIAGNOSIS — N186 End stage renal disease: Secondary | ICD-10-CM | POA: Diagnosis not present

## 2019-02-22 DIAGNOSIS — D509 Iron deficiency anemia, unspecified: Secondary | ICD-10-CM | POA: Diagnosis not present

## 2019-02-22 DIAGNOSIS — Z23 Encounter for immunization: Secondary | ICD-10-CM | POA: Diagnosis not present

## 2019-02-22 DIAGNOSIS — Z992 Dependence on renal dialysis: Secondary | ICD-10-CM | POA: Diagnosis not present

## 2019-02-22 DIAGNOSIS — N2581 Secondary hyperparathyroidism of renal origin: Secondary | ICD-10-CM | POA: Diagnosis not present

## 2019-02-23 ENCOUNTER — Encounter: Payer: Self-pay | Admitting: Orthopedic Surgery

## 2019-02-23 DIAGNOSIS — N2581 Secondary hyperparathyroidism of renal origin: Secondary | ICD-10-CM | POA: Diagnosis not present

## 2019-02-23 DIAGNOSIS — Z992 Dependence on renal dialysis: Secondary | ICD-10-CM | POA: Diagnosis not present

## 2019-02-23 DIAGNOSIS — N186 End stage renal disease: Secondary | ICD-10-CM | POA: Diagnosis not present

## 2019-02-23 DIAGNOSIS — Z23 Encounter for immunization: Secondary | ICD-10-CM | POA: Diagnosis not present

## 2019-02-23 DIAGNOSIS — D509 Iron deficiency anemia, unspecified: Secondary | ICD-10-CM | POA: Diagnosis not present

## 2019-02-23 NOTE — Progress Notes (Signed)
Office Visit Note   Patient: Joseph BELLANCA Sr.           Date of Birth: 1940-07-16           MRN: [id number] Visit Date: 02/18/2019              Requested by: [name], MD [address] [place],  [place] [address] PCP: [name], MD  Chief Complaint  Patient presents with  . Right Foot - Follow-up      HPI: Patient is a 78 year old gentleman status post right transmetatarsal amputation.  Patient has received a prescription for extra-depth shoes orthotics and spacer and carbon plate.  Patient states he has not followed up for his orthotic fitting.  Patient denies pain currently walks with a cane.  Assessment & Plan: Visit Diagnoses:  1. S/P transmetatarsal amputation of foot, right ([place])     Plan: Patient will follow up with the orthotist for orthotic spacer and carbon plate.  Follow-Up Instructions: Return in about 4 weeks (around 03/18/2019).   Ortho Exam  Patient is alert, oriented, no adenopathy, well-dressed, normal affect, normal respiratory effort. Examination patient has good dorsiflexion ankle about 10 degrees past neutral there are some mild calluses no ulcers no cellulitis no signs of infection.  Imaging: No results found. No images are attached to the encounter.  Labs: Lab Results  Component Value Date   REPTSTATUS 06/17/2018 FINAL 06/12/2018   GRAMSTAIN  02/18/2017    RARE WBC PRESENT, PREDOMINANTLY PMN FEW GRAM POSITIVE COCCI    CULT  06/12/2018    NO GROWTH 5 DAYS Performed at [facility], [address], [place], [place] [address]      Lab Results  Component Value Date   ALBUMIN 2.8 (L) 12/02/2018   ALBUMIN 3.0 (L) 12/01/2018   ALBUMIN 1.9 (L) 06/16/2018    Lab Results  Component Value Date   MG 2.0 06/17/2018   MG 1.9 06/14/2018   MG 1.8 06/13/2018   No results found for: VD25OH  No results found for: PREALBUMIN CBC EXTENDED Latest Ref Rng & Units 02/10/2019 02/09/2019 12/31/2018  WBC 4.0 - 10.5 K/uL 13.9(H) - 7.3   RBC 4.22 - 5.81 MIL/uL 6.00(H) - 6.58(H)  HGB 13.0 - 17.0 g/dL 13.2 16.7 13.8  HCT 39.0 - 52.0 % 40.1 49.0 44.3  PLT 150 - 400 K/uL 249 - 154  NEUTROABS 1.7 - 7.7 K/uL - - -  LYMPHSABS 0.7 - 4.0 K/uL - - -     Body mass index is 24.79 kg/m.  Orders:  No orders of the defined types were placed in this encounter.  No orders of the defined types were placed in this encounter.    Procedures: No procedures performed  Clinical Data: No additional findings.  ROS:  All other systems negative, except as noted in the HPI. Review of Systems  Objective: Vital Signs: Ht 5\' 5"  (1.651 m)   Wt 149 lb (67.6 kg)   BMI 24.79 kg/m   Specialty Comments:  No specialty comments available.  PMFS History: Patient Active Problem List   Diagnosis Date Noted  . Malnutrition of moderate degree 06/18/2018  . Severe sepsis ([place]) 06/12/2018  . Leukocytosis 06/12/2018  . MRSA (methicillin resistant staph aureus) culture positive 06/12/2018  . Essential hypertension 06/12/2018  . ESRD (end stage renal disease) ([place]) 06/09/2018  . Gangrene of finger ([place]) 06/04/2018  . S/P transmetatarsal amputation of foot, right ([place]) 05/09/2017  . PVD (peripheral vascular disease) ([place]) 04/15/2017  .  Nonischemic cardiomyopathy ([place]) 08/15/2015  . Aortic stenosis 08/15/2015  . Moderate aortic stenosis 07/12/2015  . 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 CAD- 40-50% LAD at cath 07/11/15 07/11/2015  . Aspirin intolerance 07/11/2015  . Abnormal stress test   . ESRD on dialysis ([place]) 05/30/2015  . CTS (carpal tunnel syndrome) 02/28/2015  . PVD of LE - Dr Oneida Alar follows 08/20/2012  . Midfoot skin ulcer, right, limited to breakdown of skin ([place]) 01/16/2012  . Encounter for screening colonoscopy 10/08/2011  . Melena 10/08/2011  . Other complications due to renal dialysis device, implant, and graft 09/24/2011   Past Medical History:  Diagnosis Date  . Anxiety   . Aortic stenosis   .  Arthritis    left hand  . CVA (cerebral infarction)   . ESRD (end stage renal disease) on dialysis [facility])    M/W/F at [facility] in [place]  . Essential hypertension    resolved with HD  . Gangrene of right foot ([place])   . Gastric ulcer 2004  . GI bleed    gastric ulcer  . Heart murmur   . History of blood transfusion   . History of cardiomyopathy    LVEF normal as of February 2017  . History of gastric ulcer   . History of stroke    Left side weakness  . Iron deficiency anemia   . Peripheral vascular disease ([place])   . Stroke [facility]) 2000   limp left    Family History  Problem Relation Age of Onset  . Hypertension Mother   . Colon cancer Neg Hx   . Liver disease Neg Hx     Past Surgical History:  Procedure Laterality Date  . ABDOMINAL AORTAGRAM N/A 01/24/2012   Procedure: ABDOMINAL Maxcine Ham;  Surgeon: Elam Dutch, MD;  Location: [facility] CATH LAB;  Service: Cardiovascular;  Laterality: N/A;  . ABDOMINAL AORTOGRAM W/LOWER EXTREMITY N/A 04/15/2017   Procedure: ABDOMINAL AORTOGRAM W/LOWER EXTREMITY;  Surgeon: Angelia Mould, MD;  Location: [place] CV LAB;  Service: Cardiovascular;  Laterality: N/A;  . AMPUTATION Right 05/09/2017   Procedure: RIGHT TRANSMETATARSAL AMPUTATION;  Surgeon: [name], MD;  Location: [place];  Service: Orthopedics;  Laterality: Right;  . AMPUTATION Right 06/13/2018   Procedure: AMPUTATION RIGHT LONG FINGER;  Surgeon: Dayna Barker, MD;  Location: [place];  Service: Plastics;  Laterality: Right;  . ARTERIOVENOUS GRAFT PLACEMENT Right right arm  . AV FISTULA PLACEMENT Left 08/31/2015   Procedure: ARTERIOVENOUS (AV) FISTULA CREATION- LEFT RADIOCEPHALIC;  Surgeon: Mal Misty, MD;  Location: [place];  Service: Vascular;  Laterality: Left;  . AV FISTULA PLACEMENT Right 02/18/2017   Procedure: INSERTION OF ARTERIOVENOUS (AV) GORE-TEX GRAFT  RIGHT UPPER ARM;  Surgeon: Conrad Woodbridge, MD;  Location: [place];  Service: Vascular;  Laterality: Right;  . AV  FISTULA PLACEMENT Left 12/01/2018   Procedure: INSERTION OF ARTERIOVENOUS (AV) GORE-TEX GRAFT ARM;  Surgeon: Angelia Mould, MD;  Location: [place];  Service: Vascular;  Laterality: Left;  . AV FISTULA PLACEMENT Left 02/09/2019   Procedure: INSERTION OF ARTERIOVENOUS (AV) GORE-TEX GRAFT LEFT THIGH;  Surgeon: Waynetta Sandy, MD;  Location: [place];  Service: Vascular;  Laterality: Left;  . BASCILIC VEIN TRANSPOSITION Left 12/19/2015   Procedure: FIRST STAGE BRACHIAL VEIN TRANSPOSITION;  Surgeon: Conrad [place], MD;  Location: [place];  Service: Vascular;  Laterality: Left;  . BASCILIC VEIN TRANSPOSITION Left 02/08/2016   Procedure: SECOND STAGE BRACHIAL VEIN TRANSPOSITION;  Surgeon: Aaron Edelman  Starlyn Skeans, MD;  Location: [place];  Service: Vascular;  Laterality: Left;  . CARDIAC CATHETERIZATION N/A 07/11/2015   Procedure: Left Heart Cath and Coronary Angiography;  Surgeon: Troy Sine, MD;  Location: [place] CV LAB;  Service: Cardiovascular;  Laterality: N/A;  . Carpel Tunnel Left Dec. 22, 2016  . CHOLECYSTECTOMY    . COLONOSCOPY  2004   Dr. Irving Shows, left sided diverticula and cecal polyp, path unknown  . COLONOSCOPY  10/29/2011   Procedure: COLONOSCOPY;  Surgeon: Daneil Dolin, MD;  [facility];  Service: Endoscopy;  Laterality: N/A;  10:15  . ESOPHAGOGASTRODUODENOSCOPY  11/2002   Dr. Gala Romney, erosive reflux esophagitis, multiple gastric ulcer and antral/bulbar erosions. Serologies positive for H.Pylori and was treated  . ESOPHAGOGASTRODUODENOSCOPY  11/20014   Dr. Gala Romney, small hh only, ulcers healed  . ESOPHAGOGASTRODUODENOSCOPY  09/21/2011   Dr Trevor Iha HH, antral erosions, ?early GAVE  . FISTULOGRAM Left 12/10/2016   Procedure: THROMBECTOMY OF LEFT ARM ARTERIOVENOUS FISTULA;  Surgeon: Waynetta Sandy, MD;  Location: [place];  Service: Vascular;  Laterality: Left;  . INSERTION OF DIALYSIS CATHETER Left 12/10/2016   Procedure: INSERTION OF TUNNELED DIALYSIS CATHETER;   Surgeon: Waynetta Sandy, MD;  Location: [place];  Service: Vascular;  Laterality: Left;  . INSERTION OF DIALYSIS CATHETER Right 06/09/2018   Procedure: INSERTION OF DIALYSIS CATHETER Right subclavian;  Surgeon: Angelia Mould, MD;  Location: [place];  Service: Vascular;  Laterality: Right;  . IR DIALY SHUNT INTRO [place] W/IMG RIGHT Right 01/01/2018  . IR GENERIC HISTORICAL  07/16/2016   IR REMOVAL TUN CV CATH W/O FL 07/16/2016 Saverio Danker, PA-C [facility]-INTERV RAD  . IR PTA ADDL CENTRAL DIALYSIS SEG THRU DIALY CIRCUIT RIGHT Right 10/21/2017  . IR REMOVAL TUN CV CATH W/O FL  05/12/2017  . IR THROMBECTOMY AV FISTULA W/THROMBOLYSIS/PTA INC/SHUNT/IMG RIGHT Right 10/21/2017  . IR THROMBECTOMY AV FISTULA W/THROMBOLYSIS/PTA INC/SHUNT/IMG RIGHT Right 11/27/2017  . IR US GUIDE VASC ACCESS RIGHT  10/21/2017  . IR US GUIDE VASC ACCESS RIGHT  11/27/2017  . IR US GUIDE VASC ACCESS RIGHT  01/01/2018  . LIGATION ARTERIOVENOUS GORTEX GRAFT Right 06/09/2018  . LIGATION ARTERIOVENOUS GORTEX GRAFT Right 06/09/2018   Procedure: LIGATION ARTERIOVENOUS GORTEX GRAFT RIGHT ARM;  Surgeon: Angelia Mould, MD;  Location: [place];  Service: Vascular;  Laterality: Right;  . LIGATION ARTERIOVENOUS GORTEX GRAFT Left 12/31/2018   Procedure: LIGATION OF LEFT UPPER ARM ARTERIVENOUS GORTEX GRAFT, LEFT BRACHIAL ARTERY ENDARTERECTOMY WITH BOVINE PATCH ANGIOPLASTY;  Surgeon: Waynetta Sandy, MD;  Location: [place];  Service: Vascular;  Laterality: Left;  . LIGATION OF ARTERIOVENOUS  FISTULA Left 12/19/2015   Procedure: LIGATION OF RADIOCEPHALIC ARTERIOVENOUS  FISTULA;  Surgeon: Conrad [place], MD;  Location: [place];  Service: Vascular;  Laterality: Left;  Marland Kitchen MASS EXCISION Right 02/18/2017   Procedure: EXCISION OF RIGHT AXILLARY EPIDERMAL INCLUSION CYST;  Surgeon: Conrad [place], MD;  Location: [place];  Service: Vascular;  Laterality: Right;  . PERIPHERAL VASCULAR CATHETERIZATION N/A 12/14/2015   Procedure:  Fistulagram;  Surgeon: Conrad Marietta-Alderwood, MD;  Location: [place] CV LAB;  Service: Cardiovascular;  Laterality: N/A;  . SHOULDER SURGERY Right    fracture  . ULTRASOUND GUIDANCE FOR VASCULAR ACCESS  04/15/2017   Procedure: Ultrasound Guidance For Vascular Access;  Surgeon: Angelia Mould, MD;  Location: [place] CV LAB;  Service: Cardiovascular;;  . UPPER EXTREMITY VENOGRAPHY Bilateral 12/17/2016   Procedure: Bilateral Upper Extremity Venography;  Surgeon: Serafina Mitchell, MD;  Location: [place] CV LAB;  Service: Cardiovascular;  Laterality: Bilateral;  . UPPER EXTREMITY VENOGRAPHY Left 11/03/2018   Procedure: UPPER EXTREMITY VENOGRAPHY;  Surgeon: Serafina Mitchell, MD;  Location: [place] CV LAB;  Service: Cardiovascular;  Laterality: Left;   Social History   Occupational History  . Occupation: retired, Licensed conveyancer    Employer: RETIRED  Tobacco Use  . Smoking status: Former Smoker    Years: 25.00    Quit date: 04/29/2004    Years since quitting: 14.8  . Smokeless tobacco: Former Systems developer    Types: Chew    Quit date: 01/16/1987  . Tobacco comment: quit 2006  Substance and Sexual Activity  . Alcohol use: No  . Drug use: No  . Sexual activity: Not on file

## 2019-02-24 DIAGNOSIS — N2581 Secondary hyperparathyroidism of renal origin: Secondary | ICD-10-CM | POA: Diagnosis not present

## 2019-02-24 DIAGNOSIS — N186 End stage renal disease: Secondary | ICD-10-CM | POA: Diagnosis not present

## 2019-02-24 DIAGNOSIS — Z992 Dependence on renal dialysis: Secondary | ICD-10-CM | POA: Diagnosis not present

## 2019-02-24 DIAGNOSIS — Z23 Encounter for immunization: Secondary | ICD-10-CM | POA: Diagnosis not present

## 2019-02-24 DIAGNOSIS — D509 Iron deficiency anemia, unspecified: Secondary | ICD-10-CM | POA: Diagnosis not present

## 2019-02-26 DIAGNOSIS — N186 End stage renal disease: Secondary | ICD-10-CM | POA: Diagnosis not present

## 2019-02-26 DIAGNOSIS — L97529 Non-pressure chronic ulcer of other part of left foot with unspecified severity: Secondary | ICD-10-CM | POA: Diagnosis not present

## 2019-02-26 DIAGNOSIS — D509 Iron deficiency anemia, unspecified: Secondary | ICD-10-CM | POA: Diagnosis not present

## 2019-02-26 DIAGNOSIS — N2581 Secondary hyperparathyroidism of renal origin: Secondary | ICD-10-CM | POA: Diagnosis not present

## 2019-02-26 DIAGNOSIS — Z23 Encounter for immunization: Secondary | ICD-10-CM | POA: Diagnosis not present

## 2019-02-26 DIAGNOSIS — I739 Peripheral vascular disease, unspecified: Secondary | ICD-10-CM | POA: Diagnosis not present

## 2019-02-26 DIAGNOSIS — Z992 Dependence on renal dialysis: Secondary | ICD-10-CM | POA: Diagnosis not present

## 2019-02-27 DIAGNOSIS — Z992 Dependence on renal dialysis: Secondary | ICD-10-CM | POA: Diagnosis not present

## 2019-02-27 DIAGNOSIS — N186 End stage renal disease: Secondary | ICD-10-CM | POA: Diagnosis not present

## 2019-02-28 DIAGNOSIS — N2581 Secondary hyperparathyroidism of renal origin: Secondary | ICD-10-CM | POA: Diagnosis not present

## 2019-02-28 DIAGNOSIS — N186 End stage renal disease: Secondary | ICD-10-CM | POA: Diagnosis not present

## 2019-02-28 DIAGNOSIS — Z992 Dependence on renal dialysis: Secondary | ICD-10-CM | POA: Diagnosis not present

## 2019-02-28 DIAGNOSIS — D509 Iron deficiency anemia, unspecified: Secondary | ICD-10-CM | POA: Diagnosis not present

## 2019-03-01 DIAGNOSIS — N186 End stage renal disease: Secondary | ICD-10-CM | POA: Diagnosis not present

## 2019-03-01 DIAGNOSIS — N2581 Secondary hyperparathyroidism of renal origin: Secondary | ICD-10-CM | POA: Diagnosis not present

## 2019-03-01 DIAGNOSIS — D509 Iron deficiency anemia, unspecified: Secondary | ICD-10-CM | POA: Diagnosis not present

## 2019-03-01 DIAGNOSIS — Z992 Dependence on renal dialysis: Secondary | ICD-10-CM | POA: Diagnosis not present

## 2019-03-03 DIAGNOSIS — D509 Iron deficiency anemia, unspecified: Secondary | ICD-10-CM | POA: Diagnosis not present

## 2019-03-03 DIAGNOSIS — Z992 Dependence on renal dialysis: Secondary | ICD-10-CM | POA: Diagnosis not present

## 2019-03-03 DIAGNOSIS — N2581 Secondary hyperparathyroidism of renal origin: Secondary | ICD-10-CM | POA: Diagnosis not present

## 2019-03-03 DIAGNOSIS — N186 End stage renal disease: Secondary | ICD-10-CM | POA: Diagnosis not present

## 2019-03-05 ENCOUNTER — Ambulatory Visit: Payer: Medicare Other

## 2019-03-05 DIAGNOSIS — Z992 Dependence on renal dialysis: Secondary | ICD-10-CM | POA: Diagnosis not present

## 2019-03-05 DIAGNOSIS — N186 End stage renal disease: Secondary | ICD-10-CM | POA: Diagnosis not present

## 2019-03-05 DIAGNOSIS — N2581 Secondary hyperparathyroidism of renal origin: Secondary | ICD-10-CM | POA: Diagnosis not present

## 2019-03-05 DIAGNOSIS — D509 Iron deficiency anemia, unspecified: Secondary | ICD-10-CM | POA: Diagnosis not present

## 2019-03-08 DIAGNOSIS — D509 Iron deficiency anemia, unspecified: Secondary | ICD-10-CM | POA: Diagnosis not present

## 2019-03-08 DIAGNOSIS — N2581 Secondary hyperparathyroidism of renal origin: Secondary | ICD-10-CM | POA: Diagnosis not present

## 2019-03-08 DIAGNOSIS — Z992 Dependence on renal dialysis: Secondary | ICD-10-CM | POA: Diagnosis not present

## 2019-03-08 DIAGNOSIS — N186 End stage renal disease: Secondary | ICD-10-CM | POA: Diagnosis not present

## 2019-03-10 DIAGNOSIS — N2581 Secondary hyperparathyroidism of renal origin: Secondary | ICD-10-CM | POA: Diagnosis not present

## 2019-03-10 DIAGNOSIS — N186 End stage renal disease: Secondary | ICD-10-CM | POA: Diagnosis not present

## 2019-03-10 DIAGNOSIS — D509 Iron deficiency anemia, unspecified: Secondary | ICD-10-CM | POA: Diagnosis not present

## 2019-03-10 DIAGNOSIS — Z992 Dependence on renal dialysis: Secondary | ICD-10-CM | POA: Diagnosis not present

## 2019-03-11 ENCOUNTER — Other Ambulatory Visit: Payer: Self-pay

## 2019-03-11 ENCOUNTER — Ambulatory Visit (INDEPENDENT_AMBULATORY_CARE_PROVIDER_SITE_OTHER): Payer: Self-pay | Admitting: Physician Assistant

## 2019-03-11 VITALS — BP 113/70 | HR 74 | Temp 97.7°F | Resp 12 | Ht 65.0 in | Wt 153.0 lb

## 2019-03-11 DIAGNOSIS — N186 End stage renal disease: Secondary | ICD-10-CM

## 2019-03-11 NOTE — Progress Notes (Signed)
    Postoperative Access Visit   History of Present Illness   [name]. is a 78 y.o. year old male who presents for postoperative follow-up for: left thigh arteriovenous graft by Dr. Donzetta Matters (Date: 02/09/19).  The patient's wounds are healed.  The patient denies rest pain or tissue changes L foot.  The patient is able to complete their activities of daily living.  He is currently dialyzing via [place] in [place] on MWF schedule.   Physical Examination   Vitals:   03/11/19 1219  BP: 113/70  Pulse: 74  Resp: 12  Temp: 97.7 F (36.5 C)  TempSrc: Temporal  SpO2: 97%  Weight: 153 lb (69.4 kg)  Height: 5\' 5"  (1.651 m)   Body mass index is 25.46 kg/m.  left leg Incisions are healed, L foot warm with motor and sensation intact; no ulcerations L foot; palpable thrill through L thigh graft    Medical Decision Making   [name]. is a 78 y.o. year old male who presents s/p left thigh arteriovenous graft    Patent L thigh AVG without signs or symptoms of malperfusion LLE  The patient's access will be ready for use Monday 03/15/19  The patient's tunneled dialysis catheter can be removed when Nephrology is comfortable with the performance of the thigh graft  The patient may follow up on a prn basis   Joseph Ligas PA-C [facility] Office: [phone]  Clinic MD: Dr. Oneida Alar

## 2019-03-12 DIAGNOSIS — N186 End stage renal disease: Secondary | ICD-10-CM | POA: Diagnosis not present

## 2019-03-12 DIAGNOSIS — N2581 Secondary hyperparathyroidism of renal origin: Secondary | ICD-10-CM | POA: Diagnosis not present

## 2019-03-12 DIAGNOSIS — Z992 Dependence on renal dialysis: Secondary | ICD-10-CM | POA: Diagnosis not present

## 2019-03-12 DIAGNOSIS — D509 Iron deficiency anemia, unspecified: Secondary | ICD-10-CM | POA: Diagnosis not present

## 2019-03-15 DIAGNOSIS — Z992 Dependence on renal dialysis: Secondary | ICD-10-CM | POA: Diagnosis not present

## 2019-03-15 DIAGNOSIS — N186 End stage renal disease: Secondary | ICD-10-CM | POA: Diagnosis not present

## 2019-03-15 DIAGNOSIS — N2581 Secondary hyperparathyroidism of renal origin: Secondary | ICD-10-CM | POA: Diagnosis not present

## 2019-03-15 DIAGNOSIS — D509 Iron deficiency anemia, unspecified: Secondary | ICD-10-CM | POA: Diagnosis not present

## 2019-03-17 DIAGNOSIS — N2581 Secondary hyperparathyroidism of renal origin: Secondary | ICD-10-CM | POA: Diagnosis not present

## 2019-03-17 DIAGNOSIS — Z992 Dependence on renal dialysis: Secondary | ICD-10-CM | POA: Diagnosis not present

## 2019-03-17 DIAGNOSIS — N186 End stage renal disease: Secondary | ICD-10-CM | POA: Diagnosis not present

## 2019-03-17 DIAGNOSIS — D509 Iron deficiency anemia, unspecified: Secondary | ICD-10-CM | POA: Diagnosis not present

## 2019-03-18 ENCOUNTER — Other Ambulatory Visit: Payer: Self-pay

## 2019-03-18 ENCOUNTER — Encounter: Payer: Self-pay | Admitting: Orthopedic Surgery

## 2019-03-18 ENCOUNTER — Ambulatory Visit (INDEPENDENT_AMBULATORY_CARE_PROVIDER_SITE_OTHER): Payer: Medicare Other | Admitting: Orthopedic Surgery

## 2019-03-18 VITALS — Ht 65.0 in | Wt 153.0 lb

## 2019-03-18 DIAGNOSIS — I779 Disorder of arteries and arterioles, unspecified: Secondary | ICD-10-CM

## 2019-03-18 DIAGNOSIS — Z89431 Acquired absence of right foot: Secondary | ICD-10-CM | POA: Diagnosis not present

## 2019-03-18 NOTE — Progress Notes (Signed)
Office Visit Note   Patient: Joseph LAUTNER Sr.           Date of Birth: 04/09/1941           MRN: [id number] Visit Date: 03/18/2019              Requested by: [name], MD [address] [place],  [place] [address] PCP: [name], MD  Chief Complaint  Patient presents with  . Right Foot - Follow-up    05/09/17 right transmet amputation       HPI: The patient presents today for follow-up of his status post right transmetatarsal amputation he has still not obtained his shoes as was discussed at his last visit but he is going to do this today.  He has no complaints  Assessment & Plan: Visit Diagnoses: No diagnosis found.  Plan: I would like to see him back in about 4 weeks to make sure his shoes are fitting properly.  He is going to go over there today if he has any concerns he will contact us  Follow-Up Instructions: No follow-ups on file.   Ortho Exam  Patient is alert, oriented, no adenopathy, well-dressed, normal affect, normal respiratory effort. Examination of his right foot demonstrates healed surgical incision he has a moderate amount of dry skin and we talked about applying cocoa butter Shea butter to these areas no ulcers no breakdown.  Foot is warm with a palpable pulse  Imaging: No results found. No images are attached to the encounter.  Labs: Lab Results  Component Value Date   REPTSTATUS 06/17/2018 FINAL 06/12/2018   GRAMSTAIN  02/18/2017    RARE WBC PRESENT, PREDOMINANTLY PMN FEW GRAM POSITIVE COCCI    CULT  06/12/2018    NO GROWTH 5 DAYS Performed at [facility], [address], [place], [place] [address]      Lab Results  Component Value Date   ALBUMIN 2.8 (L) 12/02/2018   ALBUMIN 3.0 (L) 12/01/2018   ALBUMIN 1.9 (L) 06/16/2018    Lab Results  Component Value Date   MG 2.0 06/17/2018   MG 1.9 06/14/2018   MG 1.8 06/13/2018   No results found for: VD25OH  No results found for: PREALBUMIN CBC EXTENDED Latest Ref Rng &  Units 02/10/2019 02/09/2019 12/31/2018  WBC 4.0 - 10.5 K/uL 13.9(H) - 7.3  RBC 4.22 - 5.81 MIL/uL 6.00(H) - 6.58(H)  HGB 13.0 - 17.0 g/dL 13.2 16.7 13.8  HCT 39.0 - 52.0 % 40.1 49.0 44.3  PLT 150 - 400 K/uL 249 - 154  NEUTROABS 1.7 - 7.7 K/uL - - -  LYMPHSABS 0.7 - 4.0 K/uL - - -     Body mass index is 25.46 kg/m.  Orders:  No orders of the defined types were placed in this encounter.  No orders of the defined types were placed in this encounter.    Procedures: No procedures performed  Clinical Data: No additional findings.  ROS:  All other systems negative, except as noted in the HPI. Review of Systems  Objective: Vital Signs: Ht 5\' 5"  (1.651 m)   Wt 153 lb (69.4 kg)   BMI 25.46 kg/m   Specialty Comments:  No specialty comments available.  PMFS History: Patient Active Problem List   Diagnosis Date Noted  . Malnutrition of moderate degree 06/18/2018  . Severe sepsis ([place]) 06/12/2018  . Leukocytosis 06/12/2018  . MRSA (methicillin resistant staph aureus) culture positive 06/12/2018  . Essential hypertension 06/12/2018  . ESRD (end  stage renal disease) ([place]) 06/09/2018  . Gangrene of finger ([place]) 06/04/2018  . S/P transmetatarsal amputation of foot, right ([place]) 05/09/2017  . PVD (peripheral vascular disease) ([place]) 04/15/2017  . Nonischemic cardiomyopathy ([place]) 08/15/2015  . Aortic stenosis 08/15/2015  . Moderate aortic stenosis 07/12/2015  . 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 CAD- 40-50% LAD at cath 07/11/15 07/11/2015  . Aspirin intolerance 07/11/2015  . Abnormal stress test   . ESRD on dialysis ([place]) 05/30/2015  . CTS (carpal tunnel syndrome) 02/28/2015  . PVD of LE - Dr Oneida Alar follows 08/20/2012  . Midfoot skin ulcer, right, limited to breakdown of skin ([place]) 01/16/2012  . Encounter for screening colonoscopy 10/08/2011  . Melena 10/08/2011  . Other complications due to renal dialysis device, implant, and graft 09/24/2011   Past  Medical History:  Diagnosis Date  . Anxiety   . Aortic stenosis   . Arthritis    left hand  . CVA (cerebral infarction)   . ESRD (end stage renal disease) on dialysis [facility])    M/W/F at [facility] in [place]  . Essential hypertension    resolved with HD  . Gangrene of right foot ([place])   . Gastric ulcer 2004  . GI bleed    gastric ulcer  . Heart murmur   . History of blood transfusion   . History of cardiomyopathy    LVEF normal as of February 2017  . History of gastric ulcer   . History of stroke    Left side weakness  . Iron deficiency anemia   . Peripheral vascular disease ([place])   . Stroke [facility]) 2000   limp left    Family History  Problem Relation Age of Onset  . Hypertension Mother   . Colon cancer Neg Hx   . Liver disease Neg Hx     Past Surgical History:  Procedure Laterality Date  . ABDOMINAL AORTAGRAM N/A 01/24/2012   Procedure: ABDOMINAL Maxcine Ham;  Surgeon: Elam Dutch, MD;  Location: [facility] CATH LAB;  Service: Cardiovascular;  Laterality: N/A;  . ABDOMINAL AORTOGRAM W/LOWER EXTREMITY N/A 04/15/2017   Procedure: ABDOMINAL AORTOGRAM W/LOWER EXTREMITY;  Surgeon: Angelia Mould, MD;  Location: [place] CV LAB;  Service: Cardiovascular;  Laterality: N/A;  . AMPUTATION Right 05/09/2017   Procedure: RIGHT TRANSMETATARSAL AMPUTATION;  Surgeon: [name], MD;  Location: [place];  Service: Orthopedics;  Laterality: Right;  . AMPUTATION Right 06/13/2018   Procedure: AMPUTATION RIGHT LONG FINGER;  Surgeon: Dayna Barker, MD;  Location: [place];  Service: Plastics;  Laterality: Right;  . ARTERIOVENOUS GRAFT PLACEMENT Right right arm  . AV FISTULA PLACEMENT Left 08/31/2015   Procedure: ARTERIOVENOUS (AV) FISTULA CREATION- LEFT RADIOCEPHALIC;  Surgeon: Mal Misty, MD;  Location: [place];  Service: Vascular;  Laterality: Left;  . AV FISTULA PLACEMENT Right 02/18/2017   Procedure: INSERTION OF ARTERIOVENOUS (AV) GORE-TEX GRAFT  RIGHT UPPER ARM;  Surgeon: Conrad , MD;  Location: [place];  Service: Vascular;  Laterality: Right;  . AV FISTULA PLACEMENT Left 12/01/2018   Procedure: INSERTION OF ARTERIOVENOUS (AV) GORE-TEX GRAFT ARM;  Surgeon: Angelia Mould, MD;  Location: [place];  Service: Vascular;  Laterality: Left;  . AV FISTULA PLACEMENT Left 02/09/2019   Procedure: INSERTION OF ARTERIOVENOUS (AV) GORE-TEX GRAFT LEFT THIGH;  Surgeon: Waynetta Sandy, MD;  Location: [place];  Service: Vascular;  Laterality: Left;  . BASCILIC VEIN TRANSPOSITION Left 12/19/2015   Procedure: FIRST STAGE BRACHIAL VEIN TRANSPOSITION;  Surgeon:  Conrad [place], MD;  Location: [place];  Service: Vascular;  Laterality: Left;  . BASCILIC VEIN TRANSPOSITION Left 02/08/2016   Procedure: SECOND STAGE BRACHIAL VEIN TRANSPOSITION;  Surgeon: Conrad [place], MD;  Location: [place];  Service: Vascular;  Laterality: Left;  . CARDIAC CATHETERIZATION N/A 07/11/2015   Procedure: Left Heart Cath and Coronary Angiography;  Surgeon: Troy Sine, MD;  Location: [place] CV LAB;  Service: Cardiovascular;  Laterality: N/A;  . Carpel Tunnel Left Dec. 22, 2016  . CHOLECYSTECTOMY    . COLONOSCOPY  2004   Dr. Irving Shows, left sided diverticula and cecal polyp, path unknown  . COLONOSCOPY  10/29/2011   Procedure: COLONOSCOPY;  Surgeon: Daneil Dolin, MD;  [facility];  Service: Endoscopy;  Laterality: N/A;  10:15  . ESOPHAGOGASTRODUODENOSCOPY  11/2002   Dr. Gala Romney, erosive reflux esophagitis, multiple gastric ulcer and antral/bulbar erosions. Serologies positive for H.Pylori and was treated  . ESOPHAGOGASTRODUODENOSCOPY  11/20014   Dr. Gala Romney, small hh only, ulcers healed  . ESOPHAGOGASTRODUODENOSCOPY  09/21/2011   Dr Trevor Iha HH, antral erosions, ?early GAVE  . FISTULOGRAM Left 12/10/2016   Procedure: THROMBECTOMY OF LEFT ARM ARTERIOVENOUS FISTULA;  Surgeon: Waynetta Sandy, MD;  Location: [place];  Service: Vascular;  Laterality: Left;  . INSERTION OF DIALYSIS CATHETER  Left 12/10/2016   Procedure: INSERTION OF TUNNELED DIALYSIS CATHETER;  Surgeon: Waynetta Sandy, MD;  Location: [place];  Service: Vascular;  Laterality: Left;  . INSERTION OF DIALYSIS CATHETER Right 06/09/2018   Procedure: INSERTION OF DIALYSIS CATHETER Right subclavian;  Surgeon: Angelia Mould, MD;  Location: [place];  Service: Vascular;  Laterality: Right;  . IR DIALY SHUNT INTRO [place] W/IMG RIGHT Right 01/01/2018  . IR GENERIC HISTORICAL  07/16/2016   IR REMOVAL TUN CV CATH W/O FL 07/16/2016 Saverio Danker, PA-C [facility]INTER[facility]  . IR PTA ADDL CENTRAL DIALYSIS SEG THRU DIALY CIRCUIT RIGHT Right 10/21/2017  . IR REMOVAL TUN CV CATH W/O FL  05/12/2017  . IR THROMBECTOMY AV FISTULA W/THROMBOLYSIS/PTA INC/SHUNT/IMG RIGHT Right 10/21/2017  . IR THROMBECTOMY AV FISTULA W/THROMBOLYSIS/PTA INC/SHUNT/IMG RIGHT Right 11/27/2017  . IR US GUIDE VASC ACCESS RIGHT  10/21/2017  . IR US GUIDE VASC ACCESS RIGHT  11/27/2017  . IR US GUIDE VASC ACCESS RIGHT  01/01/2018  . LIGATION ARTERIOVENOUS GORTEX GRAFT Right 06/09/2018  . LIGATION ARTERIOVENOUS GORTEX GRAFT Right 06/09/2018   Procedure: LIGATION ARTERIOVENOUS GORTEX GRAFT RIGHT ARM;  Surgeon: Angelia Mould, MD;  Location: [place];  Service: Vascular;  Laterality: Right;  . LIGATION ARTERIOVENOUS GORTEX GRAFT Left 12/31/2018   Procedure: LIGATION OF LEFT UPPER ARM ARTERIVENOUS GORTEX GRAFT, LEFT BRACHIAL ARTERY ENDARTERECTOMY WITH BOVINE PATCH ANGIOPLASTY;  Surgeon: Waynetta Sandy, MD;  Location: [place];  Service: Vascular;  Laterality: Left;  . LIGATION OF ARTERIOVENOUS  FISTULA Left 12/19/2015   Procedure: LIGATION OF RADIOCEPHALIC ARTERIOVENOUS  FISTULA;  Surgeon: Conrad [place], MD;  Location: [place];  Service: Vascular;  Laterality: Left;  Marland Kitchen MASS EXCISION Right 02/18/2017   Procedure: EXCISION OF RIGHT AXILLARY EPIDERMAL INCLUSION CYST;  Surgeon: Conrad [place], MD;  Location: [place];  Service: Vascular;  Laterality: Right;  .  PERIPHERAL VASCULAR CATHETERIZATION N/A 12/14/2015   Procedure: Fistulagram;  Surgeon: Conrad [place], MD;  Location: [place] CV LAB;  Service: Cardiovascular;  Laterality: N/A;  . SHOULDER SURGERY Right    fracture  . ULTRASOUND GUIDANCE FOR VASCULAR ACCESS  04/15/2017   Procedure: Ultrasound Guidance  For Vascular Access;  Surgeon: Angelia Mould, MD;  Location: [place] CV LAB;  Service: Cardiovascular;;  . UPPER EXTREMITY VENOGRAPHY Bilateral 12/17/2016   Procedure: Bilateral Upper Extremity Venography;  Surgeon: Serafina Mitchell, MD;  Location: [place] CV LAB;  Service: Cardiovascular;  Laterality: Bilateral;  . UPPER EXTREMITY VENOGRAPHY Left 11/03/2018   Procedure: UPPER EXTREMITY VENOGRAPHY;  Surgeon: Serafina Mitchell, MD;  Location: [place] CV LAB;  Service: Cardiovascular;  Laterality: Left;   Social History   Occupational History  . Occupation: retired, Licensed conveyancer    Employer: RETIRED  Tobacco Use  . Smoking status: Former Smoker    Years: 25.00    Quit date: 04/29/2004    Years since quitting: 14.8  . Smokeless tobacco: Former Systems developer    Types: Chew    Quit date: 01/16/1987  . Tobacco comment: quit 2006  Substance and Sexual Activity  . Alcohol use: No  . Drug use: No  . Sexual activity: Not on file

## 2019-03-19 DIAGNOSIS — D509 Iron deficiency anemia, unspecified: Secondary | ICD-10-CM | POA: Diagnosis not present

## 2019-03-19 DIAGNOSIS — N186 End stage renal disease: Secondary | ICD-10-CM | POA: Diagnosis not present

## 2019-03-19 DIAGNOSIS — Z992 Dependence on renal dialysis: Secondary | ICD-10-CM | POA: Diagnosis not present

## 2019-03-19 DIAGNOSIS — N2581 Secondary hyperparathyroidism of renal origin: Secondary | ICD-10-CM | POA: Diagnosis not present

## 2019-03-22 DIAGNOSIS — N2581 Secondary hyperparathyroidism of renal origin: Secondary | ICD-10-CM | POA: Diagnosis not present

## 2019-03-22 DIAGNOSIS — Z992 Dependence on renal dialysis: Secondary | ICD-10-CM | POA: Diagnosis not present

## 2019-03-22 DIAGNOSIS — N186 End stage renal disease: Secondary | ICD-10-CM | POA: Diagnosis not present

## 2019-03-22 DIAGNOSIS — D509 Iron deficiency anemia, unspecified: Secondary | ICD-10-CM | POA: Diagnosis not present

## 2019-03-23 DIAGNOSIS — Z452 Encounter for adjustment and management of vascular access device: Secondary | ICD-10-CM | POA: Diagnosis not present

## 2019-03-24 DIAGNOSIS — D509 Iron deficiency anemia, unspecified: Secondary | ICD-10-CM | POA: Diagnosis not present

## 2019-03-24 DIAGNOSIS — N186 End stage renal disease: Secondary | ICD-10-CM | POA: Diagnosis not present

## 2019-03-24 DIAGNOSIS — Z992 Dependence on renal dialysis: Secondary | ICD-10-CM | POA: Diagnosis not present

## 2019-03-24 DIAGNOSIS — N2581 Secondary hyperparathyroidism of renal origin: Secondary | ICD-10-CM | POA: Diagnosis not present

## 2019-03-25 DIAGNOSIS — N2581 Secondary hyperparathyroidism of renal origin: Secondary | ICD-10-CM | POA: Diagnosis not present

## 2019-03-25 DIAGNOSIS — Z992 Dependence on renal dialysis: Secondary | ICD-10-CM | POA: Diagnosis not present

## 2019-03-25 DIAGNOSIS — D509 Iron deficiency anemia, unspecified: Secondary | ICD-10-CM | POA: Diagnosis not present

## 2019-03-25 DIAGNOSIS — N186 End stage renal disease: Secondary | ICD-10-CM | POA: Diagnosis not present

## 2019-03-27 DIAGNOSIS — Z992 Dependence on renal dialysis: Secondary | ICD-10-CM | POA: Diagnosis not present

## 2019-03-27 DIAGNOSIS — N186 End stage renal disease: Secondary | ICD-10-CM | POA: Diagnosis not present

## 2019-03-27 DIAGNOSIS — N2581 Secondary hyperparathyroidism of renal origin: Secondary | ICD-10-CM | POA: Diagnosis not present

## 2019-03-27 DIAGNOSIS — D509 Iron deficiency anemia, unspecified: Secondary | ICD-10-CM | POA: Diagnosis not present

## 2019-03-29 DIAGNOSIS — N2581 Secondary hyperparathyroidism of renal origin: Secondary | ICD-10-CM | POA: Diagnosis not present

## 2019-03-29 DIAGNOSIS — N186 End stage renal disease: Secondary | ICD-10-CM | POA: Diagnosis not present

## 2019-03-29 DIAGNOSIS — D509 Iron deficiency anemia, unspecified: Secondary | ICD-10-CM | POA: Diagnosis not present

## 2019-03-29 DIAGNOSIS — Z992 Dependence on renal dialysis: Secondary | ICD-10-CM | POA: Diagnosis not present

## 2019-03-30 DIAGNOSIS — N2581 Secondary hyperparathyroidism of renal origin: Secondary | ICD-10-CM | POA: Diagnosis not present

## 2019-03-30 DIAGNOSIS — D509 Iron deficiency anemia, unspecified: Secondary | ICD-10-CM | POA: Diagnosis not present

## 2019-03-30 DIAGNOSIS — N186 End stage renal disease: Secondary | ICD-10-CM | POA: Diagnosis not present

## 2019-03-30 DIAGNOSIS — L97529 Non-pressure chronic ulcer of other part of left foot with unspecified severity: Secondary | ICD-10-CM | POA: Diagnosis not present

## 2019-03-30 DIAGNOSIS — Z992 Dependence on renal dialysis: Secondary | ICD-10-CM | POA: Diagnosis not present

## 2019-03-30 DIAGNOSIS — I739 Peripheral vascular disease, unspecified: Secondary | ICD-10-CM | POA: Diagnosis not present

## 2019-03-31 DIAGNOSIS — N2581 Secondary hyperparathyroidism of renal origin: Secondary | ICD-10-CM | POA: Diagnosis not present

## 2019-03-31 DIAGNOSIS — N186 End stage renal disease: Secondary | ICD-10-CM | POA: Diagnosis not present

## 2019-03-31 DIAGNOSIS — Z992 Dependence on renal dialysis: Secondary | ICD-10-CM | POA: Diagnosis not present

## 2019-03-31 DIAGNOSIS — D509 Iron deficiency anemia, unspecified: Secondary | ICD-10-CM | POA: Diagnosis not present

## 2019-04-02 DIAGNOSIS — N2581 Secondary hyperparathyroidism of renal origin: Secondary | ICD-10-CM | POA: Diagnosis not present

## 2019-04-02 DIAGNOSIS — D509 Iron deficiency anemia, unspecified: Secondary | ICD-10-CM | POA: Diagnosis not present

## 2019-04-02 DIAGNOSIS — N186 End stage renal disease: Secondary | ICD-10-CM | POA: Diagnosis not present

## 2019-04-02 DIAGNOSIS — Z992 Dependence on renal dialysis: Secondary | ICD-10-CM | POA: Diagnosis not present

## 2019-04-05 DIAGNOSIS — N186 End stage renal disease: Secondary | ICD-10-CM | POA: Diagnosis not present

## 2019-04-05 DIAGNOSIS — Z992 Dependence on renal dialysis: Secondary | ICD-10-CM | POA: Diagnosis not present

## 2019-04-05 DIAGNOSIS — D509 Iron deficiency anemia, unspecified: Secondary | ICD-10-CM | POA: Diagnosis not present

## 2019-04-05 DIAGNOSIS — N2581 Secondary hyperparathyroidism of renal origin: Secondary | ICD-10-CM | POA: Diagnosis not present

## 2019-04-07 DIAGNOSIS — D509 Iron deficiency anemia, unspecified: Secondary | ICD-10-CM | POA: Diagnosis not present

## 2019-04-07 DIAGNOSIS — N2581 Secondary hyperparathyroidism of renal origin: Secondary | ICD-10-CM | POA: Diagnosis not present

## 2019-04-07 DIAGNOSIS — N186 End stage renal disease: Secondary | ICD-10-CM | POA: Diagnosis not present

## 2019-04-07 DIAGNOSIS — Z992 Dependence on renal dialysis: Secondary | ICD-10-CM | POA: Diagnosis not present

## 2019-04-09 DIAGNOSIS — D509 Iron deficiency anemia, unspecified: Secondary | ICD-10-CM | POA: Diagnosis not present

## 2019-04-09 DIAGNOSIS — N2581 Secondary hyperparathyroidism of renal origin: Secondary | ICD-10-CM | POA: Diagnosis not present

## 2019-04-09 DIAGNOSIS — N186 End stage renal disease: Secondary | ICD-10-CM | POA: Diagnosis not present

## 2019-04-09 DIAGNOSIS — Z992 Dependence on renal dialysis: Secondary | ICD-10-CM | POA: Diagnosis not present

## 2019-04-12 DIAGNOSIS — D509 Iron deficiency anemia, unspecified: Secondary | ICD-10-CM | POA: Diagnosis not present

## 2019-04-12 DIAGNOSIS — N186 End stage renal disease: Secondary | ICD-10-CM | POA: Diagnosis not present

## 2019-04-12 DIAGNOSIS — Z992 Dependence on renal dialysis: Secondary | ICD-10-CM | POA: Diagnosis not present

## 2019-04-12 DIAGNOSIS — N2581 Secondary hyperparathyroidism of renal origin: Secondary | ICD-10-CM | POA: Diagnosis not present

## 2019-04-14 DIAGNOSIS — N2581 Secondary hyperparathyroidism of renal origin: Secondary | ICD-10-CM | POA: Diagnosis not present

## 2019-04-14 DIAGNOSIS — Z992 Dependence on renal dialysis: Secondary | ICD-10-CM | POA: Diagnosis not present

## 2019-04-14 DIAGNOSIS — D509 Iron deficiency anemia, unspecified: Secondary | ICD-10-CM | POA: Diagnosis not present

## 2019-04-14 DIAGNOSIS — N186 End stage renal disease: Secondary | ICD-10-CM | POA: Diagnosis not present

## 2019-04-15 ENCOUNTER — Encounter: Payer: Self-pay | Admitting: Orthopedic Surgery

## 2019-04-15 ENCOUNTER — Other Ambulatory Visit: Payer: Self-pay

## 2019-04-15 ENCOUNTER — Ambulatory Visit (INDEPENDENT_AMBULATORY_CARE_PROVIDER_SITE_OTHER): Payer: Medicare Other | Admitting: Orthopedic Surgery

## 2019-04-15 VITALS — Ht 65.0 in | Wt 153.0 lb

## 2019-04-15 DIAGNOSIS — Z89431 Acquired absence of right foot: Secondary | ICD-10-CM

## 2019-04-15 DIAGNOSIS — I779 Disorder of arteries and arterioles, unspecified: Secondary | ICD-10-CM | POA: Diagnosis not present

## 2019-04-15 NOTE — Progress Notes (Signed)
Office Visit Note   Patient: Joseph TABAR Sr.           Date of Birth: [birth year]           MRN: [id number] Visit Date: 04/15/2019              Requested by: [name], MD [address] [place],  [place] [address] PCP: [name], MD  Chief Complaint  Patient presents with  . Right Foot - Follow-up      HPI: This is a pleasant gentleman who is here in follow-up he is almost 1 year status post transmetatarsal amputation on the right he comes in to have some of the callusing trimmed down.  At his last visit we discussed shoes and he has ordered these and is going to stop fight and see if they are in today  Assessment & Plan: Visit Diagnoses: No diagnosis found.  Plan: He will follow up in 1 month we discussed that a compression socks such as a VIVE may be helpful in controlling some of his dry skin issues he is going to think about this.  Follow-Up Instructions: No follow-ups on file.   Ortho Exam  Patient is alert, oriented, no adenopathy, well-dressed, normal affect, normal respiratory effort. Right lower extremity amputation stump is healed but does have quite a bit of callusing no cellulitis minimal swelling.  After obtaining verbal consent I did debride the calluses at the end of the amputation stump and beneath his foot to soft healthy tissue  Imaging: No results found. No images are attached to the encounter.  Labs: Lab Results  Component Value Date   REPTSTATUS 06/17/2018 FINAL 06/12/2018   GRAMSTAIN  02/18/2017    RARE WBC PRESENT, PREDOMINANTLY PMN FEW GRAM POSITIVE COCCI    CULT  06/12/2018    NO GROWTH 5 DAYS Performed at [facility], [address] [place], [place] [address]      Lab Results  Component Value Date   ALBUMIN 2.8 (L) 12/02/2018   ALBUMIN 3.0 (L) 12/01/2018   ALBUMIN 1.9 (L) 06/16/2018    Lab Results  Component Value Date   MG 2.0 06/17/2018   MG 1.9 06/14/2018   MG 1.8 06/13/2018   No results found for:  VD25OH  No results found for: PREALBUMIN CBC EXTENDED Latest Ref Rng & Units 02/10/2019 02/09/2019 12/31/2018  WBC 4.0 - 10.5 K/uL 13.9(H) - 7.3  RBC 4.22 - 5.81 MIL/uL 6.00(H) - 6.58(H)  HGB 13.0 - 17.0 g/dL 13.2 16.7 13.8  HCT 39.0 - 52.0 % 40.1 49.0 44.3  PLT 150 - 400 K/uL 249 - 154  NEUTROABS 1.7 - 7.7 K/uL - - -  LYMPHSABS 0.7 - 4.0 K/uL - - -     Body mass index is 25.46 kg/m.  Orders:  No orders of the defined types were placed in this encounter.  No orders of the defined types were placed in this encounter.    Procedures: No procedures performed  Clinical Data: No additional findings.  ROS:  All other systems negative, except as noted in the HPI. Review of Systems  Objective: Vital Signs: Ht 5\' 5"  (1.651 m)   Wt 153 lb (69.4 kg)   BMI 25.46 kg/m   Specialty Comments:  No specialty comments available.  PMFS History: Patient Active Problem List   Diagnosis Date Noted  . Malnutrition of moderate degree 06/18/2018  . Severe sepsis ([place]) 06/12/2018  . Leukocytosis 06/12/2018  . MRSA (methicillin resistant staph aureus)  culture positive 06/12/2018  . Essential hypertension 06/12/2018  . ESRD (end stage renal disease) ([place]) 06/09/2018  . Gangrene of finger ([place]) 06/04/2018  . S/P transmetatarsal amputation of foot, right ([place]) 05/09/2017  . PVD (peripheral vascular disease) ([place]) 04/15/2017  . Nonischemic cardiomyopathy ([place]) 08/15/2015  . Aortic stenosis 08/15/2015  . Moderate aortic stenosis 07/12/2015  . 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 CAD- 40-50% LAD at cath 07/11/15 07/11/2015  . Aspirin intolerance 07/11/2015  . Abnormal stress test   . ESRD on dialysis ([place]) 05/30/2015  . CTS (carpal tunnel syndrome) 02/28/2015  . PVD of LE - Dr Oneida Alar follows 08/20/2012  . Midfoot skin ulcer, right, limited to breakdown of skin ([place]) 01/16/2012  . Encounter for screening colonoscopy 10/08/2011  . Melena 10/08/2011  . Other complications  due to renal dialysis device, implant, and graft 09/24/2011   Past Medical History:  Diagnosis Date  . Anxiety   . Aortic stenosis   . Arthritis    left hand  . CVA (cerebral infarction)   . ESRD (end stage renal disease) on dialysis [facility])    M/W/F at [facility] in [place]  . Essential hypertension    resolved with HD  . Gangrene of right foot ([place])   . Gastric ulcer 2004  . GI bleed    gastric ulcer  . Heart murmur   . History of blood transfusion   . History of cardiomyopathy    LVEF normal as of February 2017  . History of gastric ulcer   . History of stroke    Left side weakness  . Iron deficiency anemia   . Peripheral vascular disease ([place])   . Stroke [facility]) 2000   limp left    Family History  Problem Relation Age of Onset  . Hypertension Mother   . Colon cancer Neg Hx   . Liver disease Neg Hx     Past Surgical History:  Procedure Laterality Date  . ABDOMINAL AORTAGRAM N/A 01/24/2012   Procedure: ABDOMINAL Maxcine Ham;  Surgeon: Elam Dutch, MD;  Location: [facility] CATH LAB;  Service: Cardiovascular;  Laterality: N/A;  . ABDOMINAL AORTOGRAM W/LOWER EXTREMITY N/A 04/15/2017   Procedure: ABDOMINAL AORTOGRAM W/LOWER EXTREMITY;  Surgeon: Angelia Mould, MD;  Location: [place] CV LAB;  Service: Cardiovascular;  Laterality: N/A;  . AMPUTATION Right 05/09/2017   Procedure: RIGHT TRANSMETATARSAL AMPUTATION;  Surgeon: [name], MD;  Location: [place];  Service: Orthopedics;  Laterality: Right;  . AMPUTATION Right 06/13/2018   Procedure: AMPUTATION RIGHT LONG FINGER;  Surgeon: Dayna Barker, MD;  Location: [place];  Service: Plastics;  Laterality: Right;  . ARTERIOVENOUS GRAFT PLACEMENT Right right arm  . AV FISTULA PLACEMENT Left 08/31/2015   Procedure: ARTERIOVENOUS (AV) FISTULA CREATION- LEFT RADIOCEPHALIC;  Surgeon: Mal Misty, MD;  Location: [place];  Service: Vascular;  Laterality: Left;  . AV FISTULA PLACEMENT Right 02/18/2017   Procedure: INSERTION OF  ARTERIOVENOUS (AV) GORE-TEX GRAFT  RIGHT UPPER ARM;  Surgeon: Conrad Peck, MD;  Location: [place];  Service: Vascular;  Laterality: Right;  . AV FISTULA PLACEMENT Left 12/01/2018   Procedure: INSERTION OF ARTERIOVENOUS (AV) GORE-TEX GRAFT ARM;  Surgeon: Angelia Mould, MD;  Location: [place];  Service: Vascular;  Laterality: Left;  . AV FISTULA PLACEMENT Left 02/09/2019   Procedure: INSERTION OF ARTERIOVENOUS (AV) GORE-TEX GRAFT LEFT THIGH;  Surgeon: Waynetta Sandy, MD;  Location: [place];  Service: Vascular;  Laterality: Left;  . BASCILIC VEIN TRANSPOSITION  Left 12/19/2015   Procedure: FIRST STAGE BRACHIAL VEIN TRANSPOSITION;  Surgeon: Conrad Jim Wells, MD;  Location: [place];  Service: Vascular;  Laterality: Left;  . BASCILIC VEIN TRANSPOSITION Left 02/08/2016   Procedure: SECOND STAGE BRACHIAL VEIN TRANSPOSITION;  Surgeon: Conrad Garysburg, MD;  Location: [place];  Service: Vascular;  Laterality: Left;  . CARDIAC CATHETERIZATION N/A 07/11/2015   Procedure: Left Heart Cath and Coronary Angiography;  Surgeon: Troy Sine, MD;  Location: [place] CV LAB;  Service: Cardiovascular;  Laterality: N/A;  . Carpel Tunnel Left Dec. 22, 2016  . CHOLECYSTECTOMY    . COLONOSCOPY  2004   Dr. Irving Shows, left sided diverticula and cecal polyp, path unknown  . COLONOSCOPY  10/29/2011   Procedure: COLONOSCOPY;  Surgeon: Daneil Dolin, MD;  [facility];  Service: Endoscopy;  Laterality: N/A;  10:15  . ESOPHAGOGASTRODUODENOSCOPY  11/2002   Dr. Gala Romney, erosive reflux esophagitis, multiple gastric ulcer and antral/bulbar erosions. Serologies positive for H.Pylori and was treated  . ESOPHAGOGASTRODUODENOSCOPY  11/20014   Dr. Gala Romney, small hh only, ulcers healed  . ESOPHAGOGASTRODUODENOSCOPY  09/21/2011   Dr Trevor Iha [facility], antral erosions, ?early GAVE  . FISTULOGRAM Left 12/10/2016   Procedure: THROMBECTOMY OF LEFT ARM ARTERIOVENOUS FISTULA;  Surgeon: Waynetta Sandy, MD;  Location: [place];   Service: Vascular;  Laterality: Left;  . INSERTION OF DIALYSIS CATHETER Left 12/10/2016   Procedure: INSERTION OF TUNNELED DIALYSIS CATHETER;  Surgeon: Waynetta Sandy, MD;  Location: [place];  Service: Vascular;  Laterality: Left;  . INSERTION OF DIALYSIS CATHETER Right 06/09/2018   Procedure: INSERTION OF DIALYSIS CATHETER Right subclavian;  Surgeon: Angelia Mould, MD;  Location: [place];  Service: Vascular;  Laterality: Right;  . IR DIALY SHUNT INTRO [place] W/IMG RIGHT Right 01/01/2018  . IR GENERIC HISTORICAL  07/16/2016   IR REMOVAL TUN CV CATH W/O FL 07/16/2016 Saverio Danker, PA-C [facility]-INTER[facility]  . IR PTA ADDL CENTRAL DIALYSIS SEG THRU DIALY CIRCUIT RIGHT Right 10/21/2017  . IR REMOVAL TUN CV CATH W/O FL  05/12/2017  . IR THROMBECTOMY AV FISTULA W/THROMBOLYSIS/PTA INC/SHUNT/IMG RIGHT Right 10/21/2017  . IR THROMBECTOMY AV FISTULA W/THROMBOLYSIS/PTA INC/SHUNT/IMG RIGHT Right 11/27/2017  . IR US GUIDE VASC ACCESS RIGHT  10/21/2017  . IR US GUIDE VASC ACCESS RIGHT  11/27/2017  . IR US GUIDE VASC ACCESS RIGHT  01/01/2018  . LIGATION ARTERIOVENOUS GORTEX GRAFT Right 06/09/2018  . LIGATION ARTERIOVENOUS GORTEX GRAFT Right 06/09/2018   Procedure: LIGATION ARTERIOVENOUS GORTEX GRAFT RIGHT ARM;  Surgeon: Angelia Mould, MD;  Location: [place];  Service: Vascular;  Laterality: Right;  . LIGATION ARTERIOVENOUS GORTEX GRAFT Left 12/31/2018   Procedure: LIGATION OF LEFT UPPER ARM ARTERIVENOUS GORTEX GRAFT, LEFT BRACHIAL ARTERY ENDARTERECTOMY WITH BOVINE PATCH ANGIOPLASTY;  Surgeon: Waynetta Sandy, MD;  Location: [place];  Service: Vascular;  Laterality: Left;  . LIGATION OF ARTERIOVENOUS  FISTULA Left 12/19/2015   Procedure: LIGATION OF RADIOCEPHALIC ARTERIOVENOUS  FISTULA;  Surgeon: Conrad Bassett, MD;  Location: [place];  Service: Vascular;  Laterality: Left;  Marland Kitchen MASS EXCISION Right 02/18/2017   Procedure: EXCISION OF RIGHT AXILLARY EPIDERMAL INCLUSION CYST;  Surgeon: Conrad [place]ville, MD;  Location: [facility];  Service: Vascular;  Laterality: Right;  . PERIPHERAL VASCULAR CATHETERIZATION N/A 12/14/2015   Procedure: Fistulagram;  Surgeon: Conrad Gray, MD;  Location: [place] CV LAB;  Service: Cardiovascular;  Laterality: N/A;  . SHOULDER SURGERY Right    fracture  .  ULTRASOUND GUIDANCE FOR VASCULAR ACCESS  04/15/2017   Procedure: Ultrasound Guidance For Vascular Access;  Surgeon: Angelia Mould, MD;  Location: [place] CV LAB;  Service: Cardiovascular;;  . UPPER EXTREMITY VENOGRAPHY Bilateral 12/17/2016   Procedure: Bilateral Upper Extremity Venography;  Surgeon: Serafina Mitchell, MD;  Location: [place] CV LAB;  Service: Cardiovascular;  Laterality: Bilateral;  . UPPER EXTREMITY VENOGRAPHY Left 11/03/2018   Procedure: UPPER EXTREMITY VENOGRAPHY;  Surgeon: Serafina Mitchell, MD;  Location: [place] CV LAB;  Service: Cardiovascular;  Laterality: Left;   Social History   Occupational History  . Occupation: retired, Licensed conveyancer    Employer: RETIRED  Tobacco Use  . Smoking status: Former Smoker    Years: 25.00    Quit date: 04/29/2004    Years since quitting: 14.9  . Smokeless tobacco: Former Systems developer    Types: Chew    Quit date: 01/16/1987  . Tobacco comment: quit 2006  Substance and Sexual Activity  . Alcohol use: No  . Drug use: No  . Sexual activity: Not on file

## 2019-04-16 DIAGNOSIS — Z992 Dependence on renal dialysis: Secondary | ICD-10-CM | POA: Diagnosis not present

## 2019-04-16 DIAGNOSIS — N186 End stage renal disease: Secondary | ICD-10-CM | POA: Diagnosis not present

## 2019-04-16 DIAGNOSIS — N2581 Secondary hyperparathyroidism of renal origin: Secondary | ICD-10-CM | POA: Diagnosis not present

## 2019-04-16 DIAGNOSIS — D509 Iron deficiency anemia, unspecified: Secondary | ICD-10-CM | POA: Diagnosis not present

## 2019-04-19 DIAGNOSIS — Z992 Dependence on renal dialysis: Secondary | ICD-10-CM | POA: Diagnosis not present

## 2019-04-19 DIAGNOSIS — N2581 Secondary hyperparathyroidism of renal origin: Secondary | ICD-10-CM | POA: Diagnosis not present

## 2019-04-19 DIAGNOSIS — D509 Iron deficiency anemia, unspecified: Secondary | ICD-10-CM | POA: Diagnosis not present

## 2019-04-19 DIAGNOSIS — N186 End stage renal disease: Secondary | ICD-10-CM | POA: Diagnosis not present

## 2019-04-21 DIAGNOSIS — D509 Iron deficiency anemia, unspecified: Secondary | ICD-10-CM | POA: Diagnosis not present

## 2019-04-21 DIAGNOSIS — Z992 Dependence on renal dialysis: Secondary | ICD-10-CM | POA: Diagnosis not present

## 2019-04-21 DIAGNOSIS — N2581 Secondary hyperparathyroidism of renal origin: Secondary | ICD-10-CM | POA: Diagnosis not present

## 2019-04-21 DIAGNOSIS — N186 End stage renal disease: Secondary | ICD-10-CM | POA: Diagnosis not present

## 2019-04-24 DIAGNOSIS — N2581 Secondary hyperparathyroidism of renal origin: Secondary | ICD-10-CM | POA: Diagnosis not present

## 2019-04-24 DIAGNOSIS — D509 Iron deficiency anemia, unspecified: Secondary | ICD-10-CM | POA: Diagnosis not present

## 2019-04-24 DIAGNOSIS — Z992 Dependence on renal dialysis: Secondary | ICD-10-CM | POA: Diagnosis not present

## 2019-04-24 DIAGNOSIS — N186 End stage renal disease: Secondary | ICD-10-CM | POA: Diagnosis not present

## 2019-04-26 DIAGNOSIS — N2581 Secondary hyperparathyroidism of renal origin: Secondary | ICD-10-CM | POA: Diagnosis not present

## 2019-04-26 DIAGNOSIS — N186 End stage renal disease: Secondary | ICD-10-CM | POA: Diagnosis not present

## 2019-04-26 DIAGNOSIS — D509 Iron deficiency anemia, unspecified: Secondary | ICD-10-CM | POA: Diagnosis not present

## 2019-04-26 DIAGNOSIS — Z992 Dependence on renal dialysis: Secondary | ICD-10-CM | POA: Diagnosis not present

## 2019-04-28 DIAGNOSIS — D509 Iron deficiency anemia, unspecified: Secondary | ICD-10-CM | POA: Diagnosis not present

## 2019-04-28 DIAGNOSIS — Z992 Dependence on renal dialysis: Secondary | ICD-10-CM | POA: Diagnosis not present

## 2019-04-28 DIAGNOSIS — N2581 Secondary hyperparathyroidism of renal origin: Secondary | ICD-10-CM | POA: Diagnosis not present

## 2019-04-28 DIAGNOSIS — N186 End stage renal disease: Secondary | ICD-10-CM | POA: Diagnosis not present

## 2019-04-30 DIAGNOSIS — D509 Iron deficiency anemia, unspecified: Secondary | ICD-10-CM | POA: Diagnosis not present

## 2019-04-30 DIAGNOSIS — N2581 Secondary hyperparathyroidism of renal origin: Secondary | ICD-10-CM | POA: Diagnosis not present

## 2019-04-30 DIAGNOSIS — N186 End stage renal disease: Secondary | ICD-10-CM | POA: Diagnosis not present

## 2019-04-30 DIAGNOSIS — Z992 Dependence on renal dialysis: Secondary | ICD-10-CM | POA: Diagnosis not present

## 2019-05-03 DIAGNOSIS — Z992 Dependence on renal dialysis: Secondary | ICD-10-CM | POA: Diagnosis not present

## 2019-05-03 DIAGNOSIS — D509 Iron deficiency anemia, unspecified: Secondary | ICD-10-CM | POA: Diagnosis not present

## 2019-05-03 DIAGNOSIS — N186 End stage renal disease: Secondary | ICD-10-CM | POA: Diagnosis not present

## 2019-05-03 DIAGNOSIS — N2581 Secondary hyperparathyroidism of renal origin: Secondary | ICD-10-CM | POA: Diagnosis not present

## 2019-05-05 DIAGNOSIS — D509 Iron deficiency anemia, unspecified: Secondary | ICD-10-CM | POA: Diagnosis not present

## 2019-05-05 DIAGNOSIS — N186 End stage renal disease: Secondary | ICD-10-CM | POA: Diagnosis not present

## 2019-05-05 DIAGNOSIS — Z992 Dependence on renal dialysis: Secondary | ICD-10-CM | POA: Diagnosis not present

## 2019-05-05 DIAGNOSIS — N2581 Secondary hyperparathyroidism of renal origin: Secondary | ICD-10-CM | POA: Diagnosis not present

## 2019-05-07 DIAGNOSIS — N186 End stage renal disease: Secondary | ICD-10-CM | POA: Diagnosis not present

## 2019-05-07 DIAGNOSIS — D509 Iron deficiency anemia, unspecified: Secondary | ICD-10-CM | POA: Diagnosis not present

## 2019-05-07 DIAGNOSIS — Z992 Dependence on renal dialysis: Secondary | ICD-10-CM | POA: Diagnosis not present

## 2019-05-07 DIAGNOSIS — N2581 Secondary hyperparathyroidism of renal origin: Secondary | ICD-10-CM | POA: Diagnosis not present

## 2019-05-10 DIAGNOSIS — D509 Iron deficiency anemia, unspecified: Secondary | ICD-10-CM | POA: Diagnosis not present

## 2019-05-10 DIAGNOSIS — Z992 Dependence on renal dialysis: Secondary | ICD-10-CM | POA: Diagnosis not present

## 2019-05-10 DIAGNOSIS — N186 End stage renal disease: Secondary | ICD-10-CM | POA: Diagnosis not present

## 2019-05-10 DIAGNOSIS — N2581 Secondary hyperparathyroidism of renal origin: Secondary | ICD-10-CM | POA: Diagnosis not present

## 2019-05-11 DIAGNOSIS — I2510 Atherosclerotic heart disease of native coronary artery without angina pectoris: Secondary | ICD-10-CM | POA: Diagnosis not present

## 2019-05-11 DIAGNOSIS — N186 End stage renal disease: Secondary | ICD-10-CM | POA: Diagnosis not present

## 2019-05-11 DIAGNOSIS — I951 Orthostatic hypotension: Secondary | ICD-10-CM | POA: Diagnosis not present

## 2019-05-11 DIAGNOSIS — K219 Gastro-esophageal reflux disease without esophagitis: Secondary | ICD-10-CM | POA: Diagnosis not present

## 2019-05-12 DIAGNOSIS — N186 End stage renal disease: Secondary | ICD-10-CM | POA: Diagnosis not present

## 2019-05-12 DIAGNOSIS — N2581 Secondary hyperparathyroidism of renal origin: Secondary | ICD-10-CM | POA: Diagnosis not present

## 2019-05-12 DIAGNOSIS — D509 Iron deficiency anemia, unspecified: Secondary | ICD-10-CM | POA: Diagnosis not present

## 2019-05-12 DIAGNOSIS — Z992 Dependence on renal dialysis: Secondary | ICD-10-CM | POA: Diagnosis not present

## 2019-05-14 DIAGNOSIS — D509 Iron deficiency anemia, unspecified: Secondary | ICD-10-CM | POA: Diagnosis not present

## 2019-05-14 DIAGNOSIS — N2581 Secondary hyperparathyroidism of renal origin: Secondary | ICD-10-CM | POA: Diagnosis not present

## 2019-05-14 DIAGNOSIS — Z992 Dependence on renal dialysis: Secondary | ICD-10-CM | POA: Diagnosis not present

## 2019-05-14 DIAGNOSIS — N186 End stage renal disease: Secondary | ICD-10-CM | POA: Diagnosis not present

## 2019-05-17 DIAGNOSIS — D509 Iron deficiency anemia, unspecified: Secondary | ICD-10-CM | POA: Diagnosis not present

## 2019-05-17 DIAGNOSIS — Z992 Dependence on renal dialysis: Secondary | ICD-10-CM | POA: Diagnosis not present

## 2019-05-17 DIAGNOSIS — N2581 Secondary hyperparathyroidism of renal origin: Secondary | ICD-10-CM | POA: Diagnosis not present

## 2019-05-17 DIAGNOSIS — N186 End stage renal disease: Secondary | ICD-10-CM | POA: Diagnosis not present

## 2019-05-18 ENCOUNTER — Encounter: Payer: Self-pay | Admitting: Orthopedic Surgery

## 2019-05-18 ENCOUNTER — Other Ambulatory Visit: Payer: Self-pay

## 2019-05-18 ENCOUNTER — Ambulatory Visit (INDEPENDENT_AMBULATORY_CARE_PROVIDER_SITE_OTHER): Payer: Medicare Other | Admitting: Orthopedic Surgery

## 2019-05-18 VITALS — Ht 65.0 in | Wt 153.0 lb

## 2019-05-18 DIAGNOSIS — Z89431 Acquired absence of right foot: Secondary | ICD-10-CM

## 2019-05-18 NOTE — Progress Notes (Signed)
Office Visit Note   Patient: Joseph LIEW Sr.           Date of Birth: 1940-10-28           MRN: [id number] Visit Date: 05/18/2019              Requested by: [name], MD [address] [place],  [place] [address] PCP: [name], MD  Chief Complaint  Patient presents with  . Right Foot - Follow-up    05/09/2017 right transmet amputation       HPI: This is a pleasant gentleman who is approximately 2 years status post right transmetatarsal amputation he comes in periodically for trimming of the callus on the plantar surface of the amputation stump.  He has no other complaints  Assessment & Plan: Visit Diagnoses: No diagnosis found.  Plan: We will follow up in a month or sooner if anything changes  Follow-Up Instructions: No follow-ups on file.   Ortho Exam  Patient is alert, oriented, no adenopathy, well-dressed, normal affect, normal respiratory effort. Right lower extremity demonstrates healed amputation stump he has thickened callus on the plantar surface.  No cellulitis no ulcers.  After obtaining verbal consent the callus was trimmed to healthy pink surfaces there was no bleeding  Imaging: No results found. No images are attached to the encounter.  Labs: Lab Results  Component Value Date   REPTSTATUS 06/17/2018 FINAL 06/12/2018   GRAMSTAIN  02/18/2017    RARE WBC PRESENT, PREDOMINANTLY PMN FEW GRAM POSITIVE COCCI    CULT  06/12/2018    NO GROWTH 5 DAYS Performed at [facility], [address], [place], [place] [address]      Lab Results  Component Value Date   ALBUMIN 2.8 (L) 12/02/2018   ALBUMIN 3.0 (L) 12/01/2018   ALBUMIN 1.9 (L) 06/16/2018    Lab Results  Component Value Date   MG 2.0 06/17/2018   MG 1.9 06/14/2018   MG 1.8 06/13/2018   No results found for: VD25OH  No results found for: PREALBUMIN CBC EXTENDED Latest Ref Rng & Units 02/10/2019 02/09/2019 12/31/2018  WBC 4.0 - 10.5 K/uL 13.9(H) - 7.3  RBC 4.22 - 5.81 MIL/uL  6.00(H) - 6.58(H)  HGB 13.0 - 17.0 g/dL 13.2 16.7 13.8  HCT 39.0 - 52.0 % 40.1 49.0 44.3  PLT 150 - 400 K/uL 249 - 154  NEUTROABS 1.7 - 7.7 K/uL - - -  LYMPHSABS 0.7 - 4.0 K/uL - - -     Body mass index is 25.46 kg/m.  Orders:  No orders of the defined types were placed in this encounter.  No orders of the defined types were placed in this encounter.    Procedures: No procedures performed  Clinical Data: No additional findings.  ROS:  All other systems negative, except as noted in the HPI. Review of Systems  Objective: Vital Signs: Ht 5\' 5"  (1.651 m)   Wt 153 lb (69.4 kg)   BMI 25.46 kg/m   Specialty Comments:  No specialty comments available.  PMFS History: Patient Active Problem List   Diagnosis Date Noted  . Malnutrition of moderate degree 06/18/2018  . Severe sepsis ([place]) 06/12/2018  . Leukocytosis 06/12/2018  . MRSA (methicillin resistant staph aureus) culture positive 06/12/2018  . Essential hypertension 06/12/2018  . ESRD (end stage renal disease) ([place]) 06/09/2018  . Gangrene of finger ([place]) 06/04/2018  . S/P transmetatarsal amputation of foot, right ([place]) 05/09/2017  . PVD (peripheral vascular disease) ([place]) 04/15/2017  .  Nonischemic cardiomyopathy ([place]) 08/15/2015  . Aortic stenosis 08/15/2015  . Moderate aortic stenosis 07/12/2015  . 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 CAD- 40-50% LAD at cath 07/11/15 07/11/2015  . Aspirin intolerance 07/11/2015  . Abnormal stress test   . ESRD on dialysis ([place]) 05/30/2015  . CTS (carpal tunnel syndrome) 02/28/2015  . PVD of LE - Dr Oneida Alar follows 08/20/2012  . Midfoot skin ulcer, right, limited to breakdown of skin ([place]) 01/16/2012  . Encounter for screening colonoscopy 10/08/2011  . Melena 10/08/2011  . Other complications due to renal dialysis device, implant, and graft 09/24/2011   Past Medical History:  Diagnosis Date  . Anxiety   . Aortic stenosis   . Arthritis    left hand  . CVA  (cerebral infarction)   . ESRD (end stage renal disease) on dialysis [facility])    M/W/F at [facility] in [place]  . Essential hypertension    resolved with HD  . Gangrene of right foot ([place])   . Gastric ulcer 2004  . GI bleed    gastric ulcer  . Heart murmur   . History of blood transfusion   . History of cardiomyopathy    LVEF normal as of February 2017  . History of gastric ulcer   . History of stroke    Left side weakness  . Iron deficiency anemia   . Peripheral vascular disease ([place])   . Stroke  [facility]) 2000   limp left    Family History  Problem Relation Age of Onset  . Hypertension Mother   . Colon cancer Neg Hx   . Liver disease Neg Hx     Past Surgical History:  Procedure Laterality Date  . ABDOMINAL AORTAGRAM N/A 01/24/2012   Procedure: ABDOMINAL Maxcine Ham;  Surgeon: Elam Dutch, MD;  Location: [facility] CATH LAB;  Service: Cardiovascular;  Laterality: N/A;  . ABDOMINAL AORTOGRAM W/LOWER EXTREMITY N/A 04/15/2017   Procedure: ABDOMINAL AORTOGRAM W/LOWER EXTREMITY;  Surgeon: Angelia Mould, MD;  Location: [place] CV LAB;  Service: Cardiovascular;  Laterality: N/A;  . AMPUTATION Right 05/09/2017   Procedure: RIGHT TRANSMETATARSAL AMPUTATION;  Surgeon: [name], MD;  Location: [place];  Service: Orthopedics;  Laterality: Right;  . AMPUTATION Right 06/13/2018   Procedure: AMPUTATION RIGHT LONG FINGER;  Surgeon: Dayna Barker, MD;  Location: [place];  Service: Plastics;  Laterality: Right;  . ARTERIOVENOUS GRAFT PLACEMENT Right right arm  . AV FISTULA PLACEMENT Left 08/31/2015   Procedure: ARTERIOVENOUS (AV) FISTULA CREATION- LEFT RADIOCEPHALIC;  Surgeon: Mal Misty, MD;  Location: [place];  Service: Vascular;  Laterality: Left;  . AV FISTULA PLACEMENT Right 02/18/2017   Procedure: INSERTION OF ARTERIOVENOUS (AV) GORE-TEX GRAFT  RIGHT UPPER ARM;  Surgeon: Conrad Fletcher, MD;  Location: [place];  Service: Vascular;  Laterality: Right;  . AV FISTULA PLACEMENT Left 12/01/2018     Procedure: INSERTION OF ARTERIOVENOUS (AV) GORE-TEX GRAFT ARM;  Surgeon: Angelia Mould, MD;  Location: [place];  Service: Vascular;  Laterality: Left;  . AV FISTULA PLACEMENT Left 02/09/2019   Procedure: INSERTION OF ARTERIOVENOUS (AV) GORE-TEX GRAFT LEFT THIGH;  Surgeon: Waynetta Sandy, MD;  Location: [place];  Service: Vascular;  Laterality: Left;  . BASCILIC VEIN TRANSPOSITION Left 12/19/2015   Procedure: FIRST STAGE BRACHIAL VEIN TRANSPOSITION;  Surgeon: Conrad [place]en, MD;  Location: [place];  Service: Vascular;  Laterality: Left;  . BASCILIC VEIN TRANSPOSITION Left 02/08/2016   Procedure: SECOND STAGE BRACHIAL VEIN TRANSPOSITION;  Surgeon:  Conrad [place], MD;  Location: [place];  Service: Vascular;  Laterality: Left;  . CARDIAC CATHETERIZATION N/A 07/11/2015   Procedure: Left Heart Cath and Coronary Angiography;  Surgeon: Troy Sine, MD;  Location: [place] CV LAB;  Service: Cardiovascular;  Laterality: N/A;  . Carpel Tunnel Left Dec. 22, 2016  . CHOLECYSTECTOMY    . COLONOSCOPY  2004   Dr. Irving Shows, left sided diverticula and cecal polyp, path unknown  . COLONOSCOPY  10/29/2011   Procedure: COLONOSCOPY;  Surgeon: Daneil Dolin, MD;  [facility];  Service: Endoscopy;  Laterality: N/A;  10:15  . ESOPHAGOGASTRODUODENOSCOPY  11/2002   Dr. Gala Romney, erosive reflux esophagitis, multiple gastric ulcer and antral/bulbar erosions. Serologies positive for H.Pylori and was treated  . ESOPHAGOGASTRODUODENOSCOPY  11/20014   Dr. Gala Romney, small hh only, ulcers healed  . ESOPHAGOGASTRODUODENOSCOPY  09/21/2011   Dr Trevor Iha HH, antral erosions, ?early GAVE  . FISTULOGRAM Left 12/10/2016   Procedure: THROMBECTOMY OF LEFT ARM ARTERIOVENOUS FISTULA;  Surgeon: Waynetta Sandy, MD;  Location: [place];  Service: Vascular;  Laterality: Left;  . INSERTION OF DIALYSIS CATHETER Left 12/10/2016   Procedure: INSERTION OF TUNNELED DIALYSIS CATHETER;  Surgeon: Waynetta Sandy, MD;  Location: [place];  Service: Vascular;  Laterality: Left;  . INSERTION OF DIALYSIS CATHETER Right 06/09/2018   Procedure: INSERTION OF DIALYSIS CATHETER Right subclavian;  Surgeon: Angelia Mould, MD;  Location: [place];  Service: Vascular;  Laterality: Right;  . IR DIALY SHUNT INTRO [place] W/IMG RIGHT Right 01/01/2018  . IR GENERIC HISTORICAL  07/16/2016   IR REMOVAL TUN CV CATH W/O FL 07/16/2016 Saverio Danker, PA-C [facility]  . IR PTA ADDL CENTRAL DIALYSIS SEG THRU DIALY CIRCUIT RIGHT Right 10/21/2017  . IR REMOVAL TUN CV CATH W/O FL  05/12/2017  . IR THROMBECTOMY AV FISTULA W/THROMBOLYSIS/PTA INC/SHUNT/IMG RIGHT Right 10/21/2017  . IR THROMBECTOMY AV FISTULA W/THROMBOLYSIS/PTA INC/SHUNT/IMG RIGHT Right 11/27/2017  . IR US GUIDE VASC ACCESS RIGHT  10/21/2017  . IR US GUIDE VASC ACCESS RIGHT  11/27/2017  . IR US GUIDE VASC ACCESS RIGHT  01/01/2018  . LIGATION ARTERIOVENOUS GORTEX GRAFT Right 06/09/2018  . LIGATION ARTERIOVENOUS GORTEX GRAFT Right 06/09/2018   Procedure: LIGATION ARTERIOVENOUS GORTEX GRAFT RIGHT ARM;  Surgeon: Angelia Mould, MD;  Location: [place];  Service: Vascular;  Laterality: Right;  . LIGATION ARTERIOVENOUS GORTEX GRAFT Left 12/31/2018   Procedure: LIGATION OF LEFT UPPER ARM ARTERIVENOUS GORTEX GRAFT, LEFT BRACHIAL ARTERY ENDARTERECTOMY WITH BOVINE PATCH ANGIOPLASTY;  Surgeon: Waynetta Sandy, MD;  Location: [place];  Service: Vascular;  Laterality: Left;  . LIGATION OF ARTERIOVENOUS  FISTULA Left 12/19/2015   Procedure: LIGATION OF RADIOCEPHALIC ARTERIOVENOUS  FISTULA;  Surgeon: Conrad Henrietta, MD;  Location: [place];  Service: Vascular;  Laterality: Left;  Marland Kitchen MASS EXCISION Right 02/18/2017   Procedure: EXCISION OF RIGHT AXILLARY EPIDERMAL INCLUSION CYST;  Surgeon: Conrad [place], MD;  Location: [place];  Service: Vascular;  Laterality: Right;  . PERIPHERAL VASCULAR CATHETERIZATION N/A 12/14/2015   Procedure: Fistulagram;  Surgeon: Conrad [place], MD;  Location: [place] CV LAB;  Service: Cardiovascular;  Laterality: N/A;  . SHOULDER SURGERY Right    fracture  . ULTRASOUND GUIDANCE FOR VASCULAR ACCESS  04/15/2017   Procedure: Ultrasound Guidance For Vascular Access;  Surgeon: Angelia Mould, MD;  Location: [place] CV LAB;  Service: Cardiovascular;;  . UPPER EXTREMITY VENOGRAPHY Bilateral 12/17/2016   Procedure: Bilateral Upper Extremity  Venography;  Surgeon: Serafina Mitchell, MD;  Location: [place] CV LAB;  Service: Cardiovascular;  Laterality: Bilateral;  . UPPER EXTREMITY VENOGRAPHY Left 11/03/2018   Procedure: UPPER EXTREMITY VENOGRAPHY;  Surgeon: Serafina Mitchell, MD;  Location: [place] CV LAB;  Service: Cardiovascular;  Laterality: Left;   Social History   Occupational History  . Occupation: retired, Licensed conveyancer    Employer: RETIRED  Tobacco Use  . Smoking status: Former Smoker    Years: 25.00    Quit date: 04/29/2004    Years since quitting: 15.0  . Smokeless tobacco: Former Systems developer    Types: Chew    Quit date: 01/16/1987  . Tobacco comment: quit 2006  Substance and Sexual Activity  . Alcohol use: No  . Drug use: No  . Sexual activity: Not on file

## 2019-05-19 DIAGNOSIS — Z992 Dependence on renal dialysis: Secondary | ICD-10-CM | POA: Diagnosis not present

## 2019-05-19 DIAGNOSIS — N186 End stage renal disease: Secondary | ICD-10-CM | POA: Diagnosis not present

## 2019-05-19 DIAGNOSIS — D509 Iron deficiency anemia, unspecified: Secondary | ICD-10-CM | POA: Diagnosis not present

## 2019-05-19 DIAGNOSIS — N2581 Secondary hyperparathyroidism of renal origin: Secondary | ICD-10-CM | POA: Diagnosis not present

## 2019-05-21 DIAGNOSIS — N2581 Secondary hyperparathyroidism of renal origin: Secondary | ICD-10-CM | POA: Diagnosis not present

## 2019-05-21 DIAGNOSIS — N186 End stage renal disease: Secondary | ICD-10-CM | POA: Diagnosis not present

## 2019-05-21 DIAGNOSIS — Z992 Dependence on renal dialysis: Secondary | ICD-10-CM | POA: Diagnosis not present

## 2019-05-21 DIAGNOSIS — D509 Iron deficiency anemia, unspecified: Secondary | ICD-10-CM | POA: Diagnosis not present

## 2019-05-24 DIAGNOSIS — N2581 Secondary hyperparathyroidism of renal origin: Secondary | ICD-10-CM | POA: Diagnosis not present

## 2019-05-24 DIAGNOSIS — D509 Iron deficiency anemia, unspecified: Secondary | ICD-10-CM | POA: Diagnosis not present

## 2019-05-24 DIAGNOSIS — N186 End stage renal disease: Secondary | ICD-10-CM | POA: Diagnosis not present

## 2019-05-24 DIAGNOSIS — Z992 Dependence on renal dialysis: Secondary | ICD-10-CM | POA: Diagnosis not present

## 2019-05-26 DIAGNOSIS — N186 End stage renal disease: Secondary | ICD-10-CM | POA: Diagnosis not present

## 2019-05-26 DIAGNOSIS — N2581 Secondary hyperparathyroidism of renal origin: Secondary | ICD-10-CM | POA: Diagnosis not present

## 2019-05-26 DIAGNOSIS — Z992 Dependence on renal dialysis: Secondary | ICD-10-CM | POA: Diagnosis not present

## 2019-05-26 DIAGNOSIS — D509 Iron deficiency anemia, unspecified: Secondary | ICD-10-CM | POA: Diagnosis not present

## 2019-05-28 ENCOUNTER — Emergency Department (HOSPITAL_COMMUNITY): Payer: Medicare Other

## 2019-05-28 ENCOUNTER — Encounter (HOSPITAL_COMMUNITY): Payer: Self-pay | Admitting: *Deleted

## 2019-05-28 ENCOUNTER — Other Ambulatory Visit: Payer: Self-pay

## 2019-05-28 ENCOUNTER — Emergency Department (HOSPITAL_COMMUNITY)
Admission: EM | Admit: 2019-05-28 | Discharge: 2019-05-28 | Disposition: A | Discharge status: Home / Self Care | Payer: Medicare Other | Attending: Emergency Medicine | Admitting: Emergency Medicine

## 2019-05-28 DIAGNOSIS — I120 Hypertensive chronic kidney disease with stage 5 chronic kidney disease or end stage renal disease: Secondary | ICD-10-CM | POA: Diagnosis not present

## 2019-05-28 DIAGNOSIS — N2581 Secondary hyperparathyroidism of renal origin: Secondary | ICD-10-CM | POA: Diagnosis not present

## 2019-05-28 DIAGNOSIS — R1033 Periumbilical pain: Secondary | ICD-10-CM | POA: Diagnosis present

## 2019-05-28 DIAGNOSIS — K529 Noninfective gastroenteritis and colitis, unspecified: Secondary | ICD-10-CM | POA: Insufficient documentation

## 2019-05-28 DIAGNOSIS — D509 Iron deficiency anemia, unspecified: Secondary | ICD-10-CM | POA: Diagnosis not present

## 2019-05-28 DIAGNOSIS — I4510 Unspecified right bundle-branch block: Secondary | ICD-10-CM | POA: Diagnosis not present

## 2019-05-28 DIAGNOSIS — Z79899 Other long term (current) drug therapy: Secondary | ICD-10-CM | POA: Insufficient documentation

## 2019-05-28 DIAGNOSIS — K567 Ileus, unspecified: Secondary | ICD-10-CM | POA: Diagnosis not present

## 2019-05-28 DIAGNOSIS — R109 Unspecified abdominal pain: Secondary | ICD-10-CM

## 2019-05-28 DIAGNOSIS — Z992 Dependence on renal dialysis: Secondary | ICD-10-CM | POA: Diagnosis not present

## 2019-05-28 DIAGNOSIS — Z87891 Personal history of nicotine dependence: Secondary | ICD-10-CM | POA: Diagnosis not present

## 2019-05-28 DIAGNOSIS — N186 End stage renal disease: Secondary | ICD-10-CM | POA: Diagnosis not present

## 2019-05-28 DIAGNOSIS — I2510 Atherosclerotic heart disease of native coronary artery without angina pectoris: Secondary | ICD-10-CM | POA: Insufficient documentation

## 2019-05-28 DIAGNOSIS — R1013 Epigastric pain: Secondary | ICD-10-CM | POA: Diagnosis not present

## 2019-05-28 LAB — COMPREHENSIVE METABOLIC PANEL
ALT: 16 U/L (ref 0–44)
AST: 21 U/L (ref 15–41)
Albumin: 3.7 g/dL (ref 3.5–5.0)
Alkaline Phosphatase: 72 U/L (ref 38–126)
Anion gap: 16 — ABNORMAL HIGH (ref 5–15)
BUN: 20 mg/dL (ref 8–23)
CO2: 27 mmol/L (ref 22–32)
Calcium: 9.6 mg/dL (ref 8.9–10.3)
Chloride: 93 mmol/L — ABNORMAL LOW (ref 98–111)
Creatinine, Ser: 7.63 mg/dL — ABNORMAL HIGH (ref 0.61–1.24)
GFR calc Af Amer: 7 mL/min — ABNORMAL LOW (ref >60)
GFR calc non Af Amer: 6 mL/min — ABNORMAL LOW (ref >60)
Glucose, Bld: 140 mg/dL — ABNORMAL HIGH (ref 70–99)
Potassium: 3.6 mmol/L (ref 3.5–5.1)
Sodium: 136 mmol/L (ref 135–145)
Total Bilirubin: 0.8 mg/dL (ref 0.3–1.2)
Total Protein: 8.7 g/dL — ABNORMAL HIGH (ref 6.5–8.1)

## 2019-05-28 LAB — CBC WITH DIFFERENTIAL/PLATELET
Abs Immature Granulocytes: 0.02 10*3/uL (ref 0.00–0.07)
Basophils Absolute: 0.1 10*3/uL (ref 0.0–0.1)
Basophils Relative: 1 %
Eosinophils Absolute: 0.5 10*3/uL (ref 0.0–0.5)
Eosinophils Relative: 3 %
HCT: 53 % — ABNORMAL HIGH (ref 39.0–52.0)
Hemoglobin: 16.8 g/dL (ref 13.0–17.0)
Immature Granulocytes: 0 %
Lymphocytes Relative: 39 %
Lymphs Abs: 5.2 10*3/uL — ABNORMAL HIGH (ref 0.7–4.0)
MCH: 21.6 pg — ABNORMAL LOW (ref 26.0–34.0)
MCHC: 31.7 g/dL (ref 30.0–36.0)
MCV: 68.2 fL — ABNORMAL LOW (ref 80.0–100.0)
Monocytes Absolute: 1.2 10*3/uL — ABNORMAL HIGH (ref 0.1–1.0)
Monocytes Relative: 9 %
Neutro Abs: 6.5 10*3/uL (ref 1.7–7.7)
Neutrophils Relative %: 48 %
Platelets: 324 10*3/uL (ref 150–400)
RBC: 7.77 MIL/uL — ABNORMAL HIGH (ref 4.22–5.81)
RDW: 20.4 % — ABNORMAL HIGH (ref 11.5–15.5)
WBC: 13.5 10*3/uL — ABNORMAL HIGH (ref 4.0–10.5)
nRBC: 0 % (ref 0.0–0.2)

## 2019-05-28 LAB — LIPASE, BLOOD: Lipase: 31 U/L (ref 11–51)

## 2019-05-28 MED ORDER — HYDROMORPHONE HCL 1 MG/ML IJ SOLN
1.0000 mg | Freq: Once | INTRAMUSCULAR | Status: AC
  Administered 2019-05-28: 1 mg via INTRAMUSCULAR
  Filled 2019-05-28: qty 1

## 2019-05-28 MED ORDER — PROMETHAZINE HCL 25 MG/ML IJ SOLN
25.0000 mg | Freq: Once | INTRAMUSCULAR | Status: AC
  Administered 2019-05-28: 25 mg via INTRAMUSCULAR
  Filled 2019-05-28: qty 1

## 2019-05-28 NOTE — ED Triage Notes (Signed)
Pt c/o abd pain with dry heaves that started after dialysis today,

## 2019-05-28 NOTE — ED Notes (Signed)
Pt family updated and informed about d/c papers. Family on the way ETA 15 min. Told patient will be in lobby waiting.

## 2019-05-28 NOTE — ED Notes (Signed)
Patient 02 saturation 100 percent room air.

## 2019-05-28 NOTE — Discharge Instructions (Addendum)
Continue dialysis as previously scheduled.  Return to the emergency department if you develop worsening pain, high fever, bloody stool, or other new and concerning symptoms.

## 2019-05-28 NOTE — ED Notes (Signed)
Placed hot packs on patients hand to be able to obtain pulse sat.

## 2019-05-28 NOTE — ED Provider Notes (Signed)
[facility] EMERGENCY DEPARTMENT Provider Note   CSN: [id number] Arrival date & time: 05/28/19  1856     History Chief Complaint  Patient presents with  . Abdominal Pain    [name]. is a 79 y.o. male.  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  He presents today for evaluation of abdominal pain, nausea, and vomiting.  This started after completing his dialysis session today.  He describes cramping in his periumbilical region that occurs intermittently.  He denies fevers or chills.  He denies any bowel complaints.  The history is provided by the patient.  Abdominal Pain Pain location:  Periumbilical Pain quality: cramping   Pain radiates to:  Does not radiate Pain severity:  Moderate Onset quality:  Sudden Duration:  2 hours Timing:  Constant Progression:  Worsening Chronicity:  New Relieved by:  Nothing Worsened by:  Nothing Ineffective treatments:  None tried      Past Medical History:  Diagnosis Date  . Anxiety   . Aortic stenosis   . Arthritis    left hand  . CVA (cerebral infarction)   . ESRD (end stage renal disease) on dialysis [facility]    M/W/F at [facility] in [place]  . Essential hypertension    resolved with HD  . Gangrene of right foot ([place])   . Gastric ulcer 2004  . GI bleed    gastric ulcer  . Heart murmur   . History of blood transfusion   . History of cardiomyopathy    LVEF normal as of February 2017  . History of gastric ulcer   . History of stroke    Left side weakness  . Iron deficiency anemia   . Peripheral vascular disease ([place])   . Stroke [facility]) 2000   limp left    Patient Active Problem List   Diagnosis Date Noted  . Malnutrition of moderate degree 06/18/2018  . Severe sepsis ([place]) 06/12/2018  . Leukocytosis 06/12/2018  . MRSA (methicillin resistant staph aureus) culture positive 06/12/2018  .  Essential hypertension 06/12/2018  . ESRD (end stage renal disease) ([place]) 06/09/2018  . Gangrene of finger ([place]) 06/04/2018  . S/P transmetatarsal amputation of foot, right ([place]) 05/09/2017  . PVD (peripheral vascular disease) ([place]) 04/15/2017  . Nonischemic cardiomyopathy ([place]) 08/15/2015  . Aortic stenosis 08/15/2015  . Moderate aortic stenosis 07/12/2015  . 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 CAD- 40-50% LAD at cath 07/11/15 07/11/2015  . Aspirin intolerance 07/11/2015  . Abnormal stress test   . ESRD on dialysis ([place]) 05/30/2015  . CTS (carpal tunnel syndrome) 02/28/2015  . PVD of LE - Dr Oneida Alar follows 08/20/2012  . Midfoot skin ulcer, right, limited to breakdown of skin ([place]) 01/16/2012  . Encounter for screening colonoscopy 10/08/2011  . Melena 10/08/2011  . Other complications due to renal dialysis device, implant, and graft 09/24/2011    Past Surgical History:  Procedure Laterality Date  . ABDOMINAL AORTAGRAM N/A 01/24/2012   Procedure: ABDOMINAL Maxcine Ham;  Surgeon: Elam Dutch, MD;  Location: [facility] CATH LAB;  Service: Cardiovascular;  Laterality: N/A;  . ABDOMINAL AORTOGRAM W/LOWER EXTREMITY N/A 04/15/2017   Procedure: ABDOMINAL AORTOGRAM W/LOWER EXTREMITY;  Surgeon: Angelia Mould, MD;  Location: [place] CV LAB;  Service: Cardiovascular;  Laterality: N/A;  . AMPUTATION Right 05/09/2017   Procedure: RIGHT TRANSMETATARSAL AMPUTATION;  Surgeon: [name], MD;  Location:  [place] OR;  Service: Orthopedics;  Laterality: Right;  . AMPUTATION Right 06/13/2018   Procedure: AMPUTATION RIGHT LONG FINGER;  Surgeon: Dayna Barker, MD;  Location: [place];  Service: Plastics;  Laterality: Right;  . ARTERIOVENOUS GRAFT PLACEMENT Right right arm  . AV FISTULA PLACEMENT Left 08/31/2015   Procedure: ARTERIOVENOUS (AV) FISTULA CREATION- LEFT RADIOCEPHALIC;  Surgeon: Mal Misty, MD;  Location: [place];  Service: Vascular;  Laterality: Left;  . AV FISTULA PLACEMENT  Right 02/18/2017   Procedure: INSERTION OF ARTERIOVENOUS (AV) GORE-TEX GRAFT  RIGHT UPPER ARM;  Surgeon: Conrad [place]hel, MD;  Location: [place];  Service: Vascular;  Laterality: Right;  . AV FISTULA PLACEMENT Left 12/01/2018   Procedure: INSERTION OF ARTERIOVENOUS (AV) GORE-TEX GRAFT ARM;  Surgeon: Angelia Mould, MD;  Location: [place];  Service: Vascular;  Laterality: Left;  . AV FISTULA PLACEMENT Left 02/09/2019   Procedure: INSERTION OF ARTERIOVENOUS (AV) GORE-TEX GRAFT LEFT THIGH;  Surgeon: Waynetta Sandy, MD;  Location: [place];  Service: Vascular;  Laterality: Left;  . BASCILIC VEIN TRANSPOSITION Left 12/19/2015   Procedure: FIRST STAGE BRACHIAL VEIN TRANSPOSITION;  Surgeon: Conrad [place], MD;  Location: [place];  Service: Vascular;  Laterality: Left;  . BASCILIC VEIN TRANSPOSITION Left 02/08/2016   Procedure: SECOND STAGE BRACHIAL VEIN TRANSPOSITION;  Surgeon: Conrad [place], MD;  Location: [place];  Service: Vascular;  Laterality: Left;  . CARDIAC CATHETERIZATION N/A 07/11/2015   Procedure: Left Heart Cath and Coronary Angiography;  Surgeon: Troy Sine, MD;  Location: [place] CV LAB;  Service: Cardiovascular;  Laterality: N/A;  . Carpel Tunnel Left Dec. 22, 2016  . CHOLECYSTECTOMY    . COLONOSCOPY  2004   Dr. Irving Shows, left sided diverticula and cecal polyp, path unknown  . COLONOSCOPY  10/29/2011   Procedure: COLONOSCOPY;  Surgeon: Daneil Dolin, MD;  [facility];  Service: Endoscopy;  Laterality: N/A;  10:15  . ESOPHAGOGASTRODUODENOSCOPY  11/2002   Dr. Gala Romney, erosive reflux esophagitis, multiple gastric ulcer and antral/bulbar erosions. Serologies positive for H.Pylori and was treated  . ESOPHAGOGASTRODUODENOSCOPY  11/20014   Dr. Gala Romney, small hh only, ulcers healed  . ESOPHAGOGASTRODUODENOSCOPY  09/21/2011   Dr Trevor Iha [facility], antral erosions, ?early GAVE  . FISTULOGRAM Left 12/10/2016   Procedure: THROMBECTOMY OF LEFT ARM ARTERIOVENOUS FISTULA;  Surgeon: Waynetta Sandy, MD;  Location: [place];  Service: Vascular;  Laterality: Left;  . INSERTION OF DIALYSIS CATHETER Left 12/10/2016   Procedure: INSERTION OF TUNNELED DIALYSIS CATHETER;  Surgeon: Waynetta Sandy, MD;  Location: [place];  Service: Vascular;  Laterality: Left;  . INSERTION OF DIALYSIS CATHETER Right 06/09/2018   Procedure: INSERTION OF DIALYSIS CATHETER Right subclavian;  Surgeon: Angelia Mould, MD;  Location: [place];  Service: Vascular;  Laterality: Right;  . IR DIALY SHUNT INTRO [place] W/IMG RIGHT Right 01/01/2018  . IR GENERIC HISTORICAL  07/16/2016   IR REMOVAL TUN CV CATH W/O FL 07/16/2016 Saverio Danker, PA-C [facility]-INTERV RAD  . IR PTA ADDL CENTRAL DIALYSIS SEG THRU DIALY CIRCUIT RIGHT Right 10/21/2017  . IR REMOVAL TUN CV CATH W/O FL  05/12/2017  . IR THROMBECTOMY AV FISTULA W/THROMBOLYSIS/PTA INC/SHUNT/IMG RIGHT Right 10/21/2017  . IR THROMBECTOMY AV FISTULA W/THROMBOLYSIS/PTA INC/SHUNT/IMG RIGHT Right 11/27/2017  . IR US GUIDE VASC ACCESS RIGHT  10/21/2017  . IR US GUIDE VASC ACCESS RIGHT  11/27/2017  . IR US GUIDE VASC ACCESS RIGHT  01/01/2018  . LIGATION ARTERIOVENOUS GORTEX GRAFT Right  06/09/2018  . LIGATION ARTERIOVENOUS GORTEX GRAFT Right 06/09/2018   Procedure: LIGATION ARTERIOVENOUS GORTEX GRAFT RIGHT ARM;  Surgeon: Angelia Mould, MD;  Location: [place];  Service: Vascular;  Laterality: Right;  . LIGATION ARTERIOVENOUS GORTEX GRAFT Left 12/31/2018   Procedure: LIGATION OF LEFT UPPER ARM ARTERIVENOUS GORTEX GRAFT, LEFT BRACHIAL ARTERY ENDARTERECTOMY WITH BOVINE PATCH ANGIOPLASTY;  Surgeon: Waynetta Sandy, MD;  Location: [place];  Service: Vascular;  Laterality: Left;  . LIGATION OF ARTERIOVENOUS  FISTULA Left 12/19/2015   Procedure: LIGATION OF RADIOCEPHALIC ARTERIOVENOUS  FISTULA;  Surgeon: Conrad [place], MD;  Location: [place];  Service: Vascular;  Laterality: Left;  Marland Kitchen MASS EXCISION Right 02/18/2017   Procedure: EXCISION OF RIGHT AXILLARY  EPIDERMAL INCLUSION CYST;  Surgeon: Conrad [place], MD;  Location: [place];  Service: Vascular;  Laterality: Right;  . PERIPHERAL VASCULAR CATHETERIZATION N/A 12/14/2015   Procedure: Fistulagram;  Surgeon: Conrad , MD;  Location: [place] CV LAB;  Service: Cardiovascular;  Laterality: N/A;  . SHOULDER SURGERY Right    fracture  . ULTRASOUND GUIDANCE FOR VASCULAR ACCESS  04/15/2017   Procedure: Ultrasound Guidance For Vascular Access;  Surgeon: Angelia Mould, MD;  Location: [place] CV LAB;  Service: Cardiovascular;;  . UPPER EXTREMITY VENOGRAPHY Bilateral 12/17/2016   Procedure: Bilateral Upper Extremity Venography;  Surgeon: Serafina Mitchell, MD;  Location: [place] CV LAB;  Service: Cardiovascular;  Laterality: Bilateral;  . UPPER EXTREMITY VENOGRAPHY Left 11/03/2018   Procedure: UPPER EXTREMITY VENOGRAPHY;  Surgeon: Serafina Mitchell, MD;  Location: [place] CV LAB;  Service: Cardiovascular;  Laterality: Left;       Family History  Problem Relation Age of Onset  . Hypertension Mother   . Colon cancer Neg Hx   . Liver disease Neg Hx     Social History   Tobacco Use  . Smoking status: Former Smoker    Years: 25.00    Quit date: 04/29/2004    Years since quitting: 15.0  . Smokeless tobacco: Former Systems developer    Types: Chew    Quit date: 01/16/1987  . Tobacco comment: quit 2006  Substance Use Topics  . Alcohol use: No  . Drug use: No    Home Medications Prior to Admission medications   Medication Sig Start Date End Date Taking? Authorizing Provider  atorvastatin (LIPITOR) 40 MG tablet Take 40 mg by mouth daily. 11/03/15   [provider]  b complex-vitamin c-folic acid (NEPHRO-VITE) 0.8 MG TABS Take 1 tablet by mouth See admin instructions. Take one tablet daily on Tuesdays, Thursdays, Saturdays, and Sundays.    [provider]  cholecalciferol (VITAMIN D3) 25 MCG (1000 UT) tablet Take 1,000 Units by mouth daily.    [provider]    cinacalcet (SENSIPAR) 60 MG tablet Take 60 mg by mouth every evening.     [provider]  midodrine (PROAMATINE) 10 MG tablet Take 10 mg by mouth every Monday, Wednesday, and Friday. before dialysis. 09/30/18   [provider]  sevelamer (RENVELA) 800 MG tablet Take 1,600-3,200 mg by mouth See admin instructions. Take 3200  mg by mouth 3 times daily with full meals and take 1600 mg by mouth with snacks.    [provider]  vitamin C (ASCORBIC ACID) 500 MG tablet Take 500 mg by mouth daily.    [provider]    Allergies    Aspirin  Review of Systems   Review of Systems  Gastrointestinal: Positive for abdominal  pain.  All other systems reviewed and are negative.   Physical Exam Updated Vital Signs BP 133/75   Pulse 96   Temp 97.9 F (36.6 C)   Resp (!) 36   Ht 5\' 5"  (1.651 m)   Wt 69.4 kg   BMI 25.46 kg/m   Physical Exam Vitals and nursing note reviewed.  Constitutional:      General: He is not in acute distress.    Appearance: He is well-developed. He is not diaphoretic.     Comments: Patient is a chronically ill-appearing male in no acute distress.  He is actively vomiting during exam.  HENT:     Head: Normocephalic and atraumatic.  Cardiovascular:     Rate and Rhythm: Normal rate and regular rhythm.     Heart sounds: No murmur. No friction rub.  Pulmonary:     Effort: Pulmonary effort is normal. No respiratory distress.     Breath sounds: Normal breath sounds. No wheezing or rales.  Abdominal:     General: Bowel sounds are normal. There is no distension.     Palpations: Abdomen is soft.     Tenderness: There is abdominal tenderness in the periumbilical area. There is no right CVA tenderness, left CVA tenderness, guarding or rebound.  Musculoskeletal:        General: Normal range of motion.     Cervical back: Normal range of motion and neck supple.  Skin:    General: Skin is warm and dry.  Neurological:     Mental Status: He is  alert and oriented to person, place, and time.     Coordination: Coordination normal.     ED Results / Procedures / Treatments   Labs (all labs ordered are listed, but only abnormal results are displayed) Labs Reviewed  COMPREHENSIVE METABOLIC PANEL  LIPASE, BLOOD  CBC WITH DIFFERENTIAL/PLATELET    EKG EKG Interpretation  Date/Time:  Friday May 28 2019 19:12:22 EST Ventricular Rate:  97 PR Interval:    QRS Duration: 161 QT Interval:  395 QTC Calculation: 502 R Axis:   167 Text Interpretation: Sinus tachycardia Paired ventricular premature complexes RBBB and LPFB Confirmed by Veryl Speak [phone]) on 05/28/2019 7:35:29 PM   Radiology No results found.  Procedures Procedures (including critical care time)  Medications Ordered in ED Medications  HYDROmorphone (DILAUDID) injection 1 mg (has no administration in time range)  promethazine (PHENERGAN) injection 25 mg (has no administration in time range)    ED Course  I have reviewed the triage vital signs and the nursing notes.  Pertinent labs & imaging results that were available during my care of the patient were reviewed by me and considered in my medical decision making (see chart for details).   MDM Rules/Calculators/A&P  Patient is a 79 year old male presenting with complaints of abdominal pain and cramping with associated nausea and retching that began shortly after dialysis.  Patient's laboratory studies are unremarkable and CT scan shows evidence for ileus, but no transition point/bowel obstruction.  Patient given pain and nausea medicine and is now feeling better.  At this point, I see no indication for surgery or admission.  Patient seems appropriate for discharge.  To return as needed for any problems.  Final Clinical Impression(s) / ED Diagnoses Final diagnoses:  None    Rx / DC Orders ED Discharge Orders    None       Veryl Speak, MD 05/28/19 2106

## 2019-05-30 DIAGNOSIS — N186 End stage renal disease: Secondary | ICD-10-CM | POA: Diagnosis not present

## 2019-05-30 DIAGNOSIS — Z992 Dependence on renal dialysis: Secondary | ICD-10-CM | POA: Diagnosis not present

## 2019-05-31 DIAGNOSIS — Z992 Dependence on renal dialysis: Secondary | ICD-10-CM | POA: Diagnosis not present

## 2019-05-31 DIAGNOSIS — D509 Iron deficiency anemia, unspecified: Secondary | ICD-10-CM | POA: Diagnosis not present

## 2019-05-31 DIAGNOSIS — N2581 Secondary hyperparathyroidism of renal origin: Secondary | ICD-10-CM | POA: Diagnosis not present

## 2019-05-31 DIAGNOSIS — N186 End stage renal disease: Secondary | ICD-10-CM | POA: Diagnosis not present

## 2019-06-02 DIAGNOSIS — Z992 Dependence on renal dialysis: Secondary | ICD-10-CM | POA: Diagnosis not present

## 2019-06-02 DIAGNOSIS — N186 End stage renal disease: Secondary | ICD-10-CM | POA: Diagnosis not present

## 2019-06-02 DIAGNOSIS — N2581 Secondary hyperparathyroidism of renal origin: Secondary | ICD-10-CM | POA: Diagnosis not present

## 2019-06-02 DIAGNOSIS — D509 Iron deficiency anemia, unspecified: Secondary | ICD-10-CM | POA: Diagnosis not present

## 2019-06-04 DIAGNOSIS — N2581 Secondary hyperparathyroidism of renal origin: Secondary | ICD-10-CM | POA: Diagnosis not present

## 2019-06-04 DIAGNOSIS — Z992 Dependence on renal dialysis: Secondary | ICD-10-CM | POA: Diagnosis not present

## 2019-06-04 DIAGNOSIS — D509 Iron deficiency anemia, unspecified: Secondary | ICD-10-CM | POA: Diagnosis not present

## 2019-06-04 DIAGNOSIS — N186 End stage renal disease: Secondary | ICD-10-CM | POA: Diagnosis not present

## 2019-06-04 DIAGNOSIS — Z1159 Encounter for screening for other viral diseases: Secondary | ICD-10-CM | POA: Diagnosis not present

## 2019-06-07 DIAGNOSIS — N186 End stage renal disease: Secondary | ICD-10-CM | POA: Diagnosis not present

## 2019-06-07 DIAGNOSIS — Z992 Dependence on renal dialysis: Secondary | ICD-10-CM | POA: Diagnosis not present

## 2019-06-07 DIAGNOSIS — N2581 Secondary hyperparathyroidism of renal origin: Secondary | ICD-10-CM | POA: Diagnosis not present

## 2019-06-07 DIAGNOSIS — D509 Iron deficiency anemia, unspecified: Secondary | ICD-10-CM | POA: Diagnosis not present

## 2019-06-08 DIAGNOSIS — I739 Peripheral vascular disease, unspecified: Secondary | ICD-10-CM | POA: Diagnosis not present

## 2019-06-08 DIAGNOSIS — G629 Polyneuropathy, unspecified: Secondary | ICD-10-CM | POA: Diagnosis not present

## 2019-06-08 DIAGNOSIS — S90425A Blister (nonthermal), left lesser toe(s), initial encounter: Secondary | ICD-10-CM | POA: Diagnosis not present

## 2019-06-09 DIAGNOSIS — D509 Iron deficiency anemia, unspecified: Secondary | ICD-10-CM | POA: Diagnosis not present

## 2019-06-09 DIAGNOSIS — N186 End stage renal disease: Secondary | ICD-10-CM | POA: Diagnosis not present

## 2019-06-09 DIAGNOSIS — N2581 Secondary hyperparathyroidism of renal origin: Secondary | ICD-10-CM | POA: Diagnosis not present

## 2019-06-09 DIAGNOSIS — Z992 Dependence on renal dialysis: Secondary | ICD-10-CM | POA: Diagnosis not present

## 2019-06-11 DIAGNOSIS — N2581 Secondary hyperparathyroidism of renal origin: Secondary | ICD-10-CM | POA: Diagnosis not present

## 2019-06-11 DIAGNOSIS — D509 Iron deficiency anemia, unspecified: Secondary | ICD-10-CM | POA: Diagnosis not present

## 2019-06-11 DIAGNOSIS — N186 End stage renal disease: Secondary | ICD-10-CM | POA: Diagnosis not present

## 2019-06-11 DIAGNOSIS — Z992 Dependence on renal dialysis: Secondary | ICD-10-CM | POA: Diagnosis not present

## 2019-06-14 DIAGNOSIS — D509 Iron deficiency anemia, unspecified: Secondary | ICD-10-CM | POA: Diagnosis not present

## 2019-06-14 DIAGNOSIS — N186 End stage renal disease: Secondary | ICD-10-CM | POA: Diagnosis not present

## 2019-06-14 DIAGNOSIS — Z992 Dependence on renal dialysis: Secondary | ICD-10-CM | POA: Diagnosis not present

## 2019-06-14 DIAGNOSIS — N2581 Secondary hyperparathyroidism of renal origin: Secondary | ICD-10-CM | POA: Diagnosis not present

## 2019-06-16 DIAGNOSIS — D509 Iron deficiency anemia, unspecified: Secondary | ICD-10-CM | POA: Diagnosis not present

## 2019-06-16 DIAGNOSIS — N2581 Secondary hyperparathyroidism of renal origin: Secondary | ICD-10-CM | POA: Diagnosis not present

## 2019-06-16 DIAGNOSIS — N186 End stage renal disease: Secondary | ICD-10-CM | POA: Diagnosis not present

## 2019-06-16 DIAGNOSIS — Z992 Dependence on renal dialysis: Secondary | ICD-10-CM | POA: Diagnosis not present

## 2019-06-18 DIAGNOSIS — Z992 Dependence on renal dialysis: Secondary | ICD-10-CM | POA: Diagnosis not present

## 2019-06-18 DIAGNOSIS — N186 End stage renal disease: Secondary | ICD-10-CM | POA: Diagnosis not present

## 2019-06-18 DIAGNOSIS — D509 Iron deficiency anemia, unspecified: Secondary | ICD-10-CM | POA: Diagnosis not present

## 2019-06-18 DIAGNOSIS — N2581 Secondary hyperparathyroidism of renal origin: Secondary | ICD-10-CM | POA: Diagnosis not present

## 2019-06-21 DIAGNOSIS — Z992 Dependence on renal dialysis: Secondary | ICD-10-CM | POA: Diagnosis not present

## 2019-06-21 DIAGNOSIS — D509 Iron deficiency anemia, unspecified: Secondary | ICD-10-CM | POA: Diagnosis not present

## 2019-06-21 DIAGNOSIS — N2581 Secondary hyperparathyroidism of renal origin: Secondary | ICD-10-CM | POA: Diagnosis not present

## 2019-06-21 DIAGNOSIS — N186 End stage renal disease: Secondary | ICD-10-CM | POA: Diagnosis not present

## 2019-06-22 ENCOUNTER — Encounter: Payer: Self-pay | Admitting: Orthopedic Surgery

## 2019-06-22 ENCOUNTER — Other Ambulatory Visit: Payer: Self-pay

## 2019-06-22 ENCOUNTER — Ambulatory Visit (INDEPENDENT_AMBULATORY_CARE_PROVIDER_SITE_OTHER): Payer: Medicare Other | Admitting: Orthopedic Surgery

## 2019-06-22 VITALS — Ht 65.0 in | Wt 153.0 lb

## 2019-06-22 DIAGNOSIS — L97411 Non-pressure chronic ulcer of right heel and midfoot limited to breakdown of skin: Secondary | ICD-10-CM

## 2019-06-22 DIAGNOSIS — Z89431 Acquired absence of right foot: Secondary | ICD-10-CM

## 2019-06-22 NOTE — Progress Notes (Signed)
Office Visit Note   Patient: Joseph WINKELS Sr.           Date of Birth: [birth year]           MRN: [id number] Visit Date: 06/22/2019              Requested by: [name], MD [address] [place],  [place] [address] PCP: [name], MD  Chief Complaint  Patient presents with  . Right Foot - Follow-up    05/09/17 right transmet amputation       HPI: Patient is a 79 year old gentleman who presents in follow-up with ulceration and callus on the plantar aspect of his right transmetatarsal amputation.  He is 2 years out from surgery.  Patient currently uses a cane for ambulation regular shoewear and a spacer.  Assessment & Plan: Visit Diagnoses:  1. S/P transmetatarsal amputation of foot, right ([place])   2. Midfoot skin ulcer, right, limited to breakdown of skin ([place])     Plan: Skin and soft tissue was debrided recommended daily cleansing with soap and water dry dressing change daily minimize weightbearing on the forefoot.  Follow-Up Instructions: Return in about 3 weeks (around 07/13/2019).   Ortho Exam  Patient is alert, oriented, no adenopathy, well-dressed, normal affect, normal respiratory effort. Examination patient has increased ulceration over the plantar distal aspect of the right transmetatarsal amputation there is no ascending cellulitis no drainage.  After informed consent a 10 blade knife was used to debride the skin and soft tissue back to healthy viable granulation tissue there is no exposed bone or tendon.  The ulcer is 3 x 6 cm and 5 mm deep.  Silver nitrate was used for hemostasis a dry dressing was applied.  No purulent drainage no abscess no signs of deep infection.  Imaging: No results found. No images are attached to the encounter.  Labs: Lab Results  Component Value Date   REPTSTATUS 06/17/2018 FINAL 06/12/2018   GRAMSTAIN  02/18/2017    RARE WBC PRESENT, PREDOMINANTLY PMN FEW GRAM POSITIVE COCCI    CULT  06/12/2018    NO GROWTH 5  DAYS Performed at [facility], [address], [place], [place] [address]      Lab Results  Component Value Date   ALBUMIN 3.7 05/28/2019   ALBUMIN 2.8 (L) 12/02/2018   ALBUMIN 3.0 (L) 12/01/2018    Lab Results  Component Value Date   MG 2.0 06/17/2018   MG 1.9 06/14/2018   MG 1.8 06/13/2018   No results found for: VD25OH  No results found for: PREALBUMIN CBC EXTENDED Latest Ref Rng & Units 05/28/2019 02/10/2019 02/09/2019  WBC 4.0 - 10.5 K/uL 13.5(H) 13.9(H) -  RBC 4.22 - 5.81 MIL/uL 7.77(H) 6.00(H) -  HGB 13.0 - 17.0 g/dL 16.8 13.2 16.7  HCT 39.0 - 52.0 % 53.0(H) 40.1 49.0  PLT 150 - 400 K/uL 324 249 -  NEUTROABS 1.7 - 7.7 K/uL 6.5 - -  LYMPHSABS 0.7 - 4.0 K/uL 5.2(H) - -     Body mass index is 25.46 kg/m.  Orders:  No orders of the defined types were placed in this encounter.  No orders of the defined types were placed in this encounter.    Procedures: No procedures performed  Clinical Data: No additional findings.  ROS:  All other systems negative, except as noted in the HPI. Review of Systems  Objective: Vital Signs: Ht 5\' 5"  (1.651 m)   Wt 153 lb (69.4 kg)   BMI 25.46  kg/m   Specialty Comments:  No specialty comments available.  PMFS History: Patient Active Problem List   Diagnosis Date Noted  . Malnutrition of moderate degree 06/18/2018  . Severe sepsis ([place]) 06/12/2018  . Leukocytosis 06/12/2018  . MRSA (methicillin resistant staph aureus) culture positive 06/12/2018  . Essential hypertension 06/12/2018  . ESRD (end stage renal disease) ([place]) 06/09/2018  . Gangrene of finger ([place]) 06/04/2018  . S/P transmetatarsal amputation of foot, right ([place]) 05/09/2017  . PVD (peripheral vascular disease) ([place]) 04/15/2017  . Nonischemic cardiomyopathy ([place]) 08/15/2015  . Aortic stenosis 08/15/2015  . Moderate aortic stenosis 07/12/2015  . 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 CAD- 40-50% LAD at cath 07/11/15 07/11/2015  . Aspirin  intolerance 07/11/2015  . Abnormal stress test   . ESRD on dialysis ([place]) 05/30/2015  . CTS (carpal tunnel syndrome) 02/28/2015  . PVD of LE - Dr Oneida Alar follows 08/20/2012  . Midfoot skin ulcer, right, limited to breakdown of skin ([place]) 01/16/2012  . Encounter for screening colonoscopy 10/08/2011  . Melena 10/08/2011  . Other complications due to renal dialysis device, implant, and graft 09/24/2011   Past Medical History:  Diagnosis Date  . Anxiety   . Aortic stenosis   . Arthritis    left hand  . CVA (cerebral infarction)   . ESRD (end stage renal disease) on dialysis [facility])    M/W/F at [facility] in [place]  . Essential hypertension    resolved with HD  . Gangrene of right foot ([place])   . Gastric ulcer 2004  . GI bleed    gastric ulcer  . Heart murmur   . History of blood transfusion   . History of cardiomyopathy    LVEF normal as of February 2017  . History of gastric ulcer   . History of stroke    Left side weakness  . Iron deficiency anemia   . Peripheral vascular disease ([place])   . Stroke [facility]) 2000   limp left    Family History  Problem Relation Age of Onset  . Hypertension Mother   . Colon cancer Neg Hx   . Liver disease Neg Hx     Past Surgical History:  Procedure Laterality Date  . ABDOMINAL AORTAGRAM N/A 01/24/2012   Procedure: ABDOMINAL Maxcine Ham;  Surgeon: Elam Dutch, MD;  Location: [facility] CATH LAB;  Service: Cardiovascular;  Laterality: N/A;  . ABDOMINAL AORTOGRAM W/LOWER EXTREMITY N/A 04/15/2017   Procedure: ABDOMINAL AORTOGRAM W/LOWER EXTREMITY;  Surgeon: Angelia Mould, MD;  Location: [place] CV LAB;  Service: Cardiovascular;  Laterality: N/A;  . AMPUTATION Right 05/09/2017   Procedure: RIGHT TRANSMETATARSAL AMPUTATION;  Surgeon: [name], MD;  Location: [place];  Service: Orthopedics;  Laterality: Right;  . AMPUTATION Right 06/13/2018   Procedure: AMPUTATION RIGHT LONG FINGER;  Surgeon: Dayna Barker, MD;  Location: [place];  Service:  Plastics;  Laterality: Right;  . ARTERIOVENOUS GRAFT PLACEMENT Right right arm  . AV FISTULA PLACEMENT Left 08/31/2015   Procedure: ARTERIOVENOUS (AV) FISTULA CREATION- LEFT RADIOCEPHALIC;  Surgeon: Mal Misty, MD;  Location: [place];  Service: Vascular;  Laterality: Left;  . AV FISTULA PLACEMENT Right 02/18/2017   Procedure: INSERTION OF ARTERIOVENOUS (AV) GORE-TEX GRAFT  RIGHT UPPER ARM;  Surgeon: Conrad Winchester, MD;  Location: [place];  Service: Vascular;  Laterality: Right;  . AV FISTULA PLACEMENT Left 12/01/2018   Procedure: INSERTION OF ARTERIOVENOUS (AV) GORE-TEX GRAFT ARM;  Surgeon: Angelia Mould, MD;  Location: [facility]  OR;  Service: Vascular;  Laterality: Left;  . AV FISTULA PLACEMENT Left 02/09/2019   Procedure: INSERTION OF ARTERIOVENOUS (AV) GORE-TEX GRAFT LEFT THIGH;  Surgeon: Waynetta Sandy, MD;  Location: [place];  Service: Vascular;  Laterality: Left;  . BASCILIC VEIN TRANSPOSITION Left 12/19/2015   Procedure: FIRST STAGE BRACHIAL VEIN TRANSPOSITION;  Surgeon: Conrad Hopewell, MD;  Location: [place];  Service: Vascular;  Laterality: Left;  . BASCILIC VEIN TRANSPOSITION Left 02/08/2016   Procedure: SECOND STAGE BRACHIAL VEIN TRANSPOSITION;  Surgeon: Conrad [place], MD;  Location: [place];  Service: Vascular;  Laterality: Left;  . CARDIAC CATHETERIZATION N/A 07/11/2015   Procedure: Left Heart Cath and Coronary Angiography;  Surgeon: Troy Sine, MD;  Location: [place] CV LAB;  Service: Cardiovascular;  Laterality: N/A;  . Carpel Tunnel Left Dec. 22, 2016  . CHOLECYSTECTOMY    . COLONOSCOPY  2004   Dr. Irving Shows, left sided diverticula and cecal polyp, path unknown  . COLONOSCOPY  10/29/2011   Procedure: COLONOSCOPY;  Surgeon: Daneil Dolin, MD;  [facility];  Service: Endoscopy;  Laterality: N/A;  10:15  . ESOPHAGOGASTRODUODENOSCOPY  11/2002   Dr. Gala Romney, erosive reflux esophagitis, multiple gastric ulcer and antral/bulbar erosions. Serologies positive for H.Pylori  and was treated  . ESOPHAGOGASTRODUODENOSCOPY  11/20014   Dr. Gala Romney, small hh only, ulcers healed  . ESOPHAGOGASTRODUODENOSCOPY  09/21/2011   Dr Trevor Iha [facility], antral erosions, ?early GAVE  . FISTULOGRAM Left 12/10/2016   Procedure: THROMBECTOMY OF LEFT ARM ARTERIOVENOUS FISTULA;  Surgeon: Waynetta Sandy, MD;  Location: [place];  Service: Vascular;  Laterality: Left;  . INSERTION OF DIALYSIS CATHETER Left 12/10/2016   Procedure: INSERTION OF TUNNELED DIALYSIS CATHETER;  Surgeon: Waynetta Sandy, MD;  Location: [place];  Service: Vascular;  Laterality: Left;  . INSERTION OF DIALYSIS CATHETER Right 06/09/2018   Procedure: INSERTION OF DIALYSIS CATHETER Right subclavian;  Surgeon: Angelia Mould, MD;  Location: [place];  Service: Vascular;  Laterality: Right;  . IR DIALY SHUNT INTRO [place] W/IMG RIGHT Right 01/01/2018  . IR GENERIC HISTORICAL  07/16/2016   IR REMOVAL TUN CV CATH W/O FL 07/16/2016 Saverio Danker, PA-C [facility]-INTERV RAD  . IR PTA ADDL CENTRAL DIALYSIS SEG THRU DIALY CIRCUIT RIGHT Right 10/21/2017  . IR REMOVAL TUN CV CATH W/O FL  05/12/2017  . IR THROMBECTOMY AV FISTULA W/THROMBOLYSIS/PTA INC/SHUNT/IMG RIGHT Right 10/21/2017  . IR THROMBECTOMY AV FISTULA W/THROMBOLYSIS/PTA INC/SHUNT/IMG RIGHT Right 11/27/2017  . IR US GUIDE VASC ACCESS RIGHT  10/21/2017  . IR US GUIDE VASC ACCESS RIGHT  11/27/2017  . IR US GUIDE VASC ACCESS RIGHT  01/01/2018  . LIGATION ARTERIOVENOUS GORTEX GRAFT Right 06/09/2018  . LIGATION ARTERIOVENOUS GORTEX GRAFT Right 06/09/2018   Procedure: LIGATION ARTERIOVENOUS GORTEX GRAFT RIGHT ARM;  Surgeon: Angelia Mould, MD;  Location: [place];  Service: Vascular;  Laterality: Right;  . LIGATION ARTERIOVENOUS GORTEX GRAFT Left 12/31/2018   Procedure: LIGATION OF LEFT UPPER ARM ARTERIVENOUS GORTEX GRAFT, LEFT BRACHIAL ARTERY ENDARTERECTOMY WITH BOVINE PATCH ANGIOPLASTY;  Surgeon: Waynetta Sandy, MD;  Location: [place];  Service:  Vascular;  Laterality: Left;  . LIGATION OF ARTERIOVENOUS  FISTULA Left 12/19/2015   Procedure: LIGATION OF RADIOCEPHALIC ARTERIOVENOUS  FISTULA;  Surgeon: Conrad [place], MD;  Location: [place];  Service: Vascular;  Laterality: Left;  Marland Kitchen MASS EXCISION Right 02/18/2017   Procedure: EXCISION OF RIGHT AXILLARY EPIDERMAL INCLUSION CYST;  Surgeon: Conrad Karnes, MD;  Location: [place];  Service: Vascular;  Laterality: Right;  . PERIPHERAL VASCULAR CATHETERIZATION N/A 12/14/2015   Procedure: Fistulagram;  Surgeon: Conrad Cyril, MD;  Location: [place] CV LAB;  Service: Cardiovascular;  Laterality: N/A;  . SHOULDER SURGERY Right    fracture  . ULTRASOUND GUIDANCE FOR VASCULAR ACCESS  04/15/2017   Procedure: Ultrasound Guidance For Vascular Access;  Surgeon: Angelia Mould, MD;  Location: [place] CV LAB;  Service: Cardiovascular;;  . UPPER EXTREMITY VENOGRAPHY Bilateral 12/17/2016   Procedure: Bilateral Upper Extremity Venography;  Surgeon: Serafina Mitchell, MD;  Location: [place] CV LAB;  Service: Cardiovascular;  Laterality: Bilateral;  . UPPER EXTREMITY VENOGRAPHY Left 11/03/2018   Procedure: UPPER EXTREMITY VENOGRAPHY;  Surgeon: Serafina Mitchell, MD;  Location: [place] CV LAB;  Service: Cardiovascular;  Laterality: Left;   Social History   Occupational History  . Occupation: retired, Licensed conveyancer    Employer: RETIRED  Tobacco Use  . Smoking status: Former Smoker    Years: 25.00    Quit date: 04/29/2004    Years since quitting: 15.1  . Smokeless tobacco: Former Systems developer    Types: Chew    Quit date: 01/16/1987  . Tobacco comment: quit 2006  Substance and Sexual Activity  . Alcohol use: No  . Drug use: No  . Sexual activity: Not on file

## 2019-06-23 DIAGNOSIS — N2581 Secondary hyperparathyroidism of renal origin: Secondary | ICD-10-CM | POA: Diagnosis not present

## 2019-06-23 DIAGNOSIS — D509 Iron deficiency anemia, unspecified: Secondary | ICD-10-CM | POA: Diagnosis not present

## 2019-06-23 DIAGNOSIS — N186 End stage renal disease: Secondary | ICD-10-CM | POA: Diagnosis not present

## 2019-06-23 DIAGNOSIS — Z992 Dependence on renal dialysis: Secondary | ICD-10-CM | POA: Diagnosis not present

## 2019-06-25 DIAGNOSIS — N186 End stage renal disease: Secondary | ICD-10-CM | POA: Diagnosis not present

## 2019-06-25 DIAGNOSIS — Z992 Dependence on renal dialysis: Secondary | ICD-10-CM | POA: Diagnosis not present

## 2019-06-25 DIAGNOSIS — D509 Iron deficiency anemia, unspecified: Secondary | ICD-10-CM | POA: Diagnosis not present

## 2019-06-25 DIAGNOSIS — N2581 Secondary hyperparathyroidism of renal origin: Secondary | ICD-10-CM | POA: Diagnosis not present

## 2019-06-27 DIAGNOSIS — N186 End stage renal disease: Secondary | ICD-10-CM | POA: Diagnosis not present

## 2019-06-27 DIAGNOSIS — Z992 Dependence on renal dialysis: Secondary | ICD-10-CM | POA: Diagnosis not present

## 2019-06-28 DIAGNOSIS — N2581 Secondary hyperparathyroidism of renal origin: Secondary | ICD-10-CM | POA: Diagnosis not present

## 2019-06-28 DIAGNOSIS — D509 Iron deficiency anemia, unspecified: Secondary | ICD-10-CM | POA: Diagnosis not present

## 2019-06-28 DIAGNOSIS — Z992 Dependence on renal dialysis: Secondary | ICD-10-CM | POA: Diagnosis not present

## 2019-06-28 DIAGNOSIS — N186 End stage renal disease: Secondary | ICD-10-CM | POA: Diagnosis not present

## 2019-06-29 DIAGNOSIS — N186 End stage renal disease: Secondary | ICD-10-CM | POA: Diagnosis not present

## 2019-06-29 DIAGNOSIS — I739 Peripheral vascular disease, unspecified: Secondary | ICD-10-CM | POA: Diagnosis not present

## 2019-06-29 DIAGNOSIS — S91104A Unspecified open wound of right lesser toe(s) without damage to nail, initial encounter: Secondary | ICD-10-CM | POA: Diagnosis not present

## 2019-06-30 ENCOUNTER — Other Ambulatory Visit: Payer: Self-pay

## 2019-06-30 ENCOUNTER — Inpatient Hospital Stay (HOSPITAL_COMMUNITY)
Admission: EM | Admit: 2019-06-30 | Discharge: 2019-07-04 | DRG: 535 | Disposition: A | Discharge status: Skilled Nursing Facility | Payer: Medicare Other | Attending: Internal Medicine | Admitting: Internal Medicine

## 2019-06-30 ENCOUNTER — Encounter (HOSPITAL_COMMUNITY): Payer: Self-pay | Admitting: Emergency Medicine

## 2019-06-30 ENCOUNTER — Emergency Department (HOSPITAL_COMMUNITY): Payer: Medicare Other

## 2019-06-30 DIAGNOSIS — Z899 Acquired absence of limb, unspecified: Secondary | ICD-10-CM

## 2019-06-30 DIAGNOSIS — S72144A Nondisplaced intertrochanteric fracture of right femur, initial encounter for closed fracture: Principal | ICD-10-CM | POA: Diagnosis present

## 2019-06-30 DIAGNOSIS — S7291XA Unspecified fracture of right femur, initial encounter for closed fracture: Secondary | ICD-10-CM | POA: Diagnosis not present

## 2019-06-30 DIAGNOSIS — I96 Gangrene, not elsewhere classified: Secondary | ICD-10-CM | POA: Diagnosis present

## 2019-06-30 DIAGNOSIS — M2550 Pain in unspecified joint: Secondary | ICD-10-CM | POA: Diagnosis not present

## 2019-06-30 DIAGNOSIS — R5383 Other fatigue: Secondary | ICD-10-CM | POA: Diagnosis not present

## 2019-06-30 DIAGNOSIS — M25551 Pain in right hip: Secondary | ICD-10-CM | POA: Diagnosis not present

## 2019-06-30 DIAGNOSIS — Z7401 Bed confinement status: Secondary | ICD-10-CM | POA: Diagnosis not present

## 2019-06-30 DIAGNOSIS — Z8711 Personal history of peptic ulcer disease: Secondary | ICD-10-CM | POA: Diagnosis not present

## 2019-06-30 DIAGNOSIS — Z79899 Other long term (current) drug therapy: Secondary | ICD-10-CM | POA: Diagnosis not present

## 2019-06-30 DIAGNOSIS — M19042 Primary osteoarthritis, left hand: Secondary | ICD-10-CM | POA: Diagnosis present

## 2019-06-30 DIAGNOSIS — S72114A Nondisplaced fracture of greater trochanter of right femur, initial encounter for closed fracture: Secondary | ICD-10-CM | POA: Diagnosis not present

## 2019-06-30 DIAGNOSIS — E785 Hyperlipidemia, unspecified: Secondary | ICD-10-CM | POA: Diagnosis present

## 2019-06-30 DIAGNOSIS — I10 Essential (primary) hypertension: Secondary | ICD-10-CM | POA: Diagnosis present

## 2019-06-30 DIAGNOSIS — D631 Anemia in chronic kidney disease: Secondary | ICD-10-CM | POA: Diagnosis present

## 2019-06-30 DIAGNOSIS — S72001A Fracture of unspecified part of neck of right femur, initial encounter for closed fracture: Secondary | ICD-10-CM

## 2019-06-30 DIAGNOSIS — I350 Nonrheumatic aortic (valve) stenosis: Secondary | ICD-10-CM | POA: Diagnosis present

## 2019-06-30 DIAGNOSIS — Y92009 Unspecified place in unspecified non-institutional (private) residence as the place of occurrence of the external cause: Secondary | ICD-10-CM | POA: Diagnosis not present

## 2019-06-30 DIAGNOSIS — I739 Peripheral vascular disease, unspecified: Secondary | ICD-10-CM | POA: Diagnosis present

## 2019-06-30 DIAGNOSIS — Z992 Dependence on renal dialysis: Secondary | ICD-10-CM

## 2019-06-30 DIAGNOSIS — I352 Nonrheumatic aortic (valve) stenosis with insufficiency: Secondary | ICD-10-CM | POA: Diagnosis present

## 2019-06-30 DIAGNOSIS — I2510 Atherosclerotic heart disease of native coronary artery without angina pectoris: Secondary | ICD-10-CM | POA: Diagnosis present

## 2019-06-30 DIAGNOSIS — I429 Cardiomyopathy, unspecified: Secondary | ICD-10-CM | POA: Diagnosis present

## 2019-06-30 DIAGNOSIS — W19XXXA Unspecified fall, initial encounter: Secondary | ICD-10-CM

## 2019-06-30 DIAGNOSIS — Z87891 Personal history of nicotine dependence: Secondary | ICD-10-CM

## 2019-06-30 DIAGNOSIS — R52 Pain, unspecified: Secondary | ICD-10-CM | POA: Diagnosis not present

## 2019-06-30 DIAGNOSIS — Z20822 Contact with and (suspected) exposure to covid-19: Secondary | ICD-10-CM | POA: Diagnosis present

## 2019-06-30 DIAGNOSIS — Z6826 Body mass index (BMI) 26.0-26.9, adult: Secondary | ICD-10-CM | POA: Diagnosis not present

## 2019-06-30 DIAGNOSIS — N186 End stage renal disease: Secondary | ICD-10-CM | POA: Diagnosis present

## 2019-06-30 DIAGNOSIS — I132 Hypertensive heart and chronic kidney disease with heart failure and with stage 5 chronic kidney disease, or end stage renal disease: Secondary | ICD-10-CM | POA: Diagnosis not present

## 2019-06-30 DIAGNOSIS — S72009A Fracture of unspecified part of neck of unspecified femur, initial encounter for closed fracture: Secondary | ICD-10-CM | POA: Diagnosis not present

## 2019-06-30 DIAGNOSIS — Z9049 Acquired absence of other specified parts of digestive tract: Secondary | ICD-10-CM

## 2019-06-30 DIAGNOSIS — Z886 Allergy status to analgesic agent status: Secondary | ICD-10-CM | POA: Diagnosis not present

## 2019-06-30 DIAGNOSIS — I499 Cardiac arrhythmia, unspecified: Secondary | ICD-10-CM | POA: Diagnosis not present

## 2019-06-30 DIAGNOSIS — Z89021 Acquired absence of right finger(s): Secondary | ICD-10-CM

## 2019-06-30 DIAGNOSIS — S72141A Displaced intertrochanteric fracture of right femur, initial encounter for closed fracture: Secondary | ICD-10-CM | POA: Diagnosis present

## 2019-06-30 DIAGNOSIS — I120 Hypertensive chronic kidney disease with stage 5 chronic kidney disease or end stage renal disease: Secondary | ICD-10-CM | POA: Diagnosis present

## 2019-06-30 DIAGNOSIS — S299XXA Unspecified injury of thorax, initial encounter: Secondary | ICD-10-CM | POA: Diagnosis not present

## 2019-06-30 DIAGNOSIS — S72144D Nondisplaced intertrochanteric fracture of right femur, subsequent encounter for closed fracture with routine healing: Secondary | ICD-10-CM | POA: Diagnosis not present

## 2019-06-30 DIAGNOSIS — I639 Cerebral infarction, unspecified: Secondary | ICD-10-CM | POA: Diagnosis not present

## 2019-06-30 DIAGNOSIS — Z8673 Personal history of transient ischemic attack (TIA), and cerebral infarction without residual deficits: Secondary | ICD-10-CM | POA: Diagnosis not present

## 2019-06-30 DIAGNOSIS — Z4781 Encounter for orthopedic aftercare following surgical amputation: Secondary | ICD-10-CM | POA: Diagnosis not present

## 2019-06-30 DIAGNOSIS — W1830XA Fall on same level, unspecified, initial encounter: Secondary | ICD-10-CM | POA: Diagnosis present

## 2019-06-30 DIAGNOSIS — I428 Other cardiomyopathies: Secondary | ICD-10-CM | POA: Diagnosis present

## 2019-06-30 DIAGNOSIS — M6281 Muscle weakness (generalized): Secondary | ICD-10-CM | POA: Diagnosis present

## 2019-06-30 DIAGNOSIS — L97529 Non-pressure chronic ulcer of other part of left foot with unspecified severity: Secondary | ICD-10-CM | POA: Diagnosis present

## 2019-06-30 DIAGNOSIS — E43 Unspecified severe protein-calorie malnutrition: Secondary | ICD-10-CM

## 2019-06-30 DIAGNOSIS — R2689 Other abnormalities of gait and mobility: Secondary | ICD-10-CM | POA: Diagnosis present

## 2019-06-30 DIAGNOSIS — I469 Cardiac arrest, cause unspecified: Secondary | ICD-10-CM | POA: Diagnosis not present

## 2019-06-30 DIAGNOSIS — Z8249 Family history of ischemic heart disease and other diseases of the circulatory system: Secondary | ICD-10-CM | POA: Diagnosis not present

## 2019-06-30 DIAGNOSIS — F419 Anxiety disorder, unspecified: Secondary | ICD-10-CM | POA: Diagnosis present

## 2019-06-30 DIAGNOSIS — I6930 Unspecified sequelae of cerebral infarction: Secondary | ICD-10-CM | POA: Diagnosis not present

## 2019-06-30 DIAGNOSIS — S99922A Unspecified injury of left foot, initial encounter: Secondary | ICD-10-CM | POA: Diagnosis not present

## 2019-06-30 DIAGNOSIS — M25572 Pain in left ankle and joints of left foot: Secondary | ICD-10-CM | POA: Diagnosis not present

## 2019-06-30 DIAGNOSIS — R1311 Dysphagia, oral phase: Secondary | ICD-10-CM | POA: Diagnosis present

## 2019-06-30 HISTORY — DX: Fracture of unspecified part of neck of right femur, initial encounter for closed fracture: S72.001A

## 2019-06-30 LAB — CBC WITH DIFFERENTIAL/PLATELET
Abs Immature Granulocytes: 0.05 10*3/uL (ref 0.00–0.07)
Basophils Absolute: 0.1 10*3/uL (ref 0.0–0.1)
Basophils Relative: 1 %
Eosinophils Absolute: 0.1 10*3/uL (ref 0.0–0.5)
Eosinophils Relative: 1 %
HCT: 46.7 % (ref 39.0–52.0)
Hemoglobin: 15.1 g/dL (ref 13.0–17.0)
Immature Granulocytes: 0 %
Lymphocytes Relative: 9 %
Lymphs Abs: 1.2 10*3/uL (ref 0.7–4.0)
MCH: 21.5 pg — ABNORMAL LOW (ref 26.0–34.0)
MCHC: 32.3 g/dL (ref 30.0–36.0)
MCV: 66.6 fL — ABNORMAL LOW (ref 80.0–100.0)
Monocytes Absolute: 0.7 10*3/uL (ref 0.1–1.0)
Monocytes Relative: 6 %
Neutro Abs: 10.6 10*3/uL — ABNORMAL HIGH (ref 1.7–7.7)
Neutrophils Relative %: 83 %
Platelets: 253 10*3/uL (ref 150–400)
RBC: 7.01 MIL/uL — ABNORMAL HIGH (ref 4.22–5.81)
RDW: 19.2 % — ABNORMAL HIGH (ref 11.5–15.5)
WBC: 12.7 10*3/uL — ABNORMAL HIGH (ref 4.0–10.5)
nRBC: 0.2 % (ref 0.0–0.2)

## 2019-06-30 LAB — BASIC METABOLIC PANEL
Anion gap: 16 — ABNORMAL HIGH (ref 5–15)
BUN: 36 mg/dL — ABNORMAL HIGH (ref 8–23)
CO2: 23 mmol/L (ref 22–32)
Calcium: 8.8 mg/dL — ABNORMAL LOW (ref 8.9–10.3)
Chloride: 99 mmol/L (ref 98–111)
Creatinine, Ser: 10.17 mg/dL — ABNORMAL HIGH (ref 0.61–1.24)
GFR calc Af Amer: 5 mL/min — ABNORMAL LOW (ref >60)
GFR calc non Af Amer: 4 mL/min — ABNORMAL LOW (ref >60)
Glucose, Bld: 111 mg/dL — ABNORMAL HIGH (ref 70–99)
Potassium: 4.3 mmol/L (ref 3.5–5.1)
Sodium: 138 mmol/L (ref 135–145)

## 2019-06-30 LAB — RESPIRATORY PANEL BY RT PCR (FLU A&B, COVID)
Influenza A by PCR: NEGATIVE
Influenza B by PCR: NEGATIVE
SARS Coronavirus 2 by RT PCR: NEGATIVE

## 2019-06-30 MED ORDER — METHOCARBAMOL 500 MG PO TABS: 500.0000 mg | ORAL_TABLET | Freq: Four times a day (QID) | ORAL | Status: DC | PRN

## 2019-06-30 MED ORDER — MORPHINE SULFATE (PF) 2 MG/ML IV SOLN: 0.5000 mg | INTRAVENOUS | Status: DC | PRN

## 2019-06-30 MED ORDER — CINACALCET HCL 30 MG PO TABS
60.0000 mg | ORAL_TABLET | Freq: Every evening | ORAL | Status: DC
  Administered 2019-06-30 – 2019-07-03 (×3): 60 mg via ORAL
  Filled 2019-06-30 (×5): qty 2

## 2019-06-30 MED ORDER — HEPARIN SODIUM (PORCINE) 5000 UNIT/ML IJ SOLN
5000.0000 [IU] | Freq: Three times a day (TID) | INTRAMUSCULAR | Status: DC
  Administered 2019-06-30 – 2019-07-04 (×12): 5000 [IU] via SUBCUTANEOUS
  Filled 2019-06-30 (×12): qty 1

## 2019-06-30 MED ORDER — VITAMIN D 25 MCG (1000 UNIT) PO TABS
1000.0000 [IU] | ORAL_TABLET | Freq: Every day | ORAL | Status: DC
  Administered 2019-07-01 – 2019-07-04 (×4): 1000 [IU] via ORAL
  Filled 2019-06-30 (×6): qty 1

## 2019-06-30 MED ORDER — HYDROCODONE-ACETAMINOPHEN 5-325 MG PO TABS
1.0000 | ORAL_TABLET | Freq: Four times a day (QID) | ORAL | Status: DC | PRN
  Administered 2019-06-30 – 2019-07-01 (×2): 1 via ORAL
  Administered 2019-07-01 – 2019-07-02 (×3): 2 via ORAL
  Administered 2019-07-03: 22:00:00 1 via ORAL
  Filled 2019-06-30: qty 2
  Filled 2019-06-30: qty 1
  Filled 2019-06-30: qty 2
  Filled 2019-06-30: qty 1
  Filled 2019-06-30: qty 2
  Filled 2019-06-30: qty 1

## 2019-06-30 MED ORDER — DOCUSATE SODIUM 100 MG PO CAPS
100.0000 mg | ORAL_CAPSULE | Freq: Two times a day (BID) | ORAL | Status: DC
  Administered 2019-06-30 – 2019-07-03 (×6): 100 mg via ORAL
  Filled 2019-06-30 (×9): qty 1

## 2019-06-30 MED ORDER — METHOCARBAMOL 1000 MG/10ML IJ SOLN
500.0000 mg | Freq: Four times a day (QID) | INTRAVENOUS | Status: DC | PRN
  Filled 2019-06-30: qty 5

## 2019-06-30 MED ORDER — ATORVASTATIN CALCIUM 40 MG PO TABS
40.0000 mg | ORAL_TABLET | Freq: Every day | ORAL | Status: DC
  Administered 2019-06-30 – 2019-07-04 (×5): 40 mg via ORAL
  Filled 2019-06-30 (×5): qty 1

## 2019-06-30 MED ORDER — ASCORBIC ACID 500 MG PO TABS
500.0000 mg | ORAL_TABLET | Freq: Every day | ORAL | Status: DC
  Administered 2019-06-30 – 2019-07-04 (×5): 500 mg via ORAL
  Filled 2019-06-30 (×5): qty 1

## 2019-06-30 MED ORDER — SODIUM CHLORIDE 0.9 % IV BOLUS
500.0000 mL | Freq: Once | INTRAVENOUS | Status: AC
  Administered 2019-06-30: 500 mL via INTRAVENOUS

## 2019-06-30 MED ORDER — BISACODYL 10 MG RE SUPP: 10.0000 mg | Freq: Every day | RECTAL | Status: DC | PRN

## 2019-06-30 MED ORDER — POLYETHYLENE GLYCOL 3350 17 G PO PACK: 17.0000 g | PACK | Freq: Every day | ORAL | Status: DC | PRN

## 2019-06-30 MED ORDER — RENA-VITE PO TABS
1.0000 | ORAL_TABLET | Freq: Every day | ORAL | Status: DC
  Administered 2019-06-30 – 2019-07-03 (×5): 1 via ORAL
  Filled 2019-06-30 (×5): qty 1

## 2019-06-30 MED ORDER — SEVELAMER CARBONATE 800 MG PO TABS
3200.0000 mg | ORAL_TABLET | Freq: Three times a day (TID) | ORAL | Status: DC
  Administered 2019-06-30 – 2019-07-04 (×9): 3200 mg via ORAL
  Filled 2019-06-30 (×14): qty 4

## 2019-06-30 MED ORDER — MIDODRINE HCL 5 MG PO TABS
10.0000 mg | ORAL_TABLET | ORAL | Status: DC
  Administered 2019-07-02: 10 mg via ORAL
  Filled 2019-06-30 (×2): qty 2

## 2019-06-30 MED ORDER — OXYCODONE-ACETAMINOPHEN 5-325 MG PO TABS
1.0000 | ORAL_TABLET | Freq: Once | ORAL | Status: AC
  Administered 2019-06-30: 1 via ORAL
  Filled 2019-06-30: qty 1

## 2019-06-30 NOTE — ED Notes (Signed)
Pt returned from xr

## 2019-06-30 NOTE — ED Notes (Signed)
Unable to get PIV. Will discuss with EDP to get [place] IV

## 2019-06-30 NOTE — H&P (Signed)
History and Physical    Patient Demographics:    [name] FMB:[id number] DOB: [birth year] DOA: 06/30/2019  PCP: [name], MD  Patient coming from: Home  I have personally briefly reviewed patient's old medical records in [place]  Chief Complaint: Fall with right hip pain   Assessment & Plan:     Assessment/Plan Principal Problem:   Closed right hip fracture ([place]) Active Problems:   ESRD on dialysis ([place])   Non-ischemic cardiomyopathy- EF 35- 45%   CAD- 40-50% LAD at cath 07/11/15   Moderate aortic stenosis   PVD (peripheral vascular disease) ([place])   Essential hypertension     Principal Problem: Right intertrochanteric and greater trochanteric femur fracture Patient had a mechanical fall and sustained injury to the right hip.  Found to have a right superior intertrochanteric and greater trochanteric fracture.  Case discussed with Dr. Lorin Mercy from orthopedic surgery service who recommended transfer to [facility] for evaluation by Dr. Sharol Given in the morning. -Bedrest -Pain meds as needed -Orthopedic surgery consult with Dr. Sharol Given in a.m.  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  End-stage renal disease on hemodialysis Patient is on HD MWF.  Most recent dialysis on 06/28/2019.  Next due 06/30/2019. -Continue cinacalcet, sevelamer -Continue midodrine -We will need nephrology consult for continued hemodialysis this morning on arrival to [facility].  Nonischemic cardiomyopathy Most recent echocardiogram in August 2020 showed EF of 55 to 60%, moderately increased LV wall thickness, mild diastolic dysfunction  Moderate aortic stenosis Noted moderate to severe aortic valve stenosis with moderate aortic regurgitation.  Mean gradient 38, aortic valve area 0.84  History of CVA No residual  weakness currently  Hyperlipidemia -Continue Lipitor   DVT prophylaxis: Heparin Code Status:  Full code Family Communication: N/A  Disposition Plan: Patient admitted to [facility] for further intervention.  Hospitalist to call Dr. Sharol Given when patient arrives Consults called: N/A Admission status: Inpatient status    HPI:     HPI: [name]. is a 79 y.o. 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  Patient states he has been having some loose stools overnight which is not unusual for him.  He woke up last night trying to get to the bathroom and had a mechanical fall and landed on his right hip.  He had significant pain on the right hip and has had difficulty with walking since then.  No head injury or loss of consciousness reported.  No back or neck pain.  Has a history of end-stage renal disease and is on hemodialysis.  Next dialysis due for this morning.  Does not make any urine at baseline. No chest pain, has chronic shortness of breath at baseline, no palpitations reported No nausea, vomiting, abdominal pain.  Has occasional diarrhea and has had some loose stools over the last couple of days. ED Course:  Vital Signs reviewed on presentation, significant for temperature 97.8, heart rate 102, blood pressure 132/75, saturation 98% on  room air. Labs reviewed, significant for sodium 138, potassium 4.3, BUN 36, creatinine 10.1, WBC count 12.7, hemoglobin 15.1, hematocrit 46, platelets 253. Imaging personally Reviewed, chest x-ray shows clear lungs,  prior fixation of the right shoulder, no acute cardiopulmonary disease.  Left foot x-ray shows erosive changes with the distal third and fifth phalanx.  X-ray of the right femur shows nondisplaced greater trochanteric and superior intertrochanteric fracture.    Review of systems:    Review of Systems: As per HPI otherwise 10 point review of systems negative.  All other review of systems is negative except the ones noted above in the HPI.    Past Medical and Surgical History:  Reviewed by me  Past Medical History:  Diagnosis Date  . Anxiety   . Aortic stenosis   . Arthritis    left hand  . CVA (cerebral infarction)   . ESRD (end stage renal disease) on dialysis [facility])    M/W/F at [facility] in [place]  . Essential hypertension    resolved with HD  . Gangrene of right foot ([place])   . Gastric ulcer 2004  . GI bleed    gastric ulcer  . Heart murmur   . History of blood transfusion   . History of cardiomyopathy    LVEF normal as of February 2017  . History of gastric ulcer   . History of stroke    Left side weakness  . Iron deficiency anemia   . Peripheral vascular disease ([place])   . Stroke [facility] 2000   limp left    Past Surgical History:  Procedure Laterality Date  . ABDOMINAL AORTAGRAM N/A 01/24/2012   Procedure: ABDOMINAL Maxcine Ham;  Surgeon: Elam Dutch, MD;  Location: [facility] CATH LAB;  Service: Cardiovascular;  Laterality: N/A;  . ABDOMINAL AORTOGRAM W/LOWER EXTREMITY N/A 04/15/2017   Procedure: ABDOMINAL AORTOGRAM W/LOWER EXTREMITY;  Surgeon: Angelia Mould, MD;  Location: [place] CV LAB;  Service: Cardiovascular;  Laterality: N/A;  . AMPUTATION Right 05/09/2017   Procedure: RIGHT TRANSMETATARSAL AMPUTATION;  Surgeon: [name], MD;  Location: [place];  Service: Orthopedics;  Laterality: Right;  . AMPUTATION Right 06/13/2018   Procedure: AMPUTATION RIGHT LONG FINGER;  Surgeon: Dayna Barker, MD;  Location: [place];  Service: Plastics;  Laterality: Right;  .  ARTERIOVENOUS GRAFT PLACEMENT Right right arm  . AV FISTULA PLACEMENT Left 08/31/2015   Procedure: ARTERIOVENOUS (AV) FISTULA CREATION- LEFT RADIOCEPHALIC;  Surgeon: Mal Misty, MD;  Location: [place];  Service: Vascular;  Laterality: Left;  . AV FISTULA PLACEMENT Right 02/18/2017   Procedure: INSERTION OF ARTERIOVENOUS (AV) GORE-TEX GRAFT  RIGHT UPPER ARM;  Surgeon: Conrad Montcalm, MD;  Location: [place];  Service: Vascular;  Laterality: Right;  . AV FISTULA PLACEMENT Left 12/01/2018   Procedure: INSERTION OF ARTERIOVENOUS (AV) GORE-TEX GRAFT ARM;  Surgeon: Angelia Mould, MD;  Location: [place];  Service: Vascular;  Laterality: Left;  . AV FISTULA PLACEMENT Left 02/09/2019   Procedure: INSERTION OF ARTERIOVENOUS (AV) GORE-TEX GRAFT LEFT THIGH;  Surgeon: Waynetta Sandy, MD;  Location: [place];  Service: Vascular;  Laterality: Left;  . BASCILIC VEIN TRANSPOSITION Left 12/19/2015   Procedure: FIRST STAGE BRACHIAL VEIN TRANSPOSITION;  Surgeon: Conrad Prospect, MD;  Location: [place];  Service: Vascular;  Laterality: Left;  . BASCILIC VEIN TRANSPOSITION Left 02/08/2016   Procedure: SECOND STAGE BRACHIAL VEIN TRANSPOSITION;  Surgeon: Conrad Burney, MD;  Location: [place];  Service: Vascular;  Laterality: Left;  . CARDIAC  CATHETERIZATION N/A 07/11/2015   Procedure: Left Heart Cath and Coronary Angiography;  Surgeon: Troy Sine, MD;  Location: [place] CV LAB;  Service: Cardiovascular;  Laterality: N/A;  . Carpel Tunnel Left Dec. 22, 2016  . CHOLECYSTECTOMY    . COLONOSCOPY  2004   Dr. Irving Shows, left sided diverticula and cecal polyp, path unknown  . COLONOSCOPY  10/29/2011   Procedure: COLONOSCOPY;  Surgeon: Daneil Dolin, MD;  [facility];  Service: Endoscopy;  Laterality: N/A;  10:15  . ESOPHAGOGASTRODUODENOSCOPY  11/2002   Dr. Gala Romney, erosive reflux esophagitis, multiple gastric ulcer and antral/bulbar erosions. Serologies positive for H.Pylori and was treated  .  ESOPHAGOGASTRODUODENOSCOPY  11/20014   Dr. Gala Romney, small hh only, ulcers healed  . ESOPHAGOGASTRODUODENOSCOPY  09/21/2011   Dr Trevor Iha [facility], antral erosions, ?early GAVE  . FISTULOGRAM Left 12/10/2016   Procedure: THROMBECTOMY OF LEFT ARM ARTERIOVENOUS FISTULA;  Surgeon: Waynetta Sandy, MD;  Location: [place];  Service: Vascular;  Laterality: Left;  . INSERTION OF DIALYSIS CATHETER Left 12/10/2016   Procedure: INSERTION OF TUNNELED DIALYSIS CATHETER;  Surgeon: Waynetta Sandy, MD;  Location: [place];  Service: Vascular;  Laterality: Left;  . INSERTION OF DIALYSIS CATHETER Right 06/09/2018   Procedure: INSERTION OF DIALYSIS CATHETER Right subclavian;  Surgeon: Angelia Mould, MD;  Location: [place];  Service: Vascular;  Laterality: Right;  . IR DIALY SHUNT INTRO [place] W/IMG RIGHT Right 01/01/2018  . IR GENERIC HISTORICAL  07/16/2016   IR REMOVAL TUN CV CATH W/O FL 07/16/2016 Saverio Danker, PA-C [facility]-INTERV RAD  . IR PTA ADDL CENTRAL DIALYSIS SEG THRU DIALY CIRCUIT RIGHT Right 10/21/2017  . IR REMOVAL TUN CV CATH W/O FL  05/12/2017  . IR THROMBECTOMY AV FISTULA W/THROMBOLYSIS/PTA INC/SHUNT/IMG RIGHT Right 10/21/2017  . IR THROMBECTOMY AV FISTULA W/THROMBOLYSIS/PTA INC/SHUNT/IMG RIGHT Right 11/27/2017  . IR US GUIDE VASC ACCESS RIGHT  10/21/2017  . IR US GUIDE VASC ACCESS RIGHT  11/27/2017  . IR US GUIDE VASC ACCESS RIGHT  01/01/2018  . LIGATION ARTERIOVENOUS GORTEX GRAFT Right 06/09/2018  . LIGATION ARTERIOVENOUS GORTEX GRAFT Right 06/09/2018   Procedure: LIGATION ARTERIOVENOUS GORTEX GRAFT RIGHT ARM;  Surgeon: Angelia Mould, MD;  Location: [place];  Service: Vascular;  Laterality: Right;  . LIGATION ARTERIOVENOUS GORTEX GRAFT Left 12/31/2018   Procedure: LIGATION OF LEFT UPPER ARM ARTERIVENOUS GORTEX GRAFT, LEFT BRACHIAL ARTERY ENDARTERECTOMY WITH BOVINE PATCH ANGIOPLASTY;  Surgeon: Waynetta Sandy, MD;  Location: [place];  Service: Vascular;  Laterality:  Left;  . LIGATION OF ARTERIOVENOUS  FISTULA Left 12/19/2015   Procedure: LIGATION OF RADIOCEPHALIC ARTERIOVENOUS  FISTULA;  Surgeon: Conrad [place], MD;  Location: [place];  Service: Vascular;  Laterality: Left;  Marland Kitchen MASS EXCISION Right 02/18/2017   Procedure: EXCISION OF RIGHT AXILLARY EPIDERMAL INCLUSION CYST;  Surgeon: Conrad [place], MD;  Location: [place];  Service: Vascular;  Laterality: Right;  . PERIPHERAL VASCULAR CATHETERIZATION N/A 12/14/2015   Procedure: Fistulagram;  Surgeon: Conrad [place], MD;  Location: [place] CV LAB;  Service: Cardiovascular;  Laterality: N/A;  . SHOULDER SURGERY Right    fracture  . ULTRASOUND GUIDANCE FOR VASCULAR ACCESS  04/15/2017   Procedure: Ultrasound Guidance For Vascular Access;  Surgeon: Angelia Mould, MD;  Location: [place] CV LAB;  Service: Cardiovascular;;  . UPPER EXTREMITY VENOGRAPHY Bilateral 12/17/2016   Procedure: Bilateral Upper Extremity Venography;  Surgeon: Serafina Mitchell, MD;  Location: [place] CV LAB;  Service: Cardiovascular;  Laterality: Bilateral;  . UPPER EXTREMITY VENOGRAPHY Left 11/03/2018   Procedure: UPPER EXTREMITY VENOGRAPHY;  Surgeon: Serafina Mitchell, MD;  Location: [name] CV LAB;  Service: Cardiovascular;  Laterality: Left;     Social History:  Reviewed by me   reports that he quit smoking about 15 years ago. He quit after 25.00 years of use. He quit smokeless tobacco use about 32 years ago.  His smokeless tobacco use included chew. He reports that he does not drink alcohol or use drugs.  Allergies:    Allergies  Allergen Reactions  . Aspirin Other (See Comments)    Causes internal bleeding  History of ulcers    Family History :   Family History  Problem Relation Age of Onset  . Hypertension Mother   . Colon cancer Neg Hx   . Liver disease Neg Hx    Family history reviewed, noted as above, not pertinent to current presentation.   Home Medications:    Prior to Admission medications     Medication Sig Start Date End Date Taking? Authorizing Provider  atorvastatin (LIPITOR) 40 MG tablet Take 40 mg by mouth daily. 11/03/15   [provider]  b complex-vitamin c-folic acid (NEPHRO-VITE) 0.8 MG TABS Take 1 tablet by mouth See admin instructions. Take one tablet daily on Tuesdays, Thursdays, Saturdays, and Sundays.    [provider]  cholecalciferol (VITAMIN D3) 25 MCG (1000 UT) tablet Take 1,000 Units by mouth daily.    [provider]  cinacalcet (SENSIPAR) 60 MG tablet Take 60 mg by mouth every evening.     [provider]  midodrine (PROAMATINE) 10 MG tablet Take 10 mg by mouth every Monday, Wednesday, and Friday. before dialysis. 09/30/18   [provider]  sevelamer (RENVELA) 800 MG tablet Take 1,600-3,200 mg by mouth See admin instructions. Take 3200  mg by mouth 3 times daily with full meals and take 1600 mg by mouth with snacks.    [provider]  vitamin C (ASCORBIC ACID) 500 MG tablet Take 500 mg by mouth daily.    [provider]    Physical Exam:    Physical Exam: Vitals:   06/30/19 0312 06/30/19 0318 06/30/19 0321  BP:  (!) 92/57   Pulse:  (!) 107 (!) 107  Resp:  19 16  Temp:  97.8 F (36.6 C)   TempSrc:  Oral   SpO2:  94% 99%  Weight: 72.6 kg    Height: 5\' 5"  (1.651 m)      Constitutional: NAD, calm, comfortable Vitals:   06/30/19 0312 06/30/19 0318 06/30/19 0321  BP:  (!) 92/57   Pulse:  (!) 107 (!) 107  Resp:  19 16  Temp:  97.8 F (36.6 C)   TempSrc:  Oral   SpO2:  94% 99%  Weight: 72.6 kg    Height: 5\' 5"  (1.651 m)     Eyes: PERRL, lids and conjunctivae normal ENMT: Mucous membranes are moist. Posterior pharynx clear of any exudate or lesions.Normal dentition.  Neck: normal, supple, no masses, no thyromegaly Respiratory: clear to auscultation bilaterally, no wheezing, no crackles. Normal respiratory effort. No accessory muscle use.  Cardiovascular: Regular rate and rhythm,  ejection systolic murmur 4/6 / rubs / gallops. No extremity edema. 2+ pedal pulses. No carotid bruits.  Abdomen: no tenderness, no masses palpated. No hepatosplenomegaly. Bowel sounds positive.  Musculoskeletal: Right anterior lateral hip tenderness without shortening or external rotation. Transmetatarsal amputation of right foot with healing wound gangrenous appearing  toes of left foot 3rd and 4th DP pulses palpable with Doppler bilaterally Left sided lower extremity AV fistula with good thrill Bilateral upper extremity AV fistulas which appear occluded Right third finger amputation Skin: chronic skin changes bilaterally  Neurologic: CN 2-12 grossly intact.  Decreased sensations bilaterally. Strength 5/5 in all 4.  Psychiatric: Normal judgment and insight. Alert and oriented x 3. Normal mood.    Decubitus Ulcers: Not present on admission Catheters and tubes: None  Data Review:    Labs on Admission: I have personally reviewed following labs and imaging studies  CBC: Recent Labs  Lab 06/30/19 0351  WBC 12.7*  NEUTROABS 10.6*  HGB 15.1  HCT 46.7  MCV 66.6*  PLT 053   Basic Metabolic Panel: Recent Labs  Lab 06/30/19 0351  NA 138  K 4.3  CL 99  CO2 23  GLUCOSE 111*  BUN 36*  CREATININE 10.17*  CALCIUM 8.8*   GFR: Estimated Creatinine Clearance: 5.4 mL/min (A) (by C-G formula based on SCr of 10.17 mg/dL (H)). Liver Function Tests: No results for input(s): AST, ALT, ALKPHOS, BILITOT, PROT, ALBUMIN in the last 168 hours. No results for input(s): LIPASE, AMYLASE in the last 168 hours. No results for input(s): AMMONIA in the last 168 hours. Coagulation Profile: No results for input(s): INR, PROTIME in the last 168 hours. Cardiac Enzymes: No results for input(s): CKTOTAL, CKMB, CKMBINDEX, TROPONINI in the last 168 hours. BNP (last 3 results) No results for input(s): PROBNP in the last 8760 hours. HbA1C: No results for input(s): HGBA1C in the last 72 hours. CBG: No  results for input(s): GLUCAP in the last 168 hours. Lipid Profile: No results for input(s): CHOL, HDL, LDLCALC, TRIG, CHOLHDL, LDLDIRECT in the last 72 hours. Thyroid Function Tests: No results for input(s): TSH, T4TOTAL, FREET4, T3FREE, THYROIDAB in the last 72 hours. Anemia Panel: No results for input(s): VITAMINB12, FOLATE, FERRITIN, TIBC, IRON, RETICCTPCT in the last 72 hours. Urine analysis: No results found for: COLORURINE, APPEARANCEUR, [place], [place], [place], [place], BILIRUBINUR, KETONESUR, PROTEINUR, UROBILINOGEN, NITRITE, LEUKOCYTESUR   Imaging Results:      Radiological Exams on Admission: DG Chest 1 View  Result Date: 06/30/2019 CLINICAL DATA:  Fall fall today EXAM: CHEST  1 VIEW COMPARISON:  August 02, 2018 FINDINGS: The heart size and mediastinal contours are within normal limits. Aortic knob calcifications. Both lungs are clear. No acute osseous findings. Prior fixation seen in the right shoulder. IMPRESSION: No active disease. Electronically Signed   By: Prudencio Pair M.D.   On: 06/30/2019 04:54   DG Foot Complete Left  Result Date: 06/30/2019 CLINICAL DATA:  Gangrene of the toes and pain after fall EXAM: LEFT FOOT - COMPLETE 3+ VIEW COMPARISON:  None. FINDINGS: No fracture or dislocation. There is diffuse osteopenia. Erosive type change seen at the distal third and fifth phalanx. Calcaneal enthesophytes are seen. Dense vascular calcifications are noted. IMPRESSION: No acute fracture. Erosive changes with the distal third and fifth phalanx. Electronically Signed   By: Prudencio Pair M.D.   On: 06/30/2019 04:55   DG Hip Unilat W or Wo Pelvis 2-3 Views Right  Result Date: 06/30/2019 CLINICAL DATA:  Fall and hip pain EXAM: DG HIP (WITH OR WITHOUT PELVIS) 2-3V RIGHT COMPARISON:  None. FINDINGS: There is a nondisplaced fracture seen through the greater trochanter with slight impaction and a probable nondisplaced intratrochanteric fracture. The femoral head is still well seated within  the acetabulum. There is diffuse osteopenia. Dense vascular calcifications are noted. IMPRESSION: Nondisplaced greater  trochanter and intertrochanteric fractures. Electronically Signed   By: Prudencio Pair M.D.   On: 06/30/2019 04:53   DG FEMUR, MIN 2 VIEWS RIGHT  Result Date: 06/30/2019 CLINICAL DATA:  Fall EXAM: RIGHT FEMUR 2 VIEWS COMPARISON:  None. FINDINGS: There is nondisplaced mildly impacted fracture seen through the greater trochanter and intertrochanteric regions. No other fractures are seen. There is diffuse osteopenia. Dense vascular calcifications are noted. IMPRESSION: Nondisplaced greater trochanter and superior intertrochanteric fracture. Electronically Signed   By: Prudencio Pair M.D.   On: 06/30/2019 04:53      Margurete Guaman Ginette Otto MD [facility]  If 7PM-7AM, please contact night-coverage   06/30/2019, 5:25 AM

## 2019-06-30 NOTE — ED Triage Notes (Signed)
Pt states he has been having diarrhea and got up to fast and fell. Pt c/o right hip pain. Pt last dialysis was Monday.

## 2019-06-30 NOTE — Consult Note (Signed)
[place] Admit Date: 06/30/2019 06/30/2019 Rexene Agent Requesting Physician:  Roderic Palau MD  Reason for Consult:  ESRD Comanagement HPI:  79 year old male admitted earlier this morning after presenting to the hospital following a fall.  He has ESRD on HD at [facility] on [address] MWF using a left thigh AV graft.  Work-up of his fall identified right sided nondisplaced greater trochanter and superior intertrochanteric fracture.  Case was discussed with orthopedics and plan is for transfer to [facility] for further management.  Patient's last dialysis was on 3/1.  Outpatient records are unavailable.  He tells me it was uneventful and has not had any issues with dialysis of late.  Labs today are stable with K of 4.3, HCO3 23, BUN 36.  Other past history includes history of CVA, PVD, PUD, right foot TMA, ulcers of the left toes.  ROS Balance of 12 systems is negative w/ exceptions as above  PMH  Past Medical History:  Diagnosis Date  . Anxiety   . Aortic stenosis   . Arthritis    left hand  . CVA (cerebral infarction)   . ESRD (end stage renal disease) on dialysis [facility])    M/W/F at [facility] in [place]  . Essential hypertension    resolved with HD  . Gangrene of right foot ([place])   . Gastric ulcer 2004  . GI bleed    gastric ulcer  . Heart murmur   . History of blood transfusion   . History of cardiomyopathy    LVEF normal as of February 2017  . History of gastric ulcer   . History of stroke    Left side weakness  . Iron deficiency anemia   . Peripheral vascular disease ([place])   . Stroke [facility]) 2000   limp left   PSH  Past Surgical History:  Procedure Laterality Date  . ABDOMINAL AORTAGRAM N/A 01/24/2012   Procedure: ABDOMINAL Maxcine Ham;  Surgeon: Elam Dutch, MD;  Location: [facility] CATH LAB;  Service: Cardiovascular;  Laterality: N/A;  . ABDOMINAL AORTOGRAM W/LOWER EXTREMITY N/A 04/15/2017   Procedure: ABDOMINAL AORTOGRAM W/LOWER EXTREMITY;  Surgeon: Angelia Mould, MD;  Location: [place] CV LAB;  Service: Cardiovascular;  Laterality: N/A;  . AMPUTATION Right 05/09/2017   Procedure: RIGHT TRANSMETATARSAL AMPUTATION;  Surgeon: [name], MD;  Location: [place];  Service: Orthopedics;  Laterality: Right;  . AMPUTATION Right 06/13/2018   Procedure: AMPUTATION RIGHT LONG FINGER;  Surgeon: Dayna Barker, MD;  Location: [place];  Service: Plastics;  Laterality: Right;  . ARTERIOVENOUS GRAFT PLACEMENT Right right arm  . AV FISTULA PLACEMENT Left 08/31/2015   Procedure: ARTERIOVENOUS (AV) FISTULA CREATION- LEFT RADIOCEPHALIC;  Surgeon: Mal Misty, MD;  Location: [place];  Service: Vascular;  Laterality: Left;  . AV FISTULA PLACEMENT Right 02/18/2017   Procedure: INSERTION OF ARTERIOVENOUS (AV) GORE-TEX GRAFT  RIGHT UPPER ARM;  Surgeon: Conrad [place], MD;  Location: [place];  Service: Vascular;  Laterality: Right;  . AV FISTULA PLACEMENT Left 12/01/2018   Procedure: INSERTION OF ARTERIOVENOUS (AV) GORE-TEX GRAFT ARM;  Surgeon: Angelia Mould, MD;  Location: [place];  Service: Vascular;  Laterality: Left;  . AV FISTULA PLACEMENT Left 02/09/2019   Procedure: INSERTION OF ARTERIOVENOUS (AV) GORE-TEX GRAFT LEFT THIGH;  Surgeon: Waynetta Sandy, MD;  Location: [place];  Service: Vascular;  Laterality: Left;  . BASCILIC VEIN TRANSPOSITION Left 12/19/2015   Procedure: FIRST STAGE BRACHIAL VEIN TRANSPOSITION;  Surgeon: Conrad [place], MD;  Location: [facility] OR;  Service: Vascular;  Laterality: Left;  . BASCILIC VEIN TRANSPOSITION Left 02/08/2016   Procedure: SECOND STAGE BRACHIAL VEIN TRANSPOSITION;  Surgeon: Conrad [place], MD;  Location: [place];  Service: Vascular;  Laterality: Left;  . CARDIAC CATHETERIZATION N/A 07/11/2015   Procedure: Left Heart Cath and Coronary Angiography;  Surgeon: Troy Sine, MD;  Location: [place] CV LAB;  Service: Cardiovascular;  Laterality: N/A;  . Carpel Tunnel Left Dec. 22, 2016  . CHOLECYSTECTOMY    . COLONOSCOPY   2004   Dr. Irving Shows, left sided diverticula and cecal polyp, path unknown  . COLONOSCOPY  10/29/2011   Procedure: COLONOSCOPY;  Surgeon: Daneil Dolin, MD;  [facility];  Service: Endoscopy;  Laterality: N/A;  10:15  . ESOPHAGOGASTRODUODENOSCOPY  11/2002   Dr. Gala Romney, erosive reflux esophagitis, multiple gastric ulcer and antral/bulbar erosions. Serologies positive for H.Pylori and was treated  . ESOPHAGOGASTRODUODENOSCOPY  11/20014   Dr. Gala Romney, small hh only, ulcers healed  . ESOPHAGOGASTRODUODENOSCOPY  09/21/2011   Dr Trevor Iha [facility], antral erosions, ?early GAVE  . FISTULOGRAM Left 12/10/2016   Procedure: THROMBECTOMY OF LEFT ARM ARTERIOVENOUS FISTULA;  Surgeon: Waynetta Sandy, MD;  Location: [place];  Service: Vascular;  Laterality: Left;  . INSERTION OF DIALYSIS CATHETER Left 12/10/2016   Procedure: INSERTION OF TUNNELED DIALYSIS CATHETER;  Surgeon: Waynetta Sandy, MD;  Location: [place];  Service: Vascular;  Laterality: Left;  . INSERTION OF DIALYSIS CATHETER Right 06/09/2018   Procedure: INSERTION OF DIALYSIS CATHETER Right subclavian;  Surgeon: Angelia Mould, MD;  Location: [place];  Service: Vascular;  Laterality: Right;  . IR DIALY SHUNT INTRO [place] W/IMG RIGHT Right 01/01/2018  . IR GENERIC HISTORICAL  07/16/2016   IR REMOVAL TUN CV CATH W/O FL 07/16/2016 Saverio Danker, PA-C [facility]-INTERV RAD  . IR PTA ADDL CENTRAL DIALYSIS SEG THRU DIALY CIRCUIT RIGHT Right 10/21/2017  . IR REMOVAL TUN CV CATH W/O FL  05/12/2017  . IR THROMBECTOMY AV FISTULA W/THROMBOLYSIS/PTA INC/SHUNT/IMG RIGHT Right 10/21/2017  . IR THROMBECTOMY AV FISTULA W/THROMBOLYSIS/PTA INC/SHUNT/IMG RIGHT Right 11/27/2017  . IR US GUIDE VASC ACCESS RIGHT  10/21/2017  . IR US GUIDE VASC ACCESS RIGHT  11/27/2017  . IR US GUIDE VASC ACCESS RIGHT  01/01/2018  . LIGATION ARTERIOVENOUS GORTEX GRAFT Right 06/09/2018  . LIGATION ARTERIOVENOUS GORTEX GRAFT Right 06/09/2018   Procedure: LIGATION  ARTERIOVENOUS GORTEX GRAFT RIGHT ARM;  Surgeon: Angelia Mould, MD;  Location: [place];  Service: Vascular;  Laterality: Right;  . LIGATION ARTERIOVENOUS GORTEX GRAFT Left 12/31/2018   Procedure: LIGATION OF LEFT UPPER ARM ARTERIVENOUS GORTEX GRAFT, LEFT BRACHIAL ARTERY ENDARTERECTOMY WITH BOVINE PATCH ANGIOPLASTY;  Surgeon: Waynetta Sandy, MD;  Location: [place];  Service: Vascular;  Laterality: Left;  . LIGATION OF ARTERIOVENOUS  FISTULA Left 12/19/2015   Procedure: LIGATION OF RADIOCEPHALIC ARTERIOVENOUS  FISTULA;  Surgeon: Conrad [place], MD;  Location: [place];  Service: Vascular;  Laterality: Left;  Marland Kitchen MASS EXCISION Right 02/18/2017   Procedure: EXCISION OF RIGHT AXILLARY EPIDERMAL INCLUSION CYST;  Surgeon: Conrad Dwight, MD;  Location: [place];  Service: Vascular;  Laterality: Right;  . PERIPHERAL VASCULAR CATHETERIZATION N/A 12/14/2015   Procedure: Fistulagram;  Surgeon: Conrad O'Brien, MD;  Location: [place] CV LAB;  Service: Cardiovascular;  Laterality: N/A;  . SHOULDER SURGERY Right    fracture  . ULTRASOUND GUIDANCE FOR VASCULAR ACCESS  04/15/2017   Procedure: Ultrasound Guidance For Vascular Access;  Surgeon:  Angelia Mould, MD;  Location: [place] CV LAB;  Service: Cardiovascular;;  . UPPER EXTREMITY VENOGRAPHY Bilateral 12/17/2016   Procedure: Bilateral Upper Extremity Venography;  Surgeon: Serafina Mitchell, MD;  Location: [place] CV LAB;  Service: Cardiovascular;  Laterality: Bilateral;  . UPPER EXTREMITY VENOGRAPHY Left 11/03/2018   Procedure: UPPER EXTREMITY VENOGRAPHY;  Surgeon: Serafina Mitchell, MD;  Location: [place] CV LAB;  Service: Cardiovascular;  Laterality: Left;   FH  Family History  Problem Relation Age of Onset  . Hypertension Mother   . Colon cancer Neg Hx   . Liver disease Neg Hx    SH  reports that he quit smoking about 15 years ago. He quit after 25.00 years of use. He quit smokeless tobacco use about 32 years ago.  His smokeless  tobacco use included chew. He reports that he does not drink alcohol or use drugs. Allergies  Allergies  Allergen Reactions  . Aspirin Other (See Comments)    Causes internal bleeding  History of ulcers   Home medications Prior to Admission medications   Medication Sig Start Date End Date Taking? Authorizing Provider  atorvastatin (LIPITOR) 40 MG tablet Take 40 mg by mouth daily. 11/03/15  Yes [provider]  b complex-vitamin c-folic acid (NEPHRO-VITE) 0.8 MG TABS Take 1 tablet by mouth See admin instructions. Take one tablet daily on Tuesdays, Thursdays, Saturdays, and Sundays.   Yes [provider]  cholecalciferol (VITAMIN D3) 25 MCG (1000 UT) tablet Take 1,000 Units by mouth daily.   Yes [provider]  cinacalcet (SENSIPAR) 60 MG tablet Take 60 mg by mouth every evening.    Yes [provider]  clopidogrel (PLAVIX) 75 MG tablet Take 75 mg by mouth daily. 05/24/19  Yes [provider]  midodrine (PROAMATINE) 10 MG tablet Take 10 mg by mouth every Monday, Wednesday, and Friday. before dialysis. 09/30/18  Yes [provider]  sevelamer (RENVELA) 800 MG tablet Take 1,600-3,200 mg by mouth See admin instructions. Take 3200  mg by mouth 3 times daily with full meals and take 1600 mg by mouth with snacks.   Yes [provider]  vitamin C (ASCORBIC ACID) 500 MG tablet Take 500 mg by mouth daily.   Yes [provider]    Current Medications Scheduled Meds: . vitamin C  500 mg Oral Daily  . atorvastatin  40 mg Oral Daily  . cinacalcet  60 mg Oral QPM  . docusate sodium  100 mg Oral BID  . heparin  5,000 Units Subcutaneous Q8H  . midodrine  10 mg Oral Q M,W,F  . multivitamin  1 tablet Oral QHS  . sevelamer carbonate  3,200 mg Oral TID with meals  . cholecalciferol  1,000 Units Oral Daily   Continuous Infusions: . methocarbamol (ROBAXIN) IV     PRN Meds:.bisacodyl, HYDROcodone-acetaminophen, methocarbamol **OR**  methocarbamol (ROBAXIN) IV, morphine injection, polyethylene glycol  CBC Recent Labs  Lab 06/30/19 0351  WBC 12.7*  NEUTROABS 10.6*  HGB 15.1  HCT 46.7  MCV 66.6*  PLT 254   Basic Metabolic Panel Recent Labs  Lab 06/30/19 0351  NA 138  K 4.3  CL 99  CO2 23  GLUCOSE 111*  BUN 36*  CREATININE 10.17*  CALCIUM 8.8*    Physical Exam  Blood pressure 107/60, pulse 86, temperature 97.8 F (36.6 C), temperature source Oral, resp. rate 16, height 5\' 5"  (1.651 m), weight 72.6 kg, SpO2 93 %. GEN: NAD ENT: NCAT EYES: EOMI CV:  Regular, normal S1 and S2, no rub PULM: Clear bilaterally, normal work of breathing ABD: Soft, nontender SKIN: Some ulcers of the distal left toes, right TMA well-healed EXT: No peripheral edema VASCULAR: Left thigh AV graft with bruit and thrill  Assessment 1M ESRD with R acute hip fracture after falling  1. ESRD L Thigh AVG, [facility] MWF. No outpt records; labs stable 2. R hip fracture, to transfer to [facility] for ortho eval 3. Anemia, Hb stalbe not an issue 4. 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 PVD 6. Hx/o CVA  Plan 1. Will plan for HD tomorrow at [facility], working around operative plans: tentative 4h, 2K, no heparin. 400/800, 1-2L UF   Rexene Agent  [phone] pgr 06/30/2019, 6:27 PM

## 2019-06-30 NOTE — ED Provider Notes (Addendum)
[facility] Provider Note   CSN: [id number] Arrival date & time: 06/30/19  0309     History Chief Complaint  Patient presents with  . Hip Pain    Joseph RINGER Sr. is a 79 y.o. male.  Patient with multiple medical problem including ESRD on dialysis, previous stroke, aortic stenosis, transmetatarsal amputation of right foot, peripheral vascular disease here with right hip pain after falling.  States he was having diarrhea since last night which is not unusual for him.  He had about 3 or 4 episodes of loose nonbloody stools.  States he fell trying to get to the bathroom and landed on his right hip.  Has had pain since with difficulty trying to walk.  Denies hitting his head or losing consciousness.  Denies head, neck, back, chest or abdominal pain.  Denies taking any blood thinners.  He is due for dialysis later this morning.  He does not make any urine.  He has a nonhealing wound to his right metatarsal surgical site for which he follows with Dr. Sharol Given. He is due to see him again in 2 days.  He denies using any antibiotics currently. He has chronic wounds to his left foot as well that are managed by podiatry.  He states he has poor blood flow to his leg ever since his dialysis fistula was moved to his left thigh about 4 months ago.  The history is provided by the patient and the EMS personnel.  Hip Pain Pertinent negatives include no chest pain, no abdominal pain, no headaches and no shortness of breath.       Past Medical History:  Diagnosis Date  . Anxiety   . Aortic stenosis   . Arthritis    left hand  . CVA (cerebral infarction)   . ESRD (end stage renal disease) on dialysis [facility])    M/W/F at [facility] in [place]  . Essential hypertension    resolved with HD  . Gangrene of right foot ([place])   . Gastric ulcer 2004  . GI bleed    gastric ulcer  . Heart murmur   . History of blood transfusion   . History of cardiomyopathy    LVEF normal as of February 2017    . History of gastric ulcer   . History of stroke    Left side weakness  . Iron deficiency anemia   . Peripheral vascular disease ([place])   . Stroke [facility]) 2000   limp left    Patient Active Problem List   Diagnosis Date Noted  . Malnutrition of moderate degree 06/18/2018  . Severe sepsis ([place]) 06/12/2018  . Leukocytosis 06/12/2018  . MRSA (methicillin resistant staph aureus) culture positive 06/12/2018  . Essential hypertension 06/12/2018  . ESRD (end stage renal disease) ([place]) 06/09/2018  . Gangrene of finger ([place]) 06/04/2018  . S/P transmetatarsal amputation of foot, right ([place]) 05/09/2017  . PVD (peripheral vascular disease) ([place]) 04/15/2017  . Nonischemic cardiomyopathy ([place]) 08/15/2015  . Aortic stenosis 08/15/2015  . Moderate aortic stenosis 07/12/2015  . 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 CAD- 40-50% LAD at cath 07/11/15 07/11/2015  . Aspirin intolerance 07/11/2015  . Abnormal stress test   . ESRD on dialysis ([place]) 05/30/2015  . CTS (carpal tunnel syndrome) 02/28/2015  . PVD of LE - Dr Oneida Alar follows 08/20/2012  . Midfoot skin ulcer, right, limited to breakdown of skin ([place]) 01/16/2012  . Encounter for screening colonoscopy 10/08/2011  . Melena 10/08/2011  . Other  complications due to renal dialysis device, implant, and graft 09/24/2011    Past Surgical History:  Procedure Laterality Date  . ABDOMINAL AORTAGRAM N/A 01/24/2012   Procedure: ABDOMINAL Maxcine Ham;  Surgeon: Elam Dutch, MD;  Location: [facility] CATH LAB;  Service: Cardiovascular;  Laterality: N/A;  . ABDOMINAL AORTOGRAM W/LOWER EXTREMITY N/A 04/15/2017   Procedure: ABDOMINAL AORTOGRAM W/LOWER EXTREMITY;  Surgeon: Angelia Mould, MD;  Location: [place] CV LAB;  Service: Cardiovascular;  Laterality: N/A;  . AMPUTATION Right 05/09/2017   Procedure: RIGHT TRANSMETATARSAL AMPUTATION;  Surgeon: [name], MD;  Location: [place];  Service: Orthopedics;  Laterality: Right;  .  AMPUTATION Right 06/13/2018   Procedure: AMPUTATION RIGHT LONG FINGER;  Surgeon: Dayna Barker, MD;  Location: [place];  Service: Plastics;  Laterality: Right;  . ARTERIOVENOUS GRAFT PLACEMENT Right right arm  . AV FISTULA PLACEMENT Left 08/31/2015   Procedure: ARTERIOVENOUS (AV) FISTULA CREATION- LEFT RADIOCEPHALIC;  Surgeon: Mal Misty, MD;  Location: [place];  Service: Vascular;  Laterality: Left;  . AV FISTULA PLACEMENT Right 02/18/2017   Procedure: INSERTION OF ARTERIOVENOUS (AV) GORE-TEX GRAFT  RIGHT UPPER ARM;  Surgeon: Conrad [place], MD;  Location: [place];  Service: Vascular;  Laterality: Right;  . AV FISTULA PLACEMENT Left 12/01/2018   Procedure: INSERTION OF ARTERIOVENOUS (AV) GORE-TEX GRAFT ARM;  Surgeon: Angelia Mould, MD;  Location: [place];  Service: Vascular;  Laterality: Left;  . AV FISTULA PLACEMENT Left 02/09/2019   Procedure: INSERTION OF ARTERIOVENOUS (AV) GORE-TEX GRAFT LEFT THIGH;  Surgeon: Waynetta Sandy, MD;  Location: [place];  Service: Vascular;  Laterality: Left;  . BASCILIC VEIN TRANSPOSITION Left 12/19/2015   Procedure: FIRST STAGE BRACHIAL VEIN TRANSPOSITION;  Surgeon: Conrad [place], MD;  Location: [place];  Service: Vascular;  Laterality: Left;  . BASCILIC VEIN TRANSPOSITION Left 02/08/2016   Procedure: SECOND STAGE BRACHIAL VEIN TRANSPOSITION;  Surgeon: Conrad [place], MD;  Location: [place];  Service: Vascular;  Laterality: Left;  . CARDIAC CATHETERIZATION N/A 07/11/2015   Procedure: Left Heart Cath and Coronary Angiography;  Surgeon: Troy Sine, MD;  Location: [place] CV LAB;  Service: Cardiovascular;  Laterality: N/A;  . Carpel Tunnel Left Dec. 22, 2016  . CHOLECYSTECTOMY    . COLONOSCOPY  2004   Dr. Irving Shows, left sided diverticula and cecal polyp, path unknown  . COLONOSCOPY  10/29/2011   Procedure: COLONOSCOPY;  Surgeon: Daneil Dolin, MD;  [facility];  Service: Endoscopy;  Laterality: N/A;  10:15  . ESOPHAGOGASTRODUODENOSCOPY   11/2002   Dr. Gala Romney, erosive reflux esophagitis, multiple gastric ulcer and antral/bulbar erosions. Serologies positive for H.Pylori and was treated  . ESOPHAGOGASTRODUODENOSCOPY  11/20014   Dr. Gala Romney, small hh only, ulcers healed  . ESOPHAGOGASTRODUODENOSCOPY  09/21/2011   Dr Trevor Iha [facility], antral erosions, ?early GAVE  . FISTULOGRAM Left 12/10/2016   Procedure: THROMBECTOMY OF LEFT ARM ARTERIOVENOUS FISTULA;  Surgeon: Waynetta Sandy, MD;  Location: [place];  Service: Vascular;  Laterality: Left;  . INSERTION OF DIALYSIS CATHETER Left 12/10/2016   Procedure: INSERTION OF TUNNELED DIALYSIS CATHETER;  Surgeon: Waynetta Sandy, MD;  Location: [place];  Service: Vascular;  Laterality: Left;  . INSERTION OF DIALYSIS CATHETER Right 06/09/2018   Procedure: INSERTION OF DIALYSIS CATHETER Right subclavian;  Surgeon: Angelia Mould, MD;  Location: [place];  Service: Vascular;  Laterality: Right;  . IR DIALY SHUNT INTRO [place] W/IMG RIGHT Right 01/01/2018  . IR GENERIC HISTORICAL  07/16/2016   IR REMOVAL TUN CV CATH W/O FL 07/16/2016 Saverio Danker, PA-C [facility]-INTERV RAD  . IR PTA ADDL CENTRAL DIALYSIS SEG THRU DIALY CIRCUIT RIGHT Right 10/21/2017  . IR REMOVAL TUN CV CATH W/O FL  05/12/2017  . IR THROMBECTOMY AV FISTULA W/THROMBOLYSIS/PTA INC/SHUNT/IMG RIGHT Right 10/21/2017  . IR THROMBECTOMY AV FISTULA W/THROMBOLYSIS/PTA INC/SHUNT/IMG RIGHT Right 11/27/2017  . IR US GUIDE VASC ACCESS RIGHT  10/21/2017  . IR US GUIDE VASC ACCESS RIGHT  11/27/2017  . IR US GUIDE VASC ACCESS RIGHT  01/01/2018  . LIGATION ARTERIOVENOUS GORTEX GRAFT Right 06/09/2018  . LIGATION ARTERIOVENOUS GORTEX GRAFT Right 06/09/2018   Procedure: LIGATION ARTERIOVENOUS GORTEX GRAFT RIGHT ARM;  Surgeon: Angelia Mould, MD;  Location: [place];  Service: Vascular;  Laterality: Right;  . LIGATION ARTERIOVENOUS GORTEX GRAFT Left 12/31/2018   Procedure: LIGATION OF LEFT UPPER ARM ARTERIVENOUS GORTEX GRAFT, LEFT  BRACHIAL ARTERY ENDARTERECTOMY WITH BOVINE PATCH ANGIOPLASTY;  Surgeon: Waynetta Sandy, MD;  Location: [place];  Service: Vascular;  Laterality: Left;  . LIGATION OF ARTERIOVENOUS  FISTULA Left 12/19/2015   Procedure: LIGATION OF RADIOCEPHALIC ARTERIOVENOUS  FISTULA;  Surgeon: Conrad [place], MD;  Location: [place];  Service: Vascular;  Laterality: Left;  Marland Kitchen MASS EXCISION Right 02/18/2017   Procedure: EXCISION OF RIGHT AXILLARY EPIDERMAL INCLUSION CYST;  Surgeon: Conrad [place], MD;  Location: [place];  Service: Vascular;  Laterality: Right;  . PERIPHERAL VASCULAR CATHETERIZATION N/A 12/14/2015   Procedure: Fistulagram;  Surgeon: Conrad , MD;  Location: [place] CV LAB;  Service: Cardiovascular;  Laterality: N/A;  . SHOULDER SURGERY Right    fracture  . ULTRASOUND GUIDANCE FOR VASCULAR ACCESS  04/15/2017   Procedure: Ultrasound Guidance For Vascular Access;  Surgeon: Angelia Mould, MD;  Location: [place] CV LAB;  Service: Cardiovascular;;  . UPPER EXTREMITY VENOGRAPHY Bilateral 12/17/2016   Procedure: Bilateral Upper Extremity Venography;  Surgeon: Serafina Mitchell, MD;  Location: [place] CV LAB;  Service: Cardiovascular;  Laterality: Bilateral;  . UPPER EXTREMITY VENOGRAPHY Left 11/03/2018   Procedure: UPPER EXTREMITY VENOGRAPHY;  Surgeon: Serafina Mitchell, MD;  Location: [place] CV LAB;  Service: Cardiovascular;  Laterality: Left;       Family History  Problem Relation Age of Onset  . Hypertension Mother   . Colon cancer Neg Hx   . Liver disease Neg Hx     Social History   Tobacco Use  . Smoking status: Former Smoker    Years: 25.00    Quit date: 04/29/2004    Years since quitting: 15.1  . Smokeless tobacco: Former Systems developer    Types: Chew    Quit date: 01/16/1987  . Tobacco comment: quit 2006  Substance Use Topics  . Alcohol use: No  . Drug use: No    Home Medications Prior to Admission medications   Medication Sig Start Date End Date Taking?  Authorizing Provider  atorvastatin (LIPITOR) 40 MG tablet Take 40 mg by mouth daily. 11/03/15   [provider]  b complex-vitamin c-folic acid (NEPHRO-VITE) 0.8 MG TABS Take 1 tablet by mouth See admin instructions. Take one tablet daily on Tuesdays, Thursdays, Saturdays, and Sundays.    [provider]  cholecalciferol (VITAMIN D3) 25 MCG (1000 UT) tablet Take 1,000 Units by mouth daily.    [provider]  cinacalcet (SENSIPAR) 60 MG tablet Take 60 mg by mouth every evening.     [provider]  midodrine (PROAMATINE) 10 MG tablet Take  10 mg by mouth every Monday, Wednesday, and Friday. before dialysis. 09/30/18   [provider]  sevelamer (RENVELA) 800 MG tablet Take 1,600-3,200 mg by mouth See admin instructions. Take 3200  mg by mouth 3 times daily with full meals and take 1600 mg by mouth with snacks.    [provider]  vitamin C (ASCORBIC ACID) 500 MG tablet Take 500 mg by mouth daily.    [provider]    Allergies    Aspirin  Review of Systems   Review of Systems  Constitutional: Positive for fatigue. Negative for activity change, appetite change and fever.  HENT: Negative for congestion and rhinorrhea.   Respiratory: Negative for cough, chest tightness and shortness of breath.   Cardiovascular: Negative for chest pain.  Gastrointestinal: Negative for abdominal pain, nausea and vomiting.  Genitourinary: Negative for dysuria and hematuria.  Musculoskeletal: Positive for arthralgias and myalgias.  Skin: Positive for wound. Negative for rash.  Neurological: Negative for dizziness, weakness and headaches.   all other systems are negative except as noted in the HPI and PMH.    Physical Exam Updated Vital Signs BP (!) 92/57 (BP Location: Left Arm)   Pulse (!) 107   Temp 97.8 F (36.6 C) (Oral)   Resp 16   Ht 5\' 5"  (1.651 m)   Wt 72.6 kg   SpO2 99%   BMI 26.63 kg/m   Physical Exam Vitals and nursing note  reviewed.  Constitutional:      General: He is not in acute distress.    Appearance: He is well-developed.     Comments: Chronically ill appearing  HENT:     Head: Normocephalic and atraumatic.     Mouth/Throat:     Pharynx: No oropharyngeal exudate.  Eyes:     Conjunctiva/sclera: Conjunctivae normal.     Pupils: Pupils are equal, round, and reactive to light.  Neck:     Comments: No meningismus. Cardiovascular:     Rate and Rhythm: Normal rate and regular rhythm.     Heart sounds: Normal heart sounds. No murmur.  Pulmonary:     Effort: Pulmonary effort is normal. No respiratory distress.     Breath sounds: Normal breath sounds.  Abdominal:     Palpations: Abdomen is soft.     Tenderness: There is no abdominal tenderness. There is no guarding or rebound.  Musculoskeletal:        General: Swelling and tenderness present. Normal range of motion.     Cervical back: Normal range of motion and neck supple.     Comments: Right anterior lateral hip tenderness without shortening or external rotation.  Transmetatarsal amputation of right foot with healing wound  Multiple gangrenous appearing toes of left foot (3rd and 4th) as depicted  DP pulses palpable with Doppler bilaterally, left is weaker than right ATA pulse present with Doppler on L No PT pulses bilaterally  Dialysis fistula with thrill of the left thigh  Skin:    General: Skin is warm.  Neurological:     Mental Status: He is alert and oriented to person, place, and time.     Cranial Nerves: No cranial nerve deficit.     Motor: No abnormal muscle tone.     Coordination: Coordination normal.     Comments: No ataxia on finger to nose bilaterally. No pronator drift. 5/5 strength throughout. CN 2-12 intact.Equal grip strength. Sensation intact.   Psychiatric:        Behavior: Behavior normal.  ED Results / Procedures / Treatments   Labs (all labs ordered are listed, but only abnormal results are  displayed) Labs Reviewed  CBC WITH DIFFERENTIAL/PLATELET - Abnormal; Notable for the following components:      Result Value   WBC 12.7 (*)    RBC 7.01 (*)    MCV 66.6 (*)    MCH 21.5 (*)    RDW 19.2 (*)    Neutro Abs 10.6 (*)    All other components within normal limits  BASIC METABOLIC PANEL - Abnormal; Notable for the following components:   Glucose, Bld 111 (*)    BUN 36 (*)    Creatinine, Ser 10.17 (*)    Calcium 8.8 (*)    GFR calc non Af Amer 4 (*)    GFR calc Af Amer 5 (*)    Anion gap 16 (*)    All other components within normal limits  RESPIRATORY PANEL BY RT PCR (FLU A&B, COVID)    EKG EKG Interpretation  Date/Time:  Wednesday June 30 2019 06:08:36 EST Ventricular Rate:  90 PR Interval:    QRS Duration: 150 QT Interval:  417 QTC Calculation: 511 R Axis:   -134 Text Interpretation: Sinus rhythm Right bundle branch block Probable inferior infarct, recent No significant change was found Confirmed by Ezequiel Essex [phone]) on 06/30/2019 6:11:19 AM   Radiology DG Chest 1 View  Result Date: 06/30/2019 CLINICAL DATA:  Fall fall today EXAM: CHEST  1 VIEW COMPARISON:  August 02, 2018 FINDINGS: The heart size and mediastinal contours are within normal limits. Aortic knob calcifications. Both lungs are clear. No acute osseous findings. Prior fixation seen in the right shoulder. IMPRESSION: No active disease. Electronically Signed   By: Prudencio Pair M.D.   On: 06/30/2019 04:54   DG Foot Complete Left  Result Date: 06/30/2019 CLINICAL DATA:  Gangrene of the toes and pain after fall EXAM: LEFT FOOT - COMPLETE 3+ VIEW COMPARISON:  None. FINDINGS: No fracture or dislocation. There is diffuse osteopenia. Erosive type change seen at the distal third and fifth phalanx. Calcaneal enthesophytes are seen. Dense vascular calcifications are noted. IMPRESSION: No acute fracture. Erosive changes with the distal third and fifth phalanx. Electronically Signed   By: Prudencio Pair M.D.   On:  06/30/2019 04:55   DG Hip Unilat W or Wo Pelvis 2-3 Views Right  Result Date: 06/30/2019 CLINICAL DATA:  Fall and hip pain EXAM: DG HIP (WITH OR WITHOUT PELVIS) 2-3V RIGHT COMPARISON:  None. FINDINGS: There is a nondisplaced fracture seen through the greater trochanter with slight impaction and a probable nondisplaced intratrochanteric fracture. The femoral head is still well seated within the acetabulum. There is diffuse osteopenia. Dense vascular calcifications are noted. IMPRESSION: Nondisplaced greater trochanter and intertrochanteric fractures. Electronically Signed   By: Prudencio Pair M.D.   On: 06/30/2019 04:53   DG FEMUR, MIN 2 VIEWS RIGHT  Result Date: 06/30/2019 CLINICAL DATA:  Fall EXAM: RIGHT FEMUR 2 VIEWS COMPARISON:  None. FINDINGS: There is nondisplaced mildly impacted fracture seen through the greater trochanter and intertrochanteric regions. No other fractures are seen. There is diffuse osteopenia. Dense vascular calcifications are noted. IMPRESSION: Nondisplaced greater trochanter and superior intertrochanteric fracture. Electronically Signed   By: Prudencio Pair M.D.   On: 06/30/2019 04:53    Procedures Ultrasound ED Peripheral IV (Provider)  Date/Time: 06/30/2019 5:49 AM Performed by: Ezequiel Essex, MD Authorized by: Ezequiel Essex, MD   Procedure details:    Indications: multiple failed IV attempts and  poor IV access     Skin Prep: chlorhexidine gluconate     Location: [facility].   Angiocath:  18 G   Bedside Ultrasound Guided: Yes     Images: not archived     Patient tolerated procedure without complications: Yes     Dressing applied: Yes     (including critical care time)  Medications Ordered in ED Medications - No data to display  ED Course  I have reviewed the triage vital signs and the nursing notes.  Pertinent labs & imaging results that were available during my care of the patient were reviewed by me and considered in my medical decision making (see chart for  details).    MDM Rules/Calculators/A&P                      Patient with multiple medical problems here with right hip pain after fall.  Denies any head injury.  Patient is awake and alert.  His blood pressure is mildly soft at 92 systolic on arrival.  Labs are reassuring with normal potassium.  Creatinine consistent with ESRD.  X-rays remarkable for greater trochanter and intertrochanteric fracture on the right.  Left foot x-ray consistent with gangrenous toes.  He does have dopplerable pulses.  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 Lorin Mercy on-call for Dr. Sharol Given.  He agrees with nonweightbearing and transfer to [facility] for evaluation by Dr. Sharol Given later this morning.  He asks that the hospitalist team call Dr. Sharol Given once patient arrives.  Admission d/w Dr. Scherrie November who will arrange transfer to [facility]. Final Clinical Impression(s) / ED Diagnoses Final diagnoses:  Fall, initial encounter  Closed nondisplaced intertrochanteric fracture of right femur, initial encounter [facility]    Rx / DC Orders ED Discharge Orders    None       Jesusmanuel Erbes, Annie Main, MD 06/30/19 0315    Ezequiel Essex, MD 07/12/19 1641

## 2019-06-30 NOTE — Progress Notes (Signed)
Patient admitted to the hospital earlier this morning by Dr. Scherrie November.  Patient seen and examined. Reports that his pain is currently controlled. No chest pain or shortness of breath  79 year old male with a history of end-stage renal disease on hemodialysis, MWF, history of right transmetatarsal amputation in the past by Dr. Sharol Given, presents to the hospital after having a fall.  He denies any presyncopal symptoms.  Denies any loss of consciousness.  He did not have any chest pain or shortness of breath.  On arrival to the emergency room, lab work is unrevealing.  X-rays show right-sided nondisplaced greater trochanter and superior intertrochanteric fracture.  Acute erosive changes with distal third and fifth phalanx which are likely chronic changes.  EDP has discussed case with Dr. Lorin Mercy at [facility], who advised transfer to [facility] for further care. He has requested that Dr. Sharol Given be informed on patient arrival. I suspect that changes of dry gangrene of left foot are chronic.I have also informed nephrology service (Dr. Augustin Coupe) of patient transfer, since he is due for dialysis today.  [employer]

## 2019-06-30 NOTE — ED Notes (Signed)
Spoke with Roderic Palau, MD who states that patient may have diet in place today. States pt does not require dialysis today as labs look stable. PT will receive dialysis tomorrow whether at [facility] or [facility]. Pt notified of decisions per Dr. Roderic Palau.

## 2019-07-01 ENCOUNTER — Ambulatory Visit: Payer: Medicare Other | Admitting: Physician Assistant

## 2019-07-01 DIAGNOSIS — I2510 Atherosclerotic heart disease of native coronary artery without angina pectoris: Secondary | ICD-10-CM

## 2019-07-01 LAB — GLUCOSE, CAPILLARY: Glucose-Capillary: 78 mg/dL (ref 70–99)

## 2019-07-01 LAB — CBC
HCT: 42.1 % (ref 39.0–52.0)
Hemoglobin: 13.6 g/dL (ref 13.0–17.0)
MCH: 21.3 pg — ABNORMAL LOW (ref 26.0–34.0)
MCHC: 32.3 g/dL (ref 30.0–36.0)
MCV: 66 fL — ABNORMAL LOW (ref 80.0–100.0)
Platelets: 264 10*3/uL (ref 150–400)
RBC: 6.38 MIL/uL — ABNORMAL HIGH (ref 4.22–5.81)
RDW: 18 % — ABNORMAL HIGH (ref 11.5–15.5)
WBC: 13.3 10*3/uL — ABNORMAL HIGH (ref 4.0–10.5)
nRBC: 0 % (ref 0.0–0.2)

## 2019-07-01 LAB — RENAL FUNCTION PANEL
Albumin: 2.5 g/dL — ABNORMAL LOW (ref 3.5–5.0)
Anion gap: 17 — ABNORMAL HIGH (ref 5–15)
BUN: 46 mg/dL — ABNORMAL HIGH (ref 8–23)
CO2: 23 mmol/L (ref 22–32)
Calcium: 8.7 mg/dL — ABNORMAL LOW (ref 8.9–10.3)
Chloride: 95 mmol/L — ABNORMAL LOW (ref 98–111)
Creatinine, Ser: 12.57 mg/dL — ABNORMAL HIGH (ref 0.61–1.24)
GFR calc Af Amer: 4 mL/min — ABNORMAL LOW (ref >60)
GFR calc non Af Amer: 3 mL/min — ABNORMAL LOW (ref >60)
Glucose, Bld: 129 mg/dL — ABNORMAL HIGH (ref 70–99)
Phosphorus: 6 mg/dL — ABNORMAL HIGH (ref 2.5–4.6)
Potassium: 5.3 mmol/L — ABNORMAL HIGH (ref 3.5–5.1)
Sodium: 135 mmol/L (ref 135–145)

## 2019-07-01 LAB — HEPATITIS B CORE ANTIBODY, TOTAL: Hep B Core Total Ab: NONREACTIVE

## 2019-07-01 MED ORDER — PENTAFLUOROPROP-TETRAFLUOROETH EX AERO: 1.0000 "application " | INHALATION_SPRAY | CUTANEOUS | Status: DC | PRN

## 2019-07-01 MED ORDER — HEPARIN SODIUM (PORCINE) 1000 UNIT/ML DIALYSIS: 1000.0000 [IU] | INTRAMUSCULAR | Status: DC | PRN

## 2019-07-01 MED ORDER — PRO-STAT SUGAR FREE PO LIQD
30.0000 mL | Freq: Two times a day (BID) | ORAL | Status: DC
  Administered 2019-07-01 – 2019-07-04 (×6): 30 mL via ORAL
  Filled 2019-07-01 (×6): qty 30

## 2019-07-01 MED ORDER — SODIUM CHLORIDE 0.9 % IV SOLN: 100.0000 mL | INTRAVENOUS | Status: DC | PRN

## 2019-07-01 MED ORDER — ALTEPLASE 2 MG IJ SOLR: 2.0000 mg | Freq: Once | INTRAMUSCULAR | Status: DC | PRN

## 2019-07-01 MED ORDER — CHLORHEXIDINE GLUCONATE CLOTH 2 % EX PADS
6.0000 | MEDICATED_PAD | Freq: Every day | CUTANEOUS | Status: DC
  Administered 2019-07-01 – 2019-07-04 (×3): 6 via TOPICAL

## 2019-07-01 MED ORDER — LIDOCAINE-PRILOCAINE 2.5-2.5 % EX CREA: 1.0000 "application " | TOPICAL_CREAM | CUTANEOUS | Status: DC | PRN

## 2019-07-01 MED ORDER — LIDOCAINE HCL (PF) 1 % IJ SOLN: 5.0000 mL | INTRAMUSCULAR | Status: DC | PRN

## 2019-07-01 NOTE — Progress Notes (Signed)
PROGRESS NOTE    [name]  NKN:[id number] DOB: [birth year] DOA: 06/30/2019 PCP: [name], MD     Brief Narrative:  79 y/o admitted from home after a mechanical fall resulted in a Nondisplaced greater trochanter and superior intertrochanteric fracture. 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 He is admitted for presumptive surgery as a transfer from [place]:   Principal Problem:   Closed right hip fracture ([place]) Active Problems:   ESRD on dialysis ([place])   Non-ischemic cardiomyopathy- EF 35- 45%   CAD- 40-50% LAD at cath 07/11/15   Moderate aortic stenosis   PVD (peripheral vascular disease) ([place])   Essential hypertension   Closed intertrochanteric fracture of right hip (HCC)   Hip fracture (H[facility])   Right Hip fx -Discussed case with Dr. Sharol Given: no surgical intervention is necessary. -Start PT/OT with [facility]. -Start diet.  ESRD on HD -Due for HD today. -Nephrology is on board.  PVD -S/p right mid foot amputation. -chronic left toe wounds managed by ortho and podiatry.  NICM -Stable, compensated.  H/o CVA -Noted  Hyperlipidemia -Continue Lipitor  Moderate Aortic Stenosis -Mindful IVF.   DVT prophylaxis: SQ heparin Code Status: Full code Family Communication: patient only Disposition Plan: Likely home over next 24 hours pending ortho consultation and PT/OT evaluations.  Consultants:   Orthopedics, Dr. Sharol Given  Procedures:   None  Antimicrobials:  Anti-infectives (From admission, onward)   None       Subjective: In bed, no  complaints.  Objective: Vitals:   07/01/19 0130 07/01/19 0234 07/01/19 0319 07/01/19 0757  BP: (!) 142/80 127/75 124/70 (!) 98/51  Pulse: 87 87 90 84  Resp:  16 18 18   Temp:  97.7 F (36.5 C) 97.7 F (36.5 C) 98.2 F (36.8 C)  TempSrc:  Oral Oral Oral  SpO2: 98% 94% 98% 96%  Weight:      Height:       No intake or output data in the 24 hours ending 07/01/19 1103 Filed Weights   06/30/19 0312  Weight: 72.6 kg    Examination:  General exam: Alert, awake, oriented x 3 Respiratory system: Clear to auscultation. Respiratory effort normal. Cardiovascular system:RRR. No murmurs, rubs, gallops. Gastrointestinal system: Abdomen is nondistended, soft and nontender. No organomegaly or masses felt. Normal bowel sounds heard. Central nervous system: Alert and oriented. No focal neurological deficits. Extremities: Right mid foot amputation, left with dry gangrenous changes of several toes. No edema. Psychiatry: Judgement and insight appear normal. Mood & affect appropriate.     Data Reviewed: I have personally reviewed following labs and imaging studies  CBC: Recent Labs  Lab 06/30/19 0351  WBC 12.7*  NEUTROABS 10.6*  HGB 15.1  HCT 46.7  MCV 66.6*  PLT 379   Basic Metabolic Panel: Recent Labs  Lab 06/30/19 0351  NA 138  K 4.3  CL 99  CO2 23  GLUCOSE 111*  BUN 36*  CREATININE 10.17*  CALCIUM 8.8*   GFR: Estimated Creatinine Clearance: 5.4 mL/min (A) (by C-G formula based on SCr of 10.17 mg/dL (H)). Liver Function Tests: No results for input(s): AST, ALT, ALKPHOS, BILITOT, PROT, ALBUMIN in the last 168  hours. No results for input(s): LIPASE, AMYLASE in the last 168 hours. No results for input(s): AMMONIA in the last 168 hours. Coagulation Profile: No results for input(s): INR, PROTIME in the last 168 hours. Cardiac Enzymes: No results for input(s): CKTOTAL, CKMB, CKMBINDEX, TROPONINI in the last 168 hours. BNP (last 3 results) No results for input(s): PROBNP in  the last 8760 hours. HbA1C: No results for input(s): HGBA1C in the last 72 hours. CBG: Recent Labs  Lab 07/01/19 0640  GLUCAP 78   Lipid Profile: No results for input(s): CHOL, HDL, LDLCALC, TRIG, CHOLHDL, LDLDIRECT in the last 72 hours. Thyroid Function Tests: No results for input(s): TSH, T4TOTAL, FREET4, T3FREE, THYROIDAB in the last 72 hours. Anemia Panel: No results for input(s): VITAMINB12, FOLATE, FERRITIN, TIBC, IRON, RETICCTPCT in the last 72 hours. Urine analysis: No results found for: COLORURINE, APPEARANCEUR, LABSPEC, PHURINE, GLUCOSEU, HGBUR, BILIRUBINUR, KETONESUR, PROTEINUR, UROBILINOGEN, NITRITE, LEUKOCYTESUR Sepsis Labs: @LABRCNTIP (procalcitonin:4,lacticidven:4)  ) Recent Results (from the past 240 hour(s))  Respiratory Panel by RT PCR (Flu A&B, Covid) - Nasopharyngeal Swab     Status: None   Collection Time: 06/30/19  5:16 AM   Specimen: Nasopharyngeal Swab  Result Value Ref Range Status   SARS Coronavirus 2 by RT PCR NEGATIVE NEGATIVE Final    Comment: (NOTE) SARS-CoV-2 target nucleic acids are NOT DETECTED. The SARS-CoV-2 RNA is generally detectable in upper respiratoy specimens during the acute phase of infection. The lowest concentration of SARS-CoV-2 viral copies this assay can detect is 131 copies/mL. A negative result does not preclude SARS-Cov-2 infection and should not be used as the sole basis for treatment or other patient management decisions. A negative result may occur with  improper specimen collection/handling, submission of specimen other than nasopharyngeal swab, presence of viral mutation(s) within the areas targeted by this assay, and inadequate number of viral copies (<131 copies/mL). A negative result must be combined with clinical observations, patient history, and epidemiological information. The expected result is Negative. Fact Sheet for Patients:  PinkCheek.be Fact Sheet for Healthcare Providers:    GravelBags.it This test is not yet ap proved or cleared by the [place] FDA and  has been authorized for detection and/or diagnosis of SARS-CoV-2 by FDA under an Emergency Use Authorization (EUA). This EUA will remain  in effect (meaning this test can be used) for the duration of the COVID-19 declaration under Section 564(b)(1) of the Act, 21 U.S.C. section 360bbb-3(b)(1), unless the authorization is terminated or revoked sooner.    Influenza A by PCR NEGATIVE NEGATIVE Final   Influenza B by PCR NEGATIVE NEGATIVE Final    Comment: (NOTE) The Xpert Xpress SARS-CoV-2/FLU/RSV assay is intended as an aid in  the diagnosis of influenza from Nasopharyngeal swab specimens and  should not be used as a sole basis for treatment. Nasal washings and  aspirates are unacceptable for Xpert Xpress SARS-CoV-2/FLU/RSV  testing. Fact Sheet for Patients: PinkCheek.be Fact Sheet for Healthcare Providers: GravelBags.it This test is not yet approved or cleared by the [place] FDA and  has been authorized for detection and/or diagnosis of SARS-CoV-2 by  FDA under an Emergency Use Authorization (EUA). This EUA will remain  in effect (meaning this test can be used) for the duration of the  Covid-19 declaration under Section 564(b)(1) of the Act, 21  U.S.C. section 360bbb-3(b)(1), unless the authorization is  terminated or revoked. Performed at [facility], [address], [place], [place] [address]          Radiology Studies: DG  Chest 1 View  Result Date: 06/30/2019 CLINICAL DATA:  Fall fall today EXAM: CHEST  1 VIEW COMPARISON:  August 02, 2018 FINDINGS: The heart size and mediastinal contours are within normal limits. Aortic knob calcifications. Both lungs are clear. No acute osseous findings. Prior fixation seen in the right shoulder. IMPRESSION: No active disease. Electronically Signed   By: Prudencio Pair  M.D.   On: 06/30/2019 04:54   DG Foot Complete Left  Result Date: 06/30/2019 CLINICAL DATA:  Gangrene of the toes and pain after fall EXAM: LEFT FOOT - COMPLETE 3+ VIEW COMPARISON:  None. FINDINGS: No fracture or dislocation. There is diffuse osteopenia. Erosive type change seen at the distal third and fifth phalanx. Calcaneal enthesophytes are seen. Dense vascular calcifications are noted. IMPRESSION: No acute fracture. Erosive changes with the distal third and fifth phalanx. Electronically Signed   By: Prudencio Pair M.D.   On: 06/30/2019 04:55   DG Hip Unilat W or Wo Pelvis 2-3 Views Right  Result Date: 06/30/2019 CLINICAL DATA:  Fall and hip pain EXAM: DG HIP (WITH OR WITHOUT PELVIS) 2-3V RIGHT COMPARISON:  None. FINDINGS: There is a nondisplaced fracture seen through the greater trochanter with slight impaction and a probable nondisplaced intratrochanteric fracture. The femoral head is still well seated within the acetabulum. There is diffuse osteopenia. Dense vascular calcifications are noted. IMPRESSION: Nondisplaced greater trochanter and intertrochanteric fractures. Electronically Signed   By: Prudencio Pair M.D.   On: 06/30/2019 04:53   DG FEMUR, MIN 2 VIEWS RIGHT  Result Date: 06/30/2019 CLINICAL DATA:  Fall EXAM: RIGHT FEMUR 2 VIEWS COMPARISON:  None. FINDINGS: There is nondisplaced mildly impacted fracture seen through the greater trochanter and intertrochanteric regions. No other fractures are seen. There is diffuse osteopenia. Dense vascular calcifications are noted. IMPRESSION: Nondisplaced greater trochanter and superior intertrochanteric fracture. Electronically Signed   By: Prudencio Pair M.D.   On: 06/30/2019 04:53        Scheduled Meds: . vitamin C  500 mg Oral Daily  . atorvastatin  40 mg Oral Daily  . Chlorhexidine Gluconate Cloth  6 each Topical Q0600  . cinacalcet  60 mg Oral QPM  . docusate sodium  100 mg Oral BID  . feeding supplement (PRO-STAT SUGAR FREE 64)  30 mL Oral  BID  . heparin  5,000 Units Subcutaneous Q8H  . midodrine  10 mg Oral Q M,W,F  . multivitamin  1 tablet Oral QHS  . sevelamer carbonate  3,200 mg Oral TID with meals  . cholecalciferol  1,000 Units Oral Daily   Continuous Infusions: . methocarbamol (ROBAXIN) IV       LOS: 1 day    Time spent: 35 minutes. Greater than 50% of this time was spent in direct contact with the patient, coordinating care and discussing relevant ongoing clinical issues, including formulating and discussing plan of care with orthopedics.     Lelon Frohlich, MD [facility] Pager [phone]  If 7PM-7AM, please contact night-coverage [email or web address] Password [email or web address] 07/01/2019, 11:03 AM

## 2019-07-01 NOTE — Evaluation (Addendum)
Physical Therapy Evaluation Patient Details Name: Joseph KOPE Sr. MRN: [id number] DOB: 10-12-1940 Today's Date: 07/01/2019   History of Present Illness  79 yo admitted with right hip fx with non operative management. PMHx: NICM, ESRD, CAD, PVD, HTN, RT transmet amputation  Clinical Impression  Pt pleasant and reports no pain at rest. Pt lives alone and had been going to HD for 22 years with multiple digit amputations and Rt transmet. Pt able to transfer to EOB but unable to tolerate weight on RLE and difficulty even extending knee fully in sitting and standing. Pt with decreased strength, transfers, mobility and function who does not have assist at home and will benefit from acute therapy to maximize mobility and function.      Follow Up Recommendations SNF;Supervision/Assistance - 24 hour    Equipment Recommendations  Wheelchair (measurements PT);Wheelchair cushion (measurements PT);3in1 (PT)    Recommendations for Other Services OT consult     Precautions / Restrictions Precautions Precautions: Fall Restrictions RLE Weight Bearing: Weight bearing as tolerated      Mobility  Bed Mobility Overal bed mobility: Needs Assistance Bed Mobility: Supine to Sit;Sit to Supine     Supine to sit: Min guard;HOB elevated Sit to supine: Min assist   General bed mobility comments: HOB 30 degrees with increased time to pivot to EOB toward left. with return to bed assist to move RLE onto surface  Transfers Overall transfer level: Needs assistance   Transfers: Sit to/from Stand;Stand Pivot Transfers Sit to Stand: Min assist Stand pivot transfers: Min assist       General transfer comment: cues for hand placement with assist to rise from bed and chair and pivot with use of RW bed<>chair. Pt in recliner when RN notified HD coming and pt returned to bed. PT unable to tolerate significant weight on RLE and unable to off weight leg fully with bil UE to hop  Ambulation/Gait              General Gait Details: unable  Stairs            Wheelchair Mobility    Modified Rankin (Stroke Patients Only)       Balance Overall balance assessment: Needs assistance;History of Falls   Sitting balance-Leahy Scale: Good Sitting balance - Comments: EOB without assist     Standing balance-Leahy Scale: Poor Standing balance comment: bil UE support on RW due to pain.                             Pertinent Vitals/Pain Pain Assessment: 0-10 Pain Score: 9  Pain Location: right hip in standing Pain Descriptors / Indicators: Aching;Guarding;Throbbing;Constant Pain Intervention(s): Limited activity within patient's tolerance;Monitored during session;Patient requesting pain meds-RN notified;Repositioned    Home Living Family/patient expects to be discharged to:: Private residence Living Arrangements: Alone   Type of Home: Apartment Home Access: Level entry     Home Layout: One level Home Equipment: [place] - 2 wheels;Cane - single point      Prior Function Level of Independence: Independent with assistive device(s)         Comments: uses a cane, drives to HD and cares for himself     Hand Dominance        Extremity/Trunk Assessment   Upper Extremity Assessment Upper Extremity Assessment: Overall WFL for tasks assessed    Lower Extremity Assessment Lower Extremity Assessment: Generalized weakness;RLE deficits/detail RLE Deficits / Details: decreased ROM, strength and weight  bearing due to pain    Cervical / Trunk Assessment Cervical / Trunk Assessment: Normal  Communication   Communication: No difficulties  Cognition Arousal/Alertness: Awake/alert Behavior During Therapy: WFL for tasks assessed/performed Overall Cognitive Status: Within Functional Limits for tasks assessed                                        General Comments General comments (skin integrity, edema, etc.): PT reports 2 falls recently    Exercises  General Exercises - Lower Extremity Long Arc Quad: AROM;Both;Seated;10 reps Hip Flexion/Marching: AROM;Both;Seated;10 reps   Assessment/Plan    PT Assessment Patient needs continued PT services  PT Problem List Decreased strength;Decreased mobility;Decreased safety awareness;Decreased range of motion;Decreased activity tolerance;Decreased balance;Decreased knowledge of use of DME;Pain       PT Treatment Interventions Gait training;Balance training;Stair training;Functional mobility training;Therapeutic activities;Patient/family education;DME instruction;Therapeutic exercise    PT Goals (Current goals can be found in the Care Plan section)  Acute Rehab PT Goals Patient Stated Goal: return home PT Goal Formulation: With patient Time For Goal Achievement: 07/15/19 Potential to Achieve Goals: Fair    Frequency Min 4X/week   Barriers to discharge Decreased caregiver support      Co-evaluation               AM-PAC PT "6 Clicks" Mobility  Outcome Measure Help needed turning from your back to your side while in a flat bed without using bedrails?: A Little Help needed moving from lying on your back to sitting on the side of a flat bed without using bedrails?: A Little Help needed moving to and from a bed to a chair (including a wheelchair)?: A Little Help needed standing up from a chair using your arms (e.g., wheelchair or bedside chair)?: A Little Help needed to walk in hospital room?: A Lot Help needed climbing 3-5 steps with a railing? : Total 6 Click Score: 15    End of Session Equipment Utilized During Treatment: Gait belt Activity Tolerance: Patient limited by pain Patient left: in bed;with call bell/phone within reach Nurse Communication: Mobility status PT Visit Diagnosis: Other abnormalities of gait and mobility (R26.89);Difficulty in walking, not elsewhere classified (R26.2);Pain Pain - Right/Left: Right Pain - part of body: Hip    Time: 1610-9604 PT Time  Calculation (min) (ACUTE ONLY): 18 min   Charges:   PT Evaluation $PT Eval Moderate Complexity: 1 Mod          [name], PT Acute Rehabilitation Services Pager: [phone] Office: [phone]   [name] 07/01/2019, 12:29 PM

## 2019-07-01 NOTE — Progress Notes (Signed)
OT Cancellation Note  Patient Details Name: [name]. MRN: [id number] DOB: May 28, 1940   Cancelled Treatment:    Reason Eval/Treat Not Completed: Patient at procedure or test/ unavailable. Will check back tomorrow  Rickie Gange 07/01/2019, 2:52 PM  Karsten Ro, OTR/L Acute Rehabilitation Services 07/01/2019

## 2019-07-01 NOTE — Progress Notes (Signed)
Initial Nutrition Assessment  DOCUMENTATION CODES:   Not applicable  INTERVENTION:   -Continue renal MVI daily -30 ml Prostat BID, each supplement provides 100 kcals and 15 grams protein  NUTRITION DIAGNOSIS:   Increased nutrient needs related to chronic illness(ESRD on HD) as evidenced by estimated needs.  GOAL:   Patient will meet greater than or equal to 90% of their needs  MONITOR:   PO intake, Supplement acceptance, Labs, Weight trends, Skin, I & O's  REASON FOR ASSESSMENT:   Consult Hip fracture protocol  ASSESSMENT:   Joseph Maryland Sr. is a 79 y.o. 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  Patient states he has been having some loose stools overnight which is not unusual for him.  He woke up last night trying to get to the bathroom and had a mechanical fall and landed on his right hip.  He had significant pain on the right hip and has had difficulty with walking since then.  No head injury or loss of consciousness reported.  No back or neck pain.  Has a history of end-stage renal disease and is on hemodialysis.  Next dialysis due for this morning.  Does not make any urine at baseline.  Pt admitted with rt hip fracture s/p mechanical fall.   Reviewed I/O's: +500 ml x 24 hours  Per nephrology notes, plan for HD today. Pt is still awaiting orthopedic evaluation.   Spoke with pt at bedside, who was pleasant and in good spirits today. He shares that he ate a good breakfast this morning (pancakes, eggs, and applesauce), as he had  not eaten since Monday morning. Pt shares he has a fair appetite at home and he usually grazes throughout the day. Pt did not provide a diet recall despite probing, but shares that commonly consumed foods include hot dogs and a lot of fresh fruits (bananas, oranges, and grapes). He consumes mostly water and soft drinks, but also sucks on hard candy to avoid dry mouth, especially during HD treatments.   Pt shares that he has been on HD for several years and has been tolerating treatments well. Per his report, his dry wt [place] 69#, which has been stable. He reports his UBW is around 170-180# and estimates he has lost about 20# within the past 6 months, however, this is not consistent with documented wt hx.   Discussed importance of good meal and supplement intake to promote healing. Pt with no further nutrition-related questions, but expressed appreciation for visit.   Medications reviewed and include colace.   Labs reviewed: CBGS: 78.   NUTRITION - FOCUSED PHYSICAL EXAM:    Most Recent Value  Orbital Region  No depletion  Upper Arm Region  No depletion  Thoracic and Lumbar Region  No depletion  Buccal Region  No depletion  Temple Region  Mild depletion  Clavicle Bone Region  No depletion  Clavicle and Acromion Bone Region  No depletion  Scapular Bone Region  No depletion  Dorsal Hand  Mild depletion  Patellar Region  Mild depletion  Anterior Thigh Region  Mild depletion  Posterior Calf  Region  Mild depletion  Edema (RD Assessment)  None  Hair  Reviewed  Eyes  Reviewed  Mouth  Reviewed  Skin  Reviewed  Nails  Reviewed     Diet Order:   Diet Order            Diet renal with fluid restriction Fluid restriction: 1200 mL Fluid; Room service appropriate? Yes; Fluid consistency: Thin  Diet effective now              EDUCATION NEEDS:   Education needs have been addressed  Skin:  Skin Assessment: Skin Integrity Issues: Skin Integrity Issues:: Diabetic Ulcer, Other  (Comment) Diabetic Ulcer: lt toe Other: open wound to rt knee  Last BM:  [place]  Height:   Ht Readings from Last 1 Encounters:  06/30/19 5\' 5"  (1.651 m)    Weight:   Wt Readings from Last 1 Encounters:  06/30/19 72.6 kg    Ideal Body Weight:  61.8 kg  BMI:  Body mass index is 26.63 kg/m.  Estimated Nutritional Needs:   Kcal:  2050-2250  Protein:  105-120 grams  Fluid:  1000 ml + UOP    Loistine Chance, RD, LDN, [place] Registered Dietitian II Certified Diabetes Care and Education Specialist Please refer to [facility] for RD and/or RD on-call/weekend/after hours pager

## 2019-07-01 NOTE — Progress Notes (Signed)
OT Cancellation Note  Patient Details Name: Joseph MENNING Sr. MRN: [id number] DOB: [birth year]   Cancelled Treatment:    Reason Eval/Treat Not Completed: Active bedrest order:  Awaiting sx consult  Naja Apperson 07/01/2019, 7:25 AM  Karsten Ro, OTR/L Acute Rehabilitation Services 07/01/2019

## 2019-07-01 NOTE — Progress Notes (Signed)
PT Cancellation Note  Patient Details Name: Joseph CARDIN Sr. MRN: [id number] DOB: 1940-12-10   Cancelled Treatment:    Reason Eval/Treat Not Completed: Patient not medically ready(pt awaiting orthopedic surgery consult)   Vinaya Sancho B Kaarin Pardy 07/01/2019, 7:21 AM  [place] Pager: [phone] Office: [phone]

## 2019-07-01 NOTE — Progress Notes (Signed)
[facility] Progress Note   79 year old male admitted following a fall Tuesday found to have a right  nondisplaced greater trochanter and superior intertrochanteric fracture.    He has ESRD on HD at [facility] on [address], MWF using a left thigh AV graft.  Work-up of his fall identified right sided. Patient's last dialysis was on 3/1.    Other past history includes history of CVA, PVD, PUD, right foot TMA, ulcers of the left toes (followed by Dr. Sharol Given).  Assessment:   1. ESRD L Thigh AVG, [facility] MWF. No outpt records; labs stable 2. R hip fracture, to transfer to [facility] for ortho eval 3. Anemia, Hb stalbe not an issue 4. 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 PVD 6. Hx/o CVA  Plan 1. Will plan for HD today; I don't see any plans for surgery today.   Subjective:   Rt hip pain but denies dyspnea, CP, n/v.   Objective:   BP 124/70 (BP Location: Right Leg)   Pulse 90   Temp 97.7 F (36.5 C) (Oral)   Resp 18   Ht 5\' 5"  (1.651 m)   Wt 72.6 kg   SpO2 98%   BMI 26.63 kg/m   Intake/Output Summary (Last 24 hours) at 07/01/2019 0735 Last data filed at 06/30/2019 1049 Gross per 24 hour  Intake 500 ml  Output --  Net 500 ml   Weight change:   Physical Exam: GEN: NAD CV: Regular, normal S1 and S2, no rub PULM: Clear bilaterally, normal work of breathing ABD: Soft, nontender SKIN: Some ulcers of the distal left toes, right TMA well-healed EXT: No peripheral edema VASCULAR: Left thigh AV graft with bruit and thrill  Imaging: DG Chest 1 View  Result Date: 06/30/2019 CLINICAL DATA:  Fall fall today EXAM: CHEST  1 VIEW COMPARISON:  August 02, 2018 FINDINGS: The heart size and mediastinal contours are within normal limits. Aortic knob calcifications. Both lungs are clear. No acute osseous findings. Prior fixation seen in the right shoulder. IMPRESSION: No active disease. Electronically Signed   By: Prudencio Pair M.D.   On: 06/30/2019 04:54   DG Foot Complete  Left  Result Date: 06/30/2019 CLINICAL DATA:  Gangrene of the toes and pain after fall EXAM: LEFT FOOT - COMPLETE 3+ VIEW COMPARISON:  None. FINDINGS: No fracture or dislocation. There is diffuse osteopenia. Erosive type change seen at the distal third and fifth phalanx. Calcaneal enthesophytes are seen. Dense vascular calcifications are noted. IMPRESSION: No acute fracture. Erosive changes with the distal third and fifth phalanx. Electronically Signed   By: Prudencio Pair M.D.   On: 06/30/2019 04:55   DG Hip Unilat W or Wo Pelvis 2-3 Views Right  Result Date: 06/30/2019 CLINICAL DATA:  Fall and hip pain EXAM: DG HIP (WITH OR WITHOUT PELVIS) 2-3V RIGHT COMPARISON:  None. FINDINGS: There is a nondisplaced fracture seen through the greater trochanter with slight impaction and a probable nondisplaced intratrochanteric fracture. The femoral head is still well seated within the acetabulum. There is diffuse osteopenia. Dense vascular calcifications are noted. IMPRESSION: Nondisplaced greater trochanter and intertrochanteric fractures. Electronically Signed   By: Prudencio Pair M.D.   On: 06/30/2019 04:53   DG FEMUR, MIN 2 VIEWS RIGHT  Result Date: 06/30/2019 CLINICAL DATA:  Fall EXAM: RIGHT FEMUR 2 VIEWS COMPARISON:  None. FINDINGS: There is nondisplaced mildly impacted fracture seen through the greater trochanter and intertrochanteric regions. No other fractures are seen. There is diffuse osteopenia. Dense vascular calcifications  are noted. IMPRESSION: Nondisplaced greater trochanter and superior intertrochanteric fracture. Electronically Signed   By: Prudencio Pair M.D.   On: 06/30/2019 04:53    Labs: BMET Recent Labs  Lab 06/30/19 0351  NA 138  K 4.3  CL 99  CO2 23  GLUCOSE 111*  BUN 36*  CREATININE 10.17*  CALCIUM 8.8*   CBC Recent Labs  Lab 06/30/19 0351  WBC 12.7*  NEUTROABS 10.6*  HGB 15.1  HCT 46.7  MCV 66.6*  PLT 253    Medications:    . vitamin C  500 mg Oral Daily  .  atorvastatin  40 mg Oral Daily  . Chlorhexidine Gluconate Cloth  6 each Topical Q0600  . cinacalcet  60 mg Oral QPM  . docusate sodium  100 mg Oral BID  . heparin  5,000 Units Subcutaneous Q8H  . midodrine  10 mg Oral Q M,W,F  . multivitamin  1 tablet Oral QHS  . sevelamer carbonate  3,200 mg Oral TID with meals  . cholecalciferol  1,000 Units Oral Daily      Otelia Santee, MD 07/01/2019, 7:35 AM

## 2019-07-02 DIAGNOSIS — S72001A Fracture of unspecified part of neck of right femur, initial encounter for closed fracture: Secondary | ICD-10-CM

## 2019-07-02 DIAGNOSIS — Z992 Dependence on renal dialysis: Secondary | ICD-10-CM

## 2019-07-02 DIAGNOSIS — E43 Unspecified severe protein-calorie malnutrition: Secondary | ICD-10-CM

## 2019-07-02 DIAGNOSIS — N186 End stage renal disease: Secondary | ICD-10-CM

## 2019-07-02 DIAGNOSIS — I10 Essential (primary) hypertension: Secondary | ICD-10-CM

## 2019-07-02 DIAGNOSIS — W19XXXD Unspecified fall, subsequent encounter: Secondary | ICD-10-CM

## 2019-07-02 DIAGNOSIS — W19XXXA Unspecified fall, initial encounter: Secondary | ICD-10-CM

## 2019-07-02 LAB — BASIC METABOLIC PANEL
Anion gap: 13 (ref 5–15)
BUN: 22 mg/dL (ref 8–23)
CO2: 24 mmol/L (ref 22–32)
Calcium: 9 mg/dL (ref 8.9–10.3)
Chloride: 97 mmol/L — ABNORMAL LOW (ref 98–111)
Creatinine, Ser: 7.52 mg/dL — ABNORMAL HIGH (ref 0.61–1.24)
GFR calc Af Amer: 7 mL/min — ABNORMAL LOW (ref >60)
GFR calc non Af Amer: 6 mL/min — ABNORMAL LOW (ref >60)
Glucose, Bld: 86 mg/dL (ref 70–99)
Potassium: 5.8 mmol/L — ABNORMAL HIGH (ref 3.5–5.1)
Sodium: 134 mmol/L — ABNORMAL LOW (ref 135–145)

## 2019-07-02 LAB — HEPATITIS B E ANTIGEN: Hep B E Ag: NEGATIVE

## 2019-07-02 LAB — SARS CORONAVIRUS 2 (TAT 6-24 HRS): SARS Coronavirus 2: NEGATIVE

## 2019-07-02 LAB — HEPATITIS B E ANTIBODY: Hep B E Ab: NEGATIVE

## 2019-07-02 NOTE — Discharge Summary (Signed)
Physician Discharge Summary  Joseph STOCKMAN Sr. QBH:[id number] DOB: May 13, 1940 DOA: 06/30/2019  PCP: [name], MD  Admit date: 06/30/2019 Discharge date: 07/02/2019  Time spent: 45 minutes  Recommendations for Outpatient Follow-up:  -To be discharged to SNF once bed available for ST rehab following hip fracture that did not require operative repair. -Continue hemodyalisis as scheduled on a MWF schedule. -Follow up with Dr. Sharol Given for the gangrene of left toes as he missed outpatient appointment while hospitalized. -Follow up with PCP in 2-3 weeks.   Discharge Diagnoses:  Principal Problem:   Closed avulsion fracture of right hip ([place]) Active Problems:   ESRD on dialysis ([place])   Non-ischemic cardiomyopathy- EF 35- 45%   CAD- 40-50% LAD at cath 07/11/15   Moderate aortic stenosis   PVD (peripheral vascular disease) ([place])   Essential hypertension   Severe protein-calorie malnutrition ([place])   Closed intertrochanteric fracture of right hip ([place])   Hip fracture [facility])   Fall   Discharge Condition: Stable  Filed Weights   06/30/19 0312 07/01/19 1213 07/01/19 1625  Weight: 72.6 kg 68.3 kg 67.7 kg    History of present illness:  As per Dr. Volanda Napoleon on 3/3: Joseph [place] Sr. is a 79 y.o. 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  Patient states he has been having some loose stools overnight which is not unusual for him.  He woke up last night trying to get to the bathroom and had a mechanical fall and landed on his right hip.  He had significant  pain on the right hip and has had difficulty with walking since then.  No head injury or loss of consciousness reported.  No back or neck pain.  Has a history of end-stage renal disease and is on hemodialysis.  Hospital Course:   Right Hip fx -Discussed case with Dr. Sharol Given: no surgical intervention is necessary. -PT/OT has recommended SNF. TOC team is on board. Will DC once bed is available.  ESRD on HD -Due for HD today. Schedule is MWF. -Nephrology is on board.  PVD -S/p right mid foot amputation. -chronic left toe wounds managed by ortho. -Per Dr. Sharol Given, f/u in office in 2 weeks.  NICM -Stable, compensated.  H/o CVA -Noted  Hyperlipidemia -Continue Lipitor  Moderate Aortic Stenosis -Compensated; no CP/SOB this admission.   Procedures:  None   Consultations:  Orthopedics  Discharge Instructions   Allergies as of 07/02/2019      Reactions   Aspirin Other (See Comments)   Causes internal bleeding  History of ulcers      Medication List    TAKE these medications   atorvastatin 40 MG tablet Commonly known as: LIPITOR Take 40 mg by mouth daily.   b complex-vitamin c-folic acid 0.8 MG Tabs tablet Take 1 tablet by mouth See admin instructions. Take one tablet daily on Tuesdays, Thursdays, Saturdays, and Sundays.   cholecalciferol 25 MCG (1000 UNIT) tablet Commonly known as: VITAMIN D3 Take 1,000 Units by mouth daily.   cinacalcet 60 MG tablet Commonly known as: SENSIPAR Take 60 mg by mouth every evening.   clopidogrel 75 MG tablet  Commonly known as: PLAVIX Take 75 mg by mouth daily.   midodrine 10 MG tablet Commonly known as: PROAMATINE Take 10 mg by mouth every Monday, Wednesday, and Friday. before dialysis.   sevelamer carbonate 800 MG tablet Commonly known as: RENVELA Take 1,600-3,200 mg by mouth See admin instructions. Take 3200  mg by mouth 3 times daily with full meals and take 1600 mg by mouth with snacks.   vitamin C 500 MG  tablet Commonly known as: ASCORBIC ACID Take 500 mg by mouth daily.      Allergies  Allergen Reactions  . Aspirin Other (See Comments)    Causes internal bleeding  History of ulcers   Follow-up Information    [name], MD. Schedule an appointment as soon as possible for a visit.   Specialty: Orthopedic Surgery Why: as soon as possible following DC Contact information: [address] [place] [address] [phone]        [name], MD. Schedule an appointment as soon as possible for a visit in 3 week(s).   Specialty: Internal Medicine Contact information: [place] [address] [phone]        Satira Sark, MD .   Specialty: Cardiology Contact information: [place] [address] [phone]            The results of significant diagnostics from this hospitalization (including imaging, microbiology, ancillary and laboratory) are listed below for reference.    Significant Diagnostic Studies: DG Chest 1 View  Result Date: 06/30/2019 CLINICAL DATA:  Fall fall today EXAM: CHEST  1 VIEW COMPARISON:  August 02, 2018 FINDINGS: The heart size and mediastinal contours are within normal limits. Aortic knob calcifications. Both lungs are clear. No acute osseous findings. Prior fixation seen in the right shoulder. IMPRESSION: No active disease. Electronically Signed   By: Prudencio Pair M.D.   On: 06/30/2019 04:54   DG Foot Complete Left  Result Date: 06/30/2019 CLINICAL DATA:  Gangrene of the toes and pain after fall EXAM: LEFT FOOT - COMPLETE 3+ VIEW COMPARISON:  None. FINDINGS: No fracture or dislocation. There is diffuse osteopenia. Erosive type change seen at the distal third and fifth phalanx. Calcaneal enthesophytes are seen. Dense vascular calcifications are noted. IMPRESSION: No acute fracture. Erosive changes with the distal third and fifth phalanx. Electronically Signed   By: Prudencio Pair M.D.   On:  06/30/2019 04:55   DG Hip Unilat W or Wo Pelvis 2-3 Views Right  Result Date: 06/30/2019 CLINICAL DATA:  Fall and hip pain EXAM: DG HIP (WITH OR WITHOUT PELVIS) 2-3V RIGHT COMPARISON:  None. FINDINGS: There is a nondisplaced fracture seen through the greater trochanter with slight impaction and a probable nondisplaced intratrochanteric fracture. The femoral head is still well seated within the acetabulum. There is diffuse osteopenia. Dense vascular calcifications are noted. IMPRESSION: Nondisplaced greater trochanter and intertrochanteric fractures. Electronically Signed   By: Prudencio Pair M.D.   On: 06/30/2019 04:53   DG FEMUR, MIN 2 VIEWS RIGHT  Result Date: 06/30/2019 CLINICAL DATA:  Fall EXAM: RIGHT FEMUR 2 VIEWS COMPARISON:  None. FINDINGS: There is nondisplaced mildly impacted fracture seen through the greater trochanter and intertrochanteric regions. No other fractures are seen. There is diffuse osteopenia. Dense vascular calcifications are noted. IMPRESSION: Nondisplaced greater trochanter and superior intertrochanteric fracture. Electronically Signed   By: Prudencio Pair M.D.   On: 06/30/2019 04:53    Microbiology: Recent Results (from the past 240 hour(s))  Respiratory Panel  by RT PCR (Flu A&B, Covid) - Nasopharyngeal Swab     Status: None   Collection Time: 06/30/19  5:16 AM   Specimen: Nasopharyngeal Swab  Result Value Ref Range Status   SARS Coronavirus 2 by RT PCR NEGATIVE NEGATIVE Final    Comment: (NOTE) SARS-CoV-2 target nucleic acids are NOT DETECTED. The SARS-CoV-2 RNA is generally detectable in upper respiratoy specimens during the acute phase of infection. The lowest concentration of SARS-CoV-2 viral copies this assay can detect is 131 copies/mL. A negative result does not preclude SARS-Cov-2 infection and should not be used as the sole basis for treatment or other patient management decisions. A negative result may occur with  improper specimen collection/handling,  submission of specimen other than nasopharyngeal swab, presence of viral mutation(s) within the areas targeted by this assay, and inadequate number of viral copies (<131 copies/mL). A negative result must be combined with clinical observations, patient history, and epidemiological information. The expected result is Negative. Fact Sheet for Patients:  PinkCheek.be Fact Sheet for Healthcare Providers:  GravelBags.it This test is not yet ap proved or cleared by the [place] FDA and  has been authorized for detection and/or diagnosis of SARS-CoV-2 by FDA under an Emergency Use Authorization (EUA). This EUA will remain  in effect (meaning this test can be used) for the duration of the COVID-19 declaration under Section 564(b)(1) of the Act, 21 U.S.C. section 360bbb-3(b)(1), unless the authorization is terminated or revoked sooner.    Influenza A by PCR NEGATIVE NEGATIVE Final   Influenza B by PCR NEGATIVE NEGATIVE Final    Comment: (NOTE) The Xpert Xpress SARS-CoV-2/FLU/RSV assay is intended as an aid in  the diagnosis of influenza from Nasopharyngeal swab specimens and  should not be used as a sole basis for treatment. Nasal washings and  aspirates are unacceptable for Xpert Xpress SARS-CoV-2/FLU/RSV  testing. Fact Sheet for Patients: PinkCheek.be Fact Sheet for Healthcare Providers: GravelBags.it This test is not yet approved or cleared by the [place] FDA and  has been authorized for detection and/or diagnosis of SARS-CoV-2 by  FDA under an Emergency Use Authorization (EUA). This EUA will remain  in effect (meaning this test can be used) for the duration of the  Covid-19 declaration under Section 564(b)(1) of the Act, 21  U.S.C. section 360bbb-3(b)(1), unless the authorization is  terminated or revoked. Performed at [facility], [address]  [place], [place] [address]      Labs: Basic Metabolic Panel: Recent Labs  Lab 06/30/19 0351 07/01/19 1220 07/02/19 0534  NA 138 135 134*  K 4.3 5.3* 5.8*  CL 99 95* 97*  CO2 23 23 24   GLUCOSE 111* 129* 86  BUN 36* 46* 22  CREATININE 10.17* 12.57* 7.52*  CALCIUM 8.8* 8.7* 9.0  PHOS  --  6.0*  --    Liver Function Tests: Recent Labs  Lab 07/01/19 1220  ALBUMIN 2.5*   No results for input(s): LIPASE, AMYLASE in the last 168 hours. No results for input(s): AMMONIA in the last 168 hours. CBC: Recent Labs  Lab 06/30/19 0351 07/01/19 1220  WBC 12.7* 13.3*  NEUTROABS 10.6*  --   HGB 15.1 13.6  HCT 46.7 42.1  MCV 66.6* 66.0*  PLT 253 264   Cardiac Enzymes: No results for input(s): CKTOTAL, CKMB, CKMBINDEX, TROPONINI in the last 168 hours. BNP: BNP (last 3 results) No results for input(s): BNP in the last 8760 hours.  ProBNP (last 3 results) No results for input(s): PROBNP in the  last 8760 hours.  CBG: Recent Labs  Lab 07/01/19 0640  GLUCAP 78       Signed:  Lelon Frohlich  [facility] Pager: [phone] 07/02/2019, 9:41 AM

## 2019-07-02 NOTE — Evaluation (Signed)
Occupational Therapy Evaluation Patient Details Name: Joseph BIRCHALL Sr. MRN: [id number] DOB: [birth year] Today's Date: 07/02/2019    History of Present Illness 79 yo admitted with right hip fx with non operative management. PMHx: NICM, ESRD, CAD, PVD, HTN, RT transmet amputation   Clinical Impression   Pt with decline in function and safety with ADLs and ADL mobility with impaired strength, balance and endurance. Pt lives at home alone and was independent with ADLs/selfcare, home mgt, IADLs and used a cane for mobility; drove himself to HD. Pt currently requires min guard A for UB ADLs, max - total A with LB ADLs, min A with toileting, min A with bed mobility and min A with mobility using RW. Pt would benefit from acute OT services to address impairments to maximize level of function and safety    Follow Up Recommendations  SNF    Equipment Recommendations  Other (comment)(TBD at next venue of care)    Recommendations for Other Services       Precautions / Restrictions Precautions Precautions: Fall Restrictions Weight Bearing Restrictions: Yes RLE Weight Bearing: Weight bearing as tolerated      Mobility Bed Mobility Overal bed mobility: Needs Assistance Bed Mobility: Supine to Sit;Sit to Supine     Supine to sit: Min assist Sit to supine: Min assist   General bed mobility comments: min A with R LE  Transfers Overall transfer level: Needs assistance Equipment used: Rolling walker (2 wheeled) Transfers: Sit to/from Stand Sit to Stand: Min assist Stand pivot transfers: Min assist       General transfer comment: cues for hand placement and safety    Balance Overall balance assessment: Needs assistance;History of Falls Sitting-balance support: No upper extremity supported;Feet supported Sitting balance-Leahy Scale: Good Sitting balance - Comments: EOB without assist   Standing balance support: Bilateral upper extremity supported;During functional activity Standing  balance-Leahy Scale: Poor Standing balance comment: bil UE support on RW due to pain.                           ADL either performed or assessed with clinical judgement   ADL Overall ADL's : Needs assistance/impaired Eating/Feeding: Set up;Independent;Sitting   Grooming: Wash/dry hands;Wash/dry face;Set up;Supervision/safety;Sitting   Upper Body Bathing: Min guard;Sitting   Lower Body Bathing: Maximal assistance   Upper Body Dressing : Min guard;Sitting   Lower Body Dressing: Total assistance   Toilet Transfer: Minimal assistance;Ambulation;RW;Stand-pivot;BSC;Cueing for safety   Toileting- Clothing Manipulation and Hygiene: Minimal assistance;Sit to/from stand       Functional mobility during ADLs: Minimal assistance;Rolling walker;Cueing for safety       Vision Baseline Vision/History: Wears glasses Patient Visual Report: No change from baseline       Perception     Praxis      Pertinent Vitals/Pain Pain Assessment: Faces Pain Score: 6  Faces Pain Scale: Hurts even more Pain Location: right hip with mobility Pain Descriptors / Indicators: Aching;Guarding;Sore;Grimacing Pain Intervention(s): Limited activity within patient's tolerance;Monitored during session;Premedicated before session;Repositioned     Hand Dominance Right   Extremity/Trunk Assessment Upper Extremity Assessment Upper Extremity Assessment: Overall WFL for tasks assessed   Lower Extremity Assessment Lower Extremity Assessment: Defer to PT evaluation   Cervical / Trunk Assessment Cervical / Trunk Assessment: Normal   Communication Communication Communication: No difficulties   Cognition Arousal/Alertness: Awake/alert Behavior During Therapy: WFL for tasks assessed/performed Overall Cognitive Status: Within Functional Limits for tasks assessed  General Comments       Exercises General Exercises - Lower Extremity Long Arc  Quad: AROM;Both;Seated;20 reps Hip ABduction/ADduction: AROM;Both;Seated;20 reps Hip Flexion/Marching: AROM;Both;Seated;20 reps   Shoulder Instructions      Home Living Family/patient expects to be discharged to:: Private residence Living Arrangements: Alone   Type of Home: Apartment Home Access: Level entry     Home Layout: One level     Bathroom Shower/Tub: Occupational psychologist: Handicapped height     Home Equipment: Environmental consultant - 2 wheels;Cane - single point          Prior Functioning/Environment Level of Independence: Independent with assistive device(s)        Comments: uses a cane, drives to HD and cares for himself        OT Problem List: Decreased strength;Impaired balance (sitting and/or standing);Pain;Decreased knowledge of use of DME or AE;Decreased activity tolerance      OT Treatment/Interventions: Self-care/ADL training;DME and/or AE instruction;Therapeutic activities;Balance training;Therapeutic exercise;Patient/family education    OT Goals(Current goals can be found in the care plan section) Acute Rehab OT Goals Patient Stated Goal: return home OT Goal Formulation: With patient Time For Goal Achievement: 07/16/19 Potential to Achieve Goals: Good ADL Goals Pt Will Perform Grooming: with min guard assist;standing Pt Will Perform Upper Body Bathing: with supervision;with set-up;sitting Pt Will Perform Lower Body Bathing: with mod assist;sitting/lateral leans;sit to/from stand Pt Will Perform Upper Body Dressing: with supervision;with set-up;sitting Pt Will Transfer to Toilet: with min guard assist;ambulating;stand pivot transfer;bedside commode Pt Will Perform Toileting - Clothing Manipulation and hygiene: with min guard assist;sit to/from stand Additional ADL Goal #1: Pt will complete bed mobility with sup to sit EOB for functional tasks  OT Frequency: Min 2X/week   Barriers to D/C:            Co-evaluation              AM-PAC  OT "6 Clicks" Daily Activity     Outcome Measure Help from another person eating meals?: None Help from another person taking care of personal grooming?: A Little Help from another person toileting, which includes using toliet, bedpan, or urinal?: A Lot Help from another person bathing (including washing, rinsing, drying)?: A Lot Help from another person to put on and taking off regular upper body clothing?: A Little Help from another person to put on and taking off regular lower body clothing?: Total 6 Click Score: 15   End of Session Equipment Utilized During Treatment: Gait belt;Rolling walker;Other (comment)(BSC)  Activity Tolerance: Patient limited by fatigue Patient left: in bed;with call bell/phone within reach;with bed alarm set  OT Visit Diagnosis: Unsteadiness on feet (R26.81);Other abnormalities of gait and mobility (R26.89);Muscle weakness (generalized) (M62.81);History of falling (Z91.81);Pain Pain - Right/Left: Right Pain - part of body: Hip;Leg                Time: 2951-8841 OT Time Calculation (min): 22 min Charges:  OT Evaluation $OT Eval Moderate Complexity: 1 Mod    Britt Bottom 07/02/2019, 2:34 PM

## 2019-07-02 NOTE — Progress Notes (Signed)
Physical Therapy Treatment Patient Details Name: Joseph CANAL Sr. MRN: [id number] DOB: Sep 10, 1940 Today's Date: 07/02/2019    History of Present Illness 79 yo admitted with right hip fx with non operative management. PMHx: NICM, ESRD, CAD, PVD, HTN, RT transmet amputation    PT Comments    Pt very pleasant and reports decreased pain with increased activity tolerance and able to take a few steps. Pt with improved tolerance for HEP and educated for progression. Pt remains limited by pain with activity and lack of tolerance for weight bearing or being able to off weight RLE significantly to progress gait. Will continue to follow.     Follow Up Recommendations  SNF;Supervision/Assistance - 24 hour     Equipment Recommendations  Wheelchair (measurements PT);Wheelchair cushion (measurements PT);3in1 (PT)    Recommendations for Other Services       Precautions / Restrictions Precautions Precautions: Fall Restrictions Weight Bearing Restrictions: Yes RLE Weight Bearing: Weight bearing as tolerated    Mobility  Bed Mobility Overal bed mobility: Modified Independent             General bed mobility comments: HOB 25 degrees with pt able to pivot to left side of bed without physical assist  Transfers Overall transfer level: Needs assistance   Transfers: Sit to/from Stand Sit to Stand: Min assist         General transfer comment: min assist to rise from surface with cues for hand placement and safety  Ambulation/Gait Ambulation/Gait assistance: Min assist Gait Distance (Feet): 6 Feet Assistive device: Rolling walker (2 wheeled) Gait Pattern/deviations: Step-to pattern;Decreased stance time - right   Gait velocity interpretation: <1.31 ft/sec, indicative of household ambulator General Gait Details: pt reliant on bil UE support on RW to step but fatigued quickly due to pain with need for chair pulled to him   Stairs             Wheelchair Mobility    Modified  [place] (Stroke Patients Only)       Balance Overall balance assessment: Needs assistance;History of Falls   Sitting balance-Leahy Scale: Good Sitting balance - Comments: EOB without assist   Standing balance support: Bilateral upper extremity supported Standing balance-Leahy Scale: Poor Standing balance comment: bil UE support on RW due to pain.                            Cognition Arousal/Alertness: Awake/alert Behavior During Therapy: WFL for tasks assessed/performed Overall Cognitive Status: Within Functional Limits for tasks assessed                                        Exercises General Exercises - Lower Extremity Long Arc Quad: AROM;Both;Seated;20 reps Hip ABduction/ADduction: AROM;Both;Seated;20 reps Hip Flexion/Marching: AROM;Both;Seated;20 reps    General Comments        Pertinent Vitals/Pain Pain Score: 6  Pain Location: right hip in standing Pain Descriptors / Indicators: Aching;Guarding;Throbbing;Constant Pain Intervention(s): Limited activity within patient's tolerance;Monitored during session;Premedicated before session;Repositioned    Home Living                      Prior Function            PT Goals (current goals can now be found in the care plan section) Progress towards PT goals: Progressing toward goals    Frequency  Min 3X/week      PT Plan Current plan remains appropriate    Co-evaluation              AM-PAC PT "6 Clicks" Mobility   Outcome Measure  Help needed turning from your back to your side while in a flat bed without using bedrails?: A Little Help needed moving from lying on your back to sitting on the side of a flat bed without using bedrails?: A Little Help needed moving to and from a bed to a chair (including a wheelchair)?: A Little Help needed standing up from a chair using your arms (e.g., wheelchair or bedside chair)?: A Little Help needed to walk in hospital room?: A  Little Help needed climbing 3-5 steps with a railing? : Total 6 Click Score: 16    End of Session Equipment Utilized During Treatment: Gait belt Activity Tolerance: Patient limited by pain Patient left: in chair;with call bell/phone within reach;with chair alarm set Nurse Communication: Mobility status PT Visit Diagnosis: Other abnormalities of gait and mobility (R26.89);Difficulty in walking, not elsewhere classified (R26.2);Pain     Time: 6394-3200 PT Time Calculation (min) (ACUTE ONLY): 11 min  Charges:  $Therapeutic Activity: 8-22 mins                     Maayan Jenning P, PT Acute Rehabilitation Services Pager: [phone] Office: [place] 07/02/2019, 1:10 PM

## 2019-07-02 NOTE — NC FL2 (Signed)
[place] LEVEL OF CARE SCREENING TOOL     IDENTIFICATION  Patient Name: [name]. Birthdate: 09/22/1940 Sex: male Admission Date (Current Location): 06/30/2019  [facility] and [place] Number:  Herbalist and Address:  [facility] [place]. [facility], [place] [address], [place], [place] [address]      Provider Number: [id number]  Attending Physician Name and Address:  Isaac Bliss, [place]  Relative Name and Phone Number:  Hortense Ramal [phone]    Current Level of Care: SNF Recommended Level of Care: [place] Prior Approval Number:    Date Approved/Denied:   PASRR Number: [id number]  Discharge Plan: SNF    Current Diagnoses: Patient Active Problem List   Diagnosis Date Noted  . Fall   . Closed avulsion fracture of right hip ([place]) 06/30/2019  . Closed intertrochanteric fracture of right hip ([place]) 06/30/2019  . Hip fracture ([place]) 06/30/2019  . Severe protein-calorie malnutrition ([place]) 06/18/2018  . Severe sepsis ([place]) 06/12/2018  . Leukocytosis 06/12/2018  . MRSA (methicillin resistant staph aureus) culture positive 06/12/2018  . Essential hypertension 06/12/2018  . ESRD (end stage renal disease) ([place]) 06/09/2018  . Gangrene of finger ([place]) 06/04/2018  . S/P transmetatarsal amputation of foot, right ([place]) 05/09/2017  . PVD (peripheral vascular disease) ([place]) 04/15/2017  . Nonischemic cardiomyopathy ([place]) 08/15/2015  . Aortic stenosis 08/15/2015  . Moderate aortic stenosis 07/12/2015  . 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 CAD- 40-50% LAD at cath 07/11/15 07/11/2015  . Aspirin intolerance 07/11/2015  . Abnormal stress test   . ESRD on dialysis ([place]) 05/30/2015  . CTS (carpal tunnel syndrome) 02/28/2015  . PVD of LE - Dr Oneida Alar follows 08/20/2012  . Midfoot skin ulcer, right, limited to breakdown of skin ([place]) 01/16/2012  . Encounter for screening colonoscopy 10/08/2011  . Melena 10/08/2011  .  Other complications due to renal dialysis device, implant, and graft 09/24/2011    Orientation RESPIRATION BLADDER Height & Weight     Self, Time, Situation, Place  Normal Continent, External catheter Weight: 149 lb 4 oz (67.7 kg) Height:  5\' 5"  (165.1 cm)  BEHAVIORAL SYMPTOMS/MOOD NEUROLOGICAL BOWEL NUTRITION STATUS      Continent Diet(see discharge summary)  AMBULATORY STATUS COMMUNICATION OF NEEDS Skin   Extensive Assist Verbally Normal                       Personal Care Assistance Level of Assistance  Bathing, Dressing, Feeding Bathing Assistance: Maximum assistance Feeding assistance: Limited assistance Dressing Assistance: Maximum assistance     Functional Limitations Info             SPECIAL CARE FACTORS FREQUENCY  PT (By licensed PT), OT (By licensed OT)     PT Frequency: 5 times a week OT Frequency: 5 times a week            Contractures      Additional Factors Info  Allergies   Allergies Info: Aspirin           Current Medications (07/02/2019):  This is the current hospital active medication list Current Facility-Administered Medications  Medication Dose Route Frequency Provider Last Rate Last Admin  . 0.9 %  sodium chloride infusion  100 mL Intravenous PRN Dwana Melena, MD      . 0.9 %  sodium chloride infusion  100 mL Intravenous PRN Dwana Melena, MD      . alteplase (CATHFLO ACTIVASE) injection 2  mg  2 mg Intracatheter Once PRN Dwana Melena, MD      . ascorbic acid (VITAMIN C) tablet 500 mg  500 mg Oral Daily Lynetta Mare, MD   500 mg at 07/02/19 0920  . atorvastatin (LIPITOR) tablet 40 mg  40 mg Oral Daily Lynetta Mare, MD   40 mg at 07/02/19 0920  . bisacodyl (DULCOLAX) suppository 10 mg  10 mg Rectal Daily PRN Lynetta Mare, MD      . Chlorhexidine Gluconate Cloth 2 % PADS 6 each  6 each Topical Q0600 Dwana Melena, MD   6 each at 07/01/19 [phone]  . cinacalcet (SENSIPAR) tablet 60 mg  60 mg Oral QPM Lynetta Mare, MD   60  mg at 07/01/19 2028  . docusate sodium (COLACE) capsule 100 mg  100 mg Oral BID Lynetta Mare, MD   100 mg at 07/01/19 2027  . feeding supplement (PRO-STAT SUGAR FREE 64) liquid 30 mL  30 mL Oral BID Isaac Bliss, Rayford Halsted, MD   30 mL at 07/02/19 0920  . heparin injection 1,000 Units  1,000 Units Dialysis PRN Dwana Melena, MD      . heparin injection 5,000 Units  5,000 Units Subcutaneous Q8H Lynetta Mare, MD   5,000 Units at 07/02/19 1312  . HYDROcodone-acetaminophen (NORCO/VICODIN) 5-325 MG per tablet 1-2 tablet  1-2 tablet Oral Q6H PRN Lynetta Mare, MD   2 tablet at 07/02/19 3614  . lidocaine (PF) (XYLOCAINE) 1 % injection 5 mL  5 mL Intradermal PRN Dwana Melena, MD      . lidocaine-prilocaine (EMLA) cream 1 application  1 application Topical PRN Dwana Melena, MD      . methocarbamol (ROBAXIN) tablet 500 mg  500 mg Oral Q6H PRN Lynetta Mare, MD       Or  . methocarbamol (ROBAXIN) 500 mg in dextrose 5 % 50 mL IVPB  500 mg Intravenous Q6H PRN Lynetta Mare, MD      . midodrine (PROAMATINE) tablet 10 mg  10 mg Oral Q M,W,F Lynetta Mare, MD   10 mg at 07/02/19 1009  . morphine 2 MG/ML injection 0.5 mg  0.5 mg Intravenous Q2H PRN Lynetta Mare, MD      . multivitamin (RENA-VIT) tablet 1 tablet  1 tablet Oral QHS Lynetta Mare, MD   1 tablet at 07/01/19 2027  . pentafluoroprop-tetrafluoroeth (GEBAUERS) aerosol 1 application  1 application Topical PRN Dwana Melena, MD      . polyethylene glycol (MIRALAX / GLYCOLAX) packet 17 g  17 g Oral Daily PRN Lynetta Mare, MD      . sevelamer carbonate (RENVELA) tablet 3,200 mg  3,200 mg Oral TID with meals Lynetta Mare, MD   3,200 mg at 07/02/19 1311  . Vitamin D3 (Vitamin D) tablet 1,000 Units  1,000 Units Oral Daily Lynetta Mare, MD   1,000 Units at 07/02/19 0919     Discharge Medications: Please see discharge summary for a list of discharge medications.  Relevant Imaging Results:  Relevant Lab  Results:   Additional Information SS [id number]  Atilano Median, LCSW

## 2019-07-02 NOTE — Plan of Care (Signed)

## 2019-07-02 NOTE — Plan of Care (Signed)
  Problem: Education: Goal: Knowledge of General Education information will improve Description: Including pain rating scale, medication(s)/side effects and non-pharmacologic comfort measures 07/02/2019 0259 by Venia Minks, RN Outcome: Not Progressing 07/01/2019 2121 by Venia Minks, RN Outcome: Not Progressing   Problem: Health Behavior/Discharge Planning: Goal: Ability to manage health-related needs will improve 07/02/2019 0259 by Venia Minks, RN Outcome: Not Progressing 07/01/2019 2121 by Venia Minks, RN Outcome: Not Progressing   Problem: Clinical Measurements: Goal: Ability to maintain clinical measurements within normal limits will improve 07/02/2019 0259 by Venia Minks, RN Outcome: Not Progressing 07/01/2019 2121 by Venia Minks, RN Outcome: Not Progressing Goal: Will remain free from infection 07/02/2019 0259 by Venia Minks, RN Outcome: Not Progressing 07/01/2019 2121 by Venia Minks, RN Outcome: Not Progressing Goal: Diagnostic test results will improve 07/02/2019 0259 by Venia Minks, RN Outcome: Not Progressing 07/01/2019 2121 by Venia Minks, RN Outcome: Not Progressing Goal: Respiratory complications will improve 07/02/2019 0259 by Venia Minks, RN Outcome: Not Progressing 07/01/2019 2121 by Venia Minks, RN Outcome: Not Progressing Goal: Cardiovascular complication will be avoided 07/02/2019 0259 by Venia Minks, RN Outcome: Not Progressing 07/01/2019 2121 by Venia Minks, RN Outcome: Not Progressing   Problem: Activity: Goal: Risk for activity intolerance will decrease 07/02/2019 0259 by Venia Minks, RN Outcome: Not Progressing 07/01/2019 2121 by Venia Minks, RN Outcome: Not Progressing   Problem: Nutrition: Goal: Adequate nutrition will be maintained 07/02/2019 0259 by Venia Minks, RN Outcome: Not Progressing 07/01/2019 2121 by Venia Minks, RN Outcome: Not Progressing   Problem: Coping: Goal: Level  of anxiety will decrease 07/02/2019 0259 by Venia Minks, RN Outcome: Not Progressing 07/01/2019 2121 by Venia Minks, RN Outcome: Not Progressing   Problem: Elimination: Goal: Will not experience complications related to bowel motility 07/02/2019 0259 by Venia Minks, RN Outcome: Not Progressing 07/01/2019 2121 by Venia Minks, RN Outcome: Not Progressing Goal: Will not experience complications related to urinary retention 07/02/2019 0259 by Venia Minks, RN Outcome: Not Progressing 07/01/2019 2121 by Venia Minks, RN Outcome: Not Progressing   Problem: Pain Managment: Goal: General experience of comfort will improve 07/02/2019 0259 by Venia Minks, RN Outcome: Not Progressing 07/01/2019 2121 by Venia Minks, RN Outcome: Not Progressing   Problem: Safety: Goal: Ability to remain free from injury will improve 07/02/2019 0259 by Venia Minks, RN Outcome: Not Progressing 07/01/2019 2121 by Venia Minks, RN Outcome: Not Progressing   Problem: Skin Integrity: Goal: Risk for impaired skin integrity will decrease 07/02/2019 0259 by Venia Minks, RN Outcome: Not Progressing 07/01/2019 2121 by Venia Minks, RN Outcome: Not Progressing

## 2019-07-02 NOTE — Consult Note (Signed)
   [facility] CM Inpatient Consult   07/02/2019  [name] Sr. Apr 15, 1941 [id number]    Patient screened for 3 hospitalizations in the past 6 months  [employer] [ACO] with [facility].  Patient has a distant history with [place] with EMMI program.    Review of patient's medical record from MD progress notes which includes but not limited to and reveals:  HPI: [name] is a 79 y.o. 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   Currently, patient is being recommended for a skilled rehab transition for right hip fracture due to a fall as patient lives alone per PT and OT evaluations noted.   Primary Care Provider is [name], MD   Plan:  Will follow up for disposition and alert [facility] RN PAC of disposition if, he transitions to a [facility] affiliated facility for short term rehab needs.     Please place a [facility] Care Management consult as appropriate and for questions contact:   Natividad Brood, RN BSN [facility] Liaison  [phone] business mobile phone Toll free office [phone]  Fax number: [phone] [email or web address]

## 2019-07-02 NOTE — Consult Note (Signed)
ORTHOPAEDIC CONSULTATION  REQUESTING PHYSICIAN: Isaac Bliss, Olam Idler*  Chief Complaint: Right hip pain status post fall with avulsion of the greater trochanter right hip  HPI: [name] Sr. is a 79 y.o. male with peripheral vascular disease end-stage renal disease on dialysis who is status post fall on his right hip patient sustained a avulsion fracture of the greater trochanter.  Patient is status post right transmetatarsal amputation with ischemic changes to both feet.  Past Medical History:  Diagnosis Date  . Anxiety   . Aortic stenosis   . Arthritis    left hand  . CVA (cerebral infarction)   . ESRD (end stage renal disease) on dialysis [facility])    M/W/F at [facility] in [place]  . Essential hypertension    resolved with HD  . Gangrene of right foot ([place])   . Gastric ulcer 2004  . GI bleed    gastric ulcer  . Heart murmur   . History of blood transfusion   . History of cardiomyopathy    LVEF normal as of February 2017  . History of gastric ulcer   . History of stroke    Left side weakness  . Iron deficiency anemia   . Peripheral vascular disease ([place])   . Stroke [facility]) 2000   limp left   Past Surgical History:  Procedure Laterality Date  . ABDOMINAL AORTAGRAM N/A 01/24/2012   Procedure: ABDOMINAL Maxcine Ham;  Surgeon: Elam Dutch, MD;  Location: [facility] CATH LAB;  Service: Cardiovascular;  Laterality: N/A;  . ABDOMINAL AORTOGRAM W/LOWER EXTREMITY N/A 04/15/2017   Procedure: ABDOMINAL AORTOGRAM W/LOWER EXTREMITY;  Surgeon: Angelia Mould, MD;  Location: [place] CV LAB;  Service: Cardiovascular;  Laterality: N/A;  . AMPUTATION Right 05/09/2017   Procedure: RIGHT TRANSMETATARSAL AMPUTATION;  Surgeon: [name], MD;  Location: [place];  Service: Orthopedics;  Laterality: Right;  . AMPUTATION Right 06/13/2018   Procedure: AMPUTATION RIGHT LONG FINGER;  Surgeon: Dayna Barker, MD;  Location: [place];  Service: Plastics;  Laterality: Right;  . ARTERIOVENOUS  GRAFT PLACEMENT Right right arm  . AV FISTULA PLACEMENT Left 08/31/2015   Procedure: ARTERIOVENOUS (AV) FISTULA CREATION- LEFT RADIOCEPHALIC;  Surgeon: Mal Misty, MD;  Location: [place];  Service: Vascular;  Laterality: Left;  . AV FISTULA PLACEMENT Right 02/18/2017   Procedure: INSERTION OF ARTERIOVENOUS (AV) GORE-TEX GRAFT  RIGHT UPPER ARM;  Surgeon: Conrad [place], MD;  Location: [place];  Service: Vascular;  Laterality: Right;  . AV FISTULA PLACEMENT Left 12/01/2018   Procedure: INSERTION OF ARTERIOVENOUS (AV) GORE-TEX GRAFT ARM;  Surgeon: Angelia Mould, MD;  Location: [place];  Service: Vascular;  Laterality: Left;  . AV FISTULA PLACEMENT Left 02/09/2019   Procedure: INSERTION OF ARTERIOVENOUS (AV) GORE-TEX GRAFT LEFT THIGH;  Surgeon: Waynetta Sandy, MD;  Location: [place];  Service: Vascular;  Laterality: Left;  . BASCILIC VEIN TRANSPOSITION Left 12/19/2015   Procedure: FIRST STAGE BRACHIAL VEIN TRANSPOSITION;  Surgeon: Conrad [place], MD;  Location: [place];  Service: Vascular;  Laterality: Left;  . BASCILIC VEIN TRANSPOSITION Left 02/08/2016   Procedure: SECOND STAGE BRACHIAL VEIN TRANSPOSITION;  Surgeon: Conrad [place], MD;  Location: [place];  Service: Vascular;  Laterality: Left;  . CARDIAC CATHETERIZATION N/A 07/11/2015   Procedure: Left Heart Cath and Coronary Angiography;  Surgeon: Troy Sine, MD;  Location: [place] CV LAB;  Service: Cardiovascular;  Laterality: N/A;  . Carpel Tunnel Left Dec. 22, 2016  . CHOLECYSTECTOMY    .  COLONOSCOPY  2004   Dr. Irving Shows, left sided diverticula and cecal polyp, path unknown  . COLONOSCOPY  10/29/2011   Procedure: COLONOSCOPY;  Surgeon: Daneil Dolin, MD;  [facility];  Service: Endoscopy;  Laterality: N/A;  10:15  . ESOPHAGOGASTRODUODENOSCOPY  11/2002   Dr. Gala Romney, erosive reflux esophagitis, multiple gastric ulcer and antral/bulbar erosions. Serologies positive for H.Pylori and was treated  . ESOPHAGOGASTRODUODENOSCOPY   11/20014   Dr. Gala Romney, small hh only, ulcers healed  . ESOPHAGOGASTRODUODENOSCOPY  09/21/2011   Dr Trevor Iha [facility], antral erosions, ?early GAVE  . FISTULOGRAM Left 12/10/2016   Procedure: THROMBECTOMY OF LEFT ARM ARTERIOVENOUS FISTULA;  Surgeon: Waynetta Sandy, MD;  Location: [place];  Service: Vascular;  Laterality: Left;  . INSERTION OF DIALYSIS CATHETER Left 12/10/2016   Procedure: INSERTION OF TUNNELED DIALYSIS CATHETER;  Surgeon: Waynetta Sandy, MD;  Location: [place];  Service: Vascular;  Laterality: Left;  . INSERTION OF DIALYSIS CATHETER Right 06/09/2018   Procedure: INSERTION OF DIALYSIS CATHETER Right subclavian;  Surgeon: Angelia Mould, MD;  Location: [place];  Service: Vascular;  Laterality: Right;  . IR DIALY SHUNT INTRO [place] W/IMG RIGHT Right 01/01/2018  . IR GENERIC HISTORICAL  07/16/2016   IR REMOVAL TUN CV CATH W/O FL 07/16/2016 Saverio Danker, PA-C [facility]-INTERV RAD  . IR PTA ADDL CENTRAL DIALYSIS SEG THRU DIALY CIRCUIT RIGHT Right 10/21/2017  . IR REMOVAL TUN CV CATH W/O FL  05/12/2017  . IR THROMBECTOMY AV FISTULA W/THROMBOLYSIS/PTA INC/SHUNT/IMG RIGHT Right 10/21/2017  . IR THROMBECTOMY AV FISTULA W/THROMBOLYSIS/PTA INC/SHUNT/IMG RIGHT Right 11/27/2017  . IR US GUIDE VASC ACCESS RIGHT  10/21/2017  . IR US GUIDE VASC ACCESS RIGHT  11/27/2017  . IR US GUIDE VASC ACCESS RIGHT  01/01/2018  . LIGATION ARTERIOVENOUS GORTEX GRAFT Right 06/09/2018  . LIGATION ARTERIOVENOUS GORTEX GRAFT Right 06/09/2018   Procedure: LIGATION ARTERIOVENOUS GORTEX GRAFT RIGHT ARM;  Surgeon: Angelia Mould, MD;  Location: [place];  Service: Vascular;  Laterality: Right;  . LIGATION ARTERIOVENOUS GORTEX GRAFT Left 12/31/2018   Procedure: LIGATION OF LEFT UPPER ARM ARTERIVENOUS GORTEX GRAFT, LEFT BRACHIAL ARTERY ENDARTERECTOMY WITH BOVINE PATCH ANGIOPLASTY;  Surgeon: Waynetta Sandy, MD;  Location: [place];  Service: Vascular;  Laterality: Left;  . LIGATION OF  ARTERIOVENOUS  FISTULA Left 12/19/2015   Procedure: LIGATION OF RADIOCEPHALIC ARTERIOVENOUS  FISTULA;  Surgeon: Conrad Henry, MD;  Location: [place];  Service: Vascular;  Laterality: Left;  Marland Kitchen MASS EXCISION Right 02/18/2017   Procedure: EXCISION OF RIGHT AXILLARY EPIDERMAL INCLUSION CYST;  Surgeon: Conrad [place], MD;  Location: [place];  Service: Vascular;  Laterality: Right;  . PERIPHERAL VASCULAR CATHETERIZATION N/A 12/14/2015   Procedure: Fistulagram;  Surgeon: Conrad [place], MD;  Location: [place] CV LAB;  Service: Cardiovascular;  Laterality: N/A;  . SHOULDER SURGERY Right    fracture  . ULTRASOUND GUIDANCE FOR VASCULAR ACCESS  04/15/2017   Procedure: Ultrasound Guidance For Vascular Access;  Surgeon: Angelia Mould, MD;  Location: [place] CV LAB;  Service: Cardiovascular;;  . UPPER EXTREMITY VENOGRAPHY Bilateral 12/17/2016   Procedure: Bilateral Upper Extremity Venography;  Surgeon: Serafina Mitchell, MD;  Location: [place] CV LAB;  Service: Cardiovascular;  Laterality: Bilateral;  . UPPER EXTREMITY VENOGRAPHY Left 11/03/2018   Procedure: UPPER EXTREMITY VENOGRAPHY;  Surgeon: Serafina [place], MD;  Location: [place] CV LAB;  Service: Cardiovascular;  Laterality: Left;   Social History   Socioeconomic History  . Marital status:  Single    Spouse name: Not on file  . Number of children: 2  . Years of education: Not on file  . Highest education level: Not on file  Occupational History  . Occupation: retired, Licensed conveyancer    Employer: RETIRED  Tobacco Use  . Smoking status: Former Smoker    Years: 25.00    Quit date: 04/29/2004    Years since quitting: 15.1  . Smokeless tobacco: Former Systems developer    Types: Chew    Quit date: 01/16/1987  . Tobacco comment: quit 2006  Substance and Sexual Activity  . Alcohol use: No  . Drug use: No  . Sexual activity: Not on file  Other Topics Concern  . Not on file  Social History Narrative   Lives alone   Daughter 20-min away   Caffeine  use: 32oz soda per day   Social Determinants of Health   Financial Resource Strain:   . Difficulty of Paying Living Expenses: Not on file  Food Insecurity:   . Worried About Charity fundraiser in the Last Year: Not on file  . Ran Out of Food in the Last Year: Not on file  Transportation Needs:   . Lack of Transportation (Medical): Not on file  . Lack of Transportation (Non-Medical): Not on file  Physical Activity:   . Days of Exercise per Week: Not on file  . Minutes of Exercise per Session: Not on file  Stress:   . Feeling of Stress : Not on file  Social Connections:   . Frequency of Communication with Friends and Family: Not on file  . Frequency of Social Gatherings with Friends and Family: Not on file  . Attends Religious Services: Not on file  . Active Member of Clubs or Organizations: Not on file  . Attends Archivist Meetings: Not on file  . Marital Status: Not on file   Family History  Problem Relation Age of Onset  . Hypertension Mother   . Colon cancer Neg Hx   . Liver disease Neg Hx    - negative except otherwise stated in the family history section Allergies  Allergen Reactions  . Aspirin Other (See Comments)    Causes internal bleeding  History of ulcers   Prior to Admission medications   Medication Sig Start Date End Date Taking? Authorizing Provider  atorvastatin (LIPITOR) 40 MG tablet Take 40 mg by mouth daily. 11/03/15  Yes [provider]  b complex-vitamin c-folic acid (NEPHRO-VITE) 0.8 MG TABS Take 1 tablet by mouth See admin instructions. Take one tablet daily on Tuesdays, Thursdays, Saturdays, and Sundays.   Yes [provider]  cholecalciferol (VITAMIN D3) 25 MCG (1000 UT) tablet Take 1,000 Units by mouth daily.   Yes [provider]  cinacalcet (SENSIPAR) 60 MG tablet Take 60 mg by mouth every evening.    Yes [provider]  clopidogrel (PLAVIX) 75 MG tablet Take 75 mg by mouth daily. 05/24/19  Yes  [provider]  midodrine (PROAMATINE) 10 MG tablet Take 10 mg by mouth every Monday, Wednesday, and Friday. before dialysis. 09/30/18  Yes [provider]  sevelamer (RENVELA) 800 MG tablet Take 1,600-3,200 mg by mouth See admin instructions. Take 3200  mg by mouth 3 times daily with full meals and take 1600 mg by mouth with snacks.   Yes [provider]  vitamin C (ASCORBIC ACID) 500 MG tablet Take 500 mg by mouth daily.   Yes [provider]  No results found. - pertinent xrays, CT, MRI studies were reviewed and independently interpreted  Positive ROS: All other systems have been reviewed and were otherwise negative with the exception of those mentioned in the HPI and as above.  Physical Exam: General: Alert, no acute distress Psychiatric: Patient is competent for consent with normal mood and affect Lymphatic: No axillary or cervical lymphadenopathy Cardiovascular: No pedal edema Respiratory: No cyanosis, no use of accessory musculature GI: No organomegaly, abdomen is soft and non-tender    Images:  @ENCIMAGES @  Labs:  Lab Results  Component Value Date   REPTSTATUS 06/17/2018 FINAL 06/12/2018   GRAMSTAIN  02/18/2017    RARE WBC PRESENT, PREDOMINANTLY PMN FEW GRAM POSITIVE COCCI    CULT  06/12/2018    NO GROWTH 5 DAYS Performed at [facility], [address], [place], [place] [address]     Lab Results  Component Value Date   ALBUMIN 2.5 (L) 07/01/2019   ALBUMIN 3.7 05/28/2019   ALBUMIN 2.8 (L) 12/02/2018    Neurologic: Patient does not have protective sensation bilateral lower extremities.   MUSCULOSKELETAL:   Skin: Examination patient skin is intact over the hip examination of both feet he has ischemic changes to both feet but there is no open wounds no cellulitis no drainage no signs of infection both feet the skin is thin and atrophic.  Examination of the right hip there is no pain with passive range of motion of the right  hip  Review of the radiographs shows a nondisplaced evulsion of the greater trochanter right hip the femoral neck and intertrochanteric region are intact.  Albumin 2.5 with severe protein caloric malnutrition.  Assessment: Assessment: Nondisplaced avulsion fracture greater trochanter right hip, with ischemic changes to both feet but no open ulcers no signs of infection.  Plan: Plan: Patient's hip does not require surgical intervention continue physical therapy progressive weightbearing ambulation as tolerated.  I will follow-up in the office in 2 weeks.  Thank you for the consult and the opportunity to see Mr. Coal Nearhood, [place] [phone] 7:09 AM

## 2019-07-02 NOTE — Progress Notes (Signed)
  [facility] Progress Note   79 year old male admitted following a fall Tuesday found to have a right  nondisplaced greater trochanter and superior intertrochanteric fracture.  He has ESRD on HD at [facility] on [address], MWF using a left thigh AV graft. Work-up of his fall identified right sided. Patient's last dialysis was on 3/1.   Other past history includes history of CVA, PVD, PUD, right foot TMA, ulcers of the left toes (followed by Dr. Sharol Given).  Assessment/ Plan:   1. ESRD L Thigh AVG, [facility]  MWF. No outpt records; labs stable -> HD on Thur  - Will plan for HD today to get back on schedule; 1K bath for 1st hour.  2. R hip fracture (nondisplaced avulsion fx GT rt hip) - rx is PT with progressive weightbearing per ortho w/ 2week office f/u 3. Anemia, Hb stable not an issue 4. 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 PVD 6. Hx/o CVA  Subjective:   Pain fairly controlled; denies f/c/n/v/dyspnea/cp   Objective:   BP 119/66 (BP Location: Right Arm)   Pulse 92   Temp 98.3 F (36.8 C) (Oral)   Resp 17   Ht 5\' 5"  (1.651 m)   Wt 67.7 kg   SpO2 95%   BMI 24.84 kg/m   Intake/Output Summary (Last 24 hours) at 07/02/2019 0716 Last data filed at 07/01/2019 1625 Gross per 24 hour  Intake --  Output 1307 ml  Net -1307 ml   Weight change:   Physical Exam: GEN:NAD QP:RFFMBWG, normal S1 and S2, no rub PULM:Clear bilaterally, normal work of breathing YKZ:LDJT, nontender SKIN:Some ulcers of the distal left toes, right TMA well-healed EXT:No peripheral edema VASCULAR: Left thigh AV graft with bruit and thrill  Imaging: No results found.  Labs: BMET Recent Labs  Lab 06/30/19 0351 07/01/19 1220  NA 138 135  K 4.3 5.3*  CL 99 95*  CO2 23 23  GLUCOSE 111* 129*  BUN 36* 46*  CREATININE 10.17* 12.57*  CALCIUM 8.8* 8.7*  PHOS  --  6.0*   CBC Recent Labs  Lab 06/30/19 0351 07/01/19 1220  WBC 12.7* 13.3*  NEUTROABS 10.6*  --    HGB 15.1 13.6  HCT 46.7 42.1  MCV 66.6* 66.0*  PLT 253 264    Medications:    . vitamin C  500 mg Oral Daily  . atorvastatin  40 mg Oral Daily  . Chlorhexidine Gluconate Cloth  6 each Topical Q0600  . cinacalcet  60 mg Oral QPM  . docusate sodium  100 mg Oral BID  . feeding supplement (PRO-STAT SUGAR FREE 64)  30 mL Oral BID  . heparin  5,000 Units Subcutaneous Q8H  . midodrine  10 mg Oral Q M,W,F  . multivitamin  1 tablet Oral QHS  . sevelamer carbonate  3,200 mg Oral TID with meals  . cholecalciferol  1,000 Units Oral Daily      Otelia Santee, MD 07/02/2019, 7:16 AM

## 2019-07-02 NOTE — TOC Initial Note (Signed)
Transition of Care (TOC) - Initial/Assessment Note    Patient Details  Name: Joseph CAUBLE Sr. MRN: [id number] Date of Birth: [birth year]  Transition of Care [facility]) CM/SW Contact:    Atilano Median, LCSW Phone Number: 07/02/2019, 2:39 PM  Clinical Narrative:                 Admitted for mechanical fall and sustained injury to the right hip. CSW spoke with patient to discuss dispo plan. PT recommending SNF. Patient is in agreement with this as he states he needs extra assistance as he lives at home alone.   Patient currently gets outpatient HD at [facility] in [place], [place] on [address] Patient states that he would prefer to go to the SNF by the hospital in [place] as they can provide transport to and from HD.   CSW given permission to send patient's information out for review.   TOC will continue to follow. Will need updated COVID.   Expected Discharge Plan: Skilled Nursing Facility Barriers to Discharge: Continued Medical Work up   Patient Goals and CMS Choice Patient states their goals for this hospitalization and ongoing recovery are:: get stronger so I can return home CMS Medicare.gov Compare Post Acute Care list provided to:: Patient Choice offered to / list presented to : Patient  Expected Discharge Plan and Services Expected Discharge Plan: [place] In-house Referral: Clinical Social Work   Post Acute Care Choice: [place] Living arrangements for the past 2 months: [place] Expected Discharge Date: 07/02/19                                    Prior Living Arrangements/Services Living arrangements for the past 2 months: Single Family Home Lives with:: Self   Do you feel safe going back to the place where you live?: No   not until he is stronger  Need for Family Participation in Patient Care: Yes (Comment) Care giver support system in place?: Yes (comment)      Activities of Daily Living Home Assistive  Devices/Equipment: None ADL Screening (condition at time of admission) Patient's cognitive ability adequate to safely complete daily activities?: Yes Is the patient deaf or have difficulty hearing?: No Does the patient have difficulty seeing, even when wearing glasses/contacts?: No Does the patient have difficulty concentrating, remembering, or making decisions?: No Patient able to express need for assistance with ADLs?: Yes Does the patient have difficulty dressing or bathing?: No Independently performs ADLs?: Yes (appropriate for developmental age) Does the patient have difficulty walking or climbing stairs?: No Weakness of Legs: None Weakness of Arms/Hands: None  Permission Sought/Granted                  Emotional Assessment              Admission diagnosis:  Hip fracture ([place]) [S72.009A] Fall [W19.XXXA] Closed intertrochanteric fracture of right hip ([place]) [S72.141A] Closed nondisplaced intertrochanteric fracture of right femur, initial encounter ([place]) [S72.144A] Fall, initial encounter [W19.XXXA] Patient Active Problem List   Diagnosis Date Noted  . Fall   . Closed avulsion fracture of right hip ([place]) 06/30/2019  . Closed intertrochanteric fracture of right hip ([place]) 06/30/2019  . Hip fracture ([place]) 06/30/2019  . Severe protein-calorie malnutrition ([place]) 06/18/2018  . Severe sepsis ([place]) 06/12/2018  . Leukocytosis 06/12/2018  . MRSA (methicillin resistant staph aureus) culture positive 06/12/2018  . Essential  hypertension 06/12/2018  . ESRD (end stage renal disease) ([place]) 06/09/2018  . Gangrene of finger ([place]) 06/04/2018  . S/P transmetatarsal amputation of foot, right ([place]) 05/09/2017  . PVD (peripheral vascular disease) ([place]) 04/15/2017  . Nonischemic cardiomyopathy ([place]) 08/15/2015  . Aortic stenosis 08/15/2015  . Moderate aortic stenosis 07/12/2015  . 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 CAD- 40-50% LAD at cath 07/11/15 07/11/2015  .  Aspirin intolerance 07/11/2015  . Abnormal stress test   . ESRD on dialysis ([place]) 05/30/2015  . CTS (carpal tunnel syndrome) 02/28/2015  . PVD of LE - Dr Oneida Alar follows 08/20/2012  . Midfoot skin ulcer, right, limited to breakdown of skin ([place]) 01/16/2012  . Encounter for screening colonoscopy 10/08/2011  . Melena 10/08/2011  . Other complications due to renal dialysis device, implant, and graft 09/24/2011   PCP:  [name], MD Pharmacy:   [facility], [address] AT [address] [place] [address] Phone: [phone] Fax: [phone]  [facility] (ESRD Bundle Only) - [place], [address] [place] [address] Phone: [phone] Fax: [phone]     Social Determinants of Health (SDOH) Interventions    Readmission Risk Interventions No flowsheet data found.

## 2019-07-03 NOTE — Progress Notes (Signed)
  [name] Progress Note   79 year old male admittedfollowing a fall Tuesday found to have a rightnondisplaced greater trochanter and superior intertrochanteric fracture. He has ESRD on HD at [facility] on [address], MWF using a left thigh AV graft. Work-up of his fall identified right sided.Patient's last dialysis was on 3/1.   Other past history includes history of CVA, PVD, PUD, right foot TMA, ulcers of the left toes(followed by Dr. Sharol Given).  Assessment/ Plan:   1. ESRD L Thigh AVG, [facility] [place] MWF. No outpt records; labs stable -> HD on Thur  - Last  HD 3/5 with UF 500 ml net; next HD Mon on MWF regimen.  2. R hip fracture (nondisplaced avulsion fx GT rt hip) - rx is PT with progressive weightbearing per ortho w/ 2week office f/u 3. Anemia, Hb stable not an issue 4. 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 PVD 6. Hx/o CVA  Subjective:   Tolerated HD Fri,  denies f/c/n/v/dyspnea/cp. Describes pain as 6/10   Objective:   BP 96/62 (BP Location: Right Arm)   Pulse 93   Temp 97.8 F (36.6 C) (Oral)   Resp 20   Ht 5\' 5"  (1.651 m)   Wt 66.4 kg   SpO2 95%   BMI 24.36 kg/m   Intake/Output Summary (Last 24 hours) at 07/03/2019 1058 Last data filed at 07/03/2019 0900 Gross per 24 hour  Intake 480 ml  Output 500 ml  Net -20 ml   Weight change: -1.4 kg  Physical Exam: GEN:NAD JZ:PHXTAVW, normal S1 and S2, no rub PULM:Clear bilaterally, normal work of breathing PVX:YIAX, nontender SKIN:Some ulcers of the distal left toes, right TMA well-healed EXT:No peripheral edema VASCULAR: Left thigh AV graft with thrill  Imaging: No results found.  Labs: BMET Recent Labs  Lab 06/30/19 0351 07/01/19 1220 07/02/19 0534  NA 138 135 134*  K 4.3 5.3* 5.8*  CL 99 95* 97*  CO2 23 23 24   GLUCOSE 111* 129* 86  BUN 36* 46* 22  CREATININE 10.17* 12.57* 7.52*  CALCIUM 8.8* 8.7* 9.0  PHOS  --  6.0*  --    CBC Recent Labs  Lab  06/30/19 0351 07/01/19 1220  WBC 12.7* 13.3*  NEUTROABS 10.6*  --   HGB 15.1 13.6  HCT 46.7 42.1  MCV 66.6* 66.0*  PLT 253 264    Medications:    . vitamin C  500 mg Oral Daily  . atorvastatin  40 mg Oral Daily  . Chlorhexidine Gluconate Cloth  6 each Topical Q0600  . cinacalcet  60 mg Oral QPM  . docusate sodium  100 mg Oral BID  . feeding supplement (PRO-STAT SUGAR FREE 64)  30 mL Oral BID  . heparin  5,000 Units Subcutaneous Q8H  . midodrine  10 mg Oral Q M,W,F  . multivitamin  1 tablet Oral QHS  . sevelamer carbonate  3,200 mg Oral TID with meals  . cholecalciferol  1,000 Units Oral Daily      Otelia Santee, MD 07/03/2019, 10:58 AM

## 2019-07-03 NOTE — TOC Progression Note (Signed)
Transition of Care (TOC) - Progression Note    Patient Details  Name: [name]. MRN: [id number] Date of Birth: 1941/02/24  Transition of Care [facility]) CM/SW [place],  Phone Number: [phone] 07/03/2019, 1:22 PM  Clinical Narrative:    CSW spoke with patient in regards to bed offer. Patient stated that he would go to [facility]. CSW followed up with [facility] and awaiting a return call.  CSW will continue to follow for discharge planning needs.  Expected Discharge Plan: [place] Barriers to Discharge: Continued Medical Work up  Expected Discharge Plan and Services Expected Discharge Plan: [place] In-house Referral: Clinical Social Work   Post Acute Care Choice: [place] Living arrangements for the past 2 months: Single Family Home Expected Discharge Date: 07/02/19                                     Social Determinants of Health (SDOH) Interventions    Readmission Risk Interventions No flowsheet data found.

## 2019-07-03 NOTE — Progress Notes (Addendum)
PROGRESS NOTE    [name]  GMW:[id number] DOB: 1941-02-15 DOA: 06/30/2019 PCP: [name], MD     Brief Narrative:  79 y/o admitted from home after a mechanical fall resulted in a Nondisplaced greater trochanter and superior intertrochanteric fracture. 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 He is admitted for presumptive surgery as a transfer from [place]:   Principal Problem:   Closed avulsion fracture of right hip [facility]) Active Problems:   ESRD on dialysis ([place])   Non-ischemic cardiomyopathy- EF 35- 45%   CAD- 40-50% LAD at cath 07/11/15   Moderate aortic stenosis   PVD (peripheral vascular disease) ([place])   Essential hypertension   Severe protein-calorie malnutrition ([place])   Closed intertrochanteric fracture of right hip ([place])   Hip fracture ([place])   Fall   Right Hip fx -Per orthopedics: no surgical intervention is necessary. -PT/OT recs SNF. Currently awaiting bed placement which has been complicated due to his dialysis needs.   ESRD on HD -Was dialyzed Fri and Sat. -Nephrology is on board.  PVD -S/p right mid foot amputation. -chronic left toe wounds managed by ortho and podiatry.  NICM -Stable, compensated.  H/o CVA -Noted  Hyperlipidemia -Continue Lipitor  Moderate Aortic Stenosis -Mindful IVF.   DVT prophylaxis: SQ heparin Code Status: Full code Family Communication: patient only Disposition Plan: OK for DC as soon as bed becomes available. No changes to DC Summary are required.  Consultants:   Orthopedics, Dr. Sharol Given   Procedures:   None  Antimicrobials:  Anti-infectives (From admission, onward)   None       Subjective: In bed, no complaints.  Objective: Vitals:   07/02/19 2014 07/02/19 2016 07/03/19 0454 07/03/19 0700  BP: (!) 81/42 (!) 92/59 (!) 95/59 96/62  Pulse: (!) 52 98 92 93  Resp:      Temp: 98.4 F (36.9 C)  98.7 F (37.1 C) 97.8 F (36.6 C)  TempSrc: Oral  Oral Oral  SpO2: 97%  93% 95%  Weight:      Height:        Intake/Output Summary (Last 24 hours) at 07/03/2019 1129 Last data filed at 07/03/2019 0900 Gross per 24 hour  Intake 480 ml  Output 500 ml  Net -20 ml   Filed Weights   07/01/19 1625 07/02/19 1523 07/02/19 1905  Weight: 67.7 kg 66.9 kg 66.4 kg    Examination:  General exam: Alert, awake, oriented x 3 Respiratory system: Clear to auscultation. Respiratory effort normal. Cardiovascular system:RRR. No murmurs, rubs, gallops. Gastrointestinal system: Abdomen is nondistended, soft and nontender. No organomegaly or masses felt. Normal bowel sounds heard. Central nervous system: Alert and oriented. No focal neurological deficits. Extremities: Right mid foot amputation, left with dry gangrenous changes of several toes. No edema. Psychiatry: Judgement and insight appear normal. Mood & affect appropriate.     Data Reviewed: I have personally reviewed following labs and imaging studies  CBC: Recent Labs  Lab 06/30/19 0351 07/01/19 1220  WBC 12.7* 13.3*  NEUTROABS 10.6*  --   HGB 15.1 13.6  HCT 46.7 42.1  MCV 66.6* 66.0*  PLT 253 440   Basic Metabolic Panel: Recent  Labs  Lab 06/30/19 0351 07/01/19 1220 07/02/19 0534  NA 138 135 134*  K 4.3 5.3* 5.8*  CL 99 95* 97*  CO2 23 23 24   GLUCOSE 111* 129* 86  BUN 36* 46* 22  CREATININE 10.17* 12.57* 7.52*  CALCIUM 8.8* 8.7* 9.0  PHOS  --  6.0*  --    GFR: Estimated Creatinine Clearance: 7.3 mL/min (A) (by C-G formula based on SCr of 7.52 mg/dL (H)). Liver Function Tests: Recent Labs  Lab 07/01/19  1220  ALBUMIN 2.5*   No results for input(s): LIPASE, AMYLASE in the last 168 hours. No results for input(s): AMMONIA in the last 168 hours. Coagulation Profile: No results for input(s): INR, PROTIME in the last 168 hours. Cardiac Enzymes: No results for input(s): CKTOTAL, CKMB, CKMBINDEX, TROPONINI in the last 168 hours. BNP (last 3 results) No results for input(s): PROBNP in the last 8760 hours. HbA1C: No results for input(s): HGBA1C in the last 72 hours. CBG: Recent Labs  Lab 07/01/19 0640  GLUCAP 78   Lipid Profile: No results for input(s): CHOL, HDL, LDLCALC, TRIG, CHOLHDL, LDLDIRECT in the last 72 hours. Thyroid Function Tests: No results for input(s): TSH, T4TOTAL, FREET4, T3FREE, THYROIDAB in the last 72 hours. Anemia Panel: No results for input(s): VITAMINB12, FOLATE, FERRITIN, TIBC, IRON, RETICCTPCT in the last 72 hours. Urine analysis: No results found for: COLORURINE, APPEARANCEUR, LABSPEC, PHURINE, GLUCOSEU, HGBUR, BILIRUBINUR, KETONESUR, PROTEINUR, UROBILINOGEN, NITRITE, LEUKOCYTESUR Sepsis Labs: @LABRCNTIP (procalcitonin:4,lacticidven:4)  ) Recent Results (from the past 240 hour(s))  Respiratory Panel by RT PCR (Flu A&B, Covid) - Nasopharyngeal Swab     Status: None   Collection Time: 06/30/19  5:16 AM   Specimen: Nasopharyngeal Swab  Result Value Ref Range Status   SARS Coronavirus 2 by RT PCR NEGATIVE NEGATIVE Final    Comment: (NOTE) SARS-CoV-2 target nucleic acids are NOT DETECTED. The SARS-CoV-2 RNA is generally detectable in upper respiratoy specimens during the acute phase of infection. The lowest concentration of SARS-CoV-2 viral copies this assay can detect is 131 copies/mL. A negative result does not preclude SARS-Cov-2 infection and should not be used as the sole basis for treatment or other patient management decisions. A negative result may occur with  improper specimen collection/handling, submission of specimen other than nasopharyngeal  swab, presence of viral mutation(s) within the areas targeted by this assay, and inadequate number of viral copies (<131 copies/mL). A negative result must be combined with clinical observations, patient history, and epidemiological information. The expected result is Negative. Fact Sheet for Patients:  PinkCheek.be Fact Sheet for Healthcare Providers:  GravelBags.it This test is not yet ap proved or cleared by the [place] FDA and  has been authorized for detection and/or diagnosis of SARS-CoV-2 by FDA under an Emergency Use Authorization (EUA). This EUA will remain  in effect (meaning this test can be used) for the duration of the COVID-19 declaration under Section 564(b)(1) of the Act, 21 U.S.C. section 360bbb-3(b)(1), unless the authorization is terminated or revoked sooner.    Influenza A by PCR NEGATIVE NEGATIVE Final   Influenza B by PCR NEGATIVE NEGATIVE Final    Comment: (NOTE) The Xpert Xpress SARS-CoV-2/FLU/RSV assay is intended as an aid in  the diagnosis of influenza from Nasopharyngeal swab specimens and  should not be used as a sole basis for treatment. Nasal washings and  aspirates are unacceptable for Xpert Xpress SARS-CoV-2/FLU/RSV  testing. Fact Sheet for Patients: PinkCheek.be Fact Sheet for Healthcare Providers: GravelBags.it This test is not yet approved or  cleared by the [place] and  has been authorized for detection and/or diagnosis of SARS-CoV-2 by  FDA under an Emergency Use Authorization (EUA). This EUA will remain  in effect (meaning this test can be used) for the duration of the  Covid-19 declaration under Section 564(b)(1) of the Act, 21  U.S.C. section 360bbb-3(b)(1), unless the authorization is  terminated or revoked. Performed at [facility], [address], [place], [place] [address]   SARS CORONAVIRUS 2 (TAT 6-24 HRS)  Nasopharyngeal Nasopharyngeal Swab     Status: None   Collection Time: 07/02/19  2:45 PM   Specimen: Nasopharyngeal Swab  Result Value Ref Range Status   SARS Coronavirus 2 NEGATIVE NEGATIVE Final    Comment: (NOTE) SARS-CoV-2 target nucleic acids are NOT DETECTED. The SARS-CoV-2 RNA is generally detectable in upper and lower respiratory specimens during the acute phase of infection. Negative results do not preclude SARS-CoV-2 infection, do not rule out co-infections with other pathogens, and should not be used as the sole basis for treatment or other patient management decisions. Negative results must be combined with clinical observations, patient history, and epidemiological information. The expected result is Negative. Fact Sheet for Patients: SugarRoll.be Fact Sheet for Healthcare Providers: https://www.woods-mathews.com/ This test is not yet approved or cleared by the [place] FDA and  has been authorized for detection and/or diagnosis of SARS-CoV-2 by FDA under an Emergency Use Authorization (EUA). This EUA will remain  in effect (meaning this test can be used) for the duration of the COVID-19 declaration under Section 56 4(b)(1) of the Act, 21 U.S.C. section 360bbb-3(b)(1), unless the authorization is terminated or revoked sooner. Performed at [facility], [place] [address], [place], [place] [address]          Radiology Studies: No results found.      Scheduled Meds: . vitamin C  500 mg Oral Daily  . atorvastatin  40 mg Oral Daily  . Chlorhexidine Gluconate Cloth  6 each Topical Q0600  . cinacalcet  60 mg Oral QPM  . docusate sodium  100 mg Oral BID  . feeding supplement (PRO-STAT SUGAR FREE 64)  30 mL Oral BID  . heparin  5,000 Units Subcutaneous Q8H  . midodrine  10 mg Oral Q M,W,F  . multivitamin  1 tablet Oral QHS  . sevelamer carbonate  3,200 mg Oral TID with meals  . cholecalciferol  1,000 Units Oral  Daily   Continuous Infusions: . methocarbamol (ROBAXIN) IV       LOS: 3 days    Time spent: 15 minutes. Greater than 50% of this time was spent in direct contact with the patient, coordinating care and discussing relevant ongoing clinical issues, including formulating and discussing plan of care with orthopedics.     Lelon Frohlich, MD [facility] Pager [phone]  If 7PM-7AM, please contact night-coverage [email or web address] Password [facility] 07/03/2019, 11:29 AM

## 2019-07-04 DIAGNOSIS — T8781 Dehiscence of amputation stump: Secondary | ICD-10-CM | POA: Diagnosis present

## 2019-07-04 DIAGNOSIS — I5032 Chronic diastolic (congestive) heart failure: Secondary | ICD-10-CM | POA: Diagnosis present

## 2019-07-04 DIAGNOSIS — I679 Cerebrovascular disease, unspecified: Secondary | ICD-10-CM | POA: Diagnosis not present

## 2019-07-04 DIAGNOSIS — G9341 Metabolic encephalopathy: Secondary | ICD-10-CM | POA: Diagnosis present

## 2019-07-04 DIAGNOSIS — R4182 Altered mental status, unspecified: Secondary | ICD-10-CM | POA: Diagnosis not present

## 2019-07-04 DIAGNOSIS — Z978 Presence of other specified devices: Secondary | ICD-10-CM | POA: Diagnosis not present

## 2019-07-04 DIAGNOSIS — I2510 Atherosclerotic heart disease of native coronary artery without angina pectoris: Secondary | ICD-10-CM | POA: Diagnosis present

## 2019-07-04 DIAGNOSIS — Z89421 Acquired absence of other right toe(s): Secondary | ICD-10-CM | POA: Diagnosis not present

## 2019-07-04 DIAGNOSIS — M86172 Other acute osteomyelitis, left ankle and foot: Secondary | ICD-10-CM | POA: Diagnosis not present

## 2019-07-04 DIAGNOSIS — M25551 Pain in right hip: Secondary | ICD-10-CM | POA: Diagnosis not present

## 2019-07-04 DIAGNOSIS — I493 Ventricular premature depolarization: Secondary | ICD-10-CM | POA: Diagnosis present

## 2019-07-04 DIAGNOSIS — Z7902 Long term (current) use of antithrombotics/antiplatelets: Secondary | ICD-10-CM | POA: Diagnosis not present

## 2019-07-04 DIAGNOSIS — T8149XA Infection following a procedure, other surgical site, initial encounter: Secondary | ICD-10-CM | POA: Diagnosis not present

## 2019-07-04 DIAGNOSIS — D631 Anemia in chronic kidney disease: Secondary | ICD-10-CM | POA: Diagnosis present

## 2019-07-04 DIAGNOSIS — I469 Cardiac arrest, cause unspecified: Secondary | ICD-10-CM | POA: Diagnosis not present

## 2019-07-04 DIAGNOSIS — M6281 Muscle weakness (generalized): Secondary | ICD-10-CM | POA: Diagnosis present

## 2019-07-04 DIAGNOSIS — I69354 Hemiplegia and hemiparesis following cerebral infarction affecting left non-dominant side: Secondary | ICD-10-CM | POA: Diagnosis not present

## 2019-07-04 DIAGNOSIS — E1151 Type 2 diabetes mellitus with diabetic peripheral angiopathy without gangrene: Secondary | ICD-10-CM | POA: Diagnosis present

## 2019-07-04 DIAGNOSIS — N2581 Secondary hyperparathyroidism of renal origin: Secondary | ICD-10-CM | POA: Diagnosis present

## 2019-07-04 DIAGNOSIS — S72144D Nondisplaced intertrochanteric fracture of right femur, subsequent encounter for closed fracture with routine healing: Secondary | ICD-10-CM | POA: Diagnosis not present

## 2019-07-04 DIAGNOSIS — Z87891 Personal history of nicotine dependence: Secondary | ICD-10-CM | POA: Diagnosis not present

## 2019-07-04 DIAGNOSIS — T8141XA Infection following a procedure, superficial incisional surgical site, initial encounter: Secondary | ICD-10-CM | POA: Diagnosis present

## 2019-07-04 DIAGNOSIS — Z886 Allergy status to analgesic agent status: Secondary | ICD-10-CM | POA: Diagnosis not present

## 2019-07-04 DIAGNOSIS — M19042 Primary osteoarthritis, left hand: Secondary | ICD-10-CM | POA: Diagnosis not present

## 2019-07-04 DIAGNOSIS — L89152 Pressure ulcer of sacral region, stage 2: Secondary | ICD-10-CM | POA: Diagnosis present

## 2019-07-04 DIAGNOSIS — E875 Hyperkalemia: Secondary | ICD-10-CM | POA: Diagnosis present

## 2019-07-04 DIAGNOSIS — I96 Gangrene, not elsewhere classified: Secondary | ICD-10-CM | POA: Diagnosis not present

## 2019-07-04 DIAGNOSIS — S72001A Fracture of unspecified part of neck of right femur, initial encounter for closed fracture: Secondary | ICD-10-CM | POA: Diagnosis not present

## 2019-07-04 DIAGNOSIS — E8889 Other specified metabolic disorders: Secondary | ICD-10-CM | POA: Diagnosis not present

## 2019-07-04 DIAGNOSIS — R9431 Abnormal electrocardiogram [ECG] [EKG]: Secondary | ICD-10-CM | POA: Diagnosis not present

## 2019-07-04 DIAGNOSIS — Z89432 Acquired absence of left foot: Secondary | ICD-10-CM | POA: Diagnosis not present

## 2019-07-04 DIAGNOSIS — D509 Iron deficiency anemia, unspecified: Secondary | ICD-10-CM | POA: Diagnosis not present

## 2019-07-04 DIAGNOSIS — Z20822 Contact with and (suspected) exposure to covid-19: Secondary | ICD-10-CM | POA: Diagnosis not present

## 2019-07-04 DIAGNOSIS — A4181 Sepsis due to Enterococcus: Secondary | ICD-10-CM | POA: Diagnosis present

## 2019-07-04 DIAGNOSIS — F419 Anxiety disorder, unspecified: Secondary | ICD-10-CM | POA: Diagnosis present

## 2019-07-04 DIAGNOSIS — D72829 Elevated white blood cell count, unspecified: Secondary | ICD-10-CM | POA: Diagnosis not present

## 2019-07-04 DIAGNOSIS — I739 Peripheral vascular disease, unspecified: Secondary | ICD-10-CM | POA: Diagnosis present

## 2019-07-04 DIAGNOSIS — I959 Hypotension, unspecified: Secondary | ICD-10-CM | POA: Diagnosis not present

## 2019-07-04 DIAGNOSIS — Z899 Acquired absence of limb, unspecified: Secondary | ICD-10-CM | POA: Diagnosis not present

## 2019-07-04 DIAGNOSIS — I429 Cardiomyopathy, unspecified: Secondary | ICD-10-CM | POA: Diagnosis present

## 2019-07-04 DIAGNOSIS — R5381 Other malaise: Secondary | ICD-10-CM | POA: Diagnosis not present

## 2019-07-04 DIAGNOSIS — N186 End stage renal disease: Secondary | ICD-10-CM | POA: Diagnosis present

## 2019-07-04 DIAGNOSIS — I472 Ventricular tachycardia: Secondary | ICD-10-CM | POA: Diagnosis present

## 2019-07-04 DIAGNOSIS — M2550 Pain in unspecified joint: Secondary | ICD-10-CM | POA: Diagnosis not present

## 2019-07-04 DIAGNOSIS — I352 Nonrheumatic aortic (valve) stenosis with insufficiency: Secondary | ICD-10-CM | POA: Diagnosis not present

## 2019-07-04 DIAGNOSIS — I120 Hypertensive chronic kidney disease with stage 5 chronic kidney disease or end stage renal disease: Secondary | ICD-10-CM | POA: Diagnosis not present

## 2019-07-04 DIAGNOSIS — Z79899 Other long term (current) drug therapy: Secondary | ICD-10-CM | POA: Diagnosis not present

## 2019-07-04 DIAGNOSIS — E872 Acidosis: Secondary | ICD-10-CM | POA: Diagnosis present

## 2019-07-04 DIAGNOSIS — I10 Essential (primary) hypertension: Secondary | ICD-10-CM | POA: Diagnosis present

## 2019-07-04 DIAGNOSIS — L089 Local infection of the skin and subcutaneous tissue, unspecified: Secondary | ICD-10-CM | POA: Diagnosis present

## 2019-07-04 DIAGNOSIS — I499 Cardiac arrhythmia, unspecified: Secondary | ICD-10-CM | POA: Diagnosis not present

## 2019-07-04 DIAGNOSIS — I428 Other cardiomyopathies: Secondary | ICD-10-CM | POA: Diagnosis present

## 2019-07-04 DIAGNOSIS — R2689 Other abnormalities of gait and mobility: Secondary | ICD-10-CM | POA: Diagnosis present

## 2019-07-04 DIAGNOSIS — R2681 Unsteadiness on feet: Secondary | ICD-10-CM | POA: Diagnosis not present

## 2019-07-04 DIAGNOSIS — Z992 Dependence on renal dialysis: Secondary | ICD-10-CM | POA: Diagnosis not present

## 2019-07-04 DIAGNOSIS — D649 Anemia, unspecified: Secondary | ICD-10-CM | POA: Diagnosis not present

## 2019-07-04 DIAGNOSIS — I340 Nonrheumatic mitral (valve) insufficiency: Secondary | ICD-10-CM | POA: Diagnosis not present

## 2019-07-04 DIAGNOSIS — Z95828 Presence of other vascular implants and grafts: Secondary | ICD-10-CM | POA: Diagnosis not present

## 2019-07-04 DIAGNOSIS — F05 Delirium due to known physiological condition: Secondary | ICD-10-CM | POA: Diagnosis present

## 2019-07-04 DIAGNOSIS — A419 Sepsis, unspecified organism: Secondary | ICD-10-CM | POA: Diagnosis not present

## 2019-07-04 DIAGNOSIS — D473 Essential (hemorrhagic) thrombocythemia: Secondary | ICD-10-CM | POA: Diagnosis not present

## 2019-07-04 DIAGNOSIS — I70261 Atherosclerosis of native arteries of extremities with gangrene, right leg: Secondary | ICD-10-CM | POA: Diagnosis not present

## 2019-07-04 DIAGNOSIS — E871 Hypo-osmolality and hyponatremia: Secondary | ICD-10-CM | POA: Diagnosis present

## 2019-07-04 DIAGNOSIS — I6930 Unspecified sequelae of cerebral infarction: Secondary | ICD-10-CM | POA: Diagnosis not present

## 2019-07-04 DIAGNOSIS — Z8249 Family history of ischemic heart disease and other diseases of the circulatory system: Secondary | ICD-10-CM | POA: Diagnosis not present

## 2019-07-04 DIAGNOSIS — J9811 Atelectasis: Secondary | ICD-10-CM | POA: Diagnosis not present

## 2019-07-04 DIAGNOSIS — I700 Atherosclerosis of aorta: Secondary | ICD-10-CM | POA: Diagnosis present

## 2019-07-04 DIAGNOSIS — E43 Unspecified severe protein-calorie malnutrition: Secondary | ICD-10-CM | POA: Diagnosis present

## 2019-07-04 DIAGNOSIS — Z4781 Encounter for orthopedic aftercare following surgical amputation: Secondary | ICD-10-CM | POA: Diagnosis not present

## 2019-07-04 DIAGNOSIS — R1311 Dysphagia, oral phase: Secondary | ICD-10-CM | POA: Diagnosis present

## 2019-07-04 DIAGNOSIS — I4891 Unspecified atrial fibrillation: Secondary | ICD-10-CM | POA: Diagnosis not present

## 2019-07-04 DIAGNOSIS — Z89431 Acquired absence of right foot: Secondary | ICD-10-CM | POA: Diagnosis not present

## 2019-07-04 DIAGNOSIS — R6520 Severe sepsis without septic shock: Secondary | ICD-10-CM | POA: Diagnosis not present

## 2019-07-04 DIAGNOSIS — R7989 Other specified abnormal findings of blood chemistry: Secondary | ICD-10-CM | POA: Diagnosis not present

## 2019-07-04 DIAGNOSIS — Z7401 Bed confinement status: Secondary | ICD-10-CM | POA: Diagnosis not present

## 2019-07-04 DIAGNOSIS — R7881 Bacteremia: Secondary | ICD-10-CM | POA: Diagnosis not present

## 2019-07-04 DIAGNOSIS — E1122 Type 2 diabetes mellitus with diabetic chronic kidney disease: Secondary | ICD-10-CM | POA: Diagnosis present

## 2019-07-04 DIAGNOSIS — G479 Sleep disorder, unspecified: Secondary | ICD-10-CM | POA: Diagnosis not present

## 2019-07-04 DIAGNOSIS — R0902 Hypoxemia: Secondary | ICD-10-CM | POA: Diagnosis not present

## 2019-07-04 DIAGNOSIS — Z89422 Acquired absence of other left toe(s): Secondary | ICD-10-CM | POA: Diagnosis not present

## 2019-07-04 DIAGNOSIS — R011 Cardiac murmur, unspecified: Secondary | ICD-10-CM | POA: Diagnosis not present

## 2019-07-04 DIAGNOSIS — I350 Nonrheumatic aortic (valve) stenosis: Secondary | ICD-10-CM | POA: Diagnosis present

## 2019-07-04 DIAGNOSIS — R52 Pain, unspecified: Secondary | ICD-10-CM | POA: Diagnosis not present

## 2019-07-04 DIAGNOSIS — B952 Enterococcus as the cause of diseases classified elsewhere: Secondary | ICD-10-CM | POA: Diagnosis not present

## 2019-07-04 DIAGNOSIS — I132 Hypertensive heart and chronic kidney disease with heart failure and with stage 5 chronic kidney disease, or end stage renal disease: Secondary | ICD-10-CM | POA: Diagnosis present

## 2019-07-04 DIAGNOSIS — Y835 Amputation of limb(s) as the cause of abnormal reaction of the patient, or of later complication, without mention of misadventure at the time of the procedure: Secondary | ICD-10-CM | POA: Diagnosis present

## 2019-07-04 NOTE — TOC Transition Note (Signed)
Transition of Care [facility]) - CM/SW Discharge Note   Patient Details  Name: Joseph LAUDERBACK Sr. MRN: [id number] Date of Birth: 1940-08-14  Transition of Care [facility]) CM/SW Contact:  Bary Castilla, LCSW Phone Number: [phone] 07/04/2019, 9:32 AM   Clinical Narrative:    Patient will DC to: [place]? Anticipated DC date: 07/04/19 Family notified:Linda Transport by: [name]   Per MD patient ready for DC to [facility] RN, patient, patient's family, and facility notified of DC. Discharge Summary sent to facility. RN given number for report [phone]  SELTR VUYEBXID room B23-1. Marland Kitchen DC packet on chart. Ambulance transport requested for patient.   CSW signing off.   [name], [name] [phone]    Final next level of care: Skilled Nursing Facility Barriers to Discharge: No Barriers Identified   Patient Goals and CMS Choice Patient states their goals for this hospitalization and ongoing recovery are:: get stronger so I can return home CMS Medicare.gov Compare Post Acute Care list provided to:: Patient Choice offered to / list presented to : Patient  Discharge Placement              Patient chooses bed at: [facility]) Patient to be transferred to facility by: [facility] Name of family member notified: [name] Daughter Patient and family notified of of transfer: 07/04/19  Discharge Plan and Services In-house Referral: Clinical Social Work   Post Acute Care Choice: [place]                               Social Determinants of Health (SDOH) Interventions     Readmission Risk Interventions No flowsheet data found.

## 2019-07-04 NOTE — Discharge Summary (Signed)
Physician Discharge Summary  SAJJAD HONEA Sr. EZM:[id number] DOB: Oct 09, 1940 DOA: 06/30/2019  PCP: [name], MD  Admit date: 06/30/2019 Discharge date: 07/04/2019  Time spent: 45 minutes  Recommendations for Outpatient Follow-up:  -To be discharged to SNF once bed available for ST rehab following hip fracture that did not require operative repair. -Continue hemodyalisis as scheduled on a MWF schedule. -Follow up with Dr. Sharol Given for the gangrene of left toes as he missed outpatient appointment while hospitalized. -Follow up with PCP in 2-3 weeks.   Discharge Diagnoses:  Principal Problem:   Closed avulsion fracture of right hip ([place]) Active Problems:   ESRD on dialysis ([place])   Non-ischemic cardiomyopathy- EF 35- 45%   CAD- 40-50% LAD at cath 07/11/15   Moderate aortic stenosis   PVD (peripheral vascular disease) ([place])   Essential hypertension   Severe protein-calorie malnutrition ([place])   Closed intertrochanteric fracture of right hip ([place])   Hip fracture [facility])   Fall   Discharge Condition: Stable  Filed Weights   07/01/19 1625 07/02/19 1523 07/02/19 1905  Weight: 67.7 kg 66.9 kg 66.4 kg    History of present illness:  As per Dr. Volanda Napoleon on 3/3: Joseph [place] Sr. is a 79 y.o. 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  Patient states he has been having some loose stools overnight which is not unusual for him.  He woke up last night trying to get to the bathroom and had a mechanical fall and landed on his right hip.  He had significant  pain on the right hip and has had difficulty with walking since then.  No head injury or loss of consciousness reported.  No back or neck pain.  Has a history of end-stage renal disease and is on hemodialysis.  Hospital Course:   Right Hip fx -Discussed case with Dr. Sharol Given: no surgical intervention is necessary. -PT/OT has recommended SNF. TOC team is on board. Will DC once bed is available.  ESRD on HD -Due for HD today. Schedule is MWF. -Nephrology is on board.  PVD -S/p right mid foot amputation. -chronic left toe wounds managed by ortho. -Per Dr. Sharol Given, f/u in office in 2 weeks.  NICM -Stable, compensated.  H/o CVA -Noted  Hyperlipidemia -Continue Lipitor  Moderate Aortic Stenosis -Compensated; no CP/SOB this admission.   Procedures:  None   Consultations:  Orthopedics  Discharge Instructions   Allergies as of 07/04/2019      Reactions   Aspirin Other (See Comments)   Causes internal bleeding  History of ulcers      Medication List    TAKE these medications   atorvastatin 40 MG tablet Commonly known as: LIPITOR Take 40 mg by mouth daily.   b complex-vitamin c-folic acid 0.8 MG Tabs tablet Take 1 tablet by mouth See admin instructions. Take one tablet daily on Tuesdays, Thursdays, Saturdays, and Sundays.   cholecalciferol 25 MCG (1000 UNIT) tablet Commonly known as: VITAMIN D3 Take 1,000 Units by mouth daily.   cinacalcet 60 MG tablet Commonly known as: SENSIPAR Take 60 mg by mouth every evening.   clopidogrel 75 MG tablet  Commonly known as: PLAVIX Take 75 mg by mouth daily.   midodrine 10 MG tablet Commonly known as: PROAMATINE Take 10 mg by mouth every Monday, Wednesday, and Friday. before dialysis.   sevelamer carbonate 800 MG tablet Commonly known as: RENVELA Take 1,600-3,200 mg by mouth See admin instructions. Take 3200  mg by mouth 3 times daily with full meals and take 1600 mg by mouth with snacks.   vitamin C 500 MG  tablet Commonly known as: ASCORBIC ACID Take 500 mg by mouth daily.      Allergies  Allergen Reactions  . Aspirin Other (See Comments)    Causes internal bleeding  History of ulcers   Follow-up Information    Joseph Minion, MD. Schedule an appointment as soon as possible for a visit.   Specialty: Orthopedic Surgery Why: as soon as possible following DC Contact information: [address] [place] [address] [phone]        [name], MD. Schedule an appointment as soon as possible for a visit in 3 week(s).   Specialty: Internal Medicine Contact information: [place] [address] [phone]        Satira Sark, MD .   Specialty: Cardiology Contact information: [place] [address] [phone]            The results of significant diagnostics from this hospitalization (including imaging, microbiology, ancillary and laboratory) are listed below for reference.    Significant Diagnostic Studies: DG Chest 1 View  Result Date: 06/30/2019 CLINICAL DATA:  Fall fall today EXAM: CHEST  1 VIEW COMPARISON:  August 02, 2018 FINDINGS: The heart size and mediastinal contours are within normal limits. Aortic knob calcifications. Both lungs are clear. No acute osseous findings. Prior fixation seen in the right shoulder. IMPRESSION: No active disease. Electronically Signed   By: Prudencio Pair M.D.   On: 06/30/2019 04:54   DG Foot Complete Left  Result Date: 06/30/2019 CLINICAL DATA:  Gangrene of the toes and pain after fall EXAM: LEFT FOOT - COMPLETE 3+ VIEW COMPARISON:  None. FINDINGS: No fracture or dislocation. There is diffuse osteopenia. Erosive type change seen at the distal third and fifth phalanx. Calcaneal enthesophytes are seen. Dense vascular calcifications are noted. IMPRESSION: No acute fracture. Erosive changes with the distal third and fifth phalanx. Electronically Signed   By: Prudencio Pair M.D.   On:  06/30/2019 04:55   DG Hip Unilat W or Wo Pelvis 2-3 Views Right  Result Date: 06/30/2019 CLINICAL DATA:  Fall and hip pain EXAM: DG HIP (WITH OR WITHOUT PELVIS) 2-3V RIGHT COMPARISON:  None. FINDINGS: There is a nondisplaced fracture seen through the greater trochanter with slight impaction and a probable nondisplaced intratrochanteric fracture. The femoral head is still well seated within the acetabulum. There is diffuse osteopenia. Dense vascular calcifications are noted. IMPRESSION: Nondisplaced greater trochanter and intertrochanteric fractures. Electronically Signed   By: Prudencio Pair M.D.   On: 06/30/2019 04:53   DG FEMUR, MIN 2 VIEWS RIGHT  Result Date: 06/30/2019 CLINICAL DATA:  Fall EXAM: RIGHT FEMUR 2 VIEWS COMPARISON:  None. FINDINGS: There is nondisplaced mildly impacted fracture seen through the greater trochanter and intertrochanteric regions. No other fractures are seen. There is diffuse osteopenia. Dense vascular calcifications are noted. IMPRESSION: Nondisplaced greater trochanter and superior intertrochanteric fracture. Electronically Signed   By: Prudencio Pair M.D.   On: 06/30/2019 04:53    Microbiology: Recent Results (from the past 240 hour(s))  Respiratory Panel  by RT PCR (Flu A&B, Covid) - Nasopharyngeal Swab     Status: None   Collection Time: 06/30/19  5:16 AM   Specimen: Nasopharyngeal Swab  Result Value Ref Range Status   SARS Coronavirus 2 by RT PCR NEGATIVE NEGATIVE Final    Comment: (NOTE) SARS-CoV-2 target nucleic acids are NOT DETECTED. The SARS-CoV-2 RNA is generally detectable in upper respiratoy specimens during the acute phase of infection. The lowest concentration of SARS-CoV-2 viral copies this assay can detect is 131 copies/mL. A negative result does not preclude SARS-Cov-2 infection and should not be used as the sole basis for treatment or other patient management decisions. A negative result may occur with  improper specimen collection/handling,  submission of specimen other than nasopharyngeal swab, presence of viral mutation(s) within the areas targeted by this assay, and inadequate number of viral copies (<131 copies/mL). A negative result must be combined with clinical observations, patient history, and epidemiological information. The expected result is Negative. Fact Sheet for Patients:  PinkCheek.be Fact Sheet for Healthcare Providers:  GravelBags.it This test is not yet ap proved or cleared by the [place] FDA and  has been authorized for detection and/or diagnosis of SARS-CoV-2 by FDA under an Emergency Use Authorization (EUA). This EUA will remain  in effect (meaning this test can be used) for the duration of the COVID-19 declaration under Section 564(b)(1) of the Act, 21 U.S.C. section 360bbb-3(b)(1), unless the authorization is terminated or revoked sooner.    Influenza A by PCR NEGATIVE NEGATIVE Final   Influenza B by PCR NEGATIVE NEGATIVE Final    Comment: (NOTE) The Xpert Xpress SARS-CoV-2/FLU/RSV assay is intended as an aid in  the diagnosis of influenza from Nasopharyngeal swab specimens and  should not be used as a sole basis for treatment. Nasal washings and  aspirates are unacceptable for Xpert Xpress SARS-CoV-2/FLU/RSV  testing. Fact Sheet for Patients: PinkCheek.be Fact Sheet for Healthcare Providers: GravelBags.it This test is not yet approved or cleared by the [place] FDA and  has been authorized for detection and/or diagnosis of SARS-CoV-2 by  FDA under an Emergency Use Authorization (EUA). This EUA will remain  in effect (meaning this test can be used) for the duration of the  Covid-19 declaration under Section 564(b)(1) of the Act, 21  U.S.C. section 360bbb-3(b)(1), unless the authorization is  terminated or revoked. Performed at [facility], [address],  [place], [place] [address]   SARS CORONAVIRUS 2 (TAT 6-24 HRS) Nasopharyngeal Nasopharyngeal Swab     Status: None   Collection Time: 07/02/19  2:45 PM   Specimen: Nasopharyngeal Swab  Result Value Ref Range Status   SARS Coronavirus 2 NEGATIVE NEGATIVE Final    Comment: (NOTE) SARS-CoV-2 target nucleic acids are NOT DETECTED. The SARS-CoV-2 RNA is generally detectable in upper and lower respiratory specimens during the acute phase of infection. Negative results do not preclude SARS-CoV-2 infection, do not rule out co-infections with other pathogens, and should not be used as the sole basis for treatment or other patient management decisions. Negative results must be combined with clinical observations, patient history, and epidemiological information. The expected result is Negative. Fact Sheet for Patients: SugarRoll.be Fact Sheet for Healthcare Providers: https://www.woods-mathews.com/ This test is not yet approved or cleared by the [place] FDA and  has been authorized for detection and/or diagnosis of SARS-CoV-2 by FDA under an Emergency Use Authorization (EUA). This EUA will remain  in effect (meaning this test can be used) for the duration of the  COVID-19 declaration under Section 56 4(b)(1) of the Act, 21 U.S.C. section 360bbb-3(b)(1), unless the authorization is terminated or revoked sooner. Performed at [facility], [place] [address], [place], [place] [address]      Labs: Basic Metabolic Panel: Recent Labs  Lab 06/30/19 0351 07/01/19 1220 07/02/19 0534  NA 138 135 134*  K 4.3 5.3* 5.8*  CL 99 95* 97*  CO2 23 23 24   GLUCOSE 111* 129* 86  BUN 36* 46* 22  CREATININE 10.17* 12.57* 7.52*  CALCIUM 8.8* 8.7* 9.0  PHOS  --  6.0*  --    Liver Function Tests: Recent Labs  Lab 07/01/19 1220  ALBUMIN 2.5*   No results for input(s): LIPASE, AMYLASE in the last 168 hours. No results for input(s): AMMONIA in the last 168  hours. CBC: Recent Labs  Lab 06/30/19 0351 07/01/19 1220  WBC 12.7* 13.3*  NEUTROABS 10.6*  --   HGB 15.1 13.6  HCT 46.7 42.1  MCV 66.6* 66.0*  PLT 253 264   Cardiac Enzymes: No results for input(s): CKTOTAL, CKMB, CKMBINDEX, TROPONINI in the last 168 hours. BNP: BNP (last 3 results) No results for input(s): BNP in the last 8760 hours.  ProBNP (last 3 results) No results for input(s): PROBNP in the last 8760 hours.  CBG: Recent Labs  Lab 07/01/19 0640  GLUCAP 78       Signed:  [facility] Pager: [phone] 07/04/2019, 9:47 AM

## 2019-07-04 NOTE — Progress Notes (Signed)
Patient remains medically stable to DC to SNF pending bed availability.  Domingo Mend, MD [facility] [phone]

## 2019-07-04 NOTE — Progress Notes (Signed)
PT picked up by [facility] discharged to [facility]. RN called report to [place].

## 2019-07-05 DIAGNOSIS — Z992 Dependence on renal dialysis: Secondary | ICD-10-CM | POA: Diagnosis not present

## 2019-07-05 DIAGNOSIS — N186 End stage renal disease: Secondary | ICD-10-CM | POA: Diagnosis not present

## 2019-07-05 DIAGNOSIS — N2581 Secondary hyperparathyroidism of renal origin: Secondary | ICD-10-CM | POA: Diagnosis not present

## 2019-07-05 DIAGNOSIS — D509 Iron deficiency anemia, unspecified: Secondary | ICD-10-CM | POA: Diagnosis not present

## 2019-07-06 DIAGNOSIS — I679 Cerebrovascular disease, unspecified: Secondary | ICD-10-CM | POA: Diagnosis not present

## 2019-07-06 DIAGNOSIS — I10 Essential (primary) hypertension: Secondary | ICD-10-CM | POA: Diagnosis not present

## 2019-07-06 DIAGNOSIS — S72001A Fracture of unspecified part of neck of right femur, initial encounter for closed fracture: Secondary | ICD-10-CM | POA: Diagnosis not present

## 2019-07-06 DIAGNOSIS — I739 Peripheral vascular disease, unspecified: Secondary | ICD-10-CM | POA: Diagnosis not present

## 2019-07-07 DIAGNOSIS — N186 End stage renal disease: Secondary | ICD-10-CM | POA: Diagnosis not present

## 2019-07-07 DIAGNOSIS — N2581 Secondary hyperparathyroidism of renal origin: Secondary | ICD-10-CM | POA: Diagnosis not present

## 2019-07-07 DIAGNOSIS — D509 Iron deficiency anemia, unspecified: Secondary | ICD-10-CM | POA: Diagnosis not present

## 2019-07-07 DIAGNOSIS — Z992 Dependence on renal dialysis: Secondary | ICD-10-CM | POA: Diagnosis not present

## 2019-07-09 DIAGNOSIS — S72001A Fracture of unspecified part of neck of right femur, initial encounter for closed fracture: Secondary | ICD-10-CM | POA: Diagnosis not present

## 2019-07-09 DIAGNOSIS — D509 Iron deficiency anemia, unspecified: Secondary | ICD-10-CM | POA: Diagnosis not present

## 2019-07-09 DIAGNOSIS — Z992 Dependence on renal dialysis: Secondary | ICD-10-CM | POA: Diagnosis not present

## 2019-07-09 DIAGNOSIS — N186 End stage renal disease: Secondary | ICD-10-CM | POA: Diagnosis not present

## 2019-07-09 DIAGNOSIS — R5381 Other malaise: Secondary | ICD-10-CM | POA: Diagnosis not present

## 2019-07-09 DIAGNOSIS — N2581 Secondary hyperparathyroidism of renal origin: Secondary | ICD-10-CM | POA: Diagnosis not present

## 2019-07-09 DIAGNOSIS — I739 Peripheral vascular disease, unspecified: Secondary | ICD-10-CM | POA: Diagnosis not present

## 2019-07-09 DIAGNOSIS — I10 Essential (primary) hypertension: Secondary | ICD-10-CM | POA: Diagnosis not present

## 2019-07-12 DIAGNOSIS — N186 End stage renal disease: Secondary | ICD-10-CM | POA: Diagnosis not present

## 2019-07-12 DIAGNOSIS — Z992 Dependence on renal dialysis: Secondary | ICD-10-CM | POA: Diagnosis not present

## 2019-07-12 DIAGNOSIS — D509 Iron deficiency anemia, unspecified: Secondary | ICD-10-CM | POA: Diagnosis not present

## 2019-07-12 DIAGNOSIS — N2581 Secondary hyperparathyroidism of renal origin: Secondary | ICD-10-CM | POA: Diagnosis not present

## 2019-07-13 ENCOUNTER — Ambulatory Visit: Payer: Medicare Other | Admitting: Orthopedic Surgery

## 2019-07-13 DIAGNOSIS — I679 Cerebrovascular disease, unspecified: Secondary | ICD-10-CM | POA: Diagnosis not present

## 2019-07-13 DIAGNOSIS — R5381 Other malaise: Secondary | ICD-10-CM | POA: Diagnosis not present

## 2019-07-13 DIAGNOSIS — N186 End stage renal disease: Secondary | ICD-10-CM | POA: Diagnosis not present

## 2019-07-13 DIAGNOSIS — I10 Essential (primary) hypertension: Secondary | ICD-10-CM | POA: Diagnosis not present

## 2019-07-14 DIAGNOSIS — D509 Iron deficiency anemia, unspecified: Secondary | ICD-10-CM | POA: Diagnosis not present

## 2019-07-14 DIAGNOSIS — N186 End stage renal disease: Secondary | ICD-10-CM | POA: Diagnosis not present

## 2019-07-14 DIAGNOSIS — N2581 Secondary hyperparathyroidism of renal origin: Secondary | ICD-10-CM | POA: Diagnosis not present

## 2019-07-14 DIAGNOSIS — Z992 Dependence on renal dialysis: Secondary | ICD-10-CM | POA: Diagnosis not present

## 2019-07-15 ENCOUNTER — Other Ambulatory Visit: Payer: Self-pay

## 2019-07-15 ENCOUNTER — Ambulatory Visit (INDEPENDENT_AMBULATORY_CARE_PROVIDER_SITE_OTHER): Payer: Medicare Other

## 2019-07-15 ENCOUNTER — Ambulatory Visit (INDEPENDENT_AMBULATORY_CARE_PROVIDER_SITE_OTHER): Payer: Medicare Other | Admitting: Orthopedic Surgery

## 2019-07-15 ENCOUNTER — Encounter: Payer: Self-pay | Admitting: Orthopedic Surgery

## 2019-07-15 VITALS — Ht 65.0 in | Wt 146.0 lb

## 2019-07-15 DIAGNOSIS — Z992 Dependence on renal dialysis: Secondary | ICD-10-CM

## 2019-07-15 DIAGNOSIS — Z89431 Acquired absence of right foot: Secondary | ICD-10-CM | POA: Diagnosis not present

## 2019-07-15 DIAGNOSIS — M25551 Pain in right hip: Secondary | ICD-10-CM

## 2019-07-15 DIAGNOSIS — N186 End stage renal disease: Secondary | ICD-10-CM | POA: Diagnosis not present

## 2019-07-15 DIAGNOSIS — I96 Gangrene, not elsewhere classified: Secondary | ICD-10-CM | POA: Diagnosis not present

## 2019-07-15 DIAGNOSIS — I739 Peripheral vascular disease, unspecified: Secondary | ICD-10-CM

## 2019-07-15 NOTE — Progress Notes (Signed)
Office Visit Note   Patient: Joseph BALDYGA Sr.           Date of Birth: [birth year]           MRN: [id number] Visit Date: 07/15/2019              Requested by: [name], MD [address] [place],  [place] [address] PCP: [name], MD  Chief Complaint  Patient presents with  . Right Foot - Follow-up    Hx transmet 04/2017  . Left Foot - Pain      HPI: Patient is a 79 year old gentleman who skilled nursing resident in [place].  He is status post a right transmetatarsal amputation about 2 years ago and presents with black gangrenous changes of the toes of the left foot.  He is also status post a fall March 3 with an avulsion off the greater trochanter.  Patient states that his hip is asymptomatic.  Assessment & Plan: Visit Diagnoses:  1. Pain in right hip   2. S/P transmetatarsal amputation of foot, right ([place])   3. ESRD on dialysis ([place])   4. PVD (peripheral vascular disease) ([place])   5. Gangrene of toe of left foot ([place])     Plan: Patient will continue weightbearing as tolerated on the right lower extremity no displacement of the greater trochanteric hip fracture.  Patient does have progressive dry gangrenous changes of his toes we will need to proceed with a transmetatarsal amputation.  Risks and benefits were discussed including risk of the wound not healing need for higher level amputation.  Patient states he understands wished to proceed at this time we will need time to get patient ready for surgery he will need a Covid test in [place] he will need to change his dialysis to Tuesday so we could proceed with surgery on Wednesday and set this up as outpatient surgery with discharge with a portable Praveena wound VAC pump.  Follow-Up Instructions: Return in about 2 weeks (around 07/29/2019).   Ortho Exam  Patient is alert, oriented, no adenopathy, well-dressed, normal affect, normal respiratory effort. Examination patient does not have palpable pulses his left  lower extremity has dry gangrenous changes of the toes 234 and 5.  A Doppler was used and patient has a good biphasic dorsalis pedis pulse I cannot Doppler a posterior tibial pulse.  His right transmetatarsal amputation is healed well he does have some plantar callus but no open ulcers no cellulitis in either foot there is no swelling in either lower extremity.  Patient has no pain with weightbearing on the right hip.  Imaging: XR HIP UNILAT W OR W/O PELVIS 2-3 VIEWS RIGHT  Result Date: 07/15/2019 2 view radiographs of the right hip shows severe calcification of the femoral vessels.  Patient has a nondisplaced avulsion fracture off the greater trochanter no femoral neck or intertrochanteric hip fractures  No images are attached to the encounter.  Labs: Lab Results  Component Value Date   REPTSTATUS 06/17/2018 FINAL 06/12/2018   GRAMSTAIN  02/18/2017    RARE WBC PRESENT, PREDOMINANTLY PMN FEW GRAM POSITIVE COCCI    CULT  06/12/2018    NO GROWTH 5 DAYS Performed at [facility], [address] [place], [place] [address]      Lab Results  Component Value Date   ALBUMIN 2.5 (L) 07/01/2019   ALBUMIN 3.7 05/28/2019   ALBUMIN 2.8 (L) 12/02/2018    Lab Results  Component Value Date   MG 2.0 06/17/2018  MG 1.9 06/14/2018   MG 1.8 06/13/2018   No results found for: VD25OH  No results found for: PREALBUMIN CBC EXTENDED Latest Ref Rng & Units 07/01/2019 06/30/2019 05/28/2019  WBC 4.0 - 10.5 K/uL 13.3(H) 12.7(H) 13.5(H)  RBC 4.22 - 5.81 MIL/uL 6.38(H) 7.01(H) 7.77(H)  HGB 13.0 - 17.0 g/dL 13.6 15.1 16.8  HCT 39.0 - 52.0 % 42.1 46.7 53.0(H)  PLT 150 - 400 K/uL 264 253 324  NEUTROABS 1.7 - 7.7 K/uL - 10.6(H) 6.5  LYMPHSABS 0.7 - 4.0 K/uL - 1.2 5.2(H)     Body mass index is 24.3 kg/m.  Orders:  Orders Placed This Encounter  Procedures  . XR HIP UNILAT W OR W/O PELVIS 2-3 VIEWS RIGHT   No orders of the defined types were placed in this encounter.    Procedures: No procedures  performed  Clinical Data: No additional findings.  ROS:  All other systems negative, except as noted in the HPI. Review of Systems  Objective: Vital Signs: Ht 5\' 5"  (1.651 m)   Wt 146 lb (66.2 kg)   BMI 24.30 kg/m   Specialty Comments:  No specialty comments available.  PMFS History: Patient Active Problem List   Diagnosis Date Noted  . Fall   . Closed avulsion fracture of right hip ([place]) 06/30/2019  . Closed intertrochanteric fracture of right hip ([place]) 06/30/2019  . Hip fracture ([place]) 06/30/2019  . Severe protein-calorie malnutrition ([place]) 06/18/2018  . Severe sepsis ([place]) 06/12/2018  . Leukocytosis 06/12/2018  . MRSA (methicillin resistant staph aureus) culture positive 06/12/2018  . Essential hypertension 06/12/2018  . ESRD (end stage renal disease) ([place]) 06/09/2018  . Gangrene of finger ([place]) 06/04/2018  . S/P transmetatarsal amputation of foot, right ([place]) 05/09/2017  . PVD (peripheral vascular disease) ([place]) 04/15/2017  . Nonischemic cardiomyopathy ([place]) 08/15/2015  . Aortic stenosis 08/15/2015  . Moderate aortic stenosis 07/12/2015  . 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 CAD- 40-50% LAD at cath 07/11/15 07/11/2015  . Aspirin intolerance 07/11/2015  . Abnormal stress test   . ESRD on dialysis ([place]) 05/30/2015  . CTS (carpal tunnel syndrome) 02/28/2015  . PVD of LE - Dr Oneida Alar follows 08/20/2012  . Midfoot skin ulcer, right, limited to breakdown of skin ([place]) 01/16/2012  . Encounter for screening colonoscopy 10/08/2011  . Melena 10/08/2011  . Other complications due to renal dialysis device, implant, and graft 09/24/2011   Past Medical History:  Diagnosis Date  . Anxiety   . Aortic stenosis   . Arthritis    left hand  . CVA (cerebral infarction)   . ESRD (end stage renal disease) on dialysis [facility])    M/W/F at [facility] in [place]  . Essential hypertension    resolved with HD  . Gangrene of right foot ([place])   . Gastric ulcer 2004  .  GI bleed    gastric ulcer  . Heart murmur   . History of blood transfusion   . History of cardiomyopathy    LVEF normal as of February 2017  . History of gastric ulcer   . History of stroke    Left side weakness  . Iron deficiency anemia   . Peripheral vascular disease ([place])   . Stroke [facility]) 2000   limp left    Family History  Problem Relation Age of Onset  . Hypertension Mother   . Colon cancer Neg Hx   . Liver disease Neg Hx     Past Surgical History:  Procedure Laterality Date  .  ABDOMINAL AORTAGRAM N/A 01/24/2012   Procedure: ABDOMINAL Maxcine Ham;  Surgeon: Elam Dutch, MD;  Location: [facility] CATH LAB;  Service: Cardiovascular;  Laterality: N/A;  . ABDOMINAL AORTOGRAM W/LOWER EXTREMITY N/A 04/15/2017   Procedure: ABDOMINAL AORTOGRAM W/LOWER EXTREMITY;  Surgeon: Angelia Mould, MD;  Location: [place] CV LAB;  Service: Cardiovascular;  Laterality: N/A;  . AMPUTATION Right 05/09/2017   Procedure: RIGHT TRANSMETATARSAL AMPUTATION;  Surgeon: [name], MD;  Location: [place];  Service: Orthopedics;  Laterality: Right;  . AMPUTATION Right 06/13/2018   Procedure: AMPUTATION RIGHT LONG FINGER;  Surgeon: Dayna Barker, MD;  Location: [place];  Service: Plastics;  Laterality: Right;  . ARTERIOVENOUS GRAFT PLACEMENT Right right arm  . AV FISTULA PLACEMENT Left 08/31/2015   Procedure: ARTERIOVENOUS (AV) FISTULA CREATION- LEFT RADIOCEPHALIC;  Surgeon: Mal Misty, MD;  Location: [place];  Service: Vascular;  Laterality: Left;  . AV FISTULA PLACEMENT Right 02/18/2017   Procedure: INSERTION OF ARTERIOVENOUS (AV) GORE-TEX GRAFT  RIGHT UPPER ARM;  Surgeon: Conrad [place], MD;  Location: [place];  Service: Vascular;  Laterality: Right;  . AV FISTULA PLACEMENT Left 12/01/2018   Procedure: INSERTION OF ARTERIOVENOUS (AV) GORE-TEX GRAFT ARM;  Surgeon: Angelia Mould, MD;  Location: [place];  Service: Vascular;  Laterality: Left;  . AV FISTULA PLACEMENT Left 02/09/2019   Procedure:  INSERTION OF ARTERIOVENOUS (AV) GORE-TEX GRAFT LEFT THIGH;  Surgeon: Waynetta Sandy, MD;  Location: [place];  Service: Vascular;  Laterality: Left;  . BASCILIC VEIN TRANSPOSITION Left 12/19/2015   Procedure: FIRST STAGE BRACHIAL VEIN TRANSPOSITION;  Surgeon: Conrad Atwood, MD;  Location: [place];  Service: Vascular;  Laterality: Left;  . BASCILIC VEIN TRANSPOSITION Left 02/08/2016   Procedure: SECOND STAGE BRACHIAL VEIN TRANSPOSITION;  Surgeon: Conrad [place], MD;  Location: [place];  Service: Vascular;  Laterality: Left;  . CARDIAC CATHETERIZATION N/A 07/11/2015   Procedure: Left Heart Cath and Coronary Angiography;  Surgeon: Troy Sine, MD;  Location: [place] CV LAB;  Service: Cardiovascular;  Laterality: N/A;  . Carpel Tunnel Left Dec. 22, 2016  . CHOLECYSTECTOMY    . COLONOSCOPY  2004   Dr. Irving Shows, left sided diverticula and cecal polyp, path unknown  . COLONOSCOPY  10/29/2011   Procedure: COLONOSCOPY;  Surgeon: Daneil Dolin, MD;  [facility];  Service: Endoscopy;  Laterality: N/A;  10:15  . ESOPHAGOGASTRODUODENOSCOPY  11/2002   Dr. Gala Romney, erosive reflux esophagitis, multiple gastric ulcer and antral/bulbar erosions. Serologies positive for H.Pylori and was treated  . ESOPHAGOGASTRODUODENOSCOPY  11/20014   Dr. Gala Romney, small hh only, ulcers healed  . ESOPHAGOGASTRODUODENOSCOPY  09/21/2011   Dr Trevor Iha [facility], antral erosions, ?early GAVE  . FISTULOGRAM Left 12/10/2016   Procedure: THROMBECTOMY OF LEFT ARM ARTERIOVENOUS FISTULA;  Surgeon: Waynetta Sandy, MD;  Location: [place];  Service: Vascular;  Laterality: Left;  . INSERTION OF DIALYSIS CATHETER Left 12/10/2016   Procedure: INSERTION OF TUNNELED DIALYSIS CATHETER;  Surgeon: Waynetta Sandy, MD;  Location: [place];  Service: Vascular;  Laterality: Left;  . INSERTION OF DIALYSIS CATHETER Right 06/09/2018   Procedure: INSERTION OF DIALYSIS CATHETER Right subclavian;  Surgeon: Angelia Mould, MD;   Location: [place];  Service: Vascular;  Laterality: Right;  . IR DIALY SHUNT INTRO [place] W/IMG RIGHT Right 01/01/2018  . IR GENERIC HISTORICAL  07/16/2016   IR REMOVAL TUN CV CATH W/O FL 07/16/2016 Saverio Danker, PA-C [facility]-INTERV RAD  . IR PTA ADDL CENTRAL  DIALYSIS SEG THRU DIALY CIRCUIT RIGHT Right 10/21/2017  . IR REMOVAL TUN CV CATH W/O FL  05/12/2017  . IR THROMBECTOMY AV FISTULA W/THROMBOLYSIS/PTA INC/SHUNT/IMG RIGHT Right 10/21/2017  . IR THROMBECTOMY AV FISTULA W/THROMBOLYSIS/PTA INC/SHUNT/IMG RIGHT Right 11/27/2017  . IR US GUIDE VASC ACCESS RIGHT  10/21/2017  . IR US GUIDE VASC ACCESS RIGHT  11/27/2017  . IR US GUIDE VASC ACCESS RIGHT  01/01/2018  . LIGATION ARTERIOVENOUS GORTEX GRAFT Right 06/09/2018  . LIGATION ARTERIOVENOUS GORTEX GRAFT Right 06/09/2018   Procedure: LIGATION ARTERIOVENOUS GORTEX GRAFT RIGHT ARM;  Surgeon: Angelia Mould, MD;  Location: [place];  Service: Vascular;  Laterality: Right;  . LIGATION ARTERIOVENOUS GORTEX GRAFT Left 12/31/2018   Procedure: LIGATION OF LEFT UPPER ARM ARTERIVENOUS GORTEX GRAFT, LEFT BRACHIAL ARTERY ENDARTERECTOMY WITH BOVINE PATCH ANGIOPLASTY;  Surgeon: Waynetta Sandy, MD;  Location: [place];  Service: Vascular;  Laterality: Left;  . LIGATION OF ARTERIOVENOUS  FISTULA Left 12/19/2015   Procedure: LIGATION OF RADIOCEPHALIC ARTERIOVENOUS  FISTULA;  Surgeon: Conrad Salamatof, MD;  Location: [place];  Service: Vascular;  Laterality: Left;  Marland Kitchen MASS EXCISION Right 02/18/2017   Procedure: EXCISION OF RIGHT AXILLARY EPIDERMAL INCLUSION CYST;  Surgeon: Conrad Hurley, MD;  Location: [place];  Service: Vascular;  Laterality: Right;  . PERIPHERAL VASCULAR CATHETERIZATION N/A 12/14/2015   Procedure: Fistulagram;  Surgeon: Conrad [place], MD;  Location: [place] CV LAB;  Service: Cardiovascular;  Laterality: N/A;  . SHOULDER SURGERY Right    fracture  . ULTRASOUND GUIDANCE FOR VASCULAR ACCESS  04/15/2017   Procedure: Ultrasound Guidance For  Vascular Access;  Surgeon: Angelia Mould, MD;  Location: [place] CV LAB;  Service: Cardiovascular;;  . UPPER EXTREMITY VENOGRAPHY Bilateral 12/17/2016   Procedure: Bilateral Upper Extremity Venography;  Surgeon: Serafina Mitchell, MD;  Location: [place] CV LAB;  Service: Cardiovascular;  Laterality: Bilateral;  . UPPER EXTREMITY VENOGRAPHY Left 11/03/2018   Procedure: UPPER EXTREMITY VENOGRAPHY;  Surgeon: Serafina Mitchell, MD;  Location: [place] CV LAB;  Service: Cardiovascular;  Laterality: Left;   Social History   Occupational History  . Occupation: retired, Licensed conveyancer    Employer: RETIRED  Tobacco Use  . Smoking status: Former Smoker    Years: 25.00    Quit date: 04/29/2004    Years since quitting: 15.2  . Smokeless tobacco: Former Systems developer    Types: Chew    Quit date: 01/16/1987  . Tobacco comment: quit 2006  Substance and Sexual Activity  . Alcohol use: No  . Drug use: No  . Sexual activity: Not on file

## 2019-07-16 ENCOUNTER — Telehealth: Payer: Self-pay | Admitting: Orthopedic Surgery

## 2019-07-16 DIAGNOSIS — G479 Sleep disorder, unspecified: Secondary | ICD-10-CM | POA: Diagnosis not present

## 2019-07-16 NOTE — Telephone Encounter (Signed)
[name] from [facility] called needing an order that states the patient is weight bearing as tolerated.  CB#[phone] or [phone].  Thank you.

## 2019-07-16 NOTE — Telephone Encounter (Signed)
Called and sw [place] to advise that per the dictation from yesterday pt may be wtbat.

## 2019-07-19 DIAGNOSIS — Z992 Dependence on renal dialysis: Secondary | ICD-10-CM | POA: Diagnosis not present

## 2019-07-19 DIAGNOSIS — N2581 Secondary hyperparathyroidism of renal origin: Secondary | ICD-10-CM | POA: Diagnosis not present

## 2019-07-19 DIAGNOSIS — D509 Iron deficiency anemia, unspecified: Secondary | ICD-10-CM | POA: Diagnosis not present

## 2019-07-19 DIAGNOSIS — N186 End stage renal disease: Secondary | ICD-10-CM | POA: Diagnosis not present

## 2019-07-20 ENCOUNTER — Other Ambulatory Visit: Payer: Self-pay

## 2019-07-20 ENCOUNTER — Encounter (HOSPITAL_COMMUNITY): Payer: Self-pay | Admitting: Orthopedic Surgery

## 2019-07-20 ENCOUNTER — Other Ambulatory Visit (HOSPITAL_COMMUNITY)
Admission: RE | Admit: 2019-07-20 | Discharge: 2019-07-20 | Disposition: A | Discharge status: Home / Self Care | Payer: Medicare Other | Source: Ambulatory Visit | Attending: Orthopedic Surgery | Admitting: Orthopedic Surgery

## 2019-07-20 DIAGNOSIS — M19042 Primary osteoarthritis, left hand: Secondary | ICD-10-CM | POA: Diagnosis not present

## 2019-07-20 DIAGNOSIS — Z89421 Acquired absence of other right toe(s): Secondary | ICD-10-CM | POA: Diagnosis not present

## 2019-07-20 DIAGNOSIS — I69354 Hemiplegia and hemiparesis following cerebral infarction affecting left non-dominant side: Secondary | ICD-10-CM | POA: Diagnosis not present

## 2019-07-20 DIAGNOSIS — R2681 Unsteadiness on feet: Secondary | ICD-10-CM | POA: Diagnosis not present

## 2019-07-20 DIAGNOSIS — I428 Other cardiomyopathies: Secondary | ICD-10-CM | POA: Diagnosis not present

## 2019-07-20 DIAGNOSIS — Z20822 Contact with and (suspected) exposure to covid-19: Secondary | ICD-10-CM | POA: Diagnosis not present

## 2019-07-20 DIAGNOSIS — I70261 Atherosclerosis of native arteries of extremities with gangrene, right leg: Secondary | ICD-10-CM | POA: Diagnosis not present

## 2019-07-20 DIAGNOSIS — I120 Hypertensive chronic kidney disease with stage 5 chronic kidney disease or end stage renal disease: Secondary | ICD-10-CM | POA: Diagnosis not present

## 2019-07-20 DIAGNOSIS — Z8249 Family history of ischemic heart disease and other diseases of the circulatory system: Secondary | ICD-10-CM | POA: Diagnosis not present

## 2019-07-20 DIAGNOSIS — D649 Anemia, unspecified: Secondary | ICD-10-CM | POA: Diagnosis not present

## 2019-07-20 DIAGNOSIS — M6281 Muscle weakness (generalized): Secondary | ICD-10-CM | POA: Diagnosis not present

## 2019-07-20 DIAGNOSIS — N186 End stage renal disease: Secondary | ICD-10-CM | POA: Diagnosis not present

## 2019-07-20 DIAGNOSIS — N2581 Secondary hyperparathyroidism of renal origin: Secondary | ICD-10-CM | POA: Diagnosis not present

## 2019-07-20 DIAGNOSIS — M86172 Other acute osteomyelitis, left ankle and foot: Secondary | ICD-10-CM | POA: Diagnosis not present

## 2019-07-20 DIAGNOSIS — Z7902 Long term (current) use of antithrombotics/antiplatelets: Secondary | ICD-10-CM | POA: Diagnosis not present

## 2019-07-20 DIAGNOSIS — E8889 Other specified metabolic disorders: Secondary | ICD-10-CM | POA: Diagnosis not present

## 2019-07-20 DIAGNOSIS — Z992 Dependence on renal dialysis: Secondary | ICD-10-CM | POA: Diagnosis not present

## 2019-07-20 DIAGNOSIS — Z79899 Other long term (current) drug therapy: Secondary | ICD-10-CM | POA: Diagnosis not present

## 2019-07-20 DIAGNOSIS — Z87891 Personal history of nicotine dependence: Secondary | ICD-10-CM | POA: Diagnosis not present

## 2019-07-20 DIAGNOSIS — Z95828 Presence of other vascular implants and grafts: Secondary | ICD-10-CM | POA: Diagnosis not present

## 2019-07-20 DIAGNOSIS — I350 Nonrheumatic aortic (valve) stenosis: Secondary | ICD-10-CM | POA: Diagnosis not present

## 2019-07-20 DIAGNOSIS — Z886 Allergy status to analgesic agent status: Secondary | ICD-10-CM | POA: Diagnosis not present

## 2019-07-20 LAB — SARS CORONAVIRUS 2 (TAT 6-24 HRS): SARS Coronavirus 2: NEGATIVE

## 2019-07-20 NOTE — Progress Notes (Signed)
SDW pre-op call completed by pt nurse [place], [place], at [facility]. Nurse unable to complete anesthesia assessment and Apnea Screening; please have pt complete both on DOS. Nurse confirmed receipt of fax and verbalized understanding of all pre-op instructions.

## 2019-07-20 NOTE — Anesthesia Preprocedure Evaluation (Addendum)
Anesthesia Evaluation  Patient identified by MRN, date of birth, ID band Patient awake    Reviewed: Allergy & Precautions, NPO status , Patient's Chart, lab work & pertinent test results  Airway Mallampati: II  TM Distance: >3 FB Neck ROM: Full    Dental no notable dental hx. (+) Edentulous Lower, Edentulous Upper   Pulmonary former smoker,    Pulmonary exam normal breath sounds clear to auscultation       Cardiovascular hypertension, + Past MI and + Peripheral Vascular Disease  Normal cardiovascular exam+ Valvular Problems/Murmurs AS  Rhythm:Regular Rate:Normal  12/08/18 Echo  . The left ventricle has normal systolic function, with an ejection fraction of 55-60%. The cavity size was normal. There is moderately increased left ventricular wall thickness . Moderate to severe aortic stenosis. The highest gradient is noted with the pedhoff probe. Mean gradient 38 mmHg, dimensionless inex 0.28, AVA VTI 0.84.  MI 2000   Neuro/Psych Anxiety L sided weakness CVA, Residual Symptoms    GI/Hepatic   Endo/Other  negative endocrine ROS  Renal/GU ESRF and DialysisRenal diseaseMWF Dialysis     Musculoskeletal negative musculoskeletal ROS (+)   Abdominal   Peds  Hematology  (+) anemia , Hgb 13.4   Anesthesia Other Findings   Reproductive/Obstetrics                            Anesthesia Physical Anesthesia Plan  ASA: IV  Anesthesia Plan: Regional   Post-op Pain Management:    Induction: Intravenous  PONV Risk Score and Plan: 3 and Treatment may vary due to age or medical condition, Ondansetron and Dexamethasone  Airway Management Planned: Nasal Cannula and Natural Airway  Additional Equipment: None  Intra-op Plan:   Post-operative Plan:   Informed Consent: I have reviewed the patients History and Physical, chart, labs and discussed the procedure including the risks, benefits and alternatives  for the proposed anesthesia with the patient or authorized representative who has indicated his/her understanding and acceptance.     Dental advisory given  Plan Discussed with:   Anesthesia Plan Comments: (  PAT note written 07/20/2019 by Myra Gianotti, PA-C.  Echo 12/08/18: IMPRESSIONS 1. The left ventricle has normal systolic function, with an ejection fraction of 55-60%. The cavity size was normal. There is moderately increased left ventricular wall thickness. Left ventricular diastolic Doppler parameters are consistent with  impaired relaxation. Elevated mean left atrial pressure. 2. The right ventricle has normal systolic function. The cavity was normal. There is no increase in right ventricular wall thickness. 3. Left atrial size was severely dilated. 4. Mild thickening of the mitral valve leaflet. Mild calcification of the mitral valve leaflet. There is moderate mitral annular calcification present. No evidence of mitral valve stenosis. 5. The aortic valve has an indeterminate number of cusps. Moderate thickening of the aortic valve. Moderate calcification of the aortic valve. Aortic valve regurgitation is moderate by color flow Doppler. Moderate-severe stenosis of the aortic valve.  Moderate aortic annular calcification noted. 6. Moderate to severe aortic stenosis. The highest gradient is noted with the pedhoff probe. Mean gradient 38 mmHg, dimensionless inex 0.28, AVA VTI 0.84. 7. The aorta is normal in size and structure. 8. Pulmonary hypertension is indeterminate, inadequate TR jet. 9. The interatrial septum was not well visualized. (Comparison echo 06/27/15 showed moderate AS.Valve area (VTI): 0.89 cm^2. Valve area (Vmax):1.02 cm^2. Valve area (Vmean): 0.91 cm^2. Follow-up scheduled 08/24/19.)    Cardiac cath 07/11/15 (done for abnormal  06/27/15 stress test, EF 36%, apical-inferior perfusion defect, inferior hypokinesis): Prox LAD lesion, 45% stenosed. Mac w L Pop    There is moderate to severe left ventricular systolic dysfunction. -Nonischemic cardiomyopathy with global hypokinesis with more pronounced inferior hypocontractility and overall ejection fraction at 35%. -Mild coronary calcification with 40-50% eccentric proximal LAD stenosis just prior to a septal perforating artery; mild calcification of the proximal left circumflex coronary artery; and large dominant RCA without significant obstructive stenoses. -Probably at least mild aortic stenosis with LV to AO gradient ranging from 10 -18 mm Hg in this patient with reduced LV function; LVEDP 13 mm Hg.  RECOMMENDATION: Medical therapy for his cardiomyopathy and mild CAD. Consider a dobutamine stress echo if concern for low gradient AS with reduced LV function.  )     Anesthesia Quick Evaluation

## 2019-07-20 NOTE — Pre-Procedure Instructions (Addendum)
    [name]  07/20/2019    Your procedure is scheduled on  Wednesday, July 21, 2019   Report to [facility] Admitting at 5:30 A.M.   Call this number if you have problems the morning of surgery:  [phone]   Remember: Brush your teeth the morning of surgery with your regular toothpaste.   Do not eat or drink after midnight tonight.   Take these medicines the morning of surgery with A SIP OF WATER : None   Stop taking , vitamins, fish oil and herbal medications. Do not take any NSAIDs ie: Ibuprofen, Advil, Naproxen (Aleve), Motrin, BC and Goody Powder; stop now.   Pt is to continue taking Plavix per Malachy Mood, Surgical Coordinator.    Do not wear jewelry, lotions, powders, colognes, or deodorant.  Do not shave 48 hours prior to surgery.  Men may shave face and neck.  Do not bring valuables to the hospital.  [facility] is not responsible for any belongings or valuables.  Contacts, dentures or bridgework may not be worn into surgery.  Leave your suitcase in the car.  After surgery it may be brought to your room.  For patients admitted to the hospital, discharge time will be determined by your treatment team.  Patients discharged the day of surgery will not be allowed to drive home  Please read over the following fact sheets that you were given.

## 2019-07-20 NOTE — H&P (Addendum)
Joseph KATEN Sr. is an 79 y.o. male.   Chief Complaint: left foot osteomyelitis HPI: Patient is a 79 year old gentleman who skilled nursing resident in [place].  He is status post a right transmetatarsal amputation about 2 years ago and presents with black gangrenous changes of the toes of the left foot.  He is also status post a fall March 3 with an avulsion off the greater trochanter.  Patient states that his hip is asymptomatic.  Past Medical History:  Diagnosis Date  . Anxiety   . Aortic stenosis   . Arthritis    left hand  . CVA (cerebral infarction)   . ESRD (end stage renal disease) on dialysis [facility])    M/W/F at [facility] in [place]  . Essential hypertension    resolved with HD  . Gangrene of right foot ([place])   . Gastric ulcer 2004  . GI bleed    gastric ulcer  . Heart murmur   . History of blood transfusion   . History of cardiomyopathy    LVEF normal as of February 2017  . History of gastric ulcer   . History of stroke    Left side weakness  . Iron deficiency anemia   . Peripheral vascular disease ([place])   . Stroke [facility]) 2000   limp left    Past Surgical History:  Procedure Laterality Date  . ABDOMINAL AORTAGRAM N/A 01/24/2012   Procedure: ABDOMINAL Maxcine Ham;  Surgeon: Elam Dutch, MD;  Location: [facility] CATH LAB;  Service: Cardiovascular;  Laterality: N/A;  . ABDOMINAL AORTOGRAM W/LOWER EXTREMITY N/A 04/15/2017   Procedure: ABDOMINAL AORTOGRAM W/LOWER EXTREMITY;  Surgeon: Angelia Mould, MD;  Location: [place] CV LAB;  Service: Cardiovascular;  Laterality: N/A;  . AMPUTATION Right 05/09/2017   Procedure: RIGHT TRANSMETATARSAL AMPUTATION;  Surgeon: [name], MD;  Location: [place];  Service: Orthopedics;  Laterality: Right;  . AMPUTATION Right 06/13/2018   Procedure: AMPUTATION RIGHT LONG FINGER;  Surgeon: Dayna Barker, MD;  Location: [place];  Service: Plastics;  Laterality: Right;  . ARTERIOVENOUS GRAFT PLACEMENT Right right arm  . AV FISTULA  PLACEMENT Left 08/31/2015   Procedure: ARTERIOVENOUS (AV) FISTULA CREATION- LEFT RADIOCEPHALIC;  Surgeon: Mal Misty, MD;  Location: [place];  Service: Vascular;  Laterality: Left;  . AV FISTULA PLACEMENT Right 02/18/2017   Procedure: INSERTION OF ARTERIOVENOUS (AV) GORE-TEX GRAFT  RIGHT UPPER ARM;  Surgeon: Conrad Rudolph, MD;  Location: [place];  Service: Vascular;  Laterality: Right;  . AV FISTULA PLACEMENT Left 12/01/2018   Procedure: INSERTION OF ARTERIOVENOUS (AV) GORE-TEX GRAFT ARM;  Surgeon: Angelia Mould, MD;  Location: [place];  Service: Vascular;  Laterality: Left;  . AV FISTULA PLACEMENT Left 02/09/2019   Procedure: INSERTION OF ARTERIOVENOUS (AV) GORE-TEX GRAFT LEFT THIGH;  Surgeon: Waynetta Sandy, MD;  Location: [place];  Service: Vascular;  Laterality: Left;  . BASCILIC VEIN TRANSPOSITION Left 12/19/2015   Procedure: FIRST STAGE BRACHIAL VEIN TRANSPOSITION;  Surgeon: Conrad Movico, MD;  Location: [place];  Service: Vascular;  Laterality: Left;  . BASCILIC VEIN TRANSPOSITION Left 02/08/2016   Procedure: SECOND STAGE BRACHIAL VEIN TRANSPOSITION;  Surgeon: Conrad , MD;  Location: [place];  Service: Vascular;  Laterality: Left;  . CARDIAC CATHETERIZATION N/A 07/11/2015   Procedure: Left Heart Cath and Coronary Angiography;  Surgeon: Troy Sine, MD;  Location: [place] CV LAB;  Service: Cardiovascular;  Laterality: N/A;  . Carpel Tunnel Left Dec. 22, 2016  . CHOLECYSTECTOMY    .  COLONOSCOPY  2004   Dr. Irving Shows, left sided diverticula and cecal polyp, path unknown  . COLONOSCOPY  10/29/2011   Procedure: COLONOSCOPY;  Surgeon: Daneil Dolin, MD;  [facility];  Service: Endoscopy;  Laterality: N/A;  10:15  . ESOPHAGOGASTRODUODENOSCOPY  11/2002   Dr. Gala Romney, erosive reflux esophagitis, multiple gastric ulcer and antral/bulbar erosions. Serologies positive for H.Pylori and was treated  . ESOPHAGOGASTRODUODENOSCOPY  11/20014   Dr. Gala Romney, small hh only, ulcers  healed  . ESOPHAGOGASTRODUODENOSCOPY  09/21/2011   Dr Trevor Iha [facility], antral erosions, ?early GAVE  . FISTULOGRAM Left 12/10/2016   Procedure: THROMBECTOMY OF LEFT ARM ARTERIOVENOUS FISTULA;  Surgeon: Waynetta Sandy, MD;  Location: [place];  Service: Vascular;  Laterality: Left;  . INSERTION OF DIALYSIS CATHETER Left 12/10/2016   Procedure: INSERTION OF TUNNELED DIALYSIS CATHETER;  Surgeon: Waynetta Sandy, MD;  Location: [place];  Service: Vascular;  Laterality: Left;  . INSERTION OF DIALYSIS CATHETER Right 06/09/2018   Procedure: INSERTION OF DIALYSIS CATHETER Right subclavian;  Surgeon: Angelia Mould, MD;  Location: [place];  Service: Vascular;  Laterality: Right;  . IR DIALY SHUNT INTRO [place] W/IMG RIGHT Right 01/01/2018  . IR GENERIC HISTORICAL  07/16/2016   IR REMOVAL TUN CV CATH W/O FL 07/16/2016 Saverio Danker, PA-C [facility]-INTERV RAD  . IR PTA ADDL CENTRAL DIALYSIS SEG THRU DIALY CIRCUIT RIGHT Right 10/21/2017  . IR REMOVAL TUN CV CATH W/O FL  05/12/2017  . IR THROMBECTOMY AV FISTULA W/THROMBOLYSIS/PTA INC/SHUNT/IMG RIGHT Right 10/21/2017  . IR THROMBECTOMY AV FISTULA W/THROMBOLYSIS/PTA INC/SHUNT/IMG RIGHT Right 11/27/2017  . IR US GUIDE VASC ACCESS RIGHT  10/21/2017  . IR US GUIDE VASC ACCESS RIGHT  11/27/2017  . IR US GUIDE VASC ACCESS RIGHT  01/01/2018  . LIGATION ARTERIOVENOUS GORTEX GRAFT Right 06/09/2018  . LIGATION ARTERIOVENOUS GORTEX GRAFT Right 06/09/2018   Procedure: LIGATION ARTERIOVENOUS GORTEX GRAFT RIGHT ARM;  Surgeon: Angelia Mould, MD;  Location: [place];  Service: Vascular;  Laterality: Right;  . LIGATION ARTERIOVENOUS GORTEX GRAFT Left 12/31/2018   Procedure: LIGATION OF LEFT UPPER ARM ARTERIVENOUS GORTEX GRAFT, LEFT BRACHIAL ARTERY ENDARTERECTOMY WITH BOVINE PATCH ANGIOPLASTY;  Surgeon: Waynetta Sandy, MD;  Location: [place];  Service: Vascular;  Laterality: Left;  . LIGATION OF ARTERIOVENOUS  FISTULA Left 12/19/2015   Procedure:  LIGATION OF RADIOCEPHALIC ARTERIOVENOUS  FISTULA;  Surgeon: Conrad Smithfield, MD;  Location: [place];  Service: Vascular;  Laterality: Left;  Marland Kitchen MASS EXCISION Right 02/18/2017   Procedure: EXCISION OF RIGHT AXILLARY EPIDERMAL INCLUSION CYST;  Surgeon: Conrad G[place]onsville, MD;  Location: [place];  Service: Vascular;  Laterality: Right;  . PERIPHERAL VASCULAR CATHETERIZATION N/A 12/14/2015   Procedure: Fistulagram;  Surgeon: Conrad Alcolu, MD;  Location: [place] CV LAB;  Service: Cardiovascular;  Laterality: N/A;  . SHOULDER SURGERY Right    fracture  . ULTRASOUND GUIDANCE FOR VASCULAR ACCESS  04/15/2017   Procedure: Ultrasound Guidance For Vascular Access;  Surgeon: Angelia Mould, MD;  Location: [place] CV LAB;  Service: Cardiovascular;;  . UPPER EXTREMITY VENOGRAPHY Bilateral 12/17/2016   Procedure: Bilateral Upper Extremity Venography;  Surgeon: Serafina Mitchell, MD;  Location: [place] CV LAB;  Service: Cardiovascular;  Laterality: Bilateral;  . UPPER EXTREMITY VENOGRAPHY Left 11/03/2018   Procedure: UPPER EXTREMITY VENOGRAPHY;  Surgeon: Serafina Mitchell, MD;  Location: [place] CV LAB;  Service: Cardiovascular;  Laterality: Left;    Family History  Problem Relation Age of Onset  .  Hypertension Mother   . Colon cancer Neg Hx   . Liver disease Neg Hx    Social History:  reports that he quit smoking about 15 years ago. He quit after 25.00 years of use. He quit smokeless tobacco use about 32 years ago.  His smokeless tobacco use included chew. He reports that he does not drink alcohol or use drugs.  Allergies:  Allergies  Allergen Reactions  . Aspirin Other (See Comments)    Causes internal bleeding  History of ulcers    No medications prior to admission.    No results found for this or any previous visit (from the past 48 hour(s)). No results found.  Review of Systems  All other systems reviewed and are negative.   There were no vitals taken for this visit. Physical Exam   Patient is alert, oriented, no adenopathy, well-dressed, normal affect, normal respiratory effort. Examination patient does not have palpable pulses his left lower extremity has dry gangrenous changes of the toes 234 and 5.  A Doppler was used and patient has a good biphasic dorsalis pedis pulse I cannot Doppler a posterior tibial pulse.  His right transmetatarsal amputation is healed well he does have some plantar callus but no open ulcers no cellulitis in either foot there is no swelling in either lower extremity.  Patient has no pain with weightbearing on the right hip.Heart RRR Lungs Clear Assessment/Plan Plan: Patient will continue weightbearing as tolerated on the right lower extremity no displacement of the greater trochanteric hip fracture.  Patient does have progressive dry gangrenous changes of his toes we will need to proceed with a transmetatarsal amputation.  Risks and benefits were discussed including risk of the wound not healing need for higher level amputation.  Patient states he understands wished to proceed at this time we will need time to get patient ready for surgery he will need a Covid test in [place] he will need to change his dialysis to Tuesday so we could proceed with surgery on Wednesday and set this up as outpatient surgery with discharge with a portable Praveena wound VAC pump.  Bevely Palmer Persons, PA 07/20/2019, 7:11 AM  No change in patient's history and physical that was complete yesterday.  Plan for a left transmetatarsal amputation today.  Plan for admission today we will consult nephrology for dialysis with plan to return to skilled nursing facility.

## 2019-07-20 NOTE — Progress Notes (Signed)
PA, Anesthesiology, asked to review pt history. 

## 2019-07-20 NOTE — Progress Notes (Signed)
Pt nurse, [place], LPN, denies that pt C/O SOB and chest pain. Nurse is not aware if pt is under the care of a cardiologist.

## 2019-07-20 NOTE — Progress Notes (Signed)
Anesthesia Chart Review: [name]   Case: [id number] Date/Time: 07/21/19 0715   Procedure: LEFT TRANSMETATARSAL AMPUTATION (Left )   Anesthesia type: Choice   Pre-op diagnosis: Gangrene Left Foot   Location: [facility] OR ROOM 03 / [place] [facility]   Surgeons: [name], MD      DISCUSSION: Patient is a 79 year old male scheduled for the above procedure.He is currently a resident at [facility] following 06/30/19-07/04/19 admission for fall with non-displaced right greater trochanter fracture. Dr. Sharol Given consulted and no surgical intervention recommended for hip fracture with out-patient follow-up for that as well as left gangrenous toes. Left TMA was recommended at 07/15/19 follow-up. Dr. Sharol Given asked him to arrange hemodialysis on the Tuesday before surgery.   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  Patient as last seen by Dr. Domenic Polite on 01/22/19 for cardiac follow-up. He was not having any worsening intermittent lightheadedness and no syncope, chest pain, or palpitations. On midodrine for hypotension with dialysis. Recent ZioPatch showed occasional ectopy with brief episodes of PSVT and a single episode of NSVT, but not sustained arrhythmias or pauses. August 2020 echo showed moderate-severe AS with plans to re-evaluate in six months. (Not felt to be a good surgical candidate, but potentially could be considered for TAVR if progresses to severe AS.)  His follow-up visit with echo is scheduled for 08/24/19.  He currently  resides at [facility] while getting rehab for hip fracture. No reports of chest pain and SOB. His normal hemodialysis days are MWF. He does have moderate to severe AS by 11/2018 with plans for follow-up 08/24/19. Unfortunately, he now has gangrenous left toes and needs transmetatarsal amputation. Anesthesia is posted as Choice. He is on Plavix per medication list. 07/20/19. COVID-19 test is in process. He is a same day work-up, so he will get labs and further evaluation by his anesthesia team on the day of surgery. Reviewed AS history with anesthesiologist Suzette Battiest, MD.   VS:  BP Readings from Last 3 Encounters:  07/04/19 119/68  05/28/19 114/65  03/11/19 113/70   Pulse Readings from Last 3 Encounters:  07/04/19 86  05/28/19 99  03/11/19 74    PROVIDERS: [name], MDis listed as PCP Fran Lowes, MD is nephrologist Rozann Lesches, MD is cardiologist   LABS: He is for ISTAT on arrival. As of 07/01/19, H/H 13.6/42.1, glucose 129.     IMAGES: 1V CXR 06/30/19: FINDINGS: The heart size and mediastinal contours are within normal limits. Aortic knob calcifications. Both lungs are clear. No acute osseous findings. Prior fixation seen in the right shoulder. IMPRESSION: No active disease.  Right femur xray 06/30/19: FINDINGS: There is nondisplaced mildly impacted fracture seen through the greater trochanter and intertrochanteric regions. No other fractures are seen. There is diffuse osteopenia. Dense vascular calcifications are noted. IMPRESSION: Nondisplaced greater trochanter and superior intertrochanteric fracture.   EKG: EKG 06/30/19: Sinus rhythm Right bundle branch block Probable inferior infarct, recent No significant change was found Confirmed by Ezequiel Essex [phone]) on 06/30/2019 6:11:19 AM   CV: ZioPatch 12/23/18-01/03/19: Study Highlights ZIO Patch monitor reviewed, 5 days 9 hours were able  to be analyzed after artifact removed. Predominant rhythm  is sinus with heart rate ranging from 60 bpm up to 116 bpm and average heart rate 85 bpm. There were occasional PACs representing 3.5% of total beats. There were also occasional PVCs representing 4.9% of total beats. Brief episodes of PSVT were noted lasting from 5-11 beats and nonsustained. There was a single episode of NSVT lasting 4 beats. No pauses were observed. - Reviewed by Dr. Domenic Polite, "Cardiac monitor showed occasional atrial and ventricular ectopy with brief episodes of PSVT and single episode of NSVT. Not entirely clear that this would explain his episodes of lightheadedness as there were no sustained arrhythmias or pauses..."    Echo 12/08/18: IMPRESSIONS 1. The left ventricle has normal systolic function, with an ejection fraction of 55-60%. The cavity size was normal. There is moderately increased left ventricular wall thickness. Left ventricular diastolic Doppler parameters are consistent with  impaired relaxation. Elevated mean left atrial pressure. 2. The right ventricle has normal systolic function. The cavity was normal. There is no increase in right ventricular wall thickness. 3. Left atrial size was severely dilated. 4. Mild thickening of the mitral valve leaflet. Mild calcification of the mitral valve leaflet. There is moderate mitral annular calcification present. No evidence of mitral valve stenosis. 5. The aortic valve has an indeterminate number of cusps. Moderate thickening of the aortic valve. Moderate calcification of the aortic valve. Aortic valve regurgitation is moderate by color flow Doppler. Moderate-severe stenosis of the aortic valve.  Moderate aortic annular calcification noted. 6. Moderate to severe aortic stenosis. The highest gradient is noted with the pedhoff probe. Mean gradient 38 mmHg, dimensionless inex 0.28, AVA VTI 0.84. 7. The aorta is normal in size and structure. 8. Pulmonary hypertension is indeterminate, inadequate TR jet. 9. The  interatrial septum was not well visualized. (Comparison echo 06/27/15 showed moderate AS.Valve area (VTI): 0.89 cm^2. Valve area (Vmax):1.02 cm^2. Valve area (Vmean): 0.91 cm^2. Follow-up scheduled 08/24/19.)    Cardiac cath 07/11/15 (done for abnormal 06/27/15 stress test, EF 36%, apical-inferior perfusion defect, inferior hypokinesis):  Prox LAD lesion, 45% stenosed.  There is moderate to severe left ventricular systolic dysfunction. -Nonischemic cardiomyopathy with global hypokinesis with more pronounced inferior hypocontractility and overall ejection fraction at 35%. -Mild coronary calcification with 40-50% eccentric proximal LAD stenosis just prior to a septal perforating artery; mild calcification of the proximal left circumflex coronary artery; and large dominant RCA without significant obstructive stenoses. -Probably at least mild aortic stenosis with LV to AO gradient ranging from 10 -18 mm Hg in this patient with reduced LV function; LVEDP 13 mm Hg.  RECOMMENDATION: Medical therapy for his cardiomyopathy and mild CAD. Consider a dobutamine stress echo if concern for low gradient AS with reduced LV function.   Past Medical History:  Diagnosis Date  . Anxiety   . Aortic stenosis   . Arthritis    left hand  . CVA (cerebral infarction)   . ESRD (end stage renal disease) on dialysis [facility])    M/W/F at [facility] in [place]  . Essential hypertension    resolved with HD  . Gangrene of right foot ([place])   . Gastric ulcer 2004  . GI bleed    gastric ulcer  . Heart murmur   . History of blood transfusion   . History of cardiomyopathy    LVEF normal as of February 2017  . History of gastric ulcer   . History of stroke    Left side weakness  .  Iron deficiency anemia   . Osteomyelitis ([place])   . Peripheral vascular disease ([place])   . Stroke [facility]) 2000   limp left    Past Surgical History:  Procedure Laterality Date  . ABDOMINAL AORTAGRAM N/A 01/24/2012   Procedure:  ABDOMINAL Maxcine Ham;  Surgeon: Elam Dutch, MD;  Location: [facility] CATH LAB;  Service: Cardiovascular;  Laterality: N/A;  . ABDOMINAL AORTOGRAM W/LOWER EXTREMITY N/A 04/15/2017   Procedure: ABDOMINAL AORTOGRAM W/LOWER EXTREMITY;  Surgeon: Angelia Mould, MD;  Location: [place] CV LAB;  Service: Cardiovascular;  Laterality: N/A;  . AMPUTATION Right 05/09/2017   Procedure: RIGHT TRANSMETATARSAL AMPUTATION;  Surgeon: [name], MD;  Location: [place];  Service: Orthopedics;  Laterality: Right;  . AMPUTATION Right 06/13/2018   Procedure: AMPUTATION RIGHT LONG FINGER;  Surgeon: Dayna Barker, MD;  Location: [place];  Service: Plastics;  Laterality: Right;  . ARTERIOVENOUS GRAFT PLACEMENT Right right arm  . AV FISTULA PLACEMENT Left 08/31/2015   Procedure: ARTERIOVENOUS (AV) FISTULA CREATION- LEFT RADIOCEPHALIC;  Surgeon: Mal Misty, MD;  Location: [place];  Service: Vascular;  Laterality: Left;  . AV FISTULA PLACEMENT Right 02/18/2017   Procedure: INSERTION OF ARTERIOVENOUS (AV) GORE-TEX GRAFT  RIGHT UPPER ARM;  Surgeon: Conrad [place], MD;  Location: [place];  Service: Vascular;  Laterality: Right;  . AV FISTULA PLACEMENT Left 12/01/2018   Procedure: INSERTION OF ARTERIOVENOUS (AV) GORE-TEX GRAFT ARM;  Surgeon: Angelia Mould, MD;  Location: [place];  Service: Vascular;  Laterality: Left;  . AV FISTULA PLACEMENT Left 02/09/2019   Procedure: INSERTION OF ARTERIOVENOUS (AV) GORE-TEX GRAFT LEFT THIGH;  Surgeon: Waynetta Sandy, MD;  Location: [place];  Service: Vascular;  Laterality: Left;  . BASCILIC VEIN TRANSPOSITION Left 12/19/2015   Procedure: FIRST STAGE BRACHIAL VEIN TRANSPOSITION;  Surgeon: Conrad [place], MD;  Location: [place];  Service: Vascular;  Laterality: Left;  . BASCILIC VEIN TRANSPOSITION Left 02/08/2016   Procedure: SECOND STAGE BRACHIAL VEIN TRANSPOSITION;  Surgeon: Conrad [place], MD;  Location: [place];  Service: Vascular;  Laterality: Left;  . CARDIAC CATHETERIZATION  N/A 07/11/2015   Procedure: Left Heart Cath and Coronary Angiography;  Surgeon: Troy Sine, MD;  Location: [place] CV LAB;  Service: Cardiovascular;  Laterality: N/A;  . Carpel Tunnel Left Dec. 22, 2016  . CHOLECYSTECTOMY    . COLONOSCOPY  2004   Dr. Irving Shows, left sided diverticula and cecal polyp, path unknown  . COLONOSCOPY  10/29/2011   Procedure: COLONOSCOPY;  Surgeon: Daneil Dolin, MD;  Location: [facility];  Service: Endoscopy;  Laterality: N/A;  10:15  . ESOPHAGOGASTRODUODENOSCOPY  11/2002   Dr. Gala Romney, erosive reflux esophagitis, multiple gastric ulcer and antral/bulbar erosions. Serologies positive for H.Pylori and was treated  . ESOPHAGOGASTRODUODENOSCOPY  11/20014   Dr. Gala Romney, small hh only, ulcers healed  . ESOPHAGOGASTRODUODENOSCOPY  09/21/2011   Dr Trevor Iha [facility], antral erosions, ?early GAVE  . FISTULOGRAM Left 12/10/2016   Procedure: THROMBECTOMY OF LEFT ARM ARTERIOVENOUS FISTULA;  Surgeon: Waynetta Sandy, MD;  Location: [place];  Service: Vascular;  Laterality: Left;  . INSERTION OF DIALYSIS CATHETER Left 12/10/2016   Procedure: INSERTION OF TUNNELED DIALYSIS CATHETER;  Surgeon: Waynetta Sandy, MD;  Location: [place];  Service: Vascular;  Laterality: Left;  . INSERTION OF DIALYSIS CATHETER Right 06/09/2018   Procedure: INSERTION OF DIALYSIS CATHETER Right subclavian;  Surgeon: Angelia Mould, MD;  Location: [place];  Service: Vascular;  Laterality: Right;  . IR  DIALY SHUNT INTRO NEEDLE/INTRACATH INITIAL W/IMG RIGHT Right 01/01/2018  . IR GENERIC HISTORICAL  07/16/2016   IR REMOVAL TUN CV CATH W/O FL 07/16/2016 Saverio Danker, PA-C [facility]-INTERV RAD  . IR PTA ADDL CENTRAL DIALYSIS SEG THRU DIALY CIRCUIT RIGHT Right 10/21/2017  . IR REMOVAL TUN CV CATH W/O FL  05/12/2017  . IR THROMBECTOMY AV FISTULA W/THROMBOLYSIS/PTA INC/SHUNT/IMG RIGHT Right 10/21/2017  . IR THROMBECTOMY AV FISTULA W/THROMBOLYSIS/PTA INC/SHUNT/IMG RIGHT Right 11/27/2017  . IR US GUIDE  VASC ACCESS RIGHT  10/21/2017  . IR US GUIDE VASC ACCESS RIGHT  11/27/2017  . IR US GUIDE VASC ACCESS RIGHT  01/01/2018  . LIGATION ARTERIOVENOUS GORTEX GRAFT Right 06/09/2018  . LIGATION ARTERIOVENOUS GORTEX GRAFT Right 06/09/2018   Procedure: LIGATION ARTERIOVENOUS GORTEX GRAFT RIGHT ARM;  Surgeon: Angelia Mould, MD;  Location: [place];  Service: Vascular;  Laterality: Right;  . LIGATION ARTERIOVENOUS GORTEX GRAFT Left 12/31/2018   Procedure: LIGATION OF LEFT UPPER ARM ARTERIVENOUS GORTEX GRAFT, LEFT BRACHIAL ARTERY ENDARTERECTOMY WITH BOVINE PATCH ANGIOPLASTY;  Surgeon: Waynetta Sandy, MD;  Location: [place];  Service: Vascular;  Laterality: Left;  . LIGATION OF ARTERIOVENOUS  FISTULA Left 12/19/2015   Procedure: LIGATION OF RADIOCEPHALIC ARTERIOVENOUS  FISTULA;  Surgeon: Conrad [place], MD;  Location: [place];  Service: Vascular;  Laterality: Left;  Marland Kitchen MASS EXCISION Right 02/18/2017   Procedure: EXCISION OF RIGHT AXILLARY EPIDERMAL INCLUSION CYST;  Surgeon: Conrad Abrams, MD;  Location: [place];  Service: Vascular;  Laterality: Right;  . PERIPHERAL VASCULAR CATHETERIZATION N/A 12/14/2015   Procedure: Fistulagram;  Surgeon: Conrad [place], MD;  Location: [place] CV LAB;  Service: Cardiovascular;  Laterality: N/A;  . SHOULDER SURGERY Right    fracture  . ULTRASOUND GUIDANCE FOR VASCULAR ACCESS  04/15/2017   Procedure: Ultrasound Guidance For Vascular Access;  Surgeon: Angelia Mould, MD;  Location: [place] CV LAB;  Service: Cardiovascular;;  . UPPER EXTREMITY VENOGRAPHY Bilateral 12/17/2016   Procedure: Bilateral Upper Extremity Venography;  Surgeon: Serafina Mitchell, MD;  Location: [place] CV LAB;  Service: Cardiovascular;  Laterality: Bilateral;  . UPPER EXTREMITY VENOGRAPHY Left 11/03/2018   Procedure: UPPER EXTREMITY VENOGRAPHY;  Surgeon: Serafina Mitchell, MD;  Location: [place] CV LAB;  Service: Cardiovascular;  Laterality: Left;    MEDICATIONS: No current  facility-administered medications for this encounter.   Marland Kitchen atorvastatin (LIPITOR) 40 MG tablet  . b complex-vitamin c-folic acid (NEPHRO-VITE) 0.8 MG TABS  . cholecalciferol (VITAMIN D3) 25 MCG (1000 UT) tablet  . cinacalcet (SENSIPAR) 60 MG tablet  . clopidogrel (PLAVIX) 75 MG tablet  . midodrine (PROAMATINE) 10 MG tablet  . sevelamer (RENVELA) 800 MG tablet  . vitamin C (ASCORBIC ACID) 500 MG tablet    Myra Gianotti, PA-C Surgical Short Stay/Anesthesiology [facility] Phone [phone] [facility] Phone [phone] 07/20/2019 3:23 PM

## 2019-07-21 ENCOUNTER — Encounter (HOSPITAL_COMMUNITY): Payer: Self-pay | Admitting: Orthopedic Surgery

## 2019-07-21 ENCOUNTER — Encounter (HOSPITAL_COMMUNITY)
Admission: RE | Disposition: A | Discharge status: Rehabilitation Facility | Payer: Self-pay | Source: Home / Self Care | Attending: Orthopedic Surgery

## 2019-07-21 ENCOUNTER — Inpatient Hospital Stay (HOSPITAL_COMMUNITY): Payer: Medicare Other | Admitting: Vascular Surgery

## 2019-07-21 ENCOUNTER — Observation Stay (HOSPITAL_COMMUNITY)
Admission: RE | Admit: 2019-07-21 | Discharge: 2019-07-22 | Disposition: A | Discharge status: Rehabilitation Facility | Payer: Medicare Other | Attending: Orthopedic Surgery | Admitting: Orthopedic Surgery

## 2019-07-21 ENCOUNTER — Other Ambulatory Visit: Payer: Self-pay

## 2019-07-21 DIAGNOSIS — Z87891 Personal history of nicotine dependence: Secondary | ICD-10-CM | POA: Diagnosis not present

## 2019-07-21 DIAGNOSIS — R2681 Unsteadiness on feet: Secondary | ICD-10-CM | POA: Diagnosis not present

## 2019-07-21 DIAGNOSIS — M86172 Other acute osteomyelitis, left ankle and foot: Principal | ICD-10-CM | POA: Diagnosis present

## 2019-07-21 DIAGNOSIS — N2581 Secondary hyperparathyroidism of renal origin: Secondary | ICD-10-CM | POA: Insufficient documentation

## 2019-07-21 DIAGNOSIS — I70261 Atherosclerosis of native arteries of extremities with gangrene, right leg: Secondary | ICD-10-CM | POA: Insufficient documentation

## 2019-07-21 DIAGNOSIS — E8889 Other specified metabolic disorders: Secondary | ICD-10-CM | POA: Diagnosis not present

## 2019-07-21 DIAGNOSIS — I120 Hypertensive chronic kidney disease with stage 5 chronic kidney disease or end stage renal disease: Secondary | ICD-10-CM | POA: Insufficient documentation

## 2019-07-21 DIAGNOSIS — I96 Gangrene, not elsewhere classified: Secondary | ICD-10-CM | POA: Diagnosis not present

## 2019-07-21 DIAGNOSIS — Z7902 Long term (current) use of antithrombotics/antiplatelets: Secondary | ICD-10-CM | POA: Insufficient documentation

## 2019-07-21 DIAGNOSIS — Z89421 Acquired absence of other right toe(s): Secondary | ICD-10-CM | POA: Insufficient documentation

## 2019-07-21 DIAGNOSIS — Z20822 Contact with and (suspected) exposure to covid-19: Secondary | ICD-10-CM | POA: Diagnosis not present

## 2019-07-21 DIAGNOSIS — I350 Nonrheumatic aortic (valve) stenosis: Secondary | ICD-10-CM | POA: Diagnosis not present

## 2019-07-21 DIAGNOSIS — M6281 Muscle weakness (generalized): Secondary | ICD-10-CM | POA: Insufficient documentation

## 2019-07-21 DIAGNOSIS — I428 Other cardiomyopathies: Secondary | ICD-10-CM | POA: Insufficient documentation

## 2019-07-21 DIAGNOSIS — I69354 Hemiplegia and hemiparesis following cerebral infarction affecting left non-dominant side: Secondary | ICD-10-CM | POA: Insufficient documentation

## 2019-07-21 DIAGNOSIS — Z79899 Other long term (current) drug therapy: Secondary | ICD-10-CM | POA: Insufficient documentation

## 2019-07-21 DIAGNOSIS — Z992 Dependence on renal dialysis: Secondary | ICD-10-CM | POA: Diagnosis not present

## 2019-07-21 DIAGNOSIS — D649 Anemia, unspecified: Secondary | ICD-10-CM | POA: Insufficient documentation

## 2019-07-21 DIAGNOSIS — N186 End stage renal disease: Secondary | ICD-10-CM | POA: Diagnosis not present

## 2019-07-21 DIAGNOSIS — Z8249 Family history of ischemic heart disease and other diseases of the circulatory system: Secondary | ICD-10-CM | POA: Insufficient documentation

## 2019-07-21 DIAGNOSIS — M19042 Primary osteoarthritis, left hand: Secondary | ICD-10-CM | POA: Insufficient documentation

## 2019-07-21 DIAGNOSIS — Z95828 Presence of other vascular implants and grafts: Secondary | ICD-10-CM | POA: Insufficient documentation

## 2019-07-21 DIAGNOSIS — Z886 Allergy status to analgesic agent status: Secondary | ICD-10-CM | POA: Insufficient documentation

## 2019-07-21 HISTORY — PX: AMPUTATION: SHX166

## 2019-07-21 HISTORY — DX: Other acute osteomyelitis, left ankle and foot: M86.172

## 2019-07-21 HISTORY — DX: Osteomyelitis, unspecified: M86.9

## 2019-07-21 LAB — RENAL FUNCTION PANEL
Albumin: 2.3 g/dL — ABNORMAL LOW (ref 3.5–5.0)
Anion gap: 12 (ref 5–15)
BUN: 28 mg/dL — ABNORMAL HIGH (ref 8–23)
CO2: 29 mmol/L (ref 22–32)
Calcium: 9.7 mg/dL (ref 8.9–10.3)
Chloride: 96 mmol/L — ABNORMAL LOW (ref 98–111)
Creatinine, Ser: 8.85 mg/dL — ABNORMAL HIGH (ref 0.61–1.24)
GFR calc Af Amer: 6 mL/min — ABNORMAL LOW (ref >60)
GFR calc non Af Amer: 5 mL/min — ABNORMAL LOW (ref >60)
Glucose, Bld: 109 mg/dL — ABNORMAL HIGH (ref 70–99)
Phosphorus: 3.1 mg/dL (ref 2.5–4.6)
Potassium: 4.4 mmol/L (ref 3.5–5.1)
Sodium: 137 mmol/L (ref 135–145)

## 2019-07-21 LAB — POCT I-STAT, CHEM 8
BUN: 30 mg/dL — ABNORMAL HIGH (ref 8–23)
Calcium, Ion: 1.35 mmol/L (ref 1.15–1.40)
Chloride: 96 mmol/L — ABNORMAL LOW (ref 98–111)
Creatinine, Ser: 9.5 mg/dL — ABNORMAL HIGH (ref 0.61–1.24)
Glucose, Bld: 90 mg/dL (ref 70–99)
HCT: 45 % (ref 39.0–52.0)
Hemoglobin: 15.3 g/dL (ref 13.0–17.0)
Potassium: 4.4 mmol/L (ref 3.5–5.1)
Sodium: 137 mmol/L (ref 135–145)
TCO2: 34 mmol/L — ABNORMAL HIGH (ref 22–32)

## 2019-07-21 LAB — CBC
HCT: 38.7 % — ABNORMAL LOW (ref 39.0–52.0)
Hemoglobin: 12.3 g/dL — ABNORMAL LOW (ref 13.0–17.0)
MCH: 21.2 pg — ABNORMAL LOW (ref 26.0–34.0)
MCHC: 31.8 g/dL (ref 30.0–36.0)
MCV: 66.8 fL — ABNORMAL LOW (ref 80.0–100.0)
Platelets: 382 10*3/uL (ref 150–400)
RBC: 5.79 MIL/uL (ref 4.22–5.81)
RDW: 18.3 % — ABNORMAL HIGH (ref 11.5–15.5)
WBC: 14.2 10*3/uL — ABNORMAL HIGH (ref 4.0–10.5)
nRBC: 0 % (ref 0.0–0.2)

## 2019-07-21 LAB — SURGICAL PCR SCREEN
MRSA, PCR: POSITIVE — AB
Staphylococcus aureus: POSITIVE — AB

## 2019-07-21 SURGERY — AMPUTATION, FOOT, RAY
Anesthesia: Regional | Laterality: Left

## 2019-07-21 MED ORDER — ALTEPLASE 2 MG IJ SOLR: 2.0000 mg | Freq: Once | INTRAMUSCULAR | Status: DC | PRN

## 2019-07-21 MED ORDER — HEPARIN SODIUM (PORCINE) 1000 UNIT/ML DIALYSIS: 1000.0000 [IU] | INTRAMUSCULAR | Status: DC | PRN

## 2019-07-21 MED ORDER — HYDROMORPHONE HCL 1 MG/ML IJ SOLN: 0.5000 mg | INTRAMUSCULAR | Status: DC | PRN

## 2019-07-21 MED ORDER — FENTANYL CITRATE (PF) 250 MCG/5ML IJ SOLN
INTRAMUSCULAR | Status: AC
  Filled 2019-07-21: qty 5

## 2019-07-21 MED ORDER — ATORVASTATIN CALCIUM 40 MG PO TABS
40.0000 mg | ORAL_TABLET | Freq: Every day | ORAL | Status: DC
  Administered 2019-07-21 – 2019-07-22 (×2): 40 mg via ORAL
  Filled 2019-07-21 (×2): qty 1

## 2019-07-21 MED ORDER — CEFAZOLIN SODIUM-DEXTROSE 1-4 GM/50ML-% IV SOLN
1.0000 g | Freq: Three times a day (TID) | INTRAVENOUS | Status: AC
  Administered 2019-07-21: 1 g via INTRAVENOUS
  Filled 2019-07-21: qty 50

## 2019-07-21 MED ORDER — ACETAMINOPHEN 325 MG PO TABS: 325.0000 mg | ORAL_TABLET | Freq: Four times a day (QID) | ORAL | Status: DC | PRN

## 2019-07-21 MED ORDER — OXYCODONE HCL 5 MG PO TABS
5.0000 mg | ORAL_TABLET | ORAL | Status: DC | PRN
  Administered 2019-07-21 – 2019-07-22 (×2): 10 mg via ORAL
  Filled 2019-07-21 (×2): qty 2

## 2019-07-21 MED ORDER — MIDAZOLAM HCL 2 MG/2ML IJ SOLN
INTRAMUSCULAR | Status: AC
  Filled 2019-07-21: qty 2

## 2019-07-21 MED ORDER — ONDANSETRON HCL 4 MG/2ML IJ SOLN
INTRAMUSCULAR | Status: DC | PRN
  Administered 2019-07-21: 4 mg via INTRAVENOUS

## 2019-07-21 MED ORDER — SODIUM CHLORIDE 0.9 % IV SOLN: 100.0000 mL | INTRAVENOUS | Status: DC | PRN

## 2019-07-21 MED ORDER — DOXERCALCIFEROL 4 MCG/2ML IV SOLN
2.0000 ug | INTRAVENOUS | Status: DC
  Administered 2019-07-21: 2 ug via INTRAVENOUS
  Filled 2019-07-21: qty 2

## 2019-07-21 MED ORDER — DOCUSATE SODIUM 100 MG PO CAPS
100.0000 mg | ORAL_CAPSULE | Freq: Two times a day (BID) | ORAL | Status: DC
  Administered 2019-07-22: 100 mg via ORAL
  Filled 2019-07-21 (×2): qty 1

## 2019-07-21 MED ORDER — CEFAZOLIN SODIUM-DEXTROSE 2-3 GM-%(50ML) IV SOLR
INTRAVENOUS | Status: DC | PRN
  Administered 2019-07-21: 2 g via INTRAVENOUS

## 2019-07-21 MED ORDER — PHENYLEPHRINE HCL-NACL 10-0.9 MG/250ML-% IV SOLN
INTRAVENOUS | Status: DC | PRN
  Administered 2019-07-21: 25 ug/min via INTRAVENOUS

## 2019-07-21 MED ORDER — ONDANSETRON HCL 4 MG PO TABS: 4.0000 mg | ORAL_TABLET | Freq: Four times a day (QID) | ORAL | Status: DC | PRN

## 2019-07-21 MED ORDER — PENTAFLUOROPROP-TETRAFLUOROETH EX AERO: 1.0000 "application " | INHALATION_SPRAY | CUTANEOUS | Status: DC | PRN

## 2019-07-21 MED ORDER — FENTANYL CITRATE (PF) 100 MCG/2ML IJ SOLN
INTRAMUSCULAR | Status: DC | PRN
  Administered 2019-07-21: 50 ug via INTRAVENOUS

## 2019-07-21 MED ORDER — SEVELAMER CARBONATE 800 MG PO TABS
3200.0000 mg | ORAL_TABLET | Freq: Three times a day (TID) | ORAL | Status: DC
  Administered 2019-07-21 – 2019-07-22 (×3): 3200 mg via ORAL
  Filled 2019-07-21 (×3): qty 4

## 2019-07-21 MED ORDER — ONDANSETRON HCL 4 MG/2ML IJ SOLN: 4.0000 mg | Freq: Four times a day (QID) | INTRAMUSCULAR | Status: DC | PRN

## 2019-07-21 MED ORDER — CHLORHEXIDINE GLUCONATE CLOTH 2 % EX PADS
6.0000 | MEDICATED_PAD | Freq: Every day | CUTANEOUS | Status: DC
  Administered 2019-07-21 – 2019-07-22 (×2): 6 via TOPICAL

## 2019-07-21 MED ORDER — FENTANYL CITRATE (PF) 100 MCG/2ML IJ SOLN
INTRAMUSCULAR | Status: AC
  Filled 2019-07-21: qty 2

## 2019-07-21 MED ORDER — PROPOFOL 500 MG/50ML IV EMUL
INTRAVENOUS | Status: DC | PRN
  Administered 2019-07-21: 85 ug/kg/min via INTRAVENOUS

## 2019-07-21 MED ORDER — PROPOFOL 500 MG/50ML IV EMUL: INTRAVENOUS | Status: DC | PRN

## 2019-07-21 MED ORDER — LIDOCAINE HCL (PF) 1 % IJ SOLN: 5.0000 mL | INTRAMUSCULAR | Status: DC | PRN

## 2019-07-21 MED ORDER — ASCORBIC ACID 500 MG PO TABS
500.0000 mg | ORAL_TABLET | Freq: Every day | ORAL | Status: DC
  Administered 2019-07-21 – 2019-07-22 (×2): 500 mg via ORAL
  Filled 2019-07-21 (×2): qty 1

## 2019-07-21 MED ORDER — PROPOFOL 10 MG/ML IV BOLUS
INTRAVENOUS | Status: AC
  Filled 2019-07-21: qty 60

## 2019-07-21 MED ORDER — SEVELAMER CARBONATE 800 MG PO TABS: 1600.0000 mg | ORAL_TABLET | ORAL | Status: DC | PRN

## 2019-07-21 MED ORDER — NEPRO/CARBSTEADY PO LIQD
237.0000 mL | Freq: Two times a day (BID) | ORAL | Status: DC
  Administered 2019-07-21: 237 mL via ORAL

## 2019-07-21 MED ORDER — CINACALCET HCL 30 MG PO TABS
60.0000 mg | ORAL_TABLET | Freq: Every evening | ORAL | Status: DC
  Administered 2019-07-21: 60 mg via ORAL
  Filled 2019-07-21: qty 2

## 2019-07-21 MED ORDER — MIDAZOLAM HCL 5 MG/5ML IJ SOLN
INTRAMUSCULAR | Status: DC | PRN
  Administered 2019-07-21: 1 mg via INTRAVENOUS

## 2019-07-21 MED ORDER — VITAMIN D 25 MCG (1000 UNIT) PO TABS
1000.0000 [IU] | ORAL_TABLET | Freq: Every day | ORAL | Status: DC
  Administered 2019-07-21 – 2019-07-22 (×2): 1000 [IU] via ORAL
  Filled 2019-07-21 (×2): qty 1

## 2019-07-21 MED ORDER — DOXERCALCIFEROL 4 MCG/2ML IV SOLN
INTRAVENOUS | Status: AC
  Filled 2019-07-21: qty 2

## 2019-07-21 MED ORDER — ROPIVACAINE HCL 5 MG/ML IJ SOLN
INTRAMUSCULAR | Status: DC | PRN
  Administered 2019-07-21: 30 mL via PERINEURAL

## 2019-07-21 MED ORDER — MUPIROCIN 2 % EX OINT
TOPICAL_OINTMENT | CUTANEOUS | Status: AC
  Filled 2019-07-21: qty 22

## 2019-07-21 MED ORDER — 0.9 % SODIUM CHLORIDE (POUR BTL) OPTIME
TOPICAL | Status: DC | PRN
  Administered 2019-07-21: 1000 mL

## 2019-07-21 MED ORDER — LIDOCAINE-PRILOCAINE 2.5-2.5 % EX CREA
1.0000 "application " | TOPICAL_CREAM | CUTANEOUS | Status: DC | PRN
  Filled 2019-07-21: qty 5

## 2019-07-21 MED ORDER — SODIUM CHLORIDE 0.9 % IV SOLN: INTRAVENOUS | Status: DC

## 2019-07-21 MED ORDER — MIDODRINE HCL 5 MG PO TABS
10.0000 mg | ORAL_TABLET | ORAL | Status: DC
  Administered 2019-07-21: 10 mg via ORAL
  Filled 2019-07-21: qty 2

## 2019-07-21 MED ORDER — CLOPIDOGREL BISULFATE 75 MG PO TABS
75.0000 mg | ORAL_TABLET | Freq: Every day | ORAL | Status: DC
  Administered 2019-07-22: 75 mg via ORAL
  Filled 2019-07-21: qty 1

## 2019-07-21 MED ORDER — SODIUM CHLORIDE 0.9 % IV SOLN: INTRAVENOUS | Status: DC | PRN

## 2019-07-21 MED ORDER — RENA-VITE PO TABS
1.0000 | ORAL_TABLET | Freq: Every day | ORAL | Status: DC
  Administered 2019-07-21: 1 via ORAL
  Filled 2019-07-21: qty 1

## 2019-07-21 SURGICAL SUPPLY — 31 items
BLADE SAW SGTL MED 73X18.5 STR (BLADE) IMPLANT
BLADE SURG 21 STRL SS (BLADE) ×3 IMPLANT
BNDG COHESIVE 4X5 TAN STRL (GAUZE/BANDAGES/DRESSINGS) ×3 IMPLANT
BNDG GAUZE ELAST 4 BULKY (GAUZE/BANDAGES/DRESSINGS) ×3 IMPLANT
COVER SURGICAL LIGHT HANDLE (MISCELLANEOUS) ×6 IMPLANT
COVER WAND RF STERILE (DRAPES) ×3 IMPLANT
DRAPE U-SHAPE 47X51 STRL (DRAPES) ×6 IMPLANT
DRSG ADAPTIC 3X8 NADH LF (GAUZE/BANDAGES/DRESSINGS) ×3 IMPLANT
DRSG PAD ABDOMINAL 8X10 ST (GAUZE/BANDAGES/DRESSINGS) ×6 IMPLANT
DURAPREP 26ML APPLICATOR (WOUND CARE) ×3 IMPLANT
ELECT REM PT RETURN 9FT ADLT (ELECTROSURGICAL) ×3
ELECTRODE REM PT RTRN 9FT ADLT (ELECTROSURGICAL) ×1 IMPLANT
GAUZE SPONGE 4X4 12PLY STRL (GAUZE/BANDAGES/DRESSINGS) ×3 IMPLANT
GLOVE BIOGEL PI IND STRL 9 (GLOVE) ×1 IMPLANT
GLOVE BIOGEL PI INDICATOR 9 (GLOVE) ×2
GLOVE SURG ORTHO 9.0 STRL STRW (GLOVE) ×3 IMPLANT
GOWN STRL REUS W/ TWL XL LVL3 (GOWN DISPOSABLE) ×2 IMPLANT
GOWN STRL REUS W/TWL XL LVL3 (GOWN DISPOSABLE) ×6
KIT BASIN OR (CUSTOM PROCEDURE TRAY) ×3 IMPLANT
KIT DRSG PREVENA PLUS 7DAY 125 (MISCELLANEOUS) ×2 IMPLANT
KIT PREVENA INCISION MGT 13 (CANNISTER) ×2 IMPLANT
KIT TURNOVER KIT B (KITS) ×3 IMPLANT
NS IRRIG 1000ML POUR BTL (IV SOLUTION) ×3 IMPLANT
PACK ORTHO EXTREMITY (CUSTOM PROCEDURE TRAY) ×3 IMPLANT
PAD ARMBOARD 7.5X6 YLW CONV (MISCELLANEOUS) ×6 IMPLANT
STOCKINETTE IMPERVIOUS LG (DRAPES) IMPLANT
SUT ETHILON 2 0 PSLX (SUTURE) ×3 IMPLANT
TOWEL GREEN STERILE (TOWEL DISPOSABLE) ×3 IMPLANT
TUBE CONNECTING 12'X1/4 (SUCTIONS) ×1
TUBE CONNECTING 12X1/4 (SUCTIONS) ×2 IMPLANT
YANKAUER SUCT BULB TIP NO VENT (SUCTIONS) ×3 IMPLANT

## 2019-07-21 NOTE — Progress Notes (Signed)
Orthopedic Tech Progress Note Patient Details:  MATILDE MARKIE Sr. [birth year] [id number]  Ortho Devices Type of Ortho Device: Postop shoe/boot Ortho Device/Splint Location: LLE Ortho Device/Splint Interventions: Ordered, Application   Post Interventions Patient Tolerated: Well Instructions Provided: Care of device, Adjustment of device   [name] 07/21/2019, 11:45 AM

## 2019-07-21 NOTE — Evaluation (Signed)
Occupational Therapy Evaluation Patient Details Name: Joseph BUSCH Sr. MRN: [id number] DOB: April 07, 1941 Today's Date: 07/21/2019    History of Present Illness Pt is a 79 y/o male with PMH of R LE transmetatarsal amp, HTN, arthritis, CVA with residual L sided weakness, arthritis, anxiety, ESRD M/W/F, recent fall with R hip avulasion fx WBAT with no medical mgmt.  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    Clinical Impression   Patient admitted for above and limited by problem list below, including generalized weakness, decreased activity tolerance, TWB L LE, and impaired balance.  Patient with recent hip fx and has been residing at [facility], reports requiring some assist for ADLs and using RW with assist.  Currently requires min guard for bed mobility, min assist for lateral scooting towards HOB (but unable to stand maintaining TWB LLE from elevated bed), min assist for UB ADLs and max assist for LB ADLs.  He will benefit from continued OT services while admitted and after dc return to SNF to optimize independence and safety with ADLs and mobility.     Follow Up Recommendations  SNF;Supervision/Assistance - 24 hour    Equipment Recommendations  Other (comment)(TBD at next venue of care )    Recommendations for Other Services       Precautions / Restrictions Precautions Precautions: Fall Required Braces or Orthoses: Other Brace Other Brace: post op shoe L  Restrictions Weight Bearing Restrictions: Yes RLE Weight Bearing: Weight bearing as tolerated LLE Weight Bearing: Touchdown weight bearing Other Position/Activity Restrictions: Can be TWB      Mobility Bed Mobility Overal bed mobility: Needs Assistance Bed Mobility: Supine to Sit;Sit to Supine     Supine to sit: Min guard Sit to supine: Min guard   General bed mobility comments: min guard with HOB elevated   Transfers Overall transfer level: Needs assistance Equipment  used: Rolling walker (2 wheeled) Transfers: Sit to/from Stand;Lateral/Scoot Transfers Sit to Stand: (attempted with HOB elevated, unsuccessful )        Lateral/Scoot Transfers: Min assist General transfer comment: multiple attempts from sit to stand from elevated EOB maintaining TWB to L LE but minimal hip clearance from EOB, demonstrates ability to lateral scoot maintaining precautions to [facility] with min assist     Balance Overall balance assessment: Needs assistance Sitting-balance support: No upper extremity supported;Feet supported Sitting balance-Leahy Scale: Fair Sitting balance - Comments: EOB min guard to supervision                                    ADL either performed or assessed with clinical judgement   ADL Overall ADL's : Needs assistance/impaired     Grooming: Minimal assistance;Sitting   Upper Body Bathing: Sitting;Min guard   Lower Body Bathing: Maximal assistance;Sitting/lateral leans   Upper Body Dressing : Sitting;Min guard   Lower Body Dressing: Maximal assistance;Sitting/lateral leans     Toilet Transfer Details (indicate cue type and reason): simulated lateral scooting towards HOB with min assist          Functional mobility during ADLs: Minimal assistance;Cueing for safety General ADL Comments: patient limited by weakness, TWB L LE, and decreased activity tolerance      Vision   Vision Assessment?: No apparent visual deficits     Perception     Praxis      Pertinent Vitals/Pain Pain Assessment: No/denies pain  Hand Dominance Right   Extremity/Trunk Assessment Upper Extremity Assessment Upper Extremity Assessment: Generalized weakness(noted arthritic changes with limited BUE shoulder flexion )   Lower Extremity Assessment Lower Extremity Assessment: Defer to PT evaluation(s/p L transmetatarsal amputation )   Cervical / Trunk Assessment Cervical / Trunk Assessment: Normal   Communication  Communication Communication: No difficulties   Cognition Arousal/Alertness: Awake/alert Behavior During Therapy: WFL for tasks assessed/performed Overall Cognitive Status: Within Functional Limits for tasks assessed                                     General Comments       Exercises     Shoulder Instructions      Home Living Family/patient expects to be discharged to:: Skilled nursing facility Living Arrangements: Other (Comment)(Prior to SNF d/c living conditions below)   Type of Home: Apartment Home Access: Level entry     Home Layout: One level     Bathroom Shower/Tub: Occupational psychologist: Handicapped height     Home Equipment: Environmental consultant - 2 wheels;Cane - single point          Prior Functioning/Environment Level of Independence: Needs assistance  Gait / Transfers Assistance Needed: using RW with assist for mobility, wc for longer distances   ADL's / Homemaking Assistance Needed: some assist with ADLs (LB) from staff at sink             OT Problem List: Decreased strength;Impaired balance (sitting and/or standing);Decreased knowledge of use of DME or AE;Decreased activity tolerance;Decreased knowledge of precautions      OT Treatment/Interventions: Self-care/ADL training;DME and/or AE instruction;Therapeutic activities;Balance training;Therapeutic exercise;Patient/family education    OT Goals(Current goals can be found in the care plan section) Acute Rehab OT Goals Patient Stated Goal: get back to rehab and get stronger OT Goal Formulation: With patient Time For Goal Achievement: 08/04/19 Potential to Achieve Goals: Good  OT Frequency: Min 2X/week   Barriers to D/C:            Co-evaluation              AM-PAC OT "6 Clicks" Daily Activity     Outcome Measure Help from another person eating meals?: None Help from another person taking care of personal grooming?: A Little Help from another person toileting, which  includes using toliet, bedpan, or urinal?: A Lot Help from another person bathing (including washing, rinsing, drying)?: A Lot Help from another person to put on and taking off regular upper body clothing?: A Little Help from another person to put on and taking off regular lower body clothing?: A Lot 6 Click Score: 16   End of Session Equipment Utilized During Treatment: Gait belt;Rolling walker;Other (comment)(post op shoe ) Nurse Communication: Mobility status  Activity Tolerance: Patient tolerated treatment well Patient left: in bed;with call bell/phone within reach;with bed alarm set  OT Visit Diagnosis: Other abnormalities of gait and mobility (R26.89);Muscle weakness (generalized) (M62.81)                Time: 2633-3545 OT Time Calculation (min): 14 min Charges:  OT General Charges $OT Visit: 1 Visit OT Evaluation $OT Eval Moderate Complexity: 1 Mod  Jolaine Artist, OT Acute Rehabilitation Services Pager [phone] Office [phone]    Delight Stare 07/21/2019, 12:55 PM

## 2019-07-21 NOTE — Progress Notes (Signed)
PT/OT/SLP Cancellation Note  Treatment cancelled today due to patient receiving procedure or test  PT will continue checking on patient as time allows.  Ann Held PT, DPT Acute Rehab [facility] P: [phone]

## 2019-07-21 NOTE — Op Note (Signed)
07/21/2019  8:10 AM  PATIENT:  Joseph [place] Sr.    PRE-OPERATIVE DIAGNOSIS:  Gangrene Left Foot  POST-OPERATIVE DIAGNOSIS:  Same  PROCEDURE:  LEFT TRANSMETATARSAL AMPUTATION Application Prevena wound VAC 13 cm  SURGEON:  [name], MD  PHYSICIAN ASSISTANT:None ANESTHESIA:   General  PREOPERATIVE INDICATIONS:  Joseph CATLIN Sr. is a  79 y.o. male with a diagnosis of Gangrene Left Foot who failed conservative measures and elected for surgical management.    The risks benefits and alternatives were discussed with the patient preoperatively including but not limited to the risks of infection, bleeding, nerve injury, cardiopulmonary complications, the need for revision surgery, among others, and the patient was willing to proceed.  OPERATIVE IMPLANTS: Praveena wound VAC 13 cm  @ENCIMAGES @  OPERATIVE FINDINGS: Calcified vessels with minimal petechial bleeding no abscess.  OPERATIVE PROCEDURE: Patient was brought the operating room after undergoing a regional anesthetic the left lower extremity was prepped using DuraPrep draped into a sterile field a timeout was called.  A fishmouth incision was made proximal to the gangrenous tissue this was carried down to bone and an oscillating saw was used to perform a transmetatarsal amputation.  There was calcification of the vessels electrocautery was used for hemostasis there was minimal petechial bleeding but no abscess.  Wound was irrigated normal saline and incision was closed using 2-0 nylon.  A Prevena wound VAC was applied this had a good suction fit patient was taken the PACU in stable condition.   DISCHARGE PLANNING:  Antibiotic duration: 24-hour antibiotics  Weightbearing: Ideally nonweightbearing on the left foot may be touchdown weightbearing  Pain medication: Opioid pathway  Dressing care/ Wound VAC: Continue wound VAC for 1 week  Ambulatory devices: Walker  Discharge to: Plan for observation dialysis and discharge back to  skilled nursing  Follow-up: In the office 1 week post operative.

## 2019-07-21 NOTE — Transfer of Care (Signed)
Immediate Anesthesia Transfer of Care Note  Patient: Joseph OTTERNESS Sr.  Procedure(s) Performed: LEFT TRANSMETATARSAL AMPUTATION (Left )  Patient Location: PACU  Anesthesia Type:MAC combined with regional for post-op pain  Level of Consciousness: awake, alert , oriented and patient cooperative  Airway & Oxygen Therapy: Patient Spontanous Breathing and Patient connected to face mask oxygen  Post-op Assessment: Report given to RN and Post -op Vital signs reviewed and stable  Post vital signs: Reviewed and stable  Last Vitals:  Vitals Value Taken Time  BP    Temp    Pulse 43 07/21/19 0806  Resp 36 07/21/19 0806  SpO2 100 % 07/21/19 0806  Vitals shown include unvalidated device data.  Last Pain:  Vitals:   07/21/19 0604  TempSrc:   PainSc: 0-No pain         Complications: No apparent anesthesia complications

## 2019-07-21 NOTE — Anesthesia Postprocedure Evaluation (Signed)
Anesthesia Post Note  Patient: MAXEMILIANO RIEL Sr.  Procedure(s) Performed: LEFT TRANSMETATARSAL AMPUTATION (Left )     Patient location during evaluation: PACU Anesthesia Type: Regional Level of consciousness: awake and alert Pain management: pain level controlled Vital Signs Assessment: post-procedure vital signs reviewed and stable Respiratory status: spontaneous breathing, nonlabored ventilation, respiratory function stable and patient connected to nasal cannula oxygen Cardiovascular status: stable and blood pressure returned to baseline Postop Assessment: no apparent nausea or vomiting Anesthetic complications: no    Last Vitals:  Vitals:   07/21/19 1245 07/21/19 1300  BP: 108/69 110/71  Pulse: 88 93  Resp: (!) 21 19  Temp: 36.9 C   SpO2:      Last Pain:  Vitals:   07/21/19 1300  TempSrc:   PainSc: [place] A Antwain Caliendo

## 2019-07-21 NOTE — Progress Notes (Signed)
Orthopedic Tech Progress Note Patient Details:  Joseph DALBY Sr. 03/18/1941 [id number] Called in order to [facility] for a SHRINKER FOR THE LEFT TRANSMETATARSAL AMPUTATION  Patient ID: Joseph Kirksey., male   DOB: 14-Feb-1941, 79 y.o.   MRN: [id number]   [name] 07/21/2019, 10:21 AM

## 2019-07-21 NOTE — Consult Note (Addendum)
[facility] Renal Consultation Note    Indication for Consultation:  Management of ESRD/hemodialysis; anemia, hypertension/volume and secondary hyperparathyroidism   HPI: Joseph ROBEY Sr. is a 79 y.o. male with ESRD on MWF [facility] over 20 years with extensive PMHx listed below admitted by Dr. Sharol Given following left transmet amputation/VAC due to gangrene who failed conservative measures.  He is seen in his room following the procedure, doing well and ate most of breakfast.  He has no SOB, CP, swelling, no fever or chills, N or V.  He says his dialysis treatments are uneventful and thigh graft has been working well.  He is due for dialysis today. He does not know the name of his binders but takes 4 big white pills that you swallow. Also reports he has a cracked right hip (06/30/19 admission) and walking with a walker  Past Medical History:  Diagnosis Date  . Anxiety   . Aortic stenosis   . Arthritis    left hand  . CVA (cerebral infarction)   . ESRD (end stage renal disease) on dialysis [facility])    M/W/F at [facility] in [place]  . Essential hypertension    resolved with HD  . Gangrene of right foot ([place])   . Gastric ulcer 2004  . GI bleed    gastric ulcer  . Heart murmur   . History of blood transfusion   . History of cardiomyopathy    LVEF normal as of February 2017  . History of gastric ulcer   . History of stroke    Left side weakness  . Iron deficiency anemia   . Osteomyelitis ([place])   . Peripheral vascular disease ([place])   . Stroke [facility]) 2000   limp left   Past Surgical History:  Procedure Laterality Date  . ABDOMINAL AORTAGRAM N/A 01/24/2012   Procedure: ABDOMINAL Maxcine Ham;  Surgeon: Elam Dutch, MD;  Location: [facility] CATH LAB;  Service: Cardiovascular;  Laterality: N/A;  . ABDOMINAL AORTOGRAM W/LOWER EXTREMITY N/A 04/15/2017   Procedure: ABDOMINAL AORTOGRAM W/LOWER EXTREMITY;  Surgeon: Angelia Mould, MD;  Location: [place] CV LAB;  Service:  Cardiovascular;  Laterality: N/A;  . AMPUTATION Right 05/09/2017   Procedure: RIGHT TRANSMETATARSAL AMPUTATION;  Surgeon: [name], MD;  Location: [place];  Service: Orthopedics;  Laterality: Right;  . AMPUTATION Right 06/13/2018   Procedure: AMPUTATION RIGHT LONG FINGER;  Surgeon: Dayna Barker, MD;  Location: [place];  Service: Plastics;  Laterality: Right;  . ARTERIOVENOUS GRAFT PLACEMENT Right right arm  . AV FISTULA PLACEMENT Left 08/31/2015   Procedure: ARTERIOVENOUS (AV) FISTULA CREATION- LEFT RADIOCEPHALIC;  Surgeon: Mal Misty, MD;  Location: [place];  Service: Vascular;  Laterality: Left;  . AV FISTULA PLACEMENT Right 02/18/2017   Procedure: INSERTION OF ARTERIOVENOUS (AV) GORE-TEX GRAFT  RIGHT UPPER ARM;  Surgeon: Conrad Odell, MD;  Location: [place];  Service: Vascular;  Laterality: Right;  . AV FISTULA PLACEMENT Left 12/01/2018   Procedure: INSERTION OF ARTERIOVENOUS (AV) GORE-TEX GRAFT ARM;  Surgeon: Angelia Mould, MD;  Location: [place];  Service: Vascular;  Laterality: Left;  . AV FISTULA PLACEMENT Left 02/09/2019   Procedure: INSERTION OF ARTERIOVENOUS (AV) GORE-TEX GRAFT LEFT THIGH;  Surgeon: Waynetta Sandy, MD;  Location: [place];  Service: Vascular;  Laterality: Left;  . BASCILIC VEIN TRANSPOSITION Left 12/19/2015   Procedure: FIRST STAGE BRACHIAL VEIN TRANSPOSITION;  Surgeon: Conrad Orient, MD;  Location: [place];  Service: Vascular;  Laterality: Left;  .  BASCILIC VEIN TRANSPOSITION Left 02/08/2016   Procedure: SECOND STAGE BRACHIAL VEIN TRANSPOSITION;  Surgeon: Conrad Beedeville, MD;  Location: [place];  Service: Vascular;  Laterality: Left;  . CARDIAC CATHETERIZATION N/A 07/11/2015   Procedure: Left Heart Cath and Coronary Angiography;  Surgeon: Troy Sine, MD;  Location: [place] CV LAB;  Service: Cardiovascular;  Laterality: N/A;  . Carpel Tunnel Left Dec. 22, 2016  . CHOLECYSTECTOMY    . COLONOSCOPY  2004   Dr. Irving Shows, left sided diverticula and cecal  polyp, path unknown  . COLONOSCOPY  10/29/2011   Procedure: COLONOSCOPY;  Surgeon: Daneil Dolin, MD;  [facility];  Service: Endoscopy;  Laterality: N/A;  10:15  . ESOPHAGOGASTRODUODENOSCOPY  11/2002   Dr. Gala Romney, erosive reflux esophagitis, multiple gastric ulcer and antral/bulbar erosions. Serologies positive for H.Pylori and was treated  . ESOPHAGOGASTRODUODENOSCOPY  11/20014   Dr. Gala Romney, small hh only, ulcers healed  . ESOPHAGOGASTRODUODENOSCOPY  09/21/2011   Dr Trevor Iha [facility], antral erosions, ?early GAVE  . FISTULOGRAM Left 12/10/2016   Procedure: THROMBECTOMY OF LEFT ARM ARTERIOVENOUS FISTULA;  Surgeon: Waynetta Sandy, MD;  Location: [place];  Service: Vascular;  Laterality: Left;  . INSERTION OF DIALYSIS CATHETER Left 12/10/2016   Procedure: INSERTION OF TUNNELED DIALYSIS CATHETER;  Surgeon: Waynetta Sandy, MD;  Location: [place];  Service: Vascular;  Laterality: Left;  . INSERTION OF DIALYSIS CATHETER Right 06/09/2018   Procedure: INSERTION OF DIALYSIS CATHETER Right subclavian;  Surgeon: Angelia Mould, MD;  Location: [place];  Service: Vascular;  Laterality: Right;  . IR DIALY SHUNT INTRO [place] W/IMG RIGHT Right 01/01/2018  . IR GENERIC HISTORICAL  07/16/2016   IR REMOVAL TUN CV CATH W/O FL 07/16/2016 Saverio Danker, PA-C [facility]-INTERV RAD  . IR PTA ADDL CENTRAL DIALYSIS SEG THRU DIALY CIRCUIT RIGHT Right 10/21/2017  . IR REMOVAL TUN CV CATH W/O FL  05/12/2017  . IR THROMBECTOMY AV FISTULA W/THROMBOLYSIS/PTA INC/SHUNT/IMG RIGHT Right 10/21/2017  . IR THROMBECTOMY AV FISTULA W/THROMBOLYSIS/PTA INC/SHUNT/IMG RIGHT Right 11/27/2017  . IR US GUIDE VASC ACCESS RIGHT  10/21/2017  . IR US GUIDE VASC ACCESS RIGHT  11/27/2017  . IR US GUIDE VASC ACCESS RIGHT  01/01/2018  . LIGATION ARTERIOVENOUS GORTEX GRAFT Right 06/09/2018  . LIGATION ARTERIOVENOUS GORTEX GRAFT Right 06/09/2018   Procedure: LIGATION ARTERIOVENOUS GORTEX GRAFT RIGHT ARM;  Surgeon: Angelia Mould, MD;  Location: [place];  Service: Vascular;  Laterality: Right;  . LIGATION ARTERIOVENOUS GORTEX GRAFT Left 12/31/2018   Procedure: LIGATION OF LEFT UPPER ARM ARTERIVENOUS GORTEX GRAFT, LEFT BRACHIAL ARTERY ENDARTERECTOMY WITH BOVINE PATCH ANGIOPLASTY;  Surgeon: Waynetta Sandy, MD;  Location: [place];  Service: Vascular;  Laterality: Left;  . LIGATION OF ARTERIOVENOUS  FISTULA Left 12/19/2015   Procedure: LIGATION OF RADIOCEPHALIC ARTERIOVENOUS  FISTULA;  Surgeon: Conrad Elsa, MD;  Location: [place];  Service: Vascular;  Laterality: Left;  Marland Kitchen MASS EXCISION Right 02/18/2017   Procedure: EXCISION OF RIGHT AXILLARY EPIDERMAL INCLUSION CYST;  Surgeon: Conrad Powell, MD;  Location: [place];  Service: Vascular;  Laterality: Right;  . PERIPHERAL VASCULAR CATHETERIZATION N/A 12/14/2015   Procedure: Fistulagram;  Surgeon: Conrad City View, MD;  Location: [place] CV LAB;  Service: Cardiovascular;  Laterality: N/A;  . SHOULDER SURGERY Right    fracture  . ULTRASOUND GUIDANCE FOR VASCULAR ACCESS  04/15/2017   Procedure: Ultrasound Guidance For Vascular Access;  Surgeon: Angelia Mould, MD;  Location: [place] CV LAB;  Service: Cardiovascular;;  . UPPER EXTREMITY VENOGRAPHY Bilateral 12/17/2016   Procedure: Bilateral Upper Extremity Venography;  Surgeon: Serafina Mitchell, MD;  Location: [place] CV LAB;  Service: Cardiovascular;  Laterality: Bilateral;  . UPPER EXTREMITY VENOGRAPHY Left 11/03/2018   Procedure: UPPER EXTREMITY VENOGRAPHY;  Surgeon: Serafina Mitchell, MD;  Location: [place] CV LAB;  Service: Cardiovascular;  Laterality: Left;   Family History  Problem Relation Age of Onset  . Hypertension Mother   . Colon cancer Neg Hx   . Liver disease Neg Hx    Social History:  reports that he quit smoking about 15 years ago. He quit after 25.00 years of use. He quit smokeless tobacco use about 32 years ago.  His smokeless tobacco use included chew. He reports that he does not  drink alcohol or use drugs. Allergies  Allergen Reactions  . Aspirin Other (See Comments)    Causes internal bleeding  History of ulcers   Prior to Admission medications   Medication Sig Start Date End Date Taking? Authorizing Provider  atorvastatin (LIPITOR) 40 MG tablet Take 40 mg by mouth daily. 11/03/15  Yes [provider]  b complex-vitamin c-folic acid (NEPHRO-VITE) 0.8 MG TABS Take 1 tablet by mouth See admin instructions. Take one tablet daily on Tuesdays, Thursdays, Saturdays, and Sundays.   Yes [provider]  cholecalciferol (VITAMIN D3) 25 MCG (1000 UT) tablet Take 1,000 Units by mouth daily.   Yes [provider]  cinacalcet (SENSIPAR) 60 MG tablet Take 60 mg by mouth every evening.    Yes [provider]  clopidogrel (PLAVIX) 75 MG tablet Take 75 mg by mouth daily. 05/24/19  Yes [provider]  midodrine (PROAMATINE) 10 MG tablet Take 10 mg by mouth every Monday, Wednesday, and Friday. before dialysis. 09/30/18  Yes [provider]  sevelamer (RENVELA) 800 MG tablet Take 1,600-3,200 mg by mouth See admin instructions. Take 3200  mg by mouth 3 times daily with full meals and take 1600 mg by mouth with snacks.   Yes [provider]  vitamin C (ASCORBIC ACID) 500 MG tablet Take 500 mg by mouth daily.   Yes [provider]   Current Facility-Administered Medications  Medication Dose Route Frequency Provider Last Rate Last Admin  . 0.9 %  sodium chloride infusion   Intravenous Continuous Persons, Bevely Palmer, [place]      . Derrill Memo ON 07/22/2019] acetaminophen (TYLENOL) tablet 325-650 mg  325-650 mg Oral Q6H PRN Persons, Bevely Palmer, PA      . ascorbic acid (VITAMIN C) tablet 500 mg  500 mg Oral Daily Persons, Bevely Palmer, PA   500 mg at 07/21/19 0942  . atorvastatin (LIPITOR) tablet 40 mg  40 mg Oral Daily Persons, Bevely Palmer, [place]   40 mg at 07/21/19 0942  . ceFAZolin (ANCEF) IVPB 1 g/50 mL premix  1 g Intravenous Q8H Persons,  Bevely Palmer, [place]      . cholecalciferol (VITAMIN D3) tablet 1,000 Units  1,000 Units Oral Daily Persons, Bevely Palmer, [place]   1,000 Units at 07/21/19 [phone]  . cinacalcet (SENSIPAR) tablet 60 mg  60 mg Oral QPM Persons, Bevely Palmer, [place]      . Derrill Memo ON 07/22/2019] clopidogrel (PLAVIX) tablet 75 mg  75 mg Oral Daily Persons, Bevely Palmer, PA      . docusate sodium (COLACE) capsule 100 mg  100 mg Oral BID Persons, Bevely Palmer, [place]      . HYDROmorphone (DILAUDID) injection 0.5 mg  0.5 mg Intravenous Q4H PRN Persons, Bevely Palmer, PA      . midodrine (PROAMATINE) tablet 10 mg  10 mg Oral Q M,W,F Persons, Bevely Palmer, [place]      . multivitamin (RENA-VIT) tablet 1 tablet  1 tablet Oral QHS Persons, Bevely Palmer, PA      . mupirocin ointment (BACTROBAN) 2 %           . ondansetron (ZOFRAN) tablet 4 mg  4 mg Oral Q6H PRN Persons, Bevely Palmer, PA       Or  . ondansetron [facility]) injection 4 mg  4 mg Intravenous Q6H PRN Persons, Bevely Palmer, PA      . oxyCODONE (Oxy IR/ROXICODONE) immediate release tablet 5-10 mg  5-10 mg Oral Q4H PRN Persons, Bevely Palmer, PA      . sevelamer carbonate (RENVELA) tablet 1,600 mg  1,600 mg Oral PRN [name], MD      . sevelamer carbonate (RENVELA) tablet 3,200 mg  3,200 mg Oral TID WC Persons, Bevely Palmer, [place]       Labs: Basic Metabolic Panel: Recent Labs  Lab 07/21/19 0641  NA 137  K 4.4  CL 96*  GLUCOSE 90  BUN 30*  CREATININE 9.50*   CBC: Recent Labs  Lab 07/21/19 0641  HGB 15.3  HCT 45.0    ROS: As per HPI otherwise negative. Poor historian of names of his meds Physical Exam: Vitals:   07/21/19 0858 07/21/19 0900 07/21/19 0901 07/21/19 0923  BP:   106/63 110/62  Pulse: 86 86 86 85  Resp: (!) 24 (!) 24 (!) 22 17  Temp:   98.1 F (36.7 C) (!) 97.5 F (36.4 C)  TempSrc:    Oral  SpO2: 100% 100% 100% 100%  Weight:      Height:         General: slender elderly gentleman breathing easily on room air NAD Head: NCAT sclera not icteric MMM Neck: Supple.  Lungs: CTA bilaterally  without wheezes, rales, or rhonchi. Breathing is unlabored. Heart: RRR with S1 S2.  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 Moves all extremities spontaneously. Psych:  Responds to questions appropriately with a normal affect. Dialysis Access: left thigh AVGG + bruit  Dialysis Orders:  [facility] MWF 4 hr 400/800 36 degrees 2K 2.5 Ca heparin 6000 left fem AVGG EDW 68 --leaving below 67.1 last treatment - came in below edw SBP ~100 with low net UF [name] [name] increased to 90 mg daily 3/21 per [facility] med list - no recent labs available  Assessment/Plan: 1. s/p left transmet with VAC for gangrenous foot 3/24 per Dr. Sharol Given; hx PVD 2. ESRD -  MWF - HD today - hold heparin post op 3. BP/volume  -  on midodrine - no overt volume on exam - awaiting EDW -  4. Anemia  -hgb 15.3 per H/H - no ESA warranted 5. Metabolic bone disease -  On sensipar (90 mg)per [facility] med list - will increase; and renvela - continue [name] 2 6. Nutrition - renal diet with vits/nepro 7. Recent (06/30/19) closed avulsion fx of right hip  Joseph Jacobson, PA-C [place] [phone] 07/21/2019, 10:33 AM

## 2019-07-21 NOTE — Anesthesia Procedure Notes (Signed)
Anesthesia Regional Block: Popliteal block   Pre-Anesthetic Checklist: ,, timeout performed, Correct Patient, Correct Site, Correct Laterality, Correct Procedure, Correct Position, site marked, Risks and benefits discussed, pre-op evaluation,  At surgeon's request and post-op pain management  Laterality: Left  Prep: Maximum Sterile Barrier Precautions used, chloraprep       Needles:  Injection technique: Single-shot  Needle Type: Echogenic Needle     Needle Length: 9cm  Needle Gauge: 21     Additional Needles:   Procedures:,,,, ultrasound used (permanent image in chart),,,,  Narrative:  Start time: 07/21/2019 7:19 AM End time: 07/21/2019 7:24 AM Injection made incrementally with aspirations every 5 mL.  Performed by: Personally  Anesthesiologist: Barnet Glasgow, MD  Additional Notes: Block assessed. Patient tolerated procedure well.

## 2019-07-22 DIAGNOSIS — N186 End stage renal disease: Secondary | ICD-10-CM | POA: Diagnosis not present

## 2019-07-22 DIAGNOSIS — Z20822 Contact with and (suspected) exposure to covid-19: Secondary | ICD-10-CM | POA: Diagnosis not present

## 2019-07-22 DIAGNOSIS — I120 Hypertensive chronic kidney disease with stage 5 chronic kidney disease or end stage renal disease: Secondary | ICD-10-CM | POA: Diagnosis not present

## 2019-07-22 DIAGNOSIS — E8889 Other specified metabolic disorders: Secondary | ICD-10-CM | POA: Diagnosis not present

## 2019-07-22 DIAGNOSIS — M86172 Other acute osteomyelitis, left ankle and foot: Secondary | ICD-10-CM | POA: Diagnosis not present

## 2019-07-22 DIAGNOSIS — I70261 Atherosclerosis of native arteries of extremities with gangrene, right leg: Secondary | ICD-10-CM | POA: Diagnosis not present

## 2019-07-22 MED ORDER — OXYCODONE HCL 5 MG PO TABS: 5.0000 mg | ORAL_TABLET | ORAL | 0 refills | Status: DC | PRN

## 2019-07-22 MED ORDER — ACETAMINOPHEN 325 MG PO TABS: 325.0000 mg | ORAL_TABLET | Freq: Four times a day (QID) | ORAL | Status: DC | PRN

## 2019-07-22 MED ORDER — NEPRO/CARBSTEADY PO LIQD: 237.0000 mL | Freq: Two times a day (BID) | ORAL | 0 refills | Status: AC

## 2019-07-22 MED ORDER — CHLORHEXIDINE GLUCONATE CLOTH 2 % EX PADS: 6.0000 | MEDICATED_PAD | Freq: Every day | CUTANEOUS | Status: DC

## 2019-07-22 NOTE — Progress Notes (Signed)
[name] Progress Note   Dialysis Orders: [facility] MWF 4 hr 400/800 36 degrees 2K 2.5 Ca heparin 6000 left fem AVGG EDW 68 --leaving below 67.1 last treatment - came in below edw SBP ~100 with low net UF [name] 2.5 [name] increased to 90 mg daily 3/21 per [facility] med list - no recent labs available  Assessment/Plan: 1. s/p left transmet with VAC for gangrenous foot with osteo 3/24 per Dr. Sharol Given; hx PVD 2. ESRD -  MWF -next HD Friday - anticipate d/c today but orders written  3. BP/volume  -  on midodrine - no overt volume on exam - kept even Wed 4. Anemia  -hgb 12.3  - no ESA warranted 5. Metabolic bone disease -  On sensipar (90 mg)per [facility] med list - will increase; and renvela - continue [name] 2 6. Nutrition - renal diet with vits/nepro 7. Recent (06/30/19) closed avulsion fx of right hip 8. Disp - to return to SNF Joseph Jacobson, PA-C [place] [phone] 07/22/2019,9:35 AM  LOS: 1 day   Subjective:   Doing well post op - OOB in chair. Pain controlled with meds  Objective Vitals:   07/21/19 1720 07/21/19 2208 07/22/19 0407 07/22/19 0741  BP: (!) 102/42 (!) 102/59 (!) 81/55 (!) 94/58  Pulse: (!) 47 (!) 50 70 88  Resp: 17 14 14 17   Temp: 98.2 F (36.8 C) 98.5 F (36.9 C) 98.6 F (37 C) 98.7 F (37.1 C)  TempSrc: Oral Oral Oral Oral  SpO2:  98% 97% 98%  Weight:      Height:       Physical Exam General: thin elderly male NAD on room air Heart: RRR Lungs: no rales Abdomen: + BS soft Extremities: no LE edema left foot wrapped with Sock/VAC Dialysis Access:  Left thigh AVGG + bruit   Additional Objective Labs: Basic Metabolic Panel: Recent Labs  Lab 07/21/19 0641 07/21/19 1302  NA 137 137  K 4.4 4.4  CL 96* 96*  CO2  --  29  GLUCOSE 90 109*  BUN 30* 28*  CREATININE 9.50* 8.85*  CALCIUM  --  9.7  PHOS  --  3.1   Liver Function Tests: Recent Labs  Lab 07/21/19 1302  ALBUMIN 2.3*   No results for  input(s): LIPASE, AMYLASE in the last 168 hours. CBC: Recent Labs  Lab 07/21/19 0641 07/21/19 1302  WBC  --  14.2*  HGB 15.3 12.3*  HCT 45.0 38.7*  MCV  --  66.8*  PLT  --  382   Blood Culture    Component Value Date/Time   SDES LEFT ANTECUBITAL DRAWN BY RN 06/12/2018 1146   SPECREQUEST  06/12/2018 1146    BOTTLES DRAWN AEROBIC AND ANAEROBIC Blood Culture adequate volume   CULT  06/12/2018 1146    NO GROWTH 5 DAYS Performed at [facility], [address] [place], [place] [address]    REPTSTATUS 06/17/2018 FINAL 06/12/2018 1146    Cardiac Enzymes: No results for input(s): CKTOTAL, CKMB, CKMBINDEX, TROPONINI in the last 168 hours. CBG: No results for input(s): GLUCAP in the last 168 hours. Iron Studies: No results for input(s): IRON, TIBC, TRANSFERRIN, FERRITIN in the last 72 hours. Lab Results  Component Value Date   INR 1.1 12/01/2018   INR 1.03 01/01/2018   INR 1.03 11/27/2017   Studies/Results: No results found. Medications: . sodium chloride 50 mL/hr at 07/21/19 1126   . vitamin C  500 mg Oral Daily  .  atorvastatin  40 mg Oral Daily  . Chlorhexidine Gluconate Cloth  6 each Topical Q0600  . cholecalciferol  1,000 Units Oral Daily  . cinacalcet  60 mg Oral QPM  . clopidogrel  75 mg Oral Daily  . docusate sodium  100 mg Oral BID  . doxercalciferol  2 mcg Intravenous Q M,W,F-HD  . feeding supplement (NEPRO CARB STEADY)  237 mL Oral BID BM  . midodrine  10 mg Oral Q M,W,F  . multivitamin  1 tablet Oral QHS  . sevelamer carbonate  3,200 mg Oral TID WC

## 2019-07-22 NOTE — NC FL2 (Signed)
[place] LEVEL OF CARE SCREENING TOOL     IDENTIFICATION  Patient Name: Joseph BONENBERGER Sr. Birthdate: [birth year] Sex: male Admission Date (Current Location): 07/21/2019  [facility] and [place] Number:  Herbalist and Address:  [facility] [place], [place] [address], [place], [place] [address]      Provider Number: [id number]  Attending Physician Name and Address:  [name], MD  Relative Name and Phone Number:  [name] [phone]    Current Level of Care: Hospital Recommended Level of Care: [place] Prior Approval Number:    Date Approved/Denied:   PASRR Number: [id number]  Discharge Plan: SNF    Current Diagnoses: Patient Active Problem List   Diagnosis Date Noted  . Acute osteomyelitis of left foot ([place]) 07/21/2019  . Fall   . Closed avulsion fracture of right hip ([place]) 06/30/2019  . Closed intertrochanteric fracture of right hip ([place]) 06/30/2019  . Hip fracture ([place]) 06/30/2019  . Severe protein-calorie malnutrition ([place]) 06/18/2018  . Severe sepsis ([place]) 06/12/2018  . Leukocytosis 06/12/2018  . MRSA (methicillin resistant staph aureus) culture positive 06/12/2018  . Essential hypertension 06/12/2018  . ESRD (end stage renal disease) ([place]) 06/09/2018  . Gangrene of toe of left foot ([place]) 06/04/2018  . S/P transmetatarsal amputation of foot, right ([place]) 05/09/2017  . PVD (peripheral vascular disease) ([place]) 04/15/2017  . Nonischemic cardiomyopathy ([place]) 08/15/2015  . Aortic stenosis 08/15/2015  . Moderate aortic stenosis 07/12/2015  . 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 CAD- 40-50% LAD at cath 07/11/15 07/11/2015  . Aspirin intolerance 07/11/2015  . Abnormal stress test   . ESRD on dialysis ([place]) 05/30/2015  . CTS (carpal tunnel syndrome) 02/28/2015  . PVD of LE - Dr Oneida Alar follows 08/20/2012  . Midfoot skin ulcer, right, limited to breakdown of skin ([place]) 01/16/2012  . Encounter  for screening colonoscopy 10/08/2011  . Melena 10/08/2011  . Other complications due to renal dialysis device, implant, and graft 09/24/2011    Orientation RESPIRATION BLADDER Height & Weight     Self, Time, Situation  Normal Continent, External catheter Weight: 66.5 kg Height:  5\' 5"  (165.1 cm)  BEHAVIORAL SYMPTOMS/MOOD NEUROLOGICAL BOWEL NUTRITION STATUS      Continent Diet(Refer to d/c summary)  AMBULATORY STATUS COMMUNICATION OF NEEDS Skin   Extensive Assist Verbally Wound Vac(3/24 LEFT TRANSMETATARSAL AMPUTATION, preveena wound vac)                       Personal Care Assistance Level of Assistance  Bathing, Feeding, Dressing Bathing Assistance: Maximum assistance Feeding assistance: Limited assistance Dressing Assistance: Maximum assistance     Functional Limitations Info  Sight, Speech, Hearing Sight Info: Adequate Hearing Info: Adequate Speech Info: Adequate    SPECIAL CARE FACTORS FREQUENCY  PT (By licensed PT), OT (By licensed OT)     PT Frequency: 5x/ week,  evaluate and treat OT Frequency: 5x/week, evaluate and treat            Contractures Contractures Info: Not present    Additional Factors Info  Code Status, Allergies Code Status Info: Full Code Allergies Info: Asprin           Current Medications (07/22/2019):  This is the current hospital active medication list Current Facility-Administered Medications  Medication Dose Route Frequency Provider Last Rate Last Admin  . 0.9 %  sodium chloride infusion   Intravenous Continuous Persons, Bevely Palmer, [place] 50 mL/hr at 07/21/19  [place] at 07/21/19 1126  . acetaminophen (TYLENOL) tablet 325-650 mg  325-650 mg Oral Q6H PRN Persons, Bevely Palmer, PA      . ascorbic acid (VITAMIN C) tablet 500 mg  500 mg Oral Daily Persons, Bevely Palmer, PA   500 mg at 07/22/19 5747  . atorvastatin (LIPITOR) tablet 40 mg  40 mg Oral Daily Persons, Bevely Palmer, [place]   40 mg at 07/22/19 3403  . Chlorhexidine Gluconate Cloth 2 %  PADS 6 each  6 each Topical Q0600 Alric Seton, PA-C   6 each at 07/22/19 [phone]  . cholecalciferol (VITAMIN D3) tablet 1,000 Units  1,000 Units Oral Daily Persons, Bevely Palmer, [place]   1,000 Units at 07/22/19 [phone]  . cinacalcet (SENSIPAR) tablet 60 mg  60 mg Oral QPM Alric Seton, PA-C   60 mg at 07/21/19 1721  . clopidogrel (PLAVIX) tablet 75 mg  75 mg Oral Daily Persons, Bevely Palmer, [place]   75 mg at 07/22/19 8184  . docusate sodium (COLACE) capsule 100 mg  100 mg Oral BID Persons, Bevely Palmer, PA   100 mg at 07/22/19 0853  . doxercalciferol ([name]) injection 2 mcg  2 mcg Intravenous Q M,W,F-HD Alric Seton, PA-C   2 mcg at 07/21/19 1647  . feeding supplement (NEPRO CARB STEADY) liquid 237 mL  237 mL Oral BID BM Alric Seton, PA-C   237 mL at 07/21/19 1129  . HYDROmorphone (DILAUDID) injection 0.5 mg  0.5 mg Intravenous Q4H PRN Persons, Bevely Palmer, PA      . midodrine (PROAMATINE) tablet 10 mg  10 mg Oral Q M,W,F Persons, Bevely Palmer, PA   10 mg at 07/21/19 1129  . multivitamin (RENA-VIT) tablet 1 tablet  1 tablet Oral QHS Persons, Bevely Palmer, [place]   1 tablet at 07/21/19 2149  . ondansetron (ZOFRAN) tablet 4 mg  4 mg Oral Q6H PRN Persons, Bevely Palmer, PA       Or  . ondansetron [facility]) injection 4 mg  4 mg Intravenous Q6H PRN Persons, Bevely Palmer, [place]      . oxyCODONE (Oxy IR/ROXICODONE) immediate release tablet 5-10 mg  5-10 mg Oral Q4H PRN Persons, Bevely Palmer, PA   10 mg at 07/22/19 0850  . sevelamer carbonate (RENVELA) tablet 1,600 mg  1,600 mg Oral PRN [name], MD      . sevelamer carbonate (RENVELA) tablet 3,200 mg  3,200 mg Oral TID WC Persons, Bevely Palmer, PA   3,200 mg at 07/22/19 0375     Discharge Medications: Please see discharge summary for a list of discharge medications.  Relevant Imaging Results:  Relevant Lab Results:   Additional Information SS [id number]  Sharin Mons, RN

## 2019-07-22 NOTE — Plan of Care (Signed)

## 2019-07-22 NOTE — TOC Transition Note (Signed)
Transition of Care [facility]) - CM/SW Discharge Note   Patient Details  Name: Joseph BOWEN Sr. MRN: [id number] Date of Birth: [birth year]  [facility] CM/SW Contact:  Sharin Mons, RN Phone Number: 07/22/2019, 9:36 AM   Clinical Narrative:     Patient will DC to: [facility] Anticipated DC date: 07/22/2019 Family notified: pt stated will notify family Transport by: Corey Harold   Per MD patient ready for DC today to [facility]. RN, patient, patient's family, and facility notified of DC. Discharge Summary and FL2 sent to facility. RN to call report prior to discharge [phone]). Rm B-23. DC packet on chart. Ambulance transport requested for patient.   RNCM will sign off for now as intervention is no longer needed. Please consult [place] again if new needs arise.   Final next level of care: Skilled Nursing Facility([facility] Barriers to Discharge: No Barriers Identified   Patient Goals and CMS Choice        Discharge Placement                       Discharge Plan and Services        Social Determinants of Health (SDOH) Interventions     Readmission Risk Interventions No flowsheet data found.

## 2019-07-22 NOTE — Progress Notes (Signed)
The patient is postop day #1 status post left transmetatarsal amputation.  He is doing well and is anxious to get back to his nursing facility.  Wound VAC is functioning 0 cc in canister.  He had hemodialysis yesterday.  We discussed weightbearing restrictions.  He may be discharged back to his nursing home will have to check with social worker to see if this is possible today or if per Medicare he has to stay for 3 nights we will leave prescription for pain medication in his chart

## 2019-07-22 NOTE — Progress Notes (Signed)
Called facility, gave report to [place], all questions and concerns addressed, Pt not in distress, dressing clean, dry and intact, no leakage or blockage noted on Prevena wound vac, facility made aware of Pt's wheelchair to be picked up, Pt to discharge to [facility] via [facility] with belongings.

## 2019-07-22 NOTE — Care Management Obs Status (Signed)
[place] NOTIFICATION   Patient Details  Name: Joseph MONTZ Sr. MRN: [id number] Date of Birth: 08-24-1940   Medicare Observation Status Notification Given:  Yes    Sharin Mons, RN 07/22/2019, 9:56 AM

## 2019-07-22 NOTE — Discharge Summary (Signed)
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 is an 79 y.o. male who was admitted 07/21/2019 with a chief complaint of Left Foot gangrene, with a final diagnosis of Gangrene Left Foot.  Patient was brought to the operating room on 07/21/2019 and underwent Procedure(s): LEFT TRANSMETATARSAL AMPUTATION.    Patient was given perioperative antibiotics:  Anti-infectives (From admission, onward)   Start     Dose/Rate Route Frequency Ordered Stop   07/21/19 1600  ceFAZolin (ANCEF) IVPB 1 g/50 mL premix     1 g 100 mL/hr over 30 Minutes Intravenous Every 8 hours 07/21/19 0835 07/21/19 1759    .  Patient was given sequential compression devices, early ambulation, and aspirin for DVT prophylaxis.  Recent vital signs:  Patient Vitals for the past 24 hrs:  BP Temp Temp src Pulse Resp SpO2 Weight  07/22/19 0741 (!) 94/58 98.7 F (37.1 C) Oral 88 17 98 % -  07/22/19 0407 (!) 81/55 98.6 F (37 C) Oral 70 14 97 % -  07/21/19 2208 (!) 102/59 98.5 F (36.9 C) Oral (!) 50 14 98 % -  07/21/19 1720 (!) 102/42 98.2 F (36.8 C) Oral (!) 47 17 - -  07/21/19 1645 (!) 106/46 98.9 F (37.2 C) Oral (!) 106 (!) 23 95 % 66.5 kg  07/21/19 1616 (!) 99/49 - - 90 (!) 25 - -  07/21/19 1549 (!) 105/40 - - 95 15 - -  07/21/19 1530 (!) 96/54 - - 100 19 - -  07/21/19 1500 (!) 89/52 - - 87 - - -  07/21/19 1435 (!) 88/56 - - 92 - - -  07/21/19 1430 (!) 83/49 - - 93 16 - -  07/21/19 1400 (!) 98/57 - - (!) 102 19 - -  07/21/19 1330 (!) 109/50 - - (!) 110 (!) 23 - -  07/21/19 1300 110/71 - - 93 19 - -  07/21/19 1245 108/69 98.4 F (36.9 C) Oral 88 (!) 21 - -  07/21/19 1240 109/60 98.4 F (36.9 C) Oral 87 17 99 % 66.5 kg  .  Recent laboratory studies: No  results found.  Discharge Medications:   Allergies as of 07/22/2019      Reactions   Aspirin Other (See Comments)   Causes internal bleeding  History of ulcers      Medication List    TAKE these medications   acetaminophen 325 MG tablet Commonly known as: TYLENOL Take 1-2 tablets (325-650 mg total) by mouth every 6 (six) hours as needed for mild pain (pain score 1-3 or temp > 100.5).   atorvastatin 40 MG tablet Commonly known as: LIPITOR Take 40 mg by mouth daily.   b complex-vitamin c-folic acid 0.8 MG Tabs tablet Take 1 tablet by mouth See admin instructions. Take one tablet daily on Tuesdays, Thursdays, Saturdays, and Sundays.   cholecalciferol 25 MCG (1000 UNIT) tablet Commonly known as: VITAMIN D3 Take 1,000 Units by mouth daily.   cinacalcet 60 MG tablet Commonly known as: SENSIPAR Take 60 mg by mouth every evening.   clopidogrel 75 MG tablet Commonly known as: PLAVIX Take 75 mg by mouth daily.   feeding supplement (NEPRO CARB STEADY) Liqd Take 237 mLs by mouth 2 (two) times daily between meals.  midodrine 10 MG tablet Commonly known as: PROAMATINE Take 10 mg by mouth every Monday, Wednesday, and Friday. before dialysis.   oxyCODONE 5 MG immediate release tablet Commonly known as: Oxy IR/ROXICODONE Take 1-2 tablets (5-10 mg total) by mouth every 4 (four) hours as needed for moderate pain (pain score 4-6).   sevelamer carbonate 800 MG tablet Commonly known as: RENVELA Take 1,600-3,200 mg by mouth See admin instructions. Take 3200  mg by mouth 3 times daily with full meals and take 1600 mg by mouth with snacks.   vitamin C 500 MG tablet Commonly known as: ASCORBIC ACID Take 500 mg by mouth daily.       Diagnostic Studies: DG Chest 1 View  Result Date: 06/30/2019 CLINICAL DATA:  Fall fall today EXAM: CHEST  1 VIEW COMPARISON:  August 02, 2018 FINDINGS: The heart size and mediastinal contours are within normal limits. Aortic knob calcifications. Both lungs  are clear. No acute osseous findings. Prior fixation seen in the right shoulder. IMPRESSION: No active disease. Electronically Signed   By: Prudencio Pair M.D.   On: 06/30/2019 04:54   DG Foot Complete Left  Result Date: 06/30/2019 CLINICAL DATA:  Gangrene of the toes and pain after fall EXAM: LEFT FOOT - COMPLETE 3+ VIEW COMPARISON:  None. FINDINGS: No fracture or dislocation. There is diffuse osteopenia. Erosive type change seen at the distal third and fifth phalanx. Calcaneal enthesophytes are seen. Dense vascular calcifications are noted. IMPRESSION: No acute fracture. Erosive changes with the distal third and fifth phalanx. Electronically Signed   By: Prudencio Pair M.D.   On: 06/30/2019 04:55   DG Hip Unilat W or Wo Pelvis 2-3 Views Right  Result Date: 06/30/2019 CLINICAL DATA:  Fall and hip pain EXAM: DG HIP (WITH OR WITHOUT PELVIS) 2-3V RIGHT COMPARISON:  None. FINDINGS: There is a nondisplaced fracture seen through the greater trochanter with slight impaction and a probable nondisplaced intratrochanteric fracture. The femoral head is still well seated within the acetabulum. There is diffuse osteopenia. Dense vascular calcifications are noted. IMPRESSION: Nondisplaced greater trochanter and intertrochanteric fractures. Electronically Signed   By: Prudencio Pair M.D.   On: 06/30/2019 04:53   DG FEMUR, MIN 2 VIEWS RIGHT  Result Date: 06/30/2019 CLINICAL DATA:  Fall EXAM: RIGHT FEMUR 2 VIEWS COMPARISON:  None. FINDINGS: There is nondisplaced mildly impacted fracture seen through the greater trochanter and intertrochanteric regions. No other fractures are seen. There is diffuse osteopenia. Dense vascular calcifications are noted. IMPRESSION: Nondisplaced greater trochanter and superior intertrochanteric fracture. Electronically Signed   By: Prudencio Pair M.D.   On: 06/30/2019 04:53   XR HIP UNILAT W OR W/O PELVIS 2-3 VIEWS RIGHT  Result Date: 07/15/2019 2 view radiographs of the right hip shows severe  calcification of the femoral vessels.  Patient has a nondisplaced avulsion fracture off the greater trochanter no femoral neck or intertrochanteric hip fractures   Patient benefited maximally from their hospital stay and there were no complications.     Disposition:  There are no questions and answers to display.       Discharge Instructions    Negative Pressure Wound Therapy - Incisional   Complete by: As directed    Show patient how to attach [name]    [name], MD In 1 week.   Specialty: Orthopedic Surgery Contact information: [address] [place] [address] [phone]            Signed: Stanton [name]  Anne Jaquarious Grey 07/22/2019, 9:33 AM

## 2019-07-22 NOTE — Care Management CC44 (Signed)
Condition Code 44 Documentation Completed  Patient Details  Name: [name] Sr. MRN: [id number] Date of Birth: 02/02/1941   Condition Code 44 given:  Yes Patient signature on Condition Code 44 notice:  Yes Documentation of 2 MD's agreement:  Yes Code 44 added to claim:  Yes    Sharin Mons, RN 07/22/2019, 9:56 AM

## 2019-07-22 NOTE — Progress Notes (Addendum)
Physical Therapy Evaluation Patient Details Name: Joseph ROSSER Sr. MRN: [id number] DOB: 1941-01-08 Today's Date: 07/22/2019   History of Present Illness  Pt is a 79 y/o male with PMH of R LE transmetatarsal amp, HTN, arthritis, CVA with residual L sided weakness, arthritis, anxiety, ESRD M/W/F, recent fall with R hip avulasion fx WBAT with no medical mgmt.  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   Clinical Impression  Patient admitted for above and limited by problem list below, including generalized weakness, decreased activity tolerance, TWB L LE, and impaired balance.  Patient with recent hip fx and has been residing at [facility], reports requiring some assist for ADLs and using RW with assist.  Currently requires min guard for bed mobility, and minA-CGA x2 for ambulation progression while maintaining TWB; pt required constant VCs for sequencing to maintain WB status.  He will benefit from continued PT services while admitted and after dc return to SNF to optimize independence and safety with ADLs and mobility.     Follow Up Recommendations SNF;Supervision/Assistance - 24 hour    Equipment Recommendations  Other (comment)(TBD at next level of care)    Recommendations for Other Services OT consult     Precautions / Restrictions Precautions Precautions: Fall Required Braces or Orthoses: Other Brace Other Brace: post op shoe L  Restrictions Weight Bearing Restrictions: Yes RLE Weight Bearing: Weight bearing as tolerated LLE Weight Bearing: Touchdown weight bearing Other Position/Activity Restrictions: Can be TWB      Mobility  Bed Mobility Overal bed mobility: Needs Assistance Bed Mobility: Supine to Sit;Sit to Supine     Supine to sit: Min guard Sit to supine: Min guard   General bed mobility comments: min guard with HOB elevated   Transfers Overall transfer level: Needs assistance Equipment used: Rolling walker (2  wheeled) Transfers: Sit to/from Stand Sit to Stand: Min guard;+2 safety/equipment    General transfer comment: Pt required VCs for sequencing and minG for safety  Ambulation/Gait Ambulation/Gait assistance: Min assist;Min guard Gait Distance (Feet): 60 Feet(24, 24, 12 ft with seated rest breaks in between) Assistive device: Rolling walker (2 wheeled) Gait Pattern/deviations: Step-to pattern;Decreased stance time - left;Trunk flexed Gait velocity: decreased   General Gait Details: pt reliant on bil UE support on RW to step due to WB status but fatigued quickly due to pain with need for chair pulled to him     Balance Overall balance assessment: Needs assistance Sitting-balance support: No upper extremity supported;Feet supported Sitting balance-Leahy Scale: Fair Sitting balance - Comments: EOB min guard to supervision    Standing balance support: Bilateral upper extremity supported;During functional activity Standing balance-Leahy Scale: Poor Standing balance comment: bil UE support on RW due to WB status        Pertinent Vitals/Pain Pain Assessment: Faces Faces Pain Scale: Hurts a little bit Pain Location: L foot with mobility Pain Descriptors / Indicators: Aching;Guarding;Sore;Grimacing    Home Living Family/patient expects to be discharged to:: Skilled nursing facility Living Arrangements: Other (Comment)   Type of Home: Apartment Home Access: Level entry     Home Layout: One level Home Equipment: Walker - 2 wheels;Cane - single point      Prior Function Level of Independence: Needs assistance   Gait / Transfers Assistance Needed: using RW with assist for mobility, wc for longer distances    ADL's / Homemaking Assistance Needed: some assist with ADLs (LB) from staff at sink   Comments:  uses a cane, drives to HD and cares for himself     Hand Dominance   Dominant Hand: Right    Extremity/Trunk Assessment   Upper Extremity Assessment Upper Extremity  Assessment: Defer to OT evaluation    Lower Extremity Assessment Lower Extremity Assessment: Generalized weakness;LLE deficits/detail LLE Deficits / Details: decreased ROM, strength and weight bearing due to pain       Communication   Communication: No difficulties  Cognition Arousal/Alertness: Awake/alert Behavior During Therapy: WFL for tasks assessed/performed Overall Cognitive Status: Within Functional Limits for tasks assessed        General Comments General comments (skin integrity, edema, etc.): Pt reports 2 falls in last 6 months    Exercises General Exercises - Lower Extremity Long Arc Quad: AROM;Both;10 reps Hip Flexion/Marching: AROM;Both;10 reps Heel Raises: AROM;Both;10 reps   Assessment/Plan    PT Assessment Patient needs continued PT services  PT Problem List Decreased strength;Decreased mobility;Decreased safety awareness;Decreased range of motion;Decreased activity tolerance;Decreased balance;Decreased knowledge of use of DME;Pain       PT Treatment Interventions Gait training;Balance training;Stair training;Functional mobility training;Therapeutic activities;Patient/family education;DME instruction;Therapeutic exercise    PT Goals (Current goals can be found in the Care Plan section)  Acute Rehab PT Goals Patient Stated Goal: get back to rehab and get stronger PT Goal Formulation: With patient Time For Goal Achievement: 08/05/19 Potential to Achieve Goals: Fair    Frequency Min 2X/week   Barriers to discharge Decreased caregiver support         AM-PAC PT "6 Clicks" Mobility  Outcome Measure Help needed turning from your back to your side while in a flat bed without using bedrails?: A Little Help needed moving from lying on your back to sitting on the side of a flat bed without using bedrails?: A Little Help needed moving to and from a bed to a chair (including a wheelchair)?: A Little Help needed standing up from a chair using your arms (e.g.,  wheelchair or bedside chair)?: A Little Help needed to walk in hospital room?: A Little Help needed climbing 3-5 steps with a railing? : Total 6 Click Score: 16    End of Session Equipment Utilized During Treatment: Gait belt Activity Tolerance: Patient limited by pain Patient left: in chair;with call bell/phone within reach;with chair alarm set Nurse Communication: Mobility status PT Visit Diagnosis: Other abnormalities of gait and mobility (R26.89);Difficulty in walking, not elsewhere classified (R26.2);Pain Pain - Right/Left: Left Pain - part of body: Ankle and joints of foot    Time: 0911-0929 PT Time Calculation (min) (ACUTE ONLY): 18 min   Charges:   PT Evaluation $PT Eval Low Complexity: 1 Low          Ann Held PT, DPT Acute Rehab [facility] Rehabilitation P: [phone]   Roseana Rhine A Josha Weekley 07/22/2019, 12:10 PM

## 2019-07-23 DIAGNOSIS — R5381 Other malaise: Secondary | ICD-10-CM | POA: Diagnosis not present

## 2019-07-23 DIAGNOSIS — D509 Iron deficiency anemia, unspecified: Secondary | ICD-10-CM | POA: Diagnosis not present

## 2019-07-23 DIAGNOSIS — I739 Peripheral vascular disease, unspecified: Secondary | ICD-10-CM | POA: Diagnosis not present

## 2019-07-23 DIAGNOSIS — Z992 Dependence on renal dialysis: Secondary | ICD-10-CM | POA: Diagnosis not present

## 2019-07-23 DIAGNOSIS — N2581 Secondary hyperparathyroidism of renal origin: Secondary | ICD-10-CM | POA: Diagnosis not present

## 2019-07-23 DIAGNOSIS — N186 End stage renal disease: Secondary | ICD-10-CM | POA: Diagnosis not present

## 2019-07-26 DIAGNOSIS — D509 Iron deficiency anemia, unspecified: Secondary | ICD-10-CM | POA: Diagnosis not present

## 2019-07-26 DIAGNOSIS — N2581 Secondary hyperparathyroidism of renal origin: Secondary | ICD-10-CM | POA: Diagnosis not present

## 2019-07-26 DIAGNOSIS — Z992 Dependence on renal dialysis: Secondary | ICD-10-CM | POA: Diagnosis not present

## 2019-07-26 DIAGNOSIS — N186 End stage renal disease: Secondary | ICD-10-CM | POA: Diagnosis not present

## 2019-07-27 DIAGNOSIS — I739 Peripheral vascular disease, unspecified: Secondary | ICD-10-CM | POA: Diagnosis not present

## 2019-07-28 ENCOUNTER — Inpatient Hospital Stay: Payer: Medicare Other | Admitting: Physician Assistant

## 2019-07-28 DIAGNOSIS — D509 Iron deficiency anemia, unspecified: Secondary | ICD-10-CM | POA: Diagnosis not present

## 2019-07-28 DIAGNOSIS — Z992 Dependence on renal dialysis: Secondary | ICD-10-CM | POA: Diagnosis not present

## 2019-07-28 DIAGNOSIS — N2581 Secondary hyperparathyroidism of renal origin: Secondary | ICD-10-CM | POA: Diagnosis not present

## 2019-07-28 DIAGNOSIS — N186 End stage renal disease: Secondary | ICD-10-CM | POA: Diagnosis not present

## 2019-07-29 ENCOUNTER — Other Ambulatory Visit: Payer: Self-pay

## 2019-07-29 ENCOUNTER — Encounter: Payer: Self-pay | Admitting: Orthopedic Surgery

## 2019-07-29 ENCOUNTER — Ambulatory Visit (INDEPENDENT_AMBULATORY_CARE_PROVIDER_SITE_OTHER): Payer: Medicare Other | Admitting: Orthopedic Surgery

## 2019-07-29 VITALS — Ht 65.0 in | Wt 146.0 lb

## 2019-07-29 DIAGNOSIS — Z89431 Acquired absence of right foot: Secondary | ICD-10-CM

## 2019-07-29 DIAGNOSIS — N186 End stage renal disease: Secondary | ICD-10-CM | POA: Diagnosis not present

## 2019-07-29 DIAGNOSIS — D509 Iron deficiency anemia, unspecified: Secondary | ICD-10-CM | POA: Diagnosis not present

## 2019-07-29 DIAGNOSIS — D631 Anemia in chronic kidney disease: Secondary | ICD-10-CM | POA: Diagnosis not present

## 2019-07-29 DIAGNOSIS — R7881 Bacteremia: Secondary | ICD-10-CM | POA: Diagnosis not present

## 2019-07-29 DIAGNOSIS — N2581 Secondary hyperparathyroidism of renal origin: Secondary | ICD-10-CM | POA: Diagnosis not present

## 2019-07-29 DIAGNOSIS — Z89432 Acquired absence of left foot: Secondary | ICD-10-CM

## 2019-07-29 DIAGNOSIS — Z992 Dependence on renal dialysis: Secondary | ICD-10-CM

## 2019-07-29 DIAGNOSIS — I739 Peripheral vascular disease, unspecified: Secondary | ICD-10-CM

## 2019-07-29 NOTE — Progress Notes (Signed)
Office Visit Note   Patient: Joseph DREWES Sr.           Date of Birth: 07/14/1940           MRN: [id number] Visit Date: 07/29/2019              Requested by: [name], MD [address] [place],  [place] [address] PCP: [name], MD  Chief Complaint  Patient presents with  . Left Foot - Routine Post Op    07/21/2019 Left Transmet Amputation      HPI: Patient is a 79 year old gentleman who presents 1 week status post left transmetatarsal amputation he is also status post a right transmetatarsal amputation that is healed well.  Patient states he does have pain in his foot he states he is doing well.  Assessment & Plan: Visit Diagnoses:  1. S/P transmetatarsal amputation of foot, right ([place])   2. ESRD on dialysis ([place])   3. History of transmetatarsal amputation of left foot ([place])   4. PVD (peripheral vascular disease) ([place])     Plan: We will place him in a PRAFO to unload pressure from the heel dry dressing changes daily.  Washing the foot with soap and water daily.  At follow-up will reevaluate the foot.  Discussed that with the patient's decreased circulation additional surgical intervention is a possibility with a higher level amputation.  Follow-Up Instructions: Return in about 1 week (around 08/05/2019).   Ortho Exam  Patient is alert, oriented, no adenopathy, well-dressed, normal affect, normal respiratory effort. Examination the wound edges are well approximated there is some mild maceration there is no gaping of the incision there is no ischemic changes.  He does have an ischemic ulcer over the heel which is superficial.  His foot is cool to the touch.  Imaging: No results found. No images are attached to the encounter.  Labs: Lab Results  Component Value Date   REPTSTATUS 06/17/2018 FINAL 06/12/2018   GRAMSTAIN  02/18/2017    RARE WBC PRESENT, PREDOMINANTLY PMN FEW GRAM POSITIVE COCCI    CULT  06/12/2018    NO GROWTH 5 DAYS Performed at [facility], [address] [name], [place] [address]      Lab Results  Component Value Date   ALBUMIN 2.3 (L) 07/21/2019   ALBUMIN 2.5 (L) 07/01/2019   ALBUMIN 3.7 05/28/2019    Lab Results  Component Value Date   MG 2.0 06/17/2018   MG 1.9 06/14/2018   MG 1.8 06/13/2018   No results found for: VD25OH  No results found for: PREALBUMIN CBC EXTENDED Latest Ref Rng & Units 07/21/2019 07/21/2019 07/01/2019  WBC 4.0 - 10.5 K/uL 14.2(H) - 13.3(H)  RBC 4.22 - 5.81 MIL/uL 5.79 - 6.38(H)  HGB 13.0 - 17.0 g/dL 12.3(L) 15.3 13.6  HCT 39.0 - 52.0 % 38.7(L) 45.0 42.1  PLT 150 - 400 K/uL 382 - 264  NEUTROABS 1.7 - 7.7 K/uL - - -  LYMPHSABS 0.7 - 4.0 K/uL - - -     Body mass index is 24.3 kg/m.  Orders:  No orders of the defined types were placed in this encounter.  No orders of the defined types were placed in this encounter.    Procedures: No procedures performed  Clinical Data: No additional findings.  ROS:  All other systems negative, except as noted in the HPI. Review of Systems  Objective: Vital Signs: Ht 5\' 5"  (1.651 m)   Wt 146 lb (66.2 kg)  BMI 24.30 kg/m   Specialty Comments:  No specialty comments available.  PMFS History: Patient Active Problem List   Diagnosis Date Noted  . Acute osteomyelitis of left foot ([place]) 07/21/2019  . Fall   . Closed avulsion fracture of right hip ([place]) 06/30/2019  . Closed intertrochanteric fracture of right hip ([place]) 06/30/2019  . Hip fracture ([place]) 06/30/2019  . Severe protein-calorie malnutrition ([place]) 06/18/2018  . Severe sepsis ([place]) 06/12/2018  . Leukocytosis 06/12/2018  . MRSA (methicillin resistant staph aureus) culture positive 06/12/2018  . Essential hypertension 06/12/2018  . ESRD (end stage renal disease) ([place]) 06/09/2018  . Gangrene of toe of left foot ([place]) 06/04/2018  . S/P transmetatarsal amputation of foot, right ([place]) 05/09/2017  . PVD (peripheral vascular disease) ([place]) 04/15/2017  . Nonischemic  cardiomyopathy ([place]) 08/15/2015  . Aortic stenosis 08/15/2015  . Moderate aortic stenosis 07/12/2015  . 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 CAD- 40-50% LAD at cath 07/11/15 07/11/2015  . Aspirin intolerance 07/11/2015  . Abnormal stress test   . ESRD on dialysis ([place]) 05/30/2015  . CTS (carpal tunnel syndrome) 02/28/2015  . PVD of LE - Dr Oneida Alar follows 08/20/2012  . Midfoot skin ulcer, right, limited to breakdown of skin ([place]) 01/16/2012  . Encounter for screening colonoscopy 10/08/2011  . Melena 10/08/2011  . Other complications due to renal dialysis device, implant, and graft 09/24/2011   Past Medical History:  Diagnosis Date  . Anxiety   . Aortic stenosis   . Arthritis    left hand  . CVA (cerebral infarction)   . ESRD (end stage renal disease) on dialysis [facility])    M/W/F at [facility] in [place]  . Essential hypertension    resolved with HD  . Gangrene of right foot ([place])   . Gastric ulcer 2004  . GI bleed    gastric ulcer  . Heart murmur   . History of blood transfusion   . History of cardiomyopathy    LVEF normal as of February 2017  . History of gastric ulcer   . History of stroke    Left side weakness  . Iron deficiency anemia   . Osteomyelitis ([place])   . Peripheral vascular disease ([place])   . Stroke [facility]) 2000   limp left    Family History  Problem Relation Age of Onset  . Hypertension Mother   . Colon cancer Neg Hx   . Liver disease Neg Hx     Past Surgical History:  Procedure Laterality Date  . ABDOMINAL AORTAGRAM N/A 01/24/2012   Procedure: ABDOMINAL Maxcine Ham;  Surgeon: Elam Dutch, MD;  Location: [facility] CATH LAB;  Service: Cardiovascular;  Laterality: N/A;  . ABDOMINAL AORTOGRAM W/LOWER EXTREMITY N/A 04/15/2017   Procedure: ABDOMINAL AORTOGRAM W/LOWER EXTREMITY;  Surgeon: Angelia Mould, MD;  Location: [place] CV LAB;  Service: Cardiovascular;  Laterality: N/A;  . AMPUTATION Right 05/09/2017   Procedure: RIGHT  TRANSMETATARSAL AMPUTATION;  Surgeon: [name], MD;  Location: [place];  Service: Orthopedics;  Laterality: Right;  . AMPUTATION Right 06/13/2018   Procedure: AMPUTATION RIGHT LONG FINGER;  Surgeon: Dayna Barker, MD;  Location: [place];  Service: Plastics;  Laterality: Right;  . AMPUTATION Left 07/21/2019   Procedure: LEFT TRANSMETATARSAL AMPUTATION;  Surgeon: [name], MD;  Location: [place];  Service: Orthopedics;  Laterality: Left;  . ARTERIOVENOUS GRAFT PLACEMENT Right right arm  . AV FISTULA PLACEMENT Left 08/31/2015   Procedure: ARTERIOVENOUS (AV) FISTULA CREATION- LEFT RADIOCEPHALIC;  Surgeon:  Mal Misty, MD;  Location: [place];  Service: Vascular;  Laterality: Left;  . AV FISTULA PLACEMENT Right 02/18/2017   Procedure: INSERTION OF ARTERIOVENOUS (AV) GORE-TEX GRAFT  RIGHT UPPER ARM;  Surgeon: Conrad [place]burg, MD;  Location: [place];  Service: Vascular;  Laterality: Right;  . AV FISTULA PLACEMENT Left 12/01/2018   Procedure: INSERTION OF ARTERIOVENOUS (AV) GORE-TEX GRAFT ARM;  Surgeon: Angelia Mould, MD;  Location: [place];  Service: Vascular;  Laterality: Left;  . AV FISTULA PLACEMENT Left 02/09/2019   Procedure: INSERTION OF ARTERIOVENOUS (AV) GORE-TEX GRAFT LEFT THIGH;  Surgeon: Waynetta Sandy, MD;  Location: [place];  Service: Vascular;  Laterality: Left;  . BASCILIC VEIN TRANSPOSITION Left 12/19/2015   Procedure: FIRST STAGE BRACHIAL VEIN TRANSPOSITION;  Surgeon: Conrad [place], MD;  Location: [place];  Service: Vascular;  Laterality: Left;  . BASCILIC VEIN TRANSPOSITION Left 02/08/2016   Procedure: SECOND STAGE BRACHIAL VEIN TRANSPOSITION;  Surgeon: Conrad [place], MD;  Location: [place];  Service: Vascular;  Laterality: Left;  . CARDIAC CATHETERIZATION N/A 07/11/2015   Procedure: Left Heart Cath and Coronary Angiography;  Surgeon: Troy Sine, MD;  Location: [place] CV LAB;  Service: Cardiovascular;  Laterality: N/A;  . Carpel Tunnel Left Dec. 22, 2016  .  CHOLECYSTECTOMY    . COLONOSCOPY  2004   Dr. Irving Shows, left sided diverticula and cecal polyp, path unknown  . COLONOSCOPY  10/29/2011   Procedure: COLONOSCOPY;  Surgeon: Daneil Dolin, MD;  [facility];  Service: Endoscopy;  Laterality: N/A;  10:15  . ESOPHAGOGASTRODUODENOSCOPY  11/2002   Dr. Gala [place], erosive reflux esophagitis, multiple gastric ulcer and antral/bulbar erosions. Serologies positive for H.Pylori and was treated  . ESOPHAGOGASTRODUODENOSCOPY  11/20014   Dr. Gala [place], small hh only, ulcers healed  . ESOPHAGOGASTRODUODENOSCOPY  09/21/2011   Dr Trevor Iha [facility], antral erosions, ?early GAVE  . FISTULOGRAM Left 12/10/2016   Procedure: THROMBECTOMY OF LEFT ARM ARTERIOVENOUS FISTULA;  Surgeon: Waynetta Sandy, MD;  Location: [place];  Service: Vascular;  Laterality: Left;  . INSERTION OF DIALYSIS CATHETER Left 12/10/2016   Procedure: INSERTION OF TUNNELED DIALYSIS CATHETER;  Surgeon: Waynetta Sandy, MD;  Location: [place];  Service: Vascular;  Laterality: Left;  . INSERTION OF DIALYSIS CATHETER Right 06/09/2018   Procedure: INSERTION OF DIALYSIS CATHETER Right subclavian;  Surgeon: Angelia Mould, MD;  Location: [place];  Service: Vascular;  Laterality: Right;  . IR DIALY SHUNT INTRO [place] W/IMG RIGHT Right 01/01/2018  . IR GENERIC HISTORICAL  07/16/2016   IR REMOVAL TUN CV CATH W/O FL 07/16/2016 Saverio Danker, PA-C [facility]-INTERV RAD  . IR PTA ADDL CENTRAL DIALYSIS SEG THRU DIALY CIRCUIT RIGHT Right 10/21/2017  . IR REMOVAL TUN CV CATH W/O FL  05/12/2017  . IR THROMBECTOMY AV FISTULA W/THROMBOLYSIS/PTA INC/SHUNT/IMG RIGHT Right 10/21/2017  . IR THROMBECTOMY AV FISTULA W/THROMBOLYSIS/PTA INC/SHUNT/IMG RIGHT Right 11/27/2017  . IR US GUIDE VASC ACCESS RIGHT  10/21/2017  . IR US GUIDE VASC ACCESS RIGHT  11/27/2017  . IR US GUIDE VASC ACCESS RIGHT  01/01/2018  . LIGATION ARTERIOVENOUS GORTEX GRAFT Right 06/09/2018  . LIGATION ARTERIOVENOUS GORTEX GRAFT  Right 06/09/2018   Procedure: LIGATION ARTERIOVENOUS GORTEX GRAFT RIGHT ARM;  Surgeon: Angelia Mould, MD;  Location: [place];  Service: Vascular;  Laterality: Right;  . LIGATION ARTERIOVENOUS GORTEX GRAFT Left 12/31/2018   Procedure: LIGATION OF LEFT UPPER ARM ARTERIVENOUS GORTEX GRAFT, LEFT BRACHIAL ARTERY ENDARTERECTOMY WITH BOVINE PATCH ANGIOPLASTY;  Surgeon: Waynetta Sandy, MD;  Location: [place];  Service: Vascular;  Laterality: Left;  . LIGATION OF ARTERIOVENOUS  FISTULA Left 12/19/2015   Procedure: LIGATION OF RADIOCEPHALIC ARTERIOVENOUS  FISTULA;  Surgeon: Conrad [place], MD;  Location: [place];  Service: Vascular;  Laterality: Left;  Marland Kitchen MASS EXCISION Right 02/18/2017   Procedure: EXCISION OF RIGHT AXILLARY EPIDERMAL INCLUSION CYST;  Surgeon: Conrad [place], MD;  Location: [place];  Service: Vascular;  Laterality: Right;  . PERIPHERAL VASCULAR CATHETERIZATION N/A 12/14/2015   Procedure: Fistulagram;  Surgeon: Conrad [place], MD;  Location: [place] CV LAB;  Service: Cardiovascular;  Laterality: N/A;  . SHOULDER SURGERY Right    fracture  . ULTRASOUND GUIDANCE FOR VASCULAR ACCESS  04/15/2017   Procedure: Ultrasound Guidance For Vascular Access;  Surgeon: Angelia Mould, MD;  Location: [place] CV LAB;  Service: Cardiovascular;;  . UPPER EXTREMITY VENOGRAPHY Bilateral 12/17/2016   Procedure: Bilateral Upper Extremity Venography;  Surgeon: Serafina Mitchell, MD;  Location: [place] CV LAB;  Service: Cardiovascular;  Laterality: Bilateral;  . UPPER EXTREMITY VENOGRAPHY Left 11/03/2018   Procedure: UPPER EXTREMITY VENOGRAPHY;  Surgeon: Serafina Mitchell, MD;  Location: [place] CV LAB;  Service: Cardiovascular;  Laterality: Left;   Social History   Occupational History  . Occupation: retired, Licensed conveyancer    Employer: RETIRED  Tobacco Use  . Smoking status: Former Smoker    Years: 25.00    Quit date: 04/29/2004    Years since quitting: 15.2  . Smokeless tobacco: Former  Systems developer    Types: Chew    Quit date: 01/16/1987  . Tobacco comment: quit 2006  Substance and Sexual Activity  . Alcohol use: No  . Drug use: No  . Sexual activity: Not on file

## 2019-07-30 DIAGNOSIS — R7881 Bacteremia: Secondary | ICD-10-CM | POA: Diagnosis not present

## 2019-07-30 DIAGNOSIS — D509 Iron deficiency anemia, unspecified: Secondary | ICD-10-CM | POA: Diagnosis not present

## 2019-07-30 DIAGNOSIS — D631 Anemia in chronic kidney disease: Secondary | ICD-10-CM | POA: Diagnosis not present

## 2019-07-30 DIAGNOSIS — N186 End stage renal disease: Secondary | ICD-10-CM | POA: Diagnosis not present

## 2019-07-30 DIAGNOSIS — Z992 Dependence on renal dialysis: Secondary | ICD-10-CM | POA: Diagnosis not present

## 2019-07-30 DIAGNOSIS — N2581 Secondary hyperparathyroidism of renal origin: Secondary | ICD-10-CM | POA: Diagnosis not present

## 2019-08-02 ENCOUNTER — Telehealth: Payer: Self-pay | Admitting: Radiology

## 2019-08-02 DIAGNOSIS — R7881 Bacteremia: Secondary | ICD-10-CM | POA: Diagnosis not present

## 2019-08-02 DIAGNOSIS — N186 End stage renal disease: Secondary | ICD-10-CM | POA: Diagnosis not present

## 2019-08-02 DIAGNOSIS — D509 Iron deficiency anemia, unspecified: Secondary | ICD-10-CM | POA: Diagnosis not present

## 2019-08-02 DIAGNOSIS — Z992 Dependence on renal dialysis: Secondary | ICD-10-CM | POA: Diagnosis not present

## 2019-08-02 DIAGNOSIS — D631 Anemia in chronic kidney disease: Secondary | ICD-10-CM | POA: Diagnosis not present

## 2019-08-02 DIAGNOSIS — N2581 Secondary hyperparathyroidism of renal origin: Secondary | ICD-10-CM | POA: Diagnosis not present

## 2019-08-02 NOTE — Telephone Encounter (Signed)
Zanett in calling to get order clarification.  WTB status and treat orders. Please call he back @ [phone] to advise

## 2019-08-02 NOTE — Telephone Encounter (Signed)
This pt is s/p a transmet amputation and is wearing a PRAFO for heel ulcer. Is at risk for higher level amputation due to circulation issues per last office visit. Calling for wtb status. Ok to advise ok to wtb for transfers only?

## 2019-08-03 NOTE — Telephone Encounter (Signed)
Called and Anne Ng to advise of message below.

## 2019-08-03 NOTE — Telephone Encounter (Signed)
Nonweight bearing operative foot, daily dial soap cleansing, dry dressing changes

## 2019-08-04 ENCOUNTER — Telehealth: Payer: Self-pay | Admitting: Orthopedic Surgery

## 2019-08-04 DIAGNOSIS — N2581 Secondary hyperparathyroidism of renal origin: Secondary | ICD-10-CM | POA: Diagnosis not present

## 2019-08-04 DIAGNOSIS — Z992 Dependence on renal dialysis: Secondary | ICD-10-CM | POA: Diagnosis not present

## 2019-08-04 DIAGNOSIS — N186 End stage renal disease: Secondary | ICD-10-CM | POA: Diagnosis not present

## 2019-08-04 DIAGNOSIS — D631 Anemia in chronic kidney disease: Secondary | ICD-10-CM | POA: Diagnosis not present

## 2019-08-04 DIAGNOSIS — D509 Iron deficiency anemia, unspecified: Secondary | ICD-10-CM | POA: Diagnosis not present

## 2019-08-04 DIAGNOSIS — R7881 Bacteremia: Secondary | ICD-10-CM | POA: Diagnosis not present

## 2019-08-04 NOTE — Telephone Encounter (Signed)
Will hold this message until after appt to call with updated wound care instruction.

## 2019-08-04 NOTE — Telephone Encounter (Signed)
Zennette called and stated that the patient's wound had an odor to it. He has appt tomorrow 08/05/19@9 :30. Just wanted you to be aware or call her if she needs to do something.  [phone]

## 2019-08-05 ENCOUNTER — Ambulatory Visit (INDEPENDENT_AMBULATORY_CARE_PROVIDER_SITE_OTHER): Payer: Medicare Other | Admitting: Physician Assistant

## 2019-08-05 ENCOUNTER — Encounter: Payer: Self-pay | Admitting: Physician Assistant

## 2019-08-05 ENCOUNTER — Other Ambulatory Visit: Payer: Self-pay

## 2019-08-05 VITALS — Ht 65.0 in | Wt 146.0 lb

## 2019-08-05 DIAGNOSIS — Z89432 Acquired absence of left foot: Secondary | ICD-10-CM

## 2019-08-05 NOTE — Progress Notes (Signed)
Office Visit Note   Patient: Joseph RYBARCZYK Sr.           Date of Birth: 09/01/1940           MRN: [id number] Visit Date: 08/05/2019              Requested by: [name], MD [address] [place],  [place] [address] PCP: [name], MD  Chief Complaint  Patient presents with  . Left Foot - Routine Post Op    07/21/19 left foot transmet amputation       HPI: This is a pleasant 79 year old gentleman who is now 2 weeks status post left transmetatarsal amputation.  He is also being seen for a heel ulcer.  He was last seen by Dr. Sharol Given a week ago who had concerns for healing.  He is currently in a [facility] boot and they have been doing dry dressing changes at his nursing facility  Assessment & Plan: Visit Diagnoses: No diagnosis found.  Plan: I do have warm heat healing concerns.  Because of the odor and this in the necrosis I have placed him on some doxycycline he does need to be evaluated by Dr. Sharol Given next week concerns that he may need a higher level amputation  Follow-Up Instructions: No follow-ups on file.   Ortho Exam  Patient is alert, oriented, no adenopathy, well-dressed, normal affect, normal respiratory effort. Focused examination demonstrates some wound dehiscence.  There is some foul odor.  Some of the wound edges are necrotic.  Also necrotic heel ulcer.  He does have a biphasic dorsalis pedis pulse by Doppler.  Foot is warmer today no surrounding cellulitis  Imaging: No results found. No images are attached to the encounter.  Labs: Lab Results  Component Value Date   REPTSTATUS 06/17/2018 FINAL 06/12/2018   GRAMSTAIN  02/18/2017    RARE WBC PRESENT, PREDOMINANTLY PMN FEW GRAM POSITIVE COCCI    CULT  06/12/2018    NO GROWTH 5 DAYS Performed at [facility], [address] [place], [place] [address]      Lab Results  Component Value Date   ALBUMIN 2.3 (L) 07/21/2019   ALBUMIN 2.5 (L) 07/01/2019   ALBUMIN 3.7 05/28/2019    Lab Results    Component Value Date   MG 2.0 06/17/2018   MG 1.9 06/14/2018   MG 1.8 06/13/2018   No results found for: VD25OH  No results found for: PREALBUMIN CBC EXTENDED Latest Ref Rng & Units 07/21/2019 07/21/2019 07/01/2019  WBC 4.0 - 10.5 K/uL 14.2(H) - 13.3(H)  RBC 4.22 - 5.81 MIL/uL 5.79 - 6.38(H)  HGB 13.0 - 17.0 g/dL 12.3(L) 15.3 13.6  HCT 39.0 - 52.0 % 38.7(L) 45.0 42.1  PLT 150 - 400 K/uL 382 - 264  NEUTROABS 1.7 - 7.7 K/uL - - -  LYMPHSABS 0.7 - 4.0 K/uL - - -     Body mass index is 24.3 kg/m.  Orders:  No orders of the defined types were placed in this encounter.  No orders of the defined types were placed in this encounter.    Procedures: No procedures performed  Clinical Data: No additional findings.  ROS:  All other systems negative, except as noted in the HPI. Review of Systems  Objective: Vital Signs: Ht 5\' 5"  (1.651 m)   Wt 146 lb (66.2 kg)   BMI 24.30 kg/m   Specialty Comments:  No specialty comments available.  PMFS History: Patient Active Problem List   Diagnosis Date Noted  .  Acute osteomyelitis of left foot ([place]) 07/21/2019  . Fall   . Closed avulsion fracture of right hip ([place]) 06/30/2019  . Closed intertrochanteric fracture of right hip ([place]) 06/30/2019  . Hip fracture ([place]) 06/30/2019  . Severe protein-calorie malnutrition ([place]) 06/18/2018  . Severe sepsis ([place]) 06/12/2018  . Leukocytosis 06/12/2018  . MRSA (methicillin resistant staph aureus) culture positive 06/12/2018  . Essential hypertension 06/12/2018  . ESRD (end stage renal disease) ([place]) 06/09/2018  . Gangrene of toe of left foot ([place]) 06/04/2018  . S/P transmetatarsal amputation of foot, right ([place]) 05/09/2017  . PVD (peripheral vascular disease) ([place]) 04/15/2017  . Nonischemic cardiomyopathy ([place]) 08/15/2015  . Aortic stenosis 08/15/2015  . Moderate aortic stenosis 07/12/2015  . 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 CAD- 40-50% LAD at cath 07/11/15 07/11/2015   . Aspirin intolerance 07/11/2015  . Abnormal stress test   . ESRD on dialysis ([place]) 05/30/2015  . CTS (carpal tunnel syndrome) 02/28/2015  . PVD of LE - Dr Oneida Alar follows 08/20/2012  . Midfoot skin ulcer, right, limited to breakdown of skin ([place]) 01/16/2012  . Encounter for screening colonoscopy 10/08/2011  . Melena 10/08/2011  . Other complications due to renal dialysis device, implant, and graft 09/24/2011   Past Medical History:  Diagnosis Date  . Anxiety   . Aortic stenosis   . Arthritis    left hand  . CVA (cerebral infarction)   . ESRD (end stage renal disease) on dialysis [facility])    M/W/F at [facility] in [place]  . Essential hypertension    resolved with HD  . Gangrene of right foot ([place])   . Gastric ulcer 2004  . GI bleed    gastric ulcer  . Heart murmur   . History of blood transfusion   . History of cardiomyopathy    LVEF normal as of February 2017  . History of gastric ulcer   . History of stroke    Left side weakness  . Iron deficiency anemia   . Osteomyelitis ([place])   . Peripheral vascular disease ([place])   . Stroke [facility]) 2000   limp left    Family History  Problem Relation Age of Onset  . Hypertension Mother   . Colon cancer Neg Hx   . Liver disease Neg Hx     Past Surgical History:  Procedure Laterality Date  . ABDOMINAL AORTAGRAM N/A 01/24/2012   Procedure: ABDOMINAL Maxcine Ham;  Surgeon: Elam Dutch, MD;  Location: [facility] CATH LAB;  Service: Cardiovascular;  Laterality: N/A;  . ABDOMINAL AORTOGRAM W/LOWER EXTREMITY N/A 04/15/2017   Procedure: ABDOMINAL AORTOGRAM W/LOWER EXTREMITY;  Surgeon: Angelia Mould, MD;  Location: [place] CV LAB;  Service: Cardiovascular;  Laterality: N/A;  . AMPUTATION Right 05/09/2017   Procedure: RIGHT TRANSMETATARSAL AMPUTATION;  Surgeon: [name], MD;  Location: [place];  Service: Orthopedics;  Laterality: Right;  . AMPUTATION Right 06/13/2018   Procedure: AMPUTATION RIGHT LONG FINGER;  Surgeon: Dayna Barker, MD;  Location: [place];  Service: Plastics;  Laterality: Right;  . AMPUTATION Left 07/21/2019   Procedure: LEFT TRANSMETATARSAL AMPUTATION;  Surgeon: [name], MD;  Location: [place];  Service: Orthopedics;  Laterality: Left;  . ARTERIOVENOUS GRAFT PLACEMENT Right right arm  . AV FISTULA PLACEMENT Left 08/31/2015   Procedure: ARTERIOVENOUS (AV) FISTULA CREATION- LEFT RADIOCEPHALIC;  Surgeon: Mal Misty, MD;  Location: [place];  Service: Vascular;  Laterality: Left;  . AV FISTULA PLACEMENT Right 02/18/2017   Procedure: INSERTION OF  ARTERIOVENOUS (AV) GORE-TEX GRAFT  RIGHT UPPER ARM;  Surgeon: Conrad Ozona, MD;  Location: [place];  Service: Vascular;  Laterality: Right;  . AV FISTULA PLACEMENT Left 12/01/2018   Procedure: INSERTION OF ARTERIOVENOUS (AV) GORE-TEX GRAFT ARM;  Surgeon: Angelia Mould, MD;  Location: [place];  Service: Vascular;  Laterality: Left;  . AV FISTULA PLACEMENT Left 02/09/2019   Procedure: INSERTION OF ARTERIOVENOUS (AV) GORE-TEX GRAFT LEFT THIGH;  Surgeon: Waynetta Sandy, MD;  Location: [place];  Service: Vascular;  Laterality: Left;  . BASCILIC VEIN TRANSPOSITION Left 12/19/2015   Procedure: FIRST STAGE BRACHIAL VEIN TRANSPOSITION;  Surgeon: Conrad Dearborn, MD;  Location: [place];  Service: Vascular;  Laterality: Left;  . BASCILIC VEIN TRANSPOSITION Left 02/08/2016   Procedure: SECOND STAGE BRACHIAL VEIN TRANSPOSITION;  Surgeon: Conrad Plover, MD;  Location: [place];  Service: Vascular;  Laterality: Left;  . CARDIAC CATHETERIZATION N/A 07/11/2015   Procedure: Left Heart Cath and Coronary Angiography;  Surgeon: Troy Sine, MD;  Location: [place] CV LAB;  Service: Cardiovascular;  Laterality: N/A;  . Carpel Tunnel Left Dec. 22, 2016  . CHOLECYSTECTOMY    . COLONOSCOPY  2004   Dr. Irving Shows, left sided diverticula and cecal polyp, path unknown  . COLONOSCOPY  10/29/2011   Procedure: COLONOSCOPY;  Surgeon: Daneil Dolin, MD;  [facility];   Service: Endoscopy;  Laterality: N/A;  10:15  . ESOPHAGOGASTRODUODENOSCOPY  11/2002   Dr. Gala Romney, erosive reflux esophagitis, multiple gastric ulcer and antral/bulbar erosions. Serologies positive for H.Pylori and was treated  . ESOPHAGOGASTRODUODENOSCOPY  11/20014   Dr. Gala Romney, small hh only, ulcers healed  . ESOPHAGOGASTRODUODENOSCOPY  09/21/2011   Dr Trevor Iha [facility], antral erosions, ?early GAVE  . FISTULOGRAM Left 12/10/2016   Procedure: THROMBECTOMY OF LEFT ARM ARTERIOVENOUS FISTULA;  Surgeon: Waynetta Sandy, MD;  Location: [place];  Service: Vascular;  Laterality: Left;  . INSERTION OF DIALYSIS CATHETER Left 12/10/2016   Procedure: INSERTION OF TUNNELED DIALYSIS CATHETER;  Surgeon: Waynetta Sandy, MD;  Location: [place];  Service: Vascular;  Laterality: Left;  . INSERTION OF DIALYSIS CATHETER Right 06/09/2018   Procedure: INSERTION OF DIALYSIS CATHETER Right subclavian;  Surgeon: Angelia Mould, MD;  Location: [place];  Service: Vascular;  Laterality: Right;  . IR DIALY SHUNT INTRO [place] W/IMG RIGHT Right 01/01/2018  . IR GENERIC HISTORICAL  07/16/2016   IR REMOVAL TUN CV CATH W/O FL 07/16/2016 Saverio Danker, PA-C [facility]-INTERV RAD  . IR PTA ADDL CENTRAL DIALYSIS SEG THRU DIALY CIRCUIT RIGHT Right 10/21/2017  . IR REMOVAL TUN CV CATH W/O FL  05/12/2017  . IR THROMBECTOMY AV FISTULA W/THROMBOLYSIS/PTA INC/SHUNT/IMG RIGHT Right 10/21/2017  . IR THROMBECTOMY AV FISTULA W/THROMBOLYSIS/PTA INC/SHUNT/IMG RIGHT Right 11/27/2017  . IR US GUIDE VASC ACCESS RIGHT  10/21/2017  . IR US GUIDE VASC ACCESS RIGHT  11/27/2017  . IR US GUIDE VASC ACCESS RIGHT  01/01/2018  . LIGATION ARTERIOVENOUS GORTEX GRAFT Right 06/09/2018  . LIGATION ARTERIOVENOUS GORTEX GRAFT Right 06/09/2018   Procedure: LIGATION ARTERIOVENOUS GORTEX GRAFT RIGHT ARM;  Surgeon: Angelia Mould, MD;  Location: [place];  Service: Vascular;  Laterality: Right;  . LIGATION ARTERIOVENOUS GORTEX GRAFT Left 12/31/2018     Procedure: LIGATION OF LEFT UPPER ARM ARTERIVENOUS GORTEX GRAFT, LEFT BRACHIAL ARTERY ENDARTERECTOMY WITH BOVINE PATCH ANGIOPLASTY;  Surgeon: Waynetta Sandy, MD;  Location: [place];  Service: Vascular;  Laterality: Left;  . LIGATION OF ARTERIOVENOUS  FISTULA Left 12/19/2015  Procedure: LIGATION OF RADIOCEPHALIC ARTERIOVENOUS  FISTULA;  Surgeon: Conrad [place], MD;  Location: [place];  Service: Vascular;  Laterality: Left;  Marland Kitchen MASS EXCISION Right 02/18/2017   Procedure: EXCISION OF RIGHT AXILLARY EPIDERMAL INCLUSION CYST;  Surgeon: Conrad [place], MD;  Location: [place];  Service: Vascular;  Laterality: Right;  . PERIPHERAL VASCULAR CATHETERIZATION N/A 12/14/2015   Procedure: Fistulagram;  Surgeon: Conrad , MD;  Location: [place] CV LAB;  Service: Cardiovascular;  Laterality: N/A;  . SHOULDER SURGERY Right    fracture  . ULTRASOUND GUIDANCE FOR VASCULAR ACCESS  04/15/2017   Procedure: Ultrasound Guidance For Vascular Access;  Surgeon: Angelia Mould, MD;  Location: [place] CV LAB;  Service: Cardiovascular;;  . UPPER EXTREMITY VENOGRAPHY Bilateral 12/17/2016   Procedure: Bilateral Upper Extremity Venography;  Surgeon: Serafina Mitchell, MD;  Location: [place] CV LAB;  Service: Cardiovascular;  Laterality: Bilateral;  . UPPER EXTREMITY VENOGRAPHY Left 11/03/2018   Procedure: UPPER EXTREMITY VENOGRAPHY;  Surgeon: Serafina Mitchell, MD;  Location: [place] CV LAB;  Service: Cardiovascular;  Laterality: Left;   Social History   Occupational History  . Occupation: retired, Licensed conveyancer    Employer: RETIRED  Tobacco Use  . Smoking status: Former Smoker    Years: 25.00    Quit date: 04/29/2004    Years since quitting: 15.2  . Smokeless tobacco: Former Systems developer    Types: Chew    Quit date: 01/16/1987  . Tobacco comment: quit 2006  Substance and Sexual Activity  . Alcohol use: No  . Drug use: No  . Sexual activity: Not on file

## 2019-08-06 DIAGNOSIS — N186 End stage renal disease: Secondary | ICD-10-CM | POA: Diagnosis not present

## 2019-08-06 DIAGNOSIS — I739 Peripheral vascular disease, unspecified: Secondary | ICD-10-CM | POA: Diagnosis not present

## 2019-08-06 DIAGNOSIS — L089 Local infection of the skin and subcutaneous tissue, unspecified: Secondary | ICD-10-CM | POA: Diagnosis not present

## 2019-08-06 NOTE — Telephone Encounter (Signed)
I called and lm on vm to advise that the pt is to continue with current wound care orders. He was given an rx for Doxy which he will take BID. He will come in next week to see Dr. Sharol Given and discuss the possibility of higher level amputation. To call with any questions.

## 2019-08-09 ENCOUNTER — Inpatient Hospital Stay (HOSPITAL_COMMUNITY)
Admission: EM | Admit: 2019-08-09 | Discharge: 2019-08-20 | DRG: 856 | Disposition: A | Discharge status: Skilled Nursing Facility | Payer: Medicare Other | Attending: Family Medicine | Admitting: Family Medicine

## 2019-08-09 ENCOUNTER — Encounter (HOSPITAL_COMMUNITY): Payer: Self-pay | Admitting: Emergency Medicine

## 2019-08-09 ENCOUNTER — Telehealth: Payer: Self-pay | Admitting: Physician Assistant

## 2019-08-09 DIAGNOSIS — G9341 Metabolic encephalopathy: Secondary | ICD-10-CM | POA: Diagnosis not present

## 2019-08-09 DIAGNOSIS — Z87891 Personal history of nicotine dependence: Secondary | ICD-10-CM

## 2019-08-09 DIAGNOSIS — E872 Acidosis: Secondary | ICD-10-CM | POA: Diagnosis present

## 2019-08-09 DIAGNOSIS — F419 Anxiety disorder, unspecified: Secondary | ICD-10-CM | POA: Diagnosis present

## 2019-08-09 DIAGNOSIS — Z886 Allergy status to analgesic agent status: Secondary | ICD-10-CM

## 2019-08-09 DIAGNOSIS — I739 Peripheral vascular disease, unspecified: Secondary | ICD-10-CM | POA: Diagnosis present

## 2019-08-09 DIAGNOSIS — Z7902 Long term (current) use of antithrombotics/antiplatelets: Secondary | ICD-10-CM

## 2019-08-09 DIAGNOSIS — E1151 Type 2 diabetes mellitus with diabetic peripheral angiopathy without gangrene: Secondary | ICD-10-CM | POA: Diagnosis present

## 2019-08-09 DIAGNOSIS — I472 Ventricular tachycardia: Secondary | ICD-10-CM | POA: Diagnosis present

## 2019-08-09 DIAGNOSIS — B952 Enterococcus as the cause of diseases classified elsewhere: Secondary | ICD-10-CM

## 2019-08-09 DIAGNOSIS — I700 Atherosclerosis of aorta: Secondary | ICD-10-CM | POA: Diagnosis present

## 2019-08-09 DIAGNOSIS — I4891 Unspecified atrial fibrillation: Secondary | ICD-10-CM | POA: Diagnosis present

## 2019-08-09 DIAGNOSIS — E785 Hyperlipidemia, unspecified: Secondary | ICD-10-CM | POA: Diagnosis present

## 2019-08-09 DIAGNOSIS — I69398 Other sequelae of cerebral infarction: Secondary | ICD-10-CM

## 2019-08-09 DIAGNOSIS — Z79899 Other long term (current) drug therapy: Secondary | ICD-10-CM

## 2019-08-09 DIAGNOSIS — R2689 Other abnormalities of gait and mobility: Secondary | ICD-10-CM | POA: Diagnosis present

## 2019-08-09 DIAGNOSIS — R4182 Altered mental status, unspecified: Secondary | ICD-10-CM | POA: Diagnosis not present

## 2019-08-09 DIAGNOSIS — E871 Hypo-osmolality and hyponatremia: Secondary | ICD-10-CM | POA: Diagnosis present

## 2019-08-09 DIAGNOSIS — M79672 Pain in left foot: Secondary | ICD-10-CM | POA: Diagnosis not present

## 2019-08-09 DIAGNOSIS — I5032 Chronic diastolic (congestive) heart failure: Secondary | ICD-10-CM | POA: Diagnosis present

## 2019-08-09 DIAGNOSIS — T8781 Dehiscence of amputation stump: Secondary | ICD-10-CM | POA: Diagnosis present

## 2019-08-09 DIAGNOSIS — I132 Hypertensive heart and chronic kidney disease with heart failure and with stage 5 chronic kidney disease, or end stage renal disease: Secondary | ICD-10-CM | POA: Diagnosis present

## 2019-08-09 DIAGNOSIS — D473 Essential (hemorrhagic) thrombocythemia: Secondary | ICD-10-CM | POA: Diagnosis present

## 2019-08-09 DIAGNOSIS — Z8711 Personal history of peptic ulcer disease: Secondary | ICD-10-CM

## 2019-08-09 DIAGNOSIS — Z8249 Family history of ischemic heart disease and other diseases of the circulatory system: Secondary | ICD-10-CM

## 2019-08-09 DIAGNOSIS — I088 Other rheumatic multiple valve diseases: Secondary | ICD-10-CM | POA: Diagnosis present

## 2019-08-09 DIAGNOSIS — A4181 Sepsis due to Enterococcus: Secondary | ICD-10-CM | POA: Diagnosis present

## 2019-08-09 DIAGNOSIS — D75839 Thrombocytosis, unspecified: Secondary | ICD-10-CM | POA: Diagnosis present

## 2019-08-09 DIAGNOSIS — K649 Unspecified hemorrhoids: Secondary | ICD-10-CM | POA: Diagnosis present

## 2019-08-09 DIAGNOSIS — Z992 Dependence on renal dialysis: Secondary | ICD-10-CM

## 2019-08-09 DIAGNOSIS — Z20822 Contact with and (suspected) exposure to covid-19: Secondary | ICD-10-CM | POA: Diagnosis present

## 2019-08-09 DIAGNOSIS — T8149XA Infection following a procedure, other surgical site, initial encounter: Secondary | ICD-10-CM

## 2019-08-09 DIAGNOSIS — I2510 Atherosclerotic heart disease of native coronary artery without angina pectoris: Secondary | ICD-10-CM | POA: Diagnosis present

## 2019-08-09 DIAGNOSIS — Z86010 Personal history of colonic polyps: Secondary | ICD-10-CM

## 2019-08-09 DIAGNOSIS — M19042 Primary osteoarthritis, left hand: Secondary | ICD-10-CM | POA: Diagnosis present

## 2019-08-09 DIAGNOSIS — I96 Gangrene, not elsewhere classified: Secondary | ICD-10-CM

## 2019-08-09 DIAGNOSIS — I493 Ventricular premature depolarization: Secondary | ICD-10-CM | POA: Diagnosis present

## 2019-08-09 DIAGNOSIS — T8141XA Infection following a procedure, superficial incisional surgical site, initial encounter: Secondary | ICD-10-CM | POA: Diagnosis not present

## 2019-08-09 DIAGNOSIS — R011 Cardiac murmur, unspecified: Secondary | ICD-10-CM | POA: Diagnosis present

## 2019-08-09 DIAGNOSIS — Y835 Amputation of limb(s) as the cause of abnormal reaction of the patient, or of later complication, without mention of misadventure at the time of the procedure: Secondary | ICD-10-CM | POA: Diagnosis present

## 2019-08-09 DIAGNOSIS — E1122 Type 2 diabetes mellitus with diabetic chronic kidney disease: Secondary | ICD-10-CM | POA: Diagnosis present

## 2019-08-09 DIAGNOSIS — I428 Other cardiomyopathies: Secondary | ICD-10-CM | POA: Diagnosis present

## 2019-08-09 DIAGNOSIS — J9811 Atelectasis: Secondary | ICD-10-CM | POA: Diagnosis not present

## 2019-08-09 DIAGNOSIS — R9431 Abnormal electrocardiogram [ECG] [EKG]: Secondary | ICD-10-CM | POA: Diagnosis present

## 2019-08-09 DIAGNOSIS — N2581 Secondary hyperparathyroidism of renal origin: Secondary | ICD-10-CM | POA: Diagnosis present

## 2019-08-09 DIAGNOSIS — F05 Delirium due to known physiological condition: Secondary | ICD-10-CM | POA: Diagnosis present

## 2019-08-09 DIAGNOSIS — L89152 Pressure ulcer of sacral region, stage 2: Secondary | ICD-10-CM | POA: Diagnosis present

## 2019-08-09 DIAGNOSIS — N186 End stage renal disease: Secondary | ICD-10-CM | POA: Diagnosis present

## 2019-08-09 DIAGNOSIS — E875 Hyperkalemia: Secondary | ICD-10-CM | POA: Diagnosis present

## 2019-08-09 DIAGNOSIS — R5381 Other malaise: Secondary | ICD-10-CM | POA: Diagnosis present

## 2019-08-09 DIAGNOSIS — D631 Anemia in chronic kidney disease: Secondary | ICD-10-CM | POA: Diagnosis present

## 2019-08-09 DIAGNOSIS — L089 Local infection of the skin and subcutaneous tissue, unspecified: Principal | ICD-10-CM

## 2019-08-09 LAB — CBC
HCT: 36 % — ABNORMAL LOW (ref 39.0–52.0)
Hemoglobin: 12 g/dL — ABNORMAL LOW (ref 13.0–17.0)
MCH: 20.8 pg — ABNORMAL LOW (ref 26.0–34.0)
MCHC: 33.3 g/dL (ref 30.0–36.0)
MCV: 62.4 fL — ABNORMAL LOW (ref 80.0–100.0)
Platelets: 435 10*3/uL — ABNORMAL HIGH (ref 150–400)
RBC: 5.77 MIL/uL (ref 4.22–5.81)
RDW: 18 % — ABNORMAL HIGH (ref 11.5–15.5)
WBC: 41 10*3/uL — ABNORMAL HIGH (ref 4.0–10.5)
nRBC: 0 % (ref 0.0–0.2)

## 2019-08-09 LAB — BASIC METABOLIC PANEL
Anion gap: 16 — ABNORMAL HIGH (ref 5–15)
BUN: 59 mg/dL — ABNORMAL HIGH (ref 8–23)
CO2: 23 mmol/L (ref 22–32)
Calcium: 9.7 mg/dL (ref 8.9–10.3)
Chloride: 95 mmol/L — ABNORMAL LOW (ref 98–111)
Creatinine, Ser: 9.97 mg/dL — ABNORMAL HIGH (ref 0.61–1.24)
GFR calc Af Amer: 5 mL/min — ABNORMAL LOW (ref >60)
GFR calc non Af Amer: 5 mL/min — ABNORMAL LOW (ref >60)
Glucose, Bld: 93 mg/dL (ref 70–99)
Potassium: 5.2 mmol/L — ABNORMAL HIGH (ref 3.5–5.1)
Sodium: 134 mmol/L — ABNORMAL LOW (ref 135–145)

## 2019-08-09 NOTE — ED Triage Notes (Signed)
Lab, called need redraw for CBC, clotted

## 2019-08-09 NOTE — Telephone Encounter (Signed)
Can you please pt this pt on the list per Dr. Sharol Given. This pt has been sent to the ER unresponsive at dialysis. He is a transmet that was at risk for higher level amputation

## 2019-08-09 NOTE — Telephone Encounter (Signed)
On list

## 2019-08-09 NOTE — ED Triage Notes (Signed)
BIB EMS from dialysis. Per center they believe patient to be septic... Pt had recent amputation to L foot. Only complaint is patient feels "tired"

## 2019-08-09 NOTE — Telephone Encounter (Signed)
Received a call from [place] with [facility]. Patient also has dialysis today.  Charles advised patient's heel has an odor and asked if he can start patient on an IV antibiotic. Juanda Crumble said patient is a little off today. The number to contact Juanda Crumble is [phone]

## 2019-08-10 ENCOUNTER — Emergency Department (HOSPITAL_COMMUNITY): Payer: Medicare Other

## 2019-08-10 ENCOUNTER — Inpatient Hospital Stay (HOSPITAL_COMMUNITY): Payer: Medicare Other

## 2019-08-10 ENCOUNTER — Other Ambulatory Visit: Payer: Self-pay

## 2019-08-10 DIAGNOSIS — I5032 Chronic diastolic (congestive) heart failure: Secondary | ICD-10-CM | POA: Diagnosis not present

## 2019-08-10 DIAGNOSIS — D473 Essential (hemorrhagic) thrombocythemia: Secondary | ICD-10-CM | POA: Diagnosis not present

## 2019-08-10 DIAGNOSIS — E1151 Type 2 diabetes mellitus with diabetic peripheral angiopathy without gangrene: Secondary | ICD-10-CM | POA: Diagnosis present

## 2019-08-10 DIAGNOSIS — N2581 Secondary hyperparathyroidism of renal origin: Secondary | ICD-10-CM | POA: Diagnosis not present

## 2019-08-10 DIAGNOSIS — N186 End stage renal disease: Secondary | ICD-10-CM | POA: Diagnosis not present

## 2019-08-10 DIAGNOSIS — I352 Nonrheumatic aortic (valve) stenosis with insufficiency: Secondary | ICD-10-CM | POA: Diagnosis not present

## 2019-08-10 DIAGNOSIS — R1311 Dysphagia, oral phase: Secondary | ICD-10-CM | POA: Diagnosis present

## 2019-08-10 DIAGNOSIS — I083 Combined rheumatic disorders of mitral, aortic and tricuspid valves: Secondary | ICD-10-CM | POA: Diagnosis not present

## 2019-08-10 DIAGNOSIS — D72829 Elevated white blood cell count, unspecified: Secondary | ICD-10-CM | POA: Diagnosis not present

## 2019-08-10 DIAGNOSIS — I69354 Hemiplegia and hemiparesis following cerebral infarction affecting left non-dominant side: Secondary | ICD-10-CM | POA: Diagnosis not present

## 2019-08-10 DIAGNOSIS — Y835 Amputation of limb(s) as the cause of abnormal reaction of the patient, or of later complication, without mention of misadventure at the time of the procedure: Secondary | ICD-10-CM | POA: Diagnosis present

## 2019-08-10 DIAGNOSIS — F419 Anxiety disorder, unspecified: Secondary | ICD-10-CM | POA: Diagnosis present

## 2019-08-10 DIAGNOSIS — I6930 Unspecified sequelae of cerebral infarction: Secondary | ICD-10-CM | POA: Diagnosis not present

## 2019-08-10 DIAGNOSIS — I959 Hypotension, unspecified: Secondary | ICD-10-CM | POA: Diagnosis not present

## 2019-08-10 DIAGNOSIS — A419 Sepsis, unspecified organism: Secondary | ICD-10-CM

## 2019-08-10 DIAGNOSIS — E872 Acidosis: Secondary | ICD-10-CM | POA: Diagnosis not present

## 2019-08-10 DIAGNOSIS — I350 Nonrheumatic aortic (valve) stenosis: Secondary | ICD-10-CM | POA: Diagnosis present

## 2019-08-10 DIAGNOSIS — Z992 Dependence on renal dialysis: Secondary | ICD-10-CM | POA: Diagnosis not present

## 2019-08-10 DIAGNOSIS — D75839 Thrombocytosis, unspecified: Secondary | ICD-10-CM | POA: Diagnosis present

## 2019-08-10 DIAGNOSIS — Z20822 Contact with and (suspected) exposure to covid-19: Secondary | ICD-10-CM | POA: Diagnosis not present

## 2019-08-10 DIAGNOSIS — R4182 Altered mental status, unspecified: Secondary | ICD-10-CM | POA: Diagnosis not present

## 2019-08-10 DIAGNOSIS — L89152 Pressure ulcer of sacral region, stage 2: Secondary | ICD-10-CM | POA: Diagnosis present

## 2019-08-10 DIAGNOSIS — T8149XA Infection following a procedure, other surgical site, initial encounter: Secondary | ICD-10-CM | POA: Diagnosis present

## 2019-08-10 DIAGNOSIS — G9341 Metabolic encephalopathy: Secondary | ICD-10-CM | POA: Diagnosis present

## 2019-08-10 DIAGNOSIS — I96 Gangrene, not elsewhere classified: Secondary | ICD-10-CM | POA: Diagnosis not present

## 2019-08-10 DIAGNOSIS — R5381 Other malaise: Secondary | ICD-10-CM | POA: Diagnosis not present

## 2019-08-10 DIAGNOSIS — R404 Transient alteration of awareness: Secondary | ICD-10-CM | POA: Diagnosis not present

## 2019-08-10 DIAGNOSIS — T8149XD Infection following a procedure, other surgical site, subsequent encounter: Secondary | ICD-10-CM | POA: Diagnosis present

## 2019-08-10 DIAGNOSIS — T8781 Dehiscence of amputation stump: Secondary | ICD-10-CM | POA: Diagnosis present

## 2019-08-10 DIAGNOSIS — Z87891 Personal history of nicotine dependence: Secondary | ICD-10-CM | POA: Diagnosis not present

## 2019-08-10 DIAGNOSIS — M79672 Pain in left foot: Secondary | ICD-10-CM | POA: Diagnosis not present

## 2019-08-10 DIAGNOSIS — R279 Unspecified lack of coordination: Secondary | ICD-10-CM | POA: Diagnosis not present

## 2019-08-10 DIAGNOSIS — R011 Cardiac murmur, unspecified: Secondary | ICD-10-CM | POA: Diagnosis not present

## 2019-08-10 DIAGNOSIS — I340 Nonrheumatic mitral (valve) insufficiency: Secondary | ICD-10-CM | POA: Diagnosis not present

## 2019-08-10 DIAGNOSIS — Z89422 Acquired absence of other left toe(s): Secondary | ICD-10-CM | POA: Diagnosis not present

## 2019-08-10 DIAGNOSIS — R7989 Other specified abnormal findings of blood chemistry: Secondary | ICD-10-CM | POA: Diagnosis not present

## 2019-08-10 DIAGNOSIS — E871 Hypo-osmolality and hyponatremia: Secondary | ICD-10-CM | POA: Diagnosis not present

## 2019-08-10 DIAGNOSIS — E875 Hyperkalemia: Secondary | ICD-10-CM | POA: Diagnosis present

## 2019-08-10 DIAGNOSIS — I2510 Atherosclerotic heart disease of native coronary artery without angina pectoris: Secondary | ICD-10-CM | POA: Diagnosis present

## 2019-08-10 DIAGNOSIS — T8141XA Infection following a procedure, superficial incisional surgical site, initial encounter: Secondary | ICD-10-CM | POA: Diagnosis not present

## 2019-08-10 DIAGNOSIS — I739 Peripheral vascular disease, unspecified: Secondary | ICD-10-CM | POA: Diagnosis present

## 2019-08-10 DIAGNOSIS — E1122 Type 2 diabetes mellitus with diabetic chronic kidney disease: Secondary | ICD-10-CM | POA: Diagnosis present

## 2019-08-10 DIAGNOSIS — L089 Local infection of the skin and subcutaneous tissue, unspecified: Secondary | ICD-10-CM | POA: Diagnosis present

## 2019-08-10 DIAGNOSIS — Z743 Need for continuous supervision: Secondary | ICD-10-CM | POA: Diagnosis not present

## 2019-08-10 DIAGNOSIS — I472 Ventricular tachycardia: Secondary | ICD-10-CM | POA: Diagnosis present

## 2019-08-10 DIAGNOSIS — D631 Anemia in chronic kidney disease: Secondary | ICD-10-CM | POA: Diagnosis present

## 2019-08-10 DIAGNOSIS — I493 Ventricular premature depolarization: Secondary | ICD-10-CM | POA: Diagnosis present

## 2019-08-10 DIAGNOSIS — I132 Hypertensive heart and chronic kidney disease with heart failure and with stage 5 chronic kidney disease, or end stage renal disease: Secondary | ICD-10-CM | POA: Diagnosis not present

## 2019-08-10 DIAGNOSIS — E43 Unspecified severe protein-calorie malnutrition: Secondary | ICD-10-CM | POA: Diagnosis present

## 2019-08-10 DIAGNOSIS — R2689 Other abnormalities of gait and mobility: Secondary | ICD-10-CM | POA: Diagnosis present

## 2019-08-10 DIAGNOSIS — Z4781 Encounter for orthopedic aftercare following surgical amputation: Secondary | ICD-10-CM | POA: Diagnosis not present

## 2019-08-10 DIAGNOSIS — R9431 Abnormal electrocardiogram [ECG] [EKG]: Secondary | ICD-10-CM | POA: Diagnosis present

## 2019-08-10 DIAGNOSIS — I120 Hypertensive chronic kidney disease with stage 5 chronic kidney disease or end stage renal disease: Secondary | ICD-10-CM | POA: Diagnosis not present

## 2019-08-10 DIAGNOSIS — M6281 Muscle weakness (generalized): Secondary | ICD-10-CM | POA: Diagnosis present

## 2019-08-10 DIAGNOSIS — J9811 Atelectasis: Secondary | ICD-10-CM | POA: Diagnosis not present

## 2019-08-10 DIAGNOSIS — I10 Essential (primary) hypertension: Secondary | ICD-10-CM | POA: Diagnosis present

## 2019-08-10 DIAGNOSIS — S72144D Nondisplaced intertrochanteric fracture of right femur, subsequent encounter for closed fracture with routine healing: Secondary | ICD-10-CM | POA: Diagnosis not present

## 2019-08-10 DIAGNOSIS — G9340 Encephalopathy, unspecified: Secondary | ICD-10-CM | POA: Diagnosis not present

## 2019-08-10 DIAGNOSIS — F05 Delirium due to known physiological condition: Secondary | ICD-10-CM | POA: Diagnosis not present

## 2019-08-10 DIAGNOSIS — D509 Iron deficiency anemia, unspecified: Secondary | ICD-10-CM | POA: Diagnosis not present

## 2019-08-10 DIAGNOSIS — Z978 Presence of other specified devices: Secondary | ICD-10-CM | POA: Diagnosis not present

## 2019-08-10 DIAGNOSIS — B952 Enterococcus as the cause of diseases classified elsewhere: Secondary | ICD-10-CM | POA: Diagnosis not present

## 2019-08-10 DIAGNOSIS — A4181 Sepsis due to Enterococcus: Secondary | ICD-10-CM | POA: Diagnosis not present

## 2019-08-10 DIAGNOSIS — I429 Cardiomyopathy, unspecified: Secondary | ICD-10-CM | POA: Diagnosis present

## 2019-08-10 DIAGNOSIS — R7881 Bacteremia: Secondary | ICD-10-CM | POA: Diagnosis not present

## 2019-08-10 DIAGNOSIS — I700 Atherosclerosis of aorta: Secondary | ICD-10-CM | POA: Diagnosis present

## 2019-08-10 DIAGNOSIS — I428 Other cardiomyopathies: Secondary | ICD-10-CM | POA: Diagnosis present

## 2019-08-10 DIAGNOSIS — Z886 Allergy status to analgesic agent status: Secondary | ICD-10-CM | POA: Diagnosis not present

## 2019-08-10 DIAGNOSIS — Z899 Acquired absence of limb, unspecified: Secondary | ICD-10-CM | POA: Diagnosis not present

## 2019-08-10 LAB — RENAL FUNCTION PANEL
Albumin: 1.8 g/dL — ABNORMAL LOW (ref 3.5–5.0)
Anion gap: 15 (ref 5–15)
BUN: 69 mg/dL — ABNORMAL HIGH (ref 8–23)
CO2: 25 mmol/L (ref 22–32)
Calcium: 9.7 mg/dL (ref 8.9–10.3)
Chloride: 93 mmol/L — ABNORMAL LOW (ref 98–111)
Creatinine, Ser: 10.75 mg/dL — ABNORMAL HIGH (ref 0.61–1.24)
GFR calc Af Amer: 5 mL/min — ABNORMAL LOW (ref >60)
GFR calc non Af Amer: 4 mL/min — ABNORMAL LOW (ref >60)
Glucose, Bld: 113 mg/dL — ABNORMAL HIGH (ref 70–99)
Phosphorus: 2.8 mg/dL (ref 2.5–4.6)
Potassium: 4.9 mmol/L (ref 3.5–5.1)
Sodium: 133 mmol/L — ABNORMAL LOW (ref 135–145)

## 2019-08-10 LAB — LACTIC ACID, PLASMA
Lactic Acid, Venous: 2.8 mmol/L (ref 0.5–1.9)
Lactic Acid, Venous: 2.4 mmol/L (ref 0.5–1.9)
Lactic Acid, Venous: 2.9 mmol/L (ref 0.5–1.9)

## 2019-08-10 LAB — HEPATIC FUNCTION PANEL
ALT: 21 U/L (ref 0–44)
AST: 68 U/L — ABNORMAL HIGH (ref 15–41)
Albumin: 2.1 g/dL — ABNORMAL LOW (ref 3.5–5.0)
Alkaline Phosphatase: 72 U/L (ref 38–126)
Bilirubin, Direct: 0.2 mg/dL (ref 0.0–0.2)
Indirect Bilirubin: 0.7 mg/dL (ref 0.3–0.9)
Total Bilirubin: 0.9 mg/dL (ref 0.3–1.2)
Total Protein: 7.9 g/dL (ref 6.5–8.1)

## 2019-08-10 LAB — CBG MONITORING, ED: Glucose-Capillary: 114 mg/dL — ABNORMAL HIGH (ref 70–99)

## 2019-08-10 LAB — C-REACTIVE PROTEIN: CRP: 25.7 mg/dL — ABNORMAL HIGH (ref <1.0)

## 2019-08-10 LAB — LIPASE, BLOOD: Lipase: 20 U/L (ref 11–51)

## 2019-08-10 LAB — SARS CORONAVIRUS 2 (TAT 6-24 HRS): SARS Coronavirus 2: NEGATIVE

## 2019-08-10 LAB — SEDIMENTATION RATE: Sed Rate: 36 mm/hr — ABNORMAL HIGH (ref 0–16)

## 2019-08-10 MED ORDER — PENTAFLUOROPROP-TETRAFLUOROETH EX AERO
1.0000 "application " | INHALATION_SPRAY | CUTANEOUS | Status: DC | PRN
  Filled 2019-08-10: qty 116

## 2019-08-10 MED ORDER — PIPERACILLIN-TAZOBACTAM IN DEX 2-0.25 GM/50ML IV SOLN
2.2500 g | Freq: Three times a day (TID) | INTRAVENOUS | Status: DC
  Administered 2019-08-10 – 2019-08-11 (×2): 2.25 g via INTRAVENOUS
  Filled 2019-08-10 (×4): qty 50

## 2019-08-10 MED ORDER — LIDOCAINE-PRILOCAINE 2.5-2.5 % EX CREA
1.0000 "application " | TOPICAL_CREAM | CUTANEOUS | Status: DC | PRN
  Filled 2019-08-10: qty 5

## 2019-08-10 MED ORDER — ONDANSETRON HCL 4 MG PO TABS: 4.0000 mg | ORAL_TABLET | Freq: Four times a day (QID) | ORAL | Status: DC | PRN

## 2019-08-10 MED ORDER — HEPARIN SODIUM (PORCINE) 5000 UNIT/ML IJ SOLN
5000.0000 [IU] | Freq: Three times a day (TID) | INTRAMUSCULAR | Status: DC
  Administered 2019-08-10 – 2019-08-17 (×18): 5000 [IU] via SUBCUTANEOUS
  Filled 2019-08-10 (×18): qty 1

## 2019-08-10 MED ORDER — LIDOCAINE HCL (PF) 1 % IJ SOLN: 5.0000 mL | INTRAMUSCULAR | Status: DC | PRN

## 2019-08-10 MED ORDER — VANCOMYCIN VARIABLE DOSE PER UNSTABLE RENAL FUNCTION (PHARMACIST DOSING): Status: DC

## 2019-08-10 MED ORDER — MIDODRINE HCL 5 MG PO TABS
10.0000 mg | ORAL_TABLET | ORAL | Status: DC
  Administered 2019-08-11 – 2019-08-18 (×5): 10 mg via ORAL
  Filled 2019-08-10 (×5): qty 2

## 2019-08-10 MED ORDER — SACCHAROMYCES BOULARDII 250 MG PO CAPS
250.0000 mg | ORAL_CAPSULE | Freq: Two times a day (BID) | ORAL | Status: DC
  Administered 2019-08-10 – 2019-08-20 (×18): 250 mg via ORAL
  Filled 2019-08-10 (×19): qty 1

## 2019-08-10 MED ORDER — ALTEPLASE 2 MG IJ SOLR: 2.0000 mg | Freq: Once | INTRAMUSCULAR | Status: DC | PRN

## 2019-08-10 MED ORDER — CHLORHEXIDINE GLUCONATE CLOTH 2 % EX PADS
6.0000 | MEDICATED_PAD | Freq: Every day | CUTANEOUS | Status: DC
  Administered 2019-08-12 – 2019-08-19 (×9): 6 via TOPICAL

## 2019-08-10 MED ORDER — RENA-VITE PO TABS
1.0000 | ORAL_TABLET | Freq: Every day | ORAL | Status: DC
  Administered 2019-08-10 – 2019-08-19 (×9): 1 via ORAL
  Filled 2019-08-10 (×10): qty 1

## 2019-08-10 MED ORDER — OXYCODONE HCL 5 MG PO TABS
5.0000 mg | ORAL_TABLET | ORAL | Status: DC | PRN
  Administered 2019-08-13: 10 mg via ORAL
  Administered 2019-08-14 – 2019-08-20 (×4): 5 mg via ORAL
  Filled 2019-08-10: qty 1
  Filled 2019-08-10 (×2): qty 2
  Filled 2019-08-10 (×2): qty 1

## 2019-08-10 MED ORDER — ACETAMINOPHEN 325 MG PO TABS
650.0000 mg | ORAL_TABLET | Freq: Four times a day (QID) | ORAL | Status: DC | PRN
  Administered 2019-08-11 – 2019-08-19 (×7): 650 mg via ORAL
  Filled 2019-08-10 (×5): qty 2

## 2019-08-10 MED ORDER — MELATONIN 3 MG PO TABS
3.0000 mg | ORAL_TABLET | Freq: Every day | ORAL | Status: DC
  Administered 2019-08-10 – 2019-08-19 (×9): 3 mg via ORAL
  Filled 2019-08-10 (×10): qty 1

## 2019-08-10 MED ORDER — ACETAMINOPHEN 650 MG RE SUPP: 650.0000 mg | Freq: Four times a day (QID) | RECTAL | Status: DC | PRN

## 2019-08-10 MED ORDER — CINACALCET HCL 30 MG PO TABS
60.0000 mg | ORAL_TABLET | Freq: Every evening | ORAL | Status: DC
  Administered 2019-08-10 – 2019-08-19 (×10): 60 mg via ORAL
  Filled 2019-08-10 (×10): qty 2

## 2019-08-10 MED ORDER — VANCOMYCIN HCL IN DEXTROSE 1-5 GM/200ML-% IV SOLN
1000.0000 mg | Freq: Once | INTRAVENOUS | Status: AC
  Administered 2019-08-10: 1000 mg via INTRAVENOUS
  Filled 2019-08-10: qty 200

## 2019-08-10 MED ORDER — CHLORPROMAZINE HCL 10 MG PO TABS
10.0000 mg | ORAL_TABLET | Freq: Once | ORAL | Status: AC
  Administered 2019-08-10: 10 mg via ORAL
  Filled 2019-08-10: qty 1

## 2019-08-10 MED ORDER — HEPARIN SODIUM (PORCINE) 1000 UNIT/ML DIALYSIS
40.0000 [IU]/kg | INTRAMUSCULAR | Status: DC | PRN
  Filled 2019-08-10: qty 3

## 2019-08-10 MED ORDER — ALBUTEROL SULFATE (2.5 MG/3ML) 0.083% IN NEBU: 2.5000 mg | INHALATION_SOLUTION | Freq: Four times a day (QID) | RESPIRATORY_TRACT | Status: DC | PRN

## 2019-08-10 MED ORDER — VANCOMYCIN HCL 500 MG/100ML IV SOLN
500.0000 mg | INTRAVENOUS | Status: AC
  Administered 2019-08-10: 500 mg via INTRAVENOUS
  Filled 2019-08-10: qty 100

## 2019-08-10 MED ORDER — SODIUM CHLORIDE 0.9 % IV SOLN: 100.0000 mL | INTRAVENOUS | Status: DC | PRN

## 2019-08-10 MED ORDER — PIPERACILLIN-TAZOBACTAM 3.375 G IVPB 30 MIN
3.3750 g | Freq: Once | INTRAVENOUS | Status: AC
  Administered 2019-08-10: 3.375 g via INTRAVENOUS
  Filled 2019-08-10: qty 50

## 2019-08-10 MED ORDER — HEPARIN SODIUM (PORCINE) 1000 UNIT/ML DIALYSIS
1000.0000 [IU] | INTRAMUSCULAR | Status: DC | PRN
  Filled 2019-08-10: qty 1

## 2019-08-10 MED ORDER — SEVELAMER CARBONATE 800 MG PO TABS
3200.0000 mg | ORAL_TABLET | Freq: Three times a day (TID) | ORAL | Status: DC
  Administered 2019-08-10 – 2019-08-12 (×4): 3200 mg via ORAL
  Filled 2019-08-10 (×5): qty 4

## 2019-08-10 MED ORDER — SEVELAMER CARBONATE 800 MG PO TABS
1600.0000 mg | ORAL_TABLET | ORAL | Status: DC
  Administered 2019-08-10: 1600 mg via ORAL
  Filled 2019-08-10 (×2): qty 2

## 2019-08-10 MED ORDER — ONDANSETRON HCL 4 MG/2ML IJ SOLN: 4.0000 mg | Freq: Four times a day (QID) | INTRAMUSCULAR | Status: DC | PRN

## 2019-08-10 MED ORDER — ATORVASTATIN CALCIUM 40 MG PO TABS
40.0000 mg | ORAL_TABLET | Freq: Every day | ORAL | Status: DC
  Administered 2019-08-10 – 2019-08-20 (×11): 40 mg via ORAL
  Filled 2019-08-10 (×10): qty 1

## 2019-08-10 MED ORDER — SENNOSIDES-DOCUSATE SODIUM 8.6-50 MG PO TABS
1.0000 | ORAL_TABLET | Freq: Every day | ORAL | Status: DC
  Administered 2019-08-10 – 2019-08-20 (×7): 1 via ORAL
  Filled 2019-08-10 (×7): qty 1

## 2019-08-10 MED ORDER — SEVELAMER CARBONATE 800 MG PO TABS: 1600.0000 mg | ORAL_TABLET | ORAL | Status: DC

## 2019-08-10 NOTE — ED Notes (Signed)
Two RNs attempted to start IV, unsuccessful.  IV team consulted.

## 2019-08-10 NOTE — ED Provider Notes (Signed)
[facility] EMERGENCY DEPARTMENT Provider Note   CSN: [id number] Arrival date & time: 08/09/19  1612     History Chief Complaint  Patient presents with  . Post-op Problem    Joseph Eich. is a 79 y.o. male.  79 yo M with a cc possible wound infection.  Patient was seen at dialysis and was sent over for evaluation.  They have attempted to call the orthopedic office to get started on antibiotics based on the notes in the system.  Patient is confused on my exam and has difficulty providing much history.  He is not sure if the wound on his foot is gotten much better or worse.  He is unsure if it has been smelling bad or not.  Tells me that he is here because the bed that he sleeps that is uncomfortable and it was shift change so the nurses decided to send him here.  Level 5 caveat confusion  The history is provided by the patient.  Illness Severity:  Moderate Onset quality:  Gradual Duration:  2 days Timing:  Constant Progression:  Worsening Chronicity:  New Associated symptoms: no abdominal pain, no chest pain, no congestion, no diarrhea, no fever, no headaches, no myalgias, no rash, no shortness of breath and no vomiting        Past Medical History:  Diagnosis Date  . Anxiety   . Aortic stenosis   . Arthritis    left hand  . CVA (cerebral infarction)   . ESRD (end stage renal disease) on dialysis [facility])    M/W/F at [facility] in [place]  . Essential hypertension    resolved with HD  . Gangrene of right foot ([place])   . Gastric ulcer 2004  . GI bleed    gastric ulcer  . Heart murmur   . History of blood transfusion   . History of cardiomyopathy    LVEF normal as of February 2017  . History of gastric ulcer   . History of stroke    Left side weakness  . Iron deficiency anemia   . Osteomyelitis ([place])   . Peripheral vascular disease ([place])   . Stroke [facility]) 2000   limp left    Patient Active Problem List   Diagnosis Date Noted  . Acute  osteomyelitis of left foot ([place]) 07/21/2019  . Fall   . Closed avulsion fracture of right hip ([place]) 06/30/2019  . Closed intertrochanteric fracture of right hip ([place]) 06/30/2019  . Hip fracture ([place]) 06/30/2019  . Severe protein-calorie malnutrition ([place]) 06/18/2018  . Severe sepsis ([place]) 06/12/2018  . Leukocytosis 06/12/2018  . MRSA (methicillin resistant staph aureus) culture positive 06/12/2018  . Essential hypertension 06/12/2018  . ESRD (end stage renal disease) ([place]) 06/09/2018  . Gangrene of toe of left foot ([place]) 06/04/2018  . S/P transmetatarsal amputation of foot, right ([place]) 05/09/2017  . PVD (peripheral vascular disease) ([place]) 04/15/2017  . Nonischemic cardiomyopathy ([place]) 08/15/2015  . Aortic stenosis 08/15/2015  . Moderate aortic stenosis 07/12/2015  . 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 CAD- 40-50% LAD at cath 07/11/15 07/11/2015  . Aspirin intolerance 07/11/2015  . Abnormal stress test   . ESRD on dialysis ([place]) 05/30/2015  . CTS (carpal tunnel syndrome) 02/28/2015  . PVD of LE - Dr Oneida Alar follows 08/20/2012  . Midfoot skin ulcer, right, limited to breakdown of skin ([place]) 01/16/2012  . Encounter for screening colonoscopy 10/08/2011  . Melena 10/08/2011  . Other complications due to renal  dialysis device, implant, and graft 09/24/2011    Past Surgical History:  Procedure Laterality Date  . ABDOMINAL AORTAGRAM N/A 01/24/2012   Procedure: ABDOMINAL Maxcine Ham;  Surgeon: Elam Dutch, MD;  Location: [facility] CATH LAB;  Service: Cardiovascular;  Laterality: N/A;  . ABDOMINAL AORTOGRAM W/LOWER EXTREMITY N/A 04/15/2017   Procedure: ABDOMINAL AORTOGRAM W/LOWER EXTREMITY;  Surgeon: Angelia Mould, MD;  Location: [place] CV LAB;  Service: Cardiovascular;  Laterality: N/A;  . AMPUTATION Right 05/09/2017   Procedure: RIGHT TRANSMETATARSAL AMPUTATION;  Surgeon: [name], MD;  Location: [place];  Service: Orthopedics;  Laterality: Right;  .  AMPUTATION Right 06/13/2018   Procedure: AMPUTATION RIGHT LONG FINGER;  Surgeon: Dayna Barker, MD;  Location: [place];  Service: Plastics;  Laterality: Right;  . AMPUTATION Left 07/21/2019   Procedure: LEFT TRANSMETATARSAL AMPUTATION;  Surgeon: [name], MD;  Location: [place];  Service: Orthopedics;  Laterality: Left;  . ARTERIOVENOUS GRAFT PLACEMENT Right right arm  . AV FISTULA PLACEMENT Left 08/31/2015   Procedure: ARTERIOVENOUS (AV) FISTULA CREATION- LEFT RADIOCEPHALIC;  Surgeon: Mal Misty, MD;  Location: [place];  Service: Vascular;  Laterality: Left;  . AV FISTULA PLACEMENT Right 02/18/2017   Procedure: INSERTION OF ARTERIOVENOUS (AV) GORE-TEX GRAFT  RIGHT UPPER ARM;  Surgeon: Conrad Alba, MD;  Location: [place];  Service: Vascular;  Laterality: Right;  . AV FISTULA PLACEMENT Left 12/01/2018   Procedure: INSERTION OF ARTERIOVENOUS (AV) GORE-TEX GRAFT ARM;  Surgeon: Angelia Mould, MD;  Location: [place];  Service: Vascular;  Laterality: Left;  . AV FISTULA PLACEMENT Left 02/09/2019   Procedure: INSERTION OF ARTERIOVENOUS (AV) GORE-TEX GRAFT LEFT THIGH;  Surgeon: Waynetta Sandy, MD;  Location: [place];  Service: Vascular;  Laterality: Left;  . BASCILIC VEIN TRANSPOSITION Left 12/19/2015   Procedure: FIRST STAGE BRACHIAL VEIN TRANSPOSITION;  Surgeon: Conrad Coles, MD;  Location: [place];  Service: Vascular;  Laterality: Left;  . BASCILIC VEIN TRANSPOSITION Left 02/08/2016   Procedure: SECOND STAGE BRACHIAL VEIN TRANSPOSITION;  Surgeon: Conrad Bennett, MD;  Location: [place];  Service: Vascular;  Laterality: Left;  . CARDIAC CATHETERIZATION N/A 07/11/2015   Procedure: Left Heart Cath and Coronary Angiography;  Surgeon: Troy Sine, MD;  Location: [place] CV LAB;  Service: Cardiovascular;  Laterality: N/A;  . Carpel Tunnel Left Dec. 22, 2016  . CHOLECYSTECTOMY    . COLONOSCOPY  2004   Dr. Irving Shows, left sided diverticula and cecal polyp, path unknown  . COLONOSCOPY   10/29/2011   Procedure: COLONOSCOPY;  Surgeon: Daneil Dolin, MD;  [facility];  Service: Endoscopy;  Laterality: N/A;  10:15  . ESOPHAGOGASTRODUODENOSCOPY  11/2002   Dr. Gala Romney, erosive reflux esophagitis, multiple gastric ulcer and antral/bulbar erosions. Serologies positive for H.Pylori and was treated  . ESOPHAGOGASTRODUODENOSCOPY  11/20014   Dr. Gala Romney, small hh only, ulcers healed  . ESOPHAGOGASTRODUODENOSCOPY  09/21/2011   Dr Trevor Iha [facility], antral erosions, ?early GAVE  . FISTULOGRAM Left 12/10/2016   Procedure: THROMBECTOMY OF LEFT ARM ARTERIOVENOUS FISTULA;  Surgeon: Waynetta Sandy, MD;  Location: [place];  Service: Vascular;  Laterality: Left;  . INSERTION OF DIALYSIS CATHETER Left 12/10/2016   Procedure: INSERTION OF TUNNELED DIALYSIS CATHETER;  Surgeon: Waynetta Sandy, MD;  Location: [place];  Service: Vascular;  Laterality: Left;  . INSERTION OF DIALYSIS CATHETER Right 06/09/2018   Procedure: INSERTION OF DIALYSIS CATHETER Right subclavian;  Surgeon: Angelia Mould, MD;  Location: [place];  Service: Vascular;  Laterality: Right;  . IR DIALY SHUNT INTRO NEEDLE/INTRACATH INITIAL W/IMG RIGHT Right 01/01/2018  . IR GENERIC HISTORICAL  07/16/2016   IR REMOVAL TUN CV CATH W/O FL 07/16/2016 Saverio Danker, PA-C [facility]-INTERV RAD  . IR PTA ADDL CENTRAL DIALYSIS SEG THRU DIALY CIRCUIT RIGHT Right 10/21/2017  . IR REMOVAL TUN CV CATH W/O FL  05/12/2017  . IR THROMBECTOMY AV FISTULA W/THROMBOLYSIS/PTA INC/SHUNT/IMG RIGHT Right 10/21/2017  . IR THROMBECTOMY AV FISTULA W/THROMBOLYSIS/PTA INC/SHUNT/IMG RIGHT Right 11/27/2017  . IR US GUIDE VASC ACCESS RIGHT  10/21/2017  . IR US GUIDE VASC ACCESS RIGHT  11/27/2017  . IR US GUIDE VASC ACCESS RIGHT  01/01/2018  . LIGATION ARTERIOVENOUS GORTEX GRAFT Right 06/09/2018  . LIGATION ARTERIOVENOUS GORTEX GRAFT Right 06/09/2018   Procedure: LIGATION ARTERIOVENOUS GORTEX GRAFT RIGHT ARM;  Surgeon: Angelia Mould, MD;  Location: [place];   Service: Vascular;  Laterality: Right;  . LIGATION ARTERIOVENOUS GORTEX GRAFT Left 12/31/2018   Procedure: LIGATION OF LEFT UPPER ARM ARTERIVENOUS GORTEX GRAFT, LEFT BRACHIAL ARTERY ENDARTERECTOMY WITH BOVINE PATCH ANGIOPLASTY;  Surgeon: Waynetta Sandy, MD;  Location: [place];  Service: Vascular;  Laterality: Left;  . LIGATION OF ARTERIOVENOUS  FISTULA Left 12/19/2015   Procedure: LIGATION OF RADIOCEPHALIC ARTERIOVENOUS  FISTULA;  Surgeon: Conrad Farina, MD;  Location: [place];  Service: Vascular;  Laterality: Left;  Marland Kitchen MASS EXCISION Right 02/18/2017   Procedure: EXCISION OF RIGHT AXILLARY EPIDERMAL INCLUSION CYST;  Surgeon: Conrad Angola, MD;  Location: [place];  Service: Vascular;  Laterality: Right;  . PERIPHERAL VASCULAR CATHETERIZATION N/A 12/14/2015   Procedure: Fistulagram;  Surgeon: Conrad Cawood, MD;  Location: [place] CV LAB;  Service: Cardiovascular;  Laterality: N/A;  . SHOULDER SURGERY Right    fracture  . ULTRASOUND GUIDANCE FOR VASCULAR ACCESS  04/15/2017   Procedure: Ultrasound Guidance For Vascular Access;  Surgeon: Angelia Mould, MD;  Location: [place] CV LAB;  Service: Cardiovascular;;  . UPPER EXTREMITY VENOGRAPHY Bilateral 12/17/2016   Procedure: Bilateral Upper Extremity Venography;  Surgeon: Serafina Mitchell, MD;  Location: [place] CV LAB;  Service: Cardiovascular;  Laterality: Bilateral;  . UPPER EXTREMITY VENOGRAPHY Left 11/03/2018   Procedure: UPPER EXTREMITY VENOGRAPHY;  Surgeon: Serafina Mitchell, MD;  Location: [place] CV LAB;  Service: Cardiovascular;  Laterality: Left;       Family History  Problem Relation Age of Onset  . Hypertension Mother   . Colon cancer Neg Hx   . Liver disease Neg Hx     Social History   Tobacco Use  . Smoking status: Former Smoker    Years: 25.00    Quit date: 04/29/2004    Years since quitting: 15.2  . Smokeless tobacco: Former Systems developer    Types: Chew    Quit date: 01/16/1987  . Tobacco comment: quit 2006    Substance Use Topics  . Alcohol use: No  . Drug use: No    Home Medications Prior to Admission medications   Medication Sig Start Date End Date Taking? Authorizing Provider  acetaminophen (TYLENOL) 325 MG tablet Take 1-2 tablets (325-650 mg total) by mouth every 6 (six) hours as needed for mild pain (pain score 1-3 or temp > 100.5). 07/22/19   Persons, Bevely Palmer, PA  atorvastatin (LIPITOR) 40 MG tablet Take 40 mg by mouth daily. 11/03/15   [provider]  b complex-vitamin c-folic acid (NEPHRO-VITE) 0.8 MG TABS Take 1 tablet by mouth See admin instructions. Take one tablet  daily on Tuesdays, Thursdays, Saturdays, and Sundays.    [provider]  cholecalciferol (VITAMIN D3) 25 MCG (1000 UT) tablet Take 1,000 Units by mouth daily.    [provider]  cinacalcet (SENSIPAR) 60 MG tablet Take 60 mg by mouth every evening.     [provider]  clopidogrel (PLAVIX) 75 MG tablet Take 75 mg by mouth daily. 05/24/19   [provider]  midodrine (PROAMATINE) 10 MG tablet Take 10 mg by mouth every Monday, Wednesday, and Friday. before dialysis. 09/30/18   [provider]  Nutritional Supplements (FEEDING SUPPLEMENT, NEPRO CARB STEADY,) LIQD Take 237 mLs by mouth 2 (two) times daily between meals. 07/22/19   Persons, Bevely Palmer, PA  oxyCODONE (OXY IR/ROXICODONE) 5 MG immediate release tablet Take 1-2 tablets (5-10 mg total) by mouth every 4 (four) hours as needed for moderate pain (pain score 4-6). 07/22/19   Persons, Bevely Palmer, PA  sevelamer (RENVELA) 800 MG tablet Take 1,600-3,200 mg by mouth See admin instructions. Take 3200  mg by mouth 3 times daily with full meals and take 1600 mg by mouth with snacks.    [provider]  vitamin C (ASCORBIC ACID) 500 MG tablet Take 500 mg by mouth daily.    [provider]    Allergies    Aspirin  Review of Systems   Review of Systems  Constitutional: Negative for chills and fever.  HENT:  Negative for congestion and facial swelling.   Eyes: Negative for discharge and visual disturbance.  Respiratory: Negative for shortness of breath.   Cardiovascular: Negative for chest pain and palpitations.  Gastrointestinal: Negative for abdominal pain, diarrhea and vomiting.  Musculoskeletal: Negative for arthralgias and myalgias.  Skin: Positive for wound. Negative for color change and rash.  Neurological: Negative for tremors, syncope and headaches.  Psychiatric/Behavioral: Negative for confusion and dysphoric mood.    Physical Exam Updated Vital Signs BP 108/88   Pulse (!) 103   Temp (!) 97.2 F (36.2 C) (Oral)   Resp (!) 21   SpO2 98%   Physical Exam Vitals and nursing note reviewed.  Constitutional:      Appearance: He is well-developed.  HENT:     Head: Normocephalic and atraumatic.  Eyes:     Pupils: Pupils are equal, round, and reactive to light.  Neck:     Vascular: No JVD.  Cardiovascular:     Rate and Rhythm: Normal rate and regular rhythm.     Heart sounds: No murmur. No friction rub. No gallop.   Pulmonary:     Effort: No respiratory distress.     Breath sounds: No wheezing.  Abdominal:     General: There is no distension.     Tenderness: There is no abdominal tenderness. There is no guarding or rebound.  Musculoskeletal:        General: Normal range of motion.     Cervical back: Normal range of motion and neck supple.     Comments: Foul-smelling left lower extremity.  Sutures in place to the metatarsal area, no obvious drainage.  Nontender.  Skin:    Coloration: Skin is not pale.     Findings: No rash.  Neurological:     Mental Status: He is alert and oriented to person, place, and time.  Psychiatric:        Behavior: Behavior normal.     ED Results / Procedures / Treatments   Labs (all labs ordered are listed, but only abnormal results are displayed)  Labs Reviewed  BASIC METABOLIC PANEL - Abnormal; Notable for the following components:       Result Value   Sodium 134 (*)    Potassium 5.2 (*)    Chloride 95 (*)    BUN 59 (*)    Creatinine, Ser 9.97 (*)    GFR calc non Af Amer 5 (*)    GFR calc Af Amer 5 (*)    Anion gap 16 (*)    All other components within normal limits  CBC - Abnormal; Notable for the following components:   WBC 41.0 (*)    Hemoglobin 12.0 (*)    HCT 36.0 (*)    MCV 62.4 (*)    MCH 20.8 (*)    RDW 18.0 (*)    Platelets 435 (*)    All other components within normal limits  LACTIC ACID, PLASMA - Abnormal; Notable for the following components:   Lactic Acid, Venous 2.8 (*)    All other components within normal limits  LACTIC ACID, PLASMA - Abnormal; Notable for the following components:   Lactic Acid, Venous 2.4 (*)    All other components within normal limits  HEPATIC FUNCTION PANEL - Abnormal; Notable for the following components:   Albumin 2.1 (*)    AST 68 (*)    All other components within normal limits  CBG MONITORING, ED - Abnormal; Notable for the following components:   Glucose-Capillary 114 (*)    All other components within normal limits  CULTURE, BLOOD (ROUTINE X 2)  CULTURE, BLOOD (ROUTINE X 2)  SARS CORONAVIRUS 2 (TAT 6-24 HRS)  LIPASE, BLOOD    EKG EKG Interpretation  Date/Time:  Tuesday August 10 2019 08:17:45 EDT Ventricular Rate:  87 PR Interval:    QRS Duration: 157 QT Interval:  419 QTC Calculation: 505 R Axis:   106 Text Interpretation: Sinus rhythm Atrial premature complex RBBB and LPFB No significant change since last tracing Confirmed by Deno Etienne [phone]) on 08/10/2019 9:15:00 AM   Radiology DG Chest 2 View  Result Date: 08/10/2019 CLINICAL DATA:  Hiccups, post amputation LEFT foot, hypertension, end-stage renal disease EXAM: CHEST - 2 VIEW COMPARISON:  06/30/2019 FINDINGS: Normal heart size, mediastinal contours, and pulmonary vascularity. Atherosclerotic calcification aorta. Mild chronic elevation of LEFT diaphragm with LEFT basilar atelectasis. Lungs otherwise  clear. No acute infiltrate, pleural effusion or pneumothorax. BILATERAL chronic rotator cuff tears. Prior rodding of RIGHT humerus. IMPRESSION: Chronic LEFT basilar atelectasis. No acute abnormalities. Electronically Signed   By: Lavonia Dana M.D.   On: 08/10/2019 08:19   DG Foot Complete Left  Result Date: 08/10/2019 CLINICAL DATA:  Postop, pain, discharge, odor EXAM: LEFT FOOT - COMPLETE 3+ VIEW COMPARISON:  Portable exam 0746 hours compared to 06/30/2019 FINDINGS: Interval transmetatarsal amputation. Linear bone fragment identified at amputation bed of first metatarsal. Osseous demineralization. Scattered atherosclerotic calcifications. No fracture, dislocation, or definite bone destruction. IMPRESSION: Postoperative changes of transmetatarsal amputation LEFT foot as above. If there is persistent clinical concern for osteomyelitis or soft tissue infection, consider MR. Electronically Signed   By: Lavonia Dana M.D.   On: 08/10/2019 08:16    Procedures Procedures (including critical care time)  Medications Ordered in ED Medications  chlorproMAZINE (THORAZINE) tablet 10 mg (10 mg Oral Given 08/10/19 0820)  piperacillin-tazobactam (ZOSYN) IVPB 3.375 g (0 g Intravenous Stopped 08/10/19 0935)  vancomycin (VANCOCIN) IVPB 1000 mg/200 mL premix (1,000 mg Intravenous New Bag/Given 08/10/19 8127)    ED Course  I have reviewed the triage vital  signs and the nursing notes.  Pertinent labs & imaging results that were available during my care of the patient were reviewed by me and considered in my medical decision making (see chart for details).    MDM Rules/Calculators/A&P                      79 yo M with a chief complaint of possible wound infection.  Patient was at his dialysis center and was noted to have confusion that was different than his baseline and had a foul-smelling left lower extremity.  Has had a recent amputation.  Sent to the ED for evaluation.  White blood cell count is 40,000.  Mildly  hypothermic, will start on antibiotics.  Will discuss with Ortho.  Evaluated by orthopedics, feel that he needs an BKA.  Will likely be performed tomorrow.  Dr. Sharol Given to see today.  Requesting discussion with medicine for clearance as well as admission.   The patients results and plan were reviewed and discussed.   Any x-rays performed were independently reviewed by myself.   Differential diagnosis were considered with the presenting HPI.  Medications  chlorproMAZINE (THORAZINE) tablet 10 mg (10 mg Oral Given 08/10/19 0820)  piperacillin-tazobactam (ZOSYN) IVPB 3.375 g (0 g Intravenous Stopped 08/10/19 0935)  vancomycin (VANCOCIN) IVPB 1000 mg/200 mL premix (1,000 mg Intravenous New Bag/Given 08/10/19 0838)    Vitals:   08/10/19 0535 08/10/19 0647 08/10/19 0650 08/10/19 0715  BP: (!) 155/88 (!) 128/97  108/88  Pulse: (!) 54   (!) 103  Resp: 19 12  (!) 21  Temp:   (!) 97.2 F (36.2 C)   TempSrc:   Oral   SpO2: 98%   98%    Final diagnoses:  Left foot infection    Admission/ observation were discussed with the admitting physician, patient and/or family and they are comfortable with the plan.   Final Clinical Impression(s) / ED Diagnoses Final diagnoses:  Left foot infection    Rx / DC Orders ED Discharge Orders    None       Deno Etienne, DO 08/10/19 [phone]

## 2019-08-10 NOTE — H&P (Signed)
History and Physical    Marvis Saefong KGY:[id number] DOB: 29-Sep-1940 DOA: 08/09/2019  Referring MD/NP/PA: Deno Etienne, MD PCP: [name], MD  Patient coming from: [facility] via [facility]  Chief Complaint: Altered  I have personally briefly reviewed patient's old medical records in [place]   HPI: Ole Lafon. is a 79 y.o. 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  History is limited from the patient due to his confusion and what limited history is obtained from review of records.  It appears he just underwent transmetatarsal amputation of the left foot by Dr. 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  At dialysis he was noted to be confused and they noticed that the wound on his foot was not healing well.  They attempted to call patient's orthopedics office to start antibiotics, but due to his symptoms sent him to the hospital for further evaluation.  Patient currently denies any complaints at this time, but did report that his foot had been sore.   After being able to reach the daughter over the phone she reported that the patient had been recently had a rehabilitation facility after his recent surgeries.  Normally patient is alert and oriented x3 and not confused.  ED Course: Upon admission into the emergency department patient was noted to be afebrile with pulse 84-103, respirations 12-23, blood pressure elevated up to 172/105, and O2 saturation maintained on room air.  Labs  4/12 significant for WBC 41, hemoglobin 12, platelets 435, sodium 134, potassium 5.2, BUN 59, creatinine 9.97, and lactic acid 2.8.  Chest x-ray noted chronic left basilar atelectasis.  X-rays of the left foot noted postoperative changes.  Blood cultures have been obtained.  Patient was started on [place] antibiotics of vancomycin and Zosyn.  Orthopedics was consulted  and Dr. Sharol Given plans on possible amputation of the foot in a.m.    Review of Systems  Unable to perform ROS: Mental status change  Musculoskeletal: Positive for myalgias.  Skin:       Positive for wound of foot    Past Medical History:  Diagnosis Date  . Anxiety   . Aortic stenosis   . Arthritis    left hand  . CVA (cerebral infarction)   . ESRD (end stage renal disease) on dialysis [facility])    M/W/F at [facility] in [place]  . Essential hypertension    resolved with HD  . Gangrene of right foot ([place])   . Gastric ulcer 2004  . GI bleed    gastric ulcer  . Heart murmur   . History of blood transfusion   . History of cardiomyopathy    LVEF normal as of February 2017  . History of gastric ulcer   . History of stroke    Left side weakness  . Iron deficiency anemia   . Osteomyelitis ([place])   . Peripheral vascular disease ([place])   . Stroke [facility]) 2000   limp left    Past Surgical History:  Procedure Laterality Date  . ABDOMINAL AORTAGRAM N/A 01/24/2012   Procedure: ABDOMINAL Maxcine Ham;  Surgeon: Elam Dutch, MD;  Location: [facility] CATH LAB;  Service: Cardiovascular;  Laterality: N/A;  . ABDOMINAL AORTOGRAM W/LOWER EXTREMITY N/A 04/15/2017   Procedure: ABDOMINAL AORTOGRAM W/LOWER EXTREMITY;  Surgeon: Angelia Mould, MD;  Location: [place] CV LAB;  Service: Cardiovascular;  Laterality: N/A;  .  AMPUTATION Right 05/09/2017   Procedure: RIGHT TRANSMETATARSAL AMPUTATION;  Surgeon: [name], MD;  Location: [place];  Service: Orthopedics;  Laterality: Right;  . AMPUTATION Right 06/13/2018   Procedure: AMPUTATION RIGHT LONG FINGER;  Surgeon: Dayna Barker, MD;  Location: [place];  Service: Plastics;  Laterality: Right;  . AMPUTATION Left 07/21/2019   Procedure: LEFT TRANSMETATARSAL AMPUTATION;  Surgeon: [name], MD;  Location: [place];  Service: Orthopedics;  Laterality: Left;  . ARTERIOVENOUS GRAFT PLACEMENT Right right arm  . AV FISTULA PLACEMENT Left 08/31/2015    Procedure: ARTERIOVENOUS (AV) FISTULA CREATION- LEFT RADIOCEPHALIC;  Surgeon: Mal Misty, MD;  Location: [place];  Service: Vascular;  Laterality: Left;  . AV FISTULA PLACEMENT Right 02/18/2017   Procedure: INSERTION OF ARTERIOVENOUS (AV) GORE-TEX GRAFT  RIGHT UPPER ARM;  Surgeon: Conrad Dickson, MD;  Location: [place];  Service: Vascular;  Laterality: Right;  . AV FISTULA PLACEMENT Left 12/01/2018   Procedure: INSERTION OF ARTERIOVENOUS (AV) GORE-TEX GRAFT ARM;  Surgeon: Angelia Mould, MD;  Location: [place];  Service: Vascular;  Laterality: Left;  . AV FISTULA PLACEMENT Left 02/09/2019   Procedure: INSERTION OF ARTERIOVENOUS (AV) GORE-TEX GRAFT LEFT THIGH;  Surgeon: Waynetta Sandy, MD;  Location: [place];  Service: Vascular;  Laterality: Left;  . BASCILIC VEIN TRANSPOSITION Left 12/19/2015   Procedure: FIRST STAGE BRACHIAL VEIN TRANSPOSITION;  Surgeon: Conrad [place], MD;  Location: [place];  Service: Vascular;  Laterality: Left;  . BASCILIC VEIN TRANSPOSITION Left 02/08/2016   Procedure: SECOND STAGE BRACHIAL VEIN TRANSPOSITION;  Surgeon: Conrad Naplate, MD;  Location: [place];  Service: Vascular;  Laterality: Left;  . CARDIAC CATHETERIZATION N/A 07/11/2015   Procedure: Left Heart Cath and Coronary Angiography;  Surgeon: Troy Sine, MD;  Location: [place] CV LAB;  Service: Cardiovascular;  Laterality: N/A;  . Carpel Tunnel Left Dec. 22, 2016  . CHOLECYSTECTOMY    . COLONOSCOPY  2004   Dr. Irving Shows, left sided diverticula and cecal polyp, path unknown  . COLONOSCOPY  10/29/2011   Procedure: COLONOSCOPY;  Surgeon: Daneil Dolin, MD;  [facility];  Service: Endoscopy;  Laterality: N/A;  10:15  . ESOPHAGOGASTRODUODENOSCOPY  11/2002   Dr. Gala Romney, erosive reflux esophagitis, multiple gastric ulcer and antral/bulbar erosions. Serologies positive for H.Pylori and was treated  . ESOPHAGOGASTRODUODENOSCOPY  11/20014   Dr. Gala Romney, small hh only, ulcers healed  .  ESOPHAGOGASTRODUODENOSCOPY  09/21/2011   Dr Trevor Iha [facility], antral erosions, ?early GAVE  . FISTULOGRAM Left 12/10/2016   Procedure: THROMBECTOMY OF LEFT ARM ARTERIOVENOUS FISTULA;  Surgeon: Waynetta Sandy, MD;  Location: [place];  Service: Vascular;  Laterality: Left;  . INSERTION OF DIALYSIS CATHETER Left 12/10/2016   Procedure: INSERTION OF TUNNELED DIALYSIS CATHETER;  Surgeon: Waynetta Sandy, MD;  Location: [place];  Service: Vascular;  Laterality: Left;  . INSERTION OF DIALYSIS CATHETER Right 06/09/2018   Procedure: INSERTION OF DIALYSIS CATHETER Right subclavian;  Surgeon: Angelia Mould, MD;  Location: [place];  Service: Vascular;  Laterality: Right;  . IR DIALY SHUNT INTRO [place] W/IMG RIGHT Right 01/01/2018  . IR GENERIC HISTORICAL  07/16/2016   IR REMOVAL TUN CV CATH W/O FL 07/16/2016 Saverio Danker, PA-C [facility]-INTERV RAD  . IR PTA ADDL CENTRAL DIALYSIS SEG THRU DIALY CIRCUIT RIGHT Right 10/21/2017  . IR REMOVAL TUN CV CATH W/O FL  05/12/2017  . IR THROMBECTOMY AV FISTULA W/THROMBOLYSIS/PTA INC/SHUNT/IMG RIGHT Right 10/21/2017  . IR THROMBECTOMY  AV FISTULA W/THROMBOLYSIS/PTA INC/SHUNT/IMG RIGHT Right 11/27/2017  . IR US GUIDE VASC ACCESS RIGHT  10/21/2017  . IR US GUIDE VASC ACCESS RIGHT  11/27/2017  . IR US GUIDE VASC ACCESS RIGHT  01/01/2018  . LIGATION ARTERIOVENOUS GORTEX GRAFT Right 06/09/2018  . LIGATION ARTERIOVENOUS GORTEX GRAFT Right 06/09/2018   Procedure: LIGATION ARTERIOVENOUS GORTEX GRAFT RIGHT ARM;  Surgeon: Angelia Mould, MD;  Location: [place];  Service: Vascular;  Laterality: Right;  . LIGATION ARTERIOVENOUS GORTEX GRAFT Left 12/31/2018   Procedure: LIGATION OF LEFT UPPER ARM ARTERIVENOUS GORTEX GRAFT, LEFT BRACHIAL ARTERY ENDARTERECTOMY WITH BOVINE PATCH ANGIOPLASTY;  Surgeon: Waynetta Sandy, MD;  Location: [place];  Service: Vascular;  Laterality: Left;  . LIGATION OF ARTERIOVENOUS  FISTULA Left 12/19/2015   Procedure: LIGATION OF  RADIOCEPHALIC ARTERIOVENOUS  FISTULA;  Surgeon: Conrad Verona, MD;  Location: [place];  Service: Vascular;  Laterality: Left;  Marland Kitchen MASS EXCISION Right 02/18/2017   Procedure: EXCISION OF RIGHT AXILLARY EPIDERMAL INCLUSION CYST;  Surgeon: Conrad [place], MD;  Location: [place];  Service: Vascular;  Laterality: Right;  . PERIPHERAL VASCULAR CATHETERIZATION N/A 12/14/2015   Procedure: Fistulagram;  Surgeon: Conrad [place], MD;  Location: [place] CV LAB;  Service: Cardiovascular;  Laterality: N/A;  . SHOULDER SURGERY Right    fracture  . ULTRASOUND GUIDANCE FOR VASCULAR ACCESS  04/15/2017   Procedure: Ultrasound Guidance For Vascular Access;  Surgeon: Angelia Mould, MD;  Location: [place] CV LAB;  Service: Cardiovascular;;  . UPPER EXTREMITY VENOGRAPHY Bilateral 12/17/2016   Procedure: Bilateral Upper Extremity Venography;  Surgeon: Serafina Mitchell, MD;  Location: [place] CV LAB;  Service: Cardiovascular;  Laterality: Bilateral;  . UPPER EXTREMITY VENOGRAPHY Left 11/03/2018   Procedure: UPPER EXTREMITY VENOGRAPHY;  Surgeon: Serafina Mitchell, MD;  Location: [place] CV LAB;  Service: Cardiovascular;  Laterality: Left;     reports that he quit smoking about 15 years ago. He quit after 25.00 years of use. He quit smokeless tobacco use about 32 years ago.  His smokeless tobacco use included chew. He reports that he does not drink alcohol or use drugs.  Allergies  Allergen Reactions  . Aspirin Other (See Comments)    Causes internal bleeding  History of ulcers    Family History  Problem Relation Age of Onset  . Hypertension Mother   . Colon cancer Neg Hx   . Liver disease Neg Hx     Prior to Admission medications   Medication Sig Start Date End Date Taking? Authorizing Provider  acetaminophen (TYLENOL) 325 MG tablet Take 1-2 tablets (325-650 mg total) by mouth every 6 (six) hours as needed for mild pain (pain score 1-3 or temp > 100.5). 07/22/19   Persons, Bevely Palmer, PA    atorvastatin (LIPITOR) 40 MG tablet Take 40 mg by mouth daily. 11/03/15   [provider]  b complex-vitamin c-folic acid (NEPHRO-VITE) 0.8 MG TABS Take 1 tablet by mouth See admin instructions. Take one tablet daily on Tuesdays, Thursdays, Saturdays, and Sundays.    [provider]  cholecalciferol (VITAMIN D3) 25 MCG (1000 UT) tablet Take 1,000 Units by mouth daily.    [provider]  cinacalcet (SENSIPAR) 60 MG tablet Take 60 mg by mouth every evening.     [provider]  clopidogrel (PLAVIX) 75 MG tablet Take 75 mg by mouth daily. 05/24/19   [provider]  midodrine (PROAMATINE) 10 MG tablet Take 10 mg by mouth every Monday, Wednesday,  and Friday. before dialysis. 09/30/18   [provider]  Nutritional Supplements (FEEDING SUPPLEMENT, NEPRO CARB STEADY,) LIQD Take 237 mLs by mouth 2 (two) times daily between meals. 07/22/19   Persons, Bevely Palmer, PA  oxyCODONE (OXY IR/ROXICODONE) 5 MG immediate release tablet Take 1-2 tablets (5-10 mg total) by mouth every 4 (four) hours as needed for moderate pain (pain score 4-6). 07/22/19   Persons, Bevely Palmer, PA  sevelamer (RENVELA) 800 MG tablet Take 1,600-3,200 mg by mouth See admin instructions. Take 3200  mg by mouth 3 times daily with full meals and take 1600 mg by mouth with snacks.    [provider]  vitamin C (ASCORBIC ACID) 500 MG tablet Take 500 mg by mouth daily.    [provider]    Physical Exam:  Constitutional: Elderly male who is alert and appears to be in no acute distress Vitals:   08/10/19 0647 08/10/19 0650 08/10/19 0715 08/10/19 1000  BP: (!) 128/97  108/88 (!) 120/101  Pulse:   (!) 103 98  Resp: 12  (!) 21 (!) 23  Temp:  (!) 97.2 F (36.2 C)    TempSrc:  Oral    SpO2:   98% 98%   Eyes: PERRL, lids and conjunctivae normal ENMT: Mucous membranes are moist. Posterior pharynx clear of any exudate or lesions. Neck: normal, supple, no masses, no  thyromegaly Respiratory: clear to auscultation bilaterally, no wheezing, no crackles. Normal respiratory effort. No accessory muscle use.  Cardiovascular: Regular rate and rhythm, no murmurs / rubs / gallops. No extremity edema. Abdomen: no tenderness, no masses palpated. No hepatosplenomegaly. Bowel sounds positive.  Musculoskeletal: no clubbing / cyanosis.  Third digit of the right hand and bilateral transmetatarsal amputations. Skin: Stitches in place with foul odor from left foot. Neurologic: CN 2-12 grossly intact. Sensation intact, DTR normal. Strength 5/5 in all 4.  Psychiatric: Confused.  Alert and oriented only to person.    Labs on Admission: I have personally reviewed following labs and imaging studies  CBC: Recent Labs  Lab 08/09/19 2108  WBC 41.0*  HGB 12.0*  HCT 36.0*  MCV 62.4*  PLT 161*   Basic Metabolic Panel: Recent Labs  Lab 08/09/19 1630  NA 134*  K 5.2*  CL 95*  CO2 23  GLUCOSE 93  BUN 59*  CREATININE 9.97*  CALCIUM 9.7   GFR: Estimated Creatinine Clearance: 5.5 mL/min (A) (by C-G formula based on SCr of 9.97 mg/dL (H)). Liver Function Tests: Recent Labs  Lab 08/10/19 0822  AST 68*  ALT 21  ALKPHOS 72  BILITOT 0.9  PROT 7.9  ALBUMIN 2.1*   Recent Labs  Lab 08/10/19 0822  LIPASE 20   No results for input(s): AMMONIA in the last 168 hours. Coagulation Profile: No results for input(s): INR, PROTIME in the last 168 hours. Cardiac Enzymes: No results for input(s): CKTOTAL, CKMB, CKMBINDEX, TROPONINI in the last 168 hours. BNP (last 3 results) No results for input(s): PROBNP in the last 8760 hours. HbA1C: No results for input(s): HGBA1C in the last 72 hours. CBG: Recent Labs  Lab 08/10/19 0652  GLUCAP 114*   Lipid Profile: No results for input(s): CHOL, HDL, LDLCALC, TRIG, CHOLHDL, LDLDIRECT in the last 72 hours. Thyroid Function Tests: No results for input(s): TSH, T4TOTAL, FREET4, T3FREE, THYROIDAB in the last 72 hours. Anemia  Panel: No results for input(s): VITAMINB12, FOLATE, FERRITIN, TIBC, IRON, RETICCTPCT in the last 72 hours. Urine analysis: No results found for: COLORURINE, APPEARANCEUR, [place], [place],  GLUCOSEU, HGBUR, BILIRUBINUR, KETONESUR, PROTEINUR, UROBILINOGEN, NITRITE, LEUKOCYTESUR Sepsis Labs: Recent Results (from the past 240 hour(s))  Blood culture (routine x 2)     Status: None (Preliminary result)   Collection Time: 08/10/19  4:30 AM   Specimen: BLOOD LEFT ARM  Result Value Ref Range Status   Specimen Description BLOOD LEFT ARM  Final   Special Requests   Final    BOTTLES DRAWN AEROBIC AND ANAEROBIC Blood Culture adequate volume   Culture   Final    NO GROWTH < 12 HOURS Performed at [facility], [address] [place], [place] [address]    Report Status PENDING  Incomplete  Blood culture (routine x 2)     Status: None (Preliminary result)   Collection Time: 08/10/19  4:55 AM   Specimen: BLOOD RIGHT ARM  Result Value Ref Range Status   Specimen Description BLOOD RIGHT ARM  Final   Special Requests   Final    BOTTLES DRAWN AEROBIC ONLY Blood Culture adequate volume   Culture   Final    NO GROWTH < 12 HOURS Performed at [facility], [place] [address], [place], [place] [address]    Report Status PENDING  Incomplete     Radiological Exams on Admission: DG Chest 2 View  Result Date: 08/10/2019 CLINICAL DATA:  Hiccups, post amputation LEFT foot, hypertension, end-stage renal disease EXAM: CHEST - 2 VIEW COMPARISON:  06/30/2019 FINDINGS: Normal heart size, mediastinal contours, and pulmonary vascularity. Atherosclerotic calcification aorta. Mild chronic elevation of LEFT diaphragm with LEFT basilar atelectasis. Lungs otherwise clear. No acute infiltrate, pleural effusion or pneumothorax. BILATERAL chronic rotator cuff tears. Prior rodding of RIGHT humerus. IMPRESSION: Chronic LEFT basilar atelectasis. No acute abnormalities. Electronically Signed   By: Lavonia Dana M.D.   On:  08/10/2019 08:19   DG Foot Complete Left  Result Date: 08/10/2019 CLINICAL DATA:  Postop, pain, discharge, odor EXAM: LEFT FOOT - COMPLETE 3+ VIEW COMPARISON:  Portable exam 0746 hours compared to 06/30/2019 FINDINGS: Interval transmetatarsal amputation. Linear bone fragment identified at amputation bed of first metatarsal. Osseous demineralization. Scattered atherosclerotic calcifications. No fracture, dislocation, or definite bone destruction. IMPRESSION: Postoperative changes of transmetatarsal amputation LEFT foot as above. If there is persistent clinical concern for osteomyelitis or soft tissue infection, consider MR. Electronically Signed   By: Lavonia Dana M.D.   On: 08/10/2019 08:16    EKG: Independently reviewed.  Sinus rhythm at 87 bpm with QTc 505  Assessment/Plan Sepsis secondary to postoperative wound infection: Patient had just recently underwent transmetatarsal amputation of the left foot for osteomyelitis by Dr. Sharol Given on 3/24.  Wound wound noted to appear infected. Labs significant for WBC 41 with lactic acid 2.8->2.4. -Admit to a medical telemetry bed -Follow-up blood cultures -Check CRP and ESR -Continue empiric antibiotics of vancomycin and Zosyn -Trend lactic acid levels -Check CBC in a.m. -Appreciate orthopedic consultative services, we will follow-up for further recommendation  Acute metabolic encephalopathy: Patient noted to be confused.  Patient able to move all extremities.  Suspect secondary to infection as seen above. -Neurochecks -Check CT scan of the brain  ESRD on HD: Patient normally dialyzes on M/W/F at the [facility] dialysis center.  On admission potassium noted to be 5.2, BUN 53, and creatinine 9.97. -Check renal function study -Continue midodrine on dialysis days -Nephrology consulted  Hyperkalemia: Acute.  Initial potassium noted to be 5.2 on admission.   -Recheck labs this a.m.  Peripheral vascular disease: Home medications include Plavix.  Patient  with history of prior amputations. -Held for need of surgery  History of CVA: Patient with prior history of CVA in 2000.  Nonischemic cardiomyopathy, moderate aortic stenosis: Appears to be compensated.  Last EF noted to be 55- 60% with impaired relaxation in 11/2018. -Strict intake and output -Daily weights   Hyperlipidemia: Home medications include atorvastatin 40 mg daily. -Continue atorvastatin  Thrombocytosis: Acute.  Platelet count 435.  Suspect this is reactive to acute infection. -Recheck CBC   DVT prophylaxis: Heparin Code Status: Full Family Communication: Attempted to contact son and daughter over the phone without response. Disposition Plan: TBD  Consults called: Orthopedics  Admission status: inpatient  Norval Morton MD [facility] Pager [phone]   If 7PM-7AM, please contact night-coverage [email or web address] Password [facility]  08/10/2019, 10:22 AM

## 2019-08-10 NOTE — Progress Notes (Addendum)
Pharmacy Antibiotic Note  [name]. is a 79 y.o. male admitted on 08/09/2019 with wound infection.  Pharmacy has been consulted for vancomycin/zosyn dosing. ESRD on HD MWF. S/p recent TMT amputation on left, per notes needs BKA at this point. Appears dialysis provider attempted to call Ortho to start antibiotics at HD but does not appear anything was started due to patient transfer to ED.  Patient received 1x doses of vancomycin 1g IV and Zosyn 3.375g (14min infusion) in the ED this morning.  Plan: Zosyn 2.25g IV q8h Vancomycin 500mg  IV x1 to complete 1500mg  IV loading dose; then 750mg  IV qHD F/u HD schedule/tolerance inpatient to enter vancomycin maintenance doses Monitor clinical progress, c/s, pre-HD vancomycin level as indicated F/u Surgery plans for BKA and antibiotic plan/LOT post-op       Temp (24hrs), Avg:97.8 F (36.6 C), Min:97.2 F (36.2 C), Max:98 F (36.7 C)  Recent Labs  Lab 08/09/19 1630 08/09/19 2108 08/10/19 0451 08/10/19 0822  WBC  --  41.0*  --   --   CREATININE 9.97*  --   --   --   LATICACIDVEN  --   --  2.8* 2.4*    Estimated Creatinine Clearance: 5.5 mL/min (A) (by C-G formula based on SCr of 9.97 mg/dL (H)).    Allergies  Allergen Reactions  . Aspirin Other (See Comments)    Causes internal bleeding  History of ulcers    Antimicrobials this admission: 4/13 vancomycin >>  4/13 zosyn >>   Dose adjustments this admission:   Microbiology results:   Arturo Morton, PharmD, BCPS Please check AMION for all [place] contact numbers Clinical Pharmacist 08/10/2019 10:56 AM

## 2019-08-10 NOTE — ED Notes (Signed)
IV team at bedside 

## 2019-08-10 NOTE — ED Notes (Signed)
Pt transferred to hospital bed for comfort.

## 2019-08-10 NOTE — Consult Note (Signed)
Reason for Consult:Foot infection Referring Physician: D Ival Bible Sr. is an 79 y.o. male.  HPI: [name] was sent to the ED from dialysis 2/2 AMS. 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 He was due to see Dr. Sharol Given in office this week. He describes some toothache like pain in his foot from time to time. He denies known fevers, chills, sweats, N/V.  Past Medical History:  Diagnosis Date  . Anxiety   . Aortic stenosis   . Arthritis    left hand  . CVA (cerebral infarction)   . ESRD (end stage renal disease) on dialysis [facility])    M/W/F at [facility] in [place]  . Essential hypertension    resolved with HD  . Gangrene of right foot ([place])   . Gastric ulcer 2004  . GI bleed    gastric ulcer  . Heart murmur   . History of blood transfusion   . History of cardiomyopathy    LVEF normal as of February 2017  . History of gastric ulcer   . History of stroke    Left side weakness  . Iron deficiency anemia   . Osteomyelitis ([place])   . Peripheral vascular disease ([place])   . Stroke [facility]) 2000   limp left    Past Surgical History:  Procedure Laterality Date  . ABDOMINAL AORTAGRAM N/A 01/24/2012   Procedure: ABDOMINAL Maxcine Ham;  Surgeon: Elam Dutch, MD;  Location: [facility] CATH LAB;  Service: Cardiovascular;  Laterality: N/A;  . ABDOMINAL AORTOGRAM W/LOWER EXTREMITY N/A 04/15/2017   Procedure: ABDOMINAL AORTOGRAM W/LOWER EXTREMITY;  Surgeon: Angelia Mould, MD;  Location: [place] CV LAB;  Service: Cardiovascular;  Laterality: N/A;  . AMPUTATION Right 05/09/2017   Procedure: RIGHT TRANSMETATARSAL AMPUTATION;  Surgeon: [name], MD;  Location: [place];  Service: Orthopedics;  Laterality: Right;  . AMPUTATION Right 06/13/2018   Procedure: AMPUTATION RIGHT LONG FINGER;  Surgeon: Dayna Barker, MD;  Location: [place];  Service: Plastics;  Laterality: Right;  . AMPUTATION Left 07/21/2019   Procedure: LEFT TRANSMETATARSAL  AMPUTATION;  Surgeon: [name], MD;  Location: [place];  Service: Orthopedics;  Laterality: Left;  . ARTERIOVENOUS GRAFT PLACEMENT Right right arm  . AV FISTULA PLACEMENT Left 08/31/2015   Procedure: ARTERIOVENOUS (AV) FISTULA CREATION- LEFT RADIOCEPHALIC;  Surgeon: Mal Misty, MD;  Location: [place];  Service: Vascular;  Laterality: Left;  . AV FISTULA PLACEMENT Right 02/18/2017   Procedure: INSERTION OF ARTERIOVENOUS (AV) GORE-TEX GRAFT  RIGHT UPPER ARM;  Surgeon: Conrad Cleburne, MD;  Location: [place];  Service: Vascular;  Laterality: Right;  . AV FISTULA PLACEMENT Left 12/01/2018   Procedure: INSERTION OF ARTERIOVENOUS (AV) GORE-TEX GRAFT ARM;  Surgeon: Angelia Mould, MD;  Location: [place];  Service: Vascular;  Laterality: Left;  . AV FISTULA PLACEMENT Left 02/09/2019   Procedure: INSERTION OF ARTERIOVENOUS (AV) GORE-TEX GRAFT LEFT THIGH;  Surgeon: Waynetta Sandy, MD;  Location: [place];  Service: Vascular;  Laterality: Left;  . BASCILIC VEIN TRANSPOSITION Left 12/19/2015   Procedure: FIRST STAGE BRACHIAL VEIN TRANSPOSITION;  Surgeon: Conrad Taneytown, MD;  Location: [place];  Service: Vascular;  Laterality: Left;  . BASCILIC VEIN TRANSPOSITION Left 02/08/2016   Procedure: SECOND STAGE BRACHIAL VEIN TRANSPOSITION;  Surgeon: Conrad Clayville, MD;  Location: [place];  Service: Vascular;  Laterality: Left;  . CARDIAC CATHETERIZATION N/A 07/11/2015   Procedure: Left Heart Cath  and Coronary Angiography;  Surgeon: Troy Sine, MD;  Location: [place] CV LAB;  Service: Cardiovascular;  Laterality: N/A;  . Carpel Tunnel Left Dec. 22, 2016  . CHOLECYSTECTOMY    . COLONOSCOPY  2004   Dr. Irving Shows, left sided diverticula and cecal polyp, path unknown  . COLONOSCOPY  10/29/2011   Procedure: COLONOSCOPY;  Surgeon: Daneil Dolin, MD;  [facility];  Service: Endoscopy;  Laterality: N/A;  10:15  . ESOPHAGOGASTRODUODENOSCOPY  11/2002   Dr. Gala Romney, erosive reflux esophagitis, multiple  gastric ulcer and antral/bulbar erosions. Serologies positive for H.Pylori and was treated  . ESOPHAGOGASTRODUODENOSCOPY  11/20014   Dr. Gala Romney, small hh only, ulcers healed  . ESOPHAGOGASTRODUODENOSCOPY  09/21/2011   Dr Trevor Iha [facility], antral erosions, ?early GAVE  . FISTULOGRAM Left 12/10/2016   Procedure: THROMBECTOMY OF LEFT ARM ARTERIOVENOUS FISTULA;  Surgeon: Waynetta Sandy, MD;  Location: [place];  Service: Vascular;  Laterality: Left;  . INSERTION OF DIALYSIS CATHETER Left 12/10/2016   Procedure: INSERTION OF TUNNELED DIALYSIS CATHETER;  Surgeon: Waynetta Sandy, MD;  Location: [place];  Service: Vascular;  Laterality: Left;  . INSERTION OF DIALYSIS CATHETER Right 06/09/2018   Procedure: INSERTION OF DIALYSIS CATHETER Right subclavian;  Surgeon: Angelia Mould, MD;  Location: [place];  Service: Vascular;  Laterality: Right;  . IR DIALY SHUNT INTRO [place] W/IMG RIGHT Right 01/01/2018  . IR GENERIC HISTORICAL  07/16/2016   IR REMOVAL TUN CV CATH W/O FL 07/16/2016 Saverio Danker, PA-C [facility]-INTERV RAD  . IR PTA ADDL CENTRAL DIALYSIS SEG THRU DIALY CIRCUIT RIGHT Right 10/21/2017  . IR REMOVAL TUN CV CATH W/O FL  05/12/2017  . IR THROMBECTOMY AV FISTULA W/THROMBOLYSIS/PTA INC/SHUNT/IMG RIGHT Right 10/21/2017  . IR THROMBECTOMY AV FISTULA W/THROMBOLYSIS/PTA INC/SHUNT/IMG RIGHT Right 11/27/2017  . IR US GUIDE VASC ACCESS RIGHT  10/21/2017  . IR US GUIDE VASC ACCESS RIGHT  11/27/2017  . IR US GUIDE VASC ACCESS RIGHT  01/01/2018  . LIGATION ARTERIOVENOUS GORTEX GRAFT Right 06/09/2018  . LIGATION ARTERIOVENOUS GORTEX GRAFT Right 06/09/2018   Procedure: LIGATION ARTERIOVENOUS GORTEX GRAFT RIGHT ARM;  Surgeon: Angelia Mould, MD;  Location: [place];  Service: Vascular;  Laterality: Right;  . LIGATION ARTERIOVENOUS GORTEX GRAFT Left 12/31/2018   Procedure: LIGATION OF LEFT UPPER ARM ARTERIVENOUS GORTEX GRAFT, LEFT BRACHIAL ARTERY ENDARTERECTOMY WITH BOVINE PATCH ANGIOPLASTY;   Surgeon: Waynetta Sandy, MD;  Location: [place];  Service: Vascular;  Laterality: Left;  . LIGATION OF ARTERIOVENOUS  FISTULA Left 12/19/2015   Procedure: LIGATION OF RADIOCEPHALIC ARTERIOVENOUS  FISTULA;  Surgeon: Conrad Hopewell, MD;  Location: [place];  Service: Vascular;  Laterality: Left;  Marland Kitchen MASS EXCISION Right 02/18/2017   Procedure: EXCISION OF RIGHT AXILLARY EPIDERMAL INCLUSION CYST;  Surgeon: Conrad Barnard, MD;  Location: [place];  Service: Vascular;  Laterality: Right;  . PERIPHERAL VASCULAR CATHETERIZATION N/A 12/14/2015   Procedure: Fistulagram;  Surgeon: Conrad Gordon, MD;  Location: [place] CV LAB;  Service: Cardiovascular;  Laterality: N/A;  . SHOULDER SURGERY Right    fracture  . ULTRASOUND GUIDANCE FOR VASCULAR ACCESS  04/15/2017   Procedure: Ultrasound Guidance For Vascular Access;  Surgeon: Angelia Mould, MD;  Location: [place] CV LAB;  Service: Cardiovascular;;  . UPPER EXTREMITY VENOGRAPHY Bilateral 12/17/2016   Procedure: Bilateral Upper Extremity Venography;  Surgeon: Serafina Mitchell, MD;  Location: [place] CV LAB;  Service: Cardiovascular;  Laterality: Bilateral;  . UPPER EXTREMITY VENOGRAPHY Left 11/03/2018  Procedure: UPPER EXTREMITY VENOGRAPHY;  Surgeon: Serafina Mitchell, MD;  Location: [place] CV LAB;  Service: Cardiovascular;  Laterality: Left;    Family History  Problem Relation Age of Onset  . Hypertension Mother   . Colon cancer Neg Hx   . Liver disease Neg Hx     Social History:  reports that he quit smoking about 15 years ago. He quit after 25.00 years of use. He quit smokeless tobacco use about 32 years ago.  His smokeless tobacco use included chew. He reports that he does not drink alcohol or use drugs.  Allergies:  Allergies  Allergen Reactions  . Aspirin Other (See Comments)    Causes internal bleeding  History of ulcers    Medications: I have reviewed the patient's current medications.  Results for orders placed or  performed during the hospital encounter of 08/09/19 (from the past 48 hour(s))  Basic metabolic panel     Status: Abnormal   Collection Time: 08/09/19  4:30 PM  Result Value Ref Range   Sodium 134 (L) 135 - 145 mmol/L   Potassium 5.2 (H) 3.5 - 5.1 mmol/L   Chloride 95 (L) 98 - 111 mmol/L   CO2 23 22 - 32 mmol/L   Glucose, Bld 93 70 - 99 mg/dL    Comment: Glucose reference range applies only to samples taken after fasting for at least 8 hours.   BUN 59 (H) 8 - 23 mg/dL   Creatinine, Ser 9.97 (H) 0.61 - 1.24 mg/dL   Calcium 9.7 8.9 - 10.3 mg/dL   GFR calc non Af Amer 5 (L) >60 mL/min   GFR calc Af Amer 5 (L) >60 mL/min   Anion gap 16 (H) 5 - 15    Comment: Performed at [facility] [address] [place], [place] [address]  CBC     Status: Abnormal   Collection Time: 08/09/19  9:08 PM  Result Value Ref Range   WBC 41.0 (H) 4.0 - 10.5 K/uL   RBC 5.77 4.22 - 5.81 MIL/uL   Hemoglobin 12.0 (L) 13.0 - 17.0 g/dL   HCT 36.0 (L) 39.0 - 52.0 %   MCV 62.4 (L) 80.0 - 100.0 fL   MCH 20.8 (L) 26.0 - 34.0 pg   MCHC 33.3 30.0 - 36.0 g/dL   RDW 18.0 (H) 11.5 - 15.5 %   Platelets 435 (H) 150 - 400 K/uL   nRBC 0.0 0.0 - 0.2 %    Comment: Performed at [facility] [address] [place], [place] [address]  Blood culture (routine x 2)     Status: None (Preliminary result)   Collection Time: 08/10/19  4:30 AM   Specimen: BLOOD LEFT ARM  Result Value Ref Range   Specimen Description BLOOD LEFT ARM    Special Requests      BOTTLES DRAWN AEROBIC AND ANAEROBIC Blood Culture adequate volume   Culture      NO GROWTH < 12 HOURS Performed at [facility], [place] [address] [place], [place] [address]    Report Status PENDING   Lactic acid, plasma     Status: Abnormal   Collection Time: 08/10/19  4:51 AM  Result Value Ref Range   Lactic Acid, Venous 2.8 (HH) 0.5 - 1.9 mmol/L    Comment: CRITICAL RESULT CALLED TO, READ BACK BY AND VERIFIED WITH: GROCE C,RN 08/10/19 0550  WAYK Performed at [facility], [place] [address] [place], [place] [address]  Blood culture (routine x 2)     Status: None (Preliminary result)   Collection Time: 08/10/19  4:55 AM   Specimen: BLOOD RIGHT ARM  Result Value Ref Range   Specimen Description BLOOD RIGHT ARM    Special Requests      BOTTLES DRAWN AEROBIC ONLY Blood Culture adequate volume   Culture      NO GROWTH < 12 HOURS Performed at [facility], [place] [address], [place], [place] [address]    Report Status PENDING   CBG monitoring, ED     Status: Abnormal   Collection Time: 08/10/19  6:52 AM  Result Value Ref Range   Glucose-Capillary 114 (H) 70 - 99 mg/dL    Comment: Glucose reference range applies only to samples taken after fasting for at least 8 hours.  Lactic acid, plasma     Status: Abnormal   Collection Time: 08/10/19  8:22 AM  Result Value Ref Range   Lactic Acid, Venous 2.4 (HH) 0.5 - 1.9 mmol/L    Comment: CRITICAL VALUE NOTED.  VALUE IS CONSISTENT WITH PREVIOUSLY REPORTED AND CALLED VALUE. Performed at [facility], [place] [address] [place], [place] [address]   Hepatic function panel     Status: Abnormal   Collection Time: 08/10/19  8:22 AM  Result Value Ref Range   Total Protein 7.9 6.5 - 8.1 g/dL   Albumin 2.1 (L) 3.5 - 5.0 g/dL   AST 68 (H) 15 - 41 U/L   ALT 21 0 - 44 U/L   Alkaline Phosphatase 72 38 - 126 U/L   Total Bilirubin 0.9 0.3 - 1.2 mg/dL   Bilirubin, Direct 0.2 0.0 - 0.2 mg/dL   Indirect Bilirubin 0.7 0.3 - 0.9 mg/dL    Comment: Performed at [facility] [address] [place], [place] [address]  Lipase, blood     Status: None   Collection Time: 08/10/19  8:22 AM  Result Value Ref Range   Lipase 20 11 - 51 U/L    Comment: Performed at [facility], [place] [address] [place], [place] [address]    DG Chest 2 View  Result Date: 08/10/2019 CLINICAL DATA:  Hiccups, post amputation LEFT foot, hypertension, end-stage renal disease EXAM: CHEST - 2 VIEW  COMPARISON:  06/30/2019 FINDINGS: Normal heart size, mediastinal contours, and pulmonary vascularity. Atherosclerotic calcification aorta. Mild chronic elevation of LEFT diaphragm with LEFT basilar atelectasis. Lungs otherwise clear. No acute infiltrate, pleural effusion or pneumothorax. BILATERAL chronic rotator cuff tears. Prior rodding of RIGHT humerus. IMPRESSION: Chronic LEFT basilar atelectasis. No acute abnormalities. Electronically Signed   By: Lavonia Dana M.D.   On: 08/10/2019 08:19   DG Foot Complete Left  Result Date: 08/10/2019 CLINICAL DATA:  Postop, pain, discharge, odor EXAM: LEFT FOOT - COMPLETE 3+ VIEW COMPARISON:  Portable exam 0746 hours compared to 06/30/2019 FINDINGS: Interval transmetatarsal amputation. Linear bone fragment identified at amputation bed of first metatarsal. Osseous demineralization. Scattered atherosclerotic calcifications. No fracture, dislocation, or definite bone destruction. IMPRESSION: Postoperative changes of transmetatarsal amputation LEFT foot as above. If there is persistent clinical concern for osteomyelitis or soft tissue infection, consider MR. Electronically Signed   By: Lavonia Dana M.D.   On: 08/10/2019 08:16    Review of Systems  Unable to perform ROS: Mental status change   Blood pressure 108/88, pulse (!) 103, temperature (!) 97.2 F (36.2 C), temperature source Oral, resp. rate (!) 21, SpO2 98 %. Physical Exam  Constitutional: He  appears well-developed and well-nourished. No distress.  HENT:  Head: Normocephalic and atraumatic.  Eyes: Conjunctivae are normal. Right eye exhibits no discharge. Left eye exhibits no discharge. No scleral icterus.  Cardiovascular: Normal rate and regular rhythm.  Respiratory: Effort normal. No respiratory distress.  Musculoskeletal:     Cervical back: Normal range of motion.     Comments: LLE No traumatic wounds, ecchymosis, or rash  S/p TMT amputation, sutures in place, scant discharge, no fluctuance, strong  foul odor, copious epidermal necrolysis  No knee or ankle effusion  Knee stable to varus/ valgus and anterior/posterior stress  Sens DPN, SPN, TN absent  Neurological: He is alert.  Skin: Skin is warm and dry. He is not diaphoretic.  Psychiatric: He has a normal mood and affect. His behavior is normal.    Assessment/Plan: S/p left TMT amputation with wound infection -- Will need BKA at this point. Dr. Sharol Given to evaluate later today, surgery likely tomorrow. Multiple medical problems including AS, s/p CVA, ESRD on HD, and PVD -- Will ask medicine to admit and clear for surgery    Lisette Abu, PA-C Orthopedic Surgery [phone] 08/10/2019, 9:27 AM

## 2019-08-10 NOTE — Progress Notes (Signed)
Received pt from ED. VSS. Orientation hard to assess, speech incomprehensible. Does follow commands. Denies pain. CHG bath completed, tele applied and verified x2. Redressed foot w/ wet-to-dry dressing, scant foul-smelling drainage. Pt tolerated dressing change well w/ minimal pain.  Oriented to room and call light, will continue to monitor.

## 2019-08-10 NOTE — Consult Note (Signed)
Reason for Consult: Continuity of ESRD care Referring Physician: Fuller Plan, MD [facility])  HPI:  79 year old African-American man with past medical history significant for hypertension, history of CVA, history of aortic stenosis (nonischemic cardiomyopathy (EF 55-65%), peripheral vascular disease status post recent transmetatarsal amputation for osteomyelitis and end-stage renal disease on hemodialysis on a TTS schedule at the [facility] unit in [place], [place].  He was brought into the emergency room today after noticed to be confused with a nonhealing wound over the recent surgical site.  Recommendations were made for initiation of antibiotics as an outpatient however, patient brought to the emergency room prior to this being instituted.  Because of his delirium, he is not clearly able to offer additional history and perseverates on wanting a plate of food to eat.  Dialysis prescription: Monday/Wednesday/Friday, 4 hours, BFR 400/DFR 800, 2K/2.5 calcium, heparin 6000 units, left femoral loop graft, EDW 66 kg.  [name] mcg 3 times weekly, Sensipar 90 mg daily.  Past Medical History:  Diagnosis Date  . Anxiety   . Aortic stenosis   . Arthritis    left hand  . CVA (cerebral infarction)   . ESRD (end stage renal disease) on dialysis [facility])    M/W/F at [facility] [place]  . Essential hypertension    resolved with HD  . Gangrene of right foot ([place])   . Gastric ulcer 2004  . GI bleed    gastric ulcer  . Heart murmur   . History of blood transfusion   . History of cardiomyopathy    LVEF normal as of February 2017  . History of gastric ulcer   . History of stroke    Left side weakness  . Iron deficiency anemia   . Osteomyelitis ([place])   . Peripheral vascular disease ([place])   . Stroke [facility]) 2000   limp left    Past Surgical History:  Procedure Laterality Date  . ABDOMINAL AORTAGRAM N/A 01/24/2012   Procedure: ABDOMINAL Maxcine Ham;  Surgeon: Elam Dutch, MD;  Location: [facility] CATH  LAB;  Service: Cardiovascular;  Laterality: N/A;  . ABDOMINAL AORTOGRAM W/LOWER EXTREMITY N/A 04/15/2017   Procedure: ABDOMINAL AORTOGRAM W/LOWER EXTREMITY;  Surgeon: Angelia Mould, MD;  Location: [place] CV LAB;  Service: Cardiovascular;  Laterality: N/A;  . AMPUTATION Right 05/09/2017   Procedure: RIGHT TRANSMETATARSAL AMPUTATION;  Surgeon: [name], MD;  Location: [place];  Service: Orthopedics;  Laterality: Right;  . AMPUTATION Right 06/13/2018   Procedure: AMPUTATION RIGHT LONG FINGER;  Surgeon: Dayna Barker, MD;  Location: [place];  Service: Plastics;  Laterality: Right;  . AMPUTATION Left 07/21/2019   Procedure: LEFT TRANSMETATARSAL AMPUTATION;  Surgeon: [name], MD;  Location: [place];  Service: Orthopedics;  Laterality: Left;  . ARTERIOVENOUS GRAFT PLACEMENT Right right arm  . AV FISTULA PLACEMENT Left 08/31/2015   Procedure: ARTERIOVENOUS (AV) FISTULA CREATION- LEFT RADIOCEPHALIC;  Surgeon: Mal Misty, MD;  Location: [place];  Service: Vascular;  Laterality: Left;  . AV FISTULA PLACEMENT Right 02/18/2017   Procedure: INSERTION OF ARTERIOVENOUS (AV) GORE-TEX GRAFT  RIGHT UPPER ARM;  Surgeon: Conrad [place], MD;  Location: [place];  Service: Vascular;  Laterality: Right;  . AV FISTULA PLACEMENT Left 12/01/2018   Procedure: INSERTION OF ARTERIOVENOUS (AV) GORE-TEX GRAFT ARM;  Surgeon: Angelia Mould, MD;  Location: [place];  Service: Vascular;  Laterality: Left;  . AV FISTULA PLACEMENT Left 02/09/2019   Procedure: INSERTION OF ARTERIOVENOUS (AV) GORE-TEX GRAFT LEFT THIGH;  Surgeon: Donzetta Matters,  Georgia Dom, MD;  Location: [place];  Service: Vascular;  Laterality: Left;  . BASCILIC VEIN TRANSPOSITION Left 12/19/2015   Procedure: FIRST STAGE BRACHIAL VEIN TRANSPOSITION;  Surgeon: Conrad [place], MD;  Location: [place];  Service: Vascular;  Laterality: Left;  . BASCILIC VEIN TRANSPOSITION Left 02/08/2016   Procedure: SECOND STAGE BRACHIAL VEIN TRANSPOSITION;  Surgeon: Conrad [place], MD;  Location: [place];  Service: Vascular;  Laterality: Left;  . CARDIAC CATHETERIZATION N/A 07/11/2015   Procedure: Left Heart Cath and Coronary Angiography;  Surgeon: Troy Sine, MD;  Location: [place] CV LAB;  Service: Cardiovascular;  Laterality: N/A;  . Carpel Tunnel Left Dec. 22, 2016  . CHOLECYSTECTOMY    . COLONOSCOPY  2004   Dr. Irving Shows, left sided diverticula and cecal polyp, path unknown  . COLONOSCOPY  10/29/2011   Procedure: COLONOSCOPY;  Surgeon: Daneil Dolin, MD;  [facility];  Service: Endoscopy;  Laterality: N/A;  10:15  . ESOPHAGOGASTRODUODENOSCOPY  11/2002   Dr. Gala Romney, erosive reflux esophagitis, multiple gastric ulcer and antral/bulbar erosions. Serologies positive for H.Pylori and was treated  . ESOPHAGOGASTRODUODENOSCOPY  11/20014   Dr. Gala Romney, small hh only, ulcers healed  . ESOPHAGOGASTRODUODENOSCOPY  09/21/2011   Dr Trevor Iha [facility], antral erosions, ?early GAVE  . FISTULOGRAM Left 12/10/2016   Procedure: THROMBECTOMY OF LEFT ARM ARTERIOVENOUS FISTULA;  Surgeon: Waynetta Sandy, MD;  Location: [place];  Service: Vascular;  Laterality: Left;  . INSERTION OF DIALYSIS CATHETER Left 12/10/2016   Procedure: INSERTION OF TUNNELED DIALYSIS CATHETER;  Surgeon: Waynetta Sandy, MD;  Location: [place];  Service: Vascular;  Laterality: Left;  . INSERTION OF DIALYSIS CATHETER Right 06/09/2018   Procedure: INSERTION OF DIALYSIS CATHETER Right subclavian;  Surgeon: Angelia Mould, MD;  Location: [place];  Service: Vascular;  Laterality: Right;  . IR DIALY SHUNT INTRO [place] W/IMG RIGHT Right 01/01/2018  . IR GENERIC HISTORICAL  07/16/2016   IR REMOVAL TUN CV CATH W/O FL 07/16/2016 Saverio Danker, PA-C [facility]-INTERV RAD  . IR PTA ADDL CENTRAL DIALYSIS SEG THRU DIALY CIRCUIT RIGHT Right 10/21/2017  . IR REMOVAL TUN CV CATH W/O FL  05/12/2017  . IR THROMBECTOMY AV FISTULA W/THROMBOLYSIS/PTA INC/SHUNT/IMG RIGHT Right 10/21/2017  . IR  THROMBECTOMY AV FISTULA W/THROMBOLYSIS/PTA INC/SHUNT/IMG RIGHT Right 11/27/2017  . IR US GUIDE VASC ACCESS RIGHT  10/21/2017  . IR US GUIDE VASC ACCESS RIGHT  11/27/2017  . IR US GUIDE VASC ACCESS RIGHT  01/01/2018  . LIGATION ARTERIOVENOUS GORTEX GRAFT Right 06/09/2018  . LIGATION ARTERIOVENOUS GORTEX GRAFT Right 06/09/2018   Procedure: LIGATION ARTERIOVENOUS GORTEX GRAFT RIGHT ARM;  Surgeon: Angelia Mould, MD;  Location: [place];  Service: Vascular;  Laterality: Right;  . LIGATION ARTERIOVENOUS GORTEX GRAFT Left 12/31/2018   Procedure: LIGATION OF LEFT UPPER ARM ARTERIVENOUS GORTEX GRAFT, LEFT BRACHIAL ARTERY ENDARTERECTOMY WITH BOVINE PATCH ANGIOPLASTY;  Surgeon: Waynetta Sandy, MD;  Location: [place];  Service: Vascular;  Laterality: Left;  . LIGATION OF ARTERIOVENOUS  FISTULA Left 12/19/2015   Procedure: LIGATION OF RADIOCEPHALIC ARTERIOVENOUS  FISTULA;  Surgeon: Conrad [place], MD;  Location: [place];  Service: Vascular;  Laterality: Left;  Marland Kitchen MASS EXCISION Right 02/18/2017   Procedure: EXCISION OF RIGHT AXILLARY EPIDERMAL INCLUSION CYST;  Surgeon: Conrad [place], MD;  Location: [place];  Service: Vascular;  Laterality: Right;  . PERIPHERAL VASCULAR CATHETERIZATION N/A 12/14/2015   Procedure: Fistulagram;  Surgeon: Conrad , MD;  Location: [place] CV  LAB;  Service: Cardiovascular;  Laterality: N/A;  . SHOULDER SURGERY Right    fracture  . ULTRASOUND GUIDANCE FOR VASCULAR ACCESS  04/15/2017   Procedure: Ultrasound Guidance For Vascular Access;  Surgeon: Angelia Mould, MD;  Location: [place] CV LAB;  Service: Cardiovascular;;  . UPPER EXTREMITY VENOGRAPHY Bilateral 12/17/2016   Procedure: Bilateral Upper Extremity Venography;  Surgeon: Serafina Mitchell, MD;  Location: [place] CV LAB;  Service: Cardiovascular;  Laterality: Bilateral;  . UPPER EXTREMITY VENOGRAPHY Left 11/03/2018   Procedure: UPPER EXTREMITY VENOGRAPHY;  Surgeon: Serafina Mitchell, MD;  Location: [place]  CV LAB;  Service: Cardiovascular;  Laterality: Left;    Family History  Problem Relation Age of Onset  . Hypertension Mother   . Colon cancer Neg Hx   . Liver disease Neg Hx     Social History:  reports that he quit smoking about 15 years ago. He quit after 25.00 years of use. He quit smokeless tobacco use about 32 years ago.  His smokeless tobacco use included chew. He reports that he does not drink alcohol or use drugs.  Allergies:  Allergies  Allergen Reactions  . Aspirin Other (See Comments)    Causes internal bleeding  History of ulcers    Medications:  Scheduled: . atorvastatin  40 mg Oral Daily  . cinacalcet  60 mg Oral QPM  . heparin  5,000 Units Subcutaneous Q8H  . [START ON 08/11/2019] midodrine  10 mg Oral Q M,W,F  . multivitamin  1 tablet Oral QHS  . sevelamer carbonate  3,200 mg Oral TID WC   And  . sevelamer carbonate  1,600 mg Oral With snacks  . [START ON 08/11/2019] vancomycin variable dose per unstable renal function (pharmacist dosing)   Does not apply See admin instructions    BMP Latest Ref Rng & Units 08/10/2019 08/09/2019 07/21/2019  Glucose 70 - 99 mg/dL 113(H) 93 109(H)  BUN 8 - 23 mg/dL 69(H) 59(H) 28(H)  Creatinine 0.61 - 1.24 mg/dL 10.75(H) 9.97(H) 8.85(H)  Sodium 135 - 145 mmol/L 133(L) 134(L) 137  Potassium 3.5 - 5.1 mmol/L 4.9 5.2(H) 4.4  Chloride 98 - 111 mmol/L 93(L) 95(L) 96(L)  CO2 22 - 32 mmol/L 25 23 29   Calcium 8.9 - 10.3 mg/dL 9.7 9.7 9.7   CBC Latest Ref Rng & Units 08/09/2019 07/21/2019 07/21/2019  WBC 4.0 - 10.5 K/uL 41.0(H) 14.2(H) -  Hemoglobin 13.0 - 17.0 g/dL 12.0(L) 12.3(L) 15.3  Hematocrit 39.0 - 52.0 % 36.0(L) 38.7(L) 45.0  Platelets 150 - 400 K/uL 435(H) 382 -     DG Chest 2 View  Result Date: 08/10/2019 CLINICAL DATA:  Hiccups, post amputation LEFT foot, hypertension, end-stage renal disease EXAM: CHEST - 2 VIEW COMPARISON:  06/30/2019 FINDINGS: Normal heart size, mediastinal contours, and pulmonary vascularity.  Atherosclerotic calcification aorta. Mild chronic elevation of LEFT diaphragm with LEFT basilar atelectasis. Lungs otherwise clear. No acute infiltrate, pleural effusion or pneumothorax. BILATERAL chronic rotator cuff tears. Prior rodding of RIGHT humerus. IMPRESSION: Chronic LEFT basilar atelectasis. No acute abnormalities. Electronically Signed   By: Lavonia Dana M.D.   On: 08/10/2019 08:19   DG Foot Complete Left  Result Date: 08/10/2019 CLINICAL DATA:  Postop, pain, discharge, odor EXAM: LEFT FOOT - COMPLETE 3+ VIEW COMPARISON:  Portable exam 0746 hours compared to 06/30/2019 FINDINGS: Interval transmetatarsal amputation. Linear bone fragment identified at amputation bed of first metatarsal. Osseous demineralization. Scattered atherosclerotic calcifications. No fracture, dislocation, or definite bone destruction. IMPRESSION: Postoperative changes of transmetatarsal  amputation LEFT foot as above. If there is persistent clinical concern for osteomyelitis or soft tissue infection, consider MR. Electronically Signed   By: Lavonia Dana M.D.   On: 08/10/2019 08:16    Review of Systems  Unable to perform ROS: Mental status change (Acute illness delirium)   Blood pressure (!) 110/58, pulse 96, temperature (!) 97.2 F (36.2 C), temperature source Oral, resp. rate 20, SpO2 98 %. Physical Exam  Nursing note and vitals reviewed. Constitutional: He appears well-developed and well-nourished. No distress.  HENT:  Head: Normocephalic and atraumatic.  Mouth/Throat: Oropharynx is clear and moist.  Eyes: Pupils are equal, round, and reactive to light. Conjunctivae are normal. No scleral icterus.  Neck: No JVD present.  Cardiovascular: Normal rate, regular rhythm and normal heart sounds.  Respiratory: Effort normal and breath sounds normal. He has no wheezes. He has no rales.  GI: Soft. Bowel sounds are normal. There is no abdominal tenderness. There is no rebound and no guarding.  Musculoskeletal:         General: No edema.     Cervical back: Normal range of motion and neck supple.     Comments: Left thigh loop graft.  Left TMA wound with intact dressing.  Neurological: He is alert.  Not oriented to time person or place.  Skin: Skin is warm and dry. No rash noted.  : Uncertain  Assessment/Plan: 1.  Altered mental status/leukocytosis: Consistent with acute illness delirium likely from infected/nonhealing ulcer of the left TMA.  On broad-spectrum antibiotic coverage with cultures pending and additional evaluation for source identification of infection per primary service. 2.  End-stage renal disease: Will resume hemodialysis on his usual outpatient Monday/Wednesday/Friday schedule.  His electrolytes are within acceptable range and he does not have any acute indications for dialysis today.  He is euvolemic to possibly hypovolemic on exam. 3.  Peripheral vascular disease status post left TMA: Likely needs reevaluation for possible below-knee amputation based on the nonhealing nature of his leg wound. 4.  Hypertension: Blood pressures currently at goal, monitor for possible hypotension with ongoing infection. 5.  Secondary hyperparathyroidism: Resume phosphorus binders along with vitamin D receptor analog and Sensipar.  Continue to follow calcium and phosphorus levels. 6.  Anemia of chronic illness: Hemoglobin and hematocrit currently at goal, will continue to follow trajectory for need to restart ESA versus iron.  Madisyn Mawhinney K. 08/10/2019, 1:42 PM

## 2019-08-11 ENCOUNTER — Other Ambulatory Visit: Payer: Self-pay | Admitting: Physician Assistant

## 2019-08-11 DIAGNOSIS — R7989 Other specified abnormal findings of blood chemistry: Secondary | ICD-10-CM

## 2019-08-11 DIAGNOSIS — R011 Cardiac murmur, unspecified: Secondary | ICD-10-CM

## 2019-08-11 DIAGNOSIS — B952 Enterococcus as the cause of diseases classified elsewhere: Secondary | ICD-10-CM

## 2019-08-11 DIAGNOSIS — I96 Gangrene, not elsewhere classified: Secondary | ICD-10-CM | POA: Diagnosis not present

## 2019-08-11 DIAGNOSIS — Z87891 Personal history of nicotine dependence: Secondary | ICD-10-CM

## 2019-08-11 DIAGNOSIS — L089 Local infection of the skin and subcutaneous tissue, unspecified: Principal | ICD-10-CM

## 2019-08-11 DIAGNOSIS — R5381 Other malaise: Secondary | ICD-10-CM

## 2019-08-11 DIAGNOSIS — Z89422 Acquired absence of other left toe(s): Secondary | ICD-10-CM

## 2019-08-11 DIAGNOSIS — Z992 Dependence on renal dialysis: Secondary | ICD-10-CM

## 2019-08-11 DIAGNOSIS — R7881 Bacteremia: Secondary | ICD-10-CM

## 2019-08-11 DIAGNOSIS — Z886 Allergy status to analgesic agent status: Secondary | ICD-10-CM

## 2019-08-11 DIAGNOSIS — I120 Hypertensive chronic kidney disease with stage 5 chronic kidney disease or end stage renal disease: Secondary | ICD-10-CM | POA: Diagnosis not present

## 2019-08-11 DIAGNOSIS — E871 Hypo-osmolality and hyponatremia: Secondary | ICD-10-CM

## 2019-08-11 DIAGNOSIS — I69354 Hemiplegia and hemiparesis following cerebral infarction affecting left non-dominant side: Secondary | ICD-10-CM

## 2019-08-11 DIAGNOSIS — N186 End stage renal disease: Secondary | ICD-10-CM

## 2019-08-11 DIAGNOSIS — E872 Acidosis: Secondary | ICD-10-CM

## 2019-08-11 LAB — RENAL FUNCTION PANEL
Albumin: 1.9 g/dL — ABNORMAL LOW (ref 3.5–5.0)
Anion gap: 17 — ABNORMAL HIGH (ref 5–15)
BUN: 77 mg/dL — ABNORMAL HIGH (ref 8–23)
CO2: 22 mmol/L (ref 22–32)
Calcium: 9.9 mg/dL (ref 8.9–10.3)
Chloride: 92 mmol/L — ABNORMAL LOW (ref 98–111)
Creatinine, Ser: 11.38 mg/dL — ABNORMAL HIGH (ref 0.61–1.24)
GFR calc Af Amer: 4 mL/min — ABNORMAL LOW
GFR calc non Af Amer: 4 mL/min — ABNORMAL LOW
Glucose, Bld: 70 mg/dL (ref 70–99)
Phosphorus: 2.5 mg/dL (ref 2.5–4.6)
Potassium: 5.3 mmol/L — ABNORMAL HIGH (ref 3.5–5.1)
Sodium: 131 mmol/L — ABNORMAL LOW (ref 135–145)

## 2019-08-11 LAB — CBC
HCT: 38 % — ABNORMAL LOW (ref 39.0–52.0)
HCT: 34.2 % — ABNORMAL LOW (ref 39.0–52.0)
Hemoglobin: 12.5 g/dL — ABNORMAL LOW (ref 13.0–17.0)
Hemoglobin: 11.5 g/dL — ABNORMAL LOW (ref 13.0–17.0)
MCH: 20.4 pg — ABNORMAL LOW (ref 26.0–34.0)
MCH: 20.4 pg — ABNORMAL LOW (ref 26.0–34.0)
MCHC: 32.9 g/dL (ref 30.0–36.0)
MCHC: 33.6 g/dL (ref 30.0–36.0)
MCV: 61.9 fL — ABNORMAL LOW (ref 80.0–100.0)
MCV: 60.7 fL — ABNORMAL LOW (ref 80.0–100.0)
Platelets: 380 10*3/uL (ref 150–400)
Platelets: 362 10*3/uL (ref 150–400)
RBC: 6.14 MIL/uL — ABNORMAL HIGH (ref 4.22–5.81)
RBC: 5.63 MIL/uL (ref 4.22–5.81)
RDW: 18.7 % — ABNORMAL HIGH (ref 11.5–15.5)
RDW: 18.7 % — ABNORMAL HIGH (ref 11.5–15.5)
WBC: 38.6 10*3/uL — ABNORMAL HIGH (ref 4.0–10.5)
WBC: 38.1 10*3/uL — ABNORMAL HIGH (ref 4.0–10.5)
nRBC: 0 % (ref 0.0–0.2)
nRBC: 0 % (ref 0.0–0.2)

## 2019-08-11 LAB — BLOOD CULTURE ID PANEL (REFLEXED)

## 2019-08-11 LAB — BASIC METABOLIC PANEL
Anion gap: 20 — ABNORMAL HIGH (ref 5–15)
Anion gap: 13 (ref 5–15)
BUN: 81 mg/dL — ABNORMAL HIGH (ref 8–23)
BUN: 27 mg/dL — ABNORMAL HIGH (ref 8–23)
CO2: 20 mmol/L — ABNORMAL LOW (ref 22–32)
CO2: 27 mmol/L (ref 22–32)
Calcium: 9.8 mg/dL (ref 8.9–10.3)
Calcium: 9.1 mg/dL (ref 8.9–10.3)
Chloride: 92 mmol/L — ABNORMAL LOW (ref 98–111)
Chloride: 96 mmol/L — ABNORMAL LOW (ref 98–111)
Creatinine, Ser: 11.57 mg/dL — ABNORMAL HIGH (ref 0.61–1.24)
Creatinine, Ser: 5.43 mg/dL — ABNORMAL HIGH (ref 0.61–1.24)
GFR calc Af Amer: 4 mL/min — ABNORMAL LOW (ref >60)
GFR calc Af Amer: 11 mL/min — ABNORMAL LOW (ref >60)
GFR calc non Af Amer: 4 mL/min — ABNORMAL LOW (ref >60)
GFR calc non Af Amer: 9 mL/min — ABNORMAL LOW (ref >60)
Glucose, Bld: 70 mg/dL (ref 70–99)
Glucose, Bld: 97 mg/dL (ref 70–99)
Potassium: 5.5 mmol/L — ABNORMAL HIGH (ref 3.5–5.1)
Potassium: 3.7 mmol/L (ref 3.5–5.1)
Sodium: 132 mmol/L — ABNORMAL LOW (ref 135–145)
Sodium: 136 mmol/L (ref 135–145)

## 2019-08-11 LAB — MAGNESIUM: Magnesium: 1.9 mg/dL (ref 1.7–2.4)

## 2019-08-11 LAB — TSH: TSH: 1.708 u[IU]/mL (ref 0.350–4.500)

## 2019-08-11 LAB — HEPATITIS B SURFACE ANTIGEN: Hepatitis B Surface Ag: NONREACTIVE

## 2019-08-11 MED ORDER — CEFAZOLIN SODIUM-DEXTROSE 2-4 GM/100ML-% IV SOLN: 2.0000 g | INTRAVENOUS | Status: AC

## 2019-08-11 MED ORDER — METOPROLOL TARTRATE 5 MG/5ML IV SOLN
5.0000 mg | Freq: Once | INTRAVENOUS | Status: AC
  Administered 2019-08-11: 5 mg via INTRAVENOUS
  Filled 2019-08-11: qty 5

## 2019-08-11 MED ORDER — SODIUM CHLORIDE 0.9 % IV SOLN
2.0000 g | Freq: Two times a day (BID) | INTRAVENOUS | Status: DC
  Filled 2019-08-11 (×2): qty 2000

## 2019-08-11 MED ORDER — VANCOMYCIN HCL IN DEXTROSE 750-5 MG/150ML-% IV SOLN
750.0000 mg | INTRAVENOUS | Status: DC
  Filled 2019-08-11 (×3): qty 150

## 2019-08-11 MED ORDER — VANCOMYCIN HCL 1250 MG/250ML IV SOLN
1250.0000 mg | Freq: Once | INTRAVENOUS | Status: DC
  Filled 2019-08-11: qty 250

## 2019-08-11 MED ORDER — MUPIROCIN 2 % EX OINT
1.0000 "application " | TOPICAL_OINTMENT | Freq: Two times a day (BID) | CUTANEOUS | Status: AC
  Administered 2019-08-11 – 2019-08-15 (×9): 1 via NASAL
  Filled 2019-08-11 (×2): qty 22

## 2019-08-11 MED ORDER — VANCOMYCIN HCL IN DEXTROSE 750-5 MG/150ML-% IV SOLN
INTRAVENOUS | Status: AC
  Administered 2019-08-11: 750 mg via INTRAVENOUS
  Filled 2019-08-11: qty 150

## 2019-08-11 NOTE — Consult Note (Signed)
ORTHOPAEDIC CONSULTATION  REQUESTING PHYSICIAN: Mercy Riding, MD  Chief Complaint: Cold gangrenous changes left transmetatarsal amputation.  HPI: [name]. is a 79 y.o. male who presents with dry gangrene left foot.  Patient is status post transmetatarsal amputation the wound edges are well approximated however patient's foot has developed dry gangrenous changes circumferentially around the hindfoot and midfoot.  Past Medical History:  Diagnosis Date  . Anxiety   . Aortic stenosis   . Arthritis    left hand  . CVA (cerebral infarction)   . ESRD (end stage renal disease) on dialysis [facility])    M/W/F at [facility] in [place]  . Essential hypertension    resolved with HD  . Gangrene of right foot ([place])   . Gastric ulcer 2004  . GI bleed    gastric ulcer  . Heart murmur   . History of blood transfusion   . History of cardiomyopathy    LVEF normal as of February 2017  . History of gastric ulcer   . History of stroke    Left side weakness  . Iron deficiency anemia   . Osteomyelitis ([place])   . Peripheral vascular disease ([place])   . Stroke [facility]) 2000   limp left   Past Surgical History:  Procedure Laterality Date  . ABDOMINAL AORTAGRAM N/A 01/24/2012   Procedure: ABDOMINAL Maxcine Ham;  Surgeon: Elam Dutch, MD;  Location: [facility] CATH LAB;  Service: Cardiovascular;  Laterality: N/A;  . ABDOMINAL AORTOGRAM W/LOWER EXTREMITY N/A 04/15/2017   Procedure: ABDOMINAL AORTOGRAM W/LOWER EXTREMITY;  Surgeon: Angelia Mould, MD;  Location: [place] CV LAB;  Service: Cardiovascular;  Laterality: N/A;  . AMPUTATION Right 05/09/2017   Procedure: RIGHT TRANSMETATARSAL AMPUTATION;  Surgeon: [name], MD;  Location: [place];  Service: Orthopedics;  Laterality: Right;  . AMPUTATION Right 06/13/2018   Procedure: AMPUTATION RIGHT LONG FINGER;  Surgeon: Dayna Barker, MD;  Location: [place];  Service: Plastics;  Laterality: Right;  . AMPUTATION Left 07/21/2019   Procedure: LEFT  TRANSMETATARSAL AMPUTATION;  Surgeon: [name], MD;  Location: [place];  Service: Orthopedics;  Laterality: Left;  . ARTERIOVENOUS GRAFT PLACEMENT Right right arm  . AV FISTULA PLACEMENT Left 08/31/2015   Procedure: ARTERIOVENOUS (AV) FISTULA CREATION- LEFT RADIOCEPHALIC;  Surgeon: Mal Misty, MD;  Location: [place];  Service: Vascular;  Laterality: Left;  . AV FISTULA PLACEMENT Right 02/18/2017   Procedure: INSERTION OF ARTERIOVENOUS (AV) GORE-TEX GRAFT  RIGHT UPPER ARM;  Surgeon: Conrad Raceland, MD;  Location: [place];  Service: Vascular;  Laterality: Right;  . AV FISTULA PLACEMENT Left 12/01/2018   Procedure: INSERTION OF ARTERIOVENOUS (AV) GORE-TEX GRAFT ARM;  Surgeon: Angelia Mould, MD;  Location: [place];  Service: Vascular;  Laterality: Left;  . AV FISTULA PLACEMENT Left 02/09/2019   Procedure: INSERTION OF ARTERIOVENOUS (AV) GORE-TEX GRAFT LEFT THIGH;  Surgeon: Waynetta Sandy, MD;  Location: [place];  Service: Vascular;  Laterality: Left;  . BASCILIC VEIN TRANSPOSITION Left 12/19/2015   Procedure: FIRST STAGE BRACHIAL VEIN TRANSPOSITION;  Surgeon: Conrad [place], MD;  Location: [place];  Service: Vascular;  Laterality: Left;  . BASCILIC VEIN TRANSPOSITION Left 02/08/2016   Procedure: SECOND STAGE BRACHIAL VEIN TRANSPOSITION;  Surgeon: Conrad Highlands, MD;  Location: [place];  Service: Vascular;  Laterality: Left;  . CARDIAC CATHETERIZATION N/A 07/11/2015   Procedure: Left Heart Cath and Coronary Angiography;  Surgeon: Troy Sine, MD;  Location: [place] CV LAB;  Service: Cardiovascular;  Laterality: N/A;  . Carpel Tunnel Left Dec. 22, 2016  . CHOLECYSTECTOMY    . COLONOSCOPY  2004   Dr. Irving Shows, left sided diverticula and cecal polyp, path unknown  . COLONOSCOPY  10/29/2011   Procedure: COLONOSCOPY;  Surgeon: Daneil Dolin, MD;  [facility];  Service: Endoscopy;  Laterality: N/A;  10:15  . ESOPHAGOGASTRODUODENOSCOPY  11/2002   Dr. Gala Romney, erosive reflux  esophagitis, multiple gastric ulcer and antral/bulbar erosions. Serologies positive for H.Pylori and was treated  . ESOPHAGOGASTRODUODENOSCOPY  11/20014   Dr. Gala Romney, small hh only, ulcers healed  . ESOPHAGOGASTRODUODENOSCOPY  09/21/2011   Dr Trevor Iha [facility], antral erosions, ?early GAVE  . FISTULOGRAM Left 12/10/2016   Procedure: THROMBECTOMY OF LEFT ARM ARTERIOVENOUS FISTULA;  Surgeon: Waynetta Sandy, MD;  Location: [place];  Service: Vascular;  Laterality: Left;  . INSERTION OF DIALYSIS CATHETER Left 12/10/2016   Procedure: INSERTION OF TUNNELED DIALYSIS CATHETER;  Surgeon: Waynetta Sandy, MD;  Location: [place];  Service: Vascular;  Laterality: Left;  . INSERTION OF DIALYSIS CATHETER Right 06/09/2018   Procedure: INSERTION OF DIALYSIS CATHETER Right subclavian;  Surgeon: Angelia Mould, MD;  Location: [place];  Service: Vascular;  Laterality: Right;  . IR DIALY SHUNT INTRO [place] W/IMG RIGHT Right 01/01/2018  . IR GENERIC HISTORICAL  07/16/2016   IR REMOVAL TUN CV CATH W/O FL 07/16/2016 Saverio Danker, PA-C [facility]-INTERV RAD  . IR PTA ADDL CENTRAL DIALYSIS SEG THRU DIALY CIRCUIT RIGHT Right 10/21/2017  . IR REMOVAL TUN CV CATH W/O FL  05/12/2017  . IR THROMBECTOMY AV FISTULA W/THROMBOLYSIS/PTA INC/SHUNT/IMG RIGHT Right 10/21/2017  . IR THROMBECTOMY AV FISTULA W/THROMBOLYSIS/PTA INC/SHUNT/IMG RIGHT Right 11/27/2017  . IR US GUIDE VASC ACCESS RIGHT  10/21/2017  . IR US GUIDE VASC ACCESS RIGHT  11/27/2017  . IR US GUIDE VASC ACCESS RIGHT  01/01/2018  . LIGATION ARTERIOVENOUS GORTEX GRAFT Right 06/09/2018  . LIGATION ARTERIOVENOUS GORTEX GRAFT Right 06/09/2018   Procedure: LIGATION ARTERIOVENOUS GORTEX GRAFT RIGHT ARM;  Surgeon: Angelia Mould, MD;  Location: [place];  Service: Vascular;  Laterality: Right;  . LIGATION ARTERIOVENOUS GORTEX GRAFT Left 12/31/2018   Procedure: LIGATION OF LEFT UPPER ARM ARTERIVENOUS GORTEX GRAFT, LEFT BRACHIAL ARTERY ENDARTERECTOMY WITH  BOVINE PATCH ANGIOPLASTY;  Surgeon: Waynetta Sandy, MD;  Location: [place];  Service: Vascular;  Laterality: Left;  . LIGATION OF ARTERIOVENOUS  FISTULA Left 12/19/2015   Procedure: LIGATION OF RADIOCEPHALIC ARTERIOVENOUS  FISTULA;  Surgeon: Conrad [place], MD;  Location: [place];  Service: Vascular;  Laterality: Left;  Marland Kitchen MASS EXCISION Right 02/18/2017   Procedure: EXCISION OF RIGHT AXILLARY EPIDERMAL INCLUSION CYST;  Surgeon: Conrad [place], MD;  Location: [place];  Service: Vascular;  Laterality: Right;  . PERIPHERAL VASCULAR CATHETERIZATION N/A 12/14/2015   Procedure: Fistulagram;  Surgeon: Conrad , MD;  Location: [place] CV LAB;  Service: Cardiovascular;  Laterality: N/A;  . SHOULDER SURGERY Right    fracture  . ULTRASOUND GUIDANCE FOR VASCULAR ACCESS  04/15/2017   Procedure: Ultrasound Guidance For Vascular Access;  Surgeon: Angelia Mould, MD;  Location: [place] CV LAB;  Service: Cardiovascular;;  . UPPER EXTREMITY VENOGRAPHY Bilateral 12/17/2016   Procedure: Bilateral Upper Extremity Venography;  Surgeon: Serafina Mitchell, MD;  Location: [place] CV LAB;  Service: Cardiovascular;  Laterality: Bilateral;  . UPPER EXTREMITY VENOGRAPHY Left 11/03/2018   Procedure: UPPER EXTREMITY VENOGRAPHY;  Surgeon: Serafina Mitchell, MD;  Location: [place]  CV LAB;  Service: Cardiovascular;  Laterality: Left;   Social History   Socioeconomic History  . Marital status: Single    Spouse name: Not on file  . Number of children: 2  . Years of education: Not on file  . Highest education level: Not on file  Occupational History  . Occupation: retired, Licensed conveyancer    Employer: RETIRED  Tobacco Use  . Smoking status: Former Smoker    Years: 25.00    Quit date: 04/29/2004    Years since quitting: 15.2  . Smokeless tobacco: Former Systems developer    Types: Chew    Quit date: 01/16/1987  . Tobacco comment: quit 2006  Substance and Sexual Activity  . Alcohol use: No  . Drug use: No  . Sexual  activity: Not on file  Other Topics Concern  . Not on file  Social History Narrative   Lives alone   Daughter 20-min away   Caffeine use: 32oz soda per day   Social Determinants of Health   Financial Resource Strain:   . Difficulty of Paying Living Expenses:   Food Insecurity:   . Worried About Charity fundraiser in the Last Year:   . Arboriculturist in the Last Year:   Transportation Needs:   . Film/video editor (Medical):   Marland Kitchen Lack of Transportation (Non-Medical):   Physical Activity:   . Days of Exercise per Week:   . Minutes of Exercise per Session:   Stress:   . Feeling of Stress :   Social Connections:   . Frequency of Communication with Friends and Family:   . Frequency of Social Gatherings with Friends and Family:   . Attends Religious Services:   . Active Member of Clubs or Organizations:   . Attends Archivist Meetings:   Marland Kitchen Marital Status:    Family History  Problem Relation Age of Onset  . Hypertension Mother   . Colon cancer Neg Hx   . Liver disease Neg Hx    - negative except otherwise stated in the family history section Allergies  Allergen Reactions  . Aspirin Other (See Comments)    Causes internal bleeding  History of ulcers   Prior to Admission medications   Medication Sig Start Date End Date Taking? Authorizing Provider  acetaminophen (TYLENOL) 325 MG tablet Take 1-2 tablets (325-650 mg total) by mouth every 6 (six) hours as needed for mild pain (pain score 1-3 or temp > 100.5). Patient taking differently: Take 650 mg by mouth every 6 (six) hours as needed for mild pain (pain score 1-3 or temp > 100.5).  07/22/19  Yes Persons, Bevely Palmer, PA  Amino Acids-Protein Hydrolys (FEEDING SUPPLEMENT, PRO-STAT SUGAR FREE 64,) LIQD Take 30 mLs by mouth in the morning and at bedtime.   Yes [provider]  atorvastatin (LIPITOR) 40 MG tablet Take 40 mg by mouth daily. 11/03/15  Yes [provider]  b complex-vitamin c-folic acid  (NEPHRO-VITE) 0.8 MG TABS Take 1 tablet by mouth See admin instructions. Take one tablet daily on Tuesdays, Thursdays, Saturdays, and Sundays.   Yes [provider]  cholecalciferol (VITAMIN D3) 25 MCG (1000 UT) tablet Take 1,000 Units by mouth daily.   Yes [provider]  cinacalcet (SENSIPAR) 60 MG tablet Take 60 mg by mouth every evening.    Yes [provider]  clopidogrel (PLAVIX) 75 MG tablet Take 75 mg by mouth daily. 05/24/19  Yes [provider]  doxycycline (VIBRAMYCIN) 100  MG capsule Take 100 mg by mouth 2 (two) times daily.   Yes [provider]  melatonin 3 MG TABS tablet Take 3 mg by mouth at bedtime.   Yes [provider]  midodrine (PROAMATINE) 10 MG tablet Take 10 mg by mouth every Monday, Wednesday, and Friday. before dialysis. 09/30/18  Yes [provider]  Nutritional Supplements (FEEDING SUPPLEMENT, NEPRO CARB STEADY,) LIQD Take 237 mLs by mouth 2 (two) times daily between meals. 07/22/19  Yes Persons, Bevely Palmer, [place]  oxyCODONE (OXY IR/ROXICODONE) 5 MG immediate release tablet Take 1-2 tablets (5-10 mg total) by mouth every 4 (four) hours as needed for moderate pain (pain score 4-6). 07/22/19  Yes Persons, Bevely Palmer, PA  saccharomyces boulardii (FLORASTOR) 250 MG capsule Take 250 mg by mouth 2 (two) times daily.   Yes [provider]  sennosides-docusate sodium (SENOKOT-S) 8.6-50 MG tablet Take 1 tablet by mouth daily.   Yes [provider]  sevelamer (RENVELA) 800 MG tablet Take 1,600-3,200 mg by mouth See admin instructions. Take 3200  mg by mouth 3 times daily with full meals and take 1600 mg by mouth with snacks.   Yes [provider]  vitamin C (ASCORBIC ACID) 500 MG tablet Take 500 mg by mouth daily.   Yes [provider]   DG Chest 2 View  Result Date: 08/10/2019 CLINICAL DATA:  Hiccups, post amputation LEFT foot, hypertension, end-stage renal disease EXAM: CHEST - 2 VIEW  COMPARISON:  06/30/2019 FINDINGS: Normal heart size, mediastinal contours, and pulmonary vascularity. Atherosclerotic calcification aorta. Mild chronic elevation of LEFT diaphragm with LEFT basilar atelectasis. Lungs otherwise clear. No acute infiltrate, pleural effusion or pneumothorax. BILATERAL chronic rotator cuff tears. Prior rodding of RIGHT humerus. IMPRESSION: Chronic LEFT basilar atelectasis. No acute abnormalities. Electronically Signed   By: Lavonia Dana M.D.   On: 08/10/2019 08:19   CT HEAD WO CONTRAST  Result Date: 08/10/2019 CLINICAL DATA:  Encephalopathy. EXAM: CT HEAD WITHOUT CONTRAST TECHNIQUE: Contiguous axial images were obtained from the base of the skull through the vertex without intravenous contrast. COMPARISON:  Head CT 03/05/2015, report from brain MRI 06/15/1999 (images unavailable). FINDINGS: Brain: There is no evidence of acute intracranial hemorrhage, intracranial mass, midline shift or extra-axial fluid collection.No evidence of acute demarcated cortical infarct. Redemonstrated small chronic cortically based infarct within the posterior right frontal lobe. Mild scattered hypoattenuation within the cerebral white matter is similar to prior exam and nonspecific, but consistent with chronic small vessel ischemic disease. Stable, mild generalized parenchymal atrophy. Vascular: No hyperdense vessel.  Atherosclerotic calcifications. Skull: Normal. Negative for fracture or focal lesion. Sinuses/Orbits: Visualized orbits demonstrate no acute abnormality. Mild right frontal sinus mucosal thickening. No significant mastoid effusion at the imaged levels. IMPRESSION: No evidence of acute intracranial abnormality. Redemonstrated small chronic cortically based right frontal lobe infarct. Stable background mild generalized parenchymal atrophy and chronic small vessel ischemic disease. Right frontal sinus mucosal thickening. Electronically Signed   By: Kellie Simmering DO   On: 08/10/2019 13:52   DG  Foot Complete Left  Result Date: 08/10/2019 CLINICAL DATA:  Postop, pain, discharge, odor EXAM: LEFT FOOT - COMPLETE 3+ VIEW COMPARISON:  Portable exam 0746 hours compared to 06/30/2019 FINDINGS: Interval transmetatarsal amputation. Linear bone fragment identified at amputation bed of first metatarsal. Osseous demineralization. Scattered atherosclerotic calcifications. No fracture, dislocation, or definite bone destruction. IMPRESSION: Postoperative changes of transmetatarsal amputation LEFT foot as above. If there is persistent clinical concern for osteomyelitis or soft tissue infection, consider MR. Electronically  Signed   By: Lavonia Dana M.D.   On: 08/10/2019 08:16   - pertinent xrays, CT, MRI studies were reviewed and independently interpreted  Positive ROS: All other systems have been reviewed and were otherwise negative with the exception of those mentioned in the HPI and as above.  Physical Exam: General: Alert, no acute distress Psychiatric: Patient is competent for consent with normal mood and affect Lymphatic: No axillary or cervical lymphadenopathy Cardiovascular: No pedal edema Respiratory: No cyanosis, no use of accessory musculature GI: No organomegaly, abdomen is soft and non-tender    Images:  @ENCIMAGES @  Labs:  Lab Results  Component Value Date   ESRSEDRATE 36 (H) 08/10/2019   CRP 25.7 (H) 08/10/2019   REPTSTATUS PENDING 08/10/2019   GRAMSTAIN  02/18/2017    RARE WBC PRESENT, PREDOMINANTLY PMN FEW GRAM POSITIVE COCCI    CULT PENDING 08/10/2019    Lab Results  Component Value Date   ALBUMIN 1.9 (L) 08/11/2019   ALBUMIN 1.8 (L) 08/10/2019   ALBUMIN 2.1 (L) 08/10/2019    Neurologic: Patient does not have protective sensation bilateral lower extremities.   MUSCULOSKELETAL:   Skin: Examination patient's left foot is cold and ischemic.  There is dry gangrenous changes there is no wound dehiscence.  Patient's calf is warm down to the ankle and distal to the  ankle and is cold to the touch.  Patient does not have a palpable dorsalis pedis pulse.  Assessment: Assessment: Acute dry gangrene left foot status post transmetatarsal amputation.  Plan: With the cold gangrenous changes to the foot have recommended proceeding with a transtibial amputation.  Patient's calf is warm with normal color and texture.  Feel that patient has had an acute embolic or thrombotic event to his foot and does not have any further foot salvage intervention options.  Will plan for transtibial amputation today.  Risks and benefits were discussed including risk of the wound not healing need for additional surgery.  Thank you for the consult and the opportunity to see [name], [place] [phone] 7:27 AM

## 2019-08-11 NOTE — Progress Notes (Signed)
Pharmacy Antibiotic Note  [name]. is a 79 y.o. male admitted on 08/09/2019 with wound infection.  Pharmacy has been consulted for vancomycin/zosyn dosing. ESRD on HD MWF. S/p recent TMT amputation on left, per notes needs BKA at this point. Appears dialysis provider attempted to call Ortho to start antibiotics at HD but does not appear anything was started due to patient transfer to ED.  Patient received loading dose of vancomycin 1.5g IV without any further HD. Pt was going to be switched to ampicillin, however due to ease of difficulty of administering ampicillin with HD, the decision was made to switch back to vancomycin.  Patient is afebrile, vital signs are stable, WBC is decreasing from 41 to 38.6.  Plan: Vancomycin 750mg  IV qHD MWF F/u HD schedule/tolerance  Monitor clinical progress, c/s, pre-HD vancomycin level as indicated F/u Surgery plans for BKA and antibiotic plan/LOT post-op    Height: 5\' 5"  (165.1 cm) Weight: 64 kg (141 lb 1.5 oz) IBW/kg (Calculated) : 61.5  Temp (24hrs), Avg:97.9 F (36.6 C), Min:97.7 F (36.5 C), Max:98.2 F (36.8 C)  Recent Labs  Lab 08/09/19 1630 08/09/19 2108 08/10/19 0451 08/10/19 0822 08/10/19 1121 08/10/19 1134 08/11/19 0306  WBC  --  41.0*  --   --   --   --  38.6*  CREATININE 9.97*  --   --   --   --  10.75* 11.38*  LATICACIDVEN  --   --  2.8* 2.4* 2.9*  --   --     Estimated Creatinine Clearance: 4.8 mL/min (A) (by C-G formula based on SCr of 11.38 mg/dL (H)).    Allergies  Allergen Reactions  . Aspirin Other (See Comments)    Causes internal bleeding  History of ulcers    Antimicrobials this admission: 4/13 vancomycin >>  4/13 zosyn x 3 doses   Microbiology results: 3/24 Nasal MRSA PCR + 4/13 Bcx: Enteococcus (f/u susc) 4/13 COVID: negative   Sherren Kerns, PharmD PGY1 Acute Care Pharmacy Resident Please check AMION for all [place] contact numbers Clinical Pharmacist 08/11/2019 9:06 AM

## 2019-08-11 NOTE — Progress Notes (Signed)
Pt came back to rm 4E05 from dialysis. VSS.   Lavenia Atlas, RN

## 2019-08-11 NOTE — Progress Notes (Signed)
PHARMACY - PHYSICIAN COMMUNICATION CRITICAL VALUE ALERT - BLOOD CULTURE IDENTIFICATION (BCID)  Delane Wessinger. is an 79 y.o. male who presented to [facility] on 08/09/2019 with a chief complaint of AMS/sepsis  Assessment:  2/2 Blood cultures growing Enterocccus  Name of physician (or Provider) Contacted:  Dr. Myna Hidalgo  Current antibiotics:   Vancomycin and Zosyn   Changes to prescribed antibiotics recommended:   Change to Ampicillin 2 g IV q12h (adjusted for renal function)  Results for orders placed or performed during the hospital encounter of 08/09/19  Blood Culture ID Panel (Reflexed) (Collected: 08/10/2019  4:30 AM)  Result Value Ref Range   Enterococcus species DETECTED (A) NOT DETECTED   Vancomycin resistance NOT DETECTED NOT DETECTED   Listeria monocytogenes NOT DETECTED NOT DETECTED   Staphylococcus species NOT DETECTED NOT DETECTED   Staphylococcus aureus (BCID) NOT DETECTED NOT DETECTED   Streptococcus species NOT DETECTED NOT DETECTED   Streptococcus agalactiae NOT DETECTED NOT DETECTED   Streptococcus pneumoniae NOT DETECTED NOT DETECTED   Streptococcus pyogenes NOT DETECTED NOT DETECTED   Acinetobacter baumannii NOT DETECTED NOT DETECTED   Enterobacteriaceae species NOT DETECTED NOT DETECTED   Enterobacter cloacae complex NOT DETECTED NOT DETECTED   Escherichia coli NOT DETECTED NOT DETECTED   Klebsiella oxytoca NOT DETECTED NOT DETECTED   Klebsiella pneumoniae NOT DETECTED NOT DETECTED   Proteus species NOT DETECTED NOT DETECTED   Serratia marcescens NOT DETECTED NOT DETECTED   Haemophilus influenzae NOT DETECTED NOT DETECTED   Neisseria meningitidis NOT DETECTED NOT DETECTED   Pseudomonas aeruginosa NOT DETECTED NOT DETECTED   Candida albicans NOT DETECTED NOT DETECTED   Candida glabrata NOT DETECTED NOT DETECTED   Candida krusei NOT DETECTED NOT DETECTED   Candida parapsilosis NOT DETECTED NOT DETECTED   Candida tropicalis NOT DETECTED NOT DETECTED     Caryl Pina 08/11/2019  3:56 AM

## 2019-08-11 NOTE — Progress Notes (Signed)
Patient ID: [name]., male   DOB: 1940-05-04, 79 y.o.   MRN: [id number]  [facility] Progress Note   Assessment/ Plan:   1.  Altered mental status/leukocytosis: Consistent with acute illness delirium likely from infected/nonhealing ulcer of the left TMA.  On broad-spectrum antibiotic coverage with plans noted for left below-knee amputation. 2.  End-stage renal disease: Will get hemodialysis today to continue usual outpatient Monday/Wednesday/Friday schedule.    Volume status and electrolytes within acceptable limits. 3.  Peripheral vascular disease status post left TMA: Evaluated earlier by Dr. Sharol Given and plans noted for left below-knee amputation. 4.  Hypertension: Blood pressures currently at goal, continue midodrine. 5.  Secondary hyperparathyroidism: Phosphorus levels are marginally low, will discontinue sevelamer at this time.  Continue cinacalcet/[name]. 6.  Anemia of chronic illness: Hemoglobin and hematocrit currently at goal, will monitor for postoperative losses.  Subjective:   Reports to be feeling fair and plans noted for left BKA today.   Objective:   BP (!) 111/52 (BP Location: Right Arm)   Pulse 82   Temp 97.7 F (36.5 C) (Oral)   Resp 18   Ht 5\' 5"  (1.651 m)   Wt 64 kg   SpO2 94%   BMI 23.48 kg/m   Physical Exam: Gen: Appears comfortable resting in bed CVS: Pulse regular rhythm, normal rate, S1 and S2 normal Resp: Clear to auscultation bilaterally, no rales/rhonchi Abd: Soft, flat, nontender Ext: Left TMA stump with gangrenous changes, right TMA stump dusky.  Left thigh AV graft with palpable thrill.  Labs: BMET Recent Labs  Lab 08/09/19 1630 08/10/19 1134 08/11/19 0306  NA 134* 133* 131*  K 5.2* 4.9 5.3*  CL 95* 93* 92*  CO2 23 25 22   GLUCOSE 93 113* 70  BUN 59* 69* 77*  CREATININE 9.97* 10.75* 11.38*  CALCIUM 9.7 9.7 9.9  PHOS  --  2.8 2.5   CBC Recent Labs  Lab 08/09/19 2108 08/11/19 0306  WBC 41.0* 38.6*  HGB 12.0*  12.5*  HCT 36.0* 38.0*  MCV 62.4* 61.9*  PLT 435* 380      Medications:    . atorvastatin  40 mg Oral Daily  . Chlorhexidine Gluconate Cloth  6 each Topical Q0600  . cinacalcet  60 mg Oral QPM  . heparin  5,000 Units Subcutaneous Q8H  . melatonin  3 mg Oral QHS  . midodrine  10 mg Oral Q M,W,F  . multivitamin  1 tablet Oral QHS  . mupirocin ointment  1 application Nasal BID  . saccharomyces boulardii  250 mg Oral BID  . senna-docusate  1 tablet Oral Daily  . sevelamer carbonate  3,200 mg Oral TID WC   And  . sevelamer carbonate  1,600 mg Oral With snacks   Elmarie Shiley, MD 08/11/2019, 9:30 AM

## 2019-08-11 NOTE — H&P (View-Only) (Signed)
ORTHOPAEDIC CONSULTATION  REQUESTING PHYSICIAN: Mercy Riding, MD  Chief Complaint: Cold gangrenous changes left transmetatarsal amputation.  HPI: [name]. is a 79 y.o. male who presents with dry gangrene left foot.  Patient is status post transmetatarsal amputation the wound edges are well approximated however patient's foot has developed dry gangrenous changes circumferentially around the hindfoot and midfoot.  Past Medical History:  Diagnosis Date   Anxiety    Aortic stenosis    Arthritis    left hand   CVA (cerebral infarction)    ESRD (end stage renal disease) on dialysis ([place])    M/W/F at [facility] in [place] hypertension    resolved with HD   Gangrene of right foot ([place])    Gastric ulcer 2004   GI bleed    gastric ulcer   Heart murmur    History of blood transfusion    History of cardiomyopathy    LVEF normal as of February 2017   History of gastric ulcer    History of stroke    Left side weakness   Iron deficiency anemia    Osteomyelitis (HCC)    Peripheral vascular disease ([place])    Stroke ([place]) 2000   limp left   Past Surgical History:  Procedure Laterality Date   ABDOMINAL AORTAGRAM N/A 01/24/2012   Procedure: ABDOMINAL Maxcine Ham;  Surgeon: Elam Dutch, MD;  Location: [facility] CATH LAB;  Service: Cardiovascular;  Laterality: N/A;   ABDOMINAL AORTOGRAM W/LOWER EXTREMITY N/A 04/15/2017   Procedure: ABDOMINAL AORTOGRAM W/LOWER EXTREMITY;  Surgeon: Angelia Mould, MD;  Location: [place] CV LAB;  Service: Cardiovascular;  Laterality: N/A;   AMPUTATION Right 05/09/2017   Procedure: RIGHT TRANSMETATARSAL AMPUTATION;  Surgeon: [name], MD;  Location: [place];  Service: Orthopedics;  Laterality: Right;   AMPUTATION Right 06/13/2018   Procedure: AMPUTATION RIGHT LONG FINGER;  Surgeon: Dayna Barker, MD;  Location: [place];  Service: Plastics;  Laterality: Right;   AMPUTATION Left 07/21/2019   Procedure: LEFT  TRANSMETATARSAL AMPUTATION;  Surgeon: [name], MD;  Location: [place];  Service: Orthopedics;  Laterality: Left;   ARTERIOVENOUS GRAFT PLACEMENT Right right arm   AV FISTULA PLACEMENT Left 08/31/2015   Procedure: ARTERIOVENOUS (AV) FISTULA CREATION- LEFT RADIOCEPHALIC;  Surgeon: Mal Misty, MD;  Location: [place];  Service: Vascular;  Laterality: Left;   AV FISTULA PLACEMENT Right 02/18/2017   Procedure: INSERTION OF ARTERIOVENOUS (AV) GORE-TEX GRAFT  RIGHT UPPER ARM;  Surgeon: Conrad Chester Hill, MD;  Location: [place];  Service: Vascular;  Laterality: Right;   AV FISTULA PLACEMENT Left 12/01/2018   Procedure: INSERTION OF ARTERIOVENOUS (AV) GORE-TEX GRAFT ARM;  Surgeon: Angelia Mould, MD;  Location: [place];  Service: Vascular;  Laterality: Left;   AV FISTULA PLACEMENT Left 02/09/2019   Procedure: INSERTION OF ARTERIOVENOUS (AV) GORE-TEX GRAFT LEFT THIGH;  Surgeon: Waynetta Sandy, MD;  Location: [place];  Service: Vascular;  Laterality: Left;   [place] Left 12/19/2015   Procedure: FIRST STAGE BRACHIAL VEIN TRANSPOSITION;  Surgeon: Conrad Fruitland, MD;  Location: [place];  Service: Vascular;  Laterality: Left;   [place] Left 02/08/2016   Procedure: SECOND STAGE BRACHIAL VEIN TRANSPOSITION;  Surgeon: Conrad , MD;  Location: [place];  Service: Vascular;  Laterality: Left;   CARDIAC CATHETERIZATION N/A 07/11/2015   Procedure: Left Heart Cath and Coronary Angiography;  Surgeon: Troy Sine, MD;  Location: [place] CV LAB;  Service: Cardiovascular;  Laterality: N/A;   Carpel Tunnel Left Dec. 22, 2016   CHOLECYSTECTOMY     COLONOSCOPY  2004   Dr. Irving Shows, left sided diverticula and cecal polyp, path unknown   COLONOSCOPY  10/29/2011   Procedure: COLONOSCOPY;  Surgeon: Daneil Dolin, MD;  [facility];  Service: Endoscopy;  Laterality: N/A;  10:15   ESOPHAGOGASTRODUODENOSCOPY  11/2002   Dr. Gala Romney, erosive reflux  esophagitis, multiple gastric ulcer and antral/bulbar erosions. Serologies positive for H.Pylori and was treated   ESOPHAGOGASTRODUODENOSCOPY  11/20014   Dr. Gala Romney, small hh only, ulcers healed   ESOPHAGOGASTRODUODENOSCOPY  09/21/2011   Dr Trevor Iha [facility], antral erosions, ?early [place]   FISTULOGRAM Left 12/10/2016   Procedure: THROMBECTOMY OF LEFT ARM ARTERIOVENOUS FISTULA;  Surgeon: Waynetta Sandy, MD;  Location: [place];  Service: Vascular;  Laterality: Left;   INSERTION OF DIALYSIS CATHETER Left 12/10/2016   Procedure: INSERTION OF TUNNELED DIALYSIS CATHETER;  Surgeon: Waynetta Sandy, MD;  Location: [place];  Service: Vascular;  Laterality: Left;   INSERTION OF DIALYSIS CATHETER Right 06/09/2018   Procedure: INSERTION OF DIALYSIS CATHETER Right subclavian;  Surgeon: Angelia Mould, MD;  Location: [place];  Service: Vascular;  Laterality: Right;   IR DIALY SHUNT INTRO [place] W/IMG RIGHT Right 01/01/2018   IR GENERIC HISTORICAL  07/16/2016   IR REMOVAL TUN CV CATH W/O FL 07/16/2016 Saverio Danker, PA-C [facility]-INTERV RAD   IR PTA ADDL CENTRAL DIALYSIS SEG THRU DIALY CIRCUIT RIGHT Right 10/21/2017   IR REMOVAL TUN CV CATH W/O FL  05/12/2017   IR THROMBECTOMY AV FISTULA W/THROMBOLYSIS/PTA INC/SHUNT/IMG RIGHT Right 10/21/2017   IR THROMBECTOMY AV FISTULA W/THROMBOLYSIS/PTA INC/SHUNT/IMG RIGHT Right 11/27/2017   IR US GUIDE VASC ACCESS RIGHT  10/21/2017   IR US GUIDE VASC ACCESS RIGHT  11/27/2017   IR US GUIDE VASC ACCESS RIGHT  01/01/2018   LIGATION ARTERIOVENOUS GORTEX GRAFT Right 06/09/2018   LIGATION ARTERIOVENOUS GORTEX GRAFT Right 06/09/2018   Procedure: LIGATION ARTERIOVENOUS GORTEX GRAFT RIGHT ARM;  Surgeon: Angelia Mould, MD;  Location: [place];  Service: Vascular;  Laterality: Right;   LIGATION ARTERIOVENOUS GORTEX GRAFT Left 12/31/2018   Procedure: LIGATION OF LEFT UPPER ARM ARTERIVENOUS GORTEX GRAFT, LEFT BRACHIAL ARTERY ENDARTERECTOMY WITH  BOVINE PATCH ANGIOPLASTY;  Surgeon: Waynetta Sandy, MD;  Location: [place];  Service: Vascular;  Laterality: Left;   LIGATION OF ARTERIOVENOUS  FISTULA Left 12/19/2015   Procedure: LIGATION OF RADIOCEPHALIC ARTERIOVENOUS  FISTULA;  Surgeon: Conrad Glenvil, MD;  Location: [place];  Service: Vascular;  Laterality: Left;   MASS EXCISION Right 02/18/2017   Procedure: EXCISION OF RIGHT AXILLARY EPIDERMAL INCLUSION CYST;  Surgeon: Conrad Sonoma, MD;  Location: [place];  Service: Vascular;  Laterality: Right;   PERIPHERAL VASCULAR CATHETERIZATION N/A 12/14/2015   Procedure: Fistulagram;  Surgeon: Conrad Swansea, MD;  Location: [place] CV LAB;  Service: Cardiovascular;  Laterality: N/A;   SHOULDER SURGERY Right    fracture   ULTRASOUND GUIDANCE FOR VASCULAR ACCESS  04/15/2017   Procedure: Ultrasound Guidance For Vascular Access;  Surgeon: Angelia Mould, MD;  Location: [place] CV LAB;  Service: Cardiovascular;;   UPPER EXTREMITY VENOGRAPHY Bilateral 12/17/2016   Procedure: Bilateral Upper Extremity Venography;  Surgeon: Serafina Mitchell, MD;  Location: [place] CV LAB;  Service: Cardiovascular;  Laterality: Bilateral;   UPPER EXTREMITY VENOGRAPHY Left 11/03/2018   Procedure: UPPER EXTREMITY VENOGRAPHY;  Surgeon: Serafina Mitchell, MD;  Location: [place]  CV LAB;  Service: Cardiovascular;  Laterality: Left;   Social History   Socioeconomic History   Marital status: Single    Spouse name: Not on file   Number of children: 2   Years of education: Not on file   Highest education level: Not on file  Occupational History   Occupation: retired, Licensed conveyancer    Employer: RETIRED  Tobacco Use   Smoking status: Former Smoker    Years: 25.00    Quit date: 04/29/2004    Years since quitting: 15.2   Smokeless tobacco: Former Systems developer    Types: [employer] date: 01/16/1987   Tobacco comment: quit 2006  Substance and Sexual Activity   Alcohol use: No   Drug use: No   Sexual  activity: Not on file  Other Topics Concern   Not on file  Social History Narrative   Lives alone   Daughter 20-min away   Caffeine use: 32oz soda per day   Social Determinants of Health   Financial Resource Strain:    Difficulty of Paying Living Expenses:   Food Insecurity:    Worried About Charity fundraiser in the Last Year:    Arboriculturist in the Last Year:   Transportation Needs:    Film/video editor (Medical):    Lack of Transportation (Non-Medical):   Physical Activity:    Days of Exercise per Week:    Minutes of Exercise per Session:   Stress:    Feeling of Stress :   Social Connections:    Frequency of Communication with Friends and Family:    Frequency of Social Gatherings with Friends and Family:    Attends Religious Services:    Active Member of Clubs or Organizations:    Attends Music therapist:    Marital Status:    Family History  Problem Relation Age of Onset   Hypertension Mother    Colon cancer Neg Hx    Liver disease Neg Hx    - negative except otherwise stated in the family history section Allergies  Allergen Reactions   Aspirin Other (See Comments)    Causes internal bleeding  History of ulcers   Prior to Admission medications   Medication Sig Start Date End Date Taking? Authorizing Provider  acetaminophen (TYLENOL) 325 MG tablet Take 1-2 tablets (325-650 mg total) by mouth every 6 (six) hours as needed for mild pain (pain score 1-3 or temp > 100.5). Patient taking differently: Take 650 mg by mouth every 6 (six) hours as needed for mild pain (pain score 1-3 or temp > 100.5).  07/22/19  Yes Persons, Bevely Palmer, PA  Amino Acids-Protein Hydrolys (FEEDING SUPPLEMENT, PRO-STAT SUGAR FREE 64,) LIQD Take 30 mLs by mouth in the morning and at bedtime.   Yes [provider]  atorvastatin (LIPITOR) 40 MG tablet Take 40 mg by mouth daily. 11/03/15  Yes [provider]  b complex-vitamin c-folic acid  (NEPHRO-VITE) 0.8 MG TABS Take 1 tablet by mouth See admin instructions. Take one tablet daily on Tuesdays, Thursdays, Saturdays, and Sundays.   Yes [provider]  cholecalciferol (VITAMIN D3) 25 MCG (1000 UT) tablet Take 1,000 Units by mouth daily.   Yes [provider]  cinacalcet (SENSIPAR) 60 MG tablet Take 60 mg by mouth every evening.    Yes [provider]  clopidogrel (PLAVIX) 75 MG tablet Take 75 mg by mouth daily. 05/24/19  Yes [provider]  doxycycline (VIBRAMYCIN) 100  MG capsule Take 100 mg by mouth 2 (two) times daily.   Yes [provider]  melatonin 3 MG TABS tablet Take 3 mg by mouth at bedtime.   Yes [provider]  midodrine (PROAMATINE) 10 MG tablet Take 10 mg by mouth every Monday, Wednesday, and Friday. before dialysis. 09/30/18  Yes [provider]  Nutritional Supplements (FEEDING SUPPLEMENT, NEPRO CARB STEADY,) LIQD Take 237 mLs by mouth 2 (two) times daily between meals. 07/22/19  Yes Persons, Bevely Palmer, [place]  oxyCODONE (OXY IR/ROXICODONE) 5 MG immediate release tablet Take 1-2 tablets (5-10 mg total) by mouth every 4 (four) hours as needed for moderate pain (pain score 4-6). 07/22/19  Yes Persons, Bevely Palmer, PA  saccharomyces boulardii (FLORASTOR) 250 MG capsule Take 250 mg by mouth 2 (two) times daily.   Yes [provider]  sennosides-docusate sodium (SENOKOT-S) 8.6-50 MG tablet Take 1 tablet by mouth daily.   Yes [provider]  sevelamer (RENVELA) 800 MG tablet Take 1,600-3,200 mg by mouth See admin instructions. Take 3200  mg by mouth 3 times daily with full meals and take 1600 mg by mouth with snacks.   Yes [provider]  vitamin C (ASCORBIC ACID) 500 MG tablet Take 500 mg by mouth daily.   Yes [provider]   DG Chest 2 View  Result Date: 08/10/2019 CLINICAL DATA:  Hiccups, post amputation LEFT foot, hypertension, end-stage renal disease EXAM: CHEST - 2 VIEW  COMPARISON:  06/30/2019 FINDINGS: Normal heart size, mediastinal contours, and pulmonary vascularity. Atherosclerotic calcification aorta. Mild chronic elevation of LEFT diaphragm with LEFT basilar atelectasis. Lungs otherwise clear. No acute infiltrate, pleural effusion or pneumothorax. BILATERAL chronic rotator cuff tears. Prior rodding of RIGHT humerus. IMPRESSION: Chronic LEFT basilar atelectasis. No acute abnormalities. Electronically Signed   By: Lavonia Dana M.D.   On: 08/10/2019 08:19   CT HEAD WO CONTRAST  Result Date: 08/10/2019 CLINICAL DATA:  Encephalopathy. EXAM: CT HEAD WITHOUT CONTRAST TECHNIQUE: Contiguous axial images were obtained from the base of the skull through the vertex without intravenous contrast. COMPARISON:  Head CT 03/05/2015, report from brain MRI 06/15/1999 (images unavailable). FINDINGS: Brain: There is no evidence of acute intracranial hemorrhage, intracranial mass, midline shift or extra-axial fluid collection.No evidence of acute demarcated cortical infarct. Redemonstrated small chronic cortically based infarct within the posterior right frontal lobe. Mild scattered hypoattenuation within the cerebral white matter is similar to prior exam and nonspecific, but consistent with chronic small vessel ischemic disease. Stable, mild generalized parenchymal atrophy. Vascular: No hyperdense vessel.  Atherosclerotic calcifications. Skull: Normal. Negative for fracture or focal lesion. Sinuses/Orbits: Visualized orbits demonstrate no acute abnormality. Mild right frontal sinus mucosal thickening. No significant mastoid effusion at the imaged levels. IMPRESSION: No evidence of acute intracranial abnormality. Redemonstrated small chronic cortically based right frontal lobe infarct. Stable background mild generalized parenchymal atrophy and chronic small vessel ischemic disease. Right frontal sinus mucosal thickening. Electronically Signed   By: Kellie Simmering DO   On: 08/10/2019 13:52   DG  Foot Complete Left  Result Date: 08/10/2019 CLINICAL DATA:  Postop, pain, discharge, odor EXAM: LEFT FOOT - COMPLETE 3+ VIEW COMPARISON:  Portable exam 0746 hours compared to 06/30/2019 FINDINGS: Interval transmetatarsal amputation. Linear bone fragment identified at amputation bed of first metatarsal. Osseous demineralization. Scattered atherosclerotic calcifications. No fracture, dislocation, or definite bone destruction. IMPRESSION: Postoperative changes of transmetatarsal amputation LEFT foot as above. If there is persistent clinical concern for osteomyelitis or soft tissue infection, consider MR. Electronically  Signed   By: Lavonia Dana M.D.   On: 08/10/2019 08:16   - pertinent xrays, CT, MRI studies were reviewed and independently interpreted  Positive ROS: All other systems have been reviewed and were otherwise negative with the exception of those mentioned in the HPI and as above.  Physical Exam: General: Alert, no acute distress Psychiatric: Patient is competent for consent with normal mood and affect Lymphatic: No axillary or cervical lymphadenopathy Cardiovascular: No pedal edema Respiratory: No cyanosis, no use of accessory musculature GI: No organomegaly, abdomen is soft and non-tender    Images:  @ENCIMAGES @  Labs:  Lab Results  Component Value Date   ESRSEDRATE 36 (H) 08/10/2019   CRP 25.7 (H) 08/10/2019   REPTSTATUS PENDING 08/10/2019   GRAMSTAIN  02/18/2017    RARE WBC PRESENT, PREDOMINANTLY PMN FEW GRAM POSITIVE COCCI    CULT PENDING 08/10/2019    Lab Results  Component Value Date   ALBUMIN 1.9 (L) 08/11/2019   ALBUMIN 1.8 (L) 08/10/2019   ALBUMIN 2.1 (L) 08/10/2019    Neurologic: Patient does not have protective sensation bilateral lower extremities.   MUSCULOSKELETAL:   Skin: Examination patient's left foot is cold and ischemic.  There is dry gangrenous changes there is no wound dehiscence.  Patient's calf is warm down to the ankle and distal to the  ankle and is cold to the touch.  Patient does not have a palpable dorsalis pedis pulse.  Assessment: Assessment: Acute dry gangrene left foot status post transmetatarsal amputation.  Plan: With the cold gangrenous changes to the foot have recommended proceeding with a transtibial amputation.  Patient's calf is warm with normal color and texture.  Feel that patient has had an acute embolic or thrombotic event to his foot and does not have any further foot salvage intervention options.  Will plan for transtibial amputation today.  Risks and benefits were discussed including risk of the wound not healing need for additional surgery.  Thank you for the consult and the opportunity to see [name], [place] [phone] 7:27 AM

## 2019-08-11 NOTE — Progress Notes (Signed)
PROGRESS NOTE  Joseph FLAMMER Sr. QGB:[id number] DOB: [birth year]   PCP: [name], MD  Patient is from: Home  DOA: 08/09/2019 LOS: 1  Brief Narrative / Interim history: 79 year old male with history of ESRD on HD MWF, diastolic CHF, moderate AS, PVD, HTN, IDA and bilateral transmetatarsal amputation brought to ED due to altered mental status during dialysis.  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  Admitted for sepsis in the setting of postoperative left foot infection and found to have Enterococcus bacteremia.  ID, orthopedic surgery (Dr. Sharol Given) and nephrology following.  Subjective: Seen and examined earlier this morning.  No major events overnight of this morning.  No complaint this morning.  Plans for left transtibial amputation by Dr. Sharol Given today.  Objective: Vitals:   08/10/19 2336 08/11/19 0341 08/11/19 0342 08/11/19 0855  BP: (!) 117/52  (!) 148/66 (!) 111/52  Pulse: 91  95 82  Resp: 15  (!) 22 18  Temp: 98 F (36.7 C)  97.7 F (36.5 C) 97.7 F (36.5 C)  TempSrc: Oral  Oral Oral  SpO2: 93%  94% 94%  Weight:  64 kg    Height:        Intake/Output Summary (Last 24 hours) at 08/11/2019 1334 Last data filed at 08/11/2019 0300 Gross per 24 hour  Intake 400 ml  Output --  Net 400 ml   Filed Weights   08/10/19 2038 08/11/19 0341  Weight: 64.6 kg 64 kg    Examination:  GENERAL: No apparent distress. Nontoxic.  HEENT: MMM.  Vision and hearing grossly intact.  NECK: Supple.  No apparent JVD.  RESP:  No IWOB. Good air movement bilaterally. CVS:  RRR. Heart sounds normal.  ABD/GI/GU: BS present. Soft. Non tender.  MSK/EXT:  Moves extremities.  Bilateral transmetatarsal amputation.  Faint DP pulses bilaterally. SKIN: Surgical wound dehiscence on left foot.  Notable skin ulcer without signs of infection or fluid loculation on right foot.  See picture below for more. NEURO: Awake, alert and oriented fairly.  No apparent focal  neuro deficit. PSYCH: Calm. Normal affect.      Procedures:  None  Microbiology summarized: 4/13-COVID-19 PCR negative. 4/13-the AICD with GPC.  BICD with Enterococcus.  Assessment & Plan: Sepsis due to Enterococcus bacteremia  Postoperative left foot wound infection -Sepsis physiology improving.  CRP and ESR mildly elevated. -Plan for transtibial amputation by Dr. Sharol Given -ID following-continue vancomycin, TTE and repeat BC -Trend leukocytosis.  Acute metabolic encephalopathy: Likely due to the above.  CT head without acute finding.  No focal neuro deficit.  Seems to have resolved.  He is fairly oriented today. Suspect secondary to infection as seen above. -Monitor sepsis/bacteremia/infection as above  ESRD on HD M/W/F at the [facility] HD center:  Azotemia/hyperkalemia/hyponatremia/Anion gap metabolic acidosis/BMD Anemia of renal disease -Nephrology managing  Hypotension: Normotensive -Continue midodrine on dialysis days  Peripheral vascular disease:  -Plavix on hold for anticipated surgery.  History of CVA in 2000: No apparent neuro deficit on exam. -Continue home medications -Plavix on hold for anticipated surgery.  Chronic diastolic CHF/NICM/moderate AS: Appears euvolemic.  No cardiopulmonary distress. -Fluid management by dialysis. -Strict intake and output -Daily weights   Hyperlipidemia:  -Continue home atorvastatin  Thrombocytosis: Likely reactive from #1. -Continue monitoring  Debility -PT/OT                   DVT prophylaxis: Subcu heparin Code Status: Full code Family Communication: Patient and/or RN. Available if any  question.   Discharge barrier: Enterococcus bacteremia/postoperative left foot infection on IV antibiotics and awaiting surgical intervention Patient is from: Home Final disposition: Likely home in the next 3 to 4 days once cleared by Ortho, ID and nephrology.  Consultants:  Orthopedic surgery, Dr.  Sharol Given Infectious disease Nephrology   Sch Meds:  Scheduled Meds: . atorvastatin  40 mg Oral Daily  . Chlorhexidine Gluconate Cloth  6 each Topical Q0600  . cinacalcet  60 mg Oral QPM  . heparin  5,000 Units Subcutaneous Q8H  . melatonin  3 mg Oral QHS  . midodrine  10 mg Oral Q M,W,F  . multivitamin  1 tablet Oral QHS  . mupirocin ointment  1 application Nasal BID  . saccharomyces boulardii  250 mg Oral BID  . senna-docusate  1 tablet Oral Daily  . sevelamer carbonate  3,200 mg Oral TID WC   And  . sevelamer carbonate  1,600 mg Oral With snacks   Continuous Infusions: . sodium chloride    . sodium chloride    .  ceFAZolin (ANCEF) IV    . vancomycin     PRN Meds:.sodium chloride, sodium chloride, acetaminophen **OR** acetaminophen, albuterol, alteplase, heparin, heparin, lidocaine (PF), lidocaine-prilocaine, oxyCODONE, pentafluoroprop-tetrafluoroeth  Antimicrobials: Anti-infectives (From admission, onward)   Start     Dose/Rate Route Frequency Ordered Stop   08/11/19 1200  vancomycin (VANCOCIN) IVPB 750 mg/150 ml premix     750 mg 150 mL/hr over 60 Minutes Intravenous Every M-W-F (Hemodialysis) 08/11/19 0903     08/11/19 1000  ampicillin (OMNIPEN) 2 g in sodium chloride 0.9 % 100 mL IVPB  Status:  Discontinued     2 g 300 mL/hr over 20 Minutes Intravenous Every 12 hours 08/11/19 0405 08/11/19 0857   08/11/19 0915  vancomycin (VANCOREADY) IVPB 1250 mg/250 mL  Status:  Discontinued     1,250 mg 166.7 mL/hr over 90 Minutes Intravenous  Once 08/11/19 0903 08/11/19 0907   08/11/19 0915  ceFAZolin (ANCEF) IVPB 2g/100 mL premix     2 g 200 mL/hr over 30 Minutes Intravenous On call to O.R. 08/11/19 0909 08/12/19 0559   08/11/19 0000  vancomycin variable dose per unstable renal function (pharmacist dosing)  Status:  Discontinued      Does not apply See admin instructions 08/10/19 1108 08/11/19 0405   08/10/19 1600  piperacillin-tazobactam (ZOSYN) IVPB 2.25 g  Status:  Discontinued      2.25 g 100 mL/hr over 30 Minutes Intravenous Every 8 hours 08/10/19 1108 08/11/19 0405   08/10/19 1115  vancomycin (VANCOREADY) IVPB 500 mg/100 mL     500 mg 100 mL/hr over 60 Minutes Intravenous NOW 08/10/19 1105 08/10/19 1435   08/10/19 0745  piperacillin-tazobactam (ZOSYN) IVPB 3.375 g     3.375 g 100 mL/hr over 30 Minutes Intravenous  Once 08/10/19 0732 08/10/19 0935   08/10/19 0745  vancomycin (VANCOCIN) IVPB 1000 mg/200 mL premix     1,000 mg 200 mL/hr over 60 Minutes Intravenous  Once 08/10/19 0732 08/10/19 0959       I have personally reviewed the following labs and images: CBC: Recent Labs  Lab 08/09/19 2108 08/11/19 0306 08/11/19 1130  WBC 41.0* 38.6* 38.1*  HGB 12.0* 12.5* 11.5*  HCT 36.0* 38.0* 34.2*  MCV 62.4* 61.9* 60.7*  PLT 435* 380 362   BMP &GFR Recent Labs  Lab 08/09/19 1630 08/10/19 1134 08/11/19 0306 08/11/19 1130  NA 134* 133* 131* 132*  K 5.2* 4.9 5.3* 5.5*  CL 95* 93*  92* 92*  CO2 _0 20*  GLUCOSE 93 113* 70 70  BUN 59* 69* 77* 81*  CREATININE 9.97* 10.75* 11.38* 11.57*  CALCIUM 9.7 9.7 9.9 9.8  PHOS  --  2.8 2.5  --    Estimated Creatinine Clearance: 4.7 mL/min (A) (by C-G formula based on SCr of 11.57 mg/dL (H)). Liver & Pancreas: Recent Labs  Lab 08/10/19 0822 08/10/19 1134 08/11/19 0306  AST 68*  --   --   ALT 21  --   --   ALKPHOS 72  --   --   BILITOT 0.9  --   --   PROT 7.9  --   --   ALBUMIN 2.1* 1.8* 1.9*   Recent Labs  Lab 08/10/19 0822  LIPASE 20   No results for input(s): AMMONIA in the last 168 hours. Diabetic: No results for input(s): HGBA1C in the last 72 hours. Recent Labs  Lab 08/10/19 0652  GLUCAP 114*   Cardiac Enzymes: No results for input(s): CKTOTAL, CKMB, CKMBINDEX, TROPONINI in the last 168 hours. No results for input(s): PROBNP in the last 8760 hours. Coagulation Profile: No results for input(s): INR, PROTIME in the last 168 hours. Thyroid Function Tests: No results for input(s):  TSH, T4TOTAL, FREET4, T3FREE, THYROIDAB in the last 72 hours. Lipid Profile: No results for input(s): CHOL, HDL, LDLCALC, TRIG, CHOLHDL, LDLDIRECT in the last 72 hours. Anemia Panel: No results for input(s): VITAMINB12, FOLATE, FERRITIN, TIBC, IRON, RETICCTPCT in the last 72 hours. Urine analysis: No results found for: COLORURINE, APPEARANCEUR, LABSPEC, PHURINE, GLUCOSEU, HGBUR, BILIRUBINUR, KETONESUR, PROTEINUR, UROBILINOGEN, NITRITE, LEUKOCYTESUR Sepsis Labs: Invalid input(s): PROCALCITONIN, [place]  Microbiology: Recent Results (from the past 240 hour(s))  Blood culture (routine x 2)     Status: None (Preliminary result)   Collection Time: 08/10/19  4:30 AM   Specimen: BLOOD LEFT ARM  Result Value Ref Range Status   Specimen Description BLOOD LEFT ARM  Final   Special Requests   Final    BOTTLES DRAWN AEROBIC AND ANAEROBIC Blood Culture adequate volume   Culture  Setup Time   Final    IN BOTH AEROBIC AND ANAEROBIC BOTTLES GRAM POSITIVE COCCI CRITICAL RESULT CALLED TO, READ BACK BY AND VERIFIED WITH: G. ABBOTT,PHARMD [id number] 08/11/2019 T. TYSOR    Culture   Final    CULTURE REINCUBATED FOR BETTER GROWTH Performed at [facility], [place] [address], [place], [place] [address]    Report Status PENDING  Incomplete  Blood Culture ID Panel (Reflexed)     Status: Abnormal   Collection Time: 08/10/19  4:30 AM  Result Value Ref Range Status   Enterococcus species DETECTED (A) NOT DETECTED Final    Comment: CRITICAL RESULT CALLED TO, READ BACK BY AND VERIFIED WITH: G. ABBOTT,PHARMD [id number] 08/11/2019 T. TYSOR    Vancomycin resistance NOT DETECTED NOT DETECTED Final   Listeria monocytogenes NOT DETECTED NOT DETECTED Final   Staphylococcus species NOT DETECTED NOT DETECTED Final   Staphylococcus aureus (BCID) NOT DETECTED NOT DETECTED Final   Streptococcus species NOT DETECTED NOT DETECTED Final   Streptococcus agalactiae NOT DETECTED NOT DETECTED Final   Streptococcus pneumoniae NOT  DETECTED NOT DETECTED Final   Streptococcus pyogenes NOT DETECTED NOT DETECTED Final   Acinetobacter baumannii NOT DETECTED NOT DETECTED Final   Enterobacteriaceae species NOT DETECTED NOT DETECTED Final   Enterobacter cloacae complex NOT DETECTED NOT DETECTED Final   Escherichia coli NOT DETECTED NOT DETECTED Final   Klebsiella oxytoca NOT DETECTED NOT  DETECTED Final   Klebsiella pneumoniae NOT DETECTED NOT DETECTED Final   Proteus species NOT DETECTED NOT DETECTED Final   Serratia marcescens NOT DETECTED NOT DETECTED Final   Haemophilus influenzae NOT DETECTED NOT DETECTED Final   Neisseria meningitidis NOT DETECTED NOT DETECTED Final   Pseudomonas aeruginosa NOT DETECTED NOT DETECTED Final   Candida albicans NOT DETECTED NOT DETECTED Final   Candida glabrata NOT DETECTED NOT DETECTED Final   Candida krusei NOT DETECTED NOT DETECTED Final   Candida parapsilosis NOT DETECTED NOT DETECTED Final   Candida tropicalis NOT DETECTED NOT DETECTED Final    Comment: Performed at [facility], [place] [address], [place], [place] [address]  Blood culture (routine x 2)     Status: None (Preliminary result)   Collection Time: 08/10/19  4:55 AM   Specimen: BLOOD RIGHT ARM  Result Value Ref Range Status   Specimen Description BLOOD RIGHT ARM  Final   Special Requests   Final    BOTTLES DRAWN AEROBIC ONLY Blood Culture adequate volume   Culture  Setup Time   Final    AEROBIC BOTTLE ONLY GRAM POSITIVE COCCI CRITICAL VALUE NOTED.  VALUE IS CONSISTENT WITH PREVIOUSLY REPORTED AND CALLED VALUE. Performed at [facility], [place] [address] [place], [place] [address]    Culture NO GROWTH 1 DAY  Final   Report Status PENDING  Incomplete  SARS CORONAVIRUS 2 (TAT 6-24 HRS) Nasopharyngeal Nasopharyngeal Swab     Status: None   Collection Time: 08/10/19  9:59 AM   Specimen: Nasopharyngeal Swab  Result Value Ref Range Status   SARS Coronavirus 2 NEGATIVE NEGATIVE Final    Comment:  (NOTE) SARS-CoV-2 target nucleic acids are NOT DETECTED. The SARS-CoV-2 RNA is generally detectable in upper and lower respiratory specimens during the acute phase of infection. Negative results do not preclude SARS-CoV-2 infection, do not rule out co-infections with other pathogens, and should not be used as the sole basis for treatment or other patient management decisions. Negative results must be combined with clinical observations, patient history, and epidemiological information. The expected result is Negative. Fact Sheet for Patients: SugarRoll.be Fact Sheet for Healthcare Providers: https://www.woods-mathews.com/ This test is not yet approved or cleared by the [place] FDA and  has been authorized for detection and/or diagnosis of SARS-CoV-2 by FDA under an Emergency Use Authorization (EUA). This EUA will remain  in effect (meaning this test can be used) for the duration of the COVID-19 declaration under Section 56 4(b)(1) of the Act, 21 U.S.C. section 360bbb-3(b)(1), unless the authorization is terminated or revoked sooner. Performed at [facility], [place] [address], [place], [place] [address]   Culture, blood (routine x 2)     Status: None (Preliminary result)   Collection Time: 08/11/19 10:36 AM   Specimen: BLOOD RIGHT HAND  Result Value Ref Range Status   Specimen Description BLOOD RIGHT HAND  Final   Special Requests   Final    BOTTLES DRAWN AEROBIC ONLY Blood Culture adequate volume Performed at [facility], [place] [address] [place], [place] [address]    Culture NO GROWTH <12 HOURS  Final   Report Status PENDING  Incomplete  Culture, blood (routine x 2)     Status: None (Preliminary result)   Collection Time: 08/11/19 10:39 AM   Specimen: BLOOD RIGHT HAND  Result Value Ref Range Status   Specimen Description BLOOD RIGHT HAND  Final   Special Requests   Final  BOTTLES DRAWN AEROBIC ONLY Blood Culture  results may not be optimal due to an inadequate volume of blood received in culture bottles Performed at [facility], [address], [place], [place] [address]    Culture NO GROWTH <12 HOURS  Final   Report Status PENDING  Incomplete    Radiology Studies: No results found.     T. Paragonah  If 7PM-7AM, please contact night-coverage [email or web address] Password [facility] 08/11/2019, 1:34 PM

## 2019-08-11 NOTE — Progress Notes (Signed)
Obtained consent form and placed in pt's chart.   Jaimy Kliethermes S Aviel Davalos, RN  

## 2019-08-11 NOTE — Consult Note (Signed)
[facility]    Date of Admission:  08/09/2019     Total days of antibiotics                Reason for Consult: Enterococcus Bacteremia   Referring Provider: Sharol Given Primary Care Provider: [name], MD   ASSESSMENT:  Mr. 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 Planned for below knee amputation today for achievement of source control. Repeat blood cultures to ensure clearance of bacteremia. TTE to rule out endocarditis. Will change antibiotics to vancomycin for use with dialysis. Will need at least 2 weeks of antibiotic therapy possibly longer if endocarditis is present.   PLAN:  1. Change ampicillin to vancomycin.  2. TTE to rule out endocarditis.  3. Repeat blood cultures for clearance of bacteremia  4. Below knee amputation today with orthopedics.   Principal Problem:   Bacteremia due to Enterococcus Active Problems:   PVD (peripheral vascular disease) (HCC)   Postoperative wound infection   Gangrene of left foot (HCC)   Prolonged QT interval   Acute metabolic encephalopathy   Thrombocytosis (HCC)   Hyperkalemia   Left foot infection   . atorvastatin  40 mg Oral Daily  . Chlorhexidine Gluconate Cloth  6 each Topical Q0600  . cinacalcet  60 mg Oral QPM  . heparin  5,000 Units Subcutaneous Q8H  . melatonin  3 mg Oral QHS  . midodrine  10 mg Oral Q M,W,F  . multivitamin  1 tablet Oral QHS  . saccharomyces boulardii  250 mg Oral BID  . senna-docusate  1 tablet Oral Daily  . sevelamer carbonate  3,200 mg Oral TID WC   And  . sevelamer carbonate  1,600 mg Oral With snacks     HPI: Savannah Erbe. is a 79 y.o. 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   Mr. Felter was recently admitted to the hospital from 07/21/19-07/22/19 with black gangrenous changes to his left foot s/p transmetatarsal amputation about 2 years prior complicated by a mechanical fall resulting in an avulsion off the greater trochanter. Dr. Sharol Given performed a left transmetatarsal amputation of the left foot on 3/26 without complication.   Mr. Delsanto was noted to have altered mental status in dialysis and noted to have poor wound healing. He was sent to the hospital for further evaluation. He is a resident at a skilled nursing facility. Afebrile in the ED with leukocytosis of 41. X-ray of the with post-operative changes following transmetatarsal amputation. CT head for altered mental status with no evidence of acute intracranial abnormality. Blood cultures obtained and started on broad spectrum antibiotics.  Mr. Corkins has been afebrile since admission with continued leukocytosis. Now with blood cultures positive for enterococcus species and pharmacy narrowing of antibiotics to ampicillin. Dr. Sharol Given has evaluated and noted cold gangrenous changes to the left foot and will need further amputation which is scheduled for today.  Review of Systems: Review of Systems  Constitutional: Negative for chills, fever and weight loss.  Respiratory: Negative for cough, shortness of breath and wheezing.   Cardiovascular: Negative for chest pain and leg swelling.  Gastrointestinal: Negative for abdominal pain, constipation, diarrhea, nausea and vomiting.  Skin: Negative for rash.  Past Medical History:  Diagnosis Date  . Anxiety   . Aortic stenosis   . Arthritis    left hand  . CVA (cerebral infarction)   . ESRD (end stage renal disease) on dialysis [facility])    M/W/F at [facility] [place]  . Essential hypertension    resolved with HD  . Gangrene of right foot ([place])   . Gastric ulcer 2004  . GI bleed    gastric ulcer  . Heart murmur   . History of blood transfusion   . History of  cardiomyopathy    LVEF normal as of February 2017  . History of gastric ulcer   . History of stroke    Left side weakness  . Iron deficiency anemia   . Osteomyelitis ([place])   . Peripheral vascular disease ([place])   . Stroke [facility]) 2000   limp left    Social History   Tobacco Use  . Smoking status: Former Smoker    Years: 25.00    Quit date: 04/29/2004    Years since quitting: 15.2  . Smokeless tobacco: Former Systems developer    Types: Chew    Quit date: 01/16/1987  . Tobacco comment: quit 2006  Substance Use Topics  . Alcohol use: No  . Drug use: No    Family History  Problem Relation Age of Onset  . Hypertension Mother   . Colon cancer Neg Hx   . Liver disease Neg Hx     Allergies  Allergen Reactions  . Aspirin Other (See Comments)    Causes internal bleeding  History of ulcers    OBJECTIVE: Blood pressure (!) 148/66, pulse 95, temperature 97.7 F (36.5 C), temperature source Oral, resp. rate (!) 22, height 5\' 5"  (1.651 m), weight 64 kg, SpO2 94 %.  Physical Exam Constitutional:      General: He is not in acute distress.    Appearance: He is well-developed.     Comments: Lying in bed with head of bed elevated; pleasant.   Cardiovascular:     Rate and Rhythm: Normal rate and regular rhythm.     Heart sounds: Murmur present.  Pulmonary:     Effort: Pulmonary effort is normal.     Breath sounds: Normal breath sounds.  Musculoskeletal:     Comments: Cold gangrene noted of left distal extremity s/p transmetatarsal amputation.   Skin:    General: Skin is warm and dry.  Neurological:     Mental Status: He is alert and oriented to person, place, and time.  Psychiatric:        Behavior: Behavior normal.        Thought Content: Thought content normal.        Judgment: Judgment normal.     Lab Results Lab Results  Component Value Date   WBC 38.6 (H) 08/11/2019   HGB 12.5 (L) 08/11/2019   HCT 38.0 (L) 08/11/2019   MCV 61.9 (L) 08/11/2019   PLT 380 08/11/2019    Lab  Results  Component Value Date   CREATININE 11.38 (H) 08/11/2019   BUN 77 (H) 08/11/2019   NA 131 (L) 08/11/2019   K 5.3 (H) 08/11/2019   CL 92 (L) 08/11/2019   CO2 22 08/11/2019    Lab Results  Component Value Date   ALT 21 08/10/2019   AST 68 (H) 08/10/2019   ALKPHOS 72 08/10/2019   BILITOT 0.9 08/10/2019     Microbiology: Recent Results (from the past 240 hour(s))  Blood culture (routine  x 2)     Status: None (Preliminary result)   Collection Time: 08/10/19  4:30 AM   Specimen: BLOOD LEFT ARM  Result Value Ref Range Status   Specimen Description BLOOD LEFT ARM  Final   Special Requests   Final    BOTTLES DRAWN AEROBIC AND ANAEROBIC Blood Culture adequate volume   Culture  Setup Time   Final    IN BOTH AEROBIC AND ANAEROBIC BOTTLES GRAM POSITIVE COCCI CRITICAL RESULT CALLED TO, READ BACK BY AND VERIFIED WITH: Salli Real [id number] 08/11/2019 Mena Goes Performed at [facility], [place] [address], [place], [place] [address]    Culture PENDING  Incomplete   Report Status PENDING  Incomplete  Blood Culture ID Panel (Reflexed)     Status: Abnormal   Collection Time: 08/10/19  4:30 AM  Result Value Ref Range Status   Enterococcus species DETECTED (A) NOT DETECTED Final    Comment: CRITICAL RESULT CALLED TO, READ BACK BY AND VERIFIED WITH: G. ABBOTT,PHARMD [id number] 08/11/2019 T. TYSOR    Vancomycin resistance NOT DETECTED NOT DETECTED Final   Listeria monocytogenes NOT DETECTED NOT DETECTED Final   Staphylococcus species NOT DETECTED NOT DETECTED Final   Staphylococcus aureus (BCID) NOT DETECTED NOT DETECTED Final   Streptococcus species NOT DETECTED NOT DETECTED Final   Streptococcus agalactiae NOT DETECTED NOT DETECTED Final   Streptococcus pneumoniae NOT DETECTED NOT DETECTED Final   Streptococcus pyogenes NOT DETECTED NOT DETECTED Final   Acinetobacter baumannii NOT DETECTED NOT DETECTED Final   Enterobacteriaceae species NOT DETECTED NOT DETECTED Final   Enterobacter  cloacae complex NOT DETECTED NOT DETECTED Final   Escherichia coli NOT DETECTED NOT DETECTED Final   Klebsiella oxytoca NOT DETECTED NOT DETECTED Final   Klebsiella pneumoniae NOT DETECTED NOT DETECTED Final   Proteus species NOT DETECTED NOT DETECTED Final   Serratia marcescens NOT DETECTED NOT DETECTED Final   Haemophilus influenzae NOT DETECTED NOT DETECTED Final   Neisseria meningitidis NOT DETECTED NOT DETECTED Final   Pseudomonas aeruginosa NOT DETECTED NOT DETECTED Final   Candida albicans NOT DETECTED NOT DETECTED Final   Candida glabrata NOT DETECTED NOT DETECTED Final   Candida krusei NOT DETECTED NOT DETECTED Final   Candida parapsilosis NOT DETECTED NOT DETECTED Final   Candida tropicalis NOT DETECTED NOT DETECTED Final    Comment: Performed at [facility], [address] [place], [place] [address]  Blood culture (routine x 2)     Status: None (Preliminary result)   Collection Time: 08/10/19  4:55 AM   Specimen: BLOOD RIGHT ARM  Result Value Ref Range Status   Specimen Description BLOOD RIGHT ARM  Final   Special Requests   Final    BOTTLES DRAWN AEROBIC ONLY Blood Culture adequate volume   Culture  Setup Time   Final    AEROBIC BOTTLE ONLY GRAM POSITIVE COCCI CRITICAL VALUE NOTED.  VALUE IS CONSISTENT WITH PREVIOUSLY REPORTED AND CALLED VALUE. Performed at [facility], [place] [address], [place], [place] [address]    Culture PENDING  Incomplete   Report Status PENDING  Incomplete  SARS CORONAVIRUS 2 (TAT 6-24 HRS) Nasopharyngeal Nasopharyngeal Swab     Status: None   Collection Time: 08/10/19  9:59 AM   Specimen: Nasopharyngeal Swab  Result Value Ref Range Status   SARS Coronavirus 2 NEGATIVE NEGATIVE Final    Comment: (NOTE) SARS-CoV-2 target nucleic acids are NOT DETECTED. The SARS-CoV-2 RNA is generally detectable in upper and lower respiratory specimens  during the acute phase of infection. Negative results do not preclude SARS-CoV-2 infection, do not  rule out co-infections with other pathogens, and should not be used as the sole basis for treatment or other patient management decisions. Negative results must be combined with clinical observations, patient history, and epidemiological information. The expected result is Negative. Fact Sheet for Patients: SugarRoll.be Fact Sheet for Healthcare Providers: https://www.woods-mathews.com/ This test is not yet approved or cleared by the [place] FDA and  has been authorized for detection and/or diagnosis of SARS-CoV-2 by FDA under an Emergency Use Authorization (EUA). This EUA will remain  in effect (meaning this test can be used) for the duration of the COVID-19 declaration under Section 56 4(b)(1) of the Act, 21 U.S.C. section 360bbb-3(b)(1), unless the authorization is terminated or revoked sooner. Performed at [facility], [place] [address], [place], [place] [address]      Terri Piedra, [facility]  08/11/2019  8:51 AM

## 2019-08-12 ENCOUNTER — Inpatient Hospital Stay (HOSPITAL_COMMUNITY): Payer: Medicare Other

## 2019-08-12 ENCOUNTER — Ambulatory Visit: Payer: Medicare Other | Admitting: Physician Assistant

## 2019-08-12 ENCOUNTER — Other Ambulatory Visit: Payer: Self-pay | Admitting: Physician Assistant

## 2019-08-12 DIAGNOSIS — B952 Enterococcus as the cause of diseases classified elsewhere: Secondary | ICD-10-CM | POA: Diagnosis not present

## 2019-08-12 DIAGNOSIS — I340 Nonrheumatic mitral (valve) insufficiency: Secondary | ICD-10-CM | POA: Diagnosis not present

## 2019-08-12 DIAGNOSIS — R7881 Bacteremia: Secondary | ICD-10-CM | POA: Diagnosis not present

## 2019-08-12 DIAGNOSIS — I350 Nonrheumatic aortic (valve) stenosis: Secondary | ICD-10-CM

## 2019-08-12 DIAGNOSIS — I96 Gangrene, not elsewhere classified: Secondary | ICD-10-CM | POA: Diagnosis not present

## 2019-08-12 DIAGNOSIS — R011 Cardiac murmur, unspecified: Secondary | ICD-10-CM | POA: Diagnosis not present

## 2019-08-12 LAB — CBC WITH DIFFERENTIAL/PLATELET
Abs Immature Granulocytes: 1.11 10*3/uL — ABNORMAL HIGH (ref 0.00–0.07)
Basophils Absolute: 0.1 10*3/uL (ref 0.0–0.1)
Basophils Relative: 0 %
Eosinophils Absolute: 0 10*3/uL (ref 0.0–0.5)
Eosinophils Relative: 0 %
HCT: 37.1 % — ABNORMAL LOW (ref 39.0–52.0)
Hemoglobin: 12.1 g/dL — ABNORMAL LOW (ref 13.0–17.0)
Immature Granulocytes: 3 %
Lymphocytes Relative: 3 %
Lymphs Abs: 1.3 10*3/uL (ref 0.7–4.0)
MCH: 20.2 pg — ABNORMAL LOW (ref 26.0–34.0)
MCHC: 32.6 g/dL (ref 30.0–36.0)
MCV: 61.9 fL — ABNORMAL LOW (ref 80.0–100.0)
Monocytes Absolute: 2.3 10*3/uL — ABNORMAL HIGH (ref 0.1–1.0)
Monocytes Relative: 6 %
Neutro Abs: 35.8 10*3/uL — ABNORMAL HIGH (ref 1.7–7.7)
Neutrophils Relative %: 88 %
Platelets: 415 10*3/uL — ABNORMAL HIGH (ref 150–400)
RBC: 5.99 MIL/uL — ABNORMAL HIGH (ref 4.22–5.81)
RDW: 18.6 % — ABNORMAL HIGH (ref 11.5–15.5)
WBC: 40.6 10*3/uL — ABNORMAL HIGH (ref 4.0–10.5)
nRBC: 0.1 % (ref 0.0–0.2)

## 2019-08-12 LAB — ECHOCARDIOGRAM COMPLETE
Height: 65 in
Weight: 2253.98 oz

## 2019-08-12 MED ORDER — DOXERCALCIFEROL 2.5 MCG PO CAPS
2.5000 ug | ORAL_CAPSULE | ORAL | Status: DC
  Administered 2019-08-13 – 2019-08-16 (×2): 2.5 ug via ORAL
  Filled 2019-08-12 (×4): qty 1

## 2019-08-12 NOTE — Progress Notes (Signed)
PROGRESS NOTE  FREDI GEILER Sr. EEF:[id number] DOB: 29-Dec-1940   PCP: [name], MD  Patient is from: Home  DOA: 08/09/2019 LOS: 2  Brief Narrative / Interim history: 79 year old male with history of ESRD on HD MWF, diastolic CHF, moderate AS, PVD, HTN, IDA and bilateral transmetatarsal amputation brought to ED due to altered mental status during dialysis.  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  Admitted for sepsis in the setting of postoperative left foot infection and found to have Enterococcus bacteremia.  ID, orthopedic surgery (Dr. Sharol Given) and nephrology following. ID recommended repeat blood culture and TTE to exclude endocarditis.  Orthopedic surgery recommended left transtibial amputation, which is scheduled for 4/16.  Subjective: Seen and examined earlier this morning.  No major events overnight of this morning.  No complaints.  He denies pain, shortness of breath, GI or UTI symptoms.  Objective: Vitals:   08/11/19 2300 08/12/19 0418 08/12/19 0421 08/12/19 0841  BP:  126/72  134/66  Pulse:  91  91  Resp:  19  19  Temp:  98.2 F (36.8 C)    TempSrc:  Oral    SpO2: 94% 94%  99%  Weight:   63.9 kg   Height:        Intake/Output Summary (Last 24 hours) at 08/12/2019 1137 Last data filed at 08/11/2019 2200 Gross per 24 hour  Intake 342.5 ml  Output 815 ml  Net -472.5 ml   Filed Weights   08/11/19 1200 08/11/19 1532 08/12/19 0421  Weight: 65 kg 63.6 kg 63.9 kg    Examination:  GENERAL: No acute distress.  Appears well.  HEENT: MMM.  Vision and hearing grossly intact.  NECK: Supple.  No apparent JVD.  RESP:  No IWOB. Good air movement bilaterally. CVS:  RRR.  2/6 SEM, more over [place] ABD/GI/GU: Bowel sounds present. Soft. Non tender.  MSK/EXT:  Moves extremities.  Bilateral transmetatarsal amputation.  Faint DP pulses bilaterally. SKIN: Dressing over left foot DCI.  Skin ulcer over plantar aspect of right foot.  See  pictures for more NEURO: Awake, alert and oriented x5 except place.  He thinks he is at [facility].  No apparent focal neuro deficit. PSYCH: Calm. Normal affect.     Procedures:  None  Microbiology summarized: 4/13-COVID-19 PCR negative. 4/13-blood cultures with GPC.  BICD with Enterococcus. 4/14-blood cultures NGTD  Assessment & Plan: Sepsis due to Enterococcus bacteremia  Postoperative left foot wound infection -ID and orthopedic surgery, Dr. Sharol Given following. -Still with significant leukocytosis.  CRP and ESR mildly elevated. -Repeat blood culture on 4/14 NGTD -Follow TTE -Continue vancomycin per ID. -Plan for transtibial amputation by Dr. Sharol Given on 4/16 -Trend leukocytosis.  Acute metabolic encephalopathy: Likely due to the above.  CT head without acute finding.  No focal neuro deficit.  Seems to have resolved.  He is fairly oriented today. Suspect secondary to infection as seen above. -Monitor sepsis/bacteremia/infection as above  ESRD on HD M/W/F at the [facility] HD center:  Azotemia/hyperkalemia/hyponatremia/Anion gap metabolic acidosis/BMD Anemia of renal disease -Nephrology managing  Hypotension: Normotensive -Continue midodrine on dialysis days  Peripheral vascular disease:  -Plavix on hold for anticipated surgery.  History of CVA in 2000: No apparent neuro deficit on exam. -Continue home medications -Plavix on hold for anticipated surgery.  Chronic diastolic CHF/NICM/moderate AS: Appears euvolemic.  No cardiopulmonary distress. -Fluid management by dialysis. -Strict intake and output -Daily weights  -Follow repeat echo.  Hyperlipidemia:  -Continue home atorvastatin  Thrombocytosis: Likely reactive from #1. -Continue monitoring  Debility -PT/OT                   DVT prophylaxis: Subcu heparin Code Status: Full code Family Communication: None at bedside.  Attempted to call patient's daughter but no answer.  Discharge barrier:  Enterococcus bacteremia/postoperative left foot infection on IV antibiotics and awaiting surgical intervention Patient is from: Home Final disposition: Likely home in the next 3 to 4 days once cleared by Ortho, ID and nephrology.  Consultants:  Orthopedic surgery, Dr. Sharol Given Infectious disease Nephrology   Sch Meds:  Scheduled Meds: . atorvastatin  40 mg Oral Daily  . Chlorhexidine Gluconate Cloth  6 each Topical Q0600  . cinacalcet  60 mg Oral QPM  . [START ON 08/13/2019] doxercalciferol  2.5 mcg Oral Q M,W,F-HD  . heparin  5,000 Units Subcutaneous Q8H  . melatonin  3 mg Oral QHS  . midodrine  10 mg Oral Q M,W,F  . multivitamin  1 tablet Oral QHS  . mupirocin ointment  1 application Nasal BID  . saccharomyces boulardii  250 mg Oral BID  . senna-docusate  1 tablet Oral Daily   Continuous Infusions: . vancomycin Stopped (08/11/19 1633)   PRN Meds:.acetaminophen **OR** acetaminophen, albuterol, oxyCODONE  Antimicrobials: Anti-infectives (From admission, onward)   Start     Dose/Rate Route Frequency Ordered Stop   08/11/19 1200  vancomycin (VANCOCIN) IVPB 750 mg/150 ml premix     750 mg 150 mL/hr over 60 Minutes Intravenous Every M-W-F (Hemodialysis) 08/11/19 0903     08/11/19 1000  ampicillin (OMNIPEN) 2 g in sodium chloride 0.9 % 100 mL IVPB  Status:  Discontinued     2 g 300 mL/hr over 20 Minutes Intravenous Every 12 hours 08/11/19 0405 08/11/19 0857   08/11/19 0915  vancomycin (VANCOREADY) IVPB 1250 mg/250 mL  Status:  Discontinued     1,250 mg 166.7 mL/hr over 90 Minutes Intravenous  Once 08/11/19 0903 08/11/19 0907   08/11/19 0915  ceFAZolin (ANCEF) IVPB 2g/100 mL premix     2 g 200 mL/hr over 30 Minutes Intravenous On call to O.R. 08/11/19 0909 08/12/19 0559   08/11/19 0000  vancomycin variable dose per unstable renal function (pharmacist dosing)  Status:  Discontinued      Does not apply See admin instructions 08/10/19 1108 08/11/19 0405   08/10/19 1600   piperacillin-tazobactam (ZOSYN) IVPB 2.25 g  Status:  Discontinued     2.25 g 100 mL/hr over 30 Minutes Intravenous Every 8 hours 08/10/19 1108 08/11/19 0405   08/10/19 1115  vancomycin (VANCOREADY) IVPB 500 mg/100 mL     500 mg 100 mL/hr over 60 Minutes Intravenous NOW 08/10/19 1105 08/10/19 1435   08/10/19 0745  piperacillin-tazobactam (ZOSYN) IVPB 3.375 g     3.375 g 100 mL/hr over 30 Minutes Intravenous  Once 08/10/19 0732 08/10/19 0935   08/10/19 0745  vancomycin (VANCOCIN) IVPB 1000 mg/200 mL premix     1,000 mg 200 mL/hr over 60 Minutes Intravenous  Once 08/10/19 0732 08/10/19 0959       I have personally reviewed the following labs and images: CBC: Recent Labs  Lab 08/09/19 2108 08/11/19 0306 08/11/19 1130 08/12/19 0518  WBC 41.0* 38.6* 38.1* 40.6*  NEUTROABS  --   --   --  35.8*  HGB 12.0* 12.5* 11.5* 12.1*  HCT 36.0* 38.0* 34.2* 37.1*  MCV 62.4* 61.9* 60.7* 61.9*  PLT 435* 380 362 415*   BMP &GFR Recent Labs  Lab 08/09/19 1630 08/10/19 1134 08/11/19 0306 08/11/19 1130 08/11/19 2021  NA 134* 133* 131* 132* 136  K 5.2* 4.9 5.3* 5.5* 3.7  CL 95* 93* 92* 92* 96*  CO2 23 25 22  20* 27  GLUCOSE 93 113* 70 70 97  BUN 59* 69* 77* 81* 27*  CREATININE 9.97* 10.75* 11.38* 11.57* 5.43*  CALCIUM 9.7 9.7 9.9 9.8 9.1  MG  --   --   --   --  1.9  PHOS  --  2.8 2.5  --   --    Estimated Creatinine Clearance: 10.1 mL/min (A) (by C-G formula based on SCr of 5.43 mg/dL (H)). Liver & Pancreas: Recent Labs  Lab 08/10/19 0822 08/10/19 1134 08/11/19 0306  AST 68*  --   --   ALT 21  --   --   ALKPHOS 72  --   --   BILITOT 0.9  --   --   PROT 7.9  --   --   ALBUMIN 2.1* 1.8* 1.9*   Recent Labs  Lab 08/10/19 0822  LIPASE 20   No results for input(s): AMMONIA in the last 168 hours. Diabetic: No results for input(s): HGBA1C in the last 72 hours. Recent Labs  Lab 08/10/19 0652  GLUCAP 114*   Cardiac Enzymes: No results for input(s): CKTOTAL, CKMB, CKMBINDEX,  TROPONINI in the last 168 hours. No results for input(s): PROBNP in the last 8760 hours. Coagulation Profile: No results for input(s): INR, PROTIME in the last 168 hours. Thyroid Function Tests: Recent Labs    08/11/19 2021  TSH 1.708   Lipid Profile: No results for input(s): CHOL, HDL, LDLCALC, TRIG, CHOLHDL, LDLDIRECT in the last 72 hours. Anemia Panel: No results for input(s): VITAMINB12, FOLATE, FERRITIN, TIBC, IRON, RETICCTPCT in the last 72 hours. Urine analysis: No results found for: COLORURINE, APPEARANCEUR, LABSPEC, PHURINE, GLUCOSEU, HGBUR, BILIRUBINUR, KETONESUR, PROTEINUR, UROBILINOGEN, NITRITE, LEUKOCYTESUR Sepsis Labs: Invalid input(s): PROCALCITONIN, [place]  Microbiology: Recent Results (from the past 240 hour(s))  Blood culture (routine x 2)     Status: None (Preliminary result)   Collection Time: 08/10/19  4:30 AM   Specimen: BLOOD LEFT ARM  Result Value Ref Range Status   Specimen Description BLOOD LEFT ARM  Final   Special Requests   Final    BOTTLES DRAWN AEROBIC AND ANAEROBIC Blood Culture adequate volume   Culture  Setup Time   Final    IN BOTH AEROBIC AND ANAEROBIC BOTTLES GRAM POSITIVE COCCI CRITICAL RESULT CALLED TO, READ BACK BY AND VERIFIED WITH: G. ABBOTT,PHARMD [id number] 08/11/2019 T. TYSOR    Culture   Final    GRAM POSITIVE COCCI IDENTIFICATION AND SUSCEPTIBILITIES TO FOLLOW Performed at [facility], [place] [address] [place], [place] [address]    Report Status PENDING  Incomplete  Blood Culture ID Panel (Reflexed)     Status: Abnormal   Collection Time: 08/10/19  4:30 AM  Result Value Ref Range Status   Enterococcus species DETECTED (A) NOT DETECTED Final    Comment: CRITICAL RESULT CALLED TO, READ BACK BY AND VERIFIED WITH: G. ABBOTT,PHARMD [id number] 08/11/2019 T. TYSOR    Vancomycin resistance NOT DETECTED NOT DETECTED Final   Listeria monocytogenes NOT DETECTED NOT DETECTED Final   Staphylococcus species NOT DETECTED NOT DETECTED Final    Staphylococcus aureus (BCID) NOT DETECTED NOT DETECTED Final   Streptococcus species NOT DETECTED NOT DETECTED Final   Streptococcus agalactiae NOT DETECTED NOT DETECTED Final   Streptococcus pneumoniae NOT DETECTED NOT DETECTED  Final   Streptococcus pyogenes NOT DETECTED NOT DETECTED Final   Acinetobacter baumannii NOT DETECTED NOT DETECTED Final   Enterobacteriaceae species NOT DETECTED NOT DETECTED Final   Enterobacter cloacae complex NOT DETECTED NOT DETECTED Final   Escherichia coli NOT DETECTED NOT DETECTED Final   Klebsiella oxytoca NOT DETECTED NOT DETECTED Final   Klebsiella pneumoniae NOT DETECTED NOT DETECTED Final   Proteus species NOT DETECTED NOT DETECTED Final   Serratia marcescens NOT DETECTED NOT DETECTED Final   Haemophilus influenzae NOT DETECTED NOT DETECTED Final   Neisseria meningitidis NOT DETECTED NOT DETECTED Final   Pseudomonas aeruginosa NOT DETECTED NOT DETECTED Final   Candida albicans NOT DETECTED NOT DETECTED Final   Candida glabrata NOT DETECTED NOT DETECTED Final   Candida krusei NOT DETECTED NOT DETECTED Final   Candida parapsilosis NOT DETECTED NOT DETECTED Final   Candida tropicalis NOT DETECTED NOT DETECTED Final    Comment: Performed at [facility], [place] [address], [place], [place] [address]  Blood culture (routine x 2)     Status: None (Preliminary result)   Collection Time: 08/10/19  4:55 AM   Specimen: BLOOD RIGHT ARM  Result Value Ref Range Status   Specimen Description BLOOD RIGHT ARM  Final   Special Requests   Final    BOTTLES DRAWN AEROBIC ONLY Blood Culture adequate volume   Culture  Setup Time   Final    AEROBIC BOTTLE ONLY GRAM POSITIVE COCCI CRITICAL VALUE NOTED.  VALUE IS CONSISTENT WITH PREVIOUSLY REPORTED AND CALLED VALUE.    Culture   Final    GRAM POSITIVE COCCI IDENTIFICATION AND SUSCEPTIBILITIES TO FOLLOW Performed at [facility], [place] [address], [place], [place] [address]    Report Status PENDING   Incomplete  SARS CORONAVIRUS 2 (TAT 6-24 HRS) Nasopharyngeal Nasopharyngeal Swab     Status: None   Collection Time: 08/10/19  9:59 AM   Specimen: Nasopharyngeal Swab  Result Value Ref Range Status   SARS Coronavirus 2 NEGATIVE NEGATIVE Final    Comment: (NOTE) SARS-CoV-2 target nucleic acids are NOT DETECTED. The SARS-CoV-2 RNA is generally detectable in upper and lower respiratory specimens during the acute phase of infection. Negative results do not preclude SARS-CoV-2 infection, do not rule out co-infections with other pathogens, and should not be used as the sole basis for treatment or other patient management decisions. Negative results must be combined with clinical observations, patient history, and epidemiological information. The expected result is Negative. Fact Sheet for Patients: SugarRoll.be Fact Sheet for Healthcare Providers: https://www.woods-mathews.com/ This test is not yet approved or cleared by the [place] FDA and  has been authorized for detection and/or diagnosis of SARS-CoV-2 by FDA under an Emergency Use Authorization (EUA). This EUA will remain  in effect (meaning this test can be used) for the duration of the COVID-19 declaration under Section 56 4(b)(1) of the Act, 21 U.S.C. section 360bbb-3(b)(1), unless the authorization is terminated or revoked sooner. Performed at [facility], [place] [address], [place], [place] [address]   Culture, blood (routine x 2)     Status: None (Preliminary result)   Collection Time: 08/11/19 10:36 AM   Specimen: BLOOD RIGHT HAND  Result Value Ref Range Status   Specimen Description BLOOD RIGHT HAND  Final   Special Requests   Final    BOTTLES DRAWN AEROBIC ONLY Blood Culture adequate volume Performed at [facility], [place] [address], [place], [place] [address]    Culture NO  GROWTH <12 HOURS  Final   Report Status PENDING  Incomplete  Culture, blood (routine x 2)      Status: None (Preliminary result)   Collection Time: 08/11/19 10:39 AM   Specimen: BLOOD RIGHT HAND  Result Value Ref Range Status   Specimen Description BLOOD RIGHT HAND  Final   Special Requests   Final    BOTTLES DRAWN AEROBIC ONLY Blood Culture results may not be optimal due to an inadequate volume of blood received in culture bottles Performed at [place] [address], [place], [place] [address]    Culture NO GROWTH <12 HOURS  Final   Report Status PENDING  Incomplete    Radiology Studies: No results found.    Earnest Thalman T. [place]  If 7PM-7AM, please contact night-coverage [email or web address] Password [facility] 08/12/2019, 11:37 AM

## 2019-08-12 NOTE — Progress Notes (Signed)
Patient lying in bed comfortable asking about where he will go after surgery.  VSS Wounds covered. Discussed with patient that he will need rehab or SNF. Plan for BKA this Friday at 730

## 2019-08-12 NOTE — Progress Notes (Signed)
  Echocardiogram 2D Echocardiogram has been performed.  Krystian Ferrentino A Trulee Hamstra 08/12/2019, 11:28 AM

## 2019-08-12 NOTE — Progress Notes (Signed)
Patient ID: [name]., male   DOB: [birth year], 79 y.o.   MRN: [id number]  [facility] Progress Note   Assessment/ Plan:   1.  Altered mental status/leukocytosis: Consistent with acute illness delirium likely from infected/nonhealing ulcer of the left TMA.  On broad-spectrum antibiotic coverage with plans noted for left below-knee amputation, he continues to have significant leukocytosis and I would recommend broadening antimicrobial coverage to include anaerobes. 2.  End-stage renal disease: Will get hemodialysis tomorrow to continue his usual outpatient Monday/Wednesday/Friday schedule.    Volume status and electrolytes within acceptable limits. 3.  Peripheral vascular disease status post left TMA: Evaluated earlier by Dr. Sharol Given and plans noted for left below-knee amputation likely tomorrow. 4.  Hypertension: Blood pressures currently at goal, continue midodrine. 5.  Secondary hyperparathyroidism: Phosphorus levels are marginally low, will discontinue sevelamer at this time.  Continue cinacalcet/[name]. 6.  Anemia of chronic illness: Hemoglobin and hematocrit currently at goal, will monitor for postoperative losses.  Subjective:   Without acute events overnight, very somnolent and not able to give me verbal responses.   Objective:   BP 134/66 (BP Location: Right Arm)   Pulse 91   Temp 98.2 F (36.8 C) (Oral)   Resp 19   Ht 5\' 5"  (1.651 m)   Wt 63.9 kg   SpO2 99%   BMI 23.44 kg/m   Physical Exam: Gen: Somnolent, awakens to touch but unable to respond to questions CVS: Pulse regular rhythm, normal rate, S1 and S2 normal Resp: Clear to auscultation bilaterally, no rales/rhonchi Abd: Soft, flat, nontender Ext: Left TMA stump with gangrenous changes, right TMA stump dusky.  Left thigh AV graft with palpable thrill.  Labs: BMET Recent Labs  Lab 08/09/19 1630 08/10/19 1134 08/11/19 0306 08/11/19 1130 08/11/19 2021  NA 134* 133* 131* 132* 136  K 5.2* 4.9 5.3*  5.5* 3.7  CL 95* 93* 92* 92* 96*  CO2 23 25 22  20* 27  GLUCOSE 93 113* 70 70 97  BUN 59* 69* 77* 81* 27*  CREATININE 9.97* 10.75* 11.38* 11.57* 5.43*  CALCIUM 9.7 9.7 9.9 9.8 9.1  PHOS  --  2.8 2.5  --   --    CBC Recent Labs  Lab 08/09/19 2108 08/11/19 0306 08/11/19 1130 08/12/19 0518  WBC 41.0* 38.6* 38.1* 40.6*  NEUTROABS  --   --   --  35.8*  HGB 12.0* 12.5* 11.5* 12.1*  HCT 36.0* 38.0* 34.2* 37.1*  MCV 62.4* 61.9* 60.7* 61.9*  PLT 435* 380 362 415*      Medications:    . atorvastatin  40 mg Oral Daily  . Chlorhexidine Gluconate Cloth  6 each Topical Q0600  . cinacalcet  60 mg Oral QPM  . heparin  5,000 Units Subcutaneous Q8H  . melatonin  3 mg Oral QHS  . midodrine  10 mg Oral Q M,W,F  . multivitamin  1 tablet Oral QHS  . mupirocin ointment  1 application Nasal BID  . saccharomyces boulardii  250 mg Oral BID  . senna-docusate  1 tablet Oral Daily  . sevelamer carbonate  3,200 mg Oral TID WC   And  . sevelamer carbonate  1,600 mg Oral With snacks   Elmarie Shiley, MD 08/12/2019, 9:52 AM

## 2019-08-12 NOTE — Progress Notes (Signed)
[facility]  Date of Admission:  08/09/2019     Total days of antibiotics 3         ASSESSMENT:  Mr. 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 Cultures drawn on 4/14 without growth in <24 hours. Awaiting TTE and will order TEE. Continue current dose of vancomycin.   PLAN:  1. Continue current dose of vancomycin. 2. Therapeutic drug monitoring of vancomycin levels.  3. BKA tomorrow for source control.  4.  Order TEE  Principal Problem:   Bacteremia due to Enterococcus Active Problems:   PVD (peripheral vascular disease) (HCC)   Postoperative wound infection   Gangrene of left foot (HCC)   Prolonged QT interval   Acute metabolic encephalopathy   Thrombocytosis (HCC)   Hyperkalemia   Left foot infection   . atorvastatin  40 mg Oral Daily  . Chlorhexidine Gluconate Cloth  6 each Topical Q0600  . cinacalcet  60 mg Oral QPM  . heparin  5,000 Units Subcutaneous Q8H  . melatonin  3 mg Oral QHS  . midodrine  10 mg Oral Q M,W,F  . multivitamin  1 tablet Oral QHS  . mupirocin ointment  1 application Nasal BID  . saccharomyces boulardii  250 mg Oral BID  . senna-docusate  1 tablet Oral Daily  . sevelamer carbonate  3,200 mg Oral TID WC   And  . sevelamer carbonate  1,600 mg Oral With snacks    SUBJECTIVE:  Afebrile overnight with no acute events. Awaiting BKA on 4/16. Continues to have leukocytosis. Receiving TTE during visit.   Allergies  Allergen Reactions  . Aspirin Other (See Comments)    Causes internal bleeding  History of ulcers     Review of Systems: Review of Systems  Constitutional: Negative for chills, fever and weight loss.  Respiratory: Negative for cough, shortness of breath and wheezing.   Cardiovascular: Negative for chest pain and leg swelling.  Gastrointestinal: Negative for abdominal pain, constipation, diarrhea, nausea and vomiting.  Skin:  Negative for rash.      OBJECTIVE: Vitals:   08/11/19 2300 08/12/19 0418 08/12/19 0421 08/12/19 0841  BP:  126/72  134/66  Pulse:  91  91  Resp:  19  19  Temp:  98.2 F (36.8 C)    TempSrc:  Oral    SpO2: 94% 94%  99%  Weight:   63.9 kg   Height:       Body mass index is 23.44 kg/m.  Physical Exam Constitutional:      General: He is not in acute distress.    Appearance: He is well-developed.  Cardiovascular:     Rate and Rhythm: Normal rate and regular rhythm.     Heart sounds: Murmur present.  Pulmonary:     Effort: Pulmonary effort is normal.     Breath sounds: Normal breath sounds.  Skin:    General: Skin is warm and dry.  Neurological:     Mental Status: He is alert and oriented to person, place, and time.  Psychiatric:        Mood and Affect: Mood normal.     Lab Results Lab Results  Component Value Date   WBC 40.6 (H) 08/12/2019   HGB 12.1 (L) 08/12/2019   HCT 37.1 (L) 08/12/2019   MCV 61.9 (L) 08/12/2019   PLT 415 (H) 08/12/2019    Lab Results  Component Value Date  CREATININE 5.43 (H) 08/11/2019   BUN 27 (H) 08/11/2019   NA 136 08/11/2019   K 3.7 08/11/2019   CL 96 (L) 08/11/2019   CO2 27 08/11/2019    Lab Results  Component Value Date   ALT 21 08/10/2019   AST 68 (H) 08/10/2019   ALKPHOS 72 08/10/2019   BILITOT 0.9 08/10/2019     Microbiology: Recent Results (from the past 240 hour(s))  Blood culture (routine x 2)     Status: None (Preliminary result)   Collection Time: 08/10/19  4:30 AM   Specimen: BLOOD LEFT ARM  Result Value Ref Range Status   Specimen Description BLOOD LEFT ARM  Final   Special Requests   Final    BOTTLES DRAWN AEROBIC AND ANAEROBIC Blood Culture adequate volume   Culture  Setup Time   Final    IN BOTH AEROBIC AND ANAEROBIC BOTTLES GRAM POSITIVE COCCI CRITICAL RESULT CALLED TO, READ BACK BY AND VERIFIED WITH: G. ABBOTT,PHARMD [id number] 08/11/2019 T. TYSOR    Culture   Final    GRAM POSITIVE COCCI IDENTIFICATION  AND SUSCEPTIBILITIES TO FOLLOW Performed at [facility], [place] [address], [place], [place] [address]    Report Status PENDING  Incomplete  Blood Culture ID Panel (Reflexed)     Status: Abnormal   Collection Time: 08/10/19  4:30 AM  Result Value Ref Range Status   Enterococcus species DETECTED (A) NOT DETECTED Final    Comment: CRITICAL RESULT CALLED TO, READ BACK BY AND VERIFIED WITH: G. ABBOTT,PHARMD [id number] 08/11/2019 T. TYSOR    Vancomycin resistance NOT DETECTED NOT DETECTED Final   Listeria monocytogenes NOT DETECTED NOT DETECTED Final   Staphylococcus species NOT DETECTED NOT DETECTED Final   Staphylococcus aureus (BCID) NOT DETECTED NOT DETECTED Final   Streptococcus species NOT DETECTED NOT DETECTED Final   Streptococcus agalactiae NOT DETECTED NOT DETECTED Final   Streptococcus pneumoniae NOT DETECTED NOT DETECTED Final   Streptococcus pyogenes NOT DETECTED NOT DETECTED Final   Acinetobacter baumannii NOT DETECTED NOT DETECTED Final   Enterobacteriaceae species NOT DETECTED NOT DETECTED Final   Enterobacter cloacae complex NOT DETECTED NOT DETECTED Final   Escherichia coli NOT DETECTED NOT DETECTED Final   Klebsiella oxytoca NOT DETECTED NOT DETECTED Final   Klebsiella pneumoniae NOT DETECTED NOT DETECTED Final   Proteus species NOT DETECTED NOT DETECTED Final   Serratia marcescens NOT DETECTED NOT DETECTED Final   Haemophilus influenzae NOT DETECTED NOT DETECTED Final   Neisseria meningitidis NOT DETECTED NOT DETECTED Final   Pseudomonas aeruginosa NOT DETECTED NOT DETECTED Final   Candida albicans NOT DETECTED NOT DETECTED Final   Candida glabrata NOT DETECTED NOT DETECTED Final   Candida krusei NOT DETECTED NOT DETECTED Final   Candida parapsilosis NOT DETECTED NOT DETECTED Final   Candida tropicalis NOT DETECTED NOT DETECTED Final    Comment: Performed at [facility], [address] [place], [place] [address]  Blood culture (routine x 2)     Status: None  (Preliminary result)   Collection Time: 08/10/19  4:55 AM   Specimen: BLOOD RIGHT ARM  Result Value Ref Range Status   Specimen Description BLOOD RIGHT ARM  Final   Special Requests   Final    BOTTLES DRAWN AEROBIC ONLY Blood Culture adequate volume   Culture  Setup Time   Final    AEROBIC BOTTLE ONLY GRAM POSITIVE COCCI CRITICAL VALUE NOTED.  VALUE IS CONSISTENT WITH PREVIOUSLY REPORTED AND CALLED VALUE.    Culture  Final    GRAM POSITIVE COCCI IDENTIFICATION AND SUSCEPTIBILITIES TO FOLLOW Performed at [facility], [place] [address] [place], [place] [address]    Report Status PENDING  Incomplete  SARS CORONAVIRUS 2 (TAT 6-24 HRS) Nasopharyngeal Nasopharyngeal Swab     Status: None   Collection Time: 08/10/19  9:59 AM   Specimen: Nasopharyngeal Swab  Result Value Ref Range Status   SARS Coronavirus 2 NEGATIVE NEGATIVE Final    Comment: (NOTE) SARS-CoV-2 target nucleic acids are NOT DETECTED. The SARS-CoV-2 RNA is generally detectable in upper and lower respiratory specimens during the acute phase of infection. Negative results do not preclude SARS-CoV-2 infection, do not rule out co-infections with other pathogens, and should not be used as the sole basis for treatment or other patient management decisions. Negative results must be combined with clinical observations, patient history, and epidemiological information. The expected result is Negative. Fact Sheet for Patients: SugarRoll.be Fact Sheet for Healthcare Providers: https://www.woods-mathews.com/ This test is not yet approved or cleared by the [place] FDA and  has been authorized for detection and/or diagnosis of SARS-CoV-2 by FDA under an Emergency Use Authorization (EUA). This EUA will remain  in effect (meaning this test can be used) for the duration of the COVID-19 declaration under Section 56 4(b)(1) of the Act, 21 U.S.C. section 360bbb-3(b)(1), unless the  authorization is terminated or revoked sooner. Performed at [facility], [place] [address], [place], [place] [address]   Culture, blood (routine x 2)     Status: None (Preliminary result)   Collection Time: 08/11/19 10:36 AM   Specimen: BLOOD RIGHT HAND  Result Value Ref Range Status   Specimen Description BLOOD RIGHT HAND  Final   Special Requests   Final    BOTTLES DRAWN AEROBIC ONLY Blood Culture adequate volume Performed at [facility], [place] [address], [place], [place] [address]    Culture NO GROWTH <12 HOURS  Final   Report Status PENDING  Incomplete  Culture, blood (routine x 2)     Status: None (Preliminary result)   Collection Time: 08/11/19 10:39 AM   Specimen: BLOOD RIGHT HAND  Result Value Ref Range Status   Specimen Description BLOOD RIGHT HAND  Final   Special Requests   Final    BOTTLES DRAWN AEROBIC ONLY Blood Culture results may not be optimal due to an inadequate volume of blood received in culture bottles Performed at [facility], [place] [address] [place], [place] [address]    Culture NO GROWTH <12 HOURS  Final   Report Status PENDING  Incomplete     Terri Piedra, NP [facility]  08/12/2019  10:53 AM

## 2019-08-12 NOTE — Progress Notes (Signed)
Pt noted communicating with unsee persons.  When asked he states he as does hear voices and sees people too.  They are his friends and states he has been there for a few years.  Denies any disturbing hallucinations.

## 2019-08-13 ENCOUNTER — Inpatient Hospital Stay (HOSPITAL_COMMUNITY): Payer: Medicare Other | Admitting: Certified Registered Nurse Anesthetist

## 2019-08-13 ENCOUNTER — Encounter (HOSPITAL_COMMUNITY)
Admission: EM | Disposition: A | Discharge status: Skilled Nursing Facility | Payer: Self-pay | Source: Home / Self Care | Attending: Student

## 2019-08-13 DIAGNOSIS — I350 Nonrheumatic aortic (valve) stenosis: Secondary | ICD-10-CM

## 2019-08-13 DIAGNOSIS — R7881 Bacteremia: Secondary | ICD-10-CM | POA: Diagnosis not present

## 2019-08-13 DIAGNOSIS — R011 Cardiac murmur, unspecified: Secondary | ICD-10-CM | POA: Diagnosis not present

## 2019-08-13 DIAGNOSIS — B952 Enterococcus as the cause of diseases classified elsewhere: Secondary | ICD-10-CM | POA: Diagnosis not present

## 2019-08-13 DIAGNOSIS — Z89422 Acquired absence of other left toe(s): Secondary | ICD-10-CM | POA: Diagnosis not present

## 2019-08-13 HISTORY — PX: AMPUTATION: SHX166

## 2019-08-13 LAB — CULTURE, BLOOD (ROUTINE X 2)
Special Requests: ADEQUATE
Special Requests: ADEQUATE

## 2019-08-13 LAB — CBC
HCT: 33.4 % — ABNORMAL LOW (ref 39.0–52.0)
Hemoglobin: 11.3 g/dL — ABNORMAL LOW (ref 13.0–17.0)
MCH: 21 pg — ABNORMAL LOW (ref 26.0–34.0)
MCHC: 33.8 g/dL (ref 30.0–36.0)
MCV: 62.1 fL — ABNORMAL LOW (ref 80.0–100.0)
Platelets: 463 10*3/uL — ABNORMAL HIGH (ref 150–400)
RBC: 5.38 MIL/uL (ref 4.22–5.81)
RDW: 18 % — ABNORMAL HIGH (ref 11.5–15.5)
WBC: 40.8 10*3/uL — ABNORMAL HIGH (ref 4.0–10.5)
nRBC: 0 % (ref 0.0–0.2)

## 2019-08-13 LAB — CBC WITH DIFFERENTIAL/PLATELET
Abs Immature Granulocytes: 1.12 10*3/uL — ABNORMAL HIGH (ref 0.00–0.07)
Basophils Absolute: 0.1 10*3/uL (ref 0.0–0.1)
Basophils Relative: 0 %
Eosinophils Absolute: 0 10*3/uL (ref 0.0–0.5)
Eosinophils Relative: 0 %
HCT: 34.5 % — ABNORMAL LOW (ref 39.0–52.0)
Hemoglobin: 11.3 g/dL — ABNORMAL LOW (ref 13.0–17.0)
Immature Granulocytes: 3 %
Lymphocytes Relative: 4 %
Lymphs Abs: 1.6 10*3/uL (ref 0.7–4.0)
MCH: 20.1 pg — ABNORMAL LOW (ref 26.0–34.0)
MCHC: 32.8 g/dL (ref 30.0–36.0)
MCV: 61.5 fL — ABNORMAL LOW (ref 80.0–100.0)
Monocytes Absolute: 1.9 10*3/uL — ABNORMAL HIGH (ref 0.1–1.0)
Monocytes Relative: 5 %
Neutro Abs: 34.2 10*3/uL — ABNORMAL HIGH (ref 1.7–7.7)
Neutrophils Relative %: 88 %
Platelets: 438 10*3/uL — ABNORMAL HIGH (ref 150–400)
RBC: 5.61 MIL/uL (ref 4.22–5.81)
RDW: 18.9 % — ABNORMAL HIGH (ref 11.5–15.5)
WBC: 39 10*3/uL — ABNORMAL HIGH (ref 4.0–10.5)
nRBC: 0.1 % (ref 0.0–0.2)

## 2019-08-13 LAB — BASIC METABOLIC PANEL
Anion gap: 15 (ref 5–15)
BUN: 47 mg/dL — ABNORMAL HIGH (ref 8–23)
CO2: 26 mmol/L (ref 22–32)
Calcium: 9.7 mg/dL (ref 8.9–10.3)
Chloride: 95 mmol/L — ABNORMAL LOW (ref 98–111)
Creatinine, Ser: 7.49 mg/dL — ABNORMAL HIGH (ref 0.61–1.24)
GFR calc Af Amer: 7 mL/min — ABNORMAL LOW (ref >60)
GFR calc non Af Amer: 6 mL/min — ABNORMAL LOW (ref >60)
Glucose, Bld: 76 mg/dL (ref 70–99)
Potassium: 3.9 mmol/L (ref 3.5–5.1)
Sodium: 136 mmol/L (ref 135–145)

## 2019-08-13 LAB — RENAL FUNCTION PANEL
Albumin: 1.8 g/dL — ABNORMAL LOW (ref 3.5–5.0)
Anion gap: 17 — ABNORMAL HIGH (ref 5–15)
BUN: 51 mg/dL — ABNORMAL HIGH (ref 8–23)
CO2: 22 mmol/L (ref 22–32)
Calcium: 9.3 mg/dL (ref 8.9–10.3)
Chloride: 96 mmol/L — ABNORMAL LOW (ref 98–111)
Creatinine, Ser: 7.91 mg/dL — ABNORMAL HIGH (ref 0.61–1.24)
GFR calc Af Amer: 7 mL/min — ABNORMAL LOW (ref >60)
GFR calc non Af Amer: 6 mL/min — ABNORMAL LOW (ref >60)
Glucose, Bld: 88 mg/dL (ref 70–99)
Phosphorus: 3 mg/dL (ref 2.5–4.6)
Potassium: 4.4 mmol/L (ref 3.5–5.1)
Sodium: 135 mmol/L (ref 135–145)

## 2019-08-13 LAB — MAGNESIUM: Magnesium: 2 mg/dL (ref 1.7–2.4)

## 2019-08-13 LAB — GLUCOSE, CAPILLARY: Glucose-Capillary: 86 mg/dL (ref 70–99)

## 2019-08-13 SURGERY — AMPUTATION BELOW KNEE
Anesthesia: General | Site: Knee | Laterality: Left

## 2019-08-13 MED ORDER — HYDROMORPHONE HCL 1 MG/ML IJ SOLN
INTRAMUSCULAR | Status: AC
  Filled 2019-08-13: qty 0.5

## 2019-08-13 MED ORDER — DOCUSATE SODIUM 100 MG PO CAPS
100.0000 mg | ORAL_CAPSULE | Freq: Two times a day (BID) | ORAL | Status: DC
  Administered 2019-08-13 – 2019-08-20 (×9): 100 mg via ORAL
  Filled 2019-08-13 (×10): qty 1

## 2019-08-13 MED ORDER — PHENYLEPHRINE HCL-NACL 10-0.9 MG/250ML-% IV SOLN
INTRAVENOUS | Status: DC | PRN
  Administered 2019-08-13: 25 ug/min via INTRAVENOUS

## 2019-08-13 MED ORDER — CEFAZOLIN SODIUM-DEXTROSE 2-3 GM-%(50ML) IV SOLR
INTRAVENOUS | Status: DC | PRN
  Administered 2019-08-13: 2 g via INTRAVENOUS

## 2019-08-13 MED ORDER — CEFAZOLIN SODIUM-DEXTROSE 2-4 GM/100ML-% IV SOLN
INTRAVENOUS | Status: AC
  Filled 2019-08-13: qty 100

## 2019-08-13 MED ORDER — ONDANSETRON HCL 4 MG/2ML IJ SOLN
INTRAMUSCULAR | Status: DC | PRN
  Administered 2019-08-13: 4 mg via INTRAVENOUS

## 2019-08-13 MED ORDER — OXYCODONE HCL 5 MG PO TABS
ORAL_TABLET | ORAL | Status: AC
  Filled 2019-08-13: qty 1

## 2019-08-13 MED ORDER — HYDROMORPHONE HCL 1 MG/ML IJ SOLN
0.5000 mg | INTRAMUSCULAR | Status: DC | PRN
  Administered 2019-08-14: 0.5 mg via INTRAVENOUS

## 2019-08-13 MED ORDER — CEFAZOLIN SODIUM-DEXTROSE 1-4 GM/50ML-% IV SOLN
1.0000 g | Freq: Once | INTRAVENOUS | Status: AC
  Administered 2019-08-14: 1 g via INTRAVENOUS
  Filled 2019-08-13: qty 50

## 2019-08-13 MED ORDER — 0.9 % SODIUM CHLORIDE (POUR BTL) OPTIME
TOPICAL | Status: DC | PRN
  Administered 2019-08-13: 1000 mL

## 2019-08-13 MED ORDER — PHENYLEPHRINE HCL (PRESSORS) 10 MG/ML IV SOLN
INTRAVENOUS | Status: DC | PRN
  Administered 2019-08-13 (×2): 80 ug via INTRAVENOUS

## 2019-08-13 MED ORDER — FENTANYL CITRATE (PF) 250 MCG/5ML IJ SOLN
INTRAMUSCULAR | Status: DC | PRN
  Administered 2019-08-13: 50 ug via INTRAVENOUS

## 2019-08-13 MED ORDER — LIDOCAINE 2% (20 MG/ML) 5 ML SYRINGE
INTRAMUSCULAR | Status: DC | PRN
  Administered 2019-08-13: 60 mg via INTRAVENOUS

## 2019-08-13 MED ORDER — FENTANYL CITRATE (PF) 250 MCG/5ML IJ SOLN
INTRAMUSCULAR | Status: AC
  Filled 2019-08-13: qty 5

## 2019-08-13 MED ORDER — PROPOFOL 10 MG/ML IV BOLUS
INTRAVENOUS | Status: DC | PRN
  Administered 2019-08-13 (×2): 100 mg via INTRAVENOUS

## 2019-08-13 MED ORDER — PROPOFOL 10 MG/ML IV BOLUS
INTRAVENOUS | Status: AC
  Filled 2019-08-13: qty 20

## 2019-08-13 MED ORDER — OXYCODONE HCL 5 MG/5ML PO SOLN: 5.0000 mg | Freq: Once | ORAL | Status: DC | PRN

## 2019-08-13 MED ORDER — SODIUM CHLORIDE 0.9 % IV SOLN: INTRAVENOUS | Status: DC | PRN

## 2019-08-13 MED ORDER — HYDROMORPHONE HCL 1 MG/ML IJ SOLN: 0.2500 mg | INTRAMUSCULAR | Status: DC | PRN

## 2019-08-13 MED ORDER — PHENYLEPHRINE HCL-NACL 10-0.9 MG/250ML-% IV SOLN: INTRAVENOUS | Status: DC | PRN

## 2019-08-13 MED ORDER — OXYCODONE HCL 5 MG PO TABS: 5.0000 mg | ORAL_TABLET | Freq: Once | ORAL | Status: DC | PRN

## 2019-08-13 MED ORDER — SODIUM CHLORIDE 0.9 % IV SOLN: INTRAVENOUS | Status: DC

## 2019-08-13 SURGICAL SUPPLY — 38 items
BLADE SAW RECIP 87.9 MT (BLADE) ×3 IMPLANT
BLADE SURG 21 STRL SS (BLADE) ×3 IMPLANT
BNDG COHESIVE 6X5 TAN STRL LF (GAUZE/BANDAGES/DRESSINGS) IMPLANT
CANISTER WOUND CARE 500ML ATS (WOUND CARE) ×3 IMPLANT
COVER SURGICAL LIGHT HANDLE (MISCELLANEOUS) ×3 IMPLANT
COVER WAND RF STERILE (DRAPES) IMPLANT
CUFF TOURN SGL QUICK 34 (TOURNIQUET CUFF) ×3
CUFF TRNQT CYL 34X4.125X (TOURNIQUET CUFF) ×1 IMPLANT
DRAPE INCISE IOBAN 66X45 STRL (DRAPES) ×3 IMPLANT
DRAPE U-SHAPE 47X51 STRL (DRAPES) ×3 IMPLANT
DRESSING PREVENA PLUS CUSTOM (GAUZE/BANDAGES/DRESSINGS) ×1 IMPLANT
DRSG PREVENA PLUS CUSTOM (GAUZE/BANDAGES/DRESSINGS) ×3
DURAPREP 26ML APPLICATOR (WOUND CARE) ×3 IMPLANT
ELECT REM PT RETURN 9FT ADLT (ELECTROSURGICAL) ×3
ELECTRODE REM PT RTRN 9FT ADLT (ELECTROSURGICAL) ×1 IMPLANT
GLOVE BIOGEL PI IND STRL 9 (GLOVE) ×1 IMPLANT
GLOVE BIOGEL PI INDICATOR 9 (GLOVE) ×2
GLOVE SURG ORTHO 9.0 STRL STRW (GLOVE) ×3 IMPLANT
GOWN STRL REUS W/ TWL XL LVL3 (GOWN DISPOSABLE) ×2 IMPLANT
GOWN STRL REUS W/TWL XL LVL3 (GOWN DISPOSABLE) ×6
KIT BASIN OR (CUSTOM PROCEDURE TRAY) ×3 IMPLANT
KIT TURNOVER KIT B (KITS) ×3 IMPLANT
MANIFOLD NEPTUNE II (INSTRUMENTS) ×3 IMPLANT
NS IRRIG 1000ML POUR BTL (IV SOLUTION) ×3 IMPLANT
PACK ORTHO EXTREMITY (CUSTOM PROCEDURE TRAY) ×3 IMPLANT
PAD ARMBOARD 7.5X6 YLW CONV (MISCELLANEOUS) ×3 IMPLANT
PREVENA RESTOR ARTHOFORM 46X30 (CANNISTER) ×3 IMPLANT
SPONGE LAP 18X18 RF (DISPOSABLE) IMPLANT
STAPLER VISISTAT 35W (STAPLE) IMPLANT
STOCKINETTE IMPERVIOUS LG (DRAPES) ×3 IMPLANT
SUT ETHILON 2 0 PSLX (SUTURE) IMPLANT
SUT SILK 2 0 (SUTURE) ×3
SUT SILK 2-0 18XBRD TIE 12 (SUTURE) ×1 IMPLANT
SUT VIC AB 1 CTX 27 (SUTURE) ×6 IMPLANT
TOWEL GREEN STERILE (TOWEL DISPOSABLE) ×3 IMPLANT
TUBE CONNECTING 12'X1/4 (SUCTIONS) ×1
TUBE CONNECTING 12X1/4 (SUCTIONS) ×2 IMPLANT
YANKAUER SUCT BULB TIP NO VENT (SUCTIONS) ×3 IMPLANT

## 2019-08-13 NOTE — Anesthesia Preprocedure Evaluation (Addendum)
Anesthesia Evaluation  Patient identified by MRN, date of birth, ID band Patient awake    Reviewed: Allergy & Precautions, NPO status , Patient's Chart, lab work & pertinent test results  Airway Mallampati: II  TM Distance: >3 FB Neck ROM: Full    Dental no notable dental hx. (+) Edentulous Lower, Edentulous Upper   Pulmonary former smoker,    Pulmonary exam normal breath sounds clear to auscultation       Cardiovascular hypertension, + Past MI and + Peripheral Vascular Disease  Normal cardiovascular exam+ Valvular Problems/Murmurs AS  Rhythm:Regular Rate:Normal  12/08/18 Echo  . The left ventricle has normal systolic function, with an ejection fraction of 55-60%. The cavity size was normal. There is moderately increased left ventricular wall thickness . Moderate to severe aortic stenosis. The highest gradient is noted with the pedhoff probe. Mean gradient 38 mmHg, dimensionless inex 0.28, AVA VTI 0.84.  MI 2000   Neuro/Psych Anxiety L sided weakness CVA, Residual Symptoms    GI/Hepatic   Endo/Other  negative endocrine ROS  Renal/GU ESRF and DialysisRenal diseaseMWF Dialysis     Musculoskeletal negative musculoskeletal ROS (+)   Abdominal   Peds  Hematology  (+) anemia , Hgb 13.4   Anesthesia Other Findings   Reproductive/Obstetrics                             Anesthesia Physical  Anesthesia Plan  ASA: IV  Anesthesia Plan: General   Post-op Pain Management:    Induction: Intravenous  PONV Risk Score and Plan: 2 and Treatment may vary due to age or medical condition, Ondansetron and Midazolam  Airway Management Planned: LMA  Additional Equipment: None  Intra-op Plan:   Post-operative Plan: Extubation in OR  Informed Consent: I have reviewed the patients History and Physical, chart, labs and discussed the procedure including the risks, benefits and alternatives for the  proposed anesthesia with the patient or authorized representative who has indicated his/her understanding and acceptance.     Dental advisory given  Plan Discussed with:   Anesthesia Plan Comments: (  PAT note written 07/20/2019 by Myra Gianotti, PA-C.  Echo 12/08/18: IMPRESSIONS 1. The left ventricle has normal systolic function, with an ejection fraction of 55-60%. The cavity size was normal. There is moderately increased left ventricular wall thickness. Left ventricular diastolic Doppler parameters are consistent with  impaired relaxation. Elevated mean left atrial pressure. 2. The right ventricle has normal systolic function. The cavity was normal. There is no increase in right ventricular wall thickness. 3. Left atrial size was severely dilated. 4. Mild thickening of the mitral valve leaflet. Mild calcification of the mitral valve leaflet. There is moderate mitral annular calcification present. No evidence of mitral valve stenosis. 5. The aortic valve has an indeterminate number of cusps. Moderate thickening of the aortic valve. Moderate calcification of the aortic valve. Aortic valve regurgitation is moderate by color flow Doppler. Moderate-severe stenosis of the aortic valve.  Moderate aortic annular calcification noted. 6. Moderate to severe aortic stenosis. The highest gradient is noted with the pedhoff probe. Mean gradient 38 mmHg, dimensionless inex 0.28, AVA VTI 0.84. 7. The aorta is normal in size and structure. 8. Pulmonary hypertension is indeterminate, inadequate TR jet. 9. The interatrial septum was not well visualized. (Comparison echo 06/27/15 showed moderate AS.Valve area (VTI): 0.89 cm^2. Valve area (Vmax):1.02 cm^2. Valve area (Vmean): 0.91 cm^2. Follow-up scheduled 08/24/19.)    Cardiac cath 07/11/15 (done for abnormal  06/27/15 stress test, EF 36%, apical-inferior perfusion defect, inferior hypokinesis): Prox LAD lesion, 45% stenosed. Mac w L Pop    There is moderate to severe left ventricular systolic dysfunction. -Nonischemic cardiomyopathy with global hypokinesis with more pronounced inferior hypocontractility and overall ejection fraction at 35%. -Mild coronary calcification with 40-50% eccentric proximal LAD stenosis just prior to a septal perforating artery; mild calcification of the proximal left circumflex coronary artery; and large dominant RCA without significant obstructive stenoses. -Probably at least mild aortic stenosis with LV to AO gradient ranging from 10 -18 mm Hg in this patient with reduced LV function; LVEDP 13 mm Hg.  RECOMMENDATION: Medical therapy for his cardiomyopathy and mild CAD. Consider a dobutamine stress echo if concern for low gradient AS with reduced LV function.  )       Anesthesia Quick Evaluation

## 2019-08-13 NOTE — Anesthesia Postprocedure Evaluation (Signed)
Anesthesia Post Note  Patient: Joseph MONCADA Sr.  Procedure(s) Performed: AMPUTATION BELOW KNEE (Left Knee)     Patient location during evaluation: PACU Anesthesia Type: General Level of consciousness: awake and alert Pain management: pain level controlled Vital Signs Assessment: post-procedure vital signs reviewed and stable Respiratory status: spontaneous breathing, nonlabored ventilation and respiratory function stable Cardiovascular status: blood pressure returned to baseline and stable Postop Assessment: no apparent nausea or vomiting Anesthetic complications: no    Last Vitals:  Vitals:   08/13/19 0925 08/13/19 1036  BP: (!) 113/53 (!) 115/45  Pulse: 92 (!) 112  Resp: 20   Temp: 36.9 C   SpO2: 100% 95%    Last Pain:  Vitals:   08/13/19 0925  TempSrc: Oral  PainSc:                  Lynda Rainwater

## 2019-08-13 NOTE — Op Note (Signed)
   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  PROCEDURE: Transtibial amputation Application of Prevena wound VAC  SURGEON: Sharol Given, M.D.  ANESTHESIA:  general  IV FLUIDS AND URINE: See anesthesia.  ESTIMATED BLOOD LOSS: Minimal mL.  COMPLICATIONS: None.  DESCRIPTION OF PROCEDURE: The patient was brought to the operating room and underwent a general anesthetic. After adequate levels of anesthesia were obtained patient's lower extremity was prepped using DuraPrep draped into a sterile field. A timeout was called. The foot was draped out of the sterile field with impervious stockinette. A transverse incision was made 11 cm distal to the tibial tubercle. This curved proximally and a large posterior flap was created. The tibia was transected 1 cm proximal to the skin incision. The fibula was transected just proximal to the tibial incision. The tibia was beveled anteriorly. A large posterior flap was created. The sciatic nerve was pulled cut and allowed to retract. The vascular bundles were suture ligated with 2-0 silk. The deep and superficial fascial layers were closed using #1 Vicryl. The skin was closed using staples and 2-0 nylon. The wound was covered with a Prevena wound VAC. There was a good suction fit. A prosthetic shrinker was applied. Patient was extubated taken to the PACU in stable condition.   DISCHARGE PLANNING:  Antibiotic duration: 24 hours  Weightbearing: Nonweightbearing on the left  Pain medication: Opioid pathway  Dressing care/ Wound VAC: Continue wound VAC for 1 week  Discharge to: Discharge to skilled nursing  facility  Follow-up: In the office 1 week post operative.  Meridee Score, MD [place] 8:33 AM

## 2019-08-13 NOTE — Plan of Care (Signed)
  Problem: Clinical Measurements: Goal: Ability to maintain clinical measurements within normal limits will improve Outcome: Progressing Goal: Will remain free from infection Outcome: Progressing Goal: Diagnostic test results will improve Outcome: Progressing Goal: Respiratory complications will improve Outcome: Progressing Goal: Cardiovascular complication will be avoided Outcome: Progressing   Problem: Activity: Goal: Risk for activity intolerance will decrease Outcome: Progressing   Problem: Nutrition: Goal: Adequate nutrition will be maintained Outcome: Progressing   Problem: Elimination: Goal: Will not experience complications related to bowel motility Outcome: Progressing Goal: Will not experience complications related to urinary retention Outcome: Progressing   Problem: Pain Managment: Goal: General experience of comfort will improve Outcome: Progressing   Problem: Safety: Goal: Ability to remain free from injury will improve Outcome: Progressing   Problem: Skin Integrity: Goal: Risk for impaired skin integrity will decrease Outcome: Progressing   

## 2019-08-13 NOTE — Progress Notes (Signed)
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 Blood culture from 4/14 without growth to date. Awaiting TEE on Monday to rule out endocarditis. Will continue current dose of vancomycin for enterococcus infection. Duration and and final recommendations pending TEE results. Family updated with plan of care.   Dr. Linus Salmons will be available as needed for any ID related questions over the weekend.   PLAN:  1. Continue current dose of vancomycin.  2. Wound care per orthopedics. 3. Final recommendations and duration pending TEE results.   Principal Problem:   Bacteremia due to Enterococcus Active Problems:   PVD (peripheral vascular disease) (HCC)   Postoperative wound infection   Gangrene of left foot (HCC)   Prolonged QT interval   Acute metabolic encephalopathy   Thrombocytosis (HCC)   Hyperkalemia   Left foot infection   . atorvastatin  40 mg Oral Daily  . Chlorhexidine Gluconate Cloth  6 each Topical Q0600  . cinacalcet  60 mg Oral QPM  . docusate sodium  100 mg Oral BID  . doxercalciferol  2.5 mcg Oral Q M,W,F-HD  . heparin  5,000 Units Subcutaneous Q8H  . melatonin  3 mg Oral QHS  . midodrine  10 mg Oral Q M,W,F  . multivitamin  1 tablet Oral QHS  . mupirocin ointment  1 application Nasal BID  . saccharomyces boulardii  250 mg Oral BID  . senna-docusate  1 tablet Oral Daily    SUBJECTIVE:  Afebrile overnight. OR this morning for transtibial amputation. Has some pain in his leg. Feeling tired. Family at beside.   Allergies  Allergen Reactions  . Aspirin Other (See Comments)    Causes internal bleeding  History of ulcers     Review of Systems: Review of Systems  Constitutional: Negative for chills, fever and weight loss.  Respiratory: Negative for cough, shortness of  breath and wheezing.   Cardiovascular: Negative for chest pain and leg swelling.  Gastrointestinal: Negative for abdominal pain, constipation, diarrhea, nausea and vomiting.  Skin: Negative for rash.      OBJECTIVE: Vitals:   08/13/19 0904 08/13/19 0925 08/13/19 1036 08/13/19 1229  BP: (!) 105/59 (!) 113/53 (!) 115/45 110/71  Pulse: 88 92 (!) 112 (!) 101  Resp: (!) 25 20  20   Temp: 98.3 F (36.8 C) 98.4 F (36.9 C)  97.9 F (36.6 C)  TempSrc:  Oral  Oral  SpO2: 100% 100% 95% 97%  Weight:      Height:       Body mass index is 24.18 kg/m.  Physical Exam Constitutional:      General: He is not in acute distress.    Appearance: He is well-developed.  Cardiovascular:     Rate and Rhythm: Normal rate and regular rhythm.     Heart sounds: Murmur present.  Pulmonary:     Effort: Pulmonary effort is normal.     Breath sounds: Normal breath sounds.  Musculoskeletal:     Comments: Surgical dressing on left lower extremity is clean and dry.   Skin:    General: Skin is warm and dry.  Neurological:     Mental Status: He is alert and oriented to person, place, and time.  Psychiatric:        Behavior: Behavior normal.  Thought Content: Thought content normal.        Judgment: Judgment normal.     Lab Results Lab Results  Component Value Date   WBC 40.8 (H) 08/13/2019   HGB 11.3 (L) 08/13/2019   HCT 33.4 (L) 08/13/2019   MCV 62.1 (L) 08/13/2019   PLT 463 (H) 08/13/2019    Lab Results  Component Value Date   CREATININE 7.91 (H) 08/13/2019   BUN 51 (H) 08/13/2019   NA 135 08/13/2019   K 4.4 08/13/2019   CL 96 (L) 08/13/2019   CO2 22 08/13/2019    Lab Results  Component Value Date   ALT 21 08/10/2019   AST 68 (H) 08/10/2019   ALKPHOS 72 08/10/2019   BILITOT 0.9 08/10/2019     Microbiology: Recent Results (from the past 240 hour(s))  Blood culture (routine x 2)     Status: Abnormal   Collection Time: 08/10/19  4:30 AM   Specimen: BLOOD LEFT ARM  Result  Value Ref Range Status   Specimen Description BLOOD LEFT ARM  Final   Special Requests   Final    BOTTLES DRAWN AEROBIC AND ANAEROBIC Blood Culture adequate volume   Culture  Setup Time   Final    IN BOTH AEROBIC AND ANAEROBIC BOTTLES GRAM POSITIVE COCCI CRITICAL RESULT CALLED TO, READ BACK BY AND VERIFIED WITH: G. ABBOTT,PHARMD [id number] 08/11/2019 T. TYSOR    Culture (A)  Final    ENTEROCOCCUS FAECALIS SUSCEPTIBILITIES PERFORMED ON PREVIOUS CULTURE WITHIN THE LAST 5 DAYS. Performed at [facility], [place] [address], [place], [place] [address]    Report Status 08/13/2019 FINAL  Final  Blood Culture ID Panel (Reflexed)     Status: Abnormal   Collection Time: 08/10/19  4:30 AM  Result Value Ref Range Status   Enterococcus species DETECTED (A) NOT DETECTED Final    Comment: CRITICAL RESULT CALLED TO, READ BACK BY AND VERIFIED WITH: G. ABBOTT,PHARMD [id number] 08/11/2019 T. TYSOR    Vancomycin resistance NOT DETECTED NOT DETECTED Final   Listeria monocytogenes NOT DETECTED NOT DETECTED Final   Staphylococcus species NOT DETECTED NOT DETECTED Final   Staphylococcus aureus (BCID) NOT DETECTED NOT DETECTED Final   Streptococcus species NOT DETECTED NOT DETECTED Final   Streptococcus agalactiae NOT DETECTED NOT DETECTED Final   Streptococcus pneumoniae NOT DETECTED NOT DETECTED Final   Streptococcus pyogenes NOT DETECTED NOT DETECTED Final   Acinetobacter baumannii NOT DETECTED NOT DETECTED Final   Enterobacteriaceae species NOT DETECTED NOT DETECTED Final   Enterobacter cloacae complex NOT DETECTED NOT DETECTED Final   Escherichia coli NOT DETECTED NOT DETECTED Final   Klebsiella oxytoca NOT DETECTED NOT DETECTED Final   Klebsiella pneumoniae NOT DETECTED NOT DETECTED Final   Proteus species NOT DETECTED NOT DETECTED Final   Serratia marcescens NOT DETECTED NOT DETECTED Final   Haemophilus influenzae NOT DETECTED NOT DETECTED Final   Neisseria meningitidis NOT DETECTED NOT DETECTED Final    Pseudomonas aeruginosa NOT DETECTED NOT DETECTED Final   Candida albicans NOT DETECTED NOT DETECTED Final   Candida glabrata NOT DETECTED NOT DETECTED Final   Candida krusei NOT DETECTED NOT DETECTED Final   Candida parapsilosis NOT DETECTED NOT DETECTED Final   Candida tropicalis NOT DETECTED NOT DETECTED Final    Comment: Performed at [facility], [address], [place], [place] [address]  Blood culture (routine x 2)     Status: Abnormal   Collection Time: 08/10/19  4:55 AM   Specimen: BLOOD RIGHT  ARM  Result Value Ref Range Status   Specimen Description BLOOD RIGHT ARM  Final   Special Requests   Final    BOTTLES DRAWN AEROBIC ONLY Blood Culture adequate volume   Culture  Setup Time   Final    AEROBIC BOTTLE ONLY GRAM POSITIVE COCCI CRITICAL VALUE NOTED.  VALUE IS CONSISTENT WITH PREVIOUSLY REPORTED AND CALLED VALUE. Performed at [facility], [place] [address], [place], [place] [address]    Culture ENTEROCOCCUS FAECALIS (A)  Final   Report Status 08/13/2019 FINAL  Final   Organism ID, Bacteria ENTEROCOCCUS FAECALIS  Final      Susceptibility   Enterococcus faecalis - MIC*    AMPICILLIN <=2 SENSITIVE Sensitive     VANCOMYCIN <=0.5 SENSITIVE Sensitive     GENTAMICIN SYNERGY SENSITIVE Sensitive     * ENTEROCOCCUS FAECALIS  SARS CORONAVIRUS 2 (TAT 6-24 HRS) Nasopharyngeal Nasopharyngeal Swab     Status: None   Collection Time: 08/10/19  9:59 AM   Specimen: Nasopharyngeal Swab  Result Value Ref Range Status   SARS Coronavirus 2 NEGATIVE NEGATIVE Final    Comment: (NOTE) SARS-CoV-2 target nucleic acids are NOT DETECTED. The SARS-CoV-2 RNA is generally detectable in upper and lower respiratory specimens during the acute phase of infection. Negative results do not preclude SARS-CoV-2 infection, do not rule out co-infections with other pathogens, and should not be used as the sole basis for treatment or other patient management decisions. Negative results must be  combined with clinical observations, patient history, and epidemiological information. The expected result is Negative. Fact Sheet for Patients: SugarRoll.be Fact Sheet for Healthcare Providers: https://www.woods-mathews.com/ This test is not yet approved or cleared by the [place] FDA and  has been authorized for detection and/or diagnosis of SARS-CoV-2 by FDA under an Emergency Use Authorization (EUA). This EUA will remain  in effect (meaning this test can be used) for the duration of the COVID-19 declaration under Section 56 4(b)(1) of the Act, 21 U.S.C. section 360bbb-3(b)(1), unless the authorization is terminated or revoked sooner. Performed at [facility], [place] [address], [place], [place] [address]   Culture, blood (routine x 2)     Status: None (Preliminary result)   Collection Time: 08/11/19 10:36 AM   Specimen: BLOOD RIGHT HAND  Result Value Ref Range Status   Specimen Description BLOOD RIGHT HAND  Final   Special Requests   Final    BOTTLES DRAWN AEROBIC ONLY Blood Culture adequate volume   Culture   Final    NO GROWTH 1 DAY Performed at [facility], [place] [address] [place], [place] [address]    Report Status PENDING  Incomplete  Culture, blood (routine x 2)     Status: None (Preliminary result)   Collection Time: 08/11/19 10:39 AM   Specimen: BLOOD RIGHT HAND  Result Value Ref Range Status   Specimen Description BLOOD RIGHT HAND  Final   Special Requests   Final    BOTTLES DRAWN AEROBIC ONLY Blood Culture results may not be optimal due to an inadequate volume of blood received in culture bottles   Culture   Final    NO GROWTH 1 DAY Performed at [facility], [place] [address] [place], [place] [address]    Report Status PENDING  Incomplete     Terri Piedra, [facility]  08/13/2019  12:42 PM

## 2019-08-13 NOTE — Progress Notes (Signed)
[facility] has 3 beats of Vtach bp was 104/50 this took place at 0156 patient was asleep not symptoms of chest pain or shortness of breath. New Orders received. Arthor Captain LPN

## 2019-08-13 NOTE — Progress Notes (Signed)
PROGRESS NOTE  [name]. WRU:[id number] DOB: [birth year]   PCP: [name], MD  Patient is from: Home  DOA: 08/09/2019 LOS: 3  Brief Narrative / Interim history: 79 year old male with history of ESRD on HD MWF, diastolic CHF, moderate AS, PVD, HTN, IDA and bilateral transmetatarsal amputation brought to ED due to altered mental status during dialysis.  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  Admitted for sepsis in the setting of postoperative left foot infection and found to have Enterococcus bacteremia.  ID, orthopedic surgery (Dr. Sharol Given) and nephrology following.  Repeat blood culture negative.  TTE without vegetation but severe aortic stenosis.  Cardiology consulted for TEE and evaluation of aortic stenosis.  Patient underwent left transtibial amputation by Dr. Sharol Given on 4/16.  Subjective: Seen and examined earlier this morning.  He had transtibial amputation this morning.  Reports "some pain".  He is somewhat drowsy from anesthesia.  Denies chest pain or dyspnea.  Daughter at bedside.  Objective: Vitals:   08/13/19 0903 08/13/19 0904 08/13/19 0925 08/13/19 1036  BP:  (!) 105/59 (!) 113/53 (!) 115/45  Pulse: 69 88 92 (!) 112  Resp: (!) 26 (!) 25 20   Temp:  98.3 F (36.8 C) 98.4 F (36.9 C)   TempSrc:   Oral   SpO2: 100% 100% 100% 95%  Weight:      Height:        Intake/Output Summary (Last 24 hours) at 08/13/2019 1221 Last data filed at 08/13/2019 0900 Gross per 24 hour  Intake 15 ml  Output 25 ml  Net -10 ml   Filed Weights   08/11/19 1532 08/12/19 0421 08/13/19 0411  Weight: 63.6 kg 63.9 kg 65.9 kg    Examination:  GENERAL: No acute distress.  Appears well.  HEENT: MMM.  Vision and hearing grossly intact.  NECK: Supple.  No apparent JVD.  RESP: On RA.  No IWOB.  Fair aeration bilaterally. CVS:  RRR.  2/6 SEM all over. ABD/GI/GU: Bowel sounds present. Soft. Non tender.  MSK/EXT:  Moves extremities.  Right  transmetatarsal amputation.  Wound VAC over left transtibial amputation.   SKIN: Wound VAC over left transtibial amputation NEURO: Somewhat drowsy but oriented fairly.  Follows commands.  No apparent focal neuro deficit. PSYCH: Calm. Normal affect.    Procedures:  4/16-left transtibial amputation with wound VAC placement  Microbiology summarized: 4/13-COVID-19 PCR negative. 4/13-blood cultures with GPC.  BICD with Enterococcus. 4/14-blood cultures NGTD  Assessment & Plan: Sepsis due to Enterococcus bacteremia  Postoperative left foot wound infection-patient had left transmetatarsal amputation on 3/24. -Underwent left transtibial amputation on 4/16 by Dr. Sharol Given. -Still with significant leukocytosis.  CRP and ESR mildly elevated. -Repeat blood culture on 4/14 NGTD -TTE without vegetation but severe aortic stenosis -Cardiology consulted for TEE and aortic stenosis -Continue vancomycin per ID. -Trend leukocytosis.  Acute metabolic encephalopathy: Likely due to the above.  CT head without acute finding.  No focal neuro deficit.  Seems to have resolved.  He is fairly oriented today. Suspect secondary to infection as seen above. -Monitor sepsis/bacteremia/infection as above  ESRD on HD M/W/F at [facility]:  Azotemia/hyperkalemia/hyponatremia/Anion gap metabolic acidosis/BMD Anemia of renal disease -Nephrology managing  Severe aortic stenosis/chronic diastolic CHF: No cardiopulmonary symptoms.  Appears euvolemic. -Cardiology consulted. -Fluid management by dialysis -Strict intake and output  Hypotension: Normotensive -Continue midodrine on dialysis days  Peripheral vascular disease:  -We will resume Plavix in the morning.  History of  CVA in 2000: No apparent neuro deficit on exam. -Continue home medications  Hyperlipidemia:  -Continue home atorvastatin  Bandemia/thrombocytosis: Likely reactive from #1. -Continue monitoring-I expect improvement after  surgical intervention.  Debility -PT/OT                   DVT prophylaxis: Subcu heparin Code Status: Full code Family Communication: Updated patient's daughter at bedside.  Discharge barrier: Evaluation and treatment of Enterococcus bacteremia/postoperative left foot infection on IV antibiotics Patient is from: Home Final disposition: Likely home in the next 3 to 4 days once cleared by Ortho, ID and nephrology.  Consultants:  Orthopedic surgery, Dr. Sharol Given Infectious disease Nephrology   Sch Meds:  Scheduled Meds: . atorvastatin  40 mg Oral Daily  . Chlorhexidine Gluconate Cloth  6 each Topical Q0600  . cinacalcet  60 mg Oral QPM  . docusate sodium  100 mg Oral BID  . doxercalciferol  2.5 mcg Oral Q M,W,F-HD  . heparin  5,000 Units Subcutaneous Q8H  . melatonin  3 mg Oral QHS  . midodrine  10 mg Oral Q M,W,F  . multivitamin  1 tablet Oral QHS  . mupirocin ointment  1 application Nasal BID  . saccharomyces boulardii  250 mg Oral BID  . senna-docusate  1 tablet Oral Daily   Continuous Infusions: . sodium chloride 75 mL/hr at 08/13/19 1043  . ceFAZolin    .  ceFAZolin (ANCEF) IV    . vancomycin Stopped (08/11/19 1633)   PRN Meds:.acetaminophen **OR** acetaminophen, albuterol, HYDROmorphone (DILAUDID) injection, oxyCODONE  Antimicrobials: Anti-infectives (From admission, onward)   Start     Dose/Rate Route Frequency Ordered Stop   08/13/19 2200  ceFAZolin (ANCEF) IVPB 1 g/50 mL premix     1 g 100 mL/hr over 30 Minutes Intravenous  Once 08/13/19 0947     08/13/19 0738  ceFAZolin (ANCEF) 2-4 GM/100ML-% IVPB    Note to Pharmacy: Clearnce Sorrel   : cabinet override      08/13/19 0738 08/13/19 1944   08/11/19 1200  vancomycin (VANCOCIN) IVPB 750 mg/150 ml premix     750 mg 150 mL/hr over 60 Minutes Intravenous Every M-W-F (Hemodialysis) 08/11/19 0903     08/11/19 1000  ampicillin (OMNIPEN) 2 g in sodium chloride 0.9 % 100 mL IVPB  Status:  Discontinued     2  g 300 mL/hr over 20 Minutes Intravenous Every 12 hours 08/11/19 0405 08/11/19 0857   08/11/19 0915  vancomycin (VANCOREADY) IVPB 1250 mg/250 mL  Status:  Discontinued     1,250 mg 166.7 mL/hr over 90 Minutes Intravenous  Once 08/11/19 0903 08/11/19 0907   08/11/19 0915  ceFAZolin (ANCEF) IVPB 2g/100 mL premix     2 g 200 mL/hr over 30 Minutes Intravenous On call to O.R. 08/11/19 0909 08/12/19 0559   08/11/19 0000  vancomycin variable dose per unstable renal function (pharmacist dosing)  Status:  Discontinued      Does not apply See admin instructions 08/10/19 1108 08/11/19 0405   08/10/19 1600  piperacillin-tazobactam (ZOSYN) IVPB 2.25 g  Status:  Discontinued     2.25 g 100 mL/hr over 30 Minutes Intravenous Every 8 hours 08/10/19 1108 08/11/19 0405   08/10/19 1115  vancomycin (VANCOREADY) IVPB 500 mg/100 mL     500 mg 100 mL/hr over 60 Minutes Intravenous NOW 08/10/19 1105 08/10/19 1435   08/10/19 0745  piperacillin-tazobactam (ZOSYN) IVPB 3.375 g     3.375 g 100 mL/hr over 30 Minutes Intravenous  Once  08/10/19 0732 08/10/19 0935   08/10/19 0745  vancomycin (VANCOCIN) IVPB 1000 mg/200 mL premix     1,000 mg 200 mL/hr over 60 Minutes Intravenous  Once 08/10/19 0732 08/10/19 0959       I have personally reviewed the following labs and images: CBC: Recent Labs  Lab 08/11/19 0306 08/11/19 1130 08/12/19 0518 08/13/19 0306 08/13/19 1020  WBC 38.6* 38.1* 40.6* 39.0* 40.8*  NEUTROABS  --   --  35.8* 34.2*  --   HGB 12.5* 11.5* 12.1* 11.3* 11.3*  HCT 38.0* 34.2* 37.1* 34.5* 33.4*  MCV 61.9* 60.7* 61.9* 61.5* 62.1*  PLT 380 362 415* 438* 463*   BMP &GFR Recent Labs  Lab 08/10/19 1134 08/10/19 1134 08/11/19 0306 08/11/19 1130 08/11/19 2021 08/13/19 0306 08/13/19 1020  NA 133*   < > 131* 132* 136 136 135  K 4.9   < > 5.3* 5.5* 3.7 3.9 4.4  CL 93*   < > 92* 92* 96* 95* 96*  CO2 25   < > 22 20* 27 26 22   GLUCOSE 113*   < > 70 70 97 76 88  BUN 69*   < > 77* 81* 27* 47* 51*   CREATININE 10.75*   < > 11.38* 11.57* 5.43* 7.49* 7.91*  CALCIUM 9.7   < > 9.9 9.8 9.1 9.7 9.3  MG  --   --   --   --  1.9 2.0  --   PHOS 2.8  --  2.5  --   --   --  3.0   < > = values in this interval not displayed.   Estimated Creatinine Clearance: 6.9 mL/min (A) (by C-G formula based on SCr of 7.91 mg/dL (H)). Liver & Pancreas: Recent Labs  Lab 08/10/19 0822 08/10/19 1134 08/11/19 0306 08/13/19 1020  AST 68*  --   --   --   ALT 21  --   --   --   ALKPHOS 72  --   --   --   BILITOT 0.9  --   --   --   PROT 7.9  --   --   --   ALBUMIN 2.1* 1.8* 1.9* 1.8*   Recent Labs  Lab 08/10/19 0822  LIPASE 20   No results for input(s): AMMONIA in the last 168 hours. Diabetic: No results for input(s): HGBA1C in the last 72 hours. Recent Labs  Lab 08/10/19 0652 08/13/19 0833  GLUCAP 114* 86   Cardiac Enzymes: No results for input(s): CKTOTAL, CKMB, CKMBINDEX, TROPONINI in the last 168 hours. No results for input(s): PROBNP in the last 8760 hours. Coagulation Profile: No results for input(s): INR, PROTIME in the last 168 hours. Thyroid Function Tests: Recent Labs    08/11/19 2021  TSH 1.708   Lipid Profile: No results for input(s): CHOL, HDL, LDLCALC, TRIG, CHOLHDL, LDLDIRECT in the last 72 hours. Anemia Panel: No results for input(s): VITAMINB12, FOLATE, FERRITIN, TIBC, IRON, RETICCTPCT in the last 72 hours. Urine analysis: No results found for: COLORURINE, APPEARANCEUR, LABSPEC, PHURINE, GLUCOSEU, HGBUR, BILIRUBINUR, KETONESUR, PROTEINUR, UROBILINOGEN, NITRITE, LEUKOCYTESUR Sepsis Labs: Invalid input(s): PROCALCITONIN, [place]  Microbiology: Recent Results (from the past 240 hour(s))  Blood culture (routine x 2)     Status: Abnormal   Collection Time: 08/10/19  4:30 AM   Specimen: BLOOD LEFT ARM  Result Value Ref Range Status   Specimen Description BLOOD LEFT ARM  Final   Special Requests   Final    BOTTLES DRAWN AEROBIC AND  ANAEROBIC Blood Culture adequate  volume   Culture  Setup Time   Final    IN BOTH AEROBIC AND ANAEROBIC BOTTLES GRAM POSITIVE COCCI CRITICAL RESULT CALLED TO, READ BACK BY AND VERIFIED WITH: G. ABBOTT,PHARMD [id number] 08/11/2019 T. TYSOR    Culture (A)  Final    ENTEROCOCCUS FAECALIS SUSCEPTIBILITIES PERFORMED ON PREVIOUS CULTURE WITHIN THE LAST 5 DAYS. Performed at [facility], [place] [address] [place], [place] [address]    Report Status 08/13/2019 FINAL  Final  Blood Culture ID Panel (Reflexed)     Status: Abnormal   Collection Time: 08/10/19  4:30 AM  Result Value Ref Range Status   Enterococcus species DETECTED (A) NOT DETECTED Final    Comment: CRITICAL RESULT CALLED TO, READ BACK BY AND VERIFIED WITH: G. ABBOTT,PHARMD [id number] 08/11/2019 T. TYSOR    Vancomycin resistance NOT DETECTED NOT DETECTED Final   Listeria monocytogenes NOT DETECTED NOT DETECTED Final   Staphylococcus species NOT DETECTED NOT DETECTED Final   Staphylococcus aureus (BCID) NOT DETECTED NOT DETECTED Final   Streptococcus species NOT DETECTED NOT DETECTED Final   Streptococcus agalactiae NOT DETECTED NOT DETECTED Final   Streptococcus pneumoniae NOT DETECTED NOT DETECTED Final   Streptococcus pyogenes NOT DETECTED NOT DETECTED Final   Acinetobacter baumannii NOT DETECTED NOT DETECTED Final   Enterobacteriaceae species NOT DETECTED NOT DETECTED Final   Enterobacter cloacae complex NOT DETECTED NOT DETECTED Final   Escherichia coli NOT DETECTED NOT DETECTED Final   Klebsiella oxytoca NOT DETECTED NOT DETECTED Final   Klebsiella pneumoniae NOT DETECTED NOT DETECTED Final   Proteus species NOT DETECTED NOT DETECTED Final   Serratia marcescens NOT DETECTED NOT DETECTED Final   Haemophilus influenzae NOT DETECTED NOT DETECTED Final   Neisseria meningitidis NOT DETECTED NOT DETECTED Final   Pseudomonas aeruginosa NOT DETECTED NOT DETECTED Final   Candida albicans NOT DETECTED NOT DETECTED Final   Candida glabrata NOT DETECTED NOT DETECTED Final     Candida krusei NOT DETECTED NOT DETECTED Final   Candida parapsilosis NOT DETECTED NOT DETECTED Final   Candida tropicalis NOT DETECTED NOT DETECTED Final    Comment: Performed at [facility], [address], [place], [place] [address]  Blood culture (routine x 2)     Status: Abnormal   Collection Time: 08/10/19  4:55 AM   Specimen: BLOOD RIGHT ARM  Result Value Ref Range Status   Specimen Description BLOOD RIGHT ARM  Final   Special Requests   Final    BOTTLES DRAWN AEROBIC ONLY Blood Culture adequate volume   Culture  Setup Time   Final    AEROBIC BOTTLE ONLY GRAM POSITIVE COCCI CRITICAL VALUE NOTED.  VALUE IS CONSISTENT WITH PREVIOUSLY REPORTED AND CALLED VALUE. Performed at [facility], [place] [address], [place], [place] [address]    Culture ENTEROCOCCUS FAECALIS (A)  Final   Report Status 08/13/2019 FINAL  Final   Organism ID, Bacteria ENTEROCOCCUS FAECALIS  Final      Susceptibility   Enterococcus faecalis - MIC*    AMPICILLIN <=2 SENSITIVE Sensitive     VANCOMYCIN <=0.5 SENSITIVE Sensitive     GENTAMICIN SYNERGY SENSITIVE Sensitive     * ENTEROCOCCUS FAECALIS  SARS CORONAVIRUS 2 (TAT 6-24 HRS) Nasopharyngeal Nasopharyngeal Swab     Status: None   Collection Time: 08/10/19  9:59 AM   Specimen: Nasopharyngeal Swab  Result Value Ref Range Status   SARS Coronavirus 2 NEGATIVE NEGATIVE Final    Comment: (NOTE) SARS-CoV-2  target nucleic acids are NOT DETECTED. The SARS-CoV-2 RNA is generally detectable in upper and lower respiratory specimens during the acute phase of infection. Negative results do not preclude SARS-CoV-2 infection, do not rule out co-infections with other pathogens, and should not be used as the sole basis for treatment or other patient management decisions. Negative results must be combined with clinical observations, patient history, and epidemiological information. The expected result is Negative. Fact Sheet for  Patients: SugarRoll.be Fact Sheet for Healthcare Providers: https://www.woods-mathews.com/ This test is not yet approved or cleared by the [place] FDA and  has been authorized for detection and/or diagnosis of SARS-CoV-2 by FDA under an Emergency Use Authorization (EUA). This EUA will remain  in effect (meaning this test can be used) for the duration of the COVID-19 declaration under Section 56 4(b)(1) of the Act, 21 U.S.C. section 360bbb-3(b)(1), unless the authorization is terminated or revoked sooner. Performed at [facility], [place] [address], [place], [place] [address]   Culture, blood (routine x 2)     Status: None (Preliminary result)   Collection Time: 08/11/19 10:36 AM   Specimen: BLOOD RIGHT HAND  Result Value Ref Range Status   Specimen Description BLOOD RIGHT HAND  Final   Special Requests   Final    BOTTLES DRAWN AEROBIC ONLY Blood Culture adequate volume   Culture   Final    NO GROWTH 1 DAY Performed at [facility], [place] [address], [place], [place] [address]    Report Status PENDING  Incomplete  Culture, blood (routine x 2)     Status: None (Preliminary result)   Collection Time: 08/11/19 10:39 AM   Specimen: BLOOD RIGHT HAND  Result Value Ref Range Status   Specimen Description BLOOD RIGHT HAND  Final   Special Requests   Final    BOTTLES DRAWN AEROBIC ONLY Blood Culture results may not be optimal due to an inadequate volume of blood received in culture bottles   Culture   Final    NO GROWTH 1 DAY Performed at [facility], [place] [address] [place], [place] [address]    Report Status PENDING  Incomplete    Radiology Studies: No results found.    Carmalita Wakefield T. [place]  If 7PM-7AM, please contact night-coverage [email or web address] Password [facility] 08/13/2019, 12:21 PM

## 2019-08-13 NOTE — Anesthesia Procedure Notes (Signed)
Procedure Name: LMA Insertion Date/Time: 08/13/2019 7:59 AM Performed by: Clearnce Sorrel, CRNA Pre-anesthesia Checklist: Patient identified, Emergency Drugs available, Suction available, Patient being monitored and Timeout performed Patient Re-evaluated:Patient Re-evaluated prior to induction Oxygen Delivery Method: Circle system utilized Preoxygenation: Pre-oxygenation with 100% oxygen Induction Type: IV induction Ventilation: Mask ventilation without difficulty LMA: LMA inserted LMA Size: 4.0 Number of attempts: 1 Placement Confirmation: positive ETCO2 and breath sounds checked- equal and bilateral Tube secured with: Tape Dental Injury: Teeth and Oropharynx as per pre-operative assessment

## 2019-08-13 NOTE — TOC Initial Note (Signed)
Transition of Care (TOC) - Initial/Assessment Note    Patient Details  Name: Joseph KUANG Sr. MRN: [id number] Date of Birth: [birth year]  Transition of Care [facility]) CM/SW Contact:    Vinie Sill, [name] Phone Number: 08/13/2019, 10:04 AM  Clinical Narrative:                  CSW visit with patient at bedside. CSW introduced self and explained role. Patient confirmed he was [facility]. Patient states he has been there for about 3-4 month. Patient states he anticipates and expects to return back to [facility] when medically ready for discharge. Patient states no questions or concerns at this time.  Thurmond Butts, MSW, [place] Clinical Social Worker   Expected Discharge Plan: [place]     Patient Goals and CMS Choice        Expected Discharge Plan and Services Expected Discharge Plan: [place]       Living arrangements for the past 2 months: [place]                                      Prior Living Arrangements/Services Living arrangements for the past 2 months: [place] Lives with:: Self Patient language and need for interpreter reviewed:: No        Need for Family Participation in Patient Care: Yes (Comment) Care giver support system in place?: Yes (comment)   Criminal Activity/Legal Involvement Pertinent to Current Situation/Hospitalization: No - Comment as needed  Activities of Daily Living Home Assistive Devices/Equipment: Gilford Rile (specify type) ADL Screening (condition at time of admission) Patient's cognitive ability adequate to safely complete daily activities?: No Is the patient deaf or have difficulty hearing?: No Does the patient have difficulty seeing, even when wearing glasses/contacts?: No Does the patient have difficulty concentrating, remembering, or making decisions?: Yes Patient able to express need for assistance with ADLs?: Yes Does the patient have difficulty  dressing or bathing?: Yes Independently performs ADLs?: No Communication: Independent Dressing (OT): Needs assistance Is this a change from baseline?: Pre-admission baseline Grooming: Needs assistance Is this a change from baseline?: Change from baseline, expected to last <3 days Feeding: Independent Bathing: Needs assistance Is this a change from baseline?: Pre-admission baseline Toileting: Needs assistance Is this a change from baseline?: Pre-admission baseline In/Out Bed: Needs assistance Is this a change from baseline?: Pre-admission baseline Walks in Home: Needs assistance Is this a change from baseline?: Pre-admission baseline Does the patient have difficulty walking or climbing stairs?: Yes Weakness of Legs: None Weakness of Arms/Hands: Both  Permission Sought/Granted Permission sought to share information with : Family Supports Permission granted to share information with : Yes, Verbal Permission Granted  Share Information with NAME: [name]  Permission granted to share info w AGENCY: SNFs  Permission granted to share info w Relationship: daughter  Permission granted to share info w Contact Information: [phone]  Emotional Assessment Appearance:: Appears stated age Attitude/Demeanor/Rapport: Engaged Affect (typically observed): Accepting, Appropriate Orientation: : Oriented to Self, Oriented to Place, Oriented to  Time, Oriented to Situation Alcohol / Substance Use: Not Applicable Psych Involvement: No (comment)  Admission diagnosis:  Postoperative wound infection [T81.49XA] Left foot infection [L08.9] Patient Active Problem List   Diagnosis Date Noted  . Bacteremia due to Enterococcus 08/11/2019  . Left foot infection   . Postoperative wound infection 08/10/2019  . Prolonged QT interval 08/10/2019  .  Acute metabolic encephalopathy 41/74/0814  . Thrombocytosis ([place]) 08/10/2019  . Hyperkalemia 08/10/2019  . Acute osteomyelitis of left foot ([place])  07/21/2019  . Fall   . Closed avulsion fracture of right hip ([place]) 06/30/2019  . Closed intertrochanteric fracture of right hip ([place]) 06/30/2019  . Hip fracture ([place]) 06/30/2019  . Severe protein-calorie malnutrition ([place]) 06/18/2018  . Severe sepsis ([place]) 06/12/2018  . Leukocytosis 06/12/2018  . MRSA (methicillin resistant staph aureus) culture positive 06/12/2018  . Essential hypertension 06/12/2018  . ESRD (end stage renal disease) ([place]) 06/09/2018  . Gangrene of left foot ([place]) 06/04/2018  . S/P transmetatarsal amputation of foot, right ([place]) 05/09/2017  . PVD (peripheral vascular disease) ([place]) 04/15/2017  . Nonischemic cardiomyopathy ([place]) 08/15/2015  . Aortic stenosis 08/15/2015  . Moderate aortic stenosis 07/12/2015  . 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 CAD- 40-50% LAD at cath 07/11/15 07/11/2015  . Aspirin intolerance 07/11/2015  . Abnormal stress test   . ESRD on dialysis ([place]) 05/30/2015  . CTS (carpal tunnel syndrome) 02/28/2015  . PVD of LE - Dr Oneida Alar follows 08/20/2012  . Midfoot skin ulcer, right, limited to breakdown of skin ([place]) 01/16/2012  . Encounter for screening colonoscopy 10/08/2011  . Melena 10/08/2011  . Other complications due to renal dialysis device, implant, and graft 09/24/2011   PCP:  [name], MD Pharmacy:   [place], [place] [place] [address] Phone: [phone] Fax: [phone]  [facility] (ESRD Bundle Only) - [place], [address] [place] [address] Phone: [phone] Fax: [phone]     Social Determinants of Health (SDOH) Interventions    Readmission Risk Interventions No flowsheet data found.

## 2019-08-13 NOTE — Progress Notes (Signed)
Orthopedic Tech Progress Note Patient Details:  [name] 1940-07-14 [id number] Called in order to [facility] for a VIVE PROTOCOL BK. Patient ID: [name]., male   DOB: 11/09/1940, 79 y.o.   MRN: [id number]   [name] 08/13/2019, 10:11 AM

## 2019-08-13 NOTE — Progress Notes (Signed)
Pt transferred to hd via bed

## 2019-08-13 NOTE — Transfer of Care (Signed)
Immediate Anesthesia Transfer of Care Note  Patient: [name].  Procedure(s) Performed: AMPUTATION BELOW KNEE (Left Knee)  Patient Location: PACU  Anesthesia Type:General  Level of Consciousness: awake  Airway & Oxygen Therapy: Patient Spontanous Breathing and Patient connected to face mask oxygen  Post-op Assessment: Report given to RN and Post -op Vital signs reviewed and stable  Post vital signs: Reviewed and stable  Last Vitals:  Vitals Value Taken Time  BP 141/114 08/13/19 0830  Temp    Pulse 93 08/13/19 0832  Resp 27 08/13/19 0832  SpO2 100 % 08/13/19 0832  Vitals shown include unvalidated device data.  Last Pain:  Vitals:   08/13/19 0411  TempSrc: Oral  PainSc:          Complications: No apparent anesthesia complications

## 2019-08-13 NOTE — Progress Notes (Signed)
PHARMACY NOTE:  ANTIMICROBIAL RENAL DOSAGE ADJUSTMENT  Current antimicrobial regimen includes a mismatch between antimicrobial dosage and estimated renal function.  As per policy approved by the Pharmacy & Therapeutics and Medical Executive Committees, the antimicrobial dosage will be adjusted accordingly.  Current antimicrobial dosage:  Ancef 1gm IV q6h x3  Indication: surgical prophylaxis  Renal Function:  Estimated Creatinine Clearance: 7.3 mL/min (A) (by C-G formula based on SCr of 7.49 mg/dL (H)). [x]      On intermittent HD, scheduled: []      On CRRT    Antimicrobial dosage has been changed to:  Ancef 1gm IV x1 at 10pm  Additional comments:   Thank you for allowing pharmacy to be a part of this patient's care.  Hildred Laser, PharmD Clinical Pharmacist **Pharmacist phone directory can now be found on [email or web address] ([email or web address]).  Listed under [place].

## 2019-08-13 NOTE — Progress Notes (Signed)
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 L BKA this AM . [facility] following for enteroccoccal bacteremia . No interval issues   No intake/output data recorded.  Filed Weights   08/11/19 1532 08/12/19 0421 08/13/19 0411  Weight: 63.6 kg 63.9 kg 65.9 kg    Scheduled Meds: . atorvastatin  40 mg Oral Daily  . Chlorhexidine Gluconate Cloth  6 each Topical Q0600  . cinacalcet  60 mg Oral QPM  . docusate sodium  100 mg Oral BID  . doxercalciferol  2.5 mcg Oral Q M,W,F-HD  . heparin  5,000 Units Subcutaneous Q8H  . melatonin  3 mg Oral QHS  . midodrine  10 mg Oral Q M,W,F  . multivitamin  1 tablet Oral QHS  . mupirocin ointment  1 application Nasal BID  . saccharomyces boulardii  250 mg Oral BID  . senna-docusate  1 tablet Oral Daily   Continuous Infusions: . sodium chloride    . ceFAZolin    .  ceFAZolin (ANCEF) IV    . vancomycin Stopped (08/11/19 1633)   PRN Meds:.acetaminophen **OR** acetaminophen, albuterol, HYDROmorphone (DILAUDID) injection, oxyCODONE  Current Labs: reviewed    Physical Exam:  Blood pressure (!) 115/45, pulse (!) 112, temperature 98.4 F (36.9 C), temperature source Oral, resp. rate 20, height 5\' 5"  (1.651 m), weight 65.9 kg, SpO2 95 %. chronicall ill apparing LLE AVG +B/T Regular nl s1s2 S/nt/nd L BKA site with wound vac CTAB Mild confusion  Dialysis prescription: Monday/Wednesday/Friday, 4 hours, BFR 400/DFR 800, 2K/2.5 calcium, heparin 6000 units, left femoral loop graft, EDW 66 kg.  [name] mcg 3 times weekly, Sensipar 90 mg daily.  A 1. ESRD MWF [facility]  2. 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 AMS likely from #2 and #4 4. Enterococcus bacteremia 5. HTN 6. PVD 7. Hx/o CVA 8. Chronic dCHF 9. 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 HD today on schedule . Medication Issues; o Preferred narcotic agents for pain  control are hydromorphone, fentanyl, and methadone. Morphine should not be used.  o Baclofen should be avoided o Avoid oral sodium phosphate and magnesium citrate based laxatives / bowel preps    Pearson Grippe MD 08/13/2019, 10:39 AM  Recent Labs  Lab 08/10/19 1134 08/10/19 1134 08/11/19 0306 08/11/19 0306 08/11/19 1130 08/11/19 2021 08/13/19 0306  NA 133*   < > 131*   < > 132* 136 136  K 4.9   < > 5.3*   < > 5.5* 3.7 3.9  CL 93*   < > 92*   < > 92* 96* 95*  CO2 25   < > 22   < > 20* 27 26  GLUCOSE 113*   < > 70   < > 70 97 76  BUN 69*   < > 77*   < > 81* 27* 47*  CREATININE 10.75*   < > 11.38*   < > 11.57* 5.43* 7.49*  CALCIUM 9.7   < > 9.9   < > 9.8 9.1 9.7  PHOS 2.8  --  2.5  --   --   --   --    < > = values in this interval not displayed.   Recent Labs  Lab 08/11/19 1130 08/12/19 0518 08/13/19 0306  WBC 38.1* 40.6* 39.0*  NEUTROABS  --  35.8* 34.2*  HGB 11.5* 12.1* 11.3*  HCT 34.2* 37.1* 34.5*  MCV 60.7* 61.9* 61.5*  PLT 362 415* 438*

## 2019-08-13 NOTE — Consult Note (Addendum)
Cardiology Consultation:   Patient ID: [name].; [id number]; 10-07-1940   Admit date: 08/09/2019 Date of Consult: 08/13/2019  Primary Care Provider: [name], MD Primary Cardiologist: Rozann Lesches, MD 11/25/2018 Primary Electrophysiologist:  None   Patient Profile:   Joseph Maryland Sr. is a 79 y.o. male with a hx of ESRD on HD MWF, HTN, PAD, CVA, D-CHF, non-healing wound, moderate AS, who is being seen today for the evaluation of severe AS at the request of Dr Cyndia Skeeters.  History of Present Illness:   [name] was admitted 4/13 with confusion.  He had a transmetatarsal amputation on the left 03/24, but it was noted that the wound on his foot was not healing at dialysis.  He was sent to the hospital.    His lactic acid was 2.8, WBCs 41.  He was admitted for sepsis and started on Vanco and Zosyn.  Dr. Sharol Given was consulted and felt amputation of the foot was the best option.  His volume status was managed by Nephrology with dialysis.  Infectious Disease was consulted to manage the antibiotics.  Echocardiogram was performed, aortic stenosis is now severe and cardiology was asked to evaluate.  [name] is awake and alert.  His left transtibial amputation was this morning.  He tolerated the procedure well.  He is having pain in his leg, but no other complaints.  His daughter is with him in the room.  When he was first admitted, he was having problems with confusion.  Now, he is oriented to name and place and recognizes his daughter, naming her accurately.  He has not had any chest pain.  He is compliant with dialysis appointments, but denies any new dyspnea on exertion, orthopnea, or PND.  He had dialysis on Wednesday, and is supposed to have it again later today.  He is having frequent PVCs on telemetry, but is not having any palpitations.  He has no history of syncope or presyncope.  Other than the pain in his leg, he has no complaints.   Past Medical History:  Diagnosis  Date  . Anxiety   . Aortic stenosis   . Arthritis    left hand  . CVA (cerebral infarction)   . ESRD (end stage renal disease) on dialysis [facility])    M/W/F at [facility] in [place]  . Essential hypertension    resolved with HD  . Gangrene of right foot ([place])   . Gastric ulcer 2004  . GI bleed    gastric ulcer  . Heart murmur   . History of blood transfusion   . History of cardiomyopathy    LVEF normal as of February 2017  . History of gastric ulcer   . History of stroke    Left side weakness  . Iron deficiency anemia   . Osteomyelitis ([place])   . Peripheral vascular disease ([place])   . Stroke [facility]) 2000   limp left    Past Surgical History:  Procedure Laterality Date  . ABDOMINAL AORTAGRAM N/A 01/24/2012   Procedure: ABDOMINAL Maxcine Ham;  Surgeon: Elam Dutch, MD;  Location: [facility] CATH LAB;  Service: Cardiovascular;  Laterality: N/A;  . ABDOMINAL AORTOGRAM W/LOWER EXTREMITY N/A 04/15/2017   Procedure: ABDOMINAL AORTOGRAM W/LOWER EXTREMITY;  Surgeon: Angelia Mould, MD;  Location: [place] CV LAB;  Service: Cardiovascular;  Laterality: N/A;  . AMPUTATION Right 05/09/2017   Procedure: RIGHT TRANSMETATARSAL AMPUTATION;  Surgeon: [name], MD;  Location: [place];  Service: Orthopedics;  Laterality: Right;  .  AMPUTATION Right 06/13/2018   Procedure: AMPUTATION RIGHT LONG FINGER;  Surgeon: Dayna Barker, MD;  Location: [place];  Service: Plastics;  Laterality: Right;  . AMPUTATION Left 07/21/2019   Procedure: LEFT TRANSMETATARSAL AMPUTATION;  Surgeon: [name], MD;  Location: [place];  Service: Orthopedics;  Laterality: Left;  . ARTERIOVENOUS GRAFT PLACEMENT Right right arm  . AV FISTULA PLACEMENT Left 08/31/2015   Procedure: ARTERIOVENOUS (AV) FISTULA CREATION- LEFT RADIOCEPHALIC;  Surgeon: Mal Misty, MD;  Location: [place];  Service: Vascular;  Laterality: Left;  . AV FISTULA PLACEMENT Right 02/18/2017   Procedure: INSERTION OF ARTERIOVENOUS (AV) GORE-TEX GRAFT   RIGHT UPPER ARM;  Surgeon: Conrad Logan, MD;  Location: [place];  Service: Vascular;  Laterality: Right;  . AV FISTULA PLACEMENT Left 12/01/2018   Procedure: INSERTION OF ARTERIOVENOUS (AV) GORE-TEX GRAFT ARM;  Surgeon: Angelia Mould, MD;  Location: [place];  Service: Vascular;  Laterality: Left;  . AV FISTULA PLACEMENT Left 02/09/2019   Procedure: INSERTION OF ARTERIOVENOUS (AV) GORE-TEX GRAFT LEFT THIGH;  Surgeon: Waynetta Sandy, MD;  Location: [place];  Service: Vascular;  Laterality: Left;  . BASCILIC VEIN TRANSPOSITION Left 12/19/2015   Procedure: FIRST STAGE BRACHIAL VEIN TRANSPOSITION;  Surgeon: Conrad Strang, MD;  Location: [place];  Service: Vascular;  Laterality: Left;  . BASCILIC VEIN TRANSPOSITION Left 02/08/2016   Procedure: SECOND STAGE BRACHIAL VEIN TRANSPOSITION;  Surgeon: Conrad St. Stephens, MD;  Location: [place];  Service: Vascular;  Laterality: Left;  . CARDIAC CATHETERIZATION N/A 07/11/2015   Procedure: Left Heart Cath and Coronary Angiography;  Surgeon: Troy Sine, MD;  Location: [place] CV LAB;  Service: Cardiovascular;  Laterality: N/A;  . Carpel Tunnel Left Dec. 22, 2016  . CHOLECYSTECTOMY    . COLONOSCOPY  2004   Dr. Irving Shows, left sided diverticula and cecal polyp, path unknown  . COLONOSCOPY  10/29/2011   Procedure: COLONOSCOPY;  Surgeon: Daneil Dolin, MD;  [facility];  Service: Endoscopy;  Laterality: N/A;  10:15  . ESOPHAGOGASTRODUODENOSCOPY  11/2002   Dr. Gala Romney, erosive reflux esophagitis, multiple gastric ulcer and antral/bulbar erosions. Serologies positive for H.Pylori and was treated  . ESOPHAGOGASTRODUODENOSCOPY  11/20014   Dr. Gala Romney, small hh only, ulcers healed  . ESOPHAGOGASTRODUODENOSCOPY  09/21/2011   Dr Trevor Iha [facility], antral erosions, ?early GAVE  . FISTULOGRAM Left 12/10/2016   Procedure: THROMBECTOMY OF LEFT ARM ARTERIOVENOUS FISTULA;  Surgeon: Waynetta Sandy, MD;  Location: [place];  Service: Vascular;  Laterality:  Left;  . INSERTION OF DIALYSIS CATHETER Left 12/10/2016   Procedure: INSERTION OF TUNNELED DIALYSIS CATHETER;  Surgeon: Waynetta Sandy, MD;  Location: [place];  Service: Vascular;  Laterality: Left;  . INSERTION OF DIALYSIS CATHETER Right 06/09/2018   Procedure: INSERTION OF DIALYSIS CATHETER Right subclavian;  Surgeon: Angelia Mould, MD;  Location: [place];  Service: Vascular;  Laterality: Right;  . IR DIALY SHUNT INTRO [place] W/IMG RIGHT Right 01/01/2018  . IR GENERIC HISTORICAL  07/16/2016   IR REMOVAL TUN CV CATH W/O FL 07/16/2016 Saverio Danker, PA-C [facility]-INTERV RAD  . IR PTA ADDL CENTRAL DIALYSIS SEG THRU DIALY CIRCUIT RIGHT Right 10/21/2017  . IR REMOVAL TUN CV CATH W/O FL  05/12/2017  . IR THROMBECTOMY AV FISTULA W/THROMBOLYSIS/PTA INC/SHUNT/IMG RIGHT Right 10/21/2017  . IR THROMBECTOMY AV FISTULA W/THROMBOLYSIS/PTA INC/SHUNT/IMG RIGHT Right 11/27/2017  . IR US GUIDE VASC ACCESS RIGHT  10/21/2017  . IR US GUIDE VASC ACCESS RIGHT  11/27/2017  .  IR US GUIDE VASC ACCESS RIGHT  01/01/2018  . LIGATION ARTERIOVENOUS GORTEX GRAFT Right 06/09/2018  . LIGATION ARTERIOVENOUS GORTEX GRAFT Right 06/09/2018   Procedure: LIGATION ARTERIOVENOUS GORTEX GRAFT RIGHT ARM;  Surgeon: Angelia Mould, MD;  Location: [place];  Service: Vascular;  Laterality: Right;  . LIGATION ARTERIOVENOUS GORTEX GRAFT Left 12/31/2018   Procedure: LIGATION OF LEFT UPPER ARM ARTERIVENOUS GORTEX GRAFT, LEFT BRACHIAL ARTERY ENDARTERECTOMY WITH BOVINE PATCH ANGIOPLASTY;  Surgeon: Waynetta Sandy, MD;  Location: [place];  Service: Vascular;  Laterality: Left;  . LIGATION OF ARTERIOVENOUS  FISTULA Left 12/19/2015   Procedure: LIGATION OF RADIOCEPHALIC ARTERIOVENOUS  FISTULA;  Surgeon: Conrad Malakoff, MD;  Location: [place];  Service: Vascular;  Laterality: Left;  Marland Kitchen MASS EXCISION Right 02/18/2017   Procedure: EXCISION OF RIGHT AXILLARY EPIDERMAL INCLUSION CYST;  Surgeon: Conrad [place], MD;  Location: [place];   Service: Vascular;  Laterality: Right;  . PERIPHERAL VASCULAR CATHETERIZATION N/A 12/14/2015   Procedure: Fistulagram;  Surgeon: Conrad Ballou, MD;  Location: [place] CV LAB;  Service: Cardiovascular;  Laterality: N/A;  . SHOULDER SURGERY Right    fracture  . ULTRASOUND GUIDANCE FOR VASCULAR ACCESS  04/15/2017   Procedure: Ultrasound Guidance For Vascular Access;  Surgeon: Angelia Mould, MD;  Location: [place] CV LAB;  Service: Cardiovascular;;  . UPPER EXTREMITY VENOGRAPHY Bilateral 12/17/2016   Procedure: Bilateral Upper Extremity Venography;  Surgeon: Serafina Mitchell, MD;  Location: [place] CV LAB;  Service: Cardiovascular;  Laterality: Bilateral;  . UPPER EXTREMITY VENOGRAPHY Left 11/03/2018   Procedure: UPPER EXTREMITY VENOGRAPHY;  Surgeon: Serafina Mitchell, MD;  Location: [place] CV LAB;  Service: Cardiovascular;  Laterality: Left;     Prior to Admission medications   Medication Sig Start Date End Date Taking? Authorizing Provider  acetaminophen (TYLENOL) 325 MG tablet Take 1-2 tablets (325-650 mg total) by mouth every 6 (six) hours as needed for mild pain (pain score 1-3 or temp > 100.5). Patient taking differently: Take 650 mg by mouth every 6 (six) hours as needed for mild pain (pain score 1-3 or temp > 100.5).  07/22/19  Yes Persons, Bevely Palmer, PA  Amino Acids-Protein Hydrolys (FEEDING SUPPLEMENT, PRO-STAT SUGAR FREE 64,) LIQD Take 30 mLs by mouth in the morning and at bedtime.   Yes [provider]  atorvastatin (LIPITOR) 40 MG tablet Take 40 mg by mouth daily. 11/03/15  Yes [provider]  b complex-vitamin c-folic acid (NEPHRO-VITE) 0.8 MG TABS Take 1 tablet by mouth See admin instructions. Take one tablet daily on Tuesdays, Thursdays, Saturdays, and Sundays.   Yes [provider]  cholecalciferol (VITAMIN D3) 25 MCG (1000 UT) tablet Take 1,000 Units by mouth daily.   Yes [provider]  cinacalcet (SENSIPAR) 60 MG tablet Take  60 mg by mouth every evening.    Yes [provider]  clopidogrel (PLAVIX) 75 MG tablet Take 75 mg by mouth daily. 05/24/19  Yes [provider]  doxycycline (VIBRAMYCIN) 100 MG capsule Take 100 mg by mouth 2 (two) times daily.   Yes [provider]  melatonin 3 MG TABS tablet Take 3 mg by mouth at bedtime.   Yes [provider]  midodrine (PROAMATINE) 10 MG tablet Take 10 mg by mouth every Monday, Wednesday, and Friday. before dialysis. 09/30/18  Yes [provider]  Nutritional Supplements (FEEDING SUPPLEMENT, NEPRO CARB STEADY,) LIQD Take 237 mLs by mouth 2 (two) times daily between meals. 07/22/19  Yes Persons,  Bevely Palmer, PA  oxyCODONE (OXY IR/ROXICODONE) 5 MG immediate release tablet Take 1-2 tablets (5-10 mg total) by mouth every 4 (four) hours as needed for moderate pain (pain score 4-6). 07/22/19  Yes Persons, Bevely Palmer, PA  saccharomyces boulardii (FLORASTOR) 250 MG capsule Take 250 mg by mouth 2 (two) times daily.   Yes [provider]  sennosides-docusate sodium (SENOKOT-S) 8.6-50 MG tablet Take 1 tablet by mouth daily.   Yes [provider]  sevelamer (RENVELA) 800 MG tablet Take 1,600-3,200 mg by mouth See admin instructions. Take 3200  mg by mouth 3 times daily with full meals and take 1600 mg by mouth with snacks.   Yes [provider]  vitamin C (ASCORBIC ACID) 500 MG tablet Take 500 mg by mouth daily.   Yes [provider]    Inpatient Medications: Scheduled Meds: . atorvastatin  40 mg Oral Daily  . Chlorhexidine Gluconate Cloth  6 each Topical Q0600  . cinacalcet  60 mg Oral QPM  . docusate sodium  100 mg Oral BID  . doxercalciferol  2.5 mcg Oral Q M,W,F-HD  . heparin  5,000 Units Subcutaneous Q8H  . melatonin  3 mg Oral QHS  . midodrine  10 mg Oral Q M,W,F  . multivitamin  1 tablet Oral QHS  . mupirocin ointment  1 application Nasal BID  . saccharomyces boulardii  250 mg Oral BID  . senna-docusate   1 tablet Oral Daily   Continuous Infusions: . sodium chloride 75 mL/hr at 08/13/19 1043  . ceFAZolin    .  ceFAZolin (ANCEF) IV    . vancomycin Stopped (08/11/19 1633)   PRN Meds: acetaminophen **OR** acetaminophen, albuterol, HYDROmorphone (DILAUDID) injection, oxyCODONE  Allergies:    Allergies  Allergen Reactions  . Aspirin Other (See Comments)    Causes internal bleeding  History of ulcers    Social History:   Social History   Socioeconomic History  . Marital status: Single    Spouse name: Not on file  . Number of children: 2  . Years of education: Not on file  . Highest education level: Not on file  Occupational History  . Occupation: retired, Licensed conveyancer    Employer: RETIRED  Tobacco Use  . Smoking status: Former Smoker    Years: 25.00    Quit date: 04/29/2004    Years since quitting: 15.2  . Smokeless tobacco: Former Systems developer    Types: Chew    Quit date: 01/16/1987  . Tobacco comment: quit 2006  Substance and Sexual Activity  . Alcohol use: No  . Drug use: No  . Sexual activity: Not on file  Other Topics Concern  . Not on file  Social History Narrative   Lives alone   Daughter 20-min away   Caffeine use: 32oz soda per day   Social Determinants of Health   Financial Resource Strain:   . Difficulty of Paying Living Expenses:   Food Insecurity:   . Worried About Charity fundraiser in the Last Year:   . Arboriculturist in the Last Year:   Transportation Needs:   . Film/video editor (Medical):   Marland Kitchen Lack of Transportation (Non-Medical):   Physical Activity:   . Days of Exercise per Week:   . Minutes of Exercise per Session:   Stress:   . Feeling of Stress :   Social Connections:   . Frequency of Communication with Friends and Family:   . Frequency of Social Gatherings with Friends  and Family:   . Attends Religious Services:   . Active Member of Clubs or Organizations:   . Attends Archivist Meetings:   Marland Kitchen Marital Status:   Intimate  Partner Violence:   . Fear of Current or Ex-Partner:   . Emotionally Abused:   Marland Kitchen Physically Abused:   . Sexually Abused:     Family History:   Family History  Problem Relation Age of Onset  . Hypertension Mother   . Colon cancer Neg Hx   . Liver disease Neg Hx    Family Status:  Family Status  Relation Name Status  . Mother  Deceased  . Father  Deceased  . Neg Hx  (Not Specified)    ROS:  Please see the history of present illness.  All other ROS reviewed and negative.     Physical Exam/Data:   Vitals:   08/13/19 0903 08/13/19 0904 08/13/19 0925 08/13/19 1036  BP:  (!) 105/59 (!) 113/53 (!) 115/45  Pulse: 69 88 92 (!) 112  Resp: (!) 26 (!) 25 20   Temp:  98.3 F (36.8 C) 98.4 F (36.9 C)   TempSrc:   Oral   SpO2: 100% 100% 100% 95%  Weight:      Height:        Intake/Output Summary (Last 24 hours) at 08/13/2019 1146 Last data filed at 08/13/2019 0900 Gross per 24 hour  Intake 15 ml  Output 25 ml  Net -10 ml    Last 3 Weights 08/13/2019 08/12/2019 08/11/2019  Weight (lbs) 145 lb 4.5 oz 140 lb 14 oz 140 lb 3.4 oz  Weight (kg) 65.9 kg 63.9 kg 63.6 kg     Body mass index is 24.18 kg/m.   General:  Well nourished, frail elderly, male in no acute distress HEENT: normal Lymph: no adenopathy Neck: JVD -9 cm Endocrine:  No thryomegaly Vascular: No carotid bruits; 4/4 extremity pulses 2+  Cardiac:  normal S1, S2; RRR; 2/6 murmur Lungs: Rales bases bilaterally, no wheezing, rhonchi    Abd: soft, nontender, no hepatomegaly  Ext: no edema, LLE s/p transtibial amputation, wound dressed and not disturbed. RLE w/ good popliteal pulse, pedal pulses not felt Musculoskeletal:  No deformities, BUE and BLE strength normal and equal Skin: warm and dry  Neuro:  CNs 2-12 intact, no focal abnormalities noted Psych:  Normal affect   EKG:  The EKG was personally reviewed and demonstrates: 4/14 ECG is sinus rhythm, right bundle is old, PVCs noted, no significant change from  06/30/2019 Telemetry:  Telemetry was personally reviewed and demonstrates: Sinus rhythm, frequent PVCs with bigeminy and trigeminy at times, 3-4 beat runs of nonsustained VT   CV studies:   ECHO: 08/12/2019 1. Since the last study on on 12/08/2018 aortic stenosis is now severe.  2. Left ventricular ejection fraction, by estimation, is 60 to 65%. The left ventricle has normal function. The left ventricle has no regional wall motion abnormalities. There is mild concentric left ventricular hypertrophy. Left ventricular diastolic  parameters are consistent with Grade I diastolic dysfunction (impaired relaxation).  3. Right ventricular systolic function is normal. The right ventricular size is normal.  4. The mitral valve is normal in structure. Mild mitral valve  regurgitation. No evidence of mitral stenosis.  5. The aortic valve is normal in structure. Aortic valve regurgitation is not visualized. Severe aortic valve stenosis. Aortic valve mean gradient measures 47.0 mmHg, peak gradient 62.7 mmHg.  Valve area by VTI is 0.63 cm 6. The inferior  vena cava is normal in size with greater than 50% respiratory variability, suggesting right atrial pressure of 3 mmHg.   ECHO: 12/08/2018 1. The left ventricle has normal systolic function, with an ejection  fraction of 55-60%. The cavity size was normal. There is moderately  increased left ventricular wall thickness. Left ventricular diastolic  Doppler parameters are consistent with  impaired relaxation. Elevated mean left atrial pressure.  2. The right ventricle has normal systolic function. The cavity was  normal. There is no increase in right ventricular wall thickness.  3. Left atrial size was severely dilated.  4. Mild thickening of the mitral valve leaflet. Mild calcification of the  mitral valve leaflet. There is moderate mitral annular calcification  present. No evidence of mitral valve stenosis.  5. The aortic valve has an  indeterminate number of cusps. Moderate  thickening of the aortic valve. Moderate calcification of the aortic  valve. Aortic valve regurgitation is moderate by color flow Doppler. Moderate-severe stenosis of the aortic valve.  Moderate aortic annular calcification noted.  6. Moderate to severe aortic stenosis. The highest gradient is noted with the pedhoff probe, 50.9 mmHg. Mean gradient 38 mmHg, dimensionless index ratio 0.28, AVA VTI 0.84.  7. The aorta is normal in size and structure.  8. Pulmonary hypertension is indeterminate, inadequate TR jet.  9. The interatrial septum was not well visualized.   CATH: 07/11/2015  Prox LAD lesion, 45% stenosed.  There is moderate to severe left ventricular systolic dysfunction.   Nonischemic cardiomyopathy with global hypokinesis with more pronounced inferior hypocontractility and overall ejection fraction at 35%.  Mild coronary calcification with 40-50% eccentric proximal LAD stenosis just prior to a septal perforating artery; mild calcification of the proximal left circumflex coronary artery; and large dominant RCA without significant obstructive stenoses.  Probably at least mild aortic stenosis with LV to AO gradient ranging from 10 -18 mm Hg in this patient with reduced LV function; LVEDP 13 mm Hg.   RECOMMENDATION: Medical therapy for his cardiomyopathy and mild CAD.  Consider a dobutamine stress echo if concern for low gradient AS with reduced LV function.   Laboratory Data:   Chemistry Recent Labs  Lab 08/11/19 2021 08/13/19 0306 08/13/19 1020  NA 136 136 135  K 3.7 3.9 4.4  CL 96* 95* 96*  CO2 27 26 22   GLUCOSE 97 76 88  BUN 27* 47* 51*  CREATININE 5.43* 7.49* 7.91*  CALCIUM 9.1 9.7 9.3  GFRNONAA 9* 6* 6*  GFRAA 11* 7* 7*  ANIONGAP 13 15 17*    Lab Results  Component Value Date   ALT 21 08/10/2019   AST 68 (H) 08/10/2019   ALKPHOS 72 08/10/2019   BILITOT 0.9 08/10/2019   Hematology Recent Labs  Lab  08/12/19 0518 08/13/19 0306 08/13/19 1020  WBC 40.6* 39.0* 40.8*  RBC 5.99* 5.61 5.38  HGB 12.1* 11.3* 11.3*  HCT 37.1* 34.5* 33.4*  MCV 61.9* 61.5* 62.1*  MCH 20.2* 20.1* 21.0*  MCHC 32.6 32.8 33.8  RDW 18.6* 18.9* 18.0*  PLT 415* 438* 463*   Cardiac Enzymes High Sensitivity Troponin:  No results for input(s): TROPONINIHS in the last 720 hours.    BNPNo results for input(s): BNP, PROBNP in the last 168 hours.  DDimer No results for input(s): DDIMER in the last 168 hours. TSH:  Lab Results  Component Value Date   TSH 1.708 08/11/2019   Lipids: Lab Results  Component Value Date   CHOL  12/01/2009    140  ATP III CLASSIFICATION:  <200     mg/dL   Desirable  200-239  mg/dL   Borderline High  >=240    mg/dL   High          HDL 31 (L) 12/01/2009   LDLCALC  12/01/2009    85        Total Cholesterol/HDL:CHD Risk Coronary Heart Disease Risk Table                     Men   Women  1/2 Average Risk   3.4   3.3  Average Risk       5.0   4.4  2 X Average Risk   9.6   7.1  3 X Average Risk  23.4   11.0        Use the calculated Patient Ratio above and the CHD Risk Table to determine the patient's CHD Risk.        ATP III CLASSIFICATION (LDL):  <100     mg/dL   Optimal  100-129  mg/dL   Near or Above                    Optimal  130-159  mg/dL   Borderline  160-189  mg/dL   High  >190     mg/dL   Very High   TRIG 122 12/01/2009   CHOLHDL 4.5 12/01/2009   HgbA1c:No results found for: HGBA1C Magnesium:  Magnesium  Date Value Ref Range Status  08/13/2019 2.0 1.7 - 2.4 mg/dL Final    Comment:    Performed at [facility], [place] [address], [place], [place] [address]     Radiology/Studies:  DG Chest 2 View  Result Date: 08/10/2019 CLINICAL DATA:  Hiccups, post amputation LEFT foot, hypertension, end-stage renal disease EXAM: CHEST - 2 VIEW COMPARISON:  06/30/2019 FINDINGS: Normal heart size, mediastinal contours, and pulmonary vascularity. Atherosclerotic  calcification aorta. Mild chronic elevation of LEFT diaphragm with LEFT basilar atelectasis. Lungs otherwise clear. No acute infiltrate, pleural effusion or pneumothorax. BILATERAL chronic rotator cuff tears. Prior rodding of RIGHT humerus. IMPRESSION: Chronic LEFT basilar atelectasis. No acute abnormalities. Electronically Signed   By: Lavonia Dana M.D.   On: 08/10/2019 08:19   CT HEAD WO CONTRAST  Result Date: 08/10/2019 CLINICAL DATA:  Encephalopathy. EXAM: CT HEAD WITHOUT CONTRAST TECHNIQUE: Contiguous axial images were obtained from the base of the skull through the vertex without intravenous contrast. COMPARISON:  Head CT 03/05/2015, report from brain MRI 06/15/1999 (images unavailable). FINDINGS: Brain: There is no evidence of acute intracranial hemorrhage, intracranial mass, midline shift or extra-axial fluid collection.No evidence of acute demarcated cortical infarct. Redemonstrated small chronic cortically based infarct within the posterior right frontal lobe. Mild scattered hypoattenuation within the cerebral white matter is similar to prior exam and nonspecific, but consistent with chronic small vessel ischemic disease. Stable, mild generalized parenchymal atrophy. Vascular: No hyperdense vessel.  Atherosclerotic calcifications. Skull: Normal. Negative for fracture or focal lesion. Sinuses/Orbits: Visualized orbits demonstrate no acute abnormality. Mild right frontal sinus mucosal thickening. No significant mastoid effusion at the imaged levels. IMPRESSION: No evidence of acute intracranial abnormality. Redemonstrated small chronic cortically based right frontal lobe infarct. Stable background mild generalized parenchymal atrophy and chronic small vessel ischemic disease. Right frontal sinus mucosal thickening. Electronically Signed   By: Kellie Simmering DO   On: 08/10/2019 13:52   DG Foot Complete Left  Result Date: 08/10/2019 CLINICAL DATA:  Postop, pain, discharge, odor  EXAM: LEFT FOOT - COMPLETE  3+ VIEW COMPARISON:  Portable exam 0746 hours compared to 06/30/2019 FINDINGS: Interval transmetatarsal amputation. Linear bone fragment identified at amputation bed of first metatarsal. Osseous demineralization. Scattered atherosclerotic calcifications. No fracture, dislocation, or definite bone destruction. IMPRESSION: Postoperative changes of transmetatarsal amputation LEFT foot as above. If there is persistent clinical concern for osteomyelitis or soft tissue infection, consider MR. Electronically Signed   By: Lavonia Dana M.D.   On: 08/10/2019 08:16   ECHOCARDIOGRAM COMPLETE  Result Date: 08/12/2019    ECHOCARDIOGRAM REPORT   Patient Name:   ALQUAN MORRISH Sr. Date of Exam: 08/12/2019 Medical Rec #:  [id number]       Height:       65.0 in Accession #:    [id number]      Weight:       140.9 lb Date of Birth:  1940-09-19       BSA:          1.704 m Patient Age:    7 years        BP:           134/66 mmHg Patient Gender: M               HR:           89 bpm. Exam Location:  Inpatient Procedure: 2D Echo Indications:    Bacteremia 790.7 / R78.81  History:        Patient has prior history of Echocardiogram examinations, most                 recent 12/08/2018. Non-ischemic cardiomyopathy, CAD, Aortic                 stenosis; Risk Factors:Hypertension. ESRD on dialysis                 Prolonged QT interval.  Sonographer:    Vikki Ports Turrentine Referring Phys: [id number] Charlesetta Ivory GONFA  Sonographer Comments: Suboptimal subcostal window. Image acquisition challenging due to uncooperative patient. IMPRESSIONS  1. Since the last study on on 12/08/2018 aortic stenosis is now severe.  2. Left ventricular ejection fraction, by estimation, is 60 to 65%. The left ventricle has normal function. The left ventricle has no regional wall motion abnormalities. There is mild concentric left ventricular hypertrophy. Left ventricular diastolic parameters are consistent with Grade I diastolic dysfunction (impaired relaxation).  3. Right  ventricular systolic function is normal. The right ventricular size is normal.  4. The mitral valve is normal in structure. Mild mitral valve regurgitation. No evidence of mitral stenosis.  5. The aortic valve is normal in structure. Aortic valve regurgitation is not visualized. Severe aortic valve stenosis. Aortic valve mean gradient measures 47.0 mmHg.  6. The inferior vena cava is normal in size with greater than 50% respiratory variability, suggesting right atrial pressure of 3 mmHg. Conclusion(s)/Recommendation(s): No evidence of valvular vegetations on this transthoracic echocardiogram. Would recommend a transesophageal echocardiogram to exclude infective endocarditis if clinically indicated. FINDINGS  Left Ventricle: Left ventricular ejection fraction, by estimation, is 60 to 65%. The left ventricle has normal function. The left ventricle has no regional wall motion abnormalities. The left ventricular internal cavity size was normal in size. There is  mild concentric left ventricular hypertrophy. Left ventricular diastolic parameters are consistent with Grade I diastolic dysfunction (impaired relaxation). Normal left ventricular filling pressure. Right Ventricle: The right ventricular size is normal. No increase in right ventricular wall thickness. Right ventricular systolic function is  normal. Left Atrium: Left atrial size was normal in size. Right Atrium: Right atrial size was normal in size. Pericardium: There is no evidence of pericardial effusion. Mitral Valve: The mitral valve is normal in structure. There is moderate thickening of the mitral valve leaflet(s). Normal mobility of the mitral valve leaflets. Severe mitral annular calcification. Mild mitral valve regurgitation. No evidence of mitral valve stenosis. Tricuspid Valve: The tricuspid valve is normal in structure. Tricuspid valve regurgitation is mild . No evidence of tricuspid stenosis. Aortic Valve: The aortic valve is normal in structure..  There is severe thickening and severe calcifcation of the aortic valve. Aortic valve regurgitation is not visualized. Severe aortic stenosis is present. There is severe thickening of the aortic valve. There is severe calcifcation of the aortic valve. Aortic valve mean gradient measures 47.0 mmHg. Aortic valve peak gradient measures 62.7 mmHg. Aortic valve area, by VTI measures 0.63 cm. Pulmonic Valve: The pulmonic valve was normal in structure. Pulmonic valve regurgitation is not visualized. No evidence of pulmonic stenosis. Aorta: The aortic root is normal in size and structure. Venous: The inferior vena cava is normal in size with greater than 50% respiratory variability, suggesting right atrial pressure of 3 mmHg. IAS/Shunts: No atrial level shunt detected by color flow Doppler.  LEFT VENTRICLE PLAX 2D LVIDd:         4.80 cm  Diastology LVIDs:         3.30 cm  LV e' lateral:   11.70 cm/s LV PW:         1.10 cm  LV E/e' lateral: 9.5 LV IVS:        1.10 cm  LV e' medial:    6.20 cm/s LVOT diam:     1.80 cm  LV E/e' medial:  17.9 LV SV:         44 LV SV Index:   26 LVOT Area:     2.54 cm  RIGHT VENTRICLE RV S prime:     10.70 cm/s TAPSE (M-mode): 2.6 cm LEFT ATRIUM             Index       RIGHT ATRIUM           Index LA diam:        3.80 cm 2.23 cm/m  RA Area:     15.70 cm LA Vol (A2C):   63.6 ml 37.32 ml/m RA Volume:   37.90 ml  22.24 ml/m LA Vol (A4C):   51.5 ml 30.22 ml/m LA Biplane Vol: 61.5 ml 36.08 ml/m  AORTIC VALVE AV Area (Vmax):    0.76 cm AV Area (Vmean):   0.70 cm AV Area (VTI):     0.63 cm AV Vmax:           395.80 cm/s AV Vmean:          287.800 cm/s AV VTI:            0.696 m AV Peak Grad:      62.7 mmHg AV Mean Grad:      47.0 mmHg LVOT Vmax:         118.00 cm/s LVOT Vmean:        79.000 cm/s LVOT VTI:          0.171 m LVOT/AV VTI ratio: 0.25  AORTA Ao Root diam: 2.80 cm MITRAL VALVE MV Area (PHT): 4.17 cm     SHUNTS MV Decel Time: 182 msec     Systemic VTI:  0.17 m MV  E velocity: 111.00  cm/s  Systemic Diam: 1.80 cm MV A velocity: 147.00 cm/s MV E/A ratio:  0.76 Ena Dawley MD Electronically signed by Ena Dawley MD Signature Date/Time: 08/12/2019/11:02:44 PM    Final     Assessment and Plan:   1.  Severe aortic stenosis: -Gradients have indeed increased in the last 6 months -Volume status is managed by dialysis, he has no history of chest pain or syncope -In the setting of his acute illness, no further work-up is indicated at this time. -We will arrange outpatient follow-up with Dr. Domenic Polite  Otherwise, per IM, Nephrology, Surgery, ID Principal Problem:   Bacteremia due to Enterococcus Active Problems:   PVD (peripheral vascular disease) ([place])   Gangrene of left foot ([place])   Postoperative wound infection   Prolonged QT interval   Acute metabolic encephalopathy   Thrombocytosis ([facility])   Hyperkalemia   Left foot infection     For questions or updates, please contact [place] Please consult [email or web address] for contact info under Cardiology/STEMI.   Signed, Rosaria Ferries, PA-C  08/13/2019 11:46 AM  Agree with note by Rosaria Ferries PA-C  [name] was admitted on 08/09/2019 with sepsis and nonhealing wound on his left leg.  He is a cardiology patient of Dr. Myles Gip.  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  Other problems include hypertension, diastolic heart failure, prior stroke and PAD.  He did have moderate aortic stenosis by 2D echo in August of last year which has progressed to severe AS during his admission with a valve area of 0.65 cm and increase in his peak gradient.  His white count is 40,000.  He is on IV antibiotics.  We are asked to see him for TEE to rule out endocarditis.  He does not have a loud left ear murmur.  Lungs are clear.  He said his his left leg hurts.  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  We certainly can set him up for a TEE on Monday to more thoroughly assess his aortic valve.  In the  future, I do not know that he is a candidate for SAVR or TAVR given his quality of life and comorbidities.  Lorretta Harp, M.D., [place], [facility], [place] [address] [place], [place]  [id number]  [phone] 08/13/2019 1:27 PM

## 2019-08-13 NOTE — Interval H&P Note (Signed)
History and Physical Interval Note:  08/13/2019 6:55 AM  Joseph Maryland Sr.  has presented today for surgery, with the diagnosis of Osetomylitis left foot.  The various methods of treatment have been discussed with the patient and family. After consideration of risks, benefits and other options for treatment, the patient has consented to  Procedure(s): AMPUTATION BELOW KNEE (Left) as a surgical intervention.  The patient's history has been reviewed, patient examined, no change in status, stable for surgery.  I have reviewed the patient's chart and labs.  Questions were answered to the patient's satisfaction.     [name]

## 2019-08-14 DIAGNOSIS — B952 Enterococcus as the cause of diseases classified elsewhere: Secondary | ICD-10-CM | POA: Diagnosis not present

## 2019-08-14 DIAGNOSIS — R7881 Bacteremia: Secondary | ICD-10-CM | POA: Diagnosis not present

## 2019-08-14 LAB — RENAL FUNCTION PANEL
Albumin: 1.7 g/dL — ABNORMAL LOW (ref 3.5–5.0)
Anion gap: 14 (ref 5–15)
BUN: 19 mg/dL (ref 8–23)
CO2: 25 mmol/L (ref 22–32)
Calcium: 8.3 mg/dL — ABNORMAL LOW (ref 8.9–10.3)
Chloride: 98 mmol/L (ref 98–111)
Creatinine, Ser: 4.01 mg/dL — ABNORMAL HIGH (ref 0.61–1.24)
GFR calc Af Amer: 16 mL/min — ABNORMAL LOW (ref >60)
GFR calc non Af Amer: 14 mL/min — ABNORMAL LOW (ref >60)
Glucose, Bld: 72 mg/dL (ref 70–99)
Phosphorus: 2.1 mg/dL — ABNORMAL LOW (ref 2.5–4.6)
Potassium: 4 mmol/L (ref 3.5–5.1)
Sodium: 137 mmol/L (ref 135–145)

## 2019-08-14 LAB — CBC WITH DIFFERENTIAL/PLATELET
Abs Immature Granulocytes: 1.06 10*3/uL — ABNORMAL HIGH (ref 0.00–0.07)
Basophils Absolute: 0.1 10*3/uL (ref 0.0–0.1)
Basophils Relative: 0 %
Eosinophils Absolute: 0.2 10*3/uL (ref 0.0–0.5)
Eosinophils Relative: 1 %
HCT: 33.3 % — ABNORMAL LOW (ref 39.0–52.0)
Hemoglobin: 11 g/dL — ABNORMAL LOW (ref 13.0–17.0)
Immature Granulocytes: 3 %
Lymphocytes Relative: 6 %
Lymphs Abs: 1.8 10*3/uL (ref 0.7–4.0)
MCH: 20.3 pg — ABNORMAL LOW (ref 26.0–34.0)
MCHC: 33 g/dL (ref 30.0–36.0)
MCV: 61.3 fL — ABNORMAL LOW (ref 80.0–100.0)
Monocytes Absolute: 1.6 10*3/uL — ABNORMAL HIGH (ref 0.1–1.0)
Monocytes Relative: 5 %
Neutro Abs: 26.6 10*3/uL — ABNORMAL HIGH (ref 1.7–7.7)
Neutrophils Relative %: 85 %
Platelets: 404 10*3/uL — ABNORMAL HIGH (ref 150–400)
RBC: 5.43 MIL/uL (ref 4.22–5.81)
RDW: 18.7 % — ABNORMAL HIGH (ref 11.5–15.5)
WBC: 31.3 10*3/uL — ABNORMAL HIGH (ref 4.0–10.5)
nRBC: 0.1 % (ref 0.0–0.2)

## 2019-08-14 LAB — MAGNESIUM: Magnesium: 1.9 mg/dL (ref 1.7–2.4)

## 2019-08-14 MED ORDER — VANCOMYCIN HCL IN DEXTROSE 750-5 MG/150ML-% IV SOLN
INTRAVENOUS | Status: AC
  Administered 2019-08-14: 750 mg via INTRAVENOUS
  Filled 2019-08-14: qty 150

## 2019-08-14 MED ORDER — DOXERCALCIFEROL 4 MCG/2ML IV SOLN
INTRAVENOUS | Status: AC
  Administered 2019-08-14: 2.5 ug via ARTERIOVENOUS_FISTULA
  Filled 2019-08-14: qty 2

## 2019-08-14 NOTE — Progress Notes (Signed)
PROGRESS NOTE  Joseph L Servantes Sr. MRN:[id number] DOB: 03/01/1941   PCP: Fanta, Tesfaye, MD  Patient is from: Home  DOA: 08/09/2019 LOS: 4  Brief Narrative / Interim history: 78-year-old male with history of ESRD on HD MWF, diastolic CHF, moderate AS, PVD, HTN, IDA and bilateral transmetatarsal amputation brought to ED due to altered mental status during dialysis.  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  Admitted for sepsis in the setting of postoperative left foot infection and found to have Enterococcus bacteremia.  ID, orthopedic surgery (Dr. Duda) and nephrology following.  Repeat blood culture negative.  TTE without vegetation but severe aortic stenosis.  Cardiology consulted for TEE and evaluation of aortic stenosis.  Patient underwent left transtibial amputation by Dr. Duda on 4/16.  Subjective: Seen and examined earlier this morning.  No major events overnight or this morning.  No complaint this morning.  Pain fairly controlled.  He denies chest pain, dyspnea or GI symptoms.  Objective: Vitals:   08/14/19 0149 08/14/19 0240 08/14/19 0624 08/14/19 0839  BP: (!) 158/63 (!) 102/55  (!) 103/50  Pulse: 99 99  73  Resp: (!) 21  (!) 22 (!) 24  Temp: 98.6 F (37 C) 98.5 F (36.9 C)  98.3 F (36.8 C)  TempSrc: Oral Oral  Oral  SpO2: 97% 98%  98%  Weight: 64.6 kg  62.5 kg   Height:        Intake/Output Summary (Last 24 hours) at 08/14/2019 1223 Last data filed at 08/14/2019 0149 Gross per 24 hour  Intake 751.94 ml  Output 1000 ml  Net -248.06 ml   Filed Weights   08/13/19 2150 08/14/19 0149 08/14/19 0624  Weight: 65.7 kg 64.6 kg 62.5 kg    Examination: GENERAL: No apparent distress.  Nontoxic. HEENT: MMM.  Vision and hearing grossly intact.  NECK: Supple.  No apparent JVD.  RESP:  No IWOB. Good air movement bilaterally. CVS:  RRR.  2/6 SEM all over. ABD/GI/GU: Bowel sounds present. Soft. Non tender.  MSK/EXT:  Moves  extremities.  Right transmetatarsal amputation.  Limb protector and the stump shrinker over LLE.  No significant drainage in wound VAC canister. SKIN: Skin wound over the plantar aspect of right foot without signs of infection. NEURO: Awake, alert and oriented appropriately.  No apparent focal neuro deficit. PSYCH: Calm. Normal affect.    Procedures:  4/16-left transtibial amputation with wound VAC placement  Microbiology summarized: 4/13-COVID-19 PCR negative. 4/13-blood cultures with GPC.  BICD with Enterococcus. 4/14-blood cultures NGTD  Assessment & Plan: Sepsis due to Enterococcus bacteremia  Postoperative left foot wound infection-patient had left transmetatarsal amputation on 3/24. -Underwent left transtibial amputation on 4/16 by Dr. Duda. -Still with significant leukocytosis.  CRP and ESR mildly elevated. -Repeat blood culture on 4/14 NGTD -TTE without vegetation but severe aortic stenosis -Cardiology consulted for TEE and aortic stenosis-TEE tentatively scheduled for 4/19. -Continue vancomycin per ID. -Trend leukocytosis.  Acute metabolic encephalopathy: Likely due to the above.  CT head without acute finding.  No focal neuro deficit.  Seems to have resolved.  He is fairly oriented today. Suspect secondary to infection as seen above. -Monitor sepsis/bacteremia/infection as above  ESRD on HD M/W/F at the [facility]:  Azotemia/hyperkalemia/hyponatremia/Anion gap metabolic acidosis/BMD Anemia of renal disease -Nephrology managing  Severe aortic stenosis/chronic diastolic CHF: No cardiopulmonary symptoms.  Appears euvolemic. -Cardiology consulted-recommended outpatient follow-up. -Fluid management by dialysis -Strict intake and output  Hypotension: Normotensive -Continue midodrine on dialysis   days  Peripheral vascular disease:  -Resume Plavix.  History of CVA in 2000: No apparent neuro deficit on exam. -Continue home medications  Hyperlipidemia:    -Continue home atorvastatin  Bandemia/thrombocytosis: Likely reactive from #1. -Continue monitoring-expect improvement after surgical intervention.  Debility -PT/OT                   DVT prophylaxis: Subcu heparin Code Status: Full code Family Communication: None at bedside.  Discharge barrier: Evaluation and treatment of Enterococcus bacteremia/postoperative left foot infection on IV antibiotics Patient is from: Home Final disposition: Likely home in the next 3 to 4 days once cleared by Ortho, ID and nephrology.  Consultants:  Orthopedic surgery, Dr. Duda Infectious disease Nephrology   Sch Meds:  Scheduled Meds: . atorvastatin  40 mg Oral Daily  . Chlorhexidine Gluconate Cloth  6 each Topical Q0600  . cinacalcet  60 mg Oral QPM  . docusate sodium  100 mg Oral BID  . doxercalciferol  2.5 mcg Oral Q M,W,F-HD  . heparin  5,000 Units Subcutaneous Q8H  . melatonin  3 mg Oral QHS  . midodrine  10 mg Oral Q M,W,F  . multivitamin  1 tablet Oral QHS  . mupirocin ointment  1 application Nasal BID  . saccharomyces boulardii  250 mg Oral BID  . senna-docusate  1 tablet Oral Daily   Continuous Infusions: . sodium chloride Stopped (08/13/19 1817)  . vancomycin 750 mg (08/14/19 0043)   PRN Meds:.acetaminophen **OR** acetaminophen, albuterol, HYDROmorphone (DILAUDID) injection, oxyCODONE  Antimicrobials: Anti-infectives (From admission, onward)   Start     Dose/Rate Route Frequency Ordered Stop   08/13/19 2200  ceFAZolin (ANCEF) IVPB 1 g/50 mL premix     1 g 100 mL/hr over 30 Minutes Intravenous  Once 08/13/19 0947 08/14/19 0321   08/13/19 0738  ceFAZolin (ANCEF) 2-4 GM/100ML-% IVPB    Note to Pharmacy: Jiang, Fudan   : cabinet override      08/13/19 0738 08/13/19 1944   08/11/19 1200  vancomycin (VANCOCIN) IVPB 750 mg/150 ml premix     750 mg 150 mL/hr over 60 Minutes Intravenous Every M-W-F (Hemodialysis) 08/11/19 0903     08/11/19 1000  ampicillin (OMNIPEN)  2 g in sodium chloride 0.9 % 100 mL IVPB  Status:  Discontinued     2 g 300 mL/hr over 20 Minutes Intravenous Every 12 hours 08/11/19 0405 08/11/19 0857   08/11/19 0915  vancomycin (VANCOREADY) IVPB 1250 mg/250 mL  Status:  Discontinued     1,250 mg 166.7 mL/hr over 90 Minutes Intravenous  Once 08/11/19 0903 08/11/19 0907   08/11/19 0915  ceFAZolin (ANCEF) IVPB 2g/100 mL premix     2 g 200 mL/hr over 30 Minutes Intravenous On call to O.R. 08/11/19 0909 08/12/19 0559   08/11/19 0000  vancomycin variable dose per unstable renal function (pharmacist dosing)  Status:  Discontinued      Does not apply See admin instructions 08/10/19 1108 08/11/19 0405   08/10/19 1600  piperacillin-tazobactam (ZOSYN) IVPB 2.25 g  Status:  Discontinued     2.25 g 100 mL/hr over 30 Minutes Intravenous Every 8 hours 08/10/19 1108 08/11/19 0405   08/10/19 1115  vancomycin (VANCOREADY) IVPB 500 mg/100 mL     500 mg 100 mL/hr over 60 Minutes Intravenous NOW 08/10/19 1105 08/10/19 1435   08/10/19 0745  piperacillin-tazobactam (ZOSYN) IVPB 3.375 g     3.375 g 100 mL/hr over 30 Minutes Intravenous  Once 08/10/19 0732 08/10/19 0935     08/10/19 0745  vancomycin (VANCOCIN) IVPB 1000 mg/200 mL premix     1,000 mg 200 mL/hr over 60 Minutes Intravenous  Once 08/10/19 0732 08/10/19 0959       I have personally reviewed the following labs and images: CBC: Recent Labs  Lab 08/11/19 1130 08/12/19 0518 08/13/19 0306 08/13/19 1020 08/14/19 0509  WBC 38.1* 40.6* 39.0* 40.8* 31.3*  NEUTROABS  --  35.8* 34.2*  --  26.6*  HGB 11.5* 12.1* 11.3* 11.3* 11.0*  HCT 34.2* 37.1* 34.5* 33.4* 33.3*  MCV 60.7* 61.9* 61.5* 62.1* 61.3*  PLT 362 415* 438* 463* 404*   BMP &GFR Recent Labs  Lab 08/10/19 1134 08/10/19 1134 08/11/19 0306 08/11/19 0306 08/11/19 1130 08/11/19 2021 08/13/19 0306 08/13/19 1020 08/14/19 0509  NA 133*   < > 131*   < > 132* 136 136 135 137  K 4.9   < > 5.3*   < > 5.5* 3.7 3.9 4.4 4.0  CL 93*   < >  92*   < > 92* 96* 95* 96* 98  CO2 25   < > 22   < > 20* _0 GLUCOSE 113*   < > 70   < > 70 97 76 88 72  BUN 69*   < > 77*   < > 81* 27* 47* 51* 19  CREATININE 10.75*   < > 11.38*   < > 11.57* 5.43* 7.49* 7.91* 4.01*  CALCIUM 9.7   < > 9.9   < > 9.8 9.1 9.7 9.3 8.3*  MG  --   --   --   --   --  1.9 2.0  --  1.9  PHOS 2.8  --  2.5  --   --   --   --  3.0 2.1*   < > = values in this interval not displayed.   Estimated Creatinine Clearance: 13.6 mL/min (A) (by C-G formula based on SCr of 4.01 mg/dL (H)). Liver & Pancreas: Recent Labs  Lab 08/10/19 0822 08/10/19 1134 08/11/19 0306 08/13/19 1020 08/14/19 0509  AST 68*  --   --   --   --   ALT 21  --   --   --   --   ALKPHOS 72  --   --   --   --   BILITOT 0.9  --   --   --   --   PROT 7.9  --   --   --   --   ALBUMIN 2.1* 1.8* 1.9* 1.8* 1.7*   Recent Labs  Lab 08/10/19 0822  LIPASE 20   No results for input(s): AMMONIA in the last 168 hours. Diabetic: No results for input(s): HGBA1C in the last 72 hours. Recent Labs  Lab 08/10/19 0652 08/13/19 0833  GLUCAP 114* 86   Cardiac Enzymes: No results for input(s): CKTOTAL, CKMB, CKMBINDEX, TROPONINI in the last 168 hours. No results for input(s): PROBNP in the last 8760 hours. Coagulation Profile: No results for input(s): INR, PROTIME in the last 168 hours. Thyroid Function Tests: Recent Labs    08/11/19 2021  TSH 1.708   Lipid Profile: No results for input(s): CHOL, HDL, LDLCALC, TRIG, CHOLHDL, LDLDIRECT in the last 72 hours. Anemia Panel: No results for input(s): VITAMINB12, FOLATE, FERRITIN, TIBC, IRON, RETICCTPCT in the last 72 hours. Urine analysis: No results found for: COLORURINE, APPEARANCEUR, LABSPEC, PHURINE, GLUCOSEU, HGBUR, BILIRUBINUR, KETONESUR, PROTEINUR, UROBILINOGEN, NITRITE, LEUKOCYTESUR Sepsis Labs: Invalid input(s): PROCALCITONIN, [place]  Microbiology:  Recent Results (from the past 240 hour(s))  Blood culture (routine x 2)     Status:  Abnormal   Collection Time: 08/10/19  4:30 AM   Specimen: BLOOD LEFT ARM  Result Value Ref Range Status   Specimen Description BLOOD LEFT ARM  Final   Special Requests   Final    BOTTLES DRAWN AEROBIC AND ANAEROBIC Blood Culture adequate volume   Culture  Setup Time   Final    IN BOTH AEROBIC AND ANAEROBIC BOTTLES GRAM POSITIVE COCCI CRITICAL RESULT CALLED TO, READ BACK BY AND VERIFIED WITH: G. ABBOTT,PHARMD 0248 08/11/2019 T. TYSOR    Culture (A)  Final    ENTEROCOCCUS FAECALIS SUSCEPTIBILITIES PERFORMED ON PREVIOUS CULTURE WITHIN THE LAST 5 DAYS. Performed at [facility], [address] [place], [place] [address]    Report Status 08/13/2019 FINAL  Final  Blood Culture ID Panel (Reflexed)     Status: Abnormal   Collection Time: 08/10/19  4:30 AM  Result Value Ref Range Status   Enterococcus species DETECTED (A) NOT DETECTED Final    Comment: CRITICAL RESULT CALLED TO, READ BACK BY AND VERIFIED WITH: G. ABBOTT,PHARMD 0248 08/11/2019 T. TYSOR    Vancomycin resistance NOT DETECTED NOT DETECTED Final   Listeria monocytogenes NOT DETECTED NOT DETECTED Final   Staphylococcus species NOT DETECTED NOT DETECTED Final   Staphylococcus aureus (BCID) NOT DETECTED NOT DETECTED Final   Streptococcus species NOT DETECTED NOT DETECTED Final   Streptococcus agalactiae NOT DETECTED NOT DETECTED Final   Streptococcus pneumoniae NOT DETECTED NOT DETECTED Final   Streptococcus pyogenes NOT DETECTED NOT DETECTED Final   Acinetobacter baumannii NOT DETECTED NOT DETECTED Final   Enterobacteriaceae species NOT DETECTED NOT DETECTED Final   Enterobacter cloacae complex NOT DETECTED NOT DETECTED Final   Escherichia coli NOT DETECTED NOT DETECTED Final   Klebsiella oxytoca NOT DETECTED NOT DETECTED Final   Klebsiella pneumoniae NOT DETECTED NOT DETECTED Final   Proteus species NOT DETECTED NOT DETECTED Final   Serratia marcescens NOT DETECTED NOT DETECTED Final   Haemophilus influenzae NOT  DETECTED NOT DETECTED Final   Neisseria meningitidis NOT DETECTED NOT DETECTED Final   Pseudomonas aeruginosa NOT DETECTED NOT DETECTED Final   Candida albicans NOT DETECTED NOT DETECTED Final   Candida glabrata NOT DETECTED NOT DETECTED Final   Candida krusei NOT DETECTED NOT DETECTED Final   Candida parapsilosis NOT DETECTED NOT DETECTED Final   Candida tropicalis NOT DETECTED NOT DETECTED Final    Comment: Performed at [facility], [address] [place], [place] [address]  Blood culture (routine x 2)     Status: Abnormal   Collection Time: 08/10/19  4:55 AM   Specimen: BLOOD RIGHT ARM  Result Value Ref Range Status   Specimen Description BLOOD RIGHT ARM  Final   Special Requests   Final    BOTTLES DRAWN AEROBIC ONLY Blood Culture adequate volume   Culture  Setup Time   Final    AEROBIC BOTTLE ONLY GRAM POSITIVE COCCI CRITICAL VALUE NOTED.  VALUE IS CONSISTENT WITH PREVIOUSLY REPORTED AND CALLED VALUE. Performed at [facility], [address] [place], [place] [address]    Culture ENTEROCOCCUS FAECALIS (A)  Final   Report Status 08/13/2019 FINAL  Final   Organism ID, Bacteria ENTEROCOCCUS FAECALIS  Final      Susceptibility   Enterococcus faecalis - MIC*    AMPICILLIN <=2 SENSITIVE Sensitive     VANCOMYCIN <=0.5 SENSITIVE Sensitive       GENTAMICIN SYNERGY SENSITIVE Sensitive     * ENTEROCOCCUS FAECALIS  SARS CORONAVIRUS 2 (TAT 6-24 HRS) Nasopharyngeal Nasopharyngeal Swab     Status: None   Collection Time: 08/10/19  9:59 AM   Specimen: Nasopharyngeal Swab  Result Value Ref Range Status   SARS Coronavirus 2 NEGATIVE NEGATIVE Final    Comment: (NOTE) SARS-CoV-2 target nucleic acids are NOT DETECTED. The SARS-CoV-2 RNA is generally detectable in upper and lower respiratory specimens during the acute phase of infection. Negative results do not preclude SARS-CoV-2 infection, do not rule out co-infections with other pathogens, and should not be used as the sole  basis for treatment or other patient management decisions. Negative results must be combined with clinical observations, patient history, and epidemiological information. The expected result is Negative. Fact Sheet for Patients: SugarRoll.be Fact Sheet for Healthcare Providers: https://www.woods-mathews.com/ This test is not yet approved or cleared by the [place] FDA and  has been authorized for detection and/or diagnosis of SARS-CoV-2 by FDA under an Emergency Use Authorization (EUA). This EUA will remain  in effect (meaning this test can be used) for the duration of the COVID-19 declaration under Section 56 4(b)(1) of the Act, 21 U.S.C. section 360bbb-3(b)(1), unless the authorization is terminated or revoked sooner. Performed at [facility], [place] [address], [place], [place] [address]   Culture, blood (routine x 2)     Status: None (Preliminary result)   Collection Time: 08/11/19 10:36 AM   Specimen: BLOOD RIGHT HAND  Result Value Ref Range Status   Specimen Description BLOOD RIGHT HAND  Final   Special Requests   Final    BOTTLES DRAWN AEROBIC ONLY Blood Culture adequate volume   Culture   Final    NO GROWTH 3 DAYS Performed at [facility], [place] [address] [place], [place] [address]    Report Status PENDING  Incomplete  Culture, blood (routine x 2)     Status: None (Preliminary result)   Collection Time: 08/11/19 10:39 AM   Specimen: BLOOD RIGHT HAND  Result Value Ref Range Status   Specimen Description BLOOD RIGHT HAND  Final   Special Requests   Final    BOTTLES DRAWN AEROBIC ONLY Blood Culture results may not be optimal due to an inadequate volume of blood received in culture bottles   Culture   Final    NO GROWTH 3 DAYS Performed at [facility], [place] [address], [place], [place] [address]    Report Status PENDING  Incomplete    Radiology Studies: No results found.    Tequilla Cousineau T. Ag[place]  If 7PM-7AM, please contact night-coverage [email or web address] Password [facility] 08/14/2019, 12:23 PM

## 2019-08-14 NOTE — Progress Notes (Signed)
Pharmacy Antibiotic Note  [name]. is a 79 y.o. male admitted on 08/09/2019 with wound infection.  Pharmacy has been consulted for vancomycin dosing. 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   4/13 BCX growing enterococcus faecalis pan sensitive. Vancomycin continued due to ease of administration post HD sessions.   Patient is afebrile, vital signs are stable, WBC is decreasing from 40.8 to 31.3. ID is following and recommends continuing vancomycin and to rule out IE with a TEE scheduled for 4/19.   Last HD session overnight 4/16-4/17 at 350-400 BFR x 4 hours with 750 mg IV vancomycin given after the session.   Plan: Vancomycin 750mg  IV qHD MWF F/u HD schedule/tolerance  Monitor clinical progress, pre-HD vancomycin level as indicated F/u length of therapy and any OPAT orders     Height: 5\' 5"  (165.1 cm) Weight: 62.5 kg (137 lb 12.6 oz) IBW/kg (Calculated) : 61.5  Temp (24hrs), Avg:98.1 F (36.7 C), Min:97.8 F (36.6 C), Max:98.6 F (37 C)  Recent Labs  Lab 08/10/19 0451 08/10/19 0822 08/10/19 1121 08/10/19 1134 08/11/19 1130 08/11/19 2021 08/12/19 0518 08/13/19 0306 08/13/19 1020 08/14/19 0509  WBC  --   --   --    < > 38.1*  --  40.6* 39.0* 40.8* 31.3*  CREATININE  --   --   --    < > 11.57* 5.43*  --  7.49* 7.91* 4.01*  LATICACIDVEN 2.8* 2.4* 2.9*  --   --   --   --   --   --   --    < > = values in this interval not displayed.    Estimated Creatinine Clearance: 13.6 mL/min (A) (by C-G formula based on SCr of 4.01 mg/dL (H)).    Allergies  Allergen Reactions  . Aspirin Other (See Comments)    Causes internal bleeding  History of ulcers   Antimicrobials this admission:  Vanc 4/13 >>  Zosyn 4/13>>4/14  Bactroban/CHG 4/14>> (4/18)  Cefazolin pre-op 4/16 >> x 1 post-op post-HD (Rx adj'd)  Cultures this admission:  3/24 MRSA PCR: positive 4/13 Blood x 2: Enterococcus, sens Amp, Vanc MIC <0.5 4/14 blood  x 2: ng x 2 days to date 4/13 COVID: negative   Thank you,   Eddie Candle, PharmD PGY-1 Pharmacy Resident   Please check amion for clinical pharmacist contact number

## 2019-08-14 NOTE — Progress Notes (Signed)
Patient ID: [name]., male   DOB: 11/09/1940, 79 y.o.   MRN: [id number] Patient is postoperative day 1 transtibial amputation.  He has no complaints this morning no drainage in the wound VAC canister there is a good suction fit.  Patient has a limb protector and stump shrinker from [employer].

## 2019-08-14 NOTE — H&P (View-Only) (Signed)
Progress Note  Patient Name: Joseph MAHONE Sr. Date of Encounter: 08/14/2019  Primary Cardiologist:   Rozann Lesches, MD   Subjective   Denies pain or SOB  Inpatient Medications    Scheduled Meds: . atorvastatin  40 mg Oral Daily  . Chlorhexidine Gluconate Cloth  6 each Topical Q0600  . cinacalcet  60 mg Oral QPM  . docusate sodium  100 mg Oral BID  . doxercalciferol  2.5 mcg Oral Q M,W,F-HD  . heparin  5,000 Units Subcutaneous Q8H  . melatonin  3 mg Oral QHS  . midodrine  10 mg Oral Q M,W,F  . multivitamin  1 tablet Oral QHS  . mupirocin ointment  1 application Nasal BID  . saccharomyces boulardii  250 mg Oral BID  . senna-docusate  1 tablet Oral Daily   Continuous Infusions: . sodium chloride Stopped (08/13/19 1817)  . vancomycin 750 mg (08/14/19 0043)   PRN Meds: acetaminophen **OR** acetaminophen, albuterol, HYDROmorphone (DILAUDID) injection, oxyCODONE   Vital Signs    Vitals:   08/14/19 0149 08/14/19 0240 08/14/19 0624 08/14/19 0839  BP: (!) 158/63 (!) 102/55  (!) 103/50  Pulse: 99 99  73  Resp: (!) 21  (!) 22 (!) 24  Temp: 98.6 F (37 C) 98.5 F (36.9 C)  98.3 F (36.8 C)  TempSrc: Oral Oral  Oral  SpO2: 97% 98%  98%  Weight: 64.6 kg  62.5 kg   Height:        Intake/Output Summary (Last 24 hours) at 08/14/2019 1152 Last data filed at 08/14/2019 0149 Gross per 24 hour  Intake 751.94 ml  Output 1000 ml  Net -248.06 ml   Filed Weights   08/13/19 2150 08/14/19 0149 08/14/19 0624  Weight: 65.7 kg 64.6 kg 62.5 kg    Telemetry    NSR with ventricular and atrial ectopy and sinus tach but no sustained arrhythmias - Personally Reviewed  ECG    NA - Personally Reviewed  Physical Exam   GEN: No acute distress.   Neck: No  JVD Cardiac: RRR, 3/6 apical mid peaking systolic murmur, no diastolic. Respiratory: Clear  to auscultation bilaterally. GI: Soft, nontender, non-distended  MS: No  edema; No deformity. Neuro:  Nonfocal  Psych: Normal  affect   Labs    Chemistry Recent Labs  Lab 08/10/19 0822 08/10/19 1134 08/11/19 0306 08/11/19 1130 08/13/19 0306 08/13/19 1020 08/14/19 0509  NA  --    < > 131*   < > 136 135 137  K  --    < > 5.3*   < > 3.9 4.4 4.0  CL  --    < > 92*   < > 95* 96* 98  CO2  --    < > 22   < > 26 22 25   GLUCOSE  --    < > 70   < > 76 88 72  BUN  --    < > 77*   < > 47* 51* 19  CREATININE  --    < > 11.38*   < > 7.49* 7.91* 4.01*  CALCIUM  --    < > 9.9   < > 9.7 9.3 8.3*  PROT 7.9  --   --   --   --   --   --   ALBUMIN 2.1*   < > 1.9*  --   --  1.8* 1.7*  AST 68*  --   --   --   --   --   --  ALT 21  --   --   --   --   --   --   ALKPHOS 72  --   --   --   --   --   --   BILITOT 0.9  --   --   --   --   --   --   GFRNONAA  --    < > 4*   < > 6* 6* 14*  GFRAA  --    < > 4*   < > 7* 7* 16*  ANIONGAP  --    < > 17*   < > 15 17* 14   < > = values in this interval not displayed.     Hematology Recent Labs  Lab 08/13/19 0306 08/13/19 1020 08/14/19 0509  WBC 39.0* 40.8* 31.3*  RBC 5.61 5.38 5.43  HGB 11.3* 11.3* 11.0*  HCT 34.5* 33.4* 33.3*  MCV 61.5* 62.1* 61.3*  MCH 20.1* 21.0* 20.3*  MCHC 32.8 33.8 33.0  RDW 18.9* 18.0* 18.7*  PLT 438* 463* 404*    Cardiac EnzymesNo results for input(s): TROPONINI in the last 168 hours. No results for input(s): TROPIPOC in the last 168 hours.   BNPNo results for input(s): BNP, PROBNP in the last 168 hours.   DDimer No results for input(s): DDIMER in the last 168 hours.   Radiology    No results found.  Cardiac Studies   ECHO: 08/12/2019 1. Since the last study on on 12/08/2018 aortic stenosis is now severe.  2. Left ventricular ejection fraction, by estimation, is 60 to 65%. The left ventricle has normal function. The left ventricle has no regional wall motion abnormalities. There is mild concentric left ventricular hypertrophy. Left ventricular diastolic  parameters are consistent with Grade I diastolic dysfunction (impaired  relaxation).  3. Right ventricular systolic function is normal. The right ventricular size is normal.  4. The mitral valve is normal in structure. Mild mitral valve  regurgitation. No evidence of mitral stenosis.  5. The aortic valve is normal in structure. Aortic valve regurgitation is not visualized. Severe aortic valve stenosis. Aortic valve mean gradient measures 47.0 mmHg, peak gradient 62.7 mmHg.  Valve area by VTI is 0.63 cm 6. The inferior vena cava is normal in size with greater than 50% respiratory variability, suggesting right atrial pressure of 3 mmHg.   Patient Profile     79 y.o. male with sepsis and nonhealing wound on his left leg.  He is a cardiology patient of Dr. Myles Gip.  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  Other problems include hypertension, diastolic heart failure, prior stroke and PAD.  He did have moderate aortic stenosis by 2D echo in August of last year which has progressed to severe AS during his admission with a valve area of 0.65 cm and increase in his peak gradient.  His white count is 40,000.  He is on IV antibiotics.   Assessment & Plan    BACTEREMIA:  Plan TEE on Monday.  He is on for 0930 on Monday.    AS:  Severe.  Multiple comorbidities.  Ultimately can follow with Dr. Domenic Polite to consider further therapy.    For questions or updates, please contact [place] Please consult [email or web address] for contact info under Cardiology/STEMI.   Signed, Minus Breeding, MD  08/14/2019, 11:52 AM

## 2019-08-14 NOTE — Progress Notes (Signed)
Pt back from HD VS stable and documented in chart, denies pain at this time

## 2019-08-14 NOTE — Progress Notes (Signed)
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 L BKA yesterday . HD yesterday 1L UF . No co this AM, in chair  04/16 0701 - 04/17 0700 In: 766.9 [P.O.:185; I.V.:566.9] Out: 1025 [Blood:25]  Filed Weights   08/13/19 2150 08/14/19 0149 08/14/19 0624  Weight: 65.7 kg 64.6 kg 62.5 kg    Scheduled Meds: . atorvastatin  40 mg Oral Daily  . Chlorhexidine Gluconate Cloth  6 each Topical Q0600  . cinacalcet  60 mg Oral QPM  . docusate sodium  100 mg Oral BID  . doxercalciferol  2.5 mcg Oral Q M,W,F-HD  . heparin  5,000 Units Subcutaneous Q8H  . melatonin  3 mg Oral QHS  . midodrine  10 mg Oral Q M,W,F  . multivitamin  1 tablet Oral QHS  . mupirocin ointment  1 application Nasal BID  . saccharomyces boulardii  250 mg Oral BID  . senna-docusate  1 tablet Oral Daily   Continuous Infusions: . sodium chloride Stopped (08/13/19 1817)  . vancomycin 750 mg (08/14/19 0043)   PRN Meds:.acetaminophen **OR** acetaminophen, albuterol, HYDROmorphone (DILAUDID) injection, oxyCODONE  Current Labs: reviewed    Physical Exam:  Blood pressure (!) 103/50, pulse 73, temperature 98.3 F (36.8 C), temperature source Oral, resp. rate (!) 24, height 5\' 5"  (1.651 m), weight 62.5 kg, SpO2 98 %. chronicall ill apparing LLE AVG +B/T Regular nl s1s2 S/nt/nd L BKA site with wound vac CTAB Mild confusion  Dialysis prescription: Monday/Wednesday/Friday, 4 hours, BFR 400/DFR 800, 2K/2.5 calcium, heparin 6000 units, left femoral loop graft, EDW 66 kg.  [name] mcg 3 times weekly, Sensipar 90 mg daily.  A 1. 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 AMS likely from #2 and #4, improving 4. Enterococcus bacteremia; Vanc, RCID following; TEE 4/19 5. Leukocytosis, improving 6. HTN 7. PVD 8. Hx/o CVA 9. 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 Aortic  stenosis, severe  P . HD again 4/19 . Medication Issues; o Preferred narcotic agents for pain control are hydromorphone, fentanyl, and methadone. Morphine should not be used.  o Baclofen should be avoided o Avoid oral sodium phosphate and magnesium citrate based laxatives / bowel preps    Pearson Grippe MD 08/14/2019, 11:00 AM  Recent Labs  Lab 08/11/19 0306 08/11/19 1130 08/13/19 0306 08/13/19 1020 08/14/19 0509  NA 131*   < > 136 135 137  K 5.3*   < > 3.9 4.4 4.0  CL 92*   < > 95* 96* 98  CO2 22   < > 26 22 25   GLUCOSE 70   < > 76 88 72  BUN 77*   < > 47* 51* 19  CREATININE 11.38*   < > 7.49* 7.91* 4.01*  CALCIUM 9.9   < > 9.7 9.3 8.3*  PHOS 2.5  --   --  3.0 2.1*   < > = values in this interval not displayed.   Recent Labs  Lab 08/12/19 0518 08/12/19 0518 08/13/19 0306 08/13/19 1020 08/14/19 0509  WBC 40.6*   < > 39.0* 40.8* 31.3*  NEUTROABS 35.8*  --  34.2*  --  26.6*  HGB 12.1*   < > 11.3* 11.3* 11.0*  HCT 37.1*   < > 34.5* 33.4* 33.3*  MCV 61.9*   < > 61.5* 62.1*  61.3*  PLT 415*   < > 438* 463* 404*   < > = values in this interval not displayed.

## 2019-08-14 NOTE — Evaluation (Signed)
Physical Therapy Evaluation Patient Details Name: Joseph NAVARRO Sr. MRN: [id number] DOB: [birth year] Today's Date: 08/14/2019   History of Present Illness  Pt is a 79 y/o male here for BKA L LE. PMH of R LE transmetatarsal amp, HTN, arthritis, CVA with residual L sided weakness, arthritis, anxiety, ESRD M/W/F, recent fall with R hip avulasion fx WBAT with no medical mgmt.  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  Clinical Impression  Patient received in bed, lethargic appearing, agrees to PT session. Premedicated prior to session per his request. Patient requires max assist to perform bed mobility to get to edge of bed. Appears to have UE weakness limiting his ability to assist with scooting. Patient required total assist to transfer bed to recliner via squat pivot. He will continue to benefit from skilled PT while here to improve strength, safety and functional independence to reduce caregiver burden.       Follow Up Recommendations SNF;Supervision/Assistance - 24 hour    Equipment Recommendations  Other (comment)(TBD)    Recommendations for Other Services       Precautions / Restrictions Precautions Precautions: Fall Other Brace: residual limb protector, wound vac Restrictions Weight Bearing Restrictions: Yes RLE Weight Bearing: Weight bearing as tolerated LLE Weight Bearing: Non weight bearing      Mobility  Bed Mobility Overal bed mobility: Needs Assistance Bed Mobility: Supine to Sit     Supine to sit: HOB elevated;Max assist     General bed mobility comments: use of bed pad to scoot patient around and forward to sitting at edge of bed.  Transfers Overall transfer level: Needs assistance   Transfers: Squat Pivot Transfers     Squat pivot transfers: Total assist;From elevated surface     General transfer comment: difficulty using UEs effectively to assist with scooting, pivoting, tends to keep R LE off floor  when sitting edge of bed, so unsure if he even used his R LE during transfer  Ambulation/Gait             General Gait Details: unable  Stairs            Wheelchair Mobility    Modified Rankin (Stroke Patients Only)       Balance Overall balance assessment: Needs assistance Sitting-balance support: Bilateral upper extremity supported;Feet unsupported Sitting balance-Leahy Scale: Fair       Standing balance-Leahy Scale: Zero                               Pertinent Vitals/Pain Pain Assessment: Faces Faces Pain Scale: Hurts a little bit Pain Location: L LE Pain Descriptors / Indicators: Aching Pain Intervention(s): Monitored during session;Premedicated before session    Home Living Family/patient expects to be discharged to:: Skilled nursing facility     Type of Home: [place]                Prior Function Level of Independence: Needs assistance   Gait / Transfers Assistance Needed: using RW with assist for mobility, wc for longer distances  (prior to amputation)  ADL's / Homemaking Assistance Needed: some assist with ADLs (LB) from staff at sink   Comments: uses a cane, drives to HD and cares for himself prior to amputations ( 3 weeks ago)     Hand Dominance   Dominant Hand: Right    Extremity/Trunk Assessment   Upper Extremity Assessment  Upper Extremity Assessment: Generalized weakness    Lower Extremity Assessment Lower Extremity Assessment: Generalized weakness LLE Deficits / Details: difficulty using UEs to assist with scooting       Communication   Communication: Expressive difficulties;Other (comment)(wears dentures, does not have them here.)  Cognition Arousal/Alertness: Awake/alert Behavior During Therapy: WFL for tasks assessed/performed Overall Cognitive Status: No family/caregiver present to determine baseline cognitive functioning                                 General Comments:  Patient A & O to place, time, slow to respond at times.      General Comments      Exercises     Assessment/Plan    PT Assessment Patient needs continued PT services  PT Problem List Decreased strength;Decreased mobility;Decreased activity tolerance;Decreased safety awareness       PT Treatment Interventions Gait training;Balance training;Functional mobility training;Therapeutic activities;Patient/family education;DME instruction;Therapeutic exercise;Neuromuscular re-education    PT Goals (Current goals can be found in the Care Plan section)  Acute Rehab PT Goals Patient Stated Goal: none stated PT Goal Formulation: Patient unable to participate in goal setting Time For Goal Achievement: 08/28/19 Potential to Achieve Goals: Fair    Frequency Min 2X/week   Barriers to discharge Decreased caregiver support      Co-evaluation               AM-PAC PT "6 Clicks" Mobility  Outcome Measure Help needed turning from your back to your side while in a flat bed without using bedrails?: A Lot Help needed moving from lying on your back to sitting on the side of a flat bed without using bedrails?: Total Help needed moving to and from a bed to a chair (including a wheelchair)?: Total Help needed standing up from a chair using your arms (e.g., wheelchair or bedside chair)?: Total Help needed to walk in hospital room?: Total Help needed climbing 3-5 steps with a railing? : Total 6 Click Score: 7    End of Session Equipment Utilized During Treatment: Gait belt Activity Tolerance: Patient limited by fatigue Patient left: in chair;with call bell/phone within reach Nurse Communication: Mobility status PT Visit Diagnosis: Other abnormalities of gait and mobility (R26.89);Difficulty in walking, not elsewhere classified (R26.2);Pain;Muscle weakness (generalized) (M62.81) Pain - Right/Left: Left Pain - part of body: Leg    Time: 1015-1035 PT Time Calculation (min) (ACUTE ONLY): 20  min   Charges:   PT Evaluation $PT Eval Moderate Complexity: 1 Mod PT Treatments $Therapeutic Activity: 8-22 mins        Prezley Qadir, PT, GCS 08/14/19,10:53 AM

## 2019-08-14 NOTE — Progress Notes (Signed)
Progress Note  Patient Name: Joseph CALLANDER Sr. Date of Encounter: 08/14/2019  Primary Cardiologist:   Rozann Lesches, MD   Subjective   Denies pain or SOB  Inpatient Medications    Scheduled Meds: . atorvastatin  40 mg Oral Daily  . Chlorhexidine Gluconate Cloth  6 each Topical Q0600  . cinacalcet  60 mg Oral QPM  . docusate sodium  100 mg Oral BID  . doxercalciferol  2.5 mcg Oral Q M,W,F-HD  . heparin  5,000 Units Subcutaneous Q8H  . melatonin  3 mg Oral QHS  . midodrine  10 mg Oral Q M,W,F  . multivitamin  1 tablet Oral QHS  . mupirocin ointment  1 application Nasal BID  . saccharomyces boulardii  250 mg Oral BID  . senna-docusate  1 tablet Oral Daily   Continuous Infusions: . sodium chloride Stopped (08/13/19 1817)  . vancomycin 750 mg (08/14/19 0043)   PRN Meds: acetaminophen **OR** acetaminophen, albuterol, HYDROmorphone (DILAUDID) injection, oxyCODONE   Vital Signs    Vitals:   08/14/19 0149 08/14/19 0240 08/14/19 0624 08/14/19 0839  BP: (!) 158/63 (!) 102/55  (!) 103/50  Pulse: 99 99  73  Resp: (!) 21  (!) 22 (!) 24  Temp: 98.6 F (37 C) 98.5 F (36.9 C)  98.3 F (36.8 C)  TempSrc: Oral Oral  Oral  SpO2: 97% 98%  98%  Weight: 64.6 kg  62.5 kg   Height:        Intake/Output Summary (Last 24 hours) at 08/14/2019 1152 Last data filed at 08/14/2019 0149 Gross per 24 hour  Intake 751.94 ml  Output 1000 ml  Net -248.06 ml   Filed Weights   08/13/19 2150 08/14/19 0149 08/14/19 0624  Weight: 65.7 kg 64.6 kg 62.5 kg    Telemetry    NSR with ventricular and atrial ectopy and sinus tach but no sustained arrhythmias - Personally Reviewed  ECG    NA - Personally Reviewed  Physical Exam   GEN: No acute distress.   Neck: No  JVD Cardiac: RRR, 3/6 apical mid peaking systolic murmur, no diastolic. Respiratory: Clear  to auscultation bilaterally. GI: Soft, nontender, non-distended  MS: No  edema; No deformity. Neuro:  Nonfocal  Psych: Normal  affect   Labs    Chemistry Recent Labs  Lab 08/10/19 0822 08/10/19 1134 08/11/19 0306 08/11/19 1130 08/13/19 0306 08/13/19 1020 08/14/19 0509  NA  --    < > 131*   < > 136 135 137  K  --    < > 5.3*   < > 3.9 4.4 4.0  CL  --    < > 92*   < > 95* 96* 98  CO2  --    < > 22   < > 26 22 25   GLUCOSE  --    < > 70   < > 76 88 72  BUN  --    < > 77*   < > 47* 51* 19  CREATININE  --    < > 11.38*   < > 7.49* 7.91* 4.01*  CALCIUM  --    < > 9.9   < > 9.7 9.3 8.3*  PROT 7.9  --   --   --   --   --   --   ALBUMIN 2.1*   < > 1.9*  --   --  1.8* 1.7*  AST 68*  --   --   --   --   --   --  ALT 21  --   --   --   --   --   --   ALKPHOS 72  --   --   --   --   --   --   BILITOT 0.9  --   --   --   --   --   --   GFRNONAA  --    < > 4*   < > 6* 6* 14*  GFRAA  --    < > 4*   < > 7* 7* 16*  ANIONGAP  --    < > 17*   < > 15 17* 14   < > = values in this interval not displayed.     Hematology Recent Labs  Lab 08/13/19 0306 08/13/19 1020 08/14/19 0509  WBC 39.0* 40.8* 31.3*  RBC 5.61 5.38 5.43  HGB 11.3* 11.3* 11.0*  HCT 34.5* 33.4* 33.3*  MCV 61.5* 62.1* 61.3*  MCH 20.1* 21.0* 20.3*  MCHC 32.8 33.8 33.0  RDW 18.9* 18.0* 18.7*  PLT 438* 463* 404*    Cardiac EnzymesNo results for input(s): TROPONINI in the last 168 hours. No results for input(s): TROPIPOC in the last 168 hours.   BNPNo results for input(s): BNP, PROBNP in the last 168 hours.   DDimer No results for input(s): DDIMER in the last 168 hours.   Radiology    No results found.  Cardiac Studies   ECHO: 08/12/2019 1. Since the last study on on 12/08/2018 aortic stenosis is now severe.  2. Left ventricular ejection fraction, by estimation, is 60 to 65%. The left ventricle has normal function. The left ventricle has no regional wall motion abnormalities. There is mild concentric left ventricular hypertrophy. Left ventricular diastolic  parameters are consistent with Grade I diastolic dysfunction (impaired  relaxation).  3. Right ventricular systolic function is normal. The right ventricular size is normal.  4. The mitral valve is normal in structure. Mild mitral valve  regurgitation. No evidence of mitral stenosis.  5. The aortic valve is normal in structure. Aortic valve regurgitation is not visualized. Severe aortic valve stenosis. Aortic valve mean gradient measures 47.0 mmHg, peak gradient 62.7 mmHg.  Valve area by VTI is 0.63 cm 6. The inferior vena cava is normal in size with greater than 50% respiratory variability, suggesting right atrial pressure of 3 mmHg.   Patient Profile     79 y.o. male with sepsis and nonhealing wound on his left leg.  He is a cardiology patient of Dr. Myles Gip.  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  Other problems include hypertension, diastolic heart failure, prior stroke and PAD.  He did have moderate aortic stenosis by 2D echo in August of last year which has progressed to severe AS during his admission with a valve area of 0.65 cm and increase in his peak gradient.  His white count is 40,000.  He is on IV antibiotics.   Assessment & Plan    BACTEREMIA:  Plan TEE on Monday.  He is on for 0930 on Monday.    AS:  Severe.  Multiple comorbidities.  Ultimately can follow with Dr. Domenic Polite to consider further therapy.    For questions or updates, please contact [place] Please consult [email or web address] for contact info under Cardiology/STEMI.   Signed, Minus Breeding, MD  08/14/2019, 11:52 AM

## 2019-08-15 DIAGNOSIS — L89152 Pressure ulcer of sacral region, stage 2: Secondary | ICD-10-CM

## 2019-08-15 NOTE — Evaluation (Signed)
Occupational Therapy Evaluation Patient Details Name: Joseph BOUILLON Sr. MRN: [id number] DOB: October 15, 1940 Today's Date: 08/15/2019    History of Present Illness Pt is a 79 y/o male here for BKA L LE. PMH of R LE transmetatarsal amp, HTN, arthritis, CVA with residual L sided weakness, arthritis, anxiety, ESRD M/W/F, recent fall with R hip avulasion fx WBAT with no medical mgmt.  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   Clinical Impression   Pt admitted with above diagnoses, presenting with new L BKA. Pt has history of L sided weakness from prior CVA, hand deformities, and R transtarsal amputation that impacts current abilities. Pt able to complete supine <> sit at max A. Pt sat EOB for ~15 minutes without external support. Therapeutic activities given for BLEs, trunk, and BUEs. Pt completed form of chair push up x10 at EOB, Bil leg kicks, and lateral trunk flexion exercises. Also had pt engage in reaching activities to simulate seated BADL tasks.Pt endorses fatigue after activities and wanting to lie back down. Recommend SNF level therapies at d/c. Will continue to to follow per POC listed below.    Follow Up Recommendations  SNF;Supervision/Assistance - 24 hour    Equipment Recommendations  Other (comment)(defer to SNF)    Recommendations for Other Services       Precautions / Restrictions Precautions Precautions: Fall Precaution Comments: old transtarsal amp RLE, new L BKA Required Braces or Orthoses: Other Brace Other Brace: residual limb protector, wound vac Restrictions Weight Bearing Restrictions: Yes RLE Weight Bearing: Weight bearing as tolerated LLE Weight Bearing: Non weight bearing      Mobility Bed Mobility Overal bed mobility: Needs Assistance       Supine to sit: HOB elevated;Max assist     General bed mobility comments: guidance of BLEs to EOB and support at trunk to come up to sitting. Bed pad used to  scoot to EOB  Transfers                      Balance Overall balance assessment: Needs assistance Sitting-balance support: Bilateral upper extremity supported;Feet unsupported Sitting balance-Leahy Scale: Fair                                     ADL either performed or assessed with clinical judgement   ADL Overall ADL's : Needs assistance/impaired Eating/Feeding: Minimal assistance;Sitting Eating/Feeding Details (indicate cue type and reason): occasional difficulty manipulating silverware with hand deformities Grooming: Minimal assistance;Sitting   Upper Body Bathing: Minimal assistance;Sitting   Lower Body Bathing: Maximal assistance;Sitting/lateral leans   Upper Body Dressing : Min guard;Sitting   Lower Body Dressing: Maximal assistance;Sitting/lateral leans   Toilet Transfer: Maximal assistance;+2 for physical assistance Toilet Transfer Details (indicate cue type and reason): lateral scootingscooting Toileting- Clothing Manipulation and Hygiene: Maximal assistance;Sitting/lateral lean               Vision Patient Visual Report: No change from baseline       Perception     Praxis      Pertinent Vitals/Pain Pain Assessment: Faces Faces Pain Scale: Hurts a little bit Pain Location: L LE Pain Descriptors / Indicators: Aching Pain Intervention(s): Monitored during session;Premedicated before session;Repositioned     Hand Dominance     Extremity/Trunk Assessment Upper Extremity Assessment Upper Extremity Assessment: Generalized weakness;RUE deficits/detail;LUE deficits/detail RUE Deficits / Details: finger  amputation, boutonniere deformities- difficulty to use functionally. Reports numbness LUE Deficits / Details: boutonniere deformities- difficulty to use functionally. Reports numbness   Lower Extremity Assessment Lower Extremity Assessment: Defer to PT evaluation;LLE deficits/detail;RLE deficits/detail RLE Deficits / Details: old  transtarsal amp LLE Deficits / Details: s/p BKA       Communication Communication Communication: Expressive difficulties   Cognition Arousal/Alertness: Awake/alert Behavior During Therapy: WFL for tasks assessed/performed Overall Cognitive Status: No family/caregiver present to determine baseline cognitive functioning Area of Impairment: Following commands;Problem solving;Awareness                       Following Commands: Follows one step commands with increased time   Awareness: Emergent Problem Solving: Slow processing;Requires verbal cues;Requires tactile cues General Comments: slow to respond, requiring cues and encouragement   General Comments       Exercises     Shoulder Instructions      Home Living Family/patient expects to be discharged to:: Skilled nursing facility     Type of Home: [place]                                  Prior Functioning/Environment Level of Independence: Needs assistance  Gait / Transfers Assistance Needed: using RW with assist for mobility, wc for longer distances  (prior to amputation) ADL's / Homemaking Assistance Needed: some assist with ADLs (LB) from staff at sink    Comments: uses a cane, drives to HD and cares for himself prior to amputations ( 3 weeks ago)        OT Problem List: Decreased strength;Impaired balance (sitting and/or standing);Decreased knowledge of use of DME or AE;Decreased activity tolerance;Decreased knowledge of precautions;Decreased cognition      OT Treatment/Interventions: Self-care/ADL training;DME and/or AE instruction;Therapeutic activities;Balance training;Therapeutic exercise;Patient/family education    OT Goals(Current goals can be found in the care plan section) Acute Rehab OT Goals Patient Stated Goal: get back to independence OT Goal Formulation: With patient Time For Goal Achievement: 08/29/19 Potential to Achieve Goals: Good  OT Frequency: Min  2X/week   Barriers to D/C:            Co-evaluation              AM-PAC OT "6 Clicks" Daily Activity     Outcome Measure Help from another person eating meals?: A Little Help from another person taking care of personal grooming?: A Little Help from another person toileting, which includes using toliet, bedpan, or urinal?: A Lot Help from another person bathing (including washing, rinsing, drying)?: A Lot Help from another person to put on and taking off regular upper body clothing?: A Little Help from another person to put on and taking off regular lower body clothing?: A Lot 6 Click Score: 15   End of Session Nurse Communication: Mobility status  Activity Tolerance: Patient tolerated treatment well Patient left: in bed;with call bell/phone within reach;with bed alarm set  OT Visit Diagnosis: Other abnormalities of gait and mobility (R26.89);Muscle weakness (generalized) (M62.81)                Time: 7915-0569 OT Time Calculation (min): 18 min Charges:  OT General Charges $OT Visit: 1 Visit OT Evaluation $OT Eval Moderate Complexity: [place], MSOT, OTR/L [facility] Office Number: [phone] Pager: [phone]  Zenovia Jarred 08/15/2019, 1:28 PM

## 2019-08-15 NOTE — Progress Notes (Signed)
Admit: 08/09/2019 LOS: 5  62M ESRD MWF, admit with AMS and nonhealing L TMA; L BKA 4/16  Subjective:  . No c/o  No intake/output data recorded.  Filed Weights   08/14/19 0149 08/14/19 0624 08/15/19 0350  Weight: 64.6 kg 62.5 kg 63.4 kg    Scheduled Meds: . atorvastatin  40 mg Oral Daily  . Chlorhexidine Gluconate Cloth  6 each Topical Q0600  . cinacalcet  60 mg Oral QPM  . docusate sodium  100 mg Oral BID  . doxercalciferol  2.5 mcg Oral Q M,W,F-HD  . heparin  5,000 Units Subcutaneous Q8H  . melatonin  3 mg Oral QHS  . midodrine  10 mg Oral Q M,W,F  . multivitamin  1 tablet Oral QHS  . mupirocin ointment  1 application Nasal BID  . saccharomyces boulardii  250 mg Oral BID  . senna-docusate  1 tablet Oral Daily   Continuous Infusions: . sodium chloride Stopped (08/13/19 1817)  . vancomycin 750 mg (08/14/19 0043)   PRN Meds:.acetaminophen **OR** acetaminophen, albuterol, HYDROmorphone (DILAUDID) injection, oxyCODONE  Current Labs: reviewed    Physical Exam:  Blood pressure 109/61, pulse 95, temperature (!) 97.5 F (36.4 C), temperature source Oral, resp. rate 20, height 5\' 5"  (1.651 m), weight 63.4 kg, SpO2 100 %. chronicall ill apparing LLE AVG +B/T Regular nl s1s2 S/nt/nd L BKA site with wound vac CTAB Mild confusion  Dialysis prescription: Monday/Wednesday/Friday, 4 hours, BFR 400/DFR 800, 2K/2.5 calcium, heparin 6000 units, left femoral loop graft, EDW 66 kg.  [name] mcg 3 times weekly, Sensipar 90 mg daily.  A 1. ESRD MWF [facility] LUE AVG 2. 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 AMS likely from #2 and #4, improving 4. Enterococcus bacteremia; Vanc, RCID following; TEE 4/19 5. Leukocytosis, improving 6. HTN 7. PVD 8. Hx/o CVA 9. 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 Aortic stenosis, severe  P . HD again 4/19: 3.5h, 400/800 LLE AVG no heparin, 2L UF . Medication  Issues; o Preferred narcotic agents for pain control are hydromorphone, fentanyl, and methadone. Morphine should not be used.  o Baclofen should be avoided o Avoid oral sodium phosphate and magnesium citrate based laxatives / bowel preps    Pearson Grippe MD 08/15/2019, 8:58 AM  Recent Labs  Lab 08/11/19 0306 08/11/19 1130 08/13/19 0306 08/13/19 1020 08/14/19 0509  NA 131*   < > 136 135 137  K 5.3*   < > 3.9 4.4 4.0  CL 92*   < > 95* 96* 98  CO2 22   < > 26 22 25   GLUCOSE 70   < > 76 88 72  BUN 77*   < > 47* 51* 19  CREATININE 11.38*   < > 7.49* 7.91* 4.01*  CALCIUM 9.9   < > 9.7 9.3 8.3*  PHOS 2.5  --   --  3.0 2.1*   < > = values in this interval not displayed.   Recent Labs  Lab 08/12/19 0518 08/12/19 0518 08/13/19 0306 08/13/19 1020 08/14/19 0509  WBC 40.6*   < > 39.0* 40.8* 31.3*  NEUTROABS 35.8*  --  34.2*  --  26.6*  HGB 12.1*   < > 11.3* 11.3* 11.0*  HCT 37.1*   < > 34.5* 33.4* 33.3*  MCV 61.9*   < > 61.5* 62.1* 61.3*  PLT 415*   < > 438* 463* 404*   < > =  values in this interval not displayed.

## 2019-08-15 NOTE — Progress Notes (Signed)
PROGRESS NOTE  Joseph L Calamari Sr. MRN:[id number] DOB: 06/22/1940   PCP: Fanta, Tesfaye, MD  Patient is from: Home  DOA: 08/09/2019 LOS: 5  Brief Narrative / Interim history: 79-year-old male with history of ESRD on HD MWF, diastolic CHF, moderate AS, PVD, HTN, IDA and bilateral transmetatarsal amputation brought to ED due to altered mental status during dialysis.  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  Admitted for sepsis in the setting of postoperative left foot infection and found to have Enterococcus bacteremia.  ID, orthopedic surgery (Dr. Duda) and nephrology following. Patient underwent left transtibial amputation by Dr. Duda on 4/16. Repeat blood culture negative.  TTE without vegetation but severe aortic stenosis.  Cardiology consulted for TEE and evaluation of aortic stenosis.  TEE tentatively scheduled for 4/19.   Subjective: Seen and examined earlier this morning.  No major events overnight of this morning.  No complaint this morning.  Pain fairly controlled.  He denies chest pain, dyspnea or GI symptoms.  Objective: Vitals:   08/14/19 1748 08/14/19 1957 08/15/19 0350 08/15/19 0910  BP: 118/60 107/61 109/61 (!) 101/52  Pulse: 98 (!) 107 95 95  Resp: 13 19 20 16  Temp: 98 F (36.7 C) 99.1 F (37.3 C) (!) 97.5 F (36.4 C) 98.7 F (37.1 C)  TempSrc: Oral Oral Oral Oral  SpO2: 97% 91% 100% 93%  Weight:   63.4 kg   Height:       No intake or output data in the 24 hours ending 08/15/19 1034 Filed Weights   08/14/19 0149 08/14/19 0624 08/15/19 0350  Weight: 64.6 kg 62.5 kg 63.4 kg    Examination:  GENERAL: No apparent distress.  Nontoxic. HEENT: MMM.  Vision and hearing grossly intact.  NECK: Supple.  No apparent JVD.  RESP: On room air.  No IWOB.  Fair aeration bilaterally. CVS:  RRR.  2/6 SEM all over. ABD/GI/GU: Bowel sounds present. Soft. Non tender.  MSK/EXT:  Moves extremities.  Right transmetatarsal amputation.   Limb protector and stump shrinker over LLE.  No drainage in wound VAC canister. SKIN: Skin wound over the plantar aspect of right foot without signs of infection.  Stage I sacral decubitus NEURO: Sleepy but wakes to voice easily.  Oriented fairly and follows commands.  No apparent focal neuro deficit. PSYCH: Calm. Normal affect.   Procedures:  4/16-left transtibial amputation with wound VAC placement  Microbiology summarized: 4/13-COVID-19 PCR negative. 4/13-blood cultures with GPC.  BICD with Enterococcus. 4/14-blood cultures NGTD  Assessment & Plan: Sepsis due to Enterococcus bacteremia  Postoperative left foot wound infection-patient had left transmetatarsal amputation on 3/24. -CRP and ESR mildly elevated. -Underwent left transtibial amputation on 4/16 by Dr. Duda. -Repeat blood culture on 4/14 NGTD -TTE without vegetation but severe aortic stenosis -TEE tentatively scheduled for 4/19. -Continue vancomycin per ID. -Trend leukocytosis-improving.  Acute metabolic encephalopathy: Likely due to the above.  CT head without acute finding.  No focal neuro deficit.  Seems to have resolved.  He is fairly oriented. -Manage sepsis/bacteremia as above. -Frequent reorientation and delirium precautions.  ESRD on HD M/W/F at the [facility] Bone marrow disorder Azotemia/hyperkalemia/hyponatremia/Anion gap metabolic acidosis-resolved. Anemia of renal disease-stable. -Nephrology managing  Severe aortic stenosis/chronic diastolic CHF: No cardiopulmonary symptoms.  Appears euvolemic. -Cardiology consulted-recommended outpatient follow-up.  TEE on 4/19 for bacteremia. -Fluid management by dialysis -Strict intake and output  Hypotension: Normotensive -Continue midodrine on dialysis days  Peripheral vascular disease:  -We will resume Plavix   after TEE.  History of nonhemorrhagic CVA in 2000: No apparent neuro deficit on exam. -Continue home medications.  Will resume Plavix  after TEE.  Hyperlipidemia:  -Continue home atorvastatin  Bandemia/thrombocytosis: Likely reactive from #1.  Improving. -Continue monitoring  Debility -PT/OT recommended SNF.              Pressure injury: not POA.  Stage I-II Pressure Injury 08/15/19 Sacrum (Active)  08/15/19 0300  Location: Sacrum  Location Orientation:   Staging:   Wound Description (Comments):   Present on Admission:      DVT prophylaxis: Subcu heparin Code Status: Full code Family Communication: None at bedside.  Updated patient's daughter over the phone.  Discharge barrier: Evaluation and treatment of Enterococcus bacteremia/postoperative left foot infection on IV antibiotics Patient is from: Home Final disposition: Likely SNF in the next 2 to 3 days after TEE and clearance by Ortho, ID and nephrology.  Consultants:  Orthopedic surgery, Dr. Duda Infectious disease Nephrology   Sch Meds:  Scheduled Meds: . atorvastatin  40 mg Oral Daily  . Chlorhexidine Gluconate Cloth  6 each Topical Q0600  . cinacalcet  60 mg Oral QPM  . docusate sodium  100 mg Oral BID  . doxercalciferol  2.5 mcg Oral Q M,W,F-HD  . heparin  5,000 Units Subcutaneous Q8H  . melatonin  3 mg Oral QHS  . midodrine  10 mg Oral Q M,W,F  . multivitamin  1 tablet Oral QHS  . mupirocin ointment  1 application Nasal BID  . saccharomyces boulardii  250 mg Oral BID  . senna-docusate  1 tablet Oral Daily   Continuous Infusions: . sodium chloride Stopped (08/13/19 1817)  . vancomycin 750 mg (08/14/19 0043)   PRN Meds:.acetaminophen **OR** acetaminophen, albuterol, HYDROmorphone (DILAUDID) injection, oxyCODONE  Antimicrobials: Anti-infectives (From admission, onward)   Start     Dose/Rate Route Frequency Ordered Stop   08/13/19 2200  ceFAZolin (ANCEF) IVPB 1 g/50 mL premix     1 g 100 mL/hr over 30 Minutes Intravenous  Once 08/13/19 0947 08/14/19 0321   08/13/19 0738  ceFAZolin (ANCEF) 2-4 GM/100ML-% IVPB    Note to  Pharmacy: Jiang, Fudan   : cabinet override      08/13/19 0738 08/13/19 1944   08/11/19 1200  vancomycin (VANCOCIN) IVPB 750 mg/150 ml premix     750 mg 150 mL/hr over 60 Minutes Intravenous Every M-W-F (Hemodialysis) 08/11/19 0903     08/11/19 1000  ampicillin (OMNIPEN) 2 g in sodium chloride 0.9 % 100 mL IVPB  Status:  Discontinued     2 g 300 mL/hr over 20 Minutes Intravenous Every 12 hours 08/11/19 0405 08/11/19 0857   08/11/19 0915  vancomycin (VANCOREADY) IVPB 1250 mg/250 mL  Status:  Discontinued     1,250 mg 166.7 mL/hr over 90 Minutes Intravenous  Once 08/11/19 0903 08/11/19 0907   08/11/19 0915  ceFAZolin (ANCEF) IVPB 2g/100 mL premix     2 g 200 mL/hr over 30 Minutes Intravenous On call to O.R. 08/11/19 0909 08/12/19 0559   08/11/19 0000  vancomycin variable dose per unstable renal function (pharmacist dosing)  Status:  Discontinued      Does not apply See admin instructions 08/10/19 1108 08/11/19 0405   08/10/19 1600  piperacillin-tazobactam (ZOSYN) IVPB 2.25 g  Status:  Discontinued     2.25 g 100 mL/hr over 30 Minutes Intravenous Every 8 hours 08/10/19 1108 08/11/19 0405   08/10/19 1115  vancomycin (VANCOREADY) IVPB 500 mg/100 mL       500 mg 100 mL/hr over 60 Minutes Intravenous NOW 08/10/19 1105 08/10/19 1435   08/10/19 0745  piperacillin-tazobactam (ZOSYN) IVPB 3.375 g     3.375 g 100 mL/hr over 30 Minutes Intravenous  Once 08/10/19 0732 08/10/19 0935   08/10/19 0745  vancomycin (VANCOCIN) IVPB 1000 mg/200 mL premix     1,000 mg 200 mL/hr over 60 Minutes Intravenous  Once 08/10/19 0732 08/10/19 0959       I have personally reviewed the following labs and images: CBC: Recent Labs  Lab 08/11/19 1130 08/12/19 0518 08/13/19 0306 08/13/19 1020 08/14/19 0509  WBC 38.1* 40.6* 39.0* 40.8* 31.3*  NEUTROABS  --  35.8* 34.2*  --  26.6*  HGB 11.5* 12.1* 11.3* 11.3* 11.0*  HCT 34.2* 37.1* 34.5* 33.4* 33.3*  MCV 60.7* 61.9* 61.5* 62.1* 61.3*  PLT 362 415* 438* 463* 404*    BMP &GFR Recent Labs  Lab 08/10/19 1134 08/10/19 1134 08/11/19 0306 08/11/19 0306 08/11/19 1130 08/11/19 2021 08/13/19 0306 08/13/19 1020 08/14/19 0509  NA 133*   < > 131*   < > 132* 136 136 135 137  K 4.9   < > 5.3*   < > 5.5* 3.7 3.9 4.4 4.0  CL 93*   < > 92*   < > 92* 96* 95* 96* 98  CO2 25   < > 22   < > 20* 27 26 22 25  GLUCOSE 113*   < > 70   < > 70 97 76 88 72  BUN 69*   < > 77*   < > 81* 27* 47* 51* 19  CREATININE 10.75*   < > 11.38*   < > 11.57* 5.43* 7.49* 7.91* 4.01*  CALCIUM 9.7   < > 9.9   < > 9.8 9.1 9.7 9.3 8.3*  MG  --   --   --   --   --  1.9 2.0  --  1.9  PHOS 2.8  --  2.5  --   --   --   --  3.0 2.1*   < > = values in this interval not displayed.   Estimated Creatinine Clearance: 13.6 mL/min (A) (by C-G formula based on SCr of 4.01 mg/dL (H)). Liver & Pancreas: Recent Labs  Lab 08/10/19 0822 08/10/19 1134 08/11/19 0306 08/13/19 1020 08/14/19 0509  AST 68*  --   --   --   --   ALT 21  --   --   --   --   ALKPHOS 72  --   --   --   --   BILITOT 0.9  --   --   --   --   PROT 7.9  --   --   --   --   ALBUMIN 2.1* 1.8* 1.9* 1.8* 1.7*   Recent Labs  Lab 08/10/19 0822  LIPASE 20   No results for input(s): AMMONIA in the last 168 hours. Diabetic: No results for input(s): HGBA1C in the last 72 hours. Recent Labs  Lab 08/10/19 0652 08/13/19 0833  GLUCAP 114* 86   Cardiac Enzymes: No results for input(s): CKTOTAL, CKMB, CKMBINDEX, TROPONINI in the last 168 hours. No results for input(s): PROBNP in the last 8760 hours. Coagulation Profile: No results for input(s): INR, PROTIME in the last 168 hours. Thyroid Function Tests: No results for input(s): TSH, T4TOTAL, FREET4, T3FREE, THYROIDAB in the last 72 hours. Lipid Profile: No results for input(s): CHOL, HDL, LDLCALC, TRIG, CHOLHDL, LDLDIRECT in the last   72 hours. Anemia Panel: No results for input(s): VITAMINB12, FOLATE, FERRITIN, TIBC, IRON, RETICCTPCT in the last 72 hours. Urine  analysis: No results found for: COLORURINE, APPEARANCEUR, LABSPEC, PHURINE, GLUCOSEU, HGBUR, BILIRUBINUR, KETONESUR, PROTEINUR, UROBILINOGEN, NITRITE, LEUKOCYTESUR Sepsis Labs: Invalid input(s): PROCALCITONIN, [place]  Microbiology: Recent Results (from the past 240 hour(s))  Blood culture (routine x 2)     Status: Abnormal   Collection Time: 08/10/19  4:30 AM   Specimen: BLOOD LEFT ARM  Result Value Ref Range Status   Specimen Description BLOOD LEFT ARM  Final   Special Requests   Final    BOTTLES DRAWN AEROBIC AND ANAEROBIC Blood Culture adequate volume   Culture  Setup Time   Final    IN BOTH AEROBIC AND ANAEROBIC BOTTLES GRAM POSITIVE COCCI CRITICAL RESULT CALLED TO, READ BACK BY AND VERIFIED WITH: G. ABBOTT,PHARMD 1194 08/11/2019 T. TYSOR    Culture (A)  Final    ENTEROCOCCUS FAECALIS SUSCEPTIBILITIES PERFORMED ON PREVIOUS CULTURE WITHIN THE LAST 5 DAYS. Performed at [facility], [place] [address], [place], [place] [address]    Report Status 08/13/2019 FINAL  Final  Blood Culture ID Panel (Reflexed)     Status: Abnormal   Collection Time: 08/10/19  4:30 AM  Result Value Ref Range Status   Enterococcus species DETECTED (A) NOT DETECTED Final    Comment: CRITICAL RESULT CALLED TO, READ BACK BY AND VERIFIED WITH: G. ABBOTT,PHARMD 0248 08/11/2019 T. TYSOR    Vancomycin resistance NOT DETECTED NOT DETECTED Final   Listeria monocytogenes NOT DETECTED NOT DETECTED Final   Staphylococcus species NOT DETECTED NOT DETECTED Final   Staphylococcus aureus (BCID) NOT DETECTED NOT DETECTED Final   Streptococcus species NOT DETECTED NOT DETECTED Final   Streptococcus agalactiae NOT DETECTED NOT DETECTED Final   Streptococcus pneumoniae NOT DETECTED NOT DETECTED Final   Streptococcus pyogenes NOT DETECTED NOT DETECTED Final   Acinetobacter baumannii NOT DETECTED NOT DETECTED Final   Enterobacteriaceae species NOT DETECTED NOT DETECTED Final   Enterobacter cloacae complex NOT  DETECTED NOT DETECTED Final   Escherichia coli NOT DETECTED NOT DETECTED Final   Klebsiella oxytoca NOT DETECTED NOT DETECTED Final   Klebsiella pneumoniae NOT DETECTED NOT DETECTED Final   Proteus species NOT DETECTED NOT DETECTED Final   Serratia marcescens NOT DETECTED NOT DETECTED Final   Haemophilus influenzae NOT DETECTED NOT DETECTED Final   Neisseria meningitidis NOT DETECTED NOT DETECTED Final   Pseudomonas aeruginosa NOT DETECTED NOT DETECTED Final   Candida albicans NOT DETECTED NOT DETECTED Final   Candida glabrata NOT DETECTED NOT DETECTED Final   Candida krusei NOT DETECTED NOT DETECTED Final   Candida parapsilosis NOT DETECTED NOT DETECTED Final   Candida tropicalis NOT DETECTED NOT DETECTED Final    Comment: Performed at [facility], [address], [place], [place] [address]  Blood culture (routine x 2)     Status: Abnormal   Collection Time: 08/10/19  4:55 AM   Specimen: BLOOD RIGHT ARM  Result Value Ref Range Status   Specimen Description BLOOD RIGHT ARM  Final   Special Requests   Final    BOTTLES DRAWN AEROBIC ONLY Blood Culture adequate volume   Culture  Setup Time   Final    AEROBIC BOTTLE ONLY GRAM POSITIVE COCCI CRITICAL VALUE NOTED.  VALUE IS CONSISTENT WITH PREVIOUSLY REPORTED AND CALLED VALUE. Performed at [facility], [place] [address], [place], [place] [address]    Culture ENTEROCOCCUS FAECALIS (A)  Final  Report Status 08/13/2019 FINAL  Final   Organism ID, Bacteria ENTEROCOCCUS FAECALIS  Final      Susceptibility   Enterococcus faecalis - MIC*    AMPICILLIN <=2 SENSITIVE Sensitive     VANCOMYCIN <=0.5 SENSITIVE Sensitive     GENTAMICIN SYNERGY SENSITIVE Sensitive     * ENTEROCOCCUS FAECALIS  SARS CORONAVIRUS 2 (TAT 6-24 HRS) Nasopharyngeal Nasopharyngeal Swab     Status: None   Collection Time: 08/10/19  9:59 AM   Specimen: Nasopharyngeal Swab  Result Value Ref Range Status   SARS Coronavirus 2 NEGATIVE NEGATIVE Final    Comment:  (NOTE) SARS-CoV-2 target nucleic acids are NOT DETECTED. The SARS-CoV-2 RNA is generally detectable in upper and lower respiratory specimens during the acute phase of infection. Negative results do not preclude SARS-CoV-2 infection, do not rule out co-infections with other pathogens, and should not be used as the sole basis for treatment or other patient management decisions. Negative results must be combined with clinical observations, patient history, and epidemiological information. The expected result is Negative. Fact Sheet for Patients: SugarRoll.be Fact Sheet for Healthcare Providers: https://www.woods-mathews.com/ This test is not yet approved or cleared by the [place] FDA and  has been authorized for detection and/or diagnosis of SARS-CoV-2 by FDA under an Emergency Use Authorization (EUA). This EUA will remain  in effect (meaning this test can be used) for the duration of the COVID-19 declaration under Section 56 4(b)(1) of the Act, 21 U.S.C. section 360bbb-3(b)(1), unless the authorization is terminated or revoked sooner. Performed at [facility], [place] [address] [place], [place] [address]   Culture, blood (routine x 2)     Status: None (Preliminary result)   Collection Time: 08/11/19 10:36 AM   Specimen: BLOOD RIGHT HAND  Result Value Ref Range Status   Specimen Description BLOOD RIGHT HAND  Final   Special Requests   Final    BOTTLES DRAWN AEROBIC ONLY Blood Culture adequate volume   Culture   Final    NO GROWTH 4 DAYS Performed at [facility], [place] [address] [place], [place] [address]    Report Status PENDING  Incomplete  Culture, blood (routine x 2)     Status: None (Preliminary result)   Collection Time: 08/11/19 10:39 AM   Specimen: BLOOD RIGHT HAND  Result Value Ref Range Status   Specimen Description BLOOD RIGHT HAND  Final   Special Requests   Final    BOTTLES DRAWN AEROBIC ONLY Blood Culture  results may not be optimal due to an inadequate volume of blood received in culture bottles   Culture   Final    NO GROWTH 4 DAYS Performed at [facility], [place] [address], [place], [place] [address]    Report Status PENDING  Incomplete    Radiology Studies: No results found.    [name]  If 7PM-7AM, please contact night-coverage [email or web address] Password [facility] 08/15/2019, 10:34 AM

## 2019-08-15 NOTE — NC FL2 (Signed)
[place] LEVEL OF CARE SCREENING TOOL     IDENTIFICATION  Patient Name: Joseph WIDRIG Sr. Birthdate: 21-Oct-1940 Sex: male Admission Date (Current Location): 08/09/2019  [facility] and [place] Number:  [employer] and Address:  [facility], [place] [address] [place], [place] [address]      Provider Number: [id number]  Attending Physician Name and Address:  Mercy Riding, MD  Relative Name and Phone Number:       Current Level of Care: Hospital Recommended Level of Care: [place] Prior Approval Number:    Date Approved/Denied:   PASRR Number: [id number]  Discharge Plan: SNF    Current Diagnoses: Patient Active Problem List   Diagnosis Date Noted  . Bacteremia due to Enterococcus 08/11/2019  . Left foot infection   . Postoperative wound infection 08/10/2019  . Prolonged QT interval 08/10/2019  . Acute metabolic encephalopathy 02/20/8526  . Thrombocytosis ([place]) 08/10/2019  . Hyperkalemia 08/10/2019  . Acute osteomyelitis of left foot ([place]) 07/21/2019  . Fall   . Closed avulsion fracture of right hip ([place]) 06/30/2019  . Closed intertrochanteric fracture of right hip ([place]) 06/30/2019  . Hip fracture ([place]) 06/30/2019  . Severe protein-calorie malnutrition ([place]) 06/18/2018  . Severe sepsis ([place]) 06/12/2018  . Leukocytosis 06/12/2018  . MRSA (methicillin resistant staph aureus) culture positive 06/12/2018  . Essential hypertension 06/12/2018  . ESRD (end stage renal disease) ([place]) 06/09/2018  . Gangrene of left foot ([place]) 06/04/2018  . S/P transmetatarsal amputation of foot, right ([place]) 05/09/2017  . PVD (peripheral vascular disease) ([place]) 04/15/2017  . Nonischemic cardiomyopathy ([place]) 08/15/2015  . Aortic stenosis 08/15/2015  . Moderate aortic stenosis 07/12/2015  . 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 CAD- 40-50% LAD at cath 07/11/15 07/11/2015  . Aspirin intolerance 07/11/2015  . Abnormal  stress test   . ESRD on dialysis ([place]) 05/30/2015  . CTS (carpal tunnel syndrome) 02/28/2015  . PVD of LE - Dr Oneida Alar follows 08/20/2012  . Midfoot skin ulcer, right, limited to breakdown of skin ([place]) 01/16/2012  . Encounter for screening colonoscopy 10/08/2011  . Melena 10/08/2011  . Other complications due to renal dialysis device, implant, and graft 09/24/2011    Orientation RESPIRATION BLADDER Height & Weight     Self, Time, Situation, Place  Normal Continent Weight: 139 lb 12.4 oz (63.4 kg) Height:  5\' 5"  (165.1 cm)  BEHAVIORAL SYMPTOMS/MOOD NEUROLOGICAL BOWEL NUTRITION STATUS      Continent Diet(see DC summary)  AMBULATORY STATUS COMMUNICATION OF NEEDS Skin   Extensive Assist Verbally Wound Vac, PU Stage and Appropriate Care(prevena wound vac, continued until 08/20/19) PU Stage 1 Dressing: (sacrum, foam dressing; lift every shift to assess, change every 3 days)                     Personal Care Assistance Level of Assistance  Bathing, Feeding, Dressing Bathing Assistance: Maximum assistance Feeding assistance: Limited assistance Dressing Assistance: Maximum assistance     Functional Limitations Info  Speech     Speech Info: Impaired(garbled)    SPECIAL CARE FACTORS FREQUENCY  PT (By licensed PT), OT (By licensed OT)     PT Frequency: 5x/wk OT Frequency: 5x/wk            Contractures Contractures Info: Not present    Additional Factors Info  Code Status, Allergies Code Status Info: Full Allergies Info: Aspirin           Current  Medications (08/15/2019):  This is the current hospital active medication list Current Facility-Administered Medications  Medication Dose Route Frequency Provider Last Rate Last Admin  . 0.9 %  sodium chloride infusion   Intravenous Continuous Persons, Bevely Palmer, [place]   Stopped at 08/13/19 1817  . acetaminophen (TYLENOL) tablet 650 mg  650 mg Oral Q6H PRN Persons, Bevely Palmer, [place]   650 mg at 08/11/19 2031   Or  . acetaminophen  (TYLENOL) suppository 650 mg  650 mg Rectal Q6H PRN Persons, Bevely Palmer, PA      . albuterol (PROVENTIL) (2.5 MG/3ML) 0.083% nebulizer solution 2.5 mg  2.5 mg Nebulization Q6H PRN Persons, Bevely Palmer, PA      . atorvastatin (LIPITOR) tablet 40 mg  40 mg Oral Daily Persons, Bevely Palmer, PA   40 mg at 08/15/19 0924  . Chlorhexidine Gluconate Cloth 2 % PADS 6 each  6 each Topical Q0600 Persons, Bevely Palmer, [place]   6 each at 08/15/19 [phone]  . cinacalcet (SENSIPAR) tablet 60 mg  60 mg Oral QPM Persons, Bevely Palmer, PA   60 mg at 08/14/19 1719  . docusate sodium (COLACE) capsule 100 mg  100 mg Oral BID Persons, Bevely Palmer, PA   100 mg at 08/15/19 8242  . doxercalciferol ([name]) capsule 2.5 mcg  2.5 mcg Oral Q M,W,F-HD Persons, Bevely Palmer, PA   2.5 mcg at 08/13/19 1335  . heparin injection 5,000 Units  5,000 Units Subcutaneous Q8H Persons, Bevely Palmer, PA   5,000 Units at 08/15/19 0513  . HYDROmorphone (DILAUDID) injection 0.5 mg  0.5 mg Intravenous Q4H PRN Persons, Bevely Palmer, PA   0.5 mg at 08/14/19 0001  . melatonin tablet 3 mg  3 mg Oral QHS Persons, Bevely Palmer, [place]   3 mg at 08/14/19 2136  . midodrine (PROAMATINE) tablet 10 mg  10 mg Oral Q M,W,F Persons, Bevely Palmer, PA   10 mg at 08/13/19 1038  . multivitamin (RENA-VIT) tablet 1 tablet  1 tablet Oral QHS Persons, Bevely Palmer, [place]   1 tablet at 08/14/19 2136  . mupirocin ointment (BACTROBAN) 2 % 1 application  1 application Nasal BID Persons, Bevely Palmer, [place]   1 application at 35/36/14 [phone]  . oxyCODONE (Oxy IR/ROXICODONE) immediate release tablet 5-10 mg  5-10 mg Oral Q4H PRN Persons, Bevely Palmer, PA   5 mg at 08/14/19 1718  . saccharomyces boulardii (FLORASTOR) capsule 250 mg  250 mg Oral BID Persons, Bevely Palmer, [place]   250 mg at 08/15/19 4008  . senna-docusate (Senokot-S) tablet 1 tablet  1 tablet Oral Daily Persons, Bevely Palmer, [place]   1 tablet at 08/15/19 [phone]  . vancomycin (VANCOCIN) IVPB 750 mg/150 ml premix  750 mg Intravenous Q M,W,F-HD Persons, Bevely Palmer, PA 150 mL/hr at  08/14/19 0043 750 mg at 08/14/19 0043     Discharge Medications: Please see discharge summary for a list of discharge medications.  Relevant Imaging Results:  Relevant Lab Results:   Additional Information SS#: [id number]; HD TTS at [place] in [facility], [place]

## 2019-08-15 NOTE — Anesthesia Preprocedure Evaluation (Addendum)
Anesthesia Evaluation  Patient identified by MRN, date of birth, ID band Patient confused    Reviewed: Allergy & Precautions, NPO status , Patient's Chart, lab work & pertinent test results, Unable to perform ROS - Chart review only  History of Anesthesia Complications Negative for: history of anesthetic complications  Airway       Comment:  Unable to perform airway exam due to patient's mental status  Dental   Pulmonary former smoker,    Pulmonary exam normal        Cardiovascular hypertension, + CAD and + Peripheral Vascular Disease  + Valvular Problems/Murmurs AS  Rhythm:Regular Rate:Normal + Systolic murmurs  '21 TTE - Since the last study on on 12/08/2018 aortic stenosis is now severe. EF 60 to 65%. Mild concentric left ventricular hypertrophy. Grade I diastolic dysfunction (impaired relaxation). Mild MR. Severe AS. Aortic valve mean gradient measures 47.0 mmHg.     Neuro/Psych PSYCHIATRIC DISORDERS Anxiety CVA, Residual Symptoms    GI/Hepatic PUD,   Endo/Other    Renal/GU ESRF and DialysisRenal disease     Musculoskeletal  (+) Arthritis ,   Abdominal   Peds  Hematology  (+) anemia ,  Leukocytosis, WBC > 30k    Anesthesia Other Findings Covid neg 4/13  Reproductive/Obstetrics                            Anesthesia Physical Anesthesia Plan  ASA: IV  Anesthesia Plan: MAC   Post-op Pain Management:    Induction: Intravenous  PONV Risk Score and Plan: 1 and Propofol infusion and Treatment may vary due to age or medical condition  Airway Management Planned: Nasal Cannula and Natural Airway  Additional Equipment: None  Intra-op Plan:   Post-operative Plan:   Informed Consent: I have reviewed the patients History and Physical, chart, labs and discussed the procedure including the risks, benefits and alternatives for the proposed anesthesia with the patient or authorized  representative who has indicated his/her understanding and acceptance.     Consent reviewed with POA  Plan Discussed with: CRNA and Anesthesiologist  Anesthesia Plan Comments: (Consent received from daughter Hortense Ramal via telephone)       Anesthesia Quick Evaluation

## 2019-08-16 ENCOUNTER — Inpatient Hospital Stay (HOSPITAL_COMMUNITY): Payer: Medicare Other

## 2019-08-16 ENCOUNTER — Encounter (HOSPITAL_COMMUNITY)
Admission: EM | Disposition: A | Discharge status: Skilled Nursing Facility | Payer: Self-pay | Source: Home / Self Care | Attending: Student

## 2019-08-16 ENCOUNTER — Encounter (HOSPITAL_COMMUNITY): Payer: Self-pay | Admitting: Internal Medicine

## 2019-08-16 ENCOUNTER — Inpatient Hospital Stay (HOSPITAL_COMMUNITY): Payer: Medicare Other | Admitting: Anesthesiology

## 2019-08-16 DIAGNOSIS — Z899 Acquired absence of limb, unspecified: Secondary | ICD-10-CM

## 2019-08-16 DIAGNOSIS — D72829 Elevated white blood cell count, unspecified: Secondary | ICD-10-CM

## 2019-08-16 DIAGNOSIS — I352 Nonrheumatic aortic (valve) stenosis with insufficiency: Secondary | ICD-10-CM

## 2019-08-16 DIAGNOSIS — Z978 Presence of other specified devices: Secondary | ICD-10-CM

## 2019-08-16 HISTORY — PX: TEE WITHOUT CARDIOVERSION: SHX5443

## 2019-08-16 HISTORY — PX: BUBBLE STUDY: SHX6837

## 2019-08-16 LAB — CULTURE, BLOOD (ROUTINE X 2)
Culture: NO GROWTH
Culture: NO GROWTH
Special Requests: ADEQUATE

## 2019-08-16 LAB — CBC
HCT: 33.2 % — ABNORMAL LOW (ref 39.0–52.0)
Hemoglobin: 10.9 g/dL — ABNORMAL LOW (ref 13.0–17.0)
MCH: 20.4 pg — ABNORMAL LOW (ref 26.0–34.0)
MCHC: 32.8 g/dL (ref 30.0–36.0)
MCV: 62.1 fL — ABNORMAL LOW (ref 80.0–100.0)
Platelets: 381 10*3/uL (ref 150–400)
RBC: 5.35 MIL/uL (ref 4.22–5.81)
RDW: 18.9 % — ABNORMAL HIGH (ref 11.5–15.5)
WBC: 32.7 10*3/uL — ABNORMAL HIGH (ref 4.0–10.5)
nRBC: 0.1 % (ref 0.0–0.2)

## 2019-08-16 LAB — SURGICAL PATHOLOGY

## 2019-08-16 SURGERY — ECHOCARDIOGRAM, TRANSESOPHAGEAL
Anesthesia: Monitor Anesthesia Care

## 2019-08-16 MED ORDER — SODIUM CHLORIDE 0.9 % IV SOLN: INTRAVENOUS | Status: DC

## 2019-08-16 MED ORDER — LIDOCAINE 2% (20 MG/ML) 5 ML SYRINGE
INTRAMUSCULAR | Status: DC | PRN
  Administered 2019-08-16: 40 mg via INTRAVENOUS

## 2019-08-16 MED ORDER — PROPOFOL 10 MG/ML IV BOLUS
INTRAVENOUS | Status: DC | PRN
  Administered 2019-08-16 (×2): 20 mg via INTRAVENOUS
  Administered 2019-08-16 (×2): 30 mg via INTRAVENOUS

## 2019-08-16 MED ORDER — PROPOFOL 500 MG/50ML IV EMUL
INTRAVENOUS | Status: DC | PRN
  Administered 2019-08-16: 65 ug/kg/min via INTRAVENOUS

## 2019-08-16 MED ORDER — PHENYLEPHRINE HCL-NACL 10-0.9 MG/250ML-% IV SOLN
INTRAVENOUS | Status: DC | PRN
  Administered 2019-08-16: 60 ug/min via INTRAVENOUS

## 2019-08-16 MED ORDER — SODIUM CHLORIDE 0.9 % IV SOLN: INTRAVENOUS | Status: DC | PRN

## 2019-08-16 MED ORDER — PHENYLEPHRINE 40 MCG/ML (10ML) SYRINGE FOR IV PUSH (FOR BLOOD PRESSURE SUPPORT)
PREFILLED_SYRINGE | INTRAVENOUS | Status: DC | PRN
  Administered 2019-08-16: 80 ug via INTRAVENOUS
  Administered 2019-08-16: 40 ug via INTRAVENOUS
  Administered 2019-08-16 (×2): 80 ug via INTRAVENOUS

## 2019-08-16 NOTE — Progress Notes (Signed)
*  PRELIMINARY RESULTS* Echocardiogram Echocardiogram Transesophageal has been performed.  [name] 08/16/2019, 12:00 PM

## 2019-08-16 NOTE — Anesthesia Procedure Notes (Signed)
Procedure Name: MAC Date/Time: 08/16/2019 10:53 AM Performed by: Orlie Dakin, CRNA Pre-anesthesia Checklist: Patient identified, Emergency Drugs available, Suction available and Patient being monitored Patient Re-evaluated:Patient Re-evaluated prior to induction Oxygen Delivery Method: Nasal cannula Preoxygenation: Pre-oxygenation with 100% oxygen Induction Type: IV induction Placement Confirmation: positive ETCO2

## 2019-08-16 NOTE — Transfer of Care (Signed)
Immediate Anesthesia Transfer of Care Note  Patient: Joseph DOWDY Sr.  Procedure(s) Performed: TRANSESOPHAGEAL ECHOCARDIOGRAM (TEE) (N/A ) BUBBLE STUDY  Patient Location: Endoscopy Unit  Anesthesia Type:MAC  Level of Consciousness: drowsy  Airway & Oxygen Therapy: Patient Spontanous Breathing and Patient connected to nasal cannula oxygen  Post-op Assessment: Report given to RN and Post -op Vital signs reviewed and stable  Post vital signs: Reviewed and stable  Last Vitals:  Vitals Value Taken Time  BP    Temp    Pulse    Resp 18 08/16/19 1142  SpO2    Vitals shown include unvalidated device data.  Last Pain:  Vitals:   08/16/19 0929  TempSrc: Oral  PainSc: 5          Complications: No apparent anesthesia complications

## 2019-08-16 NOTE — Progress Notes (Signed)
Pt. HD tx moved to 08/17/2019. Primary nurse Allene Pyo, RN made aware

## 2019-08-16 NOTE — Anesthesia Postprocedure Evaluation (Signed)
Anesthesia Post Note  Patient: Joseph EDERER Sr.  Procedure(s) Performed: TRANSESOPHAGEAL ECHOCARDIOGRAM (TEE) (N/A ) BUBBLE STUDY     Patient location during evaluation: PACU Anesthesia Type: MAC Level of consciousness: confused (as in preop) Pain management: pain level controlled Vital Signs Assessment: post-procedure vital signs reviewed and stable Respiratory status: spontaneous breathing, nonlabored ventilation and respiratory function stable Cardiovascular status: stable and blood pressure returned to baseline Anesthetic complications: no    Last Vitals:  Vitals:   08/16/19 1216 08/16/19 1217  BP: (!) 98/55 (!) 100/53  Pulse: 95   Resp: 18   Temp: 36.7 C   SpO2: 92%     Last Pain:  Vitals:   08/16/19 1217  TempSrc:   PainSc: 0-No pain                 Audry Pili

## 2019-08-16 NOTE — Progress Notes (Signed)
Pt arrived back to rm 4E05 from endoscopy. VSS.  Bed alarm on.   Lavenia Atlas, RN

## 2019-08-16 NOTE — Progress Notes (Signed)
Physical Therapy Treatment Patient Details Name: Joseph GROFT Sr. MRN: [id number] DOB: Jul 07, 1940 Today's Date: 08/16/2019    History of Present Illness Pt is a 79 y/o male here for BKA L LE on 08/13/19. PMH of R LE transmetatarsal amp, HTN, arthritis, CVA with residual L sided weakness, arthritis, anxiety, ESRD M/W/F, recent fall with R hip avulasion fx WBAT with no medical mgmt.  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    PT Comments    Very slow progress. Pt with significant confusion. Continue to recommend SNF.    Follow Up Recommendations  SNF;Supervision/Assistance - 24 hour     Equipment Recommendations  Other (comment)(TBD)    Recommendations for Other Services       Precautions / Restrictions Precautions Precautions: Fall Precaution Comments: old transtarsal amp RLE, new L BKA Required Braces or Orthoses: Other Brace Other Brace: residual limb protector, wound vac Restrictions Weight Bearing Restrictions: No RLE Weight Bearing: Weight bearing as tolerated LLE Weight Bearing: Non weight bearing    Mobility  Bed Mobility Overal bed mobility: Needs Assistance Bed Mobility: Supine to Sit;Sit to Supine     Supine to sit: Mod assist;+2 for safety/equipment;HOB elevated Sit to supine: +2 for physical assistance;Mod assist   General bed mobility comments: Assist to bring legs off of bed, elevate trunk into sitting, and bring hips to EOB.  Transfers Overall transfer level: Needs assistance               General transfer comment: Attempted to come to stand with walker. Unable to bring hips off of bed with +2 max assist  Ambulation/Gait                 Stairs             Wheelchair Mobility    Modified Rankin (Stroke Patients Only)       Balance Overall balance assessment: Needs assistance Sitting-balance support: Bilateral upper extremity supported;Feet unsupported Sitting balance-Leahy Scale: Poor Sitting balance - Comments: Pt  sat EOB x 10 minutes with supervision to min guard                                    Cognition Arousal/Alertness: Awake/alert Behavior During Therapy: WFL for tasks assessed/performed Overall Cognitive Status: No family/caregiver present to determine baseline cognitive functioning Area of Impairment: Following commands;Problem solving;Awareness;Orientation;Attention;Memory;Safety/judgement                 Orientation Level: Disoriented to;Place;Time;Situation Current Attention Level: Sustained Memory: Decreased recall of precautions;Decreased short-term memory Following Commands: Follows one step commands inconsistently Safety/Judgement: Decreased awareness of safety;Decreased awareness of deficits Awareness: Emergent;Intellectual Problem Solving: Slow processing;Requires verbal cues;Requires tactile cues;Difficulty sequencing General Comments: pt with incr confusion today      Exercises Amputee Exercises Straight Leg Raises: AROM;Left;10 reps;Supine    General Comments        Pertinent Vitals/Pain Pain Assessment: Faces Faces Pain Scale: No hurt    Home Living                      Prior Function            PT Goals (current goals can now be found in the care plan section) Acute Rehab PT Goals Patient Stated Goal: not stated Progress towards PT goals: Progressing toward goals    Frequency    Min 2X/week      PT Plan  Current plan remains appropriate    Co-evaluation              AM-PAC PT "6 Clicks" Mobility   Outcome Measure  Help needed turning from your back to your side while in a flat bed without using bedrails?: A Lot Help needed moving from lying on your back to sitting on the side of a flat bed without using bedrails?: A Lot Help needed moving to and from a bed to a chair (including a wheelchair)?: Total Help needed standing up from a chair using your arms (e.g., wheelchair or bedside chair)?: Total Help needed  to walk in hospital room?: Total Help needed climbing 3-5 steps with a railing? : Total 6 Click Score: 8    End of Session Equipment Utilized During Treatment: Gait belt Activity Tolerance: Patient limited by fatigue Patient left: with call bell/phone within reach;in bed;with bed alarm set Nurse Communication: Mobility status PT Visit Diagnosis: Other abnormalities of gait and mobility (R26.89);Difficulty in walking, not elsewhere classified (R26.2);Muscle weakness (generalized) (M62.81)     Time: 6295-2841 PT Time Calculation (min) (ACUTE ONLY): 17 min  Charges:  $Therapeutic Activity: 8-22 mins                     [place] Pager [phone] Office [place] 08/16/2019, 4:18 PM

## 2019-08-16 NOTE — Progress Notes (Signed)
PROGRESS NOTE  Joseph REINARD Sr. CHE:[id number] DOB: 23-Jan-1941   PCP: [name], MD  Patient is from: Home  DOA: 08/09/2019 LOS: 6  Brief Narrative / Interim history: 79 year old male with history of ESRD on HD MWF, diastolic CHF, moderate AS, PVD, HTN, IDA and bilateral transmetatarsal amputation brought to ED due to altered mental status during dialysis.  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  Admitted for sepsis in the setting of postoperative left foot infection and found to have Enterococcus bacteremia.  ID, orthopedic surgery (Dr. Sharol Given) and nephrology following. Patient underwent left transtibial amputation by Dr. Sharol Given on 4/16. Repeat blood culture negative.  TTE without vegetation but severe aortic stenosis.  Cardiology consulted for TEE and evaluation of aortic stenosis.    Patient underwent TEE on 08/16/2019 that showed aortic valve stenosis with moderate AVR, mild MR, mild to moderate TR, severe atherosclerotic plaque in descending aorta and aortic arch but no valvular vegetation.   Subjective: Seen and examined earlier this morning before he went for TEE.  No major events overnight of this morning.  No complaints.  He denies pain, dyspnea, dizziness or GI symptoms.  Objective: Vitals:   08/16/19 0929 08/16/19 1144 08/16/19 1150 08/16/19 1158  BP: (!) 139/47 (!) 91/34  97/77  Pulse: 98     Resp: 19 18 16 13   Temp: 98.3 F (36.8 C) 97.7 F (36.5 C)    TempSrc: Oral Oral    SpO2: 97% 100% 99% 99%  Weight:      Height:        Intake/Output Summary (Last 24 hours) at 08/16/2019 1208 Last data filed at 08/16/2019 1144 Gross per 24 hour  Intake 250 ml  Output 0 ml  Net 250 ml   Filed Weights   08/14/19 0624 08/15/19 0350 08/16/19 0323  Weight: 62.5 kg 63.4 kg 62.6 kg    Examination:  GENERAL: No apparent distress.  Nontoxic. HEENT: MMM.  Vision and hearing grossly intact.  NECK: Supple.  No apparent JVD.  RESP: On  room air.  No IWOB.  Fair aeration bilaterally. CVS:  RRR.  2/6 SEM all over. ABD/GI/GU: Bowel sounds present. Soft. Non tender.  MSK/EXT:  Moves extremities.  Right transmetatarsal amputation.  Limb protector and a stump shrinker over LLE.  No drainage in the wound VAC canister. SKIN: Skin wound over the plantar aspect of right foot without signs of infection.  Stage I sacral decubitus. NEURO: Awake, alert and oriented appropriately.  No apparent focal neuro deficit. PSYCH: Calm. Normal affect.    Procedures:  4/16-left transtibial amputation with wound VAC placement 4/19-aortic valve stenosis with moderate AVR, mild MR, mild to moderate TR, severe atherosclerotic plaque in descending aorta and aortic arch but no valvular vegetation but limited assessment by a shadow from dense calcification on MV and AV..   Microbiology summarized: 4/13-COVID-19 PCR negative. 4/13-blood cultures with GPC.  BICD with Enterococcus. 4/14-blood cultures NGTD  Assessment & Plan: Sepsis due to Enterococcus bacteremia  Postoperative left foot wound infection-patient had left transmetatarsal amputation on 3/24. -CRP and ESR mildly elevated. -Underwent left transtibial amputation on 4/16 by Dr. Sharol Given. -Repeat blood culture on 4/14 NGTD -TTE without vegetation but severe aortic stenosis -TEE without vegetation but limited assessment by a shadow from dense calcification on MV and AV. -Continue vancomycin per ID. -Trend leukocytosis-improving.  Acute metabolic encephalopathy: Likely due to the above.  CT head without acute finding.  No focal neuro deficit.  Seems  to have resolved.  He is fairly oriented. -Manage sepsis/bacteremia as above. -Frequent reorientation and delirium precautions.  ESRD on HD M/W/F at the [facility] HD center Bone marrow disorder Azotemia/hyperkalemia/hyponatremia/Anion gap metabolic acidosis-resolved. Anemia of renal disease-stable. -Nephrology managing  Severe aortic  stenosis/chronic diastolic CHF: No cardiopulmonary symptoms.  Appears euvolemic. -Cardiology consulted-recommended outpatient follow-up.  TEE on 4/19 for bacteremia. -Fluid management by dialysis -Strict intake and output  Hypotension: Slightly soft blood pressures but not symptomatic. -Continue midodrine on dialysis days  Peripheral vascular disease:  -Resume Plavix  History of nonhemorrhagic CVA in 2000: No apparent neuro deficit on exam. -Continue home medications.   Hyperlipidemia:  -Continue home atorvastatin  Bandemia/thrombocytosis: Likely reactive from #1.  Improving. -Continue monitoring  Debility -PT/OT recommended SNF.              Pressure injury: not POA.  Stage I-II Pressure Injury 08/15/19 Sacrum (Active)  08/15/19 0300  Location: Sacrum  Location Orientation:   Staging:   Wound Description (Comments):   Present on Admission:      DVT prophylaxis: Subcu heparin Code Status: Full code Family Communication: None at bedside.  Updated patient's daughter over the phone on 4/19  Discharge barrier: SNF bed and final recommendation by ID about antibiotic course. Patient is from: Home Final disposition: Likely SNF when bed available pending final recommendation by ID  Consultants:  Orthopedic surgery, Dr. Sharol Given Infectious disease Nephrology Cardiology  Sch Meds:  Scheduled Meds: . atorvastatin  40 mg Oral Daily  . Chlorhexidine Gluconate Cloth  6 each Topical Q0600  . cinacalcet  60 mg Oral QPM  . docusate sodium  100 mg Oral BID  . doxercalciferol  2.5 mcg Oral Q M,W,F-HD  . heparin  5,000 Units Subcutaneous Q8H  . melatonin  3 mg Oral QHS  . midodrine  10 mg Oral Q M,W,F  . multivitamin  1 tablet Oral QHS  . saccharomyces boulardii  250 mg Oral BID  . senna-docusate  1 tablet Oral Daily   Continuous Infusions: . sodium chloride Stopped (08/13/19 1817)  . vancomycin 750 mg (08/14/19 0043)   PRN Meds:.acetaminophen **OR**  acetaminophen, albuterol, HYDROmorphone (DILAUDID) injection, oxyCODONE  Antimicrobials: Anti-infectives (From admission, onward)   Start     Dose/Rate Route Frequency Ordered Stop   08/13/19 2200  ceFAZolin (ANCEF) IVPB 1 g/50 mL premix     1 g 100 mL/hr over 30 Minutes Intravenous  Once 08/13/19 0947 08/14/19 0321   08/13/19 0738  ceFAZolin (ANCEF) 2-4 GM/100ML-% IVPB    Note to Pharmacy: Clearnce Sorrel   : cabinet override      08/13/19 0738 08/13/19 1944   08/11/19 1200  vancomycin (VANCOCIN) IVPB 750 mg/150 ml premix     750 mg 150 mL/hr over 60 Minutes Intravenous Every M-W-F (Hemodialysis) 08/11/19 0903     08/11/19 1000  ampicillin (OMNIPEN) 2 g in sodium chloride 0.9 % 100 mL IVPB  Status:  Discontinued     2 g 300 mL/hr over 20 Minutes Intravenous Every 12 hours 08/11/19 0405 08/11/19 0857   08/11/19 0915  vancomycin (VANCOREADY) IVPB 1250 mg/250 mL  Status:  Discontinued     1,250 mg 166.7 mL/hr over 90 Minutes Intravenous  Once 08/11/19 0903 08/11/19 0907   08/11/19 0915  ceFAZolin (ANCEF) IVPB 2g/100 mL premix     2 g 200 mL/hr over 30 Minutes Intravenous On call to O.R. 08/11/19 0909 08/12/19 0559   08/11/19 0000  vancomycin variable dose per unstable renal function (pharmacist  dosing)  Status:  Discontinued      Does not apply See admin instructions 08/10/19 1108 08/11/19 0405   08/10/19 1600  piperacillin-tazobactam (ZOSYN) IVPB 2.25 g  Status:  Discontinued     2.25 g 100 mL/hr over 30 Minutes Intravenous Every 8 hours 08/10/19 1108 08/11/19 0405   08/10/19 1115  vancomycin (VANCOREADY) IVPB 500 mg/100 mL     500 mg 100 mL/hr over 60 Minutes Intravenous NOW 08/10/19 1105 08/10/19 1435   08/10/19 0745  piperacillin-tazobactam (ZOSYN) IVPB 3.375 g     3.375 g 100 mL/hr over 30 Minutes Intravenous  Once 08/10/19 0732 08/10/19 0935   08/10/19 0745  vancomycin (VANCOCIN) IVPB 1000 mg/200 mL premix     1,000 mg 200 mL/hr over 60 Minutes Intravenous  Once 08/10/19 0732  08/10/19 0959       I have personally reviewed the following labs and images: CBC: Recent Labs  Lab 08/12/19 0518 08/13/19 0306 08/13/19 1020 08/14/19 0509 08/16/19 0231  WBC 40.6* 39.0* 40.8* 31.3* 32.7*  NEUTROABS 35.8* 34.2*  --  26.6*  --   HGB 12.1* 11.3* 11.3* 11.0* 10.9*  HCT 37.1* 34.5* 33.4* 33.3* 33.2*  MCV 61.9* 61.5* 62.1* 61.3* 62.1*  PLT 415* 438* 463* 404* 381   BMP &GFR Recent Labs  Lab 08/10/19 1134 08/10/19 1134 08/11/19 0306 08/11/19 0306 08/11/19 1130 08/11/19 2021 08/13/19 0306 08/13/19 1020 08/14/19 0509  NA 133*   < > 131*   < > 132* 136 136 135 137  K 4.9   < > 5.3*   < > 5.5* 3.7 3.9 4.4 4.0  CL 93*   < > 92*   < > 92* 96* 95* 96* 98  CO2 25   < > 22   < > 20* 27 26 22 25   GLUCOSE 113*   < > 70   < > 70 97 76 88 72  BUN 69*   < > 77*   < > 81* 27* 47* 51* 19  CREATININE 10.75*   < > 11.38*   < > 11.57* 5.43* 7.49* 7.91* 4.01*  CALCIUM 9.7   < > 9.9   < > 9.8 9.1 9.7 9.3 8.3*  MG  --   --   --   --   --  1.9 2.0  --  1.9  PHOS 2.8  --  2.5  --   --   --   --  3.0 2.1*   < > = values in this interval not displayed.   Estimated Creatinine Clearance: 13.6 mL/min (A) (by C-G formula based on SCr of 4.01 mg/dL (H)). Liver & Pancreas: Recent Labs  Lab 08/10/19 0822 08/10/19 1134 08/11/19 0306 08/13/19 1020 08/14/19 0509  AST 68*  --   --   --   --   ALT 21  --   --   --   --   ALKPHOS 72  --   --   --   --   BILITOT 0.9  --   --   --   --   PROT 7.9  --   --   --   --   ALBUMIN 2.1* 1.8* 1.9* 1.8* 1.7*   Recent Labs  Lab 08/10/19 0822  LIPASE 20   No results for input(s): AMMONIA in the last 168 hours. Diabetic: No results for input(s): HGBA1C in the last 72 hours. Recent Labs  Lab 08/10/19 0652 08/13/19 0833  GLUCAP 114* 86   Cardiac Enzymes:  No results for input(s): CKTOTAL, CKMB, CKMBINDEX, TROPONINI in the last 168 hours. No results for input(s): PROBNP in the last 8760 hours. Coagulation Profile: No results for  input(s): INR, PROTIME in the last 168 hours. Thyroid Function Tests: No results for input(s): TSH, T4TOTAL, FREET4, T3FREE, THYROIDAB in the last 72 hours. Lipid Profile: No results for input(s): CHOL, HDL, LDLCALC, TRIG, CHOLHDL, LDLDIRECT in the last 72 hours. Anemia Panel: No results for input(s): VITAMINB12, FOLATE, FERRITIN, TIBC, IRON, RETICCTPCT in the last 72 hours. Urine analysis: No results found for: COLORURINE, APPEARANCEUR, LABSPEC, PHURINE, GLUCOSEU, HGBUR, BILIRUBINUR, KETONESUR, PROTEINUR, UROBILINOGEN, NITRITE, LEUKOCYTESUR Sepsis Labs: Invalid input(s): PROCALCITONIN, [place]  Microbiology: Recent Results (from the past 240 hour(s))  Blood culture (routine x 2)     Status: Abnormal   Collection Time: 08/10/19  4:30 AM   Specimen: BLOOD LEFT ARM  Result Value Ref Range Status   Specimen Description BLOOD LEFT ARM  Final   Special Requests   Final    BOTTLES DRAWN AEROBIC AND ANAEROBIC Blood Culture adequate volume   Culture  Setup Time   Final    IN BOTH AEROBIC AND ANAEROBIC BOTTLES GRAM POSITIVE COCCI CRITICAL RESULT CALLED TO, READ BACK BY AND VERIFIED WITH: G. ABBOTT,PHARMD [id number] 08/11/2019 T. TYSOR    Culture (A)  Final    ENTEROCOCCUS FAECALIS SUSCEPTIBILITIES PERFORMED ON PREVIOUS CULTURE WITHIN THE LAST 5 DAYS. Performed at [facility], [place] [address] [place], [place] [address]    Report Status 08/13/2019 FINAL  Final  Blood Culture ID Panel (Reflexed)     Status: Abnormal   Collection Time: 08/10/19  4:30 AM  Result Value Ref Range Status   Enterococcus species DETECTED (A) NOT DETECTED Final    Comment: CRITICAL RESULT CALLED TO, READ BACK BY AND VERIFIED WITH: G. ABBOTT,PHARMD [id number] 08/11/2019 T. TYSOR    Vancomycin resistance NOT DETECTED NOT DETECTED Final   Listeria monocytogenes NOT DETECTED NOT DETECTED Final   Staphylococcus species NOT DETECTED NOT DETECTED Final   Staphylococcus aureus (BCID) NOT DETECTED NOT DETECTED Final    Streptococcus species NOT DETECTED NOT DETECTED Final   Streptococcus agalactiae NOT DETECTED NOT DETECTED Final   Streptococcus pneumoniae NOT DETECTED NOT DETECTED Final   Streptococcus pyogenes NOT DETECTED NOT DETECTED Final   Acinetobacter baumannii NOT DETECTED NOT DETECTED Final   Enterobacteriaceae species NOT DETECTED NOT DETECTED Final   Enterobacter cloacae complex NOT DETECTED NOT DETECTED Final   Escherichia coli NOT DETECTED NOT DETECTED Final   Klebsiella oxytoca NOT DETECTED NOT DETECTED Final   Klebsiella pneumoniae NOT DETECTED NOT DETECTED Final   Proteus species NOT DETECTED NOT DETECTED Final   Serratia marcescens NOT DETECTED NOT DETECTED Final   Haemophilus influenzae NOT DETECTED NOT DETECTED Final   Neisseria meningitidis NOT DETECTED NOT DETECTED Final   Pseudomonas aeruginosa NOT DETECTED NOT DETECTED Final   Candida albicans NOT DETECTED NOT DETECTED Final   Candida glabrata NOT DETECTED NOT DETECTED Final   Candida krusei NOT DETECTED NOT DETECTED Final   Candida parapsilosis NOT DETECTED NOT DETECTED Final   Candida tropicalis NOT DETECTED NOT DETECTED Final    Comment: Performed at [facility], [address] [place], [place] [address]  Blood culture (routine x 2)     Status: Abnormal   Collection Time: 08/10/19  4:55 AM   Specimen: BLOOD RIGHT ARM  Result Value Ref Range Status   Specimen Description BLOOD RIGHT ARM  Final   Special Requests  Final    BOTTLES DRAWN AEROBIC ONLY Blood Culture adequate volume   Culture  Setup Time   Final    AEROBIC BOTTLE ONLY GRAM POSITIVE COCCI CRITICAL VALUE NOTED.  VALUE IS CONSISTENT WITH PREVIOUSLY REPORTED AND CALLED VALUE. Performed at [facility], [place] [address], [place], [place] [address]    Culture ENTEROCOCCUS FAECALIS (A)  Final   Report Status 08/13/2019 FINAL  Final   Organism ID, Bacteria ENTEROCOCCUS FAECALIS  Final      Susceptibility   Enterococcus faecalis - MIC*    AMPICILLIN  <=2 SENSITIVE Sensitive     VANCOMYCIN <=0.5 SENSITIVE Sensitive     GENTAMICIN SYNERGY SENSITIVE Sensitive     * ENTEROCOCCUS FAECALIS  SARS CORONAVIRUS 2 (TAT 6-24 HRS) Nasopharyngeal Nasopharyngeal Swab     Status: None   Collection Time: 08/10/19  9:59 AM   Specimen: Nasopharyngeal Swab  Result Value Ref Range Status   SARS Coronavirus 2 NEGATIVE NEGATIVE Final    Comment: (NOTE) SARS-CoV-2 target nucleic acids are NOT DETECTED. The SARS-CoV-2 RNA is generally detectable in upper and lower respiratory specimens during the acute phase of infection. Negative results do not preclude SARS-CoV-2 infection, do not rule out co-infections with other pathogens, and should not be used as the sole basis for treatment or other patient management decisions. Negative results must be combined with clinical observations, patient history, and epidemiological information. The expected result is Negative. Fact Sheet for Patients: SugarRoll.be Fact Sheet for Healthcare Providers: https://www.woods-mathews.com/ This test is not yet approved or cleared by the [place] FDA and  has been authorized for detection and/or diagnosis of SARS-CoV-2 by FDA under an Emergency Use Authorization (EUA). This EUA will remain  in effect (meaning this test can be used) for the duration of the COVID-19 declaration under Section 56 4(b)(1) of the Act, 21 U.S.C. section 360bbb-3(b)(1), unless the authorization is terminated or revoked sooner. Performed at [facility], [place] [address], [place], [place] [address]   Culture, blood (routine x 2)     Status: None   Collection Time: 08/11/19 10:36 AM   Specimen: BLOOD RIGHT HAND  Result Value Ref Range Status   Specimen Description BLOOD RIGHT HAND  Final   Special Requests   Final    BOTTLES DRAWN AEROBIC ONLY Blood Culture adequate volume   Culture   Final    NO GROWTH 5 DAYS Performed at [facility],  [place] [address] [place], [place] [address]    Report Status 08/16/2019 FINAL  Final  Culture, blood (routine x 2)     Status: None   Collection Time: 08/11/19 10:39 AM   Specimen: BLOOD RIGHT HAND  Result Value Ref Range Status   Specimen Description BLOOD RIGHT HAND  Final   Special Requests   Final    BOTTLES DRAWN AEROBIC ONLY Blood Culture results may not be optimal due to an inadequate volume of blood received in culture bottles   Culture   Final    NO GROWTH 5 DAYS Performed at [facility], [place] [address] [place], [place] [address]    Report Status 08/16/2019 FINAL  Final    Radiology Studies: No results found.    Grady Lucci T. [place]  If 7PM-7AM, please contact night-coverage [email or web address] Password [facility] 08/16/2019, 12:08 PM

## 2019-08-16 NOTE — Progress Notes (Signed)
This is a pleasant gentleman who is postop day 3 status post left transtibial amputation.  He is lying in bed comfortably.  He does say he has been having some phantom pain  Wound VAC is in place 0 cc in the canister.  Wound VAC is functioning.  Plan: Discharged to skilled nursing.  From an orthopedic standpoint he could be discharged.  Would need 1 week follow-up after with Dr. Sharol Given

## 2019-08-16 NOTE — Interval H&P Note (Signed)
History and Physical Interval Note:  08/16/2019 10:43 AM  [name]  has presented today for surgery, with the diagnosis of SEVERE AS, BACTEREMIA.  The various methods of treatment have been discussed with the patient and family. After consideration of risks, benefits and other options for treatment, the patient's daughter [name] has consented to  Procedure(s): TRANSESOPHAGEAL ECHOCARDIOGRAM (TEE) (N/A) as a surgical intervention on behalf of the patient.  The patient's history has been reviewed, patient examined, no change in status, stable for surgery.  I have reviewed the patient's chart and labs.  Questions were answered to the patient's satisfaction.     Elouise Munroe

## 2019-08-16 NOTE — Consult Note (Signed)
   [facility] CM Inpatient Consult   08/16/2019  Joseph LLAMAS Sr. 1940-07-08 [id number]   Patient screened for high risk score for unplanned readmission and for less than 30 days for readmission. Patient in the [place] with [employer] [facility] [[facility]].  Review of patient's medical record reveals patient is recommending a skilled nursing facility level of care.  Patient was at [facility] prior to admission which is not a facility followed by [facility] RN.  Primary Care Provider is:   Joseph Fire, MD  Plan:  Follow up for progress and disposition to assess for post hospital care management needs.    Please place a [facility] Care Management consult as appropriate and for questions contact:   Joseph Brood, RN BSN [facility] Liaison  [phone] business mobile phone Toll free office [phone]  Fax number: [phone] [email or web address]

## 2019-08-16 NOTE — Progress Notes (Signed)
[facility]   Reason for visit: Follow up on bacteremia  Interval History: TEE without vegetation, no complaints from the patient.  WBC stable, elevated. REmains afebrile.    Physical Exam: Constitutional:  Vitals:   08/16/19 1216 08/16/19 1217  BP: (!) 98/55 (!) 100/53  Pulse: 95   Resp: 18   Temp: 98.1 F (36.7 C)   SpO2: 92%    patient appears in NAD Respiratory: Normal respiratory effort; CTA B Cardiovascular: RRR GI: soft, nt, nd MS: left leg with VAC  Review of Systems: Constitutional: negative for fevers and chills Gastrointestinal: negative for diarrhea  Lab Results  Component Value Date   WBC 32.7 (H) 08/16/2019   HGB 10.9 (L) 08/16/2019   HCT 33.2 (L) 08/16/2019   MCV 62.1 (L) 08/16/2019   PLT 381 08/16/2019    Lab Results  Component Value Date   CREATININE 4.01 (H) 08/14/2019   BUN 19 08/14/2019   NA 137 08/14/2019   K 4.0 08/14/2019   CL 98 08/14/2019   CO2 25 08/14/2019    Lab Results  Component Value Date   ALT 21 08/10/2019   AST 68 (H) 08/10/2019   ALKPHOS 72 08/10/2019     Microbiology: Recent Results (from the past 240 hour(s))  Blood culture (routine x 2)     Status: Abnormal   Collection Time: 08/10/19  4:30 AM   Specimen: BLOOD LEFT ARM  Result Value Ref Range Status   Specimen Description BLOOD LEFT ARM  Final   Special Requests   Final    BOTTLES DRAWN AEROBIC AND ANAEROBIC Blood Culture adequate volume   Culture  Setup Time   Final    IN BOTH AEROBIC AND ANAEROBIC BOTTLES GRAM POSITIVE COCCI CRITICAL RESULT CALLED TO, READ BACK BY AND VERIFIED WITH: G. ABBOTT,PHARMD [id number] 08/11/2019 T. TYSOR    Culture (A)  Final    ENTEROCOCCUS FAECALIS SUSCEPTIBILITIES PERFORMED ON PREVIOUS CULTURE WITHIN THE LAST 5 DAYS. Performed at [facility], [place] [address] [place], [place] [address]    Report Status 08/13/2019 FINAL  Final  Blood Culture ID Panel (Reflexed)     Status: Abnormal   Collection  Time: 08/10/19  4:30 AM  Result Value Ref Range Status   Enterococcus species DETECTED (A) NOT DETECTED Final    Comment: CRITICAL RESULT CALLED TO, READ BACK BY AND VERIFIED WITH: G. ABBOTT,PHARMD [id number] 08/11/2019 T. TYSOR    Vancomycin resistance NOT DETECTED NOT DETECTED Final   Listeria monocytogenes NOT DETECTED NOT DETECTED Final   Staphylococcus species NOT DETECTED NOT DETECTED Final   Staphylococcus aureus (BCID) NOT DETECTED NOT DETECTED Final   Streptococcus species NOT DETECTED NOT DETECTED Final   Streptococcus agalactiae NOT DETECTED NOT DETECTED Final   Streptococcus pneumoniae NOT DETECTED NOT DETECTED Final   Streptococcus pyogenes NOT DETECTED NOT DETECTED Final   Acinetobacter baumannii NOT DETECTED NOT DETECTED Final   Enterobacteriaceae species NOT DETECTED NOT DETECTED Final   Enterobacter cloacae complex NOT DETECTED NOT DETECTED Final   Escherichia coli NOT DETECTED NOT DETECTED Final   Klebsiella oxytoca NOT DETECTED NOT DETECTED Final   Klebsiella pneumoniae NOT DETECTED NOT DETECTED Final   Proteus species NOT DETECTED NOT DETECTED Final   Serratia marcescens NOT DETECTED NOT DETECTED Final   Haemophilus influenzae NOT DETECTED NOT DETECTED Final   Neisseria meningitidis NOT DETECTED NOT DETECTED Final   Pseudomonas aeruginosa NOT DETECTED NOT DETECTED Final   Candida albicans NOT DETECTED NOT  DETECTED Final   Candida glabrata NOT DETECTED NOT DETECTED Final   Candida krusei NOT DETECTED NOT DETECTED Final   Candida parapsilosis NOT DETECTED NOT DETECTED Final   Candida tropicalis NOT DETECTED NOT DETECTED Final    Comment: Performed at [facility], [place] [address], [place], [place] [address]  Blood culture (routine x 2)     Status: Abnormal   Collection Time: 08/10/19  4:55 AM   Specimen: BLOOD RIGHT ARM  Result Value Ref Range Status   Specimen Description BLOOD RIGHT ARM  Final   Special Requests   Final    BOTTLES DRAWN AEROBIC ONLY Blood  Culture adequate volume   Culture  Setup Time   Final    AEROBIC BOTTLE ONLY GRAM POSITIVE COCCI CRITICAL VALUE NOTED.  VALUE IS CONSISTENT WITH PREVIOUSLY REPORTED AND CALLED VALUE. Performed at [facility], [place] [address] [place], [place] [address]    Culture ENTEROCOCCUS FAECALIS (A)  Final   Report Status 08/13/2019 FINAL  Final   Organism ID, Bacteria ENTEROCOCCUS FAECALIS  Final      Susceptibility   Enterococcus faecalis - MIC*    AMPICILLIN <=2 SENSITIVE Sensitive     VANCOMYCIN <=0.5 SENSITIVE Sensitive     GENTAMICIN SYNERGY SENSITIVE Sensitive     * ENTEROCOCCUS FAECALIS  SARS CORONAVIRUS 2 (TAT 6-24 HRS) Nasopharyngeal Nasopharyngeal Swab     Status: None   Collection Time: 08/10/19  9:59 AM   Specimen: Nasopharyngeal Swab  Result Value Ref Range Status   SARS Coronavirus 2 NEGATIVE NEGATIVE Final    Comment: (NOTE) SARS-CoV-2 target nucleic acids are NOT DETECTED. The SARS-CoV-2 RNA is generally detectable in upper and lower respiratory specimens during the acute phase of infection. Negative results do not preclude SARS-CoV-2 infection, do not rule out co-infections with other pathogens, and should not be used as the sole basis for treatment or other patient management decisions. Negative results must be combined with clinical observations, patient history, and epidemiological information. The expected result is Negative. Fact Sheet for Patients: SugarRoll.be Fact Sheet for Healthcare Providers: https://www.woods-mathews.com/ This test is not yet approved or cleared by the [place] FDA and  has been authorized for detection and/or diagnosis of SARS-CoV-2 by FDA under an Emergency Use Authorization (EUA). This EUA will remain  in effect (meaning this test can be used) for the duration of the COVID-19 declaration under Section 56 4(b)(1) of the Act, 21 U.S.C. section 360bbb-3(b)(1), unless the authorization is  terminated or revoked sooner. Performed at [facility], [place] [address] [place], [place] [address]   Culture, blood (routine x 2)     Status: None   Collection Time: 08/11/19 10:36 AM   Specimen: BLOOD RIGHT HAND  Result Value Ref Range Status   Specimen Description BLOOD RIGHT HAND  Final   Special Requests   Final    BOTTLES DRAWN AEROBIC ONLY Blood Culture adequate volume   Culture   Final    NO GROWTH 5 DAYS Performed at [facility], [place] [address], [place], [place] [address]    Report Status 08/16/2019 FINAL  Final  Culture, blood (routine x 2)     Status: None   Collection Time: 08/11/19 10:39 AM   Specimen: BLOOD RIGHT HAND  Result Value Ref Range Status   Specimen Description BLOOD RIGHT HAND  Final   Special Requests   Final    BOTTLES DRAWN AEROBIC ONLY Blood Culture results may not be optimal  due to an inadequate volume of blood received in culture bottles   Culture   Final    NO GROWTH 5 DAYS Performed at [facility], [place] [address], [place], [place] [address]    Report Status 08/16/2019 FINAL  Final    Impression/Plan:  1. Bacteremia - Enterococcus and no obvious source though leg certainly possible.  Now s/p amputation.  TEE without vegetation.  I recommend completing 2 weeks of vancomycin after dialysis through April 27th (last dose after dialysis on Monday 4/26) and can stop.  2.  Leukocytosis - remains elevated, likely leukemoid at this point.  No signs of C diff.  Likely will trend down over time.    3.  PVD - s/p amputation with gangrene.    I will sign off, thanks for consultation

## 2019-08-16 NOTE — Progress Notes (Signed)
   Having transesophageal echocardiogram done at the time of rounds.  Chart reviewed.

## 2019-08-16 NOTE — CV Procedure (Addendum)
INDICATIONS: Bacteremia Severe aortic valve stenosis Atrial fibrillation  PROCEDURE:   Informed consent was obtained prior to the procedure. The risks, benefits and alternatives for the procedure were discussed and the patient comprehended these risks.  Risks include, but are not limited to, cough, sore throat, vomiting, nausea, somnolence, esophageal and stomach trauma or perforation, bleeding, low blood pressure, aspiration, pneumonia, infection, trauma to the teeth and death.    After a procedural time-out, the oropharynx was anesthetized with 20% benzocaine spray.   During this procedure the patient was administered propofol for deep monitored sedation.  The patient's heart rate, blood pressure, and oxygen saturationweare monitored continuously during the procedure. The period of conscious sedation was 50 minutes, of which I was present face-to-face 100% of this time.  The transesophageal probe was inserted in the esophagus and stomach without difficulty and multiple views were obtained.  The patient was kept under observation until the patient left the procedure room.  The patient left the procedure room in stable condition.   Agitated microbubble saline contrast was administered.  COMPLICATIONS:    There were no immediate complications.  FINDINGS:  Severe aortic valve stenosis, moderate aortic valve regurgitation.  Mild MR. Mild-moderate TR. 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  No valvular vegetations noted, though dense calcification on mitral and aortic valve limit assessment slightly from shadowing.   RECOMMENDATIONS:     return to hospital bed when stable.   I called [name] his daughter with results immediately after procedure.  Time Spent Directly with the Patient:  75 minutes   Elouise Munroe 08/16/2019, 11:38 AM

## 2019-08-16 NOTE — Progress Notes (Addendum)
PT Cancellation Note  Patient Details Name: Joseph CROCHET Sr. MRN: [id number] DOB: 03-25-1941   Cancelled Treatment:    Reason Eval/Treat Not Completed: Patient at procedure or test/unavailable. Pt at TEE.    Shary Decamp [facility] 08/16/2019, 10:18 AM [place] Pager [phone] Office [phone]

## 2019-08-17 DIAGNOSIS — R7881 Bacteremia: Secondary | ICD-10-CM | POA: Diagnosis not present

## 2019-08-17 DIAGNOSIS — D473 Essential (hemorrhagic) thrombocythemia: Secondary | ICD-10-CM | POA: Diagnosis not present

## 2019-08-17 DIAGNOSIS — I96 Gangrene, not elsewhere classified: Secondary | ICD-10-CM | POA: Diagnosis not present

## 2019-08-17 DIAGNOSIS — I739 Peripheral vascular disease, unspecified: Secondary | ICD-10-CM | POA: Diagnosis not present

## 2019-08-17 DIAGNOSIS — R195 Other fecal abnormalities: Secondary | ICD-10-CM

## 2019-08-17 LAB — CBC
HCT: 31.3 % — ABNORMAL LOW (ref 39.0–52.0)
Hemoglobin: 10.3 g/dL — ABNORMAL LOW (ref 13.0–17.0)
MCH: 20.3 pg — ABNORMAL LOW (ref 26.0–34.0)
MCHC: 32.9 g/dL (ref 30.0–36.0)
MCV: 61.6 fL — ABNORMAL LOW (ref 80.0–100.0)
Platelets: 334 K/uL (ref 150–400)
RBC: 5.08 MIL/uL (ref 4.22–5.81)
RDW: 18.8 % — ABNORMAL HIGH (ref 11.5–15.5)
WBC: 30.9 K/uL — ABNORMAL HIGH (ref 4.0–10.5)
nRBC: 0.1 % (ref 0.0–0.2)

## 2019-08-17 LAB — TYPE AND SCREEN
ABO/RH(D): A POS
Antibody Screen: NEGATIVE

## 2019-08-17 LAB — ABO/RH: ABO/RH(D): A POS

## 2019-08-17 LAB — SARS CORONAVIRUS 2 (TAT 6-24 HRS): SARS Coronavirus 2: NEGATIVE

## 2019-08-17 LAB — OCCULT BLOOD X 1 CARD TO LAB, STOOL: Fecal Occult Bld: POSITIVE — AB

## 2019-08-17 MED ORDER — VANCOMYCIN HCL IN DEXTROSE 750-5 MG/150ML-% IV SOLN
INTRAVENOUS | Status: AC
  Administered 2019-08-17: 750 mg via INTRAVENOUS
  Filled 2019-08-17: qty 150

## 2019-08-17 MED ORDER — CLOPIDOGREL BISULFATE 75 MG PO TABS
75.0000 mg | ORAL_TABLET | Freq: Every day | ORAL | Status: DC
  Administered 2019-08-17 – 2019-08-20 (×4): 75 mg via ORAL
  Filled 2019-08-17 (×3): qty 1

## 2019-08-17 MED ORDER — VANCOMYCIN HCL IN DEXTROSE 750-5 MG/150ML-% IV SOLN
750.0000 mg | INTRAVENOUS | Status: DC
  Filled 2019-08-17 (×2): qty 150

## 2019-08-17 NOTE — Progress Notes (Signed)
PROGRESS NOTE  Joseph GATT Sr. ZJI:[id number] DOB: 10/14/1940   PCP: [name], MD  Patient is from: Home  DOA: 08/09/2019 LOS: 7  Brief Narrative / Interim history: 79 year old male with history of ESRD on HD MWF, diastolic CHF, moderate AS, PVD, HTN, IDA and bilateral transmetatarsal amputation brought to ED due to altered mental status during dialysis.  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  Admitted for sepsis in the setting of postoperative left foot infection and found to have Enterococcus bacteremia.  ID, orthopedic surgery (Dr. Sharol Given) and nephrology following. Patient underwent left transtibial amputation by Dr. Sharol Given on 4/16. Repeat blood culture negative.  TTE without vegetation but severe aortic stenosis.  Cardiology consulted for TEE and evaluation of aortic stenosis.    Patient underwent TEE on 08/16/2019 that showed aortic valve stenosis with moderate AVR, mild MR, mild to moderate TR, severe atherosclerotic plaque in descending aorta and aortic arch but no valvular vegetation.   ID recommended vancomycin after each dialysis through April 27 (last dose after HD on Monday 4/26).  Patient to be discharged to SNF with wound VAC and follow-up with orthopedic surgery in a week.  Patient is medically stable for discharge to SNF, likely on 4/21.   Subjective: Seen and examined earlier this morning.  No major events overnight or this morning.  No complaints.  He denies chest pain, dyspnea, dizziness or GI symptoms.  Pain fairly controlled.  Objective: Vitals:   08/17/19 1517 08/17/19 1554 08/17/19 1631 08/17/19 1754  BP: (!) 84/26 (!) 96/52 104/85 (!) 113/49  Pulse:   78 76  Resp:   18 19  Temp:   98 F (36.7 C) 98.1 F (36.7 C)  TempSrc:   Oral Oral  SpO2:   98% 100%  Weight:   62.7 kg   Height:        Intake/Output Summary (Last 24 hours) at 08/17/2019 1831 Last data filed at 08/17/2019 1700 Gross per 24 hour  Intake  150 ml  Output 404 ml  Net -254 ml   Filed Weights   08/17/19 0358 08/17/19 1403 08/17/19 1631  Weight: 65.1 kg 63.6 kg 62.7 kg    Examination:  GENERAL: No apparent distress.  Nontoxic. HEENT: MMM.  Vision and hearing grossly intact.  NECK: Supple.  No apparent JVD.  RESP: On room air.  No IWOB.  Fair aeration bilaterally. CVS:  RRR.  2/6 SEM all over. ABD/GI/GU: Bowel sounds present. Soft. Non tender.  MSK/EXT:  Moves extremities.  Right transmetatarsal amputation.  Limb protector on a stump shrinker over LLE.  0 cc in wound VAC canister. SKIN: Stage I sacral decubitus.  Stage II right foot wound over the plantar aspect NEURO: Awake and oriented fairly.  No apparent focal neuro deficit. PSYCH: Calm. Normal affect.    Procedures:  4/16-left transtibial amputation with wound VAC placement  4/19-TEE with aortic valve stenosis with moderate AVR, mild MR, mild to moderate TR, severe atherosclerotic plaque in descending aorta and aortic arch but no valvular vegetation but limited assessment by a shadow from dense calcification on MV and AV..   Microbiology summarized: 4/13-COVID-19 PCR negative. 4/13-blood cultures with GPC.  BICD with Enterococcus. 4/14-blood cultures NGTD  Assessment & Plan: Sepsis due to Enterococcus bacteremia  Postoperative left foot wound infection-patient had left transmetatarsal amputation on 3/24. -CRP and ESR mildly elevated. -Underwent left transtibial amputation with wound VAC placement on 4/16 by Dr. Sharol Given. -Repeat blood culture on 4/14 NGTD -  TTE without vegetation but severe aortic stenosis -TEE without vegetation but limited assessment by a shadow from dense calcification on MV and AV. -ID recommended vancomycin after each dialysis with last dose on 4/26. -Ortho recommended outpatient follow-up in 1 week -Trend leukocytosis-improving.  Acute metabolic encephalopathy: Likely due to the above.  CT head without acute finding.  No focal neuro  deficit.  Seems to have resolved.  He is fairly oriented. -Manage sepsis/bacteremia as above. -Frequent reorientation and delirium precautions.  ESRD on HD M/W/F at the [facility] HD center Bone marrow disorder Azotemia/hyperkalemia/hyponatremia/Anion gap metabolic acidosis-resolved. Anemia of renal disease-stable. -Nephrology managing-HD 4/19 pushed to 4/20.  Severe aortic stenosis/chronic diastolic CHF: No cardiopulmonary symptoms.  Appears euvolemic. -Cardiology consulted-recommended outpatient follow-up.  TEE on 4/19 for bacteremia. -Fluid management by dialysis -Strict intake and output  Hypotension: Slightly soft blood pressures but not symptomatic. -Continue midodrine on dialysis days  Peripheral vascular disease:  -Resume Plavix  History of nonhemorrhagic CVA in 2000: No apparent neuro deficit on exam. -Continue home medications.   Positive Hemoccult-H&H stable.  Has positive Hemoccult previously as well -Will monitor H&H intermittently.  Hyperlipidemia:  -Continue home atorvastatin  Bandemia/thrombocytosis: Likely reactive from #1.  Improving. -Continue monitoring  Debility -PT/OT recommended SNF.              Pressure injury: not POA.  Stage I-II Pressure Injury 08/15/19 Sacrum (Active)  08/15/19 0300  Location: Sacrum  Location Orientation:   Staging:   Wound Description (Comments):   Present on Admission:      DVT prophylaxis: Subcu heparin Code Status: Full code Family Communication: None at bedside.  Updated patient's daughter over the phone on 4/19  Discharge barrier: Medically stable for discharge to SNF. Patient is from: Home Final disposition: Likely SNF on 4/21  Consultants:  Orthopedic surgery, Dr. Sharol Given Infectious disease Nephrology Cardiology  Sch Meds:  Scheduled Meds: . atorvastatin  40 mg Oral Daily  . Chlorhexidine Gluconate Cloth  6 each Topical Q0600  . cinacalcet  60 mg Oral QPM  . clopidogrel  75 mg Oral  Daily  . docusate sodium  100 mg Oral BID  . doxercalciferol  2.5 mcg Oral Q M,W,F-HD  . melatonin  3 mg Oral QHS  . midodrine  10 mg Oral Q M,W,F  . multivitamin  1 tablet Oral QHS  . saccharomyces boulardii  250 mg Oral BID  . senna-docusate  1 tablet Oral Daily   Continuous Infusions: . vancomycin     PRN Meds:.acetaminophen **OR** acetaminophen, albuterol, HYDROmorphone (DILAUDID) injection, oxyCODONE  Antimicrobials: Anti-infectives (From admission, onward)   Start     Dose/Rate Route Frequency Ordered Stop   08/17/19 1445  Vancomycin (VANCOCIN) 750-5 MG/150ML-% IVPB    Note to Pharmacy: Almira Bar   : cabinet override      08/17/19 1445 08/17/19 1648   08/17/19 1200  vancomycin (VANCOCIN) IVPB 750 mg/150 ml premix     750 mg 150 mL/hr over 60 Minutes Intravenous Every M-W-F (Hemodialysis) 08/17/19 0752 08/23/19 1159   08/13/19 2200  ceFAZolin (ANCEF) IVPB 1 g/50 mL premix     1 g 100 mL/hr over 30 Minutes Intravenous  Once 08/13/19 0947 08/14/19 0321   08/13/19 0738  ceFAZolin (ANCEF) 2-4 GM/100ML-% IVPB    Note to Pharmacy: Clearnce Sorrel   : cabinet override      08/13/19 0738 08/13/19 1944   08/11/19 1200  vancomycin (VANCOCIN) IVPB 750 mg/150 ml premix  Status:  Discontinued  750 mg 150 mL/hr over 60 Minutes Intravenous Every M-W-F (Hemodialysis) 08/11/19 0903 08/17/19 0752   08/11/19 1000  ampicillin (OMNIPEN) 2 g in sodium chloride 0.9 % 100 mL IVPB  Status:  Discontinued     2 g 300 mL/hr over 20 Minutes Intravenous Every 12 hours 08/11/19 0405 08/11/19 0857   08/11/19 0915  vancomycin (VANCOREADY) IVPB 1250 mg/250 mL  Status:  Discontinued     1,250 mg 166.7 mL/hr over 90 Minutes Intravenous  Once 08/11/19 0903 08/11/19 0907   08/11/19 0915  ceFAZolin (ANCEF) IVPB 2g/100 mL premix     2 g 200 mL/hr over 30 Minutes Intravenous On call to O.R. 08/11/19 0909 08/12/19 0559   08/11/19 0000  vancomycin variable dose per unstable renal function (pharmacist  dosing)  Status:  Discontinued      Does not apply See admin instructions 08/10/19 1108 08/11/19 0405   08/10/19 1600  piperacillin-tazobactam (ZOSYN) IVPB 2.25 g  Status:  Discontinued     2.25 g 100 mL/hr over 30 Minutes Intravenous Every 8 hours 08/10/19 1108 08/11/19 0405   08/10/19 1115  vancomycin (VANCOREADY) IVPB 500 mg/100 mL     500 mg 100 mL/hr over 60 Minutes Intravenous NOW 08/10/19 1105 08/10/19 1435   08/10/19 0745  piperacillin-tazobactam (ZOSYN) IVPB 3.375 g     3.375 g 100 mL/hr over 30 Minutes Intravenous  Once 08/10/19 0732 08/10/19 0935   08/10/19 0745  vancomycin (VANCOCIN) IVPB 1000 mg/200 mL premix     1,000 mg 200 mL/hr over 60 Minutes Intravenous  Once 08/10/19 0732 08/10/19 0959       I have personally reviewed the following labs and images: CBC: Recent Labs  Lab 08/12/19 0518 08/12/19 0518 08/13/19 0306 08/13/19 1020 08/14/19 0509 08/16/19 0231 08/17/19 0627  WBC 40.6*   < > 39.0* 40.8* 31.3* 32.7* 30.9*  NEUTROABS 35.8*  --  34.2*  --  26.6*  --   --   HGB 12.1*   < > 11.3* 11.3* 11.0* 10.9* 10.3*  HCT 37.1*   < > 34.5* 33.4* 33.3* 33.2* 31.3*  MCV 61.9*   < > 61.5* 62.1* 61.3* 62.1* 61.6*  PLT 415*   < > 438* 463* 404* 381 334   < > = values in this interval not displayed.   BMP &GFR Recent Labs  Lab 08/11/19 0306 08/11/19 0306 08/11/19 1130 08/11/19 2021 08/13/19 0306 08/13/19 1020 08/14/19 0509  NA 131*   < > 132* 136 136 135 137  K 5.3*   < > 5.5* 3.7 3.9 4.4 4.0  CL 92*   < > 92* 96* 95* 96* 98  CO2 22   < > 20* _0 GLUCOSE 70   < > 70 97 76 88 72  BUN 77*   < > 81* 27* 47* 51* 19  CREATININE 11.38*   < > 11.57* 5.43* 7.49* 7.91* 4.01*  CALCIUM 9.9   < > 9.8 9.1 9.7 9.3 8.3*  MG  --   --   --  1.9 2.0  --  1.9  PHOS 2.5  --   --   --   --  3.0 2.1*   < > = values in this interval not displayed.   Estimated Creatinine Clearance: 13.6 mL/min (A) (by C-G formula based on SCr of 4.01 mg/dL (H)). Liver &  Pancreas: Recent Labs  Lab 08/11/19 0306 08/13/19 1020 08/14/19 0509  ALBUMIN 1.9* 1.8* 1.7*   No results for input(s):  LIPASE, AMYLASE in the last 168 hours. No results for input(s): AMMONIA in the last 168 hours. Diabetic: No results for input(s): HGBA1C in the last 72 hours. Recent Labs  Lab 08/13/19 0833  GLUCAP 86   Cardiac Enzymes: No results for input(s): CKTOTAL, CKMB, CKMBINDEX, TROPONINI in the last 168 hours. No results for input(s): PROBNP in the last 8760 hours. Coagulation Profile: No results for input(s): INR, PROTIME in the last 168 hours. Thyroid Function Tests: No results for input(s): TSH, T4TOTAL, FREET4, T3FREE, THYROIDAB in the last 72 hours. Lipid Profile: No results for input(s): CHOL, HDL, LDLCALC, TRIG, CHOLHDL, LDLDIRECT in the last 72 hours. Anemia Panel: No results for input(s): VITAMINB12, FOLATE, FERRITIN, TIBC, IRON, RETICCTPCT in the last 72 hours. Urine analysis: No results found for: COLORURINE, APPEARANCEUR, LABSPEC, PHURINE, GLUCOSEU, HGBUR, BILIRUBINUR, KETONESUR, PROTEINUR, UROBILINOGEN, NITRITE, LEUKOCYTESUR Sepsis Labs: Invalid input(s): PROCALCITONIN, [place]  Microbiology: Recent Results (from the past 240 hour(s))  Blood culture (routine x 2)     Status: Abnormal   Collection Time: 08/10/19  4:30 AM   Specimen: BLOOD LEFT ARM  Result Value Ref Range Status   Specimen Description BLOOD LEFT ARM  Final   Special Requests   Final    BOTTLES DRAWN AEROBIC AND ANAEROBIC Blood Culture adequate volume   Culture  Setup Time   Final    IN BOTH AEROBIC AND ANAEROBIC BOTTLES GRAM POSITIVE COCCI CRITICAL RESULT CALLED TO, READ BACK BY AND VERIFIED WITH: G. ABBOTT,PHARMD [id number] 08/11/2019 T. TYSOR    Culture (A)  Final    ENTEROCOCCUS FAECALIS SUSCEPTIBILITIES PERFORMED ON PREVIOUS CULTURE WITHIN THE LAST 5 DAYS. Performed at [facility], [place] [address], [place], [place] [address]    Report Status 08/13/2019 FINAL  Final   Blood Culture ID Panel (Reflexed)     Status: Abnormal   Collection Time: 08/10/19  4:30 AM  Result Value Ref Range Status   Enterococcus species DETECTED (A) NOT DETECTED Final    Comment: CRITICAL RESULT CALLED TO, READ BACK BY AND VERIFIED WITH: G. ABBOTT,PHARMD 0248 08/11/2019 T. TYSOR    Vancomycin resistance NOT DETECTED NOT DETECTED Final   Listeria monocytogenes NOT DETECTED NOT DETECTED Final   Staphylococcus species NOT DETECTED NOT DETECTED Final   Staphylococcus aureus (BCID) NOT DETECTED NOT DETECTED Final   Streptococcus species NOT DETECTED NOT DETECTED Final   Streptococcus agalactiae NOT DETECTED NOT DETECTED Final   Streptococcus pneumoniae NOT DETECTED NOT DETECTED Final   Streptococcus pyogenes NOT DETECTED NOT DETECTED Final   Acinetobacter baumannii NOT DETECTED NOT DETECTED Final   Enterobacteriaceae species NOT DETECTED NOT DETECTED Final   Enterobacter cloacae complex NOT DETECTED NOT DETECTED Final   Escherichia coli NOT DETECTED NOT DETECTED Final   Klebsiella oxytoca NOT DETECTED NOT DETECTED Final   Klebsiella pneumoniae NOT DETECTED NOT DETECTED Final   Proteus species NOT DETECTED NOT DETECTED Final   Serratia marcescens NOT DETECTED NOT DETECTED Final   Haemophilus influenzae NOT DETECTED NOT DETECTED Final   Neisseria meningitidis NOT DETECTED NOT DETECTED Final   Pseudomonas aeruginosa NOT DETECTED NOT DETECTED Final   Candida albicans NOT DETECTED NOT DETECTED Final   Candida glabrata NOT DETECTED NOT DETECTED Final   Candida krusei NOT DETECTED NOT DETECTED Final   Candida parapsilosis NOT DETECTED NOT DETECTED Final   Candida tropicalis NOT DETECTED NOT DETECTED Final    Comment: Performed at [facility], [address] [place], [place] [address]  Blood culture (routine x 2)  Status: Abnormal   Collection Time: 08/10/19  4:55 AM   Specimen: BLOOD RIGHT ARM  Result Value Ref Range Status   Specimen Description BLOOD RIGHT ARM  Final    Special Requests   Final    BOTTLES DRAWN AEROBIC ONLY Blood Culture adequate volume   Culture  Setup Time   Final    AEROBIC BOTTLE ONLY GRAM POSITIVE COCCI CRITICAL VALUE NOTED.  VALUE IS CONSISTENT WITH PREVIOUSLY REPORTED AND CALLED VALUE. Performed at [facility], [place] [address], [place], [place] [address]    Culture ENTEROCOCCUS FAECALIS (A)  Final   Report Status 08/13/2019 FINAL  Final   Organism ID, Bacteria ENTEROCOCCUS FAECALIS  Final      Susceptibility   Enterococcus faecalis - MIC*    AMPICILLIN <=2 SENSITIVE Sensitive     VANCOMYCIN <=0.5 SENSITIVE Sensitive     GENTAMICIN SYNERGY SENSITIVE Sensitive     * ENTEROCOCCUS FAECALIS  SARS CORONAVIRUS 2 (TAT 6-24 HRS) Nasopharyngeal Nasopharyngeal Swab     Status: None   Collection Time: 08/10/19  9:59 AM   Specimen: Nasopharyngeal Swab  Result Value Ref Range Status   SARS Coronavirus 2 NEGATIVE NEGATIVE Final    Comment: (NOTE) SARS-CoV-2 target nucleic acids are NOT DETECTED. The SARS-CoV-2 RNA is generally detectable in upper and lower respiratory specimens during the acute phase of infection. Negative results do not preclude SARS-CoV-2 infection, do not rule out co-infections with other pathogens, and should not be used as the sole basis for treatment or other patient management decisions. Negative results must be combined with clinical observations, patient history, and epidemiological information. The expected result is Negative. Fact Sheet for Patients: SugarRoll.be Fact Sheet for Healthcare Providers: https://www.woods-mathews.com/ This test is not yet approved or cleared by the [place] FDA and  has been authorized for detection and/or diagnosis of SARS-CoV-2 by FDA under an Emergency Use Authorization (EUA). This EUA will remain  in effect (meaning this test can be used) for the duration of the COVID-19 declaration under Section 56 4(b)(1) of the Act, 21  U.S.C. section 360bbb-3(b)(1), unless the authorization is terminated or revoked sooner. Performed at [facility], [place] [address], [place], [place] [address]   Culture, blood (routine x 2)     Status: None   Collection Time: 08/11/19 10:36 AM   Specimen: BLOOD RIGHT HAND  Result Value Ref Range Status   Specimen Description BLOOD RIGHT HAND  Final   Special Requests   Final    BOTTLES DRAWN AEROBIC ONLY Blood Culture adequate volume   Culture   Final    NO GROWTH 5 DAYS Performed at [facility], [place] [address] [place], [place] [address]    Report Status 08/16/2019 FINAL  Final  Culture, blood (routine x 2)     Status: None   Collection Time: 08/11/19 10:39 AM   Specimen: BLOOD RIGHT HAND  Result Value Ref Range Status   Specimen Description BLOOD RIGHT HAND  Final   Special Requests   Final    BOTTLES DRAWN AEROBIC ONLY Blood Culture results may not be optimal due to an inadequate volume of blood received in culture bottles   Culture   Final    NO GROWTH 5 DAYS Performed at [facility], [place] [address], [place], [place] [address]    Report Status 08/16/2019 FINAL  Final    Radiology Studies: No results found.    Zoltan Genest T. [place]  If 7PM-7AM,  please contact night-coverage [email or web address] Password [facility] 08/17/2019, 6:31 PM

## 2019-08-17 NOTE — Care Management Important Message (Signed)
Important Message  Patient Details  Name: Joseph LUSE Sr. MRN: [id number] Date of Birth: 11/21/1940   Medicare Important Message Given:  Yes     Shelda Altes 08/17/2019, 10:37 AM

## 2019-08-17 NOTE — Progress Notes (Signed)
Pt came back to rm [id number] from dialysis. VSS. Pt alert.  Lavenia Atlas, RN

## 2019-08-17 NOTE — Progress Notes (Signed)
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 TEE negative for vegetation . Rec Vanc through 4/26 at HD . HD pushed to today  04/19 0701 - 04/20 0700 In: 430 [P.O.:180; I.V.:250] Out: 0   Filed Weights   08/15/19 0350 08/16/19 0323 08/17/19 0358  Weight: 63.4 kg 62.6 kg 65.1 kg    Scheduled Meds: . atorvastatin  40 mg Oral Daily  . Chlorhexidine Gluconate Cloth  6 each Topical Q0600  . cinacalcet  60 mg Oral QPM  . docusate sodium  100 mg Oral BID  . doxercalciferol  2.5 mcg Oral Q M,W,F-HD  . melatonin  3 mg Oral QHS  . midodrine  10 mg Oral Q M,W,F  . multivitamin  1 tablet Oral QHS  . saccharomyces boulardii  250 mg Oral BID  . senna-docusate  1 tablet Oral Daily   Continuous Infusions: . sodium chloride Stopped (08/13/19 1817)  . vancomycin     PRN Meds:.acetaminophen **OR** acetaminophen, albuterol, HYDROmorphone (DILAUDID) injection, oxyCODONE  Current Labs: reviewed    Physical Exam:  Blood pressure (!) 100/54, pulse 84, temperature 98.3 F (36.8 C), temperature source Oral, resp. rate 18, height 5\' 5"  (1.651 m), weight 65.1 kg, SpO2 100 %. chronicall ill apparing LLE AVG +B/T Regular nl s1s2 S/nt/nd L BKA site with wound vac CTAB Mild confusion  Dialysis prescription: Monday/Wednesday/Friday, 4 hours, BFR 400/DFR 800, 2K/2.5 calcium, heparin 6000 units, left femoral loop graft, EDW 66 kg.  [name] mcg 3 times weekly, Sensipar 90 mg daily.  A 1. ESRD MWF [facility] LUE AVG 2. 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 AMS likely from #2 and #4, improving  4. Enterococcus bacteremia; Vanc, RCID following; TEE 4/19 negative for IE; Vanc dosed with HD through 08/23/19 5. Leukocytosis, slowly improving 6. HTN 7. PVD 8. Hx/o CVA 9. 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 Aortic stenosis, severe  P . HD off schedule today: 3.5h,  400/800 LLE AVG no heparin, 2L UF . Cont on THS Schedule this week, back to MWF next week  . Medication Issues; o Preferred narcotic agents for pain control are hydromorphone, fentanyl, and methadone. Morphine should not be used.  o Baclofen should be avoided o Avoid oral sodium phosphate and magnesium citrate based laxatives / bowel preps    Pearson Grippe MD 08/17/2019, 11:10 AM  Recent Labs  Lab 08/11/19 0306 08/11/19 1130 08/13/19 0306 08/13/19 1020 08/14/19 0509  NA 131*   < > 136 135 137  K 5.3*   < > 3.9 4.4 4.0  CL 92*   < > 95* 96* 98  CO2 22   < > 26 22 25   GLUCOSE 70   < > 76 88 72  BUN 77*   < > 47* 51* 19  CREATININE 11.38*   < > 7.49* 7.91* 4.01*  CALCIUM 9.9   < > 9.7 9.3 8.3*  PHOS 2.5  --   --  3.0 2.1*   < > = values in this interval not displayed.   Recent Labs  Lab 08/12/19 0518 08/12/19 0518 08/13/19 0306 08/13/19 1020 08/14/19 0509 08/16/19 0231 08/17/19 0627  WBC 40.6*   < > 39.0*   < > 31.3* 32.7* 30.9*  NEUTROABS 35.8*  --  34.2*  --  26.6*  --   --  HGB 12.1*   < > 11.3*   < > 11.0* 10.9* 10.3*  HCT 37.1*   < > 34.5*   < > 33.3* 33.2* 31.3*  MCV 61.9*   < > 61.5*   < > 61.3* 62.1* 61.6*  PLT 415*   < > 438*   < > 404* 381 334   < > = values in this interval not displayed.

## 2019-08-17 NOTE — TOC Progression Note (Signed)
Transition of Care (TOC) - Progression Note    Patient Details  Name: Joseph FINKLEA Sr. MRN: [id number] Date of Birth: 1940/12/04  Transition of Care [facility]) CM/SW [place], [place] Phone Number: 08/17/2019, 4:29 PM  Clinical Narrative:     CSW informed SNF/[facility] of potential discharge tomorrow.  Requested covid test-MD informed.   CSW will continue to follow and assist with discharge planning.  Thurmond Butts, MSW, [place] Clinical Social Worker   Expected Discharge Plan: [place]    Expected Discharge Plan and Services Expected Discharge Plan: [place] arrangements for the past 2 months: [place]                                       Social Determinants of Health (SDOH) Interventions    Readmission Risk Interventions No flowsheet data found.

## 2019-08-17 NOTE — Progress Notes (Signed)
Pharmacy Antibiotic Note  [name]. is a 79 y.o. male admitted on 08/09/2019 with bacteremia.  Pharmacy has been consulted for Vancomycin dosing.  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 TEE without vegetation. -  Hep B negative - Vanco for ease of administration with HD. - Afeb, WBC 30.9   Vanc 4/13 >>(4/26)  Zosyn 4/13>>4/14  Bactroban/CHG 4/14>> (4/18)  Cefazolin pre-op 4/16 >> x 1 post-op post-HD (Rx adj'd)  3/24 MRSA PCR: positive 4/13 Blood x 2: Enterococcus, sens Amp, Vanc MIC <0.5 4/14 blood x 2: ng x 2 days to date 4/13 COVID: negative  Plan: - Vanco 750mg  MWF. HD off schedule 4/20. Scheduled Vanco dose today and call HD to notify to give dose off-scheduled - Stop date 4/27 - last dose 4/26  - ?resume Plavix?    Height: 5\' 5"  (165.1 cm) Weight: 65.1 kg (143 lb 8.3 oz) IBW/kg (Calculated) : 61.5  Temp (24hrs), Avg:98.1 F (36.7 C), Min:97.7 F (36.5 C), Max:98.5 F (36.9 C)  Recent Labs  Lab 08/10/19 0822 08/10/19 1121 08/10/19 1134 08/11/19 1130 08/11/19 2021 08/12/19 0518 08/13/19 0306 08/13/19 1020 08/14/19 0509 08/16/19 0231 08/17/19 0627  WBC  --   --    < > 38.1*  --    < > 39.0* 40.8* 31.3* 32.7* 30.9*  CREATININE  --   --    < > 11.57* 5.43*  --  7.49* 7.91* 4.01*  --   --   LATICACIDVEN 2.4* 2.9*  --   --   --   --   --   --   --   --   --    < > = values in this interval not displayed.    Estimated Creatinine Clearance: 13.6 mL/min (A) (by C-G formula based on SCr of 4.01 mg/dL (H)).    Allergies  Allergen Reactions  . Aspirin Other (See Comments)    Causes internal bleeding  History of ulcers   [name] Highland, PharmD, BCPS Clinical Staff Pharmacist [email or web address] [place] 08/17/2019 7:55 AM

## 2019-08-17 NOTE — Progress Notes (Addendum)
The patient has been seen in conjunction with Vin Bhagat, PAC. All aspects of care have been considered and discussed. The patient has been personally interviewed, examined, and all clinical data has been reviewed.   Echo findings noted.  He had AV conduction abnormality during and after transesophageal echo performed yesterday.  May need to a 2 to 4-week monitor as an outpatient.  Plans for TAVR anytime soon and may not ultimately be a candidate due to comorbidities.  Studies need to be formally discussed.  Clearly needs to get over bacteremia without recurrence before any further discussion.   Progress Note  Patient Name: Joseph DEMURO Sr. Date of Encounter: 08/17/2019  Primary Cardiologist: Rozann Lesches, MD  Subjective   Confused and lethargic.   Inpatient Medications    Scheduled Meds: . atorvastatin  40 mg Oral Daily  . Chlorhexidine Gluconate Cloth  6 each Topical Q0600  . cinacalcet  60 mg Oral QPM  . docusate sodium  100 mg Oral BID  . doxercalciferol  2.5 mcg Oral Q M,W,F-HD  . melatonin  3 mg Oral QHS  . midodrine  10 mg Oral Q M,W,F  . multivitamin  1 tablet Oral QHS  . saccharomyces boulardii  250 mg Oral BID  . senna-docusate  1 tablet Oral Daily   Continuous Infusions: . sodium chloride Stopped (08/13/19 1817)  . vancomycin     PRN Meds: acetaminophen **OR** acetaminophen, albuterol, HYDROmorphone (DILAUDID) injection, oxyCODONE   Vital Signs    Vitals:   08/16/19 1643 08/16/19 2143 08/17/19 0358 08/17/19 0750  BP: (!) 106/48 (!) 107/52 (!) 108/52 (!) 100/54  Pulse: 86 81 86 84  Resp: 19  18   Temp: 97.9 F (36.6 C) 97.7 F (36.5 C) 98.5 F (36.9 C) 98.3 F (36.8 C)  TempSrc: Oral Oral Oral Oral  SpO2: 100% 100% 100% 100%  Weight:   65.1 kg   Height:        Intake/Output Summary (Last 24 hours) at 08/17/2019 0928 Last data filed at 08/17/2019 0526 Gross per 24 hour  Intake 430 ml  Output 0 ml  Net 430 ml   Last 3 Weights 08/17/2019  08/16/2019 08/15/2019  Weight (lbs) 143 lb 8.3 oz 138 lb 0.1 oz 139 lb 12.4 oz  Weight (kg) 65.1 kg 62.6 kg 63.4 kg      Telemetry    NSR, PVCs and brief SVT - Personally Reviewed  ECG    No new tracing   Physical Exam   GEN: elderly ill appearing male in no acute distress.   Neck: No JVD Cardiac: RRR, 4/6 systolic murmurs, rubs, or gallops.  Respiratory: Clear to auscultation bilaterally. GI: Soft, nontender, non-distended  MS: S/p L BKA Neuro:  Confused and lethargic, mild response with voice  Psych: Lethergic  Labs    High Sensitivity Troponin:  No results for input(s): TROPONINIHS in the last 720 hours.    Chemistry Recent Labs  Lab 08/11/19 0306 08/11/19 1130 08/13/19 0306 08/13/19 1020 08/14/19 0509  NA 131*   < > 136 135 137  K 5.3*   < > 3.9 4.4 4.0  CL 92*   < > 95* 96* 98  CO2 22   < > 26 22 25   GLUCOSE 70   < > 76 88 72  BUN 77*   < > 47* 51* 19  CREATININE 11.38*   < > 7.49* 7.91* 4.01*  CALCIUM 9.9   < > 9.7 9.3 8.3*  ALBUMIN 1.9*  --   --  1.8* 1.7*  GFRNONAA 4*   < > 6* 6* 14*  GFRAA 4*   < > 7* 7* 16*  ANIONGAP 17*   < > 15 17* 14   < > = values in this interval not displayed.     Hematology Recent Labs  Lab 08/14/19 0509 08/16/19 0231 08/17/19 0627  WBC 31.3* 32.7* 30.9*  RBC 5.43 5.35 5.08  HGB 11.0* 10.9* 10.3*  HCT 33.3* 33.2* 31.3*  MCV 61.3* 62.1* 61.6*  MCH 20.3* 20.4* 20.3*  MCHC 33.0 32.8 32.9  RDW 18.7* 18.9* 18.8*  PLT 404* 381 334    BNPNo results for input(s): BNP, PROBNP in the last 168 hours.   DDimer No results for input(s): DDIMER in the last 168 hours.   Radiology    No results found.  Cardiac Studies   TEE 08/16/19 FINDINGS:  Severe aortic valve stenosis, moderate aortic valve regurgitation.  Mild MR. Mild-moderate TR. 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  No valvular vegetations noted, though dense calcification on mitral and aortic valve limit assessment  slightly from shadowing.   RECOMMENDATIONS:     return to hospital bed when stable.   TTE 08/12/19 1. Since the last study on on 12/08/2018 aortic stenosis is now severe.  2. Left ventricular ejection fraction, by estimation, is 60 to 65%. The  left ventricle has normal function. The left ventricle has no regional  wall motion abnormalities. There is mild concentric left ventricular  hypertrophy. Left ventricular diastolic  parameters are consistent with Grade I diastolic dysfunction (impaired  relaxation).  3. Right ventricular systolic function is normal. The right ventricular  size is normal.  4. The mitral valve is normal in structure. Mild mitral valve  regurgitation. No evidence of mitral stenosis.  5. The aortic valve is normal in structure. Aortic valve regurgitation is  not visualized. Severe aortic valve stenosis. Aortic valve mean gradient  measures 47.0 mmHg.  6. The inferior vena cava is normal in size with greater than 50%  respiratory variability, suggesting right atrial pressure of 3 mmHg.   Conclusion(s)/Recommendation(s): No evidence of valvular vegetations on  this transthoracic echocardiogram. Would recommend a transesophageal  echocardiogram to exclude infective endocarditis if clinically indicated.   Patient Profile     79 y.o. 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   Assessment & Plan    1. Severe Aortic stenosis - Echo with Severe aortic valve stenosis. Aortic valve mean gradient  measures 47.0 mmHg. Aortic valve peak gradient measures 62.7  mmHg. Aortic valve area, by VTI measures 0.63 cm.  - TEE with severe AS, no vegetations.  - Seems not a candidate for AVR or even TVAR currently   2. Bacteremia - No vegetations by TEE - per ID  3. ESRD on HD 4. AMS 5. 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

## 2019-08-17 NOTE — Progress Notes (Signed)
S/P Left bKA POD 4. Alert lying in bed. Has had some issues with phantom pain   Vac in place good seal. 0cc in canister. Plan for SNF

## 2019-08-18 LAB — CBC
HCT: 33.1 % — ABNORMAL LOW (ref 39.0–52.0)
Hemoglobin: 11 g/dL — ABNORMAL LOW (ref 13.0–17.0)
MCH: 20.6 pg — ABNORMAL LOW (ref 26.0–34.0)
MCHC: 33.2 g/dL (ref 30.0–36.0)
MCV: 62 fL — ABNORMAL LOW (ref 80.0–100.0)
Platelets: 348 10*3/uL (ref 150–400)
RBC: 5.34 MIL/uL (ref 4.22–5.81)
RDW: 19.1 % — ABNORMAL HIGH (ref 11.5–15.5)
WBC: 27.6 10*3/uL — ABNORMAL HIGH (ref 4.0–10.5)
nRBC: 0 % (ref 0.0–0.2)

## 2019-08-18 LAB — COMPREHENSIVE METABOLIC PANEL
ALT: <5 U/L (ref 0–44)
AST: 82 U/L — ABNORMAL HIGH (ref 15–41)
Albumin: 1.5 g/dL — ABNORMAL LOW (ref 3.5–5.0)
Alkaline Phosphatase: 58 U/L (ref 38–126)
Anion gap: 14 (ref 5–15)
BUN: 25 mg/dL — ABNORMAL HIGH (ref 8–23)
CO2: 22 mmol/L (ref 22–32)
Calcium: 8.2 mg/dL — ABNORMAL LOW (ref 8.9–10.3)
Chloride: 101 mmol/L (ref 98–111)
Creatinine, Ser: 5.1 mg/dL — ABNORMAL HIGH (ref 0.61–1.24)
GFR calc Af Amer: 12 mL/min — ABNORMAL LOW (ref >60)
GFR calc non Af Amer: 10 mL/min — ABNORMAL LOW (ref >60)
Glucose, Bld: 96 mg/dL (ref 70–99)
Potassium: 4.4 mmol/L (ref 3.5–5.1)
Sodium: 137 mmol/L (ref 135–145)
Total Bilirubin: 0.8 mg/dL (ref 0.3–1.2)
Total Protein: 6.3 g/dL — ABNORMAL LOW (ref 6.5–8.1)

## 2019-08-18 LAB — HEMOGLOBIN A1C
Hgb A1c MFr Bld: 6 % — ABNORMAL HIGH (ref 4.8–5.6)
Mean Plasma Glucose: 125.5 mg/dL

## 2019-08-18 LAB — MAGNESIUM: Magnesium: 1.7 mg/dL (ref 1.7–2.4)

## 2019-08-18 MED ORDER — SODIUM CHLORIDE 0.9 % IV BOLUS
500.0000 mL | Freq: Once | INTRAVENOUS | Status: AC
  Administered 2019-08-18: 500 mL via INTRAVENOUS

## 2019-08-18 MED ORDER — MIDODRINE HCL 5 MG PO TABS
10.0000 mg | ORAL_TABLET | Freq: Three times a day (TID) | ORAL | Status: DC
  Administered 2019-08-18 – 2019-08-20 (×7): 10 mg via ORAL
  Filled 2019-08-18 (×7): qty 2

## 2019-08-18 NOTE — Progress Notes (Addendum)
PROGRESS NOTE    [name]  FYB:[id number] DOB: Mar 10, 1941 DOA: 08/09/2019 PCP: [name], MD   Chief Complaint  Patient presents with  . Post-op Problem    Brief Narrative:  79 year old male with history of ESRD on HD MWF, diastolic CHF, moderate AS, PVD, HTN, IDA and bilateral transmetatarsal amputation brought to ED due to altered mental status during dialysis.  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  Admitted for sepsis in the setting of postoperative left foot infection and found to have Enterococcus bacteremia.  ID, orthopedic surgery (Dr. Sharol Given) and nephrology following. Patient underwent left transtibial amputation by Dr. Sharol Given on 4/16. Repeat blood culture negative.  TTE without vegetation but severe aortic stenosis.  Cardiology consulted for TEE and evaluation of aortic stenosis.    Patient underwent TEE on 08/16/2019 that showed aortic valve stenosis with moderate AVR, mild MR, mild to moderate TR, severe atherosclerotic plaque in descending aorta and aortic arch but no valvular vegetation.   ID recommended vancomycin after each dialysis through April 27 (last dose after HD on Monday 4/26).  Patient to be discharged to SNF with wound VAC and follow-up with orthopedic surgery in [place] week.  Assessment & Plan:   Principal Problem:   Bacteremia due to Enterococcus Active Problems:   PVD (peripheral vascular disease) (HCC)   Gangrene of left foot (HCC)   Postoperative wound infection   Prolonged QT interval   Acute metabolic encephalopathy   Thrombocytosis (HCC)   Hyperkalemia   Left foot infection  Sepsis due to Enterococcus bacteremia  Postoperative left foot wound infection -patient had left transmetatarsal amputation on 3/24. -CRP and ESR mildly elevated. -Underwent left transtibial amputation with wound VAC placement on 4/16 by Dr. Sharol Given. -Repeat blood culture on 4/14 NGTD x 5 -TTE without vegetation but  severe aortic stenosis -TEE without vegetation but limited assessment by Arnika Larzelere shadow from dense calcification on MV and AV. -ID recommended vancomycin after each dialysis with last dose on 4/26. -Ortho recommended outpatient follow-up in 1 week -Trend leukocytosis-slow, but gradual improvement.  Hypotension Seems worse today than it has been, was receiving midodrine on dialysis days.  SBP in the 70's today, follow with IVF, increase midodrine to TID Continue to monitor, he's Dantonio Justen&O without complaint of lightheadedness, dizziness, CP, SOB, etc + orthostatics -> repeat small bolus, TSH wnl, follow am cortisol  Acute metabolic encephalopathy: Likely due to the above.  CT head without acute finding.  No focal neuro deficit.  Seems to have resolved.  He is fairly oriented. -Manage sepsis/bacteremia as above. -Frequent reorientation and delirium precautions.  ESRD on HD M/W[facility] Bone marrow disorder Azotemia/hyperkalemia/hyponatremia/Anion gap metabolic acidosis-resolved. Anemia of renal disease-stable. Nephrology c/s, appreciate recs  Severe aortic stenosis/chronic diastolic CHF: No cardiopulmonary symptoms.  Appears euvolemic. -Cardiology consulted-recommended outpatient follow-up after bacteremia is treated.  TEE on 4/19 for bacteremia. -Fluid management by dialysis -Strict intake and output  Frequent PVCs: continue to monitor, EF 60-65%, BP precludes beta blocker at this time  Peripheral vascular disease:  -Resume Plavix  History of nonhemorrhagic CVA in 2000: No apparent neuro deficit on exam. -Continue home medications.   Positive Hemoccult-H&H stable.  -Will monitor H&H intermittently - needs outpatient GI follow up  Hyperlipidemia:  -Continue home atorvastatin  Bandemia/thrombocytosis: Likely reactive from #1.  Improving. -Continue monitoring  Debility -PT/OT recommended SNF.  Pressure injury Pressure Injury 08/15/19 Sacrum (Active)    08/15/19 0300  Location: Sacrum  Location Orientation:   Staging:   Wound Description (Comments):   Present on Admission:    DVT prophylaxis: SCD Code Status: full Family Communication: none at bedside Disposition:   Status is: Inpatient  Remains inpatient appropriate because:Inpatient level of care appropriate due to severity of illness, hypotension requiring IVF and adjustment in midodrine dosing   Dispo: The patient is from: [facility]              Anticipated d/c is to: SNF              Anticipated d/c date is: 1 day              Patient currently is not medically stable to d/c.   Consultants:   Renal  Cardiology  ID  Procedures:  4/16-left transtibial amputation with wound VAC placement  Echo 4/15 IMPRESSIONS    1. Since the last study on on 12/08/2018 aortic stenosis is now severe.  2. Left ventricular ejection fraction, by estimation, is 60 to 65%. The  left ventricle has normal function. The left ventricle has no regional  wall motion abnormalities. There is mild concentric left ventricular  hypertrophy. Left ventricular diastolic  parameters are consistent with Grade I diastolic dysfunction (impaired  relaxation).  3. Right ventricular systolic function is normal. The right ventricular  size is normal.  4. The mitral valve is normal in structure. Mild mitral valve  regurgitation. No evidence of mitral stenosis.  5. The aortic valve is normal in structure. Aortic valve regurgitation is  not visualized. Severe aortic valve stenosis. Aortic valve mean gradient  measures 47.0 mmHg.  6. The inferior vena cava is normal in size with greater than 50%  respiratory variability, suggesting right atrial pressure of 3 mmHg.   TEE IMPRESSIONS    1. Left ventricular ejection fraction, by estimation, is 55 to 60%. The  left ventricle has normal function. Left ventricular endocardial border  not optimally defined to evaluate regional wall motion.  There is mild left  ventricular hypertrophy.  2. Right ventricular systolic function is normal. The right ventricular  size is normal.  3. Left atrial size was mildly dilated. No left atrial/left atrial  appendage thrombus was detected. The LAA emptying velocity was 69 cm/s.  4. The mitral valve is degenerative. Mild mitral valve regurgitation.  5. The tricuspid valve is degenerative. Tricuspid valve regurgitation is  mild to moderate.  6. The aortic valve is severely calcified and restricted. Aortic valve  regurgitation is moderate. Severe aortic valve stenosis. Aortic valve  area, by VTI measures 0.69 cm. Aortic valve mean gradient measures 42.0  mmHg.  7. There is Severe (Grade IV) atheroma plaque involving the transverse  and descending aorta.  8. Possible tiny PFO with scant color flow seen between atrial septal  flap, with redundant atrial septum. Agitated saline contrast bubble study  was negative, with no evidence of any interatrial shunt.   Conclusion(s)/Recommendation(s): No evidence of vegetation/infective  endocarditis on this transesophageal  echocardiogram.   Antimicrobials:  Anti-infectives (From admission, onward)   Start     Dose/Rate Route Frequency Ordered Stop   08/17/19 1445  Vancomycin (VANCOCIN) 750-5 MG/150ML-% IVPB    Note to Pharmacy: Almira Bar   : cabinet override      08/17/19 1445 08/17/19 1648   08/17/19 1200  vancomycin (VANCOCIN) IVPB 750 mg/150 ml premix     750 mg 150 mL/hr over 60 Minutes Intravenous Every M-W-F (Hemodialysis) 08/17/19 0752 08/23/19 1159   08/13/19  2200  ceFAZolin (ANCEF) IVPB 1 g/50 mL premix     1 g 100 mL/hr over 30 Minutes Intravenous  Once 08/13/19 0947 08/14/19 0321   08/13/19 0738  ceFAZolin (ANCEF) 2-4 GM/100ML-% IVPB    Note to Pharmacy: Clearnce Sorrel   : cabinet override      08/13/19 0738 08/13/19 1944   08/11/19 1200  vancomycin (VANCOCIN) IVPB 750 mg/150 ml premix  Status:  Discontinued     750  mg 150 mL/hr over 60 Minutes Intravenous Every M-W-F (Hemodialysis) 08/11/19 0903 08/17/19 0752   08/11/19 1000  ampicillin (OMNIPEN) 2 g in sodium chloride 0.9 % 100 mL IVPB  Status:  Discontinued     2 g 300 mL/hr over 20 Minutes Intravenous Every 12 hours 08/11/19 0405 08/11/19 0857   08/11/19 0915  vancomycin (VANCOREADY) IVPB 1250 mg/250 mL  Status:  Discontinued     1,250 mg 166.7 mL/hr over 90 Minutes Intravenous  Once 08/11/19 0903 08/11/19 0907   08/11/19 0915  ceFAZolin (ANCEF) IVPB 2g/100 mL premix     2 g 200 mL/hr over 30 Minutes Intravenous On call to O.R. 08/11/19 0909 08/12/19 0559   08/11/19 0000  vancomycin variable dose per unstable renal function (pharmacist dosing)  Status:  Discontinued      Does not apply See admin instructions 08/10/19 1108 08/11/19 0405   08/10/19 1600  piperacillin-tazobactam (ZOSYN) IVPB 2.25 g  Status:  Discontinued     2.25 g 100 mL/hr over 30 Minutes Intravenous Every 8 hours 08/10/19 1108 08/11/19 0405   08/10/19 1115  vancomycin (VANCOREADY) IVPB 500 mg/100 mL     500 mg 100 mL/hr over 60 Minutes Intravenous NOW 08/10/19 1105 08/10/19 1435   08/10/19 0745  piperacillin-tazobactam (ZOSYN) IVPB 3.375 g     3.375 g 100 mL/hr over 30 Minutes Intravenous  Once 08/10/19 0732 08/10/19 0935   08/10/19 0745  vancomycin (VANCOCIN) IVPB 1000 mg/200 mL premix     1,000 mg 200 mL/hr over 60 Minutes Intravenous  Once 08/10/19 0732 08/10/19 0959     Subjective: No new complaints No LH, dizziness, CP, or SOB  Objective: Vitals:   08/18/19 1200 08/18/19 1220 08/18/19 1445 08/18/19 1500  BP: 91/74  (!) 107/42 (!) 96/49  Pulse: 93     Resp: (!) 23 20    Temp: 97.6 F (36.4 C)     TempSrc: Oral     SpO2: 98%     Weight:      Height:        Intake/Output Summary (Last 24 hours) at 08/18/2019 1618 Last data filed at 08/18/2019 0904 Gross per 24 hour  Intake 870 ml  Output 404 ml  Net 466 ml   Filed Weights   08/17/19 1631 08/18/19 0430  08/18/19 0500  Weight: 62.7 kg 61.7 kg 61.7 kg    Examination:  General exam: Appears calm and comfortable  Respiratory system: Clear to auscultation. Respiratory effort normal. Cardiovascular system: S1 & S2 heard, RRR. Gastrointestinal system: Abdomen is nondistended, soft and nontender.  Central nervous system: Alert and oriented. No focal neurological deficits. Extremities: L transtibial amputation with wound vac Skin: No rashes, lesions or ulcers Psychiatry: Judgement and insight appear normal. Mood & affect appropriate.     Data Reviewed: I have personally reviewed following labs and imaging studies  CBC: Recent Labs  Lab 08/12/19 0518 08/12/19 0518 08/13/19 0306 08/13/19 0306 08/13/19 1020 08/14/19 0509 08/16/19 0231 08/17/19 0627 08/18/19 0240  WBC 40.6*   < >  39.0*   < > 40.8* 31.3* 32.7* 30.9* 27.6*  NEUTROABS 35.8*  --  34.2*  --   --  26.6*  --   --   --   HGB 12.1*   < > 11.3*   < > 11.3* 11.0* 10.9* 10.3* 11.0*  HCT 37.1*   < > 34.5*   < > 33.4* 33.3* 33.2* 31.3* 33.1*  MCV 61.9*   < > 61.5*   < > 62.1* 61.3* 62.1* 61.6* 62.0*  PLT 415*   < > 438*   < > 463* 404* 381 334 348   < > = values in this interval not displayed.    Basic Metabolic Panel: Recent Labs  Lab 08/11/19 2021 08/13/19 0306 08/13/19 1020 08/14/19 0509 08/18/19 1025  NA 136 136 135 137 137  K 3.7 3.9 4.4 4.0 4.4  CL 96* 95* 96* 98 101  CO2 _0 GLUCOSE 97 76 88 72 96  BUN 27* 47* 51* 19 25*  CREATININE 5.43* 7.49* 7.91* 4.01* 5.10*  CALCIUM 9.1 9.7 9.3 8.3* 8.2*  MG 1.9 2.0  --  1.9 1.7  PHOS  --   --  3.0 2.1*  --     GFR: Estimated Creatinine Clearance: 10.7 mL/min (Luman Holway) (by C-G formula based on SCr of 5.1 mg/dL (H)).  Liver Function Tests: Recent Labs  Lab 08/13/19 1020 08/14/19 0509 08/18/19 1025  AST  --   --  82*  ALT  --   --  <5  ALKPHOS  --   --  58  BILITOT  --   --  0.8  PROT  --   --  6.3*  ALBUMIN 1.8* 1.7* 1.5*    CBG: Recent Labs  Lab  08/13/19 0833  GLUCAP 86     Recent Results (from the past 240 hour(s))  Blood culture (routine x 2)     Status: Abnormal   Collection Time: 08/10/19  4:30 AM   Specimen: BLOOD LEFT ARM  Result Value Ref Range Status   Specimen Description BLOOD LEFT ARM  Final   Special Requests   Final    BOTTLES DRAWN AEROBIC AND ANAEROBIC Blood Culture adequate volume   Culture  Setup Time   Final    IN BOTH AEROBIC AND ANAEROBIC BOTTLES GRAM POSITIVE COCCI CRITICAL RESULT CALLED TO, READ BACK BY AND VERIFIED WITH: G. ABBOTT,PHARMD [id number] 08/11/2019 T. TYSOR    Culture (Morgan Rennert)  Final    ENTEROCOCCUS FAECALIS SUSCEPTIBILITIES PERFORMED ON PREVIOUS CULTURE WITHIN THE LAST 5 DAYS. Performed at [facility], [place] [address], [place], [place] [address]    Report Status 08/13/2019 FINAL  Final  Blood Culture ID Panel (Reflexed)     Status: Abnormal   Collection Time: 08/10/19  4:30 AM  Result Value Ref Range Status   Enterococcus species DETECTED (Carvin Almas) NOT DETECTED Final    Comment: CRITICAL RESULT CALLED TO, READ BACK BY AND VERIFIED WITH: G. ABBOTT,PHARMD [id number] 08/11/2019 T. TYSOR    Vancomycin resistance NOT DETECTED NOT DETECTED Final   Listeria monocytogenes NOT DETECTED NOT DETECTED Final   Staphylococcus species NOT DETECTED NOT DETECTED Final   Staphylococcus aureus (BCID) NOT DETECTED NOT DETECTED Final   Streptococcus species NOT DETECTED NOT DETECTED Final   Streptococcus agalactiae NOT DETECTED NOT DETECTED Final   Streptococcus pneumoniae NOT DETECTED NOT DETECTED Final   Streptococcus pyogenes NOT DETECTED NOT DETECTED Final   Acinetobacter baumannii NOT DETECTED NOT DETECTED Final  Enterobacteriaceae species NOT DETECTED NOT DETECTED Final   Enterobacter cloacae complex NOT DETECTED NOT DETECTED Final   Escherichia coli NOT DETECTED NOT DETECTED Final   Klebsiella oxytoca NOT DETECTED NOT DETECTED Final   Klebsiella pneumoniae NOT DETECTED NOT DETECTED Final   Proteus species  NOT DETECTED NOT DETECTED Final   Serratia marcescens NOT DETECTED NOT DETECTED Final   Haemophilus influenzae NOT DETECTED NOT DETECTED Final   Neisseria meningitidis NOT DETECTED NOT DETECTED Final   Pseudomonas aeruginosa NOT DETECTED NOT DETECTED Final   Candida albicans NOT DETECTED NOT DETECTED Final   Candida glabrata NOT DETECTED NOT DETECTED Final   Candida krusei NOT DETECTED NOT DETECTED Final   Candida parapsilosis NOT DETECTED NOT DETECTED Final   Candida tropicalis NOT DETECTED NOT DETECTED Final    Comment: Performed at [facility], [place] [address] [place], [place] [address]  Blood culture (routine x 2)     Status: Abnormal   Collection Time: 08/10/19  4:55 AM   Specimen: BLOOD RIGHT ARM  Result Value Ref Range Status   Specimen Description BLOOD RIGHT ARM  Final   Special Requests   Final    BOTTLES DRAWN AEROBIC ONLY Blood Culture adequate volume   Culture  Setup Time   Final    AEROBIC BOTTLE ONLY GRAM POSITIVE COCCI CRITICAL VALUE NOTED.  VALUE IS CONSISTENT WITH PREVIOUSLY REPORTED AND CALLED VALUE. Performed at [facility], [place] [address] [place], [place] [address]    Culture ENTEROCOCCUS FAECALIS (Cressie Betzler)  Final   Report Status 08/13/2019 FINAL  Final   Organism ID, Bacteria ENTEROCOCCUS FAECALIS  Final      Susceptibility   Enterococcus faecalis - MIC*    AMPICILLIN <=2 SENSITIVE Sensitive     VANCOMYCIN <=0.5 SENSITIVE Sensitive     GENTAMICIN SYNERGY SENSITIVE Sensitive     * ENTEROCOCCUS FAECALIS  SARS CORONAVIRUS 2 (TAT 6-24 HRS) Nasopharyngeal Nasopharyngeal Swab     Status: None   Collection Time: 08/10/19  9:59 AM   Specimen: Nasopharyngeal Swab  Result Value Ref Range Status   SARS Coronavirus 2 NEGATIVE NEGATIVE Final    Comment: (NOTE) SARS-CoV-2 target nucleic acids are NOT DETECTED. The SARS-CoV-2 RNA is generally detectable in upper and lower respiratory specimens during the acute phase of infection. Negative results do not  preclude SARS-CoV-2 infection, do not rule out co-infections with other pathogens, and should not be used as the sole basis for treatment or other patient management decisions. Negative results must be combined with clinical observations, patient history, and epidemiological information. The expected result is Negative. Fact Sheet for Patients: SugarRoll.be Fact Sheet for Healthcare Providers: https://www.woods-mathews.com/ This test is not yet approved or cleared by the [place] FDA and  has been authorized for detection and/or diagnosis of SARS-CoV-2 by FDA under an Emergency Use Authorization (EUA). This EUA will remain  in effect (meaning this test can be used) for the duration of the COVID-19 declaration under Section 56 4(b)(1) of the Act, 21 U.S.C. section 360bbb-3(b)(1), unless the authorization is terminated or revoked sooner. Performed at [facility], [place] [address], [place], [place] [address]   Culture, blood (routine x 2)     Status: None   Collection Time: 08/11/19 10:36 AM   Specimen: BLOOD RIGHT HAND  Result Value Ref Range Status   Specimen Description BLOOD RIGHT HAND  Final   Special Requests   Final    BOTTLES DRAWN AEROBIC ONLY Blood Culture adequate volume  Culture   Final    NO GROWTH 5 DAYS Performed at [facility], [place] [address] [place], [place] [address]    Report Status 08/16/2019 FINAL  Final  Culture, blood (routine x 2)     Status: None   Collection Time: 08/11/19 10:39 AM   Specimen: BLOOD RIGHT HAND  Result Value Ref Range Status   Specimen Description BLOOD RIGHT HAND  Final   Special Requests   Final    BOTTLES DRAWN AEROBIC ONLY Blood Culture results may not be optimal due to an inadequate volume of blood received in culture bottles   Culture   Final    NO GROWTH 5 DAYS Performed at [facility], [place] [address], [place], [place] [address]    Report Status 08/16/2019 FINAL   Final  SARS CORONAVIRUS 2 (TAT 6-24 HRS) Nasopharyngeal Nasopharyngeal Swab     Status: None   Collection Time: 08/17/19  6:23 PM   Specimen: Nasopharyngeal Swab  Result Value Ref Range Status   SARS Coronavirus 2 NEGATIVE NEGATIVE Final    Comment: (NOTE) SARS-CoV-2 target nucleic acids are NOT DETECTED. The SARS-CoV-2 RNA is generally detectable in upper and lower respiratory specimens during the acute phase of infection. Negative results do not preclude SARS-CoV-2 infection, do not rule out co-infections with other pathogens, and should not be used as the sole basis for treatment or other patient management decisions. Negative results must be combined with clinical observations, patient history, and epidemiological information. The expected result is Negative. Fact Sheet for Patients: SugarRoll.be Fact Sheet for Healthcare Providers: https://www.woods-mathews.com/ This test is not yet approved or cleared by the [place] FDA and  has been authorized for detection and/or diagnosis of SARS-CoV-2 by FDA under an Emergency Use Authorization (EUA). This EUA will remain  in effect (meaning this test can be used) for the duration of the COVID-19 declaration under Section 56 4(b)(1) of the Act, 21 U.S.C. section 360bbb-3(b)(1), unless the authorization is terminated or revoked sooner. Performed at [facility], [place] [address], [place], [place] [address]          Radiology Studies: No results found.      Scheduled Meds: . atorvastatin  40 mg Oral Daily  . Chlorhexidine Gluconate Cloth  6 each Topical Q0600  . cinacalcet  60 mg Oral QPM  . clopidogrel  75 mg Oral Daily  . docusate sodium  100 mg Oral BID  . doxercalciferol  2.5 mcg Oral Q M,W,F-HD  . melatonin  3 mg Oral QHS  . midodrine  10 mg Oral TID  . multivitamin  1 tablet Oral QHS  . saccharomyces boulardii  250 mg Oral BID  . senna-docusate  1 tablet Oral Daily     Continuous Infusions: . vancomycin       LOS: 8 days    Time spent: over 30 min    Fayrene Helper, MD [facility]   To contact the attending provider between 7A-7P or the covering provider during after hours 7P-7A, please log into the web site [email or web address] and access using universal [place] password for that web site. If you do not have the password, please call the hospital operator.  08/18/2019, 4:18 PM

## 2019-08-18 NOTE — Progress Notes (Signed)
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 No c/o this AM . Some soft BPs, ASx . HD yesterday, not much UF  04/20 0701 - 04/21 0700 In: 270 [P.O.:120; IV Piggyback:150] Out: 404   Filed Weights   08/17/19 1631 08/18/19 0430 08/18/19 0500  Weight: 62.7 kg 61.7 kg 61.7 kg    Scheduled Meds: . atorvastatin  40 mg Oral Daily  . Chlorhexidine Gluconate Cloth  6 each Topical Q0600  . cinacalcet  60 mg Oral QPM  . clopidogrel  75 mg Oral Daily  . docusate sodium  100 mg Oral BID  . doxercalciferol  2.5 mcg Oral Q M,W,F-HD  . melatonin  3 mg Oral QHS  . midodrine  10 mg Oral TID  . multivitamin  1 tablet Oral QHS  . saccharomyces boulardii  250 mg Oral BID  . senna-docusate  1 tablet Oral Daily   Continuous Infusions: . vancomycin     PRN Meds:.acetaminophen **OR** acetaminophen, albuterol, HYDROmorphone (DILAUDID) injection, oxyCODONE  Current Labs: reviewed    Physical Exam:  Blood pressure 101/66, pulse 98, temperature 98.4 F (36.9 C), temperature source Oral, resp. rate (!) 21, height 5\' 5"  (1.651 m), weight 61.7 kg, SpO2 98 %. chronicall ill apparing LLE AVG +B/T Regular nl s1s2 S/nt/nd L BKA site with wound vac CTAB  Dialysis prescription: Monday/Wednesday/Friday, 4 hours, BFR 400/DFR 800, 2K/2.5 calcium, heparin 6000 units, left femoral loop graft, EDW 66 kg.  [name] mcg 3 times weekly, Sensipar 90 mg daily.  A 1. ESRD MWF [facility] LUE AVG 2. 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 AMS likely from #2 and #4, improving  4. Enterococcus bacteremia; Vanc, RCID following; TEE 4/19 negative for IE; Vanc dosed with HD through 08/23/19 5. Leukocytosis, slowly improving 6. HTN 7. PVD 8. Hx/o CVA 9. 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 Aortic stenosis, severe  P . HD THS this week, then back on MWF next week:  2K, 3h, 400/800 LLE AVG no heparin, 1-2L UF . Medication Issues; o Preferred narcotic agents for pain control are hydromorphone, fentanyl, and methadone. Morphine should not be used.  o Baclofen should be avoided o Avoid oral sodium phosphate and magnesium citrate based laxatives / bowel preps    Pearson Grippe MD 08/18/2019, 11:10 AM  Recent Labs  Lab 08/13/19 0306 08/13/19 1020 08/14/19 0509  NA 136 135 137  K 3.9 4.4 4.0  CL 95* 96* 98  CO2 26 22 25   GLUCOSE 76 88 72  BUN 47* 51* 19  CREATININE 7.49* 7.91* 4.01*  CALCIUM 9.7 9.3 8.3*  PHOS  --  3.0 2.1*   Recent Labs  Lab 08/12/19 0518 08/12/19 0518 08/13/19 0306 08/13/19 1020 08/14/19 0509 08/14/19 0509 08/16/19 0231 08/17/19 0627 08/18/19 0240  WBC 40.6*   < > 39.0*   < > 31.3*   < > 32.7* 30.9* 27.6*  NEUTROABS 35.8*  --  34.2*  --  26.6*  --   --   --   --   HGB 12.1*   < > 11.3*   < > 11.0*   < > 10.9* 10.3* 11.0*  HCT 37.1*   < > 34.5*   < > 33.3*   < > 33.2* 31.3* 33.1*  MCV 61.9*   < > 61.5*   < >  61.3*   < > 62.1* 61.6* 62.0*  PLT 415*   < > 438*   < > 404*   < > 381 334 348   < > = values in this interval not displayed.

## 2019-08-18 NOTE — Progress Notes (Signed)
Physical Therapy Treatment Patient Details Name: Joseph TORAL Sr. MRN: [id number] DOB: [birth year] Today's Date: 08/18/2019    History of Present Illness Pt is a 79 y/o male here for BKA L LE on 08/13/19. PMH of R LE transmetatarsal amp, HTN, arthritis, CVA with residual L sided weakness, arthritis, anxiety, ESRD M/W/F, recent fall with R hip avulasion fx WBAT with no medical mgmt.  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    PT Comments    Pt was seen for mobility on LE's with application of L knee/stump protector.  Talked with pt about using device to avoid contracture of knee.  Pt is permitting PT to apply but is then asleep and unable to do anything with thera ex.  Follow acutely to progress transfers and sitting balance to increase safety and independence with his care in rehab.     Follow Up Recommendations  SNF     Equipment Recommendations  None recommended by PT    Recommendations for Other Services       Precautions / Restrictions Precautions Precautions: Fall Precaution Comments: old transtarsal amp RLE, new L BKA Required Braces or Orthoses: Other Brace Other Brace: residual limb protector, wound vac Restrictions Weight Bearing Restrictions: Yes RLE Weight Bearing: Weight bearing as tolerated LLE Weight Bearing: Non weight bearing    Mobility  Bed Mobility               General bed mobility comments: lethargic and could not sit him up in bed  Transfers                 General transfer comment: too lethargic  Ambulation/Gait                 Stairs             Wheelchair Mobility    Modified Rankin (Stroke Patients Only)       Balance                                            Cognition Arousal/Alertness: Lethargic Behavior During Therapy: Flat affect Overall Cognitive Status: No family/caregiver present to determine baseline cognitive functioning Area of Impairment:  Awareness;Safety/judgement;Following commands;Attention                   Current Attention Level: Selective Memory: Decreased recall of precautions;Decreased short-term memory Following Commands: Follows one step commands inconsistently Safety/Judgement: Decreased awareness of deficits Awareness: Intellectual Problem Solving: Slow processing;Requires verbal cues;Requires tactile cues;Decreased initiation;Difficulty sequencing General Comments: pt was asked to move by PT after agreeing and did not assist wiht any LE movement      Exercises      General Comments        Pertinent Vitals/Pain Pain Assessment: Faces Faces Pain Scale: No hurt    Home Living                      Prior Function            PT Goals (current goals can now be found in the care plan section) Acute Rehab PT Goals Patient Stated Goal: None reported Progress towards PT goals: Progressing toward goals    Frequency    Min 2X/week      PT Plan Current plan remains appropriate    Co-evaluation  AM-PAC PT "6 Clicks" Mobility   Outcome Measure  Help needed turning from your back to your side while in a flat bed without using bedrails?: A Lot Help needed moving from lying on your back to sitting on the side of a flat bed without using bedrails?: A Lot Help needed moving to and from a bed to a chair (including a wheelchair)?: A Lot Help needed standing up from a chair using your arms (e.g., wheelchair or bedside chair)?: Total Help needed to walk in hospital room?: Total Help needed climbing 3-5 steps with a railing? : Total 6 Click Score: 9    End of Session Equipment Utilized During Treatment: Other (comment)(post op positioning splint for L knee) Activity Tolerance: Patient limited by lethargy Patient left: in bed;with call bell/phone within reach;with bed alarm set Nurse Communication: Mobility status PT Visit Diagnosis: Other abnormalities of gait and  mobility (R26.89);Difficulty in walking, not elsewhere classified (R26.2);Muscle weakness (generalized) (M62.81) Pain - Right/Left: Left Pain - part of body: Leg     Time: 1400-1409 PT Time Calculation (min) (ACUTE ONLY): 9 min  Charges:  $Therapeutic Activity: 8-22 mins                 Ramond Dial 08/18/2019, 2:51 PM  Mee Hives, PT MS Acute Rehab Dept. Number: [place] and [place]

## 2019-08-18 NOTE — Progress Notes (Signed)
PT Cancellation Note  Patient Details Name: Joseph DIGIROLAMO Sr. MRN: [id number] DOB: 1941-01-06   Cancelled Treatment:    Reason Eval/Treat Not Completed: Medical issues which prohibited therapy.  Management of low BP currently and will reattempt at another time.   Ramond Dial 08/18/2019, 10:24 AM  Mee Hives, PT MS Acute Rehab Dept. Number: [place] and [place]

## 2019-08-18 NOTE — Progress Notes (Addendum)
The patient has been seen in conjunction with Cecilie Kicks, NP. All aspects of care have been considered and discussed. The patient has been personally interviewed, examined, and all clinical data has been reviewed.   Patient examined. He does have systolic murmur compatible with aortic stenosis.  Unable to have conversation concerning heart valve as the patient does not seem to comprehend.  We will sign off.  Treat bacteremia. If overall condition dramatically improves, an official recommendation concerning TAVR would be in order. Frankly, at this point I do not believe he would be a candidate. However, do not want to rule this out if he dramatically improves.   Progress Note  Patient Name: Joseph ELSAYED Sr. Date of Encounter: 08/18/2019  Primary Cardiologist: Rozann Lesches, MD   Subjective   No chest pain or SOB.  Resting in bed  + PVCs  Inpatient Medications    Scheduled Meds: . atorvastatin  40 mg Oral Daily  . Chlorhexidine Gluconate Cloth  6 each Topical Q0600  . cinacalcet  60 mg Oral QPM  . clopidogrel  75 mg Oral Daily  . docusate sodium  100 mg Oral BID  . doxercalciferol  2.5 mcg Oral Q M,W,F-HD  . melatonin  3 mg Oral QHS  . midodrine  10 mg Oral TID  . multivitamin  1 tablet Oral QHS  . saccharomyces boulardii  250 mg Oral BID  . senna-docusate  1 tablet Oral Daily   Continuous Infusions: . sodium chloride    . vancomycin     PRN Meds: acetaminophen **OR** acetaminophen, albuterol, HYDROmorphone (DILAUDID) injection, oxyCODONE   Vital Signs    Vitals:   08/18/19 0508 08/18/19 0509 08/18/19 0600 08/18/19 0730  BP: (!) 96/55  (!) 95/49 (!) 90/50  Pulse:   100 98  Resp:  19 15 20   Temp:      TempSrc:      SpO2:   98% 98%  Weight:      Height:        Intake/Output Summary (Last 24 hours) at 08/18/2019 0903 Last data filed at 08/18/2019 0500 Gross per 24 hour  Intake 270 ml  Output 404 ml  Net -134 ml   Last 3 Weights 08/18/2019 08/18/2019  08/17/2019  Weight (lbs) 136 lb 0.4 oz 136 lb 0.4 oz 138 lb 3.7 oz  Weight (kg) 61.7 kg 61.7 kg 62.7 kg      Telemetry    SR with 3-4 beats of NSVT, + PVCs - Personally Reviewed  ECG    No new - Personally Reviewed  Physical Exam   GEN: No acute distress.   Neck: No JVD Cardiac: RRR, 3/6 systolic murmur, no rubs, or gallops.  Respiratory: Clear to auscultation bilaterally. GI: Soft, nontender, non-distended  MS: No edema; Lt BKA  Neuro:  Nonfocal  Psych: Normal affect   Labs    High Sensitivity Troponin:  No results for input(s): TROPONINIHS in the last 720 hours.    Chemistry Recent Labs  Lab 08/13/19 0306 08/13/19 1020 08/14/19 0509  NA 136 135 137  K 3.9 4.4 4.0  CL 95* 96* 98  CO2 26 22 25   GLUCOSE 76 88 72  BUN 47* 51* 19  CREATININE 7.49* 7.91* 4.01*  CALCIUM 9.7 9.3 8.3*  ALBUMIN  --  1.8* 1.7*  GFRNONAA 6* 6* 14*  GFRAA 7* 7* 16*  ANIONGAP 15 17* 14     Hematology Recent Labs  Lab 08/16/19 0231 08/17/19 0627 08/18/19 0240  WBC  32.7* 30.9* 27.6*  RBC 5.35 5.08 5.34  HGB 10.9* 10.3* 11.0*  HCT 33.2* 31.3* 33.1*  MCV 62.1* 61.6* 62.0*  MCH 20.4* 20.3* 20.6*  MCHC 32.8 32.9 33.2  RDW 18.9* 18.8* 19.1*  PLT 381 334 348    BNPNo results for input(s): BNP, PROBNP in the last 168 hours.   DDimer No results for input(s): DDIMER in the last 168 hours.   Radiology    ECHO TEE  Result Date: 08/17/2019    TRANSESOPHOGEAL ECHO REPORT   Patient Name:   Joseph GASSMANN Sr. Date of Exam: 08/16/2019 Medical Rec #:  [id number]       Height:       65.0 in Accession #:    [id number]      Weight:       138.0 lb Date of Birth:  1940-09-04       BSA:          1.690 m Patient Age:    79 years        BP:           91/34 mmHg Patient Gender: M               HR:           87 bpm. Exam Location:  Inpatient Procedure: Transesophageal Echo, Cardiac Doppler, Color Doppler and 3D Echo Indications:     Aortic stenosis  History:         Patient has prior history of  Echocardiogram examinations, most                  recent 08/12/2019. CHF, Stroke, Aortic Valve Disease and Aortic                  stenosis; Signs/Symptoms:Bacteremia.  Sonographer:     Dustin Flock Referring Phys:  [name] MD Diagnosing Phys: Cherlynn Kaiser MD PROCEDURE: After discussion of the risks and benefits of a TEE, an informed consent was obtained from a family member. TEE procedure time was 41 minutes. The transesophogeal probe was passed without difficulty through the esophogus of the patient. Imaged  were obtained with the patient in a left lateral decubitus position. Sedation performed by different physician. The patient was monitored while under deep sedation. Anesthestetic sedation was provided intravenously by Anesthesiology: 263.39mg  of Propofol. Image quality was good. The patient's vital signs; including heart rate, blood pressure, and oxygen saturation; remained stable throughout the procedure. The patient developed no complications during the procedure. IMPRESSIONS  1. Left ventricular ejection fraction, by estimation, is 55 to 60%. The left ventricle has normal function. Left ventricular endocardial border not optimally defined to evaluate regional wall motion. There is mild left ventricular hypertrophy.  2. Right ventricular systolic function is normal. The right ventricular size is normal.  3. Left atrial size was mildly dilated. No left atrial/left atrial appendage thrombus was detected. The LAA emptying velocity was 69 cm/s.  4. The mitral valve is degenerative. Mild mitral valve regurgitation.  5. The tricuspid valve is degenerative. Tricuspid valve regurgitation is mild to moderate.  6. The aortic valve is severely calcified and restricted. Aortic valve regurgitation is moderate. Severe aortic valve stenosis. Aortic valve area, by VTI measures 0.69 cm. Aortic valve mean gradient measures 42.0 mmHg.  7. There is Severe (Grade IV) atheroma plaque involving the transverse and  descending aorta.  8. Possible tiny PFO with scant color flow seen between atrial septal flap, with redundant atrial septum. Agitated saline contrast  bubble study was negative, with no evidence of any interatrial shunt. Conclusion(s)/Recommendation(s): No evidence of vegetation/infective endocarditis on this transesophageal echocardiogram. FINDINGS  Left Ventricle: Left ventricular ejection fraction, by estimation, is 55 to 60%. The left ventricle has normal function. Left ventricular endocardial border not optimally defined to evaluate regional wall motion. The left ventricular internal cavity size was normal in size. There is mild left ventricular hypertrophy. Right Ventricle: The right ventricular size is normal. No increase in right ventricular wall thickness. Right ventricular systolic function is normal. Left Atrium: Left atrial size was mildly dilated. No left atrial/left atrial appendage thrombus was detected. The LAA emptying velocity was 69 cm/s. Right Atrium: Right atrial size was normal in size. Pericardium: There is no evidence of pericardial effusion. Mitral Valve: The mitral valve is degenerative in appearance. There is moderate calcification of the mitral valve leaflet(s). Mild mitral valve regurgitation. Tricuspid Valve: The tricuspid valve is degenerative in appearance. Tricuspid valve regurgitation is mild to moderate. Aortic Valve: Vena contracta 0.33 cm. The aortic valve is abnormal. Aortic valve regurgitation is moderate. Aortic regurgitation PHT measures 456 msec. Severe aortic stenosis is present. Moderate aortic valve annular calcification. There is severe calcifcation of the aortic valve. Aortic valve mean gradient measures 42.0 mmHg. Aortic valve peak gradient measures 81.0 mmHg. Aortic valve area, by VTI measures 0.69 cm. Pulmonic Valve: The pulmonic valve was normal in structure. Pulmonic valve regurgitation is trivial. Aorta: The aortic root and ascending aorta are structurally normal,  with no evidence of dilitation. There is severe (Grade IV) atheroma plaque involving the transverse and descending aorta. IAS/Shunts: Possible tiny PFO with scant color flow seen between atrial septal flap, with redundant atrial septum. Agitated saline contrast was given intravenously to evaluate for intracardiac shunting. Agitated saline contrast bubble study was negative,  with no evidence of any interatrial shunt.  LEFT VENTRICLE PLAX 2D LVOT diam:     2.10 cm LV SV:         65 LV SV Index:   39 LVOT Area:     3.46 cm  AORTIC VALVE AV Area (Vmax):    0.73 cm AV Area (Vmean):   0.67 cm AV Area (VTI):     0.69 cm AV Vmax:           450.00 cm/s AV Vmean:          301.000 cm/s AV VTI:            0.937 m AV Peak Grad:      81.0 mmHg AV Mean Grad:      42.0 mmHg LVOT Vmax:         94.30 cm/s LVOT Vmean:        58.100 cm/s LVOT VTI:          0.188 m LVOT/AV VTI ratio: 0.20 AI PHT:            456 msec  SHUNTS Systemic VTI:  0.19 m Systemic Diam: 2.10 cm Cherlynn Kaiser MD Electronically signed by Cherlynn Kaiser MD Signature Date/Time: 08/17/2019/12:27:16 PM    Final     Cardiac Studies   TEE 08/16/19 FINDINGS: Severe aortic valve stenosis, moderate aortic valve regurgitation.  Mild MR. Mild-moderate TR. 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 No valvular vegetations noted, though dense calcification on mitral and aortic valve limit assessment slightly from shadowing.    TTE 08/12/19 1. Since the last study on on 12/08/2018 aortic stenosis is now severe.  2. Left ventricular  ejection fraction, by estimation, is 60 to 65%. The  left ventricle has normal function. The left ventricle has no regional  wall motion abnormalities. There is mild concentric left ventricular  hypertrophy. Left ventricular diastolic  parameters are consistent with Grade I diastolic dysfunction (impaired  relaxation).  3. Right ventricular systolic function is normal. The right  ventricular  size is normal.  4. The mitral valve is normal in structure. Mild mitral valve  regurgitation. No evidence of mitral stenosis.  5. The aortic valve is normal in structure. Aortic valve regurgitation is  not visualized. Severe aortic valve stenosis. Aortic valve mean gradient  measures 47.0 mmHg.  6. The inferior vena cava is normal in size with greater than 50%  respiratory variability, suggesting right atrial pressure of 3 mmHg.   Conclusion(s)/Recommendation(s): No evidence of valvular vegetations on  this transthoracic echocardiogram. Would recommend a transesophageal  echocardiogram to exclude infective endocarditis if clinically indicated.    Patient Profile     79 y.o. 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   Assessment & Plan    1. Severe Aortic stenosis - Echo with Severe aortic valve stenosis. Aortic valve mean gradient  measures 47.0 mmHg. Aortic valve peak gradient measures 62.7  mmHg. Aortic valve area, by VTI measures 0.63 cm.  - TEE with severe AS, no vegetations.  - Seems not a candidate for AVR or even TVAR currently  Needs to recover from bacteremia.   2. Bacteremia - No vegetations by TEE - per ID  3. ESRD on HD 4. AMS 5. 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  AV node conduction abnormality -- may need 2-4 week monitor as outpt.  None currently  7. NSVT will check lytes and Mg+   8.  Hypotension this AM rec'd fluid bolus ProAmatine ordered       For questions or updates, please contact [place] Please consult [email or web address] for contact info under        Signed, Cecilie Kicks, NP  08/18/2019, 9:03 AM

## 2019-08-18 NOTE — Progress Notes (Signed)
BP 78/59. MD notified. 595mL bolus ordered. Will continue to monitor.  Paulene Floor, RN

## 2019-08-19 LAB — COMPREHENSIVE METABOLIC PANEL
ALT: 5 U/L (ref 0–44)
AST: 88 U/L — ABNORMAL HIGH (ref 15–41)
Albumin: 1.7 g/dL — ABNORMAL LOW (ref 3.5–5.0)
Alkaline Phosphatase: 65 U/L (ref 38–126)
Anion gap: 15 (ref 5–15)
BUN: 36 mg/dL — ABNORMAL HIGH (ref 8–23)
CO2: 22 mmol/L (ref 22–32)
Calcium: 8.4 mg/dL — ABNORMAL LOW (ref 8.9–10.3)
Chloride: 98 mmol/L (ref 98–111)
Creatinine, Ser: 6.14 mg/dL — ABNORMAL HIGH (ref 0.61–1.24)
GFR calc Af Amer: 9 mL/min — ABNORMAL LOW (ref >60)
GFR calc non Af Amer: 8 mL/min — ABNORMAL LOW (ref >60)
Glucose, Bld: 84 mg/dL (ref 70–99)
Potassium: 5 mmol/L (ref 3.5–5.1)
Sodium: 135 mmol/L (ref 135–145)
Total Bilirubin: 0.8 mg/dL (ref 0.3–1.2)
Total Protein: 7.1 g/dL (ref 6.5–8.1)

## 2019-08-19 LAB — CBC WITH DIFFERENTIAL/PLATELET
Abs Immature Granulocytes: 0.97 10*3/uL — ABNORMAL HIGH (ref 0.00–0.07)
Basophils Absolute: 0.1 10*3/uL (ref 0.0–0.1)
Basophils Relative: 0 %
Eosinophils Absolute: 0.3 10*3/uL (ref 0.0–0.5)
Eosinophils Relative: 1 %
HCT: 36.3 % — ABNORMAL LOW (ref 39.0–52.0)
Hemoglobin: 11.9 g/dL — ABNORMAL LOW (ref 13.0–17.0)
Immature Granulocytes: 3 %
Lymphocytes Relative: 7 %
Lymphs Abs: 2.4 10*3/uL (ref 0.7–4.0)
MCH: 20.2 pg — ABNORMAL LOW (ref 26.0–34.0)
MCHC: 32.8 g/dL (ref 30.0–36.0)
MCV: 61.7 fL — ABNORMAL LOW (ref 80.0–100.0)
Monocytes Absolute: 2.2 10*3/uL — ABNORMAL HIGH (ref 0.1–1.0)
Monocytes Relative: 6 %
Neutro Abs: 29.5 10*3/uL — ABNORMAL HIGH (ref 1.7–7.7)
Neutrophils Relative %: 83 %
Platelets: 398 10*3/uL (ref 150–400)
RBC: 5.88 MIL/uL — ABNORMAL HIGH (ref 4.22–5.81)
RDW: 19.8 % — ABNORMAL HIGH (ref 11.5–15.5)
WBC: 35.5 10*3/uL — ABNORMAL HIGH (ref 4.0–10.5)
nRBC: 0 % (ref 0.0–0.2)

## 2019-08-19 LAB — CORTISOL: Cortisol, Plasma: 19.9 ug/dL

## 2019-08-19 LAB — MAGNESIUM: Magnesium: 1.8 mg/dL (ref 1.7–2.4)

## 2019-08-19 LAB — PHOSPHORUS: Phosphorus: 3.7 mg/dL (ref 2.5–4.6)

## 2019-08-19 MED ORDER — VANCOMYCIN HCL IN DEXTROSE 750-5 MG/150ML-% IV SOLN
750.0000 mg | INTRAVENOUS | Status: DC
  Filled 2019-08-19: qty 150

## 2019-08-19 MED ORDER — VANCOMYCIN HCL IN DEXTROSE 750-5 MG/150ML-% IV SOLN
INTRAVENOUS | Status: AC
  Administered 2019-08-19: 750 mg via INTRAVENOUS
  Filled 2019-08-19: qty 150

## 2019-08-19 MED ORDER — ACETAMINOPHEN 325 MG PO TABS
ORAL_TABLET | ORAL | Status: AC
  Filled 2019-08-19: qty 2

## 2019-08-19 NOTE — Progress Notes (Signed)
Admit: 08/09/2019 LOS: 9  61M ESRD MWF, admit with AMS and nonhealing L TMA; L BKA 4/16  Subjective:  . No c/o this AM  04/21 0701 - 04/22 0700 In: 840 [P.O.:340; IV Piggyback:500] Out: 0   Filed Weights   08/18/19 0430 08/18/19 0500 08/19/19 0419  Weight: 61.7 kg 61.7 kg 63.3 kg    Scheduled Meds: . atorvastatin  40 mg Oral Daily  . Chlorhexidine Gluconate Cloth  6 each Topical Q0600  . cinacalcet  60 mg Oral QPM  . clopidogrel  75 mg Oral Daily  . docusate sodium  100 mg Oral BID  . doxercalciferol  2.5 mcg Oral Q M,W,F-HD  . melatonin  3 mg Oral QHS  . midodrine  10 mg Oral TID  . multivitamin  1 tablet Oral QHS  . saccharomyces boulardii  250 mg Oral BID  . senna-docusate  1 tablet Oral Daily   Continuous Infusions: . vancomycin     PRN Meds:.acetaminophen **OR** acetaminophen, albuterol, HYDROmorphone (DILAUDID) injection, oxyCODONE  Current Labs: reviewed    Physical Exam:  Blood pressure (!) 100/59, pulse 62, temperature 98 F (36.7 C), temperature source Oral, resp. rate 18, height 5\' 5"  (1.651 m), weight 63.3 kg, SpO2 92 %. chronicall ill apparing LLE AVG +B/T Regular nl s1s2 S/nt/nd L BKA site with wound vac CTAB  Dialysis prescription: Monday/Wednesday/Friday, 4 hours, BFR 400/DFR 800, 2K/2.5 calcium, heparin 6000 units, left femoral loop graft, EDW 66 kg.  [name] mcg 3 times weekly, Sensipar 90 mg daily.  A 1. ESRD MWF [facility] LLE AVG 2. 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 AMS likely from #2 and #4, improving  4. Enterococcus bacteremia; Vanc, [facility] following; TEE 4/19 negative for IE; Vanc dosed with HD through 08/23/19 5. Leukocytosis, slowly improving 6. HTN 7. PVD 8. Hx/o CVA  9. 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 Aortic stenosis, severe 13. Debility to SNF at DC  P . HD [facility] this week, then back on MWF next week: 2K, 3h, 400/800 LLE AVG no  heparin, 1-2L UF . If DC today, he should go to HD tomorrow on schedule . Medication Issues; o Preferred narcotic agents for pain control are hydromorphone, fentanyl, and methadone. Morphine should not be used.  o Baclofen should be avoided o Avoid oral sodium phosphate and magnesium citrate based laxatives / bowel preps    Pearson Grippe MD 08/19/2019, 11:08 AM  Recent Labs  Lab 08/13/19 1020 08/13/19 1020 08/14/19 0509 08/18/19 1025 08/19/19 0432  NA 135   < > 137 137 135  K 4.4   < > 4.0 4.4 5.0  CL 96*   < > 98 101 98  CO2 22   < > 25 22 22   GLUCOSE 88   < > 72 96 84  BUN 51*   < > 19 25* 36*  CREATININE 7.91*   < > 4.01* 5.10* 6.14*  CALCIUM 9.3   < > 8.3* 8.2* 8.4*  PHOS 3.0  --  2.1*  --  3.7   < > = values in this interval not displayed.   Recent Labs  Lab 08/13/19 0306 08/13/19 1020 08/14/19 0509 08/16/19 0231 08/17/19 0627 08/18/19 0240 08/19/19 0432  WBC 39.0*   < > 31.3*   < > 30.9* 27.6* 35.5*  NEUTROABS 34.2*  --  26.6*  --   --   --  29.5*  HGB 11.3*   < > 11.0*   < > 10.3* 11.0* 11.9*  HCT 34.5*   < > 33.3*   < > 31.3* 33.1* 36.3*  MCV 61.5*   < > 61.3*   < > 61.6* 62.0* 61.7*  PLT 438*   < > 404*   < > 334 348 398   < > = values in this interval not displayed.

## 2019-08-19 NOTE — Progress Notes (Signed)
PROGRESS NOTE    [name]  JFH:[id number] DOB: [birth year] DOA: 08/09/2019 PCP: [name], MD   Chief Complaint  Patient presents with  . Post-op Problem    Brief Narrative:  79 year old male with history of ESRD on HD MWF, diastolic CHF, moderate AS, PVD, HTN, IDA and bilateral transmetatarsal amputation brought to ED due to altered mental status during dialysis.  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  Admitted for sepsis in the setting of postoperative left foot infection and found to have Enterococcus bacteremia.  ID, orthopedic surgery (Dr. Sharol Given) and nephrology following. Patient underwent left transtibial amputation by Dr. Sharol Given on 4/16. Repeat blood culture negative.  TTE without vegetation but severe aortic stenosis.  Cardiology consulted for TEE and evaluation of aortic stenosis.    Patient underwent TEE on 08/16/2019 that showed aortic valve stenosis with moderate AVR, mild MR, mild to moderate TR, severe atherosclerotic plaque in descending aorta and aortic arch but no valvular vegetation.   ID recommended vancomycin after each dialysis through April 27 (last dose after HD on Monday 4/26).  Patient to be discharged to SNF with wound VAC and follow-up with orthopedic surgery in [name] week.  Assessment & Plan:   Principal Problem:   Bacteremia due to Enterococcus Active Problems:   PVD (peripheral vascular disease) (HCC)   Gangrene of left foot (HCC)   Postoperative wound infection   Prolonged QT interval   Acute metabolic encephalopathy   Thrombocytosis (HCC)   Hyperkalemia   Left foot infection  Sepsis due to Enterococcus bacteremia  Postoperative left foot wound infection -patient had left transmetatarsal amputation on 3/24. -CRP and ESR mildly elevated. -Underwent left transtibial amputation with wound VAC placement on 4/16 by Dr. Sharol Given. -Repeat blood culture on 4/14 NGTD x 5 -TTE without vegetation but  severe aortic stenosis -TEE without vegetation but limited assessment by [name] shadow from dense calcification on MV and AV. -ID recommended vancomycin after each dialysis with last dose on 4/26. -Ortho recommended outpatient follow-up in 1 week -Trend leukocytosis- worsened today, continue to monitor with treatment - no fevers   Hypotension Seems worse today than it has been, was receiving midodrine on dialysis days.  SBP in the 70's today, follow with IVF, increase midodrine to TID Continue to monitor, he's [name]&O without complaint of lightheadedness, dizziness, CP, SOB, etc + orthostatics -> repeat small bolus - improved orthostatics today, TSH wnl, follow am cortisol - wnl  Acute metabolic encephalopathy: Likely due to the above.  CT head without acute finding.  No focal neuro deficit.  Seems to have resolved.  He is fairly oriented. -Manage sepsis/bacteremia as above. -Frequent reorientation and delirium precautions.  ESRD on HD M/W/Fat the [facility] Bone marrow disorder Azotemia/hyperkalemia/hyponatremia/Anion gap metabolic acidosis-resolved. Anemia of renal disease-stable. Nephrology c/s, appreciate recs  Severe aortic stenosis/chronic diastolic CHF: No cardiopulmonary symptoms.  Appears euvolemic. -Cardiology consulted-recommended outpatient follow-up after bacteremia is treated.  TEE on 4/19 for bacteremia. -Fluid management by dialysis -Strict intake and output  Frequent PVCs: continue to monitor, EF 60-65%, BP precludes beta blocker at this time  Peripheral vascular disease:  -Resume Plavix  History of nonhemorrhagic CVA in 2000: No apparent neuro deficit on exam. -Continue home medications.   Positive Hemoccult  Blood Streaked Stool-H&H stable.  - Hb stable -> ?hemorrhoids - will need GI follow up outpatient (inpatient if worsening or unstable)  Hyperlipidemia:  -Continue home atorvastatin  Bandemia/thrombocytosis: Likely reactive from #1.  Improving. -Continue monitoring  Debility -PT/OT recommended SNF.  Pressure injury Pressure Injury 08/15/19 Sacrum (Active)  08/15/19 0300  Location: Sacrum  Location Orientation:   Staging:   Wound Description (Comments):   Present on Admission:    DVT prophylaxis: SCD Code Status: full Family Communication: none at bedside Disposition:   Status is: Inpatient  Remains inpatient appropriate because:Inpatient level of care appropriate due to severity of illness, hypotension requiring IVF and adjustment in midodrine dosing   Dispo: The patient is from: [facility]              Anticipated d/c is to: SNF              Anticipated d/c date is: 1 day              Patient currently is not medically stable to d/c.   Consultants:   Renal  Cardiology  ID  Procedures:  4/16-left transtibial amputation with wound VAC placement  Echo 4/15 IMPRESSIONS    1. Since the last study on on 12/08/2018 aortic stenosis is now severe.  2. Left ventricular ejection fraction, by estimation, is 60 to 65%. The  left ventricle has normal function. The left ventricle has no regional  wall motion abnormalities. There is mild concentric left ventricular  hypertrophy. Left ventricular diastolic  parameters are consistent with Grade I diastolic dysfunction (impaired  relaxation).  3. Right ventricular systolic function is normal. The right ventricular  size is normal.  4. The mitral valve is normal in structure. Mild mitral valve  regurgitation. No evidence of mitral stenosis.  5. The aortic valve is normal in structure. Aortic valve regurgitation is  not visualized. Severe aortic valve stenosis. Aortic valve mean gradient  measures 47.0 mmHg.  6. The inferior vena cava is normal in size with greater than 50%  respiratory variability, suggesting right atrial pressure of 3 mmHg.   TEE IMPRESSIONS    1. Left ventricular ejection fraction, by estimation, is 55 to 60%. The    left ventricle has normal function. Left ventricular endocardial border  not optimally defined to evaluate regional wall motion. There is mild left  ventricular hypertrophy.  2. Right ventricular systolic function is normal. The right ventricular  size is normal.  3. Left atrial size was mildly dilated. No left atrial/left atrial  appendage thrombus was detected. The LAA emptying velocity was 69 cm/s.  4. The mitral valve is degenerative. Mild mitral valve regurgitation.  5. The tricuspid valve is degenerative. Tricuspid valve regurgitation is  mild to moderate.  6. The aortic valve is severely calcified and restricted. Aortic valve  regurgitation is moderate. Severe aortic valve stenosis. Aortic valve  area, by VTI measures 0.69 cm. Aortic valve mean gradient measures 42.0  mmHg.  7. There is Severe (Grade IV) atheroma plaque involving the transverse  and descending aorta.  8. Possible tiny PFO with scant color flow seen between atrial septal  flap, with redundant atrial septum. Agitated saline contrast bubble study  was negative, with no evidence of any interatrial shunt.   Conclusion(s)/Recommendation(s): No evidence of vegetation/infective  endocarditis on this transesophageal  echocardiogram.   Antimicrobials:  Anti-infectives (From admission, onward)   Start     Dose/Rate Route Frequency Ordered Stop   08/19/19 1200  vancomycin (VANCOCIN) IVPB 750 mg/150 ml premix     750 mg 150 mL/hr over 60 Minutes Intravenous Every T-Th-Sa (Hemodialysis) 08/19/19 0846 08/26/19 1159   08/19/19 0844  vancomycin (VANCOCIN) IVPB 750 mg/150 ml  premix  Status:  Discontinued     750 mg 150 mL/hr over 60 Minutes Intravenous Every T-Th-Sa (Hemodialysis) 08/19/19 0845 08/19/19 0846   08/17/19 1445  Vancomycin (VANCOCIN) 750-5 MG/150ML-% IVPB    Note to Pharmacy: Almira Bar   : cabinet override      08/17/19 1445 08/17/19 1648   08/17/19 1200  vancomycin (VANCOCIN) IVPB 750 mg/150 ml  premix  Status:  Discontinued     750 mg 150 mL/hr over 60 Minutes Intravenous Every M-W-F (Hemodialysis) 08/17/19 0752 08/19/19 0845   08/13/19 2200  ceFAZolin (ANCEF) IVPB 1 g/50 mL premix     1 g 100 mL/hr over 30 Minutes Intravenous  Once 08/13/19 0947 08/14/19 0321   08/13/19 0738  ceFAZolin (ANCEF) 2-4 GM/100ML-% IVPB    Note to Pharmacy: Clearnce Sorrel   : cabinet override      08/13/19 0738 08/13/19 1944   08/11/19 1200  vancomycin (VANCOCIN) IVPB 750 mg/150 ml premix  Status:  Discontinued     750 mg 150 mL/hr over 60 Minutes Intravenous Every M-W-F (Hemodialysis) 08/11/19 0903 08/17/19 0752   08/11/19 1000  ampicillin (OMNIPEN) 2 g in sodium chloride 0.9 % 100 mL IVPB  Status:  Discontinued     2 g 300 mL/hr over 20 Minutes Intravenous Every 12 hours 08/11/19 0405 08/11/19 0857   08/11/19 0915  vancomycin (VANCOREADY) IVPB 1250 mg/250 mL  Status:  Discontinued     1,250 mg 166.7 mL/hr over 90 Minutes Intravenous  Once 08/11/19 0903 08/11/19 0907   08/11/19 0915  ceFAZolin (ANCEF) IVPB 2g/100 mL premix     2 g 200 mL/hr over 30 Minutes Intravenous On call to O.R. 08/11/19 0909 08/12/19 0559   08/11/19 0000  vancomycin variable dose per unstable renal function (pharmacist dosing)  Status:  Discontinued      Does not apply See admin instructions 08/10/19 1108 08/11/19 0405   08/10/19 1600  piperacillin-tazobactam (ZOSYN) IVPB 2.25 g  Status:  Discontinued     2.25 g 100 mL/hr over 30 Minutes Intravenous Every 8 hours 08/10/19 1108 08/11/19 0405   08/10/19 1115  vancomycin (VANCOREADY) IVPB 500 mg/100 mL     500 mg 100 mL/hr over 60 Minutes Intravenous NOW 08/10/19 1105 08/10/19 1435   08/10/19 0745  piperacillin-tazobactam (ZOSYN) IVPB 3.375 g     3.375 g 100 mL/hr over 30 Minutes Intravenous  Once 08/10/19 0732 08/10/19 0935   08/10/19 0745  vancomycin (VANCOCIN) IVPB 1000 mg/200 mL premix     1,000 mg 200 mL/hr over 60 Minutes Intravenous  Once 08/10/19 0732 08/10/19 0959       Subjective: No new complaints  Objective: Vitals:   08/19/19 1500 08/19/19 1530 08/19/19 1533 08/19/19 1633  BP: (!) 86/44 (!) 92/48 (!) 107/44 (!) 87/54  Pulse: 87 94 94 86  Resp:   19 18  Temp:   97.8 F (36.6 C) 97.9 F (36.6 C)  TempSrc:   Oral Oral  SpO2:   97% 94%  Weight:   63.9 kg   Height:        Intake/Output Summary (Last 24 hours) at 08/19/2019 1812 Last data filed at 08/19/2019 1533 Gross per 24 hour  Intake 440 ml  Output -70 ml  Net 510 ml   Filed Weights   08/19/19 0419 08/19/19 1230 08/19/19 1533  Weight: 63.3 kg 63.3 kg 63.9 kg    Examination:  General: No acute distress. Cardiovascular: Heart sounds show Letha Mirabal regular rate, and rhythm. Lungs: Clear  to auscultation bilaterally  Abdomen: Soft, nontender, nondistended Neurological: Alert and oriented 3. Moves all extremities 4. Cranial nerves II through XII grossly intact. Skin: Warm and dry. No rashes or lesions. Extremities: No clubbing or cyanosis. No edema  Data Reviewed: I have personally reviewed following labs and imaging studies  CBC: Recent Labs  Lab 08/13/19 0306 08/13/19 1020 08/14/19 0509 08/16/19 0231 08/17/19 0627 08/18/19 0240 08/19/19 0432  WBC 39.0*   < > 31.3* 32.7* 30.9* 27.6* 35.5*  NEUTROABS 34.2*  --  26.6*  --   --   --  29.5*  HGB 11.3*   < > 11.0* 10.9* 10.3* 11.0* 11.9*  HCT 34.5*   < > 33.3* 33.2* 31.3* 33.1* 36.3*  MCV 61.5*   < > 61.3* 62.1* 61.6* 62.0* 61.7*  PLT 438*   < > 404* 381 334 348 398   < > = values in this interval not displayed.    Basic Metabolic Panel: Recent Labs  Lab 08/13/19 0306 08/13/19 1020 08/14/19 0509 08/18/19 1025 08/19/19 0432  NA 136 135 137 137 135  K 3.9 4.4 4.0 4.4 5.0  CL 95* 96* 98 101 98  CO2 26 22 25 22 22   GLUCOSE 76 88 72 96 84  BUN 47* 51* 19 25* 36*  CREATININE 7.49* 7.91* 4.01* 5.10* 6.14*  CALCIUM 9.7 9.3 8.3* 8.2* 8.4*  MG 2.0  --  1.9 1.7 1.8  PHOS  --  3.0 2.1*  --  3.7    GFR: Estimated Creatinine  Clearance: 8.9 mL/min (Nadra Hritz) (by C-G formula based on SCr of 6.14 mg/dL (H)).  Liver Function Tests: Recent Labs  Lab 08/13/19 1020 08/14/19 0509 08/18/19 1025 08/19/19 0432  AST  --   --  82* 88*  ALT  --   --  <5 5  ALKPHOS  --   --  58 65  BILITOT  --   --  0.8 0.8  PROT  --   --  6.3* 7.1  ALBUMIN 1.8* 1.7* 1.5* 1.7*    CBG: Recent Labs  Lab 08/13/19 0833  GLUCAP 86     Recent Results (from the past 240 hour(s))  Blood culture (routine x 2)     Status: Abnormal   Collection Time: 08/10/19  4:30 AM   Specimen: BLOOD LEFT ARM  Result Value Ref Range Status   Specimen Description BLOOD LEFT ARM  Final   Special Requests   Final    BOTTLES DRAWN AEROBIC AND ANAEROBIC Blood Culture adequate volume   Culture  Setup Time   Final    IN BOTH AEROBIC AND ANAEROBIC BOTTLES GRAM POSITIVE COCCI CRITICAL RESULT CALLED TO, READ BACK BY AND VERIFIED WITH: G. ABBOTT,PHARMD 1610 08/11/2019 T. TYSOR    Culture (Audi Conover)  Final    ENTEROCOCCUS FAECALIS SUSCEPTIBILITIES PERFORMED ON PREVIOUS CULTURE WITHIN THE LAST 5 DAYS. Performed at [facility], [place] [address] [place], [place] [address]    Report Status 08/13/2019 FINAL  Final  Blood Culture ID Panel (Reflexed)     Status: Abnormal   Collection Time: 08/10/19  4:30 AM  Result Value Ref Range Status   Enterococcus species DETECTED (Millissa Deese) NOT DETECTED Final    Comment: CRITICAL RESULT CALLED TO, READ BACK BY AND VERIFIED WITH: G. ABBOTT,PHARMD 0248 08/11/2019 T. TYSOR    Vancomycin resistance NOT DETECTED NOT DETECTED Final   Listeria monocytogenes NOT DETECTED NOT DETECTED Final   Staphylococcus species NOT DETECTED NOT DETECTED Final   Staphylococcus aureus (  BCID) NOT DETECTED NOT DETECTED Final   Streptococcus species NOT DETECTED NOT DETECTED Final   Streptococcus agalactiae NOT DETECTED NOT DETECTED Final   Streptococcus pneumoniae NOT DETECTED NOT DETECTED Final   Streptococcus pyogenes NOT DETECTED NOT DETECTED Final    Acinetobacter baumannii NOT DETECTED NOT DETECTED Final   Enterobacteriaceae species NOT DETECTED NOT DETECTED Final   Enterobacter cloacae complex NOT DETECTED NOT DETECTED Final   Escherichia coli NOT DETECTED NOT DETECTED Final   Klebsiella oxytoca NOT DETECTED NOT DETECTED Final   Klebsiella pneumoniae NOT DETECTED NOT DETECTED Final   Proteus species NOT DETECTED NOT DETECTED Final   Serratia marcescens NOT DETECTED NOT DETECTED Final   Haemophilus influenzae NOT DETECTED NOT DETECTED Final   Neisseria meningitidis NOT DETECTED NOT DETECTED Final   Pseudomonas aeruginosa NOT DETECTED NOT DETECTED Final   Candida albicans NOT DETECTED NOT DETECTED Final   Candida glabrata NOT DETECTED NOT DETECTED Final   Candida krusei NOT DETECTED NOT DETECTED Final   Candida parapsilosis NOT DETECTED NOT DETECTED Final   Candida tropicalis NOT DETECTED NOT DETECTED Final    Comment: Performed at [facility], [place] [address] [place], [place] [address]  Blood culture (routine x 2)     Status: Abnormal   Collection Time: 08/10/19  4:55 AM   Specimen: BLOOD RIGHT ARM  Result Value Ref Range Status   Specimen Description BLOOD RIGHT ARM  Final   Special Requests   Final    BOTTLES DRAWN AEROBIC ONLY Blood Culture adequate volume   Culture  Setup Time   Final    AEROBIC BOTTLE ONLY GRAM POSITIVE COCCI CRITICAL VALUE NOTED.  VALUE IS CONSISTENT WITH PREVIOUSLY REPORTED AND CALLED VALUE. Performed at [facility], [place] [address], [place], [place] [address]    Culture ENTEROCOCCUS FAECALIS (Ora Bollig)  Final   Report Status 08/13/2019 FINAL  Final   Organism ID, Bacteria ENTEROCOCCUS FAECALIS  Final      Susceptibility   Enterococcus faecalis - MIC*    AMPICILLIN <=2 SENSITIVE Sensitive     VANCOMYCIN <=0.5 SENSITIVE Sensitive     GENTAMICIN SYNERGY SENSITIVE Sensitive     * ENTEROCOCCUS FAECALIS  SARS CORONAVIRUS 2 (TAT 6-24 HRS) Nasopharyngeal Nasopharyngeal Swab     Status: None    Collection Time: 08/10/19  9:59 AM   Specimen: Nasopharyngeal Swab  Result Value Ref Range Status   SARS Coronavirus 2 NEGATIVE NEGATIVE Final    Comment: (NOTE) SARS-CoV-2 target nucleic acids are NOT DETECTED. The SARS-CoV-2 RNA is generally detectable in upper and lower respiratory specimens during the acute phase of infection. Negative results do not preclude SARS-CoV-2 infection, do not rule out co-infections with other pathogens, and should not be used as the sole basis for treatment or other patient management decisions. Negative results must be combined with clinical observations, patient history, and epidemiological information. The expected result is Negative. Fact Sheet for Patients: SugarRoll.be Fact Sheet for Healthcare Providers: https://www.woods-mathews.com/ This test is not yet approved or cleared by the [place] FDA and  has been authorized for detection and/or diagnosis of SARS-CoV-2 by FDA under an Emergency Use Authorization (EUA). This EUA will remain  in effect (meaning this test can be used) for the duration of the COVID-19 declaration under Section 56 4(b)(1) of the Act, 21 U.S.C. section 360bbb-3(b)(1), unless the authorization is terminated or revoked sooner. Performed at [facility], [place] [address], [place], [place] [address]   Culture, blood (routine x  2)     Status: None   Collection Time: 08/11/19 10:36 AM   Specimen: BLOOD RIGHT HAND  Result Value Ref Range Status   Specimen Description BLOOD RIGHT HAND  Final   Special Requests   Final    BOTTLES DRAWN AEROBIC ONLY Blood Culture adequate volume   Culture   Final    NO GROWTH 5 DAYS Performed at [facility], [address] [place], [place] [address]    Report Status 08/16/2019 FINAL  Final  Culture, blood (routine x 2)     Status: None   Collection Time: 08/11/19 10:39 AM   Specimen: BLOOD RIGHT HAND  Result Value Ref Range Status    Specimen Description BLOOD RIGHT HAND  Final   Special Requests   Final    BOTTLES DRAWN AEROBIC ONLY Blood Culture results may not be optimal due to an inadequate volume of blood received in culture bottles   Culture   Final    NO GROWTH 5 DAYS Performed at [facility], [place] [address], [place], [place] [address]    Report Status 08/16/2019 FINAL  Final  SARS CORONAVIRUS 2 (TAT 6-24 HRS) Nasopharyngeal Nasopharyngeal Swab     Status: None   Collection Time: 08/17/19  6:23 PM   Specimen: Nasopharyngeal Swab  Result Value Ref Range Status   SARS Coronavirus 2 NEGATIVE NEGATIVE Final    Comment: (NOTE) SARS-CoV-2 target nucleic acids are NOT DETECTED. The SARS-CoV-2 RNA is generally detectable in upper and lower respiratory specimens during the acute phase of infection. Negative results do not preclude SARS-CoV-2 infection, do not rule out co-infections with other pathogens, and should not be used as the sole basis for treatment or other patient management decisions. Negative results must be combined with clinical observations, patient history, and epidemiological information. The expected result is Negative. Fact Sheet for Patients: SugarRoll.be Fact Sheet for Healthcare Providers: https://www.woods-mathews.com/ This test is not yet approved or cleared by the [place] FDA and  has been authorized for detection and/or diagnosis of SARS-CoV-2 by FDA under an Emergency Use Authorization (EUA). This EUA will remain  in effect (meaning this test can be used) for the duration of the COVID-19 declaration under Section 56 4(b)(1) of the Act, 21 U.S.C. section 360bbb-3(b)(1), unless the authorization is terminated or revoked sooner. Performed at [facility], [place] [address], [place], [place] [address]          Radiology Studies: No results found.      Scheduled Meds: . atorvastatin  40 mg Oral Daily  . Chlorhexidine  Gluconate Cloth  6 each Topical Q0600  . cinacalcet  60 mg Oral QPM  . clopidogrel  75 mg Oral Daily  . docusate sodium  100 mg Oral BID  . doxercalciferol  2.5 mcg Oral Q M,W,F-HD  . melatonin  3 mg Oral QHS  . midodrine  10 mg Oral TID  . multivitamin  1 tablet Oral QHS  . saccharomyces boulardii  250 mg Oral BID  . senna-docusate  1 tablet Oral Daily   Continuous Infusions: . vancomycin Stopped (08/19/19 1714)     LOS: 9 days    Time spent: over 30 min    Fayrene Helper, MD [facility]   To contact the attending provider between 7A-7P or the covering provider during after hours 7P-7A, please log into the web site [email or web address] and access using universal [place] password for that web site. If you do not have the  password, please call the hospital operator.  08/19/2019, 6:12 PM

## 2019-08-20 DIAGNOSIS — K228 Other specified diseases of esophagus: Secondary | ICD-10-CM | POA: Diagnosis not present

## 2019-08-20 DIAGNOSIS — I70262 Atherosclerosis of native arteries of extremities with gangrene, left leg: Secondary | ICD-10-CM | POA: Diagnosis not present

## 2019-08-20 DIAGNOSIS — I96 Gangrene, not elsewhere classified: Secondary | ICD-10-CM | POA: Diagnosis not present

## 2019-08-20 DIAGNOSIS — Y835 Amputation of limb(s) as the cause of abnormal reaction of the patient, or of later complication, without mention of misadventure at the time of the procedure: Secondary | ICD-10-CM | POA: Diagnosis present

## 2019-08-20 DIAGNOSIS — I350 Nonrheumatic aortic (valve) stenosis: Secondary | ICD-10-CM | POA: Diagnosis present

## 2019-08-20 DIAGNOSIS — S72144D Nondisplaced intertrochanteric fracture of right femur, subsequent encounter for closed fracture with routine healing: Secondary | ICD-10-CM | POA: Diagnosis not present

## 2019-08-20 DIAGNOSIS — Z20822 Contact with and (suspected) exposure to covid-19: Secondary | ICD-10-CM | POA: Diagnosis not present

## 2019-08-20 DIAGNOSIS — E1152 Type 2 diabetes mellitus with diabetic peripheral angiopathy with gangrene: Secondary | ICD-10-CM | POA: Diagnosis not present

## 2019-08-20 DIAGNOSIS — I10 Essential (primary) hypertension: Secondary | ICD-10-CM | POA: Diagnosis present

## 2019-08-20 DIAGNOSIS — I120 Hypertensive chronic kidney disease with stage 5 chronic kidney disease or end stage renal disease: Secondary | ICD-10-CM | POA: Diagnosis not present

## 2019-08-20 DIAGNOSIS — I428 Other cardiomyopathies: Secondary | ICD-10-CM | POA: Diagnosis not present

## 2019-08-20 DIAGNOSIS — Z681 Body mass index (BMI) 19 or less, adult: Secondary | ICD-10-CM | POA: Diagnosis not present

## 2019-08-20 DIAGNOSIS — I493 Ventricular premature depolarization: Secondary | ICD-10-CM | POA: Diagnosis not present

## 2019-08-20 DIAGNOSIS — I959 Hypotension, unspecified: Secondary | ICD-10-CM | POA: Diagnosis not present

## 2019-08-20 DIAGNOSIS — R2689 Other abnormalities of gait and mobility: Secondary | ICD-10-CM | POA: Diagnosis present

## 2019-08-20 DIAGNOSIS — Z89432 Acquired absence of left foot: Secondary | ICD-10-CM | POA: Diagnosis not present

## 2019-08-20 DIAGNOSIS — R404 Transient alteration of awareness: Secondary | ICD-10-CM | POA: Diagnosis not present

## 2019-08-20 DIAGNOSIS — Z992 Dependence on renal dialysis: Secondary | ICD-10-CM | POA: Diagnosis not present

## 2019-08-20 DIAGNOSIS — Z7902 Long term (current) use of antithrombotics/antiplatelets: Secondary | ICD-10-CM | POA: Diagnosis not present

## 2019-08-20 DIAGNOSIS — Z4781 Encounter for orthopedic aftercare following surgical amputation: Secondary | ICD-10-CM | POA: Diagnosis not present

## 2019-08-20 DIAGNOSIS — R5381 Other malaise: Secondary | ICD-10-CM | POA: Diagnosis not present

## 2019-08-20 DIAGNOSIS — R69 Illness, unspecified: Secondary | ICD-10-CM | POA: Diagnosis not present

## 2019-08-20 DIAGNOSIS — I739 Peripheral vascular disease, unspecified: Secondary | ICD-10-CM | POA: Diagnosis present

## 2019-08-20 DIAGNOSIS — I471 Supraventricular tachycardia: Secondary | ICD-10-CM | POA: Diagnosis not present

## 2019-08-20 DIAGNOSIS — K264 Chronic or unspecified duodenal ulcer with hemorrhage: Secondary | ICD-10-CM | POA: Diagnosis not present

## 2019-08-20 DIAGNOSIS — T8781 Dehiscence of amputation stump: Secondary | ICD-10-CM | POA: Diagnosis present

## 2019-08-20 DIAGNOSIS — B952 Enterococcus as the cause of diseases classified elsewhere: Secondary | ICD-10-CM | POA: Diagnosis not present

## 2019-08-20 DIAGNOSIS — I9589 Other hypotension: Secondary | ICD-10-CM | POA: Diagnosis present

## 2019-08-20 DIAGNOSIS — I69354 Hemiplegia and hemiparesis following cerebral infarction affecting left non-dominant side: Secondary | ICD-10-CM | POA: Diagnosis not present

## 2019-08-20 DIAGNOSIS — R279 Unspecified lack of coordination: Secondary | ICD-10-CM | POA: Diagnosis not present

## 2019-08-20 DIAGNOSIS — E43 Unspecified severe protein-calorie malnutrition: Secondary | ICD-10-CM | POA: Diagnosis not present

## 2019-08-20 DIAGNOSIS — D631 Anemia in chronic kidney disease: Secondary | ICD-10-CM | POA: Diagnosis present

## 2019-08-20 DIAGNOSIS — Z09 Encounter for follow-up examination after completed treatment for conditions other than malignant neoplasm: Secondary | ICD-10-CM | POA: Diagnosis not present

## 2019-08-20 DIAGNOSIS — I132 Hypertensive heart and chronic kidney disease with heart failure and with stage 5 chronic kidney disease, or end stage renal disease: Secondary | ICD-10-CM | POA: Diagnosis not present

## 2019-08-20 DIAGNOSIS — R7881 Bacteremia: Secondary | ICD-10-CM | POA: Diagnosis not present

## 2019-08-20 DIAGNOSIS — K31819 Angiodysplasia of stomach and duodenum without bleeding: Secondary | ICD-10-CM | POA: Diagnosis not present

## 2019-08-20 DIAGNOSIS — F419 Anxiety disorder, unspecified: Secondary | ICD-10-CM | POA: Diagnosis present

## 2019-08-20 DIAGNOSIS — R1311 Dysphagia, oral phase: Secondary | ICD-10-CM | POA: Diagnosis present

## 2019-08-20 DIAGNOSIS — I472 Ventricular tachycardia: Secondary | ICD-10-CM | POA: Diagnosis not present

## 2019-08-20 DIAGNOSIS — K922 Gastrointestinal hemorrhage, unspecified: Secondary | ICD-10-CM | POA: Diagnosis not present

## 2019-08-20 DIAGNOSIS — M6281 Muscle weakness (generalized): Secondary | ICD-10-CM | POA: Diagnosis present

## 2019-08-20 DIAGNOSIS — I6930 Unspecified sequelae of cerebral infarction: Secondary | ICD-10-CM | POA: Diagnosis not present

## 2019-08-20 DIAGNOSIS — D62 Acute posthemorrhagic anemia: Secondary | ICD-10-CM | POA: Diagnosis not present

## 2019-08-20 DIAGNOSIS — R9431 Abnormal electrocardiogram [ECG] [EKG]: Secondary | ICD-10-CM | POA: Diagnosis not present

## 2019-08-20 DIAGNOSIS — I2510 Atherosclerotic heart disease of native coronary artery without angina pectoris: Secondary | ICD-10-CM | POA: Diagnosis present

## 2019-08-20 DIAGNOSIS — Z743 Need for continuous supervision: Secondary | ICD-10-CM | POA: Diagnosis not present

## 2019-08-20 DIAGNOSIS — D509 Iron deficiency anemia, unspecified: Secondary | ICD-10-CM | POA: Diagnosis not present

## 2019-08-20 DIAGNOSIS — L89153 Pressure ulcer of sacral region, stage 3: Secondary | ICD-10-CM | POA: Diagnosis not present

## 2019-08-20 DIAGNOSIS — Z452 Encounter for adjustment and management of vascular access device: Secondary | ICD-10-CM | POA: Diagnosis not present

## 2019-08-20 DIAGNOSIS — I4510 Unspecified right bundle-branch block: Secondary | ICD-10-CM | POA: Diagnosis present

## 2019-08-20 DIAGNOSIS — Z7401 Bed confinement status: Secondary | ICD-10-CM | POA: Diagnosis not present

## 2019-08-20 DIAGNOSIS — K269 Duodenal ulcer, unspecified as acute or chronic, without hemorrhage or perforation: Secondary | ICD-10-CM | POA: Diagnosis not present

## 2019-08-20 DIAGNOSIS — N2581 Secondary hyperparathyroidism of renal origin: Secondary | ICD-10-CM | POA: Diagnosis present

## 2019-08-20 DIAGNOSIS — Z7189 Other specified counseling: Secondary | ICD-10-CM | POA: Diagnosis not present

## 2019-08-20 DIAGNOSIS — I5032 Chronic diastolic (congestive) heart failure: Secondary | ICD-10-CM | POA: Diagnosis not present

## 2019-08-20 DIAGNOSIS — R54 Age-related physical debility: Secondary | ICD-10-CM | POA: Diagnosis present

## 2019-08-20 DIAGNOSIS — R4182 Altered mental status, unspecified: Secondary | ICD-10-CM | POA: Diagnosis not present

## 2019-08-20 DIAGNOSIS — N186 End stage renal disease: Secondary | ICD-10-CM | POA: Diagnosis not present

## 2019-08-20 DIAGNOSIS — I429 Cardiomyopathy, unspecified: Secondary | ICD-10-CM | POA: Diagnosis present

## 2019-08-20 DIAGNOSIS — T8149XD Infection following a procedure, other surgical site, subsequent encounter: Secondary | ICD-10-CM | POA: Diagnosis present

## 2019-08-20 DIAGNOSIS — R5383 Other fatigue: Secondary | ICD-10-CM | POA: Diagnosis not present

## 2019-08-20 DIAGNOSIS — K284 Chronic or unspecified gastrojejunal ulcer with hemorrhage: Secondary | ICD-10-CM | POA: Diagnosis not present

## 2019-08-20 DIAGNOSIS — L089 Local infection of the skin and subcutaneous tissue, unspecified: Secondary | ICD-10-CM | POA: Diagnosis not present

## 2019-08-20 DIAGNOSIS — Z515 Encounter for palliative care: Secondary | ICD-10-CM | POA: Diagnosis not present

## 2019-08-20 DIAGNOSIS — Z01810 Encounter for preprocedural cardiovascular examination: Secondary | ICD-10-CM | POA: Diagnosis not present

## 2019-08-20 DIAGNOSIS — D72829 Elevated white blood cell count, unspecified: Secondary | ICD-10-CM | POA: Diagnosis not present

## 2019-08-20 DIAGNOSIS — E1122 Type 2 diabetes mellitus with diabetic chronic kidney disease: Secondary | ICD-10-CM | POA: Diagnosis present

## 2019-08-20 LAB — CBC WITH DIFFERENTIAL/PLATELET
Basophils Absolute: 0 10*3/uL (ref 0.0–0.1)
Basophils Relative: 0 %
Eosinophils Absolute: 0 10*3/uL (ref 0.0–0.5)
Eosinophils Relative: 0 %
HCT: 35.9 % — ABNORMAL LOW (ref 39.0–52.0)
Hemoglobin: 11.8 g/dL — ABNORMAL LOW (ref 13.0–17.0)
Lymphocytes Relative: 6 %
Lymphs Abs: 2.1 10*3/uL (ref 0.7–4.0)
MCH: 20.5 pg — ABNORMAL LOW (ref 26.0–34.0)
MCHC: 32.9 g/dL (ref 30.0–36.0)
MCV: 62.2 fL — ABNORMAL LOW (ref 80.0–100.0)
Monocytes Absolute: 1.9 10*3/uL — ABNORMAL HIGH (ref 0.1–1.0)
Monocytes Relative: 6 %
Neutro Abs: 29.7 10*3/uL — ABNORMAL HIGH (ref 1.7–7.7)
Neutrophils Relative %: 86 %
Platelets: 460 10*3/uL — ABNORMAL HIGH (ref 150–400)
RBC: 5.77 MIL/uL (ref 4.22–5.81)
RDW: 19.8 % — ABNORMAL HIGH (ref 11.5–15.5)
WBC: 34.6 10*3/uL — ABNORMAL HIGH (ref 4.0–10.5)
nRBC: 0 /100 WBC

## 2019-08-20 LAB — COMPREHENSIVE METABOLIC PANEL
ALT: 5 U/L (ref 0–44)
AST: 69 U/L — ABNORMAL HIGH (ref 15–41)
Albumin: 1.7 g/dL — ABNORMAL LOW (ref 3.5–5.0)
Alkaline Phosphatase: 64 U/L (ref 38–126)
Anion gap: 12 (ref 5–15)
BUN: 23 mg/dL (ref 8–23)
CO2: 26 mmol/L (ref 22–32)
Calcium: 8.1 mg/dL — ABNORMAL LOW (ref 8.9–10.3)
Chloride: 96 mmol/L — ABNORMAL LOW (ref 98–111)
Creatinine, Ser: 4.63 mg/dL — ABNORMAL HIGH (ref 0.61–1.24)
GFR calc Af Amer: 13 mL/min — ABNORMAL LOW (ref >60)
GFR calc non Af Amer: 11 mL/min — ABNORMAL LOW (ref >60)
Glucose, Bld: 99 mg/dL (ref 70–99)
Potassium: 4.2 mmol/L (ref 3.5–5.1)
Sodium: 134 mmol/L — ABNORMAL LOW (ref 135–145)
Total Bilirubin: 0.7 mg/dL (ref 0.3–1.2)
Total Protein: 6.6 g/dL (ref 6.5–8.1)

## 2019-08-20 LAB — MAGNESIUM: Magnesium: 1.6 mg/dL — ABNORMAL LOW (ref 1.7–2.4)

## 2019-08-20 LAB — PHOSPHORUS: Phosphorus: 3.6 mg/dL (ref 2.5–4.6)

## 2019-08-20 MED ORDER — VANCOMYCIN HCL IN DEXTROSE 750-5 MG/150ML-% IV SOLN: 750.0000 mg | INTRAVENOUS | 0 refills | Status: AC

## 2019-08-20 MED ORDER — DOXERCALCIFEROL 2.5 MCG PO CAPS: 2.5000 ug | ORAL_CAPSULE | ORAL | 0 refills | Status: DC

## 2019-08-20 MED ORDER — MAGNESIUM SULFATE 2 GM/50ML IV SOLN: 2.0000 g | Freq: Once | INTRAVENOUS | Status: DC

## 2019-08-20 MED ORDER — OXYCODONE HCL 5 MG PO TABS: 5.0000 mg | ORAL_TABLET | Freq: Three times a day (TID) | ORAL | 0 refills | Status: AC | PRN

## 2019-08-20 MED ORDER — MIDODRINE HCL 10 MG PO TABS: 10.0000 mg | ORAL_TABLET | Freq: Three times a day (TID) | ORAL | 0 refills | Status: AC

## 2019-08-20 NOTE — Discharge Summary (Addendum)
Physician Discharge Summary  Joseph BLUESTEIN Sr. HBZ:[id number] DOB: Sep 13, 1940 DOA: 08/09/2019  PCP: [name], MD  Admit date: 08/09/2019 Discharge date: 08/20/2019  Time spent: 40 minutes  Recommendations for Outpatient Follow-up:  1. Follow outpatient CBC/CMP 2. Follow leukocytosis outpatient - this has been persistent and slow to improve significantly -  He's been afebrile and surgical wound looks appropriate - would warrant additional 3. Follow outpatient with orthopedics in 1 week 4. Continue vancomycin after dialysis with last dose on Monday, 4/26 -  workup if persistent after course of abx has been completed 5. Follow up with cardiology outpatient for aortic stenosis 6. Follow up hypotension/orthostasis outpatient - midodrine increased to TID - w/u further outpatient as indicated 7. Heme positive stool and some blood streaked stools here - Hb stable - recommend outpatient follow up with gastroenterology 8. Follow pressure ulcer outpatient - stage 2 - continue wound care, frequent turns  Discharge Diagnoses:  Principal Problem:   Bacteremia due to Enterococcus Active Problems:   PVD (peripheral vascular disease) (HCC)   Gangrene of left foot (HCC)   Postoperative wound infection   Prolonged QT interval   Acute metabolic encephalopathy   Thrombocytosis (HCC)   Hyperkalemia   Left foot infection   Discharge Condition: stable  Diet recommendation: renal diet  Filed Weights   08/19/19 1230 08/19/19 1533 08/20/19 0424  Weight: 63.3 kg 63.9 kg 66.2 kg   History of present illness:  79 year old male with history of ESRD on HD MWF, diastolic CHF, moderate AS, PVD, HTN, IDA and bilateral transmetatarsal amputation brought to ED due to altered mental status during dialysis. 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  He was admitted for sepsis in the setting of postoperative left foot infection and found to have Enterococcus  bacteremia. ID, orthopedic surgery (Dr. Sharol Given) and nephrology following. Patient underwent left transtibial amputation by Dr. Sharol Given on 4/16. Repeat blood culture negative. TTE without vegetation but severe aortic stenosis. Cardiology consulted for TEE and evaluation of aortic stenosis.   Patient underwent TEE on 08/16/2019 that showed aortic valve stenosis with moderate AVR, mild MR, mild to moderate TR, severe atherosclerotic plaque in descending aorta and aortic arch but no valvular vegetation.  ID recommended vancomycin after each dialysis through April 27 (last dose after HD on Monday 4/26).  He's stable for discharge to SNF on 4/23.  See below for additional details.  Hospital Course:  Sepsis due to Enterococcus bacteremia  Postoperative left foot wound infection -patient had left transmetatarsal amputation on 3/24. -CRP and ESR mildly elevated. -Underwent left transtibial amputationwith wound VAC placementon 4/16 by Dr. Sharol Given. -Repeat blood culture on 4/14 NGTD x 5 -TTE without vegetation but severe aortic stenosis -TEE without vegetation but limited assessment by Inell Mimbs shadow from dense calcification on MV and AV. -ID recommended vancomycin after each dialysis with last dose on 4/26 -> renal has communicated recs with outpatient HD. -Ortho recommended outpatient follow-up in 1 week -Trend leukocytosis- worsened today, continue to monitor with treatment - no fevers   Hypotension Improved.   + orthostatics, now improved after bolus and increased midodrine Continue to follow outpatient.  TSH and cortisol wnl.  Acute metabolic encephalopathy: Likely due to the above. CT head without acute finding. No focal neuro deficit. Seems to have resolved. He is fairly oriented. -Manage sepsis/bacteremia as above. -Frequent reorientation and delirium precautions.  ESRD on HD M/W/Fat [facility] Bone marrow disorder Azotemia/hyperkalemia/hyponatremia/Anion gap metabolic  acidosis-resolved. Anemia  of renal disease-stable. Nephrology c/s, appreciate recs  Severe aortic stenosis/chronic diastolic CHF: No cardiopulmonary symptoms. Appears euvolemic. -Cardiology consulted-recommended outpatient follow-up after bacteremia is treated. TEE on 4/19 for bacteremia. -Fluid management by dialysis -Strict intake and output  Frequent PVCs: continue to monitor, EF 60-65%, BP precludes beta blocker at this time  Peripheral vascular disease:  -Resume Plavix  History of nonhemorrhagic CVA in 2000: No apparent neuro deficit on exam. -Continue home medications.  Positive Hemoccult  Blood Streaked Stool-H&H stable.  - Hb stable -> ?hemorrhoids - will need GI follow up outpatient  Hyperlipidemia:  -Continue home atorvastatin  Bandemia/thrombocytosis: Likely reactive from #1. Improving. -Continue monitoring  Debility -PT/OT recommended SNF.  Pressure injury Stage 2, frequent turns, continue wound care at SNF Pressure Injury 08/15/19 Sacrum (Active)  08/15/19 0300  Location: Sacrum  Location Orientation:   Staging:   Wound Description (Comments):   Present on Admission:    Procedures: 4/16-left transtibial amputation with wound VAC placement  Joseph 4/15 IMPRESSIONS    1. Since the last study on on 12/08/2018 aortic stenosis is now severe.  2. Left ventricular ejection fraction, by estimation, is 60 to 65%. The  left ventricle has normal function. The left ventricle has no regional  wall motion abnormalities. There is mild concentric left ventricular  hypertrophy. Left ventricular diastolic  parameters are consistent with Grade I diastolic dysfunction (impaired  relaxation).  3. Right ventricular systolic function is normal. The right ventricular  size is normal.  4. The mitral valve is normal in structure. Mild mitral valve  regurgitation. No evidence of mitral stenosis.  5. The aortic valve is normal in structure. Aortic valve  regurgitation is  not visualized. Severe aortic valve stenosis. Aortic valve mean gradient  measures 47.0 mmHg.  6. The inferior vena cava is normal in size with greater than 50%  respiratory variability, suggesting right atrial pressure of 3 mmHg.   TEE IMPRESSIONS    1. Left ventricular ejection fraction, by estimation, is 55 to 60%. The  left ventricle has normal function. Left ventricular endocardial border  not optimally defined to evaluate regional wall motion. There is mild left  ventricular hypertrophy.  2. Right ventricular systolic function is normal. The right ventricular  size is normal.  3. Left atrial size was mildly dilated. No left atrial/left atrial  appendage thrombus was detected. The LAA emptying velocity was 69 cm/s.  4. The mitral valve is degenerative. Mild mitral valve regurgitation.  5. The tricuspid valve is degenerative. Tricuspid valve regurgitation is  mild to moderate.  6. The aortic valve is severely calcified and restricted. Aortic valve  regurgitation is moderate. Severe aortic valve stenosis. Aortic valve  area, by VTI measures 0.69 cm. Aortic valve mean gradient measures 42.0  mmHg.  7. There is Severe (Grade IV) atheroma plaque involving the transverse  and descending aorta.  8. Possible tiny PFO with scant color flow seen between atrial septal  flap, with redundant atrial septum. Agitated saline contrast bubble study  was negative, with no evidence of any interatrial shunt.   Conclusion(s)/Recommendation(s): No evidence of vegetation/infective  endocarditis on this transesophageal  echocardiogram.   Consultations:  ID  Renal  cardiology  Discharge Exam: Vitals:   08/20/19 0424 08/20/19 0759  BP: (!) 99/58 110/65  Pulse: 96 89  Resp: 18 18  Temp: 97.6 F (36.4 C) 98.4 F (36.9 C)  SpO2: 98% 100%   Feeling ok, no complaints Daughter at bedside  General: No acute distress. Cardiovascular: Heart  sounds show [name]  regular rate, and rhythm Lungs: Clear to auscultation bilaterally  Abdomen: Soft, nontender, nondistended Neurological: Alert and oriented 3. Moves all extremities 4. Cranial nerves II through XII grossly intact. Skin: Warm and dry. No rashes or lesions. Extremities: L BKA, wound well appearing, staples in place  Discharge Instructions   Discharge Instructions    Call MD for:  difficulty breathing, headache or visual disturbances   Complete by: As directed    Call MD for:  extreme fatigue   Complete by: As directed    Call MD for:  persistant dizziness or light-headedness   Complete by: As directed    Call MD for:  persistant nausea and vomiting   Complete by: As directed    Call MD for:  redness, tenderness, or signs of infection (pain, swelling, redness, odor or green/yellow discharge around incision site)   Complete by: As directed    Call MD for:  severe uncontrolled pain   Complete by: As directed    Call MD for:  temperature >100.4   Complete by: As directed    Diet - low sodium heart healthy   Complete by: As directed    Diet - low sodium heart healthy   Complete by: As directed    Discharge instructions   Complete by: As directed    You were seen for bacteria in your blood and [name] post operative wound infection.  You had [name] left transtibial amputation.  You've been on an extended course of antibiotics.  You'll continue vancomycin after dialysis with your last dose on 4/26.   Follow up with orthopedics outpatient for continued wound checks and follow up.  Your white blood cell count is elevated.  Please follow this up as an outpatient.  Your midodrine was increased to three times Jory Welke day for your low blood pressures.  Please continue to follow this outpatient.  You need continued outpatient follow up for your aortic stenosis.  You also should have follow up outpatient for the blood seen in your stool.    Return for new, recurrent, or worsening symptoms.  Please  ask your PCP to request records from this hospitalization so they know what was done and what the next steps will be.   Discharge wound care:   Complete by: As directed    May Wash stump daily with mild soap and water. Apply shrinker or dry dressing   Increase activity slowly   Complete by: As directed    Increase activity slowly   Complete by: As directed    Negative Pressure Wound Therapy - Incisional   Complete by: As directed      Allergies as of 08/20/2019      Reactions   Aspirin Other (See Comments)   Causes internal bleeding  History of ulcers      Medication List    STOP taking these medications   acetaminophen 325 MG tablet Commonly known as: TYLENOL   doxycycline 100 MG capsule Commonly known as: VIBRAMYCIN   sevelamer carbonate 800 MG tablet Commonly known as: RENVELA     TAKE these medications   atorvastatin 40 MG tablet Commonly known as: LIPITOR Take 40 mg by mouth daily.   b complex-vitamin c-folic acid 0.8 MG Tabs tablet Take 1 tablet by mouth See admin instructions. Take one tablet daily on Tuesdays, Thursdays, Saturdays, and Sundays.   cholecalciferol 25 MCG (1000 UNIT) tablet Commonly known as: VITAMIN D3 Take 1,000 Units by mouth daily.   cinacalcet 60  MG tablet Commonly known as: SENSIPAR Take 60 mg by mouth every evening.   clopidogrel 75 MG tablet Commonly known as: PLAVIX Take 75 mg by mouth daily.   doxercalciferol 2.5 MCG capsule Commonly known as: [name] Take 1 capsule (2.5 mcg total) by mouth every Monday, Wednesday, and Friday with hemodialysis.   feeding supplement (NEPRO CARB STEADY) Liqd Take 237 mLs by mouth 2 (two) times daily between meals.   feeding supplement (PRO-STAT SUGAR FREE 64) Liqd Take 30 mLs by mouth in the morning and at bedtime.   melatonin 3 MG Tabs tablet Take 3 mg by mouth at bedtime.   midodrine 10 MG tablet Commonly known as: PROAMATINE Take 1 tablet (10 mg total) by mouth 3 (three) times  daily. What changed:   when to take this  additional instructions   oxyCODONE 5 MG immediate release tablet Commonly known as: Oxy IR/ROXICODONE Take 1-2 tablets (5-10 mg total) by mouth every 4 (four) hours as needed for moderate pain (pain score 4-6).   saccharomyces boulardii 250 MG capsule Commonly known as: FLORASTOR Take 250 mg by mouth 2 (two) times daily.   sennosides-docusate sodium 8.6-50 MG tablet Commonly known as: SENOKOT-S Take 1 tablet by mouth daily.   Vancomycin 750-5 MG/150ML-% Soln Commonly known as: VANCOCIN Inject 150 mLs (750 mg total) into the vein Every Tuesday,Thursday,and Saturday with dialysis for 2 doses. (last dose on 4/26 after dialysis) Start taking on: August 21, 2019   vitamin C 500 MG tablet Commonly known as: ASCORBIC ACID Take 500 mg by mouth daily.            Discharge Care Instructions  (From admission, onward)         Start     Ordered   08/20/19 0000  Discharge wound care:    Comments: May Wash stump daily with mild soap and water. Apply shrinker or dry dressing   08/20/19 1317         Allergies  Allergen Reactions  . Aspirin Other (See Comments)    Causes internal bleeding  History of ulcers    Contact information for follow-up providers    [name], MD In 1 week.   Specialty: Orthopedic Surgery Contact information: [place] [address] [phone]        [name], MD Follow up.   Specialty: Internal Medicine Contact information: [place] [address] [phone]        Satira Sark, MD .   Specialty: Cardiology Contact information: [place] [address] [phone]            Contact information for after-discharge care    [facility] .   Service: Skilled Nursing Contact information: [address] [place] [phone]                    The results of significant diagnostics from this hospitalization (including imaging, microbiology, ancillary and laboratory) are listed below for reference.    Significant Diagnostic Studies: DG Chest 2 View  Result Date: 08/10/2019 CLINICAL DATA:  Hiccups, post amputation LEFT foot, hypertension, end-stage renal disease EXAM: CHEST - 2 VIEW COMPARISON:  06/30/2019 FINDINGS: Normal heart size, mediastinal contours, and pulmonary vascularity. Atherosclerotic calcification aorta. Mild chronic elevation of LEFT diaphragm with LEFT basilar atelectasis. Lungs otherwise clear. No acute infiltrate, pleural effusion or pneumothorax. BILATERAL chronic rotator cuff tears. Prior rodding of RIGHT humerus.  IMPRESSION: Chronic LEFT basilar atelectasis. No acute abnormalities. Electronically Signed   By: Lavonia Dana M.D.   On: 08/10/2019 08:19   CT HEAD WO CONTRAST  Result Date: 08/10/2019 CLINICAL DATA:  Encephalopathy. EXAM: CT HEAD WITHOUT CONTRAST TECHNIQUE: Contiguous axial images were obtained from the base of the skull through the vertex without intravenous contrast. COMPARISON:  Head CT 03/05/2015, report from brain MRI 06/15/1999 (images unavailable). FINDINGS: Brain: There is no evidence of acute intracranial hemorrhage, intracranial mass, midline shift or extra-axial fluid collection.No evidence of acute demarcated cortical infarct. Redemonstrated small chronic cortically based infarct within the posterior right frontal lobe. Mild scattered hypoattenuation within the cerebral white matter is similar to prior exam and nonspecific, but consistent with chronic small vessel ischemic disease. Stable, mild generalized parenchymal atrophy. Vascular: No hyperdense vessel.  Atherosclerotic calcifications. Skull: Normal. Negative for fracture or focal lesion. Sinuses/Orbits: Visualized orbits demonstrate no acute abnormality. Mild right frontal sinus mucosal thickening. No significant mastoid effusion at the  imaged levels. IMPRESSION: No evidence of acute intracranial abnormality. Redemonstrated small chronic cortically based right frontal lobe infarct. Stable background mild generalized parenchymal atrophy and chronic small vessel ischemic disease. Right frontal sinus mucosal thickening. Electronically Signed   By: Kellie Simmering DO   On: 08/10/2019 13:52   DG Foot Complete Left  Result Date: 08/10/2019 CLINICAL DATA:  Postop, pain, discharge, odor EXAM: LEFT FOOT - COMPLETE 3+ VIEW COMPARISON:  Portable exam 0746 hours compared to 06/30/2019 FINDINGS: Interval transmetatarsal amputation. Linear bone fragment identified at amputation bed of first metatarsal. Osseous demineralization. Scattered atherosclerotic calcifications. No fracture, dislocation, or definite bone destruction. IMPRESSION: Postoperative changes of transmetatarsal amputation LEFT foot as above. If there is persistent clinical concern for osteomyelitis or soft tissue infection, consider MR. Electronically Signed   By: Lavonia Dana M.D.   On: 08/10/2019 08:16   ECHOCARDIOGRAM COMPLETE  Result Date: 08/12/2019    ECHOCARDIOGRAM REPORT   Patient Name:   Joseph ALLSBROOK Sr. Date of Exam: 08/12/2019 Medical Rec #:  [id number]       Height:       65.0 in Accession #:    [id number]      Weight:       140.9 lb Date of Birth:  05-24-1940       BSA:          1.704 m Patient Age:    61 years        BP:           134/66 mmHg Patient Gender: M               HR:           89 bpm. Exam Location:  Inpatient Procedure: 2D Joseph Indications:    Bacteremia 790.7 / R78.81  History:        Patient has prior history of Echocardiogram examinations, most                 recent 12/08/2018. Non-ischemic cardiomyopathy, CAD, Aortic                 stenosis; Risk Factors:Hypertension. ESRD on dialysis                 Prolonged QT interval.  Sonographer:    Vikki Ports Turrentine Referring Phys: [id number] Charlesetta Ivory GONFA  Sonographer Comments: Suboptimal subcostal window. Image acquisition  challenging due to uncooperative patient. IMPRESSIONS  1. Since the last study on on 12/08/2018 aortic stenosis is now severe.  2. Left ventricular ejection fraction, by estimation, is 60 to 65%. The left ventricle has normal function. The left ventricle has no regional wall motion abnormalities. There is mild concentric left ventricular hypertrophy. Left ventricular diastolic parameters are consistent with Grade I diastolic dysfunction (impaired relaxation).  3. Right ventricular systolic function is normal. The right ventricular size is normal.  4. The mitral valve is normal in structure. Mild mitral valve regurgitation. No evidence of mitral stenosis.  5. The aortic valve is normal in structure. Aortic valve regurgitation is not visualized. Severe aortic valve stenosis. Aortic valve mean gradient measures 47.0 mmHg.  6. The inferior vena cava is normal in size with greater than 50% respiratory variability, suggesting right atrial pressure of 3 mmHg. Conclusion(s)/Recommendation(s): No evidence of valvular vegetations on this transthoracic echocardiogram. Would recommend Nalani Andreen transesophageal echocardiogram to exclude infective endocarditis if clinically indicated. FINDINGS  Left Ventricle: Left ventricular ejection fraction, by estimation, is 60 to 65%. The left ventricle has normal function. The left ventricle has no regional wall motion abnormalities. The left ventricular internal cavity size was normal in size. There is  mild concentric left ventricular hypertrophy. Left ventricular diastolic parameters are consistent with Grade I diastolic dysfunction (impaired relaxation). Normal left ventricular filling pressure. Right Ventricle: The right ventricular size is normal. No increase in right ventricular wall thickness. Right ventricular systolic function is normal. Left Atrium: Left atrial size was normal in size. Right Atrium: Right atrial size was normal in size. Pericardium: There is no evidence of pericardial  effusion. Mitral Valve: The mitral valve is normal in structure. There is moderate thickening of the mitral valve leaflet(s). Normal mobility of the mitral valve leaflets. Severe mitral annular calcification. Mild mitral valve regurgitation. No evidence of mitral valve stenosis. Tricuspid Valve: The tricuspid valve is normal in structure. Tricuspid valve regurgitation is mild . No evidence of tricuspid stenosis. Aortic Valve: The aortic valve is normal in structure.. There is severe thickening and severe calcifcation of the aortic valve. Aortic valve regurgitation is not visualized. Severe aortic stenosis is present. There is severe thickening of the aortic valve. There is severe calcifcation of the aortic valve. Aortic valve mean gradient measures 47.0 mmHg. Aortic valve peak gradient measures 62.7 mmHg. Aortic valve area, by VTI measures 0.63 cm. Pulmonic Valve: The pulmonic valve was normal in structure. Pulmonic valve regurgitation is not visualized. No evidence of pulmonic stenosis. Aorta: The aortic root is normal in size and structure. Venous: The inferior vena cava is normal in size with greater than 50% respiratory variability, suggesting right atrial pressure of 3 mmHg. IAS/Shunts: No atrial level shunt detected by color flow Doppler.  LEFT VENTRICLE PLAX 2D LVIDd:         4.80 cm  Diastology LVIDs:         3.30 cm  LV e' lateral:   11.70 cm/s LV PW:         1.10 cm  LV E/e' lateral: 9.5 LV IVS:        1.10 cm  LV e' medial:    6.20 cm/s LVOT diam:     1.80 cm  LV E/e' medial:  17.9 LV SV:         44 LV SV Index:   26 LVOT Area:     2.54 cm  RIGHT VENTRICLE RV S prime:     10.70 cm/s TAPSE (M-mode): 2.6 cm LEFT ATRIUM             Index  RIGHT ATRIUM           Index LA diam:        3.80 cm 2.23 cm/m  RA Area:     15.70 cm LA Vol (A2C):   63.6 ml 37.32 ml/m RA Volume:   37.90 ml  22.24 ml/m LA Vol (A4C):   51.5 ml 30.22 ml/m LA Biplane Vol: 61.5 ml 36.08 ml/m  AORTIC VALVE AV Area (Vmax):     0.76 cm AV Area (Vmean):   0.70 cm AV Area (VTI):     0.63 cm AV Vmax:           395.80 cm/s AV Vmean:          287.800 cm/s AV VTI:            0.696 m AV Peak Grad:      62.7 mmHg AV Mean Grad:      47.0 mmHg LVOT Vmax:         118.00 cm/s LVOT Vmean:        79.000 cm/s LVOT VTI:          0.171 m LVOT/AV VTI ratio: 0.25  AORTA Ao Root diam: 2.80 cm MITRAL VALVE MV Area (PHT): 4.17 cm     SHUNTS MV Decel Time: 182 msec     Systemic VTI:  0.17 m MV E velocity: 111.00 cm/s  Systemic Diam: 1.80 cm MV Lavine Hargrove velocity: 147.00 cm/s MV E/Nahiara Kretzschmar ratio:  0.76 Ena Dawley MD Electronically signed by Ena Dawley MD Signature Date/Time: 08/12/2019/11:02:44 PM    Final    Joseph TEE  Result Date: 08/17/2019    TRANSESOPHOGEAL Joseph REPORT   Patient Name:   Joseph LANGENBACH Sr. Date of Exam: 08/16/2019 Medical Rec #:  [id number]       Height:       65.0 in Accession #:    [id number]      Weight:       138.0 lb Date of Birth:  July 06, 1940       BSA:          1.690 m Patient Age:    62 years        BP:           91/34 mmHg Patient Gender: M               HR:           87 bpm. Exam Location:  Inpatient Procedure: Transesophageal Joseph, Cardiac Doppler, Color Doppler and 3D Joseph Indications:     Aortic stenosis  History:         Patient has prior history of Echocardiogram examinations, most                  recent 08/12/2019. CHF, Stroke, Aortic Valve Disease and Aortic                  stenosis; Signs/Symptoms:Bacteremia.  Sonographer:     Dustin Flock Referring Phys:  [name] MD Diagnosing Phys: Cherlynn Kaiser MD PROCEDURE: After discussion of the risks and benefits of Henley Boettner TEE, an informed consent was obtained from [name] family member. TEE procedure time was 41 minutes. The transesophogeal probe was passed without difficulty through the esophogus of the patient. Imaged  were obtained with the patient in Tanza Pellot left lateral decubitus position. Sedation performed by different physician. The patient was monitored while under deep sedation.  Anesthestetic sedation was provided intravenously by Anesthesiology: 263.46m of Propofol. Image quality was  good. The patient's vital signs; including heart rate, blood pressure, and oxygen saturation; remained stable throughout the procedure. The patient developed no complications during the procedure. IMPRESSIONS  1. Left ventricular ejection fraction, by estimation, is 55 to 60%. The left ventricle has normal function. Left ventricular endocardial border not optimally defined to evaluate regional wall motion. There is mild left ventricular hypertrophy.  2. Right ventricular systolic function is normal. The right ventricular size is normal.  3. Left atrial size was mildly dilated. No left atrial/left atrial appendage thrombus was detected. The LAA emptying velocity was 69 cm/s.  4. The mitral valve is degenerative. Mild mitral valve regurgitation.  5. The tricuspid valve is degenerative. Tricuspid valve regurgitation is mild to moderate.  6. The aortic valve is severely calcified and restricted. Aortic valve regurgitation is moderate. Severe aortic valve stenosis. Aortic valve area, by VTI measures 0.69 cm. Aortic valve mean gradient measures 42.0 mmHg.  7. 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  8. Possible tiny PFO with scant color flow seen between atrial septal flap, with redundant atrial septum. Agitated saline contrast bubble study was negative, with no evidence of any interatrial shunt. Conclusion(s)/Recommendation(s): No evidence of vegetation/infective endocarditis on this transesophageal echocardiogram. FINDINGS  Left Ventricle: Left ventricular ejection fraction, by estimation, is 55 to 60%. The left ventricle has normal function. Left ventricular endocardial border not optimally defined to evaluate regional wall motion. The left ventricular internal cavity size was normal in size. There is mild left ventricular hypertrophy. Right Ventricle: The right  ventricular size is normal. No increase in right ventricular wall thickness. Right ventricular systolic function is normal. Left Atrium: Left atrial size was mildly dilated. No left atrial/left atrial appendage thrombus was detected. The LAA emptying velocity was 69 cm/s. Right Atrium: Right atrial size was normal in size. Pericardium: There is no evidence of pericardial effusion. Mitral Valve: The mitral valve is degenerative in appearance. There is moderate calcification of the mitral valve leaflet(s). Mild mitral valve regurgitation. Tricuspid Valve: The tricuspid valve is degenerative in appearance. Tricuspid valve regurgitation is mild to moderate. Aortic Valve: Vena contracta 0.33 cm. The aortic valve is abnormal. Aortic valve regurgitation is moderate. Aortic regurgitation PHT measures 456 msec. Severe aortic stenosis is present. Moderate aortic valve annular calcification. There is severe calcifcation of the aortic valve. Aortic valve mean gradient measures 42.0 mmHg. Aortic valve peak gradient measures 81.0 mmHg. Aortic valve area, by VTI measures 0.69 cm. Pulmonic Valve: The pulmonic valve was normal in structure. Pulmonic valve regurgitation is trivial. Aorta: The aortic root and ascending aorta are structurally normal, with no evidence of dilitation. There is severe (Grade IV) atheroma plaque involving the transverse and descending aorta. IAS/Shunts: Possible tiny PFO with scant color flow seen between atrial septal flap, with redundant atrial septum. Agitated saline contrast was given intravenously to evaluate for intracardiac shunting. Agitated saline contrast bubble study was negative,  with no evidence of any interatrial shunt.  LEFT VENTRICLE PLAX 2D LVOT diam:     2.10 cm LV SV:         65 LV SV Index:   39 LVOT Area:     3.46 cm  AORTIC VALVE AV Area (Vmax):    0.73 cm AV Area (Vmean):   0.67 cm AV Area (VTI):     0.69 cm AV Vmax:           450.00 cm/s AV Vmean:  301.000 cm/s AV VTI:             0.937 m AV Peak Grad:      81.0 mmHg AV Mean Grad:      42.0 mmHg LVOT Vmax:         94.30 cm/s LVOT Vmean:        58.100 cm/s LVOT VTI:          0.188 m LVOT/AV VTI ratio: 0.20 AI PHT:            456 msec  SHUNTS Systemic VTI:  0.19 m Systemic Diam: 2.10 cm Cherlynn Kaiser MD Electronically signed by Cherlynn Kaiser MD Signature Date/Time: 08/17/2019/12:27:16 PM    Final     Microbiology: Recent Results (from the past 240 hour(s))  Culture, blood (routine x 2)     Status: None   Collection Time: 08/11/19 10:36 AM   Specimen: BLOOD RIGHT HAND  Result Value Ref Range Status   Specimen Description BLOOD RIGHT HAND  Final   Special Requests   Final    BOTTLES DRAWN AEROBIC ONLY Blood Culture adequate volume   Culture   Final    NO GROWTH 5 DAYS Performed at [facility], [address], [place], [place] [address]    Report Status 08/16/2019 FINAL  Final  Culture, blood (routine x 2)     Status: None   Collection Time: 08/11/19 10:39 AM   Specimen: BLOOD RIGHT HAND  Result Value Ref Range Status   Specimen Description BLOOD RIGHT HAND  Final   Special Requests   Final    BOTTLES DRAWN AEROBIC ONLY Blood Culture results may not be optimal due to an inadequate volume of blood received in culture bottles   Culture   Final    NO GROWTH 5 DAYS Performed at [facility], [place] [address], [place], [place] [address]    Report Status 08/16/2019 FINAL  Final  SARS CORONAVIRUS 2 (TAT 6-24 HRS) Nasopharyngeal Nasopharyngeal Swab     Status: None   Collection Time: 08/17/19  6:23 PM   Specimen: Nasopharyngeal Swab  Result Value Ref Range Status   SARS Coronavirus 2 NEGATIVE NEGATIVE Final    Comment: (NOTE) SARS-CoV-2 target nucleic acids are NOT DETECTED. The SARS-CoV-2 RNA is generally detectable in upper and lower respiratory specimens during the acute phase of infection. Negative results do not preclude SARS-CoV-2 infection, do not rule out co-infections with other  pathogens, and should not be used as the sole basis for treatment or other patient management decisions. Negative results must be combined with clinical observations, patient history, and epidemiological information. The expected result is Negative. Fact Sheet for Patients: SugarRoll.be Fact Sheet for Healthcare Providers: https://www.woods-mathews.com/ This test is not yet approved or cleared by the [place] FDA and  has been authorized for detection and/or diagnosis of SARS-CoV-2 by FDA under an Emergency Use Authorization (EUA). This EUA will remain  in effect (meaning this test can be used) for the duration of the COVID-19 declaration under Section 56 4(b)(1) of the Act, 21 U.S.C. section 360bbb-3(b)(1), unless the authorization is terminated or revoked sooner. Performed at [facility], [place] [address] [place], [place] [address]      Labs: Basic Metabolic Panel: Recent Labs  Lab 08/14/19 0509 08/18/19 1025 08/19/19 0432 08/20/19 1045  NA 137 137 135 134*  K 4.0 4.4 5.0 4.2  CL 98 101 98 96*  CO2 25 22 22 26   GLUCOSE 72 96  84 99  BUN 19 25* 36* 23  CREATININE 4.01* 5.10* 6.14* 4.63*  CALCIUM 8.3* 8.2* 8.4* 8.1*  MG 1.9 1.7 1.8 1.6*  PHOS 2.1*  --  3.7 3.6   Liver Function Tests: Recent Labs  Lab 08/14/19 0509 08/18/19 1025 08/19/19 0432 08/20/19 1045  AST  --  82* 88* 69*  ALT  --  <5 5 5   ALKPHOS  --  58 65 64  BILITOT  --  0.8 0.8 0.7  PROT  --  6.3* 7.1 6.6  ALBUMIN 1.7* 1.5* 1.7* 1.7*   No results for input(s): LIPASE, AMYLASE in the last 168 hours. No results for input(s): AMMONIA in the last 168 hours. CBC: Recent Labs  Lab 08/14/19 0509 08/14/19 0509 08/16/19 0231 08/17/19 0627 08/18/19 0240 08/19/19 0432 08/20/19 0738  WBC 31.3*   < > 32.7* 30.9* 27.6* 35.5* 34.6*  NEUTROABS 26.6*  --   --   --   --  29.5* 29.7*  HGB 11.0*   < > 10.9* 10.3* 11.0* 11.9* 11.8*  HCT 33.3*   < > 33.2* 31.3*  33.1* 36.3* 35.9*  MCV 61.3*   < > 62.1* 61.6* 62.0* 61.7* 62.2*  PLT 404*   < > 381 334 348 398 460*   < > = values in this interval not displayed.   Cardiac Enzymes: No results for input(s): CKTOTAL, CKMB, CKMBINDEX, TROPONINI in the last 168 hours. BNP: BNP (last 3 results) No results for input(s): BNP in the last 8760 hours.  ProBNP (last 3 results) No results for input(s): PROBNP in the last 8760 hours.  CBG: No results for input(s): GLUCAP in the last 168 hours.     Signed:  Fayrene Helper MD.  [facility] 08/20/2019, 1:33 PM

## 2019-08-20 NOTE — Progress Notes (Signed)
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 No c/o this AM . HD yesterday, IDH, not much UF . Likely to SNF today  04/22 0701 - 04/23 0700 In: 200 [P.O.:200] Out: -70   Filed Weights   08/19/19 1230 08/19/19 1533 08/20/19 0424  Weight: 63.3 kg 63.9 kg 66.2 kg    Scheduled Meds: . atorvastatin  40 mg Oral Daily  . Chlorhexidine Gluconate Cloth  6 each Topical Q0600  . cinacalcet  60 mg Oral QPM  . clopidogrel  75 mg Oral Daily  . docusate sodium  100 mg Oral BID  . doxercalciferol  2.5 mcg Oral Q M,W,F-HD  . melatonin  3 mg Oral QHS  . midodrine  10 mg Oral TID  . multivitamin  1 tablet Oral QHS  . saccharomyces boulardii  250 mg Oral BID  . senna-docusate  1 tablet Oral Daily   Continuous Infusions: . vancomycin Stopped (08/19/19 1714)   PRN Meds:.acetaminophen **OR** acetaminophen, albuterol, HYDROmorphone (DILAUDID) injection, oxyCODONE  Current Labs: reviewed    Physical Exam:  Blood pressure 110/65, pulse 89, temperature 98.4 F (36.9 C), temperature source Oral, resp. rate 18, height 5' 5"  (1.651 m), weight 66.2 kg, SpO2 100 %. chronicall ill apparing LLE AVG +B/T Regular nl s1s2 S/nt/nd L BKA site with wound vac CTAB  Dialysis prescription: Monday/Wednesday/Friday, 4 hours, BFR 400/DFR 800, 2K/2.5 calcium, heparin 6000 units, left femoral loop graft, EDW 66 kg.  [name] mcg 3 times weekly, Sensipar 90 mg daily.  A 1. ESRD MWF [facility] LLE AVG 2. 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 AMS likely from #2 and #4, improving   4. Enterococcus bacteremia; Vanc, RCID following; TEE 4/19 negative for IE; Vanc dosed with HD through 08/23/19 5. Leukocytosis, slowly improving 6. HTN 7. PVD 8. Hx/o CVA  9. 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 Aortic stenosis, severe 13. Debility to SNF at DC  P . HD [facility]  this week, then back on MWF next week: 2K, 3h, 400/800 LLE AVG no heparin, 1-2L UF as BP permits . OK for next HD on Monday I think if discharged today, little PO intake, fluids stable, K and BUN ok . Medication Issues; o Preferred narcotic agents for pain control are hydromorphone, fentanyl, and methadone. Morphine should not be used.  o Baclofen should be avoided o Avoid oral sodium phosphate and magnesium citrate based laxatives / bowel preps    Pearson Grippe MD 08/20/2019, 11:54 AM  Recent Labs  Lab 08/14/19 0509 08/18/19 1025 08/19/19 0432  NA 137 137 135  K 4.0 4.4 5.0  CL 98 101 98  CO2 25 22 22   GLUCOSE 72 96 84  BUN 19 25* 36*  CREATININE 4.01* 5.10* 6.14*  CALCIUM 8.3* 8.2* 8.4*  PHOS 2.1*  --  3.7   Recent Labs  Lab 08/14/19 0509 08/16/19 0231 08/18/19 0240 08/19/19 0432 08/20/19 0738  WBC 31.3*   < > 27.6* 35.5* 34.6*  NEUTROABS 26.6*  --   --  29.5* 29.7*  HGB 11.0*   < > 11.0* 11.9* 11.8*  HCT 33.3*   < > 33.1* 36.3* 35.9*  MCV 61.3*   < > 62.0* 61.7* 62.2*  PLT 404*   < > 348 398 460*   < > = values in  this interval not displayed.

## 2019-08-20 NOTE — Progress Notes (Signed)
Pt discharged today to [facility] via [facility].  Pt's IV removed.  Pt taken off telemetry and CCMD notified.  Pt left with all of their personal belongings.  All AVS documentation reviewed and sent with Pt/PTAR.  Report given to PTAR.  Report given to [place] RN.

## 2019-08-20 NOTE — TOC Transition Note (Addendum)
Transition of Care [facility] - CM/SW Discharge Note   Patient Details  Name: Joseph HATHORNE Sr. MRN: [id number] Date of Birth: 1940-06-25  [facility] CM/SW Contact:  Ralph Dowdy, [place] Work Phone Number: 08/20/2019, 2:30 PM   Clinical Narrative:    Patient will DC to: [facility]  Anticipated DC date: 08/20/2019  Family notified: [name], Daughter  Transport by: Corey Harold        Per MD patient ready for DC to [facility] room #B23, nurse Roselyn Reef . RN, patient, patient's family, and facility notified of DC. Discharge Summary and FL2 sent to facility. RN to call report prior to discharge ([phone]). DC packet on chart. Ambulance transport requested for patient.     CSW will sign off for now as social work intervention is no longer needed. Please consult [place] again if new needs arise.   Final next level of care: Skilled Nursing Facility Barriers to Discharge: Barriers Resolved   Patient Goals and CMS Choice Patient states their goals for this hospitalization and ongoing recovery are:: To get well enough to go to rehab CMS Medicare.gov Compare Post Acute Care list provided to:: Patient Choice offered to / list presented to : Patient  Discharge Placement   Existing PASRR number confirmed : 08/20/19          Patient chooses bed at: [facility] Patient to be transferred to facility by: [place] Name of family member notified: [name], daughter Patient and family notified of of transfer: 08/20/19  Discharge Plan and Services                                     Social Determinants of Health (SDOH) Interventions     Readmission Risk Interventions No flowsheet data found.

## 2019-08-20 NOTE — Progress Notes (Signed)
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  Wound VAC is 0 in the canister.  Wound VAC was removed.  Some maceration of skin but wound is healing nicely with well opposed wound edges and no necrosis.  Shrinker was applied and should be changed daily.  Patient will need 1 week follow-up in the office with PA or nurse practitioner

## 2019-08-22 DIAGNOSIS — D631 Anemia in chronic kidney disease: Secondary | ICD-10-CM | POA: Diagnosis not present

## 2019-08-22 DIAGNOSIS — N186 End stage renal disease: Secondary | ICD-10-CM | POA: Diagnosis not present

## 2019-08-22 DIAGNOSIS — D509 Iron deficiency anemia, unspecified: Secondary | ICD-10-CM | POA: Diagnosis not present

## 2019-08-22 DIAGNOSIS — N2581 Secondary hyperparathyroidism of renal origin: Secondary | ICD-10-CM | POA: Diagnosis not present

## 2019-08-22 DIAGNOSIS — R7881 Bacteremia: Secondary | ICD-10-CM | POA: Diagnosis not present

## 2019-08-22 DIAGNOSIS — Z992 Dependence on renal dialysis: Secondary | ICD-10-CM | POA: Diagnosis not present

## 2019-08-23 DIAGNOSIS — R7881 Bacteremia: Secondary | ICD-10-CM | POA: Diagnosis not present

## 2019-08-23 DIAGNOSIS — N2581 Secondary hyperparathyroidism of renal origin: Secondary | ICD-10-CM | POA: Diagnosis not present

## 2019-08-23 DIAGNOSIS — Z992 Dependence on renal dialysis: Secondary | ICD-10-CM | POA: Diagnosis not present

## 2019-08-23 DIAGNOSIS — D509 Iron deficiency anemia, unspecified: Secondary | ICD-10-CM | POA: Diagnosis not present

## 2019-08-23 DIAGNOSIS — D631 Anemia in chronic kidney disease: Secondary | ICD-10-CM | POA: Diagnosis not present

## 2019-08-23 DIAGNOSIS — N186 End stage renal disease: Secondary | ICD-10-CM | POA: Diagnosis not present

## 2019-08-24 ENCOUNTER — Telehealth (INDEPENDENT_AMBULATORY_CARE_PROVIDER_SITE_OTHER): Payer: Medicare Other | Admitting: Cardiology

## 2019-08-24 ENCOUNTER — Encounter: Payer: Self-pay | Admitting: Cardiology

## 2019-08-24 ENCOUNTER — Other Ambulatory Visit: Payer: Self-pay

## 2019-08-24 VITALS — BP 108/60 | HR 76 | Temp 98.7°F | Resp 18 | Wt 145.0 lb

## 2019-08-24 DIAGNOSIS — Z992 Dependence on renal dialysis: Secondary | ICD-10-CM

## 2019-08-24 DIAGNOSIS — Z09 Encounter for follow-up examination after completed treatment for conditions other than malignant neoplasm: Secondary | ICD-10-CM

## 2019-08-24 DIAGNOSIS — Z89432 Acquired absence of left foot: Secondary | ICD-10-CM

## 2019-08-24 DIAGNOSIS — I350 Nonrheumatic aortic (valve) stenosis: Secondary | ICD-10-CM | POA: Diagnosis not present

## 2019-08-24 DIAGNOSIS — N186 End stage renal disease: Secondary | ICD-10-CM | POA: Diagnosis not present

## 2019-08-24 DIAGNOSIS — I120 Hypertensive chronic kidney disease with stage 5 chronic kidney disease or end stage renal disease: Secondary | ICD-10-CM | POA: Diagnosis not present

## 2019-08-24 DIAGNOSIS — I739 Peripheral vascular disease, unspecified: Secondary | ICD-10-CM | POA: Diagnosis not present

## 2019-08-24 DIAGNOSIS — L089 Local infection of the skin and subcutaneous tissue, unspecified: Secondary | ICD-10-CM | POA: Diagnosis not present

## 2019-08-24 NOTE — Patient Instructions (Signed)
Medication Instructions: Your physician recommends that you continue on your current medications as directed. Please refer to the Current Medication list given to you today.   Labwork: None today  Procedures/Testing: None today  Follow-Up: 6 months office visit with Dr.McDowell  Any Additional Special Instructions Will Be Listed Below (If Applicable).     If you need a refill on your cardiac medications before your next appointment, please call your pharmacy.  Your physician recommends that you continue on your current medications as directed. Please refer to the Current Medication list given to you today.

## 2019-08-24 NOTE — Progress Notes (Signed)
Virtual Visit via Telephone Note   This visit type was conducted due to national recommendations for restrictions regarding the COVID-19 Pandemic (e.g. social distancing) in an effort to limit this patient's exposure and mitigate transmission in our community.  Due to his co-morbid illnesses, this patient is at least at moderate risk for complications without adequate follow up.  This format is felt to be most appropriate for this patient at this time.  The patient did not have access to video technology/had technical difficulties with video requiring transitioning to audio format only (telephone).  All issues noted in this document were discussed and addressed.  No physical exam could be performed with this format.  Please refer to the patient's chart for his  consent to telehealth for [name].   The patient was identified using 2 identifiers.  Date:  08/24/2019   ID:  Joseph Maryland Sr., DOB May 19, 1940, MRN [id number]  Patient Location: [place] Provider Location: Office  PCP:  [name], MD  Cardiologist:  Rozann Lesches, MD Electrophysiologist:  None   Evaluation Performed:  Follow-Up Visit  Chief Complaint:  Cardiac follow-up  History of Present Illness:    [name]. is a 79 y.o. male last seen in September 2020.  I reviewed extensive interval records.  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  He was followed by our cardiology service, underwent a TEE that did not demonstrate any valvular vegetations, but did not confirm severe calcific aortic stenosis which was known at baseline.  He was discharged to a nursing facility [facility]).  I spoke with Shanon Brow at [facility], also Mr. Younge by phone.  Patient states that he is doing reasonably well, tolerating hemodialysis (apparently being treated with vancomycin during hemodialysis  sessions).  Also doing physical therapy.  He anticipates going back home eventually.  He does not report any chest pain, no palpitations or syncope.  I reviewed his cardiac testing obtained during recent hospital stay and outlined below.  Testing for COVID-19 was negative during his hospital evaluation.  Past Medical History:  Diagnosis Date  . Anxiety   . Aortic stenosis   . Arthritis   . ESRD (end stage renal disease) on dialysis [facility])    M/W/F at [facility] in [place]  . Essential hypertension   . Gangrene of right foot ([place])   . Gastric ulcer 2004  . GI bleed   . History of blood transfusion   . History of cardiomyopathy    LVEF normal as of February 2017  . History of gastric ulcer   . History of stroke    Left side weakness  . Iron deficiency anemia   . Osteomyelitis ([place])   . Peripheral vascular disease [facility])    Past Surgical History:  Procedure Laterality Date  . ABDOMINAL AORTAGRAM N/A 01/24/2012   Procedure: ABDOMINAL Maxcine Ham;  Surgeon: Elam Dutch, MD;  [facility] C[facility] LAB;  Service: Cardiovascular;  Laterality: N/A;  . ABDOMINAL AORTOGRAM W/LOWER EXTREMITY N/A 04/15/2017   Procedure: ABDOMINAL AORTOGRAM W/LOWER EXTREMITY;  Surgeon: Angelia Mould, MD;  Location: [place] CV LAB;  Service: Cardiovascular;  Laterality: N/A;  . AMPUTATION Right 05/09/2017   Procedure: RIGHT TRANSMETATARSAL AMPUTATION;  Surgeon: [name], MD;  Location: [place];  Service: Orthopedics;  Laterality: Right;  . AMPUTATION Right 06/13/2018   Procedure: AMPUTATION RIGHT LONG FINGER;  Surgeon: Dayna Barker, MD;  Location: [place];  Service: Plastics;  Laterality: Right;  . AMPUTATION Left 07/21/2019   Procedure: LEFT TRANSMETATARSAL AMPUTATION;  Surgeon: [name], MD;  Location: [place];  Service: Orthopedics;  Laterality: Left;  . AMPUTATION Left 08/13/2019   Procedure: AMPUTATION BELOW KNEE;  Surgeon: [name], MD;  Location: [place];  Service: Orthopedics;   Laterality: Left;  . ARTERIOVENOUS GRAFT PLACEMENT Right right arm  . AV FISTULA PLACEMENT Left 08/31/2015   Procedure: ARTERIOVENOUS (AV) FISTULA CREATION- LEFT RADIOCEPHALIC;  Surgeon: Mal Misty, MD;  Location: [place];  Service: Vascular;  Laterality: Left;  . AV FISTULA PLACEMENT Right 02/18/2017   Procedure: INSERTION OF ARTERIOVENOUS (AV) GORE-TEX GRAFT  RIGHT UPPER ARM;  Surgeon: Conrad [place], MD;  Location: [place];  Service: Vascular;  Laterality: Right;  . AV FISTULA PLACEMENT Left 12/01/2018   Procedure: INSERTION OF ARTERIOVENOUS (AV) GORE-TEX GRAFT ARM;  Surgeon: Angelia Mould, MD;  Location: [employer];  Service: Vascular;  Laterality: Left;  . AV FISTULA PLACEMENT Left 02/09/2019   Procedure: INSERTION OF ARTERIOVENOUS (AV) GORE-TEX GRAFT LEFT THIGH;  Surgeon: Waynetta Sandy, MD;  Location: [place];  Service: Vascular;  Laterality: Left;  . BASCILIC VEIN TRANSPOSITION Left 12/19/2015   Procedure: FIRST STAGE BRACHIAL VEIN TRANSPOSITION;  Surgeon: Conrad [place], MD;  Location: [place];  Service: Vascular;  Laterality: Left;  . BASCILIC VEIN TRANSPOSITION Left 02/08/2016   Procedure: SECOND STAGE BRACHIAL VEIN TRANSPOSITION;  Surgeon: Conrad [place], MD;  Location: [place];  Service: Vascular;  Laterality: Left;  . BUBBLE STUDY  08/16/2019   Procedure: BUBBLE STUDY;  Surgeon: Elouise Munroe, MD;  Location: [place];  Service: Cardiovascular;;  . CARDIAC CATHETERIZATION N/A 07/11/2015   Procedure: Left Heart Cath and Coronary Angiography;  Surgeon: Troy Sine, MD;  Location: [place] CV LAB;  Service: Cardiovascular;  Laterality: N/A;  . Carpel Tunnel Left Dec. 22, 2016  . CHOLECYSTECTOMY    . COLONOSCOPY  2004   Dr. Irving Shows, left sided diverticula and cecal polyp, path unknown  . COLONOSCOPY  10/29/2011   Procedure: COLONOSCOPY;  Surgeon: Daneil Dolin, MD;  [facility];  Service: Endoscopy;  Laterality: N/A;  10:15  . ESOPHAGOGASTRODUODENOSCOPY   11/2002   Dr. Gala Romney, erosive reflux esophagitis, multiple gastric ulcer and antral/bulbar erosions. Serologies positive for H.Pylori and was treated  . ESOPHAGOGASTRODUODENOSCOPY  11/20014   Dr. Gala Romney, small hh only, ulcers healed  . ESOPHAGOGASTRODUODENOSCOPY  09/21/2011   Dr Trevor Iha [facility], antral erosions, ?early GAVE  . FISTULOGRAM Left 12/10/2016   Procedure: THROMBECTOMY OF LEFT ARM ARTERIOVENOUS FISTULA;  Surgeon: Waynetta Sandy, MD;  Location: [place];  Service: Vascular;  Laterality: Left;  . INSERTION OF DIALYSIS CATHETER Left 12/10/2016   Procedure: INSERTION OF TUNNELED DIALYSIS CATHETER;  Surgeon: Waynetta Sandy, MD;  Location: [place];  Service: Vascular;  Laterality: Left;  . INSERTION OF DIALYSIS CATHETER Right 06/09/2018   Procedure: INSERTION OF DIALYSIS CATHETER Right subclavian;  Surgeon: Angelia Mould, MD;  Location: [place];  Service: Vascular;  Laterality: Right;  . IR DIALY SHUNT INTRO [place] W/IMG RIGHT Right 01/01/2018  . IR GENERIC HISTORICAL  07/16/2016   IR REMOVAL TUN CV CATH W/O FL 07/16/2016 Saverio Danker, PA-C [facility]-INTERV RAD  . IR PTA ADDL CENTRAL DIALYSIS SEG THRU DIALY CIRCUIT RIGHT Right 10/21/2017  . IR REMOVAL TUN CV CATH W/O FL  05/12/2017  .  IR THROMBECTOMY AV FISTULA W/THROMBOLYSIS/PTA INC/SHUNT/IMG RIGHT Right 10/21/2017  . IR THROMBECTOMY AV FISTULA W/THROMBOLYSIS/PTA INC/SHUNT/IMG RIGHT Right 11/27/2017  . IR US GUIDE VASC ACCESS RIGHT  10/21/2017  . IR US GUIDE VASC ACCESS RIGHT  11/27/2017  . IR US GUIDE VASC ACCESS RIGHT  01/01/2018  . LIGATION ARTERIOVENOUS GORTEX GRAFT Right 06/09/2018  . LIGATION ARTERIOVENOUS GORTEX GRAFT Right 06/09/2018   Procedure: LIGATION ARTERIOVENOUS GORTEX GRAFT RIGHT ARM;  Surgeon: Angelia Mould, MD;  Location: [place];  Service: Vascular;  Laterality: Right;  . LIGATION ARTERIOVENOUS GORTEX GRAFT Left 12/31/2018   Procedure: LIGATION OF LEFT UPPER ARM ARTERIVENOUS GORTEX GRAFT, LEFT  BRACHIAL ARTERY ENDARTERECTOMY WITH BOVINE PATCH ANGIOPLASTY;  Surgeon: Waynetta Sandy, MD;  Location: [place];  Service: Vascular;  Laterality: Left;  . LIGATION OF ARTERIOVENOUS  FISTULA Left 12/19/2015   Procedure: LIGATION OF RADIOCEPHALIC ARTERIOVENOUS  FISTULA;  Surgeon: Conrad [place], MD;  Location: [place];  Service: Vascular;  Laterality: Left;  Marland Kitchen MASS EXCISION Right 02/18/2017   Procedure: EXCISION OF RIGHT AXILLARY EPIDERMAL INCLUSION CYST;  Surgeon: Conrad [place], MD;  Location: [place];  Service: Vascular;  Laterality: Right;  . PERIPHERAL VASCULAR CATHETERIZATION N/A 12/14/2015   Procedure: Fistulagram;  Surgeon: Conrad [place], MD;  Location: [place] CV LAB;  Service: Cardiovascular;  Laterality: N/A;  . SHOULDER SURGERY Right    fracture  . TEE WITHOUT CARDIOVERSION N/A 08/16/2019   Procedure: TRANSESOPHAGEAL ECHOCARDIOGRAM (TEE);  Surgeon: Elouise Munroe, MD;  Location: [place];  Service: Cardiovascular;  Laterality: N/A;  . ULTRASOUND GUIDANCE FOR VASCULAR ACCESS  04/15/2017   Procedure: Ultrasound Guidance For Vascular Access;  Surgeon: Angelia Mould, MD;  Location: [place] CV LAB;  Service: Cardiovascular;;  . UPPER EXTREMITY VENOGRAPHY Bilateral 12/17/2016   Procedure: Bilateral Upper Extremity Venography;  Surgeon: Serafina Mitchell, MD;  Location: [name] CV LAB;  Service: Cardiovascular;  Laterality: Bilateral;  . UPPER EXTREMITY VENOGRAPHY Left 11/03/2018   Procedure: UPPER EXTREMITY VENOGRAPHY;  Surgeon: Serafina Mitchell, MD;  Location: [place] CV LAB;  Service: Cardiovascular;  Laterality: Left;     Current Meds  Medication Sig  . Amino Acids-Protein Hydrolys (FEEDING SUPPLEMENT, PRO-STAT SUGAR FREE 64,) LIQD Take 30 mLs by mouth in the morning and at bedtime.  Marland Kitchen atorvastatin (LIPITOR) 40 MG tablet Take 40 mg by mouth daily.  Marland Kitchen b complex-vitamin c-folic acid (NEPHRO-VITE) 0.8 MG TABS Take 1 tablet by mouth See admin instructions. Take one  tablet daily on Tuesdays, Thursdays, Saturdays, and Sundays.  . cholecalciferol (VITAMIN D3) 25 MCG (1000 UT) tablet Take 1,000 Units by mouth daily.  . cinacalcet (SENSIPAR) 60 MG tablet Take 60 mg by mouth every evening.   . clopidogrel (PLAVIX) 75 MG tablet Take 75 mg by mouth daily.  Marland Kitchen doxercalciferol ([name]) 2.5 MCG capsule Take 1 capsule (2.5 mcg total) by mouth every Monday, Wednesday, and Friday with hemodialysis.  Marland Kitchen melatonin 3 MG TABS tablet Take 3 mg by mouth at bedtime.  . midodrine (PROAMATINE) 10 MG tablet Take 1 tablet (10 mg total) by mouth 3 (three) times daily.  . Nutritional Supplements (FEEDING SUPPLEMENT, NEPRO CARB STEADY,) LIQD Take 237 mLs by mouth 2 (two) times daily between meals.  . saccharomyces boulardii (FLORASTOR) 250 MG capsule Take 250 mg by mouth 2 (two) times daily.  . sennosides-docusate sodium (SENOKOT-S) 8.6-50 MG tablet Take 1 tablet by mouth daily.  . Vancomycin (VANCOCIN) 750-5 MG/150ML-% SOLN Inject 150 mLs (750 mg  total) into the vein Every Tuesday,Thursday,and Saturday with dialysis for 2 doses. (last dose on 4/26 after dialysis)  . vitamin C (ASCORBIC ACID) 500 MG tablet Take 500 mg by mouth daily.     Allergies:   Aspirin   ROS:   No breathlessness, no chills.  Prior CV studies:   The following studies were reviewed today:  Echocardiogram 08/12/2019: 1. Since the last study on on 12/08/2018 aortic stenosis is now severe.  2. Left ventricular ejection fraction, by estimation, is 60 to 65%. The  left ventricle has normal function. The left ventricle has no regional  wall motion abnormalities. There is mild concentric left ventricular  hypertrophy. Left ventricular diastolic  parameters are consistent with Grade I diastolic dysfunction (impaired  relaxation).  3. Right ventricular systolic function is normal. The right ventricular  size is normal.  4. The mitral valve is normal in structure. Mild mitral valve  regurgitation. No evidence  of mitral stenosis.  5. The aortic valve is normal in structure. Aortic valve regurgitation is  not visualized. Severe aortic valve stenosis. Aortic valve mean gradient  measures 47.0 mmHg.  6. The inferior vena cava is normal in size with greater than 50%  respiratory variability, suggesting right atrial pressure of 3 mmHg.   TEE 08/16/2019: 1. Left ventricular ejection fraction, by estimation, is 55 to 60%. The  left ventricle has normal function. Left ventricular endocardial border  not optimally defined to evaluate regional wall motion. There is mild left  ventricular hypertrophy.  2. Right ventricular systolic function is normal. The right ventricular  size is normal.  3. Left atrial size was mildly dilated. No left atrial/left atrial  appendage thrombus was detected. The LAA emptying velocity was 69 cm/s.  4. The mitral valve is degenerative. Mild mitral valve regurgitation.  5. The tricuspid valve is degenerative. Tricuspid valve regurgitation is  mild to moderate.  6. The aortic valve is severely calcified and restricted. Aortic valve  regurgitation is moderate. Severe aortic valve stenosis. Aortic valve  area, by VTI measures 0.69 cm. Aortic valve mean gradient measures 42.0  mmHg.  7. There is Severe (Grade IV) atheroma plaque involving the transverse  and descending aorta.  8. Possible tiny PFO with scant color flow seen between atrial septal  flap, with redundant atrial septum. Agitated saline contrast bubble study  was negative, with no evidence of any interatrial shunt.   Labs/Other Tests and Data Reviewed:    EKG:  An ECG dated 08/16/2019 was personally reviewed today and demonstrated:  Sinus rhythm with right bundle branch block.  PAC.  Recent Labs: 08/11/2019: TSH 1.708 08/20/2019: ALT 5; BUN 23; Creatinine, Ser 4.63; Hemoglobin 11.8; Magnesium 1.6; Platelets 460; Potassium 4.2; Sodium 134    Wt Readings from Last 3 Encounters:  08/24/19 145 lb (65.8 kg)   08/20/19 145 lb 15.1 oz (66.2 kg)  08/05/19 146 lb (66.2 kg)     Objective:    Vital Signs:  BP 108/60   Pulse 76   Temp 98.7 F (37.1 C)   Resp 18   Wt 145 lb (65.8 kg)   SpO2 90%   BMI 24.13 kg/m    Patient spoke in full sentences, not short of breath. No audible wheezing or coughing.  ASSESSMENT & PLAN:    1.  Severe calcific aortic stenosis, recently evaluated by transthoracic echocardiogram and TEE.  Plan observation for now, he is not a candidate for open AVR or TAVR at this time with recent bacteremia.  Plan clinical reassessment in the office within the next 6 months and repeat echocardiogram.  2.  ESRD on hemodialysis.  Hypotension more of an issue than hypertension.  He is on midodrine.  3.  Recent hospitalization with enterococcal bacteremia.  He has history of left transmetatarsal amputation in March due to osteomyelitis, and underwent left transtibial amputation more recently in April.  No vegetation by TEE.  He is receiving vancomycin with hemodialysis sessions, discharge summary indicates through April 27.   Time:   Today, I have spent 12 minutes with the patient with telehealth technology discussing the above problems.     Medication Adjustments/Labs and Tests Ordered: Current medicines are reviewed at length with the patient today.  Concerns regarding medicines are outlined above.   Tests Ordered: No orders of the defined types were placed in this encounter.   Medication Changes: No orders of the defined types were placed in this encounter.   Follow Up:  In Person 6 months in the [place] office.  Signed, Rozann Lesches, MD  08/24/2019 9:01 AM    [place]

## 2019-08-25 ENCOUNTER — Other Ambulatory Visit: Payer: Self-pay

## 2019-08-25 ENCOUNTER — Ambulatory Visit (INDEPENDENT_AMBULATORY_CARE_PROVIDER_SITE_OTHER): Payer: Medicare Other | Admitting: Family

## 2019-08-25 ENCOUNTER — Encounter: Payer: Self-pay | Admitting: Family

## 2019-08-25 ENCOUNTER — Ambulatory Visit: Payer: Medicare Other | Admitting: Family

## 2019-08-25 DIAGNOSIS — D509 Iron deficiency anemia, unspecified: Secondary | ICD-10-CM | POA: Diagnosis not present

## 2019-08-25 DIAGNOSIS — Z89512 Acquired absence of left leg below knee: Secondary | ICD-10-CM

## 2019-08-25 DIAGNOSIS — R7881 Bacteremia: Secondary | ICD-10-CM | POA: Diagnosis not present

## 2019-08-25 DIAGNOSIS — N186 End stage renal disease: Secondary | ICD-10-CM | POA: Diagnosis not present

## 2019-08-25 DIAGNOSIS — D631 Anemia in chronic kidney disease: Secondary | ICD-10-CM | POA: Diagnosis not present

## 2019-08-25 DIAGNOSIS — N2581 Secondary hyperparathyroidism of renal origin: Secondary | ICD-10-CM | POA: Diagnosis not present

## 2019-08-25 DIAGNOSIS — Z992 Dependence on renal dialysis: Secondary | ICD-10-CM | POA: Diagnosis not present

## 2019-08-25 NOTE — Progress Notes (Signed)
Post-Op Visit Note   Patient: Joseph SHINSATO Sr.           Date of Birth: 1940/12/23           MRN: [id number] Visit Date: 08/25/2019 PCP: [name], MD  Chief Complaint:  Chief Complaint  Patient presents with  . Left Leg - Routine Post Op    08/13/19 left BKA     HPI:  HPI The patient is a 79 year old gentleman seen today status post left below knee amputation. Is nearly 2 weeks out. Wound vac removed prior to today's visit. Has been having dry dressing changes at SNF.   Ortho Exam On examination of left below knee amputation the incision is well approximated with staples. Incision appears to be healing well. Unfortunately has areas of delaminated and possibly ischemic injury to surrounding soft tissue and posterior calf. No drainage, erythema or sign of infection.  Visit Diagnoses:  1. Acquired absence of left leg below knee (HCC)     Plan: continue daily dial soap cleansing, dry dressings. Follow up in 1 week with duda, eval for further limb salvage surgery at that time.   Follow-Up Instructions: No follow-ups on file.   Imaging: No results found.  Orders:  No orders of the defined types were placed in this encounter.  No orders of the defined types were placed in this encounter.    PMFS History: Patient Active Problem List   Diagnosis Date Noted  . Bacteremia due to Enterococcus 08/11/2019  . Postoperative wound infection 08/10/2019  . Prolonged QT interval 08/10/2019  . Acute metabolic encephalopathy 01/77/9390  . Thrombocytosis ([place]) 08/10/2019  . Hyperkalemia 08/10/2019  . Fall   . Closed intertrochanteric fracture of right hip ([place]) 06/30/2019  . Hip fracture ([place]) 06/30/2019  . Severe protein-calorie malnutrition ([place]) 06/18/2018  . Severe sepsis ([place]) 06/12/2018  . Leukocytosis 06/12/2018  . MRSA (methicillin resistant staph aureus) culture positive 06/12/2018  . Essential hypertension 06/12/2018  . ESRD (end stage renal disease) ([place]) 06/09/2018   . S/P transmetatarsal amputation of foot, right ([place]) 05/09/2017  . PVD (peripheral vascular disease) ([place]) 04/15/2017  . Nonischemic cardiomyopathy ([place]) 08/15/2015  . Aortic stenosis 08/15/2015  . Moderate aortic stenosis 07/12/2015  . 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 CAD- 40-50% LAD at cath 07/11/15 07/11/2015  . Aspirin intolerance 07/11/2015  . Abnormal stress test   . ESRD on dialysis ([place]) 05/30/2015  . CTS (carpal tunnel syndrome) 02/28/2015  . PVD of LE - Dr Oneida Alar follows 08/20/2012  . Midfoot skin ulcer, right, limited to breakdown of skin ([place]) 01/16/2012  . Encounter for screening colonoscopy 10/08/2011  . Melena 10/08/2011  . Other complications due to renal dialysis device, implant, and graft 09/24/2011   Past Medical History:  Diagnosis Date  . Acute osteomyelitis of left foot ([place]) 07/21/2019  . Anxiety   . Aortic stenosis   . Arthritis   . Closed avulsion fracture of right hip ([place]) 06/30/2019  . ESRD (end stage renal disease) on dialysis [facility])    M/W/F at [facility] in [place]  . Essential hypertension   . Gangrene of left foot ([place]) 06/04/2018  . Gangrene of right foot ([place])   . Gastric ulcer 2004  . GI bleed   . History of blood transfusion   . History of cardiomyopathy    LVEF normal as of February 2017  . History of gastric ulcer   . History of stroke    Left side  weakness  . Iron deficiency anemia   . Osteomyelitis ([place])   . Peripheral vascular disease ([place])     Family History  Problem Relation Age of Onset  . Hypertension Mother   . Colon cancer Neg Hx   . Liver disease Neg Hx     Past Surgical History:  Procedure Laterality Date  . ABDOMINAL AORTAGRAM N/A 01/24/2012   Procedure: ABDOMINAL Maxcine Ham;  Surgeon: Elam Dutch, MD;  Location: [facility] CATH LAB;  Service: Cardiovascular;  Laterality: N/A;  . ABDOMINAL AORTOGRAM W/LOWER EXTREMITY N/A 04/15/2017   Procedure: ABDOMINAL AORTOGRAM W/LOWER EXTREMITY;  Surgeon: Angelia Mould, MD;  Location: [place] CV LAB;  Service: Cardiovascular;  Laterality: N/A;  . AMPUTATION Right 05/09/2017   Procedure: RIGHT TRANSMETATARSAL AMPUTATION;  Surgeon: [name], MD;  Location: [place];  Service: Orthopedics;  Laterality: Right;  . AMPUTATION Right 06/13/2018   Procedure: AMPUTATION RIGHT LONG FINGER;  Surgeon: Dayna Barker, MD;  Location: [place];  Service: Plastics;  Laterality: Right;  . AMPUTATION Left 07/21/2019   Procedure: LEFT TRANSMETATARSAL AMPUTATION;  Surgeon: [name], MD;  Location: [place];  Service: Orthopedics;  Laterality: Left;  . AMPUTATION Left 08/13/2019   Procedure: AMPUTATION BELOW KNEE;  Surgeon: [name], MD;  Location: [place];  Service: Orthopedics;  Laterality: Left;  . ARTERIOVENOUS GRAFT PLACEMENT Right right arm  . AV FISTULA PLACEMENT Left 08/31/2015   Procedure: ARTERIOVENOUS (AV) FISTULA CREATION- LEFT RADIOCEPHALIC;  Surgeon: Mal Misty, MD;  Location: [place];  Service: Vascular;  Laterality: Left;  . AV FISTULA PLACEMENT Right 02/18/2017   Procedure: INSERTION OF ARTERIOVENOUS (AV) GORE-TEX GRAFT  RIGHT UPPER ARM;  Surgeon: Conrad Combined Locks, MD;  Location: [place];  Service: Vascular;  Laterality: Right;  . AV FISTULA PLACEMENT Left 12/01/2018   Procedure: INSERTION OF ARTERIOVENOUS (AV) GORE-TEX GRAFT ARM;  Surgeon: Angelia Mould, MD;  Location: [place];  Service: Vascular;  Laterality: Left;  . AV FISTULA PLACEMENT Left 02/09/2019   Procedure: INSERTION OF ARTERIOVENOUS (AV) GORE-TEX GRAFT LEFT THIGH;  Surgeon: Waynetta Sandy, MD;  Location: [place];  Service: Vascular;  Laterality: Left;  . BASCILIC VEIN TRANSPOSITION Left 12/19/2015   Procedure: FIRST STAGE BRACHIAL VEIN TRANSPOSITION;  Surgeon: Conrad [place], MD;  Location: [place];  Service: Vascular;  Laterality: Left;  . BASCILIC VEIN TRANSPOSITION Left 02/08/2016   Procedure: SECOND STAGE BRACHIAL VEIN TRANSPOSITION;  Surgeon: Conrad [place], MD;  Location:  [place];  Service: Vascular;  Laterality: Left;  . BUBBLE STUDY  08/16/2019   Procedure: BUBBLE STUDY;  Surgeon: Elouise Munroe, MD;  Location: [place];  Service: Cardiovascular;;  . CARDIAC CATHETERIZATION N/A 07/11/2015   Procedure: Left Heart Cath and Coronary Angiography;  Surgeon: Troy Sine, MD;  Location: [place] CV LAB;  Service: Cardiovascular;  Laterality: N/A;  . Carpel Tunnel Left Dec. 22, 2016  . CHOLECYSTECTOMY    . COLONOSCOPY  2004   Dr. Irving Shows, left sided diverticula and cecal polyp, path unknown  . COLONOSCOPY  10/29/2011   Procedure: COLONOSCOPY;  Surgeon: Daneil Dolin, MD;  [facility];  Service: Endoscopy;  Laterality: N/A;  10:15  . ESOPHAGOGASTRODUODENOSCOPY  11/2002   Dr. Gala Romney, erosive reflux esophagitis, multiple gastric ulcer and antral/bulbar erosions. Serologies positive for H.Pylori and was treated  . ESOPHAGOGASTRODUODENOSCOPY  11/20014   Dr. Gala Romney, small hh only, ulcers healed  . ESOPHAGOGASTRODUODENOSCOPY  09/21/2011   Dr Trevor Iha [facility],  antral erosions, ?early GAVE  . FISTULOGRAM Left 12/10/2016   Procedure: THROMBECTOMY OF LEFT ARM ARTERIOVENOUS FISTULA;  Surgeon: Waynetta Sandy, MD;  Location: [place];  Service: Vascular;  Laterality: Left;  . INSERTION OF DIALYSIS CATHETER Left 12/10/2016   Procedure: INSERTION OF TUNNELED DIALYSIS CATHETER;  Surgeon: Waynetta Sandy, MD;  Location: [place];  Service: Vascular;  Laterality: Left;  . INSERTION OF DIALYSIS CATHETER Right 06/09/2018   Procedure: INSERTION OF DIALYSIS CATHETER Right subclavian;  Surgeon: Angelia Mould, MD;  Location: [place];  Service: Vascular;  Laterality: Right;  . IR DIALY SHUNT INTRO [place] W/IMG RIGHT Right 01/01/2018  . IR GENERIC HISTORICAL  07/16/2016   IR REMOVAL TUN CV CATH W/O FL 07/16/2016 Saverio Danker, PA-C [facility]-INTER[facility]  . IR PTA ADDL CENTRAL DIALYSIS SEG THRU DIALY CIRCUIT RIGHT Right 10/21/2017  . IR REMOVAL TUN  CV CATH W/O FL  05/12/2017  . IR THROMBECTOMY AV FISTULA W/THROMBOLYSIS/PTA INC/SHUNT/IMG RIGHT Right 10/21/2017  . IR THROMBECTOMY AV FISTULA W/THROMBOLYSIS/PTA INC/SHUNT/IMG RIGHT Right 11/27/2017  . IR US GUIDE VASC ACCESS RIGHT  10/21/2017  . IR US GUIDE VASC ACCESS RIGHT  11/27/2017  . IR US GUIDE VASC ACCESS RIGHT  01/01/2018  . LIGATION ARTERIOVENOUS GORTEX GRAFT Right 06/09/2018  . LIGATION ARTERIOVENOUS GORTEX GRAFT Right 06/09/2018   Procedure: LIGATION ARTERIOVENOUS GORTEX GRAFT RIGHT ARM;  Surgeon: Angelia Mould, MD;  Location: [place];  Service: Vascular;  Laterality: Right;  . LIGATION ARTERIOVENOUS GORTEX GRAFT Left 12/31/2018   Procedure: LIGATION OF LEFT UPPER ARM ARTERIVENOUS GORTEX GRAFT, LEFT BRACHIAL ARTERY ENDARTERECTOMY WITH BOVINE PATCH ANGIOPLASTY;  Surgeon: Waynetta Sandy, MD;  Location: [place];  Service: Vascular;  Laterality: Left;  . LIGATION OF ARTERIOVENOUS  FISTULA Left 12/19/2015   Procedure: LIGATION OF RADIOCEPHALIC ARTERIOVENOUS  FISTULA;  Surgeon: Conrad Start, MD;  Location: [place];  Service: Vascular;  Laterality: Left;  Marland Kitchen MASS EXCISION Right 02/18/2017   Procedure: EXCISION OF RIGHT AXILLARY EPIDERMAL INCLUSION CYST;  Surgeon: Conrad [place], MD;  Location: [place];  Service: Vascular;  Laterality: Right;  . PERIPHERAL VASCULAR CATHETERIZATION N/A 12/14/2015   Procedure: Fistulagram;  Surgeon: Conrad , MD;  Location: [place] CV LAB;  Service: Cardiovascular;  Laterality: N/A;  . SHOULDER SURGERY Right    fracture  . TEE WITHOUT CARDIOVERSION N/A 08/16/2019   Procedure: TRANSESOPHAGEAL ECHOCARDIOGRAM (TEE);  Surgeon: Elouise Munroe, MD;  Location: [place];  Service: Cardiovascular;  Laterality: N/A;  . ULTRASOUND GUIDANCE FOR VASCULAR ACCESS  04/15/2017   Procedure: Ultrasound Guidance For Vascular Access;  Surgeon: Angelia Mould, MD;  Location: [place] CV LAB;  Service: Cardiovascular;;  . UPPER EXTREMITY VENOGRAPHY Bilateral  12/17/2016   Procedure: Bilateral Upper Extremity Venography;  Surgeon: Serafina Mitchell, MD;  Location: [place] CV LAB;  Service: Cardiovascular;  Laterality: Bilateral;  . UPPER EXTREMITY VENOGRAPHY Left 11/03/2018   Procedure: UPPER EXTREMITY VENOGRAPHY;  Surgeon: Serafina Mitchell, MD;  Location: [place] CV LAB;  Service: Cardiovascular;  Laterality: Left;   Social History   Occupational History  . Occupation: retired, Licensed conveyancer    Employer: RETIRED  Tobacco Use  . Smoking status: Former Smoker    Years: 25.00    Quit date: 04/29/2004    Years since quitting: 15.3  . Smokeless tobacco: Former Systems developer    Types: Chew    Quit date: 01/16/1987  . Tobacco comment: quit 2006  Substance and Sexual Activity  .  Alcohol use: No  . Drug use: No  . Sexual activity: Not on file

## 2019-08-27 DIAGNOSIS — D509 Iron deficiency anemia, unspecified: Secondary | ICD-10-CM | POA: Diagnosis not present

## 2019-08-27 DIAGNOSIS — L089 Local infection of the skin and subcutaneous tissue, unspecified: Secondary | ICD-10-CM | POA: Diagnosis not present

## 2019-08-27 DIAGNOSIS — N2581 Secondary hyperparathyroidism of renal origin: Secondary | ICD-10-CM | POA: Diagnosis not present

## 2019-08-27 DIAGNOSIS — I739 Peripheral vascular disease, unspecified: Secondary | ICD-10-CM | POA: Diagnosis not present

## 2019-08-27 DIAGNOSIS — N186 End stage renal disease: Secondary | ICD-10-CM | POA: Diagnosis not present

## 2019-08-27 DIAGNOSIS — Z992 Dependence on renal dialysis: Secondary | ICD-10-CM | POA: Diagnosis not present

## 2019-08-27 DIAGNOSIS — D631 Anemia in chronic kidney disease: Secondary | ICD-10-CM | POA: Diagnosis not present

## 2019-08-27 DIAGNOSIS — R7881 Bacteremia: Secondary | ICD-10-CM | POA: Diagnosis not present

## 2019-08-28 DIAGNOSIS — K922 Gastrointestinal hemorrhage, unspecified: Secondary | ICD-10-CM

## 2019-08-28 HISTORY — DX: Gastrointestinal hemorrhage, unspecified: K92.2

## 2019-08-30 DIAGNOSIS — D631 Anemia in chronic kidney disease: Secondary | ICD-10-CM | POA: Diagnosis not present

## 2019-08-30 DIAGNOSIS — N186 End stage renal disease: Secondary | ICD-10-CM | POA: Diagnosis not present

## 2019-08-30 DIAGNOSIS — N2581 Secondary hyperparathyroidism of renal origin: Secondary | ICD-10-CM | POA: Diagnosis not present

## 2019-08-30 DIAGNOSIS — D509 Iron deficiency anemia, unspecified: Secondary | ICD-10-CM | POA: Diagnosis not present

## 2019-08-30 DIAGNOSIS — R5383 Other fatigue: Secondary | ICD-10-CM | POA: Diagnosis not present

## 2019-08-30 DIAGNOSIS — Z992 Dependence on renal dialysis: Secondary | ICD-10-CM | POA: Diagnosis not present

## 2019-08-31 ENCOUNTER — Ambulatory Visit: Payer: Medicare Other | Admitting: Family

## 2019-08-31 ENCOUNTER — Telehealth: Payer: Self-pay | Admitting: Orthopedic Surgery

## 2019-08-31 NOTE — Telephone Encounter (Signed)
Sanetta from [facility] called. She is the wound care nurse there. She wanted Dr. Sharol Given to know that she noticed an odor and some oozing coming out of his stomp. Her call back number is [phone].

## 2019-09-01 DIAGNOSIS — Z992 Dependence on renal dialysis: Secondary | ICD-10-CM | POA: Diagnosis not present

## 2019-09-01 DIAGNOSIS — D631 Anemia in chronic kidney disease: Secondary | ICD-10-CM | POA: Diagnosis not present

## 2019-09-01 DIAGNOSIS — D509 Iron deficiency anemia, unspecified: Secondary | ICD-10-CM | POA: Diagnosis not present

## 2019-09-01 DIAGNOSIS — N2581 Secondary hyperparathyroidism of renal origin: Secondary | ICD-10-CM | POA: Diagnosis not present

## 2019-09-01 DIAGNOSIS — R5383 Other fatigue: Secondary | ICD-10-CM | POA: Diagnosis not present

## 2019-09-01 DIAGNOSIS — N186 End stage renal disease: Secondary | ICD-10-CM | POA: Diagnosis not present

## 2019-09-01 NOTE — Telephone Encounter (Signed)
I called and advised that the pt had an appt for 08/31/19 that was cancelled and he was resch for tomorrow at 2 pm. Will relay the information to Dr. Sharol Given and then update orders for facility. To call with any questions.

## 2019-09-02 ENCOUNTER — Other Ambulatory Visit: Payer: Self-pay

## 2019-09-02 ENCOUNTER — Other Ambulatory Visit: Payer: Self-pay | Admitting: Physician Assistant

## 2019-09-02 ENCOUNTER — Ambulatory Visit (INDEPENDENT_AMBULATORY_CARE_PROVIDER_SITE_OTHER): Payer: Medicare Other | Admitting: Orthopedic Surgery

## 2019-09-02 ENCOUNTER — Encounter (HOSPITAL_COMMUNITY): Payer: Self-pay | Admitting: Orthopedic Surgery

## 2019-09-02 ENCOUNTER — Encounter: Payer: Self-pay | Admitting: Physician Assistant

## 2019-09-02 VITALS — Ht 65.0 in | Wt 145.0 lb

## 2019-09-02 DIAGNOSIS — I739 Peripheral vascular disease, unspecified: Secondary | ICD-10-CM

## 2019-09-02 DIAGNOSIS — T8781 Dehiscence of amputation stump: Secondary | ICD-10-CM

## 2019-09-02 DIAGNOSIS — I70262 Atherosclerosis of native arteries of extremities with gangrene, left leg: Secondary | ICD-10-CM

## 2019-09-02 MED ORDER — SODIUM CHLORIDE 0.9 % IV SOLN: 100.0000 mL | INTRAVENOUS | Status: DC | PRN

## 2019-09-02 MED ORDER — PENTAFLUOROPROP-TETRAFLUOROETH EX AERO
1.0000 "application " | INHALATION_SPRAY | CUTANEOUS | Status: DC | PRN
  Filled 2019-09-02: qty 116

## 2019-09-02 MED ORDER — LIDOCAINE HCL (PF) 1 % IJ SOLN
5.0000 mL | INTRAMUSCULAR | Status: DC | PRN
  Filled 2019-09-02: qty 5

## 2019-09-02 MED ORDER — LIDOCAINE-PRILOCAINE 2.5-2.5 % EX CREA
1.0000 "application " | TOPICAL_CREAM | CUTANEOUS | Status: DC | PRN
  Filled 2019-09-02: qty 5

## 2019-09-02 MED ORDER — ALTEPLASE 2 MG IJ SOLR
2.0000 mg | Freq: Once | INTRAMUSCULAR | Status: DC | PRN
  Filled 2019-09-02: qty 2

## 2019-09-02 MED ORDER — HEPARIN SODIUM (PORCINE) 1000 UNIT/ML DIALYSIS
1000.0000 [IU] | INTRAMUSCULAR | Status: DC | PRN
  Filled 2019-09-02: qty 1

## 2019-09-02 NOTE — Progress Notes (Signed)
Office Visit Note   Patient: Joseph NANCE Sr.           Date of Birth: [birth year]           MRN: [id number] Visit Date: 09/02/2019              Requested by: [name], MD [address] [place],  [place] [address] PCP: [name], MD  Chief Complaint  Patient presents with  . 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  Assessment & Plan: Visit Diagnoses:  1. PVD (peripheral vascular disease) ([place])   2. Atherosclerosis of native arteries of extremities with gangrene, left leg (HCC)   3. Dehiscence of amputation stump ([place])     Plan: Patient does not have any further limb salvage intervention options we will need to proceed with an above-the-knee amputation tomorrow.  Anticipate patient can return back to skilled nursing we will proceed with dialysis after surgery.  Risk and benefits were again discussed with the risk of the wound not healing.  Follow-Up Instructions: Return in about 2 weeks (around 09/16/2019).   Ortho Exam  Patient is alert, oriented, no adenopathy, well-dressed, normal affect, normal respiratory effort. Examination patient has circumferential gangrenous changes of the left below the knee amputation.  The anterior compartment is necrotic and draining.  There is no ascending cellulitis.  The ischemic changes are circumferential.  Imaging: No results found. No images are attached to the encounter.  Labs: Lab Results  Component Value Date   HGBA1C 6.0 (H) 08/18/2019   ESRSEDRATE 36 (H) 08/10/2019   CRP 25.7 (H) 08/10/2019   REPTSTATUS 08/16/2019 FINAL  08/11/2019   GRAMSTAIN  02/18/2017    RARE WBC PRESENT, PREDOMINANTLY PMN FEW GRAM POSITIVE COCCI    CULT  08/11/2019    NO GROWTH 5 DAYS Performed at [facility], [place] [address], [place], [place] [address]    LABORGA ENTEROCOCCUS FAECALIS 08/10/2019     Lab Results  Component Value Date   ALBUMIN 1.7 (L) 08/20/2019   ALBUMIN 1.7 (L) 08/19/2019   ALBUMIN 1.5 (L) 08/18/2019    Lab Results  Component Value Date   MG 1.6 (L) 08/20/2019   MG 1.8 08/19/2019   MG 1.7 08/18/2019   No results found for: VD25OH  No results found for: PREALBUMIN CBC EXTENDED Latest Ref Rng & Units 08/20/2019 08/19/2019 08/18/2019  WBC 4.0 - 10.5 K/uL 34.6(H) 35.5(H) 27.6(H)  RBC 4.22 - 5.81 MIL/uL 5.77 5.88(H) 5.34  HGB 13.0 - 17.0 g/dL 11.8(L) 11.9(L) 11.0(L)  HCT 39.0 - 52.0 % 35.9(L) 36.3(L) 33.1(L)  PLT 150 - 400 K/uL 460(H) 398 348  NEUTROABS 1.7 - 7.7 K/uL 29.7(H) 29.5(H) -  LYMPHSABS 0.7 - 4.0 K/uL 2.1 2.4 -     Body mass index is 24.13 kg/m.  Orders:  No orders of the defined types were placed in this encounter.  No orders of the defined types were placed in this encounter.    Procedures: No procedures performed  Clinical Data: No additional findings.  ROS:  All other systems negative, except as noted in the HPI. Review  of Systems  Objective: Vital Signs: Ht 5\' 5"  (1.651 m)   Wt 145 lb (65.8 kg)   BMI 24.13 kg/m   Specialty Comments:  No specialty comments available.  PMFS History: Patient Active Problem List   Diagnosis Date Noted  . Bacteremia due to Enterococcus 08/11/2019  . Postoperative wound infection 08/10/2019  . Prolonged QT interval 08/10/2019  . Acute metabolic encephalopathy 37/85/8850  . Thrombocytosis ([place]) 08/10/2019  . Hyperkalemia 08/10/2019  . Fall   . Closed intertrochanteric fracture of right hip ([place]) 06/30/2019  . Hip fracture ([place]) 06/30/2019  . Severe protein-calorie malnutrition ([place]) 06/18/2018  . Severe sepsis ([place])  06/12/2018  . Leukocytosis 06/12/2018  . MRSA (methicillin resistant staph aureus) culture positive 06/12/2018  . Essential hypertension 06/12/2018  . ESRD (end stage renal disease) ([place]) 06/09/2018  . S/P transmetatarsal amputation of foot, right ([place]) 05/09/2017  . PVD (peripheral vascular disease) ([place]) 04/15/2017  . Nonischemic cardiomyopathy ([place]) 08/15/2015  . Aortic stenosis 08/15/2015  . Moderate aortic stenosis 07/12/2015  . 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 CAD- 40-50% LAD at cath 07/11/15 07/11/2015  . Aspirin intolerance 07/11/2015  . Abnormal stress test   . ESRD on dialysis ([place]) 05/30/2015  . CTS (carpal tunnel syndrome) 02/28/2015  . PVD of LE - Dr Oneida Alar follows 08/20/2012  . Midfoot skin ulcer, right, limited to breakdown of skin ([place]) 01/16/2012  . Encounter for screening colonoscopy 10/08/2011  . Melena 10/08/2011  . Other complications due to renal dialysis device, implant, and graft 09/24/2011   Past Medical History:  Diagnosis Date  . Acute osteomyelitis of left foot ([place]) 07/21/2019  . Anxiety   . Aortic stenosis   . Arthritis   . Closed avulsion fracture of right hip ([place]) 06/30/2019  . ESRD (end stage renal disease) on dialysis [facility])    M/W/F at [facility] in [place]  . Essential hypertension   . Gangrene of left foot ([place]) 06/04/2018  . Gangrene of right foot ([place])   . Gastric ulcer 2004  . GI bleed   . History of blood transfusion   . History of cardiomyopathy    LVEF normal as of February 2017  . History of gastric ulcer   . History of stroke    Left side weakness  . Iron deficiency anemia   . Osteomyelitis ([place])   . Peripheral vascular disease ([place])     Family History  Problem Relation Age of Onset  . Hypertension Mother   . Colon cancer Neg Hx   . Liver disease Neg Hx     Past Surgical History:  Procedure Laterality Date  . ABDOMINAL AORTAGRAM N/A 01/24/2012   Procedure: ABDOMINAL Maxcine Ham;  Surgeon: Elam Dutch,  MD;  Location: [facility] CATH LAB;  Service: Cardiovascular;  Laterality: N/A;  . ABDOMINAL AORTOGRAM W/LOWER EXTREMITY N/A 04/15/2017   Procedure: ABDOMINAL AORTOGRAM W/LOWER EXTREMITY;  Surgeon: Angelia Mould, MD;  Location: [place] CV LAB;  Service: Cardiovascular;  Laterality: N/A;  . AMPUTATION Right 05/09/2017   Procedure: RIGHT TRANSMETATARSAL AMPUTATION;  Surgeon: [name], MD;  Location: [place];  Service: Orthopedics;  Laterality: Right;  . AMPUTATION Right 06/13/2018   Procedure: AMPUTATION RIGHT LONG FINGER;  Surgeon: Dayna Barker, MD;  Location: [place];  Service: Plastics;  Laterality: Right;  . AMPUTATION Left 07/21/2019   Procedure: LEFT TRANSMETATARSAL AMPUTATION;  Surgeon: [name], MD;  Location: [place];  Service: Orthopedics;  Laterality: Left;  . AMPUTATION Left 08/13/2019  Procedure: AMPUTATION BELOW KNEE;  Surgeon: [name], MD;  Location: [place];  Service: Orthopedics;  Laterality: Left;  . ARTERIOVENOUS GRAFT PLACEMENT Right right arm  . AV FISTULA PLACEMENT Left 08/31/2015   Procedure: ARTERIOVENOUS (AV) FISTULA CREATION- LEFT RADIOCEPHALIC;  Surgeon: Mal Misty, MD;  Location: [place];  Service: Vascular;  Laterality: Left;  . AV FISTULA PLACEMENT Right 02/18/2017   Procedure: INSERTION OF ARTERIOVENOUS (AV) GORE-TEX GRAFT  RIGHT UPPER ARM;  Surgeon: Conrad [place], MD;  Location: [place];  Service: Vascular;  Laterality: Right;  . AV FISTULA PLACEMENT Left 12/01/2018   Procedure: INSERTION OF ARTERIOVENOUS (AV) GORE-TEX GRAFT ARM;  Surgeon: Angelia Mould, MD;  Location: [place];  Service: Vascular;  Laterality: Left;  . AV FISTULA PLACEMENT Left 02/09/2019   Procedure: INSERTION OF ARTERIOVENOUS (AV) GORE-TEX GRAFT LEFT THIGH;  Surgeon: Waynetta Sandy, MD;  Location: [place];  Service: Vascular;  Laterality: Left;  . BASCILIC VEIN TRANSPOSITION Left 12/19/2015   Procedure: FIRST STAGE BRACHIAL VEIN TRANSPOSITION;  Surgeon: Conrad [place], MD;   Location: [place];  Service: Vascular;  Laterality: Left;  . BASCILIC VEIN TRANSPOSITION Left 02/08/2016   Procedure: SECOND STAGE BRACHIAL VEIN TRANSPOSITION;  Surgeon: Conrad [place], MD;  Location: [place];  Service: Vascular;  Laterality: Left;  . BUBBLE STUDY  08/16/2019   Procedure: BUBBLE STUDY;  Surgeon: Elouise Munroe, MD;  Location: [place];  Service: Cardiovascular;;  . CARDIAC CATHETERIZATION N/A 07/11/2015   Procedure: Left Heart Cath and Coronary Angiography;  Surgeon: Troy Sine, MD;  Location: [place] CV LAB;  Service: Cardiovascular;  Laterality: N/A;  . Carpel Tunnel Left Dec. 22, 2016  . CHOLECYSTECTOMY    . COLONOSCOPY  2004   Dr. Irving Shows, left sided diverticula and cecal polyp, path unknown  . COLONOSCOPY  10/29/2011   Procedure: COLONOSCOPY;  Surgeon: Daneil Dolin, MD;  [facility];  Service: Endoscopy;  Laterality: N/A;  10:15  . ESOPHAGOGASTRODUODENOSCOPY  11/2002   Dr. Gala Romney, erosive reflux esophagitis, multiple gastric ulcer and antral/bulbar erosions. Serologies positive for H.Pylori and was treated  . ESOPHAGOGASTRODUODENOSCOPY  11/20014   Dr. Gala Romney, small hh only, ulcers healed  . ESOPHAGOGASTRODUODENOSCOPY  09/21/2011   Dr Trevor Iha [facility], antral erosions, ?early GAVE  . FISTULOGRAM Left 12/10/2016   Procedure: THROMBECTOMY OF LEFT ARM ARTERIOVENOUS FISTULA;  Surgeon: Waynetta Sandy, MD;  Location: [place];  Service: Vascular;  Laterality: Left;  . INSERTION OF DIALYSIS CATHETER Left 12/10/2016   Procedure: INSERTION OF TUNNELED DIALYSIS CATHETER;  Surgeon: Waynetta Sandy, MD;  Location: [place];  Service: Vascular;  Laterality: Left;  . INSERTION OF DIALYSIS CATHETER Right 06/09/2018   Procedure: INSERTION OF DIALYSIS CATHETER Right subclavian;  Surgeon: Angelia Mould, MD;  Location: [place];  Service: Vascular;  Laterality: Right;  . IR DIALY SHUNT INTRO [place] W/IMG RIGHT Right 01/01/2018  . IR  GENERIC HISTORICAL  07/16/2016   IR REMOVAL TUN CV CATH W/O FL 07/16/2016 Saverio Danker, PA-C [facility]INTER[facility]  . IR PTA ADDL CENTRAL DIALYSIS SEG THRU DIALY CIRCUIT RIGHT Right 10/21/2017  . IR REMOVAL TUN CV CATH W/O FL  05/12/2017  . IR THROMBECTOMY AV FISTULA W/THROMBOLYSIS/PTA INC/SHUNT/IMG RIGHT Right 10/21/2017  . IR THROMBECTOMY AV FISTULA W/THROMBOLYSIS/PTA INC/SHUNT/IMG RIGHT Right 11/27/2017  . IR US GUIDE VASC ACCESS RIGHT  10/21/2017  . IR US GUIDE VASC ACCESS RIGHT  11/27/2017  . IR US GUIDE VASC ACCESS RIGHT  01/01/2018  . LIGATION ARTERIOVENOUS GORTEX GRAFT Right 06/09/2018  . LIGATION ARTERIOVENOUS GORTEX GRAFT Right 06/09/2018   Procedure: LIGATION ARTERIOVENOUS GORTEX GRAFT RIGHT ARM;  Surgeon: Angelia Mould, MD;  Location: [place];  Service: Vascular;  Laterality: Right;  . LIGATION ARTERIOVENOUS GORTEX GRAFT Left 12/31/2018   Procedure: LIGATION OF LEFT UPPER ARM ARTERIVENOUS GORTEX GRAFT, LEFT BRACHIAL ARTERY ENDARTERECTOMY WITH BOVINE PATCH ANGIOPLASTY;  Surgeon: Waynetta Sandy, MD;  Location: [place];  Service: Vascular;  Laterality: Left;  . LIGATION OF ARTERIOVENOUS  FISTULA Left 12/19/2015   Procedure: LIGATION OF RADIOCEPHALIC ARTERIOVENOUS  FISTULA;  Surgeon: Conrad [place]ra[place], MD;  Location: [place];  Service: Vascular;  Laterality: Left;  Marland Kitchen MASS EXCISION Right 02/18/2017   Procedure: EXCISION OF RIGHT AXILLARY EPIDERMAL INCLUSION CYST;  Surgeon: Conrad [place], MD;  Location: [place];  Service: Vascular;  Laterality: Right;  . PERIPHERAL VASCULAR CATHETERIZATION N/A 12/14/2015   Procedure: Fistulagram;  Surgeon: Conrad [place], MD;  Location: [place] CV LAB;  Service: Cardiovascular;  Laterality: N/A;  . SHOULDER SURGERY Right    fracture  . TEE WITHOUT CARDIOVERSION N/A 08/16/2019   Procedure: TRANSESOPHAGEAL ECHOCARDIOGRAM (TEE);  Surgeon: Elouise Munroe, MD;  Location: [place];  Service: Cardiovascular;  Laterality: N/A;  . ULTRASOUND GUIDANCE FOR VASCULAR ACCESS   04/15/2017   Procedure: Ultrasound Guidance For Vascular Access;  Surgeon: Angelia Mould, MD;  Location: [place] CV LAB;  Service: Cardiovascular;;  . UPPER EXTREMITY VENOGRAPHY Bilateral 12/17/2016   Procedure: Bilateral Upper Extremity Venography;  Surgeon: Serafina Mitchell, MD;  Location: [place] CV LAB;  Service: Cardiovascular;  Laterality: Bilateral;  . UPPER EXTREMITY VENOGRAPHY Left 11/03/2018   Procedure: UPPER EXTREMITY VENOGRAPHY;  Surgeon: Serafina Mitchell, MD;  Location: [place] CV LAB;  Service: Cardiovascular;  Laterality: Left;   Social History   Occupational History  . Occupation: retired, Licensed conveyancer    Employer: RETIRED  Tobacco Use  . Smoking status: Former Smoker    Years: 25.00    Quit date: 04/29/2004    Years since quitting: 15.3  . Smokeless tobacco: Former Systems developer    Types: Chew    Quit date: 01/16/1987  . Tobacco comment: quit 2006  Substance and Sexual Activity  . Alcohol use: No  . Drug use: No  . Sexual activity: Not on file

## 2019-09-02 NOTE — Progress Notes (Addendum)
I spoke to Mr,Buscemi's nurse, she reports that Mr. Pauwels has not c/o chest pain or shortness of breath.  [place] reported that there  were no instruction to hold Plavix.  I instructed Bryson Corona that patient should arrive at 0830, she said that the paper from the office said to arrive at 1000, surgery at 1200. I said that maybe the time had changed, but patient would need to arrive ay 0830 to be tested for Covid.  Mr. Coutts will be transferred by a transportation, he will be hoyered into a w/c and would arrive to PRE- OP in their w/c.  Silas [place] said that she did not know if they would be able to have patient here by 0830, she is going to call the on call for the transfere company to see if they can come earlier. Mr. Hoffert is on Midodrin  3 times a day, Bryson Corona reports that the  blood pressure has been running in the 140/70 range and midodrine is held if it is normal.  I faxed instructions to [facility], ERAS diet is included in the instructions,I went over the information verbally also. Echo in April 2021 shows severe aortic stenosis-I informed Dr. Nyoka Cowden at [phone] - she said to put a note on the chart to say chart has not been reviewed. Silas [place] called back and said she has arranged EMS to transfer patient to the hospital to arrive at 0830.

## 2019-09-02 NOTE — Pre-Procedure Instructions (Addendum)
    [name]  09/02/2019                Procedure is scheduled on Friday, May 7.  Report to [facility], Main Entrance or Entrance "A" at 8:30 AM >>>>>Please send patient's Medication  Record with medications administrated documentation. ( this information is required prior to OR. This includes medications that may have been on hold for surgery)<<<<<   Call this number if you have problems the morning of surgery: [phone]  This is the number for the Pre- Surgical Desk.   Remember:  Do not eat after midnight.  You may drink clear liquids until 8:30 AM .  Clear liquids allowed are:   Water, Juice (non-citric and without pulp - diabetics please choose diet or no sugar options), Carbonated beverages - (diabetics please choose diet or no sugar options), Clear Tea, Black Coffee only (no creamer, milk or cream including half and half), Plain Jell-O only (diabetics please choose diet or no sugar options), Gatorade (diabetics please choose diet or no sugar options) and Plain Popsicles only    Take these medicines the morning of surgery with A SIP OF WATER:  atorvastatin (LIPITOR) 40 MG              midodrine (PROAMATINE)  Follow Dr. Jess Barters instructions regarding Plavix. Nothing else to drink after 8:30AM  Patient should be showered/bated with antibacteria soap.  Do not wear jewelry, make-up.  Do not wear lotions, powders, or cologne, or deodorant.  Men may shave face and neck.  Do not bring valuables to the hospital.  [facility] is not responsible for any belongings or valuables.  Contacts, dentures or bridgework may not be worn into surgery.

## 2019-09-02 NOTE — H&P (View-Only) (Signed)
Office Visit Note   Patient: Joseph MEINZER Sr.           Date of Birth: [birth year]           MRN: [id number] Visit Date: 09/02/2019              Requested by: [name], MD [address] [place],  [place] [address] PCP: [name], MD  Chief Complaint  Patient presents with  . 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  Assessment & Plan: Visit Diagnoses:  1. PVD (peripheral vascular disease) ([place])   2. Atherosclerosis of native arteries of extremities with gangrene, left leg (HCC)   3. Dehiscence of amputation stump ([place])     Plan: Patient does not have any further limb salvage intervention options we will need to proceed with an above-the-knee amputation tomorrow.  Anticipate patient can return back to skilled nursing we will proceed with dialysis after surgery.  Risk and benefits were again discussed with the risk of the wound not healing.  Follow-Up Instructions: Return in about 2 weeks (around 09/16/2019).   Ortho Exam  Patient is alert, oriented, no adenopathy, well-dressed, normal affect, normal respiratory effort. Examination patient has circumferential gangrenous changes of the left below the knee amputation.  The anterior compartment is necrotic and draining.  There is no ascending cellulitis.  The ischemic changes are circumferential.  Imaging: No results found. No images are attached to the encounter.  Labs: Lab Results  Component Value Date   HGBA1C 6.0 (H) 08/18/2019   ESRSEDRATE 36 (H) 08/10/2019   CRP 25.7 (H) 08/10/2019   REPTSTATUS 08/16/2019 FINAL  08/11/2019   GRAMSTAIN  02/18/2017    RARE WBC PRESENT, PREDOMINANTLY PMN FEW GRAM POSITIVE COCCI    CULT  08/11/2019    NO GROWTH 5 DAYS Performed at [facility], [place] [address], [place], [place] [address]    LABORGA ENTEROCOCCUS FAECALIS 08/10/2019     Lab Results  Component Value Date   ALBUMIN 1.7 (L) 08/20/2019   ALBUMIN 1.7 (L) 08/19/2019   ALBUMIN 1.5 (L) 08/18/2019    Lab Results  Component Value Date   MG 1.6 (L) 08/20/2019   MG 1.8 08/19/2019   MG 1.7 08/18/2019   No results found for: VD25OH  No results found for: PREALBUMIN CBC EXTENDED Latest Ref Rng & Units 08/20/2019 08/19/2019 08/18/2019  WBC 4.0 - 10.5 K/uL 34.6(H) 35.5(H) 27.6(H)  RBC 4.22 - 5.81 MIL/uL 5.77 5.88(H) 5.34  HGB 13.0 - 17.0 g/dL 11.8(L) 11.9(L) 11.0(L)  HCT 39.0 - 52.0 % 35.9(L) 36.3(L) 33.1(L)  PLT 150 - 400 K/uL 460(H) 398 348  NEUTROABS 1.7 - 7.7 K/uL 29.7(H) 29.5(H) -  LYMPHSABS 0.7 - 4.0 K/uL 2.1 2.4 -     Body mass index is 24.13 kg/m.  Orders:  No orders of the defined types were placed in this encounter.  No orders of the defined types were placed in this encounter.    Procedures: No procedures performed  Clinical Data: No additional findings.  ROS:  All other systems negative, except as noted in the HPI. Review  of Systems  Objective: Vital Signs: Ht 5\' 5"  (1.651 m)   Wt 145 lb (65.8 kg)   BMI 24.13 kg/m   Specialty Comments:  No specialty comments available.  PMFS History: Patient Active Problem List   Diagnosis Date Noted  . Bacteremia due to Enterococcus 08/11/2019  . Postoperative wound infection 08/10/2019  . Prolonged QT interval 08/10/2019  . Acute metabolic encephalopathy 77/93/9030  . Thrombocytosis ([place]) 08/10/2019  . Hyperkalemia 08/10/2019  . Fall   . Closed intertrochanteric fracture of right hip ([place]) 06/30/2019  . Hip fracture ([place]) 06/30/2019  . Severe protein-calorie malnutrition ([place]) 06/18/2018  . Severe sepsis ([place])  06/12/2018  . Leukocytosis 06/12/2018  . MRSA (methicillin resistant staph aureus) culture positive 06/12/2018  . Essential hypertension 06/12/2018  . ESRD (end stage renal disease) ([place]) 06/09/2018  . S/P transmetatarsal amputation of foot, right ([place]) 05/09/2017  . PVD (peripheral vascular disease) ([place]) 04/15/2017  . Nonischemic cardiomyopathy ([place]) 08/15/2015  . Aortic stenosis 08/15/2015  . Moderate aortic stenosis 07/12/2015  . 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 CAD- 40-50% LAD at cath 07/11/15 07/11/2015  . Aspirin intolerance 07/11/2015  . Abnormal stress test   . ESRD on dialysis ([place]) 05/30/2015  . CTS (carpal tunnel syndrome) 02/28/2015  . PVD of LE - Dr Oneida Alar follows 08/20/2012  . Midfoot skin ulcer, right, limited to breakdown of skin ([place]) 01/16/2012  . Encounter for screening colonoscopy 10/08/2011  . Melena 10/08/2011  . Other complications due to renal dialysis device, implant, and graft 09/24/2011   Past Medical History:  Diagnosis Date  . Acute osteomyelitis of left foot ([place]) 07/21/2019  . Anxiety   . Aortic stenosis   . Arthritis   . Closed avulsion fracture of right hip ([place]) 06/30/2019  . ESRD (end stage renal disease) on dialysis [facility])    M/W/F at [facility] in [place]  . Essential hypertension   . Gangrene of left foot ([place]) 06/04/2018  . Gangrene of right foot ([place])   . Gastric ulcer 2004  . GI bleed   . History of blood transfusion   . History of cardiomyopathy    LVEF normal as of February 2017  . History of gastric ulcer   . History of stroke    Left side weakness  . Iron deficiency anemia   . Osteomyelitis ([place])   . Peripheral vascular disease ([place])     Family History  Problem Relation Age of Onset  . Hypertension Mother   . Colon cancer Neg Hx   . Liver disease Neg Hx     Past Surgical History:  Procedure Laterality Date  . ABDOMINAL AORTAGRAM N/A 01/24/2012   Procedure: ABDOMINAL Maxcine Ham;  Surgeon: Elam Dutch,  MD;  Location: [facility] CATH LAB;  Service: Cardiovascular;  Laterality: N/A;  . ABDOMINAL AORTOGRAM W/LOWER EXTREMITY N/A 04/15/2017   Procedure: ABDOMINAL AORTOGRAM W/LOWER EXTREMITY;  Surgeon: Angelia Mould, MD;  Location: [place] CV LAB;  Service: Cardiovascular;  Laterality: N/A;  . AMPUTATION Right 05/09/2017   Procedure: RIGHT TRANSMETATARSAL AMPUTATION;  Surgeon: [name], MD;  Location: [place];  Service: Orthopedics;  Laterality: Right;  . AMPUTATION Right 06/13/2018   Procedure: AMPUTATION RIGHT LONG FINGER;  Surgeon: Dayna Barker, MD;  Location: [place];  Service: Plastics;  Laterality: Right;  . AMPUTATION Left 07/21/2019   Procedure: LEFT TRANSMETATARSAL AMPUTATION;  Surgeon: [name], MD;  Location: [place];  Service: Orthopedics;  Laterality: Left;  . AMPUTATION Left 08/13/2019  Procedure: AMPUTATION BELOW KNEE;  Surgeon: [name], MD;  Location: [place];  Service: Orthopedics;  Laterality: Left;  . ARTERIOVENOUS GRAFT PLACEMENT Right right arm  . AV FISTULA PLACEMENT Left 08/31/2015   Procedure: ARTERIOVENOUS (AV) FISTULA CREATION- LEFT RADIOCEPHALIC;  Surgeon: Mal Misty, MD;  Location: [place];  Service: Vascular;  Laterality: Left;  . AV FISTULA PLACEMENT Right 02/18/2017   Procedure: INSERTION OF ARTERIOVENOUS (AV) GORE-TEX GRAFT  RIGHT UPPER ARM;  Surgeon: Conrad Eden, MD;  Location: [place];  Service: Vascular;  Laterality: Right;  . AV FISTULA PLACEMENT Left 12/01/2018   Procedure: INSERTION OF ARTERIOVENOUS (AV) GORE-TEX GRAFT ARM;  Surgeon: Angelia Mould, MD;  Location: [place];  Service: Vascular;  Laterality: Left;  . AV FISTULA PLACEMENT Left 02/09/2019   Procedure: INSERTION OF ARTERIOVENOUS (AV) GORE-TEX GRAFT LEFT THIGH;  Surgeon: Waynetta Sandy, MD;  Location: [place];  Service: Vascular;  Laterality: Left;  . BASCILIC VEIN TRANSPOSITION Left 12/19/2015   Procedure: FIRST STAGE BRACHIAL VEIN TRANSPOSITION;  Surgeon: Conrad Veguita, MD;   Location: [place];  Service: Vascular;  Laterality: Left;  . BASCILIC VEIN TRANSPOSITION Left 02/08/2016   Procedure: SECOND STAGE BRACHIAL VEIN TRANSPOSITION;  Surgeon: Conrad Seatonville, MD;  Location: [place];  Service: Vascular;  Laterality: Left;  . BUBBLE STUDY  08/16/2019   Procedure: BUBBLE STUDY;  Surgeon: Elouise Munroe, MD;  Location: [place];  Service: Cardiovascular;;  . CARDIAC CATHETERIZATION N/A 07/11/2015   Procedure: Left Heart Cath and Coronary Angiography;  Surgeon: Troy Sine, MD;  Location: [place] CV LAB;  Service: Cardiovascular;  Laterality: N/A;  . Carpel Tunnel Left Dec. 22, 2016  . CHOLECYSTECTOMY    . COLONOSCOPY  2004   Dr. Irving Shows, left sided diverticula and cecal polyp, path unknown  . COLONOSCOPY  10/29/2011   Procedure: COLONOSCOPY;  Surgeon: Daneil Dolin, MD;  [facility];  Service: Endoscopy;  Laterality: N/A;  10:15  . ESOPHAGOGASTRODUODENOSCOPY  11/2002   Dr. Gala Romney, erosive reflux esophagitis, multiple gastric ulcer and antral/bulbar erosions. Serologies positive for H.Pylori and was treated  . ESOPHAGOGASTRODUODENOSCOPY  11/20014   Dr. Gala Romney, small hh only, ulcers healed  . ESOPHAGOGASTRODUODENOSCOPY  09/21/2011   Dr Trevor Iha [facility], antral erosions, ?early GAVE  . FISTULOGRAM Left 12/10/2016   Procedure: THROMBECTOMY OF LEFT ARM ARTERIOVENOUS FISTULA;  Surgeon: Waynetta Sandy, MD;  Location: [place];  Service: Vascular;  Laterality: Left;  . INSERTION OF DIALYSIS CATHETER Left 12/10/2016   Procedure: INSERTION OF TUNNELED DIALYSIS CATHETER;  Surgeon: Waynetta Sandy, MD;  Location: [place];  Service: Vascular;  Laterality: Left;  . INSERTION OF DIALYSIS CATHETER Right 06/09/2018   Procedure: INSERTION OF DIALYSIS CATHETER Right subclavian;  Surgeon: Angelia Mould, MD;  Location: [place];  Service: Vascular;  Laterality: Right;  . IR DIALY SHUNT INTRO [place] W/IMG RIGHT Right 01/01/2018  . IR  GENERIC HISTORICAL  07/16/2016   IR REMOVAL TUN CV CATH W/O FL 07/16/2016 Saverio Danker, PA-C [facility]-INTERV RAD  . IR PTA ADDL CENTRAL DIALYSIS SEG THRU DIALY CIRCUIT RIGHT Right 10/21/2017  . IR REMOVAL TUN CV CATH W/O FL  05/12/2017  . IR THROMBECTOMY AV FISTULA W/THROMBOLYSIS/PTA INC/SHUNT/IMG RIGHT Right 10/21/2017  . IR THROMBECTOMY AV FISTULA W/THROMBOLYSIS/PTA INC/SHUNT/IMG RIGHT Right 11/27/2017  . IR US GUIDE VASC ACCESS RIGHT  10/21/2017  . IR US GUIDE VASC ACCESS RIGHT  11/27/2017  . IR US GUIDE VASC ACCESS RIGHT  01/01/2018  . LIGATION ARTERIOVENOUS GORTEX GRAFT Right 06/09/2018  . LIGATION ARTERIOVENOUS GORTEX GRAFT Right 06/09/2018   Procedure: LIGATION ARTERIOVENOUS GORTEX GRAFT RIGHT ARM;  Surgeon: Angelia Mould, MD;  Location: [place];  Service: Vascular;  Laterality: Right;  . LIGATION ARTERIOVENOUS GORTEX GRAFT Left 12/31/2018   Procedure: LIGATION OF LEFT UPPER ARM ARTERIVENOUS GORTEX GRAFT, LEFT BRACHIAL ARTERY ENDARTERECTOMY WITH BOVINE PATCH ANGIOPLASTY;  Surgeon: Waynetta Sandy, MD;  Location: [place];  Service: Vascular;  Laterality: Left;  . LIGATION OF ARTERIOVENOUS  FISTULA Left 12/19/2015   Procedure: LIGATION OF RADIOCEPHALIC ARTERIOVENOUS  FISTULA;  Surgeon: Conrad Peabody, MD;  Location: [place];  Service: Vascular;  Laterality: Left;  Marland Kitchen MASS EXCISION Right 02/18/2017   Procedure: EXCISION OF RIGHT AXILLARY EPIDERMAL INCLUSION CYST;  Surgeon: Conrad Buras, MD;  Location: [place];  Service: Vascular;  Laterality: Right;  . PERIPHERAL VASCULAR CATHETERIZATION N/A 12/14/2015   Procedure: Fistulagram;  Surgeon: Conrad [place], MD;  Location: [place] CV LAB;  Service: Cardiovascular;  Laterality: N/A;  . SHOULDER SURGERY Right    fracture  . TEE WITHOUT CARDIOVERSION N/A 08/16/2019   Procedure: TRANSESOPHAGEAL ECHOCARDIOGRAM (TEE);  Surgeon: Elouise Munroe, MD;  Location: [place];  Service: Cardiovascular;  Laterality: N/A;  . ULTRASOUND GUIDANCE FOR VASCULAR ACCESS   04/15/2017   Procedure: Ultrasound Guidance For Vascular Access;  Surgeon: Angelia Mould, MD;  Location: [place] CV LAB;  Service: Cardiovascular;;  . UPPER EXTREMITY VENOGRAPHY Bilateral 12/17/2016   Procedure: Bilateral Upper Extremity Venography;  Surgeon: Serafina Mitchell, MD;  Location: [place] CV LAB;  Service: Cardiovascular;  Laterality: Bilateral;  . UPPER EXTREMITY VENOGRAPHY Left 11/03/2018   Procedure: UPPER EXTREMITY VENOGRAPHY;  Surgeon: Serafina Mitchell, MD;  Location: [place] CV LAB;  Service: Cardiovascular;  Laterality: Left;   Social History   Occupational History  . Occupation: retired, Licensed conveyancer    Employer: RETIRED  Tobacco Use  . Smoking status: Former Smoker    Years: 25.00    Quit date: 04/29/2004    Years since quitting: 15.3  . Smokeless tobacco: Former Systems developer    Types: Chew    Quit date: 01/16/1987  . Tobacco comment: quit 2006  Substance and Sexual Activity  . Alcohol use: No  . Drug use: No  . Sexual activity: Not on file

## 2019-09-03 ENCOUNTER — Encounter (HOSPITAL_COMMUNITY)
Admission: RE | Disposition: A | Discharge status: Skilled Nursing Facility | Payer: Self-pay | Source: Home / Self Care | Attending: Internal Medicine

## 2019-09-03 ENCOUNTER — Encounter (HOSPITAL_COMMUNITY): Payer: Self-pay | Admitting: Orthopedic Surgery

## 2019-09-03 ENCOUNTER — Inpatient Hospital Stay (HOSPITAL_COMMUNITY)
Admission: RE | Admit: 2019-09-03 | Discharge: 2019-09-23 | DRG: 981 | Disposition: A | Discharge status: Skilled Nursing Facility | Payer: Medicare Other | Attending: Internal Medicine | Admitting: Internal Medicine

## 2019-09-03 ENCOUNTER — Inpatient Hospital Stay (HOSPITAL_COMMUNITY): Payer: Medicare Other

## 2019-09-03 DIAGNOSIS — R5381 Other malaise: Secondary | ICD-10-CM | POA: Diagnosis not present

## 2019-09-03 DIAGNOSIS — Z7189 Other specified counseling: Secondary | ICD-10-CM | POA: Diagnosis not present

## 2019-09-03 DIAGNOSIS — J90 Pleural effusion, not elsewhere classified: Secondary | ICD-10-CM | POA: Diagnosis not present

## 2019-09-03 DIAGNOSIS — I5032 Chronic diastolic (congestive) heart failure: Secondary | ICD-10-CM | POA: Diagnosis present

## 2019-09-03 DIAGNOSIS — Z992 Dependence on renal dialysis: Secondary | ICD-10-CM

## 2019-09-03 DIAGNOSIS — Y835 Amputation of limb(s) as the cause of abnormal reaction of the patient, or of later complication, without mention of misadventure at the time of the procedure: Secondary | ICD-10-CM | POA: Diagnosis present

## 2019-09-03 DIAGNOSIS — F419 Anxiety disorder, unspecified: Secondary | ICD-10-CM | POA: Diagnosis present

## 2019-09-03 DIAGNOSIS — M6281 Muscle weakness (generalized): Secondary | ICD-10-CM | POA: Diagnosis not present

## 2019-09-03 DIAGNOSIS — I493 Ventricular premature depolarization: Secondary | ICD-10-CM | POA: Diagnosis not present

## 2019-09-03 DIAGNOSIS — T8781 Dehiscence of amputation stump: Secondary | ICD-10-CM

## 2019-09-03 DIAGNOSIS — I739 Peripheral vascular disease, unspecified: Secondary | ICD-10-CM | POA: Diagnosis not present

## 2019-09-03 DIAGNOSIS — I360 Nonrheumatic tricuspid (valve) stenosis: Secondary | ICD-10-CM | POA: Diagnosis not present

## 2019-09-03 DIAGNOSIS — K449 Diaphragmatic hernia without obstruction or gangrene: Secondary | ICD-10-CM | POA: Diagnosis not present

## 2019-09-03 DIAGNOSIS — Z89512 Acquired absence of left leg below knee: Secondary | ICD-10-CM | POA: Diagnosis not present

## 2019-09-03 DIAGNOSIS — E1152 Type 2 diabetes mellitus with diabetic peripheral angiopathy with gangrene: Secondary | ICD-10-CM | POA: Diagnosis present

## 2019-09-03 DIAGNOSIS — I132 Hypertensive heart and chronic kidney disease with heart failure and with stage 5 chronic kidney disease, or end stage renal disease: Secondary | ICD-10-CM | POA: Diagnosis present

## 2019-09-03 DIAGNOSIS — K228 Other specified diseases of esophagus: Secondary | ICD-10-CM | POA: Diagnosis not present

## 2019-09-03 DIAGNOSIS — Z515 Encounter for palliative care: Secondary | ICD-10-CM | POA: Diagnosis not present

## 2019-09-03 DIAGNOSIS — K284 Chronic or unspecified gastrojejunal ulcer with hemorrhage: Secondary | ICD-10-CM | POA: Diagnosis not present

## 2019-09-03 DIAGNOSIS — I471 Supraventricular tachycardia, unspecified: Secondary | ICD-10-CM

## 2019-09-03 DIAGNOSIS — E1122 Type 2 diabetes mellitus with diabetic chronic kidney disease: Secondary | ICD-10-CM | POA: Diagnosis present

## 2019-09-03 DIAGNOSIS — Z4901 Encounter for fitting and adjustment of extracorporeal dialysis catheter: Secondary | ICD-10-CM | POA: Diagnosis not present

## 2019-09-03 DIAGNOSIS — I69354 Hemiplegia and hemiparesis following cerebral infarction affecting left non-dominant side: Secondary | ICD-10-CM | POA: Diagnosis not present

## 2019-09-03 DIAGNOSIS — R7401 Elevation of levels of liver transaminase levels: Secondary | ICD-10-CM | POA: Diagnosis not present

## 2019-09-03 DIAGNOSIS — Z89422 Acquired absence of other left toe(s): Secondary | ICD-10-CM

## 2019-09-03 DIAGNOSIS — Z7902 Long term (current) use of antithrombotics/antiplatelets: Secondary | ICD-10-CM | POA: Diagnosis not present

## 2019-09-03 DIAGNOSIS — R54 Age-related physical debility: Secondary | ICD-10-CM | POA: Diagnosis present

## 2019-09-03 DIAGNOSIS — Z681 Body mass index (BMI) 19 or less, adult: Secondary | ICD-10-CM | POA: Diagnosis not present

## 2019-09-03 DIAGNOSIS — Z89421 Acquired absence of other right toe(s): Secondary | ICD-10-CM

## 2019-09-03 DIAGNOSIS — Z9049 Acquired absence of other specified parts of digestive tract: Secondary | ICD-10-CM

## 2019-09-03 DIAGNOSIS — I4510 Unspecified right bundle-branch block: Secondary | ICD-10-CM | POA: Diagnosis present

## 2019-09-03 DIAGNOSIS — I120 Hypertensive chronic kidney disease with stage 5 chronic kidney disease or end stage renal disease: Secondary | ICD-10-CM | POA: Diagnosis not present

## 2019-09-03 DIAGNOSIS — I350 Nonrheumatic aortic (valve) stenosis: Secondary | ICD-10-CM

## 2019-09-03 DIAGNOSIS — K264 Chronic or unspecified duodenal ulcer with hemorrhage: Secondary | ICD-10-CM | POA: Diagnosis not present

## 2019-09-03 DIAGNOSIS — I428 Other cardiomyopathies: Secondary | ICD-10-CM | POA: Diagnosis not present

## 2019-09-03 DIAGNOSIS — Z79899 Other long term (current) drug therapy: Secondary | ICD-10-CM

## 2019-09-03 DIAGNOSIS — D72823 Leukemoid reaction: Secondary | ICD-10-CM | POA: Diagnosis not present

## 2019-09-03 DIAGNOSIS — I70262 Atherosclerosis of native arteries of extremities with gangrene, left leg: Secondary | ICD-10-CM | POA: Diagnosis present

## 2019-09-03 DIAGNOSIS — D72829 Elevated white blood cell count, unspecified: Secondary | ICD-10-CM

## 2019-09-03 DIAGNOSIS — T8754 Necrosis of amputation stump, left lower extremity: Secondary | ICD-10-CM | POA: Diagnosis not present

## 2019-09-03 DIAGNOSIS — Z7401 Bed confinement status: Secondary | ICD-10-CM | POA: Diagnosis not present

## 2019-09-03 DIAGNOSIS — Z20822 Contact with and (suspected) exposure to covid-19: Secondary | ICD-10-CM | POA: Diagnosis not present

## 2019-09-03 DIAGNOSIS — Z886 Allergy status to analgesic agent status: Secondary | ICD-10-CM

## 2019-09-03 DIAGNOSIS — D62 Acute posthemorrhagic anemia: Secondary | ICD-10-CM | POA: Diagnosis present

## 2019-09-03 DIAGNOSIS — Z86010 Personal history of colonic polyps: Secondary | ICD-10-CM

## 2019-09-03 DIAGNOSIS — Z452 Encounter for adjustment and management of vascular access device: Secondary | ICD-10-CM

## 2019-09-03 DIAGNOSIS — I96 Gangrene, not elsewhere classified: Secondary | ICD-10-CM | POA: Diagnosis not present

## 2019-09-03 DIAGNOSIS — Z89431 Acquired absence of right foot: Secondary | ICD-10-CM | POA: Diagnosis not present

## 2019-09-03 DIAGNOSIS — I6990 Unspecified sequelae of unspecified cerebrovascular disease: Secondary | ICD-10-CM | POA: Diagnosis not present

## 2019-09-03 DIAGNOSIS — N186 End stage renal disease: Secondary | ICD-10-CM | POA: Diagnosis not present

## 2019-09-03 DIAGNOSIS — I10 Essential (primary) hypertension: Secondary | ICD-10-CM | POA: Diagnosis present

## 2019-09-03 DIAGNOSIS — I472 Ventricular tachycardia: Secondary | ICD-10-CM | POA: Diagnosis not present

## 2019-09-03 DIAGNOSIS — I9589 Other hypotension: Secondary | ICD-10-CM

## 2019-09-03 DIAGNOSIS — R69 Illness, unspecified: Secondary | ICD-10-CM | POA: Diagnosis not present

## 2019-09-03 DIAGNOSIS — N2581 Secondary hyperparathyroidism of renal origin: Secondary | ICD-10-CM | POA: Diagnosis present

## 2019-09-03 DIAGNOSIS — I959 Hypotension, unspecified: Secondary | ICD-10-CM | POA: Diagnosis not present

## 2019-09-03 DIAGNOSIS — E43 Unspecified severe protein-calorie malnutrition: Secondary | ICD-10-CM | POA: Diagnosis not present

## 2019-09-03 DIAGNOSIS — K31819 Angiodysplasia of stomach and duodenum without bleeding: Secondary | ICD-10-CM | POA: Diagnosis not present

## 2019-09-03 DIAGNOSIS — I2510 Atherosclerotic heart disease of native coronary artery without angina pectoris: Secondary | ICD-10-CM | POA: Diagnosis present

## 2019-09-03 DIAGNOSIS — R9431 Abnormal electrocardiogram [ECG] [EKG]: Secondary | ICD-10-CM | POA: Diagnosis not present

## 2019-09-03 DIAGNOSIS — E785 Hyperlipidemia, unspecified: Secondary | ICD-10-CM | POA: Diagnosis not present

## 2019-09-03 DIAGNOSIS — S68112S Complete traumatic metacarpophalangeal amputation of right middle finger, sequela: Secondary | ICD-10-CM | POA: Diagnosis not present

## 2019-09-03 DIAGNOSIS — L89152 Pressure ulcer of sacral region, stage 2: Secondary | ICD-10-CM | POA: Diagnosis not present

## 2019-09-03 DIAGNOSIS — R600 Localized edema: Secondary | ICD-10-CM | POA: Diagnosis not present

## 2019-09-03 DIAGNOSIS — D509 Iron deficiency anemia, unspecified: Secondary | ICD-10-CM | POA: Diagnosis present

## 2019-09-03 DIAGNOSIS — K922 Gastrointestinal hemorrhage, unspecified: Secondary | ICD-10-CM

## 2019-09-03 DIAGNOSIS — L89153 Pressure ulcer of sacral region, stage 3: Secondary | ICD-10-CM | POA: Diagnosis not present

## 2019-09-03 DIAGNOSIS — K3189 Other diseases of stomach and duodenum: Secondary | ICD-10-CM | POA: Diagnosis not present

## 2019-09-03 DIAGNOSIS — K269 Duodenal ulcer, unspecified as acute or chronic, without hemorrhage or perforation: Secondary | ICD-10-CM | POA: Diagnosis not present

## 2019-09-03 DIAGNOSIS — Z01810 Encounter for preprocedural cardiovascular examination: Secondary | ICD-10-CM | POA: Diagnosis not present

## 2019-09-03 DIAGNOSIS — D631 Anemia in chronic kidney disease: Secondary | ICD-10-CM | POA: Diagnosis present

## 2019-09-03 DIAGNOSIS — Z8249 Family history of ischemic heart disease and other diseases of the circulatory system: Secondary | ICD-10-CM

## 2019-09-03 DIAGNOSIS — K260 Acute duodenal ulcer with hemorrhage: Secondary | ICD-10-CM | POA: Diagnosis not present

## 2019-09-03 DIAGNOSIS — Z8719 Personal history of other diseases of the digestive system: Secondary | ICD-10-CM

## 2019-09-03 HISTORY — PX: CENTRAL LINE INSERTION: CATH118232

## 2019-09-03 LAB — COMPREHENSIVE METABOLIC PANEL
ALT: 11 U/L (ref 0–44)
AST: 16 U/L (ref 15–41)
Albumin: 1.7 g/dL — ABNORMAL LOW (ref 3.5–5.0)
Alkaline Phosphatase: 65 U/L (ref 38–126)
Anion gap: 12 (ref 5–15)
BUN: 39 mg/dL — ABNORMAL HIGH (ref 8–23)
CO2: 26 mmol/L (ref 22–32)
Calcium: 9.2 mg/dL (ref 8.9–10.3)
Chloride: 98 mmol/L (ref 98–111)
Creatinine, Ser: 5.7 mg/dL — ABNORMAL HIGH (ref 0.61–1.24)
GFR calc Af Amer: 10 mL/min — ABNORMAL LOW (ref >60)
GFR calc non Af Amer: 9 mL/min — ABNORMAL LOW (ref >60)
Glucose, Bld: 78 mg/dL (ref 70–99)
Potassium: 4.2 mmol/L (ref 3.5–5.1)
Sodium: 136 mmol/L (ref 135–145)
Total Bilirubin: 1 mg/dL (ref 0.3–1.2)
Total Protein: 6.4 g/dL — ABNORMAL LOW (ref 6.5–8.1)

## 2019-09-03 LAB — RESPIRATORY PANEL BY RT PCR (FLU A&B, COVID)
Influenza A by PCR: NEGATIVE
Influenza B by PCR: NEGATIVE
SARS Coronavirus 2 by RT PCR: NEGATIVE

## 2019-09-03 LAB — CBC
HCT: 26.1 % — ABNORMAL LOW (ref 39.0–52.0)
HCT: 25.9 % — ABNORMAL LOW (ref 39.0–52.0)
Hemoglobin: 8.2 g/dL — ABNORMAL LOW (ref 13.0–17.0)
Hemoglobin: 8.1 g/dL — ABNORMAL LOW (ref 13.0–17.0)
MCH: 19.7 pg — ABNORMAL LOW (ref 26.0–34.0)
MCH: 19.8 pg — ABNORMAL LOW (ref 26.0–34.0)
MCHC: 31.4 g/dL (ref 30.0–36.0)
MCHC: 31.3 g/dL (ref 30.0–36.0)
MCV: 62.7 fL — ABNORMAL LOW (ref 80.0–100.0)
MCV: 63.3 fL — ABNORMAL LOW (ref 80.0–100.0)
Platelets: 329 10*3/uL (ref 150–400)
Platelets: 328 10*3/uL (ref 150–400)
RBC: 4.16 MIL/uL — ABNORMAL LOW (ref 4.22–5.81)
RBC: 4.09 MIL/uL — ABNORMAL LOW (ref 4.22–5.81)
RDW: 19.7 % — ABNORMAL HIGH (ref 11.5–15.5)
RDW: 19.6 % — ABNORMAL HIGH (ref 11.5–15.5)
WBC: 17.9 10*3/uL — ABNORMAL HIGH (ref 4.0–10.5)
WBC: 16.1 10*3/uL — ABNORMAL HIGH (ref 4.0–10.5)
nRBC: 0.1 % (ref 0.0–0.2)
nRBC: 0.2 % (ref 0.0–0.2)

## 2019-09-03 LAB — MAGNESIUM: Magnesium: 2.2 mg/dL (ref 1.7–2.4)

## 2019-09-03 LAB — IRON AND TIBC
Iron: 14 ug/dL — ABNORMAL LOW (ref 45–182)
Saturation Ratios: 11 % — ABNORMAL LOW (ref 17.9–39.5)
TIBC: 132 ug/dL — ABNORMAL LOW (ref 250–450)
UIBC: 118 ug/dL

## 2019-09-03 LAB — FERRITIN: Ferritin: 371 ng/mL — ABNORMAL HIGH (ref 24–336)

## 2019-09-03 SURGERY — AMPUTATION, ABOVE KNEE
Anesthesia: General | Site: Knee | Laterality: Left

## 2019-09-03 SURGERY — CENTRAL LINE INSERTION

## 2019-09-03 MED ORDER — MIDODRINE HCL 5 MG PO TABS
ORAL_TABLET | ORAL | Status: AC
  Administered 2019-09-03: 10 mg via ORAL
  Filled 2019-09-03: qty 2

## 2019-09-03 MED ORDER — ATORVASTATIN CALCIUM 40 MG PO TABS
40.0000 mg | ORAL_TABLET | Freq: Every day | ORAL | Status: DC
  Administered 2019-09-03 – 2019-09-22 (×18): 40 mg via ORAL
  Filled 2019-09-03 (×18): qty 1

## 2019-09-03 MED ORDER — B COMPLEX-C PO TABS
1.0000 | ORAL_TABLET | Freq: Every day | ORAL | Status: DC
  Administered 2019-09-05 – 2019-09-23 (×18): 1 via ORAL
  Filled 2019-09-03 (×20): qty 1

## 2019-09-03 MED ORDER — CHLORHEXIDINE GLUCONATE CLOTH 2 % EX PADS
6.0000 | MEDICATED_PAD | Freq: Every day | CUTANEOUS | Status: DC
  Administered 2019-09-05: 6 via TOPICAL
  Filled 2019-09-03: qty 6

## 2019-09-03 MED ORDER — PEG-KCL-NACL-NASULF-NA ASC-C 100 G PO SOLR
0.5000 | Freq: Once | ORAL | Status: AC
  Administered 2019-09-04: 03:00:00 100 g via ORAL

## 2019-09-03 MED ORDER — SODIUM CHLORIDE 0.9 % IV BOLUS: 1000.0000 mL | Freq: Once | INTRAVENOUS | Status: DC

## 2019-09-03 MED ORDER — ASCORBIC ACID 500 MG PO TABS
500.0000 mg | ORAL_TABLET | ORAL | Status: DC
  Administered 2019-09-04 – 2019-09-22 (×18): 500 mg via ORAL
  Filled 2019-09-03 (×18): qty 1

## 2019-09-03 MED ORDER — SODIUM CHLORIDE 0.9% FLUSH
3.0000 mL | Freq: Two times a day (BID) | INTRAVENOUS | Status: DC
  Administered 2019-09-05 – 2019-09-23 (×5): 3 mL via INTRAVENOUS

## 2019-09-03 MED ORDER — ACETAMINOPHEN 650 MG RE SUPP: 650.0000 mg | Freq: Four times a day (QID) | RECTAL | Status: DC | PRN

## 2019-09-03 MED ORDER — SODIUM CHLORIDE 0.9 % IV SOLN: INTRAVENOUS | Status: DC

## 2019-09-03 MED ORDER — MELATONIN 3 MG PO TABS
3.0000 mg | ORAL_TABLET | Freq: Every day | ORAL | Status: DC
  Administered 2019-09-03 – 2019-09-22 (×18): 3 mg via ORAL
  Filled 2019-09-03 (×22): qty 1

## 2019-09-03 MED ORDER — PEG-KCL-NACL-NASULF-NA ASC-C 100 G PO SOLR
0.5000 | Freq: Once | ORAL | Status: AC
  Administered 2019-09-03: 20:00:00 100 g via ORAL
  Filled 2019-09-03: qty 1

## 2019-09-03 MED ORDER — CHLORHEXIDINE GLUCONATE 0.12 % MT SOLN: 15.0000 mL | Freq: Once | OROMUCOSAL | Status: DC

## 2019-09-03 MED ORDER — ACETAMINOPHEN 325 MG PO TABS
650.0000 mg | ORAL_TABLET | Freq: Four times a day (QID) | ORAL | Status: DC | PRN
  Administered 2019-09-04 – 2019-09-19 (×4): 650 mg via ORAL
  Filled 2019-09-03 (×6): qty 2

## 2019-09-03 MED ORDER — MIDODRINE HCL 5 MG PO TABS
10.0000 mg | ORAL_TABLET | Freq: Three times a day (TID) | ORAL | Status: DC
  Administered 2019-09-04 – 2019-09-23 (×50): 10 mg via ORAL
  Filled 2019-09-03 (×49): qty 2

## 2019-09-03 MED ORDER — SODIUM CHLORIDE 0.9% FLUSH: 3.0000 mL | INTRAVENOUS | Status: DC | PRN

## 2019-09-03 MED ORDER — VITAMIN D 25 MCG (1000 UNIT) PO TABS
1000.0000 [IU] | ORAL_TABLET | Freq: Every day | ORAL | Status: DC
  Administered 2019-09-04 – 2019-09-23 (×19): 1000 [IU] via ORAL
  Filled 2019-09-03 (×19): qty 1

## 2019-09-03 MED ORDER — ORAL CARE MOUTH RINSE: 15.0000 mL | Freq: Once | OROMUCOSAL | Status: DC

## 2019-09-03 MED ORDER — CINACALCET HCL 30 MG PO TABS
60.0000 mg | ORAL_TABLET | Freq: Every day | ORAL | Status: DC
  Administered 2019-09-03 – 2019-09-22 (×19): 60 mg via ORAL
  Filled 2019-09-03 (×19): qty 2

## 2019-09-03 MED ORDER — SODIUM CHLORIDE 0.9 % IV SOLN: 250.0000 mL | INTRAVENOUS | Status: DC | PRN

## 2019-09-03 MED ORDER — PEG-KCL-NACL-NASULF-NA ASC-C 100 G PO SOLR: 1.0000 | Freq: Once | ORAL | Status: DC

## 2019-09-03 MED ORDER — ALBUTEROL SULFATE (2.5 MG/3ML) 0.083% IN NEBU: 2.5000 mg | INHALATION_SOLUTION | RESPIRATORY_TRACT | Status: DC | PRN

## 2019-09-03 MED ORDER — CEFAZOLIN SODIUM-DEXTROSE 2-4 GM/100ML-% IV SOLN: 2.0000 g | INTRAVENOUS | Status: DC

## 2019-09-03 MED ORDER — SODIUM CHLORIDE 0.9% FLUSH
3.0000 mL | Freq: Two times a day (BID) | INTRAVENOUS | Status: DC
  Administered 2019-09-05 – 2019-09-23 (×12): 3 mL via INTRAVENOUS

## 2019-09-03 NOTE — Progress Notes (Addendum)
Hospitalist is at patient bedside.

## 2019-09-03 NOTE — Consult Note (Signed)
[facility] Renal Consultation Note    Indication for Consultation:  Management of ESRD/hemodialysis; anemia, hypertension/volume and secondary hyperparathyroidism  URK:YHCWC, Tesfaye, MD  HPI: Joseph REQUENA Sr. is a 79 y.o. male. ESRD on HD MWF at [facility]. Past medical history significant for HTN, chronic hypotension on midodrine, CVA, aortic stenosis, HLD, NICM EF 55-65%, and PVD s/p multiple amputations and hx gastric ulcers.   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  Surgery was cancelled today due to rectal bleeding noted while patient was in short stay.  Patient seen and examined at bedside and is a poor historian.  Reports this has happened before but unsure when.  Denies abdominal pain, n/v/d, CP, SOB, weakness, dizziness and fatigue.  No labs have been colleted due to difficultly sticking veins.  Patient has been admitted for further evaluation and treatment.   Past Medical History:  Diagnosis Date  . Acute osteomyelitis of left foot ([place]) 07/21/2019  . Anxiety   . Aortic stenosis   . Arthritis   . Closed avulsion fracture of right hip ([place]) 06/30/2019  . ESRD (end stage renal disease) on dialysis [facility])    M/W/F at [facility] in [place]  . Essential hypertension   . Gangrene of left foot ([place]) 06/04/2018  . Gangrene of right foot ([place])   . Gastric ulcer 2004  . GI bleed   . History of blood transfusion   . History of cardiomyopathy    LVEF normal as of February 2017  . History of gastric ulcer   . History of stroke    Left side weakness  . Iron deficiency anemia   . Osteomyelitis ([place])   . Peripheral vascular disease [facility])    Past Surgical History:  Procedure Laterality Date  . ABDOMINAL AORTAGRAM N/A 01/24/2012   Procedure: ABDOMINAL Maxcine Ham;  Surgeon: Elam Dutch, MD;  Location: [facility] CATH LAB;  Service: Cardiovascular;  Laterality: N/A;  . ABDOMINAL  AORTOGRAM W/LOWER EXTREMITY N/A 04/15/2017   Procedure: ABDOMINAL AORTOGRAM W/LOWER EXTREMITY;  Surgeon: Angelia Mould, MD;  Location: [place] CV LAB;  Service: Cardiovascular;  Laterality: N/A;  . AMPUTATION Right 05/09/2017   Procedure: RIGHT TRANSMETATARSAL AMPUTATION;  Surgeon: [name], MD;  Location: [place];  Service: Orthopedics;  Laterality: Right;  . AMPUTATION Right 06/13/2018   Procedure: AMPUTATION RIGHT LONG FINGER;  Surgeon: Dayna Barker, MD;  Location: [place];  Service: Plastics;  Laterality: Right;  . AMPUTATION Left 07/21/2019   Procedure: LEFT TRANSMETATARSAL AMPUTATION;  Surgeon: [name], MD;  Location: [place];  Service: Orthopedics;  Laterality: Left;  . AMPUTATION Left 08/13/2019   Procedure: AMPUTATION BELOW KNEE;  Surgeon: [name], MD;  Location: [place];  Service: Orthopedics;  Laterality: Left;  . ARTERIOVENOUS GRAFT PLACEMENT Right right arm  . AV FISTULA PLACEMENT Left 08/31/2015   Procedure: ARTERIOVENOUS (AV) FISTULA CREATION- LEFT RADIOCEPHALIC;  Surgeon: Mal Misty, MD;  Location: [place];  Service: Vascular;  Laterality: Left;  . AV FISTULA PLACEMENT Right 02/18/2017   Procedure: INSERTION OF ARTERIOVENOUS (AV) GORE-TEX GRAFT  RIGHT UPPER ARM;  Surgeon: Conrad [place], MD;  Location: [place];  Service: Vascular;  Laterality: Right;  . AV FISTULA PLACEMENT Left 12/01/2018   Procedure: INSERTION OF ARTERIOVENOUS (AV) GORE-TEX GRAFT ARM;  Surgeon: Angelia Mould, MD;  Location: [place];  Service: Vascular;  Laterality: Left;  . AV FISTULA PLACEMENT Left  02/09/2019   Procedure: INSERTION OF ARTERIOVENOUS (AV) GORE-TEX GRAFT LEFT THIGH;  Surgeon: Waynetta Sandy, MD;  Location: [place];  Service: Vascular;  Laterality: Left;  . BASCILIC VEIN TRANSPOSITION Left 12/19/2015   Procedure: FIRST STAGE BRACHIAL VEIN TRANSPOSITION;  Surgeon: Conrad [place], MD;  Location: [place];  Service: Vascular;  Laterality: Left;  . BASCILIC VEIN  TRANSPOSITION Left 02/08/2016   Procedure: SECOND STAGE BRACHIAL VEIN TRANSPOSITION;  Surgeon: Conrad Lemay, MD;  Location: [place];  Service: Vascular;  Laterality: Left;  . BUBBLE STUDY  08/16/2019   Procedure: BUBBLE STUDY;  Surgeon: Elouise Munroe, MD;  Location: [place];  Service: Cardiovascular;;  . CARDIAC CATHETERIZATION N/A 07/11/2015   Procedure: Left Heart Cath and Coronary Angiography;  Surgeon: Troy Sine, MD;  Location: [place] CV LAB;  Service: Cardiovascular;  Laterality: N/A;  . Carpel Tunnel Left Dec. 22, 2016  . CHOLECYSTECTOMY    . COLONOSCOPY  2004   Dr. Irving Shows, left sided diverticula and cecal polyp, path unknown  . COLONOSCOPY  10/29/2011   Procedure: COLONOSCOPY;  Surgeon: Daneil Dolin, MD;  [facility];  Service: Endoscopy;  Laterality: N/A;  10:15  . ESOPHAGOGASTRODUODENOSCOPY  11/2002   Dr. Gala Romney, erosive reflux esophagitis, multiple gastric ulcer and antral/bulbar erosions. Serologies positive for H.Pylori and was treated  . ESOPHAGOGASTRODUODENOSCOPY  11/20014   Dr. Gala Romney, small hh only, ulcers healed  . ESOPHAGOGASTRODUODENOSCOPY  09/21/2011   Dr Trevor Iha [facility], antral erosions, ?early GAVE  . FISTULOGRAM Left 12/10/2016   Procedure: THROMBECTOMY OF LEFT ARM ARTERIOVENOUS FISTULA;  Surgeon: Waynetta Sandy, MD;  Location: [place];  Service: Vascular;  Laterality: Left;  . INSERTION OF DIALYSIS CATHETER Left 12/10/2016   Procedure: INSERTION OF TUNNELED DIALYSIS CATHETER;  Surgeon: Waynetta Sandy, MD;  Location: [place];  Service: Vascular;  Laterality: Left;  . INSERTION OF DIALYSIS CATHETER Right 06/09/2018   Procedure: INSERTION OF DIALYSIS CATHETER Right subclavian;  Surgeon: Angelia Mould, MD;  Location: [place];  Service: Vascular;  Laterality: Right;  . IR DIALY SHUNT INTRO [place] W/IMG RIGHT Right 01/01/2018  . IR GENERIC HISTORICAL  07/16/2016   IR REMOVAL TUN CV CATH W/O FL 07/16/2016 Saverio Danker, PA-C [facility]-INTERV RAD  . IR PTA ADDL CENTRAL DIALYSIS SEG THRU DIALY CIRCUIT RIGHT Right 10/21/2017  . IR REMOVAL TUN CV CATH W/O FL  05/12/2017  . IR THROMBECTOMY AV FISTULA W/THROMBOLYSIS/PTA INC/SHUNT/IMG RIGHT Right 10/21/2017  . IR THROMBECTOMY AV FISTULA W/THROMBOLYSIS/PTA INC/SHUNT/IMG RIGHT Right 11/27/2017  . IR US GUIDE VASC ACCESS RIGHT  10/21/2017  . IR US GUIDE VASC ACCESS RIGHT  11/27/2017  . IR US GUIDE VASC ACCESS RIGHT  01/01/2018  . LIGATION ARTERIOVENOUS GORTEX GRAFT Right 06/09/2018  . LIGATION ARTERIOVENOUS GORTEX GRAFT Right 06/09/2018   Procedure: LIGATION ARTERIOVENOUS GORTEX GRAFT RIGHT ARM;  Surgeon: Angelia Mould, MD;  Location: [place];  Service: Vascular;  Laterality: Right;  . LIGATION ARTERIOVENOUS GORTEX GRAFT Left 12/31/2018   Procedure: LIGATION OF LEFT UPPER ARM ARTERIVENOUS GORTEX GRAFT, LEFT BRACHIAL ARTERY ENDARTERECTOMY WITH BOVINE PATCH ANGIOPLASTY;  Surgeon: Waynetta Sandy, MD;  Location: [place];  Service: Vascular;  Laterality: Left;  . LIGATION OF ARTERIOVENOUS  FISTULA Left 12/19/2015   Procedure: LIGATION OF RADIOCEPHALIC ARTERIOVENOUS  FISTULA;  Surgeon: Conrad McMullen, MD;  Location: [place];  Service: Vascular;  Laterality: Left;  Marland Kitchen MASS EXCISION Right 02/18/2017   Procedure: EXCISION OF RIGHT AXILLARY EPIDERMAL INCLUSION  CYST;  Surgeon: Conrad [place], MD;  Location: [place];  Service: Vascular;  Laterality: Right;  . PERIPHERAL VASCULAR CATHETERIZATION N/A 12/14/2015   Procedure: Fistulagram;  Surgeon: Conrad , MD;  Location: [place] CV LAB;  Service: Cardiovascular;  Laterality: N/A;  . SHOULDER SURGERY Right    fracture  . TEE WITHOUT CARDIOVERSION N/A 08/16/2019   Procedure: TRANSESOPHAGEAL ECHOCARDIOGRAM (TEE);  Surgeon: Elouise Munroe, MD;  Location: [place];  Service: Cardiovascular;  Laterality: N/A;  . ULTRASOUND GUIDANCE FOR VASCULAR ACCESS  04/15/2017   Procedure: Ultrasound Guidance For Vascular Access;  Surgeon:  Angelia Mould, MD;  Location: [place] CV LAB;  Service: Cardiovascular;;  . UPPER EXTREMITY VENOGRAPHY Bilateral 12/17/2016   Procedure: Bilateral Upper Extremity Venography;  Surgeon: Serafina Mitchell, MD;  Location: [place] CV LAB;  Service: Cardiovascular;  Laterality: Bilateral;  . UPPER EXTREMITY VENOGRAPHY Left 11/03/2018   Procedure: UPPER EXTREMITY VENOGRAPHY;  Surgeon: Serafina Mitchell, MD;  Location: [place] CV LAB;  Service: Cardiovascular;  Laterality: Left;   Family History  Problem Relation Age of Onset  . Hypertension Mother   . Colon cancer Neg Hx   . Liver disease Neg Hx    Social History:  reports that he quit smoking about 15 years ago. He quit after 25.00 years of use. He quit smokeless tobacco use about 32 years ago.  His smokeless tobacco use included chew. He reports that he does not drink alcohol or use drugs. Allergies  Allergen Reactions  . Aspirin Other (See Comments)    Causes internal bleeding  History of ulcers   Prior to Admission medications   Medication Sig Start Date End Date Taking? Authorizing Provider  Amino Acids-Protein Hydrolys (FEEDING SUPPLEMENT, PRO-STAT SUGAR FREE 64,) LIQD Take 30 mLs by mouth in the morning and at bedtime. (0900 & 2100)   Yes [provider]  atorvastatin (LIPITOR) 40 MG tablet Take 40 mg by mouth daily. (0900) 11/03/15  Yes [provider]  B Complex-C (B-COMPLEX WITH VITAMIN C) tablet Take 1 tablet by mouth daily. (0900)   Yes [provider]  cholecalciferol (VITAMIN D3) 25 MCG (1000 UT) tablet Take 1,000 Units by mouth daily. (0900)   Yes [provider]  cinacalcet (SENSIPAR) 60 MG tablet Take 60 mg by mouth daily at 10 pm. (2100)   Yes [provider]  clopidogrel (PLAVIX) 75 MG tablet Take 75 mg by mouth daily. (0900) 05/24/19  Yes [provider]  magnesium hydroxide (MILK OF MAGNESIA) 400 MG/5ML suspension Take 30 mLs by mouth 2 (two) times daily as  needed (constipation).   Yes [provider]  melatonin 3 MG TABS tablet Take 3 mg by mouth at bedtime. (2200)   Yes [provider]  midodrine (PROAMATINE) 10 MG tablet Take 1 tablet (10 mg total) by mouth 3 (three) times daily. Patient taking differently: Take 10 mg by mouth 3 (three) times daily. (0900, 1300 & 1700) 08/20/19 09/19/19 Yes Elodia Florence., MD  mirtazapine (REMERON) 7.5 MG tablet Take 7.5 mg by mouth at bedtime. (2200)   Yes [provider]  Nutritional Supplements (FEEDING SUPPLEMENT, NEPRO CARB STEADY,) LIQD Take 237 mLs by mouth 2 (two) times daily between meals. Patient taking differently: Take 237 mLs by mouth 2 (two) times daily between meals. (0800 & 2100) 07/22/19  Yes Persons, Bevely Palmer, PA  oxyCODONE (OXY IR/ROXICODONE) 5 MG immediate release tablet Take 5 mg by mouth every 4 (four) hours  as needed (mild to moderate pain).   Yes [provider]  paricalcitol (ZEMPLAR) 2 MCG capsule Take 2 mcg by mouth See admin instructions. Take 1 capsule (2 mcg) by mouth 3 times daily on Mondays, Wednesdays, & Fridays at 1200, 1700, & 2200.   Yes [provider]  saccharomyces boulardii (FLORASTOR) 250 MG capsule Take 250 mg by mouth 2 (two) times daily. (0800 & 2000)   Yes [provider]  sennosides-docusate sodium (SENOKOT-S) 8.6-50 MG tablet Take 1 tablet by mouth daily. (0800)   Yes [provider]  vitamin C (ASCORBIC ACID) 500 MG tablet Take 500 mg by mouth every evening. (1700)   Yes [provider]  doxercalciferol ([name]) 2.5 MCG capsule Take 1 capsule (2.5 mcg total) by mouth every Monday, Wednesday, and Friday with hemodialysis. Patient not taking: Reported on 09/02/2019 08/20/19 09/19/19  Elodia Florence., MD   Current Facility-Administered Medications  Medication Dose Route Frequency Provider Last Rate Last Admin  . 0.9 %  sodium chloride infusion  100 mL Intravenous PRN Alric Seton, PA-C       . 0.9 %  sodium chloride infusion  100 mL Intravenous PRN Alric Seton, PA-C      . [START ON 09/04/2019] Chlorhexidine Gluconate Cloth 2 % PADS 6 each  6 each Topical Q0600 Oberon Hehir, Ria Comment, [place]      . [START ON 09/04/2019] peg 3350 powder (MOVIPREP) kit 200 g  1 kit Oral Once Willia Craze, NP       Labs: Basic Metabolic Panel: No results for input(s): NA, K, CL, CO2, GLUCOSE, BUN, CREATININE, CALCIUM, PHOS in the last 168 hours.  Invalid input(s): ALB Liver Function Tests: No results for input(s): AST, ALT, ALKPHOS, BILITOT, PROT, ALBUMIN in the last 168 hours. No results for input(s): LIPASE, AMYLASE in the last 168 hours. No results for input(s): AMMONIA in the last 168 hours. CBC: No results for input(s): WBC, NEUTROABS, HGB, HCT, MCV, PLT in the last 168 hours. Cardiac Enzymes: No results for input(s): CKTOTAL, CKMB, CKMBINDEX, TROPONINI in the last 168 hours. CBG: No results for input(s): GLUCAP in the last 168 hours. Iron Studies: No results for input(s): IRON, TIBC, TRANSFERRIN, FERRITIN in the last 72 hours. Studies/Results: No results found.  ROS: All others negative except those listed in HPI.  Physical Exam: Vitals:   09/03/19 0836  BP: (!) 138/51  Pulse: 86  Temp: 97.8 F (36.6 C)  SpO2: 100%  Weight: 65.8 kg  Height: _0  (1.651 m)     General: chronically ill appearing male in NAD Head: NCAT sclera not icteric Neck: Supple. No lymphadenopathy. No JVD Lungs: CTA bilaterally. No wheeze, rales or rhonchi. Breathing is unlabored. Heart: RRR. No murmur, rubs or gallops.  Abdomen: soft, nontender, +BS, no guarding, no rebound tenderness  Lower extremities:no edema, R TMA, L BKA - dressed Neuro: AAOx3. Moves all extremities spontaneously. Psych:  Responds to questions appropriately with a normal affect. Dialysis Access: L AVG  Dialysis Orders:  Calling OP dialysis center to obtain regular orders.   Assessment/Plan: 1.  Rectal bleeding - large BM  w/red blood. GI following 2. L BKA wound dehiscence -scheduled to AKA today, cancelled d/t rectal bleeding. per ortho 3.  ESRD -  On HD MWF, orders written for HD today per regular schedule.  Labs ordered prior, according to last hospitalization on 2K bath, will adjust if labs indicate 4.  Hypertension/volume  - Blood pressure mostly well controlled. Does not appear volume  overloaded.  Plan for minimal UF with HD today. Will order midodrine with HD 5.  Anemia of CKD - No labs as of yet, ordered pre HD.  6.  Secondary Hyperparathyroidism -  No labs as of yet ordered pre HD.  Will order VRDA/binders once we get labs and OP orders.  7.  Nutrition - Renal diet w/fluid restrictions 8. Diabetes 9. Hx CVA  Jen Mow, PA-C [facility] Pager: [phone] 09/03/2019, 1:36 PM

## 2019-09-03 NOTE — H&P (Addendum)
History and Physical    Priscilla Finklea KZS:[id number] DOB: 1940-09-23 DOA: 09/03/2019  PCP: [name], MD   I have briefly reviewed patients previous medical reports in [facility].  Patient coming from: SNF  Chief Complaint: Rectal bleeding  HPI: Kiven Vangilder. 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  However in the preoperative area, patient noted to have rectal bleeding for which  GI was consulted and advised that the procedure will need to be postponed.  TRH was thereby consulted to admit patient for further evaluation and management of GI bleeding. Patient is a poor historian.  States that he may have had rectal bleeding for the last 5 days but unable to provide much details.  States that he had 1 day history of severe lower abdominal pain and some nonbloody emesis.  As per discussion with GI team and his nurses in the preop area, patient had a moderate rectal bleeding, bright red blood with some clots.  Remained hemodynamically stable.  They were unable to get any labs because he is a difficult stick and no IV lines either.  Currently patient denies  chest pain, dyspnea, dizziness, lightheadedness or pain elsewhere.  ED Course: Patient did not come via ED.  Review of Systems:  All other systems reviewed and apart from HPI, are negative.  Past Medical History:  Diagnosis Date  . Acute osteomyelitis of left foot ([place]) 07/21/2019  . Anxiety   . Aortic stenosis   . Arthritis   . Closed avulsion fracture of right hip ([place]) 06/30/2019  . ESRD (end stage renal disease) on dialysis [facility])    M/W/F at [facility] in [place]  . Essential hypertension   . Gangrene of left foot ([place]) 06/04/2018  . Gangrene of right foot ([place])   . Gastric ulcer 2004  . GI bleed   . History of blood transfusion   . History of cardiomyopathy    LVEF normal as of February 2017  . History of gastric ulcer   . History of stroke    Left side weakness  . Iron deficiency anemia   . Osteomyelitis ([place])   . Peripheral vascular disease [facility])     Past Surgical History:  Procedure Laterality Date  . ABDOMINAL AORTAGRAM N/A 01/24/2012   Procedure: ABDOMINAL Maxcine Ham;  Surgeon: Elam Dutch, MD;  Location: [facility] CATH LAB;  Service: Cardiovascular;  Laterality: N/A;  . ABDOMINAL AORTOGRAM W/LOWER EXTREMITY N/A 04/15/2017   Procedure: ABDOMINAL AORTOGRAM W/LOWER EXTREMITY;  Surgeon: Angelia Mould, MD;  Location: [place] CV LAB;  Service: Cardiovascular;  Laterality: N/A;  . AMPUTATION Right 05/09/2017   Procedure: RIGHT TRANSMETATARSAL AMPUTATION;  Surgeon: [name], MD;  Location: [place];  Service: Orthopedics;  Laterality: Right;  . AMPUTATION Right 06/13/2018   Procedure: AMPUTATION RIGHT LONG FINGER;  Surgeon: Dayna Barker, MD;  Location: [place];  Service:  Plastics;  Laterality: Right;  . AMPUTATION Left 07/21/2019   Procedure: LEFT TRANSMETATARSAL AMPUTATION;  Surgeon: [name], MD;  Location: [place];  Service: Orthopedics;  Laterality: Left;  . AMPUTATION Left 08/13/2019   Procedure: AMPUTATION BELOW KNEE;  Surgeon: [name], MD;   Location: [place];  Service: Orthopedics;  Laterality: Left;  . ARTERIOVENOUS GRAFT PLACEMENT Right right arm  . AV FISTULA PLACEMENT Left 08/31/2015   Procedure: ARTERIOVENOUS (AV) FISTULA CREATION- LEFT RADIOCEPHALIC;  Surgeon: Mal Misty, MD;  Location: [place];  Service: Vascular;  Laterality: Left;  . AV FISTULA PLACEMENT Right 02/18/2017   Procedure: INSERTION OF ARTERIOVENOUS (AV) GORE-TEX GRAFT  RIGHT UPPER ARM;  Surgeon: Conrad [place], MD;  Location: [place];  Service: Vascular;  Laterality: Right;  . AV FISTULA PLACEMENT Left 12/01/2018   Procedure: INSERTION OF ARTERIOVENOUS (AV) GORE-TEX GRAFT ARM;  Surgeon: Angelia Mould, MD;  Location: [place];  Service: Vascular;  Laterality: Left;  . AV FISTULA PLACEMENT Left 02/09/2019   Procedure: INSERTION OF ARTERIOVENOUS (AV) GORE-TEX GRAFT LEFT THIGH;  Surgeon: Waynetta Sandy, MD;  Location: [place];  Service: Vascular;  Laterality: Left;  . BASCILIC VEIN TRANSPOSITION Left 12/19/2015   Procedure: FIRST STAGE BRACHIAL VEIN TRANSPOSITION;  Surgeon: Conrad [place], MD;  Location: [place];  Service: Vascular;  Laterality: Left;  . BASCILIC VEIN TRANSPOSITION Left 02/08/2016   Procedure: SECOND STAGE BRACHIAL VEIN TRANSPOSITION;  Surgeon: Conrad Bryan, MD;  Location: [place];  Service: Vascular;  Laterality: Left;  . BUBBLE STUDY  08/16/2019   Procedure: BUBBLE STUDY;  Surgeon: Elouise Munroe, MD;  Location: [place];  Service: Cardiovascular;;  . CARDIAC CATHETERIZATION N/A 07/11/2015   Procedure: Left Heart Cath and Coronary Angiography;  Surgeon: Troy Sine, MD;  Location: [place] CV LAB;  Service: Cardiovascular;  Laterality: N/A;  . Carpel Tunnel Left Dec. 22, 2016  . CHOLECYSTECTOMY    . COLONOSCOPY  2004   Dr. Irving Shows, left sided diverticula and cecal polyp, path unknown  . COLONOSCOPY  10/29/2011   Procedure: COLONOSCOPY;  Surgeon: Daneil Dolin, MD;  [facility];  Service: Endoscopy;  Laterality: N/A;   10:15  . ESOPHAGOGASTRODUODENOSCOPY  11/2002   Dr. Gala Romney, erosive reflux esophagitis, multiple gastric ulcer and antral/bulbar erosions. Serologies positive for H.Pylori and was treated  . ESOPHAGOGASTRODUODENOSCOPY  11/20014   Dr. Gala Romney, small hh only, ulcers healed  . ESOPHAGOGASTRODUODENOSCOPY  09/21/2011   Dr Trevor Iha [facility], antral erosions, ?early GAVE  . FISTULOGRAM Left 12/10/2016   Procedure: THROMBECTOMY OF LEFT ARM ARTERIOVENOUS FISTULA;  Surgeon: Waynetta Sandy, MD;  Location: [place];  Service: Vascular;  Laterality: Left;  . INSERTION OF DIALYSIS CATHETER Left 12/10/2016   Procedure: INSERTION OF TUNNELED DIALYSIS CATHETER;  Surgeon: Waynetta Sandy, MD;  Location: [place];  Service: Vascular;  Laterality: Left;  . INSERTION OF DIALYSIS CATHETER Right 06/09/2018   Procedure: INSERTION OF DIALYSIS CATHETER Right subclavian;  Surgeon: Angelia Mould, MD;  Location: [place];  Service: Vascular;  Laterality: Right;  . IR DIALY SHUNT INTRO [place] W/IMG RIGHT Right 01/01/2018  . IR GENERIC HISTORICAL  07/16/2016   IR REMOVAL TUN CV CATH W/O FL 07/16/2016 Saverio Danker, PA-C [facility]-INTERV RAD  . IR PTA ADDL CENTRAL DIALYSIS SEG THRU DIALY CIRCUIT RIGHT Right 10/21/2017  . IR REMOVAL TUN CV CATH W/O FL  05/12/2017  . IR THROMBECTOMY AV FISTULA W/THROMBOLYSIS/PTA INC/SHUNT/IMG RIGHT Right 10/21/2017  .  IR THROMBECTOMY AV FISTULA W/THROMBOLYSIS/PTA INC/SHUNT/IMG RIGHT Right 11/27/2017  . IR US GUIDE VASC ACCESS RIGHT  10/21/2017  . IR US GUIDE VASC ACCESS RIGHT  11/27/2017  . IR US GUIDE VASC ACCESS RIGHT  01/01/2018  . LIGATION ARTERIOVENOUS GORTEX GRAFT Right 06/09/2018  . LIGATION ARTERIOVENOUS GORTEX GRAFT Right 06/09/2018   Procedure: LIGATION ARTERIOVENOUS GORTEX GRAFT RIGHT ARM;  Surgeon: Angelia Mould, MD;  Location: [place];  Service: Vascular;  Laterality: Right;  . LIGATION ARTERIOVENOUS GORTEX GRAFT Left 12/31/2018   Procedure: LIGATION OF LEFT UPPER  ARM ARTERIVENOUS GORTEX GRAFT, LEFT BRACHIAL ARTERY ENDARTERECTOMY WITH BOVINE PATCH ANGIOPLASTY;  Surgeon: Waynetta Sandy, MD;  Location: [place];  Service: Vascular;  Laterality: Left;  . LIGATION OF ARTERIOVENOUS  FISTULA Left 12/19/2015   Procedure: LIGATION OF RADIOCEPHALIC ARTERIOVENOUS  FISTULA;  Surgeon: Conrad [place]man, MD;  Location: [place];  Service: Vascular;  Laterality: Left;  Marland Kitchen MASS EXCISION Right 02/18/2017   Procedure: EXCISION OF RIGHT AXILLARY EPIDERMAL INCLUSION CYST;  Surgeon: Conrad Licking, MD;  Location: [place];  Service: Vascular;  Laterality: Right;  . PERIPHERAL VASCULAR CATHETERIZATION N/A 12/14/2015   Procedure: Fistulagram;  Surgeon: Conrad [place], MD;  Location: [place] CV LAB;  Service: Cardiovascular;  Laterality: N/A;  . SHOULDER SURGERY Right    fracture  . TEE WITHOUT CARDIOVERSION N/A 08/16/2019   Procedure: TRANSESOPHAGEAL ECHOCARDIOGRAM (TEE);  Surgeon: Elouise Munroe, MD;  Location: [place];  Service: Cardiovascular;  Laterality: N/A;  . ULTRASOUND GUIDANCE FOR VASCULAR ACCESS  04/15/2017   Procedure: Ultrasound Guidance For Vascular Access;  Surgeon: Angelia Mould, MD;  Location: [place] CV LAB;  Service: Cardiovascular;;  . UPPER EXTREMITY VENOGRAPHY Bilateral 12/17/2016   Procedure: Bilateral Upper Extremity Venography;  Surgeon: Serafina Mitchell, MD;  Location: [place] CV LAB;  Service: Cardiovascular;  Laterality: Bilateral;  . UPPER EXTREMITY VENOGRAPHY Left 11/03/2018   Procedure: UPPER EXTREMITY VENOGRAPHY;  Surgeon: Serafina Mitchell, MD;  Location: [place] CV LAB;  Service: Cardiovascular;  Laterality: Left;    Social History  reports that he quit smoking about 15 years ago. He quit after 25.00 years of use. He quit smokeless tobacco use about 32 years ago.  His smokeless tobacco use included chew. He reports that he does not drink alcohol or use drugs.  Allergies  Allergen Reactions  . Aspirin Other (See Comments)     Causes internal bleeding  History of ulcers    Family History  Problem Relation Age of Onset  . Hypertension Mother   . Colon cancer Neg Hx   . Liver disease Neg Hx      Prior to Admission medications   Medication Sig Start Date End Date Taking? Authorizing Provider  Amino Acids-Protein Hydrolys (FEEDING SUPPLEMENT, PRO-STAT SUGAR FREE 64,) LIQD Take 30 mLs by mouth in the morning and at bedtime. (0900 & 2100)   Yes [provider]  atorvastatin (LIPITOR) 40 MG tablet Take 40 mg by mouth daily. (0900) 11/03/15  Yes [provider]  B Complex-C (B-COMPLEX WITH VITAMIN C) tablet Take 1 tablet by mouth daily. (0900)   Yes [provider]  cholecalciferol (VITAMIN D3) 25 MCG (1000 UT) tablet Take 1,000 Units by mouth daily. (0900)   Yes [provider]  cinacalcet (SENSIPAR) 60 MG tablet Take 60 mg by mouth daily at 10 pm. (2100)   Yes [provider]  clopidogrel (PLAVIX) 75 MG tablet Take 75 mg by mouth daily. (0900)  05/24/19  Yes [provider]  magnesium hydroxide (MILK OF MAGNESIA) 400 MG/5ML suspension Take 30 mLs by mouth 2 (two) times daily as needed (constipation).   Yes [provider]  melatonin 3 MG TABS tablet Take 3 mg by mouth at bedtime. (2200)   Yes [provider]  midodrine (PROAMATINE) 10 MG tablet Take 1 tablet (10 mg total) by mouth 3 (three) times daily. Patient taking differently: Take 10 mg by mouth 3 (three) times daily. (0900, 1300 & 1700) 08/20/19 09/19/19 Yes Elodia Florence., MD  mirtazapine (REMERON) 7.5 MG tablet Take 7.5 mg by mouth at bedtime. (2200)   Yes [provider]  Nutritional Supplements (FEEDING SUPPLEMENT, NEPRO CARB STEADY,) LIQD Take 237 mLs by mouth 2 (two) times daily between meals. Patient taking differently: Take 237 mLs by mouth 2 (two) times daily between meals. (0800 & 2100) 07/22/19  Yes Persons, Bevely Palmer, PA  oxyCODONE (OXY IR/ROXICODONE) 5 MG immediate  release tablet Take 5 mg by mouth every 4 (four) hours as needed (mild to moderate pain).   Yes [provider]  paricalcitol (ZEMPLAR) 2 MCG capsule Take 2 mcg by mouth See admin instructions. Take 1 capsule (2 mcg) by mouth 3 times daily on Mondays, Wednesdays, & Fridays at 1200, 1700, & 2200.   Yes [provider]  saccharomyces boulardii (FLORASTOR) 250 MG capsule Take 250 mg by mouth 2 (two) times daily. (0800 & 2000)   Yes [provider]  sennosides-docusate sodium (SENOKOT-S) 8.6-50 MG tablet Take 1 tablet by mouth daily. (0800)   Yes [provider]  vitamin C (ASCORBIC ACID) 500 MG tablet Take 500 mg by mouth every evening. (1700)   Yes [provider]  doxercalciferol ([name]) 2.5 MCG capsule Take 1 capsule (2.5 mcg total) by mouth every Monday, Wednesday, and Friday with hemodialysis. Patient not taking: Reported on 09/02/2019 08/20/19 09/19/19  Elodia Florence., MD    Physical Exam: Vitals:   09/03/19 0836  BP: (!) 138/51  Pulse: 86  Temp: 97.8 F (36.6 C)  SpO2: 100%  Weight: 65.8 kg  Height: 5\' 5"  (1.651 m)      Constitutional: Elderly male, moderately built, frail and chronically ill looking lying comfortably propped up in bed. Eyes: PERTLA, lids and conjunctivae normal ENMT: Mucous membranes are moist. Posterior pharynx clear of any exudate or lesions.  Edentulous and difficult to understand his speech. Neck: supple, no masses, no thyromegaly Respiratory: Clear to auscultation without wheezing, rhonchi or crackles. No increased work of breathing. Cardiovascular: S1 & S2 heard, regular rate and rhythm. No JVD, rubs or clicks. No pedal edema.  Systolic ejection murmur 2/6 best heard at apex. Abdomen: Non distended. Non tender. Soft. No organomegaly or masses appreciated. No clinical Ascites. Normal bowel sounds heard.  Per rectal exam done by GI: Large amount of soft stool mixed with bright red blood in  vault. Musculoskeletal: no clubbing / cyanosis. No joint deformity upper and lower extremities. Good ROM, no contractures. Normal muscle tone.  Right middle finger amputated.  Right transmetatarsal amputation, healed.  Left transtibial amputation site with staples still in place, multiple areas of gangrene and necrosis at different stages but no erythema, drainage or foul smell.  Appears to have bilateral upper extremity failed AV graft for HD.  Left groin AV graft with good thrill. Skin: As noted above. Neurologic: CN 2-12 grossly intact. Sensation intact, DTR normal. Strength 5/5 in all 4 limbs.  Psychiatric: Impaired judgment and insight.  Alert and oriented x 2. Normal mood.     Labs on Admission:   No labs present on admission.  Requested labs which will be drawn after central line has been placed.    Assessment/Plan Principal Problem:   Acute GI bleeding Active Problems:   ESRD on dialysis [facility])   Aortic stenosis   PVD (peripheral vascular disease) ([place])   Essential hypertension     Acute lower GI bleed/painless hematochezia: Patient does have history of gastric ulcers, H. pylori infection, has been on Plavix which may not have been held prior to today's scheduled orthopedic procedure, has been taking ibuprofen PTA but this does not appear to be an upper GI bleed.  Does have history of colonic polyps.  DD: Diverticular bleed/stercoral ulcer/AVMs/malignancy versus others.  Hemodynamically stable.  Follow CBC and trend.  Transfuse as needed for hemoglobin <8 g per DL.  Hold Plavix.  Type and screen.  [facility] had orthopedics postpone the scheduled orthopedic surgery.  [place] consultation appreciated.  I discussed with them multiple times.  Plan for clear liquids today, n.p.o. after midnight tonight for possible EGD and colonoscopy.  Patient is high risk given history of severe aortic stenosis and multiple other medical problems as discussed above.  I discussed this with GI PA and  defer to them regarding obtaining a preop cardiac clearance.  Would avoid hypotension periprocedure.  No heparin products across dialysis.  ESRD on MWF HD: I discussed with Dr. Burnett Sheng and consulted Nephrology for dialysis needs.  Difficult vascular access: Unable to draw labs in preop area or get peripheral IV access there.  I consulted both PCCM and IR for central line, whoever can do it earlier.  It appears that CCM was able to place a central line.  Chronic diastolic CHF: Appears compensated.  Severe aortic stenosis: As noted by TEE on previous admission.  Avoid hypotension.  Hypertension/chronic hypotension: Resume midodrine if still taking it after medications have been reconciled by pharmacy.  Iron deficiency anemia/anemia of ESRD: Follow CBC and transfuse as noted above.  History of CVA: Hold Plavix due to GI bleed.  Gangrene/necrotic changes of left transtibial stump: Await orthopedic follow-up to determine if this will be addressed later on this admission after GI bleed issues have been resolved or will need to be deferred to outpatient follow-up.  Addendum:  Reviewed labs which were late in coming: CMP significant for BUN 39, creatinine 5.7, albumin 1.7, total protein 6.4.  Anemia panel: Iron 14, TIBC 132, saturation ratio 11, ferritin 371.  CBC: Hemoglobin 8.2, down from 11.8 on 4/23.  WBC 17.9.  DVT prophylaxis: SCDs Code Status: Full as discussed with patient. Family Communication: None at bedside. Disposition Plan:   Patient is from:  SNF.  Anticipated DC to:  SNF.  Anticipated DC date:  To be determined pending clinical improvement and evaluation  Anticipated DC barriers: Active lower GI bleed   Consults called: [facility], PCCM, IR Admission status: Inpatient/progressive care unit  Severity of Illness: The appropriate patient status for this patient is INPATIENT. Inpatient status is judged to be reasonable and necessary in order to provide the required intensity  of service to ensure the patient's safety. The patient's presenting symptoms, physical exam findings, and initial radiographic and laboratory data in the context of their chronic comorbidities is felt to place them at high risk for further clinical deterioration. Furthermore, it is not anticipated that the patient will be medically stable for discharge from the hospital within 2 midnights of  admission. The following factors support the patient status of inpatient.   " The patient's presenting symptoms include rectal bleeding. " The worrisome physical exam findings include multiple amputation sites as noted above. " The initial radiographic and laboratory data are worrisome because of unable to get labs thus far due to poor access. " The chronic co-morbidities include ESRD on HD, severe aortic stenosis.   * I certify that at the point of admission it is my clinical judgment that the patient will require inpatient hospital care spanning beyond 2 midnights from the point of admission due to high intensity of service, high risk for further deterioration and high frequency of surveillance required.Vernell Leep MD [facility]  To contact the attending provider between 7A-7P or the covering provider during after hours 7P-7A, please log into the web site [email or web address] and access using universal [place] password for that web site. If you do not have the password, please call the hospital operator.  09/03/2019, 1:04 PM

## 2019-09-03 NOTE — Progress Notes (Signed)
Patient has resided at [facility] for the past few months. CSW will continue to follow.   Tacey Dimaggio LCSW

## 2019-09-03 NOTE — H&P (Signed)
Joseph THERIEN Sr. is an 79 y.o. male.   Chief Complaint: Left Abduction Wound Dehiscence HPI: Patient is a 79 year old gentleman with peripheral vascular disease diabetes end-stage renal disease on dialysis Monday Wednesday Friday who is status post multiple finger amputations and a transmetatarsal amputation of the right he is status post a left transtibial amputation 3 weeks ago and patient has had progressive gangrenous necrotic changes with stump dehiscence.  Past Medical History:  Diagnosis Date  . Acute osteomyelitis of left foot ([place]) 07/21/2019  . Anxiety   . Aortic stenosis   . Arthritis   . Closed avulsion fracture of right hip ([place]) 06/30/2019  . ESRD (end stage renal disease) on dialysis [facility])    M/W/F at [facility] in [place]  . Essential hypertension   . Gangrene of left foot ([place]) 06/04/2018  . Gangrene of right foot ([place])   . Gastric ulcer 2004  . GI bleed   . History of blood transfusion   . History of cardiomyopathy    LVEF normal as of February 2017  . History of gastric ulcer   . History of stroke    Left side weakness  . Iron deficiency anemia   . Osteomyelitis ([place])   . Peripheral vascular disease [facility])     Past Surgical History:  Procedure Laterality Date  . ABDOMINAL AORTAGRAM N/A 01/24/2012   Procedure: ABDOMINAL Maxcine Ham;  Surgeon: Elam Dutch, MD;  Location: [facility] CATH LAB;  Service: Cardiovascular;  Laterality: N/A;  . ABDOMINAL AORTOGRAM W/LOWER EXTREMITY N/A 04/15/2017   Procedure: ABDOMINAL AORTOGRAM W/LOWER EXTREMITY;  Surgeon: Angelia Mould, MD;  Location: [place] CV LAB;  Service: Cardiovascular;  Laterality: N/A;  . AMPUTATION Right 05/09/2017   Procedure: RIGHT TRANSMETATARSAL AMPUTATION;  Surgeon: [name], MD;  Location: [place];  Service: Orthopedics;  Laterality: Right;  . AMPUTATION Right 06/13/2018   Procedure: AMPUTATION RIGHT LONG FINGER;  Surgeon: Dayna Barker, MD;  Location: [place];  Service: Plastics;  Laterality: Right;   . AMPUTATION Left 07/21/2019   Procedure: LEFT TRANSMETATARSAL AMPUTATION;  Surgeon: [name], MD;  Location: [place];  Service: Orthopedics;  Laterality: Left;  . AMPUTATION Left 08/13/2019   Procedure: AMPUTATION BELOW KNEE;  Surgeon: [name], MD;  Location: [place];  Service: Orthopedics;  Laterality: Left;  . ARTERIOVENOUS GRAFT PLACEMENT Right right arm  . AV FISTULA PLACEMENT Left 08/31/2015   Procedure: ARTERIOVENOUS (AV) FISTULA CREATION- LEFT RADIOCEPHALIC;  Surgeon: Mal Misty, MD;  Location: [place];  Service: Vascular;  Laterality: Left;  . AV FISTULA PLACEMENT Right 02/18/2017   Procedure: INSERTION OF ARTERIOVENOUS (AV) GORE-TEX GRAFT  RIGHT UPPER ARM;  Surgeon: Conrad [place], MD;  Location: [place];  Service: Vascular;  Laterality: Right;  . AV FISTULA PLACEMENT Left 12/01/2018   Procedure: INSERTION OF ARTERIOVENOUS (AV) GORE-TEX GRAFT ARM;  Surgeon: Angelia Mould, MD;  Location: [place];  Service: Vascular;  Laterality: Left;  . AV FISTULA PLACEMENT Left 02/09/2019   Procedure: INSERTION OF ARTERIOVENOUS (AV) GORE-TEX GRAFT LEFT THIGH;  Surgeon: Waynetta Sandy, MD;  Location: [place];  Service: Vascular;  Laterality: Left;  . BASCILIC VEIN TRANSPOSITION Left 12/19/2015   Procedure: FIRST STAGE BRACHIAL VEIN TRANSPOSITION;  Surgeon: Conrad Linwood, MD;  Location: [place];  Service: Vascular;  Laterality: Left;  . BASCILIC VEIN TRANSPOSITION Left 02/08/2016   Procedure: SECOND STAGE BRACHIAL VEIN TRANSPOSITION;  Surgeon: Conrad Sanborn, MD;  Location: [place];  Service: Vascular;  Laterality: Left;  . BUBBLE STUDY  08/16/2019   Procedure: BUBBLE STUDY;  Surgeon: Elouise Munroe, MD;  Location: [place];  Service: Cardiovascular;;  . CARDIAC CATHETERIZATION N/A 07/11/2015   Procedure: Left Heart Cath and Coronary Angiography;  Surgeon: Troy Sine, MD;  Location: [place] CV LAB;  Service: Cardiovascular;  Laterality: N/A;  . Carpel Tunnel Left Dec. 22, 2016   . CHOLECYSTECTOMY    . COLONOSCOPY  2004   Dr. Irving Shows, left sided diverticula and cecal polyp, path unknown  . COLONOSCOPY  10/29/2011   Procedure: COLONOSCOPY;  Surgeon: Daneil Dolin, MD;  [facility];  Service: Endoscopy;  Laterality: N/A;  10:15  . ESOPHAGOGASTRODUODENOSCOPY  11/2002   Dr. Gala Romney, erosive reflux esophagitis, multiple gastric ulcer and antral/bulbar erosions. Serologies positive for H.Pylori and was treated  . ESOPHAGOGASTRODUODENOSCOPY  11/20014   Dr. Gala Romney, small hh only, ulcers healed  . ESOPHAGOGASTRODUODENOSCOPY  09/21/2011   Dr Trevor Iha [facility], antral erosions, ?early GAVE  . FISTULOGRAM Left 12/10/2016   Procedure: THROMBECTOMY OF LEFT ARM ARTERIOVENOUS FISTULA;  Surgeon: Waynetta Sandy, MD;  Location: [place];  Service: Vascular;  Laterality: Left;  . INSERTION OF DIALYSIS CATHETER Left 12/10/2016   Procedure: INSERTION OF TUNNELED DIALYSIS CATHETER;  Surgeon: Waynetta Sandy, MD;  Location: [place];  Service: Vascular;  Laterality: Left;  . INSERTION OF DIALYSIS CATHETER Right 06/09/2018   Procedure: INSERTION OF DIALYSIS CATHETER Right subclavian;  Surgeon: Angelia Mould, MD;  Location: [place];  Service: Vascular;  Laterality: Right;  . IR DIALY SHUNT INTRO [place] W/IMG RIGHT Right 01/01/2018  . IR GENERIC HISTORICAL  07/16/2016   IR REMOVAL TUN CV CATH W/O FL 07/16/2016 Saverio Danker, PA-C [facility]-INTERV RAD  . IR PTA ADDL CENTRAL DIALYSIS SEG THRU DIALY CIRCUIT RIGHT Right 10/21/2017  . IR REMOVAL TUN CV CATH W/O FL  05/12/2017  . IR THROMBECTOMY AV FISTULA W/THROMBOLYSIS/PTA INC/SHUNT/IMG RIGHT Right 10/21/2017  . IR THROMBECTOMY AV FISTULA W/THROMBOLYSIS/PTA INC/SHUNT/IMG RIGHT Right 11/27/2017  . IR US GUIDE VASC ACCESS RIGHT  10/21/2017  . IR US GUIDE VASC ACCESS RIGHT  11/27/2017  . IR US GUIDE VASC ACCESS RIGHT  01/01/2018  . LIGATION ARTERIOVENOUS GORTEX GRAFT Right 06/09/2018  . LIGATION ARTERIOVENOUS GORTEX GRAFT  Right 06/09/2018   Procedure: LIGATION ARTERIOVENOUS GORTEX GRAFT RIGHT ARM;  Surgeon: Angelia Mould, MD;  Location: [place];  Service: Vascular;  Laterality: Right;  . LIGATION ARTERIOVENOUS GORTEX GRAFT Left 12/31/2018   Procedure: LIGATION OF LEFT UPPER ARM ARTERIVENOUS GORTEX GRAFT, LEFT BRACHIAL ARTERY ENDARTERECTOMY WITH BOVINE PATCH ANGIOPLASTY;  Surgeon: Waynetta Sandy, MD;  Location: [place];  Service: Vascular;  Laterality: Left;  . LIGATION OF ARTERIOVENOUS  FISTULA Left 12/19/2015   Procedure: LIGATION OF RADIOCEPHALIC ARTERIOVENOUS  FISTULA;  Surgeon: Conrad [place], MD;  Location: [place];  Service: Vascular;  Laterality: Left;  Marland Kitchen MASS EXCISION Right 02/18/2017   Procedure: EXCISION OF RIGHT AXILLARY EPIDERMAL INCLUSION CYST;  Surgeon: Conrad [place], MD;  Location: [place];  Service: Vascular;  Laterality: Right;  . PERIPHERAL VASCULAR CATHETERIZATION N/A 12/14/2015   Procedure: Fistulagram;  Surgeon: Conrad [place]aston, MD;  Location: [place] CV LAB;  Service: Cardiovascular;  Laterality: N/A;  . SHOULDER SURGERY Right    fracture  . TEE WITHOUT CARDIOVERSION N/A 08/16/2019   Procedure: TRANSESOPHAGEAL ECHOCARDIOGRAM (TEE);  Surgeon: Elouise Munroe, MD;  Location: [place];  Service: Cardiovascular;  Laterality: N/A;  . ULTRASOUND GUIDANCE  FOR VASCULAR ACCESS  04/15/2017   Procedure: Ultrasound Guidance For Vascular Access;  Surgeon: Angelia Mould, MD;  Location: [place] CV LAB;  Service: Cardiovascular;;  . UPPER EXTREMITY VENOGRAPHY Bilateral 12/17/2016   Procedure: Bilateral Upper Extremity Venography;  Surgeon: Serafina Mitchell, MD;  Location: [place] CV LAB;  Service: Cardiovascular;  Laterality: Bilateral;  . UPPER EXTREMITY VENOGRAPHY Left 11/03/2018   Procedure: UPPER EXTREMITY VENOGRAPHY;  Surgeon: Serafina Mitchell, MD;  Location: [place] CV LAB;  Service: Cardiovascular;  Laterality: Left;    Family History  Problem Relation Age of Onset  .  Hypertension Mother   . Colon cancer Neg Hx   . Liver disease Neg Hx    Social History:  reports that he quit smoking about 15 years ago. He quit after 25.00 years of use. He quit smokeless tobacco use about 32 years ago.  His smokeless tobacco use included chew. He reports that he does not drink alcohol or use drugs.  Allergies:  Allergies  Allergen Reactions  . Aspirin Other (See Comments)    Causes internal bleeding  History of ulcers    No medications prior to admission.    No results found for this or any previous visit (from the past 48 hour(s)). No results found.  Review of Systems  All other systems reviewed and are negative.   There were no vitals taken for this visit. Physical Exam  Patient is alert, oriented, no adenopathy, well-dressed, normal affect, normal respiratory effort. Examination patient has circumferential gangrenous changes of the left below the knee amputation.  The anterior compartment is necrotic and draining.  There is no ascending cellulitis.  The ischemic changes are circumferential. Heart RRR Lungs Clear Assessment/Plan 1. PVD (peripheral vascular disease) ([place])   2. Atherosclerosis of native arteries of extremities with gangrene, left leg (HCC)   3. Dehiscence of amputation stump ([place])     Plan: Patient does not have any further limb salvage intervention options we will need to proceed with an above-the-knee amputation tomorrow.  Anticipate patient can return back to skilled nursing we will proceed with dialysis after surgery.  Risk and benefits were again discussed with the risk of the wound not healing.  Bevely Palmer Doy Taaffe, PA 09/03/2019, 6:45 AM

## 2019-09-03 NOTE — Progress Notes (Signed)
Full report given to Jackelyn Poling, RN on [phone].  Patient is currently having central line placed then will be transferred.

## 2019-09-03 NOTE — NC FL2 (Signed)
[place] LEVEL OF CARE SCREENING TOOL     IDENTIFICATION  Patient Name: [name]. Birthdate: Dec 30, 1940 Sex: male Admission Date (Current Location): 09/03/2019  [facility] and [place] Number:  [employer] and Address:  [facility] [place], [place] [address], [place], [place] [address]      Provider Number: [id number]  Attending Physician Name and Address:  Modena Jansky, MD  Relative Name and Phone Number:  [name], daughter, [phone]    Current Level of Care: Hospital Recommended Level of Care: [place] Prior Approval Number:    Date Approved/Denied:   PASRR Number: [id number]  Discharge Plan: SNF    Current Diagnoses: Patient Active Problem List   Diagnosis Date Noted  . Acute GI bleeding 09/03/2019  . Bacteremia due to Enterococcus 08/11/2019  . Postoperative wound infection 08/10/2019  . Prolonged QT interval 08/10/2019  . Acute metabolic encephalopathy 16/60/6301  . Thrombocytosis ([place]) 08/10/2019  . Fall   . Closed intertrochanteric fracture of right hip ([place]) 06/30/2019  . Hip fracture ([place]) 06/30/2019  . Severe protein-calorie malnutrition ([place]) 06/18/2018  . Severe sepsis ([place]) 06/12/2018  . Leukocytosis 06/12/2018  . MRSA (methicillin resistant staph aureus) culture positive 06/12/2018  . Essential hypertension 06/12/2018  . S/P transmetatarsal amputation of foot, right ([place]) 05/09/2017  . PVD (peripheral vascular disease) ([place]) 04/15/2017  . Nonischemic cardiomyopathy ([place]) 08/15/2015  . Aortic stenosis 08/15/2015  . Moderate aortic stenosis 07/12/2015  . 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 CAD- 40-50% LAD at cath 07/11/15 07/11/2015  . Aspirin intolerance 07/11/2015  . Abnormal stress test   . ESRD on dialysis ([place]) 05/30/2015  . CTS (carpal tunnel syndrome) 02/28/2015  . PVD of LE - Dr Oneida Alar follows 08/20/2012  . Midfoot skin ulcer, right, limited to breakdown of  skin ([place]) 01/16/2012  . Encounter for central line placement 10/08/2011  . Melena 10/08/2011  . Other complications due to renal dialysis device, implant, and graft 09/24/2011    Orientation RESPIRATION BLADDER Height & Weight     Self, Time, Situation, Place  Normal Continent Weight: 130 lb 8.2 oz (59.2 kg) Height:  5' 6"  (167.6 cm)  BEHAVIORAL SYMPTOMS/MOOD NEUROLOGICAL BOWEL NUTRITION STATUS      Incontinent Diet(Please see DC Summary)  AMBULATORY STATUS COMMUNICATION OF NEEDS Skin   Extensive Assist Verbally PU Stage and Appropriate Care, Surgical wounds(Pressure injury on sacrum;closed incision on leg and thigh) PU Stage 1 Dressing: (sacrum, foam dressing; lift every shift to assess, change every 3 days)                     Personal Care Assistance Level of Assistance  Bathing, Feeding, Dressing Bathing Assistance: Maximum assistance Feeding assistance: Independent Dressing Assistance: Maximum assistance     Functional Limitations Info  Speech     Speech Info: Impaired    SPECIAL CARE FACTORS FREQUENCY  PT (By licensed PT), OT (By licensed OT)     PT Frequency: 5x/week OT Frequency: 5x/week            Contractures Contractures Info: Not present    Additional Factors Info  Code Status, Allergies, Isolation Precautions Code Status Info: Full Allergies Info: Aspirin     Isolation Precautions Info: MRSA on 3/24     Current Medications (09/03/2019):  This is the current hospital active medication list Current Facility-Administered Medications  Medication Dose Route Frequency Provider Last Rate Last Admin  . 0.9 %  sodium chloride infusion  100 mL Intravenous PRN Alric Seton, PA-C      . 0.9 %  sodium chloride infusion  100 mL Intravenous PRN Alric Seton, PA-C      . [START ON 09/04/2019] Chlorhexidine Gluconate Cloth 2 % PADS 6 each  6 each Topical Q0600 Penninger, Ria Comment, PA      . midodrine (PROAMATINE) tablet 10 mg  10 mg Oral TID WC Penninger,  Lindsay, PA      . peg 3350 powder (MOVIPREP) kit 100 g  0.5 kit Oral Once Skeet Simmer, [facility]       And  . Derrill Memo ON 09/04/2019] peg 3350 powder (MOVIPREP) kit 100 g  0.5 kit Oral Once Skeet Simmer, [facility]         Discharge Medications: Please see discharge summary for a list of discharge medications.  Relevant Imaging Results:  Relevant Lab Results:   Additional Information SS#: [id number]; HD TTS at [facility] in [place];            COVID negative on 09/03/19  Benard Halsted, LCSW

## 2019-09-03 NOTE — Progress Notes (Signed)
Dr. Sharol Given and Dr. Glennon Mac made aware that surgery is being cancelled due to GI bleed.

## 2019-09-03 NOTE — Procedures (Signed)
Central Venous Catheter Insertion Procedure Note [name] [id number] 03-13-1941  Procedure: Insertion of Central Venous Catheter Indications: Assessment of intravascular volume, Drug and/or fluid administration and Frequent blood sampling  Procedure Details Consent: Unable to obtain consent because of emergent medical necessity. Time Out: Verified patient identification, verified procedure, site/side was marked, verified correct patient position, special equipment/implants available, medications/allergies/relevent history reviewed, required imaging and test results available.  Performed  Maximum sterile technique was used including antiseptics, cap, gloves, gown, hand hygiene, mask and sheet. Skin prep: Chlorhexidine; local anesthetic administered A antimicrobial bonded/coated triple lumen catheter was placed in the left internal jugular vein using the Seldinger technique.  Evaluation Blood flow good Complications: No apparent complications Patient did tolerate procedure well. Chest X-ray ordered to verify placement.  CXR: pending.  Procedure performed under direct ultrasound guidance for real time vessel cannulation.      Montey Hora, [facility] 09/03/2019, 1:18 PM

## 2019-09-03 NOTE — H&P (View-Only) (Signed)
Referring Provider:  [facility]         Primary Care Physician:  [name], MD Primary Gastroenterologist:   Dr. Gala Romney (hospital consult May 2013)       We were asked to see this patient for:   Rectal bleeding               ASSESSMENT /  PLAN   79 year old male with ESRD on HD, history of colon polyps, history of gastric ulcers, history of H. pylori infection, diastolic CHF, PVD on Plavix history of multiple amputations, history of CVA, hypertension, severe aortic stenosis  #Painless hematochezia --This could be a diverticular hemorrhage.  He has a large amount of soft stool on DRE, could have a stercoral ulcer. Colonic AVMs? Neoplasm not excluded.  --Hemodynamically stable at present --Large amount of soft stool mixed with red blood in vault. Red blood / clots in diaper --Recommend postponement of orthopedic surgery today as etiology of bleeding unknown and clinical course is unpredictable --I spoke with Bevely Palmer, Dr. Jess Barters PA and recommended he be admitted to the hospital for further evaluation / management of bleeding.  --I subsequently spoke with [facility], Dr. Algis Liming who will admit patient.  --Trend H+H..  Most recent hgb on 4/23 was 11. We do not have labs for today yet.  Transfuse if needed.  --Sips of clears for now.  --If continues to bleed then may need CTangio ( if okay given ESRD). Otherwise, colonoscopy  # Microcytic anemia.  --check iron studies --Will likely need both EGD and colonoscopy.   # Mildly elevated AST ? From recent amputation?  # History of adenomatous colon polyps 2013 --It appears he never had follow-up surveillance colonoscopy  # Severe PVD ( plavix on hold for surgery)  /diastolic CHF, severe aortic stenosis  HPI:    Chief Complaint: rectal bleeding  Joseph DWORKIN Sr. is a 79 y.o. male with multiple medical problems as listed above.  Patient had left transmetatarsal amputation for gangrenous left foot on 07/21/2019 . He was subsequently  admitted to the hospital late April for evaluation of altered mental status during dialysis.  He was septic due to Enterococcus bacteremia had dehiscence of the amputation.  He subsequently underwent a transtibial amputation on 08/13/2019.  For gangrenous/necrotic changes patient was scheduled today for amputation.   Of note during his recent hospitalization he did have some blood-streaked stools but his hemoglobin remained stable.  He was FOBT + at the time.  Outpatient GI follow-up recommended.  I saw the patient in preop holding after he was found to have rectal bleeding.  Patient says that other than during recent hospital admission when some streaks of blood were found in his stool he has no history of rectal bleeding.  He has no abdominal pain, no nausea or vomiting.  He does normally take Plavix at home but certainly that has been on hold in preparation for surgery.  Patient takes ibuprofen a couple of times a month.  Denies family history colon cancer.  Of note, I found a noncontrast CT scan of the abdomen and pelvis done 05/28/2019 for evaluation of epigastric painl exam remarkable for fluid-filled slightly distended stomach static contents.  No discrete transition point to suggest SBO.  Findings could represent slow transit versus ileus.  Stable exophytic partially calcified lesion in right lobe of liver presumably benign.  Patient was hospitalized May 2013 with melena.  He was evaluated by Dr. Gala Romney  PREVIOUS ENDOSCOPIC EVALUATIONS:  09/21/2011 EGD by  Dr. Gala Romney for melena --Small hiatal hernia --Antral erosions --No active bleeding  GASTRIC ANTRAL TYPE MUCOSA WITH FOVEOLAR HYPERPLASIA AND INTRAMUCOSAL HEMORRHAGE. NO EVIDENCE OF HELICOBACTER PYLORI, INTESTINAL METAPLASIA, DYSPLASIA OR MALIGNANCY.   10/29/11 colonoscopy for history of colon polyps -Dr. Gala Romney --Multiple colon polyps, largest 1 cm. --Pandiverticulosis  Colon, polyp(s), sigmoid - TUBULAR ADENOMA (X1); NEGATIVE FOR HIGH  GRADE DYSPLASIA OR MALIGNANCY. 2. Colon, biopsy, cecal polypoid mucosa - BENIGN LYMPHOID POLYP (X1) 3. Colon, polyp(s), ascending - TUBULAR ADENOMA (X1); NEGATIVE FOR HIGH GRADE DYSPLASIA OR MALIGNANCY. 4. Colon, polyp(s), transverse - TUBULAR ADENOMA (X1); NEGATIVE FOR HIGH GRADE DYSPLASIA OR MALIGNANCY  Past Medical History:  Diagnosis Date  . Acute osteomyelitis of left foot ([place]) 07/21/2019  . Anxiety   . Aortic stenosis   . Arthritis   . Closed avulsion fracture of right hip ([place]) 06/30/2019  . ESRD (end stage renal disease) on dialysis [facility])    M/W/F at [facility] in [place]  . Essential hypertension   . Gangrene of left foot ([place]) 06/04/2018  . Gangrene of right foot ([place])   . Gastric ulcer 2004  . GI bleed   . History of blood transfusion   . History of cardiomyopathy    LVEF normal as of February 2017  . History of gastric ulcer   . History of stroke    Left side weakness  . Iron deficiency anemia   . Osteomyelitis ([place])   . Peripheral vascular disease [facility])     Past Surgical History:  Procedure Laterality Date  . ABDOMINAL AORTAGRAM N/A 01/24/2012   Procedure: ABDOMINAL Maxcine Ham;  Surgeon: Elam Dutch, MD;  Location: [facility] CATH LAB;  Service: Cardiovascular;  Laterality: N/A;  . ABDOMINAL AORTOGRAM W/LOWER EXTREMITY N/A 04/15/2017   Procedure: ABDOMINAL AORTOGRAM W/LOWER EXTREMITY;  Surgeon: Angelia Mould, MD;  Location: [place] CV LAB;  Service: Cardiovascular;  Laterality: N/A;  . AMPUTATION Right 05/09/2017   Procedure: RIGHT TRANSMETATARSAL AMPUTATION;  Surgeon: [name], MD;  Location: [place];  Service: Orthopedics;  Laterality: Right;  . AMPUTATION Right 06/13/2018   Procedure: AMPUTATION RIGHT LONG FINGER;  Surgeon: Dayna Barker, MD;  Location: [place];  Service: Plastics;  Laterality: Right;  . AMPUTATION Left 07/21/2019   Procedure: LEFT TRANSMETATARSAL AMPUTATION;  Surgeon: [name], MD;  Location: [place];  Service: Orthopedics;   Laterality: Left;  . AMPUTATION Left 08/13/2019   Procedure: AMPUTATION BELOW KNEE;  Surgeon: [name], MD;  Location: [place];  Service: Orthopedics;  Laterality: Left;  . ARTERIOVENOUS GRAFT PLACEMENT Right right arm  . AV FISTULA PLACEMENT Left 08/31/2015   Procedure: ARTERIOVENOUS (AV) FISTULA CREATION- LEFT RADIOCEPHALIC;  Surgeon: Mal Misty, MD;  Location: [place];  Service: Vascular;  Laterality: Left;  . AV FISTULA PLACEMENT Right 02/18/2017   Procedure: INSERTION OF ARTERIOVENOUS (AV) GORE-TEX GRAFT  RIGHT UPPER ARM;  Surgeon: Conrad [place]port, MD;  Location: [place];  Service: Vascular;  Laterality: Right;  . AV FISTULA PLACEMENT Left 12/01/2018   Procedure: INSERTION OF ARTERIOVENOUS (AV) GORE-TEX GRAFT ARM;  Surgeon: Angelia Mould, MD;  Location: [place];  Service: Vascular;  Laterality: Left;  . AV FISTULA PLACEMENT Left 02/09/2019   Procedure: INSERTION OF ARTERIOVENOUS (AV) GORE-TEX GRAFT LEFT THIGH;  Surgeon: Waynetta Sandy, MD;  Location: [place];  Service: Vascular;  Laterality: Left;  . BASCILIC VEIN TRANSPOSITION Left 12/19/2015   Procedure: FIRST STAGE BRACHIAL VEIN TRANSPOSITION;  Surgeon: Conrad Quitman, MD;  Location: [facility];  Service: Vascular;  Laterality: Left;  . BASCILIC VEIN TRANSPOSITION Left 02/08/2016   Procedure: SECOND STAGE BRACHIAL VEIN TRANSPOSITION;  Surgeon: Conrad Dendron, MD;  Location: [place];  Service: Vascular;  Laterality: Left;  . BUBBLE STUDY  08/16/2019   Procedure: BUBBLE STUDY;  Surgeon: Elouise Munroe, MD;  Location: [place];  Service: Cardiovascular;;  . CARDIAC CATHETERIZATION N/A 07/11/2015   Procedure: Left Heart Cath and Coronary Angiography;  Surgeon: Troy Sine, MD;  Location: [place] CV LAB;  Service: Cardiovascular;  Laterality: N/A;  . Carpel Tunnel Left Dec. 22, 2016  . CHOLECYSTECTOMY    . COLONOSCOPY  2004   Dr. Irving Shows, left sided diverticula and cecal polyp, path unknown  . COLONOSCOPY  10/29/2011    Procedure: COLONOSCOPY;  Surgeon: Daneil Dolin, MD;  [facility];  Service: Endoscopy;  Laterality: N/A;  10:15  . ESOPHAGOGASTRODUODENOSCOPY  11/2002   Dr. Gala Romney, erosive reflux esophagitis, multiple gastric ulcer and antral/bulbar erosions. Serologies positive for H.Pylori and was treated  . ESOPHAGOGASTRODUODENOSCOPY  11/20014   Dr. Gala Romney, small hh only, ulcers healed  . ESOPHAGOGASTRODUODENOSCOPY  09/21/2011   Dr Trevor Iha [facility], antral erosions, ?early GAVE  . FISTULOGRAM Left 12/10/2016   Procedure: THROMBECTOMY OF LEFT ARM ARTERIOVENOUS FISTULA;  Surgeon: Waynetta Sandy, MD;  Location: [place];  Service: Vascular;  Laterality: Left;  . INSERTION OF DIALYSIS CATHETER Left 12/10/2016   Procedure: INSERTION OF TUNNELED DIALYSIS CATHETER;  Surgeon: Waynetta Sandy, MD;  Location: [place];  Service: Vascular;  Laterality: Left;  . INSERTION OF DIALYSIS CATHETER Right 06/09/2018   Procedure: INSERTION OF DIALYSIS CATHETER Right subclavian;  Surgeon: Angelia Mould, MD;  Location: [place];  Service: Vascular;  Laterality: Right;  . IR DIALY SHUNT INTRO [place] W/IMG RIGHT Right 01/01/2018  . IR GENERIC HISTORICAL  07/16/2016   IR REMOVAL TUN CV CATH W/O FL 07/16/2016 Saverio Danker, PA-C [facility]-INTERV RAD  . IR PTA ADDL CENTRAL DIALYSIS SEG THRU DIALY CIRCUIT RIGHT Right 10/21/2017  . IR REMOVAL TUN CV CATH W/O FL  05/12/2017  . IR THROMBECTOMY AV FISTULA W/THROMBOLYSIS/PTA INC/SHUNT/IMG RIGHT Right 10/21/2017  . IR THROMBECTOMY AV FISTULA W/THROMBOLYSIS/PTA INC/SHUNT/IMG RIGHT Right 11/27/2017  . IR US GUIDE VASC ACCESS RIGHT  10/21/2017  . IR US GUIDE VASC ACCESS RIGHT  11/27/2017  . IR US GUIDE VASC ACCESS RIGHT  01/01/2018  . LIGATION ARTERIOVENOUS GORTEX GRAFT Right 06/09/2018  . LIGATION ARTERIOVENOUS GORTEX GRAFT Right 06/09/2018   Procedure: LIGATION ARTERIOVENOUS GORTEX GRAFT RIGHT ARM;  Surgeon: Angelia Mould, MD;  Location: [place];  Service:  Vascular;  Laterality: Right;  . LIGATION ARTERIOVENOUS GORTEX GRAFT Left 12/31/2018   Procedure: LIGATION OF LEFT UPPER ARM ARTERIVENOUS GORTEX GRAFT, LEFT BRACHIAL ARTERY ENDARTERECTOMY WITH BOVINE PATCH ANGIOPLASTY;  Surgeon: Waynetta Sandy, MD;  Location: [place];  Service: Vascular;  Laterality: Left;  . LIGATION OF ARTERIOVENOUS  FISTULA Left 12/19/2015   Procedure: LIGATION OF RADIOCEPHALIC ARTERIOVENOUS  FISTULA;  Surgeon: Conrad [place], MD;  Location: [place];  Service: Vascular;  Laterality: Left;  Marland Kitchen MASS EXCISION Right 02/18/2017   Procedure: EXCISION OF RIGHT AXILLARY EPIDERMAL INCLUSION CYST;  Surgeon: Conrad Sal[place], MD;  Location: [place];  Service: Vascular;  Laterality: Right;  . PERIPHERAL VASCULAR CATHETERIZATION N/A 12/14/2015   Procedure: Fistulagram;  Surgeon: Conrad [place], MD;  Location: [place] CV LAB;  Service: Cardiovascular;  Laterality: N/A;  . SHOULDER SURGERY Right  fracture  . TEE WITHOUT CARDIOVERSION N/A 08/16/2019   Procedure: TRANSESOPHAGEAL ECHOCARDIOGRAM (TEE);  Surgeon: Elouise Munroe, MD;  Location: [place];  Service: Cardiovascular;  Laterality: N/A;  . ULTRASOUND GUIDANCE FOR VASCULAR ACCESS  04/15/2017   Procedure: Ultrasound Guidance For Vascular Access;  Surgeon: Angelia Mould, MD;  Location: [place] CV LAB;  Service: Cardiovascular;;  . UPPER EXTREMITY VENOGRAPHY Bilateral 12/17/2016   Procedure: Bilateral Upper Extremity Venography;  Surgeon: Serafina Mitchell, MD;  Location: [place] CV LAB;  Service: Cardiovascular;  Laterality: Bilateral;  . UPPER EXTREMITY VENOGRAPHY Left 11/03/2018   Procedure: UPPER EXTREMITY VENOGRAPHY;  Surgeon: Serafina Mitchell, MD;  Location: [place] CV LAB;  Service: Cardiovascular;  Laterality: Left;    Prior to Admission medications   Medication Sig Start Date End Date Taking? Authorizing Provider  Amino Acids-Protein Hydrolys (FEEDING SUPPLEMENT, PRO-STAT SUGAR FREE 64,) LIQD Take 30 mLs  by mouth in the morning and at bedtime. (0900 & 2100)   Yes [provider]  atorvastatin (LIPITOR) 40 MG tablet Take 40 mg by mouth daily. (0900) 11/03/15  Yes [provider]  B Complex-C (B-COMPLEX WITH VITAMIN C) tablet Take 1 tablet by mouth daily. (0900)   Yes [provider]  cholecalciferol (VITAMIN D3) 25 MCG (1000 UT) tablet Take 1,000 Units by mouth daily. (0900)   Yes [provider]  cinacalcet (SENSIPAR) 60 MG tablet Take 60 mg by mouth daily at 10 pm. (2100)   Yes [provider]  clopidogrel (PLAVIX) 75 MG tablet Take 75 mg by mouth daily. (0900) 05/24/19  Yes [provider]  magnesium hydroxide (MILK OF MAGNESIA) 400 MG/5ML suspension Take 30 mLs by mouth 2 (two) times daily as needed (constipation).   Yes [provider]  melatonin 3 MG TABS tablet Take 3 mg by mouth at bedtime. (2200)   Yes [provider]  midodrine (PROAMATINE) 10 MG tablet Take 1 tablet (10 mg total) by mouth 3 (three) times daily. Patient taking differently: Take 10 mg by mouth 3 (three) times daily. (0900, 1300 & 1700) 08/20/19 09/19/19 Yes Elodia Florence., MD  mirtazapine (REMERON) 7.5 MG tablet Take 7.5 mg by mouth at bedtime. (2200)   Yes [provider]  Nutritional Supplements (FEEDING SUPPLEMENT, NEPRO CARB STEADY,) LIQD Take 237 mLs by mouth 2 (two) times daily between meals. Patient taking differently: Take 237 mLs by mouth 2 (two) times daily between meals. (0800 & 2100) 07/22/19  Yes Persons, Bevely Palmer, PA  oxyCODONE (OXY IR/ROXICODONE) 5 MG immediate release tablet Take 5 mg by mouth every 4 (four) hours as needed (mild to moderate pain).   Yes [provider]  paricalcitol (ZEMPLAR) 2 MCG capsule Take 2 mcg by mouth See admin instructions. Take 1 capsule (2 mcg) by mouth 3 times daily on Mondays, Wednesdays, & Fridays at 1200, 1700, & 2200.   Yes [provider]  saccharomyces boulardii (FLORASTOR)  250 MG capsule Take 250 mg by mouth 2 (two) times daily. (0800 & 2000)   Yes [provider]  sennosides-docusate sodium (SENOKOT-S) 8.6-50 MG tablet Take 1 tablet by mouth daily. (0800)   Yes [provider]  vitamin C (ASCORBIC ACID) 500 MG tablet Take 500 mg by mouth every evening. (1700)   Yes [provider]  doxercalciferol ([name]) 2.5 MCG capsule Take 1 capsule (2.5 mcg total) by mouth every Monday, Wednesday, and Friday with hemodialysis. Patient not taking: Reported on 09/02/2019 08/20/19 09/19/19  Florene Glen,  A Clint Lipps., MD    Current Facility-Administered Medications  Medication Dose Route Frequency Provider Last Rate Last Admin  . 0.9 %  sodium chloride infusion  100 mL Intravenous PRN Alric Seton, PA-C      . 0.9 %  sodium chloride infusion  100 mL Intravenous PRN Alric Seton, PA-C      . 0.9 %  sodium chloride infusion   Intravenous Continuous Annye Asa, MD      . alteplase (CATHFLO ACTIVASE) injection 2 mg  2 mg Intracatheter Once PRN Alric Seton, PA-C      . ceFAZolin (ANCEF) IVPB 2g/100 mL premix  2 g Intravenous On Call to OR Persons, Bevely Palmer, PA      . chlorhexidine (PERIDEX) 0.12 % solution 15 mL  15 mL Mouth/Throat Once Annye Asa, MD       Or  . MEDLINE mouth rinse  15 mL Mouth Rinse Once Annye Asa, MD      . heparin injection 1,000 Units  1,000 Units Dialysis PRN Alric Seton, PA-C      . lidocaine (PF) (XYLOCAINE) 1 % injection 5 mL  5 mL Intradermal PRN Alric Seton, PA-C      . lidocaine-prilocaine (EMLA) cream 1 application  1 application Topical PRN Alric Seton, PA-C      . pentafluoroprop-tetrafluoroeth (GEBAUERS) aerosol 1 application  1 application Topical PRN Alric Seton, PA-C        Allergies as of 09/02/2019 - Review Complete 09/02/2019  Allergen Reaction Noted  . Aspirin Other (See Comments) 10/03/2011    Family History  Problem Relation Age of Onset  . Hypertension Mother     . Colon cancer Neg Hx   . Liver disease Neg Hx     Social History   Socioeconomic History  . Marital status: Single    Spouse name: Not on file  . Number of children: 2  . Years of education: Not on file  . Highest education level: Not on file  Occupational History  . Occupation: retired, Licensed conveyancer    Employer: RETIRED  Tobacco Use  . Smoking status: Former Smoker    Years: 25.00    Quit date: 04/29/2004    Years since quitting: 15.3  . Smokeless tobacco: Former Systems developer    Types: Chew    Quit date: 01/16/1987  . Tobacco comment: quit 2006  Substance and Sexual Activity  . Alcohol use: No  . Drug use: No  . Sexual activity: Not on file  Other Topics Concern  . Not on file  Social History Narrative   Lives alone   Daughter 20-min away   Caffeine use: 32oz soda per day   Social Determinants of Health   Financial Resource Strain:   . Difficulty of Paying Living Expenses:   Food Insecurity:   . Worried About Charity fundraiser in the Last Year:   . Arboriculturist in the Last Year:   Transportation Needs:   . Film/video editor (Medical):   Marland Kitchen Lack of Transportation (Non-Medical):   Physical Activity:   . Days of Exercise per Week:   . Minutes of Exercise per Session:   Stress:   . Feeling of Stress :   Social Connections:   . Frequency of Communication with Friends and Family:   . Frequency of Social Gatherings with Friends and Family:   . Attends Religious Services:   . Active Member of Clubs or Organizations:   . Attends Archivist  Meetings:   Marland Kitchen Marital Status:   Intimate Partner Violence:   . Fear of Current or Ex-Partner:   . Emotionally Abused:   Marland Kitchen Physically Abused:   . Sexually Abused:     Review of Systems: All systems reviewed and negative except where noted in HPI.  Physical Exam: Vital signs in last 24 hours: Temp:  [97.8 F (36.6 C)] 97.8 F (36.6 C) (05/07 0836) Pulse Rate:  [86] 86 (05/07 0836) BP: (138)/(51) 138/51 (05/07  0836) SpO2:  [100 %] 100 % (05/07 0836) Weight:  [65.8 kg] 65.8 kg (05/07 0836)   General:   Alert, thin in NAD Psych:  Pleasant, cooperative. Normal mood and affect. Eyes:  Pupils equal, sclera clear, no icterus.   Conjunctiva pink. Ears:  Normal auditory acuity. Nose:  No deformity, discharge,  or lesions. Neck:  Supple; no masses Lungs:  Clear throughout to auscultation.   No wheezes, crackles, or rhonchi.  Heart:  Regular rate and rhythm; no murmurs, no lower extremity edema Abdomen:  Soft, non-distended, nontender, BS active, no palp mass   Rectal:  Large amount of soft stool mixed with bright red blood in vault.  Msk:  Symmetrical without gross deformities. . Neurologic:  Alert and  oriented x4;  grossly normal neurologically. Skin:  Sacral wound  Intake/Output from previous day: No intake/output data recorded. Intake/Output this shift: No intake/output data recorded.  Lab Results: No results for input(s): WBC, HGB, HCT, PLT in the last 72 hours. BMET No results for input(s): NA, K, CL, CO2, GLUCOSE, BUN, CREATININE, CALCIUM in the last 72 hours. LFT No results for input(s): PROT, ALBUMIN, AST, ALT, ALKPHOS, BILITOT, BILIDIR, IBILI in the last 72 hours. PT/INR No results for input(s): LABPROT, INR in the last 72 hours. Hepatitis Panel No results for input(s): HEPBSAG, HCVAB, HEPAIGM, HEPBIGM in the last 72 hours.   . CBC Latest Ref Rng & Units 08/20/2019 08/19/2019 08/18/2019  WBC 4.0 - 10.5 K/uL 34.6(H) 35.5(H) 27.6(H)  Hemoglobin 13.0 - 17.0 g/dL 11.8(L) 11.9(L) 11.0(L)  Hematocrit 39.0 - 52.0 % 35.9(L) 36.3(L) 33.1(L)  Platelets 150 - 400 K/uL 460(H) 398 348    . CMP Latest Ref Rng & Units 08/20/2019 08/19/2019 08/18/2019  Glucose 70 - 99 mg/dL 99 84 96  BUN 8 - 23 mg/dL 23 36(H) 25(H)  Creatinine 0.61 - 1.24 mg/dL 4.63(H) 6.14(H) 5.10(H)  Sodium 135 - 145 mmol/L 134(L) 135 137  Potassium 3.5 - 5.1 mmol/L 4.2 5.0 4.4  Chloride 98 - 111 mmol/L 96(L) 98 101  CO2 22  - 32 mmol/L 26 22 22   Calcium 8.9 - 10.3 mg/dL 8.1(L) 8.4(L) 8.2(L)  Total Protein 6.5 - 8.1 g/dL 6.6 7.1 6.3(L)  Total Bilirubin 0.3 - 1.2 mg/dL 0.7 0.8 0.8  Alkaline Phos 38 - 126 U/L 64 65 58  AST 15 - 41 U/L 69(H) 88(H) 82(H)  ALT 0 - 44 U/L 5 5 <5   Studies/Results: No results found.  Active Problems:   * No active hospital problems. Tye Savoy, NP-C @  09/03/2019, 10:36 AM

## 2019-09-03 NOTE — Consult Note (Addendum)
Referring Provider:  [facility]         Primary Care Physician:  [name], MD Primary Gastroenterologist:   Dr. Gala Romney (hospital consult May 2013)       We were asked to see this patient for:   Rectal bleeding               ASSESSMENT /  PLAN   79 year old male with ESRD on HD, history of colon polyps, history of gastric ulcers, history of H. pylori infection, diastolic CHF, PVD on Plavix history of multiple amputations, history of CVA, hypertension, severe aortic stenosis  #Painless hematochezia --This could be a diverticular hemorrhage.  He has a large amount of soft stool on DRE, could have a stercoral ulcer. Colonic AVMs? Neoplasm not excluded.  --Hemodynamically stable at present --Large amount of soft stool mixed with red blood in vault. Red blood / clots in diaper --Recommend postponement of orthopedic surgery today as etiology of bleeding unknown and clinical course is unpredictable --I spoke with Bevely Palmer, Dr. Jess Barters PA and recommended he be admitted to the hospital for further evaluation / management of bleeding.  --I subsequently spoke with [facility], Dr. Algis Liming who will admit patient.  --Trend H+H..  Most recent hgb on 4/23 was 11. We do not have labs for today yet.  Transfuse if needed.  --Sips of clears for now.  --If continues to bleed then may need CTangio ( if okay given ESRD). Otherwise, colonoscopy  # Microcytic anemia.  --check iron studies --Will likely need both EGD and colonoscopy.   # Mildly elevated AST ? From recent amputation?  # History of adenomatous colon polyps 2013 --It appears he never had follow-up surveillance colonoscopy  # Severe PVD ( plavix on hold for surgery)  /diastolic CHF, severe aortic stenosis  HPI:    Chief Complaint: rectal bleeding  Joseph FALERO Sr. is a 79 y.o. male with multiple medical problems as listed above.  Patient had left transmetatarsal amputation for gangrenous left foot on 07/21/2019 . He was subsequently  admitted to the hospital late April for evaluation of altered mental status during dialysis.  He was septic due to Enterococcus bacteremia had dehiscence of the amputation.  He subsequently underwent a transtibial amputation on 08/13/2019.  For gangrenous/necrotic changes patient was scheduled today for amputation.   Of note during his recent hospitalization he did have some blood-streaked stools but his hemoglobin remained stable.  He was FOBT + at the time.  Outpatient GI follow-up recommended.  I saw the patient in preop holding after he was found to have rectal bleeding.  Patient says that other than during recent hospital admission when some streaks of blood were found in his stool he has no history of rectal bleeding.  He has no abdominal pain, no nausea or vomiting.  He does normally take Plavix at home but certainly that has been on hold in preparation for surgery.  Patient takes ibuprofen a couple of times a month.  Denies family history colon cancer.  Of note, I found a noncontrast CT scan of the abdomen and pelvis done 05/28/2019 for evaluation of epigastric painl exam remarkable for fluid-filled slightly distended stomach static contents.  No discrete transition point to suggest SBO.  Findings could represent slow transit versus ileus.  Stable exophytic partially calcified lesion in right lobe of liver presumably benign.  Patient was hospitalized May 2013 with melena.  He was evaluated by Dr. Gala Romney  PREVIOUS ENDOSCOPIC EVALUATIONS:  09/21/2011 EGD by  Dr. Gala Romney for melena --Small hiatal hernia --Antral erosions --No active bleeding  GASTRIC ANTRAL TYPE MUCOSA WITH FOVEOLAR HYPERPLASIA AND INTRAMUCOSAL HEMORRHAGE. NO EVIDENCE OF HELICOBACTER PYLORI, INTESTINAL METAPLASIA, DYSPLASIA OR MALIGNANCY.   10/29/11 colonoscopy for history of colon polyps -Dr. Gala Romney --Multiple colon polyps, largest 1 cm. --Pandiverticulosis  Colon, polyp(s), sigmoid - TUBULAR ADENOMA (X1); NEGATIVE FOR HIGH  GRADE DYSPLASIA OR MALIGNANCY. 2. Colon, biopsy, cecal polypoid mucosa - BENIGN LYMPHOID POLYP (X1) 3. Colon, polyp(s), ascending - TUBULAR ADENOMA (X1); NEGATIVE FOR HIGH GRADE DYSPLASIA OR MALIGNANCY. 4. Colon, polyp(s), transverse - TUBULAR ADENOMA (X1); NEGATIVE FOR HIGH GRADE DYSPLASIA OR MALIGNANCY  Past Medical History:  Diagnosis Date  . Acute osteomyelitis of left foot ([place]) 07/21/2019  . Anxiety   . Aortic stenosis   . Arthritis   . Closed avulsion fracture of right hip ([place]) 06/30/2019  . ESRD (end stage renal disease) on dialysis [facility])    M/W/F at [facility] in [place]  . Essential hypertension   . Gangrene of left foot ([place]) 06/04/2018  . Gangrene of right foot ([place])   . Gastric ulcer 2004  . GI bleed   . History of blood transfusion   . History of cardiomyopathy    LVEF normal as of February 2017  . History of gastric ulcer   . History of stroke    Left side weakness  . Iron deficiency anemia   . Osteomyelitis ([place])   . Peripheral vascular disease [facility])     Past Surgical History:  Procedure Laterality Date  . ABDOMINAL AORTAGRAM N/A 01/24/2012   Procedure: ABDOMINAL Maxcine Ham;  Surgeon: Elam Dutch, MD;  Location: [facility] CATH LAB;  Service: Cardiovascular;  Laterality: N/A;  . ABDOMINAL AORTOGRAM W/LOWER EXTREMITY N/A 04/15/2017   Procedure: ABDOMINAL AORTOGRAM W/LOWER EXTREMITY;  Surgeon: Angelia Mould, MD;  Location: [place] CV LAB;  Service: Cardiovascular;  Laterality: N/A;  . AMPUTATION Right 05/09/2017   Procedure: RIGHT TRANSMETATARSAL AMPUTATION;  Surgeon: [name], MD;  Location: [place];  Service: Orthopedics;  Laterality: Right;  . AMPUTATION Right 06/13/2018   Procedure: AMPUTATION RIGHT LONG FINGER;  Surgeon: Dayna Barker, MD;  Location: [place];  Service: Plastics;  Laterality: Right;  . AMPUTATION Left 07/21/2019   Procedure: LEFT TRANSMETATARSAL AMPUTATION;  Surgeon: [name], MD;  Location: [place];  Service: Orthopedics;   Laterality: Left;  . AMPUTATION Left 08/13/2019   Procedure: AMPUTATION BELOW KNEE;  Surgeon: [name], MD;  Location: [place];  Service: Orthopedics;  Laterality: Left;  . ARTERIOVENOUS GRAFT PLACEMENT Right right arm  . AV FISTULA PLACEMENT Left 08/31/2015   Procedure: ARTERIOVENOUS (AV) FISTULA CREATION- LEFT RADIOCEPHALIC;  Surgeon: Mal Misty, MD;  Location: [place];  Service: Vascular;  Laterality: Left;  . AV FISTULA PLACEMENT Right 02/18/2017   Procedure: INSERTION OF ARTERIOVENOUS (AV) GORE-TEX GRAFT  RIGHT UPPER ARM;  Surgeon: Conrad [place], MD;  Location: [place];  Service: Vascular;  Laterality: Right;  . AV FISTULA PLACEMENT Left 12/01/2018   Procedure: INSERTION OF ARTERIOVENOUS (AV) GORE-TEX GRAFT ARM;  Surgeon: Angelia Mould, MD;  Location: [place];  Service: Vascular;  Laterality: Left;  . AV FISTULA PLACEMENT Left 02/09/2019   Procedure: INSERTION OF ARTERIOVENOUS (AV) GORE-TEX GRAFT LEFT THIGH;  Surgeon: Waynetta Sandy, MD;  Location: [place];  Service: Vascular;  Laterality: Left;  . BASCILIC VEIN TRANSPOSITION Left 12/19/2015   Procedure: FIRST STAGE BRACHIAL VEIN TRANSPOSITION;  Surgeon: Conrad [place], MD;  Location: [facility]  OR;  Service: Vascular;  Laterality: Left;  . BASCILIC VEIN TRANSPOSITION Left 02/08/2016   Procedure: SECOND STAGE BRACHIAL VEIN TRANSPOSITION;  Surgeon: Conrad [place], MD;  Location: [place];  Service: Vascular;  Laterality: Left;  . BUBBLE STUDY  08/16/2019   Procedure: BUBBLE STUDY;  Surgeon: Elouise Munroe, MD;  Location: [place];  Service: Cardiovascular;;  . CARDIAC CATHETERIZATION N/A 07/11/2015   Procedure: Left Heart Cath and Coronary Angiography;  Surgeon: Troy Sine, MD;  Location: [place] CV LAB;  Service: Cardiovascular;  Laterality: N/A;  . Carpel Tunnel Left Dec. 22, 2016  . CHOLECYSTECTOMY    . COLONOSCOPY  2004   Dr. Irving Shows, left sided diverticula and cecal polyp, path unknown  . COLONOSCOPY  10/29/2011    Procedure: COLONOSCOPY;  Surgeon: Daneil Dolin, MD;  [facility];  Service: Endoscopy;  Laterality: N/A;  10:15  . ESOPHAGOGASTRODUODENOSCOPY  11/2002   Dr. Gala Romney, erosive reflux esophagitis, multiple gastric ulcer and antral/bulbar erosions. Serologies positive for H.Pylori and was treated  . ESOPHAGOGASTRODUODENOSCOPY  11/20014   Dr. Gala Romney, small hh only, ulcers healed  . ESOPHAGOGASTRODUODENOSCOPY  09/21/2011   Dr Trevor Iha [facility], antral erosions, ?early GAVE  . FISTULOGRAM Left 12/10/2016   Procedure: THROMBECTOMY OF LEFT ARM ARTERIOVENOUS FISTULA;  Surgeon: Waynetta Sandy, MD;  Location: [place];  Service: Vascular;  Laterality: Left;  . INSERTION OF DIALYSIS CATHETER Left 12/10/2016   Procedure: INSERTION OF TUNNELED DIALYSIS CATHETER;  Surgeon: Waynetta Sandy, MD;  [place];  Service: Vascular;  Laterality: Left;  . INSERTION OF DIALYSIS CATHETER Right 06/09/2018   Procedure: INSERTION OF DIALYSIS CATHETER Right subclavian;  Surgeon: Angelia Mould, MD;  Location: [place];  Service: Vascular;  Laterality: Right;  . IR DIALY SHUNT INTRO [place] W/IMG RIGHT Right 01/01/2018  . IR GENERIC HISTORICAL  07/16/2016   IR REMOVAL TUN CV CATH W/O FL 07/16/2016 Saverio Danker, PA-C [facility]-INTERV RAD  . IR PTA ADDL CENTRAL DIALYSIS SEG THRU DIALY CIRCUIT RIGHT Right 10/21/2017  . IR REMOVAL TUN CV CATH W/O FL  05/12/2017  . IR THROMBECTOMY AV FISTULA W/THROMBOLYSIS/PTA INC/SHUNT/IMG RIGHT Right 10/21/2017  . IR THROMBECTOMY AV FISTULA W/THROMBOLYSIS/PTA INC/SHUNT/IMG RIGHT Right 11/27/2017  . IR US GUIDE VASC ACCESS RIGHT  10/21/2017  . IR US GUIDE VASC ACCESS RIGHT  11/27/2017  . IR US GUIDE VASC ACCESS RIGHT  01/01/2018  . LIGATION ARTERIOVENOUS GORTEX GRAFT Right 06/09/2018  . LIGATION ARTERIOVENOUS GORTEX GRAFT Right 06/09/2018   Procedure: LIGATION ARTERIOVENOUS GORTEX GRAFT RIGHT ARM;  Surgeon: Angelia Mould, MD;  Location: [place];  Service:  Vascular;  Laterality: Right;  . LIGATION ARTERIOVENOUS GORTEX GRAFT Left 12/31/2018   Procedure: LIGATION OF LEFT UPPER ARM ARTERIVENOUS GORTEX GRAFT, LEFT BRACHIAL ARTERY ENDARTERECTOMY WITH BOVINE PATCH ANGIOPLASTY;  Surgeon: Waynetta Sandy, MD;  Location: [place];  Service: Vascular;  Laterality: Left;  . LIGATION OF ARTERIOVENOUS  FISTULA Left 12/19/2015   Procedure: LIGATION OF RADIOCEPHALIC ARTERIOVENOUS  FISTULA;  Surgeon: Conrad [place], MD;  Location: [place];  Service: Vascular;  Laterality: Left;  Marland Kitchen MASS EXCISION Right 02/18/2017   Procedure: EXCISION OF RIGHT AXILLARY EPIDERMAL INCLUSION CYST;  Surgeon: Conrad London, MD;  Location: [place];  Service: Vascular;  Laterality: Right;  . PERIPHERAL VASCULAR CATHETERIZATION N/A 12/14/2015   Procedure: Fistulagram;  Surgeon: Conrad Willernie, MD;  Location: [place] CV LAB;  Service: Cardiovascular;  Laterality: N/A;  . SHOULDER SURGERY Right  fracture  . TEE WITHOUT CARDIOVERSION N/A 08/16/2019   Procedure: TRANSESOPHAGEAL ECHOCARDIOGRAM (TEE);  Surgeon: Elouise Munroe, MD;  Location: [place];  Service: Cardiovascular;  Laterality: N/A;  . ULTRASOUND GUIDANCE FOR VASCULAR ACCESS  04/15/2017   Procedure: Ultrasound Guidance For Vascular Access;  Surgeon: Angelia Mould, MD;  Location: [place] CV LAB;  Service: Cardiovascular;;  . UPPER EXTREMITY VENOGRAPHY Bilateral 12/17/2016   Procedure: Bilateral Upper Extremity Venography;  Surgeon: Serafina Mitchell, MD;  Location: [place] CV LAB;  Service: Cardiovascular;  Laterality: Bilateral;  . UPPER EXTREMITY VENOGRAPHY Left 11/03/2018   Procedure: UPPER EXTREMITY VENOGRAPHY;  Surgeon: Serafina Mitchell, MD;  Location: [place] CV LAB;  Service: Cardiovascular;  Laterality: Left;    Prior to Admission medications   Medication Sig Start Date End Date Taking? Authorizing Provider  Amino Acids-Protein Hydrolys (FEEDING SUPPLEMENT, PRO-STAT SUGAR FREE 64,) LIQD Take 30 mLs  by mouth in the morning and at bedtime. (0900 & 2100)   Yes [provider]  atorvastatin (LIPITOR) 40 MG tablet Take 40 mg by mouth daily. (0900) 11/03/15  Yes [provider]  B Complex-C (B-COMPLEX WITH VITAMIN C) tablet Take 1 tablet by mouth daily. (0900)   Yes [provider]  cholecalciferol (VITAMIN D3) 25 MCG (1000 UT) tablet Take 1,000 Units by mouth daily. (0900)   Yes [provider]  cinacalcet (SENSIPAR) 60 MG tablet Take 60 mg by mouth daily at 10 pm. (2100)   Yes [provider]  clopidogrel (PLAVIX) 75 MG tablet Take 75 mg by mouth daily. (0900) 05/24/19  Yes [provider]  magnesium hydroxide (MILK OF MAGNESIA) 400 MG/5ML suspension Take 30 mLs by mouth 2 (two) times daily as needed (constipation).   Yes [provider]  melatonin 3 MG TABS tablet Take 3 mg by mouth at bedtime. (2200)   Yes [provider]  midodrine (PROAMATINE) 10 MG tablet Take 1 tablet (10 mg total) by mouth 3 (three) times daily. Patient taking differently: Take 10 mg by mouth 3 (three) times daily. (0900, 1300 & 1700) 08/20/19 09/19/19 Yes Elodia Florence., MD  mirtazapine (REMERON) 7.5 MG tablet Take 7.5 mg by mouth at bedtime. (2200)   Yes [provider]  Nutritional Supplements (FEEDING SUPPLEMENT, NEPRO CARB STEADY,) LIQD Take 237 mLs by mouth 2 (two) times daily between meals. Patient taking differently: Take 237 mLs by mouth 2 (two) times daily between meals. (0800 & 2100) 07/22/19  Yes Persons, Bevely Palmer, PA  oxyCODONE (OXY IR/ROXICODONE) 5 MG immediate release tablet Take 5 mg by mouth every 4 (four) hours as needed (mild to moderate pain).   Yes [provider]  paricalcitol (ZEMPLAR) 2 MCG capsule Take 2 mcg by mouth See admin instructions. Take 1 capsule (2 mcg) by mouth 3 times daily on Mondays, Wednesdays, & Fridays at 1200, 1700, & 2200.   Yes [provider]  saccharomyces boulardii (FLORASTOR)  250 MG capsule Take 250 mg by mouth 2 (two) times daily. (0800 & 2000)   Yes [provider]  sennosides-docusate sodium (SENOKOT-S) 8.6-50 MG tablet Take 1 tablet by mouth daily. (0800)   Yes [provider]  vitamin C (ASCORBIC ACID) 500 MG tablet Take 500 mg by mouth every evening. (1700)   Yes [provider]  doxercalciferol ([name]) 2.5 MCG capsule Take 1 capsule (2.5 mcg total) by mouth every Monday, Wednesday, and Friday with hemodialysis. Patient not taking: Reported on 09/02/2019 08/20/19 09/19/19  Florene Glen,  A Clint Lipps., MD    Current Facility-Administered Medications  Medication Dose Route Frequency Provider Last Rate Last Admin  . 0.9 %  sodium chloride infusion  100 mL Intravenous PRN Alric Seton, PA-C      . 0.9 %  sodium chloride infusion  100 mL Intravenous PRN Alric Seton, PA-C      . 0.9 %  sodium chloride infusion   Intravenous Continuous Annye Asa, MD      . alteplase (CATHFLO ACTIVASE) injection 2 mg  2 mg Intracatheter Once PRN Alric Seton, PA-C      . ceFAZolin (ANCEF) IVPB 2g/100 mL premix  2 g Intravenous On Call to OR Persons, Bevely Palmer, PA      . chlorhexidine (PERIDEX) 0.12 % solution 15 mL  15 mL Mouth/Throat Once Annye Asa, MD       Or  . MEDLINE mouth rinse  15 mL Mouth Rinse Once Annye Asa, MD      . heparin injection 1,000 Units  1,000 Units Dialysis PRN Alric Seton, PA-C      . lidocaine (PF) (XYLOCAINE) 1 % injection 5 mL  5 mL Intradermal PRN Alric Seton, PA-C      . lidocaine-prilocaine (EMLA) cream 1 application  1 application Topical PRN Alric Seton, PA-C      . pentafluoroprop-tetrafluoroeth (GEBAUERS) aerosol 1 application  1 application Topical PRN Alric Seton, PA-C        Allergies as of 09/02/2019 - Review Complete 09/02/2019  Allergen Reaction Noted  . Aspirin Other (See Comments) 10/03/2011    Family History  Problem Relation Age of Onset  . Hypertension Mother     . Colon cancer Neg Hx   . Liver disease Neg Hx     Social History   Socioeconomic History  . Marital status: Single    Spouse name: Not on file  . Number of children: 2  . Years of education: Not on file  . Highest education level: Not on file  Occupational History  . Occupation: retired, Licensed conveyancer    Employer: RETIRED  Tobacco Use  . Smoking status: Former Smoker    Years: 25.00    Quit date: 04/29/2004    Years since quitting: 15.3  . Smokeless tobacco: Former Systems developer    Types: Chew    Quit date: 01/16/1987  . Tobacco comment: quit 2006  Substance and Sexual Activity  . Alcohol use: No  . Drug use: No  . Sexual activity: Not on file  Other Topics Concern  . Not on file  Social History Narrative   Lives alone   Daughter 20-min away   Caffeine use: 32oz soda per day   Social Determinants of Health   Financial Resource Strain:   . Difficulty of Paying Living Expenses:   Food Insecurity:   . Worried About Charity fundraiser in the Last Year:   . Arboriculturist in the Last Year:   Transportation Needs:   . Film/video editor (Medical):   Marland Kitchen Lack of Transportation (Non-Medical):   Physical Activity:   . Days of Exercise per Week:   . Minutes of Exercise per Session:   Stress:   . Feeling of Stress :   Social Connections:   . Frequency of Communication with Friends and Family:   . Frequency of Social Gatherings with Friends and Family:   . Attends Religious Services:   . Active Member of Clubs or Organizations:   . Attends Archivist  Meetings:   Marland Kitchen Marital Status:   Intimate Partner Violence:   . Fear of Current or Ex-Partner:   . Emotionally Abused:   Marland Kitchen Physically Abused:   . Sexually Abused:     Review of Systems: All systems reviewed and negative except where noted in HPI.  Physical Exam: Vital signs in last 24 hours: Temp:  [97.8 F (36.6 C)] 97.8 F (36.6 C) (05/07 0836) Pulse Rate:  [86] 86 (05/07 0836) BP: (138)/(51) 138/51 (05/07  0836) SpO2:  [100 %] 100 % (05/07 0836) Weight:  [65.8 kg] 65.8 kg (05/07 0836)   General:   Alert, thin in NAD Psych:  Pleasant, cooperative. Normal mood and affect. Eyes:  Pupils equal, sclera clear, no icterus.   Conjunctiva pink. Ears:  Normal auditory acuity. Nose:  No deformity, discharge,  or lesions. Neck:  Supple; no masses Lungs:  Clear throughout to auscultation.   No wheezes, crackles, or rhonchi.  Heart:  Regular rate and rhythm; no murmurs, no lower extremity edema Abdomen:  Soft, non-distended, nontender, BS active, no palp mass   Rectal:  Large amount of soft stool mixed with bright red blood in vault.  Msk:  Symmetrical without gross deformities. . Neurologic:  Alert and  oriented x4;  grossly normal neurologically. Skin:  Sacral wound  Intake/Output from previous day: No intake/output data recorded. Intake/Output this shift: No intake/output data recorded.  Lab Results: No results for input(s): WBC, HGB, HCT, PLT in the last 72 hours. BMET No results for input(s): NA, K, CL, CO2, GLUCOSE, BUN, CREATININE, CALCIUM in the last 72 hours. LFT No results for input(s): PROT, ALBUMIN, AST, ALT, ALKPHOS, BILITOT, BILIDIR, IBILI in the last 72 hours. PT/INR No results for input(s): LABPROT, INR in the last 72 hours. Hepatitis Panel No results for input(s): HEPBSAG, HCVAB, HEPAIGM, HEPBIGM in the last 72 hours.   . CBC Latest Ref Rng & Units 08/20/2019 08/19/2019 08/18/2019  WBC 4.0 - 10.5 K/uL 34.6(H) 35.5(H) 27.6(H)  Hemoglobin 13.0 - 17.0 g/dL 11.8(L) 11.9(L) 11.0(L)  Hematocrit 39.0 - 52.0 % 35.9(L) 36.3(L) 33.1(L)  Platelets 150 - 400 K/uL 460(H) 398 348    . CMP Latest Ref Rng & Units 08/20/2019 08/19/2019 08/18/2019  Glucose 70 - 99 mg/dL 99 84 96  BUN 8 - 23 mg/dL 23 36(H) 25(H)  Creatinine 0.61 - 1.24 mg/dL 4.63(H) 6.14(H) 5.10(H)  Sodium 135 - 145 mmol/L 134(L) 135 137  Potassium 3.5 - 5.1 mmol/L 4.2 5.0 4.4  Chloride 98 - 111 mmol/L 96(L) 98 101  CO2 22  - 32 mmol/L 26 22 22   Calcium 8.9 - 10.3 mg/dL 8.1(L) 8.4(L) 8.2(L)  Total Protein 6.5 - 8.1 g/dL 6.6 7.1 6.3(L)  Total Bilirubin 0.3 - 1.2 mg/dL 0.7 0.8 0.8  Alkaline Phos 38 - 126 U/L 64 65 58  AST 15 - 41 U/L 69(H) 88(H) 82(H)  ALT 0 - 44 U/L 5 5 <5   Studies/Results: No results found.  Active Problems:   * No active hospital problems. Tye Savoy, NP-C @  09/03/2019, 10:36 AM

## 2019-09-04 ENCOUNTER — Encounter (HOSPITAL_COMMUNITY)
Admission: RE | Disposition: A | Discharge status: Skilled Nursing Facility | Payer: Self-pay | Source: Home / Self Care | Attending: Internal Medicine

## 2019-09-04 ENCOUNTER — Inpatient Hospital Stay (HOSPITAL_COMMUNITY): Payer: Medicare Other | Admitting: Certified Registered Nurse Anesthetist

## 2019-09-04 ENCOUNTER — Encounter (HOSPITAL_COMMUNITY): Payer: Self-pay | Admitting: Internal Medicine

## 2019-09-04 DIAGNOSIS — Z515 Encounter for palliative care: Secondary | ICD-10-CM

## 2019-09-04 DIAGNOSIS — I96 Gangrene, not elsewhere classified: Secondary | ICD-10-CM

## 2019-09-04 DIAGNOSIS — Z7189 Other specified counseling: Secondary | ICD-10-CM

## 2019-09-04 DIAGNOSIS — D72829 Elevated white blood cell count, unspecified: Secondary | ICD-10-CM

## 2019-09-04 DIAGNOSIS — Z01810 Encounter for preprocedural cardiovascular examination: Secondary | ICD-10-CM

## 2019-09-04 DIAGNOSIS — K269 Duodenal ulcer, unspecified as acute or chronic, without hemorrhage or perforation: Secondary | ICD-10-CM

## 2019-09-04 DIAGNOSIS — K31819 Angiodysplasia of stomach and duodenum without bleeding: Secondary | ICD-10-CM

## 2019-09-04 DIAGNOSIS — K228 Other specified diseases of esophagus: Secondary | ICD-10-CM

## 2019-09-04 HISTORY — PX: ESOPHAGOGASTRODUODENOSCOPY (EGD) WITH PROPOFOL: SHX5813

## 2019-09-04 HISTORY — PX: BIOPSY: SHX5522

## 2019-09-04 LAB — HEMOGLOBIN AND HEMATOCRIT, BLOOD
HCT: 25.8 % — ABNORMAL LOW (ref 39.0–52.0)
Hemoglobin: 8.5 g/dL — ABNORMAL LOW (ref 13.0–17.0)

## 2019-09-04 LAB — CBC
HCT: 23.5 % — ABNORMAL LOW (ref 39.0–52.0)
Hemoglobin: 7.3 g/dL — ABNORMAL LOW (ref 13.0–17.0)
MCH: 19.8 pg — ABNORMAL LOW (ref 26.0–34.0)
MCHC: 31.1 g/dL (ref 30.0–36.0)
MCV: 63.7 fL — ABNORMAL LOW (ref 80.0–100.0)
Platelets: 293 10*3/uL (ref 150–400)
RBC: 3.69 MIL/uL — ABNORMAL LOW (ref 4.22–5.81)
RDW: 19.7 % — ABNORMAL HIGH (ref 11.5–15.5)
WBC: 16.4 10*3/uL — ABNORMAL HIGH (ref 4.0–10.5)
nRBC: 0 % (ref 0.0–0.2)

## 2019-09-04 LAB — PREPARE RBC (CROSSMATCH)

## 2019-09-04 SURGERY — ESOPHAGOGASTRODUODENOSCOPY (EGD) WITH PROPOFOL
Anesthesia: Monitor Anesthesia Care

## 2019-09-04 MED ORDER — AMIODARONE HCL 200 MG PO TABS
200.0000 mg | ORAL_TABLET | Freq: Two times a day (BID) | ORAL | Status: DC
  Administered 2019-09-04 – 2019-09-09 (×10): 200 mg via ORAL
  Filled 2019-09-04 (×10): qty 1

## 2019-09-04 MED ORDER — PHENYLEPHRINE 40 MCG/ML (10ML) SYRINGE FOR IV PUSH (FOR BLOOD PRESSURE SUPPORT): PREFILLED_SYRINGE | INTRAVENOUS | Status: DC | PRN

## 2019-09-04 MED ORDER — PROPOFOL 500 MG/50ML IV EMUL
INTRAVENOUS | Status: DC | PRN
  Administered 2019-09-04: 100 ug/kg/min via INTRAVENOUS

## 2019-09-04 MED ORDER — PANTOPRAZOLE SODIUM 40 MG IV SOLR
40.0000 mg | Freq: Two times a day (BID) | INTRAVENOUS | Status: DC
  Administered 2019-09-04 – 2019-09-05 (×4): 40 mg via INTRAVENOUS
  Filled 2019-09-04 (×5): qty 40

## 2019-09-04 MED ORDER — SODIUM CHLORIDE 0.9% IV SOLUTION: Freq: Once | INTRAVENOUS | Status: AC

## 2019-09-04 MED ORDER — PHENYLEPHRINE HCL-NACL 10-0.9 MG/250ML-% IV SOLN
INTRAVENOUS | Status: DC | PRN
  Administered 2019-09-04: 25 ug/min via INTRAVENOUS

## 2019-09-04 MED ORDER — PROPOFOL 10 MG/ML IV BOLUS
INTRAVENOUS | Status: DC | PRN
  Administered 2019-09-04 (×2): 20 mg via INTRAVENOUS

## 2019-09-04 MED ORDER — ALBUMIN HUMAN 5 % IV SOLN
INTRAVENOUS | Status: DC | PRN
Start: 2019-09-04 — End: 2019-09-04

## 2019-09-04 MED ORDER — SODIUM CHLORIDE 0.9 % IV SOLN
125.0000 mg | INTRAVENOUS | Status: AC
  Administered 2019-09-06 – 2019-09-15 (×3): 125 mg via INTRAVENOUS
  Filled 2019-09-04 (×6): qty 10

## 2019-09-04 MED ORDER — SODIUM CHLORIDE 0.9% FLUSH
10.0000 mL | Freq: Two times a day (BID) | INTRAVENOUS | Status: DC
  Administered 2019-09-04 – 2019-09-23 (×12): 10 mL

## 2019-09-04 MED ORDER — PHENYLEPHRINE 40 MCG/ML (10ML) SYRINGE FOR IV PUSH (FOR BLOOD PRESSURE SUPPORT)
PREFILLED_SYRINGE | INTRAVENOUS | Status: DC | PRN
  Administered 2019-09-04 (×4): 80 ug via INTRAVENOUS

## 2019-09-04 MED ORDER — SODIUM CHLORIDE 0.9% FLUSH
10.0000 mL | INTRAVENOUS | Status: DC | PRN
  Administered 2019-09-04: 20:00:00 10 mL

## 2019-09-04 MED ORDER — ALBUMIN HUMAN 5 % IV SOLN
12.5000 g | Freq: Once | INTRAVENOUS | Status: AC
  Administered 2019-09-04: 12.5 g via INTRAVENOUS
  Filled 2019-09-04: qty 250

## 2019-09-04 SURGICAL SUPPLY — 25 items

## 2019-09-04 NOTE — Transfer of Care (Signed)
Immediate Anesthesia Transfer of Care Note  Patient: Joseph MINDER Sr.  Procedure(s) Performed: ESOPHAGOGASTRODUODENOSCOPY (EGD) WITH PROPOFOL (N/A ) BIOPSY  Patient Location: Endoscopy Unit  Anesthesia Type:MAC  Level of Consciousness: awake  Airway & Oxygen Therapy: Patient Spontanous Breathing and Patient connected to nasal cannula oxygen  Post-op Assessment: Report given to RN and Post -op Vital signs reviewed and stable  Post vital signs: Reviewed and stable  Last Vitals:  Vitals Value Taken Time  BP    Temp 37.2 C 09/04/19 1219  Pulse 98 09/04/19 1219  Resp 18 09/04/19 1219  SpO2 99 % 09/04/19 1219    Last Pain:  Vitals:   09/04/19 1219  TempSrc: Axillary  PainSc: Asleep         Complications: No apparent anesthesia complications

## 2019-09-04 NOTE — Op Note (Signed)
[facility] Patient Name: [name] Procedure Date : 09/04/2019 MRN: [id number] Attending MD: Thornton Park MD, MD Date of Birth: [birth year] CSN: [id number] Age: 79 Admit Type: Inpatient Procedure:                Upper GI endoscopy Indications:              Hematochezia Providers:                Thornton Park MD, MD, Doristine Johns, RN,                            Jeanella Cara, RN, Elspeth Cho Tech.,                            Technician, Clearnce Sorrel, CRNA Referring MD:              Medicines:                Monitored Anesthesia Care Complications:            No immediate complications. Estimated blood loss:                            Minimal. Estimated Blood Loss:     Estimated blood loss was minimal. Procedure:                Pre-Anesthesia Assessment:                           - Prior to the procedure, a History and Physical                            was performed, and patient medications and                            allergies were reviewed. The patient's tolerance of                            previous anesthesia was also reviewed. The risks                            and benefits of the procedure and the sedation                            options and risks were discussed with the patient.                            All questions were answered, and informed consent                            was obtained. Prior Anticoagulants: The patient has                            taken no previous anticoagulant or antiplatelet                            agents. ASA Grade Assessment:  III - A patient with                            severe systemic disease. After reviewing the risks                            and benefits, the patient was deemed in                            satisfactory condition to undergo the procedure.                           After obtaining informed consent, the endoscope was                            passed under direct vision.  Throughout the                            procedure, the patient's blood pressure, pulse, and                            oxygen saturations were monitored continuously. The                            GIF-H190 ([id number] Olympus gastroscope was                            introduced through the mouth, and advanced to the                            third part of duodenum. The upper GI endoscopy was                            accomplished without difficulty. The patient                            tolerated the procedure well. Scope In: Scope Out: Findings:      Diffuse moderate mucosal changes characterized by white specks were       found in the entire esophagus consistent with parakeratosis. Biopsies       were taken with a cold forceps for histology. Estimated blood loss was       minimal.      Heme was present in the stomach. A few dispersed small erosions with no       bleeding and no stigmata of recent bleeding were found in the gastric       body. Patchy gastric erythema was present through the gastric body and       fundus. Biopsies were taken from the antrum, body, and fundus with a       cold forceps for histology. Estimated blood loss was minimal.      One non-bleeding cratered duodenal ulcer with no stigmata of bleeding       was found in the first portion of the duodenum. The lesion was 10 mm in       largest dimension. The margins were not heaped.  No blood seen in the       duodenum.      A single small angioectasia without bleeding was found in the second       portion of the duodenum.      A small hiatal hernia was present.      The cardia and gastric fundus were normal on retroflexion.      The exam was otherwise without abnormality. Impression:               - Duodenal ulcer may be the cause of recent overt                            GI bleeding.                           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 Biopsied.                           - Erosive gastropathy with  no bleeding and no                            stigmata of recent bleeding. Biopsied.                           - A single non-bleeding angioectasia in the                            duodenum.                           - Small hiatal hernia.                           - Unable to proceed with colonoscopy as planned due                            to incomplete preparation. Patient had formed stool                            on last bowel movement. Recommendation:           - Return patient to hospital ward for ongoing care.                           - Clear liquid diet. Advance as tolerated.                           - No aspirin, ibuprofen, naproxen, or other                            non-steroidal anti-inflammatory drugs.                           - Patient has a prolonged QT. Discussed with Dr.                            Starla Link who agreed with trial  of pantoprazole 40 mg                            IV BID with close monitoring of QTc.                           - Will defer colonoscopy at this time given the                            challenges that the patient has prepping for the                            procedure. Duodenal ulcer may be the source of                            bleeding.                           - Continue serial hgb/hct with transfusion as                            indicated. Procedure Code(s):        --- Professional ---                           [phone], Esophagogastroduodenoscopy, flexible,                            transoral; with biopsy, single or multiple Diagnosis Code(s):        --- Professional ---                           K22.8, Other specified diseases of esophagus                           K31.89, Other diseases of stomach and duodenum                           K26.9, Duodenal ulcer, unspecified as acute or                            chronic, without hemorrhage or perforation                           K31.819, Angiodysplasia of stomach and duodenum                             without bleeding                           K44.9, Diaphragmatic hernia without obstruction or                            gangrene CPT copyright 2019 [employer]. All rights reserved. The codes documented in this report are preliminary and upon coder review may  be revised to meet current compliance requirements.  Thornton Park MD, MD 09/04/2019 12:32:34 PM This report has been signed electronically. Number of Addenda: 0

## 2019-09-04 NOTE — Consult Note (Signed)
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 However in the preoperative area, patient noted to have rectal bleeding for which [facility] was consulted and advised that the procedure will need to be postponed.  Patient was subsequently admitted.  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 The colonoscopy could not be performed due to inadequate prep.   Palliative care was consulted to aide in goals of care conversations.   Clinical Assessment/Goals of Care: I have reviewed VS, medical records including EPIC notes, labs and imaging, and  assessed the patient.    I met with [name] at bedside to further discuss diagnosis prognosis, GOC, EOL wishes, disposition and options.   I introduced Palliative Medicine as specialized medical care for people living with  serious illness. It focuses on providing relief from the symptoms and stress of a serious illness. The goal is to improve quality of life for both the patient and the family. After meeting with [name] I called his daughter [name] [phone] to discuss our conversation, explain Palliative Medicine and provide support to her.   I asked [name] to tell me about himself. He worked as a Psychologist, sport and exercise for someone else and loves being outdoors. For the last few months he has been in a SNF in [place]. He has a son and daughter. He tries to do as much as he can for himself.   A detailed discussion was had today regarding advanced directives.  Concepts specific to code status, artifical feeding and hydration, continued IV antibiotics and rehospitalization was had.  The difference between non aggressive medical intervention path  and a palliative comfort care path for this patient at this time was had. Values and goals of care important to patient and family were attempted to be elicited.   Discussed the importance of continued conversation with family and their  medical providers regarding overall plan of care and treatment options, ensuring decisions are within the context of the patients values and GOCs.  Decision Maker: Patient  SUMMARY OF RECOMMENDATIONS    Code Status/Advance Care Planning: Discussed code status with [name] and he wished to have only chest compressions, no ventilations or ventilator use, and no use of shock or electricity to revive his heart if he is without pulse and respirations. A MOST form was completed and is in Media and the original was inserted into the bedside chart. He would wish to have antibiotics, and feeding by tube for a limited period. His daughter is aware of his wishes.   Symptom Management:  He denies pain of any type, dyspnea, dizziness or any other symptoms or  distress   Additional Recommendations (Limitations, Scope, Preferences):  He wishes to continue  dialysis and hopes his leg stump can be revised once GI bleeding is resolved.   Psycho-social/Spiritual:   Desire for further Chaplaincy support: none  Additional Recommendations: He may benefit from outpatient palliative care at discharge.   Prognosis:   He has multiple co-morbidities and life limiting illnesses.    Discharge Planning: Discharge to SNF when medically optimized. PT and OT have not been able to evaluate [name] yet due to his procedure today.   At exam patient was resting comfortably, in no distress,  alert, O x 3, conversant.  Blood pressure (!) 134/101, pulse (!) 106, temperature 98.5 F (36.9 C), temperature source Oral, resp. rate 18, height 5' 6"  (1.676 m), weight 62.4 kg, SpO2 96 %.   PPS: 50%   This conversation/these recommendations were discussed with patient primary care team, [name].  Time In:3:45 Time Out:4:30 Total Time: *75 minutes Greater than 50%  of this time was spent counseling and coordinating care related to the above assessment and plan.  Joseph Spar, NP  [facility] Palliative Medicine Team Team Cell Phone: [phone] Please utilize secure chat with additional questions, if there is no response within 30 minutes please call the above phone number  Palliative Medicine Team providers are available by phone from 7am to 7pm daily and can be reached through the team cell phone.  Should this patient require assistance outside of these hours, please call the patient's attending physician.

## 2019-09-04 NOTE — Consult Note (Addendum)
Cardiology Consultation:   Patient ID: Joseph TSCHANTZ Sr.; [id number]; 07-07-1940   Admit date: 09/03/2019 Date of Consult: 09/04/2019  Primary Care Provider: [name], MD Primary Cardiologist: Rozann Lesches, MD  Primary Electrophysiologist:  None   Patient Profile:   Joseph [place] Sr. is a 79 y.o. 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 Starla Link.  History of Present Illness:   [name] was discharged 4/23 for sepsis due to postoperative left foot infection.  He had a TEE at that time that showed no vegetations.  He was on vancomycin through dialysis through 4/27.  He developed necrotic changes to his left transtibial amputation stump and came to the hospital 5/7 for AKA.  However he had rectal bleeding in the perioperative area.  He was admitted and GI was consulted.  Plavix was put on hold.  He had an EGD today.  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  There was erosive gastropathy with no bleeding and no stigmata of recent bleeding.  A single nonbleeding angiectasia was seen in the duodenum.  Patchy gastric erythema was seen.  They were unable to proceed with colonoscopy.  He was having frequent ventricular ectopy and may possibly need another procedure, cardiology was consulted.  Mr. Sawin is unaware of the ventricular ectopy.  He denies any history of presyncope or syncope.  He never gets chest pain.  He denies shortness of breath.  He is willing to walk with a walker but has not been ambulatory recently.  However, he denies any history of dyspnea on exertion.   Past Medical History:  Diagnosis Date  . Acute osteomyelitis of left foot ([place]) 07/21/2019  . Anxiety   . Aortic stenosis   . Arthritis   . Closed avulsion fracture of right hip ([place])  06/30/2019  . ESRD (end stage renal disease) on dialysis [facility])    M/W/F at [facility] in [place]  . Essential hypertension   . Gangrene of left foot ([place]) 06/04/2018  . Gangrene of right foot ([place])   . Gastric ulcer 2004  . GI bleed   . History of blood transfusion   . History of cardiomyopathy    LVEF normal as of February 2017  . History of gastric ulcer   . History of stroke    Left side weakness  . Iron deficiency anemia   . Osteomyelitis ([place])   . Peripheral vascular disease [facility])     Past Surgical History:  Procedure Laterality Date  . ABDOMINAL AORTAGRAM N/A 01/24/2012   Procedure: ABDOMINAL Maxcine Ham;  Surgeon: Elam Dutch, MD;  Location: [facility] CATH LAB;  Service: Cardiovascular;  Laterality: N/A;  . ABDOMINAL AORTOGRAM W/LOWER EXTREMITY N/A 04/15/2017   Procedure: ABDOMINAL AORTOGRAM W/LOWER EXTREMITY;  Surgeon: Angelia Mould, MD;  Location: [place] CV LAB;  Service: Cardiovascular;  Laterality: N/A;  . AMPUTATION Right 05/09/2017   Procedure: RIGHT TRANSMETATARSAL AMPUTATION;  Surgeon: [name], MD;  Location: [place];  Service: Orthopedics;  Laterality: Right;  . AMPUTATION Right 06/13/2018   Procedure: AMPUTATION RIGHT LONG FINGER;  Surgeon: Dayna Barker, MD;  Location: [place];  Service: Plastics;  Laterality: Right;  . AMPUTATION Left 07/21/2019   Procedure: LEFT TRANSMETATARSAL AMPUTATION;  Surgeon: [name], MD;  Location: [facility]  OR;  Service: Orthopedics;  Laterality: Left;  . AMPUTATION Left 08/13/2019   Procedure: AMPUTATION BELOW KNEE;  Surgeon: [name], MD;  Location: [place];  Service: Orthopedics;  Laterality: Left;  . ARTERIOVENOUS GRAFT PLACEMENT Right right arm  . AV FISTULA PLACEMENT Left 08/31/2015   Procedure: ARTERIOVENOUS (AV) FISTULA CREATION- LEFT RADIOCEPHALIC;  Surgeon: Mal Misty, MD;  Location: [place];  Service: Vascular;  Laterality: Left;  . AV FISTULA PLACEMENT Right 02/18/2017   Procedure: INSERTION OF ARTERIOVENOUS (AV)  GORE-TEX GRAFT  RIGHT UPPER ARM;  Surgeon: Conrad Pukwana, MD;  Location: [place];  Service: Vascular;  Laterality: Right;  . AV FISTULA PLACEMENT Left 12/01/2018   Procedure: INSERTION OF ARTERIOVENOUS (AV) GORE-TEX GRAFT ARM;  Surgeon: Angelia Mould, MD;  Location: [place];  Service: Vascular;  Laterality: Left;  . AV FISTULA PLACEMENT Left 02/09/2019   Procedure: INSERTION OF ARTERIOVENOUS (AV) GORE-TEX GRAFT LEFT THIGH;  Surgeon: Waynetta Sandy, MD;  Location: [place];  Service: Vascular;  Laterality: Left;  . BASCILIC VEIN TRANSPOSITION Left 12/19/2015   Procedure: FIRST STAGE BRACHIAL VEIN TRANSPOSITION;  Surgeon: Conrad [place], MD;  Location: [place];  Service: Vascular;  Laterality: Left;  . BASCILIC VEIN TRANSPOSITION Left 02/08/2016   Procedure: SECOND STAGE BRACHIAL VEIN TRANSPOSITION;  Surgeon: Conrad [place], MD;  Location: [place];  Service: Vascular;  Laterality: Left;  . BUBBLE STUDY  08/16/2019   Procedure: BUBBLE STUDY;  Surgeon: Elouise Munroe, MD;  Location: [place];  Service: Cardiovascular;;  . CARDIAC CATHETERIZATION N/A 07/11/2015   Procedure: Left Heart Cath and Coronary Angiography;  Surgeon: Troy Sine, MD;  Location: [place] CV LAB;  Service: Cardiovascular;  Laterality: N/A;  . Carpel Tunnel Left Dec. 22, 2016  . CHOLECYSTECTOMY    . COLONOSCOPY  2004   Dr. Irving Shows, left sided diverticula and cecal polyp, path unknown  . COLONOSCOPY  10/29/2011   Procedure: COLONOSCOPY;  Surgeon: Daneil Dolin, MD;  [facility];  Service: Endoscopy;  Laterality: N/A;  10:15  . ESOPHAGOGASTRODUODENOSCOPY  11/2002   Dr. Gala Romney, erosive reflux esophagitis, multiple gastric ulcer and antral/bulbar erosions. Serologies positive for H.Pylori and was treated  . ESOPHAGOGASTRODUODENOSCOPY  11/20014   Dr. Gala Romney, small hh only, ulcers healed  . ESOPHAGOGASTRODUODENOSCOPY  09/21/2011   Dr Trevor Iha [facility], antral erosions, ?early GAVE  . FISTULOGRAM Left  12/10/2016   Procedure: THROMBECTOMY OF LEFT ARM ARTERIOVENOUS FISTULA;  Surgeon: Waynetta Sandy, MD;  Location: [place];  Service: Vascular;  Laterality: Left;  . INSERTION OF DIALYSIS CATHETER Left 12/10/2016   Procedure: INSERTION OF TUNNELED DIALYSIS CATHETER;  Surgeon: Waynetta Sandy, MD;  Location: [place];  Service: Vascular;  Laterality: Left;  . INSERTION OF DIALYSIS CATHETER Right 06/09/2018   Procedure: INSERTION OF DIALYSIS CATHETER Right subclavian;  Surgeon: Angelia Mould, MD;  Location: [place];  Service: Vascular;  Laterality: Right;  . IR DIALY SHUNT INTRO [place] W/IMG RIGHT Right 01/01/2018  . IR GENERIC HISTORICAL  07/16/2016   IR REMOVAL TUN CV CATH W/O FL 07/16/2016 Saverio Danker, PA-C [facility]-INTERV RAD  . IR PTA ADDL CENTRAL DIALYSIS SEG THRU DIALY CIRCUIT RIGHT Right 10/21/2017  . IR REMOVAL TUN CV CATH W/O FL  05/12/2017  . IR THROMBECTOMY AV FISTULA W/THROMBOLYSIS/PTA INC/SHUNT/IMG RIGHT Right 10/21/2017  . IR THROMBECTOMY AV FISTULA W/THROMBOLYSIS/PTA INC/SHUNT/IMG RIGHT Right 11/27/2017  . IR US GUIDE VASC ACCESS RIGHT  10/21/2017  . IR US  GUIDE VASC ACCESS RIGHT  11/27/2017  . IR US GUIDE VASC ACCESS RIGHT  01/01/2018  . LIGATION ARTERIOVENOUS GORTEX GRAFT Right 06/09/2018  . LIGATION ARTERIOVENOUS GORTEX GRAFT Right 06/09/2018   Procedure: LIGATION ARTERIOVENOUS GORTEX GRAFT RIGHT ARM;  Surgeon: Angelia Mould, MD;  Location: [place];  Service: Vascular;  Laterality: Right;  . LIGATION ARTERIOVENOUS GORTEX GRAFT Left 12/31/2018   Procedure: LIGATION OF LEFT UPPER ARM ARTERIVENOUS GORTEX GRAFT, LEFT BRACHIAL ARTERY ENDARTERECTOMY WITH BOVINE PATCH ANGIOPLASTY;  Surgeon: Waynetta Sandy, MD;  Location: [place];  Service: Vascular;  Laterality: Left;  . LIGATION OF ARTERIOVENOUS  FISTULA Left 12/19/2015   Procedure: LIGATION OF RADIOCEPHALIC ARTERIOVENOUS  FISTULA;  Surgeon: Conrad [place], MD;  Location: [place];  Service: Vascular;   Laterality: Left;  Marland Kitchen MASS EXCISION Right 02/18/2017   Procedure: EXCISION OF RIGHT AXILLARY EPIDERMAL INCLUSION CYST;  Surgeon: Conrad Robins, MD;  Location: [place];  Service: Vascular;  Laterality: Right;  . PERIPHERAL VASCULAR CATHETERIZATION N/A 12/14/2015   Procedure: Fistulagram;  Surgeon: Conrad Rogers, MD;  Location: [place] CV LAB;  Service: Cardiovascular;  Laterality: N/A;  . SHOULDER SURGERY Right    fracture  . TEE WITHOUT CARDIOVERSION N/A 08/16/2019   Procedure: TRANSESOPHAGEAL ECHOCARDIOGRAM (TEE);  Surgeon: Elouise Munroe, MD;  Location: [place];  Service: Cardiovascular;  Laterality: N/A;  . ULTRASOUND GUIDANCE FOR VASCULAR ACCESS  04/15/2017   Procedure: Ultrasound Guidance For Vascular Access;  Surgeon: Angelia Mould, MD;  Location: [place] CV LAB;  Service: Cardiovascular;;  . UPPER EXTREMITY VENOGRAPHY Bilateral 12/17/2016   Procedure: Bilateral Upper Extremity Venography;  Surgeon: Serafina Mitchell, MD;  Location: [place] CV LAB;  Service: Cardiovascular;  Laterality: Bilateral;  . UPPER EXTREMITY VENOGRAPHY Left 11/03/2018   Procedure: UPPER EXTREMITY VENOGRAPHY;  Surgeon: Serafina Mitchell, MD;  Location: [place] CV LAB;  Service: Cardiovascular;  Laterality: Left;     Prior to Admission medications   Medication Sig Start Date End Date Taking? Authorizing Provider  Amino Acids-Protein Hydrolys (FEEDING SUPPLEMENT, PRO-STAT SUGAR FREE 64,) LIQD Take 30 mLs by mouth in the morning and at bedtime.    Yes [provider]  atorvastatin (LIPITOR) 40 MG tablet Take 40 mg by mouth daily.  11/03/15  Yes [provider]  B Complex-C (B-COMPLEX WITH VITAMIN C) tablet Take 1 tablet by mouth daily.    Yes [provider]  cholecalciferol (VITAMIN D3) 25 MCG (1000 UT) tablet Take 1,000 Units by mouth daily.    Yes [provider]  cinacalcet (SENSIPAR) 60 MG tablet Take 60 mg by mouth at bedtime.    Yes [provider]  clopidogrel (PLAVIX) 75 MG tablet Take 75 mg by mouth daily.  05/24/19  Yes [provider]  melatonin 3 MG TABS tablet Take 3 mg by mouth at bedtime.    Yes [provider]  midodrine (PROAMATINE) 10 MG tablet Take 1 tablet (10 mg total) by mouth 3 (three) times daily. 08/20/19 09/19/19 Yes Elodia Florence., MD  mirtazapine (REMERON) 7.5 MG tablet Take 7.5 mg by mouth at bedtime.    Yes [provider]  Nutritional Supplements (FEEDING SUPPLEMENT, NEPRO CARB STEADY,) LIQD Take 237 mLs by mouth 2 (two) times daily between meals. Patient taking differently: Take 237 mLs by mouth in the morning and at bedtime.  07/22/19  Yes Persons, [place], [place]  oxyCODONE (OXY IR/ROXICODONE) 5 MG immediate release tablet Take 5-10 mg by  mouth every 4 (four) hours as needed (5 mg mild pain/ 10 mg moderate pain).    Yes [provider]  paricalcitol (ZEMPLAR) 2 MCG capsule Take 2 mcg by mouth See admin instructions. Take 1 capsule (2 mcg) by mouth 3 times daily on Monday, Wednesday, & Friday   Yes [provider]  sennosides-docusate sodium (SENOKOT-S) 8.6-50 MG tablet Take 1 tablet by mouth daily.    Yes [provider]  vitamin C (ASCORBIC ACID) 500 MG tablet Take 500 mg by mouth every evening. (1700)   Yes [provider]  doxercalciferol ([name]) 2.5 MCG capsule Take 1 capsule (2.5 mcg total) by mouth every Monday, Wednesday, and Friday with hemodialysis. 08/20/19 09/19/19  Elodia Florence., MD    Inpatient Medications: Scheduled Meds: . vitamin C  500 mg Oral Q24H  . atorvastatin  40 mg Oral q1800  . B-complex with vitamin C  1 tablet Oral Daily  . Chlorhexidine Gluconate Cloth  6 each Topical Q0600  . cholecalciferol  1,000 Units Oral Daily  . cinacalcet  60 mg Oral QHS  . melatonin  3 mg Oral QHS  . midodrine  10 mg Oral TID WC  . pantoprazole (PROTONIX) IV  40 mg Intravenous Q12H  . sodium chloride flush  10-40 mL Intracatheter  Q12H  . sodium chloride flush  3 mL Intravenous Q12H  . sodium chloride flush  3 mL Intravenous Q12H   Continuous Infusions: . sodium chloride    . sodium chloride    . sodium chloride 100 mL/hr at 09/04/19 1137  . [START ON 09/06/2019] ferric gluconate (FERRLECIT/NULECIT) IV     PRN Meds: sodium chloride, sodium chloride, sodium chloride, acetaminophen **OR** acetaminophen, albuterol, sodium chloride flush, sodium chloride flush  Allergies:    Allergies  Allergen Reactions  . Aspirin Other (See Comments)    Causes internal bleeding  History of ulcers    Social History:   Social History   Socioeconomic History  . Marital status: Single    Spouse name: Not on file  . Number of children: 2  . Years of education: Not on file  . Highest education level: Not on file  Occupational History  . Occupation: retired, Licensed conveyancer    Employer: RETIRED  Tobacco Use  . Smoking status: Former Smoker    Years: 25.00    Quit date: 04/29/2004    Years since quitting: 15.3  . Smokeless tobacco: Former Systems developer    Types: Chew    Quit date: 01/16/1987  . Tobacco comment: quit 2006  Substance and Sexual Activity  . Alcohol use: No  . Drug use: No  . Sexual activity: Not on file  Other Topics Concern  . Not on file  Social History Narrative   Lives alone   Daughter 20-min away   Caffeine use: 32oz soda per day   Social Determinants of Health   Financial Resource Strain:   . Difficulty of Paying Living Expenses:   Food Insecurity:   . Worried About Charity fundraiser in the Last Year:   . Arboriculturist in the Last Year:   Transportation Needs:   . Film/video editor (Medical):   Marland Kitchen Lack of Transportation (Non-Medical):   Physical Activity:   . Days of Exercise per Week:   . Minutes of Exercise per Session:   Stress:   . Feeling of Stress :   Social Connections:   . Frequency of Communication with Friends and Family:   .  Frequency of Social Gatherings with Friends and Family:    . Attends Religious Services:   . Active Member of Clubs or Organizations:   . Attends Archivist Meetings:   Marland Kitchen Marital Status:   Intimate Partner Violence:   . Fear of Current or Ex-Partner:   . Emotionally Abused:   Marland Kitchen Physically Abused:   . Sexually Abused:     Family History:   Family History  Problem Relation Age of Onset  . Hypertension Mother   . Colon cancer Neg Hx   . Liver disease Neg Hx    Family Status:  Family Status  Relation Name Status  . Mother  Deceased  . Father  Deceased  . Neg Hx  (Not Specified)    ROS:  Please see the history of present illness.  All other ROS reviewed and negative.     Physical Exam/Data:   Vitals:   09/04/19 1047 09/04/19 1054 09/04/19 1219 09/04/19 1229  BP: (!) 101/49 (!) 101/49 113/86 (!) 134/101  Pulse: (!) 107 (!) 105 98 (!) 106  Resp: 18 (!) 22 18 18   Temp: 100 F (37.8 C) 100 F (37.8 C) 98.9 F (37.2 C)   TempSrc: Axillary Axillary Axillary   SpO2: 94% 97% 99% 96%  Weight:      Height:        Intake/Output Summary (Last 24 hours) at 09/04/2019 1540 Last data filed at 09/04/2019 1300 Gross per 24 hour  Intake 2704 ml  Output 759 ml  Net 1945 ml    Last 3 Weights 09/04/2019 09/03/2019 09/03/2019  Weight (lbs) 137 lb 9.1 oz 129 lb 13.6 oz 130 lb 8.2 oz  Weight (kg) 62.4 kg 58.9 kg 59.2 kg     Body mass index is 22.2 kg/m.   General: Very frail, elderly, male in no acute distress HEENT: normal for age, edentulous Lymph: no adenopathy Neck: JVD -not elevated Endocrine:  No thryomegaly Vascular: No carotid bruits; upper extremity pulses 2+  Cardiac:  normal S1, S2; slightly irregular rate and rhythm, typical aortic stenosis murmur Lungs:  clear bilaterally, no wheezing, rhonchi or rales  Abd: soft, nontender, no hepatomegaly  Ext: no edema, s/p L BKA and right transmetatarsal mutation Musculoskeletal:  No deformities, BUE and BLE strength weak but equal Skin: warm and dry  Neuro:  CNs 2-12 intact,  no focal abnormalities noted Psych:  Normal affect   EKG:  The EKG was personally reviewed and demonstrates: 5/8 ECG is sinus rhythm/sinus tach, heart rate 105, right bundle branch block is old, frequent ventricular ectopy Telemetry:  Telemetry was personally reviewed and demonstrates: Sinus rhythm with frequent ventricular ectopy   CV studies:   ECHO: 08/12/2019 1. Since the last study on on 12/08/2018 aortic stenosis is now severe.  2. Left ventricular ejection fraction, by estimation, is 60 to 65%. The  left ventricle has normal function. The left ventricle has no regional  wall motion abnormalities. There is mild concentric left ventricular  hypertrophy. Left ventricular diastolic  parameters are consistent with Grade I diastolic dysfunction (impaired  relaxation).  3. Right ventricular systolic function is normal. The right ventricular  size is normal.  4. The mitral valve is normal in structure. Mild mitral valve  regurgitation. No evidence of mitral stenosis.  5. The aortic valve is normal in structure. Aortic valve regurgitation is  not visualized. Severe aortic valve stenosis. Aortic valve mean gradient  measures 47.0 mmHg.  6. The inferior vena cava is normal in size  with greater than 50%  respiratory variability, suggesting right atrial pressure of 3 mmHg.   Conclusion(s)/Recommendation(s): No evidence of valvular vegetations on  this transthoracic echocardiogram. Would recommend a transesophageal  echocardiogram to exclude infective endocarditis if clinically indicated.   TEE: 08/16/2019 1. Left ventricular ejection fraction, by estimation, is 55 to 60%. The  left ventricle has normal function. Left ventricular endocardial border  not optimally defined to evaluate regional wall motion. There is mild left  ventricular hypertrophy.  2. Right ventricular systolic function is normal. The right ventricular  size is normal.  3. Left atrial size was mildly dilated. No  left atrial/left atrial  appendage thrombus was detected. The LAA emptying velocity was 69 cm/s.  4. The mitral valve is degenerative. Mild mitral valve regurgitation.  5. The tricuspid valve is degenerative. Tricuspid valve regurgitation is  mild to moderate.  6. The aortic valve is severely calcified and restricted. Aortic valve  regurgitation is moderate. Severe aortic valve stenosis. Aortic valve  area, by VTI measures 0.69 cm. Aortic valve mean gradient measures 42.0  mmHg.  7. There is Severe (Grade IV) atheroma plaque involving the transverse  and descending aorta.  8. Possible tiny PFO with scant color flow seen between atrial septal  flap, with redundant atrial septum. Agitated saline contrast bubble study  was negative, with no evidence of any interatrial shunt.   Conclusion(s)/Recommendation(s): No evidence of vegetation/infective  endocarditis on this transesophageal  echocardiogram.   Laboratory Data:   Chemistry Recent Labs  Lab 09/03/19 1358  NA 136  K 4.2  CL 98  CO2 26  GLUCOSE 78  BUN 39*  CREATININE 5.70*  CALCIUM 9.2  GFRNONAA 9*  GFRAA 10*  ANIONGAP 12    Lab Results  Component Value Date   ALT 11 09/03/2019   AST 16 09/03/2019   ALKPHOS 65 09/03/2019   BILITOT 1.0 09/03/2019   Hematology Recent Labs  Lab 09/03/19 1358 09/03/19 2107 09/04/19 0615  WBC 17.9* 16.1* 16.4*  RBC 4.16* 4.09* 3.69*  HGB 8.2* 8.1* 7.3*  HCT 26.1* 25.9* 23.5*  MCV 62.7* 63.3* 63.7*  MCH 19.7* 19.8* 19.8*  MCHC 31.4 31.3 31.1  RDW 19.7* 19.6* 19.7*  PLT 329 328 293   Cardiac Enzymes High Sensitivity Troponin:  No results for input(s): TROPONINIHS in the last 720 hours.    BNPNo results for input(s): BNP, PROBNP in the last 168 hours.  DDimer No results for input(s): DDIMER in the last 168 hours. TSH:  Lab Results  Component Value Date   TSH 1.708 08/11/2019   Lipids: Lab Results  Component Value Date   CHOL  12/01/2009    140        ATP III  CLASSIFICATION:  <200     mg/dL   Desirable  200-239  mg/dL   Borderline High  >=240    mg/dL   High          HDL 31 (L) 12/01/2009   LDLCALC  12/01/2009    85        Total Cholesterol/HDL:CHD Risk Coronary Heart Disease Risk Table                     Men   Women  1/2 Average Risk   3.4   3.3  Average Risk       5.0   4.4  2 X Average Risk   9.6   7.1  3 X Average Risk  23.4   11.0  Use the calculated Patient Ratio above and the CHD Risk Table to determine the patient's CHD Risk.        ATP III CLASSIFICATION (LDL):  <100     mg/dL   Optimal  100-129  mg/dL   Near or Above                    Optimal  130-159  mg/dL   Borderline  160-189  mg/dL   High  >190     mg/dL   Very High   TRIG 122 12/01/2009   CHOLHDL 4.5 12/01/2009   HgbA1c: Lab Results  Component Value Date   HGBA1C 6.0 (H) 08/18/2019   Magnesium:  Magnesium  Date Value Ref Range Status  09/03/2019 2.2 1.7 - 2.4 mg/dL Final    Comment:    Performed at [facility], [place] [address] [place], [place] [address]     Radiology/Studies:  [place] CHEST PORT 1 VIEW  Result Date: 09/03/2019 CLINICAL DATA:  Central line placement. EXAM: PORTABLE CHEST 1 VIEW FINDINGS: Left IJ central venous catheter is coursing into the SVC but has a slight bend or curvature at its tip and it could be in the azygos vein. The cardiac silhouette, mediastinal and hilar contours are within normal limits and stable. There is moderate tortuosity and calcification of the thoracic aorta. Stable eventration of the left hemidiaphragm with some overlying vascular crowding and atelectasis. Prominent skin fold over the left chest. No pneumothorax. IMPRESSION: 1. Left IJ central venous catheter is in the SVC but the tip is likely in the azygos vein. 2. No acute cardiopulmonary findings. Electronically Signed   By: Marijo Sanes M.D.   On: 09/03/2019 13:34    Assessment and Plan:   1.  Preoperative evaluation: -He is at increased risk for  any procedure due to his aortic stenosis and other comorbid conditions.  However, his EF is normal and his volume status is managed by dialysis  2.  Ventricular ectopy -His blood pressure will not tolerate a beta-blocker or any other blood pressure lowering medication -Amiodarone is likely the best option to try and decrease the ectopy  Otherwise, per IM, Nephrology, Surgery, GI He is being seen by Palliative Care, this seems appropriate Principal Problem:   Acute GI bleeding Active Problems:   Encounter for central line placement   ESRD on dialysis [facility])   Aortic stenosis   PVD (peripheral vascular disease) ([place])   Essential hypertension   Duodenal ulcer   For questions or updates, please contact [facility] Please consult [email or web address] for contact info under Cardiology/STEMI.   Jonetta Speak, PA-C  09/04/2019 3:40 PM  Cardiology Attending  Patient seen and examined. Agree with the findings as noted above by Rosaria Ferries, PA-C. The patient has ESRD on HD, HTN, severe peripheral vascular disease and non-healing of his stump and devloped worsening ventricular ectopy including a run of VT for which he was asymptomatic. His nurse was in the room with him while the VT was ongoing and did not complain. He has undergone endoscopy as he has developed acute anemia with hemoglobin droping from 11 last month to 7. He is pending revision of his left leg stump. He denies chest pain or sob at present. His exam demonstrates a very frail appearing elderly man, NAD. Left BKA is present. He has a 2/6 AS murmur. ECG shows NSR with frequent and consecutive atrial as well as ventricular ectopy.  A/P 1. Preoperative evaluation - he  is moderate high risk for surgery. That repeat higher amputation is being considered, most likely his procedure can be done very quickly and hopefully with minimal blood loss. He will need to be trasfused. 2. AS - he has surgical AS. He is not a candidate for TAVR  due to his many comorbidities.  3. VT - I have recommended we start oral amiodarone despite his soft pressures and prolonged QT. He has a lot of ventricular ectopy. His bp is too soft to use IV amiodarone.  4. chronic diastolic heart failure - his symptoms appear to be well compensated.   Mikle Bosworth.D.

## 2019-09-04 NOTE — Progress Notes (Signed)
PT Cancellation Note  Patient Details Name: Joseph TREADWAY Sr. MRN: [id number] DOB: 27-Dec-1940   Cancelled Treatment:    Reason Eval/Treat Not Completed: Patient at procedure or test/unavailable. Will follow up as time allows.   Leighton Roach, PT  Acute Rehab Services  Pager [phone] Office [place] 09/04/2019, 11:20 AM

## 2019-09-04 NOTE — Progress Notes (Signed)
Patient ID: [name]., male   DOB: Apr 28, 1941, 79 y.o.   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  However in the preoperative area, patient noted to have rectal bleeding for which [facility] was consulted and advised that the procedure will need to be postponed.  Patient was subsequently admitted.  Assessment & Plan:   Lower GI bleeding Acute blood loss anemia -Patient had rectal bleeding in the preoperative area and was subsequently admitted. -Hemoglobin down to 7.3 today.  Will transfuse 1 unit of packed red cells.  Monitor H&H. -For possible EGD and colonoscopy by GI today.  Plavix on hold.  End-stage renal disease on hemodialysis -Nephrology following.  Dialysis as per nephrology schedule  Progressive gangrenous necrotic changes with stump dehiscence of left transtibial amputation site -Patient was supposed to have outpatient AKA yesterday but instead got admitted for admitting.  Will follow further orthopedics recommendations regarding  timing of surgery.  Leukocytosis -From above.  Monitor  Chronic diastolic CHF -Compensated.  Volume managed by dialysis  Hypertension Chronic hypotension -Monitor blood pressure.  Continue midodrine.  Difficult vascular access  -Status post central line placement by CCM on admission  Severe aortic stenosis -As noted by TEE on previous admission.  Avoid hypotension  Iron deficiency anemia Anemia of chronic disease -Monitor H&H.  Plan as above  History of CVA -Plavix on hold due to GI bleed  Generalized deconditioning -Overall prognosis of this patient is guarded to poor.  Currently full code.  Will request Perative care evaluation for goals of care discussion  DVT prophylaxis: Avoid chemical DVT prophylaxis because of GI bleed Code Status: Full Family Communication: None at bedside Disposition Plan: Status is: Inpatient  Remains inpatient appropriate because:Hemodynamically unstable.  Currently with rectal bleeding and acute blood loss anemia needing GI procedure.  Might possibly need inpatient AKA as well.   Dispo: The patient is from: SNF              Anticipated d/c is to: SNF              Anticipated d/c date is: > 3 days              Patient currently is not medically stable to d/c.   Consultants: GI/PCCM/IR.  Dr. Sharol Given is aware of the patient's admission  Procedures: None  Antimicrobials: None  Subjective: Patient seen and examined at bedside.  He is sleepy, wakes up slightly, does not answer most questions.  Nursing staff reported that he had some rectal bleeding last night.  Objective: Vitals:   09/04/19 0822 09/04/19 0830 09/04/19 1047 09/04/19 1054  BP: (!) 102/53 (!) 92/48 (!) 101/49 (!) 101/49  Pulse: (!) 110  (!) 107 (!) 105  Resp: 20 18 18  (!) 22  Temp: 99.6 F (37.6 C) 99.6 F (37.6 C) 100 F (37.8 C) 100 F (37.8 C)  TempSrc: Axillary Axillary Axillary Axillary  SpO2: 94%  94% 97%  Weight:      Height:        Intake/Output Summary  (Last 24 hours) at 09/04/2019 1059 Last data filed at 09/04/2019 1047 Gross per 24 hour  Intake 2454 ml  Output 759 ml  Net 1695 ml   Filed Weights   09/03/19 1520 09/03/19 1915 09/04/19 0309  Weight: 59.2 kg 58.9 kg 62.4 kg    Examination:  General exam: Appears chronically ill.  Very poor historian.  No distress Respiratory system: Bilateral decreased breath sounds at bases Cardiovascular system: S1 & S2 heard, tachycardic Gastrointestinal system: Abdomen is nondistended, soft and nontender. Normal bowel sounds heard. Extremities: No cyanosis, clubbing; left transtibial amputation site dressing present. Central nervous system: Sleepy, wakes up on a very slightly, slightly confused.  Poor historian no focal neurological deficits. Moving extremities Skin: No ecchymosis or petechiae  psychiatry: Could not be assessed because of mental status    Data Reviewed: I have personally reviewed following labs and imaging studies  CBC: Recent Labs  Lab 09/03/19 1358 09/03/19 2107 09/04/19 0615  WBC 17.9* 16.1* 16.4*  HGB 8.2* 8.1* 7.3*  HCT 26.1* 25.9* 23.5*  MCV 62.7* 63.3* 63.7*  PLT 329 328 353   Basic Metabolic Panel: Recent Labs  Lab 09/03/19 1358  NA 136  K 4.2  CL 98  CO2 26  GLUCOSE 78  BUN 39*  CREATININE 5.70*  CALCIUM 9.2  MG 2.2   GFR: Estimated Creatinine Clearance: 9.7 mL/min (A) (by C-G formula based on SCr of 5.7 mg/dL (H)). Liver Function Tests: Recent Labs  Lab 09/03/19 1358  AST 16  ALT 11  ALKPHOS 65  BILITOT 1.0  PROT 6.4*  ALBUMIN 1.7*   No results for input(s): LIPASE, AMYLASE in the last 168 hours. No results for input(s): AMMONIA in the last 168 hours. Coagulation Profile: No results for input(s): INR, PROTIME in the last 168 hours. Cardiac Enzymes: No results for input(s): CKTOTAL, CKMB, CKMBINDEX, TROPONINI in the last 168 hours. BNP (last 3 results) No results for input(s): PROBNP in the last 8760 hours. HbA1C: No results for  input(s): HGBA1C in the last 72 hours. CBG: No results for input(s): GLUCAP in the last 168 hours. Lipid Profile: No results for input(s): CHOL, HDL, LDLCALC, TRIG, CHOLHDL, LDLDIRECT in the last 72 hours. Thyroid Function Tests: No results for input(s): TSH, T4TOTAL, FREET4, T3FREE, THYROIDAB in the last 72 hours. Anemia Panel: Recent Labs    09/03/19 1530  FERRITIN 371*  TIBC 132*  IRON 14*   Sepsis Labs: No results for input(s): PROCALCITON, LATICACIDVEN in the last 168 hours.  Recent Results (from the past 240 hour(s))  Respiratory Panel by RT PCR (Flu A&B, Covid) - Nasopharyngeal Swab     Status: None   Collection Time: 09/03/19  8:18 AM   Specimen: Nasopharyngeal Swab  Result Value Ref Range Status   SARS Coronavirus 2 by RT PCR NEGATIVE NEGATIVE Final    Comment: (NOTE) SARS-CoV-2 target nucleic acids are NOT DETECTED. The SARS-CoV-2 RNA is generally detectable in upper respiratoy specimens during the acute phase of infection. The lowest concentration of SARS-CoV-2 viral copies this assay can detect is 131 copies/mL. A negative result does not preclude SARS-Cov-2 infection and should not be used as the sole basis for treatment or other patient management decisions. A negative result  may occur with  improper specimen collection/handling, submission of specimen other than nasopharyngeal swab, presence of viral mutation(s) within the areas targeted by this assay, and inadequate number of viral copies (<131 copies/mL). A negative result must be combined with clinical observations, patient history, and epidemiological information. The expected result is Negative. Fact Sheet for Patients:  PinkCheek.be Fact Sheet for Healthcare Providers:  GravelBags.it This test is not yet ap proved or cleared by the [place] FDA and  has been authorized for detection and/or diagnosis of SARS-CoV-2 by FDA under an Emergency  Use Authorization (EUA). This EUA will remain  in effect (meaning this test can be used) for the duration of the COVID-19 declaration under Section 564(b)(1) of the Act, 21 U.S.C. section 360bbb-3(b)(1), unless the authorization is terminated or revoked sooner.    Influenza A by PCR NEGATIVE NEGATIVE Final   Influenza B by PCR NEGATIVE NEGATIVE Final    Comment: (NOTE) The Xpert Xpress SARS-CoV-2/FLU/RSV assay is intended as an aid in  the diagnosis of influenza from Nasopharyngeal swab specimens and  should not be used as a sole basis for treatment. Nasal washings and  aspirates are unacceptable for Xpert Xpress SARS-CoV-2/FLU/RSV  testing. Fact Sheet for Patients: PinkCheek.be Fact Sheet for Healthcare Providers: GravelBags.it This test is not yet approved or cleared by the [place] FDA and  has been authorized for detection and/or diagnosis of SARS-CoV-2 by  FDA under an Emergency Use Authorization (EUA). This EUA will remain  in effect (meaning this test can be used) for the duration of the  Covid-19 declaration under Section 564(b)(1) of the Act, 21  U.S.C. section 360bbb-3(b)(1), unless the authorization is  terminated or revoked. Performed at [facility], [place] [address], [place],  [address]          Radiology Studies: DG CHEST PORT 1 VIEW  Result Date: 09/03/2019 CLINICAL DATA:  Central line placement. EXAM: PORTABLE CHEST 1 VIEW FINDINGS: Left IJ central venous catheter is coursing into the SVC but has a slight bend or curvature at its tip and it could be in the azygos vein. The cardiac silhouette, mediastinal and hilar contours are within normal limits and stable. There is moderate tortuosity and calcification of the thoracic aorta. Stable eventration of the left hemidiaphragm with some overlying vascular crowding and atelectasis. Prominent skin fold over the left chest. No pneumothorax.  IMPRESSION: 1. Left IJ central venous catheter is in the SVC but the tip is likely in the azygos vein. 2. No acute cardiopulmonary findings. Electronically Signed   By: Marijo Sanes M.D.   On: 09/03/2019 13:34        Scheduled Meds: . [MAR Hold] vitamin C  500 mg Oral Q24H  . [MAR Hold] atorvastatin  40 mg Oral q1800  . [MAR Hold] B-complex with vitamin C  1 tablet Oral Daily  . [MAR Hold] Chlorhexidine Gluconate Cloth  6 each Topical Q0600  . [MAR Hold] cholecalciferol  1,000 Units Oral Daily  . [MAR Hold] cinacalcet  60 mg Oral QHS  . [MAR Hold] melatonin  3 mg Oral QHS  . [MAR Hold] midodrine  10 mg Oral TID WC  . [MAR Hold] sodium chloride flush  10-40 mL Intracatheter Q12H  . [MAR Hold] sodium chloride flush  3 mL Intravenous Q12H  . [MAR Hold] sodium chloride flush  3 mL Intravenous Q12H   Continuous Infusions: . [MAR Hold] sodium chloride    . [MAR Hold] sodium chloride    . [  MAR Hold] sodium chloride            Aline August, MD [facility] 09/04/2019, 10:59 AM

## 2019-09-04 NOTE — Progress Notes (Addendum)
[facility] Progress Note   Subjective:   Patient seen and examined at bedside.  Awakens to voice but sleepy.  Denies CP, SOB, abdominal pain, n/v/d.  Per nurse he was up most of the night due to the bowel prep.  Completed dialysis yesterday.   Objective Vitals:   09/04/19 0822 09/04/19 0830 09/04/19 1047 09/04/19 1054  BP: (!) 102/53 (!) 92/48 (!) 101/49 (!) 101/49  Pulse: (!) 110  (!) 107 (!) 105  Resp: 20 18 18  (!) 22  Temp: 99.6 F (37.6 C) 99.6 F (37.6 C) 100 F (37.8 C) 100 F (37.8 C)  TempSrc: Axillary Axillary Axillary Axillary  SpO2: 94%  94% 97%  Weight:      Height:       Physical Exam General:chronically ill appearing male, sleepy, in NAD Heart:IRIR Lungs:CTAB anteriorly  Abdomen:soft, NTND Extremities:R TMA, L BKA, no edema Dialysis Access: L AVG +b   Filed Weights   09/03/19 1520 09/03/19 1915 09/04/19 0309  Weight: 59.2 kg 58.9 kg 62.4 kg    Intake/Output Summary (Last 24 hours) at 09/04/2019 1110 Last data filed at 09/04/2019 1047 Gross per 24 hour  Intake 2454 ml  Output 759 ml  Net 1695 ml    Additional Objective Labs: Basic Metabolic Panel: Recent Labs  Lab 09/03/19 1358  NA 136  K 4.2  CL 98  CO2 26  GLUCOSE 78  BUN 39*  CREATININE 5.70*  CALCIUM 9.2   Liver Function Tests: Recent Labs  Lab 09/03/19 1358  AST 16  ALT 11  ALKPHOS 65  BILITOT 1.0  PROT 6.4*  ALBUMIN 1.7*   CBC: Recent Labs  Lab 09/03/19 1358 09/03/19 2107 09/04/19 0615  WBC 17.9* 16.1* 16.4*  HGB 8.2* 8.1* 7.3*  HCT 26.1* 25.9* 23.5*  MCV 62.7* 63.3* 63.7*  PLT 329 328 293   Iron Studies:  Recent Labs    09/03/19 1530  IRON 14*  TIBC 132*  FERRITIN 371*   Lab Results  Component Value Date   INR 1.1 12/01/2018   INR 1.03 01/01/2018   INR 1.03 11/27/2017   Studies/Results: DG CHEST PORT 1 VIEW  Result Date: 09/03/2019 CLINICAL DATA:  Central line placement. EXAM: PORTABLE CHEST 1 VIEW FINDINGS: Left IJ central venous  catheter is coursing into the SVC but has a slight bend or curvature at its tip and it could be in the azygos vein. The cardiac silhouette, mediastinal and hilar contours are within normal limits and stable. There is moderate tortuosity and calcification of the thoracic aorta. Stable eventration of the left hemidiaphragm with some overlying vascular crowding and atelectasis. Prominent skin fold over the left chest. No pneumothorax. IMPRESSION: 1. Left IJ central venous catheter is in the SVC but the tip is likely in the azygos vein. 2. No acute cardiopulmonary findings. Electronically Signed   By: Marijo Sanes M.D.   On: 09/03/2019 13:34    Medications: . [MAR Hold] sodium chloride    . [MAR Hold] sodium chloride    . [MAR Hold] sodium chloride     . [MAR Hold] vitamin C  500 mg Oral Q24H  . [MAR Hold] atorvastatin  40 mg Oral q1800  . [MAR Hold] B-complex with vitamin C  1 tablet Oral Daily  . [MAR Hold] Chlorhexidine Gluconate Cloth  6 each Topical Q0600  . [MAR Hold] cholecalciferol  1,000 Units Oral Daily  . [MAR Hold] cinacalcet  60 mg Oral QHS  . [MAR Hold] melatonin  3 mg  Oral QHS  . [MAR Hold] midodrine  10 mg Oral TID WC  . [MAR Hold] sodium chloride flush  10-40 mL Intracatheter Q12H  . [MAR Hold] sodium chloride flush  3 mL Intravenous Q12H  . [MAR Hold] sodium chloride flush  3 mL Intravenous Q12H    Dialysis Orders: [facility] - waiting for orders from OP center.  Assessment/Plan: 1.  Rectal bleeding - large BM w/red blood. Endoscopy and colonoscopy scheduled this AM. Hgb dropping yesterday, s/p 1 unit pRBC.  2. L BKA wound dehiscence -scheduled for AKA yesterday, cancelled d/t rectal bleeding. per ortho 3.  ESRD -  On HD MWF. HD yesterday per regular schedule. k 4.2.  Next HD on 5/10.  4.  Hypotension/volume  - Blood pressure soft. Does not appear volume overloaded.  On midodrine TID.   Net UF removed yesterday 729mL.  5.  Anemia of CKD - Hgb drop yesterday 8.2>7.3.  s/p 1unit pRBC this AM. Tsat 11%. Will order Fe.   6.  Secondary Hyperparathyroidism -  Ca in goal. Will check phos. Waiting for OP orders for VDRA. Continue sensipar 7.  Nutrition - Renal diet w/fluid restrictions 8. Diabetes 9. Hx CVA  Jen Mow, PA-C [facility] Pager: [phone] 09/04/2019,11:10 AM  LOS: 1 day   Nephrology attending: Patient was seen and examined.  Chart reviewed.  I agree with assessment and plan.  Underwent upper GI endoscopy with possible bleeding duodenal ulcer.  Trying IV Protonix with close monitoring of QT interval.  Received blood transfusion.  Iron saturation low therapy starting IV iron.  Monitor hemoglobin. Status post dialysis yesterday, tolerated well.  Next HD on 5/10.  Katheran James, MD [facility].

## 2019-09-04 NOTE — Anesthesia Preprocedure Evaluation (Addendum)
Anesthesia Evaluation  Patient identified by MRN, date of birth, ID band Patient confused    Reviewed: Allergy & Precautions, NPO status , Patient's Chart, lab work & pertinent test results, Unable to perform ROS - Chart review only  History of Anesthesia Complications Negative for: history of anesthetic complications  Airway Mallampati: II  TM Distance: >3 FB Neck ROM: Full   Comment:  Unable to perform airway exam due to patient's mental status  Dental  (+) Dental Advisory Given, Edentulous Upper, Edentulous Lower   Pulmonary former smoker,    Pulmonary exam normal breath sounds clear to auscultation       Cardiovascular hypertension, + CAD and + Peripheral Vascular Disease  + dysrhythmias Atrial Fibrillation + Valvular Problems/Murmurs AS  Rhythm:Irregular Rate:Abnormal + Systolic murmurs TEE 3/43/56: 1. Left ventricular ejection fraction, by estimation, is 55 to 60%. The  left ventricle has normal function. Left ventricular endocardial border  not optimally defined to evaluate regional wall motion. There is mild left  ventricular hypertrophy.  2. Right ventricular systolic function is normal. The right ventricular  size is normal.  3. Left atrial size was mildly dilated. No left atrial/left atrial  appendage thrombus was detected. The LAA emptying velocity was 69 cm/s.  4. The mitral valve is degenerative. Mild mitral valve regurgitation.  5. The tricuspid valve is degenerative. Tricuspid valve regurgitation is  mild to moderate.  6. The aortic valve is severely calcified and restricted. Aortic valve  regurgitation is moderate. Severe aortic valve stenosis. Aortic valve  area, by VTI measures 0.69 cm. Aortic valve mean gradient measures 42.0  mmHg.  7. There is Severe (Grade IV) atheroma plaque involving the transverse  and descending aorta.  8. Possible tiny PFO with scant color flow seen between atrial septal   flap, with redundant atrial septum. Agitated saline contrast bubble study  was negative, with no evidence of any interatrial shunt.     Neuro/Psych PSYCHIATRIC DISORDERS Anxiety CVA, Residual Symptoms    GI/Hepatic PUD, GIB    Endo/Other    Renal/GU ESRF and DialysisRenal disease (MWF; K+ 4.2 on 4/7)     Musculoskeletal  (+) Arthritis ,   Abdominal   Peds  Hematology  (+) Blood dyscrasia (Plavix), anemia ,  Leukocytosis, WBC > 30k    Anesthesia Other Findings Covid neg 4/13  Reproductive/Obstetrics                            Anesthesia Physical  Anesthesia Plan  ASA: IV  Anesthesia Plan: MAC   Post-op Pain Management:    Induction: Intravenous  PONV Risk Score and Plan: 1 and Propofol infusion and Treatment may vary due to age or medical condition  Airway Management Planned: Nasal Cannula and Natural Airway  Additional Equipment: None  Intra-op Plan:   Post-operative Plan:   Informed Consent: I have reviewed the patients History and Physical, chart, labs and discussed the procedure including the risks, benefits and alternatives for the proposed anesthesia with the patient or authorized representative who has indicated his/her understanding and acceptance.       Plan Discussed with: CRNA and Anesthesiologist  Anesthesia Plan Comments:         Anesthesia Quick Evaluation

## 2019-09-04 NOTE — Progress Notes (Signed)
Tele notified RN patient had long runs of V tach. Patient denied chest pain or discomfort. RN notified provider. Provider to consult cardiology. EKG done and showed R bundle branch block and QTc of 530. Will continue to monitor patient.

## 2019-09-04 NOTE — Interval H&P Note (Signed)
History and Physical Interval Note:  09/04/2019 10:48 AM  Joseph Maryland Sr.  has presented today for surgery, with the diagnosis of hematochezia,  anemia.  The various methods of treatment have been discussed with the patient and family. After consideration of risks, benefits and other options for treatment, the patient has consented to  Procedure(s): ESOPHAGOGASTRODUODENOSCOPY (EGD) WITH PROPOFOL (N/A) COLONOSCOPY WITH PROPOFOL (N/A) as a surgical intervention.  The patient's history has been reviewed, patient examined, no change in status, stable for surgery.  I have reviewed the patient's chart and labs.  Questions were answered to the patient's satisfaction.     [place]

## 2019-09-04 NOTE — Anesthesia Postprocedure Evaluation (Signed)
Anesthesia Post Note  Patient: Joseph SPIVACK Sr.  Procedure(s) Performed: ESOPHAGOGASTRODUODENOSCOPY (EGD) WITH PROPOFOL (N/A ) BIOPSY     Patient location during evaluation: PACU Anesthesia Type: MAC Level of consciousness: awake and alert Pain management: pain level controlled Vital Signs Assessment: post-procedure vital signs reviewed and stable Respiratory status: spontaneous breathing, nonlabored ventilation, respiratory function stable and patient connected to nasal cannula oxygen Cardiovascular status: stable and blood pressure returned to baseline Postop Assessment: no apparent nausea or vomiting Anesthetic complications: no    Last Vitals:  Vitals:   09/04/19 1219 09/04/19 1229  BP: 113/86 (!) 134/101  Pulse: 98 (!) 106  Resp: 18 18  Temp: 37.2 C   SpO2: 99% 96%    Last Pain:  Vitals:   09/04/19 1229  TempSrc:   PainSc: 0-No pain                 Catalina Gravel

## 2019-09-04 NOTE — Progress Notes (Signed)
OT Cancellation Note  Patient Details Name: Joseph DEVAUL Sr. MRN: [id number] DOB: 11-10-1940   Cancelled Treatment:    Reason Eval/Treat Not Completed: Patient at procedure or test/ unavailable (EGD); will follow up for OT eval as able.  Lou Cal, OT [facility] Pager [phone] Office [phone]   Raymondo Band 09/04/2019, 11:00 AM

## 2019-09-05 DIAGNOSIS — I472 Ventricular tachycardia: Secondary | ICD-10-CM

## 2019-09-05 DIAGNOSIS — R9431 Abnormal electrocardiogram [ECG] [EKG]: Secondary | ICD-10-CM

## 2019-09-05 LAB — CBC WITH DIFFERENTIAL/PLATELET
Abs Immature Granulocytes: 0.12 10*3/uL — ABNORMAL HIGH (ref 0.00–0.07)
Basophils Absolute: 0 10*3/uL (ref 0.0–0.1)
Basophils Relative: 0 %
Eosinophils Absolute: 0.5 10*3/uL (ref 0.0–0.5)
Eosinophils Relative: 3 %
HCT: 26.5 % — ABNORMAL LOW (ref 39.0–52.0)
Hemoglobin: 8.3 g/dL — ABNORMAL LOW (ref 13.0–17.0)
Immature Granulocytes: 1 %
Lymphocytes Relative: 11 %
Lymphs Abs: 1.7 10*3/uL (ref 0.7–4.0)
MCH: 20.8 pg — ABNORMAL LOW (ref 26.0–34.0)
MCHC: 31.3 g/dL (ref 30.0–36.0)
MCV: 66.4 fL — ABNORMAL LOW (ref 80.0–100.0)
Monocytes Absolute: 1 10*3/uL (ref 0.1–1.0)
Monocytes Relative: 7 %
Neutro Abs: 12.2 10*3/uL — ABNORMAL HIGH (ref 1.7–7.7)
Neutrophils Relative %: 78 %
Platelets: 295 10*3/uL (ref 150–400)
RBC: 3.99 MIL/uL — ABNORMAL LOW (ref 4.22–5.81)
RDW: 22.3 % — ABNORMAL HIGH (ref 11.5–15.5)
WBC: 15.5 10*3/uL — ABNORMAL HIGH (ref 4.0–10.5)
nRBC: 0.1 % (ref 0.0–0.2)

## 2019-09-05 LAB — TYPE AND SCREEN
ABO/RH(D): A POS
Antibody Screen: NEGATIVE
Unit division: 0

## 2019-09-05 LAB — PHOSPHORUS: Phosphorus: 3.9 mg/dL (ref 2.5–4.6)

## 2019-09-05 LAB — BASIC METABOLIC PANEL
Anion gap: 11 (ref 5–15)
BUN: 24 mg/dL — ABNORMAL HIGH (ref 8–23)
CO2: 25 mmol/L (ref 22–32)
Calcium: 8.7 mg/dL — ABNORMAL LOW (ref 8.9–10.3)
Chloride: 104 mmol/L (ref 98–111)
Creatinine, Ser: 4.87 mg/dL — ABNORMAL HIGH (ref 0.61–1.24)
GFR calc Af Amer: 12 mL/min — ABNORMAL LOW (ref >60)
GFR calc non Af Amer: 11 mL/min — ABNORMAL LOW (ref >60)
Glucose, Bld: 87 mg/dL (ref 70–99)
Potassium: 3.2 mmol/L — ABNORMAL LOW (ref 3.5–5.1)
Sodium: 140 mmol/L (ref 135–145)

## 2019-09-05 LAB — BPAM RBC
Blood Product Expiration Date: 202105312359
ISSUE DATE / TIME: 202105080807
Unit Type and Rh: 6200

## 2019-09-05 MED ORDER — CHLORHEXIDINE GLUCONATE CLOTH 2 % EX PADS
6.0000 | MEDICATED_PAD | Freq: Every day | CUTANEOUS | Status: DC
  Administered 2019-09-05 – 2019-09-09 (×4): 6 via TOPICAL

## 2019-09-05 NOTE — Progress Notes (Signed)
Occupational Therapy Evaluation Patient Details Name: [name] Sr. 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  However in the preoperative area, patient noted to have rectal bleeding for which [facility] was consulted and advised that the procedure will need to be postponed.  Patient was subsequently admitted.   Clinical Impression   PTA pt was residing at a SNF. Unsure of level of assistance pt was requiring prior to this admission as pt is a poor historian and extremely lethargic this date. Pt currently requires max to total assist x 2 for self-care and functional transfer tasks. Pt tolerated sitting EOB ~8 min with max assist, noting continual retro lean. When pt is briefly alert he only requires min to mod assist to maintain seated balance. Pt only able to maintain alertness for a few seconds despite max multimodal cues. Pt unable to follow simple one step instructions. Pt assisted back to bed and positioned for comfort. Pt demonstrates decreased ROM, FMC, strength, endurance, balance, sitting/standing  tolerance, activity tolerance, and safety awareness impacting ability to complete self-care and functional transfer tasks. Recommend skilled OT services to address above deficits in order to promote function and prevent further decline. Recommend SNF placement for additional rehab prior to discharge home.     Follow Up Recommendations  SNF;Supervision/Assistance - 24 hour    Equipment Recommendations  Other (comment)(TBD at next level of care)    Recommendations for Other Services       Precautions / Restrictions Precautions Precautions: Fall;Other (comment) Precaution Comments: RLE transmetatarsal, left BKA Restrictions Weight Bearing Restrictions: Yes RLE Weight Bearing: Weight bearing as tolerated LLE Weight Bearing: Non weight bearing      Mobility Bed Mobility Overal bed mobility: Needs Assistance Bed Mobility: Supine to Sit;Sit to Supine     Supine to sit: Total assist;+2 for physical assistance Sit to supine: Total assist;+2 for physical assistance   General bed mobility comments: Extremely lethargic, unable to follow cues to assist.   Transfers                 General transfer comment: Deferred this date. Pt too lethargic, unable to maintain seated balance without max assist.     Balance Overall balance assessment: Needs assistance Sitting-balance support: Bilateral upper extremity supported;Feet supported Sitting balance-Leahy Scale: Poor Sitting balance - Comments: Pt sat EOB ~8 min with max assist. Occasionally only required min to mod assist when max multimodal cues provided. Once fatigue sets in pt requiring max assist.       Standing balance comment: Deferred this date.                            ADL either performed or assessed with clinical judgement   ADL Overall ADL's : Needs assistance/impaired Eating/Feeding: Maximal assistance;Bed level   Grooming: Total  assistance;Bed level   Upper Body Bathing: Total assistance;Bed level    Lower Body Bathing: Total assistance;Bed level   Upper Body Dressing : Total assistance;Bed level   Lower Body Dressing: Total assistance;Bed level   Toilet Transfer: Total assistance(bed pan)   Toileting- Clothing Manipulation and Hygiene: Total assistance;Bed level       Functional mobility during ADLs: Total assistance;+2 for physical assistance;+2 for safety/equipment General ADL Comments: Pt able to sit EOB 8 min requiring max assist for support with intermittent min to mod assist. Continual retro lean. Unable to follow instructions.      Vision         Perception     Praxis      Pertinent Vitals/Pain Pain Assessment: Faces Faces Pain Scale: No hurt     Hand Dominance Right   Extremity/Trunk Assessment Upper Extremity Assessment Upper Extremity Assessment: RUE deficits/detail;LUE deficits/detail;Generalized weakness;Difficult to assess due to impaired cognition RUE Deficits / Details: Unable to follow active ROM instructions. PROM shld flex ~100 degrees. Elbow and wrist PROM WFL. Finger amputation.  LUE Deficits / Details: Unable to follow active ROM instructions. PROM shld flex ~45 degrees. Elbow and wrist PROM WFL. Able to passively extend digits.    Lower Extremity Assessment Lower Extremity Assessment: Defer to PT evaluation       Communication Communication Communication: Expressive difficulties;Other (comment)(Pt very lethargic this date)   Cognition Arousal/Alertness: Lethargic Behavior During Therapy: Flat affect Overall Cognitive Status: No family/caregiver present to determine baseline cognitive functioning                                 General Comments: Pt extremely lethargic this date, unable to maintain alertness longer than a few seconds despite max multimodal cues. Pt unable to follow one step instructions.    General Comments  BP sitting EOB: 87/22mmHg with pt reporting no dizziness. HR 103, O2: 98% on room air.      Exercises     Shoulder Instructions      Home Living Family/patient expects to be discharged to:: Skilled nursing facility     Type of Home: [place]                           Additional Comments: Pt very lethargic this date, unable to provide PLOF. PLOF obtained from chart from previous admission 08/09/19.       Prior Functioning/Environment Level of Independence: Needs assistance  Gait / Transfers Assistance Needed: PLOF obtained from 08/09/19 admission: using RW with assist for mobility, w/c for longer distances (prior to LLE amputation). ADL's / Homemaking Assistance Needed: PLOF obtaind from 08/09/19 admission: some assist with ADLs (LB) from staff at sink (prior to LLE amputation).    Comments: 3 weeks prior to LLE amputation during 08/09/19 admission pt was using a cane, driving to HD, and caring for himself.         OT Problem List: Decreased strength;Decreased range of motion;Decreased activity tolerance;Impaired balance (sitting and/or standing);Decreased coordination;Decreased cognition;Decreased safety awareness;Cardiopulmonary status limiting activity      OT Treatment/Interventions: Self-care/ADL training;Therapeutic exercise;Neuromuscular education;Energy conservation;DME and/or AE instruction;Therapeutic activities;Patient/family education;Balance training;Cognitive remediation/compensation    OT Goals(Current goals can be found in the care plan section) Acute Rehab OT Goals Patient Stated Goal: Pt unable to state Time For Goal Achievement: 09/19/19 Potential to Achieve Goals: Fair ADL Goals Pt Will Perform Eating: with min assist;sitting  Pt Will Perform Grooming: with min assist;sitting Pt Will Perform Upper Body Bathing: with min assist;sitting Pt Will Transfer to Toilet: with mod assist;with +2 assist;bedside commode Additional ADL Goal #1: Pt to tolerate sitting EOB 10 min with min assist, in preparation for ADLs.  OT Frequency: Min  2X/week   Barriers to D/C:            Co-evaluation PT/OT/SLP Co-Evaluation/Treatment: Yes Reason for Co-Treatment: For patient/therapist safety;To address functional/ADL transfers   OT goals addressed during session: ADL's and self-care;Strengthening/ROM      AM-PAC OT "6 Clicks" Daily Activity     Outcome Measure Help from another person eating meals?: A Lot Help from another person taking care of personal grooming?: Total Help from another person toileting, which includes using toliet, bedpan, or urinal?: Total Help from another person bathing (including washing, rinsing, drying)?: Total Help from another person to put on and taking off regular upper body clothing?: Total Help from another person to put on and taking off regular lower body clothing?: Total 6 Click Score: 7   End of Session Nurse Communication: Mobility status  Activity Tolerance: Patient limited by fatigue;Patient limited by lethargy Patient left: in bed;with call bell/phone within reach;with bed alarm set  OT Visit Diagnosis: Other abnormalities of gait and mobility (R26.89);Muscle weakness (generalized) (M62.81)                Time: 0981-1914 OT Time Calculation (min): 23 min Charges:  OT General Charges $OT Visit: 1 Visit OT Evaluation $OT Eval Moderate Complexity: 1 Mod  Mauri Brooklyn OTR/L [phone]  Mauri Brooklyn 09/05/2019, 12:51 PM

## 2019-09-05 NOTE — Progress Notes (Signed)
[facility] Progress Note   Subjective:   Patient seen and examined at bedside in room.  Tired again today, not sleeping well.  Denies CP, SOB, n/v/d, abdominal pain, leg pain, and dizziness.   Objective Vitals:   09/05/19 0100 09/05/19 0400 09/05/19 0650 09/05/19 0814  BP: 127/62   98/81  Pulse: 99   95  Resp:  18  19  Temp:  99.1 F (37.3 C)  98.7 F (37.1 C)  TempSrc:  Axillary  Oral  SpO2: 91%   97%  Weight:   58.7 kg   Height:       Physical Exam General:chronically ill appearing, frail male in NAD Heart:RRR, +2/5 systolic murmur Lungs:CTAB anteriolaterally Abdomen:soft, NTND Extremities:L BKA dressed, R TMA, no edema Dialysis Access: L thigh AVG +b   Filed Weights   09/03/19 1915 09/04/19 0309 09/05/19 0650  Weight: 58.9 kg 62.4 kg 58.7 kg    Intake/Output Summary (Last 24 hours) at 09/05/2019 1047 Last data filed at 09/04/2019 1957 Gross per 24 hour  Intake 280 ml  Output --  Net 280 ml    Additional Objective Labs: Basic Metabolic Panel: Recent Labs  Lab 09/03/19 1358 09/05/19 0446  NA 136 140  K 4.2 3.2*  CL 98 104  CO2 26 25  GLUCOSE 78 87  BUN 39* 24*  CREATININE 5.70* 4.87*  CALCIUM 9.2 8.7*   Liver Function Tests: Recent Labs  Lab 09/03/19 1358  AST 16  ALT 11  ALKPHOS 65  BILITOT 1.0  PROT 6.4*  ALBUMIN 1.7*   CBC: Recent Labs  Lab 09/03/19 1358 09/03/19 1358 09/03/19 2107 09/03/19 2107 09/04/19 0615 09/04/19 1948 09/05/19 0446  WBC 17.9*   < > 16.1*  --  16.4*  --  15.5*  NEUTROABS  --   --   --   --   --   --  12.2*  HGB 8.2*   < > 8.1*   < > 7.3* 8.5* 8.3*  HCT 26.1*   < > 25.9*   < > 23.5* 25.8* 26.5*  MCV 62.7*  --  63.3*  --  63.7*  --  66.4*  PLT 329   < > 328  --  293  --  295   < > = values in this interval not displayed.   Iron Studies:  Recent Labs    09/03/19 1530  IRON 14*  TIBC 132*  FERRITIN 371*   Lab Results  Component Value Date   INR 1.1 12/01/2018   INR 1.03 01/01/2018   INR  1.03 11/27/2017   Studies/Results: DG CHEST PORT 1 VIEW  Result Date: 09/03/2019 CLINICAL DATA:  Central line placement. EXAM: PORTABLE CHEST 1 VIEW FINDINGS: Left IJ central venous catheter is coursing into the SVC but has a slight bend or curvature at its tip and it could be in the azygos vein. The cardiac silhouette, mediastinal and hilar contours are within normal limits and stable. There is moderate tortuosity and calcification of the thoracic aorta. Stable eventration of the left hemidiaphragm with some overlying vascular crowding and atelectasis. Prominent skin fold over the left chest. No pneumothorax. IMPRESSION: 1. Left IJ central venous catheter is in the SVC but the tip is likely in the azygos vein. 2. No acute cardiopulmonary findings. Electronically Signed   By: Marijo Sanes M.D.   On: 09/03/2019 13:34    Medications: . sodium chloride    . sodium chloride    . sodium chloride Stopped (09/04/19 1236)  . [  START ON 09/06/2019] ferric gluconate (FERRLECIT/NULECIT) IV     . amiodarone  200 mg Oral BID  . vitamin C  500 mg Oral Q24H  . atorvastatin  40 mg Oral q1800  . B-complex with vitamin C  1 tablet Oral Daily  . Chlorhexidine Gluconate Cloth  6 each Topical Q0600  . cholecalciferol  1,000 Units Oral Daily  . cinacalcet  60 mg Oral QHS  . melatonin  3 mg Oral QHS  . midodrine  10 mg Oral TID WC  . pantoprazole (PROTONIX) IV  40 mg Intravenous Q12H  . sodium chloride flush  10-40 mL Intracatheter Q12H  . sodium chloride flush  3 mL Intravenous Q12H  . sodium chloride flush  3 mL Intravenous Q12H    Dialysis Orders: [facility] - waiting for orders from OP center.  Assessment/Plan: 1. GIB - EGD w/duodenal bulb ulcer. Gastric bx results pending. PPI BID started, to monitor close d/t prolonged QT. Unable to complete colonoscopy. Hgb stable s/p 1unit pRBC. 2. L BKA wound dehiscence-scheduled for AKA 5/7, cancelled d/t GIB. Per cards mod-high surgical risk. per  ortho 3. Ventricular ectopy - asymptomatic. Cards starting amiodarone PO.  4. ESRD- On HD MWF. K 3.2.  Orders written for HD tomorrow per regular schedule using 4K bath.  5. Hypotension/volume- Blood pressure soft. Does not appear volume overloaded. On midodrine TID.   Net UF removed last HD757mL.  6. Anemiaof CKD- Hgb stable at 8.3 s/p 1unit pRBC. Tsat 11%, iron ordered.   7. Secondary Hyperparathyroidism -Ca in goal. Checking phos. Waiting for OP orders for VDRA. Continue sensipar 8. Nutrition- Renal diet w/fluid restrictions 9. Diabetes 10. Hx CVA 11. AS - not candidate for TAVR. 12. Chronic diastolic HF  Jen Mow, PA-C [facility] Pager: [phone] 09/05/2019,10:47 AM  LOS: 2 days

## 2019-09-05 NOTE — Progress Notes (Signed)
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 Gala Romney: 3 tubular adenomas, pancolonic diverticulosis  DU related to NSAIDs: EGD yesterday revealed a duodenal bulb ulcer without high risk stigmata of bleeding. There has no been additional overt bleeding since he was hospitalized. Hemoglobin is overall stable. Potential risks with both PPI therapy and H2Blockers due to QTc. Discussed with Dr. Starla Link yesterday. PPI BID with close monitoring. Avoid all NSAIDs. Follow-up gastric biopsy results.   GI blood loss anemia in the setting of anemia of chronic disease: Hemoglobin has been stable following 1 unit PRBC 09/04/19. Likely due to duodenal ulcer. Single AVM present on EGD but not likely to be the source at this time.  Although we had originally planned a colonoscopy yesterday, he had formed brown stool yesterday despite completing the full Moviprep. No further attempt at colonoscopy planned at this time. Would reconsider with additional overt bleeding. Consider outpatient surveillance colonoscopy in the future if clinically appropriate.    Plan: - PPI BID (ideally for 8 weeks) with close QT monitoring - Avoid all NSAIDS - Await gastric biopsies - Serial hgb/hct with transfusion as indicated - No inpatient colonoscopy planned at this time - May resume Plavix    GI will move to stand-by. Please call the on-call gastroenterologist with any additional questions or concerns during this hospitalization.    Subjective: I awoke Mr. Cruey this morning. He denied any complaints. No family present at the time of my evaluation. I reviewed his care with Jackelyn Poling, RN. No bowel movements or overt bleeding noted over night.    Objective:  Vital signs in last 24 hours: Temp:  [98.2 F (36.8 C)-100 F (37.8 C)] 99.1 F (37.3 C) (05/09 0400) Pulse Rate:  [98-111] 99 (05/09 0100) Resp:  [18-22] 18 (05/09 0400) BP: (92-134)/(48-101) 127/62 (05/09 0100) SpO2:  [91 %-99 %] 91 % (05/09 0100) Weight:  [58.7 kg] 58.7 kg (05/09 0650)   General:   Alert, in NAD Abdomen:  Soft. Nontender. Nondistended. Normal bowel sounds. No rebound or guarding. Neurologic:  Alert and  oriented x4;  grossly normal neurologically. Psych:  Alert and cooperative. Normal mood and affect.   Lab Results: Recent Labs    09/03/19 2107 09/03/19 2107 09/04/19 0615 09/04/19 1948 09/05/19 0446  WBC 16.1*  --  16.4*  --  15.5*  HGB 8.1*   < > 7.3* 8.5* 8.3*  HCT 25.9*   < > 23.5* 25.8* 26.5*  PLT 328  --  293  --  295   < > = values in this interval not displayed.   BMET Recent Labs    09/03/19 1358 09/05/19 0446  NA 136 140  K 4.2 3.2*  CL 98 104  CO2 26 25  GLUCOSE 78 87  BUN 39* 24*  CREATININE 5.70* 4.87*  CALCIUM 9.2 8.7*   LFT Recent Labs    09/03/19 1358  PROT 6.4*  ALBUMIN 1.7*  AST 16  ALT 11  ALKPHOS 65  BILITOT 1.0    DG CHEST PORT 1 VIEW  Result Date: 09/03/2019 CLINICAL DATA:  Central line placement. EXAM: PORTABLE CHEST 1 VIEW FINDINGS: Left IJ central venous catheter is coursing into the SVC but has a slight bend or curvature  at its tip and it could be in the azygos vein. The cardiac silhouette, mediastinal and hilar contours are within normal limits and stable. There is moderate tortuosity and calcification of the thoracic aorta. Stable eventration of the left hemidiaphragm with some overlying vascular crowding and atelectasis. Prominent skin fold over the left chest. No pneumothorax. IMPRESSION: 1. Left IJ central venous catheter is in the SVC but the tip is likely in the azygos vein. 2. No acute cardiopulmonary findings. Electronically Signed   By: Marijo Sanes M.D.   On: 09/03/2019 13:34    [place]  09/05/2019, 8:12 AM

## 2019-09-05 NOTE — Evaluation (Signed)
Physical Therapy Evaluation Patient Details Name: Joseph HENDERSHOTT Sr. MRN: [id number] DOB: Jun 03, 1940 Today's Date: 09/05/2019   History of Present Illness  Pt is a 79 y/o male with PMH of R LE transmetatarsal amp, HTN, arthritis, CVA with residual L sided weakness, arthritis, anxiety, ESRD M/W/F, recent fall with R hip avulasion fx WBAT with no medical mgmt.  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 Readmitted for L AKA but found to have rectal bleeding and sx postponed.   Clinical Impression  Pt admitted with above diagnosis and presents to PT with functional limitations due to deficits listed below (See PT problem list). Pt needs skilled PT to maximize independence and safety to allow discharge back to SNF. Today pt very lethargic and with little interaction.      Follow Up Recommendations SNF    Equipment Recommendations  None recommended by PT    Recommendations for Other Services       Precautions / Restrictions Precautions Precautions: Fall;Other (comment) Precaution Comments: RLE transmetatarsal, left BKA Restrictions Weight Bearing Restrictions: Yes RLE Weight Bearing: Weight bearing as tolerated LLE Weight Bearing: Non weight bearing      Mobility  Bed Mobility Overal bed mobility: Needs Assistance Bed Mobility: Supine to Sit;Sit to Supine     Supine to sit: Total assist;+2 for physical assistance Sit to supine: Total assist;+2 for physical assistance   General bed mobility comments: Assist with all aspects. Extremely lethargic, unable to follow cues to assist.   Transfers                 General transfer comment: Deferred this date. Pt too lethargic, unable to maintain seated balance without max assist.   Ambulation/Gait                Stairs            Wheelchair Mobility    Modified Rankin (Stroke Patients Only)       Balance Overall balance assessment: Needs assistance Sitting-balance support:  Bilateral upper extremity supported;Feet supported Sitting balance-Leahy Scale: Poor Sitting balance - Comments: Pt sat EOB ~8 min with max assist. Occasionally only required min to mod assist when max multimodal cues provided. Once fatigue sets in pt requiring max assist.       Standing balance comment: Deferred this date.                              Pertinent Vitals/Pain Pain Assessment: Faces Faces Pain Scale: No hurt    Home Living Family/patient expects to be discharged to:: Skilled nursing facility     Type of Home: [place]           Additional Comments: Pt very lethargic this date, unable to provide PLOF. PLOF obtained from chart from previous admission 08/09/19.     Prior Function Level of Independence: Needs assistance   Gait / Transfers Assistance Needed: PLOF obtained from 08/09/19 admission: using RW with assist for mobility, w/c for longer distances (prior to LLE amputation). Since lt BKA very limited mobility.  ADL's / Homemaking Assistance Needed: PLOF obtaind from 08/09/19 admission: some assist with ADLs (LB) from staff at sink (prior to LLE amputation).   Comments: 3 weeks prior to LLE amputation during 08/09/19 admission pt was using a cane, driving to HD, and caring for himself.      Hand Dominance   Dominant Hand: Right  Extremity/Trunk Assessment   Upper Extremity Assessment Upper Extremity Assessment: Defer to OT evaluation RUE Deficits / Details: Unable to follow active ROM instructions. PROM shld flex ~100 degrees. Elbow and wrist PROM WFL. Finger amputation.  LUE Deficits / Details: Unable to follow active ROM instructions. PROM shld flex ~45 degrees. Elbow and wrist PROM WFL. Able to passively extend digits.     Lower Extremity Assessment Lower Extremity Assessment: Defer to PT evaluation       Communication   Communication: Expressive difficulties;Other (comment)(Pt very lethargic this date)  Cognition  Arousal/Alertness: Lethargic Behavior During Therapy: Flat affect Overall Cognitive Status: No family/caregiver present to determine baseline cognitive functioning                                 General Comments: Pt extremely lethargic this date, unable to maintain alertness longer than a few seconds despite max multimodal cues. Pt unable to follow one step instructions.       General Comments General comments (skin integrity, edema, etc.): BP sitting EOB: 87/22mmHg with pt reporting no dizziness. HR 103, O2: 98% on room air.     Exercises     Assessment/Plan    PT Assessment Patient needs continued PT services  PT Problem List Decreased strength;Decreased activity tolerance;Decreased balance;Decreased mobility;Decreased cognition       PT Treatment Interventions DME instruction;Functional mobility training;Therapeutic activities;Therapeutic exercise;Balance training;Patient/family education;Cognitive remediation    PT Goals (Current goals can be found in the Care Plan section)  Acute Rehab PT Goals Patient Stated Goal: Pt unable to state PT Goal Formulation: Patient unable to participate in goal setting Time For Goal Achievement: 09/19/19 Potential to Achieve Goals: Fair    Frequency Min 2X/week   Barriers to discharge        Co-evaluation PT/OT/SLP Co-Evaluation/Treatment: Yes Reason for Co-Treatment: For patient/therapist safety PT goals addressed during session: Mobility/safety with mobility;Balance OT goals addressed during session: ADL's and self-care;Strengthening/ROM       AM-PAC PT "6 Clicks" Mobility  Outcome Measure Help needed turning from your back to your side while in a flat bed without using bedrails?: Total Help needed moving from lying on your back to sitting on the side of a flat bed without using bedrails?: Total Help needed moving to and from a bed to a chair (including a wheelchair)?: Total Help needed standing up from a chair  using your arms (e.g., wheelchair or bedside chair)?: Total Help needed to walk in hospital room?: Total Help needed climbing 3-5 steps with a railing? : Total 6 Click Score: 6    End of Session   Activity Tolerance: Patient limited by lethargy Patient left: in bed;with call bell/phone within reach;with bed alarm set   PT Visit Diagnosis: Other abnormalities of gait and mobility (R26.89);Muscle weakness (generalized) (M62.81)    Time: 1041-1101 PT Time Calculation (min) (ACUTE ONLY): 20 min   Charges:   PT Evaluation $PT Eval Moderate Complexity: 1 [place] Pager [phone] Office [place] 09/05/2019, 3:16 PM

## 2019-09-05 NOTE — Progress Notes (Signed)
Patient ID: Joseph Hartshorn., male   DOB: [birth year], 79 y.o.   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  However in the preoperative area, patient noted to have rectal bleeding for which [facility] was consulted and advised that the procedure will need to be postponed.  Patient was subsequently admitted.  Assessment & Plan:   GI bleeding Acute blood loss anemia -Patient had rectal bleeding in the preoperative area and was subsequently admitted. -Status post 1 unit packed red cells transfusion on 09/04/2019.  Monitor H&H.  Hemoglobin 8.3 this morning. -Plavix on hold -Status post EGD on 09/04/2019 which showed duodenal ulcer and probably was the cause of bleeding as per GI.  Hence colonoscopy was not performed and also since patient had not finished drinking his colonoscopy prep.  Continue Protonix every 12 hours with close QT monitoring.  End-stage renal disease on hemodialysis -Nephrology following.  Dialysis as per nephrology schedule  Progressive  gangrenous necrotic changes with stump dehiscence of left transtibial amputation site -Patient was supposed to have outpatient AKA yesterday but instead got admitted for admitting.  Will follow further orthopedics recommendations regarding timing of surgery.  Leukocytosis -From above.  Monitor  Chronic diastolic CHF -Compensated.  Volume managed by dialysis  Hypertension Chronic hypotension -Monitor blood pressure.  Continue midodrine.  Difficult vascular access  -Status post central line placement by CCM on admission  Severe aortic stenosis -As noted by TEE on previous admission.  Avoid hypotension -Cardiology consulted: Not a candidate for TAVR  VT -Cardiology was consulted on 09/04/2019 and patient has been started on oral amiodarone.  Monitor.  Prolonged QTC -Monitor EKG daily.  QTC 510 today.  Iron deficiency anemia Anemia of chronic disease -Monitor H&H.  Plan as above  History of CVA -Plavix on hold due to GI bleed  Generalized deconditioning -Overall prognosis of this patient is guarded to poor.  Palliative care evaluation appreciated: CODE STATUS has been changed to partial code and patient wishes to have CPR and ACLS medications but no intubation or shock.    DVT prophylaxis: Avoid chemical DVT prophylaxis because of GI bleed Code Status: Partial Family Communication: None at bedside Disposition Plan: Status is: Inpatient  Remains inpatient appropriate because:  Might possibly need inpatient AKA as well.    Dispo: The patient is from: SNF              Anticipated d/c is to: SNF              Anticipated d/c date is: > 3 days              Patient currently is not medically stable to d/c.   Consultants: GI/PCCM/IR.  Dr. Sharol Given is aware of the patient's admission  Procedures:  EGD on  09/04/2019 Impression:               - Duodenal ulcer may be the cause of recent overt                            GI bleeding.                           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 Biopsied.                           - Erosive gastropathy with no bleeding and no                            stigmata of recent bleeding. Biopsied.                           - A single non-bleeding angioectasia in the                            duodenum.                           - Small hiatal hernia.                           - Unable to proceed with colonoscopy as planned due                            to incomplete preparation. Patient had formed stool                            on last bowel movement. Recommendation:           - Return patient to hospital ward for ongoing care.                           - Clear liquid diet. Advance as tolerated.                           - No aspirin, ibuprofen, naproxen, or other                            non-steroidal anti-inflammatory drugs.                           - Patient has a prolonged QT. Discussed with Dr.                            Starla Link who agreed with trial of pantoprazole 40 mg                            IV BID with close monitoring of QTc.                           - Will defer colonoscopy at this time given the  challenges that the patient has prepping for the                            procedure. Duodenal ulcer may be the source of                            bleeding.                           - Continue serial hgb/hct with transfusion as                            indicated.  Antimicrobials: None  Subjective: Patient seen and examined at bedside.  Poor historian.  Wakes up slightly, does not answer any questions.  No overnight fever, black or bloody stools reported by nursing staff. Objective: Vitals:   09/05/19 0002 09/05/19 0100 09/05/19 0400 09/05/19 0650  BP: (!) 110/49 127/62    Pulse: 99 99    Resp: 18  18   Temp: 98.5 F (36.9 C)  99.1 F (37.3 C)   TempSrc: Oral  Axillary   SpO2: 97% 91%    Weight:    58.7 kg  Height:        Intake/Output Summary (Last 24 hours) at  09/05/2019 0753 Last data filed at 09/04/2019 1957 Gross per 24 hour  Intake 584 ml  Output --  Net 584 ml   Filed Weights   09/03/19 1915 09/04/19 0309 09/05/19 0650  Weight: 58.9 kg 62.4 kg 58.7 kg    Examination:  General exam: Appears chronically ill.  Very poor historian.  No acute distress. Respiratory system: Bilateral decreased breath sounds at bases with some scattered crackles Cardiovascular system: S1 & S2 heard, currently rate controlled Gastrointestinal system: Abdomen is nondistended, soft and nontender.  Bowel sounds heard extremities: No edema or clubbing.  Left transtibial amputation site dressing present. Central nervous system: Wakes up slightly, hardly answers any questions.  Poor historian no focal neurological deficits. Moving extremities Skin: No other petechia or ecchymosis  psychiatry: Flat affect    Data Reviewed: I have personally reviewed following labs and imaging studies  CBC: Recent Labs  Lab 09/03/19 1358 09/03/19 2107 09/04/19 0615 09/04/19 1948 09/05/19 0446  WBC 17.9* 16.1* 16.4*  --  15.5*  NEUTROABS  --   --   --   --  12.2*  HGB 8.2* 8.1* 7.3* 8.5* 8.3*  HCT 26.1* 25.9* 23.5* 25.8* 26.5*  MCV 62.7* 63.3* 63.7*  --  66.4*  PLT 329 328 293  --  831   Basic Metabolic Panel: Recent Labs  Lab 09/03/19 1358 09/05/19 0446  NA 136 140  K 4.2 3.2*  CL 98 104  CO2 26 25  GLUCOSE 78 87  BUN 39* 24*  CREATININE 5.70* 4.87*  CALCIUM 9.2 8.7*  MG 2.2  --    GFR: Estimated Creatinine Clearance: 10.7 mL/min (A) (by C-G formula based on SCr of 4.87 mg/dL (H)). Liver Function Tests: Recent Labs  Lab 09/03/19 1358  AST 16  ALT 11  ALKPHOS 65  BILITOT 1.0  PROT 6.4*  ALBUMIN 1.7*   No results for input(s): LIPASE, AMYLASE in the last 168 hours. No results for input(s): AMMONIA in the last 168 hours. Coagulation Profile: No results for input(s): INR, PROTIME in the last 168 hours. Cardiac  Enzymes: No results for input(s): CKTOTAL,  CKMB, CKMBINDEX, TROPONINI in the last 168 hours. BNP (last 3 results) No results for input(s): PROBNP in the last 8760 hours. HbA1C: No results for input(s): HGBA1C in the last 72 hours. CBG: No results for input(s): GLUCAP in the last 168 hours. Lipid Profile: No results for input(s): CHOL, HDL, LDLCALC, TRIG, CHOLHDL, LDLDIRECT in the last 72 hours. Thyroid Function Tests: No results for input(s): TSH, T4TOTAL, FREET4, T3FREE, THYROIDAB in the last 72 hours. Anemia Panel: Recent Labs    09/03/19 1530  FERRITIN 371*  TIBC 132*  IRON 14*   Sepsis Labs: No results for input(s): PROCALCITON, LATICACIDVEN in the last 168 hours.  Recent Results (from the past 240 hour(s))  Respiratory Panel by RT PCR (Flu A&B, Covid) - Nasopharyngeal Swab     Status: None   Collection Time: 09/03/19  8:18 AM   Specimen: Nasopharyngeal Swab  Result Value Ref Range Status   SARS Coronavirus 2 by RT PCR NEGATIVE NEGATIVE Final    Comment: (NOTE) SARS-CoV-2 target nucleic acids are NOT DETECTED. The SARS-CoV-2 RNA is generally detectable in upper respiratoy specimens during the acute phase of infection. The lowest concentration of SARS-CoV-2 viral copies this assay can detect is 131 copies/mL. A negative result does not preclude SARS-Cov-2 infection and should not be used as the sole basis for treatment or other patient management decisions. A negative result may occur with  improper specimen collection/handling, submission of specimen other than nasopharyngeal swab, presence of viral mutation(s) within the areas targeted by this assay, and inadequate number of viral copies (<131 copies/mL). A negative result must be combined with clinical observations, patient history, and epidemiological information. The expected result is Negative. Fact Sheet for Patients:  PinkCheek.be Fact Sheet for Healthcare Providers:  GravelBags.it This test is  not yet ap proved or cleared by the [place] FDA and  has been authorized for detection and/or diagnosis of SARS-CoV-2 by FDA under an Emergency Use Authorization (EUA). This EUA will remain  in effect (meaning this test can be used) for the duration of the COVID-19 declaration under Section 564(b)(1) of the Act, 21 U.S.C. section 360bbb-3(b)(1), unless the authorization is terminated or revoked sooner.    Influenza A by PCR NEGATIVE NEGATIVE Final   Influenza B by PCR NEGATIVE NEGATIVE Final    Comment: (NOTE) The Xpert Xpress SARS-CoV-2/FLU/RSV assay is intended as an aid in  the diagnosis of influenza from Nasopharyngeal swab specimens and  should not be used as a sole basis for treatment. Nasal washings and  aspirates are unacceptable for Xpert Xpress SARS-CoV-2/FLU/RSV  testing. Fact Sheet for Patients: PinkCheek.be Fact Sheet for Healthcare Providers: GravelBags.it This test is not yet approved or cleared by the [place] FDA and  has been authorized for detection and/or diagnosis of SARS-CoV-2 by  FDA under an Emergency Use Authorization (EUA). This EUA will remain  in effect (meaning this test can be used) for the duration of the  Covid-19 declaration under Section 564(b)(1) of the Act, 21  U.S.C. section 360bbb-3(b)(1), unless the authorization is  terminated or revoked. Performed at [facility], [place] [address] [place],  [address]          Radiology Studies: DG CHEST PORT 1 VIEW  Result Date: 09/03/2019 CLINICAL DATA:  Central line placement. EXAM: PORTABLE CHEST 1 VIEW FINDINGS: Left IJ central venous catheter is coursing into the SVC but has a slight bend or curvature at its tip and it  could be in the azygos vein. The cardiac silhouette, mediastinal and hilar contours are within normal limits and stable. There is moderate tortuosity and calcification of the thoracic aorta. Stable  eventration of the left hemidiaphragm with some overlying vascular crowding and atelectasis. Prominent skin fold over the left chest. No pneumothorax. IMPRESSION: 1. Left IJ central venous catheter is in the SVC but the tip is likely in the azygos vein. 2. No acute cardiopulmonary findings. Electronically Signed   By: Marijo Sanes M.D.   On: 09/03/2019 13:34        Scheduled Meds: . amiodarone  200 mg Oral BID  . vitamin C  500 mg Oral Q24H  . atorvastatin  40 mg Oral q1800  . B-complex with vitamin C  1 tablet Oral Daily  . Chlorhexidine Gluconate Cloth  6 each Topical Q0600  . cholecalciferol  1,000 Units Oral Daily  . cinacalcet  60 mg Oral QHS  . melatonin  3 mg Oral QHS  . midodrine  10 mg Oral TID WC  . pantoprazole (PROTONIX) IV  40 mg Intravenous Q12H  . sodium chloride flush  10-40 mL Intracatheter Q12H  . sodium chloride flush  3 mL Intravenous Q12H  . sodium chloride flush  3 mL Intravenous Q12H   Continuous Infusions: . sodium chloride    . sodium chloride    . sodium chloride Stopped (09/04/19 1236)  . [START ON 09/06/2019] ferric gluconate (FERRLECIT/NULECIT) IV            Aline August, MD [facility] 09/05/2019, 7:53 AM

## 2019-09-05 NOTE — Plan of Care (Signed)
Patient remains confused but easily reoriented this shift.  Very drowsy majority of day.  Problem: Education: Goal: Knowledge of General Education information will improve Description: Including pain rating scale, medication(s)/side effects and non-pharmacologic comfort measures Outcome: Progressing   Problem: Health Behavior/Discharge Planning: Goal: Ability to manage health-related needs will improve Outcome: Progressing   Problem: Clinical Measurements: Goal: Ability to maintain clinical measurements within normal limits will improve Outcome: Progressing Goal: Will remain free from infection Outcome: Progressing Goal: Diagnostic test results will improve Outcome: Progressing Goal: Respiratory complications will improve Outcome: Progressing Goal: Cardiovascular complication will be avoided Outcome: Progressing   Problem: Activity: Goal: Risk for activity intolerance will decrease Outcome: Progressing   Problem: Nutrition: Goal: Adequate nutrition will be maintained Outcome: Progressing   Problem: Coping:  Goal: Level of anxiety will decrease Outcome: Progressing   Problem: Elimination: Goal: Will not experience complications related to bowel motility Outcome: Progressing Goal: Will not experience complications related to urinary retention Outcome: Progressing   Problem: Pain Managment: Goal: General experience of comfort will improve Outcome: Progressing   Problem: Safety: Goal: Ability to remain free from injury will improve Outcome: Progressing   Problem: Skin Integrity: Goal: Risk for impaired skin integrity will decrease Outcome: Progressing

## 2019-09-06 ENCOUNTER — Other Ambulatory Visit: Payer: Self-pay | Admitting: Physician Assistant

## 2019-09-06 ENCOUNTER — Encounter (HOSPITAL_COMMUNITY): Payer: Self-pay | Admitting: Internal Medicine

## 2019-09-06 LAB — PATHOLOGIST SMEAR REVIEW

## 2019-09-06 LAB — CBC WITH DIFFERENTIAL/PLATELET
Abs Immature Granulocytes: 0.11 10*3/uL — ABNORMAL HIGH (ref 0.00–0.07)
Basophils Absolute: 0.1 10*3/uL (ref 0.0–0.1)
Basophils Relative: 0 %
Eosinophils Absolute: 0.9 10*3/uL — ABNORMAL HIGH (ref 0.0–0.5)
Eosinophils Relative: 5 %
HCT: 26.4 % — ABNORMAL LOW (ref 39.0–52.0)
Hemoglobin: 8.6 g/dL — ABNORMAL LOW (ref 13.0–17.0)
Immature Granulocytes: 1 %
Lymphocytes Relative: 13 %
Lymphs Abs: 2.4 10*3/uL (ref 0.7–4.0)
MCH: 21.5 pg — ABNORMAL LOW (ref 26.0–34.0)
MCHC: 32.6 g/dL (ref 30.0–36.0)
MCV: 66 fL — ABNORMAL LOW (ref 80.0–100.0)
Monocytes Absolute: 1.1 10*3/uL — ABNORMAL HIGH (ref 0.1–1.0)
Monocytes Relative: 6 %
Neutro Abs: 13.5 10*3/uL — ABNORMAL HIGH (ref 1.7–7.7)
Neutrophils Relative %: 75 %
Platelets: 288 10*3/uL (ref 150–400)
RBC: 4 MIL/uL — ABNORMAL LOW (ref 4.22–5.81)
RDW: 22.9 % — ABNORMAL HIGH (ref 11.5–15.5)
WBC: 18 10*3/uL — ABNORMAL HIGH (ref 4.0–10.5)
nRBC: 0.2 % (ref 0.0–0.2)

## 2019-09-06 LAB — MRSA PCR SCREENING: MRSA by PCR: NEGATIVE

## 2019-09-06 LAB — BASIC METABOLIC PANEL
Anion gap: 11 (ref 5–15)
BUN: 31 mg/dL — ABNORMAL HIGH (ref 8–23)
CO2: 26 mmol/L (ref 22–32)
Calcium: 8.4 mg/dL — ABNORMAL LOW (ref 8.9–10.3)
Chloride: 103 mmol/L (ref 98–111)
Creatinine, Ser: 6.15 mg/dL — ABNORMAL HIGH (ref 0.61–1.24)
GFR calc Af Amer: 9 mL/min — ABNORMAL LOW (ref >60)
GFR calc non Af Amer: 8 mL/min — ABNORMAL LOW (ref >60)
Glucose, Bld: 88 mg/dL (ref 70–99)
Potassium: 3.4 mmol/L — ABNORMAL LOW (ref 3.5–5.1)
Sodium: 140 mmol/L (ref 135–145)

## 2019-09-06 LAB — MAGNESIUM: Magnesium: 2.1 mg/dL (ref 1.7–2.4)

## 2019-09-06 MED ORDER — PANTOPRAZOLE SODIUM 40 MG PO TBEC
40.0000 mg | DELAYED_RELEASE_TABLET | Freq: Two times a day (BID) | ORAL | Status: DC
  Administered 2019-09-06 – 2019-09-14 (×16): 40 mg via ORAL
  Filled 2019-09-06 (×16): qty 1

## 2019-09-06 MED ORDER — DARBEPOETIN ALFA 100 MCG/0.5ML IJ SOSY
100.0000 ug | PREFILLED_SYRINGE | INTRAMUSCULAR | Status: DC
  Filled 2019-09-06: qty 0.5

## 2019-09-06 NOTE — Progress Notes (Signed)
Patient strangled while eating soft diet, Advance to DYS 2 for lunch. Patient need meds crushed as well with apple sauce. Patient could benefit from a speech consult.

## 2019-09-06 NOTE — Progress Notes (Signed)
Patient ID: [name]., male   DOB: 05-Feb-1941, 79 y.o.   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  However in the preoperative area, patient noted to have rectal bleeding for which [facility] was consulted and advised that the procedure will need to be postponed.  Patient was subsequently admitted.  Assessment & Plan:   GI bleeding Acute blood loss anemia -Patient had rectal bleeding in the preoperative area and was subsequently admitted. -Status post 1 unit packed red cells transfusion on 09/04/2019.  Monitor H&H.  Hemoglobin 8.3 this morning. -Plavix on hold -Status post EGD on 09/04/2019 which showed duodenal ulcer and probably was the cause of bleeding as per GI.  Hence colonoscopy was not performed and also since patient had not finished drinking his colonoscopy prep.  Continue Protonix every 12 hours with close QT monitoring.  Outpatient follow-up with GI.  Will hold off Plavix for now in anticipation of AKA.  End-stage renal disease on  hemodialysis -Nephrology following.  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  Plan for AKA probable on Wednesday by orthopedics.  Leukocytosis -From above.  Worsening.  Monitor  Chronic diastolic CHF -Compensated.  Volume managed by dialysis  Hypertension Chronic hypotension -Monitor blood pressure.  Continue midodrine.  Difficult vascular access  -Status post central line placement by CCM on admission  Severe aortic stenosis -As noted by TEE on previous admission.  Avoid hypotension -Cardiology consulted: Not a candidate for TAVR  VT -Cardiology was consulted on 09/04/2019 and patient has been started on oral amiodarone.  Follow further cardiology recommendations.  Monitor.  Prolonged QTC -Monitor EKG daily.    Iron deficiency anemia Anemia of chronic disease -Monitor H&H.  Plan as above  History of CVA -Plavix will remain on hold as above  Generalized deconditioning -Overall prognosis of this patient is guarded to poor.  Palliative care evaluation appreciated: CODE STATUS has been changed to partial code and patient wishes to have CPR and ACLS medications but no intubation or shock.    DVT prophylaxis: Avoid chemical DVT prophylaxis because of GI bleed Code Status: Partial Family Communication: None at bedside Disposition Plan: Status is: Inpatient  Remains inpatient appropriate because: Plan for AKA on Wednesday.   Dispo: The patient is from: SNF              Anticipated d/c is to: SNF              Anticipated d/c date is: > 3 days              Patient currently is not medically stable to d/c.  Consultants: GI/PCCM/IR.  Dr. Sharol Given is aware of the patient's admission  Procedures:  EGD on 09/04/2019 Impression:               - Duodenal ulcer may be the cause of recent overt                             GI bleeding.                           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 Biopsied.                           - Erosive gastropathy with no bleeding and no                            stigmata of recent bleeding. Biopsied.                           - A single non-bleeding angioectasia in the                            duodenum.                           - Small hiatal hernia.                           - Unable to proceed with colonoscopy as planned due                            to incomplete preparation. Patient had formed stool                            on last bowel movement. Recommendation:           - Return patient to hospital ward for ongoing care.                           - Clear liquid diet. Advance as tolerated.                           - No aspirin, ibuprofen, naproxen, or other                            non-steroidal anti-inflammatory drugs.                           - Patient has a prolonged QT. Discussed with Dr.                            Starla Link who agreed with trial of pantoprazole 40 mg                            IV BID with close monitoring of QTc.                           -  Will defer colonoscopy at this time given the                            challenges that the patient has prepping for the                            procedure. Duodenal ulcer may be the source of                            bleeding.                           - Continue serial hgb/hct with transfusion as                            indicated.  Antimicrobials: None  Subjective: Patient seen and examined at bedside.  Extremely poor historian.  More awake today but does not answer most questions.  No overnight black or bloody stools reported by nursing staff.   Objective: Vitals:   09/06/19 0000 09/06/19 0120 09/06/19 0400 09/06/19 0443  BP:   114/66   Pulse:  81 70   Resp: 18  13   Temp: 97.7 F (36.5 C)  98.9 F (37.2 C)   TempSrc: Axillary  Axillary   SpO2:  98% 93%    Weight:    64.5 kg  Height:        Intake/Output Summary (Last 24 hours) at 09/06/2019 0756 Last data filed at 09/05/2019 1710 Gross per 24 hour  Intake 420 ml  Output --  Net 420 ml   Filed Weights   09/04/19 0309 09/05/19 0650 09/06/19 0443  Weight: 62.4 kg 58.7 kg 64.5 kg    Examination:  General exam: Chronically ill.  Very poor historian.  No distress. Respiratory system: Bilateral decreased breath sounds at bases with some crackles.  No wheezing  cardiovascular system: Rate controlled, S1-S2 heard Gastrointestinal system: Abdomen is nondistended, soft and nontender.  Normal bowel sounds are heard  extremities: No cyanosis or edema.  Left transtibial amputation site dressing present. Central nervous system: More awake today.  Poor historian; no focal neurological deficits. Moving extremities Skin: No other ecchymosis or petechiae  psychiatry: Could not be assessed because of mental status    Data Reviewed: I have personally reviewed following labs and imaging studies  CBC: Recent Labs  Lab 09/03/19 1358 09/03/19 1358 09/03/19 2107 09/04/19 0615 09/04/19 1948 09/05/19 0446 09/06/19 0328  WBC 17.9*  --  16.1* 16.4*  --  15.5* 18.0*  NEUTROABS  --   --   --   --   --  12.2* 13.5*  HGB 8.2*   < > 8.1* 7.3* 8.5* 8.3* 8.6*  HCT 26.1*   < > 25.9* 23.5* 25.8* 26.5* 26.4*  MCV 62.7*  --  63.3* 63.7*  --  66.4* 66.0*  PLT 329  --  328 293  --  295 288   < > = values in this interval not displayed.   Basic Metabolic Panel: Recent Labs  Lab 09/03/19 1358 09/05/19 0446 09/06/19 0328  NA 136 140 140  K 4.2 3.2* 3.4*  CL 98 104 103  CO2 26 25 26   GLUCOSE 78 87 88  BUN 39* 24* 31*  CREATININE 5.70* 4.87* 6.15*  CALCIUM 9.2  8.7* 8.4*  MG 2.2  --  2.1  PHOS  --  3.9  --    GFR: Estimated Creatinine Clearance: 9.2 mL/min (A) (by C-G formula based on SCr of 6.15 mg/dL (H)). Liver Function Tests: Recent Labs  Lab 09/03/19 1358  AST 16  ALT 11  ALKPHOS 65   BILITOT 1.0  PROT 6.4*  ALBUMIN 1.7*   No results for input(s): LIPASE, AMYLASE in the last 168 hours. No results for input(s): AMMONIA in the last 168 hours. Coagulation Profile: No results for input(s): INR, PROTIME in the last 168 hours. Cardiac Enzymes: No results for input(s): CKTOTAL, CKMB, CKMBINDEX, TROPONINI in the last 168 hours. BNP (last 3 results) No results for input(s): PROBNP in the last 8760 hours. HbA1C: No results for input(s): HGBA1C in the last 72 hours. CBG: No results for input(s): GLUCAP in the last 168 hours. Lipid Profile: No results for input(s): CHOL, HDL, LDLCALC, TRIG, CHOLHDL, LDLDIRECT in the last 72 hours. Thyroid Function Tests: No results for input(s): TSH, T4TOTAL, FREET4, T3FREE, THYROIDAB in the last 72 hours. Anemia Panel: Recent Labs    09/03/19 1530  FERRITIN 371*  TIBC 132*  IRON 14*   Sepsis Labs: No results for input(s): PROCALCITON, LATICACIDVEN in the last 168 hours.  Recent Results (from the past 240 hour(s))  Respiratory Panel by RT PCR (Flu A&B, Covid) - Nasopharyngeal Swab     Status: None   Collection Time: 09/03/19  8:18 AM   Specimen: Nasopharyngeal Swab  Result Value Ref Range Status   SARS Coronavirus 2 by RT PCR NEGATIVE NEGATIVE Final    Comment: (NOTE) SARS-CoV-2 target nucleic acids are NOT DETECTED. The SARS-CoV-2 RNA is generally detectable in upper respiratoy specimens during the acute phase of infection. The lowest concentration of SARS-CoV-2 viral copies this assay can detect is 131 copies/mL. A negative result does not preclude SARS-Cov-2 infection and should not be used as the sole basis for treatment or other patient management decisions. A negative result may occur with  improper specimen collection/handling, submission of specimen other than nasopharyngeal swab, presence of viral mutation(s) within the areas targeted by this assay, and inadequate number of viral copies (<131 copies/mL). A negative  result must be combined with clinical observations, patient history, and epidemiological information. The expected result is Negative. Fact Sheet for Patients:  PinkCheek.be Fact Sheet for Healthcare Providers:  GravelBags.it This test is not yet ap proved or cleared by the [place] FDA and  has been authorized for detection and/or diagnosis of SARS-CoV-2 by FDA under an Emergency Use Authorization (EUA). This EUA will remain  in effect (meaning this test can be used) for the duration of the COVID-19 declaration under Section 564(b)(1) of the Act, 21 U.S.C. section 360bbb-3(b)(1), unless the authorization is terminated or revoked sooner.    Influenza A by PCR NEGATIVE NEGATIVE Final   Influenza B by PCR NEGATIVE NEGATIVE Final    Comment: (NOTE) The Xpert Xpress SARS-CoV-2/FLU/RSV assay is intended as an aid in  the diagnosis of influenza from Nasopharyngeal swab specimens and  should not be used as a sole basis for treatment. Nasal washings and  aspirates are unacceptable for Xpert Xpress SARS-CoV-2/FLU/RSV  testing. Fact Sheet for Patients: PinkCheek.be Fact Sheet for Healthcare Providers: GravelBags.it This test is not yet approved or cleared by the [place] FDA and  has been authorized for detection and/or diagnosis of SARS-CoV-2 by  FDA under an Emergency Use Authorization (EUA). This EUA will remain  in  effect (meaning this test can be used) for the duration of the  Covid-19 declaration under Section 564(b)(1) of the Act, 21  U.S.C. section 360bbb-3(b)(1), unless the authorization is  terminated or revoked. Performed at [facility], [place] [address], [place], [place] [address]   MRSA PCR Screening     Status: None   Collection Time: 09/06/19  4:55 AM   Specimen: Nasopharyngeal  Result Value Ref Range Status   MRSA by PCR NEGATIVE NEGATIVE  Final    Comment:        The GeneXpert MRSA Assay (FDA approved for NASAL specimens only), is one component of a comprehensive MRSA colonization surveillance program. It is not intended to diagnose MRSA infection nor to guide or monitor treatment for MRSA infections. Performed at [facility], [place] [address] [place], [place] [address]          Radiology Studies: No results found.      Scheduled Meds: . amiodarone  200 mg Oral BID  . vitamin C  500 mg Oral Q24H  . atorvastatin  40 mg Oral q1800  . B-complex with vitamin C  1 tablet Oral Daily  . Chlorhexidine Gluconate Cloth  6 each Topical Q0600  . cholecalciferol  1,000 Units Oral Daily  . cinacalcet  60 mg Oral QHS  . melatonin  3 mg Oral QHS  . midodrine  10 mg Oral TID WC  . pantoprazole (PROTONIX) IV  40 mg Intravenous Q12H  . sodium chloride flush  10-40 mL Intracatheter Q12H  . sodium chloride flush  3 mL Intravenous Q12H  . sodium chloride flush  3 mL Intravenous Q12H   Continuous Infusions: . sodium chloride    . sodium chloride    . sodium chloride Stopped (09/04/19 1236)  . ferric gluconate (FERRLECIT/NULECIT) IV            Aline August, MD [facility] 09/06/2019, 7:56 AM

## 2019-09-06 NOTE — Progress Notes (Signed)
Patient admitted on Friday for GI Bleed. Was to undergo Left AKA   HGB has stabilized 8.6 this morning. Plan for Left AKA on Wednesday as long as medically stable

## 2019-09-06 NOTE — Progress Notes (Signed)
[facility] Progress Note   Subjective:   No particular complaints.... due for HD today   Objective Vitals:   09/06/19 0400 09/06/19 0443 09/06/19 0811 09/06/19 1139  BP: 114/66  (!) 102/42 94/82  Pulse: 70  95 74  Resp: 13  18 20   Temp: 98.9 F (37.2 C)  98.6 F (37 C) 98.6 F (37 C)  TempSrc: Axillary  Oral Oral  SpO2: 93%   100%  Weight:  64.5 kg    Height:       Physical Exam General:chronically ill appearing, frail male in NAD Heart:RRR, +0/0 systolic murmur Lungs:CTAB anteriolaterally Abdomen:soft, NTND Extremities:L BKA dressed, R TMA, no edema Dialysis Access: L thigh AVG +b   Filed Weights   09/04/19 0309 09/05/19 0650 09/06/19 0443  Weight: 62.4 kg 58.7 kg 64.5 kg    Intake/Output Summary (Last 24 hours) at 09/06/2019 1259 Last data filed at 09/06/2019 1100 Gross per 24 hour  Intake 350 ml  Output --  Net 350 ml    Additional Objective Labs: Basic Metabolic Panel: Recent Labs  Lab 09/03/19 1358 09/05/19 0446 09/06/19 0328  NA 136 140 140  K 4.2 3.2* 3.4*  CL 98 104 103  CO2 26 25 26   GLUCOSE 78 87 88  BUN 39* 24* 31*  CREATININE 5.70* 4.87* 6.15*  CALCIUM 9.2 8.7* 8.4*  PHOS  --  3.9  --    Liver Function Tests: Recent Labs  Lab 09/03/19 1358  AST 16  ALT 11  ALKPHOS 65  BILITOT 1.0  PROT 6.4*  ALBUMIN 1.7*   CBC: Recent Labs  Lab 09/03/19 1358 09/03/19 1358 09/03/19 2107 09/03/19 2107 09/04/19 0615 09/04/19 0615 09/04/19 1948 09/05/19 0446 09/06/19 0328  WBC 17.9*   < > 16.1*   < > 16.4*  --   --  15.5* 18.0*  NEUTROABS  --   --   --   --   --   --   --  12.2* 13.5*  HGB 8.2*   < > 8.1*   < > 7.3*   < > 8.5* 8.3* 8.6*  HCT 26.1*   < > 25.9*   < > 23.5*   < > 25.8* 26.5* 26.4*  MCV 62.7*  --  63.3*  --  63.7*  --   --  66.4* 66.0*  PLT 329   < > 328   < > 293  --   --  295 288   < > = values in this interval not displayed.   Iron Studies:  Recent Labs    09/03/19 1530  IRON 14*  TIBC 132*   FERRITIN 371*   Lab Results  Component Value Date   INR 1.1 12/01/2018   INR 1.03 01/01/2018   INR 1.03 11/27/2017   Studies/Results: No results found.  Medications: . sodium chloride    . sodium chloride    . sodium chloride Stopped (09/04/19 1236)  . ferric gluconate (FERRLECIT/NULECIT) IV     . amiodarone  200 mg Oral BID  . vitamin C  500 mg Oral Q24H  . atorvastatin  40 mg Oral q1800  . B-complex with vitamin C  1 tablet Oral Daily  . Chlorhexidine Gluconate Cloth  6 each Topical Q0600  . cholecalciferol  1,000 Units Oral Daily  . cinacalcet  60 mg Oral QHS  . melatonin  3 mg Oral QHS  . midodrine  10 mg Oral TID WC  . pantoprazole  40 mg Oral BID  .  sodium chloride flush  10-40 mL Intracatheter Q12H  . sodium chloride flush  3 mL Intravenous Q12H  . sodium chloride flush  3 mL Intravenous Q12H    Dialysis Orders: [facility] - MWF- 4 hours-  Thigh AVG-  BFR 400/DFR800-  2 K bath as OP-  EDW 62.  Gets heparin per tx-  2000 units per hour, epo 4000 and sensipar 60 mg.  Last hgb there was 4/26  9.9   Assessment/Plan: 1. GIB - EGD w/duodenal bulb ulcer. Gastric bx results pending. PPI BID started, to monitor close d/t prolonged QT. Unable to complete colonoscopy. Hgb fairly stable s/p 1unit pRBC upon admission. 2. L BKA wound dehiscence-scheduled for AKA 5/7, cancelled d/t GIB. Per cards mod-high surgical risk. per ortho 3. Ventricular ectopy - asymptomatic. Cards starting amiodarone PO.  4. ESRD- On HD MWF. K 3.2.  Orders written for HD today per regular schedule using 4K bath.   Might  ? need to bump due to high dialysis volume today  5. Hypotension/volume- Blood pressure soft. Does not appear volume overloaded. On midodrine TID.   Net UF removed last HD 724mL.  6. Anemiaof CKD- Hgb stable at 8.3 s/p 1unit pRBC. Tsat 11%, iron ordered.   7. Secondary Hyperparathyroidism -Ca in goal. Phos 3.9 no binder.  Continue sensipar- does not get vit D with  HD-  8. Nutrition- Renal diet w/fluid restrictions  [facility] Pager: [phone] 09/06/2019,12:59 PM  LOS: 3 days

## 2019-09-06 NOTE — Progress Notes (Signed)
Ramonita Lab, RN called made aware that the patient's HD tx has been moved to 09/07/2019.

## 2019-09-07 ENCOUNTER — Other Ambulatory Visit: Payer: Self-pay

## 2019-09-07 LAB — BASIC METABOLIC PANEL
Anion gap: 13 (ref 5–15)
BUN: 37 mg/dL — ABNORMAL HIGH (ref 8–23)
CO2: 24 mmol/L (ref 22–32)
Calcium: 8.2 mg/dL — ABNORMAL LOW (ref 8.9–10.3)
Chloride: 103 mmol/L (ref 98–111)
Creatinine, Ser: 7.05 mg/dL — ABNORMAL HIGH (ref 0.61–1.24)
GFR calc Af Amer: 8 mL/min — ABNORMAL LOW (ref >60)
GFR calc non Af Amer: 7 mL/min — ABNORMAL LOW (ref >60)
Glucose, Bld: 89 mg/dL (ref 70–99)
Potassium: 3.8 mmol/L (ref 3.5–5.1)
Sodium: 140 mmol/L (ref 135–145)

## 2019-09-07 LAB — CBC WITH DIFFERENTIAL/PLATELET
Abs Immature Granulocytes: 0.15 10*3/uL — ABNORMAL HIGH (ref 0.00–0.07)
Basophils Absolute: 0.1 10*3/uL (ref 0.0–0.1)
Basophils Relative: 0 %
Eosinophils Absolute: 0.8 10*3/uL — ABNORMAL HIGH (ref 0.0–0.5)
Eosinophils Relative: 4 %
HCT: 26.5 % — ABNORMAL LOW (ref 39.0–52.0)
Hemoglobin: 8.4 g/dL — ABNORMAL LOW (ref 13.0–17.0)
Immature Granulocytes: 1 %
Lymphocytes Relative: 13 %
Lymphs Abs: 2.6 10*3/uL (ref 0.7–4.0)
MCH: 20.8 pg — ABNORMAL LOW (ref 26.0–34.0)
MCHC: 31.7 g/dL (ref 30.0–36.0)
MCV: 65.6 fL — ABNORMAL LOW (ref 80.0–100.0)
Monocytes Absolute: 1 10*3/uL (ref 0.1–1.0)
Monocytes Relative: 5 %
Neutro Abs: 14.9 10*3/uL — ABNORMAL HIGH (ref 1.7–7.7)
Neutrophils Relative %: 77 %
Platelets: 281 10*3/uL (ref 150–400)
RBC: 4.04 MIL/uL — ABNORMAL LOW (ref 4.22–5.81)
RDW: 23.2 % — ABNORMAL HIGH (ref 11.5–15.5)
WBC: 19.5 10*3/uL — ABNORMAL HIGH (ref 4.0–10.5)
nRBC: 0 % (ref 0.0–0.2)

## 2019-09-07 LAB — SURGICAL PCR SCREEN
MRSA, PCR: NEGATIVE
Staphylococcus aureus: NEGATIVE

## 2019-09-07 MED ORDER — PRO-STAT SUGAR FREE PO LIQD
30.0000 mL | Freq: Two times a day (BID) | ORAL | Status: DC
  Administered 2019-09-07 – 2019-09-17 (×10): 30 mL via ORAL
  Filled 2019-09-07 (×16): qty 30

## 2019-09-07 MED ORDER — DARBEPOETIN ALFA 100 MCG/0.5ML IJ SOSY
PREFILLED_SYRINGE | INTRAMUSCULAR | Status: AC
  Administered 2019-09-07: 100 ug via INTRAVENOUS
  Filled 2019-09-07: qty 0.5

## 2019-09-07 MED ORDER — MIDODRINE HCL 5 MG PO TABS
ORAL_TABLET | ORAL | Status: AC
  Administered 2019-09-07: 10 mg via ORAL
  Filled 2019-09-07: qty 2

## 2019-09-07 MED ORDER — CHLORHEXIDINE GLUCONATE CLOTH 2 % EX PADS
6.0000 | MEDICATED_PAD | Freq: Every day | CUTANEOUS | Status: DC
  Administered 2019-09-09 – 2019-09-12 (×4): 6 via TOPICAL

## 2019-09-07 MED ORDER — CEFAZOLIN SODIUM-DEXTROSE 2-4 GM/100ML-% IV SOLN
2.0000 g | INTRAVENOUS | Status: DC
  Filled 2019-09-07 (×2): qty 100

## 2019-09-07 MED ORDER — NEPRO/CARBSTEADY PO LIQD
237.0000 mL | Freq: Three times a day (TID) | ORAL | Status: DC
  Administered 2019-09-07 – 2019-09-10 (×2): 237 mL via ORAL

## 2019-09-07 NOTE — Progress Notes (Signed)
Patient ID: Catarina Hartshorn., male   DOB: 10/07/1940, 79 y.o.   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  However in the preoperative area, patient noted to have rectal bleeding for which [facility] was consulted and advised that the procedure will need to be postponed.  Patient was subsequently admitted.  Assessment & Plan:   GI bleeding Acute blood loss anemia -Patient had rectal bleeding in the preoperative area and was subsequently admitted. -Status post 1 unit packed red cells transfusion on 09/04/2019.  Monitor H&H.  Hemoglobin 8.4 this morning. -Plavix on hold -Status post EGD on 09/04/2019 which showed duodenal ulcer which probably was the cause of bleeding as per GI.  Hence colonoscopy was not performed and also since patient had not finished drinking his colonoscopy prep.  Continue Protonix every 12 hours with close QT monitoring.  Outpatient follow-up with GI.  Will hold off Plavix for now in anticipation of AKA.  End-stage renal disease on  hemodialysis -Nephrology following.  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  Plan for AKA probable on Wednesday by orthopedics if patient remains medically stable.  Leukocytosis -From above.  Worsening.  Monitor  Chronic diastolic CHF -Compensated.  Volume managed by dialysis  Hypertension Chronic hypotension -Monitor blood pressure.  Continue midodrine.  Difficult vascular access  -Status post central line placement by CCM on admission.  Remove central line after surgical intervention if no need for vascular access.  Severe aortic stenosis -As noted by TEE on previous admission.  Avoid hypotension -Cardiology consulted: Not a candidate for TAVR  VT -Cardiology was consulted on 09/04/2019 and patient has been started on oral amiodarone.  Follow further cardiology recommendations.  Monitor.  Prolonged QTC -Monitor EKG daily.    Iron deficiency anemia Anemia of chronic disease -Monitor H&H.  Plan as above  History of CVA -Plavix will remain on hold as above  Generalized deconditioning -Overall prognosis of this patient is guarded to poor.  Palliative care evaluation appreciated: CODE STATUS has been changed to partial code and patient wishes to have CPR and ACLS medications but no intubation or shock.    DVT prophylaxis: Avoid chemical DVT prophylaxis because of GI bleed Code Status: Partial Family Communication: None at bedside Disposition Plan: Status is: Inpatient  Remains inpatient appropriate because: Plan for AKA today.  Probable discharge back to SNF in 1 to 3 days if postoperative course remained stable.   Dispo: The patient is from: SNF              Anticipated d/c is to: SNF  Anticipated d/c date is: 2 days              Patient currently is not medically stable to  d/c.   Consultants: GI/PCCM/IR/orthopedics  Procedures:  EGD on 09/04/2019 Impression:               - Duodenal ulcer may be the cause of recent overt                            GI bleeding.                           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 Biopsied.                           - Erosive gastropathy with no bleeding and no                            stigmata of recent bleeding. Biopsied.                           - A single non-bleeding angioectasia in the                            duodenum.                           - Small hiatal hernia.                           - Unable to proceed with colonoscopy as planned due                            to incomplete preparation. Patient had formed stool                            on last bowel movement. Recommendation:           - Return patient to hospital ward for ongoing care.                           - Clear liquid diet. Advance as tolerated.                           - No aspirin, ibuprofen, naproxen, or other                            non-steroidal anti-inflammatory drugs.                           - Patient has a prolonged QT. Discussed with Dr.                            Starla Link who agreed with trial of pantoprazole 40 mg                            IV BID with close monitoring of  QTc.                           - Will defer colonoscopy at this time given the                            challenges that the patient has prepping for the                            procedure. Duodenal ulcer may be the source of                            bleeding.                           - Continue serial hgb/hct with transfusion as                            indicated.  Antimicrobials: None  Subjective: Patient seen and examined at bedside undergoing dialysis.  Poor historian.  States that he is hungry.  No overnight fever, vomiting, black or bloody stools reported.    Objective: Vitals:   09/07/19 0401 09/07/19 0657 09/07/19 0703 09/07/19  0730  BP: (!) 148/46 (!) 136/41 (!) 140/47 (!) 139/55  Pulse: 92 96 76 73  Resp: 20 16    Temp: 98.6 F (37 C) 98.3 F (36.8 C)    TempSrc: Axillary Oral    SpO2: 93%     Weight: 63.4 kg 57.8 kg    Height:        Intake/Output Summary (Last 24 hours) at 09/07/2019 0743 Last data filed at 09/06/2019 2126 Gross per 24 hour  Intake 360 ml  Output 1 ml  Net 359 ml   Filed Weights   09/06/19 0443 09/07/19 0401 09/07/19 0657  Weight: 64.5 kg 63.4 kg 57.8 kg    Examination:  General exam: Chronically ill.  Very poor historian.  No acute distress.  Respiratory system: Bilateral decreased breath sounds at bases with basilar crackles.   Cardiovascular system: S1-S2 heard, rate controlled Gastrointestinal system: Abdomen is nondistended, soft and nontender.  Bowel sounds are heard  extremities: No clubbing or cyanosis.  Left transtibial amputation site dressing present. Central nervous system: Awake, does not participate in conversation much.  Poor historian; no focal neurological deficits. Moving extremities Skin: No petechiae or other ecchymosis  psychiatry: Flat affect    Data Reviewed: I have personally reviewed following labs and imaging studies  CBC: Recent Labs  Lab 09/03/19 2107 09/03/19 2107 09/04/19 0615 09/04/19 1948 09/05/19 0446 09/06/19 0328 09/07/19 0110  WBC 16.1*  --  16.4*  --  15.5* 18.0* 19.5*  NEUTROABS  --   --   --   --  12.2* 13.5* 14.9*  HGB 8.1*   < > 7.3* 8.5* 8.3* 8.6* 8.4*  HCT 25.9*   < > 23.5* 25.8* 26.5* 26.4* 26.5*  MCV 63.3*  --  63.7*  --  66.4* 66.0* 65.6*  PLT 328  --  293  --  295 288 281   < > = values in this interval not displayed.   Basic Metabolic Panel: Recent Labs  Lab 09/03/19 1358 09/05/19 0446 09/06/19 0328 09/07/19 0110  NA 136 140 140 140  K 4.2 3.2*  3.4* 3.8  CL 98 104 103 103  CO2 26 25 26 24   GLUCOSE 78 87 88 89  BUN 39* 24* 31* 37*  CREATININE 5.70* 4.87* 6.15* 7.05*  CALCIUM 9.2 8.7* 8.4* 8.2*  MG 2.2   --  2.1  --   PHOS  --  3.9  --   --    GFR: Estimated Creatinine Clearance: 7.3 mL/min (A) (by C-G formula based on SCr of 7.05 mg/dL (H)). Liver Function Tests: Recent Labs  Lab 09/03/19 1358  AST 16  ALT 11  ALKPHOS 65  BILITOT 1.0  PROT 6.4*  ALBUMIN 1.7*   No results for input(s): LIPASE, AMYLASE in the last 168 hours. No results for input(s): AMMONIA in the last 168 hours. Coagulation Profile: No results for input(s): INR, PROTIME in the last 168 hours. Cardiac Enzymes: No results for input(s): CKTOTAL, CKMB, CKMBINDEX, TROPONINI in the last 168 hours. BNP (last 3 results) No results for input(s): PROBNP in the last 8760 hours. HbA1C: No results for input(s): HGBA1C in the last 72 hours. CBG: No results for input(s): GLUCAP in the last 168 hours. Lipid Profile: No results for input(s): CHOL, HDL, LDLCALC, TRIG, CHOLHDL, LDLDIRECT in the last 72 hours. Thyroid Function Tests: No results for input(s): TSH, T4TOTAL, FREET4, T3FREE, THYROIDAB in the last 72 hours. Anemia Panel: No results for input(s): VITAMINB12, FOLATE, FERRITIN, TIBC, IRON, RETICCTPCT in the last 72 hours. Sepsis Labs: No results for input(s): PROCALCITON, LATICACIDVEN in the last 168 hours.  Recent Results (from the past 240 hour(s))  Respiratory Panel by RT PCR (Flu A&B, Covid) - Nasopharyngeal Swab     Status: None   Collection Time: 09/03/19  8:18 AM   Specimen: Nasopharyngeal Swab  Result Value Ref Range Status   SARS Coronavirus 2 by RT PCR NEGATIVE NEGATIVE Final    Comment: (NOTE) SARS-CoV-2 target nucleic acids are NOT DETECTED. The SARS-CoV-2 RNA is generally detectable in upper respiratoy specimens during the acute phase of infection. The lowest concentration of SARS-CoV-2 viral copies this assay can detect is 131 copies/mL. A negative result does not preclude SARS-Cov-2 infection and should not be used as the sole basis for treatment or other patient management decisions. A negative  result may occur with  improper specimen collection/handling, submission of specimen other than nasopharyngeal swab, presence of viral mutation(s) within the areas targeted by this assay, and inadequate number of viral copies (<131 copies/mL). A negative result must be combined with clinical observations, patient history, and epidemiological information. The expected result is Negative. Fact Sheet for Patients:  PinkCheek.be Fact Sheet for Healthcare Providers:  GravelBags.it This test is not yet ap proved or cleared by the [place] FDA and  has been authorized for detection and/or diagnosis of SARS-CoV-2 by FDA under an Emergency Use Authorization (EUA). This EUA will remain  in effect (meaning this test can be used) for the duration of the COVID-19 declaration under Section 564(b)(1) of the Act, 21 U.S.C. section 360bbb-3(b)(1), unless the authorization is terminated or revoked sooner.    Influenza A by PCR NEGATIVE NEGATIVE Final   Influenza B by PCR NEGATIVE NEGATIVE Final    Comment: (NOTE) The Xpert Xpress SARS-CoV-2/FLU/RSV assay is intended as an aid in  the diagnosis of influenza from Nasopharyngeal swab specimens and  should not be used as a sole basis for treatment. Nasal washings and  aspirates are unacceptable for Xpert Xpress SARS-CoV-2/FLU/RSV  testing. Fact Sheet for Patients: PinkCheek.be Fact Sheet for Healthcare Providers: GravelBags.it  This test is not yet approved or cleared by the [place] and  has been authorized for detection and/or diagnosis of SARS-CoV-2 by  FDA under an Emergency Use Authorization (EUA). This EUA will remain  in effect (meaning this test can be used) for the duration of the  Covid-19 declaration under Section 564(b)(1) of the Act, 21  U.S.C. section 360bbb-3(b)(1), unless the authorization is  terminated or  revoked. Performed at [facility], [place] [address], [place], [place] [address]   MRSA PCR Screening     Status: None   Collection Time: 09/06/19  4:55 AM   Specimen: Nasopharyngeal  Result Value Ref Range Status   MRSA by PCR NEGATIVE NEGATIVE Final    Comment:        The GeneXpert MRSA Assay (FDA approved for NASAL specimens only), is one component of a comprehensive MRSA colonization surveillance program. It is not intended to diagnose MRSA infection nor to guide or monitor treatment for MRSA infections. Performed at [facility], [place] [address], [place], [place] [address]          Radiology Studies: No results found.      Scheduled Meds: . amiodarone  200 mg Oral BID  . vitamin C  500 mg Oral Q24H  . atorvastatin  40 mg Oral q1800  . B-complex with vitamin C  1 tablet Oral Daily  . Chlorhexidine Gluconate Cloth  6 each Topical Q0600  . cholecalciferol  1,000 Units Oral Daily  . cinacalcet  60 mg Oral QHS  . darbepoetin (ARANESP) injection - DIALYSIS  100 mcg Intravenous Q Tue-HD  . melatonin  3 mg Oral QHS  . midodrine  10 mg Oral TID WC  . pantoprazole  40 mg Oral BID  . sodium chloride flush  10-40 mL Intracatheter Q12H  . sodium chloride flush  3 mL Intravenous Q12H  . sodium chloride flush  3 mL Intravenous Q12H   Continuous Infusions: . sodium chloride    . sodium chloride    . sodium chloride Stopped (09/04/19 1236)  . ferric gluconate (FERRLECIT/NULECIT) IV 125 mg (09/06/19 2126)          Aline August, MD [facility] 09/07/2019, 7:43 AM

## 2019-09-07 NOTE — Progress Notes (Signed)
Pt transported to dialysis by transporter. Spoke with CCMD that pt is in dialysis. Will continue to monitor pt. Ranelle Oyster, RN

## 2019-09-07 NOTE — Progress Notes (Signed)
Not currently using triple lumen CVC. Nurse reports that patient will be going for surgery tomorrow. This nurse instructed to start the conversation about patient's MD regarding DC CVC for more appropriate line. Nurse VU. Fran Lowes, RN VAST

## 2019-09-07 NOTE — Consult Note (Signed)
[facility] Nurse Consult Note: Patient has just returned from dialysis and complains his bottom is painful and tender. Rn at bedside and advised.  Reason for Consult:stage 3 pressure injury to sacrococcygeal area.  Present on admission. Nonhealing vascular wounds to right metatarsal amputation site and left transtibial amputation site,  Wound type:pressure and vascular Pressure Injury POA: Yes Measurement:sacrum:  4 cm x 4.5 cm x 0.3 cm with fibrin covering coccyx, measures 0.3 cm Right foot at metatarsal amputation site:  3 cm x 2.4 cm eschar to plantar foot at suture line.  Chronic nonhealing Left transtibial amputation site with nonhealing wounds at suture line.  Wound ZHY:[id number] to lower legs.  Sacrum is pale pink, slick and nongranulating. Drainage (amount, consistency, odor) minimal serosanguinous to sacrum and left lower leg.  Right foot eschar dry Periwound:dry skin to lower legs.  Dressing procedure/placement/frequency: Cleanse sacral wound with NS and pat dry.  Apply aquacel to wound bed.  (LAWSON# [id number]) cover with silicone foam.  Paint nonhealing wounds to bilateral  lower leg surgical sites with betadine daily. Cover with dry dressing.  Will implement mattress with low air loss feature  Will not follow at this time.  Please re-consult if needed.  Domenic Moras MSN, RN, FNP-BC CWON Wound, Ostomy, Continence Nurse Pager [phone]

## 2019-09-07 NOTE — Progress Notes (Signed)
Initial Nutrition Assessment  DOCUMENTATION CODES:   Severe malnutrition in context of chronic illness  INTERVENTION:   - Nepro Shake po TID, each supplement provides 425 kcal and 19 grams protein  - Pro-stat 30 ml po BID, each supplement provides 100 kcal and 15 grams protein  - Magic cup TID with meals, each supplement provides 290 kcal and 9 grams of protein  - Continue B-complex with vitamin C  - Encourage adequate PO intake  NUTRITION DIAGNOSIS:   Severe Malnutrition related to chronic illness (ESRD on HD, CHF, non-healing wounds) as evidenced by severe fat depletion, severe muscle depletion, percent weight loss (22.7% weight loss in 7 months).  GOAL:   Patient will meet greater than or equal to 90% of their needs  MONITOR:   PO intake, Supplement acceptance, Labs, Weight trends, Skin, I & O's  REASON FOR ASSESSMENT:   Malnutrition Screening Tool    ASSESSMENT:   79 year old male who presented on 5/07 with left abduction wound dehiscence and scheduled AKA. PMH of PVD, DM, ESRD on HD, CHF, HTN, HLD, s/p multiple finger amputations, s/p right transmetatarsal amputation, s/p left transtibial amputation. Surgery postponed and pt admitted with acute lower GI bleed.  5/08 - s/p EGD showing duodenal ulcer, unable to proceed with colonoscopy  Plan for AKA on 5/12 if pt remains medically stable. S/p HD this AM with 2000 ml net UF.  EDW: 62 kg  Pt currently on a dysphagia 2 diet with tin liquids. Meal completions have been 10-20%  Current weight of 55.7 kg is well below EDW of 62 kg. Reviewed weight history in chart. Pt with progressive weight loss since October 2020. Overall, pt has lost 16.4 kg since 01/29/19. This is a 22.7% weight loss which is severe and significant for timeframe.  Spoke with pt at bedside. Pt reports that his appetite is okay. Pt states that when he is not in the hospital, he eats very well and has 3 meals daily. Pt unable to provide more information  regarding what he eats at meals. When asked if he snacks between meals, pt reports that he does but is unable to provide more details.  Noted ~75% completed orange Magic Cup at bedside. Pt reports that he likes the orange [employer]. Pt reports that he did not eat much at lunch but is doing the best that he can. Pt is amenable to consuming oral nutrition supplements during admission. RD will order Nepro shakes and Pro-stat to aid pt in meeting kcal and protein needs and promote wound healing.  Pt reports that he has lost a lot of weight and had "a bleed that made me lose weight." Pt reports that his UBW is between 158-159 lbs. Current weight is 122.8 lbs.  RD educated pt on the importance of adequate po intake with a focus on protein in maintaining lean muscle mass, preventing additional weight loss, and promoting wound healing. Pt expresses understanding and also states he is "trying the best I can."  Medications reviewed and include: vitamin C 500 mg daily, B-complex with vitamin C, cholecalciferol, Sensipar, protonix  Labs reviewed: hemoglobin 8.4  NUTRITION - FOCUSED PHYSICAL EXAM:    Most Recent Value  Orbital Region  Severe depletion  Upper Arm Region  Severe depletion  Thoracic and Lumbar Region  Severe depletion  Buccal Region  Moderate depletion  Temple Region  Severe depletion  Clavicle Bone Region  Severe depletion  Clavicle and Acromion Bone Region  Severe depletion  Scapular Bone Region  Severe depletion  Dorsal Hand  Moderate depletion  Patellar Region  Severe depletion  Anterior Thigh Region  Severe depletion  Posterior Calf Region  Severe depletion  Edema (RD Assessment)  None  Hair  Reviewed  Eyes  Reviewed  Mouth  Reviewed  Skin  Reviewed  Nails  Reviewed       Diet Order:   Diet Order            Diet NPO time specified Except for: Sips with Meds  Diet effective midnight        DIET DYS 2 Room service appropriate? Yes; Fluid consistency: Thin  Diet  effective now              EDUCATION NEEDS:   Education needs have been addressed  Skin:  Skin Assessment: Skin Integrity Issues: Stage II: sacrum Unstageable: left foot Incisions: left leg  Last BM:  09/06/19 large type 7  Height:   Ht Readings from Last 1 Encounters:  09/03/19 5\' 6"  (1.676 m)    Weight:   Wt Readings from Last 1 Encounters:  09/07/19 55.7 kg    BMI:  Body mass index is 19.82 kg/m.  Estimated Nutritional Needs:   Kcal:  1700-1900  Protein:  80-95 grams  Fluid:  1000 ml + UOP    Gaynell Face, MS, RD, LDN Inpatient Clinical Dietitian Pager: [phone] Weekend/After Hours: [phone]

## 2019-09-07 NOTE — Progress Notes (Signed)
[facility] Progress Note   Subjective:   HD was bumped to today due to high volume HD unit yest-  Seen on HD - no c/o's -  Plan is for AKA tomorrow - wanting something to snack on but having some aspiration issues on dysphagia diet   Objective Vitals:   09/07/19 0800 09/07/19 0830 09/07/19 0900 09/07/19 0930  BP: (!) 157/103 98/75 125/73 103/64  Pulse: 71 67 78 76  Resp:      Temp:      TempSrc:      SpO2:      Weight:      Height:       Physical Exam General:chronically ill appearing, frail male in NAD Heart:RRR, +7/9 systolic murmur Lungs:CTAB anteriolaterally Abdomen:soft, NTND Extremities:L BKA dressed, R TMA, no edema Dialysis Access: L thigh AVG +b   Filed Weights   09/06/19 0443 09/07/19 0401 09/07/19 0657  Weight: 64.5 kg 63.4 kg 57.8 kg    Intake/Output Summary (Last 24 hours) at 09/07/2019 1000 Last data filed at 09/06/2019 2126 Gross per 24 hour  Intake 360 ml  Output 1 ml  Net 359 ml    Additional Objective Labs: Basic Metabolic Panel: Recent Labs  Lab 09/05/19 0446 09/06/19 0328 09/07/19 0110  NA 140 140 140  K 3.2* 3.4* 3.8  CL 104 103 103  CO2 25 26 24   GLUCOSE 87 88 89  BUN 24* 31* 37*  CREATININE 4.87* 6.15* 7.05*  CALCIUM 8.7* 8.4* 8.2*  PHOS 3.9  --   --    Liver Function Tests: Recent Labs  Lab 09/03/19 1358  AST 16  ALT 11  ALKPHOS 65  BILITOT 1.0  PROT 6.4*  ALBUMIN 1.7*   CBC: Recent Labs  Lab 09/03/19 2107 09/03/19 2107 09/04/19 0615 09/04/19 1948 09/05/19 0446 09/06/19 0328 09/07/19 0110  WBC 16.1*   < > 16.4*  --  15.5* 18.0* 19.5*  NEUTROABS  --   --   --   --  12.2* 13.5* 14.9*  HGB 8.1*   < > 7.3*   < > 8.3* 8.6* 8.4*  HCT 25.9*   < > 23.5*   < > 26.5* 26.4* 26.5*  MCV 63.3*  --  63.7*  --  66.4* 66.0* 65.6*  PLT 328   < > 293  --  295 288 281   < > = values in this interval not displayed.   Iron Studies:  No results for input(s): IRON, TIBC, TRANSFERRIN, FERRITIN in the last 72  hours. Lab Results  Component Value Date   INR 1.1 12/01/2018   INR 1.03 01/01/2018   INR 1.03 11/27/2017   Studies/Results: No results found.  Medications: . sodium chloride    . sodium chloride    . sodium chloride Stopped (09/04/19 1236)  . ferric gluconate (FERRLECIT/NULECIT) IV 125 mg (09/06/19 2126)   . amiodarone  200 mg Oral BID  . vitamin C  500 mg Oral Q24H  . atorvastatin  40 mg Oral q1800  . B-complex with vitamin C  1 tablet Oral Daily  . Chlorhexidine Gluconate Cloth  6 each Topical Q0600  . cholecalciferol  1,000 Units Oral Daily  . cinacalcet  60 mg Oral QHS  . darbepoetin (ARANESP) injection - DIALYSIS  100 mcg Intravenous Q Tue-HD  . melatonin  3 mg Oral QHS  . midodrine  10 mg Oral TID WC  . pantoprazole  40 mg Oral BID  . sodium chloride flush  10-40 mL  Intracatheter Q12H  . sodium chloride flush  3 mL Intravenous Q12H  . sodium chloride flush  3 mL Intravenous Q12H    Dialysis Orders: [facility] - MWF- 4 hours-  Thigh AVG-  BFR 400/DFR800-  2 K bath as OP-  EDW 62.  Gets heparin per tx-  2000 units per hour, epo 4000 and sensipar 60 mg.  Last hgb there was 4/26  9.9   Assessment/Plan: 1. GIB - EGD w/duodenal bulb ulcer. Gastric bx results pending. PPI BID started, to monitor close d/t prolonged QT. Unable to complete colonoscopy. Hgb fairly stable s/p 1unit pRBC upon admission. 2. L BKA wound dehiscence-scheduled for AKA 5/7, cancelled d/t GIB. Per cards mod-high surgical risk. per ortho, now planning for 5/12 3. Ventricular ectopy - asymptomatic. Cards starting amiodarone PO.  4. ESRD- On HD MWF. 4 K bath.  Off schedule-  Pt would like to get back on schedule-  Will plan for HD late tomorrow after OR 5. Hypotension/volume- Blood pressure soft. Does not appear volume overloaded. On midodrine TID.   Net UF less than 1.5 liters 6. Anemiaof CKD- Hgb stable at 8.3 s/p 1unit pRBC. Tsat 11%, iron ordered.  Also on ESA 7. Secondary  Hyperparathyroidism -Ca in goal. Phos 3.9 no binder.  Continue sensipar- does not get vit D with HD-  8. Nutrition- Renal diet w/fluid restrictions  [facility] Pager: [phone] 09/07/2019,10:00 AM  LOS: 4 days

## 2019-09-07 NOTE — Procedures (Signed)
Patient was seen on dialysis and the procedure was supervised.  BFR 400  Via AVG BP is  125/55.   Patient appears to be tolerating treatment well  Louis Meckel 09/07/2019

## 2019-09-08 ENCOUNTER — Inpatient Hospital Stay (HOSPITAL_COMMUNITY): Payer: Medicare Other | Admitting: Anesthesiology

## 2019-09-08 ENCOUNTER — Encounter: Payer: Self-pay | Admitting: Gastroenterology

## 2019-09-08 ENCOUNTER — Encounter (HOSPITAL_COMMUNITY): Payer: Self-pay | Admitting: Internal Medicine

## 2019-09-08 ENCOUNTER — Encounter (HOSPITAL_COMMUNITY)
Admission: RE | Disposition: A | Discharge status: Skilled Nursing Facility | Payer: Self-pay | Source: Home / Self Care | Attending: Internal Medicine

## 2019-09-08 DIAGNOSIS — I471 Supraventricular tachycardia, unspecified: Secondary | ICD-10-CM

## 2019-09-08 DIAGNOSIS — I739 Peripheral vascular disease, unspecified: Secondary | ICD-10-CM

## 2019-09-08 DIAGNOSIS — I9589 Other hypotension: Secondary | ICD-10-CM

## 2019-09-08 DIAGNOSIS — I10 Essential (primary) hypertension: Secondary | ICD-10-CM

## 2019-09-08 DIAGNOSIS — I493 Ventricular premature depolarization: Secondary | ICD-10-CM

## 2019-09-08 DIAGNOSIS — T8781 Dehiscence of amputation stump: Secondary | ICD-10-CM

## 2019-09-08 HISTORY — PX: AMPUTATION: SHX166

## 2019-09-08 LAB — TYPE AND SCREEN
ABO/RH(D): A POS
Antibody Screen: NEGATIVE

## 2019-09-08 LAB — SURGICAL PATHOLOGY

## 2019-09-08 LAB — GLUCOSE, CAPILLARY: Glucose-Capillary: 105 mg/dL — ABNORMAL HIGH (ref 70–99)

## 2019-09-08 SURGERY — AMPUTATION, ABOVE KNEE
Anesthesia: General | Site: Knee | Laterality: Left

## 2019-09-08 MED ORDER — FENTANYL CITRATE (PF) 100 MCG/2ML IJ SOLN
25.0000 ug | INTRAMUSCULAR | Status: DC | PRN
  Administered 2019-09-08: 25 ug via INTRAVENOUS

## 2019-09-08 MED ORDER — METOPROLOL TARTRATE 5 MG/5ML IV SOLN
INTRAVENOUS | Status: AC
  Administered 2019-09-08: 11:00:00 3 mg
  Filled 2019-09-08: qty 5

## 2019-09-08 MED ORDER — ONDANSETRON HCL 4 MG PO TABS: 4.0000 mg | ORAL_TABLET | Freq: Four times a day (QID) | ORAL | Status: DC | PRN

## 2019-09-08 MED ORDER — DOCUSATE SODIUM 100 MG PO CAPS
100.0000 mg | ORAL_CAPSULE | Freq: Two times a day (BID) | ORAL | Status: DC
  Administered 2019-09-08 – 2019-09-23 (×14): 100 mg via ORAL
  Filled 2019-09-08 (×25): qty 1

## 2019-09-08 MED ORDER — CEFAZOLIN SODIUM-DEXTROSE 2-3 GM-%(50ML) IV SOLR
INTRAVENOUS | Status: DC | PRN
  Administered 2019-09-08: 2 g via INTRAVENOUS

## 2019-09-08 MED ORDER — PHENYLEPHRINE 40 MCG/ML (10ML) SYRINGE FOR IV PUSH (FOR BLOOD PRESSURE SUPPORT)
PREFILLED_SYRINGE | INTRAVENOUS | Status: DC | PRN
  Administered 2019-09-08 (×2): 80 ug via INTRAVENOUS

## 2019-09-08 MED ORDER — PROPOFOL 10 MG/ML IV BOLUS
INTRAVENOUS | Status: AC
  Filled 2019-09-08: qty 20

## 2019-09-08 MED ORDER — METOPROLOL TARTRATE 5 MG/5ML IV SOLN
3.0000 mg | Freq: Once | INTRAVENOUS | Status: AC
  Administered 2019-09-08: 3 mg via INTRAVENOUS

## 2019-09-08 MED ORDER — ONDANSETRON HCL 4 MG/2ML IJ SOLN
INTRAMUSCULAR | Status: DC | PRN
  Administered 2019-09-08: 4 mg via INTRAVENOUS

## 2019-09-08 MED ORDER — VANCOMYCIN VARIABLE DOSE PER UNSTABLE RENAL FUNCTION (PHARMACIST DOSING): Status: DC

## 2019-09-08 MED ORDER — VANCOMYCIN HCL IN DEXTROSE 1-5 GM/200ML-% IV SOLN
1000.0000 mg | Freq: Once | INTRAVENOUS | Status: DC
  Filled 2019-09-08: qty 200

## 2019-09-08 MED ORDER — OXYCODONE HCL 5 MG PO TABS: 5.0000 mg | ORAL_TABLET | Freq: Once | ORAL | Status: DC | PRN

## 2019-09-08 MED ORDER — PHENYLEPHRINE HCL-NACL 10-0.9 MG/250ML-% IV SOLN
INTRAVENOUS | Status: DC | PRN
Start: 2019-09-08 — End: 2019-09-08
  Administered 2019-09-08: 40 ug/min via INTRAVENOUS

## 2019-09-08 MED ORDER — FENTANYL CITRATE (PF) 100 MCG/2ML IJ SOLN
INTRAMUSCULAR | Status: AC
  Filled 2019-09-08: qty 2

## 2019-09-08 MED ORDER — FENTANYL CITRATE (PF) 100 MCG/2ML IJ SOLN: 25.0000 ug | INTRAMUSCULAR | Status: DC | PRN

## 2019-09-08 MED ORDER — LIDOCAINE 2% (20 MG/ML) 5 ML SYRINGE
INTRAMUSCULAR | Status: AC
  Filled 2019-09-08: qty 5

## 2019-09-08 MED ORDER — ONDANSETRON HCL 4 MG/2ML IJ SOLN: 4.0000 mg | Freq: Four times a day (QID) | INTRAMUSCULAR | Status: DC | PRN

## 2019-09-08 MED ORDER — OXYCODONE HCL 5 MG/5ML PO SOLN: 5.0000 mg | Freq: Once | ORAL | Status: DC | PRN

## 2019-09-08 MED ORDER — SODIUM CHLORIDE 0.9 % IV SOLN: INTRAVENOUS | Status: DC

## 2019-09-08 MED ORDER — ONDANSETRON HCL 4 MG/2ML IJ SOLN
INTRAMUSCULAR | Status: AC
  Filled 2019-09-08: qty 2

## 2019-09-08 MED ORDER — METOCLOPRAMIDE HCL 5 MG/ML IJ SOLN: 5.0000 mg | Freq: Three times a day (TID) | INTRAMUSCULAR | Status: DC | PRN

## 2019-09-08 MED ORDER — PROPOFOL 10 MG/ML IV BOLUS
INTRAVENOUS | Status: DC | PRN
  Administered 2019-09-08 (×4): 30 mg via INTRAVENOUS

## 2019-09-08 MED ORDER — METOCLOPRAMIDE HCL 10 MG PO TABS: 5.0000 mg | ORAL_TABLET | Freq: Three times a day (TID) | ORAL | Status: DC | PRN

## 2019-09-08 MED ORDER — LINEZOLID 600 MG/300ML IV SOLN
600.0000 mg | Freq: Two times a day (BID) | INTRAVENOUS | Status: DC
  Administered 2019-09-08 – 2019-09-09 (×3): 600 mg via INTRAVENOUS
  Filled 2019-09-08 (×4): qty 300

## 2019-09-08 MED ORDER — ALBUMIN HUMAN 5 % IV SOLN
INTRAVENOUS | Status: DC | PRN
Start: 2019-09-08 — End: 2019-09-08

## 2019-09-08 MED ORDER — HYDROMORPHONE HCL 1 MG/ML IJ SOLN
0.5000 mg | INTRAMUSCULAR | Status: DC | PRN
  Administered 2019-09-10: 0.5 mg via INTRAVENOUS
  Filled 2019-09-08 (×2): qty 0.5

## 2019-09-08 MED ORDER — FENTANYL CITRATE (PF) 250 MCG/5ML IJ SOLN
INTRAMUSCULAR | Status: AC
  Filled 2019-09-08: qty 5

## 2019-09-08 MED ORDER — FENTANYL CITRATE (PF) 100 MCG/2ML IJ SOLN
INTRAMUSCULAR | Status: DC | PRN
  Administered 2019-09-08 (×3): 25 ug via INTRAVENOUS

## 2019-09-08 MED ORDER — OXYCODONE HCL 5 MG PO TABS
5.0000 mg | ORAL_TABLET | ORAL | Status: DC | PRN
  Administered 2019-09-08 – 2019-09-15 (×8): 10 mg via ORAL
  Administered 2019-09-15 (×2): 5 mg via ORAL
  Administered 2019-09-17 – 2019-09-21 (×3): 10 mg via ORAL
  Administered 2019-09-21 – 2019-09-23 (×2): 5 mg via ORAL
  Filled 2019-09-08: qty 1
  Filled 2019-09-08 (×6): qty 2
  Filled 2019-09-08: qty 1
  Filled 2019-09-08 (×2): qty 2
  Filled 2019-09-08: qty 1
  Filled 2019-09-08: qty 2
  Filled 2019-09-08: qty 1
  Filled 2019-09-08 (×4): qty 2

## 2019-09-08 MED ORDER — CEFAZOLIN SODIUM-DEXTROSE 1-4 GM/50ML-% IV SOLN
1.0000 g | INTRAVENOUS | Status: AC
  Administered 2019-09-08: 1 g via INTRAVENOUS
  Filled 2019-09-08: qty 50

## 2019-09-08 SURGICAL SUPPLY — 44 items
BLADE SAW RECIP 87.9 MT (BLADE) ×3 IMPLANT
BLADE SURG 21 STRL SS (BLADE) ×3 IMPLANT
BNDG COHESIVE 6X5 TAN STRL LF (GAUZE/BANDAGES/DRESSINGS) ×3 IMPLANT
CANISTER WOUND CARE 500ML ATS (WOUND CARE) IMPLANT
CANISTER WOUNDNEG PRESSURE 500 (CANNISTER) ×2 IMPLANT
COVER SURGICAL LIGHT HANDLE (MISCELLANEOUS) ×3 IMPLANT
COVER WAND RF STERILE (DRAPES) IMPLANT
CUFF TOURN SGL QUICK 34 (TOURNIQUET CUFF)
CUFF TRNQT CYL 34X4.125X (TOURNIQUET CUFF) IMPLANT
DRAPE INCISE IOBAN 66X45 STRL (DRAPES) ×6 IMPLANT
DRAPE U-SHAPE 47X51 STRL (DRAPES) ×3 IMPLANT
DRESSING PREVENA PLUS CUSTOM (GAUZE/BANDAGES/DRESSINGS) ×1 IMPLANT
DRSG PREVENA PLUS CUSTOM (GAUZE/BANDAGES/DRESSINGS) ×3
DURAPREP 26ML APPLICATOR (WOUND CARE) ×3 IMPLANT
ELECT REM PT RETURN 9FT ADLT (ELECTROSURGICAL) ×3
ELECTRODE REM PT RTRN 9FT ADLT (ELECTROSURGICAL) ×1 IMPLANT
GLOVE BIOGEL PI IND STRL 7.5 (GLOVE) ×1 IMPLANT
GLOVE BIOGEL PI IND STRL 9 (GLOVE) ×1 IMPLANT
GLOVE BIOGEL PI INDICATOR 7.5 (GLOVE) ×2
GLOVE BIOGEL PI INDICATOR 9 (GLOVE) ×2
GLOVE SURG ORTHO 9.0 STRL STRW (GLOVE) ×3 IMPLANT
GLOVE SURG SS PI 6.5 STRL IVOR (GLOVE) ×3 IMPLANT
GOWN STRL REUS W/ TWL LRG LVL3 (GOWN DISPOSABLE) ×1 IMPLANT
GOWN STRL REUS W/ TWL XL LVL3 (GOWN DISPOSABLE) ×2 IMPLANT
GOWN STRL REUS W/TWL LRG LVL3 (GOWN DISPOSABLE) ×3
GOWN STRL REUS W/TWL XL LVL3 (GOWN DISPOSABLE) ×6
KIT BASIN OR (CUSTOM PROCEDURE TRAY) ×3 IMPLANT
KIT TURNOVER KIT B (KITS) ×3 IMPLANT
MANIFOLD NEPTUNE II (INSTRUMENTS) ×3 IMPLANT
NS IRRIG 1000ML POUR BTL (IV SOLUTION) ×3 IMPLANT
PACK ORTHO EXTREMITY (CUSTOM PROCEDURE TRAY) ×3 IMPLANT
PAD ARMBOARD 7.5X6 YLW CONV (MISCELLANEOUS) ×3 IMPLANT
PREVENA RESTOR ARTHOFORM 33X30 (CANNISTER) ×2 IMPLANT
STAPLER VISISTAT 35W (STAPLE) IMPLANT
STOCKINETTE IMPERVIOUS LG (DRAPES) IMPLANT
SUT ETHILON 2 0 PSLX (SUTURE) ×10 IMPLANT
SUT SILK 2 0 (SUTURE) ×3
SUT SILK 2-0 18XBRD TIE 12 (SUTURE) ×1 IMPLANT
TOWEL GREEN STERILE FF (TOWEL DISPOSABLE) ×3 IMPLANT
TUBE CONNECTING 12'X1/4 (SUCTIONS) ×1
TUBE CONNECTING 12X1/4 (SUCTIONS) ×1 IMPLANT
TUBE CONNECTING 20'X1/4 (TUBING) ×1
TUBE CONNECTING 20X1/4 (TUBING) ×2 IMPLANT
YANKAUER SUCT BULB TIP NO VENT (SUCTIONS) ×3 IMPLANT

## 2019-09-08 NOTE — Anesthesia Procedure Notes (Signed)
Procedure Name: LMA Insertion Date/Time: 09/08/2019 10:30 AM Performed by: Joaopedro Eschbach T, CRNA Pre-anesthesia Checklist: Patient identified, Emergency Drugs available, Suction available and Patient being monitored Patient Re-evaluated:Patient Re-evaluated prior to induction Oxygen Delivery Method: Circle system utilized Preoxygenation: Pre-oxygenation with 100% oxygen Induction Type: IV induction Ventilation: Mask ventilation without difficulty LMA: LMA inserted LMA Size: 4.0 Number of attempts: 1 Placement Confirmation: positive ETCO2 and breath sounds checked- equal and bilateral Tube secured with: Tape Dental Injury: Teeth and Oropharynx as per pre-operative assessment

## 2019-09-08 NOTE — Progress Notes (Signed)
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  STable post Op  HD yesterday: 2L UF  Wound vac wrapping is overlying his AVG -- has +B/T  Objective Vitals:   09/08/19 1239 09/08/19 1244 09/08/19 1245 09/08/19 1300  BP:  (!) 102/58  (!) 98/52  Pulse:  92 80 83  Resp: 20 (!) 26    Temp:  98.2 F (36.8 C)  98.1 F (36.7 C)  TempSrc:    Oral  SpO2:  100% 100% 100%  Weight:      Height:       Physical Exam General:chronically ill appearing, frail male in NAD Heart:RRR, +9/4 systolic murmur Lungs:CTAB anteriolaterally Abdomen:soft, NTND Extremities:L AKA with wound vac Dialysis Access: L thigh AVG +b   Filed Weights   09/07/19 0657 09/07/19 1033 09/08/19 0327  Weight: 57.8 kg 55.7 kg 53.7 kg    Intake/Output Summary (Last 24 hours) at 09/08/2019 1411 Last data filed at 09/08/2019 1245 Gross per 24 hour  Intake 720 ml  Output 100 ml  Net 620 ml    Additional Objective Labs: Basic Metabolic Panel: Recent Labs  Lab 09/05/19 0446 09/06/19 0328 09/07/19 0110  NA 140 140 140  K 3.2* 3.4* 3.8  CL 104 103 103  CO2 25 26 24   GLUCOSE 87 88 89  BUN 24* 31* 37*  CREATININE 4.87* 6.15* 7.05*  CALCIUM 8.7* 8.4* 8.2*  PHOS 3.9  --   --    Liver Function Tests: Recent Labs  Lab 09/03/19 1358  AST 16  ALT 11  ALKPHOS 65  BILITOT 1.0  PROT 6.4*  ALBUMIN 1.7*   CBC: Recent Labs  Lab 09/03/19 2107 09/03/19 2107 09/04/19 0615 09/04/19 1948 09/05/19 0446 09/06/19 0328 09/07/19 0110  WBC 16.1*   < > 16.4*  --  15.5* 18.0* 19.5*  NEUTROABS  --   --   --   --  12.2* 13.5* 14.9*  HGB 8.1*   < > 7.3*   < > 8.3* 8.6* 8.4*  HCT 25.9*   < > 23.5*   < > 26.5* 26.4* 26.5*  MCV 63.3*  --  63.7*  --  66.4* 66.0* 65.6*  PLT 328   < > 293  --  295 288 281   < > = values in this interval not displayed.   Iron Studies:  No results for input(s): IRON, TIBC, TRANSFERRIN, FERRITIN in the last 72 hours. Lab Results   Component Value Date   INR 1.1 12/01/2018   INR 1.03 01/01/2018   INR 1.03 11/27/2017   Studies/Results: No results found.  Medications: . sodium chloride    . sodium chloride    . sodium chloride Stopped (09/04/19 1236)  . sodium chloride    .  ceFAZolin (ANCEF) IV    . ferric gluconate (FERRLECIT/NULECIT) IV 125 mg (09/06/19 2126)   . amiodarone  200 mg Oral BID  . vitamin C  500 mg Oral Q24H  . atorvastatin  40 mg Oral q1800  . B-complex with vitamin C  1 tablet Oral Daily  . Chlorhexidine Gluconate Cloth  6 each Topical Q0600  . Chlorhexidine Gluconate Cloth  6 each Topical Q0600  . cholecalciferol  1,000 Units Oral Daily  . cinacalcet  60 mg Oral QHS  . darbepoetin (ARANESP) injection - DIALYSIS  100 mcg Intravenous Q Tue-HD  . docusate sodium  100 mg Oral BID  . feeding supplement (  NEPRO CARB STEADY)  237 mL Oral TID BM  . feeding supplement (PRO-STAT SUGAR FREE 64)  30 mL Oral BID  . fentaNYL      . melatonin  3 mg Oral QHS  . midodrine  10 mg Oral TID WC  . pantoprazole  40 mg Oral BID  . sodium chloride flush  10-40 mL Intracatheter Q12H  . sodium chloride flush  3 mL Intravenous Q12H  . sodium chloride flush  3 mL Intravenous Q12H    Dialysis Orders: [facility] - MWF- 4 hours-  Thigh AVG-  BFR 400/DFR800-  2 K bath as OP-  EDW 62.  Gets heparin per tx-  2000 units per hour, epo 4000 and sensipar 60 mg.  Last hgb there was 4/26  9.9   Assessment/Plan: 1. GIB - EGD w/duodenal bulb ulcer. Gastric bx results pending. PPI BID started, to monitor close d/t prolonged QT. Unable to complete colonoscopy. Hgb fairly stable s/p 1unit pRBC upon admission. 2. 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   3. Ventricular ectopy - asymptomatic. Cards starting amiodarone PO.  4. ESRD- On HD MWF. 4 K bath.  Off schedule last Tx 5/11.  Now has wound vac overlying his AVG, this might presen a problem with cannuation.  WIll reassess tomorrow, see day by day HD  needs and status of bandaging.   5. Hypotension/volume- Blood pressure soft. Does not appear volume overloaded. On midodrine TID.    6. Anemiaof CKD- Hgb stable in 8s  s/p 1unit pRBC. Tsat 11%, iron ordered.  Also on ESA 7. Secondary Hyperparathyroidism -Ca in goal. Phos 3.9 no binder.  Continue sensipar- does not get vit D with HD-  8. Nutrition- Renal diet w/fluid restrictions  [facility] 09/08/2019,2:11 PM  LOS: 5 days

## 2019-09-08 NOTE — Progress Notes (Signed)
Called report to [facility] in short stay prior to him going to surgery for a Left BKA, received a call from [place] reporting patient had a few runs of PVC's. Transported him down to short stay on telemetry monitoring. No distress observed. Safe transfer of care achieved.

## 2019-09-08 NOTE — Progress Notes (Signed)
OT Cancellation Note  Patient Details Name: Joseph KEIRSEY Sr. MRN: [id number] DOB: 30-Oct-1940   Cancelled Treatment:    Reason Eval/Treat Not Completed: Patient at procedure or test/ unavailable. Patient off floor at procedure. Will follow up tomorrow.  August Luz, OTR/L   Phylliss Bob 09/08/2019, 3:36 PM

## 2019-09-08 NOTE — Progress Notes (Signed)
PROGRESS NOTE    [name]  XBM:[id number] DOB: 10-May-1940 DOA: 09/03/2019 PCP: [name], MD    No chief complaint on file.   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 However in the preoperative area, patient noted to have rectal bleeding for which [facility] was consulted and advised that the procedure will need to be postponed.  Patient was subsequently admitted.    Assessment & Plan:   Principal Problem:   Acute GI bleeding Active Problems:   Encounter for central line placement   ESRD on dialysis [facility])   Aortic stenosis   PVD (peripheral vascular disease) ([place])   Essential hypertension   Duodenal ulcer   Palliative care encounter   DNR (do not resuscitate) discussion   Goals of care, counseling/discussion   Dehiscence of amputation stump ([place])   Chronic hypotension   SVT (supraventricular tachycardia) ([facility])   Ventricular ectopy  1 upper GI bleed secondary to duodenal ulcer/acute blood loss anemia Patient noted to have some rectal bleeding in the preop area prior to surgery and subsequently admitted.  Patient transfused a unit of packed  red blood cells 09/04/2019.  Plavix currently on hold.  Status post upper endoscopy 09/04/2019 that showed duodenal ulcer which was likely cause of patient's bleeding per GI.  Colonoscopy subsequently was not performed as patient also had a poor prep.  Continue PPI every 12 hours.  Continue to hold Plavix for now and orthopedics to recommend when Plavix may be resumed.  Hemoglobin currently at 8.4 09/07/2019.  Repeat labs.  Follow.  2.  End-stage renal disease on hemodialysis Per nephrology.  3.  Progressive gangrenous necrotic changes with stump dehiscence of left transtibial amputation site Patient seen by orthopedics.  Patient subsequently underwent left AKA today(09/08/2019) without any complications.  Currently on Prevena boot.  Per orthopedics.  4.  Leukocytosis Likely secondary to problem #3.  Status post left AKA.  Will place on IV vancomycin for the next 24 to 48 hours.  5.  Chronic diastolic heart failure Compensated.  On hemodialysis.  Per nephrology.  6.  Chronic hypotension Continue home regimen midodrine  7. SVT Patient noted to have a run of SVT with heart rates as high as the 200s in the PACU postoperatively.  Patient received a dose of IV metoprolol and SVT broken and heart rate in the 70s.  Patient started on amiodarone per cardiology for ventricular ectopy.  Check a magnesium, potassium levels in the morning and replete as needed.  Follow.  8.  Ventricular ectopy Patient started on amiodarone per cardiology.  Follow.  9.  Severe Aortic stenosis Noted on TEE during previous admission.  Try to avoid hypotension.  Not a candidate for TAVR.  Cardiology following.  10.  Prolonged QTC Repeat EKG pending.  Keep magnesium greater than 2.  Keep potassium greater than 4.  11.  Iron deficiency anemia/anemia of chronic disease H&H stable.  Follow.  12.  History of CVA Plavix currently on hold.  Orthopedics to advise when Plavix may be resumed.  13.  Generalized deconditioning  Patient overall with a poor prognosis.  Palliative care has assessed and followed patient.  Patient now with partial code.  Patient wishes to have CPR and ACLS medication but no intubation or shock.  PT/OT.  Will likely need SNF.   DVT prophylaxis: Recent GI bleed.  Avoiding chemical prophylaxis.  SCDs. Code Status: Partial Family Communication: Updated patient. Disposition:   Status is: Inpatient    Dispo: The patient is from: SNF              Anticipated d/c is to: SNF              Anticipated d/c date is: Hopefully in 2 to 3 days.              Patient currently not medically stable for discharge.  Patient underwent left AKA today in the OR.        Consultants:   Gastroenterology: Dr. Tarri Glenn 09/03/2019  Nephrology: Dr.Schertz 09/03/2019  Palliative care: Lindell Spar, NP 09/04/2019  Cardiology: Dr. Lovena Le 09/04/2019  Domenic Moras, FNP 09/07/2019  Orthopedics: Dr. Sharol Given  Procedures:   Chest x-ray 09/03/2019  Upper endoscopy 09/04/2019--per Dr. Tarri Glenn  Left above-the-knee amputation per Dr. Sharol Given 09/08/2019 EGD on 09/04/2019 Impression: - Duodenal ulcer may be the cause of recent overt  GI bleeding. 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 Biopsied. - Erosive gastropathy with no bleeding and no  stigmata of recent bleeding. Biopsied. - A single non-bleeding angioectasia in the  duodenum. - Small hiatal hernia. - Unable to proceed with colonoscopy as planned due  to incomplete preparation. Patient had formed stool  on last bowel movement. Recommendation: - Return patient to hospital ward for ongoing care. - Clear liquid diet. Advance as tolerated.  - No aspirin, ibuprofen, naproxen, or other  non-steroidal anti-inflammatory drugs. - Patient has a prolonged QT. Discussed with Dr.  Starla Link who agreed with trial of pantoprazole 40 mg  IV BID with close monitoring of QTc. - Will defer colonoscopy at this time given the  challenges that the patient has prepping for the  procedure. Duodenal ulcer may be the source of  bleeding. - Continue serial hgb/hct with transfusion as  indicated. Antimicrobials:   IV Ancef 09/08/2019 preoperatively  IV vancomycin   Subjective: Patient noted to have a bout of SVT postop in the PACU with heart rate as high as 200 and subsequently given IV metoprolol which broke SVT patient currently in heart rates of 70s.  Patient denies any chest pain.  No shortness of breath.  No abdominal pain.  Complaining of left lower extremity pain.  Objective: Vitals:   09/08/19 1600 09/08/19 1700 09/08/19 1800 09/08/19 1845  BP: (!) 110/51 (!) 120/52 (!) 121/53   Pulse: 85 85 80 82  Resp: (!) 25 (!) 28 (!) 32 (!) 22  Temp: 98 F (36.7 C)     TempSrc: Oral     SpO2: 100% 100% 99% 100%  Weight:      Height:        Intake/Output Summary (Last 24 hours) at 09/08/2019 2051 Last data filed at 09/08/2019 1900 Gross per 24 hour  Intake 840 ml  Output 100 ml  Net 740 ml   Filed Weights   09/07/19 0657 09/07/19 1033 09/08/19 0327  Weight: 57.8 kg 55.7 kg 53.7 kg    Examination:  General exam: Appears calm and  comfortable  Respiratory system: Clear to auscultation anterior lung fields.Marland Kitchen Respiratory effort normal. Cardiovascular system: S1 & S2 heard, RRR. No JVD, murmurs, rubs, gallops or clicks. No pedal edema. Gastrointestinal system:  Abdomen is nondistended, soft and nontender. No organomegaly or masses felt. Normal bowel sounds heard. Central nervous system: Alert and oriented. No focal neurological deficits. Extremities: Status post left BKA with wound VAC in place.  Status post right transmetatarsal amputation. Skin: No rashes, lesions or ulcers Psychiatry: Judgement and insight appear normal. Mood & affect appropriate.     Data Reviewed: I have personally reviewed following labs and imaging studies  CBC: Recent Labs  Lab 09/03/19 2107 09/03/19 2107 09/04/19 0615 09/04/19 1948 09/05/19 0446 09/06/19 0328 09/07/19 0110  WBC 16.1*  --  16.4*  --  15.5* 18.0* 19.5*  NEUTROABS  --   --   --   --  12.2* 13.5* 14.9*  HGB 8.1*   < > 7.3* 8.5* 8.3* 8.6* 8.4*  HCT 25.9*   < > 23.5* 25.8* 26.5* 26.4* 26.5*  MCV 63.3*  --  63.7*  --  66.4* 66.0* 65.6*  PLT 328  --  293  --  295 288 281   < > = values in this interval not displayed.    Basic Metabolic Panel: Recent Labs  Lab 09/03/19 1358 09/05/19 0446 09/06/19 0328 09/07/19 0110  NA 136 140 140 140  K 4.2 3.2* 3.4* 3.8  CL 98 104 103 103  CO2 26 25 26 24   GLUCOSE 78 87 88 89  BUN 39* 24* 31* 37*  CREATININE 5.70* 4.87* 6.15* 7.05*  CALCIUM 9.2 8.7* 8.4* 8.2*  MG 2.2  --  2.1  --   PHOS  --  3.9  --   --     GFR: Estimated Creatinine Clearance: 6.8 mL/min (A) (by C-G formula based on SCr of 7.05 mg/dL (H)).  Liver Function Tests: Recent Labs  Lab 09/03/19 1358  AST 16  ALT 11  ALKPHOS 65  BILITOT 1.0  PROT 6.4*  ALBUMIN 1.7*    CBG: Recent Labs  Lab 09/08/19 1117  GLUCAP 105*     Recent Results (from the past 240 hour(s))  Respiratory Panel by RT PCR (Flu A&B, Covid) - Nasopharyngeal Swab     Status: None   Collection Time: 09/03/19  8:18 AM   Specimen: Nasopharyngeal Swab  Result Value Ref Range Status   SARS Coronavirus 2 by RT PCR NEGATIVE NEGATIVE Final    Comment: (NOTE) SARS-CoV-2 target nucleic acids are NOT DETECTED.  The SARS-CoV-2 RNA is generally detectable in upper respiratoy specimens during the acute phase of infection. The lowest concentration of SARS-CoV-2 viral copies this assay can detect is 131 copies/mL. A negative result does not preclude SARS-Cov-2 infection and should not be used as the sole basis for treatment or other patient management decisions. A negative result may occur with  improper specimen collection/handling, submission of specimen other than nasopharyngeal swab, presence of viral mutation(s) within the areas targeted by this assay, and inadequate number of viral copies (<131 copies/mL). A negative result must be combined with clinical observations, patient history, and epidemiological information. The expected result is Negative. Fact Sheet for Patients:  PinkCheek.be Fact Sheet for Healthcare Providers:  GravelBags.it This test is not yet ap proved or cleared by the [place] FDA and  has been authorized for detection and/or diagnosis of SARS-CoV-2 by FDA under an Emergency Use Authorization (EUA). This EUA will remain  in effect (meaning  this test can be used) for the duration of the COVID-19 declaration under Section 564(b)(1) of the Act, 21 U.S.C. section 360bbb-3(b)(1), unless the authorization is terminated or revoked sooner.    Influenza A by PCR NEGATIVE NEGATIVE Final   Influenza B by PCR NEGATIVE NEGATIVE Final    Comment: (NOTE) The Xpert Xpress SARS-CoV-2/FLU/RSV assay is intended as an aid in  the diagnosis of influenza from Nasopharyngeal swab specimens and  should not be used as a sole basis for treatment. Nasal washings and  aspirates are unacceptable for Xpert Xpress SARS-CoV-2/FLU/RSV  testing. Fact Sheet for Patients: PinkCheek.be Fact Sheet for Healthcare Providers: GravelBags.it This test is not yet approved or cleared by the  [place] FDA and  has been authorized for detection and/or diagnosis of SARS-CoV-2 by  FDA under an Emergency Use Authorization (EUA). This EUA will remain  in effect (meaning this test can be used) for the duration of the  Covid-19 declaration under Section 564(b)(1) of the Act, 21  U.S.C. section 360bbb-3(b)(1), unless the authorization is  terminated or revoked. Performed at [facility], [place] [address] [place], [place] [address]   MRSA PCR Screening     Status: None   Collection Time: 09/06/19  4:55 AM   Specimen: Nasopharyngeal  Result Value Ref Range Status   MRSA by PCR NEGATIVE NEGATIVE Final    Comment:        The GeneXpert MRSA Assay (FDA approved for NASAL specimens only), is one component of a comprehensive MRSA colonization surveillance program. It is not intended to diagnose MRSA infection nor to guide or monitor treatment for MRSA infections. Performed at [facility], [place] [address] [place], [place] [address]   Surgical pcr screen     Status: None   Collection Time: 09/07/19  7:45 PM   Specimen: Nasal Mucosa; Nasal Swab  Result Value Ref Range Status   MRSA, PCR NEGATIVE NEGATIVE Final   Staphylococcus aureus NEGATIVE NEGATIVE Final    Comment: (NOTE) The Xpert SA Assay (FDA approved for NASAL specimens in patients 62 years of age and older), is one component of a comprehensive surveillance program. It is not intended to diagnose infection nor to guide or monitor treatment. Performed at [facility], [place] [address] [place], [place] [address]          Radiology Studies: No results found.      Scheduled Meds: . amiodarone  200 mg Oral BID  . vitamin C  500 mg Oral Q24H  . atorvastatin  40 mg Oral q1800  . B-complex with vitamin C  1 tablet Oral Daily  . Chlorhexidine Gluconate Cloth  6 each Topical Q0600  . Chlorhexidine Gluconate Cloth  6 each Topical Q0600  . cholecalciferol  1,000 Units Oral Daily  . cinacalcet   60 mg Oral QHS  . darbepoetin (ARANESP) injection - DIALYSIS  100 mcg Intravenous Q Tue-HD  . docusate sodium  100 mg Oral BID  . feeding supplement (NEPRO CARB STEADY)  237 mL Oral TID BM  . feeding supplement (PRO-STAT SUGAR FREE 64)  30 mL Oral BID  . fentaNYL      . melatonin  3 mg Oral QHS  . midodrine  10 mg Oral TID WC  . pantoprazole  40 mg Oral BID  . sodium chloride flush  10-40 mL Intracatheter Q12H  . sodium chloride flush  3 mL Intravenous Q12H  . sodium chloride flush  3 mL Intravenous Q12H  Continuous Infusions: . sodium chloride    . sodium chloride    . sodium chloride Stopped (09/04/19 1236)  . sodium chloride 50 mL/hr at 09/08/19 1413  . ferric gluconate (FERRLECIT/NULECIT) IV 125 mg (09/06/19 2126)     LOS: 5 days    Time spent: 40 minutes    Irine Seal, MD [facility]   To contact the attending provider between 7A-7P or the covering provider during after hours 7P-7A, please log into the web site [email or web address] and access using universal [place] password for that web site. If you do not have the password, please call the hospital operator.  09/08/2019, 8:51 PM

## 2019-09-08 NOTE — Anesthesia Preprocedure Evaluation (Addendum)
Anesthesia Evaluation  Patient identified by MRN, date of birth, ID band Patient awake    Reviewed: Allergy & Precautions, NPO status , Patient's Chart, lab work & pertinent test results  History of Anesthesia Complications Negative for: history of anesthetic complications  Airway Mallampati: II   Neck ROM: Full    Dental  (+) Dental Advisory Given   Pulmonary neg pulmonary ROS, former smoker,    breath sounds clear to auscultation       Cardiovascular hypertension, + CAD, + Peripheral Vascular Disease and +CHF  + Valvular Problems/Murmurs AS  Rhythm:Regular Rate:Normal + Systolic murmurs  '21 TEE - EF 55 to 60%. Mild left ventricular hypertrophy.  LA was mildly dilated. Mild MR. Mild to mod TR. Moderate AI, severe AS. Aortic valve area, by VTI measures 0.69 cm. Aortic valve mean gradient measures 42.60mHg. Severe (Grade IV) atheroma plaque involving the transverse and descending aorta. Possible tiny PFO with scant color flow seen between atrial septal flap, with redundant atrial septum. Agitated saline contrast bubble study was negative, with no evidence of any interatrial shunt.    Neuro/Psych PSYCHIATRIC DISORDERS Anxiety CVA (left hemiparesis), Residual Symptoms    GI/Hepatic Neg liver ROS, PUD,   Endo/Other  negative endocrine ROS  Renal/GU ESRF and DialysisRenal disease     Musculoskeletal  (+) Arthritis ,   Abdominal   Peds  Hematology  (+) anemia ,  WBC 19.5k    Anesthesia Other Findings   Reproductive/Obstetrics                            Anesthesia Physical Anesthesia Plan  ASA: IV  Anesthesia Plan: General   Post-op Pain Management:    Induction: Intravenous  PONV Risk Score and Plan: 2 and Treatment may vary due to age or medical condition, Ondansetron and Dexamethasone  Airway Management Planned: LMA  Additional Equipment: None  Intra-op Plan:   Post-operative  Plan: Extubation in OR  Informed Consent: I have reviewed the patients History and Physical, chart, labs and discussed the procedure including the risks, benefits and alternatives for the proposed anesthesia with the patient or authorized representative who has indicated his/her understanding and acceptance.     Dental advisory given  Plan Discussed with: CRNA and Anesthesiologist  Anesthesia Plan Comments: (Contemplated arterial line for procedure, but patient with very weak pulses and other evidence of poor circulation to hands so will proceed without arterial line. Patient severely ill, discussed risks of anesthesia with patient up to and including death. Patient expressed understanding and wished to proceed.)      Anesthesia Quick Evaluation                                  Anesthesia Evaluation  Patient identified by MRN, date of birth, ID band Patient confused    Reviewed: Allergy & Precautions, NPO status , Patient's Chart, lab work & pertinent test results, Unable to perform ROS - Chart review only  History of Anesthesia Complications Negative for: history of anesthetic complications  Airway Mallampati: II  TM Distance: >3 FB Neck ROM: Full   Comment:  Unable to perform airway exam due to patient's mental status  Dental  (+) Dental Advisory Given, Edentulous Upper, Edentulous Lower   Pulmonary former smoker,    Pulmonary exam normal breath sounds clear to auscultation       Cardiovascular hypertension, + CAD and +  Peripheral Vascular Disease  + dysrhythmias Atrial Fibrillation + Valvular Problems/Murmurs AS  Rhythm:Irregular Rate:Abnormal + Systolic murmurs TEE 3/61/22: 1. Left ventricular ejection fraction, by estimation, is 55 to 60%. The  left ventricle has normal function. Left ventricular endocardial border  not optimally defined to evaluate regional wall motion. There is mild left  ventricular hypertrophy.  2. Right ventricular systolic  function is normal. The right ventricular  size is normal.  3. Left atrial size was mildly dilated. No left atrial/left atrial  appendage thrombus was detected. The LAA emptying velocity was 69 cm/s.  4. The mitral valve is degenerative. Mild mitral valve regurgitation.  5. The tricuspid valve is degenerative. Tricuspid valve regurgitation is  mild to moderate.  6. The aortic valve is severely calcified and restricted. Aortic valve  regurgitation is moderate. Severe aortic valve stenosis. Aortic valve  area, by VTI measures 0.69 cm. Aortic valve mean gradient measures 42.0  mmHg.  7. There is Severe (Grade IV) atheroma plaque involving the transverse  and descending aorta.  8. Possible tiny PFO with scant color flow seen between atrial septal  flap, with redundant atrial septum. Agitated saline contrast bubble study  was negative, with no evidence of any interatrial shunt.     Neuro/Psych PSYCHIATRIC DISORDERS Anxiety CVA, Residual Symptoms    GI/Hepatic PUD, GIB    Endo/Other    Renal/GU ESRF and DialysisRenal disease (MWF; K+ 4.2 on 4/7)     Musculoskeletal  (+) Arthritis ,   Abdominal   Peds  Hematology  (+) Blood dyscrasia (Plavix), anemia ,  Leukocytosis, WBC > 30k    Anesthesia Other Findings Covid neg 4/13  Reproductive/Obstetrics                            Anesthesia Physical  Anesthesia Plan  ASA: IV  Anesthesia Plan: MAC   Post-op Pain Management:    Induction: Intravenous  PONV Risk Score and Plan: 1 and Propofol infusion and Treatment may vary due to age or medical condition  Airway Management Planned: Nasal Cannula and Natural Airway  Additional Equipment: None  Intra-op Plan:   Post-operative Plan:   Informed Consent: I have reviewed the patients History and Physical, chart, labs and discussed the procedure including the risks, benefits and alternatives for the proposed anesthesia with the patient or  authorized representative who has indicated his/her understanding and acceptance.       Plan Discussed with: CRNA and Anesthesiologist  Anesthesia Plan Comments:         Anesthesia Quick Evaluation

## 2019-09-08 NOTE — Progress Notes (Addendum)
Physical Therapy Treatment Patient Details Name: Joseph WILLERT Sr. MRN: [id number] DOB: 1941-03-08 Today's Date: 09/08/2019    History of Present Illness Pt is a 79 y/o male now s/p L transfemoral amputation on 5/12. On admission, pt incidentally found to have rectal bleeding. PMH including but not limited to R LE transmetatarsal amp, HTN, arthritis, CVA with residual L sided weakness, arthritis, anxiety, ESRD M/W/F, recent fall with R hip avulasion fx WBAT with no medical mgmt.  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    PT Comments    Pt seen for re-evaluation following his L transfemoral amputation this morning. He continues to require heavy physical assistance of two for bed mobility. He refused transfer attempt this session. Will attempt at next session if appropriate and pt willing. All VSS throughout. Pt would continue to benefit from skilled physical therapy services at this time while admitted and after d/c to address the below listed limitations in order to improve overall safety and independence with functional mobility.    Follow Up Recommendations  SNF     Equipment Recommendations  None recommended by PT    Recommendations for Other Services       Precautions / Restrictions Precautions Precautions: Fall;Other (comment) Precaution Comments: R TMA, new L transfemoral amputation, wound VAC Restrictions Weight Bearing Restrictions: Yes RLE Weight Bearing: Weight bearing as tolerated LLE Weight Bearing: Non weight bearing    Mobility  Bed Mobility Overal bed mobility: Needs Assistance Bed Mobility: Supine to Sit;Sit to Supine     Supine to sit: Max assist;+2 for physical assistance;HOB elevated Sit to supine: Max assist;+2 for physical assistance   General bed mobility comments: multimodal cueing required, increased time and effort, pt able to use R UE on bed rail to assist; use of bed pads to position pt's hips at  EOB  Transfers                 General transfer comment: pt declining transfer this date  Ambulation/Gait                 Stairs             Wheelchair Mobility    Modified Rankin (Stroke Patients Only)       Balance Overall balance assessment: Needs assistance Sitting-balance support: Feet supported;Single extremity supported;Bilateral upper extremity supported Sitting balance-Leahy Scale: Poor                                      Cognition Arousal/Alertness: Awake/alert Behavior During Therapy: Flat affect Overall Cognitive Status: No family/caregiver present to determine baseline cognitive functioning Area of Impairment: Following commands;Safety/judgement;Problem solving                       Following Commands: Follows one step commands inconsistently Safety/Judgement: Decreased awareness of deficits   Problem Solving: Slow processing;Requires verbal cues;Requires tactile cues;Decreased initiation;Difficulty sequencing        Exercises      General Comments        Pertinent Vitals/Pain Pain Assessment: Faces Faces Pain Scale: Hurts little more Pain Location: L residual limb Pain Descriptors / Indicators: Guarding;Grimacing Pain Intervention(s): Monitored during session;Repositioned    Home Living                      Prior Function  PT Goals (current goals can now be found in the care plan section) Acute Rehab PT Goals PT Goal Formulation: Patient unable to participate in goal setting Time For Goal Achievement: 09/19/19 Potential to Achieve Goals: Fair Progress towards PT goals: Progressing toward goals    Frequency    Min 2X/week      PT Plan Current plan remains appropriate    Co-evaluation              AM-PAC PT "6 Clicks" Mobility   Outcome Measure  Help needed turning from your back to your side while in a flat bed without using bedrails?: A Lot Help needed  moving from lying on your back to sitting on the side of a flat bed without using bedrails?: A Lot Help needed moving to and from a bed to a chair (including a wheelchair)?: Total Help needed standing up from a chair using your arms (e.g., wheelchair or bedside chair)?: Total Help needed to walk in hospital room?: Total Help needed climbing 3-5 steps with a railing? : Total 6 Click Score: 8    End of Session   Activity Tolerance: Patient limited by fatigue Patient left: in bed;with call bell/phone within reach;with bed alarm set Nurse Communication: Mobility status PT Visit Diagnosis: Other abnormalities of gait and mobility (R26.89);Muscle weakness (generalized) (M62.81) Pain - Right/Left: Left Pain - part of body: Leg     Time: 2518-9842 PT Time Calculation (min) (ACUTE ONLY): 21 min  Charges:                        Anastasio Champion, DPT  Acute Rehabilitation Services Pager [phone] Office [place] 09/08/2019, 3:57 PM

## 2019-09-08 NOTE — Progress Notes (Signed)
PT Cancellation Note  Patient Details Name: [name] Sr. MRN: [id number] DOB: 10-Apr-1941   Cancelled Treatment:    Reason Eval/Treat Not Completed: Patient at procedure or test/unavailable (to OR for transfemoral amputation). PT will continue to f/u with pt acutely as available.    [place] 09/08/2019, 10:41 AM

## 2019-09-08 NOTE — Op Note (Signed)
09/08/2019  11:20 AM  PATIENT:  [place].    PRE-OPERATIVE DIAGNOSIS:  Gangrene Left Below Knee Amputation  POST-OPERATIVE DIAGNOSIS:  Same  PROCEDURE:  LEFT ABOVE KNEE AMPUTATION Application of Prevena customizable and arthroform wound VAC.  SURGEON:  [name], MD  PHYSICIAN ASSISTANT:None ANESTHESIA:   General  PREOPERATIVE INDICATIONS:  Joseph UPTAIN Sr. is a  79 y.o. male with a diagnosis of Gangrene Left Below Knee Amputation who failed conservative measures and elected for surgical management.    The risks benefits and alternatives were discussed with the patient preoperatively including but not limited to the risks of infection, bleeding, nerve injury, cardiopulmonary complications, the need for revision surgery, among others, and the patient was willing to proceed.  OPERATIVE IMPLANTS: Praveena customizable and arthroform wound VAC.  @ENCIMAGES @  OPERATIVE FINDINGS: Patient previously had dialysis access in the left thigh this has contributed to his decreased circulation to heal the transtibial amputation of the left with progressive gangrenous changes up to the popliteal fossa patient presents at this time for above-the-knee amputation.  OPERATIVE PROCEDURE: Patient was brought the operating room and underwent a general anesthetic.  After adequate levels anesthesia were obtained patient's left lower extremity was prepped using DuraPrep draped into a sterile field a timeout was called.  A fishmouth incision was made distal to the previous dialysis graft access site.  This was carried down to the muscle electrocautery was used to incise the muscle electrocautery was used for hemostasis.  The vascular bundles medially were clamped and ligated with 2-0 silk.  The amputation was completed posteriorly through the skin muscle was divided with electrocautery to minimize blood loss.  The femur was transected with an oscillating saw.  Hemostasis was obtained the deep and  superficial fascia layers were closed and the skin was closed using staples.  The customizable and Arnell Sieving form wound VAC was applied this had a good suction fit patient was extubated taken to PACU in stable condition.   DISCHARGE PLANNING:  Antibiotic duration: Continue antibiotics for 24 hours  Weightbearing: Nonweightbearing on the left  Pain medication: Opioid pathway  Dressing care/ Wound VAC: Wound VAC for 1 week  Ambulatory devices: Transfer training  Discharge to: Skilled nursing  Follow-up: In the office 1 week post operative.

## 2019-09-08 NOTE — Progress Notes (Signed)
Patient remains off the unit in the surgery. Oncoming receiving RN Chester Holstein was given report. He will be assuming the care of the patient once he returns to the unit after surgery. No updates provided by Short stay/OR prior to reporting to [facility].

## 2019-09-08 NOTE — Interval H&P Note (Signed)
History and Physical Interval Note:  09/08/2019 6:41 AM  Joseph [place] Sr.  has presented today for surgery, with the diagnosis of Gangrene Left Below Knee Amputation.  The various methods of treatment have been discussed with the patient and family. After consideration of risks, benefits and other options for treatment, the patient has consented to  Procedure(s): LEFT ABOVE KNEE AMPUTATION (Left) as a surgical intervention.  The patient's history has been reviewed, patient examined, no change in status, stable for surgery.  I have reviewed the patient's chart and labs.  Questions were answered to the patient's satisfaction.     [name]

## 2019-09-08 NOTE — Transfer of Care (Signed)
Immediate Anesthesia Transfer of Care Note  Patient: Joseph BLOWE Sr.  Procedure(s) Performed: LEFT ABOVE KNEE AMPUTATION (Left Knee)  Patient Location: PACU  Anesthesia Type:General  Level of Consciousness: awake, alert  and oriented  Airway & Oxygen Therapy: Patient Spontanous Breathing and Patient connected to nasal cannula oxygen  Post-op Assessment: Report given to RN and Post -op Vital signs reviewed and unstable, Anesthesiologist notified  Post vital signs: Reviewed and stable  Last Vitals:  Vitals Value Taken Time  BP 110/53 09/08/19 1130  Temp 37.1 C 09/08/19 1120  Pulse 77 09/08/19 1131  Resp 15 09/08/19 1131  SpO2 100 % 09/08/19 1131  Vitals shown include unvalidated device data.  Last Pain:  Vitals:   09/08/19 0736  TempSrc: Axillary  PainSc:       Patients Stated Pain Goal: 3 (79/03/83 3383)  Complications: No apparent anesthesia complications

## 2019-09-08 NOTE — Anesthesia Postprocedure Evaluation (Signed)
Anesthesia Post Note  Patient: [name].  Procedure(s) Performed: LEFT ABOVE KNEE AMPUTATION (Left Knee)     Patient location during evaluation: PACU Anesthesia Type: General Level of consciousness: awake and alert Pain management: pain level controlled Vital Signs Assessment: post-procedure vital signs reviewed and stable Respiratory status: spontaneous breathing, nonlabored ventilation and respiratory function stable Cardiovascular status: blood pressure returned to baseline and stable Postop Assessment: no apparent nausea or vomiting Anesthetic complications: no Comments: Upon arrival to PACU, patient with HR ~200 in what appeared to be narrow complex SVT. Single blood pressure recorded with SBP in 60's. Patient awake and alert throughout, denied any chest pain or SOB. Patient given 3mg  Metoprolol which worked quickly to slow his HR down to 80-100. EKG performed at that time, similar to previous EKGs. Patient continued to deny any pain or SOB. Next SBP >100. Hospitalist called and made aware of this event. Planned to give amiodarone and midodrine, but patient departed PACU back to floor prior to this. PACU RN instructed to let floor RN know to give the amiodarone and midodrine.    Last Vitals:  Vitals:   09/08/19 1215 09/08/19 1219  BP:  (!) 101/46  Pulse: 88 84  Resp: (!) 21 (!) 29  Temp:    SpO2: 100% 100%                  Audry Pili

## 2019-09-08 NOTE — Progress Notes (Signed)
Pharmacy Antibiotic Note  Per [name]. is a 79 y.o. male admitted on 09/03/2019 with wound infection.  Pharmacy has been consulted for Vancomycin dosing.  Currently there is a shortage of the reagent needed for vancomycin levels, therefore Zyvox was recommended instead of vanco in this patient with a wound infection.  Plan: Zyvox 600 mg IV q12hr Monitor clinical status and C&S.  Height: 5' 5.98" (167.6 cm) Weight: 53.7 kg (118 lb 6.2 oz) IBW/kg (Calculated) : 63.76  Temp (24hrs), Avg:98.2 F (36.8 C), Min:97.8 F (36.6 C), Max:98.8 F (37.1 C)  Recent Labs  Lab 09/03/19 1358 09/03/19 1358 09/03/19 2107 09/04/19 0615 09/05/19 0446 09/06/19 0328 09/07/19 0110  WBC 17.9*   < > 16.1* 16.4* 15.5* 18.0* 19.5*  CREATININE 5.70*  --   --   --  4.87* 6.15* 7.05*   < > = values in this interval not displayed.    Estimated Creatinine Clearance: 6.8 mL/min (A) (by C-G formula based on SCr of 7.05 mg/dL (H)).    Allergies  Allergen Reactions  . Aspirin Other (See Comments)    Causes internal bleeding  History of ulcers    Antimicrobials this admission: Zyvox 5/12 >>    Thank you for allowing pharmacy to be a part of this patient's care.  [name], PharmD, [facility] Clinical Pharmacist Please see AMION for all Pharmacists' Contact Phone Numbers 09/08/2019, 9:39 PM

## 2019-09-08 NOTE — TOC Progression Note (Signed)
Transition of Care (TOC) - Progression Note    Patient Details  Name: OSAMU OLGUIN Sr. MRN: [id number] Date of Birth: December 30, 1940  Transition of Care [facility]) CM/SW [place], LCSW Phone Number: 09/08/2019, 3:36 PM  Clinical Narrative:    CSW continuing to follow for medical readiness and will keep [facility] updated. Patient will discharge with a Prevena wound vac.   Expected Discharge Plan: Skilled Nursing Facility Barriers to Discharge: Continued Medical Work up  Expected Discharge Plan and Services Expected Discharge Plan: [place] In-house Referral: Clinical Social Work   Post Acute Care Choice: [place] Living arrangements for the past 2 months: [place]                                       Social Determinants of Health (SDOH) Interventions    Readmission Risk Interventions Readmission Risk Prevention Plan 09/08/2019 09/08/2019  Transportation Screening - Complete  PCP or Specialist Appt within 3-5 Days - Complete  HRI or [place] - Complete  Social Work Consult for [place] Planning/Counseling - Complete  Palliative Care Screening Complete Not Applicable  Medication Review Press photographer) - Complete  Some recent data might be hidden

## 2019-09-08 NOTE — Progress Notes (Signed)
PHARMACY NOTE:  ANTIMICROBIAL RENAL DOSAGE ADJUSTMENT  Current antimicrobial regimen includes a mismatch between antimicrobial dosage and estimated renal function.  As per policy approved by the Pharmacy & Therapeutics and Medical Executive Committees, the antimicrobial dosage will be adjusted accordingly.  Current antimicrobial dosage:  Cefazolin 1gm IV q6h x 3  Indication: surgical prophylaxis  Renal Function:  Estimated Creatinine Clearance: 6.8 mL/min (A) (by C-G formula based on SCr of 7.05 mg/dL (H)). []      On intermittent HD, scheduled: []      On CRRT    Antimicrobial dosage has been changed to:  Cefazolin 1gm IV q24h x 1  Additional comments:   Mathias Bogacki A. Levada Dy, PharmD, BCPS, FNKF Clinical Pharmacist [place] Please utilize Amion for appropriate phone number to reach the unit pharmacist ([place])   09/08/2019 1:24 PM

## 2019-09-09 DIAGNOSIS — K264 Chronic or unspecified duodenal ulcer with hemorrhage: Principal | ICD-10-CM

## 2019-09-09 DIAGNOSIS — I493 Ventricular premature depolarization: Secondary | ICD-10-CM

## 2019-09-09 LAB — RENAL FUNCTION PANEL
Albumin: 1.6 g/dL — ABNORMAL LOW (ref 3.5–5.0)
Anion gap: 10 (ref 5–15)
BUN: 29 mg/dL — ABNORMAL HIGH (ref 8–23)
CO2: 25 mmol/L (ref 22–32)
Calcium: 7.7 mg/dL — ABNORMAL LOW (ref 8.9–10.3)
Chloride: 103 mmol/L (ref 98–111)
Creatinine, Ser: 5.42 mg/dL — ABNORMAL HIGH (ref 0.61–1.24)
GFR calc Af Amer: 11 mL/min — ABNORMAL LOW (ref >60)
GFR calc non Af Amer: 9 mL/min — ABNORMAL LOW (ref >60)
Glucose, Bld: 102 mg/dL — ABNORMAL HIGH (ref 70–99)
Phosphorus: 3.6 mg/dL (ref 2.5–4.6)
Potassium: 3.9 mmol/L (ref 3.5–5.1)
Sodium: 138 mmol/L (ref 135–145)

## 2019-09-09 LAB — CBC WITH DIFFERENTIAL/PLATELET
Abs Immature Granulocytes: 0 10*3/uL (ref 0.00–0.07)
Basophils Absolute: 0 10*3/uL (ref 0.0–0.1)
Basophils Relative: 0 %
Eosinophils Absolute: 0.5 10*3/uL (ref 0.0–0.5)
Eosinophils Relative: 2 %
HCT: 26.4 % — ABNORMAL LOW (ref 39.0–52.0)
Hemoglobin: 8.2 g/dL — ABNORMAL LOW (ref 13.0–17.0)
Lymphocytes Relative: 5 %
Lymphs Abs: 1.2 10*3/uL (ref 0.7–4.0)
MCH: 20.8 pg — ABNORMAL LOW (ref 26.0–34.0)
MCHC: 31.1 g/dL (ref 30.0–36.0)
MCV: 66.8 fL — ABNORMAL LOW (ref 80.0–100.0)
Monocytes Absolute: 0.5 10*3/uL (ref 0.1–1.0)
Monocytes Relative: 2 %
Neutro Abs: 21.5 10*3/uL — ABNORMAL HIGH (ref 1.7–7.7)
Neutrophils Relative %: 91 %
Platelets: 263 10*3/uL (ref 150–400)
RBC: 3.95 MIL/uL — ABNORMAL LOW (ref 4.22–5.81)
RDW: 23.7 % — ABNORMAL HIGH (ref 11.5–15.5)
WBC: 23.6 10*3/uL — ABNORMAL HIGH (ref 4.0–10.5)
nRBC: 0 % (ref 0.0–0.2)
nRBC: 0 /100 WBC

## 2019-09-09 LAB — MAGNESIUM: Magnesium: 1.8 mg/dL (ref 1.7–2.4)

## 2019-09-09 MED ORDER — MAGNESIUM SULFATE 2 GM/50ML IV SOLN
2.0000 g | Freq: Once | INTRAVENOUS | Status: AC
  Administered 2019-09-09: 2 g via INTRAVENOUS
  Filled 2019-09-09: qty 50

## 2019-09-09 MED ORDER — AMIODARONE HCL 200 MG PO TABS
200.0000 mg | ORAL_TABLET | Freq: Every day | ORAL | Status: DC
  Administered 2019-09-10 – 2019-09-21 (×12): 200 mg via ORAL
  Filled 2019-09-09 (×12): qty 1

## 2019-09-09 NOTE — Plan of Care (Signed)
  Problem: Clinical Measurements: Goal: Will remain free from infection Outcome: Progressing Goal: Cardiovascular complication will be avoided Outcome: Progressing   Problem: Coping: Goal: Level of anxiety will decrease Outcome: Progressing   

## 2019-09-09 NOTE — Progress Notes (Signed)
PROGRESS NOTE    [name]  JEH:[id number] DOB: Jan 25, 1941 DOA: 09/03/2019 PCP: [name], MD    No chief complaint on file.   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 However in the preoperative area, patient noted to have rectal bleeding for which [facility] was consulted and advised that the procedure will need to be postponed.  Patient was subsequently admitted.    Assessment & Plan:   Principal Problem:   Acute GI bleeding Active Problems:   Encounter for central line placement   ESRD on dialysis [facility])   Aortic stenosis   PVD (peripheral vascular disease) ([place])   Essential hypertension   Duodenal ulcer   Palliative care encounter   DNR (do not resuscitate) discussion   Goals of care, counseling/discussion   Dehiscence of amputation stump ([place])   Chronic hypotension   SVT (supraventricular tachycardia) ([facility])   Ventricular ectopy  1 upper GI bleed secondary to duodenal ulcer/acute blood loss anemia Patient noted to have some rectal bleeding in the preop area prior to surgery and subsequently admitted.  Patient transfused a unit of packed  red blood cells 09/04/2019.  Plavix currently on hold.  Status post upper endoscopy 09/04/2019 that showed duodenal ulcer which was likely cause of patient's bleeding per GI.  Colonoscopy subsequently was not performed as patient also had a poor prep.  H&H stable at 8.2.  Continue PPI every 12 hours.  Continue to hold Plavix for now and orthopedics to recommend when Plavix may be resumed.  Repeat labs in the a.m.  Follow.  2.  End-stage renal disease on hemodialysis Patient noted per nephrology that dressing from wound VAC currently over patient's AV graft.  Per nephrology thigh graft not accessible for dialysis and assess patient will need a tunneled dialysis catheter.  Per nephrology.  3.  Progressive gangrenous necrotic changes with stump dehiscence of left transtibial amputation site Patient seen by orthopedics.  Patient subsequently underwent left AKA today(09/08/2019) without any complications.  Currently on Prevena boot.  Per orthopedics.  4.  Leukocytosis Likely secondary to problem #3.  Worsening leukocytosis.  Status post left AKA.  Continue IV Zyvox.  Follow.   5.  Chronic diastolic heart failure Compensated.  On hemodialysis.  Per nephrology.  6.  Chronic hypotension Continue home regimen midodrine  7. SVT Patient noted to have a run of SVT with heart rates as high as the 200s in the PACU postoperatively on 09/08/2019.  Patient received a dose of IV metoprolol and SVT broken and heart rate in the 70s.  Patient started on amiodarone per cardiology for ventricular ectopy.  Amiodarone dose increased to 200 mg daily per cardiology.  Follow.  8.  Ventricular ectopy Patient started on amiodarone per cardiology.  Amiodarone dose increased to 200 mg daily per cardiology.  Outpatient follow-up with cardiology.  Follow.  9.  Severe Aortic stenosis Noted on TEE during previous  admission.  Try to avoid hypotension.  Not a candidate for TAVR.  Cardiology following.  10.  Prolonged QTC Repeat EKG  pending.  Keep magnesium > 2.  Keep potassium greater > 4.  11.  Iron deficiency anemia/anemia of chronic disease H&H stable.  Follow.  12.  History of CVA Plavix currently on hold.  Orthopedics to advise when Plavix may be resumed.  13.  Generalized deconditioning Patient overall with a poor prognosis.  Palliative care has assessed and followed patient.  Patient now with partial code.  Patient wishes to have CPR and ACLS medication but no intubation or shock.  PT/OT.  Needs SNF.     DVT prophylaxis: Recent GI bleed.  Avoiding chemical prophylaxis.  SCDs. Code Status: Partial Family Communication: Updated patient. Disposition:   Status is: Inpatient    Dispo: The patient is from: SNF              Anticipated d/c is to: SNF              Anticipated d/c date is: Hopefully in 1-2 days.              Patient currently not medically stable for discharge.  Patient with a worsening leukocytosis post amputation.  On IV antibiotics.  Needs tunneled dialysis access catheter as dressing over thigh AV graft per nephrology.        Consultants:   Gastroenterology: Dr. Tarri Glenn 09/03/2019  Nephrology: Dr.Schertz 09/03/2019  Palliative care: Lindell Spar, NP 09/04/2019  Cardiology: Dr. Lovena Le 09/04/2019  Domenic Moras, FNP 09/07/2019  Orthopedics: Dr. Sharol Given  Procedures:   Chest x-ray 09/03/2019  Upper endoscopy 09/04/2019--per Dr. Tarri Glenn  Left above-the-knee amputation per Dr. Sharol Given 09/08/2019 EGD on 09/04/2019 Impression: - Duodenal ulcer may be the cause of recent overt  GI bleeding. 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 Biopsied. - Erosive gastropathy with no bleeding and no  stigmata of recent bleeding. Biopsied. - A single non-bleeding angioectasia in the  duodenum. - Small hiatal  hernia. - Unable to proceed with colonoscopy as planned due  to incomplete preparation. Patient had formed stool  on last bowel movement. Recommendation: - Return patient to hospital ward for ongoing care. - Clear liquid diet. Advance as tolerated. - No aspirin, ibuprofen, naproxen, or other  non-steroidal anti-inflammatory drugs. - Patient has a prolonged QT. Discussed with Dr.  Starla Link who agreed with trial of pantoprazole 40 mg  IV BID with close monitoring of QTc. - Will defer colonoscopy at this time given the  challenges that the patient has prepping for the  procedure. Duodenal ulcer may be the source of  bleeding. - Continue serial hgb/hct with transfusion as  indicated. Antimicrobials:   IV Ancef 09/08/2019 preoperatively  IV Zyvox 09/08/2019   Subjective: Patient sitting up in chair.  States he is feeling better.  Denies any chest pain.  No shortness of breath.  States lower extremity pain improved.  Tolerating current diet.    Objective: Vitals:   09/08/19 1900 09/08/19 2100 09/09/19 0100 09/09/19 0400  BP: 124/63 (!) 110/52 102/86 (!) 106/52  Pulse: 86 76 76 75  Resp: (!) 23 20 16 15   Temp:  98 F (36.7 C)  98.1 F (36.7 C)  TempSrc:  Oral  Oral  SpO2: 100% 100% 100% 98%  Weight:    55.9 kg  Height:        Intake/Output Summary (Last 24 hours) at 09/09/2019 1149 Last data filed at 09/09/2019 0920 Gross per 24 hour  Intake 390  ml  Output --  Net 390 ml   Filed Weights   09/07/19 1033 09/08/19 0327 09/09/19 0400  Weight: 55.7 kg 53.7 kg 55.9 kg     Examination:  General exam: NAD Respiratory system: Clear to auscultation bilaterally anterior lung fields.  No wheezes, no crackles, no rhonchi.  Speaking in full sentences. Cardiovascular system: Regular rate and rhythm with grade 2-3/6 SEM.  No JVD.  No pedal edema. Gastrointestinal system: Abdomen is soft, nontender, nondistended, positive bowel sounds.  No rebound.  No guarding.  Central nervous system: Alert and oriented. No focal neurological deficits. Extremities: Status post left BKA with wound VAC in place.  Status post right transmetatarsal amputation. Skin: No rashes, lesions or ulcers Psychiatry: Judgement and insight appear normal. Mood & affect appropriate.     Data Reviewed: I have personally reviewed following labs and imaging studies  CBC: Recent Labs  Lab 09/04/19 0615 09/04/19 0615 09/04/19 1948 09/05/19 0446 09/06/19 0328 09/07/19 0110 09/09/19 0500  WBC 16.4*  --   --  15.5* 18.0* 19.5* 23.6*  NEUTROABS  --   --   --  12.2* 13.5* 14.9* 21.5*  HGB 7.3*   < > 8.5* 8.3* 8.6* 8.4* 8.2*  HCT 23.5*   < > 25.8* 26.5* 26.4* 26.5* 26.4*  MCV 63.7*  --   --  66.4* 66.0* 65.6* 66.8*  PLT 293  --   --  295 288 281 263   < > = values in this interval not displayed.    Basic Metabolic Panel: Recent Labs  Lab 09/03/19 1358 09/05/19 0446 09/06/19 0328 09/07/19 0110 09/09/19 0500  NA 136 140 140 140 138  K 4.2 3.2* 3.4* 3.8 3.9  CL 98 104 103 103 103  CO2 26 25 26 24 25   GLUCOSE 78 87 88 89 102*  BUN 39* 24* 31* 37* 29*  CREATININE 5.70* 4.87* 6.15* 7.05* 5.42*  CALCIUM 9.2 8.7* 8.4* 8.2* 7.7*  MG 2.2  --  2.1  --  1.8  PHOS  --  3.9  --   --  3.6    GFR: Estimated Creatinine Clearance: 9.2 mL/min (A) (by C-G formula based on SCr of 5.42 mg/dL (H)).  Liver Function Tests: Recent Labs  Lab 09/03/19 1358 09/09/19 0500  AST 16  --   ALT 11  --   ALKPHOS 65  --   BILITOT 1.0  --   PROT 6.4*  --   ALBUMIN 1.7* 1.6*    CBG: Recent Labs  Lab  09/08/19 1117  GLUCAP 105*     Recent Results (from the past 240 hour(s))  Respiratory Panel by RT PCR (Flu A&B, Covid) - Nasopharyngeal Swab     Status: None   Collection Time: 09/03/19  8:18 AM   Specimen: Nasopharyngeal Swab  Result Value Ref Range Status   SARS Coronavirus 2 by RT PCR NEGATIVE NEGATIVE Final    Comment: (NOTE) SARS-CoV-2 target nucleic acids are NOT DETECTED. The SARS-CoV-2 RNA is generally detectable in upper respiratoy specimens during the acute phase of infection. The lowest concentration of SARS-CoV-2 viral copies this assay can detect is 131 copies/mL. A negative result does not preclude SARS-Cov-2 infection and should not be used as the sole basis for treatment or other patient management decisions. A negative result may occur with  improper specimen collection/handling, submission of specimen other than nasopharyngeal swab, presence of viral mutation(s) within the areas targeted by this assay, and inadequate number of viral copies (<131 copies/mL). A  negative result must be combined with clinical observations, patient history, and epidemiological information. The expected result is Negative. Fact Sheet for Patients:  PinkCheek.be Fact Sheet for Healthcare Providers:  GravelBags.it This test is not yet ap proved or cleared by the [place] FDA and  has been authorized for detection and/or diagnosis of SARS-CoV-2 by FDA under an Emergency Use Authorization (EUA). This EUA will remain  in effect (meaning this test can be used) for the duration of the COVID-19 declaration under Section 564(b)(1) of the Act, 21 U.S.C. section 360bbb-3(b)(1), unless the authorization is terminated or revoked sooner.    Influenza A by PCR NEGATIVE NEGATIVE Final   Influenza B by PCR NEGATIVE NEGATIVE Final    Comment: (NOTE) The Xpert Xpress SARS-CoV-2/FLU/RSV assay is intended as an aid in  the diagnosis of  influenza from Nasopharyngeal swab specimens and  should not be used as a sole basis for treatment. Nasal washings and  aspirates are unacceptable for Xpert Xpress SARS-CoV-2/FLU/RSV  testing. Fact Sheet for Patients: PinkCheek.be Fact Sheet for Healthcare Providers: GravelBags.it This test is not yet approved or cleared by the [place] FDA and  has been authorized for detection and/or diagnosis of SARS-CoV-2 by  FDA under an Emergency Use Authorization (EUA). This EUA will remain  in effect (meaning this test can be used) for the duration of the  Covid-19 declaration under Section 564(b)(1) of the Act, 21  U.S.C. section 360bbb-3(b)(1), unless the authorization is  terminated or revoked. Performed at [facility], [place] [address], [place], [place] [address]   MRSA PCR Screening     Status: None   Collection Time: 09/06/19  4:55 AM   Specimen: Nasopharyngeal  Result Value Ref Range Status   MRSA by PCR NEGATIVE NEGATIVE Final    Comment:        The GeneXpert MRSA Assay (FDA approved for NASAL specimens only), is one component of a comprehensive MRSA colonization surveillance program. It is not intended to diagnose MRSA infection nor to guide or monitor treatment for MRSA infections. Performed at [facility], [place] [address] [place], [place] [address]   Surgical pcr screen     Status: None   Collection Time: 09/07/19  7:45 PM   Specimen: Nasal Mucosa; Nasal Swab  Result Value Ref Range Status   MRSA, PCR NEGATIVE NEGATIVE Final   Staphylococcus aureus NEGATIVE NEGATIVE Final    Comment: (NOTE) The Xpert SA Assay (FDA approved for NASAL specimens in patients 66 years of age and older), is one component of a comprehensive surveillance program. It is not intended to diagnose infection nor to guide or monitor treatment. Performed at [facility], [place] [address] [place], [place] [address]           Radiology Studies: No results found.      Scheduled Meds: . [START ON 09/10/2019] amiodarone  200 mg Oral Daily  . vitamin C  500 mg Oral Q24H  . atorvastatin  40 mg Oral q1800  . B-complex with vitamin C  1 tablet Oral Daily  . Chlorhexidine Gluconate Cloth  6 each Topical Q0600  . Chlorhexidine Gluconate Cloth  6 each Topical Q0600  . cholecalciferol  1,000 Units Oral Daily  . cinacalcet  60 mg Oral QHS  . darbepoetin (ARANESP) injection - DIALYSIS  100 mcg Intravenous Q Tue-HD  . docusate sodium  100 mg Oral BID  . feeding supplement (NEPRO CARB STEADY)  237 mL Oral TID BM  .  feeding supplement (PRO-STAT SUGAR FREE 64)  30 mL Oral BID  . melatonin  3 mg Oral QHS  . midodrine  10 mg Oral TID WC  . pantoprazole  40 mg Oral BID  . sodium chloride flush  10-40 mL Intracatheter Q12H  . sodium chloride flush  3 mL Intravenous Q12H  . sodium chloride flush  3 mL Intravenous Q12H   Continuous Infusions: . sodium chloride    . sodium chloride    . sodium chloride Stopped (09/04/19 1236)  . ferric gluconate (FERRLECIT/NULECIT) IV 125 mg (09/06/19 2126)  . linezolid (ZYVOX) IV 600 mg (09/09/19 1001)  . magnesium sulfate bolus IVPB 2 g (09/09/19 1135)     LOS: 6 days    Time spent: 40 minutes    Irine Seal, MD [facility]   To contact the attending provider between 7A-7P or the covering provider during after hours 7P-7A, please log into the web site [email or web address] and access using universal [place] password for that web site. If you do not have the password, please call the hospital operator.  09/09/2019, 11:49 AM

## 2019-09-09 NOTE — Progress Notes (Signed)
I had the dialysis nurse assess his thigh graft.  She does not feel it will be accessible for dialysis.  Therefore, will need a tunneled dialysis catheter.  He will not have dialysis today.  We will perform dialysis once we have access  [name]

## 2019-09-09 NOTE — Progress Notes (Signed)
Progress Note  Patient Name: Joseph MACQUEEN Sr. Date of Encounter: 09/09/2019  Primary Cardiologist: Rozann Lesches, MD   Subjective   No CP or dyspnea  Inpatient Medications    Scheduled Meds: . amiodarone  200 mg Oral BID  . vitamin C  500 mg Oral Q24H  . atorvastatin  40 mg Oral q1800  . B-complex with vitamin C  1 tablet Oral Daily  . Chlorhexidine Gluconate Cloth  6 each Topical Q0600  . Chlorhexidine Gluconate Cloth  6 each Topical Q0600  . cholecalciferol  1,000 Units Oral Daily  . cinacalcet  60 mg Oral QHS  . darbepoetin (ARANESP) injection - DIALYSIS  100 mcg Intravenous Q Tue-HD  . docusate sodium  100 mg Oral BID  . feeding supplement (NEPRO CARB STEADY)  237 mL Oral TID BM  . feeding supplement (PRO-STAT SUGAR FREE 64)  30 mL Oral BID  . melatonin  3 mg Oral QHS  . midodrine  10 mg Oral TID WC  . pantoprazole  40 mg Oral BID  . sodium chloride flush  10-40 mL Intracatheter Q12H  . sodium chloride flush  3 mL Intravenous Q12H  . sodium chloride flush  3 mL Intravenous Q12H   Continuous Infusions: . sodium chloride    . sodium chloride    . sodium chloride Stopped (09/04/19 1236)  . sodium chloride 50 mL/hr at 09/08/19 1413  . ferric gluconate (FERRLECIT/NULECIT) IV 125 mg (09/06/19 2126)  . linezolid (ZYVOX) IV 600 mg (09/08/19 2205)   PRN Meds: sodium chloride, sodium chloride, sodium chloride, acetaminophen **OR** acetaminophen, albuterol, HYDROmorphone (DILAUDID) injection, metoCLOPramide **OR** metoCLOPramide (REGLAN) injection, ondansetron **OR** ondansetron (ZOFRAN) IV, oxyCODONE, sodium chloride flush, sodium chloride flush   Vital Signs    Vitals:   09/08/19 1900 09/08/19 2100 09/09/19 0100 09/09/19 0400  BP: 124/63 (!) 110/52 102/86 (!) 106/52  Pulse: 86 76 76 75  Resp: (!) 23 20 16 15   Temp:  98 F (36.7 C)  98.1 F (36.7 C)  TempSrc:  Oral  Oral  SpO2: 100% 100% 100% 98%  Weight:    55.9 kg  Height:        Intake/Output Summary (Last  24 hours) at 09/09/2019 0938 Last data filed at 09/08/2019 1900 Gross per 24 hour  Intake 840 ml  Output 100 ml  Net 740 ml   Last 3 Weights 09/09/2019 09/08/2019 09/07/2019  Weight (lbs) 123 lb 3.8 oz 118 lb 6.2 oz 122 lb 12.7 oz  Weight (kg) 55.9 kg 53.7 kg 55.7 kg      Telemetry    Sinus with PVCs and 3 beats NSVT - Personally Reviewed  Physical Exam   GEN: No acute distress.   Neck: No JVD Cardiac: RRR, 3/6 systolic murmur Respiratory: Clear to auscultation bilaterally. GI: Soft, nontender, non-distended  MS: No edema; s/p L AKA Neuro:  Nonfocal  Psych: Normal affect   Labs     Chemistry Recent Labs  Lab 09/03/19 1358 09/05/19 0446 09/06/19 0328 09/07/19 0110 09/09/19 0500  NA 136   < > 140 140 138  K 4.2   < > 3.4* 3.8 3.9  CL 98   < > 103 103 103  CO2 26   < > 26 24 25   GLUCOSE 78   < > 88 89 102*  BUN 39*   < > 31* 37* 29*  CREATININE 5.70*   < > 6.15* 7.05* 5.42*  CALCIUM 9.2   < > 8.4* 8.2* 7.7*  PROT 6.4*  --   --   --   --   ALBUMIN 1.7*  --   --   --  1.6*  AST 16  --   --   --   --   ALT 11  --   --   --   --   ALKPHOS 65  --   --   --   --   BILITOT 1.0  --   --   --   --   GFRNONAA 9*   < > 8* 7* 9*  GFRAA 10*   < > 9* 8* 11*  ANIONGAP 12   < > 11 13 10    < > = values in this interval not displayed.     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 However in the preoperative area, patient noted to have rectal bleeding for which [facility] was consulted and advised that the procedure will need to be postponed.Patient was subsequently admitted.  Assessment & Plan    1 Ventricular ectopy-noted prior to surgery; placed on amiodarone by Dr Lovena Le; change to 200 mg daily.   2 Severe AS-not presently a candidate for TAVR; FU Dr Domenic Polite following DC.  3 S/P left AKA-per orthopedics.   4 Chronic diastolic CHF-euvolemic; volume per dialysis.  5 ESRD  6 Chronic hypotension-continue midodrine.  7 SVT-apparently had episode in PACU; continue amiodarone.  Cardiology will sign off; pt to FU with Dr Domenic Polite 6-8 weeks following DC; continue present cardiac meds; please call with questions.  For questions or updates, please contact [place] Please consult [email or web address] for contact info under        Signed, Kirk Ruths, MD  09/09/2019, 9:38 AM

## 2019-09-09 NOTE — Progress Notes (Signed)
Status post above-knee amputation nursing at bedside patient is awake with minimal response to questioning.  VAC is in place 0 cc drainage when medically stable would transfer back to skilled nursing

## 2019-09-09 NOTE — Progress Notes (Signed)
Physical Therapy Treatment Patient Details Name: [name]. MRN: [id number] DOB: 1941/04/27 Today's Date: 09/09/2019    History of Present Illness Pt is a 79 y/o male now s/p L transfemoral amputation on 5/12. On admission, pt incidentally found to have rectal bleeding. PMH including but not limited to R LE transmetatarsal amp, HTN, arthritis, CVA with residual L sided weakness, arthritis, anxiety, ESRD M/W/F, recent fall with R hip avulasion fx WBAT with no medical mgmt.  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    PT Comments    Pt states his goal for PT/OT today is to "take a few steps, get to the recliner". Pt required significant physical assist +2 for bed mobility and transfer to stand, with standing tolerance ~10 seconds before fatiguing. Pt with improving transfer form with repeated transfer training this day, and successfully transferred to recliner with use of stedy. PT continuing to recommend SNF level of care post-acutely, will continue to follow acutely.    Follow Up Recommendations  SNF     Equipment Recommendations  None recommended by PT    Recommendations for Other Services       Precautions / Restrictions Precautions Precautions: Fall;Other (comment) Precaution Comments: R TMA, new L transfemoral amputation, wound VAC Restrictions Weight Bearing Restrictions: Yes RLE Weight Bearing: Weight bearing as tolerated LLE Weight Bearing: Non weight bearing    Mobility  Bed Mobility Overal bed mobility: Needs Assistance Bed Mobility: Rolling;Sidelying to Sit Rolling: Min assist Sidelying to sit: Mod assist;+2 for physical assistance;+2 for safety/equipment;HOB elevated       General bed mobility comments: min assist to roll to L for sequencing assist, L residual limb management. Mod +2 for sidelying to sit for trunk elevation, scooting to EOB with use of pt weight shifting and bed pads. Very increased time and effort,  limited by pain.  Transfers Overall transfer level: Needs assistance Equipment used: Rolling walker (2 wheeled);Ambulation equipment used Transfers: Sit to/from Stand Sit to Stand: Mod assist;+2 physical assistance;+2 safety/equipment;From elevated surface         General transfer comment: Mod +2 for power up, hip extension to upright standing, and steadying. Verbal cuing for safe hand placement. Stand attempts x2 with use of RW, more successful hip clearance and tolerance for upright with use of stedy. Transferred to recliner with stedy.  Ambulation/Gait                 Stairs             Wheelchair Mobility    Modified Rankin (Stroke Patients Only)       Balance Overall balance assessment: Needs assistance Sitting-balance support: Feet supported;Bilateral upper extremity supported Sitting balance-Leahy Scale: Fair Sitting balance - Comments: posterior leaning initially requiring posterior truncal support, transitioning to supervision assist when pt gained balance. Sat EOB for a total of ~10 minutes this day.   Standing balance support: Bilateral upper extremity supported;During functional activity Standing balance-Leahy Scale: Poor Standing balance comment: reliant on external assist                            Cognition Arousal/Alertness: Awake/alert Behavior During Therapy: Flat affect Overall Cognitive Status: No family/caregiver present to determine baseline cognitive functioning Area of Impairment: Following commands;Safety/judgement;Problem solving                       Following Commands: Follows one step  commands with increased time;Follows one step commands consistently Safety/Judgement: Decreased awareness of deficits;Decreased awareness of safety   Problem Solving: Requires verbal cues;Requires tactile cues;Difficulty sequencing;Decreased initiation General Comments: Pt pleasant, is very soft-spoken. Pt requires frequent  safety cues during mobility, follows commands well.      Exercises      General Comments General comments (skin integrity, edema, etc.): VSS, other than RR up to 39 breaths/min with exertion      Pertinent Vitals/Pain Pain Assessment: Faces Faces Pain Scale: Hurts little more Pain Location: L residual limb Pain Descriptors / Indicators: Guarding;Grimacing;Discomfort Pain Intervention(s): Limited activity within patient's tolerance;Monitored during session;Repositioned;Relaxation    Home Living                      Prior Function            PT Goals (current goals can now be found in the care plan section) Acute Rehab PT Goals PT Goal Formulation: With patient Time For Goal Achievement: 09/19/19 Potential to Achieve Goals: Fair Progress towards PT goals: Progressing toward goals    Frequency    Min 2X/week      PT Plan Current plan remains appropriate    Co-evaluation PT/OT/SLP Co-Evaluation/Treatment: Yes Reason for Co-Treatment: For patient/therapist safety;To address functional/ADL transfers PT goals addressed during session: Mobility/safety with mobility;Balance;Proper use of DME        AM-PAC PT "6 Clicks" Mobility   Outcome Measure  Help needed turning from your back to your side while in a flat bed without using bedrails?: A Little Help needed moving from lying on your back to sitting on the side of a flat bed without using bedrails?: A Lot Help needed moving to and from a bed to a chair (including a wheelchair)?: Total Help needed standing up from a chair using your arms (e.g., wheelchair or bedside chair)?: Total Help needed to walk in hospital room?: Total Help needed climbing 3-5 steps with a railing? : Total 6 Click Score: 9    End of Session   Activity Tolerance: Patient limited by fatigue;Patient tolerated treatment well Patient left: in chair;with chair alarm set;with call bell/phone within reach Nurse Communication: Mobility  status;Need for lift equipment(stedy back to bed) PT Visit Diagnosis: Other abnormalities of gait and mobility (R26.89);Muscle weakness (generalized) (M62.81) Pain - Right/Left: Left Pain - part of body: Leg     Time: 2992-4268 PT Time Calculation (min) (ACUTE ONLY): 36 min  Charges:  $Therapeutic Activity: 8-22 mins                     Osamu Olguin E, PT Acute Rehabilitation Services Pager [phone]  Office [phone]    Baylee Mccorkel D Elonda Husky 09/09/2019, 11:52 AM

## 2019-09-09 NOTE — Progress Notes (Signed)
[facility] Progress Note   Subjective:   NAD this AM ready to work with PT.   I agree that wound vac or at least dressing for it is over AVG-  Will need to see if dressing can be peeled back enough to access AVG for HD-  Can appreciate a thrill  Objective Vitals:   09/08/19 1900 09/08/19 2100 09/09/19 0100 09/09/19 0400  BP: 124/63 (!) 110/52 102/86 (!) 106/52  Pulse: 86 76 76 75  Resp: (!) 23 20 16 15   Temp:  98 F (36.7 C)  98.1 F (36.7 C)  TempSrc:  Oral  Oral  SpO2: 100% 100% 100% 98%  Weight:    55.9 kg  Height:       Physical Exam General:chronically ill appearing, frail male in NAD Heart:RRR, +7/0 systolic murmur Lungs:CTAB anteriolaterally Abdomen:soft, NTND Extremities:L AKA with wound vac Dialysis Access: L thigh AVG +b but as above dressing over AVG  Filed Weights   09/07/19 1033 09/08/19 0327 09/09/19 0400  Weight: 55.7 kg 53.7 kg 55.9 kg    Intake/Output Summary (Last 24 hours) at 09/09/2019 0948 Last data filed at 09/08/2019 1900 Gross per 24 hour  Intake 840 ml  Output 100 ml  Net 740 ml    Additional Objective Labs: Basic Metabolic Panel: Recent Labs  Lab 09/05/19 0446 09/05/19 0446 09/06/19 0328 09/07/19 0110 09/09/19 0500  NA 140   < > 140 140 138  K 3.2*   < > 3.4* 3.8 3.9  CL 104   < > 103 103 103  CO2 25   < > 26 24 25   GLUCOSE 87   < > 88 89 102*  BUN 24*   < > 31* 37* 29*  CREATININE 4.87*   < > 6.15* 7.05* 5.42*  CALCIUM 8.7*   < > 8.4* 8.2* 7.7*  PHOS 3.9  --   --   --  3.6   < > = values in this interval not displayed.   Liver Function Tests: Recent Labs  Lab 09/03/19 1358 09/09/19 0500  AST 16  --   ALT 11  --   ALKPHOS 65  --   BILITOT 1.0  --   PROT 6.4*  --   ALBUMIN 1.7* 1.6*   CBC: Recent Labs  Lab 09/04/19 0615 09/04/19 1948 09/05/19 0446 09/05/19 0446 09/06/19 0328 09/07/19 0110 09/09/19 0500  WBC 16.4*  --  15.5*   < > 18.0* 19.5* 23.6*  NEUTROABS  --   --  12.2*   < > 13.5* 14.9*  21.5*  HGB 7.3*   < > 8.3*   < > 8.6* 8.4* 8.2*  HCT 23.5*   < > 26.5*   < > 26.4* 26.5* 26.4*  MCV 63.7*  --  66.4*  --  66.0* 65.6* 66.8*  PLT 293  --  295   < > 288 281 263   < > = values in this interval not displayed.   Iron Studies:  No results for input(s): IRON, TIBC, TRANSFERRIN, FERRITIN in the last 72 hours. Lab Results  Component Value Date   INR 1.1 12/01/2018   INR 1.03 01/01/2018   INR 1.03 11/27/2017   Studies/Results: No results found.  Medications: . sodium chloride    . sodium chloride    . sodium chloride Stopped (09/04/19 1236)  . sodium chloride 50 mL/hr at 09/08/19 1413  . ferric gluconate (FERRLECIT/NULECIT) IV 125 mg (09/06/19 2126)  . linezolid (ZYVOX) IV 600 mg (  09/08/19 2205)   . amiodarone  200 mg Oral BID  . vitamin C  500 mg Oral Q24H  . atorvastatin  40 mg Oral q1800  . B-complex with vitamin C  1 tablet Oral Daily  . Chlorhexidine Gluconate Cloth  6 each Topical Q0600  . Chlorhexidine Gluconate Cloth  6 each Topical Q0600  . cholecalciferol  1,000 Units Oral Daily  . cinacalcet  60 mg Oral QHS  . darbepoetin (ARANESP) injection - DIALYSIS  100 mcg Intravenous Q Tue-HD  . docusate sodium  100 mg Oral BID  . feeding supplement (NEPRO CARB STEADY)  237 mL Oral TID BM  . feeding supplement (PRO-STAT SUGAR FREE 64)  30 mL Oral BID  . melatonin  3 mg Oral QHS  . midodrine  10 mg Oral TID WC  . pantoprazole  40 mg Oral BID  . sodium chloride flush  10-40 mL Intracatheter Q12H  . sodium chloride flush  3 mL Intravenous Q12H  . sodium chloride flush  3 mL Intravenous Q12H    Dialysis Orders: [facility] - MWF- 4 hours-  Thigh AVG-  BFR 400/DFR800-  2 K bath as OP-  EDW 62.  Gets heparin per tx-  2000 units per hour, epo 4000 and sensipar 60 mg.  Last hgb there was 4/26  9.9   Assessment/Plan: 1. GIB - EGD w/duodenal bulb ulcer. Gastric bx results pending. PPI BID started, to monitor close d/t prolonged QT. Unable to complete  colonoscopy. Hgb fairly stable s/p 1unit pRBC upon admission. 2. 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   3. Ventricular ectopy - asymptomatic. Cards starting amiodarone PO.  4. ESRD- On HD MWF. 4 K bath.  Off schedule last Tx 5/11.  Now has wound vac overlying his AVG, this might present a problem with cannuation.  WIll reassess tomorrow, see day by day HD needs and status of bandaging.  If cannot access today may need a TDC temporarily   5. Hypotension/volume- Blood pressure soft. Does not appear volume overloaded. On midodrine TID.    6. Anemiaof CKD- Hgb stable in 8s  s/p 1unit pRBC. Tsat 11%, iron ordered.   on ESA 7. Secondary Hyperparathyroidism -Ca in goal. Phos 3.6 no binder.  Continue sensipar- does not get vit D with HD-  8. Nutrition- Renal diet w/fluid restrictions  Louis Meckel [facility] 09/09/2019,9:48 AM  LOS: 6 days

## 2019-09-09 NOTE — Progress Notes (Signed)
Occupational Therapy Re-Evaluation Patient Details Name: Joseph TOTO Sr. MRN: [id number] DOB: [birth year] Today's Date: 09/09/2019    History of Present Illness Pt is a 79 y/o male now s/p L transfemoral amputation on 5/12. On admission, pt incidentally found to have rectal bleeding. PMH including but not limited to R LE transmetatarsal amp, HTN, arthritis, CVA with residual L sided weakness, arthritis, anxiety, ESRD M/W/F, recent fall with R hip avulasion fx WBAT with no medical mgmt.  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   Clinical Impression   Patient very motivated to do therapy today.  He required mod assist x2 to get to EOB.  Initially needed max assist to maintain seated balance but improved to min guard after a few minutes.  Patient stood with mod assist x2 and RW two times, becoming fatigued after about 10 seconds.  Used stedy to transfer to chair which worked very well.  Patient would benefit from strengthening UE and increasing ROM to improve functional mobility.  Will continue to follow with OT acutely to address the deficits listed below.      Follow Up Recommendations  SNF;Supervision/Assistance - 24 hour    Equipment Recommendations  Other (comment)(defer to SNF)    Recommendations for Other Services       Precautions / Restrictions Precautions Precautions: Fall;Other (comment) Precaution Comments: R TMA, new L transfemoral amputation, wound VAC Restrictions Weight Bearing Restrictions: Yes RLE Weight Bearing: Weight bearing as tolerated LLE Weight Bearing: Non weight bearing      Mobility Bed Mobility Overal bed mobility: Needs Assistance Bed Mobility: Rolling;Sidelying to Sit Rolling: Min assist Sidelying to sit: Mod assist;+2 for physical assistance;+2 for safety/equipment;HOB elevated       General bed mobility comments: min assist to roll to L for sequencing assist, L residual limb management. Mod +2 for sidelying  to sit for trunk elevation, scooting to EOB with use of pt weight shifting and bed pads. Very increased time and effort, limited by pain.  Transfers Overall transfer level: Needs assistance Equipment used: Rolling walker (2 wheeled);Ambulation equipment used Transfers: Sit to/from Stand Sit to Stand: Mod assist;+2 physical assistance;+2 safety/equipment;From elevated surface             Balance Overall balance assessment: Needs assistance Sitting-balance support: Feet supported;Bilateral upper extremity supported Sitting balance-Leahy Scale: Fair Sitting balance - Comments: posterior leaning initially requiring posterior truncal support, transitioning to supervision assist when pt gained balance. Sat EOB for a total of ~10 minutes this day.   Standing balance support: Bilateral upper extremity supported;During functional activity Standing balance-Leahy Scale: Poor Standing balance comment: reliant on external assist                           ADL either performed or assessed with clinical judgement   ADL Overall ADL's : Needs assistance/impaired Eating/Feeding: Minimal assistance;Sitting   Grooming: Minimal assistance;Sitting   Upper Body Bathing: Moderate assistance;Sitting   Lower Body Bathing: Moderate assistance;Sitting/lateral leans   Upper Body Dressing : Moderate assistance;Sitting   Lower Body Dressing: Maximal assistance;Sitting/lateral leans   Toilet Transfer: Maximal assistance;+2 for physical assistance           Functional mobility during ADLs: +2 for physical assistance;+2 for safety/equipment;Maximal assistance General ADL Comments: Patient sat EOB for 10 min, starting at max assist to maintain balance and improving to min guard     Vision  Perception     Praxis      Pertinent Vitals/Pain Pain Assessment: Faces Faces Pain Scale: Hurts little more Pain Location: L residual limb Pain Descriptors / Indicators:  Guarding;Grimacing;Discomfort Pain Intervention(s): Monitored during session;Limited activity within patient's tolerance;Repositioned     Hand Dominance     Extremity/Trunk Assessment Upper Extremity Assessment Upper Extremity Assessment: Generalized weakness;RUE deficits/detail;LUE deficits/detail RUE Deficits / Details: Middle finger amputation. AROM limitations. Shoulder ~100, elbow extension 150 LUE Deficits / Details: AROM limitations. Shoulder ~100, elbow extension 150           Communication     Cognition Arousal/Alertness: Awake/alert Behavior During Therapy: Flat affect Overall Cognitive Status: No family/caregiver present to determine baseline cognitive functioning Area of Impairment: Following commands;Safety/judgement;Problem solving                       Following Commands: Follows one step commands with increased time;Follows one step commands consistently Safety/Judgement: Decreased awareness of deficits;Decreased awareness of safety   Problem Solving: Requires verbal cues;Requires tactile cues;Difficulty sequencing;Decreased initiation General Comments: Pt pleasant, is very soft-spoken. Pt requires frequent safety cues during mobility, follows commands well.   General Comments  RR increase to 39 during actvity    Exercises     Shoulder Instructions      Home Living                                          Prior Functioning/Environment                   OT Problem List:        OT Treatment/Interventions:      OT Goals(Current goals can be found in the care plan section) Acute Rehab OT Goals Patient Stated Goal: Walk some steps OT Goal Formulation: With patient Time For Goal Achievement: 09/19/19 Potential to Achieve Goals: Good  OT Frequency: Min 2X/week   Barriers to D/C:            Co-evaluation PT/OT/SLP Co-Evaluation/Treatment: Yes Reason for Co-Treatment: For patient/therapist safety;To address  functional/ADL transfers PT goals addressed during session: Mobility/safety with mobility;Balance;Proper use of DME OT goals addressed during session: Strengthening/ROM;ADL's and self-care      AM-PAC OT "6 Clicks" Daily Activity     Outcome Measure Help from another person eating meals?: A Little Help from another person taking care of personal grooming?: A Lot Help from another person toileting, which includes using toliet, bedpan, or urinal?: A Lot Help from another person bathing (including washing, rinsing, drying)?: A Lot Help from another person to put on and taking off regular upper body clothing?: A Lot Help from another person to put on and taking off regular lower body clothing?: A Lot 6 Click Score: 13   End of Session Equipment Utilized During Treatment: Gait belt;Rolling walker Nurse Communication: Mobility status  Activity Tolerance: Patient tolerated treatment well;Patient limited by pain Patient left: in chair;with call bell/phone within reach;with chair alarm set  OT Visit Diagnosis: Other abnormalities of gait and mobility (R26.89);Muscle weakness (generalized) (M62.81)                Time: 2505-3976 OT Time Calculation (min): 36 min Charges:  OT General Charges $OT Visit: 1 Visit OT Evaluation $OT Re-eval: 1 Re-eval  August Luz, OTR/L   Phylliss Bob 09/09/2019, 1:52 PM

## 2019-09-10 ENCOUNTER — Inpatient Hospital Stay (HOSPITAL_COMMUNITY): Payer: Medicare Other

## 2019-09-10 HISTORY — PX: IR US GUIDE VASC ACCESS LEFT: IMG2389

## 2019-09-10 HISTORY — PX: IR FLUORO GUIDE CV LINE LEFT: IMG2282

## 2019-09-10 LAB — CBC WITH DIFFERENTIAL/PLATELET
Abs Immature Granulocytes: 0.28 10*3/uL — ABNORMAL HIGH (ref 0.00–0.07)
Basophils Absolute: 0.1 10*3/uL (ref 0.0–0.1)
Basophils Relative: 0 %
Eosinophils Absolute: 0.4 10*3/uL (ref 0.0–0.5)
Eosinophils Relative: 2 %
HCT: 25.1 % — ABNORMAL LOW (ref 39.0–52.0)
Hemoglobin: 8.1 g/dL — ABNORMAL LOW (ref 13.0–17.0)
Immature Granulocytes: 1 %
Lymphocytes Relative: 13 %
Lymphs Abs: 3.6 10*3/uL (ref 0.7–4.0)
MCH: 21.1 pg — ABNORMAL LOW (ref 26.0–34.0)
MCHC: 32.3 g/dL (ref 30.0–36.0)
MCV: 65.5 fL — ABNORMAL LOW (ref 80.0–100.0)
Monocytes Absolute: 1.5 10*3/uL — ABNORMAL HIGH (ref 0.1–1.0)
Monocytes Relative: 5 %
Neutro Abs: 21.6 10*3/uL — ABNORMAL HIGH (ref 1.7–7.7)
Neutrophils Relative %: 79 %
Platelets: 306 10*3/uL (ref 150–400)
RBC: 3.83 MIL/uL — ABNORMAL LOW (ref 4.22–5.81)
RDW: 23.2 % — ABNORMAL HIGH (ref 11.5–15.5)
WBC: 27.5 10*3/uL — ABNORMAL HIGH (ref 4.0–10.5)
nRBC: 0.1 % (ref 0.0–0.2)

## 2019-09-10 LAB — RENAL FUNCTION PANEL
Albumin: 1.5 g/dL — ABNORMAL LOW (ref 3.5–5.0)
Anion gap: 14 (ref 5–15)
BUN: 36 mg/dL — ABNORMAL HIGH (ref 8–23)
CO2: 23 mmol/L (ref 22–32)
Calcium: 7.8 mg/dL — ABNORMAL LOW (ref 8.9–10.3)
Chloride: 100 mmol/L (ref 98–111)
Creatinine, Ser: 6.79 mg/dL — ABNORMAL HIGH (ref 0.61–1.24)
GFR calc Af Amer: 8 mL/min — ABNORMAL LOW (ref >60)
GFR calc non Af Amer: 7 mL/min — ABNORMAL LOW (ref >60)
Glucose, Bld: 91 mg/dL (ref 70–99)
Phosphorus: 3.8 mg/dL (ref 2.5–4.6)
Potassium: 4.3 mmol/L (ref 3.5–5.1)
Sodium: 137 mmol/L (ref 135–145)

## 2019-09-10 LAB — MAGNESIUM: Magnesium: 2.4 mg/dL (ref 1.7–2.4)

## 2019-09-10 MED ORDER — LIDOCAINE HCL (PF) 1 % IJ SOLN
INTRAMUSCULAR | Status: AC | PRN
  Administered 2019-09-10: 10 mL

## 2019-09-10 MED ORDER — CHLORHEXIDINE GLUCONATE 4 % EX LIQD
CUTANEOUS | Status: AC
  Filled 2019-09-10: qty 15

## 2019-09-10 MED ORDER — HEPARIN SODIUM (PORCINE) 1000 UNIT/ML IJ SOLN
INTRAMUSCULAR | Status: AC
  Administered 2019-09-11: 1000 [IU]
  Filled 2019-09-10: qty 1

## 2019-09-10 MED ORDER — LIDOCAINE HCL 1 % IJ SOLN
INTRAMUSCULAR | Status: AC
  Filled 2019-09-10: qty 20

## 2019-09-10 NOTE — Progress Notes (Signed)
Patient is postop day 2 status post above-knee amputation.  He seems more alert today and answer simple questions.  Vital signs stable could not appreciate no new drainage is appreciated in the wound VAC.  Appears comfortable.  Plan for discharge to skilled nursing facility with follow-up with orthopedics in 1 week

## 2019-09-10 NOTE — Progress Notes (Signed)
PROGRESS NOTE    [name]  FIE:[id number] DOB: Oct 06, 1940 DOA: 09/03/2019 PCP: [name], MD    No chief complaint on file.   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 However in the preoperative area, patient noted to have rectal bleeding for which [facility] was consulted and advised that the procedure will need to be postponed.  Patient was subsequently admitted.    Assessment & Plan:   Principal Problem:   GI bleeding Active Problems:   Encounter for central line placement   ESRD on dialysis [facility])   Aortic stenosis   PVD (peripheral vascular disease) ([place])   Essential hypertension   Duodenal ulcer   Palliative care encounter   DNR (do not resuscitate) discussion   Goals of care, counseling/discussion   Dehiscence of amputation stump ([place])   Chronic hypotension   SVT (supraventricular tachycardia) ([facility])   Ventricular ectopy  1 upper GI bleed secondary to duodenal ulcer/acute blood loss anemia Patient noted to have some rectal bleeding in the preop area prior to surgery and subsequently admitted.  Patient transfused a unit of packed red  blood cells 09/04/2019.  Plavix currently on hold.  Status post upper endoscopy 09/04/2019 that showed duodenal ulcer which was likely cause of patient's bleeding per GI.  Colonoscopy subsequently was not performed as patient also had a poor prep.  H&H stable at 8.1.  Continue PPI every 12 hours.  Continue to hold Plavix for now and orthopedics to recommend when Plavix may be resumed.  Repeat labs in the a.m.  Follow.  2.  End-stage renal disease on hemodialysis Patient noted per nephrology that dressing from wound VAC currently over patient's AV graft.  Per nephrology thigh graft not accessible for dialysis and assess patient will need a tunneled dialysis catheter.  Patient seen by IR and underwent exchange of left IJ central line for triple-lumen HD catheter by Dr. Laurence Ferrari.  For hemodialysis this afternoon/evening.  Per nephrology.  3.  Progressive gangrenous necrotic changes with stump dehiscence of left transtibial amputation site Patient seen by orthopedics.  Patient subsequently underwent left AKA today(09/08/2019) without any complications.  Currently on Prevena boot.  Per orthopedics.  4.  Leukocytosis Likely secondary to problem #3.  Worsening leukocytosis.  Status post left AKA.  Patient started on IV Zyvox.  Repeat blood cultures.  Due to worsening leukocytosis ID consulted who recommended discontinuation of antibiotics, MRI of the right foot, and consideration for CT chest abdomen and pelvis if worsening WBC and no cause found.  ID following.  Follow.   5.  Chronic diastolic heart failure Compensated.  On hemodialysis.  Per nephrology.  6.  Chronic hypotension Continue midodrine.   7. SVT Patient noted to have a run of SVT with heart rates as high as the 200s in the PACU postoperatively on 09/08/2019.  Patient received a dose of IV metoprolol and SVT broken and heart rate  in the 70s.  Patient started on amiodarone per cardiology for ventricular ectopy.  Amiodarone dose increased to 200 mg  daily per cardiology.  Follow.  8.  Ventricular ectopy Patient started on amiodarone per cardiology.  Amiodarone dose increased to 200 mg daily per cardiology.  Outpatient follow-up with cardiology.  Follow.  9.  Severe Aortic stenosis Noted on TEE during previous admission.  Try to avoid hypotension.  Not a candidate for TAVR.  Cardiology following.  10.  Prolonged QTC Repeat EKG pending.  Keep magnesium > 2.  Keep potassium greater > 4.  11.  Iron deficiency anemia/anemia of chronic disease H&H stable at 8.1.  Follow.  12.  History of CVA Plavix currently on hold.  Orthopedics to advise when Plavix may be resumed.  Could potentially likely resume Plavix in the next 1 to 2 days.  13.  Generalized deconditioning Patient overall with a poor prognosis.  Palliative care has assessed and followed patient.  Patient now with partial code.  Patient wishes to have CPR and ACLS medication but no intubation or shock.  PT/OT.  Needs SNF.     DVT prophylaxis: Recent GI bleed.  Avoiding chemical prophylaxis.  SCDs. Code Status: Partial Family Communication: Updated patient and daughter [name] at bedside. Disposition:   Status is: Inpatient    Dispo: The patient is from: SNF              Anticipated d/c is to: SNF              Anticipated d/c date is: Hopefully on Monday, 09/13/2019              Patient currently not medically stable for discharge.  Patient with a worsening leukocytosis post amputation.  On IV antibiotics.  Needs tunneled dialysis access catheter as dressing over thigh AV graft per nephrology which was done today 09/10/2019.        Consultants:   Gastroenterology: Dr. Tarri Glenn 09/03/2019  Nephrology: Dr.Schertz 09/03/2019  Palliative care: Lindell Spar, NP 09/04/2019  Cardiology: Dr. Lovena Le 09/04/2019  Domenic Moras, FNP 09/07/2019  Orthopedics: Dr. Sharol Given  ID: Dr. Tommy Medal 09/10/2019  Procedures:   Chest x-ray 09/03/2019  Upper endoscopy 09/04/2019--per Dr. Tarri Glenn  Left  above-the-knee amputation per Dr. Sharol Given 09/08/2019 EGD on 09/04/2019 Impression: - Duodenal ulcer may be the cause of recent overt  GI bleeding. 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 Biopsied. - Erosive gastropathy with no bleeding and no  stigmata of recent bleeding. Biopsied. - A single non-bleeding angioectasia in the  duodenum. - Small hiatal hernia. - Unable to proceed with colonoscopy as planned due  to incomplete preparation. Patient had formed stool  on last bowel movement. Recommendation: - Return patient to hospital ward for ongoing care. - Clear liquid diet. Advance as tolerated. - No aspirin, ibuprofen, naproxen, or other  non-steroidal anti-inflammatory drugs. - Patient has a prolonged QT. Discussed with Dr.  Starla Link who agreed with trial of pantoprazole 40 mg  IV BID with close monitoring of QTc. - Will defer colonoscopy at this time given the  challenges that the patient has prepping for the  procedure. Duodenal ulcer may be the source of  bleeding. - Continue serial hgb/hct with transfusion as  indicated.  Exchange of left IJ central line for triple-lumen HD catheter--per IR Dr. Laurence Ferrari 09/10/2019  Antimicrobials:   IV Ancef 09/08/2019 preoperatively  IV Zyvox 09/08/2019>>>>> 09/10/2019   Subjective: Patient just returned from IR after exchange of left IJ central line for triple-lumen  HD  catheter.  Patient denies any chest pain.  No shortness of breath.  Feeling better.  Some complaints of some numbness on the tip of his tongue.  Feels his appetite is slowly improving.   Objective: Vitals:   09/09/19 1513 09/09/19 2000 09/10/19 0400 09/10/19 1300  BP: (!) 109/57 (!) 102/55 (!) 103/44 (!) 108/53  Pulse:  69 72 71  Resp: 18 18  16   Temp: 97.8 F (36.6 C) 98.1 F (36.7 C) 98 F (36.7 C) 97.7 F (36.5 C)  TempSrc: Oral Oral Oral Oral  SpO2: 100% 100% 100% 98%  Weight:   54.3 kg   Height:        Intake/Output Summary (Last 24 hours) at 09/10/2019 1510 Last data filed at 09/10/2019 0409 Gross per 24 hour  Intake 1108.33 ml  Output -  Net 1108.33 ml   Filed Weights   09/08/19 0327 09/09/19 0400 09/10/19 0400  Weight: 53.7 kg 55.9 kg 54.3 kg    Examination:  General exam: NAD Respiratory system: CTAB anterior lung fields.  No wheezes, no crackles, no rhonchi.  Speaking in full sentences.   Cardiovascular system: RRR with grade 2-3/6 SEM.  No JVD.  No pedal edema. Gastrointestinal system: Abdomen is nontender, nondistended, soft, positive bowel sounds.  No rebound.  No guarding.  Central nervous system: Alert and oriented. No focal neurological deficits. Extremities: Status post left BKA with wound VAC in place.  Status post right transmetatarsal amputation. Skin: No rashes, lesions or ulcers Psychiatry: Judgement and insight appear normal. Mood & affect appropriate.     Data Reviewed: I have personally reviewed following labs and imaging studies  CBC: Recent Labs  Lab 09/05/19 0446 09/06/19 0328 09/07/19 0110 09/09/19 0500 09/10/19 0500  WBC 15.5* 18.0* 19.5* 23.6* 27.5*  NEUTROABS 12.2* 13.5* 14.9* 21.5* 21.6*  HGB 8.3* 8.6* 8.4* 8.2* 8.1*  HCT 26.5* 26.4* 26.5* 26.4* 25.1*  MCV 66.4* 66.0* 65.6* 66.8* 65.5*  PLT 295 288 281 263 599    Basic Metabolic Panel: Recent Labs  Lab 09/05/19 0446 09/06/19 0328 09/07/19 0110 09/09/19 0500 09/10/19  0500  NA 140 140 140 138 137  K 3.2* 3.4* 3.8 3.9 4.3  CL 104 103 103 103 100  CO2 25 26 24 25 23   GLUCOSE 87 88 89 102* 91  BUN 24* 31* 37* 29* 36*  CREATININE 4.87* 6.15* 7.05* 5.42* 6.79*  CALCIUM 8.7* 8.4* 8.2* 7.7* 7.8*  MG  --  2.1  --  1.8 2.4  PHOS 3.9  --   --  3.6 3.8    GFR: Estimated Creatinine Clearance: 7.1 mL/min (A) (by C-G formula based on SCr of 6.79 mg/dL (H)).  Liver Function Tests: Recent Labs  Lab 09/09/19 0500 09/10/19 0500  ALBUMIN 1.6* 1.5*    CBG: Recent Labs  Lab 09/08/19 1117  GLUCAP 105*     Recent Results (from the past 240 hour(s))  Respiratory Panel by RT PCR (Flu A&B, Covid) - Nasopharyngeal Swab     Status: None   Collection Time: 09/03/19  8:18 AM   Specimen: Nasopharyngeal Swab  Result Value Ref Range Status   SARS Coronavirus 2 by RT PCR NEGATIVE NEGATIVE Final    Comment: (NOTE) SARS-CoV-2 target nucleic acids are NOT DETECTED. The SARS-CoV-2 RNA is generally detectable in upper respiratoy specimens during the acute phase of infection. The lowest concentration of SARS-CoV-2 viral copies this assay can detect is 131 copies/mL. A negative result does not preclude SARS-Cov-2 infection and should not be  used as the sole basis for treatment or other patient management decisions. A negative result may occur with  improper specimen collection/handling, submission of specimen other than nasopharyngeal swab, presence of viral mutation(s) within the areas targeted by this assay, and inadequate number of viral copies (<131 copies/mL). A negative result must be combined with clinical observations, patient history, and epidemiological information. The expected result is Negative. Fact Sheet for Patients:  PinkCheek.be Fact Sheet for Healthcare Providers:  GravelBags.it This test is not yet ap proved or cleared by the [place] FDA and  has been authorized for detection  and/or diagnosis of SARS-CoV-2 by FDA under an Emergency Use Authorization (EUA). This EUA will remain  in effect (meaning this test can be used) for the duration of the COVID-19 declaration under Section 564(b)(1) of the Act, 21 U.S.C. section 360bbb-3(b)(1), unless the authorization is terminated or revoked sooner.    Influenza A by PCR NEGATIVE NEGATIVE Final   Influenza B by PCR NEGATIVE NEGATIVE Final    Comment: (NOTE) The Xpert Xpress SARS-CoV-2/FLU/RSV assay is intended as an aid in  the diagnosis of influenza from Nasopharyngeal swab specimens and  should not be used as a sole basis for treatment. Nasal washings and  aspirates are unacceptable for Xpert Xpress SARS-CoV-2/FLU/RSV  testing. Fact Sheet for Patients: PinkCheek.be Fact Sheet for Healthcare Providers: GravelBags.it This test is not yet approved or cleared by the [place] FDA and  has been authorized for detection and/or diagnosis of SARS-CoV-2 by  FDA under an Emergency Use Authorization (EUA). This EUA will remain  in effect (meaning this test can be used) for the duration of the  Covid-19 declaration under Section 564(b)(1) of the Act, 21  U.S.C. section 360bbb-3(b)(1), unless the authorization is  terminated or revoked. Performed at [facility], [place] [address] [place], [place] [address]   MRSA PCR Screening     Status: None   Collection Time: 09/06/19  4:55 AM   Specimen: Nasopharyngeal  Result Value Ref Range Status   MRSA by PCR NEGATIVE NEGATIVE Final    Comment:        The GeneXpert MRSA Assay (FDA approved for NASAL specimens only), is one component of a comprehensive MRSA colonization surveillance program. It is not intended to diagnose MRSA infection nor to guide or monitor treatment for MRSA infections. Performed at [facility], [place] [address] [place], [place] [address]   Surgical pcr screen     Status: None    Collection Time: 09/07/19  7:45 PM   Specimen: Nasal Mucosa; Nasal Swab  Result Value Ref Range Status   MRSA, PCR NEGATIVE NEGATIVE Final   Staphylococcus aureus NEGATIVE NEGATIVE Final    Comment: (NOTE) The Xpert SA Assay (FDA approved for NASAL specimens in patients 45 years of age and older), is one component of a comprehensive surveillance program. It is not intended to diagnose infection nor to guide or monitor treatment. Performed at [facility], [place] [address], [place],  [address]          Radiology Studies: DG CHEST PORT 1 VIEW  Result Date: 09/10/2019 CLINICAL DATA:  Elevated white count. EXAM: PORTABLE CHEST 1 VIEW COMPARISON:  Sep 03, 2019. FINDINGS: Stable cardiomediastinal silhouette. Atherosclerosis of thoracic aorta is noted. Left internal jugular catheter is unchanged in position. No pneumothorax is noted. Right lung is clear. Mild left basilar atelectasis or infiltrate is noted with small left pleural effusion. Bony thorax is unremarkable. IMPRESSION: Aortic  atherosclerosis. Mild left basilar atelectasis or infiltrate is noted with small left pleural effusion. Aortic Atherosclerosis (ICD10-I70.0). Electronically Signed   By: Marijo Conception M.D.   On: 09/10/2019 10:57        Scheduled Meds: . amiodarone  200 mg Oral Daily  . vitamin C  500 mg Oral Q24H  . atorvastatin  40 mg Oral q1800  . B-complex with vitamin C  1 tablet Oral Daily  . chlorhexidine      . Chlorhexidine Gluconate Cloth  6 each Topical Q0600  . cholecalciferol  1,000 Units Oral Daily  . cinacalcet  60 mg Oral QHS  . darbepoetin (ARANESP) injection - DIALYSIS  100 mcg Intravenous Q Tue-HD  . docusate sodium  100 mg Oral BID  . feeding supplement (NEPRO CARB STEADY)  237 mL Oral TID BM  . feeding supplement (PRO-STAT SUGAR FREE 64)  30 mL Oral BID  . heparin sodium (porcine)      . lidocaine      . melatonin  3 mg Oral QHS  . midodrine  10 mg Oral TID WC  . pantoprazole  40  mg Oral BID  . sodium chloride flush  10-40 mL Intracatheter Q12H  . sodium chloride flush  3 mL Intravenous Q12H  . sodium chloride flush  3 mL Intravenous Q12H   Continuous Infusions: . sodium chloride    . sodium chloride    . sodium chloride Stopped (09/04/19 1236)  . ferric gluconate (FERRLECIT/NULECIT) IV 125 mg (09/06/19 2126)     LOS: 7 days    Time spent: 35 minutes    Irine Seal, MD [facility]   To contact the attending provider between 7A-7P or the covering provider during after hours 7P-7A, please log into the web site [email or web address] and access using universal [place] password for that web site. If you do not have the password, please call the hospital operator.  09/10/2019, 3:10 PM

## 2019-09-10 NOTE — Progress Notes (Signed)
[facility] Progress Note   Subjective:   Patient denies any specific complaints today.  Unable to access graft and needs to undergo catheter placement.  We will plan for hemodialysis once catheter is in place.    Objective Vitals:   09/09/19 0400 09/09/19 1513 09/09/19 2000 09/10/19 0400  BP: (!) 106/52 (!) 109/57 (!) 102/55 (!) 103/44  Pulse: 75  69 72  Resp: 15 18 18    Temp: 98.1 F (36.7 C) 97.8 F (36.6 C) 98.1 F (36.7 C) 98 F (36.7 C)  TempSrc: Oral Oral Oral Oral  SpO2: 98% 100% 100% 100%  Weight: 55.9 kg   54.3 kg  Height:       Physical Exam General:chronically ill appearing, frail male in NAD Heart:RRR, +1/6 systolic murmur Lungs:CTAB anteriolaterally, bilateral chest rise Abdomen:soft, NTND Extremities:L AKA with wound vac  Dialysis Access: L thigh AVG +b but as above dressing over AVG  Filed Weights   09/08/19 0327 09/09/19 0400 09/10/19 0400  Weight: 53.7 kg 55.9 kg 54.3 kg    Intake/Output Summary (Last 24 hours) at 09/10/2019 1134 Last data filed at 09/10/2019 0409 Gross per 24 hour  Intake 1108.33 ml  Output --  Net 1108.33 ml    Additional Objective Labs: Basic Metabolic Panel: Recent Labs  Lab 09/05/19 0446 09/06/19 0328 09/07/19 0110 09/09/19 0500 09/10/19 0500  NA 140   < > 140 138 137  K 3.2*   < > 3.8 3.9 4.3  CL 104   < > 103 103 100  CO2 25   < > 24 25 23   GLUCOSE 87   < > 89 102* 91  BUN 24*   < > 37* 29* 36*  CREATININE 4.87*   < > 7.05* 5.42* 6.79*  CALCIUM 8.7*   < > 8.2* 7.7* 7.8*  PHOS 3.9  --   --  3.6 3.8   < > = values in this interval not displayed.   Liver Function Tests: Recent Labs  Lab 09/03/19 1358 09/09/19 0500 09/10/19 0500  AST 16  --   --   ALT 11  --   --   ALKPHOS 65  --   --   BILITOT 1.0  --   --   PROT 6.4*  --   --   ALBUMIN 1.7* 1.6* 1.5*   CBC: Recent Labs  Lab 09/05/19 0446 09/05/19 0446 09/06/19 0328 09/06/19 0328 09/07/19 0110 09/09/19 0500 09/10/19 0500  WBC  15.5*   < > 18.0*   < > 19.5* 23.6* 27.5*  NEUTROABS 12.2*   < > 13.5*   < > 14.9* 21.5* 21.6*  HGB 8.3*   < > 8.6*   < > 8.4* 8.2* 8.1*  HCT 26.5*   < > 26.4*   < > 26.5* 26.4* 25.1*  MCV 66.4*  --  66.0*  --  65.6* 66.8* 65.5*  PLT 295   < > 288   < > 281 263 306   < > = values in this interval not displayed.   Iron Studies:  No results for input(s): IRON, TIBC, TRANSFERRIN, FERRITIN in the last 72 hours. Lab Results  Component Value Date   INR 1.1 12/01/2018   INR 1.03 01/01/2018   INR 1.03 11/27/2017   Studies/Results: DG CHEST PORT 1 VIEW  Result Date: 09/10/2019 CLINICAL DATA:  Elevated white count. EXAM: PORTABLE CHEST 1 VIEW COMPARISON:  Sep 03, 2019. FINDINGS: Stable cardiomediastinal silhouette. Atherosclerosis of thoracic aorta is noted. Left internal  jugular catheter is unchanged in position. No pneumothorax is noted. Right lung is clear. Mild left basilar atelectasis or infiltrate is noted with small left pleural effusion. Bony thorax is unremarkable. IMPRESSION: Aortic atherosclerosis. Mild left basilar atelectasis or infiltrate is noted with small left pleural effusion. Aortic Atherosclerosis (ICD10-I70.0). Electronically Signed   By: Marijo Conception M.D.   On: 09/10/2019 10:57    Medications: . sodium chloride    . sodium chloride    . sodium chloride Stopped (09/04/19 1236)  . ferric gluconate (FERRLECIT/NULECIT) IV 125 mg (09/06/19 2126)   . amiodarone  200 mg Oral Daily  . vitamin C  500 mg Oral Q24H  . atorvastatin  40 mg Oral q1800  . B-complex with vitamin C  1 tablet Oral Daily  . Chlorhexidine Gluconate Cloth  6 each Topical Q0600  . cholecalciferol  1,000 Units Oral Daily  . cinacalcet  60 mg Oral QHS  . darbepoetin (ARANESP) injection - DIALYSIS  100 mcg Intravenous Q Tue-HD  . docusate sodium  100 mg Oral BID  . feeding supplement (NEPRO CARB STEADY)  237 mL Oral TID BM  . feeding supplement (PRO-STAT SUGAR FREE 64)  30 mL Oral BID  . melatonin  3 mg  Oral QHS  . midodrine  10 mg Oral TID WC  . pantoprazole  40 mg Oral BID  . sodium chloride flush  10-40 mL Intracatheter Q12H  . sodium chloride flush  3 mL Intravenous Q12H  . sodium chloride flush  3 mL Intravenous Q12H    Dialysis Orders: [facility] - MWF- 4 hours-  Thigh AVG-  BFR 400/DFR800-  2 K bath as OP-  EDW 62.  Gets heparin per tx-  2000 units per hour, epo 4000 and sensipar 60 mg.  Last hgb there was 4/26  9.9   Assessment/Plan: 1. GIB - EGD w/duodenal bulb ulcer. Gastric bx without malignancy or H. pylori.  PPI per primary team.  Hemoglobin relatively stable at this time 2. 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  Wound VAC in place. 3. Ventricular ectopy - asymptomatic.  On amiodarone orally 4. ESRD- On HD MWF. 4 K bath.  Off schedule last Tx 5/11.  Now has wound vac overlying his AVG and unable to cannulate.  Requested IR to place catheter.  Planning for temporary catheter given leukocytosis and possible infection. 5. Hypotension/volume- Blood pressure soft. Does not appear volume overloaded. On midodrine TID.    6. Anemiaof CKD- Hgb stable in 8s  s/p 1unit pRBC.  Status post iron for low T sat of 11%.  Continue ESA 7. Secondary Hyperparathyroidism -calcium and phosphorus acceptable.  Continue sensipar- does not get vit D with HD-  8. Nutrition- Renal diet w/fluid restrictions  [place] 09/10/2019,11:34 AM  LOS: 7 days

## 2019-09-10 NOTE — Progress Notes (Signed)
Dr. Stanford Breed requested 6-8 week f/u for this pt - Dr. Domenic Polite does not appear to have availability in timeframe needed so scheduled with APP. Appt info placed on AVS. Dayna Dunn PA-C

## 2019-09-10 NOTE — Procedures (Signed)
Interventional Radiology Procedure Note  Procedure: Exchange of left IJ central line for a triple lumen HD catheter.   Complications: None  Estimated Blood Loss: None  Recommendations: - Right IJ is occluded - The left IJ has occluded around the existing triple lumen CVC.  Unable to access for a 2nd line.  - Therefore, the existing line was exchanged over a wire and converted to a temp HD catheter with 3rd lumen.  - If additional IV access is required, pt will require either PIV, or femoral CVC placement.   Signed,  Criselda Peaches, MD

## 2019-09-10 NOTE — TOC Progression Note (Addendum)
Transition of Care (TOC) - Progression Note    Patient Details  Name: Joseph CLIMER Sr. MRN: [id number] Date of Birth: 1940-06-05  Transition of Care [facility]) CM/SW [place], LCSW Phone Number: 09/10/2019, 4:30 PM  Clinical Narrative:    CSW spoke with patient and his daughter, [name]. They are both requesting for patient to go to a different SNF at discharge. CSW explained barriers, including that patient has used a lot of his Medicare days and that he requires transportation to dialysis. CSW sent referral for review. [facility] states they likely have too many dialysis patients at this time but will review referral. [facility] also reviewing and may can accept patient for short term, which patient is in agreement with.    Expected Discharge Plan: Skilled Nursing Facility Barriers to Discharge: Continued Medical Work up  Expected Discharge Plan and Services Expected Discharge Plan: [place] In-house Referral: Clinical Social Work   Post Acute Care Choice: [place] Living arrangements for the past 2 months: [place]                                       Social Determinants of Health (SDOH) Interventions    Readmission Risk Interventions Readmission Risk Prevention Plan 09/10/2019 09/08/2019 09/08/2019  Transportation Screening Complete - Complete  PCP or Specialist Appt within 3-5 Days - - Complete  HRI or [place] - - Complete  Social Work Consult for [place] Planning/Counseling - - Complete  Palliative Care Screening - Complete Not Applicable  Medication Review Press photographer) - - Complete  Some recent data might be hidden

## 2019-09-10 NOTE — Consult Note (Addendum)
Date of Admission:  09/03/2019          Reason for Consult: Leukocytosis    Referring Provider: Dr. Grandville Silos   Assessment:  1. Leukocytosis 2. Recent left above-the-knee amputation for gangrenous involvement of his below the knee amputation site with wound dehiscence 3. 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 Right transmetatarsal amputation site 5. Right middle finger amputation 6. 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 Recent GI bleed 8. Aortic stenosis 9. Peripheral vascular disease   Plan:  1. MRI right foot without contrast 2. Discontinue antibiotics 3. Try to elicit any other findings on interview of the patient and her exam to suggest a cause of his leukocytosis.  I expect much of this may be postoperative reaction to his below the knee amputation he is being followed closely by orthopedic surgery 4. Consider CT chest abdomen and pelvis if still worsening WBC and no cause found  Dr. Linus Salmons will followup on studies, labs and may check in on patient this weekend. Dr. Baxter Flattery will take over the service on Monday.   Principal Problem:   GI bleeding Active Problems:   Encounter for central line placement   ESRD on dialysis [facility])   Aortic stenosis   PVD (peripheral vascular disease) ([place])   Essential hypertension   Duodenal ulcer   Palliative care encounter   DNR (do not resuscitate) discussion   Goals of care, counseling/discussion   Dehiscence of amputation stump ([place])   Chronic hypotension   SVT (supraventricular tachycardia) ([facility])   Ventricular ectopy   Scheduled Meds: . amiodarone  200 mg Oral Daily  . vitamin C  500 mg Oral Q24H  . atorvastatin  40 mg Oral q1800  . B-complex with vitamin C  1 tablet Oral Daily  . Chlorhexidine Gluconate Cloth  6 each Topical Q0600  . cholecalciferol  1,000 Units Oral Daily    . cinacalcet  60 mg Oral QHS  . darbepoetin (ARANESP) injection - DIALYSIS  100 mcg Intravenous Q Tue-HD  . docusate sodium  100 mg Oral BID  . feeding supplement (NEPRO CARB STEADY)  237 mL Oral TID BM  . feeding supplement (PRO-STAT SUGAR FREE 64)  30 mL Oral BID  . melatonin  3 mg Oral QHS  . midodrine  10 mg Oral TID WC  . pantoprazole  40 mg Oral BID  . sodium chloride flush  10-40 mL Intracatheter Q12H  . sodium chloride flush  3 mL Intravenous Q12H  . sodium chloride flush  3 mL Intravenous Q12H   Continuous Infusions: . sodium chloride    . sodium chloride    . sodium chloride Stopped (09/04/19 1236)  . ferric gluconate (FERRLECIT/NULECIT) IV 125 mg (09/06/19 2126)   PRN Meds:.sodium chloride, sodium chloride, sodium chloride, acetaminophen **OR** acetaminophen, albuterol, HYDROmorphone (DILAUDID) injection, metoCLOPramide **OR** metoCLOPramide (REGLAN) injection, ondansetron **OR** ondansetron (ZOFRAN) IV, oxyCODONE, sodium chloride flush, sodium chloride flush  HPI: [name]. is a 79 y.o. male with end-stage renal disease on hemodialysis, severe aortic stenosis peripheral vascular disease who was recently admitted in April with enterococcal bacteremia due to a gangrenous necrotic infection in his foot left foot which required left transtibial amputation.  He underwent a transesophageal echocardiogram during that hospitalization which was negative for endocarditis.  He was ultimately changed to vancomycin which was continued at discharge at skilled nursing facility  where he was to complete antibiotics on the 27th.  After discharge to skilled nurse facility the he began to develop progressive gangrenous changes with wound dehiscence of his below the knee amputation site and was evaluated by Dr. Sharol Given who admitted him to the hospital.  Yesterday he underwent above-the-knee amputation by Dr. Sharol Given.   Note the surgery had to be delayed due to the fact that he had GI bleeding on  admission.  He was found to have a duodenal ulcer which was felt to explain his GI bleeding though he actually had rectal bleeding initially.  The patient has had leukocytosis in the past and certainly during this admission.  Upon arrival his white count was 16,400 and it did rise to 23.6 yesterday which the day of his above-the-knee amputation.  Today it is 27.5 thousand.  I expect his leukemoid reaction is due to severe infection and wound dehiscence that could not be treated initially because of his need for transfusion and work-up by GI.  He just barely had the amputation yesterday I expect it will take a while for his white blood cells to normalize.  That being said he certainly has had infections in the past and I think evaluating his right foot with an MRI is reasonable.     Review of Systems: Review of Systems  Constitutional: Negative for chills, fever, malaise/fatigue and weight loss.  HENT: Negative for congestion and sore throat.   Eyes: Negative for blurred vision and photophobia.  Respiratory: Negative for cough, shortness of breath and wheezing.   Cardiovascular: Negative for chest pain, palpitations and leg swelling.  Gastrointestinal: Negative for abdominal pain, blood in stool, constipation, diarrhea, heartburn, melena, nausea and vomiting.  Genitourinary: Negative for dysuria, flank pain and hematuria.  Musculoskeletal: Positive for myalgias. Negative for back pain, falls and joint pain.  Skin: Negative for itching and rash.  Neurological: Negative for dizziness, focal weakness, loss of consciousness, weakness and headaches.  Endo/Heme/Allergies: Does not bruise/bleed easily.  Psychiatric/Behavioral: Negative for hallucinations. The patient is not nervous/anxious.     Past Medical History:  Diagnosis Date  . Acute osteomyelitis of left foot ([place]) 07/21/2019  . Anxiety   . Aortic stenosis   . Arthritis   . Closed avulsion fracture of right hip ([place]) 06/30/2019  . ESRD  (end stage renal disease) on dialysis [facility])    M/W/F at [facility] in [place]  . Essential hypertension   . Gangrene of left foot ([place]) 06/04/2018  . Gangrene of right foot ([place])   . Gastric ulcer 2004  . GI bleed   . GI bleeding 08/2019  . History of blood transfusion   . History of cardiomyopathy    LVEF normal as of February 2017  . History of gastric ulcer   . History of stroke    Left side weakness  . Iron deficiency anemia   . Osteomyelitis ([place])   . Peripheral vascular disease ([place])     Social History   Tobacco Use  . Smoking status: Former Smoker    Years: 25.00    Quit date: 04/29/2004    Years since quitting: 15.3  . Smokeless tobacco: Former Systems developer    Types: Chew    Quit date: 01/16/1987  . Tobacco comment: quit 2006  Substance Use Topics  . Alcohol use: No  . Drug use: No    Family History  Problem Relation Age of Onset  . Hypertension Mother   . Colon cancer Neg Hx   . Liver  disease Neg Hx    Allergies  Allergen Reactions  . Aspirin Other (See Comments)    Causes internal bleeding  History of ulcers    OBJECTIVE: Blood pressure (!) 103/44, pulse 72, temperature 98 F (36.7 C), temperature source Oral, resp. rate 18, height 5' 5.98" (1.676 m), weight 54.3 kg, SpO2 100 %.  Physical Exam Constitutional:      Appearance: He is well-developed.  HENT:     Head: Normocephalic and atraumatic.  Eyes:     Conjunctiva/sclera: Conjunctivae normal.  Cardiovascular:     Rate and Rhythm: Normal rate and regular rhythm.     Heart sounds: No murmur. No friction rub. No gallop.   Pulmonary:     Effort: Pulmonary effort is normal. No respiratory distress.     Breath sounds: Normal breath sounds. No stridor. No wheezing or rhonchi.  Abdominal:     General: Abdomen is flat. There is no distension.     Palpations: Abdomen is soft.  Musculoskeletal:        General: No tenderness. Normal range of motion.     Cervical back: Normal range of motion and neck supple.    Skin:    General: Skin is warm and dry.     Coloration: Skin is not pale.     Findings: No erythema or rash.  Neurological:     General: No focal deficit present.     Mental Status: He is alert.  Psychiatric:        Mood and Affect: Mood normal.        Speech: Speech is delayed.   Left AKA site with vacuum dressing in place  Right transmetatarsal amputation site Sep 10, 2019:      Lab Results Lab Results  Component Value Date   WBC 27.5 (H) 09/10/2019   HGB 8.1 (L) 09/10/2019   HCT 25.1 (L) 09/10/2019   MCV 65.5 (L) 09/10/2019   PLT 306 09/10/2019    Lab Results  Component Value Date   CREATININE 6.79 (H) 09/10/2019   BUN 36 (H) 09/10/2019   NA 137 09/10/2019   K 4.3 09/10/2019   CL 100 09/10/2019   CO2 23 09/10/2019    Lab Results  Component Value Date   ALT 11 09/03/2019   AST 16 09/03/2019   ALKPHOS 65 09/03/2019   BILITOT 1.0 09/03/2019     Microbiology: Recent Results (from the past 240 hour(s))  Respiratory Panel by RT PCR (Flu A&B, Covid) - Nasopharyngeal Swab     Status: None   Collection Time: 09/03/19  8:18 AM   Specimen: Nasopharyngeal Swab  Result Value Ref Range Status   SARS Coronavirus 2 by RT PCR NEGATIVE NEGATIVE Final    Comment: (NOTE) SARS-CoV-2 target nucleic acids are NOT DETECTED. The SARS-CoV-2 RNA is generally detectable in upper respiratoy specimens during the acute phase of infection. The lowest concentration of SARS-CoV-2 viral copies this assay can detect is 131 copies/mL. A negative result does not preclude SARS-Cov-2 infection and should not be used as the sole basis for treatment or other patient management decisions. A negative result may occur with  improper specimen collection/handling, submission of specimen other than nasopharyngeal swab, presence of viral mutation(s) within the areas targeted by this assay, and inadequate number of viral copies (<131 copies/mL). A negative result must be combined with  clinical observations, patient history, and epidemiological information. The expected result is Negative. Fact Sheet for Patients:  PinkCheek.be Fact Sheet for Healthcare Providers:  GravelBags.it This  test is not yet ap proved or cleared by the [place] and  has been authorized for detection and/or diagnosis of SARS-CoV-2 by FDA under an Emergency Use Authorization (EUA). This EUA will remain  in effect (meaning this test can be used) for the duration of the COVID-19 declaration under Section 564(b)(1) of the Act, 21 U.S.C. section 360bbb-3(b)(1), unless the authorization is terminated or revoked sooner.    Influenza A by PCR NEGATIVE NEGATIVE Final   Influenza B by PCR NEGATIVE NEGATIVE Final    Comment: (NOTE) The Xpert Xpress SARS-CoV-2/FLU/RSV assay is intended as an aid in  the diagnosis of influenza from Nasopharyngeal swab specimens and  should not be used as a sole basis for treatment. Nasal washings and  aspirates are unacceptable for Xpert Xpress SARS-CoV-2/FLU/RSV  testing. Fact Sheet for Patients: PinkCheek.be Fact Sheet for Healthcare Providers: GravelBags.it This test is not yet approved or cleared by the [place] FDA and  has been authorized for detection and/or diagnosis of SARS-CoV-2 by  FDA under an Emergency Use Authorization (EUA). This EUA will remain  in effect (meaning this test can be used) for the duration of the  Covid-19 declaration under Section 564(b)(1) of the Act, 21  U.S.C. section 360bbb-3(b)(1), unless the authorization is  terminated or revoked. Performed at [facility], [place] [address], [place], [place] [address]   MRSA PCR Screening     Status: None   Collection Time: 09/06/19  4:55 AM   Specimen: Nasopharyngeal  Result Value Ref Range Status   MRSA by PCR NEGATIVE NEGATIVE Final    Comment:        The  GeneXpert MRSA Assay (FDA approved for NASAL specimens only), is one component of a comprehensive MRSA colonization surveillance program. It is not intended to diagnose MRSA infection nor to guide or monitor treatment for MRSA infections. Performed at [facility], [place] [address] [place], [place] [address]   Surgical pcr screen     Status: None   Collection Time: 09/07/19  7:45 PM   Specimen: Nasal Mucosa; Nasal Swab  Result Value Ref Range Status   MRSA, PCR NEGATIVE NEGATIVE Final   Staphylococcus aureus NEGATIVE NEGATIVE Final    Comment: (NOTE) The Xpert SA Assay (FDA approved for NASAL specimens in patients 51 years of age and older), is one component of a comprehensive surveillance program. It is not intended to diagnose infection nor to guide or monitor treatment. Performed at [facility], [place] [address], [place], [place] [address]     Alcide Evener, [facility] [phone] pager  09/10/2019, 10:38 AM

## 2019-09-11 LAB — RENAL FUNCTION PANEL
Albumin: 1.5 g/dL — ABNORMAL LOW (ref 3.5–5.0)
Anion gap: 9 (ref 5–15)
BUN: 14 mg/dL (ref 8–23)
CO2: 28 mmol/L (ref 22–32)
Calcium: 7.4 mg/dL — ABNORMAL LOW (ref 8.9–10.3)
Chloride: 99 mmol/L (ref 98–111)
Creatinine, Ser: 3.36 mg/dL — ABNORMAL HIGH (ref 0.61–1.24)
GFR calc Af Amer: 19 mL/min — ABNORMAL LOW (ref >60)
GFR calc non Af Amer: 17 mL/min — ABNORMAL LOW (ref >60)
Glucose, Bld: 116 mg/dL — ABNORMAL HIGH (ref 70–99)
Phosphorus: 2.4 mg/dL — ABNORMAL LOW (ref 2.5–4.6)
Potassium: 3.7 mmol/L (ref 3.5–5.1)
Sodium: 136 mmol/L (ref 135–145)

## 2019-09-11 LAB — CBC WITH DIFFERENTIAL/PLATELET
Abs Immature Granulocytes: 0.17 10*3/uL — ABNORMAL HIGH (ref 0.00–0.07)
Basophils Absolute: 0 10*3/uL (ref 0.0–0.1)
Basophils Relative: 0 %
Eosinophils Absolute: 0.4 10*3/uL (ref 0.0–0.5)
Eosinophils Relative: 2 %
HCT: 24.9 % — ABNORMAL LOW (ref 39.0–52.0)
Hemoglobin: 8 g/dL — ABNORMAL LOW (ref 13.0–17.0)
Immature Granulocytes: 1 %
Lymphocytes Relative: 8 %
Lymphs Abs: 1.7 10*3/uL (ref 0.7–4.0)
MCH: 20.7 pg — ABNORMAL LOW (ref 26.0–34.0)
MCHC: 32.1 g/dL (ref 30.0–36.0)
MCV: 64.5 fL — ABNORMAL LOW (ref 80.0–100.0)
Monocytes Absolute: 1 10*3/uL (ref 0.1–1.0)
Monocytes Relative: 5 %
Neutro Abs: 18.6 10*3/uL — ABNORMAL HIGH (ref 1.7–7.7)
Neutrophils Relative %: 84 %
Platelets: 302 10*3/uL (ref 150–400)
RBC: 3.86 MIL/uL — ABNORMAL LOW (ref 4.22–5.81)
RDW: 23.6 % — ABNORMAL HIGH (ref 11.5–15.5)
WBC: 21.9 10*3/uL — ABNORMAL HIGH (ref 4.0–10.5)
nRBC: 0.2 % (ref 0.0–0.2)

## 2019-09-11 MED ORDER — HEPARIN SODIUM (PORCINE) 1000 UNIT/ML IJ SOLN
INTRAMUSCULAR | Status: AC
  Filled 2019-09-11: qty 4

## 2019-09-11 NOTE — Progress Notes (Signed)
MRI reviewed and no significant concerns from infection standpoint.  Leukocytosis improving as expected post op.   I will sign off, call with any questions.  Thayer Headings, MD

## 2019-09-11 NOTE — Progress Notes (Signed)
[facility] Progress Note   Subjective:   Patient underwent temporary catheter placement without significant issues.  Tolerated dialysis with 1.6 L of ultrafiltration.  Otherwise no complaints.  Objective Vitals:   09/11/19 0200 09/11/19 0250 09/11/19 0440 09/11/19 0645  BP: 93/63 (!) 170/50 (!) 145/59   Pulse: 78 68 79   Resp:  14    Temp:  98 F (36.7 C) 98 F (36.7 C)   TempSrc:  Oral    SpO2:  93%    Weight:    53.9 kg  Height:       Physical Exam General:chronically ill appearing, frail male in NAD Heart:RRR, +3/4 systolic murmur Lungs:CTA anteriolaterally, bilateral chest rise Abdomen:soft, NTND Extremities:L AKA with wound vac  Dialysis Access: L thigh AVG +b but as above dressing over AVG  Filed Weights   09/09/19 0400 09/10/19 0400 09/11/19 0645  Weight: 55.9 kg 54.3 kg 53.9 kg    Intake/Output Summary (Last 24 hours) at 09/11/2019 1140 Last data filed at 09/11/2019 0950 Gross per 24 hour  Intake 10 ml  Output 1640 ml  Net -1630 ml    Additional Objective Labs: Basic Metabolic Panel: Recent Labs  Lab 09/09/19 0500 09/10/19 0500 09/11/19 0752  NA 138 137 136  K 3.9 4.3 3.7  CL 103 100 99  CO2 25 23 28   GLUCOSE 102* 91 116*  BUN 29* 36* 14  CREATININE 5.42* 6.79* 3.36*  CALCIUM 7.7* 7.8* 7.4*  PHOS 3.6 3.8 2.4*   Liver Function Tests: Recent Labs  Lab 09/09/19 0500 09/10/19 0500 09/11/19 0752  ALBUMIN 1.6* 1.5* 1.5*   CBC: Recent Labs  Lab 09/06/19 0328 09/06/19 0328 09/07/19 0110 09/07/19 0110 09/09/19 0500 09/10/19 0500 09/11/19 0752  WBC 18.0*   < > 19.5*   < > 23.6* 27.5* 21.9*  NEUTROABS 13.5*   < > 14.9*   < > 21.5* 21.6* 18.6*  HGB 8.6*   < > 8.4*   < > 8.2* 8.1* 8.0*  HCT 26.4*   < > 26.5*   < > 26.4* 25.1* 24.9*  MCV 66.0*  --  65.6*  --  66.8* 65.5* 64.5*  PLT 288   < > 281   < > 263 306 302   < > = values in this interval not displayed.   Iron Studies:  No results for input(s): IRON, TIBC,  TRANSFERRIN, FERRITIN in the last 72 hours. Lab Results  Component Value Date   INR 1.1 12/01/2018   INR 1.03 01/01/2018   INR 1.03 11/27/2017   Studies/Results: MR FOOT RIGHT WO CONTRAST  Result Date: 09/10/2019 CLINICAL DATA:  Fever elevated WBC infection of the right forefoot EXAM: MRI OF THE RIGHT FOREFOOT WITHOUT CONTRAST TECHNIQUE: Multiplanar, multisequence MR imaging of the right was performed. No intravenous contrast was administered. COMPARISON:  None. 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 There is mildly increased T2 hyperintense signal seen within the proximal third metatarsal shaft with a minimally increased T1 hypointense signal. There is also mildly increased marrow signal seen at the amputation site of the first metatarsal, however no T1 hypointensity. Ligaments The Lisfranc ligaments are intact. Muscles and Tendons Mild fatty atrophy noted within the muscles surrounding the forefoot. The visualized portions of the extensor and flexor tendons are intact. Soft tissues Mild skin irregularity with superficial ulceration and subcutaneous edema seen overlying the third metatarsal amputation site. However no sinus tract or loculated fluid collection. IMPRESSION: Mild overlying  skin irregularity and edema seen at the third and fourth metatarsal amputation sites with intramedullary signal changes in the proximal third metatarsal shaft which could be due to mild osteomyelitis or reactive marrow. No soft tissue abscess or sinus tract. Probable reactive marrow within the amputation site of the first metatarsal. Electronically Signed   By: Prudencio Pair M.D.   On: 09/10/2019 20:55   IR Fluoro Guide CV Line Left  Result Date: 09/10/2019 INDICATION: 79 year old male with end-stage renal disease on hemodialysis. 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 He requires a  temporary catheter for hemodialysis. Ultrasound evaluation of the right internal jugular vein demonstrates an occluded IJ. On the left, a central venous catheter is currently present. The left internal jugular vein narrows around the catheter and may also be occluded. EXAM: 1. Ultrasound-guided puncture left internal jugular vein 2. Catheter exchange MEDICATIONS: None. ANESTHESIA/SEDATION: None FLUOROSCOPY TIME:  Fluoroscopy Time: 6 minutes 12 seconds (25 mGy). COMPLICATIONS: None immediate. PROCEDURE: Informed written consent was obtained from the patient after a thorough discussion of the procedural risks, benefits and alternatives. All questions were addressed. Maximal Sterile Barrier Technique was utilized including caps, mask, sterile gowns, sterile gloves, sterile drape, hand hygiene and skin antiseptic. A timeout was performed prior to the initiation of the procedure. The left internal jugular vein was interrogated with ultrasound and found to be abnormal. A central venous catheter is present within the vessel. More superiorly in the neck, the vessel remains patent although the wall is thickened. However, as ultrasound was used to track more distally along the vessel, the vessel becomes narrowed surrounding the central venous catheter concerning for occlusion. As the patient has very poor access options, the decision was made to attempt puncture of the patent portion of the IJ to see of a wire would pass centrally. An image was obtained and stored for the medical record. Local anesthesia was attained by infiltration with 1% lidocaine. Under real-time sonographic guidance, the vessel was punctured with a 21 gauge micropuncture needle. Unfortunately, a wire could not be passed into the central veins. At this time, the decision was made to proceed with exchange of the existing central venous catheter for a temporary hemodialysis catheter with an additional third lumen for venous access. The retention sutures  were cut of the existing triple-lumen catheter. The hub was cut off the catheter. A stiff glidewire was advanced through the central lumen. Fluoroscopically, the tip of the existing catheter was actually within the azygos vein. The catheter was pulled back in till the tip was in the upper SVC. The stiff Glidewire was then successfully advanced through the heart and into the inferior vena cava. The central venous catheter was removed over the wire. The skin tract was dilated and a 20 cm triple-lumen non tunneled hemodialysis catheter was advanced over the wire and position with the tip at the superior cavoatrial junction. The catheter flushes and aspirates easily. The catheter was flushed with heparinized saline and secured to the skin with 0 Prolene suture. A sterile bandage was applied. IMPRESSION: 1. Occluded right internal jugular vein. 2. The left internal jugular vein is also occluded surrounding the existing central venous catheter. 3. A new catheter could not be placed, therefore the existing triple-lumen catheter was exchanged over a wire for a triple-lumen temporary hemodialysis catheter. Signed, Criselda Peaches, MD, [facility] Electronically Signed   By: Dellis Filbert.D.  On: 09/10/2019 15:25   IR US Guide Vasc Access Left  Result Date: 09/10/2019 INDICATION: 79 year old male with end-stage renal disease on hemodialysis. 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 He requires a temporary catheter for hemodialysis. Ultrasound evaluation of the right internal jugular vein demonstrates an occluded IJ. On the left, a central venous catheter is currently present. The left internal jugular vein narrows around the catheter and may also be occluded. EXAM: 1. Ultrasound-guided puncture left internal jugular vein 2. Catheter exchange MEDICATIONS: None. ANESTHESIA/SEDATION: None  FLUOROSCOPY TIME:  Fluoroscopy Time: 6 minutes 12 seconds (25 mGy). COMPLICATIONS: None immediate. PROCEDURE: Informed written consent was obtained from the patient after a thorough discussion of the procedural risks, benefits and alternatives. All questions were addressed. Maximal Sterile Barrier Technique was utilized including caps, mask, sterile gowns, sterile gloves, sterile drape, hand hygiene and skin antiseptic. A timeout was performed prior to the initiation of the procedure. The left internal jugular vein was interrogated with ultrasound and found to be abnormal. A central venous catheter is present within the vessel. More superiorly in the neck, the vessel remains patent although the wall is thickened. However, as ultrasound was used to track more distally along the vessel, the vessel becomes narrowed surrounding the central venous catheter concerning for occlusion. As the patient has very poor access options, the decision was made to attempt puncture of the patent portion of the IJ to see of a wire would pass centrally. An image was obtained and stored for the medical record. Local anesthesia was attained by infiltration with 1% lidocaine. Under real-time sonographic guidance, the vessel was punctured with a 21 gauge micropuncture needle. Unfortunately, a wire could not be passed into the central veins. At this time, the decision was made to proceed with exchange of the existing central venous catheter for a temporary hemodialysis catheter with an additional third lumen for venous access. The retention sutures were cut of the existing triple-lumen catheter. The hub was cut off the catheter. A stiff glidewire was advanced through the central lumen. Fluoroscopically, the tip of the existing catheter was actually within the azygos vein. The catheter was pulled back in till the tip was in the upper SVC. The stiff Glidewire was then successfully advanced through the heart and into the inferior vena cava. The  central venous catheter was removed over the wire. The skin tract was dilated and a 20 cm triple-lumen non tunneled hemodialysis catheter was advanced over the wire and position with the tip at the superior cavoatrial junction. The catheter flushes and aspirates easily. The catheter was flushed with heparinized saline and secured to the skin with 0 Prolene suture. A sterile bandage was applied. IMPRESSION: 1. Occluded right internal jugular vein. 2. The left internal jugular vein is also occluded surrounding the existing central venous catheter. 3. A new catheter could not be placed, therefore the existing triple-lumen catheter was exchanged over a wire for a triple-lumen temporary hemodialysis catheter. Signed, Criselda Peaches, MD, [facility] Electronically Signed   By: Jacqulynn Cadet M.D.   On: 09/10/2019 15:25   DG CHEST PORT 1 VIEW  Result Date: 09/10/2019 CLINICAL DATA:  Elevated white count. EXAM: PORTABLE CHEST 1 VIEW COMPARISON:  Sep 03, 2019. FINDINGS: Stable cardiomediastinal silhouette. Atherosclerosis of thoracic aorta is noted. Left internal jugular catheter is unchanged in position. No pneumothorax is noted. Right lung is clear. Mild left basilar atelectasis or  infiltrate is noted with small left pleural effusion. Bony thorax is unremarkable. IMPRESSION: Aortic atherosclerosis. Mild left basilar atelectasis or infiltrate is noted with small left pleural effusion. Aortic Atherosclerosis (ICD10-I70.0). Electronically Signed   By: Marijo Conception M.D.   On: 09/10/2019 10:57    Medications: . sodium chloride    . sodium chloride    . sodium chloride Stopped (09/04/19 1236)  . ferric gluconate (FERRLECIT/NULECIT) IV 125 mg (09/06/19 2126)   . amiodarone  200 mg Oral Daily  . vitamin C  500 mg Oral Q24H  . atorvastatin  40 mg Oral q1800  . B-complex with vitamin C  1 tablet Oral Daily  . Chlorhexidine Gluconate Cloth  6  each Topical Q0600  . cholecalciferol  1,000 Units Oral Daily  . cinacalcet  60 mg Oral QHS  . darbepoetin (ARANESP) injection - DIALYSIS  100 mcg Intravenous Q Tue-HD  . docusate sodium  100 mg Oral BID  . feeding supplement (NEPRO CARB STEADY)  237 mL Oral TID BM  . feeding supplement (PRO-STAT SUGAR FREE 64)  30 mL Oral BID  . heparin sodium (porcine)      . melatonin  3 mg Oral QHS  . midodrine  10 mg Oral TID WC  . pantoprazole  40 mg Oral BID  . sodium chloride flush  10-40 mL Intracatheter Q12H  . sodium chloride flush  3 mL Intravenous Q12H  . sodium chloride flush  3 mL Intravenous Q12H    Dialysis Orders: [facility] - MWF- 4 hours-  Thigh AVG-  BFR 400/DFR800-  2 K bath as OP-  EDW 62.  Gets heparin per tx-  2000 units per hour, epo 4000 and sensipar 60 mg.  Last hgb there was 4/26  9.9   Assessment/Plan: 1. GIB - EGD w/duodenal bulb ulcer. Gastric bx without malignancy or H. pylori.  PPI per primary team.  Hemoglobin relatively stable at this time 2. 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  Wound VAC in place. 3. Ventricular ectopy - asymptomatic.  On amiodarone orally 4. ESRD- On HD MWF.  Now has wound vac overlying his AVG and unable to cannulate.  Status post temporary catheter.  Temporary because of leukocytosis but will need permanent catheter before discharge.  Tolerated dialysis yesterday.  Will repeat dialysis on Monday. 5. Hypotension/volume-no signs of significant volume overload on midodrine TID.    6. Anemiaof CKD- Hgb stable in 8s  s/p 1unit pRBC.  Status post iron for low T sat of 11%.  Continue ESA  7. Secondary Hyperparathyroidism -calcium and phosphorus acceptable.  Continue sensipar- does not get vit D with HD-phosphorus slightly low today at 2.4.  Continue to monitor 8. Nutrition- Renal diet w/fluid restrictions  [place] 09/11/2019,11:40 AM  LOS: 8 days

## 2019-09-11 NOTE — Progress Notes (Signed)
PROGRESS NOTE    Linville Decarolis  DXI:[id number] DOB: [birth year] DOA: 09/03/2019 PCP: [name], MD    No chief complaint on file.   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 However in the preoperative area, patient noted to have rectal bleeding for which [facility] was consulted and advised that the procedure will need to be postponed.  Patient was subsequently admitted.    Assessment & Plan:   Principal Problem:   GI bleeding Active Problems:   Encounter for central line placement   ESRD on dialysis [facility])   Aortic stenosis   PVD (peripheral vascular disease) ([place])   Essential hypertension   Duodenal ulcer   Palliative care encounter   DNR (do not resuscitate) discussion   Goals of care, counseling/discussion   Dehiscence of amputation stump ([place])   Chronic hypotension   SVT (supraventricular tachycardia) ([facility])   Ventricular ectopy  1 upper GI bleed secondary to duodenal ulcer/acute blood loss anemia Patient noted to have some rectal bleeding in the preop area prior to surgery and subsequently admitted.  Patient transfused a unit of packed red  blood cells 09/04/2019.  Plavix currently on hold.  Status post upper endoscopy 09/04/2019 that showed duodenal ulcer which was likely cause of patient's bleeding per GI.  Colonoscopy subsequently was not performed as patient also had a poor prep.  H&H stable at 8.1.  Continue PPI every 12 hours.  Continue to hold Plavix for now and could likely resume Plavix once permanent catheter has been placed.  Follow.   2.  End-stage renal disease on hemodialysis Patient noted per nephrology that dressing from wound VAC currently over patient's AV graft.  Per nephrology thigh graft not accessible for dialysis and assess patient will need a tunneled dialysis catheter.  Patient seen by IR and underwent exchange of left IJ central line for triple-lumen HD catheter by Dr. Laurence Ferrari.  Patient had hemodialysis last night.  Per nephrology patient will need permanent access prior to discharge.  Per nephrology.    3.  Progressive gangrenous necrotic changes with stump dehiscence of left transtibial amputation site Patient seen by orthopedics.  Patient subsequently underwent left AKA today(09/08/2019) without any complications.  Currently on Prevena boot.  Per orthopedics.  4.  Leukocytosis Likely secondary to problem #3.  Worsening leukocytosis.  Status post left AKA.  Patient started on IV Zyvox.  Repeat blood cultures pending with no growth to date.  Due to worsening leukocytosis ID consulted who recommended discontinuation of antibiotics, MRI of the right foot, and consideration for CT chest abdomen and pelvis if worsening WBC and no cause found.  Leukocytosis trending down.  Antibiotics have been discontinued per ID.  ID following.  Follow.   5.  Chronic diastolic heart failure Compensated.  On hemodialysis.  Per nephrology.  6.  Chronic hypotension Midodrine.   7. SVT Patient noted to have a run of SVT with  heart rates as high as the 200s in the PACU postoperatively on 09/08/2019.  Patient received a dose of IV  metoprolol and SVT broken and heart rate in the 70s.  Patient started on amiodarone per cardiology for ventricular ectopy.  Amiodarone dose increased to 200 mg daily per cardiology.  Follow.  8.  Ventricular ectopy Patient started on amiodarone per cardiology.  Amiodarone dose increased to 200 mg daily per cardiology.  Outpatient follow-up with cardiology.  Follow.  9.  Severe Aortic stenosis Noted on TEE during previous admission.  Try to avoid hypotension.  Not a candidate for TAVR.  Cardiology following.  10.  Prolonged QTC Repeat EKG pending.  Keep magnesium > 2.  Keep potassium greater > 4.  11.  Iron deficiency anemia/anemia of chronic disease H&H stable at 8.  Follow.  12.  History of CVA Plavix currently on hold.  Orthopedics to advise when Plavix may be resumed.  Could potentially likely resume Plavix permanent catheter has been placed before discharge.    13.  Generalized deconditioning Patient overall with a poor prognosis.  Palliative care has assessed and followed patient.  Patient now with partial code.  Patient wishes to have CPR and ACLS medication but no intubation or shock.  PT/OT.  Needs SNF.     DVT prophylaxis: Recent GI bleed.  Avoiding chemical prophylaxis.  SCDs. Code Status: Partial Family Communication: Updated patient and daughter [name] at bedside. Disposition:   Status is: Inpatient    Dispo: The patient is from: SNF              Anticipated d/c is to: SNF              Anticipated d/c date is: Hopefully on Monday, 09/13/2019              Patient currently not medically stable for discharge.  Patient with a worsening leukocytosis post amputation.  On IV antibiotics.  Needs tunneled dialysis access catheter as dressing over thigh AV graft per nephrology which was done today 09/10/2019.        Consultants:   Gastroenterology: Dr. Tarri Glenn 09/03/2019  Nephrology: Dr.Schertz 09/03/2019  Palliative care: Lindell Spar, NP 09/04/2019  Cardiology: Dr. Lovena Le  09/04/2019  Domenic Moras, FNP 09/07/2019  Orthopedics: Dr. Sharol Given  ID: Dr. Tommy Medal 09/10/2019  Procedures:   Chest x-ray 09/03/2019  Upper endoscopy 09/04/2019--per Dr. Tarri Glenn  Left above-the-knee amputation per Dr. Sharol Given 09/08/2019 EGD on 09/04/2019 Impression: - Duodenal ulcer may be the cause of recent overt  GI bleeding. 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 Biopsied. - Erosive gastropathy with no bleeding and no  stigmata of recent bleeding. Biopsied. - A single non-bleeding angioectasia in the  duodenum. - Small hiatal hernia. - Unable to proceed with colonoscopy as planned due  to incomplete preparation. Patient had formed stool  on last bowel movement. Recommendation: - Return patient to hospital ward for ongoing care. - Clear liquid diet. Advance as tolerated. - No aspirin, ibuprofen, naproxen, or other  non-steroidal anti-inflammatory drugs. - Patient has a prolonged QT. Discussed with Dr.  Starla Link who agreed with trial of pantoprazole 40 mg  IV BID with close monitoring of QTc. - Will defer colonoscopy at this time given the  challenges that the patient has prepping for the  procedure. Duodenal ulcer may be the source of  bleeding. - Continue serial hgb/hct with transfusion as  indicated.  Exchange of left IJ central line for triple-lumen HD catheter--per IR Dr. Laurence Ferrari 09/10/2019  MRI  right foot 09/10/2019  Antimicrobials:   IV Ancef 09/08/2019 preoperatively  IV Zyvox 09/08/2019>>>>> 09/10/2019   Subjective: Patient laying in bed.  Denies chest pain.  No shortness of breath.  Feeling better.  Daughter at bedside.  Objective: Vitals:   09/11/19 0200 09/11/19 0250 09/11/19 0440 09/11/19 0645  BP: 93/63 (!) 170/50 (!) 145/59   Pulse: 78 68 79   Resp:  14    Temp:  98 F (36.7 C) 98 F (36.7 C)   TempSrc:  Oral    SpO2:  93%    Weight:    53.9 kg  Height:        Intake/Output Summary (Last 24 hours) at 09/11/2019 1152 Last data filed at 09/11/2019 0950 Gross per 24 hour  Intake 10 ml  Output 1640 ml  Net -1630 ml   Filed Weights   09/09/19 0400 09/10/19 0400 09/11/19 0645  Weight: 55.9 kg 54.3 kg 53.9 kg    Examination:  General exam: NAD Respiratory system: Lungs clear to auscultation bilaterally anterior lung fields.  No wheezes, no crackles, no rhonchi.  Speaking in full sentences.  Cardiovascular system: RRR with grade 2-3/6 SEM.  No JVD.  No pedal edema. Gastrointestinal system: Abdomen is nontender, nondistended, soft, positive bowel sounds.  No rebound.  No guarding.  Central nervous system: Alert and oriented. No focal neurological deficits. Extremities: Status post left BKA with wound VAC in place.  Status post right transmetatarsal amputation. Skin: No rashes, lesions or ulcers Psychiatry: Judgement and insight appear normal. Mood & affect appropriate.     Data Reviewed: I have personally reviewed following labs and imaging studies  CBC: Recent Labs  Lab 09/06/19 0328 09/07/19 0110 09/09/19 0500 09/10/19 0500 09/11/19 0752  WBC 18.0* 19.5* 23.6* 27.5* 21.9*  NEUTROABS 13.5* 14.9* 21.5* 21.6* 18.6*  HGB 8.6* 8.4* 8.2* 8.1* 8.0*  HCT 26.4* 26.5* 26.4* 25.1* 24.9*  MCV 66.0* 65.6* 66.8* 65.5* 64.5*  PLT 288 281 263 306 810    Basic Metabolic Panel: Recent Labs  Lab 09/05/19 0446 09/05/19 0446 09/06/19 0328 09/07/19 0110  09/09/19 0500 09/10/19 0500 09/11/19 0752  NA 140   < > 140 140 138 137 136  K 3.2*   < > 3.4* 3.8 3.9 4.3 3.7  CL 104   < > 103 103 103 100 99  CO2 25   < > 26 24 25 23 28   GLUCOSE 87   < > 88 89 102* 91 116*  BUN 24*   < > 31* 37* 29* 36* 14  CREATININE 4.87*   < > 6.15* 7.05* 5.42* 6.79* 3.36*  CALCIUM 8.7*   < > 8.4* 8.2* 7.7* 7.8* 7.4*  MG  --   --  2.1  --  1.8 2.4  --   PHOS 3.9  --   --   --  3.6 3.8 2.4*   < > = values in this interval not displayed.    GFR: Estimated Creatinine Clearance: 14.3 mL/min (A) (by C-G formula based on SCr of 3.36 mg/dL (H)).  Liver Function Tests: Recent Labs  Lab 09/09/19 0500 09/10/19 0500 09/11/19 0752  ALBUMIN 1.6* 1.5* 1.5*    CBG: Recent Labs  Lab 09/08/19 1117  GLUCAP 105*     Recent Results (from the past 240 hour(s))  Respiratory Panel by RT PCR (Flu A&B, Covid) - Nasopharyngeal Swab     Status: None   Collection Time: 09/03/19  8:18 AM   Specimen: Nasopharyngeal Swab  Result Value  Ref Range Status   SARS Coronavirus 2 by RT PCR NEGATIVE NEGATIVE Final    Comment: (NOTE) SARS-CoV-2 target nucleic acids are NOT DETECTED. The SARS-CoV-2 RNA is generally detectable in upper respiratoy specimens during the acute phase of infection. The lowest concentration of SARS-CoV-2 viral copies this assay can detect is 131 copies/mL. A negative result does not preclude SARS-Cov-2 infection and should not be used as the sole basis for treatment or other patient management decisions. A negative result may occur with  improper specimen collection/handling, submission of specimen other than nasopharyngeal swab, presence of viral mutation(s) within the areas targeted by this assay, and inadequate number of viral copies (<131 copies/mL). A negative result must be combined with clinical observations, patient history, and epidemiological information. The expected result is Negative. Fact Sheet for Patients:    PinkCheek.be Fact Sheet for Healthcare Providers:  GravelBags.it This test is not yet ap proved or cleared by the [place] FDA and  has been authorized for detection and/or diagnosis of SARS-CoV-2 by FDA under an Emergency Use Authorization (EUA). This EUA will remain  in effect (meaning this test can be used) for the duration of the COVID-19 declaration under Section 564(b)(1) of the Act, 21 U.S.C. section 360bbb-3(b)(1), unless the authorization is terminated or revoked sooner.    Influenza A by PCR NEGATIVE NEGATIVE Final   Influenza B by PCR NEGATIVE NEGATIVE Final    Comment: (NOTE) The Xpert Xpress SARS-CoV-2/FLU/RSV assay is intended as an aid in  the diagnosis of influenza from Nasopharyngeal swab specimens and  should not be used as a sole basis for treatment. Nasal washings and  aspirates are unacceptable for Xpert Xpress SARS-CoV-2/FLU/RSV  testing. Fact Sheet for Patients: PinkCheek.be Fact Sheet for Healthcare Providers: GravelBags.it This test is not yet approved or cleared by the [place] FDA and  has been authorized for detection and/or diagnosis of SARS-CoV-2 by  FDA under an Emergency Use Authorization (EUA). This EUA will remain  in effect (meaning this test can be used) for the duration of the  Covid-19 declaration under Section 564(b)(1) of the Act, 21  U.S.C. section 360bbb-3(b)(1), unless the authorization is  terminated or revoked. Performed at [facility], [place] [address], [place], [place] [address]   MRSA PCR Screening     Status: None   Collection Time: 09/06/19  4:55 AM   Specimen: Nasopharyngeal  Result Value Ref Range Status   MRSA by PCR NEGATIVE NEGATIVE Final    Comment:        The GeneXpert MRSA Assay (FDA approved for NASAL specimens only), is one component of a comprehensive MRSA colonization surveillance  program. It is not intended to diagnose MRSA infection nor to guide or monitor treatment for MRSA infections. Performed at [facility], [place] [address] [place], [place] [address]   Surgical pcr screen     Status: None   Collection Time: 09/07/19  7:45 PM   Specimen: Nasal Mucosa; Nasal Swab  Result Value Ref Range Status   MRSA, PCR NEGATIVE NEGATIVE Final   Staphylococcus aureus NEGATIVE NEGATIVE Final    Comment: (NOTE) The Xpert SA Assay (FDA approved for NASAL specimens in patients 41 years of age and older), is one component of a comprehensive surveillance program. It is not intended to diagnose infection nor to guide or monitor treatment. Performed at [facility], [place] [address] [place], [place] [address]   Culture, blood (Routine X 2) w Reflex to ID Panel  Status: None (Preliminary result)   Collection Time: 09/10/19  9:20 AM   Specimen: BLOOD RIGHT HAND  Result Value Ref Range Status   Specimen Description BLOOD RIGHT HAND  Final   Special Requests   Final    BOTTLES DRAWN AEROBIC ONLY Blood Culture results may not be optimal due to an inadequate volume of blood received in culture bottles   Culture   Final    NO GROWTH < 24 HOURS Performed at [facility], [place] [address], [place], [place] [address]    Report Status PENDING  Incomplete  Culture, blood (Routine X 2) w Reflex to ID Panel     Status: None (Preliminary result)   Collection Time: 09/10/19 10:15 AM   Specimen: BLOOD RIGHT ARM  Result Value Ref Range Status   Specimen Description BLOOD RIGHT ARM  Final   Special Requests   Final    BOTTLES DRAWN AEROBIC ONLY Blood Culture adequate volume   Culture   Final    NO GROWTH < 24 HOURS Performed at [facility], [place] [address], [place], [place] [address]    Report Status PENDING  Incomplete         Radiology Studies: MR FOOT RIGHT WO CONTRAST  Result Date: 09/10/2019 CLINICAL DATA:  Fever elevated WBC infection of the  right forefoot EXAM: MRI OF THE RIGHT FOREFOOT WITHOUT CONTRAST TECHNIQUE: Multiplanar, multisequence MR imaging of the right was performed. No intravenous contrast was administered. COMPARISON:  None. 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 There is mildly increased T2 hyperintense signal seen within the proximal third metatarsal shaft with a minimally increased T1 hypointense signal. There is also mildly increased marrow signal seen at the amputation site of the first metatarsal, however no T1 hypointensity. Ligaments The Lisfranc ligaments are intact. Muscles and Tendons Mild fatty atrophy noted within the muscles surrounding the forefoot. The visualized portions of the extensor and flexor tendons are intact. Soft tissues Mild skin irregularity with superficial ulceration and subcutaneous edema seen overlying the third metatarsal amputation site. However no sinus tract or loculated fluid collection. IMPRESSION: Mild overlying skin irregularity and edema seen at the third and fourth metatarsal amputation sites with intramedullary signal changes in the proximal third metatarsal shaft which could be due to mild osteomyelitis or reactive marrow. No soft tissue abscess or sinus tract. Probable reactive marrow within the amputation site of the first metatarsal. Electronically Signed   By: Prudencio Pair M.D.   On: 09/10/2019 20:55   IR Fluoro Guide CV Line Left  Result Date: 09/10/2019 INDICATION: 79 year old male with end-stage renal disease on hemodialysis. 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 He requires a temporary catheter for hemodialysis. Ultrasound evaluation of the right internal jugular vein demonstrates an occluded IJ. On the left, a central venous catheter is currently present. The left internal jugular vein narrows around the catheter and may also be occluded. EXAM: 1.  Ultrasound-guided puncture left internal jugular vein 2. Catheter exchange MEDICATIONS: None. ANESTHESIA/SEDATION: None FLUOROSCOPY TIME:  Fluoroscopy Time: 6 minutes 12 seconds (25 mGy). COMPLICATIONS: None immediate. PROCEDURE: Informed written consent was obtained from the patient after a thorough discussion of the procedural risks, benefits and alternatives. All questions were addressed. Maximal Sterile Barrier Technique was utilized including caps, mask, sterile gowns, sterile gloves, sterile drape, hand hygiene and skin antiseptic. A timeout was performed prior to the initiation  of the procedure. The left internal jugular vein was interrogated with ultrasound and found to be abnormal. A central venous catheter is present within the vessel. More superiorly in the neck, the vessel remains patent although the wall is thickened. However, as ultrasound was used to track more distally along the vessel, the vessel becomes narrowed surrounding the central venous catheter concerning for occlusion. As the patient has very poor access options, the decision was made to attempt puncture of the patent portion of the IJ to see of a wire would pass centrally. An image was obtained and stored for the medical record. Local anesthesia was attained by infiltration with 1% lidocaine. Under real-time sonographic guidance, the vessel was punctured with a 21 gauge micropuncture needle. Unfortunately, a wire could not be passed into the central veins. At this time, the decision was made to proceed with exchange of the existing central venous catheter for a temporary hemodialysis catheter with an additional third lumen for venous access. The retention sutures were cut of the existing triple-lumen catheter. The hub was cut off the catheter. A stiff glidewire was advanced through the central lumen. Fluoroscopically, the tip of the existing catheter was actually within the azygos vein. The catheter was pulled back in till the tip was in  the upper SVC. The stiff Glidewire was then successfully advanced through the heart and into the inferior vena cava. The central venous catheter was removed over the wire. The skin tract was dilated and a 20 cm triple-lumen non tunneled hemodialysis catheter was advanced over the wire and position with the tip at the superior cavoatrial junction. The catheter flushes and aspirates easily. The catheter was flushed with heparinized saline and secured to the skin with 0 Prolene suture. A sterile bandage was applied. IMPRESSION: 1. Occluded right internal jugular vein. 2. The left internal jugular vein is also occluded surrounding the existing central venous catheter. 3. A new catheter could not be placed, therefore the existing triple-lumen catheter was exchanged over a wire for a triple-lumen temporary hemodialysis catheter. Signed, Criselda Peaches, MD, [facility] Electronically Signed   By: Jacqulynn Cadet M.D.   On: 09/10/2019 15:25   IR US Guide Vasc Access Left  Result Date: 09/10/2019 INDICATION: 79 year old male with end-stage renal disease on hemodialysis. 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 He requires a temporary catheter for hemodialysis. Ultrasound evaluation of the right internal jugular vein demonstrates an occluded IJ. On the left, a central venous catheter is currently present. The left internal jugular vein narrows around the catheter and may also be occluded. EXAM: 1. Ultrasound-guided puncture left internal jugular vein 2. Catheter exchange MEDICATIONS: None. ANESTHESIA/SEDATION: None FLUOROSCOPY TIME:  Fluoroscopy Time: 6 minutes 12 seconds (25 mGy). COMPLICATIONS: None immediate. PROCEDURE: Informed written consent was obtained from the patient after a thorough discussion of the procedural risks, benefits and alternatives. All questions were addressed. Maximal Sterile  Barrier Technique was utilized including caps, mask, sterile gowns, sterile gloves, sterile drape, hand hygiene and skin antiseptic. A timeout was performed prior to the initiation of the procedure. The left internal jugular vein was interrogated with ultrasound and found to be abnormal. A central venous catheter is present within the vessel. More superiorly in the neck, the vessel remains patent although the wall is thickened. However, as ultrasound was used to track more distally along the vessel, the vessel becomes narrowed surrounding the central venous catheter  concerning for occlusion. As the patient has very poor access options, the decision was made to attempt puncture of the patent portion of the IJ to see of a wire would pass centrally. An image was obtained and stored for the medical record. Local anesthesia was attained by infiltration with 1% lidocaine. Under real-time sonographic guidance, the vessel was punctured with a 21 gauge micropuncture needle. Unfortunately, a wire could not be passed into the central veins. At this time, the decision was made to proceed with exchange of the existing central venous catheter for a temporary hemodialysis catheter with an additional third lumen for venous access. The retention sutures were cut of the existing triple-lumen catheter. The hub was cut off the catheter. A stiff glidewire was advanced through the central lumen. Fluoroscopically, the tip of the existing catheter was actually within the azygos vein. The catheter was pulled back in till the tip was in the upper SVC. The stiff Glidewire was then successfully advanced through the heart and into the inferior vena cava. The central venous catheter was removed over the wire. The skin tract was dilated and a 20 cm triple-lumen non tunneled hemodialysis catheter was advanced over the wire and position with the tip at the superior cavoatrial junction. The catheter flushes and aspirates easily. The catheter was  flushed with heparinized saline and secured to the skin with 0 Prolene suture. A sterile bandage was applied. IMPRESSION: 1. Occluded right internal jugular vein. 2. The left internal jugular vein is also occluded surrounding the existing central venous catheter. 3. A new catheter could not be placed, therefore the existing triple-lumen catheter was exchanged over a wire for a triple-lumen temporary hemodialysis catheter. Signed, Criselda Peaches, MD, [facility] Electronically Signed   By: Jacqulynn Cadet M.D.   On: 09/10/2019 15:25   DG CHEST PORT 1 VIEW  Result Date: 09/10/2019 CLINICAL DATA:  Elevated white count. EXAM: PORTABLE CHEST 1 VIEW COMPARISON:  Sep 03, 2019. FINDINGS: Stable cardiomediastinal silhouette. Atherosclerosis of thoracic aorta is noted. Left internal jugular catheter is unchanged in position. No pneumothorax is noted. Right lung is clear. Mild left basilar atelectasis or infiltrate is noted with small left pleural effusion. Bony thorax is unremarkable. IMPRESSION: Aortic atherosclerosis. Mild left basilar atelectasis or infiltrate is noted with small left pleural effusion. Aortic Atherosclerosis (ICD10-I70.0). Electronically Signed   By: Marijo Conception M.D.   On: 09/10/2019 10:57        Scheduled Meds: . amiodarone  200 mg Oral Daily  . vitamin C  500 mg Oral Q24H  . atorvastatin  40 mg Oral q1800  . B-complex with vitamin C  1 tablet Oral Daily  . Chlorhexidine Gluconate Cloth  6 each Topical Q0600  . cholecalciferol  1,000 Units Oral Daily  . cinacalcet  60 mg Oral QHS  . darbepoetin (ARANESP) injection - DIALYSIS  100 mcg Intravenous Q Tue-HD  . docusate sodium  100 mg Oral BID  . feeding supplement (NEPRO CARB STEADY)  237 mL Oral TID BM  . feeding supplement (PRO-STAT SUGAR FREE 64)  30 mL Oral BID  . heparin sodium (porcine)      . melatonin  3 mg Oral QHS  . midodrine  10 mg Oral TID WC  .  pantoprazole  40 mg Oral BID  . sodium chloride flush  10-40 mL Intracatheter Q12H  . sodium chloride flush  3 mL Intravenous Q12H  . sodium chloride flush  3 mL  Intravenous Q12H   Continuous Infusions: . sodium chloride    . sodium chloride    . sodium chloride Stopped (09/04/19 1236)  . ferric gluconate (FERRLECIT/NULECIT) IV 125 mg (09/06/19 2126)     LOS: 8 days    Time spent: 35 minutes    Irine Seal, MD [facility]   To contact the attending provider between 7A-7P or the covering provider during after hours 7P-7A, please log into the web site [email or web address] and access using universal  password for that web site. If you do not have the password, please call the hospital operator.  09/11/2019, 11:52 AM

## 2019-09-12 LAB — RENAL FUNCTION PANEL
Albumin: 1.5 g/dL — ABNORMAL LOW (ref 3.5–5.0)
Anion gap: 10 (ref 5–15)
BUN: 20 mg/dL (ref 8–23)
CO2: 27 mmol/L (ref 22–32)
Calcium: 7.9 mg/dL — ABNORMAL LOW (ref 8.9–10.3)
Chloride: 100 mmol/L (ref 98–111)
Creatinine, Ser: 4.5 mg/dL — ABNORMAL HIGH (ref 0.61–1.24)
GFR calc Af Amer: 14 mL/min — ABNORMAL LOW (ref >60)
GFR calc non Af Amer: 12 mL/min — ABNORMAL LOW (ref >60)
Glucose, Bld: 92 mg/dL (ref 70–99)
Phosphorus: 3.4 mg/dL (ref 2.5–4.6)
Potassium: 4 mmol/L (ref 3.5–5.1)
Sodium: 137 mmol/L (ref 135–145)

## 2019-09-12 LAB — CBC WITH DIFFERENTIAL/PLATELET
Abs Immature Granulocytes: 0.18 10*3/uL — ABNORMAL HIGH (ref 0.00–0.07)
Basophils Absolute: 0.1 10*3/uL (ref 0.0–0.1)
Basophils Relative: 0 %
Eosinophils Absolute: 0.5 10*3/uL (ref 0.0–0.5)
Eosinophils Relative: 2 %
HCT: 24.9 % — ABNORMAL LOW (ref 39.0–52.0)
Hemoglobin: 7.9 g/dL — ABNORMAL LOW (ref 13.0–17.0)
Immature Granulocytes: 1 %
Lymphocytes Relative: 12 %
Lymphs Abs: 2.6 10*3/uL (ref 0.7–4.0)
MCH: 20.6 pg — ABNORMAL LOW (ref 26.0–34.0)
MCHC: 31.7 g/dL (ref 30.0–36.0)
MCV: 64.8 fL — ABNORMAL LOW (ref 80.0–100.0)
Monocytes Absolute: 1.3 10*3/uL — ABNORMAL HIGH (ref 0.1–1.0)
Monocytes Relative: 6 %
Neutro Abs: 17.9 10*3/uL — ABNORMAL HIGH (ref 1.7–7.7)
Neutrophils Relative %: 79 %
Platelets: 302 10*3/uL (ref 150–400)
RBC: 3.84 MIL/uL — ABNORMAL LOW (ref 4.22–5.81)
RDW: 23.9 % — ABNORMAL HIGH (ref 11.5–15.5)
WBC: 22.5 10*3/uL — ABNORMAL HIGH (ref 4.0–10.5)
nRBC: 0.1 % (ref 0.0–0.2)

## 2019-09-12 MED ORDER — DARBEPOETIN ALFA 100 MCG/0.5ML IJ SOSY
100.0000 ug | PREFILLED_SYRINGE | INTRAMUSCULAR | Status: DC
  Administered 2019-09-13 – 2019-09-20 (×2): 100 ug via INTRAVENOUS
  Filled 2019-09-12 (×2): qty 0.5

## 2019-09-12 MED ORDER — CHLORHEXIDINE GLUCONATE CLOTH 2 % EX PADS
6.0000 | MEDICATED_PAD | Freq: Every day | CUTANEOUS | Status: DC
  Administered 2019-09-13 – 2019-09-23 (×11): 6 via TOPICAL

## 2019-09-12 NOTE — Progress Notes (Signed)
[facility] Progress Note   Subjective:   Patient has no complaints today.  Eating breakfast  Objective Vitals:   09/11/19 2050 09/12/19 0500 09/12/19 0602 09/12/19 0800  BP: 114/82   (!) 120/55  Pulse:   80   Resp: (!) 21   15  Temp: 98.9 F (37.2 C)  98.5 F (36.9 C) 97.8 F (36.6 C)  TempSrc: Oral  Axillary Oral  SpO2: 97%   96%  Weight:  54.9 kg    Height:       Physical Exam General:chronically ill appearing, frail male in NAD Heart:RRR, +4/4 systolic murmur Lungs:CTA anteriolaterally, bilateral chest rise Abdomen:soft, NTND Extremities:L AKA with wound vac  Dialysis Access: L thigh AVG +b but as above dressing over AVG  Filed Weights   09/10/19 0400 09/11/19 0645 09/12/19 0500  Weight: 54.3 kg 53.9 kg 54.9 kg    Intake/Output Summary (Last 24 hours) at 09/12/2019 1123 Last data filed at 09/12/2019 0818 Gross per 24 hour  Intake 10 ml  Output --  Net 10 ml    Additional Objective Labs: Basic Metabolic Panel: Recent Labs  Lab 09/10/19 0500 09/11/19 0752 09/12/19 0434  NA 137 136 137  K 4.3 3.7 4.0  CL 100 99 100  CO2 23 28 27   GLUCOSE 91 116* 92  BUN 36* 14 20  CREATININE 6.79* 3.36* 4.50*  CALCIUM 7.8* 7.4* 7.9*  PHOS 3.8 2.4* 3.4   Liver Function Tests: Recent Labs  Lab 09/10/19 0500 09/11/19 0752 09/12/19 0434  ALBUMIN 1.5* 1.5* 1.5*   CBC: Recent Labs  Lab 09/07/19 0110 09/07/19 0110 09/09/19 0500 09/09/19 0500 09/10/19 0500 09/11/19 0752 09/12/19 0434  WBC 19.5*   < > 23.6*   < > 27.5* 21.9* 22.5*  NEUTROABS 14.9*   < > 21.5*   < > 21.6* 18.6* 17.9*  HGB 8.4*   < > 8.2*   < > 8.1* 8.0* 7.9*  HCT 26.5*   < > 26.4*   < > 25.1* 24.9* 24.9*  MCV 65.6*  --  66.8*  --  65.5* 64.5* 64.8*  PLT 281   < > 263   < > 306 302 302   < > = values in this interval not displayed.   Iron Studies:  No results for input(s): IRON, TIBC, TRANSFERRIN, FERRITIN in the last 72 hours. Lab Results  Component Value Date   INR 1.1  12/01/2018   INR 1.03 01/01/2018   INR 1.03 11/27/2017   Studies/Results: MR FOOT RIGHT WO CONTRAST  Result Date: 09/10/2019 CLINICAL DATA:  Fever elevated WBC infection of the right forefoot EXAM: MRI OF THE RIGHT FOREFOOT WITHOUT CONTRAST TECHNIQUE: Multiplanar, multisequence MR imaging of the right was performed. No intravenous contrast was administered. COMPARISON:  None. 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 There is mildly increased T2 hyperintense signal seen within the proximal third metatarsal shaft with a minimally increased T1 hypointense signal. There is also mildly increased marrow signal seen at the amputation site of the first metatarsal, however no T1 hypointensity. Ligaments The Lisfranc ligaments are intact. Muscles and Tendons Mild fatty atrophy noted within the muscles surrounding the forefoot. The visualized portions of the extensor and flexor tendons are intact. Soft tissues Mild skin irregularity with superficial ulceration and subcutaneous edema seen overlying the third metatarsal amputation site. However no sinus tract or loculated fluid collection. IMPRESSION: Mild overlying skin irregularity and edema seen at the third and fourth  metatarsal amputation sites with intramedullary signal changes in the proximal third metatarsal shaft which could be due to mild osteomyelitis or reactive marrow. No soft tissue abscess or sinus tract. Probable reactive marrow within the amputation site of the first metatarsal. Electronically Signed   By: Prudencio Pair M.D.   On: 09/10/2019 20:55   IR Fluoro Guide CV Line Left  Result Date: 09/10/2019 INDICATION: 79 year old male with end-stage renal disease on hemodialysis. 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 He requires a temporary catheter for hemodialysis. Ultrasound evaluation of the right internal jugular vein  demonstrates an occluded IJ. On the left, a central venous catheter is currently present. The left internal jugular vein narrows around the catheter and may also be occluded. EXAM: 1. Ultrasound-guided puncture left internal jugular vein 2. Catheter exchange MEDICATIONS: None. ANESTHESIA/SEDATION: None FLUOROSCOPY TIME:  Fluoroscopy Time: 6 minutes 12 seconds (25 mGy). COMPLICATIONS: None immediate. PROCEDURE: Informed written consent was obtained from the patient after a thorough discussion of the procedural risks, benefits and alternatives. All questions were addressed. Maximal Sterile Barrier Technique was utilized including caps, mask, sterile gowns, sterile gloves, sterile drape, hand hygiene and skin antiseptic. A timeout was performed prior to the initiation of the procedure. The left internal jugular vein was interrogated with ultrasound and found to be abnormal. A central venous catheter is present within the vessel. More superiorly in the neck, the vessel remains patent although the wall is thickened. However, as ultrasound was used to track more distally along the vessel, the vessel becomes narrowed surrounding the central venous catheter concerning for occlusion. As the patient has very poor access options, the decision was made to attempt puncture of the patent portion of the IJ to see of a wire would pass centrally. An image was obtained and stored for the medical record. Local anesthesia was attained by infiltration with 1% lidocaine. Under real-time sonographic guidance, the vessel was punctured with a 21 gauge micropuncture needle. Unfortunately, a wire could not be passed into the central veins. At this time, the decision was made to proceed with exchange of the existing central venous catheter for a temporary hemodialysis catheter with an additional third lumen for venous access. The retention sutures were cut of the existing triple-lumen catheter. The hub was cut off the catheter. A stiff  glidewire was advanced through the central lumen. Fluoroscopically, the tip of the existing catheter was actually within the azygos vein. The catheter was pulled back in till the tip was in the upper SVC. The stiff Glidewire was then successfully advanced through the heart and into the inferior vena cava. The central venous catheter was removed over the wire. The skin tract was dilated and a 20 cm triple-lumen non tunneled hemodialysis catheter was advanced over the wire and position with the tip at the superior cavoatrial junction. The catheter flushes and aspirates easily. The catheter was flushed with heparinized saline and secured to the skin with 0 Prolene suture. A sterile bandage was applied. IMPRESSION: 1. Occluded right internal jugular vein. 2. The left internal jugular vein is also occluded surrounding the existing central venous catheter. 3. A new catheter could not be placed, therefore the existing triple-lumen catheter was exchanged over a wire for a triple-lumen temporary hemodialysis catheter. Signed, Criselda Peaches, MD, [facility] Radiology Electronically Signed   By: Jacqulynn Cadet M.D.   On: 09/10/2019 15:25   IR US Guide Vasc  Access Left  Result Date: 09/10/2019 INDICATION: 79 year old male with end-stage renal disease on hemodialysis. 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 He requires a temporary catheter for hemodialysis. Ultrasound evaluation of the right internal jugular vein demonstrates an occluded IJ. On the left, a central venous catheter is currently present. The left internal jugular vein narrows around the catheter and may also be occluded. EXAM: 1. Ultrasound-guided puncture left internal jugular vein 2. Catheter exchange MEDICATIONS: None. ANESTHESIA/SEDATION: None FLUOROSCOPY TIME:  Fluoroscopy Time: 6 minutes 12 seconds (25 mGy). COMPLICATIONS: None immediate.  PROCEDURE: Informed written consent was obtained from the patient after a thorough discussion of the procedural risks, benefits and alternatives. All questions were addressed. Maximal Sterile Barrier Technique was utilized including caps, mask, sterile gowns, sterile gloves, sterile drape, hand hygiene and skin antiseptic. A timeout was performed prior to the initiation of the procedure. The left internal jugular vein was interrogated with ultrasound and found to be abnormal. A central venous catheter is present within the vessel. More superiorly in the neck, the vessel remains patent although the wall is thickened. However, as ultrasound was used to track more distally along the vessel, the vessel becomes narrowed surrounding the central venous catheter concerning for occlusion. As the patient has very poor access options, the decision was made to attempt puncture of the patent portion of the IJ to see of a wire would pass centrally. An image was obtained and stored for the medical record. Local anesthesia was attained by infiltration with 1% lidocaine. Under real-time sonographic guidance, the vessel was punctured with a 21 gauge micropuncture needle. Unfortunately, a wire could not be passed into the central veins. At this time, the decision was made to proceed with exchange of the existing central venous catheter for a temporary hemodialysis catheter with an additional third lumen for venous access. The retention sutures were cut of the existing triple-lumen catheter. The hub was cut off the catheter. A stiff glidewire was advanced through the central lumen. Fluoroscopically, the tip of the existing catheter was actually within the azygos vein. The catheter was pulled back in till the tip was in the upper SVC. The stiff Glidewire was then successfully advanced through the heart and into the inferior vena cava. The central venous catheter was removed over the wire. The skin tract was dilated and a 20 cm  triple-lumen non tunneled hemodialysis catheter was advanced over the wire and position with the tip at the superior cavoatrial junction. The catheter flushes and aspirates easily. The catheter was flushed with heparinized saline and secured to the skin with 0 Prolene suture. A sterile bandage was applied. IMPRESSION: 1. Occluded right internal jugular vein. 2. The left internal jugular vein is also occluded surrounding the existing central venous catheter. 3. A new catheter could not be placed, therefore the existing triple-lumen catheter was exchanged over a wire for a triple-lumen temporary hemodialysis catheter. Signed, Criselda Peaches, MD, [facility] Radiology Electronically Signed   By: Jacqulynn Cadet M.D.   On: 09/10/2019 15:25    Medications: . sodium chloride    . sodium chloride    . sodium chloride Stopped (09/04/19 1236)  . ferric gluconate (FERRLECIT/NULECIT) IV 125 mg (09/06/19 2126)   . amiodarone  200 mg Oral Daily  . vitamin C  500 mg Oral Q24H  . atorvastatin  40 mg Oral q1800  . B-complex with vitamin C  1 tablet  Oral Daily  . Chlorhexidine Gluconate Cloth  6 each Topical Q0600  . cholecalciferol  1,000 Units Oral Daily  . cinacalcet  60 mg Oral QHS  . darbepoetin (ARANESP) injection - DIALYSIS  100 mcg Intravenous Q Tue-HD  . docusate sodium  100 mg Oral BID  . feeding supplement (NEPRO CARB STEADY)  237 mL Oral TID BM  . feeding supplement (PRO-STAT SUGAR FREE 64)  30 mL Oral BID  . melatonin  3 mg Oral QHS  . midodrine  10 mg Oral TID WC  . pantoprazole  40 mg Oral BID  . sodium chloride flush  10-40 mL Intracatheter Q12H  . sodium chloride flush  3 mL Intravenous Q12H  . sodium chloride flush  3 mL Intravenous Q12H    Dialysis Orders: [facility] - MWF- 4 hours-  Thigh AVG-  BFR 400/DFR800-  2 K bath as OP-  EDW 62.  Gets heparin per tx-  2000 units per hour, epo 4000 and sensipar 60 mg.  Last hgb  there was 4/26  9.9   Assessment/Plan: 1. GIB - EGD w/duodenal bulb ulcer. Gastric bx without malignancy or H. pylori.  PPI per primary team.  Hemoglobin relatively stable at this time 2. 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  Wound VAC in place. 3. Ventricular ectopy - asymptomatic.  On amiodarone orally 4. ESRD- On HD MWF.  Tolerating dialysis well with no issues.  Continue to per his home schedule.   5. Dialysis Access: Left lower extremity thigh graft unable to access because of wound VAC.  Has temporary catheter because of leukocytosis.  Would proceed with tunneled dialysis catheter when able. 6. Hypotension/volume-no signs of significant volume overload on midodrine TID.    7. Anemiaof CKD- Hgb stable in 8s  s/p 1unit pRBC.  Status post iron for low T sat of 11%.  Continue ESA  8. Secondary Hyperparathyroidism -calcium and phosphorus acceptable.  Continue sensipar- does not get vit D with HD-phosphorus slightly low yesterday but improved today.  Continue to monitor 9. Nutrition- Renal diet w/fluid restrictions  [place] 09/12/2019,11:23 AM  LOS: 9 days

## 2019-09-12 NOTE — Progress Notes (Signed)
PROGRESS NOTE    [name]  FAO:[id number] DOB: December 08, 1940 DOA: 09/03/2019 PCP: [name], MD    No chief complaint on file.   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 However in the preoperative area, patient noted to have rectal bleeding for which [facility] was consulted and advised that the procedure will need to be postponed.  Patient was subsequently admitted.    Assessment & Plan:   Principal Problem:   GI bleeding Active Problems:   Encounter for central line placement   ESRD on dialysis [facility])   Aortic stenosis   PVD (peripheral vascular disease) ([place])   Essential hypertension   Duodenal ulcer   Palliative care encounter   DNR (do not resuscitate) discussion   Goals of care, counseling/discussion   Dehiscence of amputation stump ([place])   Chronic hypotension   SVT (supraventricular tachycardia) ([facility])   Ventricular ectopy  1 upper GI bleed secondary to duodenal ulcer/acute blood loss anemia Patient noted to have some rectal bleeding in the preop area prior to surgery and subsequently admitted.  Patient transfused a unit of packed red  blood cells 09/04/2019.  Plavix currently on hold.  Status post upper endoscopy 09/04/2019 that showed duodenal ulcer which was likely cause of patient's bleeding per GI.  Colonoscopy subsequently was not performed as patient also had a poor prep.  H&H stable at 7.9.  Continue PPI every 12 hours.  Continue to hold Plavix for now and could likely resume Plavix once permanent catheter has been placed.  Follow.   2.  End-stage renal disease on hemodialysis Patient noted per nephrology that dressing from wound VAC currently over patient's AV graft.  Per nephrology thigh graft not accessible for dialysis and assess patient will need a tunneled dialysis catheter.  Patient seen by IR and underwent exchange of left IJ central line for triple-lumen HD catheter by Dr. Laurence Ferrari.  Patient had hemodialysis the evening of 09/10/2019.  Per nephrology patient will need permanent access prior to discharge.  Per nephrology.    3.  Progressive gangrenous necrotic changes with stump dehiscence of left transtibial amputation site Patient seen by orthopedics.  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  Currently on Prevena boot.  Per orthopedics.  4.  Leukocytosis Likely secondary to problem #3.  Worsening leukocytosis.  Status post left AKA.  Patient started on IV Zyvox.  Repeat blood cultures pending with no growth to date.  Due to worsening leukocytosis ID consulted who recommended discontinuation of antibiotics, MRI of the right foot, and consideration for CT chest abdomen and pelvis if worsening WBC and no cause found.  Leukocytosis trending down trending down and fluctuating.  Antibiotics have been discontinued per ID.  ID following.  Follow.   5.  Chronic diastolic heart failure Compensated.  On hemodialysis.  Per nephrology.  6.  Chronic hypotension Midodrine.   7. SVT Patient noted to  have a run of SVT with heart rates as high as the 200s in the PACU postoperatively on  09/08/2019.  Patient received a dose of IV metoprolol and SVT broken and heart rate in the 70s.  Patient started on amiodarone and dose increased to 200 mg daily per cardiology for ventricular ectopy.  Will need outpatient follow-up with cardiology.   8.  Ventricular ectopy Patient started on amiodarone per cardiology.  Amiodarone dose increased to 200 mg daily per cardiology.  Outpatient follow-up with cardiology.  Follow.  9.  Severe Aortic stenosis Noted on TEE during previous admission.  Try to avoid hypotension.  Not a candidate for TAVR.  Cardiology following.  10.  Prolonged QTC Repeat EKG with QTC of 509.  Keep magnesium > 2.  Keep potassium greater > 4.  11.  Iron deficiency anemia/anemia of chronic disease H&H stable at 7.9.  Follow.  12.  History of CVA Plavix currently on hold.  Could potentially likely resume Plavix after permanent catheter has been placed before discharge.    13.  Generalized deconditioning Patient overall with a poor prognosis.  Palliative care has assessed and followed patient.  Patient now with partial code.  Patient wishes to have CPR and ACLS medication but no intubation or shock.  PT/OT.  Needs SNF.     DVT prophylaxis: Recent GI bleed.  Avoiding chemical prophylaxis.  SCDs. Code Status: Partial Family Communication: Updated patient, no family at bedside.  Disposition:   Status is: Inpatient    Dispo: The patient is from: SNF              Anticipated d/c is to: SNF              Anticipated d/c date is: 1-2 days.              Patient currently not medically stable for discharge.  Patient with a worsening leukocytosis post amputation.  Needs tunneled dialysis access catheter as dressing over thigh AV graft per nephrology which was done  09/10/2019.  Needs tunneled dialysis catheter prior to discharge.        Consultants:   Gastroenterology: Dr. Tarri Glenn 09/03/2019  Nephrology: Dr.Schertz 09/03/2019  Palliative care: Lindell Spar, NP  09/04/2019  Cardiology: Dr. Lovena Le 09/04/2019  Domenic Moras, FNP 09/07/2019  Orthopedics: Dr. Sharol Given  ID: Dr. Tommy Medal 09/10/2019  Procedures:   Chest x-ray 09/03/2019  Upper endoscopy 09/04/2019--per Dr. Tarri Glenn  Left above-the-knee amputation per Dr. Sharol Given 09/08/2019 EGD on 09/04/2019 Impression: - Duodenal ulcer may be the cause of recent overt  GI bleeding. 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 Biopsied. - Erosive gastropathy with no bleeding and no  stigmata of recent bleeding. Biopsied. - A single non-bleeding angioectasia in the  duodenum. - Small hiatal hernia. - Unable to proceed with colonoscopy as planned due  to incomplete preparation. Patient had formed stool  on last bowel movement. Recommendation: - Return patient to hospital ward for ongoing care. - Clear liquid diet. Advance as tolerated. - No aspirin, ibuprofen, naproxen, or other  non-steroidal anti-inflammatory drugs. - Patient has a prolonged QT. Discussed with Dr.  Starla Link who agreed with trial of pantoprazole 40 mg  IV BID with close monitoring of QTc. - Will defer colonoscopy at this time given the  challenges that the patient has prepping for the  procedure. Duodenal ulcer may be the source of  bleeding. - Continue serial hgb/hct with transfusion as  indicated.  Exchange of left IJ central line for triple-lumen HD catheter--per IR  Dr. Laurence Ferrari 09/10/2019  MRI right foot 09/10/2019  Antimicrobials:   IV Ancef 09/08/2019 preoperatively  IV Zyvox 09/08/2019>>>>> 09/10/2019   Subjective: Patient sleeping but arousable.  Denies any chest pain or shortness of breath.  Objective: Vitals:   09/12/19 0500 09/12/19 0602 09/12/19 0800 09/12/19 1200  BP:   (!) 120/55 (!) 121/56  Pulse:  80    Resp:   15 19  Temp:  98.5 F (36.9 C) 97.8 F (36.6 C) 98.1 F (36.7 C)  TempSrc:  Axillary Oral Oral  SpO2:   96% 97%  Weight: 54.9 kg     Height:        Intake/Output Summary (Last 24 hours) at 09/12/2019 1219 Last data filed at 09/12/2019 0818 Gross per 24 hour  Intake 10 ml  Output --  Net 10 ml   Filed Weights   09/10/19 0400 09/11/19 0645 09/12/19 0500  Weight: 54.3 kg 53.9 kg 54.9 kg    Examination:  General exam: NAD Respiratory system: CTA B anterior lung fields.  No wheezes, no crackles, no rhonchi.  Cardiovascular system: Regular rate rhythm with a grade 2/6 systolic ejection murmur.  No JVD.  No lower extremity edema.  Gastrointestinal system: Abdomen is soft, nontender, nondistended, positive bowel sounds.  No rebound.  No guarding.  Central nervous system: Alert and oriented. No focal neurological deficits. Extremities: Status post left BKA with wound VAC in place.  Status post right transmetatarsal amputation. Skin: No rashes, lesions or ulcers Psychiatry: Judgement and insight appear normal. Mood & affect appropriate.     Data Reviewed: I have personally reviewed following labs and imaging studies  CBC: Recent Labs  Lab 09/07/19 0110 09/09/19 0500 09/10/19 0500 09/11/19 0752 09/12/19 0434  WBC 19.5* 23.6* 27.5* 21.9* 22.5*  NEUTROABS 14.9* 21.5* 21.6* 18.6* 17.9*  HGB 8.4* 8.2* 8.1* 8.0* 7.9*  HCT 26.5* 26.4* 25.1* 24.9* 24.9*  MCV 65.6* 66.8* 65.5* 64.5* 64.8*  PLT 281 263 306 302 378    Basic Metabolic Panel: Recent Labs  Lab 09/06/19 0328 09/06/19 0328 09/07/19 0110  09/09/19 0500 09/10/19 0500 09/11/19 0752 09/12/19 0434  NA 140   < > 140 138 137 136 137  K 3.4*   < > 3.8 3.9 4.3 3.7 4.0  CL 103   < > 103 103 100 99 100  CO2 26   < > 24 25 23 28 27   GLUCOSE 88   < > 89 102* 91 116* 92  BUN 31*   < > 37* 29* 36* 14 20  CREATININE 6.15*   < > 7.05* 5.42* 6.79* 3.36* 4.50*  CALCIUM 8.4*   < > 8.2* 7.7* 7.8* 7.4* 7.9*  MG 2.1  --   --  1.8 2.4  --   --   PHOS  --   --   --  3.6 3.8 2.4* 3.4   < > = values in this interval not displayed.    GFR: Estimated Creatinine Clearance: 10.8 mL/min (A) (by C-G formula based on SCr of 4.5 mg/dL (H)).  Liver Function Tests: Recent Labs  Lab 09/09/19 0500 09/10/19 0500 09/11/19 0752 09/12/19 0434  ALBUMIN 1.6* 1.5* 1.5* 1.5*    CBG: Recent Labs  Lab 09/08/19 1117  GLUCAP 105*     Recent Results (from the past 240 hour(s))  Respiratory Panel by RT PCR (Flu A&B, Covid) - Nasopharyngeal Swab     Status: None   Collection Time: 09/03/19  8:18 AM   Specimen: Nasopharyngeal Swab  Result Value Ref Range Status   SARS Coronavirus 2 by RT PCR NEGATIVE NEGATIVE Final    Comment: (NOTE) SARS-CoV-2 target nucleic acids are NOT DETECTED. The SARS-CoV-2 RNA is generally detectable in upper respiratoy specimens during the acute phase of infection. The lowest concentration of SARS-CoV-2 viral copies this assay can detect is 131 copies/mL. A negative result does not preclude SARS-Cov-2 infection and should not be used as the sole basis for treatment or other patient management decisions. A negative result may occur with  improper specimen collection/handling, submission of specimen other than nasopharyngeal swab, presence of viral mutation(s) within the areas targeted by this assay, and inadequate number of viral copies (<131 copies/mL). A negative result must be combined with clinical observations, patient history, and epidemiological information. The expected result is Negative. Fact Sheet for  Patients:  PinkCheek.be Fact Sheet for Healthcare Providers:  GravelBags.it This test is not yet ap proved or cleared by the [place] FDA and  has been authorized for detection and/or diagnosis of SARS-CoV-2 by FDA under an Emergency Use Authorization (EUA). This EUA will remain  in effect (meaning this test can be used) for the duration of the COVID-19 declaration under Section 564(b)(1) of the Act, 21 U.S.C. section 360bbb-3(b)(1), unless the authorization is terminated or revoked sooner.    Influenza A by PCR NEGATIVE NEGATIVE Final   Influenza B by PCR NEGATIVE NEGATIVE Final    Comment: (NOTE) The Xpert Xpress SARS-CoV-2/FLU/RSV assay is intended as an aid in  the diagnosis of influenza from Nasopharyngeal swab specimens and  should not be used as a sole basis for treatment. Nasal washings and  aspirates are unacceptable for Xpert Xpress SARS-CoV-2/FLU/RSV  testing. Fact Sheet for Patients: PinkCheek.be Fact Sheet for Healthcare Providers: GravelBags.it This test is not yet approved or cleared by the [place] FDA and  has been authorized for detection and/or diagnosis of SARS-CoV-2 by  FDA under an Emergency Use Authorization (EUA). This EUA will remain  in effect (meaning this test can be used) for the duration of the  Covid-19 declaration under Section 564(b)(1) of the Act, 21  U.S.C. section 360bbb-3(b)(1), unless the authorization is  terminated or revoked. Performed at [facility], [place] [address], [place], [place] [address]   MRSA PCR Screening     Status: None   Collection Time: 09/06/19  4:55 AM   Specimen: Nasopharyngeal  Result Value Ref Range Status   MRSA by PCR NEGATIVE NEGATIVE Final    Comment:        The GeneXpert MRSA Assay (FDA approved for NASAL specimens only), is one component of a comprehensive MRSA  colonization surveillance program. It is not intended to diagnose MRSA infection nor to guide or monitor treatment for MRSA infections. Performed at [facility], [place] [address] [place], [place] [address]   Surgical pcr screen     Status: None   Collection Time: 09/07/19  7:45 PM   Specimen: Nasal Mucosa; Nasal Swab  Result Value Ref Range Status   MRSA, PCR NEGATIVE NEGATIVE Final   Staphylococcus aureus NEGATIVE NEGATIVE Final    Comment: (NOTE) The Xpert SA Assay (FDA approved for NASAL specimens in patients 39 years of age and older), is one component of a comprehensive surveillance program. It is not intended to diagnose infection nor to guide or monitor treatment. Performed at [facility], [place] [address] [place],  [address]   Culture, blood (Routine X 2) w Reflex to ID  Panel     Status: None (Preliminary result)   Collection Time: 09/10/19  9:20 AM   Specimen: BLOOD RIGHT HAND  Result Value Ref Range Status   Specimen Description BLOOD RIGHT HAND  Final   Special Requests   Final    BOTTLES DRAWN AEROBIC ONLY Blood Culture results may not be optimal due to an inadequate volume of blood received in culture bottles   Culture   Final    NO GROWTH 2 DAYS Performed at [facility], [place] [address] [place], [place] [address]    Report Status PENDING  Incomplete  Culture, blood (Routine X 2) w Reflex to ID Panel     Status: None (Preliminary result)   Collection Time: 09/10/19 10:15 AM   Specimen: BLOOD RIGHT ARM  Result Value Ref Range Status   Specimen Description BLOOD RIGHT ARM  Final   Special Requests   Final    BOTTLES DRAWN AEROBIC ONLY Blood Culture adequate volume   Culture   Final    NO GROWTH 2 DAYS Performed at [facility], [place] [address], [place], [place] [address]    Report Status PENDING  Incomplete         Radiology Studies: MR FOOT RIGHT WO CONTRAST  Result Date: 09/10/2019 CLINICAL DATA:  Fever elevated WBC  infection of the right forefoot EXAM: MRI OF THE RIGHT FOREFOOT WITHOUT CONTRAST TECHNIQUE: Multiplanar, multisequence MR imaging of the right was performed. No intravenous contrast was administered. COMPARISON:  None. 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 There is mildly increased T2 hyperintense signal seen within the proximal third metatarsal shaft with a minimally increased T1 hypointense signal. There is also mildly increased marrow signal seen at the amputation site of the first metatarsal, however no T1 hypointensity. Ligaments The Lisfranc ligaments are intact. Muscles and Tendons Mild fatty atrophy noted within the muscles surrounding the forefoot. The visualized portions of the extensor and flexor tendons are intact. Soft tissues Mild skin irregularity with superficial ulceration and subcutaneous edema seen overlying the third metatarsal amputation site. However no sinus tract or loculated fluid collection. IMPRESSION: Mild overlying skin irregularity and edema seen at the third and fourth metatarsal amputation sites with intramedullary signal changes in the proximal third metatarsal shaft which could be due to mild osteomyelitis or reactive marrow. No soft tissue abscess or sinus tract. Probable reactive marrow within the amputation site of the first metatarsal. Electronically Signed   By: Prudencio Pair M.D.   On: 09/10/2019 20:55   IR Fluoro Guide CV Line Left  Result Date: 09/10/2019 INDICATION: 79 year old male with end-stage renal disease on hemodialysis. 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 He requires a temporary catheter for hemodialysis. Ultrasound evaluation of the right internal jugular vein demonstrates an occluded IJ. On the left, a central venous catheter is currently present. The left internal jugular vein narrows around the catheter and may also be  occluded. EXAM: 1. Ultrasound-guided puncture left internal jugular vein 2. Catheter exchange MEDICATIONS: None. ANESTHESIA/SEDATION: None FLUOROSCOPY TIME:  Fluoroscopy Time: 6 minutes 12 seconds (25 mGy). COMPLICATIONS: None immediate. PROCEDURE: Informed written consent was obtained from the patient after a thorough discussion of the procedural risks, benefits and alternatives. All questions were addressed. Maximal Sterile Barrier Technique was utilized including caps, mask, sterile gowns, sterile gloves, sterile drape, hand hygiene and skin antiseptic. A timeout was performed prior  to the initiation of the procedure. The left internal jugular vein was interrogated with ultrasound and found to be abnormal. A central venous catheter is present within the vessel. More superiorly in the neck, the vessel remains patent although the wall is thickened. However, as ultrasound was used to track more distally along the vessel, the vessel becomes narrowed surrounding the central venous catheter concerning for occlusion. As the patient has very poor access options, the decision was made to attempt puncture of the patent portion of the IJ to see of a wire would pass centrally. An image was obtained and stored for the medical record. Local anesthesia was attained by infiltration with 1% lidocaine. Under real-time sonographic guidance, the vessel was punctured with a 21 gauge micropuncture needle. Unfortunately, a wire could not be passed into the central veins. At this time, the decision was made to proceed with exchange of the existing central venous catheter for a temporary hemodialysis catheter with an additional third lumen for venous access. The retention sutures were cut of the existing triple-lumen catheter. The hub was cut off the catheter. A stiff glidewire was advanced through the central lumen. Fluoroscopically, the tip of the existing catheter was actually within the azygos vein. The catheter was pulled back in  till the tip was in the upper SVC. The stiff Glidewire was then successfully advanced through the heart and into the inferior vena cava. The central venous catheter was removed over the wire. The skin tract was dilated and a 20 cm triple-lumen non tunneled hemodialysis catheter was advanced over the wire and position with the tip at the superior cavoatrial junction. The catheter flushes and aspirates easily. The catheter was flushed with heparinized saline and secured to the skin with 0 Prolene suture. A sterile bandage was applied. IMPRESSION: 1. Occluded right internal jugular vein. 2. The left internal jugular vein is also occluded surrounding the existing central venous catheter. 3. A new catheter could not be placed, therefore the existing triple-lumen catheter was exchanged over a wire for a triple-lumen temporary hemodialysis catheter. Signed, Criselda Peaches, MD, [place] [facility] Radiology Electronically Signed   By: Jacqulynn Cadet M.D.   On: 09/10/2019 15:25   IR US Guide Vasc Access Left  Result Date: 09/10/2019 INDICATION: 79 year old male with end-stage renal disease on hemodialysis. 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 He requires a temporary catheter for hemodialysis. Ultrasound evaluation of the right internal jugular vein demonstrates an occluded IJ. On the left, a central venous catheter is currently present. The left internal jugular vein narrows around the catheter and may also be occluded. EXAM: 1. Ultrasound-guided puncture left internal jugular vein 2. Catheter exchange MEDICATIONS: None. ANESTHESIA/SEDATION: None FLUOROSCOPY TIME:  Fluoroscopy Time: 6 minutes 12 seconds (25 mGy). COMPLICATIONS: None immediate. PROCEDURE: Informed written consent was obtained from the patient after a thorough discussion of the procedural risks, benefits and alternatives. All questions were  addressed. Maximal Sterile Barrier Technique was utilized including caps, mask, sterile gowns, sterile gloves, sterile drape, hand hygiene and skin antiseptic. A timeout was performed prior to the initiation of the procedure. The left internal jugular vein was interrogated with ultrasound and found to be abnormal. A central venous catheter is present within the vessel. More superiorly in the neck, the vessel remains patent although the wall is thickened. However, as ultrasound was used to track more distally along the vessel, the vessel becomes narrowed surrounding the  central venous catheter concerning for occlusion. As the patient has very poor access options, the decision was made to attempt puncture of the patent portion of the IJ to see of a wire would pass centrally. An image was obtained and stored for the medical record. Local anesthesia was attained by infiltration with 1% lidocaine. Under real-time sonographic guidance, the vessel was punctured with a 21 gauge micropuncture needle. Unfortunately, a wire could not be passed into the central veins. At this time, the decision was made to proceed with exchange of the existing central venous catheter for a temporary hemodialysis catheter with an additional third lumen for venous access. The retention sutures were cut of the existing triple-lumen catheter. The hub was cut off the catheter. A stiff glidewire was advanced through the central lumen. Fluoroscopically, the tip of the existing catheter was actually within the azygos vein. The catheter was pulled back in till the tip was in the upper SVC. The stiff Glidewire was then successfully advanced through the heart and into the inferior vena cava. The central venous catheter was removed over the wire. The skin tract was dilated and a 20 cm triple-lumen non tunneled hemodialysis catheter was advanced over the wire and position with the tip at the superior cavoatrial junction. The catheter flushes and aspirates  easily. The catheter was flushed with heparinized saline and secured to the skin with 0 Prolene suture. A sterile bandage was applied. IMPRESSION: 1. Occluded right internal jugular vein. 2. The left internal jugular vein is also occluded surrounding the existing central venous catheter. 3. A new catheter could not be placed, therefore the existing triple-lumen catheter was exchanged over a wire for a triple-lumen temporary hemodialysis catheter. Signed, Criselda Peaches, MD, [place] [name] Radiology Electronically Signed   By: Jacqulynn Cadet M.D.   On: 09/10/2019 15:25        Scheduled Meds: . amiodarone  200 mg Oral Daily  . vitamin C  500 mg Oral Q24H  . atorvastatin  40 mg Oral q1800  . B-complex with vitamin C  1 tablet Oral Daily  . Chlorhexidine Gluconate Cloth  6 each Topical Q0600  . cholecalciferol  1,000 Units Oral Daily  . cinacalcet  60 mg Oral QHS  . darbepoetin (ARANESP) injection - DIALYSIS  100 mcg Intravenous Q Tue-HD  . docusate sodium  100 mg Oral BID  . feeding supplement (NEPRO CARB STEADY)  237 mL Oral TID BM  . feeding supplement (PRO-STAT SUGAR FREE 64)  30 mL Oral BID  . melatonin  3 mg Oral QHS  . midodrine  10 mg Oral TID WC  . pantoprazole  40 mg Oral BID  . sodium chloride flush  10-40 mL Intracatheter Q12H  . sodium chloride flush  3 mL Intravenous Q12H  . sodium chloride flush  3 mL Intravenous Q12H   Continuous Infusions: . sodium chloride    . sodium chloride    . sodium chloride Stopped (09/04/19 1236)  . ferric gluconate (FERRLECIT/NULECIT) IV 125 mg (09/06/19 2126)     LOS: 9 days    Time spent: 35 minutes    Irine Seal, MD [facility]   To contact the attending provider between 7A-7P or the covering provider during after hours 7P-7A, please log into the web site [email or web address] and access using universal [place] password for that web site. If you do not have the  password, please call the hospital operator.  09/12/2019, 12:19 PM

## 2019-09-13 LAB — RENAL FUNCTION PANEL
Albumin: 1.5 g/dL — ABNORMAL LOW (ref 3.5–5.0)
Anion gap: 11 (ref 5–15)
BUN: 28 mg/dL — ABNORMAL HIGH (ref 8–23)
CO2: 22 mmol/L (ref 22–32)
Calcium: 7.6 mg/dL — ABNORMAL LOW (ref 8.9–10.3)
Chloride: 103 mmol/L (ref 98–111)
Creatinine, Ser: 5.86 mg/dL — ABNORMAL HIGH (ref 0.61–1.24)
GFR calc Af Amer: 10 mL/min — ABNORMAL LOW (ref >60)
GFR calc non Af Amer: 9 mL/min — ABNORMAL LOW (ref >60)
Glucose, Bld: 83 mg/dL (ref 70–99)
Phosphorus: 4 mg/dL (ref 2.5–4.6)
Potassium: 5.1 mmol/L (ref 3.5–5.1)
Sodium: 136 mmol/L (ref 135–145)

## 2019-09-13 LAB — CBC WITH DIFFERENTIAL/PLATELET
Abs Immature Granulocytes: 0.18 10*3/uL — ABNORMAL HIGH (ref 0.00–0.07)
Basophils Absolute: 0.1 10*3/uL (ref 0.0–0.1)
Basophils Relative: 0 %
Eosinophils Absolute: 0.6 10*3/uL — ABNORMAL HIGH (ref 0.0–0.5)
Eosinophils Relative: 2 %
HCT: 27.8 % — ABNORMAL LOW (ref 39.0–52.0)
Hemoglobin: 8.8 g/dL — ABNORMAL LOW (ref 13.0–17.0)
Immature Granulocytes: 1 %
Lymphocytes Relative: 14 %
Lymphs Abs: 3.5 10*3/uL (ref 0.7–4.0)
MCH: 20.9 pg — ABNORMAL LOW (ref 26.0–34.0)
MCHC: 31.7 g/dL (ref 30.0–36.0)
MCV: 65.9 fL — ABNORMAL LOW (ref 80.0–100.0)
Monocytes Absolute: 1.4 10*3/uL — ABNORMAL HIGH (ref 0.1–1.0)
Monocytes Relative: 6 %
Neutro Abs: 19.2 10*3/uL — ABNORMAL HIGH (ref 1.7–7.7)
Neutrophils Relative %: 77 %
Platelets: UNDETERMINED 10*3/uL (ref 150–400)
RBC: 4.22 MIL/uL (ref 4.22–5.81)
RDW: 24.2 % — ABNORMAL HIGH (ref 11.5–15.5)
WBC: 24.9 10*3/uL — ABNORMAL HIGH (ref 4.0–10.5)

## 2019-09-13 MED ORDER — DARBEPOETIN ALFA 100 MCG/0.5ML IJ SOSY
PREFILLED_SYRINGE | INTRAMUSCULAR | Status: AC
  Filled 2019-09-13: qty 0.5

## 2019-09-13 MED ORDER — BOOST / RESOURCE BREEZE PO LIQD CUSTOM
1.0000 | Freq: Three times a day (TID) | ORAL | Status: DC
  Administered 2019-09-16: 1 via ORAL

## 2019-09-13 MED ORDER — ALBUMIN HUMAN 25 % IV SOLN: 25.0000 g | Freq: Once | INTRAVENOUS | Status: AC

## 2019-09-13 MED ORDER — ALBUMIN HUMAN 25 % IV SOLN
INTRAVENOUS | Status: AC
  Administered 2019-09-13: 25 g via INTRAVENOUS
  Filled 2019-09-13: qty 100

## 2019-09-13 MED ORDER — MIDODRINE HCL 5 MG PO TABS
ORAL_TABLET | ORAL | Status: AC
  Administered 2019-09-13: 10 mg via ORAL
  Filled 2019-09-13: qty 2

## 2019-09-13 MED ORDER — HEPARIN SODIUM (PORCINE) 1000 UNIT/ML IJ SOLN
INTRAMUSCULAR | Status: AC
  Administered 2019-09-13: 2800 [IU]
  Filled 2019-09-13: qty 3

## 2019-09-13 NOTE — Progress Notes (Signed)
Chaplain responded to a consult for an AD. The chaplain left the brochure with the patient. The chaplain is available for follow-up if needed.  Brion Aliment Chaplain Resident For questions concerning this note please contact me by pager [phone]

## 2019-09-13 NOTE — Progress Notes (Signed)
PROGRESS NOTE    [name]  VQM:[id number] DOB: 02/17/1941 DOA: 09/03/2019 PCP: [name], MD    No chief complaint on file.   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 However in the preoperative area, patient noted to have rectal bleeding for which [facility] was consulted and advised that the procedure will need to be postponed.  Patient was subsequently admitted.    Assessment & Plan:   Principal Problem:   GI bleeding Active Problems:   Encounter for central line placement   ESRD on dialysis [facility])   Aortic stenosis   PVD (peripheral vascular disease) ([place])   Essential hypertension   Duodenal ulcer   Palliative care encounter   DNR (do not resuscitate) discussion   Goals of care, counseling/discussion   Dehiscence of amputation stump ([employer])   Chronic hypotension   SVT (supraventricular tachycardia) ([facility])   Ventricular ectopy  1 upper GI bleed secondary to duodenal ulcer/acute blood loss anemia Patient noted to have some rectal bleeding in the preop area prior to surgery and subsequently admitted.  Patient transfused a unit of packed red  blood cells 09/04/2019.  Plavix currently on hold.  Status post upper endoscopy 09/04/2019 that showed duodenal ulcer which was likely cause of patient's bleeding per GI.  Colonoscopy subsequently was not performed as patient also had a poor prep.  H&H stable at 8.8.  Continue PPI every 12 hours.  Continue to hold Plavix for now and could likely resume Plavix once permanent catheter has been placed.  Follow.   2.  End-stage renal disease on hemodialysis Patient noted per nephrology that dressing from wound VAC currently over patient's AV graft.  Per nephrology thigh graft not accessible for dialysis and assess patient will need a tunneled dialysis catheter.  Patient seen by IR and underwent exchange of left IJ central line for triple-lumen HD catheter by Dr. Laurence Ferrari.  Patient had hemodialysis the evening of 09/10/2019.  Per nephrology patient will need permanent access prior to discharge.  Per nephrology.    3.  Progressive gangrenous necrotic changes with stump dehiscence of left transtibial amputation site Patient seen by orthopedics.  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  Currently on Prevena boot.  Per orthopedics.  4.  Leukocytosis Likely secondary to problem #3.  Worsening leukocytosis.  Status post left AKA.  Patient started on IV Zyvox.  Repeat blood cultures pending with no growth to date.  Due to worsening leukocytosis ID consulted who recommended discontinuation of antibiotics, MRI of the right foot, and consideration for CT chest abdomen and pelvis if worsening WBC and no cause found.  Leukocytosis fluctuating.  Antibiotics discontinued per ID.  ID have signed off.  Follow.   5.  Chronic diastolic heart failure Compensated.  On hemodialysis.  Per nephrology.  6.  Chronic hypotension Continue midodrine.   7. SVT Patient noted to have a run of  SVT with heart rates as high as the 200s in the PACU postoperatively on 09/08/2019.  Patient received a dose  of IV metoprolol and SVT broken and heart rate in the 70s.  Patient started on amiodarone and dose increased to 200 mg daily per cardiology for ventricular ectopy.  Outpatient follow-up with cardiology.   8.  Ventricular ectopy Patient started on amiodarone per cardiology.  Amiodarone dose increased to 200 mg daily per cardiology.  Outpatient follow-up with cardiology.  Follow.  9.  Severe Aortic stenosis Noted on TEE during previous admission.  Try to avoid hypotension.  Not a candidate for TAVR.  Cardiology was following and have signed off.  10.  Prolonged QTC Repeat EKG with QTC of 509.  Keep magnesium > 2.  Keep potassium greater > 4.  11.  Iron deficiency anemia/anemia of chronic disease H&H stable at 8.8.  Follow.  12.  History of CVA Plavix currently on hold.  Could potentially likely resume Plavix after permanent catheter has been placed before discharge.    13.  Generalized deconditioning Patient overall with a poor prognosis.  Palliative care has assessed and followed patient.  Patient now with partial code.  Patient wishes to have CPR and ACLS medication but no intubation or shock.  PT/OT.  Needs SNF.     DVT prophylaxis: Recent GI bleed.  Avoiding chemical prophylaxis.  SCDs. Code Status: Partial Family Communication: Updated patient, no family at bedside.  Disposition:   Status is: Inpatient    Dispo: The patient is from: SNF              Anticipated d/c is to: SNF              Anticipated d/c date is: 1-2 days.              Patient currently not medically stable for discharge.  Patient with a worsening leukocytosis post amputation.  Needs tunneled dialysis access catheter as dressing over thigh AV graft per nephrology which was done  09/10/2019.  Needs tunneled dialysis catheter prior to discharge.        Consultants:   Gastroenterology: Dr. Tarri Glenn 09/03/2019  Nephrology: Dr.Schertz 09/03/2019  Palliative care: Lindell Spar, NP 09/04/2019  Cardiology: Dr.  Lovena Le 09/04/2019  Domenic Moras, FNP 09/07/2019  Orthopedics: Dr. Sharol Given  ID: Dr. Tommy Medal 09/10/2019  Procedures:   Chest x-ray 09/03/2019  Upper endoscopy 09/04/2019--per Dr. Tarri Glenn  Left above-the-knee amputation per Dr. Sharol Given 09/08/2019 EGD on 09/04/2019 Impression: - Duodenal ulcer may be the cause of recent overt  GI bleeding. 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 Biopsied. - Erosive gastropathy with no bleeding and no  stigmata of recent bleeding. Biopsied. - A single non-bleeding angioectasia in the  duodenum. - Small hiatal hernia. - Unable to proceed with colonoscopy as planned due  to incomplete preparation. Patient had formed stool  on last bowel movement. Recommendation: - Return patient to hospital ward for ongoing care. - Clear liquid diet. Advance as tolerated. - No aspirin, ibuprofen, naproxen, or other  non-steroidal anti-inflammatory drugs. - Patient has a prolonged QT. Discussed with Dr.  Starla Link who agreed with trial of pantoprazole 40 mg  IV BID with close monitoring of QTc. - Will defer colonoscopy at this time given the  challenges that the patient has prepping for the  procedure. Duodenal ulcer may be the source of  bleeding. - Continue serial hgb/hct with transfusion as  indicated.  Exchange of left IJ central line for triple-lumen HD catheter--per IR Dr.  Laurence Ferrari  09/10/2019  MRI right foot 09/10/2019  Antimicrobials:   IV Ancef 09/08/2019 preoperatively  IV Zyvox 09/08/2019>>>>> 09/10/2019   Subjective: Patient in hemodialysis.  Denies any chest pain or shortness of breath.   Objective: Vitals:   09/13/19 1030 09/13/19 1100 09/13/19 1130 09/13/19 1135  BP: (!) 91/38 (!) 85/33 (!) 85/37 (!) 92/44  Pulse: 85 95 70 82  Resp:    18  Temp:    98.4 F (36.9 C)  TempSrc:    Oral  SpO2:    100%  Weight:      Height:        Intake/Output Summary (Last 24 hours) at 09/13/2019 1437 Last data filed at 09/13/2019 1135 Gross per 24 hour  Intake 100 ml  Output -284 ml  Net 384 ml   Filed Weights   09/12/19 0500 09/13/19 0127 09/13/19 0715  Weight: 54.9 kg 55.9 kg 59.9 kg    Examination:  General exam: NAD Respiratory system: Lungs clear to auscultation anterior lung fields.  No wheezes, no crackles, no rhonchi.  Cardiovascular system: RRR with grade 5-2/8 systolic ejection murmur.  No JVD.  No lower extremity edema.  Gastrointestinal system: Abdomen is nontender, nondistended, soft, positive bowel sounds.  No rebound.  No guarding.  Central nervous system: Alert and oriented. No focal neurological deficits. Extremities: Status post left BKA with wound VAC in place.  Status post right transmetatarsal amputation. Skin: No rashes, lesions or ulcers Psychiatry: Judgement and insight appear normal. Mood & affect appropriate.     Data Reviewed: I have personally reviewed following labs and imaging studies  CBC: Recent Labs  Lab 09/09/19 0500 09/10/19 0500 09/11/19 0752 09/12/19 0434 09/13/19 0506  WBC 23.6* 27.5* 21.9* 22.5* 24.9*  NEUTROABS 21.5* 21.6* 18.6* 17.9* 19.2*  HGB 8.2* 8.1* 8.0* 7.9* 8.8*  HCT 26.4* 25.1* 24.9* 24.9* 27.8*  MCV 66.8* 65.5* 64.5* 64.8* 65.9*  PLT 263 306 302 302 PLATELET CLUMPS NOTED ON SMEAR, UNABLE TO ESTIMATE    Basic Metabolic Panel: Recent Labs  Lab 09/09/19 0500 09/10/19 0500 09/11/19 0752  09/12/19 0434 09/13/19 0506  NA 138 137 136 137 136  K 3.9 4.3 3.7 4.0 5.1  CL 103 100 99 100 103  CO2 25 23 28 27 22   GLUCOSE 102* 91 116* 92 83  BUN 29* 36* 14 20 28*  CREATININE 5.42* 6.79* 3.36* 4.50* 5.86*  CALCIUM 7.7* 7.8* 7.4* 7.9* 7.6*  MG 1.8 2.4  --   --   --   PHOS 3.6 3.8 2.4* 3.4 4.0    GFR: Estimated Creatinine Clearance: 9.1 mL/min (A) (by C-G formula based on SCr of 5.86 mg/dL (H)).  Liver Function Tests: Recent Labs  Lab 09/09/19 0500 09/10/19 0500 09/11/19 0752 09/12/19 0434 09/13/19 0506  ALBUMIN 1.6* 1.5* 1.5* 1.5* 1.5*    CBG: Recent Labs  Lab 09/08/19 1117  GLUCAP 105*     Recent Results (from the past 240 hour(s))  MRSA PCR Screening     Status: None   Collection Time: 09/06/19  4:55 AM   Specimen: Nasopharyngeal  Result Value Ref Range Status   MRSA by PCR NEGATIVE NEGATIVE Final    Comment:        The GeneXpert MRSA Assay (FDA approved for NASAL specimens only), is one component of a comprehensive MRSA colonization surveillance program. It is not intended to diagnose MRSA infection nor to guide or monitor treatment for MRSA infections. Performed at [facility], [place] [address]  [place], [place] [address]   Surgical pcr screen     Status: None   Collection Time: 09/07/19  7:45 PM   Specimen: Nasal Mucosa; Nasal Swab  Result Value Ref Range Status   MRSA, PCR NEGATIVE NEGATIVE Final   Staphylococcus aureus NEGATIVE NEGATIVE Final    Comment: (NOTE) The Xpert SA Assay (FDA approved for NASAL specimens in patients 93 years of age and older), is one component of a comprehensive surveillance program. It is not intended to diagnose infection nor to guide or monitor treatment. Performed at [facility], [place] [address] [place], [place] [address]   Culture, blood (Routine X 2) w Reflex to ID Panel     Status: None (Preliminary result)   Collection Time: 09/10/19  9:20 AM   Specimen: BLOOD RIGHT HAND  Result Value Ref  Range Status   Specimen Description BLOOD RIGHT HAND  Final   Special Requests   Final    BOTTLES DRAWN AEROBIC ONLY Blood Culture results may not be optimal due to an inadequate volume of blood received in culture bottles   Culture   Final    NO GROWTH 3 DAYS Performed at [facility], [place] [address] [place], [place] [address]    Report Status PENDING  Incomplete  Culture, blood (Routine X 2) w Reflex to ID Panel     Status: None (Preliminary result)   Collection Time: 09/10/19 10:15 AM   Specimen: BLOOD RIGHT ARM  Result Value Ref Range Status   Specimen Description BLOOD RIGHT ARM  Final   Special Requests   Final    BOTTLES DRAWN AEROBIC ONLY Blood Culture adequate volume   Culture   Final    NO GROWTH 3 DAYS Performed at [facility], [place] [address], [place], [place] [address]    Report Status PENDING  Incomplete         Radiology Studies: No results found.      Scheduled Meds: . amiodarone  200 mg Oral Daily  . vitamin C  500 mg Oral Q24H  . atorvastatin  40 mg Oral q1800  . B-complex with vitamin C  1 tablet Oral Daily  . Chlorhexidine Gluconate Cloth  6 each Topical Q0600  . cholecalciferol  1,000 Units Oral Daily  . cinacalcet  60 mg Oral QHS  . darbepoetin (ARANESP) injection - DIALYSIS  100 mcg Intravenous Q Mon-HD  . docusate sodium  100 mg Oral BID  . feeding supplement (NEPRO CARB STEADY)  237 mL Oral TID BM  . feeding supplement (PRO-STAT SUGAR FREE 64)  30 mL Oral BID  . melatonin  3 mg Oral QHS  . midodrine  10 mg Oral TID WC  . pantoprazole  40 mg Oral BID  . sodium chloride flush  10-40 mL Intracatheter Q12H  . sodium chloride flush  3 mL Intravenous Q12H  . sodium chloride flush  3 mL Intravenous Q12H   Continuous Infusions: . sodium chloride Stopped (09/04/19 1236)  . ferric gluconate (FERRLECIT/NULECIT) IV Stopped (09/13/19 1018)     LOS: 10 days    Time spent: 35 minutes    Irine Seal, MD [facility]   To contact the attending provider between 7A-7P or the covering provider during after hours 7P-7A, please log into the web site [email or web address] and access using universal [place] password for that web site. If you do not have the password, please call the hospital operator.  09/13/2019, 2:37 PM

## 2019-09-13 NOTE — Progress Notes (Signed)
[facility] Progress Note   Dialysis Orders: [facility]- MWF- 4 hours-  Thigh AVG-  BFR 400/DFR800-  2 K bath as OP-  EDW 62.  Gets heparin per tx-  2000 units per hour, epo 4000 and sensipar 60 mg.  Last hgb there was 4/26  9.9   Assessment/ Plan:   1. GIB - EGD w/duodenal bulb ulcer. Gastric bx without malignancy or H. pylori.  PPI per primary team.  Hemoglobin relatively stable at this time 2. 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  Wound VAC in place. Graft still has a good bruit this am. 3. Ventricular ectopy - asymptomatic.  On amiodarone orally 4. ESRD- On HD MWF.  Tolerating dialysis well with no issues.  Continue to per his home schedule.  Absolutely needs midodrine before HD. Seen on HD this AM (5/17) BP low but asymp -> will give Albumin, currently UF off, mentating well.  5. Dialysis Access: Left lower extremity thigh graft unable to access because of wound VAC.  Has temporary catheter because of leukocytosis.  Would proceed with tunneled dialysis catheter when able (WBC still markedly elevated). 6. Hypotension/volume-no signs of significant volume overload on midodrine TID.  7. Anemiaof CKD-Hgb stable in 8s  s/p 1unit pRBC.  Status post iron for low T sat of 11%.  Continue ESA .  8. Secondary Hyperparathyroidism -calcium and phosphorus acceptable.  Continue sensipar- does not get vit D with HD-phosphorus slightly low  (not on binder)  Continue to monitor 9. Nutrition- Renal diet w/fluid restrictions  Subjective:   Patient has no complaints today.  Eating breakfast   Objective:   BP (!) 78/47   Pulse 87   Temp 98.1 F (36.7 C) (Oral)   Resp 12   Ht 5' 5.98" (1.676 m)   Wt 59.9 kg   SpO2 94%   BMI 21.32 kg/m   Intake/Output Summary (Last 24 hours) at 09/13/2019 0901 Last data filed at 09/12/2019 2053 Gross per 24 hour  Intake 220 ml  Output -  Net 220 ml   Weight change: 1 kg  Physical Exam: General:chronically  ill appearing, frail male in NAD Heart:RRR, +4/6 systolic murmur Lungs:CTA anteriolaterally, bilateral chest rise Abdomen:soft, NTND Extremities:L AKA with wound vac  Dialysis Access: L thigh AVG +b but as above dressing over AVG, LIJ temp cath  Imaging: No results found.  Labs: BMET Recent Labs  Lab 09/07/19 0110 09/09/19 0500 09/10/19 0500 09/11/19 0752 09/12/19 0434 09/13/19 0506  NA 140 138 137 136 137 136  K 3.8 3.9 4.3 3.7 4.0 5.1  CL 103 103 100 99 100 103  CO2 24 25 23 28 27 22   GLUCOSE 89 102* 91 116* 92 83  BUN 37* 29* 36* 14 20 28*  CREATININE 7.05* 5.42* 6.79* 3.36* 4.50* 5.86*  CALCIUM 8.2* 7.7* 7.8* 7.4* 7.9* 7.6*  PHOS  --  3.6 3.8 2.4* 3.4 4.0   CBC Recent Labs  Lab 09/10/19 0500 09/11/19 0752 09/12/19 0434 09/13/19 0506  WBC 27.5* 21.9* 22.5* 24.9*  NEUTROABS 21.6* 18.6* 17.9* 19.2*  HGB 8.1* 8.0* 7.9* 8.8*  HCT 25.1* 24.9* 24.9* 27.8*  MCV 65.5* 64.5* 64.8* 65.9*  PLT 306 302 302 PLATELET CLUMPS NOTED ON SMEAR, UNABLE TO ESTIMATE    Medications:    . amiodarone  200 mg Oral Daily  . vitamin C  500 mg Oral Q24H  . atorvastatin  40 mg Oral q1800  . B-complex with vitamin C  1 tablet Oral  Daily  . Chlorhexidine Gluconate Cloth  6 each Topical Q0600  . cholecalciferol  1,000 Units Oral Daily  . cinacalcet  60 mg Oral QHS  . darbepoetin (ARANESP) injection - DIALYSIS  100 mcg Intravenous Q Mon-HD  . docusate sodium  100 mg Oral BID  . feeding supplement (NEPRO CARB STEADY)  237 mL Oral TID BM  . feeding supplement (PRO-STAT SUGAR FREE 64)  30 mL Oral BID  . melatonin  3 mg Oral QHS  . midodrine  10 mg Oral TID WC  . pantoprazole  40 mg Oral BID  . sodium chloride flush  10-40 mL Intracatheter Q12H  . sodium chloride flush  3 mL Intravenous Q12H  . sodium chloride flush  3 mL Intravenous Q12H      Otelia Santee, MD 09/13/2019, 9:01 AM

## 2019-09-13 NOTE — TOC Progression Note (Addendum)
Transition of Care (TOC) - Progression Note    Patient Details  Name: Joseph IDDINGS Sr. MRN: [id number] Date of Birth: 1941-03-22  Transition of Care [facility]) CM/SW [place], LCSW Phone Number: 09/13/2019, 11:23 AM  Clinical Narrative:    11:23am-CSW spoke with [facility] and [facility]; they are still reviewing referral and will contact CSW back.  4pm-[facility] able to accept patient. [facility] unable to accept patient.    Expected Discharge Plan: Skilled Nursing Facility Barriers to Discharge: Continued Medical Work up  Expected Discharge Plan and Services Expected Discharge Plan: [place] In-house Referral: Clinical Social Work   Post Acute Care Choice: [place] Living arrangements for the past 2 months: [place]                                       Social Determinants of Health (SDOH) Interventions    Readmission Risk Interventions Readmission Risk Prevention Plan 09/10/2019 09/08/2019 09/08/2019  Transportation Screening Complete - Complete  PCP or Specialist Appt within 3-5 Days - - Complete  HRI or [place] - - Complete  Social Work Consult for [place] Planning/Counseling - - Complete  Palliative Care Screening - Complete Not Applicable  Medication Review Press photographer) - - Complete  Some recent data might be hidden

## 2019-09-13 NOTE — Progress Notes (Addendum)
Nutrition Follow-up  DOCUMENTATION CODES:   Severe malnutrition in context of chronic illness  INTERVENTION:  Provide Boost Breeze po TID, each supplement provides 250 kcal and 9 grams of protein.  Continue 30 ml Prostat po BID, each supplement provides 100 kcal and 15 grams of protein.   Continue Magic cup TID with meals, each supplement provides 290 kcal and 9 grams of protein  Encourage adequate PO intake.   NUTRITION DIAGNOSIS:   Severe Malnutrition related to chronic illness(ESRD on HD, CHF, non-healing wounds) as evidenced by severe fat depletion, severe muscle depletion, percent weight loss(22.7% weight loss in 7 months); ongoing  GOAL:   Patient will meet greater than or equal to 90% of their needs; progressing  MONITOR:   PO intake, Supplement acceptance, Labs, Weight trends, Skin, I & O's  REASON FOR ASSESSMENT:   Malnutrition Screening Tool    ASSESSMENT:   79 year old male who presented on 5/07 with left abduction wound dehiscence and scheduled AKA. PMH of PVD, DM, ESRD on HD, CHF, HTN, HLD, s/p multiple finger amputations, s/p right transmetatarsal amputation, s/p left transtibial amputation. Surgery postponed and pt admitted with acute lower GI bleed.  5/08 - s/p EGD showing duodenal ulcer  5/12 - L AKA with wound VAC  Meal completion has been varied from 5-60%. Pt currently has Nepro shake and has been refusing them and reports not able to tolerate the shakes. RD to order Boost Breeze instead. Pt currently has Prostat ordered and has been consuming them. RD to continue with nutritional supplementation to aid in in caloric and protein needs. Labs and medications reviewed.   Diet Order:   Diet Order            Diet renal with fluid restriction Fluid restriction: 1200 mL Fluid; Room service appropriate? Yes; Fluid consistency: Thin  Diet effective now              EDUCATION NEEDS:   Education needs have been addressed  Skin:  Skin Assessment: Skin  Integrity Issues: Skin Integrity Issues:: Stage II, Incisions, Wound VAC Stage II: sacrum Unstageable: N/A Wound Vac: L thigh Incisions: L thigh  Last BM:  5/16  Height:   Ht Readings from Last 1 Encounters:  09/08/19 5' 5.98" (1.676 m)    Weight:   Wt Readings from Last 1 Encounters:  09/13/19 59.9 kg    BMI:  Body mass index is 21.32 kg/m.  Estimated Nutritional Needs:   Kcal:  1700-1900  Protein:  80-95 grams  Fluid:  1000 ml + UOP   Corrin Parker, MS, RD, LDN RD pager number/after hours weekend pager number on Amion.

## 2019-09-14 LAB — BASIC METABOLIC PANEL
Anion gap: 8 (ref 5–15)
BUN: 10 mg/dL (ref 8–23)
CO2: 28 mmol/L (ref 22–32)
Calcium: 7.4 mg/dL — ABNORMAL LOW (ref 8.9–10.3)
Chloride: 100 mmol/L (ref 98–111)
Creatinine, Ser: 3.18 mg/dL — ABNORMAL HIGH (ref 0.61–1.24)
GFR calc Af Amer: 21 mL/min — ABNORMAL LOW (ref >60)
GFR calc non Af Amer: 18 mL/min — ABNORMAL LOW (ref >60)
Glucose, Bld: 94 mg/dL (ref 70–99)
Potassium: 3.7 mmol/L (ref 3.5–5.1)
Sodium: 136 mmol/L (ref 135–145)

## 2019-09-14 LAB — CBC
HCT: 24.8 % — ABNORMAL LOW (ref 39.0–52.0)
Hemoglobin: 7.8 g/dL — ABNORMAL LOW (ref 13.0–17.0)
MCH: 20.6 pg — ABNORMAL LOW (ref 26.0–34.0)
MCHC: 31.5 g/dL (ref 30.0–36.0)
MCV: 65.6 fL — ABNORMAL LOW (ref 80.0–100.0)
Platelets: 309 10*3/uL (ref 150–400)
RBC: 3.78 MIL/uL — ABNORMAL LOW (ref 4.22–5.81)
RDW: 24.1 % — ABNORMAL HIGH (ref 11.5–15.5)
WBC: 20.6 10*3/uL — ABNORMAL HIGH (ref 4.0–10.5)
nRBC: 0.1 % (ref 0.0–0.2)

## 2019-09-14 MED ORDER — PANTOPRAZOLE SODIUM 40 MG PO PACK
40.0000 mg | PACK | Freq: Two times a day (BID) | ORAL | Status: DC
  Administered 2019-09-15 – 2019-09-20 (×11): 40 mg via ORAL
  Filled 2019-09-14 (×11): qty 20

## 2019-09-14 NOTE — Progress Notes (Signed)
Occupational Therapy Treatment Patient Details Name: Joseph BUNDA Sr. MRN: [id number] DOB: [birth year] Today's Date: 09/14/2019    History of present illness Pt is a 79 y/o male now s/p L transfemoral amputation on 5/12. On admission, pt incidentally found to have rectal bleeding. PMH including but not limited to R LE transmetatarsal amp, HTN, arthritis, CVA with residual L sided weakness, arthritis, anxiety, ESRD M/W/F, recent fall with R hip avulasion fx WBAT with no medical mgmt.  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   OT comments  Upon entering the room, pt supine in bed and eating while supine. OT offered to assist pt to EOB or into a chair for meal but he declined. Pt washing face with set up A but initially refusing and stating, " You do it". Pt declined self care tasks and therapeutic exercise this session as well. Pt reports pain in buttocks and he did assist therapist with repositioning. Pt utilized bed rail with min cuing for supervision to roll onto R side and therapist placing pillows for comfort. Pt remained in this position and declined further intervention. Call bell and all needed items within reach upon exiting the room.   Follow Up Recommendations  SNF;Supervision/Assistance - 24 hour    Equipment Recommendations  Other (comment)(defer to next venue)       Precautions / Restrictions Precautions Precautions: Fall;Other (comment) Precaution Comments: R TMA, new L transfemoral amputation, wound VAC Required Braces or Orthoses: Other Brace Other Brace: residual limb protector, wound vac Restrictions RLE Weight Bearing: Weight bearing as tolerated LLE Weight Bearing: Non weight bearing       Mobility Bed Mobility   Bed Mobility: Rolling Rolling: Supervision         General bed mobility comments: supervision with min cuing for technique         ADL either performed or assessed with clinical judgement   ADL Overall  ADL's : Needs assistance/impaired     Grooming: Bed level;Wash/dry face;Wash/dry hands;Set up             Vision Baseline Vision/History: No visual deficits Patient Visual Report: No change from baseline            Cognition Arousal/Alertness: Awake/alert Behavior During Therapy: Flat affect Overall Cognitive Status: No family/caregiver present to determine baseline cognitive functioning Area of Impairment: Following commands;Safety/judgement;Problem solving    Orientation Level: Person;Place;Time;Situation                      Pertinent Vitals/ Pain       Pain Assessment: Faces Faces Pain Scale: Hurts little more Pain Location: buttocks Pain Descriptors / Indicators: Discomfort Pain Intervention(s): Monitored during session;Repositioned  Home Living   Prior Functioning/Environment    Frequency  Min 2X/week        Progress Toward Goals  OT Goals(current goals can now be found in the care plan section)  Progress towards OT goals: Progressing toward goals  Acute Rehab OT Goals Patient Stated Goal: get stronger OT Goal Formulation: With patient Time For Goal Achievement: 09/28/19 Potential to Achieve Goals: Good  Plan Discharge plan remains appropriate       AM-PAC OT "6 Clicks" Daily Activity     Outcome Measure   Help from another person eating meals?: A Little Help from another person taking care of personal grooming?: A Lot Help from another person toileting, which includes using toliet, bedpan, or urinal?: A Lot Help from another person  bathing (including washing, rinsing, drying)?: A Lot Help from another person to put on and taking off regular upper body clothing?: A Lot Help from another person to put on and taking off regular lower body clothing?: A Lot 6 Click Score: 13    End of Session    OT Visit Diagnosis: Other abnormalities of gait and mobility (R26.89);Muscle weakness (generalized) (M62.81)   Activity Tolerance Patient  limited by pain;Patient limited by fatigue   Patient Left in bed;with call bell/phone within reach;with bed alarm set   Nurse Communication          Time: 1355-1405 OT Time Calculation (min): 10 min  Charges: OT General Charges $OT Visit: 1 Visit OT Treatments $Therapeutic Activity: 8-22 mins   Gypsy Decant MS, OTR/L 09/14/2019, 2:48 PM

## 2019-09-14 NOTE — Progress Notes (Signed)
PROGRESS NOTE    [name]  DXA:[id number] DOB: 08-09-1940 DOA: 09/03/2019 PCP: [name], MD    No chief complaint on file.   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 However in the preoperative area, patient noted to have rectal bleeding for which [facility] was consulted and advised that the procedure will need to be postponed.  Patient was subsequently admitted.    Assessment & Plan:   Principal Problem:   GI bleeding Active Problems:   Encounter for central line placement   ESRD on dialysis [facility])   Aortic stenosis   PVD (peripheral vascular disease) ([place])   Essential hypertension   Duodenal ulcer   Palliative care encounter   DNR (do not resuscitate) discussion   Goals of care, counseling/discussion   Dehiscence of amputation stump ([place])   Chronic hypotension   SVT (supraventricular tachycardia) ([facility])   Ventricular ectopy  1 upper GI bleed secondary to duodenal ulcer/acute blood loss anemia Patient noted to have some rectal bleeding in the preop area prior to surgery and subsequently admitted.  Patient transfused a unit of packed red  blood cells 09/04/2019.  Plavix currently on hold.  Status post upper endoscopy 09/04/2019 that showed duodenal ulcer which was likely cause of patient's bleeding per GI.  Colonoscopy subsequently was not performed as patient also had a poor prep.  H&H stable at 7.8.  Continue PPI every 12 hours.  Continue to hold Plavix for now and could likely resume Plavix once permanent catheter has been placed.  Follow.   2.  End-stage renal disease on hemodialysis Patient noted per nephrology that dressing from wound VAC currently over patient's AV graft.  Per nephrology thigh graft not accessible for dialysis and assess patient will need a tunneled dialysis catheter.  Patient seen by IR and underwent exchange of left IJ central line for triple-lumen HD catheter by Dr. Laurence Ferrari.  Patient had hemodialysis the evening of 09/10/2019.  Per nephrology patient will need permanent access prior to discharge.  Per nephrology.    3.  Progressive gangrenous necrotic changes with stump dehiscence of left transtibial amputation site Patient seen by orthopedics.  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  Currently on Prevena boot.  Outpatient follow-up with orthopedics.  Per orthopedics.  4.  Leukocytosis Likely secondary to problem #3.  Leukocytosis was fluctuating but slowly started to trend down. Status post left AKA.  Patient started on IV Zyvox which is subsequently been discontinued..  Repeat blood cultures pending with no growth to date.  Due to worsening leukocytosis ID consulted who recommended discontinuation of antibiotics, MRI of the right foot, and consideration for CT chest abdomen and pelvis if worsening WBC and no cause found.  ID have signed off.  Follow.   5.  Chronic diastolic heart failure Compensated.  On hemodialysis.  Per nephrology.  6.  Chronic hypotension Midodrine.    7.  SVT Patient noted to have a run of SVT with heart rates as high as the 200s in the PACU  postoperatively on 09/08/2019.  Patient received a dose of IV metoprolol and SVT broken and heart rate in the 70s.  Patient started on amiodarone and dose increased to 200 mg daily per cardiology for ventricular ectopy.  Outpatient follow-up with cardiology.   8.  Ventricular ectopy Patient started on amiodarone per cardiology.  Amiodarone dose increased to 200 mg daily per cardiology.  Outpatient follow-up with cardiology.  Follow.  9.  Severe Aortic stenosis Noted on TEE during previous admission.  Patient noted to not be a candidate for TAVR.  Avoid hypotension.  Cardiology was following but have signed off.  10.  Prolonged QTC Repeat EKG with QTC of 509.  Keep magnesium > 2.  Keep potassium greater > 4.  11.  Iron deficiency anemia/anemia of chronic disease H&H stable at 7.8.  Follow.  12.  History of CVA Plavix currently on hold.  Could potentially likely resume Plavix after permanent catheter has been placed before discharge.    13.  Generalized deconditioning Patient overall with a poor prognosis.  Palliative care has assessed and followed patient.  Patient now with partial code.  Patient wishes to have CPR and ACLS medication but no intubation or shock.  PT/OT.  Needs SNF.     DVT prophylaxis: Recent GI bleed.  Avoiding chemical prophylaxis.  SCDs. Code Status: Partial Family Communication: Updated patient, no family at bedside.  Disposition:   Status is: Inpatient    Dispo: The patient is from: SNF              Anticipated d/c is to: SNF              Anticipated d/c date is: 1-2 days.              Patient with a leukocytosis post amputation that is slowly trending down but still elevated.  Seen by ID and antibiotics discontinued.  Needs permanent dialysis catheter which is pending to be placed.        Consultants:   Gastroenterology: Dr. Tarri Glenn 09/03/2019  Nephrology: Dr.Schertz 09/03/2019  Palliative care: Lindell Spar, NP 09/04/2019  Cardiology: Dr. Lovena Le  09/04/2019  Domenic Moras, FNP 09/07/2019  Orthopedics: Dr. Sharol Given  ID: Dr. Tommy Medal 09/10/2019  Procedures:   Chest x-ray 09/03/2019  Upper endoscopy 09/04/2019--per Dr. Tarri Glenn  Left above-the-knee amputation per Dr. Sharol Given 09/08/2019 EGD on 09/04/2019 Impression: - Duodenal ulcer may be the cause of recent overt  GI bleeding. 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 Biopsied. - Erosive gastropathy with no bleeding and no  stigmata of recent bleeding. Biopsied. - A single non-bleeding angioectasia in the  duodenum. - Small hiatal hernia. - Unable to proceed with colonoscopy as planned due  to incomplete preparation. Patient had formed stool  on last bowel movement. Recommendation: - Return patient to hospital ward for ongoing care. - Clear liquid diet. Advance as tolerated. - No aspirin, ibuprofen, naproxen, or other  non-steroidal anti-inflammatory drugs. - Patient has a prolonged QT. Discussed with Dr.  Starla Link who agreed with trial of pantoprazole 40 mg  IV BID with close monitoring of QTc. - Will defer colonoscopy at this time given the  challenges that the patient has prepping for the  procedure. Duodenal ulcer may be the source of  bleeding. - Continue serial hgb/hct with transfusion as  indicated.  Exchange of left IJ central line for triple-lumen HD catheter--per IR Dr.  Laurence Ferrari 09/10/2019  MRI  right foot 09/10/2019  Antimicrobials:   IV Ancef 09/08/2019 preoperatively  IV Zyvox 09/08/2019>>>>> 09/10/2019   Subjective: Patient sleeping but arousable.  Denies chest pain or shortness of breath.  No lower extremity pain.  No nausea or vomiting.  Objective: Vitals:   09/13/19 1600 09/13/19 2000 09/13/19 2339 09/14/19 0410  BP: (!) 108/44  (!) 120/54 (!) 128/48  Pulse: 97  86 90  Resp: 19  16 11   Temp: 98.4 F (36.9 C) 97.8 F (36.6 C) 98.3 F (36.8 C) 98.9 F (37.2 C)  TempSrc: Oral Axillary Axillary Axillary  SpO2: 98%  95% 98%  Weight:    60.2 kg  Height:        Intake/Output Summary (Last 24 hours) at 09/14/2019 1123 Last data filed at 09/13/2019 1135 Gross per 24 hour  Intake -  Output -284 ml  Net 284 ml   Filed Weights   09/13/19 0127 09/13/19 0715 09/14/19 0410  Weight: 55.9 kg 59.9 kg 60.2 kg    Examination:  General exam: NAD Respiratory system: CTAB anterior lung fields.  No wheezes, no crackles, no rhonchi.  Cardiovascular system: Regular rate rhythm with grade 6-1/9 systolic ejection murmur.  No JVD.  No lower extremity edema.  Gastrointestinal system: Abdomen is soft, nontender, nondistended, positive bowel sounds.  No rebound.  No guarding.  Central nervous system: Alert and oriented. No focal neurological deficits. Extremities: Status post left BKA with wound VAC in place.  Status post right transmetatarsal amputation. Skin: No rashes, lesions or ulcers Psychiatry: Judgement and insight appear normal. Mood & affect appropriate.     Data Reviewed: I have personally reviewed following labs and imaging studies  CBC: Recent Labs  Lab 09/09/19 0500 09/09/19 0500 09/10/19 0500 09/11/19 0752 09/12/19 0434 09/13/19 0506 09/14/19 0416  WBC 23.6*   < > 27.5* 21.9* 22.5* 24.9* 20.6*  NEUTROABS 21.5*  --  21.6* 18.6* 17.9* 19.2*  --   HGB 8.2*   < > 8.1* 8.0* 7.9* 8.8* 7.8*  HCT 26.4*   < > 25.1* 24.9* 24.9* 27.8* 24.8*  MCV 66.8*   < > 65.5*  64.5* 64.8* 65.9* 65.6*  PLT 263   < > 306 302 302 PLATELET CLUMPS NOTED ON SMEAR, UNABLE TO ESTIMATE 309   < > = values in this interval not displayed.    Basic Metabolic Panel: Recent Labs  Lab 09/09/19 0500 09/09/19 0500 09/10/19 0500 09/11/19 0752 09/12/19 0434 09/13/19 0506 09/14/19 0416  NA 138   < > 137 136 137 136 136  K 3.9   < > 4.3 3.7 4.0 5.1 3.7  CL 103   < > 100 99 100 103 100  CO2 25   < > 23 28 27 22 28   GLUCOSE 102*   < > 91 116* 92 83 94  BUN 29*   < > 36* 14 20 28* 10  CREATININE 5.42*   < > 6.79* 3.36* 4.50* 5.86* 3.18*  CALCIUM 7.7*   < > 7.8* 7.4* 7.9* 7.6* 7.4*  MG 1.8  --  2.4  --   --   --   --   PHOS 3.6  --  3.8 2.4* 3.4 4.0  --    < > = values in this interval not displayed.    GFR: Estimated Creatinine Clearance: 16.8 mL/min (A) (by C-G formula based on SCr of 3.18 mg/dL (H)).  Liver Function Tests: Recent Labs  Lab 09/09/19 0500 09/10/19 0500 09/11/19 5093  09/12/19 0434 09/13/19 0506  ALBUMIN 1.6* 1.5* 1.5* 1.5* 1.5*    CBG: Recent Labs  Lab 09/08/19 1117  GLUCAP 105*     Recent Results (from the past 240 hour(s))  MRSA PCR Screening     Status: None   Collection Time: 09/06/19  4:55 AM   Specimen: Nasopharyngeal  Result Value Ref Range Status   MRSA by PCR NEGATIVE NEGATIVE Final    Comment:        The GeneXpert MRSA Assay (FDA approved for NASAL specimens only), is one component of a comprehensive MRSA colonization surveillance program. It is not intended to diagnose MRSA infection nor to guide or monitor treatment for MRSA infections. Performed at [facility], [place] [address], [place], [place] [address]   Surgical pcr screen     Status: None   Collection Time: 09/07/19  7:45 PM   Specimen: Nasal Mucosa; Nasal Swab  Result Value Ref Range Status   MRSA, PCR NEGATIVE NEGATIVE Final   Staphylococcus aureus NEGATIVE NEGATIVE Final    Comment: (NOTE) The Xpert SA Assay (FDA approved for NASAL specimens in  patients 67 years of age and older), is one component of a comprehensive surveillance program. It is not intended to diagnose infection nor to guide or monitor treatment. Performed at [facility], [place] [address], [place], [place] [address]   Culture, blood (Routine X 2) w Reflex to ID Panel     Status: None (Preliminary result)   Collection Time: 09/10/19  9:20 AM   Specimen: BLOOD RIGHT HAND  Result Value Ref Range Status   Specimen Description BLOOD RIGHT HAND  Final   Special Requests   Final    BOTTLES DRAWN AEROBIC ONLY Blood Culture results may not be optimal due to an inadequate volume of blood received in culture bottles   Culture   Final    NO GROWTH 4 DAYS Performed at [facility], [place] [address], [place], [place] [address]    Report Status PENDING  Incomplete  Culture, blood (Routine X 2) w Reflex to ID Panel     Status: None (Preliminary result)   Collection Time: 09/10/19 10:15 AM   Specimen: BLOOD RIGHT ARM  Result Value Ref Range Status   Specimen Description BLOOD RIGHT ARM  Final   Special Requests   Final    BOTTLES DRAWN AEROBIC ONLY Blood Culture adequate volume   Culture   Final    NO GROWTH 4 DAYS Performed at [facility], [place] [address] [place],  [address]    Report Status PENDING  Incomplete         Radiology Studies: No results found.      Scheduled Meds: . amiodarone  200 mg Oral Daily  . vitamin C  500 mg Oral Q24H  . atorvastatin  40 mg Oral q1800  . B-complex with vitamin C  1 tablet Oral Daily  . Chlorhexidine Gluconate Cloth  6 each Topical Q0600  . cholecalciferol  1,000 Units Oral Daily  . cinacalcet  60 mg Oral QHS  . darbepoetin (ARANESP) injection - DIALYSIS  100 mcg Intravenous Q Mon-HD  . docusate sodium  100 mg Oral BID  . feeding supplement  1 Container Oral TID BM  . feeding supplement (PRO-STAT SUGAR FREE 64)  30 mL Oral BID  . melatonin  3 mg Oral QHS  . midodrine  10 mg Oral TID WC  .  pantoprazole  40 mg Oral  BID  . sodium chloride flush  10-40 mL Intracatheter Q12H  . sodium chloride flush  3 mL Intravenous Q12H  . sodium chloride flush  3 mL Intravenous Q12H   Continuous Infusions: . sodium chloride Stopped (09/04/19 1236)  . ferric gluconate (FERRLECIT/NULECIT) IV Stopped (09/13/19 1018)     LOS: 11 days    Time spent: 35 minutes    Irine Seal, MD [facility]   To contact the attending provider between 7A-7P or the covering provider during after hours 7P-7A, please log into the web site [email or web address] and access using universal [place] password for that web site. If you do not have the password, please call the hospital operator.  09/14/2019, 11:23 AM

## 2019-09-14 NOTE — Progress Notes (Signed)
Physical Therapy Treatment Patient Details Name: Joseph BARDON Sr. MRN: [id number] DOB: 13-Jun-1940 Today's Date: 09/14/2019    History of Present Illness Pt is a 79 y/o male now s/p L transfemoral amputation on 5/12. On admission, pt incidentally found to have rectal bleeding. PMH including but not limited to R LE transmetatarsal amp, HTN, arthritis, CVA with residual L sided weakness, arthritis, anxiety, ESRD M/W/F, recent fall with R hip avulasion fx WBAT with no medical mgmt.  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    PT Comments    Pt engaged well during PT session today, tolerating EOB sitting, rolling and bed mobility, and LE exercises all of which required min-max PT assist to complete. Pt required very increased time to move to and from EOB this day, due to significant buttocks pain. Pt also with impaired sitting balance, requiring increased time and assist from PT to gain upright sitting with use of UEs. Pt declined OOB activity this day, states "I got stuck in the chair last time". PT positioned pt in R sidelying with pillows bolstering him in order to offweight both L residual limb and buttocks. PT to continue to follow acutely .     Follow Up Recommendations  SNF     Equipment Recommendations  None recommended by PT    Recommendations for Other Services       Precautions / Restrictions Precautions Precautions: Fall;Other (comment) Precaution Comments: R TMA, new L transfemoral amputation, wound VAC Required Braces or Orthoses: Other Brace Other Brace: wound vac Restrictions Weight Bearing Restrictions: Yes RLE Weight Bearing: Weight bearing as tolerated LLE Weight Bearing: Non weight bearing    Mobility  Bed Mobility Overal bed mobility: Needs Assistance Bed Mobility: Rolling;Sidelying to Sit;Sit to Supine Rolling: Min assist Sidelying to sit: HOB elevated;Max assist   Sit to supine: Max assist;HOB elevated   General bed  mobility comments: Min assist for rolling towards R for completion of roll onto sidelying, verbal cuing for use of bedrail to assist in roll. Max assist for sidelying to sit for trunk elevation, LE translation to EOB with use of bed pad. Step-wise scoots utilized due to pt's severe buttocks pain which limited tolerance for scooting. Max assist return to supine for LE and trunk management, scooting up in bed wit huse of bed pads and boost function. PT encouraged pt to assist with pushing through RLE and performing semi-bridge during scoot, also less shear force on pt's sore buttocks.  Transfers                 General transfer comment: unable this day, pt declines  Ambulation/Gait             General Gait Details: unable   Stairs             Wheelchair Mobility    Modified Rankin (Stroke Patients Only)       Balance Overall balance assessment: Needs assistance Sitting-balance support: Feet supported;Bilateral upper extremity supported Sitting balance-Leahy Scale: Fair Sitting balance - Comments: significant difficulty gaining sitting balance this day, requiring PT assist to right balance and encouraged use of bedrail in R hand to gain balance. Sat EOB ~5 minutes before fatiguing       Standing balance comment: unable to assess today                            Cognition Arousal/Alertness: Awake/alert Behavior During Therapy:  WFL for tasks assessed/performed Overall Cognitive Status: No family/caregiver present to determine baseline cognitive functioning Area of Impairment: Following commands;Safety/judgement;Problem solving                 Orientation Level: Person;Place;Time;Situation   Memory: Decreased recall of precautions;Decreased short-term memory Following Commands: Follows one step commands with increased time;Follows one step commands consistently     Problem Solving: Slow processing;Decreased initiation;Difficulty  sequencing;Requires verbal cues;Requires tactile cues General Comments: Pt slow to initiate mobility, states "give me some time, wait a minute" frequently when PT assisting with mobility      Exercises General Exercises - Lower Extremity Heel Slides: AROM;Right;10 reps;Supine Straight Leg Raises: AAROM;Right;10 reps;Supine Amputee Exercises Gluteal Sets: AROM;Both;10 reps;Supine Hip Extension: AROM;Left;10 reps;Supine Hip ABduction/ADduction: AROM;10 reps;Supine;Left Hip Flexion/Marching: AROM;Left;10 reps;Supine    General Comments General comments (skin integrity, edema, etc.): alarm for PVCs during EOB mobility      Pertinent Vitals/Pain Pain Assessment: Faces Faces Pain Scale: Hurts even more Pain Location: buttocks Pain Descriptors / Indicators: Discomfort Pain Intervention(s): Limited activity within patient's tolerance;Monitored during session;Repositioned;Premedicated before session    Home Living                      Prior Function            PT Goals (current goals can now be found in the care plan section) Acute Rehab PT Goals Patient Stated Goal: get stronger PT Goal Formulation: With patient Time For Goal Achievement: 09/19/19 Potential to Achieve Goals: Fair Progress towards PT goals: Progressing toward goals    Frequency    Min 2X/week      PT Plan Current plan remains appropriate    Co-evaluation              AM-PAC PT "6 Clicks" Mobility   Outcome Measure  Help needed turning from your back to your side while in a flat bed without using bedrails?: A Little Help needed moving from lying on your back to sitting on the side of a flat bed without using bedrails?: A Lot Help needed moving to and from a bed to a chair (including a wheelchair)?: Total Help needed standing up from a chair using your arms (e.g., wheelchair or bedside chair)?: Total Help needed to walk in hospital room?: Total Help needed climbing 3-5 steps with a  railing? : Total 6 Click Score: 9    End of Session   Activity Tolerance: Patient limited by fatigue;Patient limited by pain Patient left: in bed;with bed alarm set;with SCD's reapplied;with call bell/phone within reach Nurse Communication: Mobility status PT Visit Diagnosis: Other abnormalities of gait and mobility (R26.89);Muscle weakness (generalized) (M62.81) Pain - Right/Left: Left Pain - part of body: Leg     Time: 3845-3646 PT Time Calculation (min) (ACUTE ONLY): 22 min  Charges:  $Therapeutic Activity: 8-22 mins                     Lonie Rummell E, PT Acute Rehabilitation Services Pager [phone]  Office [phone]    Natiya Seelinger D Elonda Husky 09/14/2019, 4:28 PM

## 2019-09-14 NOTE — Progress Notes (Signed)
[facility] Progress Note   Dialysis Orders: [facility]- MWF- 4 hours- Thigh AVG- BFR 400/DFR800- 2 K bath as OP- EDW 62. Gets heparin per tx- 2000 units per hour, epo 4000 and sensipar 60 mg. Last hgb there was 4/26 9.9   Assessment/ Plan:   1. GIB- EGD w/duodenal bulb ulcer. Gastric bx without malignancy or H. pylori. PPI per primary team. Hemoglobin relatively stable at this time 2. 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 Wound VAC in place. Graft still has a good bruit this am. 3. Ventricular ectopy- asymptomatic. On amiodarone orally 4. ESRD- On HD MWF.Tolerating dialysis well with no issues. Continue to per his home schedule. Absolutely needs midodrine before HD. Last HD 5/17 Not able to pull much yest (284 net neg); I don't think he has much fluid onboard.  Plan next HD tomorrow.  5. Dialysis Access: Left lower extremity thigh graft unable to access because of wound VAC. Has temporary catheter because of leukocytosis. Would proceed with tunneled dialysis catheter when able (WBC still markedly elevated). 6. Hypotension/volume-no signs of significant volume overload on midodrine TID.  7. Anemiaof CKD-Hgb stable in 8s s/p 1unit pRBC. Status post iron for low T sat of 11%. Continue ESA .  8. Secondary Hyperparathyroidism -calcium and phosphorus acceptable. Continue sensipar- does not get vit D with HD-phosphorus slightly low  (not on binder) Continue to monitor 9. Nutrition- Renal diet w/fluid restrictions  Subjective:   Patient has no complaints today. Denies f/c/n/v/dyspnea   Objective:   BP (!) 128/48 (BP Location: Right Arm)   Pulse 90   Temp 98.9 F (37.2 C) (Axillary)   Resp 11   Ht 5' 5.98" (1.676 m)   Wt 60.2 kg   SpO2 98%   BMI 21.43 kg/m   Intake/Output Summary (Last 24 hours) at 09/14/2019 0858 Last data filed at 09/13/2019 1135 Gross per 24 hour  Intake --  Output -284 ml  Net 284 ml    Weight change: 4 kg  Physical Exam: General:chronically ill appearing, frail male in NAD Heart:RRR, +8/2 systolic murmur Lungs:CTA anteriolaterally, bilateral chest rise Abdomen:soft, NTND Extremities:L AKA with wound vac Dialysis Access: L thigh AVG +b but as above dressing over AVG, LIJ temp cath  Imaging: No results found.  Labs: BMET Recent Labs  Lab 09/09/19 0500 09/10/19 0500 09/11/19 0752 09/12/19 0434 09/13/19 0506 09/14/19 0416  NA 138 137 136 137 136 136  K 3.9 4.3 3.7 4.0 5.1 3.7  CL 103 100 99 100 103 100  CO2 25 23 28 27 22 28   GLUCOSE 102* 91 116* 92 83 94  BUN 29* 36* 14 20 28* 10  CREATININE 5.42* 6.79* 3.36* 4.50* 5.86* 3.18*  CALCIUM 7.7* 7.8* 7.4* 7.9* 7.6* 7.4*  PHOS 3.6 3.8 2.4* 3.4 4.0  --    CBC Recent Labs  Lab 09/10/19 0500 09/10/19 0500 09/11/19 0752 09/12/19 0434 09/13/19 0506 09/14/19 0416  WBC 27.5*   < > 21.9* 22.5* 24.9* 20.6*  NEUTROABS 21.6*  --  18.6* 17.9* 19.2*  --   HGB 8.1*   < > 8.0* 7.9* 8.8* 7.8*  HCT 25.1*   < > 24.9* 24.9* 27.8* 24.8*  MCV 65.5*   < > 64.5* 64.8* 65.9* 65.6*  PLT 306   < > 302 302 PLATELET CLUMPS NOTED ON SMEAR, UNABLE TO ESTIMATE 309   < > = values in this interval not displayed.    Medications:    . amiodarone  200 mg  Oral Daily  . vitamin C  500 mg Oral Q24H  . atorvastatin  40 mg Oral q1800  . B-complex with vitamin C  1 tablet Oral Daily  . Chlorhexidine Gluconate Cloth  6 each Topical Q0600  . cholecalciferol  1,000 Units Oral Daily  . cinacalcet  60 mg Oral QHS  . darbepoetin (ARANESP) injection - DIALYSIS  100 mcg Intravenous Q Mon-HD  . docusate sodium  100 mg Oral BID  . feeding supplement  1 Container Oral TID BM  . feeding supplement (PRO-STAT SUGAR FREE 64)  30 mL Oral BID  . melatonin  3 mg Oral QHS  . midodrine  10 mg Oral TID WC  . pantoprazole  40 mg Oral BID  . sodium chloride flush  10-40 mL Intracatheter Q12H  . sodium chloride flush  3 mL Intravenous Q12H  .  sodium chloride flush  3 mL Intravenous Q12H      Otelia Santee, MD 09/14/2019, 8:58 AM

## 2019-09-14 NOTE — Progress Notes (Signed)
Renal Navigator updated to patient's OP HD clinic/[facility] to provide continuity of care.  Alphonzo Cruise, [place] Renal Navigator [phone]

## 2019-09-14 NOTE — Consult Note (Signed)
   [facility] CM Inpatient Consult   09/14/2019  RAYMAR JOINER Sr. 1940-05-12 [id number]  [place] Patient: Medicare NextGen  Patient screened for extreme high risk score for unplanned readmission score  and for hospitalizations to check if potential [employer] services are needed.  Review of patient's medical record reveals patient is for a skilled nursing facility [SNF] level of care.  Plan:  Will follow patient's  disposition to assess for post hospital care management needs. If patient goes to a [facility] affiliated SNF, will alert [facility] PAC nurse.  Will continue to follow.    For questions contact:   Natividad Brood, RN BSN [facility] Liaison  [phone] business mobile phone Toll free office [phone]  Fax number: [phone] [email or web address]

## 2019-09-15 DIAGNOSIS — K284 Chronic or unspecified gastrojejunal ulcer with hemorrhage: Secondary | ICD-10-CM

## 2019-09-15 LAB — CBC WITH DIFFERENTIAL/PLATELET
Abs Immature Granulocytes: 0.22 10*3/uL — ABNORMAL HIGH (ref 0.00–0.07)
Basophils Absolute: 0.1 10*3/uL (ref 0.0–0.1)
Basophils Relative: 0 %
Eosinophils Absolute: 0.6 10*3/uL — ABNORMAL HIGH (ref 0.0–0.5)
Eosinophils Relative: 3 %
HCT: 25.4 % — ABNORMAL LOW (ref 39.0–52.0)
Hemoglobin: 8 g/dL — ABNORMAL LOW (ref 13.0–17.0)
Immature Granulocytes: 1 %
Lymphocytes Relative: 15 %
Lymphs Abs: 3.4 10*3/uL (ref 0.7–4.0)
MCH: 20.9 pg — ABNORMAL LOW (ref 26.0–34.0)
MCHC: 31.5 g/dL (ref 30.0–36.0)
MCV: 66.3 fL — ABNORMAL LOW (ref 80.0–100.0)
Monocytes Absolute: 1.3 10*3/uL — ABNORMAL HIGH (ref 0.1–1.0)
Monocytes Relative: 6 %
Neutro Abs: 17.3 10*3/uL — ABNORMAL HIGH (ref 1.7–7.7)
Neutrophils Relative %: 75 %
Platelets: 349 10*3/uL (ref 150–400)
RBC: 3.83 MIL/uL — ABNORMAL LOW (ref 4.22–5.81)
RDW: 24.4 % — ABNORMAL HIGH (ref 11.5–15.5)
WBC: 22.9 10*3/uL — ABNORMAL HIGH (ref 4.0–10.5)
nRBC: 0.2 % (ref 0.0–0.2)

## 2019-09-15 LAB — CULTURE, BLOOD (ROUTINE X 2)
Culture: NO GROWTH
Culture: NO GROWTH
Special Requests: ADEQUATE

## 2019-09-15 LAB — GLUCOSE, CAPILLARY
Glucose-Capillary: 96 mg/dL (ref 70–99)
Glucose-Capillary: 120 mg/dL — ABNORMAL HIGH (ref 70–99)

## 2019-09-15 LAB — RENAL FUNCTION PANEL
Albumin: 1.7 g/dL — ABNORMAL LOW (ref 3.5–5.0)
Anion gap: 13 (ref 5–15)
BUN: 19 mg/dL (ref 8–23)
CO2: 27 mmol/L (ref 22–32)
Calcium: 7.7 mg/dL — ABNORMAL LOW (ref 8.9–10.3)
Chloride: 96 mmol/L — ABNORMAL LOW (ref 98–111)
Creatinine, Ser: 4.61 mg/dL — ABNORMAL HIGH (ref 0.61–1.24)
GFR calc Af Amer: 13 mL/min — ABNORMAL LOW (ref >60)
GFR calc non Af Amer: 11 mL/min — ABNORMAL LOW (ref >60)
Glucose, Bld: 95 mg/dL (ref 70–99)
Phosphorus: 3.5 mg/dL (ref 2.5–4.6)
Potassium: 4 mmol/L (ref 3.5–5.1)
Sodium: 136 mmol/L (ref 135–145)

## 2019-09-15 MED ORDER — MIDODRINE HCL 5 MG PO TABS
ORAL_TABLET | ORAL | Status: AC
  Administered 2019-09-15: 10 mg via ORAL
  Filled 2019-09-15: qty 2

## 2019-09-15 MED ORDER — HEPARIN SODIUM (PORCINE) 1000 UNIT/ML IJ SOLN
INTRAMUSCULAR | Status: AC
  Administered 2019-09-15: 2800 [IU]
  Filled 2019-09-15: qty 3

## 2019-09-15 NOTE — Consult Note (Signed)
Patient Status: [name] - In-pt  Assessment and Plan: Patient with history of ESRD on HD s/p nontunneled left IJ HD catheter placement in IR 09/10/2019, requiring tunneling of line secondary to malfunction (flowing slow on dialysis machine) and long term need.  Plan for image-guided tunneled HD catheter placement tentatively for tomorrow in IR. Patient will be NPO at midnight. Afebrile.  Risks and benefits discussed with the patient including, but not limited to bleeding, infection, vascular injury, pneumothorax which may require chest tube placement, air embolism or even death. All of the patient's questions were answered, patient is agreeable to proceed. Consent signed and in IR control room.  ______________________________________________________________________   History of Present Illness: Joseph Ewen. is a 79 y.o. 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 He has been admitted to [name] since 09/03/2019 for management of LLE gangrene and GI bleed. While admitted, patient required dialysis access and had a nontunneled left IJ HD catheter placed in IR 09/10/2019. Catheter flowing slowly on dialysis machine. Due to this and need for long term access, request is made for tunneled HD catheter placement.  IR requested by Dr. Augustin Coupe for possible image-guided tunneled HD catheter placement. Patient awake and alert laying in bed with no complaints at this time. Denies fever, chills, chest pain, dyspnea, abdominal pain, or headache.   Allergies and medications reviewed.   Review of Systems: A 12 point ROS discussed and pertinent positives are indicated in the HPI above.  All other systems are negative.  Review of Systems  Constitutional: Negative for chills and fever.  Respiratory: Negative for shortness of breath and wheezing.   Cardiovascular:  Negative for chest pain and palpitations.  Gastrointestinal: Negative for abdominal pain.  Neurological: Negative for headaches.  Psychiatric/Behavioral: Negative for behavioral problems and confusion.    Vital Signs: BP (!) 94/39 (BP Location: Right Leg)   Pulse 89   Temp 99.4 F (37.4 C) (Oral)   Resp 17   Ht 5' 5.98" (1.676 m)   Wt 123 lb 0.3 oz (55.8 kg)   SpO2 100%   BMI 19.87 kg/m   Physical Exam Vitals and nursing note reviewed.  Constitutional:      General: He is not in acute distress.    Appearance: Normal appearance.  Cardiovascular:     Rate and Rhythm: Normal rate and regular rhythm.     Heart sounds: Normal heart sounds. No murmur.  Pulmonary:     Effort: Pulmonary effort is normal. No respiratory distress.     Breath sounds: Normal breath sounds. No wheezing.  Skin:    General: Skin is warm and dry.  Neurological:     Mental Status: He is alert and oriented to person, place, and time.      Imaging reviewed.   Labs:  COAGS: Recent Labs    12/01/18 1538  INR 1.1    BMP: Recent Labs    09/12/19 0434 09/13/19 0506 09/14/19 0416 09/15/19 0424  NA 137 136 136 136  K 4.0 5.1 3.7 4.0  CL 100 103 100 96*  CO2 27 22 28 27   GLUCOSE 92 83 94 95  BUN 20 28* 10 19  CALCIUM 7.9* 7.6* 7.4* 7.7*  CREATININE 4.50* 5.86* 3.18* 4.61*  GFRNONAA 12* 9* 18* 11*  GFRAA 14* 10* 21* 13*       Electronically Signed: Earley Abide, PA-C  09/15/2019, 4:29 PM   I spent a total of 15 minutes in face to face in clinical consultation, greater than 50% of which was counseling/coordinating care for venous access.

## 2019-09-15 NOTE — Progress Notes (Signed)
Patient is postop day 7 status post left above-knee amputation.  He has not had any drainage into the canister.  He is scheduled to be discharged to a nursing care facility.  Wound VAC was removed.  He does have moderate clotted blood coming from the lateral side of the incision consistent with a hematoma beneath the skin overall the wound edges are opposed.  On the medial side there is an area that has had some dehiscence.  There is no significant active bleeding from this side.  No necrotic areas skin is otherwise in good condition there are no blisters no cellulitis.  New dry dressing was applied with reinforcement.  Discussed with Dr. Sharol Given

## 2019-09-15 NOTE — Progress Notes (Signed)
   09/15/19 0400  Assess: MEWS Score  Temp 97.8 F (36.6 C)  BP (!) 99/52  Pulse Rate 87  ECG Heart Rate 85  Resp 13  SpO2 98 %  O2 Device Room Air  Assess: MEWS Score  MEWS Temp 0  MEWS Systolic 1  MEWS Pulse 0  MEWS RR 1  MEWS LOC 0  MEWS Score 2  MEWS Score Color Yellow  Assess: if the MEWS score is Yellow or Red  Were vital signs taken at a resting state? Yes  Focused Assessment Documented focused assessment  Early Detection of Sepsis Score *See Row Information* Medium  MEWS guidelines implemented *See Row Information* No, vital signs rechecked   Mews 2 noted.  Patient was fast asleep when vital signs were assessed.  Respirations free and easy, no obvious s/s of distress noted.

## 2019-09-15 NOTE — Progress Notes (Signed)
[facility] Progress Note   Dialysis Orders: [facility]- MWF- 4 hours- Thigh AVG- BFR 400/DFR800- 2 K bath as OP- EDW 62. Gets heparin per tx- 2000 units per hour, epo 4000 and sensipar 60 mg. Last hgb there was 4/26 9.9  Assessment/ Plan:   1. GIB- EGD w/duodenal bulb ulcer. Gastric bx without malignancy or H. pylori. PPI per primary team. Hemoglobin relatively stable at this time 2. 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 Wound VAC in place.Graft still has a good bruit this am. 3. Ventricular ectopy- asymptomatic. On amiodarone orally 4. ESRD- On HD MWF.Tolerating dialysis well with no issues. Continue to per his home schedule.Absolutely needs midodrine before HD. Last HD 5/17 Not able to pull much  5/17 (284 net neg); I don't think he has much fluid onboard.  Seen on HD 3K bath through [facility] temp cath. Unable to UF; likely doesn't have much onboard and won't improve till he starts eating better.  Consider appetite stimulant or tube feeds.  Next HD Fri; appreciate VIR for cath conversion; high white count likely inflammation, demargination. S/p amputation and source is no longer present, also no fevers.   5. Dialysis Access: Left lower extremity thigh graft unable to access because of wound VAC. Has temporary catheter because of leukocytosis. Would proceed with tunneled dialysis catheter when able(WBC still markedly elevated). 6. Hypotension/volume-no signs of significant volume overload on midodrine TID.  7. Anemiaof CKD-Hgb stable in 8s s/p 1unit pRBC. Status post iron for low T sat of 11%. Continue ESA. 8. Secondary Hyperparathyroidism -calcium and phosphorus acceptable. Continue sensipar- does not get vit D with HD-phosphorus slightly low(not on binder)Continue to monitor 9. Nutrition- Renal diet w/fluid restrictions  Subjective:   Patient has no appetite. Denies  f/c/n/v/dyspnea   Objective:    BP (!) 104/44   Pulse 89   Temp 98.5 F (36.9 C) (Oral)   Resp 14   Ht 5' 5.98" (1.676 m)   Wt 55.8 kg   SpO2 96%   BMI 19.87 kg/m   Intake/Output Summary (Last 24 hours) at 09/15/2019 1700 Last data filed at 09/15/2019 0443 Gross per 24 hour  Intake 480 ml  Output --  Net 480 ml   Weight change:   Physical Exam: General:chronically ill appearing, frail male in NAD Heart:RRR, +1/7 systolic murmur Lungs:CTA anteriolaterally, bilateral chest rise Abdomen:soft, NTND Extremities:L AKA with wound vac Dialysis Access: L thigh AVG +b but as above dressing over AVG, LIJtemp cath  Imaging: No results found.  Labs: BMET Recent Labs  Lab 09/09/19 0500 09/10/19 0500 09/11/19 0752 09/12/19 0434 09/13/19 0506 09/14/19 0416 09/15/19 0424  NA 138 137 136 137 136 136 136  K 3.9 4.3 3.7 4.0 5.1 3.7 4.0  CL 103 100 99 100 103 100 96*  CO2 25 23 28 27 22 28 27   GLUCOSE 102* 91 116* 92 83 94 95  BUN 29* 36* 14 20 28* 10 19  CREATININE 5.42* 6.79* 3.36* 4.50* 5.86* 3.18* 4.61*  CALCIUM 7.7* 7.8* 7.4* 7.9* 7.6* 7.4* 7.7*  PHOS 3.6 3.8 2.4* 3.4 4.0  --  3.5   CBC Recent Labs  Lab 09/11/19 0752 09/11/19 0752 09/12/19 0434 09/13/19 0506 09/14/19 0416 09/15/19 0424  WBC 21.9*   < > 22.5* 24.9* 20.6* 22.9*  NEUTROABS 18.6*  --  17.9* 19.2*  --  17.3*  HGB 8.0*   < > 7.9* 8.8* 7.8* 8.0*  HCT 24.9*   < > 24.9* 27.8* 24.8* 25.4*  MCV 64.5*   < > 64.8* 65.9* 65.6* 66.3*  PLT 302   < > 302 PLATELET CLUMPS NOTED ON SMEAR, UNABLE TO ESTIMATE 309 349   < > = values in this interval not displayed.    Medications:    . amiodarone  200 mg Oral Daily  . vitamin C  500 mg Oral Q24H  . atorvastatin  40 mg Oral q1800  . B-complex with vitamin C  1 tablet Oral Daily  . Chlorhexidine Gluconate Cloth  6 each Topical Q0600  . cholecalciferol  1,000 Units Oral Daily  . cinacalcet  60 mg Oral QHS  . darbepoetin (ARANESP) injection - DIALYSIS  100 mcg Intravenous Q Mon-HD  . docusate  sodium  100 mg Oral BID  . feeding supplement  1 Container Oral TID BM  . feeding supplement (PRO-STAT SUGAR FREE 64)  30 mL Oral BID  . melatonin  3 mg Oral QHS  . midodrine  10 mg Oral TID WC  . pantoprazole sodium  40 mg Oral BID  . sodium chloride flush  10-40 mL Intracatheter Q12H  . sodium chloride flush  3 mL Intravenous Q12H  . sodium chloride flush  3 mL Intravenous Q12H      Otelia Santee, MD 09/15/2019, 9:38 AM

## 2019-09-15 NOTE — Progress Notes (Signed)
PROGRESS NOTE    [name]  VVZ:[id number] DOB: 12-28-1940 DOA: 09/03/2019 PCP: [name], MD    No chief complaint on file.   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 However in the preoperative area, patient noted to have rectal bleeding for which [facility] was consulted and advised that the procedure will need to be postponed.  Patient was subsequently admitted.    Assessment & Plan:   Principal Problem:   GI bleeding Active Problems:   Encounter for central line placement   ESRD on dialysis [facility])   Aortic stenosis   PVD (peripheral vascular disease) ([place])   Essential hypertension   Duodenal ulcer   Palliative care encounter   DNR (do not resuscitate) discussion   Goals of care, counseling/discussion   Dehiscence of amputation stump ([place])   Chronic hypotension   SVT (supraventricular tachycardia) ([facility])   Ventricular ectopy  Upper GI bleed secondary to duodenal ulcer/acute blood loss anemia Patient noted to have some rectal bleeding in the preop area prior to surgery and subsequently admitted.  Patient transfused a unit of packed red  blood cells 09/04/2019.  Plavix currently on hold.  Status post upper endoscopy 09/04/2019 that showed duodenal ulcer which was likely cause of patient's bleeding per GI.  Colonoscopy subsequently was not performed as patient also had a poor prep. - H&H stable at 7.8.  Continue PPI every 12 hours.  Continue to hold Overall hemoglobin remained stable, will continue to monitor closely. -Hold Plavix, this can be resumed once permacath catheter has been placed.  End-stage renal disease on hemodialysis Patient noted per nephrology that dressing from wound VAC currently over patient's AV graft.  Per nephrology thigh graft not accessible for dialysis and assess patient will need a tunneled dialysis catheter.  Patient seen by IR and underwent exchange of left IJ central line for triple-lumen HD catheter by Dr. Laurence Ferrari.  Patient had hemodialysis the evening of 09/10/2019.  Per nephrology patient will need permanent access prior to discharge.  Per nephrology.   -Left lower extremity thigh graft unable to be accessed secondary to wound VAC, currently has temporary leukocytosis, which placement of tunneled dialysis catheter has been placed.  Progressive gangrenous necrotic changes with stump dehiscence of left transtibial amputation site - Patient seen by orthopedics.  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  Patient initially with wound VAC, has been seen today by orthopedic where it has been discontinued . currently on Prevena boot.  Outpatient follow-up with orthopedics.  Per orthopedics.  Leukocytosis Likely secondary to problem #3.  Leukocytosis was fluctuating but slowly started to trend down. Status post left AKA.  Patient started on IV Zyvox which is subsequently been discontinued..  Repeat blood cultures pending with no growth to date.  Due to worsening leukocytosis ID consulted who recommended discontinuation of antibiotics, MRI of the right foot, and consideration for  CT  chest abdomen and pelvis if worsening WBC and no cause found.  ID have signed off.  Follow.   Chronic diastolic heart failure Compensated.  On hemodialysis.  Per nephrology.  Chronic hypotension Midodrine.    SVT Patient noted to have a run of SVT with heart rates as high as the 200s in the PACU postoperatively on 09/08/2019.  Patient received a dose of IV metoprolol and SVT broken and heart rate in the 70s.  Patient started on amiodarone and dose increased to 200 mg daily per cardiology for ventricular ectopy.  Outpatient follow-up with cardiology.    Ventricular ectopy Patient started on amiodarone per cardiology.  Amiodarone dose increased to 200 mg daily per cardiology.  Outpatient follow-up with cardiology.  Follow.  Severe Aortic stenosis Noted on TEE during previous admission.  Patient noted to not be a candidate for TAVR.  Avoid hypotension.  Cardiology was following but have signed off.    Prolonged QTC Repeat EKG with QTC of 509.  Keep magnesium > 2.  Keep potassium greater > 4.  Iron deficiency anemia/anemia of chronic disease -Continue to monitor closely and transfuse as needed   History of CVA Plavix currently on hold.  Could potentially likely resume Plavix after permanent catheter has been placed before discharge.    Generalized deconditioning Patient overall with a poor prognosis.  Palliative care has assessed and followed patient.  Patient now with partial code.  Patient wishes to have CPR and ACLS medication but no intubation or shock.  PT/OT.  Needs SNF.     DVT prophylaxis: Recent GI bleed.  Avoiding chemical prophylaxis.  SCDs. Code Status: Partial Family Communication: Updated patient, no family at bedside.  Disposition:   Status is: Inpatient    Dispo: The patient is from: SNF              Anticipated d/c is to: SNF              Anticipated d/c date is: >3days              Patient with a leukocytosis post amputation that is slowly trending down but  still elevated.  Seen by ID and antibiotics discontinued.  Needs permanent dialysis catheter which is pending to be placed.        Consultants:   Gastroenterology: Dr. Tarri Glenn 09/03/2019  Nephrology: Dr.Schertz 09/03/2019  Palliative care: Lindell Spar, NP 09/04/2019  Cardiology: Dr. Lovena Le 09/04/2019  Domenic Moras, FNP 09/07/2019  Orthopedics: Dr. Sharol Given  ID: Dr. Tommy Medal 09/10/2019  Procedures:   Chest x-ray 09/03/2019  Upper endoscopy 09/04/2019--per Dr. Tarri Glenn  Left above-the-knee amputation per Dr. Sharol Given 09/08/2019 EGD on 09/04/2019 Impression: - Duodenal ulcer may be the cause of recent overt  GI bleeding. 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 Biopsied. - Erosive gastropathy with no bleeding and no  stigmata of recent bleeding. Biopsied. - A single non-bleeding angioectasia in the  duodenum. - Small hiatal hernia. - Unable to proceed with colonoscopy as planned due  to incomplete preparation. Patient had formed stool  on last bowel movement. Recommendation: - Return patient to hospital ward for ongoing care. - Clear liquid diet. Advance as tolerated. - No aspirin, ibuprofen, naproxen, or other  non-steroidal anti-inflammatory drugs. - Patient has a prolonged QT. Discussed with Dr.  Starla Link who agreed with trial of pantoprazole 40 mg  IV BID with close monitoring of QTc. - Will defer colonoscopy at this time given the  challenges that the patient has prepping for the  procedure. Duodenal ulcer may be the source of  bleeding. - Continue serial hgb/hct with transfusion as  indicated.  Exchange of left IJ central line for triple-lumen HD catheter--per IR Dr. Laurence Ferrari 09/10/2019  MRI right foot 09/10/2019  Antimicrobials:   IV Ancef 09/08/2019 preoperatively  IV Zyvox 09/08/2019>>>>> 09/10/2019   Subjective: Patient sleeping but arousable.  Denies chest pain or shortness of breath.  No lower extremity pain.  No nausea or vomiting.  Objective: Vitals:   09/15/19 1057 09/15/19 1207 09/15/19 1304 09/15/19 1400  BP: (!) 120/45 (!) 124/46 (!) 94/39   Pulse: 84 92 (!) 105 89  Resp: 18 20 18 17   Temp: 98.5 F (36.9 C) 98.9 F (37.2 C) 99.4 F (37.4 C)   TempSrc: Oral Oral Oral   SpO2: 100% 100% 99% 100%  Weight:      Height:        Intake/Output Summary (Last 24 hours) at 09/15/2019 1534 Last data filed at 09/15/2019 1200 Gross per 24 hour  Intake 290 ml  Output 212 ml  Net 78 ml   Filed Weights   09/13/19 0715 09/14/19 0410 09/15/19 0717  Weight: 59.9 kg 60.2 kg 55.8 kg    Examination:  Awake Alert, Oriented X 3, No new F.N deficits, Normal affect Symmetrical Chest wall movement, Good air movement bilaterally, CTAB RRR,No Gallops,Rubs or new Murmurs, No Parasternal Heave +ve B.Sounds, Abd Soft, No tenderness, No rebound - guarding or rigidity. Extremities: Status post left BKA ,  Status post right transmetatarsal amputation.     Data Reviewed: I have personally reviewed following labs and imaging studies  CBC: Recent Labs  Lab 09/10/19 0500 09/10/19 0500 09/11/19 0752 09/12/19 0434 09/13/19 0506 09/14/19 0416 09/15/19 0424  WBC 27.5*   < > 21.9* 22.5* 24.9* 20.6* 22.9*  NEUTROABS 21.6*  --  18.6* 17.9* 19.2*  --  17.3*  HGB 8.1*   < > 8.0* 7.9* 8.8* 7.8* 8.0*  HCT 25.1*   < > 24.9* 24.9* 27.8* 24.8* 25.4*  MCV 65.5*    < > 64.5* 64.8* 65.9* 65.6* 66.3*  PLT 306   < > 302 302 PLATELET CLUMPS NOTED ON SMEAR, UNABLE TO ESTIMATE 309 349   < > = values in this interval not displayed.    Basic Metabolic Panel: Recent Labs  Lab 09/09/19 0500 09/09/19 0500 09/10/19 0500 09/10/19 0500 09/11/19 0752 09/12/19 0434 09/13/19 0506 09/14/19 0416 09/15/19 0424  NA 138   < > 137   < > 136 137 136 136 136  K 3.9   < > 4.3   < > 3.7 4.0 5.1 3.7 4.0  CL 103   < > 100   < > 99 100 103 100 96*  CO2 25   < > 23   < > 28 27 22 28 27   GLUCOSE 102*   < > 91   < > 116* 92 83 94 95  BUN 29*   < > 36*   < > 14 20 28* 10 19  CREATININE 5.42*   < > 6.79*   < > 3.36* 4.50* 5.86* 3.18* 4.61*  CALCIUM 7.7*   < > 7.8*   < > 7.4* 7.9* 7.6* 7.4* 7.7*  MG 1.8  --  2.4  --   --   --   --   --   --   PHOS 3.6   < > 3.8  --  2.4* 3.4 4.0  --  3.5   < > =  values in this interval not displayed.    GFR: Estimated Creatinine Clearance: 10.8 mL/min (A) (by C-G formula based on SCr of 4.61 mg/dL (H)).  Liver Function Tests: Recent Labs  Lab 09/10/19 0500 09/11/19 0752 09/12/19 0434 09/13/19 0506 09/15/19 0424  ALBUMIN 1.5* 1.5* 1.5* 1.5* 1.7*    CBG: Recent Labs  Lab 09/15/19 0625 09/15/19 0641  GLUCAP 96 120*     Recent Results (from the past 240 hour(s))  MRSA PCR Screening     Status: None   Collection Time: 09/06/19  4:55 AM   Specimen: Nasopharyngeal  Result Value Ref Range Status   MRSA by PCR NEGATIVE NEGATIVE Final    Comment:        The GeneXpert MRSA Assay (FDA approved for NASAL specimens only), is one component of a comprehensive MRSA colonization surveillance program. It is not intended to diagnose MRSA infection nor to guide or monitor treatment for MRSA infections. Performed at [facility], [place] [address], [place], [place] [address]   Surgical pcr screen     Status: None   Collection Time: 09/07/19  7:45 PM   Specimen: Nasal Mucosa; Nasal Swab  Result Value Ref Range Status   MRSA,  PCR NEGATIVE NEGATIVE Final   Staphylococcus aureus NEGATIVE NEGATIVE Final    Comment: (NOTE) The Xpert SA Assay (FDA approved for NASAL specimens in patients 52 years of age and older), is one component of a comprehensive surveillance program. It is not intended to diagnose infection nor to guide or monitor treatment. Performed at [facility], [place] [address] [place], [place] [address]   Culture, blood (Routine X 2) w Reflex to ID Panel     Status: None   Collection Time: 09/10/19  9:20 AM   Specimen: BLOOD RIGHT HAND  Result Value Ref Range Status   Specimen Description BLOOD RIGHT HAND  Final   Special Requests   Final    BOTTLES DRAWN AEROBIC ONLY Blood Culture results may not be optimal due to an inadequate volume of blood received in culture bottles   Culture   Final    NO GROWTH 5 DAYS Performed at [facility], [place] [address] [place], [place] [address]    Report Status 09/15/2019 FINAL  Final  Culture, blood (Routine X 2) w Reflex to ID Panel     Status: None   Collection Time: 09/10/19 10:15 AM   Specimen: BLOOD RIGHT ARM  Result Value Ref Range Status   Specimen Description BLOOD RIGHT ARM  Final   Special Requests   Final    BOTTLES DRAWN AEROBIC ONLY Blood Culture adequate volume   Culture   Final    NO GROWTH 5 DAYS Performed at [facility], [place] [address], [place], [place] [address]    Report Status 09/15/2019 FINAL  Final         Radiology Studies: No results found.      Scheduled Meds: . amiodarone  200 mg Oral Daily  . vitamin C  500 mg Oral Q24H  . atorvastatin  40 mg Oral q1800  . B-complex with vitamin C  1 tablet Oral Daily  . Chlorhexidine Gluconate Cloth  6 each Topical Q0600  . cholecalciferol  1,000 Units Oral Daily  . cinacalcet  60 mg Oral QHS  . darbepoetin (ARANESP) injection - DIALYSIS  100 mcg Intravenous Q Mon-HD  . docusate sodium  100 mg Oral BID  . feeding supplement  1 Container  Oral TID BM  . feeding  supplement (PRO-STAT SUGAR FREE 64)  30 mL Oral BID  . melatonin  3 mg Oral QHS  . midodrine  10 mg Oral TID WC  . pantoprazole sodium  40 mg Oral BID  . sodium chloride flush  10-40 mL Intracatheter Q12H  . sodium chloride flush  3 mL Intravenous Q12H  . sodium chloride flush  3 mL Intravenous Q12H   Continuous Infusions: . sodium chloride Stopped (09/04/19 1236)  . ferric gluconate (FERRLECIT/NULECIT) IV Stopped (09/15/19 1143)     LOS: 12 days    Time spent: 61 minutes    Phillips Climes, MD [facility]   09/15/2019, 3:34 PM

## 2019-09-15 NOTE — Plan of Care (Signed)
  Problem: Education: Goal: Knowledge of General Education information will improve Description: Including pain rating scale, medication(s)/side effects and non-pharmacologic comfort measures Outcome: Progressing   Problem: Health Behavior/Discharge Planning: Goal: Ability to manage health-related needs will improve Outcome: Progressing   Problem: Clinical Measurements: Goal: Ability to maintain clinical measurements within normal limits will improve Outcome: Progressing Goal: Will remain free from infection Outcome: Progressing Goal: Diagnostic test results will improve Outcome: Not Progressing Note: Abnormal laboratory results noted; patient is a dialysis patient  Goal: Respiratory complications will improve Outcome: Progressing Goal: Cardiovascular complication will be avoided Outcome: Progressing   Problem: Activity: Goal: Risk for activity intolerance will decrease Outcome: Not Progressing Note: No progress being made towards goals; patient still has poor tolerance for physical activity.  Continue with current plan of care.   Problem: Nutrition: Goal: Adequate nutrition will be maintained Outcome: Not Progressing Note: No progress being made towards goals; patient has a poor appettite.  Patient refused additional snacks on shift, but didi agree to drink ProStat.   Problem: Coping: Goal: Level of anxiety will decrease Outcome: Progressing   Problem: Elimination: Goal: Will not experience complications related to bowel motility Outcome: Progressing Goal: Will not experience complications related to urinary retention Outcome: Not Progressing Note: Anuric at baseline; patient is ERSD and on dialsysis   Problem: Safety: Goal: Ability to remain free from injury will improve Outcome: Progressing   Problem: Skin Integrity: Goal: Risk for impaired skin integrity will decrease Outcome: Progressing

## 2019-09-15 NOTE — Progress Notes (Signed)
Patient has  been stuck by lab 4 times and they are unable to get labs, per labs protocol they have to wait 24 hour for the next stick, also he is refusing all his protein supplements.

## 2019-09-15 NOTE — Progress Notes (Signed)
   09/15/19 0601  Assess: MEWS Score  Temp 97.6 F (36.4 C)  BP (!) 111/53  ECG Heart Rate 91  Resp 19  Level of Consciousness Alert  Assess: MEWS Score  MEWS Temp 0  MEWS Systolic 0  MEWS Pulse 0  MEWS RR 0  MEWS LOC 0  MEWS Score 0  MEWS Score Color Green   Mews 2 noted; suspected to be triggered by incomplete entry of vital signs by telemetry monitoring unit.  Vital signs re-asssessed.  MEWS has returned back to green.

## 2019-09-16 ENCOUNTER — Inpatient Hospital Stay (HOSPITAL_COMMUNITY): Payer: Medicare Other

## 2019-09-16 HISTORY — PX: IR FLUORO GUIDE CV LINE RIGHT: IMG2283

## 2019-09-16 HISTORY — PX: IR US GUIDE VASC ACCESS RIGHT: IMG2390

## 2019-09-16 MED ORDER — CEFAZOLIN SODIUM-DEXTROSE 2-4 GM/100ML-% IV SOLN
INTRAVENOUS | Status: AC
  Administered 2019-09-16: 2 g
  Filled 2019-09-16: qty 100

## 2019-09-16 MED ORDER — FENTANYL CITRATE (PF) 100 MCG/2ML IJ SOLN
INTRAMUSCULAR | Status: AC
  Filled 2019-09-16: qty 2

## 2019-09-16 MED ORDER — LIDOCAINE HCL 1 % IJ SOLN
INTRAMUSCULAR | Status: AC | PRN
  Administered 2019-09-16: 10 mL

## 2019-09-16 MED ORDER — CLOPIDOGREL BISULFATE 75 MG PO TABS
75.0000 mg | ORAL_TABLET | Freq: Every day | ORAL | Status: DC
  Administered 2019-09-17 – 2019-09-18 (×2): 75 mg via ORAL
  Filled 2019-09-16 (×2): qty 1

## 2019-09-16 MED ORDER — GELATIN ABSORBABLE 12-7 MM EX MISC
CUTANEOUS | Status: AC
  Filled 2019-09-16: qty 1

## 2019-09-16 MED ORDER — FENTANYL CITRATE (PF) 100 MCG/2ML IJ SOLN
INTRAMUSCULAR | Status: AC | PRN
  Administered 2019-09-16: 50 ug via INTRAVENOUS

## 2019-09-16 MED ORDER — LIDOCAINE HCL 1 % IJ SOLN
INTRAMUSCULAR | Status: AC
  Filled 2019-09-16: qty 20

## 2019-09-16 MED ORDER — HEPARIN SODIUM (PORCINE) 1000 UNIT/ML IJ SOLN
INTRAMUSCULAR | Status: AC
  Filled 2019-09-16: qty 1

## 2019-09-16 MED ORDER — CHLORHEXIDINE GLUCONATE 4 % EX LIQD
CUTANEOUS | Status: AC
  Filled 2019-09-16: qty 15

## 2019-09-16 NOTE — Progress Notes (Addendum)
[facility] Progress Note   Dialysis Orders: [facility]- MWF- 4 hours- Thigh AVG- BFR 400/DFR800- 2 K bath as OP- EDW 62. Gets heparin per tx- 2000 units per hour, epo 4000 and sensipar 60 mg. Last hgb there was 4/26 9.9  Assessment/ Plan:   1. GIB- EGD w/duodenal bulb ulcer. Gastric bx without malignancy or H. pylori. PPI per primary team. Hemoglobin relatively stable at this time 2. 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 Wound VAC in place.Graft does not have  a bruit this am for the 1st time. 3. Ventricular ectopy- asymptomatic. On amiodarone orally 4. ESRD- On HD MWF.Tolerating dialysis well with no issues. Continue to per his home schedule.Absolutely needs midodrine before HD. HD 5/17 (284 UF), 5/19 (net UF 212)  Next HD Fri.  Consider appetite stimulant or tube feeds.  Appreciate VIR for cath conversion; high white count likely inflammation, demargination. S/p amputation and source is no longer present, also no fevers.   5. Dialysis Access: Left lower extremity thigh graft unable to access because of wound VAC but for 1st time today (5/20) no bruit. Has temporary catheter because of leukocytosis. Would proceed with tunneled dialysis catheter when able(WBC still markedly elevated).   - D/w VVS (Dr. Carlis Abbott) but likely should wait a few weeks before attempting a declot given recent surgery on that limb; would like to let the wound heal fully before attempting a declot.  6. Hypotension/volume-no signs of significant volume overload on midodrine TID.  7. Anemiaof CKD-Hgb stable in 8s s/p 1unit pRBC. Status post iron for low T sat of 11%. Continue ESA. 8. Secondary Hyperparathyroidism -calcium and phosphorus acceptable. Continue sensipar- does not get vit D with HD-phosphorus slightly low(not on binder)Continue to monitor 9. Nutrition- Renal diet w/fluid restrictions  Subjective:   Patient has poor   appetite. Denies  f/c/n/v/dyspnea  Asking when he can go home.  Objective:   BP (!) 95/57   Pulse 89   Temp 98.7 F (37.1 C) (Oral)   Resp 19   Ht 5' 5.98" (1.676 m)   Wt 54.4 kg   SpO2 91%   BMI 19.38 kg/m   Intake/Output Summary (Last 24 hours) at 09/16/2019 0731 Last data filed at 09/15/2019 1200 Gross per 24 hour  Intake 50 ml  Output 212 ml  Net -162 ml   Weight change:   Physical Exam: General:chronically ill appearing, frail male in NAD Heart:RRR, +2/2 systolic murmur Lungs:CTA anteriolaterally, bilateral chest rise Abdomen:soft, NTND Extremities:L AKA with wound vac Dialysis Access: L thigh AVG NO bruit today, wound vac out, LIJtemp cath  Imaging: No results found.  Labs: BMET Recent Labs  Lab 09/10/19 0500 09/11/19 0752 09/12/19 0434 09/13/19 0506 09/14/19 0416 09/15/19 0424  NA 137 136 137 136 136 136  K 4.3 3.7 4.0 5.1 3.7 4.0  CL 100 99 100 103 100 96*  CO2 23 28 27 22 28 27   GLUCOSE 91 116* 92 83 94 95  BUN 36* 14 20 28* 10 19  CREATININE 6.79* 3.36* 4.50* 5.86* 3.18* 4.61*  CALCIUM 7.8* 7.4* 7.9* 7.6* 7.4* 7.7*  PHOS 3.8 2.4* 3.4 4.0  --  3.5   CBC Recent Labs  Lab 09/11/19 0752 09/11/19 0752 09/12/19 0434 09/13/19 0506 09/14/19 0416 09/15/19 0424  WBC 21.9*   < > 22.5* 24.9* 20.6* 22.9*  NEUTROABS 18.6*  --  17.9* 19.2*  --  17.3*  HGB 8.0*   < > 7.9* 8.8* 7.8* 8.0*  HCT 24.9*   < >  24.9* 27.8* 24.8* 25.4*  MCV 64.5*   < > 64.8* 65.9* 65.6* 66.3*  PLT 302   < > 302 PLATELET CLUMPS NOTED ON SMEAR, UNABLE TO ESTIMATE 309 349   < > = values in this interval not displayed.    Medications:    . amiodarone  200 mg Oral Daily  . vitamin C  500 mg Oral Q24H  . atorvastatin  40 mg Oral q1800  . B-complex with vitamin C  1 tablet Oral Daily  . Chlorhexidine Gluconate Cloth  6 each Topical Q0600  . cholecalciferol  1,000 Units Oral Daily  . cinacalcet  60 mg Oral QHS  . darbepoetin (ARANESP) injection - DIALYSIS  100 mcg  Intravenous Q Mon-HD  . docusate sodium  100 mg Oral BID  . feeding supplement  1 Container Oral TID BM  . feeding supplement (PRO-STAT SUGAR FREE 64)  30 mL Oral BID  . melatonin  3 mg Oral QHS  . midodrine  10 mg Oral TID WC  . pantoprazole sodium  40 mg Oral BID  . sodium chloride flush  10-40 mL Intracatheter Q12H  . sodium chloride flush  3 mL Intravenous Q12H  . sodium chloride flush  3 mL Intravenous Q12H      Otelia Santee, MD 09/16/2019, 7:31 AM

## 2019-09-16 NOTE — TOC Progression Note (Signed)
Transition of Care (TOC) - Progression Note    Patient Details  Name: NOVAH GOZA Sr. MRN: [id number] Date of Birth: 15-Nov-1940  Transition of Care [facility]) CM/SW [place], LCSW Phone Number: 09/16/2019, 8:56 AM  Clinical Narrative:    CSW updated patient's daughter and [facility] that patient is not medically stable to discharge yet.    Expected Discharge Plan: Skilled Nursing Facility Barriers to Discharge: Continued Medical Work up  Expected Discharge Plan and Services Expected Discharge Plan: [place] In-house Referral: Clinical Social Work   Post Acute Care Choice: [place] Living arrangements for the past 2 months: [place]                                       Social Determinants of Health (SDOH) Interventions    Readmission Risk Interventions Readmission Risk Prevention Plan 09/10/2019 09/08/2019 09/08/2019  Transportation Screening Complete - Complete  PCP or Specialist Appt within 3-5 Days - - Complete  HRI or [place] - - Complete  Social Work Consult for [place] Planning/Counseling - - Complete  Palliative Care Screening - Complete Not Applicable  Medication Review [employer] photographer) - - Complete  Some recent data might be hidden

## 2019-09-16 NOTE — Progress Notes (Signed)
PROGRESS NOTE    Detric Scalisi  KGM:[id number] DOB: 1941-02-02 DOA: 09/03/2019 PCP: [name], MD    No chief complaint on file.   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 However in the preoperative area, patient noted to have rectal bleeding for which [facility] was consulted and advised that the procedure will need to be postponed.  Patient was subsequently admitted.  Subjective: Patient sleeping but arousable.  Denies chest pain or shortness of breath.  No lower extremity pain.  No nausea or vomiting.   Assessment & Plan:   Principal Problem:   GI bleeding Active Problems:   Encounter for central line placement   ESRD on dialysis [facility])   Aortic stenosis   PVD (peripheral vascular disease) ([place])   Essential hypertension   Duodenal ulcer   Palliative care encounter   DNR (do not resuscitate) discussion   Goals of care, counseling/discussion   Dehiscence of amputation stump ([place])   Chronic hypotension   SVT (supraventricular tachycardia) ([facility])   Ventricular ectopy  Upper GI bleed secondary to duodenal ulcer/acute blood loss anemia Patient  noted to have some rectal bleeding in the preop area prior to surgery and subsequently admitted.  Patient transfused a unit of packed red blood cells 09/04/2019.  Plavix currently on hold.  Status post upper endoscopy 09/04/2019 that showed duodenal ulcer which was likely cause of patient's bleeding per GI.  Colonoscopy subsequently was not performed as patient also had a poor prep. -Continue to monitor hemoglobin and transfuse as needed, so far hemoglobin remained stable H&H stable at 7.8.  Continue PPI every 12 hours.  Continue to hold Overall hemoglobin remained stable, will continue to monitor closely. -Now he has his permanent catheter placed, will resume back on Plavix, it is okay to be resumed as discussed with GI.  End-stage renal disease on hemodialysis - Patient noted per nephrology that dressing from wound VAC currently over patient's AV graft. (And currently wound dressing). -IR has been consulted for tunnel dialysis dialysis catheter placement(which has postponed initially given markedly elevated white blood cell, but patient appears nontoxic, with chronic leukocytosis, with no evidence of acute infection, he had his tunneled catheter placed 5/20. -Hemodialysis management per renal.  Progressive gangrenous necrotic changes with stump dehiscence of left transtibial amputation site - Patient seen by orthopedics.  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  Patient initially with wound VAC, has been seen today by orthopedic where it has been discontinued . currently on Prevena boot.  Outpatient follow-up with orthopedics.  Per orthopedics.  Leukocytosis Likely secondary to problem #3.  Leukocytosis was fluctuating but slowly started to trend down. Status post left AKA.  Patient started on IV Zyvox which is subsequently been discontinued..  Repeat blood cultures with no growth to date.  Due to worsening leukocytosis ID consulted who recommended discontinuation  of antibiotics, MRI of the right foot,  and consideration for CT chest abdomen and pelvis if worsening WBC and no cause found.  ID have signed off.  Follow.   Chronic diastolic heart failure Compensated.  On hemodialysis.  Per nephrology.  Chronic hypotension Midodrine.    SVT Patient noted to have a run of SVT with heart rates as high as the 200s in the PACU postoperatively on 09/08/2019.  Patient received a dose of IV metoprolol and SVT broken and heart rate in the 70s.  Patient started on amiodarone and dose increased to 200 mg daily per cardiology for ventricular ectopy.  Outpatient follow-up with cardiology.    Ventricular ectopy Patient started on amiodarone per cardiology.  Amiodarone dose increased to 200 mg daily per cardiology.  Outpatient follow-up with cardiology.  Follow.  Severe Aortic stenosis Noted on TEE during previous admission.  Patient noted to not be a candidate for TAVR.  Avoid hypotension.  Cardiology was following but have signed off.    Prolonged QTC Repeat EKG with QTC of 509.  Keep magnesium > 2.  Keep potassium greater > 4.  Iron deficiency anemia/anemia of chronic disease -Continue to monitor closely and transfuse as needed   History of CVA Plavix currently on hold.  Could potentially likely resume Plavix after permanent catheter has been placed before discharge.    Generalized deconditioning Patient overall with a poor prognosis.  Palliative care has assessed and followed patient.  Patient now with partial code.  Patient wishes to have CPR and ACLS medication but no intubation or shock.  PT/OT.  Needs SNF.     DVT prophylaxis: Recent GI bleed.  Avoiding chemical prophylaxis.  SCDs. Code Status: Partial Family Communication: Updated patient, no family at bedside.  Disposition:   Status is: Inpatient    Dispo: The patient is from: SNF              Anticipated d/c is to: SNF              Anticipated d/c date is: 2 days              Patient with a  leukocytosis post amputation that is slowly trending down but still elevated.  Seen by ID and antibiotics discontinued.  Needs permanent dialysis catheter which is pending to be placed.        Consultants:   Gastroenterology: Dr. Tarri Glenn 09/03/2019  Nephrology: Dr.Schertz 09/03/2019  Palliative care: Lindell Spar, NP 09/04/2019  Cardiology: Dr. Lovena Le 09/04/2019  Domenic Moras, FNP 09/07/2019  Orthopedics: Dr. Sharol Given  ID: Dr. Tommy Medal 09/10/2019  Procedures:   Chest x-ray 09/03/2019  Upper endoscopy 09/04/2019--per Dr. Tarri Glenn  Left above-the-knee amputation per Dr. Sharol Given 09/08/2019 EGD on 09/04/2019 Impression: - Duodenal ulcer may be the cause of recent overt  GI bleeding. 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 Biopsied. - Erosive gastropathy with no bleeding and no  stigmata of recent bleeding. Biopsied. - A single non-bleeding angioectasia in the  duodenum. - Small hiatal hernia. - Unable to proceed with colonoscopy as planned due  to incomplete preparation. Patient had formed stool  on last bowel movement. Recommendation: - Return patient to hospital ward for ongoing care. - Clear liquid diet. Advance as tolerated. - No aspirin, ibuprofen, naproxen, or other  non-steroidal anti-inflammatory drugs. - Patient has a prolonged QT. Discussed with Dr.  Starla Link who agreed with trial of pantoprazole 40 mg  IV BID with close monitoring of QTc. - Will defer colonoscopy at this time given the  challenges that the  patient has  prepping for the  procedure. Duodenal ulcer may be the source of  bleeding. - Continue serial hgb/hct with transfusion as  indicated.  Exchange of left IJ central line for triple-lumen HD catheter--per IR Dr. Laurence Ferrari 09/10/2019  MRI right foot 09/10/2019  Antimicrobials:   IV Ancef 09/08/2019 preoperatively  IV Zyvox 09/08/2019>>>>> 09/10/2019    Objective: Vitals:   09/16/19 1135 09/16/19 1140 09/16/19 1145 09/16/19 1204  BP: 117/65 (!) 103/53 (!) 109/58 (!) 101/44  Pulse: 85 80 81 80  Resp: 16 18 14    Temp:      TempSrc:      SpO2:      Weight:      Height:        Intake/Output Summary (Last 24 hours) at 09/16/2019 1417 Last data filed at 09/16/2019 0900 Gross per 24 hour  Intake 240 ml  Output --  Net 240 ml   Filed Weights   09/14/19 0410 09/15/19 0717 09/16/19 0519  Weight: 60.2 kg 55.8 kg 54.4 kg    Examination:  Awake Alert, Oriented X 3, No new F.N deficits, Normal affect Symmetrical Chest wall movement, Good air movement bilaterally, CTAB RRR,No Gallops,Rubs or new Murmurs, No Parasternal Heave +ve B.Sounds, Abd Soft, No tenderness, No rebound - guarding or rigidity. Extremities: Status post left BKA ,  Status post right transmetatarsal amputation.     Data Reviewed: I have personally reviewed following labs and imaging studies  CBC: Recent Labs  Lab 09/10/19 0500 09/10/19 0500 09/11/19 0752 09/12/19 0434 09/13/19 0506 09/14/19 0416 09/15/19 0424  WBC 27.5*   < > 21.9* 22.5* 24.9* 20.6* 22.9*  NEUTROABS 21.6*  --  18.6* 17.9* 19.2*  --  17.3*  HGB 8.1*   < > 8.0* 7.9* 8.8* 7.8* 8.0*  HCT 25.1*   < > 24.9* 24.9* 27.8* 24.8* 25.4*  MCV 65.5*   < > 64.5* 64.8* 65.9* 65.6* 66.3*  PLT 306   < > 302 302 PLATELET CLUMPS NOTED ON SMEAR, UNABLE TO ESTIMATE 309 349   < > = values in this interval not displayed.    Basic Metabolic  Panel: Recent Labs  Lab 09/10/19 0500 09/10/19 0500 09/11/19 0752 09/12/19 0434 09/13/19 0506 09/14/19 0416 09/15/19 0424  NA 137   < > 136 137 136 136 136  K 4.3   < > 3.7 4.0 5.1 3.7 4.0  CL 100   < > 99 100 103 100 96*  CO2 23   < > 28 27 22 28 27   GLUCOSE 91   < > 116* 92 83 94 95  BUN 36*   < > 14 20 28* 10 19  CREATININE 6.79*   < > 3.36* 4.50* 5.86* 3.18* 4.61*  CALCIUM 7.8*   < > 7.4* 7.9* 7.6* 7.4* 7.7*  MG 2.4  --   --   --   --   --   --   PHOS 3.8  --  2.4* 3.4 4.0  --  3.5   < > = values in this interval not displayed.    GFR: Estimated Creatinine Clearance: 10.5 mL/min (A) (by C-G formula based on SCr of 4.61 mg/dL (H)).  Liver Function Tests: Recent Labs  Lab 09/10/19 0500 09/11/19 0752 09/12/19 0434 09/13/19 0506 09/15/19 0424  ALBUMIN 1.5* 1.5* 1.5* 1.5* 1.7*    CBG: Recent Labs  Lab 09/15/19 0625 09/15/19 0641  GLUCAP 96 120*     Recent Results (from the past 240 hour(s))  Surgical pcr screen  Status: None   Collection Time: 09/07/19  7:45 PM   Specimen: Nasal Mucosa; Nasal Swab  Result Value Ref Range Status   MRSA, PCR NEGATIVE NEGATIVE Final   Staphylococcus aureus NEGATIVE NEGATIVE Final    Comment: (NOTE) The Xpert SA Assay (FDA approved for NASAL specimens in patients 82 years of age and older), is one component of a comprehensive surveillance program. It is not intended to diagnose infection nor to guide or monitor treatment. Performed at [facility], [place] [address] [place], [place] [address]   Culture, blood (Routine X 2) w Reflex to ID Panel     Status: None   Collection Time: 09/10/19  9:20 AM   Specimen: BLOOD RIGHT HAND  Result Value Ref Range Status   Specimen Description BLOOD RIGHT HAND  Final   Special Requests   Final    BOTTLES DRAWN AEROBIC ONLY Blood Culture results may not be optimal due to an inadequate volume of blood received in culture bottles   Culture   Final    NO GROWTH 5 DAYS Performed at  [facility], [place] [address], [place], [place] [address]    Report Status 09/15/2019 FINAL  Final  Culture, blood (Routine X 2) w Reflex to ID Panel     Status: None   Collection Time: 09/10/19 10:15 AM   Specimen: BLOOD RIGHT ARM  Result Value Ref Range Status   Specimen Description BLOOD RIGHT ARM  Final   Special Requests   Final    BOTTLES DRAWN AEROBIC ONLY Blood Culture adequate volume   Culture   Final    NO GROWTH 5 DAYS Performed at [facility], [place] [address], [place], [place] [address]    Report Status 09/15/2019 FINAL  Final         Radiology Studies: IR Fluoro Guide CV Line Right  Result Date: 09/16/2019 INDICATION: 79 year old male with a history of renal failure referred for hemodialysis catheter placement EXAM: TUNNELED CENTRAL VENOUS HEMODIALYSIS CATHETER PLACEMENT WITH ULTRASOUND AND FLUOROSCOPIC GUIDANCE MEDICATIONS: 2 g Ancef. The antibiotic was given in an appropriate time interval prior to skin puncture. ANESTHESIA/SEDATION: Moderate (conscious) sedation was employed during this procedure. A total of Versed 0 mg and Fentanyl 50 mcg was administered intravenously. Moderate Sedation Time: 0 minutes. The patient's level of consciousness and vital signs were monitored continuously by radiology nursing throughout the procedure under my direct supervision. FLUOROSCOPY TIME:  Fluoroscopy Time: 0 minutes 18 seconds (3 mGy). COMPLICATIONS: None PROCEDURE: Informed written consent was obtained from the patient after a discussion of the risks, benefits, and alternatives to treatment. Questions regarding the procedure were encouraged and answered. The right neck and chest were prepped with chlorhexidine in a sterile fashion, and a sterile drape was applied covering the operative field. Maximum barrier sterile technique with sterile gowns and gloves were used for the procedure. A timeout was performed prior to the initiation of the procedure. Ultrasound survey was  performed. Right external jugular vein was patent. Right internal jugular vein is occluded at the inlet. Micropuncture kit was utilized to access the right external jugular vein under direct, real-time ultrasound guidance after the overlying soft tissues were anesthetized with 1% lidocaine with epinephrine. Stab incision was made with 11 blade scalpel. Microwire was passed centrally. The microwire was then marked to measure appropriate internal catheter length. External tunneled length was estimated. A total tip to cuff length of 23 cm was selected. 035 guidewire was advanced to the level  of the IVC. Skin and subcutaneous tissues of chest wall below the clavicle were generously infiltrated with 1% lidocaine for local anesthesia. A small stab incision was made with 11 blade scalpel. The selected hemodialysis catheter was tunneled in a retrograde fashion from the anterior chest wall to the venotomy incision. Serial dilation was performed and then a peel-away sheath was placed. The catheter was then placed through the peel-away sheath with tips ultimately positioned within the superior aspect of the right atrium. Final catheter positioning was confirmed and documented with a spot radiographic image. The catheter aspirates and flushes normally. The catheter was flushed with appropriate volume heparin dwells. The catheter exit site was secured with a 0-Prolene retention suture. Gel-Foam slurry was infused into the soft tissue tract. The venotomy incision was closed Derma bond and sterile dressing. Dressings were applied at the chest wall. Patient tolerated the procedure well and remained hemodynamically stable throughout. No complications were encountered and no significant blood loss encountered. IMPRESSION: Status post image guided placement of right external jugular tunneled hemodialysis catheter. Catheter ready for use. Signed, Dulcy Fanny. Dellia Nims, RPVI [facility]  Radiology Electronically Signed   By: Corrie Mckusick D.O.   On: 09/16/2019 11:57   IR US Guide Vasc Access Right  Result Date: 09/16/2019 INDICATION: 79 year old male with a history of renal failure referred for hemodialysis catheter placement EXAM: TUNNELED CENTRAL VENOUS HEMODIALYSIS CATHETER PLACEMENT WITH ULTRASOUND AND FLUOROSCOPIC GUIDANCE MEDICATIONS: 2 g Ancef. The antibiotic was given in an appropriate time interval prior to skin puncture. ANESTHESIA/SEDATION: Moderate (conscious) sedation was employed during this procedure. A total of Versed 0 mg and Fentanyl 50 mcg was administered intravenously. Moderate Sedation Time: 0 minutes. The patient's level of consciousness and vital signs were monitored continuously by radiology nursing throughout the procedure under my direct supervision. FLUOROSCOPY TIME:  Fluoroscopy Time: 0 minutes 18 seconds (3 mGy). COMPLICATIONS: None PROCEDURE: Informed written consent was obtained from the patient after a discussion of the risks, benefits, and alternatives to treatment. Questions regarding the procedure were encouraged and answered. The right neck and chest were prepped with chlorhexidine in a sterile fashion, and a sterile drape was applied covering the operative field. Maximum barrier sterile technique with sterile gowns and gloves were used for the procedure. A timeout was performed prior to the initiation of the procedure. Ultrasound survey was performed. Right external jugular vein was patent. Right internal jugular vein is occluded at the inlet. Micropuncture kit was utilized to access the right external jugular vein under direct, real-time ultrasound guidance after the overlying soft tissues were anesthetized with 1% lidocaine with epinephrine. Stab incision was made with 11 blade scalpel. Microwire was passed centrally. The microwire was then marked to measure appropriate internal catheter length. External tunneled length was estimated. A total tip to cuff  length of 23 cm was selected. 035 guidewire was advanced to the level of the IVC. Skin and subcutaneous tissues of chest wall below the clavicle were generously infiltrated with 1% lidocaine for local anesthesia. A small stab incision was made with 11 blade scalpel. The selected hemodialysis catheter was tunneled in a retrograde fashion from the anterior chest wall to the venotomy incision. Serial dilation was performed and then a peel-away sheath was placed. The catheter was then placed through the peel-away sheath with tips ultimately positioned within the superior aspect of the right atrium. Final catheter positioning was confirmed and documented with a spot radiographic image. The catheter aspirates and flushes normally. The  catheter was flushed with appropriate volume heparin dwells. The catheter exit site was secured with a 0-Prolene retention suture. Gel-Foam slurry was infused into the soft tissue tract. The venotomy incision was closed Derma bond and sterile dressing. Dressings were applied at the chest wall. Patient tolerated the procedure well and remained hemodynamically stable throughout. No complications were encountered and no significant blood loss encountered. IMPRESSION: Status post image guided placement of right external jugular tunneled hemodialysis catheter. Catheter ready for use. Signed, Dulcy Fanny. Dellia Nims, RPVI [facility] Radiology Electronically Signed   By: Corrie Mckusick D.O.   On: 09/16/2019 11:57        Scheduled Meds: . amiodarone  200 mg Oral Daily  . vitamin C  500 mg Oral Q24H  . atorvastatin  40 mg Oral q1800  . B-complex with vitamin C  1 tablet Oral Daily  . Chlorhexidine Gluconate Cloth  6 each Topical Q0600  . cholecalciferol  1,000 Units Oral Daily  . cinacalcet  60 mg Oral QHS  . darbepoetin (ARANESP) injection - DIALYSIS  100 mcg Intravenous Q Mon-HD  . docusate sodium  100 mg Oral BID  . feeding supplement   1 Container Oral TID BM  . feeding supplement (PRO-STAT SUGAR FREE 64)  30 mL Oral BID  . melatonin  3 mg Oral QHS  . midodrine  10 mg Oral TID WC  . pantoprazole sodium  40 mg Oral BID  . sodium chloride flush  10-40 mL Intracatheter Q12H  . sodium chloride flush  3 mL Intravenous Q12H  . sodium chloride flush  3 mL Intravenous Q12H   Continuous Infusions: . sodium chloride Stopped (09/04/19 1236)  . ferric gluconate (FERRLECIT/NULECIT) IV Stopped (09/15/19 1143)     LOS: 13 days     Phillips Climes, MD [facility]   09/16/2019, 2:17 PM

## 2019-09-16 NOTE — Procedures (Addendum)
Interventional Radiology Procedure Note  Procedure: Placement of a right EJ approach tunneled HD 23 cm tip to cuff catheter.  Tip is positioned at the superior cavoatrial junction and catheter is ready for immediate use.   Complications: None  Recommendations:  - Ok to use  - Do not submerge - Routine care  - left temp IJ triple lumen can be removed by nursing/IV team when done with need for IV  Signed,  Dulcy Fanny. Earleen Newport, DO

## 2019-09-16 NOTE — Plan of Care (Signed)
  Problem: Clinical Measurements: Goal: Ability to maintain clinical measurements within normal limits will improve Outcome: Progressing   Problem: Coping: Goal: Level of anxiety will decrease Outcome: Progressing   

## 2019-09-17 LAB — CBC
HCT: 26.8 % — ABNORMAL LOW (ref 39.0–52.0)
HCT: 31.3 % — ABNORMAL LOW (ref 39.0–52.0)
Hemoglobin: 10 g/dL — ABNORMAL LOW (ref 13.0–17.0)
Hemoglobin: 8.6 g/dL — ABNORMAL LOW (ref 13.0–17.0)
MCH: 20.7 pg — ABNORMAL LOW (ref 26.0–34.0)
MCH: 21 pg — ABNORMAL LOW (ref 26.0–34.0)
MCHC: 31.9 g/dL (ref 30.0–36.0)
MCHC: 32.1 g/dL (ref 30.0–36.0)
MCV: 64.9 fL — ABNORMAL LOW (ref 80.0–100.0)
MCV: 65.5 fL — ABNORMAL LOW (ref 80.0–100.0)
Platelets: 381 10*3/uL (ref 150–400)
Platelets: 386 10*3/uL (ref 150–400)
RBC: 4.09 MIL/uL — ABNORMAL LOW (ref 4.22–5.81)
RBC: 4.82 MIL/uL (ref 4.22–5.81)
RDW: 25.2 % — ABNORMAL HIGH (ref 11.5–15.5)
RDW: 25.3 % — ABNORMAL HIGH (ref 11.5–15.5)
WBC: 23.3 10*3/uL — ABNORMAL HIGH (ref 4.0–10.5)
WBC: 24.5 10*3/uL — ABNORMAL HIGH (ref 4.0–10.5)
nRBC: 0.2 % (ref 0.0–0.2)
nRBC: 0.2 % (ref 0.0–0.2)

## 2019-09-17 LAB — RENAL FUNCTION PANEL
Albumin: 1.7 g/dL — ABNORMAL LOW (ref 3.5–5.0)
Anion gap: 12 (ref 5–15)
BUN: 22 mg/dL (ref 8–23)
CO2: 28 mmol/L (ref 22–32)
Calcium: 7.7 mg/dL — ABNORMAL LOW (ref 8.9–10.3)
Chloride: 97 mmol/L — ABNORMAL LOW (ref 98–111)
Creatinine, Ser: 4.5 mg/dL — ABNORMAL HIGH (ref 0.61–1.24)
GFR calc Af Amer: 14 mL/min — ABNORMAL LOW (ref >60)
GFR calc non Af Amer: 12 mL/min — ABNORMAL LOW (ref >60)
Glucose, Bld: 95 mg/dL (ref 70–99)
Phosphorus: 4 mg/dL (ref 2.5–4.6)
Potassium: 4.4 mmol/L (ref 3.5–5.1)
Sodium: 137 mmol/L (ref 135–145)

## 2019-09-17 MED ORDER — PRO-STAT SUGAR FREE PO LIQD
30.0000 mL | Freq: Three times a day (TID) | ORAL | Status: DC
  Administered 2019-09-17 – 2019-09-23 (×11): 30 mL via ORAL
  Filled 2019-09-17 (×13): qty 30

## 2019-09-17 MED ORDER — ALTEPLASE 2 MG IJ SOLR
2.0000 mg | Freq: Once | INTRAMUSCULAR | Status: AC
  Administered 2019-09-17: 2 mg
  Filled 2019-09-17: qty 2

## 2019-09-17 MED ORDER — MIDODRINE HCL 5 MG PO TABS
ORAL_TABLET | ORAL | Status: AC
  Filled 2019-09-17: qty 2

## 2019-09-17 MED ORDER — HEPARIN SODIUM (PORCINE) 1000 UNIT/ML IJ SOLN
INTRAMUSCULAR | Status: AC
  Administered 2019-09-17: 3800 [IU]
  Filled 2019-09-17: qty 4

## 2019-09-17 NOTE — Progress Notes (Signed)
PROGRESS NOTE    [name]  UVO:[id number] DOB: 09/04/1940 DOA: 09/03/2019 PCP: [name], MD    No chief complaint on file.   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 on 09/03/2019 for above-knee amputation. However in the preoperative area, patient noted to have rectal bleeding for which [facility] was consulted and advised that the procedure will need to be postponed.  Patient was subsequently admitted.  Subjective: Patient reports his appetite has improved, tolerating his dialysis today with no significant events, denies any chest pain or shortness of breath.   Assessment & Plan:   Principal Problem:   GI bleeding Active Problems:   Encounter for central line placement   ESRD on dialysis [facility])   Aortic stenosis   PVD (peripheral vascular disease) ([place])   Essential hypertension   Duodenal ulcer   Palliative care encounter   DNR (do not resuscitate) discussion   Goals of care, counseling/discussion   Dehiscence of amputation stump ([place])   Chronic hypotension   SVT (supraventricular tachycardia) ([facility])   Ventricular ectopy  Upper GI bleed secondary to duodenal ulcer/acute blood  loss anemia Patient noted to have some rectal bleeding in the preop area prior to surgery and subsequently admitted.  Patient transfused a unit of packed red blood cells 09/04/2019.  Plavix currently on hold.  Status post upper endoscopy 09/04/2019 that showed duodenal ulcer which was likely cause of patient's bleeding per GI.  Colonoscopy subsequently was not performed as patient also had a poor prep. -Continue to monitor hemoglobin and transfuse as needed, so far hemoglobin remained stable H&H stable at 7.8.  Continue PPI every 12 hours.  Continue to hold Overall hemoglobin remained stable, will continue to monitor closely. -Now he has his permanent catheter placed, will resume back on Plavix, it is okay to be resumed as discussed with GI.  End-stage renal disease on hemodialysis - Patient noted per nephrology that dressing from wound VAC currently over patient's AV graft. (And currently wound dressing). -IR has been consulted for tunnel dialysis dialysis catheter placement(which has postponed initially given markedly elevated white blood cell, but patient appears nontoxic, with chronic leukocytosis, with no evidence of acute infection, he had his tunneled catheter placed 5/20. -Hemodialysis management per renal.  Progressive gangrenous necrotic changes with stump dehiscence of left transtibial amputation site - Patient seen by orthopedics.  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  Patient initially with wound VAC, has been seen today by orthopedic where it has been discontinued . currently on Prevena boot.  Outpatient follow-up with orthopedics.  Per orthopedics.  Leukocytosis Likely secondary to problem #3.  Leukocytosis was fluctuating but slowly started to trend down. Status post left AKA.  Patient started on IV Zyvox which is subsequently been discontinued..  Repeat blood cultures with no growth to date.  Due to worsening leukocytosis ID consulted who  recommended discontinuation of antibiotics, MRI of the right foot,  and consideration for CT chest abdomen and pelvis if worsening WBC and no cause found.  ID have signed off.   -He still having some significant leukocytosis at 23.3K, he is nontoxic-appearing, this appears to be somehow chronic over now for 2 months  Chronic diastolic heart failure Compensated.  On hemodialysis.  Per nephrology.  Chronic hypotension Midodrine.    SVT Patient noted to have a run of SVT with heart rates as high as the 200s in the PACU postoperatively on 09/08/2019.  Patient received a dose of IV metoprolol and SVT broken and heart rate in the 70s.  Patient started on amiodarone and dose increased to 200 mg daily per cardiology for ventricular ectopy.  Outpatient follow-up with cardiology.    Ventricular ectopy Patient started on amiodarone per cardiology.  Amiodarone dose increased to 200 mg daily per cardiology.  Outpatient follow-up with cardiology.  Follow.  Severe Aortic stenosis Noted on TEE during previous admission.  Patient noted to not be a candidate for TAVR.  Avoid hypotension.  Cardiology was following but have signed off.    Prolonged QTC Repeat EKG with QTC of 509.  Keep magnesium > 2.  Keep potassium greater > 4.  Iron deficiency anemia/anemia of chronic disease -Continue to monitor closely and transfuse as needed   History of CVA Plavix currently on hold.  Could potentially likely resume Plavix after permanent catheter has been placed before discharge.    Generalized deconditioning Patient overall with a poor prognosis.  Palliative care has assessed and followed patient.  Patient now with partial code.  Patient wishes to have CPR and ACLS medication but no intubation or shock.  PT/OT.  Needs SNF.     DVT prophylaxis: Recent GI bleed.  Avoiding chemical prophylaxis.  SCDs. Code Status: Partial Family Communication: Updated patient, no family at bedside.  Disposition:   Status is:  Inpatient    Dispo: The patient is from: SNF              Anticipated d/c is to: SNF              Anticipated d/c date is: 2 days              Patient with a leukocytosis post amputation that is slowly trending down but still elevated.  Seen by ID and antibiotics discontinued.  Needs permanent dialysis catheter which is pending to be placed.        Consultants:   Gastroenterology: Dr. Tarri Glenn 09/03/2019  Nephrology: Dr.Schertz 09/03/2019  Palliative care: Lindell Spar, NP 09/04/2019  Cardiology: Dr. Lovena Le 09/04/2019  Domenic Moras, FNP 09/07/2019  Orthopedics: Dr. Sharol Given  ID: Dr. Tommy Medal 09/10/2019  Procedures:   Chest x-ray 09/03/2019  Upper endoscopy 09/04/2019--per Dr. Tarri Glenn  Left above-the-knee amputation per Dr. Sharol Given 09/08/2019 EGD on 09/04/2019 Impression: - Duodenal ulcer may be the cause of recent overt  GI bleeding. 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 Biopsied. - Erosive gastropathy with no bleeding and no  stigmata of recent bleeding. Biopsied. - A single non-bleeding angioectasia in the  duodenum. - Small hiatal hernia. - Unable to proceed with colonoscopy as planned due  to incomplete preparation. Patient had formed stool  on last bowel movement. Recommendation: - Return patient to hospital ward for ongoing care. - Clear liquid diet. Advance as tolerated. - No aspirin, ibuprofen, naproxen, or other  non-steroidal anti-inflammatory drugs. - Patient has a prolonged QT. Discussed with Dr.  Starla Link who agreed with trial of pantoprazole 40 mg  IV BID with close monitoring of QTc. - Will defer colonoscopy at this time given the  challenges that the patient has prepping for the  procedure. Duodenal ulcer may be the source of  bleeding. - Continue serial hgb/hct with transfusion as  indicated.  Exchange of left IJ central line for triple-lumen HD catheter--per IR Dr. Laurence Ferrari 09/10/2019  MRI right foot 09/10/2019  Antimicrobials:   IV Ancef 09/08/2019 preoperatively  IV Zyvox 09/08/2019>>>>> 09/10/2019    Objective: Vitals:   09/17/19 1235 09/17/19 1245 09/17/19 1345 09/17/19 1442  BP:   (!) 144/104 (!) 96/54  Pulse: 93 94 (!) 113 91  Resp: 17 16 (!) 29 15  Temp:   98.8 F (37.1 C) 99.2 F (37.3 C)  TempSrc:   Axillary Axillary  SpO2: 98% 95% (!) 63% 93%  Weight:      Height:        Intake/Output Summary (Last 24 hours) at 09/17/2019 1607 Last data filed at 09/17/2019 1349 Gross per 24 hour  Intake --  Output -499 ml  Net 499 ml   Filed Weights   09/16/19 0519 09/17/19 0509 09/17/19 0653  Weight: 54.4 kg 53.3 kg 55.2 kg    Examination:  Awake Alert, Oriented X 3, No new F.N deficits, Normal affect Symmetrical Chest wall movement, Good air movement bilaterally, CTAB RRR,No Gallops,Rubs or new Murmurs, No Parasternal Heave +ve B.Sounds, Abd Soft, No tenderness, No rebound - guarding or rigidity. Extremities: Status post left BKA ,  Status post right transmetatarsal amputation.          Data Reviewed: I have personally reviewed following labs and imaging studies  CBC: Recent Labs  Lab 09/11/19 0752 09/11/19 0752 09/12/19 0434 09/12/19 0434 09/13/19 0506 09/14/19 0416 09/15/19 0424 09/17/19 0015 09/17/19 0525  WBC 21.9*   < > 22.5*   < > 24.9* 20.6* 22.9* 24.5* 23.3*  NEUTROABS 18.6*  --  17.9*  --  19.2*  --   17.3*  --   --   HGB 8.0*   < > 7.9*   < > 8.8* 7.8* 8.0* 10.0* 8.6*  HCT 24.9*   < > 24.9*   < > 27.8* 24.8* 25.4* 31.3* 26.8*  MCV 64.5*   < > 64.8*   < > 65.9* 65.6* 66.3* 64.9* 65.5*  PLT 302   < > 302   < > PLATELET CLUMPS NOTED ON SMEAR, UNABLE TO ESTIMATE 309 349 386 381   < > = values in this interval not displayed.    Basic Metabolic Panel: Recent Labs  Lab 09/11/19 0752 09/11/19 0752 09/12/19 0434 09/13/19 0506 09/14/19 0416 09/15/19 0424 09/17/19 0716  NA 136   < > 137 136 136 136 137  K 3.7   < > 4.0 5.1 3.7 4.0 4.4  CL 99   < > 100 103 100 96* 97*  CO2 28   < > _0 GLUCOSE 116*   < > 92 83 94 95 95  BUN 14   < > 20 28* _1 CREATININE 3.36*   < > 4.50* 5.86* 3.18* 4.61* 4.50*  CALCIUM 7.4*   < > 7.9* 7.6* 7.4* 7.7* 7.7*  PHOS 2.4*  --  3.4 4.0  --  3.5 4.0   < > = values in this interval not displayed.    GFR: Estimated Creatinine Clearance: 10.9 mL/min (A) (by C-G formula based on SCr of 4.5 mg/dL (H)).  Liver Function Tests: Recent Labs  Lab 09/11/19 0752 09/12/19 0434 09/13/19 0506 09/15/19 0424 09/17/19 0716  ALBUMIN 1.5* 1.5* 1.5* 1.7* 1.7*    CBG: Recent Labs  Lab 09/15/19 0625 09/15/19 0641  GLUCAP 96 120*     Recent Results (from the past 240 hour(s))  Surgical pcr screen     Status: None   Collection Time: 09/07/19  7:45 PM   Specimen: Nasal Mucosa; Nasal Swab  Result Value Ref Range Status   MRSA, PCR NEGATIVE NEGATIVE Final   Staphylococcus aureus NEGATIVE NEGATIVE Final    Comment: (NOTE) The Xpert SA Assay (FDA approved for NASAL specimens in patients 63 years of age and older), is one component of a comprehensive surveillance program. It is not intended to diagnose infection nor to guide or monitor treatment. Performed at [facility], [place] [address], [place], [place] [address]   Culture, blood (Routine X 2) w Reflex to ID Panel     Status: None   Collection Time: 09/10/19  9:20 AM   Specimen:  BLOOD RIGHT HAND  Result Value Ref Range Status   Specimen Description BLOOD RIGHT HAND  Final   Special Requests   Final    BOTTLES DRAWN AEROBIC ONLY Blood Culture results may not be optimal due to an inadequate volume of blood received in culture bottles   Culture   Final    NO GROWTH 5 DAYS Performed at [facility], [place] [address], [place], [place] [address]    Report Status 09/15/2019 FINAL  Final  Culture, blood (Routine X 2) w Reflex to ID Panel     Status: None   Collection Time: 09/10/19 10:15 AM   Specimen: BLOOD RIGHT ARM  Result Value Ref Range Status   Specimen Description BLOOD RIGHT ARM  Final   Special Requests   Final    BOTTLES DRAWN AEROBIC ONLY Blood Culture adequate volume   Culture   Final    NO GROWTH 5 DAYS Performed at [facility], [place] [address] [place], [place] [address]    Report Status 09/15/2019 FINAL  Final         Radiology Studies: IR Fluoro Guide CV Line Right  Result Date: 09/16/2019 INDICATION: 79 year old male with a history of renal failure referred for hemodialysis catheter placement EXAM: TUNNELED CENTRAL VENOUS HEMODIALYSIS CATHETER PLACEMENT WITH ULTRASOUND AND FLUOROSCOPIC GUIDANCE MEDICATIONS: 2 g Ancef. The antibiotic was given in an appropriate time interval prior to skin puncture. ANESTHESIA/SEDATION: Moderate (conscious) sedation was employed during this procedure. A total of Versed 0 mg and Fentanyl 50 mcg was administered intravenously. Moderate Sedation Time: 0 minutes. The patient's level of consciousness and vital signs were monitored continuously by radiology nursing throughout the procedure under my direct supervision. FLUOROSCOPY TIME:  Fluoroscopy Time: 0 minutes 18 seconds (3 mGy). COMPLICATIONS: None PROCEDURE: Informed written consent was obtained from the patient after a discussion of the risks, benefits, and alternatives to treatment. Questions regarding the procedure were encouraged and answered. The right  neck and chest were prepped with chlorhexidine in a sterile fashion, and a sterile drape was applied covering the operative field. Maximum barrier sterile technique with sterile gowns and gloves were used for the procedure. A timeout was performed prior to the initiation of the procedure. Ultrasound survey was performed. Right external jugular vein was patent. Right internal jugular vein is occluded at the inlet. Micropuncture kit was utilized to access the right external jugular vein under direct, real-time ultrasound guidance after the overlying soft tissues  were anesthetized with 1% lidocaine with epinephrine. Stab incision was made with 11 blade scalpel. Microwire was passed centrally. The microwire was then marked to measure appropriate internal catheter length. External tunneled length was estimated. A total tip to cuff length of 23 cm was selected. 035 guidewire was advanced to the level of the IVC. Skin and subcutaneous tissues of chest wall below the clavicle were generously infiltrated with 1% lidocaine for local anesthesia. A small stab incision was made with 11 blade scalpel. The selected hemodialysis catheter was tunneled in a retrograde fashion from the anterior chest wall to the venotomy incision. Serial dilation was performed and then a peel-away sheath was placed. The catheter was then placed through the peel-away sheath with tips ultimately positioned within the superior aspect of the right atrium. Final catheter positioning was confirmed and documented with a spot radiographic image. The catheter aspirates and flushes normally. The catheter was flushed with appropriate volume heparin dwells. The catheter exit site was secured with a 0-Prolene retention suture. Gel-Foam slurry was infused into the soft tissue tract. The venotomy incision was closed Derma bond and sterile dressing. Dressings were applied at the chest wall. Patient tolerated the procedure well and remained hemodynamically stable  throughout. No complications were encountered and no significant blood loss encountered. IMPRESSION: Status post image guided placement of right external jugular tunneled hemodialysis catheter. Catheter ready for use. Signed, Dulcy Fanny. Dellia Nims, RPVI [facility] Radiology Electronically Signed   By: Corrie Mckusick D.O.   On: 09/16/2019 11:57   IR US Guide Vasc Access Right  Result Date: 09/16/2019 INDICATION: 79 year old male with a history of renal failure referred for hemodialysis catheter placement EXAM: TUNNELED CENTRAL VENOUS HEMODIALYSIS CATHETER PLACEMENT WITH ULTRASOUND AND FLUOROSCOPIC GUIDANCE MEDICATIONS: 2 g Ancef. The antibiotic was given in an appropriate time interval prior to skin puncture. ANESTHESIA/SEDATION: Moderate (conscious) sedation was employed during this procedure. A total of Versed 0 mg and Fentanyl 50 mcg was administered intravenously. Moderate Sedation Time: 0 minutes. The patient's level of consciousness and vital signs were monitored continuously by radiology nursing throughout the procedure under my direct supervision. FLUOROSCOPY TIME:  Fluoroscopy Time: 0 minutes 18 seconds (3 mGy). COMPLICATIONS: None PROCEDURE: Informed written consent was obtained from the patient after a discussion of the risks, benefits, and alternatives to treatment. Questions regarding the procedure were encouraged and answered. The right neck and chest were prepped with chlorhexidine in a sterile fashion, and a sterile drape was applied covering the operative field. Maximum barrier sterile technique with sterile gowns and gloves were used for the procedure. A timeout was performed prior to the initiation of the procedure. Ultrasound survey was performed. Right external jugular vein was patent. Right internal jugular vein is occluded at the inlet. Micropuncture kit was utilized to access the right external jugular vein under direct, real-time ultrasound  guidance after the overlying soft tissues were anesthetized with 1% lidocaine with epinephrine. Stab incision was made with 11 blade scalpel. Microwire was passed centrally. The microwire was then marked to measure appropriate internal catheter length. External tunneled length was estimated. A total tip to cuff length of 23 cm was selected. 035 guidewire was advanced to the level of the IVC. Skin and subcutaneous tissues of chest wall below the clavicle were generously infiltrated with 1% lidocaine for local anesthesia. A small stab incision was made with 11 blade scalpel. The selected hemodialysis catheter was tunneled in a retrograde fashion from the anterior chest wall to the  venotomy incision. Serial dilation was performed and then a peel-away sheath was placed. The catheter was then placed through the peel-away sheath with tips ultimately positioned within the superior aspect of the right atrium. Final catheter positioning was confirmed and documented with a spot radiographic image. The catheter aspirates and flushes normally. The catheter was flushed with appropriate volume heparin dwells. The catheter exit site was secured with a 0-Prolene retention suture. Gel-Foam slurry was infused into the soft tissue tract. The venotomy incision was closed Derma bond and sterile dressing. Dressings were applied at the chest wall. Patient tolerated the procedure well and remained hemodynamically stable throughout. No complications were encountered and no significant blood loss encountered. IMPRESSION: Status post image guided placement of right external jugular tunneled hemodialysis catheter. Catheter ready for use. Signed, Dulcy Fanny. Dellia Nims, RPVI [facility] Radiology Electronically Signed   By: Corrie Mckusick D.O.   On: 09/16/2019 11:57        Scheduled Meds: . amiodarone  200 mg Oral Daily  . vitamin C  500 mg Oral Q24H  . atorvastatin  40 mg Oral q1800  .  B-complex with vitamin C  1 tablet Oral Daily  . Chlorhexidine Gluconate Cloth  6 each Topical Q0600  . cholecalciferol  1,000 Units Oral Daily  . cinacalcet  60 mg Oral QHS  . clopidogrel  75 mg Oral Daily  . darbepoetin (ARANESP) injection - DIALYSIS  100 mcg Intravenous Q Mon-HD  . docusate sodium  100 mg Oral BID  . feeding supplement (PRO-STAT SUGAR FREE 64)  30 mL Oral TID  . melatonin  3 mg Oral QHS  . midodrine      . midodrine  10 mg Oral TID WC  . pantoprazole sodium  40 mg Oral BID  . sodium chloride flush  10-40 mL Intracatheter Q12H  . sodium chloride flush  3 mL Intravenous Q12H  . sodium chloride flush  3 mL Intravenous Q12H   Continuous Infusions: . sodium chloride Stopped (09/04/19 1236)     LOS: 14 days     Phillips Climes, MD [facility]   09/17/2019, 4:07 PM

## 2019-09-17 NOTE — Progress Notes (Signed)
Pt was difficult lab stick for CBC but was able to get labs drawn. Lab called back and stated that pts hgb had went from 8.0 on 5/19, to 10.0 on 5/21. This is considered an abnormal increase so another order was placed for CBC to be redrawn, spoke with IV team and they will be redrawing from HD cath.

## 2019-09-17 NOTE — Progress Notes (Signed)
Nutrition Follow-up  DOCUMENTATION CODES:   Severe malnutrition in context of chronic illness  INTERVENTION:  Provide 30 ml Prostat po TID, each supplement provides 100 kcal and 15 grams of protein.   Continue Magic cup TID with meals, each supplement provides 290 kcal and 9 grams of protein  Encourage adequate PO intake.  NUTRITION DIAGNOSIS:   Severe Malnutrition related to chronic illness(ESRD on HD, CHF, non-healing wounds) as evidenced by severe fat depletion, severe muscle depletion, percent weight loss(22.7% weight loss in 7 months); ongoing  GOAL:   Patient will meet greater than or equal to 90% of their needs; progressing  MONITOR:   PO intake, Supplement acceptance, Labs, Weight trends, Skin, I & O's  REASON FOR ASSESSMENT:   Malnutrition Screening Tool    ASSESSMENT:   79 year old male who presented on 5/07 with left abduction wound dehiscence and scheduled AKA. PMH of PVD, DM, ESRD on HD, CHF, HTN, HLD, s/p multiple finger amputations, s/p right transmetatarsal amputation, s/p left transtibial amputation. Surgery postponed and pt admitted with acute lower GI bleed.   5/08 - s/p EGD showing duodenal ulcer  5/12 - L AKA with wound VAC 5/20 - Placement of a right EJ approach tunneled HD  Meal completion has been varied from 25-65%. Pt reports appetite is fine. Pt currently has Boost Breeze ordered and has been refusing them stating they cause him abdominal discomfort and not able to tolerate the nutritional drink. Pt has Prostat ordered and has been consuming most of them. RD to discontinue Boost Breeze and increase Prostat to TID. Noted pt with multiple snacks available at bedside. Pt encouraged to his food at meals and to drink his supplements. Labs and medications reviewed.   Diet Order:   Diet Order            Diet renal with fluid restriction Fluid restriction: 1200 mL Fluid; Room service appropriate? No; Fluid consistency: Thin  Diet effective now               EDUCATION NEEDS:   Education needs have been addressed  Skin:  Skin Assessment: Skin Integrity Issues: Skin Integrity Issues:: Stage II, Incisions Stage II: sacrum Unstageable: N/A Wound Vac: N/A Incisions: L thigh  Last BM:  5/19  Height:   Ht Readings from Last 1 Encounters:  09/08/19 5' 5.98" (1.676 m)    Weight:   Wt Readings from Last 1 Encounters:  09/17/19 55.2 kg    BMI:  Body mass index is 19.65 kg/m.  Estimated Nutritional Needs:   Kcal:  1700-1900  Protein:  80-95 grams  Fluid:  1000 ml + UOP    Corrin Parker, MS, RD, LDN RD pager number/after hours weekend pager number on Amion.

## 2019-09-17 NOTE — Progress Notes (Signed)
Physical Therapy Treatment Patient Details Name: [name]. MRN: [id number] DOB: [birth year] Today's Date: 09/17/2019    History of Present Illness Pt is a 79 y/o male now s/p L transfemoral amputation on 5/12. On admission, pt incidentally found to have rectal bleeding. PMH including but not limited to R LE transmetatarsal amp, HTN, arthritis, CVA with residual L sided weakness, arthritis, anxiety, ESRD M/W/F, recent fall with R hip avulasion fx WBAT with no medical mgmt.  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    PT Comments    Pt was lethargic, but aroused with interaction.  He needed clean up initially, so worked on rolling.  Transitions, sitting balance were emphasized before return to supine.  Pt then completed exercise bil to strengthen up for OOB activity.   Follow Up Recommendations  SNF     Equipment Recommendations  None recommended by PT    Recommendations for Other Services       Precautions / Restrictions Restrictions RLE Weight Bearing: Weight bearing as tolerated LLE Weight Bearing: Non weight bearing    Mobility  Bed Mobility Overal bed mobility: Needs Assistance Bed Mobility: Rolling;Sidelying to Sit;Sit to Supine Rolling: Min assist Sidelying to sit: Max assist   Sit to supine: Max assist   General bed mobility comments: cues and hand over hand assist of pt's UE positioning, truncal assist up via R elbow.  Transfers Overall transfer level: Needs assistance   Transfers: Lateral/Scoot Transfers          Lateral/Scoot Transfers: Max assist General transfer comment: face to face assist with cues for hand placement and positioning.  Ambulation/Gait                 Stairs             Wheelchair Mobility    Modified Rankin (Stroke Patients Only)       Balance Overall balance assessment: Needs assistance Sitting-balance support: Feet supported Sitting balance-Leahy Scale:  Poor Sitting balance - Comments: work about 10 min on gaining midline balance with upright posture.  Pt could hold for short periods then completely lose balance to the L needing mod assist to regain.  Pt worked on w/shift and kicking R leg out while controlling balance with min assist.                                    Cognition Arousal/Alertness: Awake/alert Behavior During Therapy: WFL for tasks assessed/performed Overall Cognitive Status: No family/caregiver present to determine baseline cognitive functioning                     Current Attention Level: Selective Memory: Decreased recall of precautions;Decreased short-term memory Following Commands: Follows one step commands with increased time Safety/Judgement: Decreased awareness of safety;Decreased awareness of deficits Awareness: Intellectual Problem Solving: Slow processing;Decreased initiation;Difficulty sequencing;Requires verbal cues;Requires tactile cues        Exercises General Exercises - Lower Extremity Ankle Circles/Pumps: AROM;10 reps Heel Slides: AROM;10 reps;Supine;Other (comment)(resisted) Hip ABduction/ADduction: AROM;15 reps;Supine;Right Straight Leg Raises: AROM;Right;10 reps;Supine    General Comments General comments (skin integrity, edema, etc.): BP sitting rose to 140's /110's, HR 102 bpm  sats low 90's      Pertinent Vitals/Pain Pain Assessment: Faces Faces Pain Scale: Hurts little more Pain Location: buttocks and L stump Pain Descriptors / Indicators: Discomfort Pain Intervention(s): Monitored during session  Home Living                      Prior Function            PT Goals (current goals can now be found in the care plan section) Acute Rehab PT Goals Patient Stated Goal: get stronger PT Goal Formulation: With patient Time For Goal Achievement: 09/19/19 Potential to Achieve Goals: Fair Progress towards PT goals: Progressing toward goals     Frequency    Min 2X/week      PT Plan      Co-evaluation              AM-PAC PT "6 Clicks" Mobility   Outcome Measure  Help needed turning from your back to your side while in a flat bed without using bedrails?: A Lot Help needed moving from lying on your back to sitting on the side of a flat bed without using bedrails?: A Lot Help needed moving to and from a bed to a chair (including a wheelchair)?: A Lot Help needed standing up from a chair using your arms (e.g., wheelchair or bedside chair)?: A Lot Help needed to walk in hospital room?: Total Help needed climbing 3-5 steps with a railing? : Total 6 Click Score: 10    End of Session         PT Visit Diagnosis: Other abnormalities of gait and mobility (R26.89);Muscle weakness (generalized) (M62.81);Pain Pain - part of body: Leg     Time: 1329-1401 PT Time Calculation (min) (ACUTE ONLY): 32 min  Charges:  $Therapeutic Exercise: 8-22 mins $Therapeutic Activity: 8-22 mins                     09/17/2019  Ginger Carne., PT Acute Rehabilitation Services [phone]  (pager) [phone]  (office)   Tessie Fass Shahrukh Pasch 09/17/2019, 4:17 PM

## 2019-09-17 NOTE — Progress Notes (Signed)
[facility] Progress Note   Dialysis Orders: [facility]- MWF- 4 hours- Thigh AVG- BFR 400/DFR800- 2 K bath as OP- EDW 62. Gets heparin per tx- 2000 units per hour, epo 4000 and sensipar 60 mg. Last hgb there was 4/26 9.9  Assessment/ Plan:   1. GIB- EGD w/duodenal bulb ulcer. Gastric bx without malignancy or H. pylori. PPI per primary team. Hemoglobin relatively stable at this time 2. 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 Wound VAC in place.Graft does not have  a bruit this am for the 1st time. 3. Ventricular ectopy- asymptomatic. On amiodarone orally 4. ESRD- On HD MWF.Tolerating dialysis well with no issues. Continue to per his home schedule.Absolutely needs midodrine before HD.  HD 5/17 (284 UF), 5/19 (net UF 212)   Seen on HD today. 2K bath UF goal 500 net, 97/30.  Appreciate VIR placing new REJ  TC 5/20; plan on pulling the left sided cath tomorrow.  High white count likely inflammation, demargination. S/p amputation and source is no longer present, also no fevers.  5. Dialysis Access: Left lower extremity thigh graft unable to access because of wound VAC but for 1st time today (5/20) no bruit. Has temporary catheter because of leukocytosis. Would proceed with tunneled dialysis catheter when able(WBC still markedly elevated).   - D/w VVS on 5/20 (Dr. Carlis Abbott) but likely should wait a few weeks before attempting a declot given recent surgery on that limb; would like to let the wound heal fully before attempting a declot.  6. Hypotension/volume-no signs of significant volume overload on midodrine TID.  7. Anemiaof CKD-Hgb stable in 8s s/p 1unit pRBC. Status post iron for low T sat of 11%. Continue ESA. 8. Secondary Hyperparathyroidism -calcium and phosphorus acceptable. Continue sensipar- does not get vit D with HD-phosphorus slightly low(not on binder)Continue to monitor 9. Nutrition- Renal  diet w/fluid restrictions  Subjective:   Poor appetite, denies n/v/dyspnea    Objective:   BP (!) 97/40 (BP Location: Right Arm)   Pulse 64   Temp 97.8 F (36.6 C) (Oral)   Resp 17   Ht 5' 5.98" (1.676 m)   Wt 55.2 kg   SpO2 93%   BMI 19.65 kg/m   Intake/Output Summary (Last 24 hours) at 09/17/2019 0947 Last data filed at 09/16/2019 1600 Gross per 24 hour  Intake 120 ml  Output -  Net 120 ml   Weight change: -2.5 kg  Physical Exam: General:chronically ill appearing, frail male in NAD Heart:RRR, +1/6 systolic murmur Lungs:CTA anteriolaterally, bilateral chest rise Abdomen:soft, NTND Extremities:L AKA with wound vac Dialysis Access: L thigh AVG NO bruit today, wound vac out, REJ TC, LIJtemp cath  Imaging: IR Fluoro Guide CV Line Right  Result Date: 09/16/2019 INDICATION: 79 year old male with a history of renal failure referred for hemodialysis catheter placement EXAM: TUNNELED CENTRAL VENOUS HEMODIALYSIS CATHETER PLACEMENT WITH ULTRASOUND AND FLUOROSCOPIC GUIDANCE MEDICATIONS: 2 g Ancef. The antibiotic was given in an appropriate time interval prior to skin puncture. ANESTHESIA/SEDATION: Moderate (conscious) sedation was employed during this procedure. A total of Versed 0 mg and Fentanyl 50 mcg was administered intravenously. Moderate Sedation Time: 0 minutes. The patient's level of consciousness and vital signs were monitored continuously by radiology nursing throughout the procedure under my direct supervision. FLUOROSCOPY TIME:  Fluoroscopy Time: 0 minutes 18 seconds (3 mGy). COMPLICATIONS: None PROCEDURE: Informed written consent was obtained from the patient after a discussion of the risks, benefits, and alternatives to treatment. Questions regarding the procedure were  encouraged and answered. The right neck and chest were prepped with chlorhexidine in a sterile fashion, and a sterile drape was applied covering the operative field. Maximum barrier sterile technique with  sterile gowns and gloves were used for the procedure. A timeout was performed prior to the initiation of the procedure. Ultrasound survey was performed. Right external jugular vein was patent. Right internal jugular vein is occluded at the inlet. Micropuncture kit was utilized to access the right external jugular vein under direct, real-time ultrasound guidance after the overlying soft tissues were anesthetized with 1% lidocaine with epinephrine. Stab incision was made with 11 blade scalpel. Microwire was passed centrally. The microwire was then marked to measure appropriate internal catheter length. External tunneled length was estimated. A total tip to cuff length of 23 cm was selected. 035 guidewire was advanced to the level of the IVC. Skin and subcutaneous tissues of chest wall below the clavicle were generously infiltrated with 1% lidocaine for local anesthesia. A small stab incision was made with 11 blade scalpel. The selected hemodialysis catheter was tunneled in a retrograde fashion from the anterior chest wall to the venotomy incision. Serial dilation was performed and then a peel-away sheath was placed. The catheter was then placed through the peel-away sheath with tips ultimately positioned within the superior aspect of the right atrium. Final catheter positioning was confirmed and documented with a spot radiographic image. The catheter aspirates and flushes normally. The catheter was flushed with appropriate volume heparin dwells. The catheter exit site was secured with a 0-Prolene retention suture. Gel-Foam slurry was infused into the soft tissue tract. The venotomy incision was closed Derma bond and sterile dressing. Dressings were applied at the chest wall. Patient tolerated the procedure well and remained hemodynamically stable throughout. No complications were encountered and no significant blood loss encountered. IMPRESSION: Status post image guided placement of right external jugular tunneled  hemodialysis catheter. Catheter ready for use. Signed, Dulcy Fanny. Dellia Nims, RPVI [facility] Radiology Electronically Signed   By: Corrie Mckusick D.O.   On: 09/16/2019 11:57   IR US Guide Vasc Access Right  Result Date: 09/16/2019 INDICATION: 79 year old male with a history of renal failure referred for hemodialysis catheter placement EXAM: TUNNELED CENTRAL VENOUS HEMODIALYSIS CATHETER PLACEMENT WITH ULTRASOUND AND FLUOROSCOPIC GUIDANCE MEDICATIONS: 2 g Ancef. The antibiotic was given in an appropriate time interval prior to skin puncture. ANESTHESIA/SEDATION: Moderate (conscious) sedation was employed during this procedure. A total of Versed 0 mg and Fentanyl 50 mcg was administered intravenously. Moderate Sedation Time: 0 minutes. The patient's level of consciousness and vital signs were monitored continuously by radiology nursing throughout the procedure under my direct supervision. FLUOROSCOPY TIME:  Fluoroscopy Time: 0 minutes 18 seconds (3 mGy). COMPLICATIONS: None PROCEDURE: Informed written consent was obtained from the patient after a discussion of the risks, benefits, and alternatives to treatment. Questions regarding the procedure were encouraged and answered. The right neck and chest were prepped with chlorhexidine in a sterile fashion, and a sterile drape was applied covering the operative field. Maximum barrier sterile technique with sterile gowns and gloves were used for the procedure. A timeout was performed prior to the initiation of the procedure. Ultrasound survey was performed. Right external jugular vein was patent. Right internal jugular vein is occluded at the inlet. Micropuncture kit was utilized to access the right external jugular vein under direct, real-time ultrasound guidance after the overlying soft tissues were anesthetized with 1% lidocaine with epinephrine. Stab incision was made  with 11 blade scalpel. Microwire was passed  centrally. The microwire was then marked to measure appropriate internal catheter length. External tunneled length was estimated. A total tip to cuff length of 23 cm was selected. 035 guidewire was advanced to the level of the IVC. Skin and subcutaneous tissues of chest wall below the clavicle were generously infiltrated with 1% lidocaine for local anesthesia. A small stab incision was made with 11 blade scalpel. The selected hemodialysis catheter was tunneled in a retrograde fashion from the anterior chest wall to the venotomy incision. Serial dilation was performed and then a peel-away sheath was placed. The catheter was then placed through the peel-away sheath with tips ultimately positioned within the superior aspect of the right atrium. Final catheter positioning was confirmed and documented with a spot radiographic image. The catheter aspirates and flushes normally. The catheter was flushed with appropriate volume heparin dwells. The catheter exit site was secured with a 0-Prolene retention suture. Gel-Foam slurry was infused into the soft tissue tract. The venotomy incision was closed Derma bond and sterile dressing. Dressings were applied at the chest wall. Patient tolerated the procedure well and remained hemodynamically stable throughout. No complications were encountered and no significant blood loss encountered. IMPRESSION: Status post image guided placement of right external jugular tunneled hemodialysis catheter. Catheter ready for use. Signed, Dulcy Fanny. Dellia Nims, RPVI [facility] Radiology Electronically Signed   By: Corrie Mckusick D.O.   On: 09/16/2019 11:57    Labs: BMET Recent Labs  Lab 09/11/19 0752 09/12/19 0434 09/13/19 0506 09/14/19 0416 09/15/19 0424 09/17/19 0716  NA 136 137 136 136 136 137  K 3.7 4.0 5.1 3.7 4.0 4.4  CL 99 100 103 100 96* 97*  CO2 _0 GLUCOSE 116* 92 83 94 95 95  BUN 14 20 28* _1 CREATININE 3.36* 4.50* 5.86* 3.18* 4.61* 4.50*  CALCIUM 7.4* 7.9* 7.6* 7.4* 7.7* 7.7*  PHOS 2.4* 3.4 4.0  --  3.5 4.0   CBC Recent Labs  Lab 09/11/19 0752 09/11/19 0752 09/12/19 0434 09/12/19 0434 09/13/19 0506 09/13/19 0506 09/14/19 0416 09/15/19 0424 09/17/19 0015 09/17/19 0525  WBC 21.9*   < > 22.5*   < > 24.9*   < > 20.6* 22.9* 24.5* 23.3*  NEUTROABS 18.6*  --  17.9*  --  19.2*  --   --  17.3*  --   --   HGB 8.0*   < > 7.9*   < > 8.8*   < > 7.8* 8.0* 10.0* 8.6*  HCT 24.9*   < > 24.9*   < > 27.8*   < > 24.8* 25.4* 31.3* 26.8*  MCV 64.5*   < > 64.8*   < > 65.9*   < > 65.6* 66.3* 64.9* 65.5*  PLT 302   < > 302   < > PLATELET CLUMPS NOTED ON SMEAR, UNABLE TO ESTIMATE   < > 309 349 386 381   < > = values in this interval not displayed.    Medications:    . amiodarone  200 mg Oral Daily  . vitamin C  500 mg Oral Q24H  . atorvastatin  40 mg Oral q1800  . B-complex with vitamin C  1 tablet Oral Daily  . Chlorhexidine Gluconate Cloth  6 each Topical Q0600  . cholecalciferol  1,000 Units Oral Daily  . cinacalcet  60 mg Oral QHS  . clopidogrel  75 mg Oral Daily  .  darbepoetin (ARANESP) injection - DIALYSIS  100 mcg Intravenous Q Mon-HD  . docusate sodium  100 mg Oral BID  . feeding supplement  1 Container Oral TID BM  . feeding supplement (PRO-STAT SUGAR FREE 64)  30 mL Oral BID  . melatonin  3 mg Oral QHS  . midodrine      . midodrine  10 mg Oral TID WC  . pantoprazole sodium  40 mg Oral BID  . sodium chloride flush  10-40 mL Intracatheter Q12H  . sodium chloride flush  3 mL Intravenous Q12H  . sodium chloride flush  3 mL Intravenous Q12H      Otelia Santee, MD 09/17/2019, 9:47 AM

## 2019-09-18 MED ORDER — PANTOPRAZOLE SODIUM 40 MG PO TBEC
40.0000 mg | DELAYED_RELEASE_TABLET | Freq: Every day | ORAL | Status: DC
Start: 2019-09-18 — End: 2019-09-20

## 2019-09-18 MED ORDER — AMIODARONE HCL 200 MG PO TABS: 200.0000 mg | ORAL_TABLET | Freq: Every day | ORAL | Status: DC

## 2019-09-18 MED ORDER — OXYCODONE HCL 5 MG PO TABS: 5.0000 mg | ORAL_TABLET | Freq: Four times a day (QID) | ORAL | 0 refills | Status: DC | PRN

## 2019-09-18 MED ORDER — CLOPIDOGREL BISULFATE 75 MG PO TABS: 75.0000 mg | ORAL_TABLET | Freq: Every day | ORAL | Status: DC

## 2019-09-18 MED ORDER — ACETAMINOPHEN 325 MG PO TABS: 650.0000 mg | ORAL_TABLET | Freq: Four times a day (QID) | ORAL | Status: AC | PRN

## 2019-09-18 NOTE — TOC Progression Note (Signed)
Transition of Care (TOC) - Progression Note    Patient Details  Name: Joseph UMAR Sr. MRN: [id number] Date of Birth: 10-15-1940  Transition of Care [facility]) CM/SW [place], [place] Phone Number: 09/18/2019, 12:11 PM  Clinical Narrative:     CSW reached out to [facility]. CSW was told that they will not have a bed available until Monday. CSW informed doctor.   Expected Discharge Plan: [place] Barriers to Discharge: Continued Medical Work up  Expected Discharge Plan and Services Expected Discharge Plan: [place] In-house Referral: Clinical Social Work   Post Acute Care Choice: [facility] Living arrangements for the past 2 months: [place] Expected Discharge Date: 09/18/19                                     Social Determinants of Health (SDOH) Interventions    Readmission Risk Interventions Readmission Risk Prevention Plan 09/10/2019 09/08/2019 09/08/2019  Transportation Screening Complete - Complete  PCP or Specialist Appt within 3-5 Days - - Complete  HRI or [place] - - Complete  Social Work Consult for [place] Planning/Counseling - - Complete  Palliative Care Screening - Complete Not Applicable  Medication Review Press photographer) - - Complete  Some recent data might be hidden   Emeterio Reeve, Latanya Presser, [place] Social Worker [phone]

## 2019-09-18 NOTE — Progress Notes (Signed)
[facility] Progress Note   Dialysis Orders: [facility]- MWF- 4 hours- Thigh AVG- BFR 400/DFR800- 2 K bath as OP- EDW 62. Gets heparin per tx- 2000 units per hour, epo 4000 and sensipar 60 mg. Last hgb there was 4/26 9.9  Assessment/ Plan:   1. GIB- EGD w/duodenal bulb ulcer. Gastric bx without malignancy or H. pylori. PPI per primary team. Hemoglobin relatively stable at this time 2. 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 Wound VAC out -> staples.Graftdoes not have abruit 5/20 am for the 1st time.  3. Ventricular ectopy- asymptomatic. On amiodarone orally 4. ESRD- On HD MWF.Tolerating dialysis well with no issues. Continue to per his home schedule.Absolutely needs midodrine before HD.  HD 5/17(284 UF), 5/19 (net UF 212) 5/21 (+500)  Next HD mon if he's still here.  Appreciate VIR placing new REJ  TC 5/20;  pulled the left sided temp cath 5/22 and held pressure for 15 min + pressure bandage + nurse instruction (minimal blood in sponge most of which was to clean up blood that had leaked onto his shoulder).  High white count likely inflammation, demargination. S/p amputation and source is no longer present, also no fevers.  5. Dialysis Access: Left lower extremity thigh graft unable to access because of wound VACbut for 1st time today (5/20) no bruit. Had temporary catheter because of leukocytosis -> REJ TC on 5/20 + LIJ temp out 5/22.   -D/w VVSon 5/20 (Dr. Arnette Schaumann likely should wait a few weeks before attempting a declot given recent surgery on that limb; would like to let the wound heal fully before attempting a declot. In addition some blood seepage bet staples overnight so another reason not to perform declot immediately. Would wait 2-4 weeks before attempting declot.   6. Hypotension/volume-no signs of significant volume overload on midodrine TID.  7. Anemiaof CKD-Hgb stable in 8s s/p 1unit pRBC.  Status post iron for low T sat of 11%. Continue ESA. 8. Secondary Hyperparathyroidism -calcium and phosphorus acceptable. Continue sensipar- does not get vit D with HD-phosphorus slightly low(not on binder)Continue to monitor 9. Nutrition- Renal diet w/fluid restrictions  Subjective:   Poor appetite, denies n/v/dyspnea    Objective:   BP (!) 102/56 (BP Location: Right Arm)   Pulse 95   Temp 98.6 F (37 C) (Oral)   Resp 17   Ht 5' 5.98" (1.676 m)   Wt 55.2 kg   SpO2 97%   BMI 19.65 kg/m   Intake/Output Summary (Last 24 hours) at 09/18/2019 0754 Last data filed at 09/17/2019 1728 Gross per 24 hour  Intake 120 ml  Output -499 ml  Net 619 ml   Weight change:   Physical Exam: General:chronically ill appearing, frail male in NAD Heart:RRR, +1/6 systolic murmur Lungs:CTA anteriolaterally, bilateral chest rise Abdomen:soft, NTND Extremities:L AKA with staples Dialysis Access: L thigh AVGNObruit , wound vac out now w/ staples (clean with no blood when I examined this am)  REJ TC  Imaging: IR Fluoro Guide CV Line Right  Result Date: 09/16/2019 INDICATION: 79 year old male with a history of renal failure referred for hemodialysis catheter placement EXAM: TUNNELED CENTRAL VENOUS HEMODIALYSIS CATHETER PLACEMENT WITH ULTRASOUND AND FLUOROSCOPIC GUIDANCE MEDICATIONS: 2 g Ancef. The antibiotic was given in an appropriate time interval prior to skin puncture. ANESTHESIA/SEDATION: Moderate (conscious) sedation was employed during this procedure. A total of Versed 0 mg and Fentanyl 50 mcg was administered intravenously. Moderate Sedation Time: 0 minutes. The patient's level of consciousness and vital  signs were monitored continuously by radiology nursing throughout the procedure under my direct supervision. FLUOROSCOPY TIME:  Fluoroscopy Time: 0 minutes 18 seconds (3 mGy). COMPLICATIONS: None PROCEDURE: Informed written consent was obtained from the patient after a discussion of the  risks, benefits, and alternatives to treatment. Questions regarding the procedure were encouraged and answered. The right neck and chest were prepped with chlorhexidine in a sterile fashion, and a sterile drape was applied covering the operative field. Maximum barrier sterile technique with sterile gowns and gloves were used for the procedure. A timeout was performed prior to the initiation of the procedure. Ultrasound survey was performed. Right external jugular vein was patent. Right internal jugular vein is occluded at the inlet. Micropuncture kit was utilized to access the right external jugular vein under direct, real-time ultrasound guidance after the overlying soft tissues were anesthetized with 1% lidocaine with epinephrine. Stab incision was made with 11 blade scalpel. Microwire was passed centrally. The microwire was then marked to measure appropriate internal catheter length. External tunneled length was estimated. A total tip to cuff length of 23 cm was selected. 035 guidewire was advanced to the level of the IVC. Skin and subcutaneous tissues of chest wall below the clavicle were generously infiltrated with 1% lidocaine for local anesthesia. A small stab incision was made with 11 blade scalpel. The selected hemodialysis catheter was tunneled in a retrograde fashion from the anterior chest wall to the venotomy incision. Serial dilation was performed and then a peel-away sheath was placed. The catheter was then placed through the peel-away sheath with tips ultimately positioned within the superior aspect of the right atrium. Final catheter positioning was confirmed and documented with a spot radiographic image. The catheter aspirates and flushes normally. The catheter was flushed with appropriate volume heparin dwells. The catheter exit site was secured with a 0-Prolene retention suture. Gel-Foam slurry was infused into the soft tissue tract. The venotomy incision was closed Derma bond and sterile  dressing. Dressings were applied at the chest wall. Patient tolerated the procedure well and remained hemodynamically stable throughout. No complications were encountered and no significant blood loss encountered. IMPRESSION: Status post image guided placement of right external jugular tunneled hemodialysis catheter. Catheter ready for use. Signed, Dulcy Fanny. Dellia Nims, RPVI [facility] Radiology Electronically Signed   By: Corrie Mckusick D.O.   On: 09/16/2019 11:57   IR US Guide Vasc Access Right  Result Date: 09/16/2019 INDICATION: 79 year old male with a history of renal failure referred for hemodialysis catheter placement EXAM: TUNNELED CENTRAL VENOUS HEMODIALYSIS CATHETER PLACEMENT WITH ULTRASOUND AND FLUOROSCOPIC GUIDANCE MEDICATIONS: 2 g Ancef. The antibiotic was given in an appropriate time interval prior to skin puncture. ANESTHESIA/SEDATION: Moderate (conscious) sedation was employed during this procedure. A total of Versed 0 mg and Fentanyl 50 mcg was administered intravenously. Moderate Sedation Time: 0 minutes. The patient's level of consciousness and vital signs were monitored continuously by radiology nursing throughout the procedure under my direct supervision. FLUOROSCOPY TIME:  Fluoroscopy Time: 0 minutes 18 seconds (3 mGy). COMPLICATIONS: None PROCEDURE: Informed written consent was obtained from the patient after a discussion of the risks, benefits, and alternatives to treatment. Questions regarding the procedure were encouraged and answered. The right neck and chest were prepped with chlorhexidine in a sterile fashion, and a sterile drape was applied covering the operative field. Maximum barrier sterile technique with sterile gowns and gloves were used for the procedure. A timeout was performed prior to the initiation of the  procedure. Ultrasound survey was performed. Right external jugular vein was patent. Right internal jugular vein is  occluded at the inlet. Micropuncture kit was utilized to access the right external jugular vein under direct, real-time ultrasound guidance after the overlying soft tissues were anesthetized with 1% lidocaine with epinephrine. Stab incision was made with 11 blade scalpel. Microwire was passed centrally. The microwire was then marked to measure appropriate internal catheter length. External tunneled length was estimated. A total tip to cuff length of 23 cm was selected. 035 guidewire was advanced to the level of the IVC. Skin and subcutaneous tissues of chest wall below the clavicle were generously infiltrated with 1% lidocaine for local anesthesia. A small stab incision was made with 11 blade scalpel. The selected hemodialysis catheter was tunneled in a retrograde fashion from the anterior chest wall to the venotomy incision. Serial dilation was performed and then a peel-away sheath was placed. The catheter was then placed through the peel-away sheath with tips ultimately positioned within the superior aspect of the right atrium. Final catheter positioning was confirmed and documented with a spot radiographic image. The catheter aspirates and flushes normally. The catheter was flushed with appropriate volume heparin dwells. The catheter exit site was secured with a 0-Prolene retention suture. Gel-Foam slurry was infused into the soft tissue tract. The venotomy incision was closed Derma bond and sterile dressing. Dressings were applied at the chest wall. Patient tolerated the procedure well and remained hemodynamically stable throughout. No complications were encountered and no significant blood loss encountered. IMPRESSION: Status post image guided placement of right external jugular tunneled hemodialysis catheter. Catheter ready for use. Signed, Dulcy Fanny. Dellia Nims, RPVI [facility] Radiology Electronically Signed   By: Corrie Mckusick D.O.   On: 09/16/2019 11:57     Labs: BMET Recent Labs  Lab 09/12/19 0434 09/13/19 0506 09/14/19 0416 09/15/19 0424 09/17/19 0716  NA 137 136 136 136 137  K 4.0 5.1 3.7 4.0 4.4  CL 100 103 100 96* 97*  CO2 27 22 28 27 28   GLUCOSE 92 83 94 95 95  BUN 20 28* 10 19 22   CREATININE 4.50* 5.86* 3.18* 4.61* 4.50*  CALCIUM 7.9* 7.6* 7.4* 7.7* 7.7*  PHOS 3.4 4.0  --  3.5 4.0   CBC Recent Labs  Lab 09/12/19 0434 09/12/19 0434 09/13/19 0506 09/13/19 0506 09/14/19 0416 09/15/19 0424 09/17/19 0015 09/17/19 0525  WBC 22.5*   < > 24.9*   < > 20.6* 22.9* 24.5* 23.3*  NEUTROABS 17.9*  --  19.2*  --   --  17.3*  --   --   HGB 7.9*   < > 8.8*   < > 7.8* 8.0* 10.0* 8.6*  HCT 24.9*   < > 27.8*   < > 24.8* 25.4* 31.3* 26.8*  MCV 64.8*   < > 65.9*   < > 65.6* 66.3* 64.9* 65.5*  PLT 302   < > PLATELET CLUMPS NOTED ON SMEAR, UNABLE TO ESTIMATE   < > 309 349 386 381   < > = values in this interval not displayed.    Medications:    . amiodarone  200 mg Oral Daily  . vitamin C  500 mg Oral Q24H  . atorvastatin  40 mg Oral q1800  . B-complex with vitamin C  1 tablet Oral Daily  . Chlorhexidine Gluconate Cloth  6 each Topical Q0600  . cholecalciferol  1,000 Units Oral Daily  . cinacalcet  60 mg Oral QHS  .  clopidogrel  75 mg Oral Daily  . darbepoetin (ARANESP) injection - DIALYSIS  100 mcg Intravenous Q Mon-HD  . docusate sodium  100 mg Oral BID  . feeding supplement (PRO-STAT SUGAR FREE 64)  30 mL Oral TID  . melatonin  3 mg Oral QHS  . midodrine  10 mg Oral TID WC  . pantoprazole sodium  40 mg Oral BID  . sodium chloride flush  10-40 mL Intracatheter Q12H  . sodium chloride flush  3 mL Intravenous Q12H  . sodium chloride flush  3 mL Intravenous Q12H      Otelia Santee, MD 09/18/2019, 7:54 AM

## 2019-09-18 NOTE — Progress Notes (Signed)
PROGRESS NOTE    Shlomo Seres  LFY:[id number] DOB: 1941/02/22 DOA: 09/03/2019 PCP: [name], MD    No chief complaint on file.   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 However in the preoperative area, patient noted to have rectal bleeding for which [facility] was consulted and advised that the procedure will need to be postponed.  Patient was subsequently admitted.  Subjective: Patient denies any pain, fever or chills, he was able to take 25% of his breakfast this morning, he was encouraged to drink his Nepro .   Assessment & Plan:   Principal Problem:   GI bleeding Active Problems:   Encounter for central line placement   ESRD on dialysis [facility])   Aortic stenosis   PVD (peripheral vascular disease) ([place])   Essential hypertension   Duodenal ulcer   Palliative care encounter   DNR (do not resuscitate) discussion   Goals of care, counseling/discussion   Dehiscence of amputation stump ([place])   Chronic hypotension   SVT (supraventricular tachycardia) ([facility])   Ventricular ectopy  Upper GI bleed secondary to duodenal ulcer/acute blood loss  anemia Patient noted to have some rectal bleeding in the preop area prior to surgery and subsequently admitted.  Patient transfused a unit of packed red blood cells 09/04/2019.  Plavix currently on hold.  Status post upper endoscopy 09/04/2019 that showed duodenal ulcer which was likely cause of patient's bleeding per GI.  Colonoscopy subsequently was not performed as patient also had a poor prep. -Continue to monitor hemoglobin and transfuse as needed, so far hemoglobin remained stable H&H stable at 7.8.  Continue PPI every 12 hours.  Continue to hold Overall hemoglobin remained stable, will continue to monitor closely. -Resumed back on Plavix, as it was cleared per my discussion with GI .  End-stage renal disease on hemodialysis - Patient noted per nephrology that dressing from wound VAC currently over patient's AV graft. (And currently wound dressing). -IR has been consulted for tunnel dialysis dialysis catheter placement(which has postponed initially given markedly elevated white blood cell, but patient appears nontoxic, with chronic leukocytosis, with no evidence of acute infection, he had his tunneled catheter placed 5/20. -Hemodialysis management per renal.  Progressive gangrenous necrotic changes with stump dehiscence of left transtibial amputation site - Patient seen by orthopedics.  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  Patient initially with wound VAC, has been seen by orthopedic where it has been discontinued . currently on Prevena boot.  Outpatient follow-up with orthopedics.  Per orthopedics. -Hold blood at AKA site, so we will hold Plavix for 48 hours.  Leukocytosis Likely secondary to problem #3.  Leukocytosis was fluctuating but slowly started to trend down. Status post left AKA.  Patient started on IV Zyvox which is subsequently been discontinued..  Repeat blood cultures with no growth to date.  Due to worsening leukocytosis ID consulted who  recommended  discontinuation of antibiotics, MRI of the right foot, and consideration for CT chest abdomen and pelvis if worsening WBC and no cause found.  ID have signed off.   -He still having some significant leukocytosis but  he is nontoxic-appearing, this appears to be somehow chronic over now for 2 months  Chronic diastolic heart failure Compensated.  On hemodialysis.  Per nephrology.  Chronic hypotension Midodrine.    SVT Patient noted to have a run of SVT with heart rates as high as the 200s in the PACU postoperatively on 09/08/2019.  Patient received a dose of IV metoprolol and SVT broken and heart rate in the 70s.  Patient started on amiodarone and dose increased to 200 mg daily per cardiology for ventricular ectopy.  Outpatient follow-up with cardiology.    Ventricular ectopy Patient started on amiodarone per cardiology.  Amiodarone dose increased to 200 mg daily per cardiology.  Outpatient follow-up with cardiology.  Follow.  Severe Aortic stenosis Noted on TEE during previous admission.  Patient noted to not be a candidate for TAVR.  Avoid hypotension.  Cardiology was following but have signed off.    Prolonged QTC Repeat EKG with QTC of 509.  Keep magnesium > 2.  Keep potassium greater > 4.  Iron deficiency anemia/anemia of chronic disease -Continue to monitor closely and transfuse as needed   History of CVA Plavix currently on hold.  Could potentially likely resume Plavix after permanent catheter has been placed before discharge.    Generalized deconditioning Patient overall with a poor prognosis.  Palliative care has assessed and followed patient.  Patient now with partial code.  Patient wishes to have CPR and ACLS medication but no intubation or shock.  PT/OT.  Needs SNF.     DVT prophylaxis: Recent GI bleed.  Avoiding chemical prophylaxis.  SCDs. Code Status: Partial Family Communication: Updated patient, no family at bedside.  Disposition:   Status is:  Inpatient    Dispo: The patient is from: SNF              Anticipated d/c is to: SNF              Anticipated d/c date is: 2 days -Patient is currently cleared for discharge, but there is no SNF bed availability till Monday.     Consultants:   Gastroenterology: Dr. Tarri Glenn 09/03/2019  Nephrology: Dr.Schertz 09/03/2019  Palliative care: Lindell Spar, NP 09/04/2019  Cardiology: Dr. Lovena Le 09/04/2019  Domenic Moras, FNP 09/07/2019  Orthopedics: Dr. Sharol Given  ID: Dr. Tommy Medal 09/10/2019  Procedures:   Chest x-ray 09/03/2019  Upper endoscopy 09/04/2019--per Dr. Tarri Glenn  Left above-the-knee amputation per Dr. Sharol Given 09/08/2019 EGD on 09/04/2019 Impression: - Duodenal ulcer may be the cause of recent overt  GI bleeding. 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 Biopsied. - Erosive gastropathy with no bleeding and no  stigmata of recent bleeding. Biopsied. - A single non-bleeding angioectasia in the  duodenum. - Small hiatal hernia. - Unable to proceed with colonoscopy as planned due  to incomplete preparation. Patient had formed stool  on last bowel movement. Recommendation: - Return patient to hospital ward for ongoing care. - Clear liquid diet. Advance as tolerated. - No aspirin, ibuprofen, naproxen, or other  non-steroidal anti-inflammatory drugs. - Patient has a prolonged QT. Discussed with Dr.  Starla Link who agreed with trial of pantoprazole 40 mg  IV BID with close monitoring of QTc. - Will defer colonoscopy at this time given the   challenges that the patient has prepping for the  procedure. Duodenal ulcer may be the source of  bleeding. - Continue serial hgb/hct with transfusion as  indicated.  Exchange of left IJ central line for triple-lumen HD catheter--per IR Dr. Laurence Ferrari 09/10/2019  MRI right foot 09/10/2019  Antimicrobials:   IV Ancef 09/08/2019 preoperatively  IV Zyvox 09/08/2019>>>>> 09/10/2019    Objective: Vitals:   09/17/19 1442 09/17/19 2000 09/17/19 2200 09/18/19 0400  BP: (!) 96/54  (!) 94/56 (!) 102/56  Pulse: 91  89 95  Resp: _0 Temp: 99.2 F (37.3 C) 98.9 F (37.2 C)  98.6 F (37 C)  TempSrc: Axillary   Oral  SpO2: 93%  98% 97%  Weight:      Height:        Intake/Output Summary (Last 24 hours) at 09/18/2019 1129 Last data filed at 09/18/2019 0900 Gross per 24 hour  Intake 240 ml  Output 1 ml  Net 239 ml   Filed Weights   09/16/19 0519 09/17/19 0509 09/17/19 0653  Weight: 54.4 kg 53.3 kg 55.2 kg    Examination:  Awake Alert, Oriented X 3, No new F.N deficits, Normal affect Symmetrical Chest wall movement, Good air movement bilaterally, CTAB RRR,No Gallops,Rubs or new Murmurs, No Parasternal Heave +ve B.Sounds, Abd Soft, No tenderness, No rebound - guarding or rigidity. No Cyanosis, status post left AKA, with small amount of blood can be seen through gauze, right foot with transmetatarsal amputation.     Data Reviewed: I have personally reviewed following labs and imaging studies  CBC: Recent Labs  Lab 09/12/19 0434 09/12/19 0434 09/13/19 0506 09/14/19 0416 09/15/19 0424 09/17/19 0015 09/17/19 0525  WBC 22.5*   < > 24.9* 20.6* 22.9* 24.5* 23.3*  NEUTROABS 17.9*  --  19.2*  --  17.3*  --   --   HGB 7.9*   < > 8.8* 7.8* 8.0* 10.0* 8.6*  HCT 24.9*   < > 27.8* 24.8* 25.4* 31.3* 26.8*  MCV 64.8*   < > 65.9* 65.6* 66.3* 64.9*  65.5*  PLT 302   < > PLATELET CLUMPS NOTED ON SMEAR, UNABLE TO ESTIMATE 309 349 386 381   < > = values in this interval not displayed.    Basic Metabolic Panel: Recent Labs  Lab 09/12/19 0434 09/13/19 0506 09/14/19 0416 09/15/19 0424 09/17/19 0716  NA 137 136 136 136 137  K 4.0 5.1 3.7 4.0 4.4  CL 100 103 100 96* 97*  CO2 _1 GLUCOSE 92 83 94 95 95  BUN 20 28* _2 CREATININE 4.50* 5.86* 3.18* 4.61* 4.50*  CALCIUM 7.9* 7.6* 7.4* 7.7* 7.7*  PHOS 3.4 4.0  --  3.5 4.0    GFR: Estimated Creatinine Clearance: 10.9 mL/min (A) (by C-G formula based on SCr of 4.5 mg/dL (H)).  Liver Function Tests: Recent Labs  Lab 09/12/19 0434 09/13/19 0506 09/15/19 0424 09/17/19 0716  ALBUMIN 1.5* 1.5* 1.7* 1.7*    CBG: Recent Labs  Lab 09/15/19 0625 09/15/19 0641  GLUCAP 96 120*     Recent Results (from the past 240 hour(s))  Culture, blood (Routine X 2) w Reflex to ID Panel     Status: None   Collection Time: 09/10/19  9:20 AM   Specimen: BLOOD RIGHT HAND  Result Value Ref Range Status   Specimen Description BLOOD RIGHT HAND  Final   Special Requests   Final    BOTTLES DRAWN AEROBIC ONLY Blood Culture results may not be optimal due to  an inadequate volume of blood received in culture bottles   Culture   Final    NO GROWTH 5 DAYS Performed at [facility], [place] [address], [place], [place] [address]    Report Status 09/15/2019 FINAL  Final  Culture, blood (Routine X 2) w Reflex to ID Panel     Status: None   Collection Time: 09/10/19 10:15 AM   Specimen: BLOOD RIGHT ARM  Result Value Ref Range Status   Specimen Description BLOOD RIGHT ARM  Final   Special Requests   Final    BOTTLES DRAWN AEROBIC ONLY Blood Culture adequate volume   Culture   Final    NO GROWTH 5 DAYS Performed at [facility], [place] [address] [place], [place] [address]    Report Status 09/15/2019 FINAL  Final  Culture, blood (routine x 2)     Status: None (Preliminary  result)   Collection Time: 09/17/19 12:15 AM   Specimen: BLOOD  Result Value Ref Range Status   Specimen Description BLOOD RIGHT ARM  Final   Special Requests   Final    BOTTLES DRAWN AEROBIC ONLY Blood Culture results may not be optimal due to an inadequate volume of blood received in culture bottles   Culture   Final    NO GROWTH 1 DAY Performed at [facility], [place] [address], [place], [place] [address]    Report Status PENDING  Incomplete  Culture, blood (routine x 2)     Status: None (Preliminary result)   Collection Time: 09/17/19 12:15 AM   Specimen: BLOOD  Result Value Ref Range Status   Specimen Description BLOOD RIGHT HAND  Final   Special Requests   Final    BOTTLES DRAWN AEROBIC ONLY Blood Culture results may not be optimal due to an inadequate volume of blood received in culture bottles   Culture   Final    NO GROWTH 1 DAY Performed at [facility], [place] [address] [place], [place] [address]    Report Status PENDING  Incomplete         Radiology Studies: IR Fluoro Guide CV Line Right  Result Date: 09/16/2019 INDICATION: 79 year old male with a history of renal failure referred for hemodialysis catheter placement EXAM: TUNNELED CENTRAL VENOUS HEMODIALYSIS CATHETER PLACEMENT WITH ULTRASOUND AND FLUOROSCOPIC GUIDANCE MEDICATIONS: 2 g Ancef. The antibiotic was given in an appropriate time interval prior to skin puncture. ANESTHESIA/SEDATION: Moderate (conscious) sedation was employed during this procedure. A total of Versed 0 mg and Fentanyl 50 mcg was administered intravenously. Moderate Sedation Time: 0 minutes. The patient's level of consciousness and vital signs were monitored continuously by radiology nursing throughout the procedure under my direct supervision. FLUOROSCOPY TIME:  Fluoroscopy Time: 0 minutes 18 seconds (3 mGy). COMPLICATIONS: None PROCEDURE: Informed written consent was obtained from the patient after a discussion of the risks, benefits, and  alternatives to treatment. Questions regarding the procedure were encouraged and answered. The right neck and chest were prepped with chlorhexidine in a sterile fashion, and a sterile drape was applied covering the operative field. Maximum barrier sterile technique with sterile gowns and gloves were used for the procedure. A timeout was performed prior to the initiation of the procedure. Ultrasound survey was performed. Right external jugular vein was patent. Right internal jugular vein is occluded at the inlet. Micropuncture kit was utilized to access the right external jugular vein under direct, real-time ultrasound guidance after the overlying soft tissues were anesthetized with 1%  lidocaine with epinephrine. Stab incision was made with 11 blade scalpel. Microwire was passed centrally. The microwire was then marked to measure appropriate internal catheter length. External tunneled length was estimated. A total tip to cuff length of 23 cm was selected. 035 guidewire was advanced to the level of the IVC. Skin and subcutaneous tissues of chest wall below the clavicle were generously infiltrated with 1% lidocaine for local anesthesia. A small stab incision was made with 11 blade scalpel. The selected hemodialysis catheter was tunneled in a retrograde fashion from the anterior chest wall to the venotomy incision. Serial dilation was performed and then a peel-away sheath was placed. The catheter was then placed through the peel-away sheath with tips ultimately positioned within the superior aspect of the right atrium. Final catheter positioning was confirmed and documented with a spot radiographic image. The catheter aspirates and flushes normally. The catheter was flushed with appropriate volume heparin dwells. The catheter exit site was secured with a 0-Prolene retention suture. Gel-Foam slurry was infused into the soft tissue tract. The venotomy incision was closed Derma bond and sterile dressing. Dressings were  applied at the chest wall. Patient tolerated the procedure well and remained hemodynamically stable throughout. No complications were encountered and no significant blood loss encountered. IMPRESSION: Status post image guided placement of right external jugular tunneled hemodialysis catheter. Catheter ready for use. Signed, Dulcy Fanny. Dellia Nims, RPVI [facility] Radiology Electronically Signed   By: Corrie Mckusick D.O.   On: 09/16/2019 11:57   IR US Guide Vasc Access Right  Result Date: 09/16/2019 INDICATION: 79 year old male with a history of renal failure referred for hemodialysis catheter placement EXAM: TUNNELED CENTRAL VENOUS HEMODIALYSIS CATHETER PLACEMENT WITH ULTRASOUND AND FLUOROSCOPIC GUIDANCE MEDICATIONS: 2 g Ancef. The antibiotic was given in an appropriate time interval prior to skin puncture. ANESTHESIA/SEDATION: Moderate (conscious) sedation was employed during this procedure. A total of Versed 0 mg and Fentanyl 50 mcg was administered intravenously. Moderate Sedation Time: 0 minutes. The patient's level of consciousness and vital signs were monitored continuously by radiology nursing throughout the procedure under my direct supervision. FLUOROSCOPY TIME:  Fluoroscopy Time: 0 minutes 18 seconds (3 mGy). COMPLICATIONS: None PROCEDURE: Informed written consent was obtained from the patient after a discussion of the risks, benefits, and alternatives to treatment. Questions regarding the procedure were encouraged and answered. The right neck and chest were prepped with chlorhexidine in a sterile fashion, and a sterile drape was applied covering the operative field. Maximum barrier sterile technique with sterile gowns and gloves were used for the procedure. A timeout was performed prior to the initiation of the procedure. Ultrasound survey was performed. Right external jugular vein was patent. Right internal jugular vein is occluded at the inlet.  Micropuncture kit was utilized to access the right external jugular vein under direct, real-time ultrasound guidance after the overlying soft tissues were anesthetized with 1% lidocaine with epinephrine. Stab incision was made with 11 blade scalpel. Microwire was passed centrally. The microwire was then marked to measure appropriate internal catheter length. External tunneled length was estimated. A total tip to cuff length of 23 cm was selected. 035 guidewire was advanced to the level of the IVC. Skin and subcutaneous tissues of chest wall below the clavicle were generously infiltrated with 1% lidocaine for local anesthesia. A small stab incision was made with 11 blade scalpel. The selected hemodialysis catheter was tunneled in a retrograde fashion from the anterior chest wall to the venotomy incision. Serial dilation  was performed and then a peel-away sheath was placed. The catheter was then placed through the peel-away sheath with tips ultimately positioned within the superior aspect of the right atrium. Final catheter positioning was confirmed and documented with a spot radiographic image. The catheter aspirates and flushes normally. The catheter was flushed with appropriate volume heparin dwells. The catheter exit site was secured with a 0-Prolene retention suture. Gel-Foam slurry was infused into the soft tissue tract. The venotomy incision was closed Derma bond and sterile dressing. Dressings were applied at the chest wall. Patient tolerated the procedure well and remained hemodynamically stable throughout. No complications were encountered and no significant blood loss encountered. IMPRESSION: Status post image guided placement of right external jugular tunneled hemodialysis catheter. Catheter ready for use. Signed, Dulcy Fanny. Dellia Nims, RPVI [facility] Radiology Electronically Signed   By: Corrie Mckusick D.O.   On: 09/16/2019 11:57        Scheduled  Meds: . amiodarone  200 mg Oral Daily  . vitamin C  500 mg Oral Q24H  . atorvastatin  40 mg Oral q1800  . B-complex with vitamin C  1 tablet Oral Daily  . Chlorhexidine Gluconate Cloth  6 each Topical Q0600  . cholecalciferol  1,000 Units Oral Daily  . cinacalcet  60 mg Oral QHS  . darbepoetin (ARANESP) injection - DIALYSIS  100 mcg Intravenous Q Mon-HD  . docusate sodium  100 mg Oral BID  . feeding supplement (PRO-STAT SUGAR FREE 64)  30 mL Oral TID  . melatonin  3 mg Oral QHS  . midodrine  10 mg Oral TID WC  . pantoprazole sodium  40 mg Oral BID  . sodium chloride flush  10-40 mL Intracatheter Q12H  . sodium chloride flush  3 mL Intravenous Q12H  . sodium chloride flush  3 mL Intravenous Q12H   Continuous Infusions: . sodium chloride Stopped (09/04/19 1236)     LOS: 15 days     Phillips Climes, MD [facility]   09/18/2019, 11:29 AM

## 2019-09-18 NOTE — Discharge Summary (Signed)
Joseph Whitner., is a 79 y.o. male  DOB [birth year]  MRN [id number].  Admission date:  09/03/2019  Admitting Physician  Modena Jansky, MD  Discharge Date:  09/18/2019   Primary MD  [name], MD  Recommendations for primary care physician for things to follow:  -To continue with hemodialysis on Monday Wednesday Friday schedule as previously done before admission. -Patient to follow with orthopedic surgery in 10 to 14 days regarding wound check and staples evaluation. -Patient to resume Plavix in 2 days -Patient is on dysphagia two diet given poor dentition.  As well please keep encouraging him about oral intake, and continue to offer supplements.   Admission Diagnosis  Acute GI bleeding [K92.2]   Discharge Diagnosis  Acute GI bleeding [K92.2]   Principal Problem:   GI bleeding Active Problems:   Encounter for central line placement   ESRD on dialysis [facility])   Aortic stenosis   PVD (peripheral vascular disease) ([place])   Essential hypertension   Duodenal ulcer   Palliative care encounter   DNR (do not resuscitate) discussion   Goals of care, counseling/discussion   Dehiscence of amputation stump ([place])   Chronic hypotension   SVT (supraventricular tachycardia) ([place])   Ventricular ectopy      Past Medical History:  Diagnosis Date  . Acute osteomyelitis of left foot ([place]) 07/21/2019  . Anxiety   . Aortic stenosis   . Arthritis   . Closed avulsion fracture of right hip ([place]) 06/30/2019  . ESRD (end stage renal disease) on dialysis [facility])    M/W/F at [facility] in [employer]  . Essential hypertension   . Gangrene of left foot ([place]) 06/04/2018  . Gangrene of right foot ([place])   . Gastric ulcer 2004  . GI bleed   . GI bleeding 08/2019  . History of blood transfusion   . History of cardiomyopathy    LVEF normal as of February 2017  . History of gastric ulcer   . History of stroke    Left side  weakness  . Iron deficiency anemia   . Osteomyelitis ([place])   . Peripheral vascular disease [facility])     Past Surgical History:  Procedure Laterality Date  . ABDOMINAL AORTAGRAM N/A 01/24/2012   Procedure: ABDOMINAL Maxcine Ham;  Surgeon: Elam Dutch, MD;  Location: [facility] CATH LAB;  Service: Cardiovascular;  Laterality: N/A;  . ABDOMINAL AORTOGRAM W/LOWER EXTREMITY N/A 04/15/2017   Procedure: ABDOMINAL AORTOGRAM W/LOWER EXTREMITY;  Surgeon: Angelia Mould, MD;  Location: [place] CV LAB;  Service: Cardiovascular;  Laterality: N/A;  . AMPUTATION Right 05/09/2017   Procedure: RIGHT TRANSMETATARSAL AMPUTATION;  Surgeon: [name], MD;  Location: [place];  Service: Orthopedics;  Laterality: Right;  . AMPUTATION Right 06/13/2018   Procedure: AMPUTATION RIGHT LONG FINGER;  Surgeon: Dayna Barker, MD;  Location: [place];  Service: Plastics;  Laterality: Right;  . AMPUTATION Left 07/21/2019   Procedure: LEFT TRANSMETATARSAL AMPUTATION;  Surgeon: [name], MD;  Location: [place];  Service: Orthopedics;  Laterality: Left;  . AMPUTATION Left 08/13/2019   Procedure: AMPUTATION BELOW KNEE;  Surgeon: [name], MD;  Location: [place];  Service: Orthopedics;  Laterality: Left;  . AMPUTATION Left 09/08/2019   Procedure: LEFT ABOVE KNEE AMPUTATION;  Surgeon: [name], MD;  Location: [place];  Service: Orthopedics;  Laterality: Left;  . ARTERIOVENOUS GRAFT PLACEMENT Right right arm  . AV FISTULA PLACEMENT Left 08/31/2015   Procedure: ARTERIOVENOUS (AV) FISTULA CREATION- LEFT RADIOCEPHALIC;  Surgeon: Mal Misty, MD;  Location: [place];  Service: Vascular;  Laterality: Left;  . AV FISTULA PLACEMENT Right 02/18/2017   Procedure: INSERTION OF ARTERIOVENOUS (AV) GORE-TEX GRAFT  RIGHT UPPER ARM;  Surgeon: Conrad Oriska, MD;  Location: [place];  Service: Vascular;  Laterality: Right;  . AV FISTULA PLACEMENT Left 12/01/2018   Procedure: INSERTION OF ARTERIOVENOUS (AV) GORE-TEX GRAFT ARM;  Surgeon:  Angelia Mould, MD;  Location: [place];  Service: Vascular;  Laterality: Left;  . AV FISTULA PLACEMENT Left 02/09/2019   Procedure: INSERTION OF ARTERIOVENOUS (AV) GORE-TEX GRAFT LEFT THIGH;  Surgeon: Waynetta Sandy, MD;  Location: [place];  Service: Vascular;  Laterality: Left;  . BASCILIC VEIN TRANSPOSITION Left 12/19/2015   Procedure: FIRST STAGE BRACHIAL VEIN TRANSPOSITION;  Surgeon: Conrad Tira, MD;  Location: [place];  Service: Vascular;  Laterality: Left;  . BASCILIC VEIN TRANSPOSITION Left 02/08/2016   Procedure: SECOND STAGE BRACHIAL VEIN TRANSPOSITION;  Surgeon: Conrad [place], MD;  Location: [place];  Service: Vascular;  Laterality: Left;  . BIOPSY  09/04/2019   Procedure: BIOPSY;  Surgeon: Thornton Park, MD;  Location: [place];  Service: Gastroenterology;;  . Kathleen Argue STUDY  08/16/2019   Procedure: BUBBLE STUDY;  Surgeon: Elouise Munroe, MD;  Location: [place];  Service: Cardiovascular;;  . CARDIAC CATHETERIZATION N/A 07/11/2015   Procedure: Left Heart Cath and Coronary Angiography;  Surgeon: Troy Sine, MD;  Location: [place] CV LAB;  Service: Cardiovascular;  Laterality: N/A;  . Carpel Tunnel Left Dec. 22, 2016  . CENTRAL LINE INSERTION N/A 09/03/2019   Procedure: CENTRAL LINE INSERTION;  Surgeon: Juanito Doom, MD;  Location: [place];  Service: Cardiopulmonary;  Laterality: N/A;  . CHOLECYSTECTOMY    . COLONOSCOPY  2004   Dr. Irving Shows, left sided diverticula and cecal polyp, path unknown  . COLONOSCOPY  10/29/2011   Procedure: COLONOSCOPY;  Surgeon: Daneil Dolin, MD;  [facility];  Service: Endoscopy;  Laterality: N/A;  10:15  . ESOPHAGOGASTRODUODENOSCOPY  11/2002   Dr. Gala Romney, erosive reflux esophagitis, multiple gastric ulcer and antral/bulbar erosions. Serologies positive for H.Pylori and was treated  . ESOPHAGOGASTRODUODENOSCOPY  11/20014   Dr. Gala Romney, small hh only, ulcers healed  . ESOPHAGOGASTRODUODENOSCOPY  09/21/2011    Dr Trevor Iha [facility], antral erosions, ?early GAVE  . ESOPHAGOGASTRODUODENOSCOPY (EGD) WITH PROPOFOL N/A 09/04/2019   Procedure: ESOPHAGOGASTRODUODENOSCOPY (EGD) WITH PROPOFOL;  Surgeon: Thornton Park, MD;  Location: [place];  Service: Gastroenterology;  Laterality: N/A;  . FISTULOGRAM Left 12/10/2016   Procedure: THROMBECTOMY OF LEFT ARM ARTERIOVENOUS FISTULA;  Surgeon: Waynetta Sandy, MD;  Location: [place];  Service: Vascular;  Laterality: Left;  . INSERTION OF DIALYSIS CATHETER Left 12/10/2016   Procedure: INSERTION OF TUNNELED DIALYSIS CATHETER;  Surgeon: Waynetta Sandy, MD;  Location: [place];  Service: Vascular;  Laterality: Left;  . INSERTION OF DIALYSIS CATHETER Right 06/09/2018   Procedure: INSERTION OF DIALYSIS CATHETER Right subclavian;  Surgeon: Angelia Mould, MD;  [facility] OR;  Service: Vascular;  Laterality: Right;  . IR DIALY SHUNT INTRO NEEDLE/INTRACATH INITIAL W/IMG RIGHT Right 01/01/2018  . IR FLUORO GUIDE CV LINE LEFT  09/10/2019  . IR FLUORO GUIDE CV LINE RIGHT  09/16/2019  . IR GENERIC HISTORICAL  07/16/2016   IR REMOVAL TUN CV CATH W/O FL 07/16/2016 Saverio Danker, PA-C [facility]-INTERV RAD  . IR PTA ADDL CENTRAL DIALYSIS SEG THRU DIALY CIRCUIT RIGHT Right 10/21/2017  . IR REMOVAL TUN CV CATH W/O FL  05/12/2017  . IR THROMBECTOMY AV FISTULA W/THROMBOLYSIS/PTA INC/SHUNT/IMG RIGHT Right 10/21/2017  . IR THROMBECTOMY AV FISTULA W/THROMBOLYSIS/PTA INC/SHUNT/IMG RIGHT Right 11/27/2017  . IR US GUIDE VASC ACCESS LEFT  09/10/2019  . IR US GUIDE VASC ACCESS RIGHT  10/21/2017  . IR US GUIDE VASC ACCESS RIGHT  11/27/2017  . IR US GUIDE VASC ACCESS RIGHT  01/01/2018  . IR US GUIDE VASC ACCESS RIGHT  09/16/2019  . LIGATION ARTERIOVENOUS GORTEX GRAFT Right 06/09/2018  . LIGATION ARTERIOVENOUS GORTEX GRAFT Right 06/09/2018   Procedure: LIGATION ARTERIOVENOUS GORTEX GRAFT RIGHT ARM;  Surgeon: Angelia Mould, MD;  Location: [place];  Service: Vascular;  Laterality: Right;    . LIGATION ARTERIOVENOUS GORTEX GRAFT Left 12/31/2018   Procedure: LIGATION OF LEFT UPPER ARM ARTERIVENOUS GORTEX GRAFT, LEFT BRACHIAL ARTERY ENDARTERECTOMY WITH BOVINE PATCH ANGIOPLASTY;  Surgeon: Waynetta Sandy, MD;  Location: [place];  Service: Vascular;  Laterality: Left;  . LIGATION OF ARTERIOVENOUS  FISTULA Left 12/19/2015   Procedure: LIGATION OF RADIOCEPHALIC ARTERIOVENOUS  FISTULA;  Surgeon: Conrad [place], MD;  Location: [place];  Service: Vascular;  Laterality: Left;  Marland Kitchen MASS EXCISION Right 02/18/2017   Procedure: EXCISION OF RIGHT AXILLARY EPIDERMAL INCLUSION CYST;  Surgeon: Conrad [place], MD;  Location: [place];  Service: Vascular;  Laterality: Right;  . PERIPHERAL VASCULAR CATHETERIZATION N/A 12/14/2015   Procedure: Fistulagram;  Surgeon: Conrad [place], MD;  Location: [place] CV LAB;  Service: Cardiovascular;  Laterality: N/A;  . SHOULDER SURGERY Right    fracture  . TEE WITHOUT CARDIOVERSION N/A 08/16/2019   Procedure: TRANSESOPHAGEAL ECHOCARDIOGRAM (TEE);  Surgeon: Elouise Munroe, MD;  Location: [place];  Service: Cardiovascular;  Laterality: N/A;  . ULTRASOUND GUIDANCE FOR VASCULAR ACCESS  04/15/2017   Procedure: Ultrasound Guidance For Vascular Access;  Surgeon: Angelia Mould, MD;  Location: [place] CV LAB;  Service: Cardiovascular;;  . UPPER EXTREMITY VENOGRAPHY Bilateral 12/17/2016   Procedure: Bilateral Upper Extremity Venography;  Surgeon: Serafina Mitchell, MD;  Location: [place] CV LAB;  Service: Cardiovascular;  Laterality: Bilateral;  . UPPER EXTREMITY VENOGRAPHY Left 11/03/2018   Procedure: UPPER EXTREMITY VENOGRAPHY;  Surgeon: Serafina Mitchell, MD;  Location: [place] CV LAB;  Service: Cardiovascular;  Laterality: Left;       History of present illness and  Hospital Course:     Kindly see H&P for history of present illness and admission details, please review complete Labs, Consult reports and Test reports for all details in brief  HPI   from the history and physical done on the day of admission 09/03/2019  HPI: Joseph Beyl. 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  However in the preoperative area, patient noted to have rectal bleeding for which [facility] was consulted and advised that the procedure will need to be postponed.  TRH was thereby consulted to admit patient for further evaluation and management of GI bleeding. Patient is a poor historian.  States that he may have had rectal bleeding for the last 5 days but unable to provide much details.  States that he had 1 day history of severe lower abdominal pain and some nonbloody emesis.  As per discussion with GI team and his nurses in the preop area, patient had a moderate rectal bleeding, bright red blood with some clots.  Remained hemodynamically stable.  They were unable to get any labs because he is a difficult stick and no IV lines either.  Currently patient denies chest pain, dyspnea, dizziness, lightheadedness or pain elsewhere.  Hospital Course   Upper GI bleed secondary to duodenal ulcer/acute blood loss anemia Patient noted to have some rectal  bleeding in the preop area prior to surgery and subsequently admitted.  Patient transfused a unit of packed red blood cells 09/04/2019.  Plavix has been held on admission.  Status post upper endoscopy 09/04/2019 that showed duodenal ulcer which was likely cause of patient's bleeding per GI.  Colonoscopy subsequently was not performed as patient also had a poor prep. -Hemoglobin has been stable, he was started on Protonix twice daily during hospital stay, he is to continue Protonix 40 mg oral daily . -Patient was resumed on Plavix during hospital stay, he had some very minimal amount of oozing from his surgical site, but overall this is a stable, hemoglobin has remained stable as well, so recommendation to hold for another 24 hours as an outpatient and resume on 5/26.  End-stage renal disease on hemodialysis - Patient noted per nephrology that dressing from wound VAC currently over patient's AV graft. (And currently wound dressing). -IR has been consulted for tunnel dialysis dialysis catheter placement(which has postponed initially given markedly elevated white blood cell, but patient appears nontoxic, with chronic leukocytosis, with no evidence of acute infection, he had his tunneled catheter placed 5/20.  Progressive gangrenous necrotic changes with stump dehiscence of left transtibial amputation site - Patient seen by orthopedics.  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  Patient initially with wound VAC, has been discontinued by orthopedic, to follow with Ortho as an outpatient regarding wound check and staple removal in 10 to 14 days.  Leukocytosis Likely secondary to problem #3.  Leukocytosis was fluctuating but slowly started to trend down. Status post left AKA.  Patient started on IV Zyvox which is subsequently been discontinued..  Repeat blood cultures with no growth to date.  Due to worsening leukocytosis ID consulted who recommended discontinuation of  antibiotics, MRI of the right foot, and consideration for CT chest abdomen and pelvis if worsening WBC and no cause found.  ID have signed off.   -He still having some significant leukocytosis at 20 8K, but he is nontoxic-appearing, afebrile, and this appears to be somehow chronic for last couple months, but at this point no indication for antibiotics.  Chronic diastolic heart failure Compensated.  On hemodialysis.  Per nephrology.  Chronic  hypotension Midodrine.    SVT Patient noted to have a run of SVT with heart rates as high as the 200s in the PACU postoperatively on 09/08/2019.  Patient received a dose of IV metoprolol and SVT broken and heart rate in the 70s.  Patient started on amiodarone and dose increased to 200 mg daily per cardiology for ventricular ectopy.  Outpatient follow-up with cardiology.    Ventricular ectopy Patient started on amiodarone per cardiology.  Amiodarone dose increased to 200 mg daily per cardiology.  Outpatient follow-up with cardiology.  Follow.  Severe Aortic stenosis Noted on TEE during previous admission.  Patient noted to not be a candidate for TAVR.  Avoid hypotension.  Cardiology was following but have signed off.    Prolonged QTC Repeat EKG with QTC of 509.  Keep magnesium > 2.  Keep potassium greater > 4.  Iron deficiency anemia/anemia of chronic disease -Continue to monitor closely and transfuse as needed   History of CVA Resume on Plavix  Generalized deconditioning Patient overall with a poor prognosis.  Palliative care has assessed and followed patient.  He has been transition to peritoneal code.  Patient wishes to have CPR and ACLS medication but no intubation or shock.     Discharge Condition:  Stable   Follow UP   Contact information for follow-up providers    Persons, Bevely Palmer, PA In 1 week.   Specialty: Orthopedic Surgery Contact information: [place] [address] [phone]        Erma Heritage, PA-C Follow up.   Specialties: Physician Assistant, Cardiology Why: An appointment has been scheduled for you at [facility] on Tuesday Oct 26, 2019 at 3:00 PM (Arrive by 2:45 PM). This is with one of our PAs, [place], who works closely with Dr. Domenic Polite.  Contact information: [place] [address] [phone]        [name], MD Follow up.   Specialty: Orthopedic Surgery Contact information: [place] [address] [phone]            Contact information for after-discharge care    [facility] .   Service: Skilled Nursing Contact information: [address] [place] [phone]                    Discharge Instructions  and  Discharge Medications     Discharge Instructions    Discharge instructions   Complete by: As directed    Follow with Primary MD [name], MD or SNF physician.  Get CBC, CMP,  checked  by Primary MD next visit.    Disposition SNF   Diet: Renal diet 1200 cc fluid restriction, dysphagia 2 with thin liquid  On your next visit with your primary care physician please Get Medicines reviewed and adjusted.   Please request your Prim.MD to go over all Hospital Tests and Procedure/Radiological results at the follow up, please get all Hospital records sent to your Prim MD by signing hospital release before you go home.   If you experience worsening of your admission symptoms, develop shortness of breath, life threatening emergency, suicidal or homicidal thoughts you must seek medical attention immediately by calling 911 or calling your MD immediately  if symptoms less severe.  You Must read complete instructions/literature along with all the possible adverse reactions/side effects for all the Medicines you take and that have been prescribed  to you. Take any new Medicines after you have completely understood  and accpet all the possible adverse reactions/side effects.   Do not drive, operating heavy machinery, perform activities at heights, swimming or participation in water activities or provide baby sitting services if your were admitted for syncope or siezures until you have seen by Primary MD or a Neurologist and advised to do so again.  Do not drive when taking Pain medications.    Do not take more than prescribed Pain, Sleep and Anxiety Medications  Special Instructions: If you have smoked or chewed Tobacco  in the last 2 yrs please stop smoking, stop any regular Alcohol  and or any Recreational drug use.  Wear Seat belts while driving.   Please note  You were cared for by a hospitalist during your hospital stay. If you have any questions about your discharge medications or the care you received while you were in the hospital after you are discharged, you can call the unit and asked to speak with the hospitalist on call if the hospitalist that took care of you is not available. Once you are discharged, your primary care physician will handle any further medical issues. Please note that NO REFILLS for any discharge medications will be authorized once you are discharged, as it is imperative that you return to your primary care physician (or establish a relationship with a primary care physician if you do not have one) for your aftercare needs so that they can reassess your need for medications and monitor your lab values.   Discharge wound care:   Complete by: As directed    Change dressing daily 4x4's abd and tape.  May cleanse dressing with mild soap and water at left AKA site     Allergies as of 09/18/2019      Reactions   Aspirin Other (See Comments)   Causes internal bleeding  History of ulcers      Medication List    STOP taking these medications   doxercalciferol 2.5 MCG capsule Commonly known as: [name]   paricalcitol 2 MCG capsule Commonly known as: ZEMPLAR   vitamin C  500 MG tablet Commonly known as: ASCORBIC ACID     TAKE these medications   acetaminophen 325 MG tablet Commonly known as: TYLENOL Take 2 tablets (650 mg total) by mouth every 6 (six) hours as needed for mild pain (or Fever >/= 101).   amiodarone 200 MG tablet Commonly known as: PACERONE Take 1 tablet (200 mg total) by mouth daily. Start taking on: Sep 19, 2019   atorvastatin 40 MG tablet Commonly known as: LIPITOR Take 40 mg by mouth daily.   B-complex with vitamin C tablet Take 1 tablet by mouth daily.   cholecalciferol 25 MCG (1000 UNIT) tablet Commonly known as: VITAMIN D3 Take 1,000 Units by mouth daily.   cinacalcet 60 MG tablet Commonly known as: SENSIPAR Take 60 mg by mouth at bedtime.   clopidogrel 75 MG tablet Commonly known as: PLAVIX Take 1 tablet (75 mg total) by mouth daily. Start on 5/25 Start taking on: Sep 21, 2019 What changed:   additional instructions  These instructions start on Sep 21, 2019. If you are unsure what to do until then, ask your doctor or other care provider.   feeding supplement (NEPRO CARB STEADY) Liqd Take 237 mLs by mouth 2 (two) times daily between meals. What changed: when to take this   feeding supplement (PRO-STAT SUGAR FREE 64) Liqd Take 30 mLs by mouth  in the morning and at bedtime.   melatonin 3 MG Tabs tablet Take 3 mg by mouth at bedtime.   midodrine 10 MG tablet Commonly known as: PROAMATINE Take 1 tablet (10 mg total) by mouth 3 (three) times daily.   mirtazapine 7.5 MG tablet Commonly known as: REMERON Take 7.5 mg by mouth at bedtime.   oxyCODONE 5 MG immediate release tablet Commonly known as: Oxy IR/ROXICODONE Take 1 tablet (5 mg total) by mouth every 6 (six) hours as needed for severe pain (5 mg mild pain/ 10 mg moderate pain). What changed:   how much to take  when to take this  reasons to take this   pantoprazole 40 MG tablet Commonly known as: Protonix Take 1 tablet (40 mg total) by mouth  daily.   sennosides-docusate sodium 8.6-50 MG tablet Commonly known as: SENOKOT-S Take 1 tablet by mouth daily.            Discharge Care Instructions  (From admission, onward)         Start     Ordered   09/18/19 0000  Discharge wound care:    Comments: Change dressing daily 4x4's abd and tape.  May cleanse dressing with mild soap and water at left AKA site   09/18/19 1115            Diet and Activity recommendation: See Discharge Instructions above   Consults obtained -      Consultants:   Gastroenterology: Dr. Tarri Glenn 09/03/2019  Nephrology: Dr.Schertz 09/03/2019  Palliative care: Lindell Spar, NP 09/04/2019  Cardiology: Dr. Lovena Le 09/04/2019  Domenic Moras, FNP 09/07/2019  Orthopedics: Dr. Sharol Given  ID: Dr. Tommy Medal 09/10/2019  Procedures:   Chest x-ray 09/03/2019  Upper endoscopy 09/04/2019--per Dr. Tarri Glenn  Left above-the-knee amputation per Dr. Sharol Given 09/08/2019 EGD on 09/04/2019 Impression: - Duodenal ulcer may be the cause of recent overt  GI bleeding. 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 Biopsied. - Erosive gastropathy with no bleeding and no  stigmata of recent bleeding. Biopsied. - A single non-bleeding angioectasia in the  duodenum. - Small hiatal hernia. - Unable to proceed with colonoscopy as planned due  to incomplete preparation. Patient had formed stool  on last bowel movement. Recommendation: - Return patient to hospital ward for ongoing care. - Clear liquid diet. Advance as tolerated. - No aspirin, ibuprofen, naproxen, or other  non-steroidal anti-inflammatory  drugs. - Patient has a prolonged QT. Discussed with Dr.  Starla Link who agreed with trial of pantoprazole 40 mg  IV BID with close monitoring of QTc. - Will defer colonoscopy at this time given the  challenges that the patient has prepping for the  procedure. Duodenal ulcer may be the source of  bleeding. - Continue serial hgb/hct with transfusion as  indicated.  Exchange of left IJ central line for triple-lumen HD catheter--per IR Dr. Laurence Ferrari 09/10/2019  MRI right foot 09/10/2019  Right EJ tunneled catheter on 5/20 + LIJ temp out 5/22  Major procedures and Radiology Reports - PLEASE review detailed and final reports for all details, in brief -      MR FOOT RIGHT WO CONTRAST  Result Date: 09/10/2019 CLINICAL DATA:  Fever elevated WBC infection of the right forefoot EXAM: MRI OF THE RIGHT FOREFOOT WITHOUT CONTRAST TECHNIQUE: Multiplanar, multisequence MR imaging of the right was performed. No intravenous contrast was administered. COMPARISON:  None. 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 There is mildly increased T2 hyperintense signal seen within the  proximal third metatarsal shaft with a minimally increased T1 hypointense signal. There is also mildly increased marrow signal seen at the amputation site of the first metatarsal, however no T1 hypointensity. Ligaments The Lisfranc ligaments are intact. Muscles and Tendons Mild fatty atrophy noted within the muscles surrounding the forefoot. The visualized portions of the extensor and flexor tendons are intact. Soft tissues Mild skin irregularity with superficial ulceration and subcutaneous edema seen overlying the third metatarsal  amputation site. However no sinus tract or loculated fluid collection. IMPRESSION: Mild overlying skin irregularity and edema seen at the third and fourth metatarsal amputation sites with intramedullary signal changes in the proximal third metatarsal shaft which could be due to mild osteomyelitis or reactive marrow. No soft tissue abscess or sinus tract. Probable reactive marrow within the amputation site of the first metatarsal. Electronically Signed   By: Prudencio Pair M.D.   On: 09/10/2019 20:55   IR Fluoro Guide CV Line Left  Result Date: 09/10/2019 INDICATION: 79 year old male with end-stage renal disease on hemodialysis. 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 He requires a temporary catheter for hemodialysis. Ultrasound evaluation of the right internal jugular vein demonstrates an occluded IJ. On the left, a central venous catheter is currently present. The left internal jugular vein narrows around the catheter and may also be occluded. EXAM: 1. Ultrasound-guided puncture left internal jugular vein 2. Catheter exchange MEDICATIONS: None. ANESTHESIA/SEDATION: None FLUOROSCOPY TIME:  Fluoroscopy Time: 6 minutes 12 seconds (25 mGy). COMPLICATIONS: None immediate. PROCEDURE: Informed written consent was obtained from the patient after a thorough discussion of the procedural risks, benefits and alternatives. All questions were addressed. Maximal Sterile Barrier Technique was utilized including caps, mask, sterile gowns, sterile gloves, sterile drape, hand hygiene and skin antiseptic. A timeout was performed prior to the initiation of the procedure. The left internal jugular vein was interrogated with ultrasound and found to be abnormal. A central venous catheter is present within the vessel. More superiorly in the neck, the vessel remains patent although the wall is thickened. However, as ultrasound was used to track more distally along the vessel, the  vessel becomes narrowed surrounding the central venous catheter concerning for occlusion. As the patient has very poor access options, the decision was made to attempt puncture of the patent portion of the IJ to see of a wire would pass centrally. An image was obtained and stored for the medical record. Local anesthesia was attained by infiltration with 1% lidocaine. Under real-time sonographic guidance, the vessel was punctured with a 21 gauge micropuncture needle. Unfortunately, a wire could not be passed into the central veins. At this time, the decision was made to proceed with exchange of the existing central venous catheter for a temporary hemodialysis catheter with an additional third lumen for venous access. The retention sutures were cut of the existing triple-lumen catheter. The hub was cut off the catheter. A stiff glidewire was advanced through the central lumen. Fluoroscopically, the tip of the existing catheter was actually within the azygos vein. The catheter was pulled back in till the tip was in the upper SVC. The stiff Glidewire was then successfully advanced through the heart and into the inferior vena cava. The central venous catheter was removed over the wire. The skin tract was dilated and a 20 cm triple-lumen non tunneled hemodialysis catheter was advanced over the wire and position with the tip at the superior cavoatrial junction. The catheter flushes and aspirates easily.  The catheter was flushed with heparinized saline and secured to the skin with 0 Prolene suture. A sterile bandage was applied. IMPRESSION: 1. Occluded right internal jugular vein. 2. The left internal jugular vein is also occluded surrounding the existing central venous catheter. 3. A new catheter could not be placed, therefore the existing triple-lumen catheter was exchanged over a wire for a triple-lumen temporary hemodialysis catheter. Signed, Criselda Peaches, MD, [place] [facility] Radiology Electronically Signed   By: Jacqulynn Cadet M.D.   On: 09/10/2019 15:25   IR Fluoro Guide CV Line Right  Result Date: 09/16/2019 INDICATION: 79 year old male with a history of renal failure referred for hemodialysis catheter placement EXAM: TUNNELED CENTRAL VENOUS HEMODIALYSIS CATHETER PLACEMENT WITH ULTRASOUND AND FLUOROSCOPIC GUIDANCE MEDICATIONS: 2 g Ancef. The antibiotic was given in an appropriate time interval prior to skin puncture. ANESTHESIA/SEDATION: Moderate (conscious) sedation was employed during this procedure. A total of Versed 0 mg and Fentanyl 50 mcg was administered intravenously. Moderate Sedation Time: 0 minutes. The patient's level of consciousness and vital signs were monitored continuously by radiology nursing throughout the procedure under my direct supervision. FLUOROSCOPY TIME:  Fluoroscopy Time: 0 minutes 18 seconds (3 mGy). COMPLICATIONS: None PROCEDURE: Informed written consent was obtained from the patient after a discussion of the risks, benefits, and alternatives to treatment. Questions regarding the procedure were encouraged and answered. The right neck and chest were prepped with chlorhexidine in a sterile fashion, and a sterile drape was applied covering the operative field. Maximum barrier sterile technique with sterile gowns and gloves were used for the procedure. A timeout was performed prior to the initiation of the procedure. Ultrasound survey was performed. Right external jugular vein was patent. Right internal jugular vein is occluded at the inlet. Micropuncture kit was utilized to access the right external jugular vein under direct, real-time ultrasound guidance after the overlying soft tissues were anesthetized with 1% lidocaine with epinephrine. Stab incision was made with 11 blade scalpel. Microwire was passed centrally. The microwire was then marked to measure appropriate internal catheter length. External tunneled length was  estimated. A total tip to cuff length of 23 cm was selected. 035 guidewire was advanced to the level of the IVC. Skin and subcutaneous tissues of chest wall below the clavicle were generously infiltrated with 1% lidocaine for local anesthesia. A small stab incision was made with 11 blade scalpel. The selected hemodialysis catheter was tunneled in a retrograde fashion from the anterior chest wall to the venotomy incision. Serial dilation was performed and then a peel-away sheath was placed. The catheter was then placed through the peel-away sheath with tips ultimately positioned within the superior aspect of the right atrium. Final catheter positioning was confirmed and documented with a spot radiographic image. The catheter aspirates and flushes normally. The catheter was flushed with appropriate volume heparin dwells. The catheter exit site was secured with a 0-Prolene retention suture. Gel-Foam slurry was infused into the soft tissue tract. The venotomy incision was closed Derma bond and sterile dressing. Dressings were applied at the chest wall. Patient tolerated the procedure well and remained hemodynamically stable throughout. No complications were encountered and no significant blood loss encountered. IMPRESSION: Status post image guided placement of right external jugular tunneled hemodialysis catheter. Catheter ready for use. Signed, Dulcy Fanny. Dellia Nims, RPVI [facility] Inter[facility] Rad[facility] Specialists [facility] Radiology Electronically Signed   By: Corrie Mckusick D.O.   On: 09/16/2019 11:57   IR US Guide Vasc Access Left  Result Date: 09/10/2019 INDICATION: 79 year old male with end-stage renal disease on hemodialysis. 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 He requires a temporary catheter for hemodialysis. Ultrasound evaluation of the right internal jugular vein demonstrates an occluded IJ. On the left, a central venous catheter is currently  present. The left internal jugular vein narrows around the catheter and may also be occluded. EXAM: 1. Ultrasound-guided puncture left internal jugular vein 2. Catheter exchange MEDICATIONS: None. ANESTHESIA/SEDATION: None FLUOROSCOPY TIME:  Fluoroscopy Time: 6 minutes 12 seconds (25 mGy). COMPLICATIONS: None immediate. PROCEDURE: Informed written consent was obtained from the patient after a thorough discussion of the procedural risks, benefits and alternatives. All questions were addressed. Maximal Sterile Barrier Technique was utilized including caps, mask, sterile gowns, sterile gloves, sterile drape, hand hygiene and skin antiseptic. A timeout was performed prior to the initiation of the procedure. The left internal jugular vein was interrogated with ultrasound and found to be abnormal. A central venous catheter is present within the vessel. More superiorly in the neck, the vessel remains patent although the wall is thickened. However, as ultrasound was used to track more distally along the vessel, the vessel becomes narrowed surrounding the central venous catheter concerning for occlusion. As the patient has very poor access options, the decision was made to attempt puncture of the patent portion of the IJ to see of a wire would pass centrally. An image was obtained and stored for the medical record. Local anesthesia was attained by infiltration with 1% lidocaine. Under real-time sonographic guidance, the vessel was punctured with a 21 gauge micropuncture needle. Unfortunately, a wire could not be passed into the central veins. At this time, the decision was made to proceed with exchange of the existing central venous catheter for a temporary hemodialysis catheter with an additional third lumen for venous access. The retention sutures were cut of the existing triple-lumen catheter. The hub was cut off the catheter. A stiff glidewire was advanced through the central lumen. Fluoroscopically, the tip of the  existing catheter was actually within the azygos vein. The catheter was pulled back in till the tip was in the upper SVC. The stiff Glidewire was then successfully advanced through the heart and into the inferior vena cava. The central venous catheter was removed over the wire. The skin tract was dilated and a 20 cm triple-lumen non tunneled hemodialysis catheter was advanced over the wire and position with the tip at the superior cavoatrial junction. The catheter flushes and aspirates easily. The catheter was flushed with heparinized saline and secured to the skin with 0 Prolene suture. A sterile bandage was applied. IMPRESSION: 1. Occluded right internal jugular vein. 2. The left internal jugular vein is also occluded surrounding the existing central venous catheter. 3. A new catheter could not be placed, therefore the existing triple-lumen catheter was exchanged over a wire for a triple-lumen temporary hemodialysis catheter. Signed, Criselda Peaches, MD, [place] [facility] Radiology Electronically Signed   By: Jacqulynn Cadet M.D.   On: 09/10/2019 15:25   IR US Guide Vasc Access Right  Result Date: 09/16/2019 INDICATION: 79 year old male with a history of renal failure referred for hemodialysis catheter placement EXAM: TUNNELED CENTRAL VENOUS HEMODIALYSIS CATHETER PLACEMENT WITH ULTRASOUND AND FLUOROSCOPIC GUIDANCE MEDICATIONS: 2 g Ancef. The antibiotic was given in an appropriate time interval prior to skin puncture. ANESTHESIA/SEDATION: Moderate (conscious) sedation was employed during this procedure. A total of Versed 0 mg  and Fentanyl 50 mcg was administered intravenously. Moderate Sedation Time: 0 minutes. The patient's level of consciousness and vital signs were monitored continuously by radiology nursing throughout the procedure under my direct supervision. FLUOROSCOPY TIME:  Fluoroscopy Time: 0 minutes 18 seconds (3 mGy). COMPLICATIONS: None  PROCEDURE: Informed written consent was obtained from the patient after a discussion of the risks, benefits, and alternatives to treatment. Questions regarding the procedure were encouraged and answered. The right neck and chest were prepped with chlorhexidine in a sterile fashion, and a sterile drape was applied covering the operative field. Maximum barrier sterile technique with sterile gowns and gloves were used for the procedure. A timeout was performed prior to the initiation of the procedure. Ultrasound survey was performed. Right external jugular vein was patent. Right internal jugular vein is occluded at the inlet. Micropuncture kit was utilized to access the right external jugular vein under direct, real-time ultrasound guidance after the overlying soft tissues were anesthetized with 1% lidocaine with epinephrine. Stab incision was made with 11 blade scalpel. Microwire was passed centrally. The microwire was then marked to measure appropriate internal catheter length. External tunneled length was estimated. A total tip to cuff length of 23 cm was selected. 035 guidewire was advanced to the level of the IVC. Skin and subcutaneous tissues of chest wall below the clavicle were generously infiltrated with 1% lidocaine for local anesthesia. A small stab incision was made with 11 blade scalpel. The selected hemodialysis catheter was tunneled in a retrograde fashion from the anterior chest wall to the venotomy incision. Serial dilation was performed and then a peel-away sheath was placed. The catheter was then placed through the peel-away sheath with tips ultimately positioned within the superior aspect of the right atrium. Final catheter positioning was confirmed and documented with a spot radiographic image. The catheter aspirates and flushes normally. The catheter was flushed with appropriate volume heparin dwells. The catheter exit site was secured with a 0-Prolene retention suture. Gel-Foam slurry was infused  into the soft tissue tract. The venotomy incision was closed Derma bond and sterile dressing. Dressings were applied at the chest wall. Patient tolerated the procedure well and remained hemodynamically stable throughout. No complications were encountered and no significant blood loss encountered. IMPRESSION: Status post image guided placement of right external jugular tunneled hemodialysis catheter. Catheter ready for use. Signed, Dulcy Fanny. Dellia Nims, RPVI [facility] Rad[facility] Specialists [facility] Radiology Electronically Signed   By: Corrie Mckusick D.O.   On: 09/16/2019 11:57   DG CHEST PORT 1 VIEW  Result Date: 09/10/2019 CLINICAL DATA:  Elevated white count. EXAM: PORTABLE CHEST 1 VIEW COMPARISON:  Sep 03, 2019. FINDINGS: Stable cardiomediastinal silhouette. Atherosclerosis of thoracic aorta is noted. Left internal jugular catheter is unchanged in position. No pneumothorax is noted. Right lung is clear. Mild left basilar atelectasis or infiltrate is noted with small left pleural effusion. Bony thorax is unremarkable. IMPRESSION: Aortic atherosclerosis. Mild left basilar atelectasis or infiltrate is noted with small left pleural effusion. Aortic Atherosclerosis (ICD10-I70.0). Electronically Signed   By: Marijo Conception M.D.   On: 09/10/2019 10:57   DG CHEST PORT 1 VIEW  Result Date: 09/03/2019 CLINICAL DATA:  Central line placement. EXAM: PORTABLE CHEST 1 VIEW FINDINGS: Left IJ central venous catheter is coursing into the SVC but has a slight bend or curvature at its tip and it could be in the azygos vein. The cardiac silhouette, mediastinal and hilar contours are within normal limits and stable. There is moderate tortuosity  and calcification of the thoracic aorta. Stable eventration of the left hemidiaphragm with some overlying vascular crowding and atelectasis. Prominent skin fold over the left chest. No pneumothorax. IMPRESSION: 1. Left IJ central venous catheter is in the SVC but the  tip is likely in the azygos vein. 2. No acute cardiopulmonary findings. Electronically Signed   By: Marijo Sanes M.D.   On: 09/03/2019 13:34    Micro Results    Recent Results (from the past 240 hour(s))  Culture, blood (Routine X 2) w Reflex to ID Panel     Status: None   Collection Time: 09/10/19  9:20 AM   Specimen: BLOOD RIGHT HAND  Result Value Ref Range Status   Specimen Description BLOOD RIGHT HAND  Final   Special Requests   Final    BOTTLES DRAWN AEROBIC ONLY Blood Culture results may not be optimal due to an inadequate volume of blood received in culture bottles   Culture   Final    NO GROWTH 5 DAYS Performed at [facility], [place] [address] [place], [place] [address]    Report Status 09/15/2019 FINAL  Final  Culture, blood (Routine X 2) w Reflex to ID Panel     Status: None   Collection Time: 09/10/19 10:15 AM   Specimen: BLOOD RIGHT ARM  Result Value Ref Range Status   Specimen Description BLOOD RIGHT ARM  Final   Special Requests   Final    BOTTLES DRAWN AEROBIC ONLY Blood Culture adequate volume   Culture   Final    NO GROWTH 5 DAYS Performed at [facility], [place] [address] [place], [place] [address]    Report Status 09/15/2019 FINAL  Final  Culture, blood (routine x 2)     Status: None (Preliminary result)   Collection Time: 09/17/19 12:15 AM   Specimen: BLOOD  Result Value Ref Range Status   Specimen Description BLOOD RIGHT ARM  Final   Special Requests   Final    BOTTLES DRAWN AEROBIC ONLY Blood Culture results may not be optimal due to an inadequate volume of blood received in culture bottles   Culture   Final    NO GROWTH 1 DAY Performed at [facility], [place] [address] [place], [place] [address]    Report Status PENDING  Incomplete  Culture, blood (routine x 2)     Status: None (Preliminary result)   Collection Time: 09/17/19 12:15 AM   Specimen: BLOOD  Result Value Ref Range Status   Specimen Description BLOOD RIGHT HAND  Final    Special Requests   Final    BOTTLES DRAWN AEROBIC ONLY Blood Culture results may not be optimal due to an inadequate volume of blood received in culture bottles   Culture   Final    NO GROWTH 1 DAY Performed at [facility], [place] [address], [place],  [address]    Report Status PENDING  Incomplete       Today   Subjective:   Chay Mazzoni today has no headache,no chest or abdominal pain, denies any shortness of breath.   Objective:   Blood pressure (!) 102/56, pulse 95, temperature 98.6 F (37 C), temperature source Oral, resp. rate 17, height 5' 5.98" (1.676 m), weight 55.2 kg, SpO2 97 %.   Intake/Output Summary (Last 24 hours) at 09/18/2019 1116 Last data filed at 09/18/2019 0900 Gross per 24 hour  Intake 240 ml  Output 1 ml  Net 239 ml  Exam  Awake Alert, Oriented X 3, No new F.N deficits, Normal affect Symmetrical Chest wall movement, Good air movement bilaterally, CTAB RRR,No Gallops,Rubs or new Murmurs, No Parasternal Heave +ve B.Sounds, Abd Soft, No tenderness, No rebound - guarding or rigidity. Right TMA, left AKA, very minimal amount of blood can be seen through gauze, has chronic left upper extremity edema.    Data Review   CBC w Diff:  Lab Results  Component Value Date   WBC 23.3 (H) 09/17/2019   HGB 8.6 (L) 09/17/2019   HCT 26.8 (L) 09/17/2019   PLT 381 09/17/2019   LYMPHOPCT 15 09/15/2019   BANDSPCT 0 03/04/2015   MONOPCT 6 09/15/2019   EOSPCT 3 09/15/2019   BASOPCT 0 09/15/2019    CMP:  Lab Results  Component Value Date   NA 137 09/17/2019   K 4.4 09/17/2019   CL 97 (L) 09/17/2019   CO2 28 09/17/2019   BUN 22 09/17/2019   CREATININE 4.50 (H) 09/17/2019   CREATININE 10.80 (H) 07/06/2015   PROT 6.4 (L) 09/03/2019   ALBUMIN 1.7 (L) 09/17/2019   BILITOT 1.0 09/03/2019   ALKPHOS 65 09/03/2019   AST 16 09/03/2019   ALT 11 09/03/2019  .   Total Time in preparing paper work, data evaluation and todays exam - 41  minutes  Phillips Climes M.D on 09/18/2019 at 11:16 AM  [facility]   Office  [phone]

## 2019-09-18 NOTE — Discharge Instructions (Signed)
Follow with Primary MD [name], MD or SNF physician.  Get CBC, CMP,  checked  by Primary MD next visit.    Disposition SNF   Diet: Renal diet 1200 cc fluid restriction, dysphagia 2 with thin liquid  On your next visit with your primary care physician please Get Medicines reviewed and adjusted.   Please request your Prim.MD to go over all Hospital Tests and Procedure/Radiological results at the follow up, please get all Hospital records sent to your Prim MD by signing hospital release before you go home.   If you experience worsening of your admission symptoms, develop shortness of breath, life threatening emergency, suicidal or homicidal thoughts you must seek medical attention immediately by calling 911 or calling your MD immediately  if symptoms less severe.  You Must read complete instructions/literature along with all the possible adverse reactions/side effects for all the Medicines you take and that have been prescribed to you. Take any new Medicines after you have completely understood and accpet all the possible adverse reactions/side effects.   Do not drive, operating heavy machinery, perform activities at heights, swimming or participation in water activities or provide baby sitting services if your were admitted for syncope or siezures until you have seen by Primary MD or a Neurologist and advised to do so again.  Do not drive when taking Pain medications.    Do not take more than prescribed Pain, Sleep and Anxiety Medications  Special Instructions: If you have smoked or chewed Tobacco  in the last 2 yrs please stop smoking, stop any regular Alcohol  and or any Recreational drug use.  Wear Seat belts while driving.   Please note  You were cared for by a hospitalist during your hospital stay. If you have any questions about your discharge medications or the care you received while you were in the hospital after you are discharged, you can call the unit and asked to  speak with the hospitalist on call if the hospitalist that took care of you is not available. Once you are discharged, your primary care physician will handle any further medical issues. Please note that NO REFILLS for any discharge medications will be authorized once you are discharged, as it is imperative that you return to your primary care physician (or establish a relationship with a primary care physician if you do not have one) for your aftercare needs so that they can reassess your need for medications and monitor your lab values.

## 2019-09-19 LAB — CBC
HCT: 31.9 % — ABNORMAL LOW (ref 39.0–52.0)
Hemoglobin: 9.9 g/dL — ABNORMAL LOW (ref 13.0–17.0)
MCH: 20.5 pg — ABNORMAL LOW (ref 26.0–34.0)
MCHC: 31 g/dL (ref 30.0–36.0)
MCV: 66.2 fL — ABNORMAL LOW (ref 80.0–100.0)
Platelets: UNDETERMINED 10*3/uL (ref 150–400)
RBC: 4.82 MIL/uL (ref 4.22–5.81)
RDW: 25.2 % — ABNORMAL HIGH (ref 11.5–15.5)
WBC: 28 10*3/uL — ABNORMAL HIGH (ref 4.0–10.5)
nRBC: 0.1 % (ref 0.0–0.2)

## 2019-09-19 NOTE — Progress Notes (Signed)
[facility] Progress Note   Dialysis Orders: [facility]- MWF- 4 hours- Thigh AVG- BFR 400/DFR800- 2 K bath as OP- EDW 62. Gets heparin per tx- 2000 units per hour, epo 4000 and sensipar 60 mg. Last hgb there was 4/26 9.9  Assessment/ Plan:   1. GIB- EGD w/duodenal bulb ulcer. Gastric bx without malignancy or H. pylori. PPI per primary team. Hemoglobin relatively stable at this time 2. 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 Wound VAC out -> Graft did not have abruit 5/20 am for the 1st time. See below 3. Ventricular ectopy- asymptomatic. On amiodarone orally 4. ESRD- On HD MWF.Tolerating dialysis well with no issues. Continue to per his home schedule.Absolutely needs midodrine before HD.  HD 5/17(284 UF), 5/19 (net UF 212)5/21 (+500)  Next HD for mon; 1st shift.  Appreciate VIRplacing new REJ TC 5/20;  pulled the left sided temp cath 5/22 and held pressure for 15 min + pressure bandage + nurse instruction (minimal blood in sponge most of which was to clean up blood that had leaked onto his shoulder).  High white count likely inflammation, demargination. S/p amputation and source is no longer present, also no fevers.  5. Dialysis Access: Left lower extremity thigh graft unable to access because of wound VACbut for 1st time today (5/20) no bruit. Had temporary catheter because of leukocytosis -> REJ TC on 5/20 + LIJ temp out 5/22.   -D/w VVSon 5/20(Dr. Clark)but likely should wait a few weeks before attempting a declot given recent surgery on that limb; would like to let the wound heal fully before attempting a declot. In addition some blood seepage bet staples so another reason not to perform declot immediately. Would wait 2-4 weeks before attempting declot.   6. Hypotension/volume-no signs of significant volume overload on midodrine TID.  7. Anemiaof CKD-Hgb stable in 8s s/p 1unit pRBC. Status post  iron for low T sat of 11%. Continue ESA. 8. Secondary Hyperparathyroidism -calcium and phosphorus acceptable. Continue sensipar- does not get vit D with HD-phosphorus (4 5/21)  on binder)Continue to monitor 9. Nutrition- Renal diet w/fluid restrictions  Subjective:   Poor appetite, denies n/v/dyspnea No bleeding from the LIJ temp site removal from yest. Less bleeding from the left AKA wound; small amount from medial aspect of wound   Objective:   BP (!) 91/58 (BP Location: Right Arm)   Pulse 95   Temp 97.9 F (36.6 C) (Oral)   Resp 20   Ht 5' 5.98" (1.676 m)   Wt 55.2 kg   SpO2 90%   BMI 19.65 kg/m   Intake/Output Summary (Last 24 hours) at 09/19/2019 2956 Last data filed at 09/18/2019 1400 Gross per 24 hour  Intake 240 ml  Output -  Net 240 ml   Weight change:   Physical Exam: General:chronically ill appearing, frail male in NAD Heart:RRR, +2/1 systolic murmur Lungs:CTA anteriolaterally, bilateral chest rise Abdomen:soft, NTND Extremities:L AKA with staples, rt TMA Dialysis Access: L thigh AVGNObruit , wound vac out now w/ staples (clean with no blood when I examined this am) REJ TC  Imaging: No results found.  Labs: BMET Recent Labs  Lab 09/13/19 0506 09/14/19 0416 09/15/19 0424 09/17/19 0716  NA 136 136 136 137  K 5.1 3.7 4.0 4.4  CL 103 100 96* 97*  CO2 22 28 27 28   GLUCOSE 83 94 95 95  BUN 28* 10 19 22   CREATININE 5.86* 3.18* 4.61* 4.50*  CALCIUM 7.6* 7.4* 7.7* 7.7*  PHOS 4.0  --  3.5 4.0   CBC Recent Labs  Lab 09/13/19 0506 09/13/19 0506 09/14/19 0416 09/15/19 0424 09/17/19 0015 09/17/19 0525  WBC 24.9*   < > 20.6* 22.9* 24.5* 23.3*  NEUTROABS 19.2*  --   --  17.3*  --   --   HGB 8.8*   < > 7.8* 8.0* 10.0* 8.6*  HCT 27.8*   < > 24.8* 25.4* 31.3* 26.8*  MCV 65.9*   < > 65.6* 66.3* 64.9* 65.5*  PLT PLATELET CLUMPS NOTED ON SMEAR, UNABLE TO ESTIMATE   < > 309 349 386 381   < > = values in this interval not displayed.     Medications:    . amiodarone  200 mg Oral Daily  . vitamin C  500 mg Oral Q24H  . atorvastatin  40 mg Oral q1800  . B-complex with vitamin C  1 tablet Oral Daily  . Chlorhexidine Gluconate Cloth  6 each Topical Q0600  . cholecalciferol  1,000 Units Oral Daily  . cinacalcet  60 mg Oral QHS  . darbepoetin (ARANESP) injection - DIALYSIS  100 mcg Intravenous Q Mon-HD  . docusate sodium  100 mg Oral BID  . feeding supplement (PRO-STAT SUGAR FREE 64)  30 mL Oral TID  . melatonin  3 mg Oral QHS  . midodrine  10 mg Oral TID WC  . pantoprazole sodium  40 mg Oral BID  . sodium chloride flush  10-40 mL Intracatheter Q12H  . sodium chloride flush  3 mL Intravenous Q12H  . sodium chloride flush  3 mL Intravenous Q12H      Otelia Santee, MD 09/19/2019, 7:24 AM

## 2019-09-19 NOTE — Progress Notes (Signed)
PROGRESS NOTE    [name]  PPI:[id number] DOB: Dec 14, 1940 DOA: 09/03/2019 PCP: [name], MD    No chief complaint on file.   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 However in the preoperative area, patient noted to have rectal bleeding for which Home GI was consulted and advised that the procedure will need to be postponed.  Patient was subsequently admitted.  Subjective: Patient denies any pain, fever or chills, denies any nausea and vomiting, appetite remains poor.  Assessment & Plan:   Principal Problem:   GI bleeding Active Problems:   Encounter for central line placement   ESRD on dialysis [facility])   Aortic stenosis   PVD (peripheral vascular disease) ([place])   Essential hypertension   Duodenal ulcer   Palliative care encounter   DNR (do not resuscitate) discussion   Goals of care, counseling/discussion   Dehiscence of amputation stump ([place])   Chronic hypotension   SVT (supraventricular tachycardia) ([facility])   Ventricular ectopy  Upper GI bleed secondary to duodenal ulcer/acute blood loss anemia Patient noted to have some rectal  bleeding in the preop area prior to surgery and subsequently admitted.  Patient transfused a unit of packed red blood cells 09/04/2019.  Plavix currently on hold.  Status post upper endoscopy 09/04/2019 that showed duodenal ulcer which was likely cause of patient's bleeding per GI.  Colonoscopy subsequently was not performed as patient also had a poor prep. -Continue to monitor hemoglobin and transfuse as needed, so far hemoglobin remained stable H&H stable at 7.8.  Continue PPI every 12 hours.  Continue to hold Overall hemoglobin remained stable, will continue to monitor closely. -Resumed back on Plavix, as it was cleared per my discussion with GI .  End-stage renal disease on hemodialysis - Patient noted per nephrology that dressing from wound VAC currently over patient's AV graft. (And currently wound dressing). -IR has been consulted for tunnel dialysis dialysis catheter placement(which has postponed initially given markedly elevated white blood cell, but patient appears nontoxic, with chronic leukocytosis, with no evidence of acute infection, he had his tunneled catheter placed 5/20. -Hemodialysis management per renal.  Progressive gangrenous necrotic changes with stump dehiscence of left transtibial amputation site - Patient seen by orthopedics.  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  Patient initially with wound VAC, has been seen by orthopedic where it has been discontinued . currently on Prevena boot.  Outpatient follow-up with orthopedics.  Per orthopedics. -Hold blood at AKA site, so we will hold Plavix for 48 hours.  Leukocytosis Likely secondary to problem #3.  Leukocytosis was fluctuating but slowly started to trend down. Status post left AKA.  Patient started on IV Zyvox which is subsequently been discontinued..  Repeat blood cultures with no growth to date.  Due to worsening leukocytosis ID consulted who recommended discontinuation of antibiotics,  MRI of the right foot, and consideration for CT  chest abdomen and pelvis if worsening WBC and no cause found.  ID have signed off.   -He still having some significant leukocytosis but  he is nontoxic-appearing, this appears to be somehow chronic over now for 2 months  Chronic diastolic heart failure Compensated.  On hemodialysis.  Per nephrology.  Chronic hypotension Midodrine.    SVT Patient noted to have a run of SVT with heart rates as high as the 200s in the PACU postoperatively on 09/08/2019.  Patient received a dose of IV metoprolol and SVT broken and heart rate in the 70s.  Patient started on amiodarone and dose increased to 200 mg daily per cardiology for ventricular ectopy.  Outpatient follow-up with cardiology.    Ventricular ectopy Patient started on amiodarone per cardiology.  Amiodarone dose increased to 200 mg daily per cardiology.  Outpatient follow-up with cardiology.  Follow.  Severe Aortic stenosis Noted on TEE during previous admission.  Patient noted to not be a candidate for TAVR.  Avoid hypotension.  Cardiology was following but have signed off.    Prolonged QTC Repeat EKG with QTC of 509.  Keep magnesium > 2.  Keep potassium greater > 4.  Iron deficiency anemia/anemia of chronic disease -Continue to monitor closely and transfuse as needed   History of CVA Plavix currently on hold.  Could potentially likely resume Plavix after permanent catheter has been placed before discharge.    Generalized deconditioning Patient overall with a poor prognosis.  Palliative care has assessed and followed patient.  Patient now with partial code.  Patient wishes to have CPR and ACLS medication but no intubation or shock.  PT/OT.  Needs SNF.     DVT prophylaxis: Recent GI bleed.  Avoiding chemical prophylaxis.  SCDs. Code Status: Partial Family Communication: Updated patient, no family at bedside.  Disposition:   Status is: Inpatient    Dispo: The patient is from: SNF               Anticipated d/c is to: SNF              Anticipated d/c date is: 2 days -Patient is currently cleared for discharge, but there is no SNF bed availability till Monday.     Consultants:   Gastroenterology: Dr. Tarri Glenn 09/03/2019  Nephrology: Dr.Schertz 09/03/2019  Palliative care: Lindell Spar, NP 09/04/2019  Cardiology: Dr. Lovena Le 09/04/2019  Domenic Moras, FNP 09/07/2019  Orthopedics: Dr. Sharol Given  ID: Dr. Tommy Medal 09/10/2019  Procedures:   Chest x-ray 09/03/2019  Upper endoscopy 09/04/2019--per Dr. Tarri Glenn  Left above-the-knee amputation per Dr. Sharol Given 09/08/2019 EGD on 09/04/2019 Impression: - Duodenal ulcer may be the cause of recent overt  GI bleeding. 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 Biopsied. - Erosive gastropathy with no bleeding and no  stigmata of recent bleeding. Biopsied. - A single non-bleeding angioectasia in the  duodenum. - Small hiatal hernia. - Unable to proceed with colonoscopy as planned due  to incomplete preparation. Patient had formed stool  on last bowel movement. Recommendation: - Return patient to hospital ward for ongoing care. - Clear liquid diet. Advance as tolerated. - No aspirin, ibuprofen, naproxen, or other  non-steroidal anti-inflammatory drugs. - Patient has a prolonged QT. Discussed with Dr.  Starla Link who agreed with trial of pantoprazole 40 mg  IV BID with close monitoring of QTc. - Will defer colonoscopy at this time given the  challenges that the patient has  prepping for the  procedure. Duodenal ulcer may be the source of  bleeding. - Continue  serial hgb/hct with transfusion as  indicated.  Exchange of left IJ central line for triple-lumen HD catheter--per IR Dr. Laurence Ferrari 09/10/2019  MRI right foot 09/10/2019  Antimicrobials:   IV Ancef 09/08/2019 preoperatively  IV Zyvox 09/08/2019>>>>> 09/10/2019    Objective: Vitals:   09/18/19 1209 09/18/19 1630 09/18/19 2000 09/19/19 0600  BP:  (!) 91/58    Pulse:      Resp:  20    Temp: 99.3 F (37.4 C)  98 F (36.7 C) 97.9 F (36.6 C)  TempSrc: Axillary  Oral Oral  SpO2:  90%    Weight:      Height:       No intake or output data in the 24 hours ending 09/19/19 1503 Filed Weights   09/16/19 0519 09/17/19 0509 09/17/19 0653  Weight: 54.4 kg 53.3 kg 55.2 kg    Examination:  Awake Alert, Oriented X 3, No new F.N deficits, Normal affect Symmetrical Chest wall movement, Good air movement bilaterally, CTAB RRR,No Gallops,Rubs or new Murmurs, No Parasternal Heave +ve B.Sounds, Abd Soft, No tenderness, No rebound - guarding or rigidity.  No Cyanosis, status post left AKA, with small amount of blood can be seen through gauze, right foot with transmetatarsal amputation.  Sacral pressure ulcer.       Data Reviewed: I have personally reviewed following labs and imaging studies  CBC: Recent Labs  Lab 09/13/19 0506 09/13/19 0506 09/14/19 0416 09/15/19 0424 09/17/19 0015 09/17/19 0525 09/19/19 0855  WBC 24.9*   < > 20.6* 22.9* 24.5* 23.3* 28.0*  NEUTROABS 19.2*  --   --  17.3*  --   --   --   HGB 8.8*   < > 7.8* 8.0* 10.0* 8.6* 9.9*  HCT 27.8*   < > 24.8* 25.4* 31.3* 26.8* 31.9*  MCV 65.9*   < > 65.6* 66.3* 64.9* 65.5* 66.2*  PLT PLATELET CLUMPS NOTED ON SMEAR, UNABLE TO ESTIMATE   < > 309 349 386 381 PLATELET CLUMPS NOTED ON SMEAR, UNABLE TO ESTIMATE   < > = values in this  interval not displayed.    Basic Metabolic Panel: Recent Labs  Lab 09/13/19 0506 09/14/19 0416 09/15/19 0424 09/17/19 0716  NA 136 136 136 137  K 5.1 3.7 4.0 4.4  CL 103 100 96* 97*  CO2 22 28 27 28   GLUCOSE 83 94 95 95  BUN 28* 10 19 22   CREATININE 5.86* 3.18* 4.61* 4.50*  CALCIUM 7.6* 7.4* 7.7* 7.7*  PHOS 4.0  --  3.5 4.0    GFR: Estimated Creatinine Clearance: 10.9 mL/min (A) (by C-G formula based on SCr of 4.5 mg/dL (H)).  Liver Function Tests: Recent Labs  Lab 09/13/19 0506 09/15/19 0424 09/17/19 0716  ALBUMIN 1.5* 1.7* 1.7*    CBG: Recent Labs  Lab 09/15/19 0625 09/15/19 0641  GLUCAP 96 120*     Recent Results (from the past 240 hour(s))  Culture, blood (Routine X 2) w Reflex to ID Panel     Status: None   Collection Time: 09/10/19  9:20 AM   Specimen: BLOOD RIGHT HAND  Result Value Ref Range Status   Specimen Description BLOOD RIGHT HAND  Final   Special Requests   Final    BOTTLES DRAWN AEROBIC ONLY Blood Culture results may not be optimal due to an inadequate volume of blood received in culture bottles   Culture   Final    NO GROWTH 5 DAYS Performed at [facility], [place],  [place] [address]    Report Status 09/15/2019 FINAL  Final  Culture, blood (Routine X 2) w Reflex to ID Panel     Status: None   Collection Time: 09/10/19 10:15 AM   Specimen: BLOOD RIGHT ARM  Result Value Ref Range Status   Specimen Description BLOOD RIGHT ARM  Final   Special Requests   Final    BOTTLES DRAWN AEROBIC ONLY Blood Culture adequate volume   Culture   Final    NO GROWTH 5 DAYS Performed at [facility], [place] [address] [place], [place] [address]    Report Status 09/15/2019 FINAL  Final  Culture, blood (routine x 2)     Status: None (Preliminary result)   Collection Time: 09/17/19 12:15 AM   Specimen: BLOOD  Result Value Ref Range Status   Specimen Description BLOOD RIGHT ARM  Final   Special Requests   Final    BOTTLES  DRAWN AEROBIC ONLY Blood Culture results may not be optimal due to an inadequate volume of blood received in culture bottles   Culture   Final    NO GROWTH 2 DAYS Performed at [facility], [place] [address], [place], [place] [address]    Report Status PENDING  Incomplete  Culture, blood (routine x 2)     Status: None (Preliminary result)   Collection Time: 09/17/19 12:15 AM   Specimen: BLOOD  Result Value Ref Range Status   Specimen Description BLOOD RIGHT HAND  Final   Special Requests   Final    BOTTLES DRAWN AEROBIC ONLY Blood Culture results may not be optimal due to an inadequate volume of blood received in culture bottles   Culture   Final    NO GROWTH 2 DAYS Performed at [facility], [place] [address], [place], [place] [address]    Report Status PENDING  Incomplete         Radiology Studies: No results found.      Scheduled Meds: . amiodarone  200 mg Oral Daily  . vitamin C  500 mg Oral Q24H  . atorvastatin  40 mg Oral q1800  . B-complex with vitamin C  1 tablet Oral Daily  . Chlorhexidine Gluconate Cloth  6 each Topical Q0600  . cholecalciferol  1,000 Units Oral Daily  . cinacalcet  60 mg Oral QHS  . darbepoetin (ARANESP) injection - DIALYSIS  100 mcg Intravenous Q Mon-HD  . docusate sodium  100 mg Oral BID  . feeding supplement (PRO-STAT SUGAR FREE 64)  30 mL Oral TID  . melatonin  3 mg Oral QHS  . midodrine  10 mg Oral TID WC  . pantoprazole sodium  40 mg Oral BID  . sodium chloride flush  10-40 mL Intracatheter Q12H  . sodium chloride flush  3 mL Intravenous Q12H  . sodium chloride flush  3 mL Intravenous Q12H   Continuous Infusions: . sodium chloride Stopped (09/04/19 1236)     LOS: 16 days     Phillips Climes, MD [facility]   09/19/2019, 3:03 PM

## 2019-09-20 LAB — CBC
HCT: 25.4 % — ABNORMAL LOW (ref 39.0–52.0)
Hemoglobin: 8.1 g/dL — ABNORMAL LOW (ref 13.0–17.0)
MCH: 21.2 pg — ABNORMAL LOW (ref 26.0–34.0)
MCHC: 31.9 g/dL (ref 30.0–36.0)
MCV: 66.5 fL — ABNORMAL LOW (ref 80.0–100.0)
Platelets: 321 10*3/uL (ref 150–400)
RBC: 3.82 MIL/uL — ABNORMAL LOW (ref 4.22–5.81)
RDW: 24.7 % — ABNORMAL HIGH (ref 11.5–15.5)
WBC: 28.2 10*3/uL — ABNORMAL HIGH (ref 4.0–10.5)
nRBC: 0.1 % (ref 0.0–0.2)

## 2019-09-20 LAB — BASIC METABOLIC PANEL
Anion gap: 9 (ref 5–15)
BUN: 9 mg/dL (ref 8–23)
CO2: 27 mmol/L (ref 22–32)
Calcium: 7.8 mg/dL — ABNORMAL LOW (ref 8.9–10.3)
Chloride: 101 mmol/L (ref 98–111)
Creatinine, Ser: 2.24 mg/dL — ABNORMAL HIGH (ref 0.61–1.24)
GFR calc Af Amer: 32 mL/min — ABNORMAL LOW (ref >60)
GFR calc non Af Amer: 27 mL/min — ABNORMAL LOW (ref >60)
Glucose, Bld: 93 mg/dL (ref 70–99)
Potassium: 3.3 mmol/L — ABNORMAL LOW (ref 3.5–5.1)
Sodium: 137 mmol/L (ref 135–145)

## 2019-09-20 LAB — MAGNESIUM: Magnesium: 1.7 mg/dL (ref 1.7–2.4)

## 2019-09-20 LAB — RENAL FUNCTION PANEL
Albumin: 1.9 g/dL — ABNORMAL LOW (ref 3.5–5.0)
Anion gap: 10 (ref 5–15)
BUN: 20 mg/dL (ref 8–23)
CO2: 28 mmol/L (ref 22–32)
Calcium: 7.5 mg/dL — ABNORMAL LOW (ref 8.9–10.3)
Chloride: 98 mmol/L (ref 98–111)
Creatinine, Ser: 3.32 mg/dL — ABNORMAL HIGH (ref 0.61–1.24)
GFR calc Af Amer: 20 mL/min — ABNORMAL LOW (ref >60)
GFR calc non Af Amer: 17 mL/min — ABNORMAL LOW (ref >60)
Glucose, Bld: 95 mg/dL (ref 70–99)
Phosphorus: 2.5 mg/dL (ref 2.5–4.6)
Potassium: 3.1 mmol/L — ABNORMAL LOW (ref 3.5–5.1)
Sodium: 136 mmol/L (ref 135–145)

## 2019-09-20 MED ORDER — DARBEPOETIN ALFA 100 MCG/0.5ML IJ SOSY
PREFILLED_SYRINGE | INTRAMUSCULAR | Status: AC
  Filled 2019-09-20: qty 0.5

## 2019-09-20 MED ORDER — PANTOPRAZOLE SODIUM 40 MG PO TBEC: 40.0000 mg | DELAYED_RELEASE_TABLET | Freq: Two times a day (BID) | ORAL | Status: AC

## 2019-09-20 MED ORDER — ALBUMIN HUMAN 25 % IV SOLN
INTRAVENOUS | Status: AC
  Administered 2019-09-20: 25 g
  Filled 2019-09-20: qty 100

## 2019-09-20 MED ORDER — MIDODRINE HCL 10 MG PO TABS: 10.0000 mg | ORAL_TABLET | Freq: Three times a day (TID) | ORAL | 0 refills | Status: AC

## 2019-09-20 MED ORDER — HEPARIN SODIUM (PORCINE) 1000 UNIT/ML IJ SOLN
INTRAMUSCULAR | Status: AC
  Filled 2019-09-20: qty 4

## 2019-09-20 MED ORDER — CLOPIDOGREL BISULFATE 75 MG PO TABS: 75.0000 mg | ORAL_TABLET | Freq: Every day | ORAL | Status: AC

## 2019-09-20 NOTE — Progress Notes (Addendum)
Received call from CCMD that patient is having frequent PVC's to trigeminy. QTc around 550. Ectopy not noted prior to HD session. Dr. Waldron Labs notified.    Addendum: labs ordered. Discharge cancelled. Will continue to monitor.

## 2019-09-20 NOTE — Progress Notes (Signed)
[facility] Progress Note   Dialysis Orders: [facility]- MWF- 4 hours- Thigh AVG- BFR 400/DFR800- 2 K bath as OP- EDW 62. Gets heparin per tx- 2000 units per hour, epo 4000 and sensipar 60 mg. Last hgb there was 4/26 9.9  Assessment/ Plan:   1. GIB- EGD w/duodenal bulb ulcer. Gastric bx without malignancy or H. pylori. PPI per primary team. Hemoglobin relatively stable at this time 2. 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 Wound VAC out 3. Ventricular ectopy- asymptomatic. On amiodarone orally 4. ESRD- On HD MWF.Tolerating dialysis well with no issues. Continue to per his home schedule.Absolutely needs midodrine before HD.  HD 5/17(284 UF), 5/19 (net UF 212)5/21 (+500)  HD today; MWF outpt.  Dialysis Access: Left lower extremity thigh graft unable to access because of wound VACand 5/20 no bruit.  Dr. Augustin Coupe d/w Dr. Carlis Abbott VVS - plan to wait 2-4 weeks to attempt declot ( would like to let the wound heal fully before attempting a declot. In addition some blood seepage bet staples so another reason not to perform declot immediately).  Had temporary catheter because of leukocytosis -> REJ TC on 5/20 + LIJ temp out 5/22.   6. Hypotension/volume-no signs of significant volume overload on midodrine TID (he says LUE edema always present).  7. Anemiaof CKD-Hgb stable in 8-9s s/p 1unit pRBC. Status post iron for low T sat of 11%. Continue ESA. 8. Secondary Hyperparathyroidism -calcium and phosphorus acceptable. Continue sensipar- does not get vit D with HD-phosphorus (4 5/21)  on binder)Continue to monitor 9. Nutrition- Renal diet w/fluid restrictions  Subjective:   Poor appetite, denies n/v/dyspnea Denies symptomatic hypotension or other issue with HD.     Objective:   BP (!) 90/51 (BP Location: Right Arm)   Pulse 72   Temp 98.4 F (36.9 C) (Oral)   Resp 14   Ht 5' 5.98" (1.676 m)   Wt 55.2 kg   SpO2 96%    BMI 19.65 kg/m   Intake/Output Summary (Last 24 hours) at 09/20/2019 1102 Last data filed at 09/19/2019 1900 Gross per 24 hour  Intake 240 ml  Output --  Net 240 ml   Weight change:   Physical Exam: General:chronically ill appearing, frail male in NAD, lying flat in bed on HD.  Heart:RRR, +1/9 systolic murmur Lungs:CTA anteriolaterally, bilateral chest rise Abdomen:soft, NTND Extremities:L AKA with staples, rt TMA; LUE 2+ pitting edema he says is chronic Dialysis Access: L thigh AVGNObruit , wound vac out now w/ staples   REJ TC  Imaging: No results found.  Labs: BMET Recent Labs  Lab 09/14/19 0416 09/15/19 0424 09/17/19 0716  NA 136 136 137  K 3.7 4.0 4.4  CL 100 96* 97*  CO2 28 27 28   GLUCOSE 94 95 95  BUN 10 19 22   CREATININE 3.18* 4.61* 4.50*  CALCIUM 7.4* 7.7* 7.7*  PHOS  --  3.5 4.0   CBC Recent Labs  Lab 09/15/19 0424 09/17/19 0015 09/17/19 0525 09/19/19 0855  WBC 22.9* 24.5* 23.3* 28.0*  NEUTROABS 17.3*  --   --   --   HGB 8.0* 10.0* 8.6* 9.9*  HCT 25.4* 31.3* 26.8* 31.9*  MCV 66.3* 64.9* 65.5* 66.2*  PLT 349 386 381 PLATELET CLUMPS NOTED ON SMEAR, UNABLE TO ESTIMATE    Medications:    . amiodarone  200 mg Oral Daily  . vitamin C  500 mg Oral Q24H  . atorvastatin  40 mg Oral q1800  . B-complex with  vitamin C  1 tablet Oral Daily  . Chlorhexidine Gluconate Cloth  6 each Topical Q0600  . cholecalciferol  1,000 Units Oral Daily  . cinacalcet  60 mg Oral QHS  . darbepoetin (ARANESP) injection - DIALYSIS  100 mcg Intravenous Q Mon-HD  . docusate sodium  100 mg Oral BID  . feeding supplement (PRO-STAT SUGAR FREE 64)  30 mL Oral TID  . melatonin  3 mg Oral QHS  . midodrine  10 mg Oral TID WC  . pantoprazole sodium  40 mg Oral BID  . sodium chloride flush  10-40 mL Intracatheter Q12H  . sodium chloride flush  3 mL Intravenous Q12H  . sodium chloride flush  3 mL Intravenous Q12H    Jannifer Hick MD [facility] Pager  [phone]

## 2019-09-20 NOTE — Care Management Important Message (Signed)
Important Message  Patient Details  Name: [name]. MRN: [id number] Date of Birth: 23-Sep-1940   Medicare Important Message Given:  Yes - Important Message mailed due to current National Emergency  Verbal consent obtained due to current National Emergency  Relationship to patient: Self Contact Name: Boleslaw Borghi Call Date: 09/20/19  Time: 1302 Phone: [phone] Outcome: Spoke with contact Important Message mailed to: Patient address on file    Delorse Lek 09/20/2019, 1:02 PM

## 2019-09-20 NOTE — Progress Notes (Signed)
Patient being transported to dialysis at this time.

## 2019-09-20 NOTE — Progress Notes (Signed)
Received call from lab that sample obtained was not enough to run. Will reorder BMP w/ magnesium.

## 2019-09-20 NOTE — TOC Progression Note (Addendum)
Transition of Care (TOC) - Progression Note    Patient Details  Name: DRAYTON TIEU Sr. MRN: [id number] Date of Birth: 1940-12-08  Transition of Care [facility]) CM/SW [place], LCSW Phone Number: 09/20/2019, 8:52 AM  Clinical Narrative:    [facility] able to accept patient today after dialysis. Left vm for patient's daughter.    Expected Discharge Plan: [place] Barriers to Discharge: No Barriers Identified  Expected Discharge Plan and Services Expected Discharge Plan: [place] In-house Referral: Clinical Social Work   Post Acute Care Choice: [place] Living arrangements for the past 2 months: [place] Expected Discharge Date: 09/18/19                                     Social Determinants of Health (SDOH) Interventions    Readmission Risk Interventions Readmission Risk Prevention Plan 09/10/2019 09/08/2019 09/08/2019  Transportation Screening Complete - Complete  PCP or Specialist Appt within 3-5 Days - - Complete  HRI or [place] - - Complete  Social Work Consult for [place] Planning/Counseling - - Complete  Palliative Care Screening - Complete Not Applicable  Medication Review Press photographer) - - Complete  Some recent data might be hidden

## 2019-09-20 NOTE — Progress Notes (Addendum)
PROGRESS NOTE    [name]  IZT:[id number] DOB: Jul 22, 1940 DOA: 09/03/2019 PCP: [name], MD    No chief complaint on file.   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 However in the preoperative area, patient noted to have rectal bleeding for which [facility] was consulted and advised that the procedure will need to be postponed.  Patient was subsequently admitted.  Subjective: Patient denies any pain, fever or chills, denies any nausea and vomiting.  Assessment & Plan:   Principal Problem:   GI bleeding Active Problems:   Encounter for central line placement   ESRD on dialysis [facility])   Aortic stenosis   PVD (peripheral vascular disease) ([place])   Essential hypertension   Duodenal ulcer   Palliative care encounter   DNR (do not resuscitate) discussion   Goals of care, counseling/discussion   Dehiscence of amputation stump ([name])   Chronic hypotension   SVT (supraventricular tachycardia) ([facility])   Ventricular ectopy  Upper GI bleed secondary to duodenal ulcer/acute blood loss anemia Patient noted to have some rectal bleeding in the preop area  prior to surgery and subsequently admitted.  Patient transfused a unit of packed red blood cells 09/04/2019.  Plavix currently on hold.  Status post upper endoscopy 09/04/2019 that showed duodenal ulcer which was likely cause of patient's bleeding per GI.  Colonoscopy subsequently was not performed as patient also had a poor prep. -Continue to monitor hemoglobin and transfuse as needed, so far hemoglobin remained stable H&H stable at 7.8.  Continue PPI every 12 hours.  Continue to hold Overall hemoglobin remained stable, will continue to monitor closely. -Resumed back on Plavix.  End-stage renal disease on hemodialysis - Patient noted per nephrology that dressing from wound VAC currently over patient's AV graft. (And currently wound dressing). -IR has been consulted for tunnel dialysis dialysis catheter placement(which has postponed initially given markedly elevated white blood cell, but patient appears nontoxic, with chronic leukocytosis, with no evidence of acute infection, he had his tunneled catheter placed 5/20. -Hemodialysis management per renal.  Progressive gangrenous necrotic changes with stump dehiscence of left transtibial amputation site - Patient seen by orthopedics.  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  Patient initially with wound VAC, has been seen by orthopedic where it has been discontinued . currently on Prevena boot.  Outpatient follow-up with orthopedics.  Per orthopedics. -Hold blood at AKA site, so we will hold Plavix for 48 hours.  Leukocytosis Likely secondary to problem #3.  Leukocytosis was fluctuating but slowly started to trend down. Status post left AKA.  Patient started on IV Zyvox which is subsequently been discontinued..  Repeat blood cultures with no growth to date.  Due to worsening leukocytosis ID consulted who recommended discontinuation of antibiotics, MRI of the right foot, and consideration for CT chest abdomen and pelvis  if worsening WBC and no cause found.  ID have signed off.   -He still having some significant leukocytosis but  he is nontoxic-appearing, this appears to be somehow chronic over now for 2 months  Chronic diastolic heart failure Compensated.  On hemodialysis.  Per nephrology.  Chronic hypotension Midodrine.    SVT Patient noted to have a run of SVT with heart rates as high as the 200s in the PACU postoperatively on 09/08/2019.  Patient received a dose of IV metoprolol and SVT broken and heart rate in the 70s.  Patient started on amiodarone and dose increased to 200 mg daily per cardiology for ventricular ectopy.  Outpatient follow-up with cardiology.    Ventricular ectopy Patient started on amiodarone per cardiology.  Amiodarone dose increased to 200 mg daily per cardiology.  Outpatient follow-up with cardiology.  Follow. -After dialysis session today, patient was noted to have significant ectopy, and his QTC was prolonged at 609, so his discharge has been held, will repeat BMP (potassium was 3.1 before dialysis, so I do anticipate this cannot be on the lower side), and will check magnesium, did stop prolonging agents Zofran, as well will hold Protonix for now.  Severe Aortic stenosis Noted on TEE during previous admission.  Patient noted to not be a candidate for TAVR.  Avoid hypotension.  Cardiology was following but have signed off.    Prolonged QTC Repeat EKG with QTC of 509.  Keep magnesium > 2.  Keep potassium greater > 4.  Iron deficiency anemia/anemia of chronic disease -Continue to monitor closely and transfuse as needed   History of CVA Plavix currently on hold.  Could potentially likely resume Plavix after permanent catheter has been placed before discharge.    Generalized deconditioning Patient overall with a poor prognosis.  Palliative care has assessed and followed patient.  Patient now with partial code.  Patient wishes to have CPR and ACLS medication but no intubation or  shock.  PT/OT.  Needs SNF.    Sacral pressure ulcer -Wound care Pressure Injury 09/05/19 Sacrum Circumferential Stage 2 -  Partial thickness loss of dermis presenting as a shallow open injury with a red, pink wound bed without slough. This wound was documented on previous admission in April (Active)  09/05/19 2000  Location: Sacrum  Location Orientation: Circumferential  Staging: Stage 2 -  Partial thickness loss of dermis presenting as a shallow open injury with a red, pink wound bed without slough.  Wound Description (Comments): This wound was documented on previous admission in April  Present on Admission: Yes        DVT prophylaxis: Recent GI bleed.  Avoiding chemical prophylaxis.  SCDs. Code Status: Partial Family Communication: Updated patient, no family at bedside.  Disposition:   Status is: Inpatient    Dispo: The patient is from: SNF              Anticipated d/c is to: SNF              Anticipated d/c date is: 2 days -Patient is currently cleared for discharge, but there is no SNF bed availability till Monday.     Consultants:   Gastroenterology: Dr. Tarri Glenn 09/03/2019  Nephrology: Dr.Schertz 09/03/2019  Palliative care: Lindell Spar, NP 09/04/2019  Cardiology: Dr. Lovena Le 09/04/2019  Domenic Moras, FNP 09/07/2019  Orthopedics: Dr. Sharol Given  ID: Dr. Tommy Medal 09/10/2019  Procedures:   Chest x-ray 09/03/2019  Upper endoscopy 09/04/2019--per Dr. Tarri Glenn  Left above-the-knee amputation per Dr. Sharol Given 09/08/2019 EGD on 09/04/2019 Impression: - Duodenal ulcer may be  the cause of recent overt  GI bleeding. 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 Biopsied. - Erosive gastropathy with no bleeding and no  stigmata of recent bleeding. Biopsied. - A single non-bleeding angioectasia in the   duodenum. - Small hiatal hernia. - Unable to proceed with colonoscopy as planned due  to incomplete preparation. Patient had formed stool  on last bowel movement. Recommendation: - Return patient to hospital ward for ongoing care. - Clear liquid diet. Advance as tolerated. - No aspirin, ibuprofen, naproxen, or other  non-steroidal anti-inflammatory drugs. - Patient has a prolonged QT. Discussed with Dr.  Starla Link who agreed with trial of pantoprazole 40 mg  IV BID with close monitoring of QTc. - Will defer colonoscopy at this time given the  challenges that the patient has prepping for the  procedure. Duodenal ulcer may be the source of  bleeding. - Continue serial hgb/hct with transfusion as  indicated.  Exchange of left IJ central line for triple-lumen HD catheter--per IR Dr. Laurence Ferrari 09/10/2019  MRI right foot 09/10/2019  Antimicrobials:   IV Ancef 09/08/2019 preoperatively  IV Zyvox 09/08/2019>>>>> 09/10/2019    Objective: Vitals:   09/20/19 1330 09/20/19 1400 09/20/19 1414 09/20/19 1441  BP: (!) 93/47 (!) 88/52 (!) 98/53 (!) 92/46  Pulse: 67 86 86 84  Resp: 17 17 18 20   Temp:   98.2 F (36.8 C) 97.7 F (36.5 C)  TempSrc:   Oral Oral  SpO2:    98%  Weight:      Height:        Intake/Output Summary (Last 24 hours) at 09/20/2019 1725 Last data filed at 09/20/2019 1526 Gross per 24 hour  Intake 180 ml  Output -200 ml  Net 380 ml   Filed Weights   09/17/19 0653 09/20/19 0400 09/20/19 1038  Weight: 55.2 kg 55.2 kg 53.2 kg     Examination:  Awake Alert, Oriented X 3, No new F.N deficits, Normal affect Symmetrical Chest wall movement, Good air movement bilaterally, CTAB RRR,No Gallops,Rubs or new Murmurs, No Parasternal Heave +ve B.Sounds, Abd Soft, No tenderness, No rebound - guarding or rigidity. No Cyanosis, status post left AKA, with small amount of blood can be seen through gauze, right foot with transmetatarsal amputation.  Sacral pressure ulcer.       Data Reviewed: I have personally reviewed following labs and imaging studies  CBC: Recent Labs  Lab 09/15/19 0424 09/17/19 0015 09/17/19 0525 09/19/19 0855 09/20/19 1145  WBC 22.9* 24.5* 23.3* 28.0* 28.2*  NEUTROABS 17.3*  --   --   --   --   HGB 8.0* 10.0* 8.6* 9.9* 8.1*  HCT 25.4* 31.3* 26.8* 31.9* 25.4*  MCV 66.3* 64.9* 65.5* 66.2* 66.5*  PLT 349 386 381 PLATELET CLUMPS NOTED ON SMEAR, UNABLE TO ESTIMATE 122    Basic Metabolic Panel: Recent Labs  Lab 09/14/19 0416 09/15/19 0424 09/17/19 0716 09/20/19 1146  NA 136 136 137 136  K 3.7 4.0 4.4 3.1*  CL 100 96* 97* 98  CO2 28 27 28 28   GLUCOSE 94 95 95 95  BUN 10 19 22 20   CREATININE 3.18* 4.61* 4.50* 3.32*  CALCIUM 7.4* 7.7* 7.7* 7.5*  PHOS  --  3.5 4.0 2.5    GFR: Estimated Creatinine Clearance: 14.2 mL/min (A) (by C-G formula based on SCr of 3.32 mg/dL (H)).  Liver Function Tests: Recent Labs  Lab 09/15/19 0424 09/17/19 0716 09/20/19 1146  ALBUMIN 1.7* 1.7* 1.9*    CBG: Recent Labs  Lab 09/15/19 0625 09/15/19  [place] 120*     Recent Results (from the past 240 hour(s))  Culture, blood (routine x 2)     Status: None (Preliminary result)   Collection Time: 09/17/19 12:15 AM   Specimen: BLOOD  Result Value Ref Range Status   Specimen Description BLOOD RIGHT ARM  Final   Special Requests   Final    BOTTLES DRAWN AEROBIC ONLY Blood Culture results may not be optimal due to an inadequate volume of blood received in culture bottles   Culture   Final     NO GROWTH 3 DAYS Performed at [facility], [place] [address], [place], [place] [address]    Report Status PENDING  Incomplete  Culture, blood (routine x 2)     Status: None (Preliminary result)   Collection Time: 09/17/19 12:15 AM   Specimen: BLOOD  Result Value Ref Range Status   Specimen Description BLOOD RIGHT HAND  Final   Special Requests   Final    BOTTLES DRAWN AEROBIC ONLY Blood Culture results may not be optimal due to an inadequate volume of blood received in culture bottles   Culture   Final    NO GROWTH 3 DAYS Performed at [facility], [place] [address], [place], [place] [address]    Report Status PENDING  Incomplete         Radiology Studies: No results found.      Scheduled Meds: . amiodarone  200 mg Oral Daily  . vitamin C  500 mg Oral Q24H  . atorvastatin  40 mg Oral q1800  . B-complex with vitamin C  1 tablet Oral Daily  . Chlorhexidine Gluconate Cloth  6 each Topical Q0600  . cholecalciferol  1,000 Units Oral Daily  . cinacalcet  60 mg Oral QHS  . darbepoetin (ARANESP) injection - DIALYSIS  100 mcg Intravenous Q Mon-HD  . docusate sodium  100 mg Oral BID  . feeding supplement (PRO-STAT SUGAR FREE 64)  30 mL Oral TID  . melatonin  3 mg Oral QHS  . midodrine  10 mg Oral TID WC  . sodium chloride flush  10-40 mL Intracatheter Q12H  . sodium chloride flush  3 mL Intravenous Q12H  . sodium chloride flush  3 mL Intravenous Q12H   Continuous Infusions: . sodium chloride Stopped (09/04/19 1236)     LOS: 17 days     Phillips Climes, MD [facility]   09/20/2019, 5:25 PM

## 2019-09-21 LAB — BASIC METABOLIC PANEL
Anion gap: 12 (ref 5–15)
BUN: 15 mg/dL (ref 8–23)
CO2: 27 mmol/L (ref 22–32)
Calcium: 7.8 mg/dL — ABNORMAL LOW (ref 8.9–10.3)
Chloride: 99 mmol/L (ref 98–111)
Creatinine, Ser: 2.64 mg/dL — ABNORMAL HIGH (ref 0.61–1.24)
GFR calc Af Amer: 26 mL/min — ABNORMAL LOW (ref >60)
GFR calc non Af Amer: 23 mL/min — ABNORMAL LOW (ref >60)
Glucose, Bld: 100 mg/dL — ABNORMAL HIGH (ref 70–99)
Potassium: 3.4 mmol/L — ABNORMAL LOW (ref 3.5–5.1)
Sodium: 138 mmol/L (ref 135–145)

## 2019-09-21 LAB — CBC
HCT: 28 % — ABNORMAL LOW (ref 39.0–52.0)
Hemoglobin: 8.8 g/dL — ABNORMAL LOW (ref 13.0–17.0)
MCH: 20.7 pg — ABNORMAL LOW (ref 26.0–34.0)
MCHC: 31.4 g/dL (ref 30.0–36.0)
MCV: 65.9 fL — ABNORMAL LOW (ref 80.0–100.0)
Platelets: 290 10*3/uL (ref 150–400)
RBC: 4.25 MIL/uL (ref 4.22–5.81)
RDW: 24.4 % — ABNORMAL HIGH (ref 11.5–15.5)
WBC: 27.6 10*3/uL — ABNORMAL HIGH (ref 4.0–10.5)
nRBC: 0.1 % (ref 0.0–0.2)

## 2019-09-21 MED ORDER — CLOPIDOGREL BISULFATE 75 MG PO TABS
75.0000 mg | ORAL_TABLET | Freq: Every day | ORAL | Status: DC
  Administered 2019-09-21 – 2019-09-23 (×3): 75 mg via ORAL
  Filled 2019-09-21 (×3): qty 1

## 2019-09-21 MED ORDER — PANTOPRAZOLE SODIUM 40 MG PO TBEC
40.0000 mg | DELAYED_RELEASE_TABLET | Freq: Every day | ORAL | Status: DC
  Administered 2019-09-22 – 2019-09-23 (×2): 40 mg via ORAL
  Filled 2019-09-21 (×2): qty 1

## 2019-09-21 MED ORDER — POTASSIUM CHLORIDE CRYS ER 20 MEQ PO TBCR
20.0000 meq | EXTENDED_RELEASE_TABLET | Freq: Once | ORAL | Status: AC
  Administered 2019-09-21: 20 meq via ORAL
  Filled 2019-09-21: qty 1

## 2019-09-21 NOTE — Progress Notes (Addendum)
Electrophysiology Rounding Note  Patient Name: Joseph GOYER Sr. Date of Encounter: 09/21/2019  Primary Cardiologist: Rozann Lesches, MD Electrophysiologist: New. Has seen Dr. Durenda Hurt in hospital  [place]. is a 79 y.o. male with a hx of ESRD on HD MWF,HTN, PAD, CVA, D-CHF, non-healing wound, AS recently found to be severe with a mean gradient of 47 mmHg, frequent ventricular ectopy, and s/p left AKA for transtibial amputation stump dehiscence.  Subjective   EP asked to see again due to prolonged QTc on amiodarone by tele and EKG  Pt has no new complaints today. Denies lightheadedness, dizziness, palpitations, chest pain, syncope, or near syncope. Somewhat sore from L AKA. He is incontinent of stool and urine. Palliative care has seen as well. Code changed to partial, as he would still like ACLS and CPR, but no intubation/no shocks.   Inpatient Medications    Scheduled Meds: . amiodarone  200 mg Oral Daily  . vitamin C  500 mg Oral Q24H  . atorvastatin  40 mg Oral q1800  . B-complex with vitamin C  1 tablet Oral Daily  . Chlorhexidine Gluconate Cloth  6 each Topical Q0600  . cholecalciferol  1,000 Units Oral Daily  . cinacalcet  60 mg Oral QHS  . darbepoetin (ARANESP) injection - DIALYSIS  100 mcg Intravenous Q Mon-HD  . docusate sodium  100 mg Oral BID  . feeding supplement (PRO-STAT SUGAR FREE 64)  30 mL Oral TID  . melatonin  3 mg Oral QHS  . midodrine  10 mg Oral TID WC  . sodium chloride flush  10-40 mL Intracatheter Q12H  . sodium chloride flush  3 mL Intravenous Q12H  . sodium chloride flush  3 mL Intravenous Q12H   Continuous Infusions: . sodium chloride Stopped (09/04/19 1236)   PRN Meds: sodium chloride, acetaminophen **OR** acetaminophen, albuterol, HYDROmorphone (DILAUDID) injection, metoCLOPramide **OR** metoCLOPramide (REGLAN) injection, oxyCODONE, sodium chloride flush, sodium chloride flush   Vital Signs    Vitals:   09/20/19 2000 09/21/19 0000  09/21/19 0400 09/21/19 1300  BP: (!) 82/43 (!) 88/31 (!) 98/50 (!) 80/50  Pulse: 78 77 80 87  Resp: 19 17 19  (!) 22  Temp: 98.6 F (37 C)  98 F (36.7 C) 98.9 F (37.2 C)  TempSrc: Oral  Oral Oral  SpO2: 95%   97%  Weight:   58 kg   Height:        Intake/Output Summary (Last 24 hours) at 09/21/2019 1415 Last data filed at 09/21/2019 0900 Gross per 24 hour  Intake 180 ml  Output --  Net 180 ml   Filed Weights   09/20/19 0400 09/20/19 1038 09/21/19 0400  Weight: 55.2 kg 53.2 kg 58 kg    Physical Exam    GEN- The patient is chronically ill appearing. alert and oriented x 3 today.   Head- normocephalic, atraumatic Eyes-  Sclera clear, conjunctiva pink Ears- hearing intact Oropharynx- clear Neck- supple Lungs- Clear to ausculation bilaterally, normal work of breathing Heart- Irregular due to frequent ectopy. 2/6 SEM noted.  GI- soft, NT, ND, + BS Extremities- s/p L AKA.  Skin- no rash or lesion Psych- flat affect.   Labs    CBC Recent Labs    09/20/19 1145 09/21/19 0243  WBC 28.2* 27.6*  HGB 8.1* 8.8*  HCT 25.4* 28.0*  MCV 66.5* 65.9*  PLT 321 001   Basic Metabolic Panel Recent Labs    09/20/19 1146 09/20/19 1146 09/20/19 1838 09/21/19 0243  NA 136   < > 137 138  K 3.1*   < > 3.3* 3.4*  CL 98   < > 101 99  CO2 28   < > 27 27  GLUCOSE 95   < > 93 100*  BUN 20   < > 9 15  CREATININE 3.32*   < > 2.24* 2.64*  CALCIUM 7.5*   < > 7.8* 7.8*  MG  --   --  1.7  --   PHOS 2.5  --   --   --    < > = values in this interval not displayed.   Liver Function Tests Recent Labs    09/20/19 1146  ALBUMIN 1.9*   No results for input(s): LIPASE, AMYLASE in the last 72 hours. Cardiac Enzymes No results for input(s): CKTOTAL, CKMB, CKMBINDEX, TROPONINI in the last 72 hours.   Telemetry    NSR in 80s with frequent ectopy including bigeminy and short runs (3-4 beats) of NSVT (personally reviewed)  Radiology    No results found.  Patient Profile     Joseph SMALDONE Sr. is a 79 y.o. male with a hx of ESRD on HD MWF,HTN, PAD, CVA, D-CHF, non-healing wound, AS recently found to be severe with a mean gradient of 47 mmHg, frequent ventricular ectopy, and s/p left AKA for transtibial amputation stump dehiscence.  EP initially asked to see for cardiac clearance due to frequent ectopy; asked to return with prolonged QTc on amiodarone.   Assessment & Plan    1. Ventricular Ectopy BP in 80s at baseline. He would not tolerate beta blocker.  He is currently on amiodarone 200 mg daily (decreased from 200 mg BID) earlier this admission  QTc appears ~530 ms when measured manually. Not candidate for IV amiodarone with soft pressures. Not candidate for ICD with significant co-morbidities including poorly healing wounds and ESRD.  2. Aortic stenosis Severe. Not candidate for TAVR due to his many comorbidities  3. Chronic diastolic CHF Volume status per HD.   4. Hypokalemia 3.4 this am. Difficult to manage in the setting of his dialysis.  He receives K bath(s) with his HD per Nephrology notes. Discussed with primary and will gently supp 20 meq K and follow.   5. ESRD HD M/W/F  6. Code Status He is a partial code.  Yes CPR/ACLS NO intubation/NO shocks  Will discuss decreasing/stopping amiodarone with Dr. Rayann Heman. Would continue palliative care discussions which were visited earlier this admission. He is not a candidate for ICD, and has said he would not want intubation or shock were he to have cardiac arrest.   ADDENDUM: Discussed with Dr. Rayann Heman. He is relatively asymptomatic with his ectopy, and not candidate for any procedures or devices. At this point, with prolonged QTc, would stop amiodarone. Pt not candidate for BB with soft pressures at baseline on HD.   For questions or updates, please contact [place] Please consult [email or web address] for contact info under Cardiology/STEMI.  Signed, Shirley Friar, PA-C  09/21/2019, 2:15 PM     I have seen, examined the patient, and reviewed the above assessment and plan.  Changes to above are made where necessary.  On exam, RRR.  Elderly and frail.  Qt prolongation is noted, without sustained arrhythmias.  Stop amiodarone.  Electrophysiology team to see as needed while here. Please call with questions.   Co Sign: Thompson Grayer, MD 09/21/2019 10:52 PM

## 2019-09-21 NOTE — Progress Notes (Signed)
Renal Navigator updated to patient's OP HD clinic/[facility] to provide continuity of care.  Alphonzo Cruise, [place] Renal Navigator [phone]

## 2019-09-21 NOTE — Progress Notes (Addendum)
Occupational Therapy Treatment Patient Details Name: Joseph CRUEY Sr. MRN: [id number] DOB: [birth year] Today's Date: 09/21/2019    History of present illness Pt is a 79 y/o male now s/p L transfemoral amputation on 5/12. On admission, pt incidentally found to have rectal bleeding. PMH including but not limited to R LE transmetatarsal amp, HTN, arthritis, CVA with residual L sided weakness, arthritis, anxiety, ESRD M/W/F, recent fall with R hip avulasion fx WBAT with no medical mgmt.  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   OT comments  Patient supine in bed on arrival.  He was feeling fatigued and apprehensive to try sitting up.  Patient agreed to complete UE theraband exercises.  Educated on the importance of building UB strength to improve functional mobility.  PT assisted with mobility.  Patient able to sit up with mod assist and stand using stedy with max x 2.  Patient had bowel movement while standing and required total assist to clean.  He did have sacral pressure wound that was causing him pain.  Sacral pad changed and patient left in R sidelying.  Will continue to follow with OT acutely to address the deficits listed below.      Follow Up Recommendations  SNF;Supervision/Assistance - 24 hour    Equipment Recommendations  Other (comment)    Recommendations for Other Services      Precautions / Restrictions Precautions Precautions: Fall;Other (comment) Precaution Comments: R TMA, new L transfemoral amputation Restrictions Weight Bearing Restrictions: Yes RLE Weight Bearing: Weight bearing as tolerated LLE Weight Bearing: Non weight bearing       Mobility Bed Mobility Overal bed mobility: Needs Assistance Bed Mobility: Supine to Sit;Sit to Sidelying     Supine to sit: Mod assist;+2 for safety/equipment   Sit to sidelying: Mod assist    Transfers Overall transfer level: Needs assistance   Transfers: Sit to/from Stand Sit to  Stand: Max assist;+2 physical assistance;+2 safety/equipment              Balance Overall balance assessment: Needs assistance Sitting-balance support: Feet supported Sitting balance-Leahy Scale: Fair     Standing balance support: Bilateral upper extremity supported;During functional activity Standing balance-Leahy Scale: Poor                             ADL either performed or assessed with clinical judgement   ADL Overall ADL's : Needs assistance/impaired             Lower Body Bathing: Total assistance;Sit to/from stand               Toileting- Water quality scientist and Hygiene: Total assistance;Sit to/from stand       Functional mobility during ADLs: Maximal assistance;+2 for physical assistance General ADL Comments: Patient sat EOB for 10 min, starting at max assist to maintain balance and improving to min guard     Vision       Perception     Praxis      Cognition Arousal/Alertness: Awake/alert Behavior During Therapy: WFL for tasks assessed/performed;Flat affect Overall Cognitive Status: No family/caregiver present to determine baseline cognitive functioning                                 General Comments: Patient generally flat and slow processing        Exercises Exercises: General Upper Extremity General  Exercises - Upper Extremity Shoulder Flexion: AROM;Strengthening;Both;10 reps;Theraband Theraband Level (Shoulder Flexion): Level 1 (Yellow) Shoulder ABduction: AROM;Strengthening;Both;10 reps;Theraband Theraband Level (Shoulder Abduction): Level 1 (Yellow) Shoulder Horizontal ABduction: AROM;Strengthening;Both;10 reps;Theraband Theraband Level (Shoulder Horizontal Abduction): Level 1 (Yellow) Elbow Flexion: AROM;Strengthening;Both;10 reps;Theraband Theraband Level (Elbow Flexion): Level 1 (Yellow)   Shoulder Instructions       General Comments BP low, 86/46 at start.  Taken multiple times, all near same.   Patient on RA and SpO2 decreased to 84 after 2 min stand, increase back to high 90s shortly after sitting with pursed lip breathing and rest     Pertinent Vitals/ Pain       Pain Assessment: Faces Faces Pain Scale: Hurts little more Pain Location: buttocks and L LE Pain Descriptors / Indicators: Grimacing;Discomfort Pain Intervention(s): Monitored during session;Repositioned  Home Living                                          Prior Functioning/Environment              Frequency  Min 2X/week        Progress Toward Goals  OT Goals(current goals can now be found in the care plan section)  Progress towards OT goals: Progressing toward goals  Acute Rehab OT Goals Patient Stated Goal: get stronger OT Goal Formulation: With patient Time For Goal Achievement: 09/28/19 Potential to Achieve Goals: Good  Plan Discharge plan remains appropriate    Co-evaluation    PT/OT/SLP Co-Evaluation/Treatment: Yes Reason for Co-Treatment: For patient/therapist safety;To address functional/ADL transfers   OT goals addressed during session: ADL's and self-care;Strengthening/ROM      AM-PAC OT "6 Clicks" Daily Activity     Outcome Measure   Help from another person eating meals?: A Little Help from another person taking care of personal grooming?: A Lot Help from another person toileting, which includes using toliet, bedpan, or urinal?: A Lot Help from another person bathing (including washing, rinsing, drying)?: A Lot Help from another person to put on and taking off regular upper body clothing?: A Lot Help from another person to put on and taking off regular lower body clothing?: A Lot 6 Click Score: 13    End of Session Equipment Utilized During Treatment: Gait belt;Other (comment)(stedy)  OT Visit Diagnosis: Other abnormalities of gait and mobility (R26.89);Muscle weakness (generalized) (M62.81)   Activity Tolerance Patient tolerated treatment well    Patient Left in bed;with call bell/phone within reach   Nurse Communication Mobility status        Time: 2992-4268 OT Time Calculation (min): 47 min  Charges: OT General Charges $OT Visit: 1 Visit OT Treatments $Self Care/Home Management : 8-22 mins $Therapeutic Exercise: 8-22 mins  August Luz, OTR/L    Phylliss Bob 09/21/2019, 1:48 PM

## 2019-09-21 NOTE — Progress Notes (Addendum)
Physical Therapy Treatment Patient Details Name: Joseph IDEN Sr. MRN: [id number] DOB: 09/27/1940 Today's Date: 09/21/2019    History of Present Illness Pt is a 79 y/o male now s/p L transfemoral amputation on 5/12. On admission (on 09/03/19), pt incidentally found to have rectal bleeding. PMH including but not limited to R LE transmetatarsal amp, HTN, arthritis, CVA with residual L sided weakness, arthritis, anxiety, ESRD M/W/F, recent fall with R hip avulasion fx WBAT with no medical mgmt.  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    PT Comments    Goals updated.  Co-session with OT focusing on EOB activity and sit to stand transitions which can only be done with two person assist and standing frame.  Pt has an evolving sacral wound which he reported bothered him in sitting and with scooting.  Peri care completed due to [facility] and new sacral foam applied.  Pt remains appropriate for post acute SNF level rehab at discharge.  PT will continue to follow acutely for safe mobility progression.   Follow Up Recommendations  SNF     Equipment Recommendations  Wheelchair (measurements PT);Wheelchair cushion (measurements PT);Hospital bed    Recommendations for Other Services   NA     Precautions / Restrictions Precautions Precautions: Fall;Other (comment) Precaution Comments: R TMA, new L transfemoral amputation Restrictions Weight Bearing Restrictions: Yes RLE Weight Bearing: Weight bearing as tolerated LLE Weight Bearing: Non weight bearing    Mobility  Bed Mobility Overal bed mobility: Needs Assistance Bed Mobility: Supine to Sit;Sit to Sidelying     Supine to sit: Mod assist;+2 for safety/equipment;HOB elevated   Sit to sidelying: Mod assist;HOB elevated General bed mobility comments: Mod assist to support trunk and assist in weight shifting hips to scoot EOB.  Pt with heavy use of bed rail, assist needed to support trunk and lift right leg back  into bed to return to supine.   Transfers Overall transfer level: Needs assistance   Transfers: Sit to/from Stand Sit to Stand: Max assist;+2 physical assistance;+2 safety/equipment         General transfer comment: Used the stedy standing frame to come up to standing from elevated bed.  Assist to support trunk, pt flexed at hips with difficulty coming to fully upright extended posture.  Stood x 2 for peri care for ~1-2 mins.    Ambulation/Gait                        Balance Overall balance assessment: Needs assistance Sitting-balance support: Feet supported Sitting balance-Leahy Scale: Fair     Standing balance support: Bilateral upper extremity supported;During functional activity Standing balance-Leahy Scale: Poor Standing balance comment: mod to max assist in standing with two therapist supporte and standing frame blocking out right knee.                             Cognition Arousal/Alertness: Awake/alert Behavior During Therapy: WFL for tasks assessed/performed;Flat affect Overall Cognitive Status: No family/caregiver present to determine baseline cognitive functioning                                 General Comments: Patient generally flat and slow processing      Exercises      General Comments General comments (skin integrity, edema, etc.): BP low, 86/46 at start.  Taken  multiple times, all near same.  Patient on RA and SpO2 decreased to 84 after 2 min stand, increase back to high 90s shortly after sitting with pursed lip breathing and rest.  Pt did not report lightheadedness, but was slower to process after two stands and seemingly a bit more lethargic until returned to supine.       Pertinent Vitals/Pain Pain Assessment: Faces Faces Pain Scale: Hurts little more Pain Location: buttocks and L LE Pain Descriptors / Indicators: Grimacing;Discomfort Pain Intervention(s): Limited activity within patient's tolerance;Monitored  during session;Repositioned           PT Goals (current goals can now be found in the care plan section) Acute Rehab PT Goals Patient Stated Goal: get stronger PT Goal Formulation: With patient Time For Goal Achievement: Oct 11, 2019 Potential to Achieve Goals: Good Progress towards PT goals: Progressing toward goals    Frequency    Min 2X/week      PT Plan Current plan remains appropriate    Co-evaluation PT/OT/SLP Co-Evaluation/Treatment: Yes Reason for Co-Treatment: Complexity of the patient's impairments (multi-system involvement);For patient/therapist safety;To address functional/ADL transfers PT goals addressed during session: Mobility/safety with mobility;Balance;Proper use of DME;Strengthening/ROM        AM-PAC PT "6 Clicks" Mobility   Outcome Measure  Help needed turning from your back to your side while in a flat bed without using bedrails?: A Lot Help needed moving from lying on your back to sitting on the side of a flat bed without using bedrails?: A Lot Help needed moving to and from a bed to a chair (including a wheelchair)?: Total Help needed standing up from a chair using your arms (e.g., wheelchair or bedside chair)?: Total Help needed to walk in hospital room?: Total Help needed climbing 3-5 steps with a railing? : Total 6 Click Score: 8    End of Session Equipment Utilized During Treatment: Gait belt Activity Tolerance: Patient limited by pain;Patient limited by fatigue Patient left: in bed;with call bell/phone within reach(in side lying for relief of pressure on sacrum)   PT Visit Diagnosis: Other abnormalities of gait and mobility (R26.89);Muscle weakness (generalized) (M62.81);Pain Pain - Right/Left: Left Pain - part of body: Leg     Time: 8022-3361 PT Time Calculation (min) (ACUTE ONLY): 30 min  Charges:  $Therapeutic Activity: 8-22 mins                     Verdene Lennert, PT, DPT  Acute Rehabilitation [phone] pager [phone]) [phone]  office

## 2019-09-21 NOTE — Progress Notes (Signed)
[facility] Progress Note   Dialysis Orders: [facility]- MWF- 4 hours- Thigh AVG- BFR 400/DFR800- 2 K bath as OP- EDW 62. Gets heparin per tx- 2000 units per hour, epo 4000 and sensipar 60 mg. Last hgb there was 4/26 9.9  Assessment/ Plan:   1. GIB- EGD w/duodenal bulb ulcer. Gastric bx without malignancy or H. pylori. PPI per primary team. Hemoglobin relatively stable at this time 2. 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 Wound VAC off 3. Ventricular ectopy- asymptomatic. On amiodarone orally 4. ESRD- On HD MWF.Tolerating dialysis well with no issues. Continue to per his home schedule.Absolutely needs midodrine before HD.  HD tomorrow; MWF outpt.  Discussed continuation of dialysis - he is quite frail; he wishes to continue.   Dialysis Access: Left lower extremity thigh graft unable to access because of wound VACand 5/20 no bruit.  Dr. Augustin Coupe d/w Dr. Carlis Abbott [facility] - plan to wait 2-4 weeks to attempt declot ( would like to let the wound heal fully before attempting a declot. In addition some blood seepage bet staples so another reason not to perform declot immediately).  Current access is REJ TDC placed 5/20  6. Hypotension/volume-no signs of significant volume overload on midodrine TID (he says LUE edema always present).  7. Anemiaof CKD-Hgb stable in 8-9s s/p 1unit pRBC during admission. Status post iron for low T sat of 11%. Continue ESA. 8. Secondary Hyperparathyroidism -calcium and phosphorus acceptable. Continue sensipar- does not get vit D with HD-phosphorus (4 5/21)  on binder)Continue to monitor 9. Nutrition- Renal diet w/fluid restrictions  Subjective:   Poor appetite, denies n/v/dyspnea.  Says no new issues.  Doesn't think L arm swelling any different than usual.  Tolerated HD fine yesterday.    Objective:   BP (!) 98/50   Pulse 80   Temp 98 F (36.7 C) (Oral)   Resp 19   Ht 5' 5.98" (1.676 m)    Wt 58 kg   SpO2 95%   BMI 20.65 kg/m   Intake/Output Summary (Last 24 hours) at 09/21/2019 0805 Last data filed at 09/20/2019 2200 Gross per 24 hour  Intake 60 ml  Output -200 ml  Net 260 ml   Weight change: -2 kg  Physical Exam: General:chronically ill appearing, frail male in NAD, lying flat in bed  Heart:RRR, +8/3 systolic murmur Lungs:CTA anteriolaterally, bilateral chest rise Abdomen:soft, NTND Extremities:L AKA with staples, rt TMA; LUE 2+ pitting edema he says is chronic Dialysis Access: L thigh AVGNObruit , wound vac out now w/ staples; dressing with small amount of bloody drainage noted today.   REJ TC  Imaging: No results found.  Labs: BMET Recent Labs  Lab 09/15/19 0424 09/17/19 0716 09/20/19 1146 09/20/19 1838 09/21/19 0243  NA 136 137 136 137 138  K 4.0 4.4 3.1* 3.3* 3.4*  CL 96* 97* 98 101 99  CO2 27 28 28 27 27   GLUCOSE 95 95 95 93 100*  BUN 19 22 20 9 15   CREATININE 4.61* 4.50* 3.32* 2.24* 2.64*  CALCIUM 7.7* 7.7* 7.5* 7.8* 7.8*  PHOS 3.5 4.0 2.5  --   --    CBC Recent Labs  Lab 09/15/19 0424 09/17/19 0015 09/17/19 0525 09/19/19 0855 09/20/19 1145 09/21/19 0243  WBC 22.9*   < > 23.3* 28.0* 28.2* 27.6*  NEUTROABS 17.3*  --   --   --   --   --   HGB 8.0*   < > 8.6* 9.9* 8.1* 8.8*  HCT  25.4*   < > 26.8* 31.9* 25.4* 28.0*  MCV 66.3*   < > 65.5* 66.2* 66.5* 65.9*  PLT 349   < > 381 PLATELET CLUMPS NOTED ON SMEAR, UNABLE TO ESTIMATE 321 290   < > = values in this interval not displayed.    Medications:    . amiodarone  200 mg Oral Daily  . vitamin C  500 mg Oral Q24H  . atorvastatin  40 mg Oral q1800  . B-complex with vitamin C  1 tablet Oral Daily  . Chlorhexidine Gluconate Cloth  6 each Topical Q0600  . cholecalciferol  1,000 Units Oral Daily  . cinacalcet  60 mg Oral QHS  . darbepoetin (ARANESP) injection - DIALYSIS  100 mcg Intravenous Q Mon-HD  . docusate sodium  100 mg Oral BID  . feeding supplement (PRO-STAT SUGAR FREE 64)   30 mL Oral TID  . melatonin  3 mg Oral QHS  . midodrine  10 mg Oral TID WC  . sodium chloride flush  10-40 mL Intracatheter Q12H  . sodium chloride flush  3 mL Intravenous Q12H  . sodium chloride flush  3 mL Intravenous Q12H    Jannifer Hick MD [facility] Pager [phone]

## 2019-09-21 NOTE — Progress Notes (Signed)
PROGRESS NOTE    [name]  VEL:[id number] DOB: August 09, 1940 DOA: 09/03/2019 PCP: [name], MD    No chief complaint on file.   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 However in the preoperative area, patient noted to have rectal bleeding for which [facility] was consulted and advised that the procedure will need to be postponed.  Patient was subsequently admitted.  Endoscopy was significant for duodenal ulcer, bleeding has resolved, and he had his left AKA done by Dr. Sharol Given 5/12, his hospital course was complicated by extreme frailty, arrhythmias, prolonged QTC, please see discussion below  Subjective: Denies any chest pain, shortness of breath, he reports generalized weakness and fatigue, appetite remains poor.  Assessment & Plan:   Principal Problem:   GI bleeding Active Problems:   Encounter for central line placement   ESRD on dialysis [facility])   Aortic stenosis   PVD (peripheral vascular disease) ([place])   Essential hypertension   Duodenal ulcer   Palliative care encounter   DNR (do not resuscitate) discussion   Goals of care,  counseling/discussion   Dehiscence of amputation stump ([place])   Chronic hypotension   SVT (supraventricular tachycardia) ([facility])   Ventricular ectopy  Upper GI bleed secondary to duodenal ulcer/acute blood loss anemia Patient noted to have some rectal bleeding in the preop area prior to surgery and subsequently admitted.  Patient transfused a unit of packed red blood cells 09/04/2019.  Plavix currently on hold.  Status post upper endoscopy 09/04/2019 that showed duodenal ulcer which was likely cause of patient's bleeding per GI.  Colonoscopy subsequently was not performed as patient also had a poor prep. -Continue to monitor hemoglobin and transfuse as needed, so far hemoglobin remained stable H&H stable at 7.8.  Continue PPI every 12 hours.  Continue to hold Overall hemoglobin remained stable, will continue to monitor closely. -Resumed back on Plavix.  End-stage renal disease on hemodialysis - Patient noted per nephrology that dressing from wound VAC currently over patient's AV graft. (And currently wound dressing). -IR has been consulted for tunnel dialysis dialysis catheter placement(which has postponed initially given markedly elevated white blood cell, but patient appears nontoxic, with chronic leukocytosis, with no evidence of acute infection, he had his tunneled catheter placed 5/20. -Hemodialysis management per renal.  Progressive gangrenous necrotic changes with stump dehiscence of left transtibial amputation site - Patient seen by orthopedics.  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  Patient initially with wound VAC, has been seen by orthopedic where it has been discontinued . currently on Prevena boot.  Outpatient follow-up with orthopedics.  Per orthopedics. -Hold blood at AKA site, so we will hold Plavix for 48 hours.  Leukocytosis Likely secondary to problem #3.  Leukocytosis was fluctuating but slowly started to trend down. Status post left AKA.   Patient started on IV Zyvox which is subsequently been discontinued..  Repeat  blood cultures with no growth to date.  Due to worsening leukocytosis ID consulted who recommended discontinuation of antibiotics, MRI of the right foot, and consideration for CT chest abdomen and pelvis if worsening WBC and no cause found.  ID have signed off.   -He still having some significant leukocytosis but  he is nontoxic-appearing, this appears to be somehow chronic over now for 2 months  Chronic diastolic heart failure Compensated.  On hemodialysis.  Per nephrology.  Chronic hypotension Midodrine.    SVT Patient noted to have a run of SVT with heart rates as high as the 200s in the PACU postoperatively on 09/08/2019.  Patient received a dose of IV metoprolol and SVT broken and heart rate in the 70s.  Patient started on amiodarone and dose increased to 200 mg daily per cardiology for ventricular ectopy.  Outpatient follow-up with cardiology.    Ventricular ectopy Patient started on amiodarone per cardiology.  Amiodarone dose increased to 200 mg daily per cardiology.  Outpatient follow-up with cardiology.  Follow. -Remains with significant ectopy, he is on bigeminy today despite being on amiodarone.  Severe Aortic stenosis Noted on TEE during previous admission.  Patient noted to not be a candidate for TAVR.  Avoid hypotension.  Cardiology was following but have signed off.    Prolonged QTC EKG showing prolonged QTC at 580 today, stopped his Zofran, he is on amiodarone for frequent ventricular ectopy, I have discussed with cardiology, they did recommend EP evaluation, so I will await for further recommendations . -Meanwhile continue with telemetry monitoring  Iron deficiency anemia/anemia of chronic disease -Continue to monitor closely and transfuse as needed   History of CVA Continue with Plavix  Generalized deconditioning Patient overall with a poor prognosis.  Palliative care has assessed and followed  patient.  Patient now with partial code.  Patient wishes to have CPR and ACLS medication but no intubation or shock.  PT/OT.  Needs SNF.    Sacral pressure ulcer -Wound care Pressure Injury 09/05/19 Sacrum Circumferential Stage 2 -  Partial thickness loss of dermis presenting as a shallow open injury with a red, pink wound bed without slough. This wound was documented on previous admission in April (Active)  09/05/19 2000  Location: Sacrum  Location Orientation: Circumferential  Staging: Stage 2 -  Partial thickness loss of dermis presenting as a shallow open injury with a red, pink wound bed without slough.  Wound Description (Comments): This wound was documented on previous admission in April  Present on Admission: Yes        DVT prophylaxis: Recent GI bleed.  Avoiding chemical prophylaxis.  SCDs. Code Status: Partial Family Communication: Discussed with daughter via phone Disposition:   Status is: Inpatient    Dispo: The patient is from: SNF              Anticipated d/c is to: SNF              Anticipated d/c date is: 1 days -This discharge to SNF facility has been canceled given frequent ectopy, and awaiting EP evaluation     Consultants:   Gastroenterology: Dr. Tarri Glenn 09/03/2019  Nephrology: Dr.Schertz 09/03/2019  Palliative care: Lindell Spar, NP 09/04/2019  Cardiology: Dr. Lovena Le 09/04/2019  Domenic Moras, FNP 09/07/2019  Orthopedics: Dr. Sharol Given  ID: Dr. Tommy Medal 09/10/2019  Procedures:   Chest x-ray 09/03/2019  Upper endoscopy 09/04/2019--per Dr. Tarri Glenn  Left above-the-knee amputation per Dr. Sharol Given 09/08/2019 EGD on 09/04/2019 Impression: - Duodenal ulcer may be the  cause of recent overt  GI bleeding. 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 Biopsied. - Erosive gastropathy with no bleeding and no  stigmata of recent bleeding.  Biopsied. - A single non-bleeding angioectasia in the  duodenum. - Small hiatal hernia. - Unable to proceed with colonoscopy as planned due  to incomplete preparation. Patient had formed stool  on last bowel movement. Recommendation: - Return patient to hospital ward for ongoing care. - Clear liquid diet. Advance as tolerated. - No aspirin, ibuprofen, naproxen, or other  non-steroidal anti-inflammatory drugs. - Patient has a prolonged QT. Discussed with Dr.  Starla Link who agreed with trial of pantoprazole 40 mg  IV BID with close monitoring of QTc. - Will defer colonoscopy at this time given the  challenges that the patient has prepping for the  procedure. Duodenal ulcer may be the source of  bleeding. - Continue serial hgb/hct with transfusion as  indicated.  Exchange of left IJ central line for triple-lumen HD catheter--per IR Dr. Laurence Ferrari 09/10/2019  MRI right foot 09/10/2019  Antimicrobials:   IV Ancef 09/08/2019 preoperatively  IV Zyvox 09/08/2019>>>>> 09/10/2019    Objective: Vitals:   09/20/19 2000 09/21/19 0000 09/21/19 0400 09/21/19 1300  BP: (!) 82/43 (!) 88/31 (!) 98/50 (!) 80/50  Pulse: 78 77 80 87  Resp: 19 17 19  (!) 22  Temp: 98.6 F (37 C)  98 F (36.7 C) 98.9 F (37.2 C)  TempSrc: Oral  Oral Oral  SpO2: 95%   97%  Weight:   58 kg   Height:        Intake/Output Summary (Last 24 hours) at 09/21/2019 1429 Last data filed at 09/21/2019 0900 Gross per 24 hour  Intake 180 ml  Output --  Net  180 ml   Filed Weights   09/20/19 0400 09/20/19 1038 09/21/19 0400  Weight: 55.2 kg 53.2 kg 58 kg    Examination:  Awake Alert, Oriented X 3, No new F.N deficits, Normal affect Rales, chronically ill-appearing. Symmetrical Chest wall movement, Good air movement bilaterally, CTAB RRR,No Gallops,Rubs or new Murmurs, No Parasternal Heave +ve B.Sounds, Abd Soft, No tenderness, No rebound - guarding or rigidity. No Cyanosis, status post left AKA, with small amount of blood can be seen through gauze, right foot with transmetatarsal amputation.  Sacral pressure ulcer.       Data Reviewed: I have personally reviewed following labs and imaging studies  CBC: Recent Labs  Lab 09/15/19 0424 09/15/19 0424 09/17/19 0015 09/17/19 0525 09/19/19 0855 09/20/19 1145 09/21/19 0243  WBC 22.9*   < > 24.5* 23.3* 28.0* 28.2* 27.6*  NEUTROABS 17.3*  --   --   --   --   --   --   HGB 8.0*   < > 10.0* 8.6* 9.9* 8.1* 8.8*  HCT 25.4*   < > 31.3* 26.8* 31.9* 25.4* 28.0*  MCV 66.3*   < > 64.9* 65.5* 66.2* 66.5* 65.9*  PLT 349   < > 386 381 PLATELET CLUMPS NOTED ON SMEAR, UNABLE TO ESTIMATE 321 290   < > = values in this interval not displayed.    Basic Metabolic Panel: Recent Labs  Lab 09/15/19 0424 09/17/19 0716 09/20/19 1146 09/20/19 1838 09/21/19 0243  NA 136 137 136 137 138  K 4.0 4.4 3.1* 3.3* 3.4*  CL 96* 97* 98 101 99  CO2 27 28 28 27 27   GLUCOSE 95 95 95 93 100*  BUN 19 22 20 9 15   CREATININE 4.61* 4.50* 3.32* 2.24* 2.64*  CALCIUM 7.7*  7.7* 7.5* 7.8* 7.8*  MG  --   --   --  1.7  --   PHOS 3.5 4.0 2.5  --   --     GFR: Estimated Creatinine Clearance: 19.5 mL/min (A) (by C-G formula based on SCr of 2.64 mg/dL (H)).  Liver Function Tests: Recent Labs  Lab 09/15/19 0424 09/17/19 0716 09/20/19 1146  ALBUMIN 1.7* 1.7* 1.9*    CBG: Recent Labs  Lab 09/15/19 0625 09/15/19 0641  GLUCAP 96 120*     Recent Results (from the past 240 hour(s))  Culture, blood (routine x  2)     Status: None (Preliminary result)   Collection Time: 09/17/19 12:15 AM   Specimen: BLOOD  Result Value Ref Range Status   Specimen Description BLOOD RIGHT ARM  Final   Special Requests   Final    BOTTLES DRAWN AEROBIC ONLY Blood Culture results may not be optimal due to an inadequate volume of blood received in culture bottles   Culture   Final    NO GROWTH 4 DAYS Performed at [facility], [place] [address] [place], [place] [address]    Report Status PENDING  Incomplete  Culture, blood (routine x 2)     Status: None (Preliminary result)   Collection Time: 09/17/19 12:15 AM   Specimen: BLOOD  Result Value Ref Range Status   Specimen Description BLOOD RIGHT HAND  Final   Special Requests   Final    BOTTLES DRAWN AEROBIC ONLY Blood Culture results may not be optimal due to an inadequate volume of blood received in culture bottles   Culture   Final    NO GROWTH 4 DAYS Performed at [facility], [place] [address], [place], [place] [address]    Report Status PENDING  Incomplete         Radiology Studies: No results found.      Scheduled Meds: . amiodarone  200 mg Oral Daily  . vitamin C  500 mg Oral Q24H  . atorvastatin  40 mg Oral q1800  . B-complex with vitamin C  1 tablet Oral Daily  . Chlorhexidine Gluconate Cloth  6 each Topical Q0600  . cholecalciferol  1,000 Units Oral Daily  . cinacalcet  60 mg Oral QHS  . darbepoetin (ARANESP) injection - DIALYSIS  100 mcg Intravenous Q Mon-HD  . docusate sodium  100 mg Oral BID  . feeding supplement (PRO-STAT SUGAR FREE 64)  30 mL Oral TID  . melatonin  3 mg Oral QHS  . midodrine  10 mg Oral TID WC  . sodium chloride flush  10-40 mL Intracatheter Q12H  . sodium chloride flush  3 mL Intravenous Q12H  . sodium chloride flush  3 mL Intravenous Q12H   Continuous Infusions: . sodium chloride Stopped (09/04/19 1236)     LOS: 18 days     Phillips Climes, MD [facility]   09/21/2019, 2:29 PM

## 2019-09-22 DIAGNOSIS — Z7189 Other specified counseling: Secondary | ICD-10-CM

## 2019-09-22 LAB — CBC
HCT: 25.1 % — ABNORMAL LOW (ref 39.0–52.0)
Hemoglobin: 7.9 g/dL — ABNORMAL LOW (ref 13.0–17.0)
MCH: 21.2 pg — ABNORMAL LOW (ref 26.0–34.0)
MCHC: 31.5 g/dL (ref 30.0–36.0)
MCV: 67.5 fL — ABNORMAL LOW (ref 80.0–100.0)
Platelets: 329 10*3/uL (ref 150–400)
RBC: 3.72 MIL/uL — ABNORMAL LOW (ref 4.22–5.81)
RDW: 23.8 % — ABNORMAL HIGH (ref 11.5–15.5)
WBC: 29.3 10*3/uL — ABNORMAL HIGH (ref 4.0–10.5)
nRBC: 0 % (ref 0.0–0.2)

## 2019-09-22 LAB — RENAL FUNCTION PANEL
Albumin: 2.2 g/dL — ABNORMAL LOW (ref 3.5–5.0)
Anion gap: 10 (ref 5–15)
BUN: 19 mg/dL (ref 8–23)
CO2: 27 mmol/L (ref 22–32)
Calcium: 7.5 mg/dL — ABNORMAL LOW (ref 8.9–10.3)
Chloride: 100 mmol/L (ref 98–111)
Creatinine, Ser: 2.83 mg/dL — ABNORMAL HIGH (ref 0.61–1.24)
GFR calc Af Amer: 24 mL/min — ABNORMAL LOW (ref >60)
GFR calc non Af Amer: 21 mL/min — ABNORMAL LOW (ref >60)
Glucose, Bld: 106 mg/dL — ABNORMAL HIGH (ref 70–99)
Phosphorus: 1.9 mg/dL — ABNORMAL LOW (ref 2.5–4.6)
Potassium: 3.4 mmol/L — ABNORMAL LOW (ref 3.5–5.1)
Sodium: 137 mmol/L (ref 135–145)

## 2019-09-22 LAB — CULTURE, BLOOD (ROUTINE X 2)
Culture: NO GROWTH
Culture: NO GROWTH

## 2019-09-22 LAB — MAGNESIUM: Magnesium: 1.8 mg/dL (ref 1.7–2.4)

## 2019-09-22 MED ORDER — MAGNESIUM SULFATE 2 GM/50ML IV SOLN
2.0000 g | Freq: Once | INTRAVENOUS | Status: AC
  Administered 2019-09-22: 2 g via INTRAVENOUS
  Filled 2019-09-22: qty 50

## 2019-09-22 MED ORDER — DARBEPOETIN ALFA 150 MCG/0.3ML IJ SOSY: 150.0000 ug | PREFILLED_SYRINGE | INTRAMUSCULAR | Status: DC

## 2019-09-22 MED ORDER — ALBUMIN HUMAN 25 % IV SOLN
INTRAVENOUS | Status: AC
  Administered 2019-09-22: 50 g via ARTERIOVENOUS_FISTULA
  Filled 2019-09-22: qty 100

## 2019-09-22 NOTE — Progress Notes (Signed)
PROGRESS NOTE    [name]  FBP:[id number] DOB: [birth year] DOA: 09/03/2019 PCP: [name], MD    No chief complaint on file.   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  Patient developed progressive gangrenous necrotic changes with left transtibial amputation stump dehiscence, evaluated outpatient by orthopedics and presented to [facility] on 09/03/2019 for above-knee amputation. However in the preoperative area, patient noted to have rectal bleeding for which [facility] was consulted and advised that the procedure will need to be postponed.  Patient was subsequently admitted.  Endoscopy was significant for duodenal ulcer, bleeding has resolved, and he had his left AKA done by Dr. Sharol Given 5/12, his hospital course was complicated by extreme frailty, arrhythmias, prolonged QTC, please see discussion below  Subjective: Denies any chest pain or shortness of breath.  Assessment & Plan: Upper GI bleed secondary to duodenal ulcer/acute blood loss anemia: Resolved-hemoglobin now stable.  Did require a unit of PRBC transfusion.  Plavix was initially held-but now has been resumed.  Patient is s/p EGD on 5/8 which showed a duodenal ulcer-colonoscopy was not performed as patient had a poor prep.  Continue to follow CBC periodically.  End-stage renal disease on  hemodialysis: Management per nephrology.  Had tunneled HD catheter placed on 5/20-as unable to access left lower extremity thigh graft due to wound VAC.  Vascular surgery plans to wait 2-4 weeks to attempt to declot.  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  Will need outpatient follow-up by orthopedics.  Leukocytosis: Thought to be reactive secondary to chronic inflammation related to stump dehiscence-blood cultures on 5/14 and 5/21 negative.  He appears nontoxic-currently being monitored off IV antibiotics.   Chronic diastolic heart failure: Volume status stable-diuresis with HD.  Chronic hypotension: Continue with midodrine.    OEU:[id number] noted to have a run of SVT with heart rates as high as the 200s in the PACU postoperatively on 09/08/2019.  Patient received a dose of IV metoprolol and SVT broken and heart rate in the 70s.  Patient started on amiodarone and dose increased to 200 mg daily per cardiology for ventricular ectopy.  Outpatient follow-up with cardiology.    Ventricular ectopy: Previously on amiodarone-but due to prolonged QTC-this was discontinued by cardiology.    Severe Aortic stenosis:Noted on TEE during previous admission.  Patient noted to not be a candidate for TAVR.  Avoid hypotension.  Cardiology was following but have signed off.   Prolonged QTC: Previous MD discussed with cardiology-subsequently EP evaluation completed-amiodarone discontinued.  Avoid QTC prolonging agents.  Continue telemetry monitoring.  Iron deficiency anemia/anemia of chronic disease:Continue to monitor closely and transfuse as needed   History of ERX:[id number] with Plavix  Generalized deconditioning Patient overall with a poor prognosis.  Palliative care has assessed and followed patient.  Patient now with partial code.  Patient wishes to have CPR and ACLS medication but no intubation or shock.  PT/OT.  Needs SNF.     Sacral pressure ulcer -Wound care  Pressure Injury 09/05/19 Sacrum Circumferential Stage 2 -  Partial thickness loss of dermis presenting as a shallow open injury with a red, pink wound bed without slough. This wound was documented on previous admission in April (Active)  09/05/19 2000  Location: Sacrum  Location Orientation: Circumferential  Staging: Stage 2 -  Partial thickness loss of dermis presenting as a shallow open injury with a red, pink wound bed without slough.  Wound Description (Comments): This wound was documented on previous admission in April  Present on Admission: Yes    DVT prophylaxis: Recent GI bleed.  Avoiding chemical prophylaxis.  SCDs. Code Status: Partial Family Communication: None at bedside-we will address over the next few days. Disposition:   Status is: Inpatient   Dispo: The patient is from: SNF              Anticipated d/c is to: SNF              Anticipated d/c date is: 1 days              Medically stable for discharge when SNF bed available    Consultants:   Gastroenterology: Dr. Tarri Glenn 09/03/2019  Nephrology: Dr.Schertz 09/03/2019  Palliative care: Lindell Spar, NP 09/04/2019  Cardiology: Dr. Lovena Le 09/04/2019  Domenic Moras, FNP 09/07/2019  Orthopedics: Dr. Sharol Given  ID: Dr. Tommy Medal 09/10/2019  Procedures:   Chest x-ray 09/03/2019  Upper endoscopy 09/04/2019--per Dr. Tarri Glenn  Left above-the-knee amputation per Dr. Sharol Given 09/08/2019 EGD on 09/04/2019 Impression: - Duodenal ulcer may be the cause of recent overt  GI bleeding. 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 Biopsied. - Erosive gastropathy with no bleeding and no  stigmata of recent bleeding. Biopsied. - A single non-bleeding angioectasia in the  duodenum. - Small hiatal  hernia. - Unable to proceed with colonoscopy as planned due  to incomplete preparation. Patient had formed stool  on last bowel movement. Recommendation: - Return patient to hospital ward for ongoing care. - Clear liquid diet. Advance as tolerated. - No aspirin, ibuprofen, naproxen, or other  non-steroidal anti-inflammatory drugs. - Patient has a prolonged QT. Discussed with Dr.  Starla Link who agreed with trial of pantoprazole 40 mg  IV BID with close monitoring of QTc. - Will defer colonoscopy at this time given the  challenges that the patient has prepping for the  procedure. Duodenal ulcer may be the source of  bleeding. - Continue serial hgb/hct with transfusion as  indicated.  Exchange of left IJ central line for triple-lumen HD catheter--per IR Dr. Laurence Ferrari 09/10/2019  MRI right foot 09/10/2019  Antimicrobials:   IV Ancef 09/08/2019 preoperatively  IV Zyvox 09/08/2019>>>>> 09/10/2019    Objective: Vitals:   09/22/19 1530 09/22/19 1600 09/22/19 1610 09/22/19 1630  BP: (!) 94/39 (!) 93/42 (!) 100/51 (!) 100/50  Pulse: 79 (!) 57 81 80  Resp: 20 20 20  (!) 21  Temp:   97.7 F (36.5 C)   TempSrc:   Oral   SpO2:   97%   Weight:   53.3 kg   Height:        Intake/Output Summary (Last 24 hours) at 09/22/2019 1724 Last data filed at 09/22/2019 1610 Gross per 24 hour  Intake --  Output 482 ml  Net -482 ml   Filed Weights   09/22/19 0333 09/22/19 1231 09/22/19 1610  Weight: 53.7 kg 53.6 kg 53.3 kg    Examination:  Awake Alert, Oriented X 3, No new F.N deficits, Normal affect Rales,  chronically ill-appearing. Symmetrical Chest wall  movement, Good air movement bilaterally, CTAB RRR,No Gallops,Rubs or new Murmurs, No Parasternal Heave +ve B.Sounds, Abd Soft, No tenderness, No rebound - guarding or rigidity. No Cyanosis, status post left AKA, with small amount of blood can be seen through gauze, right foot with transmetatarsal amputation.  Sacral pressure ulcer.       Data Reviewed: I have personally reviewed following labs and imaging studies  CBC: Recent Labs  Lab 09/17/19 0525 09/19/19 0855 09/20/19 1145 09/21/19 0243 09/22/19 0500  WBC 23.3* 28.0* 28.2* 27.6* 29.3*  HGB 8.6* 9.9* 8.1* 8.8* 7.9*  HCT 26.8* 31.9* 25.4* 28.0* 25.1*  MCV 65.5* 66.2* 66.5* 65.9* 67.5*  PLT 381 PLATELET CLUMPS NOTED ON SMEAR, UNABLE TO ESTIMATE 321 290 161    Basic Metabolic Panel: Recent Labs  Lab 09/17/19 0716 09/20/19 1146 09/20/19 1838 09/21/19 0243 09/22/19 0500  NA 137 136 137 138 137  K 4.4 3.1* 3.3* 3.4* 3.4*  CL 97* 98 101 99 100  CO2 28 28 27 27 27   GLUCOSE 95 95 93 100* 106*  BUN 22 20 9 15 19   CREATININE 4.50* 3.32* 2.24* 2.64* 2.83*  CALCIUM 7.7* 7.5* 7.8* 7.8* 7.5*  MG  --   --  1.7  --  1.8  PHOS 4.0 2.5  --   --  1.9*    GFR: Estimated Creatinine Clearance: 16.7 mL/min (A) (by C-G formula based on SCr of 2.83 mg/dL (H)).  Liver Function Tests: Recent Labs  Lab 09/17/19 0716 09/20/19 1146 09/22/19 0500  ALBUMIN 1.7* 1.9* 2.2*    CBG: No results for input(s): GLUCAP in the last 168 hours.   Recent Results (from the past 240 hour(s))  Culture, blood (routine x 2)     Status: None   Collection Time: 09/17/19 12:15 AM   Specimen: BLOOD  Result Value Ref Range Status   Specimen Description BLOOD RIGHT ARM  Final   Special Requests   Final    BOTTLES DRAWN AEROBIC ONLY Blood Culture results may not be optimal due to an inadequate volume of blood received in culture bottles   Culture   Final    NO GROWTH 5 DAYS Performed at [facility], [place] [address] [place], [place] [address]    Report Status 09/22/2019 FINAL  Final  Culture, blood (routine x 2)     Status: None   Collection Time: 09/17/19 12:15 AM   Specimen: BLOOD  Result Value Ref Range Status   Specimen Description BLOOD RIGHT HAND  Final   Special Requests   Final    BOTTLES DRAWN AEROBIC ONLY Blood Culture results may not be optimal due to an inadequate volume of blood received in culture bottles   Culture   Final    NO GROWTH 5 DAYS Performed at [facility], [place] [address], [place], [place] [address]    Report Status 09/22/2019 FINAL  Final         Radiology Studies: No results found.      Scheduled Meds: . vitamin C  500 mg Oral Q24H  . atorvastatin  40 mg Oral q1800  . B-complex with vitamin C  1 tablet Oral Daily  . Chlorhexidine Gluconate Cloth  6 each Topical Q0600  . cholecalciferol  1,000 Units Oral Daily  . cinacalcet  60 mg Oral QHS  . clopidogrel  75 mg Oral Daily  . [START ON 09/16/2019] darbepoetin (ARANESP) injection - DIALYSIS  150 mcg Intravenous Q Mon-HD  . docusate sodium  100 mg Oral BID  . feeding supplement (PRO-STAT SUGAR FREE 64)  30 mL Oral TID  . melatonin  3 mg Oral QHS  . midodrine  10 mg Oral TID WC  . pantoprazole  40 mg Oral Daily  . sodium chloride flush  10-40 mL Intracatheter Q12H  . sodium chloride flush  3 mL Intravenous Q12H  . sodium chloride flush  3 mL Intravenous Q12H   Continuous Infusions: . sodium chloride Stopped (09/04/19 1236)  . albumin human       LOS: 19 days     Oren Binet, MD [facility]   09/22/2019, 5:24 PM

## 2019-09-22 NOTE — Progress Notes (Signed)
[facility] Progress Note   Dialysis Orders: [facility]- MWF- 4 hours- Thigh AVG- BFR 400/DFR800- 2 K bath as OP- EDW 62. Gets heparin per tx- 2000 units per hour, epo 4000 and sensipar 60 mg. Last hgb there was 4/26 9.9  Assessment/ Plan:   1. GIB- EGD w/duodenal bulb ulcer. Gastric bx without malignancy or H. pylori. PPI per primary team. Hemoglobin relatively stable at this time 2. 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 Wound VAC off 3. Ventricular ectopy- asymptomatic. On amiodarone orally 4. ESRD- On HD MWF.Tolerating dialysis well with no issues. Continue to per his home schedule.Absolutely needs midodrine before HD.  HD today, tolerating treatment fine. MWF outpt.  Discussed continuation of dialysis - he is quite frail; he wishes to continue.   Dialysis Access: Left lower extremity thigh graft unable to access because of wound VACand 5/20 no bruit.  Dr. Augustin Coupe d/w Dr. Carlis Abbott [facility] - plan to wait 2-4 weeks to attempt declot ( would like to let the wound heal fully before attempting a declot. In addition some blood seepage bet staples so another reason not to perform declot immediately).  Current access is REJ TDC placed 5/20  6. Hypotension/volume-no signs of significant volume overload on midodrine TID (he says LUE edema always present).  7. Anemiaof CKD-Hgb stable in 8-9s s/p 1unit pRBC during admission. Status post iron for low T sat of 11%. Continue ESA, ^ dose.  Hb today is 7.9, may need another transfusion.  8. Secondary Hyperparathyroidism -calcium and phosphorus acceptable. Continue sensipar- does not get vit D with HD-phosphorus (4 5/21)  on binder)Continue to monitor 9. Nutrition- Renal diet w/fluid restrictions, prostat BID  Subjective:   Seen on HD, feeling well.  Seems happy today. Again expresses desire to cont HD.    Objective:   BP (!) 93/38 (BP Location: Right Arm)   Pulse 87   Temp 98.5  F (36.9 C)   Resp (!) 21   Ht 5' 5.98" (1.676 m)   Wt 53.6 kg   SpO2 95%   BMI 19.08 kg/m  No intake or output data in the 24 hours ending 09/22/19 1447 Weight change: 0.5 kg  Physical Exam: General:chronically ill appearing, frail male in NAD, more energetic today Heart:RRR, +1/6 systolic murmur Lungs:CTA anteriolaterally, bilateral chest rise Abdomen:soft, NTND Extremities:L AKA with staples, rt TMA; LUE 1-2+ pitting edema he says is chronic Dialysis Access: L thigh AVGNObruit , wound vac out now w/ staples; dressing with small amount of bloody drainage noted today.  REJ TC in use c/d/i  Imaging: No results found.  Labs: BMET Recent Labs  Lab 09/17/19 0716 09/20/19 1146 09/20/19 1838 09/21/19 0243 09/22/19 0500  NA 137 136 137 138 137  K 4.4 3.1* 3.3* 3.4* 3.4*  CL 97* 98 101 99 100  CO2 28 28 27 27 27   GLUCOSE 95 95 93 100* 106*  BUN 22 20 9 15 19   CREATININE 4.50* 3.32* 2.24* 2.64* 2.83*  CALCIUM 7.7* 7.5* 7.8* 7.8* 7.5*  PHOS 4.0 2.5  --   --  1.9*   CBC Recent Labs  Lab 09/19/19 0855 09/20/19 1145 09/21/19 0243 09/22/19 0500  WBC 28.0* 28.2* 27.6* 29.3*  HGB 9.9* 8.1* 8.8* 7.9*  HCT 31.9* 25.4* 28.0* 25.1*  MCV 66.2* 66.5* 65.9* 67.5*  PLT PLATELET CLUMPS NOTED ON SMEAR, UNABLE TO ESTIMATE 321 290 329    Medications:    . vitamin C  500 mg Oral Q24H  . atorvastatin  40 mg Oral q1800  . B-complex with vitamin C  1 tablet Oral Daily  . Chlorhexidine Gluconate Cloth  6 each Topical Q0600  . cholecalciferol  1,000 Units Oral Daily  . cinacalcet  60 mg Oral QHS  . clopidogrel  75 mg Oral Daily  . darbepoetin (ARANESP) injection - DIALYSIS  100 mcg Intravenous Q Mon-HD  . docusate sodium  100 mg Oral BID  . feeding supplement (PRO-STAT SUGAR FREE 64)  30 mL Oral TID  . melatonin  3 mg Oral QHS  . midodrine  10 mg Oral TID WC  . pantoprazole  40 mg Oral Daily  . sodium chloride flush  10-40 mL Intracatheter Q12H  . sodium chloride flush  3 mL  Intravenous Q12H  . sodium chloride flush  3 mL Intravenous Q12H    Jannifer Hick MD [facility] Pager [phone]

## 2019-09-23 DIAGNOSIS — I959 Hypotension, unspecified: Secondary | ICD-10-CM | POA: Diagnosis not present

## 2019-09-23 DIAGNOSIS — R410 Disorientation, unspecified: Secondary | ICD-10-CM | POA: Diagnosis not present

## 2019-09-23 DIAGNOSIS — Z89512 Acquired absence of left leg below knee: Secondary | ICD-10-CM | POA: Diagnosis not present

## 2019-09-23 DIAGNOSIS — Z89431 Acquired absence of right foot: Secondary | ICD-10-CM | POA: Diagnosis not present

## 2019-09-23 DIAGNOSIS — T82838A Hemorrhage of vascular prosthetic devices, implants and grafts, initial encounter: Secondary | ICD-10-CM | POA: Diagnosis not present

## 2019-09-23 DIAGNOSIS — L89154 Pressure ulcer of sacral region, stage 4: Secondary | ICD-10-CM | POA: Diagnosis present

## 2019-09-23 DIAGNOSIS — I9589 Other hypotension: Secondary | ICD-10-CM | POA: Diagnosis not present

## 2019-09-23 DIAGNOSIS — I6990 Unspecified sequelae of unspecified cerebrovascular disease: Secondary | ICD-10-CM | POA: Diagnosis not present

## 2019-09-23 DIAGNOSIS — I132 Hypertensive heart and chronic kidney disease with heart failure and with stage 5 chronic kidney disease, or end stage renal disease: Secondary | ICD-10-CM | POA: Diagnosis present

## 2019-09-23 DIAGNOSIS — T8781 Dehiscence of amputation stump: Secondary | ICD-10-CM | POA: Diagnosis not present

## 2019-09-23 DIAGNOSIS — R404 Transient alteration of awareness: Secondary | ICD-10-CM | POA: Diagnosis not present

## 2019-09-23 DIAGNOSIS — N186 End stage renal disease: Secondary | ICD-10-CM | POA: Diagnosis not present

## 2019-09-23 DIAGNOSIS — M1990 Unspecified osteoarthritis, unspecified site: Secondary | ICD-10-CM | POA: Diagnosis present

## 2019-09-23 DIAGNOSIS — I471 Supraventricular tachycardia: Secondary | ICD-10-CM | POA: Diagnosis not present

## 2019-09-23 DIAGNOSIS — K922 Gastrointestinal hemorrhage, unspecified: Secondary | ICD-10-CM | POA: Diagnosis not present

## 2019-09-23 DIAGNOSIS — R5383 Other fatigue: Secondary | ICD-10-CM | POA: Diagnosis not present

## 2019-09-23 DIAGNOSIS — E43 Unspecified severe protein-calorie malnutrition: Secondary | ICD-10-CM | POA: Diagnosis not present

## 2019-09-23 DIAGNOSIS — Z452 Encounter for adjustment and management of vascular access device: Secondary | ICD-10-CM | POA: Diagnosis not present

## 2019-09-23 DIAGNOSIS — J189 Pneumonia, unspecified organism: Secondary | ICD-10-CM | POA: Diagnosis not present

## 2019-09-23 DIAGNOSIS — Z743 Need for continuous supervision: Secondary | ICD-10-CM | POA: Diagnosis not present

## 2019-09-23 DIAGNOSIS — I5032 Chronic diastolic (congestive) heart failure: Secondary | ICD-10-CM | POA: Diagnosis not present

## 2019-09-23 DIAGNOSIS — E785 Hyperlipidemia, unspecified: Secondary | ICD-10-CM | POA: Diagnosis not present

## 2019-09-23 DIAGNOSIS — A4151 Sepsis due to Escherichia coli [E. coli]: Secondary | ICD-10-CM | POA: Diagnosis present

## 2019-09-23 DIAGNOSIS — M25552 Pain in left hip: Secondary | ICD-10-CM | POA: Diagnosis not present

## 2019-09-23 DIAGNOSIS — I120 Hypertensive chronic kidney disease with stage 5 chronic kidney disease or end stage renal disease: Secondary | ICD-10-CM | POA: Diagnosis not present

## 2019-09-23 DIAGNOSIS — Y95 Nosocomial condition: Secondary | ICD-10-CM | POA: Diagnosis present

## 2019-09-23 DIAGNOSIS — Z20822 Contact with and (suspected) exposure to covid-19: Secondary | ICD-10-CM | POA: Diagnosis present

## 2019-09-23 DIAGNOSIS — J9601 Acute respiratory failure with hypoxia: Secondary | ICD-10-CM | POA: Diagnosis not present

## 2019-09-23 DIAGNOSIS — I428 Other cardiomyopathies: Secondary | ICD-10-CM | POA: Diagnosis present

## 2019-09-23 DIAGNOSIS — A419 Sepsis, unspecified organism: Secondary | ICD-10-CM | POA: Diagnosis not present

## 2019-09-23 DIAGNOSIS — Z515 Encounter for palliative care: Secondary | ICD-10-CM | POA: Diagnosis not present

## 2019-09-23 DIAGNOSIS — I360 Nonrheumatic tricuspid (valve) stenosis: Secondary | ICD-10-CM | POA: Diagnosis not present

## 2019-09-23 DIAGNOSIS — Z89612 Acquired absence of left leg above knee: Secondary | ICD-10-CM | POA: Diagnosis not present

## 2019-09-23 DIAGNOSIS — Z681 Body mass index (BMI) 19 or less, adult: Secondary | ICD-10-CM | POA: Diagnosis not present

## 2019-09-23 DIAGNOSIS — Z992 Dependence on renal dialysis: Secondary | ICD-10-CM | POA: Diagnosis not present

## 2019-09-23 DIAGNOSIS — R6520 Severe sepsis without septic shock: Secondary | ICD-10-CM | POA: Diagnosis not present

## 2019-09-23 DIAGNOSIS — J90 Pleural effusion, not elsewhere classified: Secondary | ICD-10-CM | POA: Diagnosis not present

## 2019-09-23 DIAGNOSIS — K284 Chronic or unspecified gastrojejunal ulcer with hemorrhage: Secondary | ICD-10-CM | POA: Diagnosis not present

## 2019-09-23 DIAGNOSIS — N2581 Secondary hyperparathyroidism of renal origin: Secondary | ICD-10-CM | POA: Diagnosis not present

## 2019-09-23 DIAGNOSIS — K260 Acute duodenal ulcer with hemorrhage: Secondary | ICD-10-CM | POA: Diagnosis not present

## 2019-09-23 DIAGNOSIS — Z8249 Family history of ischemic heart disease and other diseases of the circulatory system: Secondary | ICD-10-CM | POA: Diagnosis not present

## 2019-09-23 DIAGNOSIS — R6521 Severe sepsis with septic shock: Secondary | ICD-10-CM | POA: Diagnosis present

## 2019-09-23 DIAGNOSIS — I739 Peripheral vascular disease, unspecified: Secondary | ICD-10-CM | POA: Diagnosis not present

## 2019-09-23 DIAGNOSIS — D631 Anemia in chronic kidney disease: Secondary | ICD-10-CM | POA: Diagnosis not present

## 2019-09-23 DIAGNOSIS — Z66 Do not resuscitate: Secondary | ICD-10-CM | POA: Diagnosis not present

## 2019-09-23 DIAGNOSIS — I350 Nonrheumatic aortic (valve) stenosis: Secondary | ICD-10-CM | POA: Diagnosis not present

## 2019-09-23 DIAGNOSIS — G9341 Metabolic encephalopathy: Secondary | ICD-10-CM | POA: Diagnosis not present

## 2019-09-23 DIAGNOSIS — Z7401 Bed confinement status: Secondary | ICD-10-CM | POA: Diagnosis not present

## 2019-09-23 DIAGNOSIS — M6281 Muscle weakness (generalized): Secondary | ICD-10-CM | POA: Diagnosis not present

## 2019-09-23 DIAGNOSIS — I10 Essential (primary) hypertension: Secondary | ICD-10-CM | POA: Diagnosis not present

## 2019-09-23 DIAGNOSIS — R0602 Shortness of breath: Secondary | ICD-10-CM | POA: Diagnosis not present

## 2019-09-23 DIAGNOSIS — Z886 Allergy status to analgesic agent status: Secondary | ICD-10-CM | POA: Diagnosis not present

## 2019-09-23 DIAGNOSIS — I493 Ventricular premature depolarization: Secondary | ICD-10-CM | POA: Diagnosis not present

## 2019-09-23 DIAGNOSIS — D509 Iron deficiency anemia, unspecified: Secondary | ICD-10-CM | POA: Diagnosis not present

## 2019-09-23 DIAGNOSIS — I69354 Hemiplegia and hemiparesis following cerebral infarction affecting left non-dominant side: Secondary | ICD-10-CM | POA: Diagnosis not present

## 2019-09-23 DIAGNOSIS — R58 Hemorrhage, not elsewhere classified: Secondary | ICD-10-CM | POA: Diagnosis not present

## 2019-09-23 DIAGNOSIS — S68112S Complete traumatic metacarpophalangeal amputation of right middle finger, sequela: Secondary | ICD-10-CM | POA: Diagnosis not present

## 2019-09-23 DIAGNOSIS — L89152 Pressure ulcer of sacral region, stage 2: Secondary | ICD-10-CM | POA: Diagnosis not present

## 2019-09-23 DIAGNOSIS — Z7189 Other specified counseling: Secondary | ICD-10-CM | POA: Diagnosis not present

## 2019-09-23 DIAGNOSIS — R64 Cachexia: Secondary | ICD-10-CM | POA: Diagnosis present

## 2019-09-23 LAB — CBC
HCT: 32.1 % — ABNORMAL LOW (ref 39.0–52.0)
Hemoglobin: 9.9 g/dL — ABNORMAL LOW (ref 13.0–17.0)
MCH: 21.2 pg — ABNORMAL LOW (ref 26.0–34.0)
MCHC: 30.8 g/dL (ref 30.0–36.0)
MCV: 68.9 fL — ABNORMAL LOW (ref 80.0–100.0)
Platelets: ADEQUATE 10*3/uL (ref 150–400)
RBC: 4.66 MIL/uL (ref 4.22–5.81)
RDW: 26.8 % — ABNORMAL HIGH (ref 11.5–15.5)
WBC: 35.1 10*3/uL — ABNORMAL HIGH (ref 4.0–10.5)
nRBC: 0.1 % (ref 0.0–0.2)

## 2019-09-23 LAB — RENAL FUNCTION PANEL
Albumin: 2.2 g/dL — ABNORMAL LOW (ref 3.5–5.0)
Anion gap: 11 (ref 5–15)
BUN: 15 mg/dL (ref 8–23)
CO2: 24 mmol/L (ref 22–32)
Calcium: 7.9 mg/dL — ABNORMAL LOW (ref 8.9–10.3)
Chloride: 100 mmol/L (ref 98–111)
Creatinine, Ser: 2.09 mg/dL — ABNORMAL HIGH (ref 0.61–1.24)
GFR calc Af Amer: 35 mL/min — ABNORMAL LOW (ref >60)
GFR calc non Af Amer: 30 mL/min — ABNORMAL LOW (ref >60)
Glucose, Bld: 93 mg/dL (ref 70–99)
Phosphorus: 1.9 mg/dL — ABNORMAL LOW (ref 2.5–4.6)
Potassium: 5.1 mmol/L (ref 3.5–5.1)
Sodium: 135 mmol/L (ref 135–145)

## 2019-09-23 LAB — MAGNESIUM: Magnesium: 2.2 mg/dL (ref 1.7–2.4)

## 2019-09-23 LAB — HEPATITIS B SURFACE ANTIGEN: Hepatitis B Surface Ag: NONREACTIVE

## 2019-09-23 MED ORDER — OXYCODONE HCL 5 MG PO TABS: 5.0000 mg | ORAL_TABLET | Freq: Four times a day (QID) | ORAL | 0 refills | Status: AC | PRN

## 2019-09-23 NOTE — Progress Notes (Signed)
Attempted to call report again, no answer.

## 2019-09-23 NOTE — Progress Notes (Signed)
Attempted to call report X1 to [facility] [facility] [employer], [place]. I spoke with front desk but no one would answer when she transferred me.  I will attempt again.

## 2019-09-23 NOTE — TOC Transition Note (Addendum)
Transition of Care [facility]) - CM/SW Discharge Note   Patient Details  Name: Joseph LAFOSSE Sr. MRN: [id number] Date of Birth: 1940/12/11  [facility] of [facility]) CM/SW Contact:  Benard Halsted, LCSW Phone Number: 09/23/2019, 10:26 AM   Clinical Narrative:    Patient will DC to: [facility] Anticipated DC date: 09/23/19 Family notified: Daughter, [name] Transport by: Corey Harold   Per MD patient ready for DC to [facility]. RN, patient, patient's family, and facility notified of DC. Discharge Summary and FL2 sent to facility. RN to call report prior to discharge [phone] Room 211). DC packet on chart, including scripts. Ambulance transport requested for patient.   CSW will sign off for now as social work intervention is no longer needed. Please consult [place] again if new needs arise.      Final next level of care: Skilled Nursing Facility Barriers to Discharge: No Barriers Identified   Patient Goals and CMS Choice Patient states their goals for this hospitalization and ongoing recovery are:: To return to SNF CMS Medicare.gov Compare Post Acute Care list provided to:: Patient Choice offered to / list presented to : Patient, Adult Children  Discharge Placement   Existing PASRR number confirmed : 09/23/19          Patient chooses bed at: [facility] Patient to be transferred to facility by: [place] Name of family member notified: [name], daughter Patient and family notified of of transfer: 09/23/19  Discharge Plan and Services In-house Referral: Clinical Social Work   Post Acute Care Choice: [place]                               Social Determinants of Health (SDOH) Interventions     Readmission Risk Interventions Readmission Risk Prevention Plan 09/10/2019 09/08/2019 09/08/2019  Transportation Screening Complete - Complete  PCP or Specialist Appt within 3-5 Days - - Complete  [place] or [place] - - Complete  Social Work Consult for  [place] Planning/Counseling - - Complete  Palliative Care Screening - Complete Not Applicable  Medication Review Press photographer) - - Complete  Some recent data might be hidden

## 2019-09-23 NOTE — Progress Notes (Signed)
Palliative Care Follow-up Reason: Decline During Hospitalization  Met with patient yesterday given his continued to decline. He was confused when I arrived at the bedside and his speech was difficult to understand but he re-oriented easily and was able to have a discussion with me that demonstrated he had capacity for decision making and was able to talk about his current experience.  1. He tells me he has pain most of the time 2. He has struggled with the loss of his independence and in the past even thought about taking his own life. 3. He wants to go to his home but there is no one to help him or care for him 4. He knows that his medical problems are serious and likely to not get much better 5. He has been on HD for 20+ years and it is a part of his life and he wouldn't easily stop that unless he could not for medical reasons continue 6. He does not want resuscitation, but for now willing to receive treatment for reversible or manageable illness.  Updated his code status order.  Lane Hacker, DO Palliative Medicine  Time: 35 min Greater than 50%  of this time was spent counseling and coordinating care related to the above assessment and plan.

## 2019-09-23 NOTE — Progress Notes (Signed)
Renal Navigator notes plan for discharge to new SNF facility today. Navigator faxed discharge records to patient's OP HD clinic/[facility] to provide continuity of care and notify them that he should return to the clinic tomorrow for next HD treatment.  Alphonzo Cruise, [place] Renal Navigator  [phone]

## 2019-09-23 NOTE — Discharge Summary (Signed)
PATIENT DETAILS Name: [name]. Age: 79 y.o. Sex: male Date of Birth: [birth year] MRN: [id number]. Admitting Physician: Modena Jansky, MD SWN:IOEVO, Brandon Melnick, MD  Admit Date: 09/03/2019 Discharge date: 09/23/2019  Recommendations for Outpatient Follow-up:  1. Follow up with PCP in 1-2 weeks 2. Please obtain CMP/CBC in one week 3. Please ensure follow-up with vascular surgery 4. Please ensure follow-up with Dr. Sharol Given 5. Please ensure follow-up with hemodialysis center 6. Avoid QTC prolonging agents 7. Consider palliative care evaluation/follow-up while at SNF.  Admitted From:  Home  Disposition: SNF    Home Health: No  Equipment/Devices: None  Discharge Condition: Stable   CODE STATUS: Partial Code-NO INTUBATION/NO CPR  Diet recommendation:  Diet Order            Diet - low sodium heart healthy        DIET DYS 2 Room service appropriate? 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  Patient developed progressive gangrenous necrotic changes with left transtibial amputation stump dehiscence, evaluated outpatient by orthopedics and presented to [facility] on 09/03/2019 for above-knee amputation. However in the preoperative area, patient noted  to have rectal bleeding for which [facility] was consulted and advised that the procedure will need to be postponed.Patient was subsequently admitted.  Endoscopy was significant for duodenal ulcer, bleeding has resolved, and he had his left AKA done by Dr. Sharol Given 5/12, his hospital course was complicated by extreme frailty, arrhythmias, prolonged QTC, please see discussion below  Brief Hospital Course: Upper GI bleed secondary to duodenal ulcer/acute blood loss anemia: Resolved-hemoglobin now stable.  Did require a unit of PRBC transfusion.  Plavix was initially held-but now has been resumed.  Patient is s/p EGD on 5/8 which showed a duodenal ulcer-colonoscopy was not performed as patient had a poor prep. Continue to follow CBC periodically.  End-stage renal disease on hemodialysis: Management per nephrology.  Had tunneled HD catheter placed on 5/20-as unable to access left lower extremity thigh graft due to wound VAC.  Vascular surgery plans to wait 2-4 weeks to attempt to declot.  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  Will need outpatient follow-up by orthopedics.  Leukocytosis: Thought to be reactive secondary to chronic inflammation related to stump dehiscence-blood cultures on 5/14 and 5/21 negative.   Patient has been afebrile-initially was on IV Zyvox but this was discontinued.  ID was consulted-MRI of the right foot was done that did not show any definite signs of underlying osteomyelitis.  Subsequently all IV antibiotics were discontinued-ID has signed off.  He still has persistent leukocytosis but he is nontoxic-appearing-afebrile-appears to be a chronic issue for the past 2 months.  Since this issue has been ongoing for 2 months-and patient is relatively stable-he is stable to be monitored in the outpatient setting.  Please repeat CBC periodically.  If he  has fever-we will need a low threshold to resume antibiotics  and consider further work-up.  Chronic diastolic heart failure: Volume status stable-diuresis with HD.  Chronic hypotension: Continue with midodrine.    DGU:YQIHKVQ noted to have a run of SVT with heart rates as high as the 200s in the PACU postoperatively on 09/08/2019.  Patient received a dose of IV metoprolol and SVT broken and heart rate in the 70s.  Patient started on amiodarone and dose increased to 200 mg daily per cardiology for ventricular ectopy-however lately-had prolonged QTC-and amiodarone was discontinued by cardiology. Outpatient follow-up with cardiology.    Ventricular ectopy: Previously on amiodarone-but due to prolonged QTC-this was discontinued by cardiology.    Severe Aortic stenosis:Noted on TEE during previous admission.  Patient noted to not be a candidate for TAVR.  Avoid hypotension.  Cardiology was following but have signed off.   Prolonged QTC: Previous MD discussed with cardiology-subsequently EP evaluation completed-amiodarone discontinued.  Avoid QTC prolonging agents.  Continue telemetry monitoring.  Iron deficiency anemia/anemia of chronic disease:Continue to monitor closely and transfuse as needed   History of QVZ:DGLOVFIE with Plavix  Generalized deconditioning/goals of care Patient overall with a poor prognosis.  Palliative care has assessed and followed patient.  Patient now with partial code.  Patient wishes to have CPR and ACLS medication but no intubation or shock.   Nutrition Problem: Nutrition Problem: Severe Malnutrition Etiology: chronic illness(ESRD on HD, CHF, non-healing wounds) Signs/Symptoms: severe fat depletion, severe muscle depletion, percent weight loss(22.7% weight loss in 7 months) Percent weight loss: 22.7 %(7 months) Interventions: Magic cup, Nepro shake, Prostat  RN pressure injury documentation: Pressure Injury 09/05/19 Sacrum Circumferential Stage 2 -  Partial thickness loss of dermis presenting as a shallow open injury  with a red, pink wound bed without slough. This wound was documented on previous admission in April (Active)  09/05/19 2000  Location: Sacrum  Location Orientation: Circumferential  Staging: Stage 2 -  Partial thickness loss of dermis presenting as a shallow open injury with a red, pink wound bed without slough.  Wound Description (Comments): This wound was documented on previous admission in April  Present on Admission: Yes     Discharge Diagnoses:  Principal Problem:   GI bleeding Active Problems:   Encounter for central line placement   ESRD on dialysis [facility])   Aortic stenosis   PVD (peripheral vascular disease) ([place])   Essential hypertension   Duodenal ulcer   Palliative care encounter   DNR (do not resuscitate) discussion   Goals of care, counseling/discussion   Dehiscence of amputation stump ([place])   Chronic hypotension   SVT (supraventricular tachycardia) ([place])   Ventricular ectopy   Discharge Instructions:  Activity:  As tolerated with Full fall precautions use walker/cane & assistance as needed   Discharge Instructions    Diet - low sodium heart healthy   Complete by: As directed    Discharge instructions   Complete by: As directed    Follow with Primary MD [name], MD or SNF physician.  Get CBC, CMP,  checked  by Primary MD next visit.    Disposition SNF   Diet: Renal diet 1200 cc fluid restriction, dysphagia 2 with thin liquid  On your next visit with your primary care physician please Get Medicines reviewed and adjusted.   Please request your Prim.MD to go over all Hospital Tests and Procedure/Radiological results at the follow up, please get all Hospital records sent to your Prim MD by signing hospital release  before you go home.   If you experience worsening of your admission symptoms, develop shortness of breath, life threatening emergency, suicidal or homicidal thoughts you must seek medical attention immediately by calling 911 or calling  your MD immediately  if symptoms less severe.  You Must read complete instructions/literature along with all the possible adverse reactions/side effects for all the Medicines you take and that have been prescribed to you. Take any new Medicines after you have completely understood and accpet all the possible adverse reactions/side effects.   Do not drive, operating heavy machinery, perform activities at heights, swimming or participation in water activities or provide baby sitting services if your were admitted for syncope or siezures until you have seen by Primary MD or a Neurologist and advised to do so again.  Do not drive when taking Pain medications.    Do not take more than prescribed Pain, Sleep and Anxiety Medications  Special Instructions: If you have smoked or chewed Tobacco  in the last 2 yrs please stop smoking, stop any regular Alcohol  and or any Recreational drug use.  Wear Seat belts while driving.   Please note  You were cared for by a hospitalist during your hospital stay. If you have any questions about your discharge medications or the care you received while you were in the hospital after you are discharged, you can call the unit and asked to speak with the hospitalist on call if the hospitalist that took care of you is not available. Once you are discharged, your primary care physician will handle any further medical issues. Please note that NO REFILLS for any discharge medications will be authorized once you are discharged, as it is imperative that you return to your primary care physician (or establish a relationship with a primary care physician if you do not have one) for your aftercare needs so that they can reassess your need for medications and monitor your lab values.   Discharge wound care:   Complete by: As directed    Change dressing daily 4x4's abd and tape.  May cleanse dressing with mild soap and water at left AKA site   Increase activity slowly   Complete  by: As directed      Allergies as of 09/23/2019      Reactions   Aspirin Other (See Comments)   Causes internal bleeding  History of ulcers      Medication List    STOP taking these medications   doxercalciferol 2.5 MCG capsule Commonly known as: [name]   paricalcitol 2 MCG capsule Commonly known as: ZEMPLAR   vitamin C 500 MG tablet Commonly known as: ASCORBIC ACID     TAKE these medications   acetaminophen 325 MG tablet Commonly known as: TYLENOL Take 2 tablets (650 mg total) by mouth every 6 (six) hours as needed for mild pain (or Fever >/= 101).   atorvastatin 40 MG tablet Commonly known as: LIPITOR Take 40 mg by mouth daily.   B-complex with vitamin C tablet Take 1 tablet by mouth daily.   cholecalciferol 25 MCG (1000 UNIT) tablet Commonly known as: VITAMIN D3 Take 1,000 Units by mouth daily.   cinacalcet 60 MG tablet Commonly known as: SENSIPAR Take 60 mg by mouth at bedtime.   clopidogrel 75 MG tablet Commonly known as: PLAVIX Take 1 tablet (75 mg total) by mouth daily. Start on 5/25 What changed: additional instructions   feeding supplement (NEPRO CARB STEADY) Liqd Take 237 mLs by mouth 2 (two)  times daily between meals. What changed: when to take this   feeding supplement (PRO-STAT SUGAR FREE 64) Liqd Take 30 mLs by mouth in the morning and at bedtime.   melatonin 3 MG Tabs tablet Take 3 mg by mouth at bedtime.   midodrine 10 MG tablet Commonly known as: PROAMATINE Take 1 tablet (10 mg total) by mouth 3 (three) times daily.   mirtazapine 7.5 MG tablet Commonly known as: REMERON Take 7.5 mg by mouth at bedtime.   oxyCODONE 5 MG immediate release tablet Commonly known as: Oxy IR/ROXICODONE Take 1 tablet (5 mg total) by mouth every 6 (six) hours as needed for severe pain (5 mg mild pain/ 10 mg moderate pain). What changed:   how much to take  when to take this  reasons to take this   pantoprazole 40 MG tablet Commonly known as:  Protonix Take 1 tablet (40 mg total) by mouth 2 (two) times daily.   sennosides-docusate sodium 8.6-50 MG tablet Commonly known as: SENOKOT-S Take 1 tablet by mouth daily.            Discharge Care Instructions  (From admission, onward)         Start     Ordered   09/18/19 0000  Discharge wound care:    Comments: Change dressing daily 4x4's abd and tape.  May cleanse dressing with mild soap and water at left AKA site   09/18/19 1115          Contact information for follow-up providers    Persons, Bevely Palmer, [place] Follow up in 1 week(s).   Specialty: Orthopedic Surgery Contact information: [place] [address] [phone]        Erma Heritage, PA-C Follow up.   Specialties: Physician Assistant, Cardiology Why: An appointment has been scheduled for you at [facility] on Tuesday Oct 26, 2019 at 3:00 PM (Arrive by 2:45 PM). This is with one of our PAs, [place], who works closely with Dr. Domenic Polite.  Contact information: [address] [place] [address] [phone]        [name], MD. Schedule an appointment as soon as possible for a visit in 1 week(s).   Specialty: Orthopedic Surgery Contact information: [place] [address] [phone]        [name], MD. Schedule an appointment as soon as possible for a visit in 1 week(s).   Specialty: Internal Medicine Contact information: [place] [address] [phone]        Satira Sark, MD .   Specialty: Cardiology Contact information: [place] [address] [phone]        [facility]. Schedule an appointment as soon as possible for a visit in 2 week(s).   Specialty: Vascular Surgery Contact information: [address] [place] [phone]       Hemodialysis Center Follow up.   Why: At your usual schedule             Contact information for after-discharge care    [facility] .   Service: Skilled Nursing Contact information: [address] [place] [address] [phone]                 Allergies  Allergen Reactions  . Aspirin Other (See Comments)    Causes internal bleeding  History of ulcers  Consultations:   cardiology, ID, GI, nephrology and orthopedic surgery  Palliative care   Other Procedures/Studies: MR FOOT RIGHT WO CONTRAST  Result Date: 09/10/2019 CLINICAL DATA:  Fever elevated WBC infection of the right forefoot EXAM: MRI OF THE RIGHT FOREFOOT WITHOUT CONTRAST TECHNIQUE: Multiplanar, multisequence MR imaging of the right was performed. No intravenous contrast was administered. COMPARISON:  None. 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 There is mildly increased T2 hyperintense signal seen within the proximal third metatarsal shaft with a minimally increased T1 hypointense signal. There is also mildly increased marrow signal seen at the amputation site of the first metatarsal, however no T1 hypointensity. Ligaments The Lisfranc ligaments are intact. Muscles and Tendons Mild fatty atrophy noted within the muscles surrounding the forefoot. The visualized portions of the extensor and flexor tendons are intact. Soft tissues Mild skin irregularity with superficial ulceration and subcutaneous edema seen overlying the third metatarsal amputation site. However no sinus tract or loculated fluid collection. IMPRESSION: Mild overlying skin irregularity and edema seen at the third and fourth metatarsal amputation sites with intramedullary signal changes in the proximal third metatarsal shaft which could be due to mild osteomyelitis or reactive marrow. No soft tissue abscess or sinus tract. Probable reactive marrow within the amputation site of the first  metatarsal. Electronically Signed   By: Prudencio Pair M.D.   On: 09/10/2019 20:55   IR Fluoro Guide CV Line Left  Result Date: 09/10/2019 INDICATION: 79 year old male with end-stage renal disease on hemodialysis. 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 He requires a temporary catheter for hemodialysis. Ultrasound evaluation of the right internal jugular vein demonstrates an occluded IJ. On the left, a central venous catheter is currently present. The left internal jugular vein narrows around the catheter and may also be occluded. EXAM: 1. Ultrasound-guided puncture left internal jugular vein 2. Catheter exchange MEDICATIONS: None. ANESTHESIA/SEDATION: None FLUOROSCOPY TIME:  Fluoroscopy Time: 6 minutes 12 seconds (25 mGy). COMPLICATIONS: None immediate. PROCEDURE: Informed written consent was obtained from the patient after a thorough discussion of the procedural risks, benefits and alternatives. All questions were addressed. Maximal Sterile Barrier Technique was utilized including caps, mask, sterile gowns, sterile gloves, sterile drape, hand hygiene and skin antiseptic. A timeout was performed prior to the initiation of the procedure. The left internal jugular vein was interrogated with ultrasound and found to be abnormal. A central venous catheter is present within the vessel. More superiorly in the neck, the vessel remains patent although the wall is thickened. However, as ultrasound was used to track more distally along the vessel, the vessel becomes narrowed surrounding the central venous catheter concerning for occlusion. As the patient has very poor access options, the decision was made to attempt puncture of the patent portion of the IJ to see of a wire would pass centrally. An image was obtained and stored for the medical record. Local anesthesia was attained by infiltration with 1% lidocaine. Under real-time sonographic guidance, the vessel was  punctured with a 21 gauge micropuncture needle. Unfortunately, a wire could not be passed into the central veins. At this time, the decision was made to proceed with exchange of the existing central venous catheter for a temporary hemodialysis catheter with an additional third lumen for venous access. The retention sutures were cut of the existing triple-lumen catheter. The hub was cut off the catheter. A stiff glidewire was advanced through the central  lumen. Fluoroscopically, the tip of the existing catheter was actually within the azygos vein. The catheter was pulled back in till the tip was in the upper SVC. The stiff Glidewire was then successfully advanced through the heart and into the inferior vena cava. The central venous catheter was removed over the wire. The skin tract was dilated and a 20 cm triple-lumen non tunneled hemodialysis catheter was advanced over the wire and position with the tip at the superior cavoatrial junction. The catheter flushes and aspirates easily. The catheter was flushed with heparinized saline and secured to the skin with 0 Prolene suture. A sterile bandage was applied. IMPRESSION: 1. Occluded right internal jugular vein. 2. The left internal jugular vein is also occluded surrounding the existing central venous catheter. 3. A new catheter could not be placed, therefore the existing triple-lumen catheter was exchanged over a wire for a triple-lumen temporary hemodialysis catheter. Signed, Criselda Peaches, MD, [place] [facility] Radiology Electronically Signed   By: Jacqulynn Cadet M.D.   On: 09/10/2019 15:25   IR Fluoro Guide CV Line Right  Result Date: 09/16/2019 INDICATION: 79 year old male with a history of renal failure referred for hemodialysis catheter placement EXAM: TUNNELED CENTRAL VENOUS HEMODIALYSIS CATHETER PLACEMENT WITH ULTRASOUND AND FLUOROSCOPIC GUIDANCE MEDICATIONS: 2 g Ancef. The antibiotic was given in an  appropriate time interval prior to skin puncture. ANESTHESIA/SEDATION: Moderate (conscious) sedation was employed during this procedure. A total of Versed 0 mg and Fentanyl 50 mcg was administered intravenously. Moderate Sedation Time: 0 minutes. The patient's level of consciousness and vital signs were monitored continuously by radiology nursing throughout the procedure under my direct supervision. FLUOROSCOPY TIME:  Fluoroscopy Time: 0 minutes 18 seconds (3 mGy). COMPLICATIONS: None PROCEDURE: Informed written consent was obtained from the patient after a discussion of the risks, benefits, and alternatives to treatment. Questions regarding the procedure were encouraged and answered. The right neck and chest were prepped with chlorhexidine in a sterile fashion, and a sterile drape was applied covering the operative field. Maximum barrier sterile technique with sterile gowns and gloves were used for the procedure. A timeout was performed prior to the initiation of the procedure. Ultrasound survey was performed. Right external jugular vein was patent. Right internal jugular vein is occluded at the inlet. Micropuncture kit was utilized to access the right external jugular vein under direct, real-time ultrasound guidance after the overlying soft tissues were anesthetized with 1% lidocaine with epinephrine. Stab incision was made with 11 blade scalpel. Microwire was passed centrally. The microwire was then marked to measure appropriate internal catheter length. External tunneled length was estimated. A total tip to cuff length of 23 cm was selected. 035 guidewire was advanced to the level of the IVC. Skin and subcutaneous tissues of chest wall below the clavicle were generously infiltrated with 1% lidocaine for local anesthesia. A small stab incision was made with 11 blade scalpel. The selected hemodialysis catheter was tunneled in a retrograde fashion from the anterior chest wall to the venotomy incision. Serial  dilation was performed and then a peel-away sheath was placed. The catheter was then placed through the peel-away sheath with tips ultimately positioned within the superior aspect of the right atrium. Final catheter positioning was confirmed and documented with a spot radiographic image. The catheter aspirates and flushes normally. The catheter was flushed with appropriate volume heparin dwells. The catheter exit site was secured with a 0-Prolene retention suture. Gel-Foam slurry was infused into the soft tissue tract. The  venotomy incision was closed Derma bond and sterile dressing. Dressings were applied at the chest wall. Patient tolerated the procedure well and remained hemodynamically stable throughout. No complications were encountered and no significant blood loss encountered. IMPRESSION: Status post image guided placement of right external jugular tunneled hemodialysis catheter. Catheter ready for use. Signed, Dulcy Fanny. Dellia Nims, RPVI [facility] Radiology Electronically Signed   By: Corrie Mckusick D.O.   On: 09/16/2019 11:57   IR US Guide Vasc Access Left  Result Date: 09/10/2019 INDICATION: 79 year old male with end-stage renal disease on hemodialysis. 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 He requires a temporary catheter for hemodialysis. Ultrasound evaluation of the right internal jugular vein demonstrates an occluded IJ. On the left, a central venous catheter is currently present. The left internal jugular vein narrows around the catheter and may also be occluded. EXAM: 1. Ultrasound-guided puncture left internal jugular vein 2. Catheter exchange MEDICATIONS: None. ANESTHESIA/SEDATION: None FLUOROSCOPY TIME:  Fluoroscopy Time: 6 minutes 12 seconds (25 mGy). COMPLICATIONS: None immediate. PROCEDURE: Informed written consent was obtained from the patient after a thorough discussion of the  procedural risks, benefits and alternatives. All questions were addressed. Maximal Sterile Barrier Technique was utilized including caps, mask, sterile gowns, sterile gloves, sterile drape, hand hygiene and skin antiseptic. A timeout was performed prior to the initiation of the procedure. The left internal jugular vein was interrogated with ultrasound and found to be abnormal. A central venous catheter is present within the vessel. More superiorly in the neck, the vessel remains patent although the wall is thickened. However, as ultrasound was used to track more distally along the vessel, the vessel becomes narrowed surrounding the central venous catheter concerning for occlusion. As the patient has very poor access options, the decision was made to attempt puncture of the patent portion of the IJ to see of a wire would pass centrally. An image was obtained and stored for the medical record. Local anesthesia was attained by infiltration with 1% lidocaine. Under real-time sonographic guidance, the vessel was punctured with a 21 gauge micropuncture needle. Unfortunately, a wire could not be passed into the central veins. At this time, the decision was made to proceed with exchange of the existing central venous catheter for a temporary hemodialysis catheter with an additional third lumen for venous access. The retention sutures were cut of the existing triple-lumen catheter. The hub was cut off the catheter. A stiff glidewire was advanced through the central lumen. Fluoroscopically, the tip of the existing catheter was actually within the azygos vein. The catheter was pulled back in till the tip was in the upper SVC. The stiff Glidewire was then successfully advanced through the heart and into the inferior vena cava. The central venous catheter was removed over the wire. The skin tract was dilated and a 20 cm triple-lumen non tunneled hemodialysis catheter was advanced over the wire and position with the tip at the  superior cavoatrial junction. The catheter flushes and aspirates easily. The catheter was flushed with heparinized saline and secured to the skin with 0 Prolene suture. A sterile bandage was applied. IMPRESSION: 1. Occluded right internal jugular vein. 2. The left internal jugular vein is also occluded surrounding the existing central venous catheter. 3. A new catheter could not be placed, therefore the existing triple-lumen catheter was exchanged over a wire for a triple-lumen temporary hemodialysis catheter. Signed, Criselda Peaches, MD, RPVI [facility] Radiology Electronically Signed   By: Jacqulynn Cadet M.D.   On: 09/10/2019 15:25   IR US Guide Vasc Access Right  Result Date: 09/16/2019 INDICATION: 79 year old male with a history of renal failure referred for hemodialysis catheter placement EXAM: TUNNELED CENTRAL VENOUS HEMODIALYSIS CATHETER PLACEMENT WITH ULTRASOUND AND FLUOROSCOPIC GUIDANCE MEDICATIONS: 2 g Ancef. The antibiotic was given in an appropriate time interval prior to skin puncture. ANESTHESIA/SEDATION: Moderate (conscious) sedation was employed during this procedure. A total of Versed 0 mg and Fentanyl 50 mcg was administered intravenously. Moderate Sedation Time: 0 minutes. The patient's level of consciousness and vital signs were monitored continuously by radiology nursing throughout the procedure under my direct supervision. FLUOROSCOPY TIME:  Fluoroscopy Time: 0 minutes 18 seconds (3 mGy). COMPLICATIONS: None PROCEDURE: Informed written consent was obtained from the patient after a discussion of the risks, benefits, and alternatives to treatment. Questions regarding the procedure were encouraged and answered. The right neck and chest were prepped with chlorhexidine in a sterile fashion, and a sterile drape was applied covering the operative field. Maximum barrier sterile technique with sterile gowns and gloves were used for the  procedure. A timeout was performed prior to the initiation of the procedure. Ultrasound survey was performed. Right external jugular vein was patent. Right internal jugular vein is occluded at the inlet. Micropuncture kit was utilized to access the right external jugular vein under direct, real-time ultrasound guidance after the overlying soft tissues were anesthetized with 1% lidocaine with epinephrine. Stab incision was made with 11 blade scalpel. Microwire was passed centrally. The microwire was then marked to measure appropriate internal catheter length. External tunneled length was estimated. A total tip to cuff length of 23 cm was selected. 035 guidewire was advanced to the level of the IVC. Skin and subcutaneous tissues of chest wall below the clavicle were generously infiltrated with 1% lidocaine for local anesthesia. A small stab incision was made with 11 blade scalpel. The selected hemodialysis catheter was tunneled in a retrograde fashion from the anterior chest wall to the venotomy incision. Serial dilation was performed and then a peel-away sheath was placed. The catheter was then placed through the peel-away sheath with tips ultimately positioned within the superior aspect of the right atrium. Final catheter positioning was confirmed and documented with a spot radiographic image. The catheter aspirates and flushes normally. The catheter was flushed with appropriate volume heparin dwells. The catheter exit site was secured with a 0-Prolene retention suture. Gel-Foam slurry was infused into the soft tissue tract. The venotomy incision was closed Derma bond and sterile dressing. Dressings were applied at the chest wall. Patient tolerated the procedure well and remained hemodynamically stable throughout. No complications were encountered and no significant blood loss encountered. IMPRESSION: Status post image guided placement of right external jugular tunneled hemodialysis catheter. Catheter ready for use.  Signed, Dulcy Fanny. Dellia Nims, RPVI [facility] Inter[facility] Radiology Special[facility] Radiology Electronically Signed   By: Corrie Mckusick D.O.   On: 09/16/2019 11:57   DG CHEST PORT 1 VIEW  Result Date: 09/10/2019 CLINICAL DATA:  Elevated white count. EXAM: PORTABLE CHEST 1 VIEW COMPARISON:  Sep 03, 2019. FINDINGS: Stable cardiomediastinal silhouette. Atherosclerosis of thoracic aorta is noted. Left internal jugular catheter is unchanged in position. No pneumothorax is noted. Right lung is clear. Mild left basilar atelectasis or infiltrate is noted with small left pleural effusion. Bony thorax is unremarkable. IMPRESSION: Aortic atherosclerosis. Mild left basilar atelectasis or infiltrate is noted with small  left pleural effusion. Aortic Atherosclerosis (ICD10-I70.0). Electronically Signed   By: Marijo Conception M.D.   On: 09/10/2019 10:57   DG CHEST PORT 1 VIEW  Result Date: 09/03/2019 CLINICAL DATA:  Central line placement. EXAM: PORTABLE CHEST 1 VIEW FINDINGS: Left IJ central venous catheter is coursing into the SVC but has a slight bend or curvature at its tip and it could be in the azygos vein. The cardiac silhouette, mediastinal and hilar contours are within normal limits and stable. There is moderate tortuosity and calcification of the thoracic aorta. Stable eventration of the left hemidiaphragm with some overlying vascular crowding and atelectasis. Prominent skin fold over the left chest. No pneumothorax. IMPRESSION: 1. Left IJ central venous catheter is in the SVC but the tip is likely in the azygos vein. 2. No acute cardiopulmonary findings. Electronically Signed   By: Marijo Sanes M.D.   On: 09/03/2019 13:34     TODAY-DAY OF DISCHARGE:  Subjective:   Tremon Sainvil today has no headache,no chest abdominal pain,no new weakness tingling or numbness, feels much better wants to go home today.   Objective:   Blood pressure (!) 99/45, pulse 88, temperature 99.4 F (37.4 C), temperature  source Oral, resp. rate 16, height 5' 5.98" (1.676 m), weight 52.2 kg, SpO2 99 %.  Intake/Output Summary (Last 24 hours) at 09/23/2019 1003 Last data filed at 09/23/2019 0900 Gross per 24 hour  Intake 120 ml  Output 482 ml  Net -362 ml   Filed Weights   09/22/19 1231 09/22/19 1610 09/23/19 0416  Weight: 53.6 kg 53.3 kg 52.2 kg    Exam: Awake Alert, Oriented *3, No new F.N deficits, Normal affect [place].AT,PERRAL Supple Neck,No JVD, No cervical lymphadenopathy appriciated.  Symmetrical Chest wall movement, Good air movement bilaterally, CTAB RRR,No Gallops,Rubs or new Murmurs, No Parasternal Heave +ve B.Sounds, Abd Soft, Non tender, No organomegaly appriciated, No rebound -guarding or rigidity. No Cyanosis, Clubbing or edema, No new Rash or bruise   PERTINENT RADIOLOGIC STUDIES: No results found.   PERTINENT LAB RESULTS: CBC: Recent Labs    09/22/19 0500 09/23/19 0508  WBC 29.3* 35.1*  HGB 7.9* 9.9*  HCT 25.1* 32.1*  PLT 329 PLATELET CLUMPS NOTED ON SMEAR, COUNT APPEARS ADEQUATE   CMET CMP     Component Value Date/Time   NA 135 09/23/2019 0639   K 5.1 09/23/2019 0639   CL 100 09/23/2019 0639   CO2 24 09/23/2019 0639   GLUCOSE 93 09/23/2019 0639   BUN 15 09/23/2019 0639   CREATININE 2.09 (H) 09/23/2019 0639   CREATININE 10.80 (H) 07/06/2015 1158   CALCIUM 7.9 (L) 09/23/2019 0639   PROT 6.4 (L) 09/03/2019 1358   ALBUMIN 2.2 (L) 09/23/2019 0639   AST 16 09/03/2019 1358   ALT 11 09/03/2019 1358   ALKPHOS 65 09/03/2019 1358   BILITOT 1.0 09/03/2019 1358   GFRNONAA 30 (L) 09/23/2019 0639   GFRAA 35 (L) 09/23/2019 0639    GFR Estimated Creatinine Clearance: 22.2 mL/min (A) (by C-G formula based on SCr of 2.09 mg/dL (H)). No results for input(s): LIPASE, AMYLASE in the last 72 hours. No results for input(s): CKTOTAL, CKMB, CKMBINDEX, TROPONINI in the last 72 hours. Invalid input(s): POCBNP No results for input(s): DDIMER in the last 72 hours. No results for  input(s): HGBA1C in the last 72 hours. No results for input(s): CHOL, HDL, LDLCALC, TRIG, CHOLHDL, LDLDIRECT in the last 72 hours. No results for input(s): TSH, T4TOTAL, T3FREE, THYROIDAB in the last 72 hours.  Invalid input(s): FREET3 No results for input(s): VITAMINB12, FOLATE, FERRITIN, TIBC, IRON, RETICCTPCT in the last 72 hours. Coags: No results for input(s): INR in the last 72 hours.  Invalid input(s): PT Microbiology: Recent Results (from the past 240 hour(s))  Culture, blood (routine x 2)     Status: None   Collection Time: 09/17/19 12:15 AM   Specimen: BLOOD  Result Value Ref Range Status   Specimen Description BLOOD RIGHT ARM  Final   Special Requests   Final    BOTTLES DRAWN AEROBIC ONLY Blood Culture results may not be optimal due to an inadequate volume of blood received in culture bottles   Culture   Final    NO GROWTH 5 DAYS Performed at [facility], [address] [place], [place] [address]    Report Status 09/22/2019 FINAL  Final  Culture, blood (routine x 2)     Status: None   Collection Time: 09/17/19 12:15 AM   Specimen: BLOOD  Result Value Ref Range Status   Specimen Description BLOOD RIGHT HAND  Final   Special Requests   Final    BOTTLES DRAWN AEROBIC ONLY Blood Culture results may not be optimal due to an inadequate volume of blood received in culture bottles   Culture   Final    NO GROWTH 5 DAYS Performed at [facility], [place] [address], [place], [place] [address]    Report Status 09/22/2019 FINAL  Final    FURTHER DISCHARGE INSTRUCTIONS:  Get Medicines reviewed and adjusted: Please take all your medications with you for your next visit with your Primary MD  Laboratory/radiological data: Please request your Primary MD to go over all hospital tests and procedure/radiological results at the follow up, please ask your Primary MD to get all Hospital records sent to his/her office.  In some cases, they will be blood work, cultures and  biopsy results pending at the time of your discharge. Please request that your primary care M.D. goes through all the records of your hospital data and follows up on these results.  Also Note the following: If you experience worsening of your admission symptoms, develop shortness of breath, life threatening emergency, suicidal or homicidal thoughts you must seek medical attention immediately by calling 911 or calling your MD immediately  if symptoms less severe.  You must read complete instructions/literature along with all the possible adverse reactions/side effects for all the Medicines you take and that have been prescribed to you. Take any new Medicines after you have completely understood and accpet all the possible adverse reactions/side effects.   Do not drive when taking Pain medications or sleeping medications (Benzodaizepines)  Do not take more than prescribed Pain, Sleep and Anxiety Medications. It is not advisable to combine anxiety,sleep and pain medications without talking with your primary care practitioner  Special Instructions: If you have smoked or chewed Tobacco  in the last 2 yrs please stop smoking, stop any regular Alcohol  and or any Recreational drug use.  Wear Seat belts while driving.  Please note: You were cared for by a hospitalist during your hospital stay. Once you are discharged, your primary care physician will handle any further medical issues. Please note that NO REFILLS for any discharge medications will be authorized once you are discharged, as it is imperative that you return to your primary care physician (or establish a relationship with a primary care physician if you do not have one) for your post hospital discharge needs so that  they can reassess your need for medications and monitor your lab values.  Total Time spent coordinating discharge including counseling, education and face to face time equals 35 minutes.  SignedOren Binet 09/23/2019 10:03  AM

## 2019-09-23 NOTE — Progress Notes (Signed)
[facility] Progress Note   Dialysis Orders: [facility]- MWF- 4 hours- Thigh AVG- BFR 400/DFR800- 2 K bath as OP- EDW 62. Gets heparin per tx- 2000 units per hour, epo 4000 and sensipar 60 mg. Last hgb there was 4/26 9.9  Assessment/ Plan:   1. GIB- EGD w/duodenal bulb ulcer. Gastric bx without malignancy or H. pylori. PPI per primary team. Hemoglobin relatively stable at this time 2. 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 Wound VAC off 3. Ventricular ectopy- asymptomatic. On amiodarone orally 4. ESRD- On HD MWF.Tolerating dialysis well with no issues. Continue to per his home schedule.Absolutely needs midodrine before HD.  HD here on schedule, tolerated yesterday no issue. MWF outpt.  Discussed continuation of dialysis - he is quite frail; he wishes to continue.   Dialysis Access: Left lower extremity thigh graft unable to access because of wound VACand 5/20 no bruit.  Dr. Augustin Coupe d/w Dr. Carlis Abbott [facility] - plan to wait 2-4 weeks to attempt declot ( would like to let the wound heal fully before attempting a declot. In addition some blood seepage bet staples so another reason not to perform declot immediately).  Current access is REJ TDC placed 5/20  6. Hypotension/volume-no signs of significant volume overload on midodrine TID (he says LUE edema always present).  7. Anemiaof CKD-Hgb stable in 8-9s s/p 1unit pRBC during admission. Status post iron for low T sat of 11%. Continue ESA, ^ dose.  Hb today is 9.9 8. Secondary Hyperparathyroidism -calcium and phosphorus acceptable. Continue sensipar- does not get vit D with HD-phosphorus (4 5/21)  on binder)Continue to monitor 9. Nutrition- Renal diet w/fluid restrictions, prostat BID  OK to d/c today per renal perspective.  Subjective:   Did fine with HD yesterday.  Set for D/C to SNF today. He reports he is happy to go.    Objective:   BP (!) 99/45 (BP Location: Right  Arm)   Pulse 88   Temp 99.4 F (37.4 C) (Oral)   Resp 16   Ht 5' 5.98" (1.676 m)   Wt 52.2 kg   SpO2 99%   BMI 18.58 kg/m   Intake/Output Summary (Last 24 hours) at 09/23/2019 1129 Last data filed at 09/23/2019 0900 Gross per 24 hour  Intake 120 ml  Output 482 ml  Net -362 ml   Weight change: -0.1 kg  Physical Exam: General:chronically ill appearing, frail male in NAD Lungs:CTA anteriolaterally, bilateral chest rise Extremities:L AKA with staples, rt TMA; LUE 1-2+ pitting edema he says is chronic Dialysis Access: L thigh AVGNObruit , wound vac out now w/ staples; dressing with small amount of bloody drainage noted today.  REJ TC c/d/i  Imaging: No results found.  Labs: BMET Recent Labs  Lab 09/17/19 0716 09/20/19 1146 09/20/19 1838 09/21/19 0243 09/22/19 0500 09/23/19 0639  NA 137 136 137 138 137 135  K 4.4 3.1* 3.3* 3.4* 3.4* 5.1  CL 97* 98 101 99 100 100  CO2 28 28 27 27 27 24   GLUCOSE 95 95 93 100* 106* 93  BUN 22 20 9 15 19 15   CREATININE 4.50* 3.32* 2.24* 2.64* 2.83* 2.09*  CALCIUM 7.7* 7.5* 7.8* 7.8* 7.5* 7.9*  PHOS 4.0 2.5  --   --  1.9* 1.9*   CBC Recent Labs  Lab 09/20/19 1145 09/21/19 0243 09/22/19 0500 09/23/19 0508  WBC 28.2* 27.6* 29.3* 35.1*  HGB 8.1* 8.8* 7.9* 9.9*  HCT 25.4* 28.0* 25.1* 32.1*  MCV 66.5* 65.9* 67.5* 68.9*  PLT 321 290 329 PLATELET CLUMPS NOTED ON SMEAR, COUNT APPEARS ADEQUATE    Medications:    . vitamin C  500 mg Oral Q24H  . atorvastatin  40 mg Oral q1800  . B-complex with vitamin C  1 tablet Oral Daily  . Chlorhexidine Gluconate Cloth  6 each Topical Q0600  . cholecalciferol  1,000 Units Oral Daily  . cinacalcet  60 mg Oral QHS  . clopidogrel  75 mg Oral Daily  . [START ON 09/01/2019] darbepoetin (ARANESP) injection - DIALYSIS  150 mcg Intravenous Q Mon-HD  . docusate sodium  100 mg Oral BID  . feeding supplement (PRO-STAT SUGAR FREE 64)  30 mL Oral TID  . melatonin  3 mg Oral QHS  . midodrine  10 mg Oral  TID WC  . pantoprazole  40 mg Oral Daily  . sodium chloride flush  10-40 mL Intracatheter Q12H  . sodium chloride flush  3 mL Intravenous Q12H  . sodium chloride flush  3 mL Intravenous Q12H    Jannifer Hick MD [facility] Pager [phone]

## 2019-09-23 NOTE — Progress Notes (Signed)
Attempted report one more time, still no answer. [facility] here to pick up patient.

## 2019-09-24 DIAGNOSIS — D509 Iron deficiency anemia, unspecified: Secondary | ICD-10-CM | POA: Diagnosis not present

## 2019-09-24 DIAGNOSIS — T8781 Dehiscence of amputation stump: Secondary | ICD-10-CM | POA: Diagnosis not present

## 2019-09-24 DIAGNOSIS — N186 End stage renal disease: Secondary | ICD-10-CM | POA: Diagnosis not present

## 2019-09-24 DIAGNOSIS — I10 Essential (primary) hypertension: Secondary | ICD-10-CM | POA: Diagnosis not present

## 2019-09-24 DIAGNOSIS — Z992 Dependence on renal dialysis: Secondary | ICD-10-CM | POA: Diagnosis not present

## 2019-09-24 DIAGNOSIS — D631 Anemia in chronic kidney disease: Secondary | ICD-10-CM | POA: Diagnosis not present

## 2019-09-24 DIAGNOSIS — R5383 Other fatigue: Secondary | ICD-10-CM | POA: Diagnosis not present

## 2019-09-24 DIAGNOSIS — N2581 Secondary hyperparathyroidism of renal origin: Secondary | ICD-10-CM | POA: Diagnosis not present

## 2019-09-24 DIAGNOSIS — S68112S Complete traumatic metacarpophalangeal amputation of right middle finger, sequela: Secondary | ICD-10-CM | POA: Diagnosis not present

## 2019-09-26 DIAGNOSIS — J90 Pleural effusion, not elsewhere classified: Secondary | ICD-10-CM | POA: Diagnosis not present

## 2019-09-26 DIAGNOSIS — Z886 Allergy status to analgesic agent status: Secondary | ICD-10-CM | POA: Diagnosis not present

## 2019-09-26 DIAGNOSIS — Z452 Encounter for adjustment and management of vascular access device: Secondary | ICD-10-CM | POA: Diagnosis not present

## 2019-09-26 DIAGNOSIS — T82838A Hemorrhage of vascular prosthetic devices, implants and grafts, initial encounter: Secondary | ICD-10-CM | POA: Diagnosis not present

## 2019-09-27 ENCOUNTER — Encounter (HOSPITAL_COMMUNITY): Payer: Self-pay | Admitting: Emergency Medicine

## 2019-09-27 ENCOUNTER — Emergency Department (HOSPITAL_COMMUNITY): Payer: Medicare Other

## 2019-09-27 ENCOUNTER — Inpatient Hospital Stay (HOSPITAL_COMMUNITY)
Admission: EM | Admit: 2019-09-27 | Discharge: 2019-10-28 | DRG: 871 | Disposition: E | Payer: Medicare Other | Source: Skilled Nursing Facility | Attending: Family Medicine | Admitting: Family Medicine

## 2019-09-27 ENCOUNTER — Other Ambulatory Visit: Payer: Self-pay

## 2019-09-27 DIAGNOSIS — R0602 Shortness of breath: Secondary | ICD-10-CM | POA: Diagnosis not present

## 2019-09-27 DIAGNOSIS — I428 Other cardiomyopathies: Secondary | ICD-10-CM | POA: Diagnosis present

## 2019-09-27 DIAGNOSIS — A4151 Sepsis due to Escherichia coli [E. coli]: Secondary | ICD-10-CM | POA: Diagnosis not present

## 2019-09-27 DIAGNOSIS — K922 Gastrointestinal hemorrhage, unspecified: Secondary | ICD-10-CM | POA: Diagnosis not present

## 2019-09-27 DIAGNOSIS — N250 Renal osteodystrophy: Secondary | ICD-10-CM | POA: Diagnosis not present

## 2019-09-27 DIAGNOSIS — M25552 Pain in left hip: Secondary | ICD-10-CM | POA: Diagnosis not present

## 2019-09-27 DIAGNOSIS — L89154 Pressure ulcer of sacral region, stage 4: Secondary | ICD-10-CM | POA: Diagnosis not present

## 2019-09-27 DIAGNOSIS — I120 Hypertensive chronic kidney disease with stage 5 chronic kidney disease or end stage renal disease: Secondary | ICD-10-CM | POA: Diagnosis not present

## 2019-09-27 DIAGNOSIS — R0603 Acute respiratory distress: Secondary | ICD-10-CM

## 2019-09-27 DIAGNOSIS — Z20822 Contact with and (suspected) exposure to covid-19: Secondary | ICD-10-CM | POA: Diagnosis present

## 2019-09-27 DIAGNOSIS — Z515 Encounter for palliative care: Secondary | ICD-10-CM | POA: Diagnosis not present

## 2019-09-27 DIAGNOSIS — Z8249 Family history of ischemic heart disease and other diseases of the circulatory system: Secondary | ICD-10-CM | POA: Diagnosis not present

## 2019-09-27 DIAGNOSIS — I9589 Other hypotension: Secondary | ICD-10-CM | POA: Diagnosis present

## 2019-09-27 DIAGNOSIS — I350 Nonrheumatic aortic (valve) stenosis: Secondary | ICD-10-CM | POA: Diagnosis present

## 2019-09-27 DIAGNOSIS — I132 Hypertensive heart and chronic kidney disease with heart failure and with stage 5 chronic kidney disease, or end stage renal disease: Secondary | ICD-10-CM | POA: Diagnosis present

## 2019-09-27 DIAGNOSIS — I5032 Chronic diastolic (congestive) heart failure: Secondary | ICD-10-CM | POA: Diagnosis present

## 2019-09-27 DIAGNOSIS — E785 Hyperlipidemia, unspecified: Secondary | ICD-10-CM | POA: Diagnosis present

## 2019-09-27 DIAGNOSIS — N186 End stage renal disease: Secondary | ICD-10-CM | POA: Diagnosis not present

## 2019-09-27 DIAGNOSIS — S68112S Complete traumatic metacarpophalangeal amputation of right middle finger, sequela: Secondary | ICD-10-CM | POA: Diagnosis not present

## 2019-09-27 DIAGNOSIS — Z992 Dependence on renal dialysis: Secondary | ICD-10-CM

## 2019-09-27 DIAGNOSIS — D509 Iron deficiency anemia, unspecified: Secondary | ICD-10-CM | POA: Diagnosis not present

## 2019-09-27 DIAGNOSIS — J90 Pleural effusion, not elsewhere classified: Secondary | ICD-10-CM | POA: Diagnosis not present

## 2019-09-27 DIAGNOSIS — Z89611 Acquired absence of right leg above knee: Secondary | ICD-10-CM

## 2019-09-27 DIAGNOSIS — R5383 Other fatigue: Secondary | ICD-10-CM | POA: Diagnosis not present

## 2019-09-27 DIAGNOSIS — G9341 Metabolic encephalopathy: Secondary | ICD-10-CM | POA: Diagnosis present

## 2019-09-27 DIAGNOSIS — R6520 Severe sepsis without septic shock: Secondary | ICD-10-CM | POA: Diagnosis not present

## 2019-09-27 DIAGNOSIS — R64 Cachexia: Secondary | ICD-10-CM | POA: Diagnosis present

## 2019-09-27 DIAGNOSIS — I471 Supraventricular tachycardia: Secondary | ICD-10-CM | POA: Diagnosis not present

## 2019-09-27 DIAGNOSIS — D72829 Elevated white blood cell count, unspecified: Secondary | ICD-10-CM | POA: Diagnosis present

## 2019-09-27 DIAGNOSIS — Z87891 Personal history of nicotine dependence: Secondary | ICD-10-CM

## 2019-09-27 DIAGNOSIS — R6521 Severe sepsis with septic shock: Secondary | ICD-10-CM | POA: Diagnosis not present

## 2019-09-27 DIAGNOSIS — M898X9 Other specified disorders of bone, unspecified site: Secondary | ICD-10-CM | POA: Diagnosis present

## 2019-09-27 DIAGNOSIS — I739 Peripheral vascular disease, unspecified: Secondary | ICD-10-CM | POA: Diagnosis not present

## 2019-09-27 DIAGNOSIS — Y95 Nosocomial condition: Secondary | ICD-10-CM | POA: Diagnosis present

## 2019-09-27 DIAGNOSIS — T8781 Dehiscence of amputation stump: Secondary | ICD-10-CM | POA: Diagnosis not present

## 2019-09-27 DIAGNOSIS — A419 Sepsis, unspecified organism: Secondary | ICD-10-CM

## 2019-09-27 DIAGNOSIS — D631 Anemia in chronic kidney disease: Secondary | ICD-10-CM | POA: Diagnosis not present

## 2019-09-27 DIAGNOSIS — Z7189 Other specified counseling: Secondary | ICD-10-CM

## 2019-09-27 DIAGNOSIS — Z66 Do not resuscitate: Secondary | ICD-10-CM | POA: Diagnosis present

## 2019-09-27 DIAGNOSIS — N189 Chronic kidney disease, unspecified: Secondary | ICD-10-CM | POA: Diagnosis present

## 2019-09-27 DIAGNOSIS — R627 Adult failure to thrive: Secondary | ICD-10-CM | POA: Diagnosis present

## 2019-09-27 DIAGNOSIS — I69354 Hemiplegia and hemiparesis following cerebral infarction affecting left non-dominant side: Secondary | ICD-10-CM | POA: Diagnosis not present

## 2019-09-27 DIAGNOSIS — J189 Pneumonia, unspecified organism: Secondary | ICD-10-CM | POA: Diagnosis not present

## 2019-09-27 DIAGNOSIS — Z89612 Acquired absence of left leg above knee: Secondary | ICD-10-CM

## 2019-09-27 DIAGNOSIS — Z7902 Long term (current) use of antithrombotics/antiplatelets: Secondary | ICD-10-CM

## 2019-09-27 DIAGNOSIS — Z681 Body mass index (BMI) 19 or less, adult: Secondary | ICD-10-CM | POA: Diagnosis not present

## 2019-09-27 DIAGNOSIS — E8770 Fluid overload, unspecified: Secondary | ICD-10-CM | POA: Diagnosis not present

## 2019-09-27 DIAGNOSIS — J9601 Acute respiratory failure with hypoxia: Secondary | ICD-10-CM | POA: Diagnosis present

## 2019-09-27 DIAGNOSIS — Z886 Allergy status to analgesic agent status: Secondary | ICD-10-CM

## 2019-09-27 DIAGNOSIS — Z89431 Acquired absence of right foot: Secondary | ICD-10-CM | POA: Diagnosis not present

## 2019-09-27 DIAGNOSIS — N2581 Secondary hyperparathyroidism of renal origin: Secondary | ICD-10-CM | POA: Diagnosis present

## 2019-09-27 DIAGNOSIS — M1990 Unspecified osteoarthritis, unspecified site: Secondary | ICD-10-CM | POA: Diagnosis present

## 2019-09-27 DIAGNOSIS — E43 Unspecified severe protein-calorie malnutrition: Secondary | ICD-10-CM | POA: Diagnosis present

## 2019-09-27 DIAGNOSIS — I953 Hypotension of hemodialysis: Secondary | ICD-10-CM | POA: Diagnosis present

## 2019-09-27 DIAGNOSIS — I959 Hypotension, unspecified: Secondary | ICD-10-CM | POA: Diagnosis not present

## 2019-09-27 DIAGNOSIS — Z79899 Other long term (current) drug therapy: Secondary | ICD-10-CM

## 2019-09-27 DIAGNOSIS — R52 Pain, unspecified: Secondary | ICD-10-CM

## 2019-09-27 DIAGNOSIS — I6990 Unspecified sequelae of unspecified cerebrovascular disease: Secondary | ICD-10-CM | POA: Diagnosis not present

## 2019-09-27 DIAGNOSIS — R54 Age-related physical debility: Secondary | ICD-10-CM | POA: Diagnosis present

## 2019-09-27 DIAGNOSIS — Z8711 Personal history of peptic ulcer disease: Secondary | ICD-10-CM

## 2019-09-27 LAB — BLOOD GAS, ARTERIAL
Acid-Base Excess: 0.3 mmol/L (ref 0.0–2.0)
Bicarbonate: 25 mmol/L (ref 20.0–28.0)
FIO2: 100
O2 Saturation: 99.6 %
Patient temperature: 37
pCO2 arterial: 33.7 mmHg (ref 32.0–48.0)
pH, Arterial: 7.461 — ABNORMAL HIGH (ref 7.350–7.450)
pO2, Arterial: 248 mmHg — ABNORMAL HIGH (ref 83.0–108.0)

## 2019-09-27 LAB — SARS CORONAVIRUS 2 BY RT PCR (HOSPITAL ORDER, PERFORMED IN ~~LOC~~ HOSPITAL LAB): SARS Coronavirus 2: NEGATIVE

## 2019-09-27 LAB — CORTISOL: Cortisol, Plasma: 54.9 ug/dL

## 2019-09-27 LAB — COMPREHENSIVE METABOLIC PANEL
ALT: 6 U/L (ref 0–44)
AST: 26 U/L (ref 15–41)
Albumin: 2.1 g/dL — ABNORMAL LOW (ref 3.5–5.0)
Alkaline Phosphatase: 70 U/L (ref 38–126)
Anion gap: 14 (ref 5–15)
BUN: 26 mg/dL — ABNORMAL HIGH (ref 8–23)
CO2: 25 mmol/L (ref 22–32)
Calcium: 8.5 mg/dL — ABNORMAL LOW (ref 8.9–10.3)
Chloride: 99 mmol/L (ref 98–111)
Creatinine, Ser: 2.76 mg/dL — ABNORMAL HIGH (ref 0.61–1.24)
GFR calc Af Amer: 25 mL/min — ABNORMAL LOW (ref >60)
GFR calc non Af Amer: 21 mL/min — ABNORMAL LOW (ref >60)
Glucose, Bld: 97 mg/dL (ref 70–99)
Potassium: 3.2 mmol/L — ABNORMAL LOW (ref 3.5–5.1)
Sodium: 138 mmol/L (ref 135–145)
Total Bilirubin: 1.6 mg/dL — ABNORMAL HIGH (ref 0.3–1.2)
Total Protein: 6.7 g/dL (ref 6.5–8.1)

## 2019-09-27 LAB — I-STAT CHEM 8, ED
BUN: 23 mg/dL (ref 8–23)
Calcium, Ion: 1.16 mmol/L (ref 1.15–1.40)
Chloride: 101 mmol/L (ref 98–111)
Creatinine, Ser: 2.8 mg/dL — ABNORMAL HIGH (ref 0.61–1.24)
Glucose, Bld: 85 mg/dL (ref 70–99)
HCT: 33 % — ABNORMAL LOW (ref 39.0–52.0)
Hemoglobin: 11.2 g/dL — ABNORMAL LOW (ref 13.0–17.0)
Potassium: 3.2 mmol/L — ABNORMAL LOW (ref 3.5–5.1)
Sodium: 137 mmol/L (ref 135–145)
TCO2: 27 mmol/L (ref 22–32)

## 2019-09-27 LAB — PROCALCITONIN: Procalcitonin: 7.72 ng/mL

## 2019-09-27 LAB — MRSA PCR SCREENING: MRSA by PCR: POSITIVE — AB

## 2019-09-27 LAB — CBC
HCT: 27.9 % — ABNORMAL LOW (ref 39.0–52.0)
Hemoglobin: 8.6 g/dL — ABNORMAL LOW (ref 13.0–17.0)
MCH: 20.6 pg — ABNORMAL LOW (ref 26.0–34.0)
MCHC: 30.8 g/dL (ref 30.0–36.0)
MCV: 66.9 fL — ABNORMAL LOW (ref 80.0–100.0)
Platelets: 353 10*3/uL (ref 150–400)
RBC: 4.17 MIL/uL — ABNORMAL LOW (ref 4.22–5.81)
RDW: 24.3 % — ABNORMAL HIGH (ref 11.5–15.5)
WBC: 40.3 10*3/uL — ABNORMAL HIGH (ref 4.0–10.5)
nRBC: 0.1 % (ref 0.0–0.2)

## 2019-09-27 LAB — PROTIME-INR
INR: 1.4 — ABNORMAL HIGH (ref 0.8–1.2)
Prothrombin Time: 16.2 seconds — ABNORMAL HIGH (ref 11.4–15.2)

## 2019-09-27 LAB — LACTIC ACID, PLASMA
Lactic Acid, Venous: 3.4 mmol/L (ref 0.5–1.9)
Lactic Acid, Venous: 1.5 mmol/L (ref 0.5–1.9)
Lactic Acid, Venous: 0.9 mmol/L (ref 0.5–1.9)

## 2019-09-27 LAB — APTT: aPTT: 36 seconds (ref 24–36)

## 2019-09-27 MED ORDER — IOHEXOL 350 MG/ML SOLN
100.0000 mL | Freq: Once | INTRAVENOUS | Status: AC | PRN
  Administered 2019-09-27: 100 mL via INTRAVENOUS

## 2019-09-27 MED ORDER — HYDROCORTISONE NA SUCCINATE PF 100 MG IJ SOLR
100.0000 mg | Freq: Three times a day (TID) | INTRAMUSCULAR | Status: DC
  Administered 2019-09-27 – 2019-09-28 (×3): 100 mg via INTRAVENOUS
  Filled 2019-09-27 (×3): qty 2

## 2019-09-27 MED ORDER — MIDAZOLAM HCL 2 MG/2ML IJ SOLN
2.0000 mg | Freq: Once | INTRAMUSCULAR | Status: AC
  Administered 2019-09-27: 2 mg via INTRAVENOUS
  Filled 2019-09-27: qty 2

## 2019-09-27 MED ORDER — SODIUM CHLORIDE 0.9 % IV SOLN
500.0000 mg | INTRAVENOUS | Status: DC
  Administered 2019-09-27: 500 mg via INTRAVENOUS
  Filled 2019-09-27: qty 500

## 2019-09-27 MED ORDER — VANCOMYCIN HCL IN DEXTROSE 1-5 GM/200ML-% IV SOLN
1000.0000 mg | Freq: Once | INTRAVENOUS | Status: AC
  Administered 2019-09-27: 1000 mg via INTRAVENOUS
  Filled 2019-09-27: qty 200

## 2019-09-27 MED ORDER — FLUMAZENIL 0.5 MG/5ML IV SOLN
0.2000 mg | Freq: Once | INTRAVENOUS | Status: AC
  Administered 2019-09-27: 0.2 mg via INTRAVENOUS
  Filled 2019-09-27: qty 5

## 2019-09-27 MED ORDER — VANCOMYCIN HCL IN DEXTROSE 500-5 MG/100ML-% IV SOLN
500.0000 mg | INTRAVENOUS | Status: DC
  Filled 2019-09-27 (×2): qty 100

## 2019-09-27 MED ORDER — SODIUM CHLORIDE 0.9 % IV SOLN
2.0000 g | INTRAVENOUS | Status: DC
  Administered 2019-09-27: 2 g via INTRAVENOUS
  Filled 2019-09-27: qty 2

## 2019-09-27 MED ORDER — ACETAMINOPHEN 325 MG PO TABS: 650.0000 mg | ORAL_TABLET | Freq: Four times a day (QID) | ORAL | Status: DC | PRN

## 2019-09-27 MED ORDER — LACTATED RINGERS IV BOLUS (SEPSIS)
250.0000 mL | Freq: Once | INTRAVENOUS | Status: AC
  Administered 2019-09-27: 250 mL via INTRAVENOUS

## 2019-09-27 MED ORDER — NOREPINEPHRINE 4 MG/250ML-% IV SOLN: 0.0000 ug/min | INTRAVENOUS | Status: DC

## 2019-09-27 MED ORDER — HEPARIN SODIUM (PORCINE) 5000 UNIT/ML IJ SOLN
5000.0000 [IU] | Freq: Three times a day (TID) | INTRAMUSCULAR | Status: DC
  Administered 2019-09-27 – 2019-09-28 (×2): 5000 [IU] via SUBCUTANEOUS
  Filled 2019-09-27 (×2): qty 1

## 2019-09-27 MED ORDER — SODIUM CHLORIDE 0.9 % IV SOLN
2.0000 g | INTRAVENOUS | Status: DC
  Administered 2019-09-27: 2 g via INTRAVENOUS
  Filled 2019-09-27: qty 20

## 2019-09-27 MED ORDER — LACTATED RINGERS IV BOLUS (SEPSIS)
1000.0000 mL | Freq: Once | INTRAVENOUS | Status: AC
  Administered 2019-09-27: 1000 mL via INTRAVENOUS

## 2019-09-27 MED ORDER — ACETAMINOPHEN 650 MG RE SUPP: 650.0000 mg | Freq: Four times a day (QID) | RECTAL | Status: DC | PRN

## 2019-09-27 MED ORDER — LACTATED RINGERS IV BOLUS (SEPSIS)
500.0000 mL | Freq: Once | INTRAVENOUS | Status: AC
  Administered 2019-09-27: 500 mL via INTRAVENOUS

## 2019-09-27 NOTE — ED Notes (Signed)
CRITICAL VALUE ALERT  Critical Value:  Lactic 3.4  Date & Time Notied:  09/01/2019, 1159  Provider Notified: Dr. Sabra Heck  Orders Received/Actions taken: no new orders received.

## 2019-09-27 NOTE — ED Provider Notes (Signed)
[facility] Provider Note   CSN: [id number] Arrival date & time: 09/18/2019  1020     History Chief Complaint  Patient presents with  . Shortness of Breath    [name]. is a 79 y.o. male.  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  He has a known history of a cardiomyopathy with an ejection fraction which was normal in 2017.  He is on dialysis Monday Wednesday and Friday, was at dialysis today when he developed what was described shortness of breath by the paramedics.  The patient has significant difficulty speaking secondary to prior stroke, reportedly the patient was complaining of increasing shortness of breath despite increasing oxygen requirement and finally was asking to go to the hospital.  It is unclear exactly why he wanted to, and is very difficult to understand his speech.  The patient has significant swelling of his left arm, this is chronic, he also has a dialysis catheter in his right chest which is currently being used for dialysis.  It is unclear how much dialysis he had today if any due to his shortness of breath.  The patient was placed on a nonrebreather by paramedics prehospital, no oxygenation was able to be obtained by an pulse oximetry meter thus we are unsure what his prehospital oxygenation was  Level 5 caveat applies secondary to the patient's inability to communicate well verbally  Past Medical History:  Diagnosis Date  . Acute osteomyelitis of left foot ([place]) 07/21/2019  . Anxiety   . Aortic stenosis   . Arthritis   . Closed avulsion fracture of right hip ([place]) 06/30/2019    . ESRD (end stage renal disease) on dialysis [facility])    M/W/F at [facility] in [place]  . Essential hypertension   . Gangrene of left foot ([place]) 06/04/2018  . Gangrene of right foot ([place])   . Gastric ulcer 2004  . GI bleed   . GI bleeding 08/2019  . History of blood transfusion   . History of cardiomyopathy    LVEF normal as of February 2017  . History of gastric ulcer   . History of stroke    Left side weakness  . Iron deficiency anemia   . Osteomyelitis ([place])   . Peripheral vascular disease [facility])     Patient Active Problem List   Diagnosis Date Noted  . Dehiscence of amputation stump ([place])   . Chronic hypotension   . SVT (supraventricular tachycardia) ([place])   . Ventricular ectopy   . Duodenal ulcer   . Palliative care encounter   . DNR (do not resuscitate) discussion   . Goals of care, counseling/discussion   . GI bleeding 09/03/2019  . Bacteremia due to Enterococcus 08/11/2019  . Postoperative wound infection 08/10/2019  . Prolonged QT interval 08/10/2019  . Acute metabolic encephalopathy 42/70/6237  . Thrombocytosis ([place]) 08/10/2019  . Fall   . Closed intertrochanteric fracture of right hip ([place]) 06/30/2019  . Hip fracture ([place]) 06/30/2019  . Severe protein-calorie malnutrition ([place]) 06/18/2018  . Severe sepsis ([place]) 06/12/2018  . Leukocytosis 06/12/2018  . MRSA (methicillin resistant staph aureus) culture positive 06/12/2018  .  Essential hypertension 06/12/2018  . S/P transmetatarsal amputation of foot, right ([place]) 05/09/2017  . PVD (peripheral vascular disease) ([place]) 04/15/2017  . Nonischemic cardiomyopathy ([place]) 08/15/2015  . Aortic stenosis 08/15/2015  . Moderate aortic stenosis 07/12/2015  . 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 CAD- 40-50% LAD at cath 07/11/15 07/11/2015  . Aspirin intolerance 07/11/2015  . Abnormal stress test   . ESRD on dialysis ([place]) 05/30/2015  . CTS (carpal tunnel syndrome) 02/28/2015  . PVD of LE - Dr Oneida Alar follows  08/20/2012  . Midfoot skin ulcer, right, limited to breakdown of skin ([place]) 01/16/2012  . Encounter for central line placement 10/08/2011  . Melena 10/08/2011  . Other complications due to renal dialysis device, implant, and graft 09/24/2011    Past Surgical History:  Procedure Laterality Date  . ABDOMINAL AORTAGRAM N/A 01/24/2012   Procedure: ABDOMINAL Maxcine Ham;  Surgeon: Elam Dutch, MD;  Location: [facility] CATH LAB;  Service: Cardiovascular;  Laterality: N/A;  . ABDOMINAL AORTOGRAM W/LOWER EXTREMITY N/A 04/15/2017   Procedure: ABDOMINAL AORTOGRAM W/LOWER EXTREMITY;  Surgeon: Angelia Mould, MD;  Location: [place] CV LAB;  Service: Cardiovascular;  Laterality: N/A;  . AMPUTATION Right 05/09/2017   Procedure: RIGHT TRANSMETATARSAL AMPUTATION;  Surgeon: [name], MD;  Location: [place];  Service: Orthopedics;  Laterality: Right;  . AMPUTATION Right 06/13/2018   Procedure: AMPUTATION RIGHT LONG FINGER;  Surgeon: Dayna Barker, MD;  Location: [place];  Service: Plastics;  Laterality: Right;  . AMPUTATION Left 07/21/2019   Procedure: LEFT TRANSMETATARSAL AMPUTATION;  Surgeon: [name], MD;  Location: [place];  Service: Orthopedics;  Laterality: Left;  . AMPUTATION Left 08/13/2019   Procedure: AMPUTATION BELOW KNEE;  Surgeon: [name], MD;  Location: [place];  Service: Orthopedics;  Laterality: Left;  . AMPUTATION Left 09/08/2019   Procedure: LEFT ABOVE KNEE AMPUTATION;  Surgeon: [name], MD;  Location: [place];  Service: Orthopedics;  Laterality: Left;  . ARTERIOVENOUS GRAFT PLACEMENT Right right arm  . AV FISTULA PLACEMENT Left 08/31/2015   Procedure: ARTERIOVENOUS (AV) FISTULA CREATION- LEFT RADIOCEPHALIC;  Surgeon: Mal Misty, MD;  Location: [place];  Service: Vascular;  Laterality: Left;  . AV FISTULA PLACEMENT Right 02/18/2017   Procedure: INSERTION OF ARTERIOVENOUS (AV) GORE-TEX GRAFT  RIGHT UPPER ARM;  Surgeon: Conrad Dunlap, MD;  Location: [place];  Service:  Vascular;  Laterality: Right;  . AV FISTULA PLACEMENT Left 12/01/2018   Procedure: INSERTION OF ARTERIOVENOUS (AV) GORE-TEX GRAFT ARM;  Surgeon: Angelia Mould, MD;  Location: [place];  Service: Vascular;  Laterality: Left;  . AV FISTULA PLACEMENT Left 02/09/2019   Procedure: INSERTION OF ARTERIOVENOUS (AV) GORE-TEX GRAFT LEFT THIGH;  Surgeon: Waynetta Sandy, MD;  Location: [place];  Service: Vascular;  Laterality: Left;  . BASCILIC VEIN TRANSPOSITION Left 12/19/2015   Procedure: FIRST STAGE BRACHIAL VEIN TRANSPOSITION;  Surgeon: Conrad Walth[place]ville, MD;  Location: [place];  Service: Vascular;  Laterality: Left;  . BASCILIC VEIN TRANSPOSITION Left 02/08/2016   Procedure: SECOND STAGE BRACHIAL VEIN TRANSPOSITION;  Surgeon: Conrad Pigeon, MD;  Location: [place];  Service: Vascular;  Laterality: Left;  . BIOPSY  09/04/2019   Procedure: BIOPSY;  Surgeon: Thornton Park, MD;  Location: [place];  Service: Gastroenterology;;  . Kathleen Argue STUDY  08/16/2019   Procedure: BUBBLE STUDY;  Surgeon: Elouise Munroe, MD;  Location: [place];  Service: Cardiovascular;;  . CARDIAC CATHETERIZATION N/A 07/11/2015   Procedure: Left Heart Cath and  Coronary Angiography;  Surgeon: Troy Sine, MD;  Location: [place] CV LAB;  Service: Cardiovascular;  Laterality: N/A;  . Carpel Tunnel Left Dec. 22, 2016  . CENTRAL LINE INSERTION N/A 09/03/2019   Procedure: CENTRAL LINE INSERTION;  Surgeon: Juanito Doom, MD;  Location: [place];  Service: Cardiopulmonary;  Laterality: N/A;  . CHOLECYSTECTOMY    . COLONOSCOPY  2004   Dr. Irving Shows, left sided diverticula and cecal polyp, path unknown  . COLONOSCOPY  10/29/2011   Procedure: COLONOSCOPY;  Surgeon: Daneil Dolin, MD;  [facility];  Service: Endoscopy;  Laterality: N/A;  10:15  . ESOPHAGOGASTRODUODENOSCOPY  11/2002   Dr. Gala Romney, erosive reflux esophagitis, multiple gastric ulcer and antral/bulbar erosions. Serologies positive for H.Pylori  and was treated  . ESOPHAGOGASTRODUODENOSCOPY  11/20014   Dr. Gala Romney, small hh only, ulcers healed  . ESOPHAGOGASTRODUODENOSCOPY  09/21/2011   Dr Trevor Iha [facility], antral erosions, ?early GAVE  . ESOPHAGOGASTRODUODENOSCOPY (EGD) WITH PROPOFOL N/A 09/04/2019   Procedure: ESOPHAGOGASTRODUODENOSCOPY (EGD) WITH PROPOFOL;  Surgeon: Thornton Park, MD;  Location: [place];  Service: Gastroenterology;  Laterality: N/A;  . FISTULOGRAM Left 12/10/2016   Procedure: THROMBECTOMY OF LEFT ARM ARTERIOVENOUS FISTULA;  Surgeon: Waynetta Sandy, MD;  Location: [place];  Service: Vascular;  Laterality: Left;  . INSERTION OF DIALYSIS CATHETER Left 12/10/2016   Procedure: INSERTION OF TUNNELED DIALYSIS CATHETER;  Surgeon: Waynetta Sandy, MD;  Location: [place];  Service: Vascular;  Laterality: Left;  . INSERTION OF DIALYSIS CATHETER Right 06/09/2018   Procedure: INSERTION OF DIALYSIS CATHETER Right subclavian;  Surgeon: Angelia Mould, MD;  Location: [place];  Service: Vascular;  Laterality: Right;  . IR DIALY SHUNT INTRO [place] W/IMG RIGHT Right 01/01/2018  . IR FLUORO GUIDE CV LINE LEFT  09/10/2019  . IR FLUORO GUIDE CV LINE RIGHT  09/16/2019  . IR GENERIC HISTORICAL  07/16/2016   IR REMOVAL TUN CV CATH W/O FL 07/16/2016 Saverio Danker, PA-C [facility]-INTERV RAD  . IR PTA ADDL CENTRAL DIALYSIS SEG THRU DIALY CIRCUIT RIGHT Right 10/21/2017  . IR REMOVAL TUN CV CATH W/O FL  05/12/2017  . IR THROMBECTOMY AV FISTULA W/THROMBOLYSIS/PTA INC/SHUNT/IMG RIGHT Right 10/21/2017  . IR THROMBECTOMY AV FISTULA W/THROMBOLYSIS/PTA INC/SHUNT/IMG RIGHT Right 11/27/2017  . IR US GUIDE VASC ACCESS LEFT  09/10/2019  . IR US GUIDE VASC ACCESS RIGHT  10/21/2017  . IR US GUIDE VASC ACCESS RIGHT  11/27/2017  . IR US GUIDE VASC ACCESS RIGHT  01/01/2018  . IR US GUIDE VASC ACCESS RIGHT  09/16/2019  . LIGATION ARTERIOVENOUS GORTEX GRAFT Right 06/09/2018  . LIGATION ARTERIOVENOUS GORTEX GRAFT Right 06/09/2018   Procedure:  LIGATION ARTERIOVENOUS GORTEX GRAFT RIGHT ARM;  Surgeon: Angelia Mould, MD;  Location: [place];  Service: Vascular;  Laterality: Right;  . LIGATION ARTERIOVENOUS GORTEX GRAFT Left 12/31/2018   Procedure: LIGATION OF LEFT UPPER ARM ARTERIVENOUS GORTEX GRAFT, LEFT BRACHIAL ARTERY ENDARTERECTOMY WITH BOVINE PATCH ANGIOPLASTY;  Surgeon: Waynetta Sandy, MD;  Location: [place];  Service: Vascular;  Laterality: Left;  . LIGATION OF ARTERIOVENOUS  FISTULA Left 12/19/2015   Procedure: LIGATION OF RADIOCEPHALIC ARTERIOVENOUS  FISTULA;  Surgeon: Conrad Canfield, MD;  Location: [place];  Service: Vascular;  Laterality: Left;  Marland Kitchen MASS EXCISION Right 02/18/2017   Procedure: EXCISION OF RIGHT AXILLARY EPIDERMAL INCLUSION CYST;  Surgeon: Conrad Parkway, MD;  Location: [place];  Service: Vascular;  Laterality: Right;  . PERIPHERAL VASCULAR CATHETERIZATION N/A 12/14/2015   Procedure: Fistulagram;  Surgeon: Conrad Lester, MD;  Location: [place] CV LAB;  Service: Cardiovascular;  Laterality: N/A;  . SHOULDER SURGERY Right    fracture  . TEE WITHOUT CARDIOVERSION N/A 08/16/2019   Procedure: TRANSESOPHAGEAL ECHOCARDIOGRAM (TEE);  Surgeon: Elouise Munroe, MD;  Location: [place];  Service: Cardiovascular;  Laterality: N/A;  . ULTRASOUND GUIDANCE FOR VASCULAR ACCESS  04/15/2017   Procedure: Ultrasound Guidance For Vascular Access;  Surgeon: Angelia Mould, MD;  Location: [place] CV LAB;  Service: Cardiovascular;;  . UPPER EXTREMITY VENOGRAPHY Bilateral 12/17/2016   Procedure: Bilateral Upper Extremity Venography;  Surgeon: Serafina Mitchell, MD;  Location: [place] CV LAB;  Service: Cardiovascular;  Laterality: Bilateral;  . UPPER EXTREMITY VENOGRAPHY Left 11/03/2018   Procedure: UPPER EXTREMITY VENOGRAPHY;  Surgeon: Serafina Mitchell, MD;  Location: [place] CV LAB;  Service: Cardiovascular;  Laterality: Left;       Family History  Problem Relation Age of Onset  . Hypertension Mother   .  Colon cancer Neg Hx   . Liver disease Neg Hx     Social History   Tobacco Use  . Smoking status: Former Smoker    Years: 25.00    Quit date: 04/29/2004    Years since quitting: 15.4  . Smokeless tobacco: Former Systems developer    Types: Chew    Quit date: 01/16/1987  . Tobacco comment: quit 2006  Substance Use Topics  . Alcohol use: No  . Drug use: No    Home Medications Prior to Admission medications   Medication Sig Start Date End Date Taking? Authorizing Provider  acetaminophen (TYLENOL) 325 MG tablet Take 2 tablets (650 mg total) by mouth every 6 (six) hours as needed for mild pain (or Fever >/= 101). 09/18/19   Elgergawy, Silver Huguenin, MD  Amino Acids-Protein Hydrolys (FEEDING SUPPLEMENT, PRO-STAT SUGAR FREE 64,) LIQD Take 30 mLs by mouth in the morning and at bedtime.     [provider]  atorvastatin (LIPITOR) 40 MG tablet Take 40 mg by mouth daily.  11/03/15   [provider]  B Complex-C (B-COMPLEX WITH VITAMIN C) tablet Take 1 tablet by mouth daily.     [provider]  cholecalciferol (VITAMIN D3) 25 MCG (1000 UT) tablet Take 1,000 Units by mouth daily.     [provider]  cinacalcet (SENSIPAR) 60 MG tablet Take 60 mg by mouth at bedtime.     [provider]  clopidogrel (PLAVIX) 75 MG tablet Take 1 tablet (75 mg total) by mouth daily. Start on 5/25 09/22/19   Elgergawy, Silver Huguenin, MD  melatonin 3 MG TABS tablet Take 3 mg by mouth at bedtime.     [provider]  midodrine (PROAMATINE) 10 MG tablet Take 1 tablet (10 mg total) by mouth 3 (three) times daily. 09/20/19 10/20/19  Elgergawy, Silver Huguenin, MD  mirtazapine (REMERON) 7.5 MG tablet Take 7.5 mg by mouth at bedtime.     [provider]  Nutritional Supplements (FEEDING SUPPLEMENT, NEPRO CARB STEADY,) LIQD Take 237 mLs by mouth 2 (two) times daily between meals. Patient taking differently: Take 237 mLs by mouth in the morning and at bedtime.  07/22/19   Persons, Bevely Palmer, PA    oxyCODONE (OXY IR/ROXICODONE) 5 MG immediate release tablet Take 1 tablet (5 mg total) by mouth every 6 (six) hours as needed for severe pain (5 mg mild pain/ 10 mg moderate pain). 09/23/19   Ghimire, Henreitta Leber, MD  pantoprazole (PROTONIX) 40 MG tablet  Take 1 tablet (40 mg total) by mouth 2 (two) times daily. 09/20/19   Elgergawy, Silver Huguenin, MD  sennosides-docusate sodium (SENOKOT-S) 8.6-50 MG tablet Take 1 tablet by mouth daily.     [provider]    Allergies    Aspirin  Review of Systems   Review of Systems  Unable to perform ROS: Acuity of condition    Physical Exam Updated Vital Signs BP (!) 88/51   Pulse 83   Temp 97.9 F (36.6 C) (Rectal)   Resp 17   Ht 1.651 m (5\' 5" )   Wt 52.2 kg   SpO2 (!) 81%   BMI 19.14 kg/m   Physical Exam Vitals and nursing note reviewed.  Constitutional:      Appearance: He is well-developed. He is ill-appearing. He is not diaphoretic.  HENT:     Head: Normocephalic and atraumatic.     Mouth/Throat:     Pharynx: No oropharyngeal exudate.  Eyes:     General: No scleral icterus.       Right eye: No discharge.        Left eye: No discharge.     Conjunctiva/sclera: Conjunctivae normal.     Pupils: Pupils are equal, round, and reactive to light.     Comments: Arcus senilis present  Neck:     Thyroid: No thyromegaly.     Vascular: No JVD.     Comments: No JVD Cardiovascular:     Rate and Rhythm: Normal rate and regular rhythm.     Heart sounds: Murmur present. No friction rub. No gallop.      Comments: Ectopy and heart murmur present Pulmonary:     Effort: Tachypnea present.     Breath sounds: No wheezing or rales.     Comments: Decreased breath sounds on the left compared to the right, percussion of the chest yields bilateral symmetrical sounds Chest:     Chest wall: No mass or tenderness.     Comments: Severe muscle wasting, cachectic Abdominal:     General: Bowel sounds are normal. There is no distension.     Palpations:  Abdomen is soft. There is no mass.     Tenderness: There is no abdominal tenderness.  Musculoskeletal:        General: No tenderness. Normal range of motion.     Cervical back: Normal range of motion and neck supple.     Comments: Right lower extremity with intact amputation of the midfoot on the right, left lower extremity with proximal above-the-knee amputation present, wound has a slight discharge and smell, otherwise wound is intact, staples present.  He does have swelling of the left upper extremity compared to all other extremities  Lymphadenopathy:     Cervical: No cervical adenopathy.  Skin:    General: Skin is warm and dry.     Findings: No erythema or rash.  Neurological:     Mental Status: He is alert.     Coordination: Coordination normal.     Comments: The patient has garbled speech, it is difficult to hear what he is saying, he is able to follow commands by gripping bilaterally, raising both legs, no obvious facial droop.  He seems to have a slight left-sided weakness compared to the right  Psychiatric:        Behavior: Behavior normal.     ED Results / Procedures / Treatments   Labs (all labs ordered are listed, but only abnormal results are displayed) Labs Reviewed  CBC - Abnormal;  Notable for the following components:      Result Value   WBC 40.3 (*)    RBC 4.17 (*)    Hemoglobin 8.6 (*)    HCT 27.9 (*)    MCV 66.9 (*)    MCH 20.6 (*)    RDW 24.3 (*)    All other components within normal limits  COMPREHENSIVE METABOLIC PANEL - Abnormal; Notable for the following components:   Potassium 3.2 (*)    BUN 26 (*)    Creatinine, Ser 2.76 (*)    Calcium 8.5 (*)    Albumin 2.1 (*)    Total Bilirubin 1.6 (*)    GFR calc non Af Amer 21 (*)    GFR calc Af Amer 25 (*)    All other components within normal limits  BLOOD GAS, ARTERIAL - Abnormal; Notable for the following components:   pH, Arterial 7.461 (*)    pO2, Arterial 248 (*)    All other components within  normal limits  LACTIC ACID, PLASMA - Abnormal; Notable for the following components:   Lactic Acid, Venous 3.4 (*)    All other components within normal limits  PROTIME-INR - Abnormal; Notable for the following components:   Prothrombin Time 16.2 (*)    INR 1.4 (*)    All other components within normal limits  I-STAT CHEM 8, ED - Abnormal; Notable for the following components:   Potassium 3.2 (*)    Creatinine, Ser 2.80 (*)    Hemoglobin 11.2 (*)    HCT 33.0 (*)    All other components within normal limits  CULTURE, BLOOD (ROUTINE X 2)  CULTURE, BLOOD (ROUTINE X 2)  URINE CULTURE  SARS CORONAVIRUS 2 BY RT PCR (HOSPITAL ORDER, [place] [facility])  APTT  URINALYSIS, ROUTINE W REFLEX MICROSCOPIC    EKG None  Radiology DG Chest Port 1 View  Result Date: 09/19/2019 CLINICAL DATA:  Shortness of breath EXAM: PORTABLE CHEST 1 VIEW COMPARISON:  Sep 10, 2019 FINDINGS: There is airspace opacity in the left base. There is an area of focal air in either in or overlying this region. Lungs elsewhere clear. Heart size and pulmonary vascularity are normal. No adenopathy. Central catheter tip is in the superior vena cava. No pneumothorax evident. There is aortic atherosclerosis. There is arthropathy in the shoulders with superior migration of the humeral heads. There is a rod in the proximal right humerus. IMPRESSION: Airspace opacity concerning for potential degree of pneumonia left base. Areas either in or overlying the left base region. Question elevation of the left hemidiaphragm with air in this area. This area is less than optimally visualized on this study. It may be prudent to obtain a repeat study with repositioning to better assess this region. Alternatively, CT may be helpful for better anatomic delineation. Heart size within normal limits. Central catheter tip in superior vena cava. Aortic Atherosclerosis (ICD10-I70.0). Electronically Signed   By: Lowella Grip III M.D.    On: 09/14/2019 11:07   DG HIP UNILAT WITH PELVIS 2-3 VIEWS LEFT  Result Date: 09/01/2019 CLINICAL DATA:  Pain EXAM: DG HIP (WITH OR WITHOUT PELVIS) 2-3V LEFT COMPARISON:  None. FINDINGS: Frontal pelvis as well as frontal and lateral left hip images were obtained. Patient has had previous amputation at the proximal femoral diaphysis level on the left. There is air near the stump in this area with skin staples, likely due to recent postoperative change. No bony destruction. No acute fracture or dislocation is appreciable.  There is diffuse osteoporosis. There is moderate symmetric narrowing of each hip joint. There is extensive aortic atherosclerosis as well as multiple iliac and femoral arterial foci of atherosclerotic calcification. IMPRESSION: 1. Diffuse osteoporosis. 2. No acute fracture or dislocation. 3. Status post amputation of the proximal left femur with postoperative changes in this area. No bony destruction evident. 4. Symmetric narrowing each hip joint. 5. 5. Widespread aortic, iliac, and femoral artery atherosclerotic calcifications bilaterally. Electronically Signed   By: Lowella Grip III M.D.   On: 09/06/2019 11:09    Procedures .Critical Care Performed by: Noemi Chapel, MD Authorized by: Noemi Chapel, MD   Critical care provider statement:    Critical care time (minutes):  35   Critical care time was exclusive of:  Separately billable procedures and treating other patients and teaching time   Critical care was necessary to treat or prevent imminent or life-threatening deterioration of the following conditions:  Sepsis and respiratory failure   Critical care was time spent personally by me on the following activities:  Blood draw for specimens, development of treatment plan with patient or surrogate, discussions with consultants, evaluation of patient's response to treatment, examination of patient, obtaining history from patient or surrogate, ordering and performing treatments  and interventions, ordering and review of laboratory studies, ordering and review of radiographic studies, pulse oximetry, re-evaluation of patient's condition and review of old charts Comments:       Ultrasound ED Peripheral IV (Provider)  Date/Time: 08/31/2019 1:01 PM Performed by: Noemi Chapel, MD Authorized by: Noemi Chapel, MD   Procedure details:    Indications: hypotension, multiple failed IV attempts and poor IV access     Skin Prep: chlorhexidine gluconate     Location: Right upper arm above AC.   Angiocath:  20 G   Bedside Ultrasound Guided: Yes     Images: archived     Patient tolerated procedure without complications: Yes     Dressing applied: Yes   Comments:         (including critical care time)  Medications Ordered in ED Medications  cefTRIAXone (ROCEPHIN) 2 g in sodium chloride 0.9 % 100 mL IVPB (has no administration in time range)  azithromycin (ZITHROMAX) 500 mg in sodium chloride 0.9 % 250 mL IVPB (has no administration in time range)  lactated ringers bolus 1,000 mL (has no administration in time range)    And  lactated ringers bolus 500 mL (has no administration in time range)    And  lactated ringers bolus 250 mL (has no administration in time range)    ED Course  I have reviewed the triage vital signs and the nursing notes.  Pertinent labs & imaging results that were available during my care of the patient were reviewed by me and considered in my medical decision making (see chart for details).    MDM Rules/Calculators/A&P                      This patient has multiple abnormal findings but of utmost concern is his feeling of shortness of breath prehospital.  He does not when I asked him if he is short of breath, he has an abnormal lung exam but it is unclear whether he is hypoxic or not as we are unable to get a good waveform on the monitor.  His heart rate seems to be in the 80-100 range with some ectopy.  He has edema of his left arm which is  unclear how chronic  that is but it appears very chronic.  His left leg has been amputated above the knee and the amputation site appears overall clean with a small amount of discharge.  He is known to have sacral decubitus ulcers which we will have to evaluate.  We will get labs to make sure he is not hyperkalemic, I do not think he had much of any dialysis.  The patient definitely appears ill and is mildly tachypneic.  The patient has an abnormal white blood cell count of over 40,000, he has a creatinine of 2.8 not surprising since he is on dialysis, his potassium was 3.2, blood cultures were obtained, lactic acid was over 3.4.  It is difficult at this time to determine whether this patient's findings are related to sepsis or some other cause.  It would not be unusual for a lactic acid to be this high in someone who has missed dialysis, however sepsis could also cause this as could pulmonary embolism or other causes of shortness of breath.  As the patient is unable to give [place] any more information he will be covered for antibiotics, I have placed a second IV in his right upper extremity so that we can facilitate an angiogram to look for pulmonary embolism given his recent surgery and new onset shortness of breath.  I discussed his care with the daughter, [name], who was at the bedside and agrees that the patient did not want to be placed on life support as far as intubation however he would want to have electricity and cardioversion or defibrillation if needed, brief CPR if needed.  This patient is critically ill, he will need to go to a higher level of care  I discussed the care at 1:00 PM with the hospitalist Dr. Roderic Palau who agrees to but the patient.  Final Clinical Impression(s) / ED Diagnoses Final diagnoses:  Severe sepsis ([place])  Respiratory distress      Noemi Chapel, MD 10/01/19 2024

## 2019-09-27 NOTE — Sepsis Progress Note (Signed)
Notified Dr Roderic Palau of need to order a repeat lactic acid.  This pt has been a very difficult stick for Labs and IV start. He has met fluid resucitation and antibiotic guidelines. Initial LA =3.4. Dr Roderic Palau states he will put a repeat Lactic acid in his admission orders.

## 2019-09-27 NOTE — ED Triage Notes (Signed)
Pt from [facility] in [place]. 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  78% on non rebreather. CBG 79.

## 2019-09-27 NOTE — Progress Notes (Signed)
Pharmacy Antibiotic Note  [name]. is a 79 y.o. male admitted on 09/22/2019 with HCAP/ pneumonia.  Pharmacy has been consulted for Vancomycin and cefepime dosing.  Plan: Vancomycin 1gm IV loading dose, then 500mg  IV after HD on MWF Cefepime 2gm IV with HD MWF F/U cxs and clinical progress Monitor V/S, labs, levels as indicated  Height: 5\' 5"  (165.1 cm) Weight: 52.2 kg (115 lb) IBW/kg (Calculated) : 61.5  Temp (24hrs), Avg:97.9 F (36.6 C), Min:97.9 F (36.6 C), Max:97.9 F (36.6 C)  Recent Labs  Lab 09/21/19 0243 09/22/19 0500 09/23/19 0508 09/23/19 0639 09/08/2019 1129 09/07/2019 1144  WBC 27.6* 29.3* 35.1*  --  40.3*  --   CREATININE 2.64* 2.83*  --  2.09* 2.76* 2.80*  LATICACIDVEN  --   --   --   --  3.4*  --     Estimated Creatinine Clearance: 16.6 mL/min (A) (by C-G formula based on SCr of 2.8 mg/dL (H)).    Allergies  Allergen Reactions  . Aspirin Other (See Comments)    Causes internal bleeding  History of ulcers    Antimicrobials this admission: Vancomycin 5/31>>  Cefepime 5/31 >>  Ceftriaxone 2gm IV and Azithromycin 500mg  IV x 1 dose each 5/31 in ED  Dose adjustments this admission: prn  Microbiology results: 5/31 BCx: pending 5/31 UCx: TBC 5/31 SARS-2 CV pending   MRSA PCR:  Thank you for allowing pharmacy to be a part of this patient's care.  Isac Sarna, BS [place], [place] Clinical Pharmacist Pager [phone] 09/24/2019 4:41 PM

## 2019-09-27 NOTE — ED Notes (Signed)
Pt taken to CT.

## 2019-09-27 NOTE — H&P (Addendum)
History and Physical    [name] VZD:[id number] DOB: 1940/10/09 DOA: 09/15/2019  PCP: [name], MD  Patient coming from: Skilled nursing facility  I have personally briefly reviewed patient's old medical records in [place]  Chief Complaint: Shortness of breath  HPI: [name]. is a 79 y.o. 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  He was discharged to skilled nursing facility.  Patient had presented to the dialysis center today for routine dialysis when he reported that he was short of breath.  History is somewhat limited since patient cannot participate.  History is obtained from his daughter.  At the dialysis center, supplemental oxygen was applied, but he continued to be short of breath.  He was sent to the ER for evaluation.  Upon arrival to the emergency room, he was noted to be hypotensive, hypoxic.  Accurate vital signs were proved to be difficult since pulse ox did not have accurate waveform and blood pressure cuff had to be placed on his leg.  Initial blood gas showed pH of 7.46, PCO2 of 33, PO2 of 248 on 100% oxygen.  CBC showed WBC count of 40.3, up from 35.1, from 4 days earlier.  Potassium 3.2, BUN of 26, lactic acid of 3.4.  CT angiogram of the chest was negative for pulmonary embolism, but did show bilateral pleural effusions and left-sided pneumonia.  Patient was bolused with IV fluids per sepsis protocol.  He received intravenous antibiotics.  He was referred for admission.  Review of Systems: Unable to assess due to mental status   Past Medical History:  Diagnosis Date  . Acute osteomyelitis of left foot ([place]) 07/21/2019  . Anxiety     . Aortic stenosis   . Arthritis   . Closed avulsion fracture of right hip ([place]) 06/30/2019  . ESRD (end stage renal disease) on dialysis [facility])    M/W/F at [facility] in [place]  . Essential hypertension   . Gangrene of left foot ([place]) 06/04/2018  . Gangrene of right foot ([place])   . Gastric ulcer 2004  . GI bleed   . GI bleeding 08/2019  . History of blood transfusion   . History of cardiomyopathy    LVEF normal as of February 2017  . History of gastric ulcer   . History of stroke    Left side weakness  . Iron deficiency anemia   . Osteomyelitis ([place])   . Peripheral vascular disease [facility]     Past Surgical History:  Procedure Laterality Date  . ABDOMINAL AORTAGRAM N/A 01/24/2012   Procedure: ABDOMINAL Maxcine Ham;  Surgeon: Elam Dutch, MD;  Location: [facility] CATH LAB;  Service: Cardiovascular;  Laterality: N/A;  . ABDOMINAL AORTOGRAM W/LOWER EXTREMITY N/A 04/15/2017   Procedure: ABDOMINAL AORTOGRAM W/LOWER EXTREMITY;  Surgeon: Angelia Mould, MD;  Location: [place] CV LAB;  Service: Cardiovascular;  Laterality: N/A;  . AMPUTATION Right 05/09/2017   Procedure: RIGHT TRANSMETATARSAL AMPUTATION;  Surgeon: [name], MD;  Location: [place];  Service: Orthopedics;  Laterality: Right;  . AMPUTATION Right 06/13/2018   Procedure: AMPUTATION RIGHT LONG FINGER;  Surgeon: Dayna Barker, MD;  Location: [place];  Service: Plastics;  Laterality: Right;  . AMPUTATION Left 07/21/2019   Procedure: LEFT TRANSMETATARSAL AMPUTATION;  Surgeon: Sharol Given,  Illene Regulus, MD;  Location: [place];  Service: Orthopedics;  Laterality: Left;  . AMPUTATION Left 08/13/2019   Procedure: AMPUTATION BELOW KNEE;  Surgeon: [name], MD;  Location: [place];  Service: Orthopedics;  Laterality: Left;  . AMPUTATION Left 09/08/2019   Procedure: LEFT ABOVE KNEE AMPUTATION;  Surgeon: [name], MD;  Location: [place];  Service: Orthopedics;  Laterality: Left;  . ARTERIOVENOUS GRAFT PLACEMENT Right right arm  . AV FISTULA  PLACEMENT Left 08/31/2015   Procedure: ARTERIOVENOUS (AV) FISTULA CREATION- LEFT RADIOCEPHALIC;  Surgeon: Mal Misty, MD;  Location: [place];  Service: Vascular;  Laterality: Left;  . AV FISTULA PLACEMENT Right 02/18/2017   Procedure: INSERTION OF ARTERIOVENOUS (AV) GORE-TEX GRAFT  RIGHT UPPER ARM;  Surgeon: Conrad [place], MD;  Location: [place];  Service: Vascular;  Laterality: Right;  . AV FISTULA PLACEMENT Left 12/01/2018   Procedure: INSERTION OF ARTERIOVENOUS (AV) GORE-TEX GRAFT ARM;  Surgeon: Angelia Mould, MD;  Location: [place];  Service: Vascular;  Laterality: Left;  . AV FISTULA PLACEMENT Left 02/09/2019   Procedure: INSERTION OF ARTERIOVENOUS (AV) GORE-TEX GRAFT LEFT THIGH;  Surgeon: Waynetta Sandy, MD;  Location: [place];  Service: Vascular;  Laterality: Left;  . BASCILIC VEIN TRANSPOSITION Left 12/19/2015   Procedure: FIRST STAGE BRACHIAL VEIN TRANSPOSITION;  Surgeon: Conrad [place], MD;  Location: [place];  Service: Vascular;  Laterality: Left;  . BASCILIC VEIN TRANSPOSITION Left 02/08/2016   Procedure: SECOND STAGE BRACHIAL VEIN TRANSPOSITION;  Surgeon: Conrad [place]ata, MD;  Location: [place];  Service: Vascular;  Laterality: Left;  . BIOPSY  09/04/2019   Procedure: BIOPSY;  Surgeon: Thornton Park, MD;  Location: [place];  Service: Gastroenterology;;  . Kathleen Argue STUDY  08/16/2019   Procedure: BUBBLE STUDY;  Surgeon: Elouise Munroe, MD;  Location: [place];  Service: Cardiovascular;;  . CARDIAC CATHETERIZATION N/A 07/11/2015   Procedure: Left Heart Cath and Coronary Angiography;  Surgeon: Troy Sine, MD;  Location: [place] CV LAB;  Service: Cardiovascular;  Laterality: N/A;  . Carpel Tunnel Left Dec. 22, 2016  . CENTRAL LINE INSERTION N/A 09/03/2019   Procedure: CENTRAL LINE INSERTION;  Surgeon: Juanito Doom, MD;  Location: [place];  Service: Cardiopulmonary;  Laterality: N/A;  . CHOLECYSTECTOMY    . COLONOSCOPY  2004   Dr. Irving Shows, left sided  diverticula and cecal polyp, path unknown  . COLONOSCOPY  10/29/2011   Procedure: COLONOSCOPY;  Surgeon: Daneil Dolin, MD;  [facility];  Service: Endoscopy;  Laterality: N/A;  10:15  . ESOPHAGOGASTRODUODENOSCOPY  11/2002   Dr. Gala Romney, erosive reflux esophagitis, multiple gastric ulcer and antral/bulbar erosions. Serologies positive for H.Pylori and was treated  . ESOPHAGOGASTRODUODENOSCOPY  11/20014   Dr. Gala Romney, small hh only, ulcers healed  . ESOPHAGOGASTRODUODENOSCOPY  09/21/2011   Dr Trevor Iha [facility], antral erosions, ?early GAVE  . ESOPHAGOGASTRODUODENOSCOPY (EGD) WITH PROPOFOL N/A 09/04/2019   Procedure: ESOPHAGOGASTRODUODENOSCOPY (EGD) WITH PROPOFOL;  Surgeon: Thornton Park, MD;  Location: [place];  Service: Gastroenterology;  Laterality: N/A;  . FISTULOGRAM Left 12/10/2016   Procedure: THROMBECTOMY OF LEFT ARM ARTERIOVENOUS FISTULA;  Surgeon: Waynetta Sandy, MD;  Location: [place];  Service: Vascular;  Laterality: Left;  . INSERTION OF DIALYSIS CATHETER Left 12/10/2016   Procedure: INSERTION OF TUNNELED DIALYSIS CATHETER;  Surgeon: Waynetta Sandy, MD;  Location: [place];  Service: Vascular;  Laterality: Left;  . INSERTION OF DIALYSIS CATHETER Right 06/09/2018   Procedure: INSERTION OF DIALYSIS  CATHETER Right subclavian;  Surgeon: Angelia Mould, MD;  Location: [place];  Service: Vascular;  Laterality: Right;  . IR DIALY SHUNT INTRO [place] W/IMG RIGHT Right 01/01/2018  . IR FLUORO GUIDE CV LINE LEFT  09/10/2019  . IR FLUORO GUIDE CV LINE RIGHT  09/16/2019  . IR GENERIC HISTORICAL  07/16/2016   IR REMOVAL TUN CV CATH W/O FL 07/16/2016 Saverio Danker, PA-C [facility]-INTERV RAD  . IR PTA ADDL CENTRAL DIALYSIS SEG THRU DIALY CIRCUIT RIGHT Right 10/21/2017  . IR REMOVAL TUN CV CATH W/O FL  05/12/2017  . IR THROMBECTOMY AV FISTULA W/THROMBOLYSIS/PTA INC/SHUNT/IMG RIGHT Right 10/21/2017  . IR THROMBECTOMY AV FISTULA W/THROMBOLYSIS/PTA INC/SHUNT/IMG RIGHT  Right 11/27/2017  . IR US GUIDE VASC ACCESS LEFT  09/10/2019  . IR US GUIDE VASC ACCESS RIGHT  10/21/2017  . IR US GUIDE VASC ACCESS RIGHT  11/27/2017  . IR US GUIDE VASC ACCESS RIGHT  01/01/2018  . IR US GUIDE VASC ACCESS RIGHT  09/16/2019  . LIGATION ARTERIOVENOUS GORTEX GRAFT Right 06/09/2018  . LIGATION ARTERIOVENOUS GORTEX GRAFT Right 06/09/2018   Procedure: LIGATION ARTERIOVENOUS GORTEX GRAFT RIGHT ARM;  Surgeon: Angelia Mould, MD;  Location: [place];  Service: Vascular;  Laterality: Right;  . LIGATION ARTERIOVENOUS GORTEX GRAFT Left 12/31/2018   Procedure: LIGATION OF LEFT UPPER ARM ARTERIVENOUS GORTEX GRAFT, LEFT BRACHIAL ARTERY ENDARTERECTOMY WITH BOVINE PATCH ANGIOPLASTY;  Surgeon: Waynetta Sandy, MD;  Location: [place];  Service: Vascular;  Laterality: Left;  . LIGATION OF ARTERIOVENOUS  FISTULA Left 12/19/2015   Procedure: LIGATION OF RADIOCEPHALIC ARTERIOVENOUS  FISTULA;  Surgeon: Conrad [place], MD;  Location: [place];  Service: Vascular;  Laterality: Left;  Marland Kitchen MASS EXCISION Right 02/18/2017   Procedure: EXCISION OF RIGHT AXILLARY EPIDERMAL INCLUSION CYST;  Surgeon: Conrad Ty[place]k, MD;  Location: [place];  Service: Vascular;  Laterality: Right;  . PERIPHERAL VASCULAR CATHETERIZATION N/A 12/14/2015   Procedure: Fistulagram;  Surgeon: Conrad [place], MD;  Location: [place] CV LAB;  Service: Cardiovascular;  Laterality: N/A;  . SHOULDER SURGERY Right    fracture  . TEE WITHOUT CARDIOVERSION N/A 08/16/2019   Procedure: TRANSESOPHAGEAL ECHOCARDIOGRAM (TEE);  Surgeon: Elouise Munroe, MD;  Location: [place];  Service: Cardiovascular;  Laterality: N/A;  . ULTRASOUND GUIDANCE FOR VASCULAR ACCESS  04/15/2017   Procedure: Ultrasound Guidance For Vascular Access;  Surgeon: Angelia Mould, MD;  Location: [place] CV LAB;  Service: Cardiovascular;;  . UPPER EXTREMITY VENOGRAPHY Bilateral 12/17/2016   Procedure: Bilateral Upper Extremity Venography;  Surgeon: Serafina Mitchell,  MD;  Location: [place] CV LAB;  Service: Cardiovascular;  Laterality: Bilateral;  . UPPER EXTREMITY VENOGRAPHY Left 11/03/2018   Procedure: UPPER EXTREMITY VENOGRAPHY;  Surgeon: Serafina Mitchell, MD;  Location: [place] CV LAB;  Service: Cardiovascular;  Laterality: Left;    Social History:  reports that he quit smoking about 15 years ago. He quit after 25.00 years of use. He quit smokeless tobacco use about 32 years ago.  His smokeless tobacco use included chew. He reports that he does not drink alcohol or use drugs.  Allergies  Allergen Reactions  . Aspirin Other (See Comments)    Causes internal bleeding  History of ulcers    Family History  Problem Relation Age of Onset  . Hypertension Mother   . Colon cancer Neg Hx   . Liver disease Neg Hx     Prior to Admission medications   Medication Sig Start Date End Date Taking?  Authorizing Provider  Amino Acids-Protein Hydrolys (FEEDING SUPPLEMENT, PRO-STAT SUGAR FREE 64,) LIQD Take 30 mLs by mouth in the morning and at bedtime.    Yes [provider]  ascorbic acid (VITAMIN C) 500 MG tablet Take 500 mg by mouth daily.   Yes [provider]  atorvastatin (LIPITOR) 40 MG tablet Take 40 mg by mouth daily.  11/03/15  Yes [provider]  cinacalcet (SENSIPAR) 60 MG tablet Take 60 mg by mouth at bedtime.    Yes [provider]  melatonin 3 MG TABS tablet Take 3 mg by mouth at bedtime.    Yes [provider]  midodrine (PROAMATINE) 10 MG tablet Take 1 tablet (10 mg total) by mouth 3 (three) times daily. 09/20/19 10/20/19 Yes Elgergawy, Silver Huguenin, MD  mirtazapine (REMERON) 7.5 MG tablet Take 7.5 mg by mouth at bedtime.    Yes [provider]  Multiple Vitamin (MULTIVITAMIN) tablet Take 1 tablet by mouth daily.   Yes [provider]  Nutritional Supplements (FEEDING SUPPLEMENT, NEPRO CARB STEADY,) LIQD Take 237 mLs by mouth 2 (two) times daily between meals. Patient taking  differently: Take 237 mLs by mouth in the morning and at bedtime.  07/22/19  Yes Persons, Bevely [place], [place]  oxyCODONE (OXY IR/ROXICODONE) 5 MG immediate release tablet Take 1 tablet (5 mg total) by mouth every 6 (six) hours as needed for severe pain (5 mg mild pain/ 10 mg moderate pain). 09/23/19  Yes Ghimire, Henreitta Leber, MD  pantoprazole (PROTONIX) 40 MG tablet Take 1 tablet (40 mg total) by mouth 2 (two) times daily. 09/20/19  Yes Elgergawy, Silver Huguenin, MD  Zinc 50 MG CAPS Take 1 capsule by mouth daily.   Yes [provider]  acetaminophen (TYLENOL) 325 MG tablet Take 2 tablets (650 mg total) by mouth every 6 (six) hours as needed for mild pain (or Fever >/= 101). 09/18/19   Elgergawy, Silver Huguenin, MD  B Complex-C (B-COMPLEX WITH VITAMIN C) tablet Take 1 tablet by mouth daily.     [provider]  cholecalciferol (VITAMIN D3) 25 MCG (1000 UT) tablet Take 1,000 Units by mouth daily.     [provider]  clopidogrel (PLAVIX) 75 MG tablet Take 1 tablet (75 mg total) by mouth daily. Start on 5/25 09/22/19   Elgergawy, Silver Huguenin, MD  sennosides-docusate sodium (SENOKOT-S) 8.6-50 MG tablet Take 1 tablet by mouth daily.     [provider]    Physical Exam: Vitals:   09/20/2019 1615 08/29/2019 1719 09/03/2019 1723 09/06/2019 1845  BP: (!) 122/97  116/81 (!) 141/87  Pulse:      Resp: (!) 25  16 (!) 27  Temp:  (!) 97.3 F (36.3 C)    TempSrc:  Axillary    SpO2:      Weight:  51.9 kg    Height:  5\' 5"  (1.651 m)      Constitutional: unresponsive Eyes: PERRL, lids and conjunctivae normal ENMT: Mucous membranes are moist. Posterior pharynx clear of any exudate or lesions.Normal dentition.  Neck: normal, supple, no masses, no thyromegaly Respiratory: Diminished breath sounds bilaterally.  Increased respiratory effort. No accessory muscle use.  Cardiovascular: Regular rate and rhythm, no murmurs / rubs / gallops. No extremity edema. 2+ pedal pulses. No carotid bruits.  Abdomen: no  tenderness, no masses palpated. No hepatosplenomegaly. Bowel sounds positive.  Musculoskeletal: Left above-the-knee amputation, incision has staples in place, right transmetatarsal amputation, swelling/lymphedema (chronic) of left arm. Skin: Unstageable sacral decubitus ulcer. Neurologic:  grimaces to pain Psychiatric: unresponsive   Labs on Admission: I have personally reviewed following labs and imaging studies  CBC: Recent Labs  Lab 09/21/19 0243 09/22/19 0500 09/23/19 0508 09/26/2019 1129 09/08/2019 1144  WBC 27.6* 29.3* 35.1* 40.3*  --   HGB 8.8* 7.9* 9.9* 8.6* 11.2*  HCT 28.0* 25.1* 32.1* 27.9* 33.0*  MCV 65.9* 67.5* 68.9* 66.9*  --   PLT 290 329 PLATELET CLUMPS NOTED ON SMEAR, COUNT APPEARS ADEQUATE 353  --    Basic Metabolic Panel: Recent Labs  Lab 09/21/19 0243 09/22/19 0500 09/23/19 0639 09/17/2019 1129 09/04/2019 1144  NA 138 137 135 138 137  K 3.4* 3.4* 5.1 3.2* 3.2*  CL 99 100 100 99 101  CO2 27 27 24 25   --   GLUCOSE 100* 106* 93 97 85  BUN 15 19 15  26* 23  CREATININE 2.64* 2.83* 2.09* 2.76* 2.80*  CALCIUM 7.8* 7.5* 7.9* 8.5*  --   MG  --  1.8 2.2  --   --   PHOS  --  1.9* 1.9*  --   --    GFR: Estimated Creatinine Clearance: 16.5 mL/min (A) (by C-G formula based on SCr of 2.8 mg/dL (H)). Liver Function Tests: Recent Labs  Lab 09/22/19 0500 09/23/19 0639 09/04/2019 1129  AST  --   --  26  ALT  --   --  6  ALKPHOS  --   --  70  BILITOT  --   --  1.6*  PROT  --   --  6.7  ALBUMIN 2.2* 2.2* 2.1*   No results for input(s): LIPASE, AMYLASE in the last 168 hours. No results for input(s): AMMONIA in the last 168 hours. Coagulation Profile: Recent Labs  Lab 09/04/2019 1129  INR 1.4*   Cardiac Enzymes: No results for input(s): CKTOTAL, CKMB, CKMBINDEX, TROPONINI in the last 168 hours. BNP (last 3 results) No results for input(s): PROBNP in the last 8760 hours. HbA1C: No results for input(s): HGBA1C in the last 72 hours. CBG: No results for input(s):  GLUCAP in the last 168 hours. Lipid Profile: No results for input(s): CHOL, HDL, LDLCALC, TRIG, CHOLHDL, LDLDIRECT in the last 72 hours. Thyroid Function Tests: No results for input(s): TSH, T4TOTAL, FREET4, T3FREE, THYROIDAB in the last 72 hours. Anemia Panel: No results for input(s): VITAMINB12, FOLATE, FERRITIN, TIBC, IRON, RETICCTPCT in the last 72 hours. Urine analysis: No results found for: COLORURINE, APPEARANCEUR, LABSPEC, PHURINE, GLUCOSEU, HGBUR, BILIRUBINUR, KETONESUR, PROTEINUR, UROBILINOGEN, NITRITE, LEUKOCYTESUR  Radiological Exams on Admission: CT Angio Chest PE W and/or Wo Contrast  Result Date: 09/11/2019 CLINICAL DATA:  Acute onset shortness of breath today. Recently underwent dialysis. Clinical suspicion for pulmonary embolism. EXAM: CT ANGIOGRAPHY CHEST WITH CONTRAST TECHNIQUE: Multidetector CT imaging of the chest was performed using the standard protocol during bolus administration of intravenous contrast. Multiplanar CT image reconstructions and MIPs were obtained to evaluate the vascular anatomy. CONTRAST:  133mL OMNIPAQUE IOHEXOL 350 MG/ML SOLN COMPARISON:  08/02/2018 FINDINGS: Cardiovascular: Satisfactory opacification of pulmonary arteries noted. Left lower lobe atelectasis limits evaluation the peripheral left lower lobe pulmonary arteries, however no pulmonary emboli identified. No evidence of thoracic aortic dissection or aneurysm. Aortic and coronary artery atherosclerosis noted. Mediastinum/Nodes: No masses or pathologically enlarged lymph nodes identified. Lungs/Pleura: Moderate left and small right pleural effusions are seen. Left lower lobe compressive atelectasis is demonstrated. Mild patchy airspace disease is seen in the superior left lower lobe and posterior left lower lobe, suspicious for pneumonia. An 8 mm pulmonary  nodule in the medial left upper lobe on image 47/6 remains stable, consistent with benign etiology. Upper abdomen: No acute findings. Numerous small  cysts are again seen in the visualized portions of the kidneys, consistent with cystic disease of dialysis. Musculoskeletal: No suspicious bone lesions identified. Lucency involving the T10 vertebra remains stable. Review of the MIP images confirms the above findings. IMPRESSION: 1. No evidence of pulmonary embolism. 2. Moderate left and small right pleural effusions. 3. Left lower lobe compressive atelectasis. 4. Mild patchy airspace disease in superior left lower lobe and posterior left lower lobe, suspicious for pneumonia. Aortic Atherosclerosis (ICD10-I70.0). Electronically Signed   By: Marlaine Hind M.D.   On: 09/03/2019 15:09   DG Chest Port 1 View  Result Date: 09/26/2019 CLINICAL DATA:  Shortness of breath EXAM: PORTABLE CHEST 1 VIEW COMPARISON:  Sep 10, 2019 FINDINGS: There is airspace opacity in the left base. There is an area of focal air in either in or overlying this region. Lungs elsewhere clear. Heart size and pulmonary vascularity are normal. No adenopathy. Central catheter tip is in the superior vena cava. No pneumothorax evident. There is aortic atherosclerosis. There is arthropathy in the shoulders with superior migration of the humeral heads. There is a rod in the proximal right humerus. IMPRESSION: Airspace opacity concerning for potential degree of pneumonia left base. Areas either in or overlying the left base region. Question elevation of the left hemidiaphragm with air in this area. This area is less than optimally visualized on this study. It may be prudent to obtain a repeat study with repositioning to better assess this region. Alternatively, CT may be helpful for better anatomic delineation. Heart size within normal limits. Central catheter tip in superior vena cava. Aortic Atherosclerosis (ICD10-I70.0). Electronically Signed   By: Lowella Grip III M.D.   On: 09/11/2019 11:07   DG HIP UNILAT WITH PELVIS 2-3 VIEWS LEFT  Result Date: 09/05/2019 CLINICAL DATA:  Pain EXAM: DG HIP  (WITH OR WITHOUT PELVIS) 2-3V LEFT COMPARISON:  None. FINDINGS: Frontal pelvis as well as frontal and lateral left hip images were obtained. Patient has had previous amputation at the proximal femoral diaphysis level on the left. There is air near the stump in this area with skin staples, likely due to recent postoperative change. No bony destruction. No acute fracture or dislocation is appreciable. There is diffuse osteoporosis. There is moderate symmetric narrowing of each hip joint. There is extensive aortic atherosclerosis as well as multiple iliac and femoral arterial foci of atherosclerotic calcification. IMPRESSION: 1. Diffuse osteoporosis. 2. No acute fracture or dislocation. 3. Status post amputation of the proximal left femur with postoperative changes in this area. No bony destruction evident. 4. Symmetric narrowing each hip joint. 5. 5. Widespread aortic, iliac, and femoral artery atherosclerotic calcifications bilaterally. Electronically Signed   By: Lowella Grip III M.D.   On: 09/02/2019 11:09    EKG: Independently reviewed. Atrial fibrillation  Assessment/Plan Active Problems:   ESRD on dialysis ([place])   PVD (peripheral vascular disease) (HCC)   S/P transmetatarsal amputation of foot, right (HCC)   Leukocytosis   Acute metabolic encephalopathy   Chronic hypotension   Severe sepsis with septic shock (H[facility])   DNR (do not resuscitate)   Acute respiratory failure with hypoxia ([place])   HCAP (healthcare-associated pneumonia)   Chronic diastolic CHF (congestive heart failure) (HCC)   Bilateral pleural effusion   S/P AKA (above knee amputation), left ([place])   Anemia due to chronic kidney disease  1. Severe sepsis with septic shock.  Source is likely pneumonia.  He has been started on intravenous antibiotics.  Blood cultures have been sent.  Lactic acid elevated on admission but has trended down with IV fluids.  Blood pressures remain low and will be started on low-dose Levophed.   We will check cortisol and start on stress dose steroids. 2. Acute respiratory failure with hypoxia.  Related to pneumonia and pleural effusions.  He is on 100% oxygen.  Will attempt to wean down oxygen as he tolerates. 3. Leukocytosis.  Marked leukocytosis, present since prior admission.  He is not having any diarrhea.  Continue to monitor. 4. Healthcare associated pneumonia.  Currently on vancomycin and cefepime. 5. End-stage renal disease on hemodialysis.  With his current hemodynamic instability, he would not be candidate for hemodialysis at this time.  Other options would be to consider CRRT, but the services are provided at [facility].  Unfortunately, he appears to be too unstable to transfer.  Regardless, he does not have any hyperkalemia, significant uremia.  Discussed with Dr. Marval Regal who will see in consult. 6. Peripheral vascular disease.  S/p left above-the-knee amputation.  Resume on antiplatelet agents once mental status has improved 7. Acute metabolic encephalopathy.  Likely related to sepsis/hypotension.  Continue to monitor. 8. Chronic hypotension.  He is chronically on midodrine.  Resume once mental status has improved. 9. Large sacral decubitus ulcer, unstageable, present on admission.  Wound care consulted. 10. Goals of care.  Discussed patient's multiple medical problems with the patient's daughter, his current state and his overall poor prognosis.  If he fails to show meaningful improvement, will likely need palliative care consult tomorrow.  DVT prophylaxis: heparin  Code Status: DNR, confirmed with patient's daughter Family Communication: discussed with daughter at the bedside  Disposition Plan: return to SNF on discharge  Consults called: nephrology  Admission status: inpatient, icu  Severity of Illness: The appropriate patient status for this patient is INPATIENT. Inpatient status is judged to be reasonable and necessary in order to provide the required intensity of  service to ensure the patient's safety. The patient's presenting symptoms, physical exam findings, and initial radiographic and laboratory data in the context of their chronic comorbidities is felt to place them at high risk for further clinical deterioration. Furthermore, it is not anticipated that the patient will be medically stable for discharge from the hospital within 2 midnights of admission. The following factors support the patient status of inpatient.    "           The patient's presenting symptoms include altered mental status, shortness of breath "           The worrisome physical exam findings include hypotension, hypoxia "           The initial radiographic and laboratory data are worrisome because of elevated lactic acid, bilateral pleural effusion, pneumonia on CT "           The chronic co-morbidities include ESRD, chronic hypotension, PVD, multiple amputations, chronic diastolic chf     * I certify that at the point of admission it is my clinical judgment that the patient will require inpatient hospital care spanning beyond 2 midnights from the point of admission due to high intensity of service, high risk for further deterioration and high frequency of surveillance required.*    Critical care procedure note Authorized and performed by: Kathie Dike Total critical care time: Approximately 45 minutes Due to high probability of clinically  significant, life-threatening deterioration, the patient required my highest level of preparedness to intervene emergently and I personally spent this critical care time directly and personally managing the patient.  The critical care time included obtaining a history, examining the patient, pulse oximetry, ordering and review of studies, arranging urgent treatment with development of a management plan, evaluation of patient's response to treatment, frequent reassessment, discussions with other providers.  Critical care time was performed to assess  and manage the high probability of imminent, life-threatening deterioration that could result in multiorgan failure.  It was exclusive of separate billable procedures and treating other patients and teaching time.  Please see MDM section and the rest of the of note for further information on patient assessment and treatment  Kathie Dike MD [facility]   If 7PM-7AM, please contact night-coverage [email or web address]   09/04/2019, 7:05 PM

## 2019-09-28 DIAGNOSIS — Z515 Encounter for palliative care: Secondary | ICD-10-CM

## 2019-09-28 DIAGNOSIS — Z7189 Other specified counseling: Secondary | ICD-10-CM

## 2019-09-28 DIAGNOSIS — L89154 Pressure ulcer of sacral region, stage 4: Secondary | ICD-10-CM | POA: Diagnosis present

## 2019-09-28 DIAGNOSIS — A4151 Sepsis due to Escherichia coli [E. coli]: Secondary | ICD-10-CM | POA: Diagnosis present

## 2019-09-28 DIAGNOSIS — R6520 Severe sepsis without septic shock: Secondary | ICD-10-CM

## 2019-09-28 LAB — GLUCOSE, CAPILLARY
Glucose-Capillary: 76 mg/dL (ref 70–99)
Glucose-Capillary: 95 mg/dL (ref 70–99)
Glucose-Capillary: 107 mg/dL — ABNORMAL HIGH (ref 70–99)

## 2019-09-28 LAB — COMPREHENSIVE METABOLIC PANEL
ALT: 7 U/L (ref 0–44)
AST: 18 U/L (ref 15–41)
Albumin: 1.7 g/dL — ABNORMAL LOW (ref 3.5–5.0)
Alkaline Phosphatase: 58 U/L (ref 38–126)
Anion gap: 12 (ref 5–15)
BUN: 34 mg/dL — ABNORMAL HIGH (ref 8–23)
CO2: 26 mmol/L (ref 22–32)
Calcium: 8.6 mg/dL — ABNORMAL LOW (ref 8.9–10.3)
Chloride: 100 mmol/L (ref 98–111)
Creatinine, Ser: 3.26 mg/dL — ABNORMAL HIGH (ref 0.61–1.24)
GFR calc Af Amer: 20 mL/min — ABNORMAL LOW (ref >60)
GFR calc non Af Amer: 17 mL/min — ABNORMAL LOW (ref >60)
Glucose, Bld: 104 mg/dL — ABNORMAL HIGH (ref 70–99)
Potassium: 3.5 mmol/L (ref 3.5–5.1)
Sodium: 138 mmol/L (ref 135–145)
Total Bilirubin: 1.1 mg/dL (ref 0.3–1.2)
Total Protein: 5.8 g/dL — ABNORMAL LOW (ref 6.5–8.1)

## 2019-09-28 LAB — HEMOGLOBIN AND HEMATOCRIT, BLOOD
HCT: 33.8 % — ABNORMAL LOW (ref 39.0–52.0)
Hemoglobin: 10.8 g/dL — ABNORMAL LOW (ref 13.0–17.0)

## 2019-09-28 LAB — PREPARE RBC (CROSSMATCH)

## 2019-09-28 LAB — BLOOD CULTURE ID PANEL (REFLEXED)

## 2019-09-28 LAB — CBC
HCT: 24.9 % — ABNORMAL LOW (ref 39.0–52.0)
Hemoglobin: 7.9 g/dL — ABNORMAL LOW (ref 13.0–17.0)
MCH: 20.8 pg — ABNORMAL LOW (ref 26.0–34.0)
MCHC: 31.7 g/dL (ref 30.0–36.0)
MCV: 65.5 fL — ABNORMAL LOW (ref 80.0–100.0)
Platelets: 293 10*3/uL (ref 150–400)
RBC: 3.8 MIL/uL — ABNORMAL LOW (ref 4.22–5.81)
RDW: 24 % — ABNORMAL HIGH (ref 11.5–15.5)
WBC: 42 10*3/uL — ABNORMAL HIGH (ref 4.0–10.5)
nRBC: 0.1 % (ref 0.0–0.2)

## 2019-09-28 MED ORDER — CHLORHEXIDINE GLUCONATE CLOTH 2 % EX PADS
6.0000 | MEDICATED_PAD | Freq: Every day | CUTANEOUS | Status: DC
  Administered 2019-09-28 – 2019-10-04 (×7): 6 via TOPICAL

## 2019-09-28 MED ORDER — DEXTROSE 50 % IV SOLN
25.0000 mL | Freq: Once | INTRAVENOUS | Status: AC
  Administered 2019-09-28: 25 mL via INTRAVENOUS

## 2019-09-28 MED ORDER — HEPARIN SODIUM (PORCINE) 1000 UNIT/ML DIALYSIS: 1000.0000 [IU] | INTRAMUSCULAR | Status: DC | PRN

## 2019-09-28 MED ORDER — DEXTROSE 50 % IV SOLN
INTRAVENOUS | Status: AC
  Administered 2019-09-28: 25 mL via INTRAVENOUS
  Filled 2019-09-28: qty 50

## 2019-09-28 MED ORDER — FENTANYL CITRATE (PF) 100 MCG/2ML IJ SOLN
50.0000 ug | INTRAMUSCULAR | Status: DC | PRN
  Administered 2019-09-29 – 2019-10-05 (×3): 50 ug via INTRAVENOUS
  Filled 2019-09-28 (×3): qty 2

## 2019-09-28 MED ORDER — HEPARIN SODIUM (PORCINE) 1000 UNIT/ML DIALYSIS: 20.0000 [IU]/kg | INTRAMUSCULAR | Status: DC | PRN

## 2019-09-28 MED ORDER — SODIUM CHLORIDE 0.9 % IV SOLN
500.0000 mg | Freq: Once | INTRAVENOUS | Status: AC
  Administered 2019-09-28: 500 mg via INTRAVENOUS
  Filled 2019-09-28: qty 0.5

## 2019-09-28 MED ORDER — DAKINS (1/4 STRENGTH) 0.125 % EX SOLN
Freq: Two times a day (BID) | CUTANEOUS | Status: AC
  Filled 2019-09-28: qty 473

## 2019-09-28 MED ORDER — SODIUM CHLORIDE 0.9 % IV SOLN: 100.0000 mL | INTRAVENOUS | Status: DC | PRN

## 2019-09-28 MED ORDER — LORAZEPAM 2 MG/ML IJ SOLN: 1.0000 mg | INTRAMUSCULAR | Status: DC | PRN

## 2019-09-28 MED ORDER — SODIUM CHLORIDE 0.9% IV SOLUTION: Freq: Once | INTRAVENOUS | Status: AC

## 2019-09-28 MED ORDER — INSULIN ASPART 100 UNIT/ML ~~LOC~~ SOLN: 0.0000 [IU] | SUBCUTANEOUS | Status: DC

## 2019-09-28 MED ORDER — DAKINS (1/4 STRENGTH) 0.125 % EX SOLN
Freq: Two times a day (BID) | CUTANEOUS | Status: DC
  Administered 2019-09-29: 1 via TOPICAL
  Filled 2019-09-28: qty 473

## 2019-09-28 MED ORDER — ALBUMIN HUMAN 25 % IV SOLN
25.0000 g | Freq: Once | INTRAVENOUS | Status: AC
  Administered 2019-09-28: 25 g via INTRAVENOUS

## 2019-09-28 MED ORDER — GLYCOPYRROLATE 0.2 MG/ML IJ SOLN: 0.4000 mg | INTRAMUSCULAR | Status: DC | PRN

## 2019-09-28 MED ORDER — CHLORHEXIDINE GLUCONATE CLOTH 2 % EX PADS
6.0000 | MEDICATED_PAD | Freq: Every day | CUTANEOUS | Status: DC
  Administered 2019-09-28 – 2019-10-04 (×4): 6 via TOPICAL

## 2019-09-28 MED ORDER — ALTEPLASE 2 MG IJ SOLR: 2.0000 mg | Freq: Once | INTRAMUSCULAR | Status: DC | PRN

## 2019-09-28 MED ORDER — ALBUMIN HUMAN 25 % IV SOLN
25.0000 g | Freq: Once | INTRAVENOUS | Status: AC
  Administered 2019-09-28: 25 g via INTRAVENOUS
  Filled 2019-09-28: qty 100

## 2019-09-28 MED ORDER — MUPIROCIN 2 % EX OINT
TOPICAL_OINTMENT | Freq: Two times a day (BID) | CUTANEOUS | Status: DC
  Administered 2019-09-28 – 2019-09-30 (×2): 1 via NASAL
  Filled 2019-09-28: qty 22

## 2019-09-28 MED ORDER — SODIUM CHLORIDE 0.9 % IV SOLN
500.0000 mg | INTRAVENOUS | Status: DC
  Administered 2019-09-28 – 2019-09-30 (×3): 500 mg via INTRAVENOUS
  Filled 2019-09-28 (×6): qty 0.5

## 2019-09-28 MED ORDER — HEPARIN SODIUM (PORCINE) 1000 UNIT/ML DIALYSIS: 3800.0000 [IU] | INTRAMUSCULAR | Status: DC | PRN

## 2019-09-28 NOTE — Progress Notes (Signed)
PROGRESS NOTE    [name]  WUX:[id number] DOB: November 25, 1940 DOA: 09/16/2019 PCP: [name], MD    Brief Narrative:  [name]. is a 79 y.o. 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  He was discharged to skilled nursing facility.  Patient had presented to the dialysis center today for routine dialysis when he reported that he was short of breath.  History is somewhat limited since patient cannot participate.  History is obtained from his daughter.  At the dialysis center, supplemental oxygen was applied, but he continued to be short of breath.  He was sent to the ER for evaluation.  Upon arrival to the emergency room, he was noted to be hypotensive, hypoxic.  Accurate vital signs were proved to be difficult since pulse ox did not have accurate waveform and blood pressure cuff had to be placed on his leg.  Initial blood gas showed pH of 7.46, PCO2 of 33, PO2 of 248 on 100% oxygen.  CBC showed WBC count of 40.3, up from 35.1, from 4 days earlier.  Potassium 3.2, BUN of 26, lactic acid of 3.4.  CT angiogram of the chest was negative for pulmonary embolism, but did show bilateral pleural effusions and left-sided pneumonia.  Patient was bolused with IV fluids per sepsis protocol.  He received intravenous antibiotics.  He was referred for admission.   Assessment & Plan:   Active Problems:   ESRD on dialysis [facility])   PVD (peripheral vascular disease) (HCC)   S/P transmetatarsal amputation of foot, right (HCC)   Leukocytosis   Acute metabolic encephalopathy   Chronic hypotension   Severe sepsis with septic shock (HCC)   DNR (do not resuscitate)   Acute respiratory  failure with hypoxia ([place])   HCAP (healthcare-associated pneumonia)   Chronic diastolic CHF (congestive heart failure) (HCC)   Bilateral pleural effusion   S/P AKA (above knee amputation), left ([name])   Anemia due to chronic kidney disease   1. Severe sepsis with septic shock.  Source is likely pneumonia/sacral decub.  He has been started on intravenous antibiotics.  Blood cultures positive for E coli.  Lactic acid elevated on admission but has trended down with IV fluids. Cortisol is appropriately elevated, so will discontinue stress dose steroids.  2. Acute respiratory failure with hypoxia.  Related to pneumonia and pleural effusions. Initially on 100% NRB, now on 4L [place].  Will attempt to wean down oxygen as he tolerates. 3. Leukocytosis.  Marked leukocytosis, present since prior admission.  Worsening of WBC count related to sepsis. He is not having any diarrhea.  Continue to monitor. 4. Healthcare associated pneumonia.  Currently on vancomycin and meropenem 5. End-stage renal disease on hemodialysis.  Nephrology following. HD will be challenging in light of on going issues with hypotension. 6. Peripheral vascular disease.  S/p left above-the-knee amputation.  Resume on antiplatelet agents once mental status has improved and he is able to take medications by mouth 7. Acute metabolic encephalopathy.  Likely related to sepsis/hypotension. He remains somnolent/unresponsive.  Continue to monitor. 8. Chronic hypotension.  He is chronically on midodrine.  Resume once mental status has improved. 9. Large sacral decubitus ulcer, unstageable, present on admission.  Wound care input appreciated. If patient stabilizes, can consider MRI of sacrum to  evaluate for osteomyelitis, although I suspect this will be present considering the degree of the wound. 10. Goals of care.  Palliative care consulted to help assist in goals of care discussions. Overall prognosis is poor. Hospice/comfort care would be  appropriate.   DVT prophylaxis: heparin Code Status: DNR Family Communication: updated daughter [name] over the phone 6/1 Disposition Plan: Status is: Inpatient  Remains inpatient appropriate because:Inpatient level of care appropriate due to severity of illness   Dispo: The patient is from: SNF              Anticipated d/c is to: SNF              Anticipated d/c date is: > 3 days              Patient currently is not medically stable to d/c.    Consultants:   Palliative care  Nephrology  Wound care  Procedures:     Antimicrobials:   Vancomycin  Meropenem   Subjective: Patient is somnolent, does not follow commands or answer questions  Objective: Vitals:   09/28/19 0945 09/28/19 1000 09/28/19 1045 09/28/19 1100  BP:   90/75 104/88  Pulse:  (!) 40 (!) 105 89  Resp:  14 17 18   Temp:      TempSrc:      SpO2: 100% 100% 100% 100%  Weight:      Height:        Intake/Output Summary (Last 24 hours) at 09/28/2019 1208 Last data filed at 09/28/2019 0751 Gross per 24 hour  Intake 2199.86 ml  Output --  Net 2199.86 ml   Filed Weights   08/31/2019 1056 09/11/2019 1719 09/28/19 0628  Weight: 52.2 kg 51.9 kg 52.6 kg    Examination:  General exam: somnolent, does not respond to voice Respiratory system: diminished breath sounds bilaterally. Respiratory effort normal. Cardiovascular system: S1 & S2 heard, RRR. No JVD, murmurs, rubs, gallops or clicks.  Gastrointestinal system: Abdomen is nondistended, soft and nontender. No organomegaly or masses felt. Normal bowel sounds heard. Central nervous system: limited exam due to mental status Extremities: LLE AKA, RLE TMA Skin: stage 4 sacral decubitus ulcer. Incision site of AKA with staples looks clean Psychiatry: somnolent    Data Reviewed: I have personally reviewed following labs and imaging studies  CBC: Recent Labs  Lab 09/22/19 0500 09/23/19 0508 09/17/2019 1129 09/26/2019 1144 09/28/19 0450  WBC 29.3* 35.1*  40.3*  --  42.0*  HGB 7.9* 9.9* 8.6* 11.2* 7.9*  HCT 25.1* 32.1* 27.9* 33.0* 24.9*  MCV 67.5* 68.9* 66.9*  --  65.5*  PLT 329 PLATELET CLUMPS NOTED ON SMEAR, COUNT APPEARS ADEQUATE 353  --  102   Basic Metabolic Panel: Recent Labs  Lab 09/22/19 0500 09/23/19 0639 09/15/2019 1129 09/04/2019 1144 09/28/19 0450  NA 137 135 138 137 138  K 3.4* 5.1 3.2* 3.2* 3.5  CL 100 100 99 101 100  CO2 27 24 25   --  26  GLUCOSE 106* 93 97 85 104*  BUN 19 15 26* 23 34*  CREATININE 2.83* 2.09* 2.76* 2.80* 3.26*  CALCIUM 7.5* 7.9* 8.5*  --  8.6*  MG 1.8 2.2  --   --   --   PHOS 1.9* 1.9*  --   --   --    GFR: Estimated Creatinine Clearance: 14.3 mL/min (A) (by C-G formula based on SCr of 3.26 mg/dL (H)). Liver Function Tests: Recent Labs  Lab 09/22/19 0500 09/23/19 5852 09/15/2019 1129 09/28/19 0450  AST  --   --  26 18  ALT  --   --  6 7  ALKPHOS  --   --  70 58  BILITOT  --   --  1.6* 1.1  PROT  --   --  6.7 5.8*  ALBUMIN 2.2* 2.2* 2.1* 1.7*   No results for input(s): LIPASE, AMYLASE in the last 168 hours. No results for input(s): AMMONIA in the last 168 hours. Coagulation Profile: Recent Labs  Lab 09/06/2019 1129  INR 1.4*   Cardiac Enzymes: No results for input(s): CKTOTAL, CKMB, CKMBINDEX, TROPONINI in the last 168 hours. BNP (last 3 results) No results for input(s): PROBNP in the last 8760 hours. HbA1C: No results for input(s): HGBA1C in the last 72 hours. CBG: Recent Labs  Lab 09/28/19 0631 09/28/19 0741 09/28/19 1143  GLUCAP 76 95 107*   Lipid Profile: No results for input(s): CHOL, HDL, LDLCALC, TRIG, CHOLHDL, LDLDIRECT in the last 72 hours. Thyroid Function Tests: No results for input(s): TSH, T4TOTAL, FREET4, T3FREE, THYROIDAB in the last 72 hours. Anemia Panel: No results for input(s): VITAMINB12, FOLATE, FERRITIN, TIBC, IRON, RETICCTPCT in the last 72 hours. Sepsis Labs: Recent Labs  Lab 08/30/2019 1129 09/14/2019 1729 09/20/2019 2041  PROCALCITON  --   --  7.72   LATICACIDVEN 3.4* 1.5 0.9    Recent Results (from the past 240 hour(s))  Blood culture (routine x 2)     Status: None (Preliminary result)   Collection Time: 08/30/2019 11:29 AM   Specimen: Right Antecubital; Blood  Result Value Ref Range Status   Specimen Description   Final    RIGHT ANTECUBITAL Performed at [facility], [address] [place], [place] [address]    Special Requests   Final    BOTTLES DRAWN AEROBIC ONLY Blood Culture adequate volume Performed at [facility], [address] [place], [place] [address]    Culture  Setup Time   Final    AEROBIC BOTTLE ONLY GRAM NEGATIVE RODS Gram Stain Report Called to,Read Back By and Verified With: H TETREAULT,RN @0335  09/28/19 MKELLY Organism ID to follow    Culture   Final    CULTURE REINCUBATED FOR BETTER GROWTH Performed at [facility], [place] [address] [place], [place] [address]    Report Status PENDING  Incomplete  Blood culture (routine x 2)     Status: None (Preliminary result)   Collection Time: 09/01/2019 11:29 AM   Specimen: Right Antecubital; Blood  Result Value Ref Range Status   Specimen Description RIGHT ANTECUBITAL  Final   Special Requests   Final    BOTTLES DRAWN AEROBIC ONLY Blood Culture adequate volume   Culture   Final    NO GROWTH < 24 HOURS Performed at [facility], [address] [place],  [address]    Report Status PENDING  Incomplete  Blood Culture ID Panel (Reflexed)     Status: Abnormal   Collection Time: 09/14/2019 11:29 AM  Result Value Ref Range Status   Enterococcus species NOT DETECTED NOT DETECTED Final   Listeria monocytogenes NOT DETECTED NOT DETECTED Final   Staphylococcus species NOT DETECTED NOT DETECTED Final   Staphylococcus aureus (BCID) NOT DETECTED NOT DETECTED Final   Streptococcus species NOT DETECTED NOT DETECTED Final   Streptococcus agalactiae NOT DETECTED NOT DETECTED Final   Streptococcus pneumoniae NOT DETECTED NOT DETECTED Final   Streptococcus pyogenes  NOT DETECTED NOT DETECTED Final   Acinetobacter baumannii NOT DETECTED NOT DETECTED Final   Enterobacteriaceae  species DETECTED (A) NOT DETECTED Final    Comment: Enterobacteriaceae represent a large family of gram-negative bacteria, not a single organism. CRITICAL RESULT CALLED TO, READ BACK BY AND VERIFIED WITH: Gloris Manchester PharmD 8:45 09/28/19 (wilsonm)    Enterobacter cloacae complex NOT DETECTED NOT DETECTED Final   Escherichia coli DETECTED (A) NOT DETECTED Final    Comment: CRITICAL RESULT CALLED TO, READ BACK BY AND VERIFIED WITH: Gloris Manchester PharmD 8:45 09/28/19 ([email or web address])    Klebsiella oxytoca NOT DETECTED NOT DETECTED Final   Klebsiella pneumoniae NOT DETECTED NOT DETECTED Final   Proteus species NOT DETECTED NOT DETECTED Final   Serratia marcescens NOT DETECTED NOT DETECTED Final   Carbapenem resistance NOT DETECTED NOT DETECTED Final   Haemophilus influenzae NOT DETECTED NOT DETECTED Final   Neisseria meningitidis NOT DETECTED NOT DETECTED Final   Pseudomonas aeruginosa NOT DETECTED NOT DETECTED Final   Candida albicans NOT DETECTED NOT DETECTED Final   Candida glabrata NOT DETECTED NOT DETECTED Final   Candida krusei NOT DETECTED NOT DETECTED Final   Candida parapsilosis NOT DETECTED NOT DETECTED Final   Candida tropicalis NOT DETECTED NOT DETECTED Final    Comment: Performed at [facility], [place] [address] [place], [place] [address]  SARS Coronavirus 2 by RT PCR (hospital order, performed in [facility] hospital lab) Nasopharyngeal Nasopharyngeal Swab     Status: None   Collection Time: 09/15/2019 12:29 PM   Specimen: Nasopharyngeal Swab  Result Value Ref Range Status   SARS Coronavirus 2 NEGATIVE NEGATIVE Final    Comment: (NOTE) SARS-CoV-2 target nucleic acids are NOT DETECTED. The SARS-CoV-2 RNA is generally detectable in upper and lower respiratory specimens during the acute phase of infection. The lowest concentration of SARS-CoV-2 viral copies this assay can detect  is 250 copies / mL. A negative result does not preclude SARS-CoV-2 infection and should not be used as the sole basis for treatment or other patient management decisions.  A negative result may occur with improper specimen collection / handling, submission of specimen other than nasopharyngeal swab, presence of viral mutation(s) within the areas targeted by this assay, and inadequate number of viral copies (<250 copies / mL). A negative result must be combined with clinical observations, patient history, and epidemiological information. Fact Sheet for Patients:   StrictlyIdeas.no Fact Sheet for Healthcare Providers: BankingDealers.co.za This test is not yet approved or cleared  by the [place] FDA and has been authorized for detection and/or diagnosis of SARS-CoV-2 by FDA under an Emergency Use Authorization (EUA).  This EUA will remain in effect (meaning this test can be used) for the duration of the COVID-19 declaration under Section 564(b)(1) of the Act, 21 U.S.C. section 360bbb-3(b)(1), unless the authorization is terminated or revoked sooner. Performed at [facility], [address] [place], [place] [address]   MRSA PCR Screening     Status: Abnormal   Collection Time: 09/19/2019  6:32 PM   Specimen: Nasal Mucosa; Nasopharyngeal  Result Value Ref Range Status   MRSA by PCR POSITIVE (A) NEGATIVE Final    Comment:        The GeneXpert MRSA Assay (FDA approved for NASAL specimens only), is one component of a comprehensive MRSA colonization surveillance program. It is not intended to diagnose MRSA infection nor to guide or monitor treatment for MRSA infections. RESULT CALLED TO, READ BACK BY AND VERIFIED WITH: HEARN,RN AT 2155 ON 5.31.21 BY ISLEY,B Performed at [facility], [address] [place], [place] [address]  Radiology Studies: CT Angio Chest PE W and/or Wo Contrast  Result Date: 09/01/2019 CLINICAL  DATA:  Acute onset shortness of breath today. Recently underwent dialysis. Clinical suspicion for pulmonary embolism. EXAM: CT ANGIOGRAPHY CHEST WITH CONTRAST TECHNIQUE: Multidetector CT imaging of the chest was performed using the standard protocol during bolus administration of intravenous contrast. Multiplanar CT image reconstructions and MIPs were obtained to evaluate the vascular anatomy. CONTRAST:  196mL OMNIPAQUE IOHEXOL 350 MG/ML SOLN COMPARISON:  08/02/2018 FINDINGS: Cardiovascular: Satisfactory opacification of pulmonary arteries noted. Left lower lobe atelectasis limits evaluation the peripheral left lower lobe pulmonary arteries, however no pulmonary emboli identified. No evidence of thoracic aortic dissection or aneurysm. Aortic and coronary artery atherosclerosis noted. Mediastinum/Nodes: No masses or pathologically enlarged lymph nodes identified. Lungs/Pleura: Moderate left and small right pleural effusions are seen. Left lower lobe compressive atelectasis is demonstrated. Mild patchy airspace disease is seen in the superior left lower lobe and posterior left lower lobe, suspicious for pneumonia. An 8 mm pulmonary nodule in the medial left upper lobe on image 47/6 remains stable, consistent with benign etiology. Upper abdomen: No acute findings. Numerous small cysts are again seen in the visualized portions of the kidneys, consistent with cystic disease of dialysis. Musculoskeletal: No suspicious bone lesions identified. Lucency involving the T10 vertebra remains stable. Review of the MIP images confirms the above findings. IMPRESSION: 1. No evidence of pulmonary embolism. 2. Moderate left and small right pleural effusions. 3. Left lower lobe compressive atelectasis. 4. Mild patchy airspace disease in superior left lower lobe and posterior left lower lobe, suspicious for pneumonia. Aortic Atherosclerosis (ICD10-I70.0). Electronically Signed   By: Marlaine Hind M.D.   On: 09/13/2019 15:09   DG Chest  Port 1 View  Result Date: 09/12/2019 CLINICAL DATA:  Shortness of breath EXAM: PORTABLE CHEST 1 VIEW COMPARISON:  Sep 10, 2019 FINDINGS: There is airspace opacity in the left base. There is an area of focal air in either in or overlying this region. Lungs elsewhere clear. Heart size and pulmonary vascularity are normal. No adenopathy. Central catheter tip is in the superior vena cava. No pneumothorax evident. There is aortic atherosclerosis. There is arthropathy in the shoulders with superior migration of the humeral heads. There is a rod in the proximal right humerus. IMPRESSION: Airspace opacity concerning for potential degree of pneumonia left base. Areas either in or overlying the left base region. Question elevation of the left hemidiaphragm with air in this area. This area is less than optimally visualized on this study. It may be prudent to obtain a repeat study with repositioning to better assess this region. Alternatively, CT may be helpful for better anatomic delineation. Heart size within normal limits. Central catheter tip in superior vena cava. Aortic Atherosclerosis (ICD10-I70.0). Electronically Signed   By: Lowella Grip III M.D.   On: 09/23/2019 11:07   DG HIP UNILAT WITH PELVIS 2-3 VIEWS LEFT  Result Date: 09/08/2019 CLINICAL DATA:  Pain EXAM: DG HIP (WITH OR WITHOUT PELVIS) 2-3V LEFT COMPARISON:  None. FINDINGS: Frontal pelvis as well as frontal and lateral left hip images were obtained. Patient has had previous amputation at the proximal femoral diaphysis level on the left. There is air near the stump in this area with skin staples, likely due to recent postoperative change. No bony destruction. No acute fracture or dislocation is appreciable. There is diffuse osteoporosis. There is moderate symmetric narrowing of each hip joint. There is extensive aortic atherosclerosis as well as multiple iliac and femoral arterial foci of  atherosclerotic calcification. IMPRESSION: 1. Diffuse  osteoporosis. 2. No acute fracture or dislocation. 3. Status post amputation of the proximal left femur with postoperative changes in this area. No bony destruction evident. 4. Symmetric narrowing each hip joint. 5. 5. Widespread aortic, iliac, and femoral artery atherosclerotic calcifications bilaterally. Electronically Signed   By: Lowella Grip III M.D.   On: 09/15/2019 11:09        Scheduled Meds: . Chlorhexidine Gluconate Cloth  6 each Topical Daily  . Chlorhexidine Gluconate Cloth  6 each Topical Q0600  . heparin  5,000 Units Subcutaneous Q8H  . hydrocortisone sod succinate (SOLU-CORTEF) inj  100 mg Intravenous Q8H  . insulin aspart  0-6 Units Subcutaneous Q4H  . mupirocin ointment   Nasal BID  . sodium hypochlorite   Topical BID  . [START ON 10/01/2019] sodium hypochlorite   Topical BID  . [START ON 09/29/2019] vancomycin  500 mg Intravenous Q M,W,F-HD   Continuous Infusions: . norepinephrine (LEVOPHED) Adult infusion Stopped (09/17/2019 1850)     LOS: 1 day    Time spent: 35 mins     Kathie Dike, MD [facility]   If 7PM-7AM, please contact night-coverage [email or web address]  09/28/2019, 12:08 PM

## 2019-09-28 NOTE — Consult Note (Signed)
WOC Nurse Consult Note: Patient receiving care in [facility] ICU 8.  Consult completed remotely after review of record and via [place] with assistance by primary RN, Caryl Pina. Reason for Consult: unstageable sacral wound Wound type: stage 4 to sacrum.  It is very large and deep with extensive undermining from 7 to 4 o'clock.  There is a large border of dark brown necrotic tissue from 11 to 2.  There is a heavy foul odor.  This wound will benefit from the effects of Dakin's solution, which I have ordered for a total of 6 days.  There is an eschar on the amputation site of the right forefoot.  I have ordered betadine and cover dressing for this area.  The left stump has intact staples, but is draining serous fluid.  For this wound I have ordered 2 dressings of aquacel and ABD pads. Pressure Injury POA: Yes/No/NA Measurement: Wound bed: Drainage (amount, consistency, odor)  Periwound: Dressing procedure/placement/frequency: Monitor the wound area(s) for worsening of condition such as: Signs/symptoms of infection,  Increase in size,  Development of or worsening of odor, Development of pain, or increased pain at the affected locations.  Notify the medical team if any of these develop.  Thank you for the consult.  Discussed plan of care with the bedside nurse.  [place] nurse will not follow at this time.  Please re-consult the [facility] team if needed.  Val Riles, RN, MSN, CWOCN, CNS-BC, pager [phone]

## 2019-09-28 NOTE — Progress Notes (Signed)
Aerobic blood culture positive for gram negative rods. Dr. Darrick Meigs made aware.

## 2019-09-28 NOTE — Procedures (Signed)
    HEMODIALYSIS TREATMENT NOTE:   Pre HD BP 77/54.  HD was initiated with no UF and order to transfuse 2 units pRBC.  Type and screen was drawn/sent stat.  BP improved after NS bolus was given with treatment initiation.  UF was attempted at minimal rate but he was only able to tolerate this for a few minutes at a time.  Pressures varied; cuff placement options are limited and cuff was repositioned several times.   Albumin was given twice.  HD was discontinued after 1 hour and 52 minutes when BP and RR continued to decline despite NS bolus and the second Albumin administration.  Was hoping to transfuse on HD but HD was stopped after BP decline 66/21---58/22---38/20.    Pt 75/22 after HD was stopped and blood was returned.  Dr. Roderic Palau and Dr. Marval Regal were notified.  PCM NP and chaplain are at pt's bedside talking with family.   Rockwell Alexandria, RN

## 2019-09-28 NOTE — Progress Notes (Signed)
Pharmacy Antibiotic Note  [name]. is a 79 y.o. male admitted on 09/10/2019 with e. coli bacteremia and pneumonia.  Pharmacy has been consulted for Meropenem/Vancomycin dosing.  Plan: Vancomycin 500 mg IV after HD on MWF Meropenem 500 mg IV every 24 hours. Monitor labs, c/s, and vanco level as indicated.  Height: 5\' 5"  (165.1 cm) Weight: 52.7 kg (116 lb 2.9 oz) IBW/kg (Calculated) : 61.5  Temp (24hrs), Avg:97.9 F (36.6 C), Min:97.3 F (36.3 C), Max:98.8 F (37.1 C)  Recent Labs  Lab 09/22/19 0500 09/23/19 0508 09/23/19 0639 09/06/2019 1129 09/07/2019 1144 09/03/2019 1729 09/21/2019 2041 09/28/19 0450  WBC 29.3* 35.1*  --  40.3*  --   --   --  42.0*  CREATININE 2.83*  --  2.09* 2.76* 2.80*  --   --  3.26*  LATICACIDVEN  --   --   --  3.4*  --  1.5 0.9  --     Estimated Creatinine Clearance: 14.4 mL/min (A) (by C-G formula based on SCr of 3.26 mg/dL (H)).    Allergies  Allergen Reactions  . Aspirin Other (See Comments)    Causes internal bleeding  History of ulcers    Antimicrobials this admission: Merrem 6/1 >> Vancomycin 5/31>>  Cefepime 5/31 >> 6/1 Ceftriaxone 2gm IV and Azithromycin 500mg  IV x 1 dose each 5/31 in ED   Microbiology results: 5/31 BCx: gram neg rods 1/2 BCID: e. Coli 5/31 UCx: pending MRSA PCR: positive  Thank you for allowing pharmacy to be a part of this patient's care.  Ramond Craver 09/28/2019 3:11 PM

## 2019-09-28 NOTE — Progress Notes (Signed)
Lady called to speak to patient. Did not identify herself. Called from this number: [phone]. This RN advised caller that patient was not available currently. Caller said she needed to speak to patient because she needed to know if patient was going to give a 30 day notice to give up his apartment. This RN stated caller would need to call patient's daughter regarding any questions. Caller stated she needed to hear it from patient. This RN stated that the patient is currently not medically stable enough to be making any decisions and she would need to call back another time to speak to the patient.

## 2019-09-28 NOTE — TOC Initial Note (Signed)
Transition of Care (TOC) - Initial/Assessment Note    Patient Details  Name: [name] Sr. MRN: [id number] Date of Birth: 10-13-1940  [facility]) CM/SW Contact:    Boneta Lucks, RN Phone Number: 09/28/2019, 2:29 PM  Clinical Narrative:   Patient admitted with severe sepsis. Patient has a high risk for readmission. Patient came in from [facility]. Patient has recent AKA and sacral wound.  Patient getting dialysis today. MD to assess that patient is able to sit in a chair.  Palliative consult needed. TOC spoke with daughter [name], She wants him to return to [facility] and is working on long term bed there.                 Expected Discharge Plan: Skilled Nursing Facility Barriers to Discharge: Continued Medical Work up   Patient Goals and CMS Choice Patient states their goals for this hospitalization and ongoing recovery are:: to return to SNF CMS Medicare.gov Compare Post Acute Care list provided to:: Patient Represenative (must comment) Choice offered to / list presented to : Adult Children  Expected Discharge Plan and Services Expected Discharge Plan: [place] arrangements for the past 2 months: [place]   Prior Living Arrangements/Services Living arrangements for the past 2 months: [place] Lives with:: Facility Resident Patient language and need for interpreter reviewed:: Yes        Need for Family Participation in Patient Care: Yes (Comment) Care giver support system in place?: Yes (comment)   Criminal Activity/Legal Involvement Pertinent to Current Situation/Hospitalization: No - Comment as needed  Activities of Daily Living Home Assistive Devices/Equipment: Wheelchair ADL Screening (condition at time of admission) Patient's cognitive ability adequate to safely complete daily activities?: No Is the patient deaf or have difficulty hearing?: No Does the patient have difficulty  seeing, even when wearing glasses/contacts?: Yes Does the patient have difficulty concentrating, remembering, or making decisions?: Yes Patient able to express need for assistance with ADLs?: No Does the patient have difficulty dressing or bathing?: Yes Independently performs ADLs?: No Communication: Dependent Is this a change from baseline?: Change from baseline, expected to last <3 days Dressing (OT): Dependent Is this a change from baseline?: Change from baseline, expected to last <3days Grooming: Dependent Is this a change from baseline?: Change from baseline, expected to last <3 days Feeding: Dependent Is this a change from baseline?: Change from baseline, expected to last <3 days Bathing: Dependent Is this a change from baseline?: Change from baseline, expected to last <3 days Toileting: Dependent Is this a change from baseline?: Pre-admission baseline In/Out Bed: Dependent Is this a change from baseline?: Change from baseline, expected to last <3 days Walks in Home: Dependent Is this a change from baseline?: Pre-admission baseline Does the patient have difficulty walking or climbing stairs?: Yes Weakness of Legs: Both Weakness of Arms/Hands: Both  Permission Sought/Granted        Emotional Assessment      Alcohol / Substance Use: Not Applicable Psych Involvement: No (comment)  Admission diagnosis:  Respiratory distress [R06.03] Pain [R52] Sepsis ([place]) [A41.9] Severe sepsis ([place]) [A41.9, R65.20] Patient Active Problem List   Diagnosis Date Noted  . Decubitus ulcer of sacral region, stage 4 ([place]) 09/28/2019  . E. coli sepsis ([place]) 09/28/2019  . Severe sepsis with septic shock ([place]) 09/06/2019  . DNR (do not resuscitate) 08/28/2019  . Acute respiratory failure with hypoxia ([place]) 09/11/2019  . HCAP (  healthcare-associated pneumonia) 09/04/2019  . Chronic diastolic CHF (congestive heart failure) ([place]) 09/14/2019  . Bilateral pleural effusion 09/02/2019  . S/P AKA  (above knee amputation), left ([place]) 09/24/2019  . Anemia due to chronic kidney disease 09/09/2019  . Dehiscence of amputation stump ([place])   . Chronic hypotension   . SVT (supraventricular tachycardia) ([place])   . Ventricular ectopy   . Duodenal ulcer   . Palliative care encounter   . DNR (do not resuscitate) discussion   . Goals of care, counseling/discussion   . GI bleeding 09/03/2019  . Bacteremia due to Enterococcus 08/11/2019  . Postoperative wound infection 08/10/2019  . Prolonged QT interval 08/10/2019  . Acute metabolic encephalopathy 34/28/7681  . Thrombocytosis ([place]) 08/10/2019  . Fall   . Closed intertrochanteric fracture of right hip ([place]) 06/30/2019  . Hip fracture ([place]) 06/30/2019  . Severe protein-calorie malnutrition ([place]) 06/18/2018  . Severe sepsis ([place]) 06/12/2018  . Leukocytosis 06/12/2018  . MRSA (methicillin resistant staph aureus) culture positive 06/12/2018  . Essential hypertension 06/12/2018  . S/P transmetatarsal amputation of foot, right ([place]) 05/09/2017  . PVD (peripheral vascular disease) ([place]) 04/15/2017  . Nonischemic cardiomyopathy ([place]) 08/15/2015  . Aortic stenosis 08/15/2015  . Moderate aortic stenosis 07/12/2015  . 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 CAD- 40-50% LAD at cath 07/11/15 07/11/2015  . Aspirin intolerance 07/11/2015  . Abnormal stress test   . ESRD on dialysis ([place]) 05/30/2015  . CTS (carpal tunnel syndrome) 02/28/2015  . PVD of LE - Dr Oneida Alar follows 08/20/2012  . Midfoot skin ulcer, right, limited to breakdown of skin ([place]) 01/16/2012  . Encounter for central line placement 10/08/2011  . Melena 10/08/2011  . Other complications due to renal dialysis device, implant, and graft 09/24/2011   PCP:  [name], MD Pharmacy:   [facility] (ESRD Bundle Only) - [place], [address] [place] [address] Phone: [phone] Fax: [phone]   Readmission Risk  Interventions Readmission Risk Prevention Plan 09/28/2019 09/10/2019 09/08/2019  Transportation Screening Complete Complete -  PCP or Specialist Appt within 3-5 Days - - -  HRI or [place] for [place] - - Complete  Medication Review (RN Care Manager) Complete - -  PCP or Specialist appointment within 3-5 days of discharge Not Complete - -  HRI or [place] Not Complete - -  SW Recovery Care/Counseling Consult Complete - -  Palliative Care Screening Not Complete - -  Skilled Nursing Facility Complete - -  Some recent data might be hidden

## 2019-09-28 NOTE — Consult Note (Addendum)
Consultation Note Date: 09/28/2019   Patient Name: [name]  DOB: 1941-03-09  MRN: [id number]  Age / Sex: 79 y.o., male  PCP: [name], MD Referring Physician: Kathie Dike, MD  Reason for Consultation: Establishing goals of care  HPI/Patient Profile: 79 y.o. male  with past medical history of ESRD on HD MWF, PVD, s/p left AKA, s/p right transmetatarsal amputation, HFpEF, chronic hypotension on midodrine, overall failure to thrive, sacral decubitus ulcer admitted on 09/24/2019 with shortness of breath at dialysis center and found to have bilateral pleural effusions and left lower lobe pneumonia sepsis. Poor candidate for CRRT due to overall poor prognosis with failure to thrive. Attempt for iHD today but did not tolerate yesterday. Palliative consulted to assist with goals of care conversation as he is unlikely to tolerate further dialysis and consideration of comfort care would be appropriate.   Clinical Assessment and Goals of Care: I have reviewed electronic records, diagnostics, results, notes. I have also discussed current status and plan with bedside RN Caryl Pina as well as Dr. Roderic Palau.   I met today at [name] bedside along with his daughter, [name], and son, [name]. [name] is very tearful prior to my visit. We discussed overall poor prognosis and I explained that I am very concerned about [name]. I am concerned that his heart and his body will not tolerate dialysis and they understand that dialysis may not be an option moving forward if he does not tolerate today. We also discussed his advanced sacral wound and that this will be very difficult to manage. Discussed that even best case that he tolerates dialysis that this wound is going to become more of an issue and not good to be sitting up on a wound for dialysis multiple hours during the week. My fear is that he will continue to decline and that his body  is failing now.   I encouraged family to consider [name] wishes and his values as a person and what is important to him. I encouraged them to consider if the interventions that we are now providing is something he would desire knowing that this will provide him poor quality of life or if we are crossing over into a territory where the interventions we provide are only causing him more pain and suffering. I allowed family some space and privacy to consider this.   I returned later today and daughter, [name], remains at bedside. [name] continues on dialysis but with quickly declining blood pressure down to 38/20 and he appears with worsening overall status as well. I discussed with [name] my concern for him. I clarified with her that if he were to further decline this evening/tonight if we should provide vasopressors/etc to escalate to more invasive/aggressive care and she clearly tells me that this is NOT what he would want. At this time we agreed to proceed with blood transfusion and I expressed my hopes that this may buy [place] a little time but I do feel this is a small bandaid over  a larger issue for his overall status and decline. I encouraged her to call in family to spend time with him as prognosis is poor and he does appeared declined since this morning - dialysis has taken a lot out of him. Will encourage family visitation, ensure comfort, and proceed with blood transfusion but overall goal is that he does not suffer.   All questions/concerns addressed. Emotional support provided. Discussed with RN and Dr. Roderic Palau and chaplain called for support.   Primary Decision Maker NEXT OF KIN children [name] and [name].     SUMMARY OF RECOMMENDATIONS   - DNR already decided by patient and verified with family at bedside.  - Not tolerating dialysis and not a good option moving forward.  - Goal moving forward decided today to be focus on comfort and allowing visitation from family as [name] is facing end of  life. No escalation of care but continue antibiotics and proceed with blood transfusion. Will reassess tomorrow.   Code Status/Advance Care Planning:  DNR   Symptom Management:   As needed medications added to ensure comfort at end of life.   Palliative Prophylaxis:   Delirium Protocol, Frequent Pain Assessment, Oral Care and Turn Reposition  Psycho-social/Spiritual:   Desire for further Chaplaincy support:yes  Additional Recommendations: Grief/Bereavement Support  Prognosis:   Hours - Days most likely  Discharge Planning: Most likely hospital death.      Primary Diagnoses: Present on Admission:  Severe sepsis with septic shock ([place])  PVD (peripheral vascular disease) ([place])  Leukocytosis  Acute metabolic encephalopathy  DNR (do not resuscitate)  Acute respiratory failure with hypoxia (HCC)  HCAP (healthcare-associated pneumonia)  Chronic hypotension  Chronic diastolic CHF (congestive heart failure) (HCC)  Bilateral pleural effusion  Anemia due to chronic kidney disease   I have reviewed the medical record, interviewed the patient and family, and examined the patient. The following aspects are pertinent.  Past Medical History:  Diagnosis Date   Acute osteomyelitis of left foot ([place]) 07/21/2019   Anxiety    Aortic stenosis    Arthritis    Closed avulsion fracture of right hip ([place]) 06/30/2019   ESRD (end stage renal disease) on dialysis [facility])    M/W/F at [facility] in [place] hypertension    Gangrene of left foot ([place]) 06/04/2018   Gangrene of right foot ([place])    Gastric ulcer 2004   GI bleed    GI bleeding 08/2019   History of blood transfusion    History of cardiomyopathy    LVEF normal as of February 2017   History of gastric ulcer    History of stroke    Left side weakness   Iron deficiency anemia    Osteomyelitis (HCC)    Peripheral vascular disease (HCC)    Social History   Socioeconomic History    Marital status: Single    Spouse name: Not on file   Number of children: 2   Years of education: Not on file   Highest education level: Not on file  Occupational History   Occupation: retired, Licensed conveyancer    Employer: RETIRED  Tobacco Use   Smoking status: Former Smoker    Years: 25.00    Quit date: 04/29/2004    Years since quitting: 15.4   Smokeless tobacco: Former Systems developer    Types: Chew    Quit date: 01/16/1987   Tobacco comment: quit 2006  Substance and Sexual Activity   Alcohol use: No   Drug use: No  Sexual activity: Not on file  Other Topics Concern   Not on file  Social History Narrative   Lives alone   Daughter 20-min away   Caffeine use: 32oz soda per day   Social Determinants of Health   Financial Resource Strain:    Difficulty of Paying Living Expenses:   Food Insecurity:    Worried About Charity fundraiser in the Last Year:    Arboriculturist in the Last Year:   Transportation Needs:    Film/video editor (Medical):    Lack of Transportation (Non-Medical):   Physical Activity:    Days of Exercise per Week:    Minutes of Exercise per Session:   Stress:    Feeling of Stress :   Social Connections:    Frequency of Communication with Friends and Family:    Frequency of Social Gatherings with Friends and Family:    Attends Religious Services:    Active Member of Clubs or Organizations:    Attends Music therapist:    Marital Status:    Family History  Problem Relation Age of Onset   Hypertension Mother    Colon cancer Neg Hx    Liver disease Neg Hx    Scheduled Meds:  Chlorhexidine Gluconate Cloth  6 each Topical Daily   dextrose       heparin  5,000 Units Subcutaneous Q8H   hydrocortisone sod succinate (SOLU-CORTEF) inj  100 mg Intravenous Q8H   insulin aspart  0-6 Units Subcutaneous Q4H   mupirocin ointment   Nasal BID   [START ON 09/29/2019] vancomycin  500 mg Intravenous Q M,W,F-HD   Continuous  Infusions:  ceFEPime (MAXIPIME) IV Stopped (09/25/2019 1818)   norepinephrine (LEVOPHED) Adult infusion Stopped (09/14/2019 1850)   PRN Meds:.acetaminophen **OR** acetaminophen Allergies  Allergen Reactions   Aspirin Other (See Comments)    Causes internal bleeding  History of ulcers   Review of Systems  Unable to perform ROS: Acuity of condition    Physical Exam Vitals and nursing note reviewed.  Constitutional:      General: He is not in acute distress.    Appearance: He is cachectic. He is ill-appearing.     Comments: Restless   Cardiovascular:     Rate and Rhythm: Normal rate.  Pulmonary:     Effort: Pulmonary effort is normal. No tachypnea, accessory muscle usage or respiratory distress.  Abdominal:     General: Abdomen is flat.     Palpations: Abdomen is soft.  Neurological:     Mental Status: He is lethargic.     Vital Signs: BP 111/72    Pulse (!) 42    Temp 98.3 F (36.8 C) (Axillary)    Resp (!) 27    Ht '5\' 5"'$  (1.651 m)    Wt 52.6 kg    SpO2 93%    BMI 19.30 kg/m  Pain Scale: 0-10 POSS *See Group Information*: S-Acceptable,Sleep, easy to arouse Pain Score: 0-No pain   SpO2: SpO2: 93 % O2 Device:SpO2: 93 % O2 Flow Rate: .O2 Flow Rate (L/min): 15 L/min  IO: Intake/output summary:   Intake/Output Summary (Last 24 hours) at 09/28/2019 0911 Last data filed at 09/28/2019 0751 Gross per 24 hour  Intake 2199.86 ml  Output --  Net 2199.86 ml    LBM: Last BM Date: 09/18/2019 Baseline Weight: Weight: 52.2 kg Most recent weight: Weight: 52.6 kg     Palliative Assessment/Data: 20%     Time In/Out: 1300-1330, 1500-1550  Time Total: 80 min Greater than 50%  of this time was spent counseling and coordinating care related to the above assessment and plan.  Signed by: Vinie Sill, NP Palliative Medicine Team Pager # [phone] (M-F 8a-5p) Team Phone # [phone] (Nights/Weekends)

## 2019-09-28 NOTE — Consult Note (Signed)
[facility] Renal Consultation Note    Indication for Consultation:  Management of ESRD/hemodialysis; anemia, hypertension/volume and secondary hyperparathyroidism  HPI: Joseph MCGROARTY Sr. is a 79 y.o. male with a PMH significant for ESRD on HD MWF at [facility]. 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  He was discharged from [facility] on 09/23/19 to SNF but only went to one outpatient HD session complicated by hypotension and yelling in pain (per HD nursing staff).  He was sent from his HD unit 09/11/2019 due to shortness of breath and AMS.  In the ED he was noted to be hypotensive with SBP in the 60's, hypoxic, ad CT angio of chest negative for PE but did show bilateral pleural effusions and left-sided pneumonia.  He was admitted for IV antibiotics and IVF's per sepsis protocol.  We were consulted to help provide HD during his hospitalization.  He is currently unresponsive and this HPI was obtained by review of existing medical records and discussion with Dr. Roderic Palau.  Nursing staff also noted an  unstageable sacral decubitus ulcer.  He is currently DNR and the family was notified of his poor overall prognosis.   Past Medical History:  Diagnosis Date  . Acute osteomyelitis of left foot ([place]) 07/21/2019  . Anxiety   . Aortic stenosis   . Arthritis   . Closed avulsion fracture of right hip ([place]) 06/30/2019  . ESRD (end stage renal disease) on dialysis [facility])    M/W/F at [facility] in [place]  . Essential hypertension   . Gangrene of left foot ([place]) 06/04/2018  . Gangrene of right foot ([place])   . Gastric ulcer 2004  . GI bleed   . GI bleeding 08/2019  . History of blood transfusion   . History of cardiomyopathy    LVEF normal as of February 2017  . History of gastric ulcer   . History of  stroke    Left side weakness  . Iron deficiency anemia   . Osteomyelitis ([place])   . Peripheral vascular disease [facility])    Past Surgical History:  Procedure Laterality Date  . ABDOMINAL AORTAGRAM N/A 01/24/2012   Procedure: ABDOMINAL Maxcine Ham;  Surgeon: Elam Dutch, MD;  Location: [facility] CATH LAB;  Service: Cardiovascular;  Laterality: N/A;  . ABDOMINAL AORTOGRAM W/LOWER EXTREMITY N/A 04/15/2017   Procedure: ABDOMINAL AORTOGRAM W/LOWER EXTREMITY;  Surgeon: Angelia Mould, MD;  Location: [place] CV LAB;  Service: Cardiovascular;  Laterality: N/A;  . AMPUTATION Right 05/09/2017   Procedure: RIGHT TRANSMETATARSAL AMPUTATION;  Surgeon: [name], MD;  Location: [place];  Service: Orthopedics;  Laterality: Right;  . AMPUTATION Right 06/13/2018   Procedure: AMPUTATION RIGHT LONG FINGER;  Surgeon: Dayna Barker, MD;  Location: [place];  Service: Plastics;  Laterality: Right;  . AMPUTATION Left 07/21/2019   Procedure: LEFT TRANSMETATARSAL AMPUTATION;  Surgeon: [name], MD;  Location: [place];  Service: Orthopedics;  Laterality: Left;  . AMPUTATION Left 08/13/2019   Procedure: AMPUTATION BELOW KNEE;  Surgeon: [name], MD;  Location: [place];  Service: Orthopedics;  Laterality: Left;  . AMPUTATION Left 09/08/2019   Procedure: LEFT ABOVE KNEE AMPUTATION;  Surgeon: [name], MD;  Location: [place];  Service: Orthopedics;  Laterality: Left;  . ARTERIOVENOUS GRAFT PLACEMENT Right right arm  . AV FISTULA PLACEMENT Left  08/31/2015   Procedure: ARTERIOVENOUS (AV) FISTULA CREATION- LEFT RADIOCEPHALIC;  Surgeon: Mal Misty, MD;  Location: [place];  Service: Vascular;  Laterality: Left;  . AV FISTULA PLACEMENT Right 02/18/2017   Procedure: INSERTION OF ARTERIOVENOUS (AV) GORE-TEX GRAFT  RIGHT UPPER ARM;  Surgeon: Conrad Imperial, MD;  Location: [place];  Service: Vascular;  Laterality: Right;  . AV FISTULA PLACEMENT Left 12/01/2018   Procedure: INSERTION OF ARTERIOVENOUS (AV) GORE-TEX GRAFT  ARM;  Surgeon: Angelia Mould, MD;  Location: [place];  Service: Vascular;  Laterality: Left;  . AV FISTULA PLACEMENT Left 02/09/2019   Procedure: INSERTION OF ARTERIOVENOUS (AV) GORE-TEX GRAFT LEFT THIGH;  Surgeon: Waynetta Sandy, MD;  Location: [place];  Service: Vascular;  Laterality: Left;  . BASCILIC VEIN TRANSPOSITION Left 12/19/2015   Procedure: FIRST STAGE BRACHIAL VEIN TRANSPOSITION;  Surgeon: Conrad [place] Grove, MD;  Location: [place];  Service: Vascular;  Laterality: Left;  . BASCILIC VEIN TRANSPOSITION Left 02/08/2016   Procedure: SECOND STAGE BRACHIAL VEIN TRANSPOSITION;  Surgeon: Conrad Elwood, MD;  Location: [place];  Service: Vascular;  Laterality: Left;  . BIOPSY  09/04/2019   Procedure: BIOPSY;  Surgeon: Thornton Park, MD;  Location: [place];  Service: Gastroenterology;;  . Kathleen Argue STUDY  08/16/2019   Procedure: BUBBLE STUDY;  Surgeon: Elouise Munroe, MD;  Location: [place];  Service: Cardiovascular;;  . CARDIAC CATHETERIZATION N/A 07/11/2015   Procedure: Left Heart Cath and Coronary Angiography;  Surgeon: Troy Sine, MD;  Location: [place] CV LAB;  Service: Cardiovascular;  Laterality: N/A;  . Carpel Tunnel Left Dec. 22, 2016  . CENTRAL LINE INSERTION N/A 09/03/2019   Procedure: CENTRAL LINE INSERTION;  Surgeon: Juanito Doom, MD;  Location: [place];  Service: Cardiopulmonary;  Laterality: N/A;  . CHOLECYSTECTOMY    . COLONOSCOPY  2004   Dr. Irving Shows, left sided diverticula and cecal polyp, path unknown  . COLONOSCOPY  10/29/2011   Procedure: COLONOSCOPY;  Surgeon: Daneil Dolin, MD;  [facility];  Service: Endoscopy;  Laterality: N/A;  10:15  . ESOPHAGOGASTRODUODENOSCOPY  11/2002   Dr. Gala Romney, erosive reflux esophagitis, multiple gastric ulcer and antral/bulbar erosions. Serologies positive for H.Pylori and was treated  . ESOPHAGOGASTRODUODENOSCOPY  11/20014   Dr. Gala Romney, small hh only, ulcers healed  . ESOPHAGOGASTRODUODENOSCOPY   09/21/2011   Dr Trevor Iha [facility], antral erosions, ?early GAVE  . ESOPHAGOGASTRODUODENOSCOPY (EGD) WITH PROPOFOL N/A 09/04/2019   Procedure: ESOPHAGOGASTRODUODENOSCOPY (EGD) WITH PROPOFOL;  Surgeon: Thornton Park, MD;  Location: [place];  Service: Gastroenterology;  Laterality: N/A;  . FISTULOGRAM Left 12/10/2016   Procedure: THROMBECTOMY OF LEFT ARM ARTERIOVENOUS FISTULA;  Surgeon: Waynetta Sandy, MD;  Location: [place];  Service: Vascular;  Laterality: Left;  . INSERTION OF DIALYSIS CATHETER Left 12/10/2016   Procedure: INSERTION OF TUNNELED DIALYSIS CATHETER;  Surgeon: Waynetta Sandy, MD;  Location: [place];  Service: Vascular;  Laterality: Left;  . INSERTION OF DIALYSIS CATHETER Right 06/09/2018   Procedure: INSERTION OF DIALYSIS CATHETER Right subclavian;  Surgeon: Angelia Mould, MD;  Location: [place];  Service: Vascular;  Laterality: Right;  . IR DIALY SHUNT INTRO [place] W/IMG RIGHT Right 01/01/2018  . IR FLUORO GUIDE CV LINE LEFT  09/10/2019  . IR FLUORO GUIDE CV LINE RIGHT  09/16/2019  . IR GENERIC HISTORICAL  07/16/2016   IR REMOVAL TUN CV CATH W/O FL 07/16/2016 Saverio Danker, PA-C [facility]-INTERV RAD  . IR PTA ADDL CENTRAL DIALYSIS SEG THRU  DIALY CIRCUIT RIGHT Right 10/21/2017  . IR REMOVAL TUN CV CATH W/O FL  05/12/2017  . IR THROMBECTOMY AV FISTULA W/THROMBOLYSIS/PTA INC/SHUNT/IMG RIGHT Right 10/21/2017  . IR THROMBECTOMY AV FISTULA W/THROMBOLYSIS/PTA INC/SHUNT/IMG RIGHT Right 11/27/2017  . IR US GUIDE VASC ACCESS LEFT  09/10/2019  . IR US GUIDE VASC ACCESS RIGHT  10/21/2017  . IR US GUIDE VASC ACCESS RIGHT  11/27/2017  . IR US GUIDE VASC ACCESS RIGHT  01/01/2018  . IR US GUIDE VASC ACCESS RIGHT  09/16/2019  . LIGATION ARTERIOVENOUS GORTEX GRAFT Right 06/09/2018  . LIGATION ARTERIOVENOUS GORTEX GRAFT Right 06/09/2018   Procedure: LIGATION ARTERIOVENOUS GORTEX GRAFT RIGHT ARM;  Surgeon: Angelia Mould, MD;  Location: [place];  Service: Vascular;   Laterality: Right;  . LIGATION ARTERIOVENOUS GORTEX GRAFT Left 12/31/2018   Procedure: LIGATION OF LEFT UPPER ARM ARTERIVENOUS GORTEX GRAFT, LEFT BRACHIAL ARTERY ENDARTERECTOMY WITH BOVINE PATCH ANGIOPLASTY;  Surgeon: Waynetta Sandy, MD;  Location: [place];  Service: Vascular;  Laterality: Left;  . LIGATION OF ARTERIOVENOUS  FISTULA Left 12/19/2015   Procedure: LIGATION OF RADIOCEPHALIC ARTERIOVENOUS  FISTULA;  Surgeon: Conrad Loretto, MD;  Location: [place];  Service: Vascular;  Laterality: Left;  Marland Kitchen MASS EXCISION Right 02/18/2017   Procedure: EXCISION OF RIGHT AXILLARY EPIDERMAL INCLUSION CYST;  Surgeon: Conrad Prague, MD;  Location: [place];  Service: Vascular;  Laterality: Right;  . PERIPHERAL VASCULAR CATHETERIZATION N/A 12/14/2015   Procedure: Fistulagram;  Surgeon: Conrad [place], MD;  Location: [place] CV LAB;  Service: Cardiovascular;  Laterality: N/A;  . SHOULDER SURGERY Right    fracture  . TEE WITHOUT CARDIOVERSION N/A 08/16/2019   Procedure: TRANSESOPHAGEAL ECHOCARDIOGRAM (TEE);  Surgeon: Elouise Munroe, MD;  Location: [place];  Service: Cardiovascular;  Laterality: N/A;  . ULTRASOUND GUIDANCE FOR VASCULAR ACCESS  04/15/2017   Procedure: Ultrasound Guidance For Vascular Access;  Surgeon: Angelia Mould, MD;  Location: [place] CV LAB;  Service: Cardiovascular;;  . UPPER EXTREMITY VENOGRAPHY Bilateral 12/17/2016   Procedure: Bilateral Upper Extremity Venography;  Surgeon: Serafina Mitchell, MD;  Location: [place] CV LAB;  Service: Cardiovascular;  Laterality: Bilateral;  . UPPER EXTREMITY VENOGRAPHY Left 11/03/2018   Procedure: UPPER EXTREMITY VENOGRAPHY;  Surgeon: Serafina Mitchell, MD;  Location: [place] CV LAB;  Service: Cardiovascular;  Laterality: Left;   Family History:   Family History  Problem Relation Age of Onset  . Hypertension Mother   . Colon cancer Neg Hx   . Liver disease Neg Hx    Social History:  reports that he quit smoking about 15  years ago. He quit after 25.00 years of use. He quit smokeless tobacco use about 32 years ago.  His smokeless tobacco use included chew. He reports that he does not drink alcohol or use drugs. Allergies  Allergen Reactions  . Aspirin Other (See Comments)    Causes internal bleeding  History of ulcers   Prior to Admission medications   Medication Sig Start Date End Date Taking? Authorizing Provider  Amino Acids-Protein Hydrolys (FEEDING SUPPLEMENT, PRO-STAT SUGAR FREE 64,) LIQD Take 30 mLs by mouth in the morning and at bedtime.    Yes [provider]  ascorbic acid (VITAMIN C) 500 MG tablet Take 500 mg by mouth daily.   Yes [provider]  atorvastatin (LIPITOR) 40 MG tablet Take 40 mg by mouth daily.  11/03/15  Yes [provider]  cinacalcet (SENSIPAR) 60 MG tablet Take 60 mg by mouth at  bedtime.    Yes [provider]  melatonin 3 MG TABS tablet Take 3 mg by mouth at bedtime.    Yes [provider]  midodrine (PROAMATINE) 10 MG tablet Take 1 tablet (10 mg total) by mouth 3 (three) times daily. 09/20/19 10/20/19 Yes Elgergawy, Silver Huguenin, MD  mirtazapine (REMERON) 7.5 MG tablet Take 7.5 mg by mouth at bedtime.    Yes [provider]  Multiple Vitamin (MULTIVITAMIN) tablet Take 1 tablet by mouth daily.   Yes [provider]  Nutritional Supplements (FEEDING SUPPLEMENT, NEPRO CARB STEADY,) LIQD Take 237 mLs by mouth 2 (two) times daily between meals. Patient taking differently: Take 237 mLs by mouth in the morning and at bedtime.  07/22/19  Yes Persons, Bevely Palmer, [place]  oxyCODONE (OXY IR/ROXICODONE) 5 MG immediate release tablet Take 1 tablet (5 mg total) by mouth every 6 (six) hours as needed for severe pain (5 mg mild pain/ 10 mg moderate pain). 09/23/19  Yes Ghimire, Henreitta Leber, MD  pantoprazole (PROTONIX) 40 MG tablet Take 1 tablet (40 mg total) by mouth 2 (two) times daily. 09/20/19  Yes Elgergawy, Silver Huguenin, MD  Zinc 50 MG CAPS Take 1  capsule by mouth daily.   Yes [provider]  acetaminophen (TYLENOL) 325 MG tablet Take 2 tablets (650 mg total) by mouth every 6 (six) hours as needed for mild pain (or Fever >/= 101). 09/18/19   Elgergawy, Silver Huguenin, MD  B Complex-C (B-COMPLEX WITH VITAMIN C) tablet Take 1 tablet by mouth daily.     [provider]  cholecalciferol (VITAMIN D3) 25 MCG (1000 UT) tablet Take 1,000 Units by mouth daily.     [provider]  clopidogrel (PLAVIX) 75 MG tablet Take 1 tablet (75 mg total) by mouth daily. Start on 5/25 09/22/19   Elgergawy, Silver Huguenin, MD  sennosides-docusate sodium (SENOKOT-S) 8.6-50 MG tablet Take 1 tablet by mouth daily.     [provider]   Current Facility-Administered Medications  Medication Dose Route Frequency Provider Last Rate Last Admin  . acetaminophen (TYLENOL) tablet 650 mg  650 mg Oral Q6H PRN Kathie Dike, MD       Or  . acetaminophen (TYLENOL) suppository 650 mg  650 mg Rectal Q6H PRN Kathie Dike, MD      . ceFEPIme (MAXIPIME) 2 g in sodium chloride 0.9 % 100 mL IVPB  2 g Intravenous Q M,W,F-HD Kathie Dike, MD   Stopped at 09/08/2019 1818  . Chlorhexidine Gluconate Cloth 2 % PADS 6 each  6 each Topical Daily Memon, Jehanzeb, MD      . dextrose 50 % solution           . heparin injection 5,000 Units  5,000 Units Subcutaneous Q8H Kathie Dike, MD   5,000 Units at 09/28/19 0616  . hydrocortisone sodium succinate (SOLU-CORTEF) 100 MG injection 100 mg  100 mg Intravenous Q8H Kathie Dike, MD   100 mg at 09/28/19 0127  . insulin aspart (novoLOG) injection 0-6 Units  0-6 Units Subcutaneous Q4H Lama, Marge Duncans, MD      . mupirocin ointment (BACTROBAN) 2 %   Nasal BID Kathie Dike, MD      . norepinephrine (LEVOPHED) 4mg  in 23mL premix infusion  0-40 mcg/min Intravenous Titrated Kathie Dike, MD   Stopped at 09/13/2019 1850  . [START ON 09/29/2019] vancomycin (VANCOCIN) IVPB 500 mg/100 ml premix  500 mg Intravenous Q M,W,F-HD  Kathie Dike, MD       Labs:  Basic Metabolic Panel: Recent Labs  Lab 09/22/19 0500 09/22/19 0500 09/23/19 0639 09/23/19 0639 09/19/2019 1129 09/06/2019 1144 09/28/19 0450  NA 137   < > 135   < > 138 137 138  K 3.4*   < > 5.1   < > 3.2* 3.2* 3.5  CL 100   < > 100   < > 99 101 100  CO2 27   < > 24  --  25  --  26  GLUCOSE 106*   < > 93   < > 97 85 104*  BUN 19   < > 15   < > 26* 23 34*  CREATININE 2.83*   < > 2.09*   < > 2.76* 2.80* 3.26*  CALCIUM 7.5*   < > 7.9*  --  8.5*  --  8.6*  PHOS 1.9*  --  1.9*  --   --   --   --    < > = values in this interval not displayed.   Liver Function Tests: Recent Labs  Lab 09/23/19 0639 08/28/2019 1129 09/28/19 0450  AST  --  26 18  ALT  --  6 7  ALKPHOS  --  70 58  BILITOT  --  1.6* 1.1  PROT  --  6.7 5.8*  ALBUMIN 2.2* 2.1* 1.7*   No results for input(s): LIPASE, AMYLASE in the last 168 hours. No results for input(s): AMMONIA in the last 168 hours. CBC: Recent Labs  Lab 09/22/19 0500 09/22/19 0500 09/23/19 0508 09/23/19 0508 09/26/2019 1129 09/26/2019 1144 09/28/19 0450  WBC 29.3*   < > 35.1*  --  40.3*  --  42.0*  HGB 7.9*   < > 9.9*   < > 8.6* 11.2* 7.9*  HCT 25.1*   < > 32.1*   < > 27.9* 33.0* 24.9*  MCV 67.5*  --  68.9*  --  66.9*  --  65.5*  PLT 329   < > PLATELET CLUMPS NOTED ON SMEAR, COUNT APPEARS ADEQUATE  --  353  --  293   < > = values in this interval not displayed.   Cardiac Enzymes: No results for input(s): CKTOTAL, CKMB, CKMBINDEX, TROPONINI in the last 168 hours. CBG: Recent Labs  Lab 09/28/19 0631 09/28/19 0741  GLUCAP 76 95   Iron Studies: No results for input(s): IRON, TIBC, TRANSFERRIN, FERRITIN in the last 72 hours. Studies/Results: CT Angio Chest PE W and/or Wo Contrast  Result Date: 09/07/2019 CLINICAL DATA:  Acute onset shortness of breath today. Recently underwent dialysis. Clinical suspicion for pulmonary embolism. EXAM: CT ANGIOGRAPHY CHEST WITH CONTRAST TECHNIQUE: Multidetector CT imaging of  the chest was performed using the standard protocol during bolus administration of intravenous contrast. Multiplanar CT image reconstructions and MIPs were obtained to evaluate the vascular anatomy. CONTRAST:  168mL OMNIPAQUE IOHEXOL 350 MG/ML SOLN COMPARISON:  08/02/2018 FINDINGS: Cardiovascular: Satisfactory opacification of pulmonary arteries noted. Left lower lobe atelectasis limits evaluation the peripheral left lower lobe pulmonary arteries, however no pulmonary emboli identified. No evidence of thoracic aortic dissection or aneurysm. Aortic and coronary artery atherosclerosis noted. Mediastinum/Nodes: No masses or pathologically enlarged lymph nodes identified. Lungs/Pleura: Moderate left and small right pleural effusions are seen. Left lower lobe compressive atelectasis is demonstrated. Mild patchy airspace disease is seen in the superior left lower lobe and posterior left lower lobe, suspicious for pneumonia. An 8 mm pulmonary nodule in the medial left upper lobe on image 47/6 remains stable, consistent with benign etiology. Upper abdomen:  No acute findings. Numerous small cysts are again seen in the visualized portions of the kidneys, consistent with cystic disease of dialysis. Musculoskeletal: No suspicious bone lesions identified. Lucency involving the T10 vertebra remains stable. Review of the MIP images confirms the above findings. IMPRESSION: 1. No evidence of pulmonary embolism. 2. Moderate left and small right pleural effusions. 3. Left lower lobe compressive atelectasis. 4. Mild patchy airspace disease in superior left lower lobe and posterior left lower lobe, suspicious for pneumonia. Aortic Atherosclerosis (ICD10-I70.0). Electronically Signed   By: Marlaine Hind M.D.   On: 09/15/2019 15:09   DG Chest Port 1 View  Result Date: 09/24/2019 CLINICAL DATA:  Shortness of breath EXAM: PORTABLE CHEST 1 VIEW COMPARISON:  Sep 10, 2019 FINDINGS: There is airspace opacity in the left base. There is an  area of focal air in either in or overlying this region. Lungs elsewhere clear. Heart size and pulmonary vascularity are normal. No adenopathy. Central catheter tip is in the superior vena cava. No pneumothorax evident. There is aortic atherosclerosis. There is arthropathy in the shoulders with superior migration of the humeral heads. There is a rod in the proximal right humerus. IMPRESSION: Airspace opacity concerning for potential degree of pneumonia left base. Areas either in or overlying the left base region. Question elevation of the left hemidiaphragm with air in this area. This area is less than optimally visualized on this study. It may be prudent to obtain a repeat study with repositioning to better assess this region. Alternatively, CT may be helpful for better anatomic delineation. Heart size within normal limits. Central catheter tip in superior vena cava. Aortic Atherosclerosis (ICD10-I70.0). Electronically Signed   By: Lowella Grip III M.D.   On: 08/29/2019 11:07   DG HIP UNILAT WITH PELVIS 2-3 VIEWS LEFT  Result Date: 09/16/2019 CLINICAL DATA:  Pain EXAM: DG HIP (WITH OR WITHOUT PELVIS) 2-3V LEFT COMPARISON:  None. FINDINGS: Frontal pelvis as well as frontal and lateral left hip images were obtained. Patient has had previous amputation at the proximal femoral diaphysis level on the left. There is air near the stump in this area with skin staples, likely due to recent postoperative change. No bony destruction. No acute fracture or dislocation is appreciable. There is diffuse osteoporosis. There is moderate symmetric narrowing of each hip joint. There is extensive aortic atherosclerosis as well as multiple iliac and femoral arterial foci of atherosclerotic calcification. IMPRESSION: 1. Diffuse osteoporosis. 2. No acute fracture or dislocation. 3. Status post amputation of the proximal left femur with postoperative changes in this area. No bony destruction evident. 4. Symmetric narrowing each  hip joint. 5. 5. Widespread aortic, iliac, and femoral artery atherosclerotic calcifications bilaterally. Electronically Signed   By: Lowella Grip III M.D.   On: 09/11/2019 11:09    ROS: Review of systems not obtained due to patient factors. Physical Exam: Vitals:   09/28/19 0628 09/28/19 0630 09/28/19 0730 09/28/19 0742  BP:   111/72   Pulse:  60 (!) 47 (!) 42  Resp:  18 (!) 21 (!) 27  Temp:    98.3 F (36.8 C)  TempSrc:    Axillary  SpO2:    93%  Weight: 52.6 kg     Height:          Weight change:   Intake/Output Summary (Last 24 hours) at 09/28/2019 0851 Last data filed at 09/28/2019 0751 Gross per 24 hour  Intake 2199.86 ml  Output --  Net 2199.86 ml   BP 111/72  Pulse (!) 42   Temp 98.3 F (36.8 C) (Axillary)   Resp (!) 27   Ht 5\' 5"  (1.651 m)   Wt 52.6 kg   SpO2 93%   BMI 19.30 kg/m  General appearance: cachectic and unresponsive Head: atraumatic, bitemporal wasting Resp: diminished breath sounds bibasilar Cardio: bradycardic at 42, no rub GI: soft, non-tender; bowel sounds normal; no masses,  no organomegaly Extremities: edema 2+ edema of LUE, 1+ of RUE, s/p left AKA, s/p right TMA, and clotted LUE AVF Neuro:  unresponsive Dialysis Access:  Dialysis Orders: Center: [facility]  on MWF . EDW 53 kg HD Bath 2K/2.5Ca  Time 4 hours Heparin  500 bolus, 2,000/hr. Access RIJ TDC (clotted LUE AVF) BFR 400 DFR 800   Temp 36 Sensipar 60 mg po with HD Epogen 4,000   Units IV/HD  Venofer  50 mg IV qweek  Midodrine prior to HD  Assessment/Plan: 1.  Severe sepsis syndrome presumably due to HCAP- unable to get central line access for pressors.  Responded to IVF boluses.  Started on vanc and cefepime per primary 2.  ESRD -  Too unstable for IHD yesterday.  Will attempt today.  If unable to tolerate due to hypotension would recommend palliative care consult to transition to comfort care.  I don't think transfer to [facility] for [facility] would significantly impact his overall  outcome given his ongoing issues with hypotension, sacral decubitus ulcer, PAD, and failure to thrive.   3. Acute hypoxic respiratory failure- due to HCAP.  Pt is DNR.  Was on 100% non-rebreather with improvement but now hypoxic at 85%.  Continue with supportive care  4. Hypertension/volume  - volume overloaded but has been hypotensive despite IVF's.  Will attempt UF as tolerated. 5.  Anemia  -  Recommend blood transfusion with HD today to see if that will help support his BP. 6.  Metabolic bone disease -  Hold binders as he is npo due to AMS 7.  Nutrition -  Npo for now 8. Disposition- overall prognosis remains poor.  Pt with severe protein malnutrition, unstageable sacral decubitus ulcer, ESRD, PAD s/p bilateral amputations, chronic hypotension, and failure to thrive.  Recommend palliative care consult to help transition to comfort measures.   Donetta Potts, MD [place] Pager [phone] 09/28/2019, 8:51 AM

## 2019-09-28 DEATH — deceased

## 2019-09-29 LAB — CBC
HCT: 36 % — ABNORMAL LOW (ref 39.0–52.0)
Hemoglobin: 11.5 g/dL — ABNORMAL LOW (ref 13.0–17.0)
MCH: 23 pg — ABNORMAL LOW (ref 26.0–34.0)
MCHC: 31.9 g/dL (ref 30.0–36.0)
MCV: 72 fL — ABNORMAL LOW (ref 80.0–100.0)
Platelets: 245 10*3/uL (ref 150–400)
RBC: 5 MIL/uL (ref 4.22–5.81)
RDW: 27.7 % — ABNORMAL HIGH (ref 11.5–15.5)
WBC: 41.5 10*3/uL — ABNORMAL HIGH (ref 4.0–10.5)
nRBC: 0 % (ref 0.0–0.2)

## 2019-09-29 LAB — BPAM RBC
Blood Product Expiration Date: 202106292359
Blood Product Expiration Date: 202106292359
ISSUE DATE / TIME: 202106011538
ISSUE DATE / TIME: 202106011806
Unit Type and Rh: 6200
Unit Type and Rh: 6200

## 2019-09-29 LAB — BASIC METABOLIC PANEL
Anion gap: 15 (ref 5–15)
BUN: 24 mg/dL — ABNORMAL HIGH (ref 8–23)
CO2: 23 mmol/L (ref 22–32)
Calcium: 9.1 mg/dL (ref 8.9–10.3)
Chloride: 100 mmol/L (ref 98–111)
Creatinine, Ser: 2.62 mg/dL — ABNORMAL HIGH (ref 0.61–1.24)
GFR calc Af Amer: 26 mL/min — ABNORMAL LOW (ref >60)
GFR calc non Af Amer: 23 mL/min — ABNORMAL LOW (ref >60)
Glucose, Bld: 85 mg/dL (ref 70–99)
Potassium: 3.4 mmol/L — ABNORMAL LOW (ref 3.5–5.1)
Sodium: 138 mmol/L (ref 135–145)

## 2019-09-29 LAB — TYPE AND SCREEN
ABO/RH(D): A POS
Antibody Screen: NEGATIVE
Unit division: 0
Unit division: 0

## 2019-09-29 MED ORDER — POTASSIUM CHLORIDE CRYS ER 20 MEQ PO TBCR: 40.0000 meq | EXTENDED_RELEASE_TABLET | ORAL | Status: DC

## 2019-09-29 MED ORDER — POTASSIUM CHLORIDE CRYS ER 20 MEQ PO TBCR: 40.0000 meq | EXTENDED_RELEASE_TABLET | Freq: Once | ORAL | Status: DC

## 2019-09-29 NOTE — Progress Notes (Signed)
Palliative:  HPI: 79 y.o. male  with past medical history of ESRD on HD MWF, PVD, s/p left AKA, s/p right transmetatarsal amputation, HFpEF, chronic hypotension on midodrine, overall failure to thrive, sacral decubitus ulcer admitted on 09/09/2019 with shortness of breath at dialysis center and found to have bilateral pleural effusions and left lower lobe pneumonia sepsis. Poor candidate for CRRT due to overall poor prognosis with failure to thrive. Attempt for iHD today but did not tolerate yesterday. Palliative consulted to assist with goals of care conversation as he is unlikely to tolerate further dialysis and consideration of comfort care would be appropriate.    I met today with son, [name], and chaplain [name] at bedside. We were joined by daughter, [name]. [name] is especially struggling with his father's decline and accepting that he is at end of life. He certainly understands that his father is critically ill but continues to hold out hope that he will turn around. We review concerns that he cannot tolerate dialysis, he is not eating/drinking, and he has terminal wounds that are barriers to improvement. I acknowledged their hope for a miracle and certainly hope that they get a miracle but I wanted to be clear that medically there is not anything we can do to help him to improve at this stage. Chaplain was very helpful in supporting family through this difficult conversation. Jr stepped out after our conversation and I verified with [name] that we will continue current interventions but also ensure that he is comfortable and not suffering first and foremost with the goal to allow time for [name] to process and come to terms with his father's poor prognosis. [name] agrees this is most appropriate plan for her family at current time.   All questions/concerns addressed. Emotional support provided. Updated RN, CSW, and Dr. Denton Brick.   Exam: Mostly unresponsive but with occasional awakening with grunt.  Cachectic, frail. HR normal rate but in and out of bigeminy. Breathing regular, unlabored. Abd flat. Generalized weakness but sometimes restless. L AKA. R transmetatarsal amputation with dressing in place. Stage IV sacral ulcer not assessed.   Plan: - Comfort is ultimate priority.  - Not tolerating HD and no plans for escalation of care but continue current therapies.  - Son struggling with poor prognosis and need to give him time and space to process his father's poor prognosis.  - Ongoing palliative and spiritual support will be provided.   [place], NP Palliative Medicine Team Pager [phone] (Please see [email or web address] for schedule) Team Phone [phone]    Greater than 50%  of this time was spent counseling and coordinating care related to the above assessment and plan

## 2019-09-29 NOTE — Progress Notes (Signed)
Patient ID: [name]., male   DOB: 12-Jul-1940, 79 y.o.   MRN: [id number] S: Unable to tolerate HD yesterday and remains hypotensive.  Focus changed to comfort. O:BP (!) 77/47   Pulse 93   Temp 98 F (36.7 C) (Axillary)   Resp 15   Ht 5\' 5"  (1.651 m)   Wt 52.7 kg   SpO2 (!) 87%   BMI 19.33 kg/m   Intake/Output Summary (Last 24 hours) at 09/29/2019 1121 Last data filed at 09/29/2019 0033 Gross per 24 hour  Intake 954 ml  Output -548 ml  Net 1502 ml   Intake/Output: I/O last 3 completed shifts: In: 1019.7 [Blood:854; IV Piggyback:165.7] Out: -548   Intake/Output this shift:  No intake/output data recorded. Weight change: 0.536 kg Gen: unresponsive Lungs: scattered rhonchi Ext 2+ edema of RUE, 1+ edema LUE s/p LAKA, sp right transmet amputation, clotted LUE AVF  Recent Labs  Lab 09/23/19 0639 09/06/2019 1129 09/25/2019 1144 09/28/19 0450 09/29/19 0451  NA 135 138 137 138 138  K 5.1 3.2* 3.2* 3.5 3.4*  CL 100 99 101 100 100  CO2 24 25  --  26 23  GLUCOSE 93 97 85 104* 85  BUN 15 26* 23 34* 24*  CREATININE 2.09* 2.76* 2.80* 3.26* 2.62*  ALBUMIN 2.2* 2.1*  --  1.7*  --   CALCIUM 7.9* 8.5*  --  8.6* 9.1  PHOS 1.9*  --   --   --   --   AST  --  26  --  18  --   ALT  --  6  --  7  --    Liver Function Tests: Recent Labs  Lab 09/23/19 0639 09/10/2019 1129 09/28/19 0450  AST  --  26 18  ALT  --  6 7  ALKPHOS  --  70 58  BILITOT  --  1.6* 1.1  PROT  --  6.7 5.8*  ALBUMIN 2.2* 2.1* 1.7*   No results for input(s): LIPASE, AMYLASE in the last 168 hours. No results for input(s): AMMONIA in the last 168 hours. CBC: Recent Labs  Lab 09/23/19 0508 09/23/19 0508 09/25/2019 1129 09/02/2019 1144 09/28/19 0450 09/28/19 2308 09/29/19 0616  WBC 35.1*   < > 40.3*  --  42.0*  --  41.5*  HGB 9.9*   < > 8.6*   < > 7.9* 10.8* 11.5*  HCT 32.1*   < > 27.9*   < > 24.9* 33.8* 36.0*  MCV 68.9*  --  66.9*  --  65.5*  --  72.0*  PLT PLATELET CLUMPS NOTED ON SMEAR, COUNT APPEARS  ADEQUATE   < > 353  --  293  --  245   < > = values in this interval not displayed.   Cardiac Enzymes: No results for input(s): CKTOTAL, CKMB, CKMBINDEX, TROPONINI in the last 168 hours. CBG: Recent Labs  Lab 09/28/19 0631 09/28/19 0741 09/28/19 1143  GLUCAP 76 95 107*    Iron Studies: No results for input(s): IRON, TIBC, TRANSFERRIN, FERRITIN in the last 72 hours. Studies/Results: CT Angio Chest PE W and/or Wo Contrast  Result Date: 09/04/2019 CLINICAL DATA:  Acute onset shortness of breath today. Recently underwent dialysis. Clinical suspicion for pulmonary embolism. EXAM: CT ANGIOGRAPHY CHEST WITH CONTRAST TECHNIQUE: Multidetector CT imaging of the chest was performed using the standard protocol during bolus administration of intravenous contrast. Multiplanar CT image reconstructions and MIPs were obtained to evaluate the vascular anatomy. CONTRAST:  174mL OMNIPAQUE IOHEXOL 350  MG/ML SOLN COMPARISON:  08/02/2018 FINDINGS: Cardiovascular: Satisfactory opacification of pulmonary arteries noted. Left lower lobe atelectasis limits evaluation the peripheral left lower lobe pulmonary arteries, however no pulmonary emboli identified. No evidence of thoracic aortic dissection or aneurysm. Aortic and coronary artery atherosclerosis noted. Mediastinum/Nodes: No masses or pathologically enlarged lymph nodes identified. Lungs/Pleura: Moderate left and small right pleural effusions are seen. Left lower lobe compressive atelectasis is demonstrated. Mild patchy airspace disease is seen in the superior left lower lobe and posterior left lower lobe, suspicious for pneumonia. An 8 mm pulmonary nodule in the medial left upper lobe on image 47/6 remains stable, consistent with benign etiology. Upper abdomen: No acute findings. Numerous small cysts are again seen in the visualized portions of the kidneys, consistent with cystic disease of dialysis. Musculoskeletal: No suspicious bone lesions identified. Lucency  involving the T10 vertebra remains stable. Review of the MIP images confirms the above findings. IMPRESSION: 1. No evidence of pulmonary embolism. 2. Moderate left and small right pleural effusions. 3. Left lower lobe compressive atelectasis. 4. Mild patchy airspace disease in superior left lower lobe and posterior left lower lobe, suspicious for pneumonia. Aortic Atherosclerosis (ICD10-I70.0). Electronically Signed   By: Marlaine Hind M.D.   On: 09/23/2019 15:09   . Chlorhexidine Gluconate Cloth  6 each Topical Daily  . Chlorhexidine Gluconate Cloth  6 each Topical Q0600  . mupirocin ointment   Nasal BID  . potassium chloride  40 mEq Oral Once  . sodium hypochlorite   Topical BID  . [START ON 10/01/2019] sodium hypochlorite   Topical BID  . vancomycin  500 mg Intravenous Q M,W,F-HD    BMET    Component Value Date/Time   NA 138 09/29/2019 0451   K 3.4 (L) 09/29/2019 0451   CL 100 09/29/2019 0451   CO2 23 09/29/2019 0451   GLUCOSE 85 09/29/2019 0451   BUN 24 (H) 09/29/2019 0451   CREATININE 2.62 (H) 09/29/2019 0451   CREATININE 10.80 (H) 07/06/2015 1158   CALCIUM 9.1 09/29/2019 0451   GFRNONAA 23 (L) 09/29/2019 0451   GFRAA 26 (L) 09/29/2019 0451   CBC    Component Value Date/Time   WBC 41.5 (H) 09/29/2019 0616   RBC 5.00 09/29/2019 0616   HGB 11.5 (L) 09/29/2019 0616   HCT 36.0 (L) 09/29/2019 0616   PLT 245 09/29/2019 0616   MCV 72.0 (L) 09/29/2019 0616   MCH 23.0 (L) 09/29/2019 0616   MCHC 31.9 09/29/2019 0616   RDW 27.7 (H) 09/29/2019 0616   LYMPHSABS 3.4 09/15/2019 0424   MONOABS 1.3 (H) 09/15/2019 0424   EOSABS 0.6 (H) 09/15/2019 0424   BASOSABS 0.1 09/15/2019 0424    Dialysis Orders: Center: [facility]  on MWF . EDW 53 kg HD Bath 2K/2.5Ca  Time 4 hours Heparin  500 bolus, 2,000/hr. Access RIJ TDC (clotted LUE AVF) BFR 400 DFR 800   Temp 36 Sensipar 60 mg po with HD Epogen 4,000   Units IV/HD  Venofer  50 mg IV qweek  Midodrine prior to  HD  Assessment/Plan: 1.  Severe sepsis syndrome presumably due to HCAP- unable to get central line access for pressors. Started on vanc and cefepime per primary but has not improved.  Agree with transition to comfort care. 2.  ESRD -  Too unstable and unable to tolerate HD 09/28/19 due to hypotension would recommend palliative care consult to transition to comfort care.  I don't think transfer to [facility] for CRRT would significantly impact his overall  outcome given his ongoing issues with hypotension, sacral decubitus ulcer, PAD, and failure to thrive.  No further dialysis.   3. Acute hypoxic respiratory failure- due to HCAP.  Pt is DNR.  Was on 100% non-rebreather with improvement but now hypoxic at 85%.  Continue with supportive care  4. Hypertension/volume  - volume overloaded but has been hypotensive despite IVF's.  Will attempt UF as tolerated. 5.  Anemia  -  Recommend blood transfusion with HD today to see if that will help support his BP. 6.  Metabolic bone disease -  Hold binders as he is npo due to AMS 7.  Nutrition -  Npo for now 8. Disposition- overall prognosis remains poor.  Pt with severe protein malnutrition, unstageable sacral decubitus ulcer, ESRD, PAD s/p bilateral amputations, chronic hypotension, and failure to thrive.  Appreciate palliative care assistance in transition to comfort measures.  Nothing further to add, will sign off.  Please call with questions or concerns.   Donetta Potts, MD [employer] [phone]

## 2019-09-29 NOTE — Progress Notes (Signed)
Present with [name], while he rested and his son, Race, Latour and his daughter, [name]. Vinie Sill, NP/Palliative Care was also present. Offered emotional and spiritual support.

## 2019-09-29 NOTE — Progress Notes (Signed)
PROGRESS NOTE    [name]  SJG:[id number] DOB: Aug 13, 1940 DOA: 09/04/2019 PCP: [name], MD    Brief Narrative:  Joseph Jaime. is a 79 y.o. 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  He was discharged to skilled nursing facility.  Patient had presented to the dialysis center today for routine dialysis when he reported that he was short of breath.  History is somewhat limited since patient cannot participate.  History is obtained from his daughter.  At the dialysis center, supplemental oxygen was applied, but he continued to be short of breath.  He was sent to the ER for evaluation.  Upon arrival to the emergency room, he was noted to be hypotensive, hypoxic.  Accurate vital signs were proved to be difficult since pulse ox did not have accurate waveform and blood pressure cuff had to be placed on his leg.  Initial blood gas showed pH of 7.46, PCO2 of 33, PO2 of 248 on 100% oxygen.  CBC showed WBC count of 40.3, up from 35.1, from 4 days earlier.  Potassium 3.2, BUN of 26, lactic acid of 3.4.  CT angiogram of the chest was negative for pulmonary embolism, but did show bilateral pleural effusions and left-sided pneumonia.  Patient was bolused with IV fluids per sepsis protocol.  He received intravenous antibiotics.  He was referred for admission. --- Overall prognosis is poor, not tolerating hemodialysis, anticipate transition to full comfort care  Assessment & Plan:   Active Problems:   ESRD on dialysis [facility])   PVD (peripheral vascular disease) (HCC)   S/P transmetatarsal amputation of foot, right (HCC)   Leukocytosis   Acute metabolic encephalopathy   Chronic  hypotension   Severe sepsis with septic shock (HCC)   DNR (do not resuscitate)   Acute respiratory failure with hypoxia ([place])   HCAP (healthcare-associated pneumonia)   Chronic diastolic CHF (congestive heart failure) (HCC)   Bilateral pleural effusion   S/P AKA (above knee amputation), left ([place])   Anemia due to chronic kidney disease   Decubitus ulcer of sacral region, stage 4 ([place])   E. coli sepsis ([place])   1. Severe sepsis with septic shock.  Source is likely pneumonia/sacral decub.  He has been started on intravenous antibiotics.  Blood cultures positive for E coli.  Lactic acid elevated on admission but has trended down with IV fluids. Cortisol is appropriately elevated,  discontinued stress dose steroids ready-------- Overall prognosis is poor, not tolerating hemodialysis, anticipate transition to full comfort care 2. Acute respiratory failure with hypoxia.  Related to pneumonia and pleural effusions. Initially on 100% NRB, -continue submental oxygen 3. Leukocytosis.  Marked leukocytosis, present since prior admission.  Worsening of WBC count related to sepsis. He is not having any diarrhea.    4. Healthcare associated pneumonia.  Currently on vancomycin and meropenem----- Overall prognosis is poor, not tolerating hemodialysis, anticipate transition to full comfort care   5. End-stage renal disease on hemodialysis.  Nephrology following. HD   challenging in light of on going issues with hypotension.   6)Peripheral vascular disease.  S/p left above-the-knee amputation.    Unable to resume on antiplatelet agents due to lethargy and inability to  take oral intake    7)Acute metabolic encephalopathy.  Likely related to  sepsis/hypotension. He remains somnolent/unresponsive.  Continue to monitor.   8)Chronic hypotension.  He is chronically on midodrine.  Resume once mental status has improved.  9)Large sacral decubitus ulcer, unstageable, present on admission.  Wound care input  appreciated. If patient stabilizes, can consider MRI of sacrum to evaluate for osteomyelitis, although I suspect this will be present considering the degree of the wound.  10)Goals of care.  Palliative care consulted to help assist in goals of care discussions. Overall prognosis is poor.  Palliative care consult appreciated ---- Overall prognosis is poor, not tolerating hemodialysis, anticipate transition to full comfort care.   DVT prophylaxis: heparin Code Status: DNR Family Communication: Palliative care services had meeting with family again on 09/29/2019   disposition Plan: Status is: Inpatient--- awaiting family's decision on possible transition to comfort care and hospice  Remains inpatient appropriate because:Inpatient level of care appropriate due to severity of illness   Dispo: The patient is from: SNF              Anticipated d/c is to: SNF              Anticipated d/c date is: > 3 days              Patient currently is not medically stable to d/c. -awaiting family's decision on possible transition to comfort care and hospice  Consultants:   Palliative care  Nephrology  Wound care  Procedures:     Antimicrobials:   Vancomycin  Meropenem   Subjective: Patient is somnolent, does not follow commands or answer questions -awaiting family's decision on possible transition to comfort care and hospice   Objective: Vitals:   09/29/19 0826 09/29/19 1055 09/29/19 1458 09/29/19 1500  BP:  (!) 77/47 101/87 (!) 79/59  Pulse:  93    Resp:  15 17 16   Temp:      TempSrc:      SpO2: 95% (!) 87%    Weight:      Height:        Intake/Output Summary (Last 24 hours) at 09/29/2019 1846 Last data filed at 09/29/2019 0033 Gross per 24 hour  Intake 324 ml  Output --  Net 324 ml   Filed Weights   09/05/2019 1719 09/28/19 0628 09/28/19 1315  Weight: 51.9 kg 52.6 kg 52.7 kg    Examination:  General exam: somnolent, does not respond to voice Respiratory system: diminished  breath sounds bilaterally. Respiratory effort normal. Cardiovascular system: S1 & S2 heard, RRR. No JVD, murmurs, rubs, gallops or clicks.  Gastrointestinal system: Abdomen is nondistended, soft and nontender. No organomegaly or masses felt. Normal bowel sounds heard. Central nervous system: limited exam due to mental status Extremities: LLE AKA, RLE TMA Skin: stage 4 sacral decubitus ulcer. Incision site of AKA with staples looks clean Psychiatry: somnolent    Data Reviewed:  CBC: Recent Labs  Lab 09/23/19 0508 09/23/19 0508 09/01/2019 1129 09/13/2019 1144 09/28/19 0450 09/28/19 2308 09/29/19 0616  WBC 35.1*  --  40.3*  --  42.0*  --  41.5*  HGB 9.9*   < > 8.6* 11.2* 7.9* 10.8* 11.5*  HCT 32.1*   < > 27.9* 33.0* 24.9* 33.8* 36.0*  MCV 68.9*  --  66.9*  --  65.5*  --  72.0*  PLT PLATELET CLUMPS NOTED ON SMEAR, COUNT APPEARS ADEQUATE  --  353  --  293  --  245   < > = values in this interval not displayed.   Basic Metabolic Panel:  Recent Labs  Lab 09/23/19 0639 09/02/2019 1129 09/16/2019 1144 09/28/19 0450 09/29/19 0451  NA 135 138 137 138 138  K 5.1 3.2* 3.2* 3.5 3.4*  CL 100 99 101 100 100  CO2 24 25  --  26 23  GLUCOSE 93 97 85 104* 85  BUN 15 26* 23 34* 24*  CREATININE 2.09* 2.76* 2.80* 3.26* 2.62*  CALCIUM 7.9* 8.5*  --  8.6* 9.1  MG 2.2  --   --   --   --   PHOS 1.9*  --   --   --   --    GFR: Estimated Creatinine Clearance: 17.9 mL/min (A) (by C-G formula based on SCr of 2.62 mg/dL (H)). Liver Function Tests: Recent Labs  Lab 09/23/19 0639 09/26/2019 1129 09/28/19 0450  AST  --  26 18  ALT  --  6 7  ALKPHOS  --  70 58  BILITOT  --  1.6* 1.1  PROT  --  6.7 5.8*  ALBUMIN 2.2* 2.1* 1.7*   No results for input(s): LIPASE, AMYLASE in the last 168 hours. No results for input(s): AMMONIA in the last 168 hours. Coagulation Profile: Recent Labs  Lab 09/01/2019 1129  INR 1.4*   Cardiac Enzymes: No results for input(s): CKTOTAL, CKMB, CKMBINDEX, TROPONINI in the  last 168 hours. BNP (last 3 results) No results for input(s): PROBNP in the last 8760 hours. HbA1C: No results for input(s): HGBA1C in the last 72 hours. CBG: Recent Labs  Lab 09/28/19 0631 09/28/19 0741 09/28/19 1143  GLUCAP 76 95 107*   Lipid Profile: No results for input(s): CHOL, HDL, LDLCALC, TRIG, CHOLHDL, LDLDIRECT in the last 72 hours. Thyroid Function Tests: No results for input(s): TSH, T4TOTAL, FREET4, T3FREE, THYROIDAB in the last 72 hours. Anemia Panel: No results for input(s): VITAMINB12, FOLATE, FERRITIN, TIBC, IRON, RETICCTPCT in the last 72 hours. Sepsis Labs: Recent Labs  Lab 08/28/2019 1129 09/23/2019 1729 09/10/2019 2041  PROCALCITON  --   --  7.72  LATICACIDVEN 3.4* 1.5 0.9    Recent Results (from the past 240 hour(s))  Blood culture (routine x 2)     Status: Abnormal (Preliminary result)   Collection Time: 08/30/2019 11:29 AM   Specimen: Right Antecubital; Blood  Result Value Ref Range Status   Specimen Description   Final    RIGHT ANTECUBITAL Performed at [facility], [address] [place], [place] [address]    Special Requests   Final    BOTTLES DRAWN AEROBIC ONLY Blood Culture adequate volume Performed at [facility], [address], [place], [place] [address]    Culture  Setup Time   Final    AEROBIC BOTTLE ONLY GRAM NEGATIVE RODS Gram Stain Report Called to,Read Back By and Verified With: H TETREAULT,RN @0335  09/28/19 MKELLY Organism ID to follow Performed at [facility], [place] [address], [place], [place] [address]    Culture ESCHERICHIA COLI (A)  Final   Report Status PENDING  Incomplete  Blood culture (routine x 2)     Status: None (Preliminary result)   Collection Time: 09/14/2019 11:29 AM   Specimen: Right Antecubital; Blood  Result Value Ref Range Status   Specimen Description   Final    RIGHT ANTECUBITAL Performed at [facility], [address], [place], [place] [address]    Special Requests   Final    BOTTLES DRAWN AEROBIC  ONLY Blood Culture adequate volume Performed at [facility], [address], [place], [place] [address]  Culture  Setup Time   Final    AEROBIC BOTTLE ONLY GRAM NEGATIVE RODS Gram Stain Report Called to,Read Back By and Verified With: J HEARN,RN @0112  09/29/19 [facility] Performed at [facility], [place] [address] [place], [place] [address]    Culture   Final    NO GROWTH 2 DAYS Performed at [facility], [address], [place], [place] [address]    Report Status PENDING  Incomplete  Blood Culture ID Panel (Reflexed)     Status: Abnormal   Collection Time: 09/05/2019 11:29 AM  Result Value Ref Range Status   Enterococcus species NOT DETECTED NOT DETECTED Final   Listeria monocytogenes NOT DETECTED NOT DETECTED Final   Staphylococcus species NOT DETECTED NOT DETECTED Final   Staphylococcus aureus (BCID) NOT DETECTED NOT DETECTED Final   Streptococcus species NOT DETECTED NOT DETECTED Final   Streptococcus agalactiae NOT DETECTED NOT DETECTED Final   Streptococcus pneumoniae NOT DETECTED NOT DETECTED Final   Streptococcus pyogenes NOT DETECTED NOT DETECTED Final   Acinetobacter baumannii NOT DETECTED NOT DETECTED Final   Enterobacteriaceae species DETECTED (A) NOT DETECTED Final    Comment: Enterobacteriaceae represent a large family of gram-negative bacteria, not a single organism. CRITICAL RESULT CALLED TO, READ BACK BY AND VERIFIED WITH: Gloris [place] PharmD 8:45 09/28/19 (wilsonm)    Enterobacter cloacae complex NOT DETECTED NOT DETECTED Final   Escherichia coli DETECTED (A) NOT DETECTED Final    Comment: CRITICAL RESULT CALLED TO, READ BACK BY AND VERIFIED WITH: Gloris [place] PharmD 8:45 09/28/19 (wilsonm)    Klebsiella oxytoca NOT DETECTED NOT DETECTED Final   Klebsiella pneumoniae NOT DETECTED NOT DETECTED Final   Proteus species NOT DETECTED NOT DETECTED Final   Serratia marcescens NOT DETECTED NOT DETECTED Final   Carbapenem resistance NOT DETECTED NOT DETECTED Final   Haemophilus  influenzae NOT DETECTED NOT DETECTED Final   Neisseria meningitidis NOT DETECTED NOT DETECTED Final   Pseudomonas aeruginosa NOT DETECTED NOT DETECTED Final   Candida albicans NOT DETECTED NOT DETECTED Final   Candida glabrata NOT DETECTED NOT DETECTED Final   Candida krusei NOT DETECTED NOT DETECTED Final   Candida parapsilosis NOT DETECTED NOT DETECTED Final   Candida tropicalis NOT DETECTED NOT DETECTED Final    Comment: Performed at [facility], [place] [address], [place], [place] [address]  SARS Coronavirus 2 by RT PCR (hospital order, performed in [facility] hospital lab) Nasopharyngeal Nasopharyngeal Swab     Status: None   Collection Time: 09/10/2019 12:29 PM   Specimen: Nasopharyngeal Swab  Result Value Ref Range Status   SARS Coronavirus 2 NEGATIVE NEGATIVE Final    Comment: (NOTE) SARS-CoV-2 target nucleic acids are NOT DETECTED. The SARS-CoV-2 RNA is generally detectable in upper and lower respiratory specimens during the acute phase of infection. The lowest concentration of SARS-CoV-2 viral copies this assay can detect is 250 copies / mL. A negative result does not preclude SARS-CoV-2 infection and should not be used as the sole basis for treatment or other patient management decisions.  A negative result may occur with improper specimen collection / handling, submission of specimen other than nasopharyngeal swab, presence of viral mutation(s) within the areas targeted by this assay, and inadequate number of viral copies (<250 copies / mL). A negative result must be combined with clinical observations, patient history, and epidemiological information. Fact Sheet for Patients:   StrictlyIdeas.no Fact Sheet for Healthcare Providers: BankingDealers.co.za This test is not yet approved or cleared  by the [place]  States FDA and has been authorized for detection and/or diagnosis of SARS-CoV-2 by FDA under an Emergency Use  Authorization (EUA).  This EUA will remain in effect (meaning this test can be used) for the duration of the COVID-19 declaration under Section 564(b)(1) of the Act, 21 U.S.C. section 360bbb-3(b)(1), unless the authorization is terminated or revoked sooner. Performed at [facility], [address], [place], [place] [address]   MRSA PCR Screening     Status: Abnormal   Collection Time: 09/26/2019  6:32 PM   Specimen: Nasal Mucosa; Nasopharyngeal  Result Value Ref Range Status   MRSA by PCR POSITIVE (A) NEGATIVE Final    Comment:        The GeneXpert MRSA Assay (FDA approved for NASAL specimens only), is one component of a comprehensive MRSA colonization surveillance program. It is not intended to diagnose MRSA infection nor to guide or monitor treatment for MRSA infections. RESULT CALLED TO, READ BACK BY AND VERIFIED WITH: HEARN,RN AT 2155 ON 5.31.21 BY ISLEY,B Performed at [facility], [address], [place], [place] [address]          Radiology Studies: No results found.      Scheduled Meds: . Chlorhexidine Gluconate Cloth  6 each Topical Daily  . Chlorhexidine Gluconate Cloth  6 each Topical Q0600  . mupirocin ointment   Nasal BID  . potassium chloride  40 mEq Oral Once  . sodium hypochlorite   Topical BID  . [START ON 10/01/2019] sodium hypochlorite   Topical BID  . vancomycin  500 mg Intravenous Q M,W,F-HD   Continuous Infusions: . meropenem (MERREM) IV Stopped (09/28/19 2201)     LOS: 2 days    Time spent: 35 mins     Roxan Hockey, MD [facility]   If 7PM-7AM, please contact night-coverage [email or web address]  09/29/2019, 6:46 PM

## 2019-09-30 LAB — CULTURE, BLOOD (ROUTINE X 2): Special Requests: ADEQUATE

## 2019-09-30 LAB — CBC
HCT: 37.5 % — ABNORMAL LOW (ref 39.0–52.0)
Hemoglobin: 11.7 g/dL — ABNORMAL LOW (ref 13.0–17.0)
MCH: 22.5 pg — ABNORMAL LOW (ref 26.0–34.0)
MCHC: 31.2 g/dL (ref 30.0–36.0)
MCV: 72.1 fL — ABNORMAL LOW (ref 80.0–100.0)
Platelets: 224 10*3/uL (ref 150–400)
RBC: 5.2 MIL/uL (ref 4.22–5.81)
RDW: 28.5 % — ABNORMAL HIGH (ref 11.5–15.5)
WBC: 36.4 10*3/uL — ABNORMAL HIGH (ref 4.0–10.5)
nRBC: 0 % (ref 0.0–0.2)

## 2019-09-30 LAB — BASIC METABOLIC PANEL
Anion gap: 16 — ABNORMAL HIGH (ref 5–15)
BUN: 35 mg/dL — ABNORMAL HIGH (ref 8–23)
CO2: 23 mmol/L (ref 22–32)
Calcium: 8.8 mg/dL — ABNORMAL LOW (ref 8.9–10.3)
Chloride: 100 mmol/L (ref 98–111)
Creatinine, Ser: 3.55 mg/dL — ABNORMAL HIGH (ref 0.61–1.24)
GFR calc Af Amer: 18 mL/min — ABNORMAL LOW (ref >60)
GFR calc non Af Amer: 16 mL/min — ABNORMAL LOW (ref >60)
Glucose, Bld: 76 mg/dL (ref 70–99)
Potassium: 3.2 mmol/L — ABNORMAL LOW (ref 3.5–5.1)
Sodium: 139 mmol/L (ref 135–145)

## 2019-09-30 MED ORDER — ORAL CARE MOUTH RINSE
15.0000 mL | OROMUCOSAL | Status: DC
  Administered 2019-09-30 – 2019-10-04 (×21): 15 mL via OROMUCOSAL

## 2019-09-30 NOTE — Progress Notes (Signed)
Palliative:  HPI: 79 y.o.malewith past medical history of ESRD on HD MWF, PVD, s/p left AKA, s/p right transmetatarsal amputation, HFpEF, chronic hypotension on midodrine, overall failure to thrive, sacral decubitus ulceradmitted on 5/31/2021with shortness of breath at dialysis center and found to have bilateral pleural effusions and leftlower lobepneumonia sepsis.Poor candidate for CRRT due to overall poor prognosis with failure to thrive. Attempt for iHD today but did not tolerate yesterday. Palliative consulted to assist with goals of care conversation as he is unlikely to tolerate further dialysis and consideration of comfort care would be appropriate.    I met today with [name]. He is more awake and alert today but still lethargic. He is able to answer questions yes/no to simple questions. He seems to have understanding of what I am telling him. I did explain that his body is no longer able to tolerate dialysis and he says "okay" and nods head yes. I explained that our goal for him is to keep him comfortable and he nods his head yes. I asked him if he is scared and he nods his head no. He nods head no to pain or discomfort. He does tell me he is cold and I assisted with covering him more with blankets and he reports this is better. He also requests that I reposition his right leg which I did. I asked if there was anything else I could do for him currently and he nods his head no. When asked if he is now comfortable he nods his head yes.   I met later with daughter, [name]. [name] is more sleepy but will still nod head but less consistent than he did this morning. I explained the above interaction to [name] and explained that [name] seemed at peace and did not seem bothered or worried about what I told him. He seems at peace. [name] seemed to find comfort that her father seems to understand situation and seems at peace. We discussed again goal for comfort and that we will no longer stick him  for blood work as this is only causing him discomfort and his arms are more edematous and would be difficult to obtain blood work. However, with no further dialysis prognosis is poor and likely days regardless of any other interventions provided or not. I did explain to [name] that [name] would be a good candidate to go to hospice facility for comfort care. We both fear that [name] is not ready to accept this and this is not a good option currently. Goal remains comfort.   All questions/concerns addressed. Emotional support provided. Discussed with Dr. Denton Brick who was able to speak with son [name] today.   Exam: More alert and answering simple questions appropriately. Minimal verbal response and inconsistent. No distress. Breathing regular, unlabored. Abd flat. Severe muscle wasting. Generalized weakness. L stump and R foot amputation noted. Sacral wound not assessed.   Plan: - Comfort care.  - Son [name] struggling with poor prognosis and acceptance that [name] is at end of life.  - Ongoing support recommended from palliative care and spiritual care.   25 min  Vinie Sill, NP Palliative Medicine Team Pager [phone] (Please see [email or web address] for schedule) Team Phone [phone]    Greater than 50%  of this time was spent counseling and coordinating care related to the above assessment and plan

## 2019-09-30 NOTE — Progress Notes (Signed)
PROGRESS NOTE    Terek Bee  WYO:[id number] DOB: 12-02-1940 DOA: 09/14/2019 PCP: [name], MD    Brief Narrative:  Mohsen Odenthal. is a 79 y.o. 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  He was discharged to skilled nursing facility.  Patient had presented to the dialysis center today for routine dialysis when he reported that he was short of breath.  History is somewhat limited since patient cannot participate.  History is obtained from his daughter.  At the dialysis center, supplemental oxygen was applied, but he continued to be short of breath.  He was sent to the ER for evaluation.  Upon arrival to the emergency room, he was noted to be hypotensive, hypoxic.  Accurate vital signs were proved to be difficult since pulse ox did not have accurate waveform and blood pressure cuff had to be placed on his leg.  Initial blood gas showed pH of 7.46, PCO2 of 33, PO2 of 248 on 100% oxygen.  CBC showed WBC count of 40.3, up from 35.1, from 4 days earlier.  Potassium 3.2, BUN of 26, lactic acid of 3.4.  CT angiogram of the chest was negative for pulmonary embolism, but did show bilateral pleural effusions and left-sided pneumonia.  Patient was bolused with IV fluids per sepsis protocol.  He received intravenous antibiotics.  He was referred for admission. --- Overall prognosis is poor, not tolerating hemodialysis, anticipate transition to full comfort care  Assessment & Plan:   Active Problems:   ESRD on dialysis [facility])   PVD (peripheral vascular disease) (HCC)   S/P transmetatarsal amputation of foot, right (HCC)   Leukocytosis   Acute metabolic encephalopathy   Chronic  hypotension   Severe sepsis with septic shock (HCC)   DNR (do not resuscitate)   Acute respiratory failure with hypoxia ([place])   HCAP (healthcare-associated pneumonia)   Chronic diastolic CHF (congestive heart failure) (HCC)   Bilateral pleural effusion   S/P AKA (above knee amputation), left ([place])   Anemia due to chronic kidney disease   Decubitus ulcer of sacral region, stage 4 ([place])   E. coli sepsis ([place])   1. Severe sepsis with septic shock.  Source is likely pneumonia/sacral decub.  He has been started on intravenous antibiotics.  Blood cultures positive for E coli.  Lactic acid elevated on admission but has trended down with IV fluids. Cortisol is appropriately elevated,  discontinued stress dose steroids ready-------- Overall prognosis is poor, not tolerating hemodialysis, anticipate transition to full comfort care 2. Acute respiratory failure with hypoxia.  Related to pneumonia and pleural effusions. Initially on 100% NRB, -continue submental oxygen 3. Leukocytosis.  Marked leukocytosis, present since prior admission.  Worsening of WBC count related to sepsis. He is not having any diarrhea.    4. Healthcare associated pneumonia.  Currently on vancomycin and meropenem----- Overall prognosis is poor, not tolerating hemodialysis, anticipate transition to full comfort care   5. End-stage renal disease on hemodialysis.  Nephrology following. HD   challenging in light of on going issues with hypotension.   6)Peripheral vascular disease.  S/p left above-the-knee amputation.    Unable to resume on antiplatelet agents due to lethargy and inability to  take oral intake    7)Acute metabolic encephalopathy.  Likely related to  sepsis/hypotension. He remains somnolent/unresponsive.  Continue to monitor.   8)Chronic hypotension.  He is chronically on midodrine.  Resume once mental status has improved.  9)Large sacral decubitus ulcer, unstageable, present on admission.  Wound care input  appreciated. If patient stabilizes, can consider MRI of sacrum to evaluate for osteomyelitis, although I suspect this will be present considering the degree of the wound.  10)Goals of care.  Palliative care consulted to help assist in goals of care discussions. Overall prognosis is poor.  Palliative care consult appreciated ---- Overall prognosis is poor, not tolerating hemodialysis, anticipate transition to full comfort care. -Further discussions with patient's son at bedside today 09/30/19 but they have not made a final decision on transition to full comfort care and hospice   DVT prophylaxis: heparin Code Status: DNR Family Communication: Palliative care services had meeting with family again on 09/29/2019   disposition Plan: Status is: Inpatient--- awaiting family's decision on possible transition to comfort care and hospice  Remains inpatient appropriate because:Inpatient level of care appropriate due to severity of illness   Dispo: The patient is from: SNF              Anticipated d/c is to: [facility]              Anticipated d/c date is: > 3 days              Patient currently is not medically stable to d/c. -awaiting family's decision on possible transition to comfort care and hospice  Consultants:   Palliative care  Nephrology  Wound care  Procedures:     Antimicrobials:   Vancomycin  Meropenem   Subjective: Patient is somnolent, able to wake up and answer yes or no questions -awaiting family's decision on possible transition to comfort care and hospice  -Discussed with patient's son who is at bedside today   Objective: Vitals:   09/29/19 1500 09/29/19 2000 09/29/19 2310 09/30/19 1221  BP: (!) 79/59   110/72  Pulse:   82 (!) 40  Resp: 16   16  Temp:  98.3 F (36.8 C) 98.3 F (36.8 C) 97.9 F (36.6 C)  TempSrc:  Axillary Axillary Oral  SpO2:    99%  Weight:      Height:       No intake or output data in the 24 hours ending 09/30/19  1916 Filed Weights   09/13/2019 1719 09/28/19 0628 09/28/19 1315  Weight: 51.9 kg 52.6 kg 52.7 kg    Examination:  General exam: somnolent, does not respond to voice Respiratory system: diminished breath sounds bilaterally. Respiratory effort normal. Cardiovascular system: S1 & S2 heard, RRR. No JVD, murmurs, rubs, gallops or clicks.  Gastrointestinal system: Abdomen is nondistended, soft and nontender. No organomegaly or masses felt. Normal bowel sounds heard. Central nervous system: limited exam due to mental status Extremities: LLE AKA, RLE TMA Skin: stage 4 sacral decubitus ulcer. Incision site of AKA with staples looks clean Psychiatry: somnolent    Data Reviewed:  CBC: Recent Labs  Lab 09/04/2019 1129 09/21/2019 1129 09/25/2019 1144 09/28/19 0450 09/28/19 2308 09/29/19 0616 09/30/19 0447  WBC 40.3*  --   --  42.0*  --  41.5* 36.4*  HGB 8.6*   < > 11.2* 7.9* 10.8* 11.5* 11.7*  HCT 27.9*   < > 33.0* 24.9* 33.8* 36.0* 37.5*  MCV 66.9*  --   --  65.5*  --  72.0* 72.1*  PLT 353  --   --  293  --  245 224   < > = values in this interval not displayed.   Basic Metabolic Panel: Recent Labs  Lab 08/28/2019 1129 09/24/2019 1144 09/28/19 0450 09/29/19 0451 09/30/19 0447  NA 138 137 138 138 139  K 3.2* 3.2* 3.5 3.4* 3.2*  CL 99 101 100 100 100  CO2 25  --  26 23 23   GLUCOSE 97 85 104* 85 76  BUN 26* 23 34* 24* 35*  CREATININE 2.76* 2.80* 3.26* 2.62* 3.55*  CALCIUM 8.5*  --  8.6* 9.1 8.8*   GFR: Estimated Creatinine Clearance: 13.2 mL/min (A) (by C-G formula based on SCr of 3.55 mg/dL (H)). Liver Function Tests: Recent Labs  Lab 09/17/2019 1129 09/28/19 0450  AST 26 18  ALT 6 7  ALKPHOS 70 58  BILITOT 1.6* 1.1  PROT 6.7 5.8*  ALBUMIN 2.1* 1.7*   No results for input(s): LIPASE, AMYLASE in the last 168 hours. No results for input(s): AMMONIA in the last 168 hours. Coagulation Profile: Recent Labs  Lab 09/06/2019 1129  INR 1.4*   Cardiac Enzymes: No results for  input(s): CKTOTAL, CKMB, CKMBINDEX, TROPONINI in the last 168 hours. BNP (last 3 results) No results for input(s): PROBNP in the last 8760 hours. HbA1C: No results for input(s): HGBA1C in the last 72 hours. CBG: Recent Labs  Lab 09/28/19 0631 09/28/19 0741 09/28/19 1143  GLUCAP 76 95 107*   Lipid Profile: No results for input(s): CHOL, HDL, LDLCALC, TRIG, CHOLHDL, LDLDIRECT in the last 72 hours. Thyroid Function Tests: No results for input(s): TSH, T4TOTAL, FREET4, T3FREE, THYROIDAB in the last 72 hours. Anemia Panel: No results for input(s): VITAMINB12, FOLATE, FERRITIN, TIBC, IRON, RETICCTPCT in the last 72 hours. Sepsis Labs: Recent Labs  Lab 09/17/2019 1129 09/12/2019 1729 09/13/2019 2041  PROCALCITON  --   --  7.72  LATICACIDVEN 3.4* 1.5 0.9    Recent Results (from the past 240 hour(s))  Blood culture (routine x 2)     Status: Abnormal   Collection Time: 09/13/2019 11:29 AM   Specimen: Right Antecubital; Blood  Result Value Ref Range Status   Specimen Description   Final    RIGHT ANTECUBITAL Performed at [facility], [address], [place], [place] [address]    Special Requests   Final    BOTTLES DRAWN AEROBIC ONLY Blood Culture adequate volume Performed at [facility], [address], [place], [place] [address]    Culture  Setup Time   Final    AEROBIC BOTTLE ONLY GRAM NEGATIVE RODS Gram Stain Report Called to,Read Back By and Verified With: H TETREAULT,RN @0335  09/28/19 MKELLY Organism ID to follow Performed at [facility], [place] [address] [place], [place] [address]    Culture ESCHERICHIA COLI (A)  Final   Report Status 09/30/2019 FINAL  Final   Organism ID, Bacteria ESCHERICHIA COLI  Final      Susceptibility   Escherichia coli - MIC*    AMPICILLIN 8 SENSITIVE Sensitive     CEFAZOLIN <=4 SENSITIVE Sensitive     CEFEPIME <=1 SENSITIVE Sensitive     CEFTAZIDIME <=1 SENSITIVE Sensitive     CEFTRIAXONE <=1 SENSITIVE Sensitive     CIPROFLOXACIN <=0.25  SENSITIVE Sensitive     GENTAMICIN <=1 SENSITIVE Sensitive     IMIPENEM <=0.25 SENSITIVE Sensitive     TRIMETH/SULFA <=20 SENSITIVE Sensitive     AMPICILLIN/SULBACTAM 4 SENSITIVE Sensitive     PIP/TAZO <=4 SENSITIVE Sensitive     * ESCHERICHIA COLI  Blood culture (routine x 2)     Status: None (Preliminary result)   Collection Time: 09/18/2019 11:29 AM   Specimen: Right Antecubital; Blood  Result Value Ref Range Status   Specimen Description   Final    RIGHT ANTECUBITAL Performed at [facility], [address] [place], [place] [address]    Special Requests   Final    BOTTLES DRAWN AEROBIC ONLY Blood Culture adequate volume Performed at [facility], [address], [place], [place] [address]    Culture  Setup Time   Final    AEROBIC BOTTLE ONLY GRAM NEGATIVE RODS Gram Stain Report Called to,Read Back By and Verified With: J HEARN,RN @0112  09/29/19 MKELLY Performed at [facility], [place] [address] [place], [place] [address]    Culture GRAM NEGATIVE RODS  Final   Report Status PENDING  Incomplete  Blood Culture ID Panel (Reflexed)     Status: Abnormal   Collection Time: 09/23/2019 11:29 AM  Result Value Ref Range Status   Enterococcus species NOT DETECTED NOT DETECTED Final   Listeria monocytogenes NOT DETECTED NOT DETECTED Final   Staphylococcus species NOT DETECTED NOT DETECTED Final   Staphylococcus aureus (BCID) NOT DETECTED NOT DETECTED Final   Streptococcus species NOT DETECTED NOT DETECTED Final   Streptococcus agalactiae NOT DETECTED NOT DETECTED Final   Streptococcus pneumoniae NOT DETECTED NOT DETECTED Final   Streptococcus pyogenes NOT DETECTED NOT DETECTED Final   Acinetobacter baumannii NOT DETECTED NOT DETECTED Final   Enterobacteriaceae species DETECTED (A) NOT DETECTED Final    Comment: Enterobacteriaceae represent a large family of gram-negative bacteria, not a single organism. CRITICAL RESULT CALLED TO, READ BACK BY AND VERIFIED WITH: Gloris Manchester PharmD 8:45  09/28/19 (wilsonm)    Enterobacter cloacae complex NOT DETECTED NOT DETECTED Final   Escherichia coli DETECTED (A) NOT DETECTED Final    Comment: CRITICAL RESULT CALLED TO, READ BACK BY AND VERIFIED WITH: Gloris Manchester PharmD 8:45 09/28/19 (wilsonm)    Klebsiella oxytoca NOT DETECTED NOT DETECTED Final   Klebsiella pneumoniae NOT DETECTED NOT DETECTED Final   Proteus species NOT DETECTED NOT DETECTED Final   Serratia marcescens NOT DETECTED NOT DETECTED Final   Carbapenem resistance NOT DETECTED NOT DETECTED Final   Haemophilus influenzae NOT DETECTED NOT DETECTED Final   Neisseria meningitidis NOT DETECTED NOT DETECTED Final   Pseudomonas aeruginosa NOT DETECTED NOT DETECTED Final   Candida albicans NOT DETECTED NOT DETECTED Final   Candida glabrata NOT DETECTED NOT DETECTED Final   Candida krusei NOT DETECTED NOT DETECTED Final   Candida parapsilosis NOT DETECTED NOT DETECTED Final   Candida tropicalis NOT DETECTED NOT DETECTED Final    Comment: Performed at [facility], [place] [address] [place], [place] [address]  SARS Coronavirus 2 by RT PCR (hospital order, performed in [facility] hospital lab) Nasopharyngeal Nasopharyngeal Swab     Status: None   Collection Time: 08/29/2019 12:29 PM   Specimen: Nasopharyngeal Swab  Result Value Ref Range Status   SARS Coronavirus 2 NEGATIVE NEGATIVE Final    Comment: (NOTE) SARS-CoV-2 target nucleic acids are NOT DETECTED. The SARS-CoV-2 RNA is generally detectable in upper and lower respiratory specimens during the acute phase of infection. The lowest concentration of SARS-CoV-2 viral copies this assay can detect is 250 copies / mL. A negative result does not preclude SARS-CoV-2 infection and should not be used as the sole basis for treatment or other patient management decisions.  A negative result may occur with improper  specimen collection / handling, submission of specimen other than nasopharyngeal swab, presence of viral mutation(s) within  the areas targeted by this assay, and inadequate number of viral copies (<250 copies / mL). A negative result must be combined with clinical observations, patient history, and epidemiological information. Fact Sheet for Patients:   StrictlyIdeas.no Fact Sheet for Healthcare Providers: BankingDealers.co.za This test is not yet approved or cleared  by the [place] FDA and has been authorized for detection and/or diagnosis of SARS-CoV-2 by FDA under an Emergency Use Authorization (EUA).  This EUA will remain in effect (meaning this test can be used) for the duration of the COVID-19 declaration under Section 564(b)(1) of the Act, 21 U.S.C. section 360bbb-3(b)(1), unless the authorization is terminated or revoked sooner. Performed at [facility], [address] [place], [place] [address]   MRSA PCR Screening     Status: Abnormal   Collection Time: 08/29/2019  6:32 PM   Specimen: Nasal Mucosa; Nasopharyngeal  Result Value Ref Range Status   MRSA by PCR POSITIVE (A) NEGATIVE Final    Comment:        The GeneXpert MRSA Assay (FDA approved for NASAL specimens only), is one component of a comprehensive MRSA colonization surveillance program. It is not intended to diagnose MRSA infection nor to guide or monitor treatment for MRSA infections. RESULT CALLED TO, READ BACK BY AND VERIFIED WITH: HEARN,RN AT 2155 ON 5.31.21 BY ISLEY,B Performed at [facility], [address] [place],  [address]          Radiology Studies: No results found.      Scheduled Meds: . Chlorhexidine Gluconate Cloth  6 each Topical Daily  . Chlorhexidine Gluconate Cloth  6 each Topical Q0600  . mouth rinse  15 mL Mouth Rinse Q4H  . mupirocin ointment   Nasal BID  . potassium chloride  40 mEq Oral Once  . sodium hypochlorite   Topical BID  . [START ON 10/01/2019] sodium hypochlorite   Topical BID   Continuous Infusions: . meropenem (MERREM) IV  500 mg (09/29/19 2143)     LOS: 3 days     Roxan Hockey, MD [facility]   If 7PM-7AM, please contact night-coverage [email or web address]  09/30/2019, 7:16 PM

## 2019-09-30 NOTE — Progress Notes (Signed)
Visited with patient, his son [name]. and his cousin. Provided emotional and spiritual support and prayer.

## 2019-09-30 NOTE — Plan of Care (Signed)

## 2019-10-01 LAB — CULTURE, BLOOD (ROUTINE X 2): Special Requests: ADEQUATE

## 2019-10-01 MED ORDER — CEFAZOLIN SODIUM-DEXTROSE 1-4 GM/50ML-% IV SOLN
1.0000 g | INTRAVENOUS | Status: DC
  Administered 2019-10-01: 1 g via INTRAVENOUS
  Filled 2019-10-01 (×4): qty 50

## 2019-10-01 NOTE — Plan of Care (Signed)

## 2019-10-01 NOTE — Progress Notes (Signed)
Changed dressing to left bka and sacrum.

## 2019-10-01 NOTE — Care Management Important Message (Signed)
Important Message  Patient Details  Name: Joseph UPCHURCH Sr. MRN: [id number] Date of Birth: 1940-07-11   Medicare Important Message Given:  Yes     Tommy Medal 10/01/2019, 1:06 PM

## 2019-10-01 NOTE — Progress Notes (Signed)
PROGRESS NOTE    [name]  VZC:[id number] DOB: 07-17-1940 DOA: 09/16/2019 PCP: [name], MD    Brief Narrative:  [name]. is a 79 y.o. 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  He was discharged to skilled nursing facility.  Patient had presented to the dialysis center today for routine dialysis when he reported that he was short of breath.  History is somewhat limited since patient cannot participate.  History is obtained from his daughter.  At the dialysis center, supplemental oxygen was applied, but he continued to be short of breath.  He was sent to the ER for evaluation.  Upon arrival to the emergency room, he was noted to be hypotensive, hypoxic.  Accurate vital signs were proved to be difficult since pulse ox did not have accurate waveform and blood pressure cuff had to be placed on his leg.  Initial blood gas showed pH of 7.46, PCO2 of 33, PO2 of 248 on 100% oxygen.  CBC showed WBC count of 40.3, up from 35.1, from 4 days earlier.  Potassium 3.2, BUN of 26, lactic acid of 3.4.  CT angiogram of the chest was negative for pulmonary embolism, but did show bilateral pleural effusions and left-sided pneumonia.  Patient was bolused with IV fluids per sepsis protocol.  He received intravenous antibiotics.  He was referred for admission. --- Overall prognosis is poor, not tolerating hemodialysis, anticipate transition to full comfort care  Assessment & Plan:   Active Problems:   ESRD on dialysis [name]   PVD (peripheral vascular disease) (HCC)   S/P transmetatarsal amputation of foot, right (HCC)   Leukocytosis   Acute metabolic encephalopathy   Chronic  hypotension   Severe sepsis with septic shock (HCC)   DNR (do not resuscitate)   Acute respiratory failure with hypoxia ([name]   HCAP (healthcare-associated pneumonia)   Chronic diastolic CHF (congestive heart failure) (HCC)   Bilateral pleural effusion   S/P AKA (above knee amputation), left ([name]   Anemia due to chronic kidney disease   Decubitus ulcer of sacral region, stage 4 (HCC)   E. coli sepsis (HCC)   1)E coli Severe sepsis with septic shock-  blood cultures from 09/18/2019 with pansensitive E. coli, okay to de-escalate to Ancef on 10/01/2019  -  Lactic acid elevated on admission but has trended down with IV fluids. Cortisol is appropriately elevated,  discontinued stress dose steroids already-------- Overall prognosis is poor, not tolerating hemodialysis, anticipate transition to full comfort care  2)Acute respiratory failure with hypoxia.  Related to pneumonia and pleural effusions. Initially on 100% NRB, -continue supplemental oxygen  3)Leukocytosis.  Marked leukocytosis, present since prior admission.  Worsening of WBC count related to sepsis. He is not having any diarrhea.    4)Healthcare associated Pneumonia-  patient was treated with vancomycin and meropenem-- -right knee on Ancef for E. coli bacteremia --- Overall prognosis is poor, not tolerating hemodialysis, anticipate transition to full comfort care  5)End-stage renal disease on hemodialysis.  Nephrology following.  Patient has been on hemodialysis for over 20 years, -Per nephrologist No further attempts at hemodialysis due to persistent hypotension with hemodialysis sessions  6)Peripheral vascular disease---  S/p left above-the-knee amputation.    Unable to resume on antiplatelet agents due to lethargy  and inability to  take oral intake  safely  7)Acute metabolic encephalopathy.  Likely related to sepsis/hypotension. He remains sleepy/lethargic  8)Chronic hypotension.  He is chronically on midodrine.  Resume once  mental status has improved and able to tolerate oral intake  9)Large sacral decubitus ulcer, unstageable-POA.  Wound care input appreciated. If patient stabilizes, can consider MRI of sacrum to evaluate for osteomyelitis, although I suspect this will be present considering the degree of the wound. -Initially treated with vancomycin and meropenem, deescalated to Ancef due to E. coli bacteremia  10)Goals of care.  Palliative care consulted to help assist in goals of care discussions. Palliative care consult appreciated ---- Overall prognosis is poor, not tolerating hemodialysis, anticipate transition to full comfort care. -Further discussions with patient's son at bedside and daughter,  they have not made a final decision on transition to full comfort care and hospice   DVT prophylaxis: heparin Code Status: DNR Family Communication: Palliative care services had meeting with family again on 09/30/2019   disposition Plan: Status is: Inpatient--- awaiting family's decision on possible transition to comfort care and hospice  Remains inpatient appropriate because:Inpatient level of care appropriate due to severity of illness   Dispo: The patient is from: SNF              Anticipated d/c is to: SN[facility]              Anticipated d/c date is: > 3 days              Patient currently is not medically stable to d/c. -awaiting family's decision on possible transition to comfort care and hospice  Consultants:   Palliative care  Nephrology  Wound care  Procedures:     Antimicrobials:   Vancomycin--stopped 10/01/19  Meropenem stopped 10/01/19 Started on ancef 10/01/19  Subjective: Patient is somnolent, able to wake up and answer yes or no questions -awaiting family's decision on possible transition to comfort care and hospice  -Appears comfortable overall   Objective: Vitals:   09/29/19 2000 09/29/19 2310 09/30/19 1221 09/30/19 2254  BP:   110/72 97/76  Pulse:  82 (!) 40 (!)  49  Resp:   16 16  Temp: 98.3 F (36.8 C) 98.3 F (36.8 C) 97.9 F (36.6 C) 99 F (37.2 C)  TempSrc: Axillary Axillary Oral Oral  SpO2:   99%   Weight:      Height:        Intake/Output Summary (Last 24 hours) at 10/01/2019 1037 Last data filed at 10/01/2019 0600 Gross per 24 hour  Intake 100 ml  Output 0 ml  Net 100 ml   Filed Weights   09/24/2019 1719 09/28/19 0628 09/28/19 1315  Weight: 51.9 kg 52.6 kg 52.7 kg    Examination:  General exam: somnolent, does not respond to voice Respiratory system: diminished breath sounds bilaterally. Respiratory effort normal. Cardiovascular system: S1 & S2 heard, RRR. No JVD, murmurs, rubs, gallops or clicks.  Gastrointestinal system: Abdomen is nondistended, soft and nontender. No organomegaly or masses felt. Normal bowel sounds heard. Central nervous system: limited exam due to mental status Extremities: LLE AKA, RLE TMA Skin: stage 4 sacral decubitus ulcer. Incision site of AKA with staples looks clean Psychiatry: somnolent    Data Reviewed:  CBC: Recent Labs  Lab 09/08/2019 1129 09/17/2019 1129 09/14/2019 1144 09/28/19 0450 09/28/19 2308 09/29/19 0616 09/30/19 0447  WBC 40.3*  --   --  42.0*  --  41.5* 36.4*  HGB 8.6*   < > 11.2* 7.9* 10.8* 11.5* 11.7*  HCT 27.9*   < > 33.0* 24.9* 33.8* 36.0* 37.5*  MCV 66.9*  --   --  65.5*  --  72.0* 72.1*  PLT 353  --   --  293  --  245 224   < > = values in this interval not displayed.   Basic Metabolic Panel: Recent Labs  Lab 09/17/2019 1129 09/19/2019 1144 09/28/19 0450 09/29/19 0451 09/30/19 0447  NA 138 137 138 138 139  K 3.2* 3.2* 3.5 3.4* 3.2*  CL 99 101 100 100 100  CO2 25  --  26 23 23   GLUCOSE 97 85 104* 85 76  BUN 26* 23 34* 24* 35*  CREATININE 2.76* 2.80* 3.26* 2.62* 3.55*  CALCIUM 8.5*  --  8.6* 9.1 8.8*   GFR: Estimated Creatinine Clearance: 13.2 mL/min (A) (by C-G formula based on SCr of 3.55 mg/dL (H)). Liver Function Tests: Recent Labs  Lab 09/04/2019 1129  09/28/19 0450  AST 26 18  ALT 6 7  ALKPHOS 70 58  BILITOT 1.6* 1.1  PROT 6.7 5.8*  ALBUMIN 2.1* 1.7*   No results for input(s): LIPASE, AMYLASE in the last 168 hours. No results for input(s): AMMONIA in the last 168 hours. Coagulation Profile: Recent Labs  Lab 09/12/2019 1129  INR 1.4*   Cardiac Enzymes: No results for input(s): CKTOTAL, CKMB, CKMBINDEX, TROPONINI in the last 168 hours. BNP (last 3 results) No results for input(s): PROBNP in the last 8760 hours. HbA1C: No results for input(s): HGBA1C in the last 72 hours. CBG: Recent Labs  Lab 09/28/19 0631 09/28/19 0741 09/28/19 1143  GLUCAP 76 95 107*   Lipid Profile: No results for input(s): CHOL, HDL, LDLCALC, TRIG, CHOLHDL, LDLDIRECT in the last 72 hours. Thyroid Function Tests: No results for input(s): TSH, T4TOTAL, FREET4, T3FREE, THYROIDAB in the last 72 hours. Anemia Panel: No results for input(s): VITAMINB12, FOLATE, FERRITIN, TIBC, IRON, RETICCTPCT in the last 72 hours. Sepsis Labs: Recent Labs  Lab 09/02/2019 1129 09/24/2019 1729 09/19/2019 2041  PROCALCITON  --   --  7.72  LATICACIDVEN 3.4* 1.5 0.9    Recent Results (from the past 240 hour(s))  Blood culture (routine x 2)     Status: Abnormal   Collection Time: 09/26/2019 11:29 AM   Specimen: Right Antecubital; Blood  Result Value Ref Range Status   Specimen Description   Final    RIGHT ANTECUBITAL Performed at [facility], [address], [name], [place] [address]    Special Requests   Final    BOTTLES DRAWN AEROBIC ONLY Blood Culture adequate volume Performed at [facility], [address] [place], [place] [address]    Culture  Setup Time   Final    AEROBIC BOTTLE ONLY GRAM NEGATIVE RODS Gram Stain Report Called to,Read Back By and Verified With: H TETREAULT,RN @0335  09/28/19 MKELLY Organism ID to follow Performed at [facility], [place] [address], [place], [place] [address]    Culture ESCHERICHIA COLI (A)  Final   Report Status  09/30/2019 FINAL  Final   Organism ID, Bacteria ESCHERICHIA COLI  Final      Susceptibility   Escherichia coli - MIC*    AMPICILLIN 8 SENSITIVE Sensitive     CEFAZOLIN <=4 SENSITIVE Sensitive     CEFEPIME <=1 SENSITIVE Sensitive     CEFTAZIDIME <=1 SENSITIVE Sensitive     CEFTRIAXONE <=1 SENSITIVE Sensitive     CIPROFLOXACIN <=0.25 SENSITIVE Sensitive  GENTAMICIN <=1 SENSITIVE Sensitive     IMIPENEM <=0.25 SENSITIVE Sensitive     TRIMETH/SULFA <=20 SENSITIVE Sensitive     AMPICILLIN/SULBACTAM 4 SENSITIVE Sensitive     PIP/TAZO <=4 SENSITIVE Sensitive     * ESCHERICHIA COLI  Blood culture (routine x 2)     Status: Abnormal   Collection Time: 09/01/2019 11:29 AM   Specimen: Right Antecubital; Blood  Result Value Ref Range Status   Specimen Description   Final    RIGHT ANTECUBITAL Performed at [facility], [address], [place], [place] [address]    Special Requests   Final    BOTTLES DRAWN AEROBIC ONLY Blood Culture adequate volume Performed at [place] [place], [place] [address]    Culture  Setup Time   Final    AEROBIC BOTTLE ONLY GRAM NEGATIVE RODS Gram Stain Report Called to,Read Back By and Verified With: J HEARN,RN @0112  09/29/19 MKELLY    Culture (A)  Final    ESCHERICHIA COLI SUSCEPTIBILITIES PERFORMED ON PREVIOUS CULTURE WITHIN THE LAST 5 DAYS. Performed at [facility], [place] [address] [place], [place] [address]    Report Status 10/01/2019 FINAL  Final  Blood Culture ID Panel (Reflexed)     Status: Abnormal   Collection Time: 09/09/2019 11:29 AM  Result Value Ref Range Status   Enterococcus species NOT DETECTED NOT DETECTED Final   Listeria monocytogenes NOT DETECTED NOT DETECTED Final   Staphylococcus species NOT DETECTED NOT DETECTED Final   Staphylococcus aureus (BCID) NOT DETECTED NOT DETECTED Final   Streptococcus species NOT DETECTED NOT DETECTED Final   Streptococcus agalactiae NOT DETECTED NOT DETECTED Final   Streptococcus  pneumoniae NOT DETECTED NOT DETECTED Final   Streptococcus pyogenes NOT DETECTED NOT DETECTED Final   Acinetobacter baumannii NOT DETECTED NOT DETECTED Final   Enterobacteriaceae species DETECTED (A) NOT DETECTED Final    Comment: Enterobacteriaceae represent a large family of gram-negative bacteria, not a single organism. CRITICAL RESULT CALLED TO, READ BACK BY AND VERIFIED WITH: Gloris Manchester PharmD 8:45 09/28/19 ([email or web address])    Enterobacter cloacae complex NOT DETECTED NOT DETECTED Final   Escherichia coli DETECTED (A) NOT DETECTED Final    Comment: CRITICAL RESULT CALLED TO, READ BACK BY AND VERIFIED WITH: Gloris Manchester PharmD 8:45 09/28/19 ([email or web address])    Klebsiella oxytoca NOT DETECTED NOT DETECTED Final   Klebsiella pneumoniae NOT DETECTED NOT DETECTED Final   Proteus species NOT DETECTED NOT DETECTED Final   Serratia marcescens NOT DETECTED NOT DETECTED Final   Carbapenem resistance NOT DETECTED NOT DETECTED Final   Haemophilus influenzae NOT DETECTED NOT DETECTED Final   Neisseria meningitidis NOT DETECTED NOT DETECTED Final   Pseudomonas aeruginosa NOT DETECTED NOT DETECTED Final   Candida albicans NOT DETECTED NOT DETECTED Final   Candida glabrata NOT DETECTED NOT DETECTED Final   Candida krusei NOT DETECTED NOT DETECTED Final   Candida parapsilosis NOT DETECTED NOT DETECTED Final   Candida tropicalis NOT DETECTED NOT DETECTED Final    Comment: Performed at [facility], [place] [address], [place], [place] [address]  SARS Coronavirus 2 by RT PCR (hospital order, performed in [facility] hospital lab) Nasopharyngeal Nasopharyngeal Swab     Status: None   Collection Time: 09/23/2019 12:29 PM   Specimen: Nasopharyngeal Swab  Result Value Ref Range Status   SARS Coronavirus 2 NEGATIVE NEGATIVE Final    Comment: (NOTE) SARS-CoV-2 target nucleic acids are NOT DETECTED. The SARS-CoV-2 RNA is generally detectable in upper and  lower respiratory specimens during the acute phase of infection. The  lowest concentration of SARS-CoV-2 viral copies this assay can detect is 250 copies / mL. A negative result does not preclude SARS-CoV-2 infection and should not be used as the sole basis for treatment or other patient management decisions.  A negative result may occur with improper specimen collection / handling, submission of specimen other than nasopharyngeal swab, presence of viral mutation(s) within the areas targeted by this assay, and inadequate number of viral copies (<250 copies / mL). A negative result must be combined with clinical observations, patient history, and epidemiological information. Fact Sheet for Patients:   StrictlyIdeas.no Fact Sheet for Healthcare Providers: BankingDealers.co.za This test is not yet approved or cleared  by the [place] FDA and has been authorized for detection and/or diagnosis of SARS-CoV-2 by FDA under an Emergency Use Authorization (EUA).  This EUA will remain in effect (meaning this test can be used) for the duration of the COVID-19 declaration under Section 564(b)(1) of the Act, 21 U.S.C. section 360bbb-3(b)(1), unless the authorization is terminated or revoked sooner. Performed at [facility], [address], [place], [place] [address]   MRSA PCR Screening     Status: Abnormal   Collection Time: 09/25/2019  6:32 PM   Specimen: Nasal Mucosa; Nasopharyngeal  Result Value Ref Range Status   MRSA by PCR POSITIVE (A) NEGATIVE Final    Comment:        The GeneXpert MRSA Assay (FDA approved for NASAL specimens only), is one component of a comprehensive MRSA colonization surveillance program. It is not intended to diagnose MRSA infection nor to guide or monitor treatment for MRSA infections. RESULT CALLED TO, READ BACK BY AND VERIFIED WITH: HEARN,RN AT 2155 ON 5.31.21 BY ISLEY,B Performed at [facility], [address], [place], [place] [address]          Radiology Studies: No  results found.      Scheduled Meds: . Chlorhexidine Gluconate Cloth  6 each Topical Daily  . Chlorhexidine Gluconate Cloth  6 each Topical Q0600  . mouth rinse  15 mL Mouth Rinse Q4H  . mupirocin ointment   Nasal BID  . potassium chloride  40 mEq Oral Once  . sodium hypochlorite   Topical BID   Continuous Infusions: .  ceFAZolin (ANCEF) IV       LOS: 4 days     Roxan Hockey, MD [facility]   If 7PM-7AM, please contact night-coverage [email or web address]  10/01/2019, 10:37 AM

## 2019-10-01 NOTE — Progress Notes (Signed)
Palliative:  HPI:78 y.o.malewith past medical history of ESRD on HD MWF, PVD, s/p left AKA, s/p right transmetatarsal amputation, HFpEF, chronic hypotension on midodrine, overall failure to thrive, sacral decubitus ulceradmitted on 5/31/2021with shortness of breath at dialysis center and found to have bilateral pleural effusions and leftlower lobepneumonia sepsis.Poor candidate for CRRT due to overall poor prognosis with failure to thrive. Attempt for iHD today but did not tolerate yesterday. Palliative consulted to assist with goals of care conversation as he is unlikely to tolerate further dialysis and consideration of comfort care would be appropriate.   I met again today with Mr. Beck. He is again more alert and tells me that his "right leg hurts" and I repositioned his leg. Offered pillow to prop it up but he did not want a pillow. He reports relief after slight positioning readjustment. He reports being comfortable otherwise. We again discussed that he can no longer receive dialysis and goal is comfort and he nods his head yes that he understands. He then tells me "I want to go home." Difficult to understand his speech. I clarified with him what he means by home here or home meaning heaven and he says home here. I expressed concern that there is nobody to be with him at home to take care of him and he tells me there is. I explained that I will need to speak with his doctor and his family to see what can be done. He denies any other needs or discomfort.  I visited multiple times but no family at bedside and Mr. Naithen is resting comfortably. I called daughter, [name], and discussed with her above conversation but that he remains comfortable. I further discussed with her goal for comfort and recommendation to stop antibiotics and provide medications only that will add to comfort and she agrees. She agrees full comfort care is appropriate and would be her father's wish given circumstances with no  further dialysis. Greatest concern currently is for son, Brooke Bonito, who refers to [facility] for ultimate decision making. I asked [name] how Brooke Bonito is doing and she reports that he is no more accepting and still says that he feels that Mr. Orvill will ultimately improve. We again discussed option of hospice and she will consider this option.   All questions/concerns addressed. Emotional support provided. Discussed plan with Dr. Denton Brick.   Exam: Awakens to voice. Verbalizes some requests but still difficult to understand. Lethargic. No distress. HR bradycardic. Breathing regular, unlabored. Worsening overall edema especially in BUE.   Plan: - Full comfort care. - D/C antibiotics.  - Family considering hospice facility.   25 min  Vinie Sill, NP Palliative Medicine Team Pager [phone] (Please see [email or web address] for schedule) Team Phone [phone]    Greater than 50%  of this time was spent counseling and coordinating care related to the above assessment and plan

## 2019-10-02 DIAGNOSIS — Z515 Encounter for palliative care: Secondary | ICD-10-CM

## 2019-10-02 DIAGNOSIS — Z7189 Other specified counseling: Secondary | ICD-10-CM

## 2019-10-02 NOTE — Progress Notes (Addendum)
PROGRESS NOTE    [name]  MWU:[id number] DOB: May 11, 1940 DOA: 09/26/2019 PCP: [name], MD    Brief Narrative:  Joseph Haugan. is a 79 y.o. 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  He was discharged to skilled nursing facility.  Patient had presented to the dialysis center today for routine dialysis when he reported that he was short of breath.  History is somewhat limited since patient cannot participate.  History is obtained from his daughter.  At the dialysis center, supplemental oxygen was applied, but he continued to be short of breath.  He was sent to the ER for evaluation.  Upon arrival to the emergency room, he was noted to be hypotensive, hypoxic.  Accurate vital signs were proved to be difficult since pulse ox did not have accurate waveform and blood pressure cuff had to be placed on his leg.  Initial blood gas showed pH of 7.46, PCO2 of 33, PO2 of 248 on 100% oxygen.  CBC showed WBC count of 40.3, up from 35.1, from 4 days earlier.  Potassium 3.2, BUN of 26, lactic acid of 3.4.  CT angiogram of the chest was negative for pulmonary embolism, but did show bilateral pleural effusions and left-sided pneumonia.  Patient was bolused with IV fluids per sepsis protocol.  He received intravenous antibiotics.  He was referred for admission. --- Overall prognosis is poor, not tolerating hemodialysis, patient's daughter after further discussions with palliative care team has agreed to transition to full comfort care, however she requested that we hold off on transfer to residential hospice until patient's son is in agreement  Assessment & Plan:   Principal Problem:  End of life care--- full comfort measures Active Problems:   DNR (do not resuscitate)   Encounter for hospice care discussion--- full comfort care/palliative care   ESRD on dialysis [facility])   PVD (peripheral vascular disease) ([place])   S/P transmetatarsal amputation of foot, right ([place])   Leukocytosis   Acute metabolic encephalopathy   Chronic hypotension   Severe sepsis with septic shock ([place])   Acute respiratory failure with hypoxia ([place])   HCAP (healthcare-associated pneumonia)   Chronic diastolic CHF (congestive heart failure) ([facility])   Bilateral pleural effusion   S/P AKA (above knee amputation), left ([place])   Anemia due to chronic kidney disease   Decubitus ulcer of sacral region, stage 4 ([place])   E. coli sepsis ([place])   1)E coli Severe sepsis with septic shock-  blood cultures from 09/17/2019 with pansensitive E. coli, okay to de-escalate to Ancef on 10/01/2019  -  Lactic acid elevated on admission but has trended down with IV fluids. Cortisol is appropriately elevated,  discontinued stress dose steroids already-------- Overall prognosis is very poor, not tolerating hemodialysis, patient's daughter after further discussions with palliative care team has agreed to transition to full comfort care on 10/01/19, however she requested that we hold off on transfer to residential hospice until patient's son is in agreement  2)Acute respiratory failure with hypoxia.  Related to pneumonia and pleural effusions. Initially on 100% NRB, -continue supplemental oxygen  3)-Full comfort care measures- Overall prognosis is very poor, not tolerating hemodialysis, patient's daughter after further discussions with palliative care team has agreed to transition to full comfort care, however she  requested that we hold off on transfer to residential hospice until patient's son is in agreement   4)Healthcare associated Pneumonia-  patient was treated with vancomycin and meropenem-- -right knee on Ancef for E. coli  bacteremia --- Overall prognosis is poor, not tolerating hemodialysis,  transitioned to full comfort care  5)End-stage renal disease on hemodialysis.  Nephrology following.  Patient has been on hemodialysis for over 20 years, -Per nephrologist No further attempts at hemodialysis due to persistent hypotension with hemodialysis sessions  6)Peripheral vascular disease---  S/p left above-the-knee amputation.    Unable to resume on antiplatelet agents due to lethargy and inability to  take oral intake  safely  7)Acute metabolic encephalopathy.  Likely related to sepsis/hypotension. He remains sleepy/lethargic  8)Chronic hypotension.  He is chronically on midodrine.  Resume once mental status has improved and able to tolerate oral intake  9)Large sacral decubitus ulcer, unstageable-POA.  Wound care input appreciated. If patient stabilizes, can consider MRI of sacrum to evaluate for osteomyelitis, although I suspect this will be present considering the degree of the wound. -Initially treated with vancomycin and meropenem, deescalated to Ancef due to E. coli bacteremia -Antibiotics discontinued on 10/01/2019  10)Goals of care.  Palliative care consulted to help assist in goals of care discussions. Palliative care consult appreciated ---Overall prognosis is very poor, not tolerating hemodialysis, patient's daughter after further discussions with palliative care team has agreed to transition to full comfort care, however she requested that we hold off on transfer to residential hospice until patient's son is in agreement   DVT prophylaxis: heparin Code Status: DNR Family Communication: Palliative care services had meeting with family again on 09/30/2019   disposition Plan: Status is: Inpatient--- awaiting family's decision on possible transition to comfort care and hospice  Remains inpatient appropriate because:Inpatient level of care appropriate due to severity of illness   Dispo: The patient is  from: SNF              Anticipated d/c is to: SNF [facility] Hosp[facility]              Anticipated d/c date is: > 3 days              Patient currently is not medically stable to d/c. -Awaiting family decision on transfer to residential hospice house  Consultants:   Palliative care  Nephrology  Wound care  Procedures:     Antimicrobials:   Vancomycin--stopped 10/01/19  Meropenem stopped 10/01/19 Started on ancef 10/01/19,  --Off antibiotics since 10/01/2019  Subjective:  -Appears comfortable, able to answer yes and no questions   Objective: Vitals:   09/30/19 1221 09/30/19 2254 10/01/19 1826 10/02/19 0509  BP: 110/72 97/76 (!) 68/31 (!) 104/52  Pulse: (!) 40 (!) 49 (!) 163 78  Resp: 16 16  16   Temp: 97.9 F (36.6 C) 99 F (37.2 C)  98.5 F (36.9 C)  TempSrc: Oral Oral  Oral  SpO2: 99%     Weight:      Height:        Intake/Output Summary (Last 24 hours) at 10/02/2019 1742 Last data filed at 10/02/2019 1300 Gross per 24 hour  Intake 0 ml  Output --  Net 0 ml   Filed Weights   08/29/2019 1719 09/28/19 0628 09/28/19 1315  Weight: 51.9 kg 52.6 kg 52.7 kg    Examination:  General exam: somnolent, does not respond to voice Respiratory system: diminished breath sounds bilaterally. Respiratory effort normal. Cardiovascular system: S1 & S2  heard, RRR. Gastrointestinal system: Abdomen is nondistended, soft and nontender. No organomegaly or masses felt. Normal bowel sounds heard. Central nervous system: limited exam due to mental status Extremities: LLE AKA, RLE TMA Skin: stage 4 sacral decubitus ulcer. Incision site of AKA with staples looks clean Psychiatry: somnolent    Data Reviewed:  CBC: Recent Labs  Lab 09/17/2019 1129 09/17/2019 1129 09/14/2019 1144 09/28/19 0450 09/28/19 2308 09/29/19 0616 09/30/19 0447  WBC 40.3*  --   --  42.0*  --  41.5* 36.4*  HGB 8.6*   < > 11.2* 7.9* 10.8* 11.5* 11.7*  HCT 27.9*   < > 33.0* 24.9* 33.8* 36.0* 37.5*  MCV 66.9*  --   --   65.5*  --  72.0* 72.1*  PLT 353  --   --  293  --  245 224   < > = values in this interval not displayed.   Basic Metabolic Panel: Recent Labs  Lab 09/06/2019 1129 09/24/2019 1144 09/28/19 0450 09/29/19 0451 09/30/19 0447  NA 138 137 138 138 139  K 3.2* 3.2* 3.5 3.4* 3.2*  CL 99 101 100 100 100  CO2 25  --  26 23 23   GLUCOSE 97 85 104* 85 76  BUN 26* 23 34* 24* 35*  CREATININE 2.76* 2.80* 3.26* 2.62* 3.55*  CALCIUM 8.5*  --  8.6* 9.1 8.8*   GFR: Estimated Creatinine Clearance: 13.2 mL/min (A) (by C-G formula based on SCr of 3.55 mg/dL (H)). Liver Function Tests: Recent Labs  Lab 09/21/2019 1129 09/28/19 0450  AST 26 18  ALT 6 7  ALKPHOS 70 58  BILITOT 1.6* 1.1  PROT 6.7 5.8*  ALBUMIN 2.1* 1.7*   No results for input(s): LIPASE, AMYLASE in the last 168 hours. No results for input(s): AMMONIA in the last 168 hours. Coagulation Profile: Recent Labs  Lab 09/25/2019 1129  INR 1.4*   Cardiac Enzymes: No results for input(s): CKTOTAL, CKMB, CKMBINDEX, TROPONINI in the last 168 hours. BNP (last 3 results) No results for input(s): PROBNP in the last 8760 hours. HbA1C: No results for input(s): HGBA1C in the last 72 hours. CBG: Recent Labs  Lab 09/28/19 0631 09/28/19 0741 09/28/19 1143  GLUCAP 76 95 107*   Lipid Profile: No results for input(s): CHOL, HDL, LDLCALC, TRIG, CHOLHDL, LDLDIRECT in the last 72 hours. Thyroid Function Tests: No results for input(s): TSH, T4TOTAL, FREET4, T3FREE, THYROIDAB in the last 72 hours. Anemia Panel: No results for input(s): VITAMINB12, FOLATE, FERRITIN, TIBC, IRON, RETICCTPCT in the last 72 hours. Sepsis Labs: Recent Labs  Lab 09/22/2019 1129 09/26/2019 1729 09/25/2019 2041  PROCALCITON  --   --  7.72  LATICACIDVEN 3.4* 1.5 0.9    Recent Results (from the past 240 hour(s))  Blood culture (routine x 2)     Status: Abnormal   Collection Time: 09/01/2019 11:29 AM   Specimen: Right Antecubital; Blood  Result Value Ref Range Status    Specimen Description   Final    RIGHT ANTECUBITAL Performed at [facility], [address] [place], [place] [address]    Special Requests   Final    BOTTLES DRAWN AEROBIC ONLY Blood Culture adequate volume Performed at [facility], [address], [place], [place] [address]    Culture  Setup Time   Final    AEROBIC BOTTLE ONLY GRAM NEGATIVE RODS Gram Stain Report Called to,Read Back By and Verified With: H TETREAULT,RN @0335  09/28/19 MKELLY Organism ID to follow Performed at [facility], [place]  [address], [place], [place] [address]    Culture ESCHERICHIA COLI (A)  Final   Report Status 09/30/2019 FINAL  Final   Organism ID, Bacteria ESCHERICHIA COLI  Final      Susceptibility   Escherichia coli - MIC*    AMPICILLIN 8 SENSITIVE Sensitive     CEFAZOLIN <=4 SENSITIVE Sensitive     CEFEPIME <=1 SENSITIVE Sensitive     CEFTAZIDIME <=1 SENSITIVE Sensitive     CEFTRIAXONE <=1 SENSITIVE Sensitive     CIPROFLOXACIN <=0.25 SENSITIVE Sensitive     GENTAMICIN <=1 SENSITIVE Sensitive     IMIPENEM <=0.25 SENSITIVE Sensitive     TRIMETH/SULFA <=20 SENSITIVE Sensitive     AMPICILLIN/SULBACTAM 4 SENSITIVE Sensitive     PIP/TAZO <=4 SENSITIVE Sensitive     * ESCHERICHIA COLI  Blood culture (routine x 2)     Status: Abnormal   Collection Time: 09/24/2019 11:29 AM   Specimen: Right Antecubital; Blood  Result Value Ref Range Status   Specimen Description   Final    RIGHT ANTECUBITAL Performed at [facility], [address], [place], [place] [address]    Special Requests   Final    BOTTLES DRAWN AEROBIC ONLY Blood Culture adequate volume Performed at [facility], [address] [place], [place] [address]    Culture  Setup Time   Final    AEROBIC BOTTLE ONLY GRAM NEGATIVE RODS Gram Stain Report Called to,Read Back By and Verified With: J HEARN,RN @0112  09/29/19 MKELLY    Culture (A)  Final    ESCHERICHIA COLI SUSCEPTIBILITIES PERFORMED ON PREVIOUS CULTURE WITHIN THE LAST 5  DAYS. Performed at [facility], [place] [address], [place], [place] [address]    Report Status 10/01/2019 FINAL  Final  Blood Culture ID Panel (Reflexed)     Status: Abnormal   Collection Time: 09/07/2019 11:29 AM  Result Value Ref Range Status   Enterococcus species NOT DETECTED NOT DETECTED Final   Listeria monocytogenes NOT DETECTED NOT DETECTED Final   Staphylococcus species NOT DETECTED NOT DETECTED Final   Staphylococcus aureus (BCID) NOT DETECTED NOT DETECTED Final   Streptococcus species NOT DETECTED NOT DETECTED Final   Streptococcus agalactiae NOT DETECTED NOT DETECTED Final   Streptococcus pneumoniae NOT DETECTED NOT DETECTED Final   Streptococcus pyogenes NOT DETECTED NOT DETECTED Final   Acinetobacter baumannii NOT DETECTED NOT DETECTED Final   Enterobacteriaceae species DETECTED (A) NOT DETECTED Final    Comment: Enterobacteriaceae represent a large family of gram-negative bacteria, not a single organism. CRITICAL RESULT CALLED TO, READ BACK BY AND VERIFIED WITH: Gloris Manchester PharmD 8:45 09/28/19 (wilsonm)    Enterobacter cloacae complex NOT DETECTED NOT DETECTED Final   Escherichia coli DETECTED (A) NOT DETECTED Final    Comment: CRITICAL RESULT CALLED TO, READ BACK BY AND VERIFIED WITH: Gloris Manchester PharmD 8:45 09/28/19 ([email or web address])    Klebsiella oxytoca NOT DETECTED NOT DETECTED Final   Klebsiella pneumoniae NOT DETECTED NOT DETECTED Final   Proteus species NOT DETECTED NOT DETECTED Final   Serratia marcescens NOT DETECTED NOT DETECTED Final   Carbapenem resistance NOT DETECTED NOT DETECTED Final   Haemophilus influenzae NOT DETECTED NOT DETECTED Final   Neisseria meningitidis NOT DETECTED NOT DETECTED Final   Pseudomonas aeruginosa NOT DETECTED NOT DETECTED Final   Candida albicans NOT DETECTED NOT DETECTED Final   Candida glabrata NOT DETECTED NOT DETECTED Final   Candida krusei NOT DETECTED NOT DETECTED Final   Candida parapsilosis NOT DETECTED NOT DETECTED Final  Candida tropicalis NOT DETECTED NOT DETECTED Final    Comment: Performed at [facility], [place] [address], [place], [place] [address]  SARS Coronavirus 2 by RT PCR (hospital order, performed in [facility] hospital lab) Nasopharyngeal Nasopharyngeal Swab     Status: None   Collection Time: 09/24/2019 12:29 PM   Specimen: Nasopharyngeal Swab  Result Value Ref Range Status   SARS Coronavirus 2 NEGATIVE NEGATIVE Final    Comment: (NOTE) SARS-CoV-2 target nucleic acids are NOT DETECTED. The SARS-CoV-2 RNA is generally detectable in upper and lower respiratory specimens during the acute phase of infection. The lowest concentration of SARS-CoV-2 viral copies this assay can detect is 250 copies / mL. A negative result does not preclude SARS-CoV-2 infection and should not be used as the sole basis for treatment or other patient management decisions.  A negative result may occur with improper specimen collection / handling, submission of specimen other than nasopharyngeal swab, presence of viral mutation(s) within the areas targeted by this assay, and inadequate number of viral copies (<250 copies / mL). A negative result must be combined with clinical observations, patient history, and epidemiological information. Fact Sheet for Patients:   StrictlyIdeas.no Fact Sheet for Healthcare Providers: BankingDealers.co.za This test is not yet approved or cleared  by the [place] FDA and has been authorized for detection and/or diagnosis of SARS-CoV-2 by FDA under an Emergency Use Authorization (EUA).  This EUA will remain in effect (meaning this test can be used) for the duration of the COVID-19 declaration under Section 564(b)(1) of the Act, 21 U.S.C. section 360bbb-3(b)(1), unless the authorization is terminated or revoked sooner. Performed at [facility], [address] [place], [place] [address]   MRSA PCR Screening     Status: Abnormal    Collection Time: 09/21/2019  6:32 PM   Specimen: Nasal Mucosa; Nasopharyngeal  Result Value Ref Range Status   MRSA by PCR POSITIVE (A) NEGATIVE Final    Comment:        The GeneXpert MRSA Assay (FDA approved for NASAL specimens only), is one component of a comprehensive MRSA colonization surveillance program. It is not intended to diagnose MRSA infection nor to guide or monitor treatment for MRSA infections. RESULT CALLED TO, READ BACK BY AND VERIFIED WITH: HEARN,RN AT 2155 ON 5.31.21 BY ISLEY,B Performed at [facility], [address], [place], [place] [address]          Radiology Studies: No results found.      Scheduled Meds: . Chlorhexidine Gluconate Cloth  6 each Topical Daily  . Chlorhexidine Gluconate Cloth  6 each Topical Q0600  . mouth rinse  15 mL Mouth Rinse Q4H  . mupirocin ointment   Nasal BID  . sodium hypochlorite   Topical BID   Continuous Infusions:    LOS: 5 days     Roxan Hockey, MD [facility]   If 7PM-7AM, please contact night-coverage [email or web address]  10/02/2019, 5:42 PM

## 2019-10-02 NOTE — Progress Notes (Signed)
Sacral dressing changed and mouth care performed today.

## 2019-10-03 NOTE — Plan of Care (Signed)

## 2019-10-03 NOTE — Progress Notes (Signed)
PROGRESS NOTE    [name]  GMW:[id number] DOB: 1940/06/01 DOA: 09/16/2019 PCP: [name], MD    Brief Narrative:  Joseph Carias. is a 79 y.o. 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  He was discharged to skilled nursing facility.  Patient had presented to the dialysis center today for routine dialysis when he reported that he was short of breath.  History is somewhat limited since patient cannot participate.  History is obtained from his daughter.  At the dialysis center, supplemental oxygen was applied, but he continued to be short of breath.  He was sent to the ER for evaluation.  Upon arrival to the emergency room, he was noted to be hypotensive, hypoxic.  Accurate vital signs were proved to be difficult since pulse ox did not have accurate waveform and blood pressure cuff had to be placed on his leg.  Initial blood gas showed pH of 7.46, PCO2 of 33, PO2 of 248 on 100% oxygen.  CBC showed WBC count of 40.3, up from 35.1, from 4 days earlier.  Potassium 3.2, BUN of 26, lactic acid of 3.4.  CT angiogram of the chest was negative for pulmonary embolism, but did show bilateral pleural effusions and left-sided pneumonia.  Patient was bolused with IV fluids per sepsis protocol.  He received intravenous antibiotics.  He was referred for admission. --- Overall prognosis is poor, not tolerating hemodialysis, patient's daughter after further discussions with palliative care team has agreed to transition to full comfort care, however she requested that we hold off on transfer to residential hospice until patient's son is in agreement  Assessment & Plan:   Principal Problem:  End of life care--- full comfort measures Active Problems:   DNR (do not resuscitate)   Encounter for hospice care discussion--- full comfort care/palliative care   ESRD on dialysis [facility])   PVD (peripheral vascular disease) ([place])   S/P transmetatarsal amputation of foot, right ([place])   Leukocytosis   Acute metabolic encephalopathy   Chronic hypotension   Severe sepsis with septic shock ([place])   Acute respiratory failure with hypoxia ([place])   HCAP (healthcare-associated pneumonia)   Chronic diastolic CHF (congestive heart failure) ([facility])   Bilateral pleural effusion   S/P AKA (above knee amputation), left ([place])   Anemia due to chronic kidney disease   Decubitus ulcer of sacral region, stage 4 ([place])   E. coli sepsis ([place])   1)E coli Severe sepsis with septic shock-  blood cultures from 09/26/2019 with pansensitive E. coli, okay to de-escalate to Ancef on 10/01/2019  -  Lactic acid elevated on admission but has trended down with IV fluids. Cortisol is appropriately elevated,  discontinued stress dose steroids already-------- Overall prognosis is very poor, not tolerating hemodialysis, patient's daughter after further discussions with palliative care team has agreed to transition to full comfort care on 10/01/19, however she requested that we hold off on transfer to residential hospice until patient's son is in agreement  2)Acute respiratory failure with hypoxia.  Related to pneumonia and pleural effusions. Initially on 100% NRB, -continue supplemental oxygen  3)-Full comfort care measures- Overall prognosis is very poor, not tolerating hemodialysis, patient's daughter after further discussions with palliative care team has agreed to transition to full comfort care, however she  requested that we hold off on transfer to residential hospice until patient's son is in agreement   4)Healthcare associated Pneumonia-  patient was treated with vancomycin and meropenem-- -right knee on Ancef for E. coli  bacteremia --- Overall prognosis is poor, not tolerating hemodialysis,  transitioned to full comfort care  5)End-stage renal disease on hemodialysis.  Nephrology following.  Patient has been on hemodialysis for over 20 years, -Per nephrologist No further attempts at hemodialysis due to persistent hypotension with hemodialysis sessions  6)Peripheral vascular disease---  S/p left above-the-knee amputation.    Unable to resume on antiplatelet agents due to lethargy and inability to  take oral intake  safely  7)Acute metabolic encephalopathy.  Likely related to sepsis/hypotension. He remains sleepy/lethargic  8)Chronic hypotension.  He is chronically on midodrine.  Resume once mental status has improved and able to tolerate oral intake  9)Large sacral decubitus ulcer, unstageable-POA.  Wound care input appreciated. If patient stabilizes, can consider MRI of sacrum to evaluate for osteomyelitis, although I suspect this will be present considering the degree of the wound. -Initially treated with vancomycin and meropenem, deescalated to Ancef due to E. coli bacteremia -Antibiotics discontinued on 10/01/2019  10)Goals of care.  Palliative care consulted to help assist in goals of care discussions. Palliative care consult appreciated ---Overall prognosis is very poor, not tolerating hemodialysis, patient's daughter after further discussions with palliative care team has agreed to transition to full comfort care, however she requested that we hold off on transfer to residential hospice until patient's son is in agreement   DVT prophylaxis: heparin Code Status: DNR Family Communication: Palliative care services had meeting with family again on 09/30/2019   disposition Plan: Status is: Inpatient--- awaiting family's decision on possible transition to comfort care and hospice  Remains inpatient appropriate because:Inpatient level of care appropriate due to severity of illness   Dispo: The patient is  from: SNF              Anticipated d/c is to: SNF [facility] Hospice House              Anticipated d/c date is: 1 or 2 days               Patient currently -Awaiting family decision on transfer to residential hospice house  Consultants:   Palliative care  Nephrology  Wound care  Procedures:     Antimicrobials:   Vancomycin--stopped 10/01/19  Meropenem stopped 10/01/19 Started on ancef 10/01/19,  --Off antibiotics since 10/01/2019  Subjective:  -Resting comfortably, -Mumbling 1 or 2 wordes here in the, RN [place] at bedside   Objective: Vitals:   09/30/19 2254 10/01/19 1826 10/02/19 0509 10/03/19 1537  BP: 97/76 (!) 68/31 (!) 104/52 94/69  Pulse: (!) 49 (!) 163 78 91  Resp: 16  16 18   Temp: 99 F (37.2 C)  98.5 F (36.9 C) 97.6 F (36.4 C)  TempSrc: Oral  Oral Oral  SpO2:    97%  Weight:      Height:       No intake or output data in the 24 hours ending 10/03/19 1640 Filed Weights   09/23/2019 1719 09/28/19 0628 09/28/19 1315  Weight: 51.9 kg 52.6 kg 52.7 kg    Examination:  General exam: somnolent, mumbling from time to time Respiratory system: diminished breath sounds bilaterally. Respiratory effort normal. Cardiovascular system: S1 & S2 heard, RRR. Gastrointestinal system: Abdomen is nondistended, soft and nontender.  Normal bowel sounds heard. Central nervous system: limited exam  due to mental status Extremities: LLE AKA, RLE TMA Skin: stage 4 sacral decubitus ulcer. Incision site of AKA with staples looks clean Psychiatry: somnolent    Data Reviewed:  CBC: Recent Labs  Lab 09/05/2019 1129 09/18/2019 1129 09/09/2019 1144 09/28/19 0450 09/28/19 2308 09/29/19 0616 09/30/19 0447  WBC 40.3*  --   --  42.0*  --  41.5* 36.4*  HGB 8.6*   < > 11.2* 7.9* 10.8* 11.5* 11.7*  HCT 27.9*   < > 33.0* 24.9* 33.8* 36.0* 37.5*  MCV 66.9*  --   --  65.5*  --  72.0* 72.1*  PLT 353  --   --  293  --  245 224   < > = values in this interval not displayed.   Basic Metabolic  Panel: Recent Labs  Lab 09/05/2019 1129 08/30/2019 1144 09/28/19 0450 09/29/19 0451 09/30/19 0447  NA 138 137 138 138 139  K 3.2* 3.2* 3.5 3.4* 3.2*  CL 99 101 100 100 100  CO2 25  --  26 23 23   GLUCOSE 97 85 104* 85 76  BUN 26* 23 34* 24* 35*  CREATININE 2.76* 2.80* 3.26* 2.62* 3.55*  CALCIUM 8.5*  --  8.6* 9.1 8.8*   GFR: Estimated Creatinine Clearance: 13.2 mL/min (A) (by C-G formula based on SCr of 3.55 mg/dL (H)). Liver Function Tests: Recent Labs  Lab 09/17/2019 1129 09/28/19 0450  AST 26 18  ALT 6 7  ALKPHOS 70 58  BILITOT 1.6* 1.1  PROT 6.7 5.8*  ALBUMIN 2.1* 1.7*   No results for input(s): LIPASE, AMYLASE in the last 168 hours. No results for input(s): AMMONIA in the last 168 hours. Coagulation Profile: Recent Labs  Lab 08/28/2019 1129  INR 1.4*   Cardiac Enzymes: No results for input(s): CKTOTAL, CKMB, CKMBINDEX, TROPONINI in the last 168 hours. BNP (last 3 results) No results for input(s): PROBNP in the last 8760 hours. HbA1C: No results for input(s): HGBA1C in the last 72 hours. CBG: Recent Labs  Lab 09/28/19 0631 09/28/19 0741 09/28/19 1143  GLUCAP 76 95 107*   Lipid Profile: No results for input(s): CHOL, HDL, LDLCALC, TRIG, CHOLHDL, LDLDIRECT in the last 72 hours. Thyroid Function Tests: No results for input(s): TSH, T4TOTAL, FREET4, T3FREE, THYROIDAB in the last 72 hours. Anemia Panel: No results for input(s): VITAMINB12, FOLATE, FERRITIN, TIBC, IRON, RETICCTPCT in the last 72 hours. Sepsis Labs: Recent Labs  Lab 09/25/2019 1129 09/10/2019 1729 09/03/2019 2041  PROCALCITON  --   --  7.72  LATICACIDVEN 3.4* 1.5 0.9    Recent Results (from the past 240 hour(s))  Blood culture (routine x 2)     Status: Abnormal   Collection Time: 09/10/2019 11:29 AM   Specimen: Right Antecubital; Blood  Result Value Ref Range Status   Specimen Description   Final    RIGHT ANTECUBITAL Performed at [facility], [address] [place], [place] [address]     Special Requests   Final    BOTTLES DRAWN AEROBIC ONLY Blood Culture adequate volume Performed at [facility], [address], [place], [place] [address]    Culture  Setup Time   Final    AEROBIC BOTTLE ONLY GRAM NEGATIVE RODS Gram Stain Report Called to,Read Back By and Verified With: H TETREAULT,RN @0335  09/28/19 MKELLY Organism ID to follow Performed at [facility], [place] [address], [place], [place] [address]    Culture ESCHERICHIA COLI (A)  Final   Report Status 09/30/2019 FINAL  Final  Organism ID, Bacteria ESCHERICHIA COLI  Final      Susceptibility   Escherichia coli - MIC*    AMPICILLIN 8 SENSITIVE Sensitive     CEFAZOLIN <=4 SENSITIVE Sensitive     CEFEPIME <=1 SENSITIVE Sensitive     CEFTAZIDIME <=1 SENSITIVE Sensitive     CEFTRIAXONE <=1 SENSITIVE Sensitive     CIPROFLOXACIN <=0.25 SENSITIVE Sensitive     GENTAMICIN <=1 SENSITIVE Sensitive     IMIPENEM <=0.25 SENSITIVE Sensitive     TRIMETH/SULFA <=20 SENSITIVE Sensitive     AMPICILLIN/SULBACTAM 4 SENSITIVE Sensitive     PIP/TAZO <=4 SENSITIVE Sensitive     * ESCHERICHIA COLI  Blood culture (routine x 2)     Status: Abnormal   Collection Time: 09/18/2019 11:29 AM   Specimen: Right Antecubital; Blood  Result Value Ref Range Status   Specimen Description   Final    RIGHT ANTECUBITAL Performed at [facility], [address], [place], [place] [address]    Special Requests   Final    BOTTLES DRAWN AEROBIC ONLY Blood Culture adequate volume Performed at [place] [place], [place] [address]    Culture  Setup Time   Final    AEROBIC BOTTLE ONLY GRAM NEGATIVE RODS Gram Stain Report Called to,Read Back By and Verified With: J HEARN,RN @0112  09/29/19 MKELLY    Culture (A)  Final    ESCHERICHIA COLI SUSCEPTIBILITIES PERFORMED ON PREVIOUS CULTURE WITHIN THE LAST 5 DAYS. Performed at [facility], [place] [address] [place], [place] [address]    Report Status 10/01/2019 FINAL  Final  Blood  Culture ID Panel (Reflexed)     Status: Abnormal   Collection Time: 09/10/2019 11:29 AM  Result Value Ref Range Status   Enterococcus species NOT DETECTED NOT DETECTED Final   Listeria monocytogenes NOT DETECTED NOT DETECTED Final   Staphylococcus species NOT DETECTED NOT DETECTED Final   Staphylococcus aureus (BCID) NOT DETECTED NOT DETECTED Final   Streptococcus species NOT DETECTED NOT DETECTED Final   Streptococcus agalactiae NOT DETECTED NOT DETECTED Final   Streptococcus pneumoniae NOT DETECTED NOT DETECTED Final   Streptococcus pyogenes NOT DETECTED NOT DETECTED Final   Acinetobacter baumannii NOT DETECTED NOT DETECTED Final   Enterobacteriaceae species DETECTED (A) NOT DETECTED Final    Comment: Enterobacteriaceae represent a large family of gram-negative bacteria, not a single organism. CRITICAL RESULT CALLED TO, READ BACK BY AND VERIFIED WITH: Gloris [place] PharmD 8:45 09/28/19 (wilsonm)    Enterobacter cloacae complex NOT DETECTED NOT DETECTED Final   Escherichia coli DETECTED (A) NOT DETECTED Final    Comment: CRITICAL RESULT CALLED TO, READ BACK BY AND VERIFIED WITH: Gloris [place] PharmD 8:45 09/28/19 ([email or web address])    Klebsiella oxytoca NOT DETECTED NOT DETECTED Final   Klebsiella pneumoniae NOT DETECTED NOT DETECTED Final   Proteus species NOT DETECTED NOT DETECTED Final   Serratia marcescens NOT DETECTED NOT DETECTED Final   Carbapenem resistance NOT DETECTED NOT DETECTED Final   Haemophilus influenzae NOT DETECTED NOT DETECTED Final   Neisseria meningitidis NOT DETECTED NOT DETECTED Final   Pseudomonas aeruginosa NOT DETECTED NOT DETECTED Final   Candida albicans NOT DETECTED NOT DETECTED Final   Candida glabrata NOT DETECTED NOT DETECTED Final   Candida krusei NOT DETECTED NOT DETECTED Final   Candida parapsilosis NOT DETECTED NOT DETECTED Final   Candida tropicalis NOT DETECTED NOT DETECTED Final    Comment: Performed at [facility], [place] [address] [place], [place] [address]  SARS Coronavirus 2 by RT PCR (hospital order, performed in [facility] hospital lab) Nasopharyngeal Nasopharyngeal Swab     Status: None   Collection Time: 09/15/2019 12:29 PM   Specimen: Nasopharyngeal Swab  Result Value Ref Range Status   SARS Coronavirus 2 NEGATIVE NEGATIVE Final    Comment: (NOTE) SARS-CoV-2 target nucleic acids are NOT DETECTED. The SARS-CoV-2 RNA is generally detectable in upper and lower respiratory specimens during the acute phase of infection. The lowest concentration of SARS-CoV-2 viral copies this assay can detect is 250 copies / mL. A negative result does not preclude SARS-CoV-2 infection and should not be used as the sole basis for treatment or other patient management decisions.  A negative result may occur with improper specimen collection / handling, submission of specimen other than nasopharyngeal swab, presence of viral mutation(s) within the areas targeted by this assay, and inadequate number of viral copies (<250 copies / mL). A negative result must be combined with clinical observations, patient history, and epidemiological information. Fact Sheet for Patients:   StrictlyIdeas.no Fact Sheet for Healthcare Providers: BankingDealers.co.za This test is not yet approved or cleared  by the [place] FDA and has been authorized for detection and/or diagnosis of SARS-CoV-2 by FDA under an Emergency Use Authorization (EUA).  This EUA will remain in effect (meaning this test can be used) for the duration of the COVID-19 declaration under Section 564(b)(1) of the Act, 21 U.S.C. section 360bbb-3(b)(1), unless the authorization is terminated or revoked sooner. Performed at [facility], [address], [place], [place] [address]   MRSA PCR Screening     Status: Abnormal   Collection Time: 09/08/2019  6:32 PM   Specimen: Nasal Mucosa; Nasopharyngeal  Result Value Ref Range Status   MRSA by PCR POSITIVE (A)  NEGATIVE Final    Comment:        The GeneXpert MRSA Assay (FDA approved for NASAL specimens only), is one component of a comprehensive MRSA colonization surveillance program. It is not intended to diagnose MRSA infection nor to guide or monitor treatment for MRSA infections. RESULT CALLED TO, READ BACK BY AND VERIFIED WITH: HEARN,RN AT 2155 ON 5.31.21 BY ISLEY,B Performed at [facility], [address], [place], [place] [address]      Radiology Studies: No results found.      Scheduled Meds:  Chlorhexidine Gluconate Cloth  6 each Topical Daily   Chlorhexidine Gluconate Cloth  6 each Topical Q0600   mouth rinse  15 mL Mouth Rinse Q4H   mupirocin ointment   Nasal BID   sodium hypochlorite   Topical BID   Continuous Infusions:    LOS: 6 days    Roxan Hockey, MD [facility]   If 7PM-7AM, please contact night-coverage [email or web address]  10/03/2019, 4:40 PM

## 2019-10-04 NOTE — Care Management Important Message (Signed)
Important Message  Patient Details  Name: Joseph SWIGART Sr. MRN: [id number] Date of Birth: Oct 28, 1940   Medicare Important Message Given:  Yes     Tommy Medal 10/04/2019, 3:02 PM

## 2019-10-04 NOTE — Progress Notes (Signed)
PROGRESS NOTE    [name]  ZJI:[id number] DOB: 14-Feb-1941 DOA: 09/22/2019 PCP: [name], MD    Brief Narrative:  Joseph Funes. is a 79 y.o. 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  He was discharged to skilled nursing facility.  Patient had presented to the dialysis center today for routine dialysis when he reported that he was short of breath.  History is somewhat limited since patient cannot participate.  History is obtained from his daughter.  At the dialysis center, supplemental oxygen was applied, but he continued to be short of breath.  He was sent to the ER for evaluation.  Upon arrival to the emergency room, he was noted to be hypotensive, hypoxic.  Accurate vital signs were proved to be difficult since pulse ox did not have accurate waveform and blood pressure cuff had to be placed on his leg.  Initial blood gas showed pH of 7.46, PCO2 of 33, PO2 of 248 on 100% oxygen.  CBC showed WBC count of 40.3, up from 35.1, from 4 days earlier.  Potassium 3.2, BUN of 26, lactic acid of 3.4.  CT angiogram of the chest was negative for pulmonary embolism, but did show bilateral pleural effusions and left-sided pneumonia.  Patient was bolused with IV fluids per sepsis protocol.  He received intravenous antibiotics.  He was referred for admission. --- Overall prognosis is poor, not tolerating hemodialysis, patient's daughter after further discussions with palliative care team has agreed to transition to full comfort care, however she requested that we hold off on transfer to residential hospice until patient's son is in agreement   Assessment & Plan:   Principal  Problem:   End of life care--- full comfort measures Active Problems:   DNR (do not resuscitate)   Encounter for hospice care discussion--- full comfort care/palliative care   ESRD on dialysis [facility])   PVD (peripheral vascular disease) ([place])   S/P transmetatarsal amputation of foot, right ([place])   Leukocytosis   Acute metabolic encephalopathy   Chronic hypotension   Severe sepsis with septic shock ([place])   Acute respiratory failure with hypoxia ([place])   HCAP (healthcare-associated pneumonia)   Chronic diastolic CHF (congestive heart failure) ([facility])   Bilateral pleural effusion   S/P AKA (above knee amputation), left ([place])   Anemia due to chronic kidney disease   Decubitus ulcer of sacral region, stage 4 ([place])   E. coli sepsis ([place])   1)E coli Severe sepsis with septic shock-  blood cultures from 09/10/2019 with pansensitive E. coli, okay to de-escalate to Ancef on 10/01/2019  -  Lactic acid elevated on admission but has trended down with IV fluids. Cortisol is appropriately elevated,  discontinued stress dose steroids already-------- Overall prognosis is very poor, not tolerating hemodialysis, patient's daughter after further discussions with palliative care team has agreed to transition to full comfort care on 10/01/19, however she requested that we hold off on transfer to residential hospice until patient's son is in agreement -Please see subjective section below  2)Acute respiratory failure with hypoxia.  Related to pneumonia and pleural effusions. Initially on 100% NRB, -continue supplemental oxygen  3)-Full comfort care measures- Overall prognosis is very poor, not tolerating hemodialysis, patient's daughter after further discussions with palliative care team has agreed  to transition to full comfort care, however she requested that we hold off on transfer to residential hospice until patient's son is in agreement -Please see subjective section below   4)Healthcare associated Pneumonia-   patient was treated with vancomycin and meropenem-- -right knee on Ancef for E. coli bacteremia --- Overall prognosis is poor, not tolerating hemodialysis,  transitioned to full comfort care  5)End-stage renal disease on hemodialysis.  Nephrology following.  Patient has been on hemodialysis for over 20 years, -Per nephrologist No further attempts at hemodialysis due to persistent hypotension with hemodialysis sessions  6)Peripheral vascular disease---  S/p left above-the-knee amputation.    Unable to resume on antiplatelet agents due to lethargy and inability to  take oral intake  safely  7)Acute metabolic encephalopathy.  Likely related to sepsis/hypotension. He remains sleepy/lethargic  8)Chronic hypotension.  He is chronically on midodrine.  Resume once mental status has improved and able to tolerate oral intake  9)Large sacral decubitus ulcer, unstageable-POA.  Wound care input appreciated. If patient stabilizes, can consider MRI of sacrum to evaluate for osteomyelitis, although I suspect this will be present considering the degree of the wound. -Initially treated with vancomycin and meropenem, deescalated to Ancef due to E. coli bacteremia -Antibiotics discontinued on 10/01/2019  10)Goals of care.  Palliative care consulted to help assist in goals of care discussions. Palliative care consult appreciated ---Overall prognosis is very poor, not tolerating hemodialysis, patient's daughter after further discussions with palliative care team has agreed to transition to full comfort care, however she requested that we hold off on transfer to residential hospice until patient's son is in agreement -Please see subjective section below   DVT prophylaxis: heparin Code Status: DNR Family Communication: Palliative care services had meeting with family again on 09/30/2019   disposition Plan: Status is: Inpatient--- awaiting family's decision on possible transition to comfort care and hospice  Remains  inpatient appropriate because:Inpatient level of care appropriate due to severity of illness   Dispo: The patient is from: SNF              Anticipated d/c is to: SNF Vs Hospice House              Anticipated d/c date is: 1 or 2 days               Patient currently -Awaiting family decision on transfer to residential hospice house  Consultants:   Palliative care  Nephrology  Wound care  Procedures:     Antimicrobials:   Vancomycin--stopped 10/01/19  Meropenem stopped 10/01/19 Started on ancef 10/01/19,  --Off antibiotics since 10/01/2019  Subjective:  -I called and spoke with patient's son by phone, he is on his way back from [place] -Also called and spoke with patient's daughter [name] she will be visiting later this afternoon -Patient's daughter is open to conversations with hospice and possible transition to residential hospice house, patient son at this time is still reluctant to make the transition to residential hospice  Objective: Vitals:   09/30/19 2254 10/01/19 1826 10/02/19 0509 10/03/19 1537  BP: 97/76 (!) 68/31 (!) 104/52 94/69  Pulse: (!) 49 (!) 163 78 91  Resp: 16  16 18   Temp: 99 F (37.2 C)  98.5 F (36.9 C) 97.6 F (36.4 C)  TempSrc: Oral  Oral Oral  SpO2:    97%  Weight:      Height:        Intake/Output Summary (Last 24 hours) at 10/04/2019 1213 Last data filed  at 10/04/2019 0800 Gross per 24 hour  Intake 0 ml  Output --  Net 0 ml   Filed Weights   09/06/2019 1719 09/28/19 0628 09/28/19 1315  Weight: 51.9 kg 52.6 kg 52.7 kg    Examination:  General exam: somnolent, mumbling from time to time Respiratory system: diminished breath sounds bilaterally. Respiratory effort normal. Cardiovascular system: S1 & S2 heard, RRR. Gastrointestinal system: Abdomen is nondistended, soft and nontender.  Normal bowel sounds heard. Central nervous system: limited exam due to mental status Extremities: LLE AKA, RLE TMA Skin: stage 4 sacral decubitus ulcer.  Incision site of AKA with staples looks clean Psychiatry: somnolent    Data Reviewed:  CBC: Recent Labs  Lab 09/28/19 0450 09/28/19 2308 09/29/19 0616 09/30/19 0447  WBC 42.0*  --  41.5* 36.4*  HGB 7.9* 10.8* 11.5* 11.7*  HCT 24.9* 33.8* 36.0* 37.5*  MCV 65.5*  --  72.0* 72.1*  PLT 293  --  245 416   Basic Metabolic Panel: Recent Labs  Lab 09/28/19 0450 09/29/19 0451 09/30/19 0447  NA 138 138 139  K 3.5 3.4* 3.2*  CL 100 100 100  CO2 26 23 23   GLUCOSE 104* 85 76  BUN 34* 24* 35*  CREATININE 3.26* 2.62* 3.55*  CALCIUM 8.6* 9.1 8.8*   GFR: Estimated Creatinine Clearance: 13.2 mL/min (A) (by C-G formula based on SCr of 3.55 mg/dL (H)). Liver Function Tests: Recent Labs  Lab 09/28/19 0450  AST 18  ALT 7  ALKPHOS 58  BILITOT 1.1  PROT 5.8*  ALBUMIN 1.7*   No results for input(s): LIPASE, AMYLASE in the last 168 hours. No results for input(s): AMMONIA in the last 168 hours. Coagulation Profile: No results for input(s): INR, PROTIME in the last 168 hours. Cardiac Enzymes: No results for input(s): CKTOTAL, CKMB, CKMBINDEX, TROPONINI in the last 168 hours. BNP (last 3 results) No results for input(s): PROBNP in the last 8760 hours. HbA1C: No results for input(s): HGBA1C in the last 72 hours. CBG: Recent Labs  Lab 09/28/19 0631 09/28/19 0741 09/28/19 1143  GLUCAP 76 95 107*   Lipid Profile: No results for input(s): CHOL, HDL, LDLCALC, TRIG, CHOLHDL, LDLDIRECT in the last 72 hours. Thyroid Function Tests: No results for input(s): TSH, T4TOTAL, FREET4, T3FREE, THYROIDAB in the last 72 hours. Anemia Panel: No results for input(s): VITAMINB12, FOLATE, FERRITIN, TIBC, IRON, RETICCTPCT in the last 72 hours. Sepsis Labs: Recent Labs  Lab 09/01/2019 1729 09/10/2019 2041  PROCALCITON  --  7.72  LATICACIDVEN 1.5 0.9    Recent Results (from the past 240 hour(s))  Blood culture (routine x 2)     Status: Abnormal   Collection Time: 09/21/2019 11:29 AM   Specimen:  Right Antecubital; Blood  Result Value Ref Range Status   Specimen Description   Final    RIGHT ANTECUBITAL Performed at [facility], [address] [place], [place] [address]    Special Requests   Final    BOTTLES DRAWN AEROBIC ONLY Blood Culture adequate volume Performed at [facility], [address], [place], [place] [address]    Culture  Setup Time   Final    AEROBIC BOTTLE ONLY GRAM NEGATIVE RODS Gram Stain Report Called to,Read Back By and Verified With: H TETREAULT,RN @0335  09/28/19 MKELLY Organism ID to follow Performed at [facility], [place] [address] [place], [place] [address]    Culture ESCHERICHIA COLI (A)  Final   Report Status 09/30/2019 FINAL  Final   Organism ID, Bacteria  ESCHERICHIA COLI  Final      Susceptibility   Escherichia coli - MIC*    AMPICILLIN 8 SENSITIVE Sensitive     CEFAZOLIN <=4 SENSITIVE Sensitive     CEFEPIME <=1 SENSITIVE Sensitive     CEFTAZIDIME <=1 SENSITIVE Sensitive     CEFTRIAXONE <=1 SENSITIVE Sensitive     CIPROFLOXACIN <=0.25 SENSITIVE Sensitive     GENTAMICIN <=1 SENSITIVE Sensitive     IMIPENEM <=0.25 SENSITIVE Sensitive     TRIMETH/SULFA <=20 SENSITIVE Sensitive     AMPICILLIN/SULBACTAM 4 SENSITIVE Sensitive     PIP/TAZO <=4 SENSITIVE Sensitive     * ESCHERICHIA COLI  Blood culture (routine x 2)     Status: Abnormal   Collection Time: 09/05/2019 11:29 AM   Specimen: Right Antecubital; Blood  Result Value Ref Range Status   Specimen Description   Final    RIGHT ANTECUBITAL Performed at [facility], [address], [place], [place] [address]    Special Requests   Final    BOTTLES DRAWN AEROBIC ONLY Blood Culture adequate volume Performed at [facility], [address], [place], [place] [address]    Culture  Setup Time   Final    AEROBIC BOTTLE ONLY GRAM NEGATIVE RODS Gram Stain Report Called to,Read Back By and Verified With: J HEARN,RN @0112  09/29/19 MKELLY    Culture (A)  Final    ESCHERICHIA  COLI SUSCEPTIBILITIES PERFORMED ON PREVIOUS CULTURE WITHIN THE LAST 5 DAYS. Performed at [facility], [place] [address], [place], [place] [address]    Report Status 10/01/2019 FINAL  Final  Blood Culture ID Panel (Reflexed)     Status: Abnormal   Collection Time: 09/13/2019 11:29 AM  Result Value Ref Range Status   Enterococcus species NOT DETECTED NOT DETECTED Final   Listeria monocytogenes NOT DETECTED NOT DETECTED Final   Staphylococcus species NOT DETECTED NOT DETECTED Final   Staphylococcus aureus (BCID) NOT DETECTED NOT DETECTED Final   Streptococcus species NOT DETECTED NOT DETECTED Final   Streptococcus agalactiae NOT DETECTED NOT DETECTED Final   Streptococcus pneumoniae NOT DETECTED NOT DETECTED Final   Streptococcus pyogenes NOT DETECTED NOT DETECTED Final   Acinetobacter baumannii NOT DETECTED NOT DETECTED Final   Enterobacteriaceae species DETECTED (A) NOT DETECTED Final    Comment: Enterobacteriaceae represent a large family of gram-negative bacteria, not a single organism. CRITICAL RESULT CALLED TO, READ BACK BY AND VERIFIED WITH: Gloris Manchester PharmD 8:45 09/28/19 (wilsonm)    Enterobacter cloacae complex NOT DETECTED NOT DETECTED Final   Escherichia coli DETECTED (A) NOT DETECTED Final    Comment: CRITICAL RESULT CALLED TO, READ BACK BY AND VERIFIED WITH: Gloris Manchester PharmD 8:45 09/28/19 ([email or web address])    Klebsiella oxytoca NOT DETECTED NOT DETECTED Final   Klebsiella pneumoniae NOT DETECTED NOT DETECTED Final   Proteus species NOT DETECTED NOT DETECTED Final   Serratia marcescens NOT DETECTED NOT DETECTED Final   Carbapenem resistance NOT DETECTED NOT DETECTED Final   Haemophilus influenzae NOT DETECTED NOT DETECTED Final   Neisseria meningitidis NOT DETECTED NOT DETECTED Final   Pseudomonas aeruginosa NOT DETECTED NOT DETECTED Final   Candida albicans NOT DETECTED NOT DETECTED Final   Candida glabrata NOT DETECTED NOT DETECTED Final   Candida krusei NOT DETECTED NOT DETECTED  Final   Candida parapsilosis NOT DETECTED NOT DETECTED Final   Candida tropicalis NOT DETECTED NOT DETECTED Final    Comment: Performed at [facility], [place] [address], [place], [place] [address]  SARS Coronavirus  2 by RT PCR (hospital order, performed in [facility] hospital lab) Nasopharyngeal Nasopharyngeal Swab     Status: None   Collection Time: 09/22/2019 12:29 PM   Specimen: Nasopharyngeal Swab  Result Value Ref Range Status   SARS Coronavirus 2 NEGATIVE NEGATIVE Final    Comment: (NOTE) SARS-CoV-2 target nucleic acids are NOT DETECTED. The SARS-CoV-2 RNA is generally detectable in upper and lower respiratory specimens during the acute phase of infection. The lowest concentration of SARS-CoV-2 viral copies this assay can detect is 250 copies / mL. A negative result does not preclude SARS-CoV-2 infection and should not be used as the sole basis for treatment or other patient management decisions.  A negative result may occur with improper specimen collection / handling, submission of specimen other than nasopharyngeal swab, presence of viral mutation(s) within the areas targeted by this assay, and inadequate number of viral copies (<250 copies / mL). A negative result must be combined with clinical observations, patient history, and epidemiological information. Fact Sheet for Patients:   StrictlyIdeas.no Fact Sheet for Healthcare Providers: BankingDealers.co.za This test is not yet approved or cleared  by the [place] FDA and has been authorized for detection and/or diagnosis of SARS-CoV-2 by FDA under an Emergency Use Authorization (EUA).  This EUA will remain in effect (meaning this test can be used) for the duration of the COVID-19 declaration under Section 564(b)(1) of the Act, 21 U.S.C. section 360bbb-3(b)(1), unless the authorization is terminated or revoked sooner. Performed at [facility], [address]  [place], [place] [address]   MRSA PCR Screening     Status: Abnormal   Collection Time: 09/04/2019  6:32 PM   Specimen: Nasal Mucosa; Nasopharyngeal  Result Value Ref Range Status   MRSA by PCR POSITIVE (A) NEGATIVE Final    Comment:        The GeneXpert MRSA Assay (FDA approved for NASAL specimens only), is one component of a comprehensive MRSA colonization surveillance program. It is not intended to diagnose MRSA infection nor to guide or monitor treatment for MRSA infections. RESULT CALLED TO, READ BACK BY AND VERIFIED WITH: HEARN,RN AT 2155 ON 5.31.21 BY ISLEY,B Performed at [facility], [address], [place], [place] [address]      Radiology Studies: No results found.      Scheduled Meds: . Chlorhexidine Gluconate Cloth  6 each Topical Daily  . Chlorhexidine Gluconate Cloth  6 each Topical Q0600  . mouth rinse  15 mL Mouth Rinse Q4H   Continuous Infusions:    LOS: 7 days    Roxan Hockey, MD [facility]   If 7PM-7AM, please contact night-coverage [email or web address]  10/04/2019, 12:13 PM

## 2019-10-04 NOTE — Progress Notes (Signed)
-  I called and spoke with patient's son by phone, he is on his way back from [place] -Also called and spoke with patient's daughter [name] she will be visiting later this afternoon -Patient's daughter is open to conversations with hospice and possible transition to residential hospice house, patient son at this time is still reluctant to make the transition to residential hospice  =-Palliative care practitioner is following  Roxan Hockey, MD

## 2019-10-04 NOTE — Plan of Care (Signed)

## 2019-10-26 ENCOUNTER — Ambulatory Visit: Payer: Medicare Other | Admitting: Student

## 2019-10-28 NOTE — Discharge Summary (Signed)
Joseph MASCIO Sr. AOZ:[id number] DOB: 1940/11/01 DOA: 2019-10-10  PCP: [name], MD PCP/Office notified:  Admit date: 2019/10/10 Date of Death: 18-Oct-2019  Final Diagnoses:  Principal Problem:   End of life care--- full comfort measures Active Problems:   DNR (do not resuscitate)   Encounter for hospice care discussion--- full comfort care/palliative care   ESRD on dialysis [facility])   PVD (peripheral vascular disease) ([place])   S/P transmetatarsal amputation of foot, right ([place])   Leukocytosis   Acute metabolic encephalopathy   Chronic hypotension   Severe sepsis with septic shock ([place])   Acute respiratory failure with hypoxia ([place])   HCAP (healthcare-associated pneumonia)   Chronic diastolic CHF (congestive heart failure) ([facility])   Bilateral pleural effusion   S/P AKA (above knee amputation), left ([place])   Anemia due to chronic kidney disease   Decubitus ulcer of sacral region, stage 4 ([place])   E. coli sepsis ([place])  History of present illness:  Chief Complaint: Shortness of breath  HPI: [name]. is a 79 y.o. 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  He was discharged to skilled nursing facility.  Patient had presented to the dialysis center today for routine dialysis when he reported that he was short of breath.  History is somewhat limited since patient cannot participate.  History is obtained from his daughter.  At the dialysis center, supplemental oxygen was applied, but he continued to be short of breath.  He was sent to the ER for evaluation.  Upon arrival to the emergency room, he was noted to be hypotensive, hypoxic.  Accurate vital signs were proved to be  difficult since pulse ox did not have accurate waveform and blood pressure cuff had to be placed on his leg.  Initial blood gas showed pH of 7.46, PCO2 of 33, PO2 of 248 on 100% oxygen.  CBC showed WBC count of 40.3, up from 35.1, from 4 days earlier.  Potassium 3.2, BUN of 26, lactic acid of 3.4.  CT angiogram of the chest was negative for pulmonary embolism, but did show bilateral pleural effusions and left-sided pneumonia.  Patient was bolused with IV fluids per sepsis protocol.  He received intravenous antibiotics.  He was referred for admission.  Review of Systems: Unable to assess due to mental status  Hospital Course:   Brief Narrative:  Joseph Middletonis a 79 y.o.malewith medical history significant ofend-stage renal disease on hemodialysis, peripheral vascular disease status post left above-the-knee imitation/right transmetatarsal amputation, chronic diastolic congestive heart failure, chronic hypotension on midodrine, who was recently discharged from the hospital on 5/27after undergoing left lower extremity amputation. He was discharged to skilled nursing facility. Patient had presented to the dialysis center today for routine dialysis when he reported that he was short of breath. History is somewhat limited since patient cannot participate. History is obtained from his daughter. At the dialysis center, supplemental oxygen was applied, but he continued to be short of breath. He was sent to the ER for evaluation. Upon arrival to the emergency room, he was noted to be hypotensive, hypoxic. Accurate vital signs were proved to be difficult since pulse ox did not have accurate waveform and blood pressure cuff had to be placed on his leg. Initial blood gas showed pH of  7.46, PCO2 of 33, PO2 of 248 on 100% oxygen. CBC showed WBC count of 40.3, up from 35.1, from4 days earlier. Potassium 3.2, BUN of 26, lactic acid of 3.4. CT angiogram of the chest was negative for pulmonary embolism, but  did show bilateral pleural effusions and left-sided pneumonia. Patient was bolused with IV fluids per sepsis protocol. He received intravenous antibiotics. He was referred for admission. --- Overall prognosis was grave, he was not tolerating hemodialysis, patient's daughter after further discussions with palliative care team agreed to transition to full comfort care, however she requested that we hold off on transfer to residential hospice until patient's son is in agreement with residential hospice transfer -  patient expired on  2019/10/31 at 40am in the hospital while awaiting decision on transfer to residential hospice   Assessment & Plan:   Principal Problem:   End of life care--- full comfort measures Active Problems:   DNR (do not resuscitate)   Encounter for hospice care discussion--- full comfort care/palliative care   ESRD on dialysis [facility])   PVD (peripheral vascular disease) ([place])   S/P transmetatarsal amputation of foot, right ([place])   Leukocytosis   Acute metabolic encephalopathy   Chronic hypotension   Severe sepsis with septic shock ([place])   Acute respiratory failure with hypoxia ([place])   HCAP (healthcare-associated pneumonia)   Chronic diastolic CHF (congestive heart failure) ([facility])   Bilateral pleural effusion   S/P AKA (above knee amputation), left ([place])   Anemia due to chronic kidney disease   Decubitus ulcer of sacral region, stage 4 ([place])   E. coli sepsis ([place])   1)E coli Severe sepsis with septic shock- blood cultures from 09/06/2019 with pansensitive E. coli, okay to de-escalate to Ancef on 10/01/2019  - Lactic acid elevated on admission but has trended down with IV fluids.Cortisol is appropriately elevated,  discontinued stress dose steroids already-------- - Overall prognosis was grave, he was not tolerating hemodialysis, patient's daughter after further discussions with palliative care team agreed to transition to full comfort care, however she requested  that we hold off on transfer to residential hospice until patient's son is in agreement with residential hospice transfer -  patient expired on  2019-10-31 at 0305am in the hospital while awaiting decision on transfer to residential hospice  2)Acute respiratory failure with hypoxia. Related to pneumonia and pleural effusions. Initially on 100% NRB, -   3)-Full comfort care measures- - Overall prognosis was grave, he was not tolerating hemodialysis, patient's daughter after further discussions with palliative care team agreed to transition to full comfort care, however she requested that we hold off on transfer to residential hospice until patient's son is in agreement with residential hospice transfer -  patient expired on  Oct 31, 2019 at 71am in the hospital while awaiting decision on transfer to residential hospice  4)Healthcare associated Pneumonia- patient was treated with vancomycin and meropenem--  treated with Ancef for E. coli bacteremia   5)End-stage renal disease on hemodialysis. Nephrology following.  Patient has been on hemodialysis for over 20 years, -Per nephrologist No further attempts at hemodialysis due to persistent hypotension with hemodialysis sessions  6)Peripheral vascular disease--- S/p left above-the-knee amputation.     7)Acute metabolic encephalopathy. Likely related to sepsis/hypotension.    8)Chronic hypotension. He is chronically on midodrine.   9)Large sacral decubitus ulcer, unstageable-POA. Wound care input appreciated. If patient stabilizes, can consider MRI of sacrum to evaluate for osteomyelitis, although I suspect this will be present considering the degree of the wound. -  Initially treated with vancomycin and meropenem, deescalated to Ancef due to E. coli bacteremia -Antibiotics discontinued on 10/01/2019  10)Goals of care. Palliative care consulted to help assist in goals of care discussions. Palliative care consult appreciated -  Overall prognosis was grave, he was not tolerating hemodialysis, patient's daughter after further discussions with palliative care team agreed to transition to full comfort care, however she requested that we hold off on transfer to residential hospice until patient's son is in agreement with residential hospice transfer -  patient expired on  2019/10/16 at 0305am in the hospital while awaiting decision on transfer to residential hospice  Code Status: DNR  Consultants:   Palliative care  Nephrology  Wound care  Procedures:     Antimicrobials:   Vancomycin--stopped 10/01/19  Meropenem stopped 10/01/19 Started on ancef 10/01/19,  --Off antibiotics since 10/01/2019  Time: patient expired on  2019-10-16 at 0305am  -- - Overall prognosis was grave, he was not tolerating hemodialysis, patient's daughter after further discussions with palliative care team agreed to transition to full comfort care, however she requested that we hold off on transfer to residential hospice until patient's son is in agreement with residential hospice transfer -  patient expired on  2019/10/16 at 0305am in the hospital while awaiting decision on transfer to residential hospice  Signed:  Roxan Hockey  [facility] 2019-10-16, 7:48 AM

## 2019-10-28 NOTE — Progress Notes (Addendum)
Patient was in room.  Patient had no respirations or pulse.  Two RNs present at time of patient expiration. (October 26, 2019 at 0305).  Notified family ( daughter Joanell Rising) and notified MD on call (Dr. Crissie Sickles).

## 2019-10-28 DEATH — deceased

## 2020-03-06 ENCOUNTER — Ambulatory Visit: Payer: Medicare Other | Admitting: Cardiology

## 2021-02-06 IMAGING — US IR FLUORO GUIDE CV LINE*R*
1 series · 3 of 3 positions shown · non-contrast
Comparison: none

INDICATION: 78-year-old male with a history of renal failure referred for
hemodialysis catheter placement

[Series 1: ir fluoro guide cv line*right* · 3 of 3 slices shown]
[im 1/3]
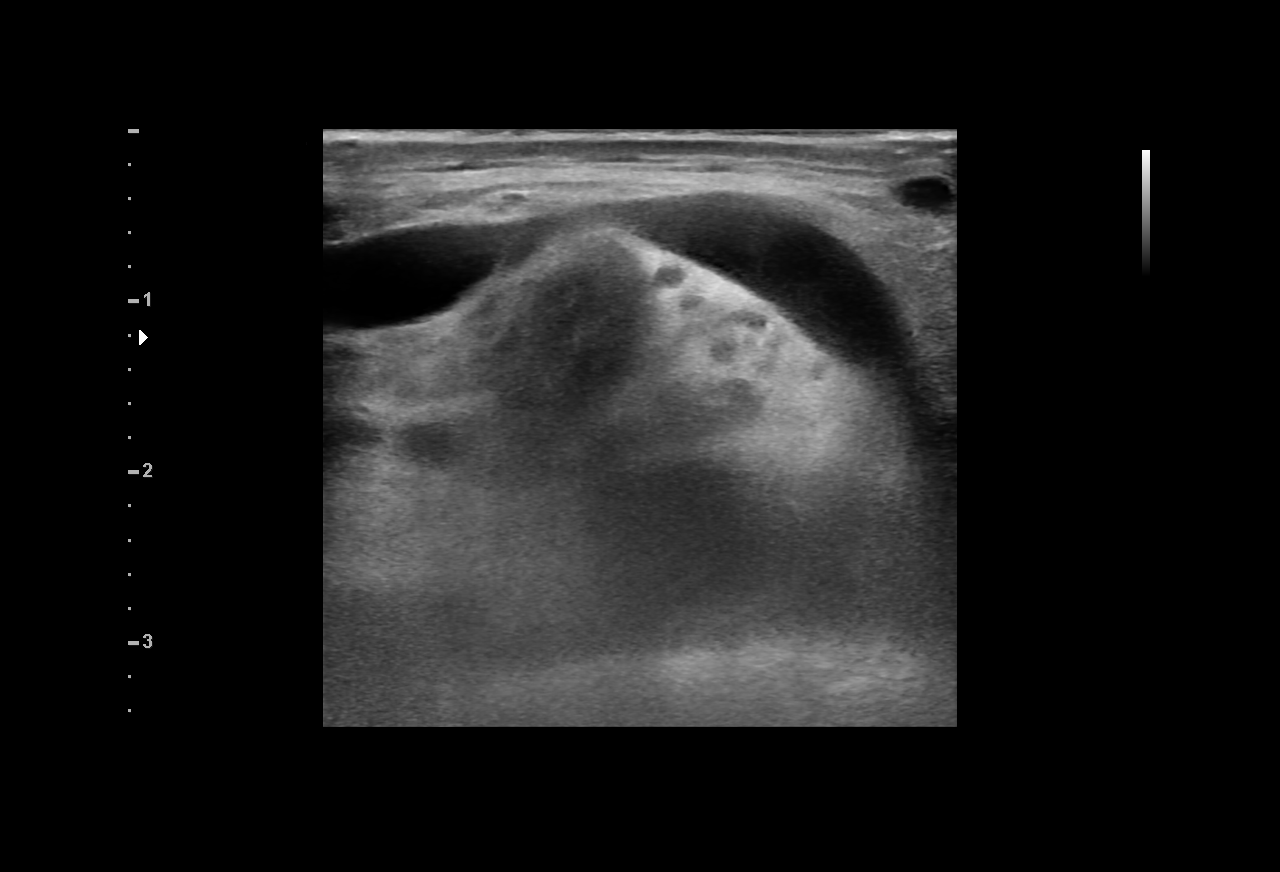
[im 2/3]
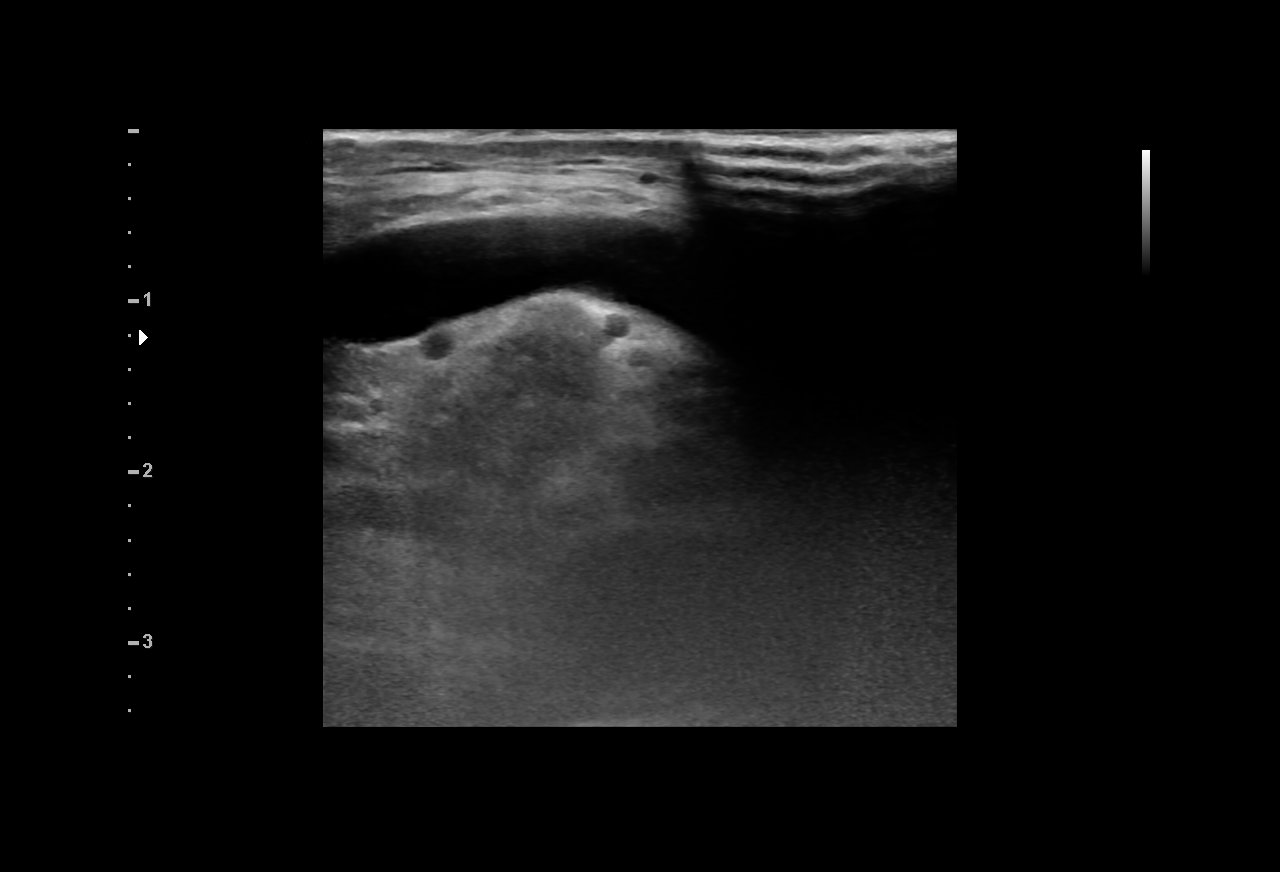
[im 3/3]
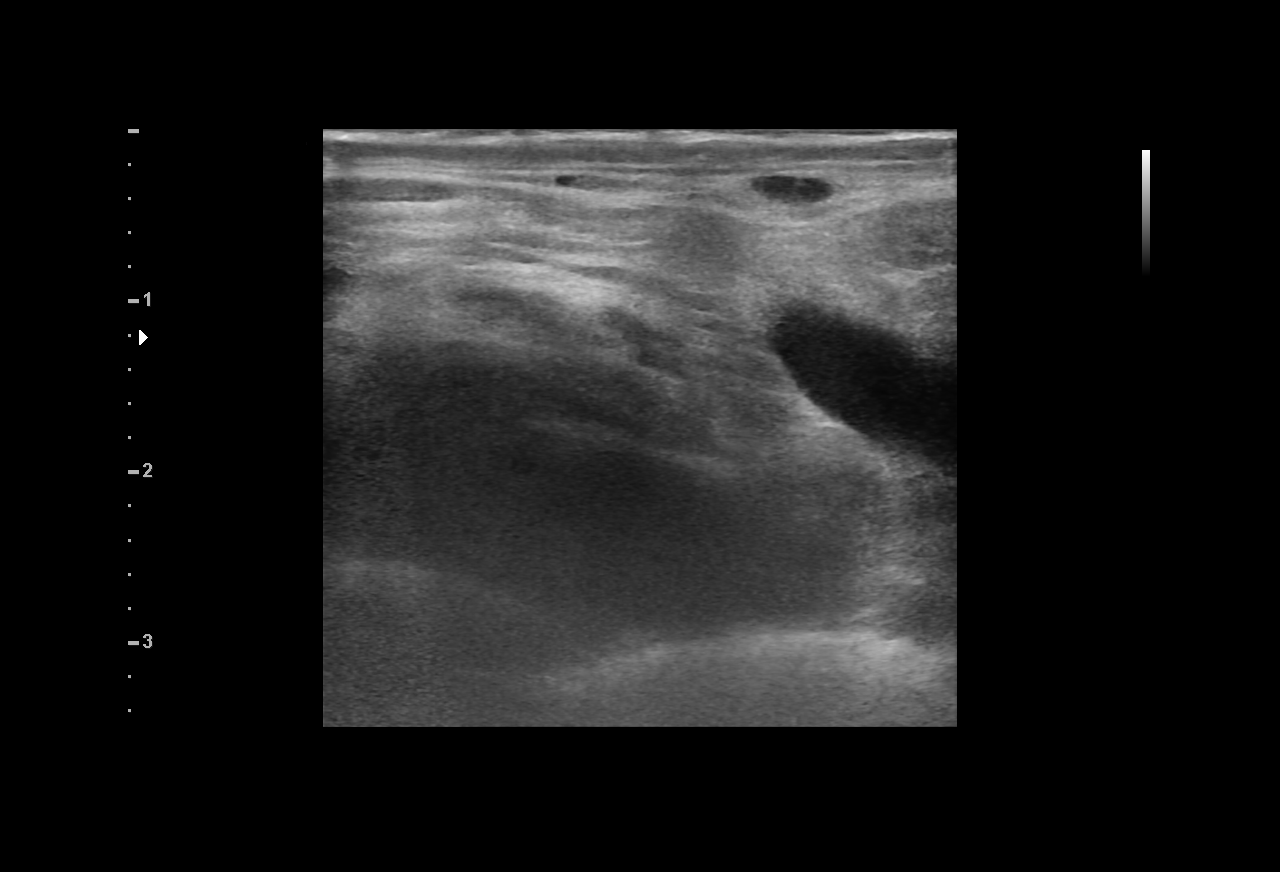

[3 of 3 positions shown; findings below may reference images not displayed]

EXAM:
TUNNELED CENTRAL VENOUS HEMODIALYSIS CATHETER PLACEMENT WITH
ULTRASOUND AND FLUOROSCOPIC GUIDANCE

MEDICATIONS:
2 g Ancef. The antibiotic was given in an appropriate time interval
prior to skin puncture.

ANESTHESIA/SEDATION:
Moderate (conscious) sedation was employed during this procedure. A
total of Versed 0 mg and Fentanyl 50 mcg was administered
intravenously.

Moderate Sedation Time: 0 minutes. The patient's level of
consciousness and vital signs were monitored continuously by
radiology nursing throughout the procedure under my direct
supervision.

FLUOROSCOPY TIME:  Fluoroscopy Time: 0 minutes 18 seconds (3 mGy).

COMPLICATIONS:
None



Ultrasound survey was performed. Right external jugular vein was
patent. Right internal jugular vein is occluded at the inlet.

Micropuncture kit was utilized to access the right external jugular
vein under direct, real-time ultrasound guidance after the overlying
soft tissues were anesthetized with 1% lidocaine with epinephrine.
Stab incision was made with 11 blade scalpel. Microwire was passed
centrally. The microwire was then marked to measure appropriate
internal catheter length. External tunneled length was estimated. A
total tip to cuff length of 23 cm was selected.

035 guidewire was advanced to the level of the IVC.

Skin and subcutaneous tissues of chest wall below the clavicle were
generously infiltrated with 1% lidocaine for local anesthesia. A
small stab incision was made with 11 blade scalpel. The selected
hemodialysis catheter was tunneled in a retrograde fashion from the
anterior chest wall to the venotomy incision.

Serial dilation was performed and then a peel-away sheath was
placed.

The catheter was then placed through the peel-away sheath with tips
ultimately positioned within the superior aspect of the right
atrium. Final catheter positioning was confirmed and documented with
a spot radiographic image. The catheter aspirates and flushes
normally. The catheter was flushed with appropriate volume heparin
dwells.

The catheter exit site was secured with a 0-Prolene retention
suture. Gel-Foam slurry was infused into the soft tissue tract.

The venotomy incision was closed Derma bond and sterile dressing.
Dressings were applied at the chest wall.

Patient tolerated the procedure well and remained hemodynamically
stable throughout.

No complications were encountered and no significant blood loss
encountered.
IMPRESSION: Status post image guided placement of right external jugular
tunneled hemodialysis catheter. Catheter ready for use.
